# Patient Record
Sex: Female | Born: 1950 | Race: White | Hispanic: No | Smoking: Former smoker
Health system: Southern US, Community
[De-identification: ages and names within clinical notes are randomized; demographics above are authoritative.]

## PROBLEM LIST (undated history)

## (undated) DIAGNOSIS — I10 Essential (primary) hypertension: Secondary | ICD-10-CM

## (undated) DIAGNOSIS — T451X5A Adverse effect of antineoplastic and immunosuppressive drugs, initial encounter: Secondary | ICD-10-CM

## (undated) DIAGNOSIS — C3490 Malignant neoplasm of unspecified part of unspecified bronchus or lung: Secondary | ICD-10-CM

## (undated) DIAGNOSIS — D649 Anemia, unspecified: Secondary | ICD-10-CM

## (undated) DIAGNOSIS — B370 Candidal stomatitis: Secondary | ICD-10-CM

## (undated) DIAGNOSIS — E119 Type 2 diabetes mellitus without complications: Secondary | ICD-10-CM

## (undated) DIAGNOSIS — M797 Fibromyalgia: Secondary | ICD-10-CM

## (undated) DIAGNOSIS — E876 Hypokalemia: Secondary | ICD-10-CM

## (undated) DIAGNOSIS — M47812 Spondylosis without myelopathy or radiculopathy, cervical region: Secondary | ICD-10-CM

## (undated) DIAGNOSIS — Z87891 Personal history of nicotine dependence: Secondary | ICD-10-CM

## (undated) DIAGNOSIS — I639 Cerebral infarction, unspecified: Secondary | ICD-10-CM

## (undated) DIAGNOSIS — Z8701 Personal history of pneumonia (recurrent): Secondary | ICD-10-CM

## (undated) DIAGNOSIS — D6481 Anemia due to antineoplastic chemotherapy: Secondary | ICD-10-CM

## (undated) HISTORY — DX: Hypokalemia: E87.6

## (undated) HISTORY — DX: Personal history of pneumonia (recurrent): Z87.01

## (undated) HISTORY — DX: Malignant neoplasm of unspecified part of unspecified bronchus or lung: C34.90

## (undated) HISTORY — DX: Anemia due to antineoplastic chemotherapy: D64.81

## (undated) HISTORY — DX: Fibromyalgia: M79.7

## (undated) HISTORY — DX: Spondylosis without myelopathy or radiculopathy, cervical region: M47.812

## (undated) HISTORY — DX: Adverse effect of antineoplastic and immunosuppressive drugs, initial encounter: T45.1X5A

## (undated) HISTORY — PX: PORTACATH PLACEMENT: SHX2246

## (undated) HISTORY — DX: Personal history of nicotine dependence: Z87.891

## (undated) HISTORY — DX: Candidal stomatitis: B37.0

## (undated) HISTORY — DX: Anemia, unspecified: D64.9

---

## 1988-01-08 HISTORY — PX: KNEE SURGERY: SHX244

## 1993-01-07 HISTORY — PX: CERVICAL LAMINECTOMY: SHX94

## 1998-10-30 ENCOUNTER — Other Ambulatory Visit: Admission: RE | Admit: 1998-10-30 | Discharge: 1998-10-30 | Payer: Self-pay | Admitting: Obstetrics and Gynecology

## 2005-10-14 ENCOUNTER — Inpatient Hospital Stay (HOSPITAL_COMMUNITY): Admission: EM | Admit: 2005-10-14 | Discharge: 2005-10-23 | Payer: Self-pay | Admitting: Emergency Medicine

## 2005-10-14 ENCOUNTER — Ambulatory Visit: Payer: Self-pay | Admitting: Hospitalist

## 2005-10-16 ENCOUNTER — Ambulatory Visit: Payer: Self-pay | Admitting: Internal Medicine

## 2005-10-16 ENCOUNTER — Encounter (INDEPENDENT_AMBULATORY_CARE_PROVIDER_SITE_OTHER): Payer: Self-pay | Admitting: *Deleted

## 2005-10-17 ENCOUNTER — Encounter: Payer: Self-pay | Admitting: Hospitalist

## 2005-10-17 ENCOUNTER — Ambulatory Visit: Payer: Self-pay | Admitting: Internal Medicine

## 2005-10-18 ENCOUNTER — Encounter: Payer: Self-pay | Admitting: Internal Medicine

## 2005-10-24 ENCOUNTER — Ambulatory Visit: Admission: RE | Admit: 2005-10-24 | Discharge: 2006-01-22 | Payer: Self-pay | Admitting: *Deleted

## 2005-11-04 LAB — CBC WITH DIFFERENTIAL/PLATELET
BASO%: 0.3 % (ref 0.0–2.0)
Basophils Absolute: 0 10*3/uL (ref 0.0–0.1)
EOS%: 0 % (ref 0.0–7.0)
HCT: 37.3 % (ref 34.8–46.6)
HGB: 12.8 g/dL (ref 11.6–15.9)
LYMPH%: 5.5 % — ABNORMAL LOW (ref 14.0–48.0)
MCH: 28.5 pg (ref 26.0–34.0)
MCHC: 34.4 g/dL (ref 32.0–36.0)
MCV: 82.7 fL (ref 81.0–101.0)
MONO%: 5.6 % (ref 0.0–13.0)
NEUT%: 88.6 % — ABNORMAL HIGH (ref 39.6–76.8)

## 2005-11-04 LAB — COMPREHENSIVE METABOLIC PANEL
ALT: 29 U/L (ref 0–40)
AST: 15 U/L (ref 0–37)
Alkaline Phosphatase: 68 U/L (ref 39–117)
BUN: 22 mg/dL (ref 6–23)
Calcium: 9.5 mg/dL (ref 8.4–10.5)
Creatinine, Ser: 0.59 mg/dL (ref 0.40–1.20)
Total Bilirubin: 0.4 mg/dL (ref 0.3–1.2)

## 2005-11-11 LAB — COMPREHENSIVE METABOLIC PANEL
ALT: 30 U/L (ref 0–35)
AST: 13 U/L (ref 0–37)
Calcium: 9.4 mg/dL (ref 8.4–10.5)
Chloride: 100 mEq/L (ref 96–112)
Creatinine, Ser: 0.44 mg/dL (ref 0.40–1.20)
Sodium: 137 mEq/L (ref 135–145)
Total Bilirubin: 0.3 mg/dL (ref 0.3–1.2)
Total Protein: 6.4 g/dL (ref 6.0–8.3)

## 2005-11-11 LAB — CBC WITH DIFFERENTIAL/PLATELET
BASO%: 0.4 % (ref 0.0–2.0)
EOS%: 0 % (ref 0.0–7.0)
HCT: 35.7 % (ref 34.8–46.6)
MCH: 28.3 pg (ref 26.0–34.0)
MCHC: 33.9 g/dL (ref 32.0–36.0)
NEUT%: 84.5 % — ABNORMAL HIGH (ref 39.6–76.8)
RBC: 4.28 10*6/uL (ref 3.70–5.32)
RDW: 16 % — ABNORMAL HIGH (ref 11.3–14.5)
WBC: 11.6 10*3/uL — ABNORMAL HIGH (ref 3.9–10.0)
lymph#: 0.6 10*3/uL — ABNORMAL LOW (ref 0.9–3.3)

## 2005-11-18 LAB — CBC WITH DIFFERENTIAL/PLATELET
BASO%: 0.6 % (ref 0.0–2.0)
EOS%: 0.1 % (ref 0.0–7.0)
HCT: 37.1 % (ref 34.8–46.6)
LYMPH%: 5.1 % — ABNORMAL LOW (ref 14.0–48.0)
MCH: 28.6 pg (ref 26.0–34.0)
MCHC: 33.7 g/dL (ref 32.0–36.0)
NEUT%: 85 % — ABNORMAL HIGH (ref 39.6–76.8)
Platelets: 434 10*3/uL — ABNORMAL HIGH (ref 145–400)

## 2005-11-18 LAB — COMPREHENSIVE METABOLIC PANEL
ALT: 23 U/L (ref 0–35)
AST: 9 U/L (ref 0–37)
Alkaline Phosphatase: 63 U/L (ref 39–117)
Creatinine, Ser: 0.43 mg/dL (ref 0.40–1.20)
Total Bilirubin: 0.2 mg/dL — ABNORMAL LOW (ref 0.3–1.2)

## 2005-12-05 ENCOUNTER — Ambulatory Visit (HOSPITAL_COMMUNITY): Admission: RE | Admit: 2005-12-05 | Discharge: 2005-12-05 | Payer: Self-pay | Admitting: Internal Medicine

## 2005-12-05 ENCOUNTER — Ambulatory Visit: Payer: Self-pay | Admitting: Internal Medicine

## 2005-12-09 LAB — CBC WITH DIFFERENTIAL/PLATELET
Basophils Absolute: 0.1 10*3/uL (ref 0.0–0.1)
EOS%: 0.1 % (ref 0.0–7.0)
HCT: 39.6 % (ref 34.8–46.6)
HGB: 13.5 g/dL (ref 11.6–15.9)
MCH: 30.7 pg (ref 26.0–34.0)
MCHC: 34.1 g/dL (ref 32.0–36.0)
MCV: 89.8 fL (ref 81.0–101.0)
MONO%: 6.9 % (ref 0.0–13.0)
NEUT%: 87.4 % — ABNORMAL HIGH (ref 39.6–76.8)

## 2005-12-09 LAB — COMPREHENSIVE METABOLIC PANEL
AST: 13 U/L (ref 0–37)
Alkaline Phosphatase: 60 U/L (ref 39–117)
BUN: 25 mg/dL — ABNORMAL HIGH (ref 6–23)
Creatinine, Ser: 0.45 mg/dL (ref 0.40–1.20)
Total Bilirubin: 0.3 mg/dL (ref 0.3–1.2)

## 2005-12-12 LAB — CBC WITH DIFFERENTIAL/PLATELET
Basophils Absolute: 0 10*3/uL (ref 0.0–0.1)
EOS%: 0.1 % (ref 0.0–7.0)
HCT: 37.7 % (ref 34.8–46.6)
HGB: 12.6 g/dL (ref 11.6–15.9)
LYMPH%: 6.8 % — ABNORMAL LOW (ref 14.0–48.0)
MCH: 30.1 pg (ref 26.0–34.0)
MCV: 89.7 fL (ref 81.0–101.0)
NEUT%: 88.9 % — ABNORMAL HIGH (ref 39.6–76.8)
Platelets: 283 10*3/uL (ref 145–400)
lymph#: 0.6 10*3/uL — ABNORMAL LOW (ref 0.9–3.3)

## 2005-12-12 LAB — COMPREHENSIVE METABOLIC PANEL
AST: 17 U/L (ref 0–37)
BUN: 19 mg/dL (ref 6–23)
Calcium: 9.4 mg/dL (ref 8.4–10.5)
Chloride: 99 mEq/L (ref 96–112)
Creatinine, Ser: 0.47 mg/dL (ref 0.40–1.20)

## 2005-12-19 LAB — COMPREHENSIVE METABOLIC PANEL
ALT: 28 U/L (ref 0–35)
AST: 12 U/L (ref 0–37)
Albumin: 4.1 g/dL (ref 3.5–5.2)
CO2: 23 mEq/L (ref 19–32)
Calcium: 9.4 mg/dL (ref 8.4–10.5)
Chloride: 99 mEq/L (ref 96–112)
Potassium: 4 mEq/L (ref 3.5–5.3)
Sodium: 136 mEq/L (ref 135–145)
Total Protein: 6.3 g/dL (ref 6.0–8.3)

## 2005-12-19 LAB — CBC WITH DIFFERENTIAL/PLATELET
BASO%: 0.2 % (ref 0.0–2.0)
EOS%: 0 % (ref 0.0–7.0)
HCT: 38.1 % (ref 34.8–46.6)
MCH: 30.7 pg (ref 26.0–34.0)
MCHC: 33.8 g/dL (ref 32.0–36.0)
MONO#: 0.4 10*3/uL (ref 0.1–0.9)
RDW: 23.1 % — ABNORMAL HIGH (ref 11.3–14.5)
WBC: 24.2 10*3/uL — ABNORMAL HIGH (ref 3.9–10.0)
lymph#: 1.1 10*3/uL (ref 0.9–3.3)

## 2005-12-26 ENCOUNTER — Inpatient Hospital Stay (HOSPITAL_COMMUNITY): Admission: EM | Admit: 2005-12-26 | Discharge: 2005-12-30 | Payer: Self-pay | Admitting: Emergency Medicine

## 2005-12-26 ENCOUNTER — Ambulatory Visit: Payer: Self-pay | Admitting: Internal Medicine

## 2005-12-27 ENCOUNTER — Ambulatory Visit: Payer: Self-pay | Admitting: Oncology

## 2006-01-07 HISTORY — PX: OTHER SURGICAL HISTORY: SHX169

## 2006-01-08 ENCOUNTER — Encounter (INDEPENDENT_AMBULATORY_CARE_PROVIDER_SITE_OTHER): Payer: Self-pay | Admitting: Specialist

## 2006-01-08 ENCOUNTER — Ambulatory Visit (HOSPITAL_COMMUNITY): Admission: RE | Admit: 2006-01-08 | Discharge: 2006-01-08 | Payer: Self-pay | Admitting: Internal Medicine

## 2006-01-09 LAB — CBC WITH DIFFERENTIAL/PLATELET
Basophils Absolute: 0.1 10*3/uL (ref 0.0–0.1)
EOS%: 0.2 % (ref 0.0–7.0)
HCT: 30.3 % — ABNORMAL LOW (ref 34.8–46.6)
HGB: 10.3 g/dL — ABNORMAL LOW (ref 11.6–15.9)
LYMPH%: 11.1 % — ABNORMAL LOW (ref 14.0–48.0)
MCH: 29.8 pg (ref 26.0–34.0)
MCV: 87.5 fL (ref 81.0–101.0)
NEUT%: 83.5 % — ABNORMAL HIGH (ref 39.6–76.8)
Platelets: 368 10*3/uL (ref 145–400)
lymph#: 1.8 10*3/uL (ref 0.9–3.3)

## 2006-01-16 LAB — COMPREHENSIVE METABOLIC PANEL
AST: 13 U/L (ref 0–37)
Alkaline Phosphatase: 71 U/L (ref 39–117)
BUN: 5 mg/dL — ABNORMAL LOW (ref 6–23)
Creatinine, Ser: 0.4 mg/dL (ref 0.40–1.20)

## 2006-01-16 LAB — CBC WITH DIFFERENTIAL/PLATELET
Basophils Absolute: 0 10*3/uL (ref 0.0–0.1)
EOS%: 0.4 % (ref 0.0–7.0)
HGB: 9.4 g/dL — ABNORMAL LOW (ref 11.6–15.9)
MCH: 29.9 pg (ref 26.0–34.0)
MCHC: 33.5 g/dL (ref 32.0–36.0)
MCV: 89.2 fL (ref 81.0–101.0)
MONO%: 4.2 % (ref 0.0–13.0)
NEUT%: 80.6 % — ABNORMAL HIGH (ref 39.6–76.8)
RDW: 21.1 % — ABNORMAL HIGH (ref 11.3–14.5)

## 2006-01-20 ENCOUNTER — Inpatient Hospital Stay (HOSPITAL_COMMUNITY): Admission: EM | Admit: 2006-01-20 | Discharge: 2006-01-30 | Payer: Self-pay | Admitting: Emergency Medicine

## 2006-01-20 ENCOUNTER — Ambulatory Visit: Payer: Self-pay | Admitting: Internal Medicine

## 2006-01-20 ENCOUNTER — Ambulatory Visit: Payer: Self-pay | Admitting: Infectious Diseases

## 2006-02-14 ENCOUNTER — Ambulatory Visit: Payer: Self-pay | Admitting: Infectious Diseases

## 2006-02-14 ENCOUNTER — Ambulatory Visit (HOSPITAL_COMMUNITY): Admission: RE | Admit: 2006-02-14 | Discharge: 2006-02-14 | Payer: Self-pay | Admitting: Infectious Diseases

## 2006-02-19 LAB — CBC WITH DIFFERENTIAL/PLATELET
BASO%: 0.2 % (ref 0.0–2.0)
EOS%: 4.6 % (ref 0.0–7.0)
MCH: 29.2 pg (ref 26.0–34.0)
MCHC: 34.6 g/dL (ref 32.0–36.0)
RBC: 3.95 10*6/uL (ref 3.70–5.32)
RDW: 18.4 % — ABNORMAL HIGH (ref 11.3–14.5)
WBC: 12.1 10*3/uL — ABNORMAL HIGH (ref 3.9–10.0)
lymph#: 1.7 10*3/uL (ref 0.9–3.3)

## 2006-02-19 LAB — COMPREHENSIVE METABOLIC PANEL
ALT: 11 U/L (ref 0–35)
AST: 18 U/L (ref 0–37)
Calcium: 9.5 mg/dL (ref 8.4–10.5)
Chloride: 100 mEq/L (ref 96–112)
Creatinine, Ser: 0.4 mg/dL (ref 0.40–1.20)
Potassium: 4.1 mEq/L (ref 3.5–5.3)
Sodium: 138 mEq/L (ref 135–145)
Total Protein: 6.7 g/dL (ref 6.0–8.3)

## 2006-03-20 ENCOUNTER — Ambulatory Visit: Payer: Self-pay | Admitting: Infectious Diseases

## 2006-03-24 ENCOUNTER — Ambulatory Visit: Payer: Self-pay | Admitting: Internal Medicine

## 2006-03-26 LAB — CBC WITH DIFFERENTIAL/PLATELET
BASO%: 0.7 % (ref 0.0–2.0)
EOS%: 3.1 % (ref 0.0–7.0)
HCT: 33.9 % — ABNORMAL LOW (ref 34.8–46.6)
LYMPH%: 17.9 % (ref 14.0–48.0)
MCH: 28.5 pg (ref 26.0–34.0)
MCHC: 34.3 g/dL (ref 32.0–36.0)
NEUT%: 71.1 % (ref 39.6–76.8)
lymph#: 2 10*3/uL (ref 0.9–3.3)

## 2006-03-26 LAB — COMPREHENSIVE METABOLIC PANEL
ALT: 37 U/L — ABNORMAL HIGH (ref 0–35)
AST: 37 U/L (ref 0–37)
Chloride: 103 mEq/L (ref 96–112)
Creatinine, Ser: 0.53 mg/dL (ref 0.40–1.20)
Total Bilirubin: 0.5 mg/dL (ref 0.3–1.2)

## 2006-03-28 ENCOUNTER — Ambulatory Visit (HOSPITAL_COMMUNITY): Admission: RE | Admit: 2006-03-28 | Discharge: 2006-03-28 | Payer: Self-pay | Admitting: Internal Medicine

## 2006-03-31 DIAGNOSIS — D649 Anemia, unspecified: Secondary | ICD-10-CM

## 2006-03-31 DIAGNOSIS — Z9889 Other specified postprocedural states: Secondary | ICD-10-CM

## 2006-03-31 DIAGNOSIS — Z8709 Personal history of other diseases of the respiratory system: Secondary | ICD-10-CM | POA: Insufficient documentation

## 2006-03-31 DIAGNOSIS — Z85118 Personal history of other malignant neoplasm of bronchus and lung: Secondary | ICD-10-CM | POA: Insufficient documentation

## 2006-06-23 ENCOUNTER — Ambulatory Visit: Payer: Self-pay | Admitting: Internal Medicine

## 2006-06-25 LAB — CBC WITH DIFFERENTIAL/PLATELET
Basophils Absolute: 0 10*3/uL (ref 0.0–0.1)
EOS%: 1.2 % (ref 0.0–7.0)
HCT: 36.1 % (ref 34.8–46.6)
HGB: 12.5 g/dL (ref 11.6–15.9)
MCH: 27.4 pg (ref 26.0–34.0)
MCV: 79.3 fL — ABNORMAL LOW (ref 81.0–101.0)
MONO%: 10.4 % (ref 0.0–13.0)
NEUT%: 60.4 % (ref 39.6–76.8)
Platelets: 490 10*3/uL — ABNORMAL HIGH (ref 145–400)

## 2006-06-25 LAB — COMPREHENSIVE METABOLIC PANEL
Albumin: 4.4 g/dL (ref 3.5–5.2)
BUN: 19 mg/dL (ref 6–23)
CO2: 24 mEq/L (ref 19–32)
Calcium: 9.9 mg/dL (ref 8.4–10.5)
Glucose, Bld: 70 mg/dL (ref 70–99)
Potassium: 4.3 mEq/L (ref 3.5–5.3)
Sodium: 140 mEq/L (ref 135–145)
Total Protein: 7.2 g/dL (ref 6.0–8.3)

## 2006-06-27 ENCOUNTER — Ambulatory Visit (HOSPITAL_COMMUNITY): Admission: RE | Admit: 2006-06-27 | Discharge: 2006-06-27 | Payer: Self-pay | Admitting: Internal Medicine

## 2006-07-04 ENCOUNTER — Ambulatory Visit (HOSPITAL_COMMUNITY): Admission: RE | Admit: 2006-07-04 | Discharge: 2006-07-04 | Payer: Self-pay | Admitting: Internal Medicine

## 2006-07-08 HISTORY — PX: KYPHOSIS SURGERY: SHX114

## 2006-07-15 ENCOUNTER — Encounter: Payer: Self-pay | Admitting: Internal Medicine

## 2006-07-23 ENCOUNTER — Ambulatory Visit (HOSPITAL_COMMUNITY): Admission: RE | Admit: 2006-07-23 | Discharge: 2006-07-23 | Payer: Self-pay | Admitting: Interventional Radiology

## 2006-07-23 ENCOUNTER — Encounter (INDEPENDENT_AMBULATORY_CARE_PROVIDER_SITE_OTHER): Payer: Self-pay | Admitting: Interventional Radiology

## 2006-09-30 ENCOUNTER — Ambulatory Visit: Payer: Self-pay | Admitting: Internal Medicine

## 2006-10-02 LAB — COMPREHENSIVE METABOLIC PANEL
Albumin: 4.3 g/dL (ref 3.5–5.2)
Alkaline Phosphatase: 114 U/L (ref 39–117)
CO2: 28 mEq/L (ref 19–32)
Calcium: 9.5 mg/dL (ref 8.4–10.5)
Chloride: 108 mEq/L (ref 96–112)
Glucose, Bld: 125 mg/dL — ABNORMAL HIGH (ref 70–99)
Potassium: 4 mEq/L (ref 3.5–5.3)
Sodium: 145 mEq/L (ref 135–145)
Total Protein: 7.1 g/dL (ref 6.0–8.3)

## 2006-10-02 LAB — CBC WITH DIFFERENTIAL/PLATELET
Eosinophils Absolute: 0.1 10*3/uL (ref 0.0–0.5)
HGB: 12.7 g/dL (ref 11.6–15.9)
MONO#: 0.6 10*3/uL (ref 0.1–0.9)
MONO%: 7.3 % (ref 0.0–13.0)
NEUT#: 5.1 10*3/uL (ref 1.5–6.5)
RBC: 4.49 10*6/uL (ref 3.70–5.32)
RDW: 14.1 % (ref 11.3–14.5)
WBC: 8.4 10*3/uL (ref 3.9–10.0)
lymph#: 2.6 10*3/uL (ref 0.9–3.3)

## 2006-10-06 ENCOUNTER — Ambulatory Visit (HOSPITAL_COMMUNITY): Admission: RE | Admit: 2006-10-06 | Discharge: 2006-10-06 | Payer: Self-pay | Admitting: Internal Medicine

## 2006-10-08 ENCOUNTER — Encounter (INDEPENDENT_AMBULATORY_CARE_PROVIDER_SITE_OTHER): Payer: Self-pay | Admitting: Hospitalist

## 2006-10-09 ENCOUNTER — Ambulatory Visit (HOSPITAL_COMMUNITY): Admission: RE | Admit: 2006-10-09 | Discharge: 2006-10-09 | Payer: Self-pay | Admitting: Internal Medicine

## 2007-01-02 ENCOUNTER — Ambulatory Visit: Payer: Self-pay | Admitting: Internal Medicine

## 2007-01-06 LAB — CBC WITH DIFFERENTIAL/PLATELET
EOS%: 1.1 % (ref 0.0–7.0)
Eosinophils Absolute: 0.1 10*3/uL (ref 0.0–0.5)
MCV: 83.7 fL (ref 81.0–101.0)
MONO%: 7 % (ref 0.0–13.0)
NEUT#: 6 10*3/uL (ref 1.5–6.5)
RBC: 4.55 10*6/uL (ref 3.70–5.32)
RDW: 13.4 % (ref 11.3–14.5)

## 2007-01-06 LAB — COMPREHENSIVE METABOLIC PANEL
AST: 16 U/L (ref 0–37)
Albumin: 4.4 g/dL (ref 3.5–5.2)
Alkaline Phosphatase: 119 U/L — ABNORMAL HIGH (ref 39–117)
Potassium: 4 mEq/L (ref 3.5–5.3)
Sodium: 142 mEq/L (ref 135–145)
Total Protein: 7.1 g/dL (ref 6.0–8.3)

## 2007-01-12 ENCOUNTER — Ambulatory Visit (HOSPITAL_COMMUNITY): Admission: RE | Admit: 2007-01-12 | Discharge: 2007-01-12 | Payer: Self-pay | Admitting: Internal Medicine

## 2007-01-19 ENCOUNTER — Ambulatory Visit (HOSPITAL_COMMUNITY): Admission: RE | Admit: 2007-01-19 | Discharge: 2007-01-19 | Payer: Self-pay | Admitting: Internal Medicine

## 2007-02-09 LAB — CBC WITH DIFFERENTIAL/PLATELET
BASO%: 0.4 % (ref 0.0–2.0)
Eosinophils Absolute: 0.2 10*3/uL (ref 0.0–0.5)
MCHC: 32.7 g/dL (ref 32.0–36.0)
MONO#: 0.7 10*3/uL (ref 0.1–0.9)
MONO%: 6.8 % (ref 0.0–13.0)
NEUT#: 7.3 10*3/uL — ABNORMAL HIGH (ref 1.5–6.5)
RBC: 5.02 10*6/uL (ref 3.70–5.32)
RDW: 13 % (ref 11.3–14.5)
WBC: 9.9 10*3/uL (ref 3.9–10.0)

## 2007-02-09 LAB — COMPREHENSIVE METABOLIC PANEL
ALT: 13 U/L (ref 0–35)
Albumin: 4.6 g/dL (ref 3.5–5.2)
Alkaline Phosphatase: 130 U/L — ABNORMAL HIGH (ref 39–117)
CO2: 26 mEq/L (ref 19–32)
Glucose, Bld: 126 mg/dL — ABNORMAL HIGH (ref 70–99)
Potassium: 4.2 mEq/L (ref 3.5–5.3)
Sodium: 141 mEq/L (ref 135–145)
Total Bilirubin: 0.4 mg/dL (ref 0.3–1.2)
Total Protein: 7.3 g/dL (ref 6.0–8.3)

## 2007-02-12 ENCOUNTER — Ambulatory Visit: Admission: RE | Admit: 2007-02-12 | Discharge: 2007-04-10 | Payer: Self-pay | Admitting: Radiation Oncology

## 2007-02-24 ENCOUNTER — Encounter (INDEPENDENT_AMBULATORY_CARE_PROVIDER_SITE_OTHER): Payer: Self-pay | Admitting: Interventional Radiology

## 2007-02-24 ENCOUNTER — Ambulatory Visit (HOSPITAL_COMMUNITY): Admission: RE | Admit: 2007-02-24 | Discharge: 2007-02-24 | Payer: Self-pay | Admitting: Internal Medicine

## 2007-02-26 ENCOUNTER — Ambulatory Visit: Payer: Self-pay | Admitting: Internal Medicine

## 2007-02-26 ENCOUNTER — Encounter: Payer: Self-pay | Admitting: Infectious Diseases

## 2007-02-26 LAB — CBC WITH DIFFERENTIAL/PLATELET
BASO%: 0.3 % (ref 0.0–2.0)
EOS%: 1.5 % (ref 0.0–7.0)
Eosinophils Absolute: 0.2 10*3/uL (ref 0.0–0.5)
LYMPH%: 16.3 % (ref 14.0–48.0)
MCH: 27.9 pg (ref 26.0–34.0)
MCHC: 34.3 g/dL (ref 32.0–36.0)
MCV: 81.3 fL (ref 81.0–101.0)
MONO%: 6.8 % (ref 0.0–13.0)
NEUT#: 9.2 10*3/uL — ABNORMAL HIGH (ref 1.5–6.5)
Platelets: 506 10*3/uL — ABNORMAL HIGH (ref 145–400)
RBC: 4.57 10*6/uL (ref 3.70–5.32)
RDW: 13 % (ref 11.3–14.5)

## 2007-02-26 LAB — COMPREHENSIVE METABOLIC PANEL
ALT: 32 U/L (ref 0–35)
AST: 29 U/L (ref 0–37)
Albumin: 4.3 g/dL (ref 3.5–5.2)
Alkaline Phosphatase: 146 U/L — ABNORMAL HIGH (ref 39–117)
BUN: 10 mg/dL (ref 6–23)
Creatinine, Ser: 0.56 mg/dL (ref 0.40–1.20)
Potassium: 4.6 mEq/L (ref 3.5–5.3)

## 2007-03-26 ENCOUNTER — Ambulatory Visit (HOSPITAL_COMMUNITY): Admission: RE | Admit: 2007-03-26 | Discharge: 2007-03-26 | Payer: Self-pay | Admitting: Internal Medicine

## 2007-03-26 ENCOUNTER — Ambulatory Visit: Payer: Self-pay | Admitting: Internal Medicine

## 2007-03-26 LAB — COMPREHENSIVE METABOLIC PANEL
Albumin: 3.9 g/dL (ref 3.5–5.2)
CO2: 27 mEq/L (ref 19–32)
Calcium: 9.9 mg/dL (ref 8.4–10.5)
Glucose, Bld: 117 mg/dL — ABNORMAL HIGH (ref 70–99)
Sodium: 139 mEq/L (ref 135–145)
Total Bilirubin: 0.5 mg/dL (ref 0.3–1.2)
Total Protein: 7.8 g/dL (ref 6.0–8.3)

## 2007-03-26 LAB — CBC WITH DIFFERENTIAL/PLATELET
Basophils Absolute: 0 10*3/uL (ref 0.0–0.1)
EOS%: 0.4 % (ref 0.0–7.0)
HGB: 12.9 g/dL (ref 11.6–15.9)
MCH: 27.2 pg (ref 26.0–34.0)
MONO#: 0.8 10*3/uL (ref 0.1–0.9)
NEUT#: 6.1 10*3/uL (ref 1.5–6.5)
RDW: 13.4 % (ref 11.3–14.5)
WBC: 8.9 10*3/uL (ref 3.9–10.0)
lymph#: 1.9 10*3/uL (ref 0.9–3.3)

## 2007-03-31 ENCOUNTER — Encounter: Payer: Self-pay | Admitting: Infectious Diseases

## 2007-04-07 LAB — COMPREHENSIVE METABOLIC PANEL
ALT: 24 U/L (ref 0–35)
Albumin: 4.6 g/dL (ref 3.5–5.2)
CO2: 18 mEq/L — ABNORMAL LOW (ref 19–32)
Chloride: 108 mEq/L (ref 96–112)
Glucose, Bld: 160 mg/dL — ABNORMAL HIGH (ref 70–99)
Potassium: 4.2 mEq/L (ref 3.5–5.3)
Sodium: 143 mEq/L (ref 135–145)
Total Protein: 7.5 g/dL (ref 6.0–8.3)

## 2007-04-07 LAB — CBC WITH DIFFERENTIAL/PLATELET
Eosinophils Absolute: 0 10*3/uL (ref 0.0–0.5)
MONO#: 0.7 10*3/uL (ref 0.1–0.9)
NEUT#: 15.1 10*3/uL — ABNORMAL HIGH (ref 1.5–6.5)
Platelets: 434 10*3/uL — ABNORMAL HIGH (ref 145–400)
RBC: 5.08 10*6/uL (ref 3.70–5.32)
RDW: 14 % (ref 11.3–14.5)
WBC: 17.3 10*3/uL — ABNORMAL HIGH (ref 3.9–10.0)
lymph#: 1.5 10*3/uL (ref 0.9–3.3)

## 2007-04-14 LAB — COMPREHENSIVE METABOLIC PANEL
Albumin: 4.8 g/dL (ref 3.5–5.2)
Alkaline Phosphatase: 92 U/L (ref 39–117)
BUN: 18 mg/dL (ref 6–23)
Creatinine, Ser: 0.73 mg/dL (ref 0.40–1.20)
Glucose, Bld: 143 mg/dL — ABNORMAL HIGH (ref 70–99)
Total Bilirubin: 1.1 mg/dL (ref 0.3–1.2)

## 2007-04-14 LAB — CBC WITH DIFFERENTIAL/PLATELET
Basophils Absolute: 0 10*3/uL (ref 0.0–0.1)
Eosinophils Absolute: 0 10*3/uL (ref 0.0–0.5)
HGB: 13.6 g/dL (ref 11.6–15.9)
LYMPH%: 21.4 % (ref 14.0–48.0)
MCV: 79.5 fL — ABNORMAL LOW (ref 81.0–101.0)
MONO%: 7.2 % (ref 0.0–13.0)
NEUT#: 5.6 10*3/uL (ref 1.5–6.5)
NEUT%: 70.4 % (ref 39.6–76.8)
Platelets: 370 10*3/uL (ref 145–400)

## 2007-04-21 LAB — CBC WITH DIFFERENTIAL/PLATELET
Basophils Absolute: 0 10*3/uL (ref 0.0–0.1)
Eosinophils Absolute: 0 10*3/uL (ref 0.0–0.5)
HGB: 12.4 g/dL (ref 11.6–15.9)
MCV: 79.6 fL — ABNORMAL LOW (ref 81.0–101.0)
MONO%: 13.2 % — ABNORMAL HIGH (ref 0.0–13.0)
NEUT#: 2.5 10*3/uL (ref 1.5–6.5)
Platelets: 228 10*3/uL (ref 145–400)
RDW: 15.1 % — ABNORMAL HIGH (ref 11.3–14.5)

## 2007-04-21 LAB — COMPREHENSIVE METABOLIC PANEL
Albumin: 4.3 g/dL (ref 3.5–5.2)
Alkaline Phosphatase: 126 U/L — ABNORMAL HIGH (ref 39–117)
BUN: 15 mg/dL (ref 6–23)
Calcium: 9.7 mg/dL (ref 8.4–10.5)
Glucose, Bld: 95 mg/dL (ref 70–99)
Potassium: 4.1 mEq/L (ref 3.5–5.3)

## 2007-04-28 LAB — COMPREHENSIVE METABOLIC PANEL
Albumin: 4.8 g/dL (ref 3.5–5.2)
Alkaline Phosphatase: 147 U/L — ABNORMAL HIGH (ref 39–117)
BUN: 12 mg/dL (ref 6–23)
Calcium: 10.6 mg/dL — ABNORMAL HIGH (ref 8.4–10.5)
Chloride: 106 mEq/L (ref 96–112)
Creatinine, Ser: 0.5 mg/dL (ref 0.40–1.20)
Glucose, Bld: 134 mg/dL — ABNORMAL HIGH (ref 70–99)
Potassium: 4.3 mEq/L (ref 3.5–5.3)

## 2007-04-28 LAB — CBC WITH DIFFERENTIAL/PLATELET
BASO%: 0.6 % (ref 0.0–2.0)
LYMPH%: 12.1 % — ABNORMAL LOW (ref 14.0–48.0)
MCHC: 33.7 g/dL (ref 32.0–36.0)
MONO#: 1.9 10*3/uL — ABNORMAL HIGH (ref 0.1–0.9)
Platelets: 651 10*3/uL — ABNORMAL HIGH (ref 145–400)
RBC: 4.6 10*6/uL (ref 3.70–5.32)
WBC: 17.6 10*3/uL — ABNORMAL HIGH (ref 3.9–10.0)
lymph#: 2.1 10*3/uL (ref 0.9–3.3)

## 2007-05-05 LAB — COMPREHENSIVE METABOLIC PANEL
Albumin: 4.1 g/dL (ref 3.5–5.2)
BUN: 14 mg/dL (ref 6–23)
CO2: 23 mEq/L (ref 19–32)
Calcium: 9.4 mg/dL (ref 8.4–10.5)
Chloride: 101 mEq/L (ref 96–112)
Glucose, Bld: 91 mg/dL (ref 70–99)
Potassium: 4.1 mEq/L (ref 3.5–5.3)

## 2007-05-05 LAB — CBC WITH DIFFERENTIAL/PLATELET
Basophils Absolute: 0 10*3/uL (ref 0.0–0.1)
Eosinophils Absolute: 0 10*3/uL (ref 0.0–0.5)
HCT: 35 % (ref 34.8–46.6)
HGB: 12.1 g/dL (ref 11.6–15.9)
NEUT#: 2.8 10*3/uL (ref 1.5–6.5)
NEUT%: 57.6 % (ref 39.6–76.8)
RDW: 17.5 % — ABNORMAL HIGH (ref 11.3–14.5)
lymph#: 1.7 10*3/uL (ref 0.9–3.3)

## 2007-05-12 ENCOUNTER — Ambulatory Visit: Payer: Self-pay | Admitting: Internal Medicine

## 2007-05-19 ENCOUNTER — Encounter: Payer: Self-pay | Admitting: Infectious Diseases

## 2007-05-19 LAB — CBC WITH DIFFERENTIAL/PLATELET
Basophils Absolute: 0.1 10*3/uL (ref 0.0–0.1)
EOS%: 0 % (ref 0.0–7.0)
Eosinophils Absolute: 0 10*3/uL (ref 0.0–0.5)
LYMPH%: 17.3 % (ref 14.0–48.0)
MCH: 28.1 pg (ref 26.0–34.0)
MCV: 83.1 fL (ref 81.0–101.0)
MONO%: 16 % — ABNORMAL HIGH (ref 0.0–13.0)
NEUT#: 4.9 10*3/uL (ref 1.5–6.5)
Platelets: 709 10*3/uL — ABNORMAL HIGH (ref 145–400)
RBC: 4.22 10*6/uL (ref 3.70–5.32)

## 2007-05-19 LAB — COMPREHENSIVE METABOLIC PANEL
AST: 13 U/L (ref 0–37)
Alkaline Phosphatase: 111 U/L (ref 39–117)
BUN: 15 mg/dL (ref 6–23)
Glucose, Bld: 152 mg/dL — ABNORMAL HIGH (ref 70–99)
Sodium: 139 mEq/L (ref 135–145)
Total Bilirubin: 0.3 mg/dL (ref 0.3–1.2)

## 2007-05-20 ENCOUNTER — Encounter: Payer: Self-pay | Admitting: Infectious Diseases

## 2007-06-04 ENCOUNTER — Ambulatory Visit (HOSPITAL_COMMUNITY): Admission: RE | Admit: 2007-06-04 | Discharge: 2007-06-04 | Payer: Self-pay | Admitting: Internal Medicine

## 2007-06-09 LAB — COMPREHENSIVE METABOLIC PANEL
ALT: 30 U/L (ref 0–35)
AST: 20 U/L (ref 0–37)
Alkaline Phosphatase: 95 U/L (ref 39–117)
BUN: 11 mg/dL (ref 6–23)
Creatinine, Ser: 0.49 mg/dL (ref 0.40–1.20)
Total Bilirubin: 0.4 mg/dL (ref 0.3–1.2)

## 2007-06-09 LAB — CBC WITH DIFFERENTIAL/PLATELET
BASO%: 1.5 % (ref 0.0–2.0)
Basophils Absolute: 0.1 10*3/uL (ref 0.0–0.1)
HCT: 33.9 % — ABNORMAL LOW (ref 34.8–46.6)
HGB: 11.7 g/dL (ref 11.6–15.9)
LYMPH%: 15.7 % (ref 14.0–48.0)
MCH: 29.6 pg (ref 26.0–34.0)
MCHC: 34.4 g/dL (ref 32.0–36.0)
MONO#: 0.9 10*3/uL (ref 0.1–0.9)
NEUT%: 67.6 % (ref 39.6–76.8)
Platelets: 572 10*3/uL — ABNORMAL HIGH (ref 145–400)
WBC: 6 10*3/uL (ref 3.9–10.0)

## 2007-06-16 LAB — CBC WITH DIFFERENTIAL/PLATELET
BASO%: 0.3 % (ref 0.0–2.0)
Basophils Absolute: 0 10*3/uL (ref 0.0–0.1)
EOS%: 0.2 % (ref 0.0–7.0)
HCT: 34.7 % — ABNORMAL LOW (ref 34.8–46.6)
LYMPH%: 34 % (ref 14.0–48.0)
MCH: 30.2 pg (ref 26.0–34.0)
MCHC: 35 g/dL (ref 32.0–36.0)
MCV: 86.1 fL (ref 81.0–101.0)
MONO%: 8.4 % (ref 0.0–13.0)
NEUT%: 57.1 % (ref 39.6–76.8)
lymph#: 2.1 10*3/uL (ref 0.9–3.3)

## 2007-06-16 LAB — COMPREHENSIVE METABOLIC PANEL
ALT: 19 U/L (ref 0–35)
AST: 18 U/L (ref 0–37)
Alkaline Phosphatase: 78 U/L (ref 39–117)
BUN: 15 mg/dL (ref 6–23)
Chloride: 105 mEq/L (ref 96–112)
Creatinine, Ser: 0.61 mg/dL (ref 0.40–1.20)
Total Bilirubin: 0.4 mg/dL (ref 0.3–1.2)

## 2007-06-23 LAB — COMPREHENSIVE METABOLIC PANEL
ALT: 28 U/L (ref 0–35)
AST: 25 U/L (ref 0–37)
BUN: 9 mg/dL (ref 6–23)
CO2: 26 mEq/L (ref 19–32)
Calcium: 9.5 mg/dL (ref 8.4–10.5)
Chloride: 106 mEq/L (ref 96–112)
Creatinine, Ser: 0.57 mg/dL (ref 0.40–1.20)
Total Bilirubin: 0.6 mg/dL (ref 0.3–1.2)

## 2007-06-23 LAB — CBC WITH DIFFERENTIAL/PLATELET
BASO%: 0.3 % (ref 0.0–2.0)
Basophils Absolute: 0 10*3/uL (ref 0.0–0.1)
EOS%: 0.1 % (ref 0.0–7.0)
HCT: 30.7 % — ABNORMAL LOW (ref 34.8–46.6)
HGB: 10.8 g/dL — ABNORMAL LOW (ref 11.6–15.9)
LYMPH%: 27.7 % (ref 14.0–48.0)
MCH: 30.4 pg (ref 26.0–34.0)
MCHC: 35.3 g/dL (ref 32.0–36.0)
NEUT%: 61.6 % (ref 39.6–76.8)
Platelets: 91 10*3/uL — ABNORMAL LOW (ref 145–400)
lymph#: 1.2 10*3/uL (ref 0.9–3.3)

## 2007-06-30 ENCOUNTER — Ambulatory Visit: Payer: Self-pay | Admitting: Internal Medicine

## 2007-07-14 LAB — CBC WITH DIFFERENTIAL/PLATELET
Eosinophils Absolute: 0 10*3/uL (ref 0.0–0.5)
HCT: 29.5 % — ABNORMAL LOW (ref 34.8–46.6)
LYMPH%: 35.4 % (ref 14.0–48.0)
MCV: 90.6 fL (ref 81.0–101.0)
MONO#: 0.5 10*3/uL (ref 0.1–0.9)
MONO%: 13.6 % — ABNORMAL HIGH (ref 0.0–13.0)
NEUT#: 1.8 10*3/uL (ref 1.5–6.5)
NEUT%: 50.6 % (ref 39.6–76.8)
Platelets: 118 10*3/uL — ABNORMAL LOW (ref 145–400)
RBC: 3.26 10*6/uL — ABNORMAL LOW (ref 3.70–5.32)
WBC: 3.5 10*3/uL — ABNORMAL LOW (ref 3.9–10.0)

## 2007-07-14 LAB — COMPREHENSIVE METABOLIC PANEL
Alkaline Phosphatase: 89 U/L (ref 39–117)
BUN: 16 mg/dL (ref 6–23)
CO2: 26 mEq/L (ref 19–32)
Creatinine, Ser: 0.63 mg/dL (ref 0.40–1.20)
Glucose, Bld: 97 mg/dL (ref 70–99)
Total Bilirubin: 0.6 mg/dL (ref 0.3–1.2)

## 2007-07-21 LAB — COMPREHENSIVE METABOLIC PANEL WITH GFR
ALT: 22 U/L (ref 0–35)
AST: 19 U/L (ref 0–37)
Albumin: 4.9 g/dL (ref 3.5–5.2)
Alkaline Phosphatase: 99 U/L (ref 39–117)
BUN: 10 mg/dL (ref 6–23)
CO2: 21 meq/L (ref 19–32)
Calcium: 10.3 mg/dL (ref 8.4–10.5)
Chloride: 103 meq/L (ref 96–112)
Creatinine, Ser: 0.57 mg/dL (ref 0.40–1.20)
Glucose, Bld: 143 mg/dL — ABNORMAL HIGH (ref 70–99)
Potassium: 3.8 meq/L (ref 3.5–5.3)
Sodium: 139 meq/L (ref 135–145)
Total Bilirubin: 0.5 mg/dL (ref 0.3–1.2)
Total Protein: 7.6 g/dL (ref 6.0–8.3)

## 2007-07-21 LAB — CBC WITH DIFFERENTIAL/PLATELET
BASO%: 0.8 % (ref 0.0–2.0)
Basophils Absolute: 0.1 10*3/uL (ref 0.0–0.1)
EOS%: 0.1 % (ref 0.0–7.0)
HGB: 12.4 g/dL (ref 11.6–15.9)
MCH: 31.4 pg (ref 26.0–34.0)
MONO%: 8.4 % (ref 0.0–13.0)
RBC: 3.95 10*6/uL (ref 3.70–5.32)
RDW: 17.4 % — ABNORMAL HIGH (ref 11.3–14.5)
lymph#: 1 10*3/uL (ref 0.9–3.3)

## 2007-07-28 LAB — CBC WITH DIFFERENTIAL/PLATELET
Basophils Absolute: 0 10*3/uL (ref 0.0–0.1)
Eosinophils Absolute: 0 10*3/uL (ref 0.0–0.5)
HGB: 11.6 g/dL (ref 11.6–15.9)
MCV: 91.4 fL (ref 81.0–101.0)
MONO#: 0.5 10*3/uL (ref 0.1–0.9)
NEUT#: 2.2 10*3/uL (ref 1.5–6.5)
RBC: 3.54 10*6/uL — ABNORMAL LOW (ref 3.70–5.32)
RDW: 18 % — ABNORMAL HIGH (ref 11.3–14.5)
WBC: 4.3 10*3/uL (ref 3.9–10.0)
lymph#: 1.6 10*3/uL (ref 0.9–3.3)

## 2007-07-28 LAB — COMPREHENSIVE METABOLIC PANEL
Albumin: 4.4 g/dL (ref 3.5–5.2)
Alkaline Phosphatase: 78 U/L (ref 39–117)
BUN: 12 mg/dL (ref 6–23)
CO2: 27 mEq/L (ref 19–32)
Calcium: 9.6 mg/dL (ref 8.4–10.5)
Chloride: 102 mEq/L (ref 96–112)
Glucose, Bld: 71 mg/dL (ref 70–99)
Potassium: 4.6 mEq/L (ref 3.5–5.3)
Sodium: 139 mEq/L (ref 135–145)
Total Protein: 6.3 g/dL (ref 6.0–8.3)

## 2007-08-04 LAB — CBC WITH DIFFERENTIAL/PLATELET
Basophils Absolute: 0 10*3/uL (ref 0.0–0.1)
EOS%: 0 % (ref 0.0–7.0)
Eosinophils Absolute: 0 10*3/uL (ref 0.0–0.5)
HGB: 11.3 g/dL — ABNORMAL LOW (ref 11.6–15.9)
MCV: 93.2 fL (ref 81.0–101.0)
MONO%: 10.5 % (ref 0.0–13.0)
NEUT#: 2.5 10*3/uL (ref 1.5–6.5)
RBC: 3.5 10*6/uL — ABNORMAL LOW (ref 3.70–5.32)
RDW: 18.1 % — ABNORMAL HIGH (ref 11.3–14.5)
lymph#: 1 10*3/uL (ref 0.9–3.3)

## 2007-08-04 LAB — COMPREHENSIVE METABOLIC PANEL
AST: 31 U/L (ref 0–37)
Albumin: 4.5 g/dL (ref 3.5–5.2)
Alkaline Phosphatase: 91 U/L (ref 39–117)
Calcium: 9.4 mg/dL (ref 8.4–10.5)
Chloride: 108 mEq/L (ref 96–112)
Potassium: 3.9 mEq/L (ref 3.5–5.3)
Sodium: 142 mEq/L (ref 135–145)
Total Protein: 6.9 g/dL (ref 6.0–8.3)

## 2007-08-07 ENCOUNTER — Ambulatory Visit (HOSPITAL_COMMUNITY): Admission: RE | Admit: 2007-08-07 | Discharge: 2007-08-07 | Payer: Self-pay | Admitting: Internal Medicine

## 2007-08-11 LAB — COMPREHENSIVE METABOLIC PANEL
AST: 23 U/L (ref 0–37)
Albumin: 4.8 g/dL (ref 3.5–5.2)
Alkaline Phosphatase: 91 U/L (ref 39–117)
BUN: 12 mg/dL (ref 6–23)
Creatinine, Ser: 0.61 mg/dL (ref 0.40–1.20)
Glucose, Bld: 179 mg/dL — ABNORMAL HIGH (ref 70–99)
Potassium: 4.8 mEq/L (ref 3.5–5.3)
Total Bilirubin: 0.4 mg/dL (ref 0.3–1.2)

## 2007-08-11 LAB — CBC WITH DIFFERENTIAL/PLATELET
Basophils Absolute: 0.1 10*3/uL (ref 0.0–0.1)
EOS%: 0.3 % (ref 0.0–7.0)
HGB: 12.6 g/dL (ref 11.6–15.9)
LYMPH%: 14.5 % (ref 14.0–48.0)
MCH: 32.4 pg (ref 26.0–34.0)
MCV: 92.1 fL (ref 81.0–101.0)
MONO%: 5.9 % (ref 0.0–13.0)
Platelets: 450 10*3/uL — ABNORMAL HIGH (ref 145–400)
RDW: 16.8 % — ABNORMAL HIGH (ref 11.3–14.5)

## 2007-08-31 ENCOUNTER — Ambulatory Visit: Payer: Self-pay | Admitting: Internal Medicine

## 2007-09-01 LAB — CBC WITH DIFFERENTIAL/PLATELET
BASO%: 0.3 % (ref 0.0–2.0)
HCT: 35.6 % (ref 34.8–46.6)
LYMPH%: 9.6 % — ABNORMAL LOW (ref 14.0–48.0)
MCHC: 34 g/dL (ref 32.0–36.0)
MCV: 92.4 fL (ref 81.0–101.0)
MONO%: 8.7 % (ref 0.0–13.0)
NEUT%: 81.4 % — ABNORMAL HIGH (ref 39.6–76.8)
Platelets: 373 10*3/uL (ref 145–400)
RBC: 3.86 10*6/uL (ref 3.70–5.32)

## 2007-09-01 LAB — COMPREHENSIVE METABOLIC PANEL
ALT: 26 U/L (ref 0–35)
Alkaline Phosphatase: 92 U/L (ref 39–117)
CO2: 22 mEq/L (ref 19–32)
Creatinine, Ser: 0.53 mg/dL (ref 0.40–1.20)
Glucose, Bld: 170 mg/dL — ABNORMAL HIGH (ref 70–99)
Sodium: 141 mEq/L (ref 135–145)
Total Bilirubin: 0.4 mg/dL (ref 0.3–1.2)
Total Protein: 7.4 g/dL (ref 6.0–8.3)

## 2007-09-22 ENCOUNTER — Ambulatory Visit: Payer: Self-pay | Admitting: Internal Medicine

## 2007-09-22 LAB — CBC WITH DIFFERENTIAL/PLATELET
EOS%: 0 % (ref 0.0–7.0)
LYMPH%: 8.6 % — ABNORMAL LOW (ref 14.0–48.0)
MCH: 30.4 pg (ref 26.0–34.0)
MCHC: 34.2 g/dL (ref 32.0–36.0)
MCV: 89 fL (ref 81.0–101.0)
MONO%: 7.7 % (ref 0.0–13.0)
RBC: 3.98 10*6/uL (ref 3.70–5.32)
RDW: 13.6 % (ref 11.3–14.5)

## 2007-09-22 LAB — COMPREHENSIVE METABOLIC PANEL
AST: 14 U/L (ref 0–37)
Albumin: 4.6 g/dL (ref 3.5–5.2)
Alkaline Phosphatase: 80 U/L (ref 39–117)
Potassium: 4.6 mEq/L (ref 3.5–5.3)
Sodium: 142 mEq/L (ref 135–145)
Total Bilirubin: 0.4 mg/dL (ref 0.3–1.2)
Total Protein: 7.5 g/dL (ref 6.0–8.3)

## 2007-10-08 ENCOUNTER — Ambulatory Visit (HOSPITAL_COMMUNITY): Admission: RE | Admit: 2007-10-08 | Discharge: 2007-10-08 | Payer: Self-pay | Admitting: Internal Medicine

## 2007-10-12 LAB — CBC WITH DIFFERENTIAL/PLATELET
EOS%: 0.5 % (ref 0.0–7.0)
MCH: 30.2 pg (ref 26.0–34.0)
MCHC: 34 g/dL (ref 32.0–36.0)
MCV: 88.6 fL (ref 81.0–101.0)
MONO%: 10.6 % (ref 0.0–13.0)
RBC: 4.29 10*6/uL (ref 3.70–5.32)
RDW: 16.3 % — ABNORMAL HIGH (ref 11.3–14.5)

## 2007-10-12 LAB — COMPREHENSIVE METABOLIC PANEL
AST: 16 U/L (ref 0–37)
Albumin: 4.4 g/dL (ref 3.5–5.2)
Alkaline Phosphatase: 85 U/L (ref 39–117)
Potassium: 4.3 mEq/L (ref 3.5–5.3)
Sodium: 141 mEq/L (ref 135–145)
Total Protein: 7 g/dL (ref 6.0–8.3)

## 2007-10-27 ENCOUNTER — Encounter: Payer: Self-pay | Admitting: Infectious Diseases

## 2007-11-03 LAB — COMPREHENSIVE METABOLIC PANEL
ALT: 21 U/L (ref 0–35)
AST: 19 U/L (ref 0–37)
Albumin: 4.6 g/dL (ref 3.5–5.2)
Alkaline Phosphatase: 74 U/L (ref 39–117)
Glucose, Bld: 142 mg/dL — ABNORMAL HIGH (ref 70–99)
Potassium: 3.8 mEq/L (ref 3.5–5.3)
Sodium: 139 mEq/L (ref 135–145)
Total Protein: 7.4 g/dL (ref 6.0–8.3)

## 2007-11-03 LAB — CBC WITH DIFFERENTIAL/PLATELET
BASO%: 0.5 % (ref 0.0–2.0)
EOS%: 0.2 % (ref 0.0–7.0)
Eosinophils Absolute: 0 10*3/uL (ref 0.0–0.5)
MCV: 84.4 fL (ref 81.0–101.0)
MONO%: 9.1 % (ref 0.0–13.0)
NEUT#: 8.6 10*3/uL — ABNORMAL HIGH (ref 1.5–6.5)
RBC: 4.62 10*6/uL (ref 3.70–5.32)
RDW: 14.5 % (ref 11.3–14.5)

## 2007-11-16 ENCOUNTER — Encounter: Payer: Self-pay | Admitting: Infectious Diseases

## 2007-11-20 ENCOUNTER — Ambulatory Visit: Payer: Self-pay | Admitting: Internal Medicine

## 2007-11-24 ENCOUNTER — Encounter: Payer: Self-pay | Admitting: Infectious Diseases

## 2007-11-24 LAB — COMPREHENSIVE METABOLIC PANEL
ALT: 21 U/L (ref 0–35)
AST: 19 U/L (ref 0–37)
Albumin: 4.7 g/dL (ref 3.5–5.2)
Alkaline Phosphatase: 91 U/L (ref 39–117)
Glucose, Bld: 113 mg/dL — ABNORMAL HIGH (ref 70–99)
Potassium: 3.8 mEq/L (ref 3.5–5.3)
Sodium: 141 mEq/L (ref 135–145)
Total Protein: 7.4 g/dL (ref 6.0–8.3)

## 2007-11-24 LAB — CBC WITH DIFFERENTIAL/PLATELET
EOS%: 0.2 % (ref 0.0–7.0)
Eosinophils Absolute: 0 10*3/uL (ref 0.0–0.5)
MCV: 82.9 fL (ref 81.0–101.0)
MONO%: 10.3 % (ref 0.0–13.0)
NEUT#: 7.9 10*3/uL — ABNORMAL HIGH (ref 1.5–6.5)
RBC: 4.35 10*6/uL (ref 3.70–5.32)
RDW: 15 % — ABNORMAL HIGH (ref 11.3–14.5)

## 2007-12-11 ENCOUNTER — Ambulatory Visit (HOSPITAL_COMMUNITY): Admission: RE | Admit: 2007-12-11 | Discharge: 2007-12-11 | Payer: Self-pay | Admitting: Internal Medicine

## 2007-12-16 ENCOUNTER — Telehealth (INDEPENDENT_AMBULATORY_CARE_PROVIDER_SITE_OTHER): Payer: Self-pay | Admitting: *Deleted

## 2008-01-05 ENCOUNTER — Ambulatory Visit: Payer: Self-pay | Admitting: Internal Medicine

## 2008-01-05 LAB — CBC WITH DIFFERENTIAL/PLATELET
EOS%: 0 % (ref 0.0–7.0)
Eosinophils Absolute: 0 10*3/uL (ref 0.0–0.5)
MCV: 82.8 fL (ref 81.0–101.0)
MONO%: 7.9 % (ref 0.0–13.0)
NEUT#: 5.3 10*3/uL (ref 1.5–6.5)
RBC: 4.43 10*6/uL (ref 3.70–5.32)
RDW: 14.6 % — ABNORMAL HIGH (ref 11.3–14.5)
lymph#: 0.7 10*3/uL — ABNORMAL LOW (ref 0.9–3.3)

## 2008-01-05 LAB — COMPREHENSIVE METABOLIC PANEL
Albumin: 4.5 g/dL (ref 3.5–5.2)
Alkaline Phosphatase: 78 U/L (ref 39–117)
CO2: 24 mEq/L (ref 19–32)
Chloride: 105 mEq/L (ref 96–112)
Glucose, Bld: 163 mg/dL — ABNORMAL HIGH (ref 70–99)
Potassium: 4 mEq/L (ref 3.5–5.3)
Sodium: 141 mEq/L (ref 135–145)
Total Protein: 7.3 g/dL (ref 6.0–8.3)

## 2008-01-07 ENCOUNTER — Ambulatory Visit: Payer: Self-pay | Admitting: Family Medicine

## 2008-01-07 DIAGNOSIS — I152 Hypertension secondary to endocrine disorders: Secondary | ICD-10-CM | POA: Insufficient documentation

## 2008-01-07 DIAGNOSIS — A420 Pulmonary actinomycosis: Secondary | ICD-10-CM

## 2008-01-07 DIAGNOSIS — I10 Essential (primary) hypertension: Secondary | ICD-10-CM

## 2008-01-20 ENCOUNTER — Ambulatory Visit: Payer: Self-pay | Admitting: Family Medicine

## 2008-01-20 DIAGNOSIS — H1045 Other chronic allergic conjunctivitis: Secondary | ICD-10-CM

## 2008-01-30 ENCOUNTER — Emergency Department (HOSPITAL_COMMUNITY): Admission: EM | Admit: 2008-01-30 | Discharge: 2008-01-30 | Payer: Self-pay | Admitting: Emergency Medicine

## 2008-02-10 ENCOUNTER — Ambulatory Visit (HOSPITAL_COMMUNITY): Admission: RE | Admit: 2008-02-10 | Discharge: 2008-02-10 | Payer: Self-pay | Admitting: Internal Medicine

## 2008-02-12 ENCOUNTER — Ambulatory Visit: Payer: Self-pay | Admitting: Internal Medicine

## 2008-02-16 LAB — CBC WITH DIFFERENTIAL/PLATELET
Basophils Absolute: 0 10*3/uL (ref 0.0–0.1)
Eosinophils Absolute: 0 10*3/uL (ref 0.0–0.5)
HGB: 12.2 g/dL (ref 11.6–15.9)
MONO#: 0.4 10*3/uL (ref 0.1–0.9)
MONO%: 4.5 % (ref 0.0–13.0)
NEUT#: 7.2 10*3/uL — ABNORMAL HIGH (ref 1.5–6.5)
RBC: 4.11 10*6/uL (ref 3.70–5.32)
RDW: 15.5 % — ABNORMAL HIGH (ref 11.3–14.5)
WBC: 8.2 10*3/uL (ref 3.9–10.0)
lymph#: 0.7 10*3/uL — ABNORMAL LOW (ref 0.9–3.3)

## 2008-02-16 LAB — COMPREHENSIVE METABOLIC PANEL
Albumin: 4.5 g/dL (ref 3.5–5.2)
Alkaline Phosphatase: 73 U/L (ref 39–117)
BUN: 11 mg/dL (ref 6–23)
CO2: 22 mEq/L (ref 19–32)
Calcium: 9.8 mg/dL (ref 8.4–10.5)
Chloride: 104 mEq/L (ref 96–112)
Glucose, Bld: 124 mg/dL — ABNORMAL HIGH (ref 70–99)
Potassium: 4.2 mEq/L (ref 3.5–5.3)
Sodium: 139 mEq/L (ref 135–145)
Total Protein: 7 g/dL (ref 6.0–8.3)

## 2008-02-22 ENCOUNTER — Ambulatory Visit: Payer: Self-pay | Admitting: Family Medicine

## 2008-02-22 DIAGNOSIS — G4700 Insomnia, unspecified: Secondary | ICD-10-CM

## 2008-03-08 ENCOUNTER — Encounter: Payer: Self-pay | Admitting: Family Medicine

## 2008-03-08 LAB — CBC WITH DIFFERENTIAL/PLATELET
Eosinophils Absolute: 0 10*3/uL (ref 0.0–0.5)
HCT: 38.9 % (ref 34.8–46.6)
HGB: 13.3 g/dL (ref 11.6–15.9)
LYMPH%: 12 % — ABNORMAL LOW (ref 14.0–49.7)
MONO#: 0.4 10*3/uL (ref 0.1–0.9)
NEUT#: 5.9 10*3/uL (ref 1.5–6.5)
NEUT%: 82.7 % — ABNORMAL HIGH (ref 38.4–76.8)
Platelets: 387 10*3/uL (ref 145–400)
WBC: 7.2 10*3/uL (ref 3.9–10.3)
lymph#: 0.9 10*3/uL (ref 0.9–3.3)

## 2008-03-08 LAB — COMPREHENSIVE METABOLIC PANEL
AST: 18 U/L (ref 0–37)
Alkaline Phosphatase: 73 U/L (ref 39–117)
BUN: 9 mg/dL (ref 6–23)
Creatinine, Ser: 0.56 mg/dL (ref 0.40–1.20)
Glucose, Bld: 129 mg/dL — ABNORMAL HIGH (ref 70–99)
Total Bilirubin: 0.4 mg/dL (ref 0.3–1.2)

## 2008-03-21 ENCOUNTER — Ambulatory Visit: Payer: Self-pay | Admitting: Family Medicine

## 2008-03-29 ENCOUNTER — Ambulatory Visit: Payer: Self-pay | Admitting: Internal Medicine

## 2008-03-29 ENCOUNTER — Encounter: Payer: Self-pay | Admitting: Family Medicine

## 2008-03-29 LAB — CBC WITH DIFFERENTIAL/PLATELET
Basophils Absolute: 0 10*3/uL (ref 0.0–0.1)
EOS%: 0 % (ref 0.0–7.0)
Eosinophils Absolute: 0 10*3/uL (ref 0.0–0.5)
HCT: 37.5 % (ref 34.8–46.6)
HGB: 12.7 g/dL (ref 11.6–15.9)
MCH: 28.5 pg (ref 25.1–34.0)
MCV: 84.1 fL (ref 79.5–101.0)
NEUT#: 6.8 10*3/uL — ABNORMAL HIGH (ref 1.5–6.5)
NEUT%: 85.8 % — ABNORMAL HIGH (ref 38.4–76.8)
RDW: 14.7 % — ABNORMAL HIGH (ref 11.2–14.5)
lymph#: 0.7 10*3/uL — ABNORMAL LOW (ref 0.9–3.3)

## 2008-03-29 LAB — COMPREHENSIVE METABOLIC PANEL
Albumin: 4.6 g/dL (ref 3.5–5.2)
Alkaline Phosphatase: 78 U/L (ref 39–117)
BUN: 7 mg/dL (ref 6–23)
CO2: 20 mEq/L (ref 19–32)
Calcium: 10.3 mg/dL (ref 8.4–10.5)
Glucose, Bld: 139 mg/dL — ABNORMAL HIGH (ref 70–99)
Potassium: 3.9 mEq/L (ref 3.5–5.3)
Sodium: 139 mEq/L (ref 135–145)
Total Protein: 7.4 g/dL (ref 6.0–8.3)

## 2008-04-04 ENCOUNTER — Telehealth: Payer: Self-pay | Admitting: Family Medicine

## 2008-04-14 ENCOUNTER — Ambulatory Visit (HOSPITAL_COMMUNITY): Admission: RE | Admit: 2008-04-14 | Discharge: 2008-04-14 | Payer: Self-pay | Admitting: Internal Medicine

## 2008-04-19 ENCOUNTER — Encounter: Payer: Self-pay | Admitting: Infectious Diseases

## 2008-04-19 ENCOUNTER — Encounter: Payer: Self-pay | Admitting: Family Medicine

## 2008-04-19 LAB — COMPREHENSIVE METABOLIC PANEL
ALT: 21 U/L (ref 0–35)
AST: 22 U/L (ref 0–37)
Albumin: 4.1 g/dL (ref 3.5–5.2)
Alkaline Phosphatase: 66 U/L (ref 39–117)
Glucose, Bld: 161 mg/dL — ABNORMAL HIGH (ref 70–99)
Potassium: 4 mEq/L (ref 3.5–5.3)
Sodium: 139 mEq/L (ref 135–145)
Total Bilirubin: 0.9 mg/dL (ref 0.3–1.2)
Total Protein: 6.7 g/dL (ref 6.0–8.3)

## 2008-04-19 LAB — CBC WITH DIFFERENTIAL/PLATELET
BASO%: 0 % (ref 0.0–2.0)
Basophils Absolute: 0 10*3/uL (ref 0.0–0.1)
EOS%: 0 % (ref 0.0–7.0)
HGB: 13 g/dL (ref 11.6–15.9)
MCH: 29.1 pg (ref 25.1–34.0)
RBC: 4.46 10*6/uL (ref 3.70–5.45)
RDW: 15.5 % — ABNORMAL HIGH (ref 11.2–14.5)
lymph#: 0.8 10*3/uL — ABNORMAL LOW (ref 0.9–3.3)
nRBC: 0 % (ref 0–0)

## 2008-05-10 ENCOUNTER — Encounter: Payer: Self-pay | Admitting: Infectious Diseases

## 2008-05-10 ENCOUNTER — Ambulatory Visit: Payer: Self-pay | Admitting: Internal Medicine

## 2008-05-31 ENCOUNTER — Encounter: Payer: Self-pay | Admitting: Family Medicine

## 2008-05-31 LAB — COMPREHENSIVE METABOLIC PANEL
ALT: 14 U/L (ref 0–35)
CO2: 23 mEq/L (ref 19–32)
Calcium: 10 mg/dL (ref 8.4–10.5)
Chloride: 106 mEq/L (ref 96–112)
Potassium: 4.3 mEq/L (ref 3.5–5.3)
Sodium: 141 mEq/L (ref 135–145)
Total Protein: 7.3 g/dL (ref 6.0–8.3)

## 2008-05-31 LAB — CBC WITH DIFFERENTIAL/PLATELET
BASO%: 0 % (ref 0.0–2.0)
EOS%: 0 % (ref 0.0–7.0)
LYMPH%: 10.5 % — ABNORMAL LOW (ref 14.0–49.7)
MCHC: 33.7 g/dL (ref 31.5–36.0)
MONO#: 0.6 10*3/uL (ref 0.1–0.9)
RBC: 4.3 10*6/uL (ref 3.70–5.45)
WBC: 7.7 10*3/uL (ref 3.9–10.3)
lymph#: 0.8 10*3/uL — ABNORMAL LOW (ref 0.9–3.3)
nRBC: 0 % (ref 0–0)

## 2008-06-14 ENCOUNTER — Ambulatory Visit (HOSPITAL_COMMUNITY): Admission: RE | Admit: 2008-06-14 | Discharge: 2008-06-14 | Payer: Self-pay | Admitting: Internal Medicine

## 2008-06-20 ENCOUNTER — Ambulatory Visit: Payer: Self-pay | Admitting: Family Medicine

## 2008-06-20 LAB — CBC WITH DIFFERENTIAL/PLATELET
BASO%: 0 % (ref 0.0–2.0)
HCT: 39.9 % (ref 34.8–46.6)
LYMPH%: 7.4 % — ABNORMAL LOW (ref 14.0–49.7)
MCH: 30.7 pg (ref 25.1–34.0)
MCHC: 34.5 g/dL (ref 31.5–36.0)
MCV: 89.1 fL (ref 79.5–101.0)
MONO#: 0.2 10*3/uL (ref 0.1–0.9)
MONO%: 1.8 % (ref 0.0–14.0)
NEUT%: 90.7 % — ABNORMAL HIGH (ref 38.4–76.8)
Platelets: 315 10*3/uL (ref 145–400)
WBC: 10 10*3/uL (ref 3.9–10.3)

## 2008-06-20 LAB — COMPREHENSIVE METABOLIC PANEL
ALT: 22 U/L (ref 0–35)
Alkaline Phosphatase: 79 U/L (ref 39–117)
CO2: 25 mEq/L (ref 19–32)
Creatinine, Ser: 0.59 mg/dL (ref 0.40–1.20)
Total Bilirubin: 0.4 mg/dL (ref 0.3–1.2)

## 2008-07-05 ENCOUNTER — Encounter: Payer: Self-pay | Admitting: Infectious Diseases

## 2008-07-06 ENCOUNTER — Ambulatory Visit: Payer: Self-pay | Admitting: Internal Medicine

## 2008-07-12 ENCOUNTER — Encounter: Payer: Self-pay | Admitting: Family Medicine

## 2008-07-12 LAB — CBC WITH DIFFERENTIAL/PLATELET
BASO%: 0.1 % (ref 0.0–2.0)
Eosinophils Absolute: 0 10*3/uL (ref 0.0–0.5)
HCT: 38.8 % (ref 34.8–46.6)
LYMPH%: 8.7 % — ABNORMAL LOW (ref 14.0–49.7)
MCHC: 34 g/dL (ref 31.5–36.0)
MCV: 87 fL (ref 79.5–101.0)
MONO#: 0.5 10*3/uL (ref 0.1–0.9)
MONO%: 5.2 % (ref 0.0–14.0)
NEUT%: 86 % — ABNORMAL HIGH (ref 38.4–76.8)
Platelets: 422 10*3/uL — ABNORMAL HIGH (ref 145–400)
RBC: 4.46 10*6/uL (ref 3.70–5.45)
WBC: 10.4 10*3/uL — ABNORMAL HIGH (ref 3.9–10.3)

## 2008-07-12 LAB — COMPREHENSIVE METABOLIC PANEL
ALT: 12 U/L (ref 0–35)
Alkaline Phosphatase: 68 U/L (ref 39–117)
Creatinine, Ser: 0.62 mg/dL (ref 0.40–1.20)
Sodium: 139 mEq/L (ref 135–145)
Total Bilirubin: 0.6 mg/dL (ref 0.3–1.2)
Total Protein: 7.3 g/dL (ref 6.0–8.3)

## 2008-07-21 ENCOUNTER — Ambulatory Visit (HOSPITAL_COMMUNITY): Admission: RE | Admit: 2008-07-21 | Discharge: 2008-07-21 | Payer: Self-pay | Admitting: Internal Medicine

## 2008-07-28 ENCOUNTER — Ambulatory Visit: Payer: Self-pay | Admitting: Internal Medicine

## 2008-08-02 ENCOUNTER — Encounter: Payer: Self-pay | Admitting: Family Medicine

## 2008-08-02 LAB — CBC WITH DIFFERENTIAL/PLATELET
BASO%: 0.1 % (ref 0.0–2.0)
EOS%: 0 % (ref 0.0–7.0)
HCT: 39.5 % (ref 34.8–46.6)
LYMPH%: 8.3 % — ABNORMAL LOW (ref 14.0–49.7)
MCH: 29.6 pg (ref 25.1–34.0)
MCHC: 34.4 g/dL (ref 31.5–36.0)
MONO%: 5.1 % (ref 0.0–14.0)
NEUT%: 86.5 % — ABNORMAL HIGH (ref 38.4–76.8)
Platelets: 333 10*3/uL (ref 145–400)
RBC: 4.59 10*6/uL (ref 3.70–5.45)
WBC: 11.7 10*3/uL — ABNORMAL HIGH (ref 3.9–10.3)

## 2008-08-02 LAB — COMPREHENSIVE METABOLIC PANEL
ALT: 10 U/L (ref 0–35)
AST: 12 U/L (ref 0–37)
Alkaline Phosphatase: 75 U/L (ref 39–117)
Creatinine, Ser: 0.47 mg/dL (ref 0.40–1.20)
Sodium: 139 mEq/L (ref 135–145)
Total Bilirubin: 0.4 mg/dL (ref 0.3–1.2)
Total Protein: 6.7 g/dL (ref 6.0–8.3)

## 2008-08-18 ENCOUNTER — Ambulatory Visit (HOSPITAL_COMMUNITY): Admission: RE | Admit: 2008-08-18 | Discharge: 2008-08-18 | Payer: Self-pay | Admitting: Internal Medicine

## 2008-08-23 ENCOUNTER — Ambulatory Visit: Payer: Self-pay | Admitting: Internal Medicine

## 2008-08-23 ENCOUNTER — Encounter: Payer: Self-pay | Admitting: Family Medicine

## 2008-08-23 LAB — CBC WITH DIFFERENTIAL/PLATELET
BASO%: 0.1 % (ref 0.0–2.0)
EOS%: 0 % (ref 0.0–7.0)
HCT: 37.5 % (ref 34.8–46.6)
MCHC: 33.9 g/dL (ref 31.5–36.0)
MONO#: 0.3 10*3/uL (ref 0.1–0.9)
NEUT%: 87.8 % — ABNORMAL HIGH (ref 38.4–76.8)
RBC: 4.31 10*6/uL (ref 3.70–5.45)
RDW: 14.4 % (ref 11.2–14.5)
WBC: 9.6 10*3/uL (ref 3.9–10.3)
lymph#: 0.9 10*3/uL (ref 0.9–3.3)

## 2008-08-23 LAB — COMPREHENSIVE METABOLIC PANEL
ALT: 13 U/L (ref 0–35)
AST: 13 U/L (ref 0–37)
Albumin: 4.5 g/dL (ref 3.5–5.2)
CO2: 22 mEq/L (ref 19–32)
Calcium: 10.2 mg/dL (ref 8.4–10.5)
Chloride: 104 mEq/L (ref 96–112)
Potassium: 4.4 mEq/L (ref 3.5–5.3)
Sodium: 139 mEq/L (ref 135–145)
Total Protein: 7.1 g/dL (ref 6.0–8.3)

## 2008-09-13 ENCOUNTER — Encounter: Payer: Self-pay | Admitting: Family Medicine

## 2008-09-13 LAB — COMPREHENSIVE METABOLIC PANEL
Albumin: 4 g/dL (ref 3.5–5.2)
CO2: 22 mEq/L (ref 19–32)
Calcium: 9.5 mg/dL (ref 8.4–10.5)
Glucose, Bld: 151 mg/dL — ABNORMAL HIGH (ref 70–99)
Potassium: 4.1 mEq/L (ref 3.5–5.3)
Sodium: 141 mEq/L (ref 135–145)
Total Bilirubin: 0.3 mg/dL (ref 0.3–1.2)
Total Protein: 6.5 g/dL (ref 6.0–8.3)

## 2008-09-13 LAB — CBC WITH DIFFERENTIAL/PLATELET
Eosinophils Absolute: 0 10*3/uL (ref 0.0–0.5)
MONO#: 0.3 10*3/uL (ref 0.1–0.9)
NEUT#: 8.5 10*3/uL — ABNORMAL HIGH (ref 1.5–6.5)
RBC: 4.06 10*6/uL (ref 3.70–5.45)
RDW: 14.3 % (ref 11.2–14.5)
WBC: 9.8 10*3/uL (ref 3.9–10.3)
lymph#: 0.9 10*3/uL (ref 0.9–3.3)

## 2008-09-20 ENCOUNTER — Ambulatory Visit: Payer: Self-pay | Admitting: Family Medicine

## 2008-09-21 LAB — CONVERTED CEMR LAB
AST: 22 units/L (ref 0–37)
Albumin: 3.9 g/dL (ref 3.5–5.2)
Alkaline Phosphatase: 60 units/L (ref 39–117)
Bilirubin, Direct: 0 mg/dL (ref 0.0–0.3)
CO2: 31 meq/L (ref 19–32)
Glucose, Bld: 108 mg/dL — ABNORMAL HIGH (ref 70–99)
Potassium: 4.1 meq/L (ref 3.5–5.1)
Sodium: 143 meq/L (ref 135–145)
Total Protein: 6.9 g/dL (ref 6.0–8.3)

## 2008-09-29 ENCOUNTER — Ambulatory Visit: Payer: Self-pay | Admitting: Internal Medicine

## 2008-09-29 ENCOUNTER — Ambulatory Visit: Payer: Self-pay | Admitting: Family Medicine

## 2008-09-29 DIAGNOSIS — E119 Type 2 diabetes mellitus without complications: Secondary | ICD-10-CM | POA: Insufficient documentation

## 2008-10-04 ENCOUNTER — Encounter: Payer: Self-pay | Admitting: Family Medicine

## 2008-10-04 LAB — CBC WITH DIFFERENTIAL/PLATELET
BASO%: 0.1 % (ref 0.0–2.0)
Eosinophils Absolute: 0 10*3/uL (ref 0.0–0.5)
MCHC: 33.7 g/dL (ref 31.5–36.0)
MCV: 87.7 fL (ref 79.5–101.0)
MONO%: 9.1 % (ref 0.0–14.0)
NEUT#: 8.5 10*3/uL — ABNORMAL HIGH (ref 1.5–6.5)
RBC: 4.16 10*6/uL (ref 3.70–5.45)
RDW: 14.6 % — ABNORMAL HIGH (ref 11.2–14.5)
WBC: 10.4 10*3/uL — ABNORMAL HIGH (ref 3.9–10.3)
nRBC: 0 % (ref 0–0)

## 2008-10-04 LAB — COMPREHENSIVE METABOLIC PANEL
Albumin: 4.3 g/dL (ref 3.5–5.2)
CO2: 22 mEq/L (ref 19–32)
Glucose, Bld: 113 mg/dL — ABNORMAL HIGH (ref 70–99)
Potassium: 4.2 mEq/L (ref 3.5–5.3)
Sodium: 143 mEq/L (ref 135–145)
Total Protein: 6.7 g/dL (ref 6.0–8.3)

## 2008-10-20 ENCOUNTER — Ambulatory Visit: Payer: Self-pay | Admitting: Family Medicine

## 2008-10-25 ENCOUNTER — Encounter: Payer: Self-pay | Admitting: Family Medicine

## 2008-10-25 LAB — CBC WITH DIFFERENTIAL/PLATELET
BASO%: 0.2 % (ref 0.0–2.0)
LYMPH%: 6.7 % — ABNORMAL LOW (ref 14.0–49.7)
MCHC: 33.5 g/dL (ref 31.5–36.0)
MCV: 87.7 fL (ref 79.5–101.0)
MONO%: 5.8 % (ref 0.0–14.0)
Platelets: 481 10*3/uL — ABNORMAL HIGH (ref 145–400)
RBC: 4.56 10*6/uL (ref 3.70–5.45)
RDW: 14.3 % (ref 11.2–14.5)
WBC: 14.6 10*3/uL — ABNORMAL HIGH (ref 3.9–10.3)
nRBC: 0 % (ref 0–0)

## 2008-10-25 LAB — COMPREHENSIVE METABOLIC PANEL
ALT: 11 U/L (ref 0–35)
AST: 14 U/L (ref 0–37)
Alkaline Phosphatase: 62 U/L (ref 39–117)
Creatinine, Ser: 0.5 mg/dL (ref 0.40–1.20)
Total Bilirubin: 0.3 mg/dL (ref 0.3–1.2)

## 2008-10-28 ENCOUNTER — Ambulatory Visit: Payer: Self-pay | Admitting: Internal Medicine

## 2008-11-01 LAB — COMPREHENSIVE METABOLIC PANEL
ALT: 9 U/L (ref 0–35)
CO2: 21 mEq/L (ref 19–32)
Creatinine, Ser: 0.63 mg/dL (ref 0.40–1.20)
Total Bilirubin: 0.3 mg/dL (ref 0.3–1.2)

## 2008-11-01 LAB — CBC WITH DIFFERENTIAL/PLATELET
BASO%: 0.1 % (ref 0.0–2.0)
HCT: 38.6 % (ref 34.8–46.6)
LYMPH%: 6.1 % — ABNORMAL LOW (ref 14.0–49.7)
MCH: 30.4 pg (ref 25.1–34.0)
MCHC: 33.8 g/dL (ref 31.5–36.0)
MCV: 89.9 fL (ref 79.5–101.0)
MONO#: 0.4 10*3/uL (ref 0.1–0.9)
NEUT%: 90.7 % — ABNORMAL HIGH (ref 38.4–76.8)
Platelets: 452 10*3/uL — ABNORMAL HIGH (ref 145–400)

## 2008-11-18 ENCOUNTER — Ambulatory Visit (HOSPITAL_COMMUNITY): Admission: RE | Admit: 2008-11-18 | Discharge: 2008-11-18 | Payer: Self-pay | Admitting: Internal Medicine

## 2008-11-22 ENCOUNTER — Ambulatory Visit: Payer: Self-pay | Admitting: Internal Medicine

## 2008-11-22 ENCOUNTER — Encounter: Payer: Self-pay | Admitting: Family Medicine

## 2008-11-22 LAB — COMPREHENSIVE METABOLIC PANEL
ALT: 14 U/L (ref 0–35)
AST: 14 U/L (ref 0–37)
Albumin: 4.4 g/dL (ref 3.5–5.2)
Alkaline Phosphatase: 66 U/L (ref 39–117)
BUN: 13 mg/dL (ref 6–23)
CO2: 20 mEq/L (ref 19–32)
Calcium: 10.1 mg/dL (ref 8.4–10.5)
Chloride: 106 mEq/L (ref 96–112)
Creatinine, Ser: 0.53 mg/dL (ref 0.40–1.20)
Glucose, Bld: 227 mg/dL — ABNORMAL HIGH (ref 70–99)
Potassium: 4.1 mEq/L (ref 3.5–5.3)
Sodium: 143 mEq/L (ref 135–145)
Total Bilirubin: 0.3 mg/dL (ref 0.3–1.2)
Total Protein: 7.1 g/dL (ref 6.0–8.3)

## 2008-11-22 LAB — CBC WITH DIFFERENTIAL/PLATELET
BASO%: 0.2 % (ref 0.0–2.0)
Eosinophils Absolute: 0 10*3/uL (ref 0.0–0.5)
LYMPH%: 8.8 % — ABNORMAL LOW (ref 14.0–49.7)
MCHC: 33.7 g/dL (ref 31.5–36.0)
MONO#: 0 10*3/uL — ABNORMAL LOW (ref 0.1–0.9)
NEUT#: 7 10*3/uL — ABNORMAL HIGH (ref 1.5–6.5)
Platelets: 576 10*3/uL — ABNORMAL HIGH (ref 145–400)
RBC: 4.32 10*6/uL (ref 3.70–5.45)
RDW: 14.7 % — ABNORMAL HIGH (ref 11.2–14.5)
WBC: 7.8 10*3/uL (ref 3.9–10.3)

## 2008-12-13 ENCOUNTER — Encounter: Payer: Self-pay | Admitting: Family Medicine

## 2008-12-13 LAB — COMPREHENSIVE METABOLIC PANEL
Albumin: 4 g/dL (ref 3.5–5.2)
BUN: 9 mg/dL (ref 6–23)
Calcium: 9.4 mg/dL (ref 8.4–10.5)
Chloride: 108 mEq/L (ref 96–112)
Glucose, Bld: 148 mg/dL — ABNORMAL HIGH (ref 70–99)
Potassium: 3.9 mEq/L (ref 3.5–5.3)
Sodium: 141 mEq/L (ref 135–145)
Total Protein: 6.7 g/dL (ref 6.0–8.3)

## 2008-12-13 LAB — CBC WITH DIFFERENTIAL/PLATELET
Basophils Absolute: 0 10*3/uL (ref 0.0–0.1)
EOS%: 0 % (ref 0.0–7.0)
Eosinophils Absolute: 0 10*3/uL (ref 0.0–0.5)
HGB: 11.8 g/dL (ref 11.6–15.9)
MCH: 28.7 pg (ref 25.1–34.0)
MCV: 88.1 fL (ref 79.5–101.0)
MONO%: 15.9 % — ABNORMAL HIGH (ref 0.0–14.0)
NEUT#: 6.1 10*3/uL (ref 1.5–6.5)
RBC: 4.11 10*6/uL (ref 3.70–5.45)
RDW: 14.5 % (ref 11.2–14.5)
lymph#: 1.1 10*3/uL (ref 0.9–3.3)
nRBC: 0 % (ref 0–0)

## 2008-12-26 ENCOUNTER — Ambulatory Visit: Payer: Self-pay | Admitting: Family Medicine

## 2008-12-28 ENCOUNTER — Ambulatory Visit: Payer: Self-pay | Admitting: Internal Medicine

## 2009-01-03 ENCOUNTER — Encounter: Payer: Self-pay | Admitting: Family Medicine

## 2009-01-03 LAB — COMPREHENSIVE METABOLIC PANEL
ALT: 9 U/L (ref 0–35)
Albumin: 4.1 g/dL (ref 3.5–5.2)
CO2: 21 mEq/L (ref 19–32)
Calcium: 9.5 mg/dL (ref 8.4–10.5)
Chloride: 105 mEq/L (ref 96–112)
Glucose, Bld: 163 mg/dL — ABNORMAL HIGH (ref 70–99)
Potassium: 4 mEq/L (ref 3.5–5.3)
Sodium: 140 mEq/L (ref 135–145)
Total Bilirubin: 0.3 mg/dL (ref 0.3–1.2)
Total Protein: 6.7 g/dL (ref 6.0–8.3)

## 2009-01-03 LAB — CBC WITH DIFFERENTIAL/PLATELET
BASO%: 0 % (ref 0.0–2.0)
Eosinophils Absolute: 0 10*3/uL (ref 0.0–0.5)
LYMPH%: 11.4 % — ABNORMAL LOW (ref 14.0–49.7)
MCHC: 33.3 g/dL (ref 31.5–36.0)
MONO#: 0.3 10*3/uL (ref 0.1–0.9)
NEUT#: 4.2 10*3/uL (ref 1.5–6.5)
Platelets: 378 10*3/uL (ref 145–400)
RBC: 4.3 10*6/uL (ref 3.70–5.45)
WBC: 5.1 10*3/uL (ref 3.9–10.3)
lymph#: 0.6 10*3/uL — ABNORMAL LOW (ref 0.9–3.3)

## 2009-01-05 ENCOUNTER — Ambulatory Visit: Payer: Self-pay | Admitting: Family Medicine

## 2009-01-05 LAB — CONVERTED CEMR LAB
Cholesterol, target level: 200 mg/dL
HDL goal, serum: 40 mg/dL

## 2009-01-20 ENCOUNTER — Ambulatory Visit (HOSPITAL_COMMUNITY): Admission: RE | Admit: 2009-01-20 | Discharge: 2009-01-20 | Payer: Self-pay | Admitting: Internal Medicine

## 2009-01-24 ENCOUNTER — Encounter: Payer: Self-pay | Admitting: Family Medicine

## 2009-01-24 LAB — COMPREHENSIVE METABOLIC PANEL
AST: 13 U/L (ref 0–37)
Albumin: 4.4 g/dL (ref 3.5–5.2)
Alkaline Phosphatase: 72 U/L (ref 39–117)
Potassium: 3.8 mEq/L (ref 3.5–5.3)
Sodium: 141 mEq/L (ref 135–145)
Total Bilirubin: 0.4 mg/dL (ref 0.3–1.2)
Total Protein: 7.3 g/dL (ref 6.0–8.3)

## 2009-01-24 LAB — CBC WITH DIFFERENTIAL/PLATELET
BASO%: 0.1 % (ref 0.0–2.0)
EOS%: 0 % (ref 0.0–7.0)
HGB: 11.8 g/dL (ref 11.6–15.9)
MCH: 28.1 pg (ref 25.1–34.0)
MCHC: 33.6 g/dL (ref 31.5–36.0)
MCV: 83.6 fL (ref 79.5–101.0)
MONO%: 2.7 % (ref 0.0–14.0)
RBC: 4.2 10*6/uL (ref 3.70–5.45)
RDW: 14.9 % — ABNORMAL HIGH (ref 11.2–14.5)
lymph#: 0.6 10*3/uL — ABNORMAL LOW (ref 0.9–3.3)
nRBC: 0 % (ref 0–0)

## 2009-01-27 ENCOUNTER — Ambulatory Visit: Payer: Self-pay | Admitting: Internal Medicine

## 2009-01-31 LAB — COMPREHENSIVE METABOLIC PANEL
AST: 12 U/L (ref 0–37)
Albumin: 4 g/dL (ref 3.5–5.2)
Alkaline Phosphatase: 53 U/L (ref 39–117)
BUN: 15 mg/dL (ref 6–23)
Calcium: 10 mg/dL (ref 8.4–10.5)
Creatinine, Ser: 0.67 mg/dL (ref 0.40–1.20)
Glucose, Bld: 204 mg/dL — ABNORMAL HIGH (ref 70–99)
Potassium: 4.1 mEq/L (ref 3.5–5.3)

## 2009-01-31 LAB — CBC WITH DIFFERENTIAL/PLATELET
Basophils Absolute: 0 10*3/uL (ref 0.0–0.1)
Eosinophils Absolute: 0 10*3/uL (ref 0.0–0.5)
HCT: 34.4 % — ABNORMAL LOW (ref 34.8–46.6)
HGB: 11.7 g/dL (ref 11.6–15.9)
MCH: 29.5 pg (ref 25.1–34.0)
MCV: 87.1 fL (ref 79.5–101.0)
NEUT#: 17.9 10*3/uL — ABNORMAL HIGH (ref 1.5–6.5)
NEUT%: 89.3 % — ABNORMAL HIGH (ref 38.4–76.8)
RDW: 16.5 % — ABNORMAL HIGH (ref 11.2–14.5)
lymph#: 1.4 10*3/uL (ref 0.9–3.3)

## 2009-02-20 ENCOUNTER — Encounter: Payer: Self-pay | Admitting: Family Medicine

## 2009-02-21 ENCOUNTER — Encounter: Payer: Self-pay | Admitting: Family Medicine

## 2009-02-21 LAB — CBC WITH DIFFERENTIAL/PLATELET
EOS%: 0 % (ref 0.0–7.0)
MCH: 27.8 pg (ref 25.1–34.0)
MCV: 85.9 fL (ref 79.5–101.0)
MONO%: 2.9 % (ref 0.0–14.0)
RBC: 4.32 10*6/uL (ref 3.70–5.45)
RDW: 15.2 % — ABNORMAL HIGH (ref 11.2–14.5)

## 2009-02-21 LAB — COMPREHENSIVE METABOLIC PANEL
AST: 13 U/L (ref 0–37)
Albumin: 4.3 g/dL (ref 3.5–5.2)
Alkaline Phosphatase: 80 U/L (ref 39–117)
BUN: 13 mg/dL (ref 6–23)
Potassium: 4.2 mEq/L (ref 3.5–5.3)
Sodium: 140 mEq/L (ref 135–145)
Total Protein: 7.1 g/dL (ref 6.0–8.3)

## 2009-02-28 ENCOUNTER — Ambulatory Visit: Payer: Self-pay | Admitting: Internal Medicine

## 2009-02-28 LAB — BASIC METABOLIC PANEL
BUN: 13 mg/dL (ref 6–23)
Chloride: 102 mEq/L (ref 96–112)
Creatinine, Ser: 0.56 mg/dL (ref 0.40–1.20)
Glucose, Bld: 113 mg/dL — ABNORMAL HIGH (ref 70–99)

## 2009-03-14 ENCOUNTER — Encounter: Payer: Self-pay | Admitting: Family Medicine

## 2009-03-14 LAB — COMPREHENSIVE METABOLIC PANEL
ALT: 10 U/L (ref 0–35)
Albumin: 4.2 g/dL (ref 3.5–5.2)
CO2: 19 mEq/L (ref 19–32)
Calcium: 10 mg/dL (ref 8.4–10.5)
Chloride: 107 mEq/L (ref 96–112)
Glucose, Bld: 157 mg/dL — ABNORMAL HIGH (ref 70–99)
Potassium: 4.2 mEq/L (ref 3.5–5.3)
Sodium: 142 mEq/L (ref 135–145)
Total Protein: 6.8 g/dL (ref 6.0–8.3)

## 2009-03-14 LAB — CBC WITH DIFFERENTIAL/PLATELET
BASO%: 0 % (ref 0.0–2.0)
LYMPH%: 8.1 % — ABNORMAL LOW (ref 14.0–49.7)
MCHC: 32.7 g/dL (ref 31.5–36.0)
MONO#: 0.7 10*3/uL (ref 0.1–0.9)
Platelets: 374 10*3/uL (ref 145–400)
RBC: 4.31 10*6/uL (ref 3.70–5.45)
RDW: 15.2 % — ABNORMAL HIGH (ref 11.2–14.5)
WBC: 9.8 10*3/uL (ref 3.9–10.3)
lymph#: 0.8 10*3/uL — ABNORMAL LOW (ref 0.9–3.3)
nRBC: 0 % (ref 0–0)

## 2009-03-29 ENCOUNTER — Ambulatory Visit: Payer: Self-pay | Admitting: Family Medicine

## 2009-03-30 ENCOUNTER — Ambulatory Visit (HOSPITAL_COMMUNITY): Admission: RE | Admit: 2009-03-30 | Discharge: 2009-03-30 | Payer: Self-pay | Admitting: Internal Medicine

## 2009-03-30 LAB — CONVERTED CEMR LAB: Hgb A1c MFr Bld: 6.3 % (ref 4.6–6.5)

## 2009-03-31 ENCOUNTER — Ambulatory Visit: Payer: Self-pay | Admitting: Internal Medicine

## 2009-04-04 ENCOUNTER — Encounter: Payer: Self-pay | Admitting: Family Medicine

## 2009-04-04 LAB — CBC WITH DIFFERENTIAL/PLATELET
BASO%: 0.1 % (ref 0.0–2.0)
Basophils Absolute: 0 10*3/uL (ref 0.0–0.1)
EOS%: 0 % (ref 0.0–7.0)
HGB: 12.6 g/dL (ref 11.6–15.9)
MCH: 28 pg (ref 25.1–34.0)
MCHC: 32.7 g/dL (ref 31.5–36.0)
RDW: 15.9 % — ABNORMAL HIGH (ref 11.2–14.5)
WBC: 10.5 10*3/uL — ABNORMAL HIGH (ref 3.9–10.3)
lymph#: 0.9 10*3/uL (ref 0.9–3.3)

## 2009-04-04 LAB — COMPREHENSIVE METABOLIC PANEL
AST: 13 U/L (ref 0–37)
Albumin: 4.3 g/dL (ref 3.5–5.2)
Alkaline Phosphatase: 60 U/L (ref 39–117)
Potassium: 4.1 mEq/L (ref 3.5–5.3)
Sodium: 141 mEq/L (ref 135–145)
Total Protein: 6.8 g/dL (ref 6.0–8.3)

## 2009-04-07 ENCOUNTER — Ambulatory Visit: Payer: Self-pay | Admitting: Family Medicine

## 2009-04-07 DIAGNOSIS — K5900 Constipation, unspecified: Secondary | ICD-10-CM | POA: Insufficient documentation

## 2009-04-07 LAB — CONVERTED CEMR LAB
AST: 20 units/L (ref 0–37)
Albumin: 3.4 g/dL — ABNORMAL LOW (ref 3.5–5.2)
BUN: 7 mg/dL (ref 6–23)
CO2: 27 meq/L (ref 19–32)
Calcium: 9 mg/dL (ref 8.4–10.5)
Cholesterol: 150 mg/dL (ref 0–200)
GFR calc non Af Amer: 91.86 mL/min (ref >60)
Glucose, Bld: 96 mg/dL (ref 70–99)
Hgb A1c MFr Bld: 6.2 % (ref 4.6–6.5)
Potassium: 4.4 meq/L (ref 3.5–5.1)
Total Protein: 6.7 g/dL (ref 6.0–8.3)
VLDL: 22.6 mg/dL (ref 0.0–40.0)

## 2009-04-25 ENCOUNTER — Encounter: Payer: Self-pay | Admitting: Family Medicine

## 2009-04-25 LAB — COMPREHENSIVE METABOLIC PANEL
BUN: 15 mg/dL (ref 6–23)
CO2: 22 mEq/L (ref 19–32)
Calcium: 9.9 mg/dL (ref 8.4–10.5)
Chloride: 105 mEq/L (ref 96–112)
Creatinine, Ser: 0.58 mg/dL (ref 0.40–1.20)
Total Bilirubin: 0.3 mg/dL (ref 0.3–1.2)

## 2009-04-25 LAB — CBC WITH DIFFERENTIAL/PLATELET
Basophils Absolute: 0 10*3/uL (ref 0.0–0.1)
HCT: 36.3 % (ref 34.8–46.6)
HGB: 12.4 g/dL (ref 11.6–15.9)
LYMPH%: 6.4 % — ABNORMAL LOW (ref 14.0–49.7)
MCH: 29.4 pg (ref 25.1–34.0)
MCHC: 34.1 g/dL (ref 31.5–36.0)
MONO#: 0.3 10*3/uL (ref 0.1–0.9)
NEUT%: 90.2 % — ABNORMAL HIGH (ref 38.4–76.8)
Platelets: 522 10*3/uL — ABNORMAL HIGH (ref 145–400)
WBC: 10.1 10*3/uL (ref 3.9–10.3)
lymph#: 0.7 10*3/uL — ABNORMAL LOW (ref 0.9–3.3)

## 2009-05-15 ENCOUNTER — Ambulatory Visit: Payer: Self-pay | Admitting: Internal Medicine

## 2009-05-16 LAB — CBC WITH DIFFERENTIAL/PLATELET
BASO%: 0 % (ref 0.0–2.0)
EOS%: 0 % (ref 0.0–7.0)
MCH: 28.2 pg (ref 25.1–34.0)
MCV: 86.3 fL (ref 79.5–101.0)
MONO%: 5 % (ref 0.0–14.0)
NEUT#: 5.5 10*3/uL (ref 1.5–6.5)
RBC: 3.93 10*6/uL (ref 3.70–5.45)
RDW: 15.4 % — ABNORMAL HIGH (ref 11.2–14.5)

## 2009-05-16 LAB — COMPREHENSIVE METABOLIC PANEL
ALT: 13 U/L (ref 0–35)
AST: 13 U/L (ref 0–37)
Albumin: 4 g/dL (ref 3.5–5.2)
Alkaline Phosphatase: 72 U/L (ref 39–117)
Potassium: 3.7 mEq/L (ref 3.5–5.3)
Sodium: 142 mEq/L (ref 135–145)
Total Protein: 6.5 g/dL (ref 6.0–8.3)

## 2009-05-30 ENCOUNTER — Ambulatory Visit (HOSPITAL_COMMUNITY): Admission: RE | Admit: 2009-05-30 | Discharge: 2009-05-30 | Payer: Self-pay | Admitting: Internal Medicine

## 2009-06-01 ENCOUNTER — Encounter: Payer: Self-pay | Admitting: Emergency Medicine

## 2009-06-06 ENCOUNTER — Encounter: Payer: Self-pay | Admitting: Family Medicine

## 2009-06-06 LAB — CBC WITH DIFFERENTIAL/PLATELET
Basophils Absolute: 0 10*3/uL (ref 0.0–0.1)
EOS%: 0 % (ref 0.0–7.0)
HGB: 12.9 g/dL (ref 11.6–15.9)
MCH: 29.8 pg (ref 25.1–34.0)
MCV: 87.9 fL (ref 79.5–101.0)
MONO%: 5 % (ref 0.0–14.0)
RBC: 4.35 10*6/uL (ref 3.70–5.45)
RDW: 16.4 % — ABNORMAL HIGH (ref 11.2–14.5)

## 2009-06-06 LAB — COMPREHENSIVE METABOLIC PANEL
AST: 15 U/L (ref 0–37)
Albumin: 4.4 g/dL (ref 3.5–5.2)
Alkaline Phosphatase: 68 U/L (ref 39–117)
BUN: 11 mg/dL (ref 6–23)
Potassium: 3.5 mEq/L (ref 3.5–5.3)

## 2009-06-12 ENCOUNTER — Telehealth: Payer: Self-pay | Admitting: Family Medicine

## 2009-06-13 ENCOUNTER — Ambulatory Visit: Payer: Self-pay | Admitting: Family Medicine

## 2009-06-13 DIAGNOSIS — H6980 Other specified disorders of Eustachian tube, unspecified ear: Secondary | ICD-10-CM

## 2009-06-13 DIAGNOSIS — H8110 Benign paroxysmal vertigo, unspecified ear: Secondary | ICD-10-CM

## 2009-06-13 DIAGNOSIS — H6990 Unspecified Eustachian tube disorder, unspecified ear: Secondary | ICD-10-CM | POA: Insufficient documentation

## 2009-06-15 ENCOUNTER — Telehealth: Payer: Self-pay | Admitting: Family Medicine

## 2009-06-21 ENCOUNTER — Encounter: Payer: Self-pay | Admitting: Family Medicine

## 2009-06-23 ENCOUNTER — Ambulatory Visit: Payer: Self-pay | Admitting: Internal Medicine

## 2009-06-26 ENCOUNTER — Ambulatory Visit: Payer: Self-pay | Admitting: Family Medicine

## 2009-06-27 ENCOUNTER — Encounter: Payer: Self-pay | Admitting: Family Medicine

## 2009-06-27 LAB — CBC WITH DIFFERENTIAL/PLATELET
Basophils Absolute: 0 10*3/uL (ref 0.0–0.1)
EOS%: 0 % (ref 0.0–7.0)
HCT: 36.9 % (ref 34.8–46.6)
HGB: 12.4 g/dL (ref 11.6–15.9)
MCH: 29.2 pg (ref 25.1–34.0)
MONO#: 0.5 10*3/uL (ref 0.1–0.9)
NEUT#: 7.9 10*3/uL — ABNORMAL HIGH (ref 1.5–6.5)
RDW: 15 % — ABNORMAL HIGH (ref 11.2–14.5)
WBC: 9.3 10*3/uL (ref 3.9–10.3)
lymph#: 0.9 10*3/uL (ref 0.9–3.3)

## 2009-06-27 LAB — COMPREHENSIVE METABOLIC PANEL
AST: 16 U/L (ref 0–37)
Albumin: 4.2 g/dL (ref 3.5–5.2)
Alkaline Phosphatase: 66 U/L (ref 39–117)
Calcium: 9.6 mg/dL (ref 8.4–10.5)
Chloride: 104 mEq/L (ref 96–112)
Glucose, Bld: 180 mg/dL — ABNORMAL HIGH (ref 70–99)
Potassium: 4.2 mEq/L (ref 3.5–5.3)
Sodium: 136 mEq/L (ref 135–145)
Total Protein: 6.4 g/dL (ref 6.0–8.3)

## 2009-07-02 LAB — CONVERTED CEMR LAB: Hgb A1c MFr Bld: 6.2 % (ref 4.6–6.5)

## 2009-07-07 ENCOUNTER — Ambulatory Visit: Payer: Self-pay | Admitting: Family Medicine

## 2009-07-07 LAB — HM DIABETES EYE EXAM: HM Diabetic Eye Exam: NORMAL

## 2009-07-18 ENCOUNTER — Encounter: Payer: Self-pay | Admitting: Family Medicine

## 2009-07-18 LAB — COMPREHENSIVE METABOLIC PANEL
ALT: 16 U/L (ref 0–35)
Albumin: 4.1 g/dL (ref 3.5–5.2)
CO2: 20 mEq/L (ref 19–32)
Calcium: 9.8 mg/dL (ref 8.4–10.5)
Chloride: 106 mEq/L (ref 96–112)
Glucose, Bld: 224 mg/dL — ABNORMAL HIGH (ref 70–99)
Sodium: 140 mEq/L (ref 135–145)
Total Bilirubin: 0.5 mg/dL (ref 0.3–1.2)
Total Protein: 6.8 g/dL (ref 6.0–8.3)

## 2009-07-18 LAB — CBC WITH DIFFERENTIAL/PLATELET
BASO%: 0.1 % (ref 0.0–2.0)
Eosinophils Absolute: 0 10*3/uL (ref 0.0–0.5)
LYMPH%: 11.4 % — ABNORMAL LOW (ref 14.0–49.7)
MCHC: 33.4 g/dL (ref 31.5–36.0)
MONO#: 0.6 10*3/uL (ref 0.1–0.9)
NEUT#: 6.1 10*3/uL (ref 1.5–6.5)
RBC: 4.37 10*6/uL (ref 3.70–5.45)
RDW: 14.1 % (ref 11.2–14.5)
WBC: 7.5 10*3/uL (ref 3.9–10.3)
lymph#: 0.9 10*3/uL (ref 0.9–3.3)
nRBC: 0 % (ref 0–0)

## 2009-08-04 ENCOUNTER — Ambulatory Visit (HOSPITAL_COMMUNITY): Admission: RE | Admit: 2009-08-04 | Discharge: 2009-08-04 | Payer: Self-pay | Admitting: Internal Medicine

## 2009-08-04 ENCOUNTER — Ambulatory Visit: Payer: Self-pay | Admitting: Internal Medicine

## 2009-08-08 ENCOUNTER — Encounter: Payer: Self-pay | Admitting: Family Medicine

## 2009-08-29 ENCOUNTER — Encounter: Payer: Self-pay | Admitting: Family Medicine

## 2009-08-29 LAB — COMPREHENSIVE METABOLIC PANEL
ALT: 26 U/L (ref 0–35)
BUN: 16 mg/dL (ref 6–23)
CO2: 18 mEq/L — ABNORMAL LOW (ref 19–32)
Calcium: 9.5 mg/dL (ref 8.4–10.5)
Chloride: 105 mEq/L (ref 96–112)
Creatinine, Ser: 0.57 mg/dL (ref 0.40–1.20)
Total Bilirubin: 0.3 mg/dL (ref 0.3–1.2)

## 2009-08-29 LAB — CBC WITH DIFFERENTIAL/PLATELET
BASO%: 0.1 % (ref 0.0–2.0)
Basophils Absolute: 0 10*3/uL (ref 0.0–0.1)
EOS%: 0 % (ref 0.0–7.0)
HCT: 40.1 % (ref 34.8–46.6)
HGB: 13.4 g/dL (ref 11.6–15.9)
LYMPH%: 7 % — ABNORMAL LOW (ref 14.0–49.7)
MCH: 29.3 pg (ref 25.1–34.0)
MCHC: 33.4 g/dL (ref 31.5–36.0)
NEUT%: 89.4 % — ABNORMAL HIGH (ref 38.4–76.8)
Platelets: 271 10*3/uL (ref 145–400)

## 2009-09-14 ENCOUNTER — Ambulatory Visit: Payer: Self-pay | Admitting: Internal Medicine

## 2009-09-19 ENCOUNTER — Encounter: Payer: Self-pay | Admitting: Family Medicine

## 2009-09-19 LAB — CBC WITH DIFFERENTIAL/PLATELET
BASO%: 0.1 % (ref 0.0–2.0)
EOS%: 0 % (ref 0.0–7.0)
HCT: 35.7 % (ref 34.8–46.6)
MCH: 29.3 pg (ref 25.1–34.0)
MCHC: 33.1 g/dL (ref 31.5–36.0)
NEUT%: 80.4 % — ABNORMAL HIGH (ref 38.4–76.8)
RDW: 15.1 % — ABNORMAL HIGH (ref 11.2–14.5)
lymph#: 0.8 10*3/uL — ABNORMAL LOW (ref 0.9–3.3)

## 2009-09-19 LAB — COMPREHENSIVE METABOLIC PANEL
ALT: 12 U/L (ref 0–35)
AST: 16 U/L (ref 0–37)
Alkaline Phosphatase: 61 U/L (ref 39–117)
BUN: 11 mg/dL (ref 6–23)
Creatinine, Ser: 0.52 mg/dL (ref 0.40–1.20)
Total Bilirubin: 0.3 mg/dL (ref 0.3–1.2)

## 2009-10-03 ENCOUNTER — Ambulatory Visit (HOSPITAL_COMMUNITY): Admission: RE | Admit: 2009-10-03 | Discharge: 2009-10-03 | Payer: Self-pay | Admitting: Internal Medicine

## 2009-10-10 ENCOUNTER — Encounter: Payer: Self-pay | Admitting: Family Medicine

## 2009-10-10 LAB — CBC WITH DIFFERENTIAL/PLATELET
Eosinophils Absolute: 0 10*3/uL (ref 0.0–0.5)
HGB: 11.5 g/dL — ABNORMAL LOW (ref 11.6–15.9)
MONO#: 1.3 10*3/uL — ABNORMAL HIGH (ref 0.1–0.9)
MONO%: 11.4 % (ref 0.0–14.0)
NEUT#: 8.9 10*3/uL — ABNORMAL HIGH (ref 1.5–6.5)
RBC: 4.06 10*6/uL (ref 3.70–5.45)
RDW: 15 % — ABNORMAL HIGH (ref 11.2–14.5)
WBC: 11.2 10*3/uL — ABNORMAL HIGH (ref 3.9–10.3)
lymph#: 1.1 10*3/uL (ref 0.9–3.3)
nRBC: 0 % (ref 0–0)

## 2009-10-10 LAB — COMPREHENSIVE METABOLIC PANEL
Albumin: 3.8 g/dL (ref 3.5–5.2)
BUN: 12 mg/dL (ref 6–23)
CO2: 22 mEq/L (ref 19–32)
Calcium: 9.3 mg/dL (ref 8.4–10.5)
Glucose, Bld: 156 mg/dL — ABNORMAL HIGH (ref 70–99)
Potassium: 4.8 mEq/L (ref 3.5–5.3)
Sodium: 140 mEq/L (ref 135–145)
Total Protein: 6.3 g/dL (ref 6.0–8.3)

## 2009-10-25 ENCOUNTER — Encounter: Payer: Self-pay | Admitting: Family Medicine

## 2009-10-27 ENCOUNTER — Ambulatory Visit: Payer: Self-pay | Admitting: Internal Medicine

## 2009-10-31 ENCOUNTER — Encounter: Payer: Self-pay | Admitting: Family Medicine

## 2009-10-31 LAB — CBC WITH DIFFERENTIAL/PLATELET
BASO%: 0.1 % (ref 0.0–2.0)
LYMPH%: 10.2 % — ABNORMAL LOW (ref 14.0–49.7)
MCHC: 32.5 g/dL (ref 31.5–36.0)
MCV: 87.3 fL (ref 79.5–101.0)
MONO#: 0.4 10*3/uL (ref 0.1–0.9)
MONO%: 5.4 % (ref 0.0–14.0)
Platelets: 406 10*3/uL — ABNORMAL HIGH (ref 145–400)
RBC: 4.34 10*6/uL (ref 3.70–5.45)
WBC: 7.9 10*3/uL (ref 3.9–10.3)
nRBC: 0 % (ref 0–0)

## 2009-10-31 LAB — COMPREHENSIVE METABOLIC PANEL
ALT: 12 U/L (ref 0–35)
Albumin: 4.2 g/dL (ref 3.5–5.2)
CO2: 20 mEq/L (ref 19–32)
Potassium: 4.2 mEq/L (ref 3.5–5.3)
Sodium: 140 mEq/L (ref 135–145)
Total Bilirubin: 0.3 mg/dL (ref 0.3–1.2)
Total Protein: 6.7 g/dL (ref 6.0–8.3)

## 2009-11-21 ENCOUNTER — Encounter: Payer: Self-pay | Admitting: Family Medicine

## 2009-11-21 LAB — CBC WITH DIFFERENTIAL/PLATELET
BASO%: 0 % (ref 0.0–2.0)
Eosinophils Absolute: 0 10*3/uL (ref 0.0–0.5)
HCT: 37.5 % (ref 34.8–46.6)
MCHC: 32 g/dL (ref 31.5–36.0)
MONO#: 0.4 10*3/uL (ref 0.1–0.9)
NEUT#: 7 10*3/uL — ABNORMAL HIGH (ref 1.5–6.5)
RBC: 4.28 10*6/uL (ref 3.70–5.45)
WBC: 8.2 10*3/uL (ref 3.9–10.3)
lymph#: 0.8 10*3/uL — ABNORMAL LOW (ref 0.9–3.3)
nRBC: 0 % (ref 0–0)

## 2009-11-21 LAB — COMPREHENSIVE METABOLIC PANEL
ALT: 13 U/L (ref 0–35)
AST: 14 U/L (ref 0–37)
Albumin: 4.2 g/dL (ref 3.5–5.2)
CO2: 21 mEq/L (ref 19–32)
Calcium: 9.4 mg/dL (ref 8.4–10.5)
Chloride: 107 mEq/L (ref 96–112)
Potassium: 4 mEq/L (ref 3.5–5.3)
Total Protein: 6.6 g/dL (ref 6.0–8.3)

## 2009-12-08 ENCOUNTER — Ambulatory Visit: Payer: Self-pay | Admitting: Internal Medicine

## 2009-12-08 ENCOUNTER — Ambulatory Visit (HOSPITAL_COMMUNITY)
Admission: RE | Admit: 2009-12-08 | Discharge: 2009-12-08 | Payer: Self-pay | Source: Home / Self Care | Admitting: Internal Medicine

## 2009-12-12 ENCOUNTER — Encounter: Payer: Self-pay | Admitting: Family Medicine

## 2009-12-12 LAB — CBC WITH DIFFERENTIAL/PLATELET
BASO%: 0.1 % (ref 0.0–2.0)
EOS%: 0 % (ref 0.0–7.0)
Eosinophils Absolute: 0 10*3/uL (ref 0.0–0.5)
MCH: 29.2 pg (ref 25.1–34.0)
MCHC: 33.8 g/dL (ref 31.5–36.0)
MCV: 86.3 fL (ref 79.5–101.0)
MONO%: 6.6 % (ref 0.0–14.0)
NEUT#: 7.1 10*3/uL — ABNORMAL HIGH (ref 1.5–6.5)
RBC: 4.1 10*6/uL (ref 3.70–5.45)
RDW: 16.2 % — ABNORMAL HIGH (ref 11.2–14.5)

## 2009-12-12 LAB — COMPREHENSIVE METABOLIC PANEL
ALT: 14 U/L (ref 0–35)
AST: 14 U/L (ref 0–37)
Albumin: 4.5 g/dL (ref 3.5–5.2)
Alkaline Phosphatase: 70 U/L (ref 39–117)
Potassium: 4.4 mEq/L (ref 3.5–5.3)
Sodium: 142 mEq/L (ref 135–145)
Total Protein: 7.1 g/dL (ref 6.0–8.3)

## 2010-01-02 ENCOUNTER — Encounter: Payer: Self-pay | Admitting: Family Medicine

## 2010-01-17 ENCOUNTER — Ambulatory Visit
Admission: RE | Admit: 2010-01-17 | Discharge: 2010-01-17 | Payer: Self-pay | Source: Home / Self Care | Attending: Family Medicine | Admitting: Family Medicine

## 2010-01-17 ENCOUNTER — Other Ambulatory Visit: Payer: Self-pay | Admitting: Family Medicine

## 2010-01-17 LAB — MICROALBUMIN / CREATININE URINE RATIO
Creatinine,U: 171.9 mg/dL
Microalb Creat Ratio: 0.6 mg/g (ref 0.0–30.0)
Microalb, Ur: 1.1 mg/dL (ref 0.0–1.9)

## 2010-01-17 LAB — HEPATIC FUNCTION PANEL
ALT: 27 U/L (ref 0–35)
AST: 26 U/L (ref 0–37)
Albumin: 3.6 g/dL (ref 3.5–5.2)
Alkaline Phosphatase: 72 U/L (ref 39–117)
Bilirubin, Direct: 0.1 mg/dL (ref 0.0–0.3)
Total Bilirubin: 0.6 mg/dL (ref 0.3–1.2)
Total Protein: 6.4 g/dL (ref 6.0–8.3)

## 2010-01-17 LAB — BASIC METABOLIC PANEL
BUN: 14 mg/dL (ref 6–23)
CO2: 29 mEq/L (ref 19–32)
Calcium: 9.1 mg/dL (ref 8.4–10.5)
Chloride: 107 mEq/L (ref 96–112)
Creatinine, Ser: 0.6 mg/dL (ref 0.4–1.2)
GFR: 118.39 mL/min (ref >60.00)
Glucose, Bld: 97 mg/dL (ref 70–99)
Potassium: 4 mEq/L (ref 3.5–5.1)
Sodium: 142 mEq/L (ref 135–145)

## 2010-01-17 LAB — LIPID PANEL
Cholesterol: 158 mg/dL (ref 0–200)
HDL: 35.6 mg/dL — ABNORMAL LOW (ref >39.00)
LDL Cholesterol: 92 mg/dL (ref 0–99)
Total CHOL/HDL Ratio: 4
Triglycerides: 154 mg/dL — ABNORMAL HIGH (ref 0.0–149.0)
VLDL: 30.8 mg/dL (ref 0.0–40.0)

## 2010-01-17 LAB — HEMOGLOBIN A1C: Hgb A1c MFr Bld: 6.8 % — ABNORMAL HIGH (ref 4.6–6.5)

## 2010-01-19 ENCOUNTER — Ambulatory Visit: Payer: Self-pay | Admitting: Internal Medicine

## 2010-01-22 ENCOUNTER — Ambulatory Visit
Admission: RE | Admit: 2010-01-22 | Discharge: 2010-01-22 | Payer: Self-pay | Source: Home / Self Care | Attending: Family Medicine | Admitting: Family Medicine

## 2010-01-23 ENCOUNTER — Encounter: Payer: Self-pay | Admitting: Family Medicine

## 2010-01-23 LAB — CBC WITH DIFFERENTIAL/PLATELET
BASO%: 0.2 % (ref 0.0–2.0)
Basophils Absolute: 0 10*3/uL (ref 0.0–0.1)
EOS%: 0.4 % (ref 0.0–7.0)
Eosinophils Absolute: 0 10*3/uL (ref 0.0–0.5)
HCT: 36.7 % (ref 34.8–46.6)
HGB: 11.8 g/dL (ref 11.6–15.9)
LYMPH%: 21.9 % (ref 14.0–49.7)
MCH: 27.9 pg (ref 25.1–34.0)
MCHC: 32.2 g/dL (ref 31.5–36.0)
MCV: 86.8 fL (ref 79.5–101.0)
MONO#: 1 10*3/uL — ABNORMAL HIGH (ref 0.1–0.9)
MONO%: 12.2 % (ref 0.0–14.0)
NEUT#: 5.4 10*3/uL (ref 1.5–6.5)
NEUT%: 65.3 % (ref 38.4–76.8)
Platelets: 357 10*3/uL (ref 145–400)
RBC: 4.23 10*6/uL (ref 3.70–5.45)
RDW: 16.2 % — ABNORMAL HIGH (ref 11.2–14.5)
WBC: 8.2 10*3/uL (ref 3.9–10.3)
lymph#: 1.8 10*3/uL (ref 0.9–3.3)
nRBC: 0 % (ref 0–0)

## 2010-01-23 LAB — COMPREHENSIVE METABOLIC PANEL
ALT: 10 U/L (ref 0–35)
AST: 13 U/L (ref 0–37)
Albumin: 3.4 g/dL — ABNORMAL LOW (ref 3.5–5.2)
Alkaline Phosphatase: 64 U/L (ref 39–117)
BUN: 5 mg/dL — ABNORMAL LOW (ref 6–23)
CO2: 27 mEq/L (ref 19–32)
Calcium: 8.7 mg/dL (ref 8.4–10.5)
Chloride: 104 mEq/L (ref 96–112)
Creatinine, Ser: 0.55 mg/dL (ref 0.40–1.20)
Glucose, Bld: 150 mg/dL — ABNORMAL HIGH (ref 70–99)
Potassium: 3.8 mEq/L (ref 3.5–5.3)
Sodium: 139 mEq/L (ref 135–145)
Total Bilirubin: 0.6 mg/dL (ref 0.3–1.2)
Total Protein: 5.4 g/dL — ABNORMAL LOW (ref 6.0–8.3)

## 2010-01-26 ENCOUNTER — Other Ambulatory Visit: Payer: Self-pay | Admitting: Internal Medicine

## 2010-01-26 DIAGNOSIS — C3490 Malignant neoplasm of unspecified part of unspecified bronchus or lung: Secondary | ICD-10-CM

## 2010-01-28 ENCOUNTER — Encounter: Payer: Self-pay | Admitting: Internal Medicine

## 2010-02-06 NOTE — Letter (Signed)
Summary: [facility]  [facility]   Imported By: Maryln Gottron 12/04/2009 14:13:18  _____________________________________________________________________  External Attachment:    Type:   Image     Comment:   External Document

## 2010-02-06 NOTE — Progress Notes (Signed)
Summary: not any better  Phone Note Call from Patient Call back at Home Phone [phone]   Caller: Patient Call For: Dr. Hetty Ely Reason for Call: Acute Illness Summary of Call: Pt was seen on tuesday for an ear infection and was told to use afrin spray.  She says this is not helping at all.  Still has congestion, teeth are hurting, ear still hurting.  She is asking for an antibiotic to be [facility] [address]. Initial call taken by: Lowella Petties CMA,  June 15, 2009 8:57 AM  Follow-up for Phone Call        Take Amox for two weeks.  Take Guaifenesin by [facility] or [facility] and getting MUCOUS RELIEF EXPECTORANT (400mg ), take 11/2 tabs by mouth AM and NOON. Drink lots of fluids anytime taking Guaifenesin.  Cont other measures as discussed when here. If no impr, consider being seen again for imaging study. Follow-up by: Shaune Leeks MD,  June 15, 2009 11:31 AM  Additional Follow-up for Phone Call Additional follow up Details #1::        [facility] for pt to call back.             Lowella Petties CMA  June 15, 2009 12:00 PM  Advised pt. Additional Follow-up by: Lowella Petties CMA,  June 16, 2009 9:16 AM    New/Updated Medications: AMOXICILLIN 500 MG CAPS (AMOXICILLIN) 2 tabs by mouth two times a day Prescriptions: AMOXICILLIN 500 MG CAPS (AMOXICILLIN) 2 tabs by mouth two times a day  #56 x 0   Entered and Authorized by:   Shaune Leeks MD   Signed by:   Shaune Leeks MD on 06/15/2009   Method used:   [facility]        [facility]  [address] [facility]       [address]       [place], [place]  [address]       Ph: [phone] or [phone]       Fax: [phone]   RxID:   [id number]

## 2010-02-06 NOTE — Letter (Signed)
[facility]   Imported By: Lanelle Bal 04/14/2009 08:00:24  _____________________________________________________________________  External Attachment:    Type:   Image     Comment:   External Document

## 2010-02-06 NOTE — Letter (Signed)
Summary: [facility]  [facility]   Imported By: Lanelle Bal 02/24/2009 12:13:29  _____________________________________________________________________  External Attachment:    Type:   Image     Comment:   External Document

## 2010-02-06 NOTE — Assessment & Plan Note (Signed)
Summary: 5 M F/U DLO   Vital Signs:  Patient profile:   60 year old female Height:      63.75 inches Weight:      148.50 pounds BMI:     25.78 Temp:     97.6 degrees F oral Pulse rate:   76 / minute Pulse rhythm:   regular BP sitting:   110 / 62  (left arm) Cuff size:   regular  Vitals Entered By: Linde Gillis CMA Duncan Dull) (April 07, 2009 8:37 AM) CC: 3 month follow up, Hypertension Management   History of Present Illness: DM, well controlled: Has been working on [employer].  Has lost some weight but has been trying to do so.  HTN, well controlled: At goal <130/80.  Lung cancer, metastatic.Marland Kitchen MRI brain..with changes but was thought NOTt to represent tumor recurrence at this time.  Given lung cancer, matastatic she is not interested in in other cancer eval...feels she has been evaluated enough. not interested in CPX.  Sees ONc regularly.  Chemo 3 days ago... recieves every 3 weeks. CT scan of lungs and abdomen/pelvis stable.   Pain well controlled with morphine..some constipation..hard stool.  Sleeping well. Using alprazolam   Hypertension History:      She complains of dyspnea with exertion, but denies headache, chest pain, palpitations, neurologic problems, syncope, and side effects from treatment.        Positive major cardiovascular risk factors include female age 3 years old or older, diabetes, hyperlipidemia, and hypertension.  Negative major cardiovascular risk factors include non-tobacco-user status.     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 Take  One Tab Once Daily 2)  Amlodipine Besylate 10 Mg Tabs (Amlodipine Besylate) .... Take One Tab Once Daily 3)  Prochlorperazine Maleate 10 Mg Tabs (Prochlorperazine Maleate) .... Take One Tablet Every Six Hours As Needed 4)  Morphine Sulfate Cr 60 Mg Xr12h-Tab (Morphine Sulfate) .... Take One Tab Every 12 Hours 5)  Dexamethasone 4 Mg Tabs (Dexamethasone) .... Take One Tablet Two Times A Day A Day Before, Day Of, Day After Chemotherapy 6)  Trazodone Hcl 50 Mg Tabs (Trazodone Hcl) .Marland Kitchen.. 1 Tab By Mouth At Bedtime 7)  Morphine Sulfate Cr 15 Mg Xr12h-Tab (Morphine Sulfate) .... Take On Tablet Every 6 Hours As Needed 8)  Lisinopril 5 Mg Tabs (Lisinopril) .Marland Kitchen.. 1 Tab By Mouth Daily 9)  Patanol 0.1 % Soln (Olopatadine Hcl) .Marland Kitchen.. 1 Gtt Two Times A Day 10)  Simvastatin 40 Mg Tabs (Simvastatin) .Marland Kitchen.. 1 Tab By Mouth Daily 11)  Augmentin 875-125 Mg Tabs (Amoxicillin-Pot Clavulanate) .... Take 1 Two Times A Day 12)  Auralgam .... 2 Drops Each Ear As Needed Pain Every 2 Hrs  Allergies (verified): No Known Drug Allergies  Past History:  Past medical, surgical, family and social histories (including risk factors) reviewed, and no changes noted (except as noted below).  Past Medical History: Reviewed history from 01/07/2008 and no changes required. 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 father: DM, lymphoma mother: died of liver and lung cancer age 2 brother:high cholesterol sister:  hypochondriac  Social History: Reviewed history from 01/07/2008 and no changes required. Former Smoker Occupation: disabled Divorced (alcoholic) daughter, 2 sons: healthy Alcohol use-no Drug use-no Regular exercise-yes, walking Diet: fruits and veggies, water  Review of Systems General:  Complains of fatigue; denies fever. CV:  Denies lightheadness. Resp:  Complains of cough and shortness of breath. GI:  Complains of constipation; denies abdominal pain, bloody stools, and diarrhea. GU:  Denies dysuria.  Physical Exam  General:  Well-developed,well-nourished,in no acute distress; alert,appropriate and cooperative throughout examination Mouth:  Oral mucosa and oropharynx without lesions or exudates.  Teeth in moderate repair. Neck:  no carotid bruit or thyromegaly  Lungs:  moist harsh cough,rhonchi intermiitant, clearer lung sound in right upper lung than usual,  no crackles and no wheezes.   Heart:  Normal rate and regular rhythm. S1 and S2 normal without gallop, murmur, click, rub or other extra sounds. Abdomen:  Bowel sounds positive,abdomen soft and non-tender without masses, organomegaly or hernias noted. Pulses:  R and L posterior tibial pulses are full and equal bilaterally  Extremities:  no edema Skin:  3 healing burn, 0.5 cm diameter grease spatters on right hand minimal surrounding erythema , no warmth Psych:  Cognition and judgment appear intact. Alert and cooperative with normal attention span and concentration. No apparent delusions, illusions, hallucinations   Impression & Recommendations:  Problem # 1:  DIABETES MELLITUS, MILD (ICD-250.00) Well controlled with diet and lifestyle. ? worsened/caused by prednisone prior to chemo in past 4 years.  Her updated medication list for this problem includes:    Lisinopril 5 Mg Tabs (Lisinopril) .Marland Kitchen... 1 tab by mouth daily  Problem # 2:  HYPERTENSION (ICD-401.9) Well controlled. Continue current medication.  Her updated  medication list for this problem includes:    Amlodipine Besylate 10 Mg Tabs (Amlodipine besylate) .Marland Kitchen... Take one tab once daily    Lisinopril 5 Mg Tabs (Lisinopril) .Marland Kitchen... 1 tab by mouth daily  Problem # 3:  LUNG CANCER, HX OF (ICD-V10.11) Metastatic. Stable per recent scans. On q3 week checmo.   Problem # 4:  CONSTIPATION (ICD-564.00) Use miralax as needed. Onc following electrolytes closely.  Discussed dietary fiber measures and increased water intake.   Complete Medication List: 1)  Folic Acid 1 Mg Tabs (Folic acid) .... Take one tab once daily 2)  Amlodipine Besylate 10 Mg Tabs (Amlodipine besylate) .... Take one tab once daily 3)  Prochlorperazine Maleate 10 Mg Tabs (Prochlorperazine maleate) .... Take one tablet every six hours as needed 4)  Morphine Sulfate Cr 60 Mg Xr12h-tab (Morphine sulfate) .... Take one tab every 12 hours 5)  Dexamethasone 4 Mg Tabs (Dexamethasone) .... Take one tablet two times a day a day before, day of, day after chemotherapy 6)  Trazodone Hcl 50 Mg Tabs (Trazodone hcl) .Marland Kitchen.. 1 tab by mouth at bedtime 7)  Morphine Sulfate Cr 15 Mg Xr12h-tab (Morphine sulfate) .... Take on tablet every 6 hours as needed 8)  Lisinopril 5 Mg Tabs (Lisinopril) .Marland Kitchen.. 1 tab by mouth daily 9)  Patanol  0.1 % Soln (Olopatadine hcl) .Marland Kitchen.. 1 gtt two times a day 10)  Simvastatin 40 Mg Tabs (Simvastatin) .Marland Kitchen.. 1 tab by mouth daily 11)  Augmentin 875-125 Mg Tabs (Amoxicillin-pot clavulanate) .... Take 1 two times a day 12)  Auralgam  .... 2 drops each ear as needed pain every 2 hrs  Hypertension Assessment/Plan:      The patient's hypertensive risk group is category C: Target organ damage and/or diabetes.  Her calculated 10 year risk of coronary heart disease is 15 %.  Today's blood pressure is 110/62.  Her blood pressure goal is < 140/90.  Patient Instructions: 1)  Miralax for consitpation as needed. Increaing fiber and weater in diet. Exercise as well.  2)  Please schedule a follow-up  appointment in 3 months  DM 3)  HgBA1c prior to visit  ICD-9: 250.00  Current Allergies (reviewed today): No known allergies

## 2010-02-06 NOTE — Letter (Signed)
Summary: [facility]  [facility]   Imported By: Lanelle Bal 01/23/2009 11:25:39  _____________________________________________________________________  External Attachment:    Type:   Image     Comment:   External Document

## 2010-02-06 NOTE — Letter (Signed)
Summary: [facility]  [facility]   Imported By: Lanelle Bal 09/20/2009 09:08:24  _____________________________________________________________________  External Attachment:    Type:   Image     Comment:   External Document

## 2010-02-06 NOTE — Letter (Signed)
[facility]   Imported By: Lanelle Bal 02/02/2009 10:08:46  _____________________________________________________________________  External Attachment:    Type:   Image     Comment:   External Document

## 2010-02-06 NOTE — Assessment & Plan Note (Signed)
Summary: 3 m f/u dm   Vital Signs:  Patient profile:   60 year old female Height:      63.75 inches Weight:      161.8 pounds BMI:     28.09 Temp:     97.9 degrees F oral Pulse rate:   80 / minute Pulse rhythm:   regular BP sitting:   100 / 60  (left arm) Cuff size:   regular  Vitals Entered By: Benny Lennert CMA Duncan Dull) (July 07, 2009 8:15 AM)  History of Present Illness: Chief complaint 3 month followup DM  DM, well controlled with diet. recent steroids with chemotherapy.  recent OM...symptoms resolved s/p antibiotics.  Lung cancer, metastatic.Marland Kitchen MRI brain..with changes but was thought NOt to represent tumor recurrence at this time.  Given lung cancer, metastatic she is not interested in in other cancer eval...feels she has been evaluated enough. not interested in CPX..."I already have cancer...i don't want to look for more"  Sees ONC regularly.  Chemo recieves every 3 weeks. CT scan of lungs and abdomen/pelvis stable.   Pain well controlled with morphine..some constipation..hard stool.  Sleeping well. 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 Take One Tab Once Daily 2)  Amlodipine Besylate 10 Mg Tabs (Amlodipine Besylate) .... Take One Tab Once Daily 3)  Prochlorperazine Maleate 10 Mg Tabs (Prochlorperazine Maleate) .... Take One Tablet Every Six Hours As Needed 4)  Morphine Sulfate Cr 60 Mg Xr12h-Tab (Morphine  Sulfate) .... Take One Tab Every 12 Hours 5)  Dexamethasone 4 Mg Tabs (Dexamethasone) .... Take One Tablet Two Times A Day A Day Before, Day Of, Day After Chemotherapy 6)  Trazodone Hcl 50 Mg Tabs (Trazodone Hcl) .Marland Kitchen.. 1 Tab By Mouth At Bedtime 7)  Morphine Sulfate Cr 15 Mg Xr12h-Tab (Morphine Sulfate) .... Take On Tablet Every 6 Hours As Needed 8)  Lisinopril 5 Mg Tabs (Lisinopril) .Marland Kitchen.. 1 Tab By Mouth Daily 9)  Patanol 0.1 % Soln (Olopatadine Hcl) .Marland Kitchen.. 1 Gtt Two Times A Day 10)  Simvastatin 40 Mg Tabs (Simvastatin) .Marland Kitchen.. 1 Tab By Mouth Daily 11)  Auralgam .... 2 Drops Each Ear As Needed Pain Every 2 Hrs  Allergies (verified): No Known Drug Allergies  Past History:  Past medical, surgical, family and social histories (including risk factors) reviewed, and no changes noted (except as noted below).  Past Medical History: Reviewed history from 01/07/2008 and no changes required. 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 father: DM, lymphoma mother:  died of liver and lung cancer age 45 brother:high cholesterol sister: hypochondriac  Social History: Reviewed history from 01/07/2008 and no changes required. Former Smoker Occupation: disabled Divorced (alcoholic) daughter, 2 sons: healthy Alcohol use-no Drug use-no Regular exercise-yes, walking Diet: fruits and veggies, water  Review of Systems General:  Complains of fatigue; denies fever. CV:  Denies chest pain or  discomfort. Resp:  stable SOb.Marland Kitchenno longer requiring O2 at bedtime. GI:  Denies abdominal pain, bloody stools, constipation, and diarrhea. GU:  Denies dysuria.  Physical Exam  General:  Well-developed,well-nourished,in no acute distress; alert,appropriate and cooperative throughout examination, no dizziness when seen. Ears:  External ear exam shows no significant lesions or deformities.  Otoscopic examination reveals clear canals, tympanic membranes are intact bilaterally without bulging, retraction, inflammation or discharge. Hearing is grossly normal bilaterally. Mouth:  Oral mucosa and oropharynx without lesions or exudates.  Teeth in moderate repair. Neck:  no carotid bruit or thyromegaly  Lungs:  moist harsh cough,rhonchi intermiitant, clearer lung sound in right upper lung than usual,  no crackles and no wheezes.   Heart:  Normal rate and regular rhythm. S1 and S2 normal without gallop, murmur, click, rub or other extra sounds. Abdomen:  Bowel sounds positive,abdomen soft and non-tender without masses, organomegaly or hernias noted. Pulses:  R and L posterior tibial pulses are full and equal bilaterally  Extremities:  no edema  Diabetes Management Exam:    Foot Exam (with socks and/or shoes not present):       Sensory-Pinprick/Light touch:          Left medial foot (L-4): normal          Left dorsal foot (L-5): normal          Left lateral foot (S-1): normal          Right medial foot (L-4): normal          Right dorsal foot (L-5): normal          Right lateral foot (S-1): normal       Sensory-Monofilament:          Left foot: normal          Right foot: normal       Inspection:          Left foot: normal          Right foot: normal       Nails:          Left foot: normal          Right foot: normal    Eye Exam:       Eye Exam done elsewhere          Date: 07/07/2009          Results: normal          Done by: ey MD    Impression & Recommendations:  Problem # 1:   DIABETES MELLITUS, MILD (ICD-250.00) Well controlled with lifestyle. Encouraged exercise, weight loss, healthy eating habits.  Her updated medication list for this problem includes:    Lisinopril 5 Mg Tabs (Lisinopril) .Marland Kitchen... 1 tab by mouth daily  Problem # 2:  LUNG CANCER, HX OF (ICD-V10.11) Stable metastatic on chemo.  Complete Medication List: 1)  Folic Acid 1 Mg Tabs (Folic acid) .... Take one tab once daily 2)  Amlodipine Besylate 10 Mg Tabs (Amlodipine besylate) .... Take one tab once daily 3)  Prochlorperazine Maleate 10 Mg Tabs (Prochlorperazine maleate) .... Take one tablet every six  hours as needed 4)  Morphine Sulfate Cr 60 Mg Xr12h-tab (Morphine sulfate) .... Take one tab every 12 hours 5)  Dexamethasone 4 Mg Tabs (Dexamethasone) .... Take one tablet two times a day a day before, day of, day after chemotherapy 6)  Trazodone Hcl 50 Mg Tabs (Trazodone hcl) .Marland Kitchen.. 1 tab by mouth at bedtime 7)  Morphine Sulfate Cr 15 Mg Xr12h-tab (Morphine sulfate) .... Take on tablet every 6 hours as needed 8)  Lisinopril 5 Mg Tabs (Lisinopril) .Marland Kitchen.. 1 tab by mouth daily 9)  Patanol 0.1 % Soln (Olopatadine hcl) .Marland Kitchen.. 1 gtt two times a day 10)  Simvastatin 40 Mg Tabs (Simvastatin) .Marland Kitchen.. 1 tab by mouth daily 11)  Auralgam  .... 2 drops each ear as needed pain every 2 hrs  Patient Instructions: 1)  Please schedule a follow-up appointment in 6 months  DM 2)  BMP prior to visit, ICD-9: 250.00 3)  Hepatic Panel prior to visit ICD-9:  4)  Lipid panel prior to visit ICD-9 :  5)  HgBA1c prior to visit  ICD-9:  6)  Urine Microalbumin prior to visit ICD-9 :   Current Allergies (reviewed today): No known allergies

## 2010-02-06 NOTE — Progress Notes (Signed)
Summary: ? ear infection  Phone Note Call from Patient Call back at Home Phone [phone]   Caller: Patient Call For: Dr. Patsy Lager Summary of Call: Patient says she has an ear infection that she has had in the past.  It has been bothering her for 3-4 days.  She has just had a chemo treatment and has not felt like calling or coming in.  Can she get ABX for it?  [facility] [place] Initial call taken by: Delilah Shan CMA Duncan Dull),  June 12, 2009 2:16 PM  Follow-up for Phone Call        Would rec eval. We generally do not call in abx.  it is ok to say no we do not call in abx. and arrange f/u Follow-up by: Hannah Beat MD,  June 12, 2009 2:27 PM  Additional Follow-up for Phone Call Additional follow up Details #1::        Patient Advised.   Zella Ball is setting up appt. Additional Follow-up by: Delilah Shan CMA Duncan Dull),  June 12, 2009 2:47 PM

## 2010-02-06 NOTE — Letter (Signed)
Summary: [facility]  [facility]   Imported By: Lanelle Bal 01/23/2009 11:30:39  _____________________________________________________________________  External Attachment:    Type:   Image     Comment:   External Document

## 2010-02-06 NOTE — Assessment & Plan Note (Signed)
Summary: RIGHT EAR ACHE - EQUALIBRIUM IS OFF - PER LUGENE / LFW   Vital Signs:  Patient profile:   60 year old female Height:      63.75 inches Weight:      158 pounds BMI:     27.43 Temp:     98.2 degrees F oral Pulse rate:   80 / minute Pulse rhythm:   regular BP sitting:   112 / 70  (left arm) Cuff size:   regular  Vitals Entered By: Linde Gillis CMA Duncan Dull) (June 13, 2009 3:36 PM) CC: ear pain   History of Present Illness: Pt thinks sher has an inner ear infection. She keeps gonig to the right when she gets up and it is hard to hear from the right ear. She is not dizzy otherwise. She has not had fever or chills, no pian in lewft ear, some rhinitis with congestion....slept with the fan blowing on her the other night with her son....no ST, mild cough nonproductive. No SOB, no N/V. She goes to hosp if has SOB.  She has had lung cancer. She has had Gamma knife surgery 3 years ago w/o recurrence. She is due followup head exam in the 17th.  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 Take One Tab Once Daily 2)  Amlodipine Besylate 10 Mg Tabs (Amlodipine Besylate) .... Take One Tab Once Daily 3)  Prochlorperazine Maleate 10 Mg Tabs (Prochlorperazine Maleate) .... Take One Tablet Every Six Hours As Needed 4)  Morphine Sulfate Cr 60 Mg Xr12h-Tab (Morphine Sulfate) .... Take One Tab Every 12 Hours 5)  Dexamethasone 4 Mg Tabs (Dexamethasone) .... Take One Tablet Two Times A Day A Day Before, Day Of, Day After Chemotherapy 6)  Trazodone Hcl 50 Mg  Tabs (Trazodone Hcl) .Marland Kitchen.. 1 Tab By Mouth At Bedtime 7)  Morphine Sulfate Cr 15 Mg Xr12h-Tab (Morphine Sulfate) .... Take On Tablet Every 6 Hours As Needed 8)  Lisinopril 5 Mg Tabs (Lisinopril) .Marland Kitchen.. 1 Tab By Mouth Daily 9)  Patanol 0.1 % Soln (Olopatadine Hcl) .Marland Kitchen.. 1 Gtt Two Times A Day 10)  Simvastatin 40 Mg Tabs (Simvastatin) .Marland Kitchen.. 1 Tab By Mouth Daily 11)  Augmentin 875-125 Mg Tabs (Amoxicillin-Pot Clavulanate) .... Take 1 Two Times A Day 12)  Auralgam .... 2 Drops Each Ear As Needed Pain Every 2 Hrs  Allergies (verified): No Known Drug Allergies  Physical Exam  General:  Well-developed,well-nourished,in no acute distress; alert,appropriate and cooperative throughout examination, no dizziness when seen. Head:  Normocephalic and atraumatic without obvious abnormalities. No apparent alopecia or balding. Sinuses NT. Eyes:  Conjunctiva clear bilaterally. No nystagmus seen. Ears:  External ear exam shows no significant lesions or deformities.  Otoscopic examination reveals clear canals, tympanic membranes are intact bilaterally without bulging, retraction, inflammation or discharge. Hearing is grossly normal bilaterally. No fluid seen, no obvious abnormality. Nose:  External nasal examination shows no deformity or inflammation. Nasal mucosa are pink and moist without lesions or exudates. Mouth:  Oral mucosa and oropharynx without lesions or exudates.  Teeth in  moderate repair. Neck:  no carotid bruit or thyromegaly  Lungs:  moist harsh cough,rhonchi intermiitant, clearer lung sound in right upper lung than usual,  no crackles and no wheezes.   Heart:  Normal rate and regular rhythm. S1 and S2 normal without gallop, murmur, click, rub or other extra sounds. Neurologic:  No cranial nerve deficits noted. Station and gait are normal.  Sensory, motor and coordinative functions appear intact.   Impression & Recommendations:  Problem # 1:  BENIGN POSITIONAL VERTIGO (ICD-386.11) Assessment  New  Discussed pathpophysiology and trmt. See instructions. This combo os s/s with medical history could be ominous...keep appt for head exam.  Problem # 2:  DYSFUNCTION OF EUSTACHIAN TUBE (ICD-381.81) Assessment: New See instructions.  Complete Medication List: 1)  Folic Acid 1 Mg Tabs (Folic acid) .... Take one tab once daily 2)  Amlodipine Besylate 10 Mg Tabs (Amlodipine besylate) .... Take one tab once daily 3)  Prochlorperazine Maleate 10 Mg Tabs (Prochlorperazine maleate) .... Take one tablet every six hours as needed 4)  Morphine Sulfate Cr 60 Mg Xr12h-tab (Morphine sulfate) .... Take one tab every 12 hours 5)  Dexamethasone 4 Mg Tabs (Dexamethasone) .... Take one tablet two times a day a day before, day of, day after chemotherapy 6)  Trazodone Hcl 50 Mg Tabs (Trazodone hcl) .Marland Kitchen.. 1 tab by mouth at bedtime 7)  Morphine Sulfate Cr 15 Mg Xr12h-tab (Morphine sulfate) .... Take on tablet every 6 hours as needed 8)  Lisinopril 5 Mg Tabs (Lisinopril) .Marland Kitchen.. 1 tab by mouth daily 9)  Patanol 0.1 % Soln (Olopatadine hcl) .Marland Kitchen.. 1 gtt two times a day 10)  Simvastatin 40 Mg Tabs (Simvastatin) .Marland Kitchen.. 1 tab by mouth daily 11)  Auralgam  .... 2 drops each ear as needed pain every 2 hrs  Patient Instructions: 1)  Afrin three  squirts one minute apart three times a day for three days. 2)  Lots of fluids. 3)  Aleve 2 tabs after brfst and supper for one week. 4)  If no better in one week, Google vertigo for exercises. 5)  Keep appt for head exam 17th.  Current Allergies (reviewed today): No known allergies

## 2010-02-06 NOTE — Letter (Signed)
Summary: [facility]  [facility]   Imported By: Lanelle Bal 10/03/2009 08:54:56  _____________________________________________________________________  External Attachment:    Type:   Image     Comment:   External Document

## 2010-02-06 NOTE — Letter (Signed)
[facility]   Imported By: Lester [place] 06/24/2009 08:34:45  _____________________________________________________________________  External Attachment:    Type:   Image     Comment:   External Document

## 2010-02-06 NOTE — Letter (Signed)
Summary: [facility]  [facility]   Imported By: Lanelle Bal 11/20/2009 12:25:27  _____________________________________________________________________  External Attachment:    Type:   Image     Comment:   External Document

## 2010-02-06 NOTE — Letter (Signed)
[facility]   Imported By: Lanelle Bal 08/03/2009 08:52:06  _____________________________________________________________________  External Attachment:    Type:   Image     Comment:   External Document

## 2010-02-06 NOTE — Letter (Signed)
Summary: [facility]  [facility]   Imported By: Lanelle Bal 11/06/2009 11:55:12  _____________________________________________________________________  External Attachment:    Type:   Image     Comment:   External Document

## 2010-02-06 NOTE — Letter (Signed)
Summary: [facility]  [facility]   Imported By: Lanelle Bal 06/30/2009 10:13:06  _____________________________________________________________________  External Attachment:    Type:   Image     Comment:   External Document

## 2010-02-06 NOTE — Letter (Signed)
[facility]   Imported By: Lanelle Bal 08/02/2009 09:20:53  _____________________________________________________________________  External Attachment:    Type:   Image     Comment:   External Document

## 2010-02-06 NOTE — Letter (Signed)
[facility]   Imported By: Maryln Gottron 08/22/2009 12:36:20  _____________________________________________________________________  External Attachment:    Type:   Image     Comment:   External Document

## 2010-02-06 NOTE — Letter (Signed)
[facility]   Imported By: Lester [place] 06/24/2009 08:37:32  _____________________________________________________________________  External Attachment:    Type:   Image     Comment:   External Document

## 2010-02-06 NOTE — Letter (Signed)
[facility]   Imported By: Lanelle Bal 02/14/2009 10:13:06  _____________________________________________________________________  External Attachment:    Type:   Image     Comment:   External Document

## 2010-02-06 NOTE — Letter (Signed)
[facility]   Imported By: Lanelle Bal 08/03/2009 08:45:17  _____________________________________________________________________  External Attachment:    Type:   Image     Comment:   External Document

## 2010-02-06 NOTE — Letter (Signed)
[facility]   Imported By: Lanelle Bal 08/02/2009 13:17:55  _____________________________________________________________________  External Attachment:    Type:   Image     Comment:   External Document

## 2010-02-06 NOTE — Letter (Signed)
Summary: [facility]  [facility]   Imported By: Sherian Rein 10/19/2009 10:36:13  _____________________________________________________________________  External Attachment:    Type:   Image     Comment:   External Document

## 2010-02-06 NOTE — Letter (Signed)
[facility]   Imported By: Lanelle Bal 08/02/2009 09:23:24  _____________________________________________________________________  External Attachment:    Type:   Image     Comment:   External Document

## 2010-02-08 NOTE — Assessment & Plan Note (Signed)
Summary: 6 MONTH FOLLOW-UP/JRR   Vital Signs:  Patient profile:   60 year old female Height:      63.75 inches Weight:      166.0 pounds BMI:     28.82 Temp:     98.8 degrees F oral Pulse rate:   80 / minute Pulse rhythm:   regular BP sitting:   120 / 70  (left arm) Cuff size:   regular  Vitals Entered By: Benny Lennert CMA Duncan Dull) (January 22, 2010 8:19 AM)  History of Present Illness: Chief complaint 6 month follow up   DM, well controlled with diet but slightly worsening. recent steroids with chemotherapy.  Drinking Coke.. eating a lot over holidays.   ON ACEI.   On steroids for premed of chemotherapy.Cameron Sprang plans on trying to come of these given SE (sweating, ill feeling)  High cholesterol.The patient is here for annual wellness exam and preventative care.    controlled on simvastatin 40 mg daily.  Lung cancer, metastatic.Marland Kitchen MRI brain..with changes but was thought NOt to represent tumor recurrence at this time.  Given lung cancer, metastatic she is not interested in in other cancer eval...feels she has been evaluated enough. not interested in CPX..."I already have cancer...i don't want to look for more"  Sees ONC regularly.  Chemo recieves every 3 weeks. CT scan of lungs and abdomen/pelvis stable at last check.   Pain well controlled with morphine..some constipation..hard stool.  Sleeping well. Using alprazolam.    Hypertension History:      She denies headache, chest pain, peripheral edema, syncope, and side effects from treatment.  No falls High BP well controlled. Marland Kitchen        Positive major cardiovascular risk factors include female age 45 years old or older, diabetes, hyperlipidemia, and hypertension.  Negative major cardiovascular risk factors include non-tobacco-user status.     Allergies (verified): No Known Drug Allergies  Past History:  Past medical, surgical, family and social histories (including risk factors) reviewed, and no changes noted (except as noted  below).  Past Medical History: Reviewed history from 01/07/2008 and no changes required. 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 father: DM, lymphoma mother: died of liver and lung cancer age 75 brother:high cholesterol sister: hypochondriac  Social History: Reviewed history from 01/07/2008 and no changes required. Former Smoker Occupation: disabled Divorced (alcoholic) daughter, 2 sons: healthy Alcohol use-no Drug use-no Regular exercise-yes, walking Diet: fruits and veggies, water  Review of Systems General:  Denies fatigue and fever. CV:  Denies chest pain or discomfort. Resp:  no change in SOB. GI:  Complains of constipation; on iron  QOD and morhine. .  Physical Exam  General:  Well-developed,well-nourished,in no acute distress; alert,appropriate and cooperative throughout examination Mouth:  Oral mucosa and oropharynx without lesions or exudates.  Teeth in good repair. Neck:  no carotid bruit or thyromegaly no cervical or supraclavicular lymphadenopathy  Lungs:  moist harsh cough,rhonchi intermiitant, fairly clear lung sounds in right upper lung than usual,  no crackles and no wheezes.   Heart:  Normal rate and regular rhythm. S1 and S2 normal without gallop, murmur, click, rub or other extra sounds. Abdomen:  Bowel sounds positive,abdomen soft and non-tender without masses, organomegaly or hernias noted. Pulses:  R and L posterior tibial pulses are  full and equal bilaterally  Extremities:  no edema  Diabetes Management Exam:    Foot Exam (with socks and/or shoes not present):       Sensory-Pinprick/Light touch:          Left medial foot (L-4): normal          Left dorsal foot (L-5): normal          Left lateral foot (S-1): normal          Right medial foot (L-4): normal          Right dorsal foot (L-5): normal          Right lateral foot (S-1): normal       Sensory-Monofilament:          Left foot: normal          Right foot: normal       Inspection:          Left foot: normal          Right foot: normal       Nails:          Left foot: normal          Right foot: normal   Impression & Recommendations:  Problem # 1:  HYPERCHOLESTEROLEMIA (ICD-272.0) Well controlled. Continue current medication.  Her updated medication list for this problem includes:    Simvastatin 40 Mg Tabs (Simvastatin) .Marland Kitchen... 1 tab by mouth daily  Labs Reviewed: SGOT: 26 (01/17/2010)   SGPT: 27 (01/17/2010)  Lipid Goals: Chol Goal: 200 (01/05/2009)   HDL Goal: 40 (01/05/2009)   LDL Goal: 100 (01/05/2009)   TG Goal: 150 (01/05/2009)  Prior 10 Yr Risk Heart Disease: 15 % (01/05/2009)   HDL:35.60 (01/17/2010), 33.70 (12/26/2008)  LDL:92 (01/17/2010), 94 (12/26/2008)  Chol:158 (01/17/2010), 150 (12/26/2008)  Trig:154.0 (01/17/2010), 113.0 (12/26/2008)  Problem # 2:  DIABETES MELLITUS, MILD (ICD-250.00) Well controlled with diet.. trending up some.. will follow. Encouraged exercise, weight loss, healthy eating habits.  May improve if ONC stopping steroids.  Her updated medication list for this problem includes:    Lisinopril 5 Mg Tabs (Lisinopril) .Marland Kitchen... 1 tab by mouth daily  Problem # 3:  HYPERTENSION (ICD-401.9)  Well controlled. Continue current medication.  Her updated medication list for this problem includes:    Amlodipine Besylate 10 Mg Tabs (Amlodipine besylate) .Marland Kitchen... Take one tab once daily    Lisinopril 5 Mg Tabs (Lisinopril) .Marland Kitchen... 1 tab  by mouth daily  BP today: 120/70 Prior BP: 100/60 (07/07/2009)  Prior 10 Yr Risk Heart Disease: 15 % (01/05/2009)  Labs Reviewed: K+: 4.0 (01/17/2010) Creat: : 0.6 (01/17/2010)   Chol: 158 (01/17/2010)   HDL: 35.60 (01/17/2010)   LDL: 92 (01/17/2010)   TG: 154.0 (01/17/2010)  Problem # 4:  LUNG CANCER, HX OF (ICD-V10.11) Review most recetn ONC note,  Complete Medication List: 1)  Folic Acid 1 Mg Tabs (Folic acid) .... Take one tab once daily 2)  Amlodipine Besylate 10 Mg Tabs (Amlodipine besylate) .... Take one tab once daily 3)  Prochlorperazine Maleate 10 Mg Tabs (Prochlorperazine maleate) .... Take one tablet every six hours as needed 4)  Morphine Sulfate Cr 60 Mg Xr12h-tab (Morphine sulfate) .... Take one tab every 12 hours 5)  Dexamethasone 4 Mg Tabs (Dexamethasone) .... Take one tablet two times a day a day before, day of, day after chemotherapy 6)  Trazodone Hcl 50 Mg Tabs (Trazodone hcl) .Marland Kitchen.. 1 tab by mouth at bedtime 7)  Morphine Sulfate Cr 15 Mg Xr12h-tab (Morphine sulfate) .... Take on tablet every 6 hours as needed 8)  Lisinopril 5 Mg Tabs (Lisinopril) .Marland Kitchen.. 1 tab by mouth daily 9)  Patanol 0.1 % Soln (Olopatadine hcl) .Marland Kitchen.. 1 gtt two times a day 10)  Simvastatin 40 Mg Tabs (Simvastatin) .Marland Kitchen.. 1 tab by mouth daily 11)  Auralgam  .... 2 drops each ear as needed pain every 2 hrs  Hypertension Assessment/Plan:      The patient's hypertensive risk group is category C: Target organ damage and/or diabetes.  Her calculated 10 year risk of coronary heart disease is 15 %.  Today's blood pressure is 120/70.  Her blood pressure goal is < 140/90.  Patient Instructions: 1)  Work on healthy lifestyle.. low carbohydrate and low fat diet. 2)   Try cut back on Cokes. 3)  Please schedule a follow-up appointment in 6 months .  4)  BMP prior to visit, ICD-9: 250.0 5)  Hepatic Panel prior to visit ICD-9:  6)  Lipid panel prior to visit ICD-9 :  7)  HgBA1c prior to visit  ICD-9:    Orders  Added: 1)  Est. Patient Level IV [16109]    Current Allergies (reviewed today): No known allergies

## 2010-02-08 NOTE — Letter (Signed)
Summary: [facility]  [facility]   Imported By: Maryln Gottron 01/09/2010 13:12:27  _____________________________________________________________________  External Attachment:    Type:   Image     Comment:   External Document

## 2010-02-08 NOTE — Letter (Signed)
Summary: [facility]  [facility]   Imported By: Lanelle Bal 12/21/2009 11:45:26  _____________________________________________________________________  External Attachment:    Type:   Image     Comment:   External Document

## 2010-02-08 NOTE — Letter (Signed)
Summary: [facility]  [facility]   Imported By: Maryln Gottron 01/31/2010 13:23:40  _____________________________________________________________________  External Attachment:    Type:   Image     Comment:   External Document

## 2010-02-09 ENCOUNTER — Other Ambulatory Visit (HOSPITAL_COMMUNITY): Payer: Medicare Other

## 2010-02-09 ENCOUNTER — Ambulatory Visit (HOSPITAL_COMMUNITY)
Admission: RE | Admit: 2010-02-09 | Discharge: 2010-02-09 | Disposition: A | Discharge status: Home / Self Care | Payer: Medicare Other | Source: Ambulatory Visit | Attending: Internal Medicine | Admitting: Internal Medicine

## 2010-02-09 ENCOUNTER — Other Ambulatory Visit: Payer: Self-pay | Admitting: Internal Medicine

## 2010-02-09 DIAGNOSIS — J984 Other disorders of lung: Secondary | ICD-10-CM | POA: Insufficient documentation

## 2010-02-09 DIAGNOSIS — Z981 Arthrodesis status: Secondary | ICD-10-CM | POA: Insufficient documentation

## 2010-02-09 DIAGNOSIS — M87059 Idiopathic aseptic necrosis of unspecified femur: Secondary | ICD-10-CM | POA: Insufficient documentation

## 2010-02-09 DIAGNOSIS — I7090 Unspecified atherosclerosis: Secondary | ICD-10-CM | POA: Insufficient documentation

## 2010-02-09 DIAGNOSIS — C3490 Malignant neoplasm of unspecified part of unspecified bronchus or lung: Secondary | ICD-10-CM

## 2010-02-09 DIAGNOSIS — X58XXXA Exposure to other specified factors, initial encounter: Secondary | ICD-10-CM | POA: Insufficient documentation

## 2010-02-09 DIAGNOSIS — S22009A Unspecified fracture of unspecified thoracic vertebra, initial encounter for closed fracture: Secondary | ICD-10-CM | POA: Insufficient documentation

## 2010-02-09 DIAGNOSIS — C786 Secondary malignant neoplasm of retroperitoneum and peritoneum: Secondary | ICD-10-CM | POA: Insufficient documentation

## 2010-02-09 DIAGNOSIS — C7931 Secondary malignant neoplasm of brain: Secondary | ICD-10-CM | POA: Insufficient documentation

## 2010-02-09 MED ORDER — IOHEXOL 300 MG/ML  SOLN
100.0000 mL | Freq: Once | INTRAMUSCULAR | Status: AC | PRN
  Administered 2010-02-09: 100 mL via INTRAVENOUS

## 2010-02-13 ENCOUNTER — Encounter: Payer: Self-pay | Admitting: Family Medicine

## 2010-02-13 ENCOUNTER — Other Ambulatory Visit: Payer: Self-pay | Admitting: Internal Medicine

## 2010-02-13 ENCOUNTER — Encounter (HOSPITAL_BASED_OUTPATIENT_CLINIC_OR_DEPARTMENT_OTHER): Payer: Medicare Other | Admitting: Internal Medicine

## 2010-02-13 DIAGNOSIS — C3410 Malignant neoplasm of upper lobe, unspecified bronchus or lung: Secondary | ICD-10-CM

## 2010-02-13 DIAGNOSIS — C7931 Secondary malignant neoplasm of brain: Secondary | ICD-10-CM

## 2010-02-13 DIAGNOSIS — Z5111 Encounter for antineoplastic chemotherapy: Secondary | ICD-10-CM

## 2010-02-13 DIAGNOSIS — C7952 Secondary malignant neoplasm of bone marrow: Secondary | ICD-10-CM

## 2010-02-13 DIAGNOSIS — C7949 Secondary malignant neoplasm of other parts of nervous system: Secondary | ICD-10-CM

## 2010-02-13 LAB — COMPREHENSIVE METABOLIC PANEL
ALT: 12 U/L (ref 0–35)
AST: 15 U/L (ref 0–37)
Albumin: 3.7 g/dL (ref 3.5–5.2)
Calcium: 9.3 mg/dL (ref 8.4–10.5)
Chloride: 105 mEq/L (ref 96–112)
Potassium: 3.8 mEq/L (ref 3.5–5.3)

## 2010-02-13 LAB — CBC WITH DIFFERENTIAL/PLATELET
BASO%: 0.3 % (ref 0.0–2.0)
EOS%: 0.5 % (ref 0.0–7.0)
MCH: 28.7 pg (ref 25.1–34.0)
MCHC: 33.2 g/dL (ref 31.5–36.0)
RDW: 16.1 % — ABNORMAL HIGH (ref 11.2–14.5)
lymph#: 1.8 10*3/uL (ref 0.9–3.3)

## 2010-03-06 ENCOUNTER — Encounter (HOSPITAL_BASED_OUTPATIENT_CLINIC_OR_DEPARTMENT_OTHER): Payer: Medicare Other | Admitting: Internal Medicine

## 2010-03-06 ENCOUNTER — Other Ambulatory Visit: Payer: Self-pay | Admitting: Internal Medicine

## 2010-03-06 ENCOUNTER — Encounter: Payer: Self-pay | Admitting: Family Medicine

## 2010-03-06 DIAGNOSIS — C7952 Secondary malignant neoplasm of bone marrow: Secondary | ICD-10-CM

## 2010-03-06 DIAGNOSIS — C3410 Malignant neoplasm of upper lobe, unspecified bronchus or lung: Secondary | ICD-10-CM

## 2010-03-06 DIAGNOSIS — Z5111 Encounter for antineoplastic chemotherapy: Secondary | ICD-10-CM

## 2010-03-06 DIAGNOSIS — C7931 Secondary malignant neoplasm of brain: Secondary | ICD-10-CM

## 2010-03-06 LAB — COMPREHENSIVE METABOLIC PANEL
ALT: 9 U/L (ref 0–35)
AST: 13 U/L (ref 0–37)
Calcium: 8.9 mg/dL (ref 8.4–10.5)
Chloride: 103 mEq/L (ref 96–112)
Creatinine, Ser: 0.49 mg/dL (ref 0.40–1.20)
Sodium: 138 mEq/L (ref 135–145)

## 2010-03-06 LAB — CBC WITH DIFFERENTIAL/PLATELET
BASO%: 0.2 % (ref 0.0–2.0)
EOS%: 0.5 % (ref 0.0–7.0)
HCT: 34.7 % — ABNORMAL LOW (ref 34.8–46.6)
MCH: 29.4 pg (ref 25.1–34.0)
MCHC: 33.4 g/dL (ref 31.5–36.0)
NEUT%: 62.2 % (ref 38.4–76.8)
RBC: 3.95 10*6/uL (ref 3.70–5.45)
RDW: 15.6 % — ABNORMAL HIGH (ref 11.2–14.5)
lymph#: 1.7 10*3/uL (ref 0.9–3.3)

## 2010-03-06 NOTE — Letter (Signed)
Summary: [facility]  [facility]   Imported By: Kassie Mends 02/27/2010 08:51:44  _____________________________________________________________________  External Attachment:    Type:   Image     Comment:   External Document

## 2010-03-27 ENCOUNTER — Other Ambulatory Visit: Payer: Self-pay | Admitting: Internal Medicine

## 2010-03-27 ENCOUNTER — Encounter (HOSPITAL_BASED_OUTPATIENT_CLINIC_OR_DEPARTMENT_OTHER): Payer: Medicare Other | Admitting: Internal Medicine

## 2010-03-27 DIAGNOSIS — C3490 Malignant neoplasm of unspecified part of unspecified bronchus or lung: Secondary | ICD-10-CM

## 2010-03-27 DIAGNOSIS — C3410 Malignant neoplasm of upper lobe, unspecified bronchus or lung: Secondary | ICD-10-CM

## 2010-03-27 DIAGNOSIS — C7949 Secondary malignant neoplasm of other parts of nervous system: Secondary | ICD-10-CM

## 2010-03-27 DIAGNOSIS — C7952 Secondary malignant neoplasm of bone marrow: Secondary | ICD-10-CM

## 2010-03-27 DIAGNOSIS — Z5111 Encounter for antineoplastic chemotherapy: Secondary | ICD-10-CM

## 2010-03-27 LAB — CBC WITH DIFFERENTIAL/PLATELET
BASO%: 0.5 % (ref 0.0–2.0)
EOS%: 0.9 % (ref 0.0–7.0)
HCT: 35 % (ref 34.8–46.6)
LYMPH%: 24.7 % (ref 14.0–49.7)
MCH: 29.1 pg (ref 25.1–34.0)
MCHC: 33.7 g/dL (ref 31.5–36.0)
NEUT%: 63.9 % (ref 38.4–76.8)
Platelets: 377 10*3/uL (ref 145–400)
lymph#: 2.1 10*3/uL (ref 0.9–3.3)

## 2010-03-27 LAB — COMPREHENSIVE METABOLIC PANEL
ALT: 10 U/L (ref 0–35)
AST: 15 U/L (ref 0–37)
Creatinine, Ser: 0.5 mg/dL (ref 0.40–1.20)
Total Bilirubin: 0.4 mg/dL (ref 0.3–1.2)

## 2010-03-27 NOTE — Letter (Signed)
Summary: [facility]  [facility]   Imported By: Maryln Gottron 03/23/2010 12:52:42  _____________________________________________________________________  External Attachment:    Type:   Image     Comment:   External Document

## 2010-04-16 ENCOUNTER — Ambulatory Visit (HOSPITAL_COMMUNITY)
Admission: RE | Admit: 2010-04-16 | Discharge: 2010-04-16 | Disposition: A | Discharge status: Home / Self Care | Payer: Medicare Other | Source: Ambulatory Visit | Attending: Internal Medicine | Admitting: Internal Medicine

## 2010-04-16 DIAGNOSIS — M87059 Idiopathic aseptic necrosis of unspecified femur: Secondary | ICD-10-CM | POA: Insufficient documentation

## 2010-04-16 DIAGNOSIS — M899 Disorder of bone, unspecified: Secondary | ICD-10-CM | POA: Insufficient documentation

## 2010-04-16 DIAGNOSIS — C7931 Secondary malignant neoplasm of brain: Secondary | ICD-10-CM | POA: Insufficient documentation

## 2010-04-16 DIAGNOSIS — I708 Atherosclerosis of other arteries: Secondary | ICD-10-CM | POA: Insufficient documentation

## 2010-04-16 DIAGNOSIS — M8448XA Pathological fracture, other site, initial encounter for fracture: Secondary | ICD-10-CM | POA: Insufficient documentation

## 2010-04-16 DIAGNOSIS — C3490 Malignant neoplasm of unspecified part of unspecified bronchus or lung: Secondary | ICD-10-CM

## 2010-04-16 DIAGNOSIS — C7889 Secondary malignant neoplasm of other digestive organs: Secondary | ICD-10-CM | POA: Insufficient documentation

## 2010-04-16 DIAGNOSIS — I700 Atherosclerosis of aorta: Secondary | ICD-10-CM | POA: Insufficient documentation

## 2010-04-16 MED ORDER — IOHEXOL 300 MG/ML  SOLN
100.0000 mL | Freq: Once | INTRAMUSCULAR | Status: AC | PRN
  Administered 2010-04-16: 100 mL via INTRAVENOUS

## 2010-04-18 ENCOUNTER — Other Ambulatory Visit: Payer: Self-pay | Admitting: Internal Medicine

## 2010-04-18 ENCOUNTER — Encounter (HOSPITAL_BASED_OUTPATIENT_CLINIC_OR_DEPARTMENT_OTHER): Payer: Medicare Other | Admitting: Internal Medicine

## 2010-04-18 DIAGNOSIS — C3410 Malignant neoplasm of upper lobe, unspecified bronchus or lung: Secondary | ICD-10-CM

## 2010-04-18 DIAGNOSIS — Z5111 Encounter for antineoplastic chemotherapy: Secondary | ICD-10-CM

## 2010-04-18 DIAGNOSIS — C7949 Secondary malignant neoplasm of other parts of nervous system: Secondary | ICD-10-CM

## 2010-04-18 LAB — COMPREHENSIVE METABOLIC PANEL
Albumin: 4.1 g/dL (ref 3.5–5.2)
CO2: 23 mEq/L (ref 19–32)
Chloride: 105 mEq/L (ref 96–112)
Glucose, Bld: 158 mg/dL — ABNORMAL HIGH (ref 70–99)
Potassium: 3.6 mEq/L (ref 3.5–5.3)
Sodium: 140 mEq/L (ref 135–145)
Total Protein: 6.7 g/dL (ref 6.0–8.3)

## 2010-04-18 LAB — CBC WITH DIFFERENTIAL/PLATELET
Basophils Absolute: 0 10*3/uL (ref 0.0–0.1)
HCT: 40.6 % (ref 34.8–46.6)
HGB: 13 g/dL (ref 11.6–15.9)
MCH: 27.9 pg (ref 25.1–34.0)
MONO#: 0.9 10*3/uL (ref 0.1–0.9)
NEUT%: 60.5 % (ref 38.4–76.8)
lymph#: 2.4 10*3/uL (ref 0.9–3.3)

## 2010-04-23 LAB — CBC
Hemoglobin: 14.5 g/dL (ref 12.0–15.0)
MCHC: 33.3 g/dL (ref 30.0–36.0)
MCV: 87 fL (ref 78.0–100.0)
RDW: 15.6 % — ABNORMAL HIGH (ref 11.5–15.5)

## 2010-04-23 LAB — BASIC METABOLIC PANEL
CO2: 25 mEq/L (ref 19–32)
Calcium: 10 mg/dL (ref 8.4–10.5)
Chloride: 104 mEq/L (ref 96–112)
Creatinine, Ser: 0.47 mg/dL (ref 0.4–1.2)
Glucose, Bld: 190 mg/dL — ABNORMAL HIGH (ref 70–99)
Sodium: 138 mEq/L (ref 135–145)

## 2010-04-23 LAB — DIFFERENTIAL
Basophils Absolute: 0 10*3/uL (ref 0.0–0.1)
Basophils Relative: 0 % (ref 0–1)
Eosinophils Absolute: 0 10*3/uL (ref 0.0–0.7)
Monocytes Absolute: 0.1 10*3/uL (ref 0.1–1.0)
Neutro Abs: 8.6 10*3/uL — ABNORMAL HIGH (ref 1.7–7.7)

## 2010-04-23 LAB — URINALYSIS, ROUTINE W REFLEX MICROSCOPIC
Leukocytes, UA: NEGATIVE
Protein, ur: NEGATIVE mg/dL
Urobilinogen, UA: 0.2 mg/dL (ref 0.0–1.0)

## 2010-04-23 LAB — URINE MICROSCOPIC-ADD ON

## 2010-05-08 ENCOUNTER — Other Ambulatory Visit: Payer: Self-pay | Admitting: Internal Medicine

## 2010-05-08 ENCOUNTER — Encounter (HOSPITAL_BASED_OUTPATIENT_CLINIC_OR_DEPARTMENT_OTHER): Payer: Medicare Other | Admitting: Internal Medicine

## 2010-05-08 DIAGNOSIS — C3410 Malignant neoplasm of upper lobe, unspecified bronchus or lung: Secondary | ICD-10-CM

## 2010-05-08 DIAGNOSIS — G992 Myelopathy in diseases classified elsewhere: Secondary | ICD-10-CM

## 2010-05-08 DIAGNOSIS — C7931 Secondary malignant neoplasm of brain: Secondary | ICD-10-CM

## 2010-05-08 DIAGNOSIS — Z5111 Encounter for antineoplastic chemotherapy: Secondary | ICD-10-CM

## 2010-05-08 DIAGNOSIS — C7952 Secondary malignant neoplasm of bone marrow: Secondary | ICD-10-CM

## 2010-05-08 LAB — CBC WITH DIFFERENTIAL/PLATELET
BASO%: 0.7 % (ref 0.0–2.0)
Basophils Absolute: 0.1 10*3/uL (ref 0.0–0.1)
EOS%: 0.5 % (ref 0.0–7.0)
HGB: 11.2 g/dL — ABNORMAL LOW (ref 11.6–15.9)
MCH: 28.9 pg (ref 25.1–34.0)
MCHC: 33.7 g/dL (ref 31.5–36.0)
MCV: 85.9 fL (ref 79.5–101.0)
MONO%: 11.3 % (ref 0.0–14.0)
RBC: 3.89 10*6/uL (ref 3.70–5.45)
RDW: 14.9 % — ABNORMAL HIGH (ref 11.2–14.5)
lymph#: 1.9 10*3/uL (ref 0.9–3.3)

## 2010-05-08 LAB — COMPREHENSIVE METABOLIC PANEL
ALT: 13 U/L (ref 0–35)
AST: 18 U/L (ref 0–37)
Albumin: 3.9 g/dL (ref 3.5–5.2)
Alkaline Phosphatase: 87 U/L (ref 39–117)
Calcium: 9.1 mg/dL (ref 8.4–10.5)
Chloride: 105 mEq/L (ref 96–112)
Potassium: 4.1 mEq/L (ref 3.5–5.3)
Sodium: 140 mEq/L (ref 135–145)

## 2010-05-09 ENCOUNTER — Other Ambulatory Visit: Payer: Self-pay | Admitting: Family Medicine

## 2010-05-21 ENCOUNTER — Other Ambulatory Visit: Payer: Self-pay | Admitting: Family Medicine

## 2010-05-22 NOTE — Consult Note (Signed)
NAME:  Danielle [name], Danielle [name] NO.:  [id number]   MEDICAL RECORD NO.:  [id number]          PATIENT TYPE:  OUT   LOCATION:  XRAY                         FACILITY:  [facility]   PHYSICIAN:  Sanjeev K. Deveshwar, M.D.DATE OF BIRTH:  07-13-1950   DATE OF CONSULTATION:  07/15/2006  DATE OF DISCHARGE:                                 CONSULTATION   CHIEF COMPLAINT:  Metastatic disease of the spine.   HISTORY OF PRESENT ILLNESS:  This is a very pleasant 60 year old female  referred to Dr. Corliss Skains through the courtesy of Dr. Arbutus Ped.  The  patient has a history of non-small-cell lung cancer.  She is status post  treatment with radiation and chemotherapy.  She had an MRI performed of  the cervical and thoracic spine on July 04, 2006.  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  There was bony involvement particularly on the right side of T12, T3 and  T4, with complete collapse of the T3 level with a kyphotic deformity at  this level.  There was also a narrowing of the ventral subarachnoid  space but no sign of tumor involvement of the canal.  There was no frank  cord compression, no definite other lesions.  There was an abnormal  signal at T6 and T7.  The patient has been referred to Dr. Corliss Skains to  discuss the coblation procedure along with vertebral augmentation.   PAST MEDICAL HISTORY:  Significant for hypertension.  The patient has a  remote history of tobacco use, the above-noted non-small-cell lung  cancer, a history of fibromyalgia, a history of anemia requiring  transfusion.  She has what she describes as borderline diabetes.  She is  on glipizide.   SURGICAL HISTORY:  The patient has had two knee surgery.  She had a  laminectomy and fusion at C5-C6 and C6-C7.  These were performed by Dr.  Danielle Dess.  She reports no previous problems with anesthesia.   ALLERGIES:  She is allergic to SULFA and BACTRIM.   CURRENT MEDICATIONS:   Include morphine, MS Contin, Claritin, Norvasc,  Pepcid, Colace, Restoril, NicoDerm, and glipizide.   SOCIAL HISTORY:  The patient is married.  She has three children, one  child is adopted.  She lives with her husband in [place].  She quit  smoking.  She did smoke two packs per day for at least 30 years.  She  does not use alcohol.  She is on disability.   FAMILY HISTORY:  Her mother died at age 68 from cancer.  Her father is  alive at 78.  He has lymphoma and diabetes.   IMPRESSION AND PLAN:  As noted, the patient presents today for further  evaluation of metastatic disease of the thoracic spine.  She is  accompanied by her sister as well as her father.  Dr. Corliss Skains reviewed  the results of her recent MRI with the patient and her family.  The  kyphoplasty, vertebroplasty and coblation procedures were described in  detail, along with the risks and benefits.  Other treatment options were  also discussed such as continued  limited mobility and treatment with  pain medications.  The patient has been in severe pain which has limited  her activity significantly.   Dr. Corliss Skains was concerned about the T12 fracture.  This level would  present technical difficulties due to the small size of the actual  vertebral body at this height.  He explained to the patient and her  family that he would not know whether this level could be treated until  he was actually in, performing the procedure.  The patient requested  general anesthesia for the intervention and Dr. Corliss Skains agreed with  this plan, especially in light of the number of levels to be treated and  the actual levels involved.  Dr. Corliss Skains did feel that T2 could be  attempted.  He felt he could treat T4.  He also felt T7 should be  treated and biopsied.  Vertebroplasties may need to be performed instead  of the kyphoplasties due to the limited amount of space in which to  work.   The patient's procedure has been tentatively  scheduled for next week in  hopes of stabilizing her fractures and reducing her pain.  It was  explained that the coblation was not meant to be a treatment for her  cancer, merely a way of debulking the tumor.  Again, Dr. Corliss Skains was  not certain that the coblation will be able to be performed due to the  levels involved.   Greater than 40 minutes was spent on this consult.      Delton See, P.A.    ______________________________  Grandville Silos. Corliss Skains, M.D.    DR/MEDQ  D:  07/16/2006  T:  07/16/2006  Job:  [id number]   cc:   Lajuana Matte, MD  Windle Guard, M.D.

## 2010-05-22 NOTE — H&P (Signed)
NAME:  Danielle [name], Danielle [name]             ACCOUNT NO.:  [id number]   MEDICAL RECORD NO.:  [id number]          PATIENT TYPE:  AMB   LOCATION:  SDS                          FACILITY:  [facility]   PHYSICIAN:  Sanjeev K. Deveshwar, M.D.DATE OF BIRTH:  March 16, 1950   DATE OF ADMISSION:  07/23/2006  DATE OF DISCHARGE:  07/23/2006                              HISTORY & PHYSICAL   CHIEF COMPLAINT:  Metastatic disease of the spine.   HISTORY OF PRESENT ILLNESS:  This is a very pleasant 60 year old female  who was referred to Dr. Corliss Skains through the courtesy of Dr. Shirline Frees.  The patient has a history of non-small cell lung cancer.  She is status  post treatment with radiation and chemotherapy.  An MRI was performed of  the cervical and thoracic spine on July 04, 2006.  This revealed a right-  sided superior sulcus tumor which had decreased in size.  There was bony  involvement, particularly on the right side of T12, T3 and T4 with  complete collapse of T4.  There was also narrowing of the ventral  subarachnoid space but no sign of tumor involvement of the canal.  There  was no frank cord compression.  No definite other lesions.  There was  also abnormal signal at T6 and T7.   The patient was seen in consultation by Dr. Corliss Skains on July 15, 2006.  He recommended a kyphoplasty or vertebroplasty with possible Coblation  at T2, T4 and possibly T7.  The patient requested to have this procedure  performed under general anesthesia.  She was admitted to [facility] on July 23, 2006 for the intervention.   PAST MEDICAL HISTORY:  1. 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  2. History of hypertension.  3. Remote history of tobacco use.  4. History of fibromyalgia.  5. History of anemia requiring transfusion.  6. History of diabetes.   SURGICAL HISTORY:  1. The patient has had 2 knee surgeries.  2. She had a laminectomy and fusion at C5-C6,  as well as C6-C7,      performed by Dr. Danielle Dess.  (She reports no previous problems with anesthesia.)   ALLERGIES:  1. SULFA.  2. BACTRIM.   MEDICATIONS:  1. Morphine.  2. MS Contin.  3. Claritin.  4. Norvasc.  5. Pepcid.  6. Colace.  7. Restoril.  8. Nicoderm.  9. Glipizide.   SOCIAL HISTORY:  The patient is married.  She has 3 children; 1 child is  adopted.  She lives with her husband in [place].  She quit smoking.  She had previously smoked 2 packs per day for at least 30 years.  She  does not use alcohol.  She is on disability.   FAMILY HISTORY:  Mother died at age 60 from cancer.  Her father is alive  at age 64.  He has lymphoma and diabetes.   REVIEW OF SYSTEMS:  Completely negative except for a mild occasional  cough which is minimally productive.  She has had continued back pain.  She reports  bruising easily.   LABORATORY DATA:  An INR was 1.0, PTT is 29, hemoglobin was 13.4,  hematocrit 39.5, WBCs 9.6, platelets 444,000, BUN 15, creatinine 0.43,  potassium 4.4, glucose 91, GFR was greater than 60.   PHYSICAL EXAMINATION:  GENERAL:  A pleasant 60 year old white female in  no acute distress.  VITAL SIGNS:  Blood pressure 144/83, pulse 86, respirations 18,  temperature 97.2, oxygen saturation 96%.  HEENT:  Unremarkable.  NECK:  No bruits.  HEART:  Regular rate and rhythm without murmur.  LUNGS:  Clear.  ABDOMEN:  Soft, nontender.  EXTREMITIES:  Pulses intact without edema.  Her airway was rated at a 2.  Her ASA scale was a 3.  NEUROLOGIC:  Mental status - the patient was alert and oriented, follows  commands.  Cranial nerves II-XII grossly intact.  Sensation intact to  light touch.  Motor strength 4/5 throughout.  Cerebellar testing was  intact.   IMPRESSION:  1. 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  2. History of non-small cell lung cancer treated with radiation and      chemotherapy.  3.  Hypertension.  4. Remote tobacco history.  5. History of fibromyalgia.  6. History of anemia.  7. Diabetes mellitus.  8. Status post multiple surgeries.  9. Allergy to sulfa and Bactrim.   PLAN:  As noted, the patient presents today to undergo possible  kyphoplasties or vertebroplasties with possible Coblation at multiple  levels including T2, T3, T4 and T7.  This will be performed under  general anesthesia per the patient's request.      Delton See, P.A.    ______________________________  Grandville Silos. Corliss Skains, M.D.    DR/MEDQ  D:  07/23/2006  T:  07/24/2006  Job:  [id number]   cc:   Lajuana Matte, MD  Windle Guard, M.D.

## 2010-05-25 NOTE — Discharge Summary (Signed)
NAME:  Danielle [name], Danielle [name] NO.:  [id number]   MEDICAL RECORD NO.:  [id number]          PATIENT TYPE:  INP   LOCATION:  1303                         FACILITY:  [facility]   PHYSICIAN:  Lajuana Matte, MD  DATE OF BIRTH:  06/14/1950   DATE OF ADMISSION:  01/20/2006  DATE OF DISCHARGE:  01/30/2006                               DISCHARGE SUMMARY   REASON FOR ADMISSION:  1. Right upper lobe pneumonia.  2. Locally advanced lung cancer.  3. Dyspnea.   HISTORY:  Danielle [name] is a 60 year old white female with a history of  stage III non-small cell lung cancer initially presenting as a right  Pancoast tumor with vertebral body/canal invasion and spinal cord  depression in October 2007.  She was treated with recurrent  chemoradiation therapy with weekly carboplatin for an AUC of 2 and  paclitaxel on a weekly basis, last given November 18, 2005, followed by  consolidation chemotherapy with a dose of Taxol given every 3 weeks; the  first cycle given on December 12, 2005 with the Neulasta support.  She  did not receive a second cycle due to an interim admission to the [facility] for pain control, elevated glucose levels, and  hyperkalemia.  It was further complicated with septic emboli and  progressive lymphadenopathy which was reactive, although there was a  question of disease progression.  During that hospitalization, she  remained in the hospital until December 30, 2005, was discharged on IV  Decadron and IV antibiotics, consisting of cefepime.  Fine-needle  aspiration biopsy on January 08, 2006, of the new lymphadenopathy was  negative for metastasis but was consistent with Nocardia infection .  She was to have been seen by infectious disease but the appointment  process was delayed.  She presented on the day of admission with a 24-  hour history of shortness of breath and green sputum production.  A  chest x-ray was taken in the emergency room showing progression of  the  bilateral pulmonary nodules with a possible superimposed right upper  lobe pneumonia.   HOSPITAL COURSE:  The patient was admitted to the oncology service for  further management of her presenting symptoms.  She was placed on  Rocephin initially and oxygen.  We did obtain an infectious disease  consult and their recommendations were to change her antibiotics from  the Rocephin to Zosyn, which was done.  She continued on the Zosyn but  also continued to have some low-grade fevers.  Cultures that were  obtained of the abscess during the biopsy procedure now revealed  Nocardia species and infectious disease recommended changing the  antibiotics from the Zosyn to Bactrim.  The patient was treated with  BACTRIM IV; HOWEVER, DEVELOPED RASH AND ITCHING AND THIS WAS FELT TO BE  AN ALLERGIC REACTION TO THE SULFA DRUG.  The Bactrim was discontinued  and she was placed on Primaxin 500 mg IV q. 6 hours.  She gradually  defervesced.  She did continue to have dyspnea with exertion with  desaturation of her O2 sat to 80% on room air with minimal exertion.  She also had some anemia during the admission with a hemoglobin of 8.4  and hematocrit of 25.1.  She was transfused 2 units of packed red blood  cells with resolution of the anemia.  For GI prophylaxis, she was  treated with Pepcid 20 mg p.o. b.i.d.  For DVT prophylaxis, she was  treated with Lovenox 30 mg subcutaneously daily.  For pain management,  she maintains MS Contin and MSIR.  For the dyspnea, she was also treated  with Xopenex and Atrovent nebulizers q. 6 hours.   DISCHARGE CONDITION:  Good.   DISCHARGE DIET:  No restrictions.   DISCHARGE ACTIVITY:  Increase activity slowly.   DISCHARGE MEDICATIONS:  Per infectious disease recommendations, she will  be sent home on:  1. IV antibiotic therapy with Primaxin 1 g IV q. 12 hours for an      additional 10 days.  A PICC line will be placed prior to discharge      to aid in prophylaxis  for the IV antibiotics.  2. She will continue on Xopenex and Atrovent nebulizers q. 6 h.  3. Colace 100 mg p.o. once daily.  4. Pepcid 20 mg p.o. b.i.d.  5. MS Contin 60 mg p.o. q. 12 h.  6. MSIR 15 mg p.o. q. 4 hour p.r.n. breakthrough pain.  7. Restoril 30 mg p.o. nightly p.r.n. insomnia.  8. Compazine 10 mg p.o. q. 6 hours p.r.n. nausea.  9. Home oxygen at 2-3 L via nasal cannula in order to keep the O2      saturation greater than or equal to 92%.   DISCHARGE FOLLOWUP:  She is to follow up with Dr. Maurice March on February 07, 2006, and will follow up with Dr. Arbutus Ped on February 19, 2006.      Danielle Loft, PA.      Lajuana Matte, MD  Electronically Signed    AJ/MEDQ  D:  01/30/2006  T:  01/30/2006  Job:  [id number]

## 2010-06-05 ENCOUNTER — Other Ambulatory Visit: Payer: Self-pay | Admitting: Internal Medicine

## 2010-06-05 ENCOUNTER — Encounter (HOSPITAL_BASED_OUTPATIENT_CLINIC_OR_DEPARTMENT_OTHER): Payer: Medicare Other | Admitting: Internal Medicine

## 2010-06-05 ENCOUNTER — Other Ambulatory Visit: Payer: Self-pay | Admitting: Physician Assistant

## 2010-06-05 DIAGNOSIS — Z5111 Encounter for antineoplastic chemotherapy: Secondary | ICD-10-CM

## 2010-06-05 DIAGNOSIS — C3410 Malignant neoplasm of upper lobe, unspecified bronchus or lung: Secondary | ICD-10-CM

## 2010-06-05 DIAGNOSIS — C3490 Malignant neoplasm of unspecified part of unspecified bronchus or lung: Secondary | ICD-10-CM

## 2010-06-05 DIAGNOSIS — C7931 Secondary malignant neoplasm of brain: Secondary | ICD-10-CM

## 2010-06-05 LAB — COMPREHENSIVE METABOLIC PANEL
AST: 17 U/L (ref 0–37)
Alkaline Phosphatase: 76 U/L (ref 39–117)
BUN: 14 mg/dL (ref 6–23)
Glucose, Bld: 90 mg/dL (ref 70–99)
Potassium: 3.9 mEq/L (ref 3.5–5.3)
Sodium: 139 mEq/L (ref 135–145)
Total Bilirubin: 0.3 mg/dL (ref 0.3–1.2)

## 2010-06-05 LAB — CBC WITH DIFFERENTIAL/PLATELET
BASO%: 0.4 % (ref 0.0–2.0)
Basophils Absolute: 0 10*3/uL (ref 0.0–0.1)
EOS%: 1 % (ref 0.0–7.0)
HGB: 10.9 g/dL — ABNORMAL LOW (ref 11.6–15.9)
MCH: 27.7 pg (ref 25.1–34.0)
MCHC: 32.2 g/dL (ref 31.5–36.0)
MCV: 86.3 fL (ref 79.5–101.0)
MONO%: 12.4 % (ref 0.0–14.0)
NEUT%: 53.1 % (ref 38.4–76.8)
RDW: 15 % — ABNORMAL HIGH (ref 11.2–14.5)
lymph#: 2.2 10*3/uL (ref 0.9–3.3)

## 2010-06-22 ENCOUNTER — Encounter (HOSPITAL_COMMUNITY): Payer: Self-pay

## 2010-06-22 ENCOUNTER — Ambulatory Visit (HOSPITAL_COMMUNITY)
Admission: RE | Admit: 2010-06-22 | Discharge: 2010-06-22 | Disposition: A | Discharge status: Home / Self Care | Payer: Medicare Other | Source: Ambulatory Visit | Attending: Internal Medicine | Admitting: Internal Medicine

## 2010-06-22 DIAGNOSIS — I700 Atherosclerosis of aorta: Secondary | ICD-10-CM | POA: Insufficient documentation

## 2010-06-22 DIAGNOSIS — C7951 Secondary malignant neoplasm of bone: Secondary | ICD-10-CM | POA: Insufficient documentation

## 2010-06-22 DIAGNOSIS — I2510 Atherosclerotic heart disease of native coronary artery without angina pectoris: Secondary | ICD-10-CM | POA: Insufficient documentation

## 2010-06-22 DIAGNOSIS — C3490 Malignant neoplasm of unspecified part of unspecified bronchus or lung: Secondary | ICD-10-CM | POA: Insufficient documentation

## 2010-06-22 DIAGNOSIS — C7931 Secondary malignant neoplasm of brain: Secondary | ICD-10-CM | POA: Insufficient documentation

## 2010-06-22 HISTORY — DX: Essential (primary) hypertension: I10

## 2010-06-22 MED ORDER — IOHEXOL 300 MG/ML  SOLN
100.0000 mL | Freq: Once | INTRAMUSCULAR | Status: AC | PRN
  Administered 2010-06-22: 100 mL via INTRAVENOUS

## 2010-06-26 ENCOUNTER — Encounter (HOSPITAL_BASED_OUTPATIENT_CLINIC_OR_DEPARTMENT_OTHER): Payer: Medicare Other | Admitting: Internal Medicine

## 2010-06-26 ENCOUNTER — Other Ambulatory Visit: Payer: Self-pay | Admitting: Internal Medicine

## 2010-06-26 DIAGNOSIS — Z5111 Encounter for antineoplastic chemotherapy: Secondary | ICD-10-CM

## 2010-06-26 DIAGNOSIS — C3410 Malignant neoplasm of upper lobe, unspecified bronchus or lung: Secondary | ICD-10-CM

## 2010-06-26 DIAGNOSIS — C7931 Secondary malignant neoplasm of brain: Secondary | ICD-10-CM

## 2010-06-26 DIAGNOSIS — C7951 Secondary malignant neoplasm of bone: Secondary | ICD-10-CM

## 2010-06-26 LAB — CBC WITH DIFFERENTIAL/PLATELET
Basophils Absolute: 0 10*3/uL (ref 0.0–0.1)
EOS%: 0.6 % (ref 0.0–7.0)
Eosinophils Absolute: 0.1 10*3/uL (ref 0.0–0.5)
HGB: 12.2 g/dL (ref 11.6–15.9)
LYMPH%: 27.4 % (ref 14.0–49.7)
MCH: 27.7 pg (ref 25.1–34.0)
MCV: 85.9 fL (ref 79.5–101.0)
MONO%: 12.3 % (ref 0.0–14.0)
NEUT#: 4.8 10*3/uL (ref 1.5–6.5)
NEUT%: 59.4 % (ref 38.4–76.8)
Platelets: 354 10*3/uL (ref 145–400)

## 2010-06-26 LAB — COMPREHENSIVE METABOLIC PANEL
BUN: 9 mg/dL (ref 6–23)
CO2: 23 mEq/L (ref 19–32)
Creatinine, Ser: 0.8 mg/dL (ref 0.50–1.10)
Glucose, Bld: 105 mg/dL — ABNORMAL HIGH (ref 70–99)
Total Bilirubin: 0.4 mg/dL (ref 0.3–1.2)
Total Protein: 6.5 g/dL (ref 6.0–8.3)

## 2010-07-09 ENCOUNTER — Other Ambulatory Visit (INDEPENDENT_AMBULATORY_CARE_PROVIDER_SITE_OTHER): Payer: Medicare Other

## 2010-07-09 DIAGNOSIS — E7800 Pure hypercholesterolemia, unspecified: Secondary | ICD-10-CM

## 2010-07-09 DIAGNOSIS — E119 Type 2 diabetes mellitus without complications: Secondary | ICD-10-CM

## 2010-07-09 LAB — BASIC METABOLIC PANEL
CO2: 28 mEq/L (ref 19–32)
Chloride: 108 mEq/L (ref 96–112)
GFR: 105.1 mL/min (ref >60.00)
Glucose, Bld: 98 mg/dL (ref 70–99)
Potassium: 3.8 mEq/L (ref 3.5–5.1)
Sodium: 141 mEq/L (ref 135–145)

## 2010-07-09 LAB — HEPATIC FUNCTION PANEL
ALT: 18 U/L (ref 0–35)
AST: 20 U/L (ref 0–37)
Albumin: 3.7 g/dL (ref 3.5–5.2)
Alkaline Phosphatase: 82 U/L (ref 39–117)

## 2010-07-09 LAB — LIPID PANEL
LDL Cholesterol: 67 mg/dL (ref 0–99)
VLDL: 17.2 mg/dL (ref 0.0–40.0)

## 2010-07-09 LAB — HEMOGLOBIN A1C: Hgb A1c MFr Bld: 6.4 % (ref 4.6–6.5)

## 2010-07-11 ENCOUNTER — Encounter: Payer: Self-pay | Admitting: Family Medicine

## 2010-07-16 ENCOUNTER — Encounter: Payer: Self-pay | Admitting: Family Medicine

## 2010-07-16 ENCOUNTER — Ambulatory Visit (INDEPENDENT_AMBULATORY_CARE_PROVIDER_SITE_OTHER): Payer: Medicare Other | Admitting: Family Medicine

## 2010-07-16 DIAGNOSIS — E7800 Pure hypercholesterolemia, unspecified: Secondary | ICD-10-CM

## 2010-07-16 DIAGNOSIS — E119 Type 2 diabetes mellitus without complications: Secondary | ICD-10-CM

## 2010-07-16 DIAGNOSIS — I10 Essential (primary) hypertension: Secondary | ICD-10-CM

## 2010-07-16 NOTE — Assessment & Plan Note (Signed)
Well controlled. Continue current medication.  

## 2010-07-16 NOTE — Progress Notes (Signed)
  Subjective:    Patient ID: Danielle [name], female    DOB: [birth year], 60 y.o.   MRN: [id number]  HPI 60 year old female with: Lung cancer, metastatic On 51st chemo cycle!!!.. MRI brain..with changes but was thought Not to represent tumor recurrence at this time. Sees ONC regularly. Chemo recieves every 3 weeks.  CT scan of lungs and abdomen/pelvis stable at last check.  Pain well controlled with morphine..some constipation..hard stool.  Sleeping well. Using alprazolam.   Diabetes: Well controlled with diet Using medications without difficulties: Hypoglycemic episodes: None Hyperglycemic episodes:None Feet problems: None Blood Sugars averaging: not checking at home. eye exam within last year: No   Hypertension:  Well controlled on amlodipine and lisinopril  Using medication without problems or lightheadedness:  Chest pain with exertion:None Edema:Occ Short of breath: Yes, but stable Average home BPs: At goal. Other issues:  Elevated Cholesterol: On zocor. Using medications without problems:Yes Muscle aches: None Other complaints:    Review of Systems  Constitutional: Positive for fatigue. Negative for fever.  HENT: Negative for congestion.   Eyes: Negative for visual disturbance.  Respiratory: Positive for cough, shortness of breath and wheezing.   Cardiovascular: Negative for chest pain, palpitations and leg swelling.  Gastrointestinal: Negative for abdominal pain, diarrhea and constipation.  Genitourinary: Negative for dysuria.  Musculoskeletal: Positive for back pain.       Objective:   Physical Exam  Constitutional: Vital signs are normal. She appears well-developed and well-nourished. She is cooperative.  Non-toxic appearance. She does not appear ill. No distress.  HENT:  Head: Normocephalic.  Right Ear: Hearing, tympanic membrane, external ear and ear canal normal. Tympanic membrane is not erythematous, not retracted and not bulging.  Left Ear: Hearing,  tympanic membrane, external ear and ear canal normal. Tympanic membrane is not erythematous, not retracted and not bulging.  Nose: No mucosal edema or rhinorrhea. Right sinus exhibits no maxillary sinus tenderness and no frontal sinus tenderness. Left sinus exhibits no maxillary sinus tenderness and no frontal sinus tenderness.  Mouth/Throat: Uvula is midline, oropharynx is clear and moist and mucous membranes are normal.  Eyes: Conjunctivae, EOM and lids are normal. Pupils are equal, round, and reactive to light. No foreign bodies found.  Neck: Trachea normal and normal range of motion. Neck supple. Carotid bruit is not present. No mass and no thyromegaly present.  Cardiovascular: Normal rate, regular rhythm, S1 normal, S2 normal, normal heart sounds, intact distal pulses and normal pulses.  Exam reveals no gallop and no friction rub.   No murmur heard. Pulmonary/Chest: Effort normal. Not tachypneic. No respiratory distress. She has decreased breath sounds in the right upper field and the right middle field. She has no wheezes. She has no rhonchi. She has no rales.       Lung exam stable from previous Moist cough intermittantly  Abdominal: Soft. Normal appearance and bowel sounds are normal. There is no tenderness.  Neurological: She is alert.  Skin: Skin is warm, dry and intact. No rash noted.  Psychiatric: Her speech is normal and behavior is normal. Judgment and thought content normal. Her mood appears not anxious. Cognition and memory are normal. She does not exhibit a depressed mood.          Assessment & Plan:

## 2010-07-16 NOTE — Patient Instructions (Addendum)
Don't forget yearly eye exam. Try to exercise as tolerated, consider water exercise.  Work on [employer] like almonds, olive oil, avacados in diet.

## 2010-07-17 ENCOUNTER — Other Ambulatory Visit: Payer: Self-pay | Admitting: Internal Medicine

## 2010-07-17 ENCOUNTER — Encounter (HOSPITAL_BASED_OUTPATIENT_CLINIC_OR_DEPARTMENT_OTHER): Payer: Medicare Other | Admitting: Internal Medicine

## 2010-07-17 DIAGNOSIS — B370 Candidal stomatitis: Secondary | ICD-10-CM

## 2010-07-17 DIAGNOSIS — C7951 Secondary malignant neoplasm of bone: Secondary | ICD-10-CM

## 2010-07-17 DIAGNOSIS — C7931 Secondary malignant neoplasm of brain: Secondary | ICD-10-CM

## 2010-07-17 DIAGNOSIS — C3410 Malignant neoplasm of upper lobe, unspecified bronchus or lung: Secondary | ICD-10-CM

## 2010-07-17 DIAGNOSIS — Z5111 Encounter for antineoplastic chemotherapy: Secondary | ICD-10-CM

## 2010-07-17 LAB — CBC WITH DIFFERENTIAL/PLATELET
Basophils Absolute: 0 10*3/uL (ref 0.0–0.1)
EOS%: 0.6 % (ref 0.0–7.0)
LYMPH%: 32.4 % (ref 14.0–49.7)
MCH: 28.4 pg (ref 25.1–34.0)
MCV: 86.3 fL (ref 79.5–101.0)
MONO%: 15.2 % — ABNORMAL HIGH (ref 0.0–14.0)
RBC: 4.22 10*6/uL (ref 3.70–5.45)
RDW: 15 % — ABNORMAL HIGH (ref 11.2–14.5)
nRBC: 0 % (ref 0–0)

## 2010-08-07 ENCOUNTER — Other Ambulatory Visit: Payer: Self-pay | Admitting: Internal Medicine

## 2010-08-07 ENCOUNTER — Encounter (HOSPITAL_BASED_OUTPATIENT_CLINIC_OR_DEPARTMENT_OTHER): Payer: Medicare Other | Admitting: Internal Medicine

## 2010-08-07 DIAGNOSIS — H6690 Otitis media, unspecified, unspecified ear: Secondary | ICD-10-CM

## 2010-08-07 DIAGNOSIS — C7949 Secondary malignant neoplasm of other parts of nervous system: Secondary | ICD-10-CM

## 2010-08-07 DIAGNOSIS — C3490 Malignant neoplasm of unspecified part of unspecified bronchus or lung: Secondary | ICD-10-CM

## 2010-08-07 DIAGNOSIS — C7952 Secondary malignant neoplasm of bone marrow: Secondary | ICD-10-CM

## 2010-08-07 DIAGNOSIS — B370 Candidal stomatitis: Secondary | ICD-10-CM

## 2010-08-07 DIAGNOSIS — C3410 Malignant neoplasm of upper lobe, unspecified bronchus or lung: Secondary | ICD-10-CM

## 2010-08-07 DIAGNOSIS — Z5111 Encounter for antineoplastic chemotherapy: Secondary | ICD-10-CM

## 2010-08-07 LAB — CBC WITH DIFFERENTIAL/PLATELET
BASO%: 0.2 % (ref 0.0–2.0)
EOS%: 0.5 % (ref 0.0–7.0)
LYMPH%: 28 % (ref 14.0–49.7)
MCH: 28.5 pg (ref 25.1–34.0)
MCHC: 32.9 g/dL (ref 31.5–36.0)
MONO#: 1.2 10*3/uL — ABNORMAL HIGH (ref 0.1–0.9)
Platelets: 288 10*3/uL (ref 145–400)
RBC: 4.32 10*6/uL (ref 3.70–5.45)
WBC: 8.6 10*3/uL (ref 3.9–10.3)
nRBC: 0 % (ref 0–0)

## 2010-08-07 LAB — COMPREHENSIVE METABOLIC PANEL
ALT: 15 U/L (ref 0–35)
AST: 19 U/L (ref 0–37)
Alkaline Phosphatase: 89 U/L (ref 39–117)
BUN: 13 mg/dL (ref 6–23)
Chloride: 101 mEq/L (ref 96–112)
Creatinine, Ser: <0.47 mg/dL — ABNORMAL LOW (ref 0.50–1.10)
Total Bilirubin: 0.3 mg/dL (ref 0.3–1.2)

## 2010-08-15 ENCOUNTER — Other Ambulatory Visit: Payer: Self-pay | Admitting: Family Medicine

## 2010-08-21 ENCOUNTER — Encounter: Payer: Self-pay | Admitting: Family Medicine

## 2010-08-21 ENCOUNTER — Ambulatory Visit (INDEPENDENT_AMBULATORY_CARE_PROVIDER_SITE_OTHER): Payer: Medicare Other | Admitting: Family Medicine

## 2010-08-21 VITALS — BP 128/74 | HR 104 | Temp 97.7°F | Wt 157.8 lb

## 2010-08-21 DIAGNOSIS — H9390 Unspecified disorder of ear, unspecified ear: Secondary | ICD-10-CM

## 2010-08-21 DIAGNOSIS — H9391 Unspecified disorder of right ear: Secondary | ICD-10-CM | POA: Insufficient documentation

## 2010-08-21 NOTE — Progress Notes (Signed)
  Subjective:    Patient ID: Danielle [name], female    DOB: 08-Aug-1950, 60 y.o.   MRN: [id number]  HPI CC: R ear infection?  Started when had chemo 2 wks ago, given 7d amoxicillin.  States didn't knock this out.  Initially felt better on abx but never fully cleared muffled hearing.  Feels like has "ocean in ear".  Mild ringing in ear.  No pain, feels stopped up.  Initially pain in ear.  No current fevers.  No abd pain, nausea, vomiting.  No swimming recently.  No draining from ear.  Using auralgan helps some.  On chemo for lung cancer.  Has had ear infections in past on chemo.  Review of Systems Per HPI    Objective:   Physical Exam  Nursing note and vitals reviewed. Constitutional: She appears well-developed and well-nourished. No distress.  HENT:  Head: Normocephalic and atraumatic.  Right Ear: Hearing and external ear normal. Tympanic membrane is not perforated.  Left Ear: Hearing, tympanic membrane, external ear and ear canal normal.  Nose: Nose normal. No mucosal edema or rhinorrhea. Right sinus exhibits no maxillary sinus tenderness and no frontal sinus tenderness. Left sinus exhibits no maxillary sinus tenderness and no frontal sinus tenderness.  Mouth/Throat: Uvula is midline and mucous membranes are normal. Posterior oropharyngeal erythema (soft palate telangiectasias) present. No oropharyngeal exudate, posterior oropharyngeal edema or tonsillar abscesses.       R ear: No pain with palp of tragus, pinna, or mastoid Slight collection of wax superficial canal, removed with ear curette. TM slightly erythematous, fluid behind TM.  Good light reflex, mobile.  Eyes: Conjunctivae and EOM are normal. Pupils are equal, round, and reactive to light. No scleral icterus.  Neck: Normal range of motion. Neck supple.  Cardiovascular: Normal rate, regular rhythm, normal heart sounds and intact distal pulses.   No murmur heard. Pulmonary/Chest: Effort normal and breath sounds normal. No  respiratory distress. She has no wheezes. She has no rales.  Lymphadenopathy:    She has no cervical adenopathy.  Skin: Skin is warm and dry. No rash noted.  Psychiatric: She has a normal mood and affect.          Assessment & Plan:

## 2010-08-21 NOTE — Patient Instructions (Signed)
I think you have continued congestion behind eardrum, but no evidence of continued infection. Nasal saline irrigation for next week. Oral steroids coming up for scan may help. Continue pushing fluids, continue tylenol. Notify us if pain returning, or worsening sinus congestion/pressure.

## 2010-08-21 NOTE — Assessment & Plan Note (Signed)
No evidence of infection currently, no fever/pain. Anticipate ear congestion/ETD after recent otitis media causing sxs.   Treat with nasal saline, continued auralgan and tylenol as needed. Considered INS however pt to start oral steroids this Friday for upcoming CT scan.  Anticipate will help some then. Advised notify us if pain returning or other concerns.

## 2010-08-24 ENCOUNTER — Ambulatory Visit (HOSPITAL_COMMUNITY)
Admission: RE | Admit: 2010-08-24 | Discharge: 2010-08-24 | Disposition: A | Discharge status: Home / Self Care | Payer: Medicare Other | Source: Ambulatory Visit | Attending: Internal Medicine | Admitting: Internal Medicine

## 2010-08-24 ENCOUNTER — Encounter (HOSPITAL_COMMUNITY): Payer: Self-pay

## 2010-08-24 DIAGNOSIS — M47817 Spondylosis without myelopathy or radiculopathy, lumbosacral region: Secondary | ICD-10-CM | POA: Insufficient documentation

## 2010-08-24 DIAGNOSIS — C3490 Malignant neoplasm of unspecified part of unspecified bronchus or lung: Secondary | ICD-10-CM | POA: Insufficient documentation

## 2010-08-24 DIAGNOSIS — I700 Atherosclerosis of aorta: Secondary | ICD-10-CM | POA: Insufficient documentation

## 2010-08-24 MED ORDER — IOHEXOL 300 MG/ML  SOLN
100.0000 mL | Freq: Once | INTRAMUSCULAR | Status: AC | PRN
  Administered 2010-08-24: 100 mL via INTRAVENOUS

## 2010-08-28 ENCOUNTER — Encounter (HOSPITAL_BASED_OUTPATIENT_CLINIC_OR_DEPARTMENT_OTHER): Payer: Medicare Other | Admitting: Internal Medicine

## 2010-08-28 ENCOUNTER — Other Ambulatory Visit: Payer: Self-pay | Admitting: Internal Medicine

## 2010-08-28 DIAGNOSIS — H9209 Otalgia, unspecified ear: Secondary | ICD-10-CM

## 2010-08-28 DIAGNOSIS — C3410 Malignant neoplasm of upper lobe, unspecified bronchus or lung: Secondary | ICD-10-CM

## 2010-08-28 DIAGNOSIS — C7951 Secondary malignant neoplasm of bone: Secondary | ICD-10-CM

## 2010-08-28 DIAGNOSIS — C7949 Secondary malignant neoplasm of other parts of nervous system: Secondary | ICD-10-CM

## 2010-08-28 DIAGNOSIS — Z5111 Encounter for antineoplastic chemotherapy: Secondary | ICD-10-CM

## 2010-08-28 LAB — CBC WITH DIFFERENTIAL/PLATELET
Basophils Absolute: 0.2 10*3/uL — ABNORMAL HIGH (ref 0.0–0.1)
Eosinophils Absolute: 0 10*3/uL (ref 0.0–0.5)
HCT: 34.6 % — ABNORMAL LOW (ref 34.8–46.6)
HGB: 11.8 g/dL (ref 11.6–15.9)
LYMPH%: 25.6 % (ref 14.0–49.7)
MCV: 85.8 fL (ref 79.5–101.0)
MONO%: 12.3 % (ref 0.0–14.0)
NEUT#: 5.4 10*3/uL (ref 1.5–6.5)
NEUT%: 59.5 % (ref 38.4–76.8)
Platelets: 554 10*3/uL — ABNORMAL HIGH (ref 145–400)
RBC: 4.03 10*6/uL (ref 3.70–5.45)

## 2010-08-28 LAB — COMPREHENSIVE METABOLIC PANEL
Alkaline Phosphatase: 74 U/L (ref 39–117)
BUN: 14 mg/dL (ref 6–23)
CO2: 23 mEq/L (ref 19–32)
Creatinine, Ser: 0.56 mg/dL (ref 0.50–1.10)
Glucose, Bld: 89 mg/dL (ref 70–99)
Total Bilirubin: 0.2 mg/dL — ABNORMAL LOW (ref 0.3–1.2)

## 2010-09-15 ENCOUNTER — Other Ambulatory Visit: Payer: Self-pay | Admitting: Family Medicine

## 2010-09-18 ENCOUNTER — Other Ambulatory Visit: Payer: Self-pay | Admitting: Internal Medicine

## 2010-09-18 ENCOUNTER — Encounter (HOSPITAL_BASED_OUTPATIENT_CLINIC_OR_DEPARTMENT_OTHER): Payer: Medicare Other | Admitting: Internal Medicine

## 2010-09-18 DIAGNOSIS — C7949 Secondary malignant neoplasm of other parts of nervous system: Secondary | ICD-10-CM

## 2010-09-18 DIAGNOSIS — C3410 Malignant neoplasm of upper lobe, unspecified bronchus or lung: Secondary | ICD-10-CM

## 2010-09-18 DIAGNOSIS — C7951 Secondary malignant neoplasm of bone: Secondary | ICD-10-CM

## 2010-09-18 DIAGNOSIS — C7931 Secondary malignant neoplasm of brain: Secondary | ICD-10-CM

## 2010-09-18 DIAGNOSIS — Z5111 Encounter for antineoplastic chemotherapy: Secondary | ICD-10-CM

## 2010-09-18 LAB — CBC WITH DIFFERENTIAL/PLATELET
BASO%: 0.5 % (ref 0.0–2.0)
Eosinophils Absolute: 0.1 10*3/uL (ref 0.0–0.5)
MCHC: 34 g/dL (ref 31.5–36.0)
MCV: 86.8 fL (ref 79.5–101.0)
MONO%: 13 % (ref 0.0–14.0)
NEUT#: 4.5 10*3/uL (ref 1.5–6.5)
RBC: 4.1 10*6/uL (ref 3.70–5.45)
RDW: 16.8 % — ABNORMAL HIGH (ref 11.2–14.5)
WBC: 7.7 10*3/uL (ref 3.9–10.3)

## 2010-09-18 LAB — COMPREHENSIVE METABOLIC PANEL
ALT: 11 U/L (ref 0–35)
AST: 18 U/L (ref 0–37)
Albumin: 3.9 g/dL (ref 3.5–5.2)
Alkaline Phosphatase: 81 U/L (ref 39–117)
Glucose, Bld: 83 mg/dL (ref 70–99)
Potassium: 3.7 mEq/L (ref 3.5–5.3)
Sodium: 141 mEq/L (ref 135–145)
Total Protein: 6.2 g/dL (ref 6.0–8.3)

## 2010-09-28 LAB — CBC
MCHC: 34.7
MCV: 80.2
Platelets: 460 — ABNORMAL HIGH
RBC: 4.63
RDW: 13.1

## 2010-10-09 ENCOUNTER — Other Ambulatory Visit: Payer: Self-pay | Admitting: Internal Medicine

## 2010-10-09 ENCOUNTER — Encounter (HOSPITAL_BASED_OUTPATIENT_CLINIC_OR_DEPARTMENT_OTHER): Payer: Medicare Other | Admitting: Internal Medicine

## 2010-10-09 DIAGNOSIS — Z5111 Encounter for antineoplastic chemotherapy: Secondary | ICD-10-CM

## 2010-10-09 DIAGNOSIS — Z23 Encounter for immunization: Secondary | ICD-10-CM

## 2010-10-09 DIAGNOSIS — C3410 Malignant neoplasm of upper lobe, unspecified bronchus or lung: Secondary | ICD-10-CM

## 2010-10-09 DIAGNOSIS — C7951 Secondary malignant neoplasm of bone: Secondary | ICD-10-CM

## 2010-10-09 DIAGNOSIS — C7931 Secondary malignant neoplasm of brain: Secondary | ICD-10-CM

## 2010-10-09 DIAGNOSIS — C3490 Malignant neoplasm of unspecified part of unspecified bronchus or lung: Secondary | ICD-10-CM

## 2010-10-09 LAB — COMPREHENSIVE METABOLIC PANEL
ALT: 12 U/L (ref 0–35)
CO2: 22 mEq/L (ref 19–32)
Sodium: 139 mEq/L (ref 135–145)
Total Bilirubin: 0.5 mg/dL (ref 0.3–1.2)
Total Protein: 6.5 g/dL (ref 6.0–8.3)

## 2010-10-09 LAB — CBC WITH DIFFERENTIAL/PLATELET
BASO%: 0.4 % (ref 0.0–2.0)
Basophils Absolute: 0 10e3/uL (ref 0.0–0.1)
EOS%: 0.6 % (ref 0.0–7.0)
Eosinophils Absolute: 0.1 10e3/uL (ref 0.0–0.5)
HCT: 35.8 % (ref 34.8–46.6)
HGB: 11.8 g/dL (ref 11.6–15.9)
LYMPH%: 30.6 % (ref 14.0–49.7)
MCH: 28.4 pg (ref 25.1–34.0)
MCHC: 33 g/dL (ref 31.5–36.0)
MCV: 86.3 fL (ref 79.5–101.0)
MONO#: 0.9 10e3/uL (ref 0.1–0.9)
MONO%: 11.8 % (ref 0.0–14.0)
NEUT#: 4.5 10e3/uL (ref 1.5–6.5)
NEUT%: 56.6 % (ref 38.4–76.8)
Platelets: 357 10e3/uL (ref 145–400)
RBC: 4.15 10e6/uL (ref 3.70–5.45)
RDW: 15.2 % — ABNORMAL HIGH (ref 11.2–14.5)
WBC: 7.9 10e3/uL (ref 3.9–10.3)
lymph#: 2.4 10e3/uL (ref 0.9–3.3)
nRBC: 0 % (ref 0–0)

## 2010-10-22 DIAGNOSIS — C3490 Malignant neoplasm of unspecified part of unspecified bronchus or lung: Secondary | ICD-10-CM | POA: Insufficient documentation

## 2010-10-22 DIAGNOSIS — Z87898 Personal history of other specified conditions: Secondary | ICD-10-CM | POA: Insufficient documentation

## 2010-10-22 LAB — DIFFERENTIAL
Basophils Absolute: 0
Basophils Relative: 0
Neutro Abs: 6.5
Neutrophils Relative %: 68

## 2010-10-22 LAB — CBC
MCHC: 33.8
Platelets: 444 — ABNORMAL HIGH
RBC: 4.9
RDW: 16.5 — ABNORMAL HIGH

## 2010-10-22 LAB — BASIC METABOLIC PANEL
BUN: 15
CO2: 27
Calcium: 10.4
Creatinine, Ser: 0.43
GFR calc Af Amer: >60
Glucose, Bld: 91

## 2010-10-22 LAB — APTT: aPTT: 29

## 2010-10-22 LAB — PROTIME-INR
INR: 1
Prothrombin Time: 13

## 2010-10-26 ENCOUNTER — Ambulatory Visit (HOSPITAL_COMMUNITY)
Admission: RE | Admit: 2010-10-26 | Discharge: 2010-10-26 | Disposition: A | Discharge status: Home / Self Care | Payer: Medicare Other | Source: Ambulatory Visit | Attending: Internal Medicine | Admitting: Internal Medicine

## 2010-10-26 ENCOUNTER — Encounter (HOSPITAL_COMMUNITY): Payer: Self-pay

## 2010-10-26 DIAGNOSIS — C3490 Malignant neoplasm of unspecified part of unspecified bronchus or lung: Secondary | ICD-10-CM | POA: Insufficient documentation

## 2010-10-26 DIAGNOSIS — J438 Other emphysema: Secondary | ICD-10-CM | POA: Insufficient documentation

## 2010-10-26 DIAGNOSIS — D492 Neoplasm of unspecified behavior of bone, soft tissue, and skin: Secondary | ICD-10-CM | POA: Insufficient documentation

## 2010-10-26 MED ORDER — IOHEXOL 300 MG/ML  SOLN
100.0000 mL | Freq: Once | INTRAMUSCULAR | Status: AC | PRN
  Administered 2010-10-26: 100 mL via INTRAVENOUS

## 2010-10-29 ENCOUNTER — Other Ambulatory Visit: Payer: Self-pay | Admitting: Internal Medicine

## 2010-10-29 ENCOUNTER — Encounter (HOSPITAL_BASED_OUTPATIENT_CLINIC_OR_DEPARTMENT_OTHER): Payer: Medicare Other | Admitting: Internal Medicine

## 2010-10-29 DIAGNOSIS — C3410 Malignant neoplasm of upper lobe, unspecified bronchus or lung: Secondary | ICD-10-CM

## 2010-10-29 DIAGNOSIS — C7931 Secondary malignant neoplasm of brain: Secondary | ICD-10-CM

## 2010-10-29 DIAGNOSIS — Z5111 Encounter for antineoplastic chemotherapy: Secondary | ICD-10-CM

## 2010-10-29 DIAGNOSIS — C7951 Secondary malignant neoplasm of bone: Secondary | ICD-10-CM

## 2010-10-29 LAB — COMPREHENSIVE METABOLIC PANEL
Albumin: 3.6 g/dL (ref 3.5–5.2)
Alkaline Phosphatase: 91 U/L (ref 39–117)
BUN: 12 mg/dL (ref 6–23)
Calcium: 9.8 mg/dL (ref 8.4–10.5)
Chloride: 103 mEq/L (ref 96–112)
Glucose, Bld: 101 mg/dL — ABNORMAL HIGH (ref 70–99)
Potassium: 3.9 mEq/L (ref 3.5–5.3)
Sodium: 140 mEq/L (ref 135–145)
Total Protein: 7.2 g/dL (ref 6.0–8.3)

## 2010-10-29 LAB — CBC WITH DIFFERENTIAL/PLATELET
Basophils Absolute: 0 10*3/uL (ref 0.0–0.1)
Eosinophils Absolute: 0.1 10*3/uL (ref 0.0–0.5)
HGB: 12.3 g/dL (ref 11.6–15.9)
MONO#: 1.1 10*3/uL — ABNORMAL HIGH (ref 0.1–0.9)
MONO%: 13.9 % (ref 0.0–14.0)
NEUT#: 4.7 10*3/uL (ref 1.5–6.5)
RBC: 4.17 10*6/uL (ref 3.70–5.45)
RDW: 16 % — ABNORMAL HIGH (ref 11.2–14.5)
WBC: 7.7 10*3/uL (ref 3.9–10.3)
lymph#: 1.8 10*3/uL (ref 0.9–3.3)

## 2010-10-30 ENCOUNTER — Encounter (HOSPITAL_BASED_OUTPATIENT_CLINIC_OR_DEPARTMENT_OTHER): Payer: Medicare Other | Admitting: Internal Medicine

## 2010-10-30 DIAGNOSIS — Z5111 Encounter for antineoplastic chemotherapy: Secondary | ICD-10-CM

## 2010-10-30 DIAGNOSIS — C3410 Malignant neoplasm of upper lobe, unspecified bronchus or lung: Secondary | ICD-10-CM

## 2010-10-30 DIAGNOSIS — C7931 Secondary malignant neoplasm of brain: Secondary | ICD-10-CM

## 2010-11-08 ENCOUNTER — Other Ambulatory Visit: Payer: Self-pay | Admitting: Family Medicine

## 2010-11-19 ENCOUNTER — Other Ambulatory Visit: Payer: Self-pay | Admitting: Internal Medicine

## 2010-11-19 DIAGNOSIS — C3490 Malignant neoplasm of unspecified part of unspecified bronchus or lung: Secondary | ICD-10-CM

## 2010-11-19 DIAGNOSIS — C3411 Malignant neoplasm of upper lobe, right bronchus or lung: Secondary | ICD-10-CM | POA: Insufficient documentation

## 2010-11-20 ENCOUNTER — Ambulatory Visit (HOSPITAL_BASED_OUTPATIENT_CLINIC_OR_DEPARTMENT_OTHER): Payer: Medicare Other

## 2010-11-20 ENCOUNTER — Encounter: Payer: Self-pay | Admitting: Physician Assistant

## 2010-11-20 ENCOUNTER — Telehealth: Payer: Self-pay | Admitting: Internal Medicine

## 2010-11-20 ENCOUNTER — Other Ambulatory Visit: Payer: Self-pay | Admitting: Internal Medicine

## 2010-11-20 ENCOUNTER — Other Ambulatory Visit (HOSPITAL_BASED_OUTPATIENT_CLINIC_OR_DEPARTMENT_OTHER): Payer: Medicaid Other | Admitting: Lab

## 2010-11-20 ENCOUNTER — Ambulatory Visit (HOSPITAL_BASED_OUTPATIENT_CLINIC_OR_DEPARTMENT_OTHER): Payer: Medicare Other | Admitting: Physician Assistant

## 2010-11-20 VITALS — BP 107/72 | HR 96 | Temp 98.5°F | Ht 64.0 in | Wt 159.4 lb

## 2010-11-20 DIAGNOSIS — C7952 Secondary malignant neoplasm of bone marrow: Secondary | ICD-10-CM

## 2010-11-20 DIAGNOSIS — Z5111 Encounter for antineoplastic chemotherapy: Secondary | ICD-10-CM

## 2010-11-20 DIAGNOSIS — C3410 Malignant neoplasm of upper lobe, unspecified bronchus or lung: Secondary | ICD-10-CM

## 2010-11-20 DIAGNOSIS — C3490 Malignant neoplasm of unspecified part of unspecified bronchus or lung: Secondary | ICD-10-CM

## 2010-11-20 DIAGNOSIS — C7931 Secondary malignant neoplasm of brain: Secondary | ICD-10-CM

## 2010-11-20 DIAGNOSIS — R0602 Shortness of breath: Secondary | ICD-10-CM

## 2010-11-20 LAB — COMPREHENSIVE METABOLIC PANEL
AST: 23 U/L (ref 0–37)
BUN: 10 mg/dL (ref 6–23)
CO2: 25 mEq/L (ref 19–32)
Calcium: 9.6 mg/dL (ref 8.4–10.5)
Chloride: 101 mEq/L (ref 96–112)
Creatinine, Ser: 0.6 mg/dL (ref 0.50–1.10)
Glucose, Bld: 97 mg/dL (ref 70–99)

## 2010-11-20 LAB — CBC WITH DIFFERENTIAL/PLATELET
EOS%: 0.7 % (ref 0.0–7.0)
Eosinophils Absolute: 0.1 10*3/uL (ref 0.0–0.5)
HGB: 11.8 g/dL (ref 11.6–15.9)
MCH: 28.6 pg (ref 25.1–34.0)
MCV: 88.4 fL (ref 79.5–101.0)
MONO%: 14.2 % — ABNORMAL HIGH (ref 0.0–14.0)
NEUT#: 4.6 10*3/uL (ref 1.5–6.5)
RBC: 4.13 10*6/uL (ref 3.70–5.45)
RDW: 14.9 % — ABNORMAL HIGH (ref 11.2–14.5)
lymph#: 2.3 10*3/uL (ref 0.9–3.3)

## 2010-11-20 MED ORDER — SODIUM CHLORIDE 0.9 % IJ SOLN
10.0000 mL | INTRAMUSCULAR | Status: DC | PRN
  Administered 2010-11-20: 10 mL
  Filled 2010-11-20: qty 10

## 2010-11-20 MED ORDER — SODIUM CHLORIDE 0.9 % IV SOLN
500.0000 mg/m2 | Freq: Once | INTRAVENOUS | Status: AC
  Administered 2010-11-20: 900 mg via INTRAVENOUS
  Filled 2010-11-20: qty 36

## 2010-11-20 MED ORDER — CYANOCOBALAMIN 1000 MCG/ML IJ SOLN
1000.0000 ug | Freq: Once | INTRAMUSCULAR | Status: AC
  Administered 2010-11-20: 1000 ug via INTRAMUSCULAR

## 2010-11-20 MED ORDER — DEXAMETHASONE SODIUM PHOSPHATE 10 MG/ML IJ SOLN
10.0000 mg | Freq: Once | INTRAMUSCULAR | Status: AC
  Administered 2010-11-20: 10 mg via INTRAVENOUS

## 2010-11-20 MED ORDER — HEPARIN SOD (PORK) LOCK FLUSH 100 UNIT/ML IV SOLN
500.0000 [IU] | Freq: Once | INTRAVENOUS | Status: AC | PRN
  Administered 2010-11-20: 500 [IU]
  Filled 2010-11-20: qty 5

## 2010-11-20 MED ORDER — ONDANSETRON 16 MG/50ML IVPB (CHCC)
8.0000 mg | Freq: Once | INTRAVENOUS | Status: AC
  Administered 2010-11-20: 8 mg via INTRAVENOUS

## 2010-11-20 MED ORDER — SODIUM CHLORIDE 0.9 % IV SOLN
Freq: Once | INTRAVENOUS | Status: AC
  Administered 2010-11-20: 15:00:00 via INTRAVENOUS

## 2010-11-20 NOTE — Progress Notes (Signed)
No images are attached to the encounter. No scans are attached to the encounter. No scans are attached to the encounter. [facility] OFFICE PROGRESS NOTE  [name], MD, MD [address] [place] [address]  DIAGNOSIS: Metastatic non-small cell lung cancer initially diagnosed with locally advanced stage IIIB with right Pancoast tumor involving the vertebral body as well as the foraminal canal invasion with spinal cord compression in October of 2007.  PRIOR THERAPY: 1. Status post concurrent chemoradiation with weekly carboplatin and paclitaxel, last dose was given November 18, 2005. 2. 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 3. Status post gamma knife radiotherapy to a solitary brain lesion located in the superior frontal area of the brain at [facility] in April of 2008. 4. Status post palliative radiotherapy to the lateral abdominal wall metastatic lesion under the care of Dr. Mitzi Hansen, completed March of 2009. 5. Status post 6 cycles of systemic chemotherapy with carboplatin and Alimta.  Last dose was given July 26, 2007 with disease stabilization.  CURRENT THERAPY: :  Maintenance chemotherapy with Alimta 500 mg per meter squared given every 3 weeks.  The patient is status post 56 cycles.  INTERVAL HISTORY: Danielle [name] 60 y.o. female returns for scheduled regular 3 with visit for followup of her metastatic non-small cell lung cancer. She reports that she has to take 2 of her 15 mg immediate release morphine tablets for breakthrough pain. She will be due for refills of her medications next week. We will increase her breakthrough pain medication to a 30 mg immediate release morphine tablet with her next prescription refill. She continues to have some mild shortness of breath with exertion but voices no other complaints. She reports that her sister Aram Beecham had  surgery this morning of a total hysterectomy for cancer that was caught very early.   MEDICAL HISTORY: Past Medical History  Diagnosis Date  . Hypertension   . Anemia   . H/O: pneumonia   . History of tobacco abuse quit 10/08    on nicotine patch  . Fibromyalgia   . Hypokalemia   . DJD (degenerative joint disease), cervical   . 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 had chemo and radiation for lung ca    ALLERGIES:  is allergic to contrast media; co-trimoxazole injection; iohexol; and sulfa drugs cross reactors.  MEDICATIONS:  Current Outpatient Prescriptions  Medication Sig Dispense Refill  . ALPRAZolam (XANAX) 0.25 MG tablet Take 0.25 mg by mouth at bedtime as needed.        Marland Kitchen amLODipine (NORVASC) 10 MG tablet TAKE 1 TABLET EVERY DAY  90 tablet  1  . antipyrine-benzocaine (AURALGAN) otic solution Place 3 drops into both ears every 2 (two) hours as needed.        . folic acid (FOLVITE) 1 MG tablet Take 1 mg by mouth daily.        Marland Kitchen lisinopril (PRINIVIL,ZESTRIL) 5 MG tablet TAKE 1 TABLET BY MOUTH DAILY.  90 tablet  1  . morphine (MS CONTIN) 15 MG 12 hr tablet Take 15 mg by mouth. Every 6 hours as needed       . morphine (MS CONTIN) 60 MG 12 hr tablet Take 60 mg by mouth. Every 12 hours       . PATANOL 0.1 % ophthalmic solution PUT 1 DROP IN AFFECTED EYE(S) 2X A DAY  5 mL  1  . prochlorperazine (COMPAZINE) 10 MG tablet Take 10 mg by mouth every 6 (six) hours as needed.        . simvastatin (ZOCOR) 40 MG tablet 1 TAB BY MOUTH DAILY  30 tablet  10  . traZODone (DESYREL) 50 MG tablet Take 50 mg by mouth at bedtime.          SURGICAL HISTORY:  Past Surgical History  Procedure Date  . Cervical laminectomy 1995  . Other surgical history 2008    Gamma knife surgery to remove brain met   . 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 Knee surgery 1990    Left x 2    REVIEW OF SYSTEMS:  A comprehensive review of systems was  negative except for: Respiratory: positive for dyspnea on exertion   PHYSICAL EXAMINATION: General appearance: alert, cooperative, appears stated age, no distress and With mild cushingoid appearance to the face Head: Normocephalic, without obvious abnormality, atraumatic Neck: no adenopathy, no carotid bruit, no JVD, supple, symmetrical, trachea midline and thyroid not enlarged, symmetric, no tenderness/mass/nodules Lymph nodes: Cervical, supraclavicular, and axillary nodes normal. Resp: clear to auscultation bilaterally Cardio: regular rate and rhythm, S1, S2 normal, no murmur, click, rub or gallop GI: soft, non-tender; bowel sounds normal; no masses,  no organomegaly Extremities: extremities normal, atraumatic, no cyanosis or edema  ECOG PERFORMANCE STATUS: 1 - Symptomatic but completely ambulatory  Blood pressure 107/72, pulse 96, temperature 98.5 F (36.9 C), height 5\' 4"  (1.626 m), weight 159 lb 6.4 oz (72.303 kg).  LABORATORY DATA: Lab Results  Component Value Date   WBC 9.1 01/30/2008   HGB 11.8 11/20/2010   HCT 36.5 11/20/2010   MCV 88.4 11/20/2010   PLT 332 11/20/2010      Chemistry      Component Value Date/Time   NA 140 10/29/2010 1449   K 3.9 10/29/2010 1449   CL 103 10/29/2010 1449   CO2 29 10/29/2010 1449   BUN 12 10/29/2010 1449   CREATININE 0.67 10/29/2010 1449      Component Value Date/Time   CALCIUM 9.8 10/29/2010 1449   ALKPHOS 91 10/29/2010 1449   AST 24 10/29/2010 1449   ALT 18 10/29/2010 1449   BILITOT 0.2* 10/29/2010 1449       RADIOGRAPHIC STUDIES:  No results found.  ASSESSMENT/PLAN: This is a very pleasant 60 year old white female with a history of metastatic non-small cell lung cancer diagnosed almost 5 years ago. She continues to do well on her maintenance chemotherapy with Alimta. She will proceed with cycle #57 today and return in 3 weeks with repeat CBC differential and CMETt prior to cycle #58 of her maintenance chemotherapy with Alimta  at 500 mg per meter squared given every 3 weeks. As previously stated since she is having to take 2 of her 15 mg immediate release morphine for breakthrough pain we will change her next prescription 2 at 30 mg tablet.     Conni Slipper, PA-C     All questions were answered. The patient knows to call the clinic with any problems, questions or concerns. We can certainly see the patient much sooner if necessary.

## 2010-11-20 NOTE — Telephone Encounter (Signed)
gv pt appt schedule for dec.  °

## 2010-11-26 ENCOUNTER — Other Ambulatory Visit: Payer: Self-pay | Admitting: *Deleted

## 2010-11-26 DIAGNOSIS — C3490 Malignant neoplasm of unspecified part of unspecified bronchus or lung: Secondary | ICD-10-CM

## 2010-11-26 MED ORDER — ALPRAZOLAM 0.25 MG PO TABS: 0.2500 mg | ORAL_TABLET | Freq: Every evening | ORAL | Status: DC | PRN

## 2010-11-26 MED ORDER — MORPHINE SULFATE 30 MG PO TABS: 30.0000 mg | ORAL_TABLET | ORAL | Status: AC | PRN

## 2010-11-26 MED ORDER — MORPHINE SULFATE CR 60 MG PO TB12: 60.0000 mg | ORAL_TABLET | Freq: Two times a day (BID) | ORAL | Status: DC

## 2010-12-11 ENCOUNTER — Other Ambulatory Visit: Payer: Self-pay | Admitting: Internal Medicine

## 2010-12-11 ENCOUNTER — Other Ambulatory Visit (HOSPITAL_BASED_OUTPATIENT_CLINIC_OR_DEPARTMENT_OTHER): Payer: Medicare Other | Admitting: Lab

## 2010-12-11 ENCOUNTER — Ambulatory Visit (HOSPITAL_BASED_OUTPATIENT_CLINIC_OR_DEPARTMENT_OTHER): Payer: Medicare Other | Admitting: Physician Assistant

## 2010-12-11 ENCOUNTER — Ambulatory Visit (HOSPITAL_BASED_OUTPATIENT_CLINIC_OR_DEPARTMENT_OTHER): Payer: Medicare Other

## 2010-12-11 ENCOUNTER — Encounter: Payer: Self-pay | Admitting: Physician Assistant

## 2010-12-11 ENCOUNTER — Telehealth: Payer: Self-pay | Admitting: Internal Medicine

## 2010-12-11 VITALS — BP 124/77 | HR 84 | Temp 97.6°F | Ht 64.0 in | Wt 157.9 lb

## 2010-12-11 DIAGNOSIS — C3410 Malignant neoplasm of upper lobe, unspecified bronchus or lung: Secondary | ICD-10-CM

## 2010-12-11 DIAGNOSIS — C7949 Secondary malignant neoplasm of other parts of nervous system: Secondary | ICD-10-CM

## 2010-12-11 DIAGNOSIS — C3490 Malignant neoplasm of unspecified part of unspecified bronchus or lung: Secondary | ICD-10-CM

## 2010-12-11 DIAGNOSIS — C7931 Secondary malignant neoplasm of brain: Secondary | ICD-10-CM

## 2010-12-11 DIAGNOSIS — C7951 Secondary malignant neoplasm of bone: Secondary | ICD-10-CM

## 2010-12-11 DIAGNOSIS — B370 Candidal stomatitis: Secondary | ICD-10-CM

## 2010-12-11 DIAGNOSIS — C7952 Secondary malignant neoplasm of bone marrow: Secondary | ICD-10-CM

## 2010-12-11 DIAGNOSIS — Z5111 Encounter for antineoplastic chemotherapy: Secondary | ICD-10-CM

## 2010-12-11 HISTORY — DX: Candidal stomatitis: B37.0

## 2010-12-11 LAB — COMPREHENSIVE METABOLIC PANEL
ALT: 11 U/L (ref 0–35)
AST: 17 U/L (ref 0–37)
Albumin: 4.1 g/dL (ref 3.5–5.2)
CO2: 24 mEq/L (ref 19–32)
Calcium: 9.2 mg/dL (ref 8.4–10.5)
Chloride: 105 mEq/L (ref 96–112)
Potassium: 3.8 mEq/L (ref 3.5–5.3)
Sodium: 139 mEq/L (ref 135–145)
Total Protein: 6.8 g/dL (ref 6.0–8.3)

## 2010-12-11 LAB — CBC WITH DIFFERENTIAL/PLATELET
BASO%: 0.3 % (ref 0.0–2.0)
EOS%: 1 % (ref 0.0–7.0)
LYMPH%: 30.9 % (ref 14.0–49.7)
MCH: 28.8 pg (ref 25.1–34.0)
MCHC: 33.6 g/dL (ref 31.5–36.0)
MONO#: 0.7 10*3/uL (ref 0.1–0.9)
RBC: 4.44 10*6/uL (ref 3.70–5.45)
WBC: 6.9 10*3/uL (ref 3.9–10.3)
lymph#: 2.1 10*3/uL (ref 0.9–3.3)

## 2010-12-11 MED ORDER — SODIUM CHLORIDE 0.9 % IV SOLN
500.0000 mg/m2 | Freq: Once | INTRAVENOUS | Status: AC
  Administered 2010-12-11: 900 mg via INTRAVENOUS
  Filled 2010-12-11: qty 36

## 2010-12-11 MED ORDER — HEPARIN SOD (PORK) LOCK FLUSH 100 UNIT/ML IV SOLN
500.0000 [IU] | Freq: Once | INTRAVENOUS | Status: AC | PRN
  Administered 2010-12-11: 500 [IU]
  Filled 2010-12-11: qty 5

## 2010-12-11 MED ORDER — SODIUM CHLORIDE 0.9 % IV SOLN
Freq: Once | INTRAVENOUS | Status: AC
  Administered 2010-12-11: 10:00:00 via INTRAVENOUS

## 2010-12-11 MED ORDER — CYANOCOBALAMIN 1000 MCG/ML IJ SOLN
1000.0000 ug | Freq: Once | INTRAMUSCULAR | Status: AC
  Administered 2010-12-11: 1000 ug via INTRAMUSCULAR

## 2010-12-11 MED ORDER — PREDNISONE 50 MG PO TABS: ORAL_TABLET | ORAL | Status: DC

## 2010-12-11 MED ORDER — FLUCONAZOLE 100 MG PO TABS: ORAL_TABLET | ORAL | Status: DC

## 2010-12-11 MED ORDER — ONDANSETRON 8 MG/50ML IVPB (CHCC)
8.0000 mg | Freq: Once | INTRAVENOUS | Status: AC
  Administered 2010-12-11: 8 mg via INTRAVENOUS

## 2010-12-11 MED ORDER — DEXAMETHASONE SODIUM PHOSPHATE 10 MG/ML IJ SOLN
10.0000 mg | Freq: Once | INTRAMUSCULAR | Status: AC
  Administered 2010-12-11: 10 mg via INTRAVENOUS

## 2010-12-11 MED ORDER — SODIUM CHLORIDE 0.9 % IJ SOLN
10.0000 mL | INTRAMUSCULAR | Status: DC | PRN
  Administered 2010-12-11: 10 mL
  Filled 2010-12-11: qty 10

## 2010-12-11 NOTE — Patient Instructions (Signed)
1125 Pt verbalized understanding of next appt date/time and to call if problems

## 2010-12-11 NOTE — Telephone Encounter (Signed)
gv pt appt schedule for dec including ct for 12/20

## 2010-12-11 NOTE — Progress Notes (Signed)
No images are attached to the encounter. No scans are attached to the encounter. No scans are attached to the encounter.  [facility] OFFICE PROGRESS NOTE  [name], MD, MD [address] [place] [address]  DIAGNOSIS: Metastatic non-small cell lung cancer initially diagnosed with locally advanced stage IIIB with right Pancoast tumor involving the vertebral body as well as the foraminal canal invasion with spinal cord compression in October of 2007.  PRIOR THERAPY: 1. Status post concurrent chemoradiation with weekly carboplatin and paclitaxel, last dose was given November 18, 2005. 2. 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 3. Status post gamma knife radiotherapy to a solitary brain lesion located in the superior frontal area of the brain at [facility] in April of 2008. 4. Status post palliative radiotherapy to the lateral abdominal wall metastatic lesion under the care of Dr. Mitzi Hansen, completed March of 2009. 5. Status post 6 cycles of systemic chemotherapy with carboplatin and Alimta.  Last dose was given July 26, 2007 with disease stabilization.  CURRENT THERAPY: :  Maintenance chemotherapy with Alimta 500 mg per meter squared given every 3 weeks.  The patient is status post 57 cycles.  INTERVAL HISTORY: Danielle [name] 60 y.o. female returns for scheduled regular 3 with visit for followup of her metastatic non-small cell lung cancer. Overall she continues to tolerate her maintenance chemotherapy with Alimta without significant difficulty. She does report decreased appetite. She also states she has a broken tooth in her mouth on the top right region. Her dentist plans to file this down for her she will schedule this for a week prior to her next chemotherapy. She will be due for prescription refills in the next week or so. As outlined in my previous note her  breakthrough medication has now been increased to MSIR 30 mg tablet. MEDICAL HISTORY: Past Medical History  Diagnosis Date  . Hypertension   . Anemia   . H/O: pneumonia   . History of tobacco abuse quit 10/08    on nicotine patch  . Fibromyalgia   . Hypokalemia   . DJD (degenerative joint disease), cervical   . 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 had chemo and radiation for lung ca  . Thrush 12/11/2010    ALLERGIES:  is allergic to contrast media; co-trimoxazole injection; iohexol; and sulfa drugs cross reactors.  MEDICATIONS:  Current Outpatient Prescriptions  Medication Sig Dispense Refill  . ALPRAZolam (XANAX) 0.25 MG tablet Take 1 tablet (0.25 mg total) by mouth at bedtime as needed.  30 tablet  0  . amLODipine (NORVASC) 10 MG tablet TAKE 1 TABLET EVERY DAY  90 tablet  1  . folic acid (FOLVITE) 1 MG tablet Take 1 mg by mouth daily.        Marland Kitchen lisinopril (PRINIVIL,ZESTRIL) 5 MG tablet TAKE 1 TABLET BY MOUTH DAILY.  90 tablet  1  . morphine (MS CONTIN) 60 MG 12 hr tablet Take 1 tablet (60 mg total) by mouth 2 (two) times daily. Every 12 hours  60 tablet  0  . morphine (MSIR) 30 MG tablet Take 30 mg by mouth every 4 (four) hours as needed. Take 1 tab every 4-6 hours as needed for breakthrough pain       . PATANOL 0.1 % ophthalmic solution PUT 1 DROP IN AFFECTED EYE(S) 2X A DAY  5 mL  1  .  prochlorperazine (COMPAZINE) 10 MG tablet Take 10 mg by mouth every 6 (six) hours as needed.        . simvastatin (ZOCOR) 40 MG tablet 1 TAB BY MOUTH DAILY  30 tablet  10  . traZODone (DESYREL) 50 MG tablet Take 50 mg by mouth at bedtime.        Marland Kitchen antipyrine-benzocaine (AURALGAN) otic solution Place 3 drops into both ears every 2 (two) hours as needed.        . fluconazole (DIFLUCAN) 100 MG tablet Take 2 tabs  Stat, then take 1 tab daily until completed  16 tablet  0  . predniSONE (DELTASONE) 50 MG tablet Take one tab with food, 13 hrs, 7 hrs and 1 hr before CT scan   3 tablet  2    SURGICAL HISTORY:  Past Surgical History  Procedure Date  . Cervical laminectomy 1995  . Other surgical history 2008    Gamma knife surgery to remove brain met   . 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 Knee surgery 1990    Left x 2  . Portacath placement -[name]    TIP IN CAVOATRIAL JUNCTION    REVIEW OF SYSTEMS:  A comprehensive review of systems was negative except for: Constitutional: positive for anorexia Ears, nose, mouth, throat, and face: positive for thrush   PHYSICAL EXAMINATION: General appearance: alert, cooperative, appears stated age, no distress and With mild cushingoid appearance to the face Head: Normocephalic, without obvious abnormality, atraumatic Neck: no adenopathy, no carotid bruit, no JVD, supple, symmetrical, trachea midline and thyroid not enlarged, symmetric, no tenderness/mass/nodules Lymph nodes: Cervical, supraclavicular, and axillary nodes normal. Resp: clear to auscultation bilaterally Cardio: regular rate and rhythm, S1, S2 normal, no murmur, click, rub or gallop GI: soft, non-tender; bowel sounds normal; no masses,  no organomegaly Extremities: extremities normal, atraumatic, no cyanosis or edema Mouth: thrush is present  ECOG PERFORMANCE STATUS: 1 - Symptomatic but completely ambulatory  Blood pressure 124/77, pulse 84, temperature 97.6 F (36.4 C), temperature source Oral, height 5\' 4"  (1.626 m), weight 157 lb 14.4 oz (71.623 kg).  LABORATORY DATA: Lab Results  Component Value Date   WBC 6.9 12/11/2010   HGB 12.8 12/11/2010   HCT 38.1 12/11/2010   MCV 85.8 12/11/2010   PLT 340 12/11/2010      Chemistry      Component Value Date/Time   NA 136 11/20/2010 1319   K 4.2 11/20/2010 1319   CL 101 11/20/2010 1319   CO2 25 11/20/2010 1319   BUN 10 11/20/2010 1319   CREATININE 0.60 11/20/2010 1319      Component Value Date/Time   CALCIUM 9.6 11/20/2010 1319   ALKPHOS 91 11/20/2010 1319   AST  23 11/20/2010 1319   ALT 13 11/20/2010 1319   BILITOT 0.2* 11/20/2010 1319       RADIOGRAPHIC STUDIES:  No results found.  ASSESSMENT/PLAN: This is a very pleasant 60 year old white female with a history of metastatic non-small cell lung cancer diagnosed almost 5 years ago. She continues to do well on her maintenance chemotherapy with Alimta. She will proceed with cycle #58 today and return in 3 weeks with repeat CBC differential and CMETt prior to cycle #59of her maintenance chemotherapy with Alimta at 500 mg per meter squared given every 3 weeks. She will also have a restaging CT of the chest abdomen and pelvis with contrast to reevaluate her disease and will discuss results with  Dr. Gwenyth Bouillon when she returns in 3 weeks. A prescription for prednisone 50 mg tablets one tablet by mouth at 13 hours, 7 hours, and 1 hour prior to her CT scan number is 3 tablets with 2 refills as well as a prescription for Diflucan, 100 mg tablets 2 by mouth stat, then 1 by mouth daily until completed a total of16 with no refill will be sent via E. prescription to her pharmacy of record.   Conni Slipper, PA-C     All questions were answered. The patient knows to call the clinic with any problems, questions or concerns. We can certainly see the patient much sooner if necessary.

## 2010-12-27 ENCOUNTER — Ambulatory Visit (HOSPITAL_COMMUNITY)
Admission: RE | Admit: 2010-12-27 | Discharge: 2010-12-27 | Disposition: A | Discharge status: Home / Self Care | Payer: Medicare Other | Source: Ambulatory Visit | Attending: Physician Assistant | Admitting: Physician Assistant

## 2010-12-27 DIAGNOSIS — N289 Disorder of kidney and ureter, unspecified: Secondary | ICD-10-CM | POA: Insufficient documentation

## 2010-12-27 DIAGNOSIS — K5900 Constipation, unspecified: Secondary | ICD-10-CM | POA: Insufficient documentation

## 2010-12-27 DIAGNOSIS — C3490 Malignant neoplasm of unspecified part of unspecified bronchus or lung: Secondary | ICD-10-CM | POA: Insufficient documentation

## 2010-12-27 DIAGNOSIS — B370 Candidal stomatitis: Secondary | ICD-10-CM | POA: Insufficient documentation

## 2010-12-27 DIAGNOSIS — M87059 Idiopathic aseptic necrosis of unspecified femur: Secondary | ICD-10-CM | POA: Insufficient documentation

## 2010-12-27 DIAGNOSIS — J438 Other emphysema: Secondary | ICD-10-CM | POA: Insufficient documentation

## 2010-12-27 DIAGNOSIS — M549 Dorsalgia, unspecified: Secondary | ICD-10-CM | POA: Insufficient documentation

## 2010-12-27 DIAGNOSIS — I2510 Atherosclerotic heart disease of native coronary artery without angina pectoris: Secondary | ICD-10-CM | POA: Insufficient documentation

## 2010-12-27 MED ORDER — IOHEXOL 300 MG/ML  SOLN
100.0000 mL | Freq: Once | INTRAMUSCULAR | Status: AC | PRN
  Administered 2010-12-27: 100 mL via INTRAVENOUS

## 2010-12-31 ENCOUNTER — Ambulatory Visit (HOSPITAL_BASED_OUTPATIENT_CLINIC_OR_DEPARTMENT_OTHER): Payer: Medicare Other | Admitting: Internal Medicine

## 2010-12-31 ENCOUNTER — Other Ambulatory Visit: Payer: Self-pay | Admitting: Internal Medicine

## 2010-12-31 ENCOUNTER — Other Ambulatory Visit (HOSPITAL_BASED_OUTPATIENT_CLINIC_OR_DEPARTMENT_OTHER): Payer: Medicare Other | Admitting: Lab

## 2010-12-31 ENCOUNTER — Ambulatory Visit (HOSPITAL_BASED_OUTPATIENT_CLINIC_OR_DEPARTMENT_OTHER): Payer: Medicare Other

## 2010-12-31 VITALS — BP 111/72 | HR 81 | Temp 96.8°F | Ht 64.0 in | Wt 156.3 lb

## 2010-12-31 DIAGNOSIS — Z5111 Encounter for antineoplastic chemotherapy: Secondary | ICD-10-CM

## 2010-12-31 DIAGNOSIS — C7952 Secondary malignant neoplasm of bone marrow: Secondary | ICD-10-CM

## 2010-12-31 DIAGNOSIS — C3410 Malignant neoplasm of upper lobe, unspecified bronchus or lung: Secondary | ICD-10-CM

## 2010-12-31 DIAGNOSIS — C3490 Malignant neoplasm of unspecified part of unspecified bronchus or lung: Secondary | ICD-10-CM

## 2010-12-31 DIAGNOSIS — C7951 Secondary malignant neoplasm of bone: Secondary | ICD-10-CM

## 2010-12-31 DIAGNOSIS — C7949 Secondary malignant neoplasm of other parts of nervous system: Secondary | ICD-10-CM

## 2010-12-31 DIAGNOSIS — B370 Candidal stomatitis: Secondary | ICD-10-CM

## 2010-12-31 DIAGNOSIS — R110 Nausea: Secondary | ICD-10-CM

## 2010-12-31 LAB — CBC WITH DIFFERENTIAL/PLATELET
Basophils Absolute: 0 10*3/uL (ref 0.0–0.1)
Eosinophils Absolute: 0.1 10*3/uL (ref 0.0–0.5)
HCT: 38.3 % (ref 34.8–46.6)
HGB: 12.7 g/dL (ref 11.6–15.9)
MCV: 85.3 fL (ref 79.5–101.0)
NEUT#: 3.8 10*3/uL (ref 1.5–6.5)
RDW: 14 % (ref 11.2–14.5)
lymph#: 2.8 10*3/uL (ref 0.9–3.3)

## 2010-12-31 LAB — COMPREHENSIVE METABOLIC PANEL
Albumin: 3.8 g/dL (ref 3.5–5.2)
Alkaline Phosphatase: 65 U/L (ref 39–117)
BUN: 18 mg/dL (ref 6–23)
Creatinine, Ser: 0.58 mg/dL (ref 0.50–1.10)
Glucose, Bld: 125 mg/dL — ABNORMAL HIGH (ref 70–99)
Potassium: 4.1 mEq/L (ref 3.5–5.3)

## 2010-12-31 MED ORDER — HEPARIN SOD (PORK) LOCK FLUSH 100 UNIT/ML IV SOLN
500.0000 [IU] | Freq: Once | INTRAVENOUS | Status: AC | PRN
  Administered 2010-12-31: 500 [IU]
  Filled 2010-12-31: qty 5

## 2010-12-31 MED ORDER — SODIUM CHLORIDE 0.9 % IV SOLN
Freq: Once | INTRAVENOUS | Status: AC
  Administered 2010-12-31: 10:00:00 via INTRAVENOUS

## 2010-12-31 MED ORDER — ALPRAZOLAM 0.25 MG PO TABS: 0.2500 mg | ORAL_TABLET | Freq: Every evening | ORAL | Status: DC | PRN

## 2010-12-31 MED ORDER — ONDANSETRON 8 MG/50ML IVPB (CHCC)
8.0000 mg | Freq: Once | INTRAVENOUS | Status: AC
  Administered 2010-12-31: 8 mg via INTRAVENOUS

## 2010-12-31 MED ORDER — MORPHINE SULFATE CR 60 MG PO TB12: 60.0000 mg | ORAL_TABLET | Freq: Two times a day (BID) | ORAL | Status: DC

## 2010-12-31 MED ORDER — MORPHINE SULFATE 30 MG PO TABS: 30.0000 mg | ORAL_TABLET | ORAL | Status: DC | PRN

## 2010-12-31 MED ORDER — ONDANSETRON HCL 8 MG PO TABS: 8.0000 mg | ORAL_TABLET | Freq: Three times a day (TID) | ORAL | Status: AC | PRN

## 2010-12-31 MED ORDER — SODIUM CHLORIDE 0.9 % IJ SOLN
10.0000 mL | INTRAMUSCULAR | Status: DC | PRN
  Administered 2010-12-31: 10 mL
  Filled 2010-12-31: qty 10

## 2010-12-31 MED ORDER — DEXAMETHASONE SODIUM PHOSPHATE 10 MG/ML IJ SOLN
10.0000 mg | Freq: Once | INTRAMUSCULAR | Status: AC
  Administered 2010-12-31: 10 mg via INTRAVENOUS

## 2010-12-31 MED ORDER — SODIUM CHLORIDE 0.9 % IV SOLN
500.0000 mg/m2 | Freq: Once | INTRAVENOUS | Status: AC
  Administered 2010-12-31: 900 mg via INTRAVENOUS
  Filled 2010-12-31: qty 36

## 2010-12-31 NOTE — Telephone Encounter (Signed)
appt made for lab dr and rx for 01/22/11,printed for pt    aom

## 2010-12-31 NOTE — Progress Notes (Signed)
[facility] OFFICE PROGRESS NOTE  [name], MD, MD [address] [place] [address]  DIAGNOSIS: Metastatic non-small cell lung cancer initially diagnosed with locally advanced stage IIIB with right Pancoast tumor involving the vertebral body as well as the foraminal canal invasion with spinal cord compression in October of 2007.   PRIOR THERAPY:  1. Status post concurrent chemoradiation with weekly carboplatin and paclitaxel, last dose was given November 18, 2005. 2. 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 3. Status post gamma knife radiotherapy to a solitary brain lesion located in the superior frontal area of the brain at [facility] in April of 2008. 4. Status post palliative radiotherapy to the lateral abdominal wall metastatic lesion under the care of Dr. Mitzi Hansen, completed March of 2009. 5. Status post 6 cycles of systemic chemotherapy with carboplatin and Alimta. Last dose was given July 26, 2007 with disease stabilization. 6.  CURRENT THERAPY: : Maintenance chemotherapy with Alimta 500 mg per meter squared given every 3 weeks. The patient is status post 58 cycles.   INTERVAL HISTORY: Danielle [name] 60 y.o. female returns to the clinic today for followup visit accompanied by her sister. The patient is feeling fine. No specific complaints today. She denied having any significant nausea or vomiting. She has no chest pain or shortness of breath, no cough or hemoptysis. No weight loss or night sweats. She has repeat CT scan of the chest, abdomen and pelvis performed earlier today and she is here for evaluation and discussion of her scan results.  MEDICAL HISTORY: Past Medical History  Diagnosis Date  . Hypertension   . Anemia   . H/O: pneumonia   . History of tobacco abuse quit 10/08    on nicotine patch  . Fibromyalgia   . Hypokalemia   . DJD  (degenerative joint disease), cervical   . 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 had chemo and radiation for lung ca  . Thrush 12/11/2010    ALLERGIES:  is allergic to contrast media; co-trimoxazole injection; iohexol; and sulfa drugs cross reactors.  MEDICATIONS:  Current Outpatient Prescriptions  Medication Sig Dispense Refill  . ALPRAZolam (XANAX) 0.25 MG tablet Take 1 tablet (0.25 mg total) by mouth at bedtime as needed.  30 tablet  0  . amLODipine (NORVASC) 10 MG tablet TAKE 1 TABLET EVERY DAY  90 tablet  1  . antipyrine-benzocaine (AURALGAN) otic solution Place 3 drops into both ears every 2 (two) hours as needed.        . fluconazole (DIFLUCAN) 100 MG tablet Take 2 tabs  Stat, then take 1 tab daily until completed  16 tablet  0  . folic acid (FOLVITE) 1 MG tablet Take 1 mg by mouth daily.        Marland Kitchen lisinopril (PRINIVIL,ZESTRIL) 5 MG tablet TAKE 1 TABLET BY MOUTH DAILY.  90 tablet  1  . morphine (MS CONTIN) 60 MG 12 hr tablet Take 1 tablet (60 mg total) by mouth 2 (two) times daily. Every 12 hours  60 tablet  0  . morphine (MSIR) 30 MG tablet Take 30 mg by mouth every 4 (four) hours as needed. Take 1 tab every 4-6 hours as needed for breakthrough pain       . PATANOL 0.1 % ophthalmic solution PUT 1 DROP IN AFFECTED EYE(S) 2X A DAY  5 mL  1  . prochlorperazine (COMPAZINE) 10 MG tablet Take 10 mg by mouth every 6 (six) hours as needed.        . simvastatin (ZOCOR) 40 MG tablet 1 TAB BY MOUTH DAILY  30 tablet  10  . traZODone (DESYREL) 50 MG tablet Take 50 mg by mouth at bedtime.          SURGICAL HISTORY:  Past Surgical History  Procedure Date  . Cervical laminectomy 1995  . Other surgical history 2008    Gamma knife surgery to remove brain met   . 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 Knee surgery 1990    Left x 2  . Portacath placement -[name]    TIP IN CAVOATRIAL JUNCTION    REVIEW OF SYSTEMS:  A comprehensive  review of systems was negative.   PHYSICAL EXAMINATION: General appearance: alert, cooperative and no distress Head: Normocephalic, without obvious abnormality, atraumatic Neck: no adenopathy Lymph nodes: Cervical, supraclavicular, and axillary nodes normal. Resp: clear to auscultation bilaterally Cardio: regular rate and rhythm, S1, S2 normal, no murmur, click, rub or gallop GI: soft, non-tender; bowel sounds normal; no masses,  no organomegaly Extremities: extremities normal, atraumatic, no cyanosis or edema Neurologic: Alert and oriented X 3, normal strength and tone. Normal symmetric reflexes. Normal coordination and gait  ECOG PERFORMANCE STATUS: 0 - Asymptomatic  Blood pressure 111/72, pulse 81, temperature 96.8 F (36 C), temperature source Oral, height 5\' 4"  (1.626 m), weight 156 lb 4.8 oz (70.897 kg).  LABORATORY DATA: Lab Results  Component Value Date   WBC 7.4 12/31/2010   HGB 12.7 12/31/2010   HCT 38.3 12/31/2010   MCV 85.3 12/31/2010   PLT 353 12/31/2010      Chemistry      Component Value Date/Time   NA 139 12/11/2010 0839   K 3.8 12/11/2010 0839   CL 105 12/11/2010 0839   CO2 24 12/11/2010 0839   BUN 6 12/11/2010 0839   CREATININE 0.52 12/11/2010 0839      Component Value Date/Time   CALCIUM 9.2 12/11/2010 0839   ALKPHOS 94 12/11/2010 0839   AST 17 12/11/2010 0839   ALT 11 12/11/2010 0839   BILITOT 0.3 12/11/2010 0839       RADIOGRAPHIC STUDIES: Ct Chest W Contrast  12/27/2010  *RADIOLOGY REPORT*  Clinical Data:  Restaging metastatic lung cancer.  Back pain.  CT CHEST, ABDOMEN AND PELVIS WITH CONTRAST  Technique:  Multidetector CT imaging of the chest, abdomen and pelvis was performed following the standard protocol during bolus administration of intravenous contrast.  Contrast: OMNIPAQUE IOHEXOL 300 MG/ML IV SOLN  Comparison:  10/26/2010.   CT CHEST  Findings:  No pathologically enlarged mediastinal, hilar or axillary lymph nodes.  There is coronary  artery calcification. Heart size normal.  No pericardial effusion.  There is irregular soft tissue at the apex of the right hemithorax measuring approximately 2.1 x 1.4 cm, stable.  Emphysema. Subpleural density in the anterior right upper lobe (image 15) is unchanged.  No pleural fluid.  Airway is unremarkable.  IMPRESSION: Subpleural soft tissue at the apex of the right hemithorax is grossly stable.    CT ABDOMEN AND PELVIS  Findings:  Liver, gallbladder, adrenal glands and right kidney are unremarkable.  A 6 mm low attenuation lesion in the lower pole left kidney is stable and likely a cyst.  Spleen, pancreas, stomach and small bowel are unremarkable.  Stool is seen throughout  the colon.  Uterus and ovaries are visualized.  Atherosclerotic calcification of the arterial vasculature without aneurysm.  No pathologically enlarged lymph nodes.  No free fluid.  There are two thoracic vertebroplasties.  Untreated upper thoracic compression fracture (vertebral plana) is unchanged.  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  IMPRESSION:  1.  No evidence of metastatic disease in the abdomen or pelvis. 2.  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 3.  Constipation.  Original Report Authenticated By: Reyes Ivan, M.D.    ASSESSMENT: This is a very pleasant 59 years old white female with metastatic non-small cell lung cancer, adenocarcinoma. She is currently on maintenance Alimta status post 58 cycles. The patient is doing fine and tolerating her treatment fairly well. She has no evidence for disease progression. I discussed the scan results with the patient and her sister.  PLAN: I recommended for her to continue on maintenance treatment with the same regimen. She will receive cycle #59 today. The patient was given a refill for OxyContin, oxycodone, Zofran and Xanax. She would come back for followup visit in 3 weeks for reevaluation before starting  cycle #60.   All questions were answered. The patient knows to call the clinic with any problems, questions or concerns. We can certainly see the patient much sooner if necessary.

## 2011-01-10 ENCOUNTER — Other Ambulatory Visit: Payer: Medicare Other

## 2011-01-10 ENCOUNTER — Other Ambulatory Visit (INDEPENDENT_AMBULATORY_CARE_PROVIDER_SITE_OTHER): Payer: Medicare Other

## 2011-01-10 DIAGNOSIS — E7800 Pure hypercholesterolemia, unspecified: Secondary | ICD-10-CM

## 2011-01-10 DIAGNOSIS — E119 Type 2 diabetes mellitus without complications: Secondary | ICD-10-CM

## 2011-01-10 LAB — LIPID PANEL
Cholesterol: 141 mg/dL (ref 0–200)
LDL Cholesterol: 79 mg/dL (ref 0–99)
Triglycerides: 136 mg/dL (ref 0.0–149.0)
VLDL: 27.2 mg/dL (ref 0.0–40.0)

## 2011-01-11 ENCOUNTER — Telehealth: Payer: Self-pay | Admitting: Family Medicine

## 2011-01-11 NOTE — Telephone Encounter (Signed)
Message copied by Excell Seltzer on Fri Jan 11, 2011  7:06 AM ------      Message from: Mills Koller J      Created: Wed Jan 09, 2011  9:49 AM      Regarding: labs for 01-10-11       She has a f/u appt with you,1-18

## 2011-01-17 ENCOUNTER — Encounter: Payer: Medicare Other | Admitting: Family Medicine

## 2011-01-21 ENCOUNTER — Other Ambulatory Visit: Payer: Self-pay | Admitting: Internal Medicine

## 2011-01-22 ENCOUNTER — Telehealth: Payer: Self-pay | Admitting: Internal Medicine

## 2011-01-22 ENCOUNTER — Ambulatory Visit (HOSPITAL_BASED_OUTPATIENT_CLINIC_OR_DEPARTMENT_OTHER): Payer: Medicare Other | Admitting: Internal Medicine

## 2011-01-22 ENCOUNTER — Encounter: Payer: Medicare Other | Admitting: Family Medicine

## 2011-01-22 ENCOUNTER — Ambulatory Visit (HOSPITAL_BASED_OUTPATIENT_CLINIC_OR_DEPARTMENT_OTHER): Payer: Medicare Other

## 2011-01-22 ENCOUNTER — Other Ambulatory Visit (HOSPITAL_BASED_OUTPATIENT_CLINIC_OR_DEPARTMENT_OTHER): Payer: Medicare Other | Admitting: Lab

## 2011-01-22 VITALS — BP 107/68 | HR 81 | Temp 97.4°F | Ht 64.0 in | Wt 157.4 lb

## 2011-01-22 DIAGNOSIS — C7951 Secondary malignant neoplasm of bone: Secondary | ICD-10-CM | POA: Diagnosis not present

## 2011-01-22 DIAGNOSIS — C7931 Secondary malignant neoplasm of brain: Secondary | ICD-10-CM

## 2011-01-22 DIAGNOSIS — C7949 Secondary malignant neoplasm of other parts of nervous system: Secondary | ICD-10-CM

## 2011-01-22 DIAGNOSIS — C7952 Secondary malignant neoplasm of bone marrow: Secondary | ICD-10-CM | POA: Diagnosis not present

## 2011-01-22 DIAGNOSIS — C3490 Malignant neoplasm of unspecified part of unspecified bronchus or lung: Secondary | ICD-10-CM

## 2011-01-22 DIAGNOSIS — C3410 Malignant neoplasm of upper lobe, unspecified bronchus or lung: Secondary | ICD-10-CM

## 2011-01-22 DIAGNOSIS — Z5111 Encounter for antineoplastic chemotherapy: Secondary | ICD-10-CM | POA: Diagnosis not present

## 2011-01-22 LAB — CBC WITH DIFFERENTIAL/PLATELET
BASO%: 0.4 % (ref 0.0–2.0)
EOS%: 0.8 % (ref 0.0–7.0)
LYMPH%: 34 % (ref 14.0–49.7)
MCH: 28 pg (ref 25.1–34.0)
MCHC: 32.5 g/dL (ref 31.5–36.0)
MCV: 86.2 fL (ref 79.5–101.0)
MONO%: 12.8 % (ref 0.0–14.0)
Platelets: 397 10*3/uL (ref 145–400)
RBC: 4.14 10*6/uL (ref 3.70–5.45)
nRBC: 0 % (ref 0–0)

## 2011-01-22 LAB — COMPREHENSIVE METABOLIC PANEL
ALT: 8 U/L (ref 0–35)
AST: 16 U/L (ref 0–37)
CO2: 24 mEq/L (ref 19–32)
Creatinine, Ser: 0.42 mg/dL — ABNORMAL LOW (ref 0.50–1.10)
Total Bilirubin: 0.3 mg/dL (ref 0.3–1.2)

## 2011-01-22 MED ORDER — ONDANSETRON 8 MG/50ML IVPB (CHCC)
8.0000 mg | Freq: Once | INTRAVENOUS | Status: AC
  Administered 2011-01-22: 8 mg via INTRAVENOUS

## 2011-01-22 MED ORDER — DEXAMETHASONE SODIUM PHOSPHATE 10 MG/ML IJ SOLN
10.0000 mg | Freq: Once | INTRAMUSCULAR | Status: AC
  Administered 2011-01-22: 10 mg via INTRAVENOUS

## 2011-01-22 MED ORDER — SODIUM CHLORIDE 0.9 % IJ SOLN
10.0000 mL | INTRAMUSCULAR | Status: DC | PRN
  Administered 2011-01-22: 10 mL
  Filled 2011-01-22: qty 10

## 2011-01-22 MED ORDER — SODIUM CHLORIDE 0.9 % IV SOLN
Freq: Once | INTRAVENOUS | Status: AC
  Administered 2011-01-22: 13:00:00 via INTRAVENOUS

## 2011-01-22 MED ORDER — HEPARIN SOD (PORK) LOCK FLUSH 100 UNIT/ML IV SOLN
500.0000 [IU] | Freq: Once | INTRAVENOUS | Status: AC | PRN
  Administered 2011-01-22: 500 [IU]
  Filled 2011-01-22: qty 5

## 2011-01-22 MED ORDER — SODIUM CHLORIDE 0.9 % IV SOLN
500.0000 mg/m2 | Freq: Once | INTRAVENOUS | Status: AC
  Administered 2011-01-22: 900 mg via INTRAVENOUS
  Filled 2011-01-22: qty 36

## 2011-01-22 NOTE — Patient Instructions (Signed)
Patient tolerated treatment without event. Patient aware of next appointment. Ambulatory to lobby without difficulty.

## 2011-01-22 NOTE — Telephone Encounter (Signed)
appt made for lab adrena and ts and printed for pt  aom

## 2011-01-22 NOTE — Progress Notes (Signed)
[facility] OFFICE PROGRESS NOTE  Danielle Nora, MD, MD [address] [place] [address]  DIAGNOSIS: Metastatic non-small cell lung cancer initially diagnosed with locally advanced stage IIIB with right Pancoast tumor involving the vertebral body as well as the foraminal canal invasion with spinal cord compression in October of 2007.   PRIOR THERAPY:  1. Status post concurrent chemoradiation with weekly carboplatin and paclitaxel, last dose was given November 18, 2005. 2. 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 3. Status post gamma knife radiotherapy to a solitary brain lesion located in the superior frontal area of the brain at [facility] in April of 2008. 4. Status post palliative radiotherapy to the lateral abdominal wall metastatic lesion under the care of Dr. Mitzi Hansen, completed March of 2009. 5. Status post 6 cycles of systemic chemotherapy with carboplatin and Alimta. Last dose was given July 26, 2007 with disease stabilization. CUCURRENT THERAPY: : Maintenance chemotherapy with Alimta 500 mg per meter squared given every 3 weeks. The patient is status post 59 cycles.   INTERVAL HISTORY: Danielle [name] 61 y.o. female returns to the clinic today for followup visit accompanied her sister. The patient her to the last cycle of her chemotherapy fairly well. She denied having any significant chest pain or shortness breath, no cough or hemoptysis, no weight loss or night sweats.   MEDICAL HISTORY: Past Medical History  Diagnosis Date  . Hypertension   . Anemia   . H/O: pneumonia   . History of tobacco abuse quit 10/08    on nicotine patch  . Fibromyalgia   . Hypokalemia   . DJD (degenerative joint disease), cervical   . 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 had chemo and radiation for lung ca  . Thrush 12/11/2010     ALLERGIES:  is allergic to contrast media; co-trimoxazole injection; iohexol; and sulfa drugs cross reactors.  MEDICATIONS:  Current Outpatient Prescriptions  Medication Sig Dispense Refill  . ALPRAZolam (XANAX) 0.25 MG tablet Take 1 tablet (0.25 mg total) by mouth at bedtime as needed.  30 tablet  0  . amLODipine (NORVASC) 10 MG tablet TAKE 1 TABLET EVERY DAY  90 tablet  1  . antipyrine-benzocaine (AURALGAN) otic solution Place 3 drops into both ears every 2 (two) hours as needed.        . fluconazole (DIFLUCAN) 100 MG tablet Take 2 tabs  Stat, then take 1 tab daily until completed  16 tablet  0  . folic acid (FOLVITE) 1 MG tablet Take 1 mg by mouth daily.        Marland Kitchen lisinopril (PRINIVIL,ZESTRIL) 5 MG tablet TAKE 1 TABLET BY MOUTH DAILY.  90 tablet  1  . morphine (MS CONTIN) 60 MG 12 hr tablet Take 1 tablet (60 mg total) by mouth 2 (two) times daily. Every 12 hours  60 tablet  0  . morphine (MSIR) 30 MG tablet Take 1 tablet (30 mg total) by mouth every 4 (four) hours as needed. Take 1 tab every 4-6 hours as needed for breakthrough pain  60 tablet  0  . PATANOL 0.1 % ophthalmic solution PUT 1 DROP IN AFFECTED EYE(S) 2X A DAY  5 mL  1  . prochlorperazine (COMPAZINE) 10 MG tablet Take 10 mg by mouth every 6 (six) hours as needed.        . simvastatin (ZOCOR)  40 MG tablet 1 TAB BY MOUTH DAILY  30 tablet  10  . traZODone (DESYREL) 50 MG tablet Take 50 mg by mouth at bedtime.          SURGICAL HISTORY:  Past Surgical History  Procedure Date  . Cervical laminectomy 1995  . Other surgical history 2008    Gamma knife surgery to remove brain met   . 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 Knee surgery 1990    Left x 2  . Portacath placement -[name]    TIP IN CAVOATRIAL JUNCTION    REVIEW OF SYSTEMS:  A comprehensive review of systems was negative.   PHYSICAL EXAMINATION: General appearance: alert, cooperative and no distress Head: Normocephalic, without  obvious abnormality, atraumatic Lymph nodes: Cervical, supraclavicular, and axillary nodes normal. Resp: clear to auscultation bilaterally Cardio: regular rate and rhythm, S1, S2 normal, no murmur, click, rub or gallop GI: soft, non-tender; bowel sounds normal; no masses,  no organomegaly Extremities: extremities normal, atraumatic, no cyanosis or edema  ECOG PERFORMANCE STATUS: 0 - Asymptomatic  Blood pressure 107/68, pulse 81, temperature 97.4 F (36.3 C), temperature source Oral, height 5\' 4"  (1.626 m), weight 157 lb 6.4 oz (71.396 kg).  LABORATORY DATA: Lab Results  Component Value Date   WBC 7.9 01/22/2011   HGB 11.6 01/22/2011   HCT 35.7 01/22/2011   MCV 86.2 01/22/2011   PLT 397 01/22/2011      Chemistry      Component Value Date/Time   NA 140 12/31/2010 0907   K 4.1 12/31/2010 0907   CL 107 12/31/2010 0907   CO2 24 12/31/2010 0907   BUN 18 12/31/2010 0907   CREATININE 0.58 12/31/2010 0907      Component Value Date/Time   CALCIUM 9.0 12/31/2010 0907   ALKPHOS 65 12/31/2010 0907   AST 19 12/31/2010 0907   ALT 11 12/31/2010 0907   BILITOT 0.4 12/31/2010 0907       RADIOGRAPHIC STUDIES: Ct Chest W Contrast  12/27/2010  *RADIOLOGY REPORT*  Clinical Data:  Restaging metastatic lung cancer.  Back pain.  CT CHEST, ABDOMEN AND PELVIS WITH CONTRAST  Technique:  Multidetector CT imaging of the chest, abdomen and pelvis was performed following the standard protocol during bolus administration of intravenous contrast.  Contrast: OMNIPAQUE IOHEXOL 300 MG/ML IV SOLN  Comparison:  10/26/2010.  CT CHEST  Findings:  No pathologically enlarged mediastinal, hilar or axillary lymph nodes.  There is coronary artery calcification. Heart size normal.  No pericardial effusion.  There is irregular soft tissue at the apex of the right hemithorax measuring approximately 2.1 x 1.4 cm, stable.  Emphysema. Subpleural density in the anterior right upper lobe (image 15) is unchanged.  No pleural  fluid.  Airway is unremarkable.  IMPRESSION: Subpleural soft tissue at the apex of the right hemithorax is grossly stable.  CT ABDOMEN AND PELVIS  Findings:  Liver, gallbladder, adrenal glands and right kidney are unremarkable.  A 6 mm low attenuation lesion in the lower pole left kidney is stable and likely a cyst.  Spleen, pancreas, stomach and small bowel are unremarkable.  Stool is seen throughout the colon.  Uterus and ovaries are visualized.  Atherosclerotic calcification of the arterial vasculature without aneurysm.  No pathologically enlarged lymph nodes.  No free fluid.  There are two thoracic vertebroplasties.  Untreated upper thoracic compression fracture (vertebral plana) is unchanged.  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  IMPRESSION:  1.  No evidence of metastatic disease in the abdomen or pelvis. 2.  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 3.  Constipation.  Original Report Authenticated By: Reyes Ivan, M.D.   Ct Abdomen Pelvis W Contrast  12/27/2010  *RADIOLOGY REPORT*  Clinical Data:  Restaging metastatic lung cancer.  Back pain.  CT CHEST, ABDOMEN AND PELVIS WITH CONTRAST  Technique:  Multidetector CT imaging of the chest, abdomen and pelvis was performed following the standard protocol during bolus administration of intravenous contrast.  Contrast: OMNIPAQUE IOHEXOL 300 MG/ML IV SOLN  Comparison:  10/26/2010.  CT CHEST  Findings:  No pathologically enlarged mediastinal, hilar or axillary lymph nodes.  There is coronary artery calcification. Heart size normal.  No pericardial effusion.  There is irregular soft tissue at the apex of the right hemithorax measuring approximately 2.1 x 1.4 cm, stable.  Emphysema. Subpleural density in the anterior right upper lobe (image 15) is unchanged.  No pleural fluid.  Airway is unremarkable.  IMPRESSION: Subpleural soft tissue at the apex of the right hemithorax is grossly  stable.  CT ABDOMEN AND PELVIS  Findings:  Liver, gallbladder, adrenal glands and right kidney are unremarkable.  A 6 mm low attenuation lesion in the lower pole left kidney is stable and likely a cyst.  Spleen, pancreas, stomach and small bowel are unremarkable.  Stool is seen throughout the colon.  Uterus and ovaries are visualized.  Atherosclerotic calcification of the arterial vasculature without aneurysm.  No pathologically enlarged lymph nodes.  No free fluid.  There are two thoracic vertebroplasties.  Untreated upper thoracic compression fracture (vertebral plana) is unchanged.  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  IMPRESSION:  1.  No evidence of metastatic disease in the abdomen or pelvis. 2.  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 3.  Constipation.  Original Report Authenticated By: Reyes Ivan, M.D.    ASSESSMENT: This is a very pleasant 61 years old white female with metastatic non-small cell lung cancer currently on maintenance chemotherapy with single agent Alimta status post 59 cycles. The patient is doing fine with no evidence for disease progression on the last scan.   PLAN: We'll continue on maintenance chemotherapy with Alimta as scheduled. The patient will receive cycle #60 today and she would come back for followup visit in 3 weeks. She was advised to call me immediately if she has any concerning symptoms in the interval.  All questions were answered. The patient knows to call the clinic with any problems, questions or concerns. We can certainly see the patient much sooner if necessary.

## 2011-01-25 ENCOUNTER — Ambulatory Visit: Payer: Medicare Other | Admitting: Family Medicine

## 2011-01-29 ENCOUNTER — Encounter: Payer: Self-pay | Admitting: Family Medicine

## 2011-01-29 ENCOUNTER — Ambulatory Visit (INDEPENDENT_AMBULATORY_CARE_PROVIDER_SITE_OTHER): Payer: Medicare Other | Admitting: Family Medicine

## 2011-01-29 DIAGNOSIS — C3490 Malignant neoplasm of unspecified part of unspecified bronchus or lung: Secondary | ICD-10-CM | POA: Diagnosis not present

## 2011-01-29 DIAGNOSIS — E7800 Pure hypercholesterolemia, unspecified: Secondary | ICD-10-CM | POA: Diagnosis not present

## 2011-01-29 DIAGNOSIS — I10 Essential (primary) hypertension: Secondary | ICD-10-CM

## 2011-01-29 DIAGNOSIS — E119 Type 2 diabetes mellitus without complications: Secondary | ICD-10-CM | POA: Diagnosis not present

## 2011-01-29 NOTE — Assessment & Plan Note (Signed)
Well controlled. Continue current medication.  

## 2011-01-29 NOTE — Patient Instructions (Signed)
We are so glad you are doing well!

## 2011-01-29 NOTE — Assessment & Plan Note (Signed)
Followed by Dr. Shirline Frees at [facility].

## 2011-01-29 NOTE — Assessment & Plan Note (Signed)
Well controlled with diet. Encouraged exercise as tolerated,and healthy eating habits.

## 2011-01-29 NOTE — Assessment & Plan Note (Addendum)
Well controlled. Continue current medication. Counseled on ways to increase HDL. Recheck in 1 year.

## 2011-01-29 NOTE — Progress Notes (Signed)
  Subjective:    Patient ID: Danielle [name], female    DOB: 12/04/1950, 61 y.o.   MRN: [id number]  HPI  61 year old female with:  Lung cancer, metastatic On 60 chemo cycle!!!.. MRI brain..with changes but was thought Not to represent tumor recurrence at this time. Sees ONC regularly. Chemo recieves every 3 weeks.  CT scan of lungs and abdomen/pelvis stable at last check.  Pain well controlled with morphine..some constipation..hard stool.  Sleeping well. Using alprazolam.   Diabetes: Well controlled with diet  Lab Results  Component Value Date   HGBA1C 6.3 01/10/2011   Using medications without difficulties:  Hypoglycemic episodes: None  Hyperglycemic episodes:None  Feet problems: None  Blood Sugars averaging: not checking at home.  eye exam within last year: No   Hypertension: Well controlled on amlodipine and lisinopril  Using medication without problems or lightheadedness:  Chest pain with exertion:None  Edema:Occ  Short of breath: Yes, but stable  Average home BPs: At goal.  Other issues:   Elevated Cholesterol: On zocor.  Using medications without problems:Yes  Muscle aches: None  Other complaints:      Review of Systems  Constitutional: Positive for fatigue. Negative for fever.  HENT: Negative for ear pain.   Eyes: Negative for pain.  Respiratory: Positive for shortness of breath. Negative for cough.   Cardiovascular: Positive for leg swelling. Negative for chest pain.       Improved now       Objective:   Physical Exam  Constitutional: Vital signs are normal. She appears well-developed and well-nourished. She is cooperative.  Non-toxic appearance. She does not appear ill. No distress.  HENT:  Head: Normocephalic.  Right Ear: Hearing, tympanic membrane, external ear and ear canal normal. Tympanic membrane is not erythematous, not retracted and not bulging.  Left Ear: Hearing, tympanic membrane, external ear and ear canal normal. Tympanic membrane is not  erythematous, not retracted and not bulging.  Nose: No mucosal edema or rhinorrhea. Right sinus exhibits no maxillary sinus tenderness and no frontal sinus tenderness. Left sinus exhibits no maxillary sinus tenderness and no frontal sinus tenderness.  Mouth/Throat: Uvula is midline, oropharynx is clear and moist and mucous membranes are normal.  Eyes: Conjunctivae, EOM and lids are normal. Pupils are equal, round, and reactive to light. No foreign bodies found.  Neck: Trachea normal and normal range of motion. Neck supple. Carotid bruit is not present. No mass and no thyromegaly present.  Cardiovascular: Normal rate, regular rhythm, S1 normal, S2 normal, normal heart sounds, intact distal pulses and normal pulses.  Exam reveals no gallop and no friction rub.   No murmur heard. Pulmonary/Chest: Effort normal. Not tachypneic. No respiratory distress. She has decreased breath sounds in the right upper field and the right middle field. She has no wheezes. She has no rhonchi. She has no rales.       Lung exam stable from previous Moist cough intermittantly  Abdominal: Soft. Normal appearance and bowel sounds are normal. There is no tenderness.  Neurological: She is alert.  Skin: Skin is warm, dry and intact. No rash noted.  Psychiatric: Her speech is normal and behavior is normal. Judgment and thought content normal. Her mood appears not anxious. Cognition and memory are normal. She does not exhibit a depressed mood.          Assessment & Plan:

## 2011-02-04 ENCOUNTER — Other Ambulatory Visit: Payer: Self-pay | Admitting: Internal Medicine

## 2011-02-04 DIAGNOSIS — C3410 Malignant neoplasm of upper lobe, unspecified bronchus or lung: Secondary | ICD-10-CM

## 2011-02-05 ENCOUNTER — Other Ambulatory Visit: Payer: Self-pay | Admitting: *Deleted

## 2011-02-05 DIAGNOSIS — B370 Candidal stomatitis: Secondary | ICD-10-CM

## 2011-02-05 DIAGNOSIS — C3490 Malignant neoplasm of unspecified part of unspecified bronchus or lung: Secondary | ICD-10-CM

## 2011-02-05 MED ORDER — MORPHINE SULFATE 30 MG PO TABS: 30.0000 mg | ORAL_TABLET | ORAL | Status: DC | PRN

## 2011-02-05 MED ORDER — ALPRAZOLAM 0.25 MG PO TABS: 0.2500 mg | ORAL_TABLET | Freq: Every evening | ORAL | Status: DC | PRN

## 2011-02-05 MED ORDER — MORPHINE SULFATE CR 60 MG PO TB12: 60.0000 mg | ORAL_TABLET | Freq: Two times a day (BID) | ORAL | Status: DC

## 2011-02-12 ENCOUNTER — Ambulatory Visit (HOSPITAL_BASED_OUTPATIENT_CLINIC_OR_DEPARTMENT_OTHER): Payer: Medicare Other

## 2011-02-12 ENCOUNTER — Ambulatory Visit (HOSPITAL_BASED_OUTPATIENT_CLINIC_OR_DEPARTMENT_OTHER): Payer: Medicare Other | Admitting: Physician Assistant

## 2011-02-12 ENCOUNTER — Other Ambulatory Visit (HOSPITAL_BASED_OUTPATIENT_CLINIC_OR_DEPARTMENT_OTHER): Payer: Medicare Other

## 2011-02-12 ENCOUNTER — Encounter: Payer: Self-pay | Admitting: Physician Assistant

## 2011-02-12 VITALS — BP 122/70 | HR 86 | Temp 96.7°F | Ht 63.75 in | Wt 156.9 lb

## 2011-02-12 DIAGNOSIS — C7952 Secondary malignant neoplasm of bone marrow: Secondary | ICD-10-CM | POA: Diagnosis not present

## 2011-02-12 DIAGNOSIS — C3410 Malignant neoplasm of upper lobe, unspecified bronchus or lung: Secondary | ICD-10-CM

## 2011-02-12 DIAGNOSIS — C3490 Malignant neoplasm of unspecified part of unspecified bronchus or lung: Secondary | ICD-10-CM

## 2011-02-12 DIAGNOSIS — C7951 Secondary malignant neoplasm of bone: Secondary | ICD-10-CM | POA: Diagnosis not present

## 2011-02-12 DIAGNOSIS — B370 Candidal stomatitis: Secondary | ICD-10-CM

## 2011-02-12 DIAGNOSIS — Z23 Encounter for immunization: Secondary | ICD-10-CM

## 2011-02-12 DIAGNOSIS — Z5111 Encounter for antineoplastic chemotherapy: Secondary | ICD-10-CM

## 2011-02-12 DIAGNOSIS — C7949 Secondary malignant neoplasm of other parts of nervous system: Secondary | ICD-10-CM | POA: Diagnosis not present

## 2011-02-12 DIAGNOSIS — C7931 Secondary malignant neoplasm of brain: Secondary | ICD-10-CM

## 2011-02-12 LAB — CBC WITH DIFFERENTIAL/PLATELET
BASO%: 1.4 % (ref 0.0–2.0)
HCT: 35.5 % (ref 34.8–46.6)
LYMPH%: 25 % (ref 14.0–49.7)
MCHC: 34.2 g/dL (ref 31.5–36.0)
MONO#: 1.2 10*3/uL — ABNORMAL HIGH (ref 0.1–0.9)
NEUT%: 61.3 % (ref 38.4–76.8)
Platelets: 344 10*3/uL (ref 145–400)
WBC: 10 10*3/uL (ref 3.9–10.3)

## 2011-02-12 LAB — COMPREHENSIVE METABOLIC PANEL
ALT: 12 U/L (ref 0–35)
BUN: 10 mg/dL (ref 6–23)
CO2: 24 mEq/L (ref 19–32)
Creatinine, Ser: 0.56 mg/dL (ref 0.50–1.10)
Glucose, Bld: 106 mg/dL — ABNORMAL HIGH (ref 70–99)
Total Bilirubin: 0.4 mg/dL (ref 0.3–1.2)

## 2011-02-12 MED ORDER — CYANOCOBALAMIN 1000 MCG/ML IJ SOLN
1000.0000 ug | Freq: Once | INTRAMUSCULAR | Status: AC
  Administered 2011-02-12: 1000 ug via INTRAMUSCULAR

## 2011-02-12 MED ORDER — SODIUM CHLORIDE 0.9 % IV SOLN
Freq: Once | INTRAVENOUS | Status: AC
  Administered 2011-02-12: 10:00:00 via INTRAVENOUS

## 2011-02-12 MED ORDER — SODIUM CHLORIDE 0.9 % IV SOLN
500.0000 mg/m2 | Freq: Once | INTRAVENOUS | Status: AC
  Administered 2011-02-12: 900 mg via INTRAVENOUS
  Filled 2011-02-12: qty 36

## 2011-02-12 MED ORDER — DEXAMETHASONE SODIUM PHOSPHATE 10 MG/ML IJ SOLN
10.0000 mg | Freq: Once | INTRAMUSCULAR | Status: AC
  Administered 2011-02-12: 10 mg via INTRAVENOUS

## 2011-02-12 MED ORDER — SODIUM CHLORIDE 0.9 % IJ SOLN
10.0000 mL | INTRAMUSCULAR | Status: DC | PRN
  Administered 2011-02-12: 10 mL
  Filled 2011-02-12: qty 10

## 2011-02-12 MED ORDER — MORPHINE SULFATE 30 MG PO TABS: 30.0000 mg | ORAL_TABLET | ORAL | Status: DC | PRN

## 2011-02-12 MED ORDER — HEPARIN SOD (PORK) LOCK FLUSH 100 UNIT/ML IV SOLN
500.0000 [IU] | Freq: Once | INTRAVENOUS | Status: AC | PRN
  Administered 2011-02-12: 500 [IU]
  Filled 2011-02-12: qty 5

## 2011-02-12 MED ORDER — ONDANSETRON 8 MG/50ML IVPB (CHCC)
8.0000 mg | Freq: Once | INTRAVENOUS | Status: AC
Start: 2011-02-12 — End: 2011-02-12
  Administered 2011-02-12: 8 mg via INTRAVENOUS

## 2011-02-12 MED ORDER — PNEUMOCOCCAL VAC POLYVALENT 25 MCG/0.5ML IJ INJ
0.5000 mL | INJECTION | Freq: Once | INTRAMUSCULAR | Status: AC
  Administered 2011-02-12: 0.5 mL via INTRAMUSCULAR
  Filled 2011-02-12: qty 0.5

## 2011-02-12 NOTE — Progress Notes (Signed)
[facility] OFFICE PROGRESS NOTE  [name], MD, MD [address] [place] [address]  DIAGNOSIS: Metastatic non-small cell lung cancer initially diagnosed with locally advanced stage IIIB with right Pancoast tumor involving the vertebral body as well as the foraminal canal invasion with spinal cord compression in October of 2007.   PRIOR THERAPY:  1. Status post concurrent chemoradiation with weekly carboplatin and paclitaxel, last dose was given November 18, 2005. 2. 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 3. Status post gamma knife radiotherapy to a solitary brain lesion located in the superior frontal area of the brain at [facility] in April of 2008. 4. Status post palliative radiotherapy to the lateral abdominal wall metastatic lesion under the care of Dr. Mitzi Hansen, completed March of 2009. 5. Status post 6 cycles of systemic chemotherapy with carboplatin and Alimta. Last dose was given July 26, 2007 with disease stabilization.   CUCURRENT THERAPY: : Maintenance chemotherapy with Alimta 500 mg per meter squared given every 3 weeks. The patient is status post 60 cycles.   INTERVAL HISTORY: Danielle [name] 61 y.o. female returns to the clinic today for followup visit accompanied her sister. She continues to tolerate her maintenance chemotherapy without difficulty. She denied having any significant chest pain or shortness breath, no cough or hemoptysis, no weight loss or night sweats. She requests a refill for her immediate release morphine. Her last prescription was written for 30 tablets instead of her usual 60 tablets. She recently saw her primary care physician who told her that she does not have diabetes however they will continue to monitor her blood sugars closely.  MEDICAL HISTORY: Past Medical History  Diagnosis Date  . Hypertension   . Anemia   .  H/O: pneumonia   . History of tobacco abuse quit 10/08    on nicotine patch  . Fibromyalgia   . Hypokalemia   . DJD (degenerative joint disease), cervical   . 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 had chemo and radiation for lung ca  . Thrush 12/11/2010    ALLERGIES:  is allergic to contrast media; co-trimoxazole injection; iohexol; and sulfa drugs cross reactors.  MEDICATIONS:  Current Outpatient Prescriptions  Medication Sig Dispense Refill  . ALPRAZolam (XANAX) 0.25 MG tablet Take 1 tablet (0.25 mg total) by mouth at bedtime as needed.  30 tablet  0  . amLODipine (NORVASC) 10 MG tablet TAKE 1 TABLET EVERY DAY  90 tablet  1  . antipyrine-benzocaine (AURALGAN) otic solution Place 3 drops into both ears every 2 (two) hours as needed.        . fluconazole (DIFLUCAN) 100 MG tablet Take 2 tabs  Stat, then take 1 tab daily until completed  16 tablet  0  . folic acid (FOLVITE) 1 MG tablet TAKE 1 TABLET BY MOUTH EVERY DAY  30 tablet  2  . lisinopril (PRINIVIL,ZESTRIL) 5 MG tablet TAKE 1 TABLET BY MOUTH DAILY.  90 tablet  1  . morphine (MS CONTIN) 60 MG 12 hr tablet Take 1 tablet (60 mg total) by mouth 2 (two) times daily. Every 12 hours  60 tablet  0  . morphine (MSIR) 30 MG tablet Take 1 tablet (30 mg total) by mouth every 4 (four) hours as needed. Take 1 tab every 4-6 hours as needed for breakthrough pain  30 tablet  0  .  PATANOL 0.1 % ophthalmic solution PUT 1 DROP IN AFFECTED EYE(S) 2X A DAY  5 mL  1  . prochlorperazine (COMPAZINE) 10 MG tablet Take 10 mg by mouth every 6 (six) hours as needed.        . simvastatin (ZOCOR) 40 MG tablet 1 TAB BY MOUTH DAILY  30 tablet  10  . traZODone (DESYREL) 50 MG tablet Take 50 mg by mouth at bedtime.         Current Facility-Administered Medications  Medication Dose Route Frequency Provider Last Rate Last Dose  . pneumococcal 23 valent vaccine (PNU-IMMUNE) injection 0.5 mL  0.5 mL Intramuscular Once Tiana Loft, PA        Facility-Administered Medications Ordered in Other Visits  Medication Dose Route Frequency Provider Last Rate Last Dose  . 0.9 %  sodium chloride infusion   Intravenous Once Mohamed K. Mohamed, MD      . cyanocobalamin ((VITAMIN B-12)) injection 1,000 mcg  1,000 mcg Intramuscular Once Mohamed K. Mohamed, MD   1,000 mcg at 02/12/11 1036  . dexamethasone (DECADRON) injection 10 mg  10 mg Intravenous Once Mohamed K. Mohamed, MD   10 mg at 02/12/11 1026  . heparin lock flush 100 unit/mL  500 Units Intracatheter Once PRN Mohamed K. Mohamed, MD      . ondansetron (ZOFRAN) IVPB 8 mg  8 mg Intravenous Once Mohamed K. Mohamed, MD   8 mg at 02/12/11 1026  . PEMEtrexed (ALIMTA) 900 mg in sodium chloride 0.9 % 100 mL chemo infusion  500 mg/m2 (Treatment Plan Actual) Intravenous Once Mohamed K. Mohamed, MD      . sodium chloride 0.9 % injection 10 mL  10 mL Intracatheter PRN Mohamed K. Arbutus Ped, MD        SURGICAL HISTORY:  Past Surgical History  Procedure Date  . Cervical laminectomy 1995  . Other surgical history 2008    Gamma knife surgery to remove brain met   . 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 Knee surgery 1990    Left x 2  . Portacath placement -[name]    TIP IN CAVOATRIAL JUNCTION    REVIEW OF SYSTEMS:  A comprehensive review of systems was negative.   PHYSICAL EXAMINATION: General appearance: alert, cooperative and no distress Head: Normocephalic, without obvious abnormality, atraumatic Lymph nodes: Cervical, supraclavicular, and axillary nodes normal. Resp: clear to auscultation bilaterally Cardio: regular rate and rhythm, S1, S2 normal, no murmur, click, rub or gallop GI: soft, non-tender; bowel sounds normal; no masses,  no organomegaly Extremities: extremities normal, atraumatic, no cyanosis or edema  ECOG PERFORMANCE STATUS: 0 - Asymptomatic  Blood pressure 122/70, pulse 86, temperature 96.7 F (35.9 C), temperature source Oral, height  5' 3.75" (1.619 m), weight 156 lb 14.4 oz (71.169 kg).  LABORATORY DATA: Lab Results  Component Value Date   WBC 10.0 02/12/2011   HGB 12.1 02/12/2011   HCT 35.5 02/12/2011   MCV 86.1 02/12/2011   PLT 344 02/12/2011      Chemistry      Component Value Date/Time   NA 142 01/22/2011 1100   K 4.0 01/22/2011 1100   CL 104 01/22/2011 1100   CO2 24 01/22/2011 1100   BUN 10 01/22/2011 1100   CREATININE 0.42* 01/22/2011 1100      Component Value Date/Time   CALCIUM 9.1 01/22/2011 1100   ALKPHOS 77 01/22/2011 1100   AST 16 01/22/2011 1100   ALT 8 01/22/2011 1100  BILITOT 0.3 01/22/2011 1100       RADIOGRAPHIC STUDIES: Ct Chest W Contrast  12/27/2010  *RADIOLOGY REPORT*  Clinical Data:  Restaging metastatic lung cancer.  Back pain.  CT CHEST, ABDOMEN AND PELVIS WITH CONTRAST  Technique:  Multidetector CT imaging of the chest, abdomen and pelvis was performed following the standard protocol during bolus administration of intravenous contrast.  Contrast: OMNIPAQUE IOHEXOL 300 MG/ML IV SOLN  Comparison:  10/26/2010.  CT CHEST  Findings:  No pathologically enlarged mediastinal, hilar or axillary lymph nodes.  There is coronary artery calcification. Heart size normal.  No pericardial effusion.  There is irregular soft tissue at the apex of the right hemithorax measuring approximately 2.1 x 1.4 cm, stable.  Emphysema. Subpleural density in the anterior right upper lobe (image 15) is unchanged.  No pleural fluid.  Airway is unremarkable.  IMPRESSION: Subpleural soft tissue at the apex of the right hemithorax is grossly stable.  CT ABDOMEN AND PELVIS  Findings:  Liver, gallbladder, adrenal glands and right kidney are unremarkable.  A 6 mm low attenuation lesion in the lower pole left kidney is stable and likely a cyst.  Spleen, pancreas, stomach and small bowel are unremarkable.  Stool is seen throughout the colon.  Uterus and ovaries are visualized.  Atherosclerotic calcification of the arterial vasculature  without aneurysm.  No pathologically enlarged lymph nodes.  No free fluid.  There are two thoracic vertebroplasties.  Untreated upper thoracic compression fracture (vertebral plana) is unchanged.  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  IMPRESSION:  1.  No evidence of metastatic disease in the abdomen or pelvis. 2.  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 3.  Constipation.  Original Report Authenticated By: Reyes Ivan, M.D.   Ct Abdomen Pelvis W Contrast  12/27/2010  *RADIOLOGY REPORT*  Clinical Data:  Restaging metastatic lung cancer.  Back pain.  CT CHEST, ABDOMEN AND PELVIS WITH CONTRAST  Technique:  Multidetector CT imaging of the chest, abdomen and pelvis was performed following the standard protocol during bolus administration of intravenous contrast.  Contrast: OMNIPAQUE IOHEXOL 300 MG/ML IV SOLN  Comparison:  10/26/2010.  CT CHEST  Findings:  No pathologically enlarged mediastinal, hilar or axillary lymph nodes.  There is coronary artery calcification. Heart size normal.  No pericardial effusion.  There is irregular soft tissue at the apex of the right hemithorax measuring approximately 2.1 x 1.4 cm, stable.  Emphysema. Subpleural density in the anterior right upper lobe (image 15) is unchanged.  No pleural fluid.  Airway is unremarkable.  IMPRESSION: Subpleural soft tissue at the apex of the right hemithorax is grossly stable.  CT ABDOMEN AND PELVIS  Findings:  Liver, gallbladder, adrenal glands and right kidney are unremarkable.  A 6 mm low attenuation lesion in the lower pole left kidney is stable and likely a cyst.  Spleen, pancreas, stomach and small bowel are unremarkable.  Stool is seen throughout the colon.  Uterus and ovaries are visualized.  Atherosclerotic calcification of the arterial vasculature without aneurysm.  No pathologically enlarged lymph nodes.  No free fluid.  There are two thoracic vertebroplasties.   Untreated upper thoracic compression fracture (vertebral plana) is unchanged.  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  IMPRESSION:  1.  No evidence of metastatic disease in the abdomen or pelvis. 2.  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 3.  Constipation.  Original  Report Authenticated By: Reyes Ivan, M.D.    ASSESSMENT/PLAN: This is a very pleasant 61 years old white female with metastatic non-small cell lung cancer currently on maintenance chemotherapy with single agent Alimta status post 59 cycles. She currently has stable disease with no evidence for disease progression on her most recent restaging CT scan. Patient was discussed with Dr. Arbutus Ped. She will continue on her maintenance chemotherapy with single agent Alimta. She no longer takes the dexamethasone the day before the day of and day after chemotherapy and has not had difficulty with the Alimta. She'll proceed with cycle #60 one today. She'll followup with Dr. Arbutus Ped in 3 weeks with repeat CBC differential C. met and CT of the chest abdomen and pelvis with contrast to reevaluate her disease. She artery has the prednisone and Benadryl at home to take for premedication for her contrast allergy.   Laural Benes, Nashay Brickley E, PA-C   All questions were answered. The patient knows to call the clinic with any problems, questions or concerns. We can certainly see the patient much sooner if necessary.

## 2011-03-01 ENCOUNTER — Other Ambulatory Visit (HOSPITAL_COMMUNITY): Payer: Medicare Other

## 2011-03-01 ENCOUNTER — Ambulatory Visit (HOSPITAL_COMMUNITY)
Admission: RE | Admit: 2011-03-01 | Discharge: 2011-03-01 | Disposition: A | Discharge status: Home / Self Care | Payer: Medicare Other | Source: Ambulatory Visit | Attending: Physician Assistant | Admitting: Physician Assistant

## 2011-03-01 ENCOUNTER — Encounter (HOSPITAL_COMMUNITY): Payer: Self-pay

## 2011-03-01 DIAGNOSIS — C7931 Secondary malignant neoplasm of brain: Secondary | ICD-10-CM | POA: Insufficient documentation

## 2011-03-01 DIAGNOSIS — N289 Disorder of kidney and ureter, unspecified: Secondary | ICD-10-CM | POA: Diagnosis not present

## 2011-03-01 DIAGNOSIS — C7949 Secondary malignant neoplasm of other parts of nervous system: Secondary | ICD-10-CM | POA: Insufficient documentation

## 2011-03-01 DIAGNOSIS — J438 Other emphysema: Secondary | ICD-10-CM | POA: Diagnosis not present

## 2011-03-01 DIAGNOSIS — Z85118 Personal history of other malignant neoplasm of bronchus and lung: Secondary | ICD-10-CM | POA: Diagnosis not present

## 2011-03-01 DIAGNOSIS — C3490 Malignant neoplasm of unspecified part of unspecified bronchus or lung: Secondary | ICD-10-CM | POA: Diagnosis not present

## 2011-03-01 DIAGNOSIS — I700 Atherosclerosis of aorta: Secondary | ICD-10-CM | POA: Insufficient documentation

## 2011-03-01 DIAGNOSIS — C7951 Secondary malignant neoplasm of bone: Secondary | ICD-10-CM | POA: Diagnosis not present

## 2011-03-01 DIAGNOSIS — I2510 Atherosclerotic heart disease of native coronary artery without angina pectoris: Secondary | ICD-10-CM | POA: Insufficient documentation

## 2011-03-01 DIAGNOSIS — C786 Secondary malignant neoplasm of retroperitoneum and peritoneum: Secondary | ICD-10-CM | POA: Insufficient documentation

## 2011-03-01 DIAGNOSIS — Z79899 Other long term (current) drug therapy: Secondary | ICD-10-CM | POA: Diagnosis not present

## 2011-03-01 MED ORDER — IOHEXOL 300 MG/ML  SOLN
100.0000 mL | Freq: Once | INTRAMUSCULAR | Status: AC | PRN
  Administered 2011-03-01: 100 mL via INTRAVENOUS

## 2011-03-05 ENCOUNTER — Other Ambulatory Visit: Payer: Self-pay | Admitting: Family Medicine

## 2011-03-05 ENCOUNTER — Ambulatory Visit (HOSPITAL_BASED_OUTPATIENT_CLINIC_OR_DEPARTMENT_OTHER): Payer: Medicare Other

## 2011-03-05 ENCOUNTER — Other Ambulatory Visit: Payer: Self-pay | Admitting: Internal Medicine

## 2011-03-05 ENCOUNTER — Ambulatory Visit: Payer: Medicare Other | Admitting: Internal Medicine

## 2011-03-05 ENCOUNTER — Other Ambulatory Visit: Payer: Medicare Other | Admitting: Lab

## 2011-03-05 VITALS — BP 133/75 | HR 85 | Temp 98.0°F

## 2011-03-05 DIAGNOSIS — C3490 Malignant neoplasm of unspecified part of unspecified bronchus or lung: Secondary | ICD-10-CM

## 2011-03-05 DIAGNOSIS — Z5111 Encounter for antineoplastic chemotherapy: Secondary | ICD-10-CM | POA: Diagnosis not present

## 2011-03-05 LAB — CBC WITH DIFFERENTIAL/PLATELET
BASO%: 0.4 % (ref 0.0–2.0)
EOS%: 0.8 % (ref 0.0–7.0)
HCT: 38 % (ref 34.8–46.6)
HGB: 12.6 g/dL (ref 11.6–15.9)
MCH: 28.7 pg (ref 25.1–34.0)
MCHC: 33.2 g/dL (ref 31.5–36.0)
MONO#: 1 10*3/uL — ABNORMAL HIGH (ref 0.1–0.9)
NEUT%: 57.3 % (ref 38.4–76.8)
RDW: 15.6 % — ABNORMAL HIGH (ref 11.2–14.5)
WBC: 8.3 10*3/uL (ref 3.9–10.3)
lymph#: 2.4 10*3/uL (ref 0.9–3.3)

## 2011-03-05 LAB — COMPREHENSIVE METABOLIC PANEL
ALT: 16 U/L (ref 0–35)
AST: 18 U/L (ref 0–37)
Albumin: 3.7 g/dL (ref 3.5–5.2)
Alkaline Phosphatase: 78 U/L (ref 39–117)
BUN: 9 mg/dL (ref 6–23)
Calcium: 8.9 mg/dL (ref 8.4–10.5)
Chloride: 106 mEq/L (ref 96–112)
Potassium: 3.9 mEq/L (ref 3.5–5.3)
Sodium: 141 mEq/L (ref 135–145)
Total Protein: 5.9 g/dL — ABNORMAL LOW (ref 6.0–8.3)

## 2011-03-05 MED ORDER — MORPHINE SULFATE CR 60 MG PO TB12: 60.0000 mg | ORAL_TABLET | Freq: Two times a day (BID) | ORAL | Status: DC

## 2011-03-05 MED ORDER — ONDANSETRON 8 MG/50ML IVPB (CHCC)
8.0000 mg | Freq: Once | INTRAVENOUS | Status: AC
  Administered 2011-03-05: 8 mg via INTRAVENOUS

## 2011-03-05 MED ORDER — MORPHINE SULFATE 30 MG PO TABS: 30.0000 mg | ORAL_TABLET | ORAL | Status: DC | PRN

## 2011-03-05 MED ORDER — SODIUM CHLORIDE 0.9 % IV SOLN
500.0000 mg/m2 | Freq: Once | INTRAVENOUS | Status: AC
  Administered 2011-03-05: 900 mg via INTRAVENOUS
  Filled 2011-03-05: qty 36

## 2011-03-05 MED ORDER — ONDANSETRON HCL 8 MG PO TABS: 8.0000 mg | ORAL_TABLET | Freq: Three times a day (TID) | ORAL | Status: DC | PRN

## 2011-03-05 MED ORDER — SODIUM CHLORIDE 0.9 % IJ SOLN
10.0000 mL | INTRAMUSCULAR | Status: DC | PRN
  Administered 2011-03-05: 10 mL
  Filled 2011-03-05: qty 10

## 2011-03-05 MED ORDER — DEXAMETHASONE SODIUM PHOSPHATE 10 MG/ML IJ SOLN
10.0000 mg | Freq: Once | INTRAMUSCULAR | Status: AC
  Administered 2011-03-05: 10 mg via INTRAVENOUS

## 2011-03-05 MED ORDER — ALPRAZOLAM 0.25 MG PO TABS: 0.2500 mg | ORAL_TABLET | Freq: Every evening | ORAL | Status: DC | PRN

## 2011-03-05 MED ORDER — SODIUM CHLORIDE 0.9 % IV SOLN
Freq: Once | INTRAVENOUS | Status: AC
  Administered 2011-03-05: 10:00:00 via INTRAVENOUS

## 2011-03-05 MED ORDER — HEPARIN SOD (PORK) LOCK FLUSH 100 UNIT/ML IV SOLN
500.0000 [IU] | Freq: Once | INTRAVENOUS | Status: AC | PRN
  Administered 2011-03-05: 500 [IU]
  Filled 2011-03-05: qty 5

## 2011-03-05 NOTE — Progress Notes (Signed)
[facility] [phone]   [phone]  OFFICE PROGRESS NOTE  [name], MD, MD [address] [place] [address]  DIAGNOSIS: Metastatic non-small cell lung cancer initially diagnosed with locally advanced stage IIIB with right Pancoast tumor involving the vertebral body as well as the foraminal canal invasion with spinal cord compression in October of 2007.   PRIOR THERAPY:  1. Status post concurrent chemoradiation with weekly carboplatin and paclitaxel, last dose was given November 18, 2005. 2. 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 3. Status post gamma knife radiotherapy to a solitary brain lesion located in the superior frontal area of the brain at [facility] in April of 2008. 4. Status post palliative radiotherapy to the lateral abdominal wall metastatic lesion under the care of Dr. Mitzi Hansen, completed March of 2009. 5. Status post 6 cycles of systemic chemotherapy with carboplatin and Alimta. Last dose was given July 26, 2007 with disease stabilization. CURRENT THERAPY: : Maintenance chemotherapy with Alimta 500 mg per meter squared given every 3 weeks. The patient is status post 61 cycles.   INTERVAL HISTORY: Danielle [name] 61 y.o. female returns to the clinic today for follow up visit accompanied her sister. The patient has no complaints today. She denied having any significant nausea or vomiting. She has no chest pain or shortness of breath. She tolerated the last cycle of her chemotherapy fairly well except for occasional nausea. She is currently on Zofran on an as-needed basis for nausea. She has repeat CT scan of the chest, abdomen and pelvis performed recently and she is here today for evaluation and discussion of her scan results.  MEDICAL HISTORY: Past Medical History  Diagnosis Date  . Hypertension   . Anemia   .  H/O: pneumonia   . History of tobacco abuse quit 10/08    on nicotine patch  . Fibromyalgia   . Hypokalemia   . DJD (degenerative joint disease), cervical   . Thrush 12/11/2010  . 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 had chemo and radiation for lung ca    ALLERGIES:  is allergic to contrast media; co-trimoxazole injection; iohexol; and sulfa drugs cross reactors.  MEDICATIONS:  Current Outpatient Prescriptions  Medication Sig Dispense Refill  . amLODipine (NORVASC) 10 MG tablet TAKE 1 TABLET EVERY DAY  90 tablet  1  . antipyrine-benzocaine (AURALGAN) otic solution Place 3 drops into both ears every 2 (two) hours as needed.        . folic acid (FOLVITE) 1 MG tablet TAKE 1 TABLET BY MOUTH EVERY DAY  30 tablet  2  . lisinopril (PRINIVIL,ZESTRIL) 5 MG tablet TAKE 1 TABLET BY MOUTH DAILY.  90 tablet  1  . PATANOL 0.1 % ophthalmic solution PUT 1 DROP IN AFFECTED EYE(S) 2X A DAY  5 mL  1  . prochlorperazine (COMPAZINE) 10 MG tablet Take 10 mg by mouth every 6 (six) hours as needed.        . simvastatin (ZOCOR) 40 MG tablet 1 TAB BY MOUTH DAILY  30 tablet  10  . ALPRAZolam (XANAX) 0.25 MG tablet Take 1 tablet (0.25 mg total) by mouth at bedtime as needed.  30 tablet  0  . morphine (MS CONTIN) 60 MG 12 hr tablet Take 1 tablet (60 mg total) by mouth 2 (two) times daily. Every 12 hours  60 tablet  0  . morphine (MSIR) 30 MG tablet Take 1 tablet (30 mg total) by mouth every 4 (four) hours as needed. Take 1 tab every 4-6 hours as needed for breakthrough pain  60 tablet  0  . ondansetron (ZOFRAN) 8 MG tablet Take 1 tablet (8 mg total) by mouth every 8 (eight) hours as needed for nausea.  20 tablet  0   No current facility-administered medications for this visit.   Facility-Administered Medications Ordered in Other Visits  Medication Dose Route Frequency Provider Last Rate Last Dose  . 0.9 %  sodium chloride infusion   Intravenous Once Sriya Kroeze K. Yaman Grauberger, MD      .  dexamethasone (DECADRON) injection 10 mg  10 mg Intravenous Once Lenetta Piche K. Lekendrick Alpern, MD   10 mg at 03/05/11 1014  . heparin lock flush 100 unit/mL  500 Units Intracatheter Once PRN Carylon Tamburro K. Elyssa Pendelton, MD   500 Units at 03/05/11 1059  . ondansetron (ZOFRAN) IVPB 8 mg  8 mg Intravenous Once Domingo Fuson K. Felicita Nuncio, MD   8 mg at 03/05/11 1014  . PEMEtrexed (ALIMTA) 900 mg in sodium chloride 0.9 % 100 mL chemo infusion  500 mg/m2 (Treatment Plan Actual) Intravenous Once Markelle Asaro K. Maksym Pfiffner, MD   900 mg at 03/05/11 1043  . sodium chloride 0.9 % injection 10 mL  10 mL Intracatheter PRN Jasaun Carn K. Maryjane Benedict, MD   10 mL at 03/05/11 1059    SURGICAL HISTORY:  Past Surgical History  Procedure Date  . Cervical laminectomy 1995  . Other surgical history 2008    Gamma knife surgery to remove brain met   . 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 Knee surgery 1990    Left x 2  . Portacath placement -[name]    TIP IN CAVOATRIAL JUNCTION    REVIEW OF SYSTEMS:  A comprehensive review of systems was negative.   PHYSICAL EXAMINATION: General appearance: alert, cooperative and no distress Neck: no adenopathy Lymph nodes: Cervical, supraclavicular, and axillary nodes normal. Resp: clear to auscultation bilaterally Cardio: regular rate and rhythm, S1, S2 normal, no murmur, click, rub or gallop GI: soft, non-tender; bowel sounds normal; no masses,  no organomegaly Extremities: extremities normal, atraumatic, no cyanosis or edema Neurologic: Alert and oriented X 3, normal strength and tone. Normal symmetric reflexes. Normal coordination and gait  ECOG PERFORMANCE STATUS: 1 - Symptomatic but completely ambulatory  Blood pressure 133/75, pulse 85, temperature 98 F (36.7 C), temperature source Oral.  LABORATORY DATA: Lab Results  Component Value Date   WBC 8.3 03/05/2011   HGB 12.6 03/05/2011   HCT 38.0 03/05/2011   MCV 86.6 03/05/2011   PLT 271 03/05/2011      Chemistry        Component Value Date/Time   NA 139 02/12/2011 0900   K 4.1 02/12/2011 0900   CL 104 02/12/2011 0900   CO2 24 02/12/2011 0900   BUN 10 02/12/2011 0900   CREATININE 0.56 02/12/2011 0900      Component Value Date/Time   CALCIUM 9.3 02/12/2011 0900   ALKPHOS 79 02/12/2011 0900   AST 16 02/12/2011 0900   ALT 12 02/12/2011 0900   BILITOT 0.4 02/12/2011 0900       RADIOGRAPHIC STUDIES: Ct Chest W Contrast  03/01/2011  *RADIOLOGY REPORT*  Clinical Data:  Lung cancer with metastases to the spine, brain and omentum.  Chemotherapy underlying.  History of gamma knife therapy in October  2008.  Radiation therapy to the left groin in March 2009.  CT CHEST, ABDOMEN AND PELVIS WITH CONTRAST  Technique:  Multidetector CT imaging of the chest, abdomen and pelvis was performed following the standard protocol during bolus administration of intravenous contrast.  Contrast: OMNIPAQUE IOHEXOL 300 MG/ML IV SOLN  Comparison:  CT of chest abdomen and pelvis 12/27/2010.   CT CHEST  Findings:  Mediastinum: Right internal jugular single lumen Port-A-Cath with tip terminating in the right atrium. Heart size is normal. There is no significant pericardial fluid, thickening or pericardial calcification. No pathologically enlarged mediastinal or hilar lymph nodes. No acute abnormality of the thoracic aorta; specifically, no aneurysm or dissection. There is atherosclerosis of the thoracic aorta, the great vessels of the mediastinum and the coronary arteries, including calcified atherosclerotic plaque in the left main, left anterior descending and left circumflex coronary arteries. Esophagus is unremarkable in appearance.  Lungs/Pleura: Pleural-based 2.1 x 1.4 cm nodular opacity in the right apex (image 12 of series 5) is unchanged over numerous prior examinations.  There are otherwise no new or enlarging suspicious appearing pulmonary nodules or masses identified on today's CT examination. Surrounding postradiation changes in the adjacent right  upper lobe are again noted.  Focal area of pleural retraction in the anterior aspect of the right upper lobe is also unchanged. Lungs otherwise appear clear. Background of mild-moderate centrilobular emphysema and mild diffuse bronchial wall thickening unchanged.  No pleural effusions.  Musculoskeletal: There are no aggressive appearing lytic or blastic lesions noted in the visualized portions of the skeleton.  Post procedural changes of vertebroplasty at T4 and T7 are again noted. Vertebral plana at T3 is unchanged.  IMPRESSION:  1.  Findings in the thorax are similar to numerous prior examinations, as detailed above.  No findings to suggest recurrence of disease or new metastatic disease in the thorax. 2.  Vertebral plana at T3 (unchanged).  3. 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 Please note that although the presence of coronary artery calcium documents the presence of coronary artery disease, the severity of this disease and any potential stenosis cannot be assessed on this non-gated CT examination.  Assessment for potential risk factor modification, dietary therapy or pharmacologic therapy may be warranted, if clinically indicated. 4.  Mild-moderate centrilobular emphysema and mild diffuse bronchial wall thickening again noted.   CT ABDOMEN AND PELVIS  Findings:  Abdomen/Pelvis: The infused appearance of the liver, gallbladder, pancreas, spleen, bilateral adrenal glands and right kidney is unremarkable.  6 mm low attenuation lesion in the lower pole of left kidney is too small to characterize, but is unchanged over numerous prior examinations and is favored to represent a small benign lesion such as a cyst.  Extensive atherosclerosis of the abdominal and pelvic vasculature, without evidence of aneurysm or dissection.  Normal appendix. There is no ascites or pneumoperitoneum and no pathologic distension of bowel.  No definite focal soft tissue masses in the abdomen to  suggest peritoneal disease at this time. No definite pathologic lymphadenopathy noted within the abdomen or pelvis.  Uterus, ovaries and to urinary bladder are unremarkable in appearance.  Musculoskeletal: There are no aggressive appearing lytic or blastic lesions noted in the visualized portions of the skeleton.  IMPRESSION:  1.  No findings to suggest new metastatic disease in the abdomen or pelvis. 2.  6 mm low attenuation lesion in lower pole of left kidney is too small to characterize, but given the stability over numerous prior examinations this  is favored to represent a small benign lesion such as a cyst.  Original Report Authenticated By: Florencia Reasons, M.D.    ASSESSMENT: This is a very pleasant 61 years old white female with metastatic non-small cell lung cancer, adenocarcinoma. She is currently on maintenance chemotherapy status post 61 cycles. The patient is tolerating her treatment fairly well. She has no evidence for disease progression on his recent scan. I discussed the scan results with the patient and her sister.  PLAN: I recommended for her to continue his maintenance treatment for now with the same regimen. She will receive cycle #62 of Alimta today.  The patient was given a refill for MS Contin, morphine sulfate as well as Xanax. I also given a new prescription for Zofran 8 mg by mouth Q8 hours as needed for nausea. She would come back for followup visit in 3 weeks with the start of the next cycle of her chemotherapy. The patient was advised to call me immediately if she has any concerning symptoms in the interval. All questions were answered. The patient knows to call the clinic with any problems, questions or concerns. We can certainly see the patient much sooner if necessary.  I spent 20 minutes counseling the patient face to face. The total time spent in the appointment was 40 minutes.

## 2011-03-07 ENCOUNTER — Telehealth: Payer: Self-pay | Admitting: Hematology and Oncology

## 2011-03-07 NOTE — Telephone Encounter (Signed)
Called pt, left message , regarding appt on 03/26/11

## 2011-03-25 ENCOUNTER — Other Ambulatory Visit: Payer: Medicare Other | Admitting: Lab

## 2011-03-26 ENCOUNTER — Other Ambulatory Visit (HOSPITAL_BASED_OUTPATIENT_CLINIC_OR_DEPARTMENT_OTHER): Payer: Medicare Other | Admitting: Lab

## 2011-03-26 ENCOUNTER — Ambulatory Visit (HOSPITAL_BASED_OUTPATIENT_CLINIC_OR_DEPARTMENT_OTHER): Payer: Medicare Other

## 2011-03-26 ENCOUNTER — Ambulatory Visit (HOSPITAL_BASED_OUTPATIENT_CLINIC_OR_DEPARTMENT_OTHER): Payer: Medicare Other | Admitting: Physician Assistant

## 2011-03-26 ENCOUNTER — Encounter: Payer: Self-pay | Admitting: Physician Assistant

## 2011-03-26 VITALS — BP 128/74 | HR 107 | Temp 97.1°F | Ht 63.75 in | Wt 155.9 lb

## 2011-03-26 DIAGNOSIS — C7931 Secondary malignant neoplasm of brain: Secondary | ICD-10-CM | POA: Diagnosis not present

## 2011-03-26 DIAGNOSIS — C7952 Secondary malignant neoplasm of bone marrow: Secondary | ICD-10-CM | POA: Diagnosis not present

## 2011-03-26 DIAGNOSIS — Z5111 Encounter for antineoplastic chemotherapy: Secondary | ICD-10-CM | POA: Diagnosis not present

## 2011-03-26 DIAGNOSIS — B370 Candidal stomatitis: Secondary | ICD-10-CM

## 2011-03-26 DIAGNOSIS — C7951 Secondary malignant neoplasm of bone: Secondary | ICD-10-CM | POA: Diagnosis not present

## 2011-03-26 DIAGNOSIS — C3410 Malignant neoplasm of upper lobe, unspecified bronchus or lung: Secondary | ICD-10-CM | POA: Diagnosis not present

## 2011-03-26 DIAGNOSIS — C3490 Malignant neoplasm of unspecified part of unspecified bronchus or lung: Secondary | ICD-10-CM

## 2011-03-26 DIAGNOSIS — C7949 Secondary malignant neoplasm of other parts of nervous system: Secondary | ICD-10-CM

## 2011-03-26 LAB — COMPREHENSIVE METABOLIC PANEL
AST: 20 U/L (ref 0–37)
Alkaline Phosphatase: 80 U/L (ref 39–117)
Glucose, Bld: 84 mg/dL (ref 70–99)
Potassium: 4.1 mEq/L (ref 3.5–5.3)
Sodium: 140 mEq/L (ref 135–145)
Total Bilirubin: 0.4 mg/dL (ref 0.3–1.2)
Total Protein: 6.7 g/dL (ref 6.0–8.3)

## 2011-03-26 LAB — CBC WITH DIFFERENTIAL/PLATELET
BASO%: 0.1 % (ref 0.0–2.0)
Basophils Absolute: 0 10*3/uL (ref 0.0–0.1)
EOS%: 0.5 % (ref 0.0–7.0)
MCH: 28.4 pg (ref 25.1–34.0)
MCHC: 33.4 g/dL (ref 31.5–36.0)
MCV: 85.1 fL (ref 79.5–101.0)
MONO%: 9.8 % (ref 0.0–14.0)
RBC: 4.43 10*6/uL (ref 3.70–5.45)
RDW: 15.1 % — ABNORMAL HIGH (ref 11.2–14.5)
lymph#: 1.9 10*3/uL (ref 0.9–3.3)
nRBC: 0 % (ref 0–0)

## 2011-03-26 MED ORDER — SODIUM CHLORIDE 0.9 % IJ SOLN
10.0000 mL | INTRAMUSCULAR | Status: DC | PRN
  Administered 2011-03-26: 10 mL
  Filled 2011-03-26: qty 10

## 2011-03-26 MED ORDER — SODIUM CHLORIDE 0.9 % IV SOLN
500.0000 mg/m2 | Freq: Once | INTRAVENOUS | Status: AC
  Administered 2011-03-26: 900 mg via INTRAVENOUS
  Filled 2011-03-26: qty 36

## 2011-03-26 MED ORDER — SODIUM CHLORIDE 0.9 % IV SOLN
Freq: Once | INTRAVENOUS | Status: AC
  Administered 2011-03-26: 11:00:00 via INTRAVENOUS

## 2011-03-26 MED ORDER — ONDANSETRON HCL 8 MG PO TABS: 8.0000 mg | ORAL_TABLET | Freq: Three times a day (TID) | ORAL | Status: DC | PRN

## 2011-03-26 MED ORDER — HEPARIN SOD (PORK) LOCK FLUSH 100 UNIT/ML IV SOLN
500.0000 [IU] | Freq: Once | INTRAVENOUS | Status: AC | PRN
  Administered 2011-03-26: 500 [IU]
  Filled 2011-03-26: qty 5

## 2011-03-26 MED ORDER — ONDANSETRON 8 MG/50ML IVPB (CHCC)
8.0000 mg | Freq: Once | INTRAVENOUS | Status: AC
  Administered 2011-03-26: 8 mg via INTRAVENOUS

## 2011-03-26 MED ORDER — DEXAMETHASONE SODIUM PHOSPHATE 10 MG/ML IJ SOLN
10.0000 mg | Freq: Once | INTRAMUSCULAR | Status: AC
  Administered 2011-03-26: 10 mg via INTRAVENOUS

## 2011-03-26 MED ORDER — FLUCONAZOLE 100 MG PO TABS: ORAL_TABLET | ORAL | Status: DC

## 2011-03-26 NOTE — Progress Notes (Signed)
[facility] [phone]   [phone]  OFFICE PROGRESS NOTE  [name], MD, MD [address] [name]. [place] [address]  DIAGNOSIS: Metastatic non-small cell lung cancer initially diagnosed with locally advanced stage IIIB with right Pancoast tumor involving the vertebral body as well as the foraminal canal invasion with spinal cord compression in October of 2007.   PRIOR THERAPY:  1. Status post concurrent chemoradiation with weekly carboplatin and paclitaxel, last dose was given November 18, 2005. 2. 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 3. Status post gamma knife radiotherapy to a solitary brain lesion located in the superior frontal area of the brain at [facility] in April of 2008. 4. Status post palliative radiotherapy to the lateral abdominal wall metastatic lesion under the care of Dr. Mitzi Hansen, completed March of 2009. 5. Status post 6 cycles of systemic chemotherapy with carboplatin and Alimta. Last dose was given July 26, 2007 with disease stabilization.   CURRENT THERAPY: : Maintenance chemotherapy with Alimta 500 mg per meter squared given every 3 weeks. The patient is status post 62 cycles.   INTERVAL HISTORY: Danielle [name] 61 y.o. female returns to the clinic today for follow up visit. The patient has no complaints today. She denied having any significant nausea or vomiting. She does have some nausea however is well-controlled with Zofran. She does request a refill for Zofran tablets. She has no chest pain or shortness of breath. She is concerned she may be developing thrush again.  MEDICAL HISTORY: Past Medical History  Diagnosis Date  . Hypertension   . Anemia   . H/O: pneumonia   . History of tobacco abuse quit 10/08    on nicotine patch  . Fibromyalgia   . Hypokalemia   . DJD (degenerative joint disease),  cervical   . Thrush 12/11/2010  . 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 had chemo and radiation for lung ca    ALLERGIES:  is allergic to contrast media; co-trimoxazole injection; iohexol; and sulfa drugs cross reactors.  MEDICATIONS:  Current Outpatient Prescriptions  Medication Sig Dispense Refill  . ALPRAZolam (XANAX) 0.25 MG tablet Take 1 tablet (0.25 mg total) by mouth at bedtime as needed.  30 tablet  0  . amLODipine (NORVASC) 10 MG tablet TAKE 1 TABLET EVERY DAY  90 tablet  1  . antipyrine-benzocaine (AURALGAN) otic solution Place 3 drops into both ears every 2 (two) hours as needed.        . fluconazole (DIFLUCAN) 100 MG tablet Take 2 tabs by mouth on day one, then 1 tab by mouth until completed  16 tablet  0  . folic acid (FOLVITE) 1 MG tablet TAKE 1 TABLET BY MOUTH EVERY DAY  30 tablet  2  . lisinopril (PRINIVIL,ZESTRIL) 5 MG tablet TAKE 1 TABLET BY MOUTH DAILY.  90 tablet  1  . morphine (MS CONTIN) 60 MG 12 hr tablet Take 1 tablet (60 mg total) by mouth 2 (two) times daily. Every 12 hours  60 tablet  0  . morphine (MSIR) 30 MG tablet Take 1 tablet (30 mg total) by mouth every 4 (four) hours as needed. Take 1 tab every 4-6 hours as needed for breakthrough pain  60 tablet  0  . ondansetron (ZOFRAN) 8 MG tablet Take 1 tablet (8 mg total) by mouth every 8 (eight)  hours as needed for nausea.  30 tablet  1  . PATANOL 0.1 % ophthalmic solution PUT 1 DROP IN AFFECTED EYE(S) 2X A DAY  5 mL  1  . prochlorperazine (COMPAZINE) 10 MG tablet Take 10 mg by mouth every 6 (six) hours as needed.        . simvastatin (ZOCOR) 40 MG tablet 1 TAB BY MOUTH DAILY  30 tablet  10    SURGICAL HISTORY:  Past Surgical History  Procedure Date  . Cervical laminectomy 1995  . Other surgical history 2008    Gamma knife surgery to remove brain met   . 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 Knee surgery 1990    Left x 2  . Portacath placement  -[name]    TIP IN CAVOATRIAL JUNCTION    REVIEW OF SYSTEMS:  A comprehensive review of systems was negative.   PHYSICAL EXAMINATION: General appearance: alert, cooperative and no distress Neck: no adenopathy Lymph nodes: Cervical, supraclavicular, and axillary nodes normal. Resp: clear to auscultation bilaterally Cardio: regular rate and rhythm, S1, S2 normal, no murmur, click, rub or gallop GI: soft, non-tender; bowel sounds normal; no masses,  no organomegaly Extremities: extremities normal, atraumatic, no cyanosis or edema Neurologic: Alert and oriented X 3, normal strength and tone. Normal symmetric reflexes. Normal coordination and gait Mouth: oropharynx reveals thrush  ECOG PERFORMANCE STATUS: 1 - Symptomatic but completely ambulatory  Blood pressure 128/74, pulse 107, temperature 97.1 F (36.2 C), temperature source Oral, height 5' 3.75" (1.619 m), weight 155 lb 14.4 oz (70.716 kg).  LABORATORY DATA: Lab Results  Component Value Date   WBC 8.2 03/26/2011   HGB 12.6 03/26/2011   HCT 37.7 03/26/2011   MCV 85.1 03/26/2011   PLT 392 03/26/2011      Chemistry      Component Value Date/Time   NA 141 03/05/2011 0840   K 3.9 03/05/2011 0840   CL 106 03/05/2011 0840   CO2 24 03/05/2011 0840   BUN 9 03/05/2011 0840   CREATININE 0.54 03/05/2011 0840      Component Value Date/Time   CALCIUM 8.9 03/05/2011 0840   ALKPHOS 78 03/05/2011 0840   AST 18 03/05/2011 0840   ALT 16 03/05/2011 0840   BILITOT 0.5 03/05/2011 0840       RADIOGRAPHIC STUDIES: Ct Chest W Contrast  03/01/2011  *RADIOLOGY REPORT*  Clinical Data:  Lung cancer with metastases to the spine, brain and omentum.  Chemotherapy underlying.  History of gamma knife therapy in October 2008.  Radiation therapy to the left groin in March 2009.  CT CHEST, ABDOMEN AND PELVIS WITH CONTRAST  Technique:  Multidetector CT imaging of the chest, abdomen and pelvis was performed following the standard protocol during bolus administration  of intravenous contrast.  Contrast: OMNIPAQUE IOHEXOL 300 MG/ML IV SOLN  Comparison:  CT of chest abdomen and pelvis 12/27/2010.   CT CHEST  Findings:  Mediastinum: Right internal jugular single lumen Port-A-Cath with tip terminating in the right atrium. Heart size is normal. There is no significant pericardial fluid, thickening or pericardial calcification. No pathologically enlarged mediastinal or hilar lymph nodes. No acute abnormality of the thoracic aorta; specifically, no aneurysm or dissection. There is atherosclerosis of the thoracic aorta, the great vessels of the mediastinum and the coronary arteries, including calcified atherosclerotic plaque in the left main, left anterior descending and left circumflex coronary arteries. Esophagus is unremarkable in appearance.  Lungs/Pleura: Pleural-based  2.1 x 1.4 cm nodular opacity in the right apex (image 12 of series 5) is unchanged over numerous prior examinations.  There are otherwise no new or enlarging suspicious appearing pulmonary nodules or masses identified on today's CT examination. Surrounding postradiation changes in the adjacent right upper lobe are again noted.  Focal area of pleural retraction in the anterior aspect of the right upper lobe is also unchanged. Lungs otherwise appear clear. Background of mild-moderate centrilobular emphysema and mild diffuse bronchial wall thickening unchanged.  No pleural effusions.  Musculoskeletal: There are no aggressive appearing lytic or blastic lesions noted in the visualized portions of the skeleton.  Post procedural changes of vertebroplasty at T4 and T7 are again noted. Vertebral plana at T3 is unchanged.  IMPRESSION:  1.  Findings in the thorax are similar to numerous prior examinations, as detailed above.  No findings to suggest recurrence of disease or new metastatic disease in the thorax. 2.  Vertebral plana at T3 (unchanged).  3. Atherosclerosis, including left main and two-vessel coronary artery  disease. Please note that although the presence of coronary artery calcium documents the presence of coronary artery disease, the severity of this disease and any potential stenosis cannot be assessed on this non-gated CT examination.  Assessment for potential risk factor modification, dietary therapy or pharmacologic therapy may be warranted, if clinically indicated. 4.  Mild-moderate centrilobular emphysema and mild diffuse bronchial wall thickening again noted.   CT ABDOMEN AND PELVIS  Findings:  Abdomen/Pelvis: The infused appearance of the liver, gallbladder, pancreas, spleen, bilateral adrenal glands and right kidney is unremarkable.  6 mm low attenuation lesion in the lower pole of left kidney is too small to characterize, but is unchanged over numerous prior examinations and is favored to represent a small benign lesion such as a cyst.  Extensive atherosclerosis of the abdominal and pelvic vasculature, without evidence of aneurysm or dissection.  Normal appendix. There is no ascites or pneumoperitoneum and no pathologic distension of bowel.  No definite focal soft tissue masses in the abdomen to suggest peritoneal disease at this time. No definite pathologic lymphadenopathy noted within the abdomen or pelvis.  Uterus, ovaries and to urinary bladder are unremarkable in appearance.  Musculoskeletal: There are no aggressive appearing lytic or blastic lesions noted in the visualized portions of the skeleton.  IMPRESSION:  1.  No findings to suggest new metastatic disease in the abdomen or pelvis. 2.  6 mm low attenuation lesion in lower pole of left kidney is too small to characterize, but given the stability over numerous prior examinations this is favored to represent a small benign lesion such as a cyst.  Original Report Authenticated By: Florencia Reasons, M.D.    ASSESSMENT/PLAN: This is a very pleasant 61 years old white female with metastatic non-small cell lung cancer, adenocarcinoma. She is  currently on maintenance chemotherapy status post 62 cycles. The patient is tolerating her treatment fairly well. She has no evidence for disease progression on his recent scan. Patient was discussed with Dr. Arbutus Ped. She will continue on her maintenance chemotherapy with single agent Alimta. A prescription for Diflucan to address the oral candidiasis was sent to her pharmacy of record via [name]. prescription as well as a refill for her Zofran 8 mg tablets total 30 with 1 refill. She will return in 3 weeks prior to her next scheduled cycle of maintenance Alimta with a repeat CBC differential and C. Met.   Danielle [name], Danielle Panico E, PA-C   All questions were answered.  The patient knows to call the clinic with any problems, questions or concerns. We can certainly see the patient much sooner if necessary.

## 2011-04-02 ENCOUNTER — Other Ambulatory Visit: Payer: Self-pay | Admitting: Medical Oncology

## 2011-04-02 DIAGNOSIS — C3490 Malignant neoplasm of unspecified part of unspecified bronchus or lung: Secondary | ICD-10-CM

## 2011-04-02 MED ORDER — MORPHINE SULFATE CR 60 MG PO TB12: 60.0000 mg | ORAL_TABLET | Freq: Two times a day (BID) | ORAL | Status: DC

## 2011-04-02 MED ORDER — ALPRAZOLAM 0.25 MG PO TABS: 0.2500 mg | ORAL_TABLET | Freq: Every evening | ORAL | Status: DC | PRN

## 2011-04-02 MED ORDER — MORPHINE SULFATE 30 MG PO TABS: 30.0000 mg | ORAL_TABLET | ORAL | Status: DC | PRN

## 2011-04-02 NOTE — Telephone Encounter (Signed)
Refills ordered and pt will pick up tomorrow

## 2011-04-16 ENCOUNTER — Other Ambulatory Visit (HOSPITAL_BASED_OUTPATIENT_CLINIC_OR_DEPARTMENT_OTHER): Payer: Medicare Other | Admitting: Lab

## 2011-04-16 ENCOUNTER — Ambulatory Visit: Payer: Medicare Other

## 2011-04-16 ENCOUNTER — Telehealth: Payer: Self-pay | Admitting: Internal Medicine

## 2011-04-16 ENCOUNTER — Ambulatory Visit (HOSPITAL_BASED_OUTPATIENT_CLINIC_OR_DEPARTMENT_OTHER): Payer: Medicare Other

## 2011-04-16 ENCOUNTER — Ambulatory Visit (HOSPITAL_BASED_OUTPATIENT_CLINIC_OR_DEPARTMENT_OTHER): Payer: Medicare Other | Admitting: Internal Medicine

## 2011-04-16 VITALS — BP 140/76 | HR 85 | Temp 97.4°F | Ht 63.75 in | Wt 156.0 lb

## 2011-04-16 DIAGNOSIS — R0989 Other specified symptoms and signs involving the circulatory and respiratory systems: Secondary | ICD-10-CM | POA: Diagnosis not present

## 2011-04-16 DIAGNOSIS — C7951 Secondary malignant neoplasm of bone: Secondary | ICD-10-CM

## 2011-04-16 DIAGNOSIS — C3410 Malignant neoplasm of upper lobe, unspecified bronchus or lung: Secondary | ICD-10-CM | POA: Diagnosis not present

## 2011-04-16 DIAGNOSIS — C7931 Secondary malignant neoplasm of brain: Secondary | ICD-10-CM | POA: Diagnosis not present

## 2011-04-16 DIAGNOSIS — Z5111 Encounter for antineoplastic chemotherapy: Secondary | ICD-10-CM | POA: Diagnosis not present

## 2011-04-16 DIAGNOSIS — C3490 Malignant neoplasm of unspecified part of unspecified bronchus or lung: Secondary | ICD-10-CM | POA: Diagnosis not present

## 2011-04-16 DIAGNOSIS — C7952 Secondary malignant neoplasm of bone marrow: Secondary | ICD-10-CM

## 2011-04-16 DIAGNOSIS — C7949 Secondary malignant neoplasm of other parts of nervous system: Secondary | ICD-10-CM | POA: Diagnosis not present

## 2011-04-16 LAB — CBC WITH DIFFERENTIAL/PLATELET
Basophils Absolute: 0 10*3/uL (ref 0.0–0.1)
EOS%: 0.5 % (ref 0.0–7.0)
Eosinophils Absolute: 0 10*3/uL (ref 0.0–0.5)
HGB: 12.3 g/dL (ref 11.6–15.9)
LYMPH%: 25.3 % (ref 14.0–49.7)
MCH: 28.6 pg (ref 25.1–34.0)
MCV: 87.2 fL (ref 79.5–101.0)
MONO%: 9.4 % (ref 0.0–14.0)
NEUT#: 4.9 10*3/uL (ref 1.5–6.5)
Platelets: 447 10*3/uL — ABNORMAL HIGH (ref 145–400)
RBC: 4.3 10*6/uL (ref 3.70–5.45)

## 2011-04-16 LAB — COMPREHENSIVE METABOLIC PANEL
Alkaline Phosphatase: 80 U/L (ref 39–117)
CO2: 25 mEq/L (ref 19–32)
Creatinine, Ser: 0.57 mg/dL (ref 0.50–1.10)
Glucose, Bld: 87 mg/dL (ref 70–99)
Total Bilirubin: 0.2 mg/dL — ABNORMAL LOW (ref 0.3–1.2)

## 2011-04-16 MED ORDER — CYANOCOBALAMIN 1000 MCG/ML IJ SOLN
1000.0000 ug | Freq: Once | INTRAMUSCULAR | Status: AC
  Administered 2011-04-16: 1000 ug via INTRAMUSCULAR

## 2011-04-16 MED ORDER — HEPARIN SOD (PORK) LOCK FLUSH 100 UNIT/ML IV SOLN
500.0000 [IU] | Freq: Once | INTRAVENOUS | Status: AC | PRN
  Administered 2011-04-16: 500 [IU]
  Filled 2011-04-16: qty 5

## 2011-04-16 MED ORDER — SODIUM CHLORIDE 0.9 % IJ SOLN
10.0000 mL | INTRAMUSCULAR | Status: DC | PRN
  Administered 2011-04-16: 10 mL
  Filled 2011-04-16: qty 10

## 2011-04-16 MED ORDER — ONDANSETRON 8 MG/50ML IVPB (CHCC)
8.0000 mg | Freq: Once | INTRAVENOUS | Status: AC
  Administered 2011-04-16: 8 mg via INTRAVENOUS

## 2011-04-16 MED ORDER — DEXAMETHASONE SODIUM PHOSPHATE 10 MG/ML IJ SOLN
10.0000 mg | Freq: Once | INTRAMUSCULAR | Status: AC
  Administered 2011-04-16: 10 mg via INTRAVENOUS

## 2011-04-16 MED ORDER — SODIUM CHLORIDE 0.9 % IV SOLN
Freq: Once | INTRAVENOUS | Status: AC
  Administered 2011-04-16: 10:00:00 via INTRAVENOUS

## 2011-04-16 MED ORDER — SODIUM CHLORIDE 0.9 % IV SOLN
500.0000 mg/m2 | Freq: Once | INTRAVENOUS | Status: AC
  Administered 2011-04-16: 900 mg via INTRAVENOUS
  Filled 2011-04-16: qty 36

## 2011-04-16 NOTE — Progress Notes (Signed)
[facility] Telephone:([phone]   Fax:([phone]  OFFICE PROGRESS NOTE  [name], MD, MD [address] [place] [address]  DIAGNOSIS: Metastatic non-small cell lung cancer initially diagnosed with locally advanced stage IIIB with right Pancoast tumor involving the vertebral body as well as the foraminal canal invasion with spinal cord compression in October of 2007.   PRIOR THERAPY:  1. Status post concurrent chemoradiation with weekly carboplatin and paclitaxel, last dose was given November 18, 2005. 2. 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 3. Status post gamma knife radiotherapy to a solitary brain lesion located in the superior frontal area of the brain at [facility] in April of 2008. 4. Status post palliative radiotherapy to the lateral abdominal wall metastatic lesion under the care of Dr. Mitzi Hansen, completed March of 2009. 5. Status post 6 cycles of systemic chemotherapy with carboplatin and Alimta. Last dose was given July 26, 2007 with disease stabilization.  CURRENT THERAPY: : Maintenance chemotherapy with Alimta 500 mg per meter squared given every 3 weeks. The patient is status post 62 cycles.   INTERVAL HISTORY: Danielle [name] 61 y.o. female returns to the clinic today for followup visit. The patient has no complaints today except for chest congestion secondary to allergy. She denied having any significant cough, or hemoptysis. She has no chest pain or shortness of breath. No significant weight loss or night sweats. She tolerated the last cycle of her systemic chemotherapy fairly well with no significant nausea or vomiting, fever or chills.  MEDICAL HISTORY: Past Medical History  Diagnosis Date  . Hypertension   . Anemia   . H/O: pneumonia   . History of tobacco abuse quit 10/08    on nicotine patch  . Fibromyalgia   .  Hypokalemia   . DJD (degenerative joint disease), cervical   . Thrush 12/11/2010  . 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 had chemo and radiation for lung ca    ALLERGIES:  is allergic to contrast media; co-trimoxazole injection; iohexol; and sulfa drugs cross reactors.  MEDICATIONS:  Current Outpatient Prescriptions  Medication Sig Dispense Refill  . ALPRAZolam (XANAX) 0.25 MG tablet Take 1 tablet (0.25 mg total) by mouth at bedtime as needed.  30 tablet  0  . amLODipine (NORVASC) 10 MG tablet TAKE 1 TABLET EVERY DAY  90 tablet  1  . antipyrine-benzocaine (AURALGAN) otic solution Place 3 drops into both ears every 2 (two) hours as needed.        . folic acid (FOLVITE) 1 MG tablet TAKE 1 TABLET BY MOUTH EVERY DAY  30 tablet  2  . lisinopril (PRINIVIL,ZESTRIL) 5 MG tablet TAKE 1 TABLET BY MOUTH DAILY.  90 tablet  1  . morphine (MS CONTIN) 60 MG 12 hr tablet Take 1 tablet (60 mg total) by mouth 2 (two) times daily. Every 12 hours  60 tablet  0  . morphine (MSIR) 30 MG tablet Take 1 tablet (30 mg total) by mouth every 4 (four) hours as needed. Take 1 tab every 4-6 hours as needed for breakthrough pain  60 tablet  0  . ondansetron (ZOFRAN) 8 MG tablet Take 1 tablet (8 mg total) by mouth every 8 (eight) hours as needed for nausea.  30 tablet  1  . PATANOL 0.1 % ophthalmic solution PUT 1 DROP IN AFFECTED  EYE(S) 2X A DAY  5 mL  1  . prochlorperazine (COMPAZINE) 10 MG tablet Take 10 mg by mouth every 6 (six) hours as needed.        . simvastatin (ZOCOR) 40 MG tablet 1 TAB BY MOUTH DAILY  30 tablet  10    SURGICAL HISTORY:  Past Surgical History  Procedure Date  . Cervical laminectomy 1995  . Other surgical history 2008    Gamma knife surgery to remove brain met   . 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 Knee surgery 1990    Left x 2  . Portacath placement -[name]    TIP IN CAVOATRIAL JUNCTION    REVIEW OF SYSTEMS:  A  comprehensive review of systems was negative except for: Respiratory: positive for congestion   PHYSICAL EXAMINATION: General appearance: alert, cooperative and no distress Head: Normocephalic, without obvious abnormality, atraumatic Neck: no adenopathy Lymph nodes: Cervical, supraclavicular, and axillary nodes normal. Resp: clear to auscultation bilaterally Cardio: regular rate and rhythm, S1, S2 normal, no murmur, click, rub or gallop GI: soft, non-tender; bowel sounds normal; no masses,  no organomegaly Extremities: extremities normal, atraumatic, no cyanosis or edema Neurologic: Alert and oriented X 3, normal strength and tone. Normal symmetric reflexes. Normal coordination and gait  ECOG PERFORMANCE STATUS: 0 - Asymptomatic  Blood pressure 140/76, pulse 85, temperature 97.4 F (36.3 C), temperature source Oral, height 5' 3.75" (1.619 m), weight 156 lb (70.761 kg).  LABORATORY DATA: Lab Results  Component Value Date   WBC 7.6 04/16/2011   HGB 12.3 04/16/2011   HCT 37.5 04/16/2011   MCV 87.2 04/16/2011   PLT 447* 04/16/2011      Chemistry      Component Value Date/Time   NA 140 03/26/2011 0840   K 4.1 03/26/2011 0840   CL 107 03/26/2011 0840   CO2 23 03/26/2011 0840   BUN 11 03/26/2011 0840   CREATININE 0.59 03/26/2011 0840      Component Value Date/Time   CALCIUM 9.0 03/26/2011 0840   ALKPHOS 80 03/26/2011 0840   AST 20 03/26/2011 0840   ALT 9 03/26/2011 0840   BILITOT 0.4 03/26/2011 0840       RADIOGRAPHIC STUDIES: No results found.  ASSESSMENT: This is a very pleasant 61 years old white female with metastatic non-small cell lung cancer, currently on maintenance chemotherapy with Alimta status post a 63 cycles.  PLAN: The patient is doing fine and tolerating her treatment fairly well. We'll proceed with cycle #64 today. I would see her back for followup visit in 3 weeks with repeat CT scan of the chest, abdomen and pelvis for restaging of her disease. For the congestion and  allergy advice addition to start taking Claritin 10 mg by mouth daily as needed. She was advised to call me immediately she has any concerning symptoms in the interval.  All questions were answered. The patient knows to call the clinic with any problems, questions or concerns. We can certainly see the patient much sooner if necessary.  I spent 20 minutes counseling the patient face to face. The total time spent in the appointment was 30 minutes.

## 2011-04-16 NOTE — Telephone Encounter (Signed)
Gv pt appt for april2013.  scheduled ct scan for 04/26 @ [facility]

## 2011-04-22 DIAGNOSIS — Z8669 Personal history of other diseases of the nervous system and sense organs: Secondary | ICD-10-CM | POA: Diagnosis not present

## 2011-04-22 DIAGNOSIS — C7949 Secondary malignant neoplasm of other parts of nervous system: Secondary | ICD-10-CM | POA: Diagnosis not present

## 2011-04-22 DIAGNOSIS — C801 Malignant (primary) neoplasm, unspecified: Secondary | ICD-10-CM | POA: Diagnosis not present

## 2011-04-22 DIAGNOSIS — C7931 Secondary malignant neoplasm of brain: Secondary | ICD-10-CM | POA: Diagnosis not present

## 2011-04-29 ENCOUNTER — Other Ambulatory Visit: Payer: Self-pay | Admitting: *Deleted

## 2011-04-29 ENCOUNTER — Other Ambulatory Visit: Payer: Self-pay | Admitting: Internal Medicine

## 2011-04-29 MED ORDER — LISINOPRIL 5 MG PO TABS: 5.0000 mg | ORAL_TABLET | Freq: Every day | ORAL | Status: DC

## 2011-04-29 NOTE — Telephone Encounter (Signed)
Received faxed refill request from pharmacy. Refill sent to pharmacy electronically. 

## 2011-05-03 ENCOUNTER — Ambulatory Visit (HOSPITAL_COMMUNITY)
Admission: RE | Admit: 2011-05-03 | Discharge: 2011-05-03 | Disposition: A | Discharge status: Home / Self Care | Payer: Medicare Other | Source: Ambulatory Visit | Attending: Internal Medicine | Admitting: Internal Medicine

## 2011-05-03 DIAGNOSIS — N289 Disorder of kidney and ureter, unspecified: Secondary | ICD-10-CM | POA: Insufficient documentation

## 2011-05-03 DIAGNOSIS — J438 Other emphysema: Secondary | ICD-10-CM | POA: Diagnosis not present

## 2011-05-03 DIAGNOSIS — C786 Secondary malignant neoplasm of retroperitoneum and peritoneum: Secondary | ICD-10-CM | POA: Diagnosis not present

## 2011-05-03 DIAGNOSIS — I2510 Atherosclerotic heart disease of native coronary artery without angina pectoris: Secondary | ICD-10-CM | POA: Diagnosis not present

## 2011-05-03 DIAGNOSIS — C3490 Malignant neoplasm of unspecified part of unspecified bronchus or lung: Secondary | ICD-10-CM

## 2011-05-03 DIAGNOSIS — C7931 Secondary malignant neoplasm of brain: Secondary | ICD-10-CM | POA: Insufficient documentation

## 2011-05-03 DIAGNOSIS — C7949 Secondary malignant neoplasm of other parts of nervous system: Secondary | ICD-10-CM | POA: Insufficient documentation

## 2011-05-03 MED ORDER — IOHEXOL 300 MG/ML  SOLN
100.0000 mL | Freq: Once | INTRAMUSCULAR | Status: AC | PRN
  Administered 2011-05-03: 100 mL via INTRAVENOUS

## 2011-05-07 ENCOUNTER — Other Ambulatory Visit: Payer: Medicare Other

## 2011-05-07 ENCOUNTER — Ambulatory Visit: Payer: Medicare Other

## 2011-05-07 ENCOUNTER — Telehealth: Payer: Self-pay | Admitting: *Deleted

## 2011-05-07 ENCOUNTER — Ambulatory Visit (HOSPITAL_BASED_OUTPATIENT_CLINIC_OR_DEPARTMENT_OTHER): Payer: Medicare Other

## 2011-05-07 ENCOUNTER — Ambulatory Visit (HOSPITAL_BASED_OUTPATIENT_CLINIC_OR_DEPARTMENT_OTHER): Payer: Medicare Other | Admitting: Internal Medicine

## 2011-05-07 ENCOUNTER — Telehealth: Payer: Self-pay | Admitting: Internal Medicine

## 2011-05-07 ENCOUNTER — Other Ambulatory Visit (HOSPITAL_BASED_OUTPATIENT_CLINIC_OR_DEPARTMENT_OTHER): Payer: Medicare Other | Admitting: Lab

## 2011-05-07 VITALS — BP 119/74 | HR 78 | Temp 97.5°F | Ht 63.75 in | Wt 157.9 lb

## 2011-05-07 DIAGNOSIS — Z5111 Encounter for antineoplastic chemotherapy: Secondary | ICD-10-CM

## 2011-05-07 DIAGNOSIS — C3410 Malignant neoplasm of upper lobe, unspecified bronchus or lung: Secondary | ICD-10-CM

## 2011-05-07 DIAGNOSIS — C7952 Secondary malignant neoplasm of bone marrow: Secondary | ICD-10-CM | POA: Diagnosis not present

## 2011-05-07 DIAGNOSIS — C786 Secondary malignant neoplasm of retroperitoneum and peritoneum: Secondary | ICD-10-CM

## 2011-05-07 DIAGNOSIS — C7931 Secondary malignant neoplasm of brain: Secondary | ICD-10-CM | POA: Diagnosis not present

## 2011-05-07 DIAGNOSIS — C3490 Malignant neoplasm of unspecified part of unspecified bronchus or lung: Secondary | ICD-10-CM | POA: Diagnosis not present

## 2011-05-07 DIAGNOSIS — C7951 Secondary malignant neoplasm of bone: Secondary | ICD-10-CM

## 2011-05-07 DIAGNOSIS — C7949 Secondary malignant neoplasm of other parts of nervous system: Secondary | ICD-10-CM | POA: Diagnosis not present

## 2011-05-07 LAB — CBC WITH DIFFERENTIAL/PLATELET
Basophils Absolute: 0 10*3/uL (ref 0.0–0.1)
Eosinophils Absolute: 0.1 10*3/uL (ref 0.0–0.5)
HGB: 12.6 g/dL (ref 11.6–15.9)
MCV: 87.6 fL (ref 79.5–101.0)
MONO#: 0.8 10*3/uL (ref 0.1–0.9)
NEUT#: 4.9 10*3/uL (ref 1.5–6.5)
RDW: 15.7 % — ABNORMAL HIGH (ref 11.2–14.5)
lymph#: 2.3 10*3/uL (ref 0.9–3.3)

## 2011-05-07 LAB — COMPREHENSIVE METABOLIC PANEL
Albumin: 4.2 g/dL (ref 3.5–5.2)
CO2: 25 mEq/L (ref 19–32)
Chloride: 106 mEq/L (ref 96–112)
Glucose, Bld: 85 mg/dL (ref 70–99)
Potassium: 3.8 mEq/L (ref 3.5–5.3)
Sodium: 140 mEq/L (ref 135–145)
Total Protein: 6.4 g/dL (ref 6.0–8.3)

## 2011-05-07 MED ORDER — SODIUM CHLORIDE 0.9 % IJ SOLN
10.0000 mL | INTRAMUSCULAR | Status: DC | PRN
  Administered 2011-05-07: 10 mL
  Filled 2011-05-07: qty 10

## 2011-05-07 MED ORDER — SODIUM CHLORIDE 0.9 % IV SOLN
Freq: Once | INTRAVENOUS | Status: AC
  Administered 2011-05-07: 12:00:00 via INTRAVENOUS

## 2011-05-07 MED ORDER — LIDOCAINE-PRILOCAINE 2.5-2.5 % EX CREA: TOPICAL_CREAM | CUTANEOUS | Status: DC | PRN

## 2011-05-07 MED ORDER — DEXAMETHASONE SODIUM PHOSPHATE 10 MG/ML IJ SOLN
10.0000 mg | Freq: Once | INTRAMUSCULAR | Status: AC
  Administered 2011-05-07: 10 mg via INTRAVENOUS

## 2011-05-07 MED ORDER — ONDANSETRON 8 MG/50ML IVPB (CHCC)
8.0000 mg | Freq: Once | INTRAVENOUS | Status: AC
  Administered 2011-05-07: 8 mg via INTRAVENOUS

## 2011-05-07 MED ORDER — MORPHINE SULFATE CR 60 MG PO TB12: 60.0000 mg | ORAL_TABLET | Freq: Two times a day (BID) | ORAL | Status: DC

## 2011-05-07 MED ORDER — HEPARIN SOD (PORK) LOCK FLUSH 100 UNIT/ML IV SOLN
500.0000 [IU] | Freq: Once | INTRAVENOUS | Status: AC | PRN
  Administered 2011-05-07: 500 [IU]
  Filled 2011-05-07: qty 5

## 2011-05-07 MED ORDER — SODIUM CHLORIDE 0.9 % IV SOLN
500.0000 mg/m2 | Freq: Once | INTRAVENOUS | Status: AC
  Administered 2011-05-07: 900 mg via INTRAVENOUS
  Filled 2011-05-07: qty 36

## 2011-05-07 MED ORDER — ALPRAZOLAM 0.25 MG PO TABS: 0.2500 mg | ORAL_TABLET | Freq: Every evening | ORAL | Status: DC | PRN

## 2011-05-07 MED ORDER — MORPHINE SULFATE 30 MG PO TABS: 30.0000 mg | ORAL_TABLET | ORAL | Status: DC | PRN

## 2011-05-07 NOTE — Telephone Encounter (Signed)
Gv pt appt for may2013 

## 2011-05-07 NOTE — Progress Notes (Signed)
[facility] Telephone:([phone]) [phone]   Fax:([phone]  OFFICE PROGRESS NOTE  [name], MD, MD [address] [place] [address]  DIAGNOSIS: Metastatic non-small cell lung cancer initially diagnosed with locally advanced stage IIIB with right Pancoast tumor involving the vertebral body as well as the foraminal canal invasion with spinal cord compression in October of 2007.   PRIOR THERAPY:  1. Status post concurrent chemoradiation with weekly carboplatin and paclitaxel, last dose was given November 18, 2005. 2. 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 3. Status post gamma knife radiotherapy to a solitary brain lesion located in the superior frontal area of the brain at [facility] in April of 2008. 4. Status post palliative radiotherapy to the lateral abdominal wall metastatic lesion under the care of Dr. Mitzi Hansen, completed March of 2009. 5. Status post 6 cycles of systemic chemotherapy with carboplatin and Alimta. Last dose was given July 26, 2007 with disease stabilization.  CURRENT THERAPY: : Maintenance chemotherapy with Alimta 500 mg per meter squared given every 3 weeks. The patient is status post 63 cycles.   INTERVAL HISTORY: Danielle [name] 61 y.o. female returns to the clinic today for followup visit accompanied by her sister. Patient is feeling fine today with no specific complaints. He denied having any significant chest pain or shortness of breath, no cough or hemoptysis. No weight loss or night sweats. She was seen by Dr. Johny Drilling at [facility] or evaluation of her brain lesion and there was no evidence for disease recurrence. The patient tolerated the last cycle of her systemic chemotherapy with maintenance Alimta fairly well. She has repeat CT scan of the chest, abdomen and pelvis performed recently and she is here today for  evaluation and discussion of her scan results.  MEDICAL HISTORY: Past Medical History  Diagnosis Date  . Hypertension   . Anemia   . H/O: pneumonia   . History of tobacco abuse quit 10/08    on nicotine patch  . Fibromyalgia   . Hypokalemia   . DJD (degenerative joint disease), cervical   . Thrush 12/11/2010  . 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 had chemo and radiation for lung ca    ALLERGIES:  is allergic to contrast media; co-trimoxazole injection; iohexol; and sulfa drugs cross reactors.  MEDICATIONS:  Current Outpatient Prescriptions  Medication Sig Dispense Refill  . ALPRAZolam (XANAX) 0.25 MG tablet Take 1 tablet (0.25 mg total) by mouth at bedtime as needed.  30 tablet  0  . amLODipine (NORVASC) 10 MG tablet TAKE 1 TABLET EVERY DAY  90 tablet  1  . antipyrine-benzocaine (AURALGAN) otic solution Place 3 drops into both ears every 2 (two) hours as needed.        . folic acid (FOLVITE) 1 MG tablet TAKE 1 TABLET BY MOUTH EVERY DAY  30 tablet  2  . lisinopril (PRINIVIL,ZESTRIL) 5 MG tablet Take 1 tablet (5 mg total) by mouth daily.  90 tablet  0  . morphine (MS CONTIN) 60 MG 12 hr tablet Take 1 tablet (60 mg total) by mouth 2 (two) times daily. Every 12 hours  60 tablet  0  . morphine (MSIR) 30 MG tablet Take 1 tablet (30 mg total) by mouth every 4 (four) hours as needed. Take 1 tab every 4-6 hours as needed for breakthrough pain  60 tablet  0  . ondansetron (ZOFRAN) 8 MG tablet Take 1 tablet (8 mg total) by mouth every 8 (eight) hours as needed for nausea.  30 tablet  1  . PATANOL 0.1 % ophthalmic solution PUT 1 DROP IN AFFECTED EYE(S) 2X A DAY  5 mL  1  . prochlorperazine (COMPAZINE) 10 MG tablet Take 10 mg by mouth every 6 (six) hours as needed.        . simvastatin (ZOCOR) 40 MG tablet 1 TAB BY MOUTH DAILY  30 tablet  10  . lidocaine-prilocaine (EMLA) cream Apply topically as needed.  30 g  0    SURGICAL HISTORY:  Past Surgical History    Procedure Date  . Cervical laminectomy 1995  . Other surgical history 2008    Gamma knife surgery to remove brain met   . 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 Knee surgery 1990    Left x 2  . Portacath placement -[name]    TIP IN CAVOATRIAL JUNCTION    REVIEW OF SYSTEMS:  A comprehensive review of systems was negative.   PHYSICAL EXAMINATION: General appearance: alert, cooperative and no distress Head: Normocephalic, without obvious abnormality, atraumatic Neck: no adenopathy Lymph nodes: Cervical, supraclavicular, and axillary nodes normal. Resp: clear to auscultation bilaterally Cardio: regular rate and rhythm, S1, S2 normal, no murmur, click, rub or gallop GI: soft, non-tender; bowel sounds normal; no masses,  no organomegaly Extremities: extremities normal, atraumatic, no cyanosis or edema Neurologic: Alert and oriented X 3, normal strength and tone. Normal symmetric reflexes. Normal coordination and gait  ECOG PERFORMANCE STATUS: 1 - Symptomatic but completely ambulatory  Blood pressure 119/74, pulse 78, temperature 97.5 F (36.4 C), temperature source Oral, height 5' 3.75" (1.619 m), weight 157 lb 14.4 oz (71.623 kg).  LABORATORY DATA: Lab Results  Component Value Date   WBC 8.0 05/07/2011   HGB 12.6 05/07/2011   HCT 38.3 05/07/2011   MCV 87.6 05/07/2011   PLT 278 05/07/2011      Chemistry      Component Value Date/Time   NA 141 04/16/2011 0832   K 4.0 04/16/2011 0832   CL 106 04/16/2011 0832   CO2 25 04/16/2011 0832   BUN 7 04/16/2011 0832   CREATININE 0.57 04/16/2011 0832      Component Value Date/Time   CALCIUM 9.3 04/16/2011 0832   ALKPHOS 80 04/16/2011 0832   AST 17 04/16/2011 0832   ALT 8 04/16/2011 0832   BILITOT 0.2* 04/16/2011 0832       RADIOGRAPHIC STUDIES: Ct Chest W Contrast  05/03/2011  *RADIOLOGY REPORT*  Clinical Data:  Metastatic lung cancer to spine, brain, and omentum.  Diagnosed in 2007.  Gamma knife complete.   Chemotherapy in progress.  No current complaints.  CT CHEST, ABDOMEN AND PELVIS WITH CONTRAST  Technique: Contiguous axial images of the chest abdomen and pelvis were obtained after IV contrast administration.  Contrast: 100  ml Omnipaque-300  Comparison: 03/01/2011   CT CHEST  Findings: Lung windows demonstrate moderate centrilobular emphysema.  Probable secretions within a left lower lobe segmental bronchus on image 32.  Not present on the prior.  Similar probable scarring within the anterior right upper lobe on image 17.  Scattered areas of interstitial thickening within the right upper lobe.  Right apical pleural based opacity is unchanged, and measures 1.7 x 1.4 cm on image 8.  Similar in appearance back to 04/16/2010.  Left lung  clear.  Soft tissue windows demonstrate a right-sided Port-A-Cath which terminates at the mid right atrium. No supraclavicular adenopathy. Normal heart size without pericardial or pleural effusion. Proximal LAD atherosclerosis. No central pulmonary embolism, on this non-dedicated study.  No mediastinal or hilar adenopathy.  IMPRESSION:  1.  Similar appearance of the chest, without evidence of disease progression. 2.  Probable scarring at the medial right apex with moderate centrilobular emphysema and other areas of interstitial thickening throughout the right upper lobe. 3.  Probable secretions within the left lower lobe endobronchial tree.  New since the prior. Recommend attention on follow-up. 4. Age advanced coronary artery atherosclerosis.  Recommend assessment of coronary risk factors and consideration of medical therapy.   CT ABDOMEN AND PELVIS  Findings:  Normal liver, spleen, stomach, pancreas, gallbladder, biliary tract, adrenal glands.  Lower pole left renal lesion is too small to characterize but likely a cyst.  Subtle right proximal periureteric edema is suspected on image 73.  This is similar on the prior exam.  No retroperitoneal or retrocrural adenopathy.  Normal  colon, appendix, and terminal ileum.  Normal small bowel without abdominal ascites.    No pelvic adenopathy.    Normal urinary bladder and uterus.  No adnexal mass.  No significant free fluid.  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 More subtle findings on the right.  Prior vertebral augmentation at T4 and T7.  T3 vertebral plana again identified.  IMPRESSION:  1.  No acute process or evidence of metastatic disease in the abdomen or pelvis. 2.  Subtle edema suspected about the proximal right ureter. Chronic finding.  Correlate with any symptoms of right-sided urinary tract infection. 3.  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  Original Report Authenticated By: Consuello Bossier, M.D.    ASSESSMENT: This is a very pleasant 61 years old white female with metastatic non-small cell lung cancer count on maintenance chemotherapy with Alimta status post 63 cycles. The patient is doing fine and tolerating her treatment fairly well. She has no evidence for disease progression on his recent scans.  PLAN: I discussed the scan results with the patient and her sister. I recommended for her to proceed with cycle #64 today as scheduled. She would come back for followup visit in 3 weeks with the start of cycle #65. She was given a refill today for her pain medication in addition to Xanax. She was advised to call me immediately if she has any concerning symptoms in the interval.  All questions were answered. The patient knows to call the clinic with any problems, questions or concerns. We can certainly see the patient much sooner if necessary.  I spent 20 minutes counseling the patient face to face. The total time spent in the appointment was 30 minutes.

## 2011-05-07 NOTE — Telephone Encounter (Signed)
Per staff message from [place], I have scheduled treatments for the patient. Appts in computer and [place] aware.  JMW

## 2011-05-07 NOTE — Patient Instructions (Signed)
[facility] Discharge Instructions for Patients Receiving Chemotherapy  Today you received the following chemotherapy agents Alimta To help prevent nausea and vomiting after your treatment, we encourage you to take your nausea medication per Dr. Mohamed.   If you develop nausea and vomiting that is not controlled by your nausea medication, call the clinic. If it is after clinic hours your family physician or the after hours number for the clinic or go to the Emergency Department.   BELOW ARE SYMPTOMS THAT SHOULD BE REPORTED IMMEDIATELY:  *FEVER GREATER THAN 100.5 F  *CHILLS WITH OR WITHOUT FEVER  NAUSEA AND VOMITING THAT IS NOT CONTROLLED WITH YOUR NAUSEA MEDICATION  *UNUSUAL SHORTNESS OF BREATH  *UNUSUAL BRUISING OR BLEEDING  TENDERNESS IN MOUTH AND THROAT WITH OR WITHOUT PRESENCE OF ULCERS  *URINARY PROBLEMS  *BOWEL PROBLEMS  UNUSUAL RASH Items with * indicate a potential emergency and should be followed up as soon as possible.  . Feel free to call the clinic you have any questions or concerns. The clinic phone number is [phone].   I have been informed and understand all the instructions given to me. I know to contact the clinic, my physician, or go to the Emergency Department if any problems should occur. I do not have any questions at this time, but understand that I may call the clinic during office hours   should I have any questions or need assistance in obtaining follow up care.    __________________________________________  _____________  __________ Signature of Patient or Authorized Representative            Date                   Time    __________________________________________ Nurse's Signature    

## 2011-05-28 ENCOUNTER — Other Ambulatory Visit (HOSPITAL_BASED_OUTPATIENT_CLINIC_OR_DEPARTMENT_OTHER): Payer: Medicare Other | Admitting: Lab

## 2011-05-28 ENCOUNTER — Ambulatory Visit (HOSPITAL_BASED_OUTPATIENT_CLINIC_OR_DEPARTMENT_OTHER): Payer: Medicare Other

## 2011-05-28 ENCOUNTER — Ambulatory Visit (HOSPITAL_BASED_OUTPATIENT_CLINIC_OR_DEPARTMENT_OTHER): Payer: Medicare Other | Admitting: Physician Assistant

## 2011-05-28 ENCOUNTER — Telehealth: Payer: Self-pay | Admitting: *Deleted

## 2011-05-28 ENCOUNTER — Telehealth: Payer: Self-pay | Admitting: Internal Medicine

## 2011-05-28 ENCOUNTER — Encounter: Payer: Self-pay | Admitting: Physician Assistant

## 2011-05-28 ENCOUNTER — Ambulatory Visit: Payer: Medicare Other | Admitting: Internal Medicine

## 2011-05-28 VITALS — BP 129/75 | HR 91 | Temp 97.4°F | Ht 63.75 in | Wt 158.6 lb

## 2011-05-28 DIAGNOSIS — J00 Acute nasopharyngitis [common cold]: Secondary | ICD-10-CM

## 2011-05-28 DIAGNOSIS — Z5111 Encounter for antineoplastic chemotherapy: Secondary | ICD-10-CM

## 2011-05-28 DIAGNOSIS — C7952 Secondary malignant neoplasm of bone marrow: Secondary | ICD-10-CM

## 2011-05-28 DIAGNOSIS — C3410 Malignant neoplasm of upper lobe, unspecified bronchus or lung: Secondary | ICD-10-CM

## 2011-05-28 DIAGNOSIS — C7931 Secondary malignant neoplasm of brain: Secondary | ICD-10-CM | POA: Diagnosis not present

## 2011-05-28 DIAGNOSIS — C3490 Malignant neoplasm of unspecified part of unspecified bronchus or lung: Secondary | ICD-10-CM

## 2011-05-28 DIAGNOSIS — C7949 Secondary malignant neoplasm of other parts of nervous system: Secondary | ICD-10-CM | POA: Diagnosis not present

## 2011-05-28 DIAGNOSIS — C7951 Secondary malignant neoplasm of bone: Secondary | ICD-10-CM | POA: Diagnosis not present

## 2011-05-28 LAB — CBC WITH DIFFERENTIAL/PLATELET
Basophils Absolute: 0 10*3/uL (ref 0.0–0.1)
Eosinophils Absolute: 0.1 10*3/uL (ref 0.0–0.5)
HGB: 11.8 g/dL (ref 11.6–15.9)
LYMPH%: 23.7 % (ref 14.0–49.7)
MONO#: 1.3 10*3/uL — ABNORMAL HIGH (ref 0.1–0.9)
NEUT#: 7.2 10*3/uL — ABNORMAL HIGH (ref 1.5–6.5)
Platelets: 369 10*3/uL (ref 145–400)
RBC: 4.05 10*6/uL (ref 3.70–5.45)
RDW: 15.1 % — ABNORMAL HIGH (ref 11.2–14.5)
WBC: 11.2 10*3/uL — ABNORMAL HIGH (ref 3.9–10.3)

## 2011-05-28 LAB — COMPREHENSIVE METABOLIC PANEL
Albumin: 3.8 g/dL (ref 3.5–5.2)
BUN: 9 mg/dL (ref 6–23)
CO2: 25 mEq/L (ref 19–32)
Calcium: 9 mg/dL (ref 8.4–10.5)
Glucose, Bld: 100 mg/dL — ABNORMAL HIGH (ref 70–99)
Potassium: 3.7 mEq/L (ref 3.5–5.3)
Sodium: 141 mEq/L (ref 135–145)
Total Protein: 6.2 g/dL (ref 6.0–8.3)

## 2011-05-28 MED ORDER — SODIUM CHLORIDE 0.9 % IJ SOLN
10.0000 mL | INTRAMUSCULAR | Status: DC | PRN
  Administered 2011-05-28: 10 mL
  Filled 2011-05-28: qty 10

## 2011-05-28 MED ORDER — DEXAMETHASONE SODIUM PHOSPHATE 10 MG/ML IJ SOLN
10.0000 mg | Freq: Once | INTRAMUSCULAR | Status: AC
  Administered 2011-05-28: 10 mg via INTRAVENOUS

## 2011-05-28 MED ORDER — SODIUM CHLORIDE 0.9 % IV SOLN
Freq: Once | INTRAVENOUS | Status: AC
  Administered 2011-05-28: 10:00:00 via INTRAVENOUS

## 2011-05-28 MED ORDER — SODIUM CHLORIDE 0.9 % IV SOLN
500.0000 mg/m2 | Freq: Once | INTRAVENOUS | Status: AC
  Administered 2011-05-28: 900 mg via INTRAVENOUS
  Filled 2011-05-28: qty 36

## 2011-05-28 MED ORDER — ONDANSETRON 8 MG/50ML IVPB (CHCC)
8.0000 mg | Freq: Once | INTRAVENOUS | Status: AC
  Administered 2011-05-28: 8 mg via INTRAVENOUS

## 2011-05-28 MED ORDER — HEPARIN SOD (PORK) LOCK FLUSH 100 UNIT/ML IV SOLN
500.0000 [IU] | Freq: Once | INTRAVENOUS | Status: AC | PRN
  Administered 2011-05-28: 500 [IU]
  Filled 2011-05-28: qty 5

## 2011-05-28 NOTE — Telephone Encounter (Signed)
Per staff message from [place], I have scheduled the patient.    JMW

## 2011-05-28 NOTE — Telephone Encounter (Signed)
appts made and email to mw to move down tx on 6/11 and print for pt aom,pt aware

## 2011-05-30 NOTE — Progress Notes (Signed)
[facility] Telephone:([phone]) [phone]   Fax:([phone]  OFFICE PROGRESS NOTE  [name], MD, MD [address] [place] [address]  DIAGNOSIS: Metastatic non-small cell lung cancer initially diagnosed with locally advanced stage IIIB with right Pancoast tumor involving the vertebral body as well as the foraminal canal invasion with spinal cord compression in October of 2007.   PRIOR THERAPY:  1. Status post concurrent chemoradiation with weekly carboplatin and paclitaxel, last dose was given November 18, 2005. 2. 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 3. Status post gamma knife radiotherapy to a solitary brain lesion located in the superior frontal area of the brain at [facility] in April of 2008. 4. Status post palliative radiotherapy to the lateral abdominal wall metastatic lesion under the care of Dr. Mitzi Hansen, completed March of 2009. 5. Status post 6 cycles of systemic chemotherapy with carboplatin and Alimta. Last dose was given July 26, 2007 with disease stabilization.  CURRENT THERAPY: : Maintenance chemotherapy with Alimta 500 mg per meter squared given every 3 weeks. The patient is status post 64 cycles.   INTERVAL HISTORY: Danielle [name] 61 y.o. female returns to the clinic today for followup visit. She reports cold symptoms for the past week accompanied by low-grade temperatures. She reports that is primarily head cold she is been primarily using Tylenol. The secretion she's been producing are all clear. She voiced no other complaints. She does not require any prescription refills today. She denied having any significant chest pain or shortness of breath, no cough or hemoptysis. No weight loss or night sweats. She continues to tolerate her maintenance chemotherapy with single agent Alimta without difficulty.  MEDICAL HISTORY: Past Medical  History  Diagnosis Date  . Hypertension   . Anemia   . H/O: pneumonia   . History of tobacco abuse quit 10/08    on nicotine patch  . Fibromyalgia   . Hypokalemia   . DJD (degenerative joint disease), cervical   . Thrush 12/11/2010  . 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 had chemo and radiation for lung ca    ALLERGIES:  is allergic to contrast media; co-trimoxazole injection; iohexol; and sulfa drugs cross reactors.  MEDICATIONS:  Current Outpatient Prescriptions  Medication Sig Dispense Refill  . ALPRAZolam (XANAX) 0.25 MG tablet Take 1 tablet (0.25 mg total) by mouth at bedtime as needed.  30 tablet  0  . amLODipine (NORVASC) 10 MG tablet TAKE 1 TABLET EVERY DAY  90 tablet  1  . antipyrine-benzocaine (AURALGAN) otic solution Place 3 drops into both ears every 2 (two) hours as needed.        . folic acid (FOLVITE) 1 MG tablet TAKE 1 TABLET BY MOUTH EVERY DAY  30 tablet  2  . lidocaine-prilocaine (EMLA) cream Apply topically as needed.  30 g  0  . lisinopril (PRINIVIL,ZESTRIL) 5 MG tablet Take 1 tablet (5 mg total) by mouth daily.  90 tablet  0  . morphine (MS CONTIN) 60 MG 12 hr tablet Take 1 tablet (60 mg total) by mouth 2 (two) times daily. Every 12 hours  60 tablet  0  . morphine (MSIR) 30 MG tablet Take 1 tablet (30 mg total) by mouth every 4 (four) hours as needed. Take 1 tab every 4-6 hours as needed for breakthrough pain  60 tablet  0  .  ondansetron (ZOFRAN) 8 MG tablet Take 1 tablet (8 mg total) by mouth every 8 (eight) hours as needed for nausea.  30 tablet  1  . PATANOL 0.1 % ophthalmic solution PUT 1 DROP IN AFFECTED EYE(S) 2X A DAY  5 mL  1  . prochlorperazine (COMPAZINE) 10 MG tablet Take 10 mg by mouth every 6 (six) hours as needed.        . simvastatin (ZOCOR) 40 MG tablet 1 TAB BY MOUTH DAILY  30 tablet  10    SURGICAL HISTORY:  Past Surgical History  Procedure Date  . Cervical laminectomy 1995  . Other surgical history 2008     Gamma knife surgery to remove brain met   . 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 Knee surgery 1990    Left x 2  . Portacath placement -[name]    TIP IN CAVOATRIAL JUNCTION    REVIEW OF SYSTEMS:  A comprehensive review of systems was negative except for: Constitutional: positive for Low-grade temperatures Ears, nose, mouth, throat, and face: positive for nasal congestion and Head congestion Respiratory: positive for cough   PHYSICAL EXAMINATION: General appearance: alert, cooperative and no distress Head: Normocephalic, without obvious abnormality, atraumatic Neck: no adenopathy Lymph nodes: Cervical, supraclavicular, and axillary nodes normal. Resp: clear to auscultation bilaterally Cardio: regular rate and rhythm, S1, S2 normal, no murmur, click, rub or gallop GI: soft, non-tender; bowel sounds normal; no masses,  no organomegaly Extremities: extremities normal, atraumatic, no cyanosis or edema Neurologic: Alert and oriented X 3, normal strength and tone. Normal symmetric reflexes. Normal coordination and gait  ECOG PERFORMANCE STATUS: 1 - Symptomatic but completely ambulatory  Blood pressure 129/75, pulse 91, temperature 97.4 F (36.3 C), temperature source Oral, height 5' 3.75" (1.619 m), weight 158 lb 9.6 oz (71.94 kg).  LABORATORY DATA: Lab Results  Component Value Date   WBC 11.2* 05/28/2011   HGB 11.8 05/28/2011   HCT 35.4 05/28/2011   MCV 87.4 05/28/2011   PLT 369 05/28/2011      Chemistry      Component Value Date/Time   NA 141 05/28/2011 0837   K 3.7 05/28/2011 0837   CL 107 05/28/2011 0837   CO2 25 05/28/2011 0837   BUN 9 05/28/2011 0837   CREATININE 0.55 05/28/2011 0837      Component Value Date/Time   CALCIUM 9.0 05/28/2011 0837   ALKPHOS 79 05/28/2011 0837   AST 15 05/28/2011 0837   ALT 10 05/28/2011 0837   BILITOT 0.4 05/28/2011 0837       RADIOGRAPHIC STUDIES: Ct Chest W Contrast  05/03/2011  *RADIOLOGY REPORT*  Clinical  Data:  Metastatic lung cancer to spine, brain, and omentum.  Diagnosed in 2007.  Gamma knife complete.  Chemotherapy in progress.  No current complaints.  CT CHEST, ABDOMEN AND PELVIS WITH CONTRAST  Technique: Contiguous axial images of the chest abdomen and pelvis were obtained after IV contrast administration.  Contrast: 100  ml Omnipaque-300  Comparison: 03/01/2011   CT CHEST  Findings: Lung windows demonstrate moderate centrilobular emphysema.  Probable secretions within a left lower lobe segmental bronchus on image 32.  Not present on the prior.  Similar probable scarring within the anterior right upper lobe on image 17.  Scattered areas of interstitial thickening within the right upper lobe.  Right apical pleural based opacity is unchanged, and measures 1.7 x 1.4 cm on image 8.  Similar in appearance back to 04/16/2010.  Left lung clear.  Soft tissue windows demonstrate a right-sided Port-A-Cath which terminates at the mid right atrium. No supraclavicular adenopathy. Normal heart size without pericardial or pleural effusion. Proximal LAD atherosclerosis. No central pulmonary embolism, on this non-dedicated study.  No mediastinal or hilar adenopathy.  IMPRESSION:  1.  Similar appearance of the chest, without evidence of disease progression. 2.  Probable scarring at the medial right apex with moderate centrilobular emphysema and other areas of interstitial thickening throughout the right upper lobe. 3.  Probable secretions within the left lower lobe endobronchial tree.  New since the prior. Recommend attention on follow-up. 4. Age advanced coronary artery atherosclerosis.  Recommend assessment of coronary risk factors and consideration of medical therapy.   CT ABDOMEN AND PELVIS  Findings:  Normal liver, spleen, stomach, pancreas, gallbladder, biliary tract, adrenal glands.  Lower pole left renal lesion is too small to characterize but likely a cyst.  Subtle right proximal periureteric edema is suspected on  image 73.  This is similar on the prior exam.  No retroperitoneal or retrocrural adenopathy.  Normal colon, appendix, and terminal ileum.  Normal small bowel without abdominal ascites.    No pelvic adenopathy.    Normal urinary bladder and uterus.  No adnexal mass.  No significant free fluid.  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 More subtle findings on the right.  Prior vertebral augmentation at T4 and T7.  T3 vertebral plana again identified.  IMPRESSION:  1.  No acute process or evidence of metastatic disease in the abdomen or pelvis. 2.  Subtle edema suspected about the proximal right ureter. Chronic finding.  Correlate with any symptoms of right-sided urinary tract infection. 3.  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  Original Report Authenticated By: Consuello Bossier, M.D.    ASSESSMENT/PLAN: This is a very pleasant 61 years old white female with metastatic non-small cell lung cancer count on maintenance chemotherapy with Alimta status post 64 cycles. The patient is doing fine and continues to tolerate  her treatment fairly well. She has no evidence for disease progression on her recent scans. Patient was discussed with Dr. Arbutus Ped. She will proceed with cycle #65 of her maintenance chemotherapy with single agent Alimta at 500 mg router squared given every 3 weeks. She'll return in 3 weeks with repeat CBC differential and C. met prior to cycle #66 of her maintenance chemotherapy with single agent Alimta. She may try an over-the-counter cold and sinus preparation for her current symptom complex. If these measures are ineffective for her symptom complex persists or worsens she is to notify us. She will notify our office and she is due for her medication refills.  Laural Benes, Aydrian Halpin E, PA-C   All questions were answered. The patient knows to call the clinic with any problems, questions or concerns. We can certainly see the patient much sooner if necessary.  I spent 20  minutes counseling the patient face to face. The total time spent in the appointment was 30 minutes.

## 2011-05-31 ENCOUNTER — Telehealth: Payer: Self-pay | Admitting: Internal Medicine

## 2011-05-31 NOTE — Telephone Encounter (Signed)
l/m with june appts   aom

## 2011-06-12 ENCOUNTER — Other Ambulatory Visit: Payer: Self-pay | Admitting: *Deleted

## 2011-06-12 DIAGNOSIS — C3490 Malignant neoplasm of unspecified part of unspecified bronchus or lung: Secondary | ICD-10-CM

## 2011-06-12 MED ORDER — MORPHINE SULFATE ER 60 MG PO TBCR: 60.0000 mg | EXTENDED_RELEASE_TABLET | Freq: Two times a day (BID) | ORAL | Status: DC

## 2011-06-12 MED ORDER — ALPRAZOLAM 0.25 MG PO TABS: 0.2500 mg | ORAL_TABLET | Freq: Every evening | ORAL | Status: DC | PRN

## 2011-06-12 MED ORDER — MORPHINE SULFATE 30 MG PO TABS: 30.0000 mg | ORAL_TABLET | ORAL | Status: DC | PRN

## 2011-06-18 ENCOUNTER — Ambulatory Visit (HOSPITAL_BASED_OUTPATIENT_CLINIC_OR_DEPARTMENT_OTHER): Payer: Medicare Other | Admitting: Physician Assistant

## 2011-06-18 ENCOUNTER — Encounter: Payer: Self-pay | Admitting: Physician Assistant

## 2011-06-18 ENCOUNTER — Other Ambulatory Visit (HOSPITAL_BASED_OUTPATIENT_CLINIC_OR_DEPARTMENT_OTHER): Payer: Medicare Other | Admitting: Lab

## 2011-06-18 ENCOUNTER — Telehealth: Payer: Self-pay | Admitting: *Deleted

## 2011-06-18 ENCOUNTER — Ambulatory Visit (HOSPITAL_BASED_OUTPATIENT_CLINIC_OR_DEPARTMENT_OTHER): Payer: Medicare Other

## 2011-06-18 VITALS — BP 127/77 | HR 81 | Temp 97.1°F | Ht 63.75 in | Wt 158.3 lb

## 2011-06-18 DIAGNOSIS — C7951 Secondary malignant neoplasm of bone: Secondary | ICD-10-CM

## 2011-06-18 DIAGNOSIS — C3490 Malignant neoplasm of unspecified part of unspecified bronchus or lung: Secondary | ICD-10-CM | POA: Diagnosis not present

## 2011-06-18 DIAGNOSIS — C3410 Malignant neoplasm of upper lobe, unspecified bronchus or lung: Secondary | ICD-10-CM

## 2011-06-18 DIAGNOSIS — Z5111 Encounter for antineoplastic chemotherapy: Secondary | ICD-10-CM

## 2011-06-18 LAB — COMPREHENSIVE METABOLIC PANEL
BUN: 9 mg/dL (ref 6–23)
CO2: 27 mEq/L (ref 19–32)
Creatinine, Ser: 0.71 mg/dL (ref 0.50–1.10)
Glucose, Bld: 140 mg/dL — ABNORMAL HIGH (ref 70–99)
Sodium: 140 mEq/L (ref 135–145)
Total Bilirubin: 0.2 mg/dL — ABNORMAL LOW (ref 0.3–1.2)
Total Protein: 6.9 g/dL (ref 6.0–8.3)

## 2011-06-18 LAB — CBC WITH DIFFERENTIAL/PLATELET
Basophils Absolute: 0 10*3/uL (ref 0.0–0.1)
Eosinophils Absolute: 0.1 10*3/uL (ref 0.0–0.5)
HCT: 37.2 % (ref 34.8–46.6)
HGB: 12.6 g/dL (ref 11.6–15.9)
LYMPH%: 30.4 % (ref 14.0–49.7)
MONO#: 0.7 10*3/uL (ref 0.1–0.9)
NEUT#: 3.3 10*3/uL (ref 1.5–6.5)
NEUT%: 56 % (ref 38.4–76.8)
Platelets: 395 10*3/uL (ref 145–400)
WBC: 6 10*3/uL (ref 3.9–10.3)

## 2011-06-18 MED ORDER — SODIUM CHLORIDE 0.9 % IJ SOLN
10.0000 mL | INTRAMUSCULAR | Status: DC | PRN
  Administered 2011-06-18: 10 mL
  Filled 2011-06-18: qty 10

## 2011-06-18 MED ORDER — HEPARIN SOD (PORK) LOCK FLUSH 100 UNIT/ML IV SOLN
500.0000 [IU] | Freq: Once | INTRAVENOUS | Status: AC | PRN
  Administered 2011-06-18: 500 [IU]
  Filled 2011-06-18: qty 5

## 2011-06-18 MED ORDER — SODIUM CHLORIDE 0.9 % IV SOLN
Freq: Once | INTRAVENOUS | Status: AC
  Administered 2011-06-18: 11:00:00 via INTRAVENOUS

## 2011-06-18 MED ORDER — CYANOCOBALAMIN 1000 MCG/ML IJ SOLN
1000.0000 ug | Freq: Once | INTRAMUSCULAR | Status: AC
  Administered 2011-06-18: 1000 ug via INTRAMUSCULAR

## 2011-06-18 MED ORDER — SODIUM CHLORIDE 0.9 % IV SOLN
500.0000 mg/m2 | Freq: Once | INTRAVENOUS | Status: AC
  Administered 2011-06-18: 900 mg via INTRAVENOUS
  Filled 2011-06-18: qty 36

## 2011-06-18 MED ORDER — FLUCONAZOLE 100 MG PO TABS: ORAL_TABLET | ORAL | Status: DC

## 2011-06-18 MED ORDER — ONDANSETRON 8 MG/50ML IVPB (CHCC)
8.0000 mg | Freq: Once | INTRAVENOUS | Status: AC
  Administered 2011-06-18: 8 mg via INTRAVENOUS

## 2011-06-18 MED ORDER — DEXAMETHASONE SODIUM PHOSPHATE 10 MG/ML IJ SOLN
10.0000 mg | Freq: Once | INTRAMUSCULAR | Status: AC
  Administered 2011-06-18: 10 mg via INTRAVENOUS

## 2011-06-18 NOTE — Telephone Encounter (Signed)
Per staff message I have adjusted treatment appt on 7/1.   JMW

## 2011-06-18 NOTE — Progress Notes (Signed)
[facility] Telephone:(336) [phone]   Fax:(336) [phone]  OFFICE PROGRESS NOTE  [name], MD, MD [address] [place] [address]  DIAGNOSIS: Metastatic non-small cell lung cancer initially diagnosed with locally advanced stage IIIB with right Pancoast tumor involving the vertebral body as well as the foraminal canal invasion with spinal cord compression in October of 2007.   PRIOR THERAPY:  1. Status post concurrent chemoradiation with weekly carboplatin and paclitaxel, last dose was given November 18, 2005. 2. 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 3. Status post gamma knife radiotherapy to a solitary brain lesion located in the superior frontal area of the brain at [facility] in April of 2008. 4. Status post palliative radiotherapy to the lateral abdominal wall metastatic lesion under the care of Dr. Mitzi Hansen, completed March of 2009. 5. Status post 6 cycles of systemic chemotherapy with carboplatin and Alimta. Last dose was given July 26, 2007 with disease stabilization.  CURRENT THERAPY: : Maintenance chemotherapy with Alimta 500 mg per meter squared given every 3 weeks. The patient is status post 65 cycles.   INTERVAL HISTORY: Danielle [name] 61 y.o. female returns to the clinic today for followup visit. She complains of right ear discomfort for the past few days not associated with any fever or chills. She continues to tolerate her maintenance chemotherapy with single agent Alimta without significant difficulty. She denies any other complaints.  She does not require any prescription refills today. She denied having any significant chest pain or shortness of breath, no cough or hemoptysis. No weight loss or night sweats.   MEDICAL HISTORY: Past Medical History  Diagnosis Date  . Hypertension   . Anemia   . H/O: pneumonia   . History of  tobacco abuse quit 10/08    on nicotine patch  . Fibromyalgia   . Hypokalemia   . DJD (degenerative joint disease), cervical   . Thrush 12/11/2010  . 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 had chemo and radiation for lung ca    ALLERGIES:  is allergic to contrast media; co-trimoxazole injection; iohexol; and sulfa drugs cross reactors.  MEDICATIONS:  Current Outpatient Prescriptions  Medication Sig Dispense Refill  . ALPRAZolam (XANAX) 0.25 MG tablet Take 1 tablet (0.25 mg total) by mouth at bedtime as needed.  30 tablet  0  . amLODipine (NORVASC) 10 MG tablet TAKE 1 TABLET EVERY DAY  90 tablet  1  . antipyrine-benzocaine (AURALGAN) otic solution Place 3 drops into both ears every 2 (two) hours as needed.        . fluconazole (DIFLUCAN) 100 MG tablet Take 2 tablets by mouth on day one, then take 1 tablet by mouth daily until completed  16 tablet  0  . folic acid (FOLVITE) 1 MG tablet TAKE 1 TABLET BY MOUTH EVERY DAY  30 tablet  2  . lidocaine-prilocaine (EMLA) cream Apply topically as needed.  30 g  0  . lisinopril (PRINIVIL,ZESTRIL) 5 MG tablet Take 1 tablet (5 mg total) by mouth daily.  90 tablet  0  . morphine (MS CONTIN) 60 MG 12 hr tablet Take 1 tablet (60 mg total) by mouth 2 (two) times daily.  60 tablet  0  . morphine (MSIR) 30 MG tablet Take 1 tablet (30 mg total) by mouth every 4 (four) hours as needed. Take 1  tab every 4-6 hours as needed for breakthrough pain  60 tablet  0  . ondansetron (ZOFRAN) 8 MG tablet Take 1 tablet (8 mg total) by mouth every 8 (eight) hours as needed for nausea.  30 tablet  1  . PATANOL 0.1 % ophthalmic solution PUT 1 DROP IN AFFECTED EYE(S) 2X A DAY  5 mL  1  . prochlorperazine (COMPAZINE) 10 MG tablet Take 10 mg by mouth every 6 (six) hours as needed.        . simvastatin (ZOCOR) 40 MG tablet 1 TAB BY MOUTH DAILY  30 tablet  10    SURGICAL HISTORY:  Past Surgical History  Procedure Date  . Cervical laminectomy 1995    . Other surgical history 2008    Gamma knife surgery to remove brain met   . 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 Knee surgery 1990    Left x 2  . Portacath placement -[name]    TIP IN CAVOATRIAL JUNCTION    REVIEW OF SYSTEMS:  A comprehensive review of systems was negative except for: Ears, nose, mouth, throat, and face: positive for earaches   PHYSICAL EXAMINATION: General appearance: alert, cooperative and no distress Head: Normocephalic, without obvious abnormality, atraumatic Neck: no adenopathy Lymph nodes: Cervical, supraclavicular, and axillary nodes normal. Resp: clear to auscultation bilaterally Cardio: regular rate and rhythm, S1, S2 normal, no murmur, click, rub or gallop GI: soft, non-tender; bowel sounds normal; no masses,  no organomegaly Extremities: extremities normal, atraumatic, no cyanosis or edema Neurologic: Alert and oriented X 3, normal strength and tone. Normal symmetric reflexes. Normal coordination and gait TMs: All bilaterally but more so on the right than on the left, no erythema or bulging of the tympanic membranes noted bilaterally Mouth: reveals mild thrush ECOG PERFORMANCE STATUS: 1 - Symptomatic but completely ambulatory  Blood pressure 127/77, pulse 81, temperature 97.1 F (36.2 C), temperature source Oral, height 5' 3.75" (1.619 m), weight 158 lb 4.8 oz (71.804 kg).  LABORATORY DATA: Lab Results  Component Value Date   WBC 6.0 06/18/2011   HGB 12.6 06/18/2011   HCT 37.2 06/18/2011   MCV 88.1 06/18/2011   PLT 395 06/18/2011      Chemistry      Component Value Date/Time   NA 141 05/28/2011 0837   K 3.7 05/28/2011 0837   CL 107 05/28/2011 0837   CO2 25 05/28/2011 0837   BUN 9 05/28/2011 0837   CREATININE 0.55 05/28/2011 0837      Component Value Date/Time   CALCIUM 9.0 05/28/2011 0837   ALKPHOS 79 05/28/2011 0837   AST 15 05/28/2011 0837   ALT 10 05/28/2011 0837   BILITOT 0.4 05/28/2011 0837        RADIOGRAPHIC STUDIES: Ct Chest W Contrast  05/03/2011  *RADIOLOGY REPORT*  Clinical Data:  Metastatic lung cancer to spine, brain, and omentum.  Diagnosed in 2007.  Gamma knife complete.  Chemotherapy in progress.  No current complaints.  CT CHEST, ABDOMEN AND PELVIS WITH CONTRAST  Technique: Contiguous axial images of the chest abdomen and pelvis were obtained after IV contrast administration.  Contrast: 100  ml Omnipaque-300  Comparison: 03/01/2011   CT CHEST  Findings: Lung windows demonstrate moderate centrilobular emphysema.  Probable secretions within a left lower lobe segmental bronchus on image 32.  Not present on the prior.  Similar probable scarring within the anterior right upper lobe on image 17.  Scattered areas of interstitial  thickening within the right upper lobe.  Right apical pleural based opacity is unchanged, and measures 1.7 x 1.4 cm on image 8.  Similar in appearance back to 04/16/2010.  Left lung clear.  Soft tissue windows demonstrate a right-sided Port-A-Cath which terminates at the mid right atrium. No supraclavicular adenopathy. Normal heart size without pericardial or pleural effusion. Proximal LAD atherosclerosis. No central pulmonary embolism, on this non-dedicated study.  No mediastinal or hilar adenopathy.  IMPRESSION:  1.  Similar appearance of the chest, without evidence of disease progression. 2.  Probable scarring at the medial right apex with moderate centrilobular emphysema and other areas of interstitial thickening throughout the right upper lobe. 3.  Probable secretions within the left lower lobe endobronchial tree.  New since the prior. Recommend attention on follow-up. 4. Age advanced coronary artery atherosclerosis.  Recommend assessment of coronary risk factors and consideration of medical therapy.   CT ABDOMEN AND PELVIS  Findings:  Normal liver, spleen, stomach, pancreas, gallbladder, biliary tract, adrenal glands.  Lower pole left renal lesion is too small  to characterize but likely a cyst.  Subtle right proximal periureteric edema is suspected on image 73.  This is similar on the prior exam.  No retroperitoneal or retrocrural adenopathy.  Normal colon, appendix, and terminal ileum.  Normal small bowel without abdominal ascites.    No pelvic adenopathy.    Normal urinary bladder and uterus.  No adnexal mass.  No significant free fluid.  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 More subtle findings on the right.  Prior vertebral augmentation at T4 and T7.  T3 vertebral plana again identified.  IMPRESSION:  1.  No acute process or evidence of metastatic disease in the abdomen or pelvis. 2.  Subtle edema suspected about the proximal right ureter. Chronic finding.  Correlate with any symptoms of right-sided urinary tract infection. 3.  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  Original Report Authenticated By: Consuello Bossier, M.D.    ASSESSMENT/PLAN: This is a very pleasant 61 years old white female with metastatic non-small cell lung cancer count on maintenance chemotherapy with Alimta status post 65 cycles. The patient is doing fine and continues to tolerate  her treatment fairly well. She had no evidence for disease progression on her last scans. Patient was discussed with Dr. Arbutus Ped. She will proceed with cycle #66 of her maintenance chemotherapy with single agent Alimta at 500 mg router squared given every 3 weeks. To address the oral candidiasis a prescription for Diflucan was sent to her pharmacy of record via [facility].she'll followup with Dr. Arbutus Ped in 3 weeks with repeat CBC differential and C. Met, as well as a CT of the chest abdomen and pelvis with contrast to reevaluate her disease prior to cycle #67 of her maintenance chemotherapy with single agent Alimta. She may try an over-the-counter decongestant for her ear discomfort if this does not take care of her symptoms she will see her primary care physician for further  evaluation and management. The patient has her premedication prednisone to be taken 13 hours 7 hours and 1 hour prior to her CT scan with contrast in addition the 50 mg of Benadryl also taken one hour prior to the CT scan.  Laural Benes, Quaneisha Hanisch E, PA-C   All questions were answered. The patient knows to call the clinic with any problems, questions or concerns. We can certainly see the patient much sooner if necessary.  I spent 20 minutes counseling the patient  face to face. The total time spent in the appointment was 30 minutes.

## 2011-06-20 ENCOUNTER — Other Ambulatory Visit: Payer: Self-pay | Admitting: Physician Assistant

## 2011-06-20 DIAGNOSIS — R112 Nausea with vomiting, unspecified: Secondary | ICD-10-CM

## 2011-07-05 ENCOUNTER — Ambulatory Visit (HOSPITAL_COMMUNITY)
Admission: RE | Admit: 2011-07-05 | Discharge: 2011-07-05 | Disposition: A | Discharge status: Home / Self Care | Payer: Medicare Other | Source: Ambulatory Visit | Attending: Physician Assistant | Admitting: Physician Assistant

## 2011-07-05 DIAGNOSIS — C3490 Malignant neoplasm of unspecified part of unspecified bronchus or lung: Secondary | ICD-10-CM

## 2011-07-05 DIAGNOSIS — C7952 Secondary malignant neoplasm of bone marrow: Secondary | ICD-10-CM | POA: Insufficient documentation

## 2011-07-05 DIAGNOSIS — Z923 Personal history of irradiation: Secondary | ICD-10-CM | POA: Diagnosis not present

## 2011-07-05 DIAGNOSIS — C7931 Secondary malignant neoplasm of brain: Secondary | ICD-10-CM | POA: Diagnosis not present

## 2011-07-05 DIAGNOSIS — C7951 Secondary malignant neoplasm of bone: Secondary | ICD-10-CM | POA: Diagnosis not present

## 2011-07-05 DIAGNOSIS — C786 Secondary malignant neoplasm of retroperitoneum and peritoneum: Secondary | ICD-10-CM | POA: Insufficient documentation

## 2011-07-05 DIAGNOSIS — I2510 Atherosclerotic heart disease of native coronary artery without angina pectoris: Secondary | ICD-10-CM | POA: Insufficient documentation

## 2011-07-05 DIAGNOSIS — J984 Other disorders of lung: Secondary | ICD-10-CM | POA: Diagnosis not present

## 2011-07-05 DIAGNOSIS — C7949 Secondary malignant neoplasm of other parts of nervous system: Secondary | ICD-10-CM | POA: Insufficient documentation

## 2011-07-05 DIAGNOSIS — J438 Other emphysema: Secondary | ICD-10-CM | POA: Insufficient documentation

## 2011-07-05 MED ORDER — IOHEXOL 300 MG/ML  SOLN
100.0000 mL | Freq: Once | INTRAMUSCULAR | Status: AC | PRN
  Administered 2011-07-05: 100 mL via INTRAVENOUS

## 2011-07-09 ENCOUNTER — Other Ambulatory Visit (HOSPITAL_BASED_OUTPATIENT_CLINIC_OR_DEPARTMENT_OTHER): Payer: Medicare Other | Admitting: Lab

## 2011-07-09 ENCOUNTER — Encounter: Payer: Self-pay | Admitting: *Deleted

## 2011-07-09 ENCOUNTER — Telehealth: Payer: Self-pay | Admitting: Internal Medicine

## 2011-07-09 ENCOUNTER — Ambulatory Visit (HOSPITAL_BASED_OUTPATIENT_CLINIC_OR_DEPARTMENT_OTHER): Payer: Medicare Other | Admitting: Internal Medicine

## 2011-07-09 ENCOUNTER — Ambulatory Visit (HOSPITAL_BASED_OUTPATIENT_CLINIC_OR_DEPARTMENT_OTHER): Payer: Medicare Other

## 2011-07-09 VITALS — BP 117/77 | HR 84 | Temp 97.5°F | Ht 63.75 in | Wt 156.7 lb

## 2011-07-09 DIAGNOSIS — C7951 Secondary malignant neoplasm of bone: Secondary | ICD-10-CM

## 2011-07-09 DIAGNOSIS — C3490 Malignant neoplasm of unspecified part of unspecified bronchus or lung: Secondary | ICD-10-CM

## 2011-07-09 DIAGNOSIS — C7952 Secondary malignant neoplasm of bone marrow: Secondary | ICD-10-CM

## 2011-07-09 DIAGNOSIS — C3410 Malignant neoplasm of upper lobe, unspecified bronchus or lung: Secondary | ICD-10-CM

## 2011-07-09 DIAGNOSIS — C7949 Secondary malignant neoplasm of other parts of nervous system: Secondary | ICD-10-CM

## 2011-07-09 DIAGNOSIS — C7931 Secondary malignant neoplasm of brain: Secondary | ICD-10-CM

## 2011-07-09 DIAGNOSIS — Z5111 Encounter for antineoplastic chemotherapy: Secondary | ICD-10-CM | POA: Diagnosis not present

## 2011-07-09 LAB — CBC WITH DIFFERENTIAL/PLATELET
Eosinophils Absolute: 0 10*3/uL (ref 0.0–0.5)
HCT: 37.9 % (ref 34.8–46.6)
LYMPH%: 23.1 % (ref 14.0–49.7)
MONO#: 0.9 10*3/uL (ref 0.1–0.9)
NEUT#: 4.8 10*3/uL (ref 1.5–6.5)
NEUT%: 64.1 % (ref 38.4–76.8)
Platelets: 310 10*3/uL (ref 145–400)
RBC: 4.23 10*6/uL (ref 3.70–5.45)
WBC: 7.6 10*3/uL (ref 3.9–10.3)
lymph#: 1.7 10*3/uL (ref 0.9–3.3)

## 2011-07-09 LAB — COMPREHENSIVE METABOLIC PANEL
CO2: 26 mEq/L (ref 19–32)
Calcium: 9.7 mg/dL (ref 8.4–10.5)
Chloride: 106 mEq/L (ref 96–112)
Glucose, Bld: 153 mg/dL — ABNORMAL HIGH (ref 70–99)
Sodium: 140 mEq/L (ref 135–145)
Total Bilirubin: 0.3 mg/dL (ref 0.3–1.2)
Total Protein: 6.7 g/dL (ref 6.0–8.3)

## 2011-07-09 MED ORDER — HEPARIN SOD (PORK) LOCK FLUSH 100 UNIT/ML IV SOLN
500.0000 [IU] | Freq: Once | INTRAVENOUS | Status: AC | PRN
  Administered 2011-07-09: 500 [IU]
  Filled 2011-07-09: qty 5

## 2011-07-09 MED ORDER — SODIUM CHLORIDE 0.9 % IV SOLN
500.0000 mg/m2 | Freq: Once | INTRAVENOUS | Status: AC
  Administered 2011-07-09: 900 mg via INTRAVENOUS
  Filled 2011-07-09: qty 36

## 2011-07-09 MED ORDER — SODIUM CHLORIDE 0.9 % IV SOLN
Freq: Once | INTRAVENOUS | Status: AC
  Administered 2011-07-09: 10:00:00 via INTRAVENOUS

## 2011-07-09 MED ORDER — DEXAMETHASONE SODIUM PHOSPHATE 10 MG/ML IJ SOLN
10.0000 mg | Freq: Once | INTRAMUSCULAR | Status: AC
  Administered 2011-07-09: 10 mg via INTRAVENOUS

## 2011-07-09 MED ORDER — ONDANSETRON 8 MG/50ML IVPB (CHCC)
8.0000 mg | Freq: Once | INTRAVENOUS | Status: AC
  Administered 2011-07-09: 8 mg via INTRAVENOUS

## 2011-07-09 MED ORDER — SODIUM CHLORIDE 0.9 % IJ SOLN
10.0000 mL | INTRAMUSCULAR | Status: DC | PRN
  Administered 2011-07-09: 10 mL
  Filled 2011-07-09: qty 10

## 2011-07-09 NOTE — Progress Notes (Signed)
[facility] Telephone:(336) [phone]   Fax:(336) [phone]  OFFICE PROGRESS NOTE  Danielle Nora, MD [address] [place] [address]  DIAGNOSIS: Metastatic non-small cell lung cancer initially diagnosed with locally advanced stage IIIB with right Pancoast tumor involving the vertebral body as well as the foraminal canal invasion with spinal cord compression in October of 2007.   PRIOR THERAPY:  1. Status post concurrent chemoradiation with weekly carboplatin and paclitaxel, last dose was given November 18, 2005. 2. 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 3. Status post gamma knife radiotherapy to a solitary brain lesion located in the superior frontal area of the brain at [facility] in April of 2008. 4. Status post palliative radiotherapy to the lateral abdominal wall metastatic lesion under the care of Dr. Mitzi Hansen, completed March of 2009. 5. Status post 6 cycles of systemic chemotherapy with carboplatin and Alimta. Last dose was given July 26, 2007 with disease stabilization.  CURRENT THERAPY: : Maintenance chemotherapy with Alimta 500 mg per meter squared given every 3 weeks. The patient is status post 66 cycles.   INTERVAL HISTORY: Danielle [name] 61 y.o. female returns to the clinic today for followup visit accompanied by her sister. The patient is feeling fine today with no specific complaints. She is tolerating her treatment with maintenance Alimta fairly well. She denied having any significant chest pain or shortness breath, no cough or hemoptysis. No significant weight loss or night sweats. She has repeat CT scan of the chest, abdomen and pelvis performed recently and she is here today for evaluation and discussion of her scan results  MEDICAL HISTORY: Past Medical History  Diagnosis Date  . Hypertension   . Anemia   . H/O: pneumonia   . History  of tobacco abuse quit 10/08    on nicotine patch  . Fibromyalgia   . Hypokalemia   . DJD (degenerative joint disease), cervical   . Thrush 12/11/2010  . 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 had chemo and radiation for lung ca    ALLERGIES:  is allergic to contrast media; co-trimoxazole injection; iohexol; and sulfa drugs cross reactors.  MEDICATIONS:  Current Outpatient Prescriptions  Medication Sig Dispense Refill  . ALPRAZolam (XANAX) 0.25 MG tablet Take 1 tablet (0.25 mg total) by mouth at bedtime as needed.  30 tablet  0  . amLODipine (NORVASC) 10 MG tablet TAKE 1 TABLET EVERY DAY  90 tablet  1  . antipyrine-benzocaine (AURALGAN) otic solution Place 3 drops into both ears every 2 (two) hours as needed.        . fluconazole (DIFLUCAN) 100 MG tablet Take 2 tablets by mouth on day one, then take 1 tablet by mouth daily until completed  16 tablet  0  . folic acid (FOLVITE) 1 MG tablet TAKE 1 TABLET BY MOUTH EVERY DAY  30 tablet  2  . lidocaine-prilocaine (EMLA) cream Apply topically as needed.  30 g  0  . lisinopril (PRINIVIL,ZESTRIL) 5 MG tablet Take 1 tablet (5 mg total) by mouth daily.  90 tablet  0  . morphine (MS CONTIN) 60 MG 12 hr tablet Take 1 tablet (60 mg total) by mouth 2 (two) times daily.  60 tablet  0  . morphine (MSIR) 30 MG tablet Take 1 tablet (30 mg total) by mouth every 4 (four) hours as needed.  Take 1 tab every 4-6 hours as needed for breakthrough pain  60 tablet  0  . ondansetron (ZOFRAN) 8 MG tablet TAKE 1 TABLET (8 MG TOTAL) BY MOUTH EVERY 8 (EIGHT) HOURS AS NEEDED FOR NAUSEA.  30 tablet  1  . PATANOL 0.1 % ophthalmic solution PUT 1 DROP IN AFFECTED EYE(S) 2X A DAY  5 mL  1  . prochlorperazine (COMPAZINE) 10 MG tablet Take 10 mg by mouth every 6 (six) hours as needed.        . simvastatin (ZOCOR) 40 MG tablet 1 TAB BY MOUTH DAILY  30 tablet  10    SURGICAL HISTORY:  Past Surgical History  Procedure Date  . Cervical laminectomy  1995  . Other surgical history 2008    Gamma knife surgery to remove brain met   . 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 Knee surgery 1990    Left x 2  . Portacath placement -[name]    TIP IN CAVOATRIAL JUNCTION    REVIEW OF SYSTEMS:  A comprehensive review of systems was negative.   PHYSICAL EXAMINATION: General appearance: alert, cooperative and no distress Neck: no adenopathy Resp: clear to auscultation bilaterally Cardio: regular rate and rhythm, S1, S2 normal, no murmur, click, rub or gallop GI: soft, non-tender; bowel sounds normal; no masses,  no organomegaly Extremities: extremities normal, atraumatic, no cyanosis or edema Neurologic: Alert and oriented X 3, normal strength and tone. Normal symmetric reflexes. Normal coordination and gait  ECOG PERFORMANCE STATUS: 0 - Asymptomatic  Blood pressure 117/77, pulse 84, temperature 97.5 F (36.4 C), temperature source Oral, height 5' 3.75" (1.619 m), weight 156 lb 11.2 oz (71.079 kg).  LABORATORY DATA: Lab Results  Component Value Date   WBC 7.6 07/09/2011   HGB 12.4 07/09/2011   HCT 37.9 07/09/2011   MCV 89.6 07/09/2011   PLT 310 07/09/2011      Chemistry      Component Value Date/Time   NA 140 06/18/2011 0906   K 4.2 06/18/2011 0906   CL 106 06/18/2011 0906   CO2 27 06/18/2011 0906   BUN 9 06/18/2011 0906   CREATININE 0.71 06/18/2011 0906      Component Value Date/Time   CALCIUM 9.3 06/18/2011 0906   ALKPHOS 87 06/18/2011 0906   AST 17 06/18/2011 0906   ALT 10 06/18/2011 0906   BILITOT 0.2* 06/18/2011 0906       RADIOGRAPHIC STUDIES: Ct Chest W Contrast  07/05/2011  *RADIOLOGY REPORT*  Clinical Data:  Metastatic lung cancer to spine, brain and omentum. Radiation therapy to left groin completed in 2009.  Ongoing chemotherapy.  Gamma knife completed 2008.  CT CHEST, ABDOMEN AND PELVIS WITH CONTRAST  Technique: Contiguous axial images of the chest abdomen and pelvis were obtained after IV contrast  administration.  Contrast: 100  ml Omnipaque-300  Comparison: 05/03/2010  CT CHEST  Findings: Lung windows demonstrate that the previously described endobronchial finding left lower lobe is resolved and was likely due to fluid/secretions.  Moderate centrilobular emphysema.  Treatment effects within the right upper lobe with areas of interstitial thickening and similar mild nodularity.  Example anteriorly on image 17.  Pleural-based opacity at the posterior right apex measures 1.4 x 1.8 cm on image 9 and is similar.  Soft tissue windows demonstrate no supraclavicular adenopathy.  A right-sided Port-A-Cath which terminates at the right atrium.  Normal heart size without pericardial or pleural effusion. Multivessel coronary artery atherosclerosis.  No central pulmonary embolism, on this non-dedicated study.  No mediastinal or hilar adenopathy.  IMPRESSION:  1. No acute process or evidence of metastatic disease in the chest. 2.  Similar post-treatment effects with scarring in the right upper lobe and apex. 3. Age advanced coronary artery atherosclerosis.  Recommend assessment of coronary risk factors and consideration of medical therapy.  CT ABDOMEN AND PELVIS  Findings:  Normal liver, spleen, stomach, pancreas, gallbladder, biliary tract, adrenal glands.  Lower pole left renal cyst.  Right proximal ureteric edema is again subtle and similar on image 72.  No retroperitoneal or retrocrural adenopathy.  Portions of the descending colon appear thick-walled.  Example image 81.  Felt to be due to underdistension.  No surrounding edema.  Normal terminal ileum and appendix.  Normal small bowel without abdominal ascites.    No evidence of omental or peritoneal disease.  No pelvic adenopathy.  Uterus is retroflexed.  Normal urinary bladder. No adnexal mass or significant free fluid.  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  Scattered tiny pelvic sclerotic lesions which are likely bone islands  and are not significantly changed.  Prior vertebral augmentation at T4 and T7.  T3 vertebral plana.  IMPRESSION:  1. No acute process or evidence of metastatic disease in the abdomen or pelvis. 2.  Similar subtle right proximal ureteric inflammation.  Chronic. 3.  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  Original Report Authenticated By: Consuello Bossier, M.D.     ASSESSMENT: This is a very pleasant 61 years old white female with metastatic non-small cell lung cancer currently on maintenance and Alimta for 66 cycles. The patient is doing fine and she has no evidence for disease progression on the recent scan   PLAN: I discussed the scan results with the patient and recommended for her to continue his maintenance treatment with the current regimen. She will start cycle #67 of Alimta today. The patient would come back for followup visit in 3 weeks with the start of the next treatment. She was advised to call me immediately if she has any concerning symptoms in the interval.   All questions were answered. The patient knows to call the clinic with any problems, questions or concerns. We can certainly see the patient much sooner if necessary.  I spent 15 minutes counseling the patient face to face. The total time spent in the appointment was 25 minutes.

## 2011-07-09 NOTE — Telephone Encounter (Signed)
gv pt appt schedule for July thru September.  °

## 2011-07-09 NOTE — Patient Instructions (Signed)
[facility] Discharge Instructions for Patients Receiving Chemotherapy  Today you received the following chemotherapy agent Alimta.  To help prevent nausea and vomiting after your treatment, we encourage you to take your nausea medication. Begin taking it as often as prescribed for by Dr. Arbutus Ped.    If you develop nausea and vomiting that is not controlled by your nausea medication, call the clinic. If it is after clinic hours your family physician or the after hours number for the clinic or go to the Emergency Department.   BELOW ARE SYMPTOMS THAT SHOULD BE REPORTED IMMEDIATELY:  *FEVER GREATER THAN 100.5 F  *CHILLS WITH OR WITHOUT FEVER  NAUSEA AND VOMITING THAT IS NOT CONTROLLED WITH YOUR NAUSEA MEDICATION  *UNUSUAL SHORTNESS OF BREATH  *UNUSUAL BRUISING OR BLEEDING  TENDERNESS IN MOUTH AND THROAT WITH OR WITHOUT PRESENCE OF ULCERS  *URINARY PROBLEMS  *BOWEL PROBLEMS  UNUSUAL RASH Items with * indicate a potential emergency and should be followed up as soon as possible.  One of the nurses will contact you 24 hours after your treatment. Please let the nurse know about any problems that you may have experienced. Feel free to call the clinic you have any questions or concerns. The clinic phone number is [phone].   I have been informed and understand all the instructions given to me. I know to contact the clinic, my physician, or go to the Emergency Department if any problems should occur. I do not have any questions at this time, but understand that I may call the clinic during office hours   should I have any questions or need assistance in obtaining follow up care.    __________________________________________  _____________  __________ Signature of Patient or Authorized Representative            Date                   Time    __________________________________________ Nurse's Signature

## 2011-07-09 NOTE — Progress Notes (Signed)
Spoke with pt and sister at [facility] today.  No concerns at this time.

## 2011-07-22 ENCOUNTER — Other Ambulatory Visit: Payer: Self-pay | Admitting: *Deleted

## 2011-07-22 DIAGNOSIS — C3490 Malignant neoplasm of unspecified part of unspecified bronchus or lung: Secondary | ICD-10-CM

## 2011-07-22 MED ORDER — ALPRAZOLAM 0.25 MG PO TABS: 0.2500 mg | ORAL_TABLET | Freq: Every evening | ORAL | Status: DC | PRN

## 2011-07-22 MED ORDER — MORPHINE SULFATE ER 60 MG PO TBCR: 60.0000 mg | EXTENDED_RELEASE_TABLET | Freq: Two times a day (BID) | ORAL | Status: DC

## 2011-07-22 MED ORDER — MORPHINE SULFATE 30 MG PO TABS: 30.0000 mg | ORAL_TABLET | ORAL | Status: DC | PRN

## 2011-07-29 ENCOUNTER — Other Ambulatory Visit: Payer: Self-pay | Admitting: *Deleted

## 2011-07-29 MED ORDER — SIMVASTATIN 40 MG PO TABS: 40.0000 mg | ORAL_TABLET | Freq: Every day | ORAL | Status: DC

## 2011-07-29 NOTE — Telephone Encounter (Signed)
Received faxed refill request from pharmacy. Refill sent to pharmacy electronically. 

## 2011-07-30 ENCOUNTER — Encounter: Payer: Self-pay | Admitting: Physician Assistant

## 2011-07-30 ENCOUNTER — Ambulatory Visit (HOSPITAL_BASED_OUTPATIENT_CLINIC_OR_DEPARTMENT_OTHER): Payer: Medicare Other | Admitting: Physician Assistant

## 2011-07-30 ENCOUNTER — Telehealth: Payer: Self-pay | Admitting: *Deleted

## 2011-07-30 ENCOUNTER — Other Ambulatory Visit: Payer: Self-pay | Admitting: Internal Medicine

## 2011-07-30 ENCOUNTER — Other Ambulatory Visit (HOSPITAL_BASED_OUTPATIENT_CLINIC_OR_DEPARTMENT_OTHER): Payer: Medicare Other | Admitting: Lab

## 2011-07-30 ENCOUNTER — Other Ambulatory Visit: Payer: Medicare Other | Admitting: Lab

## 2011-07-30 ENCOUNTER — Ambulatory Visit (HOSPITAL_BASED_OUTPATIENT_CLINIC_OR_DEPARTMENT_OTHER): Payer: Medicare Other

## 2011-07-30 VITALS — BP 107/67 | HR 88 | Temp 97.2°F | Ht 63.75 in | Wt 154.3 lb

## 2011-07-30 DIAGNOSIS — B370 Candidal stomatitis: Secondary | ICD-10-CM

## 2011-07-30 DIAGNOSIS — C786 Secondary malignant neoplasm of retroperitoneum and peritoneum: Secondary | ICD-10-CM

## 2011-07-30 DIAGNOSIS — C3490 Malignant neoplasm of unspecified part of unspecified bronchus or lung: Secondary | ICD-10-CM

## 2011-07-30 DIAGNOSIS — C7931 Secondary malignant neoplasm of brain: Secondary | ICD-10-CM

## 2011-07-30 DIAGNOSIS — C3410 Malignant neoplasm of upper lobe, unspecified bronchus or lung: Secondary | ICD-10-CM

## 2011-07-30 DIAGNOSIS — Z5111 Encounter for antineoplastic chemotherapy: Secondary | ICD-10-CM

## 2011-07-30 DIAGNOSIS — C7949 Secondary malignant neoplasm of other parts of nervous system: Secondary | ICD-10-CM

## 2011-07-30 LAB — COMPREHENSIVE METABOLIC PANEL
ALT: 9 U/L (ref 0–35)
Albumin: 3.9 g/dL (ref 3.5–5.2)
CO2: 29 mEq/L (ref 19–32)
Chloride: 108 mEq/L (ref 96–112)
Glucose, Bld: 107 mg/dL — ABNORMAL HIGH (ref 70–99)
Potassium: 4.2 mEq/L (ref 3.5–5.3)
Sodium: 142 mEq/L (ref 135–145)
Total Protein: 6.6 g/dL (ref 6.0–8.3)

## 2011-07-30 LAB — CBC WITH DIFFERENTIAL/PLATELET
BASO%: 0.6 % (ref 0.0–2.0)
Eosinophils Absolute: 0.1 10*3/uL (ref 0.0–0.5)
LYMPH%: 31.3 % (ref 14.0–49.7)
MCHC: 33 g/dL (ref 31.5–36.0)
MONO#: 0.7 10*3/uL (ref 0.1–0.9)
NEUT#: 3.5 10*3/uL (ref 1.5–6.5)
Platelets: 364 10*3/uL (ref 145–400)
RBC: 4.11 10*6/uL (ref 3.70–5.45)
RDW: 15 % — ABNORMAL HIGH (ref 11.2–14.5)
WBC: 6.3 10*3/uL (ref 3.9–10.3)
lymph#: 2 10*3/uL (ref 0.9–3.3)

## 2011-07-30 MED ORDER — HEPARIN SOD (PORK) LOCK FLUSH 100 UNIT/ML IV SOLN
500.0000 [IU] | Freq: Once | INTRAVENOUS | Status: AC | PRN
  Administered 2011-07-30: 500 [IU]
  Filled 2011-07-30: qty 5

## 2011-07-30 MED ORDER — FLUCONAZOLE 100 MG PO TABS: ORAL_TABLET | ORAL | Status: DC

## 2011-07-30 MED ORDER — SODIUM CHLORIDE 0.9 % IV SOLN: Freq: Once | INTRAVENOUS | Status: DC

## 2011-07-30 MED ORDER — ONDANSETRON 8 MG/50ML IVPB (CHCC)
8.0000 mg | Freq: Once | INTRAVENOUS | Status: AC
  Administered 2011-07-30: 8 mg via INTRAVENOUS

## 2011-07-30 MED ORDER — DEXAMETHASONE SODIUM PHOSPHATE 10 MG/ML IJ SOLN
10.0000 mg | Freq: Once | INTRAMUSCULAR | Status: AC
  Administered 2011-07-30: 10 mg via INTRAVENOUS

## 2011-07-30 MED ORDER — SODIUM CHLORIDE 0.9 % IV SOLN
500.0000 mg/m2 | Freq: Once | INTRAVENOUS | Status: AC
  Administered 2011-07-30: 900 mg via INTRAVENOUS
  Filled 2011-07-30: qty 36

## 2011-07-30 MED ORDER — SODIUM CHLORIDE 0.9 % IJ SOLN
10.0000 mL | INTRAMUSCULAR | Status: DC | PRN
  Administered 2011-07-30: 10 mL
  Filled 2011-07-30: qty 10

## 2011-07-30 NOTE — Telephone Encounter (Signed)
Per staff message and POF I have scheduled appt.  JMW  

## 2011-07-30 NOTE — Telephone Encounter (Signed)
Pt will come back to get new appt calendar today afterr [facility] for chemo appts

## 2011-07-30 NOTE — Progress Notes (Signed)
[facility], [facility] [phone]   [phone]  OFFICE PROGRESS NOTE  [name], MD [address] [place] [address]  DIAGNOSIS: Metastatic non-small cell lung cancer initially diagnosed with locally advanced stage IIIB with right Pancoast tumor involving the vertebral body as well as the foraminal canal invasion with spinal cord compression in October of 2007.   PRIOR THERAPY:  1. Status post concurrent chemoradiation with weekly carboplatin and paclitaxel, last dose was given November 18, 2005. 2. 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 3. Status post gamma knife radiotherapy to a solitary brain lesion located in the superior frontal area of the brain at [facility] in April of 2008. 4. Status post palliative radiotherapy to the lateral abdominal wall metastatic lesion under the care of Dr. Mitzi Hansen, completed March of 2009. 5. Status post 6 cycles of systemic chemotherapy with carboplatin and Alimta. Last dose was given July 26, 2007 with disease stabilization.  CURRENT THERAPY: : Maintenance chemotherapy with Alimta 500 mg per meter squared given every 3 weeks. The patient is status post 67 cycles.   INTERVAL HISTORY: Danielle [name] 61 y.o. female returns to the clinic today for followup visit.she states that she attended the funeral uncle last week out of town and clavicle. Her cold symptoms have not been associated with any fever chills or discolored or secretions. She continues to tolerate her maintenance chemotherapy without difficulty. She does not require any prescription refills today. She voiced no other specific complaints today.  She denied having any significant chest pain or shortness breath, no cough or hemoptysis. No significant weight loss or night sweats.   MEDICAL HISTORY: Past Medical History  Diagnosis Date  .  Hypertension   . Anemia   . H/O: pneumonia   . History of tobacco abuse quit 10/08    on nicotine patch  . Fibromyalgia   . Hypokalemia   . DJD (degenerative joint disease), cervical   . Thrush 12/11/2010  . 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 had chemo and radiation for lung ca    ALLERGIES:  is allergic to contrast media; co-trimoxazole injection; iohexol; and sulfa drugs cross reactors.  MEDICATIONS:  Current Outpatient Prescriptions  Medication Sig Dispense Refill  . ALPRAZolam (XANAX) 0.25 MG tablet Take 1 tablet (0.25 mg total) by mouth at bedtime as needed.  30 tablet  0  . amLODipine (NORVASC) 10 MG tablet TAKE 1 TABLET EVERY DAY  90 tablet  1  . antipyrine-benzocaine (AURALGAN) otic solution Place 3 drops into both ears every 2 (two) hours as needed.        . fluconazole (DIFLUCAN) 100 MG tablet Take 2 tablets by mouth on day one, then take 1 tablet by mouth daily until completed  16 tablet  0  . folic acid (FOLVITE) 1 MG tablet TAKE 1 TABLET BY MOUTH EVERY DAY  30 tablet  2  . lidocaine-prilocaine (EMLA) cream Apply topically as needed.  30 g  0  . lisinopril (PRINIVIL,ZESTRIL) 5 MG tablet Take 1 tablet (5 mg total) by mouth daily.  90 tablet  0  . morphine (MS CONTIN) 60 MG 12 hr tablet Take 1 tablet (60 mg total) by mouth 2 (two) times daily.  60 tablet  0  . morphine (MSIR) 30 MG tablet Take 1 tablet (30 mg total) by mouth every  4 (four) hours as needed. Take 1 tab every 4-6 hours as needed for breakthrough pain  60 tablet  0  . ondansetron (ZOFRAN) 8 MG tablet TAKE 1 TABLET (8 MG TOTAL) BY MOUTH EVERY 8 (EIGHT) HOURS AS NEEDED FOR NAUSEA.  30 tablet  1  . PATANOL 0.1 % ophthalmic solution PUT 1 DROP IN AFFECTED EYE(S) 2X A DAY  5 mL  1  . prochlorperazine (COMPAZINE) 10 MG tablet Take 10 mg by mouth every 6 (six) hours as needed.        . simvastatin (ZOCOR) 40 MG tablet Take 1 tablet (40 mg total) by mouth daily.  30 tablet  5   No  current facility-administered medications for this visit.   Facility-Administered Medications Ordered in Other Visits  Medication Dose Route Frequency Provider Last Rate Last Dose  . 0.9 %  sodium chloride infusion   Intravenous Once Danielle Gaul, MD      . dexamethasone (DECADRON) injection 10 mg  10 mg Intravenous Once Danielle Gaul, MD   10 mg at 07/30/11 1023  . heparin lock flush 100 unit/mL  500 Units Intracatheter Once PRN Danielle Gaul, MD   500 Units at 07/30/11 1052  . ondansetron (ZOFRAN) IVPB 8 mg  8 mg Intravenous Once Danielle Gaul, MD   8 mg at 07/30/11 1022  . PEMEtrexed (ALIMTA) 900 mg in sodium chloride 0.9 % 100 mL chemo infusion  500 mg/m2 (Treatment Plan Actual) Intravenous Once Danielle Gaul, MD   900 mg at 07/30/11 1035  . sodium chloride 0.9 % injection 10 mL  10 mL Intracatheter PRN Danielle Gaul, MD   10 mL at 07/30/11 1052    SURGICAL HISTORY:  Past Surgical History  Procedure Date  . Cervical laminectomy 1995  . Other surgical history 2008    Gamma knife surgery to remove brain met   . 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 Knee surgery 1990    Left x 2  . Portacath placement -[name]    TIP IN CAVOATRIAL JUNCTION    REVIEW OF SYSTEMS:  Pertinent items are noted in HPI.   PHYSICAL EXAMINATION: General appearance: alert, cooperative and no distress Neck: no adenopathy Resp: clear to auscultation bilaterally and Except for mild coarse rhonchi at the right base Cardio: regular rate and rhythm, S1, S2 normal, no murmur, click, rub or gallop GI: soft, non-tender; bowel sounds normal; no masses,  no organomegaly Extremities: extremities normal, atraumatic, no cyanosis or edema Neurologic: Alert and oriented X 3, normal strength and tone. Normal symmetric reflexes. Normal coordination and gait Mouth: Reveals mild thrush  ECOG PERFORMANCE STATUS: 0 - Asymptomatic  Blood pressure 107/67, pulse 88, temperature 97.2 F  (36.2 C), temperature source Oral, height 5' 3.75" (1.619 m), weight 154 lb 4.8 oz (69.99 kg).  LABORATORY DATA: Lab Results  Component Value Date   WBC 6.3 07/30/2011   HGB 12.1 07/30/2011   HCT 36.6 07/30/2011   MCV 89.0 07/30/2011   PLT 364 07/30/2011      Chemistry      Component Value Date/Time   NA 140 07/09/2011 0851   K 4.7 07/09/2011 0851   CL 106 07/09/2011 0851   CO2 26 07/09/2011 0851   BUN 15 07/09/2011 0851   CREATININE 0.65 07/09/2011 0851      Component Value Date/Time   CALCIUM 9.7 07/09/2011 0851   ALKPHOS 71 07/09/2011 0851   AST 16 07/09/2011 0851  ALT 12 07/09/2011 0851   BILITOT 0.3 07/09/2011 0851       RADIOGRAPHIC STUDIES: Ct Chest W Contrast  07/05/2011  *RADIOLOGY REPORT*  Clinical Data:  Metastatic lung cancer to spine, brain and omentum. Radiation therapy to left groin completed in 2009.  Ongoing chemotherapy.  Gamma knife completed 2008.  CT CHEST, ABDOMEN AND PELVIS WITH CONTRAST  Technique: Contiguous axial images of the chest abdomen and pelvis were obtained after IV contrast administration.  Contrast: 100  ml Omnipaque-300  Comparison: 05/03/2010  CT CHEST  Findings: Lung windows demonstrate that the previously described endobronchial finding left lower lobe is resolved and was likely due to fluid/secretions.  Moderate centrilobular emphysema.  Treatment effects within the right upper lobe with areas of interstitial thickening and similar mild nodularity.  Example anteriorly on image 17.  Pleural-based opacity at the posterior right apex measures 1.4 x 1.8 cm on image 9 and is similar.  Soft tissue windows demonstrate no supraclavicular adenopathy.  A right-sided Port-A-Cath which terminates at the right atrium.  Normal heart size without pericardial or pleural effusion. Multivessel coronary artery atherosclerosis.  No central pulmonary embolism, on this non-dedicated study.  No mediastinal or hilar adenopathy.  IMPRESSION:  1. No acute process or evidence of metastatic  disease in the chest. 2.  Similar post-treatment effects with scarring in the right upper lobe and apex. 3. Age advanced coronary artery atherosclerosis.  Recommend assessment of coronary risk factors and consideration of medical therapy.  CT ABDOMEN AND PELVIS  Findings:  Normal liver, spleen, stomach, pancreas, gallbladder, biliary tract, adrenal glands.  Lower pole left renal cyst.  Right proximal ureteric edema is again subtle and similar on image 72.  No retroperitoneal or retrocrural adenopathy.  Portions of the descending colon appear thick-walled.  Example image 81.  Felt to be due to underdistension.  No surrounding edema.  Normal terminal ileum and appendix.  Normal small bowel without abdominal ascites.    No evidence of omental or peritoneal disease.  No pelvic adenopathy.  Uterus is retroflexed.  Normal urinary bladder. No adnexal mass or significant free fluid.  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  Scattered tiny pelvic sclerotic lesions which are likely bone islands and are not significantly changed.  Prior vertebral augmentation at T4 and T7.  T3 vertebral plana.  IMPRESSION:  1. No acute process or evidence of metastatic disease in the abdomen or pelvis. 2.  Similar subtle right proximal ureteric inflammation.  Chronic. 3.  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  Original Report Authenticated By: Consuello Bossier, M.D.     ASSESSMENT/PLAN: This is a very pleasant 61 years old white female with metastatic non-small cell lung cancer currently on maintenance and Alimta for 66 cycles. The patient is doing fine and she has no evidence for disease progression on the recent scan. The patient was discussed with Dr. Arbutus Ped. She'll proceed with cycle #68 of her maintenance chemotherapy with single agent Alimta at 500 mg per meter squared given every 3 weeks. A prescription for Diflucan was sent via E. scribed to her pharmacy of record to  address the oral candidiasis. For her upper respiratory symptomatology she was advised to take Mucinex with plenty of water and to notify us should she develop fever or discolored secretions associated with her cold symptomatology.  Laural Benes, Colston Pyle E, PA-C   All questions were answered. The patient knows to call the clinic with any problems, questions or concerns.  We can certainly see the patient much sooner if necessary.  I spent 15 minutes counseling the patient face to face. The total time spent in the appointment was 25 minutes.

## 2011-08-02 ENCOUNTER — Encounter: Payer: Self-pay | Admitting: Family Medicine

## 2011-08-02 ENCOUNTER — Ambulatory Visit (INDEPENDENT_AMBULATORY_CARE_PROVIDER_SITE_OTHER): Payer: Medicare Other | Admitting: Family Medicine

## 2011-08-02 VITALS — BP 100/60 | HR 84 | Temp 98.1°F | Ht 63.75 in | Wt 152.5 lb

## 2011-08-02 DIAGNOSIS — E119 Type 2 diabetes mellitus without complications: Secondary | ICD-10-CM | POA: Diagnosis not present

## 2011-08-02 DIAGNOSIS — E7800 Pure hypercholesterolemia, unspecified: Secondary | ICD-10-CM | POA: Diagnosis not present

## 2011-08-02 DIAGNOSIS — I10 Essential (primary) hypertension: Secondary | ICD-10-CM

## 2011-08-02 LAB — HEMOGLOBIN A1C: Hgb A1c MFr Bld: 5.9 % (ref 4.6–6.5)

## 2011-08-02 MED ORDER — ANTIPYRINE-BENZOCAINE 5.4-1.4 % OT SOLN: 3.0000 [drp] | OTIC | Status: DC | PRN

## 2011-08-02 NOTE — Progress Notes (Signed)
Subjective:    Patient ID: Danielle [name], female    DOB: [birth year], 61 y.o.   MRN: [id number]  HPI  61 year old female here for 6 month follow up with:  Lung cancer, metastatic On 68 chemo cycle!!!.. MRI brain..with changes but was thought not to represent tumor recurrence at this time. Sees ONC regularly. Chemo recieves every 3 weeks.  CT scan of lungs and abdomen/pelvis stable at last check.  Pain well controlled with morphine..some constipation..hard stool.  Sleeping well. Using alprazolam.   Diabetes: Well controlled with diet .Marland Kitchen Due for re-eval. Lab Results  Component Value Date   HGBA1C 6.3 01/10/2011  Using medications without difficulties:  Hypoglycemic episodes: None  Hyperglycemic episodes:None  Feet problems: None  Blood Sugars averaging: not checking at home.  eye exam within last year: No   Hypertension: Well controlled on amlodipine and lisinopril  Using medication without problems or lightheadedness:  Chest pain with exertion:None  Edema:Occ  Short of breath: Yes, but stable  Average home BPs: At goal.  Other issues:   Elevated Cholesterol: Well controlled on zocor at last check. Lab Results  Component Value Date   CHOL 141 01/10/2011   HDL 35.30* 01/10/2011   LDLCALC 79 01/10/2011   LDLDIRECT 165.4 09/20/2008   TRIG 136.0 01/10/2011   CHOLHDL 4 01/10/2011    Using medications without problems:Yes  Muscle aches: None  Other complaints:        Review of Systems  Constitutional: Negative for fever and fatigue.  HENT: Negative for ear pain.   Eyes: Negative for pain.  Respiratory: Negative for chest tightness and shortness of breath.   Cardiovascular: Negative for chest pain, palpitations and leg swelling.  Gastrointestinal: Negative for abdominal pain.  Genitourinary: Negative for dysuria.       Objective:   Physical Exam  Constitutional: Vital signs are normal. She appears well-developed and well-nourished. She is cooperative.  Non-toxic  appearance. She does not appear ill. No distress.  HENT:  Head: Normocephalic.  Right Ear: Hearing, tympanic membrane, external ear and ear canal normal. Tympanic membrane is not erythematous, not retracted and not bulging.  Left Ear: Hearing, tympanic membrane, external ear and ear canal normal. Tympanic membrane is not erythematous, not retracted and not bulging.  Nose: No mucosal edema or rhinorrhea. Right sinus exhibits no maxillary sinus tenderness and no frontal sinus tenderness. Left sinus exhibits no maxillary sinus tenderness and no frontal sinus tenderness.  Mouth/Throat: Uvula is midline, oropharynx is clear and moist and mucous membranes are normal.  Eyes: Conjunctivae, EOM and lids are normal. Pupils are equal, round, and reactive to light. No foreign bodies found.  Neck: Trachea normal and normal range of motion. Neck supple. Carotid bruit is not present. No mass and no thyromegaly present.  Cardiovascular: Normal rate, regular rhythm, S1 normal, S2 normal, normal heart sounds, intact distal pulses and normal pulses.  Exam reveals no gallop and no friction rub.   No murmur heard. Pulmonary/Chest: Effort normal and breath sounds normal. Not tachypneic. No respiratory distress. She has no decreased breath sounds. She has no wheezes. She has no rhonchi. She has no rales.       Lung exam stable from previous Moist cough intermittantly  Abdominal: Soft. Normal appearance and bowel sounds are normal. There is no tenderness.  Neurological: She is alert.  Skin: Skin is warm, dry and intact. No rash noted.  Psychiatric: Her speech is normal and behavior is normal. Judgment and thought content normal. Her mood  appears not anxious. Cognition and memory are normal. She does not exhibit a depressed mood.    Diabetic foot exam: Normal inspection No skin breakdown No calluses  Normal DP pulses Normal sensation to light touch and monofilament Nails normal       Assessment & Plan:

## 2011-08-02 NOTE — Assessment & Plan Note (Signed)
Well controlled last check.. Will eval yearly.

## 2011-08-02 NOTE — Assessment & Plan Note (Signed)
Well controlled. Continue current medication.  

## 2011-08-02 NOTE — Assessment & Plan Note (Signed)
Due for re-eval. 

## 2011-08-02 NOTE — Patient Instructions (Addendum)
Keep up the great work!!    We will call with lab results.

## 2011-08-19 ENCOUNTER — Other Ambulatory Visit: Payer: Self-pay | Admitting: Family Medicine

## 2011-08-20 ENCOUNTER — Encounter: Payer: Self-pay | Admitting: Physician Assistant

## 2011-08-20 ENCOUNTER — Ambulatory Visit (HOSPITAL_BASED_OUTPATIENT_CLINIC_OR_DEPARTMENT_OTHER): Payer: Medicare Other | Admitting: Physician Assistant

## 2011-08-20 ENCOUNTER — Other Ambulatory Visit (HOSPITAL_BASED_OUTPATIENT_CLINIC_OR_DEPARTMENT_OTHER): Payer: Medicare Other | Admitting: Lab

## 2011-08-20 ENCOUNTER — Ambulatory Visit (HOSPITAL_BASED_OUTPATIENT_CLINIC_OR_DEPARTMENT_OTHER): Payer: Medicare Other

## 2011-08-20 ENCOUNTER — Telehealth: Payer: Self-pay | Admitting: Internal Medicine

## 2011-08-20 VITALS — BP 87/59 | HR 90 | Temp 97.7°F | Resp 20 | Ht 64.0 in | Wt 153.5 lb

## 2011-08-20 DIAGNOSIS — C3490 Malignant neoplasm of unspecified part of unspecified bronchus or lung: Secondary | ICD-10-CM

## 2011-08-20 DIAGNOSIS — C3410 Malignant neoplasm of upper lobe, unspecified bronchus or lung: Secondary | ICD-10-CM

## 2011-08-20 DIAGNOSIS — Z5111 Encounter for antineoplastic chemotherapy: Secondary | ICD-10-CM

## 2011-08-20 DIAGNOSIS — C7952 Secondary malignant neoplasm of bone marrow: Secondary | ICD-10-CM

## 2011-08-20 DIAGNOSIS — C7949 Secondary malignant neoplasm of other parts of nervous system: Secondary | ICD-10-CM

## 2011-08-20 DIAGNOSIS — B370 Candidal stomatitis: Secondary | ICD-10-CM

## 2011-08-20 DIAGNOSIS — C7951 Secondary malignant neoplasm of bone: Secondary | ICD-10-CM | POA: Diagnosis not present

## 2011-08-20 DIAGNOSIS — C7931 Secondary malignant neoplasm of brain: Secondary | ICD-10-CM | POA: Diagnosis not present

## 2011-08-20 LAB — COMPREHENSIVE METABOLIC PANEL
ALT: 9 U/L (ref 0–35)
AST: 16 U/L (ref 0–37)
CO2: 28 mEq/L (ref 19–32)
Chloride: 105 mEq/L (ref 96–112)
Creatinine, Ser: 0.8 mg/dL (ref 0.50–1.10)
Sodium: 135 mEq/L (ref 135–145)
Total Bilirubin: 0.5 mg/dL (ref 0.3–1.2)
Total Protein: 6.6 g/dL (ref 6.0–8.3)

## 2011-08-20 LAB — CBC WITH DIFFERENTIAL/PLATELET
BASO%: 0.4 % (ref 0.0–2.0)
EOS%: 0.8 % (ref 0.0–7.0)
HCT: 35.8 % (ref 34.8–46.6)
LYMPH%: 20.8 % (ref 14.0–49.7)
MCH: 29 pg (ref 25.1–34.0)
MCHC: 32.7 g/dL (ref 31.5–36.0)
MONO#: 1.5 10*3/uL — ABNORMAL HIGH (ref 0.1–0.9)
NEUT%: 64.6 % (ref 38.4–76.8)
RBC: 4.03 10*6/uL (ref 3.70–5.45)
WBC: 11.1 10*3/uL — ABNORMAL HIGH (ref 3.9–10.3)
lymph#: 2.3 10*3/uL (ref 0.9–3.3)

## 2011-08-20 MED ORDER — MORPHINE SULFATE ER 60 MG PO TBCR: 60.0000 mg | EXTENDED_RELEASE_TABLET | Freq: Two times a day (BID) | ORAL | Status: DC

## 2011-08-20 MED ORDER — SODIUM CHLORIDE 0.9 % IJ SOLN
10.0000 mL | INTRAMUSCULAR | Status: DC | PRN
  Filled 2011-08-20: qty 10

## 2011-08-20 MED ORDER — PREDNISONE 50 MG PO TABS: ORAL_TABLET | ORAL | Status: AC

## 2011-08-20 MED ORDER — DEXAMETHASONE SODIUM PHOSPHATE 10 MG/ML IJ SOLN
10.0000 mg | Freq: Once | INTRAMUSCULAR | Status: AC
  Administered 2011-08-20: 10 mg via INTRAVENOUS

## 2011-08-20 MED ORDER — ALPRAZOLAM 0.25 MG PO TABS: 0.2500 mg | ORAL_TABLET | Freq: Every evening | ORAL | Status: DC | PRN

## 2011-08-20 MED ORDER — SODIUM CHLORIDE 0.9 % IV SOLN
500.0000 mg/m2 | Freq: Once | INTRAVENOUS | Status: AC
  Administered 2011-08-20: 900 mg via INTRAVENOUS
  Filled 2011-08-20: qty 36

## 2011-08-20 MED ORDER — SODIUM CHLORIDE 0.9 % IV SOLN
Freq: Once | INTRAVENOUS | Status: AC
  Administered 2011-08-20: 11:00:00 via INTRAVENOUS

## 2011-08-20 MED ORDER — FOLIC ACID 1 MG PO TABS: 1.0000 mg | ORAL_TABLET | Freq: Every day | ORAL | Status: DC

## 2011-08-20 MED ORDER — HEPARIN SOD (PORK) LOCK FLUSH 100 UNIT/ML IV SOLN
500.0000 [IU] | Freq: Once | INTRAVENOUS | Status: DC | PRN
  Filled 2011-08-20: qty 5

## 2011-08-20 MED ORDER — ONDANSETRON 8 MG/50ML IVPB (CHCC)
8.0000 mg | Freq: Once | INTRAVENOUS | Status: AC
  Administered 2011-08-20: 8 mg via INTRAVENOUS

## 2011-08-20 MED ORDER — MORPHINE SULFATE 30 MG PO TABS: 30.0000 mg | ORAL_TABLET | ORAL | Status: DC | PRN

## 2011-08-20 MED ORDER — CYANOCOBALAMIN 1000 MCG/ML IJ SOLN
1000.0000 ug | Freq: Once | INTRAMUSCULAR | Status: AC
  Administered 2011-08-20: 1000 ug via INTRAMUSCULAR

## 2011-08-20 NOTE — Progress Notes (Signed)
[facility] Telephone:(336) [phone]   [phone]  OFFICE PROGRESS NOTE  [name], MD [address] [place] [address]  DIAGNOSIS: Metastatic non-small cell lung cancer initially diagnosed with locally advanced stage IIIB with right Pancoast tumor involving the vertebral body as well as the foraminal canal invasion with spinal cord compression in October of 2007.   PRIOR THERAPY:  1. Status post concurrent chemoradiation with weekly carboplatin and paclitaxel, last dose was given November 18, 2005. 2. 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 3. Status post gamma knife radiotherapy to a solitary brain lesion located in the superior frontal area of the brain at [facility] in April of 2008. 4. Status post palliative radiotherapy to the lateral abdominal wall metastatic lesion under the care of Dr. Mitzi Hansen, completed March of 2009. 5. Status post 6 cycles of systemic chemotherapy with carboplatin and Alimta. Last dose was given July 26, 2007 with disease stabilization.  CURRENT THERAPY: : Maintenance chemotherapy with Alimta 500 mg per meter squared given every 3 weeks. The patient is status post 68 cycles.   INTERVAL HISTORY: Danielle [name] 61 y.o. female returns to the clinic today for followup visit. She reports some mild cold symptoms that are not associated with fever or chills. She continues to tolerate her maintenance chemotherapy without difficulty. She requests refills for her MS Contin, MSIR, Xanax, and folic acid. She will also need a refill of her prednisone that she takes prior to her CT scans.  She voiced no other specific complaints today.  She denied having any significant chest pain or shortness breath, no cough or hemoptysis. No significant weight loss or night sweats. She denies any dizziness.  MEDICAL HISTORY: Past Medical  History  Diagnosis Date  . Hypertension   . Anemia   . H/O: pneumonia   . History of tobacco abuse quit 10/08    on nicotine patch  . Fibromyalgia   . Hypokalemia   . DJD (degenerative joint disease), cervical   . Thrush 12/11/2010  . 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 had chemo and radiation for lung ca    ALLERGIES:  is allergic to contrast media; co-trimoxazole injection; iohexol; and sulfa drugs cross reactors.  MEDICATIONS:  Current Outpatient Prescriptions  Medication Sig Dispense Refill  . ALPRAZolam (XANAX) 0.25 MG tablet Take 1 tablet (0.25 mg total) by mouth at bedtime as needed.  30 tablet  0  . amLODipine (NORVASC) 10 MG tablet TAKE 1 TABLET EVERY DAY  90 tablet  1  . fluconazole (DIFLUCAN) 100 MG tablet Take 2 tablets by mouth on day one, then take 1 tablet by mouth daily until completed  16 tablet  0  . folic acid (FOLVITE) 1 MG tablet Take 1 tablet (1 mg total) by mouth daily.  90 tablet  3  . lidocaine-prilocaine (EMLA) cream Apply topically as needed.  30 g  0  . lisinopril (PRINIVIL,ZESTRIL) 5 MG tablet TAKE 1 TABLET BY MOUTH EVERY DAY  90 tablet  0  . morphine (MS CONTIN) 60 MG 12 hr tablet Take 1 tablet (60 mg total) by mouth 2 (two) times daily.  60 tablet  0  . morphine (MS CONTIN) 60 MG 12 hr tablet Take 1 tablet (60 mg total) by mouth 2 (two) times daily.  60 tablet  0  . morphine (  MSIR) 30 MG tablet Take 1 tablet (30 mg total) by mouth every 4 (four) hours as needed. Take 1 tab every 4-6 hours as needed for breakthrough pain  60 tablet  0  . ondansetron (ZOFRAN) 8 MG tablet TAKE 1 TABLET (8 MG TOTAL) BY MOUTH EVERY 8 (EIGHT) HOURS AS NEEDED FOR NAUSEA.  30 tablet  1  . PATANOL 0.1 % ophthalmic solution PUT 1 DROP IN AFFECTED EYE(S) 2X A DAY  5 mL  1  . predniSONE (DELTASONE) 50 MG tablet Take one tab with food, 13 hrs, 7 hrs and 1 hr before CT scan  90 tablet  3  . simvastatin (ZOCOR) 40 MG tablet Take 1 tablet (40 mg total)  by mouth daily.  30 tablet  5   No current facility-administered medications for this visit.   Facility-Administered Medications Ordered in Other Visits  Medication Dose Route Frequency Provider Last Rate Last Dose  . 0.9 %  sodium chloride infusion   Intravenous Once Danielle Slipper, PA      . cyanocobalamin ((VITAMIN B-12)) injection 1,000 mcg  1,000 mcg Intramuscular Once Danielle Slipper, PA      . dexamethasone (DECADRON) injection 10 mg  10 mg Intravenous Once [employer], PA      . heparin lock flush 100 unit/mL  500 Units Intracatheter Once PRN Danielle Slipper, PA      . ondansetron (ZOFRAN) IVPB 8 mg  8 mg Intravenous Once Danielle Musselman E Brylinn Teaney, PA      . sodium chloride 0.9 % injection 10 mL  10 mL Intracatheter PRN Danielle Slipper, PA        SURGICAL HISTORY:  Past Surgical History  Procedure Date  . Cervical laminectomy 1995  . Other surgical history 2008    Gamma knife surgery to remove brain met   . 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 Knee surgery 1990    Left x 2  . Danielle [name]    TIP IN CAVOATRIAL JUNCTION    REVIEW OF SYSTEMS:  Pertinent items are noted in HPI.   PHYSICAL EXAMINATION: General appearance: alert, cooperative and no distress Neck: no adenopathy Resp: clear to auscultation bilaterally and Except for mild coarse rhonchi at the right base Cardio: regular rate and rhythm, S1, S2 normal, no murmur, click, rub or gallop GI: soft, non-tender; bowel sounds normal; no masses,  no organomegaly Extremities: extremities normal, atraumatic, no cyanosis or edema Neurologic: Alert and oriented X 3, normal strength and tone. Normal symmetric reflexes. Normal coordination and gait Mouth: Clear  ECOG PERFORMANCE STATUS: 0 - Asymptomatic  Blood pressure 87/59, pulse 90, temperature 97.7 F (36.5 C), temperature source Oral, resp. rate 20, height 5\' 4"  (1.626 m), weight 153 lb 8 oz (69.627 kg).  LABORATORY  DATA: Lab Results  Component Value Date   WBC 11.1* 08/20/2011   HGB 11.7 08/20/2011   HCT 35.8 08/20/2011   MCV 88.7 08/20/2011   PLT 341 08/20/2011      Chemistry      Component Value Date/Time   NA 142 07/30/2011 0923   K 4.2 07/30/2011 0923   CL 108 07/30/2011 0923   CO2 29 07/30/2011 0923   BUN 9 07/30/2011 0923   CREATININE 0.71 07/30/2011 0923      Component Value Date/Time   CALCIUM 9.3 07/30/2011 0923   ALKPHOS 70 07/30/2011 0923   AST 16 07/30/2011 0923   ALT  9 07/30/2011 0923   BILITOT 0.3 07/30/2011 1610       RADIOGRAPHIC STUDIES: Ct Chest W Contrast  07/05/2011  *RADIOLOGY REPORT*  Clinical Data:  Metastatic lung cancer to spine, brain and omentum. Radiation therapy to left groin completed in 2009.  Ongoing chemotherapy.  Gamma knife completed 2008.  CT CHEST, ABDOMEN AND PELVIS WITH CONTRAST  Technique: Contiguous axial images of the chest abdomen and pelvis were obtained after IV contrast administration.  Contrast: 100  ml Omnipaque-300  Comparison: 05/03/2010  CT CHEST  Findings: Lung windows demonstrate that the previously described endobronchial finding left lower lobe is resolved and was likely due to fluid/secretions.  Moderate centrilobular emphysema.  Treatment effects within the right upper lobe with areas of interstitial thickening and similar mild nodularity.  Example anteriorly on image 17.  Pleural-based opacity at the posterior right apex measures 1.4 x 1.8 cm on image 9 and is similar.  Soft tissue windows demonstrate no supraclavicular adenopathy.  A right-sided Port-A-Cath which terminates at the right atrium.  Normal heart size without pericardial or pleural effusion. Multivessel coronary artery atherosclerosis.  No central pulmonary embolism, on this non-dedicated study.  No mediastinal or hilar adenopathy.  IMPRESSION:  1. No acute process or evidence of metastatic disease in the chest. 2.  Similar post-treatment effects with scarring in the right upper lobe and  apex. 3. Age advanced coronary artery atherosclerosis.  Recommend assessment of coronary risk factors and consideration of medical therapy.  CT ABDOMEN AND PELVIS  Findings:  Normal liver, spleen, stomach, pancreas, gallbladder, biliary tract, adrenal glands.  Lower pole left renal cyst.  Right proximal ureteric edema is again subtle and similar on image 72.  No retroperitoneal or retrocrural adenopathy.  Portions of the descending colon appear thick-walled.  Example image 81.  Felt to be due to underdistension.  No surrounding edema.  Normal terminal ileum and appendix.  Normal small bowel without abdominal ascites.    No evidence of omental or peritoneal disease.  No pelvic adenopathy.  Uterus is retroflexed.  Normal urinary bladder. No adnexal mass or significant free fluid.  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  Scattered tiny pelvic sclerotic lesions which are likely bone islands and are not significantly changed.  Prior vertebral augmentation at T4 and T7.  T3 vertebral plana.  IMPRESSION:  1. No acute process or evidence of metastatic disease in the abdomen or pelvis. 2.  Similar subtle right proximal ureteric inflammation.  Chronic. 3.  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  Original Report Authenticated By: Consuello Bossier, M.D.     ASSESSMENT/PLAN: This is a very pleasant 61 years old white female with metastatic non-small cell lung cancer currently on maintenance and Alimta for 68 cycles. The patient is doing fine and she has no evidence for disease progression on the last scan. The patient was discussed with Dr. Arbutus Ped. She'll proceed with cycle #69 of her maintenance chemotherapy with single agent Alimta at 500 mg per meter squared given every 3 weeks. A prescription for a 90 day supply for folic acid with 3 refills as well as refill of her prednisone 50 mg tablets taken 13 hours 7 hours and 1 hour before her CT scan was sent to her  pharmacy of record via E scribe. The patient is aware that she is to also take 50 mg of Benadryl one hour prior to her CT scan. She was given a refill prescriptions for her MS  Contin, MSIR, and Xanax. Each with a 30 day supply with no refill. She'll followup with Dr. Arbutus Ped in 3 weeks with repeat CBC differential, C. met, and CT of the chest abdomen and pelvis with contrast to reevaluate her disease.  Laural Benes, Srinivas Lippman E, PA-C   All questions were answered. The patient knows to call the clinic with any problems, questions or concerns. We can certainly see the patient much sooner if necessary.  I spent 20 minutes counseling the patient face to face. The total time spent in the appointment was 30 minutes.

## 2011-08-20 NOTE — Patient Instructions (Addendum)
[facility] Discharge Instructions for Patients Receiving Chemotherapy  Today you received the following chemotherapy agents alimta  To help prevent nausea and vomiting after your treatment, we encourage you to take your nausea medication  and take it as often as prescribed    If you develop nausea and vomiting that is not controlled by your nausea medication, call the clinic. If it is after clinic hours your family physician or the after hours number for the clinic or go to the Emergency Department.   BELOW ARE SYMPTOMS THAT SHOULD BE REPORTED IMMEDIATELY:  *FEVER GREATER THAN 100.5 F  *CHILLS WITH OR WITHOUT FEVER  NAUSEA AND VOMITING THAT IS NOT CONTROLLED WITH YOUR NAUSEA MEDICATION  *UNUSUAL SHORTNESS OF BREATH  *UNUSUAL BRUISING OR BLEEDING  TENDERNESS IN MOUTH AND THROAT WITH OR WITHOUT PRESENCE OF ULCERS  *URINARY PROBLEMS  *BOWEL PROBLEMS  UNUSUAL RASH Items with * indicate a potential emergency and should be followed up as soon as possible.  One of the nurses will contact you 24 hours after your treatment. Please let the nurse know about any problems that you may have experienced. Feel free to call the clinic you have any questions or concerns. The clinic phone number is [phone].   I have been informed and understand all the instructions given to me. I know to contact the clinic, my physician, or go to the Emergency Department if any problems should occur. I do not have any questions at this time, but understand that I may call the clinic during office hours   should I have any questions or need assistance in obtaining follow up care.    __________________________________________  _____________  __________ Signature of Patient or Authorized Representative            Date                   Time    __________________________________________ Nurse's Signature    

## 2011-08-20 NOTE — Telephone Encounter (Signed)
appts made and printed for pt aom °

## 2011-09-02 ENCOUNTER — Other Ambulatory Visit: Payer: Self-pay | Admitting: *Deleted

## 2011-09-02 MED ORDER — AMLODIPINE BESYLATE 10 MG PO TABS: 10.0000 mg | ORAL_TABLET | Freq: Every day | ORAL | Status: DC

## 2011-09-06 ENCOUNTER — Encounter (HOSPITAL_COMMUNITY): Payer: Self-pay

## 2011-09-06 ENCOUNTER — Ambulatory Visit (HOSPITAL_COMMUNITY)
Admission: RE | Admit: 2011-09-06 | Discharge: 2011-09-06 | Disposition: A | Discharge status: Home / Self Care | Payer: Medicare Other | Source: Ambulatory Visit | Attending: Physician Assistant | Admitting: Physician Assistant

## 2011-09-06 DIAGNOSIS — C7931 Secondary malignant neoplasm of brain: Secondary | ICD-10-CM | POA: Diagnosis not present

## 2011-09-06 DIAGNOSIS — C7952 Secondary malignant neoplasm of bone marrow: Secondary | ICD-10-CM | POA: Insufficient documentation

## 2011-09-06 DIAGNOSIS — C3490 Malignant neoplasm of unspecified part of unspecified bronchus or lung: Secondary | ICD-10-CM | POA: Diagnosis not present

## 2011-09-06 DIAGNOSIS — C7951 Secondary malignant neoplasm of bone: Secondary | ICD-10-CM | POA: Diagnosis not present

## 2011-09-06 DIAGNOSIS — C7949 Secondary malignant neoplasm of other parts of nervous system: Secondary | ICD-10-CM | POA: Insufficient documentation

## 2011-09-06 DIAGNOSIS — C786 Secondary malignant neoplasm of retroperitoneum and peritoneum: Secondary | ICD-10-CM | POA: Insufficient documentation

## 2011-09-06 MED ORDER — IOHEXOL 300 MG/ML  SOLN
100.0000 mL | Freq: Once | INTRAMUSCULAR | Status: AC | PRN
  Administered 2011-09-06: 100 mL via INTRAVENOUS

## 2011-09-10 ENCOUNTER — Ambulatory Visit (HOSPITAL_BASED_OUTPATIENT_CLINIC_OR_DEPARTMENT_OTHER): Payer: Medicare Other | Admitting: Internal Medicine

## 2011-09-10 ENCOUNTER — Telehealth: Payer: Self-pay | Admitting: Internal Medicine

## 2011-09-10 ENCOUNTER — Other Ambulatory Visit (HOSPITAL_BASED_OUTPATIENT_CLINIC_OR_DEPARTMENT_OTHER): Payer: Medicare Other | Admitting: Lab

## 2011-09-10 ENCOUNTER — Ambulatory Visit (HOSPITAL_BASED_OUTPATIENT_CLINIC_OR_DEPARTMENT_OTHER): Payer: Medicare Other

## 2011-09-10 ENCOUNTER — Telehealth: Payer: Self-pay | Admitting: *Deleted

## 2011-09-10 VITALS — BP 123/77 | HR 90 | Temp 97.5°F | Resp 20 | Ht 64.0 in | Wt 151.1 lb

## 2011-09-10 DIAGNOSIS — C7951 Secondary malignant neoplasm of bone: Secondary | ICD-10-CM

## 2011-09-10 DIAGNOSIS — Z5111 Encounter for antineoplastic chemotherapy: Secondary | ICD-10-CM | POA: Diagnosis not present

## 2011-09-10 DIAGNOSIS — C7952 Secondary malignant neoplasm of bone marrow: Secondary | ICD-10-CM

## 2011-09-10 DIAGNOSIS — R112 Nausea with vomiting, unspecified: Secondary | ICD-10-CM

## 2011-09-10 DIAGNOSIS — C7931 Secondary malignant neoplasm of brain: Secondary | ICD-10-CM | POA: Diagnosis not present

## 2011-09-10 DIAGNOSIS — C3410 Malignant neoplasm of upper lobe, unspecified bronchus or lung: Secondary | ICD-10-CM

## 2011-09-10 DIAGNOSIS — C3490 Malignant neoplasm of unspecified part of unspecified bronchus or lung: Secondary | ICD-10-CM

## 2011-09-10 DIAGNOSIS — C7949 Secondary malignant neoplasm of other parts of nervous system: Secondary | ICD-10-CM | POA: Diagnosis not present

## 2011-09-10 LAB — CBC WITH DIFFERENTIAL/PLATELET
Basophils Absolute: 0 10*3/uL (ref 0.0–0.1)
Eosinophils Absolute: 0.1 10*3/uL (ref 0.0–0.5)
HCT: 39.7 % (ref 34.8–46.6)
HGB: 13.1 g/dL (ref 11.6–15.9)
MCV: 88.4 fL (ref 79.5–101.0)
MONO%: 9.9 % (ref 0.0–14.0)
NEUT#: 6.4 10*3/uL (ref 1.5–6.5)
RDW: 16.2 % — ABNORMAL HIGH (ref 11.2–14.5)
lymph#: 2.2 10*3/uL (ref 0.9–3.3)

## 2011-09-10 LAB — COMPREHENSIVE METABOLIC PANEL (CC13)
Albumin: 3.8 g/dL (ref 3.5–5.0)
BUN: 12 mg/dL (ref 7.0–26.0)
Calcium: 9.8 mg/dL (ref 8.4–10.4)
Chloride: 106 mEq/L (ref 98–107)
Glucose: 112 mg/dl — ABNORMAL HIGH (ref 70–99)
Potassium: 4 mEq/L (ref 3.5–5.1)

## 2011-09-10 MED ORDER — ONDANSETRON 8 MG/50ML IVPB (CHCC)
8.0000 mg | Freq: Once | INTRAVENOUS | Status: AC
  Administered 2011-09-10: 8 mg via INTRAVENOUS

## 2011-09-10 MED ORDER — SODIUM CHLORIDE 0.9 % IV SOLN
Freq: Once | INTRAVENOUS | Status: AC
  Administered 2011-09-10: 10:00:00 via INTRAVENOUS

## 2011-09-10 MED ORDER — ONDANSETRON HCL 8 MG PO TABS: 8.0000 mg | ORAL_TABLET | Freq: Three times a day (TID) | ORAL | Status: DC | PRN

## 2011-09-10 MED ORDER — SODIUM CHLORIDE 0.9 % IV SOLN
500.0000 mg/m2 | Freq: Once | INTRAVENOUS | Status: AC
  Administered 2011-09-10: 900 mg via INTRAVENOUS
  Filled 2011-09-10: qty 36

## 2011-09-10 MED ORDER — DEXAMETHASONE SODIUM PHOSPHATE 10 MG/ML IJ SOLN
10.0000 mg | Freq: Once | INTRAMUSCULAR | Status: AC
  Administered 2011-09-10: 10 mg via INTRAVENOUS

## 2011-09-10 NOTE — Telephone Encounter (Signed)
Gave pt appt for September 2013 lab, ML, emailed [facility] regarding chemo for September and October 2013

## 2011-09-10 NOTE — Patient Instructions (Signed)
Your scan is good today. Continue chemotherapy the same regimen. Followup in 3 weeks

## 2011-09-10 NOTE — Telephone Encounter (Signed)
Per staff message and POF I have scheduled appts.  JMW  

## 2011-09-10 NOTE — Progress Notes (Signed)
[facility] Telephone:(336) [phone]   Fax:(336) [phone]  OFFICE PROGRESS NOTE  [name], MD [address] [place] [address]  DIAGNOSIS: Metastatic non-small cell lung cancer initially diagnosed with locally advanced stage IIIB with right Pancoast tumor involving the vertebral body as well as the foraminal canal invasion with spinal cord compression in October of 2007.   PRIOR THERAPY:  1. Status post concurrent chemoradiation with weekly carboplatin and paclitaxel, last dose was given November 18, 2005. 2. 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 3. Status post gamma knife radiotherapy to a solitary brain lesion located in the superior frontal area of the brain at [facility] in April of 2008. 4. Status post palliative radiotherapy to the lateral abdominal wall metastatic lesion under the care of Dr. Mitzi Hansen, completed March of 2009. 5. Status post 6 cycles of systemic chemotherapy with carboplatin and Alimta. Last dose was given July 26, 2007 with disease stabilization.  CURRENT THERAPY: : Maintenance chemotherapy with Alimta 500 mg per meter squared given every 3 weeks. The patient is status post 69 cycles.  INTERVAL HISTORY: Danielle [name] 61 y.o. female returns to the clinic today for followup visit accompanied by her sister. The patient is feeling fine today with no specific complaints. She tolerated the last cycle of her maintenance therapy with Alimta fairly well with no significant complaints. She denied having any significant fever or chills, no nausea or vomiting. She denied having any significant chest pain, shortness breath, cough or hemoptysis. She has no weight loss or night sweats. The patient has repeat CT scan of the chest, abdomen and pelvis performed recently and she is here today for evaluation and discussion of her scan  results.  MEDICAL HISTORY: Past Medical History  Diagnosis Date  . Hypertension   . Anemia   . H/O: pneumonia   . History of tobacco abuse quit 10/08    on nicotine patch  . Fibromyalgia   . Hypokalemia   . DJD (degenerative joint disease), cervical   . Thrush 12/11/2010  . 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 had chemo and radiation for lung ca    ALLERGIES:  is allergic to contrast media; co-trimoxazole injection; iohexol; and sulfa drugs cross reactors.  MEDICATIONS:  Current Outpatient Prescriptions  Medication Sig Dispense Refill  . ALPRAZolam (XANAX) 0.25 MG tablet Take 1 tablet (0.25 mg total) by mouth at bedtime as needed.  30 tablet  0  . amLODipine (NORVASC) 10 MG tablet Take 1 tablet (10 mg total) by mouth daily.  90 tablet  3  . fluconazole (DIFLUCAN) 100 MG tablet Take 2 tablets by mouth on day one, then take 1 tablet by mouth daily until completed  16 tablet  0  . folic acid (FOLVITE) 1 MG tablet Take 1 tablet (1 mg total) by mouth daily.  90 tablet  3  . lidocaine-prilocaine (EMLA) cream Apply topically as needed.  30 g  0  . lisinopril (PRINIVIL,ZESTRIL) 5 MG tablet TAKE 1 TABLET BY MOUTH EVERY DAY  90 tablet  0  . morphine (MS CONTIN) 60 MG 12 hr tablet Take 1 tablet (60 mg total) by mouth 2 (two) times daily.  60 tablet  0  . morphine (MS CONTIN) 60 MG 12 hr tablet Take 1 tablet (60 mg total) by mouth 2 (two) times daily.  60 tablet  0  . morphine (MSIR) 30 MG tablet Take 1 tablet (30 mg total) by mouth every 4 (four) hours as needed. Take 1 tab every 4-6 hours as needed for breakthrough pain  60 tablet  0  . ondansetron (ZOFRAN) 8 MG tablet Take 1 tablet (8 mg total) by mouth every 8 (eight) hours as needed for nausea.  30 tablet  1  . PATANOL 0.1 % ophthalmic solution PUT 1 DROP IN AFFECTED EYE(S) 2X A DAY  5 mL  1  . simvastatin (ZOCOR) 40 MG tablet Take 1 tablet (40 mg total) by mouth daily.  30 tablet  5    SURGICAL HISTORY:   Past Surgical History  Procedure Date  . Cervical laminectomy 1995  . Other surgical history 2008    Gamma knife surgery to remove brain met   . 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 Knee surgery 1990    Left x 2  . Portacath placement -[name]    TIP IN CAVOATRIAL JUNCTION    REVIEW OF SYSTEMS:  A comprehensive review of systems was negative.   PHYSICAL EXAMINATION: General appearance: alert, cooperative and no distress Head: Normocephalic, without obvious abnormality, atraumatic Neck: no adenopathy Lymph nodes: Cervical, supraclavicular, and axillary nodes normal. Resp: clear to auscultation bilaterally Cardio: regular rate and rhythm, S1, S2 normal, no murmur, click, rub or gallop GI: soft, non-tender; bowel sounds normal; no masses,  no organomegaly Extremities: extremities normal, atraumatic, no cyanosis or edema Neurologic: Alert and oriented X 3, normal strength and tone. Normal symmetric reflexes. Normal coordination and gait  ECOG PERFORMANCE STATUS: 0 - Asymptomatic  Blood pressure 123/77, pulse 90, temperature 97.5 F (36.4 C), temperature source Oral, resp. rate 20, height 5\' 4"  (1.626 m), weight 151 lb 1.6 oz (68.539 kg).  LABORATORY DATA: Lab Results  Component Value Date   WBC 9.7 09/10/2011   HGB 13.1 09/10/2011   HCT 39.7 09/10/2011   MCV 88.4 09/10/2011   PLT 354 09/10/2011      Chemistry      Component Value Date/Time   NA 135 08/20/2011 0953   K 4.4 08/20/2011 0953   CL 105 08/20/2011 0953   CO2 28 08/20/2011 0953   BUN 13 08/20/2011 0953   CREATININE 0.80 08/20/2011 0953      Component Value Date/Time   CALCIUM 9.4 08/20/2011 0953   ALKPHOS 73 08/20/2011 0953   AST 16 08/20/2011 0953   ALT 9 08/20/2011 0953   BILITOT 0.5 08/20/2011 0953       RADIOGRAPHIC STUDIES: Ct Chest W Contrast  09/06/2011  *RADIOLOGY REPORT*  Clinical Data:  Lung cancer diagnosed 2007.  Spine, brain, and omental metastases.  Restaging exam.  CT  CHEST, ABDOMEN AND PELVIS WITH CONTRAST  Technique:  Multidetector CT imaging of the chest, abdomen and pelvis was performed following the standard protocol during bolus administration of intravenous contrast.  Contrast: OMNIPAQUE IOHEXOL 300 MG/ML  SOLN  Comparison:  06/27/2011   CT CHEST  Findings:  There is a port in the right anterior chest wall.  No axillary or clavicular lymphadenopathy.  No mediastinal or hilar lymphadenopathy.  No pericardial fluid.  Postsurgical change in the right upper lobe volume loss of fibrotic thickening.  The pleural thickening in the posterior apex measures 2.0 x 1.4 cm compared to 2.2 x 1.4 cm on prior remeasured.  (Image #9).  cm compared to  IMPRESSION:  1.  No evidence of lung cancer recurrence in the thorax.  2.   Stable post therapy change in the right upper lobe with stable pleural thickening.   CT ABDOMEN AND PELVIS  Findings:  No focal hepatic lesion.  The gallbladder, pancreas, spleen, adrenal glands, and kidneys are normal.  The stomach, small bowel, appendix, and cecum are normal.  Colon rectosigmoid colon are normal.  No retroperitoneal or periportal lymphadenopathy.  No peritoneal disease.  No free fluid the pelvis.  The uterus and bladder and ovaries are normal.  No pelvic lymphadenopathy. Review of  bone windows demonstrates no aggressive osseous lesions.  Review of the bone windows demonstrates augmentation in the thoracic spine and chronic fracture.  IMPRESSION:  1.  No evidence of metastasis in the abdomen or pelvis.  2.   Stable findings in the spine.   Original Report Authenticated By: Genevive Bi, M.D.       ASSESSMENT: this is a very pleasant 61 years old white female with metastatic non-small cell lung cancer currently on maintenance chemotherapy with single agent Alimta status post 69 cycles. The patient has no evidence for disease progression on his recent scan.  PLAN: I discussed the scan results with the patient. I recommended for her  to continue on maintenance treatment with Alimta 500 mg/M2 every 3 weeks as scheduled. She will receive cycle #70 today. The patient would come back for followup visit in 3 weeks with the start of the next cycle of her treatment.  She was advised to call me immediately if she has any concerning symptoms in the interval.  All questions were answered. The patient knows to call the clinic with any problems, questions or concerns. We can certainly see the patient much sooner if necessary.  I spent 15 minutes counseling the patient face to face. The total time spent in the appointment was 25 minutes.

## 2011-09-16 ENCOUNTER — Encounter: Payer: Self-pay | Admitting: Dietician

## 2011-09-16 NOTE — Progress Notes (Signed)
Brief Out-patient Oncology Nutrition Note  Reason: Patient Screened Positive For Nutrition Risk For Unintentional Weight Loss and Decreased Appetite.   Danielle [name] is a 61 year old female patient of Dr. Shirline Frees, diagnosed with Lung Cancer. Contacted patient via telephone for positive nutrition risk. She reported her appetite and intake are well. She reported she eats regular meals daily. She stated she used to drink Boost nutrition supplement but does not anymore because she does not need to.   Wt Readings from Last 10 Encounters:  09/10/11 151 lb 1.6 oz (68.539 kg)  08/20/11 153 lb 8 oz (69.627 kg)  08/02/11 152 lb 8 oz (69.174 kg)  07/30/11 154 lb 4.8 oz (69.99 kg)  07/09/11 156 lb 11.2 oz (71.079 kg)  06/18/11 158 lb 4.8 oz (71.804 kg)  05/28/11 158 lb 9.6 oz (71.94 kg)  05/07/11 157 lb 14.4 oz (71.623 kg)  04/16/11 156 lb (70.761 kg)  03/26/11 155 lb 14.4 oz (70.716 kg)    I have encouraged the patient to have a diet adequate in calories and protein to promote weight maintenance. The patient was without any further nutrition related questions. Provided RD contact information and instructed patient to contact RD for future questions.   RD available for nutrition needs.   Iven Finn, MS, RD, LDN [phone]

## 2011-09-19 ENCOUNTER — Other Ambulatory Visit: Payer: Self-pay | Admitting: Medical Oncology

## 2011-09-19 DIAGNOSIS — C3490 Malignant neoplasm of unspecified part of unspecified bronchus or lung: Secondary | ICD-10-CM

## 2011-09-19 MED ORDER — MORPHINE SULFATE 30 MG PO TABS: 30.0000 mg | ORAL_TABLET | ORAL | Status: DC | PRN

## 2011-09-19 MED ORDER — MORPHINE SULFATE ER 60 MG PO TBCR: 60.0000 mg | EXTENDED_RELEASE_TABLET | Freq: Two times a day (BID) | ORAL | Status: DC

## 2011-09-19 MED ORDER — ALPRAZOLAM 0.25 MG PO TABS: 0.2500 mg | ORAL_TABLET | Freq: Every evening | ORAL | Status: DC | PRN

## 2011-09-19 NOTE — Telephone Encounter (Signed)
Requests refill for xanax and ms contin 60 and msir 30 - rx locked in injection room-pt said she will pick them up on monday

## 2011-10-01 ENCOUNTER — Ambulatory Visit (HOSPITAL_BASED_OUTPATIENT_CLINIC_OR_DEPARTMENT_OTHER): Payer: Medicare Other | Admitting: Physician Assistant

## 2011-10-01 ENCOUNTER — Other Ambulatory Visit (HOSPITAL_BASED_OUTPATIENT_CLINIC_OR_DEPARTMENT_OTHER): Payer: Medicare Other | Admitting: Lab

## 2011-10-01 ENCOUNTER — Telehealth: Payer: Self-pay | Admitting: Internal Medicine

## 2011-10-01 ENCOUNTER — Ambulatory Visit (HOSPITAL_BASED_OUTPATIENT_CLINIC_OR_DEPARTMENT_OTHER): Payer: Medicare Other

## 2011-10-01 VITALS — BP 136/78 | HR 73 | Temp 98.3°F | Resp 20 | Ht 64.0 in | Wt 154.7 lb

## 2011-10-01 DIAGNOSIS — C3490 Malignant neoplasm of unspecified part of unspecified bronchus or lung: Secondary | ICD-10-CM

## 2011-10-01 DIAGNOSIS — C7952 Secondary malignant neoplasm of bone marrow: Secondary | ICD-10-CM | POA: Diagnosis not present

## 2011-10-01 DIAGNOSIS — C7931 Secondary malignant neoplasm of brain: Secondary | ICD-10-CM | POA: Diagnosis not present

## 2011-10-01 DIAGNOSIS — C3410 Malignant neoplasm of upper lobe, unspecified bronchus or lung: Secondary | ICD-10-CM

## 2011-10-01 DIAGNOSIS — C7951 Secondary malignant neoplasm of bone: Secondary | ICD-10-CM | POA: Diagnosis not present

## 2011-10-01 DIAGNOSIS — Z23 Encounter for immunization: Secondary | ICD-10-CM

## 2011-10-01 DIAGNOSIS — Z5111 Encounter for antineoplastic chemotherapy: Secondary | ICD-10-CM

## 2011-10-01 DIAGNOSIS — C7949 Secondary malignant neoplasm of other parts of nervous system: Secondary | ICD-10-CM | POA: Diagnosis not present

## 2011-10-01 LAB — COMPREHENSIVE METABOLIC PANEL (CC13)
AST: 16 U/L (ref 5–34)
Albumin: 3.5 g/dL (ref 3.5–5.0)
BUN: 8 mg/dL (ref 7.0–26.0)
Calcium: 9.9 mg/dL (ref 8.4–10.4)
Chloride: 107 mEq/L (ref 98–107)
Glucose: 136 mg/dl — ABNORMAL HIGH (ref 70–99)
Potassium: 4.4 mEq/L (ref 3.5–5.1)

## 2011-10-01 LAB — CBC WITH DIFFERENTIAL/PLATELET
Basophils Absolute: 0 10*3/uL (ref 0.0–0.1)
Eosinophils Absolute: 0.1 10*3/uL (ref 0.0–0.5)
HGB: 12.4 g/dL (ref 11.6–15.9)
NEUT#: 3.9 10*3/uL (ref 1.5–6.5)
RDW: 15.9 % — ABNORMAL HIGH (ref 11.2–14.5)
lymph#: 1.9 10*3/uL (ref 0.9–3.3)

## 2011-10-01 MED ORDER — DEXAMETHASONE SODIUM PHOSPHATE 10 MG/ML IJ SOLN
10.0000 mg | Freq: Once | INTRAMUSCULAR | Status: AC
  Administered 2011-10-01: 10 mg via INTRAVENOUS

## 2011-10-01 MED ORDER — INFLUENZA VIRUS VACC SPLIT PF IM SUSP
0.5000 mL | Freq: Once | INTRAMUSCULAR | Status: AC
  Administered 2011-10-01: 0.5 mL via INTRAMUSCULAR
  Filled 2011-10-01: qty 0.5

## 2011-10-01 MED ORDER — SODIUM CHLORIDE 0.9 % IJ SOLN
10.0000 mL | INTRAMUSCULAR | Status: DC | PRN
  Administered 2011-10-01: 10 mL
  Filled 2011-10-01: qty 10

## 2011-10-01 MED ORDER — ONDANSETRON 8 MG/50ML IVPB (CHCC)
8.0000 mg | Freq: Once | INTRAVENOUS | Status: AC
  Administered 2011-10-01: 8 mg via INTRAVENOUS

## 2011-10-01 MED ORDER — HEPARIN SOD (PORK) LOCK FLUSH 100 UNIT/ML IV SOLN
500.0000 [IU] | Freq: Once | INTRAVENOUS | Status: AC | PRN
  Administered 2011-10-01: 500 [IU]
  Filled 2011-10-01: qty 5

## 2011-10-01 MED ORDER — SODIUM CHLORIDE 0.9 % IV SOLN
500.0000 mg/m2 | Freq: Once | INTRAVENOUS | Status: AC
  Administered 2011-10-01: 900 mg via INTRAVENOUS
  Filled 2011-10-01: qty 36

## 2011-10-01 MED ORDER — SODIUM CHLORIDE 0.9 % IV SOLN
Freq: Once | INTRAVENOUS | Status: AC
  Administered 2011-10-01: 12:00:00 via INTRAVENOUS

## 2011-10-01 NOTE — Telephone Encounter (Signed)
gv pt appt schedule for October and November. Per 9/24 pof lb/fu/chemo 10/8 but care plan calls for tx every 21 days and next date would be 10/15. Check w/AJ for clarity and per AJ pt is q3w and next lb/fu/tx should be 10/15.

## 2011-10-01 NOTE — Patient Instructions (Addendum)
[facility] Discharge Instructions for Patients Receiving Chemotherapy  Today you received the following chemotherapy agents Alimta.  To help prevent nausea and vomiting after your treatment, we encourage you to take your nausea medication.   If you develop nausea and vomiting that is not controlled by your nausea medication, call the clinic. If it is after clinic hours your family physician or the after hours number for the clinic or go to the Emergency Department.   BELOW ARE SYMPTOMS THAT SHOULD BE REPORTED IMMEDIATELY:  *FEVER GREATER THAN 100.5 F  *CHILLS WITH OR WITHOUT FEVER  NAUSEA AND VOMITING THAT IS NOT CONTROLLED WITH YOUR NAUSEA MEDICATION  *UNUSUAL SHORTNESS OF BREATH  *UNUSUAL BRUISING OR BLEEDING  TENDERNESS IN MOUTH AND THROAT WITH OR WITHOUT PRESENCE OF ULCERS  *URINARY PROBLEMS  *BOWEL PROBLEMS  UNUSUAL RASH Items with * indicate a potential emergency and should be followed up as soon as possible.  One of the nurses will contact you 24 hours after your treatment. Please let the nurse know about any problems that you may have experienced. Feel free to call the clinic you have any questions or concerns. The clinic phone number is [phone].   I have been informed and understand all the instructions given to me. I know to contact the clinic, my physician, or go to the Emergency Department if any problems should occur. I do not have any questions at this time, but understand that I may call the clinic during office hours   should I have any questions or need assistance in obtaining follow up care.    __________________________________________  _____________  __________ Signature of Patient or Authorized Representative            Date                   Time    __________________________________________ Nurse's Signature    

## 2011-10-01 NOTE — Patient Instructions (Addendum)
You will receive you flu vaccine today Follow up in 3 weeks prior to your next cycle of chemotherapy

## 2011-10-02 NOTE — Progress Notes (Addendum)
[facility] Telephone:(336) [phone]   Fax:([phone]) [phone]  OFFICE PROGRESS NOTE  [name], MD [address] [place] [address]  DIAGNOSIS: Metastatic non-small cell lung cancer initially diagnosed with locally advanced stage IIIB with right Pancoast tumor involving the vertebral body as well as the foraminal canal invasion with spinal cord compression in October of 2007.   PRIOR THERAPY:  1. Status post concurrent chemoradiation with weekly carboplatin and paclitaxel, last dose was given November 18, 2005. 2. 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 3. Status post gamma knife radiotherapy to a solitary brain lesion located in the superior frontal area of the brain at [facility] in April of 2008. 4. Status post palliative radiotherapy to the lateral abdominal wall metastatic lesion under the care of Dr. Mitzi Hansen, completed March of 2009. 5. Status post 6 cycles of systemic chemotherapy with carboplatin and Alimta. Last dose was given July 26, 2007 with disease stabilization.  CURRENT THERAPY: : Maintenance chemotherapy with Alimta 500 mg per meter squared given every 3 weeks. The patient is status post 70 cycles.  INTERVAL HISTORY: Danielle [name] 61 y.o. female returns to the clinic today for followup visit. She continues to tolerate her maintenance chemotherapy with Alimta without difficulty. She presents today with no specific complaints. She does not require any prescription refills today. She denied having any significant fever or chills, no nausea or vomiting. She denied having any significant chest pain, shortness breath, cough or hemoptysis. She has no weight loss or night sweats. Patient requests a flu vaccine.  MEDICAL HISTORY: Past Medical History  Diagnosis Date  . Hypertension   . Anemia   . H/O: pneumonia   . History of tobacco abuse quit  10/08    on nicotine patch  . Fibromyalgia   . Hypokalemia   . DJD (degenerative joint disease), cervical   . Thrush 12/11/2010  . 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 had chemo and radiation for lung ca    ALLERGIES:  is allergic to contrast media; co-trimoxazole injection; iohexol; and sulfa drugs cross reactors.  MEDICATIONS:  Current Outpatient Prescriptions  Medication Sig Dispense Refill  . ALPRAZolam (XANAX) 0.25 MG tablet Take 1 tablet (0.25 mg total) by mouth at bedtime as needed.  30 tablet  0  . amLODipine (NORVASC) 10 MG tablet Take 1 tablet (10 mg total) by mouth daily.  90 tablet  3  . fluconazole (DIFLUCAN) 100 MG tablet Take 2 tablets by mouth on day one, then take 1 tablet by mouth daily until completed  16 tablet  0  . folic acid (FOLVITE) 1 MG tablet Take 1 tablet (1 mg total) by mouth daily.  90 tablet  3  . lidocaine-prilocaine (EMLA) cream Apply topically as needed.  30 g  0  . lisinopril (PRINIVIL,ZESTRIL) 5 MG tablet TAKE 1 TABLET BY MOUTH EVERY DAY  90 tablet  0  . morphine (MS CONTIN) 60 MG 12 hr tablet Take 1 tablet (60 mg total) by mouth 2 (two) times daily.  60 tablet  0  . morphine (MSIR) 30 MG tablet Take 1 tablet (30 mg total) by mouth every 4 (four) hours as needed. Take 1 tab every 4-6 hours as needed for breakthrough pain  60 tablet  0  . ondansetron (ZOFRAN) 8 MG tablet Take 1 tablet (8 mg total)  by mouth every 8 (eight) hours as needed for nausea.  30 tablet  1  . PATANOL 0.1 % ophthalmic solution PUT 1 DROP IN AFFECTED EYE(S) 2X A DAY  5 mL  1  . predniSONE (DELTASONE) 50 MG tablet       . simvastatin (ZOCOR) 40 MG tablet Take 1 tablet (40 mg total) by mouth daily.  30 tablet  5   Current Facility-Administered Medications  Medication Dose Route Frequency Provider Last Rate Last Dose  . influenza  inactive virus vaccine (FLUZONE/FLUARIX) injection 0.5 mL  0.5 mL Intramuscular Once Conni Slipper, PA   0.5 mL at  10/01/11 1234   Facility-Administered Medications Ordered in Other Visits  Medication Dose Route Frequency Provider Last Rate Last Dose  . 0.9 %  sodium chloride infusion   Intravenous Once Si Gaul, MD      . dexamethasone (DECADRON) injection 10 mg  10 mg Intravenous Once Si Gaul, MD   10 mg at 10/01/11 1207  . heparin lock flush 100 unit/mL  500 Units Intracatheter Once PRN Si Gaul, MD   500 Units at 10/01/11 1243  . ondansetron (ZOFRAN) IVPB 8 mg  8 mg Intravenous Once Si Gaul, MD   8 mg at 10/01/11 1207  . PEMEtrexed (ALIMTA) 900 mg in sodium chloride 0.9 % 100 mL chemo infusion  500 mg/m2 (Treatment Plan Actual) Intravenous Once Si Gaul, MD   900 mg at 10/01/11 1223  . DISCONTD: sodium chloride 0.9 % injection 10 mL  10 mL Intracatheter PRN Si Gaul, MD   10 mL at 10/01/11 1243    SURGICAL HISTORY:  Past Surgical History  Procedure Date  . Cervical laminectomy 1995  . Other surgical history 2008    Gamma knife surgery to remove brain met   . 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 Knee surgery 1990    Left x 2  . Portacath placement -[name]    TIP IN CAVOATRIAL JUNCTION    REVIEW OF SYSTEMS:  A comprehensive review of systems was negative.   PHYSICAL EXAMINATION: General appearance: alert, cooperative and no distress Head: Normocephalic, without obvious abnormality, atraumatic Neck: no adenopathy Lymph nodes: Cervical, supraclavicular, and axillary nodes normal. Resp: clear to auscultation bilaterally Cardio: regular rate and rhythm, S1, S2 normal, no murmur, click, rub or gallop GI: soft, non-tender; bowel sounds normal; no masses,  no organomegaly Extremities: extremities normal, atraumatic, no cyanosis or edema Neurologic: Alert and oriented X 3, normal strength and tone. Normal symmetric reflexes. Normal coordination and gait  ECOG PERFORMANCE STATUS: 0 - Asymptomatic  Blood pressure 136/78,  pulse 73, temperature 98.3 F (36.8 C), temperature source Oral, resp. rate 20, height 5\' 4"  (1.626 m), weight 154 lb 11.2 oz (70.171 kg).  LABORATORY DATA: Lab Results  Component Value Date   WBC 6.8 10/01/2011   HGB 12.4 10/01/2011   HCT 37.2 10/01/2011   MCV 89.2 10/01/2011   PLT 306 10/01/2011      Chemistry      Component Value Date/Time   NA 144 10/01/2011 1038   NA 135 08/20/2011 0953   K 4.4 10/01/2011 1038   K 4.4 08/20/2011 0953   CL 107 10/01/2011 1038   CL 105 08/20/2011 0953   CO2 26 10/01/2011 1038   CO2 28 08/20/2011 0953   BUN 8.0 10/01/2011 1038   BUN 13 08/20/2011 0953   CREATININE 0.7 10/01/2011 1038   CREATININE 0.80 08/20/2011  1610      Component Value Date/Time   CALCIUM 9.9 10/01/2011 1038   CALCIUM 9.4 08/20/2011 0953   ALKPHOS 80 10/01/2011 1038   ALKPHOS 73 08/20/2011 0953   AST 16 10/01/2011 1038   AST 16 08/20/2011 0953   ALT 11 10/01/2011 1038   ALT 9 08/20/2011 0953   BILITOT 0.40 10/01/2011 1038   BILITOT 0.5 08/20/2011 0953       RADIOGRAPHIC STUDIES: Ct Chest W Contrast  09/06/2011  *RADIOLOGY REPORT*  Clinical Data:  Lung cancer diagnosed 2007.  Spine, brain, and omental metastases.  Restaging exam.  CT CHEST, ABDOMEN AND PELVIS WITH CONTRAST  Technique:  Multidetector CT imaging of the chest, abdomen and pelvis was performed following the standard protocol during bolus administration of intravenous contrast.  Contrast: OMNIPAQUE IOHEXOL 300 MG/ML  SOLN  Comparison:  06/27/2011   CT CHEST  Findings:  There is a port in the right anterior chest wall.  No axillary or clavicular lymphadenopathy.  No mediastinal or hilar lymphadenopathy.  No pericardial fluid.  Postsurgical change in the right upper lobe volume loss of fibrotic thickening.  The pleural thickening in the posterior apex measures 2.0 x 1.4 cm compared to 2.2 x 1.4 cm on prior remeasured.  (Image #9).  cm compared to  IMPRESSION:  1.  No evidence of lung cancer recurrence in the thorax.  2.    Stable post therapy change in the right upper lobe with stable pleural thickening.   CT ABDOMEN AND PELVIS  Findings:  No focal hepatic lesion.  The gallbladder, pancreas, spleen, adrenal glands, and kidneys are normal.  The stomach, small bowel, appendix, and cecum are normal.  Colon rectosigmoid colon are normal.  No retroperitoneal or periportal lymphadenopathy.  No peritoneal disease.  No free fluid the pelvis.  The uterus and bladder and ovaries are normal.  No pelvic lymphadenopathy. Review of  bone windows demonstrates no aggressive osseous lesions.  Review of the bone windows demonstrates augmentation in the thoracic spine and chronic fracture.  IMPRESSION:  1.  No evidence of metastasis in the abdomen or pelvis.  2.   Stable findings in the spine.   Original Report Authenticated By: Genevive Bi, M.D.       ASSESSMENT/PLAN: this is a very pleasant 62 years old white female with metastatic non-small cell lung cancer currently on maintenance chemotherapy with single agent Alimta status post 69 cycles. The patient has no evidence for disease progression on her recent scan. The patient was discussed with Dr. Arbutus Ped. She will proceed with cycle #7 day 1 of her maintenance treatment with single agent Alimta at 500 mg per meter squared given every 3 weeks. She'll return in 3 weeks with repeat CBC differential and C. met prior to cycle #72. The patient was given a flu vaccine today.  Laural Benes, Vikash Nest E, PA-C   All questions were answered. The patient knows to call the clinic with any problems, questions or concerns. We can certainly see the patient much sooner if necessary.  I spent 20 minutes counseling the patient face to face. The total time spent in the appointment was 30 minutes.

## 2011-10-21 ENCOUNTER — Encounter: Payer: Self-pay | Admitting: Pharmacist

## 2011-10-22 ENCOUNTER — Ambulatory Visit (HOSPITAL_BASED_OUTPATIENT_CLINIC_OR_DEPARTMENT_OTHER): Payer: Medicare Other | Admitting: Physician Assistant

## 2011-10-22 ENCOUNTER — Telehealth: Payer: Self-pay | Admitting: Internal Medicine

## 2011-10-22 ENCOUNTER — Other Ambulatory Visit (HOSPITAL_BASED_OUTPATIENT_CLINIC_OR_DEPARTMENT_OTHER): Payer: Medicare Other | Admitting: Lab

## 2011-10-22 ENCOUNTER — Ambulatory Visit (HOSPITAL_BASED_OUTPATIENT_CLINIC_OR_DEPARTMENT_OTHER): Payer: Medicare Other

## 2011-10-22 ENCOUNTER — Telehealth: Payer: Self-pay | Admitting: *Deleted

## 2011-10-22 ENCOUNTER — Encounter: Payer: Self-pay | Admitting: Physician Assistant

## 2011-10-22 ENCOUNTER — Other Ambulatory Visit: Payer: Medicare Other | Admitting: Lab

## 2011-10-22 VITALS — BP 119/69 | HR 80 | Temp 97.6°F | Resp 20 | Ht 64.0 in | Wt 155.1 lb

## 2011-10-22 DIAGNOSIS — C3490 Malignant neoplasm of unspecified part of unspecified bronchus or lung: Secondary | ICD-10-CM

## 2011-10-22 DIAGNOSIS — C3410 Malignant neoplasm of upper lobe, unspecified bronchus or lung: Secondary | ICD-10-CM

## 2011-10-22 DIAGNOSIS — C7949 Secondary malignant neoplasm of other parts of nervous system: Secondary | ICD-10-CM

## 2011-10-22 DIAGNOSIS — Z5111 Encounter for antineoplastic chemotherapy: Secondary | ICD-10-CM | POA: Diagnosis not present

## 2011-10-22 DIAGNOSIS — C779 Secondary and unspecified malignant neoplasm of lymph node, unspecified: Secondary | ICD-10-CM | POA: Diagnosis not present

## 2011-10-22 DIAGNOSIS — C7931 Secondary malignant neoplasm of brain: Secondary | ICD-10-CM | POA: Diagnosis not present

## 2011-10-22 DIAGNOSIS — C50919 Malignant neoplasm of unspecified site of unspecified female breast: Secondary | ICD-10-CM

## 2011-10-22 LAB — COMPREHENSIVE METABOLIC PANEL (CC13)
ALT: 14 U/L (ref 0–55)
AST: 15 U/L (ref 5–34)
Albumin: 3.4 g/dL — ABNORMAL LOW (ref 3.5–5.0)
BUN: 13 mg/dL (ref 7.0–26.0)
Calcium: 9.7 mg/dL (ref 8.4–10.4)
Chloride: 106 mEq/L (ref 98–107)
Potassium: 4.2 mEq/L (ref 3.5–5.1)
Sodium: 140 mEq/L (ref 136–145)
Total Protein: 6.2 g/dL — ABNORMAL LOW (ref 6.4–8.3)

## 2011-10-22 LAB — CBC WITH DIFFERENTIAL/PLATELET
BASO%: 0.3 % (ref 0.0–2.0)
Basophils Absolute: 0 10*3/uL (ref 0.0–0.1)
EOS%: 0.6 % (ref 0.0–7.0)
HGB: 11.8 g/dL (ref 11.6–15.9)
MCH: 29.9 pg (ref 25.1–34.0)
NEUT#: 6.7 10*3/uL — ABNORMAL HIGH (ref 1.5–6.5)
RDW: 15.2 % — ABNORMAL HIGH (ref 11.2–14.5)
lymph#: 1.9 10*3/uL (ref 0.9–3.3)

## 2011-10-22 MED ORDER — CYANOCOBALAMIN 1000 MCG/ML IJ SOLN
1000.0000 ug | Freq: Once | INTRAMUSCULAR | Status: AC
  Administered 2011-10-22: 1000 ug via INTRAMUSCULAR

## 2011-10-22 MED ORDER — DEXAMETHASONE SODIUM PHOSPHATE 10 MG/ML IJ SOLN
10.0000 mg | Freq: Once | INTRAMUSCULAR | Status: AC
  Administered 2011-10-22: 10 mg via INTRAVENOUS

## 2011-10-22 MED ORDER — MORPHINE SULFATE ER 60 MG PO TBCR: 60.0000 mg | EXTENDED_RELEASE_TABLET | Freq: Two times a day (BID) | ORAL | Status: DC

## 2011-10-22 MED ORDER — SODIUM CHLORIDE 0.9 % IV SOLN
500.0000 mg/m2 | Freq: Once | INTRAVENOUS | Status: AC
  Administered 2011-10-22: 900 mg via INTRAVENOUS
  Filled 2011-10-22: qty 36

## 2011-10-22 MED ORDER — SODIUM CHLORIDE 0.9 % IV SOLN
Freq: Once | INTRAVENOUS | Status: AC
  Administered 2011-10-22: 11:00:00 via INTRAVENOUS

## 2011-10-22 MED ORDER — MORPHINE SULFATE 30 MG PO TABS: 30.0000 mg | ORAL_TABLET | ORAL | Status: DC | PRN

## 2011-10-22 MED ORDER — ONDANSETRON 8 MG/50ML IVPB (CHCC)
8.0000 mg | Freq: Once | INTRAVENOUS | Status: AC
  Administered 2011-10-22: 8 mg via INTRAVENOUS

## 2011-10-22 MED ORDER — ALPRAZOLAM 0.25 MG PO TABS: 0.2500 mg | ORAL_TABLET | Freq: Every evening | ORAL | Status: DC | PRN

## 2011-10-22 NOTE — Patient Instructions (Addendum)
Follow up with Dr. Mohamed in 3 weeks with a restaging CT scan of your chest, abdomen and pelvis to re-evaluate your disease 

## 2011-10-22 NOTE — Telephone Encounter (Signed)
appts made and printed for pt  °

## 2011-10-22 NOTE — Progress Notes (Signed)
[facility] Telephone:(336) [phone]   Fax:(336) [phone]  OFFICE PROGRESS NOTE  [name], MD [address] [place] [address]  DIAGNOSIS: Metastatic non-small cell lung cancer initially diagnosed with locally advanced stage IIIB with right Pancoast tumor involving the vertebral body as well as the foraminal canal invasion with spinal cord compression in October of 2007.   PRIOR THERAPY:  1. Status post concurrent chemoradiation with weekly carboplatin and paclitaxel, last dose was given November 18, 2005. 2. 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 3. Status post gamma knife radiotherapy to a solitary brain lesion located in the superior frontal area of the brain at [facility] in April of 2008. 4. Status post palliative radiotherapy to the lateral abdominal wall metastatic lesion under the care of Dr. Mitzi Hansen, completed March of 2009. 5. Status post 6 cycles of systemic chemotherapy with carboplatin and Alimta. Last dose was given July 26, 2007 with disease stabilization.  CURRENT THERAPY: : Maintenance chemotherapy with Alimta 500 mg per meter squared given every 3 weeks. The patient is status post 71 cycles.  INTERVAL HISTORY: Danielle [name] 61 y.o. female returns to the clinic today for followup visit. She continues to tolerate her maintenance chemotherapy with Alimta without difficulty. She presents today with no specific complaints. She requests refills for her MS Contin, MSIR, and Xanax.  She denied having any significant fever or chills, no nausea or vomiting. She denied having any significant chest pain, shortness breath, cough or hemoptysis. She has no weight loss or night sweats.   MEDICAL HISTORY: Past Medical History  Diagnosis Date  . Hypertension   . Anemia   . H/O: pneumonia   . History of tobacco abuse quit 10/08    on nicotine  patch  . Fibromyalgia   . Hypokalemia   . DJD (degenerative joint disease), cervical   . Thrush 12/11/2010  . 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 had chemo and radiation for lung ca    ALLERGIES:  is allergic to contrast media; co-trimoxazole injection; iohexol; and sulfa drugs cross reactors.  MEDICATIONS:  Current Outpatient Prescriptions  Medication Sig Dispense Refill  . ALPRAZolam (XANAX) 0.25 MG tablet Take 1 tablet (0.25 mg total) by mouth at bedtime as needed.  30 tablet  0  . amLODipine (NORVASC) 10 MG tablet Take 1 tablet (10 mg total) by mouth daily.  90 tablet  3  . fluconazole (DIFLUCAN) 100 MG tablet Take 2 tablets by mouth on day one, then take 1 tablet by mouth daily until completed  16 tablet  0  . folic acid (FOLVITE) 1 MG tablet Take 1 tablet (1 mg total) by mouth daily.  90 tablet  3  . lidocaine-prilocaine (EMLA) cream Apply topically as needed.  30 g  0  . lisinopril (PRINIVIL,ZESTRIL) 5 MG tablet TAKE 1 TABLET BY MOUTH EVERY DAY  90 tablet  0  . morphine (MS CONTIN) 60 MG 12 hr tablet Take 1 tablet (60 mg total) by mouth 2 (two) times daily.  60 tablet  0  . morphine (MSIR) 30 MG tablet Take 1 tablet (30 mg total) by mouth every 4 (four) hours as needed. Take 1 tab every 4-6 hours as needed for breakthrough pain  60 tablet  0  . ondansetron (ZOFRAN) 8 MG tablet Take 1 tablet (8 mg total) by  mouth every 8 (eight) hours as needed for nausea.  30 tablet  1  . PATANOL 0.1 % ophthalmic solution PUT 1 DROP IN AFFECTED EYE(S) 2X A DAY  5 mL  1  . predniSONE (DELTASONE) 50 MG tablet       . simvastatin (ZOCOR) 40 MG tablet Take 1 tablet (40 mg total) by mouth daily.  30 tablet  5   No current facility-administered medications for this visit.   Facility-Administered Medications Ordered in Other Visits  Medication Dose Route Frequency Provider Last Rate Last Dose  . 0.9 %  sodium chloride infusion   Intravenous Once Si Gaul, MD        . cyanocobalamin ((VITAMIN B-12)) injection 1,000 mcg  1,000 mcg Intramuscular Once Si Gaul, MD   1,000 mcg at 10/22/11 1116  . dexamethasone (DECADRON) injection 10 mg  10 mg Intravenous Once Si Gaul, MD   10 mg at 10/22/11 1116  . ondansetron (ZOFRAN) IVPB 8 mg  8 mg Intravenous Once Si Gaul, MD   8 mg at 10/22/11 1116  . PEMEtrexed (ALIMTA) 900 mg in sodium chloride 0.9 % 100 mL chemo infusion  500 mg/m2 (Treatment Plan Actual) Intravenous Once Si Gaul, MD   900 mg at 10/22/11 1147    SURGICAL HISTORY:  Past Surgical History  Procedure Date  . Cervical laminectomy 1995  . Other surgical history 2008    Gamma knife surgery to remove brain met   . 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 Knee surgery 1990    Left x 2  . Portacath placement -[name]    TIP IN CAVOATRIAL JUNCTION    REVIEW OF SYSTEMS:  A comprehensive review of systems was negative.   PHYSICAL EXAMINATION: General appearance: alert, cooperative and no distress Head: Normocephalic, without obvious abnormality, atraumatic Neck: no adenopathy Lymph nodes: Cervical, supraclavicular, and axillary nodes normal. Resp: clear to auscultation bilaterally Cardio: regular rate and rhythm, S1, S2 normal, no murmur, click, rub or gallop GI: soft, non-tender; bowel sounds normal; no masses,  no organomegaly Extremities: extremities normal, atraumatic, no cyanosis or edema Neurologic: Alert and oriented X 3, normal strength and tone. Normal symmetric reflexes. Normal coordination and gait Mouth: No evidence of thrush  ECOG PERFORMANCE STATUS: 0 - Asymptomatic  Blood pressure 119/69, pulse 80, temperature 97.6 F (36.4 C), temperature source Oral, resp. rate 20, height 5\' 4"  (1.626 m), weight 155 lb 1.6 oz (70.353 kg).  LABORATORY DATA: Lab Results  Component Value Date   WBC 9.6 10/22/2011   HGB 11.8 10/22/2011   HCT 35.5 10/22/2011   MCV 89.8 10/22/2011   PLT  324 10/22/2011      Chemistry      Component Value Date/Time   NA 140 10/22/2011 1022   NA 135 08/20/2011 0953   K 4.2 10/22/2011 1022   K 4.4 08/20/2011 0953   CL 106 10/22/2011 1022   CL 105 08/20/2011 0953   CO2 27 10/22/2011 1022   CO2 28 08/20/2011 0953   BUN 13.0 10/22/2011 1022   BUN 13 08/20/2011 0953   CREATININE 0.7 10/22/2011 1022   CREATININE 0.80 08/20/2011 0953      Component Value Date/Time   CALCIUM 9.7 10/22/2011 1022   CALCIUM 9.4 08/20/2011 0953   ALKPHOS 74 10/22/2011 1022   ALKPHOS 73 08/20/2011 0953   AST 15 10/22/2011 1022   AST 16 08/20/2011 0953   ALT 14 10/22/2011 1022  ALT 9 08/20/2011 0953   BILITOT 0.40 10/22/2011 1022   BILITOT 0.5 08/20/2011 0953       RADIOGRAPHIC STUDIES: Ct Chest W Contrast  09/06/2011  *RADIOLOGY REPORT*  Clinical Data:  Lung cancer diagnosed 2007.  Spine, brain, and omental metastases.  Restaging exam.  CT CHEST, ABDOMEN AND PELVIS WITH CONTRAST  Technique:  Multidetector CT imaging of the chest, abdomen and pelvis was performed following the standard protocol during bolus administration of intravenous contrast.  Contrast: OMNIPAQUE IOHEXOL 300 MG/ML  SOLN  Comparison:  06/27/2011   CT CHEST  Findings:  There is a port in the right anterior chest wall.  No axillary or clavicular lymphadenopathy.  No mediastinal or hilar lymphadenopathy.  No pericardial fluid.  Postsurgical change in the right upper lobe volume loss of fibrotic thickening.  The pleural thickening in the posterior apex measures 2.0 x 1.4 cm compared to 2.2 x 1.4 cm on prior remeasured.  (Image #9).  cm compared to  IMPRESSION:  1.  No evidence of lung cancer recurrence in the thorax.  2.   Stable post therapy change in the right upper lobe with stable pleural thickening.   CT ABDOMEN AND PELVIS  Findings:  No focal hepatic lesion.  The gallbladder, pancreas, spleen, adrenal glands, and kidneys are normal.  The stomach, small bowel, appendix, and cecum are normal.   Colon rectosigmoid colon are normal.  No retroperitoneal or periportal lymphadenopathy.  No peritoneal disease.  No free fluid the pelvis.  The uterus and bladder and ovaries are normal.  No pelvic lymphadenopathy. Review of  bone windows demonstrates no aggressive osseous lesions.  Review of the bone windows demonstrates augmentation in the thoracic spine and chronic fracture.  IMPRESSION:  1.  No evidence of metastasis in the abdomen or pelvis.  2.   Stable findings in the spine.   Original Report Authenticated By: Genevive Bi, M.D.       ASSESSMENT/PLAN: this is a very pleasant 61 years old white female with metastatic non-small cell lung cancer currently on maintenance chemotherapy with single agent Alimta status post 69 cycles. The patient has no evidence for disease progression on her recent scan. The patient was discussed with Dr. Arbutus Ped. She will proceed with cycle #72 of her maintenance treatment with single agent Alimta at 500 mg per meter squared given every 3 weeks. She'll followup with Dr. Arbutus Ped in 3 weeks with repeat CBC differential, C. met and CT of the chest abdomen and pelvis with contrast to reevaluate her disease prior to receiving cycle #73 of her maintenance chemotherapy with single agent Alimta. The patient knows to take her prednisone and Benadryl as premedication prior to her CT scan with contrast.    Danielle [name] E, PA-C   All questions were answered. The patient knows to call the clinic with any problems, questions or concerns. We can certainly see the patient much sooner if necessary.  I spent 20 minutes counseling the patient face to face. The total time spent in the appointment was 30 minutes.

## 2011-10-22 NOTE — Telephone Encounter (Signed)
Per staff message and POF I have adjusted appt. JMW  

## 2011-10-28 DIAGNOSIS — C7931 Secondary malignant neoplasm of brain: Secondary | ICD-10-CM | POA: Insufficient documentation

## 2011-10-28 DIAGNOSIS — C7949 Secondary malignant neoplasm of other parts of nervous system: Secondary | ICD-10-CM | POA: Diagnosis not present

## 2011-10-28 DIAGNOSIS — Z8669 Personal history of other diseases of the nervous system and sense organs: Secondary | ICD-10-CM | POA: Diagnosis not present

## 2011-10-28 DIAGNOSIS — C801 Malignant (primary) neoplasm, unspecified: Secondary | ICD-10-CM | POA: Diagnosis not present

## 2011-11-08 ENCOUNTER — Encounter (HOSPITAL_COMMUNITY): Payer: Self-pay

## 2011-11-08 ENCOUNTER — Ambulatory Visit (HOSPITAL_COMMUNITY)
Admission: RE | Admit: 2011-11-08 | Discharge: 2011-11-08 | Disposition: A | Discharge status: Home / Self Care | Payer: Medicare Other | Source: Ambulatory Visit | Attending: Physician Assistant | Admitting: Physician Assistant

## 2011-11-08 DIAGNOSIS — C7949 Secondary malignant neoplasm of other parts of nervous system: Secondary | ICD-10-CM | POA: Diagnosis not present

## 2011-11-08 DIAGNOSIS — Z923 Personal history of irradiation: Secondary | ICD-10-CM | POA: Insufficient documentation

## 2011-11-08 DIAGNOSIS — C3490 Malignant neoplasm of unspecified part of unspecified bronchus or lung: Secondary | ICD-10-CM | POA: Diagnosis not present

## 2011-11-08 DIAGNOSIS — C7951 Secondary malignant neoplasm of bone: Secondary | ICD-10-CM | POA: Diagnosis not present

## 2011-11-08 DIAGNOSIS — C7931 Secondary malignant neoplasm of brain: Secondary | ICD-10-CM | POA: Insufficient documentation

## 2011-11-08 DIAGNOSIS — C786 Secondary malignant neoplasm of retroperitoneum and peritoneum: Secondary | ICD-10-CM | POA: Diagnosis not present

## 2011-11-08 DIAGNOSIS — I2510 Atherosclerotic heart disease of native coronary artery without angina pectoris: Secondary | ICD-10-CM | POA: Insufficient documentation

## 2011-11-08 DIAGNOSIS — J438 Other emphysema: Secondary | ICD-10-CM | POA: Diagnosis not present

## 2011-11-08 MED ORDER — IOHEXOL 300 MG/ML  SOLN
100.0000 mL | Freq: Once | INTRAMUSCULAR | Status: AC | PRN
  Administered 2011-11-08: 100 mL via INTRAVENOUS

## 2011-11-12 ENCOUNTER — Other Ambulatory Visit: Payer: Medicare Other | Admitting: Lab

## 2011-11-12 ENCOUNTER — Telehealth: Payer: Self-pay | Admitting: Internal Medicine

## 2011-11-12 ENCOUNTER — Ambulatory Visit (HOSPITAL_BASED_OUTPATIENT_CLINIC_OR_DEPARTMENT_OTHER): Payer: Medicare Other

## 2011-11-12 ENCOUNTER — Other Ambulatory Visit (HOSPITAL_BASED_OUTPATIENT_CLINIC_OR_DEPARTMENT_OTHER): Payer: Medicare Other | Admitting: Lab

## 2011-11-12 ENCOUNTER — Ambulatory Visit (HOSPITAL_BASED_OUTPATIENT_CLINIC_OR_DEPARTMENT_OTHER): Payer: Medicare Other | Admitting: Internal Medicine

## 2011-11-12 VITALS — BP 124/72 | HR 77 | Temp 97.2°F | Resp 20 | Ht 64.0 in | Wt 156.5 lb

## 2011-11-12 DIAGNOSIS — C3410 Malignant neoplasm of upper lobe, unspecified bronchus or lung: Secondary | ICD-10-CM

## 2011-11-12 DIAGNOSIS — C7951 Secondary malignant neoplasm of bone: Secondary | ICD-10-CM

## 2011-11-12 DIAGNOSIS — C7949 Secondary malignant neoplasm of other parts of nervous system: Secondary | ICD-10-CM

## 2011-11-12 DIAGNOSIS — C7931 Secondary malignant neoplasm of brain: Secondary | ICD-10-CM

## 2011-11-12 DIAGNOSIS — C3490 Malignant neoplasm of unspecified part of unspecified bronchus or lung: Secondary | ICD-10-CM

## 2011-11-12 DIAGNOSIS — Z5111 Encounter for antineoplastic chemotherapy: Secondary | ICD-10-CM | POA: Diagnosis not present

## 2011-11-12 LAB — CBC WITH DIFFERENTIAL/PLATELET
BASO%: 0.3 % (ref 0.0–2.0)
EOS%: 0.6 % (ref 0.0–7.0)
MCH: 30.2 pg (ref 25.1–34.0)
MCHC: 33.8 g/dL (ref 31.5–36.0)
MONO%: 12 % (ref 0.0–14.0)
RBC: 4.17 10*6/uL (ref 3.70–5.45)
RDW: 15.1 % — ABNORMAL HIGH (ref 11.2–14.5)
lymph#: 2 10*3/uL (ref 0.9–3.3)

## 2011-11-12 LAB — COMPREHENSIVE METABOLIC PANEL (CC13)
AST: 21 U/L (ref 5–34)
Albumin: 3.6 g/dL (ref 3.5–5.0)
Alkaline Phosphatase: 81 U/L (ref 40–150)
BUN: 9 mg/dL (ref 7.0–26.0)
Glucose: 126 mg/dl — ABNORMAL HIGH (ref 70–99)
Potassium: 4.1 mEq/L (ref 3.5–5.1)
Total Bilirubin: 0.29 mg/dL (ref 0.20–1.20)

## 2011-11-12 MED ORDER — SODIUM CHLORIDE 0.9 % IJ SOLN
10.0000 mL | INTRAMUSCULAR | Status: DC | PRN
  Administered 2011-11-12: 10 mL
  Filled 2011-11-12: qty 10

## 2011-11-12 MED ORDER — HEPARIN SOD (PORK) LOCK FLUSH 100 UNIT/ML IV SOLN
500.0000 [IU] | Freq: Once | INTRAVENOUS | Status: AC | PRN
  Administered 2011-11-12: 500 [IU]
  Filled 2011-11-12: qty 5

## 2011-11-12 MED ORDER — SODIUM CHLORIDE 0.9 % IV SOLN
500.0000 mg/m2 | Freq: Once | INTRAVENOUS | Status: AC
  Administered 2011-11-12: 900 mg via INTRAVENOUS
  Filled 2011-11-12: qty 36

## 2011-11-12 MED ORDER — DEXAMETHASONE SODIUM PHOSPHATE 10 MG/ML IJ SOLN
10.0000 mg | Freq: Once | INTRAMUSCULAR | Status: AC
  Administered 2011-11-12: 10 mg via INTRAVENOUS

## 2011-11-12 MED ORDER — SODIUM CHLORIDE 0.9 % IV SOLN
Freq: Once | INTRAVENOUS | Status: AC
  Administered 2011-11-12: 11:00:00 via INTRAVENOUS

## 2011-11-12 MED ORDER — ONDANSETRON 8 MG/50ML IVPB (CHCC)
8.0000 mg | Freq: Once | INTRAVENOUS | Status: AC
  Administered 2011-11-12: 8 mg via INTRAVENOUS

## 2011-11-12 NOTE — Patient Instructions (Signed)
No evidence for disease progression on his recent scan. Continue her maintenance Alimta. Followup in 3 weeks.

## 2011-11-12 NOTE — Telephone Encounter (Signed)
Gave pt appt for lab, ML and chemo on 02/02/11 emailed Marcelino Duster regarding chemo on December

## 2011-11-12 NOTE — Progress Notes (Signed)
[facility] [phone]   [phone]  OFFICE PROGRESS NOTE  [name], MD [address] [place] [address]  DIAGNOSIS: Metastatic non-small cell lung cancer initially diagnosed with locally advanced stage IIIB with right Pancoast tumor involving the vertebral body as well as the foraminal canal invasion with spinal cord compression in October of 2007.   PRIOR THERAPY:  1. Status post concurrent chemoradiation with weekly carboplatin and paclitaxel, last dose was given November 18, 2005. 2. 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 3. Status post gamma knife radiotherapy to a solitary brain lesion located in the superior frontal area of the brain at [facility] in April of 2008. 4. Status post palliative radiotherapy to the lateral abdominal wall metastatic lesion under the care of Dr. Mitzi Hansen, completed March of 2009. 5. Status post 6 cycles of systemic chemotherapy with carboplatin and Alimta. Last dose was given July 26, 2007 with disease stabilization.  CURRENT THERAPY: : Maintenance chemotherapy with Alimta 500 mg per meter squared given every 3 weeks. The patient is status post 72 cycles.  INTERVAL HISTORY: Danielle [name] 61 y.o. female returns to the clinic today for followup visit accompanied by her sister. The patient is feeling fine today with no specific complaints. She denied having any significant nausea or vomiting, no fever or chills. She denied having any significant weight loss or night sweats. She has no chest pain, shortness breath, cough or hemoptysis. She is tolerating her treatment with maintenance Alimta fairly well. She had repeat CT scan of the chest, abdomen and pelvis performed recently and she is here today for evaluation and discussion of her scan results.  MEDICAL HISTORY: Past Medical History  Diagnosis  Date  . Hypertension   . Anemia   . H/O: pneumonia   . History of tobacco abuse quit 10/08    on nicotine patch  . Fibromyalgia   . Hypokalemia   . DJD (degenerative joint disease), cervical   . Thrush 12/11/2010  . 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 had chemo and radiation for lung ca    ALLERGIES:  is allergic to contrast media; co-trimoxazole injection; iohexol; and sulfa drugs cross reactors.  MEDICATIONS:  Current Outpatient Prescriptions  Medication Sig Dispense Refill  . ALPRAZolam (XANAX) 0.25 MG tablet Take 1 tablet (0.25 mg total) by mouth at bedtime as needed.  30 tablet  0  . amLODipine (NORVASC) 10 MG tablet Take 1 tablet (10 mg total) by mouth daily.  90 tablet  3  . folic acid (FOLVITE) 1 MG tablet Take 1 tablet (1 mg total) by mouth daily.  90 tablet  3  . lidocaine-prilocaine (EMLA) cream Apply topically as needed.  30 g  0  . lisinopril (PRINIVIL,ZESTRIL) 5 MG tablet TAKE 1 TABLET BY MOUTH EVERY DAY  90 tablet  0  . morphine (MS CONTIN) 60 MG 12 hr tablet Take 1 tablet (60 mg total) by mouth 2 (two) times daily.  60 tablet  0  . morphine (MSIR) 30 MG tablet Take 1 tablet (30 mg total) by mouth every 4 (four) hours as needed. Take 1 tab every 4-6 hours as needed for breakthrough pain  60 tablet  0  . ondansetron (ZOFRAN) 8 MG tablet Take 1 tablet (8 mg total) by mouth every 8 (eight) hours as needed for nausea.  30 tablet  1  . PATANOL 0.1 % ophthalmic solution PUT 1 DROP IN AFFECTED EYE(S) 2X A DAY  5 mL  1  . simvastatin (ZOCOR) 40 MG tablet Take 1 tablet (40 mg total) by mouth daily.  30 tablet  5  . predniSONE (DELTASONE) 50 MG tablet 13 hrs, 7 hrs, and 1 hr before scheduled CT scans        SURGICAL HISTORY:  Past Surgical History  Procedure Date  . Cervical laminectomy 1995  . Other surgical history 2008    Gamma knife surgery to remove brain met   . 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 Knee  surgery 1990    Left x 2  . Portacath placement -[name]    TIP IN CAVOATRIAL JUNCTION    REVIEW OF SYSTEMS:  A comprehensive review of systems was negative.   PHYSICAL EXAMINATION: General appearance: alert, cooperative and no distress Head: Normocephalic, without obvious abnormality, atraumatic Neck: no adenopathy Lymph nodes: Cervical, supraclavicular, and axillary nodes normal. Resp: clear to auscultation bilaterally Cardio: regular rate and rhythm, S1, S2 normal, no murmur, click, rub or gallop GI: soft, non-tender; bowel sounds normal; no masses,  no organomegaly Extremities: extremities normal, atraumatic, no cyanosis or edema Neurologic: Alert and oriented X 3, normal strength and tone. Normal symmetric reflexes. Normal coordination and gait  ECOG PERFORMANCE STATUS: 0 - Asymptomatic  Blood pressure 124/72, pulse 77, temperature 97.2 F (36.2 C), temperature source Oral, resp. rate 20, height 5\' 4"  (1.626 m), weight 156 lb 8 oz (70.988 kg).  LABORATORY DATA: Lab Results  Component Value Date   WBC 9.4 11/12/2011   HGB 12.6 11/12/2011   HCT 37.2 11/12/2011   MCV 89.4 11/12/2011   PLT 316 11/12/2011      Chemistry      Component Value Date/Time   NA 140 11/12/2011 0918   NA 135 08/20/2011 0953   K 4.1 11/12/2011 0918   K 4.4 08/20/2011 0953   CL 105 11/12/2011 0918   CL 105 08/20/2011 0953   CO2 29 11/12/2011 0918   CO2 28 08/20/2011 0953   BUN 9.0 11/12/2011 0918   BUN 13 08/20/2011 0953   CREATININE 0.7 11/12/2011 0918   CREATININE 0.80 08/20/2011 0953      Component Value Date/Time   CALCIUM 9.8 11/12/2011 0918   CALCIUM 9.4 08/20/2011 0953   ALKPHOS 81 11/12/2011 0918   ALKPHOS 73 08/20/2011 0953   AST 21 11/12/2011 0918   AST 16 08/20/2011 0953   ALT 12 11/12/2011 0918   ALT 9 08/20/2011 0953   BILITOT 0.29 11/12/2011 0918   BILITOT 0.5 08/20/2011 0953       RADIOGRAPHIC STUDIES: Ct Chest W Contrast  11/08/2011  *RADIOLOGY REPORT*  Clinical Data:  Lung cancer with  metastasis to the brain, spine, and omentum with radiation therapy to groin completed in 2009. Gamma knife to brain in 2008.  Ongoing chemotherapy.  History of spontaneous right-sided pneumothorax.  CT CHEST, ABDOMEN AND PELVIS WITH CONTRAST  Technique: Contiguous axial images of the chest abdomen and pelvis were obtained after IV contrast administration.  Contrast: 100  ml Omnipaque-300  Comparison: 09/06/2011   CT CHEST  Findings: Lung windows demonstrate moderate centrilobular emphysema.  Right apical radiation fibrosis, similar in appearance to on the prior exam.  An area of presumed pleural based scarring within the anterior right upper lobe is unchanged at 7 mm on image 16.  Posterior pleural based soft tissue density in the right apex measures 2.2 x 1.1 cm today versus 2.0 x 1.4 cm on the prior.  Soft tissue windows demonstrate no supraclavicular adenopathy.  A right-sided Port-A-Cath which terminates at the mid right atrium.  Advanced aortic atherosclerosis. Normal heart size without pericardial or pleural effusion.  LAD coronary artery atherosclerosis on image 61; age advanced.  No central pulmonary embolism, on this non-dedicated study.  No mediastinal or hilar adenopathy.  IMPRESSION:  1.  Radiation change in the right upper lobe/apex without evidence of recurrent or metastatic disease. 2. Age advanced coronary artery atherosclerosis.  Recommend assessment of coronary risk factors and consideration of medical therapy. 3.  Moderate centrilobular emphysema. 4.  Similar appearance of vertebral augmentation at T7 and T4 with a T3 severe compression deformity.   CT ABDOMEN AND PELVIS  Findings:  Normal liver, spleen, stomach, pancreas, gallbladder, biliary tract, adrenal glands, right kidney.  Too small to characterize lower pole left renal lesion which is likely a cyst. There is subtle enhancement of the uroepithelium of the proximal ureters, including on images 72 and 74.  This is similar to on the prior  exam.  No retroperitoneal or retrocrural adenopathy.  Moderate amount of stool within the rectum and sigmoid.  The descending colon is underdistended.  Apparent wall thickening is favored to be secondary and artifactual on image 83.  No surrounding inflammation.  Normal terminal ileum and appendix.  Normal small bowel without abdominal ascites.    No pelvic adenopathy.    No evidence of omental or peritoneal disease.  Retroverted uterus.  Normal urinary bladder. No adnexal mass or significant free fluid.  Minimal subchondral sclerosis within the femoral heads bilaterally.  This is unchanged.  Example on the left on image 109 and on the right on image 112.  Status post vertebral augmentation at T7 and T4, with severe compression deformity at T3.  Focal kyphosis about the T3 level.  Appearance is overall similar.  IMPRESSION:  1. No acute process or evidence of metastatic disease in the abdomen or pelvis. 2.  Apparent descending colonic wall thickening could be due to underdistension.  Correlate with any symptoms to suggest colitis. 3. Possible constipation. 4.  Uroepithelial enhancement is subtle and nonspecific.  This has been present on prior exams.  Correlate with any symptoms to suggest ascending urinary tract infection. 5. 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   Original Report Authenticated By: Jeronimo Greaves, M.D.    ASSESSMENT: This is a very pleasant 61 years old white female with metastatic non-small cell lung cancer, adenocarcinoma currently on treatment with maintenance Alimta status post 72 cycles. The patient is tolerating her treatment fairly well. She has no evidence for disease progression on his recent scan of the chest, abdomen and pelvis. Her MRI of the brain performed at [facility] recently showed also no evidence for disease progression in the brain.  PLAN: I discussed the scan results with the patient and her sister. I recommended for her to continue on the same  treatment regimen with maintenance Alimta. She will start cycle #73 today. The patient would come back for followup visit in 3 weeks with the start of the next cycle of her chemotherapy. She was advised to call me immediately if she has any concerning symptoms in the interval.  All questions were answered. The patient knows to call the clinic with any problems, questions or concerns. We can certainly see the patient much sooner if  necessary.  I spent 15 minutes counseling the patient face to face. The total time spent in the appointment was 25 minutes.

## 2011-11-12 NOTE — Patient Instructions (Signed)
[facility] Discharge Instructions for Patients Receiving Chemotherapy  Today you received the following chemotherapy agents Alimta To help prevent nausea and vomiting after your treatment, we encourage you to take your nausea medication   Take it as often as prescribed.   If you develop nausea and vomiting that is not controlled by your nausea medication, call the clinic. If it is after clinic hours your family physician or the after hours number for the clinic or go to the Emergency Department.   BELOW ARE SYMPTOMS THAT SHOULD BE REPORTED IMMEDIATELY:  *FEVER GREATER THAN 100.5 F  *CHILLS WITH OR WITHOUT FEVER  NAUSEA AND VOMITING THAT IS NOT CONTROLLED WITH YOUR NAUSEA MEDICATION  *UNUSUAL SHORTNESS OF BREATH  *UNUSUAL BRUISING OR BLEEDING  TENDERNESS IN MOUTH AND THROAT WITH OR WITHOUT PRESENCE OF ULCERS  *URINARY PROBLEMS  *BOWEL PROBLEMS  UNUSUAL RASH Items with * indicate a potential emergency and should be followed up as soon as possible.  If this is your first treatment one of the nurses will contact you 24 hours after your treatment. Please let the nurse know about any problems that you may have experienced. Feel free to call the clinic you have any questions or concerns. The clinic phone number is [phone].   I have been informed and understand all the instructions given to me. I know to contact the clinic, my physician, or go to the Emergency Department if any problems should occur. I do not have any questions at this time, but understand that I may call the clinic during office hours   should I have any questions or need assistance in obtaining follow up care.    __________________________________________  _____________  __________ Signature of Patient or Authorized Representative            Date                   Time    __________________________________________ Nurse's Signature     

## 2011-11-19 ENCOUNTER — Telehealth: Payer: Self-pay | Admitting: *Deleted

## 2011-11-19 NOTE — Telephone Encounter (Signed)
Per staff message and POF I have scheduled appts.  JMW  

## 2011-11-21 ENCOUNTER — Other Ambulatory Visit: Payer: Self-pay | Admitting: *Deleted

## 2011-11-21 MED ORDER — SIMVASTATIN 40 MG PO TABS: 40.0000 mg | ORAL_TABLET | Freq: Every day | ORAL | Status: DC

## 2011-11-24 ENCOUNTER — Other Ambulatory Visit: Payer: Self-pay | Admitting: Family Medicine

## 2011-12-03 ENCOUNTER — Ambulatory Visit (HOSPITAL_BASED_OUTPATIENT_CLINIC_OR_DEPARTMENT_OTHER): Payer: Medicare Other

## 2011-12-03 ENCOUNTER — Encounter: Payer: Self-pay | Admitting: Physician Assistant

## 2011-12-03 ENCOUNTER — Other Ambulatory Visit: Payer: Medicare Other

## 2011-12-03 ENCOUNTER — Ambulatory Visit (HOSPITAL_BASED_OUTPATIENT_CLINIC_OR_DEPARTMENT_OTHER): Payer: Medicare Other | Admitting: Physician Assistant

## 2011-12-03 ENCOUNTER — Telehealth: Payer: Self-pay | Admitting: Internal Medicine

## 2011-12-03 ENCOUNTER — Other Ambulatory Visit (HOSPITAL_BASED_OUTPATIENT_CLINIC_OR_DEPARTMENT_OTHER): Payer: Medicare Other | Admitting: Lab

## 2011-12-03 VITALS — BP 120/75 | HR 82 | Temp 97.7°F | Resp 20 | Ht 64.0 in | Wt 155.6 lb

## 2011-12-03 DIAGNOSIS — C3410 Malignant neoplasm of upper lobe, unspecified bronchus or lung: Secondary | ICD-10-CM

## 2011-12-03 DIAGNOSIS — C779 Secondary and unspecified malignant neoplasm of lymph node, unspecified: Secondary | ICD-10-CM

## 2011-12-03 DIAGNOSIS — C3490 Malignant neoplasm of unspecified part of unspecified bronchus or lung: Secondary | ICD-10-CM

## 2011-12-03 DIAGNOSIS — C7949 Secondary malignant neoplasm of other parts of nervous system: Secondary | ICD-10-CM

## 2011-12-03 DIAGNOSIS — C50919 Malignant neoplasm of unspecified site of unspecified female breast: Secondary | ICD-10-CM | POA: Diagnosis not present

## 2011-12-03 DIAGNOSIS — Z5111 Encounter for antineoplastic chemotherapy: Secondary | ICD-10-CM

## 2011-12-03 DIAGNOSIS — C7952 Secondary malignant neoplasm of bone marrow: Secondary | ICD-10-CM

## 2011-12-03 DIAGNOSIS — R112 Nausea with vomiting, unspecified: Secondary | ICD-10-CM

## 2011-12-03 DIAGNOSIS — C7951 Secondary malignant neoplasm of bone: Secondary | ICD-10-CM | POA: Diagnosis not present

## 2011-12-03 DIAGNOSIS — C7931 Secondary malignant neoplasm of brain: Secondary | ICD-10-CM | POA: Diagnosis not present

## 2011-12-03 LAB — COMPREHENSIVE METABOLIC PANEL (CC13)
ALT: 9 U/L (ref 0–55)
CO2: 29 mEq/L (ref 22–29)
Calcium: 9.4 mg/dL (ref 8.4–10.4)
Chloride: 106 mEq/L (ref 98–107)
Creatinine: 0.8 mg/dL (ref 0.6–1.1)
Glucose: 96 mg/dl (ref 70–99)
Sodium: 141 mEq/L (ref 136–145)
Total Bilirubin: 0.33 mg/dL (ref 0.20–1.20)
Total Protein: 6.6 g/dL (ref 6.4–8.3)

## 2011-12-03 LAB — CBC WITH DIFFERENTIAL/PLATELET
BASO%: 0.6 % (ref 0.0–2.0)
Eosinophils Absolute: 0.1 10*3/uL (ref 0.0–0.5)
HCT: 35.3 % (ref 34.8–46.6)
LYMPH%: 25.2 % (ref 14.0–49.7)
MONO#: 1 10*3/uL — ABNORMAL HIGH (ref 0.1–0.9)
NEUT#: 3.9 10*3/uL (ref 1.5–6.5)
NEUT%: 58.1 % (ref 38.4–76.8)
Platelets: 391 10*3/uL (ref 145–400)
WBC: 6.7 10*3/uL (ref 3.9–10.3)
lymph#: 1.7 10*3/uL (ref 0.9–3.3)

## 2011-12-03 MED ORDER — MORPHINE SULFATE 30 MG PO TABS: 30.0000 mg | ORAL_TABLET | ORAL | Status: DC | PRN

## 2011-12-03 MED ORDER — HEPARIN SOD (PORK) LOCK FLUSH 100 UNIT/ML IV SOLN
500.0000 [IU] | Freq: Once | INTRAVENOUS | Status: AC | PRN
  Administered 2011-12-03: 500 [IU]
  Filled 2011-12-03: qty 5

## 2011-12-03 MED ORDER — ALPRAZOLAM 0.25 MG PO TABS: 0.2500 mg | ORAL_TABLET | Freq: Every evening | ORAL | Status: DC | PRN

## 2011-12-03 MED ORDER — SODIUM CHLORIDE 0.9 % IV SOLN
500.0000 mg/m2 | Freq: Once | INTRAVENOUS | Status: AC
  Administered 2011-12-03: 900 mg via INTRAVENOUS
  Filled 2011-12-03: qty 36

## 2011-12-03 MED ORDER — ONDANSETRON 8 MG/50ML IVPB (CHCC)
8.0000 mg | Freq: Once | INTRAVENOUS | Status: AC
  Administered 2011-12-03: 8 mg via INTRAVENOUS

## 2011-12-03 MED ORDER — SODIUM CHLORIDE 0.9 % IV SOLN
Freq: Once | INTRAVENOUS | Status: AC
  Administered 2011-12-03: 12:00:00 via INTRAVENOUS

## 2011-12-03 MED ORDER — MORPHINE SULFATE ER 60 MG PO TBCR: 60.0000 mg | EXTENDED_RELEASE_TABLET | Freq: Two times a day (BID) | ORAL | Status: DC

## 2011-12-03 MED ORDER — DEXAMETHASONE SODIUM PHOSPHATE 10 MG/ML IJ SOLN
10.0000 mg | Freq: Once | INTRAMUSCULAR | Status: AC
  Administered 2011-12-03: 10 mg via INTRAVENOUS

## 2011-12-03 MED ORDER — ONDANSETRON HCL 8 MG PO TABS: 8.0000 mg | ORAL_TABLET | Freq: Three times a day (TID) | ORAL | Status: DC | PRN

## 2011-12-03 MED ORDER — SODIUM CHLORIDE 0.9 % IJ SOLN
10.0000 mL | INTRAMUSCULAR | Status: DC | PRN
  Administered 2011-12-03: 10 mL
  Filled 2011-12-03: qty 10

## 2011-12-03 NOTE — Patient Instructions (Signed)
[facility] Discharge Instructions for Patients Receiving Chemotherapy  Today you received the following chemotherapy agent Alimta.  To help prevent nausea and vomiting after your treatment, we encourage you to take your nausea medication. Begin taking your nausea medication  as often as prescribed for by Dr. Mohamed.    If you develop nausea and vomiting that is not controlled by your nausea medication, call the clinic. If it is after clinic hours your family physician or the after hours number for the clinic or go to the Emergency Department.   BELOW ARE SYMPTOMS THAT SHOULD BE REPORTED IMMEDIATELY:  *FEVER GREATER THAN 100.5 F  *CHILLS WITH OR WITHOUT FEVER  NAUSEA AND VOMITING THAT IS NOT CONTROLLED WITH YOUR NAUSEA MEDICATION  *UNUSUAL SHORTNESS OF BREATH  *UNUSUAL BRUISING OR BLEEDING  TENDERNESS IN MOUTH AND THROAT WITH OR WITHOUT PRESENCE OF ULCERS  *URINARY PROBLEMS  *BOWEL PROBLEMS  UNUSUAL RASH Items with * indicate a potential emergency and should be followed up as soon as possible.  One of the nurses will contact you 24 hours after your treatment. Please let the nurse know about any problems that you may have experienced. Feel free to call the clinic you have any questions or concerns. The clinic phone number is [phone].   I have been informed and understand all the instructions given to me. I know to contact the clinic, my physician, or go to the Emergency Department if any problems should occur. I do not have any questions at this time, but understand that I may call the clinic during office hours   should I have any questions or need assistance in obtaining follow up care.    __________________________________________  _____________  __________ Signature of Patient or Authorized Representative            Date                   Time    __________________________________________ Nurse's Signature    

## 2011-12-03 NOTE — Telephone Encounter (Signed)
appts made and printed for pt aom °

## 2011-12-03 NOTE — Patient Instructions (Addendum)
Followup in one month prior to your next cycle of maintenance chemotherapy

## 2011-12-07 NOTE — Progress Notes (Signed)
[facility] [phone]   Fax:([phone]  OFFICE PROGRESS NOTE  [name], MD [address] [name]. [place] [address]  DIAGNOSIS: Metastatic non-small cell lung cancer initially diagnosed with locally advanced stage IIIB with right Pancoast tumor involving the vertebral body as well as the foraminal canal invasion with spinal cord compression in October of 2007.   PRIOR THERAPY:  1. Status post concurrent chemoradiation with weekly carboplatin and paclitaxel, last dose was given November 18, 2005. 2. 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 3. Status post gamma knife radiotherapy to a solitary brain lesion located in the superior frontal area of the brain at [facility] in April of 2008. 4. Status post palliative radiotherapy to the lateral abdominal wall metastatic lesion under the care of Dr. Mitzi Hansen, completed March of 2009. 5. Status post 6 cycles of systemic chemotherapy with carboplatin and Alimta. Last dose was given July 26, 2007 with disease stabilization.  CURRENT THERAPY: : Maintenance chemotherapy with Alimta 500 mg per meter squared given every 3 weeks. The patient is status post 73 cycles.  INTERVAL HISTORY: Danielle [name] 61 y.o. female returns to the clinic today for followup visit. The patient is feeling fine today with no specific complaints. She denied having any significant nausea or vomiting, no fever or chills. She denied having any significant weight loss or night sweats. She has no chest pain, shortness breath, cough or hemoptysis. She is tolerating her treatment with maintenance Alimta fairly well. She request a prescription refills for her MS Contin, MSIR, Xanax, and Zofran.  MEDICAL HISTORY: Past Medical History  Diagnosis Date  . Hypertension   . Anemia   . H/O: pneumonia   . History of tobacco abuse quit 10/08    on nicotine patch  . Fibromyalgia   . Hypokalemia   . DJD (degenerative joint disease), cervical   . Thrush 12/11/2010  . 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 had chemo and radiation for lung ca    ALLERGIES:  is allergic to contrast media; co-trimoxazole injection; iohexol; and sulfa drugs cross reactors.  MEDICATIONS:  Current Outpatient Prescriptions  Medication Sig Dispense Refill  . ALPRAZolam (XANAX) 0.25 MG tablet Take 1 tablet (0.25 mg total) by mouth at bedtime as needed.  30 tablet  0  . amLODipine (NORVASC) 10 MG tablet Take 1 tablet (10 mg total) by mouth daily.  90 tablet  3  . folic acid (FOLVITE) 1 MG tablet Take 1 tablet (1 mg total) by mouth daily.  90 tablet  3  . lidocaine-prilocaine (EMLA) cream Apply topically as needed.  30 g  0  . lisinopril (PRINIVIL,ZESTRIL) 5 MG tablet TAKE 1 TABLET BY MOUTH EVERY DAY  90 tablet  0  . morphine (MS CONTIN) 60 MG 12 hr tablet Take 1 tablet (60 mg total) by mouth 2 (two) times daily.  60 tablet  0  . morphine (MSIR) 30 MG tablet Take 1 tablet (30 mg total) by mouth every 4 (four) hours as needed. Take 1 tab every 4-6 hours as needed for breakthrough pain  60 tablet  0  . ondansetron (ZOFRAN) 8 MG tablet Take 1 tablet (8 mg total) by mouth every 8 (eight) hours as needed for nausea.  30 tablet  2  . PATANOL 0.1 % ophthalmic solution PUT 1 DROP IN AFFECTED EYE(S)  2X A DAY  5 mL  1  . predniSONE (DELTASONE) 50 MG tablet 13 hrs, 7 hrs, and 1 hr before scheduled CT scans      . simvastatin (ZOCOR) 40 MG tablet Take 1 tablet (40 mg total) by mouth daily.  90 tablet  1    SURGICAL HISTORY:  Past Surgical History  Procedure Date  . Cervical laminectomy 1995  . Other surgical history 2008    Gamma knife surgery to remove brain met   . 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 Knee surgery 1990    Left x 2  . Portacath placement -[name]    TIP IN CAVOATRIAL JUNCTION     REVIEW OF SYSTEMS:  A comprehensive review of systems was negative.   PHYSICAL EXAMINATION: General appearance: alert, cooperative and no distress Head: Normocephalic, without obvious abnormality, atraumatic Neck: no adenopathy Lymph nodes: Cervical, supraclavicular, and axillary nodes normal. Resp: clear to auscultation bilaterally Cardio: regular rate and rhythm, S1, S2 normal, no murmur, click, rub or gallop GI: soft, non-tender; bowel sounds normal; no masses,  no organomegaly Extremities: extremities normal, atraumatic, no cyanosis or edema Neurologic: Alert and oriented X 3, normal strength and tone. Normal symmetric reflexes. Normal coordination and gait  ECOG PERFORMANCE STATUS: 0 - Asymptomatic  Blood pressure 120/75, pulse 82, temperature 97.7 F (36.5 C), temperature source Oral, resp. rate 20, height 5\' 4"  (1.626 m), weight 155 lb 9.6 oz (70.58 kg).  LABORATORY DATA: Lab Results  Component Value Date   WBC 6.7 12/03/2011   HGB 12.0 12/03/2011   HCT 35.3 12/03/2011   MCV 88.9 12/03/2011   PLT 391 12/03/2011      Chemistry      Component Value Date/Time   NA 141 12/03/2011 1031   NA 135 08/20/2011 0953   K 4.4 12/03/2011 1031   K 4.4 08/20/2011 0953   CL 106 12/03/2011 1031   CL 105 08/20/2011 0953   CO2 29 12/03/2011 1031   CO2 28 08/20/2011 0953   BUN 11.0 12/03/2011 1031   BUN 13 08/20/2011 0953   CREATININE 0.8 12/03/2011 1031   CREATININE 0.80 08/20/2011 0953      Component Value Date/Time   CALCIUM 9.4 12/03/2011 1031   CALCIUM 9.4 08/20/2011 0953   ALKPHOS 79 12/03/2011 1031   ALKPHOS 73 08/20/2011 0953   AST 13 12/03/2011 1031   AST 16 08/20/2011 0953   ALT 9 12/03/2011 1031   ALT 9 08/20/2011 0953   BILITOT 0.33 12/03/2011 1031   BILITOT 0.5 08/20/2011 0953       RADIOGRAPHIC STUDIES: Ct Chest W Contrast  11/08/2011  *RADIOLOGY REPORT*  Clinical Data:  Lung cancer with metastasis to the brain, spine, and omentum with radiation therapy to groin  completed in 2009. Gamma knife to brain in 2008.  Ongoing chemotherapy.  History of spontaneous right-sided pneumothorax.  CT CHEST, ABDOMEN AND PELVIS WITH CONTRAST  Technique: Contiguous axial images of the chest abdomen and pelvis were obtained after IV contrast administration.  Contrast: 100  ml Omnipaque-300  Comparison: 09/06/2011   CT CHEST  Findings: Lung windows demonstrate moderate centrilobular emphysema.  Right apical radiation fibrosis, similar in appearance to on the prior exam.  An area of presumed pleural based scarring within the anterior right upper lobe is unchanged at 7 mm on image 16.  Posterior pleural based soft tissue density in the right apex measures 2.2 x 1.1 cm today versus  2.0 x 1.4 cm on the prior.  Soft tissue windows demonstrate no supraclavicular adenopathy.  A right-sided Port-A-Cath which terminates at the mid right atrium.  Advanced aortic atherosclerosis. Normal heart size without pericardial or pleural effusion.  LAD coronary artery atherosclerosis on image 39; age advanced.  No central pulmonary embolism, on this non-dedicated study.  No mediastinal or hilar adenopathy.  IMPRESSION:  1.  Radiation change in the right upper lobe/apex without evidence of recurrent or metastatic disease. 2. Age advanced coronary artery atherosclerosis.  Recommend assessment of coronary risk factors and consideration of medical therapy. 3.  Moderate centrilobular emphysema. 4.  Similar appearance of vertebral augmentation at T7 and T4 with a T3 severe compression deformity.   CT ABDOMEN AND PELVIS  Findings:  Normal liver, spleen, stomach, pancreas, gallbladder, biliary tract, adrenal glands, right kidney.  Too small to characterize lower pole left renal lesion which is likely a cyst. There is subtle enhancement of the uroepithelium of the proximal ureters, including on images 72 and 74.  This is similar to on the prior exam.  No retroperitoneal or retrocrural adenopathy.  Moderate amount of  stool within the rectum and sigmoid.  The descending colon is underdistended.  Apparent wall thickening is favored to be secondary and artifactual on image 83.  No surrounding inflammation.  Normal terminal ileum and appendix.  Normal small bowel without abdominal ascites.    No pelvic adenopathy.    No evidence of omental or peritoneal disease.  Retroverted uterus.  Normal urinary bladder. No adnexal mass or significant free fluid.  Minimal subchondral sclerosis within the femoral heads bilaterally.  This is unchanged.  Example on the left on image 109 and on the right on image 112.  Status post vertebral augmentation at T7 and T4, with severe compression deformity at T3.  Focal kyphosis about the T3 level.  Appearance is overall similar.  IMPRESSION:  1. No acute process or evidence of metastatic disease in the abdomen or pelvis. 2.  Apparent descending colonic wall thickening could be due to underdistension.  Correlate with any symptoms to suggest colitis. 3. Possible constipation. 4.  Uroepithelial enhancement is subtle and nonspecific.  This has been present on prior exams.  Correlate with any symptoms to suggest ascending urinary tract infection. 5. 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   Original Report Authenticated By: Jeronimo Greaves, M.D.    ASSESSMENT/PLAN: This is a very pleasant 61 years old white female with metastatic non-small cell lung cancer, adenocarcinoma currently on treatment with maintenance Alimta status post 73 cycles. The patient is tolerating her treatment fairly well. She has no evidence for disease progression on his recent scan of the chest, abdomen and pelvis. Her [facility] of the brain performed at [facility] recently showed also no evidence for disease progression in the brain. Patient was discussed with Dr. Arbutus Ped. She'll proceed with cycle #74 for maintenance chemotherapy with single agent Alimta. She was given prescription refills for her MS Contin, MSIR,  Xanax and Zofran all at the current doses. She'll followup in 3 weeks with a repeat CBC differential and C. met prior to her next scheduled cycle of maintenance Alimta.  Danielle [name], Danielle Colaizzi E, PA-C   All questions were answered. The patient knows to call the clinic with any problems, questions or concerns. We can certainly see the patient much sooner if necessary.  I spent 15 minutes counseling the patient face to face. The total time spent in the appointment was 25 minutes.

## 2011-12-24 ENCOUNTER — Ambulatory Visit (HOSPITAL_BASED_OUTPATIENT_CLINIC_OR_DEPARTMENT_OTHER): Payer: Medicare Other | Admitting: Physician Assistant

## 2011-12-24 ENCOUNTER — Other Ambulatory Visit: Payer: Medicare Other

## 2011-12-24 ENCOUNTER — Other Ambulatory Visit: Payer: Medicare Other | Admitting: Lab

## 2011-12-24 ENCOUNTER — Other Ambulatory Visit (HOSPITAL_BASED_OUTPATIENT_CLINIC_OR_DEPARTMENT_OTHER): Payer: Medicare Other | Admitting: Lab

## 2011-12-24 ENCOUNTER — Encounter: Payer: Self-pay | Admitting: Physician Assistant

## 2011-12-24 ENCOUNTER — Telehealth: Payer: Self-pay | Admitting: Internal Medicine

## 2011-12-24 ENCOUNTER — Telehealth: Payer: Self-pay | Admitting: *Deleted

## 2011-12-24 ENCOUNTER — Ambulatory Visit (HOSPITAL_BASED_OUTPATIENT_CLINIC_OR_DEPARTMENT_OTHER): Payer: Medicare Other

## 2011-12-24 VITALS — BP 124/60 | HR 90 | Temp 98.0°F | Resp 18 | Ht 64.0 in | Wt 154.3 lb

## 2011-12-24 DIAGNOSIS — C3410 Malignant neoplasm of upper lobe, unspecified bronchus or lung: Secondary | ICD-10-CM

## 2011-12-24 DIAGNOSIS — C7949 Secondary malignant neoplasm of other parts of nervous system: Secondary | ICD-10-CM

## 2011-12-24 DIAGNOSIS — C7931 Secondary malignant neoplasm of brain: Secondary | ICD-10-CM | POA: Diagnosis not present

## 2011-12-24 DIAGNOSIS — C3490 Malignant neoplasm of unspecified part of unspecified bronchus or lung: Secondary | ICD-10-CM

## 2011-12-24 DIAGNOSIS — Z5111 Encounter for antineoplastic chemotherapy: Secondary | ICD-10-CM

## 2011-12-24 LAB — COMPREHENSIVE METABOLIC PANEL (CC13)
ALT: 10 U/L (ref 0–55)
AST: 14 U/L (ref 5–34)
Alkaline Phosphatase: 81 U/L (ref 40–150)
Creatinine: 0.7 mg/dL (ref 0.6–1.1)
Sodium: 141 mEq/L (ref 136–145)
Total Bilirubin: 0.48 mg/dL (ref 0.20–1.20)
Total Protein: 6.3 g/dL — ABNORMAL LOW (ref 6.4–8.3)

## 2011-12-24 LAB — CBC WITH DIFFERENTIAL/PLATELET
BASO%: 0.1 % (ref 0.0–2.0)
EOS%: 0.7 % (ref 0.0–7.0)
HCT: 36.6 % (ref 34.8–46.6)
LYMPH%: 20.5 % (ref 14.0–49.7)
MCH: 29.4 pg (ref 25.1–34.0)
MCHC: 32.8 g/dL (ref 31.5–36.0)
MONO%: 12.6 % (ref 0.0–14.0)
NEUT%: 66.1 % (ref 38.4–76.8)
Platelets: 320 10*3/uL (ref 145–400)
RBC: 4.08 10*6/uL (ref 3.70–5.45)
WBC: 7.5 10*3/uL (ref 3.9–10.3)

## 2011-12-24 MED ORDER — SODIUM CHLORIDE 0.9 % IJ SOLN
10.0000 mL | INTRAMUSCULAR | Status: DC | PRN
  Administered 2011-12-24: 10 mL
  Filled 2011-12-24: qty 10

## 2011-12-24 MED ORDER — HEPARIN SOD (PORK) LOCK FLUSH 100 UNIT/ML IV SOLN
500.0000 [IU] | Freq: Once | INTRAVENOUS | Status: AC | PRN
  Administered 2011-12-24: 500 [IU]
  Filled 2011-12-24: qty 5

## 2011-12-24 MED ORDER — CYANOCOBALAMIN 1000 MCG/ML IJ SOLN
1000.0000 ug | Freq: Once | INTRAMUSCULAR | Status: AC
  Administered 2011-12-24: 1000 ug via INTRAMUSCULAR

## 2011-12-24 MED ORDER — SODIUM CHLORIDE 0.9 % IV SOLN
Freq: Once | INTRAVENOUS | Status: AC
  Administered 2011-12-24: 10:00:00 via INTRAVENOUS

## 2011-12-24 MED ORDER — DEXAMETHASONE SODIUM PHOSPHATE 10 MG/ML IJ SOLN
10.0000 mg | Freq: Once | INTRAMUSCULAR | Status: AC
  Administered 2011-12-24: 10 mg via INTRAVENOUS

## 2011-12-24 MED ORDER — SODIUM CHLORIDE 0.9 % IV SOLN
500.0000 mg/m2 | Freq: Once | INTRAVENOUS | Status: AC
  Administered 2011-12-24: 900 mg via INTRAVENOUS
  Filled 2011-12-24: qty 36

## 2011-12-24 MED ORDER — ONDANSETRON 8 MG/50ML IVPB (CHCC)
8.0000 mg | Freq: Once | INTRAVENOUS | Status: AC
  Administered 2011-12-24: 8 mg via INTRAVENOUS

## 2011-12-24 NOTE — Telephone Encounter (Signed)
Per staff message and POF I have scheduled appts. I have to move lab, scheduler advised. JMW

## 2011-12-24 NOTE — Patient Instructions (Addendum)
[facility] Discharge Instructions for Patients Receiving Chemotherapy  Today you received the following chemotherapy agents:  Alimta  To help prevent nausea and vomiting after your treatment, we encourage you to take your nausea medication as ordered per MD.    If you develop nausea and vomiting that is not controlled by your nausea medication, call the clinic. If it is after clinic hours your family physician or the after hours number for the clinic or go to the Emergency Department.   BELOW ARE SYMPTOMS THAT SHOULD BE REPORTED IMMEDIATELY:  *FEVER GREATER THAN 100.5 F  *CHILLS WITH OR WITHOUT FEVER  NAUSEA AND VOMITING THAT IS NOT CONTROLLED WITH YOUR NAUSEA MEDICATION  *UNUSUAL SHORTNESS OF BREATH  *UNUSUAL BRUISING OR BLEEDING  TENDERNESS IN MOUTH AND THROAT WITH OR WITHOUT PRESENCE OF ULCERS  *URINARY PROBLEMS  *BOWEL PROBLEMS  UNUSUAL RASH Items with * indicate a potential emergency and should be followed up as soon as possible.   Please let the nurse know about any problems that you may have experienced. Feel free to call the clinic you have any questions or concerns. The clinic phone number is [phone].   I have been informed and understand all the instructions given to me. I know to contact the clinic, my physician, or go to the Emergency Department if any problems should occur. I do not have any questions at this time, but understand that I may call the clinic during office hours   should I have any questions or need assistance in obtaining follow up care.    __________________________________________  _____________  __________ Signature of Patient or Authorized Representative            Date                   Time    __________________________________________ Nurse's Signature    

## 2011-12-24 NOTE — Progress Notes (Signed)
[facility] Telephone:(336) [phone]   Fax:(336) [phone]  OFFICE PROGRESS NOTE  [name], MD [address] [name]. [place] [address]  DIAGNOSIS: Metastatic non-small cell lung cancer initially diagnosed with locally advanced stage IIIB with right Pancoast tumor involving the vertebral body as well as the foraminal canal invasion with spinal cord compression in October of 2007.   PRIOR THERAPY:  1. Status post concurrent chemoradiation with weekly carboplatin and paclitaxel, last dose was given November 18, 2005. 2. 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 3. Status post gamma knife radiotherapy to a solitary brain lesion located in the superior frontal area of the brain at [facility] in April of 2008. 4. Status post palliative radiotherapy to the lateral abdominal wall metastatic lesion under the care of Dr. Mitzi Hansen, completed March of 2009. 5. Status post 6 cycles of systemic chemotherapy with carboplatin and Alimta. Last dose was given July 26, 2007 with disease stabilization.  CURRENT THERAPY: : Maintenance chemotherapy with Alimta 500 mg per meter squared given every 3 weeks. The patient is status post 74 cycles.  INTERVAL HISTORY: Danielle [name] 61 y.o. female returns to the clinic today for followup visit. The patient is feeling fine today with no specific complaints. She denied having any significant nausea or vomiting, no fever or chills. She denied having any significant weight loss or night sweats. She does report a decrease in her appetite but states that she is trying to lose weight. She has no chest pain, shortness breath, cough or hemoptysis. She is tolerating her treatment with maintenance Alimta fairly well. She will be due for some medication refills towards the end of next week.  MEDICAL HISTORY: Past Medical History  Diagnosis Date  .  Hypertension   . Anemia   . H/O: pneumonia   . History of tobacco abuse quit 10/08    on nicotine patch  . Fibromyalgia   . Hypokalemia   . DJD (degenerative joint disease), cervical   . Thrush 12/11/2010  . 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 had chemo and radiation for lung ca    ALLERGIES:  is allergic to contrast media; co-trimoxazole injection; iohexol; and sulfa drugs cross reactors.  MEDICATIONS:  Current Outpatient Prescriptions  Medication Sig Dispense Refill  . ALPRAZolam (XANAX) 0.25 MG tablet Take 1 tablet (0.25 mg total) by mouth at bedtime as needed.  30 tablet  0  . amLODipine (NORVASC) 10 MG tablet Take 1 tablet (10 mg total) by mouth daily.  90 tablet  3  . folic acid (FOLVITE) 1 MG tablet Take 1 tablet (1 mg total) by mouth daily.  90 tablet  3  . lidocaine-prilocaine (EMLA) cream Apply topically as needed.  30 g  0  . lisinopril (PRINIVIL,ZESTRIL) 5 MG tablet TAKE 1 TABLET BY MOUTH EVERY DAY  90 tablet  0  . morphine (MS CONTIN) 60 MG 12 hr tablet Take 1 tablet (60 mg total) by mouth 2 (two) times daily.  60 tablet  0  . morphine (MSIR) 30 MG tablet Take 1 tablet (30 mg total) by mouth every 4 (four) hours as needed. Take 1 tab every 4-6 hours as needed for breakthrough pain  60 tablet  0  . ondansetron (ZOFRAN) 8 MG tablet Take 1 tablet (8 mg total) by mouth every 8 (eight) hours as needed for  nausea.  30 tablet  2  . PATANOL 0.1 % ophthalmic solution PUT 1 DROP IN AFFECTED EYE(S) 2X A DAY  5 mL  1  . predniSONE (DELTASONE) 50 MG tablet 13 hrs, 7 hrs, and 1 hr before scheduled CT scans      . simvastatin (ZOCOR) 40 MG tablet Take 1 tablet (40 mg total) by mouth daily.  90 tablet  1    SURGICAL HISTORY:  Past Surgical History  Procedure Date  . Cervical laminectomy 1995  . Other surgical history 2008    Gamma knife surgery to remove brain met   . 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 Knee surgery  1990    Left x 2  . Portacath placement -[name]    TIP IN CAVOATRIAL JUNCTION    REVIEW OF SYSTEMS:  A comprehensive review of systems was negative.   PHYSICAL EXAMINATION: General appearance: alert, cooperative and no distress Head: Normocephalic, without obvious abnormality, atraumatic Neck: no adenopathy Lymph nodes: Cervical, supraclavicular, and axillary nodes normal. Resp: clear to auscultation bilaterally Cardio: regular rate and rhythm, S1, S2 normal, no murmur, click, rub or gallop GI: soft, non-tender; bowel sounds normal; no masses,  no organomegaly Extremities: extremities normal, atraumatic, no cyanosis or edema Neurologic: Alert and oriented X 3, normal strength and tone. Normal symmetric reflexes. Normal coordination and gait  ECOG PERFORMANCE STATUS: 0 - Asymptomatic  Blood pressure 124/60, pulse 90, temperature 98 F (36.7 C), temperature source Oral, resp. rate 18, height 5\' 4"  (1.626 m), weight 154 lb 4.8 oz (69.99 kg).  LABORATORY DATA: Lab Results  Component Value Date   WBC 7.5 12/24/2011   HGB 12.0 12/24/2011   HCT 36.6 12/24/2011   MCV 89.7 12/24/2011   PLT 320 12/24/2011      Chemistry      Component Value Date/Time   NA 141 12/03/2011 1031   NA 135 08/20/2011 0953   K 4.4 12/03/2011 1031   K 4.4 08/20/2011 0953   CL 106 12/03/2011 1031   CL 105 08/20/2011 0953   CO2 29 12/03/2011 1031   CO2 28 08/20/2011 0953   BUN 11.0 12/03/2011 1031   BUN 13 08/20/2011 0953   CREATININE 0.8 12/03/2011 1031   CREATININE 0.80 08/20/2011 0953      Component Value Date/Time   CALCIUM 9.4 12/03/2011 1031   CALCIUM 9.4 08/20/2011 0953   ALKPHOS 79 12/03/2011 1031   ALKPHOS 73 08/20/2011 0953   AST 13 12/03/2011 1031   AST 16 08/20/2011 0953   ALT 9 12/03/2011 1031   ALT 9 08/20/2011 0953   BILITOT 0.33 12/03/2011 1031   BILITOT 0.5 08/20/2011 0953       RADIOGRAPHIC STUDIES: Ct Chest W Contrast  11/08/2011  *RADIOLOGY REPORT*  Clinical Data:  Lung  cancer with metastasis to the brain, spine, and omentum with radiation therapy to groin completed in 2009. Gamma knife to brain in 2008.  Ongoing chemotherapy.  History of spontaneous right-sided pneumothorax.  CT CHEST, ABDOMEN AND PELVIS WITH CONTRAST  Technique: Contiguous axial images of the chest abdomen and pelvis were obtained after IV contrast administration.  Contrast: 100  ml Omnipaque-300  Comparison: 09/06/2011   CT CHEST  Findings: Lung windows demonstrate moderate centrilobular emphysema.  Right apical radiation fibrosis, similar in appearance to on the prior exam.  An area of presumed pleural based scarring within the anterior right upper lobe is unchanged at 7 mm on image  16.  Posterior pleural based soft tissue density in the right apex measures 2.2 x 1.1 cm today versus 2.0 x 1.4 cm on the prior.  Soft tissue windows demonstrate no supraclavicular adenopathy.  A right-sided Port-A-Cath which terminates at the mid right atrium.  Advanced aortic atherosclerosis. Normal heart size without pericardial or pleural effusion.  LAD coronary artery atherosclerosis on image 26; age advanced.  No central pulmonary embolism, on this non-dedicated study.  No mediastinal or hilar adenopathy.  IMPRESSION:  1.  Radiation change in the right upper lobe/apex without evidence of recurrent or metastatic disease. 2. Age advanced coronary artery atherosclerosis.  Recommend assessment of coronary risk factors and consideration of medical therapy. 3.  Moderate centrilobular emphysema. 4.  Similar appearance of vertebral augmentation at T7 and T4 with a T3 severe compression deformity.   CT ABDOMEN AND PELVIS  Findings:  Normal liver, spleen, stomach, pancreas, gallbladder, biliary tract, adrenal glands, right kidney.  Too small to characterize lower pole left renal lesion which is likely a cyst. There is subtle enhancement of the uroepithelium of the proximal ureters, including on images 72 and 74.  This is similar to on  the prior exam.  No retroperitoneal or retrocrural adenopathy.  Moderate amount of stool within the rectum and sigmoid.  The descending colon is underdistended.  Apparent wall thickening is favored to be secondary and artifactual on image 83.  No surrounding inflammation.  Normal terminal ileum and appendix.  Normal small bowel without abdominal ascites.    No pelvic adenopathy.    No evidence of omental or peritoneal disease.  Retroverted uterus.  Normal urinary bladder. No adnexal mass or significant free fluid.  Minimal subchondral sclerosis within the femoral heads bilaterally.  This is unchanged.  Example on the left on image 109 and on the right on image 112.  Status post vertebral augmentation at T7 and T4, with severe compression deformity at T3.  Focal kyphosis about the T3 level.  Appearance is overall similar.  IMPRESSION:  1. No acute process or evidence of metastatic disease in the abdomen or pelvis. 2.  Apparent descending colonic wall thickening could be due to underdistension.  Correlate with any symptoms to suggest colitis. 3. Possible constipation. 4.  Uroepithelial enhancement is subtle and nonspecific.  This has been present on prior exams.  Correlate with any symptoms to suggest ascending urinary tract infection. 5. 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   Original Report Authenticated By: Jeronimo Greaves, M.D.    ASSESSMENT/PLAN: This is a very pleasant 61 years old white female with metastatic non-small cell lung cancer, adenocarcinoma currently on treatment with maintenance Alimta status post 73 cycles. The patient is tolerating her treatment fairly well. She has no evidence for disease progression on his recent scan of the chest, abdomen and pelvis. Her MRI of the brain performed at [facility] recently showed also no evidence for disease progression in the brain. Patient was discussed with Dr. Arbutus Ped. She'll proceed with cycle #75 for maintenance chemotherapy  with single agent Alimta. She'll followup with Dr. Arbutus Ped in 3 weeks with repeat CBC differential, C. met and CT of the chest abdomen and pelvis with contrast to reevaluate her disease. The patient is well aware to take her prednisone and Benadryl as premedication prior to her CT scan.  Danielle [name], Danielle Fornes E, PA-C   All questions were answered. The patient knows to call the clinic with any problems, questions or concerns. We can certainly see the patient much  sooner if necessary.  I spent 20 minutes counseling the patient face to face. The total time spent in the appointment was 30 minutes.

## 2011-12-24 NOTE — Telephone Encounter (Signed)
gv and printed appt schedule for pt for Jan 2014....emailed Danielle [name] to add chemo..the patient aware...gv pt appt schedule

## 2011-12-24 NOTE — Patient Instructions (Addendum)
Follow up in 3 weeks with Dr. Arbutus Ped with a restaging CT scan of your chest, abdomen and pelvis to re-evaluate your disease

## 2011-12-31 ENCOUNTER — Other Ambulatory Visit: Payer: Self-pay | Admitting: Physician Assistant

## 2011-12-31 DIAGNOSIS — C3490 Malignant neoplasm of unspecified part of unspecified bronchus or lung: Secondary | ICD-10-CM

## 2011-12-31 MED ORDER — MORPHINE SULFATE 30 MG PO TABS: 30.0000 mg | ORAL_TABLET | ORAL | Status: DC | PRN

## 2011-12-31 MED ORDER — ALPRAZOLAM 0.25 MG PO TABS: 0.2500 mg | ORAL_TABLET | Freq: Every evening | ORAL | Status: DC | PRN

## 2011-12-31 MED ORDER — MORPHINE SULFATE ER 60 MG PO TBCR: 60.0000 mg | EXTENDED_RELEASE_TABLET | Freq: Two times a day (BID) | ORAL | Status: DC

## 2012-01-09 ENCOUNTER — Other Ambulatory Visit: Payer: Self-pay | Admitting: Physician Assistant

## 2012-01-10 ENCOUNTER — Ambulatory Visit (HOSPITAL_COMMUNITY)
Admission: RE | Admit: 2012-01-10 | Discharge: 2012-01-10 | Disposition: A | Discharge status: Home / Self Care | Payer: Medicare Other | Source: Ambulatory Visit | Attending: Physician Assistant | Admitting: Physician Assistant

## 2012-01-10 ENCOUNTER — Telehealth: Payer: Self-pay | Admitting: Internal Medicine

## 2012-01-10 DIAGNOSIS — N289 Disorder of kidney and ureter, unspecified: Secondary | ICD-10-CM | POA: Diagnosis not present

## 2012-01-10 DIAGNOSIS — I2510 Atherosclerotic heart disease of native coronary artery without angina pectoris: Secondary | ICD-10-CM | POA: Diagnosis not present

## 2012-01-10 DIAGNOSIS — Z87311 Personal history of (healed) other pathological fracture: Secondary | ICD-10-CM | POA: Insufficient documentation

## 2012-01-10 DIAGNOSIS — C3490 Malignant neoplasm of unspecified part of unspecified bronchus or lung: Secondary | ICD-10-CM

## 2012-01-10 MED ORDER — IOHEXOL 300 MG/ML  SOLN
100.0000 mL | Freq: Once | INTRAMUSCULAR | Status: AC | PRN
  Administered 2012-01-10: 100 mL via INTRAVENOUS

## 2012-01-10 NOTE — Telephone Encounter (Signed)
PT AWARE THAT APPT ON 1/7 WILL ALSO BE WITH [facility]     Danielle [name]

## 2012-01-14 ENCOUNTER — Other Ambulatory Visit: Payer: Medicare Other

## 2012-01-14 ENCOUNTER — Ambulatory Visit (HOSPITAL_BASED_OUTPATIENT_CLINIC_OR_DEPARTMENT_OTHER): Payer: Medicare Other

## 2012-01-14 ENCOUNTER — Telehealth: Payer: Self-pay | Admitting: *Deleted

## 2012-01-14 ENCOUNTER — Encounter: Payer: Self-pay | Admitting: Family

## 2012-01-14 ENCOUNTER — Telehealth: Payer: Self-pay | Admitting: Internal Medicine

## 2012-01-14 ENCOUNTER — Ambulatory Visit (HOSPITAL_BASED_OUTPATIENT_CLINIC_OR_DEPARTMENT_OTHER): Payer: Medicare Other | Admitting: Family

## 2012-01-14 ENCOUNTER — Ambulatory Visit: Payer: Medicare Other | Admitting: Internal Medicine

## 2012-01-14 ENCOUNTER — Other Ambulatory Visit (HOSPITAL_BASED_OUTPATIENT_CLINIC_OR_DEPARTMENT_OTHER): Payer: Medicare Other | Admitting: Lab

## 2012-01-14 VITALS — BP 115/76 | HR 111 | Temp 97.7°F | Resp 18 | Ht 64.0 in | Wt 149.8 lb

## 2012-01-14 DIAGNOSIS — C3410 Malignant neoplasm of upper lobe, unspecified bronchus or lung: Secondary | ICD-10-CM

## 2012-01-14 DIAGNOSIS — C7949 Secondary malignant neoplasm of other parts of nervous system: Secondary | ICD-10-CM | POA: Diagnosis not present

## 2012-01-14 DIAGNOSIS — C3490 Malignant neoplasm of unspecified part of unspecified bronchus or lung: Secondary | ICD-10-CM

## 2012-01-14 DIAGNOSIS — C7931 Secondary malignant neoplasm of brain: Secondary | ICD-10-CM | POA: Diagnosis not present

## 2012-01-14 DIAGNOSIS — Z5111 Encounter for antineoplastic chemotherapy: Secondary | ICD-10-CM

## 2012-01-14 LAB — CBC WITH DIFFERENTIAL/PLATELET
BASO%: 0.4 % (ref 0.0–2.0)
EOS%: 0.4 % (ref 0.0–7.0)
HCT: 39.5 % (ref 34.8–46.6)
LYMPH%: 15.8 % (ref 14.0–49.7)
MCH: 29.4 pg (ref 25.1–34.0)
MCHC: 32.8 g/dL (ref 31.5–36.0)
MCV: 89.6 fL (ref 79.5–101.0)
MONO%: 9.1 % (ref 0.0–14.0)
NEUT%: 74.3 % (ref 38.4–76.8)
lymph#: 2.1 10*3/uL (ref 0.9–3.3)

## 2012-01-14 LAB — COMPREHENSIVE METABOLIC PANEL (CC13)
ALT: 11 U/L (ref 0–55)
AST: 17 U/L (ref 5–34)
Alkaline Phosphatase: 84 U/L (ref 40–150)
BUN: 12 mg/dL (ref 7.0–26.0)
Chloride: 109 mEq/L — ABNORMAL HIGH (ref 98–107)
Creatinine: 0.7 mg/dL (ref 0.6–1.1)
Total Bilirubin: 0.37 mg/dL (ref 0.20–1.20)

## 2012-01-14 MED ORDER — SODIUM CHLORIDE 0.9 % IV SOLN
500.0000 mg/m2 | Freq: Once | INTRAVENOUS | Status: AC
  Administered 2012-01-14: 900 mg via INTRAVENOUS
  Filled 2012-01-14: qty 36

## 2012-01-14 MED ORDER — ONDANSETRON 8 MG/50ML IVPB (CHCC)
8.0000 mg | Freq: Once | INTRAVENOUS | Status: AC
  Administered 2012-01-14: 8 mg via INTRAVENOUS

## 2012-01-14 MED ORDER — SODIUM CHLORIDE 0.9 % IV SOLN
Freq: Once | INTRAVENOUS | Status: AC
  Administered 2012-01-14: 11:00:00 via INTRAVENOUS

## 2012-01-14 MED ORDER — DEXAMETHASONE SODIUM PHOSPHATE 10 MG/ML IJ SOLN
10.0000 mg | Freq: Once | INTRAMUSCULAR | Status: AC
  Administered 2012-01-14: 10 mg via INTRAVENOUS

## 2012-01-14 MED ORDER — SODIUM CHLORIDE 0.9 % IJ SOLN
10.0000 mL | INTRAMUSCULAR | Status: DC | PRN
  Administered 2012-01-14: 10 mL
  Filled 2012-01-14: qty 10

## 2012-01-14 MED ORDER — HEPARIN SOD (PORK) LOCK FLUSH 100 UNIT/ML IV SOLN
500.0000 [IU] | Freq: Once | INTRAVENOUS | Status: AC | PRN
  Administered 2012-01-14: 500 [IU]
  Filled 2012-01-14: qty 5

## 2012-01-14 NOTE — Telephone Encounter (Signed)
Per staff message I have adjusted appt for 1/28.  JMW 

## 2012-01-14 NOTE — Progress Notes (Signed)
[facility]  [phone] [phone]  OFFICE PROGRESS NOTE  Patient ID: Danielle [name], female   DOB: 02/01/1950, [name] y.o.  AVW:[id number]      WGN:[id number]  CC:  Danielle Nora, MD  Diagnosis: Danielle [name] is a [name] y.o. Caucasian female with metastatic non-small cell lung cancer initially diagnosed with locally advanced stage IIIB with right Pancoast tumor involving the vertebral body as well as the foraminal canal invasion with spinal cord compression in October of 2007.   Prior Therapy: 1. Status post concurrent chemoradiation with weekly carboplatin and paclitaxel, last dose was given November 18, 2005. 2. 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 3. Status post gamma knife radiotherapy to a solitary brain lesion located in the superior frontal area of the brain at [facility] in April of 2008. 4. Status post palliative radiotherapy to the lateral abdominal wall metastatic lesion under the care of Dr. Mitzi Hansen, completed March of 2009. 5. Status post 6 cycles of systemic chemotherapy with carboplatin and Alimta. Last dose was given July 26, 2007 with disease stabilization.  Current Therapy: Maintenance chemotherapy with Alimta 500 mg per meter squared given every 3 weeks. The patient is status post 73 cycles   Interval History: Danielle [name] year-old Caucasian female returns to the clinic today for follow up visit.  She is accompanied by her sister Aram Beecham for today's office visit.  Her last office visit was on 12/24/2011.  Since that time, Danielle [name] states that she has been doing well with the exception of an isolated incidence of emesis (1 occurrence) and diarrhea that lasted 24 hours.  The emesis and diarrhea resolved spontaneously and happened one week after her last chemotherapy treatment.  She denies any other symptomatology including having any nausea,  fever,chills, any significant weight loss or night sweats. She has no chest pain, shortness breath, cough or hemoptysis. She is tolerating her treatment with maintenance Alimta fairly well.  Dr. Arbutus Ped spoke to both the patient and her sister regarding her recent CT of the abdomen/pelvis and chest CT on 01/10/2012.  Both CTs showed no findings to suggest metastatic disease or evidence to suggest disease recurrence.   Danielle [name] was a provided a copy of her scan reports.   Medical History: Past Medical History  Diagnosis Date  . Hypertension   . Anemia   . H/O: pneumonia   . History of tobacco abuse quit 10/08    on nicotine patch  . Fibromyalgia   . Hypokalemia   . DJD (degenerative joint disease), cervical   . Thrush 12/11/2010  . 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 had chemo and radiation for lung ca    Surgical History: Past Surgical History  Procedure Date  . Cervical laminectomy 1995  . Other surgical history 2008    Gamma knife surgery to remove brain met   . 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 Knee surgery 1990    Left x 2  . Portacath placement -[name]    TIP IN CAVOATRIAL JUNCTION    Family History: Family History  Problem Relation Age of Onset  . Diabetes Father   . Lymphoma Father   . Cancer Father   . Liver cancer Mother   . Lung cancer Mother   . Cancer Mother   . Hyperlipidemia Brother   .  Healthy Daughter   . Healthy Son   . Healthy Son   . Cancer Sister 46    ovarian cancer    Social History: History  Substance Use Topics  . Smoking status: Former Smoker -- 2.0 packs/day for 30 years    Quit date: 10/07/2005  . Smokeless tobacco: Never Used  . Alcohol Use: No    Allergies: Allergies  Allergen Reactions  . Contrast Media (Iodinated Diagnostic Agents) Hives  . Co-Trimoxazole Injection (Sulfamethoxazole-Trimethoprim)   . Iohexol      Code: HIVES, Desc: pt needs 13 hour  prep--prev hives; added allergy 06/02/07 slg   . Sulfa Drugs Cross Reactors     Current Medications: Current Outpatient Prescriptions  Medication Sig Dispense Refill  . ALPRAZolam (XANAX) 0.25 MG tablet Take 1 tablet (0.25 mg total) by mouth at bedtime as needed.  30 tablet  0  . amLODipine (NORVASC) 10 MG tablet Take 1 tablet (10 mg total) by mouth daily.  90 tablet  3  . folic acid (FOLVITE) 1 MG tablet Take 1 tablet (1 mg total) by mouth daily.  90 tablet  3  . lidocaine-prilocaine (EMLA) cream Apply topically as needed.  30 g  0  . lisinopril (PRINIVIL,ZESTRIL) 5 MG tablet TAKE 1 TABLET BY MOUTH EVERY DAY  90 tablet  0  . morphine (MS CONTIN) [name] MG 12 hr tablet Take 1 tablet ([name] mg total) by mouth 2 (two) times daily.  [name] tablet  0  . morphine (MSIR) 30 MG tablet Take 1 tablet (30 mg total) by mouth every 4 (four) hours as needed. Take 1 tab every 4-6 hours as needed for breakthrough pain  [name] tablet  0  . ondansetron (ZOFRAN) 8 MG tablet Take 1 tablet (8 mg total) by mouth every 8 (eight) hours as needed for nausea.  30 tablet  2  . PATANOL 0.1 % ophthalmic solution PUT 1 DROP IN AFFECTED EYE(S) 2X A DAY  5 mL  1  . predniSONE (DELTASONE) 50 MG tablet 13 hrs, 7 hrs, and 1 hr before scheduled CT scans      . simvastatin (ZOCOR) 40 MG tablet Take 1 tablet (40 mg total) by mouth daily.  90 tablet  1   No current facility-administered medications for this visit.   Facility-Administered Medications Ordered in Other Visits  Medication Dose Route Frequency Provider Last Rate Last Dose  . 0.9 %  sodium chloride infusion   Intravenous Once Si Gaul, MD      . dexamethasone (DECADRON) injection 10 mg  10 mg Intravenous Once Si Gaul, MD      . ondansetron (ZOFRAN) IVPB 8 mg  8 mg Intravenous Once Si Gaul, MD      . PEMEtrexed (ALIMTA) 900 mg in sodium chloride 0.9 % 100 mL chemo infusion  500 mg/m2 (Treatment Plan Actual) Intravenous Once Si Gaul, MD      . sodium  chloride 0.9 % injection 10 mL  10 mL Intracatheter PRN Si Gaul, MD         Review of Systems: 10 Point review of systems was completed and is negative except as noted above.   Physical Exam:   Blood pressure 115/76, pulse 111, temperature 97.7 F (36.5 C), temperature source Oral, resp. rate 18, height 5\' 4"  (1.626 m), weight 149 lb 12.8 oz (67.949 kg).  General appearance: Alert, cooperative, well nourished, no apparent distress  Head: Normocephalic, without obvious abnormality, atraumatic, no oral lesions/thrush  Neck: No adenopathy, thyroid not  enlarged, trachea is midline  Lymph nodes: Cervical, supraclavicular, and axillary nodes normal Resp: Clear to auscultation bilaterally  Cardio: Regular rate and rhythm, S1, S2 normal, no murmur, click, rub or gallop, right chest Port-A-Cath cover with occlusive dressing and EMLA cream  GI: Soft, non-tender, slight distension, hypoactive bowel sounds, no organomegaly  Extremities: Extremities normal, atraumatic, no cyanosis or edema  Neurologic: Alert and oriented X 3, no focal deficits, grossly normal  ECOG Performance Status: 0 - Asymptomatic   Laboratory Data: Results for orders placed in visit on 01/14/12 (from the past 48 hour(s))  CBC WITH DIFFERENTIAL     Status: Abnormal   Collection Time   01/14/12  9:16 AM      Component Value Range Comment   WBC 13.2 (*) 3.9 - 10.3 10e3/uL    NEUT# 9.8 (*) 1.5 - 6.5 10e3/uL    HGB 13.0  11.6 - 15.9 g/dL    HCT 16.1  09.6 - 04.5 %    Platelets 381  145 - 400 10e3/uL    MCV 89.6  79.5 - 101.0 fL    MCH 29.4  25.1 - 34.0 pg    MCHC 32.8  31.5 - 36.0 g/dL    RBC 4.09  8.11 - 9.14 10e6/uL    RDW 15.0 (*) 11.2 - 14.5 %    lymph# 2.1  0.9 - 3.3 10e3/uL    MONO# 1.2 (*) 0.1 - 0.9 10e3/uL    Eosinophils Absolute 0.1  0.0 - 0.5 10e3/uL    Basophils Absolute 0.1  0.0 - 0.1 10e3/uL    NEUT% 74.3  38.4 - 76.8 %    LYMPH% 15.8  14.0 - 49.7 %    MONO% 9.1  0.0 - 14.0 %    EOS% 0.4  0.0 -  7.0 %    BASO% 0.4  0.0 - 2.0 %   COMPREHENSIVE METABOLIC PANEL (CC13)     Status: Abnormal   Collection Time   01/14/12  9:16 AM      Component Value Range Comment   Sodium 143  136 - 145 mEq/L    Potassium 4.2  3.5 - 5.1 mEq/L    Chloride 109 (*) 98 - 107 mEq/L    CO2 25  22 - 29 mEq/L    Glucose 102 (*) 70 - 99 mg/dl    BUN 78.2  7.0 - 95.6 mg/dL    Creatinine 0.7  0.6 - 1.1 mg/dL    Total Bilirubin 2.13  0.20 - 1.20 mg/dL    Alkaline Phosphatase 84  40 - 150 U/L    AST 17  5 - 34 U/L    ALT 11  0 - 55 U/L    Total Protein 7.4  6.4 - 8.3 g/dL    Albumin 3.7  3.5 - 5.0 g/dL    Calcium 08.6  8.4 - 10.4 mg/dL      Imaging Studies: Ct Chest W Contrast  01/10/2012  *RADIOLOGY REPORT*  Clinical Data:  Metastatic lung cancer.  Restaging scan.  CT CHEST WITH CONTRAST  Technique:  Multidetector CT imaging of the chest, abdomen and pelvis was performed following the standard protocol during bolus administration of intravenous contrast.  Contrast: OMNIPAQUE IOHEXOL 300 MG/ML  SOLN  Comparison:  Multiple priors, most recently a CT of the chest abdomen and pelvis of 11/08/2011.  CT CHEST  Findings:  Mediastinum: Heart size is normal. There is no significant pericardial fluid, thickening or pericardial calcification. There is atherosclerosis of the  thoracic aorta, the great vessels of the mediastinum and the coronary arteries, including calcified atherosclerotic plaque in the left main, left anterior descending and left circumflex coronary arteries. No pathologically enlarged mediastinal or hilar lymph nodes. Esophagus is unremarkable in appearance.  Right internal jugular single lumen Port-A-Cath with tip terminating in the right atrium.  Lungs/Pleura: In the posterior aspect of the apex of the right hemithorax there is a pleural based nodular opacity that is similar in size and appearance to numerous prior examinations measuring approximately 2.2 x 1.2 cm on today's examination, most compatible  with an area of postradiation scarring.  There is a similar but smaller pleural-based opacity anteriorly as well, which is also similar and compatible with scarring.  Architectural distortion throughout the right upper lobe also reflects some postradiation changes.  No definite suspicious appearing pulmonary nodule or mass is identified to suggest local recurrence of disease or new metastatic disease to the lungs.  Mild diffuse bronchial wall thickening with a background of moderate centrilobular emphysema. No acute consolidative airspace disease.  No pleural effusions.  Musculoskeletal: There are no aggressive appearing lytic or blastic lesions noted in the visualized portions of the skeleton. Vertebroplasty changes at T4 and T7 are again noted.  An old compression fracture at T3 with a vertebral plana appearance is unchanged compared to prior examinations. There are no aggressive appearing lytic or blastic lesions noted in the visualized portions of the skeleton.  IMPRESSION:  1.  Similar post procedural changes of radiation therapy in the apex of the right lung, without evidence to suggest local recurrence of disease or new metastatic disease to the lungs on today's examination. 2. 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   Assessment for potential risk factor modification, dietary therapy or pharmacologic therapy may be warranted, if clinically indicated. 3.  Imaging findings compatible with underlying COPD. 4.  Old compression fracture of T3 with vertebral plana appearance, and post procedural changes of vertebroplasty and T4 and T7 redemonstrated, as above.    Original Report Authenticated By: Trudie Reed, M.D.    Ct Abdomen Pelvis W Contrast 01/10/2012  *RADIOLOGY REPORT*  Clinical Data:  Metastatic lung cancer.  Restaging scan.  CT ABDOMEN AND PELVIS WITH CONTRAST  Technique:  Multidetector CT imaging of the chest, abdomen and pelvis was performed following the  standard protocol during bolus administration of intravenous contrast.  Contrast: OMNIPAQUE IOHEXOL 300 MG/ML  SOLN  Comparison:  Multiple priors, most recently a CT of the chest abdomen and pelvis of 11/08/2011.  CT ABDOMEN AND PELVIS  Findings:  Abdomen/Pelvis: The appearance of the liver, gallbladder, pancreas, spleen, bilateral adrenal glands and right kidney is unremarkable. In the lower pole of the left kidney there is a subcentimeter low attenuation lesion which is too small to definitively characterize, but similar to numerous prior examinations, favored to represent a small cyst.  Extensive atherosclerosis of the abdominal and pelvic vasculature, without definite aneurysm or dissection.  On the morning there is no ascites or pneumoperitoneum and no pathologic distension of small bowel.  Normal appendix.  No definite pathologic lymphadenopathy identified within the abdomen and pelvis.  Uterus, bilateral ovaries and urinary bladder are unremarkable in appearance.  Musculoskeletal: There are no aggressive appearing lytic or blastic lesions noted in the visualized portions of the skeleton.  IMPRESSION:  1.  No findings to suggest metastatic disease to the abdomen or pelvis. 2.  Extensive atherosclerosis. 3.  Normal appendix. 4.  Unchanged subcentimeter low attenuation lesion in  the lower pole of the left kidney, favored to represent a small cyst.   Original Report Authenticated By: Trudie Reed, M.D.     Assessment/Plan: Ms. Kundert is a very pleasant 62 year-old Caucasian female with metastatic non-small cell lung cancer, adenocarcinoma currently on treatment with maintenance Alimta status post 75 cycles. The patient is tolerating her treatment fairly well. She has no evidence for disease progression on his recent scan of the chest, abdomen and pelvis. Her MRI of the brain performed at [facility] recently showed also no evidence for disease progression in the brain. Ms. Katha Hamming was seen  by Dr. Arbutus Ped. She'll proceed with cycle #76 for maintenance chemotherapy with single agent Alimta.  We plan to see her again in 3 weeks (02/04/2012) at which time we will repeat a CBC differential and CMET prior to her next scheduled cycle of maintenance Alimta.  Ms. Dickens is encouraged to contact us in the interim if she has any questions or concerns.   Larina Bras, NP-C 01/14/2012, 10:54 AM

## 2012-01-14 NOTE — Patient Instructions (Addendum)
Please contact us at [phone] if you have any questions or concerns. 

## 2012-01-14 NOTE — Telephone Encounter (Signed)
appt made and printed for pt aom °

## 2012-01-14 NOTE — Patient Instructions (Signed)
[facility] Discharge Instructions for Patients Receiving Chemotherapy  Today you received the following chemotherapy agents Danielle [name] To help prevent nausea and vomiting after your treatment, we encourage you to take your nausea medication as prescribed.  If you develop nausea and vomiting that is not controlled by your nausea medication, call the clinic. If it is after clinic hours your family physician or the after hours number for the clinic or go to the Emergency Department.   BELOW ARE SYMPTOMS THAT SHOULD BE REPORTED IMMEDIATELY:  *FEVER GREATER THAN 100.5 F  *CHILLS WITH OR WITHOUT FEVER  NAUSEA AND VOMITING THAT IS NOT CONTROLLED WITH YOUR NAUSEA MEDICATION  *UNUSUAL SHORTNESS OF BREATH  *UNUSUAL BRUISING OR BLEEDING  TENDERNESS IN MOUTH AND THROAT WITH OR WITHOUT PRESENCE OF ULCERS  *URINARY PROBLEMS  *BOWEL PROBLEMS  UNUSUAL RASH Items with * indicate a potential emergency and should be followed up as soon as possible.  One of the nurses will contact you 24 hours after your treatment. Please let the nurse know about any problems that you may have experienced. Feel free to call the clinic you have any questions or concerns. The clinic phone number is [phone].   I have been informed and understand all the instructions given to me. I know to contact the clinic, my physician, or go to the Emergency Department if any problems should occur. I do not have any questions at this time, but understand that I may call the clinic during office hours   should I have any questions or need assistance in obtaining follow up care.    __________________________________________  _____________  __________ Signature of Patient or Authorized Representative            Date                   Time    __________________________________________ Nurse's Signature

## 2012-01-18 ENCOUNTER — Other Ambulatory Visit: Payer: Self-pay | Admitting: Family Medicine

## 2012-01-28 ENCOUNTER — Telehealth: Payer: Self-pay | Admitting: Family Medicine

## 2012-01-28 ENCOUNTER — Other Ambulatory Visit (INDEPENDENT_AMBULATORY_CARE_PROVIDER_SITE_OTHER): Payer: Medicare Other

## 2012-01-28 DIAGNOSIS — E7800 Pure hypercholesterolemia, unspecified: Secondary | ICD-10-CM

## 2012-01-28 DIAGNOSIS — E119 Type 2 diabetes mellitus without complications: Secondary | ICD-10-CM

## 2012-01-28 DIAGNOSIS — D649 Anemia, unspecified: Secondary | ICD-10-CM

## 2012-01-28 LAB — LIPID PANEL
Cholesterol: 138 mg/dL (ref 0–200)
HDL: 31.8 mg/dL — ABNORMAL LOW (ref >39.00)
Triglycerides: 177 mg/dL — ABNORMAL HIGH (ref 0.0–149.0)

## 2012-01-28 NOTE — Telephone Encounter (Signed)
Message copied by [name] E on Tue Jan 28, 2012  8:22 AM ------      Message from: Liane Comber C      Created: Wed Jan 22, 2012 10:11 AM      Regarding: Cpx labs Tues 1/21       Please order  future cpx labs for pt's upcoming lab appt.      Thanks      Rodney Booze

## 2012-02-04 ENCOUNTER — Ambulatory Visit (HOSPITAL_BASED_OUTPATIENT_CLINIC_OR_DEPARTMENT_OTHER): Payer: Medicare Other

## 2012-02-04 ENCOUNTER — Encounter: Payer: Self-pay | Admitting: Physician Assistant

## 2012-02-04 ENCOUNTER — Other Ambulatory Visit (HOSPITAL_BASED_OUTPATIENT_CLINIC_OR_DEPARTMENT_OTHER): Payer: Medicare Other | Admitting: Lab

## 2012-02-04 ENCOUNTER — Encounter: Payer: Medicare Other | Admitting: Family Medicine

## 2012-02-04 ENCOUNTER — Telehealth: Payer: Self-pay | Admitting: *Deleted

## 2012-02-04 ENCOUNTER — Ambulatory Visit (HOSPITAL_BASED_OUTPATIENT_CLINIC_OR_DEPARTMENT_OTHER): Payer: Medicare Other | Admitting: Physician Assistant

## 2012-02-04 ENCOUNTER — Telehealth: Payer: Self-pay | Admitting: Internal Medicine

## 2012-02-04 ENCOUNTER — Other Ambulatory Visit: Payer: Medicare Other | Admitting: Lab

## 2012-02-04 VITALS — BP 137/77 | HR 104 | Temp 97.7°F | Resp 20 | Ht 64.0 in | Wt 151.7 lb

## 2012-02-04 DIAGNOSIS — Z5111 Encounter for antineoplastic chemotherapy: Secondary | ICD-10-CM | POA: Diagnosis not present

## 2012-02-04 DIAGNOSIS — C7931 Secondary malignant neoplasm of brain: Secondary | ICD-10-CM

## 2012-02-04 DIAGNOSIS — C3410 Malignant neoplasm of upper lobe, unspecified bronchus or lung: Secondary | ICD-10-CM

## 2012-02-04 DIAGNOSIS — C3490 Malignant neoplasm of unspecified part of unspecified bronchus or lung: Secondary | ICD-10-CM

## 2012-02-04 DIAGNOSIS — H6690 Otitis media, unspecified, unspecified ear: Secondary | ICD-10-CM

## 2012-02-04 LAB — CBC WITH DIFFERENTIAL/PLATELET
Basophils Absolute: 0 10*3/uL (ref 0.0–0.1)
Eosinophils Absolute: 0.1 10*3/uL (ref 0.0–0.5)
HCT: 39.2 % (ref 34.8–46.6)
HGB: 13 g/dL (ref 11.6–15.9)
LYMPH%: 23.2 % (ref 14.0–49.7)
MCH: 29.3 pg (ref 25.1–34.0)
MCV: 88.3 fL (ref 79.5–101.0)
MONO%: 8.6 % (ref 0.0–14.0)
NEUT#: 5.1 10*3/uL (ref 1.5–6.5)
NEUT%: 66.7 % (ref 38.4–76.8)
Platelets: 331 10*3/uL (ref 145–400)

## 2012-02-04 LAB — COMPREHENSIVE METABOLIC PANEL (CC13)
Albumin: 3.6 g/dL (ref 3.5–5.0)
Alkaline Phosphatase: 85 U/L (ref 40–150)
BUN: 6.9 mg/dL — ABNORMAL LOW (ref 7.0–26.0)
Creatinine: 0.8 mg/dL (ref 0.6–1.1)
Glucose: 137 mg/dl — ABNORMAL HIGH (ref 70–99)
Potassium: 3.4 mEq/L — ABNORMAL LOW (ref 3.5–5.1)
Total Bilirubin: 0.41 mg/dL (ref 0.20–1.20)

## 2012-02-04 MED ORDER — SODIUM CHLORIDE 0.9 % IV SOLN
Freq: Once | INTRAVENOUS | Status: AC
  Administered 2012-02-04: 12:00:00 via INTRAVENOUS

## 2012-02-04 MED ORDER — AMOXICILLIN-POT CLAVULANATE 875-125 MG PO TABS
1.0000 | ORAL_TABLET | Freq: Two times a day (BID) | ORAL | Status: DC
Start: 2012-02-04 — End: 2012-03-18

## 2012-02-04 MED ORDER — ONDANSETRON 8 MG/50ML IVPB (CHCC)
8.0000 mg | Freq: Once | INTRAVENOUS | Status: AC
  Administered 2012-02-04: 8 mg via INTRAVENOUS

## 2012-02-04 MED ORDER — SODIUM CHLORIDE 0.9 % IV SOLN
500.0000 mg/m2 | Freq: Once | INTRAVENOUS | Status: AC
  Administered 2012-02-04: 900 mg via INTRAVENOUS
  Filled 2012-02-04: qty 36

## 2012-02-04 MED ORDER — MORPHINE SULFATE 30 MG PO TABS: 30.0000 mg | ORAL_TABLET | ORAL | Status: DC | PRN

## 2012-02-04 MED ORDER — ALPRAZOLAM 0.25 MG PO TABS: 0.2500 mg | ORAL_TABLET | Freq: Every evening | ORAL | Status: DC | PRN

## 2012-02-04 MED ORDER — DEXAMETHASONE SODIUM PHOSPHATE 10 MG/ML IJ SOLN
10.0000 mg | Freq: Once | INTRAMUSCULAR | Status: AC
  Administered 2012-02-04: 10 mg via INTRAVENOUS

## 2012-02-04 MED ORDER — SODIUM CHLORIDE 0.9 % IJ SOLN
10.0000 mL | INTRAMUSCULAR | Status: DC | PRN
  Administered 2012-02-04: 10 mL
  Filled 2012-02-04: qty 10

## 2012-02-04 MED ORDER — MORPHINE SULFATE ER 60 MG PO TBCR: 60.0000 mg | EXTENDED_RELEASE_TABLET | Freq: Two times a day (BID) | ORAL | Status: DC

## 2012-02-04 MED ORDER — HEPARIN SOD (PORK) LOCK FLUSH 100 UNIT/ML IV SOLN
500.0000 [IU] | Freq: Once | INTRAVENOUS | Status: AC | PRN
  Administered 2012-02-04: 500 [IU]
  Filled 2012-02-04: qty 5

## 2012-02-04 NOTE — Telephone Encounter (Signed)
appts made and printed for pt aom °

## 2012-02-04 NOTE — Patient Instructions (Addendum)
Take the antibiotics as prescribed for your ear infection Follow up in 3 weeks prior to your next cycle of maintenance Alimta

## 2012-02-04 NOTE — Telephone Encounter (Signed)
Per staff phone call and POF I have schedueld appts.  JMW  

## 2012-02-04 NOTE — Progress Notes (Signed)
[facility]  Telephone:(336) [phone] Fax:(336) [phone]  OFFICE PROGRESS NOTE  Patient ID: Danielle [name], female   DOB: November 07, 1950, 62 y.o.  AVW:[id number]      WGN:[id number]  CC:  [name], MD  Diagnosis: Danielle [name] is a 62 y.o. Caucasian female with metastatic non-small cell lung cancer initially diagnosed with locally advanced stage IIIB with right Pancoast tumor involving the vertebral body as well as the foraminal canal invasion with spinal cord compression in October of 2007.   Prior Therapy: 1. Status post concurrent chemoradiation with weekly carboplatin and paclitaxel, last dose was given November 18, 2005. 2. 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 3. Status post gamma knife radiotherapy to a solitary brain lesion located in the superior frontal area of the brain at [facility] in April of 2008. 4. Status post palliative radiotherapy to the lateral abdominal wall metastatic lesion under the care of Dr. Mitzi Hansen, completed March of 2009. 5. Status post 6 cycles of systemic chemotherapy with carboplatin and Alimta. Last dose was given July 26, 2007 with disease stabilization.  Current Therapy: Maintenance chemotherapy with Alimta 500 mg per meter squared given every 3 weeks. The patient is status post 76 cycles   Interval History: Danielle [name]. [name] 62 year-old Caucasian female returns to the clinic today for follow up visit. She continues to tolerate her maintenance chemotherapy with Alimta without significant difficulty. She does complain of being a bit off balance and listing towards the left since Friday. She said head congestion and ear discomfort. She has cough that is nonproductive and has not had fever or chills. She requests a refill for her MS Contin, MSIR, and Xanax. She did have one episode of vomiting this morning however feels that he may more so be related to  being around her grandchildren who have been ill. She states that she is next due for followup at [facility] regarding her history of brain metastases in April of 2014. She denied weight loss or night sweats. She has no chest pain, shortness breath, or hemoptysis.  Medical History: Past Medical History  Diagnosis Date  . Hypertension   . Anemia   . H/O: pneumonia   . History of tobacco abuse quit 10/08    on nicotine patch  . Fibromyalgia   . Hypokalemia   . DJD (degenerative joint disease), cervical   . Thrush 12/11/2010  . 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 had chemo and radiation for lung ca    Surgical History: Past Surgical History  Procedure Date  . Cervical laminectomy 1995  . Other surgical history 2008    Gamma knife surgery to remove brain met   . 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 Knee surgery 1990    Left x 2  . Portacath placement -[name]    TIP IN CAVOATRIAL JUNCTION    Family History: Family History  Problem Relation Age of Onset  . Diabetes Father   . Lymphoma Father   . Cancer Father   . Liver cancer Mother   . Lung cancer Mother   . Cancer Mother   . Hyperlipidemia Brother   . Healthy Daughter   . Healthy Son   . Healthy Son   . Cancer Sister 34    ovarian cancer    Social History: History  Substance Use  Topics  . Smoking status: Former Smoker -- 2.0 packs/day for 30 years    Quit date: 10/07/2005  . Smokeless tobacco: Never Used  . Alcohol Use: No    Allergies: Allergies  Allergen Reactions  . Contrast Media (Iodinated Diagnostic Agents) Hives  . Co-Trimoxazole Injection (Sulfamethoxazole-Trimethoprim)   . Iohexol      Code: HIVES, Desc: pt needs 13 hour prep--prev hives; added allergy 06/02/07 slg   . Sulfa Drugs Cross Reactors     Current Medications: Current Outpatient Prescriptions  Medication Sig Dispense Refill  . ALPRAZolam (XANAX) 0.25 MG  tablet Take 1 tablet (0.25 mg total) by mouth at bedtime as needed.  30 tablet  0  . amLODipine (NORVASC) 10 MG tablet Take 1 tablet (10 mg total) by mouth daily.  90 tablet  3  . amoxicillin-clavulanate (AUGMENTIN) 875-125 MG per tablet Take 1 tablet by mouth 2 (two) times daily.  20 tablet  0  . folic acid (FOLVITE) 1 MG tablet Take 1 tablet (1 mg total) by mouth daily.  90 tablet  3  . lidocaine-prilocaine (EMLA) cream Apply topically as needed.  30 g  0  . lisinopril (PRINIVIL,ZESTRIL) 5 MG tablet TAKE 1 TABLET BY MOUTH EVERY DAY  90 tablet  0  . morphine (MS CONTIN) 60 MG 12 hr tablet Take 1 tablet (60 mg total) by mouth 2 (two) times daily.  60 tablet  0  . morphine (MSIR) 30 MG tablet Take 1 tablet (30 mg total) by mouth every 4 (four) hours as needed. Take 1 tab every 4-6 hours as needed for breakthrough pain  60 tablet  0  . ondansetron (ZOFRAN) 8 MG tablet Take 1 tablet (8 mg total) by mouth every 8 (eight) hours as needed for nausea.  30 tablet  2  . PATANOL 0.1 % ophthalmic solution PUT 1 DROP IN AFFECTED EYE(S) 2X A DAY  5 mL  1  . predniSONE (DELTASONE) 50 MG tablet 13 hrs, 7 hrs, and 1 hr before scheduled CT scans      . simvastatin (ZOCOR) 40 MG tablet Take 1 tablet (40 mg total) by mouth daily.  90 tablet  1  . simvastatin (ZOCOR) 40 MG tablet TAKE 1 TABLET (40 MG TOTAL) BY MOUTH DAILY.  30 tablet  5   No current facility-administered medications for this visit.   Facility-Administered Medications Ordered in Other Visits  Medication Dose Route Frequency Provider Last Rate Last Dose  . sodium chloride 0.9 % injection 10 mL  10 mL Intracatheter PRN Si Gaul, MD   10 mL at 02/04/12 1230     Review of Systems: 10 Point review of systems was completed and is negative except as noted above.   Physical Exam:   Blood pressure 137/77, pulse 104, temperature 97.7 F (36.5 C), temperature source Oral, resp. rate 20, height 5\' 4"  (1.626 m), weight 151 lb 11.2 oz (68.811  kg).  General appearance: Alert, cooperative, well nourished, no apparent distress  Head: Normocephalic, without obvious abnormality, atraumatic, no oral lesions/thrush  TMs: The right TM is injected with erythema and fullness and loss of light reflex left TM is injected Neck: No adenopathy, thyroid not enlarged, trachea is midline  Lymph nodes: Cervical, supraclavicular, and axillary nodes normal Resp: Clear to auscultation bilaterally  Cardio: Regular rate and rhythm, S1, S2 normal, no murmur, click, rub or gallop, right chest Port-A-Cath cover with occlusive dressing and EMLA cream  GI: Soft, non-tender, slight distension, hypoactive bowel sounds, no organomegaly  Extremities: Extremities normal, atraumatic, no cyanosis or edema  Neurologic: Alert and oriented X 3, no focal deficits, grossly normal  ECOG Performance Status: 0 - Asymptomatic   Laboratory Data: Results for orders placed in visit on 02/04/12 (from the past 48 hour(s))  CBC WITH DIFFERENTIAL     Status: Abnormal   Collection Time   02/04/12 10:11 AM      Component Value Range Comment   WBC 7.7  3.9 - 10.3 10e3/uL    NEUT# 5.1  1.5 - 6.5 10e3/uL    HGB 13.0  11.6 - 15.9 g/dL    HCT 16.1  09.6 - 04.5 %    Platelets 331  145 - 400 10e3/uL    MCV 88.3  79.5 - 101.0 fL    MCH 29.3  25.1 - 34.0 pg    MCHC 33.2  31.5 - 36.0 g/dL    RBC 4.09  8.11 - 9.14 10e6/uL    RDW 15.5 (*) 11.2 - 14.5 %    lymph# 1.8  0.9 - 3.3 10e3/uL    MONO# 0.7  0.1 - 0.9 10e3/uL    Eosinophils Absolute 0.1  0.0 - 0.5 10e3/uL    Basophils Absolute 0.0  0.0 - 0.1 10e3/uL    NEUT% 66.7  38.4 - 76.8 %    LYMPH% 23.2  14.0 - 49.7 %    MONO% 8.6  0.0 - 14.0 %    EOS% 1.0  0.0 - 7.0 %    BASO% 0.5  0.0 - 2.0 %   COMPREHENSIVE METABOLIC PANEL (CC13)     Status: Abnormal   Collection Time   02/04/12 10:12 AM      Component Value Range Comment   Sodium 141  136 - 145 mEq/L    Potassium 3.4 (*) 3.5 - 5.1 mEq/L    Chloride 106  98 - 107 mEq/L     CO2 25  22 - 29 mEq/L    Glucose 137 (*) 70 - 99 mg/dl    BUN 6.9 (*) 7.0 - 78.2 mg/dL    Creatinine 0.8  0.6 - 1.1 mg/dL    Total Bilirubin 9.56  0.20 - 1.20 mg/dL    Alkaline Phosphatase 85  40 - 150 U/L    AST 18  5 - 34 U/L    ALT 13  0 - 55 U/L    Total Protein 7.5  6.4 - 8.3 g/dL    Albumin 3.6  3.5 - 5.0 g/dL    Calcium 9.8  8.4 - 21.3 mg/dL      Imaging Studies: Ct Chest W Contrast  01/10/2012  *RADIOLOGY REPORT*  Clinical Data:  Metastatic lung cancer.  Restaging scan.  CT CHEST WITH CONTRAST  Technique:  Multidetector CT imaging of the chest, abdomen and pelvis was performed following the standard protocol during bolus administration of intravenous contrast.  Contrast: OMNIPAQUE IOHEXOL 300 MG/ML  SOLN  Comparison:  Multiple priors, most recently a CT of the chest abdomen and pelvis of 11/08/2011.  CT CHEST  Findings:  Mediastinum: Heart size is normal. There is no significant pericardial fluid, thickening or pericardial calcification. There is atherosclerosis of the thoracic aorta, the great vessels of the mediastinum and the coronary arteries, including calcified atherosclerotic plaque in the left main, left anterior descending and left circumflex coronary arteries. No pathologically enlarged mediastinal or hilar lymph nodes. Esophagus is unremarkable in appearance.  Right internal jugular single lumen Port-A-Cath with tip terminating in the right atrium.  Lungs/Pleura:  In the posterior aspect of the apex of the right hemithorax there is a pleural based nodular opacity that is similar in size and appearance to numerous prior examinations measuring approximately 2.2 x 1.2 cm on today's examination, most compatible with an area of postradiation scarring.  There is a similar but smaller pleural-based opacity anteriorly as well, which is also similar and compatible with scarring.  Architectural distortion throughout the right upper lobe also reflects some postradiation changes.  No  definite suspicious appearing pulmonary nodule or mass is identified to suggest local recurrence of disease or new metastatic disease to the lungs.  Mild diffuse bronchial wall thickening with a background of moderate centrilobular emphysema. No acute consolidative airspace disease.  No pleural effusions.  Musculoskeletal: There are no aggressive appearing lytic or blastic lesions noted in the visualized portions of the skeleton. Vertebroplasty changes at T4 and T7 are again noted.  An old compression fracture at T3 with a vertebral plana appearance is unchanged compared to prior examinations. There are no aggressive appearing lytic or blastic lesions noted in the visualized portions of the skeleton.  IMPRESSION:  1.  Similar post procedural changes of radiation therapy in the apex of the right lung, without evidence to suggest local recurrence of disease or new metastatic disease to the lungs on today's examination. 2. 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   Assessment for potential risk factor modification, dietary therapy or pharmacologic therapy may be warranted, if clinically indicated. 3.  Imaging findings compatible with underlying COPD. 4.  Old compression fracture of T3 with vertebral plana appearance, and post procedural changes of vertebroplasty and T4 and T7 redemonstrated, as above.    Original Report Authenticated By: Trudie Reed, M.D.    Ct Abdomen Pelvis W Contrast 01/10/2012  *RADIOLOGY REPORT*  Clinical Data:  Metastatic lung cancer.  Restaging scan.  CT ABDOMEN AND PELVIS WITH CONTRAST  Technique:  Multidetector CT imaging of the chest, abdomen and pelvis was performed following the standard protocol during bolus administration of intravenous contrast.  Contrast: OMNIPAQUE IOHEXOL 300 MG/ML  SOLN  Comparison:  Multiple priors, most recently a CT of the chest abdomen and pelvis of 11/08/2011.  CT ABDOMEN AND PELVIS  Findings:  Abdomen/Pelvis: The  appearance of the liver, gallbladder, pancreas, spleen, bilateral adrenal glands and right kidney is unremarkable. In the lower pole of the left kidney there is a subcentimeter low attenuation lesion which is too small to definitively characterize, but similar to numerous prior examinations, favored to represent a small cyst.  Extensive atherosclerosis of the abdominal and pelvic vasculature, without definite aneurysm or dissection.  On the morning there is no ascites or pneumoperitoneum and no pathologic distension of small bowel.  Normal appendix.  No definite pathologic lymphadenopathy identified within the abdomen and pelvis.  Uterus, bilateral ovaries and urinary bladder are unremarkable in appearance.  Musculoskeletal: There are no aggressive appearing lytic or blastic lesions noted in the visualized portions of the skeleton.  IMPRESSION:  1.  No findings to suggest metastatic disease to the abdomen or pelvis. 2.  Extensive atherosclerosis. 3.  Normal appendix. 4.  Unchanged subcentimeter low attenuation lesion in the lower pole of the left kidney, favored to represent a small cyst.   Original Report Authenticated By: Trudie Reed, M.D.     Assessment/Plan: Danielle [name] is a very pleasant 62 year-old Caucasian female with metastatic non-small cell lung cancer, adenocarcinoma currently on treatment with maintenance Alimta status post 76 cycles. The patient is  tolerating her treatment fairly well. She has no evidence for disease progression on his recent scan of the chest, abdomen and pelvis. Her MRI of the brain performed [facility] recently showed also no evidence for disease progression in the brain. Patient was discussed with Dr. Arbutus Ped. She'll proceed with cycle #77 for maintenance chemotherapy with single agent Alimta.   She was given a prescription for her MS Contin, MSIR and Xanax. Her sensation of being off balance may be related to her current otitis media. She will be treated with  a 10 day course of Augmentin to address the otitis media. Should her symptoms of imbalance persist after adequate treatment of the ear infection we will proceed to obtain imaging of her head to look for possible recurrent brain metastases. Patient was in agreement with the plan.  Danielle [name] is encouraged to contact us in the interim if she has any questions or concerns.   Tiana Loft E, PA-C 02/04/2012, 3:08 PM

## 2012-02-04 NOTE — Patient Instructions (Signed)
[facility] Discharge Instructions for Patients Receiving Chemotherapy  Today you received the following chemotherapy agents Alimta  To help prevent nausea and vomiting after your treatment, we encourage you to take your nausea medication as directed.   If you develop nausea and vomiting that is not controlled by your nausea medication, call the clinic. If it is after clinic hours your family physician or the after hours number for the clinic or go to the Emergency Department.   BELOW ARE SYMPTOMS THAT SHOULD BE REPORTED IMMEDIATELY:  *FEVER GREATER THAN 100.5 F  *CHILLS WITH OR WITHOUT FEVER  NAUSEA AND VOMITING THAT IS NOT CONTROLLED WITH YOUR NAUSEA MEDICATION  *UNUSUAL SHORTNESS OF BREATH  *UNUSUAL BRUISING OR BLEEDING  TENDERNESS IN MOUTH AND THROAT WITH OR WITHOUT PRESENCE OF ULCERS  *URINARY PROBLEMS  *BOWEL PROBLEMS  UNUSUAL RASH Items with * indicate a potential emergency and should be followed up as soon as possible.  One of the nurses will contact you 24 hours after your treatment. Please let the nurse know about any problems that you may have experienced. Feel free to call the clinic you have any questions or concerns. The clinic phone number is [phone].   I have been informed and understand all the instructions given to me. I know to contact the clinic, my physician, or go to the Emergency Department if any problems should occur. I do not have any questions at this time, but understand that I may call the clinic during office hours   should I have any questions or need assistance in obtaining follow up care.    __________________________________________  _____________  __________ Signature of Patient or Authorized Representative            Date                   Time    __________________________________________ Nurse's Signature    

## 2012-02-07 ENCOUNTER — Encounter: Payer: Self-pay | Admitting: Family Medicine

## 2012-02-07 ENCOUNTER — Ambulatory Visit (INDEPENDENT_AMBULATORY_CARE_PROVIDER_SITE_OTHER): Payer: Medicare Other | Admitting: Family Medicine

## 2012-02-07 VITALS — BP 110/72 | HR 108 | Temp 98.5°F | Ht 64.0 in | Wt 157.0 lb

## 2012-02-07 DIAGNOSIS — I10 Essential (primary) hypertension: Secondary | ICD-10-CM

## 2012-02-07 DIAGNOSIS — H9391 Unspecified disorder of right ear: Secondary | ICD-10-CM

## 2012-02-07 DIAGNOSIS — Z0000 Encounter for general adult medical examination without abnormal findings: Secondary | ICD-10-CM

## 2012-02-07 DIAGNOSIS — E119 Type 2 diabetes mellitus without complications: Secondary | ICD-10-CM

## 2012-02-07 DIAGNOSIS — H9390 Unspecified disorder of ear, unspecified ear: Secondary | ICD-10-CM | POA: Diagnosis not present

## 2012-02-07 DIAGNOSIS — E7800 Pure hypercholesterolemia, unspecified: Secondary | ICD-10-CM | POA: Diagnosis not present

## 2012-02-07 LAB — HM DIABETES FOOT EXAM

## 2012-02-07 MED ORDER — ANTIPYRINE-BENZOCAINE 5.4-1.4 % OT SOLN: 3.0000 [drp] | OTIC | Status: AC | PRN

## 2012-02-07 MED ORDER — MECLIZINE HCL 12.5 MG PO TABS: 12.5000 mg | ORAL_TABLET | Freq: Three times a day (TID) | ORAL | Status: DC | PRN

## 2012-02-07 NOTE — Patient Instructions (Addendum)
Can use meclizine for vertigo if needed. If ear pain can use auralgan. Follow up for 6 month follow up, labs prior.

## 2012-02-07 NOTE — Progress Notes (Signed)
Subjective:    Patient ID: Danielle [name], female    DOB: 05/17/1950, 62 y.o.   MRN: [id number]  HPI I have personally reviewed the Medicare Annual Wellness questionnaire and have noted 1. The patient's medical and social history 2. Their use of alcohol, tobacco or illicit drugs 3. Their current medications and supplements 4. The patient's functional ability including ADL's, fall risks, home safety risks and hearing or visual             impairment. 5. Diet and physical activities 6. Evidence for depression or mood disorders The patients weight, height, BMI and visual acuity have been recorded in the chart I have made referrals, counseling and provided education to the patient based review of the above and I have provided the pt with a written personalized care plan for preventive services.  Lung cancer, metastatic On 77 chemo cycle!!!.. MRI brain.stable 6 months ago no  tumor recurrence at this time. Sees ONC regularly. Chemo recieves every 3 weeks.  CT scan of lungs and abdomen/pelvis stable at last check 3 weeks ago.  Pain well controlled with morphine..some constipation..hard stool.  Sleeping well. Using alprazolam.   Diabetes: Well controlled with diet  Lab Results  Component Value Date   HGBA1C 6.0 01/28/2012              Using medications without difficulties:  Hypoglycemic episodes: None  Hyperglycemic episodes:None  Feet problems: No ulcers Blood Sugars averaging: not checking at home.  eye exam within last year: 01/2011  Hypertension: Well controlled on amlodipine and lisinopril  Using medication without problems or lightheadedness:  None Chest pain with exertion:None  Edema:Occ  Short of breath: Yes, but stable  Average home BPs: At goal.  Other issues:   Elevated Cholesterol: On zocor.  Lab Results  Component Value Date   CHOL 138 01/28/2012   HDL 31.80* 01/28/2012   LDLCALC 71 01/28/2012   LDLDIRECT 165.4 09/20/2008   TRIG 177.0* 01/28/2012   CHOLHDL 4  01/28/2012  Using medications without problems:Yes  Muscle aches: None  Other complaints:    Otitis Media: Seen by onc on 1/28. Dx at that time with OM. Started on augmentin. She comes to clinic today with continued ear symtpoms.  She is feeling better but is still having issues. She is having dizziness and off balance. Vertigo. No fever since on antibiotics. Loose stool from antibiotics.  Occ emesis from vertigo  Last chemo was 1/28 as well... She feels nauseous after this. Fridays are bad days.   Review of Systems  Constitutional: Negative for fever and fatigue.  HENT: Positive for ear pain.   Eyes: Negative for pain.  Respiratory: Negative for chest tightness and shortness of breath.   Cardiovascular: Negative for chest pain, palpitations and leg swelling.  Gastrointestinal: Negative for abdominal pain.  Genitourinary: Negative for dysuria.       Objective:   Physical Exam  Constitutional: Vital signs are normal. She appears well-developed and well-nourished. She is cooperative.  Non-toxic appearance. She does not appear ill. No distress.  HENT:  Head: Normocephalic.  Right Ear: Hearing, external ear and ear canal normal. Tympanic membrane is not erythematous, not retracted and not bulging. A middle ear effusion is present.  Left Ear: Hearing, external ear and ear canal normal. Tympanic membrane is not erythematous, not retracted and not bulging. A middle ear effusion is present.  Nose: No mucosal edema or rhinorrhea. Right sinus exhibits no maxillary sinus tenderness and no frontal sinus tenderness. Left sinus  exhibits no maxillary sinus tenderness and no frontal sinus tenderness.  Mouth/Throat: Uvula is midline, oropharynx is clear and moist and mucous membranes are normal.  Eyes: Conjunctivae normal, EOM and lids are normal. Pupils are equal, round, and reactive to light. No foreign bodies found.  Neck: Trachea normal and normal range of motion. Neck supple. Carotid bruit is  not present. No mass and no thyromegaly present.  Cardiovascular: Normal rate, regular rhythm, S1 normal, S2 normal, normal heart sounds, intact distal pulses and normal pulses.  Exam reveals no gallop and no friction rub.   No murmur heard. Pulmonary/Chest: Effort normal and breath sounds normal. Not tachypneic. No respiratory distress. She has no decreased breath sounds. She has no wheezes. She has no rhonchi. She has no rales.  Abdominal: Soft. Normal appearance and bowel sounds are normal. There is no tenderness.  Neurological: She is alert.  Skin: Skin is warm, dry and intact. No rash noted.  Psychiatric: Her speech is normal and behavior is normal. Judgment and thought content normal. Her mood appears not anxious. Cognition and memory are normal. She does not exhibit a depressed mood.          Assessment & Plan:  The patient's preventative maintenance and recommended screening tests for an annual wellness exam were reviewed in full today. Brought up to date unless services declined.  Counselled on the importance of diet, exercise, and its role in overall health and mortality. The patient's FH and SH was reviewed, including their home life, tobacco status, and drug and alcohol status.   Vaccines: Uptodate with vaccines. Not interested in prevention given lung cancer diagnosis.

## 2012-02-07 NOTE — Assessment & Plan Note (Addendum)
Improving. Can use meclizine for vertigo if needed. If ear pain can use auralgan.

## 2012-02-11 NOTE — Assessment & Plan Note (Signed)
Well controlled. Continue current medication.  

## 2012-02-11 NOTE — Assessment & Plan Note (Addendum)
Discussed whether she wants to continue treating this. She reports as long as she is doing well she will continue. LDL is at goal on current med.

## 2012-02-11 NOTE — Assessment & Plan Note (Signed)
Well-controlled on current regimen. ?

## 2012-02-23 ENCOUNTER — Other Ambulatory Visit: Payer: Self-pay | Admitting: Family Medicine

## 2012-02-25 ENCOUNTER — Other Ambulatory Visit: Payer: Medicare Other | Admitting: Lab

## 2012-02-25 ENCOUNTER — Telehealth: Payer: Self-pay | Admitting: Internal Medicine

## 2012-02-25 ENCOUNTER — Encounter: Payer: Self-pay | Admitting: Physician Assistant

## 2012-02-25 ENCOUNTER — Ambulatory Visit (HOSPITAL_BASED_OUTPATIENT_CLINIC_OR_DEPARTMENT_OTHER): Payer: Medicare Other | Admitting: Physician Assistant

## 2012-02-25 ENCOUNTER — Ambulatory Visit (HOSPITAL_BASED_OUTPATIENT_CLINIC_OR_DEPARTMENT_OTHER): Payer: Medicare Other

## 2012-02-25 VITALS — BP 132/72 | HR 73 | Temp 97.7°F | Resp 20 | Ht 64.0 in | Wt 153.6 lb

## 2012-02-25 DIAGNOSIS — C3410 Malignant neoplasm of upper lobe, unspecified bronchus or lung: Secondary | ICD-10-CM

## 2012-02-25 DIAGNOSIS — C3490 Malignant neoplasm of unspecified part of unspecified bronchus or lung: Secondary | ICD-10-CM

## 2012-02-25 DIAGNOSIS — Z5111 Encounter for antineoplastic chemotherapy: Secondary | ICD-10-CM

## 2012-02-25 DIAGNOSIS — C7949 Secondary malignant neoplasm of other parts of nervous system: Secondary | ICD-10-CM

## 2012-02-25 DIAGNOSIS — C7931 Secondary malignant neoplasm of brain: Secondary | ICD-10-CM | POA: Diagnosis not present

## 2012-02-25 DIAGNOSIS — B370 Candidal stomatitis: Secondary | ICD-10-CM

## 2012-02-25 LAB — CBC WITH DIFFERENTIAL/PLATELET
BASO%: 0.4 % (ref 0.0–2.0)
EOS%: 1 % (ref 0.0–7.0)
HCT: 37 % (ref 34.8–46.6)
LYMPH%: 37.2 % (ref 14.0–49.7)
MCH: 29.4 pg (ref 25.1–34.0)
MCHC: 33.3 g/dL (ref 31.5–36.0)
MONO#: 0.7 10*3/uL (ref 0.1–0.9)
NEUT%: 50 % (ref 38.4–76.8)
RBC: 4.2 10*6/uL (ref 3.70–5.45)
WBC: 5.9 10*3/uL (ref 3.9–10.3)
lymph#: 2.2 10*3/uL (ref 0.9–3.3)

## 2012-02-25 MED ORDER — SODIUM CHLORIDE 0.9 % IJ SOLN
10.0000 mL | INTRAMUSCULAR | Status: DC | PRN
  Administered 2012-02-25: 10 mL
  Filled 2012-02-25: qty 10

## 2012-02-25 MED ORDER — SODIUM CHLORIDE 0.9 % IV SOLN
Freq: Once | INTRAVENOUS | Status: AC
  Administered 2012-02-25: 11:00:00 via INTRAVENOUS

## 2012-02-25 MED ORDER — FLUCONAZOLE 100 MG PO TABS: ORAL_TABLET | ORAL | Status: DC

## 2012-02-25 MED ORDER — DEXAMETHASONE SODIUM PHOSPHATE 10 MG/ML IJ SOLN
10.0000 mg | Freq: Once | INTRAMUSCULAR | Status: AC
  Administered 2012-02-25: 10 mg via INTRAVENOUS

## 2012-02-25 MED ORDER — HEPARIN SOD (PORK) LOCK FLUSH 100 UNIT/ML IV SOLN
500.0000 [IU] | Freq: Once | INTRAVENOUS | Status: AC | PRN
  Administered 2012-02-25: 500 [IU]
  Filled 2012-02-25: qty 5

## 2012-02-25 MED ORDER — SODIUM CHLORIDE 0.9 % IV SOLN
500.0000 mg/m2 | Freq: Once | INTRAVENOUS | Status: AC
  Administered 2012-02-25: 900 mg via INTRAVENOUS
  Filled 2012-02-25: qty 36

## 2012-02-25 MED ORDER — CYANOCOBALAMIN 1000 MCG/ML IJ SOLN
1000.0000 ug | Freq: Once | INTRAMUSCULAR | Status: AC
  Administered 2012-02-25: 1000 ug via INTRAMUSCULAR

## 2012-02-25 MED ORDER — ONDANSETRON 8 MG/50ML IVPB (CHCC)
8.0000 mg | Freq: Once | INTRAVENOUS | Status: AC
  Administered 2012-02-25: 8 mg via INTRAVENOUS

## 2012-02-25 NOTE — Progress Notes (Addendum)
[facility]  [phone] Fax:([phone]  OFFICE PROGRESS NOTE  Patient ID: Danielle [name], female   DOB: 1950/09/26, 62 y.o.  AVW:[id number]      WGN:[id number]  CC:  [name], MD  Diagnosis: Danielle [name] is a 62 y.o. Caucasian female with metastatic non-small cell lung cancer initially diagnosed with locally advanced stage IIIB with right Pancoast tumor involving the vertebral body as well as the foraminal canal invasion with spinal cord compression in October of 2007.   Prior Therapy: 1. Status post concurrent chemoradiation with weekly carboplatin and paclitaxel, last dose was given November 18, 2005. 2. 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 3. Status post gamma knife radiotherapy to a solitary brain lesion located in the superior frontal area of the brain at [facility] in April of 2008. 4. Status post palliative radiotherapy to the lateral abdominal wall metastatic lesion under the care of Dr. Mitzi Hansen, completed March of 2009. 5. Status post 6 cycles of systemic chemotherapy with carboplatin and Alimta. Last dose was given July 26, 2007 with disease stabilization.  Current Therapy: Maintenance chemotherapy with Alimta 500 mg per meter squared given every 3 weeks. The patient is status post 77 cycles   Interval History: Danielle [name] 62 year-old Caucasian female returns to the clinic today for follow up visit. She continues to tolerate her maintenance chemotherapy with Alimta without significant difficulty. She reports that her imbalance issues have improved although she continues to have some vertigo-type symptoms. She did see her primary care physician who placed her on some Antivert. She reports that she is to followup with Dr. Johny Drilling at [facility] in April with a repeat MRI of her head at that time as well to be reevaluated for her history of brain  metastasis. She reports decreased appetite. She has intermittent cough but states that this has been passed around amongst her grandchildren. She denied any fever chills or any secretions. She denied weight loss or night sweats. She has no chest pain, shortness breath, or hemoptysis. She reports that she is been taken off of her simvastatin by her primary care physician.  Medical History: Past Medical History  Diagnosis Date  . Hypertension   . Anemia   . H/O: pneumonia   . History of tobacco abuse quit 10/08    on nicotine patch  . Fibromyalgia   . Hypokalemia   . DJD (degenerative joint disease), cervical   . Thrush 12/11/2010  . 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 had chemo and radiation for lung ca    Surgical History: Past Surgical History  Procedure Laterality Date  . Cervical laminectomy  1995  . Other surgical history  2008    Gamma knife surgery to remove brain met   . 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 Knee surgery  1990    Left x 2  . Portacath placement  -[name]    TIP IN CAVOATRIAL JUNCTION    Family History: Family History  Problem Relation Age of Onset  . Diabetes Father   . Lymphoma Father   . Cancer Father   . Liver cancer Mother   . Lung cancer Mother   . Cancer Mother   . Hyperlipidemia Brother   . Healthy Daughter   . Healthy Son   . Healthy Son   .  Cancer Sister 11    ovarian cancer    Social History: History  Substance Use Topics  . Smoking status: Former Smoker -- 2.00 packs/day for 30 years    Quit date: 10/07/2005  . Smokeless tobacco: Never Used  . Alcohol Use: No    Allergies: Allergies  Allergen Reactions  . Contrast Media (Iodinated Diagnostic Agents) Hives  . Co-Trimoxazole Injection (Sulfamethoxazole-Trimethoprim)   . Iohexol      Code: HIVES, Desc: pt needs 13 hour prep--prev hives; added allergy 06/02/07 slg   . Sulfa Drugs Cross Reactors      Current Medications: Current Outpatient Prescriptions  Medication Sig Dispense Refill  . ALPRAZolam (XANAX) 0.25 MG tablet Take 1 tablet (0.25 mg total) by mouth at bedtime as needed.  30 tablet  0  . amLODipine (NORVASC) 10 MG tablet Take 1 tablet (10 mg total) by mouth daily.  90 tablet  3  . amoxicillin-clavulanate (AUGMENTIN) 875-125 MG per tablet Take 1 tablet by mouth 2 (two) times daily.  20 tablet  0  . fluconazole (DIFLUCAN) 100 MG tablet Take 2 tablets by mouth on day one, then take 1 tablet by mouth daily until completed  16 tablet  0  . folic acid (FOLVITE) 1 MG tablet Take 1 tablet (1 mg total) by mouth daily.  90 tablet  3  . lidocaine-prilocaine (EMLA) cream Apply topically as needed.  30 g  0  . lisinopril (PRINIVIL,ZESTRIL) 5 MG tablet TAKE 1 TABLET BY MOUTH EVERY DAY  90 tablet  0  . meclizine (ANTIVERT) 12.5 MG tablet Take 1-2 tablets (12.5-25 mg total) by mouth 3 (three) times daily as needed.  30 tablet  0  . morphine (MS CONTIN) 60 MG 12 hr tablet Take 1 tablet (60 mg total) by mouth 2 (two) times daily.  60 tablet  0  . morphine (MSIR) 30 MG tablet Take 1 tablet (30 mg total) by mouth every 4 (four) hours as needed. Take 1 tab every 4-6 hours as needed for breakthrough pain  60 tablet  0  . ondansetron (ZOFRAN) 8 MG tablet Take 1 tablet (8 mg total) by mouth every 8 (eight) hours as needed for nausea.  30 tablet  2  . PATANOL 0.1 % ophthalmic solution PUT 1 DROP IN AFFECTED EYE(S) 2X A DAY  5 mL  1  . predniSONE (DELTASONE) 50 MG tablet 13 hrs, 7 hrs, and 1 hr before scheduled CT scans       No current facility-administered medications for this visit.   Facility-Administered Medications Ordered in Other Visits  Medication Dose Route Frequency Provider Last Rate Last Dose  . heparin lock flush 100 unit/mL  500 Units Intracatheter Once PRN Si Gaul, MD      . sodium chloride 0.9 % injection 10 mL  10 mL Intracatheter PRN Si Gaul, MD         Review  of Systems: 10 Point review of systems was completed and is negative except as noted above.   Physical Exam:   Blood pressure 132/72, pulse 73, temperature 97.7 F (36.5 C), temperature source Oral, resp. rate 20, height 5\' 4"  (1.626 m), weight 153 lb 9.6 oz (69.673 kg).  General appearance: Alert, cooperative, well nourished, no apparent distress  Head: Normocephalic, without obvious abnormality, atraumatic, no oral lesions but moderate thrush Neck: No adenopathy, thyroid not enlarged, trachea is midline  Lymph nodes: Cervical, supraclavicular, and axillary nodes normal Resp: Clear to auscultation bilaterally  Cardio: Regular rate and rhythm,  S1, S2 normal, no murmur, click, rub or gallop, right chest Port-A-Cath cover with occlusive dressing and EMLA cream  GI: Soft, non-tender, slight distension, hypoactive bowel sounds, no organomegaly  Extremities: Extremities normal, atraumatic, no cyanosis or edema  Neurologic: Alert and oriented X 3, no focal deficits, grossly normal  ECOG Performance Status: 0 - Asymptomatic   Laboratory Data: Results for orders placed in visit on 02/25/12 (from the past 48 hour(s))  CBC WITH DIFFERENTIAL     Status: Abnormal   Collection Time    02/25/12  9:15 AM      Result Value Range   WBC 5.9  3.9 - 10.3 10e3/uL   NEUT# 2.9  1.5 - 6.5 10e3/uL   HGB 12.3  11.6 - 15.9 g/dL   HCT 16.1  09.6 - 04.5 %   Platelets 344  145 - 400 10e3/uL   MCV 88.1  79.5 - 101.0 fL   MCH 29.4  25.1 - 34.0 pg   MCHC 33.3  31.5 - 36.0 g/dL   RBC 4.09  8.11 - 9.14 10e6/uL   RDW 15.2 (*) 11.2 - 14.5 %   lymph# 2.2  0.9 - 3.3 10e3/uL   MONO# 0.7  0.1 - 0.9 10e3/uL   Eosinophils Absolute 0.1  0.0 - 0.5 10e3/uL   Basophils Absolute 0.0  0.0 - 0.1 10e3/uL   NEUT% 50.0  38.4 - 76.8 %   LYMPH% 37.2  14.0 - 49.7 %   MONO% 11.4  0.0 - 14.0 %   EOS% 1.0  0.0 - 7.0 %   BASO% 0.4  0.0 - 2.0 %     Imaging Studies: Ct Chest W Contrast  01/10/2012  *RADIOLOGY REPORT*  Clinical  Data:  Metastatic lung cancer.  Restaging scan.  CT CHEST WITH CONTRAST  Technique:  Multidetector CT imaging of the chest, abdomen and pelvis was performed following the standard protocol during bolus administration of intravenous contrast.  Contrast: OMNIPAQUE IOHEXOL 300 MG/ML  SOLN  Comparison:  Multiple priors, most recently a CT of the chest abdomen and pelvis of 11/08/2011.  CT CHEST  Findings:  Mediastinum: Heart size is normal. There is no significant pericardial fluid, thickening or pericardial calcification. There is atherosclerosis of the thoracic aorta, the great vessels of the mediastinum and the coronary arteries, including calcified atherosclerotic plaque in the left main, left anterior descending and left circumflex coronary arteries. No pathologically enlarged mediastinal or hilar lymph nodes. Esophagus is unremarkable in appearance.  Right internal jugular single lumen Port-A-Cath with tip terminating in the right atrium.  Lungs/Pleura: In the posterior aspect of the apex of the right hemithorax there is a pleural based nodular opacity that is similar in size and appearance to numerous prior examinations measuring approximately 2.2 x 1.2 cm on today's examination, most compatible with an area of postradiation scarring.  There is a similar but smaller pleural-based opacity anteriorly as well, which is also similar and compatible with scarring.  Architectural distortion throughout the right upper lobe also reflects some postradiation changes.  No definite suspicious appearing pulmonary nodule or mass is identified to suggest local recurrence of disease or new metastatic disease to the lungs.  Mild diffuse bronchial wall thickening with a background of moderate centrilobular emphysema. No acute consolidative airspace disease.  No pleural effusions.  Musculoskeletal: There are no aggressive appearing lytic or blastic lesions noted in the visualized portions of the skeleton. Vertebroplasty  changes at T4 and T7 are again noted.  An old compression fracture  at T3 with a vertebral plana appearance is unchanged compared to prior examinations. There are no aggressive appearing lytic or blastic lesions noted in the visualized portions of the skeleton.  IMPRESSION:  1.  Similar post procedural changes of radiation therapy in the apex of the right lung, without evidence to suggest local recurrence of disease or new metastatic disease to the lungs on today's examination. 2. 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   Assessment for potential risk factor modification, dietary therapy or pharmacologic therapy may be warranted, if clinically indicated. 3.  Imaging findings compatible with underlying COPD. 4.  Old compression fracture of T3 with vertebral plana appearance, and post procedural changes of vertebroplasty and T4 and T7 redemonstrated, as above.    Original Report Authenticated By: Trudie Reed, M.D.    Ct Abdomen Pelvis W Contrast 01/10/2012  *RADIOLOGY REPORT*  Clinical Data:  Metastatic lung cancer.  Restaging scan.  CT ABDOMEN AND PELVIS WITH CONTRAST  Technique:  Multidetector CT imaging of the chest, abdomen and pelvis was performed following the standard protocol during bolus administration of intravenous contrast.  Contrast: OMNIPAQUE IOHEXOL 300 MG/ML  SOLN  Comparison:  Multiple priors, most recently a CT of the chest abdomen and pelvis of 11/08/2011.  CT ABDOMEN AND PELVIS  Findings:  Abdomen/Pelvis: The appearance of the liver, gallbladder, pancreas, spleen, bilateral adrenal glands and right kidney is unremarkable. In the lower pole of the left kidney there is a subcentimeter low attenuation lesion which is too small to definitively characterize, but similar to numerous prior examinations, favored to represent a small cyst.  Extensive atherosclerosis of the abdominal and pelvic vasculature, without definite aneurysm or dissection.  On the  morning there is no ascites or pneumoperitoneum and no pathologic distension of small bowel.  Normal appendix.  No definite pathologic lymphadenopathy identified within the abdomen and pelvis.  Uterus, bilateral ovaries and urinary bladder are unremarkable in appearance.  Musculoskeletal: There are no aggressive appearing lytic or blastic lesions noted in the visualized portions of the skeleton.  IMPRESSION:  1.  No findings to suggest metastatic disease to the abdomen or pelvis. 2.  Extensive atherosclerosis. 3.  Normal appendix. 4.  Unchanged subcentimeter low attenuation lesion in the lower pole of the left kidney, favored to represent a small cyst.   Original Report Authenticated By: Trudie Reed, M.D.     Assessment/Plan: Danielle [name] is a very pleasant 62 year-old Caucasian female with metastatic non-small cell lung cancer, adenocarcinoma currently on treatment with maintenance Alimta status post 77 cycles. The patient is tolerating her treatment fairly well. She has no evidence for disease progression on his recent scan of the chest, abdomen and pelvis. Her last MRI of the brain performed at [facility] recently showed also no evidence for disease progression in the brain. Patient was discussed with Dr. Arbutus Ped. She'll proceed with cycle #78 for maintenance chemotherapy with single agent Alimta.  She is advised to continue the meclizine as prescribed by her primary care physician. She will followup with Dr. Arbutus Ped in 3 weeks with a repeat CBC differential, C. met and CT of the chest, abdomen and pelvis with contrast to reevaluate her disease.. As with previous CT scans with contrast she will take her prednisone and Benadryl prior to the CT scan. Patient voiced understanding. Should the symptoms of vertigo worse than prior to her return visit she will inform us and we will arrange imaging of her head to look for possible brain metastasis.  Patient was in agreement with the plan.  Danielle [name] is  encouraged to contact us in the interim if she has any questions or concerns. Prescription for Diflucan to address the oral candidiasis was sent her pharmacy of record via E. scribed.   Tiana Loft E, PA-C 02/25/2012, 11:36 AM

## 2012-02-25 NOTE — Patient Instructions (Addendum)
Take the Diflucan as prescribed for your thrush Followup with Dr. Arbutus Ped in 3 weeks with a restaging CT scan of your chest, abdomen and pelvis. As previously take your prednisone and Benadryl prior to your restaging CT scans with contrast.

## 2012-02-25 NOTE — Patient Instructions (Addendum)
[facility] Discharge Instructions for Patients Receiving Chemotherapy  Today you received the following chemotherapy agents Alimta To help prevent nausea and vomiting after your treatment, we encourage you to take your nausea medication as prescribed. Begin taking it as directed and take it as often as prescribed for the next 48-72 hours.   If you develop nausea and vomiting that is not controlled by your nausea medication, call the clinic. If it is after clinic hours your family physician or the after hours number for the clinic or go to the Emergency Department.   BELOW ARE SYMPTOMS THAT SHOULD BE REPORTED IMMEDIATELY:  *FEVER GREATER THAN 100.5 F  *CHILLS WITH OR WITHOUT FEVER  NAUSEA AND VOMITING THAT IS NOT CONTROLLED WITH YOUR NAUSEA MEDICATION  *UNUSUAL SHORTNESS OF BREATH  *UNUSUAL BRUISING OR BLEEDING  TENDERNESS IN MOUTH AND THROAT WITH OR WITHOUT PRESENCE OF ULCERS  *URINARY PROBLEMS  *BOWEL PROBLEMS  UNUSUAL RASH Items with * indicate a potential emergency and should be followed up as soon as possible.  One of the nurses will contact you 24 hours after your treatment. Please let the nurse know about any problems that you may have experienced. Feel free to call the clinic you have any questions or concerns. The clinic phone number is [phone].   I have been informed and understand all the instructions given to me. I know to contact the clinic, my physician, or go to the Emergency Department if any problems should occur. I do not have any questions at this time, but understand that I may call the clinic during office hours   should I have any questions or need assistance in obtaining follow up care.    __________________________________________  _____________  __________ Signature of Patient or Authorized Representative            Date                   Time    __________________________________________ Nurse's Signature

## 2012-02-26 ENCOUNTER — Other Ambulatory Visit: Payer: Self-pay | Admitting: Certified Registered Nurse Anesthetist

## 2012-02-27 ENCOUNTER — Telehealth: Payer: Self-pay | Admitting: *Deleted

## 2012-02-27 NOTE — Telephone Encounter (Signed)
Per staff message and POF I have scheduled appts.  JMW  

## 2012-03-05 ENCOUNTER — Other Ambulatory Visit: Payer: Self-pay | Admitting: *Deleted

## 2012-03-05 MED ORDER — ALPRAZOLAM 0.25 MG PO TABS: 0.2500 mg | ORAL_TABLET | Freq: Every evening | ORAL | Status: DC | PRN

## 2012-03-05 MED ORDER — MORPHINE SULFATE ER 60 MG PO TBCR: 60.0000 mg | EXTENDED_RELEASE_TABLET | Freq: Two times a day (BID) | ORAL | Status: DC

## 2012-03-05 MED ORDER — MORPHINE SULFATE 30 MG PO TABS: 30.0000 mg | ORAL_TABLET | ORAL | Status: DC | PRN

## 2012-03-16 ENCOUNTER — Ambulatory Visit (HOSPITAL_COMMUNITY)
Admission: RE | Admit: 2012-03-16 | Discharge: 2012-03-16 | Disposition: A | Discharge status: Home / Self Care | Payer: Medicare Other | Source: Ambulatory Visit | Attending: Physician Assistant | Admitting: Physician Assistant

## 2012-03-16 ENCOUNTER — Encounter (HOSPITAL_COMMUNITY): Payer: Self-pay

## 2012-03-16 DIAGNOSIS — X58XXXA Exposure to other specified factors, initial encounter: Secondary | ICD-10-CM | POA: Insufficient documentation

## 2012-03-16 DIAGNOSIS — S22009A Unspecified fracture of unspecified thoracic vertebra, initial encounter for closed fracture: Secondary | ICD-10-CM | POA: Insufficient documentation

## 2012-03-16 DIAGNOSIS — C3490 Malignant neoplasm of unspecified part of unspecified bronchus or lung: Secondary | ICD-10-CM | POA: Insufficient documentation

## 2012-03-16 DIAGNOSIS — R935 Abnormal findings on diagnostic imaging of other abdominal regions, including retroperitoneum: Secondary | ICD-10-CM | POA: Diagnosis not present

## 2012-03-16 DIAGNOSIS — J438 Other emphysema: Secondary | ICD-10-CM | POA: Diagnosis not present

## 2012-03-16 DIAGNOSIS — R091 Pleurisy: Secondary | ICD-10-CM | POA: Diagnosis not present

## 2012-03-16 MED ORDER — IOHEXOL 300 MG/ML  SOLN: 100.0000 mL | Freq: Once | INTRAMUSCULAR | Status: AC | PRN

## 2012-03-18 ENCOUNTER — Ambulatory Visit (HOSPITAL_BASED_OUTPATIENT_CLINIC_OR_DEPARTMENT_OTHER): Payer: Medicare Other

## 2012-03-18 ENCOUNTER — Encounter: Payer: Self-pay | Admitting: Internal Medicine

## 2012-03-18 ENCOUNTER — Other Ambulatory Visit (HOSPITAL_BASED_OUTPATIENT_CLINIC_OR_DEPARTMENT_OTHER): Payer: Medicare Other | Admitting: Lab

## 2012-03-18 ENCOUNTER — Ambulatory Visit (HOSPITAL_BASED_OUTPATIENT_CLINIC_OR_DEPARTMENT_OTHER): Payer: Medicare Other | Admitting: Internal Medicine

## 2012-03-18 ENCOUNTER — Telehealth: Payer: Self-pay | Admitting: Internal Medicine

## 2012-03-18 VITALS — BP 138/64 | HR 73 | Temp 97.9°F | Resp 20 | Ht 64.0 in | Wt 153.4 lb

## 2012-03-18 DIAGNOSIS — Z5111 Encounter for antineoplastic chemotherapy: Secondary | ICD-10-CM | POA: Diagnosis not present

## 2012-03-18 DIAGNOSIS — C7931 Secondary malignant neoplasm of brain: Secondary | ICD-10-CM

## 2012-03-18 DIAGNOSIS — C3490 Malignant neoplasm of unspecified part of unspecified bronchus or lung: Secondary | ICD-10-CM

## 2012-03-18 DIAGNOSIS — C3410 Malignant neoplasm of upper lobe, unspecified bronchus or lung: Secondary | ICD-10-CM | POA: Diagnosis not present

## 2012-03-18 DIAGNOSIS — C7A090 Malignant carcinoid tumor of the bronchus and lung: Secondary | ICD-10-CM

## 2012-03-18 DIAGNOSIS — C7949 Secondary malignant neoplasm of other parts of nervous system: Secondary | ICD-10-CM | POA: Diagnosis not present

## 2012-03-18 LAB — COMPREHENSIVE METABOLIC PANEL (CC13)
AST: 17 U/L (ref 5–34)
Albumin: 3.6 g/dL (ref 3.5–5.0)
BUN: 14.1 mg/dL (ref 7.0–26.0)
Calcium: 9.7 mg/dL (ref 8.4–10.4)
Chloride: 107 mEq/L (ref 98–107)
Glucose: 103 mg/dl — ABNORMAL HIGH (ref 70–99)
Potassium: 4 mEq/L (ref 3.5–5.1)
Total Protein: 7.3 g/dL (ref 6.4–8.3)

## 2012-03-18 LAB — CBC WITH DIFFERENTIAL/PLATELET
Basophils Absolute: 0.1 10*3/uL (ref 0.0–0.1)
Eosinophils Absolute: 0 10*3/uL (ref 0.0–0.5)
HGB: 12.4 g/dL (ref 11.6–15.9)
NEUT#: 5.1 10*3/uL (ref 1.5–6.5)
RDW: 15.3 % — ABNORMAL HIGH (ref 11.2–14.5)
WBC: 8.6 10*3/uL (ref 3.9–10.3)
lymph#: 2.5 10*3/uL (ref 0.9–3.3)

## 2012-03-18 MED ORDER — DEXAMETHASONE SODIUM PHOSPHATE 10 MG/ML IJ SOLN
10.0000 mg | Freq: Once | INTRAMUSCULAR | Status: AC
  Administered 2012-03-18: 10 mg via INTRAVENOUS

## 2012-03-18 MED ORDER — HEPARIN SOD (PORK) LOCK FLUSH 100 UNIT/ML IV SOLN
500.0000 [IU] | Freq: Once | INTRAVENOUS | Status: AC | PRN
  Administered 2012-03-18: 500 [IU]
  Filled 2012-03-18: qty 5

## 2012-03-18 MED ORDER — ONDANSETRON 8 MG/50ML IVPB (CHCC)
8.0000 mg | Freq: Once | INTRAVENOUS | Status: AC
  Administered 2012-03-18: 8 mg via INTRAVENOUS

## 2012-03-18 MED ORDER — SODIUM CHLORIDE 0.9 % IV SOLN
500.0000 mg/m2 | Freq: Once | INTRAVENOUS | Status: AC
  Administered 2012-03-18: 900 mg via INTRAVENOUS
  Filled 2012-03-18: qty 36

## 2012-03-18 MED ORDER — SODIUM CHLORIDE 0.9 % IV SOLN
Freq: Once | INTRAVENOUS | Status: AC
  Administered 2012-03-18: 11:00:00 via INTRAVENOUS

## 2012-03-18 MED ORDER — SODIUM CHLORIDE 0.9 % IJ SOLN
10.0000 mL | INTRAMUSCULAR | Status: DC | PRN
  Administered 2012-03-18: 10 mL
  Filled 2012-03-18: qty 10

## 2012-03-18 NOTE — Progress Notes (Signed)
[facility] [phone]   Fax:(336) [phone]  OFFICE PROGRESS NOTE  [name], MD [address] [place] [address]  Diagnosis:  Danielle [name] is a 62 y.o. Caucasian female with metastatic non-small cell lung cancer initially diagnosed with locally advanced stage IIIB with right Pancoast tumor involving the vertebral body as well as the foraminal canal invasion with spinal cord compression in October of 2007.   Prior Therapy:  1. Status post concurrent chemoradiation with weekly carboplatin and paclitaxel, last dose was given November 18, 2005. 2. 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 3. Status post gamma knife radiotherapy to a solitary brain lesion located in the superior frontal area of the brain at [facility] in April of 2008. 4. Status post palliative radiotherapy to the lateral abdominal wall metastatic lesion under the care of Dr. Mitzi Hansen, completed March of 2009. 5. Status post 6 cycles of systemic chemotherapy with carboplatin and Alimta. Last dose was given July 26, 2007 with disease stabilization.  Current Therapy:  Maintenance chemotherapy with Alimta 500 mg per meter squared given every 3 weeks. The patient is status post 78 cycles.  INTERVAL HISTORY: Danielle [name] 62 y.o. female returns to the clinic today for followup visit accompanied her sister. The patient is feeling fine today with no specific complaints. She denied having any significant weight loss or night sweats. She has no chest pain, shortness breath, cough or hemoptysis. The patient is tolerating her treatment with maintenance Alimta fairly well with no significant adverse effects. She had repeat CT scan of the chest, abdomen and pelvis performed recently and she is here for evaluation and discussion of her scan results.  MEDICAL HISTORY: Past Medical  History  Diagnosis Date  . Hypertension   . Anemia   . H/O: pneumonia   . History of tobacco abuse quit 10/08    on nicotine patch  . Fibromyalgia   . Hypokalemia   . DJD (degenerative joint disease), cervical   . Thrush 12/11/2010  . 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 had chemo and radiation for lung ca    ALLERGIES:  is allergic to contrast media; co-trimoxazole injection; iohexol; and sulfa drugs cross reactors.  MEDICATIONS:  Current Outpatient Prescriptions  Medication Sig Dispense Refill  . ALPRAZolam (XANAX) 0.25 MG tablet Take 1 tablet (0.25 mg total) by mouth at bedtime as needed.  30 tablet  0  . amLODipine (NORVASC) 10 MG tablet Take 1 tablet (10 mg total) by mouth daily.  90 tablet  3  . fluconazole (DIFLUCAN) 100 MG tablet Take 2 tablets by mouth on day one, then take 1 tablet by mouth daily until completed  16 tablet  0  . folic acid (FOLVITE) 1 MG tablet Take 1 tablet (1 mg total) by mouth daily.  90 tablet  3  . lidocaine-prilocaine (EMLA) cream Apply topically as needed.  30 g  0  . lisinopril (PRINIVIL,ZESTRIL) 5 MG tablet TAKE 1 TABLET BY MOUTH EVERY DAY  90 tablet  0  . meclizine (ANTIVERT) 12.5 MG tablet Take 1-2 tablets (12.5-25 mg total) by mouth 3 (three) times daily as needed.  30 tablet  0  . morphine (MS CONTIN) 60 MG 12 hr tablet Take 1 tablet (60 mg total) by mouth 2 (two) times daily.  60 tablet  0  .  morphine (MSIR) 30 MG tablet Take 1 tablet (30 mg total) by mouth every 4 (four) hours as needed. Take 1 tab every 4-6 hours as needed for breakthrough pain  60 tablet  0  . ondansetron (ZOFRAN) 8 MG tablet Take 1 tablet (8 mg total) by mouth every 8 (eight) hours as needed for nausea.  30 tablet  2  . PATANOL 0.1 % ophthalmic solution PUT 1 DROP IN AFFECTED EYE(S) 2X A DAY  5 mL  1  . antipyrine-benzocaine (AURALGAN) otic solution Place 1 drop into both ears daily.      . predniSONE (DELTASONE) 50 MG tablet 13 hrs, 7 hrs,  and 1 hr before scheduled CT scans      . simvastatin (ZOCOR) 40 MG tablet 1 tablet daily.       No current facility-administered medications for this visit.    SURGICAL HISTORY:  Past Surgical History  Procedure Laterality Date  . Cervical laminectomy  1995  . Other surgical history  2008    Gamma knife surgery to remove brain met   . 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 Knee surgery  1990    Left x 2  . Portacath placement  -[name]    TIP IN CAVOATRIAL JUNCTION    REVIEW OF SYSTEMS:  A comprehensive review of systems was negative.   PHYSICAL EXAMINATION: General appearance: alert, cooperative and no distress Head: Normocephalic, without obvious abnormality, atraumatic Neck: no adenopathy Lymph nodes: Cervical, supraclavicular, and axillary nodes normal. Resp: clear to auscultation bilaterally Cardio: regular rate and rhythm, S1, S2 normal, no murmur, click, rub or gallop GI: soft, non-tender; bowel sounds normal; no masses,  no organomegaly Extremities: extremities normal, atraumatic, no cyanosis or edema Neurologic: Alert and oriented X 3, normal strength and tone. Normal symmetric reflexes. Normal coordination and gait  ECOG PERFORMANCE STATUS: 1 - Symptomatic but completely ambulatory  Blood pressure 138/64, pulse 73, temperature 97.9 F (36.6 C), temperature source Oral, resp. rate 20, height 5\' 4"  (1.626 m), weight 153 lb 6.4 oz (69.582 kg).  LABORATORY DATA: Lab Results  Component Value Date   WBC 8.6 03/18/2012   HGB 12.4 03/18/2012   HCT 37.7 03/18/2012   MCV 89.3 03/18/2012   PLT 413* 03/18/2012      Chemistry      Component Value Date/Time   NA 141 02/04/2012 1012   NA 135 08/20/2011 0953   K 3.4* 02/04/2012 1012   K 4.4 08/20/2011 0953   CL 106 02/04/2012 1012   CL 105 08/20/2011 0953   CO2 25 02/04/2012 1012   CO2 28 08/20/2011 0953   BUN 6.9* 02/04/2012 1012   BUN 13 08/20/2011 0953   CREATININE 0.8 02/04/2012 1012    CREATININE 0.80 08/20/2011 0953      Component Value Date/Time   CALCIUM 9.8 02/04/2012 1012   CALCIUM 9.4 08/20/2011 0953   ALKPHOS 85 02/04/2012 1012   ALKPHOS 73 08/20/2011 0953   AST 18 02/04/2012 1012   AST 16 08/20/2011 0953   ALT 13 02/04/2012 1012   ALT 9 08/20/2011 0953   BILITOT 0.41 02/04/2012 1012   BILITOT 0.5 08/20/2011 0953       RADIOGRAPHIC STUDIES: Ct Chest W Contrast  03/16/2012  *RADIOLOGY REPORT*  Clinical Data:  Lung cancer diagnosed 2007.  Ongoing chemotherapy.  CT CHEST, ABDOMEN AND PELVIS WITH CONTRAST  Technique:  Multidetector CT imaging of the chest, abdomen and pelvis was  performed following the standard protocol during bolus administration of intravenous contrast.  Contrast:  100 ml Omnipaque 300  Comparison:  CT 02/06/2012   CT CHEST  Findings:  Port in the right anterior chest wall.  No axillary or supraclavicular lymphadenopathy.  No mediastinal or hilar lymphadenopathy.  No pericardial fluid.  Esophagus is normal.  No mediastinal lymphadenopathy.  Review of the lung windows demonstrates some peripheral scarring in the right upper lobe. Extensive central lobular emphysema.  Nodular thickening in the posterior right upper lobe measuring 22 x 16 mm unchanged from 22 x 13 mm on to 01/18/2011.  Anterior thickening measures 7 mm unchanged.  No new pulmonary nodules.  IMPRESSION:  1.  No evidence of lung cancer recurrence. 2.  Stable nodular pleural thickening in the right upper lobe. 3.  Emphysematous change.   CT ABDOMEN AND PELVIS  Findings:  No focal hepatic lesion.  The gallbladder, pancreas, spleen, adrenal glands, and kidneys are normal.  The stomach, small bowel, appendix, and colon are normal.  Abdominal aorta normal caliber.  No retroperitoneal periportal lymphadenopathy.  No peritoneal disease.  No free fluid the pelvis.   No pelvic lymphadenopathy.  Uterus and ovaries are normal.  Bladder is normal. Review of  bone windows demonstrates no aggressive osseous lesions.  Kyphoplasty noted in the spine. Chronic compression fracture T3.  IMPRESSION:  No evidence of metastasis in the abdomen or pelvis.   Original Report Authenticated By: Genevive Bi, M.D.    ASSESSMENT: This is a very pleasant 62 years old white female with metastatic non-small cell lung cancer currently on maintenance chemotherapy with Alimta status post 78 cycles. The patient is tolerating her treatment fairly well and she has no evidence for disease progression.   PLAN: I discussed the scan results with the patient and her sister. I recommended for her to continue treatment with single agent Alimta as scheduled. She would come back for followup visit in 3 weeks with the next cycle of her treatment.  She was advised to call immediately if she has any concerning symptoms in the interval.  All questions were answered. The patient knows to call the clinic with any problems, questions or concerns. We can certainly see the patient much sooner if necessary.  I spent 15 minutes counseling the patient face to face. The total time spent in the appointment was 25 minutes.

## 2012-03-18 NOTE — Patient Instructions (Addendum)
[facility] Discharge Instructions for Patients Receiving Chemotherapy  Today you received the following chemotherapy agents alimta  To help prevent nausea and vomiting after your treatment, we encourage you to take your nausea medication  and take it as often as prescribed    If you develop nausea and vomiting that is not controlled by your nausea medication, call the clinic. If it is after clinic hours your family physician or the after hours number for the clinic or go to the Emergency Department.   BELOW ARE SYMPTOMS THAT SHOULD BE REPORTED IMMEDIATELY:  *FEVER GREATER THAN 100.5 F  *CHILLS WITH OR WITHOUT FEVER  NAUSEA AND VOMITING THAT IS NOT CONTROLLED WITH YOUR NAUSEA MEDICATION  *UNUSUAL SHORTNESS OF BREATH  *UNUSUAL BRUISING OR BLEEDING  TENDERNESS IN MOUTH AND THROAT WITH OR WITHOUT PRESENCE OF ULCERS  *URINARY PROBLEMS  *BOWEL PROBLEMS  UNUSUAL RASH Items with * indicate a potential emergency and should be followed up as soon as possible.  One of the nurses will contact you 24 hours after your treatment. Please let the nurse know about any problems that you may have experienced. Feel free to call the clinic you have any questions or concerns. The clinic phone number is [phone].   I have been informed and understand all the instructions given to me. I know to contact the clinic, my physician, or go to the Emergency Department if any problems should occur. I do not have any questions at this time, but understand that I may call the clinic during office hours   should I have any questions or need assistance in obtaining follow up care.    __________________________________________  _____________  __________ Signature of Patient or Authorized Representative            Date                   Time    __________________________________________ Nurse's Signature    

## 2012-03-18 NOTE — Patient Instructions (Signed)
No evidence for disease progression on his recent scan. Followup visit in 3 weeks with the next cycle of chemotherapy.

## 2012-03-18 NOTE — Telephone Encounter (Signed)
Scheduled pt for lab and ML, for April 2014 waiting for chemo to be scheduled

## 2012-03-18 NOTE — Telephone Encounter (Signed)
Gave pt appt for lab, ML and chemo for April 2014 °

## 2012-04-07 ENCOUNTER — Ambulatory Visit (HOSPITAL_BASED_OUTPATIENT_CLINIC_OR_DEPARTMENT_OTHER): Payer: Medicare Other | Admitting: Physician Assistant

## 2012-04-07 ENCOUNTER — Ambulatory Visit (HOSPITAL_BASED_OUTPATIENT_CLINIC_OR_DEPARTMENT_OTHER): Payer: Medicare Other

## 2012-04-07 ENCOUNTER — Other Ambulatory Visit (HOSPITAL_BASED_OUTPATIENT_CLINIC_OR_DEPARTMENT_OTHER): Payer: Medicare Other | Admitting: Lab

## 2012-04-07 ENCOUNTER — Encounter: Payer: Self-pay | Admitting: Physician Assistant

## 2012-04-07 ENCOUNTER — Telehealth: Payer: Self-pay | Admitting: Internal Medicine

## 2012-04-07 ENCOUNTER — Telehealth: Payer: Self-pay | Admitting: *Deleted

## 2012-04-07 DIAGNOSIS — C7931 Secondary malignant neoplasm of brain: Secondary | ICD-10-CM

## 2012-04-07 DIAGNOSIS — C3410 Malignant neoplasm of upper lobe, unspecified bronchus or lung: Secondary | ICD-10-CM

## 2012-04-07 DIAGNOSIS — C3490 Malignant neoplasm of unspecified part of unspecified bronchus or lung: Secondary | ICD-10-CM

## 2012-04-07 DIAGNOSIS — Z5111 Encounter for antineoplastic chemotherapy: Secondary | ICD-10-CM

## 2012-04-07 DIAGNOSIS — C7949 Secondary malignant neoplasm of other parts of nervous system: Secondary | ICD-10-CM

## 2012-04-07 LAB — COMPREHENSIVE METABOLIC PANEL (CC13)
ALT: 19 U/L (ref 0–55)
AST: 19 U/L (ref 5–34)
Albumin: 3.2 g/dL — ABNORMAL LOW (ref 3.5–5.0)
Alkaline Phosphatase: 88 U/L (ref 40–150)
BUN: 10.5 mg/dL (ref 7.0–26.0)
Calcium: 9.6 mg/dL (ref 8.4–10.4)
Chloride: 106 mEq/L (ref 98–107)
Potassium: 4.7 mEq/L (ref 3.5–5.1)
Sodium: 141 mEq/L (ref 136–145)
Total Protein: 6.9 g/dL (ref 6.4–8.3)

## 2012-04-07 LAB — CBC WITH DIFFERENTIAL/PLATELET
BASO%: 0.4 % (ref 0.0–2.0)
Basophils Absolute: 0 10*3/uL (ref 0.0–0.1)
EOS%: 1 % (ref 0.0–7.0)
HGB: 12.6 g/dL (ref 11.6–15.9)
MCH: 29.6 pg (ref 25.1–34.0)
NEUT#: 4.3 10*3/uL (ref 1.5–6.5)
RBC: 4.27 10*6/uL (ref 3.70–5.45)
RDW: 14.9 % — ABNORMAL HIGH (ref 11.2–14.5)
lymph#: 2.2 10*3/uL (ref 0.9–3.3)

## 2012-04-07 MED ORDER — MORPHINE SULFATE ER 60 MG PO TBCR: 60.0000 mg | EXTENDED_RELEASE_TABLET | Freq: Two times a day (BID) | ORAL | Status: DC

## 2012-04-07 MED ORDER — ONDANSETRON HCL 8 MG PO TABS: 8.0000 mg | ORAL_TABLET | Freq: Three times a day (TID) | ORAL | Status: DC | PRN

## 2012-04-07 MED ORDER — HEPARIN SOD (PORK) LOCK FLUSH 100 UNIT/ML IV SOLN
500.0000 [IU] | Freq: Once | INTRAVENOUS | Status: AC | PRN
  Administered 2012-04-07: 500 [IU]
  Filled 2012-04-07: qty 5

## 2012-04-07 MED ORDER — FLUCONAZOLE 100 MG PO TABS: ORAL_TABLET | ORAL | Status: DC

## 2012-04-07 MED ORDER — SODIUM CHLORIDE 0.9 % IV SOLN
500.0000 mg/m2 | Freq: Once | INTRAVENOUS | Status: AC
  Administered 2012-04-07: 900 mg via INTRAVENOUS
  Filled 2012-04-07: qty 36

## 2012-04-07 MED ORDER — SODIUM CHLORIDE 0.9 % IV SOLN
Freq: Once | INTRAVENOUS | Status: AC
  Administered 2012-04-07: 11:00:00 via INTRAVENOUS

## 2012-04-07 MED ORDER — SODIUM CHLORIDE 0.9 % IJ SOLN
10.0000 mL | INTRAMUSCULAR | Status: DC | PRN
  Administered 2012-04-07: 10 mL
  Filled 2012-04-07: qty 10

## 2012-04-07 MED ORDER — MORPHINE SULFATE 30 MG PO TABS: 30.0000 mg | ORAL_TABLET | ORAL | Status: DC | PRN

## 2012-04-07 MED ORDER — ONDANSETRON 8 MG/50ML IVPB (CHCC)
8.0000 mg | Freq: Once | INTRAVENOUS | Status: AC
  Administered 2012-04-07: 8 mg via INTRAVENOUS

## 2012-04-07 MED ORDER — ALPRAZOLAM 0.25 MG PO TABS: 0.2500 mg | ORAL_TABLET | Freq: Every evening | ORAL | Status: DC | PRN

## 2012-04-07 MED ORDER — DEXAMETHASONE SODIUM PHOSPHATE 10 MG/ML IJ SOLN
10.0000 mg | Freq: Once | INTRAMUSCULAR | Status: AC
  Administered 2012-04-07: 10 mg via INTRAVENOUS

## 2012-04-07 NOTE — Telephone Encounter (Signed)
Per staff message and POF I have scheduled appts.  JMW  

## 2012-04-07 NOTE — Patient Instructions (Addendum)
Take Diflucan as prescribed for your oral thrush Followup in 3 weeks prior to next scheduled cycle of maintenance Alimta

## 2012-04-07 NOTE — Patient Instructions (Signed)
[facility] Discharge Instructions for Patients Receiving Chemotherapy  Today you received the following chemotherapy agents Alimta To help prevent nausea and vomiting after your treatment, we encourage you to take your nausea medication as prescribed.  If you develop nausea and vomiting that is not controlled by your nausea medication, call the clinic. If it is after clinic hours your family physician or the after hours number for the clinic or go to the Emergency Department.   BELOW ARE SYMPTOMS THAT SHOULD BE REPORTED IMMEDIATELY:  *FEVER GREATER THAN 100.5 F  *CHILLS WITH OR WITHOUT FEVER  NAUSEA AND VOMITING THAT IS NOT CONTROLLED WITH YOUR NAUSEA MEDICATION  *UNUSUAL SHORTNESS OF BREATH  *UNUSUAL BRUISING OR BLEEDING  TENDERNESS IN MOUTH AND THROAT WITH OR WITHOUT PRESENCE OF ULCERS  *URINARY PROBLEMS  *BOWEL PROBLEMS  UNUSUAL RASH Items with * indicate a potential emergency and should be followed up as soon as possible.  One of the nurses will contact you 24 hours after your treatment. Please let the nurse know about any problems that you may have experienced. Feel free to call the clinic you have any questions or concerns. The clinic phone number is [phone].   I have been informed and understand all the instructions given to me. I know to contact the clinic, my physician, or go to the Emergency Department if any problems should occur. I do not have any questions at this time, but understand that I may call the clinic during office hours   should I have any questions or need assistance in obtaining follow up care.    __________________________________________  _____________  __________ Signature of Patient or Authorized Representative            Date                   Time    __________________________________________ Nurse's Signature    

## 2012-04-07 NOTE — Progress Notes (Signed)
[facility] Telephone:(336) [phone]   Fax:([phone]  OFFICE PROGRESS NOTE  [name], MD [address] [place] [address]  Diagnosis:  MABLE DARA is a 62 y.o. Caucasian female with metastatic non-small cell lung cancer initially diagnosed with locally advanced stage IIIB with right Pancoast tumor involving the vertebral body as well as the foraminal canal invasion with spinal cord compression in October of 2007.   Prior Therapy:  1. Status post concurrent chemoradiation with weekly carboplatin and paclitaxel, last dose was given November 18, 2005. 2. 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 3. Status post gamma knife radiotherapy to a solitary brain lesion located in the superior frontal area of the brain at [facility] in April of 2008. 4. Status post palliative radiotherapy to the lateral abdominal wall metastatic lesion under the care of Dr. Mitzi Hansen, completed March of 2009. 5. Status post 6 cycles of systemic chemotherapy with carboplatin and Alimta. Last dose was given July 26, 2007 with disease stabilization.  Current Therapy:  Maintenance chemotherapy with Alimta 500 mg per meter squared given every 3 weeks. The patient is status post 79 cycles.  INTERVAL HISTORY: Eileene Kisling Tagle 62 y.o. female returns to the clinic today for followup visit.  The patient is feeling fine today with no specific complaints. She denied having any significant weight loss or night sweats. She has no chest pain, shortness breath, cough or hemoptysis. The patient is tolerating her treatment with maintenance Alimta fairly well with no significant adverse effects. She requests refills for her MS Contin, MSIR, Xanax and Zofran.   MEDICAL HISTORY: Past Medical History  Diagnosis Date  . Hypertension   . Anemia   . H/O: pneumonia   . History of tobacco abuse  quit 10/08    on nicotine patch  . Fibromyalgia   . Hypokalemia   . DJD (degenerative joint disease), cervical   . Thrush 12/11/2010  . 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 had chemo and radiation for lung ca    ALLERGIES:  is allergic to contrast media; co-trimoxazole injection; iohexol; and sulfa drugs cross reactors.  MEDICATIONS:  Current Outpatient Prescriptions  Medication Sig Dispense Refill  . ALPRAZolam (XANAX) 0.25 MG tablet Take 1 tablet (0.25 mg total) by mouth at bedtime as needed.  30 tablet  0  . amLODipine (NORVASC) 10 MG tablet Take 1 tablet (10 mg total) by mouth daily.  90 tablet  3  . antipyrine-benzocaine (AURALGAN) otic solution Place 1 drop into both ears daily.      . fluconazole (DIFLUCAN) 100 MG tablet Take 2 tablets by mouth on day one, then take 1 tablet by mouth daily until completed  16 tablet  0  . folic acid (FOLVITE) 1 MG tablet Take 1 tablet (1 mg total) by mouth daily.  90 tablet  3  . lidocaine-prilocaine (EMLA) cream Apply topically as needed.  30 g  0  . lisinopril (PRINIVIL,ZESTRIL) 5 MG tablet TAKE 1 TABLET BY MOUTH EVERY DAY  90 tablet  0  . meclizine (ANTIVERT) 12.5 MG tablet Take 1-2 tablets (12.5-25 mg total) by mouth 3 (three) times daily as needed.  30 tablet  0  . morphine (MS CONTIN) 60 MG 12 hr tablet Take 1 tablet (60 mg total) by mouth 2 (two) times daily.  60 tablet  0  .  morphine (MSIR) 30 MG tablet Take 1 tablet (30 mg total) by mouth every 4 (four) hours as needed. Take 1 tab every 4-6 hours as needed for breakthrough pain  60 tablet  0  . ondansetron (ZOFRAN) 8 MG tablet Take 1 tablet (8 mg total) by mouth every 8 (eight) hours as needed for nausea.  30 tablet  2  . PATANOL 0.1 % ophthalmic solution PUT 1 DROP IN AFFECTED EYE(S) 2X A DAY  5 mL  1  . predniSONE (DELTASONE) 50 MG tablet 13 hrs, 7 hrs, and 1 hr before scheduled CT scans       No current facility-administered medications for this visit.    Facility-Administered Medications Ordered in Other Visits  Medication Dose Route Frequency Provider Last Rate Last Dose  . sodium chloride 0.9 % injection 10 mL  10 mL Intracatheter PRN Si Gaul, MD   10 mL at 04/07/12 1200    SURGICAL HISTORY:  Past Surgical History  Procedure Laterality Date  . Cervical laminectomy  1995  . Other surgical history  2008    Gamma knife surgery to remove brain met   . 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 Knee surgery  1990    Left x 2  . Portacath placement  -[name]    TIP IN CAVOATRIAL JUNCTION    REVIEW OF SYSTEMS:  A comprehensive review of systems was negative.   PHYSICAL EXAMINATION: General appearance: alert, cooperative and no distress Head: Normocephalic, without obvious abnormality, atraumatic Neck: no adenopathy Lymph nodes: Cervical, supraclavicular, and axillary nodes normal. Resp: clear to auscultation bilaterally Cardio: regular rate and rhythm, S1, S2 normal, no murmur, click, rub or gallop GI: soft, non-tender; bowel sounds normal; no masses,  no organomegaly Extremities: extremities normal, atraumatic, no cyanosis or edema Neurologic: Alert and oriented X 3, normal strength and tone. Normal symmetric reflexes. Normal coordination and gait Oropharynx reveals mild thrush  ECOG PERFORMANCE STATUS: 1 - Symptomatic but completely ambulatory  Blood pressure 123/64, pulse 78, temperature 97.8 F (36.6 C), temperature source Oral, resp. rate 18, height 5\' 4"  (1.626 m), weight 156 lb 14.4 oz (71.169 kg).  LABORATORY DATA: Lab Results  Component Value Date   WBC 7.6 04/07/2012   HGB 12.6 04/07/2012   HCT 37.8 04/07/2012   MCV 88.7 04/07/2012   PLT 321 04/07/2012      Chemistry      Component Value Date/Time   NA 141 04/07/2012 1008   NA 135 08/20/2011 0953   K 4.7 04/07/2012 1008   K 4.4 08/20/2011 0953   CL 106 04/07/2012 1008   CL 105 08/20/2011 0953   CO2 26 04/07/2012 1008   CO2 28 08/20/2011  0953   BUN 10.5 04/07/2012 1008   BUN 13 08/20/2011 0953   CREATININE 0.7 04/07/2012 1008   CREATININE 0.80 08/20/2011 0953      Component Value Date/Time   CALCIUM 9.6 04/07/2012 1008   CALCIUM 9.4 08/20/2011 0953   ALKPHOS 88 04/07/2012 1008   ALKPHOS 73 08/20/2011 0953   AST 19 04/07/2012 1008   AST 16 08/20/2011 0953   ALT 19 04/07/2012 1008   ALT 9 08/20/2011 0953   BILITOT 0.33 04/07/2012 1008   BILITOT 0.5 08/20/2011 0953       RADIOGRAPHIC STUDIES: Ct Chest W Contrast  03/16/2012  *RADIOLOGY REPORT*  Clinical Data:  Lung cancer diagnosed 2007.  Ongoing chemotherapy.  CT CHEST, ABDOMEN AND PELVIS  WITH CONTRAST  Technique:  Multidetector CT imaging of the chest, abdomen and pelvis was performed following the standard protocol during bolus administration of intravenous contrast.  Contrast:  100 ml Omnipaque 300  Comparison:  CT 02/06/2012   CT CHEST  Findings:  Port in the right anterior chest wall.  No axillary or supraclavicular lymphadenopathy.  No mediastinal or hilar lymphadenopathy.  No pericardial fluid.  Esophagus is normal.  No mediastinal lymphadenopathy.  Review of the lung windows demonstrates some peripheral scarring in the right upper lobe. Extensive central lobular emphysema.  Nodular thickening in the posterior right upper lobe measuring 22 x 16 mm unchanged from 22 x 13 mm on to 01/18/2011.  Anterior thickening measures 7 mm unchanged.  No new pulmonary nodules.  IMPRESSION:  1.  No evidence of lung cancer recurrence. 2.  Stable nodular pleural thickening in the right upper lobe. 3.  Emphysematous change.   CT ABDOMEN AND PELVIS  Findings:  No focal hepatic lesion.  The gallbladder, pancreas, spleen, adrenal glands, and kidneys are normal.  The stomach, small bowel, appendix, and colon are normal.  Abdominal aorta normal caliber.  No retroperitoneal periportal lymphadenopathy.  No peritoneal disease.  No free fluid the pelvis.   No pelvic lymphadenopathy.  Uterus and ovaries are normal.   Bladder is normal. Review of  bone windows demonstrates no aggressive osseous lesions. Kyphoplasty noted in the spine. Chronic compression fracture T3.  IMPRESSION:  No evidence of metastasis in the abdomen or pelvis.   Original Report Authenticated By: Genevive Bi, M.D.    ASSESSMENT/PLAN: This is a very pleasant 62 years old white female with metastatic non-small cell lung cancer currently on maintenance chemotherapy with Alimta status post 79 cycles. The patient is tolerating her treatment fairly well and she has no evidence for disease progression. Patient was discussed with Dr. Arbutus Ped. She will proceed with cycle #80 of her maintenance chemotherapy with single agent Alimta. A prescription for Diflucan was sent to her pharmacy of record via E. scribed to address her oral candidiasis as well as a refill for her Zofran. She was given prescription refills for her MS Contin, MSIR and Xanax. She'll followup in 3 weeks prior to her next scheduled cycle of maintenance chemotherapy which will be cycle #81  Thomasena Vandenheuvel E, PA-C   She was advised to call immediately if she has any concerning symptoms in the interval.  All questions were answered. The patient knows to call the clinic with any problems, questions or concerns. We can certainly see the patient much sooner if necessary.  I spent 20 minutes counseling the patient face to face. The total time spent in the appointment was 30 minutes.

## 2012-04-27 ENCOUNTER — Other Ambulatory Visit: Payer: Self-pay | Admitting: *Deleted

## 2012-04-27 DIAGNOSIS — C801 Malignant (primary) neoplasm, unspecified: Secondary | ICD-10-CM | POA: Diagnosis not present

## 2012-04-27 DIAGNOSIS — Z09 Encounter for follow-up examination after completed treatment for conditions other than malignant neoplasm: Secondary | ICD-10-CM | POA: Diagnosis not present

## 2012-04-27 DIAGNOSIS — Z8669 Personal history of other diseases of the nervous system and sense organs: Secondary | ICD-10-CM | POA: Diagnosis not present

## 2012-04-27 DIAGNOSIS — C7949 Secondary malignant neoplasm of other parts of nervous system: Secondary | ICD-10-CM | POA: Diagnosis not present

## 2012-04-27 DIAGNOSIS — C7931 Secondary malignant neoplasm of brain: Secondary | ICD-10-CM | POA: Diagnosis not present

## 2012-04-27 DIAGNOSIS — C3490 Malignant neoplasm of unspecified part of unspecified bronchus or lung: Secondary | ICD-10-CM | POA: Diagnosis not present

## 2012-04-28 ENCOUNTER — Ambulatory Visit (HOSPITAL_BASED_OUTPATIENT_CLINIC_OR_DEPARTMENT_OTHER): Payer: Medicare Other | Admitting: Physician Assistant

## 2012-04-28 ENCOUNTER — Other Ambulatory Visit (HOSPITAL_BASED_OUTPATIENT_CLINIC_OR_DEPARTMENT_OTHER): Payer: Medicare Other | Admitting: Lab

## 2012-04-28 ENCOUNTER — Other Ambulatory Visit: Payer: Medicare Other | Admitting: Lab

## 2012-04-28 ENCOUNTER — Ambulatory Visit (HOSPITAL_BASED_OUTPATIENT_CLINIC_OR_DEPARTMENT_OTHER): Payer: Medicare Other

## 2012-04-28 ENCOUNTER — Telehealth: Payer: Self-pay | Admitting: *Deleted

## 2012-04-28 ENCOUNTER — Telehealth: Payer: Self-pay | Admitting: Internal Medicine

## 2012-04-28 DIAGNOSIS — C3410 Malignant neoplasm of upper lobe, unspecified bronchus or lung: Secondary | ICD-10-CM

## 2012-04-28 DIAGNOSIS — C7931 Secondary malignant neoplasm of brain: Secondary | ICD-10-CM

## 2012-04-28 DIAGNOSIS — C7949 Secondary malignant neoplasm of other parts of nervous system: Secondary | ICD-10-CM

## 2012-04-28 DIAGNOSIS — C3490 Malignant neoplasm of unspecified part of unspecified bronchus or lung: Secondary | ICD-10-CM

## 2012-04-28 DIAGNOSIS — Z5111 Encounter for antineoplastic chemotherapy: Secondary | ICD-10-CM | POA: Diagnosis not present

## 2012-04-28 LAB — CBC WITH DIFFERENTIAL/PLATELET
BASO%: 0.2 % (ref 0.0–2.0)
EOS%: 0.4 % (ref 0.0–7.0)
MCH: 29.1 pg (ref 25.1–34.0)
MCHC: 32.6 g/dL (ref 31.5–36.0)
MCV: 89.2 fL (ref 79.5–101.0)
MONO%: 13.1 % (ref 0.0–14.0)
RBC: 4.2 10*6/uL (ref 3.70–5.45)
RDW: 14.2 % (ref 11.2–14.5)
lymph#: 1.5 10*3/uL (ref 0.9–3.3)

## 2012-04-28 LAB — COMPREHENSIVE METABOLIC PANEL (CC13)
ALT: 9 U/L (ref 0–55)
AST: 14 U/L (ref 5–34)
Albumin: 3.2 g/dL — ABNORMAL LOW (ref 3.5–5.0)
Alkaline Phosphatase: 89 U/L (ref 40–150)
BUN: 8.8 mg/dL (ref 7.0–26.0)
Calcium: 9.6 mg/dL (ref 8.4–10.4)
Chloride: 104 mEq/L (ref 98–107)
Potassium: 4 mEq/L (ref 3.5–5.1)

## 2012-04-28 MED ORDER — SODIUM CHLORIDE 0.9 % IJ SOLN
10.0000 mL | INTRAMUSCULAR | Status: DC | PRN
  Administered 2012-04-28: 10 mL
  Filled 2012-04-28: qty 10

## 2012-04-28 MED ORDER — AMOXICILLIN-POT CLAVULANATE 875-125 MG PO TABS: 1.0000 | ORAL_TABLET | Freq: Two times a day (BID) | ORAL | Status: DC

## 2012-04-28 MED ORDER — SODIUM CHLORIDE 0.9 % IV SOLN
500.0000 mg/m2 | Freq: Once | INTRAVENOUS | Status: AC
  Administered 2012-04-28: 900 mg via INTRAVENOUS
  Filled 2012-04-28: qty 36

## 2012-04-28 MED ORDER — CYANOCOBALAMIN 1000 MCG/ML IJ SOLN
1000.0000 ug | Freq: Once | INTRAMUSCULAR | Status: AC
  Administered 2012-04-28: 1000 ug via INTRAMUSCULAR

## 2012-04-28 MED ORDER — SODIUM CHLORIDE 0.9 % IV SOLN
Freq: Once | INTRAVENOUS | Status: AC
  Administered 2012-04-28: 10:00:00 via INTRAVENOUS

## 2012-04-28 MED ORDER — LIDOCAINE-PRILOCAINE 2.5-2.5 % EX CREA: TOPICAL_CREAM | CUTANEOUS | Status: DC | PRN

## 2012-04-28 MED ORDER — DEXAMETHASONE SODIUM PHOSPHATE 10 MG/ML IJ SOLN
10.0000 mg | Freq: Once | INTRAMUSCULAR | Status: AC
  Administered 2012-04-28: 10 mg via INTRAVENOUS

## 2012-04-28 MED ORDER — ONDANSETRON 8 MG/50ML IVPB (CHCC)
8.0000 mg | Freq: Once | INTRAVENOUS | Status: AC
  Administered 2012-04-28: 8 mg via INTRAVENOUS

## 2012-04-28 MED ORDER — HEPARIN SOD (PORK) LOCK FLUSH 100 UNIT/ML IV SOLN
500.0000 [IU] | Freq: Once | INTRAVENOUS | Status: AC | PRN
  Administered 2012-04-28: 500 [IU]
  Filled 2012-04-28: qty 5

## 2012-04-28 NOTE — Telephone Encounter (Signed)
Per staff phone call and POF I have schedueld appts.  JMW  

## 2012-04-28 NOTE — Patient Instructions (Addendum)
Followup in 3 weeks prior to your next scheduled cycle of maintenance Alimta Followup with Dr. Johny Drilling and proceed with the scheduled gamma knife therapy to the new lesions in your brain

## 2012-04-28 NOTE — Patient Instructions (Signed)
[facility] Discharge Instructions for Patients Receiving Chemotherapy  Today you received the following chemotherapy agents :  Alimta, Vitamin B12.  To help prevent nausea and vomiting after your treatment, we encourage you to take your nausea medication as instructed by your physician.    If you develop nausea and vomiting that is not controlled by your nausea medication, call the clinic. If it is after clinic hours your family physician or the after hours number for the clinic or go to the Emergency Department.   BELOW ARE SYMPTOMS THAT SHOULD BE REPORTED IMMEDIATELY:  *FEVER GREATER THAN 100.5 F  *CHILLS WITH OR WITHOUT FEVER  NAUSEA AND VOMITING THAT IS NOT CONTROLLED WITH YOUR NAUSEA MEDICATION  *UNUSUAL SHORTNESS OF BREATH  *UNUSUAL BRUISING OR BLEEDING  TENDERNESS IN MOUTH AND THROAT WITH OR WITHOUT PRESENCE OF ULCERS  *URINARY PROBLEMS  *BOWEL PROBLEMS  UNUSUAL RASH Items with * indicate a potential emergency and should be followed up as soon as possible.  One of the nurses will contact you 24 hours after your treatment. Please let the nurse know about any problems that you may have experienced. Feel free to call the clinic you have any questions or concerns. The clinic phone number is [phone].   I have been informed and understand all the instructions given to me. I know to contact the clinic, my physician, or go to the Emergency Department if any problems should occur. I do not have any questions at this time, but understand that I may call the clinic during office hours   should I have any questions or need assistance in obtaining follow up care.    __________________________________________  _____________  __________ Signature of Patient or Authorized Representative            Date                   Time    __________________________________________ Nurse's Signature

## 2012-04-28 NOTE — Telephone Encounter (Signed)
gv and printed appt sched and avs for pt for May and June °

## 2012-05-01 NOTE — Progress Notes (Signed)
[facility] [phone]   Fax:([phone]  OFFICE PROGRESS NOTE  [name], MD [address] [place] [address]  Diagnosis:  Danielle [name] is a 62 y.o. Caucasian female with metastatic non-small cell lung cancer initially diagnosed with locally advanced stage IIIB with right Pancoast tumor involving the vertebral body as well as the foraminal canal invasion with spinal cord compression in October of 2007.   Prior Therapy:  1. Status post concurrent chemoradiation with weekly carboplatin and paclitaxel, last dose was given November 18, 2005. 2. 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 3. Status post gamma knife radiotherapy to a solitary brain lesion located in the superior frontal area of the brain at [facility] in April of 2008. 4. Status post palliative radiotherapy to the lateral abdominal wall metastatic lesion under the care of Dr. Mitzi Hansen, completed March of 2009. 5. Status post 6 cycles of systemic chemotherapy with carboplatin and Alimta. Last dose was given July 26, 2007 with disease stabilization.  Current Therapy:  Maintenance chemotherapy with Alimta 500 mg per meter squared given every 3 weeks. The patient is status post 80 cycles.  INTERVAL HISTORY: Danielle [name] 62 y.o. female returns to the clinic today for followup visit accompanied by her sister.  She reports she was recently evaluated at [facility] by Dr. Johny Drilling and was told that she had new lesions on her brain. There is also a region and the involved bone. She is to have gamma knife procedure to these lesions. She is tearful and upset and relayed this information to me. She does understand that they are small lesions and appreciates the fact that they were found early. She complains of right ear pain but denied fever or chills. She requests a refill for her EMLA cream  She denied having any significant weight loss or night sweats. She has no chest pain, shortness breath, cough or hemoptysis. The patient is tolerating her treatment with maintenance Alimta fairly well with no significant adverse effects.   MEDICAL HISTORY: Past Medical History  Diagnosis Date  . Hypertension   . Anemia   . H/O: pneumonia   . History of tobacco abuse quit 10/08    on nicotine patch  . Fibromyalgia   . Hypokalemia   . DJD (degenerative joint disease), cervical   . Thrush 12/11/2010  . 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 had chemo and radiation for lung ca    ALLERGIES:  is allergic to contrast media; co-trimoxazole injection; iohexol; and sulfa drugs cross reactors.  MEDICATIONS:  Current Outpatient Prescriptions  Medication Sig Dispense Refill  . ALPRAZolam (XANAX) 0.25 MG tablet Take 1 tablet (0.25 mg total) by mouth at bedtime as needed.  30 tablet  0  . amLODipine (NORVASC) 10 MG tablet Take 1 tablet (10 mg total) by mouth daily.  90 tablet  3  . amoxicillin-clavulanate (AUGMENTIN) 875-125 MG per tablet Take 1 tablet by mouth 2 (two) times daily.  20 tablet  0  . antipyrine-benzocaine (AURALGAN) otic solution Place 1 drop into both ears daily.      . fluconazole (DIFLUCAN) 100 MG tablet Take 2 tablets by mouth on day one, then take 1 tablet by mouth daily until completed  16 tablet  0  . folic acid (FOLVITE) 1 MG tablet Take 1 tablet (1 mg total) by  mouth daily.  90 tablet  3  . lidocaine-prilocaine (EMLA) cream Apply topically as needed.  30 g  1  . lisinopril (PRINIVIL,ZESTRIL) 5 MG tablet TAKE 1 TABLET BY MOUTH EVERY DAY  90 tablet  0  . meclizine (ANTIVERT) 12.5 MG tablet Take 1-2 tablets (12.5-25 mg total) by mouth 3 (three) times daily as needed.  30 tablet  0  . morphine (MS CONTIN) 60 MG 12 hr tablet Take 1 tablet (60 mg total) by mouth 2 (two) times daily.  60 tablet  0  . morphine (MSIR) 30 MG tablet Take 1 tablet (30 mg  total) by mouth every 4 (four) hours as needed. Take 1 tab every 4-6 hours as needed for breakthrough pain  60 tablet  0  . ondansetron (ZOFRAN) 8 MG tablet Take 1 tablet (8 mg total) by mouth every 8 (eight) hours as needed for nausea.  30 tablet  2  . PATANOL 0.1 % ophthalmic solution PUT 1 DROP IN AFFECTED EYE(S) 2X A DAY  5 mL  1  . predniSONE (DELTASONE) 50 MG tablet 13 hrs, 7 hrs, and 1 hr before scheduled CT scans       No current facility-administered medications for this visit.    SURGICAL HISTORY:  Past Surgical History  Procedure Laterality Date  . Cervical laminectomy  1995  . Other surgical history  2008    Gamma knife surgery to remove brain met   . 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 Knee surgery  1990    Left x 2  . Portacath placement  -[name]    TIP IN CAVOATRIAL JUNCTION    REVIEW OF SYSTEMS:  A comprehensive review of systems was negative except for: Ears, nose, mouth, throat, and face: positive for earaches   PHYSICAL EXAMINATION: General appearance: alert, cooperative and no distress Head: Normocephalic, without obvious abnormality, atraumatic Neck: no adenopathy Lymph nodes: Cervical, supraclavicular, and axillary nodes normal. Resp: clear to auscultation bilaterally Cardio: regular rate and rhythm, S1, S2 normal, no murmur, click, rub or gallop GI: soft, non-tender; bowel sounds normal; no masses,  no organomegaly Extremities: extremities normal, atraumatic, no cyanosis or edema Neurologic: Alert and oriented X 3, normal strength and tone. Normal symmetric reflexes. Normal coordination and gait Examination ears: Left TM retracted, right TM erythematous, dull with a small area of bulging and no purulent material is evident  Oropharynx: Clear  ECOG PERFORMANCE STATUS: 1 - Symptomatic but completely ambulatory  Blood pressure 137/71, pulse 96, temperature 98.2 F (36.8 C), temperature source Oral, resp. rate 20, height 5'  4" (1.626 m), weight 157 lb 1.6 oz (71.26 kg).  LABORATORY DATA: Lab Results  Component Value Date   WBC 7.9 04/28/2012   HGB 12.2 04/28/2012   HCT 37.5 04/28/2012   MCV 89.2 04/28/2012   PLT 327 04/28/2012      Chemistry      Component Value Date/Time   NA 139 04/28/2012 0838   NA 135 08/20/2011 0953   K 4.0 04/28/2012 0838   K 4.4 08/20/2011 0953   CL 104 04/28/2012 0838   CL 105 08/20/2011 0953   CO2 27 04/28/2012 0838   CO2 28 08/20/2011 0953   BUN 8.8 04/28/2012 0838   BUN 13 08/20/2011 0953   CREATININE 0.7 04/28/2012 0838   CREATININE 0.80 08/20/2011 0953      Component Value Date/Time   CALCIUM 9.6 04/28/2012 0838   CALCIUM 9.4  08/20/2011 0953   ALKPHOS 89 04/28/2012 0838   ALKPHOS 73 08/20/2011 0953   AST 14 04/28/2012 0838   AST 16 08/20/2011 0953   ALT 9 04/28/2012 0838   ALT 9 08/20/2011 0953   BILITOT 0.40 04/28/2012 0838   BILITOT 0.5 08/20/2011 0953       RADIOGRAPHIC STUDIES: Ct Chest W Contrast  03/16/2012  *RADIOLOGY REPORT*  Clinical Data:  Lung cancer diagnosed 2007.  Ongoing chemotherapy.  CT CHEST, ABDOMEN AND PELVIS WITH CONTRAST  Technique:  Multidetector CT imaging of the chest, abdomen and pelvis was performed following the standard protocol during bolus administration of intravenous contrast.  Contrast:  100 ml Omnipaque 300  Comparison:  CT 02/06/2012   CT CHEST  Findings:  Port in the right anterior chest wall.  No axillary or supraclavicular lymphadenopathy.  No mediastinal or hilar lymphadenopathy.  No pericardial fluid.  Esophagus is normal.  No mediastinal lymphadenopathy.  Review of the lung windows demonstrates some peripheral scarring in the right upper lobe. Extensive central lobular emphysema.  Nodular thickening in the posterior right upper lobe measuring 22 x 16 mm unchanged from 22 x 13 mm on to 01/18/2011.  Anterior thickening measures 7 mm unchanged.  No new pulmonary nodules.  IMPRESSION:  1.  No evidence of lung cancer recurrence. 2.  Stable nodular  pleural thickening in the right upper lobe. 3.  Emphysematous change.   CT ABDOMEN AND PELVIS  Findings:  No focal hepatic lesion.  The gallbladder, pancreas, spleen, adrenal glands, and kidneys are normal.  The stomach, small bowel, appendix, and colon are normal.  Abdominal aorta normal caliber.  No retroperitoneal periportal lymphadenopathy.  No peritoneal disease.  No free fluid the pelvis.   No pelvic lymphadenopathy.  Uterus and ovaries are normal.  Bladder is normal. Review of  bone windows demonstrates no aggressive osseous lesions. Kyphoplasty noted in the spine. Chronic compression fracture T3.  IMPRESSION:  No evidence of metastasis in the abdomen or pelvis.   Original Report Authenticated By: Genevive Bi, M.D.    ASSESSMENT/PLAN: This is a very pleasant 62 years old white female with metastatic non-small cell lung cancer currently on maintenance chemotherapy with Alimta status post 79 cycles. The patient is tolerating her treatment fairly well and she has no evidence for disease progression. Patient was discussed with Dr. Arbutus Ped. The MRI of her brain done at [facility] reevaluation revealed 2 new lesions, a right parietal 2-3 mm lesion in the right frontal region lesion involving the dura and perhaps bone in that area. Gamma knife treatment as planned to address these lesions. She will proceed with cycle #81 of her maintenance chemotherapy with single agent Alimta. A prescription for Augmentin was sent to her pharmacy of record via E. scribed to address her right otitis media as well as a refill prescription for her Emla cream. She is encouraged to followup with Dr. Johny Drilling regarding the gamma knife therapy. She'll followup with Dr. Arbutus Ped in 3 weeks for another symptom management visit prior to cycle #82 of her maintenance chemotherapy with single agent Alimta,  Daoud Lobue E, PA-C   She was advised to call immediately if she has any concerning symptoms in the interval.  All  questions were answered. The patient knows to call the clinic with any problems, questions or concerns. We can certainly see the patient much sooner if necessary.  I spent 20 minutes counseling the patient face to face. The total time spent in the appointment was 30  minutes.

## 2012-05-06 ENCOUNTER — Other Ambulatory Visit: Payer: Self-pay | Admitting: Medical Oncology

## 2012-05-06 MED ORDER — MORPHINE SULFATE ER 60 MG PO TBCR: 60.0000 mg | EXTENDED_RELEASE_TABLET | Freq: Two times a day (BID) | ORAL | Status: DC

## 2012-05-06 MED ORDER — MORPHINE SULFATE 30 MG PO TABS: 30.0000 mg | ORAL_TABLET | ORAL | Status: DC | PRN

## 2012-05-06 NOTE — Telephone Encounter (Signed)
rx locked in injection room. 

## 2012-05-07 DIAGNOSIS — D496 Neoplasm of unspecified behavior of brain: Secondary | ICD-10-CM | POA: Diagnosis not present

## 2012-05-07 DIAGNOSIS — Z0389 Encounter for observation for other suspected diseases and conditions ruled out: Secondary | ICD-10-CM | POA: Diagnosis not present

## 2012-05-07 DIAGNOSIS — C7931 Secondary malignant neoplasm of brain: Secondary | ICD-10-CM | POA: Diagnosis not present

## 2012-05-07 DIAGNOSIS — C7949 Secondary malignant neoplasm of other parts of nervous system: Secondary | ICD-10-CM | POA: Diagnosis not present

## 2012-05-12 ENCOUNTER — Other Ambulatory Visit: Payer: Self-pay | Admitting: Physician Assistant

## 2012-05-19 ENCOUNTER — Ambulatory Visit (HOSPITAL_BASED_OUTPATIENT_CLINIC_OR_DEPARTMENT_OTHER): Payer: Medicare Other | Admitting: Internal Medicine

## 2012-05-19 ENCOUNTER — Encounter: Payer: Self-pay | Admitting: Internal Medicine

## 2012-05-19 ENCOUNTER — Other Ambulatory Visit (HOSPITAL_BASED_OUTPATIENT_CLINIC_OR_DEPARTMENT_OTHER): Payer: Medicare Other

## 2012-05-19 ENCOUNTER — Telehealth: Payer: Self-pay | Admitting: Internal Medicine

## 2012-05-19 ENCOUNTER — Ambulatory Visit (HOSPITAL_BASED_OUTPATIENT_CLINIC_OR_DEPARTMENT_OTHER): Payer: Medicare Other

## 2012-05-19 DIAGNOSIS — Z5111 Encounter for antineoplastic chemotherapy: Secondary | ICD-10-CM | POA: Diagnosis not present

## 2012-05-19 DIAGNOSIS — C7931 Secondary malignant neoplasm of brain: Secondary | ICD-10-CM

## 2012-05-19 DIAGNOSIS — R5381 Other malaise: Secondary | ICD-10-CM

## 2012-05-19 DIAGNOSIS — C3410 Malignant neoplasm of upper lobe, unspecified bronchus or lung: Secondary | ICD-10-CM

## 2012-05-19 DIAGNOSIS — R5383 Other fatigue: Secondary | ICD-10-CM | POA: Diagnosis not present

## 2012-05-19 DIAGNOSIS — C3490 Malignant neoplasm of unspecified part of unspecified bronchus or lung: Secondary | ICD-10-CM

## 2012-05-19 DIAGNOSIS — C7949 Secondary malignant neoplasm of other parts of nervous system: Secondary | ICD-10-CM | POA: Diagnosis not present

## 2012-05-19 LAB — COMPREHENSIVE METABOLIC PANEL (CC13)
ALT: 10 U/L (ref 0–55)
AST: 16 U/L (ref 5–34)
Alkaline Phosphatase: 88 U/L (ref 40–150)
Calcium: 9.6 mg/dL (ref 8.4–10.4)
Chloride: 104 mEq/L (ref 98–107)
Creatinine: 0.7 mg/dL (ref 0.6–1.1)
Potassium: 3.8 mEq/L (ref 3.5–5.1)

## 2012-05-19 LAB — CBC WITH DIFFERENTIAL/PLATELET
BASO%: 0.1 % (ref 0.0–2.0)
Basophils Absolute: 0 10*3/uL (ref 0.0–0.1)
EOS%: 0.5 % (ref 0.0–7.0)
HCT: 40.2 % (ref 34.8–46.6)
HGB: 13.2 g/dL (ref 11.6–15.9)
MCH: 29.6 pg (ref 25.1–34.0)
MCHC: 32.8 g/dL (ref 31.5–36.0)
MCV: 90.1 fL (ref 79.5–101.0)
MONO%: 10.9 % (ref 0.0–14.0)
NEUT%: 67 % (ref 38.4–76.8)
RDW: 14.1 % (ref 11.2–14.5)
lymph#: 1.6 10*3/uL (ref 0.9–3.3)

## 2012-05-19 MED ORDER — HEPARIN SOD (PORK) LOCK FLUSH 100 UNIT/ML IV SOLN
500.0000 [IU] | Freq: Once | INTRAVENOUS | Status: AC | PRN
  Administered 2012-05-19: 500 [IU]
  Filled 2012-05-19: qty 5

## 2012-05-19 MED ORDER — SODIUM CHLORIDE 0.9 % IJ SOLN
10.0000 mL | INTRAMUSCULAR | Status: DC | PRN
  Administered 2012-05-19: 10 mL
  Filled 2012-05-19: qty 10

## 2012-05-19 MED ORDER — SODIUM CHLORIDE 0.9 % IV SOLN
Freq: Once | INTRAVENOUS | Status: AC
  Administered 2012-05-19: 11:00:00 via INTRAVENOUS

## 2012-05-19 MED ORDER — DEXAMETHASONE SODIUM PHOSPHATE 10 MG/ML IJ SOLN
10.0000 mg | Freq: Once | INTRAMUSCULAR | Status: AC
  Administered 2012-05-19: 10 mg via INTRAVENOUS

## 2012-05-19 MED ORDER — SODIUM CHLORIDE 0.9 % IV SOLN
500.0000 mg/m2 | Freq: Once | INTRAVENOUS | Status: AC
  Administered 2012-05-19: 900 mg via INTRAVENOUS
  Filled 2012-05-19: qty 36

## 2012-05-19 MED ORDER — ONDANSETRON 8 MG/50ML IVPB (CHCC)
8.0000 mg | Freq: Once | INTRAVENOUS | Status: AC
  Administered 2012-05-19: 8 mg via INTRAVENOUS

## 2012-05-19 NOTE — Telephone Encounter (Signed)
gv and printed appt sched and avs for June...emaield MW to move 6.3.14 tx after est appt

## 2012-05-19 NOTE — Patient Instructions (Signed)
[facility] Discharge Instructions for Patients Receiving Chemotherapy  Today you received the following chemotherapy agents :  Alimta  To help prevent nausea and vomiting after your treatment, we encourage you to take your nausea medication.   If you develop nausea and vomiting that is not controlled by your nausea medication, call the clinic. If it is after clinic hours your family physician or the after hours number for the clinic or go to the Emergency Department.   BELOW ARE SYMPTOMS THAT SHOULD BE REPORTED IMMEDIATELY:  *FEVER GREATER THAN 100.5 F  *CHILLS WITH OR WITHOUT FEVER  NAUSEA AND VOMITING THAT IS NOT CONTROLLED WITH YOUR NAUSEA MEDICATION  *UNUSUAL SHORTNESS OF BREATH  *UNUSUAL BRUISING OR BLEEDING  TENDERNESS IN MOUTH AND THROAT WITH OR WITHOUT PRESENCE OF ULCERS  *URINARY PROBLEMS  *BOWEL PROBLEMS  UNUSUAL RASH Items with * indicate a potential emergency and should be followed up as soon as possible.   Please let the nurse know about any problems that you may have experienced. Feel free to call the clinic you have any questions or concerns. The clinic phone number is [phone].   I have been informed and understand all the instructions given to me. I know to contact the clinic, my physician, or go to the Emergency Department if any problems should occur. I do not have any questions at this time, but understand that I may call the clinic during office hours   should I have any questions or need assistance in obtaining follow up care.    __________________________________________  _____________  __________ Signature of Patient or Authorized Representative            Date                   Time    __________________________________________ Nurse's Signature

## 2012-05-19 NOTE — Progress Notes (Signed)
[facility] Telephone:(336) [phone]   Fax:(336) [phone]  OFFICE PROGRESS NOTE  [name], MD [address] [place] [address]  Diagnosis:  Danielle [name] is a 62 y.o. Caucasian female with metastatic non-small cell lung cancer initially diagnosed with locally advanced stage IIIB with right Pancoast tumor involving the vertebral body as well as the foraminal canal invasion with spinal cord compression in October of 2007.   Prior Therapy:  1. Status post concurrent chemoradiation with weekly carboplatin and paclitaxel, last dose was given November 18, 2005. 2. 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 3. Status post gamma knife radiotherapy to a solitary brain lesion located in the superior frontal area of the brain at [facility] in April of 2008. 4. Status post palliative radiotherapy to the lateral abdominal wall metastatic lesion under the care of Dr. Mitzi Hansen, completed March of 2009. 5. Status post 6 cycles of systemic chemotherapy with carboplatin and Alimta. Last dose was given July 26, 2007 with disease stabilization. 6. Gamma knife stereotactic radiotherapy to 2 brain lesions one involving the right frontal dural based as well as right parietal lesion performed on 05/07/2012 under the care of Dr. Atlee Abide at [facility].  Current Therapy:  Maintenance chemotherapy with Alimta 500 mg per meter squared given every 3 weeks. The patient is status post 81 cycles.  INTERVAL HISTORY: Danielle [name] 62 y.o. female returns to the clinic today for followup visit accompanied by her sister. The patient is feeling fine today with no specific complaints except for mild fatigue. She is recovering well from her recent stereotactic radiotherapy to 2 brain lesions performed at [facility] on May 1st 2014. She denied having any  significant nausea or vomiting. She has no chest pain, shortness breath, cough or hemoptysis. The patient is tolerating her treatment with maintenance Alimta fairly well with no significant adverse effects.  MEDICAL HISTORY: Past Medical History  Diagnosis Date  . Hypertension   . Anemia   . H/O: pneumonia   . History of tobacco abuse quit 10/08    on nicotine patch  . Fibromyalgia   . Hypokalemia   . DJD (degenerative joint disease), cervical   . Thrush 12/11/2010  . 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 had chemo and radiation for lung ca    ALLERGIES:  is allergic to contrast media; co-trimoxazole injection; iohexol; and sulfa drugs cross reactors.  MEDICATIONS:  Current Outpatient Prescriptions  Medication Sig Dispense Refill  . ALPRAZolam (XANAX) 0.25 MG tablet TAKE 1 TABLET BY MOUTH AT BEDTIME AS NEEDED  30 tablet  0  . amLODipine (NORVASC) 10 MG tablet Take 1 tablet (10 mg total) by mouth daily.  90 tablet  3  . antipyrine-benzocaine (AURALGAN) otic solution Place 1 drop into both ears daily.      . folic acid (FOLVITE) 1 MG tablet Take 1 tablet (1 mg total) by mouth daily.  90 tablet  3  . lidocaine-prilocaine (EMLA) cream Apply topically as needed.  30 g  1  . lisinopril (PRINIVIL,ZESTRIL) 5 MG tablet TAKE 1 TABLET BY MOUTH EVERY DAY  90 tablet  0  . meclizine (ANTIVERT) 12.5 MG tablet Take 1-2 tablets (12.5-25 mg total) by mouth 3 (three) times daily as needed.  30 tablet  0  . morphine (MS CONTIN) 60 MG 12  hr tablet Take 1 tablet (60 mg total) by mouth 2 (two) times daily.  60 tablet  0  . morphine (MSIR) 30 MG tablet Take 1 tablet (30 mg total) by mouth every 4 (four) hours as needed. Take 1 tab every 4-6 hours as needed for breakthrough pain  60 tablet  0  . ondansetron (ZOFRAN) 8 MG tablet Take 1 tablet (8 mg total) by mouth every 8 (eight) hours as needed for nausea.  30 tablet  2  . PATANOL 0.1 % ophthalmic solution PUT 1 DROP IN  AFFECTED EYE(S) 2X A DAY  5 mL  1  . predniSONE (DELTASONE) 50 MG tablet 13 hrs, 7 hrs, and 1 hr before scheduled CT scans       No current facility-administered medications for this visit.    SURGICAL HISTORY:  Past Surgical History  Procedure Laterality Date  . Cervical laminectomy  1995  . Other surgical history  2008    Gamma knife surgery to remove brain met   . 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 Knee surgery  1990    Left x 2  . Portacath placement  -[name]    TIP IN CAVOATRIAL JUNCTION    REVIEW OF SYSTEMS:  A comprehensive review of systems was negative except for: Constitutional: positive for fatigue   PHYSICAL EXAMINATION: General appearance: alert, cooperative and no distress Head: Normocephalic, without obvious abnormality, atraumatic Neck: no adenopathy Lymph nodes: Cervical, supraclavicular, and axillary nodes normal. Resp: clear to auscultation bilaterally Cardio: regular rate and rhythm, S1, S2 normal, no murmur, click, rub or gallop GI: soft, non-tender; bowel sounds normal; no masses,  no organomegaly Extremities: extremities normal, atraumatic, no cyanosis or edema Neurologic: Alert and oriented X 3, normal strength and tone. Normal symmetric reflexes. Normal coordination and gait  ECOG PERFORMANCE STATUS: 1 - Symptomatic but completely ambulatory  Blood pressure 135/78, pulse 76, temperature 97.6 F (36.4 C), temperature source Oral, resp. rate 18, height 5\' 4"  (1.626 m), weight 156 lb 11.2 oz (71.079 kg).  LABORATORY DATA: Lab Results  Component Value Date   WBC 7.5 05/19/2012   HGB 13.2 05/19/2012   HCT 40.2 05/19/2012   MCV 90.1 05/19/2012   PLT 361 05/19/2012      Chemistry      Component Value Date/Time   NA 140 05/19/2012 0752   NA 135 08/20/2011 0953   K 3.8 05/19/2012 0752   K 4.4 08/20/2011 0953   CL 104 05/19/2012 0752   CL 105 08/20/2011 0953   CO2 26 05/19/2012 0752   CO2 28 08/20/2011 0953   BUN 9.0  05/19/2012 0752   BUN 13 08/20/2011 0953   CREATININE 0.7 05/19/2012 0752   CREATININE 0.80 08/20/2011 0953      Component Value Date/Time   CALCIUM 9.6 05/19/2012 0752   CALCIUM 9.4 08/20/2011 0953   ALKPHOS 88 05/19/2012 0752   ALKPHOS 73 08/20/2011 0953   AST 16 05/19/2012 0752   AST 16 08/20/2011 0953   ALT 10 05/19/2012 0752   ALT 9 08/20/2011 0953   BILITOT 0.44 05/19/2012 0752   BILITOT 0.5 08/20/2011 0953       RADIOGRAPHIC STUDIES: No results found.  ASSESSMENT: this is a very pleasant 62 years old white female with metastatic non-small cell lung cancer, adenocarcinoma with recent new brain lesions status post stereotactic radiotherapy. The patient has been on treatment with maintenance Alimta for 81 cycles. She is  tolerating her treatment fairly well.  PLAN: I recommended for the patient to proceed with her chemotherapy today as scheduled for cycle #82. She would come back for follow up visit in 3 weeks with the next cycle of her chemotherapy with repeat CT scan of the chest, abdomen and pelvis for restaging of her disease. For pain management, the patient will continue on MS Contin 60 mg by mouth every 12 hours in addition to MSIR 30 mg by mouth every 6 hours as needed. She was advised to call immediately if she has any concerning symptoms in the interval.  All questions were answered. The patient knows to call the clinic with any problems, questions or concerns. We can certainly see the patient much sooner if necessary.

## 2012-05-19 NOTE — Patient Instructions (Signed)
Continue treatment with maintenance Alimta as scheduled. Follow up visit in 3 weeks with repeat CT scan of the chest, abdomen and pelvis

## 2012-05-23 ENCOUNTER — Other Ambulatory Visit: Payer: Self-pay | Admitting: Family Medicine

## 2012-05-27 ENCOUNTER — Encounter: Payer: Self-pay | Admitting: Family Medicine

## 2012-05-27 ENCOUNTER — Ambulatory Visit (INDEPENDENT_AMBULATORY_CARE_PROVIDER_SITE_OTHER): Payer: Medicare Other | Admitting: Family Medicine

## 2012-05-27 VITALS — BP 108/64 | HR 84 | Temp 98.6°F | Wt 158.5 lb

## 2012-05-27 DIAGNOSIS — H6980 Other specified disorders of Eustachian tube, unspecified ear: Secondary | ICD-10-CM | POA: Diagnosis not present

## 2012-05-27 MED ORDER — MECLIZINE HCL 12.5 MG PO TABS: 12.5000 mg | ORAL_TABLET | Freq: Three times a day (TID) | ORAL | Status: DC | PRN

## 2012-05-27 MED ORDER — FLUTICASONE PROPIONATE 50 MCG/ACT NA SUSP: 1.0000 | Freq: Every day | NASAL | Status: DC

## 2012-05-27 NOTE — Progress Notes (Signed)
She just had gamma knife for mets (3 weeks ago).  No fevers.  Not SOB.  She isn't on O2 now.  She is still on alimta per onc.   R ear pain and some concurrent intermittent positional vertigo. She has been taking abx prev, over 1 month ago.  She had brain imaging recently at [facility] and pt was told that the CA wouldn't have caused the vertigo.  Hearing is at baseline.  No L ear pain. No rhinorrhea, mildly scratchy throat and this is improved. No fevers.    Meds, vitals, and allergies reviewed.   ROS: See HPI.  Otherwise, noncontributory.  nad ncat TM w/o erythema B but R effusion noted Nasal exam stuffy OP wnl  Neck supple, no LA rrr ctab

## 2012-05-27 NOTE — Assessment & Plan Note (Signed)
No TM movement on valsalva.  Would use flonase for now with routine cautions and f/u prn.  Gently use valsalva in meantime.  D/w pt, she agrees.

## 2012-05-27 NOTE — Patient Instructions (Addendum)
Use the nasal spray and gently try to pop your ears.  That should help with the pressure. Take care.  You can stop the spray after you are feeling better.

## 2012-06-05 ENCOUNTER — Ambulatory Visit (HOSPITAL_COMMUNITY)
Admission: RE | Admit: 2012-06-05 | Discharge: 2012-06-05 | Disposition: A | Discharge status: Home / Self Care | Payer: Medicare Other | Source: Ambulatory Visit | Attending: Internal Medicine | Admitting: Internal Medicine

## 2012-06-05 DIAGNOSIS — C801 Malignant (primary) neoplasm, unspecified: Secondary | ICD-10-CM | POA: Diagnosis not present

## 2012-06-05 DIAGNOSIS — C3490 Malignant neoplasm of unspecified part of unspecified bronchus or lung: Secondary | ICD-10-CM | POA: Insufficient documentation

## 2012-06-05 DIAGNOSIS — C7800 Secondary malignant neoplasm of unspecified lung: Secondary | ICD-10-CM | POA: Diagnosis not present

## 2012-06-05 DIAGNOSIS — J438 Other emphysema: Secondary | ICD-10-CM | POA: Insufficient documentation

## 2012-06-05 MED ORDER — IOHEXOL 300 MG/ML  SOLN
100.0000 mL | Freq: Once | INTRAMUSCULAR | Status: AC | PRN
  Administered 2012-06-05: 100 mL via INTRAVENOUS

## 2012-06-09 ENCOUNTER — Ambulatory Visit (HOSPITAL_BASED_OUTPATIENT_CLINIC_OR_DEPARTMENT_OTHER): Payer: Medicare Other | Admitting: Internal Medicine

## 2012-06-09 ENCOUNTER — Encounter: Payer: Self-pay | Admitting: *Deleted

## 2012-06-09 ENCOUNTER — Ambulatory Visit (HOSPITAL_BASED_OUTPATIENT_CLINIC_OR_DEPARTMENT_OTHER): Payer: Medicare Other

## 2012-06-09 ENCOUNTER — Other Ambulatory Visit: Payer: Medicare Other | Admitting: Lab

## 2012-06-09 ENCOUNTER — Encounter: Payer: Self-pay | Admitting: Internal Medicine

## 2012-06-09 ENCOUNTER — Other Ambulatory Visit (HOSPITAL_BASED_OUTPATIENT_CLINIC_OR_DEPARTMENT_OTHER): Payer: Medicare Other | Admitting: Lab

## 2012-06-09 ENCOUNTER — Telehealth: Payer: Self-pay | Admitting: *Deleted

## 2012-06-09 ENCOUNTER — Telehealth: Payer: Self-pay | Admitting: Internal Medicine

## 2012-06-09 DIAGNOSIS — C3410 Malignant neoplasm of upper lobe, unspecified bronchus or lung: Secondary | ICD-10-CM | POA: Diagnosis not present

## 2012-06-09 DIAGNOSIS — Z5111 Encounter for antineoplastic chemotherapy: Secondary | ICD-10-CM | POA: Diagnosis not present

## 2012-06-09 DIAGNOSIS — C7949 Secondary malignant neoplasm of other parts of nervous system: Secondary | ICD-10-CM

## 2012-06-09 DIAGNOSIS — C7931 Secondary malignant neoplasm of brain: Secondary | ICD-10-CM

## 2012-06-09 LAB — CBC WITH DIFFERENTIAL/PLATELET
BASO%: 0.4 % (ref 0.0–2.0)
HCT: 37.7 % (ref 34.8–46.6)
HGB: 12.7 g/dL (ref 11.6–15.9)
MCHC: 33.8 g/dL (ref 31.5–36.0)
MONO#: 0.9 10*3/uL (ref 0.1–0.9)
NEUT%: 66.2 % (ref 38.4–76.8)
RDW: 14.1 % (ref 11.2–14.5)
WBC: 8.9 10*3/uL (ref 3.9–10.3)
lymph#: 2.1 10*3/uL (ref 0.9–3.3)

## 2012-06-09 LAB — COMPREHENSIVE METABOLIC PANEL (CC13)
ALT: 10 U/L (ref 0–55)
AST: 15 U/L (ref 5–34)
Creatinine: 0.8 mg/dL (ref 0.6–1.1)
Total Bilirubin: 0.32 mg/dL (ref 0.20–1.20)

## 2012-06-09 MED ORDER — SODIUM CHLORIDE 0.9 % IV SOLN
Freq: Once | INTRAVENOUS | Status: AC
  Administered 2012-06-09: 12:00:00 via INTRAVENOUS

## 2012-06-09 MED ORDER — SODIUM CHLORIDE 0.9 % IV SOLN
500.0000 mg/m2 | Freq: Once | INTRAVENOUS | Status: AC
  Administered 2012-06-09: 900 mg via INTRAVENOUS
  Filled 2012-06-09: qty 36

## 2012-06-09 MED ORDER — SODIUM CHLORIDE 0.9 % IJ SOLN
10.0000 mL | INTRAMUSCULAR | Status: DC | PRN
  Administered 2012-06-09: 10 mL
  Filled 2012-06-09: qty 10

## 2012-06-09 MED ORDER — ONDANSETRON 8 MG/50ML IVPB (CHCC)
8.0000 mg | Freq: Once | INTRAVENOUS | Status: AC
  Administered 2012-06-09: 8 mg via INTRAVENOUS

## 2012-06-09 MED ORDER — HEPARIN SOD (PORK) LOCK FLUSH 100 UNIT/ML IV SOLN
500.0000 [IU] | Freq: Once | INTRAVENOUS | Status: AC | PRN
  Administered 2012-06-09: 500 [IU]
  Filled 2012-06-09: qty 5

## 2012-06-09 MED ORDER — DEXAMETHASONE SODIUM PHOSPHATE 10 MG/ML IJ SOLN
10.0000 mg | Freq: Once | INTRAMUSCULAR | Status: AC
  Administered 2012-06-09: 10 mg via INTRAVENOUS

## 2012-06-09 NOTE — Progress Notes (Signed)
[facility] [phone]   [phone]  OFFICE PROGRESS NOTE  [name], MD [address] [place] [address]  Diagnosis:  Metastatic non-small cell lung cancer initially diagnosed with locally advanced stage IIIB with right Pancoast tumor involving the vertebral body as well as the foraminal canal invasion with spinal cord compression in October of 2007.   Prior Therapy:  1. Status post concurrent chemoradiation with weekly carboplatin and paclitaxel, last dose was given November 18, 2005. 2. 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 3. Status post gamma knife radiotherapy to a solitary brain lesion located in the superior frontal area of the brain at [facility] in April of 2008. 4. Status post palliative radiotherapy to the lateral abdominal wall metastatic lesion under the care of Dr. Mitzi Hansen, completed March of 2009. 5. Status post 6 cycles of systemic chemotherapy with carboplatin and Alimta. Last dose was given July 26, 2007 with disease stabilization. 6. Gamma knife stereotactic radiotherapy to 2 brain lesions one involving the right frontal dural based as well as right parietal lesion performed on 05/07/2012 under the care of Dr. Atlee Abide at [facility].  Current Therapy:  Maintenance chemotherapy with Alimta 500 mg per meter squared given every 3 weeks. The patient is status post 82 cycles.  INTERVAL HISTORY: Danielle [name] 62 y.o. female returns to the clinic today for followup visit. The patient is feeling fine today with no specific complaints. She denied having any significant chest pain, shortness breath, cough or hemoptysis. She denied having any nausea or vomiting. She has no fever or chills. She is tolerating her maintenance treatment with single agent Alimta fairly well with no significant adverse  effects. The patient had repeat CT scan of the chest, abdomen and pelvis performed recently and she is here for evaluation and discussion of her scan results.   MEDICAL HISTORY: Past Medical History  Diagnosis Date  . Hypertension   . Anemia   . H/O: pneumonia   . History of tobacco abuse quit 10/08    on nicotine patch  . Fibromyalgia   . Hypokalemia   . DJD (degenerative joint disease), cervical   . Thrush 12/11/2010  . 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 had chemo and radiation for lung ca    ALLERGIES:  is allergic to contrast media; co-trimoxazole injection; iohexol; and sulfa drugs cross reactors.  MEDICATIONS:  Current Outpatient Prescriptions  Medication Sig Dispense Refill  . ALPRAZolam (XANAX) 0.25 MG tablet TAKE 1 TABLET BY MOUTH AT BEDTIME AS NEEDED  30 tablet  0  . amLODipine (NORVASC) 10 MG tablet Take 1 tablet (10 mg total) by mouth daily.  90 tablet  3  . antipyrine-benzocaine (AURALGAN) otic solution Place 1 drop into both ears daily.      . fluticasone (FLONASE) 50 MCG/ACT nasal spray Place 1-2 sprays into the nose daily.  16 g  1  . folic acid (FOLVITE) 1 MG tablet Take 1 tablet (1 mg total) by mouth daily.  90 tablet  3  . lidocaine-prilocaine (EMLA) cream Apply topically as needed.  30 g  1  . lisinopril (PRINIVIL,ZESTRIL) 5 MG tablet TAKE 1 TABLET BY MOUTH EVERY DAY  90 tablet  0  . meclizine (ANTIVERT) 12.5 MG tablet Take 1-2 tablets (12.5-25 mg total) by mouth 3 (three) times daily  as needed.  30 tablet  1  . morphine (MS CONTIN) 60 MG 12 hr tablet Take 1 tablet (60 mg total) by mouth 2 (two) times daily.  60 tablet  0  . morphine (MSIR) 30 MG tablet Take 1 tablet (30 mg total) by mouth every 4 (four) hours as needed. Take 1 tab every 4-6 hours as needed for breakthrough pain  60 tablet  0  . ondansetron (ZOFRAN) 8 MG tablet Take 1 tablet (8 mg total) by mouth every 8 (eight) hours as needed for nausea.  30 tablet  2  .  PATANOL 0.1 % ophthalmic solution PUT 1 DROP IN AFFECTED EYE(S) 2X A DAY  5 mL  1  . predniSONE (DELTASONE) 50 MG tablet 13 hrs, 7 hrs, and 1 hr before scheduled CT scans       No current facility-administered medications for this visit.    SURGICAL HISTORY:  Past Surgical History  Procedure Laterality Date  . Cervical laminectomy  1995  . Other surgical history  2008    Gamma knife surgery to remove brain met   . 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 Knee surgery  1990    Left x 2  . Portacath placement  -[name]    TIP IN CAVOATRIAL JUNCTION    REVIEW OF SYSTEMS:  A comprehensive review of systems was negative.   PHYSICAL EXAMINATION: General appearance: alert, cooperative and no distress Head: Normocephalic, without obvious abnormality, atraumatic Neck: no adenopathy Lymph nodes: Cervical, supraclavicular, and axillary nodes normal. Resp: clear to auscultation bilaterally Cardio: regular rate and rhythm, S1, S2 normal, no murmur, click, rub or gallop GI: soft, non-tender; bowel sounds normal; no masses,  no organomegaly Extremities: extremities normal, atraumatic, no cyanosis or edema Neurologic: Alert and oriented X 3, normal strength and tone. Normal symmetric reflexes. Normal coordination and gait  ECOG PERFORMANCE STATUS: 1 - Symptomatic but completely ambulatory  Blood pressure 131/81, pulse 83, temperature 97.2 F (36.2 C), temperature source Oral, resp. rate 18, height 5\' 4"  (1.626 m), weight 157 lb 1.6 oz (71.26 kg).  LABORATORY DATA: Lab Results  Component Value Date   WBC 8.9 06/09/2012   HGB 12.7 06/09/2012   HCT 37.7 06/09/2012   MCV 88.3 06/09/2012   PLT 391 06/09/2012      Chemistry      Component Value Date/Time   NA 142 06/09/2012 1012   NA 135 08/20/2011 0953   K 4.0 06/09/2012 1012   K 4.4 08/20/2011 0953   CL 106 06/09/2012 1012   CL 105 08/20/2011 0953   CO2 27 06/09/2012 1012   CO2 28 08/20/2011 0953   BUN 8.1 06/09/2012 1012     BUN 13 08/20/2011 0953   CREATININE 0.8 06/09/2012 1012   CREATININE 0.80 08/20/2011 0953      Component Value Date/Time   CALCIUM 9.3 06/09/2012 1012   CALCIUM 9.4 08/20/2011 0953   ALKPHOS 87 06/09/2012 1012   ALKPHOS 73 08/20/2011 0953   AST 15 06/09/2012 1012   AST 16 08/20/2011 0953   ALT 10 06/09/2012 1012   ALT 9 08/20/2011 0953   BILITOT 0.32 06/09/2012 1012   BILITOT 0.5 08/20/2011 0953       RADIOGRAPHIC STUDIES: Ct Chest W Contrast  06/05/2012   *RADIOLOGY REPORT*  Clinical Data:  Met lung cancer  CT CHEST, ABDOMEN AND PELVIS WITH CONTRAST  Technique:  Multidetector CT imaging of the chest, abdomen  and pelvis was performed following the standard protocol during bolus administration of intravenous contrast.  Contrast: OMNIPAQUE IOHEXOL 300 MG/ML  SOLN  Comparison:   03/16/2012   CT CHEST  Findings: There is no pleural effusion identified.  Advanced changes of centrilobular emphysema identified.  Radiation change within the right upper lobe is again noted and appears similar to previous exam.  Right apical subpleural lesion measures 1.3 x 2.5 cm, image number nine/series 2.  Previously this measured 1.2 x 1.9 cm. Nonspecific area of nodular pleural thickening within the anterior right upper lobe measures 1.1 x 1.5 cm, image 17/series 2.  This is unchanged from previous exam.  The heart size appears normal.  There is no pericardial effusion. No enlarged mediastinal or hilar lymph nodes identified.  There is no axillary or supraclavicular adenopathy.  Stable vertebral plana deformity involving T3.  The vertebral plasty has been performed at T4 and T7.  No new compression fractures identified.  IMPRESSION:  1.  Mild pleural thickening within the posterior medial aspect of the right apex is slightly increased in size from previous exam. This warrants close interval follow-up to exclude recurrence of disease.  PET CT may be helpful to assess for hypermetabolism in this area and to determine  likelihood of recurrent tumor. 2.  Emphysema.   CT ABDOMEN AND PELVIS  Findings:  There is no focal liver abnormality identified.  The gallbladder is normal.  No biliary dilatation identified.  Normal appearance of the pancreas.  The spleen is normal.  The adrenal glands are both unremarkable.  Normal appearance of the kidneys. The urinary bladder appears normal.  The uterus and adnexal structures are unremarkable.  Normal caliber of the abdominal aorta.  Atherosclerotic disease noted.  No enlarged retroperitoneal lymph nodes.  No pelvic or inguinal adenopathy identified.  Small peritoneal deposit within the left side of the abdomen measures 5 mm, image 85/series 2.  This is stable from previous exam.  Stable appearance of mild, irregular peritoneal thickening along the pericolic gutters.  There is no new or progressive peritoneal disease identified.  The stomach appears normal.  The small bowel loops are unremarkable.  The appendix appears prominent measuring 9 mm in thickness.  This is similar to previous exam.  Colon is unremarkable.  Review of the visualized osseous structures demonstrates a stable benign appearing sclerotic lesion within the left acetabulum measuring 7 mm, image 118/series 2.  There is no aggressive lytic or sclerotic bone lesions identified.  IMPRESSION:  1.  Stable CT of the chest. 2.  Stable mild peritoneal thickening and irregularity with small nodule in the left abdomen.   Original Report Authenticated By: Signa Kell, M.D.   ASSESSMENT: This is a very pleasant 62 years old white female with metastatic non-small cell lung cancer currently on maintenance chemotherapy with single agent Alimta status post 82 cycles. The patient is tolerating her treatment fairly well with no significant adverse effect of her evidence for disease progression on the recent scan.  PLAN: I discussed the scan results with the patient today. I recommended for her to continue on maintenance chemotherapy with  single agent Alimta as scheduled. She will receive cycle #83 today. The patient would come back for followup visit in 3 weeks with the next cycle of her chemotherapy. She was advised to call immediately if she has any concerning symptoms in the interval.  All questions were answered. The patient knows to call the clinic with any problems, questions or concerns. We can certainly  see the patient much sooner if necessary.  I spent 15 minutes counseling the patient face to face. The total time spent in the appointment was 25 minutes.

## 2012-06-09 NOTE — Telephone Encounter (Signed)
Per staff message and POF I have scheduled appts.  JMW  

## 2012-06-09 NOTE — Patient Instructions (Signed)
Cycle #83!!!!! Pemetrexed injection   What is this medicine? PEMETREXED (PEM e TREX ed) is a chemotherapy drug. This medicine affects cells that are rapidly growing, such as cancer cells and cells in your mouth and stomach. It is usually used to treat lung cancers like non-small cell lung cancer and mesothelioma. It may also be used to treat other cancers. This medicine may be used for other purposes; ask your health care provider or pharmacist if you have questions. What should I tell my health care provider before I take this medicine? They need to know if you have any of these conditions: -if you frequently drink alcohol containing beverages -infection (especially a virus infection such as chickenpox, cold sores, or herpes) -kidney disease -liver disease -low blood counts, like low platelets, red bloods, or white blood cells -an unusual or allergic reaction to pemetrexed, mannitol, other medicines, foods, dyes, or preservatives -pregnant or trying to get pregnant -breast-feeding How should I use this medicine? This drug is given as an infusion into a vein. It is administered in a hospital or clinic by a specially trained health care professional. Talk to your pediatrician regarding the use of this medicine in children. Special care may be needed. Overdosage: If you think you have taken too much of this medicine contact a poison control center or emergency room at once. NOTE: This medicine is only for you. Do not share this medicine with others. What if I miss a dose? It is important not to miss your dose. Call your doctor or health care professional if you are unable to keep an appointment. What may interact with this medicine? -aspirin and aspirin-like medicines -medicines to increase blood counts like filgrastim, pegfilgrastim, sargramostim -methotrexate -NSAIDS, medicines for pain and inflammation, like ibuprofen or naproxen -probenecid -pyrimethamine -vaccines Talk to your  doctor or health care professional before taking any of these medicines: -acetaminophen -aspirin -ibuprofen -ketoprofen -naproxen This list may not describe all possible interactions. Give your health care provider a list of all the medicines, herbs, non-prescription drugs, or dietary supplements you use. Also tell them if you smoke, drink alcohol, or use illegal drugs. Some items may interact with your medicine. What should I watch for while using this medicine? Visit your doctor for checks on your progress. This drug may make you feel generally unwell. This is not uncommon, as chemotherapy can affect healthy cells as well as cancer cells. Report any side effects. Continue your course of treatment even though you feel ill unless your doctor tells you to stop. In some cases, you may be given additional medicines to help with side effects. Follow all directions for their use. Call your doctor or health care professional for advice if you get a fever, chills or sore throat, or other symptoms of a cold or flu. Do not treat yourself. This drug decreases your body's ability to fight infections. Try to avoid being around people who are sick. This medicine may increase your risk to bruise or bleed. Call your doctor or health care professional if you notice any unusual bleeding. Be careful brushing and flossing your teeth or using a toothpick because you may get an infection or bleed more easily. If you have any dental work done, tell your dentist you are receiving this medicine. Avoid taking products that contain aspirin, acetaminophen, ibuprofen, naproxen, or ketoprofen unless instructed by your doctor. These medicines may hide a fever. Call your doctor or health care professional if you get diarrhea or mouth sores. Do not treat  yourself. To protect your kidneys, drink water or other fluids as directed while you are taking this medicine. Men and women must use effective birth control while taking this  medicine. You may also need to continue using effective birth control for a time after stopping this medicine. Do not become pregnant while taking this medicine. Tell your doctor right away if you think that you or your partner might be pregnant. There is a potential for serious side effects to an unborn child. Talk to your health care professional or pharmacist for more information. Do not breast-feed an infant while taking this medicine. This medicine may lower sperm counts. What side effects may I notice from receiving this medicine? Side effects that you should report to your doctor or health care professional as soon as possible: -allergic reactions like skin rash, itching or hives, swelling of the face, lips, or tongue -low blood counts - this medicine may decrease the number of white blood cells, red blood cells and platelets. You may be at increased risk for infections and bleeding. -signs of infection - fever or chills, cough, sore throat, pain or difficulty passing urine -signs of decreased platelets or bleeding - bruising, pinpoint red spots on the skin, black, tarry stools, blood in the urine -signs of decreased red blood cells - unusually weak or tired, fainting spells, lightheadedness -breathing problems, like a dry cough -changes in emotions or moods -chest pain -confusion -diarrhea -high blood pressure -mouth or throat sores or ulcers -pain, swelling, warmth in the leg -pain on swallowing -swelling of the ankles, feet, hands -trouble passing urine or change in the amount of urine -vomiting -yellowing of the eyes or skin Side effects that usually do not require medical attention (report to your doctor or health care professional if they continue or are bothersome): -hair loss -loss of appetite -nausea -stomach upset This list may not describe all possible side effects. Call your doctor for medical advice about side effects. You may report side effects to FDA at  1-800-FDA-1088. Where should I keep my medicine? This drug is given in a hospital or clinic and will not be stored at home. NOTE: This sheet is a summary. It may not cover all possible information. If you have questions about this medicine, talk to your doctor, pharmacist, or health care provider.  2013, [employer]. (07/28/2007 1:24:03 PM)

## 2012-06-09 NOTE — Patient Instructions (Signed)
Continue chemotherapy with Alimta as scheduled. Followup visit in 3 weeks with the next cycle.

## 2012-06-09 NOTE — Telephone Encounter (Signed)
gv and printed appt sched and avs for pt....emailed MW to add tx....pt ok adn aware

## 2012-06-17 ENCOUNTER — Other Ambulatory Visit: Payer: Self-pay | Admitting: Medical Oncology

## 2012-06-17 MED ORDER — MORPHINE SULFATE ER 60 MG PO TBCR: 60.0000 mg | EXTENDED_RELEASE_TABLET | Freq: Two times a day (BID) | ORAL | Status: DC

## 2012-06-17 MED ORDER — MORPHINE SULFATE 30 MG PO TABS: 30.0000 mg | ORAL_TABLET | ORAL | Status: DC | PRN

## 2012-06-17 MED ORDER — ALPRAZOLAM 0.25 MG PO TABS: 0.2500 mg | ORAL_TABLET | Freq: Every evening | ORAL | Status: DC | PRN

## 2012-06-17 NOTE — Telephone Encounter (Signed)
Called in xanax and morphine refills signed per [name] and locked in injection room.

## 2012-06-29 ENCOUNTER — Encounter: Payer: Self-pay | Admitting: *Deleted

## 2012-06-30 ENCOUNTER — Other Ambulatory Visit: Payer: Self-pay | Admitting: Medical Oncology

## 2012-06-30 ENCOUNTER — Encounter: Payer: Self-pay | Admitting: Internal Medicine

## 2012-06-30 ENCOUNTER — Telehealth: Payer: Self-pay | Admitting: *Deleted

## 2012-06-30 ENCOUNTER — Other Ambulatory Visit (HOSPITAL_BASED_OUTPATIENT_CLINIC_OR_DEPARTMENT_OTHER): Payer: Medicare Other | Admitting: Lab

## 2012-06-30 ENCOUNTER — Ambulatory Visit (HOSPITAL_BASED_OUTPATIENT_CLINIC_OR_DEPARTMENT_OTHER): Payer: Medicare Other

## 2012-06-30 ENCOUNTER — Telehealth: Payer: Self-pay | Admitting: Internal Medicine

## 2012-06-30 ENCOUNTER — Other Ambulatory Visit: Payer: Medicare Other | Admitting: Lab

## 2012-06-30 ENCOUNTER — Ambulatory Visit (HOSPITAL_BASED_OUTPATIENT_CLINIC_OR_DEPARTMENT_OTHER): Payer: Medicare Other | Admitting: Internal Medicine

## 2012-06-30 DIAGNOSIS — Z5111 Encounter for antineoplastic chemotherapy: Secondary | ICD-10-CM | POA: Diagnosis not present

## 2012-06-30 DIAGNOSIS — C3410 Malignant neoplasm of upper lobe, unspecified bronchus or lung: Secondary | ICD-10-CM

## 2012-06-30 DIAGNOSIS — C7949 Secondary malignant neoplasm of other parts of nervous system: Secondary | ICD-10-CM

## 2012-06-30 DIAGNOSIS — C3490 Malignant neoplasm of unspecified part of unspecified bronchus or lung: Secondary | ICD-10-CM

## 2012-06-30 DIAGNOSIS — C7931 Secondary malignant neoplasm of brain: Secondary | ICD-10-CM | POA: Diagnosis not present

## 2012-06-30 LAB — CBC WITH DIFFERENTIAL/PLATELET
BASO%: 0.6 % (ref 0.0–2.0)
EOS%: 0.7 % (ref 0.0–7.0)
HCT: 36.3 % (ref 34.8–46.6)
LYMPH%: 27.4 % (ref 14.0–49.7)
MCH: 30.1 pg (ref 25.1–34.0)
MCHC: 34.2 g/dL (ref 31.5–36.0)
MONO#: 0.7 10*3/uL (ref 0.1–0.9)
NEUT%: 60.6 % (ref 38.4–76.8)
Platelets: 430 10*3/uL — ABNORMAL HIGH (ref 145–400)
RBC: 4.13 10*6/uL (ref 3.70–5.45)
WBC: 6.5 10*3/uL (ref 3.9–10.3)

## 2012-06-30 LAB — COMPREHENSIVE METABOLIC PANEL (CC13)
ALT: 8 U/L (ref 0–55)
AST: 14 U/L (ref 5–34)
Alkaline Phosphatase: 77 U/L (ref 40–150)
Creatinine: 0.8 mg/dL (ref 0.6–1.1)
Sodium: 141 mEq/L (ref 136–145)
Total Bilirubin: 0.36 mg/dL (ref 0.20–1.20)
Total Protein: 7.5 g/dL (ref 6.4–8.3)

## 2012-06-30 LAB — RESEARCH LABS

## 2012-06-30 MED ORDER — CYANOCOBALAMIN 1000 MCG/ML IJ SOLN
1000.0000 ug | Freq: Once | INTRAMUSCULAR | Status: AC
Start: 2012-06-30 — End: 2012-06-30
  Administered 2012-06-30: 1000 ug via INTRAMUSCULAR

## 2012-06-30 MED ORDER — SODIUM CHLORIDE 0.9 % IV SOLN
500.0000 mg/m2 | Freq: Once | INTRAVENOUS | Status: AC
  Administered 2012-06-30: 900 mg via INTRAVENOUS
  Filled 2012-06-30: qty 36

## 2012-06-30 MED ORDER — DEXAMETHASONE SODIUM PHOSPHATE 10 MG/ML IJ SOLN
10.0000 mg | Freq: Once | INTRAMUSCULAR | Status: AC
  Administered 2012-06-30: 10 mg via INTRAVENOUS

## 2012-06-30 MED ORDER — ONDANSETRON 8 MG/50ML IVPB (CHCC)
8.0000 mg | Freq: Once | INTRAVENOUS | Status: AC
  Administered 2012-06-30: 8 mg via INTRAVENOUS

## 2012-06-30 NOTE — Patient Instructions (Addendum)
Cancer Center Discharge Instructions for Patients Receiving Chemotherapy  Today you received the following chemotherapy agents Alimta  To help prevent nausea and vomiting after your treatment, we encourage you to take your nausea medication     If you develop nausea and vomiting that is not controlled by your nausea medication, call the clinic.   BELOW ARE SYMPTOMS THAT SHOULD BE REPORTED IMMEDIATELY:  *FEVER GREATER THAN 100.5 F  *CHILLS WITH OR WITHOUT FEVER  NAUSEA AND VOMITING THAT IS NOT CONTROLLED WITH YOUR NAUSEA MEDICATION  *UNUSUAL SHORTNESS OF BREATH  *UNUSUAL BRUISING OR BLEEDING  TENDERNESS IN MOUTH AND THROAT WITH OR WITHOUT PRESENCE OF ULCERS  *URINARY PROBLEMS  *BOWEL PROBLEMS  UNUSUAL RASH Items with * indicate a potential emergency and should be followed up as soon as possible.  Feel free to call the clinic you have any questions or concerns. The clinic phone number is [phone].    

## 2012-06-30 NOTE — Progress Notes (Signed)
[facility] [phone]   [phone]  OFFICE PROGRESS NOTE  [name], MD [address] [place] [address]  Diagnosis:  Metastatic non-small cell lung cancer initially diagnosed with locally advanced stage IIIB with right Pancoast tumor involving the vertebral body as well as the foraminal canal invasion with spinal cord compression in October of 2007.   Prior Therapy:  1. Status post concurrent chemoradiation with weekly carboplatin and paclitaxel, last dose was given November 18, 2005. 2. 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 3. Status post gamma knife radiotherapy to a solitary brain lesion located in the superior frontal area of the brain at [facility] in April of 2008. 4. Status post palliative radiotherapy to the lateral abdominal wall metastatic lesion under the care of Dr. Mitzi Hansen, completed March of 2009. 5. Status post 6 cycles of systemic chemotherapy with carboplatin and Alimta. Last dose was given July 26, 2007 with disease stabilization. 6. Gamma knife stereotactic radiotherapy to 2 brain lesions one involving the right frontal dural based as well as right parietal lesion performed on 05/07/2012 under the care of Dr. Atlee Abide at [facility].  Current Therapy:  Maintenance chemotherapy with Alimta 500 mg per meter squared given every 3 weeks. The patient is status post 83 cycles.   INTERVAL HISTORY: Danielle [name] 62 y.o. female returns to the clinic today for followup visit. The patient is feeling fine today with no specific complaints. She tolerated the last cycle of her systemic chemotherapy fairly well with no significant adverse effects except for mild nausea in the middle of the cycle. She denied having any significant chest pain, shortness breath, cough or hemoptysis. She is scheduled for repeat  MRI of the brain on 07/08/2012 at [facility]. She is here today to start cycle #84 of her chemotherapy.  MEDICAL HISTORY: Past Medical History  Diagnosis Date  . Hypertension   . Anemia   . H/O: pneumonia   . History of tobacco abuse quit 10/08    on nicotine patch  . Fibromyalgia   . Hypokalemia   . DJD (degenerative joint disease), cervical   . Thrush 12/11/2010  . 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 had chemo and radiation for lung ca    ALLERGIES:  is allergic to contrast media; co-trimoxazole injection; iohexol; and sulfa drugs cross reactors.  MEDICATIONS:  Current Outpatient Prescriptions  Medication Sig Dispense Refill  . ALPRAZolam (XANAX) 0.25 MG tablet Take 1 tablet (0.25 mg total) by mouth at bedtime as needed for sleep.  30 tablet  0  . amLODipine (NORVASC) 10 MG tablet Take 1 tablet (10 mg total) by mouth daily.  90 tablet  3  . antipyrine-benzocaine (AURALGAN) otic solution Place 1 drop into both ears daily.      . fluticasone (FLONASE) 50 MCG/ACT nasal spray Place 1-2 sprays into the nose daily.  16 g  1  . folic acid (FOLVITE) 1 MG tablet Take 1 tablet (1 mg total) by mouth daily.  90 tablet  3  . lidocaine-prilocaine (EMLA) cream Apply topically as needed.  30 g  1  . lisinopril (PRINIVIL,ZESTRIL) 5 MG tablet TAKE 1 TABLET BY MOUTH EVERY DAY  90 tablet  0  . meclizine (ANTIVERT) 12.5 MG tablet Take 1-2 tablets (12.5-25 mg total) by mouth 3 (three) times  daily as needed.  30 tablet  1  . morphine (MS CONTIN) 60 MG 12 hr tablet Take 1 tablet (60 mg total) by mouth 2 (two) times daily.  60 tablet  0  . morphine (MSIR) 30 MG tablet Take 1 tablet (30 mg total) by mouth every 4 (four) hours as needed. Take 1 tab every 4-6 hours as needed for breakthrough pain  60 tablet  0  . ondansetron (ZOFRAN) 8 MG tablet Take 1 tablet (8 mg total) by mouth every 8 (eight) hours as needed for nausea.  30 tablet  2  . PATANOL 0.1 %  ophthalmic solution PUT 1 DROP IN AFFECTED EYE(S) 2X A DAY  5 mL  1  . predniSONE (DELTASONE) 50 MG tablet 13 hrs, 7 hrs, and 1 hr before scheduled CT scans       No current facility-administered medications for this visit.    SURGICAL HISTORY:  Past Surgical History  Procedure Laterality Date  . Cervical laminectomy  1995  . Other surgical history  2008    Gamma knife surgery to remove brain met   . 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 Knee surgery  1990    Left x 2  . Portacath placement  -[name]    TIP IN CAVOATRIAL JUNCTION    REVIEW OF SYSTEMS:  A comprehensive review of systems was negative.   PHYSICAL EXAMINATION: General appearance: alert, cooperative and no distress Head: Normocephalic, without obvious abnormality, atraumatic Neck: no adenopathy Lymph nodes: Cervical, supraclavicular, and axillary nodes normal. Resp: clear to auscultation bilaterally Cardio: regular rate and rhythm, S1, S2 normal, no murmur, click, rub or gallop GI: soft, non-tender; bowel sounds normal; no masses,  no organomegaly Extremities: extremities normal, atraumatic, no cyanosis or edema  ECOG PERFORMANCE STATUS: 0 - Asymptomatic  Blood pressure 132/59, pulse 86, temperature 97.9 F (36.6 C), temperature source Oral, resp. rate 18, height 5\' 4"  (1.626 m), weight 157 lb 9.6 oz (71.487 kg).  LABORATORY DATA: Lab Results  Component Value Date   WBC 6.5 06/30/2012   HGB 12.4 06/30/2012   HCT 36.3 06/30/2012   MCV 87.9 06/30/2012   PLT 430* 06/30/2012      Chemistry      Component Value Date/Time   NA 142 06/09/2012 1012   NA 135 08/20/2011 0953   K 4.0 06/09/2012 1012   K 4.4 08/20/2011 0953   CL 106 06/09/2012 1012   CL 105 08/20/2011 0953   CO2 27 06/09/2012 1012   CO2 28 08/20/2011 0953   BUN 8.1 06/09/2012 1012   BUN 13 08/20/2011 0953   CREATININE 0.8 06/09/2012 1012   CREATININE 0.80 08/20/2011 0953      Component Value Date/Time   CALCIUM 9.3 06/09/2012 1012    CALCIUM 9.4 08/20/2011 0953   ALKPHOS 87 06/09/2012 1012   ALKPHOS 73 08/20/2011 0953   AST 15 06/09/2012 1012   AST 16 08/20/2011 0953   ALT 10 06/09/2012 1012   ALT 9 08/20/2011 0953   BILITOT 0.32 06/09/2012 1012   BILITOT 0.5 08/20/2011 0953       ASSESSMENT AND PLAN: This is a very pleasant 61 years old white female with metastatic non-small cell lung cancer currently on maintenance chemotherapy with single agent Alimta status post 83 cycles. The patient is tolerating her treatment fairly well. We'll proceed with cycle #84 today as scheduled.  She would come back for followup visit in 3  weeks with the next cycle of her treatment. She was advised to call immediately if she has any concerning symptoms in the interval.  All questions were answered. The patient knows to call the clinic with any problems, questions or concerns. We can certainly see the patient much sooner if necessary.

## 2012-06-30 NOTE — Telephone Encounter (Signed)
Per staff phone call and POF I have schedueld appts.  JMW  

## 2012-06-30 NOTE — Patient Instructions (Signed)
Continue treatment as scheduled.  Followup visit in 3 weeks.

## 2012-06-30 NOTE — Progress Notes (Signed)
06/30/12 @ 0945, BMS [id number], Visit 2:  Danielle [name] into the [facility] for labs, MD and chemotherapy.  She completed questionnaires in the lobby prior to labs and MD visit.  She will have research labs drawn with her clinical labs prior to seeing MD.  This is her first visit after baseline.

## 2012-06-30 NOTE — Telephone Encounter (Signed)
Gave pt appt for lab, chemo and MD on July and August 2014

## 2012-07-07 ENCOUNTER — Other Ambulatory Visit: Payer: Self-pay | Admitting: Physician Assistant

## 2012-07-08 DIAGNOSIS — C801 Malignant (primary) neoplasm, unspecified: Secondary | ICD-10-CM | POA: Diagnosis not present

## 2012-07-08 DIAGNOSIS — C7931 Secondary malignant neoplasm of brain: Secondary | ICD-10-CM | POA: Diagnosis not present

## 2012-07-08 DIAGNOSIS — C7949 Secondary malignant neoplasm of other parts of nervous system: Secondary | ICD-10-CM | POA: Diagnosis not present

## 2012-07-08 DIAGNOSIS — C3490 Malignant neoplasm of unspecified part of unspecified bronchus or lung: Secondary | ICD-10-CM | POA: Diagnosis not present

## 2012-07-21 ENCOUNTER — Telehealth: Payer: Self-pay | Admitting: *Deleted

## 2012-07-21 ENCOUNTER — Other Ambulatory Visit (HOSPITAL_BASED_OUTPATIENT_CLINIC_OR_DEPARTMENT_OTHER): Payer: Medicare Other | Admitting: Lab

## 2012-07-21 ENCOUNTER — Ambulatory Visit (HOSPITAL_BASED_OUTPATIENT_CLINIC_OR_DEPARTMENT_OTHER): Payer: Medicare Other

## 2012-07-21 ENCOUNTER — Ambulatory Visit (HOSPITAL_BASED_OUTPATIENT_CLINIC_OR_DEPARTMENT_OTHER): Payer: Medicare Other | Admitting: Internal Medicine

## 2012-07-21 ENCOUNTER — Telehealth: Payer: Self-pay | Admitting: Internal Medicine

## 2012-07-21 ENCOUNTER — Encounter: Payer: Self-pay | Admitting: Internal Medicine

## 2012-07-21 ENCOUNTER — Other Ambulatory Visit: Payer: Medicare Other | Admitting: Lab

## 2012-07-21 DIAGNOSIS — C7949 Secondary malignant neoplasm of other parts of nervous system: Secondary | ICD-10-CM | POA: Diagnosis not present

## 2012-07-21 DIAGNOSIS — C50919 Malignant neoplasm of unspecified site of unspecified female breast: Secondary | ICD-10-CM | POA: Diagnosis not present

## 2012-07-21 DIAGNOSIS — C7931 Secondary malignant neoplasm of brain: Secondary | ICD-10-CM

## 2012-07-21 DIAGNOSIS — Z5111 Encounter for antineoplastic chemotherapy: Secondary | ICD-10-CM | POA: Diagnosis not present

## 2012-07-21 DIAGNOSIS — C3410 Malignant neoplasm of upper lobe, unspecified bronchus or lung: Secondary | ICD-10-CM | POA: Diagnosis not present

## 2012-07-21 DIAGNOSIS — C779 Secondary and unspecified malignant neoplasm of lymph node, unspecified: Secondary | ICD-10-CM

## 2012-07-21 DIAGNOSIS — C3490 Malignant neoplasm of unspecified part of unspecified bronchus or lung: Secondary | ICD-10-CM

## 2012-07-21 LAB — CBC WITH DIFFERENTIAL/PLATELET
Basophils Absolute: 0.1 10*3/uL (ref 0.0–0.1)
Eosinophils Absolute: 0.1 10*3/uL (ref 0.0–0.5)
HGB: 13.5 g/dL (ref 11.6–15.9)
LYMPH%: 25.6 % (ref 14.0–49.7)
MCV: 88.8 fL (ref 79.5–101.0)
MONO%: 11.2 % (ref 0.0–14.0)
NEUT#: 4.5 10*3/uL (ref 1.5–6.5)
Platelets: 380 10*3/uL (ref 145–400)
RDW: 14.3 % (ref 11.2–14.5)

## 2012-07-21 LAB — COMPREHENSIVE METABOLIC PANEL (CC13)
Albumin: 3.6 g/dL (ref 3.5–5.0)
Alkaline Phosphatase: 85 U/L (ref 40–150)
BUN: 12.2 mg/dL (ref 7.0–26.0)
CO2: 24 mEq/L (ref 22–29)
Calcium: 10 mg/dL (ref 8.4–10.4)
Glucose: 128 mg/dl (ref 70–140)
Potassium: 4 mEq/L (ref 3.5–5.1)

## 2012-07-21 MED ORDER — ALPRAZOLAM 0.25 MG PO TABS: 0.2500 mg | ORAL_TABLET | Freq: Every evening | ORAL | Status: DC | PRN

## 2012-07-21 MED ORDER — ONDANSETRON 8 MG/50ML IVPB (CHCC)
8.0000 mg | Freq: Once | INTRAVENOUS | Status: AC
  Administered 2012-07-21: 8 mg via INTRAVENOUS

## 2012-07-21 MED ORDER — SODIUM CHLORIDE 0.9 % IJ SOLN
10.0000 mL | INTRAMUSCULAR | Status: DC | PRN
  Administered 2012-07-21: 10 mL
  Filled 2012-07-21: qty 10

## 2012-07-21 MED ORDER — HEPARIN SOD (PORK) LOCK FLUSH 100 UNIT/ML IV SOLN
500.0000 [IU] | Freq: Once | INTRAVENOUS | Status: AC | PRN
  Administered 2012-07-21: 500 [IU]
  Filled 2012-07-21: qty 5

## 2012-07-21 MED ORDER — SODIUM CHLORIDE 0.9 % IV SOLN
500.0000 mg/m2 | Freq: Once | INTRAVENOUS | Status: AC
  Administered 2012-07-21: 900 mg via INTRAVENOUS
  Filled 2012-07-21: qty 36

## 2012-07-21 MED ORDER — DEXAMETHASONE SODIUM PHOSPHATE 10 MG/ML IJ SOLN
10.0000 mg | Freq: Once | INTRAMUSCULAR | Status: AC
  Administered 2012-07-21: 10 mg via INTRAVENOUS

## 2012-07-21 MED ORDER — CYANOCOBALAMIN 1000 MCG/ML IJ SOLN: 1000.0000 ug | Freq: Once | INTRAMUSCULAR | Status: DC

## 2012-07-21 MED ORDER — MORPHINE SULFATE 30 MG PO TABS: 30.0000 mg | ORAL_TABLET | ORAL | Status: DC | PRN

## 2012-07-21 MED ORDER — SODIUM CHLORIDE 0.9 % IV SOLN
Freq: Once | INTRAVENOUS | Status: AC
  Administered 2012-07-21: 11:00:00 via INTRAVENOUS

## 2012-07-21 MED ORDER — MORPHINE SULFATE ER 60 MG PO TBCR: 60.0000 mg | EXTENDED_RELEASE_TABLET | Freq: Two times a day (BID) | ORAL | Status: DC

## 2012-07-21 NOTE — Progress Notes (Signed)
[facility] [phone]   [phone]  OFFICE PROGRESS NOTE  [name], MD [address] [place] [address]  Diagnosis:  Metastatic non-small cell lung cancer initially diagnosed with locally advanced stage IIIB with right Pancoast tumor involving the vertebral body as well as the foraminal canal invasion with spinal cord compression in October of 2007.   Prior Therapy:  1. Status post concurrent chemoradiation with weekly carboplatin and paclitaxel, last dose was given November 18, 2005. 2. 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 3. Status post gamma knife radiotherapy to a solitary brain lesion located in the superior frontal area of the brain at [facility] in April of 2008. 4. Status post palliative radiotherapy to the lateral abdominal wall metastatic lesion under the care of Dr. Mitzi Hansen, completed March of 2009. 5. Status post 6 cycles of systemic chemotherapy with carboplatin and Alimta. Last dose was given July 26, 2007 with disease stabilization. 6. Gamma knife stereotactic radiotherapy to 2 brain lesions one involving the right frontal dural based as well as right parietal lesion performed on 05/07/2012 under the care of Dr. Atlee Abide at [facility].  Current Therapy:  Maintenance chemotherapy with Alimta 500 mg per meter squared given every 3 weeks. The patient is status post 84 cycles.   INTERVAL HISTORY: Danielle [name] 62 y.o. female returns to the clinic today for followup visit accompanied her sister. The patient is feeling fine today with no specific complaints. She denied having any significant fatigue or weakness. She has no nausea or vomiting. No significant fever or chills. The patient denied having any significant chest pain, shortness breath, cough or hemoptysis. She is tolerating her treatment  with maintenance Alimta fairly well with no significant adverse effects.  MEDICAL HISTORY: Past Medical History  Diagnosis Date  . Hypertension   . Anemia   . H/O: pneumonia   . History of tobacco abuse quit 10/08    on nicotine patch  . Fibromyalgia   . Hypokalemia   . DJD (degenerative joint disease), cervical   . Thrush 12/11/2010  . 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 had chemo and radiation for lung ca    ALLERGIES:  is allergic to contrast media; co-trimoxazole injection; iohexol; and sulfa drugs cross reactors.  MEDICATIONS:  Current Outpatient Prescriptions  Medication Sig Dispense Refill  . ALPRAZolam (XANAX) 0.25 MG tablet Take 1 tablet (0.25 mg total) by mouth at bedtime as needed for sleep.  30 tablet  0  . amLODipine (NORVASC) 10 MG tablet Take 1 tablet (10 mg total) by mouth daily.  90 tablet  3  . antipyrine-benzocaine (AURALGAN) otic solution Place 1 drop into both ears daily.      . fluticasone (FLONASE) 50 MCG/ACT nasal spray Place 1-2 sprays into the nose daily.  16 g  1  . folic acid (FOLVITE) 1 MG tablet Take 1 tablet (1 mg total) by mouth daily.  90 tablet  3  . lidocaine-prilocaine (EMLA) cream Apply topically as needed.  30 g  1  . lisinopril (PRINIVIL,ZESTRIL) 5 MG tablet TAKE 1 TABLET BY MOUTH EVERY DAY  90 tablet  0  . meclizine (ANTIVERT) 12.5 MG tablet Take 1-2 tablets (12.5-25 mg total) by mouth 3 (three) times daily as needed.  30 tablet  1  . morphine (MS CONTIN) 60  MG 12 hr tablet Take 1 tablet (60 mg total) by mouth 2 (two) times daily.  60 tablet  0  . morphine (MSIR) 30 MG tablet Take 1 tablet (30 mg total) by mouth every 4 (four) hours as needed. Take 1 tab every 4-6 hours as needed for breakthrough pain  60 tablet  0  . ondansetron (ZOFRAN) 8 MG tablet TAKE 1 TABLET (8 MG TOTAL) BY MOUTH EVERY 8 (EIGHT) HOURS AS NEEDED FOR NAUSEA.  30 tablet  2  . PATANOL 0.1 % ophthalmic solution PUT 1 DROP IN AFFECTED EYE(S) 2X  A DAY  5 mL  1  . predniSONE (DELTASONE) 50 MG tablet 13 hrs, 7 hrs, and 1 hr before scheduled CT scans       No current facility-administered medications for this visit.    SURGICAL HISTORY:  Past Surgical History  Procedure Laterality Date  . Cervical laminectomy  1995  . Other surgical history  2008    Gamma knife surgery to remove brain met   . 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 Knee surgery  1990    Left x 2  . Portacath placement  -[name]    TIP IN CAVOATRIAL JUNCTION    REVIEW OF SYSTEMS:  A comprehensive review of systems was negative.   PHYSICAL EXAMINATION: General appearance: alert, cooperative and no distress Head: Normocephalic, without obvious abnormality, atraumatic Neck: no adenopathy Lymph nodes: Cervical, supraclavicular, and axillary nodes normal. Resp: clear to auscultation bilaterally Cardio: regular rate and rhythm, S1, S2 normal, no murmur, click, rub or gallop GI: soft, non-tender; bowel sounds normal; no masses,  no organomegaly Extremities: extremities normal, atraumatic, no cyanosis or edema  ECOG PERFORMANCE STATUS: 0 - Asymptomatic  Blood pressure 136/74, pulse 93, temperature 96.5 F (35.8 C), temperature source Oral, resp. rate 18, height 5\' 4"  (1.626 m), weight 156 lb 9.6 oz (71.033 kg).  LABORATORY DATA: Lab Results  Component Value Date   WBC 7.4 07/21/2012   HGB 13.5 07/21/2012   HCT 39.8 07/21/2012   MCV 88.8 07/21/2012   PLT 380 07/21/2012      Chemistry      Component Value Date/Time   NA 141 06/30/2012 1004   NA 135 08/20/2011 0953   K 3.7 06/30/2012 1004   K 4.4 08/20/2011 0953   CL 106 06/30/2012 1004   CL 105 08/20/2011 0953   CO2 25 06/30/2012 1004   CO2 28 08/20/2011 0953   BUN 6.8* 06/30/2012 1004   BUN 13 08/20/2011 0953   CREATININE 0.8 06/30/2012 1004   CREATININE 0.80 08/20/2011 0953      Component Value Date/Time   CALCIUM 9.8 06/30/2012 1004   CALCIUM 9.4 08/20/2011 0953   ALKPHOS 77  06/30/2012 1004   ALKPHOS 73 08/20/2011 0953   AST 14 06/30/2012 1004   AST 16 08/20/2011 0953   ALT 8 06/30/2012 1004   ALT 9 08/20/2011 0953   BILITOT 0.36 06/30/2012 1004   BILITOT 0.5 08/20/2011 0953       RADIOGRAPHIC STUDIES: No results found.  ASSESSMENT AND PLAN: This is a very pleasant 62 years old white female with metastatic non-small cell lung cancer currently on maintenance chemotherapy with single agent Alimta status post 84 cycles. The patient is feeling fine with no significant adverse of her treatment. I recommended for her to proceed with cycle #85 today as scheduled. The patient would come back for followup visit in 3  weeks with repeat CT scan of the chest, abdomen and pelvis for restaging of her disease. She was given a refill of her pain medication today. She was advised to call immediately she has any concerning symptoms in the interval.  All questions were answered. The patient knows to call the clinic with any problems, questions or concerns. We can certainly see the patient much sooner if necessary.

## 2012-07-21 NOTE — Patient Instructions (Addendum)
[facility] Discharge Instructions for Patients Receiving Chemotherapy  Today you received the following chemotherapy agents: alimta  To help prevent nausea and vomiting after your treatment, we encourage you to take your nausea medication.  Take it as often as prescribed.     If you develop nausea and vomiting that is not controlled by your nausea medication, call the clinic. If it is after clinic hours your family physician or the after hours number for the clinic or go to the Emergency Department.   BELOW ARE SYMPTOMS THAT SHOULD BE REPORTED IMMEDIATELY:  *FEVER GREATER THAN 100.5 F  *CHILLS WITH OR WITHOUT FEVER  NAUSEA AND VOMITING THAT IS NOT CONTROLLED WITH YOUR NAUSEA MEDICATION  *UNUSUAL SHORTNESS OF BREATH  *UNUSUAL BRUISING OR BLEEDING  TENDERNESS IN MOUTH AND THROAT WITH OR WITHOUT PRESENCE OF ULCERS  *URINARY PROBLEMS  *BOWEL PROBLEMS  UNUSUAL RASH Items with * indicate a potential emergency and should be followed up as soon as possible.  Feel free to call the clinic you have any questions or concerns. The clinic phone number is [phone].   I have been informed and understand all the instructions given to me. I know to contact the clinic, my physician, or go to the Emergency Department if any problems should occur. I do not have any questions at this time, but understand that I may call the clinic during office hours   should I have any questions or need assistance in obtaining follow up care.    __________________________________________  _____________  __________ Signature of Patient or Authorized Representative            Date                   Time    __________________________________________ Nurse's Signature    

## 2012-07-21 NOTE — Telephone Encounter (Signed)
gv andprinted appt sched and avs for pt...MW added tx...gv pt barium  °

## 2012-07-21 NOTE — Telephone Encounter (Signed)
Per staff message and POF I have scheduled appts.  JMW  

## 2012-07-21 NOTE — Patient Instructions (Signed)
Continue maintenance chemotherapy today as scheduled.  Followup visit in 3 weeks.

## 2012-07-22 ENCOUNTER — Other Ambulatory Visit: Payer: Medicare Other

## 2012-07-22 ENCOUNTER — Telehealth: Payer: Self-pay | Admitting: Family Medicine

## 2012-07-22 DIAGNOSIS — E7800 Pure hypercholesterolemia, unspecified: Secondary | ICD-10-CM

## 2012-07-22 DIAGNOSIS — E119 Type 2 diabetes mellitus without complications: Secondary | ICD-10-CM

## 2012-07-22 NOTE — Telephone Encounter (Signed)
Message copied by Excell Seltzer on Wed Jul 22, 2012  2:48 AM ------      Message from: Alvina Chou      Created: Tue Jul 21, 2012  4:54 PM      Regarding: labs for tomorrow       Labs for a f/u visit ------

## 2012-07-24 ENCOUNTER — Other Ambulatory Visit (INDEPENDENT_AMBULATORY_CARE_PROVIDER_SITE_OTHER): Payer: Medicare Other

## 2012-07-24 DIAGNOSIS — E119 Type 2 diabetes mellitus without complications: Secondary | ICD-10-CM | POA: Diagnosis not present

## 2012-07-24 DIAGNOSIS — E7800 Pure hypercholesterolemia, unspecified: Secondary | ICD-10-CM | POA: Diagnosis not present

## 2012-07-24 LAB — LDL CHOLESTEROL, DIRECT: Direct LDL: 164.1 mg/dL

## 2012-07-30 ENCOUNTER — Other Ambulatory Visit: Payer: Medicare Other

## 2012-08-06 ENCOUNTER — Ambulatory Visit (INDEPENDENT_AMBULATORY_CARE_PROVIDER_SITE_OTHER): Payer: Medicare Other | Admitting: Family Medicine

## 2012-08-06 ENCOUNTER — Encounter: Payer: Self-pay | Admitting: Family Medicine

## 2012-08-06 VITALS — BP 108/60 | HR 82 | Temp 98.3°F | Wt 158.0 lb

## 2012-08-06 DIAGNOSIS — I10 Essential (primary) hypertension: Secondary | ICD-10-CM | POA: Diagnosis not present

## 2012-08-06 DIAGNOSIS — E7800 Pure hypercholesterolemia, unspecified: Secondary | ICD-10-CM

## 2012-08-06 DIAGNOSIS — E119 Type 2 diabetes mellitus without complications: Secondary | ICD-10-CM | POA: Diagnosis not present

## 2012-08-06 LAB — HM DIABETES FOOT EXAM

## 2012-08-06 MED ORDER — FLUTICASONE PROPIONATE 50 MCG/ACT NA SUSP: 1.0000 | Freq: Every day | NASAL | Status: DC

## 2012-08-06 MED ORDER — OLOPATADINE HCL 0.1 % OP SOLN: 1.0000 [drp] | Freq: Two times a day (BID) | OPHTHALMIC | Status: DC

## 2012-08-06 NOTE — Progress Notes (Signed)
Subjective:    Patient ID: Danielle [name], female    DOB: [birth year], 62 y.o.   MRN: [id number]  HPI  62 year old female with metastatic lung cancer previously responding well to longterm chemo , DM, HTN  And high cholesterol presents for 6 follow month up.  Lung cancer, metastatic On 85 chemo cycle!!!.Marland Kitchen She had two brain tumors removed with gamma knife in 05/2012 Sees ONC regularly. Chemo recieves every 3 weeks.  CT scan of lungs and abdomen/pelvis stable at last check: she has a repeat scan scheduled.  Pain well controlled with morphine..some constipation..hard stool.  Sleeping well. Using alprazolam.   She did have labs done on a week of chemo... Was on steroid. Was not able to keep down cholesterol med.  Diabetes: Well controlled with diet  Lab Results  Component Value Date   HGBA1C 6.2 07/24/2012  Using medications without difficulties:  Hypoglycemic episodes: None  Hyperglycemic episodes:None  Feet problems: No ulcers  Blood Sugars averaging: not checking at home.  eye exam within last year: 01/2011   Hypertension: Well controlled on amlodipine and lisinopril  Using medication without problems or lightheadedness: None  Chest pain with exertion:None  Edema:Occ  Short of breath: Yes, but stable  Average home BPs: Not checking at home. Other issues:   Elevated Cholesterol: Previousyl well controlled on zocor.  She had vomited up with chemo the week labs were done... And was on prednisone. Lab Results  Component Value Date   CHOL 222* 07/24/2012   HDL 34.00* 07/24/2012   LDLCALC 71 01/28/2012   LDLDIRECT 164.1 07/24/2012   TRIG 232.0* 07/24/2012   CHOLHDL 7 07/24/2012   Using medications without problems:Yes  Muscle aches: None  Other complaints:     Review of Systems  Constitutional: Negative for fever and fatigue.  HENT: Negative for congestion.   Eyes: Negative for pain.  Respiratory: Positive for shortness of breath. Negative for cough.   Cardiovascular:  Positive for leg swelling.  Gastrointestinal: Negative for abdominal pain.       Objective:   Physical Exam  Constitutional: Vital signs are normal. She appears well-developed and well-nourished. She is cooperative.  Non-toxic appearance. She does not appear ill. No distress.  HENT:  Head: Normocephalic.  Right Ear: Hearing, tympanic membrane, external ear and ear canal normal. Tympanic membrane is not erythematous, not retracted and not bulging.  Left Ear: Hearing, tympanic membrane, external ear and ear canal normal. Tympanic membrane is not erythematous, not retracted and not bulging.  Nose: No mucosal edema or rhinorrhea. Right sinus exhibits no maxillary sinus tenderness and no frontal sinus tenderness. Left sinus exhibits no maxillary sinus tenderness and no frontal sinus tenderness.  Mouth/Throat: Uvula is midline, oropharynx is clear and moist and mucous membranes are normal.  Eyes: Conjunctivae, EOM and lids are normal. Pupils are equal, round, and reactive to light. No foreign bodies found.  Neck: Trachea normal and normal range of motion. Neck supple. Carotid bruit is not present. No mass and no thyromegaly present.  Cardiovascular: Normal rate, regular rhythm, S1 normal, S2 normal, normal heart sounds, intact distal pulses and normal pulses.  Exam reveals no gallop and no friction rub.   No murmur heard. Pulmonary/Chest: Effort normal. Not tachypneic. No respiratory distress. She has decreased breath sounds. She has no wheezes. She has no rhonchi. She has no rales.  Unchanged from previous exam  Abdominal: Soft. Normal appearance and bowel sounds are normal. There is no tenderness.  Neurological: She is alert.  Skin: Skin is warm, dry and intact. No rash noted.  Psychiatric: Her speech is normal and behavior is normal. Judgment and thought content normal. Her mood appears not anxious. Cognition and memory are normal. She does not exhibit a depressed mood.    Diabetic foot  exam: Normal inspection No skin breakdown No calluses  Normal DP pulses Normal sensation to light touch and monofilament Nails normal       Assessment & Plan:

## 2012-08-06 NOTE — Patient Instructions (Addendum)
Follow BP at home.. Call if dizzy with standing or BP less than 90/60.

## 2012-08-06 NOTE — Assessment & Plan Note (Signed)
Boredeline low today... Will follow at home and on chemo. If symptomatic...consider decreasing one of her medications.

## 2012-08-06 NOTE — Assessment & Plan Note (Signed)
Well controlled on no med. 

## 2012-08-06 NOTE — Assessment & Plan Note (Signed)
Previously excellently controlled on zocor. Poor control this measurment but she was unable to keep down any med prior to blood tests for 1 week given chemo. Will re-eval in 6 months.

## 2012-08-07 ENCOUNTER — Ambulatory Visit (HOSPITAL_COMMUNITY)
Admission: RE | Admit: 2012-08-07 | Discharge: 2012-08-07 | Disposition: A | Discharge status: Home / Self Care | Payer: Medicare Other | Source: Ambulatory Visit | Attending: Internal Medicine | Admitting: Internal Medicine

## 2012-08-07 ENCOUNTER — Encounter (HOSPITAL_COMMUNITY): Payer: Self-pay

## 2012-08-07 DIAGNOSIS — C3490 Malignant neoplasm of unspecified part of unspecified bronchus or lung: Secondary | ICD-10-CM | POA: Diagnosis not present

## 2012-08-07 DIAGNOSIS — C7952 Secondary malignant neoplasm of bone marrow: Secondary | ICD-10-CM | POA: Diagnosis not present

## 2012-08-07 DIAGNOSIS — C7951 Secondary malignant neoplasm of bone: Secondary | ICD-10-CM | POA: Diagnosis not present

## 2012-08-07 DIAGNOSIS — R091 Pleurisy: Secondary | ICD-10-CM | POA: Diagnosis not present

## 2012-08-07 DIAGNOSIS — X58XXXA Exposure to other specified factors, initial encounter: Secondary | ICD-10-CM | POA: Insufficient documentation

## 2012-08-07 DIAGNOSIS — S22009A Unspecified fracture of unspecified thoracic vertebra, initial encounter for closed fracture: Secondary | ICD-10-CM | POA: Diagnosis not present

## 2012-08-07 DIAGNOSIS — C7931 Secondary malignant neoplasm of brain: Secondary | ICD-10-CM | POA: Diagnosis not present

## 2012-08-07 DIAGNOSIS — C7949 Secondary malignant neoplasm of other parts of nervous system: Secondary | ICD-10-CM | POA: Insufficient documentation

## 2012-08-07 MED ORDER — IOHEXOL 300 MG/ML  SOLN
100.0000 mL | Freq: Once | INTRAMUSCULAR | Status: AC | PRN
  Administered 2012-08-07: 100 mL via INTRAVENOUS

## 2012-08-11 ENCOUNTER — Other Ambulatory Visit: Payer: Medicare Other | Admitting: Lab

## 2012-08-11 ENCOUNTER — Other Ambulatory Visit (HOSPITAL_BASED_OUTPATIENT_CLINIC_OR_DEPARTMENT_OTHER): Payer: Medicare Other | Admitting: Lab

## 2012-08-11 ENCOUNTER — Ambulatory Visit (HOSPITAL_BASED_OUTPATIENT_CLINIC_OR_DEPARTMENT_OTHER): Payer: Medicare Other

## 2012-08-11 ENCOUNTER — Telehealth: Payer: Self-pay | Admitting: Internal Medicine

## 2012-08-11 ENCOUNTER — Encounter: Payer: Self-pay | Admitting: Internal Medicine

## 2012-08-11 ENCOUNTER — Ambulatory Visit (HOSPITAL_BASED_OUTPATIENT_CLINIC_OR_DEPARTMENT_OTHER): Payer: Medicare Other | Admitting: Internal Medicine

## 2012-08-11 DIAGNOSIS — C3490 Malignant neoplasm of unspecified part of unspecified bronchus or lung: Secondary | ICD-10-CM

## 2012-08-11 DIAGNOSIS — C779 Secondary and unspecified malignant neoplasm of lymph node, unspecified: Secondary | ICD-10-CM

## 2012-08-11 DIAGNOSIS — C50919 Malignant neoplasm of unspecified site of unspecified female breast: Secondary | ICD-10-CM | POA: Diagnosis not present

## 2012-08-11 DIAGNOSIS — C7951 Secondary malignant neoplasm of bone: Secondary | ICD-10-CM | POA: Diagnosis not present

## 2012-08-11 DIAGNOSIS — Z5111 Encounter for antineoplastic chemotherapy: Secondary | ICD-10-CM | POA: Diagnosis not present

## 2012-08-11 DIAGNOSIS — C7949 Secondary malignant neoplasm of other parts of nervous system: Secondary | ICD-10-CM | POA: Diagnosis not present

## 2012-08-11 DIAGNOSIS — C7952 Secondary malignant neoplasm of bone marrow: Secondary | ICD-10-CM

## 2012-08-11 DIAGNOSIS — C7931 Secondary malignant neoplasm of brain: Secondary | ICD-10-CM

## 2012-08-11 DIAGNOSIS — C3410 Malignant neoplasm of upper lobe, unspecified bronchus or lung: Secondary | ICD-10-CM

## 2012-08-11 LAB — COMPREHENSIVE METABOLIC PANEL (CC13)
CO2: 25 mEq/L (ref 22–29)
Creatinine: 0.9 mg/dL (ref 0.6–1.1)
Glucose: 145 mg/dl — ABNORMAL HIGH (ref 70–140)
Total Bilirubin: 0.33 mg/dL (ref 0.20–1.20)

## 2012-08-11 LAB — CBC WITH DIFFERENTIAL/PLATELET
Basophils Absolute: 0 10*3/uL (ref 0.0–0.1)
Eosinophils Absolute: 0.1 10*3/uL (ref 0.0–0.5)
HGB: 13.4 g/dL (ref 11.6–15.9)
LYMPH%: 25.4 % (ref 14.0–49.7)
MCV: 90.1 fL (ref 79.5–101.0)
MONO%: 10.3 % (ref 0.0–14.0)
NEUT#: 6.9 10*3/uL — ABNORMAL HIGH (ref 1.5–6.5)
Platelets: 328 10*3/uL (ref 145–400)
RBC: 4.56 10*6/uL (ref 3.70–5.45)

## 2012-08-11 MED ORDER — HEPARIN SOD (PORK) LOCK FLUSH 100 UNIT/ML IV SOLN
500.0000 [IU] | Freq: Once | INTRAVENOUS | Status: AC | PRN
  Administered 2012-08-11: 500 [IU]
  Filled 2012-08-11: qty 5

## 2012-08-11 MED ORDER — ONDANSETRON 8 MG/50ML IVPB (CHCC)
8.0000 mg | Freq: Once | INTRAVENOUS | Status: AC
  Administered 2012-08-11: 8 mg via INTRAVENOUS

## 2012-08-11 MED ORDER — SODIUM CHLORIDE 0.9 % IJ SOLN
10.0000 mL | INTRAMUSCULAR | Status: DC | PRN
  Administered 2012-08-11: 10 mL
  Filled 2012-08-11: qty 10

## 2012-08-11 MED ORDER — SODIUM CHLORIDE 0.9 % IV SOLN
500.0000 mg/m2 | Freq: Once | INTRAVENOUS | Status: AC
  Administered 2012-08-11: 900 mg via INTRAVENOUS
  Filled 2012-08-11: qty 36

## 2012-08-11 MED ORDER — DEXAMETHASONE SODIUM PHOSPHATE 10 MG/ML IJ SOLN
10.0000 mg | Freq: Once | INTRAMUSCULAR | Status: AC
  Administered 2012-08-11: 10 mg via INTRAVENOUS

## 2012-08-11 MED ORDER — SODIUM CHLORIDE 0.9 % IV SOLN
Freq: Once | INTRAVENOUS | Status: AC
  Administered 2012-08-11: 13:00:00 via INTRAVENOUS

## 2012-08-11 MED ORDER — CYANOCOBALAMIN 1000 MCG/ML IJ SOLN: 1000.0000 ug | Freq: Once | INTRAMUSCULAR | Status: DC

## 2012-08-11 NOTE — Telephone Encounter (Signed)
gv and printed appt sched and avs for pt  °

## 2012-08-11 NOTE — Patient Instructions (Addendum)
Current Therapy:  Maintenance chemotherapy with Alimta 500 mg per meter squared given every 3 weeks. The patient is status post 85 cycles.  CHEMOTHERAPY INTENT: Palliative/maintenance  CURRENT # OF CHEMOTHERAPY CYCLES: 86  CURRENT ANTIEMETICS: Zofran, dexamethasone and Compazine  CURRENT SMOKING STATUS: Quit smoking 10/07/2005  ORAL CHEMOTHERAPY AND CONSENT: None  CURRENT BISPHOSPHONATES USE: None  PAIN MANAGEMENT: 0/10 currently on morphine  NARCOTICS INDUCED CONSTIPATION: No constipation  LIVING WILL AND CODE STATUS: No CODE BLUE

## 2012-08-11 NOTE — Progress Notes (Signed)
[facility] [phone]   [phone]  OFFICE PROGRESS NOTE  [name], MD [address] [place] [address]  Diagnosis:  Metastatic non-small cell lung cancer initially diagnosed with locally advanced stage IIIB with right Pancoast tumor involving the vertebral body as well as the foraminal canal invasion with spinal cord compression in October of 2007.   Prior Therapy:  1. Status post concurrent chemoradiation with weekly carboplatin and paclitaxel, last dose was given November 18, 2005. 2. 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 3. Status post gamma knife radiotherapy to a solitary brain lesion located in the superior frontal area of the brain at [facility] in April of 2008. 4. Status post palliative radiotherapy to the lateral abdominal wall metastatic lesion under the care of Dr. Mitzi Hansen, completed March of 2009. 5. Status post 6 cycles of systemic chemotherapy with carboplatin and Alimta. Last dose was given July 26, 2007 with disease stabilization. 6. Gamma knife stereotactic radiotherapy to 2 brain lesions one involving the right frontal dural based as well as right parietal lesion performed on 05/07/2012 under the care of Dr. Atlee Abide at [facility]  Current Therapy:  Maintenance chemotherapy with Alimta 500 mg per meter squared given every 3 weeks. The patient is status post 85 cycles.  CHEMOTHERAPY INTENT: Palliative/maintenance  CURRENT # OF CHEMOTHERAPY CYCLES: 86  CURRENT ANTIEMETICS: Zofran, dexamethasone and Compazine  CURRENT SMOKING STATUS: Quit smoking 10/07/2005  ORAL CHEMOTHERAPY AND CONSENT: None  CURRENT BISPHOSPHONATES USE: None  PAIN MANAGEMENT: 0/10 currently on morphine  NARCOTICS INDUCED CONSTIPATION: No constipation  LIVING WILL AND CODE STATUS: No CODE BLUE  INTERVAL  HISTORY: Danielle [name] 62 y.o. female returns to the clinic today for followup visit accompanied by her sister. The patient is feeling fine today with no specific complaints. She is tolerating her treatment with maintenance Alimta fairly well with no significant adverse effects. The patient denied having any nausea or vomiting. She has no chest pain, shortness breath, cough or hemoptysis. She has no fatigue weakness. She had repeat CT scan of the chest, abdomen and pelvis performed recently and she is here for evaluation and discussion of her scan results.  MEDICAL HISTORY: Past Medical History  Diagnosis Date  . Hypertension   . Anemia   . H/O: pneumonia   . History of tobacco abuse quit 10/08    on nicotine patch  . Fibromyalgia   . Hypokalemia   . DJD (degenerative joint disease), cervical   . Thrush 12/11/2010  . 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 had chemo and radiation for lung ca    ALLERGIES:  is allergic to contrast media; co-trimoxazole injection; iohexol; and sulfa drugs cross reactors.  MEDICATIONS:  Current Outpatient Prescriptions  Medication Sig Dispense Refill  . ALPRAZolam (XANAX) 0.25 MG tablet Take 1 tablet (0.25 mg total) by mouth at bedtime as needed for sleep.  30 tablet  0  . amLODipine (NORVASC) 10 MG tablet Take 1 tablet (10 mg total) by mouth daily.  90 tablet  3  . antipyrine-benzocaine (AURALGAN) otic solution Place 1 drop into both ears daily.      . fluticasone (FLONASE) 50 MCG/ACT nasal spray Place 1-2 sprays into the nose daily.  16 g  11  . folic acid (FOLVITE) 1 MG tablet Take 1 tablet (1 mg  total) by mouth daily.  90 tablet  3  . lidocaine-prilocaine (EMLA) cream Apply topically as needed.  30 g  1  . lisinopril (PRINIVIL,ZESTRIL) 5 MG tablet TAKE 1 TABLET BY MOUTH EVERY DAY  90 tablet  0  . meclizine (ANTIVERT) 12.5 MG tablet Take 1-2 tablets (12.5-25 mg total) by mouth 3 (three) times daily as needed.  30 tablet   1  . morphine (MS CONTIN) 60 MG 12 hr tablet Take 1 tablet (60 mg total) by mouth 2 (two) times daily.  60 tablet  0  . morphine (MSIR) 30 MG tablet Take 30 mg by mouth every 4 (four) hours as needed.      Marland Kitchen olopatadine (PATANOL) 0.1 % ophthalmic solution Place 1 drop into both eyes 2 (two) times daily.  5 mL  11  . ondansetron (ZOFRAN) 8 MG tablet TAKE 1 TABLET (8 MG TOTAL) BY MOUTH EVERY 8 (EIGHT) HOURS AS NEEDED FOR NAUSEA.  30 tablet  2  . predniSONE (DELTASONE) 50 MG tablet 13 hrs, 7 hrs, and 1 hr before scheduled CT scans       No current facility-administered medications for this visit.    SURGICAL HISTORY:  Past Surgical History  Procedure Laterality Date  . Cervical laminectomy  1995  . Other surgical history  2008    Gamma knife surgery to remove brain met   . 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 Knee surgery  1990    Left x 2  . Portacath placement  -[name]    TIP IN CAVOATRIAL JUNCTION    REVIEW OF SYSTEMS:  A comprehensive review of systems was negative.   PHYSICAL EXAMINATION: General appearance: alert, cooperative and no distress Head: Normocephalic, without obvious abnormality, atraumatic Neck: no adenopathy Lymph nodes: Cervical, supraclavicular, and axillary nodes normal. Resp: clear to auscultation bilaterally Cardio: regular rate and rhythm, S1, S2 normal, no murmur, click, rub or gallop GI: soft, non-tender; bowel sounds normal; no masses,  no organomegaly Extremities: extremities normal, atraumatic, no cyanosis or edema Neurologic: Alert and oriented X 3, normal strength and tone. Normal symmetric reflexes. Normal coordination and gait  ECOG PERFORMANCE STATUS: 0 - Asymptomatic  Blood pressure 119/72, pulse 101, temperature 97.2 F (36.2 C), temperature source Oral, resp. rate 18, height 5\' 4"  (1.626 m), weight 160 lb 3.2 oz (72.666 kg).  LABORATORY DATA: Lab Results  Component Value Date   WBC 10.9* 08/11/2012   HGB  13.4 08/11/2012   HCT 41.1 08/11/2012   MCV 90.1 08/11/2012   PLT 328 08/11/2012      Chemistry      Component Value Date/Time   NA 141 07/21/2012 0952   NA 135 08/20/2011 0953   K 4.0 07/21/2012 0952   K 4.4 08/20/2011 0953   CL 106 06/30/2012 1004   CL 105 08/20/2011 0953   CO2 24 07/21/2012 0952   CO2 28 08/20/2011 0953   BUN 12.2 07/21/2012 0952   BUN 13 08/20/2011 0953   CREATININE 0.8 07/21/2012 0952   CREATININE 0.80 08/20/2011 0953      Component Value Date/Time   CALCIUM 10.0 07/21/2012 0952   CALCIUM 9.4 08/20/2011 0953   ALKPHOS 85 07/21/2012 0952   ALKPHOS 73 08/20/2011 0953   AST 17 07/21/2012 0952   AST 16 08/20/2011 0953   ALT 13 07/21/2012 0952   ALT 9 08/20/2011 0953   BILITOT 0.27 07/21/2012 0952   BILITOT 0.5 08/20/2011  1610       RADIOGRAPHIC STUDIES: Ct Chest W Contrast  08/07/2012   *RADIOLOGY REPORT*  Clinical Data:  Lung cancer with brain metastasis.  Chemotherapy ongoing.  CT CHEST, ABDOMEN AND PELVIS WITH CONTRAST  Technique:  Multidetector CT imaging of the chest, abdomen and pelvis was performed following the standard protocol during bolus administration of intravenous contrast.  Contrast:  100 ml Omnipaque 300  Comparison:  CT 06/05/2012    CT CHEST  Findings:  Port right anterior chest wall.  No axillary supraclavicular lymphadenopathy.  No mediastinal or hilar lymphadenopathy.  No pericardial fluid.  Esophagus is normal.  Review of the lung parenchyma demonstrates nodular pleural focal thickening at the medial right lung base measuring 21 x 18 mm similar to 21 x 18 mm on prior.  The second focus of nodular pleural thickening anteriorly in the right upper lobe measuring 10 x 6 mm also unchanged.  There is linear interstitial thickening in the right lung apex.  These findings likely relate to radiation therapy.  IMPRESSION:  1.  No evidence of lung cancer recurrence. 2.  Stable nodular pleural parenchymal thickening at the right lung apex.  This likely relates to radiation  therapy.    CT ABDOMEN AND PELVIS  Findings:  No focal hepatic lesion.  The gallbladder, pancreas, spleen, adrenal glands, and kidneys are normal.  The stomach, small bowel, appendix, and cecum normal.  The colon and the rectosigmoid colon are normal.  Abdominal aorta normal caliber.  No retroperitoneal periportal lymphadenopathy.  No evidence of peritoneal disease or mesenteric disease.  The bladder and uterus and ovaries are normal.  No pelvic lymphadenopathy. Review of  bone windows demonstrates no aggressive osseous lesions.  Stable vertebral plasty augmentation in the upper mid thoracic spine and severe compression fracture T3.  IMPRESSION:  1.  No evidence of metastasis in the abdomen or pelvis. 2.  Stable augmentation and compression fracture of the thoracic spine.   Original Report Authenticated By: Genevive Bi, M.D.   ASSESSMENT AND PLAN: This is a very pleasant 62 years old white female with metastatic non-small cell lung cancer currently undergoing maintenance chemotherapy with single agent Alimta status post 85 cycles. The patient is doing fine and she has no evidence for disease progression on his recent scan.  I discussed the scan results with the patient and her sister. I recommended for her to continue on maintenance treatment with Alimta as scheduled.  The patient would come back for followup visit in 3 weeks with the next cycle of her chemotherapy. She was advised to call immediately if she has any concerning symptoms in the interval.  The patient voices understanding of current disease status and treatment options and is in agreement with the current care plan.  All questions were answered. The patient knows to call the clinic with any problems, questions or concerns. We can certainly see the patient much sooner if necessary.  I spent 15 minutes counseling the patient face to face. The total time spent in the appointment was 25 minutes.

## 2012-08-11 NOTE — Patient Instructions (Addendum)
[facility] Discharge Instructions for Patients Receiving Chemotherapy  Today you received the following chemotherapy agents :  Alimta.  To help prevent nausea and vomiting after your treatment, we encourage you to take your nausea medication as instructed by your physician.   If you develop nausea and vomiting that is not controlled by your nausea medication, call the clinic.   BELOW ARE SYMPTOMS THAT SHOULD BE REPORTED IMMEDIATELY:  *FEVER GREATER THAN 100.5 F  *CHILLS WITH OR WITHOUT FEVER  NAUSEA AND VOMITING THAT IS NOT CONTROLLED WITH YOUR NAUSEA MEDICATION  *UNUSUAL SHORTNESS OF BREATH  *UNUSUAL BRUISING OR BLEEDING  TENDERNESS IN MOUTH AND THROAT WITH OR WITHOUT PRESENCE OF ULCERS  *URINARY PROBLEMS  *BOWEL PROBLEMS  UNUSUAL RASH Items with * indicate a potential emergency and should be followed up as soon as possible.  Feel free to call the clinic you have any questions or concerns. The clinic phone number is [phone].    

## 2012-08-14 ENCOUNTER — Telehealth: Payer: Self-pay | Admitting: *Deleted

## 2012-08-14 NOTE — Telephone Encounter (Signed)
Pt calling stating that her BP is still running low, 98/60, pt is dizzy and would like to stop one of her BP meds please advise

## 2012-08-14 NOTE — Telephone Encounter (Signed)
Stop amlodipine. Follow BP closely Continue lisinopril as it is protective of kidneys. Call if BPs are > 130/80 or if they remain low.

## 2012-08-14 NOTE — Telephone Encounter (Signed)
Spoke with patient and advised results, she will call if anything changes or worsens

## 2012-08-20 ENCOUNTER — Other Ambulatory Visit: Payer: Self-pay | Admitting: Physician Assistant

## 2012-08-20 ENCOUNTER — Other Ambulatory Visit: Payer: Self-pay | Admitting: *Deleted

## 2012-08-20 MED ORDER — MORPHINE SULFATE 30 MG PO TABS: 30.0000 mg | ORAL_TABLET | ORAL | Status: DC | PRN

## 2012-08-20 MED ORDER — MORPHINE SULFATE ER 60 MG PO TBCR: 60.0000 mg | EXTENDED_RELEASE_TABLET | Freq: Two times a day (BID) | ORAL | Status: DC

## 2012-08-20 MED ORDER — ALPRAZOLAM 0.25 MG PO TABS: 0.2500 mg | ORAL_TABLET | Freq: Every evening | ORAL | Status: DC | PRN

## 2012-08-21 ENCOUNTER — Other Ambulatory Visit: Payer: Self-pay | Admitting: Family Medicine

## 2012-09-01 ENCOUNTER — Telehealth: Payer: Self-pay | Admitting: Internal Medicine

## 2012-09-01 ENCOUNTER — Ambulatory Visit (HOSPITAL_BASED_OUTPATIENT_CLINIC_OR_DEPARTMENT_OTHER): Payer: Medicare Other

## 2012-09-01 ENCOUNTER — Telehealth: Payer: Self-pay | Admitting: *Deleted

## 2012-09-01 ENCOUNTER — Other Ambulatory Visit (HOSPITAL_BASED_OUTPATIENT_CLINIC_OR_DEPARTMENT_OTHER): Payer: Medicare Other | Admitting: Lab

## 2012-09-01 ENCOUNTER — Ambulatory Visit (HOSPITAL_BASED_OUTPATIENT_CLINIC_OR_DEPARTMENT_OTHER): Payer: Medicare Other | Admitting: Physician Assistant

## 2012-09-01 ENCOUNTER — Other Ambulatory Visit: Payer: Medicare Other | Admitting: Lab

## 2012-09-01 ENCOUNTER — Encounter: Payer: Self-pay | Admitting: Physician Assistant

## 2012-09-01 ENCOUNTER — Encounter: Payer: Self-pay | Admitting: *Deleted

## 2012-09-01 DIAGNOSIS — B370 Candidal stomatitis: Secondary | ICD-10-CM

## 2012-09-01 DIAGNOSIS — C3490 Malignant neoplasm of unspecified part of unspecified bronchus or lung: Secondary | ICD-10-CM

## 2012-09-01 DIAGNOSIS — C7931 Secondary malignant neoplasm of brain: Secondary | ICD-10-CM

## 2012-09-01 DIAGNOSIS — Z5111 Encounter for antineoplastic chemotherapy: Secondary | ICD-10-CM | POA: Diagnosis not present

## 2012-09-01 LAB — CBC WITH DIFFERENTIAL/PLATELET
Eosinophils Absolute: 0 10*3/uL (ref 0.0–0.5)
HCT: 37.6 % (ref 34.8–46.6)
LYMPH%: 23.6 % (ref 14.0–49.7)
MONO#: 0.7 10*3/uL (ref 0.1–0.9)
NEUT#: 4.4 10*3/uL (ref 1.5–6.5)
NEUT%: 64.8 % (ref 38.4–76.8)
Platelets: 392 10*3/uL (ref 145–400)
WBC: 6.7 10*3/uL (ref 3.9–10.3)

## 2012-09-01 LAB — COMPREHENSIVE METABOLIC PANEL (CC13)
CO2: 25 mEq/L (ref 22–29)
Calcium: 9.9 mg/dL (ref 8.4–10.4)
Creatinine: 0.7 mg/dL (ref 0.6–1.1)
Glucose: 112 mg/dl (ref 70–140)
Sodium: 145 mEq/L (ref 136–145)
Total Bilirubin: 0.22 mg/dL (ref 0.20–1.20)
Total Protein: 7.3 g/dL (ref 6.4–8.3)

## 2012-09-01 MED ORDER — CYANOCOBALAMIN 1000 MCG/ML IJ SOLN
1000.0000 ug | Freq: Once | INTRAMUSCULAR | Status: AC
  Administered 2012-09-01: 1000 ug via INTRAMUSCULAR

## 2012-09-01 MED ORDER — SODIUM CHLORIDE 0.9 % IV SOLN
500.0000 mg/m2 | Freq: Once | INTRAVENOUS | Status: AC
  Administered 2012-09-01: 900 mg via INTRAVENOUS
  Filled 2012-09-01: qty 36

## 2012-09-01 MED ORDER — HEPARIN SOD (PORK) LOCK FLUSH 100 UNIT/ML IV SOLN
500.0000 [IU] | Freq: Once | INTRAVENOUS | Status: AC | PRN
  Administered 2012-09-01: 500 [IU]
  Filled 2012-09-01: qty 5

## 2012-09-01 MED ORDER — FLUCONAZOLE 100 MG PO TABS: ORAL_TABLET | ORAL | Status: DC

## 2012-09-01 MED ORDER — DEXAMETHASONE SODIUM PHOSPHATE 10 MG/ML IJ SOLN
10.0000 mg | Freq: Once | INTRAMUSCULAR | Status: AC
  Administered 2012-09-01: 10 mg via INTRAVENOUS

## 2012-09-01 MED ORDER — SODIUM CHLORIDE 0.9 % IV SOLN
Freq: Once | INTRAVENOUS | Status: AC
  Administered 2012-09-01: 10:00:00 via INTRAVENOUS

## 2012-09-01 MED ORDER — SODIUM CHLORIDE 0.9 % IJ SOLN
10.0000 mL | INTRAMUSCULAR | Status: DC | PRN
  Administered 2012-09-01: 10 mL
  Filled 2012-09-01: qty 10

## 2012-09-01 MED ORDER — ONDANSETRON 8 MG/50ML IVPB (CHCC)
8.0000 mg | Freq: Once | INTRAVENOUS | Status: AC
  Administered 2012-09-01: 8 mg via INTRAVENOUS

## 2012-09-01 NOTE — Progress Notes (Signed)
09/01/12 @ 9:15 am, BMS [id number], Questionnaires Only:  Danielle [name] into the [facility] for another treatment with pemetrexed maintenance therapy.  She completed both questionnaires prior to any other activities.  No labs were required this visit.  Reviewed forms and all questions were completed.

## 2012-09-01 NOTE — Patient Instructions (Signed)
[facility] Discharge Instructions for Patients Receiving Chemotherapy  Today you received the following chemotherapy agents: alimta  To help prevent nausea and vomiting after your treatment, we encourage you to take your nausea medication.  Take it as often as prescribed.     If you develop nausea and vomiting that is not controlled by your nausea medication, call the clinic. If it is after clinic hours your family physician or the after hours number for the clinic or go to the Emergency Department.   BELOW ARE SYMPTOMS THAT SHOULD BE REPORTED IMMEDIATELY:  *FEVER GREATER THAN 100.5 F  *CHILLS WITH OR WITHOUT FEVER  NAUSEA AND VOMITING THAT IS NOT CONTROLLED WITH YOUR NAUSEA MEDICATION  *UNUSUAL SHORTNESS OF BREATH  *UNUSUAL BRUISING OR BLEEDING  TENDERNESS IN MOUTH AND THROAT WITH OR WITHOUT PRESENCE OF ULCERS  *URINARY PROBLEMS  *BOWEL PROBLEMS  UNUSUAL RASH Items with * indicate a potential emergency and should be followed up as soon as possible.  Feel free to call the clinic you have any questions or concerns. The clinic phone number is [phone].   I have been informed and understand all the instructions given to me. I know to contact the clinic, my physician, or go to the Emergency Department if any problems should occur. I do not have any questions at this time, but understand that I may call the clinic during office hours   should I have any questions or need assistance in obtaining follow up care.    __________________________________________  _____________  __________ Signature of Patient or Authorized Representative            Date                   Time    __________________________________________ Nurse's Signature    

## 2012-09-01 NOTE — Telephone Encounter (Signed)
gave pt appt for lab ML and chemo for September 2014

## 2012-09-01 NOTE — Telephone Encounter (Signed)
Per staff message and POF I have scheduled appts.  JMW  

## 2012-09-04 NOTE — Patient Instructions (Addendum)
Take diflucan as prescribed for your thrush Follow up in 3 weeks, prior to your next scheduled cycle of chemotherapy

## 2012-09-04 NOTE — Progress Notes (Signed)
[facility] Telephone:([phone]   Fax:([phone]  OFFICE PROGRESS NOTE  [name], MD [address] [place] [address]  Diagnosis:  Metastatic non-small cell lung cancer initially diagnosed with locally advanced stage IIIB with right Pancoast tumor involving the vertebral body as well as the foraminal canal invasion with spinal cord compression in October of 2007.   Prior Therapy:  1. Status post concurrent chemoradiation with weekly carboplatin and paclitaxel, last dose was given November 18, 2005. 2. 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 3. Status post gamma knife radiotherapy to a solitary brain lesion located in the superior frontal area of the brain at [facility] in April of 2008. 4. Status post palliative radiotherapy to the lateral abdominal wall metastatic lesion under the care of Dr. Mitzi Hansen, completed March of 2009. 5. Status post 6 cycles of systemic chemotherapy with carboplatin and Alimta. Last dose was given July 26, 2007 with disease stabilization. 6. Gamma knife stereotactic radiotherapy to 2 brain lesions one involving the right frontal dural based as well as right parietal lesion performed on 05/07/2012 under the care of Dr. Atlee Abide at [facility].  Current Therapy:  Maintenance chemotherapy with Alimta 500 mg per meter squared given every 3 weeks. The patient is status post 86 cycles.  CHEMOTHERAPY INTENT: Palliative/maintenance  CURRENT # OF CHEMOTHERAPY CYCLES: 87  CURRENT ANTIEMETICS: Zofran, dexamethasone and Compazine  CURRENT SMOKING STATUS: Quit smoking 10/07/2005  ORAL CHEMOTHERAPY AND CONSENT: None  CURRENT BISPHOSPHONATES USE: None  PAIN MANAGEMENT: 0/10 currently on morphine  NARCOTICS INDUCED CONSTIPATION: No constipation  LIVING WILL AND CODE STATUS: No CODE BLUE  INTERVAL  HISTORY: Alsha Meland Saintvil 62 y.o. female returns to the clinic today for followup visit. She states she's had some cold symptoms over the past few days, not associated with any fever or chills. She initially had some mild diarrhea with the cold symptoms however this has resolved. She complains of decreased appetite and feels that she may have thrush again. She voiced no other specific complaints. She is tolerating her treatment with maintenance Alimta fairly well with no significant adverse effects. The patient denied having any nausea or vomiting. She has no chest pain, shortness breath, cough or hemoptysis. She has no fatigue weakness.   MEDICAL HISTORY: Past Medical History  Diagnosis Date  . Hypertension   . Anemia   . H/O: pneumonia   . History of tobacco abuse quit 10/08    on nicotine patch  . Fibromyalgia   . Hypokalemia   . DJD (degenerative joint disease), cervical   . Thrush 12/11/2010  . 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 had chemo and radiation for lung ca    ALLERGIES:  is allergic to contrast media; co-trimoxazole injection; iohexol; and sulfa drugs cross reactors.  MEDICATIONS:  Current Outpatient Prescriptions  Medication Sig Dispense Refill  . ALPRAZolam (XANAX) 0.25 MG tablet Take 1 tablet (0.25 mg total) by mouth at bedtime as needed for sleep.  30 tablet  0  . antipyrine-benzocaine (AURALGAN) otic solution Place 1 drop into both ears daily.      . fluconazole (DIFLUCAN) 100 MG tablet Take 2 tablets by mouth on day one, then take 1 tablet by mouth daily until completed  16 tablet  0  . fluticasone (FLONASE) 50 MCG/ACT nasal spray Place 1-2 sprays  into the nose daily.  16 g  11  . folic acid (FOLVITE) 1 MG tablet TAKE 1 TABLET BY MOUTH DAILY.  90 tablet  3  . lidocaine-prilocaine (EMLA) cream Apply topically as needed.  30 g  1  . lisinopril (PRINIVIL,ZESTRIL) 5 MG tablet TAKE 1 TABLET BY MOUTH EVERY DAY  90 tablet  0  . meclizine  (ANTIVERT) 12.5 MG tablet Take 1-2 tablets (12.5-25 mg total) by mouth 3 (three) times daily as needed.  30 tablet  1  . morphine (MS CONTIN) 60 MG 12 hr tablet Take 1 tablet (60 mg total) by mouth 2 (two) times daily.  60 tablet  0  . morphine (MSIR) 30 MG tablet Take 1 tablet (30 mg total) by mouth every 4 (four) hours as needed.  30 tablet  0  . olopatadine (PATANOL) 0.1 % ophthalmic solution Place 1 drop into both eyes 2 (two) times daily.  5 mL  11  . ondansetron (ZOFRAN) 8 MG tablet TAKE 1 TABLET (8 MG TOTAL) BY MOUTH EVERY 8 (EIGHT) HOURS AS NEEDED FOR NAUSEA.  30 tablet  2  . predniSONE (DELTASONE) 50 MG tablet 13 hrs, 7 hrs, and 1 hr before scheduled CT scans       No current facility-administered medications for this visit.    SURGICAL HISTORY:  Past Surgical History  Procedure Laterality Date  . Cervical laminectomy  1995  . Other surgical history  2008    Gamma knife surgery to remove brain met   . 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 Knee surgery  1990    Left x 2  . Portacath placement  -[name]    TIP IN CAVOATRIAL JUNCTION    REVIEW OF SYSTEMS:  A comprehensive review of systems was negative.   PHYSICAL EXAMINATION: General appearance: alert, cooperative and no distress Head: Normocephalic, without obvious abnormality, atraumatic Neck: no adenopathy Lymph nodes: Cervical, supraclavicular, and axillary nodes normal. Resp: clear to auscultation bilaterally Cardio: regular rate and rhythm, S1, S2 normal, no murmur, click, rub or gallop GI: soft, non-tender; bowel sounds normal; no masses,  no organomegaly Extremities: extremities normal, atraumatic, no cyanosis or edema Neurologic: Alert and oriented X 3, normal strength and tone. Normal symmetric reflexes. Normal coordination and gait  ECOG PERFORMANCE STATUS: 0 - Asymptomatic  Blood pressure 117/74, pulse 82, temperature 97.7 F (36.5 C), temperature source Oral, resp. rate 20,  height 5\' 4"  (1.626 m), weight 159 lb 3.2 oz (72.213 kg).  LABORATORY DATA: Lab Results  Component Value Date   WBC 6.7 09/01/2012   HGB 12.7 09/01/2012   HCT 37.6 09/01/2012   MCV 88.3 09/01/2012   PLT 392 09/01/2012      Chemistry      Component Value Date/Time   NA 145 09/01/2012 0854   NA 135 08/20/2011 0953   K 4.6 09/01/2012 0854   K 4.4 08/20/2011 0953   CL 106 06/30/2012 1004   CL 105 08/20/2011 0953   CO2 25 09/01/2012 0854   CO2 28 08/20/2011 0953   BUN 9.5 09/01/2012 0854   BUN 13 08/20/2011 0953   CREATININE 0.7 09/01/2012 0854   CREATININE 0.80 08/20/2011 0953      Component Value Date/Time   CALCIUM 9.9 09/01/2012 0854   CALCIUM 9.4 08/20/2011 0953   ALKPHOS 77 09/01/2012 0854   ALKPHOS 73 08/20/2011 0953   AST 15 09/01/2012 0854   AST 16 08/20/2011 0953  ALT 10 09/01/2012 0854   ALT 9 08/20/2011 0953   BILITOT 0.22 09/01/2012 0854   BILITOT 0.5 08/20/2011 0953       RADIOGRAPHIC STUDIES: Ct Chest W Contrast  08/07/2012   *RADIOLOGY REPORT*  Clinical Data:  Lung cancer with brain metastasis.  Chemotherapy ongoing.  CT CHEST, ABDOMEN AND PELVIS WITH CONTRAST  Technique:  Multidetector CT imaging of the chest, abdomen and pelvis was performed following the standard protocol during bolus administration of intravenous contrast.  Contrast:  100 ml Omnipaque 300  Comparison:  CT 06/05/2012    CT CHEST  Findings:  Port right anterior chest wall.  No axillary supraclavicular lymphadenopathy.  No mediastinal or hilar lymphadenopathy.  No pericardial fluid.  Esophagus is normal.  Review of the lung parenchyma demonstrates nodular pleural focal thickening at the medial right lung base measuring 21 x 18 mm similar to 21 x 18 mm on prior.  The second focus of nodular pleural thickening anteriorly in the right upper lobe measuring 10 x 6 mm also unchanged.  There is linear interstitial thickening in the right lung apex.  These findings likely relate to radiation therapy.  IMPRESSION:  1.  No  evidence of lung cancer recurrence. 2.  Stable nodular pleural parenchymal thickening at the right lung apex.  This likely relates to radiation therapy.    CT ABDOMEN AND PELVIS  Findings:  No focal hepatic lesion.  The gallbladder, pancreas, spleen, adrenal glands, and kidneys are normal.  The stomach, small bowel, appendix, and cecum normal.  The colon and the rectosigmoid colon are normal.  Abdominal aorta normal caliber.  No retroperitoneal periportal lymphadenopathy.  No evidence of peritoneal disease or mesenteric disease.  The bladder and uterus and ovaries are normal.  No pelvic lymphadenopathy. Review of  bone windows demonstrates no aggressive osseous lesions.  Stable vertebral plasty augmentation in the upper mid thoracic spine and severe compression fracture T3.  IMPRESSION:  1.  No evidence of metastasis in the abdomen or pelvis. 2.  Stable augmentation and compression fracture of the thoracic spine.   Original Report Authenticated By: Genevive Bi, M.D.   ASSESSMENT AND PLAN: This is a very pleasant 62 years old white female with metastatic non-small cell lung cancer currently undergoing maintenance chemotherapy with single agent Alimta status post 86 cycles. The patient is doing fine and she has no evidence for disease progression on her recent scan. Patient was discussed with Dr. Arbutus Ped. For her oral candidiasis a prescription for Diflucan was sent her pharmacy of record. She'll proceed with cycle #87 of her maintenance chemotherapy with single agent Alimta. She will followup in 3 weeks prior to cycle #88 with a repeat CBC differential and C. met.  Laural Benes, Alesandro Stueve E, PA-C   She was advised to call immediately if she has any concerning symptoms in the interval.  The patient voices understanding of current disease status and treatment options and is in agreement with the current care plan.  All questions were answered. The patient knows to call the clinic with any problems, questions  or concerns. We can certainly see the patient much sooner if necessary.  I spent 20 minutes counseling the patient face to face. The total time spent in the appointment was 30 minutes.

## 2012-09-22 ENCOUNTER — Other Ambulatory Visit (HOSPITAL_BASED_OUTPATIENT_CLINIC_OR_DEPARTMENT_OTHER): Payer: Medicare Other | Admitting: Lab

## 2012-09-22 ENCOUNTER — Telehealth: Payer: Self-pay | Admitting: Internal Medicine

## 2012-09-22 ENCOUNTER — Telehealth: Payer: Self-pay | Admitting: *Deleted

## 2012-09-22 ENCOUNTER — Encounter: Payer: Self-pay | Admitting: Physician Assistant

## 2012-09-22 ENCOUNTER — Encounter: Payer: Self-pay | Admitting: *Deleted

## 2012-09-22 ENCOUNTER — Ambulatory Visit (HOSPITAL_BASED_OUTPATIENT_CLINIC_OR_DEPARTMENT_OTHER): Payer: Medicare Other | Admitting: Physician Assistant

## 2012-09-22 ENCOUNTER — Ambulatory Visit (HOSPITAL_BASED_OUTPATIENT_CLINIC_OR_DEPARTMENT_OTHER): Payer: Medicare Other

## 2012-09-22 DIAGNOSIS — B370 Candidal stomatitis: Secondary | ICD-10-CM

## 2012-09-22 DIAGNOSIS — C3410 Malignant neoplasm of upper lobe, unspecified bronchus or lung: Secondary | ICD-10-CM

## 2012-09-22 DIAGNOSIS — Z5111 Encounter for antineoplastic chemotherapy: Secondary | ICD-10-CM | POA: Diagnosis not present

## 2012-09-22 DIAGNOSIS — C7931 Secondary malignant neoplasm of brain: Secondary | ICD-10-CM

## 2012-09-22 DIAGNOSIS — C3490 Malignant neoplasm of unspecified part of unspecified bronchus or lung: Secondary | ICD-10-CM

## 2012-09-22 LAB — CBC WITH DIFFERENTIAL/PLATELET
BASO%: 0.5 % (ref 0.0–2.0)
Basophils Absolute: 0 10e3/uL (ref 0.0–0.1)
EOS%: 1.2 % (ref 0.0–7.0)
Eosinophils Absolute: 0.1 10e3/uL (ref 0.0–0.5)
HCT: 36.4 % (ref 34.8–46.6)
HGB: 12.2 g/dL (ref 11.6–15.9)
LYMPH%: 26.6 % (ref 14.0–49.7)
MCH: 29.5 pg (ref 25.1–34.0)
MCHC: 33.5 g/dL (ref 31.5–36.0)
MCV: 88 fL (ref 79.5–101.0)
MONO#: 0.9 10e3/uL (ref 0.1–0.9)
MONO%: 12.7 % (ref 0.0–14.0)
NEUT#: 4.4 10e3/uL (ref 1.5–6.5)
NEUT%: 59 % (ref 38.4–76.8)
Platelets: 388 10e3/uL (ref 145–400)
RBC: 4.13 10e6/uL (ref 3.70–5.45)
RDW: 14.2 % (ref 11.2–14.5)
WBC: 7.4 10e3/uL (ref 3.9–10.3)
lymph#: 2 10e3/uL (ref 0.9–3.3)

## 2012-09-22 LAB — COMPREHENSIVE METABOLIC PANEL (CC13)
ALT: 8 U/L (ref 0–55)
AST: 14 U/L (ref 5–34)
Albumin: 3.3 g/dL — ABNORMAL LOW (ref 3.5–5.0)
Alkaline Phosphatase: 76 U/L (ref 40–150)
BUN: 7.5 mg/dL (ref 7.0–26.0)
CO2: 28 meq/L (ref 22–29)
Calcium: 9.8 mg/dL (ref 8.4–10.4)
Chloride: 107 meq/L (ref 98–109)
Creatinine: 0.8 mg/dL (ref 0.6–1.1)
Glucose: 98 mg/dL (ref 70–140)
Potassium: 4.6 meq/L (ref 3.5–5.1)
Sodium: 142 meq/L (ref 136–145)
Total Bilirubin: 0.32 mg/dL (ref 0.20–1.20)
Total Protein: 7 g/dL (ref 6.4–8.3)

## 2012-09-22 MED ORDER — SODIUM CHLORIDE 0.9 % IV SOLN
Freq: Once | INTRAVENOUS | Status: AC
  Administered 2012-09-22: 11:00:00 via INTRAVENOUS

## 2012-09-22 MED ORDER — DEXAMETHASONE SODIUM PHOSPHATE 10 MG/ML IJ SOLN
10.0000 mg | Freq: Once | INTRAMUSCULAR | Status: AC
  Administered 2012-09-22: 10 mg via INTRAVENOUS

## 2012-09-22 MED ORDER — SODIUM CHLORIDE 0.9 % IJ SOLN
10.0000 mL | INTRAMUSCULAR | Status: DC | PRN
  Administered 2012-09-22: 10 mL
  Filled 2012-09-22: qty 10

## 2012-09-22 MED ORDER — HEPARIN SOD (PORK) LOCK FLUSH 100 UNIT/ML IV SOLN
500.0000 [IU] | Freq: Once | INTRAVENOUS | Status: AC | PRN
  Administered 2012-09-22: 500 [IU]
  Filled 2012-09-22: qty 5

## 2012-09-22 MED ORDER — DEXAMETHASONE SODIUM PHOSPHATE 10 MG/ML IJ SOLN
INTRAMUSCULAR | Status: AC
  Filled 2012-09-22: qty 1

## 2012-09-22 MED ORDER — MORPHINE SULFATE 30 MG PO TABS: 30.0000 mg | ORAL_TABLET | ORAL | Status: DC | PRN

## 2012-09-22 MED ORDER — ONDANSETRON 8 MG/50ML IVPB (CHCC)
8.0000 mg | Freq: Once | INTRAVENOUS | Status: AC
  Administered 2012-09-22: 8 mg via INTRAVENOUS

## 2012-09-22 MED ORDER — ONDANSETRON 8 MG/NS 50 ML IVPB
INTRAVENOUS | Status: AC
  Filled 2012-09-22: qty 8

## 2012-09-22 MED ORDER — MORPHINE SULFATE ER 60 MG PO TBCR: 60.0000 mg | EXTENDED_RELEASE_TABLET | Freq: Two times a day (BID) | ORAL | Status: DC

## 2012-09-22 MED ORDER — SODIUM CHLORIDE 0.9 % IV SOLN
500.0000 mg/m2 | Freq: Once | INTRAVENOUS | Status: AC
  Administered 2012-09-22: 900 mg via INTRAVENOUS
  Filled 2012-09-22: qty 36

## 2012-09-22 MED ORDER — ALPRAZOLAM 0.25 MG PO TABS: 0.2500 mg | ORAL_TABLET | Freq: Every evening | ORAL | Status: DC | PRN

## 2012-09-22 NOTE — Progress Notes (Addendum)
[facility] [phone]   [phone]  SHARED VISIT PROGRESS NOTE  [name], MD [address] [place] [address]  Diagnosis:  Metastatic non-small cell lung cancer initially diagnosed with locally advanced stage IIIB with right Pancoast tumor involving the vertebral body as well as the foraminal canal invasion with spinal cord compression in October of 2007.   Prior Therapy:  1. Status post concurrent chemoradiation with weekly carboplatin and paclitaxel, last dose was given November 18, 2005. 2. 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 3. Status post gamma knife radiotherapy to a solitary brain lesion located in the superior frontal area of the brain at [facility] in April of 2008. 4. Status post palliative radiotherapy to the lateral abdominal wall metastatic lesion under the care of Dr. Mitzi Hansen, completed March of 2009. 5. Status post 6 cycles of systemic chemotherapy with carboplatin and Alimta. Last dose was given July 26, 2007 with disease stabilization. 6. Gamma knife stereotactic radiotherapy to 2 brain lesions one involving the right frontal dural based as well as right parietal lesion performed on 05/07/2012 under the care of Dr. Atlee Abide at [facility].  Current Therapy:  Maintenance chemotherapy with Alimta 500 mg per meter squared given every 3 weeks. The patient is status post 87 cycles.  CHEMOTHERAPY INTENT: Palliative/maintenance  CURRENT # OF CHEMOTHERAPY CYCLES: 88  CURRENT ANTIEMETICS: Zofran, dexamethasone and Compazine  CURRENT SMOKING STATUS: Quit smoking 10/07/2005  ORAL CHEMOTHERAPY AND CONSENT: None  CURRENT BISPHOSPHONATES USE: None  PAIN MANAGEMENT: 0/10 currently on morphine  NARCOTICS INDUCED CONSTIPATION: No constipation  LIVING WILL AND CODE STATUS: No CODE BLUE  INTERVAL  HISTORY: Qiara Minetti Prinsen 62 y.o. female returns to the clinic today for followup visit accompanied by her sister. She completed her course of Diflucan but feels that the depression may not be completely resolved. She is eating better. She requests refills for her MS Contin, MSIR and Xanax. She reports that she is do a followup brain scan as well as follow this up with Dr. Johny Drilling at [facility] on 10/12/2012. She voiced no other specific complaints today.  She is tolerating her treatment with maintenance Alimta fairly well with no significant adverse effects. The patient denied having any nausea or vomiting. She has no chest pain, shortness breath, cough or hemoptysis. She has no fatigue weakness. She presents to proceed with cycle #88 of her maintenance chemotherapy with single agent Alimta.  MEDICAL HISTORY: Past Medical History  Diagnosis Date  . Hypertension   . Anemia   . H/O: pneumonia   . History of tobacco abuse quit 10/08    on nicotine patch  . Fibromyalgia   . Hypokalemia   . DJD (degenerative joint disease), cervical   . Thrush 12/11/2010  . 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 had chemo and radiation for lung ca    ALLERGIES:  is allergic to contrast media; co-trimoxazole injection; iohexol; and sulfa drugs cross reactors.  MEDICATIONS:  Current Outpatient Prescriptions  Medication Sig Dispense Refill  . ALPRAZolam (XANAX) 0.25 MG tablet Take 1 tablet (0.25 mg total) by mouth at bedtime as needed for sleep.  30 tablet  0  . antipyrine-benzocaine (AURALGAN) otic solution Place 1 drop into both ears daily.      . fluconazole (DIFLUCAN) 100 MG tablet Take 2 tablets by mouth  on day one, then take 1 tablet by mouth daily until completed  16 tablet  0  . fluticasone (FLONASE) 50 MCG/ACT nasal spray Place 1-2 sprays into the nose daily.  16 g  11  . folic acid (FOLVITE) 1 MG tablet TAKE 1 TABLET BY MOUTH DAILY.  90 tablet  3  . lidocaine-prilocaine (EMLA)  cream Apply topically as needed.  30 g  1  . lisinopril (PRINIVIL,ZESTRIL) 5 MG tablet TAKE 1 TABLET BY MOUTH EVERY DAY  90 tablet  0  . meclizine (ANTIVERT) 12.5 MG tablet Take 1-2 tablets (12.5-25 mg total) by mouth 3 (three) times daily as needed.  30 tablet  1  . morphine (MS CONTIN) 60 MG 12 hr tablet Take 1 tablet (60 mg total) by mouth 2 (two) times daily.  60 tablet  0  . morphine (MSIR) 30 MG tablet Take 1 tablet (30 mg total) by mouth every 4 (four) hours as needed.  30 tablet  0  . olopatadine (PATANOL) 0.1 % ophthalmic solution Place 1 drop into both eyes 2 (two) times daily.  5 mL  11  . ondansetron (ZOFRAN) 8 MG tablet TAKE 1 TABLET (8 MG TOTAL) BY MOUTH EVERY 8 (EIGHT) HOURS AS NEEDED FOR NAUSEA.  30 tablet  2  . predniSONE (DELTASONE) 50 MG tablet 13 hrs, 7 hrs, and 1 hr before scheduled CT scans       No current facility-administered medications for this visit.    SURGICAL HISTORY:  Past Surgical History  Procedure Laterality Date  . Cervical laminectomy  1995  . Other surgical history  2008    Gamma knife surgery to remove brain met   . 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 Knee surgery  1990    Left x 2  . Portacath placement  -[name]    TIP IN CAVOATRIAL JUNCTION    REVIEW OF SYSTEMS:  A comprehensive review of systems was negative.   PHYSICAL EXAMINATION: General appearance: alert, cooperative and no distress Head: Normocephalic, without obvious abnormality, atraumatic Neck: no adenopathy Lymph nodes: Cervical, supraclavicular, and axillary nodes normal. Resp: clear to auscultation bilaterally Cardio: regular rate and rhythm, S1, S2 normal, no murmur, click, rub or gallop GI: soft, non-tender; bowel sounds normal; no masses,  no organomegaly Extremities: extremities normal, atraumatic, no cyanosis or edema Neurologic: Alert and oriented X 3, normal strength and tone. Normal symmetric reflexes. Normal coordination and  gait Mouth: Reveals mild thrush on the tongue only  ECOG PERFORMANCE STATUS: 0 - Asymptomatic  Blood pressure 117/72, pulse 83, temperature 98.2 F (36.8 C), temperature source Oral, resp. rate 18, height 5\' 4"  (1.626 m), weight 157 lb (71.215 kg).  LABORATORY DATA: Lab Results  Component Value Date   WBC 7.4 09/22/2012   HGB 12.2 09/22/2012   HCT 36.4 09/22/2012   MCV 88.0 09/22/2012   PLT 388 09/22/2012      Chemistry      Component Value Date/Time   NA 142 09/22/2012 0909   NA 135 08/20/2011 0953   K 4.6 09/22/2012 0909   K 4.4 08/20/2011 0953   CL 106 06/30/2012 1004   CL 105 08/20/2011 0953   CO2 28 09/22/2012 0909   CO2 28 08/20/2011 0953   BUN 7.5 09/22/2012 0909   BUN 13 08/20/2011 0953   CREATININE 0.8 09/22/2012 0909   CREATININE 0.80 08/20/2011 0953      Component Value Date/Time  CALCIUM 9.8 09/22/2012 0909   CALCIUM 9.4 08/20/2011 0953   ALKPHOS 76 09/22/2012 0909   ALKPHOS 73 08/20/2011 0953   AST 14 09/22/2012 0909   AST 16 08/20/2011 0953   ALT 8 09/22/2012 0909   ALT 9 08/20/2011 0953   BILITOT 0.32 09/22/2012 0909   BILITOT 0.5 08/20/2011 0953       RADIOGRAPHIC STUDIES: Ct Chest W Contrast  08/07/2012   *RADIOLOGY REPORT*  Clinical Data:  Lung cancer with brain metastasis.  Chemotherapy ongoing.  CT CHEST, ABDOMEN AND PELVIS WITH CONTRAST  Technique:  Multidetector CT imaging of the chest, abdomen and pelvis was performed following the standard protocol during bolus administration of intravenous contrast.  Contrast:  100 ml Omnipaque 300  Comparison:  CT 06/05/2012    CT CHEST  Findings:  Port right anterior chest wall.  No axillary supraclavicular lymphadenopathy.  No mediastinal or hilar lymphadenopathy.  No pericardial fluid.  Esophagus is normal.  Review of the lung parenchyma demonstrates nodular pleural focal thickening at the medial right lung base measuring 21 x 18 mm similar to 21 x 18 mm on prior.  The second focus of nodular pleural thickening anteriorly in the  right upper lobe measuring 10 x 6 mm also unchanged.  There is linear interstitial thickening in the right lung apex.  These findings likely relate to radiation therapy.  IMPRESSION:  1.  No evidence of lung cancer recurrence. 2.  Stable nodular pleural parenchymal thickening at the right lung apex.  This likely relates to radiation therapy.    CT ABDOMEN AND PELVIS  Findings:  No focal hepatic lesion.  The gallbladder, pancreas, spleen, adrenal glands, and kidneys are normal.  The stomach, small bowel, appendix, and cecum normal.  The colon and the rectosigmoid colon are normal.  Abdominal aorta normal caliber.  No retroperitoneal periportal lymphadenopathy.  No evidence of peritoneal disease or mesenteric disease.  The bladder and uterus and ovaries are normal.  No pelvic lymphadenopathy. Review of  bone windows demonstrates no aggressive osseous lesions.  Stable vertebral plasty augmentation in the upper mid thoracic spine and severe compression fracture T3.  IMPRESSION:  1.  No evidence of metastasis in the abdomen or pelvis. 2.  Stable augmentation and compression fracture of the thoracic spine.   Original Report Authenticated By: Genevive Bi, M.D.   ASSESSMENT AND PLAN: This is a very pleasant 62 years old white female with metastatic non-small cell lung cancer currently undergoing maintenance chemotherapy with single agent Alimta status post 86 cycles. The patient is doing fine and she has no evidence for disease progression on her last scan. Patient was discussed with and also seen by Dr. Arbutus Ped. There shows a little mild residual thrush on the tongue. Per Dr. Arbutus Ped The patient try brushing her tongue using a tongue scraper. If this is ineffective and eradicating the remaining oral candidiasis we will try another round of Diflucan. The patient will keep [place] abreast of her symptoms. Her counts reviewed and all are within normal range for her to proceed with cycle #88 of her maintenance chemotherapy  with single agent Alimta at 500 mg per meter squared given every 3 weeks. She will followup with Dr. Arbutus Ped in 3 weeks with repeat CBC differential, C. met and CT of the chest, abdomen and pelvis with contrast to reevaluate her disease. She will premedicate herself with prednisone 50 mg taken 13 hours 7 hours and 1 hour prior to her CT scan along with 50 mg of Benadryl  taken one hour before her CT scan. She has plenty of prednisone to proceed with his premedication regimen.   Laural Benes, Gershom Brobeck E, PA-C   She was advised to call immediately if she has any concerning symptoms in the interval.  The patient voices understanding of current disease status and treatment options and is in agreement with the current care plan.  All questions were answered. The patient knows to call the clinic with any problems, questions or concerns. We can certainly see the patient much sooner if necessary.  ADDENDUM:  Hematology/Oncology Attending:  I have a face to face encounter with the patient during his visit. I recommended her care plan. The patient has metastatic non-small cell lung cancer and currently undergoing maintenance chemotherapy with single agent Alimta status post 87 cycles. She is tolerating her treatment fairly well with no significant adverse effects. We'll proceed with cycle #88 today as scheduled. The patient would come back for follow up visit in 3 weeks with repeat CT scan of the chest, abdomen and pelvis for restaging of her disease. Lajuana Matte., MD 09/27/2012

## 2012-09-22 NOTE — Telephone Encounter (Signed)
Per staff message and POF I have scheduled appts.  JMW  

## 2012-09-22 NOTE — Patient Instructions (Signed)
Followup with Dr. Arbutus Ped in 3 weeks with a restaging CT scan of your chest, abdomen and pelvis contrast reevaluate her disease.  Be sure to take your prednisone and Benadryl prior to your CT scan as before.

## 2012-09-22 NOTE — Progress Notes (Signed)
09/22/12 @ 10:00 am, BMS [id number]:  Danielle [name] into the [facility] for labs, a physical examination and treatment with maintenance pemetrexed.  She completed the LCSS and the EQ-5E-3L questionnaires before any other procedures.  Reviewed them for completeness.  She had research labs drawn along with her treatment labs.

## 2012-09-22 NOTE — Patient Instructions (Addendum)
[facility] Discharge Instructions for Patients Receiving Chemotherapy  Today you received the following chemotherapy agents alimita  To help prevent nausea and vomiting after your treatment, we encourage you to take your nausea medication as prescribed by your physician.   If you develop nausea and vomiting that is not controlled by your nausea medication, call the clinic.   BELOW ARE SYMPTOMS THAT SHOULD BE REPORTED IMMEDIATELY:  *FEVER GREATER THAN 100.5 F  *CHILLS WITH OR WITHOUT FEVER  NAUSEA AND VOMITING THAT IS NOT CONTROLLED WITH YOUR NAUSEA MEDICATION  *UNUSUAL SHORTNESS OF BREATH  *UNUSUAL BRUISING OR BLEEDING  TENDERNESS IN MOUTH AND THROAT WITH OR WITHOUT PRESENCE OF ULCERS  *URINARY PROBLEMS  *BOWEL PROBLEMS  UNUSUAL RASH Items with * indicate a potential emergency and should be followed up as soon as possible.  Feel free to call the clinic you have any questions or concerns. The clinic phone number is [phone].

## 2012-10-08 ENCOUNTER — Other Ambulatory Visit: Payer: Self-pay | Admitting: Medical Oncology

## 2012-10-08 MED ORDER — MORPHINE SULFATE 30 MG PO TABS: 30.0000 mg | ORAL_TABLET | ORAL | Status: DC | PRN

## 2012-10-09 ENCOUNTER — Other Ambulatory Visit: Payer: Medicare Other | Admitting: Lab

## 2012-10-09 ENCOUNTER — Ambulatory Visit (HOSPITAL_COMMUNITY)
Admission: RE | Admit: 2012-10-09 | Discharge: 2012-10-09 | Disposition: A | Discharge status: Home / Self Care | Payer: Medicare Other | Source: Ambulatory Visit | Attending: Physician Assistant | Admitting: Physician Assistant

## 2012-10-09 DIAGNOSIS — C3410 Malignant neoplasm of upper lobe, unspecified bronchus or lung: Secondary | ICD-10-CM | POA: Diagnosis not present

## 2012-10-09 DIAGNOSIS — R091 Pleurisy: Secondary | ICD-10-CM | POA: Insufficient documentation

## 2012-10-09 DIAGNOSIS — C3490 Malignant neoplasm of unspecified part of unspecified bronchus or lung: Secondary | ICD-10-CM | POA: Insufficient documentation

## 2012-10-09 MED ORDER — IOHEXOL 300 MG/ML  SOLN
50.0000 mL | Freq: Once | INTRAMUSCULAR | Status: AC | PRN
  Administered 2012-10-09: 50 mL via ORAL

## 2012-10-09 MED ORDER — IOHEXOL 300 MG/ML  SOLN
100.0000 mL | Freq: Once | INTRAMUSCULAR | Status: AC | PRN
  Administered 2012-10-09: 100 mL via INTRAVENOUS

## 2012-10-12 DIAGNOSIS — C801 Malignant (primary) neoplasm, unspecified: Secondary | ICD-10-CM | POA: Diagnosis not present

## 2012-10-12 DIAGNOSIS — C7931 Secondary malignant neoplasm of brain: Secondary | ICD-10-CM | POA: Diagnosis not present

## 2012-10-13 ENCOUNTER — Ambulatory Visit (HOSPITAL_BASED_OUTPATIENT_CLINIC_OR_DEPARTMENT_OTHER): Payer: Medicare Other

## 2012-10-13 ENCOUNTER — Ambulatory Visit: Payer: Medicare Other | Admitting: Internal Medicine

## 2012-10-13 ENCOUNTER — Telehealth: Payer: Self-pay | Admitting: Internal Medicine

## 2012-10-13 ENCOUNTER — Other Ambulatory Visit: Payer: Self-pay | Admitting: *Deleted

## 2012-10-13 ENCOUNTER — Encounter: Payer: Self-pay | Admitting: Internal Medicine

## 2012-10-13 ENCOUNTER — Encounter: Payer: Self-pay | Admitting: *Deleted

## 2012-10-13 ENCOUNTER — Ambulatory Visit (HOSPITAL_BASED_OUTPATIENT_CLINIC_OR_DEPARTMENT_OTHER): Payer: Medicare Other | Admitting: Internal Medicine

## 2012-10-13 ENCOUNTER — Other Ambulatory Visit (HOSPITAL_BASED_OUTPATIENT_CLINIC_OR_DEPARTMENT_OTHER): Payer: Medicare Other | Admitting: Lab

## 2012-10-13 ENCOUNTER — Telehealth: Payer: Self-pay | Admitting: *Deleted

## 2012-10-13 VITALS — BP 152/77 | HR 75 | Temp 98.4°F | Resp 18 | Ht 64.0 in | Wt 155.2 lb

## 2012-10-13 DIAGNOSIS — Z5111 Encounter for antineoplastic chemotherapy: Secondary | ICD-10-CM | POA: Diagnosis not present

## 2012-10-13 DIAGNOSIS — C7931 Secondary malignant neoplasm of brain: Secondary | ICD-10-CM

## 2012-10-13 DIAGNOSIS — C3410 Malignant neoplasm of upper lobe, unspecified bronchus or lung: Secondary | ICD-10-CM

## 2012-10-13 DIAGNOSIS — C7951 Secondary malignant neoplasm of bone: Secondary | ICD-10-CM

## 2012-10-13 DIAGNOSIS — C3491 Malignant neoplasm of unspecified part of right bronchus or lung: Secondary | ICD-10-CM

## 2012-10-13 DIAGNOSIS — C50919 Malignant neoplasm of unspecified site of unspecified female breast: Secondary | ICD-10-CM

## 2012-10-13 DIAGNOSIS — Z23 Encounter for immunization: Secondary | ICD-10-CM

## 2012-10-13 DIAGNOSIS — C3490 Malignant neoplasm of unspecified part of unspecified bronchus or lung: Secondary | ICD-10-CM

## 2012-10-13 LAB — COMPREHENSIVE METABOLIC PANEL (CC13)
ALT: 8 U/L (ref 0–55)
AST: 13 U/L (ref 5–34)
Albumin: 3.4 g/dL — ABNORMAL LOW (ref 3.5–5.0)
CO2: 26 mEq/L (ref 22–29)
Chloride: 109 mEq/L (ref 98–109)
Potassium: 3.9 mEq/L (ref 3.5–5.1)
Sodium: 143 mEq/L (ref 136–145)
Total Bilirubin: 0.52 mg/dL (ref 0.20–1.20)
Total Protein: 6.9 g/dL (ref 6.4–8.3)

## 2012-10-13 LAB — CBC WITH DIFFERENTIAL/PLATELET
BASO%: 0.6 % (ref 0.0–2.0)
EOS%: 0.4 % (ref 0.0–7.0)
HCT: 37.5 % (ref 34.8–46.6)
HGB: 12.5 g/dL (ref 11.6–15.9)
LYMPH%: 23.4 % (ref 14.0–49.7)
MCHC: 33.4 g/dL (ref 31.5–36.0)
MCV: 87.3 fL (ref 79.5–101.0)
MONO#: 0.8 10*3/uL (ref 0.1–0.9)
MONO%: 11.4 % (ref 0.0–14.0)
NEUT%: 64.2 % (ref 38.4–76.8)
RBC: 4.29 10*6/uL (ref 3.70–5.45)
RDW: 14.7 % — ABNORMAL HIGH (ref 11.2–14.5)
WBC: 7.4 10*3/uL (ref 3.9–10.3)
lymph#: 1.7 10*3/uL (ref 0.9–3.3)

## 2012-10-13 MED ORDER — INFLUENZA VAC SPLIT QUAD 0.5 ML IM SUSP
0.5000 mL | INTRAMUSCULAR | Status: AC
  Administered 2012-10-13: 0.5 mL via INTRAMUSCULAR
  Filled 2012-10-13: qty 0.5

## 2012-10-13 MED ORDER — HEPARIN SOD (PORK) LOCK FLUSH 100 UNIT/ML IV SOLN
500.0000 [IU] | Freq: Once | INTRAVENOUS | Status: AC | PRN
  Administered 2012-10-13: 500 [IU]
  Filled 2012-10-13: qty 5

## 2012-10-13 MED ORDER — ONDANSETRON 8 MG/50ML IVPB (CHCC)
8.0000 mg | Freq: Once | INTRAVENOUS | Status: AC
  Administered 2012-10-13: 8 mg via INTRAVENOUS

## 2012-10-13 MED ORDER — ONDANSETRON 8 MG/NS 50 ML IVPB
INTRAVENOUS | Status: AC
  Filled 2012-10-13: qty 8

## 2012-10-13 MED ORDER — DEXAMETHASONE SODIUM PHOSPHATE 10 MG/ML IJ SOLN
10.0000 mg | Freq: Once | INTRAMUSCULAR | Status: AC
  Administered 2012-10-13: 10 mg via INTRAVENOUS

## 2012-10-13 MED ORDER — SODIUM CHLORIDE 0.9 % IV SOLN
500.0000 mg/m2 | Freq: Once | INTRAVENOUS | Status: AC
  Administered 2012-10-13: 900 mg via INTRAVENOUS
  Filled 2012-10-13: qty 36

## 2012-10-13 MED ORDER — SODIUM CHLORIDE 0.9 % IV SOLN
Freq: Once | INTRAVENOUS | Status: AC
  Administered 2012-10-13: 11:00:00 via INTRAVENOUS

## 2012-10-13 MED ORDER — ONDANSETRON HCL 8 MG PO TABS: ORAL_TABLET | ORAL | Status: DC

## 2012-10-13 MED ORDER — SODIUM CHLORIDE 0.9 % IJ SOLN
10.0000 mL | INTRAMUSCULAR | Status: DC | PRN
  Administered 2012-10-13: 10 mL
  Filled 2012-10-13: qty 10

## 2012-10-13 MED ORDER — LIDOCAINE-PRILOCAINE 2.5-2.5 % EX CREA: TOPICAL_CREAM | CUTANEOUS | Status: DC | PRN

## 2012-10-13 MED ORDER — DEXAMETHASONE SODIUM PHOSPHATE 10 MG/ML IJ SOLN
INTRAMUSCULAR | Status: AC
  Filled 2012-10-13: qty 1

## 2012-10-13 NOTE — Progress Notes (Signed)
10/13/12 @ 08:45, BMS [id number], Completion of LCSS and EQ-5D-3L PROs:  Danielle [name] into the [facility] to see Dr. Arbutus Ped and have treatment with Alimta.  She has had restaging scans and a brain MRI at [facility].  She has no evidence of disease progression and will proceed with maintenance Alimta.  She completed both questionnaires prior to any other activities.  Reviewed both the completeness.

## 2012-10-13 NOTE — Progress Notes (Signed)
[facility] [phone]   [phone]  OFFICE PROGRESS NOTE  [name], MD [address] [place] [address]  Diagnosis:  Metastatic non-small cell lung cancer initially diagnosed with locally advanced stage IIIB with right Pancoast tumor involving the vertebral body as well as the foraminal canal invasion with spinal cord compression in October of 2007.   Prior Therapy:  1. Status post concurrent chemoradiation with weekly carboplatin and paclitaxel, last dose was given November 18, 2005. 2. 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 3. Status post gamma knife radiotherapy to a solitary brain lesion located in the superior frontal area of the brain at [facility] in April of 2008. 4. Status post palliative radiotherapy to the lateral abdominal wall metastatic lesion under the care of Dr. Mitzi Hansen, completed March of 2009. 5. Status post 6 cycles of systemic chemotherapy with carboplatin and Alimta. Last dose was given July 26, 2007 with disease stabilization. 6. Gamma knife stereotactic radiotherapy to 2 brain lesions one involving the right frontal dural based as well as right parietal lesion performed on 05/07/2012 under the care of Dr. Atlee Abide at [facility].  Current Therapy:  Maintenance chemotherapy with Alimta 500 mg per meter squared given every 3 weeks. The patient is status post 88cycles.   CHEMOTHERAPY INTENT: Palliative/maintenance  CURRENT # OF CHEMOTHERAPY CYCLES: 89  CURRENT ANTIEMETICS: Zofran, dexamethasone and Compazine  CURRENT SMOKING STATUS: Quit smoking 10/07/2005  ORAL CHEMOTHERAPY AND CONSENT: None  CURRENT BISPHOSPHONATES USE: None  PAIN MANAGEMENT: 0/10 currently on morphine  NARCOTICS INDUCED CONSTIPATION: No constipation  LIVING WILL AND CODE STATUS: No CODE BLUE   INTERVAL  HISTORY: Danielle [name] 62 y.o. female returns to the clinic today for followup visit accompanied her sister. The patient is feeling fine today with no specific complaints. She is tolerating her maintenance treatment with single agent Alimta fairly well with no significant adverse effects except for mild nausea 4 days after her treatment. The patient denied having any chest pain, shortness breath, cough or hemoptysis. She intentionally lost around 4 pounds since her last visit. The patient was seen recently by Dr. Johny Drilling at [facility] for evaluation of her brain lesions and MRI of the brain showed no active metastatic disease to the brain. The patient had repeat CT scan of the chest, abdomen and pelvis performed recently and she is here for evaluation and discussion of her scan results.  MEDICAL HISTORY: Past Medical History  Diagnosis Date  . Hypertension   . Anemia   . H/O: pneumonia   . History of tobacco abuse quit 10/08    on nicotine patch  . Fibromyalgia   . Hypokalemia   . DJD (degenerative joint disease), cervical   . Thrush 12/11/2010  . 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 had chemo and radiation for lung ca    ALLERGIES:  is allergic to contrast media; co-trimoxazole injection; iohexol; and sulfa drugs cross reactors.  MEDICATIONS:  Current Outpatient Prescriptions  Medication Sig Dispense Refill  . ALPRAZolam (XANAX) 0.25 MG tablet Take 1 tablet (0.25 mg total) by mouth at bedtime as needed for sleep.  30 tablet  0  . antipyrine-benzocaine (AURALGAN) otic solution Place 1 drop into both ears daily.      . fluconazole (DIFLUCAN) 100 MG tablet Take 2 tablets by mouth  on day one, then take 1 tablet by mouth daily until completed  16 tablet  0  . fluticasone (FLONASE) 50 MCG/ACT nasal spray Place 1-2 sprays into the nose daily.  16 g  11  . folic acid (FOLVITE) 1 MG tablet TAKE 1 TABLET BY MOUTH DAILY.  90 tablet  3  .  lidocaine-prilocaine (EMLA) cream Apply topically as needed.  30 g  1  . lisinopril (PRINIVIL,ZESTRIL) 5 MG tablet TAKE 1 TABLET BY MOUTH EVERY DAY  90 tablet  0  . meclizine (ANTIVERT) 12.5 MG tablet Take 1-2 tablets (12.5-25 mg total) by mouth 3 (three) times daily as needed.  30 tablet  1  . morphine (MS CONTIN) 60 MG 12 hr tablet Take 1 tablet (60 mg total) by mouth 2 (two) times daily.  60 tablet  0  . morphine (MSIR) 30 MG tablet Take 1 tablet (30 mg total) by mouth every 4 (four) hours as needed.  30 tablet  0  . olopatadine (PATANOL) 0.1 % ophthalmic solution Place 1 drop into both eyes 2 (two) times daily.  5 mL  11  . ondansetron (ZOFRAN) 8 MG tablet TAKE 1 TABLET (8 MG TOTAL) BY MOUTH EVERY 8 (EIGHT) HOURS AS NEEDED FOR NAUSEA.  30 tablet  2  . predniSONE (DELTASONE) 50 MG tablet 13 hrs, 7 hrs, and 1 hr before scheduled CT scans       No current facility-administered medications for this visit.    SURGICAL HISTORY:  Past Surgical History  Procedure Laterality Date  . Cervical laminectomy  1995  . Other surgical history  2008    Gamma knife surgery to remove brain met   . 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 Knee surgery  1990    Left x 2  . Portacath placement  -[name]    TIP IN CAVOATRIAL JUNCTION    REVIEW OF SYSTEMS:  Constitutional: negative Eyes: negative Ears, nose, mouth, throat, and face: negative Respiratory: negative Cardiovascular: negative Gastrointestinal: negative Genitourinary:negative Integument/breast: negative Hematologic/lymphatic: negative Musculoskeletal:negative Neurological: negative Behavioral/Psych: negative Endocrine: negative Allergic/Immunologic: negative   PHYSICAL EXAMINATION: General appearance: alert, cooperative and no distress Head: Normocephalic, without obvious abnormality, atraumatic Neck: no adenopathy, no JVD, supple, symmetrical, trachea midline and thyroid not enlarged, symmetric, no  tenderness/mass/nodules Lymph nodes: Cervical, supraclavicular, and axillary nodes normal. Resp: clear to auscultation bilaterally Back: symmetric, no curvature. ROM normal. No CVA tenderness. Cardio: regular rate and rhythm, S1, S2 normal, no murmur, click, rub or gallop GI: soft, non-tender; bowel sounds normal; no masses,  no organomegaly Extremities: extremities normal, atraumatic, no cyanosis or edema Neurologic: Alert and oriented X 3, normal strength and tone. Normal symmetric reflexes. Normal coordination and gait  ECOG PERFORMANCE STATUS: 0 - Asymptomatic  Blood pressure 152/77, pulse 75, temperature 98.4 F (36.9 C), temperature source Oral, resp. rate 18, height 5\' 4"  (1.626 m), weight 155 lb 3.2 oz (70.398 kg), SpO2 99.00%.  LABORATORY DATA: Lab Results  Component Value Date   WBC 7.4 10/13/2012   HGB 12.5 10/13/2012   HCT 37.5 10/13/2012   MCV 87.3 10/13/2012   PLT 307 10/13/2012      Chemistry      Component Value Date/Time   NA 142 09/22/2012 0909   NA 135 08/20/2011 0953   K 4.6 09/22/2012 0909   K 4.4 08/20/2011 0953   CL 106 06/30/2012 1004   CL 105 08/20/2011 0953   CO2 28  09/22/2012 0909   CO2 28 08/20/2011 0953   BUN 7.5 09/22/2012 0909   BUN 13 08/20/2011 0953   CREATININE 0.8 09/22/2012 0909   CREATININE 0.80 08/20/2011 0953      Component Value Date/Time   CALCIUM 9.8 09/22/2012 0909   CALCIUM 9.4 08/20/2011 0953   ALKPHOS 76 09/22/2012 0909   ALKPHOS 73 08/20/2011 0953   AST 14 09/22/2012 0909   AST 16 08/20/2011 0953   ALT 8 09/22/2012 0909   ALT 9 08/20/2011 0953   BILITOT 0.32 09/22/2012 0909   BILITOT 0.5 08/20/2011 0953       RADIOGRAPHIC STUDIES: Ct Chest W Contrast  10/09/2012   EXAM: CT CHEST, ABDOMEN, AND PELVIS WITH CONTRAST  TECHNIQUE: Multidetector CT imaging of the chest, abdomen and pelvis was performed following the standard protocol during bolus administration of intravenous contrast.  CONTRAST:  OMNIPAQUE IOHEXOL 300 MG/ML  SOLN   COMPARISON:  08/07/12  FINDINGS:   CT CHEST FINDINGS  There is no pleural effusion identified. Moderate changes of centrilobular emphysema identified. Subpleural soft tissue attenuating density in the right upper lobe measures 2.9 x 1.1 cm and appears stable from previous exam, image 8/series 2. Radiation change within the paramediastinal right upper lobe appears similar to the previous exam.  No new or enlarging pulmonary nodules or mass is identified. The trachea appears patent and is midline. The heart size appears within normal limits. Calcifications within the LAD coronary artery noted. No mediastinal or hilar adenopathy identified. There is no axillary or supraclavicular adenopathy.  The bones appear osteopenic. There are no aggressive lytic or sclerotic bone lesions identified. Treated compression deformities at T4 and T7 are again noted. The T3 vertebra plana deformity is similar to previous exam. No aggressive lytic or sclerotic bone lesions identified.    CT ABDOMEN AND PELVIS FINDINGS  There is no suspicious liver abnormality identified. The gallbladder appears normal. No biliary dilatation. Normal appearance of the pancreas. The spleen is on unremarkable. The adrenal glands are both normal.  Right kidney appears within normal limits. The left kidney is also stress set there is a small cyst identified within the inferior pole of the left kidney. The urinary bladder appears normal. The uterus and adnexal structures are negative.  The stomach and the small bowel loops have a normal course and caliber. The appendix is visualized and appears normal. Normal appearance of the proximal colon. Mild wall thickening involving the sigmoid colon is identified. This is nonspecific and may reflect incomplete distention.  No free fluid or abnormal fluid collections noted within the abdomen or pelvis. The abdominal aorta has a normal caliber. No upper abdominal adenopathy. There is no pelvic or inguinal adenopathy  noted.  Review of the visualized osseous structures is negative for aggressive lytic or sclerotic bone lesion.    IMPRESSION: CT CHEST IMPRESSION  1. No acute findings identified within the chest. There are no specific features identified to suggest residual or recurrence of tumor.  2. Stable nodular pleural thickening within the right lung apex.  CT ABDOMEN AND PELVIS IMPRESSION  1. No acute findings and no evidence for metastatic disease to the abdomen or pelvis.  CLINICAL DATA: Followup lung cancer   Electronically Signed   By: Signa Kell M.D.   On: 10/09/2012 11:38   ASSESSMENT AND PLAN: This is a very pleasant 62 years old white female with metastatic non-small cell lung cancer currently undergoing maintenance chemotherapy with single agent Alimta status post 88 cycles. The patient  is tolerating her treatment fairly well with no significant adverse effects. The recent CT scan of the chest, abdomen and pelvis showed no evidence for disease progression in the chest, abdomen or pelvis. I discussed the scan results with the patient and her sister. I recommended for her to continue on maintenance and Alimta as scheduled. The patient will receive cycle #89 today. She would come back for followup visit in 3 weeks with the next cycle of her chemotherapy. She was advised to call immediately if she has any concerning symptoms in the interval. The patient voices understanding of current disease status and treatment options and is in agreement with the current care plan.  All questions were answered. The patient knows to call the clinic with any problems, questions or concerns. We can certainly see the patient much sooner if necessary.  I spent 15 minutes counseling the patient face to face. The total time spent in the appointment was 25 minutes.

## 2012-10-13 NOTE — Patient Instructions (Signed)
Current Therapy:  Maintenance chemotherapy with Alimta 500 mg per meter squared given every 3 weeks. The patient is status post 88cycles.  CHEMOTHERAPY INTENT: Palliative/maintenance  CURRENT # OF CHEMOTHERAPY CYCLES: 89  CURRENT ANTIEMETICS: Zofran, dexamethasone and Compazine  CURRENT SMOKING STATUS: Quit smoking 10/07/2005  ORAL CHEMOTHERAPY AND CONSENT: None  CURRENT BISPHOSPHONATES USE: None  PAIN MANAGEMENT: 0/10 currently on morphine  NARCOTICS INDUCED CONSTIPATION: No constipation  LIVING WILL AND CODE STATUS: No CODE BLUE

## 2012-10-13 NOTE — Patient Instructions (Addendum)
[facility] Discharge Instructions for Patients Receiving Chemotherapy  Today you received the following chemotherapy agents Alimta  To help prevent nausea and vomiting after your treatment, we encourage you to take your nausea medication as prescribed.   If you develop nausea and vomiting that is not controlled by your nausea medication, call the clinic.   BELOW ARE SYMPTOMS THAT SHOULD BE REPORTED IMMEDIATELY:  *FEVER GREATER THAN 100.5 F  *CHILLS WITH OR WITHOUT FEVER  NAUSEA AND VOMITING THAT IS NOT CONTROLLED WITH YOUR NAUSEA MEDICATION  *UNUSUAL SHORTNESS OF BREATH  *UNUSUAL BRUISING OR BLEEDING  TENDERNESS IN MOUTH AND THROAT WITH OR WITHOUT PRESENCE OF ULCERS  *URINARY PROBLEMS  *BOWEL PROBLEMS  UNUSUAL RASH Items with * indicate a potential emergency and should be followed up as soon as possible.  Feel free to call the clinic you have any questions or concerns. The clinic phone number is [phone].    

## 2012-10-13 NOTE — Telephone Encounter (Signed)
Per staff message and POF I have scheduled appts.  JMW  

## 2012-10-13 NOTE — Telephone Encounter (Signed)
gv andprinted appt sched and avs for pt for OCT emailed MW to add tx...  °

## 2012-10-22 ENCOUNTER — Other Ambulatory Visit: Payer: Self-pay | Admitting: *Deleted

## 2012-10-22 MED ORDER — ALPRAZOLAM 0.25 MG PO TABS: 0.2500 mg | ORAL_TABLET | Freq: Every evening | ORAL | Status: DC | PRN

## 2012-10-22 MED ORDER — MORPHINE SULFATE 30 MG PO TABS: 30.0000 mg | ORAL_TABLET | ORAL | Status: DC | PRN

## 2012-10-22 MED ORDER — MORPHINE SULFATE ER 60 MG PO TBCR: 60.0000 mg | EXTENDED_RELEASE_TABLET | Freq: Two times a day (BID) | ORAL | Status: DC

## 2012-11-03 ENCOUNTER — Telehealth: Payer: Self-pay | Admitting: Internal Medicine

## 2012-11-03 ENCOUNTER — Other Ambulatory Visit (HOSPITAL_BASED_OUTPATIENT_CLINIC_OR_DEPARTMENT_OTHER): Payer: Medicare Other | Admitting: Lab

## 2012-11-03 ENCOUNTER — Ambulatory Visit (HOSPITAL_BASED_OUTPATIENT_CLINIC_OR_DEPARTMENT_OTHER): Payer: Medicare Other

## 2012-11-03 ENCOUNTER — Encounter: Payer: Self-pay | Admitting: Physician Assistant

## 2012-11-03 ENCOUNTER — Encounter: Payer: Self-pay | Admitting: Oncology

## 2012-11-03 ENCOUNTER — Ambulatory Visit (HOSPITAL_BASED_OUTPATIENT_CLINIC_OR_DEPARTMENT_OTHER): Payer: Medicare Other | Admitting: Physician Assistant

## 2012-11-03 DIAGNOSIS — C7951 Secondary malignant neoplasm of bone: Secondary | ICD-10-CM | POA: Diagnosis not present

## 2012-11-03 DIAGNOSIS — C7931 Secondary malignant neoplasm of brain: Secondary | ICD-10-CM

## 2012-11-03 DIAGNOSIS — C3410 Malignant neoplasm of upper lobe, unspecified bronchus or lung: Secondary | ICD-10-CM

## 2012-11-03 DIAGNOSIS — Z5111 Encounter for antineoplastic chemotherapy: Secondary | ICD-10-CM | POA: Diagnosis not present

## 2012-11-03 DIAGNOSIS — C3491 Malignant neoplasm of unspecified part of right bronchus or lung: Secondary | ICD-10-CM

## 2012-11-03 LAB — CBC WITH DIFFERENTIAL/PLATELET
Eosinophils Absolute: 0 10*3/uL (ref 0.0–0.5)
HCT: 38.9 % (ref 34.8–46.6)
HGB: 12.9 g/dL (ref 11.6–15.9)
LYMPH%: 22.1 % (ref 14.0–49.7)
MONO#: 0.9 10*3/uL (ref 0.1–0.9)
NEUT#: 5.7 10*3/uL (ref 1.5–6.5)
NEUT%: 66.4 % (ref 38.4–76.8)
Platelets: 395 10*3/uL (ref 145–400)
RBC: 4.44 10*6/uL (ref 3.70–5.45)
WBC: 8.5 10*3/uL (ref 3.9–10.3)

## 2012-11-03 LAB — COMPREHENSIVE METABOLIC PANEL (CC13)
Albumin: 3.4 g/dL — ABNORMAL LOW (ref 3.5–5.0)
Anion Gap: 10 mEq/L (ref 3–11)
CO2: 26 mEq/L (ref 22–29)
Creatinine: 0.8 mg/dL (ref 0.6–1.1)
Glucose: 104 mg/dl (ref 70–140)
Potassium: 3.9 mEq/L (ref 3.5–5.1)
Sodium: 143 mEq/L (ref 136–145)
Total Bilirubin: 0.31 mg/dL (ref 0.20–1.20)
Total Protein: 7.3 g/dL (ref 6.4–8.3)

## 2012-11-03 MED ORDER — PEMETREXED DISODIUM CHEMO INJECTION 500 MG
500.0000 mg/m2 | Freq: Once | INTRAVENOUS | Status: AC
  Administered 2012-11-03: 900 mg via INTRAVENOUS
  Filled 2012-11-03: qty 36

## 2012-11-03 MED ORDER — SODIUM CHLORIDE 0.9 % IJ SOLN
10.0000 mL | INTRAMUSCULAR | Status: DC | PRN
  Administered 2012-11-03: 10 mL
  Filled 2012-11-03: qty 10

## 2012-11-03 MED ORDER — DEXAMETHASONE SODIUM PHOSPHATE 10 MG/ML IJ SOLN
INTRAMUSCULAR | Status: AC
  Filled 2012-11-03: qty 1

## 2012-11-03 MED ORDER — SODIUM CHLORIDE 0.9 % IV SOLN
Freq: Once | INTRAVENOUS | Status: AC
  Administered 2012-11-03: 11:00:00 via INTRAVENOUS

## 2012-11-03 MED ORDER — DEXAMETHASONE SODIUM PHOSPHATE 10 MG/ML IJ SOLN
10.0000 mg | Freq: Once | INTRAMUSCULAR | Status: AC
  Administered 2012-11-03: 10 mg via INTRAVENOUS

## 2012-11-03 MED ORDER — CYANOCOBALAMIN 1000 MCG/ML IJ SOLN
INTRAMUSCULAR | Status: AC
  Filled 2012-11-03: qty 1

## 2012-11-03 MED ORDER — ONDANSETRON 8 MG/NS 50 ML IVPB
INTRAVENOUS | Status: AC
  Filled 2012-11-03: qty 8

## 2012-11-03 MED ORDER — HEPARIN SOD (PORK) LOCK FLUSH 100 UNIT/ML IV SOLN
500.0000 [IU] | Freq: Once | INTRAVENOUS | Status: AC | PRN
  Administered 2012-11-03: 500 [IU]
  Filled 2012-11-03: qty 5

## 2012-11-03 MED ORDER — CYANOCOBALAMIN 1000 MCG/ML IJ SOLN
1000.0000 ug | Freq: Once | INTRAMUSCULAR | Status: AC
  Administered 2012-11-03: 1000 ug via INTRAMUSCULAR

## 2012-11-03 MED ORDER — ONDANSETRON 8 MG/50ML IVPB (CHCC)
8.0000 mg | Freq: Once | INTRAVENOUS | Status: AC
  Administered 2012-11-03: 8 mg via INTRAVENOUS

## 2012-11-03 NOTE — Patient Instructions (Signed)
To notify us immediately if he develops fever or discolored nasal or respiratory secretions Followup in 3 weeks prior to your next scheduled cycle of maintenance chemotherapy

## 2012-11-03 NOTE — Progress Notes (Addendum)
[facility] Telephone:([phone]) [phone]   Fax:(336) [phone]  SHARED VISIT PROGRESS NOTE  [name], MD [address] [place] [address]  Diagnosis:  Metastatic non-small cell lung cancer initially diagnosed with locally advanced stage IIIB with right Pancoast tumor involving the vertebral body as well as the foraminal canal invasion with spinal cord compression in October of 2007.   Prior Therapy:  1. Status post concurrent chemoradiation with weekly carboplatin and paclitaxel, last dose was given November 18, 2005. 2. 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 3. Status post gamma knife radiotherapy to a solitary brain lesion located in the superior frontal area of the brain at [facility] in April of 2008. 4. Status post palliative radiotherapy to the lateral abdominal wall metastatic lesion under the care of Dr. Mitzi Hansen, completed March of 2009. 5. Status post 6 cycles of systemic chemotherapy with carboplatin and Alimta. Last dose was given July 26, 2007 with disease stabilization. 6. Gamma knife stereotactic radiotherapy to 2 brain lesions one involving the right frontal dural based as well as right parietal lesion performed on 05/07/2012 under the care of Dr. Atlee Abide at [facility].  Current Therapy:  Maintenance chemotherapy with Alimta 500 mg per meter squared given every 3 weeks. The patient is status post 89 cycles.   CHEMOTHERAPY INTENT: Palliative/maintenance  CURRENT # OF CHEMOTHERAPY CYCLES: 90  CURRENT ANTIEMETICS: Zofran, dexamethasone and Compazine  CURRENT SMOKING STATUS: Quit smoking 10/07/2005  ORAL CHEMOTHERAPY AND CONSENT: None  CURRENT BISPHOSPHONATES USE: None  PAIN MANAGEMENT: 0/10 currently on morphine  NARCOTICS INDUCED CONSTIPATION: No constipation  LIVING WILL AND CODE STATUS: No CODE BLUE   INTERVAL  HISTORY: Danielle [name] 62 y.o. female returns to the clinic today for followup visit accompanied her sister. The patient is feeling fine today with no specific complaints, except for a head congestion and a cough productive of clear secretions. She denies any fever or chills associated with the symptoms. She's been taking Mucinex with improvement in her symptoms.. She is tolerating her maintenance treatment with single agent Alimta fairly well with no significant adverse effects except for mild nausea 4 days after her treatment. The patient denied having any chest pain, shortness breath, or hemoptysis. She presents to proceed with cycle #90 of her maintenance chemotherapy with single agent Alimta. She does not require any prescription refills today.  MEDICAL HISTORY: Past Medical History  Diagnosis Date  . Hypertension   . Anemia   . H/O: pneumonia   . History of tobacco abuse quit 10/08    on nicotine patch  . Fibromyalgia   . Hypokalemia   . DJD (degenerative joint disease), cervical   . Thrush 12/11/2010  . 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 had chemo and radiation for lung ca    ALLERGIES:  is allergic to contrast media; co-trimoxazole injection; iohexol; and sulfa drugs cross reactors.  MEDICATIONS:  Current Outpatient Prescriptions  Medication Sig Dispense Refill  . ALPRAZolam (XANAX) 0.25 MG tablet Take 1 tablet (0.25 mg total) by mouth at bedtime as needed for sleep.  30 tablet  0  . antipyrine-benzocaine (AURALGAN) otic solution Place 1 drop into both ears daily.      . fluconazole (DIFLUCAN) 100 MG tablet Take 2 tablets by mouth on day one, then take 1 tablet by mouth daily until completed  16 tablet  0  . fluticasone (FLONASE) 50 MCG/ACT nasal spray Place 1-2 sprays into the nose daily.  16 g  11  . folic acid (FOLVITE) 1 MG tablet TAKE 1 TABLET BY MOUTH DAILY.  90 tablet  3  . lidocaine-prilocaine (EMLA) cream Apply topically as needed.   30 g  1  . lisinopril (PRINIVIL,ZESTRIL) 5 MG tablet TAKE 1 TABLET BY MOUTH EVERY DAY  90 tablet  0  . meclizine (ANTIVERT) 12.5 MG tablet Take 1-2 tablets (12.5-25 mg total) by mouth 3 (three) times daily as needed.  30 tablet  1  . morphine (MS CONTIN) 60 MG 12 hr tablet Take 1 tablet (60 mg total) by mouth 2 (two) times daily.  60 tablet  0  . morphine (MSIR) 30 MG tablet Take 1 tablet (30 mg total) by mouth every 4 (four) hours as needed.  60 tablet  0  . olopatadine (PATANOL) 0.1 % ophthalmic solution Place 1 drop into both eyes 2 (two) times daily.  5 mL  11  . ondansetron (ZOFRAN) 8 MG tablet TAKE 1 TABLET (8 MG TOTAL) BY MOUTH EVERY 8 (EIGHT) HOURS AS NEEDED FOR NAUSEA.  30 tablet  2  . predniSONE (DELTASONE) 50 MG tablet 13 hrs, 7 hrs, and 1 hr before scheduled CT scans       No current facility-administered medications for this visit.    SURGICAL HISTORY:  Past Surgical History  Procedure Laterality Date  . Cervical laminectomy  1995  . Other surgical history  2008    Gamma knife surgery to remove brain met   . 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 Knee surgery  1990    Left x 2  . Portacath placement  -[name]    TIP IN CAVOATRIAL JUNCTION    REVIEW OF SYSTEMS:  Constitutional: negative Eyes: negative Ears, nose, mouth, throat, and face: positive for nasal congestion and head congestion Respiratory: positive for cough Cardiovascular: negative Gastrointestinal: negative Genitourinary:negative Integument/breast: negative Hematologic/lymphatic: negative Musculoskeletal:negative Neurological: negative Behavioral/Psych: negative Endocrine: negative Allergic/Immunologic: negative   PHYSICAL EXAMINATION: General appearance: alert, cooperative and no distress Head: Normocephalic, without obvious abnormality, atraumatic Neck: no adenopathy, no JVD, supple, symmetrical, trachea midline and thyroid not enlarged, symmetric, no  tenderness/mass/nodules Lymph nodes: Cervical, supraclavicular, and axillary nodes normal. Resp: clear to auscultation bilaterally Back: symmetric, no curvature. ROM normal. No CVA tenderness. Cardio: regular rate and rhythm, S1, S2 normal, no murmur, click, rub or gallop GI: soft, non-tender; bowel sounds normal; no masses,  no organomegaly Extremities: extremities normal, atraumatic, no cyanosis or edema Neurologic: Alert and oriented X 3, normal strength and tone. Normal symmetric reflexes. Normal coordination and gait  ECOG PERFORMANCE STATUS: 0 - Asymptomatic  Blood pressure 151/75, pulse 80, temperature 98 F (36.7 C), temperature source Oral, resp. rate 19, height 5\' 4"  (1.626 m), weight 153 lb (69.4 kg).  LABORATORY DATA: Lab Results  Component Value Date   WBC 8.5 11/03/2012   HGB 12.9 11/03/2012   HCT 38.9 11/03/2012   MCV 87.6 11/03/2012   PLT 395 11/03/2012      Chemistry      Component Value Date/Time   NA 143 10/13/2012 0902   NA 135 08/20/2011 0953   K 3.9 10/13/2012 0902   K 4.4 08/20/2011 0953   CL 106 06/30/2012 1004   CL 105 08/20/2011 0953   CO2 26 10/13/2012 0902   CO2 28 08/20/2011 0953  BUN 8.5 10/13/2012 0902   BUN 13 08/20/2011 0953   CREATININE 0.7 10/13/2012 0902   CREATININE 0.80 08/20/2011 0953      Component Value Date/Time   CALCIUM 9.4 10/13/2012 0902   CALCIUM 9.4 08/20/2011 0953   ALKPHOS 77 10/13/2012 0902   ALKPHOS 73 08/20/2011 0953   AST 13 10/13/2012 0902   AST 16 08/20/2011 0953   ALT 8 10/13/2012 0902   ALT 9 08/20/2011 0953   BILITOT 0.52 10/13/2012 0902   BILITOT 0.5 08/20/2011 0953       RADIOGRAPHIC STUDIES: Ct Chest W Contrast  10/09/2012   EXAM: CT CHEST, ABDOMEN, AND PELVIS WITH CONTRAST  TECHNIQUE: Multidetector CT imaging of the chest, abdomen and pelvis was performed following the standard protocol during bolus administration of intravenous contrast.  CONTRAST:  OMNIPAQUE IOHEXOL 300 MG/ML  SOLN  COMPARISON:  08/07/12   FINDINGS:   CT CHEST FINDINGS  There is no pleural effusion identified. Moderate changes of centrilobular emphysema identified. Subpleural soft tissue attenuating density in the right upper lobe measures 2.9 x 1.1 cm and appears stable from previous exam, image 8/series 2. Radiation change within the paramediastinal right upper lobe appears similar to the previous exam.  No new or enlarging pulmonary nodules or mass is identified. The trachea appears patent and is midline. The heart size appears within normal limits. Calcifications within the LAD coronary artery noted. No mediastinal or hilar adenopathy identified. There is no axillary or supraclavicular adenopathy.  The bones appear osteopenic. There are no aggressive lytic or sclerotic bone lesions identified. Treated compression deformities at T4 and T7 are again noted. The T3 vertebra plana deformity is similar to previous exam. No aggressive lytic or sclerotic bone lesions identified.    CT ABDOMEN AND PELVIS FINDINGS  There is no suspicious liver abnormality identified. The gallbladder appears normal. No biliary dilatation. Normal appearance of the pancreas. The spleen is on unremarkable. The adrenal glands are both normal.  Right kidney appears within normal limits. The left kidney is also stress set there is a small cyst identified within the inferior pole of the left kidney. The urinary bladder appears normal. The uterus and adnexal structures are negative.  The stomach and the small bowel loops have a normal course and caliber. The appendix is visualized and appears normal. Normal appearance of the proximal colon. Mild wall thickening involving the sigmoid colon is identified. This is nonspecific and may reflect incomplete distention.  No free fluid or abnormal fluid collections noted within the abdomen or pelvis. The abdominal aorta has a normal caliber. No upper abdominal adenopathy. There is no pelvic or inguinal adenopathy noted.  Review of the  visualized osseous structures is negative for aggressive lytic or sclerotic bone lesion.    IMPRESSION: CT CHEST IMPRESSION  1. No acute findings identified within the chest. There are no specific features identified to suggest residual or recurrence of tumor.  2. Stable nodular pleural thickening within the right lung apex.  CT ABDOMEN AND PELVIS IMPRESSION  1. No acute findings and no evidence for metastatic disease to the abdomen or pelvis.  CLINICAL DATA: Followup lung cancer   Electronically Signed   By: Signa Kell M.D.   On: 10/09/2012 11:38   ASSESSMENT AND PLAN: This is a very pleasant 62 years old white female with metastatic non-small cell lung cancer currently undergoing maintenance chemotherapy with single agent Alimta status post 89 cycles. The patient is tolerating her treatment fairly well with no significant adverse  effects. The recent CT scan of the chest, abdomen and pelvis showed no evidence for disease progression in the chest, abdomen or pelvis. Patient was discussed with and also seen by Dr. Arbutus Ped. She may continue her symptomatic measures for her head congestion. Should she develop fever or discolored drainage she is to notify us immediately. She will proceed with cycle #90 of her maintenance chemotherapy with single agent Alimta at 500 mg per meter squared given every 3 weeks. She will return in 3 weeks prior to cycle #91  Danielle [name] E, PA-C   She was advised to call immediately if she has any concerning symptoms in the interval. The patient voices understanding of current disease status and treatment options and is in agreement with the current care plan.  All questions were answered. The patient knows to call the clinic with any problems, questions or concerns. We can certainly see the patient much sooner if necessary.  ADDENDUM: Hematology/Oncology Attending:  I had a face to face encounter with the patient. I recommended her care plan.this is a very pleasant 62  years old white female with metastatic non-small cell lung cancer, adenocarcinoma currently undergoing maintenance chemotherapy with single agent Alimta status post 89 cycles. She is tolerating her treatment fairly well with no significant evidence for disease progression. I recommended for the patient to proceed with cycle #90 today as scheduled. She would come back for follow up visit in 3 weeks with the next cycle of her chemotherapy. She was advised to call immediately if she has any concerning symptoms in the interval. Lajuana Matte., MD 11/08/2012

## 2012-11-03 NOTE — Progress Notes (Signed)
11/03/12 - BMS [id number] - Questionnaires - The patient into [facility] this am for her visit with the MD.  The patient was given he questionnaires upon arrival to the cancer center.  The patient completed her PRO's before any study procedures were preformed.  I checked the PRO's for completeness.  I thanked the patient for her continued support of this clinical trial. She then had her research labs drawn.

## 2012-11-03 NOTE — Patient Instructions (Signed)
[facility] Discharge Instructions for Patients Receiving Chemotherapy  Today you received the following chemotherapy agents: alimta  To help prevent nausea and vomiting after your treatment, we encourage you to take your nausea medication.  Take it as often as prescribed.     If you develop nausea and vomiting that is not controlled by your nausea medication, call the clinic. If it is after clinic hours your family physician or the after hours number for the clinic or go to the Emergency Department.   BELOW ARE SYMPTOMS THAT SHOULD BE REPORTED IMMEDIATELY:  *FEVER GREATER THAN 100.5 F  *CHILLS WITH OR WITHOUT FEVER  NAUSEA AND VOMITING THAT IS NOT CONTROLLED WITH YOUR NAUSEA MEDICATION  *UNUSUAL SHORTNESS OF BREATH  *UNUSUAL BRUISING OR BLEEDING  TENDERNESS IN MOUTH AND THROAT WITH OR WITHOUT PRESENCE OF ULCERS  *URINARY PROBLEMS  *BOWEL PROBLEMS  UNUSUAL RASH Items with * indicate a potential emergency and should be followed up as soon as possible.  Feel free to call the clinic you have any questions or concerns. The clinic phone number is [phone].   I have been informed and understand all the instructions given to me. I know to contact the clinic, my physician, or go to the Emergency Department if any problems should occur. I do not have any questions at this time, but understand that I may call the clinic during office hours   should I have any questions or need assistance in obtaining follow up care.    __________________________________________  _____________  __________ Signature of Patient or Authorized Representative            Date                   Time    __________________________________________ Nurse's Signature    

## 2012-11-03 NOTE — Telephone Encounter (Signed)
gv and printed appt sched and avs for pt for pt for NOV and Dec....sed add tx.

## 2012-11-17 ENCOUNTER — Other Ambulatory Visit: Payer: Self-pay | Admitting: Family Medicine

## 2012-11-22 ENCOUNTER — Other Ambulatory Visit: Payer: Self-pay | Admitting: Family Medicine

## 2012-11-24 ENCOUNTER — Other Ambulatory Visit (HOSPITAL_BASED_OUTPATIENT_CLINIC_OR_DEPARTMENT_OTHER): Payer: Medicare Other | Admitting: Lab

## 2012-11-24 ENCOUNTER — Encounter: Payer: Self-pay | Admitting: *Deleted

## 2012-11-24 ENCOUNTER — Ambulatory Visit (HOSPITAL_BASED_OUTPATIENT_CLINIC_OR_DEPARTMENT_OTHER): Payer: Medicare Other

## 2012-11-24 ENCOUNTER — Other Ambulatory Visit: Payer: Self-pay | Admitting: Family Medicine

## 2012-11-24 ENCOUNTER — Telehealth: Payer: Self-pay | Admitting: Internal Medicine

## 2012-11-24 ENCOUNTER — Ambulatory Visit (HOSPITAL_BASED_OUTPATIENT_CLINIC_OR_DEPARTMENT_OTHER): Payer: Medicare Other | Admitting: Internal Medicine

## 2012-11-24 ENCOUNTER — Other Ambulatory Visit: Payer: Medicare Other | Admitting: Lab

## 2012-11-24 ENCOUNTER — Encounter: Payer: Self-pay | Admitting: Internal Medicine

## 2012-11-24 DIAGNOSIS — C7951 Secondary malignant neoplasm of bone: Secondary | ICD-10-CM | POA: Diagnosis not present

## 2012-11-24 DIAGNOSIS — C7931 Secondary malignant neoplasm of brain: Secondary | ICD-10-CM

## 2012-11-24 DIAGNOSIS — Z5111 Encounter for antineoplastic chemotherapy: Secondary | ICD-10-CM | POA: Diagnosis not present

## 2012-11-24 DIAGNOSIS — C3410 Malignant neoplasm of upper lobe, unspecified bronchus or lung: Secondary | ICD-10-CM

## 2012-11-24 DIAGNOSIS — C7A090 Malignant carcinoid tumor of the bronchus and lung: Secondary | ICD-10-CM

## 2012-11-24 LAB — CBC WITH DIFFERENTIAL/PLATELET
BASO%: 0.5 % (ref 0.0–2.0)
HCT: 40.4 % (ref 34.8–46.6)
HGB: 13.3 g/dL (ref 11.6–15.9)
MCHC: 32.9 g/dL (ref 31.5–36.0)
MONO#: 0.8 10*3/uL (ref 0.1–0.9)
NEUT%: 59.3 % (ref 38.4–76.8)
RBC: 4.54 10*6/uL (ref 3.70–5.45)
RDW: 15.3 % — ABNORMAL HIGH (ref 11.2–14.5)
WBC: 6.4 10*3/uL (ref 3.9–10.3)
lymph#: 1.7 10*3/uL (ref 0.9–3.3)
nRBC: 0 % (ref 0–0)

## 2012-11-24 LAB — COMPREHENSIVE METABOLIC PANEL (CC13)
ALT: 10 U/L (ref 0–55)
AST: 19 U/L (ref 5–34)
Albumin: 3.4 g/dL — ABNORMAL LOW (ref 3.5–5.0)
Alkaline Phosphatase: 83 U/L (ref 40–150)
Calcium: 10 mg/dL (ref 8.4–10.4)
Chloride: 109 mEq/L (ref 98–109)
Creatinine: 0.7 mg/dL (ref 0.6–1.1)
Glucose: 95 mg/dl (ref 70–140)
Potassium: 4.1 mEq/L (ref 3.5–5.1)
Sodium: 143 mEq/L (ref 136–145)

## 2012-11-24 MED ORDER — SODIUM CHLORIDE 0.9 % IV SOLN
Freq: Once | INTRAVENOUS | Status: AC
  Administered 2012-11-24: 10:00:00 via INTRAVENOUS

## 2012-11-24 MED ORDER — HEPARIN SOD (PORK) LOCK FLUSH 100 UNIT/ML IV SOLN
500.0000 [IU] | Freq: Once | INTRAVENOUS | Status: AC | PRN
  Administered 2012-11-24: 500 [IU]
  Filled 2012-11-24: qty 5

## 2012-11-24 MED ORDER — ALPRAZOLAM 0.25 MG PO TABS: 0.2500 mg | ORAL_TABLET | Freq: Every evening | ORAL | Status: DC | PRN

## 2012-11-24 MED ORDER — DEXAMETHASONE SODIUM PHOSPHATE 10 MG/ML IJ SOLN
10.0000 mg | Freq: Once | INTRAMUSCULAR | Status: AC
  Administered 2012-11-24: 10 mg via INTRAVENOUS

## 2012-11-24 MED ORDER — ONDANSETRON 8 MG/NS 50 ML IVPB
INTRAVENOUS | Status: AC
  Filled 2012-11-24: qty 8

## 2012-11-24 MED ORDER — SODIUM CHLORIDE 0.9 % IV SOLN
500.0000 mg/m2 | Freq: Once | INTRAVENOUS | Status: AC
  Administered 2012-11-24: 900 mg via INTRAVENOUS
  Filled 2012-11-24: qty 36

## 2012-11-24 MED ORDER — DEXAMETHASONE SODIUM PHOSPHATE 10 MG/ML IJ SOLN
INTRAMUSCULAR | Status: AC
  Filled 2012-11-24: qty 1

## 2012-11-24 MED ORDER — MORPHINE SULFATE 30 MG PO TABS: 30.0000 mg | ORAL_TABLET | ORAL | Status: DC | PRN

## 2012-11-24 MED ORDER — SODIUM CHLORIDE 0.9 % IJ SOLN
10.0000 mL | INTRAMUSCULAR | Status: DC | PRN
  Administered 2012-11-24: 10 mL
  Filled 2012-11-24: qty 10

## 2012-11-24 MED ORDER — ONDANSETRON 8 MG/50ML IVPB (CHCC)
8.0000 mg | Freq: Once | INTRAVENOUS | Status: AC
  Administered 2012-11-24: 8 mg via INTRAVENOUS

## 2012-11-24 MED ORDER — MORPHINE SULFATE ER 60 MG PO TBCR: 60.0000 mg | EXTENDED_RELEASE_TABLET | Freq: Two times a day (BID) | ORAL | Status: DC

## 2012-11-24 NOTE — Progress Notes (Signed)
11/24/12 @ 8:15 am, BMS [id number], PROs only:  Mrs. Shrewsbury into the [facility] for labs, MD visit and treatment with Alimta.  She completed the LCSS and [id number] Pros prior to any other activities. Assured all questions were answered and thanked patient for her continued support of the study.

## 2012-11-24 NOTE — Patient Instructions (Addendum)
Pemetrexed injection What is this medicine? PEMETREXED (PEM e TREX ed) is a chemotherapy drug. This medicine affects cells that are rapidly growing, such as cancer cells and cells in your mouth and stomach. It is usually used to treat lung cancers like non-small cell lung cancer and mesothelioma. It may also be used to treat other cancers. This medicine may be used for other purposes; ask your health care provider or pharmacist if you have questions. COMMON BRAND NAME(S): Alimta What should I tell my health care provider before I take this medicine? They need to know if you have any of these conditions: -if you frequently drink alcohol containing beverages -infection (especially a virus infection such as chickenpox, cold sores, or herpes) -kidney disease -liver disease -low blood counts, like low platelets, red bloods, or white blood cells -an unusual or allergic reaction to pemetrexed, mannitol, other medicines, foods, dyes, or preservatives -pregnant or trying to get pregnant -breast-feeding How should I use this medicine? This drug is given as an infusion into a vein. It is administered in a hospital or clinic by a specially trained health care professional. Talk to your pediatrician regarding the use of this medicine in children. Special care may be needed. Overdosage: If you think you have taken too much of this medicine contact a poison control center or emergency room at once. NOTE: This medicine is only for you. Do not share this medicine with others. What if I miss a dose? It is important not to miss your dose. Call your doctor or health care professional if you are unable to keep an appointment. What may interact with this medicine? -aspirin and aspirin-like medicines -medicines to increase blood counts like filgrastim, pegfilgrastim, sargramostim -methotrexate -NSAIDS, medicines for pain and inflammation, like ibuprofen or naproxen -probenecid -pyrimethamine -vaccines Talk to  your doctor or health care professional before taking any of these medicines: -acetaminophen -aspirin -ibuprofen -ketoprofen -naproxen This list may not describe all possible interactions. Give your health care provider a list of all the medicines, herbs, non-prescription drugs, or dietary supplements you use. Also tell them if you smoke, drink alcohol, or use illegal drugs. Some items may interact with your medicine. What should I watch for while using this medicine? Visit your doctor for checks on your progress. This drug may make you feel generally unwell. This is not uncommon, as chemotherapy can affect healthy cells as well as cancer cells. Report any side effects. Continue your course of treatment even though you feel ill unless your doctor tells you to stop. In some cases, you may be given additional medicines to help with side effects. Follow all directions for their use. Call your doctor or health care professional for advice if you get a fever, chills or sore throat, or other symptoms of a cold or flu. Do not treat yourself. This drug decreases your body's ability to fight infections. Try to avoid being around people who are sick. This medicine may increase your risk to bruise or bleed. Call your doctor or health care professional if you notice any unusual bleeding. Be careful brushing and flossing your teeth or using a toothpick because you may get an infection or bleed more easily. If you have any dental work done, tell your dentist you are receiving this medicine. Avoid taking products that contain aspirin, acetaminophen, ibuprofen, naproxen, or ketoprofen unless instructed by your doctor. These medicines may hide a fever. Call your doctor or health care professional if you get diarrhea or mouth sores. Do not treat  yourself. To protect your kidneys, drink water or other fluids as directed while you are taking this medicine. Men and women must use effective birth control while taking this  medicine. You may also need to continue using effective birth control for a time after stopping this medicine. Do not become pregnant while taking this medicine. Tell your doctor right away if you think that you or your partner might be pregnant. There is a potential for serious side effects to an unborn child. Talk to your health care professional or pharmacist for more information. Do not breast-feed an infant while taking this medicine. This medicine may lower sperm counts. What side effects may I notice from receiving this medicine? Side effects that you should report to your doctor or health care professional as soon as possible: -allergic reactions like skin rash, itching or hives, swelling of the face, lips, or tongue -low blood counts - this medicine may decrease the number of white blood cells, red blood cells and platelets. You may be at increased risk for infections and bleeding. -signs of infection - fever or chills, cough, sore throat, pain or difficulty passing urine -signs of decreased platelets or bleeding - bruising, pinpoint red spots on the skin, black, tarry stools, blood in the urine -signs of decreased red blood cells - unusually weak or tired, fainting spells, lightheadedness -breathing problems, like a dry cough -changes in emotions or moods -chest pain -confusion -diarrhea -high blood pressure -mouth or throat sores or ulcers -pain, swelling, warmth in the leg -pain on swallowing -swelling of the ankles, feet, hands -trouble passing urine or change in the amount of urine -vomiting -yellowing of the eyes or skin Side effects that usually do not require medical attention (report to your doctor or health care professional if they continue or are bothersome): -hair loss -loss of appetite -nausea -stomach upset This list may not describe all possible side effects. Call your doctor for medical advice about side effects. You may report side effects to FDA at  1-800-FDA-1088. Where should I keep my medicine? This drug is given in a hospital or clinic and will not be stored at home. NOTE: This sheet is a summary. It may not cover all possible information. If you have questions about this medicine, talk to your doctor, pharmacist, or health care provider.  2014, [employer]vier/Gold Standard. (2007-07-28 13:24:03) [facility] Discharge Instructions for Patients Receiving Chemotherapy  Today you received the following chemotherapy agents alimta.  To help prevent nausea and vomiting after your treatment, we encourage you to take your nausea medication zofran.   If you develop nausea and vomiting that is not controlled by your nausea medication, call the clinic.   BELOW ARE SYMPTOMS THAT SHOULD BE REPORTED IMMEDIATELY:  *FEVER GREATER THAN 100.5 F  *CHILLS WITH OR WITHOUT FEVER  NAUSEA AND VOMITING THAT IS NOT CONTROLLED WITH YOUR NAUSEA MEDICATION  *UNUSUAL SHORTNESS OF BREATH  *UNUSUAL BRUISING OR BLEEDING  TENDERNESS IN MOUTH AND THROAT WITH OR WITHOUT PRESENCE OF ULCERS  *URINARY PROBLEMS  *BOWEL PROBLEMS  UNUSUAL RASH Items with * indicate a potential emergency and should be followed up as soon as possible.  Feel free to call the clinic you have any questions or concerns. The clinic phone number is [phone].

## 2012-11-24 NOTE — Patient Instructions (Signed)
Current Therapy:  Maintenance chemotherapy with Alimta 500 mg per meter squared given every 3 weeks. The patient is status post 90 cycles.  CHEMOTHERAPY INTENT: Palliative/maintenance  CURRENT # OF CHEMOTHERAPY CYCLES: 91  CURRENT ANTIEMETICS: Zofran, dexamethasone and Compazine  CURRENT SMOKING STATUS: Quit smoking 10/07/2005  ORAL CHEMOTHERAPY AND CONSENT: None  CURRENT BISPHOSPHONATES USE: None  PAIN MANAGEMENT: 0/10 currently on morphine  NARCOTICS INDUCED CONSTIPATION: No constipation  LIVING WILL AND CODE STATUS: No CODE BLUE

## 2012-11-24 NOTE — Progress Notes (Signed)
[facility] [phone]   [phone]  OFFICE PROGRESS NOTE  [name], MD [address] [place] [address]  Diagnosis:  Metastatic non-small cell lung cancer initially diagnosed with locally advanced stage IIIB with right Pancoast tumor involving the vertebral body as well as the foraminal canal invasion with spinal cord compression in October of 2007.   Prior Therapy:  1. Status post concurrent chemoradiation with weekly carboplatin and paclitaxel, last dose was given November 18, 2005. 2. 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 3. Status post gamma knife radiotherapy to a solitary brain lesion located in the superior frontal area of the brain at [facility] in April of 2008. 4. Status post palliative radiotherapy to the lateral abdominal wall metastatic lesion under the care of Dr. Mitzi Hansen, completed March of 2009. 5. Status post 6 cycles of systemic chemotherapy with carboplatin and Alimta. Last dose was given July 26, 2007 with disease stabilization. 6. Gamma knife stereotactic radiotherapy to 2 brain lesions one involving the right frontal dural based as well as right parietal lesion performed on 05/07/2012 under the care of Dr. Atlee Abide at [facility].  Current Therapy:  Maintenance chemotherapy with Alimta 500 mg per meter squared given every 3 weeks. The patient is status post 90 cycles.   CHEMOTHERAPY INTENT: Palliative/maintenance  CURRENT # OF CHEMOTHERAPY CYCLES: 91  CURRENT ANTIEMETICS: Zofran, dexamethasone and Compazine  CURRENT SMOKING STATUS: Quit smoking 10/07/2005  ORAL CHEMOTHERAPY AND CONSENT: None  CURRENT BISPHOSPHONATES USE: None  PAIN MANAGEMENT: 0/10 currently on morphine  NARCOTICS INDUCED CONSTIPATION: No constipation  LIVING WILL AND CODE STATUS: No CODE BLUE   INTERVAL  HISTORY: Danielle [name] 62 y.o. female returns to the clinic today for followup visit. The patient is feeling fine today with no specific complaints. She is tolerating her maintenance treatment with single agent Alimta fairly well with no significant adverse effects except for mild nausea few days after her treatment. She had recent chest congestion and cold symptoms 2 weeks ago but currently resolving. The patient denied having any chest pain, shortness of breath, cough or hemoptysis. She denied having any fever or chills. The patient denied having any weight loss or night sweats.  MEDICAL HISTORY: Past Medical History  Diagnosis Date  . Hypertension   . Anemia   . H/O: pneumonia   . History of tobacco abuse quit 10/08    on nicotine patch  . Fibromyalgia   . Hypokalemia   . DJD (degenerative joint disease), cervical   . Thrush 12/11/2010  . 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 had chemo and radiation for lung ca    ALLERGIES:  is allergic to contrast media; co-trimoxazole injection; iohexol; and sulfa drugs cross reactors.  MEDICATIONS:  Current Outpatient Prescriptions  Medication Sig Dispense Refill  . ALPRAZolam (XANAX) 0.25 MG tablet Take 1 tablet (0.25 mg total) by mouth at bedtime as needed for sleep.  30 tablet  0  . antipyrine-benzocaine (AURALGAN) otic solution Place 1 drop into both ears daily.      . fluconazole (DIFLUCAN) 100 MG tablet Take 2 tablets by mouth on day one, then take 1 tablet by mouth daily until completed  16 tablet  0  . fluticasone (FLONASE) 50 MCG/ACT nasal spray Place 1-2 sprays into the nose daily.  16 g  11  . folic acid (FOLVITE) 1 MG tablet TAKE 1 TABLET BY MOUTH DAILY.  90 tablet  3  . lidocaine-prilocaine (EMLA) cream Apply topically as needed.  30 g  1  . lisinopril (PRINIVIL,ZESTRIL) 5 MG tablet TAKE 1 TABLET BY MOUTH EVERY DAY  90 tablet  1  . meclizine (ANTIVERT) 12.5 MG tablet Take 1-2 tablets (12.5-25 mg  total) by mouth 3 (three) times daily as needed.  30 tablet  1  . morphine (MS CONTIN) 60 MG 12 hr tablet Take 1 tablet (60 mg total) by mouth 2 (two) times daily.  60 tablet  0  . morphine (MSIR) 30 MG tablet Take 1 tablet (30 mg total) by mouth every 4 (four) hours as needed.  60 tablet  0  . olopatadine (PATANOL) 0.1 % ophthalmic solution Place 1 drop into both eyes 2 (two) times daily.  5 mL  11  . ondansetron (ZOFRAN) 8 MG tablet TAKE 1 TABLET (8 MG TOTAL) BY MOUTH EVERY 8 (EIGHT) HOURS AS NEEDED FOR NAUSEA.  30 tablet  2  . predniSONE (DELTASONE) 50 MG tablet 13 hrs, 7 hrs, and 1 hr before scheduled CT scans       No current facility-administered medications for this visit.    SURGICAL HISTORY:  Past Surgical History  Procedure Laterality Date  . Cervical laminectomy  1995  . Other surgical history  2008    Gamma knife surgery to remove brain met   . 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 Knee surgery  1990    Left x 2  . Portacath placement  -[name]    TIP IN CAVOATRIAL JUNCTION    REVIEW OF SYSTEMS:  Constitutional: negative Eyes: negative Ears, nose, mouth, throat, and face: negative Respiratory: negative Cardiovascular: negative Gastrointestinal: negative Genitourinary:negative Integument/breast: negative Hematologic/lymphatic: negative Musculoskeletal:negative Neurological: negative Behavioral/Psych: negative Endocrine: negative Allergic/Immunologic: negative   PHYSICAL EXAMINATION: General appearance: alert, cooperative and no distress Head: Normocephalic, without obvious abnormality, atraumatic Neck: no adenopathy, no JVD, supple, symmetrical, trachea midline and thyroid not enlarged, symmetric, no tenderness/mass/nodules Lymph nodes: Cervical, supraclavicular, and axillary nodes normal. Resp: clear to auscultation bilaterally Back: symmetric, no curvature. ROM normal. No CVA tenderness. Cardio: regular rate and rhythm, S1, S2  normal, no murmur, click, rub or gallop GI: soft, non-tender; bowel sounds normal; no masses,  no organomegaly Extremities: extremities normal, atraumatic, no cyanosis or edema Neurologic: Alert and oriented X 3, normal strength and tone. Normal symmetric reflexes. Normal coordination and gait  ECOG PERFORMANCE STATUS: 0 - Asymptomatic  Blood pressure 154/87, pulse 74, temperature 97.6 F (36.4 C), temperature source Oral, resp. rate 20, height 5\' 4"  (1.626 m), weight 153 lb (69.4 kg).  LABORATORY DATA: Lab Results  Component Value Date   WBC 6.4 11/24/2012   HGB 13.3 11/24/2012   HCT 40.4 11/24/2012   MCV 89.0 11/24/2012   PLT 327 11/24/2012      Chemistry      Component Value Date/Time   NA 143 11/03/2012 0859   NA 135 08/20/2011 0953   K 3.9 11/03/2012 0859   K 4.4 08/20/2011 0953   CL 106 06/30/2012 1004   CL 105 08/20/2011 0953   CO2 26 11/03/2012 0859   CO2 28 08/20/2011 0953   BUN 6.4* 11/03/2012 0859   BUN 13 08/20/2011 0953   CREATININE 0.8 11/03/2012 0859   CREATININE 0.80 08/20/2011 0953      Component Value Date/Time  CALCIUM 9.9 11/03/2012 0859   CALCIUM 9.4 08/20/2011 0953   ALKPHOS 82 11/03/2012 0859   ALKPHOS 73 08/20/2011 0953   AST 16 11/03/2012 0859   AST 16 08/20/2011 0953   ALT 9 11/03/2012 0859   ALT 9 08/20/2011 0953   BILITOT 0.31 11/03/2012 0859   BILITOT 0.5 08/20/2011 0953       RADIOGRAPHIC STUDIES:  ASSESSMENT AND PLAN: This is a very pleasant 62 years old white female with metastatic non-small cell lung cancer currently undergoing maintenance chemotherapy with single agent Alimta status post 90 cycles. The patient is tolerating her treatment fairly well with no significant adverse effects.  I recommended for her to continue on maintenance and Alimta as scheduled. The patient will receive cycle #90 today. She would come back for followup visit in 3 weeks with the next cycle of her chemotherapy after repeating CT scan of the chest, abdomen and  pelvis for restaging of her disease. She was advised to call immediately if she has any concerning symptoms in the interval. The patient voices understanding of current disease status and treatment options and is in agreement with the current care plan.  All questions were answered. The patient knows to call the clinic with any problems, questions or concerns. We can certainly see the patient much sooner if necessary.

## 2012-11-24 NOTE — Telephone Encounter (Signed)
Appts made per 11/18 POF Barium given AVS and CAL given shh

## 2012-11-26 NOTE — Telephone Encounter (Signed)
Pt said [facility] [place] does not have lisinopril refill and pt is out of med. Spoke with Corrie Dandy at [facility] and she did not receive refill; Medication phoned to pharmacy as instructed.  Pt notified can pick up rx in one hr per [facility]. Apologized for inconvenience.

## 2012-12-11 ENCOUNTER — Ambulatory Visit (HOSPITAL_COMMUNITY): Payer: Medicare Other

## 2012-12-11 ENCOUNTER — Encounter (HOSPITAL_COMMUNITY): Payer: Self-pay

## 2012-12-11 ENCOUNTER — Ambulatory Visit (HOSPITAL_COMMUNITY)
Admission: RE | Admit: 2012-12-11 | Discharge: 2012-12-11 | Disposition: A | Discharge status: Home / Self Care | Payer: Medicare Other | Source: Ambulatory Visit | Attending: Internal Medicine | Admitting: Internal Medicine

## 2012-12-11 DIAGNOSIS — N3289 Other specified disorders of bladder: Secondary | ICD-10-CM | POA: Insufficient documentation

## 2012-12-11 DIAGNOSIS — N289 Disorder of kidney and ureter, unspecified: Secondary | ICD-10-CM | POA: Diagnosis not present

## 2012-12-11 DIAGNOSIS — Z79899 Other long term (current) drug therapy: Secondary | ICD-10-CM | POA: Insufficient documentation

## 2012-12-11 DIAGNOSIS — I700 Atherosclerosis of aorta: Secondary | ICD-10-CM | POA: Diagnosis not present

## 2012-12-11 DIAGNOSIS — K5730 Diverticulosis of large intestine without perforation or abscess without bleeding: Secondary | ICD-10-CM | POA: Diagnosis not present

## 2012-12-11 DIAGNOSIS — C3490 Malignant neoplasm of unspecified part of unspecified bronchus or lung: Secondary | ICD-10-CM | POA: Diagnosis not present

## 2012-12-11 DIAGNOSIS — C7931 Secondary malignant neoplasm of brain: Secondary | ICD-10-CM | POA: Insufficient documentation

## 2012-12-11 DIAGNOSIS — I2510 Atherosclerotic heart disease of native coronary artery without angina pectoris: Secondary | ICD-10-CM | POA: Diagnosis not present

## 2012-12-11 DIAGNOSIS — C7951 Secondary malignant neoplasm of bone: Secondary | ICD-10-CM | POA: Insufficient documentation

## 2012-12-11 MED ORDER — IOHEXOL 300 MG/ML  SOLN
100.0000 mL | Freq: Once | INTRAMUSCULAR | Status: AC | PRN
  Administered 2012-12-11: 100 mL via INTRAVENOUS

## 2012-12-15 ENCOUNTER — Other Ambulatory Visit: Payer: Medicare Other | Admitting: Lab

## 2012-12-15 ENCOUNTER — Encounter: Payer: Self-pay | Admitting: Internal Medicine

## 2012-12-15 ENCOUNTER — Telehealth: Payer: Self-pay | Admitting: *Deleted

## 2012-12-15 ENCOUNTER — Ambulatory Visit (HOSPITAL_BASED_OUTPATIENT_CLINIC_OR_DEPARTMENT_OTHER): Payer: Medicare Other

## 2012-12-15 ENCOUNTER — Other Ambulatory Visit (HOSPITAL_BASED_OUTPATIENT_CLINIC_OR_DEPARTMENT_OTHER): Payer: Medicare Other | Admitting: Lab

## 2012-12-15 ENCOUNTER — Encounter: Payer: Self-pay | Admitting: *Deleted

## 2012-12-15 ENCOUNTER — Ambulatory Visit (HOSPITAL_BASED_OUTPATIENT_CLINIC_OR_DEPARTMENT_OTHER): Payer: Medicare Other | Admitting: Internal Medicine

## 2012-12-15 ENCOUNTER — Telehealth: Payer: Self-pay | Admitting: Internal Medicine

## 2012-12-15 DIAGNOSIS — C7931 Secondary malignant neoplasm of brain: Secondary | ICD-10-CM | POA: Diagnosis not present

## 2012-12-15 DIAGNOSIS — C3410 Malignant neoplasm of upper lobe, unspecified bronchus or lung: Secondary | ICD-10-CM | POA: Diagnosis not present

## 2012-12-15 DIAGNOSIS — C7951 Secondary malignant neoplasm of bone: Secondary | ICD-10-CM | POA: Diagnosis not present

## 2012-12-15 DIAGNOSIS — Z5111 Encounter for antineoplastic chemotherapy: Secondary | ICD-10-CM | POA: Diagnosis not present

## 2012-12-15 DIAGNOSIS — C7A090 Malignant carcinoid tumor of the bronchus and lung: Secondary | ICD-10-CM

## 2012-12-15 LAB — CBC WITH DIFFERENTIAL/PLATELET
BASO%: 0.6 % (ref 0.0–2.0)
Basophils Absolute: 0.1 10*3/uL (ref 0.0–0.1)
EOS%: 0.2 % (ref 0.0–7.0)
HCT: 38.2 % (ref 34.8–46.6)
HGB: 12.4 g/dL (ref 11.6–15.9)
LYMPH%: 9 % — ABNORMAL LOW (ref 14.0–49.7)
MCH: 28.8 pg (ref 25.1–34.0)
MCHC: 32.4 g/dL (ref 31.5–36.0)
MCV: 88.9 fL (ref 79.5–101.0)
MONO%: 11.6 % (ref 0.0–14.0)
NEUT%: 78.6 % — ABNORMAL HIGH (ref 38.4–76.8)
Platelets: 412 10*3/uL — ABNORMAL HIGH (ref 145–400)
RBC: 4.3 10*6/uL (ref 3.70–5.45)

## 2012-12-15 LAB — COMPREHENSIVE METABOLIC PANEL (CC13)
Albumin: 2.8 g/dL — ABNORMAL LOW (ref 3.5–5.0)
Alkaline Phosphatase: 85 U/L (ref 40–150)
Anion Gap: 15 mEq/L — ABNORMAL HIGH (ref 3–11)
BUN: 14.9 mg/dL (ref 7.0–26.0)
Creatinine: 0.8 mg/dL (ref 0.6–1.1)
Glucose: 212 mg/dl — ABNORMAL HIGH (ref 70–140)
Potassium: 4.1 mEq/L (ref 3.5–5.1)
Total Bilirubin: 0.63 mg/dL (ref 0.20–1.20)
Total Protein: 7.4 g/dL (ref 6.4–8.3)

## 2012-12-15 MED ORDER — HEPARIN SOD (PORK) LOCK FLUSH 100 UNIT/ML IV SOLN
500.0000 [IU] | Freq: Once | INTRAVENOUS | Status: AC | PRN
  Administered 2012-12-15: 500 [IU]
  Filled 2012-12-15: qty 5

## 2012-12-15 MED ORDER — ONDANSETRON 8 MG/50ML IVPB (CHCC)
8.0000 mg | Freq: Once | INTRAVENOUS | Status: AC
  Administered 2012-12-15: 8 mg via INTRAVENOUS

## 2012-12-15 MED ORDER — DEXAMETHASONE SODIUM PHOSPHATE 10 MG/ML IJ SOLN
INTRAMUSCULAR | Status: AC
  Filled 2012-12-15: qty 1

## 2012-12-15 MED ORDER — DEXAMETHASONE SODIUM PHOSPHATE 10 MG/ML IJ SOLN
10.0000 mg | Freq: Once | INTRAMUSCULAR | Status: AC
Start: 2012-12-15 — End: 2012-12-15
  Administered 2012-12-15: 10 mg via INTRAVENOUS

## 2012-12-15 MED ORDER — SODIUM CHLORIDE 0.9 % IV SOLN
500.0000 mg/m2 | Freq: Once | INTRAVENOUS | Status: AC
  Administered 2012-12-15: 900 mg via INTRAVENOUS
  Filled 2012-12-15: qty 36

## 2012-12-15 MED ORDER — ONDANSETRON 8 MG/NS 50 ML IVPB
INTRAVENOUS | Status: AC
  Filled 2012-12-15: qty 8

## 2012-12-15 MED ORDER — SODIUM CHLORIDE 0.9 % IV SOLN
Freq: Once | INTRAVENOUS | Status: AC
  Administered 2012-12-15: 09:00:00 via INTRAVENOUS

## 2012-12-15 MED ORDER — SODIUM CHLORIDE 0.9 % IJ SOLN
10.0000 mL | INTRAMUSCULAR | Status: DC | PRN
  Administered 2012-12-15: 10 mL
  Filled 2012-12-15: qty 10

## 2012-12-15 NOTE — Progress Notes (Signed)
12/15/12 @ 8:40 am, BMS [id number], Research Labs and PROs:   Mrs. Domzalski into the [facility] to see Dr. Arbutus Ped.  She had research labs and answered PROs prior to her visit.  She had CT scans for restaging on 12/11/12, which showed stable disease.  Thanked her for her continued support of the study.

## 2012-12-15 NOTE — Progress Notes (Signed)
[name] [phone]   [phone]  OFFICE PROGRESS NOTE  [name], MD [address] [place] [address]  Diagnosis:  Metastatic non-small cell lung cancer initially diagnosed with locally advanced stage IIIB with right Pancoast tumor involving the vertebral body as well as the foraminal canal invasion with spinal cord compression in October of 2007.   Prior Therapy:  1. Status post concurrent chemoradiation with weekly carboplatin and paclitaxel, last dose was given November 18, 2005. 2. 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 3. Status post gamma knife radiotherapy to a solitary brain lesion located in the superior frontal area of the brain at [facility] in April of 2008. 4. Status post palliative radiotherapy to the lateral abdominal wall metastatic lesion under the care of Dr. Mitzi Hansen, completed March of 2009. 5. Status post 6 cycles of systemic chemotherapy with carboplatin and Alimta. Last dose was given July 26, 2007 with disease stabilization. 6. Gamma knife stereotactic radiotherapy to 2 brain lesions one involving the right frontal dural based as well as right parietal lesion performed on 05/07/2012 under the care of Dr. Atlee Abide at [facility].  Current Therapy:  Maintenance chemotherapy with Alimta 500 mg per meter squared given every 3 weeks. The patient is status post 91 cycles.   CHEMOTHERAPY INTENT: Palliative/maintenance  CURRENT # OF CHEMOTHERAPY CYCLES: 92  CURRENT ANTIEMETICS: Zofran, dexamethasone and Compazine  CURRENT SMOKING STATUS: Quit smoking 10/07/2005  ORAL CHEMOTHERAPY AND CONSENT: None  CURRENT BISPHOSPHONATES USE: None  PAIN MANAGEMENT: 0/10 currently on morphine  NARCOTICS INDUCED CONSTIPATION: No constipation  LIVING WILL AND CODE STATUS: No CODE BLUE   INTERVAL  HISTORY: Danielle [name] 62 y.o. female returns to the clinic today for followup visit accompanied by her sister. The patient is feeling fine today with no specific complaints. She is tolerating her maintenance treatment with single agent Alimta fairly well with no significant adverse effects. She had recent chest congestion and cold symptoms stated a weeks ago but currently resolving. The patient denied having any chest pain, shortness of breath, cough or hemoptysis. She denied having any fever or chills, no nausea or vomiting. The patient denied having any weight loss or night sweats. She had repeat CT scan of the chest, abdomen and pelvis performed recently and she is here for evaluation and discussion of her scan results.  MEDICAL HISTORY: Past Medical History  Diagnosis Date  . Hypertension   . Anemia   . H/O: pneumonia   . History of tobacco abuse quit 10/08    on nicotine patch  . Fibromyalgia   . Hypokalemia   . DJD (degenerative joint disease), cervical   . Thrush 12/11/2010  . 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 had chemo and radiation for lung ca    ALLERGIES:  is allergic to contrast media; co-trimoxazole injection; iohexol; and sulfa drugs cross reactors.  MEDICATIONS:  Current Outpatient Prescriptions  Medication Sig Dispense Refill  . ALPRAZolam (XANAX) 0.25 MG tablet Take 1 tablet (0.25 mg total) by mouth at bedtime as needed for sleep.  30 tablet  0  . antipyrine-benzocaine (AURALGAN) otic solution Place 1 drop into both ears daily.      . fluconazole (DIFLUCAN) 100 MG tablet Take 2 tablets by mouth on day one, then take 1 tablet by mouth daily until  completed  16 tablet  0  . fluticasone (FLONASE) 50 MCG/ACT nasal spray Place 1-2 sprays into the nose daily.  16 g  11  . folic acid (FOLVITE) 1 MG tablet TAKE 1 TABLET BY MOUTH DAILY.  90 tablet  3  . lidocaine-prilocaine (EMLA) cream Apply topically as needed.  30 g  1  . lisinopril  (PRINIVIL,ZESTRIL) 5 MG tablet TAKE 1 TABLET BY MOUTH EVERY DAY  90 tablet  1  . meclizine (ANTIVERT) 12.5 MG tablet Take 1-2 tablets (12.5-25 mg total) by mouth 3 (three) times daily as needed.  30 tablet  1  . morphine (MS CONTIN) 60 MG 12 hr tablet Take 1 tablet (60 mg total) by mouth 2 (two) times daily.  60 tablet  0  . morphine (MSIR) 30 MG tablet Take 1 tablet (30 mg total) by mouth every 4 (four) hours as needed.  60 tablet  0  . olopatadine (PATANOL) 0.1 % ophthalmic solution Place 1 drop into both eyes 2 (two) times daily.  5 mL  11  . ondansetron (ZOFRAN) 8 MG tablet TAKE 1 TABLET (8 MG TOTAL) BY MOUTH EVERY 8 (EIGHT) HOURS AS NEEDED FOR NAUSEA.  30 tablet  2  . predniSONE (DELTASONE) 50 MG tablet 13 hrs, 7 hrs, and 1 hr before scheduled CT scans       No current facility-administered medications for this visit.    SURGICAL HISTORY:  Past Surgical History  Procedure Laterality Date  . Cervical laminectomy  1995  . Other surgical history  2008    Gamma knife surgery to remove brain met   . 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 Knee surgery  1990    Left x 2  . Portacath placement  -[name]    TIP IN CAVOATRIAL JUNCTION    REVIEW OF SYSTEMS:  Constitutional: negative Eyes: negative Ears, nose, mouth, throat, and face: negative Respiratory: negative Cardiovascular: negative Gastrointestinal: negative Genitourinary:negative Integument/breast: negative Hematologic/lymphatic: negative Musculoskeletal:negative Neurological: negative Behavioral/Psych: negative Endocrine: negative Allergic/Immunologic: negative   PHYSICAL EXAMINATION: General appearance: alert, cooperative and no distress Head: Normocephalic, without obvious abnormality, atraumatic Neck: no adenopathy, no JVD, supple, symmetrical, trachea midline and thyroid not enlarged, symmetric, no tenderness/mass/nodules Lymph nodes: Cervical, supraclavicular, and axillary nodes  normal. Resp: clear to auscultation bilaterally Back: symmetric, no curvature. ROM normal. No CVA tenderness. Cardio: regular rate and rhythm, S1, S2 normal, no murmur, click, rub or gallop GI: soft, non-tender; bowel sounds normal; no masses,  no organomegaly Extremities: extremities normal, atraumatic, no cyanosis or edema Neurologic: Alert and oriented X 3, normal strength and tone. Normal symmetric reflexes. Normal coordination and gait  ECOG PERFORMANCE STATUS: 0 - Asymptomatic  Blood pressure 126/78, pulse 116, temperature 97.7 F (36.5 C), temperature source Oral, resp. rate 18, height 5\' 4"  (1.626 m), weight 147 lb 6.4 oz (66.86 kg), SpO2 99.00%.  LABORATORY DATA: Lab Results  Component Value Date   WBC 16.9* 12/15/2012   HGB 12.4 12/15/2012   HCT 38.2 12/15/2012   MCV 88.9 12/15/2012   PLT 412* 12/15/2012      Chemistry      Component Value Date/Time   NA 142 12/15/2012 0811   NA 135 08/20/2011 0953   K 4.1 12/15/2012 0811   K 4.4 08/20/2011 0953   CL 106 06/30/2012 1004   CL 105 08/20/2011 0953   CO2 25 12/15/2012 0811   CO2 28 08/20/2011 0953   BUN  14.9 12/15/2012 0811   BUN 13 08/20/2011 0953   CREATININE 0.8 12/15/2012 0811   CREATININE 0.80 08/20/2011 0953      Component Value Date/Time   CALCIUM 10.2 12/15/2012 0811   CALCIUM 9.4 08/20/2011 0953   ALKPHOS 85 12/15/2012 0811   ALKPHOS 73 08/20/2011 0953   AST 18 12/15/2012 0811   AST 16 08/20/2011 0953   ALT 15 12/15/2012 0811   ALT 9 08/20/2011 0953   BILITOT 0.63 12/15/2012 0811   BILITOT 0.5 08/20/2011 0953       RADIOGRAPHIC STUDIES: Ct Chest W Contrast  12/11/2012   CLINICAL DATA:  Restaging examination. Patient with lung cancer, bone, brain mets. Ongoing chemotherapy.  EXAM: CT CHEST, ABDOMEN, AND PELVIS WITH CONTRAST  TECHNIQUE: Multidetector CT imaging of the chest, abdomen and pelvis was performed following the standard protocol during bolus administration of intravenous contrast.  CONTRAST:  OMNIPAQUE  IOHEXOL 300 MG/ML  SOLN  COMPARISON:  Prior CT 10/09/2012.  FINDINGS:   CT CHEST FINDINGS  No enlarged axillary, mediastinal or hilar lymphadenopathy. Normal heart size. No pericardial effusion. Coronary artery calcifications. Additionally there are atherosclerotic calcifications and plaque involving the thoracic aorta as well as the origin of the right common carotid artery. Right anterior chest wall Port-A-Cath is present with tip terminating at the right superior cavoatrial junction. Esophagus is grossly unremarkable.  Central airways are patent. Extensive centrilobular emphysematous change. Grossly stable subpleural mass within the right upper lobe measuring 2.7 x 1.2 cm, previously 2.9 x 1.1 cm (image 9; series 2). Re- demonstrated sequelae of radiation therapy within the right upper lobe. No pleural effusion or pneumothorax.    CT ABDOMEN AND PELVIS FINDINGS  Liver is normal in size and contour without focal hepatic lesion identified. Portal and hepatic veins are patent. Gallbladder is unremarkable. No intrahepatic or extrahepatic biliary ductal dilatation. The spleen, pancreas and bilateral adrenal glands are unremarkable. Kidneys enhance symmetrically with contrast. Unchanged sub cm low-attenuation lesion inferior pole left kidney. There is circumferential enhancement of the urothelium from the level of the bilateral renal collecting systems to the level of the bladder. Additionally there is circumferential bladder wall thickening and hyper enhancement of the urinary bladder mucosa.  Scattered calcified atherosclerotic plaque involving otherwise normal caliber abdominal aorta. No retroperitoneal lymphadenopathy. The uterus and adnexa are unremarkable. Descending and sigmoid colonic diverticulosis. No evidence for acute diverticulitis. Unchanged sigmoid colonic wall thickening. Unchanged 5 mm low-density nodule adjacent to the descending colon (image 90; series 2). No free fluid or free intraperitoneal  air.  Lower lumbar spine degenerative change. The bones are relative osteopenic. Re- demonstrated kyphoplasty material within the T4 and T7 vertebral bodies. Persistent vertebral plana of the T3 vertebral body.    IMPRESSION: 1. No significant interval change in subpleural nodular thickening within the right upper lobe. No new pulmonary nodules. No lymphadenopathy. 2. No evidence for metastatic disease within the abdomen or pelvis. 3. Enhancement of the urothelium from the level of the renal collecting systems to the bladder which also demonstrates enhancement and mild wall thickening. Findings are nonspecific and may be secondary to cystitis or potentially treatment related. Recommend clinical correlation and correlation with urinalysis.   Electronically Signed   By: Annia Belt M.D.   On: 12/11/2012 08:47   ASSESSMENT AND PLAN: This is a very pleasant 62 years old white female with metastatic non-small cell lung cancer currently undergoing maintenance chemotherapy with single agent Alimta status post 91 cycles. The patient is tolerating her treatment  fairly well with no significant adverse effects.  Her recent scan showed no evidence for disease progression. I discussed the scan results with the patient and her sister. I recommended for her to continue on maintenance and Alimta as scheduled. The patient will receive cycle #92 today. She would come back for followup visit in 3 weeks with the next cycle of her chemotherapy. She was advised to call immediately if she has any concerning symptoms in the interval. The patient voices understanding of current disease status and treatment options and is in agreement with the current care plan.  All questions were answered. The patient knows to call the clinic with any problems, questions or concerns. We can certainly see the patient much sooner if necessary.   [facility]  Telephone:(336) [phone] Fax:(336) [phone]  OFFICE PROGRESS NOTE    [name], MD  [address]  [place] [address]  Diagnosis:  Metastatic non-small cell lung cancer initially diagnosed with locally advanced stage IIIB with right Pancoast tumor involving the vertebral body as well as the foraminal canal invasion with spinal cord compression in October of 2007.  Prior Therapy:  7. Status post concurrent chemoradiation with weekly carboplatin and paclitaxel, last dose was given November 18, 2005. 8. 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 9. Status post gamma knife radiotherapy to a solitary brain lesion located in the superior frontal area of the brain at [facility] in April of 2008. 10. Status post palliative radiotherapy to the lateral abdominal wall metastatic lesion under the care of Dr. Mitzi Hansen, completed March of 2009. 11. Status post 6 cycles of systemic chemotherapy with carboplatin and Alimta. Last dose was given July 26, 2007 with disease stabilization. 12. Gamma knife stereotactic radiotherapy to 2 brain lesions one involving the right frontal dural based as well as right parietal lesion performed on 05/07/2012 under the care of Dr. Atlee Abide at [facility].  Current Therapy:  Maintenance chemotherapy with Alimta 500 mg per meter squared given every 3 weeks. The patient is status post 88cycles.  CHEMOTHERAPY INTENT: Palliative/maintenance  CURRENT # OF CHEMOTHERAPY CYCLES: 89  CURRENT ANTIEMETICS: Zofran, dexamethasone and Compazine  CURRENT SMOKING STATUS: Quit smoking 10/07/2005  ORAL CHEMOTHERAPY AND CONSENT: None  CURRENT BISPHOSPHONATES USE: None  PAIN MANAGEMENT: 0/10 currently on morphine  NARCOTICS INDUCED CONSTIPATION: No constipation  LIVING WILL AND CODE STATUS: No CODE BLUE  INTERVAL HISTORY:  Danielle [name] 62 y.o. female returns to the clinic today for followup visit accompanied her sister. The  patient is feeling fine today with no specific complaints. She is tolerating her maintenance treatment with single agent Alimta fairly well with no significant adverse effects except for mild nausea 4 days after her treatment. The patient denied having any chest pain, shortness breath, cough or hemoptysis. She intentionally lost around 4 pounds since her last visit. The patient was seen recently by Dr. Johny Drilling at [facility] for evaluation of her brain lesions and MRI of the brain showed no active metastatic disease to the brain. The patient had repeat CT scan of the chest, abdomen and pelvis performed recently and she is here for evaluation and discussion of her scan results.  MEDICAL HISTORY:  Past Medical History   Diagnosis  Date   .  Hypertension    .  Anemia    .  H/O: pneumonia    .  History of  tobacco abuse  quit 10/08     on nicotine patch   .  Fibromyalgia    .  Hypokalemia    .  DJD (degenerative joint disease), cervical    .  Thrush  12/11/2010   .  Lung cancer  dx'd 09/2005     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 had chemo and radiation for lung ca   ALLERGIES: is allergic to contrast media; co-trimoxazole injection; iohexol; and sulfa drugs cross reactors.  MEDICATIONS:  Current Outpatient Prescriptions   Medication  Sig  Dispense  Refill   .  ALPRAZolam (XANAX) 0.25 MG tablet  Take 1 tablet (0.25 mg total) by mouth at bedtime as needed for sleep.  30 tablet  0   .  antipyrine-benzocaine (AURALGAN) otic solution  Place 1 drop into both ears daily.     .  fluconazole (DIFLUCAN) 100 MG tablet  Take 2 tablets by mouth on day one, then take 1 tablet by mouth daily until completed  16 tablet  0   .  fluticasone (FLONASE) 50 MCG/ACT nasal spray  Place 1-2 sprays into the nose daily.  16 g  11   .  folic acid (FOLVITE) 1 MG tablet  TAKE 1 TABLET BY MOUTH DAILY.  90 tablet  3   .  lidocaine-prilocaine (EMLA) cream  Apply topically as needed.  30 g  1   .  lisinopril  (PRINIVIL,ZESTRIL) 5 MG tablet  TAKE 1 TABLET BY MOUTH EVERY DAY  90 tablet  0   .  meclizine (ANTIVERT) 12.5 MG tablet  Take 1-2 tablets (12.5-25 mg total) by mouth 3 (three) times daily as needed.  30 tablet  1   .  morphine (MS CONTIN) 60 MG 12 hr tablet  Take 1 tablet (60 mg total) by mouth 2 (two) times daily.  60 tablet  0   .  morphine (MSIR) 30 MG tablet  Take 1 tablet (30 mg total) by mouth every 4 (four) hours as needed.  30 tablet  0   .  olopatadine (PATANOL) 0.1 % ophthalmic solution  Place 1 drop into both eyes 2 (two) times daily.  5 mL  11   .  ondansetron (ZOFRAN) 8 MG tablet  TAKE 1 TABLET (8 MG TOTAL) BY MOUTH EVERY 8 (EIGHT) HOURS AS NEEDED FOR NAUSEA.  30 tablet  2   .  predniSONE (DELTASONE) 50 MG tablet  13 hrs, 7 hrs, and 1 hr before scheduled CT scans      No current facility-administered medications for this visit.   SURGICAL HISTORY:  Past Surgical History   Procedure  Laterality  Date   .  Cervical laminectomy   1995   .  Other surgical history   2008     Gamma knife surgery to remove brain met   .  Kyphosis surgery   7/08     because lung ca grew into spinal canal   .  Knee surgery   1990     Left x 2   .  Portacath placement   -[name]     TIP IN CAVOATRIAL JUNCTION   REVIEW OF SYSTEMS: Constitutional: negative  Eyes: negative  Ears, nose, mouth, throat, and face: negative  Respiratory: negative  Cardiovascular: negative  Gastrointestinal: negative  Genitourinary:negative  Integument/breast: negative  Hematologic/lymphatic: negative  Musculoskeletal:negative  Neurological: negative  Behavioral/Psych: negative  Endocrine: negative  Allergic/Immunologic: negative  PHYSICAL EXAMINATION: General appearance: alert, cooperative and no distress  Head: Normocephalic, without obvious abnormality, atraumatic  Neck: no adenopathy, no JVD, supple, symmetrical, trachea midline and thyroid not enlarged, symmetric, no tenderness/mass/nodules  Lymph nodes:  Cervical, supraclavicular, and axillary nodes normal.  Resp: clear to auscultation bilaterally  Back: symmetric, no curvature. ROM normal. No CVA tenderness.  Cardio: regular rate and rhythm, S1, S2 normal, no murmur, click, rub or gallop  GI: soft, non-tender; bowel sounds normal; no masses, no organomegaly  Extremities: extremities normal, atraumatic, no cyanosis or edema  Neurologic: Alert and oriented X 3, normal strength and tone. Normal symmetric reflexes. Normal coordination and gait  ECOG PERFORMANCE STATUS: 0 - Asymptomatic  Blood pressure 152/77, pulse 75, temperature 98.4 F (36.9 C), temperature source Oral, resp. rate 18, height 5\' 4"  (1.626 m), weight 155 lb 3.2 oz (70.398 kg), SpO2 99.00%.  LABORATORY DATA:  Lab Results   Component  Value  Date    WBC  7.4  10/13/2012    HGB  12.5  10/13/2012    HCT  37.5  10/13/2012    MCV  87.3  10/13/2012    PLT  307  10/13/2012    Chemistry       Component  Value  Date/Time  Component  Value  Date/Time    NA  142  09/22/2012 0909   CALCIUM  9.8  09/22/2012 0909    NA  135  08/20/2011 0953   CALCIUM  9.4  08/20/2011 0953    K  4.6  09/22/2012 0909   ALKPHOS  76  09/22/2012 0909    K  4.4  08/20/2011 0953   ALKPHOS  73  08/20/2011 0953    CL  106  06/30/2012 1004   AST  14  09/22/2012 0909    CL  105  08/20/2011 0953   AST  16  08/20/2011 0953    CO2  28  09/22/2012 0909   ALT  8  09/22/2012 0909    CO2  28  08/20/2011 0953   ALT  9  08/20/2011 0953    BUN  7.5  09/22/2012 0909   BILITOT  0.32  09/22/2012 0909    BUN  13  08/20/2011 0953   BILITOT  0.5  08/20/2011 0953    CREATININE  0.8  09/22/2012 0909     CREATININE  0.80  08/20/2011 0953    RADIOGRAPHIC STUDIES:  Ct Chest W Contrast  10/09/2012 EXAM: CT CHEST, ABDOMEN, AND PELVIS WITH CONTRAST TECHNIQUE: Multidetector CT imaging of the chest, abdomen and pelvis was performed following the standard protocol during bolus administration of intravenous contrast. CONTRAST: OMNIPAQUE IOHEXOL 300 MG/ML  SOLN COMPARISON: 08/07/12 FINDINGS:  CT CHEST FINDINGS There is no pleural effusion identified. Moderate changes of centrilobular emphysema identified. Subpleural soft tissue attenuating density in the right upper lobe measures 2.9 x 1.1 cm and appears stable from previous exam, image 8/series 2. Radiation change within the paramediastinal right upper lobe appears similar to the previous exam. No new or enlarging pulmonary nodules or mass is identified. The trachea appears patent and is midline. The heart size appears within normal limits. Calcifications within the LAD coronary artery noted. No mediastinal or hilar adenopathy identified. There is no axillary or supraclavicular adenopathy. The bones appear osteopenic. There are no aggressive lytic or sclerotic bone lesions identified. Treated compression deformities at T4 and T7 are again noted. The T3 vertebra plana deformity is similar to previous exam. No aggressive lytic or sclerotic bone lesions identified.  CT ABDOMEN AND  PELVIS FINDINGS There is no suspicious liver abnormality identified. The gallbladder appears normal. No biliary dilatation. Normal appearance of the pancreas. The spleen is on unremarkable. The adrenal glands are both normal. Right kidney appears within normal limits. The left kidney is also stress set there is a small cyst identified within the inferior pole of the left kidney. The urinary bladder appears normal. The uterus and adnexal structures are negative. The stomach and the small bowel loops have a normal course and caliber. The appendix is visualized and appears normal. Normal appearance of the proximal colon. Mild wall thickening involving the sigmoid colon is identified. This is nonspecific and may reflect incomplete distention. No free fluid or abnormal fluid collections noted within the abdomen or pelvis. The abdominal aorta has a normal caliber. No upper abdominal adenopathy. There is no pelvic or inguinal adenopathy noted. Review  of the visualized osseous structures is negative for aggressive lytic or sclerotic bone lesion.  IMPRESSION: CT CHEST IMPRESSION 1. No acute findings identified within the chest. There are no specific features identified to suggest residual or recurrence of tumor. 2. Stable nodular pleural thickening within the right lung apex. CT ABDOMEN AND PELVIS IMPRESSION 1. No acute findings and no evidence for metastatic disease to the abdomen or pelvis. CLINICAL DATA: Followup lung cancer Electronically Signed By: Signa Kell M.D. On: 10/09/2012 11:38   ASSESSMENT AND PLAN: This is a very pleasant 62 years old white female with metastatic non-small cell lung cancer currently undergoing maintenance chemotherapy with single agent Alimta status post 88 cycles.  The patient is tolerating her treatment fairly well with no significant adverse effects. The recent CT scan of the chest, abdomen and pelvis showed no evidence for disease progression in the chest, abdomen or pelvis.  I discussed the scan results with the patient and her sister. I recommended for her to continue on maintenance and Alimta as scheduled. The patient will receive cycle #89 today.  She would come back for followup visit in 3 weeks with the next cycle of her chemotherapy.  She was advised to call immediately if she has any concerning symptoms in the interval.  The patient voices understanding of current disease status and treatment options and is in agreement with the current care plan.  All questions were answered. The patient knows to call the clinic with any problems, questions or concerns. We can certainly see the patient much sooner if necessary.  I spent 15 minutes counseling the patient face to face. The total time spent in the appointment was 25 minutes.

## 2012-12-15 NOTE — Patient Instructions (Signed)
Current Therapy:  Maintenance chemotherapy with Alimta 500 mg per meter squared given every 3 weeks. The patient is status post 91 cycles.  CHEMOTHERAPY INTENT: Palliative/maintenance  CURRENT # OF CHEMOTHERAPY CYCLES: 92  CURRENT ANTIEMETICS: Zofran, dexamethasone and Compazine  CURRENT SMOKING STATUS: Quit smoking 10/07/2005  ORAL CHEMOTHERAPY AND CONSENT: None  CURRENT BISPHOSPHONATES USE: None  PAIN MANAGEMENT: 0/10 currently on morphine  NARCOTICS INDUCED CONSTIPATION: No constipation  LIVING WILL AND CODE STATUS: No CODE BLUE

## 2012-12-15 NOTE — Telephone Encounter (Signed)
added dr visit to 12/30 MW moved chemo AVS and CAL printed and taken to pt in Tx room shh

## 2012-12-15 NOTE — Telephone Encounter (Signed)
I have adjusted 12/30 appt

## 2012-12-15 NOTE — Patient Instructions (Signed)
[facility] Discharge Instructions for Patients Receiving Chemotherapy  Today you received the following chemotherapy agents Alimta  To help prevent nausea and vomiting after your treatment, we encourage you to take your nausea medication as prescribed.   If you develop nausea and vomiting that is not controlled by your nausea medication, call the clinic.   BELOW ARE SYMPTOMS THAT SHOULD BE REPORTED IMMEDIATELY:  *FEVER GREATER THAN 100.5 F  *CHILLS WITH OR WITHOUT FEVER  NAUSEA AND VOMITING THAT IS NOT CONTROLLED WITH YOUR NAUSEA MEDICATION  *UNUSUAL SHORTNESS OF BREATH  *UNUSUAL BRUISING OR BLEEDING  TENDERNESS IN MOUTH AND THROAT WITH OR WITHOUT PRESENCE OF ULCERS  *URINARY PROBLEMS  *BOWEL PROBLEMS  UNUSUAL RASH Items with * indicate a potential emergency and should be followed up as soon as possible.  Feel free to call the clinic you have any questions or concerns. The clinic phone number is [phone].    

## 2012-12-23 ENCOUNTER — Other Ambulatory Visit: Payer: Self-pay | Admitting: *Deleted

## 2012-12-23 MED ORDER — MORPHINE SULFATE ER 60 MG PO TBCR: 60.0000 mg | EXTENDED_RELEASE_TABLET | Freq: Two times a day (BID) | ORAL | Status: DC

## 2012-12-23 MED ORDER — MORPHINE SULFATE 30 MG PO TABS: 30.0000 mg | ORAL_TABLET | ORAL | Status: DC | PRN

## 2012-12-23 MED ORDER — ALPRAZOLAM 0.25 MG PO TABS: 0.2500 mg | ORAL_TABLET | Freq: Every evening | ORAL | Status: DC | PRN

## 2013-01-05 ENCOUNTER — Ambulatory Visit (HOSPITAL_BASED_OUTPATIENT_CLINIC_OR_DEPARTMENT_OTHER): Payer: Medicare Other

## 2013-01-05 ENCOUNTER — Telehealth: Payer: Self-pay | Admitting: *Deleted

## 2013-01-05 ENCOUNTER — Encounter: Payer: Self-pay | Admitting: Physician Assistant

## 2013-01-05 ENCOUNTER — Ambulatory Visit (HOSPITAL_BASED_OUTPATIENT_CLINIC_OR_DEPARTMENT_OTHER): Payer: Medicare Other | Admitting: Physician Assistant

## 2013-01-05 ENCOUNTER — Other Ambulatory Visit (HOSPITAL_BASED_OUTPATIENT_CLINIC_OR_DEPARTMENT_OTHER): Payer: Medicare Other

## 2013-01-05 DIAGNOSIS — C7951 Secondary malignant neoplasm of bone: Secondary | ICD-10-CM

## 2013-01-05 DIAGNOSIS — Z5111 Encounter for antineoplastic chemotherapy: Secondary | ICD-10-CM | POA: Diagnosis not present

## 2013-01-05 DIAGNOSIS — C3410 Malignant neoplasm of upper lobe, unspecified bronchus or lung: Secondary | ICD-10-CM

## 2013-01-05 DIAGNOSIS — C7931 Secondary malignant neoplasm of brain: Secondary | ICD-10-CM

## 2013-01-05 DIAGNOSIS — C7A090 Malignant carcinoid tumor of the bronchus and lung: Secondary | ICD-10-CM

## 2013-01-05 LAB — CBC WITH DIFFERENTIAL/PLATELET
BASO%: 0.7 % (ref 0.0–2.0)
EOS%: 0.4 % (ref 0.0–7.0)
HCT: 37.6 % (ref 34.8–46.6)
MCH: 29.2 pg (ref 25.1–34.0)
MCHC: 33.2 g/dL (ref 31.5–36.0)
MCV: 88.1 fL (ref 79.5–101.0)
MONO#: 0.9 10*3/uL (ref 0.1–0.9)
MONO%: 13.2 % (ref 0.0–14.0)
NEUT#: 3.8 10*3/uL (ref 1.5–6.5)
RBC: 4.27 10*6/uL (ref 3.70–5.45)
RDW: 15.6 % — ABNORMAL HIGH (ref 11.2–14.5)

## 2013-01-05 LAB — COMPREHENSIVE METABOLIC PANEL (CC13)
AST: 14 U/L (ref 5–34)
Albumin: 3.3 g/dL — ABNORMAL LOW (ref 3.5–5.0)
Alkaline Phosphatase: 92 U/L (ref 40–150)
BUN: 9.6 mg/dL (ref 7.0–26.0)
CO2: 24 mEq/L (ref 22–29)
Calcium: 10.1 mg/dL (ref 8.4–10.4)
Creatinine: 0.8 mg/dL (ref 0.6–1.1)
Potassium: 4.7 mEq/L (ref 3.5–5.1)
Sodium: 142 mEq/L (ref 136–145)
Total Bilirubin: 0.25 mg/dL (ref 0.20–1.20)
Total Protein: 7.5 g/dL (ref 6.4–8.3)

## 2013-01-05 MED ORDER — CYANOCOBALAMIN 1000 MCG/ML IJ SOLN
INTRAMUSCULAR | Status: AC
  Filled 2013-01-05: qty 1

## 2013-01-05 MED ORDER — DEXAMETHASONE SODIUM PHOSPHATE 10 MG/ML IJ SOLN
10.0000 mg | Freq: Once | INTRAMUSCULAR | Status: AC
  Administered 2013-01-05: 10 mg via INTRAVENOUS

## 2013-01-05 MED ORDER — HEPARIN SOD (PORK) LOCK FLUSH 100 UNIT/ML IV SOLN
500.0000 [IU] | Freq: Once | INTRAVENOUS | Status: AC | PRN
  Administered 2013-01-05: 500 [IU]
  Filled 2013-01-05: qty 5

## 2013-01-05 MED ORDER — ONDANSETRON 8 MG/50ML IVPB (CHCC)
8.0000 mg | Freq: Once | INTRAVENOUS | Status: AC
  Administered 2013-01-05: 8 mg via INTRAVENOUS

## 2013-01-05 MED ORDER — ONDANSETRON 8 MG/NS 50 ML IVPB
INTRAVENOUS | Status: AC
  Filled 2013-01-05: qty 8

## 2013-01-05 MED ORDER — CYANOCOBALAMIN 1000 MCG/ML IJ SOLN
1000.0000 ug | Freq: Once | INTRAMUSCULAR | Status: AC
  Administered 2013-01-05: 1000 ug via INTRAMUSCULAR

## 2013-01-05 MED ORDER — SODIUM CHLORIDE 0.9 % IV SOLN
500.0000 mg/m2 | Freq: Once | INTRAVENOUS | Status: AC
  Administered 2013-01-05: 900 mg via INTRAVENOUS
  Filled 2013-01-05: qty 36

## 2013-01-05 MED ORDER — ONDANSETRON HCL 8 MG PO TABS: ORAL_TABLET | ORAL | Status: DC

## 2013-01-05 MED ORDER — SODIUM CHLORIDE 0.9 % IV SOLN
Freq: Once | INTRAVENOUS | Status: AC
  Administered 2013-01-05: 10:00:00 via INTRAVENOUS

## 2013-01-05 MED ORDER — SODIUM CHLORIDE 0.9 % IJ SOLN
10.0000 mL | INTRAMUSCULAR | Status: DC | PRN
  Administered 2013-01-05: 10 mL
  Filled 2013-01-05: qty 10

## 2013-01-05 MED ORDER — DEXAMETHASONE SODIUM PHOSPHATE 10 MG/ML IJ SOLN
INTRAMUSCULAR | Status: AC
  Filled 2013-01-05: qty 1

## 2013-01-05 NOTE — Progress Notes (Addendum)
[facility] Telephone:([phone]) [phone]   [phone]  SHARED VISIT PROGRESS NOTE  [name], MD [address] [place] [address]  Diagnosis:  Metastatic non-small cell lung cancer initially diagnosed with locally advanced stage IIIB with right Pancoast tumor involving the vertebral body as well as the foraminal canal invasion with spinal cord compression in October of 2007.   Prior Therapy:  1. Status post concurrent chemoradiation with weekly carboplatin and paclitaxel, last dose was given November 18, 2005. 2. 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 3. Status post gamma knife radiotherapy to a solitary brain lesion located in the superior frontal area of the brain at [facility] in April of 2008. 4. Status post palliative radiotherapy to the lateral abdominal wall metastatic lesion under the care of Dr. Mitzi Hansen, completed March of 2009. 5. Status post 6 cycles of systemic chemotherapy with carboplatin and Alimta. Last dose was given July 26, 2007 with disease stabilization. 6. Gamma knife stereotactic radiotherapy to 2 brain lesions one involving the right frontal dural based as well as right parietal lesion performed on 05/07/2012 under the care of Dr. Atlee Abide at [facility].  Current Therapy:  Maintenance chemotherapy with Alimta 500 mg per meter squared given every 3 weeks. The patient is status post 92 cycles.   CHEMOTHERAPY INTENT: Palliative/maintenance  CURRENT # OF CHEMOTHERAPY CYCLES: 93 CURRENT ANTIEMETICS: Zofran, dexamethasone and Compazine  CURRENT SMOKING STATUS: Quit smoking 10/07/2005  ORAL CHEMOTHERAPY AND CONSENT: None  CURRENT BISPHOSPHONATES USE: None  PAIN MANAGEMENT: 0/10 currently on morphine  NARCOTICS INDUCED CONSTIPATION: No constipation  LIVING WILL AND CODE STATUS: No CODE BLUE   INTERVAL  HISTORY: Danielle [name] 62 y.o. female returns to the clinic today for followup visit accompanied by her sister. The patient is feeling fine today with no specific complaints. She is tolerating her maintenance treatment with single agent Alimta fairly well with no significant adverse effects. She requests a refill for her Zofran. The patient denied having any chest pain, shortness of breath, cough or hemoptysis. She denied having any fever or chills, no nausea or vomiting. The patient denied having any weight loss or night sweats.   MEDICAL HISTORY: Past Medical History  Diagnosis Date  . Hypertension   . Anemia   . H/O: pneumonia   . History of tobacco abuse quit 10/08    on nicotine patch  . Fibromyalgia   . Hypokalemia   . DJD (degenerative joint disease), cervical   . Thrush 12/11/2010  . 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 had chemo and radiation for lung ca    ALLERGIES:  is allergic to contrast media; co-trimoxazole injection; iohexol; and sulfa drugs cross reactors.  MEDICATIONS:  Current Outpatient Prescriptions  Medication Sig Dispense Refill  . ALPRAZolam (XANAX) 0.25 MG tablet Take 1 tablet (0.25 mg total) by mouth at bedtime as needed for sleep.  30 tablet  0  . antipyrine-benzocaine (AURALGAN) otic solution Place 1 drop into both ears daily.      . fluconazole (DIFLUCAN) 100 MG tablet Take 2 tablets by mouth on day one, then take 1 tablet by mouth daily until completed  16 tablet  0  . fluticasone (FLONASE) 50 MCG/ACT nasal spray Place 1-2 sprays into the nose daily.  16 g  11  . folic acid (FOLVITE) 1  MG tablet TAKE 1 TABLET BY MOUTH DAILY.  90 tablet  3  . lidocaine-prilocaine (EMLA) cream Apply topically as needed.  30 g  1  . lisinopril (PRINIVIL,ZESTRIL) 5 MG tablet TAKE 1 TABLET BY MOUTH EVERY DAY  90 tablet  1  . meclizine (ANTIVERT) 12.5 MG tablet Take 1-2 tablets (12.5-25 mg total) by mouth 3 (three) times daily as needed.  30  tablet  1  . morphine (MS CONTIN) 60 MG 12 hr tablet Take 1 tablet (60 mg total) by mouth 2 (two) times daily.  60 tablet  0  . morphine (MSIR) 30 MG tablet Take 1 tablet (30 mg total) by mouth every 4 (four) hours as needed.  60 tablet  0  . olopatadine (PATANOL) 0.1 % ophthalmic solution Place 1 drop into both eyes 2 (two) times daily.  5 mL  11  . ondansetron (ZOFRAN) 8 MG tablet TAKE 1 TABLET (8 MG TOTAL) BY MOUTH EVERY 8 (EIGHT) HOURS AS NEEDED FOR NAUSEA.  30 tablet  2  . predniSONE (DELTASONE) 50 MG tablet 13 hrs, 7 hrs, and 1 hr before scheduled CT scans       No current facility-administered medications for this visit.    SURGICAL HISTORY:  Past Surgical History  Procedure Laterality Date  . Cervical laminectomy  1995  . Other surgical history  2008    Gamma knife surgery to remove brain met   . 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 Knee surgery  1990    Left x 2  . Portacath placement  -[name]    TIP IN CAVOATRIAL JUNCTION    REVIEW OF SYSTEMS:  Constitutional: negative Eyes: negative Ears, nose, mouth, throat, and face: negative Respiratory: negative Cardiovascular: negative Gastrointestinal: negative Genitourinary:negative Integument/breast: negative Hematologic/lymphatic: negative Musculoskeletal:negative Neurological: negative Behavioral/Psych: negative Endocrine: negative Allergic/Immunologic: negative   PHYSICAL EXAMINATION: General appearance: alert, cooperative and no distress Head: Normocephalic, without obvious abnormality, atraumatic Neck: no adenopathy, no JVD, supple, symmetrical, trachea midline and thyroid not enlarged, symmetric, no tenderness/mass/nodules Lymph nodes: Cervical, supraclavicular, and axillary nodes normal. Resp: clear to auscultation bilaterally Back: symmetric, no curvature. ROM normal. No CVA tenderness. Cardio: regular rate and rhythm, S1, S2 normal, no murmur, click, rub or gallop GI: soft,  non-tender; bowel sounds normal; no masses,  no organomegaly Extremities: extremities normal, atraumatic, no cyanosis or edema Neurologic: Alert and oriented X 3, normal strength and tone. Normal symmetric reflexes. Normal coordination and gait  ECOG PERFORMANCE STATUS: 0 - Asymptomatic  Blood pressure 155/89, pulse 112, temperature 97.3 F (36.3 C), temperature source Oral, resp. rate 18, height 5\' 4"  (1.626 m), weight 149 lb 8 oz (67.813 kg).  LABORATORY DATA: Lab Results  Component Value Date   WBC 6.5 01/05/2013   HGB 12.5 01/05/2013   HCT 37.6 01/05/2013   MCV 88.1 01/05/2013   PLT 416* 01/05/2013      Chemistry      Component Value Date/Time   NA 142 01/05/2013 0823   NA 135 08/20/2011 0953   K 4.7 01/05/2013 0823   K 4.4 08/20/2011 0953   CL 106 06/30/2012 1004   CL 105 08/20/2011 0953   CO2 24 01/05/2013 0823   CO2 28 08/20/2011 0953   BUN 9.6 01/05/2013 0823   BUN 13 08/20/2011 0953   CREATININE 0.8 01/05/2013 0823   CREATININE 0.80 08/20/2011 0953      Component Value Date/Time   CALCIUM 10.1 01/05/2013  1610   CALCIUM 9.4 08/20/2011 0953   ALKPHOS 92 01/05/2013 0823   ALKPHOS 73 08/20/2011 0953   AST 14 01/05/2013 0823   AST 16 08/20/2011 0953   ALT 12 01/05/2013 0823   ALT 9 08/20/2011 0953   BILITOT 0.25 01/05/2013 0823   BILITOT 0.5 08/20/2011 0953       RADIOGRAPHIC STUDIES: Ct Chest W Contrast  12/11/2012   CLINICAL DATA:  Restaging examination. Patient with lung cancer, bone, brain mets. Ongoing chemotherapy.  EXAM: CT CHEST, ABDOMEN, AND PELVIS WITH CONTRAST  TECHNIQUE: Multidetector CT imaging of the chest, abdomen and pelvis was performed following the standard protocol during bolus administration of intravenous contrast.  CONTRAST:  OMNIPAQUE IOHEXOL 300 MG/ML  SOLN  COMPARISON:  Prior CT 10/09/2012.  FINDINGS:   CT CHEST FINDINGS  No enlarged axillary, mediastinal or hilar lymphadenopathy. Normal heart size. No pericardial effusion. Coronary artery  calcifications. Additionally there are atherosclerotic calcifications and plaque involving the thoracic aorta as well as the origin of the right common carotid artery. Right anterior chest wall Port-A-Cath is present with tip terminating at the right superior cavoatrial junction. Esophagus is grossly unremarkable.  Central airways are patent. Extensive centrilobular emphysematous change. Grossly stable subpleural mass within the right upper lobe measuring 2.7 x 1.2 cm, previously 2.9 x 1.1 cm (image 9; series 2). Re- demonstrated sequelae of radiation therapy within the right upper lobe. No pleural effusion or pneumothorax.    CT ABDOMEN AND PELVIS FINDINGS  Liver is normal in size and contour without focal hepatic lesion identified. Portal and hepatic veins are patent. Gallbladder is unremarkable. No intrahepatic or extrahepatic biliary ductal dilatation. The spleen, pancreas and bilateral adrenal glands are unremarkable. Kidneys enhance symmetrically with contrast. Unchanged sub cm low-attenuation lesion inferior pole left kidney. There is circumferential enhancement of the urothelium from the level of the bilateral renal collecting systems to the level of the bladder. Additionally there is circumferential bladder wall thickening and hyper enhancement of the urinary bladder mucosa.  Scattered calcified atherosclerotic plaque involving otherwise normal caliber abdominal aorta. No retroperitoneal lymphadenopathy. The uterus and adnexa are unremarkable. Descending and sigmoid colonic diverticulosis. No evidence for acute diverticulitis. Unchanged sigmoid colonic wall thickening. Unchanged 5 mm low-density nodule adjacent to the descending colon (image 90; series 2). No free fluid or free intraperitoneal air.  Lower lumbar spine degenerative change. The bones are relative osteopenic. Re- demonstrated kyphoplasty material within the T4 and T7 vertebral bodies. Persistent vertebral plana of the T3 vertebral body.     IMPRESSION: 1. No significant interval change in subpleural nodular thickening within the right upper lobe. No new pulmonary nodules. No lymphadenopathy. 2. No evidence for metastatic disease within the abdomen or pelvis. 3. Enhancement of the urothelium from the level of the renal collecting systems to the bladder which also demonstrates enhancement and mild wall thickening. Findings are nonspecific and may be secondary to cystitis or potentially treatment related. Recommend clinical correlation and correlation with urinalysis.   Electronically Signed   By: Annia Belt M.D.   On: 12/11/2012 08:47   ASSESSMENT AND PLAN: This is a very pleasant 62 years old white female with metastatic non-small cell lung cancer currently undergoing maintenance chemotherapy with single agent Alimta status post 92 cycles. The patient is tolerating her treatment fairly well with no significant adverse effects.  Her recent scan showed no evidence for disease progression. Patient was discussed with also seen by Dr. Arbutus Ped. She will proceed with cycle #93 today. She  will return for a followup visit in 3 weeks with the next cycle of her chemotherapy. She was advised to call immediately if she has any concerning symptoms in the interval. The patient voices understanding of current disease status and treatment options and is in agreement with the current care plan. A refill prescription for her Zofran was sent her pharmacy of record via E. scribed.  All questions were answered. The patient knows to call the clinic with any problems, questions or concerns. We can certainly see the patient much sooner if necessary.  Conni Slipper PA-C   ADDENDUM: Hematology/Oncology Attending: I had a face to face encounter with the patient. I recommended her care plan. This is a very pleasant 62 years old white female with metastatic non-small cell lung cancer, adenocarcinoma. The patient is currently on maintenance chemotherapy with  single agent Alimta status post 92 cycles. She is tolerating her treatment fairly well with no significant adverse effects. We will proceed with cycle #93 today as scheduled. She would come back for follow up visit in 3 weeks with the next cycle of her treatment. She was advised to call immediately if she has any concerning symptoms in the interval. Lajuana Matte., MD 01/05/2013

## 2013-01-05 NOTE — Patient Instructions (Signed)
Follow up in 3 weeks, prior to your next cycle of maintenance chemotherapy 

## 2013-01-05 NOTE — Progress Notes (Signed)
01/05/13 @ 8:30 am, BMS [id number], Visit with PROs:  Mrs. Mccalister into the [facility] to have labs and see Tiana Loft, PA, and receive Alimta.  She completed the LCSS and [id number] PROs prior to her appointments.  Confirmed all questions were answered.  Thanked her for her continued support of the study.

## 2013-01-05 NOTE — Telephone Encounter (Signed)
appts made and printed. Pt is aware that tx will be added. i emailed MW to add the tx...td 

## 2013-01-05 NOTE — Telephone Encounter (Signed)
Per staff message and POF I have scheduled appts.  JMW  

## 2013-01-11 DIAGNOSIS — C7931 Secondary malignant neoplasm of brain: Secondary | ICD-10-CM | POA: Diagnosis not present

## 2013-01-11 DIAGNOSIS — C801 Malignant (primary) neoplasm, unspecified: Secondary | ICD-10-CM | POA: Diagnosis not present

## 2013-01-11 DIAGNOSIS — C7949 Secondary malignant neoplasm of other parts of nervous system: Secondary | ICD-10-CM | POA: Diagnosis not present

## 2013-01-26 ENCOUNTER — Telehealth: Payer: Self-pay | Admitting: *Deleted

## 2013-01-26 ENCOUNTER — Encounter: Payer: Self-pay | Admitting: *Deleted

## 2013-01-26 ENCOUNTER — Ambulatory Visit (HOSPITAL_BASED_OUTPATIENT_CLINIC_OR_DEPARTMENT_OTHER): Payer: Medicare Other | Admitting: Internal Medicine

## 2013-01-26 ENCOUNTER — Encounter: Payer: Self-pay | Admitting: Internal Medicine

## 2013-01-26 ENCOUNTER — Telehealth: Payer: Self-pay | Admitting: Internal Medicine

## 2013-01-26 ENCOUNTER — Ambulatory Visit (HOSPITAL_BASED_OUTPATIENT_CLINIC_OR_DEPARTMENT_OTHER): Payer: Medicare Other

## 2013-01-26 ENCOUNTER — Other Ambulatory Visit (HOSPITAL_BASED_OUTPATIENT_CLINIC_OR_DEPARTMENT_OTHER): Payer: Medicare Other

## 2013-01-26 VITALS — BP 159/70 | HR 82 | Temp 98.2°F | Resp 18 | Ht 64.0 in | Wt 154.7 lb

## 2013-01-26 DIAGNOSIS — Z5111 Encounter for antineoplastic chemotherapy: Secondary | ICD-10-CM | POA: Diagnosis not present

## 2013-01-26 DIAGNOSIS — C7951 Secondary malignant neoplasm of bone: Secondary | ICD-10-CM

## 2013-01-26 DIAGNOSIS — C7952 Secondary malignant neoplasm of bone marrow: Secondary | ICD-10-CM

## 2013-01-26 DIAGNOSIS — C7931 Secondary malignant neoplasm of brain: Secondary | ICD-10-CM

## 2013-01-26 DIAGNOSIS — C3490 Malignant neoplasm of unspecified part of unspecified bronchus or lung: Secondary | ICD-10-CM

## 2013-01-26 DIAGNOSIS — C3410 Malignant neoplasm of upper lobe, unspecified bronchus or lung: Secondary | ICD-10-CM

## 2013-01-26 DIAGNOSIS — C7949 Secondary malignant neoplasm of other parts of nervous system: Secondary | ICD-10-CM

## 2013-01-26 DIAGNOSIS — C7A090 Malignant carcinoid tumor of the bronchus and lung: Secondary | ICD-10-CM

## 2013-01-26 LAB — CBC WITH DIFFERENTIAL/PLATELET
BASO%: 0.7 % (ref 0.0–2.0)
Basophils Absolute: 0.1 10*3/uL (ref 0.0–0.1)
EOS%: 0.6 % (ref 0.0–7.0)
Eosinophils Absolute: 0 10*3/uL (ref 0.0–0.5)
HEMATOCRIT: 37.3 % (ref 34.8–46.6)
HGB: 12.4 g/dL (ref 11.6–15.9)
LYMPH%: 21.9 % (ref 14.0–49.7)
MCH: 29.4 pg (ref 25.1–34.0)
MCHC: 33.1 g/dL (ref 31.5–36.0)
MCV: 88.7 fL (ref 79.5–101.0)
MONO#: 1 10*3/uL — AB (ref 0.1–0.9)
MONO%: 13.3 % (ref 0.0–14.0)
NEUT#: 4.7 10*3/uL (ref 1.5–6.5)
NEUT%: 63.5 % (ref 38.4–76.8)
Platelets: 383 10*3/uL (ref 145–400)
RBC: 4.21 10*6/uL (ref 3.70–5.45)
RDW: 16.4 % — ABNORMAL HIGH (ref 11.2–14.5)
WBC: 7.3 10*3/uL (ref 3.9–10.3)
lymph#: 1.6 10*3/uL (ref 0.9–3.3)

## 2013-01-26 LAB — COMPREHENSIVE METABOLIC PANEL (CC13)
ALK PHOS: 87 U/L (ref 40–150)
ALT: 12 U/L (ref 0–55)
AST: 16 U/L (ref 5–34)
Albumin: 3.3 g/dL — ABNORMAL LOW (ref 3.5–5.0)
Anion Gap: 9 mEq/L (ref 3–11)
BUN: 8.6 mg/dL (ref 7.0–26.0)
CALCIUM: 9.7 mg/dL (ref 8.4–10.4)
CHLORIDE: 105 meq/L (ref 98–109)
CO2: 27 mEq/L (ref 22–29)
CREATININE: 0.7 mg/dL (ref 0.6–1.1)
Glucose: 110 mg/dl (ref 70–140)
Potassium: 4.6 mEq/L (ref 3.5–5.1)
Sodium: 142 mEq/L (ref 136–145)
Total Bilirubin: 0.33 mg/dL (ref 0.20–1.20)
Total Protein: 7.2 g/dL (ref 6.4–8.3)

## 2013-01-26 LAB — RESEARCH LABS

## 2013-01-26 MED ORDER — DEXAMETHASONE SODIUM PHOSPHATE 10 MG/ML IJ SOLN
10.0000 mg | Freq: Once | INTRAMUSCULAR | Status: AC
  Administered 2013-01-26: 10 mg via INTRAVENOUS

## 2013-01-26 MED ORDER — SODIUM CHLORIDE 0.9 % IJ SOLN
10.0000 mL | INTRAMUSCULAR | Status: DC | PRN
  Administered 2013-01-26: 10 mL
  Filled 2013-01-26: qty 10

## 2013-01-26 MED ORDER — ONDANSETRON 8 MG/NS 50 ML IVPB
INTRAVENOUS | Status: AC
  Filled 2013-01-26: qty 8

## 2013-01-26 MED ORDER — MORPHINE SULFATE ER 60 MG PO TBCR: 60.0000 mg | EXTENDED_RELEASE_TABLET | Freq: Two times a day (BID) | ORAL | Status: DC

## 2013-01-26 MED ORDER — HEPARIN SOD (PORK) LOCK FLUSH 100 UNIT/ML IV SOLN
500.0000 [IU] | Freq: Once | INTRAVENOUS | Status: AC | PRN
  Administered 2013-01-26: 500 [IU]
  Filled 2013-01-26: qty 5

## 2013-01-26 MED ORDER — DEXAMETHASONE SODIUM PHOSPHATE 10 MG/ML IJ SOLN
INTRAMUSCULAR | Status: AC
  Filled 2013-01-26: qty 1

## 2013-01-26 MED ORDER — ALPRAZOLAM 0.25 MG PO TABS: 0.2500 mg | ORAL_TABLET | Freq: Every evening | ORAL | Status: DC | PRN

## 2013-01-26 MED ORDER — SODIUM CHLORIDE 0.9 % IV SOLN
500.0000 mg/m2 | Freq: Once | INTRAVENOUS | Status: AC
  Administered 2013-01-26: 900 mg via INTRAVENOUS
  Filled 2013-01-26: qty 36

## 2013-01-26 MED ORDER — SODIUM CHLORIDE 0.9 % IV SOLN
Freq: Once | INTRAVENOUS | Status: AC
  Administered 2013-01-26: 10:00:00 via INTRAVENOUS

## 2013-01-26 MED ORDER — MORPHINE SULFATE 30 MG PO TABS: 30.0000 mg | ORAL_TABLET | ORAL | Status: DC | PRN

## 2013-01-26 MED ORDER — ONDANSETRON 8 MG/50ML IVPB (CHCC)
8.0000 mg | Freq: Once | INTRAVENOUS | Status: AC
  Administered 2013-01-26: 8 mg via INTRAVENOUS

## 2013-01-26 NOTE — Patient Instructions (Signed)
[place] Discharge Instructions for Patients Receiving Chemotherapy  Today you received the following chemotherapy agents alimta.  To help prevent nausea and vomiting after your treatment, we encourage you to take your nausea medication zofran.   If you develop nausea and vomiting that is not controlled by your nausea medication, call the clinic.   BELOW ARE SYMPTOMS THAT SHOULD BE REPORTED IMMEDIATELY:  *FEVER GREATER THAN 100.5 F  *CHILLS WITH OR WITHOUT FEVER  NAUSEA AND VOMITING THAT IS NOT CONTROLLED WITH YOUR NAUSEA MEDICATION  *UNUSUAL SHORTNESS OF BREATH  *UNUSUAL BRUISING OR BLEEDING  TENDERNESS IN MOUTH AND THROAT WITH OR WITHOUT PRESENCE OF ULCERS  *URINARY PROBLEMS  *BOWEL PROBLEMS  UNUSUAL RASH Items with * indicate a potential emergency and should be followed up as soon as possible.  Feel free to call the clinic you have any questions or concerns. The clinic phone number is [phone].

## 2013-01-26 NOTE — Patient Instructions (Signed)
Followup visit in 3 weeks with repeat CT scan of the chest, abdomen and pelvis.

## 2013-01-26 NOTE — Telephone Encounter (Signed)
Gave pt appt for lab, md chemo for February and emailed [place] regarding chemo orders
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
## 2013-01-26 NOTE — Progress Notes (Signed)
[facility] [phone]   [phone]  OFFICE PROGRESS NOTE  [name], MD [address] [place]. [name] [address]  Diagnosis:  Metastatic non-small cell lung cancer initially diagnosed with locally advanced stage IIIB with right Pancoast tumor involving the vertebral body as well as the foraminal canal invasion with spinal cord compression in October of 2007.   Prior Therapy:  1. Status post concurrent chemoradiation with weekly carboplatin and paclitaxel, last dose was given November 18, 2005. 2. 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 3. Status post gamma knife radiotherapy to a solitary brain lesion located in the superior frontal area of the brain at [facility] in April of 2008. 4. Status post palliative radiotherapy to the lateral abdominal wall metastatic lesion under the care of Dr. Lisbeth Renshaw, completed March of 2009. 5. Status post 6 cycles of systemic chemotherapy with carboplatin and Alimta. Last dose was given July 26, 2007 with disease stabilization. 6. Gamma knife stereotactic radiotherapy to 2 brain lesions one involving the right frontal dural based as well as right parietal lesion performed on 05/07/2012 under the care of Dr. Lilyan Punt at [facility].  Current Therapy:  Maintenance chemotherapy with Alimta 500 mg per meter squared given every 3 weeks. The patient is status post 93 cycles.   CHEMOTHERAPY INTENT: Palliative/maintenance  CURRENT # OF CHEMOTHERAPY CYCLES: 94  CURRENT ANTIEMETICS: Zofran, dexamethasone and Compazine  CURRENT SMOKING STATUS: Quit smoking 10/07/2005  ORAL CHEMOTHERAPY AND CONSENT: None  CURRENT BISPHOSPHONATES USE: None  PAIN MANAGEMENT: 0/10 currently on morphine  NARCOTICS INDUCED CONSTIPATION: No constipation  LIVING WILL AND CODE STATUS: No CODE BLUE   INTERVAL  HISTORY: Danielle [name] 63 y.o. female returns to the clinic today for followup visit accompanied by her sister. The patient is feeling fine today with no specific complaints. She is tolerating her maintenance treatment with single agent Alimta fairly well with no significant adverse effects. She was seen recently by Dr. Vallarie Mare at [facility] and there was no evidence for disease recurrence in her brain. The patient denied having any chest pain, shortness of breath, cough or hemoptysis. She denied having any fever or chills, no nausea or vomiting. The patient denied having any weight loss or night sweats.   MEDICAL HISTORY: Past Medical History  Diagnosis Date  . Hypertension   . Anemia   . H/O: pneumonia   . History of tobacco abuse quit 10/08    on nicotine patch  . Fibromyalgia   . Hypokalemia   . DJD (degenerative joint disease), cervical   . Thrush 12/11/2010  . 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 had chemo and radiation for lung ca    ALLERGIES:  is allergic to contrast media; co-trimoxazole injection; iohexol; and sulfa drugs cross reactors.  MEDICATIONS:  Current Outpatient Prescriptions  Medication Sig Dispense Refill  . ALPRAZolam (XANAX) 0.25 MG tablet Take 1 tablet (0.25 mg total) by mouth at bedtime as needed for sleep.  30 tablet  0  . antipyrine-benzocaine (AURALGAN) otic solution Place 1 drop into both ears daily.      . fluticasone (FLONASE) 50 MCG/ACT nasal spray Place 1-2 sprays into the nose daily.  16 g  11  . folic acid (FOLVITE) 1 MG tablet TAKE 1 TABLET BY MOUTH DAILY.  90 tablet  3  .  lidocaine-prilocaine (EMLA) cream Apply topically as needed.  30 g  1  . lisinopril (PRINIVIL,ZESTRIL) 5 MG tablet TAKE 1 TABLET BY MOUTH EVERY DAY  90 tablet  1  . meclizine (ANTIVERT) 12.5 MG tablet Take 1-2 tablets (12.5-25 mg total) by mouth 3 (three) times daily as needed.  30 tablet  1  . morphine (MS CONTIN) 60 MG 12 hr tablet  Take 1 tablet (60 mg total) by mouth 2 (two) times daily.  60 tablet  0  . morphine (MSIR) 30 MG tablet Take 1 tablet (30 mg total) by mouth every 4 (four) hours as needed.  60 tablet  0  . olopatadine (PATANOL) 0.1 % ophthalmic solution Place 1 drop into both eyes 2 (two) times daily.  5 mL  11  . ondansetron (ZOFRAN) 8 MG tablet TAKE 1 TABLET (8 MG TOTAL) BY MOUTH EVERY 8 (EIGHT) HOURS AS NEEDED FOR NAUSEA.  30 tablet  2  . predniSONE (DELTASONE) 50 MG tablet 13 hrs, 7 hrs, and 1 hr before scheduled CT scans       No current facility-administered medications for this visit.    SURGICAL HISTORY:  Past Surgical History  Procedure Laterality Date  . Cervical laminectomy  1995  . Other surgical history  2008    Gamma knife surgery to remove brain met   . 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 Knee surgery  1990    Left x 2  . Portacath placement  -[name]    TIP IN CAVOATRIAL JUNCTION    REVIEW OF SYSTEMS:  Constitutional: negative Eyes: negative Ears, nose, mouth, throat, and face: negative Respiratory: negative Cardiovascular: negative Gastrointestinal: negative Genitourinary:negative Integument/breast: negative Hematologic/lymphatic: negative Musculoskeletal:negative Neurological: negative Behavioral/Psych: negative Endocrine: negative Allergic/Immunologic: negative   PHYSICAL EXAMINATION: General appearance: alert, cooperative and no distress Head: Normocephalic, without obvious abnormality, atraumatic Neck: no adenopathy, no JVD, supple, symmetrical, trachea midline and thyroid not enlarged, symmetric, no tenderness/mass/nodules Lymph nodes: Cervical, supraclavicular, and axillary nodes normal. Resp: clear to auscultation bilaterally Back: symmetric, no curvature. ROM normal. No CVA tenderness. Cardio: regular rate and rhythm, S1, S2 normal, no murmur, click, rub or gallop GI: soft, non-tender; bowel sounds normal; no masses,  no  organomegaly Extremities: extremities normal, atraumatic, no cyanosis or edema Neurologic: Alert and oriented X 3, normal strength and tone. Normal symmetric reflexes. Normal coordination and gait  ECOG PERFORMANCE STATUS: 0 - Asymptomatic  Blood pressure 159/70, pulse 82, temperature 98.2 F (36.8 C), temperature source Oral, resp. rate 18, height _0  (1.626 m), weight 154 lb 11.2 oz (70.171 kg), SpO2 99.00%.  LABORATORY DATA: Lab Results  Component Value Date   WBC 7.3 01/26/2013   HGB 12.4 01/26/2013   HCT 37.3 01/26/2013   MCV 88.7 01/26/2013   PLT 383 01/26/2013      Chemistry      Component Value Date/Time   NA 142 01/05/2013 0823   NA 135 08/20/2011 0953   K 4.7 01/05/2013 0823   K 4.4 08/20/2011 0953   CL 106 06/30/2012 1004   CL 105 08/20/2011 0953   CO2 24 01/05/2013 0823   CO2 28 08/20/2011 0953   BUN 9.6 01/05/2013 0823   BUN 13 08/20/2011 0953   CREATININE 0.8 01/05/2013 0823   CREATININE 0.80 08/20/2011 0953      Component Value Date/Time   CALCIUM 10.1 01/05/2013 0823   CALCIUM 9.4 08/20/2011 0953   ALKPHOS 92 01/05/2013 0823  ALKPHOS 73 08/20/2011 0953   AST 14 01/05/2013 0823   AST 16 08/20/2011 0953   ALT 12 01/05/2013 0823   ALT 9 08/20/2011 0953   BILITOT 0.25 01/05/2013 0823   BILITOT 0.5 08/20/2011 0953       RADIOGRAPHIC STUDIES:  ASSESSMENT AND PLAN: This is a very pleasant 63 years old white female with metastatic non-small cell lung cancer currently undergoing maintenance chemotherapy with single agent Alimta status post 93 cycles. The patient is tolerating her treatment fairly well with no significant adverse effects.  I recommended for her to continue on maintenance and Alimta as scheduled. The patient will receive cycle #94 today. She would come back for followup visit in 3 weeks with the next cycle of her chemotherapy after repeating CT scan of the chest, abdomen and pelvis for restaging of her disease. She was advised to call immediately if she  has any concerning symptoms in the interval. The patient voices understanding of current disease status and treatment options and is in agreement with the current care plan.  All questions were answered. The patient knows to call the clinic with any problems, questions or concerns. We can certainly see the patient much sooner if necessary.  Disclaimer: This note was dictated with voice recognition software. Similar sounding words can inadvertently be transcribed and may not be corrected upon review.

## 2013-01-26 NOTE — Progress Notes (Signed)
01/26/13 @ 8:45 am, BMS [id number], PROs and Research Labs:  [name] into the [facility] for labs and to see Dr. Julien Nordmann prior to treatment with Alimta.  She had study labs drawn with her [facility] labs and completed the LCSS and EQ-5L-3D PROs prior to her visit with the MD.  Verified all fields were complete in the PROs.

## 2013-01-26 NOTE — Telephone Encounter (Signed)
Per staff message and POF I have scheduled appts.  JMW  

## 2013-01-27 ENCOUNTER — Telehealth: Payer: Self-pay | Admitting: Internal Medicine

## 2013-01-27 NOTE — Telephone Encounter (Signed)
Gave pt appt for lab MD and chemo for February and MArch 2015

## 2013-02-05 ENCOUNTER — Telehealth: Payer: Self-pay | Admitting: Family Medicine

## 2013-02-05 ENCOUNTER — Other Ambulatory Visit (INDEPENDENT_AMBULATORY_CARE_PROVIDER_SITE_OTHER): Payer: Medicare Other

## 2013-02-05 DIAGNOSIS — E7800 Pure hypercholesterolemia, unspecified: Secondary | ICD-10-CM

## 2013-02-05 DIAGNOSIS — E119 Type 2 diabetes mellitus without complications: Secondary | ICD-10-CM

## 2013-02-05 LAB — COMPREHENSIVE METABOLIC PANEL
ALBUMIN: 3.4 g/dL — AB (ref 3.5–5.2)
ALK PHOS: 75 U/L (ref 39–117)
ALT: 9 U/L (ref 0–35)
AST: 17 U/L (ref 0–37)
BUN: 9 mg/dL (ref 6–23)
CHLORIDE: 105 meq/L (ref 96–112)
CO2: 27 mEq/L (ref 19–32)
Calcium: 9.2 mg/dL (ref 8.4–10.5)
Creatinine, Ser: 0.6 mg/dL (ref 0.4–1.2)
GFR: 104.17 mL/min (ref >60.00)
Glucose, Bld: 102 mg/dL — ABNORMAL HIGH (ref 70–99)
Potassium: 4.3 mEq/L (ref 3.5–5.1)
SODIUM: 138 meq/L (ref 135–145)
Total Bilirubin: 0.4 mg/dL (ref 0.3–1.2)
Total Protein: 7 g/dL (ref 6.0–8.3)

## 2013-02-05 LAB — LIPID PANEL
CHOL/HDL RATIO: 7
Cholesterol: 201 mg/dL — ABNORMAL HIGH (ref 0–200)
HDL: 29.3 mg/dL — ABNORMAL LOW (ref >39.00)
Triglycerides: 166 mg/dL — ABNORMAL HIGH (ref 0.0–149.0)
VLDL: 33.2 mg/dL (ref 0.0–40.0)

## 2013-02-05 LAB — HEMOGLOBIN A1C: HEMOGLOBIN A1C: 6.2 % (ref 4.6–6.5)

## 2013-02-05 LAB — LDL CHOLESTEROL, DIRECT: Direct LDL: 143.9 mg/dL

## 2013-02-05 NOTE — Telephone Encounter (Signed)
Message copied by [name] on Fri Feb 05, 2013  8:27 AM ------      Message from: Ellamae Sia      Created: Mon Jan 25, 2013  5:24 PM      Regarding: Lab orders for Friday, 1.30.15       Patient is scheduled for CPX labs, please order future labs, Thanks , Terri       ------

## 2013-02-08 ENCOUNTER — Telehealth: Payer: Self-pay

## 2013-02-08 NOTE — Telephone Encounter (Signed)
Relevant patient education assigned to patient using Emmi. ° °

## 2013-02-12 ENCOUNTER — Ambulatory Visit: Payer: Medicare Other | Admitting: Family Medicine

## 2013-02-12 ENCOUNTER — Ambulatory Visit (INDEPENDENT_AMBULATORY_CARE_PROVIDER_SITE_OTHER): Payer: Medicare Other | Admitting: Family Medicine

## 2013-02-12 ENCOUNTER — Ambulatory Visit (HOSPITAL_COMMUNITY)
Admission: RE | Admit: 2013-02-12 | Discharge: 2013-02-12 | Disposition: A | Discharge status: Home / Self Care | Payer: Medicare Other | Source: Ambulatory Visit | Attending: Internal Medicine | Admitting: Internal Medicine

## 2013-02-12 ENCOUNTER — Encounter (HOSPITAL_COMMUNITY): Payer: Self-pay

## 2013-02-12 ENCOUNTER — Encounter: Payer: Self-pay | Admitting: Family Medicine

## 2013-02-12 VITALS — BP 156/86 | HR 82 | Temp 97.7°F | Ht 63.5 in | Wt 158.0 lb

## 2013-02-12 DIAGNOSIS — I10 Essential (primary) hypertension: Secondary | ICD-10-CM | POA: Diagnosis not present

## 2013-02-12 DIAGNOSIS — Z0000 Encounter for general adult medical examination without abnormal findings: Secondary | ICD-10-CM

## 2013-02-12 DIAGNOSIS — E119 Type 2 diabetes mellitus without complications: Secondary | ICD-10-CM

## 2013-02-12 DIAGNOSIS — S22009A Unspecified fracture of unspecified thoracic vertebra, initial encounter for closed fracture: Secondary | ICD-10-CM | POA: Insufficient documentation

## 2013-02-12 DIAGNOSIS — M4000 Postural kyphosis, site unspecified: Secondary | ICD-10-CM | POA: Insufficient documentation

## 2013-02-12 DIAGNOSIS — Z91041 Radiographic dye allergy status: Secondary | ICD-10-CM | POA: Insufficient documentation

## 2013-02-12 DIAGNOSIS — J438 Other emphysema: Secondary | ICD-10-CM | POA: Insufficient documentation

## 2013-02-12 DIAGNOSIS — C7931 Secondary malignant neoplasm of brain: Secondary | ICD-10-CM | POA: Diagnosis not present

## 2013-02-12 DIAGNOSIS — C7949 Secondary malignant neoplasm of other parts of nervous system: Secondary | ICD-10-CM

## 2013-02-12 DIAGNOSIS — C3490 Malignant neoplasm of unspecified part of unspecified bronchus or lung: Secondary | ICD-10-CM | POA: Insufficient documentation

## 2013-02-12 DIAGNOSIS — R911 Solitary pulmonary nodule: Secondary | ICD-10-CM | POA: Insufficient documentation

## 2013-02-12 DIAGNOSIS — E7800 Pure hypercholesterolemia, unspecified: Secondary | ICD-10-CM | POA: Diagnosis not present

## 2013-02-12 DIAGNOSIS — X58XXXA Exposure to other specified factors, initial encounter: Secondary | ICD-10-CM | POA: Insufficient documentation

## 2013-02-12 LAB — HM DIABETES FOOT EXAM

## 2013-02-12 MED ORDER — IOHEXOL 300 MG/ML  SOLN
80.0000 mL | Freq: Once | INTRAMUSCULAR | Status: AC | PRN
  Administered 2013-02-12: 80 mL via INTRAVENOUS

## 2013-02-12 MED ORDER — LISINOPRIL 10 MG PO TABS: ORAL_TABLET | ORAL | Status: DC

## 2013-02-12 MED ORDER — ANTIPYRINE-BENZOCAINE 5.4-1.4 % OT SOLN: 1.0000 [drp] | Freq: Every day | OTIC | Status: DC

## 2013-02-12 NOTE — Assessment & Plan Note (Signed)
Not at goal off zocor... She will work on increasing exercise and decreasing chol in diet. Info given. If not at [place] next OV we will restart zocor. She is agreeable and wants to treat this despite met lung cancer dx.

## 2013-02-12 NOTE — Assessment & Plan Note (Signed)
Poor control off amlodipine. Increase lisinopril to 10 mg daily, She has weekly creatinine at chemo.

## 2013-02-12 NOTE — Progress Notes (Signed)
HPI  I have personally reviewed the Medicare Annual Wellness questionnaire and have noted  1. The patient's medical and social history  2. Their use of alcohol, tobacco or illicit drugs  3. Their current medications and supplements  4. The patient's functional ability including ADL's, fall risks, home safety risks and hearing or visual  impairment.  5. Diet and physical activities  6. Evidence for depression or mood disorders  The patients weight, height, BMI and visual acuity have been recorded in the chart  I have made referrals, counseling and provided education to the patient based review of the above and I have provided the pt with a written personalized care plan for preventive services.   Lung cancer, metastatic On 95 chemo cycle!!!.. MRI brain.stable 6 months ago no tumor recurrence at this time. Sees ONC regularly. Chemo recieves every 3 weeks.  CT scan of lungs and abdomen/pelvis stable at last check 3 weeks ago.  Pain well controlled with morphine..some constipation..hard stool.  Sleeping well. Using alprazolam.   Diabetes: Well controlled with diet  Lab Results  Component Value Date   HGBA1C 6.2 02/05/2013   Using medications without difficulties:  Hypoglycemic episodes: None  Hyperglycemic episodes:None  Feet problems: No ulcers  Blood Sugars averaging: not checking at home.  eye exam within last year: 01/2011   Hypertension: Worsened control off amlodipine, continued lisinopril  BP Readings from Last 3 Encounters:  02/12/13 156/86  01/26/13 159/70  01/05/13 155/89  Using medication without problems or lightheadedness: None  Chest pain with exertion:None  Edema:Occ  Short of breath: Yes, but stable  Average home BPs:  Elevated Other issues:   Elevated Cholesterol: Off zocor, not at goal. 100. But improving.  Limited on exercise. Lab Results  Component Value Date   CHOL 201* 02/05/2013   HDL 29.30* 02/05/2013   LDLCALC 71 01/28/2012   LDLDIRECT 143.9 02/05/2013    TRIG 166.0* 02/05/2013   CHOLHDL 7 02/05/2013   Using medications without problems:Yes  Muscle aches: None  Other complaints:   Review of Systems  Constitutional: Negative for fever and fatigue.  HENT: Positive for ear pain.  Eyes: Negative for pain.  Respiratory: Negative for chest tightness and shortness of breath.  Cardiovascular: Negative for chest pain, palpitations and leg swelling.  Gastrointestinal: Negative for abdominal pain.  Genitourinary: Negative for dysuria.  Objective:   Physical Exam  Constitutional: Vital signs are normal. She appears well-developed and well-nourished. She is cooperative. Non-toxic appearance. She does not appear ill. No distress.  HENT:  Head: Normocephalic.  Right Ear: Hearing, external ear and ear canal normal. Tympanic membrane is not erythematous, not retracted and not bulging.Left Ear: Hearing, external ear and ear canal normal. Tympanic membrane is not erythematous, not retracted and not bulging. Nose: No mucosal edema or rhinorrhea. Right sinus exhibits no maxillary sinus tenderness and no frontal sinus tenderness. Left sinus exhibits no maxillary sinus tenderness and no frontal sinus tenderness.  Mouth/Throat: Uvula is midline, oropharynx is clear and moist and mucous membranes are normal.  Eyes: Conjunctivae normal, EOM and lids are normal. Pupils are equal, round, and reactive to light. No foreign bodies found.  Neck: Trachea normal and normal range of motion. Neck supple. Carotid bruit is not present. No mass and no thyromegaly present.  Cardiovascular: Normal rate, regular rhythm, S1 normal, S2 normal, normal heart sounds, intact distal pulses and normal pulses. Exam reveals no gallop and no friction rub.  No murmur heard.  Pulmonary/Chest: Effort normal and breath sounds normal.  Not tachypneic. No respiratory distress. She has no decreased breath sounds. She has no wheezes. She has no rhonchi. She has no rales.  Abdominal: Soft. Normal  appearance and bowel sounds are normal. There is no tenderness.  Neurological: She is alert.  Skin: Skin is warm, dry and intact. No rash noted.  Psychiatric: Her speech is normal and behavior is normal. Judgment and thought content normal. Her mood appears not anxious. Cognition and memory are normal. She does not exhibit a depressed mood.   Diabetic foot exam: Normal inspection No skin breakdown No calluses  Normal DP pulses Normal sensation to light touch and monofilament Nails normal  Assessment & Plan:   The patient's preventative maintenance and recommended screening tests for an annual wellness exam were reviewed in full today.  Brought up to date unless services declined.  Counselled on the importance of diet, exercise, and its role in overall health and mortality.  The patient's FH and SH was reviewed, including their home life, tobacco status, and drug and alcohol status.   Vaccines: Uptodate with vaccines.  Not interested in prevention given lung cancer diagnosis.

## 2013-02-12 NOTE — Progress Notes (Signed)
Pre-visit discussion using our clinic review tool. No additional management support is needed unless otherwise documented below in the visit note.  

## 2013-02-12 NOTE — Assessment & Plan Note (Signed)
Well controlled on no med. 

## 2013-02-12 NOTE — Patient Instructions (Addendum)
Increase lisinopril to 10 mg daily. Follow BP at home/ MD.. Goal BP is < 140/90. Try to get  Cholesterol down with increased activity and decreased fat, chol in diet. Follow up HTN and chol, with labs prior in 3 months. Make sure to have yearly eye exam.

## 2013-02-16 ENCOUNTER — Ambulatory Visit (HOSPITAL_BASED_OUTPATIENT_CLINIC_OR_DEPARTMENT_OTHER): Payer: Medicare Other

## 2013-02-16 ENCOUNTER — Encounter: Payer: Self-pay | Admitting: *Deleted

## 2013-02-16 ENCOUNTER — Ambulatory Visit (HOSPITAL_BASED_OUTPATIENT_CLINIC_OR_DEPARTMENT_OTHER): Payer: Medicare Other | Admitting: Internal Medicine

## 2013-02-16 ENCOUNTER — Encounter: Payer: Self-pay | Admitting: Internal Medicine

## 2013-02-16 ENCOUNTER — Telehealth: Payer: Self-pay | Admitting: Internal Medicine

## 2013-02-16 ENCOUNTER — Other Ambulatory Visit (HOSPITAL_BASED_OUTPATIENT_CLINIC_OR_DEPARTMENT_OTHER): Payer: Medicare Other

## 2013-02-16 VITALS — BP 168/83 | HR 70 | Temp 98.3°F | Resp 18 | Ht 63.0 in | Wt 159.5 lb

## 2013-02-16 DIAGNOSIS — C3410 Malignant neoplasm of upper lobe, unspecified bronchus or lung: Secondary | ICD-10-CM

## 2013-02-16 DIAGNOSIS — C7952 Secondary malignant neoplasm of bone marrow: Secondary | ICD-10-CM

## 2013-02-16 DIAGNOSIS — C3490 Malignant neoplasm of unspecified part of unspecified bronchus or lung: Secondary | ICD-10-CM

## 2013-02-16 DIAGNOSIS — C7951 Secondary malignant neoplasm of bone: Secondary | ICD-10-CM

## 2013-02-16 DIAGNOSIS — Z5111 Encounter for antineoplastic chemotherapy: Secondary | ICD-10-CM

## 2013-02-16 DIAGNOSIS — C7931 Secondary malignant neoplasm of brain: Secondary | ICD-10-CM

## 2013-02-16 DIAGNOSIS — C779 Secondary and unspecified malignant neoplasm of lymph node, unspecified: Secondary | ICD-10-CM

## 2013-02-16 DIAGNOSIS — C7949 Secondary malignant neoplasm of other parts of nervous system: Secondary | ICD-10-CM | POA: Diagnosis not present

## 2013-02-16 DIAGNOSIS — C50919 Malignant neoplasm of unspecified site of unspecified female breast: Secondary | ICD-10-CM

## 2013-02-16 LAB — COMPREHENSIVE METABOLIC PANEL (CC13)
ALBUMIN: 3.4 g/dL — AB (ref 3.5–5.0)
ALT: 10 U/L (ref 0–55)
ANION GAP: 9 meq/L (ref 3–11)
AST: 15 U/L (ref 5–34)
Alkaline Phosphatase: 81 U/L (ref 40–150)
BILIRUBIN TOTAL: 0.2 mg/dL (ref 0.20–1.20)
BUN: 8 mg/dL (ref 7.0–26.0)
CALCIUM: 9.6 mg/dL (ref 8.4–10.4)
CHLORIDE: 107 meq/L (ref 98–109)
CO2: 26 mEq/L (ref 22–29)
Creatinine: 0.7 mg/dL (ref 0.6–1.1)
GLUCOSE: 107 mg/dL (ref 70–140)
POTASSIUM: 4.3 meq/L (ref 3.5–5.1)
Sodium: 142 mEq/L (ref 136–145)
TOTAL PROTEIN: 6.7 g/dL (ref 6.4–8.3)

## 2013-02-16 LAB — CBC WITH DIFFERENTIAL/PLATELET
BASO%: 0.1 % (ref 0.0–2.0)
Basophils Absolute: 0 10*3/uL (ref 0.0–0.1)
EOS%: 1.1 % (ref 0.0–7.0)
Eosinophils Absolute: 0.1 10*3/uL (ref 0.0–0.5)
HEMATOCRIT: 38 % (ref 34.8–46.6)
HGB: 12.2 g/dL (ref 11.6–15.9)
LYMPH#: 1.8 10*3/uL (ref 0.9–3.3)
LYMPH%: 24.6 % (ref 14.0–49.7)
MCH: 29 pg (ref 25.1–34.0)
MCHC: 32.1 g/dL (ref 31.5–36.0)
MCV: 90.5 fL (ref 79.5–101.0)
MONO#: 0.7 10*3/uL (ref 0.1–0.9)
MONO%: 9.1 % (ref 0.0–14.0)
NEUT#: 4.9 10*3/uL (ref 1.5–6.5)
NEUT%: 65.1 % (ref 38.4–76.8)
Platelets: 328 10*3/uL (ref 145–400)
RBC: 4.2 10*6/uL (ref 3.70–5.45)
RDW: 15.9 % — ABNORMAL HIGH (ref 11.2–14.5)
WBC: 7.5 10*3/uL (ref 3.9–10.3)

## 2013-02-16 LAB — TECHNOLOGIST REVIEW

## 2013-02-16 MED ORDER — SODIUM CHLORIDE 0.9 % IV SOLN
Freq: Once | INTRAVENOUS | Status: AC
  Administered 2013-02-16: 10:00:00 via INTRAVENOUS

## 2013-02-16 MED ORDER — ONDANSETRON 8 MG/50ML IVPB (CHCC)
8.0000 mg | Freq: Once | INTRAVENOUS | Status: AC
  Administered 2013-02-16: 8 mg via INTRAVENOUS

## 2013-02-16 MED ORDER — HEPARIN SOD (PORK) LOCK FLUSH 100 UNIT/ML IV SOLN
500.0000 [IU] | Freq: Once | INTRAVENOUS | Status: AC | PRN
  Administered 2013-02-16: 500 [IU]
  Filled 2013-02-16: qty 5

## 2013-02-16 MED ORDER — DEXAMETHASONE SODIUM PHOSPHATE 10 MG/ML IJ SOLN
INTRAMUSCULAR | Status: AC
  Filled 2013-02-16: qty 1

## 2013-02-16 MED ORDER — SODIUM CHLORIDE 0.9 % IV SOLN
500.0000 mg/m2 | Freq: Once | INTRAVENOUS | Status: AC
  Administered 2013-02-16: 900 mg via INTRAVENOUS
  Filled 2013-02-16: qty 36

## 2013-02-16 MED ORDER — DEXAMETHASONE SODIUM PHOSPHATE 10 MG/ML IJ SOLN
10.0000 mg | Freq: Once | INTRAMUSCULAR | Status: AC
  Administered 2013-02-16: 10 mg via INTRAVENOUS

## 2013-02-16 MED ORDER — SODIUM CHLORIDE 0.9 % IJ SOLN
10.0000 mL | INTRAMUSCULAR | Status: DC | PRN
  Administered 2013-02-16: 10 mL
  Filled 2013-02-16: qty 10

## 2013-02-16 MED ORDER — ONDANSETRON 8 MG/NS 50 ML IVPB
INTRAVENOUS | Status: AC
  Filled 2013-02-16: qty 8

## 2013-02-16 NOTE — Progress Notes (Signed)
[place] Telephone:([phone]) [phone]   Fax:([phone]) [phone]  OFFICE PROGRESS NOTE  [name], MD [address] [address] [place] [name] [address]  Diagnosis:  Metastatic non-small cell lung cancer initially diagnosed with locally advanced stage IIIB with right Pancoast tumor involving the vertebral body as well as the foraminal canal invasion with spinal cord compression in October of 2007.   Prior Therapy:  1. Status post concurrent chemoradiation with weekly carboplatin and paclitaxel, last dose was given November 18, 2005. 2. 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 3. Status post gamma knife radiotherapy to a solitary brain lesion located in the superior frontal area of the brain at [facility] in April of 2008. 4. Status post palliative radiotherapy to the lateral abdominal wall metastatic lesion under the care of Dr. Lisbeth Renshaw, completed March of 2009. 5. Status post 6 cycles of systemic chemotherapy with carboplatin and Alimta. Last dose was given July 26, 2007 with disease stabilization. 6. Gamma knife stereotactic radiotherapy to 2 brain lesions one involving the right frontal dural based as well as right parietal lesion performed on 05/07/2012 under the care of Dr. Lilyan Punt at [facility].  Current Therapy:  Maintenance chemotherapy with Alimta 500 mg per meter squared given every 3 weeks. The patient is status post 94 cycles.   CHEMOTHERAPY INTENT: Palliative/maintenance  CURRENT # OF CHEMOTHERAPY CYCLES: 95  CURRENT ANTIEMETICS: Zofran, dexamethasone and Compazine  CURRENT SMOKING STATUS: Quit smoking 10/07/2005  ORAL CHEMOTHERAPY AND CONSENT: None  CURRENT BISPHOSPHONATES USE: None  PAIN MANAGEMENT: 0/10 currently on morphine  NARCOTICS INDUCED CONSTIPATION: No constipation  LIVING WILL AND CODE STATUS: No CODE BLUE   INTERVAL  HISTORY: Tjuana Vickrey Peralta 63 y.o. female returns to the clinic today for followup visit accompanied by her sister. The patient is feeling fine today with no specific complaints except for occasional back pain. She is tolerating her maintenance treatment with single agent Alimta fairly well with no significant adverse effects. She was seen recently by Dr. Vallarie Mare at [facility] and there was no evidence for disease recurrence in her brain. The patient denied having any chest pain, shortness of breath, cough or hemoptysis. She denied having any fever or chills, no nausea or vomiting. The patient denied having any weight loss or night sweats. She has repeat CT scan of the chest, abdomen and pelvis performed recently and she is here for evaluation and discussion of her scan results.  MEDICAL HISTORY: Past Medical History  Diagnosis Date  . Hypertension   . Anemia   . H/O: pneumonia   . History of tobacco abuse quit 10/08    on nicotine patch  . Fibromyalgia   . Hypokalemia   . DJD (degenerative joint disease), cervical   . Thrush 12/11/2010  . 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 had chemo and radiation for lung ca    ALLERGIES:  is allergic to contrast media; co-trimoxazole injection; iohexol; and sulfa drugs cross reactors.  MEDICATIONS:  Current Outpatient Prescriptions  Medication Sig Dispense Refill  . ALPRAZolam (XANAX) 0.25 MG tablet Take 1 tablet (0.25 mg total) by mouth at bedtime as needed for sleep.  30 tablet  0  . antipyrine-benzocaine (AURALGAN) otic solution Place 1 drop into both ears daily.  10 mL  1  . fluticasone (FLONASE) 50 MCG/ACT nasal spray Place 1-2 sprays  into the nose daily.  16 g  11  . folic acid (FOLVITE) 1 MG tablet TAKE 1 TABLET BY MOUTH DAILY.  90 tablet  3  . lidocaine-prilocaine (EMLA) cream Apply topically as needed.  30 g  1  . lisinopril (PRINIVIL,ZESTRIL) 10 MG tablet TAKE 1 TABLET BY MOUTH EVERY DAY  90 tablet   3  . meclizine (ANTIVERT) 12.5 MG tablet Take 1-2 tablets (12.5-25 mg total) by mouth 3 (three) times daily as needed.  30 tablet  1  . morphine (MS CONTIN) 60 MG 12 hr tablet Take 1 tablet (60 mg total) by mouth 2 (two) times daily.  60 tablet  0  . morphine (MSIR) 30 MG tablet Take 1 tablet (30 mg total) by mouth every 4 (four) hours as needed.  60 tablet  0  . olopatadine (PATANOL) 0.1 % ophthalmic solution Place 1 drop into both eyes 2 (two) times daily.  5 mL  11  . ondansetron (ZOFRAN) 8 MG tablet TAKE 1 TABLET (8 MG TOTAL) BY MOUTH EVERY 8 (EIGHT) HOURS AS NEEDED FOR NAUSEA.  30 tablet  2  . predniSONE (DELTASONE) 50 MG tablet 13 hrs, 7 hrs, and 1 hr before scheduled CT scans       No current facility-administered medications for this visit.    SURGICAL HISTORY:  Past Surgical History  Procedure Laterality Date  . Cervical laminectomy  1995  . Other surgical history  2008    Gamma knife surgery to remove brain met   . 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 Knee surgery  1990    Left x 2  . Portacath placement  -[name]    TIP IN CAVOATRIAL JUNCTION    REVIEW OF SYSTEMS:  Constitutional: negative Eyes: negative Ears, nose, mouth, throat, and face: negative Respiratory: negative Cardiovascular: negative Gastrointestinal: negative Genitourinary:negative Integument/breast: negative Hematologic/lymphatic: negative Musculoskeletal:negative Neurological: negative Behavioral/Psych: negative Endocrine: negative Allergic/Immunologic: negative   PHYSICAL EXAMINATION: General appearance: alert, cooperative and no distress Head: Normocephalic, without obvious abnormality, atraumatic Neck: no adenopathy, no JVD, supple, symmetrical, trachea midline and thyroid not enlarged, symmetric, no tenderness/mass/nodules Lymph nodes: Cervical, supraclavicular, and axillary nodes normal. Resp: clear to auscultation bilaterally Back: symmetric, no curvature.  ROM normal. No CVA tenderness. Cardio: regular rate and rhythm, S1, S2 normal, no murmur, click, rub or gallop GI: soft, non-tender; bowel sounds normal; no masses,  no organomegaly Extremities: extremities normal, atraumatic, no cyanosis or edema Neurologic: Alert and oriented X 3, normal strength and tone. Normal symmetric reflexes. Normal coordination and gait  ECOG PERFORMANCE STATUS: 0 - Asymptomatic  Blood pressure 168/83, pulse 70, temperature 98.3 F (36.8 C), temperature source Oral, resp. rate 18, height '5\' 3"'  (1.6 m), weight 159 lb 8 oz (72.349 kg).  LABORATORY DATA: Lab Results  Component Value Date   WBC 7.5 02/16/2013   HGB 12.2 02/16/2013   HCT 38.0 02/16/2013   MCV 90.5 02/16/2013   PLT 328 02/16/2013      Chemistry      Component Value Date/Time   NA 138 02/05/2013 1026   NA 142 01/26/2013 0815   K 4.3 02/05/2013 1026   K 4.6 01/26/2013 0815   CL 105 02/05/2013 1026   CL 106 06/30/2012 1004   CO2 27 02/05/2013 1026   CO2 27 01/26/2013 0815   BUN 9 02/05/2013 1026   BUN 8.6 01/26/2013 0815   CREATININE 0.6 02/05/2013 1026   CREATININE 0.7  01/26/2013 0815      Component Value Date/Time   CALCIUM 9.2 02/05/2013 1026   CALCIUM 9.7 01/26/2013 0815   ALKPHOS 75 02/05/2013 1026   ALKPHOS 87 01/26/2013 0815   AST 17 02/05/2013 1026   AST 16 01/26/2013 0815   ALT 9 02/05/2013 1026   ALT 12 01/26/2013 0815   BILITOT 0.4 02/05/2013 1026   BILITOT 0.33 01/26/2013 0815       RADIOGRAPHIC STUDIES: Ct Chest W Contrast  02/12/2013   CLINICAL DATA:  Lung cancer restaging. Metastatic disease to the brain. Patient pre-medicated for contrast allergy. No adverse reaction reported.  EXAM: CT CHEST, ABDOMEN, AND PELVIS WITH CONTRAST  TECHNIQUE: Multidetector CT imaging of the chest, abdomen and pelvis was performed following the standard protocol during bolus administration of intravenous contrast.  CONTRAST:  41m OMNIPAQUE IOHEXOL 300 MG/ML  SOLN  COMPARISON:  CT ABD/PELVIS W CM dated 12/11/2012   FINDINGS:   CT CHEST FINDINGS  Port in the right anterior chest wall. No axillary or supraclavicular lymphadenopathy. No mediastinal hilar lymphadenopathy. No pericardial fluid. Esophagus is normal.  There is a pleural-based nodule which is triangular-shaped in the right upper lobe measuring 11 mm unchanged from prior. There is pleural parenchymal thickening and centrilobular emphysema at the right lung apex also unchanged.    CT ABDOMEN AND PELVIS FINDINGS  No focal hepatic lesion. The gallbladder, pancreas, spleen, adrenal glands, and kidneys are normal.  The stomach, small bowel, appendix, cecum normal. Colon rectosigmoid colon are normal.  Abdominal aorta is normal caliber. No retroperitoneal or periportal lymphadenopathy. There is interval near complete resolution of the enhancing urothelium described on comparison exam.  No mesenteric lymphadenopathy.  No peritoneal disease.  If uterus and ovaries are normal. Bladder is normal. No aggressive osseous lesion. There is a compression fracture at T3 with focal kyphosis not changed from prior. Two levels of augmentation below this fracture.    IMPRESSION: 1. Stable exam without evidence of lung cancer recurrence or metastasis. 2. Stable nodular pleural thickening in the right lung. 3. Stable compression fracture and focal kyphosis in the upper thoracic spine   Electronically Signed   By: SSuzy BouchardM.D.   On: 02/12/2013 08:37   ASSESSMENT AND PLAN: This is a very pleasant 63years old white female with metastatic non-small cell lung cancer currently undergoing maintenance chemotherapy with single agent Alimta status post 94 cycles. The patient is tolerating her treatment fairly well with no significant adverse effects.  A recent CT scan showed no evidence for disease progression in the chest, abdomen and pelvis. I discussed the scan results with the patient and her sister. I recommended for her to continue on maintenance and Alimta as scheduled.  The patient will receive cycle #95 today. For pain management, she will continue on her current pain medication with MS Contin and MS IR She would come back for followup visit in 3 weeks with the next cycle of her chemotherapy after repeating CT scan of the chest, abdomen and pelvis for restaging of her disease. She was advised to call immediately if she has any concerning symptoms in the interval. The patient voices understanding of current disease status and treatment options and is in agreement with the current care plan.  All questions were answered. The patient knows to call the clinic with any problems, questions or concerns. We can certainly see the patient much sooner if necessary.  Disclaimer: This note was dictated with voice recognition software. Similar sounding words can inadvertently  be transcribed and may not be corrected upon review.

## 2013-02-16 NOTE — Patient Instructions (Signed)
[place] Discharge Instructions for Patients Receiving Chemotherapy  Today you received the following chemotherapy agents:  alimta  To help prevent nausea and vomiting after your treatment, we encourage you to take your nausea medication.  Take it as often as prescribed.     If you develop nausea and vomiting that is not controlled by your nausea medication, call the clinic. If it is after clinic hours your family physician or the after hours number for the clinic or go to the Emergency Department.   BELOW ARE SYMPTOMS THAT SHOULD BE REPORTED IMMEDIATELY:  *FEVER GREATER THAN 100.5 F  *CHILLS WITH OR WITHOUT FEVER  NAUSEA AND VOMITING THAT IS NOT CONTROLLED WITH YOUR NAUSEA MEDICATION  *UNUSUAL SHORTNESS OF BREATH  *UNUSUAL BRUISING OR BLEEDING  TENDERNESS IN MOUTH AND THROAT WITH OR WITHOUT PRESENCE OF ULCERS  *URINARY PROBLEMS  *BOWEL PROBLEMS  UNUSUAL RASH Items with * indicate a potential emergency and should be followed up as soon as possible.  Feel free to call the clinic you have any questions or concerns. The clinic phone number is [phone].   I have been informed and understand all the instructions given to me. I know to contact the clinic, my physician, or go to the Emergency Department if any problems should occur. I do not have any questions at this time, but understand that I may call the clinic during office hours   should I have any questions or need assistance in obtaining follow up care.    __________________________________________  _____________  __________ Signature of Patient or Authorized Representative            Date                   Time    __________________________________________ Nurse's Signature

## 2013-02-16 NOTE — Patient Instructions (Signed)
Your scan showed no evidence for disease progression. Continue chemotherapy as scheduled. Followup visit in 3 weeks.

## 2013-02-16 NOTE — Progress Notes (Signed)
02/16/13 @ 8:30 am, BMS [id number], Post-Enrollment Visit with PROs:   Danielle [name] into the [facility] for labs, to see Dr. Julien Nordmann and receive treatment with Alimta.  She had restaging scans last week and they revealed stable disease.  She completed the LCSS and EQ-5D-3L questionnaires and those were checked for completeness.  Thanked her for her continued support.

## 2013-02-16 NOTE — Telephone Encounter (Signed)
gv and printed appt sched and avs for pt for March...sed added tx

## 2013-03-01 ENCOUNTER — Other Ambulatory Visit: Payer: Self-pay | Admitting: Medical Oncology

## 2013-03-01 DIAGNOSIS — C3490 Malignant neoplasm of unspecified part of unspecified bronchus or lung: Secondary | ICD-10-CM

## 2013-03-01 MED ORDER — MORPHINE SULFATE 30 MG PO TABS: 30.0000 mg | ORAL_TABLET | ORAL | Status: DC | PRN

## 2013-03-01 MED ORDER — MORPHINE SULFATE ER 60 MG PO TBCR: 60.0000 mg | EXTENDED_RELEASE_TABLET | Freq: Two times a day (BID) | ORAL | Status: DC

## 2013-03-01 MED ORDER — ALPRAZOLAM 0.25 MG PO TABS: 0.2500 mg | ORAL_TABLET | Freq: Every evening | ORAL | Status: DC | PRN

## 2013-03-01 NOTE — Telephone Encounter (Signed)
Prescriptions locked in injection room for pt pick up .

## 2013-03-09 ENCOUNTER — Telehealth: Payer: Self-pay | Admitting: Internal Medicine

## 2013-03-09 ENCOUNTER — Ambulatory Visit: Payer: Medicare Other

## 2013-03-09 ENCOUNTER — Other Ambulatory Visit (HOSPITAL_BASED_OUTPATIENT_CLINIC_OR_DEPARTMENT_OTHER): Payer: Medicare Other

## 2013-03-09 ENCOUNTER — Ambulatory Visit (HOSPITAL_BASED_OUTPATIENT_CLINIC_OR_DEPARTMENT_OTHER): Payer: Medicare Other

## 2013-03-09 ENCOUNTER — Encounter: Payer: Self-pay | Admitting: *Deleted

## 2013-03-09 ENCOUNTER — Ambulatory Visit (HOSPITAL_BASED_OUTPATIENT_CLINIC_OR_DEPARTMENT_OTHER): Payer: Medicare Other | Admitting: Physician Assistant

## 2013-03-09 ENCOUNTER — Encounter: Payer: Self-pay | Admitting: Physician Assistant

## 2013-03-09 ENCOUNTER — Other Ambulatory Visit: Payer: Medicare Other

## 2013-03-09 VITALS — BP 181/83 | HR 86 | Temp 98.4°F | Resp 18 | Ht 63.0 in | Wt 163.3 lb

## 2013-03-09 DIAGNOSIS — C7951 Secondary malignant neoplasm of bone: Secondary | ICD-10-CM

## 2013-03-09 DIAGNOSIS — C7931 Secondary malignant neoplasm of brain: Secondary | ICD-10-CM

## 2013-03-09 DIAGNOSIS — C779 Secondary and unspecified malignant neoplasm of lymph node, unspecified: Secondary | ICD-10-CM

## 2013-03-09 DIAGNOSIS — Z5111 Encounter for antineoplastic chemotherapy: Secondary | ICD-10-CM

## 2013-03-09 DIAGNOSIS — C3490 Malignant neoplasm of unspecified part of unspecified bronchus or lung: Secondary | ICD-10-CM

## 2013-03-09 DIAGNOSIS — C7949 Secondary malignant neoplasm of other parts of nervous system: Secondary | ICD-10-CM

## 2013-03-09 DIAGNOSIS — C7952 Secondary malignant neoplasm of bone marrow: Secondary | ICD-10-CM

## 2013-03-09 DIAGNOSIS — C50919 Malignant neoplasm of unspecified site of unspecified female breast: Secondary | ICD-10-CM

## 2013-03-09 DIAGNOSIS — C7A090 Malignant carcinoid tumor of the bronchus and lung: Secondary | ICD-10-CM

## 2013-03-09 DIAGNOSIS — C3410 Malignant neoplasm of upper lobe, unspecified bronchus or lung: Secondary | ICD-10-CM

## 2013-03-09 LAB — COMPREHENSIVE METABOLIC PANEL (CC13)
ALK PHOS: 85 U/L (ref 40–150)
ALT: 9 U/L (ref 0–55)
AST: 15 U/L (ref 5–34)
Albumin: 3.3 g/dL — ABNORMAL LOW (ref 3.5–5.0)
Anion Gap: 8 mEq/L (ref 3–11)
BILIRUBIN TOTAL: 0.28 mg/dL (ref 0.20–1.20)
BUN: 7.2 mg/dL (ref 7.0–26.0)
CO2: 28 mEq/L (ref 22–29)
Calcium: 10 mg/dL (ref 8.4–10.4)
Chloride: 107 mEq/L (ref 98–109)
Creatinine: 0.7 mg/dL (ref 0.6–1.1)
Glucose: 128 mg/dl (ref 70–140)
Potassium: 4.6 mEq/L (ref 3.5–5.1)
Sodium: 142 mEq/L (ref 136–145)
TOTAL PROTEIN: 6.9 g/dL (ref 6.4–8.3)

## 2013-03-09 LAB — CBC WITH DIFFERENTIAL/PLATELET
BASO%: 0.7 % (ref 0.0–2.0)
Basophils Absolute: 0 10*3/uL (ref 0.0–0.1)
EOS ABS: 0.1 10*3/uL (ref 0.0–0.5)
EOS%: 0.8 % (ref 0.0–7.0)
HCT: 38.1 % (ref 34.8–46.6)
HGB: 12.6 g/dL (ref 11.6–15.9)
LYMPH#: 1.7 10*3/uL (ref 0.9–3.3)
LYMPH%: 25.1 % (ref 14.0–49.7)
MCH: 29.7 pg (ref 25.1–34.0)
MCHC: 33 g/dL (ref 31.5–36.0)
MCV: 90 fL (ref 79.5–101.0)
MONO#: 1 10*3/uL — ABNORMAL HIGH (ref 0.1–0.9)
MONO%: 13.9 % (ref 0.0–14.0)
NEUT%: 59.5 % (ref 38.4–76.8)
NEUTROS ABS: 4.1 10*3/uL (ref 1.5–6.5)
Platelets: 339 10*3/uL (ref 145–400)
RBC: 4.24 10*6/uL (ref 3.70–5.45)
RDW: 16.4 % — AB (ref 11.2–14.5)
WBC: 6.9 10*3/uL (ref 3.9–10.3)

## 2013-03-09 LAB — RESEARCH LABS

## 2013-03-09 MED ORDER — SODIUM CHLORIDE 0.9 % IV SOLN
Freq: Once | INTRAVENOUS | Status: AC
Start: 2013-03-09 — End: 2013-03-09
  Administered 2013-03-09: 10:00:00 via INTRAVENOUS

## 2013-03-09 MED ORDER — SODIUM CHLORIDE 0.9 % IJ SOLN
10.0000 mL | INTRAMUSCULAR | Status: DC | PRN
  Administered 2013-03-09: 10 mL
  Filled 2013-03-09: qty 10

## 2013-03-09 MED ORDER — CYANOCOBALAMIN 1000 MCG/ML IJ SOLN
INTRAMUSCULAR | Status: AC
  Filled 2013-03-09: qty 1

## 2013-03-09 MED ORDER — DEXAMETHASONE SODIUM PHOSPHATE 10 MG/ML IJ SOLN
10.0000 mg | Freq: Once | INTRAMUSCULAR | Status: AC
  Administered 2013-03-09: 10 mg via INTRAVENOUS

## 2013-03-09 MED ORDER — CYANOCOBALAMIN 1000 MCG/ML IJ SOLN
1000.0000 ug | Freq: Once | INTRAMUSCULAR | Status: AC
  Administered 2013-03-09: 1000 ug via INTRAMUSCULAR

## 2013-03-09 MED ORDER — PEMETREXED DISODIUM CHEMO INJECTION 500 MG
500.0000 mg/m2 | Freq: Once | INTRAVENOUS | Status: AC
  Administered 2013-03-09: 900 mg via INTRAVENOUS
  Filled 2013-03-09: qty 36

## 2013-03-09 MED ORDER — ONDANSETRON 8 MG/NS 50 ML IVPB
INTRAVENOUS | Status: AC
  Filled 2013-03-09: qty 8

## 2013-03-09 MED ORDER — HEPARIN SOD (PORK) LOCK FLUSH 100 UNIT/ML IV SOLN
500.0000 [IU] | Freq: Once | INTRAVENOUS | Status: AC | PRN
  Administered 2013-03-09: 500 [IU]
  Filled 2013-03-09: qty 5

## 2013-03-09 MED ORDER — ONDANSETRON 8 MG/50ML IVPB (CHCC)
8.0000 mg | Freq: Once | INTRAVENOUS | Status: AC
  Administered 2013-03-09: 8 mg via INTRAVENOUS

## 2013-03-09 MED ORDER — DEXAMETHASONE SODIUM PHOSPHATE 10 MG/ML IJ SOLN
INTRAMUSCULAR | Status: AC
  Filled 2013-03-09: qty 1

## 2013-03-09 NOTE — Telephone Encounter (Signed)
gv adn printed appt sched and avs for pt for March thru May....sed added tx.

## 2013-03-09 NOTE — Progress Notes (Signed)
03/09/13 @ 9:00 am, BMS [id number], Research Labs and PROs:  Ms. Arntz into the [facility] for treatment.  Research labs were collected with her [place] labs.  She completed the LCSS and EQ-5D-3L PROs.  She is doing well with no change in treatment for status.  Thanked her for her continued support of the study.

## 2013-03-09 NOTE — Patient Instructions (Signed)
Follow-up in 3 weeks

## 2013-03-09 NOTE — Progress Notes (Addendum)
[name]:(336) [phone]   [phone]  SHARED VISIT PROGRESS NOTE  Danielle Lofts, MD [name]. [name]  Diagnosis:  Metastatic non-small cell lung cancer initially diagnosed with locally advanced stage IIIB with right Pancoast tumor involving the vertebral body as well as the foraminal canal invasion with spinal cord compression in October of 2007.   Prior Therapy:  1. Status post concurrent chemoradiation with weekly carboplatin and paclitaxel, last dose was given November 18, 2005. 2. 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 3. Status post gamma knife radiotherapy to a solitary brain lesion located in the superior frontal area of the brain at [name] in April of 2008. 4. Status post palliative radiotherapy to the lateral abdominal wall metastatic lesion under the care of Dr. Lisbeth [name], completed March of 2009. 5. Status post 6 cycles of systemic chemotherapy with carboplatin and Alimta. Last dose was given July 26, 2007 with disease stabilization. 6. Gamma knife stereotactic radiotherapy to 2 brain lesions one involving the right frontal dural based as well as right parietal lesion performed on 05/07/2012 under the care of Dr. Lilyan Punt at [name].  Current Therapy:  Maintenance chemotherapy with Alimta 500 mg per meter squared given every 3 weeks. The patient is status post 95 cycles.   CHEMOTHERAPY INTENT: Palliative/maintenance  CURRENT # OF CHEMOTHERAPY CYCLES: 96 CURRENT ANTIEMETICS: Zofran, dexamethasone and Compazine  CURRENT SMOKING STATUS: Quit smoking 10/07/2005  ORAL CHEMOTHERAPY AND CONSENT: None  CURRENT BISPHOSPHONATES USE: None  PAIN MANAGEMENT: 0/10 currently on morphine  NARCOTICS INDUCED CONSTIPATION: No constipation  LIVING WILL AND CODE STATUS: No CODE BLUE   INTERVAL  HISTORY: [name] 63 y.o. female returns to the clinic today for followup visit accompanied by her sister. The patient is feeling fine today with no specific complaints. She is tolerating her maintenance treatment with single agent Alimta fairly well with no significant adverse effects.  The patient denied having any chest pain, shortness of breath, cough or hemoptysis. She denied having any fever or chills, no nausea or vomiting. The patient denied having any weight loss or night sweats. She presents to proceed with cycle #96 of her maintenance chemotherapy with single agent Alimta.   MEDICAL HISTORY: Past Medical History  Diagnosis Date  . Hypertension   . Anemia   . H/O: pneumonia   . History of tobacco abuse quit 10/08    on nicotine patch  . Fibromyalgia   . Hypokalemia   . DJD (degenerative joint disease), cervical   . Thrush 12/11/2010  . 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 had chemo and radiation for lung ca    ALLERGIES:  is allergic to contrast media; co-trimoxazole injection; iohexol; and sulfa drugs cross reactors.  MEDICATIONS:  Current Outpatient Prescriptions  Medication Sig Dispense Refill  . ALPRAZolam (XANAX) 0.25 MG tablet Take 1 tablet (0.25 mg total) by mouth at bedtime as needed for sleep.  30 tablet  0  . antipyrine-benzocaine (AURALGAN) otic solution Place 1 drop into both ears daily.  10 mL  1  . fluticasone (FLONASE) 50 MCG/ACT nasal spray Place 1-2 sprays into the nose daily.  16 g  11  . folic acid (FOLVITE) 1 MG tablet TAKE 1 TABLET BY MOUTH DAILY.  90 tablet  3  . lidocaine-prilocaine (EMLA) cream Apply topically  as needed.  30 g  1  . lisinopril (PRINIVIL,ZESTRIL) 10 MG tablet TAKE 1 TABLET BY MOUTH EVERY DAY  90 tablet  3  . meclizine (ANTIVERT) 12.5 MG tablet Take 1-2 tablets (12.5-25 mg total) by mouth 3 (three) times daily as needed.  30 tablet  1  . morphine (MS CONTIN) 60 MG 12 hr tablet Take 1 tablet (60  mg total) by mouth 2 (two) times daily.  60 tablet  0  . morphine (MSIR) 30 MG tablet Take 1 tablet (30 mg total) by mouth every 4 (four) hours as needed.  60 tablet  0  . olopatadine (PATANOL) 0.1 % ophthalmic solution Place 1 drop into both eyes 2 (two) times daily.  5 mL  11  . ondansetron (ZOFRAN) 8 MG tablet TAKE 1 TABLET (8 MG TOTAL) BY MOUTH EVERY 8 (EIGHT) HOURS AS NEEDED FOR NAUSEA.  30 tablet  2  . predniSONE (DELTASONE) 50 MG tablet 13 hrs, 7 hrs, and 1 hr before scheduled CT scans       No current facility-administered medications for this visit.    SURGICAL HISTORY:  Past Surgical History  Procedure Laterality Date  . Cervical laminectomy  1995  . Other surgical history  2008    Gamma knife surgery to remove brain met   . 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 Knee surgery  1990    Left x 2  . Portacath placement  -[name]    TIP IN CAVOATRIAL JUNCTION    REVIEW OF SYSTEMS:  Constitutional: negative Eyes: negative Ears, nose, mouth, throat, and face: negative Respiratory: negative Cardiovascular: negative Gastrointestinal: negative Genitourinary:negative Integument/breast: negative Hematologic/lymphatic: negative Musculoskeletal:negative Neurological: negative Behavioral/Psych: negative Endocrine: negative Allergic/Immunologic: negative   PHYSICAL EXAMINATION: General appearance: alert, cooperative and no distress Head: Normocephalic, without obvious abnormality, atraumatic Neck: no adenopathy, no JVD, supple, symmetrical, trachea midline and thyroid not enlarged, symmetric, no tenderness/mass/nodules Lymph nodes: Cervical, supraclavicular, and axillary nodes normal. Resp: clear to auscultation bilaterally Back: symmetric, no curvature. ROM normal. No CVA tenderness. Cardio: regular rate and rhythm, S1, S2 normal, no murmur, click, rub or gallop GI: soft, non-tender; bowel sounds normal; no masses,  no organomegaly Extremities:  extremities normal, atraumatic, no cyanosis or edema Neurologic: Alert and oriented X 3, normal strength and tone. Normal symmetric reflexes. Normal coordination and gait  ECOG PERFORMANCE STATUS: 0 - Asymptomatic  Blood pressure 181/83, pulse 86, temperature 98.4 F (36.9 C), temperature source Oral, resp. rate 18, height $RemoveBe'5\' 3"'[id number]$  (1.6 m), weight 163 lb 4.8 oz (74.072 kg), SpO2 98.00%.  LABORATORY DATA: Lab Results  Component Value Date   WBC 6.9 03/09/2013   HGB 12.6 03/09/2013   HCT 38.1 03/09/2013   MCV 90.0 03/09/2013   PLT 339 03/09/2013      Chemistry      Component Value Date/Time   NA 142 03/09/2013 0808   NA 138 02/05/2013 1026   K 4.6 03/09/2013 0808   K 4.3 02/05/2013 1026   CL 105 02/05/2013 1026   CL 106 06/30/2012 1004   CO2 28 03/09/2013 0808   CO2 27 02/05/2013 1026   BUN 7.2 03/09/2013 0808   BUN 9 02/05/2013 1026   CREATININE 0.7 03/09/2013 0808   CREATININE 0.6 02/05/2013 1026      Component Value Date/Time   CALCIUM 10.0 03/09/2013 0808   CALCIUM 9.2 02/05/2013 1026   ALKPHOS 85 03/09/2013 0808   ALKPHOS 75 02/05/2013  1026   AST 15 03/09/2013 0808   AST 17 02/05/2013 1026   ALT 9 03/09/2013 0808   ALT 9 02/05/2013 1026   BILITOT 0.28 03/09/2013 0808   BILITOT 0.4 02/05/2013 1026       RADIOGRAPHIC STUDIES: Ct Chest W Contrast  02/12/2013   CLINICAL DATA:  Lung cancer restaging. Metastatic disease to the brain. Patient pre-medicated for contrast allergy. No adverse reaction reported.  EXAM: CT CHEST, ABDOMEN, AND PELVIS WITH CONTRAST  TECHNIQUE: Multidetector CT imaging of the chest, abdomen and pelvis was performed following the standard protocol during bolus administration of intravenous contrast.  CONTRAST:  17m OMNIPAQUE IOHEXOL 300 MG/ML  SOLN  COMPARISON:  CT ABD/PELVIS W CM dated 12/11/2012  FINDINGS:   CT CHEST FINDINGS  Port in the right anterior chest wall. No axillary or supraclavicular lymphadenopathy. No mediastinal hilar lymphadenopathy. No pericardial fluid. Esophagus is  normal.  There is a pleural-based nodule which is triangular-shaped in the right upper lobe measuring 11 mm unchanged from prior. There is pleural parenchymal thickening and centrilobular emphysema at the right lung apex also unchanged.    CT ABDOMEN AND PELVIS FINDINGS  No focal hepatic lesion. The gallbladder, pancreas, spleen, adrenal glands, and kidneys are normal.  The stomach, small bowel, appendix, cecum normal. Colon rectosigmoid colon are normal.  Abdominal aorta is normal caliber. No retroperitoneal or periportal lymphadenopathy. There is interval near complete resolution of the enhancing urothelium described on comparison exam.  No mesenteric lymphadenopathy.  No peritoneal disease.  If uterus and ovaries are normal. Bladder is normal. No aggressive osseous lesion. There is a compression fracture at T3 with focal kyphosis not changed from prior. Two levels of augmentation below this fracture.    IMPRESSION: 1. Stable exam without evidence of lung cancer recurrence or metastasis. 2. Stable nodular pleural thickening in the right lung. 3. Stable compression fracture and focal kyphosis in the upper thoracic spine   Electronically Signed   By: SSuzy BouchardM.D.   On: 02/12/2013 08:37   ASSESSMENT AND PLAN: This is a very pleasant 63years old white female with metastatic non-small cell lung cancer currently undergoing maintenance chemotherapy with single agent Alimta status post 94 cycles. The patient is tolerating her treatment fairly well with no significant adverse effects.  A recent CT scan showed no evidence for disease progression in the chest, abdomen and pelvis. Patient was discussed with and also seen by Dr. MJulien Nordmann She will  continue on maintenance and Alimta as scheduled. The patient will receive cycle #96 today. For pain management, she will continue on her current pain medication with MS Contin and MS IR She would come back for followup visit in 3 weeks with the next cycle of her  chemotherapy.  She was advised to call immediately if she has any concerning symptoms in the interval. The patient voices understanding of current disease status and treatment options and is in agreement with the current care plan.  All questions were answered. The patient knows to call the clinic with any problems, questions or concerns. We can certainly see the patient much sooner if necessary.  JCarlton AdamPA-C  ADDENDUM: Hematology/Oncology Attending: I had a face to face encounter with the patient today. I recommended her care plan. She is a very pleasant 63years old white female with metastatic non-small cell lung cancer currently undergoing maintenance chemotherapy status post 95 cycles. She is tolerating her treatment fairly well with no significant adverse effects. The patient denied having  any significant nausea or vomiting, no fever or chills. She denied having any significant weight loss or night sweats. We will proceed with cycle #96 today as scheduled. She would come back for followup visit in 3 weeks with the next cycle of her treatment.  Disclaimer: This note was dictated with voice recognition software. Similar sounding words can inadvertently be transcribed and may not be corrected upon review. Eilleen Kempf., MD 03/09/2013

## 2013-03-09 NOTE — Patient Instructions (Signed)
[facility] Discharge Instructions for Patients Receiving Chemotherapy  Today you received the following chemotherapy agents: Alimta  To help prevent nausea and vomiting after your treatment, we encourage you to take your nausea medication as prescribed.   If you develop nausea and vomiting that is not controlled by your nausea medication, call the clinic.   BELOW ARE SYMPTOMS THAT SHOULD BE REPORTED IMMEDIATELY:  *FEVER GREATER THAN 100.5 F  *CHILLS WITH OR WITHOUT FEVER  NAUSEA AND VOMITING THAT IS NOT CONTROLLED WITH YOUR NAUSEA MEDICATION  *UNUSUAL SHORTNESS OF BREATH  *UNUSUAL BRUISING OR BLEEDING  TENDERNESS IN MOUTH AND THROAT WITH OR WITHOUT PRESENCE OF ULCERS  *URINARY PROBLEMS  *BOWEL PROBLEMS  UNUSUAL RASH Items with * indicate a potential emergency and should be followed up as soon as possible.  Feel free to call the clinic you have any questions or concerns. The clinic phone number is [phone].

## 2013-03-29 ENCOUNTER — Other Ambulatory Visit: Payer: Self-pay | Admitting: Internal Medicine

## 2013-03-30 ENCOUNTER — Other Ambulatory Visit (HOSPITAL_BASED_OUTPATIENT_CLINIC_OR_DEPARTMENT_OTHER): Payer: Medicare Other

## 2013-03-30 ENCOUNTER — Other Ambulatory Visit: Payer: Medicare Other

## 2013-03-30 ENCOUNTER — Ambulatory Visit (HOSPITAL_BASED_OUTPATIENT_CLINIC_OR_DEPARTMENT_OTHER): Payer: Medicare Other | Admitting: Physician Assistant

## 2013-03-30 ENCOUNTER — Ambulatory Visit (HOSPITAL_BASED_OUTPATIENT_CLINIC_OR_DEPARTMENT_OTHER): Payer: Medicare Other

## 2013-03-30 ENCOUNTER — Ambulatory Visit: Payer: Medicare Other

## 2013-03-30 ENCOUNTER — Telehealth: Payer: Self-pay | Admitting: Internal Medicine

## 2013-03-30 ENCOUNTER — Encounter: Payer: Self-pay | Admitting: *Deleted

## 2013-03-30 ENCOUNTER — Encounter: Payer: Self-pay | Admitting: Physician Assistant

## 2013-03-30 VITALS — BP 161/76 | HR 93 | Temp 98.5°F | Resp 18 | Ht 63.0 in | Wt 164.8 lb

## 2013-03-30 DIAGNOSIS — C7952 Secondary malignant neoplasm of bone marrow: Secondary | ICD-10-CM

## 2013-03-30 DIAGNOSIS — C7931 Secondary malignant neoplasm of brain: Secondary | ICD-10-CM

## 2013-03-30 DIAGNOSIS — C7949 Secondary malignant neoplasm of other parts of nervous system: Secondary | ICD-10-CM

## 2013-03-30 DIAGNOSIS — C3410 Malignant neoplasm of upper lobe, unspecified bronchus or lung: Secondary | ICD-10-CM

## 2013-03-30 DIAGNOSIS — C7951 Secondary malignant neoplasm of bone: Secondary | ICD-10-CM

## 2013-03-30 DIAGNOSIS — Z5111 Encounter for antineoplastic chemotherapy: Secondary | ICD-10-CM | POA: Diagnosis not present

## 2013-03-30 DIAGNOSIS — B370 Candidal stomatitis: Secondary | ICD-10-CM | POA: Diagnosis not present

## 2013-03-30 DIAGNOSIS — C3490 Malignant neoplasm of unspecified part of unspecified bronchus or lung: Secondary | ICD-10-CM

## 2013-03-30 DIAGNOSIS — I10 Essential (primary) hypertension: Secondary | ICD-10-CM

## 2013-03-30 LAB — CBC WITH DIFFERENTIAL/PLATELET
BASO%: 0.7 % (ref 0.0–2.0)
BASOS ABS: 0.1 10*3/uL (ref 0.0–0.1)
EOS ABS: 0.1 10*3/uL (ref 0.0–0.5)
EOS%: 1 % (ref 0.0–7.0)
HCT: 39.1 % (ref 34.8–46.6)
HGB: 13.1 g/dL (ref 11.6–15.9)
LYMPH#: 1.5 10*3/uL (ref 0.9–3.3)
LYMPH%: 18.3 % (ref 14.0–49.7)
MCH: 30.5 pg (ref 25.1–34.0)
MCHC: 33.5 g/dL (ref 31.5–36.0)
MCV: 91 fL (ref 79.5–101.0)
MONO#: 0.9 10*3/uL (ref 0.1–0.9)
MONO%: 11.6 % (ref 0.0–14.0)
NEUT%: 68.4 % (ref 38.4–76.8)
NEUTROS ABS: 5.4 10*3/uL (ref 1.5–6.5)
Platelets: 310 10*3/uL (ref 145–400)
RBC: 4.3 10*6/uL (ref 3.70–5.45)
RDW: 15.5 % — AB (ref 11.2–14.5)
WBC: 7.9 10*3/uL (ref 3.9–10.3)

## 2013-03-30 LAB — COMPREHENSIVE METABOLIC PANEL (CC13)
ALBUMIN: 3.3 g/dL — AB (ref 3.5–5.0)
ALK PHOS: 93 U/L (ref 40–150)
ALT: 8 U/L (ref 0–55)
AST: 15 U/L (ref 5–34)
Anion Gap: 9 mEq/L (ref 3–11)
BUN: 7.1 mg/dL (ref 7.0–26.0)
CALCIUM: 9.7 mg/dL (ref 8.4–10.4)
CHLORIDE: 109 meq/L (ref 98–109)
CO2: 26 mEq/L (ref 22–29)
Creatinine: 0.7 mg/dL (ref 0.6–1.1)
Glucose: 118 mg/dl (ref 70–140)
POTASSIUM: 4.3 meq/L (ref 3.5–5.1)
SODIUM: 144 meq/L (ref 136–145)
TOTAL PROTEIN: 7.1 g/dL (ref 6.4–8.3)
Total Bilirubin: 0.31 mg/dL (ref 0.20–1.20)

## 2013-03-30 MED ORDER — ALPRAZOLAM 0.25 MG PO TABS: 0.2500 mg | ORAL_TABLET | Freq: Every evening | ORAL | Status: DC | PRN

## 2013-03-30 MED ORDER — MORPHINE SULFATE ER 60 MG PO TBCR: 60.0000 mg | EXTENDED_RELEASE_TABLET | Freq: Two times a day (BID) | ORAL | Status: DC

## 2013-03-30 MED ORDER — DEXAMETHASONE SODIUM PHOSPHATE 10 MG/ML IJ SOLN
INTRAMUSCULAR | Status: AC
  Filled 2013-03-30: qty 1

## 2013-03-30 MED ORDER — ONDANSETRON 8 MG/50ML IVPB (CHCC)
8.0000 mg | Freq: Once | INTRAVENOUS | Status: AC
  Administered 2013-03-30: 8 mg via INTRAVENOUS

## 2013-03-30 MED ORDER — SODIUM CHLORIDE 0.9 % IV SOLN
500.0000 mg/m2 | Freq: Once | INTRAVENOUS | Status: AC
  Administered 2013-03-30: 900 mg via INTRAVENOUS
  Filled 2013-03-30: qty 36

## 2013-03-30 MED ORDER — HEPARIN SOD (PORK) LOCK FLUSH 100 UNIT/ML IV SOLN
500.0000 [IU] | Freq: Once | INTRAVENOUS | Status: AC | PRN
  Administered 2013-03-30: 500 [IU]
  Filled 2013-03-30: qty 5

## 2013-03-30 MED ORDER — MORPHINE SULFATE 30 MG PO TABS: 30.0000 mg | ORAL_TABLET | ORAL | Status: DC | PRN

## 2013-03-30 MED ORDER — SODIUM CHLORIDE 0.9 % IV SOLN
Freq: Once | INTRAVENOUS | Status: AC
  Administered 2013-03-30: 10:00:00 via INTRAVENOUS

## 2013-03-30 MED ORDER — DEXAMETHASONE SODIUM PHOSPHATE 10 MG/ML IJ SOLN
10.0000 mg | Freq: Once | INTRAMUSCULAR | Status: AC
  Administered 2013-03-30: 10 mg via INTRAVENOUS

## 2013-03-30 MED ORDER — FLUCONAZOLE 100 MG PO TABS: ORAL_TABLET | ORAL | Status: DC

## 2013-03-30 MED ORDER — ONDANSETRON 8 MG/NS 50 ML IVPB
INTRAVENOUS | Status: AC
Start: 2013-03-30 — End: 2013-03-30
  Filled 2013-03-30: qty 8

## 2013-03-30 MED ORDER — SODIUM CHLORIDE 0.9 % IJ SOLN
10.0000 mL | INTRAMUSCULAR | Status: DC | PRN
  Administered 2013-03-30: 10 mL
  Filled 2013-03-30: qty 10

## 2013-03-30 NOTE — Patient Instructions (Signed)
Followup in 3 weeks with a restaging CT scan of your chest, abdomen and pelvis to reevaluate her disease. Remember to take your prednisone and Benadryl prior to your CT scan as you have done previously

## 2013-03-30 NOTE — Telephone Encounter (Signed)
gv adn printed appt sched and avs for pt for April adn May....sed added tx.

## 2013-03-30 NOTE — Progress Notes (Signed)
03/30/13 @ 8:30 am, BMS [id number], Questionnaires Only:  [name] into the [facility] for labs, to see [name], PA, and to receive treatment with Alimta.  She completed the LCSS and EQ-[id number] PROs in the lobby prior to all other activities.  She has had no change in her treatment plan and her disease is stable.

## 2013-03-30 NOTE — Progress Notes (Signed)
[place] Telephone:(336) [phone]   Fax:([phone]  OFFICE PROGRESS NOTE  [name], MD [address]. [name] [address]  Diagnosis:  Metastatic non-small cell lung cancer initially diagnosed with locally advanced stage IIIB with right Pancoast tumor involving the vertebral body as well as the foraminal canal invasion with spinal cord compression in October of 2007.   Prior Therapy:  1. Status post concurrent chemoradiation with weekly carboplatin and paclitaxel, last dose was given November 18, 2005. 2. 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 3. Status post gamma knife radiotherapy to a solitary brain lesion located in the superior frontal area of the brain at [facility] in April of 2008. 4. Status post palliative radiotherapy to the lateral abdominal wall metastatic lesion under the care of Dr. Lisbeth Renshaw, completed March of 2009. 5. Status post 6 cycles of systemic chemotherapy with carboplatin and Alimta. Last dose was given July 26, 2007 with disease stabilization. 6. Gamma knife stereotactic radiotherapy to 2 brain lesions one involving the right frontal dural based as well as right parietal lesion performed on 05/07/2012 under the care of Dr. Lilyan Punt at [facility].  Current Therapy:  Maintenance chemotherapy with Alimta 500 mg per meter squared given every 3 weeks. The patient is status post 96 cycles.   CHEMOTHERAPY INTENT: Palliative/maintenance  CURRENT # OF CHEMOTHERAPY CYCLES: 97 CURRENT ANTIEMETICS: Zofran, dexamethasone and Compazine  CURRENT SMOKING STATUS: Quit smoking 10/07/2005  ORAL CHEMOTHERAPY AND CONSENT: None  CURRENT BISPHOSPHONATES USE: None  PAIN MANAGEMENT: 0/10 currently on morphine  NARCOTICS INDUCED CONSTIPATION: No constipation  LIVING WILL AND CODE STATUS: No CODE BLUE   INTERVAL  HISTORY: [name] 63 y.o. female returns to the clinic today for followup visit accompanied by her sister. The patient is feeling fine today with no specific complaints. She is tolerating her maintenance treatment with single agent Alimta fairly well with no significant adverse effects. She reports some increased sodium intake with her diet lately. The patient denied having any chest pain, shortness of breath, cough or hemoptysis. She denied having any fever or chills, no nausea or vomiting. The patient denied having any weight loss or night sweats. She request refill prescriptions for her MS Contin, MSIR and Xanax. She presents to proceed with cycle #97 of her maintenance chemotherapy with single agent Alimta.   MEDICAL HISTORY: Past Medical History  Diagnosis Date  . Hypertension   . Anemia   . H/O: pneumonia   . History of tobacco abuse quit 10/08    on nicotine patch  . Fibromyalgia   . Hypokalemia   . DJD (degenerative joint disease), cervical   . Thrush 12/11/2010  . 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 had chemo and radiation for lung ca    ALLERGIES:  is allergic to contrast media; co-trimoxazole injection; iohexol; and sulfa drugs cross reactors.  MEDICATIONS:  Current Outpatient Prescriptions  Medication Sig Dispense Refill  . ALPRAZolam (XANAX) 0.25 MG tablet Take 1 tablet (0.25 mg total) by mouth at bedtime as needed for sleep.  30 tablet  0  . amLODipine (NORVASC) 10 MG tablet Take 10 mg by mouth.      Marland Kitchen antipyrine-benzocaine (AURALGAN) otic solution Place 1 drop into both ears daily.  10 mL  1  . fluticasone (FLONASE) 50 MCG/ACT nasal spray Place 1-2 sprays  into the nose daily.  16 g  11  . folic acid (FOLVITE) 1 MG tablet TAKE 1 TABLET BY MOUTH DAILY.  90 tablet  3  . lidocaine-prilocaine (EMLA) cream Apply topically as needed.  30 g  1  . lisinopril (PRINIVIL,ZESTRIL) 10 MG tablet TAKE 1 TABLET BY MOUTH EVERY DAY  90 tablet  3   . meclizine (ANTIVERT) 12.5 MG tablet Take 1-2 tablets (12.5-25 mg total) by mouth 3 (three) times daily as needed.  30 tablet  1  . morphine (MS CONTIN) 60 MG 12 hr tablet Take 1 tablet (60 mg total) by mouth 2 (two) times daily.  60 tablet  0  . morphine (MSIR) 30 MG tablet Take 1 tablet (30 mg total) by mouth every 4 (four) hours as needed.  60 tablet  0  . ondansetron (ZOFRAN) 8 MG tablet TAKE 1 TABLET (8 MG TOTAL) BY MOUTH EVERY 8 (EIGHT) HOURS AS NEEDED FOR NAUSEA.  30 tablet  2  . predniSONE (DELTASONE) 50 MG tablet 13 hrs, 7 hrs, and 1 hr before scheduled CT scans      . simvastatin (ZOCOR) 10 MG tablet Take 10 mg by mouth.      . fluconazole (DIFLUCAN) 100 MG tablet Take 2 tablets by mouth on day one, then take 1 tablet by mouth daily until completed.Do not take simvastatin while taking fluconazole  16 tablet  0  . olopatadine (PATANOL) 0.1 % ophthalmic solution Place 1 drop into both eyes 2 (two) times daily.  5 mL  11   No current facility-administered medications for this visit.   Facility-Administered Medications Ordered in Other Visits  Medication Dose Route Frequency Provider Last Rate Last Dose  . sodium chloride 0.9 % injection 10 mL  10 mL Intracatheter PRN Curt Bears, MD   10 mL at 03/30/13 1041    SURGICAL HISTORY:  Past Surgical History  Procedure Laterality Date  . Cervical laminectomy  1995  . Other surgical history  2008    Gamma knife surgery to remove brain met   . 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 Knee surgery  1990    Left x 2  . Portacath placement  -[name]    TIP IN CAVOATRIAL JUNCTION    REVIEW OF SYSTEMS:  Constitutional: negative Eyes: negative Ears, nose, mouth, throat, and face: positive for thrush Respiratory: negative Cardiovascular: negative Gastrointestinal: negative Genitourinary:negative Integument/breast: negative Hematologic/lymphatic: negative Musculoskeletal:negative Neurological:  negative Behavioral/Psych: negative Endocrine: negative Allergic/Immunologic: negative   PHYSICAL EXAMINATION: General appearance: alert, cooperative and no distress Head: Normocephalic, without obvious abnormality, atraumatic Neck: no adenopathy, no JVD, supple, symmetrical, trachea midline and thyroid not enlarged, symmetric, no tenderness/mass/nodules Lymph nodes: Cervical, supraclavicular, and axillary nodes normal. Resp: clear to auscultation bilaterally Back: symmetric, no curvature. ROM normal. No CVA tenderness. Cardio: regular rate and rhythm, S1, S2 normal, no murmur, click, rub or gallop GI: soft, non-tender; bowel sounds normal; no masses,  no organomegaly Extremities: extremities normal, atraumatic, no cyanosis or edema Neurologic: Alert and oriented X 3, normal strength and tone. Normal symmetric reflexes. Normal coordination and gait Mouth: reveals thrush, primarily on tongue . Buccal mucosa minimally affected  ECOG PERFORMANCE STATUS: 0 - Asymptomatic  Blood pressure 161/76, pulse 93, temperature 98.5 F (36.9 C), temperature source Oral, resp. rate 18, height _0  (1.6 m), weight 164 lb 12.8 oz (74.753 kg), SpO2 98.00%.  LABORATORY DATA: Lab Results  Component Value Date  WBC 7.9 03/30/2013   HGB 13.1 03/30/2013   HCT 39.1 03/30/2013   MCV 91.0 03/30/2013   PLT 310 03/30/2013      Chemistry      Component Value Date/Time   NA 144 03/30/2013 0819   NA 138 02/05/2013 1026   K 4.3 03/30/2013 0819   K 4.3 02/05/2013 1026   CL 105 02/05/2013 1026   CL 106 06/30/2012 1004   CO2 26 03/30/2013 0819   CO2 27 02/05/2013 1026   BUN 7.1 03/30/2013 0819   BUN 9 02/05/2013 1026   CREATININE 0.7 03/30/2013 0819   CREATININE 0.6 02/05/2013 1026      Component Value Date/Time   CALCIUM 9.7 03/30/2013 0819   CALCIUM 9.2 02/05/2013 1026   ALKPHOS 93 03/30/2013 0819   ALKPHOS 75 02/05/2013 1026   AST 15 03/30/2013 0819   AST 17 02/05/2013 1026   ALT 8 03/30/2013 0819   ALT 9 02/05/2013  1026   BILITOT 0.31 03/30/2013 0819   BILITOT 0.4 02/05/2013 1026       RADIOGRAPHIC STUDIES: Ct Chest W Contrast  02/12/2013   CLINICAL DATA:  Lung cancer restaging. Metastatic disease to the brain. Patient pre-medicated for contrast allergy. No adverse reaction reported.  EXAM: CT CHEST, ABDOMEN, AND PELVIS WITH CONTRAST  TECHNIQUE: Multidetector CT imaging of the chest, abdomen and pelvis was performed following the standard protocol during bolus administration of intravenous contrast.  CONTRAST:  63m OMNIPAQUE IOHEXOL 300 MG/ML  SOLN  COMPARISON:  CT ABD/PELVIS W CM dated 12/11/2012  FINDINGS:   CT CHEST FINDINGS  Port in the right anterior chest wall. No axillary or supraclavicular lymphadenopathy. No mediastinal hilar lymphadenopathy. No pericardial fluid. Esophagus is normal.  There is a pleural-based nodule which is triangular-shaped in the right upper lobe measuring 11 mm unchanged from prior. There is pleural parenchymal thickening and centrilobular emphysema at the right lung apex also unchanged.    CT ABDOMEN AND PELVIS FINDINGS  No focal hepatic lesion. The gallbladder, pancreas, spleen, adrenal glands, and kidneys are normal.  The stomach, small bowel, appendix, cecum normal. Colon rectosigmoid colon are normal.  Abdominal aorta is normal caliber. No retroperitoneal or periportal lymphadenopathy. There is interval near complete resolution of the enhancing urothelium described on comparison exam.  No mesenteric lymphadenopathy.  No peritoneal disease.  If uterus and ovaries are normal. Bladder is normal. No aggressive osseous lesion. There is a compression fracture at T3 with focal kyphosis not changed from prior. Two levels of augmentation below this fracture.    IMPRESSION: 1. Stable exam without evidence of lung cancer recurrence or metastasis. 2. Stable nodular pleural thickening in the right lung. 3. Stable compression fracture and focal kyphosis in the upper thoracic spine    Electronically Signed   By: SSuzy BouchardM.D.   On: 02/12/2013 08:37   ASSESSMENT AND PLAN: This is a very pleasant 63years old white female with metastatic non-small cell lung cancer currently undergoing maintenance chemotherapy with single agent Alimta status post 96 cycles. The patient is tolerating her treatment fairly well with no significant adverse effects.  Her last CT scan showed no evidence for disease progression in the chest, abdomen and pelvis. She will  continue on maintenance and Alimta as scheduled. The patient will receive cycle #97 today. For pain management, she will continue on her current pain medication with MS Contin and MS IR. She was given refill prescriptions for the MS Contin, MSIR and Xanax. For the oral candidiasis  a prescription for Diflucan was sent her pharmacy of record via E. scribed. The patient understands not to take her simvastatin while she is taking the fluconazole. She will followup in 3 weeks with Dr. Julien Nordmann with a restaging CT scan of her chest, abdomen and pelvis with contrast to reevaluate her disease. She will continue to take premedication prednisone and Benadryl prior to the CT scans as prophylaxis  She was advised to call immediately if she has any concerning symptoms in the interval. The patient voices understanding of current disease status and treatment options and is in agreement with the current care plan.  All questions were answered. The patient knows to call the clinic with any problems, questions or concerns. We can certainly see the patient much sooner if necessary.  Carlton Adam, PA-C   Disclaimer: This note was dictated with voice recognition software. Similar sounding words can inadvertently be transcribed and may not be corrected upon review.

## 2013-03-30 NOTE — Patient Instructions (Signed)
[facility] Discharge Instructions for Patients Receiving Chemotherapy  Today you received the following chemotherapy agents:  Alimta  To help prevent nausea and vomiting after your treatment, we encourage you to take your nausea medication as ordered per MD.   If you develop nausea and vomiting that is not controlled by your nausea medication, call the clinic.   BELOW ARE SYMPTOMS THAT SHOULD BE REPORTED IMMEDIATELY:  *FEVER GREATER THAN 100.5 F  *CHILLS WITH OR WITHOUT FEVER  NAUSEA AND VOMITING THAT IS NOT CONTROLLED WITH YOUR NAUSEA MEDICATION  *UNUSUAL SHORTNESS OF BREATH  *UNUSUAL BRUISING OR BLEEDING  TENDERNESS IN MOUTH AND THROAT WITH OR WITHOUT PRESENCE OF ULCERS  *URINARY PROBLEMS  *BOWEL PROBLEMS  UNUSUAL RASH Items with * indicate a potential emergency and should be followed up as soon as possible.  Feel free to call the clinic you have any questions or concerns. The clinic phone number is [phone].

## 2013-04-12 ENCOUNTER — Telehealth: Payer: Self-pay | Admitting: Family Medicine

## 2013-04-12 DIAGNOSIS — C7931 Secondary malignant neoplasm of brain: Secondary | ICD-10-CM | POA: Diagnosis not present

## 2013-04-12 DIAGNOSIS — G9389 Other specified disorders of brain: Secondary | ICD-10-CM | POA: Diagnosis not present

## 2013-04-12 DIAGNOSIS — C3490 Malignant neoplasm of unspecified part of unspecified bronchus or lung: Secondary | ICD-10-CM | POA: Diagnosis not present

## 2013-04-12 DIAGNOSIS — C7949 Secondary malignant neoplasm of other parts of nervous system: Secondary | ICD-10-CM | POA: Diagnosis not present

## 2013-04-12 NOTE — Telephone Encounter (Signed)
I will defer to Dr. B here.

## 2013-04-12 NOTE — Telephone Encounter (Signed)
Let pt know we can increase lisinopril further.. She is on very low dose. She can increase to 20 mg daily  ( max dose is 40 mg daily) That is what I would recommend. Or if she would like we can switch back to amlodipine. Send in rx or change med list as needed.

## 2013-04-12 NOTE — Telephone Encounter (Signed)
Patient Information:  Caller Name: [place]  Phone: [phone]  Patient: Danielle [name]  Gender: Female  DOB: 09-30-1950  Age: 63 Years  PCP: [name] (Family Practice)  Office Follow Up:  Does the office need to follow up with this patient?: Yes  Instructions For The Office: Pls see RN note  RN Note:  Elevated BP after med changes. 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  Pt wants to ask Dr Diona Browner if she should restart Amlodipine 10mg .  Pt currently takes Lisinopril 10mg , med was increased from 5mg  approx 2 months ago, Pt states it's not helping.  All emergent sxs ruled out per High Blood Pressure protocol, home care d/t BP under 140/90.  Please review w/ Dr Diona Browner: Pt's request to restart Amlodipine 10mg  w/ hx of TIA's.  Symptoms  Reason For Call & Symptoms: Elevated BP after med changes.  Reviewed Health History In EMR: Yes  Reviewed Medications In EMR: Yes  Reviewed Allergies In EMR: Yes  Reviewed Surgeries / Procedures: Yes  Date of Onset of Symptoms: 01/07/2013  Treatments Tried: Lisinopril, Last Chemo was 3-26.  Treatments Tried Worked: No  Guideline(s) Used:  High Blood Pressure  Disposition Per Guideline:   Home Care  Reason For Disposition Reached:   BP < 140 /90 and taking BP medications  Advice Given:  N/A  Patient Will Follow Care Advice:  YES

## 2013-04-13 NOTE — Telephone Encounter (Signed)
Patient notified as instructed by telephone.  She would like to try increasing the lisinopril to 20 mg daily.  She will take 2 Lisinopril 10mg  tablets daily until she uses those up and then call me for a new prescription of Lisinopril 20 mg.  Updated on Medication List.

## 2013-04-16 ENCOUNTER — Encounter (HOSPITAL_COMMUNITY): Payer: Self-pay

## 2013-04-16 ENCOUNTER — Ambulatory Visit (HOSPITAL_COMMUNITY)
Admission: RE | Admit: 2013-04-16 | Discharge: 2013-04-16 | Disposition: A | Discharge status: Home / Self Care | Payer: Medicare Other | Source: Ambulatory Visit | Attending: Physician Assistant | Admitting: Physician Assistant

## 2013-04-16 ENCOUNTER — Ambulatory Visit (HOSPITAL_COMMUNITY): Payer: Medicare Other

## 2013-04-16 DIAGNOSIS — J984 Other disorders of lung: Secondary | ICD-10-CM | POA: Diagnosis not present

## 2013-04-16 DIAGNOSIS — J449 Chronic obstructive pulmonary disease, unspecified: Secondary | ICD-10-CM | POA: Insufficient documentation

## 2013-04-16 DIAGNOSIS — M8448XA Pathological fracture, other site, initial encounter for fracture: Secondary | ICD-10-CM | POA: Insufficient documentation

## 2013-04-16 DIAGNOSIS — C7931 Secondary malignant neoplasm of brain: Secondary | ICD-10-CM | POA: Insufficient documentation

## 2013-04-16 DIAGNOSIS — C3490 Malignant neoplasm of unspecified part of unspecified bronchus or lung: Secondary | ICD-10-CM | POA: Insufficient documentation

## 2013-04-16 DIAGNOSIS — C7949 Secondary malignant neoplasm of other parts of nervous system: Secondary | ICD-10-CM | POA: Diagnosis not present

## 2013-04-16 DIAGNOSIS — J4489 Other specified chronic obstructive pulmonary disease: Secondary | ICD-10-CM | POA: Insufficient documentation

## 2013-04-16 DIAGNOSIS — I2510 Atherosclerotic heart disease of native coronary artery without angina pectoris: Secondary | ICD-10-CM | POA: Insufficient documentation

## 2013-04-16 MED ORDER — IOHEXOL 300 MG/ML  SOLN
100.0000 mL | Freq: Once | INTRAMUSCULAR | Status: AC | PRN
  Administered 2013-04-16: 100 mL via INTRAVENOUS

## 2013-04-20 ENCOUNTER — Ambulatory Visit (HOSPITAL_BASED_OUTPATIENT_CLINIC_OR_DEPARTMENT_OTHER): Payer: Medicare Other

## 2013-04-20 ENCOUNTER — Telehealth: Payer: Self-pay | Admitting: Internal Medicine

## 2013-04-20 ENCOUNTER — Other Ambulatory Visit: Payer: Medicare Other

## 2013-04-20 ENCOUNTER — Ambulatory Visit (HOSPITAL_BASED_OUTPATIENT_CLINIC_OR_DEPARTMENT_OTHER): Payer: Medicare Other | Admitting: Physician Assistant

## 2013-04-20 ENCOUNTER — Encounter: Payer: Self-pay | Admitting: *Deleted

## 2013-04-20 ENCOUNTER — Encounter: Payer: Self-pay | Admitting: Physician Assistant

## 2013-04-20 ENCOUNTER — Other Ambulatory Visit (HOSPITAL_BASED_OUTPATIENT_CLINIC_OR_DEPARTMENT_OTHER): Payer: Medicare Other

## 2013-04-20 ENCOUNTER — Ambulatory Visit: Payer: Medicare Other

## 2013-04-20 ENCOUNTER — Telehealth: Payer: Self-pay | Admitting: Family Medicine

## 2013-04-20 VITALS — BP 159/85 | HR 94 | Temp 97.9°F | Resp 18 | Ht 63.0 in | Wt 161.3 lb

## 2013-04-20 DIAGNOSIS — C3410 Malignant neoplasm of upper lobe, unspecified bronchus or lung: Secondary | ICD-10-CM | POA: Diagnosis not present

## 2013-04-20 DIAGNOSIS — C7949 Secondary malignant neoplasm of other parts of nervous system: Secondary | ICD-10-CM | POA: Diagnosis not present

## 2013-04-20 DIAGNOSIS — C3490 Malignant neoplasm of unspecified part of unspecified bronchus or lung: Secondary | ICD-10-CM

## 2013-04-20 DIAGNOSIS — Z5111 Encounter for antineoplastic chemotherapy: Secondary | ICD-10-CM | POA: Diagnosis not present

## 2013-04-20 DIAGNOSIS — R110 Nausea: Secondary | ICD-10-CM | POA: Diagnosis not present

## 2013-04-20 DIAGNOSIS — B370 Candidal stomatitis: Secondary | ICD-10-CM | POA: Diagnosis not present

## 2013-04-20 DIAGNOSIS — C7951 Secondary malignant neoplasm of bone: Secondary | ICD-10-CM

## 2013-04-20 DIAGNOSIS — C7931 Secondary malignant neoplasm of brain: Secondary | ICD-10-CM

## 2013-04-20 DIAGNOSIS — C7952 Secondary malignant neoplasm of bone marrow: Secondary | ICD-10-CM

## 2013-04-20 LAB — CBC WITH DIFFERENTIAL/PLATELET
BASO%: 0.4 % (ref 0.0–2.0)
BASOS ABS: 0 10*3/uL (ref 0.0–0.1)
EOS%: 0.6 % (ref 0.0–7.0)
Eosinophils Absolute: 0 10*3/uL (ref 0.0–0.5)
HEMATOCRIT: 39.2 % (ref 34.8–46.6)
HEMOGLOBIN: 12.9 g/dL (ref 11.6–15.9)
LYMPH%: 21.6 % (ref 14.0–49.7)
MCH: 30.2 pg (ref 25.1–34.0)
MCHC: 33 g/dL (ref 31.5–36.0)
MCV: 91.6 fL (ref 79.5–101.0)
MONO#: 0.7 10*3/uL (ref 0.1–0.9)
MONO%: 9 % (ref 0.0–14.0)
NEUT#: 5.3 10*3/uL (ref 1.5–6.5)
NEUT%: 68.4 % (ref 38.4–76.8)
PLATELETS: 454 10*3/uL — AB (ref 145–400)
RBC: 4.28 10*6/uL (ref 3.70–5.45)
RDW: 14.1 % (ref 11.2–14.5)
WBC: 7.8 10*3/uL (ref 3.9–10.3)
lymph#: 1.7 10*3/uL (ref 0.9–3.3)

## 2013-04-20 LAB — COMPREHENSIVE METABOLIC PANEL (CC13)
ALT: <6 U/L (ref 0–55)
AST: 14 U/L (ref 5–34)
Albumin: 3.5 g/dL (ref 3.5–5.0)
Alkaline Phosphatase: 81 U/L (ref 40–150)
Anion Gap: 13 mEq/L — ABNORMAL HIGH (ref 3–11)
BILIRUBIN TOTAL: 0.2 mg/dL (ref 0.20–1.20)
BUN: 6.3 mg/dL — ABNORMAL LOW (ref 7.0–26.0)
CO2: 24 mEq/L (ref 22–29)
CREATININE: 0.8 mg/dL (ref 0.6–1.1)
Calcium: 9.9 mg/dL (ref 8.4–10.4)
Chloride: 108 mEq/L (ref 98–109)
Glucose: 145 mg/dl — ABNORMAL HIGH (ref 70–140)
Potassium: 3.7 mEq/L (ref 3.5–5.1)
Sodium: 144 mEq/L (ref 136–145)
Total Protein: 7.4 g/dL (ref 6.4–8.3)

## 2013-04-20 MED ORDER — ONDANSETRON 8 MG/NS 50 ML IVPB
INTRAVENOUS | Status: AC
  Filled 2013-04-20: qty 8

## 2013-04-20 MED ORDER — HEPARIN SOD (PORK) LOCK FLUSH 100 UNIT/ML IV SOLN
500.0000 [IU] | Freq: Once | INTRAVENOUS | Status: AC | PRN
  Administered 2013-04-20: 500 [IU]
  Filled 2013-04-20: qty 5

## 2013-04-20 MED ORDER — DEXAMETHASONE SODIUM PHOSPHATE 10 MG/ML IJ SOLN
INTRAMUSCULAR | Status: AC
  Filled 2013-04-20: qty 1

## 2013-04-20 MED ORDER — DEXAMETHASONE SODIUM PHOSPHATE 10 MG/ML IJ SOLN
10.0000 mg | Freq: Once | INTRAMUSCULAR | Status: AC
  Administered 2013-04-20: 10 mg via INTRAVENOUS

## 2013-04-20 MED ORDER — LISINOPRIL 20 MG PO TABS: ORAL_TABLET | ORAL | Status: DC

## 2013-04-20 MED ORDER — SODIUM CHLORIDE 0.9 % IV SOLN
500.0000 mg/m2 | Freq: Once | INTRAVENOUS | Status: AC
  Administered 2013-04-20: 900 mg via INTRAVENOUS
  Filled 2013-04-20: qty 36

## 2013-04-20 MED ORDER — SODIUM CHLORIDE 0.9 % IJ SOLN
10.0000 mL | INTRAMUSCULAR | Status: DC | PRN
  Administered 2013-04-20: 10 mL
  Filled 2013-04-20: qty 10

## 2013-04-20 MED ORDER — FLUCONAZOLE 100 MG PO TABS: ORAL_TABLET | ORAL | Status: DC

## 2013-04-20 MED ORDER — SODIUM CHLORIDE 0.9 % IV SOLN
Freq: Once | INTRAVENOUS | Status: AC
  Administered 2013-04-20: 10:00:00 via INTRAVENOUS

## 2013-04-20 MED ORDER — ONDANSETRON 8 MG/50ML IVPB (CHCC)
8.0000 mg | Freq: Once | INTRAVENOUS | Status: AC
  Administered 2013-04-20: 8 mg via INTRAVENOUS

## 2013-04-20 MED ORDER — ONDANSETRON HCL 8 MG PO TABS: ORAL_TABLET | ORAL | Status: DC

## 2013-04-20 NOTE — Telephone Encounter (Signed)
Patient states she is currently taking Lisinopril 20 mg daily but BP remains elevated.  [place] is concerned because she has had a mini stroke previously.  Patient is advised to take Lisinopril 40 mg daily.  New Rx was sent to pharmacy as requested by patient.  Patient already has lab appt scheduled on 05/06/13 for upcoming CPE and CPE scheduled on May 7. Are those appts okay to take care of this issue or does she need labs and follow up prior to that?  Please be sure the labs on 05/06/13 include the BMET.

## 2013-04-20 NOTE — Telephone Encounter (Signed)
Please clarify with pt... Lisinopril 20 mg is new and her BP remains elevated despite being on this med for 1 week?   If this is true, we will need to increase lisinopril to 40 mg daily. She will  alsoneed BMET 2 weeks out from starting lisinopril.  She needs to make appt to follow up BP as well 2 weeks after increasing.  If the history is something different, let me know.

## 2013-04-20 NOTE — Patient Instructions (Signed)
[facility] Discharge Instructions for Patients Receiving Chemotherapy  Today you received the following chemotherapy agents Alimta To help prevent nausea and vomiting after your treatment, we encourage you to take your nausea medication as needed   If you develop nausea and vomiting that is not controlled by your nausea medication, call the clinic.   BELOW ARE SYMPTOMS THAT SHOULD BE REPORTED IMMEDIATELY:  *FEVER GREATER THAN 100.5 F  *CHILLS WITH OR WITHOUT FEVER  NAUSEA AND VOMITING THAT IS NOT CONTROLLED WITH YOUR NAUSEA MEDICATION  *UNUSUAL SHORTNESS OF BREATH  *UNUSUAL BRUISING OR BLEEDING  TENDERNESS IN MOUTH AND THROAT WITH OR WITHOUT PRESENCE OF ULCERS  *URINARY PROBLEMS  *BOWEL PROBLEMS  UNUSUAL RASH Items with * indicate a potential emergency and should be followed up as soon as possible.  Feel free to call the clinic you have any questions or concerns. The clinic phone number is [phone].

## 2013-04-20 NOTE — Telephone Encounter (Signed)
gv adn printed appt sched na davs for pt for May

## 2013-04-20 NOTE — Progress Notes (Addendum)
[facility] [phone]   [phone]  SHARED VISIT PROGRESS NOTE  [name], MD [address] [name] [address]  Diagnosis:  Metastatic non-small cell lung cancer initially diagnosed with locally advanced stage IIIB with right Pancoast tumor involving the vertebral body as well as the foraminal canal invasion with spinal cord compression in October of 2007.   Prior Therapy:  1. Status post concurrent chemoradiation with weekly carboplatin and paclitaxel, last dose was given November 18, 2005. 2. 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 3. Status post gamma knife radiotherapy to a solitary brain lesion located in the superior frontal area of the brain at [facility] in April of 2008. 4. Status post palliative radiotherapy to the lateral abdominal wall metastatic lesion under the care of Dr. Lisbeth Renshaw, completed March of 2009. 5. Status post 6 cycles of systemic chemotherapy with carboplatin and Alimta. Last dose was given July 26, 2007 with disease stabilization. 6. Gamma knife stereotactic radiotherapy to 2 brain lesions one involving the right frontal dural based as well as right parietal lesion performed on 05/07/2012 under the care of Dr. Lilyan Punt at [facility].  Current Therapy:  Maintenance chemotherapy with Alimta 500 mg per meter squared given every 3 weeks. The patient is status post 97 cycles.   CHEMOTHERAPY INTENT: Palliative/maintenance  CURRENT # OF CHEMOTHERAPY CYCLES: 98 CURRENT ANTIEMETICS: Zofran, dexamethasone and Compazine  CURRENT SMOKING STATUS: Quit smoking 10/07/2005  ORAL CHEMOTHERAPY AND CONSENT: None  CURRENT BISPHOSPHONATES USE: None  PAIN MANAGEMENT: 0/10 currently on morphine  NARCOTICS INDUCED CONSTIPATION: No constipation  LIVING WILL AND CODE STATUS: No CODE BLUE   INTERVAL  HISTORY: Danielle [name] 63 y.o. female returns to the clinic today for followup visit accompanied by her sister. The patient is feeling fine today with no specific complaints. She is tolerating her maintenance treatment with single agent Alimta fairly well with no significant adverse effects. She reports she was evaluated by Dr. Vallarie Mare at [facility] on 04/12/2013. Over the previous week and on Saturday and Sunday her family noticed that she was off balance and "talking funny". Her MRI of the brain did not reveal any brain metastasis but there was an area of concern that Dr. Vallarie Mare felt represented a "mini stroke". The MRI results revealed a new enhancing lesion in the left cerebellar hemisphere that could represent metastasis but the shape was somewhat atypical. A subacute infarct could be a possibility given the evidence of other prior lacunar infarcts. Is recommended that a followup imaging be done in 4-6 weeks to differentiate. Patient states she is to followup with Dr. Vallarie Mare on 05/26/2013 with another MRI. Of note the symptoms of feeling off balance and would have her speech differences she had has completely resolved and have not reoccurred She reports she's had some issues with her blood pressure being high and her primary care physician took her off of the amlodipine and put her on lisinopril at 20 mg by mouth daily. She requests a refill for her Zofran tablets. She also is concerned about persistent thrush. The patient denied having any chest pain, shortness of breath, cough or hemoptysis. She denied having any fever or chills, no nausea or vomiting. The patient denied having any weight loss or night sweats.  She presents to proceed with cycle #98 of her maintenance chemotherapy with single agent Alimta. She also recently  had a restaging CT scan her chest, abdomen and pelvis and presents to discuss the results.   MEDICAL HISTORY: Past Medical History  Diagnosis Date  . Hypertension   . Anemia    . H/O: pneumonia   . History of tobacco abuse quit 10/08    on nicotine patch  . Fibromyalgia   . Hypokalemia   . DJD (degenerative joint disease), cervical   . Thrush 12/11/2010  . 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 had chemo and radiation for lung ca    ALLERGIES:  is allergic to contrast media; co-trimoxazole injection; iohexol; and sulfa drugs cross reactors.  MEDICATIONS:  Current Outpatient Prescriptions  Medication Sig Dispense Refill  . ALPRAZolam (XANAX) 0.25 MG tablet Take 1 tablet (0.25 mg total) by mouth at bedtime as needed for sleep.  30 tablet  0  . amLODipine (NORVASC) 10 MG tablet Take 10 mg by mouth.      Marland Kitchen antipyrine-benzocaine (AURALGAN) otic solution Place 1 drop into both ears daily.  10 mL  1  . fluconazole (DIFLUCAN) 100 MG tablet Take 2 tablets by mouth on day one, then take 1 tablet by mouth daily until completed.Do not take simvastatin while taking fluconazole  16 tablet  0  . fluticasone (FLONASE) 50 MCG/ACT nasal spray Place 1-2 sprays into the nose daily.  16 g  11  . folic acid (FOLVITE) 1 MG tablet TAKE 1 TABLET BY MOUTH DAILY.  90 tablet  3  . lidocaine-prilocaine (EMLA) cream Apply topically as needed.  30 g  1  . lisinopril (PRINIVIL,ZESTRIL) 20 MG tablet Take 20 mg by mouth daily.      . meclizine (ANTIVERT) 12.5 MG tablet Take 1-2 tablets (12.5-25 mg total) by mouth 3 (three) times daily as needed.  30 tablet  1  . morphine (MS CONTIN) 60 MG 12 hr tablet Take 1 tablet (60 mg total) by mouth 2 (two) times daily.  60 tablet  0  . morphine (MSIR) 30 MG tablet Take 1 tablet (30 mg total) by mouth every 4 (four) hours as needed.  60 tablet  0  . olopatadine (PATANOL) 0.1 % ophthalmic solution Place 1 drop into both eyes 2 (two) times daily.  5 mL  11  . ondansetron (ZOFRAN) 8 MG tablet TAKE 1 TABLET (8 MG TOTAL) BY MOUTH EVERY 8 (EIGHT) HOURS AS NEEDED FOR NAUSEA.  30 tablet  2  . predniSONE (DELTASONE) 50 MG tablet 13  hrs, 7 hrs, and 1 hr before scheduled CT scans       No current facility-administered medications for this visit.    SURGICAL HISTORY:  Past Surgical History  Procedure Laterality Date  . Cervical laminectomy  1995  . Other surgical history  2008    Gamma knife surgery to remove brain met   . 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 Knee surgery  1990    Left x 2  . Portacath placement  -[name]    TIP IN CAVOATRIAL JUNCTION    REVIEW OF SYSTEMS:  Constitutional: negative Eyes: negative Ears, nose, mouth, throat, and face: positive for thrush Respiratory: negative Cardiovascular: negative Gastrointestinal: negative Genitourinary:negative Integument/breast: negative Hematologic/lymphatic: negative Musculoskeletal:negative Neurological: positive for gait problems, speech problems and Transient, occurring over 1-2 days and then no further episodes Behavioral/Psych: negative Endocrine: negative Allergic/Immunologic: negative   PHYSICAL EXAMINATION: General appearance: alert, cooperative and no distress Head:  Normocephalic, without obvious abnormality, atraumatic Neck: no adenopathy, no JVD, supple, symmetrical, trachea midline and thyroid not enlarged, symmetric, no tenderness/mass/nodules Lymph nodes: Cervical, supraclavicular, and axillary nodes normal. Resp: clear to auscultation bilaterally Back: symmetric, no curvature. ROM normal. No CVA tenderness. Cardio: regular rate and rhythm, S1, S2 normal, no murmur, click, rub or gallop GI: soft, non-tender; bowel sounds normal; no masses,  no organomegaly Extremities: extremities normal, atraumatic, no cyanosis or edema Neurologic: Alert and oriented X 3, normal strength and tone. Normal symmetric reflexes. Normal coordination and gait Mouth: reveals persistent thrush, primarily on tongue . Buccal mucosa minimally affected  ECOG PERFORMANCE STATUS: 0 - Asymptomatic  Blood pressure 159/85, pulse  94, temperature 97.9 F (36.6 C), temperature source Oral, resp. rate 18, height '5\' 3"'  (1.6 m), weight 161 lb 4.8 oz (73.165 kg), SpO2 100.00%.  LABORATORY DATA: Lab Results  Component Value Date   WBC 7.8 04/20/2013   HGB 12.9 04/20/2013   HCT 39.2 04/20/2013   MCV 91.6 04/20/2013   PLT 454* 04/20/2013      Chemistry      Component Value Date/Time   NA 144 04/20/2013 0849   NA 138 02/05/2013 1026   K 3.7 04/20/2013 0849   K 4.3 02/05/2013 1026   CL 105 02/05/2013 1026   CL 106 06/30/2012 1004   CO2 24 04/20/2013 0849   CO2 27 02/05/2013 1026   BUN 6.3* 04/20/2013 0849   BUN 9 02/05/2013 1026   CREATININE 0.8 04/20/2013 0849   CREATININE 0.6 02/05/2013 1026      Component Value Date/Time   CALCIUM 9.9 04/20/2013 0849   CALCIUM 9.2 02/05/2013 1026   ALKPHOS 81 04/20/2013 0849   ALKPHOS 75 02/05/2013 1026   AST 14 04/20/2013 0849   AST 17 02/05/2013 1026   ALT <6 04/20/2013 0849   ALT 9 02/05/2013 1026   BILITOT 0.20 04/20/2013 0849   BILITOT 0.4 02/05/2013 1026       RADIOGRAPHIC STUDIES: Ct Chest W Contrast  04/16/2013   CLINICAL DATA:  Restaging non small cell lung cancer. History of brain metastases.  EXAM: CT CHEST, ABDOMEN, AND PELVIS WITH CONTRAST  TECHNIQUE: Multidetector CT imaging of the chest, abdomen and pelvis was performed following the standard protocol during bolus administration of intravenous contrast.  CONTRAST:  177m OMNIPAQUE IOHEXOL 300 MG/ML  SOLN  COMPARISON:  CT ABD/PELVIS W CM dated 02/12/2013; CT ABD/PELVIS W CM dated 12/11/2012  FINDINGS: CT CHEST FINDINGS  Port-A-Cath again noted in the right chest wall soft tissues with the tip near the cavoatrial junction, unchanged. Heart is normal size. Aorta is normal caliber. Scattered coronary artery calcifications, stable. Scattered aortic arch and descending thoracic aortic calcifications, stable.  No mediastinal, hilar, or axillary adenopathy. No pleural or pericardial effusion.  Moderate centrilobular emphysema changes.  Triangular-shaped subpleural nodular density noted in the anterior right upper lobe measuring 11 mm on image 18, stable. Biapical pleural/ parenchymal changes, right greater than left again noted compatible with scarring. No new or enlarging pulmonary nodules.  Chest wall soft tissues are unremarkable.  No acute bony abnormality or focal bone lesion. Changes of prior vertebroplasty at T4 and T7. Severe compression fracture with vertebra plana at T3, stable.  CT ABDOMEN AND PELVIS FINDINGS  Liver, gallbladder, spleen, pancreas, adrenals and kidneys are normal. Stomach, large and small bowel unremarkable. Uterus, adnexae and urinary bladder have a normal appearance. No free fluid, free air or adenopathy.  Diffuse aortic and iliac atherosclerosis without aneurysm.  No  acute bony abnormality or focal bone lesion. Degenerative changes in the lower lumbar spine. Sclerotic foci in the femoral heads and bilateral ischia are stable and likely reflect benign processes such as bone islands.  IMPRESSION: No evidence of recurrent or metastatic disease in the chest, abdomen or pelvis.  COPD.  Coronary artery disease.  Stable apical pleural thickening and subpleural triangular nodular density in the right upper lobe.  No acute findings.   Electronically Signed   By: Rolm Baptise M.D.   On: 04/16/2013 08:16   Ct Abdomen Pelvis W Contrast  04/16/2013   CLINICAL DATA:  Restaging non small cell lung cancer. History of brain metastases.  EXAM: CT CHEST, ABDOMEN, AND PELVIS WITH CONTRAST  TECHNIQUE: Multidetector CT imaging of the chest, abdomen and pelvis was performed following the standard protocol during bolus administration of intravenous contrast.  CONTRAST:  178m OMNIPAQUE IOHEXOL 300 MG/ML  SOLN  COMPARISON:  CT ABD/PELVIS W CM dated 02/12/2013; CT ABD/PELVIS W CM dated 12/11/2012  FINDINGS: CT CHEST FINDINGS  Port-A-Cath again noted in the right chest wall soft tissues with the tip near the cavoatrial junction, unchanged.  Heart is normal size. Aorta is normal caliber. Scattered coronary artery calcifications, stable. Scattered aortic arch and descending thoracic aortic calcifications, stable.  No mediastinal, hilar, or axillary adenopathy. No pleural or pericardial effusion.  Moderate centrilobular emphysema changes. Triangular-shaped subpleural nodular density noted in the anterior right upper lobe measuring 11 mm on image 18, stable. Biapical pleural/ parenchymal changes, right greater than left again noted compatible with scarring. No new or enlarging pulmonary nodules.  Chest wall soft tissues are unremarkable.  No acute bony abnormality or focal bone lesion. Changes of prior vertebroplasty at T4 and T7. Severe compression fracture with vertebra plana at T3, stable.  CT ABDOMEN AND PELVIS FINDINGS  Liver, gallbladder, spleen, pancreas, adrenals and kidneys are normal. Stomach, large and small bowel unremarkable. Uterus, adnexae and urinary bladder have a normal appearance. No free fluid, free air or adenopathy.  Diffuse aortic and iliac atherosclerosis without aneurysm.  No acute bony abnormality or focal bone lesion. Degenerative changes in the lower lumbar spine. Sclerotic foci in the femoral heads and bilateral ischia are stable and likely reflect benign processes such as bone islands.  IMPRESSION: No evidence of recurrent or metastatic disease in the chest, abdomen or pelvis.  COPD.  Coronary artery disease.  Stable apical pleural thickening and subpleural triangular nodular density in the right upper lobe.  No acute findings.   Electronically Signed   By: KRolm BaptiseM.D.   On: 04/16/2013 08:16    ASSESSMENT AND PLAN: This is a very pleasant [age]years old white female with metastatic non-small cell lung cancer currently undergoing maintenance chemotherapy with single agent Alimta status post 97 cycles. The patient is tolerating her treatment fairly well with no significant adverse effects. Patient was discussed with and  also seen by Dr. MJulien Nordmann She is encouraged to followup with Dr. CVallarie Mareas previously scheduled to followup the new area in her brain. She is further encouraged to followup with her primary care physician regarding her blood pressure control and medications. Her recent CT scan showed no evidence for disease progression in the chest, abdomen and pelvis. These results were reviewed with the patient and her sister. She'll proceed with cycle #98 of her maintenance chemotherapy with single agent Alimta today as scheduled. A refill prescription for Zofran tablets was sent to her pharmacy is of record as well as a refill prescription  for Diflucan to deal with the oral candidiasis. She is given a refill prescription She will  continue on maintenance and Alimta as scheduled.  She will followup in 3 weeks prior to cycle #99.   She was advised to call immediately if she has any concerning symptoms in the interval. The patient voices understanding of current disease status and treatment options and is in agreement with the current care plan.  All questions were answered. The patient knows to call the clinic with any problems, questions or concerns. We can certainly see the patient much sooner if necessary.  Carlton Adam, PA-C  ADDENDUM: Hematology/Oncology Attending: I had a face to face encounter with the patient. I recommended her care plan. This is a very pleasant 63 years old white female with metastatic non-small cell lung cancer currently undergoing maintenance chemotherapy with single agent Alimta status post 97 cycles. The patient is tolerating her treatment fairly well with no significant adverse effects except for occasional nausea. She had a recent episode of TIA and was seen by Dr. Vallarie Mare at [facility] but this resolved spontaneously. Her recent CT scan of the chest, abdomen and pelvis showed no evidence for disease progression. I discussed the scan results with the patient and her sister. I  recommended for her to continue her maintenance treatment as scheduled. She would come back for followup visit in 3 weeks with the start of cycle number 99. She was advised to call immediately if she has any concerning symptoms in the interval.  Disclaimer: This note was dictated with voice recognition software. Similar sounding words can inadvertently be transcribed and may not be corrected upon review. Curt Bears, MD 04/21/2013

## 2013-04-20 NOTE — Telephone Encounter (Signed)
Those appts are good.

## 2013-04-20 NOTE — Progress Notes (Signed)
04/20/13 @ 8:50 am, BMS [id number], Research Labs and PROs:  Danielle [name] into the [facility] for labs, to see [name], PA and receive Alimta maintenance therapy.  She completed the LCSS and EQ-5D-3L PROs.  She had research labs drawn from her arm with standard of care labs.  Thanked her for her continued support of the study.

## 2013-04-20 NOTE — Telephone Encounter (Signed)
Patient Information:  Caller Name: Danielle [name]  Phone: [phone]  Patient: Danielle [name]  Gender: Female  DOB: 1950-03-22  Age: 63 Years  PCP: Danielle [name] (Family Practice)  Office Follow Up:  Does the office need to follow up with this patient?: Yes  Instructions For The Office: Was told to call her PCP by oncology staff regarding her b/p and she also needs a new script for the Lisinipril 20mg  tabs.   Symptoms  Reason For Call & Symptoms: Patient calling, had a stroke the weekend of Easter.  Dx on 4/6 with MRI.  Today her b/p is 159/85 and she is concerned.  She has "terminal lung cancer" with brain mets.  No new medication given.  Denies a h/a.  Had chemotherapy today and the RN in oncology was concerned about her b/p.  She also needs a new script for the Lisinopril 20mg  po daily., asking for 90 day supply please.  Reviewed Health History In EMR: Yes  Reviewed Medications In EMR: Yes  Reviewed Allergies In EMR: Yes  Reviewed Surgeries / Procedures: Yes  Date of Onset of Symptoms: 04/20/2013  Treatments Tried: Lisinopril 20mg  po daily  Treatments Tried Worked: Yes  Guideline(s) Used:  No Protocol Available - Information Only  Disposition Per Guideline:   Discuss with PCP and Callback by Nurse within 1 Hour  Reason For Disposition Reached:   Nursing judgment  Advice Given:  N/A  Patient Will Follow Care Advice:  YES

## 2013-04-21 NOTE — Patient Instructions (Signed)
Your CT scans showed no evidence for disease progression Contact your primary care physician regarding your blood pressure control and blood pressure medications Lobe with Dr. Vallarie Mare as scheduled to reevaluate the new area in your brain Followup in 3 weeks

## 2013-05-03 ENCOUNTER — Other Ambulatory Visit: Payer: Self-pay | Admitting: Medical Oncology

## 2013-05-03 DIAGNOSIS — C3490 Malignant neoplasm of unspecified part of unspecified bronchus or lung: Secondary | ICD-10-CM

## 2013-05-03 MED ORDER — MORPHINE SULFATE ER 60 MG PO TBCR: 60.0000 mg | EXTENDED_RELEASE_TABLET | Freq: Two times a day (BID) | ORAL | Status: DC

## 2013-05-03 MED ORDER — MORPHINE SULFATE 30 MG PO TABS: 30.0000 mg | ORAL_TABLET | ORAL | Status: DC | PRN

## 2013-05-03 MED ORDER — ALPRAZOLAM 0.25 MG PO TABS: 0.2500 mg | ORAL_TABLET | Freq: Every evening | ORAL | Status: DC | PRN

## 2013-05-03 NOTE — Telephone Encounter (Signed)
rx locked up for pick up.

## 2013-05-06 ENCOUNTER — Other Ambulatory Visit (INDEPENDENT_AMBULATORY_CARE_PROVIDER_SITE_OTHER): Payer: Medicare Other

## 2013-05-06 DIAGNOSIS — E7800 Pure hypercholesterolemia, unspecified: Secondary | ICD-10-CM

## 2013-05-06 LAB — LIPID PANEL
CHOL/HDL RATIO: 9
Cholesterol: 227 mg/dL — ABNORMAL HIGH (ref 0–200)
HDL: 25 mg/dL — AB (ref >39.00)
LDL Cholesterol: 163 mg/dL — ABNORMAL HIGH (ref 0–99)
Triglycerides: 195 mg/dL — ABNORMAL HIGH (ref 0.0–149.0)
VLDL: 39 mg/dL (ref 0.0–40.0)

## 2013-05-11 ENCOUNTER — Telehealth: Payer: Self-pay | Admitting: *Deleted

## 2013-05-11 ENCOUNTER — Telehealth: Payer: Self-pay | Admitting: Internal Medicine

## 2013-05-11 ENCOUNTER — Encounter: Payer: Self-pay | Admitting: *Deleted

## 2013-05-11 ENCOUNTER — Ambulatory Visit (HOSPITAL_BASED_OUTPATIENT_CLINIC_OR_DEPARTMENT_OTHER): Payer: Medicare Other | Admitting: Internal Medicine

## 2013-05-11 ENCOUNTER — Ambulatory Visit (HOSPITAL_BASED_OUTPATIENT_CLINIC_OR_DEPARTMENT_OTHER): Payer: Medicare Other

## 2013-05-11 ENCOUNTER — Other Ambulatory Visit: Payer: Medicare Other

## 2013-05-11 ENCOUNTER — Other Ambulatory Visit (HOSPITAL_BASED_OUTPATIENT_CLINIC_OR_DEPARTMENT_OTHER): Payer: Medicare Other

## 2013-05-11 ENCOUNTER — Ambulatory Visit: Payer: Medicare Other

## 2013-05-11 ENCOUNTER — Encounter: Payer: Self-pay | Admitting: Internal Medicine

## 2013-05-11 VITALS — BP 191/96 | HR 85 | Temp 98.5°F | Resp 18 | Ht 63.0 in | Wt 161.9 lb

## 2013-05-11 VITALS — BP 149/70 | HR 70

## 2013-05-11 DIAGNOSIS — Z5111 Encounter for antineoplastic chemotherapy: Secondary | ICD-10-CM | POA: Diagnosis not present

## 2013-05-11 DIAGNOSIS — C7952 Secondary malignant neoplasm of bone marrow: Secondary | ICD-10-CM

## 2013-05-11 DIAGNOSIS — C3490 Malignant neoplasm of unspecified part of unspecified bronchus or lung: Secondary | ICD-10-CM

## 2013-05-11 DIAGNOSIS — C7951 Secondary malignant neoplasm of bone: Secondary | ICD-10-CM

## 2013-05-11 DIAGNOSIS — C7931 Secondary malignant neoplasm of brain: Secondary | ICD-10-CM

## 2013-05-11 DIAGNOSIS — C3410 Malignant neoplasm of upper lobe, unspecified bronchus or lung: Secondary | ICD-10-CM

## 2013-05-11 DIAGNOSIS — C7949 Secondary malignant neoplasm of other parts of nervous system: Secondary | ICD-10-CM

## 2013-05-11 LAB — COMPREHENSIVE METABOLIC PANEL (CC13)
AST: 14 U/L (ref 5–34)
Albumin: 3.2 g/dL — ABNORMAL LOW (ref 3.5–5.0)
Alkaline Phosphatase: 86 U/L (ref 40–150)
Anion Gap: 11 mEq/L (ref 3–11)
BILIRUBIN TOTAL: 0.22 mg/dL (ref 0.20–1.20)
BUN: 5.1 mg/dL — ABNORMAL LOW (ref 7.0–26.0)
CHLORIDE: 108 meq/L (ref 98–109)
CO2: 25 mEq/L (ref 22–29)
CREATININE: 0.8 mg/dL (ref 0.6–1.1)
Calcium: 9.9 mg/dL (ref 8.4–10.4)
Glucose: 117 mg/dl (ref 70–140)
Potassium: 4.2 mEq/L (ref 3.5–5.1)
Sodium: 143 mEq/L (ref 136–145)
Total Protein: 6.7 g/dL (ref 6.4–8.3)

## 2013-05-11 LAB — CBC WITH DIFFERENTIAL/PLATELET
BASO%: 0.7 % (ref 0.0–2.0)
Basophils Absolute: 0 10*3/uL (ref 0.0–0.1)
EOS%: 1 % (ref 0.0–7.0)
Eosinophils Absolute: 0.1 10*3/uL (ref 0.0–0.5)
HCT: 37.2 % (ref 34.8–46.6)
HGB: 12.3 g/dL (ref 11.6–15.9)
LYMPH#: 1.5 10*3/uL (ref 0.9–3.3)
LYMPH%: 27.7 % (ref 14.0–49.7)
MCH: 30.1 pg (ref 25.1–34.0)
MCHC: 33 g/dL (ref 31.5–36.0)
MCV: 91.4 fL (ref 79.5–101.0)
MONO#: 0.8 10*3/uL (ref 0.1–0.9)
MONO%: 15 % — ABNORMAL HIGH (ref 0.0–14.0)
NEUT#: 3 10*3/uL (ref 1.5–6.5)
NEUT%: 55.6 % (ref 38.4–76.8)
Platelets: 329 10*3/uL (ref 145–400)
RBC: 4.07 10*6/uL (ref 3.70–5.45)
RDW: 14.7 % — ABNORMAL HIGH (ref 11.2–14.5)
WBC: 5.4 10*3/uL (ref 3.9–10.3)

## 2013-05-11 MED ORDER — SODIUM CHLORIDE 0.9 % IJ SOLN
10.0000 mL | INTRAMUSCULAR | Status: DC | PRN
  Administered 2013-05-11: 10 mL
  Filled 2013-05-11: qty 10

## 2013-05-11 MED ORDER — CLONIDINE HCL 0.1 MG PO TABS
ORAL_TABLET | ORAL | Status: AC
  Filled 2013-05-11: qty 1

## 2013-05-11 MED ORDER — HEPARIN SOD (PORK) LOCK FLUSH 100 UNIT/ML IV SOLN
500.0000 [IU] | Freq: Once | INTRAVENOUS | Status: AC | PRN
  Administered 2013-05-11: 500 [IU]
  Filled 2013-05-11: qty 5

## 2013-05-11 MED ORDER — SODIUM CHLORIDE 0.9 % IV SOLN
500.0000 mg/m2 | Freq: Once | INTRAVENOUS | Status: AC
  Administered 2013-05-11: 900 mg via INTRAVENOUS
  Filled 2013-05-11: qty 36

## 2013-05-11 MED ORDER — ONDANSETRON 8 MG/50ML IVPB (CHCC)
8.0000 mg | Freq: Once | INTRAVENOUS | Status: AC
Start: 2013-05-11 — End: 2013-05-11
  Administered 2013-05-11: 8 mg via INTRAVENOUS

## 2013-05-11 MED ORDER — CLONIDINE HCL 0.1 MG PO TABS
0.1000 mg | ORAL_TABLET | ORAL | Status: DC
  Administered 2013-05-11: 0.1 mg via ORAL

## 2013-05-11 MED ORDER — SODIUM CHLORIDE 0.9 % IV SOLN
Freq: Once | INTRAVENOUS | Status: AC
  Administered 2013-05-11: 10:00:00 via INTRAVENOUS

## 2013-05-11 MED ORDER — DEXAMETHASONE SODIUM PHOSPHATE 10 MG/ML IJ SOLN
INTRAMUSCULAR | Status: AC
  Filled 2013-05-11: qty 1

## 2013-05-11 MED ORDER — CYANOCOBALAMIN 1000 MCG/ML IJ SOLN
1000.0000 ug | Freq: Once | INTRAMUSCULAR | Status: AC
  Administered 2013-05-11: 1000 ug via INTRAMUSCULAR

## 2013-05-11 MED ORDER — DEXAMETHASONE SODIUM PHOSPHATE 10 MG/ML IJ SOLN
10.0000 mg | Freq: Once | INTRAMUSCULAR | Status: AC
  Administered 2013-05-11: 10 mg via INTRAVENOUS

## 2013-05-11 MED ORDER — LIDOCAINE-PRILOCAINE 2.5-2.5 % EX CREA: TOPICAL_CREAM | CUTANEOUS | Status: AC | PRN

## 2013-05-11 MED ORDER — ONDANSETRON 8 MG/NS 50 ML IVPB
INTRAVENOUS | Status: AC
  Filled 2013-05-11: qty 8

## 2013-05-11 MED ORDER — CYANOCOBALAMIN 1000 MCG/ML IJ SOLN
INTRAMUSCULAR | Status: AC
  Filled 2013-05-11: qty 1

## 2013-05-11 NOTE — Progress Notes (Signed)
[name] [phone]   [phone]  OFFICE PROGRESS NOTE  [name], MD [name]. [name] [address]  Diagnosis:  Metastatic non-small cell lung cancer initially diagnosed with locally advanced stage IIIB with right Pancoast tumor involving the vertebral body as well as the foraminal canal invasion with spinal cord compression in October of 2007.   Prior Therapy:  1. Status post concurrent chemoradiation with weekly carboplatin and paclitaxel, last dose was given November 18, 2005. 2. 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 3. Status post gamma knife radiotherapy to a solitary brain lesion located in the superior frontal area of the brain at [facility] in April of 2008. 4. Status post palliative radiotherapy to the lateral abdominal wall metastatic lesion under the care of Dr. Lisbeth Renshaw, completed March of 2009. 5. Status post 6 cycles of systemic chemotherapy with carboplatin and Alimta. Last dose was given July 26, 2007 with disease stabilization. 6. Gamma knife stereotactic radiotherapy to 2 brain lesions one involving the right frontal dural based as well as right parietal lesion performed on 05/07/2012 under the care of Dr. Lilyan Punt at [facility].  Current Therapy:  Maintenance chemotherapy with Alimta 500 mg per meter squared given every 3 weeks. The patient is status post 98 cycles.   CHEMOTHERAPY INTENT: Palliative/maintenance  CURRENT # OF CHEMOTHERAPY CYCLES: 99 CURRENT ANTIEMETICS: Zofran, dexamethasone and Compazine  CURRENT SMOKING STATUS: Quit smoking 10/07/2005  ORAL CHEMOTHERAPY AND CONSENT: None  CURRENT BISPHOSPHONATES USE: None  PAIN MANAGEMENT: 0/10 currently on morphine  NARCOTICS INDUCED CONSTIPATION: No constipation  LIVING WILL AND CODE STATUS: No CODE BLUE   INTERVAL  HISTORY: Danielle [name] 63 y.o. female returns to the clinic today for followup visit accompanied by her sister. The patient is feeling fine today with no specific complaints. She is tolerating her maintenance treatment with single agent Alimta fairly well with no significant adverse effects. The patient denied having any chest pain, shortness of breath, cough or hemoptysis. She denied having any fever or chills, no nausea or vomiting. The patient denied having any weight loss or night sweats. Her blood pressure medication has changed recently but the patient did cut the dose in half because she was getting dizzy. She is here today to start cycle #99 of her maintenance treatment.  MEDICAL HISTORY: Past Medical History  Diagnosis Date  . Hypertension   . Anemia   . H/O: pneumonia   . History of tobacco abuse quit 10/08    on nicotine patch  . Fibromyalgia   . Hypokalemia   . DJD (degenerative joint disease), cervical   . Thrush 12/11/2010  . 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 had chemo and radiation for lung ca    ALLERGIES:  is allergic to contrast media; co-trimoxazole injection; iohexol; and sulfa drugs cross reactors.  MEDICATIONS:  Current Outpatient Prescriptions  Medication Sig Dispense Refill  . ALPRAZolam (XANAX) 0.25 MG tablet Take 1 tablet (0.25 mg total) by mouth at bedtime as needed for sleep.  30 tablet  0  . amLODipine (NORVASC) 10 MG tablet Take 10 mg by mouth.      Marland Kitchen antipyrine-benzocaine (AURALGAN) otic solution Place 1 drop into both ears daily.  10 mL  1  . fluconazole (DIFLUCAN) 100 MG tablet Take 2 tablets by mouth on day one,  then take 1 tablet by mouth daily until completed.Do not take simvastatin while taking fluconazole  16 tablet  0  . fluticasone (FLONASE) 50 MCG/ACT nasal spray Place 1-2 sprays into the nose daily.  16 g  11  . folic acid (FOLVITE) 1 MG tablet TAKE 1 TABLET BY MOUTH DAILY.  90 tablet  3  . lisinopril  (PRINIVIL,ZESTRIL) 20 MG tablet Take 2 tablets (40 mg total) by mouth daily.  60 tablet  1  . meclizine (ANTIVERT) 12.5 MG tablet Take 1-2 tablets (12.5-25 mg total) by mouth 3 (three) times daily as needed.  30 tablet  1  . morphine (MS CONTIN) 60 MG 12 hr tablet Take 1 tablet (60 mg total) by mouth 2 (two) times daily.  60 tablet  0  . morphine (MSIR) 30 MG tablet Take 1 tablet (30 mg total) by mouth every 4 (four) hours as needed.  60 tablet  0  . olopatadine (PATANOL) 0.1 % ophthalmic solution Place 1 drop into both eyes 2 (two) times daily.  5 mL  11  . ondansetron (ZOFRAN) 8 MG tablet TAKE 1 TABLET (8 MG TOTAL) BY MOUTH EVERY 8 (EIGHT) HOURS AS NEEDED FOR NAUSEA.  30 tablet  2  . predniSONE (DELTASONE) 50 MG tablet 13 hrs, 7 hrs, and 1 hr before scheduled CT scans       No current facility-administered medications for this visit.    SURGICAL HISTORY:  Past Surgical History  Procedure Laterality Date  . Cervical laminectomy  1995  . Other surgical history  2008    Gamma knife surgery to remove brain met   . 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 Knee surgery  1990    Left x 2  . Portacath placement  -[name]    TIP IN CAVOATRIAL JUNCTION    REVIEW OF SYSTEMS:  Constitutional: negative Eyes: negative Ears, nose, mouth, throat, and face: negative Respiratory: negative Cardiovascular: negative Gastrointestinal: negative Genitourinary:negative Integument/breast: negative Hematologic/lymphatic: negative Musculoskeletal:negative Neurological: negative Behavioral/Psych: negative Endocrine: negative Allergic/Immunologic: negative   PHYSICAL EXAMINATION: General appearance: alert, cooperative and no distress Head: Normocephalic, without obvious abnormality, atraumatic Neck: no adenopathy, no JVD, supple, symmetrical, trachea midline and thyroid not enlarged, symmetric, no tenderness/mass/nodules Lymph nodes: Cervical, supraclavicular, and axillary  nodes normal. Resp: clear to auscultation bilaterally Back: symmetric, no curvature. ROM normal. No CVA tenderness. Cardio: regular rate and rhythm, S1, S2 normal, no murmur, click, rub or gallop GI: soft, non-tender; bowel sounds normal; no masses,  no organomegaly Extremities: extremities normal, atraumatic, no cyanosis or edema Neurologic: Alert and oriented X 3, normal strength and tone. Normal symmetric reflexes. Normal coordination and gait  ECOG PERFORMANCE STATUS: 0 - Asymptomatic  There were no vitals taken for this visit.  LABORATORY DATA: Lab Results  Component Value Date   WBC 5.4 05/11/2013   HGB 12.3 05/11/2013   HCT 37.2 05/11/2013   MCV 91.4 05/11/2013   PLT 329 05/11/2013      Chemistry      Component Value Date/Time   NA 144 04/20/2013 0849   NA 138 02/05/2013 1026   K 3.7 04/20/2013 0849   K 4.3 02/05/2013 1026   CL 105 02/05/2013 1026   CL 106 06/30/2012 1004   CO2 24 04/20/2013 0849   CO2 27 02/05/2013 1026   BUN 6.3* 04/20/2013 0849   BUN 9 02/05/2013 1026   CREATININE 0.8 04/20/2013 0849   CREATININE 0.6 02/05/2013  1026      Component Value Date/Time   CALCIUM 9.9 04/20/2013 0849   CALCIUM 9.2 02/05/2013 1026   ALKPHOS 81 04/20/2013 0849   ALKPHOS 75 02/05/2013 1026   AST 14 04/20/2013 0849   AST 17 02/05/2013 1026   ALT <6 04/20/2013 0849   ALT 9 02/05/2013 1026   BILITOT 0.20 04/20/2013 0849   BILITOT 0.4 02/05/2013 1026       RADIOGRAPHIC STUDIES:  ASSESSMENT AND PLAN: This is a very pleasant 63 years old white female with metastatic non-small cell lung cancer currently undergoing maintenance chemotherapy with single agent Alimta status post 98 cycles. The patient is tolerating her treatment fairly well with no significant adverse effects.  I recommended for her to continue on maintenance and Alimta as scheduled. The patient will receive cycle #99 today. For pain management, she will continue on her current pain medication with MS Contin and MS IR The patient  would come back for followup visit in 3 weeks for evaluation before starting cycle #100. For hypertension, I advised the patient to contact her primary care physician again for adjustment of her medication. Her systolic blood pressure at home is in the range of 130 to 140. She was advised to call immediately if she has any concerning symptoms in the interval. The patient voices understanding of current disease status and treatment options and is in agreement with the current care plan.  All questions were answered. The patient knows to call the clinic with any problems, questions or concerns. We can certainly see the patient much sooner if necessary.  Disclaimer: This note was dictated with voice recognition software. Similar sounding words can inadvertently be transcribed and may not be corrected upon review.

## 2013-05-11 NOTE — Patient Instructions (Signed)
[facility] Discharge Instructions for Patients Receiving Chemotherapy  Today you received the following chemotherapy agents:  Alimta  To help prevent nausea and vomiting after your treatment, we encourage you to take your nausea medication as ordered per MD.   If you develop nausea and vomiting that is not controlled by your nausea medication, call the clinic.   BELOW ARE SYMPTOMS THAT SHOULD BE REPORTED IMMEDIATELY:  *FEVER GREATER THAN 100.5 F  *CHILLS WITH OR WITHOUT FEVER  NAUSEA AND VOMITING THAT IS NOT CONTROLLED WITH YOUR NAUSEA MEDICATION  *UNUSUAL SHORTNESS OF BREATH  *UNUSUAL BRUISING OR BLEEDING  TENDERNESS IN MOUTH AND THROAT WITH OR WITHOUT PRESENCE OF ULCERS  *URINARY PROBLEMS  *BOWEL PROBLEMS  UNUSUAL RASH Items with * indicate a potential emergency and should be followed up as soon as possible.  Feel free to call the clinic you have any questions or concerns. The clinic phone number is [phone].

## 2013-05-11 NOTE — Progress Notes (Signed)
Per Dr Vista Mink, BP elevated, needs to take 0.1 clonidine in the infusion room and she needs to take her BP meds when she gets home

## 2013-05-11 NOTE — Progress Notes (Signed)
05/10/13 @ 8:30 am, BMS [id number], Questionnaires Only:  Danielle [name] into the [facility] for labs, to see Dr. Julien Nordmann, and to receive treatment with Alimta.  She completed the LCSS and EQ-5D-3L PROs in the lobby prior to all other activities.  She has had no change in her treatment plan and her disease is stable.

## 2013-05-11 NOTE — Telephone Encounter (Signed)
Per staff message and POF I have scheduled appts.  I was unable to adjusted 5/26 appt, no available. Scheduler notified JMW

## 2013-05-11 NOTE — Telephone Encounter (Signed)
Gave pt appt for lab and MD, emailed Sharyn Lull regarding chemo adjust for 5/26 pt will be seeing ML per pt rqst

## 2013-05-12 ENCOUNTER — Telehealth: Payer: Self-pay | Admitting: Internal Medicine

## 2013-05-12 NOTE — Telephone Encounter (Signed)
Talked to pt, she is aware what time to get her on 5/26, emailed AJ regarding ML spot

## 2013-05-13 ENCOUNTER — Ambulatory Visit (INDEPENDENT_AMBULATORY_CARE_PROVIDER_SITE_OTHER): Payer: Medicare Other | Admitting: Family Medicine

## 2013-05-13 ENCOUNTER — Telehealth: Payer: Self-pay | Admitting: Family Medicine

## 2013-05-13 ENCOUNTER — Encounter: Payer: Self-pay | Admitting: Family Medicine

## 2013-05-13 VITALS — BP 169/89 | HR 70 | Temp 98.1°F | Ht 63.0 in | Wt 158.5 lb

## 2013-05-13 DIAGNOSIS — E7800 Pure hypercholesterolemia, unspecified: Secondary | ICD-10-CM

## 2013-05-13 DIAGNOSIS — I10 Essential (primary) hypertension: Secondary | ICD-10-CM

## 2013-05-13 DIAGNOSIS — E119 Type 2 diabetes mellitus without complications: Secondary | ICD-10-CM

## 2013-05-13 MED ORDER — AMLODIPINE BESYLATE 10 MG PO TABS
10.0000 mg | ORAL_TABLET | Freq: Every day | ORAL | Status: DC
Start: 2013-05-13 — End: 2014-05-01

## 2013-05-13 MED ORDER — SIMVASTATIN 20 MG PO TABS: 20.0000 mg | ORAL_TABLET | Freq: Every day | ORAL | Status: DC

## 2013-05-13 MED ORDER — SIMVASTATIN 40 MG PO TABS: 40.0000 mg | ORAL_TABLET | Freq: Every day | ORAL | Status: DC

## 2013-05-13 NOTE — Assessment & Plan Note (Signed)
Restart simvastatin but at lower dose  20 mg given interaction potential between amlodipine ans simvastatin.  Recheck chol in 3 months.

## 2013-05-13 NOTE — Telephone Encounter (Signed)
Relevant patient education assigned to patient using Emmi. ° °

## 2013-05-13 NOTE — Patient Instructions (Addendum)
Go back to previous BP regimen of lisinopril 20 mg daily and amlodipine 10 mg daily.  Follow BP at home, call if > 140/90 consistently.  Restart simvastatin daily but at 20 mg daily.. Follow up in 3 month for BP and chol check with fasting labs prior.

## 2013-05-13 NOTE — Progress Notes (Signed)
Pre visit review using our clinic review tool, if applicable. No additional management support is needed unless otherwise documented below in the visit note. 

## 2013-05-13 NOTE — Progress Notes (Signed)
Lung cancer, metastatic On 99 chemo cycle!!!.. MRI brain.stable 6 months ago no tumor recurrence at this time. Sees ONC regularly. Chemo recieves every 3 weeks.  CT scan of lungs and abdomen/pelvis stable at last check 3 weeks ago.  Pain well controlled with morphine..some constipation..hard stool.  Sleeping well. Using alprazolam.   Diabetes: Well controlled with diet  Lab Results  Component Value Date   HGBA1C 6.2 02/05/2013   Using medications without difficulties:  Hypoglycemic episodes: None  Hyperglycemic episodes:None  Feet problems: No ulcers  Blood Sugars averaging: not checking at home.  eye exam within last year: 01/2011   Hypertension: Controlled at home good but here elevated amlodipine, continued lisinopril  and now up to 40 mg daily.  BP was controlled better  In the past on amlodipine and lisinopril.  At chemo on Tuesday she was given clonidine to lower BP. BP Readings from Last 3 Encounters:  05/13/13 169/89  05/11/13 149/70  05/11/13 191/96  Using medication without problems or lightheadedness: None  Chest pain with exertion:None  Edema:Occ  Short of breath: Yes, but stable  Average home BPs:  Good < 135/70-150/80 Other issues:   Elevated Cholesterol: Off zocor, not at goal <100. Worsened control Limited on exercise.  Lab Results  Component Value Date   CHOL 227* 05/06/2013   HDL 25.00* 05/06/2013   LDLCALC 163* 05/06/2013   LDLDIRECT 143.9 02/05/2013   TRIG 195.0* 05/06/2013   CHOLHDL 9 05/06/2013   Using medications without problems:Yes  Muscle aches: None  Other complaints:   Review of Systems  Constitutional: Negative for fever and fatigue.  HENT: Positive for ear pain.  Eyes: Negative for pain.  Respiratory: Negative for chest tightness and shortness of breath.  Cardiovascular: Negative for chest pain, palpitations and leg swelling.  Gastrointestinal: Negative for abdominal pain.  Genitourinary: Negative for dysuria.  Objective:   Physical Exam   Constitutional: Vital signs are normal. She appears well-developed and well-nourished. She is cooperative. Non-toxic appearance. She does not appear ill. No distress.  HENT:  Head: Normocephalic.  Right Ear: Hearing, external ear and ear canal normal. Tympanic membrane is not erythematous, not retracted and not bulging.Left Ear: Hearing, external ear and ear canal normal. Tympanic membrane is not erythematous, not retracted and not bulging. Nose: No mucosal edema or rhinorrhea. Right sinus exhibits no maxillary sinus tenderness and no frontal sinus tenderness. Left sinus exhibits no maxillary sinus tenderness and no frontal sinus tenderness.  Mouth/Throat: Uvula is midline, oropharynx is clear and moist and mucous membranes are normal.  Eyes: Conjunctivae normal, EOM and lids are normal. Pupils are equal, round, and reactive to light. No foreign bodies found.  Neck: Trachea normal and normal range of motion. Neck supple. Carotid bruit is not present. No mass and no thyromegaly present.  Cardiovascular: Normal rate, regular rhythm, S1 normal, S2 normal, normal heart sounds, intact distal pulses and normal pulses. Exam reveals no gallop and no friction rub.  No murmur heard.  Pulmonary/Chest: Effort normal and breath sounds normal. Not tachypneic. No respiratory distress. She has no decreased breath sounds. She has no wheezes. She has no rhonchi. She has no rales.  Abdominal: Soft. Normal appearance and bowel sounds are normal. There is no tenderness.  Neurological: She is alert.  Skin: Skin is warm, dry and intact. No rash noted.  Psychiatric: Her speech is normal and behavior is normal. Judgment and thought content normal. Her mood appears not anxious. Cognition and memory are normal. She does not exhibit a  depressed mood.   Diabetic foot exam:  Normal inspection  No skin breakdown  No calluses  Normal DP pulses  Normal sensation to light touch and monofilament  Nails normal

## 2013-05-13 NOTE — Assessment & Plan Note (Signed)
Inadequate control... Return to previous regimen of amlodipine and lisinopril combo.

## 2013-05-13 NOTE — Assessment & Plan Note (Signed)
Well controlled on no med. 

## 2013-05-15 ENCOUNTER — Other Ambulatory Visit: Payer: Self-pay | Admitting: Internal Medicine

## 2013-05-17 ENCOUNTER — Telehealth: Payer: Self-pay | Admitting: Internal Medicine

## 2013-05-17 NOTE — Telephone Encounter (Signed)
Talked to pt and she is aware of appt on 5/26 lab,md and chemo pt will be seen @ chemo room

## 2013-05-21 ENCOUNTER — Telehealth: Payer: Self-pay

## 2013-05-21 NOTE — Telephone Encounter (Signed)
Relevant patient education assigned to patient using Emmi. ° °

## 2013-05-26 DIAGNOSIS — C7931 Secondary malignant neoplasm of brain: Secondary | ICD-10-CM | POA: Diagnosis not present

## 2013-05-26 DIAGNOSIS — I6350 Cerebral infarction due to unspecified occlusion or stenosis of unspecified cerebral artery: Secondary | ICD-10-CM | POA: Diagnosis not present

## 2013-05-26 DIAGNOSIS — C7949 Secondary malignant neoplasm of other parts of nervous system: Secondary | ICD-10-CM | POA: Diagnosis not present

## 2013-06-01 ENCOUNTER — Other Ambulatory Visit: Payer: Medicare Other

## 2013-06-01 ENCOUNTER — Telehealth: Payer: Self-pay | Admitting: Internal Medicine

## 2013-06-01 ENCOUNTER — Encounter: Payer: Self-pay | Admitting: *Deleted

## 2013-06-01 ENCOUNTER — Ambulatory Visit (HOSPITAL_BASED_OUTPATIENT_CLINIC_OR_DEPARTMENT_OTHER): Payer: Medicare Other | Admitting: Physician Assistant

## 2013-06-01 ENCOUNTER — Encounter: Payer: Self-pay | Admitting: Physician Assistant

## 2013-06-01 ENCOUNTER — Ambulatory Visit (HOSPITAL_BASED_OUTPATIENT_CLINIC_OR_DEPARTMENT_OTHER): Payer: Medicare Other

## 2013-06-01 ENCOUNTER — Other Ambulatory Visit (HOSPITAL_BASED_OUTPATIENT_CLINIC_OR_DEPARTMENT_OTHER): Payer: Medicare Other

## 2013-06-01 VITALS — BP 143/82 | HR 83 | Temp 97.7°F | Resp 18 | Ht 63.0 in | Wt 159.5 lb

## 2013-06-01 DIAGNOSIS — C7951 Secondary malignant neoplasm of bone: Secondary | ICD-10-CM

## 2013-06-01 DIAGNOSIS — C7949 Secondary malignant neoplasm of other parts of nervous system: Secondary | ICD-10-CM

## 2013-06-01 DIAGNOSIS — C7931 Secondary malignant neoplasm of brain: Secondary | ICD-10-CM | POA: Diagnosis not present

## 2013-06-01 DIAGNOSIS — C7952 Secondary malignant neoplasm of bone marrow: Secondary | ICD-10-CM

## 2013-06-01 DIAGNOSIS — C50919 Malignant neoplasm of unspecified site of unspecified female breast: Secondary | ICD-10-CM | POA: Diagnosis not present

## 2013-06-01 DIAGNOSIS — Z5111 Encounter for antineoplastic chemotherapy: Secondary | ICD-10-CM | POA: Diagnosis not present

## 2013-06-01 DIAGNOSIS — C3410 Malignant neoplasm of upper lobe, unspecified bronchus or lung: Secondary | ICD-10-CM | POA: Diagnosis not present

## 2013-06-01 DIAGNOSIS — C779 Secondary and unspecified malignant neoplasm of lymph node, unspecified: Secondary | ICD-10-CM | POA: Diagnosis not present

## 2013-06-01 DIAGNOSIS — B370 Candidal stomatitis: Secondary | ICD-10-CM

## 2013-06-01 DIAGNOSIS — C3490 Malignant neoplasm of unspecified part of unspecified bronchus or lung: Secondary | ICD-10-CM

## 2013-06-01 LAB — COMPREHENSIVE METABOLIC PANEL (CC13)
ALBUMIN: 3.4 g/dL — AB (ref 3.5–5.0)
ALT: 9 U/L (ref 0–55)
AST: 16 U/L (ref 5–34)
Alkaline Phosphatase: 70 U/L (ref 40–150)
Anion Gap: 12 mEq/L — ABNORMAL HIGH (ref 3–11)
BILIRUBIN TOTAL: 0.22 mg/dL (ref 0.20–1.20)
BUN: 12.2 mg/dL (ref 7.0–26.0)
CO2: 23 mEq/L (ref 22–29)
Calcium: 9.8 mg/dL (ref 8.4–10.4)
Chloride: 106 mEq/L (ref 98–109)
Creatinine: 0.8 mg/dL (ref 0.6–1.1)
GLUCOSE: 144 mg/dL — AB (ref 70–140)
POTASSIUM: 4.4 meq/L (ref 3.5–5.1)
Sodium: 140 mEq/L (ref 136–145)
Total Protein: 7.2 g/dL (ref 6.4–8.3)

## 2013-06-01 LAB — RESEARCH LABS

## 2013-06-01 LAB — CBC WITH DIFFERENTIAL/PLATELET
BASO%: 0.5 % (ref 0.0–2.0)
BASOS ABS: 0 10*3/uL (ref 0.0–0.1)
EOS ABS: 0.1 10*3/uL (ref 0.0–0.5)
EOS%: 0.8 % (ref 0.0–7.0)
HEMATOCRIT: 36.6 % (ref 34.8–46.6)
HGB: 12 g/dL (ref 11.6–15.9)
LYMPH%: 22.5 % (ref 14.0–49.7)
MCH: 30.2 pg (ref 25.1–34.0)
MCHC: 32.8 g/dL (ref 31.5–36.0)
MCV: 92.1 fL (ref 79.5–101.0)
MONO#: 0.8 10*3/uL (ref 0.1–0.9)
MONO%: 10.4 % (ref 0.0–14.0)
NEUT%: 65.8 % (ref 38.4–76.8)
NEUTROS ABS: 5.1 10*3/uL (ref 1.5–6.5)
PLATELETS: 445 10*3/uL — AB (ref 145–400)
RBC: 3.97 10*6/uL (ref 3.70–5.45)
RDW: 15 % — ABNORMAL HIGH (ref 11.2–14.5)
WBC: 7.8 10*3/uL (ref 3.9–10.3)
lymph#: 1.8 10*3/uL (ref 0.9–3.3)

## 2013-06-01 MED ORDER — HEPARIN SOD (PORK) LOCK FLUSH 100 UNIT/ML IV SOLN
500.0000 [IU] | Freq: Once | INTRAVENOUS | Status: AC | PRN
  Administered 2013-06-01: 500 [IU]
  Filled 2013-06-01: qty 5

## 2013-06-01 MED ORDER — DEXAMETHASONE SODIUM PHOSPHATE 10 MG/ML IJ SOLN
INTRAMUSCULAR | Status: AC
  Filled 2013-06-01: qty 1

## 2013-06-01 MED ORDER — SODIUM CHLORIDE 0.9 % IJ SOLN
10.0000 mL | INTRAMUSCULAR | Status: DC | PRN
  Administered 2013-06-01: 10 mL
  Filled 2013-06-01: qty 10

## 2013-06-01 MED ORDER — ONDANSETRON 8 MG/50ML IVPB (CHCC)
8.0000 mg | Freq: Once | INTRAVENOUS | Status: AC
  Administered 2013-06-01: 8 mg via INTRAVENOUS

## 2013-06-01 MED ORDER — ALPRAZOLAM 0.25 MG PO TABS: 0.2500 mg | ORAL_TABLET | Freq: Every evening | ORAL | Status: DC | PRN

## 2013-06-01 MED ORDER — DEXAMETHASONE SODIUM PHOSPHATE 10 MG/ML IJ SOLN
10.0000 mg | Freq: Once | INTRAMUSCULAR | Status: AC
  Administered 2013-06-01: 10 mg via INTRAVENOUS

## 2013-06-01 MED ORDER — SODIUM CHLORIDE 0.9 % IV SOLN
500.0000 mg/m2 | Freq: Once | INTRAVENOUS | Status: AC
  Administered 2013-06-01: 900 mg via INTRAVENOUS
  Filled 2013-06-01: qty 36

## 2013-06-01 MED ORDER — MORPHINE SULFATE 30 MG PO TABS: 30.0000 mg | ORAL_TABLET | ORAL | Status: DC | PRN

## 2013-06-01 MED ORDER — SODIUM CHLORIDE 0.9 % IV SOLN
Freq: Once | INTRAVENOUS | Status: AC
  Administered 2013-06-01: 10:00:00 via INTRAVENOUS

## 2013-06-01 MED ORDER — ONDANSETRON 8 MG/NS 50 ML IVPB
INTRAVENOUS | Status: AC
  Filled 2013-06-01: qty 8

## 2013-06-01 MED ORDER — FLUCONAZOLE 100 MG PO TABS: ORAL_TABLET | ORAL | Status: DC

## 2013-06-01 MED ORDER — MORPHINE SULFATE ER 60 MG PO TBCR: 60.0000 mg | EXTENDED_RELEASE_TABLET | Freq: Two times a day (BID) | ORAL | Status: DC

## 2013-06-01 NOTE — Patient Instructions (Signed)
[facility] Discharge Instructions for Patients Receiving Chemotherapy  Today you received the following chemotherapy agents:  Alimta  To help prevent nausea and vomiting after your treatment, we encourage you to take your nausea medication as ordered per MD.   If you develop nausea and vomiting that is not controlled by your nausea medication, call the clinic.   BELOW ARE SYMPTOMS THAT SHOULD BE REPORTED IMMEDIATELY:  *FEVER GREATER THAN 100.5 F  *CHILLS WITH OR WITHOUT FEVER  NAUSEA AND VOMITING THAT IS NOT CONTROLLED WITH YOUR NAUSEA MEDICATION  *UNUSUAL SHORTNESS OF BREATH  *UNUSUAL BRUISING OR BLEEDING  TENDERNESS IN MOUTH AND THROAT WITH OR WITHOUT PRESENCE OF ULCERS  *URINARY PROBLEMS  *BOWEL PROBLEMS  UNUSUAL RASH Items with * indicate a potential emergency and should be followed up as soon as possible.  Feel free to call the clinic you have any questions or concerns. The clinic phone number is [phone].

## 2013-06-01 NOTE — Progress Notes (Addendum)
[facility] [phone]   [phone]  SHARED VISIT PROGRESS NOTE  [name], MD [place]. [name] [address]  Diagnosis:  Metastatic non-small cell lung cancer initially diagnosed with locally advanced stage IIIB with right Pancoast tumor involving the vertebral body as well as the foraminal canal invasion with spinal cord compression in October of 2007.   Prior Therapy:  1. Status post concurrent chemoradiation with weekly carboplatin and paclitaxel, last dose was given November 18, 2005. 2. 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 3. Status post gamma knife radiotherapy to a solitary brain lesion located in the superior frontal area of the brain at [facility] in April of 2008. 4. Status post palliative radiotherapy to the lateral abdominal wall metastatic lesion under the care of Dr. Lisbeth Renshaw, completed March of 2009. 5. Status post 6 cycles of systemic chemotherapy with carboplatin and Alimta. Last dose was given July 26, 2007 with disease stabilization. 6. Gamma knife stereotactic radiotherapy to 2 brain lesions one involving the right frontal dural based as well as right parietal lesion performed on 05/07/2012 under the care of Dr. Lilyan Punt at [facility].  Current Therapy:  Maintenance chemotherapy with Alimta 500 mg per meter squared given every 3 weeks. The patient is status post 99 cycles.   CHEMOTHERAPY INTENT: Palliative/maintenance  CURRENT # OF CHEMOTHERAPY CYCLES: 100 CURRENT ANTIEMETICS: Zofran, dexamethasone and Compazine  CURRENT SMOKING STATUS: Quit smoking 10/07/2005  ORAL CHEMOTHERAPY AND CONSENT: None  CURRENT BISPHOSPHONATES USE: None  PAIN MANAGEMENT: 0/10 currently on morphine  NARCOTICS INDUCED CONSTIPATION: No constipation  LIVING WILL AND CODE STATUS: No CODE BLUE   INTERVAL  HISTORY: Danielle [name] 63 y.o. female returns to the clinic today for followup visit accompanied by her sister. The patient is feeling fine today with no specific complaints. She is tolerating her maintenance treatment with single agent Alimta fairly well with no significant adverse effects. The patient denied having any chest pain, shortness of breath, cough or hemoptysis. She denied having any fever or chills, no nausea or vomiting. The patient denied having any weight loss or night sweats. She requests refill prescriptions for her MS Contin, MSIR and Xanax She is here today to start cycle #100 of her maintenance treatment.  MEDICAL HISTORY: Past Medical History  Diagnosis Date  . Hypertension   . Anemia   . H/O: pneumonia   . History of tobacco abuse quit 10/08    on nicotine patch  . Fibromyalgia   . Hypokalemia   . DJD (degenerative joint disease), cervical   . Thrush 12/11/2010  . 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 had chemo and radiation for lung ca    ALLERGIES:  is allergic to contrast media; co-trimoxazole injection; iohexol; and sulfa drugs cross reactors.  MEDICATIONS:  Current Outpatient Prescriptions  Medication Sig Dispense Refill  . ALPRAZolam (XANAX) 0.25 MG tablet Take 1 tablet (0.25 mg total) by mouth at bedtime as needed for sleep.  30 tablet  0  . amLODipine (NORVASC) 10 MG tablet Take 1 tablet (10 mg total) by mouth daily.  90 tablet  3  . antipyrine-benzocaine (AURALGAN) otic solution Place 1 drop into both ears daily.  10 mL  1  . fluticasone (FLONASE) 50 MCG/ACT nasal spray Place 1-2 sprays into the nose daily.  16 g  11  . folic acid (FOLVITE) 1 MG tablet TAKE 1 TABLET BY MOUTH DAILY.  90 tablet  3  . lidocaine-prilocaine (EMLA) cream Apply topically as needed.  30 g  1  . lisinopril (PRINIVIL,ZESTRIL) 20 MG tablet Take 20 mg by mouth daily. Take 2 tablets (40 mg total) by mouth daily.      . meclizine (ANTIVERT) 12.5 MG  tablet Take 1-2 tablets (12.5-25 mg total) by mouth 3 (three) times daily as needed.  30 tablet  1  . morphine (MS CONTIN) 60 MG 12 hr tablet Take 1 tablet (60 mg total) by mouth 2 (two) times daily.  60 tablet  0  . morphine (MSIR) 30 MG tablet Take 1 tablet (30 mg total) by mouth every 4 (four) hours as needed.  60 tablet  0  . olopatadine (PATANOL) 0.1 % ophthalmic solution Place 1 drop into both eyes 2 (two) times daily.  5 mL  11  . ondansetron (ZOFRAN) 8 MG tablet TAKE 1 TABLET (8 MG TOTAL) BY MOUTH EVERY 8 (EIGHT) HOURS AS NEEDED FOR NAUSEA.  30 tablet  2  . predniSONE (DELTASONE) 50 MG tablet 13 hrs, 7 hrs, and 1 hr before scheduled CT scans      . simvastatin (ZOCOR) 20 MG tablet Take 1 tablet (20 mg total) by mouth daily.  90 tablet  3  . fluconazole (DIFLUCAN) 100 MG tablet Take 2 tablets by mouth on day one, then take 1 tablet by  Mouth daily until completed. Do not take your cholesterol medicine while taking the fluconazole  16 tablet  0   No current facility-administered medications for this visit.    SURGICAL HISTORY:  Past Surgical History  Procedure Laterality Date  . Cervical laminectomy  1995  . Other surgical history  2008    Gamma knife surgery to remove brain met   . 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 Knee surgery  1990    Left x 2  . Portacath placement  -[name]    TIP IN CAVOATRIAL JUNCTION    REVIEW OF SYSTEMS:  Constitutional: negative Eyes: negative Ears, nose, mouth, throat, and face: negative Respiratory: negative Cardiovascular: negative Gastrointestinal: negative Genitourinary:negative Integument/breast: negative Hematologic/lymphatic: negative Musculoskeletal:negative Neurological: negative Behavioral/Psych: negative Endocrine: negative Allergic/Immunologic: negative   PHYSICAL EXAMINATION: General appearance: alert, cooperative and no distress Head: Normocephalic, without obvious abnormality,  atraumatic Neck: no adenopathy, no JVD, supple, symmetrical, trachea midline and thyroid not enlarged, symmetric, no tenderness/mass/nodules Lymph nodes: Cervical, supraclavicular, and axillary nodes normal. Resp: clear to auscultation bilaterally Back: symmetric, no curvature. ROM normal. No CVA tenderness. Cardio: regular rate and rhythm, S1, S2 normal, no murmur, click, rub or gallop GI: soft, non-tender; bowel sounds normal; no masses,  no organomegaly Extremities: extremities normal, atraumatic, no cyanosis or edema Neurologic: Alert and oriented X 3, normal strength and tone. Normal symmetric reflexes. Normal coordination and gait Mouth: reveals mild thrush  ECOG PERFORMANCE STATUS: 0 - Asymptomatic  Blood pressure 143/82, pulse 83, temperature 97.7 F (36.5 C), temperature source Oral, resp. rate 18, height '5\' 3"'  (1.6 m), weight 159 lb 8 oz (72.349 kg).  LABORATORY DATA: Lab Results  Component Value Date   WBC 7.8 06/01/2013   HGB 12.0 06/01/2013   HCT 36.6 06/01/2013   MCV 92.1 06/01/2013   PLT 445* 06/01/2013      Chemistry      Component Value Date/Time   NA 140 06/01/2013 0849  NA 138 02/05/2013 1026   K 4.4 06/01/2013 0849   K 4.3 02/05/2013 1026   CL 105 02/05/2013 1026   CL 106 06/30/2012 1004   CO2 23 06/01/2013 0849   CO2 27 02/05/2013 1026   BUN 12.2 06/01/2013 0849   BUN 9 02/05/2013 1026   CREATININE 0.8 06/01/2013 0849   CREATININE 0.6 02/05/2013 1026      Component Value Date/Time   CALCIUM 9.8 06/01/2013 0849   CALCIUM 9.2 02/05/2013 1026   ALKPHOS 70 06/01/2013 0849   ALKPHOS 75 02/05/2013 1026   AST 16 06/01/2013 0849   AST 17 02/05/2013 1026   ALT 9 06/01/2013 0849   ALT 9 02/05/2013 1026   BILITOT 0.22 06/01/2013 0849   BILITOT 0.4 02/05/2013 1026       RADIOGRAPHIC STUDIES:  ASSESSMENT AND PLAN: This is a very pleasant 63 years old white female with metastatic non-small cell lung cancer currently undergoing maintenance chemotherapy with single agent Alimta  status post 99 cycles. The patient is tolerating her treatment fairly well with no significant adverse effects.  I recommended for her to continue on maintenance and Alimta as scheduled. The patient will receive cycle #100 today. For pain management, she will continue on her current pain medication with MS Contin and MS IR, refill prescriptions were given for both of these medications as well as for her Xanax. To treat the oral candidiasis she is given a prescription for Diflucan 100 mg tablets, 2 tablets by mouth on day 1 and then one tablet daily until completed. The patient knows not to take her cholesterol medication while she is taking the fluconazole. She will followup in 3 weeks prior to cycle #101 of her maintenance chemotherapy with Alimta with a restaging CT scan of her chest, abdomen and pelvis with contrast to reevaluate her disease. Patient also knows to take the prednisone and Benadryl prior to the CT scan as she has done before. This would be 50 mg prednisone by mouth 13 hours 7 hours and 1 hour before the CT scan with 50 of Benadryl also 1 hour before the CT scan.  She was advised to call immediately if she has any concerning symptoms in the interval. The patient voices understanding of current disease status and treatment options and is in agreement with the current care plan.  All questions were answered. The patient knows to call the clinic with any problems, questions or concerns. We can certainly see the patient much sooner if necessary.  Carlton Adam, PA-C  ADDENDUM: Hematology/Oncology Attending: I had a face to face encounter with the patient today. I recommended her care plan. This is a very pleasant 63 years old white female with metastatic non-small cell lung cancer currently undergoing maintenance chemotherapy with single agent Alimta status post 99 cycles. The patient is here today for cycle #100. She is feeling fine and tolerating her treatment fairly well with no  significant adverse effects. We will proceed with cycle #100 today as scheduled. She would have repeat CT scan of the chest, abdomen and pelvis in 3 weeks for restaging of her disease after premedication with prednisone and Benadryl because of the IV dye hypersensitivity. We have a small celebration for the patient today for the cycle 100 of her treatment and prolonged survival with her maintenance therapy. She was advised to call immediately if she has any concerning symptoms in the interval.  Disclaimer: This note was dictated with voice recognition software. Similar sounding words can inadvertently be  transcribed and may not be corrected upon review. Curt Bears, MD 06/01/2013

## 2013-06-01 NOTE — Telephone Encounter (Signed)
gv adn printed appts sched and avs for pt for June and July...sed added tx.

## 2013-06-01 NOTE — Progress Notes (Signed)
06/01/13 @ 9:30 am, BMS [id number], PROs and Labs:  Danielle [name] into the [facility] for labs, to see [name], PA and to receive her 100th cycle of maintenance Alimta.  She completed the LCSS and EQ-5D-3L and had research labs drawn with her [place] labs.  She is doing well with no change in treatment and no change in disease status.

## 2013-06-01 NOTE — Patient Instructions (Signed)
Take the Diflucan as prescribed but remember to not take your cholesterol medication while you're taking the Diflucan. Followup in 3 weeks the restaging CT scan of your chest, abdomen and pelvis to reevaluate your disease. Be sure to take your prednisone and Benadryl prior to the CT scan as prescribed

## 2013-06-14 ENCOUNTER — Other Ambulatory Visit: Payer: Self-pay | Admitting: Family Medicine

## 2013-06-18 ENCOUNTER — Encounter (HOSPITAL_COMMUNITY): Payer: Self-pay

## 2013-06-18 ENCOUNTER — Ambulatory Visit (HOSPITAL_COMMUNITY): Payer: Medicare Other

## 2013-06-18 ENCOUNTER — Ambulatory Visit (HOSPITAL_COMMUNITY)
Admission: RE | Admit: 2013-06-18 | Discharge: 2013-06-18 | Disposition: A | Discharge status: Home / Self Care | Payer: Medicare Other | Source: Ambulatory Visit | Attending: Physician Assistant | Admitting: Physician Assistant

## 2013-06-18 DIAGNOSIS — I2510 Atherosclerotic heart disease of native coronary artery without angina pectoris: Secondary | ICD-10-CM | POA: Insufficient documentation

## 2013-06-18 DIAGNOSIS — M899 Disorder of bone, unspecified: Secondary | ICD-10-CM | POA: Insufficient documentation

## 2013-06-18 DIAGNOSIS — J984 Other disorders of lung: Secondary | ICD-10-CM | POA: Diagnosis not present

## 2013-06-18 DIAGNOSIS — M949 Disorder of cartilage, unspecified: Secondary | ICD-10-CM | POA: Diagnosis not present

## 2013-06-18 DIAGNOSIS — N8189 Other female genital prolapse: Secondary | ICD-10-CM | POA: Insufficient documentation

## 2013-06-18 DIAGNOSIS — J438 Other emphysema: Secondary | ICD-10-CM | POA: Diagnosis not present

## 2013-06-18 DIAGNOSIS — C3490 Malignant neoplasm of unspecified part of unspecified bronchus or lung: Secondary | ICD-10-CM | POA: Insufficient documentation

## 2013-06-18 DIAGNOSIS — K5730 Diverticulosis of large intestine without perforation or abscess without bleeding: Secondary | ICD-10-CM | POA: Diagnosis not present

## 2013-06-18 MED ORDER — IOHEXOL 300 MG/ML  SOLN
100.0000 mL | Freq: Once | INTRAMUSCULAR | Status: AC | PRN
  Administered 2013-06-18: 100 mL via INTRAVENOUS

## 2013-06-22 ENCOUNTER — Other Ambulatory Visit (HOSPITAL_BASED_OUTPATIENT_CLINIC_OR_DEPARTMENT_OTHER): Payer: Medicare Other

## 2013-06-22 ENCOUNTER — Other Ambulatory Visit: Payer: Medicare Other

## 2013-06-22 ENCOUNTER — Encounter: Payer: Self-pay | Admitting: Oncology

## 2013-06-22 ENCOUNTER — Encounter: Payer: Self-pay | Admitting: Internal Medicine

## 2013-06-22 ENCOUNTER — Ambulatory Visit (HOSPITAL_BASED_OUTPATIENT_CLINIC_OR_DEPARTMENT_OTHER): Payer: Medicare Other | Admitting: Internal Medicine

## 2013-06-22 ENCOUNTER — Ambulatory Visit (HOSPITAL_BASED_OUTPATIENT_CLINIC_OR_DEPARTMENT_OTHER): Payer: Medicare Other

## 2013-06-22 ENCOUNTER — Telehealth: Payer: Self-pay | Admitting: Internal Medicine

## 2013-06-22 VITALS — BP 116/67 | HR 93 | Temp 98.1°F | Resp 18 | Ht 63.0 in | Wt 155.2 lb

## 2013-06-22 DIAGNOSIS — C7951 Secondary malignant neoplasm of bone: Secondary | ICD-10-CM

## 2013-06-22 DIAGNOSIS — C7949 Secondary malignant neoplasm of other parts of nervous system: Secondary | ICD-10-CM

## 2013-06-22 DIAGNOSIS — C3410 Malignant neoplasm of upper lobe, unspecified bronchus or lung: Secondary | ICD-10-CM

## 2013-06-22 DIAGNOSIS — C7931 Secondary malignant neoplasm of brain: Secondary | ICD-10-CM

## 2013-06-22 DIAGNOSIS — Z5111 Encounter for antineoplastic chemotherapy: Secondary | ICD-10-CM | POA: Diagnosis not present

## 2013-06-22 DIAGNOSIS — C7952 Secondary malignant neoplasm of bone marrow: Secondary | ICD-10-CM

## 2013-06-22 DIAGNOSIS — R52 Pain, unspecified: Secondary | ICD-10-CM | POA: Diagnosis not present

## 2013-06-22 DIAGNOSIS — C3490 Malignant neoplasm of unspecified part of unspecified bronchus or lung: Secondary | ICD-10-CM

## 2013-06-22 DIAGNOSIS — C3491 Malignant neoplasm of unspecified part of right bronchus or lung: Secondary | ICD-10-CM

## 2013-06-22 LAB — CBC WITH DIFFERENTIAL/PLATELET
BASO%: 0.4 % (ref 0.0–2.0)
Basophils Absolute: 0 10*3/uL (ref 0.0–0.1)
EOS ABS: 0 10*3/uL (ref 0.0–0.5)
EOS%: 0.6 % (ref 0.0–7.0)
HCT: 36.7 % (ref 34.8–46.6)
HGB: 12.1 g/dL (ref 11.6–15.9)
LYMPH%: 23 % (ref 14.0–49.7)
MCH: 30.4 pg (ref 25.1–34.0)
MCHC: 33 g/dL (ref 31.5–36.0)
MCV: 92.1 fL (ref 79.5–101.0)
MONO#: 0.8 10*3/uL (ref 0.1–0.9)
MONO%: 11 % (ref 0.0–14.0)
NEUT#: 4.6 10*3/uL (ref 1.5–6.5)
NEUT%: 65 % (ref 38.4–76.8)
PLATELETS: 401 10*3/uL — AB (ref 145–400)
RBC: 3.99 10*6/uL (ref 3.70–5.45)
RDW: 15 % — ABNORMAL HIGH (ref 11.2–14.5)
WBC: 7 10*3/uL (ref 3.9–10.3)
lymph#: 1.6 10*3/uL (ref 0.9–3.3)

## 2013-06-22 LAB — COMPREHENSIVE METABOLIC PANEL (CC13)
ALBUMIN: 3.4 g/dL — AB (ref 3.5–5.0)
ALK PHOS: 77 U/L (ref 40–150)
ALT: 8 U/L (ref 0–55)
AST: 14 U/L (ref 5–34)
Anion Gap: 8 mEq/L (ref 3–11)
BUN: 6.9 mg/dL — ABNORMAL LOW (ref 7.0–26.0)
CO2: 26 mEq/L (ref 22–29)
Calcium: 9.8 mg/dL (ref 8.4–10.4)
Chloride: 108 mEq/L (ref 98–109)
Creatinine: 0.8 mg/dL (ref 0.6–1.1)
Glucose: 140 mg/dl (ref 70–140)
POTASSIUM: 4.2 meq/L (ref 3.5–5.1)
SODIUM: 141 meq/L (ref 136–145)
TOTAL PROTEIN: 7.1 g/dL (ref 6.4–8.3)
Total Bilirubin: 0.26 mg/dL (ref 0.20–1.20)

## 2013-06-22 MED ORDER — SODIUM CHLORIDE 0.9 % IJ SOLN
10.0000 mL | INTRAMUSCULAR | Status: DC | PRN
  Administered 2013-06-22: 10 mL
  Filled 2013-06-22: qty 10

## 2013-06-22 MED ORDER — SODIUM CHLORIDE 0.9 % IV SOLN
Freq: Once | INTRAVENOUS | Status: AC
  Administered 2013-06-22: 10:00:00 via INTRAVENOUS

## 2013-06-22 MED ORDER — DEXAMETHASONE SODIUM PHOSPHATE 10 MG/ML IJ SOLN
10.0000 mg | Freq: Once | INTRAMUSCULAR | Status: AC
  Administered 2013-06-22: 10 mg via INTRAVENOUS

## 2013-06-22 MED ORDER — ONDANSETRON 8 MG/NS 50 ML IVPB
INTRAVENOUS | Status: AC
  Filled 2013-06-22: qty 8

## 2013-06-22 MED ORDER — DEXAMETHASONE SODIUM PHOSPHATE 10 MG/ML IJ SOLN
INTRAMUSCULAR | Status: AC
  Filled 2013-06-22: qty 1

## 2013-06-22 MED ORDER — HEPARIN SOD (PORK) LOCK FLUSH 100 UNIT/ML IV SOLN
500.0000 [IU] | Freq: Once | INTRAVENOUS | Status: AC | PRN
  Administered 2013-06-22: 500 [IU]
  Filled 2013-06-22: qty 5

## 2013-06-22 MED ORDER — ONDANSETRON 8 MG/50ML IVPB (CHCC)
8.0000 mg | Freq: Once | INTRAVENOUS | Status: AC
  Administered 2013-06-22: 8 mg via INTRAVENOUS

## 2013-06-22 MED ORDER — SODIUM CHLORIDE 0.9 % IV SOLN
500.0000 mg/m2 | Freq: Once | INTRAVENOUS | Status: AC
  Administered 2013-06-22: 900 mg via INTRAVENOUS
  Filled 2013-06-22: qty 36

## 2013-06-22 NOTE — Patient Instructions (Signed)
[facility] Discharge Instructions for Patients Receiving Chemotherapy  Today you received the following chemotherapy agents Alimta.  To help prevent nausea and vomiting after your treatment, we encourage you to take your nausea medication.   If you develop nausea and vomiting that is not controlled by your nausea medication, call the clinic.   BELOW ARE SYMPTOMS THAT SHOULD BE REPORTED IMMEDIATELY:  *FEVER GREATER THAN 100.5 F  *CHILLS WITH OR WITHOUT FEVER  NAUSEA AND VOMITING THAT IS NOT CONTROLLED WITH YOUR NAUSEA MEDICATION  *UNUSUAL SHORTNESS OF BREATH  *UNUSUAL BRUISING OR BLEEDING  TENDERNESS IN MOUTH AND THROAT WITH OR WITHOUT PRESENCE OF ULCERS  *URINARY PROBLEMS  *BOWEL PROBLEMS  UNUSUAL RASH Items with * indicate a potential emergency and should be followed up as soon as possible.  Feel free to call the clinic you have any questions or concerns. The clinic phone number is [phone].

## 2013-06-22 NOTE — Telephone Encounter (Signed)
gv and printed appt sched and avs fo rpt for July °

## 2013-06-22 NOTE — Progress Notes (Signed)
06/22/13 - BMS [id number] - Questionnaires (PRO's) - The patient into [facility] this am for her visit.  The patient was given her questionnaires upon arrival to the cancer center. The patient completed PRO's before any study procedures were performed.  I checked the PRO's for completeness.  I thanked the patient for her continued support of this clinical trial.

## 2013-06-22 NOTE — Progress Notes (Signed)
Danielle [name] [phone]   [phone]  OFFICE PROGRESS NOTE  Danielle Lofts, MD [place] [name] [address]  Diagnosis:  Metastatic non-small cell lung cancer initially diagnosed with locally advanced stage IIIB with right Pancoast tumor involving the vertebral body as well as the foraminal canal invasion with spinal cord compression in October of 2007.   Prior Therapy:  1. Status post concurrent chemoradiation with weekly carboplatin and paclitaxel, last dose was given November 18, 2005. 2. 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 3. Status post gamma knife radiotherapy to a solitary brain lesion located in the superior frontal area of the brain at [facility] in April of 2008. 4. Status post palliative radiotherapy to the lateral abdominal wall metastatic lesion under the care of Dr. Lisbeth Renshaw, completed March of 2009. 5. Status post 6 cycles of systemic chemotherapy with carboplatin and Alimta. Last dose was given July 26, 2007 with disease stabilization. 6. Gamma knife stereotactic radiotherapy to 2 brain lesions one involving the right frontal dural based as well as right parietal lesion performed on 05/07/2012 under the care of Dr. Lilyan Punt at [facility].  Current Therapy:  Maintenance chemotherapy with Alimta 500 mg per meter squared given every 3 weeks. The patient is status post 100 cycles.   CHEMOTHERAPY INTENT: Palliative/maintenance  CURRENT # OF CHEMOTHERAPY CYCLES: 101 CURRENT ANTIEMETICS: Zofran, dexamethasone and Compazine  CURRENT SMOKING STATUS: Quit smoking 10/07/2005  ORAL CHEMOTHERAPY AND CONSENT: None  CURRENT BISPHOSPHONATES USE: None  PAIN MANAGEMENT: 0/10 currently on morphine  NARCOTICS INDUCED CONSTIPATION: No constipation  LIVING WILL AND CODE STATUS: No CODE BLUE   INTERVAL  HISTORY: Danielle [name] 63 y.o. female returns to the clinic today for followup visit accompanied by her sister. She completed 100 cycle of maintenance Alimta 3 weeks ago. The patient is feeling fine today with no specific complaints. She is tolerating her maintenance treatment with single agent Alimta fairly well with no significant adverse effects. The patient denied having any chest pain, shortness of breath, cough or hemoptysis. She denied having any fever or chills, no nausea or vomiting. The patient denied having any weight loss or night sweats. She had repeat CT scan of the chest, abdomen and pelvis performed recently and she is here today for evaluation and discussion of her scan results before starting cycle #101 of her maintenance treatment.  MEDICAL HISTORY: Past Medical History  Diagnosis Date  . Hypertension   . Anemia   . H/O: pneumonia   . History of tobacco abuse quit 10/08    on nicotine patch  . Fibromyalgia   . Hypokalemia   . DJD (degenerative joint disease), cervical   . Thrush 12/11/2010  . 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 had chemo and radiation for lung ca    ALLERGIES:  is allergic to contrast media; co-trimoxazole injection; iohexol; and sulfa drugs cross reactors.  MEDICATIONS:  Current Outpatient Prescriptions  Medication Sig Dispense Refill  . ALPRAZolam (XANAX) 0.25 MG tablet Take 1 tablet (0.25 mg total) by mouth at bedtime as needed for sleep.  30 tablet  0  . amLODipine (NORVASC) 10 MG tablet Take 1 tablet (10 mg total) by mouth daily.  90 tablet  3  . antipyrine-benzocaine (AURALGAN) otic solution Place 1 drop into both ears daily.  10 mL  1  . fluconazole (DIFLUCAN) 100 MG tablet Take 2 tablets by mouth on day one, then take 1 tablet by  Mouth daily until completed. Do not take your cholesterol medicine while taking the fluconazole  16 tablet  0  . fluticasone (FLONASE) 50 MCG/ACT nasal spray Place 1-2 sprays into  the nose daily.  16 g  11  . folic acid (FOLVITE) 1 MG tablet TAKE 1 TABLET BY MOUTH DAILY.  90 tablet  3  . lidocaine-prilocaine (EMLA) cream Apply topically as needed.  30 g  1  . lisinopril (PRINIVIL,ZESTRIL) 20 MG tablet TAKE 2 TABLETS (40 MG TOTAL) BY MOUTH DAILY.  60 tablet  5  . meclizine (ANTIVERT) 12.5 MG tablet Take 1-2 tablets (12.5-25 mg total) by mouth 3 (three) times daily as needed.  30 tablet  1  . morphine (MS CONTIN) 60 MG 12 hr tablet Take 1 tablet (60 mg total) by mouth 2 (two) times daily.  60 tablet  0  . morphine (MSIR) 30 MG tablet Take 1 tablet (30 mg total) by mouth every 4 (four) hours as needed.  60 tablet  0  . olopatadine (PATANOL) 0.1 % ophthalmic solution Place 1 drop into both eyes 2 (two) times daily.  5 mL  11  . ondansetron (ZOFRAN) 8 MG tablet TAKE 1 TABLET (8 MG TOTAL) BY MOUTH EVERY 8 (EIGHT) HOURS AS NEEDED FOR NAUSEA.  30 tablet  2  . predniSONE (DELTASONE) 50 MG tablet 13 hrs, 7 hrs, and 1 hr before scheduled CT scans      . simvastatin (ZOCOR) 20 MG tablet Take 1 tablet (20 mg total) by mouth daily.  90 tablet  3   No current facility-administered medications for this visit.    SURGICAL HISTORY:  Past Surgical History  Procedure Laterality Date  . Cervical laminectomy  1995  . Other surgical history  2008    Gamma knife surgery to remove brain met   . 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 Knee surgery  1990    Left x 2  . Portacath placement  -Danielle [name]    TIP IN CAVOATRIAL JUNCTION    REVIEW OF SYSTEMS:  Constitutional: negative Eyes: negative Ears, nose, mouth, throat, and face: negative Respiratory: negative Cardiovascular: negative Gastrointestinal: negative Genitourinary:negative Integument/breast: negative Hematologic/lymphatic: negative Musculoskeletal:negative Neurological: negative Behavioral/Psych: negative Endocrine: negative Allergic/Immunologic: negative   PHYSICAL EXAMINATION: General  appearance: alert, cooperative and no distress Head: Normocephalic, without obvious abnormality, atraumatic Neck: no adenopathy, no JVD, supple, symmetrical, trachea midline and thyroid not enlarged, symmetric, no tenderness/mass/nodules Lymph nodes: Cervical, supraclavicular, and axillary nodes normal. Resp: clear to auscultation bilaterally Back: symmetric, no curvature. ROM normal. No CVA tenderness. Cardio: regular rate and rhythm, S1, S2 normal, no murmur, click, rub or gallop GI: soft, non-tender; bowel sounds normal; no masses,  no organomegaly Extremities: extremities normal, atraumatic, no cyanosis or edema Neurologic: Alert and oriented X 3, normal strength and tone. Normal symmetric reflexes. Normal coordination and gait  ECOG PERFORMANCE STATUS: 0 - Asymptomatic  Blood pressure 116/67, pulse 93, temperature 98.1 F (36.7 C), temperature source Oral, resp. rate 18, height '5\' 3"'  (1.6 m), weight 155 lb 3.2 oz (70.398 kg), SpO2 100.00%.  LABORATORY DATA: Lab Results  Component Value Date   WBC 7.0 06/22/2013   HGB 12.1 06/22/2013   HCT 36.7 06/22/2013   MCV 92.1 06/22/2013   PLT 401* 06/22/2013      Chemistry  Component Value Date/Time   NA 141 06/22/2013 0849   NA 138 02/05/2013 1026   K 4.2 06/22/2013 0849   K 4.3 02/05/2013 1026   CL 105 02/05/2013 1026   CL 106 06/30/2012 1004   CO2 26 06/22/2013 0849   CO2 27 02/05/2013 1026   BUN 6.9* 06/22/2013 0849   BUN 9 02/05/2013 1026   CREATININE 0.8 06/22/2013 0849   CREATININE 0.6 02/05/2013 1026      Component Value Date/Time   CALCIUM 9.8 06/22/2013 0849   CALCIUM 9.2 02/05/2013 1026   ALKPHOS 77 06/22/2013 0849   ALKPHOS 75 02/05/2013 1026   AST 14 06/22/2013 0849   AST 17 02/05/2013 1026   ALT 8 06/22/2013 0849   ALT 9 02/05/2013 1026   BILITOT 0.26 06/22/2013 0849   BILITOT 0.4 02/05/2013 1026       RADIOGRAPHIC STUDIES: Ct Chest W Contrast  06/18/2013   CLINICAL DATA:  Restaging of non-small cell lung cancer.  EXAM: CT  CHEST, ABDOMEN, AND PELVIS WITH CONTRAST  TECHNIQUE: Multidetector CT imaging of the chest, abdomen and pelvis was performed following the standard protocol during bolus administration of intravenous contrast.  CONTRAST:  170m OMNIPAQUE IOHEXOL 300 MG/ML  SOLN  COMPARISON:  04/16/2013  FINDINGS:  CT CHEST FINDINGS  Lungs/Pleura: Probable secretions in the dependent trachea on image 16. Moderate centrilobular emphysema.  Right greater than left biapical pleural parenchymal scarring. Pleural-based opacity at the right upper lobe is unchanged, measuring 11 mm on image 18 of series 5.  Similar peribronchovascular soft tissue thickening in the medial right upper lobe on image 18.  No pleural fluid. Posterior right apical pleural thickening is unchanged, including on image 9.  Heart/Mediastinum: No supraclavicular adenopathy. A right-sided Port-A-Cath which terminates at the high right atrium on this supine exam. Advanced aortic and branch vessel atherosclerosis. Normal heart size, without pericardial effusion. No central pulmonary embolism, on this non-dedicated study. LAD and left circumflex coronary artery atherosclerosis.  No mediastinal or hilar adenopathy.   CT ABDOMEN AND PELVIS FINDINGS  Abdomen/Pelvis: Normal liver, spleen, stomach, pancreas, gallbladder, biliary tract, adrenal glands, kidneys.  Aortic and branch vessel atherosclerosis. No retroperitoneal or retrocrural adenopathy. Scattered colonic diverticula. Normal small bowel without abdominal ascites. No evidence of omental or peritoneal disease.  No pelvic adenopathy. Pelvic floor laxity. Otherwise, normal urinary bladder and uterus, without adnexal mass or significant free pelvic fluid.  Bones/Musculoskeletal: Tiny sclerotic lesion in the left ischium is similar and likely a bone island. 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 Example coronal image 50 on the right and coronal image 42 on the left. This is unchanged. Prior  vertebral augmentation at T4 and T7. T3 severe compression deformity which is similar. Moderate disc bulges at L2-3 and L1-2.    IMPRESSION: CT CHEST IMPRESSION  1. No evidence of recurrent or metastatic disease within the chest. Similar pleural-based right upper lobe opacity which is likely an area of scarring. 2. Moderate centrilobular emphysema. 3. Age advanced coronary artery atherosclerosis. Recommend assessment of coronary risk factors and consideration of medical therapy.  CT ABDOMEN AND PELVIS IMPRESSION  1. No acute process or evidence of metastatic disease in the abdomen or pelvis. 2. Pelvic floor laxity. 3. 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 4. Prior thoracic vertebral augmentation with similar T3 severe compression deformity.   Electronically Signed   By: KAbigail MiyamotoM.D.   On: 06/18/2013 09:16   ASSESSMENT AND PLAN: This is a very pleasant 62  years old white female with metastatic non-small cell lung cancer currently undergoing maintenance chemotherapy with single agent Alimta status post 100 cycles. The patient is tolerating her treatment fairly well with no significant adverse effects.  Her recent CT scan of the chest, abdomen and pelvis showed no evidence for disease progression. I discussed the scan results with the patient and her sister I recommended for her to continue her current treatment with maintenance Alimta as scheduled. She will proceed with cycle #101 today. For pain management, she will continue on her current pain medication with MS Contin and MS IR The patient would come back for followup visit in 3 weeks for evaluation before starting cycle #102. She was advised to call immediately if she has any concerning symptoms in the interval. The patient voices understanding of current disease status and treatment options and is in agreement with the current care plan.  All questions were answered. The patient knows to call the clinic with any problems,  questions or concerns. We can certainly see the patient much sooner if necessary.  Disclaimer: This note was dictated with voice recognition software. Similar sounding words can inadvertently be transcribed and may not be corrected upon review.

## 2013-07-05 ENCOUNTER — Other Ambulatory Visit: Payer: Self-pay | Admitting: *Deleted

## 2013-07-05 DIAGNOSIS — C3410 Malignant neoplasm of upper lobe, unspecified bronchus or lung: Secondary | ICD-10-CM

## 2013-07-05 MED ORDER — MORPHINE SULFATE ER 60 MG PO TBCR: 60.0000 mg | EXTENDED_RELEASE_TABLET | Freq: Two times a day (BID) | ORAL | Status: DC

## 2013-07-05 MED ORDER — ALPRAZOLAM 0.25 MG PO TABS: 0.2500 mg | ORAL_TABLET | Freq: Every evening | ORAL | Status: DC | PRN

## 2013-07-05 MED ORDER — MORPHINE SULFATE 30 MG PO TABS: 30.0000 mg | ORAL_TABLET | ORAL | Status: DC | PRN

## 2013-07-12 ENCOUNTER — Encounter: Payer: Self-pay | Admitting: Pharmacist

## 2013-07-12 ENCOUNTER — Encounter: Payer: Self-pay | Admitting: *Deleted

## 2013-07-12 DIAGNOSIS — C3400 Malignant neoplasm of unspecified main bronchus: Secondary | ICD-10-CM

## 2013-07-13 ENCOUNTER — Ambulatory Visit: Payer: Medicare Other

## 2013-07-13 ENCOUNTER — Other Ambulatory Visit: Payer: Medicare Other

## 2013-07-13 ENCOUNTER — Other Ambulatory Visit (HOSPITAL_BASED_OUTPATIENT_CLINIC_OR_DEPARTMENT_OTHER): Payer: Medicare Other

## 2013-07-13 ENCOUNTER — Telehealth: Payer: Self-pay | Admitting: Internal Medicine

## 2013-07-13 ENCOUNTER — Ambulatory Visit (HOSPITAL_BASED_OUTPATIENT_CLINIC_OR_DEPARTMENT_OTHER): Payer: Medicare Other | Admitting: Physician Assistant

## 2013-07-13 ENCOUNTER — Encounter: Payer: Self-pay | Admitting: Physician Assistant

## 2013-07-13 ENCOUNTER — Ambulatory Visit (HOSPITAL_BASED_OUTPATIENT_CLINIC_OR_DEPARTMENT_OTHER): Payer: Medicare Other

## 2013-07-13 VITALS — BP 123/71 | HR 80 | Temp 97.8°F | Resp 19 | Ht 63.0 in | Wt 158.0 lb

## 2013-07-13 DIAGNOSIS — C3410 Malignant neoplasm of upper lobe, unspecified bronchus or lung: Secondary | ICD-10-CM

## 2013-07-13 DIAGNOSIS — C7949 Secondary malignant neoplasm of other parts of nervous system: Secondary | ICD-10-CM

## 2013-07-13 DIAGNOSIS — R5381 Other malaise: Secondary | ICD-10-CM

## 2013-07-13 DIAGNOSIS — C7951 Secondary malignant neoplasm of bone: Secondary | ICD-10-CM

## 2013-07-13 DIAGNOSIS — R5383 Other fatigue: Secondary | ICD-10-CM | POA: Diagnosis not present

## 2013-07-13 DIAGNOSIS — C3490 Malignant neoplasm of unspecified part of unspecified bronchus or lung: Secondary | ICD-10-CM

## 2013-07-13 DIAGNOSIS — B370 Candidal stomatitis: Secondary | ICD-10-CM | POA: Diagnosis not present

## 2013-07-13 DIAGNOSIS — C3400 Malignant neoplasm of unspecified main bronchus: Secondary | ICD-10-CM

## 2013-07-13 DIAGNOSIS — C3411 Malignant neoplasm of upper lobe, right bronchus or lung: Secondary | ICD-10-CM

## 2013-07-13 DIAGNOSIS — C7931 Secondary malignant neoplasm of brain: Secondary | ICD-10-CM

## 2013-07-13 DIAGNOSIS — Z5111 Encounter for antineoplastic chemotherapy: Secondary | ICD-10-CM | POA: Diagnosis not present

## 2013-07-13 DIAGNOSIS — C7952 Secondary malignant neoplasm of bone marrow: Secondary | ICD-10-CM

## 2013-07-13 DIAGNOSIS — C3491 Malignant neoplasm of unspecified part of right bronchus or lung: Secondary | ICD-10-CM

## 2013-07-13 LAB — CBC WITH DIFFERENTIAL/PLATELET
BASO%: 0.6 % (ref 0.0–2.0)
Basophils Absolute: 0 10*3/uL (ref 0.0–0.1)
EOS%: 1.1 % (ref 0.0–7.0)
Eosinophils Absolute: 0.1 10*3/uL (ref 0.0–0.5)
HEMATOCRIT: 37.1 % (ref 34.8–46.6)
HGB: 12.2 g/dL (ref 11.6–15.9)
LYMPH#: 1.9 10*3/uL (ref 0.9–3.3)
LYMPH%: 26.2 % (ref 14.0–49.7)
MCH: 30.4 pg (ref 25.1–34.0)
MCHC: 32.9 g/dL (ref 31.5–36.0)
MCV: 92.5 fL (ref 79.5–101.0)
MONO#: 0.9 10*3/uL (ref 0.1–0.9)
MONO%: 12.9 % (ref 0.0–14.0)
NEUT#: 4.2 10*3/uL (ref 1.5–6.5)
NEUT%: 59.2 % (ref 38.4–76.8)
Platelets: 375 10*3/uL (ref 145–400)
RBC: 4.01 10*6/uL (ref 3.70–5.45)
RDW: 15.5 % — ABNORMAL HIGH (ref 11.2–14.5)
WBC: 7.2 10*3/uL (ref 3.9–10.3)

## 2013-07-13 LAB — COMPREHENSIVE METABOLIC PANEL (CC13)
ALT: 11 U/L (ref 0–55)
AST: 15 U/L (ref 5–34)
Albumin: 3.5 g/dL (ref 3.5–5.0)
Alkaline Phosphatase: 79 U/L (ref 40–150)
Anion Gap: 10 mEq/L (ref 3–11)
BILIRUBIN TOTAL: 0.31 mg/dL (ref 0.20–1.20)
BUN: 8.6 mg/dL (ref 7.0–26.0)
CALCIUM: 10.1 mg/dL (ref 8.4–10.4)
CHLORIDE: 108 meq/L (ref 98–109)
CO2: 23 mEq/L (ref 22–29)
Creatinine: 0.8 mg/dL (ref 0.6–1.1)
Glucose: 101 mg/dl (ref 70–140)
Potassium: 4.5 mEq/L (ref 3.5–5.1)
Sodium: 142 mEq/L (ref 136–145)
Total Protein: 7.1 g/dL (ref 6.4–8.3)

## 2013-07-13 LAB — RESEARCH LABS

## 2013-07-13 MED ORDER — ONDANSETRON 8 MG/NS 50 ML IVPB
INTRAVENOUS | Status: AC
  Filled 2013-07-13: qty 8

## 2013-07-13 MED ORDER — SODIUM CHLORIDE 0.9 % IV SOLN
500.0000 mg/m2 | Freq: Once | INTRAVENOUS | Status: AC
  Administered 2013-07-13: 900 mg via INTRAVENOUS
  Filled 2013-07-13: qty 36

## 2013-07-13 MED ORDER — FLUCONAZOLE 100 MG PO TABS
ORAL_TABLET | ORAL | Status: DC
Start: 2013-07-13 — End: 2013-08-03

## 2013-07-13 MED ORDER — DEXAMETHASONE SODIUM PHOSPHATE 10 MG/ML IJ SOLN
10.0000 mg | Freq: Once | INTRAMUSCULAR | Status: AC
  Administered 2013-07-13: 10 mg via INTRAVENOUS

## 2013-07-13 MED ORDER — SODIUM CHLORIDE 0.9 % IV SOLN
Freq: Once | INTRAVENOUS | Status: AC
  Administered 2013-07-13: 10:00:00 via INTRAVENOUS

## 2013-07-13 MED ORDER — CYANOCOBALAMIN 1000 MCG/ML IJ SOLN
INTRAMUSCULAR | Status: AC
  Filled 2013-07-13: qty 1

## 2013-07-13 MED ORDER — DEXAMETHASONE SODIUM PHOSPHATE 10 MG/ML IJ SOLN
INTRAMUSCULAR | Status: AC
  Filled 2013-07-13: qty 1

## 2013-07-13 MED ORDER — ONDANSETRON HCL 8 MG PO TABS: ORAL_TABLET | ORAL | Status: DC

## 2013-07-13 MED ORDER — HEPARIN SOD (PORK) LOCK FLUSH 100 UNIT/ML IV SOLN
500.0000 [IU] | Freq: Once | INTRAVENOUS | Status: AC | PRN
  Administered 2013-07-13: 500 [IU]
  Filled 2013-07-13: qty 5

## 2013-07-13 MED ORDER — CYANOCOBALAMIN 1000 MCG/ML IJ SOLN
1000.0000 ug | Freq: Once | INTRAMUSCULAR | Status: AC
  Administered 2013-07-13: 1000 ug via INTRAMUSCULAR

## 2013-07-13 MED ORDER — SODIUM CHLORIDE 0.9 % IJ SOLN
10.0000 mL | INTRAMUSCULAR | Status: DC | PRN
  Administered 2013-07-13: 10 mL
  Filled 2013-07-13: qty 10

## 2013-07-13 MED ORDER — ONDANSETRON 8 MG/50ML IVPB (CHCC)
8.0000 mg | Freq: Once | INTRAVENOUS | Status: AC
  Administered 2013-07-13: 8 mg via INTRAVENOUS

## 2013-07-13 NOTE — Patient Instructions (Signed)
Take the diflucan as prescribed for your thrush. Remember to hold your cholesterol medication while on the Diflucan. Follow up in 3 weeks

## 2013-07-13 NOTE — Progress Notes (Addendum)
[place] [phone]   [phone]  SHARED VISIT PROGRESS NOTE  Danielle Lofts, MD Danielle [name]. [name] [address]  Diagnosis:  Metastatic non-small cell lung cancer initially diagnosed with locally advanced stage IIIB with right Pancoast tumor involving the vertebral body as well as the foraminal canal invasion with spinal cord compression in October of 2007.   Prior Therapy:  1. Status post concurrent chemoradiation with weekly carboplatin and paclitaxel, last dose was given November 18, 2005. 2. 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 3. Status post gamma knife radiotherapy to a solitary brain lesion located in the superior frontal area of the brain at [facility] in April of 2008. 4. Status post palliative radiotherapy to the lateral abdominal wall metastatic lesion under the care of Dr. Lisbeth Renshaw, completed March of 2009. 5. Status post 6 cycles of systemic chemotherapy with carboplatin and Alimta. Last dose was given July 26, 2007 with disease stabilization. 6. Gamma knife stereotactic radiotherapy to 2 brain lesions one involving the right frontal dural based as well as right parietal lesion performed on 05/07/2012 under the care of Dr. Lilyan Punt at [facility].  Current Therapy:  Maintenance chemotherapy with Alimta 500 mg per meter squared given every 3 weeks. The patient is status post 101 cycles.   CHEMOTHERAPY INTENT: Palliative/maintenance  CURRENT # OF CHEMOTHERAPY CYCLES: 102 CURRENT ANTIEMETICS: Zofran, dexamethasone and Compazine  CURRENT SMOKING STATUS: Quit smoking 10/07/2005  ORAL CHEMOTHERAPY AND CONSENT: None  CURRENT BISPHOSPHONATES USE: None  PAIN MANAGEMENT: 0/10 currently on morphine  NARCOTICS INDUCED CONSTIPATION: No constipation  LIVING WILL AND CODE STATUS: No CODE BLUE   INTERVAL  HISTORY: Danielle [name] 63 y.o. female returns to the clinic today for followup  She is currently status post 101 cycles of maintenance chemotherapy with single agent Alimta. She reports that she did then developed in some salty foods over the July 4 weekend and as a result has some mild lower extremity swelling. The patient is feeling fine today with no other specific complaints. She is tolerating her maintenance treatment with single agent Alimta fairly well with no significant adverse effects. The patient denied having any chest pain, shortness of breath, cough or hemoptysis. She denied having any fever or chills, no nausea or vomiting. The patient denied having any weight loss or night sweats. She requests a refill prescription for her Zofran. She presents to proceed to cycle #102 of her maintenance chemotherapy with single agent Alimta.   MEDICAL HISTORY: Past Medical History  Diagnosis Date  . Hypertension   . Anemia   . H/O: pneumonia   . History of tobacco abuse quit 10/08    on nicotine patch  . Fibromyalgia   . Hypokalemia   . DJD (degenerative joint disease), cervical   . Thrush 12/11/2010  . 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 had chemo and radiation for lung ca    ALLERGIES:  is allergic to contrast media; co-trimoxazole injection; iohexol; and sulfa drugs cross reactors.  MEDICATIONS:  Current Outpatient Prescriptions  Medication Sig Dispense Refill  . ALPRAZolam (XANAX) 0.25 MG tablet Take 1 tablet (0.25 mg total) by mouth at bedtime as needed for sleep.  30 tablet  0  . amLODipine (NORVASC) 10 MG tablet Take 1 tablet (10 mg total) by mouth daily.  90 tablet  3  . antipyrine-benzocaine (AURALGAN) otic solution Place 1 drop into both ears daily.  10 mL  1  . fluconazole (DIFLUCAN) 100 MG tablet Take 2 tablets by mouth on day one, then take 1 tablet  daily until completed. Hold your cholesterol medicine while taking the fluconazole  16  tablet  0  . fluticasone (FLONASE) 50 MCG/ACT nasal spray Place 1-2 sprays into the nose daily.  16 g  11  . folic acid (FOLVITE) 1 MG tablet TAKE 1 TABLET BY MOUTH DAILY.  90 tablet  3  . lidocaine-prilocaine (EMLA) cream Apply topically as needed.  30 g  1  . lisinopril (PRINIVIL,ZESTRIL) 20 MG tablet TAKE 2 TABLETS (40 MG TOTAL) BY MOUTH DAILY.  60 tablet  5  . meclizine (ANTIVERT) 12.5 MG tablet Take 1-2 tablets (12.5-25 mg total) by mouth 3 (three) times daily as needed.  30 tablet  1  . morphine (MS CONTIN) 60 MG 12 hr tablet Take 1 tablet (60 mg total) by mouth 2 (two) times daily.  60 tablet  0  . morphine (MSIR) 30 MG tablet Take 1 tablet (30 mg total) by mouth every 4 (four) hours as needed.  60 tablet  0  . olopatadine (PATANOL) 0.1 % ophthalmic solution Place 1 drop into both eyes 2 (two) times daily.  5 mL  11  . ondansetron (ZOFRAN) 8 MG tablet TAKE 1 TABLET (8 MG TOTAL) BY MOUTH EVERY 8 (EIGHT) HOURS AS NEEDED FOR NAUSEA.  30 tablet  2  . predniSONE (DELTASONE) 50 MG tablet 13 hrs, 7 hrs, and 1 hr before scheduled CT scans      . simvastatin (ZOCOR) 20 MG tablet Take 1 tablet (20 mg total) by mouth daily.  90 tablet  3   No current facility-administered medications for this visit.   Facility-Administered Medications Ordered in Other Visits  Medication Dose Route Frequency Provider Last Rate Last Dose  . sodium chloride 0.9 % injection 10 mL  10 mL Intracatheter PRN Curt Bears, MD   10 mL at 07/13/13 1045    SURGICAL HISTORY:  Past Surgical History  Procedure Laterality Date  . Cervical laminectomy  1995  . Other surgical history  2008    Gamma knife surgery to remove brain met   . 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 Knee surgery  1990    Left x 2  . Portacath placement  -[name]    TIP IN CAVOATRIAL JUNCTION    REVIEW OF SYSTEMS:  Constitutional: negative Eyes: negative Ears, nose, mouth, throat, and face:  negative Respiratory: negative Cardiovascular: positive for lower extremity edema Gastrointestinal: positive for nausea Genitourinary:negative Integument/breast: negative Hematologic/lymphatic: negative Musculoskeletal:negative Neurological: negative Behavioral/Psych: negative Endocrine: negative Allergic/Immunologic: negative   PHYSICAL EXAMINATION: General appearance: alert, cooperative and no distress Head: Normocephalic, without obvious abnormality, atraumatic Neck: no adenopathy, no JVD, supple, symmetrical, trachea midline and thyroid not enlarged, symmetric, no tenderness/mass/nodules Lymph nodes: Cervical, supraclavicular, and axillary nodes normal. Resp: clear to auscultation bilaterally Back: symmetric, no curvature. ROM normal. No CVA tenderness. Cardio: regular rate and rhythm, S1, S2 normal, no murmur, click, rub or gallop GI: soft, non-tender; bowel sounds normal; no masses,  no organomegaly Extremities: extremities normal, atraumatic, no cyanosis or edema Neurologic: Alert and oriented X 3, normal strength and tone. Normal symmetric reflexes. Normal coordination and gait Mouth: reveals thrush  ECOG PERFORMANCE STATUS: 0 - Asymptomatic  Blood pressure 123/71, pulse 80, temperature  97.8 F (36.6 C), temperature source Oral, resp. rate 19, height '5\' 3"'  (1.6 m), weight 158 lb (71.668 kg), SpO2 99.00%.  LABORATORY DATA: Lab Results  Component Value Date   WBC 7.2 07/13/2013   HGB 12.2 07/13/2013   HCT 37.1 07/13/2013   MCV 92.5 07/13/2013   PLT 375 07/13/2013      Chemistry      Component Value Date/Time   NA 142 07/13/2013 0826   NA 138 02/05/2013 1026   K 4.5 07/13/2013 0826   K 4.3 02/05/2013 1026   CL 105 02/05/2013 1026   CL 106 06/30/2012 1004   CO2 23 07/13/2013 0826   CO2 27 02/05/2013 1026   BUN 8.6 07/13/2013 0826   BUN 9 02/05/2013 1026   CREATININE 0.8 07/13/2013 0826   CREATININE 0.6 02/05/2013 1026      Component Value Date/Time   CALCIUM 10.1 07/13/2013 0826    CALCIUM 9.2 02/05/2013 1026   ALKPHOS 79 07/13/2013 0826   ALKPHOS 75 02/05/2013 1026   AST 15 07/13/2013 0826   AST 17 02/05/2013 1026   ALT 11 07/13/2013 0826   ALT 9 02/05/2013 1026   BILITOT 0.31 07/13/2013 0826   BILITOT 0.4 02/05/2013 1026       RADIOGRAPHIC STUDIES: Ct Chest W Contrast  06/18/2013   CLINICAL DATA:  Restaging of non-small cell lung cancer.  EXAM: CT CHEST, ABDOMEN, AND PELVIS WITH CONTRAST  TECHNIQUE: Multidetector CT imaging of the chest, abdomen and pelvis was performed following the standard protocol during bolus administration of intravenous contrast.  CONTRAST:  190m OMNIPAQUE IOHEXOL 300 MG/ML  SOLN  COMPARISON:  04/16/2013  FINDINGS:  CT CHEST FINDINGS  Lungs/Pleura: Probable secretions in the dependent trachea on image 16. Moderate centrilobular emphysema.  Right greater than left biapical pleural parenchymal scarring. Pleural-based opacity at the right upper lobe is unchanged, measuring 11 mm on image 18 of series 5.  Similar peribronchovascular soft tissue thickening in the medial right upper lobe on image 18.  No pleural fluid. Posterior right apical pleural thickening is unchanged, including on image 9.  Heart/Mediastinum: No supraclavicular adenopathy. A right-sided Port-A-Cath which terminates at the high right atrium on this supine exam. Advanced aortic and branch vessel atherosclerosis. Normal heart size, without pericardial effusion. No central pulmonary embolism, on this non-dedicated study. LAD and left circumflex coronary artery atherosclerosis.  No mediastinal or hilar adenopathy.   CT ABDOMEN AND PELVIS FINDINGS  Abdomen/Pelvis: Normal liver, spleen, stomach, pancreas, gallbladder, biliary tract, adrenal glands, kidneys.  Aortic and branch vessel atherosclerosis. No retroperitoneal or retrocrural adenopathy. Scattered colonic diverticula. Normal small bowel without abdominal ascites. No evidence of omental or peritoneal disease.  No pelvic adenopathy. Pelvic floor  laxity. Otherwise, normal urinary bladder and uterus, without adnexal mass or significant free pelvic fluid.  Bones/Musculoskeletal: Tiny sclerotic lesion in the left ischium is similar and likely a bone island. 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 Example coronal image 50 on the right and coronal image 42 on the left. This is unchanged. Prior vertebral augmentation at T4 and T7. T3 severe compression deformity which is similar. Moderate disc bulges at L2-3 and L1-2.    IMPRESSION: CT CHEST IMPRESSION  1. No evidence of recurrent or metastatic disease within the chest. Similar pleural-based right upper lobe opacity which is likely an area of scarring. 2. Moderate centrilobular emphysema. 3. Age advanced coronary artery atherosclerosis. Recommend assessment of coronary risk factors and consideration of medical therapy.  CT ABDOMEN AND PELVIS  IMPRESSION  1. No acute process or evidence of metastatic disease in the abdomen or pelvis. 2. Pelvic floor laxity. 3. 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 4. Prior thoracic vertebral augmentation with similar T3 severe compression deformity.   Electronically Signed   By: Abigail Miyamoto M.D.   On: 06/18/2013 09:16   ASSESSMENT AND PLAN: This is a very pleasant 63 years old white female with metastatic non-small cell lung cancer currently undergoing maintenance chemotherapy with single agent Alimta status post 101 cycles. The patient is tolerating her treatment fairly well with no significant adverse effects.  Her recent CT scan of the chest, abdomen and pelvis showed no evidence for disease progression. Was discussed with and also seen by Dr. Julien Nordmann. She will continue on maintenance chemotherapy with single agent Alimta 500 mg per meter squared given every 3 weeks. She will proceed with cycle #102 today. For pain management, she will continue on her current pain medication with MS Contin and MS IR She will return for a  followup visit in 3 weeks for evaluation before starting cycle #103. A refill prescription for her Zofran was sent to her pharmacy of record via E. scribed in addition to a course of Diflucan to treat the oral candidiasis. Patient knows to hold her cholesterol medication while she is on the Diflucan.  She was advised to call immediately if she has any concerning symptoms in the interval. The patient voices understanding of current disease status and treatment options and is in agreement with the current care plan.  All questions were answered. The patient knows to call the clinic with any problems, questions or concerns. We can certainly see the patient much sooner if necessary.  Carlton Adam PA-C  ADDENDUM: Hematology/Oncology Attending: I had a face to face encounter with the patient today. I recommended her care plan. This is a very pleasant 63 years old white female with metastatic non-small cell lung cancer currently undergoing maintenance chemotherapy with single agent Alimta status post 101 cycles. She is tolerating her treatment fairly well with no significant adverse effects except for mild fatigue a few days after the treatment. She also has some evidence of oral thrush today. I recommended for the patient to continue her current maintenance chemotherapy as planned. We will give her a prescription for Diflucan for oral thrush. She will come back for followup visit in 3 weeks with the next cycle of her chemotherapy. She was advised to call immediately if she has any concerning symptoms in the interval.  Disclaimer: This note was dictated with voice recognition software. Similar sounding words can inadvertently be transcribed and may not be corrected upon review. Eilleen Kempf., MD 07/13/2013

## 2013-07-13 NOTE — Progress Notes (Signed)
07/13/13 @ 8:30 am, BMS [id number], Research Labs and Questionnaires:  Danielle [name] into the [name] for labs, to see Dr. Julien Nordmann and to receive Alimta today.  She completed the EQ-5D-3L and LCSS questionnaires prior to all other activities.  She will have study labs drawn with her [place] labs from her arm.  Confirmed that all questions were answered on both forms and thanked her for her continued support of the study.

## 2013-07-13 NOTE — Telephone Encounter (Signed)
gv pt appt schedule for aug/sept

## 2013-07-13 NOTE — Patient Instructions (Signed)
[facility] Discharge Instructions for Patients Receiving Chemotherapy  Today you received the following chemotherapy agents alimta  To help prevent nausea and vomiting after your treatment, we encourage you to take your nausea medication as directed.    If you develop nausea and vomiting that is not controlled by your nausea medication, call the clinic.   BELOW ARE SYMPTOMS THAT SHOULD BE REPORTED IMMEDIATELY:  *FEVER GREATER THAN 100.5 F  *CHILLS WITH OR WITHOUT FEVER  NAUSEA AND VOMITING THAT IS NOT CONTROLLED WITH YOUR NAUSEA MEDICATION  *UNUSUAL SHORTNESS OF BREATH  *UNUSUAL BRUISING OR BLEEDING  TENDERNESS IN MOUTH AND THROAT WITH OR WITHOUT PRESENCE OF ULCERS  *URINARY PROBLEMS  *BOWEL PROBLEMS  UNUSUAL RASH Items with * indicate a potential emergency and should be followed up as soon as possible.  Feel free to call the clinic you have any questions or concerns. The clinic phone number is [phone].

## 2013-08-03 ENCOUNTER — Ambulatory Visit (HOSPITAL_BASED_OUTPATIENT_CLINIC_OR_DEPARTMENT_OTHER): Payer: Medicare Other | Admitting: Internal Medicine

## 2013-08-03 ENCOUNTER — Telehealth: Payer: Self-pay | Admitting: *Deleted

## 2013-08-03 ENCOUNTER — Ambulatory Visit (HOSPITAL_BASED_OUTPATIENT_CLINIC_OR_DEPARTMENT_OTHER): Payer: Medicare Other

## 2013-08-03 ENCOUNTER — Encounter: Payer: Self-pay | Admitting: *Deleted

## 2013-08-03 ENCOUNTER — Telehealth: Payer: Self-pay | Admitting: Family Medicine

## 2013-08-03 ENCOUNTER — Telehealth: Payer: Self-pay | Admitting: Physician Assistant

## 2013-08-03 ENCOUNTER — Encounter: Payer: Self-pay | Admitting: Internal Medicine

## 2013-08-03 ENCOUNTER — Other Ambulatory Visit (HOSPITAL_BASED_OUTPATIENT_CLINIC_OR_DEPARTMENT_OTHER): Payer: Medicare Other

## 2013-08-03 VITALS — BP 118/68 | HR 105 | Temp 98.3°F | Resp 19 | Ht 63.0 in | Wt 155.4 lb

## 2013-08-03 DIAGNOSIS — C3491 Malignant neoplasm of unspecified part of right bronchus or lung: Secondary | ICD-10-CM

## 2013-08-03 DIAGNOSIS — C7952 Secondary malignant neoplasm of bone marrow: Secondary | ICD-10-CM

## 2013-08-03 DIAGNOSIS — C7949 Secondary malignant neoplasm of other parts of nervous system: Secondary | ICD-10-CM

## 2013-08-03 DIAGNOSIS — E119 Type 2 diabetes mellitus without complications: Secondary | ICD-10-CM

## 2013-08-03 DIAGNOSIS — C3410 Malignant neoplasm of upper lobe, unspecified bronchus or lung: Secondary | ICD-10-CM

## 2013-08-03 DIAGNOSIS — C7951 Secondary malignant neoplasm of bone: Secondary | ICD-10-CM

## 2013-08-03 DIAGNOSIS — C7931 Secondary malignant neoplasm of brain: Secondary | ICD-10-CM

## 2013-08-03 DIAGNOSIS — R52 Pain, unspecified: Secondary | ICD-10-CM | POA: Diagnosis not present

## 2013-08-03 DIAGNOSIS — E7800 Pure hypercholesterolemia, unspecified: Secondary | ICD-10-CM

## 2013-08-03 DIAGNOSIS — C3401 Malignant neoplasm of right main bronchus: Secondary | ICD-10-CM

## 2013-08-03 DIAGNOSIS — Z5111 Encounter for antineoplastic chemotherapy: Secondary | ICD-10-CM

## 2013-08-03 DIAGNOSIS — D649 Anemia, unspecified: Secondary | ICD-10-CM

## 2013-08-03 LAB — CBC WITH DIFFERENTIAL/PLATELET
BASO%: 0.9 % (ref 0.0–2.0)
BASOS ABS: 0.1 10*3/uL (ref 0.0–0.1)
EOS ABS: 0 10*3/uL (ref 0.0–0.5)
EOS%: 0.5 % (ref 0.0–7.0)
HEMATOCRIT: 35 % (ref 34.8–46.6)
HEMOGLOBIN: 11.6 g/dL (ref 11.6–15.9)
LYMPH%: 18.7 % (ref 14.0–49.7)
MCH: 30.8 pg (ref 25.1–34.0)
MCHC: 33.2 g/dL (ref 31.5–36.0)
MCV: 92.9 fL (ref 79.5–101.0)
MONO#: 1 10*3/uL — ABNORMAL HIGH (ref 0.1–0.9)
MONO%: 12.4 % (ref 0.0–14.0)
NEUT#: 5.7 10*3/uL (ref 1.5–6.5)
NEUT%: 67.5 % (ref 38.4–76.8)
PLATELETS: 442 10*3/uL — AB (ref 145–400)
RBC: 3.77 10*6/uL (ref 3.70–5.45)
RDW: 13.4 % (ref 11.2–14.5)
WBC: 8.5 10*3/uL (ref 3.9–10.3)
lymph#: 1.6 10*3/uL (ref 0.9–3.3)

## 2013-08-03 LAB — COMPREHENSIVE METABOLIC PANEL (CC13)
ALT: 7 U/L (ref 0–55)
AST: 13 U/L (ref 5–34)
Albumin: 3.3 g/dL — ABNORMAL LOW (ref 3.5–5.0)
Alkaline Phosphatase: 75 U/L (ref 40–150)
Anion Gap: 9 mEq/L (ref 3–11)
BUN: 17.7 mg/dL (ref 7.0–26.0)
CO2: 26 mEq/L (ref 22–29)
CREATININE: 1.1 mg/dL (ref 0.6–1.1)
Calcium: 10.2 mg/dL (ref 8.4–10.4)
Chloride: 105 mEq/L (ref 98–109)
GLUCOSE: 175 mg/dL — AB (ref 70–140)
Potassium: 4.6 mEq/L (ref 3.5–5.1)
Sodium: 140 mEq/L (ref 136–145)
Total Bilirubin: 0.38 mg/dL (ref 0.20–1.20)
Total Protein: 7.3 g/dL (ref 6.4–8.3)

## 2013-08-03 MED ORDER — MORPHINE SULFATE ER 60 MG PO TBCR: 60.0000 mg | EXTENDED_RELEASE_TABLET | Freq: Two times a day (BID) | ORAL | Status: DC

## 2013-08-03 MED ORDER — DEXAMETHASONE SODIUM PHOSPHATE 10 MG/ML IJ SOLN
10.0000 mg | Freq: Once | INTRAMUSCULAR | Status: AC
  Administered 2013-08-03: 10 mg via INTRAVENOUS

## 2013-08-03 MED ORDER — ONDANSETRON 8 MG/NS 50 ML IVPB
INTRAVENOUS | Status: AC
  Filled 2013-08-03: qty 8

## 2013-08-03 MED ORDER — SODIUM CHLORIDE 0.9 % IJ SOLN
10.0000 mL | INTRAMUSCULAR | Status: DC | PRN
  Administered 2013-08-03: 10 mL
  Filled 2013-08-03: qty 10

## 2013-08-03 MED ORDER — HEPARIN SOD (PORK) LOCK FLUSH 100 UNIT/ML IV SOLN
500.0000 [IU] | Freq: Once | INTRAVENOUS | Status: AC | PRN
  Administered 2013-08-03: 500 [IU]
  Filled 2013-08-03: qty 5

## 2013-08-03 MED ORDER — ONDANSETRON 8 MG/50ML IVPB (CHCC)
8.0000 mg | Freq: Once | INTRAVENOUS | Status: AC
Start: 2013-08-03 — End: 2013-08-03
  Administered 2013-08-03: 8 mg via INTRAVENOUS

## 2013-08-03 MED ORDER — ALPRAZOLAM 0.25 MG PO TABS: 0.2500 mg | ORAL_TABLET | Freq: Every evening | ORAL | Status: DC | PRN

## 2013-08-03 MED ORDER — DEXAMETHASONE SODIUM PHOSPHATE 10 MG/ML IJ SOLN
INTRAMUSCULAR | Status: AC
  Filled 2013-08-03: qty 1

## 2013-08-03 MED ORDER — SODIUM CHLORIDE 0.9 % IV SOLN
500.0000 mg/m2 | Freq: Once | INTRAVENOUS | Status: AC
  Administered 2013-08-03: 900 mg via INTRAVENOUS
  Filled 2013-08-03: qty 36

## 2013-08-03 MED ORDER — SODIUM CHLORIDE 0.9 % IV SOLN
Freq: Once | INTRAVENOUS | Status: AC
  Administered 2013-08-03: 10:00:00 via INTRAVENOUS

## 2013-08-03 MED ORDER — MORPHINE SULFATE 30 MG PO TABS: 30.0000 mg | ORAL_TABLET | ORAL | Status: DC | PRN

## 2013-08-03 NOTE — Telephone Encounter (Signed)
Message copied by [name] on Tue Aug 03, 2013 10:59 PM ------      Message from: Ellamae Sia      Created: Wed Jul 28, 2013  4:15 PM      Regarding: Lab orders for Friday, 7.31.15       Lab orders for a 3 month f/u, include diabetic bundle LDL, Thanks T ------

## 2013-08-03 NOTE — Telephone Encounter (Signed)
Per [facility] staff message scheduled appts. Advised scheduler 

## 2013-08-03 NOTE — Progress Notes (Signed)
08/03/13 @ 8:30 am, BMS [id number], Questionnaires Only:  Danielle [name] into the [facility] for [facility] labs, to see Dr. Julien Nordmann and to receive maintenance therapy with Alimta.  She completed the LCSS and the EQ-5D-3L PROs in the lobby prior to any other activities.  Thanked her for her continued support of the study.

## 2013-08-03 NOTE — Telephone Encounter (Signed)
Pt confirmed labs/ov per 07/28 [facility], msg for chemo added and gave pt AVS..Marland KitchenKJ

## 2013-08-03 NOTE — Progress Notes (Signed)
[name] [phone]   [phone]  OFFICE PROGRESS NOTE  [name], MD [name]. [name] [address]  Diagnosis:  Metastatic non-small cell lung cancer initially diagnosed with locally advanced stage IIIB with right Pancoast tumor involving the vertebral body as well as the foraminal canal invasion with spinal cord compression in October of 2007.   Prior Therapy:  1. Status post concurrent chemoradiation with weekly carboplatin and paclitaxel, last dose was given November 18, 2005. 2. 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 3. Status post gamma knife radiotherapy to a solitary brain lesion located in the superior frontal area of the brain at [facility] in April of 2008. 4. Status post palliative radiotherapy to the lateral abdominal wall metastatic lesion under the care of Dr. Lisbeth Renshaw, completed March of 2009. 5. Status post 6 cycles of systemic chemotherapy with carboplatin and Alimta. Last dose was given July 26, 2007 with disease stabilization. 6. Gamma knife stereotactic radiotherapy to 2 brain lesions one involving the right frontal dural based as well as right parietal lesion performed on 05/07/2012 under the care of Dr. Lilyan Punt at [facility].  Current Therapy:  Maintenance chemotherapy with Alimta 500 mg per meter squared given every 3 weeks. The patient is status post 102 cycles.   CHEMOTHERAPY INTENT: Palliative/maintenance  CURRENT # OF CHEMOTHERAPY CYCLES: 103 CURRENT ANTIEMETICS: Zofran, dexamethasone and Compazine  CURRENT SMOKING STATUS: Quit smoking 10/07/2005  ORAL CHEMOTHERAPY AND CONSENT: None  CURRENT BISPHOSPHONATES USE: None  PAIN MANAGEMENT: 0/10 currently on morphine  NARCOTICS INDUCED CONSTIPATION: No constipation  LIVING WILL AND CODE STATUS: No CODE BLUE   INTERVAL  HISTORY: Danielle [name] 63 y.o. female returns to the clinic today for followup visit accompanied by her sister. She completed 102 cycle of maintenance Alimta 3 weeks ago. The patient is feeling fine today with no specific complaints. She is tolerating her maintenance treatment with single agent Alimta fairly well with no significant adverse effects. The patient denied having any chest pain, shortness of breath, cough or hemoptysis. She denied having any fever or chills, no nausea or vomiting. The patient denied having any weight loss or night sweats.   MEDICAL HISTORY: Past Medical History  Diagnosis Date  . Hypertension   . Anemia   . H/O: pneumonia   . History of tobacco abuse quit 10/08    on nicotine patch  . Fibromyalgia   . Hypokalemia   . DJD (degenerative joint disease), cervical   . Thrush 12/11/2010  . 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 had chemo and radiation for lung ca    ALLERGIES:  is allergic to contrast media; co-trimoxazole injection; iohexol; and sulfa drugs cross reactors.  MEDICATIONS:  Current Outpatient Prescriptions  Medication Sig Dispense Refill  . ALPRAZolam (XANAX) 0.25 MG tablet Take 1 tablet (0.25 mg total) by mouth at bedtime as needed for sleep.  30 tablet  0  . amLODipine (NORVASC) 10 MG tablet Take 1 tablet (10 mg total) by mouth daily.  90 tablet  3  . antipyrine-benzocaine (AURALGAN) otic solution Place 1 drop into both ears daily.  10 mL  1  . fluticasone (FLONASE) 50 MCG/ACT nasal spray Place 1-2 sprays into the nose daily.  16 g  11  . folic acid (FOLVITE) 1 MG tablet TAKE 1 TABLET BY  MOUTH DAILY.  90 tablet  3  . lidocaine-prilocaine (EMLA) cream Apply topically as needed.  30 g  1  . lisinopril (PRINIVIL,ZESTRIL) 20 MG tablet TAKE 2 TABLETS (40 MG TOTAL) BY MOUTH DAILY.  60 tablet  5  . morphine (MS CONTIN) 60 MG 12 hr tablet Take 1 tablet (60 mg total) by mouth 2 (two) times daily.  60 tablet  0  .  morphine (MSIR) 30 MG tablet Take 1 tablet (30 mg total) by mouth every 4 (four) hours as needed.  60 tablet  0  . olopatadine (PATANOL) 0.1 % ophthalmic solution Place 1 drop into both eyes 2 (two) times daily.  5 mL  11  . ondansetron (ZOFRAN) 8 MG tablet TAKE 1 TABLET (8 MG TOTAL) BY MOUTH EVERY 8 (EIGHT) HOURS AS NEEDED FOR NAUSEA.  30 tablet  2  . simvastatin (ZOCOR) 20 MG tablet Take 1 tablet (20 mg total) by mouth daily.  90 tablet  3  . predniSONE (DELTASONE) 50 MG tablet 13 hrs, 7 hrs, and 1 hr before scheduled CT scans       No current facility-administered medications for this visit.    SURGICAL HISTORY:  Past Surgical History  Procedure Laterality Date  . Cervical laminectomy  1995  . Other surgical history  2008    Gamma knife surgery to remove brain met   . 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 Knee surgery  1990    Left x 2  . Portacath placement  -[name]    TIP IN CAVOATRIAL JUNCTION    REVIEW OF SYSTEMS:  Constitutional: negative Eyes: negative Ears, nose, mouth, throat, and face: negative Respiratory: negative Cardiovascular: negative Gastrointestinal: negative Genitourinary:negative Integument/breast: negative Hematologic/lymphatic: negative Musculoskeletal:negative Neurological: negative Behavioral/Psych: negative Endocrine: negative Allergic/Immunologic: negative   PHYSICAL EXAMINATION: General appearance: alert, cooperative and no distress Head: Normocephalic, without obvious abnormality, atraumatic Neck: no adenopathy, no JVD, supple, symmetrical, trachea midline and thyroid not enlarged, symmetric, no tenderness/mass/nodules Lymph nodes: Cervical, supraclavicular, and axillary nodes normal. Resp: clear to auscultation bilaterally Back: symmetric, no curvature. ROM normal. No CVA tenderness. Cardio: regular rate and rhythm, S1, S2 normal, no murmur, click, rub or gallop GI: soft, non-tender; bowel sounds normal; no  masses,  no organomegaly Extremities: extremities normal, atraumatic, no cyanosis or edema Neurologic: Alert and oriented X 3, normal strength and tone. Normal symmetric reflexes. Normal coordination and gait  ECOG PERFORMANCE STATUS: 0 - Asymptomatic  Blood pressure 118/68, pulse 105, temperature 98.3 F (36.8 C), temperature source Oral, resp. rate 19, height '5\' 3"'  (1.6 m), weight 155 lb 6.4 oz (70.489 kg).  LABORATORY DATA: Lab Results  Component Value Date   WBC 8.5 08/03/2013   HGB 11.6 08/03/2013   HCT 35.0 08/03/2013   MCV 92.9 08/03/2013   PLT 442* 08/03/2013      Chemistry      Component Value Date/Time   NA 142 07/13/2013 0826   NA 138 02/05/2013 1026   K 4.5 07/13/2013 0826   K 4.3 02/05/2013 1026   CL 105 02/05/2013 1026   CL 106 06/30/2012 1004   CO2 23 07/13/2013 0826   CO2 27 02/05/2013 1026   BUN 8.6 07/13/2013 0826   BUN 9 02/05/2013 1026   CREATININE 0.8 07/13/2013 0826   CREATININE 0.6 02/05/2013 1026      Component Value Date/Time   CALCIUM 10.1 07/13/2013 0826   CALCIUM 9.2 02/05/2013 1026  ALKPHOS 79 07/13/2013 0826   ALKPHOS 75 02/05/2013 1026   AST 15 07/13/2013 0826   AST 17 02/05/2013 1026   ALT 11 07/13/2013 0826   ALT 9 02/05/2013 1026   BILITOT 0.31 07/13/2013 0826   BILITOT 0.4 02/05/2013 1026       RADIOGRAPHIC STUDIES:  ASSESSMENT AND PLAN: This is a very pleasant 63 years old white female with metastatic non-small cell lung cancer currently undergoing maintenance chemotherapy with single agent Alimta status post 102 cycles. The patient is tolerating her treatment fairly well with no significant adverse effects.  I recommended for her to continue her current treatment with maintenance Alimta as scheduled. She will proceed with cycle #103 today. For pain management, she will continue on her current pain medication with MS Contin and MS IR and she was given a refill of this medication in addition to Xanax. The patient would come back for followup visit in 3 weeks for  evaluation before starting cycle #104 after repeating CT scan of the chest, abdomen and pelvis for restaging of her disease. She was advised to call immediately if she has any concerning symptoms in the interval. The patient voices understanding of current disease status and treatment options and is in agreement with the current care plan.  All questions were answered. The patient knows to call the clinic with any problems, questions or concerns. We can certainly see the patient much sooner if necessary.  Disclaimer: This note was dictated with voice recognition software. Similar sounding words can inadvertently be transcribed and may not be corrected upon review.

## 2013-08-03 NOTE — Patient Instructions (Signed)
[place] [facility] Discharge Instructions for Patients Receiving Chemotherapy  Today you received the following chemotherapy agents Alimta.  To help prevent nausea and vomiting after your treatment, we encourage you to take your nausea medication as prescribed.   If you develop nausea and vomiting that is not controlled by your nausea medication, call the clinic.   BELOW ARE SYMPTOMS THAT SHOULD BE REPORTED IMMEDIATELY:  *FEVER GREATER THAN 100.5 F  *CHILLS WITH OR WITHOUT FEVER  NAUSEA AND VOMITING THAT IS NOT CONTROLLED WITH YOUR NAUSEA MEDICATION  *UNUSUAL SHORTNESS OF BREATH  *UNUSUAL BRUISING OR BLEEDING  TENDERNESS IN MOUTH AND THROAT WITH OR WITHOUT PRESENCE OF ULCERS  *URINARY PROBLEMS  *BOWEL PROBLEMS  UNUSUAL RASH Items with * indicate a potential emergency and should be followed up as soon as possible.  Feel free to call the clinic you have any questions or concerns. The clinic phone number is [phone].

## 2013-08-06 ENCOUNTER — Other Ambulatory Visit (INDEPENDENT_AMBULATORY_CARE_PROVIDER_SITE_OTHER): Payer: Medicare Other

## 2013-08-06 DIAGNOSIS — E119 Type 2 diabetes mellitus without complications: Secondary | ICD-10-CM | POA: Diagnosis not present

## 2013-08-06 DIAGNOSIS — E7800 Pure hypercholesterolemia, unspecified: Secondary | ICD-10-CM

## 2013-08-06 LAB — LIPID PANEL
CHOL/HDL RATIO: 6
Cholesterol: 163 mg/dL (ref 0–200)
HDL: 25.9 mg/dL — ABNORMAL LOW (ref >39.00)
NONHDL: 137.1
Triglycerides: 272 mg/dL — ABNORMAL HIGH (ref 0.0–149.0)
VLDL: 54.4 mg/dL — ABNORMAL HIGH (ref 0.0–40.0)

## 2013-08-06 LAB — LDL CHOLESTEROL, DIRECT: Direct LDL: 105.5 mg/dL

## 2013-08-06 LAB — HEMOGLOBIN A1C: Hgb A1c MFr Bld: 5.9 % (ref 4.6–6.5)

## 2013-08-13 ENCOUNTER — Encounter: Payer: Self-pay | Admitting: Family Medicine

## 2013-08-13 ENCOUNTER — Ambulatory Visit (INDEPENDENT_AMBULATORY_CARE_PROVIDER_SITE_OTHER): Payer: Medicare Other | Admitting: Family Medicine

## 2013-08-13 VITALS — BP 122/57 | HR 88 | Temp 98.3°F | Ht 63.0 in | Wt 157.2 lb

## 2013-08-13 DIAGNOSIS — I10 Essential (primary) hypertension: Secondary | ICD-10-CM

## 2013-08-13 DIAGNOSIS — E7800 Pure hypercholesterolemia, unspecified: Secondary | ICD-10-CM

## 2013-08-13 DIAGNOSIS — I700 Atherosclerosis of aorta: Secondary | ICD-10-CM | POA: Diagnosis not present

## 2013-08-13 DIAGNOSIS — E119 Type 2 diabetes mellitus without complications: Secondary | ICD-10-CM | POA: Diagnosis not present

## 2013-08-13 LAB — HM DIABETES FOOT EXAM

## 2013-08-13 NOTE — Assessment & Plan Note (Signed)
Well controlled. Continue current medication.  

## 2013-08-13 NOTE — Patient Instructions (Signed)
Work on [employer], continue current meds. Return in 3 months for DM, chol appt with labs prior. If chol not at goal we will change to a stronger medication to lower LDL to 70.

## 2013-08-13 NOTE — Assessment & Plan Note (Signed)
Well controlled on no med. 

## 2013-08-13 NOTE — Progress Notes (Signed)
Subjective:    Patient ID: Danielle [name], female    DOB: [birth year], 63 y.o.   MRN: [id number]  Diabetes Pertinent negatives for diabetes include no chest pain and no fatigue.    63 year old female on 103rd!!! chemo treatment for met lung cancer presents for 3 month follow up DM bundle.  At last OV her usually well controlled chol and BP were abnormal. She was restarted on lisinopril, amlodipine and lower dose simvastatin.  Hypertension: Improved control on amlodipine 10 and lisiniopril 20 daily BP Readings from Last 3 Encounters:  08/13/13 122/57  08/03/13 118/68  07/13/13 123/71  Using medication without problems or lightheadedness: None Chest pain with exertion: None, occ heartburn after chemo but gone with pepcid Edema:None Short of breath:Stable Average home BPs:120/70 Other issues:  She has been working on healthy eating.  Diabetes: Well controlled on no med. Lab Results  Component Value Date   HGBA1C 5.9 08/06/2013  Using medications without difficulties: None Hypoglycemic episodes: Not checking Hyperglycemic episodes:? Feet problems:None eye exam within last year: Due  Elevated Cholesterol:  LDL almost at goal back on simvastatin at lower 20 mg dose. She has actually been off chol med in last 16 days since on nystatin for thrush.  *At last chest CT: atherosclerosis seen in aortic and  branch vessels.  Asymptomatic. Pt does no want further eval for CAD. We will lower her LDL goal to 70.  Lab Results  Component Value Date   CHOL 163 08/06/2013   HDL 25.90* 08/06/2013   LDLCALC 163* 05/06/2013   LDLDIRECT 105.5 08/06/2013   TRIG 272.0* 08/06/2013   CHOLHDL 6 08/06/2013  Using medications without problems: None Muscle aches: None Diet compliance: Good Exercise: Good Other complaints:    Review of Systems  Constitutional: Negative for fever and fatigue.  HENT: Negative for ear pain.   Eyes: Negative for pain.  Respiratory: Negative for chest tightness.     Cardiovascular: Negative for chest pain, palpitations and leg swelling.  Gastrointestinal: Negative for abdominal pain.  Genitourinary: Negative for dysuria.       Objective:   Physical Exam  Constitutional: Vital signs are normal. She appears well-developed and well-nourished. She is cooperative.  Non-toxic appearance. She does not appear ill. No distress.  HENT:  Head: Normocephalic.  Right Ear: Hearing, tympanic membrane, external ear and ear canal normal. Tympanic membrane is not erythematous, not retracted and not bulging.  Left Ear: Hearing, tympanic membrane, external ear and ear canal normal. Tympanic membrane is not erythematous, not retracted and not bulging.  Nose: No mucosal edema or rhinorrhea. Right sinus exhibits no maxillary sinus tenderness and no frontal sinus tenderness. Left sinus exhibits no maxillary sinus tenderness and no frontal sinus tenderness.  Mouth/Throat: Uvula is midline, oropharynx is clear and moist and mucous membranes are normal.  Eyes: Conjunctivae, EOM and lids are normal. Pupils are equal, round, and reactive to light. Lids are everted and swept, no foreign bodies found.  Neck: Trachea normal and normal range of motion. Neck supple. Carotid bruit is not present. No mass and no thyromegaly present.  Cardiovascular: Normal rate, regular rhythm, S1 normal, S2 normal, normal heart sounds, intact distal pulses and normal pulses.  Exam reveals no gallop and no friction rub.   No murmur heard. Pulmonary/Chest: Effort normal. Not tachypneic. No respiratory distress. She has decreased breath sounds. She has no wheezes. She has no rhonchi. She has no rales.  Unchanged from previous exam  Abdominal: Soft. Normal appearance and  bowel sounds are normal. There is no tenderness.  Neurological: She is alert.  Skin: Skin is warm, dry and intact. No rash noted.  Psychiatric: Her speech is normal and behavior is normal. Judgment and thought content normal. Her mood  appears not anxious. Cognition and memory are normal. She does not exhibit a depressed mood.   Diabetic foot exam: Normal inspection No skin breakdown No calluses  Normal DP pulses Normal sensation to light touch and monofilament Nails normal         Assessment & Plan:

## 2013-08-13 NOTE — Progress Notes (Signed)
Pre visit review using our clinic review tool, if applicable. No additional management support is needed unless otherwise documented below in the visit note. 

## 2013-08-13 NOTE — Assessment & Plan Note (Signed)
iven advanced atherosclerosis seen in vessels on  CT will lower LDL goal to < 70.  Pt will work on diet and start back simvastatin... recheck in 3 months... If not at goal LDL will change to atorvastatin so we can increase to higher dose given pt on amlodipine.

## 2013-08-19 ENCOUNTER — Other Ambulatory Visit: Payer: Self-pay | Admitting: Physician Assistant

## 2013-08-19 DIAGNOSIS — C3490 Malignant neoplasm of unspecified part of unspecified bronchus or lung: Secondary | ICD-10-CM

## 2013-08-20 ENCOUNTER — Encounter (HOSPITAL_COMMUNITY): Payer: Self-pay

## 2013-08-20 ENCOUNTER — Ambulatory Visit (HOSPITAL_COMMUNITY)
Admission: RE | Admit: 2013-08-20 | Discharge: 2013-08-20 | Disposition: A | Discharge status: Home / Self Care | Payer: Medicare Other | Source: Ambulatory Visit | Attending: Internal Medicine | Admitting: Internal Medicine

## 2013-08-20 DIAGNOSIS — C3410 Malignant neoplasm of upper lobe, unspecified bronchus or lung: Secondary | ICD-10-CM | POA: Insufficient documentation

## 2013-08-20 DIAGNOSIS — C3490 Malignant neoplasm of unspecified part of unspecified bronchus or lung: Secondary | ICD-10-CM | POA: Diagnosis not present

## 2013-08-20 MED ORDER — IOHEXOL 300 MG/ML  SOLN
100.0000 mL | Freq: Once | INTRAMUSCULAR | Status: AC | PRN
  Administered 2013-08-20: 100 mL via INTRAVENOUS

## 2013-08-24 ENCOUNTER — Other Ambulatory Visit (HOSPITAL_BASED_OUTPATIENT_CLINIC_OR_DEPARTMENT_OTHER): Payer: Medicare Other

## 2013-08-24 ENCOUNTER — Telehealth: Payer: Self-pay | Admitting: Internal Medicine

## 2013-08-24 ENCOUNTER — Encounter: Payer: Self-pay | Admitting: Physician Assistant

## 2013-08-24 ENCOUNTER — Ambulatory Visit (HOSPITAL_BASED_OUTPATIENT_CLINIC_OR_DEPARTMENT_OTHER): Payer: Medicare Other

## 2013-08-24 ENCOUNTER — Ambulatory Visit (HOSPITAL_BASED_OUTPATIENT_CLINIC_OR_DEPARTMENT_OTHER): Payer: Medicare Other | Admitting: Physician Assistant

## 2013-08-24 ENCOUNTER — Encounter: Payer: Self-pay | Admitting: Oncology

## 2013-08-24 VITALS — BP 139/83 | HR 106 | Temp 98.1°F | Resp 18 | Ht 63.0 in | Wt 157.1 lb

## 2013-08-24 DIAGNOSIS — C7949 Secondary malignant neoplasm of other parts of nervous system: Secondary | ICD-10-CM

## 2013-08-24 DIAGNOSIS — C3410 Malignant neoplasm of upper lobe, unspecified bronchus or lung: Secondary | ICD-10-CM

## 2013-08-24 DIAGNOSIS — C7952 Secondary malignant neoplasm of bone marrow: Secondary | ICD-10-CM

## 2013-08-24 DIAGNOSIS — C3401 Malignant neoplasm of right main bronchus: Secondary | ICD-10-CM

## 2013-08-24 DIAGNOSIS — C7951 Secondary malignant neoplasm of bone: Secondary | ICD-10-CM

## 2013-08-24 DIAGNOSIS — C3490 Malignant neoplasm of unspecified part of unspecified bronchus or lung: Secondary | ICD-10-CM

## 2013-08-24 DIAGNOSIS — C7931 Secondary malignant neoplasm of brain: Secondary | ICD-10-CM

## 2013-08-24 DIAGNOSIS — B370 Candidal stomatitis: Secondary | ICD-10-CM

## 2013-08-24 DIAGNOSIS — Z5111 Encounter for antineoplastic chemotherapy: Secondary | ICD-10-CM

## 2013-08-24 DIAGNOSIS — C3491 Malignant neoplasm of unspecified part of right bronchus or lung: Secondary | ICD-10-CM

## 2013-08-24 LAB — COMPREHENSIVE METABOLIC PANEL (CC13)
ALK PHOS: 71 U/L (ref 40–150)
ALT: 9 U/L (ref 0–55)
AST: 14 U/L (ref 5–34)
Albumin: 3.5 g/dL (ref 3.5–5.0)
Anion Gap: 9 mEq/L (ref 3–11)
BUN: 7.3 mg/dL (ref 7.0–26.0)
CO2: 26 mEq/L (ref 22–29)
CREATININE: 0.8 mg/dL (ref 0.6–1.1)
Calcium: 9.9 mg/dL (ref 8.4–10.4)
Chloride: 108 mEq/L (ref 98–109)
Glucose: 131 mg/dl (ref 70–140)
Potassium: 4.1 mEq/L (ref 3.5–5.1)
SODIUM: 142 meq/L (ref 136–145)
TOTAL PROTEIN: 7.3 g/dL (ref 6.4–8.3)
Total Bilirubin: <0.2 mg/dL (ref 0.20–1.20)

## 2013-08-24 LAB — CBC WITH DIFFERENTIAL/PLATELET
BASO%: 0.7 % (ref 0.0–2.0)
Basophils Absolute: 0.1 10*3/uL (ref 0.0–0.1)
EOS%: 0.5 % (ref 0.0–7.0)
Eosinophils Absolute: 0 10*3/uL (ref 0.0–0.5)
HCT: 37.3 % (ref 34.8–46.6)
HGB: 12.4 g/dL (ref 11.6–15.9)
LYMPH%: 21.1 % (ref 14.0–49.7)
MCH: 30.4 pg (ref 25.1–34.0)
MCHC: 33.2 g/dL (ref 31.5–36.0)
MCV: 91.4 fL (ref 79.5–101.0)
MONO#: 0.9 10*3/uL (ref 0.1–0.9)
MONO%: 10.1 % (ref 0.0–14.0)
NEUT#: 6 10*3/uL (ref 1.5–6.5)
NEUT%: 67.6 % (ref 38.4–76.8)
PLATELETS: 478 10*3/uL — AB (ref 145–400)
RBC: 4.08 10*6/uL (ref 3.70–5.45)
RDW: 13.8 % (ref 11.2–14.5)
WBC: 8.8 10*3/uL (ref 3.9–10.3)
lymph#: 1.9 10*3/uL (ref 0.9–3.3)

## 2013-08-24 LAB — RESEARCH LABS

## 2013-08-24 MED ORDER — SODIUM CHLORIDE 0.9 % IV SOLN
Freq: Once | INTRAVENOUS | Status: AC
  Administered 2013-08-24: 10:00:00 via INTRAVENOUS

## 2013-08-24 MED ORDER — ONDANSETRON 8 MG/50ML IVPB (CHCC)
8.0000 mg | Freq: Once | INTRAVENOUS | Status: AC
  Administered 2013-08-24: 8 mg via INTRAVENOUS

## 2013-08-24 MED ORDER — HEPARIN SOD (PORK) LOCK FLUSH 100 UNIT/ML IV SOLN
500.0000 [IU] | Freq: Once | INTRAVENOUS | Status: AC | PRN
  Administered 2013-08-24: 500 [IU]
  Filled 2013-08-24: qty 5

## 2013-08-24 MED ORDER — DEXAMETHASONE SODIUM PHOSPHATE 10 MG/ML IJ SOLN
INTRAMUSCULAR | Status: AC
  Filled 2013-08-24: qty 1

## 2013-08-24 MED ORDER — SODIUM CHLORIDE 0.9 % IV SOLN
500.0000 mg/m2 | Freq: Once | INTRAVENOUS | Status: AC
  Administered 2013-08-24: 900 mg via INTRAVENOUS
  Filled 2013-08-24: qty 36

## 2013-08-24 MED ORDER — SODIUM CHLORIDE 0.9 % IJ SOLN
10.0000 mL | INTRAMUSCULAR | Status: DC | PRN
  Administered 2013-08-24: 10 mL
  Filled 2013-08-24: qty 10

## 2013-08-24 MED ORDER — ONDANSETRON 8 MG/NS 50 ML IVPB
INTRAVENOUS | Status: AC
  Filled 2013-08-24: qty 8

## 2013-08-24 MED ORDER — FLUCONAZOLE 100 MG PO TABS: ORAL_TABLET | ORAL | Status: DC

## 2013-08-24 MED ORDER — DEXAMETHASONE SODIUM PHOSPHATE 10 MG/ML IJ SOLN
10.0000 mg | Freq: Once | INTRAMUSCULAR | Status: AC
  Administered 2013-08-24: 10 mg via INTRAVENOUS

## 2013-08-24 NOTE — Progress Notes (Signed)
08/24/13 - 9:00 am BMS [id number], Research Labs and Questionnaires: Danielle [name] into the [facility] for labs,  to see [name] and to receive chemo today. She completed the EQ-5D-3L and LCSS questionnaires prior to all other activities. She will have study labs drawn with her [facility] labs from her arm. Confirmed that all questions were answered on both forms and thanked her for her continued support of the study.

## 2013-08-24 NOTE — Telephone Encounter (Signed)
gv pt new sched pt wanted earlier appts....done

## 2013-08-24 NOTE — Patient Instructions (Signed)
[facility] Discharge Instructions for Patients Receiving Chemotherapy  Today you received the following chemotherapy agents: Alimta  To help prevent nausea and vomiting after your treatment, we encourage you to take your nausea medication as prescribed by your physician.    If you develop nausea and vomiting that is not controlled by your nausea medication, call the clinic.   BELOW ARE SYMPTOMS THAT SHOULD BE REPORTED IMMEDIATELY:  *FEVER GREATER THAN 100.5 F  *CHILLS WITH OR WITHOUT FEVER  NAUSEA AND VOMITING THAT IS NOT CONTROLLED WITH YOUR NAUSEA MEDICATION  *UNUSUAL SHORTNESS OF BREATH  *UNUSUAL BRUISING OR BLEEDING  TENDERNESS IN MOUTH AND THROAT WITH OR WITHOUT PRESENCE OF ULCERS  *URINARY PROBLEMS  *BOWEL PROBLEMS  UNUSUAL RASH Items with * indicate a potential emergency and should be followed up as soon as possible.  Feel free to call the clinic you have any questions or concerns. The clinic phone number is [phone].

## 2013-08-24 NOTE — Progress Notes (Addendum)
[place] [phone]   [phone]  SHARED VISIT PROGRESS NOTE  [name], MD [address]. [name] [address]  Diagnosis:  Metastatic non-small cell lung cancer initially diagnosed with locally advanced stage IIIB with right Pancoast tumor involving the vertebral body as well as the foraminal canal invasion with spinal cord compression in October of 2007.   Prior Therapy:  1. Status post concurrent chemoradiation with weekly carboplatin and paclitaxel, last dose was given November 18, 2005. 2. 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 3. Status post gamma knife radiotherapy to a solitary brain lesion located in the superior frontal area of the brain at [facility] in April of 2008. 4. Status post palliative radiotherapy to the lateral abdominal wall metastatic lesion under the care of Dr. Lisbeth Renshaw, completed March of 2009. 5. Status post 6 cycles of systemic chemotherapy with carboplatin and Alimta. Last dose was given July 26, 2007 with disease stabilization. 6. Gamma knife stereotactic radiotherapy to 2 brain lesions one involving the right frontal dural based as well as right parietal lesion performed on 05/07/2012 under the care of Dr. Lilyan Punt at [facility].  Current Therapy:  Maintenance chemotherapy with Alimta 500 mg per meter squared given every 3 weeks. The patient is status post 103 cycles.   CHEMOTHERAPY INTENT: Palliative/maintenance  CURRENT # OF CHEMOTHERAPY CYCLES: 104 CURRENT ANTIEMETICS: Zofran, dexamethasone and Compazine  CURRENT SMOKING STATUS: Quit smoking 10/07/2005  ORAL CHEMOTHERAPY AND CONSENT: None  CURRENT BISPHOSPHONATES USE: None  PAIN MANAGEMENT: 0/10 currently on morphine  NARCOTICS INDUCED CONSTIPATION: No constipation  LIVING WILL AND CODE STATUS: No CODE BLUE   INTERVAL  HISTORY: MAHOGONY GILCHREST 63 y.o. female returns to the clinic today for followup visit accompanied by her sister. She  Has completed 103 cycles of maintenance Alimta 3 weeks ago. The patient is feeling fine today with no specific complaints. She is tolerating her maintenance treatment with single agent Alimta fairly well with no significant adverse effects. The patient denied having any chest pain, shortness of breath, cough or hemoptysis. She denied having any fever or chills, no nausea or vomiting. The patient denied having any weight loss or night sweats. She had recent restaging CT scan of the chest, abdomen and pelvis and presents to discuss the results. She also presents to proceed with cycle #104 of her maintenance chemotherapy with single agent Alimta.  MEDICAL HISTORY: Past Medical History  Diagnosis Date  . Hypertension   . Anemia   . H/O: pneumonia   . History of tobacco abuse quit 10/08    on nicotine patch  . Fibromyalgia   . Hypokalemia   . DJD (degenerative joint disease), cervical   . Thrush 12/11/2010  . 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 had chemo and radiation for lung ca    ALLERGIES:  is allergic to contrast media; co-trimoxazole injection; iohexol; and sulfa drugs cross reactors.  MEDICATIONS:  Current Outpatient Prescriptions  Medication Sig Dispense Refill  . ALPRAZolam (XANAX) 0.25 MG tablet Take 1 tablet (0.25 mg total) by mouth at bedtime as needed for sleep.  30 tablet  0  . amLODipine (NORVASC) 10 MG tablet Take 1 tablet (10 mg total) by mouth daily.  90 tablet  3  . antipyrine-benzocaine (AURALGAN) otic solution Place 1 drop into both ears daily.  10 mL  1  . fluticasone (FLONASE) 50 MCG/ACT nasal spray Place 1-2 sprays into the nose daily.  16 g  11  . folic acid (FOLVITE) 1 MG tablet TAKE 1 TABLET BY MOUTH DAILY.  90 tablet  1  . lidocaine-prilocaine (EMLA) cream Apply topically as needed.  30 g  1  . lisinopril  (PRINIVIL,ZESTRIL) 20 MG tablet TAKE 2 TABLETS (40 MG TOTAL) BY MOUTH DAILY.  60 tablet  5  . morphine (MS CONTIN) 60 MG 12 hr tablet Take 1 tablet (60 mg total) by mouth 2 (two) times daily.  60 tablet  0  . morphine (MSIR) 30 MG tablet Take 1 tablet (30 mg total) by mouth every 4 (four) hours as needed.  60 tablet  0  . olopatadine (PATANOL) 0.1 % ophthalmic solution Place 1 drop into both eyes 2 (two) times daily.  5 mL  11  . ondansetron (ZOFRAN) 8 MG tablet TAKE 1 TABLET (8 MG TOTAL) BY MOUTH EVERY 8 (EIGHT) HOURS AS NEEDED FOR NAUSEA.  30 tablet  2  . predniSONE (DELTASONE) 50 MG tablet 13 hrs, 7 hrs, and 1 hr before scheduled CT scans      . simvastatin (ZOCOR) 20 MG tablet Take 1 tablet (20 mg total) by mouth daily.  90 tablet  3   No current facility-administered medications for this visit.    SURGICAL HISTORY:  Past Surgical History  Procedure Laterality Date  . Cervical laminectomy  1995  . Other surgical history  2008    Gamma knife surgery to remove brain met   . 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 Knee surgery  1990    Left x 2  . Portacath placement  -[name]    TIP IN CAVOATRIAL JUNCTION    REVIEW OF SYSTEMS:  Constitutional: negative Eyes: negative Ears, nose, mouth, throat, and face: negative Respiratory: negative Cardiovascular: negative Gastrointestinal: negative Genitourinary:negative Integument/breast: negative Hematologic/lymphatic: negative Musculoskeletal:negative Neurological: negative Behavioral/Psych: negative Endocrine: negative Allergic/Immunologic: negative   PHYSICAL EXAMINATION: General appearance: alert, cooperative and no distress Head: Normocephalic, without obvious abnormality, atraumatic Neck: no adenopathy, no JVD, supple, symmetrical, trachea midline and thyroid not enlarged, symmetric, no tenderness/mass/nodules Lymph nodes: Cervical, supraclavicular, and axillary nodes normal. Resp: clear to  auscultation bilaterally Back: symmetric, no curvature. ROM normal. No CVA tenderness. Cardio: regular rate and rhythm, S1, S2 normal, no murmur, click, rub or gallop GI: soft, non-tender; bowel sounds normal; no masses,  no organomegaly Extremities: extremities normal, atraumatic, no cyanosis or edema Neurologic: Alert and oriented X 3, normal strength and tone. Normal symmetric reflexes. Normal coordination and gait Mouth: Reveals moderate thrush  ECOG PERFORMANCE STATUS: 0 - Asymptomatic  Blood pressure 139/83, pulse 106, temperature 98.1 F (36.7 C), temperature source Oral, resp. rate 18, height '5\' 3"'  (1.6 m), weight 157 lb 1.6 oz (71.26 kg), SpO2 98.00%.  LABORATORY DATA: Lab Results  Component Value Date   WBC 8.8 08/24/2013   HGB 12.4 08/24/2013   HCT 37.3 08/24/2013   MCV 91.4 08/24/2013   PLT 478* 08/24/2013      Chemistry      Component Value Date/Time   NA 140 08/03/2013 0822   NA 138 02/05/2013 1026   K 4.6 08/03/2013 0822   K 4.3 02/05/2013 1026   CL 105 02/05/2013 1026   CL 106 06/30/2012 1004   CO2 26 08/03/2013 0822   CO2 27 02/05/2013 1026   BUN 17.7  08/03/2013 0822   BUN 9 02/05/2013 1026   CREATININE 1.1 08/03/2013 0822   CREATININE 0.6 02/05/2013 1026      Component Value Date/Time   CALCIUM 10.2 08/03/2013 0822   CALCIUM 9.2 02/05/2013 1026   ALKPHOS 75 08/03/2013 0822   ALKPHOS 75 02/05/2013 1026   AST 13 08/03/2013 0822   AST 17 02/05/2013 1026   ALT 7 08/03/2013 0822   ALT 9 02/05/2013 1026   BILITOT 0.38 08/03/2013 0822   BILITOT 0.4 02/05/2013 1026       RADIOGRAPHIC STUDIES: Ct Chest W Contrast  08/20/2013   CLINICAL DATA:  Patient with history of non-small cell lung cancer.  EXAM: CT CHEST, ABDOMEN, AND PELVIS WITH CONTRAST  TECHNIQUE: Multidetector CT imaging of the chest, abdomen and pelvis was performed following the standard protocol during bolus administration of intravenous contrast.  CONTRAST:  145m OMNIPAQUE IOHEXOL 300 MG/ML  SOLN  COMPARISON:  CT  06/18/2013  FINDINGS: CT CHEST FINDINGS  Visualized neck base is unremarkable. No axillary, mediastinal or hilar lymphadenopathy. Normal heart size. No pericardial effusion. Coronary arterial calcifications. Right anterior chest wall Port-A-Cath is present with tip projecting at the right superior cavoatrial junction.  The central airways are patent. Dependent atelectasis within the right lower lobe. Stable 1.1 cm subpleural opacity within the anterior right upper lobe (image 17; series 5). Unchanged right-greater-than-left biapical pleural parenchymal scarring. Unchanged peribronchovascular soft tissue within medial right upper lobe (image 17; series 5). Posterior right apical pleural thickening unchanged from prior (image 9; series 5). Extensive centrilobular emphysematous change. No pleural effusion or pneumothorax.  CT ABDOMEN AND PELVIS FINDINGS  Liver is normal in size and contour without focal hepatic lesion identified. Gallbladder is unremarkable. Spleen, pancreas and bilateral adrenal glands are unremarkable.  Kidneys enhance symmetrically with contrast.  No hydronephrosis.  Normal caliber abdominal aorta with scattered calcified atherosclerotic plaque. No retroperitoneal lymphadenopathy.  Unremarkable urinary bladder. Uterus and adnexal structures are unremarkable.  No abnormal bowel wall thickening or evidence for bowel obstruction. No free fluid or free intraperitoneal air.  Unchanged kyphoplasty material within the T4 and T7 vertebral bodies. Persistent vertebral plana of the T3 vertebral body. Suspect AVN of the femoral heads bilaterally.  IMPRESSION: 1. No evidence for recurrent or metastatic disease in the chest, abdomen or pelvis. 2. Stable pleural-based right upper lobe opacity, suspected represent an area of scarring.   Electronically Signed   By: DLovey NewcomerM.D.   On: 08/20/2013 09:18   Ct Abdomen Pelvis W Contrast  08/20/2013   CLINICAL DATA:  Patient with history of non-small cell lung  cancer.  EXAM: CT CHEST, ABDOMEN, AND PELVIS WITH CONTRAST  TECHNIQUE: Multidetector CT imaging of the chest, abdomen and pelvis was performed following the standard protocol during bolus administration of intravenous contrast.  CONTRAST:  1097mOMNIPAQUE IOHEXOL 300 MG/ML  SOLN  COMPARISON:  CT 06/18/2013  FINDINGS: CT CHEST FINDINGS  Visualized neck base is unremarkable. No axillary, mediastinal or hilar lymphadenopathy. Normal heart size. No pericardial effusion. Coronary arterial calcifications. Right anterior chest wall Port-A-Cath is present with tip projecting at the right superior cavoatrial junction.  The central airways are patent. Dependent atelectasis within the right lower lobe. Stable 1.1 cm subpleural opacity within the anterior right upper lobe (image 17; series 5). Unchanged right-greater-than-left biapical pleural parenchymal scarring. Unchanged peribronchovascular soft tissue within medial right upper lobe (image 17; series 5). Posterior right apical pleural thickening unchanged from prior (image 9; series 5). Extensive centrilobular emphysematous change. No  pleural effusion or pneumothorax.  CT ABDOMEN AND PELVIS FINDINGS  Liver is normal in size and contour without focal hepatic lesion identified. Gallbladder is unremarkable. Spleen, pancreas and bilateral adrenal glands are unremarkable.  Kidneys enhance symmetrically with contrast.  No hydronephrosis.  Normal caliber abdominal aorta with scattered calcified atherosclerotic plaque. No retroperitoneal lymphadenopathy.  Unremarkable urinary bladder. Uterus and adnexal structures are unremarkable.  No abnormal bowel wall thickening or evidence for bowel obstruction. No free fluid or free intraperitoneal air.  Unchanged kyphoplasty material within the T4 and T7 vertebral bodies. Persistent vertebral plana of the T3 vertebral body. Suspect AVN of the femoral heads bilaterally.  IMPRESSION: 1. No evidence for recurrent or metastatic disease in the  chest, abdomen or pelvis. 2. Stable pleural-based right upper lobe opacity, suspected represent an area of scarring.   Electronically Signed   By: Lovey Newcomer M.D.   On: 08/20/2013 09:18   ASSESSMENT AND PLAN: This is a very pleasant 63 years old white female with metastatic non-small cell lung cancer currently undergoing maintenance chemotherapy with single agent Alimta status post 103 cycles. The patient is tolerating her treatment fairly well with no significant adverse effects. The patient was discussed with and also seen by Dr. Julien Nordmann. Her recent restaging CT scan of the chest, abdomen and pelvis with contrast revealed stable disease with no evidence for new or progressive disease within the chest, abdomen or pelvis. She will continue on maintenance treatment with single agent Alimta. She will proceed with cycle #104 today as scheduled. I recommended for her to continue her current treatment with maintenance Alimta as scheduled. She will proceed with cycle #103 today. A prescription for Diflucan was sent to her pharmacy of record to address the oral candidiasis. She'll followup in 3 weeks prior to the start of cycle #105. For pain management, she will continue on her current pain medication with MS Contin and MS IR. She will continue to take her Xanax as prescribed.  She was advised to call immediately if she has any concerning symptoms in the interval. The patient voices understanding of current disease status and treatment options and is in agreement with the current care plan.  All questions were answered. The patient knows to call the clinic with any problems, questions or concerns. We can certainly see the patient much sooner if necessary.  Carlton Adam PA-C  ADDENDUM: Hematology/Oncology Attending: I had a face to face encounter with the patient. I recommended her care plan. This is a very pleasant 63 years old white female with metastatic non-small cell lung cancer, adenocarcinoma  currently undergoing maintenance chemotherapy with single agent Alimta status post 103 cycles. She is tolerating her treatment fairly well with no significant adverse effects. Her recent CT scan of the chest, abdomen and pelvis showed no evidence for disease progression. I discussed the scan results with the patient and her sister. I recommended for her to continue on maintenance chemotherapy with single agent Alimta as scheduled. She will receive cycle #104 today. The patient would come back for followup visit in 3 weeks with the next cycle of her treatment. She was advised to call immediately if she has any concerning symptoms in the interval.  Disclaimer: This note was dictated with voice recognition software. Similar sounding words can inadvertently be transcribed and may not be corrected upon review. Eilleen Kempf., MD 08/25/2013

## 2013-08-24 NOTE — Patient Instructions (Signed)
Your CT scan revealed stable disease without any evidence for recurrent or metastatic disease Take the Diflucan as prescribed and remember not to take your cholesterol medication while you were taking the Diflucan Followup in 3 weeks prior to your next scheduled cycle of maintenance chemotherapy

## 2013-08-24 NOTE — Telephone Encounter (Signed)
gv and printed appt sched adn avs for pt for Sept....sed added tx.

## 2013-09-03 ENCOUNTER — Other Ambulatory Visit: Payer: Self-pay | Admitting: Family Medicine

## 2013-09-03 ENCOUNTER — Telehealth: Payer: Self-pay | Admitting: Family Medicine

## 2013-09-03 NOTE — Telephone Encounter (Signed)
Probably not needed with this pt. She has high clainms because she has met lung cancer and is on her 59 th chemo treatment!!! Otherwise she is stable and healthy.

## 2013-09-03 NOTE — Telephone Encounter (Signed)
The [facility] nurse manager came by with a list of patients who had high Medicare claim amounts in the past 12 months due to hospital admissions.  This patient was on the list.  Would you like for [facility] to reach out to the patient?  If so, I will place a referral form in your box and you can give it to your CMA to fax to [facility].

## 2013-09-08 ENCOUNTER — Other Ambulatory Visit: Payer: Self-pay | Admitting: *Deleted

## 2013-09-08 DIAGNOSIS — C7949 Secondary malignant neoplasm of other parts of nervous system: Secondary | ICD-10-CM | POA: Diagnosis not present

## 2013-09-08 DIAGNOSIS — C3410 Malignant neoplasm of upper lobe, unspecified bronchus or lung: Secondary | ICD-10-CM

## 2013-09-08 DIAGNOSIS — C3490 Malignant neoplasm of unspecified part of unspecified bronchus or lung: Secondary | ICD-10-CM | POA: Diagnosis not present

## 2013-09-08 DIAGNOSIS — C7931 Secondary malignant neoplasm of brain: Secondary | ICD-10-CM | POA: Diagnosis not present

## 2013-09-08 MED ORDER — MORPHINE SULFATE ER 60 MG PO TBCR: 60.0000 mg | EXTENDED_RELEASE_TABLET | Freq: Two times a day (BID) | ORAL | Status: DC

## 2013-09-08 MED ORDER — MORPHINE SULFATE 30 MG PO TABS: 30.0000 mg | ORAL_TABLET | ORAL | Status: DC | PRN

## 2013-09-08 MED ORDER — ALPRAZOLAM 0.25 MG PO TABS: 0.2500 mg | ORAL_TABLET | Freq: Every evening | ORAL | Status: DC | PRN

## 2013-09-14 ENCOUNTER — Ambulatory Visit: Payer: Medicare Other

## 2013-09-14 ENCOUNTER — Telehealth: Payer: Self-pay | Admitting: Internal Medicine

## 2013-09-14 ENCOUNTER — Ambulatory Visit (HOSPITAL_BASED_OUTPATIENT_CLINIC_OR_DEPARTMENT_OTHER): Payer: Medicare Other

## 2013-09-14 ENCOUNTER — Other Ambulatory Visit (HOSPITAL_BASED_OUTPATIENT_CLINIC_OR_DEPARTMENT_OTHER): Payer: Medicare Other

## 2013-09-14 ENCOUNTER — Other Ambulatory Visit: Payer: Medicare Other

## 2013-09-14 ENCOUNTER — Ambulatory Visit: Payer: Medicare Other | Admitting: Physician Assistant

## 2013-09-14 ENCOUNTER — Ambulatory Visit (HOSPITAL_BASED_OUTPATIENT_CLINIC_OR_DEPARTMENT_OTHER): Payer: Medicare Other | Admitting: Internal Medicine

## 2013-09-14 VITALS — BP 144/74 | HR 108 | Temp 98.5°F | Resp 18 | Ht 63.0 in | Wt 157.0 lb

## 2013-09-14 DIAGNOSIS — Z23 Encounter for immunization: Secondary | ICD-10-CM

## 2013-09-14 DIAGNOSIS — C7949 Secondary malignant neoplasm of other parts of nervous system: Secondary | ICD-10-CM

## 2013-09-14 DIAGNOSIS — C7931 Secondary malignant neoplasm of brain: Secondary | ICD-10-CM

## 2013-09-14 DIAGNOSIS — C7952 Secondary malignant neoplasm of bone marrow: Secondary | ICD-10-CM

## 2013-09-14 DIAGNOSIS — Z5111 Encounter for antineoplastic chemotherapy: Secondary | ICD-10-CM | POA: Diagnosis present

## 2013-09-14 DIAGNOSIS — I10 Essential (primary) hypertension: Secondary | ICD-10-CM

## 2013-09-14 DIAGNOSIS — C3410 Malignant neoplasm of upper lobe, unspecified bronchus or lung: Secondary | ICD-10-CM | POA: Diagnosis not present

## 2013-09-14 DIAGNOSIS — C7951 Secondary malignant neoplasm of bone: Secondary | ICD-10-CM

## 2013-09-14 DIAGNOSIS — C3491 Malignant neoplasm of unspecified part of right bronchus or lung: Secondary | ICD-10-CM

## 2013-09-14 LAB — CBC WITH DIFFERENTIAL/PLATELET
BASO%: 0.7 % (ref 0.0–2.0)
Basophils Absolute: 0.1 10*3/uL (ref 0.0–0.1)
EOS ABS: 0 10*3/uL (ref 0.0–0.5)
EOS%: 0.3 % (ref 0.0–7.0)
HEMATOCRIT: 38.6 % (ref 34.8–46.6)
HEMOGLOBIN: 12.7 g/dL (ref 11.6–15.9)
LYMPH#: 1.7 10*3/uL (ref 0.9–3.3)
LYMPH%: 16.1 % (ref 14.0–49.7)
MCH: 30.4 pg (ref 25.1–34.0)
MCHC: 33 g/dL (ref 31.5–36.0)
MCV: 92.1 fL (ref 79.5–101.0)
MONO#: 1 10*3/uL — AB (ref 0.1–0.9)
MONO%: 9.6 % (ref 0.0–14.0)
NEUT%: 73.3 % (ref 38.4–76.8)
NEUTROS ABS: 7.6 10*3/uL — AB (ref 1.5–6.5)
PLATELETS: 447 10*3/uL — AB (ref 145–400)
RBC: 4.18 10*6/uL (ref 3.70–5.45)
RDW: 14.5 % (ref 11.2–14.5)
WBC: 10.4 10*3/uL — AB (ref 3.9–10.3)

## 2013-09-14 LAB — COMPREHENSIVE METABOLIC PANEL (CC13)
ALT: 7 U/L (ref 0–55)
ANION GAP: 12 meq/L — AB (ref 3–11)
AST: 15 U/L (ref 5–34)
Albumin: 3.6 g/dL (ref 3.5–5.0)
Alkaline Phosphatase: 81 U/L (ref 40–150)
BUN: 10.3 mg/dL (ref 7.0–26.0)
CALCIUM: 10.3 mg/dL (ref 8.4–10.4)
CHLORIDE: 107 meq/L (ref 98–109)
CO2: 22 meq/L (ref 22–29)
Creatinine: 0.9 mg/dL (ref 0.6–1.1)
Glucose: 169 mg/dl — ABNORMAL HIGH (ref 70–140)
Potassium: 4.4 mEq/L (ref 3.5–5.1)
Sodium: 141 mEq/L (ref 136–145)
Total Bilirubin: 0.31 mg/dL (ref 0.20–1.20)
Total Protein: 7.5 g/dL (ref 6.4–8.3)

## 2013-09-14 MED ORDER — SODIUM CHLORIDE 0.9 % IJ SOLN
10.0000 mL | INTRAMUSCULAR | Status: DC | PRN
  Administered 2013-09-14: 10 mL
  Filled 2013-09-14: qty 10

## 2013-09-14 MED ORDER — CYANOCOBALAMIN 1000 MCG/ML IJ SOLN
1000.0000 ug | Freq: Once | INTRAMUSCULAR | Status: AC
  Administered 2013-09-14: 1000 ug via INTRAMUSCULAR

## 2013-09-14 MED ORDER — DEXAMETHASONE SODIUM PHOSPHATE 10 MG/ML IJ SOLN
10.0000 mg | Freq: Once | INTRAMUSCULAR | Status: AC
  Administered 2013-09-14: 10 mg via INTRAVENOUS

## 2013-09-14 MED ORDER — SODIUM CHLORIDE 0.9 % IV SOLN
Freq: Once | INTRAVENOUS | Status: AC
  Administered 2013-09-14: 11:00:00 via INTRAVENOUS

## 2013-09-14 MED ORDER — DEXAMETHASONE SODIUM PHOSPHATE 10 MG/ML IJ SOLN
INTRAMUSCULAR | Status: AC
  Filled 2013-09-14: qty 1

## 2013-09-14 MED ORDER — ONDANSETRON 8 MG/50ML IVPB (CHCC)
8.0000 mg | Freq: Once | INTRAVENOUS | Status: AC
  Administered 2013-09-14: 8 mg via INTRAVENOUS

## 2013-09-14 MED ORDER — ONDANSETRON 8 MG/NS 50 ML IVPB
INTRAVENOUS | Status: AC
  Filled 2013-09-14: qty 8

## 2013-09-14 MED ORDER — CYANOCOBALAMIN 1000 MCG/ML IJ SOLN
INTRAMUSCULAR | Status: AC
  Filled 2013-09-14: qty 1

## 2013-09-14 MED ORDER — INFLUENZA VAC SPLIT QUAD 0.5 ML IM SUSY
0.5000 mL | PREFILLED_SYRINGE | Freq: Once | INTRAMUSCULAR | Status: AC
  Administered 2013-09-14: 0.5 mL via INTRAMUSCULAR
  Filled 2013-09-14: qty 0.5

## 2013-09-14 MED ORDER — SODIUM CHLORIDE 0.9 % IV SOLN
500.0000 mg/m2 | Freq: Once | INTRAVENOUS | Status: AC
  Administered 2013-09-14: 900 mg via INTRAVENOUS
  Filled 2013-09-14: qty 36

## 2013-09-14 MED ORDER — HEPARIN SOD (PORK) LOCK FLUSH 100 UNIT/ML IV SOLN
500.0000 [IU] | Freq: Once | INTRAVENOUS | Status: AC | PRN
  Administered 2013-09-14: 500 [IU]
  Filled 2013-09-14: qty 5

## 2013-09-14 NOTE — Patient Instructions (Signed)
[place] [place] Discharge Instructions for Patients Receiving Chemotherapy  Today you received the following chemotherapy agents: Alimta.  You received your flu vaccination and B12 injection today as well.  To help prevent nausea and vomiting after your treatment, we encourage you to take your nausea medication as prescribed by your physician.   If you develop nausea and vomiting that is not controlled by your nausea medication, call the clinic.   BELOW ARE SYMPTOMS THAT SHOULD BE REPORTED IMMEDIATELY:  *FEVER GREATER THAN 100.5 F  *CHILLS WITH OR WITHOUT FEVER  NAUSEA AND VOMITING THAT IS NOT CONTROLLED WITH YOUR NAUSEA MEDICATION  *UNUSUAL SHORTNESS OF BREATH  *UNUSUAL BRUISING OR BLEEDING  TENDERNESS IN MOUTH AND THROAT WITH OR WITHOUT PRESENCE OF ULCERS  *URINARY PROBLEMS  *BOWEL PROBLEMS  UNUSUAL RASH Items with * indicate a potential emergency and should be followed up as soon as possible.  Feel free to call the clinic you have any questions or concerns. The clinic phone number is [phone].

## 2013-09-14 NOTE — Telephone Encounter (Signed)
gv pt relative appt schedule for sep/oct

## 2013-09-14 NOTE — Progress Notes (Signed)
Danielle [name]:([phone]   Fax:(336) [phone]  OFFICE PROGRESS NOTE  [name], MD [address] [place]. [name] [address]  Diagnosis:  Metastatic non-small cell lung cancer initially diagnosed with locally advanced stage IIIB with right Pancoast tumor involving the vertebral body as well as the foraminal canal invasion with spinal cord compression in October of 2007.   Prior Therapy:  1. Status post concurrent chemoradiation with weekly carboplatin and paclitaxel, last dose was given November 18, 2005. 2. 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 3. Status post gamma knife radiotherapy to a solitary brain lesion located in the superior frontal area of the brain at [facility] in April of 2008. 4. Status post palliative radiotherapy to the lateral abdominal wall metastatic lesion under the care of Dr. Lisbeth Renshaw, completed March of 2009. 5. Status post 6 cycles of systemic chemotherapy with carboplatin and Alimta. Last dose was given July 26, 2007 with disease stabilization. 6. Gamma knife stereotactic radiotherapy to 2 brain lesions one involving the right frontal dural based as well as right parietal lesion performed on 05/07/2012 under the care of Dr. Lilyan Punt at [facility].  Current Therapy:  Maintenance chemotherapy with Alimta 500 mg per meter squared given every 3 weeks. The patient is status post 104 cycles.   CHEMOTHERAPY INTENT: Palliative/maintenance  CURRENT # OF CHEMOTHERAPY CYCLES: 105 CURRENT ANTIEMETICS: Zofran, dexamethasone and Compazine  CURRENT SMOKING STATUS: Quit smoking 10/07/2005  ORAL CHEMOTHERAPY AND CONSENT: None  CURRENT BISPHOSPHONATES USE: None  PAIN MANAGEMENT: 0/10 currently on morphine  NARCOTICS INDUCED CONSTIPATION: No constipation  LIVING WILL AND CODE STATUS: No CODE BLUE   INTERVAL  HISTORY: Danielle [name] 63 y.o. female returns to the clinic today for followup visit accompanied by her sister. The patient is feeling fine today with no specific complaints. She is tolerating her maintenance treatment with single agent Alimta fairly well with no significant adverse effects. The patient denied having any chest pain, shortness of breath, cough or hemoptysis. She denied having any fever or chills, no nausea or vomiting. The patient denied having any weight loss or night sweats. She is here today for cycle #105.  MEDICAL HISTORY: Past Medical History  Diagnosis Date  . Hypertension   . Anemia   . H/O: pneumonia   . History of tobacco abuse quit 10/08    on nicotine patch  . Fibromyalgia   . Hypokalemia   . DJD (degenerative joint disease), cervical   . Thrush 12/11/2010  . 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 had chemo and radiation for lung ca    ALLERGIES:  is allergic to contrast media; co-trimoxazole injection; iohexol; and sulfa drugs cross reactors.  MEDICATIONS:  Current Outpatient Prescriptions  Medication Sig Dispense Refill  . ALPRAZolam (XANAX) 0.25 MG tablet Take 1 tablet (0.25 mg total) by mouth at bedtime as needed for sleep.  30 tablet  0  . amLODipine (NORVASC) 10 MG tablet Take 1 tablet (10 mg total) by mouth daily.  90 tablet  3  . antipyrine-benzocaine (AURALGAN) otic solution Place 1 drop into both ears daily.  10 mL  1  . fluconazole (DIFLUCAN) 100 MG tablet Take 2 tablets by mouth on day one, then take 1 tablet  daily until completed. Hold your cholesterol medicine while taking the fluconazole  16 tablet  0  .  fluticasone (FLONASE) 50 MCG/ACT nasal spray USE 1-2 SPRAYS IN EACH NOSTRIL DAILY  16 g  11  . folic acid (FOLVITE) 1 MG tablet TAKE 1 TABLET BY MOUTH DAILY.  90 tablet  1  . lidocaine-prilocaine (EMLA) cream Apply topically as needed.  30 g  1  . lisinopril (PRINIVIL,ZESTRIL) 20 MG tablet TAKE 2 TABLETS (40  MG TOTAL) BY MOUTH DAILY.  60 tablet  5  . morphine (MS CONTIN) 60 MG 12 hr tablet Take 1 tablet (60 mg total) by mouth 2 (two) times daily.  60 tablet  0  . morphine (MSIR) 30 MG tablet Take 1 tablet (30 mg total) by mouth every 4 (four) hours as needed.  60 tablet  0  . olopatadine (PATANOL) 0.1 % ophthalmic solution Place 1 drop into both eyes 2 (two) times daily.  5 mL  11  . ondansetron (ZOFRAN) 8 MG tablet TAKE 1 TABLET (8 MG TOTAL) BY MOUTH EVERY 8 (EIGHT) HOURS AS NEEDED FOR NAUSEA.  30 tablet  2  . predniSONE (DELTASONE) 50 MG tablet 13 hrs, 7 hrs, and 1 hr before scheduled CT scans      . simvastatin (ZOCOR) 20 MG tablet Take 1 tablet (20 mg total) by mouth daily.  90 tablet  3   Current Facility-Administered Medications  Medication Dose Route Frequency Provider Last Rate Last Dose  . Influenza vac split quadrivalent PF (FLUARIX) injection 0.5 mL  0.5 mL Intramuscular Once Curt Bears, MD        SURGICAL HISTORY:  Past Surgical History  Procedure Laterality Date  . Cervical laminectomy  1995  . Other surgical history  2008    Gamma knife surgery to remove brain met   . 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 Knee surgery  1990    Left x 2  . Portacath placement  -[name]    TIP IN CAVOATRIAL JUNCTION    REVIEW OF SYSTEMS:  Constitutional: negative Eyes: negative Ears, nose, mouth, throat, and face: negative Respiratory: negative Cardiovascular: negative Gastrointestinal: negative Genitourinary:negative Integument/breast: negative Hematologic/lymphatic: negative Musculoskeletal:negative Neurological: negative Behavioral/Psych: negative Endocrine: negative Allergic/Immunologic: negative   PHYSICAL EXAMINATION: General appearance: alert, cooperative and no distress Head: Normocephalic, without obvious abnormality, atraumatic Neck: no adenopathy, no JVD, supple, symmetrical, trachea midline and thyroid not enlarged, symmetric, no  tenderness/mass/nodules Lymph nodes: Cervical, supraclavicular, and axillary nodes normal. Resp: clear to auscultation bilaterally Back: symmetric, no curvature. ROM normal. No CVA tenderness. Cardio: regular rate and rhythm, S1, S2 normal, no murmur, click, rub or gallop GI: soft, non-tender; bowel sounds normal; no masses,  no organomegaly Extremities: extremities normal, atraumatic, no cyanosis or edema Neurologic: Alert and oriented X 3, normal strength and tone. Normal symmetric reflexes. Normal coordination and gait  ECOG PERFORMANCE STATUS: 0 - Asymptomatic  Blood pressure 144/74, pulse 108, temperature 98.5 F (36.9 C), temperature source Oral, resp. rate 18, height '5\' 3"'  (1.6 m), weight 157 lb (71.215 kg), SpO2 99.00%.  LABORATORY DATA: Lab Results  Component Value Date   WBC 10.4* 09/14/2013   HGB 12.7 09/14/2013   HCT 38.6 09/14/2013   MCV 92.1 09/14/2013   PLT 447* 09/14/2013      Chemistry      Component Value Date/Time   NA 141 09/14/2013 0906   NA 138 02/05/2013 1026   K 4.4 09/14/2013 0906   K 4.3 02/05/2013 1026   CL 105 02/05/2013 1026   CL 106 06/30/2012  1004   CO2 22 09/14/2013 0906   CO2 27 02/05/2013 1026   BUN 10.3 09/14/2013 0906   BUN 9 02/05/2013 1026   CREATININE 0.9 09/14/2013 0906   CREATININE 0.6 02/05/2013 1026      Component Value Date/Time   CALCIUM 10.3 09/14/2013 0906   CALCIUM 9.2 02/05/2013 1026   ALKPHOS 81 09/14/2013 0906   ALKPHOS 75 02/05/2013 1026   AST 15 09/14/2013 0906   AST 17 02/05/2013 1026   ALT 7 09/14/2013 0906   ALT 9 02/05/2013 1026   BILITOT 0.31 09/14/2013 0906   BILITOT 0.4 02/05/2013 1026       RADIOGRAPHIC STUDIES:  ASSESSMENT AND PLAN: This is a very pleasant 63 years old white female with metastatic non-small cell lung cancer currently undergoing maintenance chemotherapy with single agent Alimta status post 104 cycles. The patient is tolerating her treatment fairly well with no significant adverse effects.  I recommended for her to continue  her current treatment with maintenance Alimta as scheduled. She will proceed with cycle #105 today. For pain management, she will continue on her current pain medication with MS Contin and MS IR  The patient would come back for followup visit in 3 weeks for evaluation before starting cycle #106. She was advised to call immediately if she has any concerning symptoms in the interval. The patient voices understanding of current disease status and treatment options and is in agreement with the current care plan.  All questions were answered. The patient knows to call the clinic with any problems, questions or concerns. We can certainly see the patient much sooner if necessary.  Disclaimer: This note was dictated with voice recognition software. Similar sounding words can inadvertently be transcribed and may not be corrected upon review.

## 2013-10-05 ENCOUNTER — Ambulatory Visit (HOSPITAL_BASED_OUTPATIENT_CLINIC_OR_DEPARTMENT_OTHER): Payer: Medicare Other | Admitting: Internal Medicine

## 2013-10-05 ENCOUNTER — Telehealth: Payer: Self-pay | Admitting: *Deleted

## 2013-10-05 ENCOUNTER — Ambulatory Visit (HOSPITAL_BASED_OUTPATIENT_CLINIC_OR_DEPARTMENT_OTHER): Payer: Medicare Other

## 2013-10-05 ENCOUNTER — Ambulatory Visit: Payer: Medicare Other

## 2013-10-05 ENCOUNTER — Other Ambulatory Visit: Payer: Medicare Other

## 2013-10-05 ENCOUNTER — Other Ambulatory Visit: Payer: Self-pay | Admitting: *Deleted

## 2013-10-05 ENCOUNTER — Other Ambulatory Visit (HOSPITAL_BASED_OUTPATIENT_CLINIC_OR_DEPARTMENT_OTHER): Payer: Medicare Other

## 2013-10-05 ENCOUNTER — Telehealth: Payer: Self-pay | Admitting: Internal Medicine

## 2013-10-05 VITALS — BP 129/60 | HR 91 | Temp 98.3°F | Resp 18 | Ht 63.0 in | Wt 160.5 lb

## 2013-10-05 DIAGNOSIS — C7951 Secondary malignant neoplasm of bone: Secondary | ICD-10-CM

## 2013-10-05 DIAGNOSIS — C7952 Secondary malignant neoplasm of bone marrow: Secondary | ICD-10-CM

## 2013-10-05 DIAGNOSIS — C7949 Secondary malignant neoplasm of other parts of nervous system: Secondary | ICD-10-CM

## 2013-10-05 DIAGNOSIS — C3410 Malignant neoplasm of upper lobe, unspecified bronchus or lung: Secondary | ICD-10-CM

## 2013-10-05 DIAGNOSIS — C3491 Malignant neoplasm of unspecified part of right bronchus or lung: Secondary | ICD-10-CM

## 2013-10-05 DIAGNOSIS — C7931 Secondary malignant neoplasm of brain: Secondary | ICD-10-CM

## 2013-10-05 DIAGNOSIS — Z5111 Encounter for antineoplastic chemotherapy: Secondary | ICD-10-CM

## 2013-10-05 LAB — CBC WITH DIFFERENTIAL/PLATELET
BASO%: 0.3 % (ref 0.0–2.0)
BASOS ABS: 0 10*3/uL (ref 0.0–0.1)
EOS%: 0.4 % (ref 0.0–7.0)
Eosinophils Absolute: 0 10*3/uL (ref 0.0–0.5)
HCT: 37 % (ref 34.8–46.6)
HGB: 12.1 g/dL (ref 11.6–15.9)
LYMPH#: 2.1 10*3/uL (ref 0.9–3.3)
LYMPH%: 30.1 % (ref 14.0–49.7)
MCH: 30.3 pg (ref 25.1–34.0)
MCHC: 32.7 g/dL (ref 31.5–36.0)
MCV: 92.5 fL (ref 79.5–101.0)
MONO#: 0.9 10*3/uL (ref 0.1–0.9)
MONO%: 11.9 % (ref 0.0–14.0)
NEUT#: 4.1 10*3/uL (ref 1.5–6.5)
NEUT%: 57.3 % (ref 38.4–76.8)
Platelets: 385 10*3/uL (ref 145–400)
RBC: 4 10*6/uL (ref 3.70–5.45)
RDW: 14.3 % (ref 11.2–14.5)
WBC: 7.1 10*3/uL (ref 3.9–10.3)

## 2013-10-05 LAB — COMPREHENSIVE METABOLIC PANEL (CC13)
ALT: 7 U/L (ref 0–55)
ANION GAP: 7 meq/L (ref 3–11)
AST: 17 U/L (ref 5–34)
Albumin: 3.4 g/dL — ABNORMAL LOW (ref 3.5–5.0)
Alkaline Phosphatase: 83 U/L (ref 40–150)
BUN: 8.3 mg/dL (ref 7.0–26.0)
CALCIUM: 10.3 mg/dL (ref 8.4–10.4)
CO2: 27 meq/L (ref 22–29)
CREATININE: 0.8 mg/dL (ref 0.6–1.1)
Chloride: 109 mEq/L (ref 98–109)
Glucose: 109 mg/dl (ref 70–140)
POTASSIUM: 4.1 meq/L (ref 3.5–5.1)
Sodium: 143 mEq/L (ref 136–145)
Total Bilirubin: 0.37 mg/dL (ref 0.20–1.20)
Total Protein: 7.3 g/dL (ref 6.4–8.3)

## 2013-10-05 MED ORDER — ONDANSETRON 8 MG/50ML IVPB (CHCC)
8.0000 mg | Freq: Once | INTRAVENOUS | Status: AC
  Administered 2013-10-05: 8 mg via INTRAVENOUS

## 2013-10-05 MED ORDER — SODIUM CHLORIDE 0.9 % IV SOLN
Freq: Once | INTRAVENOUS | Status: AC
  Administered 2013-10-05: 13:00:00 via INTRAVENOUS

## 2013-10-05 MED ORDER — MORPHINE SULFATE ER 60 MG PO TBCR: 60.0000 mg | EXTENDED_RELEASE_TABLET | Freq: Two times a day (BID) | ORAL | Status: DC

## 2013-10-05 MED ORDER — PREDNISONE 50 MG PO TABS
ORAL_TABLET | ORAL | Status: DC
Start: 2013-10-05 — End: 2014-04-12

## 2013-10-05 MED ORDER — ONDANSETRON 8 MG/NS 50 ML IVPB
INTRAVENOUS | Status: AC
  Filled 2013-10-05: qty 8

## 2013-10-05 MED ORDER — ALPRAZOLAM 0.25 MG PO TABS: 0.2500 mg | ORAL_TABLET | Freq: Every evening | ORAL | Status: DC | PRN

## 2013-10-05 MED ORDER — SODIUM CHLORIDE 0.9 % IJ SOLN
10.0000 mL | INTRAMUSCULAR | Status: DC | PRN
  Administered 2013-10-05: 10 mL
  Filled 2013-10-05: qty 10

## 2013-10-05 MED ORDER — DEXAMETHASONE SODIUM PHOSPHATE 10 MG/ML IJ SOLN
10.0000 mg | Freq: Once | INTRAMUSCULAR | Status: AC
  Administered 2013-10-05: 10 mg via INTRAVENOUS

## 2013-10-05 MED ORDER — PREDNISONE 50 MG PO TABS: ORAL_TABLET | ORAL | Status: DC

## 2013-10-05 MED ORDER — SODIUM CHLORIDE 0.9 % IV SOLN
500.0000 mg/m2 | Freq: Once | INTRAVENOUS | Status: AC
  Administered 2013-10-05: 900 mg via INTRAVENOUS
  Filled 2013-10-05: qty 36

## 2013-10-05 MED ORDER — DEXAMETHASONE SODIUM PHOSPHATE 10 MG/ML IJ SOLN
INTRAMUSCULAR | Status: AC
  Filled 2013-10-05: qty 1

## 2013-10-05 MED ORDER — MORPHINE SULFATE 30 MG PO TABS: 30.0000 mg | ORAL_TABLET | ORAL | Status: DC | PRN

## 2013-10-05 MED ORDER — HEPARIN SOD (PORK) LOCK FLUSH 100 UNIT/ML IV SOLN
500.0000 [IU] | Freq: Once | INTRAVENOUS | Status: AC | PRN
  Administered 2013-10-05: 500 [IU]
  Filled 2013-10-05: qty 5

## 2013-10-05 NOTE — Telephone Encounter (Signed)
Per staff message and POF I have scheduled appts. Advised scheduler of appts. JMW  

## 2013-10-05 NOTE — Progress Notes (Signed)
[place] Telephone:(336) [phone]   Fax:(336) [phone]  OFFICE PROGRESS NOTE  [name], MD [name]. [name] [address]  Diagnosis:  Metastatic non-small cell lung cancer initially diagnosed with locally advanced stage IIIB with right Pancoast tumor involving the vertebral body as well as the foraminal canal invasion with spinal cord compression in October of 2007.   Prior Therapy:  1. Status post concurrent chemoradiation with weekly carboplatin and paclitaxel, last dose was given November 18, 2005. 2. 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 3. Status post gamma knife radiotherapy to a solitary brain lesion located in the superior frontal area of the brain at [facility] in April of 2008. 4. Status post palliative radiotherapy to the lateral abdominal wall metastatic lesion under the care of Dr. Lisbeth Renshaw, completed March of 2009. 5. Status post 6 cycles of systemic chemotherapy with carboplatin and Alimta. Last dose was given July 26, 2007 with disease stabilization. 6. Gamma knife stereotactic radiotherapy to 2 brain lesions one involving the right frontal dural based as well as right parietal lesion performed on 05/07/2012 under the care of Dr. Lilyan Punt at [facility].  Current Therapy:  Maintenance chemotherapy with Alimta 500 mg per meter squared given every 3 weeks. The patient is status post 105 cycles.   CHEMOTHERAPY INTENT: Palliative/maintenance  CURRENT # OF CHEMOTHERAPY CYCLES: 106 CURRENT ANTIEMETICS: Zofran, dexamethasone and Compazine  CURRENT SMOKING STATUS: Quit smoking 10/07/2005  ORAL CHEMOTHERAPY AND CONSENT: None  CURRENT BISPHOSPHONATES USE: None  PAIN MANAGEMENT: 0/10 currently on morphine  NARCOTICS INDUCED CONSTIPATION: No constipation  LIVING WILL AND CODE STATUS: No CODE BLUE   INTERVAL  HISTORY: Danielle [name] 63 y.o. female returns to the clinic today for followup visit accompanied by her sister. The patient is feeling fine today with no specific complaints. She is tolerating her maintenance treatment with single agent Alimta fairly well with no significant adverse effects. The patient denied having any chest pain, shortness of breath, cough or hemoptysis. She denied having any fever or chills, no nausea or vomiting. The patient denied having any weight loss or night sweats. She is here today for cycle #106.  MEDICAL HISTORY: Past Medical History  Diagnosis Date  . Hypertension   . Anemia   . H/O: pneumonia   . History of tobacco abuse quit 10/08    on nicotine patch  . Fibromyalgia   . Hypokalemia   . DJD (degenerative joint disease), cervical   . Thrush 12/11/2010  . 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 had chemo and radiation for lung ca    ALLERGIES:  is allergic to contrast media; co-trimoxazole injection; iohexol; and sulfa drugs cross reactors.  MEDICATIONS:  Current Outpatient Prescriptions  Medication Sig Dispense Refill  . ALPRAZolam (XANAX) 0.25 MG tablet Take 1 tablet (0.25 mg total) by mouth at bedtime as needed for sleep.  30 tablet  0  . amLODipine (NORVASC) 10 MG tablet Take 1 tablet (10 mg total) by mouth daily.  90 tablet  3  . antipyrine-benzocaine (AURALGAN) otic solution Place 1 drop into both ears daily.  10 mL  1  . fluconazole (DIFLUCAN) 100 MG tablet Take 2 tablets by mouth on day one, then take 1 tablet  daily until completed. Hold your cholesterol medicine while taking the fluconazole  16 tablet  0  .  fluticasone (FLONASE) 50 MCG/ACT nasal spray USE 1-2 SPRAYS IN EACH NOSTRIL DAILY  16 g  11  . folic acid (FOLVITE) 1 MG tablet TAKE 1 TABLET BY MOUTH DAILY.  90 tablet  1  . lidocaine-prilocaine (EMLA) cream Apply topically as needed.  30 g  1  . lisinopril (PRINIVIL,ZESTRIL) 20 MG tablet TAKE 2 TABLETS (40  MG TOTAL) BY MOUTH DAILY.  60 tablet  5  . morphine (MS CONTIN) 60 MG 12 hr tablet Take 1 tablet (60 mg total) by mouth 2 (two) times daily.  60 tablet  0  . morphine (MSIR) 30 MG tablet Take 1 tablet (30 mg total) by mouth every 4 (four) hours as needed.  60 tablet  0  . olopatadine (PATANOL) 0.1 % ophthalmic solution Place 1 drop into both eyes 2 (two) times daily.  5 mL  11  . ondansetron (ZOFRAN) 8 MG tablet TAKE 1 TABLET (8 MG TOTAL) BY MOUTH EVERY 8 (EIGHT) HOURS AS NEEDED FOR NAUSEA.  30 tablet  2  . predniSONE (DELTASONE) 50 MG tablet 13 hrs, 7 hrs, and 1 hr before scheduled CT scans  9 tablet  0  . simvastatin (ZOCOR) 20 MG tablet Take 1 tablet (20 mg total) by mouth daily.  90 tablet  3   No current facility-administered medications for this visit.    SURGICAL HISTORY:  Past Surgical History  Procedure Laterality Date  . Cervical laminectomy  1995  . Other surgical history  2008    Gamma knife surgery to remove brain met   . 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 Knee surgery  1990    Left x 2  . Portacath placement  -[name]    TIP IN CAVOATRIAL JUNCTION    REVIEW OF SYSTEMS:  Constitutional: negative Eyes: negative Ears, nose, mouth, throat, and face: negative Respiratory: negative Cardiovascular: negative Gastrointestinal: negative Genitourinary:negative Integument/breast: negative Hematologic/lymphatic: negative Musculoskeletal:negative Neurological: negative Behavioral/Psych: negative Endocrine: negative Allergic/Immunologic: negative   PHYSICAL EXAMINATION: General appearance: alert, cooperative and no distress Head: Normocephalic, without obvious abnormality, atraumatic Neck: no adenopathy, no JVD, supple, symmetrical, trachea midline and thyroid not enlarged, symmetric, no tenderness/mass/nodules Lymph nodes: Cervical, supraclavicular, and axillary nodes normal. Resp: clear to auscultation bilaterally Back: symmetric, no  curvature. ROM normal. No CVA tenderness. Cardio: regular rate and rhythm, S1, S2 normal, no murmur, click, rub or gallop GI: soft, non-tender; bowel sounds normal; no masses,  no organomegaly Extremities: extremities normal, atraumatic, no cyanosis or edema Neurologic: Alert and oriented X 3, normal strength and tone. Normal symmetric reflexes. Normal coordination and gait  ECOG PERFORMANCE STATUS: 0 - Asymptomatic  Blood pressure 129/60, pulse 91, temperature 98.3 F (36.8 C), temperature source Oral, resp. rate 18, height '5\' 3"'  (1.6 m), weight 160 lb 8 oz (72.802 kg).  LABORATORY DATA: Lab Results  Component Value Date   WBC 7.1 10/05/2013   HGB 12.1 10/05/2013   HCT 37.0 10/05/2013   MCV 92.5 10/05/2013   PLT 385 10/05/2013      Chemistry      Component Value Date/Time   NA 143 10/05/2013 1059   NA 138 02/05/2013 1026   K 4.1 10/05/2013 1059   K 4.3 02/05/2013 1026   CL 105 02/05/2013 1026   CL 106 06/30/2012 1004   CO2 27 10/05/2013 1059   CO2 27 02/05/2013 1026   BUN 8.3 10/05/2013 1059   BUN 9 02/05/2013 1026   CREATININE  0.8 10/05/2013 1059   CREATININE 0.6 02/05/2013 1026      Component Value Date/Time   CALCIUM 10.3 10/05/2013 1059   CALCIUM 9.2 02/05/2013 1026   ALKPHOS 83 10/05/2013 1059   ALKPHOS 75 02/05/2013 1026   AST 17 10/05/2013 1059   AST 17 02/05/2013 1026   ALT 7 10/05/2013 1059   ALT 9 02/05/2013 1026   BILITOT 0.37 10/05/2013 1059   BILITOT 0.4 02/05/2013 1026       RADIOGRAPHIC STUDIES: No results found.  ASSESSMENT AND PLAN: This is a very pleasant 63 years old white female with metastatic non-small cell lung cancer currently undergoing maintenance chemotherapy with single agent Alimta status post 105 cycles. The patient is tolerating her treatment fairly well with no significant adverse effects.  I recommended for her to continue her current treatment with maintenance Alimta as scheduled. She will proceed with cycle #106 today. For pain management, she will  continue on her current pain medication with MS Contin and MS IR. She was given a refill for her pain medications. The patient would come back for followup visit in 3 weeks for evaluation after repeating CT scan of the chest, abdomen and pelvis for restaging of her disease before starting cycle #106. She was advised to call immediately if she has any concerning symptoms in the interval. The patient voices understanding of current disease status and treatment options and is in agreement with the current care plan.  All questions were answered. The patient knows to call the clinic with any problems, questions or concerns. We can certainly see the patient much sooner if necessary.  Disclaimer: This note was dictated with voice recognition software. Similar sounding words can inadvertently be transcribed and may not be corrected upon review.

## 2013-10-05 NOTE — Telephone Encounter (Signed)
Pt confirmed labs/ov per 09/29 POF, sent msg to move chemo down due to MD add on, gave pt AVS.... KJ

## 2013-10-05 NOTE — Patient Instructions (Signed)
[facility] Discharge Instructions for Patients Receiving Chemotherapy  Today you received the following chemotherapy agents: Alimta  To help prevent nausea and vomiting after your treatment, we encourage you to take your nausea medication as prescribed.   If you develop nausea and vomiting that is not controlled by your nausea medication, call the clinic.   BELOW ARE SYMPTOMS THAT SHOULD BE REPORTED IMMEDIATELY:  *FEVER GREATER THAN 100.5 F  *CHILLS WITH OR WITHOUT FEVER  NAUSEA AND VOMITING THAT IS NOT CONTROLLED WITH YOUR NAUSEA MEDICATION  *UNUSUAL SHORTNESS OF BREATH  *UNUSUAL BRUISING OR BLEEDING  TENDERNESS IN MOUTH AND THROAT WITH OR WITHOUT PRESENCE OF ULCERS  *URINARY PROBLEMS  *BOWEL PROBLEMS  UNUSUAL RASH Items with * indicate a potential emergency and should be followed up as soon as possible.  Feel free to call the clinic you have any questions or concerns. The clinic phone number is [phone].

## 2013-10-06 ENCOUNTER — Encounter: Payer: Self-pay | Admitting: *Deleted

## 2013-10-06 NOTE — Progress Notes (Signed)
10/05/13 @ 11:30 am, BMS [id number]:  Danielle [name] into the [facility] treatment with maintenance pemetrexed.  She had research labs drawn via venipuncture with [facility] labs and completed the LCSS and EQ- 5D-3L PROs prior to treatment.  Will review her medical record after the visit for data entry purpose on study.

## 2013-10-22 ENCOUNTER — Encounter (HOSPITAL_COMMUNITY): Payer: Self-pay

## 2013-10-22 ENCOUNTER — Ambulatory Visit (HOSPITAL_COMMUNITY)
Admission: RE | Admit: 2013-10-22 | Discharge: 2013-10-22 | Disposition: A | Discharge status: Home / Self Care | Payer: Medicare Other | Source: Ambulatory Visit | Attending: Internal Medicine | Admitting: Internal Medicine

## 2013-10-22 DIAGNOSIS — C7951 Secondary malignant neoplasm of bone: Secondary | ICD-10-CM | POA: Insufficient documentation

## 2013-10-22 DIAGNOSIS — C3410 Malignant neoplasm of upper lobe, unspecified bronchus or lung: Secondary | ICD-10-CM | POA: Diagnosis not present

## 2013-10-22 DIAGNOSIS — R0602 Shortness of breath: Secondary | ICD-10-CM | POA: Insufficient documentation

## 2013-10-22 DIAGNOSIS — R112 Nausea with vomiting, unspecified: Secondary | ICD-10-CM | POA: Diagnosis not present

## 2013-10-22 DIAGNOSIS — C786 Secondary malignant neoplasm of retroperitoneum and peritoneum: Secondary | ICD-10-CM | POA: Insufficient documentation

## 2013-10-22 DIAGNOSIS — C7931 Secondary malignant neoplasm of brain: Secondary | ICD-10-CM | POA: Diagnosis not present

## 2013-10-22 MED ORDER — IOHEXOL 300 MG/ML  SOLN
100.0000 mL | Freq: Once | INTRAMUSCULAR | Status: AC | PRN
  Administered 2013-10-22: 100 mL via INTRAVENOUS

## 2013-10-26 ENCOUNTER — Ambulatory Visit (HOSPITAL_BASED_OUTPATIENT_CLINIC_OR_DEPARTMENT_OTHER): Payer: Medicare Other | Admitting: Internal Medicine

## 2013-10-26 ENCOUNTER — Other Ambulatory Visit (HOSPITAL_BASED_OUTPATIENT_CLINIC_OR_DEPARTMENT_OTHER): Payer: Medicare Other

## 2013-10-26 ENCOUNTER — Other Ambulatory Visit: Payer: Medicare Other

## 2013-10-26 ENCOUNTER — Ambulatory Visit (HOSPITAL_BASED_OUTPATIENT_CLINIC_OR_DEPARTMENT_OTHER): Payer: Medicare Other

## 2013-10-26 ENCOUNTER — Telehealth: Payer: Self-pay | Admitting: Internal Medicine

## 2013-10-26 VITALS — BP 137/68 | HR 94 | Temp 97.7°F | Resp 18 | Ht 63.0 in | Wt 158.3 lb

## 2013-10-26 DIAGNOSIS — C3411 Malignant neoplasm of upper lobe, right bronchus or lung: Secondary | ICD-10-CM | POA: Diagnosis not present

## 2013-10-26 DIAGNOSIS — Z5111 Encounter for antineoplastic chemotherapy: Secondary | ICD-10-CM

## 2013-10-26 DIAGNOSIS — C3410 Malignant neoplasm of upper lobe, unspecified bronchus or lung: Secondary | ICD-10-CM

## 2013-10-26 DIAGNOSIS — I10 Essential (primary) hypertension: Secondary | ICD-10-CM

## 2013-10-26 LAB — CBC WITH DIFFERENTIAL/PLATELET
BASO%: 0.2 % (ref 0.0–2.0)
BASOS ABS: 0 10*3/uL (ref 0.0–0.1)
EOS ABS: 0 10*3/uL (ref 0.0–0.5)
EOS%: 0.1 % (ref 0.0–7.0)
HEMATOCRIT: 39 % (ref 34.8–46.6)
HEMOGLOBIN: 12.9 g/dL (ref 11.6–15.9)
LYMPH%: 16.3 % (ref 14.0–49.7)
MCH: 30.1 pg (ref 25.1–34.0)
MCHC: 33.1 g/dL (ref 31.5–36.0)
MCV: 91.1 fL (ref 79.5–101.0)
MONO#: 0.8 10*3/uL (ref 0.1–0.9)
MONO%: 8.2 % (ref 0.0–14.0)
NEUT#: 7 10*3/uL — ABNORMAL HIGH (ref 1.5–6.5)
NEUT%: 75.2 % (ref 38.4–76.8)
PLATELETS: 361 10*3/uL (ref 145–400)
RBC: 4.28 10*6/uL (ref 3.70–5.45)
RDW: 14.3 % (ref 11.2–14.5)
WBC: 9.3 10*3/uL (ref 3.9–10.3)
lymph#: 1.5 10*3/uL (ref 0.9–3.3)

## 2013-10-26 LAB — COMPREHENSIVE METABOLIC PANEL (CC13)
ALT: 8 U/L (ref 0–55)
ANION GAP: 10 meq/L (ref 3–11)
AST: 14 U/L (ref 5–34)
Albumin: 3.5 g/dL (ref 3.5–5.0)
Alkaline Phosphatase: 73 U/L (ref 40–150)
BUN: 13.9 mg/dL (ref 7.0–26.0)
CO2: 22 mEq/L (ref 22–29)
Calcium: 10.3 mg/dL (ref 8.4–10.4)
Chloride: 109 mEq/L (ref 98–109)
Creatinine: 0.9 mg/dL (ref 0.6–1.1)
GLUCOSE: 142 mg/dL — AB (ref 70–140)
POTASSIUM: 4.1 meq/L (ref 3.5–5.1)
Sodium: 141 mEq/L (ref 136–145)
TOTAL PROTEIN: 6.8 g/dL (ref 6.4–8.3)
Total Bilirubin: 0.31 mg/dL (ref 0.20–1.20)

## 2013-10-26 MED ORDER — DEXAMETHASONE SODIUM PHOSPHATE 10 MG/ML IJ SOLN
10.0000 mg | Freq: Once | INTRAMUSCULAR | Status: AC
  Administered 2013-10-26: 10 mg via INTRAVENOUS

## 2013-10-26 MED ORDER — ONDANSETRON 8 MG/NS 50 ML IVPB
INTRAVENOUS | Status: AC
  Filled 2013-10-26: qty 8

## 2013-10-26 MED ORDER — ONDANSETRON 8 MG/50ML IVPB (CHCC)
8.0000 mg | Freq: Once | INTRAVENOUS | Status: AC
  Administered 2013-10-26: 8 mg via INTRAVENOUS

## 2013-10-26 MED ORDER — DEXAMETHASONE SODIUM PHOSPHATE 10 MG/ML IJ SOLN
INTRAMUSCULAR | Status: AC
  Filled 2013-10-26: qty 1

## 2013-10-26 MED ORDER — SODIUM CHLORIDE 0.9 % IJ SOLN
10.0000 mL | INTRAMUSCULAR | Status: DC | PRN
  Administered 2013-10-26: 10 mL
  Filled 2013-10-26: qty 10

## 2013-10-26 MED ORDER — SODIUM CHLORIDE 0.9 % IV SOLN
500.0000 mg/m2 | Freq: Once | INTRAVENOUS | Status: AC
  Administered 2013-10-26: 900 mg via INTRAVENOUS
  Filled 2013-10-26: qty 36

## 2013-10-26 MED ORDER — SODIUM CHLORIDE 0.9 % IV SOLN
Freq: Once | INTRAVENOUS | Status: AC
  Administered 2013-10-26: 10:00:00 via INTRAVENOUS

## 2013-10-26 MED ORDER — HEPARIN SOD (PORK) LOCK FLUSH 100 UNIT/ML IV SOLN
500.0000 [IU] | Freq: Once | INTRAVENOUS | Status: AC | PRN
  Administered 2013-10-26: 500 [IU]
  Filled 2013-10-26: qty 5

## 2013-10-26 NOTE — Patient Instructions (Signed)
[facility] Discharge Instructions for Patients Receiving Chemotherapy  Today you received the following chemotherapy agents Alimta.  To help prevent nausea and vomiting after your treatment, we encourage you to take your nausea medication.   If you develop nausea and vomiting that is not controlled by your nausea medication, call the clinic.   BELOW ARE SYMPTOMS THAT SHOULD BE REPORTED IMMEDIATELY:  *FEVER GREATER THAN 100.5 F  *CHILLS WITH OR WITHOUT FEVER  NAUSEA AND VOMITING THAT IS NOT CONTROLLED WITH YOUR NAUSEA MEDICATION  *UNUSUAL SHORTNESS OF BREATH  *UNUSUAL BRUISING OR BLEEDING  TENDERNESS IN MOUTH AND THROAT WITH OR WITHOUT PRESENCE OF ULCERS  *URINARY PROBLEMS  *BOWEL PROBLEMS  UNUSUAL RASH Items with * indicate a potential emergency and should be followed up as soon as possible.  Feel free to call the clinic you have any questions or concerns. The clinic phone number is [phone].

## 2013-10-26 NOTE — Progress Notes (Signed)
[place] [phone]   [phone]  OFFICE PROGRESS NOTE  [name], MD [address] [name] [address]  Diagnosis:  Metastatic non-small cell lung cancer initially diagnosed with locally advanced stage IIIB with right Pancoast tumor involving the vertebral body as well as the foraminal canal invasion with spinal cord compression in October of 2007.   Prior Therapy:  1. Status post concurrent chemoradiation with weekly carboplatin and paclitaxel, last dose was given November 18, 2005. 2. 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 3. Status post gamma knife radiotherapy to a solitary brain lesion located in the superior frontal area of the brain at [facility] in April of 2008. 4. Status post palliative radiotherapy to the lateral abdominal wall metastatic lesion under the care of Dr. Lisbeth Renshaw, completed March of 2009. 5. Status post 6 cycles of systemic chemotherapy with carboplatin and Alimta. Last dose was given July 26, 2007 with disease stabilization. 6. Gamma knife stereotactic radiotherapy to 2 brain lesions one involving the right frontal dural based as well as right parietal lesion performed on 05/07/2012 under the care of Dr. Vallarie Mare at [facility].  Current Therapy:  Maintenance chemotherapy with Alimta 500 mg per meter squared given every 3 weeks. The patient is status post 106 cycles.   CHEMOTHERAPY INTENT: Palliative/maintenance  CURRENT # OF CHEMOTHERAPY CYCLES: 107 CURRENT ANTIEMETICS: Zofran, dexamethasone and Compazine  CURRENT SMOKING STATUS: Quit smoking 10/07/2005  ORAL CHEMOTHERAPY AND CONSENT: None  CURRENT BISPHOSPHONATES USE: None  PAIN MANAGEMENT: 0/10 currently on morphine  NARCOTICS INDUCED CONSTIPATION: No constipation  LIVING WILL AND CODE STATUS: No CODE BLUE   INTERVAL HISTORY: DAIZEE FIRMIN 63 y.o. female returns to the clinic today for followup visit accompanied by her sister. The patient is feeling fine today with no specific complaints except for occasional nausea that usually resolves with Compazine. She is tolerating her maintenance treatment with single agent Alimta fairly well with no significant adverse effects. The patient denied having any chest pain, shortness of breath, cough or hemoptysis. She denied having any fever or chills, no nausea or vomiting. The patient denied having any weight loss or night sweats. She is here today for cycle #107. She had repeat CT scan of the chest, abdomen and pelvis performed recently and she is here for evaluation and discussion of her scan results.  MEDICAL HISTORY: Past Medical History  Diagnosis Date  . Hypertension   . Anemia   . H/O: pneumonia   . History of tobacco abuse quit 10/08    on nicotine patch  . Fibromyalgia   . Hypokalemia   . DJD (degenerative joint disease), cervical   . Thrush 12/11/2010  . 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 had chemo and radiation for lung ca    ALLERGIES:  is allergic to contrast media; co-trimoxazole injection; iohexol; and sulfa drugs cross reactors.  MEDICATIONS:  Current Outpatient Prescriptions  Medication Sig Dispense Refill  . ALPRAZolam (XANAX) 0.25 MG tablet Take 1 tablet (0.25 mg total) by mouth at bedtime as needed for sleep.  30 tablet  0  . amLODipine (NORVASC) 10 MG tablet Take 1 tablet (10 mg total) by mouth daily.  90 tablet  3  . antipyrine-benzocaine (AURALGAN) otic solution Place 1 drop into both ears daily.  10 mL  1  . fluconazole (  DIFLUCAN) 100 MG tablet Take 2 tablets by mouth on day one, then take 1 tablet  daily until completed. Hold your cholesterol medicine while taking the fluconazole  16 tablet  0  . fluticasone (FLONASE) 50 MCG/ACT nasal spray USE 1-2 SPRAYS IN EACH NOSTRIL DAILY  16 g  11  . folic acid (FOLVITE) 1 MG  tablet TAKE 1 TABLET BY MOUTH DAILY.  90 tablet  1  . lidocaine-prilocaine (EMLA) cream Apply topically as needed.  30 g  1  . lisinopril (PRINIVIL,ZESTRIL) 20 MG tablet TAKE 2 TABLETS (40 MG TOTAL) BY MOUTH DAILY.  60 tablet  5  . morphine (MS CONTIN) 60 MG 12 hr tablet Take 1 tablet (60 mg total) by mouth 2 (two) times daily.  60 tablet  0  . morphine (MSIR) 30 MG tablet Take 1 tablet (30 mg total) by mouth every 4 (four) hours as needed.  60 tablet  0  . olopatadine (PATANOL) 0.1 % ophthalmic solution Place 1 drop into both eyes 2 (two) times daily.  5 mL  11  . ondansetron (ZOFRAN) 8 MG tablet TAKE 1 TABLET (8 MG TOTAL) BY MOUTH EVERY 8 (EIGHT) HOURS AS NEEDED FOR NAUSEA.  30 tablet  2  . predniSONE (DELTASONE) 50 MG tablet 13 hrs, 7 hrs, and 1 hr before scheduled CT scans  9 tablet  1  . simvastatin (ZOCOR) 20 MG tablet Take 1 tablet (20 mg total) by mouth daily.  90 tablet  3   No current facility-administered medications for this visit.    SURGICAL HISTORY:  Past Surgical History  Procedure Laterality Date  . Cervical laminectomy  1995  . Other surgical history  2008    Gamma knife surgery to remove brain met   . 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 Knee surgery  1990    Left x 2  . Portacath placement  -[name]    TIP IN CAVOATRIAL JUNCTION    REVIEW OF SYSTEMS:  Constitutional: negative Eyes: negative Ears, nose, mouth, throat, and face: negative Respiratory: negative Cardiovascular: negative Gastrointestinal: negative Genitourinary:negative Integument/breast: negative Hematologic/lymphatic: negative Musculoskeletal:negative Neurological: negative Behavioral/Psych: negative Endocrine: negative Allergic/Immunologic: negative   PHYSICAL EXAMINATION: General appearance: alert, cooperative and no distress Head: Normocephalic, without obvious abnormality, atraumatic Neck: no adenopathy, no JVD, supple, symmetrical, trachea midline and  thyroid not enlarged, symmetric, no tenderness/mass/nodules Lymph nodes: Cervical, supraclavicular, and axillary nodes normal. Resp: clear to auscultation bilaterally Back: symmetric, no curvature. ROM normal. No CVA tenderness. Cardio: regular rate and rhythm, S1, S2 normal, no murmur, click, rub or gallop GI: soft, non-tender; bowel sounds normal; no masses,  no organomegaly Extremities: extremities normal, atraumatic, no cyanosis or edema Neurologic: Alert and oriented X 3, normal strength and tone. Normal symmetric reflexes. Normal coordination and gait  ECOG PERFORMANCE STATUS: 0 - Asymptomatic  Blood pressure 137/68, pulse 94, temperature 97.7 F (36.5 C), temperature source Oral, resp. rate 18, height _0  (1.6 m), weight 158 lb 4.8 oz (71.804 kg), SpO2 99.00%.  LABORATORY DATA: Lab Results  Component Value Date   WBC 9.3 10/26/2013   HGB 12.9 10/26/2013   HCT 39.0 10/26/2013   MCV 91.1 10/26/2013   PLT 361 10/26/2013      Chemistry      Component Value Date/Time   NA 143 10/05/2013 1059   NA 138 02/05/2013 1026   K 4.1 10/05/2013 1059   K 4.3 02/05/2013 1026  CL 105 02/05/2013 1026   CL 106 06/30/2012 1004   CO2 27 10/05/2013 1059   CO2 27 02/05/2013 1026   BUN 8.3 10/05/2013 1059   BUN 9 02/05/2013 1026   CREATININE 0.8 10/05/2013 1059   CREATININE 0.6 02/05/2013 1026      Component Value Date/Time   CALCIUM 10.3 10/05/2013 1059   CALCIUM 9.2 02/05/2013 1026   ALKPHOS 83 10/05/2013 1059   ALKPHOS 75 02/05/2013 1026   AST 17 10/05/2013 1059   AST 17 02/05/2013 1026   ALT 7 10/05/2013 1059   ALT 9 02/05/2013 1026   BILITOT 0.37 10/05/2013 1059   BILITOT 0.4 02/05/2013 1026       RADIOGRAPHIC STUDIES: Ct Chest W Contrast  10/22/2013   CLINICAL DATA:  Met. Lung ca to spine brain and omentum dx'd 09/2005 w/ recurrence to the brain 04/2012, xrt complete 03/2007, gamma knife complete 2008/2014,chemo in progress, shortness of breath, nausea, vomiting. Malignant neoplasm of upper  lobe of lung, unspecified laterality C34.10 (ICD-10-CM). Subsequent encounter.  EXAM: CT CHEST, ABDOMEN, AND PELVIS WITH CONTRAST  TECHNIQUE: Multidetector CT imaging of the chest, abdomen and pelvis was performed following the standard protocol during bolus administration of intravenous contrast.  CONTRAST:  131m OMNIPAQUE IOHEXOL 300 MG/ML  SOLN  COMPARISON:  08/20/2013  FINDINGS:  CT CHEST FINDINGS  Lungs/Pleura: Moderate centrilobular emphysema. Pleural-based right upper lobe opacity measures 1.1 cm on image 18 and is unchanged. Mild paramediastinal fibrosis is likely radiation induced. Lungs otherwise clear. Mild right apical pleural thickening is unchanged.  Heart/Mediastinum: No supraclavicular adenopathy. Air right-sided Port-A-Cath which terminates at the high right atrium. Aortic and branch vessel atherosclerosis. Mild cardiomegaly with coronary artery atherosclerosis within the LAD. No central pulmonary embolism, on this non-dedicated study. No mediastinal or hilar adenopathy.   CT ABDOMEN AND PELVIS FINDINGS  Hepatobiliary: Normal liver and gallbladder, without biliary ductal dilatation.  Pancreas: Normal, without mass or pancreatic ductal dilatation.  Spleen: Normal  Urinary tract: Normal adrenal glands. Too small to characterize interpolar left renal lesion. Subtle right proximal ureteric mucosal hyper enhancement, including on image 71 of series 2. This is been present on prior exams, an was more impressive on the 12/11/2012 exam. Normal urinary bladder.  Stomach/Bowel: Normal stomach, without wall thickening. Scattered colonic diverticula. Normal colon, appendix, and terminal ileum. Normal small bowel without abdominal ascites. No evidence of omental or peritoneal disease.  Vascular/Lymphatic: Aortic and branch vessel atherosclerosis. No retroperitoneal or retrocrural adenopathy. No pelvic adenopathy.  Reproductive: Normal uterus and adnexa.  Other:  No significant free fluid.  Musculoskeletal:  Similar sclerotic lesion in the left acetabulum. Subtle avascular necrosis involving both femoral heads. Vertebral augmentation at T4 and T7. Severe compression deformity T3. Similar.   IMPRESSION: CT CHEST IMPRESSION  1.  No acute process or evidence of metastatic disease in the chest. 2.  Atherosclerosis, including within the coronary arteries. 3. Similar right upper lobe scarring.  CT ABDOMEN AND PELVIS IMPRESSION  1. No acute process or evidence of metastatic disease in the abdomen or pelvis. 2. Subtle mucosal hyper enhancement within the proximal right ureter. This is been present on prior exams. Cannot exclude ascending urinary tract infection. 3. Chronic femoral head avascular necrosis bilaterally.   Electronically Signed   By: KAbigail MiyamotoM.D.   On: 10/22/2013 08:36   ASSESSMENT AND PLAN: This is a very pleasant 63years old white female with metastatic non-small cell lung cancer currently undergoing maintenance chemotherapy with single agent Alimta status post 106  cycles. The patient is tolerating her treatment fairly well with no significant adverse effects.  Her CT scan of the chest, abdomen and pelvis showed no evidence for disease progression. I discussed the scan results with the patient and her sister. I recommended for her to continue her current treatment with maintenance Alimta as scheduled. . For pain management, she will continue on her current pain medication with MS Contin and MS IR. She was given a refill for her pain medications. The patient would come back for followup visit in 3 weeks for evaluation before starting cycle #108. She was advised to call immediately if she has any concerning symptoms in the interval. The patient voices understanding of current disease status and treatment options and is in agreement with the current care plan.  All questions were answered. The patient knows to call the clinic with any problems, questions or concerns. We can certainly see the  patient much sooner if necessary.  Disclaimer: This note was dictated with voice recognition software. Similar sounding words can inadvertently be transcribed and may not be corrected upon review.

## 2013-10-26 NOTE — Telephone Encounter (Signed)
gv and printed appt sched and avs for pt for NOV and Dec....sed added tx.

## 2013-11-08 ENCOUNTER — Other Ambulatory Visit: Payer: Self-pay | Admitting: Nurse Practitioner

## 2013-11-11 ENCOUNTER — Telehealth: Payer: Self-pay | Admitting: Family Medicine

## 2013-11-11 DIAGNOSIS — E119 Type 2 diabetes mellitus without complications: Secondary | ICD-10-CM

## 2013-11-11 DIAGNOSIS — E7800 Pure hypercholesterolemia, unspecified: Secondary | ICD-10-CM

## 2013-11-11 NOTE — Telephone Encounter (Signed)
-----   Message from Ellamae Sia sent at 11/03/2013  5:27 PM EDT ----- Regarding: Lab orders for Thursday, 11.5.15 Lab orders for a f/u appt

## 2013-11-12 ENCOUNTER — Other Ambulatory Visit (INDEPENDENT_AMBULATORY_CARE_PROVIDER_SITE_OTHER): Payer: Medicare Other

## 2013-11-12 DIAGNOSIS — E7800 Pure hypercholesterolemia, unspecified: Secondary | ICD-10-CM

## 2013-11-12 DIAGNOSIS — E119 Type 2 diabetes mellitus without complications: Secondary | ICD-10-CM | POA: Diagnosis not present

## 2013-11-12 LAB — LIPID PANEL
CHOL/HDL RATIO: 5
Cholesterol: 148 mg/dL (ref 0–200)
HDL: 29.2 mg/dL — AB (ref >39.00)
LDL CALC: 92 mg/dL (ref 0–99)
NONHDL: 118.8
TRIGLYCERIDES: 134 mg/dL (ref 0.0–149.0)
VLDL: 26.8 mg/dL (ref 0.0–40.0)

## 2013-11-12 LAB — HEMOGLOBIN A1C: Hgb A1c MFr Bld: 5.8 % (ref 4.6–6.5)

## 2013-11-15 ENCOUNTER — Other Ambulatory Visit: Payer: Self-pay | Admitting: Medical Oncology

## 2013-11-15 ENCOUNTER — Encounter: Payer: Self-pay | Admitting: Pharmacist

## 2013-11-15 ENCOUNTER — Encounter: Payer: Self-pay | Admitting: *Deleted

## 2013-11-15 DIAGNOSIS — C3411 Malignant neoplasm of upper lobe, right bronchus or lung: Secondary | ICD-10-CM

## 2013-11-16 ENCOUNTER — Other Ambulatory Visit (HOSPITAL_BASED_OUTPATIENT_CLINIC_OR_DEPARTMENT_OTHER): Payer: Medicare Other

## 2013-11-16 ENCOUNTER — Encounter: Payer: Self-pay | Admitting: Physician Assistant

## 2013-11-16 ENCOUNTER — Ambulatory Visit (HOSPITAL_BASED_OUTPATIENT_CLINIC_OR_DEPARTMENT_OTHER): Payer: Medicare Other

## 2013-11-16 ENCOUNTER — Telehealth: Payer: Self-pay | Admitting: Internal Medicine

## 2013-11-16 ENCOUNTER — Ambulatory Visit (HOSPITAL_BASED_OUTPATIENT_CLINIC_OR_DEPARTMENT_OTHER): Payer: Medicare Other | Admitting: Physician Assistant

## 2013-11-16 VITALS — BP 133/63 | HR 91 | Temp 98.6°F | Resp 18 | Ht 63.0 in | Wt 160.7 lb

## 2013-11-16 DIAGNOSIS — B370 Candidal stomatitis: Secondary | ICD-10-CM

## 2013-11-16 DIAGNOSIS — C7951 Secondary malignant neoplasm of bone: Secondary | ICD-10-CM

## 2013-11-16 DIAGNOSIS — C3411 Malignant neoplasm of upper lobe, right bronchus or lung: Secondary | ICD-10-CM

## 2013-11-16 DIAGNOSIS — C3410 Malignant neoplasm of upper lobe, unspecified bronchus or lung: Secondary | ICD-10-CM

## 2013-11-16 DIAGNOSIS — Z5111 Encounter for antineoplastic chemotherapy: Secondary | ICD-10-CM | POA: Diagnosis not present

## 2013-11-16 DIAGNOSIS — C786 Secondary malignant neoplasm of retroperitoneum and peritoneum: Secondary | ICD-10-CM | POA: Diagnosis not present

## 2013-11-16 DIAGNOSIS — C7931 Secondary malignant neoplasm of brain: Secondary | ICD-10-CM

## 2013-11-16 LAB — COMPREHENSIVE METABOLIC PANEL (CC13)
ALT: 9 U/L (ref 0–55)
AST: 15 U/L (ref 5–34)
Albumin: 3.5 g/dL (ref 3.5–5.0)
Alkaline Phosphatase: 75 U/L (ref 40–150)
Anion Gap: 8 mEq/L (ref 3–11)
BILIRUBIN TOTAL: 0.26 mg/dL (ref 0.20–1.20)
BUN: 5.9 mg/dL — ABNORMAL LOW (ref 7.0–26.0)
CO2: 27 mEq/L (ref 22–29)
Calcium: 9.8 mg/dL (ref 8.4–10.4)
Chloride: 108 mEq/L (ref 98–109)
Creatinine: 0.8 mg/dL (ref 0.6–1.1)
GLUCOSE: 148 mg/dL — AB (ref 70–140)
Potassium: 4 mEq/L (ref 3.5–5.1)
SODIUM: 143 meq/L (ref 136–145)
Total Protein: 7.1 g/dL (ref 6.4–8.3)

## 2013-11-16 LAB — CBC WITH DIFFERENTIAL/PLATELET
BASO%: 0.1 % (ref 0.0–2.0)
Basophils Absolute: 0 10*3/uL (ref 0.0–0.1)
EOS%: 0.7 % (ref 0.0–7.0)
Eosinophils Absolute: 0.1 10*3/uL (ref 0.0–0.5)
HCT: 36.4 % (ref 34.8–46.6)
HGB: 11.9 g/dL (ref 11.6–15.9)
LYMPH%: 21.6 % (ref 14.0–49.7)
MCH: 30.4 pg (ref 25.1–34.0)
MCHC: 32.7 g/dL (ref 31.5–36.0)
MCV: 92.9 fL (ref 79.5–101.0)
MONO#: 0.7 10*3/uL (ref 0.1–0.9)
MONO%: 9.3 % (ref 0.0–14.0)
NEUT#: 5.1 10*3/uL (ref 1.5–6.5)
NEUT%: 68.3 % (ref 38.4–76.8)
Platelets: 345 10*3/uL (ref 145–400)
RBC: 3.92 10*6/uL (ref 3.70–5.45)
RDW: 14.1 % (ref 11.2–14.5)
WBC: 7.4 10*3/uL (ref 3.9–10.3)
lymph#: 1.6 10*3/uL (ref 0.9–3.3)

## 2013-11-16 LAB — RESEARCH LABS

## 2013-11-16 MED ORDER — ONDANSETRON 8 MG/NS 50 ML IVPB
INTRAVENOUS | Status: AC
  Filled 2013-11-16: qty 8

## 2013-11-16 MED ORDER — SODIUM CHLORIDE 0.9 % IV SOLN
Freq: Once | INTRAVENOUS | Status: AC
  Administered 2013-11-16: 11:00:00 via INTRAVENOUS

## 2013-11-16 MED ORDER — DEXAMETHASONE SODIUM PHOSPHATE 10 MG/ML IJ SOLN
10.0000 mg | Freq: Once | INTRAMUSCULAR | Status: AC
  Administered 2013-11-16: 10 mg via INTRAVENOUS

## 2013-11-16 MED ORDER — HEPARIN SOD (PORK) LOCK FLUSH 100 UNIT/ML IV SOLN
500.0000 [IU] | Freq: Once | INTRAVENOUS | Status: AC | PRN
  Administered 2013-11-16: 500 [IU]
  Filled 2013-11-16: qty 5

## 2013-11-16 MED ORDER — ONDANSETRON 8 MG/50ML IVPB (CHCC)
8.0000 mg | Freq: Once | INTRAVENOUS | Status: AC
  Administered 2013-11-16: 8 mg via INTRAVENOUS

## 2013-11-16 MED ORDER — CYANOCOBALAMIN 1000 MCG/ML IJ SOLN
1000.0000 ug | Freq: Once | INTRAMUSCULAR | Status: AC
  Administered 2013-11-16: 1000 ug via INTRAMUSCULAR

## 2013-11-16 MED ORDER — DEXAMETHASONE SODIUM PHOSPHATE 10 MG/ML IJ SOLN
INTRAMUSCULAR | Status: AC
  Filled 2013-11-16: qty 1

## 2013-11-16 MED ORDER — MORPHINE SULFATE ER 60 MG PO TBCR: 60.0000 mg | EXTENDED_RELEASE_TABLET | Freq: Two times a day (BID) | ORAL | Status: DC

## 2013-11-16 MED ORDER — ALPRAZOLAM 0.25 MG PO TABS: 0.2500 mg | ORAL_TABLET | Freq: Every evening | ORAL | Status: DC | PRN

## 2013-11-16 MED ORDER — SODIUM CHLORIDE 0.9 % IV SOLN
500.0000 mg/m2 | Freq: Once | INTRAVENOUS | Status: AC
  Administered 2013-11-16: 900 mg via INTRAVENOUS
  Filled 2013-11-16: qty 36

## 2013-11-16 MED ORDER — CYANOCOBALAMIN 1000 MCG/ML IJ SOLN
INTRAMUSCULAR | Status: AC
  Filled 2013-11-16: qty 1

## 2013-11-16 MED ORDER — MORPHINE SULFATE 30 MG PO TABS: 30.0000 mg | ORAL_TABLET | ORAL | Status: DC | PRN

## 2013-11-16 MED ORDER — FLUCONAZOLE 100 MG PO TABS: ORAL_TABLET | ORAL | Status: DC

## 2013-11-16 MED ORDER — SODIUM CHLORIDE 0.9 % IJ SOLN
10.0000 mL | INTRAMUSCULAR | Status: DC | PRN
  Administered 2013-11-16: 10 mL
  Filled 2013-11-16: qty 10

## 2013-11-16 NOTE — Progress Notes (Signed)
11/16/13 @9 :10 am, BMS [id number]:   Mrs. Delashmit into the [facility] for regular assessment by [name], PA and to receive pemetrexed maintenance therapy.  She completed the LCSS and the EQ-5D-3L.  She will have research labs drawn along with her [facility] labs that are ordered.  She has not had any biopsies since her last data collection point.  She did have restaging scans done that did not show progression.  Will review her chart after this visit for research data entry purposes.

## 2013-11-16 NOTE — Progress Notes (Addendum)
[place] Telephone:([phone]) [phone]   [phone]  OFFICE PROGRESS NOTE  [name], MD [address]. [name] [address]  Diagnosis:  Metastatic non-small cell lung cancer initially diagnosed with locally advanced stage IIIB with right Pancoast tumor involving the vertebral body as well as the foraminal canal invasion with spinal cord compression in October of 2007.   Prior Therapy:  1. Status post concurrent chemoradiation with weekly carboplatin and paclitaxel, last dose was given November 18, 2005. 2. 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 3. Status post gamma knife radiotherapy to a solitary brain lesion located in the superior frontal area of the brain at [facility] in April of 2008. 4. Status post palliative radiotherapy to the lateral abdominal wall metastatic lesion under the care of Dr. Lisbeth Renshaw, completed March of 2009. 5. Status post 6 cycles of systemic chemotherapy with carboplatin and Alimta. Last dose was given July 26, 2007 with disease stabilization. 6. Gamma knife stereotactic radiotherapy to 2 brain lesions one involving the right frontal dural based as well as right parietal lesion performed on 05/07/2012 under the care of Dr. Vallarie Mare at [facility].  Current Therapy:  Maintenance chemotherapy with Alimta 500 mg per meter squared given every 3 weeks. The patient is status post 107 cycles.   CHEMOTHERAPY INTENT: Palliative/maintenance  CURRENT # OF CHEMOTHERAPY CYCLES: 108 CURRENT ANTIEMETICS: Zofran, dexamethasone and Compazine  CURRENT SMOKING STATUS: Quit smoking 10/07/2005  ORAL CHEMOTHERAPY AND CONSENT: None  CURRENT BISPHOSPHONATES USE: None  PAIN MANAGEMENT: 0/10 currently on morphine  NARCOTICS INDUCED CONSTIPATION: No constipation  LIVING WILL AND CODE STATUS: No CODE BLUE   INTERVAL HISTORY: Claribel Sachs  Danielle [name] 63 y.o. female returns to the clinic today for followup visit accompanied by her daughter. The patient is feeling fine today with no specific complaints except for occasional nausea that usually resolves with Compazine. She is tolerating her maintenance treatment with single agent Alimta fairly well with no significant adverse effects. The patient denied having any chest pain, shortness of breath, cough or hemoptysis. She denied having any fever or chills, no nausea or vomiting. The patient denied having any weight loss or night sweats. She is here today for cycle #108.  She requests refill prescriptions for her MS Contin, MSIR and Xanax.  MEDICAL HISTORY: Past Medical History  Diagnosis Date  . Hypertension   . Anemia   . H/O: pneumonia   . History of tobacco abuse quit 10/08    on nicotine patch  . Fibromyalgia   . Hypokalemia   . DJD (degenerative joint disease), cervical   . Thrush 12/11/2010  . 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 had chemo and radiation for lung ca    ALLERGIES:  is allergic to contrast media; co-trimoxazole injection; iohexol; and sulfa drugs cross reactors.  MEDICATIONS:  Current Outpatient Prescriptions  Medication Sig Dispense Refill  . ALPRAZolam (XANAX) 0.25 MG tablet Take 1 tablet (0.25 mg total) by mouth at bedtime as needed for sleep. 30 tablet 0  . amLODipine (NORVASC) 10 MG tablet Take 1 tablet (10 mg total) by mouth daily. 90 tablet 3  . antipyrine-benzocaine (AURALGAN) otic solution Place 1 drop into both ears daily. 10 mL 1  . fluconazole (DIFLUCAN) 100 MG tablet Take 2 tablets by mouth on day one, then take 1 tablet  daily until  completed. Hold your cholesterol medicine while taking the fluconazole 16 tablet 0  . fluticasone (FLONASE) 50 MCG/ACT nasal spray USE 1-2 SPRAYS IN EACH NOSTRIL DAILY 16 g 11  . folic acid (FOLVITE) 1 MG tablet TAKE 1 TABLET BY MOUTH DAILY. 90 tablet 1  . lidocaine-prilocaine (EMLA)  cream Apply topically as needed. 30 g 1  . lisinopril (PRINIVIL,ZESTRIL) 20 MG tablet TAKE 2 TABLETS (40 MG TOTAL) BY MOUTH DAILY. 60 tablet 5  . morphine (MS CONTIN) 60 MG 12 hr tablet Take 1 tablet (60 mg total) by mouth 2 (two) times daily. 60 tablet 0  . morphine (MSIR) 30 MG tablet Take 1 tablet (30 mg total) by mouth every 4 (four) hours as needed. 60 tablet 0  . olopatadine (PATANOL) 0.1 % ophthalmic solution Place 1 drop into both eyes 2 (two) times daily. 5 mL 11  . ondansetron (ZOFRAN) 8 MG tablet TAKE 1 TABLET (8 MG TOTAL) BY MOUTH EVERY 8 (EIGHT) HOURS AS NEEDED FOR NAUSEA. 30 tablet 2  . simvastatin (ZOCOR) 20 MG tablet Take 1 tablet (20 mg total) by mouth daily. 90 tablet 3  . predniSONE (DELTASONE) 50 MG tablet 13 hrs, 7 hrs, and 1 hr before scheduled CT scans 9 tablet 1   No current facility-administered medications for this visit.    SURGICAL HISTORY:  Past Surgical History  Procedure Laterality Date  . Cervical laminectomy  1995  . Other surgical history  2008    Gamma knife surgery to remove brain met   . 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 Knee surgery  1990    Left x 2  . Portacath placement  -[name]    TIP IN CAVOATRIAL JUNCTION    REVIEW OF SYSTEMS:  Constitutional: negative Eyes: negative Ears, nose, mouth, throat, and face: negative Respiratory: negative Cardiovascular: negative Gastrointestinal: negative Genitourinary:negative Integument/breast: negative Hematologic/lymphatic: negative Musculoskeletal:negative Neurological: negative Behavioral/Psych: negative Endocrine: negative Allergic/Immunologic: negative   PHYSICAL EXAMINATION: General appearance: alert, cooperative and no distress Head: Normocephalic, without obvious abnormality, atraumatic Neck: no adenopathy, no JVD, supple, symmetrical, trachea midline and thyroid not enlarged, symmetric, no tenderness/mass/nodules Lymph nodes: Cervical, supraclavicular,  and axillary nodes normal. Resp: clear to auscultation bilaterally Back: symmetric, no curvature. ROM normal. No CVA tenderness. Cardio: regular rate and rhythm, S1, S2 normal, no murmur, click, rub or gallop GI: soft, non-tender; bowel sounds normal; no masses,  no organomegaly Extremities: extremities normal, atraumatic, no cyanosis or edema Neurologic: Alert and oriented X 3, normal strength and tone. Normal symmetric reflexes. Normal coordination and gait Mouth: Reveals thrush  ECOG PERFORMANCE STATUS: 0 - Asymptomatic  Blood pressure 133/63, pulse 91, temperature 98.6 F (37 C), temperature source Oral, resp. rate 18, height _0  (1.6 m), weight 160 lb 11.2 oz (72.893 kg).  LABORATORY DATA: Lab Results  Component Value Date   WBC 7.4 11/16/2013   HGB 11.9 11/16/2013   HCT 36.4 11/16/2013   MCV 92.9 11/16/2013   PLT 345 11/16/2013      Chemistry      Component Value Date/Time   NA 143 11/16/2013 0922   NA 138 02/05/2013 1026   K 4.0 11/16/2013 0922   K 4.3 02/05/2013 1026   CL 105 02/05/2013 1026   CL 106 06/30/2012 1004   CO2 27 11/16/2013 0922   CO2 27 02/05/2013 1026   BUN 5.9* 11/16/2013 0922   BUN 9 02/05/2013 1026   CREATININE 0.8 11/16/2013 0922  CREATININE 0.6 02/05/2013 1026      Component Value Date/Time   CALCIUM 9.8 11/16/2013 0922   CALCIUM 9.2 02/05/2013 1026   ALKPHOS 75 11/16/2013 0922   ALKPHOS 75 02/05/2013 1026   AST 15 11/16/2013 0922   AST 17 02/05/2013 1026   ALT 9 11/16/2013 0922   ALT 9 02/05/2013 1026   BILITOT 0.26 11/16/2013 0922   BILITOT 0.4 02/05/2013 1026       RADIOGRAPHIC STUDIES: Ct Chest W Contrast  10/22/2013   CLINICAL DATA:  Met. Lung ca to spine brain and omentum dx'd 09/2005 w/ recurrence to the brain 04/2012, xrt complete 03/2007, gamma knife complete 2008/2014,chemo in progress, shortness of breath, nausea, vomiting. Malignant neoplasm of upper lobe of lung, unspecified laterality C34.10 (ICD-10-CM). Subsequent  encounter.  EXAM: CT CHEST, ABDOMEN, AND PELVIS WITH CONTRAST  TECHNIQUE: Multidetector CT imaging of the chest, abdomen and pelvis was performed following the standard protocol during bolus administration of intravenous contrast.  CONTRAST:  14m OMNIPAQUE IOHEXOL 300 MG/ML  SOLN  COMPARISON:  08/20/2013  FINDINGS:  CT CHEST FINDINGS  Lungs/Pleura: Moderate centrilobular emphysema. Pleural-based right upper lobe opacity measures 1.1 cm on image 18 and is unchanged. Mild paramediastinal fibrosis is likely radiation induced. Lungs otherwise clear. Mild right apical pleural thickening is unchanged.  Heart/Mediastinum: No supraclavicular adenopathy. Air right-sided Port-A-Cath which terminates at the high right atrium. Aortic and branch vessel atherosclerosis. Mild cardiomegaly with coronary artery atherosclerosis within the LAD. No central pulmonary embolism, on this non-dedicated study. No mediastinal or hilar adenopathy.   CT ABDOMEN AND PELVIS FINDINGS  Hepatobiliary: Normal liver and gallbladder, without biliary ductal dilatation.  Pancreas: Normal, without mass or pancreatic ductal dilatation.  Spleen: Normal  Urinary tract: Normal adrenal glands. Too small to characterize interpolar left renal lesion. Subtle right proximal ureteric mucosal hyper enhancement, including on image 71 of series 2. This is been present on prior exams, an was more impressive on the 12/11/2012 exam. Normal urinary bladder.  Stomach/Bowel: Normal stomach, without wall thickening. Scattered colonic diverticula. Normal colon, appendix, and terminal ileum. Normal small bowel without abdominal ascites. No evidence of omental or peritoneal disease.  Vascular/Lymphatic: Aortic and branch vessel atherosclerosis. No retroperitoneal or retrocrural adenopathy. No pelvic adenopathy.  Reproductive: Normal uterus and adnexa.  Other:  No significant free fluid.  Musculoskeletal: Similar sclerotic lesion in the left acetabulum. Subtle avascular  necrosis involving both femoral heads. Vertebral augmentation at T4 and T7. Severe compression deformity T3. Similar.   IMPRESSION: CT CHEST IMPRESSION  1.  No acute process or evidence of metastatic disease in the chest. 2.  Atherosclerosis, including within the coronary arteries. 3. Similar right upper lobe scarring.  CT ABDOMEN AND PELVIS IMPRESSION  1. No acute process or evidence of metastatic disease in the abdomen or pelvis. 2. Subtle mucosal hyper enhancement within the proximal right ureter. This is been present on prior exams. Cannot exclude ascending urinary tract infection. 3. Chronic femoral head avascular necrosis bilaterally.   Electronically Signed   By: KAbigail MiyamotoM.D.   On: 10/22/2013 08:36   ASSESSMENT AND PLAN: This is a very pleasant 63years old white female with metastatic non-small cell lung cancer currently undergoing maintenance chemotherapy with single agent Alimta status post 107 cycles. The patient is tolerating her treatment fairly well with no significant adverse effects.  Her recentCT scan of the chest, abdomen and pelvis showed no evidence for disease progression. The patient was discussed with and also seen by Dr. MJulien Nordmann She  will proceed with cycle #108 of her maintenance chemotherapy with single agent Alimta today as scheduled. For the oral candidiasis a prescription for Diflucan was sent her pharmacy of record. Patient knows not to take her cholesterol medication while she is on the Diflucan. She was given refill prescriptions for her MS Contin, MSIR and Xanax at the current doses and current quantities. She'll return in 3 weeks prior to the start of cycle #109 for another symptom management visit.  She was advised to call immediately if she has any concerning symptoms in the interval. The patient voices understanding of current disease status and treatment options and is in agreement with the current care plan.  All questions were answered. The patient knows to  call the clinic with any problems, questions or concerns. We can certainly see the patient much sooner if necessary.  Disclaimer: This note was dictated with voice recognition software. Similar sounding words can inadvertently be transcribed and may not be corrected upon review.  Carlton Adam PA-C  ADDENDUM: Hematology/Oncology Attending: I had a face to face encounter with the patient. I recommended her care plan.This is a very pleasant 63 years old white female with metastatic non-small cell lung cancer, adenocarcinoma. She is currently on maintenance chemotherapy with single agent Alimta status post 107 cycles. She is tolerating her treatment fairly well with no significant adverse effects. The recent CT scan of the chest, abdomen and pelvis showed no evidence for disease progression. I discussed the scan results with the patient and her daughter. I recommended for her to continue on maintenance chemotherapy with single agent Alimta. She would proceed with cycle #108 today. She would come back for follow-up visit in 3 weeks for reevaluation before starting cycle 109. She was advised to call immediately if she has any concerning symptoms in the interval.  Disclaimer: This note was dictated with voice recognition software. Similar sounding words can inadvertently be transcribed and may be missed upon review. Eilleen Kempf., MD 11/20/2013

## 2013-11-16 NOTE — Telephone Encounter (Signed)
gv and printed appt sched and avs fo rpt for DEC....pt ok and awareof seeing MD the day b4 tx

## 2013-11-16 NOTE — Patient Instructions (Signed)
[place] Discharge Instructions for Patients Receiving Chemotherapy  Today you received the following chemotherapy agents:  Alimta  To help prevent nausea and vomiting after your treatment, we encourage you to take your nausea medication as ordered per MD.   If you develop nausea and vomiting that is not controlled by your nausea medication, call the clinic.   BELOW ARE SYMPTOMS THAT SHOULD BE REPORTED IMMEDIATELY:  *FEVER GREATER THAN 100.5 F  *CHILLS WITH OR WITHOUT FEVER  NAUSEA AND VOMITING THAT IS NOT CONTROLLED WITH YOUR NAUSEA MEDICATION  *UNUSUAL SHORTNESS OF BREATH  *UNUSUAL BRUISING OR BLEEDING  TENDERNESS IN MOUTH AND THROAT WITH OR WITHOUT PRESENCE OF ULCERS  *URINARY PROBLEMS  *BOWEL PROBLEMS  UNUSUAL RASH Items with * indicate a potential emergency and should be followed up as soon as possible.  Feel free to call the clinic you have any questions or concerns. The clinic phone number is [phone].

## 2013-11-18 ENCOUNTER — Other Ambulatory Visit: Payer: Self-pay | Admitting: Physician Assistant

## 2013-11-18 DIAGNOSIS — C3410 Malignant neoplasm of upper lobe, unspecified bronchus or lung: Secondary | ICD-10-CM

## 2013-11-18 NOTE — Patient Instructions (Signed)
Take the Diflucan as prescribed to treat your thrush, remember not to take your cholesterol medication while you're taking the Diflucan Follow-up in 3 weeks prior to your next scheduled cycle of maintenance chemotherapy

## 2013-11-19 ENCOUNTER — Ambulatory Visit (INDEPENDENT_AMBULATORY_CARE_PROVIDER_SITE_OTHER): Payer: Medicare Other | Admitting: Family Medicine

## 2013-11-19 ENCOUNTER — Encounter: Payer: Self-pay | Admitting: Family Medicine

## 2013-11-19 ENCOUNTER — Telehealth: Payer: Self-pay

## 2013-11-19 VITALS — BP 100/60 | HR 89 | Temp 98.0°F | Ht 63.0 in | Wt 160.5 lb

## 2013-11-19 DIAGNOSIS — E7800 Pure hypercholesterolemia, unspecified: Secondary | ICD-10-CM

## 2013-11-19 DIAGNOSIS — I10 Essential (primary) hypertension: Secondary | ICD-10-CM

## 2013-11-19 DIAGNOSIS — E119 Type 2 diabetes mellitus without complications: Secondary | ICD-10-CM | POA: Diagnosis not present

## 2013-11-19 LAB — HM DIABETES FOOT EXAM

## 2013-11-19 MED ORDER — ANTIPYRINE-BENZOCAINE 5.4-1.4 % OT SOLN: 1.0000 [drp] | Freq: Every day | OTIC | Status: DC

## 2013-11-19 NOTE — Assessment & Plan Note (Signed)
Well controlled. Continue current medication.  

## 2013-11-19 NOTE — Progress Notes (Signed)
63 year old female on 22rd!!! chemo treatment for met lung cancer presents for 3 month follow up DM    Hypertension: Improved control on amlodipine 10 and lisiniopril 20 daily. BP Readings from Last 3 Encounters:  11/19/13 100/60  11/16/13 133/63  10/26/13 137/68   Using medication without problems or lightheadedness: None Chest pain with exertion: None, occ heartburn after chemo but gone with pepcid Edema:None Short of breath:Stable Average home BPs:120/70 Other issues: She has been working on healthy eating.  Diabetes: Well controlled on no med. Lab Results  Component Value Date   HGBA1C 5.8 11/12/2013  Using medications without difficulties: None Hypoglycemic episodes: Not checking Hyperglycemic episodes:? Feet problems:None eye exam within last year: Due, has appt scheduled in next month.  Elevated Cholesterol: New LDL goal with CAD seen on CT is LDL < 70. She is not quite at goal yet. She has had to hold the simvastatin several times given recurrence of thrush.  She has restarted simvastatin in last 3 months at 20 mg daily *At last chest CT: atherosclerosis seen in aortic and branch vessels. Asymptomatic. Pt does no want further eval for CAD. Lab Results  Component Value Date   CHOL 148 11/12/2013   HDL 29.20* 11/12/2013   LDLCALC 92 11/12/2013   LDLDIRECT 105.5 08/06/2013   TRIG 134.0 11/12/2013   CHOLHDL 5 11/12/2013  Using medications without problems: None Muscle aches: None Diet compliance: Good Exercise: limited but trying to move some. Other complaints:    Review of Systems  Constitutional: Negative for fever and stable fatigue.  HENT: Negative for ear pain.  Eyes: Negative for pain.  Respiratory: Negative for chest tightness.  Cardiovascular: Negative for chest pain, palpitations and leg swelling.  Gastrointestinal: Negative for abdominal pain.  Genitourinary: Negative for dysuria.       Objective:   Physical Exam  Constitutional:  Vital signs are normal. She appears well-developed and well-nourished. She is cooperative. Non-toxic appearance. She does not appear ill. No distress.  HENT:  Head: Normocephalic.  Right Ear: Hearing, tympanic membrane, external ear and ear canal normal. Tympanic membrane is not erythematous, not retracted and not bulging.  Left Ear: Hearing, tympanic membrane, external ear and ear canal normal. Tympanic membrane is not erythematous, not retracted and not bulging.  Nose: No mucosal edema or rhinorrhea. Right sinus exhibits no maxillary sinus tenderness and no frontal sinus tenderness. Left sinus exhibits no maxillary sinus tenderness and no frontal sinus tenderness.  Mouth/Throat: Uvula is midline, oropharynx is clear and moist and mucous membranes are normal.  Eyes: Conjunctivae, EOM and lids are normal. Pupils are equal, round, and reactive to light. Lids are everted and swept, no foreign bodies found.  Neck: Trachea normal and normal range of motion. Neck supple. Carotid bruit is not present. No mass and no thyromegaly present.  Cardiovascular: Normal rate, regular rhythm, S1 normal, S2 normal, normal heart sounds, intact distal pulses and normal pulses. Exam reveals no gallop and no friction rub.  No murmur heard. Pulmonary/Chest: Effort normal. Not tachypneic. No respiratory distress. She has decreased breath sounds. She has no wheezes. She has no rhonchi. She has no rales.  Unchanged from previous exam  Abdominal: Soft. Normal appearance and bowel sounds are normal. There is no tenderness.  Neurological: She is alert.  Skin: Skin is warm, dry and intact. No rash noted.  Psychiatric: Her speech is normal and behavior is normal. Judgment and thought content normal. Her mood appears not anxious. Cognition and memory are normal. She  does not exhibit a depressed mood.   Diabetic foot exam: Normal inspection No skin breakdown No calluses  Normal DP pulses Normal sensation to light  touch and monofilament Nails normal

## 2013-11-19 NOTE — Telephone Encounter (Signed)
Ms. Nachtigal notified that Auralgan ear drops are no longer available and per Dr. Diona Browner if she continues to have ear pain to schedule appointment for further evaluation.  Patient states understanding.

## 2013-11-19 NOTE — Assessment & Plan Note (Signed)
LDL not quite at goal ( much better than at last check) but she is still been unable to take simvastatin continuously.  Will work on healthy eating habits, increase movement as tolerated and continue current dose.

## 2013-11-19 NOTE — Telephone Encounter (Signed)
Pt does not have an infection. Let her know drops are no longer availbale. If she continues to have ear pain, make appt to eval further.

## 2013-11-19 NOTE — Patient Instructions (Addendum)
Continue current meds. Increase veggies fat in place of animal fats. Exercise walk as much as allowed. Look into prevnar vaccines.

## 2013-11-19 NOTE — Progress Notes (Signed)
Pre visit review using our clinic review tool, if applicable. No additional management support is needed unless otherwise documented below in the visit note. 

## 2013-11-19 NOTE — Assessment & Plan Note (Signed)
At goal on no med. Diet control Encouraged exercise, weight loss, healthy eating habits.

## 2013-11-19 NOTE — Telephone Encounter (Signed)
Spoke with [place] at [facility].  She states there is no generic benzocaine/antipyrine drops.  She states all those types of drops have been taken off the market.  She states the only thing available is the antibiotic/steroid combination or a hydrocortisone with acetic acid drop.

## 2013-11-19 NOTE — Telephone Encounter (Signed)
Do they have generic benzocaine/antipyrine drops?  Otherwise I am not aware of any substitutes, pt should ask at other pharmacies.

## 2013-11-19 NOTE — Telephone Encounter (Signed)
[place] at [facility] left v/m; auralgan otic solution is no longer available(does not have at any other pharmacy). Request substitute med sent to [facility].

## 2013-11-19 NOTE — Telephone Encounter (Signed)
Left message for Ms. Cofer to return my call.

## 2013-12-06 ENCOUNTER — Telehealth: Payer: Self-pay | Admitting: Internal Medicine

## 2013-12-06 ENCOUNTER — Ambulatory Visit (HOSPITAL_BASED_OUTPATIENT_CLINIC_OR_DEPARTMENT_OTHER): Payer: Medicare Other | Admitting: Internal Medicine

## 2013-12-06 ENCOUNTER — Telehealth: Payer: Self-pay | Admitting: *Deleted

## 2013-12-06 ENCOUNTER — Other Ambulatory Visit (HOSPITAL_BASED_OUTPATIENT_CLINIC_OR_DEPARTMENT_OTHER): Payer: Medicare Other

## 2013-12-06 ENCOUNTER — Other Ambulatory Visit: Payer: Self-pay | Admitting: Internal Medicine

## 2013-12-06 ENCOUNTER — Encounter: Payer: Self-pay | Admitting: Internal Medicine

## 2013-12-06 VITALS — BP 102/56 | HR 82 | Temp 98.0°F | Resp 18 | Ht 63.0 in | Wt 156.7 lb

## 2013-12-06 DIAGNOSIS — C3411 Malignant neoplasm of upper lobe, right bronchus or lung: Secondary | ICD-10-CM

## 2013-12-06 DIAGNOSIS — C7931 Secondary malignant neoplasm of brain: Secondary | ICD-10-CM | POA: Diagnosis not present

## 2013-12-06 DIAGNOSIS — R52 Pain, unspecified: Secondary | ICD-10-CM | POA: Diagnosis not present

## 2013-12-06 DIAGNOSIS — R110 Nausea: Secondary | ICD-10-CM | POA: Diagnosis not present

## 2013-12-06 LAB — CBC WITH DIFFERENTIAL/PLATELET
BASO%: 0.8 % (ref 0.0–2.0)
BASOS ABS: 0.1 10*3/uL (ref 0.0–0.1)
EOS%: 0.8 % (ref 0.0–7.0)
Eosinophils Absolute: 0.1 10*3/uL (ref 0.0–0.5)
HEMATOCRIT: 36.9 % (ref 34.8–46.6)
HEMOGLOBIN: 11.8 g/dL (ref 11.6–15.9)
LYMPH#: 2 10*3/uL (ref 0.9–3.3)
LYMPH%: 27.3 % (ref 14.0–49.7)
MCH: 30 pg (ref 25.1–34.0)
MCHC: 32.1 g/dL (ref 31.5–36.0)
MCV: 93.7 fL (ref 79.5–101.0)
MONO#: 1 10*3/uL — AB (ref 0.1–0.9)
MONO%: 13.9 % (ref 0.0–14.0)
NEUT#: 4.2 10*3/uL (ref 1.5–6.5)
NEUT%: 57.2 % (ref 38.4–76.8)
Platelets: 423 10*3/uL — ABNORMAL HIGH (ref 145–400)
RBC: 3.94 10*6/uL (ref 3.70–5.45)
RDW: 14 % (ref 11.2–14.5)
WBC: 7.4 10*3/uL (ref 3.9–10.3)

## 2013-12-06 LAB — COMPREHENSIVE METABOLIC PANEL (CC13)
ALT: 6 U/L (ref 0–55)
ANION GAP: 11 meq/L (ref 3–11)
AST: 14 U/L (ref 5–34)
Albumin: 3.4 g/dL — ABNORMAL LOW (ref 3.5–5.0)
Alkaline Phosphatase: 83 U/L (ref 40–150)
BUN: 8.2 mg/dL (ref 7.0–26.0)
CHLORIDE: 107 meq/L (ref 98–109)
CO2: 24 meq/L (ref 22–29)
CREATININE: 1 mg/dL (ref 0.6–1.1)
Calcium: 9.9 mg/dL (ref 8.4–10.4)
Glucose: 114 mg/dl (ref 70–140)
Potassium: 4.1 mEq/L (ref 3.5–5.1)
Sodium: 142 mEq/L (ref 136–145)
Total Bilirubin: 0.32 mg/dL (ref 0.20–1.20)
Total Protein: 7.1 g/dL (ref 6.4–8.3)

## 2013-12-06 NOTE — Progress Notes (Signed)
[place] Telephone:(336) [phone]   Fax:(336) [phone]  OFFICE PROGRESS NOTE  [name], MD [place]. [name] [address]  Diagnosis:  Metastatic non-small cell lung cancer initially diagnosed with locally advanced stage IIIB with right Pancoast tumor involving the vertebral body as well as the foraminal canal invasion with spinal cord compression in October of 2007.   Prior Therapy:  1. Status post concurrent chemoradiation with weekly carboplatin and paclitaxel, last dose was given November 18, 2005. 2. 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 3. Status post gamma knife radiotherapy to a solitary brain lesion located in the superior frontal area of the brain at [facility] in April of 2008. 4. Status post palliative radiotherapy to the lateral abdominal wall metastatic lesion under the care of Dr. Lisbeth Renshaw, completed March of 2009. 5. Status post 6 cycles of systemic chemotherapy with carboplatin and Alimta. Last dose was given July 26, 2007 with disease stabilization. 6. Gamma knife stereotactic radiotherapy to 2 brain lesions one involving the right frontal dural based as well as right parietal lesion performed on 05/07/2012 under the care of Dr. Vallarie Mare at [facility]  Current Therapy:  Maintenance chemotherapy with Alimta 500 mg per meter squared given every 3 weeks. The patient is status post 108 cycles.   CHEMOTHERAPY INTENT: Palliative/maintenance  CURRENT # OF CHEMOTHERAPY CYCLES: 109 CURRENT ANTIEMETICS: Zofran, dexamethasone and Compazine  CURRENT SMOKING STATUS: Quit smoking 10/07/2005  ORAL CHEMOTHERAPY AND CONSENT: None  CURRENT BISPHOSPHONATES USE: None  PAIN MANAGEMENT: 0/10 currently on morphine  NARCOTICS INDUCED CONSTIPATION: No constipation  LIVING WILL AND CODE STATUS: No CODE BLUE   INTERVAL HISTORY: Danielle [name] 63 y.o. female returns to the clinic today for followup visit accompanied by her sister. The patient is feeling fine today with no specific complaints except for occasional nausea that usually resolves with Compazine. She is tolerating her maintenance treatment with single agent Alimta fairly well with no significant adverse effects except for the occasional nausea. The patient denied having any chest pain, shortness of breath, cough or hemoptysis. She denied having any fever or chills, no nausea or vomiting. The patient denied having any weight loss or night sweats. She is here today for cycle #109.   MEDICAL HISTORY: Past Medical History  Diagnosis Date  . Hypertension   . Anemia   . H/O: pneumonia   . History of tobacco abuse quit 10/08    on nicotine patch  . Fibromyalgia   . Hypokalemia   . DJD (degenerative joint disease), cervical   . Thrush 12/11/2010  . 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 had chemo and radiation for lung ca    ALLERGIES:  is allergic to contrast media; co-trimoxazole injection; iohexol; and sulfa drugs cross reactors.  MEDICATIONS:  Current Outpatient Prescriptions  Medication Sig Dispense Refill  . ALPRAZolam (XANAX) 0.25 MG tablet Take 1 tablet (0.25 mg total) by mouth at bedtime as needed for sleep. 30 tablet 0  . amLODipine (NORVASC) 10 MG tablet Take 1 tablet (10 mg total) by mouth daily. 90 tablet 3  . antipyrine-benzocaine (AURALGAN) otic solution Place 1 drop into both ears daily. 10 mL 1  . fluticasone (FLONASE) 50 MCG/ACT nasal spray USE 1-2 SPRAYS IN EACH NOSTRIL DAILY 16 g 11  . folic acid (FOLVITE) 1 MG tablet TAKE 1  TABLET BY MOUTH DAILY. 90 tablet 1  . lidocaine-prilocaine (EMLA) cream Apply topically as needed. 30 g 1  . lisinopril (PRINIVIL,ZESTRIL) 20 MG tablet TAKE 2 TABLETS (40 MG TOTAL) BY MOUTH DAILY. 60 tablet 5  . morphine (MS CONTIN) 60 MG 12 hr tablet Take 1 tablet (60 mg total) by mouth 2 (two)  times daily. 60 tablet 0  . morphine (MSIR) 30 MG tablet Take 1 tablet (30 mg total) by mouth every 4 (four) hours as needed. 60 tablet 0  . olopatadine (PATANOL) 0.1 % ophthalmic solution Place 1 drop into both eyes 2 (two) times daily. 5 mL 11  . ondansetron (ZOFRAN) 8 MG tablet TAKE 1 TABLET BY MOUTH EVERY 8 HOURS AS NEEDED FOR NAUSEA. 30 tablet 2  . predniSONE (DELTASONE) 50 MG tablet 13 hrs, 7 hrs, and 1 hr before scheduled CT scans 9 tablet 1  . simvastatin (ZOCOR) 20 MG tablet Take 1 tablet (20 mg total) by mouth daily. 90 tablet 3   No current facility-administered medications for this visit.    SURGICAL HISTORY:  Past Surgical History  Procedure Laterality Date  . Cervical laminectomy  1995  . Other surgical history  2008    Gamma knife surgery to remove brain met   . 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 Knee surgery  1990    Left x 2  . Portacath placement  -[name]    TIP IN CAVOATRIAL JUNCTION    REVIEW OF SYSTEMS:  Constitutional: negative Eyes: negative Ears, nose, mouth, throat, and face: negative Respiratory: negative Cardiovascular: negative Gastrointestinal: negative Genitourinary:negative Integument/breast: negative Hematologic/lymphatic: negative Musculoskeletal:negative Neurological: negative Behavioral/Psych: negative Endocrine: negative Allergic/Immunologic: negative   PHYSICAL EXAMINATION: General appearance: alert, cooperative and no distress Head: Normocephalic, without obvious abnormality, atraumatic Neck: no adenopathy, no JVD, supple, symmetrical, trachea midline and thyroid not enlarged, symmetric, no tenderness/mass/nodules Lymph nodes: Cervical, supraclavicular, and axillary nodes normal. Resp: clear to auscultation bilaterally Back: symmetric, no curvature. ROM normal. No CVA tenderness. Cardio: regular rate and rhythm, S1, S2 normal, no murmur, click, rub or gallop GI: soft, non-tender; bowel sounds  normal; no masses,  no organomegaly Extremities: extremities normal, atraumatic, no cyanosis or edema Neurologic: Alert and oriented X 3, normal strength and tone. Normal symmetric reflexes. Normal coordination and gait  ECOG PERFORMANCE STATUS: 0 - Asymptomatic  Blood pressure 102/56, pulse 82, temperature 98 F (36.7 C), temperature source Oral, resp. rate 18, height '5\' 3"'  (1.6 m), weight 156 lb 11.2 oz (71.079 kg).  LABORATORY DATA: Lab Results  Component Value Date   WBC 7.4 12/06/2013   HGB 11.8 12/06/2013   HCT 36.9 12/06/2013   MCV 93.7 12/06/2013   PLT 423* 12/06/2013      Chemistry      Component Value Date/Time   NA 143 11/16/2013 0922   NA 138 02/05/2013 1026   K 4.0 11/16/2013 0922   K 4.3 02/05/2013 1026   CL 105 02/05/2013 1026   CL 106 06/30/2012 1004   CO2 27 11/16/2013 0922   CO2 27 02/05/2013 1026   BUN 5.9* 11/16/2013 0922   BUN 9 02/05/2013 1026   CREATININE 0.8 11/16/2013 0922   CREATININE 0.6 02/05/2013 1026      Component Value Date/Time   CALCIUM 9.8 11/16/2013 0922   CALCIUM 9.2 02/05/2013 1026   ALKPHOS 75 11/16/2013 0922   ALKPHOS 75 02/05/2013 1026   AST 15 11/16/2013 0922   AST  17 02/05/2013 1026   ALT 9 11/16/2013 0922   ALT 9 02/05/2013 1026   BILITOT 0.26 11/16/2013 0922   BILITOT 0.4 02/05/2013 1026       RADIOGRAPHIC STUDIES:  ASSESSMENT AND PLAN: This is a very pleasant 63 years old white female with metastatic non-small cell lung cancer currently undergoing maintenance chemotherapy with single agent Alimta status post 108 cycles. The patient is tolerating her treatment fairly well with no significant adverse effects.  I recommended for her to continue her current treatment with maintenance Alimta as scheduled. She will receive cycle #109 today. For pain management, she will continue on her current pain medication with MS Contin and MS IR. She was given a refill for her pain medications. The patient would come back for  followup visit in 3 weeks for evaluation before starting cycle #110. She was advised to call immediately if she has any concerning symptoms in the interval. The patient voices understanding of current disease status and treatment options and is in agreement with the current care plan.  All questions were answered. The patient knows to call the clinic with any problems, questions or concerns. We can certainly see the patient much sooner if necessary.  Disclaimer: This note was dictated with voice recognition software. Similar sounding words can inadvertently be transcribed and may not be corrected upon review.

## 2013-12-06 NOTE — Telephone Encounter (Signed)
Gave avs & cal forDec. Sent mess to sch tx. °

## 2013-12-06 NOTE — Telephone Encounter (Signed)
Per staff message and POF I have scheduled appts. Advised scheduler of appts. JMW  

## 2013-12-07 ENCOUNTER — Ambulatory Visit (HOSPITAL_BASED_OUTPATIENT_CLINIC_OR_DEPARTMENT_OTHER): Payer: Medicare Other

## 2013-12-07 ENCOUNTER — Other Ambulatory Visit: Payer: Medicare Other

## 2013-12-07 DIAGNOSIS — Z5111 Encounter for antineoplastic chemotherapy: Secondary | ICD-10-CM | POA: Diagnosis not present

## 2013-12-07 DIAGNOSIS — C7931 Secondary malignant neoplasm of brain: Secondary | ICD-10-CM | POA: Diagnosis not present

## 2013-12-07 DIAGNOSIS — C3411 Malignant neoplasm of upper lobe, right bronchus or lung: Secondary | ICD-10-CM | POA: Diagnosis not present

## 2013-12-07 DIAGNOSIS — C7951 Secondary malignant neoplasm of bone: Secondary | ICD-10-CM

## 2013-12-07 MED ORDER — ONDANSETRON 8 MG/NS 50 ML IVPB
INTRAVENOUS | Status: AC
  Filled 2013-12-07: qty 8

## 2013-12-07 MED ORDER — HEPARIN SOD (PORK) LOCK FLUSH 100 UNIT/ML IV SOLN
500.0000 [IU] | Freq: Once | INTRAVENOUS | Status: AC | PRN
Start: 2013-12-07 — End: 2013-12-07
  Administered 2013-12-07: 500 [IU]
  Filled 2013-12-07: qty 5

## 2013-12-07 MED ORDER — ONDANSETRON 8 MG/50ML IVPB (CHCC)
8.0000 mg | Freq: Once | INTRAVENOUS | Status: AC
  Administered 2013-12-07: 8 mg via INTRAVENOUS

## 2013-12-07 MED ORDER — DEXAMETHASONE SODIUM PHOSPHATE 10 MG/ML IJ SOLN
10.0000 mg | Freq: Once | INTRAMUSCULAR | Status: AC
  Administered 2013-12-07: 10 mg via INTRAVENOUS

## 2013-12-07 MED ORDER — SODIUM CHLORIDE 0.9 % IV SOLN
Freq: Once | INTRAVENOUS | Status: AC
  Administered 2013-12-07: 10:00:00 via INTRAVENOUS

## 2013-12-07 MED ORDER — SODIUM CHLORIDE 0.9 % IJ SOLN
10.0000 mL | INTRAMUSCULAR | Status: DC | PRN
  Administered 2013-12-07: 10 mL
  Filled 2013-12-07: qty 10

## 2013-12-07 MED ORDER — DEXAMETHASONE SODIUM PHOSPHATE 10 MG/ML IJ SOLN
INTRAMUSCULAR | Status: AC
  Filled 2013-12-07: qty 1

## 2013-12-07 MED ORDER — SODIUM CHLORIDE 0.9 % IV SOLN
500.0000 mg/m2 | Freq: Once | INTRAVENOUS | Status: AC
  Administered 2013-12-07: 900 mg via INTRAVENOUS
  Filled 2013-12-07: qty 36

## 2013-12-07 NOTE — Patient Instructions (Signed)
[facility] Discharge Instructions for Patients Receiving Chemotherapy  Today you received the following chemotherapy agents: Alimta.  To help prevent nausea and vomiting after your treatment, we encourage you to take your nausea medication as prescribed.   If you develop nausea and vomiting that is not controlled by your nausea medication, call the clinic.   BELOW ARE SYMPTOMS THAT SHOULD BE REPORTED IMMEDIATELY:  *FEVER GREATER THAN 100.5 F  *CHILLS WITH OR WITHOUT FEVER  NAUSEA AND VOMITING THAT IS NOT CONTROLLED WITH YOUR NAUSEA MEDICATION  *UNUSUAL SHORTNESS OF BREATH  *UNUSUAL BRUISING OR BLEEDING  TENDERNESS IN MOUTH AND THROAT WITH OR WITHOUT PRESENCE OF ULCERS  *URINARY PROBLEMS  *BOWEL PROBLEMS  UNUSUAL RASH Items with * indicate a potential emergency and should be followed up as soon as possible.  Feel free to call the clinic you have any questions or concerns. The clinic phone number is [phone].

## 2013-12-14 ENCOUNTER — Other Ambulatory Visit: Payer: Self-pay | Admitting: Medical Oncology

## 2013-12-14 DIAGNOSIS — C3410 Malignant neoplasm of upper lobe, unspecified bronchus or lung: Secondary | ICD-10-CM

## 2013-12-14 MED ORDER — ALPRAZOLAM 0.25 MG PO TABS: 0.2500 mg | ORAL_TABLET | Freq: Every evening | ORAL | Status: DC | PRN

## 2013-12-14 MED ORDER — MORPHINE SULFATE ER 60 MG PO TBCR: 60.0000 mg | EXTENDED_RELEASE_TABLET | Freq: Two times a day (BID) | ORAL | Status: DC

## 2013-12-14 MED ORDER — MORPHINE SULFATE 30 MG PO TABS: 30.0000 mg | ORAL_TABLET | ORAL | Status: DC | PRN

## 2013-12-21 ENCOUNTER — Other Ambulatory Visit: Payer: Medicare Other

## 2013-12-22 ENCOUNTER — Encounter: Payer: Self-pay | Admitting: *Deleted

## 2013-12-22 DIAGNOSIS — Z85118 Personal history of other malignant neoplasm of bronchus and lung: Secondary | ICD-10-CM

## 2013-12-22 DIAGNOSIS — C3411 Malignant neoplasm of upper lobe, right bronchus or lung: Secondary | ICD-10-CM

## 2013-12-24 ENCOUNTER — Encounter (HOSPITAL_COMMUNITY): Payer: Self-pay

## 2013-12-24 ENCOUNTER — Ambulatory Visit (HOSPITAL_COMMUNITY)
Admission: RE | Admit: 2013-12-24 | Discharge: 2013-12-24 | Disposition: A | Discharge status: Home / Self Care | Payer: Medicare Other | Source: Ambulatory Visit | Attending: Internal Medicine | Admitting: Internal Medicine

## 2013-12-24 DIAGNOSIS — C3481 Malignant neoplasm of overlapping sites of right bronchus and lung: Secondary | ICD-10-CM | POA: Diagnosis not present

## 2013-12-24 DIAGNOSIS — Z79899 Other long term (current) drug therapy: Secondary | ICD-10-CM | POA: Insufficient documentation

## 2013-12-24 DIAGNOSIS — C3411 Malignant neoplasm of upper lobe, right bronchus or lung: Secondary | ICD-10-CM | POA: Diagnosis not present

## 2013-12-24 DIAGNOSIS — Z923 Personal history of irradiation: Secondary | ICD-10-CM | POA: Diagnosis not present

## 2013-12-24 MED ORDER — IOHEXOL 300 MG/ML  SOLN
100.0000 mL | Freq: Once | INTRAMUSCULAR | Status: AC | PRN
  Administered 2013-12-24: 100 mL via INTRAVENOUS

## 2013-12-28 ENCOUNTER — Ambulatory Visit (HOSPITAL_BASED_OUTPATIENT_CLINIC_OR_DEPARTMENT_OTHER): Payer: Medicare Other | Admitting: Internal Medicine

## 2013-12-28 ENCOUNTER — Encounter: Payer: Self-pay | Admitting: Internal Medicine

## 2013-12-28 ENCOUNTER — Ambulatory Visit (HOSPITAL_BASED_OUTPATIENT_CLINIC_OR_DEPARTMENT_OTHER): Payer: Medicare Other | Admitting: Lab

## 2013-12-28 ENCOUNTER — Telehealth: Payer: Self-pay | Admitting: Internal Medicine

## 2013-12-28 ENCOUNTER — Ambulatory Visit (HOSPITAL_BASED_OUTPATIENT_CLINIC_OR_DEPARTMENT_OTHER): Payer: Medicare Other

## 2013-12-28 VITALS — BP 111/62 | HR 95 | Temp 98.6°F | Resp 19 | Ht 63.0 in | Wt 160.5 lb

## 2013-12-28 DIAGNOSIS — C3411 Malignant neoplasm of upper lobe, right bronchus or lung: Secondary | ICD-10-CM

## 2013-12-28 DIAGNOSIS — G893 Neoplasm related pain (acute) (chronic): Secondary | ICD-10-CM

## 2013-12-28 DIAGNOSIS — Z5111 Encounter for antineoplastic chemotherapy: Secondary | ICD-10-CM | POA: Diagnosis not present

## 2013-12-28 LAB — COMPREHENSIVE METABOLIC PANEL (CC13)
ALK PHOS: 81 U/L (ref 40–150)
ALT: 9 U/L (ref 0–55)
AST: 12 U/L (ref 5–34)
Albumin: 3.4 g/dL — ABNORMAL LOW (ref 3.5–5.0)
Anion Gap: 9 mEq/L (ref 3–11)
BUN: 6 mg/dL — AB (ref 7.0–26.0)
CO2: 25 mEq/L (ref 22–29)
Calcium: 9.4 mg/dL (ref 8.4–10.4)
Chloride: 106 mEq/L (ref 98–109)
Creatinine: 0.8 mg/dL (ref 0.6–1.1)
EGFR: 86 mL/min/{1.73_m2} — AB (ref >90)
GLUCOSE: 116 mg/dL (ref 70–140)
Potassium: 3.8 mEq/L (ref 3.5–5.1)
Sodium: 140 mEq/L (ref 136–145)
TOTAL PROTEIN: 6.5 g/dL (ref 6.4–8.3)
Total Bilirubin: 0.31 mg/dL (ref 0.20–1.20)

## 2013-12-28 LAB — CBC WITH DIFFERENTIAL/PLATELET
BASO%: 0.6 % (ref 0.0–2.0)
BASOS ABS: 0.1 10*3/uL (ref 0.0–0.1)
EOS%: 0.3 % (ref 0.0–7.0)
Eosinophils Absolute: 0 10*3/uL (ref 0.0–0.5)
HEMATOCRIT: 36.1 % (ref 34.8–46.6)
HGB: 11.9 g/dL (ref 11.6–15.9)
LYMPH#: 1.5 10*3/uL (ref 0.9–3.3)
LYMPH%: 17.8 % (ref 14.0–49.7)
MCH: 30.2 pg (ref 25.1–34.0)
MCHC: 32.9 g/dL (ref 31.5–36.0)
MCV: 91.9 fL (ref 79.5–101.0)
MONO#: 1.2 10*3/uL — AB (ref 0.1–0.9)
MONO%: 14.7 % — ABNORMAL HIGH (ref 0.0–14.0)
NEUT#: 5.4 10*3/uL (ref 1.5–6.5)
NEUT%: 66.6 % (ref 38.4–76.8)
Platelets: 361 10*3/uL (ref 145–400)
RBC: 3.93 10*6/uL (ref 3.70–5.45)
RDW: 14.7 % — ABNORMAL HIGH (ref 11.2–14.5)
WBC: 8.2 10*3/uL (ref 3.9–10.3)

## 2013-12-28 LAB — RESEARCH LABS

## 2013-12-28 MED ORDER — SODIUM CHLORIDE 0.9 % IV SOLN
500.0000 mg/m2 | Freq: Once | INTRAVENOUS | Status: AC
  Administered 2013-12-28: 900 mg via INTRAVENOUS
  Filled 2013-12-28: qty 36

## 2013-12-28 MED ORDER — SODIUM CHLORIDE 0.9 % IV SOLN
Freq: Once | INTRAVENOUS | Status: AC
  Administered 2013-12-28: 11:00:00 via INTRAVENOUS

## 2013-12-28 MED ORDER — SODIUM CHLORIDE 0.9 % IJ SOLN
10.0000 mL | INTRAMUSCULAR | Status: DC | PRN
Start: 2013-12-28 — End: 2013-12-28
  Administered 2013-12-28: 10 mL
  Filled 2013-12-28: qty 10

## 2013-12-28 MED ORDER — HEPARIN SOD (PORK) LOCK FLUSH 100 UNIT/ML IV SOLN
500.0000 [IU] | Freq: Once | INTRAVENOUS | Status: AC | PRN
  Administered 2013-12-28: 500 [IU]
  Filled 2013-12-28: qty 5

## 2013-12-28 MED ORDER — DEXAMETHASONE SODIUM PHOSPHATE 10 MG/ML IJ SOLN
10.0000 mg | Freq: Once | INTRAMUSCULAR | Status: AC
  Administered 2013-12-28: 10 mg via INTRAVENOUS

## 2013-12-28 MED ORDER — ONDANSETRON 8 MG/50ML IVPB (CHCC)
8.0000 mg | Freq: Once | INTRAVENOUS | Status: AC
  Administered 2013-12-28: 8 mg via INTRAVENOUS

## 2013-12-28 MED ORDER — ONDANSETRON 8 MG/NS 50 ML IVPB
INTRAVENOUS | Status: AC
  Filled 2013-12-28: qty 8

## 2013-12-28 MED ORDER — DEXAMETHASONE SODIUM PHOSPHATE 10 MG/ML IJ SOLN
INTRAMUSCULAR | Status: AC
Start: 2013-12-28 — End: 2013-12-28
  Filled 2013-12-28: qty 1

## 2013-12-28 MED ORDER — ALTEPLASE 2 MG IJ SOLR
2.0000 mg | Freq: Once | INTRAMUSCULAR | Status: AC | PRN
  Administered 2013-12-28: 2 mg
  Filled 2013-12-28: qty 2

## 2013-12-28 NOTE — Progress Notes (Signed)
[place] Telephone:([phone]) [phone]   Fax:(336) [phone]  OFFICE PROGRESS NOTE  [name], MD [place] [address]  Diagnosis:  Metastatic non-small cell lung cancer initially diagnosed with locally advanced stage IIIB with right Pancoast tumor involving the vertebral body as well as the foraminal canal invasion with spinal cord compression in October of 2007.   Prior Therapy:  1. Status post concurrent chemoradiation with weekly carboplatin and paclitaxel, last dose was given November 18, 2005. 2. 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 3. Status post gamma knife radiotherapy to a solitary brain lesion located in the superior frontal area of the brain at [facility] in April of 2008. 4. Status post palliative radiotherapy to the lateral abdominal wall metastatic lesion under the care of Dr. Lisbeth Renshaw, completed March of 2009. 5. Status post 6 cycles of systemic chemotherapy with carboplatin and Alimta. Last dose was given July 26, 2007 with disease stabilization. 6. Gamma knife stereotactic radiotherapy to 2 brain lesions one involving the right frontal dural based as well as right parietal lesion performed on 05/07/2012 under the care of Dr. Vallarie Mare at [facility].  Current Therapy:  Maintenance chemotherapy with Alimta 500 mg per meter squared given every 3 weeks. The patient is status post 109 cycles.   CHEMOTHERAPY INTENT: Palliative/maintenance  CURRENT # OF CHEMOTHERAPY CYCLES: 110 CURRENT ANTIEMETICS: Zofran, dexamethasone and Compazine  CURRENT SMOKING STATUS: Quit smoking 10/07/2005  ORAL CHEMOTHERAPY AND CONSENT: None  CURRENT BISPHOSPHONATES USE: None  PAIN MANAGEMENT: 0/10 currently on morphine  NARCOTICS INDUCED CONSTIPATION: No constipation  LIVING WILL AND CODE STATUS: No CODE BLUE   INTERVAL HISTORY: LINA HITCH 63 y.o. female  returns to the clinic today for followup visit accompanied by her sister. The patient is feeling fine today with no specific complaints except for chest congestion recently but no significant fever or chills. She is tolerating her maintenance treatment with single agent Alimta fairly well with no significant adverse effects except for the occasional nausea. The patient denied having any chest pain, shortness of breath, cough or hemoptysis. No nausea or vomiting. The patient denied having any weight loss or night sweats. She had repeat CT scan of the chest, abdomen and pelvis performed recently and she is here for evaluation and discussion of her scan results before starting cycle #110 of her chemotherapy.   MEDICAL HISTORY: Past Medical History  Diagnosis Date  . Hypertension   . Anemia   . H/O: pneumonia   . History of tobacco abuse quit 10/08    on nicotine patch  . Fibromyalgia   . Hypokalemia   . DJD (degenerative joint disease), cervical   . Thrush 12/11/2010  . 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 had chemo and radiation for lung ca    ALLERGIES:  is allergic to contrast media; co-trimoxazole injection; iohexol; and sulfa drugs cross reactors.  MEDICATIONS:  Current Outpatient Prescriptions  Medication Sig Dispense Refill  . ALPRAZolam (XANAX) 0.25 MG tablet Take 1 tablet (0.25 mg total) by mouth at bedtime as needed for sleep. 30 tablet 0  . amLODipine (NORVASC) 10 MG tablet Take 1 tablet (10 mg total) by mouth daily. 90 tablet 3  . antipyrine-benzocaine (AURALGAN) otic solution Place 1 drop into both ears daily. 10 mL 1  . fluticasone (FLONASE) 50 MCG/ACT nasal spray USE 1-2 SPRAYS IN [facility]  NOSTRIL DAILY 16 g 11  . folic acid (FOLVITE) 1 MG tablet TAKE 1 TABLET BY MOUTH DAILY. 90 tablet 1  . lidocaine-prilocaine (EMLA) cream Apply topically as needed. 30 g 1  . lisinopril (PRINIVIL,ZESTRIL) 20 MG tablet TAKE 2 TABLETS (40 MG TOTAL) BY MOUTH DAILY.  60 tablet 5  . morphine (MS CONTIN) 60 MG 12 hr tablet Take 1 tablet (60 mg total) by mouth 2 (two) times daily. 60 tablet 0  . morphine (MSIR) 30 MG tablet Take 1 tablet (30 mg total) by mouth every 4 (four) hours as needed. 60 tablet 0  . olopatadine (PATANOL) 0.1 % ophthalmic solution Place 1 drop into both eyes 2 (two) times daily. 5 mL 11  . ondansetron (ZOFRAN) 8 MG tablet TAKE 1 TABLET BY MOUTH EVERY 8 HOURS AS NEEDED FOR NAUSEA. 30 tablet 2  . predniSONE (DELTASONE) 50 MG tablet 13 hrs, 7 hrs, and 1 hr before scheduled CT scans 9 tablet 1  . simvastatin (ZOCOR) 20 MG tablet Take 1 tablet (20 mg total) by mouth daily. 90 tablet 3   No current facility-administered medications for this visit.    SURGICAL HISTORY:  Past Surgical History  Procedure Laterality Date  . Cervical laminectomy  1995  . Other surgical history  2008    Gamma knife surgery to remove brain met   . 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 Knee surgery  1990    Left x 2  . Portacath placement  -[name]    TIP IN CAVOATRIAL JUNCTION    REVIEW OF SYSTEMS:  Constitutional: negative Eyes: negative Ears, nose, mouth, throat, and face: negative Respiratory: negative Cardiovascular: negative Gastrointestinal: negative Genitourinary:negative Integument/breast: negative Hematologic/lymphatic: negative Musculoskeletal:negative Neurological: negative Behavioral/Psych: negative Endocrine: negative Allergic/Immunologic: negative   PHYSICAL EXAMINATION: General appearance: alert, cooperative and no distress Head: Normocephalic, without obvious abnormality, atraumatic Neck: no adenopathy, no JVD, supple, symmetrical, trachea midline and thyroid not enlarged, symmetric, no tenderness/mass/nodules Lymph nodes: Cervical, supraclavicular, and axillary nodes normal. Resp: clear to auscultation bilaterally Back: symmetric, no curvature. ROM normal. No CVA tenderness. Cardio: regular rate  and rhythm, S1, S2 normal, no murmur, click, rub or gallop GI: soft, non-tender; bowel sounds normal; no masses,  no organomegaly Extremities: extremities normal, atraumatic, no cyanosis or edema Neurologic: Alert and oriented X 3, normal strength and tone. Normal symmetric reflexes. Normal coordination and gait  ECOG PERFORMANCE STATUS: 0 - Asymptomatic  Blood pressure 111/62, pulse 95, temperature 98.6 F (37 C), temperature source Oral, resp. rate 19, height _0  (1.6 m), weight 160 lb 8 oz (72.802 kg), SpO2 96 %.  LABORATORY DATA: Lab Results  Component Value Date   WBC 8.2 12/28/2013   HGB 11.9 12/28/2013   HCT 36.1 12/28/2013   MCV 91.9 12/28/2013   PLT 361 12/28/2013      Chemistry      Component Value Date/Time   NA 140 12/28/2013 0816   NA 138 02/05/2013 1026   K 3.8 12/28/2013 0816   K 4.3 02/05/2013 1026   CL 105 02/05/2013 1026   CL 106 06/30/2012 1004   CO2 25 12/28/2013 0816   CO2 27 02/05/2013 1026   BUN 6.0* 12/28/2013 0816   BUN 9 02/05/2013 1026   CREATININE 0.8 12/28/2013 0816   CREATININE 0.6 02/05/2013 1026      Component Value Date/Time   CALCIUM 9.4 12/28/2013 0816   CALCIUM 9.2 02/05/2013 1026   ALKPHOS  81 12/28/2013 0816   ALKPHOS 75 02/05/2013 1026   AST 12 12/28/2013 0816   AST 17 02/05/2013 1026   ALT 9 12/28/2013 0816   ALT 9 02/05/2013 1026   BILITOT 0.31 12/28/2013 0816   BILITOT 0.4 02/05/2013 1026       RADIOGRAPHIC STUDIES: Ct Chest W Contrast  12/24/2013   CLINICAL DATA:  Followup metastatic right lung carcinoma. Ongoing chemotherapy. Previous radiation therapy.  EXAM: CT CHEST, ABDOMEN, AND PELVIS WITH CONTRAST  TECHNIQUE: Multidetector CT imaging of the chest, abdomen and pelvis was performed following the standard protocol during bolus administration of intravenous contrast.  CONTRAST:  192m OMNIPAQUE IOHEXOL 300 MG/ML  SOLN  COMPARISON:  10/22/2013  FINDINGS: CT CHEST FINDINGS  Mediastinum/Hilar Regions: No masses or  pathologically enlarged lymph nodes identified.  Other Thoracic Lymphadenopathy:  None.  Lungs: Emphysema again noted. Pleural-based opacity in the anterior right upper lobe measures 11 mm on image 16 and is stable. Other areas of scarring in both lung apices also appear stable. No new or enlarging pulmonary nodules or masses are identified.  Pleura:  No evidence of effusion or mass.  Vascular/Cardiac:  No acute findings identified.  Other:  None.  Musculoskeletal:  No suspicious bone lesions identified.  CT ABDOMEN AND PELVIS FINDINGS  Hepatobiliary: No masses or other significant abnormality identified.  Pancreas: No mass, inflammatory changes, or other parenchymal abnormality identified.  Spleen:  Within normal limits in size and appearance.  Adrenal Glands:  No mass identified.  Kidneys/Urinary Tract: No masses identified. No evidence of hydronephrosis.  Stomach/Bowel/Peritoneum: No evidence of wall thickening, mass, or obstruction.  Vascular/Lymphatic: No pathologically enlarged lymph nodes identified. No other significant abnormality identified.  Reproductive:  No mass or other significant abnormality identified.  Other:  None.  Musculoskeletal:  No suspicious bone lesions identified.  IMPRESSION: Stable exam. No evidence of recurrent or metastatic carcinoma within the chest, abdomen, or pelvis.   Electronically Signed   By: JEarle GellM.D.   On: 12/24/2013 08:42   Ct Abdomen Pelvis W Contrast  12/24/2013   CLINICAL DATA:  Followup metastatic right lung carcinoma. Ongoing chemotherapy. Previous radiation therapy.  EXAM: CT CHEST, ABDOMEN, AND PELVIS WITH CONTRAST  TECHNIQUE: Multidetector CT imaging of the chest, abdomen and pelvis was performed following the standard protocol during bolus administration of intravenous contrast.  CONTRAST:  1054mOMNIPAQUE IOHEXOL 300 MG/ML  SOLN  COMPARISON:  10/22/2013  FINDINGS: CT CHEST FINDINGS  Mediastinum/Hilar Regions: No masses or pathologically enlarged lymph  nodes identified.  Other Thoracic Lymphadenopathy:  None.  Lungs: Emphysema again noted. Pleural-based opacity in the anterior right upper lobe measures 11 mm on image 16 and is stable. Other areas of scarring in both lung apices also appear stable. No new or enlarging pulmonary nodules or masses are identified.  Pleura:  No evidence of effusion or mass.  Vascular/Cardiac:  No acute findings identified.  Other:  None.  Musculoskeletal:  No suspicious bone lesions identified.  CT ABDOMEN AND PELVIS FINDINGS  Hepatobiliary: No masses or other significant abnormality identified.  Pancreas: No mass, inflammatory changes, or other parenchymal abnormality identified.  Spleen:  Within normal limits in size and appearance.  Adrenal Glands:  No mass identified.  Kidneys/Urinary Tract: No masses identified. No evidence of hydronephrosis.  Stomach/Bowel/Peritoneum: No evidence of wall thickening, mass, or obstruction.  Vascular/Lymphatic: No pathologically enlarged lymph nodes identified. No other significant abnormality identified.  Reproductive:  No mass or other significant abnormality identified.  Other:  None.  Musculoskeletal:  No suspicious bone lesions identified.  IMPRESSION: Stable exam. No evidence of recurrent or metastatic carcinoma within the chest, abdomen, or pelvis.   Electronically Signed   By: Earle Gell M.D.   On: 12/24/2013 08:42   ASSESSMENT AND PLAN: This is a very pleasant 63 years old white female with metastatic non-small cell lung cancer currently undergoing maintenance chemotherapy with single agent Alimta status post 109 cycles. The patient is tolerating her treatment fairly well with no significant adverse effects.  Her recent CT scan of the chest, abdomen and pelvis showed stable disease with no evidence of recurrent or metastatic carcinoma within the chest, abdomen or pelvis. I discussed the scan results with the patient and her sister. I recommended for her to continue her current  treatment with maintenance Alimta as scheduled. She will receive cycle #110 today. For pain management, she will continue on her current pain medication with MS Contin and MS IR.  The patient would come back for followup visit in 3 weeks for evaluation before starting cycle #111. She was advised to call immediately if she has any concerning symptoms in the interval. The patient voices understanding of current disease status and treatment options and is in agreement with the current care plan.  All questions were answered. The patient knows to call the clinic with any problems, questions or concerns. We can certainly see the patient much sooner if necessary.  Disclaimer: This note was dictated with voice recognition software. Similar sounding words can inadvertently be transcribed and may not be corrected upon review.

## 2013-12-28 NOTE — Patient Instructions (Signed)
[facility] Discharge Instructions for Patients Receiving Chemotherapy  Today you received the following chemotherapy agents:   alimta  To help prevent nausea and vomiting after your treatment, we encourage you to take your nausea medication.  Take it as often as prescribed.     If you develop nausea and vomiting that is not controlled by your nausea medication, call the clinic. If it is after clinic hours your family physician or the after hours number for the clinic or go to the Emergency Department.   BELOW ARE SYMPTOMS THAT SHOULD BE REPORTED IMMEDIATELY:  *FEVER GREATER THAN 100.5 F  *CHILLS WITH OR WITHOUT FEVER  NAUSEA AND VOMITING THAT IS NOT CONTROLLED WITH YOUR NAUSEA MEDICATION  *UNUSUAL SHORTNESS OF BREATH  *UNUSUAL BRUISING OR BLEEDING  TENDERNESS IN MOUTH AND THROAT WITH OR WITHOUT PRESENCE OF ULCERS  *URINARY PROBLEMS  *BOWEL PROBLEMS  UNUSUAL RASH Items with * indicate a potential emergency and should be followed up as soon as possible.  Feel free to call the clinic you have any questions or concerns. The clinic phone number is [phone].   I have been informed and understand all the instructions given to me. I know to contact the clinic, my physician, or go to the Emergency Department if any problems should occur. I do not have any questions at this time, but understand that I may call the clinic during office hours   should I have any questions or need assistance in obtaining follow up care.    __________________________________________  _____________  __________ Signature of Patient or Authorized Representative            Date                   Time    __________________________________________ Nurse's Signature

## 2013-12-28 NOTE — Progress Notes (Unsigned)
12/28/13 @ 8:30 am, BMS [id number], Research Labs and PROs:  Danielle [name], accompanied by her sister, into the [facility] for labs, to see Dr. Julien Nordmann and to receive Alimta.  She had research labs drawn from her arm (which is her preference for all labs) and completed the LCSS and EQ-[id number] prior to her visit.  She had restaging scans earlier this week and continues to have stable disease.  Her next study visit will be on about 02/08/14.

## 2013-12-28 NOTE — Telephone Encounter (Signed)
gv and printed appt sched and avs for pt for Jan and Feb 2016 °

## 2013-12-29 ENCOUNTER — Other Ambulatory Visit: Payer: Self-pay | Admitting: Family Medicine

## 2014-01-12 DIAGNOSIS — Z923 Personal history of irradiation: Secondary | ICD-10-CM | POA: Insufficient documentation

## 2014-01-12 DIAGNOSIS — Z0389 Encounter for observation for other suspected diseases and conditions ruled out: Secondary | ICD-10-CM | POA: Diagnosis not present

## 2014-01-12 DIAGNOSIS — C7931 Secondary malignant neoplasm of brain: Secondary | ICD-10-CM | POA: Diagnosis not present

## 2014-01-18 ENCOUNTER — Encounter: Payer: Self-pay | Admitting: Physician Assistant

## 2014-01-18 ENCOUNTER — Other Ambulatory Visit: Payer: Self-pay

## 2014-01-18 ENCOUNTER — Telehealth: Payer: Self-pay | Admitting: Physician Assistant

## 2014-01-18 ENCOUNTER — Other Ambulatory Visit (HOSPITAL_BASED_OUTPATIENT_CLINIC_OR_DEPARTMENT_OTHER): Payer: Medicare Other

## 2014-01-18 ENCOUNTER — Ambulatory Visit (HOSPITAL_BASED_OUTPATIENT_CLINIC_OR_DEPARTMENT_OTHER): Payer: Medicare Other | Admitting: Physician Assistant

## 2014-01-18 ENCOUNTER — Ambulatory Visit (HOSPITAL_BASED_OUTPATIENT_CLINIC_OR_DEPARTMENT_OTHER): Payer: Medicare Other

## 2014-01-18 VITALS — BP 155/67 | HR 80 | Temp 98.1°F | Resp 18 | Ht 63.0 in | Wt 161.3 lb

## 2014-01-18 DIAGNOSIS — C3411 Malignant neoplasm of upper lobe, right bronchus or lung: Secondary | ICD-10-CM | POA: Diagnosis not present

## 2014-01-18 DIAGNOSIS — Z5111 Encounter for antineoplastic chemotherapy: Secondary | ICD-10-CM | POA: Diagnosis not present

## 2014-01-18 DIAGNOSIS — C3410 Malignant neoplasm of upper lobe, unspecified bronchus or lung: Secondary | ICD-10-CM

## 2014-01-18 LAB — CBC WITH DIFFERENTIAL/PLATELET
BASO%: 0.3 % (ref 0.0–2.0)
Basophils Absolute: 0 10*3/uL (ref 0.0–0.1)
EOS%: 0.7 % (ref 0.0–7.0)
Eosinophils Absolute: 0.1 10*3/uL (ref 0.0–0.5)
HCT: 37.3 % (ref 34.8–46.6)
HGB: 12.1 g/dL (ref 11.6–15.9)
LYMPH%: 26.4 % (ref 14.0–49.7)
MCH: 30 pg (ref 25.1–34.0)
MCHC: 32.4 g/dL (ref 31.5–36.0)
MCV: 92.6 fL (ref 79.5–101.0)
MONO#: 1.1 10*3/uL — AB (ref 0.1–0.9)
MONO%: 15.3 % — ABNORMAL HIGH (ref 0.0–14.0)
NEUT#: 4.1 10*3/uL (ref 1.5–6.5)
NEUT%: 57.3 % (ref 38.4–76.8)
PLATELETS: 394 10*3/uL (ref 145–400)
RBC: 4.03 10*6/uL (ref 3.70–5.45)
RDW: 13.9 % (ref 11.2–14.5)
WBC: 7.2 10*3/uL (ref 3.9–10.3)
lymph#: 1.9 10*3/uL (ref 0.9–3.3)

## 2014-01-18 LAB — COMPREHENSIVE METABOLIC PANEL (CC13)
ALBUMIN: 3.3 g/dL — AB (ref 3.5–5.0)
ALT: 7 U/L (ref 0–55)
AST: 14 U/L (ref 5–34)
Alkaline Phosphatase: 91 U/L (ref 40–150)
Anion Gap: 6 mEq/L (ref 3–11)
BILIRUBIN TOTAL: 0.21 mg/dL (ref 0.20–1.20)
BUN: 5.2 mg/dL — ABNORMAL LOW (ref 7.0–26.0)
CO2: 30 mEq/L — ABNORMAL HIGH (ref 22–29)
Calcium: 9.5 mg/dL (ref 8.4–10.4)
Chloride: 105 mEq/L (ref 98–109)
Creatinine: 0.8 mg/dL (ref 0.6–1.1)
EGFR: 79 mL/min/{1.73_m2} — AB (ref >90)
GLUCOSE: 111 mg/dL (ref 70–140)
POTASSIUM: 4.6 meq/L (ref 3.5–5.1)
SODIUM: 142 meq/L (ref 136–145)
TOTAL PROTEIN: 7 g/dL (ref 6.4–8.3)

## 2014-01-18 MED ORDER — SODIUM CHLORIDE 0.9 % IV SOLN
Freq: Once | INTRAVENOUS | Status: AC
  Administered 2014-01-18: 11:00:00 via INTRAVENOUS

## 2014-01-18 MED ORDER — MORPHINE SULFATE ER 60 MG PO TBCR: 60.0000 mg | EXTENDED_RELEASE_TABLET | Freq: Two times a day (BID) | ORAL | Status: DC

## 2014-01-18 MED ORDER — ONDANSETRON 8 MG/50ML IVPB (CHCC)
8.0000 mg | Freq: Once | INTRAVENOUS | Status: AC
  Administered 2014-01-18: 8 mg via INTRAVENOUS

## 2014-01-18 MED ORDER — ONDANSETRON 8 MG/NS 50 ML IVPB
INTRAVENOUS | Status: AC
  Filled 2014-01-18: qty 8

## 2014-01-18 MED ORDER — MORPHINE SULFATE 30 MG PO TABS: 30.0000 mg | ORAL_TABLET | ORAL | Status: DC | PRN

## 2014-01-18 MED ORDER — DEXAMETHASONE SODIUM PHOSPHATE 10 MG/ML IJ SOLN
INTRAMUSCULAR | Status: AC
Start: 2014-01-18 — End: 2014-01-18
  Filled 2014-01-18: qty 1

## 2014-01-18 MED ORDER — ALPRAZOLAM 0.25 MG PO TABS: 0.2500 mg | ORAL_TABLET | Freq: Every evening | ORAL | Status: DC | PRN

## 2014-01-18 MED ORDER — SODIUM CHLORIDE 0.9 % IJ SOLN
10.0000 mL | INTRAMUSCULAR | Status: DC | PRN
  Administered 2014-01-18: 10 mL
  Filled 2014-01-18: qty 10

## 2014-01-18 MED ORDER — HEPARIN SOD (PORK) LOCK FLUSH 100 UNIT/ML IV SOLN
500.0000 [IU] | Freq: Once | INTRAVENOUS | Status: AC | PRN
  Administered 2014-01-18: 500 [IU]
  Filled 2014-01-18: qty 5

## 2014-01-18 MED ORDER — CYANOCOBALAMIN 1000 MCG/ML IJ SOLN
INTRAMUSCULAR | Status: AC
  Filled 2014-01-18: qty 1

## 2014-01-18 MED ORDER — DEXAMETHASONE SODIUM PHOSPHATE 10 MG/ML IJ SOLN
10.0000 mg | Freq: Once | INTRAMUSCULAR | Status: AC
  Administered 2014-01-18: 10 mg via INTRAVENOUS

## 2014-01-18 MED ORDER — SODIUM CHLORIDE 0.9 % IV SOLN
500.0000 mg/m2 | Freq: Once | INTRAVENOUS | Status: AC
  Administered 2014-01-18: 900 mg via INTRAVENOUS
  Filled 2014-01-18: qty 36

## 2014-01-18 MED ORDER — CYANOCOBALAMIN 1000 MCG/ML IJ SOLN
1000.0000 ug | Freq: Once | INTRAMUSCULAR | Status: AC
  Administered 2014-01-18: 1000 ug via INTRAMUSCULAR

## 2014-01-18 NOTE — Patient Instructions (Signed)
Follow-up in 3 weeks

## 2014-01-18 NOTE — Progress Notes (Addendum)
[place] [phone]   [phone]  OFFICE PROGRESS NOTE  [name], MD [place] [address]  Diagnosis:  Metastatic non-small cell lung cancer initially diagnosed with locally advanced stage IIIB with right Pancoast tumor involving the vertebral body as well as the foraminal canal invasion with spinal cord compression in October of 2007.   Prior Therapy:  1. Status post concurrent chemoradiation with weekly carboplatin and paclitaxel, last dose was given November 18, 2005. 2. 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 3. Status post gamma knife radiotherapy to a solitary brain lesion located in the superior frontal area of the brain at [facility] in April of 2008. 4. Status post palliative radiotherapy to the lateral abdominal wall metastatic lesion under the care of Dr. Lisbeth Renshaw, completed March of 2009. 5. Status post 6 cycles of systemic chemotherapy with carboplatin and Alimta. Last dose was given July 26, 2007 with disease stabilization. 6. Gamma knife stereotactic radiotherapy to 2 brain lesions one involving the right frontal dural based as well as right parietal lesion performed on 05/07/2012 under the care of Dr. Vallarie Mare at [facility].  Current Therapy:  Maintenance chemotherapy with Alimta 500 mg per meter squared given every 3 weeks. The patient is status post 110 cycles.   CHEMOTHERAPY INTENT: Palliative/maintenance  CURRENT # OF CHEMOTHERAPY CYCLES: 111 CURRENT ANTIEMETICS: Zofran, dexamethasone and Compazine  CURRENT SMOKING STATUS: Quit smoking 10/07/2005  ORAL CHEMOTHERAPY AND CONSENT: None  CURRENT BISPHOSPHONATES USE: None  PAIN MANAGEMENT: 0/10 currently on morphine  NARCOTICS INDUCED CONSTIPATION: No constipation  LIVING WILL AND CODE STATUS: No CODE BLUE   INTERVAL HISTORY: Danielle [name] 64 y.o. female  returns to the clinic today for followup visit. She reports she was recently seen by Dr. Vallarie Mare at [facility] and was told that everything on her recent brain scan was looking good. She states that she was told there is no scar tissue and there is no evidence of any residual stroke evidence. The patient is feeling fine today with no specific complaints. She requests refills for her long-acting and short acting morphine as well as her Xanax tablets. She also requests a refill for her Emla cream.  She is tolerating her maintenance treatment with single agent Alimta fairly well with no significant adverse effects except for the occasional nausea. The patient denied having any chest pain, shortness of breath, cough or hemoptysis. No nausea or vomiting. The patient denied having any weight loss or night sweats. She presents to start cycle #111 of her maintenance chemotherapy.   MEDICAL HISTORY: Past Medical History  Diagnosis Date  . Hypertension   . Anemia   . H/O: pneumonia   . History of tobacco abuse quit 10/08    on nicotine patch  . Fibromyalgia   . Hypokalemia   . DJD (degenerative joint disease), cervical   . Thrush 12/11/2010  . 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 had chemo and radiation for lung ca    ALLERGIES:  is allergic to contrast media; co-trimoxazole injection; iohexol; and sulfa drugs cross reactors.  MEDICATIONS:  Current Outpatient Prescriptions  Medication Sig Dispense Refill  . amLODipine (NORVASC) 10 MG tablet Take 1 tablet (10 mg total) by mouth daily. 90 tablet 3  . antipyrine-benzocaine (AURALGAN) otic solution Place 1 drop into both ears daily. 10 mL 1  . fluticasone (  FLONASE) 50 MCG/ACT nasal spray USE 1-2 SPRAYS IN EACH NOSTRIL DAILY 16 g 11  . folic acid (FOLVITE) 1 MG tablet TAKE 1 TABLET BY MOUTH DAILY. 90 tablet 1  . lidocaine-prilocaine (EMLA) cream Apply topically as needed. 30 g 1  . lisinopril (PRINIVIL,ZESTRIL) 20 MG tablet  TAKE 2 TABLETS (40 MG TOTAL) BY MOUTH DAILY. 60 tablet 5  . olopatadine (PATANOL) 0.1 % ophthalmic solution Place 1 drop into both eyes 2 (two) times daily. 5 mL 11  . ondansetron (ZOFRAN) 8 MG tablet TAKE 1 TABLET BY MOUTH EVERY 8 HOURS AS NEEDED FOR NAUSEA. 30 tablet 2  . predniSONE (DELTASONE) 50 MG tablet 13 hrs, 7 hrs, and 1 hr before scheduled CT scans 9 tablet 1  . simvastatin (ZOCOR) 20 MG tablet Take 1 tablet (20 mg total) by mouth daily. 90 tablet 3  . ALPRAZolam (XANAX) 0.25 MG tablet Take 1 tablet (0.25 mg total) by mouth at bedtime as needed for sleep. 30 tablet 0  . morphine (MS CONTIN) 60 MG 12 hr tablet Take 1 tablet (60 mg total) by mouth 2 (two) times daily. 60 tablet 0  . morphine (MSIR) 30 MG tablet Take 1 tablet (30 mg total) by mouth every 4 (four) hours as needed. 60 tablet 0   No current facility-administered medications for this visit.   Facility-Administered Medications Ordered in Other Visits  Medication Dose Route Frequency Provider Last Rate Last Dose  . sodium chloride 0.9 % injection 10 mL  10 mL Intracatheter PRN Curt Bears, MD   10 mL at 01/18/14 1150    SURGICAL HISTORY:  Past Surgical History  Procedure Laterality Date  . Cervical laminectomy  1995  . Other surgical history  2008    Gamma knife surgery to remove brain met   . 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 Knee surgery  1990    Left x 2  . Portacath placement  -[name]    TIP IN CAVOATRIAL JUNCTION    REVIEW OF SYSTEMS:  Constitutional: negative Eyes: negative Ears, nose, mouth, throat, and face: negative Respiratory: negative Cardiovascular: negative Gastrointestinal: negative Genitourinary:negative Integument/breast: negative Hematologic/lymphatic: negative Musculoskeletal:negative Neurological: negative Behavioral/Psych: negative Endocrine: negative Allergic/Immunologic: negative   PHYSICAL EXAMINATION: General appearance: alert, cooperative  and no distress Head: Normocephalic, without obvious abnormality, atraumatic Neck: no adenopathy, no JVD, supple, symmetrical, trachea midline and thyroid not enlarged, symmetric, no tenderness/mass/nodules Lymph nodes: Cervical, supraclavicular, and axillary nodes normal. Resp: clear to auscultation bilaterally Back: symmetric, no curvature. ROM normal. No CVA tenderness. Cardio: regular rate and rhythm, S1, S2 normal, no murmur, click, rub or gallop GI: soft, non-tender; bowel sounds normal; no masses,  no organomegaly Extremities: extremities normal, atraumatic, no cyanosis or edema Neurologic: Alert and oriented X 3, normal strength and tone. Normal symmetric reflexes. Normal coordination and gait  ECOG PERFORMANCE STATUS: 0 - Asymptomatic  Blood pressure 155/67, pulse 80, temperature 98.1 F (36.7 C), temperature source Oral, resp. rate 18, height _0  (1.6 m), weight 161 lb 4.8 oz (73.165 kg), SpO2 100 %.  LABORATORY DATA: Lab Results  Component Value Date   WBC 7.2 01/18/2014   HGB 12.1 01/18/2014   HCT 37.3 01/18/2014   MCV 92.6 01/18/2014   PLT 394 01/18/2014      Chemistry      Component Value Date/Time   NA 142 01/18/2014 0953   NA 138 02/05/2013 1026   K 4.6 01/18/2014 0953  K 4.3 02/05/2013 1026   CL 105 02/05/2013 1026   CL 106 06/30/2012 1004   CO2 30* 01/18/2014 0953   CO2 27 02/05/2013 1026   BUN 5.2* 01/18/2014 0953   BUN 9 02/05/2013 1026   CREATININE 0.8 01/18/2014 0953   CREATININE 0.6 02/05/2013 1026      Component Value Date/Time   CALCIUM 9.5 01/18/2014 0953   CALCIUM 9.2 02/05/2013 1026   ALKPHOS 91 01/18/2014 0953   ALKPHOS 75 02/05/2013 1026   AST 14 01/18/2014 0953   AST 17 02/05/2013 1026   ALT 7 01/18/2014 0953   ALT 9 02/05/2013 1026   BILITOT 0.21 01/18/2014 0953   BILITOT 0.4 02/05/2013 1026       RADIOGRAPHIC STUDIES: Ct Chest W Contrast  12/24/2013   CLINICAL DATA:  Followup metastatic right lung carcinoma. Ongoing  chemotherapy. Previous radiation therapy.  EXAM: CT CHEST, ABDOMEN, AND PELVIS WITH CONTRAST  TECHNIQUE: Multidetector CT imaging of the chest, abdomen and pelvis was performed following the standard protocol during bolus administration of intravenous contrast.  CONTRAST:  147m OMNIPAQUE IOHEXOL 300 MG/ML  SOLN  COMPARISON:  10/22/2013  FINDINGS: CT CHEST FINDINGS  Mediastinum/Hilar Regions: No masses or pathologically enlarged lymph nodes identified.  Other Thoracic Lymphadenopathy:  None.  Lungs: Emphysema again noted. Pleural-based opacity in the anterior right upper lobe measures 11 mm on image 16 and is stable. Other areas of scarring in both lung apices also appear stable. No new or enlarging pulmonary nodules or masses are identified.  Pleura:  No evidence of effusion or mass.  Vascular/Cardiac:  No acute findings identified.  Other:  None.  Musculoskeletal:  No suspicious bone lesions identified.  CT ABDOMEN AND PELVIS FINDINGS  Hepatobiliary: No masses or other significant abnormality identified.  Pancreas: No mass, inflammatory changes, or other parenchymal abnormality identified.  Spleen:  Within normal limits in size and appearance.  Adrenal Glands:  No mass identified.  Kidneys/Urinary Tract: No masses identified. No evidence of hydronephrosis.  Stomach/Bowel/Peritoneum: No evidence of wall thickening, mass, or obstruction.  Vascular/Lymphatic: No pathologically enlarged lymph nodes identified. No other significant abnormality identified.  Reproductive:  No mass or other significant abnormality identified.  Other:  None.  Musculoskeletal:  No suspicious bone lesions identified.  IMPRESSION: Stable exam. No evidence of recurrent or metastatic carcinoma within the chest, abdomen, or pelvis.   Electronically Signed   By: JEarle GellM.D.   On: 12/24/2013 08:42   Ct Abdomen Pelvis W Contrast  12/24/2013   CLINICAL DATA:  Followup metastatic right lung carcinoma. Ongoing chemotherapy. Previous radiation  therapy.  EXAM: CT CHEST, ABDOMEN, AND PELVIS WITH CONTRAST  TECHNIQUE: Multidetector CT imaging of the chest, abdomen and pelvis was performed following the standard protocol during bolus administration of intravenous contrast.  CONTRAST:  1070mOMNIPAQUE IOHEXOL 300 MG/ML  SOLN  COMPARISON:  10/22/2013  FINDINGS: CT CHEST FINDINGS  Mediastinum/Hilar Regions: No masses or pathologically enlarged lymph nodes identified.  Other Thoracic Lymphadenopathy:  None.  Lungs: Emphysema again noted. Pleural-based opacity in the anterior right upper lobe measures 11 mm on image 16 and is stable. Other areas of scarring in both lung apices also appear stable. No new or enlarging pulmonary nodules or masses are identified.  Pleura:  No evidence of effusion or mass.  Vascular/Cardiac:  No acute findings identified.  Other:  None.  Musculoskeletal:  No suspicious bone lesions identified.  CT ABDOMEN AND PELVIS FINDINGS  Hepatobiliary: No masses or other significant abnormality identified.  Pancreas: No mass, inflammatory changes, or other parenchymal abnormality identified.  Spleen:  Within normal limits in size and appearance.  Adrenal Glands:  No mass identified.  Kidneys/Urinary Tract: No masses identified. No evidence of hydronephrosis.  Stomach/Bowel/Peritoneum: No evidence of wall thickening, mass, or obstruction.  Vascular/Lymphatic: No pathologically enlarged lymph nodes identified. No other significant abnormality identified.  Reproductive:  No mass or other significant abnormality identified.  Other:  None.  Musculoskeletal:  No suspicious bone lesions identified.  IMPRESSION: Stable exam. No evidence of recurrent or metastatic carcinoma within the chest, abdomen, or pelvis.   Electronically Signed   By: Earle Gell M.D.   On: 12/24/2013 08:42   ASSESSMENT AND PLAN: This is a very pleasant 65 years old white female with metastatic non-small cell lung cancer currently undergoing maintenance chemotherapy with single  agent Alimta status post 1110 cycles. The patient is tolerating her treatment fairly well with no significant adverse effects.  Her recent CT scan of the chest, abdomen and pelvis showed stable disease with no evidence of recurrent or metastatic carcinoma within the chest, abdomen or pelvis. The patient was discussed with and also seen by Dr. Julien Nordmann. She has had stable disease for quite some time. We will change the frequency of her restaging CT scan from after every 3 cycles to after every 6 cycles of maintenance single agent Alimta. She will continue with her current treatment with maintenance Alimta as scheduled. She will receive cycle #111 today. For pain management, she will continue on her current pain medication with MS Contin and MS IR. She was given refill prescriptions for both of these medications as well as for her Xanax tablets. A refill prescription for her Emla cream was sent her pharmacy of record via E scribed She'll follow-up in 3 weeks prior to cycle #112 . She was advised to call immediately if she has any concerning symptoms in the interval. The patient voices understanding of current disease status and treatment options and is in agreement with the current care plan.  All questions were answered. The patient knows to call the clinic with any problems, questions or concerns. We can certainly see the patient much sooner if necessary.  Carlton Adam, PA-C 01/18/2014  ADDENDUM: Hematology/Oncology Attending: I have a face to face encounter with the patient today. I recommended her care plan. This is a very pleasant 64 years old white female with metastatic non-small cell lung cancer currently undergoing maintenance chemotherapy with single agent Alimta status post 110 cycles. She is tolerating the treatment fairly well with no significant adverse effects. She had MRI of the brain performed recently at [facility] that showed no evidence for disease progression. I  recommended for the patient to proceed with cycle #111 today. She would come back for follow-up visit in 3 weeks with the next cycle of her treatment. She has been doing very well over the last few years and I recommended for her to start doing imaging studies every 6 cycles. She was advised to call immediately if she has any concerning symptoms in the interval.  Disclaimer: This note was dictated with voice recognition software. Similar sounding words can inadvertently be transcribed and may not be corrected upon review. Eilleen Kempf., MD 01/18/2014

## 2014-01-18 NOTE — Telephone Encounter (Signed)
Gave avs & cal for Feb. Sent mess to sch tx.

## 2014-01-18 NOTE — Patient Instructions (Signed)
[facility] Discharge Instructions for Patients Receiving Chemotherapy  Today you received the following chemotherapy agent: Alimta   To help prevent nausea and vomiting after your treatment, we encourage you to take your nausea medication as prescribed.    If you develop nausea and vomiting that is not controlled by your nausea medication, call the clinic.   BELOW ARE SYMPTOMS THAT SHOULD BE REPORTED IMMEDIATELY:  *FEVER GREATER THAN 100.5 F  *CHILLS WITH OR WITHOUT FEVER  NAUSEA AND VOMITING THAT IS NOT CONTROLLED WITH YOUR NAUSEA MEDICATION  *UNUSUAL SHORTNESS OF BREATH  *UNUSUAL BRUISING OR BLEEDING  TENDERNESS IN MOUTH AND THROAT WITH OR WITHOUT PRESENCE OF ULCERS  *URINARY PROBLEMS  *BOWEL PROBLEMS  UNUSUAL RASH Items with * indicate a potential emergency and should be followed up as soon as possible.  Feel free to call the clinic you have any questions or concerns. The clinic phone number is [phone].

## 2014-01-19 ENCOUNTER — Other Ambulatory Visit: Payer: Self-pay | Admitting: *Deleted

## 2014-01-19 DIAGNOSIS — C3411 Malignant neoplasm of upper lobe, right bronchus or lung: Secondary | ICD-10-CM

## 2014-02-08 ENCOUNTER — Encounter: Payer: Self-pay | Admitting: *Deleted

## 2014-02-08 ENCOUNTER — Ambulatory Visit (HOSPITAL_BASED_OUTPATIENT_CLINIC_OR_DEPARTMENT_OTHER): Payer: Medicare Other | Admitting: Internal Medicine

## 2014-02-08 ENCOUNTER — Other Ambulatory Visit: Payer: Self-pay | Admitting: Internal Medicine

## 2014-02-08 ENCOUNTER — Encounter: Payer: Self-pay | Admitting: Internal Medicine

## 2014-02-08 ENCOUNTER — Telehealth: Payer: Self-pay | Admitting: Internal Medicine

## 2014-02-08 ENCOUNTER — Other Ambulatory Visit (HOSPITAL_BASED_OUTPATIENT_CLINIC_OR_DEPARTMENT_OTHER): Payer: Medicare Other

## 2014-02-08 ENCOUNTER — Ambulatory Visit (HOSPITAL_BASED_OUTPATIENT_CLINIC_OR_DEPARTMENT_OTHER): Payer: Medicare Other

## 2014-02-08 ENCOUNTER — Other Ambulatory Visit: Payer: Medicare Other

## 2014-02-08 VITALS — BP 105/56 | HR 91 | Temp 97.0°F | Resp 18 | Ht 63.0 in | Wt 161.8 lb

## 2014-02-08 DIAGNOSIS — Z006 Encounter for examination for normal comparison and control in clinical research program: Secondary | ICD-10-CM

## 2014-02-08 DIAGNOSIS — C3411 Malignant neoplasm of upper lobe, right bronchus or lung: Secondary | ICD-10-CM

## 2014-02-08 DIAGNOSIS — Z5111 Encounter for antineoplastic chemotherapy: Secondary | ICD-10-CM | POA: Diagnosis not present

## 2014-02-08 LAB — CBC WITH DIFFERENTIAL/PLATELET
BASO%: 0.1 % (ref 0.0–2.0)
Basophils Absolute: 0 10*3/uL (ref 0.0–0.1)
EOS%: 0.8 % (ref 0.0–7.0)
Eosinophils Absolute: 0.1 10*3/uL (ref 0.0–0.5)
HCT: 36.6 % (ref 34.8–46.6)
HEMOGLOBIN: 11.8 g/dL (ref 11.6–15.9)
LYMPH#: 1.7 10*3/uL (ref 0.9–3.3)
LYMPH%: 20.9 % (ref 14.0–49.7)
MCH: 30.1 pg (ref 25.1–34.0)
MCHC: 32.2 g/dL (ref 31.5–36.0)
MCV: 93.4 fL (ref 79.5–101.0)
MONO#: 1 10*3/uL — ABNORMAL HIGH (ref 0.1–0.9)
MONO%: 11.7 % (ref 0.0–14.0)
NEUT#: 5.5 10*3/uL (ref 1.5–6.5)
NEUT%: 66.5 % (ref 38.4–76.8)
Platelets: 449 10*3/uL — ABNORMAL HIGH (ref 145–400)
RBC: 3.92 10*6/uL (ref 3.70–5.45)
RDW: 13.9 % (ref 11.2–14.5)
WBC: 8.3 10*3/uL (ref 3.9–10.3)

## 2014-02-08 LAB — COMPREHENSIVE METABOLIC PANEL (CC13)
ALT: 6 U/L (ref 0–55)
AST: 12 U/L (ref 5–34)
Albumin: 3.1 g/dL — ABNORMAL LOW (ref 3.5–5.0)
Alkaline Phosphatase: 82 U/L (ref 40–150)
Anion Gap: 9 mEq/L (ref 3–11)
BUN: 9.3 mg/dL (ref 7.0–26.0)
CALCIUM: 9 mg/dL (ref 8.4–10.4)
CO2: 27 mEq/L (ref 22–29)
Chloride: 104 mEq/L (ref 98–109)
Creatinine: 0.8 mg/dL (ref 0.6–1.1)
EGFR: 77 mL/min/{1.73_m2} — ABNORMAL LOW (ref >90)
Glucose: 126 mg/dl (ref 70–140)
POTASSIUM: 4.5 meq/L (ref 3.5–5.1)
Sodium: 140 mEq/L (ref 136–145)
Total Bilirubin: 0.38 mg/dL (ref 0.20–1.20)
Total Protein: 6.8 g/dL (ref 6.4–8.3)

## 2014-02-08 LAB — RESEARCH LABS

## 2014-02-08 MED ORDER — ONDANSETRON 8 MG/NS 50 ML IVPB
INTRAVENOUS | Status: AC
  Filled 2014-02-08: qty 8

## 2014-02-08 MED ORDER — DEXAMETHASONE SODIUM PHOSPHATE 10 MG/ML IJ SOLN
10.0000 mg | Freq: Once | INTRAMUSCULAR | Status: AC
  Administered 2014-02-08: 10 mg via INTRAVENOUS

## 2014-02-08 MED ORDER — SODIUM CHLORIDE 0.9 % IV SOLN
Freq: Once | INTRAVENOUS | Status: AC
  Administered 2014-02-08: 10:00:00 via INTRAVENOUS

## 2014-02-08 MED ORDER — SODIUM CHLORIDE 0.9 % IJ SOLN
10.0000 mL | INTRAMUSCULAR | Status: DC | PRN
  Administered 2014-02-08: 10 mL
  Filled 2014-02-08: qty 10

## 2014-02-08 MED ORDER — ONDANSETRON 8 MG/50ML IVPB (CHCC)
8.0000 mg | Freq: Once | INTRAVENOUS | Status: AC
  Administered 2014-02-08: 8 mg via INTRAVENOUS

## 2014-02-08 MED ORDER — DEXAMETHASONE SODIUM PHOSPHATE 10 MG/ML IJ SOLN
INTRAMUSCULAR | Status: AC
  Filled 2014-02-08: qty 1

## 2014-02-08 MED ORDER — HEPARIN SOD (PORK) LOCK FLUSH 100 UNIT/ML IV SOLN
500.0000 [IU] | Freq: Once | INTRAVENOUS | Status: AC | PRN
  Administered 2014-02-08: 500 [IU]
  Filled 2014-02-08: qty 5

## 2014-02-08 MED ORDER — PEMETREXED DISODIUM CHEMO INJECTION 500 MG
500.0000 mg/m2 | Freq: Once | INTRAVENOUS | Status: AC
  Administered 2014-02-08: 900 mg via INTRAVENOUS
  Filled 2014-02-08: qty 36

## 2014-02-08 NOTE — Progress Notes (Signed)
    [facility] [phone]   [phone]  OFFICE PROGRESS NOTE  Amy Bedsole, MD [address] [place] [address]  Diagnosis:  Metastatic non-small cell lung cancer initially diagnosed with locally advanced stage IIIB with right Pancoast tumor involving the vertebral body as well as the foraminal canal invasion with spinal cord compression in October of 2007.   Prior Therapy:  1. Status post concurrent chemoradiation with weekly carboplatin and paclitaxel, last dose was given November 18, 2005. 2. 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 3. Status post gamma knife radiotherapy to a solitary brain lesion located in the superior frontal area of the brain at [facility] in April of 2008. 4. Status post palliative radiotherapy to the lateral abdominal wall metastatic lesion under the care of Dr. Moody, completed March of 2009. 5. Status post 6 cycles of systemic chemotherapy with carboplatin and Alimta. Last dose was given July 26, 2007 with disease stabilization. 6. Gamma knife stereotactic radiotherapy to 2 brain lesions one involving the right frontal dural based as well as right parietal lesion performed on 05/07/2012 under the care of Dr. Chan at [facility].  Current Therapy:  Maintenance chemotherapy with Alimta 500 mg per meter squared given every 3 weeks. The patient is status post 111 cycles.   CHEMOTHERAPY INTENT: Palliative/maintenance  CURRENT # OF CHEMOTHERAPY CYCLES: 112 CURRENT ANTIEMETICS: Zofran, dexamethasone and Compazine  CURRENT SMOKING STATUS: Quit smoking 10/07/2005  ORAL CHEMOTHERAPY AND CONSENT: None  CURRENT BISPHOSPHONATES USE: None  PAIN MANAGEMENT: 0/10 currently on morphine  NARCOTICS INDUCED CONSTIPATION: No constipation  LIVING WILL AND CODE STATUS: No CODE BLUE   INTERVAL HISTORY: Danielle [name] 64 y.o. female  returns to the clinic today for followup visit accompanied by her sister. The patient is feeling fine today with no specific complaints. No significant fever or chills. She is tolerating her maintenance treatment with single agent Alimta fairly well with no significant adverse effects. The patient denied having any chest pain, shortness of breath, cough or hemoptysis. No nausea or vomiting. The patient denied having any weight loss or night sweats. She is here today to start cycle #112 her chemotherapy.  MEDICAL HISTORY: Past Medical History  Diagnosis Date  . Hypertension   . Anemia   . H/O: pneumonia   . History of tobacco abuse quit 10/08    on nicotine patch  . Fibromyalgia   . Hypokalemia   . DJD (degenerative joint disease), cervical   . Thrush 12/11/2010  . 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 had chemo and radiation for lung ca    ALLERGIES:  is allergic to contrast media; co-trimoxazole injection; iohexol; and sulfa drugs cross reactors.  MEDICATIONS:  Current Outpatient Prescriptions  Medication Sig Dispense Refill  . ALPRAZolam (XANAX) 0.25 MG tablet Take 1 tablet (0.25 mg total) by mouth at bedtime as needed for sleep. 30 tablet 0  . amLODipine (NORVASC) 10 MG tablet Take 1 tablet (10 mg total) by mouth daily. 90 tablet 3  . antipyrine-benzocaine (AURALGAN) otic solution Place 1 drop into both ears daily. 10 mL 1  . fluticasone (FLONASE) 50 MCG/ACT nasal spray USE 1-2 SPRAYS IN EACH NOSTRIL DAILY 16 g 11  . folic acid (FOLVITE) 1 MG tablet TAKE 1 TABLET BY MOUTH DAILY. 90 tablet 1  . lidocaine-prilocaine (EMLA) cream Apply topically as needed. 30 g 1  .   lisinopril (PRINIVIL,ZESTRIL) 20 MG tablet TAKE 2 TABLETS (40 MG TOTAL) BY MOUTH DAILY. 60 tablet 5  . morphine (MS CONTIN) 60 MG 12 hr tablet Take 1 tablet (60 mg total) by mouth 2 (two) times daily. 60 tablet 0  . morphine (MSIR) 30 MG tablet Take 1 tablet (30 mg total) by mouth every 4  (four) hours as needed. 60 tablet 0  . olopatadine (PATANOL) 0.1 % ophthalmic solution Place 1 drop into both eyes 2 (two) times daily. 5 mL 11  . ondansetron (ZOFRAN) 8 MG tablet TAKE 1 TABLET BY MOUTH EVERY 8 HOURS AS NEEDED FOR NAUSEA. 30 tablet 2  . predniSONE (DELTASONE) 50 MG tablet 13 hrs, 7 hrs, and 1 hr before scheduled CT scans 9 tablet 1  . simvastatin (ZOCOR) 20 MG tablet Take 1 tablet (20 mg total) by mouth daily. 90 tablet 3   No current facility-administered medications for this visit.    SURGICAL HISTORY:  Past Surgical History  Procedure Laterality Date  . Cervical laminectomy  1995  . Other surgical history  2008    Gamma knife surgery to remove brain met   . 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 Knee surgery  1990    Left x 2  . Portacath placement  -[name]    TIP IN CAVOATRIAL JUNCTION    REVIEW OF SYSTEMS:  Constitutional: negative Eyes: negative Ears, nose, mouth, throat, and face: negative Respiratory: negative Cardiovascular: negative Gastrointestinal: negative Genitourinary:negative Integument/breast: negative Hematologic/lymphatic: negative Musculoskeletal:negative Neurological: negative Behavioral/Psych: negative Endocrine: negative Allergic/Immunologic: negative   PHYSICAL EXAMINATION: General appearance: alert, cooperative and no distress Head: Normocephalic, without obvious abnormality, atraumatic Neck: no adenopathy, no JVD, supple, symmetrical, trachea midline and thyroid not enlarged, symmetric, no tenderness/mass/nodules Lymph nodes: Cervical, supraclavicular, and axillary nodes normal. Resp: clear to auscultation bilaterally Back: symmetric, no curvature. ROM normal. No CVA tenderness. Cardio: regular rate and rhythm, S1, S2 normal, no murmur, click, rub or gallop GI: soft, non-tender; bowel sounds normal; no masses,  no organomegaly Extremities: extremities normal, atraumatic, no cyanosis or  edema Neurologic: Alert and oriented X 3, normal strength and tone. Normal symmetric reflexes. Normal coordination and gait  ECOG PERFORMANCE STATUS: 0 - Asymptomatic  Blood pressure 105/56, pulse 91, temperature 97 F (36.1 C), temperature source Oral, resp. rate 18, height 5' 3" (1.6 m), weight 161 lb 12.8 oz (73.392 kg).  LABORATORY DATA: Lab Results  Component Value Date   WBC 8.3 02/08/2014   HGB 11.8 02/08/2014   HCT 36.6 02/08/2014   MCV 93.4 02/08/2014   PLT 449* 02/08/2014      Chemistry      Component Value Date/Time   NA 142 01/18/2014 0953   NA 138 02/05/2013 1026   K 4.6 01/18/2014 0953   K 4.3 02/05/2013 1026   CL 105 02/05/2013 1026   CL 106 06/30/2012 1004   CO2 30* 01/18/2014 0953   CO2 27 02/05/2013 1026   BUN 5.2* 01/18/2014 0953   BUN 9 02/05/2013 1026   CREATININE 0.8 01/18/2014 0953   CREATININE 0.6 02/05/2013 1026      Component Value Date/Time   CALCIUM 9.5 01/18/2014 0953   CALCIUM 9.2 02/05/2013 1026   ALKPHOS 91 01/18/2014 0953   ALKPHOS 75 02/05/2013 1026   AST 14 01/18/2014 0953   AST 17 02/05/2013 1026   ALT 7 01/18/2014 0953   ALT 9 02/05/2013 1026   BILITOT 0.21 01/18/2014 0953  BILITOT 0.4 02/05/2013 1026       RADIOGRAPHIC STUDIES: No results found. ASSESSMENT AND PLAN: This is a very pleasant 64 years old white female with metastatic non-small cell lung cancer currently undergoing maintenance chemotherapy with single agent Alimta status post 111 cycles. The patient is tolerating her treatment fairly well with no significant adverse effects.  I recommended for the patient to proceed with her systemic therapy today as scheduled. She will receive cycle #112 today. The patient would come back for followup visit in 3 weeks for evaluation before starting cycle #113 For pain management, she will continue on her current pain medication with MS Contin and MS IR.  She was advised to call immediately if she has any concerning symptoms  in the interval. The patient voices understanding of current disease status and treatment options and is in agreement with the current care plan.  All questions were answered. The patient knows to call the clinic with any problems, questions or concerns. We can certainly see the patient much sooner if necessary.  Disclaimer: This note was dictated with voice recognition software. Similar sounding words can inadvertently be transcribed and may not be corrected upon review.

## 2014-02-08 NOTE — [facility] Oncology Navigator Note (Unsigned)
Spoke with patient at cancer center today.  He will receive cycle 112 today of Alimta.  She is doing very well today.  She was accompanied by her sister.  Sister comes to all of her appointments and is a good support for her.  It was nice seeing both of them today looking and feeling well.

## 2014-02-08 NOTE — Progress Notes (Signed)
BMS CA209-118 - questionnaires (PROs) - patient into the cancer center for routine visit. Patient was given PROs upon arrival to the cancer center. The patient completed her PROs (EQ-5D-3L first and then the LCSS booklet) before any study procedures were performed. I checked the PROs for completeness.  Research blood was drawn.  The patient was thanked for her continued support of this clinical trial.  Barb Thoma Paulsen 02/08/2014 10:25 AM

## 2014-02-08 NOTE — Patient Instructions (Signed)
[place] Discharge Instructions for Patients Receiving Chemotherapy  Today you received the following chemotherapy agents: Alimta  To help prevent nausea and vomiting after your treatment, we encourage you to take your nausea medication as prescribed.   If you develop nausea and vomiting that is not controlled by your nausea medication, call the clinic.   BELOW ARE SYMPTOMS THAT SHOULD BE REPORTED IMMEDIATELY:  *FEVER GREATER THAN 100.5 F  *CHILLS WITH OR WITHOUT FEVER  NAUSEA AND VOMITING THAT IS NOT CONTROLLED WITH YOUR NAUSEA MEDICATION  *UNUSUAL SHORTNESS OF BREATH  *UNUSUAL BRUISING OR BLEEDING  TENDERNESS IN MOUTH AND THROAT WITH OR WITHOUT PRESENCE OF ULCERS  *URINARY PROBLEMS  *BOWEL PROBLEMS  UNUSUAL RASH Items with * indicate a potential emergency and should be followed up as soon as possible.  Feel free to call the clinic you have any questions or concerns. The clinic phone number is [phone].

## 2014-02-08 NOTE — Telephone Encounter (Signed)
gv and printed appt sched and avs fo rpt for Feb and March.Marland KitchenMarland KitchenMarland Kitchen

## 2014-02-18 ENCOUNTER — Other Ambulatory Visit: Payer: Self-pay | Admitting: *Deleted

## 2014-02-18 DIAGNOSIS — C3410 Malignant neoplasm of upper lobe, unspecified bronchus or lung: Secondary | ICD-10-CM

## 2014-02-18 MED ORDER — ONDANSETRON HCL 8 MG PO TABS: ORAL_TABLET | ORAL | Status: DC

## 2014-02-18 NOTE — Telephone Encounter (Signed)
Received fax request for Ondansetron refill.

## 2014-02-21 ENCOUNTER — Telehealth: Payer: Self-pay

## 2014-02-21 DIAGNOSIS — C3410 Malignant neoplasm of upper lobe, unspecified bronchus or lung: Secondary | ICD-10-CM

## 2014-02-21 MED ORDER — MORPHINE SULFATE ER 60 MG PO TBCR: 60.0000 mg | EXTENDED_RELEASE_TABLET | Freq: Two times a day (BID) | ORAL | Status: DC

## 2014-02-21 MED ORDER — MORPHINE SULFATE 30 MG PO TABS: 30.0000 mg | ORAL_TABLET | ORAL | Status: DC | PRN

## 2014-02-21 MED ORDER — ALPRAZOLAM 0.25 MG PO TABS: 0.2500 mg | ORAL_TABLET | Freq: Every evening | ORAL | Status: DC | PRN

## 2014-02-21 NOTE — Telephone Encounter (Signed)
Pt called asking for refills on MS 30 mg, MS 60 mg and xanax. Rx prepared for signture. Pt stated she will come by tomorrow to pick up.

## 2014-03-01 ENCOUNTER — Ambulatory Visit (HOSPITAL_BASED_OUTPATIENT_CLINIC_OR_DEPARTMENT_OTHER): Payer: Medicare Other

## 2014-03-01 ENCOUNTER — Encounter: Payer: Self-pay | Admitting: Physician Assistant

## 2014-03-01 ENCOUNTER — Ambulatory Visit: Payer: Medicare Other

## 2014-03-01 ENCOUNTER — Other Ambulatory Visit (HOSPITAL_BASED_OUTPATIENT_CLINIC_OR_DEPARTMENT_OTHER): Payer: Medicare Other

## 2014-03-01 ENCOUNTER — Ambulatory Visit (HOSPITAL_BASED_OUTPATIENT_CLINIC_OR_DEPARTMENT_OTHER): Payer: Medicare Other | Admitting: Physician Assistant

## 2014-03-01 ENCOUNTER — Telehealth: Payer: Self-pay | Admitting: Internal Medicine

## 2014-03-01 ENCOUNTER — Other Ambulatory Visit: Payer: Medicare Other

## 2014-03-01 VITALS — BP 143/68 | HR 97 | Temp 98.3°F | Resp 18 | Ht 63.0 in | Wt 160.7 lb

## 2014-03-01 DIAGNOSIS — C786 Secondary malignant neoplasm of retroperitoneum and peritoneum: Secondary | ICD-10-CM

## 2014-03-01 DIAGNOSIS — C7931 Secondary malignant neoplasm of brain: Secondary | ICD-10-CM

## 2014-03-01 DIAGNOSIS — C3411 Malignant neoplasm of upper lobe, right bronchus or lung: Secondary | ICD-10-CM

## 2014-03-01 DIAGNOSIS — C7951 Secondary malignant neoplasm of bone: Secondary | ICD-10-CM

## 2014-03-01 DIAGNOSIS — Z5111 Encounter for antineoplastic chemotherapy: Secondary | ICD-10-CM

## 2014-03-01 LAB — CBC WITH DIFFERENTIAL/PLATELET
BASO%: 1.1 % (ref 0.0–2.0)
BASOS ABS: 0.1 10*3/uL (ref 0.0–0.1)
EOS%: 0.6 % (ref 0.0–7.0)
Eosinophils Absolute: 0 10*3/uL (ref 0.0–0.5)
HCT: 37.1 % (ref 34.8–46.6)
HGB: 11.7 g/dL (ref 11.6–15.9)
LYMPH%: 30.3 % (ref 14.0–49.7)
MCH: 29 pg (ref 25.1–34.0)
MCHC: 31.5 g/dL (ref 31.5–36.0)
MCV: 92 fL (ref 79.5–101.0)
MONO#: 1.1 10*3/uL — AB (ref 0.1–0.9)
MONO%: 14.2 % — AB (ref 0.0–14.0)
NEUT#: 4 10*3/uL (ref 1.5–6.5)
NEUT%: 53.8 % (ref 38.4–76.8)
Platelets: 468 10*3/uL — ABNORMAL HIGH (ref 145–400)
RBC: 4.04 10*6/uL (ref 3.70–5.45)
RDW: 14.5 % (ref 11.2–14.5)
WBC: 7.4 10*3/uL (ref 3.9–10.3)
lymph#: 2.2 10*3/uL (ref 0.9–3.3)

## 2014-03-01 LAB — COMPREHENSIVE METABOLIC PANEL (CC13)
ANION GAP: 11 meq/L (ref 3–11)
AST: 11 U/L (ref 5–34)
Albumin: 3.3 g/dL — ABNORMAL LOW (ref 3.5–5.0)
Alkaline Phosphatase: 93 U/L (ref 40–150)
BILIRUBIN TOTAL: 0.25 mg/dL (ref 0.20–1.20)
BUN: 6.8 mg/dL — ABNORMAL LOW (ref 7.0–26.0)
CALCIUM: 9.9 mg/dL (ref 8.4–10.4)
CHLORIDE: 109 meq/L (ref 98–109)
CO2: 26 meq/L (ref 22–29)
Creatinine: 0.8 mg/dL (ref 0.6–1.1)
EGFR: 77 mL/min/{1.73_m2} — ABNORMAL LOW (ref >90)
Glucose: 150 mg/dl — ABNORMAL HIGH (ref 70–140)
Potassium: 5 mEq/L (ref 3.5–5.1)
Sodium: 145 mEq/L (ref 136–145)
Total Protein: 7 g/dL (ref 6.4–8.3)

## 2014-03-01 MED ORDER — DEXAMETHASONE SODIUM PHOSPHATE 10 MG/ML IJ SOLN
10.0000 mg | Freq: Once | INTRAMUSCULAR | Status: AC
  Administered 2014-03-01: 10 mg via INTRAVENOUS

## 2014-03-01 MED ORDER — DEXAMETHASONE SODIUM PHOSPHATE 10 MG/ML IJ SOLN
INTRAMUSCULAR | Status: AC
  Filled 2014-03-01: qty 1

## 2014-03-01 MED ORDER — SODIUM CHLORIDE 0.9 % IJ SOLN
10.0000 mL | INTRAMUSCULAR | Status: DC | PRN
  Administered 2014-03-01: 10 mL
  Filled 2014-03-01: qty 10

## 2014-03-01 MED ORDER — ONDANSETRON 8 MG/NS 50 ML IVPB
INTRAVENOUS | Status: AC
  Filled 2014-03-01: qty 8

## 2014-03-01 MED ORDER — ONDANSETRON 8 MG/50ML IVPB (CHCC)
8.0000 mg | Freq: Once | INTRAVENOUS | Status: AC
  Administered 2014-03-01: 8 mg via INTRAVENOUS

## 2014-03-01 MED ORDER — SODIUM CHLORIDE 0.9 % IV SOLN
Freq: Once | INTRAVENOUS | Status: AC
  Administered 2014-03-01: 11:00:00 via INTRAVENOUS

## 2014-03-01 MED ORDER — SODIUM CHLORIDE 0.9 % IV SOLN
500.0000 mg/m2 | Freq: Once | INTRAVENOUS | Status: AC
  Administered 2014-03-01: 900 mg via INTRAVENOUS
  Filled 2014-03-01: qty 36

## 2014-03-01 MED ORDER — HEPARIN SOD (PORK) LOCK FLUSH 100 UNIT/ML IV SOLN
500.0000 [IU] | Freq: Once | INTRAVENOUS | Status: AC | PRN
  Administered 2014-03-01: 500 [IU]
  Filled 2014-03-01: qty 5

## 2014-03-01 NOTE — Patient Instructions (Signed)
[place] Discharge Instructions for Patients Receiving Chemotherapy  Today you received the following chemotherapy agents: Alimta.  To help prevent nausea and vomiting after your treatment, we encourage you to take your nausea medication: Zofran 8 mg every 8 hours.   If you develop nausea and vomiting that is not controlled by your nausea medication, call the clinic.   BELOW ARE SYMPTOMS THAT SHOULD BE REPORTED IMMEDIATELY:  *FEVER GREATER THAN 100.5 F  *CHILLS WITH OR WITHOUT FEVER  NAUSEA AND VOMITING THAT IS NOT CONTROLLED WITH YOUR NAUSEA MEDICATION  *UNUSUAL SHORTNESS OF BREATH  *UNUSUAL BRUISING OR BLEEDING  TENDERNESS IN MOUTH AND THROAT WITH OR WITHOUT PRESENCE OF ULCERS  *URINARY PROBLEMS  *BOWEL PROBLEMS  UNUSUAL RASH Items with * indicate a potential emergency and should be followed up as soon as possible.  Feel free to call the clinic you have any questions or concerns. The clinic phone number is [phone].

## 2014-03-01 NOTE — Telephone Encounter (Signed)
Pt confirmed labs/ov per 02/23 POF, gave pt AVS... KJ,sent msg for MD to schedule pt in on the 03/15

## 2014-03-01 NOTE — Progress Notes (Addendum)
[place] [phone]   [phone]  OFFICE PROGRESS NOTE  [name], MD [place] [address]  Diagnosis:  Metastatic non-small cell lung cancer initially diagnosed with locally advanced stage IIIB with right Pancoast tumor involving the vertebral body as well as the foraminal canal invasion with spinal cord compression in October of 2007.   Prior Therapy:  1. Status post concurrent chemoradiation with weekly carboplatin and paclitaxel, last dose was given November 18, 2005. 2. 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 3. Status post gamma knife radiotherapy to a solitary brain lesion located in the superior frontal area of the brain at [facility] in April of 2008. 4. Status post palliative radiotherapy to the lateral abdominal wall metastatic lesion under the care of Dr. Lisbeth Renshaw, completed March of 2009. 5. Status post 6 cycles of systemic chemotherapy with carboplatin and Alimta. Last dose was given July 26, 2007 with disease stabilization. 6. Gamma knife stereotactic radiotherapy to 2 brain lesions one involving the right frontal dural based as well as right parietal lesion performed on 05/07/2012 under the care of Dr. Vallarie Mare at [facility].  Current Therapy:  Maintenance chemotherapy with Alimta 500 mg per meter squared given every 3 weeks. The patient is status post 112 cycles.   CHEMOTHERAPY INTENT: Palliative/maintenance  CURRENT # OF CHEMOTHERAPY CYCLES: 113 CURRENT ANTIEMETICS: Zofran, dexamethasone and Compazine  CURRENT SMOKING STATUS: Quit smoking 10/07/2005  ORAL CHEMOTHERAPY AND CONSENT: None  CURRENT BISPHOSPHONATES USE: None  PAIN MANAGEMENT: 0/10 currently on morphine  NARCOTICS INDUCED CONSTIPATION: No constipation  LIVING WILL AND CODE STATUS: No CODE BLUE   INTERVAL HISTORY: Danielle [name] 64 y.o. female  returns to the clinic today for followup visit accompanied by her sister. The patient is feeling fine today with no specific complaints. No significant fever or chills. She is tolerating her maintenance treatment with single agent Alimta fairly well with no significant adverse effects. The patient denied having any chest pain, shortness of breath, cough or hemoptysis. No nausea or vomiting. The patient denied having any weight loss or night sweats. She is here today to start cycle #113 her chemotherapy.  MEDICAL HISTORY: Past Medical History  Diagnosis Date  . Hypertension   . Anemia   . H/O: pneumonia   . History of tobacco abuse quit 10/08    on nicotine patch  . Fibromyalgia   . Hypokalemia   . DJD (degenerative joint disease), cervical   . Thrush 12/11/2010  . 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 had chemo and radiation for lung ca    ALLERGIES:  is allergic to contrast media; co-trimoxazole injection; iohexol; and sulfa drugs cross reactors.  MEDICATIONS:  Current Outpatient Prescriptions  Medication Sig Dispense Refill  . ALPRAZolam (XANAX) 0.25 MG tablet Take 1 tablet (0.25 mg total) by mouth at bedtime as needed for sleep. 30 tablet 0  . amLODipine (NORVASC) 10 MG tablet Take 1 tablet (10 mg total) by mouth daily. 90 tablet 3  . antipyrine-benzocaine (AURALGAN) otic solution Place 1 drop into both ears daily. 10 mL 1  . fluticasone (FLONASE) 50 MCG/ACT nasal spray USE 1-2 SPRAYS IN EACH NOSTRIL DAILY 16 g 11  . folic acid (FOLVITE) 1 MG tablet TAKE 1 TABLET BY MOUTH DAILY. 90 tablet 1  . lidocaine-prilocaine (EMLA) cream Apply topically as needed. 30 g 1  .  lisinopril (PRINIVIL,ZESTRIL) 20 MG tablet TAKE 2 TABLETS (40 MG TOTAL) BY MOUTH DAILY. 60 tablet 5  . morphine (MS CONTIN) 60 MG 12 hr tablet Take 1 tablet (60 mg total) by mouth 2 (two) times daily. 60 tablet 0  . morphine (MSIR) 30 MG tablet Take 1 tablet (30 mg total) by mouth every 4  (four) hours as needed. 60 tablet 0  . olopatadine (PATANOL) 0.1 % ophthalmic solution Place 1 drop into both eyes 2 (two) times daily. 5 mL 11  . ondansetron (ZOFRAN) 8 MG tablet TAKE 1 TABLET BY MOUTH EVERY 8 HOURS AS NEEDED FOR NAUSEA. 30 tablet 2  . predniSONE (DELTASONE) 50 MG tablet 13 hrs, 7 hrs, and 1 hr before scheduled CT scans 9 tablet 1  . simvastatin (ZOCOR) 20 MG tablet Take 1 tablet (20 mg total) by mouth daily. 90 tablet 3   No current facility-administered medications for this visit.    SURGICAL HISTORY:  Past Surgical History  Procedure Laterality Date  . Cervical laminectomy  1995  . Other surgical history  2008    Gamma knife surgery to remove brain met   . 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 Knee surgery  1990    Left x 2  . Portacath placement  -[name]    TIP IN CAVOATRIAL JUNCTION    REVIEW OF SYSTEMS:  Constitutional: negative Eyes: negative Ears, nose, mouth, throat, and face: negative Respiratory: negative Cardiovascular: negative Gastrointestinal: negative Genitourinary:negative Integument/breast: negative Hematologic/lymphatic: negative Musculoskeletal:negative Neurological: negative Behavioral/Psych: negative Endocrine: negative Allergic/Immunologic: negative   PHYSICAL EXAMINATION: General appearance: alert, cooperative and no distress Head: Normocephalic, without obvious abnormality, atraumatic Neck: no adenopathy, no JVD, supple, symmetrical, trachea midline and thyroid not enlarged, symmetric, no tenderness/mass/nodules Lymph nodes: Cervical, supraclavicular, and axillary nodes normal. Resp: clear to auscultation bilaterally Back: symmetric, no curvature. ROM normal. No CVA tenderness. Cardio: regular rate and rhythm, S1, S2 normal, no murmur, click, rub or gallop GI: soft, non-tender; bowel sounds normal; no masses,  no organomegaly Extremities: extremities normal, atraumatic, no cyanosis or  edema Neurologic: Alert and oriented X 3, normal strength and tone. Normal symmetric reflexes. Normal coordination and gait  ECOG PERFORMANCE STATUS: 0 - Asymptomatic  Blood pressure 143/68, pulse 97, temperature 98.3 F (36.8 C), temperature source Oral, resp. rate 18, height '5\' 3"'  (1.6 m), weight 160 lb 11.2 oz (72.893 kg), SpO2 99 %.  LABORATORY DATA: Lab Results  Component Value Date   WBC 7.4 03/01/2014   HGB 11.7 03/01/2014   HCT 37.1 03/01/2014   MCV 92.0 03/01/2014   PLT 468* 03/01/2014      Chemistry      Component Value Date/Time   NA 145 03/01/2014 0948   NA 138 02/05/2013 1026   K 5.0 03/01/2014 0948   K 4.3 02/05/2013 1026   CL 105 02/05/2013 1026   CL 106 06/30/2012 1004   CO2 26 03/01/2014 0948   CO2 27 02/05/2013 1026   BUN 6.8* 03/01/2014 0948   BUN 9 02/05/2013 1026   CREATININE 0.8 03/01/2014 0948   CREATININE 0.6 02/05/2013 1026      Component Value Date/Time   CALCIUM 9.9 03/01/2014 0948   CALCIUM 9.2 02/05/2013 1026   ALKPHOS 93 03/01/2014 0948   ALKPHOS 75 02/05/2013 1026   AST 11 03/01/2014 0948   AST 17 02/05/2013 1026   ALT <6 03/01/2014 0948   ALT 9 02/05/2013 1026   BILITOT  0.25 03/01/2014 0948   BILITOT 0.4 02/05/2013 1026       RADIOGRAPHIC STUDIES: No results found. ASSESSMENT AND PLAN: This is a very pleasant 64 years old white female with metastatic non-small cell lung cancer currently undergoing maintenance chemotherapy with single agent Alimta status post 112 cycles. The patient is tolerating her treatment fairly well with no significant adverse effects.  Patient was discussed with and also seen by Dr. Julien Nordmann. She will continue on her maintenance chemotherapy with single agent Alimta. I recommended for the patient to proceed with her systemic therapy today as scheduled. She will receive cycle #113 today. The patient would come back for followup visit in 3 weeks for evaluation before starting cycle #114. She will be due for her  next restaging CT scan on approximately 04/05/2014 or 04/06/2014. For pain management, she will continue on her current pain medication with MS Contin and MS IR.  She was advised to call immediately if she has any concerning symptoms in the interval. The patient voices understanding of current disease status and treatment options and is in agreement with the current care plan.  All questions were answered. The patient knows to call the clinic with any problems, questions or concerns. We can certainly see the patient much sooner if necessary.  Carlton Adam, PA-C 03/01/2014  ADDENDUM: Hematology/Oncology Attending: I had a face to face encounter with the patient. I recommended her care plan. This is a very pleasant 64 years old white female with metastatic non-small cell lung cancer status post induction chemotherapy and currently on maintenance chemotherapy with single agent Alimta status post 112 cycles and tolerating her treatment fairly well. I recommended for the patient to proceed with her treatment today as a scheduled. She will come back for follow-up visit in 3 weeks with the next cycle of her treatment. We will continue to monitor her with imaging studies every 6 cycles. She was advised to call immediately if she has any concerning symptoms in the interval.  Disclaimer: This note was dictated with voice recognition software. Similar sounding words can inadvertently be transcribed and may not be corrected upon review. Eilleen Kempf., MD 03/05/2014

## 2014-03-02 ENCOUNTER — Telehealth: Payer: Self-pay | Admitting: *Deleted

## 2014-03-02 NOTE — Telephone Encounter (Signed)
Called asking why she does not have MD appointment before next chemotherapy treatment.  Noted MD will see her in the infusion room.  Second  Request is can she have surgery or work on her teeth while receiving chemotherapy.  Advised that all dental work be saved until after chemotherapy completed unless an emergency.  "My count are good.  I've broken a tooth and do not have any pain.  I'll wait until week before treatment to see dentist."  This nurse advised that labs may be needed and to let us know what dentist recommends.  Will notify Dr. Julien Nordmann of this call.

## 2014-03-02 NOTE — Telephone Encounter (Signed)
She should have an appointment with me that day before chemo. Ok to have dental evaluation and tooth work up 3rd week before next chemo.

## 2014-03-03 ENCOUNTER — Encounter: Payer: Self-pay | Admitting: *Deleted

## 2014-03-04 NOTE — Patient Instructions (Signed)
Follow-up in 3 weeks

## 2014-03-11 NOTE — Telephone Encounter (Signed)
Per MD ok to put on [facility] schedule to be seen in chemo.... kj

## 2014-03-17 ENCOUNTER — Other Ambulatory Visit: Payer: Self-pay | Admitting: *Deleted

## 2014-03-17 DIAGNOSIS — C3411 Malignant neoplasm of upper lobe, right bronchus or lung: Secondary | ICD-10-CM

## 2014-03-22 ENCOUNTER — Encounter: Payer: Self-pay | Admitting: Internal Medicine

## 2014-03-22 ENCOUNTER — Other Ambulatory Visit: Payer: Self-pay | Admitting: Medical Oncology

## 2014-03-22 ENCOUNTER — Telehealth: Payer: Self-pay | Admitting: Internal Medicine

## 2014-03-22 ENCOUNTER — Encounter: Payer: Self-pay | Admitting: *Deleted

## 2014-03-22 ENCOUNTER — Ambulatory Visit (HOSPITAL_BASED_OUTPATIENT_CLINIC_OR_DEPARTMENT_OTHER): Payer: Medicare Other

## 2014-03-22 ENCOUNTER — Other Ambulatory Visit (HOSPITAL_BASED_OUTPATIENT_CLINIC_OR_DEPARTMENT_OTHER): Payer: Medicare Other

## 2014-03-22 ENCOUNTER — Ambulatory Visit (HOSPITAL_BASED_OUTPATIENT_CLINIC_OR_DEPARTMENT_OTHER): Payer: Medicare Other | Admitting: Internal Medicine

## 2014-03-22 DIAGNOSIS — Z5111 Encounter for antineoplastic chemotherapy: Secondary | ICD-10-CM

## 2014-03-22 DIAGNOSIS — C3411 Malignant neoplasm of upper lobe, right bronchus or lung: Secondary | ICD-10-CM | POA: Diagnosis not present

## 2014-03-22 DIAGNOSIS — C3410 Malignant neoplasm of upper lobe, unspecified bronchus or lung: Secondary | ICD-10-CM

## 2014-03-22 LAB — CBC WITH DIFFERENTIAL/PLATELET
BASO%: 1.2 % (ref 0.0–2.0)
BASOS ABS: 0.1 10*3/uL (ref 0.0–0.1)
EOS%: 0.9 % (ref 0.0–7.0)
Eosinophils Absolute: 0.1 10*3/uL (ref 0.0–0.5)
HCT: 38.8 % (ref 34.8–46.6)
HEMOGLOBIN: 12.3 g/dL (ref 11.6–15.9)
LYMPH#: 2 10*3/uL (ref 0.9–3.3)
LYMPH%: 29.9 % (ref 14.0–49.7)
MCH: 29.1 pg (ref 25.1–34.0)
MCHC: 31.8 g/dL (ref 31.5–36.0)
MCV: 91.6 fL (ref 79.5–101.0)
MONO#: 0.9 10*3/uL (ref 0.1–0.9)
MONO%: 13.2 % (ref 0.0–14.0)
NEUT#: 3.7 10*3/uL (ref 1.5–6.5)
NEUT%: 54.8 % (ref 38.4–76.8)
Platelets: 430 10*3/uL — ABNORMAL HIGH (ref 145–400)
RBC: 4.23 10*6/uL (ref 3.70–5.45)
RDW: 15.4 % — AB (ref 11.2–14.5)
WBC: 6.7 10*3/uL (ref 3.9–10.3)

## 2014-03-22 LAB — COMPREHENSIVE METABOLIC PANEL (CC13)
ALBUMIN: 3.5 g/dL (ref 3.5–5.0)
ALT: 10 U/L (ref 0–55)
AST: 16 U/L (ref 5–34)
Alkaline Phosphatase: 87 U/L (ref 40–150)
Anion Gap: 8 mEq/L (ref 3–11)
BUN: 6.7 mg/dL — AB (ref 7.0–26.0)
CO2: 26 mEq/L (ref 22–29)
Calcium: 10 mg/dL (ref 8.4–10.4)
Chloride: 110 mEq/L — ABNORMAL HIGH (ref 98–109)
Creatinine: 0.8 mg/dL (ref 0.6–1.1)
EGFR: 83 mL/min/{1.73_m2} — ABNORMAL LOW (ref >90)
Glucose: 115 mg/dl (ref 70–140)
POTASSIUM: 4.3 meq/L (ref 3.5–5.1)
Sodium: 144 mEq/L (ref 136–145)
Total Bilirubin: 0.33 mg/dL (ref 0.20–1.20)
Total Protein: 7.1 g/dL (ref 6.4–8.3)

## 2014-03-22 LAB — RESEARCH LABS

## 2014-03-22 MED ORDER — HEPARIN SOD (PORK) LOCK FLUSH 100 UNIT/ML IV SOLN
500.0000 [IU] | Freq: Once | INTRAVENOUS | Status: AC | PRN
  Administered 2014-03-22: 500 [IU]
  Filled 2014-03-22: qty 5

## 2014-03-22 MED ORDER — ALPRAZOLAM 0.25 MG PO TABS: 0.2500 mg | ORAL_TABLET | Freq: Every evening | ORAL | Status: DC | PRN

## 2014-03-22 MED ORDER — MORPHINE SULFATE 30 MG PO TABS: 30.0000 mg | ORAL_TABLET | ORAL | Status: DC | PRN

## 2014-03-22 MED ORDER — MORPHINE SULFATE ER 60 MG PO TBCR: 60.0000 mg | EXTENDED_RELEASE_TABLET | Freq: Two times a day (BID) | ORAL | Status: DC

## 2014-03-22 MED ORDER — SODIUM CHLORIDE 0.9 % IJ SOLN
10.0000 mL | INTRAMUSCULAR | Status: DC | PRN
  Administered 2014-03-22: 10 mL
  Filled 2014-03-22: qty 10

## 2014-03-22 MED ORDER — SODIUM CHLORIDE 0.9 % IV SOLN
Freq: Once | INTRAVENOUS | Status: AC
  Administered 2014-03-22: 11:00:00 via INTRAVENOUS
  Filled 2014-03-22: qty 4

## 2014-03-22 MED ORDER — SODIUM CHLORIDE 0.9 % IV SOLN
500.0000 mg/m2 | Freq: Once | INTRAVENOUS | Status: AC
  Administered 2014-03-22: 900 mg via INTRAVENOUS
  Filled 2014-03-22: qty 36

## 2014-03-22 MED ORDER — SODIUM CHLORIDE 0.9 % IV SOLN
Freq: Once | INTRAVENOUS | Status: AC
  Administered 2014-03-22: 11:00:00 via INTRAVENOUS

## 2014-03-22 MED ORDER — CYANOCOBALAMIN 1000 MCG/ML IJ SOLN
1000.0000 ug | Freq: Once | INTRAMUSCULAR | Status: AC
  Administered 2014-03-22: 1000 ug via INTRAMUSCULAR

## 2014-03-22 MED ORDER — CYANOCOBALAMIN 1000 MCG/ML IJ SOLN
INTRAMUSCULAR | Status: AC
  Filled 2014-03-22: qty 1

## 2014-03-22 NOTE — Progress Notes (Signed)
03/22/2014 Met with patient at her regularly scheduled visit. Patient signed Authorization for Use/Disclores of PHI (ROI Form) for use by the Clinical Research Department, in the event that any records are needed from other healthcare providers outside of the [facility] system. Thanked patient for her participation in the observational research study. Cindy S. Brigitte Pulse BSN, RN, [place] 03/22/2014 10:53 AM

## 2014-03-22 NOTE — Progress Notes (Signed)
Prescriptions for morphine x 2 and xanax given to pt.

## 2014-03-22 NOTE — Patient Instructions (Signed)
[place] Discharge Instructions for Patients Receiving Chemotherapy  Today you received the following chemotherapy agents Alimta.  To help prevent nausea and vomiting after your treatment, we encourage you to take your nausea medication as prescribed.   If you develop nausea and vomiting that is not controlled by your nausea medication, call the clinic.   BELOW ARE SYMPTOMS THAT SHOULD BE REPORTED IMMEDIATELY:  *FEVER GREATER THAN 100.5 F  *CHILLS WITH OR WITHOUT FEVER  NAUSEA AND VOMITING THAT IS NOT CONTROLLED WITH YOUR NAUSEA MEDICATION  *UNUSUAL SHORTNESS OF BREATH  *UNUSUAL BRUISING OR BLEEDING  TENDERNESS IN MOUTH AND THROAT WITH OR WITHOUT PRESENCE OF ULCERS  *URINARY PROBLEMS  *BOWEL PROBLEMS  UNUSUAL RASH Items with * indicate a potential emergency and should be followed up as soon as possible.  Feel free to call the clinic you have any questions or concerns. The clinic phone number is [phone].

## 2014-03-22 NOTE — Progress Notes (Signed)
[place] [phone]   [phone]  OFFICE PROGRESS NOTE  [name], MD [place] [address]  Diagnosis:  Metastatic non-small cell lung cancer initially diagnosed with locally advanced stage IIIB with right Pancoast tumor involving the vertebral body as well as the foraminal canal invasion with spinal cord compression in October of 2007.   Prior Therapy:  1. Status post concurrent chemoradiation with weekly carboplatin and paclitaxel, last dose was given November 18, 2005. 2. 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 3. Status post gamma knife radiotherapy to a solitary brain lesion located in the superior frontal area of the brain at [facility] in April of 2008. 4. Status post palliative radiotherapy to the lateral abdominal wall metastatic lesion under the care of Dr. Lisbeth Renshaw, completed March of 2009. 5. Status post 6 cycles of systemic chemotherapy with carboplatin and Alimta. Last dose was given July 26, 2007 with disease stabilization. 6. Gamma knife stereotactic radiotherapy to 2 brain lesions one involving the right frontal dural based as well as right parietal lesion performed on 05/07/2012 under the care of Dr. Vallarie Mare at [facility].  Current Therapy:  Maintenance chemotherapy with Alimta 500 mg per meter squared given every 3 weeks. The patient is status post 113 cycles.   CHEMOTHERAPY INTENT: Palliative/maintenance  CURRENT # OF CHEMOTHERAPY CYCLES: 114 CURRENT ANTIEMETICS: Zofran, dexamethasone and Compazine  CURRENT SMOKING STATUS: Quit smoking 10/07/2005  ORAL CHEMOTHERAPY AND CONSENT: None  CURRENT BISPHOSPHONATES USE: None  PAIN MANAGEMENT: 0/10 currently on morphine  NARCOTICS INDUCED CONSTIPATION: No constipation  LIVING WILL AND CODE STATUS: No CODE BLUE   INTERVAL HISTORY: Danielle [name] 64 y.o. female  returns to the clinic today for followup visit accompanied by her sister. She was seen at the chemotherapy treatment room today. The patient is feeling fine today with no specific complaints. No significant fever or chills. She is tolerating her maintenance treatment with single agent Alimta fairly well with no significant adverse effects except for few episodes of nausea resolved with her antiemetics. The patient denied having any chest pain, shortness of breath, cough or hemoptysis. No nausea or vomiting. The patient denied having any weight loss or night sweats. She is here today to start cycle #114 her chemotherapy.  MEDICAL HISTORY: Past Medical History  Diagnosis Date  . Hypertension   . Anemia   . H/O: pneumonia   . History of tobacco abuse quit 10/08    on nicotine patch  . Fibromyalgia   . Hypokalemia   . DJD (degenerative joint disease), cervical   . Thrush 12/11/2010  . 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 had chemo and radiation for lung ca    ALLERGIES:  is allergic to contrast media; co-trimoxazole injection; iohexol; and sulfa drugs cross reactors.  MEDICATIONS:  Current Outpatient Prescriptions  Medication Sig Dispense Refill  . ALPRAZolam (XANAX) 0.25 MG tablet Take 1 tablet (0.25 mg total) by mouth at bedtime as needed for sleep. 30 tablet 0  . amLODipine (NORVASC) 10 MG tablet Take 1 tablet (10 mg total) by mouth daily. 90 tablet 3  . antipyrine-benzocaine (AURALGAN) otic solution Place 1 drop into both ears daily. 10 mL 1  . fluticasone (FLONASE) 50 MCG/ACT nasal spray USE 1-2 SPRAYS IN EACH NOSTRIL DAILY 16 g 11  . folic acid (FOLVITE) 1 MG tablet TAKE 1  TABLET BY MOUTH DAILY. 90 tablet 1  . lidocaine-prilocaine (EMLA) cream Apply topically as needed. 30 g 1  . lisinopril (PRINIVIL,ZESTRIL) 20 MG tablet TAKE 2 TABLETS (40 MG TOTAL) BY MOUTH DAILY. 60 tablet 5  . morphine (MS CONTIN) 60 MG 12 hr tablet Take 1 tablet (60 mg total) by  mouth 2 (two) times daily. 60 tablet 0  . morphine (MSIR) 30 MG tablet Take 1 tablet (30 mg total) by mouth every 4 (four) hours as needed. 60 tablet 0  . olopatadine (PATANOL) 0.1 % ophthalmic solution Place 1 drop into both eyes 2 (two) times daily. 5 mL 11  . ondansetron (ZOFRAN) 8 MG tablet TAKE 1 TABLET BY MOUTH EVERY 8 HOURS AS NEEDED FOR NAUSEA. 30 tablet 2  . predniSONE (DELTASONE) 50 MG tablet 13 hrs, 7 hrs, and 1 hr before scheduled CT scans 9 tablet 1  . simvastatin (ZOCOR) 20 MG tablet Take 1 tablet (20 mg total) by mouth daily. 90 tablet 3   No current facility-administered medications for this visit.    SURGICAL HISTORY:  Past Surgical History  Procedure Laterality Date  . Cervical laminectomy  1995  . Other surgical history  2008    Gamma knife surgery to remove brain met   . 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 Knee surgery  1990    Left x 2  . Portacath placement  -[name]    TIP IN CAVOATRIAL JUNCTION    REVIEW OF SYSTEMS:  Constitutional: negative Eyes: negative Ears, nose, mouth, throat, and face: negative Respiratory: negative Cardiovascular: negative Gastrointestinal: negative Genitourinary:negative Integument/breast: negative Hematologic/lymphatic: negative Musculoskeletal:negative Neurological: negative Behavioral/Psych: negative Endocrine: negative Allergic/Immunologic: negative   PHYSICAL EXAMINATION: General appearance: alert, cooperative and no distress Head: Normocephalic, without obvious abnormality, atraumatic Neck: no adenopathy, no JVD, supple, symmetrical, trachea midline and thyroid not enlarged, symmetric, no tenderness/mass/nodules Lymph nodes: Cervical, supraclavicular, and axillary nodes normal. Resp: clear to auscultation bilaterally Back: symmetric, no curvature. ROM normal. No CVA tenderness. Cardio: regular rate and rhythm, S1, S2 normal, no murmur, click, rub or gallop GI: soft, non-tender;  bowel sounds normal; no masses,  no organomegaly Extremities: extremities normal, atraumatic, no cyanosis or edema Neurologic: Alert and oriented X 3, normal strength and tone. Normal symmetric reflexes. Normal coordination and gait  ECOG PERFORMANCE STATUS: 0 - Asymptomatic  There were no vitals taken for this visit.  LABORATORY DATA: Lab Results  Component Value Date   WBC 6.7 03/22/2014   HGB 12.3 03/22/2014   HCT 38.8 03/22/2014   MCV 91.6 03/22/2014   PLT 430* 03/22/2014      Chemistry      Component Value Date/Time   NA 144 03/22/2014 0925   NA 138 02/05/2013 1026   K 4.3 03/22/2014 0925   K 4.3 02/05/2013 1026   CL 105 02/05/2013 1026   CL 106 06/30/2012 1004   CO2 26 03/22/2014 0925   CO2 27 02/05/2013 1026   BUN 6.7* 03/22/2014 0925   BUN 9 02/05/2013 1026   CREATININE 0.8 03/22/2014 0925   CREATININE 0.6 02/05/2013 1026      Component Value Date/Time   CALCIUM 10.0 03/22/2014 0925   CALCIUM 9.2 02/05/2013 1026   ALKPHOS 87 03/22/2014 0925   ALKPHOS 75 02/05/2013 1026   AST 16 03/22/2014 0925   AST 17 02/05/2013 1026   ALT 10 03/22/2014 0925   ALT 9 02/05/2013 1026   BILITOT 0.33 03/22/2014  4970   BILITOT 0.4 02/05/2013 1026       RADIOGRAPHIC STUDIES: No results found. ASSESSMENT AND PLAN: This is a very pleasant 64 years old white female with metastatic non-small cell lung cancer currently undergoing maintenance chemotherapy with single agent Alimta status post 113 cycles. The patient is tolerating her treatment fairly well with no significant adverse effects.  I recommended for the patient to proceed with her systemic therapy today as scheduled. She will receive cycle #114 today. The patient would come back for followup visit in 3 weeks for evaluation before starting cycle #115. She would have repeat CT scan of the chest, abdomen and pelvis after the next cycle of her treatment. For pain management, she will continue on her current pain medication  with MS Contin and MS IR.  She was advised to call immediately if she has any concerning symptoms in the interval. The patient voices understanding of current disease status and treatment options and is in agreement with the current care plan.  All questions were answered. The patient knows to call the clinic with any problems, questions or concerns. We can certainly see the patient much sooner if necessary.  Disclaimer: This note was dictated with voice recognition software. Similar sounding words can inadvertently be transcribed and may not be corrected upon review.

## 2014-03-22 NOTE — Telephone Encounter (Signed)
gv adn rpinted appt sched anda vs for pt for April...sed added tx.

## 2014-03-22 NOTE — Progress Notes (Signed)
BMS [id number] - questionnaires (PROs) - patient into the cancer center for routine visit. Patient was given PROs upon arrival to the cancer center. The patient completed her PROs (EQ-5D-3L first and then the LCSS booklet) before any study procedures were performed. I checked the PROs for completeness.  Research blood was drawn.  The patient was thanked for her continued support of this clinical trial. Barb Yonael Tulloch 03/22/2014.11:06AM

## 2014-04-08 DIAGNOSIS — I639 Cerebral infarction, unspecified: Secondary | ICD-10-CM

## 2014-04-08 HISTORY — DX: Cerebral infarction, unspecified: I63.9

## 2014-04-12 ENCOUNTER — Other Ambulatory Visit (HOSPITAL_BASED_OUTPATIENT_CLINIC_OR_DEPARTMENT_OTHER): Payer: Medicare Other

## 2014-04-12 ENCOUNTER — Telehealth: Payer: Self-pay | Admitting: Internal Medicine

## 2014-04-12 ENCOUNTER — Other Ambulatory Visit: Payer: Medicare Other

## 2014-04-12 ENCOUNTER — Encounter: Payer: Self-pay | Admitting: Physician Assistant

## 2014-04-12 ENCOUNTER — Ambulatory Visit: Payer: Medicare Other

## 2014-04-12 ENCOUNTER — Ambulatory Visit (HOSPITAL_BASED_OUTPATIENT_CLINIC_OR_DEPARTMENT_OTHER): Payer: Medicare Other

## 2014-04-12 ENCOUNTER — Ambulatory Visit (HOSPITAL_BASED_OUTPATIENT_CLINIC_OR_DEPARTMENT_OTHER): Payer: Medicare Other | Admitting: Physician Assistant

## 2014-04-12 VITALS — BP 127/57 | HR 92 | Temp 97.9°F | Resp 18 | Ht 63.0 in | Wt 160.7 lb

## 2014-04-12 DIAGNOSIS — C3411 Malignant neoplasm of upper lobe, right bronchus or lung: Secondary | ICD-10-CM

## 2014-04-12 DIAGNOSIS — C7931 Secondary malignant neoplasm of brain: Secondary | ICD-10-CM

## 2014-04-12 DIAGNOSIS — C3410 Malignant neoplasm of upper lobe, unspecified bronchus or lung: Secondary | ICD-10-CM

## 2014-04-12 DIAGNOSIS — Z5111 Encounter for antineoplastic chemotherapy: Secondary | ICD-10-CM

## 2014-04-12 LAB — CBC WITH DIFFERENTIAL/PLATELET
BASO%: 0.4 % (ref 0.0–2.0)
Basophils Absolute: 0 10*3/uL (ref 0.0–0.1)
EOS ABS: 0 10*3/uL (ref 0.0–0.5)
EOS%: 0.5 % (ref 0.0–7.0)
HCT: 33.8 % — ABNORMAL LOW (ref 34.8–46.6)
HGB: 10.9 g/dL — ABNORMAL LOW (ref 11.6–15.9)
LYMPH#: 2.3 10*3/uL (ref 0.9–3.3)
LYMPH%: 30.4 % (ref 14.0–49.7)
MCH: 30.1 pg (ref 25.1–34.0)
MCHC: 32.2 g/dL (ref 31.5–36.0)
MCV: 93.4 fL (ref 79.5–101.0)
MONO#: 1.1 10*3/uL — ABNORMAL HIGH (ref 0.1–0.9)
MONO%: 14.4 % — AB (ref 0.0–14.0)
NEUT#: 4.2 10*3/uL (ref 1.5–6.5)
NEUT%: 54.3 % (ref 38.4–76.8)
Platelets: 351 10*3/uL (ref 145–400)
RBC: 3.62 10*6/uL — AB (ref 3.70–5.45)
RDW: 14.8 % — AB (ref 11.2–14.5)
WBC: 7.7 10*3/uL (ref 3.9–10.3)

## 2014-04-12 LAB — COMPREHENSIVE METABOLIC PANEL (CC13)
ALK PHOS: 76 U/L (ref 40–150)
ALT: 9 U/L (ref 0–55)
AST: 15 U/L (ref 5–34)
Albumin: 3.1 g/dL — ABNORMAL LOW (ref 3.5–5.0)
Anion Gap: 7 mEq/L (ref 3–11)
BUN: 9.1 mg/dL (ref 7.0–26.0)
CO2: 25 meq/L (ref 22–29)
CREATININE: 0.9 mg/dL (ref 0.6–1.1)
Calcium: 9.1 mg/dL (ref 8.4–10.4)
Chloride: 107 mEq/L (ref 98–109)
EGFR: 73 mL/min/{1.73_m2} — ABNORMAL LOW (ref >90)
Glucose: 109 mg/dl (ref 70–140)
POTASSIUM: 4.4 meq/L (ref 3.5–5.1)
SODIUM: 140 meq/L (ref 136–145)
Total Bilirubin: 0.29 mg/dL (ref 0.20–1.20)
Total Protein: 6.6 g/dL (ref 6.4–8.3)

## 2014-04-12 MED ORDER — SODIUM CHLORIDE 0.9 % IV SOLN
Freq: Once | INTRAVENOUS | Status: AC
  Administered 2014-04-12: 11:00:00 via INTRAVENOUS
  Filled 2014-04-12: qty 4

## 2014-04-12 MED ORDER — PREDNISONE 50 MG PO TABS: ORAL_TABLET | ORAL | Status: DC

## 2014-04-12 MED ORDER — SODIUM CHLORIDE 0.9 % IJ SOLN
10.0000 mL | INTRAMUSCULAR | Status: DC | PRN
  Administered 2014-04-12: 10 mL
  Filled 2014-04-12: qty 10

## 2014-04-12 MED ORDER — SODIUM CHLORIDE 0.9 % IV SOLN
500.0000 mg/m2 | Freq: Once | INTRAVENOUS | Status: AC
  Administered 2014-04-12: 900 mg via INTRAVENOUS
  Filled 2014-04-12: qty 36

## 2014-04-12 MED ORDER — SODIUM CHLORIDE 0.9 % IV SOLN
Freq: Once | INTRAVENOUS | Status: AC
  Administered 2014-04-12: 11:00:00 via INTRAVENOUS

## 2014-04-12 MED ORDER — HEPARIN SOD (PORK) LOCK FLUSH 100 UNIT/ML IV SOLN
500.0000 [IU] | Freq: Once | INTRAVENOUS | Status: AC | PRN
  Administered 2014-04-12: 500 [IU]
  Filled 2014-04-12: qty 5

## 2014-04-12 NOTE — Patient Instructions (Signed)
[facility] Discharge Instructions for Patients Receiving Chemotherapy  Today you received the following chemotherapy agents Alimta  To help prevent nausea and vomiting after your treatment, we encourage you to take your nausea medication as directed/prescribed   If you develop nausea and vomiting that is not controlled by your nausea medication, call the clinic.   BELOW ARE SYMPTOMS THAT SHOULD BE REPORTED IMMEDIATELY:  *FEVER GREATER THAN 100.5 F  *CHILLS WITH OR WITHOUT FEVER  NAUSEA AND VOMITING THAT IS NOT CONTROLLED WITH YOUR NAUSEA MEDICATION  *UNUSUAL SHORTNESS OF BREATH  *UNUSUAL BRUISING OR BLEEDING  TENDERNESS IN MOUTH AND THROAT WITH OR WITHOUT PRESENCE OF ULCERS  *URINARY PROBLEMS  *BOWEL PROBLEMS  UNUSUAL RASH Items with * indicate a potential emergency and should be followed up as soon as possible.  Feel free to call the clinic you have any questions or concerns. The clinic phone number is [phone].  Please show the [facility] at check-in to the Emergency Department and triage nurse.

## 2014-04-12 NOTE — Telephone Encounter (Signed)
gave and printed appt sched and avs fo rpt for April and May....sed added tx.

## 2014-04-12 NOTE — Progress Notes (Addendum)
[facility] [phone]   [phone]  OFFICE PROGRESS NOTE  [name], MD [place] [address]  Diagnosis:  Metastatic non-small cell lung cancer initially diagnosed with locally advanced stage IIIB with right Pancoast tumor involving the vertebral body as well as the foraminal canal invasion with spinal cord compression in October of 2007.   Prior Therapy:  1. Status post concurrent chemoradiation with weekly carboplatin and paclitaxel, last dose was given November 18, 2005. 2. 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 3. Status post gamma knife radiotherapy to a solitary brain lesion located in the superior frontal area of the brain at [facility] in April of 2008. 4. Status post palliative radiotherapy to the lateral abdominal wall metastatic lesion under the care of Dr. Lisbeth Renshaw, completed March of 2009. 5. Status post 6 cycles of systemic chemotherapy with carboplatin and Alimta. Last dose was given July 26, 2007 with disease stabilization. 6. Gamma knife stereotactic radiotherapy to 2 brain lesions one involving the right frontal dural based as well as right parietal lesion performed on 05/07/2012 under the care of Dr. Vallarie Mare at [facility].  Current Therapy:  Maintenance chemotherapy with Alimta 500 mg per meter squared given every 3 weeks. The patient is status post 114 cycles.   CHEMOTHERAPY INTENT: Palliative/maintenance  CURRENT # OF CHEMOTHERAPY CYCLES: 115 CURRENT ANTIEMETICS: Zofran, dexamethasone and Compazine  CURRENT SMOKING STATUS: Quit smoking 10/07/2005  ORAL CHEMOTHERAPY AND CONSENT: None  CURRENT BISPHOSPHONATES USE: None  PAIN MANAGEMENT: 0/10 currently on morphine  NARCOTICS INDUCED CONSTIPATION: No constipation  LIVING WILL AND CODE STATUS: No CODE BLUE   INTERVAL HISTORY: TEESHA OHM 64 y.o. female  returns to the clinic today for followup visit accompanied by her sister.  The patient is feeling fine today with no specific complaints. No significant fever or chills. She is tolerating her maintenance treatment with single agent Alimta fairly well with no significant adverse effects except for few episodes of nausea resolved with her antiemetics. The patient denied having any chest pain, shortness of breath, cough or hemoptysis. No nausea or vomiting. The patient denied having any weight loss or night sweats. She is here today to start cycle #115 her chemotherapy. She requests a refill for the prednisone she takes prior to her restaging CT scans with contrast.  MEDICAL HISTORY: Past Medical History  Diagnosis Date  . Hypertension   . Anemia   . H/O: pneumonia   . History of tobacco abuse quit 10/08    on nicotine patch  . Fibromyalgia   . Hypokalemia   . DJD (degenerative joint disease), cervical   . Thrush 12/11/2010  . 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 had chemo and radiation for lung ca    ALLERGIES:  is allergic to contrast media; co-trimoxazole injection; iohexol; and sulfa drugs cross reactors.  MEDICATIONS:  Current Outpatient Prescriptions  Medication Sig Dispense Refill  . ALPRAZolam (XANAX) 0.25 MG tablet Take 1 tablet (0.25 mg total) by mouth at bedtime as needed for sleep. 30 tablet 0  . amLODipine (NORVASC) 10 MG tablet Take 1 tablet (10 mg total) by mouth daily. 90 tablet 3  . antipyrine-benzocaine (AURALGAN) otic solution Place 1 drop into both ears daily. 10 mL 1  . fluticasone (FLONASE) 50 MCG/ACT nasal spray USE 1-2 SPRAYS IN EACH NOSTRIL DAILY 16 g 11  .  folic acid (FOLVITE) 1 MG tablet TAKE 1 TABLET BY MOUTH DAILY. 90 tablet 1  . lidocaine-prilocaine (EMLA) cream Apply topically as needed. 30 g 1  . lisinopril (PRINIVIL,ZESTRIL) 20 MG tablet TAKE 2 TABLETS (40 MG TOTAL) BY MOUTH DAILY. 60 tablet 5  . morphine (MS CONTIN) 60 MG 12  hr tablet Take 1 tablet (60 mg total) by mouth 2 (two) times daily. 60 tablet 0  . morphine (MSIR) 30 MG tablet Take 1 tablet (30 mg total) by mouth every 4 (four) hours as needed. 60 tablet 0  . olopatadine (PATANOL) 0.1 % ophthalmic solution Place 1 drop into both eyes 2 (two) times daily. 5 mL 11  . ondansetron (ZOFRAN) 8 MG tablet TAKE 1 TABLET BY MOUTH EVERY 8 HOURS AS NEEDED FOR NAUSEA. 30 tablet 2  . predniSONE (DELTASONE) 50 MG tablet 13 hrs, 7 hrs, and 1 hr before scheduled CT scans 9 tablet 1  . simvastatin (ZOCOR) 20 MG tablet Take 1 tablet (20 mg total) by mouth daily. 90 tablet 3   No current facility-administered medications for this visit.    SURGICAL HISTORY:  Past Surgical History  Procedure Laterality Date  . Cervical laminectomy  1995  . Other surgical history  2008    Gamma knife surgery to remove brain met   . 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 Knee surgery  1990    Left x 2  . Portacath placement  -[name]    TIP IN CAVOATRIAL JUNCTION    REVIEW OF SYSTEMS:  Constitutional: negative Eyes: negative Ears, nose, mouth, throat, and face: negative Respiratory: negative Cardiovascular: negative Gastrointestinal: negative Genitourinary:negative Integument/breast: negative Hematologic/lymphatic: negative Musculoskeletal:negative Neurological: negative Behavioral/Psych: negative Endocrine: negative Allergic/Immunologic: negative   PHYSICAL EXAMINATION: General appearance: alert, cooperative and no distress Head: Normocephalic, without obvious abnormality, atraumatic Neck: no adenopathy, no JVD, supple, symmetrical, trachea midline and thyroid not enlarged, symmetric, no tenderness/mass/nodules Lymph nodes: Cervical, supraclavicular, and axillary nodes normal. Resp: clear to auscultation bilaterally Back: symmetric, no curvature. ROM normal. No CVA tenderness. Cardio: regular rate and rhythm, S1, S2 normal, no murmur, click,  rub or gallop GI: soft, non-tender; bowel sounds normal; no masses,  no organomegaly Extremities: extremities normal, atraumatic, no cyanosis or edema Neurologic: Alert and oriented X 3, normal strength and tone. Normal symmetric reflexes. Normal coordination and gait  ECOG PERFORMANCE STATUS: 0 - Asymptomatic  Blood pressure 127/57, pulse 92, temperature 97.9 F (36.6 C), temperature source Oral, resp. rate 18, height _0  (1.6 m), weight 160 lb 11.2 oz (72.893 kg), SpO2 92 %.  LABORATORY DATA: Lab Results  Component Value Date   WBC 7.7 04/12/2014   HGB 10.9* 04/12/2014   HCT 33.8* 04/12/2014   MCV 93.4 04/12/2014   PLT 351 04/12/2014      Chemistry      Component Value Date/Time   NA 140 04/12/2014 0916   NA 138 02/05/2013 1026   K 4.4 04/12/2014 0916   K 4.3 02/05/2013 1026   CL 105 02/05/2013 1026   CL 106 06/30/2012 1004   CO2 25 04/12/2014 0916   CO2 27 02/05/2013 1026   BUN 9.1 04/12/2014 0916   BUN 9 02/05/2013 1026   CREATININE 0.9 04/12/2014 0916   CREATININE 0.6 02/05/2013 1026      Component Value Date/Time   CALCIUM 9.1 04/12/2014 0916   CALCIUM 9.2 02/05/2013 1026   ALKPHOS 76 04/12/2014 0916   ALKPHOS 75  02/05/2013 1026   AST 15 04/12/2014 0916   AST 17 02/05/2013 1026   ALT 9 04/12/2014 0916   ALT 9 02/05/2013 1026   BILITOT 0.29 04/12/2014 0916   BILITOT 0.4 02/05/2013 1026       RADIOGRAPHIC STUDIES: No results found.   ASSESSMENT AND PLAN: This is a very pleasant 64 years old white female with metastatic non-small cell lung cancer currently undergoing maintenance chemotherapy with single agent Alimta status post 113 cycles. The patient is tolerating her treatment fairly well with no significant adverse effects.  The patient was discussed with and also seen by Dr. Julien Nordmann. She will receive cycle #115 today. A refill prescription fore prednisone 50 mg to be take 13 hours, 7 hours and 1 hour before her CT scan with contrast was sent to her  pharmacy of record via [facility]. She will proceed with cycle # 115 of maintenance Alimta today as scheduled. She will return in 3 weeks for cycle # 116. She will also return in 3 weeks with a restaging Ct scan of her chest, abdomen and pelvis with contrast to re-evaluate her disease. She will take prednisone and Benadryl and prednisone as previously prescribed prior to the CT scan due to her sensitivity to contrast.i For pain management, she will continue on her current pain medication with MS Contin and MS IR.  She was advised to call immediately if she has any concerning symptoms in the interval. The patient voices understanding of current disease status and treatment options and is in agreement with the current care plan.  All questions were answered. The patient knows to call the clinic with any problems, questions or concerns. We can certainly see the patient much sooner if necessary.  Carlton Adam, PA-C 04/12/2014  ADDENDUM: Hematology/Oncology Attending:  I had a face to face encounter with the patient. I recommended her care plan. This is a very pleasant 64 years old white female with metastatic non-small cell lung cancer with multiple brain metastasis and has been on treatment with maintenance Alimta for the last 4 years status post 114 cycles. The patient is doing fine and tolerating her treatment fairly well with no significant adverse effects. I recommended for her to proceed with cycle #115 today as scheduled. She will come back for follow-up visit in 3 weeks for reevaluation after repeating CT scan of the chest, abdomen and pelvis for restaging of her disease. The patient is followed by Dr. Bridgett Larsson at [facility] for her brain lesions. She is scheduled to have MRI of the brain in May 2016. She was advised to call immediately if she has any concerning symptoms in the interval.  Disclaimer: This note was dictated with voice recognition software. Similar sounding words can  inadvertently be transcribed and may be missed upon review. Eilleen Kempf., MD 04/17/2014

## 2014-04-15 NOTE — Patient Instructions (Signed)
Follow up in 3 weeks with a restaging CT scan of your chest abdomen and pelvis to re-evaluate your diseae

## 2014-04-16 ENCOUNTER — Emergency Department (HOSPITAL_COMMUNITY): Payer: Medicare Other

## 2014-04-16 ENCOUNTER — Encounter (HOSPITAL_COMMUNITY): Payer: Self-pay | Admitting: Emergency Medicine

## 2014-04-16 ENCOUNTER — Inpatient Hospital Stay (HOSPITAL_COMMUNITY)
Admission: EM | Admit: 2014-04-16 | Discharge: 2014-04-19 | DRG: 064 | Disposition: A | Discharge status: Home / Self Care | Payer: Medicare Other | Attending: Internal Medicine | Admitting: Internal Medicine

## 2014-04-16 DIAGNOSIS — E785 Hyperlipidemia, unspecified: Secondary | ICD-10-CM | POA: Diagnosis present

## 2014-04-16 DIAGNOSIS — I10 Essential (primary) hypertension: Secondary | ICD-10-CM | POA: Diagnosis present

## 2014-04-16 DIAGNOSIS — I6340 Cerebral infarction due to embolism of unspecified cerebral artery: Secondary | ICD-10-CM

## 2014-04-16 DIAGNOSIS — G9389 Other specified disorders of brain: Secondary | ICD-10-CM | POA: Diagnosis not present

## 2014-04-16 DIAGNOSIS — I6789 Other cerebrovascular disease: Secondary | ICD-10-CM | POA: Diagnosis not present

## 2014-04-16 DIAGNOSIS — N179 Acute kidney failure, unspecified: Secondary | ICD-10-CM | POA: Diagnosis not present

## 2014-04-16 DIAGNOSIS — G9340 Encephalopathy, unspecified: Secondary | ICD-10-CM | POA: Diagnosis not present

## 2014-04-16 DIAGNOSIS — R930 Abnormal findings on diagnostic imaging of skull and head, not elsewhere classified: Secondary | ICD-10-CM | POA: Diagnosis not present

## 2014-04-16 DIAGNOSIS — M797 Fibromyalgia: Secondary | ICD-10-CM | POA: Diagnosis present

## 2014-04-16 DIAGNOSIS — I63412 Cerebral infarction due to embolism of left middle cerebral artery: Principal | ICD-10-CM | POA: Diagnosis present

## 2014-04-16 DIAGNOSIS — C3411 Malignant neoplasm of upper lobe, right bronchus or lung: Secondary | ICD-10-CM | POA: Diagnosis not present

## 2014-04-16 DIAGNOSIS — R4182 Altered mental status, unspecified: Secondary | ICD-10-CM

## 2014-04-16 DIAGNOSIS — R4701 Aphasia: Secondary | ICD-10-CM | POA: Diagnosis present

## 2014-04-16 DIAGNOSIS — Z923 Personal history of irradiation: Secondary | ICD-10-CM

## 2014-04-16 DIAGNOSIS — R7989 Other specified abnormal findings of blood chemistry: Secondary | ICD-10-CM | POA: Diagnosis not present

## 2014-04-16 DIAGNOSIS — Z85118 Personal history of other malignant neoplasm of bronchus and lung: Secondary | ICD-10-CM

## 2014-04-16 DIAGNOSIS — C7931 Secondary malignant neoplasm of brain: Secondary | ICD-10-CM | POA: Diagnosis not present

## 2014-04-16 DIAGNOSIS — R531 Weakness: Secondary | ICD-10-CM | POA: Diagnosis not present

## 2014-04-16 DIAGNOSIS — R4781 Slurred speech: Secondary | ICD-10-CM

## 2014-04-16 DIAGNOSIS — G936 Cerebral edema: Secondary | ICD-10-CM | POA: Diagnosis present

## 2014-04-16 DIAGNOSIS — N390 Urinary tract infection, site not specified: Secondary | ICD-10-CM | POA: Diagnosis present

## 2014-04-16 DIAGNOSIS — Z9221 Personal history of antineoplastic chemotherapy: Secondary | ICD-10-CM | POA: Diagnosis not present

## 2014-04-16 DIAGNOSIS — I639 Cerebral infarction, unspecified: Secondary | ICD-10-CM

## 2014-04-16 DIAGNOSIS — I152 Hypertension secondary to endocrine disorders: Secondary | ICD-10-CM | POA: Diagnosis present

## 2014-04-16 DIAGNOSIS — Z87891 Personal history of nicotine dependence: Secondary | ICD-10-CM

## 2014-04-16 DIAGNOSIS — C3490 Malignant neoplasm of unspecified part of unspecified bronchus or lung: Secondary | ICD-10-CM | POA: Diagnosis not present

## 2014-04-16 DIAGNOSIS — E1159 Type 2 diabetes mellitus with other circulatory complications: Secondary | ICD-10-CM | POA: Diagnosis present

## 2014-04-16 DIAGNOSIS — R410 Disorientation, unspecified: Secondary | ICD-10-CM

## 2014-04-16 DIAGNOSIS — G939 Disorder of brain, unspecified: Secondary | ICD-10-CM

## 2014-04-16 DIAGNOSIS — N3000 Acute cystitis without hematuria: Secondary | ICD-10-CM | POA: Diagnosis not present

## 2014-04-16 LAB — COMPREHENSIVE METABOLIC PANEL
ALBUMIN: 3.7 g/dL (ref 3.5–5.2)
ALK PHOS: 60 U/L (ref 39–117)
ALT: 21 U/L (ref 0–35)
AST: 24 U/L (ref 0–37)
Anion gap: 11 (ref 5–15)
BUN: 35 mg/dL — ABNORMAL HIGH (ref 6–23)
CALCIUM: 9.3 mg/dL (ref 8.4–10.5)
CO2: 26 mmol/L (ref 19–32)
CREATININE: 1.73 mg/dL — AB (ref 0.50–1.10)
Chloride: 99 mmol/L (ref 96–112)
GFR calc non Af Amer: 31 mL/min — ABNORMAL LOW (ref >90)
GFR, EST AFRICAN AMERICAN: 36 mL/min — AB (ref >90)
GLUCOSE: 132 mg/dL — AB (ref 70–99)
Potassium: 3.8 mmol/L (ref 3.5–5.1)
SODIUM: 136 mmol/L (ref 135–145)
TOTAL PROTEIN: 6.7 g/dL (ref 6.0–8.3)
Total Bilirubin: 0.8 mg/dL (ref 0.3–1.2)

## 2014-04-16 LAB — CBC
HCT: 37.7 % (ref 36.0–46.0)
Hemoglobin: 12 g/dL (ref 12.0–15.0)
MCH: 29.5 pg (ref 26.0–34.0)
MCHC: 31.8 g/dL (ref 30.0–36.0)
MCV: 92.6 fL (ref 78.0–100.0)
PLATELETS: 447 10*3/uL — AB (ref 150–400)
RBC: 4.07 MIL/uL (ref 3.87–5.11)
RDW: 14.4 % (ref 11.5–15.5)
WBC: 11.9 10*3/uL — AB (ref 4.0–10.5)

## 2014-04-16 LAB — CBG MONITORING, ED: Glucose-Capillary: 121 mg/dL — ABNORMAL HIGH (ref 70–99)

## 2014-04-16 MED ORDER — DEXAMETHASONE SODIUM PHOSPHATE 4 MG/ML IJ SOLN
4.0000 mg | Freq: Four times a day (QID) | INTRAMUSCULAR | Status: DC
  Administered 2014-04-17: 4 mg via INTRAVENOUS
  Filled 2014-04-16 (×6): qty 1

## 2014-04-16 MED ORDER — SODIUM CHLORIDE 0.9 % IV SOLN
INTRAVENOUS | Status: DC
  Administered 2014-04-16: via INTRAVENOUS

## 2014-04-16 MED ORDER — ALPRAZOLAM 0.25 MG PO TABS: 0.2500 mg | ORAL_TABLET | Freq: Every evening | ORAL | Status: DC | PRN

## 2014-04-16 MED ORDER — HEPARIN SODIUM (PORCINE) 5000 UNIT/ML IJ SOLN
5000.0000 [IU] | Freq: Three times a day (TID) | INTRAMUSCULAR | Status: DC
  Administered 2014-04-16 – 2014-04-17 (×2): 5000 [IU] via SUBCUTANEOUS
  Filled 2014-04-16 (×5): qty 1

## 2014-04-16 MED ORDER — MORPHINE SULFATE 15 MG PO TABS: 30.0000 mg | ORAL_TABLET | ORAL | Status: DC | PRN

## 2014-04-16 MED ORDER — DEXAMETHASONE SODIUM PHOSPHATE 10 MG/ML IJ SOLN
10.0000 mg | Freq: Once | INTRAMUSCULAR | Status: AC
  Administered 2014-04-16: 10 mg via INTRAVENOUS
  Filled 2014-04-16: qty 1

## 2014-04-16 MED ORDER — AMLODIPINE BESYLATE 10 MG PO TABS
10.0000 mg | ORAL_TABLET | Freq: Every day | ORAL | Status: DC
  Administered 2014-04-17 – 2014-04-19 (×3): 10 mg via ORAL
  Filled 2014-04-16 (×3): qty 1

## 2014-04-16 MED ORDER — OLOPATADINE HCL 0.1 % OP SOLN
1.0000 [drp] | Freq: Two times a day (BID) | OPHTHALMIC | Status: DC
  Administered 2014-04-16 – 2014-04-18 (×5): 1 [drp] via OPHTHALMIC
  Filled 2014-04-16: qty 5

## 2014-04-16 MED ORDER — SIMVASTATIN 20 MG PO TABS
20.0000 mg | ORAL_TABLET | Freq: Every day | ORAL | Status: DC
  Administered 2014-04-17 – 2014-04-18 (×2): 20 mg via ORAL
  Filled 2014-04-16 (×2): qty 1

## 2014-04-16 NOTE — ED Provider Notes (Signed)
CSN: [id number]     Arrival date & time 04/16/14  1940 History   First MD Initiated Contact with Patient 04/16/14 1958     Chief Complaint  Patient presents with  . Altered Mental Status  . Cancer     (Consider location/radiation/quality/duration/timing/severity/associated sxs/prior Treatment) Patient is a 64 y.o. female presenting with altered mental status. The history is provided by the patient and a relative. The history is limited by the condition of the patient.  Altered Mental Status Presenting symptoms: no confusion   Associated symptoms: no abdominal pain, no fever, no headaches, no rash, no vomiting and no weakness   pt w hx metastatic lung ca, presents w altered mental status, confusion, word finding trouble since approximately 1 pm today.  Son indicates he had seen her shortly after she got up this morning, and she seemed like her normal self.  He indicates later in day, approximately 1 pm, he again talked to her and she seemed confused, and her speech didn't make sense. Those symptoms have persisted since. Pt denies headache. Pt has little insight into current symptoms, stating everything seems normal to her. Pt denies change in speech or vision. No headaches. No numbness or weakness. Denies problems w balance, walking, or coordination. No fever or chills. Compliant w normal meds, denies any recent change in meds. Hx brain met w prior gamma knife procedure x 2.      Past Medical History  Diagnosis Date  . Hypertension   . Anemia   . H/O: pneumonia   . History of tobacco abuse quit 10/08    on nicotine patch  . Fibromyalgia   . Hypokalemia   . DJD (degenerative joint disease), cervical   . Thrush 12/11/2010  . 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 had chemo and radiation for lung ca   Past Surgical History  Procedure Laterality Date  . Cervical laminectomy  1995  . Other surgical history  2008    Gamma knife surgery to remove brain met    . 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 Knee surgery  1990    Left x 2  . Portacath placement  -[name]    TIP IN CAVOATRIAL JUNCTION   Family History  Problem Relation Age of Onset  . Diabetes Father   . Lymphoma Father   . Cancer Father   . Liver cancer Mother   . Lung cancer Mother   . Cancer Mother   . Hyperlipidemia Brother   . Healthy Daughter   . Healthy Son   . Healthy Son   . Cancer Sister 20    ovarian cancer   History  Substance Use Topics  . Smoking status: Former Smoker -- 2.00 packs/day for 30 years    Quit date: 10/07/2005  . Smokeless tobacco: Never Used  . Alcohol Use: No   OB History    No data available     Review of Systems  Constitutional: Negative for fever and chills.  HENT: Negative for sore throat.   Eyes: Negative for visual disturbance.  Respiratory: Negative for shortness of breath.   Cardiovascular: Negative for chest pain.  Gastrointestinal: Negative for vomiting, abdominal pain and diarrhea.  Endocrine: Negative for polyuria.  Genitourinary: Negative for dysuria and flank pain.  Musculoskeletal: Negative for back pain and neck pain.  Skin: Negative for rash.  Neurological: Negative for weakness, numbness and headaches.  Hematological: Does not bruise/bleed easily.  Psychiatric/Behavioral: Negative for confusion.      Allergies  Contrast media; Co-trimoxazole injection; Iohexol; and Sulfa drugs cross reactors  Home Medications   Prior to Admission medications   Medication Sig Start Date End Date Taking? Authorizing Provider  ALPRAZolam (XANAX) 0.25 MG tablet Take 1 tablet (0.25 mg total) by mouth at bedtime as needed for sleep. 03/22/14   Curt Bears, MD  amLODipine (NORVASC) 10 MG tablet Take 1 tablet (10 mg total) by mouth daily. 05/13/13   Amy Cletis Athens, MD  antipyrine-benzocaine Toniann Fail) otic solution Place 1 drop into both ears daily. 11/19/13   Amy Cletis Athens, MD  fluticasone (FLONASE)  50 MCG/ACT nasal spray USE 1-2 SPRAYS IN EACH NOSTRIL DAILY 09/03/13   Amy Cletis Athens, MD  folic acid (FOLVITE) 1 MG tablet TAKE 1 TABLET BY MOUTH DAILY. 02/08/14   Curt Bears, MD  lidocaine-prilocaine (EMLA) cream Apply topically as needed. 05/11/13 05/11/14  Curt Bears, MD  lisinopril (PRINIVIL,ZESTRIL) 20 MG tablet TAKE 2 TABLETS (40 MG TOTAL) BY MOUTH DAILY. 12/29/13   Amy Cletis Athens, MD  morphine (MS CONTIN) 60 MG 12 hr tablet Take 1 tablet (60 mg total) by mouth 2 (two) times daily. 03/22/14   Curt Bears, MD  morphine (MSIR) 30 MG tablet Take 1 tablet (30 mg total) by mouth every 4 (four) hours as needed. 03/22/14   Curt Bears, MD  olopatadine (PATANOL) 0.1 % ophthalmic solution Place 1 drop into both eyes 2 (two) times daily. 08/06/12   Amy Cletis Athens, MD  ondansetron (ZOFRAN) 8 MG tablet TAKE 1 TABLET BY MOUTH EVERY 8 HOURS AS NEEDED FOR NAUSEA. 02/18/14   Carlton Adam, PA-C  predniSONE (DELTASONE) 50 MG tablet 13 hrs, 7 hrs, and 1 hr before scheduled CT scans 04/12/14   [name] E Johnson, PA-C  simvastatin (ZOCOR) 20 MG tablet Take 1 tablet (20 mg total) by mouth daily. 05/13/13   [name] Bedsole, MD   BP 117/61 mmHg  Pulse 96  Resp 15  SpO2 92% Physical Exam  Constitutional: She appears well-developed and well-nourished. No distress.  HENT:  Head: Atraumatic.  Mouth/Throat: Oropharynx is clear and moist.  Eyes: Conjunctivae are normal. Pupils are equal, round, and reactive to light. No scleral icterus.  Neck: Neck supple. No tracheal deviation present. No thyromegaly present.  No bruit  Cardiovascular: Normal rate, regular rhythm, normal heart sounds and intact distal pulses.   Pulmonary/Chest: Effort normal and breath sounds normal. No respiratory distress.  Abdominal: Soft. Normal appearance and bowel sounds are normal. She exhibits no distension. There is no tenderness.  Genitourinary:  No cva tenderness  Musculoskeletal: She exhibits no edema or tenderness.  CTLS spine,  non tender, aligned, no step off. Good rom bil ext.   Neurological: She is alert. No cranial nerve deficit.  Alert, oriented to person and place. When asked what day it is she says 'Tuesday' (today is Saturday). When asked year, patient states 'Wednesday'.    No pronator drift. Motor intact bil, stre 5/5. sens grossly intact.   Skin: Skin is warm and dry. No rash noted. She is not diaphoretic.  Psychiatric: She has a normal mood and affect.  Nursing note and vitals reviewed.   ED Course  Procedures (including critical care time) Labs Review  Results for orders placed or performed during the hospital encounter of 04/16/14  CBC  Result Value Ref Range   WBC 11.9 (H) 4.0 - 10.5 K/uL   RBC  4.07 3.87 - 5.11 MIL/uL   Hemoglobin 12.0 12.0 - 15.0 g/dL   HCT 37.7 36.0 - 46.0 %   MCV 92.6 78.0 - 100.0 fL   MCH 29.5 26.0 - 34.0 pg   MCHC 31.8 30.0 - 36.0 g/dL   RDW 14.4 11.5 - 15.5 %   Platelets 447 (H) 150 - 400 K/uL  Comprehensive metabolic panel  Result Value Ref Range   Sodium 136 135 - 145 mmol/L   Potassium 3.8 3.5 - 5.1 mmol/L   Chloride 99 96 - 112 mmol/L   CO2 26 19 - 32 mmol/L   Glucose, Bld 132 (H) 70 - 99 mg/dL   BUN 35 (H) 6 - 23 mg/dL   Creatinine, Ser 1.73 (H) 0.50 - 1.10 mg/dL   Calcium 9.3 8.4 - 10.5 mg/dL   Total Protein 6.7 6.0 - 8.3 g/dL   Albumin 3.7 3.5 - 5.2 g/dL   AST 24 0 - 37 U/L   ALT 21 0 - 35 U/L   Alkaline Phosphatase 60 39 - 117 U/L   Total Bilirubin 0.8 0.3 - 1.2 mg/dL   GFR calc non Af Amer 31 (L) >90 mL/min   GFR calc Af Amer 36 (L) >90 mL/min   Anion gap 11 5 - 15  CBG monitoring, ED  Result Value Ref Range   Glucose-Capillary 121 (H) 70 - 99 mg/dL   Ct Head (brain) Wo Contrast  04/16/2014   CLINICAL DATA:  Altered mental status and slurred speech. Last seen baseline at approximately noon today. Began noticing altered status at 15:00 today. Weakness, slurred speech, unsteady on feet, confusion. History of lung cancer.  EXAM: CT HEAD WITHOUT  CONTRAST  TECHNIQUE: Contiguous axial images were obtained from the base of the skull through the vertex without intravenous contrast.  COMPARISON:  MRI brain 10/09/2006  FINDINGS: There is subtle low-attenuation change with loss of gray-white matter distinction in the left frontal lobe. Area of involvement measures about 2.6 x 3.1 cm. Appearance may represent focal ischemia or metastasis. Suggest MRI for further evaluation. Ventricles and sulci are otherwise symmetrical. No ventricular dilatation. No abnormal extra-axial fluid collections. Basal cisterns are not effaced. No acute intracranial hemorrhage. Mucosal thickening throughout the paranasal sinuses with opacification in some of the ethmoid air cells. Mastoid air cells are not opacified. Calvarium appears intact.  IMPRESSION: Subtle area of low attenuation in the left frontal lobe. This could represent focal ischemia or metastasis. Suggest MRI for further evaluation. No acute intracranial hemorrhage. No significant mass effect.   Electronically Signed   By: Lucienne Capers M.D.   On: 04/16/2014 22:09       EKG Interpretation   Date/Time:  Saturday April 16 2014 19:52:30 EDT Ventricular Rate:  95 PR Interval:  123 QRS Duration: 74 QT Interval:  347 QTC Calculation: 436 R Axis:   39 Text Interpretation:  Sinus rhythm No significant change since last  tracing Confirmed by Ashok Cordia  MD, Lennette Bihari ([id number]) on 04/16/2014 8:04:21 PM      MDM   Iv ns. Labs. Ct.  Reviewed nursing notes and prior charts for additional history.   Recheck, no change in neuro exam from prior tonight, however findings are new/changed from patients baseline.  Given abn ct suspicious for new brain met, new neuro symptoms/findings, will admit to med service. Mri is pending.   Will give dose decadron.   Admit.    Lajean Saver, MD 04/16/14 2225

## 2014-04-16 NOTE — H&P (Signed)
[facility] History and Physical  AHSLEY ATTWOOD [name]:[id number] DOB: 1950-09-11 DOA: 04/16/2014  Referring physician: EDP PCP: [name], MD   Chief Complaint: Slurred speech   HPI: Danielle [name] is a 64 y.o. female with extensive history of lung cancer with prior mets to brain s/p gamma knife surgery.  Patient has had lung cancer since 2007 and is a long term survivor.  Patient presents to the ED with altered mental status, confusion, word finding trouble since approximately 1 pm today. Son indicates he had seen her shortly after she got up this morning, and she seemed like her normal self. He indicates later in day, approximately 1 pm, he again talked to her and she seemed confused, and her speech didn't make sense. Those symptoms have persisted since. Pt denies headache. Pt has little insight into current symptoms, stating everything seems normal to her. Pt denies change in speech or vision. No headaches. No numbness or weakness. Denies problems w balance, walking, or coordination. No fever or chills. Compliant w normal meds, denies any recent change in meds. Hx brain met w prior gamma knife procedure x 2.  Review of Systems: Systems reviewed.  As above, otherwise negative  Past Medical History  Diagnosis Date  . Hypertension   . Anemia   . H/O: pneumonia   . History of tobacco abuse quit 10/08    on nicotine patch  . Fibromyalgia   . Hypokalemia   . DJD (degenerative joint disease), cervical   . Thrush 12/11/2010  . 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 had chemo and radiation for lung ca   Past Surgical History  Procedure Laterality Date  . Cervical laminectomy  1995  . Other surgical history  2008    Gamma knife surgery to remove brain met   . 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 Knee surgery  1990    Left x 2  . Portacath placement  -[name]    TIP IN CAVOATRIAL JUNCTION   Social History:   reports that she quit smoking about 8 years ago. She has never used smokeless tobacco. She reports that she does not drink alcohol or use illicit drugs.  Allergies  Allergen Reactions  . Contrast Media [Iodinated Diagnostic Agents] Hives    No reaction provided.  Marland Kitchen Co-Trimoxazole Injection [Sulfamethoxazole-Trimethoprim]   . Iohexol      Code: HIVES, Desc: pt needs 13 hour prep--prev hives; added allergy 06/02/07 slg   . Sulfa Drugs Cross Reactors     Family History  Problem Relation Age of Onset  . Diabetes Father   . Lymphoma Father   . Cancer Father   . Liver cancer Mother   . Lung cancer Mother   . Cancer Mother   . Hyperlipidemia Brother   . Healthy Daughter   . Healthy Son   . Healthy Son   . Cancer Sister 72    ovarian cancer     Prior to Admission medications   Medication Sig Start Date End Date Taking? Authorizing Provider  ALPRAZolam (XANAX) 0.25 MG tablet Take 1 tablet (0.25 mg total) by mouth at bedtime as needed for sleep. 03/22/14  Yes Curt Bears, MD  amLODipine (NORVASC) 10 MG tablet Take 1 tablet (10 mg total) by mouth daily. 05/13/13  Yes Amy Cletis Athens, MD  fluticasone (FLONASE) 50 MCG/ACT nasal spray USE 1-2 SPRAYS IN EACH NOSTRIL DAILY 09/03/13  Yes Amy Cletis Athens, MD  folic acid (FOLVITE) 1 MG tablet TAKE 1 TABLET BY MOUTH DAILY. 02/08/14  Yes Curt Bears, MD  lidocaine-prilocaine (EMLA) cream Apply topically as needed. 05/11/13 05/11/14 Yes Curt Bears, MD  lisinopril (PRINIVIL,ZESTRIL) 20 MG tablet TAKE 2 TABLETS (40 MG TOTAL) BY MOUTH DAILY. Patient taking differently: TAKE 1 TABLET (20MG TOTAL) BY MOUTH DAILY. 12/29/13  Yes Amy Cletis Athens, MD  morphine (MSIR) 30 MG tablet Take 1 tablet (30 mg total) by mouth every 4 (four) hours as needed. 03/22/14  Yes Curt Bears, MD  olopatadine (PATANOL) 0.1 % ophthalmic solution Place 1 drop into both eyes 2 (two) times daily. 08/06/12  Yes [name] Bedsole, MD  ondansetron (ZOFRAN) 8 MG tablet TAKE 1 TABLET BY  MOUTH EVERY 8 HOURS AS NEEDED FOR NAUSEA. 02/18/14  Yes [name] E Johnson, PA-C  simvastatin (ZOCOR) 20 MG tablet Take 1 tablet (20 mg total) by mouth daily. 05/13/13  Yes Amy Cletis Athens, MD   Physical Exam: Filed Vitals:   04/16/14 2024  BP:   Pulse:   Temp: 97.9 F (36.6 C)  Resp:     BP 117/61 mmHg  Pulse 96  Temp(Src) 97.9 F (36.6 C)  Resp 15  SpO2 92%  General Appearance:    Alert, oriented to person and place, not time nor situation, no distress, appears stated age  Head:    Normocephalic, atraumatic  Eyes:    PERRL, EOMI, sclera non-icteric        Nose:   Nares without drainage or epistaxis. Mucosa, turbinates normal  Throat:   Moist mucous membranes. Oropharynx without erythema or exudate.  Neck:   Supple. No carotid bruits.  No thyromegaly.  No lymphadenopathy.   Back:     No CVA tenderness, no spinal tenderness  Lungs:     Clear to auscultation bilaterally, without wheezes, rhonchi or rales  Chest wall:    No tenderness to palpitation  Heart:    Regular rate and rhythm without murmurs, gallops, rubs  Abdomen:     Soft, non-tender, nondistended, normal bowel sounds, no organomegaly  Genitalia:    deferred  Rectal:    deferred  Extremities:   No clubbing, cyanosis or edema.  Pulses:   2+ and symmetric all extremities  Skin:   Skin color, texture, turgor normal, no rashes or lesions  Lymph nodes:   Cervical, supraclavicular, and axillary nodes normal  Neurologic:   No musculoskeletal deficit, but patient has significantly abnormal speech, one word answers, and answers dont seem to make much sense either.    Labs on Admission:  Basic Metabolic Panel:  Recent Labs Lab 04/12/14 0916 04/16/14 2024  NA 140 136  K 4.4 3.8  CL  --  99  CO2 25 26  GLUCOSE 109 132*  BUN 9.1 35*  CREATININE 0.9 1.73*  CALCIUM 9.1 9.3   Liver Function Tests:  Recent Labs Lab 04/12/14 0916 04/16/14 2024  AST 15 24  ALT 9 21  ALKPHOS 76 60  BILITOT 0.29 0.8  PROT 6.6 6.7   ALBUMIN 3.1* 3.7   No results for input(s): LIPASE, AMYLASE in the last 168 hours. No results for input(s): AMMONIA in the last 168 hours. CBC:  Recent Labs Lab 04/12/14 0916 04/16/14 2024  WBC 7.7 11.9*  NEUTROABS 4.2  --   HGB 10.9* 12.0  HCT 33.8* 37.7  MCV 93.4 92.6  PLT 351 447*   Cardiac Enzymes: No results for input(s): CKTOTAL, CKMB, CKMBINDEX, TROPONINI  in the last 168 hours.  BNP (last 3 results) No results for input(s): PROBNP in the last 8760 hours. CBG:  Recent Labs Lab 04/16/14 2002  GLUCAP 121*    Radiological Exams on Admission: Ct Head (brain) Wo Contrast  04/16/2014   CLINICAL DATA:  Altered mental status and slurred speech. Last seen baseline at approximately noon today. Began noticing altered status at 15:00 today. Weakness, slurred speech, unsteady on feet, confusion. History of lung cancer.  EXAM: CT HEAD WITHOUT CONTRAST  TECHNIQUE: Contiguous axial images were obtained from the base of the skull through the vertex without intravenous contrast.  COMPARISON:  MRI brain 10/09/2006  FINDINGS: There is subtle low-attenuation change with loss of gray-white matter distinction in the left frontal lobe. Area of involvement measures about 2.6 x 3.1 cm. Appearance may represent focal ischemia or metastasis. Suggest MRI for further evaluation. Ventricles and sulci are otherwise symmetrical. No ventricular dilatation. No abnormal extra-axial fluid collections. Basal cisterns are not effaced. No acute intracranial hemorrhage. Mucosal thickening throughout the paranasal sinuses with opacification in some of the ethmoid air cells. Mastoid air cells are not opacified. Calvarium appears intact.  IMPRESSION: Subtle area of low attenuation in the left frontal lobe. This could represent focal ischemia or metastasis. Suggest MRI for further evaluation. No acute intracranial hemorrhage. No significant mass effect.   Electronically Signed   By: Lucienne Capers M.D.   On: 04/16/2014  22:09    EKG: Independently reviewed.  Assessment/Plan Principal Problem:   Aphasia Active Problems:   Essential hypertension   Cancer of upper lobe of right lung   Abnormal CT scan of head   AKI (acute kidney injury)   Abnormal CT of the head   1. Aphasia - CT scan of the head reveals what appears to be a new low attenuation area in the left frontal lobe that represents either a new met from her lung CA vs ischemia. 1. Admit to [facility], family refuses transfer to [facility] 2. MRI in AM 3. Decadron empirically as this could very well be a new met from her NSCLC 4. Attempting to get a hold of Dr. Earlie Server at family's request 2. AKI - patients creatinine is up to 1.7 from 0.9 a mere 4 days ago 1. Hold lisinopril 2. Check renal ultrasound 3. Gentle hydration 3. HTN - hold lisinopril due to new AKI    Code Status: Full Code  Family Communication: Family at bedside Disposition Plan: Admit to inpatient   Time spent: 70 min  GARDNER, JARED M. [facility] Pager [phone]  If 7AM-7PM, please contact the day team taking care of the patient [email or web address] Password [email or web address] 04/16/2014, 11:04 PM

## 2014-04-16 NOTE — ED Notes (Signed)
Patient presents for AMS, slurred speech. Per son, last seen at baseline approximately 1200 today, began noticing altered status approximately 1500 today, described as "weakness, slurred speech, unsteady on feet, confusion". Patient denies pain, oriented to self.

## 2014-04-17 ENCOUNTER — Inpatient Hospital Stay (HOSPITAL_COMMUNITY): Payer: Medicare Other

## 2014-04-17 DIAGNOSIS — I639 Cerebral infarction, unspecified: Secondary | ICD-10-CM | POA: Diagnosis present

## 2014-04-17 DIAGNOSIS — C3411 Malignant neoplasm of upper lobe, right bronchus or lung: Secondary | ICD-10-CM

## 2014-04-17 DIAGNOSIS — C7931 Secondary malignant neoplasm of brain: Secondary | ICD-10-CM

## 2014-04-17 DIAGNOSIS — R4701 Aphasia: Secondary | ICD-10-CM

## 2014-04-17 DIAGNOSIS — G9340 Encephalopathy, unspecified: Secondary | ICD-10-CM

## 2014-04-17 DIAGNOSIS — R4781 Slurred speech: Secondary | ICD-10-CM

## 2014-04-17 LAB — URINALYSIS, ROUTINE W REFLEX MICROSCOPIC
Bilirubin Urine: NEGATIVE
GLUCOSE, UA: NEGATIVE mg/dL
Ketones, ur: NEGATIVE mg/dL
NITRITE: NEGATIVE
Protein, ur: NEGATIVE mg/dL
Specific Gravity, Urine: 1.014 (ref 1.005–1.030)
Urobilinogen, UA: 0.2 mg/dL (ref 0.0–1.0)
pH: 5.5 (ref 5.0–8.0)

## 2014-04-17 LAB — CBC
HCT: 37 % (ref 36.0–46.0)
Hemoglobin: 12 g/dL (ref 12.0–15.0)
MCH: 29.9 pg (ref 26.0–34.0)
MCHC: 32.4 g/dL (ref 30.0–36.0)
MCV: 92 fL (ref 78.0–100.0)
Platelets: 407 10*3/uL — ABNORMAL HIGH (ref 150–400)
RBC: 4.02 MIL/uL (ref 3.87–5.11)
RDW: 14.3 % (ref 11.5–15.5)
WBC: 8.6 10*3/uL (ref 4.0–10.5)

## 2014-04-17 LAB — BASIC METABOLIC PANEL WITH GFR
Anion gap: 8 (ref 5–15)
BUN: 33 mg/dL — ABNORMAL HIGH (ref 6–23)
CO2: 27 mmol/L (ref 19–32)
Calcium: 9 mg/dL (ref 8.4–10.5)
Chloride: 102 mmol/L (ref 96–112)
Creatinine, Ser: 1.23 mg/dL — ABNORMAL HIGH (ref 0.50–1.10)
GFR calc Af Amer: 54 mL/min — ABNORMAL LOW
GFR calc non Af Amer: 46 mL/min — ABNORMAL LOW
Glucose, Bld: 170 mg/dL — ABNORMAL HIGH (ref 70–99)
Potassium: 4.1 mmol/L (ref 3.5–5.1)
Sodium: 137 mmol/L (ref 135–145)

## 2014-04-17 LAB — URINE MICROSCOPIC-ADD ON

## 2014-04-17 MED ORDER — SENNOSIDES-DOCUSATE SODIUM 8.6-50 MG PO TABS: 1.0000 | ORAL_TABLET | Freq: Every evening | ORAL | Status: DC | PRN

## 2014-04-17 MED ORDER — DEXAMETHASONE 4 MG PO TABS
4.0000 mg | ORAL_TABLET | Freq: Four times a day (QID) | ORAL | Status: DC
  Administered 2014-04-17 – 2014-04-19 (×8): 4 mg via ORAL
  Filled 2014-04-17 (×16): qty 1

## 2014-04-17 MED ORDER — SODIUM CHLORIDE 0.9 % IJ SOLN
10.0000 mL | INTRAMUSCULAR | Status: DC | PRN
  Administered 2014-04-17: 40 mL
  Administered 2014-04-18: 10 mL
  Administered 2014-04-18: 40 mL
  Administered 2014-04-19: 10 mL
  Filled 2014-04-17 (×3): qty 40

## 2014-04-17 MED ORDER — CEFTRIAXONE SODIUM IN DEXTROSE 20 MG/ML IV SOLN
1.0000 g | INTRAVENOUS | Status: DC
  Administered 2014-04-18 (×2): 1 g via INTRAVENOUS
  Filled 2014-04-17 (×4): qty 50

## 2014-04-17 MED ORDER — STROKE: EARLY STAGES OF RECOVERY BOOK
Freq: Once | Status: AC
  Administered 2014-04-18: 01:00:00
  Filled 2014-04-17: qty 1

## 2014-04-17 MED ORDER — SODIUM CHLORIDE 0.9 % IJ SOLN
INTRAMUSCULAR | Status: AC
  Filled 2014-04-17: qty 10

## 2014-04-17 MED ORDER — MORPHINE SULFATE ER 60 MG PO TBCR
60.0000 mg | EXTENDED_RELEASE_TABLET | Freq: Two times a day (BID) | ORAL | Status: DC
  Administered 2014-04-17 – 2014-04-19 (×4): 60 mg via ORAL
  Filled 2014-04-17 (×4): qty 1

## 2014-04-17 MED ORDER — GADOBENATE DIMEGLUMINE 529 MG/ML IV SOLN
15.0000 mL | Freq: Once | INTRAVENOUS | Status: AC | PRN
  Administered 2014-04-17: 14 mL via INTRAVENOUS

## 2014-04-17 MED ORDER — ASPIRIN EC 325 MG PO TBEC
325.0000 mg | DELAYED_RELEASE_TABLET | Freq: Every day | ORAL | Status: DC
  Administered 2014-04-17 – 2014-04-19 (×3): 325 mg via ORAL
  Filled 2014-04-17 (×3): qty 1

## 2014-04-17 NOTE — Progress Notes (Signed)
Diagnosis:  Metastatic non-small cell lung cancer initially diagnosed with locally advanced stage IIIB with right Pancoast tumor involving the vertebral body as well as the foraminal canal invasion with spinal cord compression in October of 2007.   Prior Therapy:  1. Status post concurrent chemoradiation with weekly carboplatin and paclitaxel, last dose was given November 18, 2005. 2. 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 3. Status post gamma knife radiotherapy to a solitary brain lesion located in the superior frontal area of the brain at [facility] in April of 2008. 4. Status post palliative radiotherapy to the lateral abdominal wall metastatic lesion under the care of Dr. Lisbeth Renshaw, completed March of 2009. 5. Status post 6 cycles of systemic chemotherapy with carboplatin and Alimta. Last dose was given July 26, 2007 with disease stabilization. 6. Gamma knife stereotactic radiotherapy to 2 brain lesions one involving the right frontal dural based as well as right parietal lesion performed on 05/07/2012 under the care of Dr. Vallarie Mare at [facility].  Current Therapy:  Maintenance chemotherapy with Alimta 500 mg per meter squared given every 3 weeks. The patient is status post 115 cycles.   CHEMOTHERAPY INTENT: Palliative/maintenance  CURRENT # OF CHEMOTHERAPY CYCLES: 115 CURRENT ANTIEMETICS: Zofran, dexamethasone and Compazine  CURRENT SMOKING STATUS: Quit smoking 10/07/2005  ORAL CHEMOTHERAPY AND CONSENT: None  CURRENT BISPHOSPHONATES USE: None  PAIN MANAGEMENT: 0/10 currently on morphine  NARCOTICS INDUCED CONSTIPATION: No constipation  LIVING WILL AND CODE STATUS: No CODE BLUE  Subjective: The patient is seen and examined today. Her sister and son were at the bedside. She was admitted last night in the emergency Department with altered mental status and slurred speech as well as confusion  and trouble finding words started yesterday afternoon. CT scan of the head performed yesterday at the emergency Department showed subtle area of attenuation in the left frontal lobe questionable for focal ischemia or metastasis. The CT scan of the head was compared to previous scan in 2008. The patient had several studies performed at [facility] and last MRI of the brain was performed 5 months ago. MRI of the brain was ordered but not done yet. The patient was started on Decadron 10 mg IV 1. She is feeling a little bit better today but continues to have some confusion. She denied having any significant nausea or vomiting, no chest pain, shortness of breath, cough or hemoptysis.  Objective: Vital signs in last 24 hours: Temp:  [97.9 F (36.6 C)-98.4 F (36.9 C)] 98.4 F (36.9 C) (04/10 0513) Pulse Rate:  [79-96] 88 (04/10 0513) Resp:  [15-16] 16 (04/10 0513) BP: (102-122)/(59-62) 122/62 mmHg (04/10 0513) SpO2:  [92 %-99 %] 95 % (04/10 0513) Weight:  [151 lb 7.3 oz (68.7 kg)] 151 lb 7.3 oz (68.7 kg) (04/10 0003)  Intake/Output from previous day:   Intake/Output this shift:    General appearance: alert, cooperative and no distress Resp: clear to auscultation bilaterally Cardio: regular rate and rhythm, S1, S2 normal, no murmur, click, rub or gallop GI: soft, non-tender; bowel sounds normal; no masses,  no organomegaly Extremities: extremities normal, atraumatic, no cyanosis or edema  Lab Results:   Recent Labs  04/16/14 2024 04/17/14 0455  WBC 11.9* 8.6  HGB 12.0 12.0  HCT 37.7 37.0  PLT 447* 407*   BMET  Recent Labs  04/16/14 2024 04/17/14 0455  NA 136 137  K 3.8 4.1  CL 99 102  CO2 26 27  GLUCOSE 132* 170*  BUN 35* 33*  CREATININE 1.73* 1.23*  CALCIUM 9.3 9.0    Studies/Results: Ct Head (brain) Wo Contrast  04/16/2014   CLINICAL DATA:  Altered mental status and slurred speech. Last seen baseline at approximately noon today. Began noticing altered  status at 15:00 today. Weakness, slurred speech, unsteady on feet, confusion. History of lung cancer.  EXAM: CT HEAD WITHOUT CONTRAST  TECHNIQUE: Contiguous axial images were obtained from the base of the skull through the vertex without intravenous contrast.  COMPARISON:  MRI brain 10/09/2006  FINDINGS: There is subtle low-attenuation change with loss of gray-white matter distinction in the left frontal lobe. Area of involvement measures about 2.6 x 3.1 cm. Appearance may represent focal ischemia or metastasis. Suggest MRI for further evaluation. Ventricles and sulci are otherwise symmetrical. No ventricular dilatation. No abnormal extra-axial fluid collections. Basal cisterns are not effaced. No acute intracranial hemorrhage. Mucosal thickening throughout the paranasal sinuses with opacification in some of the ethmoid air cells. Mastoid air cells are not opacified. Calvarium appears intact.  IMPRESSION: Subtle area of low attenuation in the left frontal lobe. This could represent focal ischemia or metastasis. Suggest MRI for further evaluation. No acute intracranial hemorrhage. No significant mass effect.   Electronically Signed   By: Lucienne Capers M.D.   On: 04/16/2014 22:09    Medications: I have reviewed the patient's current medications.  Assessment/Plan: 1) metastatic non-small cell lung cancer, with known metastatic brain tumor status post a stereotactic radiotherapy performed at [facility] and the patient was followed by Dr. Bridgett Larsson, radiation oncologist at [facility] and her last MRI of the brain performed 5 months ago was unremarkable for any disease progression. She is currently on systemic chemotherapy with maintenance Alimta status post 115 cycles and tolerating her treatment fairly well with no evidence for disease progression.  2) mental status change: Questionable new metastatic brain lesion versus stroke. The patient will need MRI of the brain for further evaluation of this lesion  and this needs to be compared to her most recent MRI of the brain performed at [facility] few months ago. We will continue the patient on Decadron 4 mg by mouth every 6 hours to help with vasogenic edema. I ask the patient to contact Dr. Bridgett Larsson for evaluation soon regarding this findings on her recent brain scan.  Thank you so much for taking good care of Ms. Knies, I will continue to follow up the patient with you and assist in her management on as-needed basis.   LOS: 1 day    Byron Tipping K. 04/17/2014

## 2014-04-17 NOTE — Progress Notes (Signed)
Shift event/update:  Notified by RN that pt now w/ urinary frequency and dysuria. Antibiotics initially held for positive u/a d/t pt asymptomatic. As she is now symptomatic will add Rocephin 1 GM IV q24h for UTI.   Jeryl Columbia, NP-C [facility] Pager [phone]

## 2014-04-17 NOTE — Progress Notes (Signed)
Dr. Genia Del called to report results Of MRI- mod.size acute hemorrhagic anterior left frontal lobe infarct with extension to anterior Left peri operculum region and subinsular region. Dr Wynelle Cleveland notified orders given.   Pt to be transferred to [facility] via [place]. I called [facility] to give report and they were unable to take report, Charge nurse will call me back.

## 2014-04-17 NOTE — Progress Notes (Addendum)
[facility] Progress Note   KAELEE PFEFFER RSW:[id number] DOB: [birth year] DOA: 04/16/2014 PCP: [name], MD  Brief narrative: Danielle [name] is a 64 y.o. female with history of lung cancer diagnosed in 2007 with brain metastases status post Gamma knife surgery in 2014. On chemotherapy with Dr. Earlie Server since 2007. Currently on systemic chemotherapy with all team [place] she has been tolerating well. The patient presents with confusion and slurred speech starting on the morning of 4/9. She was found to have some changes on her CT of the head and therefore was admitted to the hospital. An MRI was ordered.  Subjective: Evaluated this morning-asked her how she was feeling and she did not have much to say-son at bedside feels may be her slurred speech and confusion had improved a little bit. I discussed the fact that she had a positive UA and she denied having dysuria or increased frequency of micturition or symptoms she usually gets with a UTI.  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 Leonel Ramsay who has reviewed the films). She also has small acute scattered right parietal lobe infarcts. Dr. Leonel Ramsay suspects an embolic stroke. -His recommendations are to transfer to [facility] obtain a 2-D echo carotid Dopplers-he suspects she will likely need a TEE-for now he recommends a telemetry bed, full dose baby aspirin,  hold subcutaneous heparin and use SCDs instead-he will see the patient when she arrives at [facility] -Have notified the patient  and son in person-the patient appeared to become increasingly confused after this information was given-the son took me out of the room and states that he would like Dr. Earlie Server to know immediately as he is the only person that the patient listens to-have contacted Dr. Earlie Server and made him aware - discontinue Decadron - SLP eval  Active Problems:   Essential hypertension -Repeat currently normal with systolic in 093G- we'll hold Norvasc to allow for permissive hypertension    Cancer of upper lobe of right lung -Management by oncology    AKI (acute kidney injury) -Possibly prerenal - Continue slow IV hydration and follow -Renal ultrasound ordered on admission  Positive UA/pyuria -The patient is asymptomatic  Code Status: Full code Family Communication: With son Disposition Plan: Further workup for CVA  DVT prophylaxis: SCDs-discontinued heparin today Consultants: Neurology, oncology Procedures: None  Antibiotics: Anti-infectives    None      Objective: Filed Weights   04/17/14 0003  Weight: 68.7 kg (151 lb 7.3 oz)   No intake or output data in the 24 hours ending 04/17/14 1250   Vitals Filed Vitals:   04/16/14 2024 04/17/14 0003 04/17/14 0513 04/17/14 1110  BP:  102/59 122/62 126/67  Pulse:  79 88   Temp: 97.9 F (36.6 C) 98.2 F (36.8 C) 98.4 F (36.9 C)   TempSrc:  Oral Oral   Resp:  16 16   Height:  5\' 3"  (1.6 m)    Weight:  68.7 kg (151 lb 7.3 oz)    SpO2:  99% 95%     Exam:  General:  Pt is alert-became  quite confused and began to talk without making any sense after I notified her of her CVA  HEENT: No icterus, No thrush  Cardiovascular: regular rate and rhythm, S1/S2 No murmur  Respiratory: clear to auscultation bilaterally   Abdomen: Soft, +Bowel sounds, non tender, non distended, no guarding  MSK: No LE edema, cyanosis or clubbing  Data Reviewed: Basic Metabolic Panel:  Recent Labs Lab 04/12/14 0916 04/16/14 2024 04/17/14 0455  NA  140 136 137  K 4.4 3.8 4.1  CL  --  99 102  CO2 25 26 27   GLUCOSE 109 132* 170*  BUN 9.1 35* 33*  CREATININE 0.9 1.73* 1.23*  CALCIUM 9.1 9.3 9.0   Liver Function Tests:  Recent Labs Lab 04/12/14 0916 04/16/14 2024  AST 15 24  ALT 9 21  ALKPHOS 76 60  BILITOT 0.29 0.8  PROT 6.6 6.7  ALBUMIN 3.1* 3.7   No results for input(s): LIPASE, AMYLASE in the last 168 hours. No results for input(s): AMMONIA in the last 168 hours. CBC:  Recent Labs Lab 04/12/14 0916 04/16/14 2024 04/17/14 0455  WBC 7.7 11.9* 8.6  NEUTROABS 4.2  --   --   HGB 10.9* 12.0 12.0  HCT 33.8* 37.7 37.0  MCV 93.4 92.6 92.0  PLT 351 447* 407*   Cardiac Enzymes: No results for input(s): CKTOTAL, CKMB, CKMBINDEX, TROPONINI in the last 168 hours. BNP (last 3 results) No results for input(s): BNP in the last 8760 hours.  ProBNP (last 3 results) No results for input(s): PROBNP in the last 8760 hours.  CBG:  Recent Labs Lab 04/16/14 2002  GLUCAP 121*    No results found for this or any previous visit (from the past 240 hour(s)).   Studies:  Recent x-ray studies have been reviewed in detail by the Attending Physician  Scheduled Meds:  Scheduled Meds: . amLODipine  10 mg Oral Daily  . aspirin EC  325 mg Oral Daily  . dexamethasone  4 mg Oral 4 times per day  . olopatadine  1 drop Both Eyes BID  . simvastatin  20 mg Oral Daily  . sodium chloride       Continuous Infusions: . sodium chloride 75 mL/hr at 04/16/14 2339    Time spent on care of this patient: 45 minutes [place], MD 04/17/2014, 12:50 PM  LOS: 1 day   [facility] Office  [phone] Pager - Text Page per [email or web address]  If 7PM-7AM, please contact night-coverage [email or web address]

## 2014-04-17 NOTE — Consult Note (Signed)
Referring Physician: Dr Wynelle Cleveland    Chief Complaint: Confusion with speech difficulties  HPI: Danielle [name] is an unfortunate 64 y.o. female with a history of lung cancer with brain metastasis status post gamma knife procedures, hypertension, previous tobacco history, and fibromyalgia who was visiting her daughter yesterday when it was noted that the patient's speech was becoming slurred and she seemed to be using incorrect words while trying to communicate. Later she appeared to become confused. The patient was brought to the emergency department at [facility] last evening and subsequently transferred to [facility]. 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 According to the family the patient is doing much better today. She is less confused and having less difficulties with her speech but she still is not back to baseline. Much of the history was obtained from her sister who apparently has healthcare power of attorney; however, she has not been able to locate that documentation. There is been some question regarding the patient's CODE STATUS. Neurology has been asked to consult regarding the patient's stroke.  Date last known well: Date: 04/16/2014 Time last known well: Time: 15:00 tPA Given: No: Late presentation, hemorrhagic component, and brain metastases.  Past Medical History  Diagnosis Date  . Hypertension   . Anemia   . H/O: pneumonia   . History of tobacco abuse quit 10/08    on nicotine patch  . Fibromyalgia   . Hypokalemia   . DJD (degenerative joint disease), cervical   . Thrush 12/11/2010  . 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 had chemo and radiation for lung ca    Past  Surgical History  Procedure Laterality Date  . Cervical laminectomy  1995  . Other surgical history  2008    Gamma knife surgery to remove brain met   . 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 Knee surgery  1990    Left x 2  . Portacath placement  -[name]    TIP IN CAVOATRIAL JUNCTION    Family History  Problem Relation Age of Onset  . Diabetes Father   . Lymphoma Father   . Cancer Father   . Liver cancer Mother   . Lung cancer Mother   . Cancer Mother   . Hyperlipidemia Brother   . Healthy Daughter   . Healthy Son   . Healthy Son   . Cancer Sister 55    ovarian cancer   The patient's father died from a stroke in his 9s   Social History:  reports that she quit smoking about 8 years ago. She has never used smokeless tobacco. She reports that she does not drink alcohol or use illicit drugs.  Allergies:  Allergies  Allergen Reactions  . Contrast Media [Iodinated Diagnostic Agents] Hives    No reaction provided.  Marland Kitchen Co-Trimoxazole Injection [Sulfamethoxazole-Trimethoprim]   . Iohexol      Code: HIVES, Desc: pt needs 13 hour prep--prev hives; added allergy 06/02/07 slg   . Sulfa Drugs Cross Reactors     Medications:  Scheduled: . amLODipine  10 mg Oral Daily  . aspirin EC  325 mg Oral Daily  . dexamethasone  4 mg Oral 4 times per day  . olopatadine  1 drop Both Eyes BID  . simvastatin  20 mg Oral Daily  . sodium chloride        ROS: History obtained from the patient and her sister  General ROS: negative for - chills, fatigue, fever, weight gain or weight loss. Positive for episodes of diaphoresis. Psychological ROS: negative for - behavioral disorder, hallucinations, memory difficulties, mood swings or suicidal ideation Ophthalmic ROS: negative for - blurry vision, double vision, eye pain or loss of vision ENT ROS: negative for - epistaxis, nasal discharge, oral lesions, sore throat, tinnitus or vertigo Allergy and Immunology ROS:  negative for - hives or itchy/watery eyes Hematological and Lymphatic ROS: negative for - bleeding problems, bruising or swollen lymph nodes Endocrine ROS: negative for - galactorrhea, hair pattern changes, polydipsia/polyuria or temperature intolerance Respiratory ROS: negative for - cough, hemoptysis, shortness of breath or wheezing Cardiovascular ROS: negative for - chest pain, dyspnea on exertion, edema or irregular heartbeat Gastrointestinal ROS: negative for - abdominal pain, diarrhea, hematemesis, or stool incontinence. N&V 2/2 chemotherapy Genito-Urinary ROS: negative for - dysuria, hematuria, incontinence or urinary frequency/urgency Musculoskeletal ROS: negative for - joint swelling or muscular weakness Neurological ROS: as noted in HPI Dermatological ROS: negative for rash and skin lesion changes   Physical Examination: Blood pressure 126/67, pulse 88, temperature 98.4 F (36.9 C), temperature source Oral, resp. rate 16, height '5\' 3"'  (1.6 m), weight 68.7 kg (151 lb 7.3 oz), SpO2 95 %.  General - pleasant alert 64 year old female who appears in no acute distress Heart - Regular rate and rhythm - no murmer Lungs - Clear to auscultation Abdomen - Soft - non tender Extremities - Distal pulses intact - no edema Skin - Warm and dry  Mental Status: Alert - answers most questions appropriately but is not oriented and unable to perform even the simplest calculations. Speech clear but with word finding difficulties and at times nonsensical speech.  Able to follow most simple commands without difficulty. Cranial Nerves: II: Discs not visualized; Visual fields grossly normal. Both pupils are reactive however the left pupil is slightly larger than the right. 67m vs 362mIII,IV, VI: ptosis not present, extra-ocular motions intact bilaterally V,VII: smile symmetric, facial light touch sensation normal bilaterally VIII: hearing normal bilaterally IX,X: gag reflex present XI: bilateral  shoulder shrug XII: midline tongue extension Motor: Right : Upper extremity   5/5    Left:     Upper extremity   5/5  Lower extremity   5/5     Lower extremity   5/5 Tone and bulk:normal tone throughout; no atrophy noted Sensory:  Light touch intact throughout, bilaterally Deep Tendon Reflexes: 2+ and symmetric throughout Plantars: Right: downgoing   Left: downgoing Cerebellar: normal finger-to-nose, normal rapid alternating movements. Gait: Not tested CV: pulses palpable throughout   Laboratory Studies:  Basic Metabolic Panel:  Recent Labs Lab 04/12/14 0916 04/16/14 2024 04/17/14 0455  NA 140 136 137  K 4.4 3.8 4.1  CL  --  99 102  CO2 '25 26 27  ' GLUCOSE 109 132* 170*  BUN 9.1 35* 33*  CREATININE 0.9 1.73* 1.23*  CALCIUM 9.1 9.3 9.0    Liver Function Tests:  Recent Labs Lab 04/12/14 0916 04/16/14 2024  AST 15 24  ALT 9 21  ALKPHOS 76 60  BILITOT 0.29 0.8  PROT 6.6 6.7  ALBUMIN 3.1* 3.7   No results for input(s): LIPASE, AMYLASE in the last 168 hours. No  results for input(s): AMMONIA in the last 168 hours.  CBC:  Recent Labs Lab 04/12/14 0916 04/16/14 2024 04/17/14 0455  WBC 7.7 11.9* 8.6  NEUTROABS 4.2  --   --   HGB 10.9* 12.0 12.0  HCT 33.8* 37.7 37.0  MCV 93.4 92.6 92.0  PLT 351 447* 407*    Cardiac Enzymes: No results for input(s): CKTOTAL, CKMB, CKMBINDEX, TROPONINI in the last 168 hours.  BNP: Invalid input(s): POCBNP  CBG:  Recent Labs Lab 04/16/14 2002  GLUCAP 121*    Microbiology: Results for orders placed or performed in visit on 02/16/13  TECHNOLOGIST REVIEW     Status: None   Collection Time: 02/16/13  8:11 AM  Result Value Ref Range Status   Technologist Review Rare Blast, few Variant lymphs  Final    Coagulation Studies: No results for input(s): LABPROT, INR in the last 72 hours.  Urinalysis:  Recent Labs Lab 04/17/14 0019  COLORURINE YELLOW  LABSPEC 1.014  PHURINE 5.5  GLUCOSEU NEGATIVE  HGBUR TRACE*   BILIRUBINUR NEGATIVE  KETONESUR NEGATIVE  PROTEINUR NEGATIVE  UROBILINOGEN 0.2  NITRITE NEGATIVE  LEUKOCYTESUR SMALL*    Lipid Panel:    Component Value Date/Time   CHOL 148 11/12/2013 0803   TRIG 134.0 11/12/2013 0803   HDL 29.20* 11/12/2013 0803   CHOLHDL 5 11/12/2013 0803   VLDL 26.8 11/12/2013 0803   LDLCALC 92 11/12/2013 0803    HgbA1C:  Lab Results  Component Value Date   HGBA1C 5.8 11/12/2013    Urine Drug Screen:  No results found for: LABOPIA, COCAINSCRNUR, LABBENZ, AMPHETMU, THCU, LABBARB  Alcohol Level: No results for input(s): ETH in the last 168 hours.  Other results: EKG: Sinus rhythm rate 95 bpm.  Imaging:  Ct Head (brain) Wo Contrast 04/16/2014    Subtle area of low attenuation in the left frontal lobe. This could represent focal ischemia or metastasis. Suggest MRI for further evaluation. No acute intracranial hemorrhage. No significant mass effect.      Mr Jeri Cos Wo Contrast 04/17/2014    This exam was interpreted during a PACS downtime with limited availability of comparison cases. It has been flagged for review following the downtime. If clinically indicated after this review, an addendum will be issued providing details about comparison to prior imaging.    Moderate-size acute hemorrhagic anterior left frontal lobe infarct with extension to the anterior left peri operculum region and subinsular region. Mild local mass effect.   Small acute scattered right parietal lobe infarcts.   Remote small right lenticular nucleus infarct.   No intracranial enhancing lesion or bony destructive lesion to suggest the presence of intracranial metastatic disease.  Paranasal sinus mucosal thickening/ partial opacification most notable maxillary sinuses, ethmoid sinus air cells and right sphenoid sinus.      Mikey Bussing PA-C [facility] Pager [phone] 04/17/2014, 6:09 PM    Assessment: 64 y.o. female with lung cancer and brain metastasis  who developed mild confusion and speech difficulties with somewhat gradual onset yesterday. Significantly improved today per family history but still with word finding difficulties, nonsensical speech at times, and mild confusion.   Stroke Risk Factors - family history, hypercoagulable state and smoking  Possibilities include cardioembolic vs hypercoagulable state 2/2 cancer.   Plan:  1. HgbA1c, fasting lipid panel 2. Frequent neuro checks 3. Echocardiogram 4. Carotid dopplers 5. Prophylactic therapy-Antiplatelet med: Aspirin - dose 358m PO or 3069mPR 6. Risk factor modification 7. Telemetry monitoring 8. PT consult, OT consult,  Speech consult   Roland Rack, MD [facility] [phone]  If 7pm- 7am, please page neurology on call as listed in [place].

## 2014-04-17 NOTE — Progress Notes (Signed)
Chaplain referred to patient's room after she voiced regret that she missed church services. Chaplain prayed with and for her, voicing her requested to be healed of that which she can be healed and the bravery to face that which cannot be healed  Sallee Lange. Lezette Kitts, [place]

## 2014-04-17 NOTE — Plan of Care (Signed)
Problem: Phase I Progression Outcomes Goal: Pain controlled with appropriate interventions Outcome: Completed/Met Date Met:  04/17/14 Denies pain

## 2014-04-17 NOTE — Plan of Care (Signed)
Problem: Phase I Progression Outcomes Goal: OOB as tolerated unless otherwise ordered Outcome: Progressing Ambulates to the bathroom with standby assist

## 2014-04-17 NOTE — Progress Notes (Signed)
Utilization review completed.  

## 2014-04-17 NOTE — Progress Notes (Signed)
[facility] here to pick pt up, it was noted the pt 's Lt. Pupil was larger than Rt.  This is a change from AM. Danielle [name] from [facility] given report.

## 2014-04-17 NOTE — Progress Notes (Addendum)
Called Dr Wynelle Cleveland, 4N charge and pt placement to determine if pt should wait for a soon-available 4N bed (as ordered), or proceed with transfer to 5N. Attempting to call for report, but not getting any answer at [facility] phone numbers.

## 2014-04-18 ENCOUNTER — Inpatient Hospital Stay (HOSPITAL_COMMUNITY): Payer: Medicare Other

## 2014-04-18 ENCOUNTER — Telehealth: Payer: Self-pay | Admitting: *Deleted

## 2014-04-18 DIAGNOSIS — R4701 Aphasia: Secondary | ICD-10-CM

## 2014-04-18 DIAGNOSIS — I6340 Cerebral infarction due to embolism of unspecified cerebral artery: Secondary | ICD-10-CM

## 2014-04-18 DIAGNOSIS — N3000 Acute cystitis without hematuria: Secondary | ICD-10-CM

## 2014-04-18 DIAGNOSIS — E785 Hyperlipidemia, unspecified: Secondary | ICD-10-CM

## 2014-04-18 DIAGNOSIS — I6789 Other cerebrovascular disease: Secondary | ICD-10-CM

## 2014-04-18 LAB — LIPID PANEL
CHOL/HDL RATIO: 3.8 ratio
Cholesterol: 173 mg/dL (ref 0–200)
HDL: 45 mg/dL (ref >39)
LDL Cholesterol: 105 mg/dL — ABNORMAL HIGH (ref 0–99)
Triglycerides: 116 mg/dL (ref <150)
VLDL: 23 mg/dL (ref 0–40)

## 2014-04-18 LAB — BASIC METABOLIC PANEL
Anion gap: 11 (ref 5–15)
BUN: 19 mg/dL (ref 6–23)
CHLORIDE: 105 mmol/L (ref 96–112)
CO2: 24 mmol/L (ref 19–32)
Calcium: 9.4 mg/dL (ref 8.4–10.5)
Creatinine, Ser: 0.75 mg/dL (ref 0.50–1.10)
GFR calc Af Amer: >90 mL/min (ref >90)
GFR, EST NON AFRICAN AMERICAN: 90 mL/min — AB (ref >90)
GLUCOSE: 178 mg/dL — AB (ref 70–99)
Potassium: 3.7 mmol/L (ref 3.5–5.1)
SODIUM: 140 mmol/L (ref 135–145)

## 2014-04-18 MED ORDER — ALTEPLASE 2 MG IJ SOLR
2.0000 mg | Freq: Once | INTRAMUSCULAR | Status: AC
  Administered 2014-04-18: 2 mg
  Filled 2014-04-18: qty 2

## 2014-04-18 MED ORDER — ATORVASTATIN CALCIUM 20 MG PO TABS
20.0000 mg | ORAL_TABLET | Freq: Every day | ORAL | Status: DC
  Filled 2014-04-18: qty 1

## 2014-04-18 MED ORDER — SODIUM CHLORIDE 0.9 % IV SOLN
INTRAVENOUS | Status: DC
  Administered 2014-04-18: 22:00:00 via INTRAVENOUS
  Administered 2014-04-19: 500 mL via INTRAVENOUS

## 2014-04-18 MED ORDER — LACOSAMIDE 50 MG PO TABS
50.0000 mg | ORAL_TABLET | Freq: Two times a day (BID) | ORAL | Status: DC
  Administered 2014-04-18 – 2014-04-19 (×2): 50 mg via ORAL
  Filled 2014-04-18 (×2): qty 1

## 2014-04-18 MED ORDER — SIMVASTATIN 40 MG PO TABS: 40.0000 mg | ORAL_TABLET | Freq: Every day | ORAL | Status: DC

## 2014-04-18 NOTE — Evaluation (Signed)
Clinical/Bedside Swallow Evaluation Patient Details  Name: Danielle [name] MRN: [id number] Date of Birth: 1950/09/12  Today's Date: 04/18/2014 Time: SLP Start Time (ACUTE ONLY): 80 SLP Stop Time (ACUTE ONLY): 1052 SLP Time Calculation (min) (ACUTE ONLY): 22 min  Past Medical History:  Past Medical History  Diagnosis Date  . Hypertension   . Anemia   . H/O: pneumonia   . History of tobacco abuse quit 10/08    on nicotine patch  . Fibromyalgia   . Hypokalemia   . DJD (degenerative joint disease), cervical   . Thrush 12/11/2010  . 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 had chemo and radiation for lung ca   Past Surgical History:  Past Surgical History  Procedure Laterality Date  . Cervical laminectomy  1995  . Other surgical history  2008    Gamma knife surgery to remove brain met   . 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 Knee surgery  1990    Left x 2  . Portacath placement  -[name]    TIP IN CAVOATRIAL JUNCTION   HPI:  Danielle [name] is an unfortunate 64 y.o. female with a history of lung cancer with brain metastasis status post gamma knife procedures, hypertension, previous tobacco history, and fibromyalgia who was visiting her daughter when it was noted that the patient's speech was becoming slurred and she seemed to be using incorrect words while trying to communicate.  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   Assessment / Plan / Recommendation Clinical Impression   Pt was seen for swallowing evaluation. Pt was directly observed with thin liquids, puree and solid consistencies with no overt s/s of aspiration. Recommend pt continue regular/thin liquid diet. No follow-up needed. Speech will sign-off.      Aspiration Risk  Mild    Diet Recommendation Regular;Thin liquid   Liquid Administration via: Cup;Straw Medication Administration: Whole meds with liquid Supervision: Patient able to self feed Compensations: Slow rate;Small sips/bites Postural Changes and/or Swallow Maneuvers: Seated upright 90 degrees    Other  Recommendations Oral Care Recommendations: Oral care BID   Follow Up Recommendations  None    Frequency and Duration        Pertinent Vitals/Pain     SLP Swallow Goals     Swallow Study Prior Functional Status       General HPI: Danielle [name] is an unfortunate 64 y.o. female with a history of lung cancer with brain metastasis status post gamma knife procedures, hypertension, previous tobacco history, and fibromyalgia who was visiting her daughter when it was noted that the patient's speech was becoming slurred and she seemed to be using incorrect words while trying to communicate.  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 Type of Study: Bedside swallow evaluation Diet Prior to this Study: Regular;Thin liquids Temperature Spikes Noted: No Respiratory Status: Room air History of Recent Intubation: No Oral Cavity - Dentition: Adequate natural dentition Self-Feeding Abilities: Able to feed self Patient Positioning: Upright in bed Baseline Vocal Quality: Clear    Oral/Motor/Sensory Function Overall Oral Motor/Sensory  Function: Appears within functional limits for tasks assessed Labial ROM: Within Functional Limits Labial Symmetry: Within Functional Limits Lingual ROM: Within Functional Limits Lingual Symmetry: Within Functional Limits Facial ROM: Within Functional Limits Facial Symmetry: Within Functional Limits   Ice Chips Ice chips: Not tested   Thin Liquid  Thin Liquid: Within functional limits    Nectar Thick Nectar Thick Liquid: Not tested   Honey Thick Honey Thick Liquid: Not tested   Puree Puree: Within functional limits   Solid   GO    Solid: Within functional limits       Bermudez-Bosch, Tenea Sens 04/18/2014,11:24 AM

## 2014-04-18 NOTE — Progress Notes (Signed)
  Echocardiogram 2D Echocardiogram has been performed.  Darlina Sicilian M 04/18/2014, 9:51 AM

## 2014-04-18 NOTE — Progress Notes (Signed)
VASCULAR LAB PRELIMINARY  PRELIMINARY  PRELIMINARY  PRELIMINARY  Bilateral Upper and Lower Venous Duplex completed.    Preliminary report:  Negative DVT Upper and Lower Venous Duplex negative DVT bilaterally.  August Albino, RVT 04/18/2014, 2:40 PM

## 2014-04-18 NOTE — Progress Notes (Signed)
PATIENT DETAILS Name: Danielle [name] Age: 64 y.o. Sex: female Date of Birth: Sep 03, 1950 Admit Date: 04/16/2014 Admitting Physician Etta Quill, DO PCP:Amy Diona Browner, MD  Subjective: Has some mild difficulty with word finding-but speech is clear. Complained of dysuria last night.  Assessment/Plan: Principal Problem:   Acute CVA (cerebrovascular accident): Admitted with slurred speech, MRI brain confirmed acute CVA in the left frontal lobe and acute CVA in the right parietal lobe. Continues to have nonsensical speech at times. Transthoracic echocardiogram and carotid Doppler pending. However suspect will require a TEE. Continue aspirin, await further recommendations from the stroke team.LDL not at goal at 105-will increase Zocor to 40 mg, await A1c.   Active Problems:   Essential hypertension: Relatively well controlled with amlodipine. Lisinopril on hold. Follow and titrate accordingly.    Dyslipidemia: See above    UTI: Continue Rocephin, will plan a three-day course. Complains of mild dysuria this morning.    Acute renal failure: Resolved he did continue to hold lisinopril. Suspect etiology was pre-renal azotemia. Renal ultrasound was negative for hydronephrosis.    History of metastatic non-small cell lung cancer, with known metastatic brain tumor status post a stereotactic radiotherapy performed at [facility] 2014: Was started on Decadron on admission-as CT scan done on admission showed possible metastases versus infarct. Suspect we could now discontinue Decadron as MRI confirms infarct. Follows with Dr. Julien Nordmann as an outpatient.  Disposition: Remain inpatient  Antibiotics: See below   Anti-infectives    Start     Dose/Rate Route Frequency Ordered Stop   04/17/14 2200  cefTRIAXone (ROCEPHIN) 1 g in dextrose 5 % 50 mL IVPB - Premix     1 g 100 mL/hr over 30 Minutes Intravenous Every 24 hours 04/17/14 2129        DVT Prophylaxis:  SCD's  Code  Status: Full code  Family Communication Sister at bedside  Procedures: None   CONSULTS:  hematology/oncology and neurology  Time spent 20 minutes-which includes 50% of the time with face-to-face with patient/ family and coordinating care related to the above assessment and plan.  MEDICATIONS: Scheduled Meds: . alteplase  2 mg Intracatheter Once  . amLODipine  10 mg Oral Daily  . aspirin EC  325 mg Oral Daily  . cefTRIAXone (ROCEPHIN)  IV  1 g Intravenous Q24H  . dexamethasone  4 mg Oral 4 times per day  . morphine  60 mg Oral Q12H  . olopatadine  1 drop Both Eyes BID  . simvastatin  20 mg Oral Daily   Continuous Infusions: . sodium chloride 75 mL/hr at 04/16/14 2339   PRN Meds:.ALPRAZolam, morphine, senna-docusate, sodium chloride    PHYSICAL EXAM: Vital signs in last 24 hours: Filed Vitals:   04/17/14 2200 04/18/14 0145 04/18/14 0529 04/18/14 0958  BP: 140/80 133/61 145/70 146/78  Pulse: 88 69 77 75  Temp: 98.2 F (36.8 C) 98.3 F (36.8 C) 97.9 F (36.6 C) 98.1 F (36.7 C)  TempSrc: Oral Oral Oral Oral  Resp: 18 18 18 18   Height:      Weight:      SpO2: 98% 98% 100% 100%    Weight change:  Filed Weights   04/17/14 0003  Weight: 68.7 kg (151 lb 7.3 oz)   Body mass index is 26.84 kg/(m^2).   Gen Exam: Awake and alert with clear but nonsensical speech (at times) Neck: Supple, No JVD.   Chest: B/L Clear.   CVS: S1 S2  Regular, no murmurs.  Abdomen: soft, BS +, non tender, non distended.  Extremities: no edema, lower extremities warm to touch. Neurologic: Non Focal.   Skin: No Rash.   Wounds: N/A.    Intake/Output from previous day:  Intake/Output Summary (Last 24 hours) at 04/18/14 1052 Last data filed at 04/18/14 0957  Gross per 24 hour  Intake 1241.25 ml  Output    200 ml  Net 1041.25 ml     LAB RESULTS: CBC  Recent Labs Lab 04/12/14 0916 04/16/14 2024 04/17/14 0455  WBC 7.7 11.9* 8.6  HGB 10.9* 12.0 12.0  HCT 33.8* 37.7 37.0    PLT 351 447* 407*  MCV 93.4 92.6 92.0  MCH 30.1 29.5 29.9  MCHC 32.2 31.8 32.4  RDW 14.8* 14.4 14.3  LYMPHSABS 2.3  --   --   MONOABS 1.1*  --   --   EOSABS 0.0  --   --   BASOSABS 0.0  --   --     Chemistries   Recent Labs Lab 04/12/14 0916 04/16/14 2024 04/17/14 0455 04/18/14 0907  NA 140 136 137 140  K 4.4 3.8 4.1 3.7  CL  --  99 102 105  CO2 25 26 27 24   GLUCOSE 109 132* 170* 178*  BUN 9.1 35* 33* 19  CREATININE 0.9 1.73* 1.23* 0.75  CALCIUM 9.1 9.3 9.0 9.4    CBG:  Recent Labs Lab 04/16/14 2002  GLUCAP 121*    GFR Estimated Creatinine Clearance: 68.7 mL/min (by C-G formula based on Cr of 0.75).  Coagulation profile No results for input(s): INR, PROTIME in the last 168 hours.  Cardiac Enzymes No results for input(s): CKMB, TROPONINI, MYOGLOBIN in the last 168 hours.  Invalid input(s): CK  Invalid input(s): POCBNP No results for input(s): DDIMER in the last 72 hours. No results for input(s): HGBA1C in the last 72 hours.  Recent Labs  04/18/14 0907  CHOL 173  HDL 45  LDLCALC 105*  TRIG 116  CHOLHDL 3.8   No results for input(s): TSH, T4TOTAL, T3FREE, THYROIDAB in the last 72 hours.  Invalid input(s): FREET3 No results for input(s): VITAMINB12, FOLATE, FERRITIN, TIBC, IRON, RETICCTPCT in the last 72 hours. No results for input(s): LIPASE, AMYLASE in the last 72 hours.  Urine Studies No results for input(s): UHGB, CRYS in the last 72 hours.  Invalid input(s): UACOL, UAPR, USPG, UPH, UTP, UGL, UKET, UBIL, UNIT, UROB, ULEU, UEPI, UWBC, URBC, UBAC, CAST, UCOM, BILUA  MICROBIOLOGY: No results found for this or any previous visit (from the past 240 hour(s)).  RADIOLOGY STUDIES/RESULTS: Dg Chest 2 View  04/18/2014   CLINICAL DATA:  Stroke protocol  EXAM: CHEST  2 VIEW  COMPARISON:  Chest CT 12/24/2013  FINDINGS: Right IJ porta catheter is in good position, tip at the upper cavoatrial junction. Normal heart size and aortic contours.  There is  chronic reticulation in the right upper lung. No edema, consolidation, effusion, or pneumothorax. No acute osseous findings. Patient status post 2 level thoracic vertebral body augmentation with cement.  IMPRESSION: No active cardiopulmonary disease.   Electronically Signed   By: Monte Fantasia M.D.   On: 04/18/2014 04:15   Ct Head (brain) Wo Contrast  04/16/2014   CLINICAL DATA:  Altered mental status and slurred speech. Last seen baseline at approximately noon today. Began noticing altered status at 15:00 today. Weakness, slurred speech, unsteady on feet, confusion. History of lung cancer.  EXAM: CT HEAD WITHOUT CONTRAST  TECHNIQUE: Contiguous axial  images were obtained from the base of the skull through the vertex without intravenous contrast.  COMPARISON:  MRI brain 10/09/2006  FINDINGS: There is subtle low-attenuation change with loss of gray-white matter distinction in the left frontal lobe. Area of involvement measures about 2.6 x 3.1 cm. Appearance may represent focal ischemia or metastasis. Suggest MRI for further evaluation. Ventricles and sulci are otherwise symmetrical. No ventricular dilatation. No abnormal extra-axial fluid collections. Basal cisterns are not effaced. No acute intracranial hemorrhage. Mucosal thickening throughout the paranasal sinuses with opacification in some of the ethmoid air cells. Mastoid air cells are not opacified. Calvarium appears intact.  IMPRESSION: Subtle area of low attenuation in the left frontal lobe. This could represent focal ischemia or metastasis. Suggest MRI for further evaluation. No acute intracranial hemorrhage. No significant mass effect.   Electronically Signed   By: Lucienne Capers M.D.   On: 04/16/2014 22:09   Mr Jeri Cos PZ Contrast  04/17/2014   CLINICAL DATA:  64 year old hypertensive female with sudden onset of altered mental status, slurred speech and unsteady gait. Lung cancer with metastatic disease post gamma knife surgery. Subsequent  encounter.  EXAM: MRI HEAD WITHOUT AND WITH CONTRAST  TECHNIQUE: Multiplanar, multiecho pulse sequences of the brain and surrounding structures were obtained without and with intravenous contrast.  CONTRAST:  68mL MULTIHANCE GADOBENATE DIMEGLUMINE 529 MG/ML IV SOLN  COMPARISON:  04/16/2014 head CT. Report from 10/09/2006 MR. Images not available currently.  FINDINGS: This exam was interpreted during a PACS downtime with limited availability of comparison cases. It has been flagged for review following the downtime. If clinically indicated after this review, an addendum will be issued providing details about comparison to prior imaging.  Moderate-size acute hemorrhagic anterior left frontal lobe infarct with extension to the anterior left peri operculum region and subinsular region. Mild local mass effect.  Small acute scattered right parietal lobe infarcts.  Remote small right lenticular nucleus infarct.  No intracranial enhancing lesion or bony destructive lesion to suggest the presence of intracranial metastatic disease.  No hydrocephalus.  Vertebral arteries, basilar artery and internal carotid arteries as well as major dural sinuses are patent.  Slightly small pituitary gland. Pineal region and orbital structures unremarkable.  Paranasal sinus mucosal thickening/ partial opacification most notable maxillary sinuses, ethmoid sinus air cells and right sphenoid sinus.  IMPRESSION: This exam was interpreted during a PACS downtime with limited availability of comparison cases. It has been flagged for review following the downtime. If clinically indicated after this review, an addendum will be issued providing details about comparison to prior imaging.  Moderate-size acute hemorrhagic anterior left frontal lobe infarct with extension to the anterior left peri operculum region and subinsular region. Mild local mass effect.  Small acute scattered right parietal lobe infarcts.  Remote small right lenticular nucleus  infarct.  No intracranial enhancing lesion or bony destructive lesion to suggest the presence of intracranial metastatic disease.  Paranasal sinus mucosal thickening/ partial opacification most notable maxillary sinuses, ethmoid sinus air cells and right sphenoid sinus.  These results were called by telephone at the time of interpretation on 04/17/2014 at 11:08 am to [facility] patient's nurse, who verbally acknowledged these results. Request to call physician on call with results.   Electronically Signed   By: Genia Del M.D.   On: 04/17/2014 11:19   US Renal  04/17/2014   CLINICAL DATA:  Acute kidney injury. Elevated serum creatinine level. History of hypertension and lung cancer. Initial encounter.  EXAM: RENAL/URINARY TRACT ULTRASOUND COMPLETE  COMPARISON:  None.  FINDINGS: Right Kidney:  Length: 10.5 cm. Echogenicity within normal limits. No mass or hydronephrosis visualized.  Left Kidney:  Length: 11.2 cm. Echogenicity within normal limits. No mass or hydronephrosis visualized.  Bladder:  Decompressed and suboptimally evaluated.  IMPRESSION: Normal renal ultrasound. No evidence of hydronephrosis. Decompressed bladder.   Electronically Signed   By: Richardean Sale M.D.   On: 04/17/2014 19:05   Mr Jodene Nam Head/brain Wo Cm  04/18/2014   CLINICAL DATA:  Stroke, history of metastatic lung cancer with brain metastasis, status post gamma knife surgery.  EXAM: MRA HEAD WITHOUT CONTRAST  TECHNIQUE: Angiographic images of the Circle of Willis were obtained using MRA technique without intravenous contrast.  COMPARISON:  MRI of the head with contrast April 10th 2016 and CT of the head April 16, 2014  FINDINGS: Anterior circulation: Normal flow related enhancement of the included cervical, petrous, cavernous and supra clinoid internal carotid arteries. Patent anterior communicating artery. Normal flow related enhancement of the anterior and middle cerebral arteries,, however there is slightly decreased number of distal LEFT  middle cerebral artery branches.  No large vessel occlusion, high-grade stenosis, abnormal luminal irregularity, aneurysm.  Posterior circulation: RIGHT vertebral artery is dominant. Basilar artery is patent, with normal flow related enhancement of the main branch vessels. Normal flow related enhancement of the posterior cerebral arteries. Tiny LEFT posterior communicating artery is present.  No large vessel occlusion, high-grade stenosis, abnormal luminal irregularity, aneurysm.  LEFT frontal lobe T1 shortening partially imaged.  IMPRESSION: No large vessel occlusion. Decreased number of distal LEFT middle cerebral artery branches.  LEFT frontal lobe hemorrhage versus enhancement partially imaged.   Electronically Signed   By: Elon Alas   On: 04/18/2014 02:59    Oren Binet, MD  [facility] Pager:[phone]  If 7PM-7AM, please contact night-coverage [email or web address] Password [email or web address] 04/18/2014, 10:52 AM   LOS: 2 days

## 2014-04-18 NOTE — Care Management Note (Unsigned)
    Page 1 of 1   04/18/2014     1:09:41 PM CARE MANAGEMENT NOTE 04/18/2014  Patient:  Danielle [name], Danielle [name]   Account Number:  [id number]  Date Initiated:  04/18/2014  Documentation initiated by:  Carles Collet  Subjective/Objective Assessment:   + CVA with CA mets to brain. from home gets help from children     Action/Plan:   will follow for discharge needs   Anticipated DC Date:  04/19/2014   Anticipated DC Plan:  [place]  CM consult      Choice offered to / List presented to:             Status of service:  In process, will continue to follow Medicare Important Message given?  YES (If response is "NO", the following Medicare IM given date fields will be blank) Date Medicare IM given:  04/18/2014 Medicare IM given by:  Carles Collet Date Additional Medicare IM given:   Additional Medicare IM given by:    Discharge Disposition:    Per UR Regulation:    If discussed at Long Length of Stay Meetings, dates discussed:    Comments:

## 2014-04-18 NOTE — Procedures (Signed)
ELECTROENCEPHALOGRAM REPORT   Patient: Danielle [name]       Room #: 5W86 EEG No. ID: [id number] Age: 64 y.o.        Sex: female Referring Physician: Ghimire Report Date:  04/18/2014        Interpreting Physician: Alexis Goodell  History: Danielle [name] is an 64 y.o. female with difficulty with speech and a left frontal lobe hemorrhage on imaging.  Medications:  Scheduled: . alteplase  2 mg Intracatheter Once  . amLODipine  10 mg Oral Daily  . aspirin EC  325 mg Oral Daily  . cefTRIAXone (ROCEPHIN)  IV  1 g Intravenous Q24H  . dexamethasone  4 mg Oral 4 times per day  . morphine  60 mg Oral Q12H  . olopatadine  1 drop Both Eyes BID  . [START ON 04/19/2014] simvastatin  40 mg Oral Daily    Conditions of Recording:  This is a 16 channel EEG carried out with the patient in the awake state.  Description:  The waking background activity consists of a low voltage, symmetrical, fairly well organized, 9 Hz alpha activity, seen from the parieto-occipital and posterior temporal regions.  Low voltage fast activity, poorly organized, is seen anteriorly and is at times superimposed on more posterior regions.  A mixture of theta and alpha rhythms are seen from the central and temporal regions.  There are intermittent periods during the tracing when this activity is not symmetric.  On these occasions there is noted slowing over the left hemisphere, most notably over the left frontotemporal region.  This slowing is due to an underlying poorly organized delta activity that slows the background rhythm.  There is also noted rare sharp transients over the left temporal region with phase reversal at T5. There are three occasions during the tracing when there is noted bifrontal rhythmical sharp activity.  On the first occasion the episode lasts about 7 seconds.  The patient is unable to respond during this period of time.  The patient has two more events of bifrontal sharp activity lasting about 11 seconds  each but on these occasions the patient remains responsive and is able to count backwards from 20.    The patient does not drowse or sleep. Hyperventilation and intermittent photic stimulation were not performed.    IMPRESSION: This is an abnormal electroencephalogram secondary to slowing and sharp activity over the left frontotemporal region consistent with the patient's history of a frontal lobe hemorrhage.  Also noted were periods of rhythmical bifrontal sharp activity that are concerning for electrographic and electroclinical seizure activity.     Alexis Goodell, MD [facility] [phone] 04/18/2014, 2:20 PM

## 2014-04-18 NOTE — Progress Notes (Signed)
    [facility] has been requested to perform a transesophageal echocardiogram on 04/19/14 for CVA.  After careful review of history and examination, the risks and benefits of transesophageal echocardiogram have been explained including risks of esophageal damage, perforation (1:10,000 risk), bleeding, pharyngeal hematoma as well as other potential complications associated with conscious sedation including aspiration, arrhythmia, respiratory failure and death. Alternatives to treatment were discussed, questions were answered. Patient is willing to proceed.   Leanor Kail, PA-C 04/18/2014 3:44 PM

## 2014-04-18 NOTE — Progress Notes (Signed)
VASCULAR LAB PRELIMINARY  PRELIMINARY  PRELIMINARY  PRELIMINARY  Carotid Duplex completed.    Preliminary report:  Carotid stenosis within the 1-39% range of stenosis bilaterally.                                 Patent vertebral arteries with antegrade flow bilaterally.  August Albino, RVT 04/18/2014, 2:44 PM

## 2014-04-18 NOTE — Progress Notes (Addendum)
STROKE TEAM PROGRESS NOTE   SUBJECTIVE (INTERVAL HISTORY) Her daughter and friend are at the bedside.  Overall she feels her condition is rapidly improving. She still has mild word finding difficulties. She stated that she had lung cancer 9 years ago, currently stable and still receiving maintenance chemotherapy. She had brain metastases status post, knife 7 years ago and one year ago. She also had left heat metastasis status post radiation. Discussed with Dr. Rogue Jury, her lung cancer with metastasis are all stable.   OBJECTIV daughter and friend E Temp:  [97.9 F (36.6 C)-98.3 F (36.8 C)] 98.1 F (36.7 C) (04/11 0958) Pulse Rate:  [69-88] 75 (04/11 0958) Cardiac Rhythm:  [-] Normal sinus rhythm (04/11 0849) Resp:  [18] 18 (04/11 0958) BP: (133-146)/(61-80) 146/78 mmHg (04/11 0958) SpO2:  [98 %-100 %] 100 % (04/11 0958)   Recent Labs Lab 04/16/14 2002  GLUCAP 121*    Recent Labs Lab 04/12/14 0916 04/16/14 2024 04/17/14 0455 04/18/14 0907  NA 140 136 137 140  K 4.4 3.8 4.1 3.7  CL  --  99 102 105  CO2 25 26 27 24   GLUCOSE 109 132* 170* 178*  BUN 9.1 35* 33* 19  CREATININE 0.9 1.73* 1.23* 0.75  CALCIUM 9.1 9.3 9.0 9.4    Recent Labs Lab 04/12/14 0916 04/16/14 2024  AST 15 24  ALT 9 21  ALKPHOS 76 60  BILITOT 0.29 0.8  PROT 6.6 6.7  ALBUMIN 3.1* 3.7    Recent Labs Lab 04/12/14 0916 04/16/14 2024 04/17/14 0455  WBC 7.7 11.9* 8.6  NEUTROABS 4.2  --   --   HGB 10.9* 12.0 12.0  HCT 33.8* 37.7 37.0  MCV 93.4 92.6 92.0  PLT 351 447* 407*   No results for input(s): CKTOTAL, CKMB, CKMBINDEX, TROPONINI in the last 168 hours. No results for input(s): LABPROT, INR in the last 72 hours.  Recent Labs  04/17/14 0019  COLORURINE YELLOW  LABSPEC 1.014  PHURINE 5.5  GLUCOSEU NEGATIVE  HGBUR TRACE*  BILIRUBINUR NEGATIVE  KETONESUR NEGATIVE  PROTEINUR NEGATIVE  UROBILINOGEN 0.2  NITRITE NEGATIVE  LEUKOCYTESUR SMALL*       Component Value Date/Time    CHOL 173 04/18/2014 0907   TRIG 116 04/18/2014 0907   HDL 45 04/18/2014 0907   CHOLHDL 3.8 04/18/2014 0907   VLDL 23 04/18/2014 0907   LDLCALC 105* 04/18/2014 0907   Lab Results  Component Value Date   HGBA1C 5.8 11/12/2013   No results found for: LABOPIA, COCAINSCRNUR, LABBENZ, AMPHETMU, THCU, LABBARB  No results for input(s): ETH in the last 168 hours.  I have personally reviewed the radiological images below and agree with the radiology interpretations.  Dg Chest 2 View  04/18/2014    IMPRESSION: No active cardiopulmonary disease.    Ct Head (brain) Wo Contrast  04/16/2014   IMPRESSION: Subtle area of low attenuation in the left frontal lobe. This could represent focal ischemia or metastasis. Suggest MRI for further evaluation. No acute intracranial hemorrhage. No significant mass effect.     Mr Jeri Cos Wo Contrast  04/17/2014   IMPRESSION:  Moderate-size acute hemorrhagic anterior left frontal lobe infarct with extension to the anterior left peri operculum region and subinsular region. Mild local mass effect.  Small acute scattered right parietal lobe infarcts.  Remote small right lenticular nucleus infarct.  No intracranial enhancing lesion or bony destructive lesion to suggest the presence of intracranial metastatic disease.  Paranasal sinus mucosal thickening/ partial opacification most notable maxillary sinuses,  ethmoid sinus air cells and right sphenoid sinus.    US Renal  04/17/2014   IMPRESSION: Normal renal ultrasound. No evidence of hydronephrosis. Decompressed bladder.     Mra Head/brain Wo Cm  04/18/2014   IMPRESSION: No large vessel occlusion. Decreased number of distal LEFT middle cerebral artery branches.  LEFT frontal lobe hemorrhage versus enhancement partially imaged.      Carotid Doppler  Bilateral: 1-39% ICA stenosis. Vertebral artery flow is antegrade.  2D Echocardiogram  - Left ventricle: The cavity size was normal. Wall thickness was normal. Systolic  function was normal. The estimated ejection fraction was in the range of 55% to 60%. Wall motion was normal; there were no regional wall motion abnormalities. Left ventricular diastolic function parameters were normal.  UE and LE venous Doppler - negative for DVT  EEG - This is an abnormal electroencephalogram secondary to slowing and sharp activity over the left frontotemporal region consistent with the patient's history of a frontal lobe hemorrhage. Also noted were periods of rhythmical bifrontal sharp activity that are concerning for electrographic and electroclinical seizure activity.   TEE pending  PHYSICAL EXAM  Temp:  [97.9 F (36.6 C)-98.3 F (36.8 C)] 98.1 F (36.7 C) (04/11 0958) Pulse Rate:  [69-88] 75 (04/11 0958) Resp:  [18] 18 (04/11 0958) BP: (133-146)/(61-80) 146/78 mmHg (04/11 0958) SpO2:  [98 %-100 %] 100 % (04/11 0958)  General - Well nourished, well developed, in no apparent distress.  Ophthalmologic - Sharp disc margins OU.  Cardiovascular - Regular rate and rhythm with no murmur.  Neck - supple, no carotid bruits  Mental Status -  Level of arousal and orientation to person were intact, but not orientated to place, time, and president. Language examination showed partial expressive aphasia, able to repeat, able to name 2/3, follows commands. Attention span and concentration were impaired, not able to backward spelling WORLD. Fund of Knowledge was assessed and was impaired, not knowing previous presidents.  Cranial Nerves II - XII - II - Visual field intact OU. III, IV, VI - Extraocular movements intact. V - Facial sensation intact bilaterally. VII - Facial movement intact bilaterally. VIII - Hearing & vestibular intact bilaterally. X - Palate elevates symmetrically. XI - Chin turning & shoulder shrug intact bilaterally. XII - Tongue protrusion intact.  Motor Strength - The patient's strength was normal in all extremities and pronator drift  was absent.  Bulk was normal and fasciculations were absent.   Motor Tone - Muscle tone was assessed at the neck and appendages and was normal.  Reflexes - The patient's reflexes were symmetrical in all extremities and she had no pathological reflexes.  Sensory - Light touch, temperature/pinprick were assessed and were symmetrical.    Coordination - The patient had normal movements in the hands and feet with no ataxia or dysmetria.  Tremor was absent.  Gait and Station - not tested due to fatigue.   ASSESSMENT/PLAN [name] is a 64 y.o. female with history of lung cancer 9 years ago on maintenance chemotherapy, brain metastasis 2 status post radiation, metastasis to left keep status post radiation admitted for aphasia. Symptoms improved.    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 Hypercoagulable state due to malignancy has to be considered, however, I discussed with Dr. Rogue Jury, her oncologist, over the phone, and it seems her malignancy is rather stable at this stage. Will need continue to look for other etiologies.  MRI  left Broca's area and right parietal infarcts   MRA  decreased left MCA branches   Carotid Doppler  unremarkable   2D Echo  unremarkable   Venous Doppler - no DVT upper and lower extremities   LDL 105, not at goal   HgbA1c pending  Recommend TEE for further evaluation of stroke etiologies. May consider loop recorder if TEE negative depending on EP considerations.  SCDs  for VTE prophylaxis  Diet regular Room service appropriate?: Yes; Fluid consistency:: Thin   no antithrombotic prior to admission, now on aspirin 325 mg orally every day  Patient counseled to be compliant with her antithrombotic medications  Ongoing aggressive stroke risk factor management  Therapy recommendations:  Pending   Disposition:  Pending  Abnormal EEG  Bifrontal  sharps  Concerning for lower the seizure threshold in patient with brain metastases  Recommend treatment with vimpat   Vimpat 50 mg twice a day added  Lung cancer   9 years history currently on maintenance chemotherapy  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 Rogue Jury  Hypertension  Home meds:   Amlodipine, lisinopril  Permissive hypertension (OK if <220/120) for 24-48 hours post stroke and then gradually normalized within 5-7 days. Currently on amlodipine,   Stable  Patient counseled to be compliant with her blood pressure medications  Hyperlipidemia  Home meds:  Simvastatin 20   Currently on Lipitor 20   LDL 105, goal < 70  Continue lipitor at discharge  Other Stroke Risk Factors  Former smoker  Hypercoagulable state due to malignancy  Other Active Problems  Thrombocytosis  Elevated creatinine  Other Pertinent History    Hospital day # 2  Rosalin Hawking, MD PhD Stroke Neurology 04/18/2014 5:46 PM    To contact Stroke Continuity provider, please refer to [email or web address] After hours, contact General Neurology

## 2014-04-18 NOTE — Evaluation (Signed)
Speech Language Pathology Evaluation Patient Details Name: Danielle [name] MRN: [id number] DOB: 26-Dec-1950 Today's Date: 04/18/2014 Time: 4268-3419 SLP Time Calculation (min) (ACUTE ONLY): 22 min  Problem List:  Patient Active Problem List   Diagnosis Date Noted  . Slurred speech 04/17/2014  . Acute encephalopathy 04/17/2014  . Acute CVA (cerebrovascular accident) 04/17/2014  . Stroke 04/17/2014  . AKI (acute kidney injury) 04/16/2014  . Cancer-related pain 12/28/2013  . Atherosclerosis of aorta 08/13/2013  . Cancer of upper lobe of right lung 11/19/2010  . BENIGN POSITIONAL VERTIGO 06/13/2009  . CONSTIPATION 04/07/2009  . Diabetes mellitus 09/29/2008  . HYPERCHOLESTEROLEMIA 09/29/2008  . INSOMNIA 02/22/2008  . ALLERGIC CONJUNCTIVITIS 01/20/2008  . PULMONARY NOCARDIOSIS 01/07/2008  . Essential hypertension 01/07/2008  . ANEMIA-NOS 03/31/2006  . LUNG CANCER, HX OF 03/31/2006  . PNEUMONIA, HX OF 03/31/2006  . LAMINECTOMY, HX OF 03/31/2006   Past Medical History:  Past Medical History  Diagnosis Date  . Hypertension   . Anemia   . H/O: pneumonia   . History of tobacco abuse quit 10/08    on nicotine patch  . Fibromyalgia   . Hypokalemia   . DJD (degenerative joint disease), cervical   . Thrush 12/11/2010  . 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 had chemo and radiation for lung ca   Past Surgical History:  Past Surgical History  Procedure Laterality Date  . Cervical laminectomy  1995  . Other surgical history  2008    Gamma knife surgery to remove brain met   . 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 Knee surgery  1990    Left x 2  . Portacath placement  -[name]    TIP IN CAVOATRIAL JUNCTION   HPI:  Danielle [name] is an unfortunate 64 y.o. female with a history of lung cancer with brain metastasis status post gamma knife procedures, hypertension, previous tobacco history, and fibromyalgia who was  visiting her daughter when it was noted that the patient's speech was becoming slurred and she seemed to be using incorrect words while trying to communicate.  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   Assessment / Plan / Recommendation Clinical Impression   Pt demonstrates mild impairment in cognition, expressive and receptive language skills. Vocal quality clear. Pt demonstrates mild impairment in naming skills with observed phonemic paraphasias and perseveration. Pt unaware of deficits. Pt benefits from repetition and phonemic cues. Discussed diagnosis of Aphasia with pt and family members and recommendation for outpatient SLP services. Speech will follow-up with Aphasia tx.     SLP Assessment       Follow Up Recommendations  Outpatient SLP    Frequency and Duration min 2x/week  2 weeks   Pertinent Vitals/Pain     SLP Goals  Potential to Achieve Goals (ACUTE ONLY): Good  SLP Evaluation Prior Functioning  Cognitive/Linguistic Baseline: Within functional limits   Cognition  Overall Cognitive Status: Impaired/Different from baseline Arousal/Alertness: Awake/alert Orientation Level: Oriented to person;Oriented to place;Disoriented to time;Oriented to situation Attention: Sustained;Focused Focused Attention: Appears intact Sustained Attention: Appears intact Awareness: Impaired Awareness Impairment: Intellectual impairment Executive Function: Sequencing Sequencing: Appears intact Behaviors: Impulsive;Verbal agitation;Perseveration Safety/Judgment: Impaired    Comprehension  Auditory Comprehension Overall Auditory Comprehension: Impaired Yes/No  Questions: Within Functional Limits Commands: Impaired One Step Basic Commands: 50-74% accurate Two Step Basic Commands: 50-74%  accurate Conversation: Simple EffectiveTechniques: Slowed speech;Repetition Visual Recognition/Discrimination Discrimination: Not tested Reading Comprehension Reading Status: Within funtional limits    Expression Expression Primary Mode of Expression: Verbal Verbal Expression Overall Verbal Expression: Impaired Automatic Speech: Counting;Day of week Level of Generative/Spontaneous Verbalization: Sentence Repetition: No impairment Naming: Impairment Confrontation: Impaired Pragmatics: No impairment Written Expression Dominant Hand: Right Written Expression: Not tested   Oral / Motor Oral Motor/Sensory Function Overall Oral Motor/Sensory Function: Appears within functional limits for tasks assessed Labial ROM: Within Functional Limits Labial Symmetry: Within Functional Limits Lingual ROM: Within Functional Limits Lingual Symmetry: Within Functional Limits Facial ROM: Within Functional Limits Facial Symmetry: Within Functional Limits Motor Speech Overall Motor Speech: Appears within functional limits for tasks assessed Articulation: Within functional limitis   GO     Bermudez-Bosch, Ixel Boehning 04/18/2014, 11:30 AM

## 2014-04-18 NOTE — Telephone Encounter (Signed)
Transferred call to Dr. Julien Nordmann from Neurologist, Dr, Sarina Ser.

## 2014-04-18 NOTE — Progress Notes (Signed)
EEG completed, results pending. 

## 2014-04-19 ENCOUNTER — Encounter (HOSPITAL_COMMUNITY)
Admission: EM | Disposition: A | Discharge status: Home / Self Care | Payer: Self-pay | Source: Home / Self Care | Attending: Internal Medicine

## 2014-04-19 ENCOUNTER — Encounter (HOSPITAL_COMMUNITY): Payer: Self-pay | Admitting: *Deleted

## 2014-04-19 DIAGNOSIS — I639 Cerebral infarction, unspecified: Secondary | ICD-10-CM

## 2014-04-19 DIAGNOSIS — I10 Essential (primary) hypertension: Secondary | ICD-10-CM

## 2014-04-19 HISTORY — PX: TEE WITHOUT CARDIOVERSION: SHX5443

## 2014-04-19 HISTORY — PX: LOOP RECORDER IMPLANT: SHX5477

## 2014-04-19 LAB — HEMOGLOBIN A1C
HEMOGLOBIN A1C: 6.1 % — AB (ref 4.8–5.6)
Mean Plasma Glucose: 128 mg/dL

## 2014-04-19 SURGERY — ECHOCARDIOGRAM, TRANSESOPHAGEAL
Anesthesia: Moderate Sedation

## 2014-04-19 SURGERY — LOOP RECORDER IMPLANT
Anesthesia: LOCAL

## 2014-04-19 MED ORDER — BUTAMBEN-TETRACAINE-BENZOCAINE 2-2-14 % EX AERO
INHALATION_SPRAY | CUTANEOUS | Status: DC | PRN
  Administered 2014-04-19: 2 via TOPICAL

## 2014-04-19 MED ORDER — MIDAZOLAM HCL 10 MG/2ML IJ SOLN
INTRAMUSCULAR | Status: DC | PRN
  Administered 2014-04-19 (×3): 2 mg via INTRAVENOUS

## 2014-04-19 MED ORDER — ISOPROTERENOL HCL 0.2 MG/ML IJ SOLN
2.0000 ug/min | INTRAMUSCULAR | Status: DC
  Filled 2014-04-19: qty 5

## 2014-04-19 MED ORDER — FENTANYL CITRATE 0.05 MG/ML IJ SOLN
INTRAMUSCULAR | Status: AC
  Filled 2014-04-19: qty 2

## 2014-04-19 MED ORDER — HEPARIN SOD (PORK) LOCK FLUSH 100 UNIT/ML IV SOLN
500.0000 [IU] | INTRAVENOUS | Status: AC | PRN
  Administered 2014-04-19: 500 [IU]

## 2014-04-19 MED ORDER — FENTANYL CITRATE 0.05 MG/ML IJ SOLN
INTRAMUSCULAR | Status: DC | PRN
  Administered 2014-04-19 (×3): 25 ug via INTRAVENOUS

## 2014-04-19 MED ORDER — ISOPROTERENOL HCL 0.2 MG/ML IJ SOLN
2.0000 ug/min | INTRAMUSCULAR | Status: DC
Start: 2014-04-19 — End: 2014-04-19
  Filled 2014-04-19: qty 2

## 2014-04-19 MED ORDER — ASPIRIN 325 MG PO TBEC: 325.0000 mg | DELAYED_RELEASE_TABLET | Freq: Every day | ORAL | Status: DC

## 2014-04-19 MED ORDER — DIPHENHYDRAMINE HCL 50 MG/ML IJ SOLN
INTRAMUSCULAR | Status: AC
  Filled 2014-04-19: qty 1

## 2014-04-19 MED ORDER — LIDOCAINE-EPINEPHRINE 1 %-1:100000 IJ SOLN
INTRAMUSCULAR | Status: AC
  Filled 2014-04-19: qty 1

## 2014-04-19 MED ORDER — MIDAZOLAM HCL 5 MG/ML IJ SOLN
INTRAMUSCULAR | Status: AC
  Filled 2014-04-19: qty 2

## 2014-04-19 MED ORDER — ATORVASTATIN CALCIUM 20 MG PO TABS: 20.0000 mg | ORAL_TABLET | Freq: Every day | ORAL | Status: DC

## 2014-04-19 MED ORDER — LACOSAMIDE 50 MG PO TABS: 50.0000 mg | ORAL_TABLET | Freq: Two times a day (BID) | ORAL | Status: DC

## 2014-04-19 NOTE — Progress Notes (Signed)
NURSING PROGRESS NOTE  Danielle [name] [id number] Discharge Data: 04/19/2014 4:16 PM Attending Provider: Jonetta Osgood, MD PCP:Amy Diona Browner, MD     Danielle [name] to be D/C'd Home per MD order.  Discussed with the patient the After Visit Summary and all questions fully answered. All IV's discontinued with no bleeding noted. All belongings returned to patient for patient to take home.   Last Vital Signs:  Blood pressure 162/65, pulse 103, temperature 98.6 F (37 C), temperature source Oral, resp. rate 16, height 5\' 3"  (1.6 m), weight 68.7 kg (151 lb 7.3 oz), SpO2 100 %.  Discharge Medication List   Medication List    STOP taking these medications        simvastatin 20 MG tablet  Commonly known as:  ZOCOR      TAKE these medications        ALPRAZolam 0.25 MG tablet  Commonly known as:  XANAX  Take 1 tablet (0.25 mg total) by mouth at bedtime as needed for sleep.     amLODipine 10 MG tablet  Commonly known as:  NORVASC  Take 1 tablet (10 mg total) by mouth daily.     aspirin 325 MG EC tablet  Take 1 tablet (325 mg total) by mouth daily.     atorvastatin 20 MG tablet  Commonly known as:  LIPITOR  Take 1 tablet (20 mg total) by mouth daily at 6 PM.     fluticasone 50 MCG/ACT nasal spray  Commonly known as:  FLONASE  USE 1-2 SPRAYS IN EACH NOSTRIL DAILY     folic acid 1 MG tablet  Commonly known as:  FOLVITE  TAKE 1 TABLET BY MOUTH DAILY.     lacosamide 50 MG Tabs tablet  Commonly known as:  VIMPAT  Take 1 tablet (50 mg total) by mouth 2 (two) times daily.     lidocaine-prilocaine cream  Commonly known as:  EMLA  Apply topically as needed.     lisinopril 20 MG tablet  Commonly known as:  PRINIVIL,ZESTRIL  TAKE 2 TABLETS (40 MG TOTAL) BY MOUTH DAILY.     morphine 30 MG tablet  Commonly known as:  MSIR  Take 1 tablet (30 mg total) by mouth every 4 (four) hours as needed.     morphine 60 MG 12 hr tablet  Commonly known as:  MS CONTIN  Take 60 mg by  mouth 2 (two) times daily.     olopatadine 0.1 % ophthalmic solution  Commonly known as:  PATANOL  Place 1 drop into both eyes 2 (two) times daily.     ondansetron 8 MG tablet  Commonly known as:  ZOFRAN  TAKE 1 TABLET BY MOUTH EVERY 8 HOURS AS NEEDED FOR NAUSEA.     predniSONE 50 MG tablet  Commonly known as:  DELTASONE  Take 50 mg by mouth See admin instructions. Take 1 tablet (50 mg) 13 hours before CT scan, 7 hours before CT scan and 1 hour before CT scan (every 9 weeks)

## 2014-04-19 NOTE — Progress Notes (Signed)
Speech Pathology Note Pt out of room for procedure, will attempt treatment at a later date.  Danielle [name], [place] CCC-SLP [phone]

## 2014-04-19 NOTE — Progress Notes (Signed)
STROKE TEAM PROGRESS NOTE   SUBJECTIVE (INTERVAL HISTORY) Her daughter and friend are at the bedside.  Overall she feels her condition is rapidly improving. She still has mild word finding difficulties. She stated that she had lung cancer 9 years ago, currently stable and still receiving maintenance chemotherapy. She had brain metastases status post, knife 7 years ago and one year ago. She also had left heat metastasis status post radiation. Discussed with Dr. Rogue Jury, her lung cancer with metastasis are all stable.   OBJECTIV daughter and friend E Temp:  [97.5 F (36.4 C)-98.1 F (36.7 C)] 97.7 F (36.5 C) (04/12 1256) Pulse Rate:  [64-102] 65 (04/12 1256) Cardiac Rhythm:  [-] Normal sinus rhythm (04/12 0800) Resp:  [10-29] 18 (04/12 1256) BP: (111-207)/(57-139) 128/64 mmHg (04/12 1256) SpO2:  [96 %-100 %] 100 % (04/12 1256)   Recent Labs Lab 04/16/14 2002  GLUCAP 121*    Recent Labs Lab 04/16/14 2024 04/17/14 0455 04/18/14 0907  NA 136 137 140  K 3.8 4.1 3.7  CL 99 102 105  CO2 26 27 24   GLUCOSE 132* 170* 178*  BUN 35* 33* 19  CREATININE 1.73* 1.23* 0.75  CALCIUM 9.3 9.0 9.4    Recent Labs Lab 04/16/14 2024  AST 24  ALT 21  ALKPHOS 60  BILITOT 0.8  PROT 6.7  ALBUMIN 3.7    Recent Labs Lab 04/16/14 2024 04/17/14 0455  WBC 11.9* 8.6  HGB 12.0 12.0  HCT 37.7 37.0  MCV 92.6 92.0  PLT 447* 407*   No results for input(s): CKTOTAL, CKMB, CKMBINDEX, TROPONINI in the last 168 hours. No results for input(s): LABPROT, INR in the last 72 hours.  Recent Labs  04/17/14 0019  COLORURINE YELLOW  LABSPEC 1.014  PHURINE 5.5  GLUCOSEU NEGATIVE  HGBUR TRACE*  BILIRUBINUR NEGATIVE  KETONESUR NEGATIVE  PROTEINUR NEGATIVE  UROBILINOGEN 0.2  NITRITE NEGATIVE  LEUKOCYTESUR SMALL*       Component Value Date/Time   CHOL 173 04/18/2014 0907   TRIG 116 04/18/2014 0907   HDL 45 04/18/2014 0907   CHOLHDL 3.8 04/18/2014 0907   VLDL 23 04/18/2014 0907   LDLCALC  105* 04/18/2014 0907   Lab Results  Component Value Date   HGBA1C 6.1* 04/18/2014   No results found for: LABOPIA, COCAINSCRNUR, LABBENZ, AMPHETMU, THCU, LABBARB  No results for input(s): ETH in the last 168 hours.  I have personally reviewed the radiological images below and agree with the radiology interpretations.  Dg Chest 2 View  04/18/2014    IMPRESSION: No active cardiopulmonary disease.    Ct Head (brain) Wo Contrast  04/16/2014   IMPRESSION: Subtle area of low attenuation in the left frontal lobe. This could represent focal ischemia or metastasis. Suggest MRI for further evaluation. No acute intracranial hemorrhage. No significant mass effect.     Mr Danielle [name] Wo Contrast  04/17/2014   IMPRESSION:  Moderate-size acute hemorrhagic anterior left frontal lobe infarct with extension to the anterior left peri operculum region and subinsular region. Mild local mass effect.  Small acute scattered right parietal lobe infarcts.  Remote small right lenticular nucleus infarct.  No intracranial enhancing lesion or bony destructive lesion to suggest the presence of intracranial metastatic disease.  Paranasal sinus mucosal thickening/ partial opacification most notable maxillary sinuses, ethmoid sinus air cells and right sphenoid sinus.    US Renal  04/17/2014   IMPRESSION: Normal renal ultrasound. No evidence of hydronephrosis. Decompressed bladder.     Mra Head/brain Wo Cm  04/18/2014   IMPRESSION: No large vessel occlusion. Decreased number of distal LEFT middle cerebral artery branches.  LEFT frontal lobe hemorrhage versus enhancement partially imaged.      Carotid Doppler  Bilateral: 1-39% ICA stenosis. Vertebral artery flow is antegrade.  2D Echocardiogram  - Left ventricle: The cavity size was normal. Wall thickness was normal. Systolic function was normal. The estimated ejection fraction was in the range of 55% to 60%. Wall motion was normal; there were no regional wall motion  abnormalities. Left ventricular diastolic function parameters were normal.  UE and LE venous Doppler - negative for DVT  EEG - This is an abnormal electroencephalogram secondary to slowing and sharp activity over the left frontotemporal region consistent with the patient's history of a frontal lobe hemorrhage. Also noted were periods of rhythmical bifrontal sharp activity that are concerning for electrographic and electroclinical seizure activity.   TEE Normal LV size and systolic function, EF 27-25%. Normal regional wall motion. Normal RV size and systolic function. Trivial MR. Trivial TR, peak RV-RA gradient 37 mmHg. Trileaflet aortic valve with no AS or AI. Normal atrial sizes with no LAA thrombus. Catheter ends in right atrium, no clot or vegetation. Negative bubble study, no evidence for PFO or ASD. Normal caliber aorta, grade II plaque descending thoracic aorta.  PHYSICAL EXAM  Temp:  [97.5 F (36.4 C)-98.1 F (36.7 C)] 97.7 F (36.5 C) (04/12 1256) Pulse Rate:  [64-102] 65 (04/12 1256) Resp:  [10-29] 18 (04/12 1256) BP: (111-207)/(57-139) 128/64 mmHg (04/12 1256) SpO2:  [96 %-100 %] 100 % (04/12 1256)  General - Well nourished, well developed, in no apparent distress.  Ophthalmologic - Sharp disc margins OU.  Cardiovascular - Regular rate and rhythm with no murmur.  Neck - supple, no carotid bruits  Mental Status -  Level of arousal and orientation to person were intact, but not orientated to place, time, and president. Language examination showed partial expressive aphasia, able to repeat, able to name 2/3, follows commands. Attention span and concentration were impaired, not able to backward spelling WORLD. Fund of Knowledge was assessed and was impaired, not knowing previous presidents.  Cranial Nerves II - XII - II - Visual field intact OU. III, IV, VI - Extraocular movements intact. V - Facial sensation intact bilaterally. VII - Facial movement intact  bilaterally. VIII - Hearing & vestibular intact bilaterally. X - Palate elevates symmetrically. XI - Chin turning & shoulder shrug intact bilaterally. XII - Tongue protrusion intact.  Motor Strength - The patient's strength was normal in all extremities and pronator drift was absent.  Bulk was normal and fasciculations were absent.   Motor Tone - Muscle tone was assessed at the neck and appendages and was normal.  Reflexes - The patient's reflexes were symmetrical in all extremities and she had no pathological reflexes.  Sensory - Light touch, temperature/pinprick were assessed and were symmetrical.    Coordination - The patient had normal movements in the hands and feet with no ataxia or dysmetria.  Tremor was absent.  Gait and Station - not tested due to fatigue.   ASSESSMENT/PLAN [name] is a 64 y.o. female with history of lung cancer 9 years ago on maintenance chemotherapy, brain metastasis 2 status post radiation, metastasis to left keep status post radiation admitted for aphasia. Symptoms improved.    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 Hypercoagulable state due to malignancy has to  be considered, however, I discussed with Dr. Rogue Jury, her oncologist, over the phone, and it seems her malignancy is rather stable at this stage. Loop recorder placed after negative TEE.   MRI  left Broca's area and right parietal infarcts   MRA  decreased left MCA branches   Carotid Doppler  unremarkable   2D Echo  unremarkable   Venous Doppler - no DVT upper and lower extremities   LDL 105, not at goal   HgbA1c 6.1  TEE negative for vegetation and PFO  SCDs  for VTE prophylaxis  Diet regular Room service appropriate?: Yes; Fluid consistency:: Thin   no antithrombotic prior to admission, now on aspirin 325 mg orally every day. Continue antiplatelet on discharge.  Patient  counseled to be compliant with her antithrombotic medications  Ongoing aggressive stroke risk factor management  Abnormal EEG  Bifrontal sharps  Concerning for lower the seizure threshold in patient with brain metastases  Recommend treatment with vimpat   Vimpat 50 mg twice a day added  Continue Vimpat on discharge.  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 Rogue Jury  Hypertension  Home meds:   Amlodipine, lisinopril  BP goal normotensive now. Currently on amlodipine  Stable  Patient counseled to be compliant with her blood pressure medications  Hyperlipidemia  Home meds:  Simvastatin 20   Currently on Lipitor 20   LDL 105, goal < 70  Continue lipitor at discharge  Other Stroke Risk Factors  Former smoker  Possible hypercoagulable state due to malignancy  Other Active Problems  Thrombocytosis  Elevated creatinine  Other Pertinent History    Hospital day # 3  Neurology will sign off. Please call with questions. Pt will follow up with Dr. Erlinda Hong at [facility] in about 2 months. Thanks for the consult.  Danielle Hawking, MD PhD Stroke Neurology 04/19/2014 2:17 PM    To contact Stroke Continuity provider, please refer to [email or web address] After hours, contact General Neurology

## 2014-04-19 NOTE — Progress Notes (Signed)
  Echocardiogram Echocardiogram Transesophageal has been performed.  Lysle Rubens 04/19/2014, 11:52 AM

## 2014-04-19 NOTE — Discharge Summary (Signed)
PATIENT DETAILS Name: Danielle [name] Age: 64 y.o. Sex: female Date of Birth: 1950/07/23 MRN: [id number]. Admitting Physician: Etta Quill, DO PCP:Amy Diona Browner, MD  Admit Date: 04/16/2014 Discharge date: 04/19/2014  Recommendations for Outpatient Follow-up:  1. Recheck lipid panel and A1c in the next 3 months 2. Repeat CBC and chemistries at next few weeks 3. Ensure follow-up with cardiology-loop recorder implanted prior to discharge  PRIMARY DISCHARGE DIAGNOSIS:  Principal Problem:   Acute CVA (cerebrovascular accident) Active Problems:   Essential hypertension   Cancer of upper lobe of right lung   AKI (acute kidney injury)   Slurred speech   Acute encephalopathy   Stroke   Expressive aphasia   Cerebral infarction due to embolism of cerebral artery   HLD (hyperlipidemia)   Brain lesion      PAST MEDICAL HISTORY: Past Medical History  Diagnosis Date  . Hypertension   . Anemia   . H/O: pneumonia   . History of tobacco abuse quit 10/08    on nicotine patch  . Fibromyalgia   . Hypokalemia   . DJD (degenerative joint disease), cervical   . Thrush 12/11/2010  . 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 had chemo and radiation for lung ca    DISCHARGE MEDICATIONS: Current Discharge Medication List    START taking these medications   Details  aspirin EC 325 MG EC tablet Take 1 tablet (325 mg total) by mouth daily. Qty: 30 tablet, Refills: 0    atorvastatin (LIPITOR) 20 MG tablet Take 1 tablet (20 mg total) by mouth daily at 6 PM. Qty: 30 tablet, Refills: 0    lacosamide (VIMPAT) 50 MG TABS tablet Take 1 tablet (50 mg total) by mouth 2 (two) times daily. Qty: 60 tablet, Refills: 0      CONTINUE these medications which have NOT CHANGED   Details  ALPRAZolam (XANAX) 0.25 MG tablet Take 1 tablet (0.25 mg total) by mouth at bedtime as needed for sleep. Qty: 30 tablet, Refills: 0   Associated Diagnoses: Malignant neoplasm of upper  lobe of lung, unspecified laterality    amLODipine (NORVASC) 10 MG tablet Take 1 tablet (10 mg total) by mouth daily. Qty: 90 tablet, Refills: 3    fluticasone (FLONASE) 50 MCG/ACT nasal spray USE 1-2 SPRAYS IN EACH NOSTRIL DAILY Qty: 16 g, Refills: 11    folic acid (FOLVITE) 1 MG tablet TAKE 1 TABLET BY MOUTH DAILY. Qty: 90 tablet, Refills: 1    lidocaine-prilocaine (EMLA) cream Apply topically as needed. Qty: 30 g, Refills: 1   Associated Diagnoses: Lung cancer    lisinopril (PRINIVIL,ZESTRIL) 20 MG tablet TAKE 2 TABLETS (40 MG TOTAL) BY MOUTH DAILY. Qty: 60 tablet, Refills: 5    morphine (MS CONTIN) 60 MG 12 hr tablet Take 60 mg by mouth 2 (two) times daily. Refills: 0    morphine (MSIR) 30 MG tablet Take 1 tablet (30 mg total) by mouth every 4 (four) hours as needed. Qty: 60 tablet, Refills: 0   Associated Diagnoses: Malignant neoplasm of upper lobe of lung, unspecified laterality    olopatadine (PATANOL) 0.1 % ophthalmic solution Place 1 drop into both eyes 2 (two) times daily. Qty: 5 mL, Refills: 11    ondansetron (ZOFRAN) 8 MG tablet TAKE 1 TABLET BY MOUTH EVERY 8 HOURS AS NEEDED FOR NAUSEA. Qty: 30 tablet, Refills: 2   Associated Diagnoses: Malignant neoplasm of upper lobe of lung, unspecified laterality    predniSONE (  DELTASONE) 50 MG tablet Take 50 mg by mouth See admin instructions. Take 1 tablet (50 mg) 13 hours before CT scan, 7 hours before CT scan and 1 hour before CT scan (every 9 weeks)      STOP taking these medications     simvastatin (ZOCOR) 20 MG tablet         ALLERGIES:   Allergies  Allergen Reactions  . Contrast Media [Iodinated Diagnostic Agents] Hives  . Iohexol Hives     pt needs 13 hour prep--prev hives; added allergy 06/02/07 slg   . Sulfa Drugs Cross Reactors Hives  . Co-Trimoxazole Injection [Sulfamethoxazole-Trimethoprim] Rash    BRIEF HPI:  See H&P, Labs, Consult and Test reports for all details in brief, patient is a 64 year old  female with history of hypertension, dyslipidemia, lung cancer admitted for confusion and slurred speech. Further evaluation revealed her CVA.  CONSULTATIONS:   cardiology, hematology/oncology and neurology  PERTINENT RADIOLOGIC STUDIES: Dg Chest 2 View  04/18/2014   CLINICAL DATA:  Stroke protocol  EXAM: CHEST  2 VIEW  COMPARISON:  Chest CT 12/24/2013  FINDINGS: Right IJ porta catheter is in good position, tip at the upper cavoatrial junction. Normal heart size and aortic contours.  There is chronic reticulation in the right upper lung. No edema, consolidation, effusion, or pneumothorax. No acute osseous findings. Patient status post 2 level thoracic vertebral body augmentation with cement.  IMPRESSION: No active cardiopulmonary disease.   Electronically Signed   By: Monte Fantasia M.D.   On: 04/18/2014 04:15   Ct Head (brain) Wo Contrast  04/16/2014   CLINICAL DATA:  Altered mental status and slurred speech. Last seen baseline at approximately noon today. Began noticing altered status at 15:00 today. Weakness, slurred speech, unsteady on feet, confusion. History of lung cancer.  EXAM: CT HEAD WITHOUT CONTRAST  TECHNIQUE: Contiguous axial images were obtained from the base of the skull through the vertex without intravenous contrast.  COMPARISON:  MRI brain 10/09/2006  FINDINGS: There is subtle low-attenuation change with loss of gray-white matter distinction in the left frontal lobe. Area of involvement measures about 2.6 x 3.1 cm. Appearance may represent focal ischemia or metastasis. Suggest MRI for further evaluation. Ventricles and sulci are otherwise symmetrical. No ventricular dilatation. No abnormal extra-axial fluid collections. Basal cisterns are not effaced. No acute intracranial hemorrhage. Mucosal thickening throughout the paranasal sinuses with opacification in some of the ethmoid air cells. Mastoid air cells are not opacified. Calvarium appears intact.  IMPRESSION: Subtle area of low  attenuation in the left frontal lobe. This could represent focal ischemia or metastasis. Suggest MRI for further evaluation. No acute intracranial hemorrhage. No significant mass effect.   Electronically Signed   By: Lucienne Capers M.D.   On: 04/16/2014 22:09   Mr Jeri Cos TO Contrast  04/18/2014   ADDENDUM REPORT: 04/18/2014 19:07  ADDENDUM: Patient's prior exams have become available. No significant change from initial dictation.   Electronically Signed   By: Genia Del M.D.   On: 04/18/2014 19:07   04/18/2014   CLINICAL DATA:  64 year old hypertensive female with sudden onset of altered mental status, slurred speech and unsteady gait. Lung cancer with metastatic disease post gamma knife surgery. Subsequent encounter.  EXAM: MRI HEAD WITHOUT AND WITH CONTRAST  TECHNIQUE: Multiplanar, multiecho pulse sequences of the brain and surrounding structures were obtained without and with intravenous contrast.  CONTRAST:  78mL MULTIHANCE GADOBENATE DIMEGLUMINE 529 MG/ML IV SOLN  COMPARISON:  04/16/2014 head CT. Report from 10/09/2006  MR. Images not available currently.  FINDINGS: This exam was interpreted during a PACS downtime with limited availability of comparison cases. It has been flagged for review following the downtime. If clinically indicated after this review, an addendum will be issued providing details about comparison to prior imaging.  Moderate-size acute hemorrhagic anterior left frontal lobe infarct with extension to the anterior left peri operculum region and subinsular region. Mild local mass effect.  Small acute scattered right parietal lobe infarcts.  Remote small right lenticular nucleus infarct.  No intracranial enhancing lesion or bony destructive lesion to suggest the presence of intracranial metastatic disease.  No hydrocephalus.  Vertebral arteries, basilar artery and internal carotid arteries as well as major dural sinuses are patent.  Slightly small pituitary gland. Pineal region and  orbital structures unremarkable.  Paranasal sinus mucosal thickening/ partial opacification most notable maxillary sinuses, ethmoid sinus air cells and right sphenoid sinus.  IMPRESSION: This exam was interpreted during a PACS downtime with limited availability of comparison cases. It has been flagged for review following the downtime. If clinically indicated after this review, an addendum will be issued providing details about comparison to prior imaging.  Moderate-size acute hemorrhagic anterior left frontal lobe infarct with extension to the anterior left peri operculum region and subinsular region. Mild local mass effect.  Small acute scattered right parietal lobe infarcts.  Remote small right lenticular nucleus infarct.  No intracranial enhancing lesion or bony destructive lesion to suggest the presence of intracranial metastatic disease.  Paranasal sinus mucosal thickening/ partial opacification most notable maxillary sinuses, ethmoid sinus air cells and right sphenoid sinus.  These results were called by telephone at the time of interpretation on 04/17/2014 at 11:08 am to [facility] patient's nurse, who verbally acknowledged these results. Request to call physician on call with results.  Electronically Signed: By: Genia Del M.D. On: 04/17/2014 11:19   US Renal  04/17/2014   CLINICAL DATA:  Acute kidney injury. Elevated serum creatinine level. History of hypertension and lung cancer. Initial encounter.  EXAM: RENAL/URINARY TRACT ULTRASOUND COMPLETE  COMPARISON:  None.  FINDINGS: Right Kidney:  Length: 10.5 cm. Echogenicity within normal limits. No mass or hydronephrosis visualized.  Left Kidney:  Length: 11.2 cm. Echogenicity within normal limits. No mass or hydronephrosis visualized.  Bladder:  Decompressed and suboptimally evaluated.  IMPRESSION: Normal renal ultrasound. No evidence of hydronephrosis. Decompressed bladder.   Electronically Signed   By: Richardean Sale M.D.   On: 04/17/2014 19:05   Mr Jodene Nam  Head/brain Wo Cm  04/18/2014   CLINICAL DATA:  Stroke, history of metastatic lung cancer with brain metastasis, status post gamma knife surgery.  EXAM: MRA HEAD WITHOUT CONTRAST  TECHNIQUE: Angiographic images of the Circle of Willis were obtained using MRA technique without intravenous contrast.  COMPARISON:  MRI of the head with contrast April 10th 2016 and CT of the head April 16, 2014  FINDINGS: Anterior circulation: Normal flow related enhancement of the included cervical, petrous, cavernous and supra clinoid internal carotid arteries. Patent anterior communicating artery. Normal flow related enhancement of the anterior and middle cerebral arteries,, however there is slightly decreased number of distal LEFT middle cerebral artery branches.  No large vessel occlusion, high-grade stenosis, abnormal luminal irregularity, aneurysm.  Posterior circulation: RIGHT vertebral artery is dominant. Basilar artery is patent, with normal flow related enhancement of the main branch vessels. Normal flow related enhancement of the posterior cerebral arteries. Tiny LEFT posterior communicating artery is present.  No large vessel occlusion, high-grade stenosis, abnormal  luminal irregularity, aneurysm.  LEFT frontal lobe T1 shortening partially imaged.  IMPRESSION: No large vessel occlusion. Decreased number of distal LEFT middle cerebral artery branches.  LEFT frontal lobe hemorrhage versus enhancement partially imaged.   Electronically Signed   By: Elon Alas   On: 04/18/2014 02:59     PERTINENT LAB RESULTS: CBC:  Recent Labs  04/16/14 2024 04/17/14 0455  WBC 11.9* 8.6  HGB 12.0 12.0  HCT 37.7 37.0  PLT 447* 407*   CMET CMP     Component Value Date/Time   NA 140 04/18/2014 0907   NA 140 04/12/2014 0916   K 3.7 04/18/2014 0907   K 4.4 04/12/2014 0916   CL 105 04/18/2014 0907   CL 106 06/30/2012 1004   CO2 24 04/18/2014 0907   CO2 25 04/12/2014 0916   GLUCOSE 178* 04/18/2014 0907   GLUCOSE 109  04/12/2014 0916   GLUCOSE 108* 06/30/2012 1004   BUN 19 04/18/2014 0907   BUN 9.1 04/12/2014 0916   CREATININE 0.75 04/18/2014 0907   CREATININE 0.9 04/12/2014 0916   CALCIUM 9.4 04/18/2014 0907   CALCIUM 9.1 04/12/2014 0916   PROT 6.7 04/16/2014 2024   PROT 6.6 04/12/2014 0916   ALBUMIN 3.7 04/16/2014 2024   ALBUMIN 3.1* 04/12/2014 0916   AST 24 04/16/2014 2024   AST 15 04/12/2014 0916   ALT 21 04/16/2014 2024   ALT 9 04/12/2014 0916   ALKPHOS 60 04/16/2014 2024   ALKPHOS 76 04/12/2014 0916   BILITOT 0.8 04/16/2014 2024   BILITOT 0.29 04/12/2014 0916   GFRNONAA 90* 04/18/2014 0907   GFRAA >90 04/18/2014 0907    GFR Estimated Creatinine Clearance: 68.7 mL/min (by C-G formula based on Cr of 0.75). No results for input(s): LIPASE, AMYLASE in the last 72 hours. No results for input(s): CKTOTAL, CKMB, CKMBINDEX, TROPONINI in the last 72 hours. Invalid input(s): POCBNP No results for input(s): DDIMER in the last 72 hours.  Recent Labs  04/18/14 0907  HGBA1C 6.1*    Recent Labs  04/18/14 0907  CHOL 173  HDL 45  LDLCALC 105*  TRIG 116  CHOLHDL 3.8   No results for input(s): TSH, T4TOTAL, T3FREE, THYROIDAB in the last 72 hours.  Invalid input(s): FREET3 No results for input(s): VITAMINB12, FOLATE, FERRITIN, TIBC, IRON, RETICCTPCT in the last 72 hours. Coags: No results for input(s): INR in the last 72 hours.  Invalid input(s): PT Microbiology: No results found for this or any previous visit (from the past 240 hour(s)).   BRIEF HOSPITAL COURSE:  Acute CVA (cerebrovascular accident): Admitted with slurred speech, MRI brain confirmed acute CVA in the left frontal lobe and acute CVA in the right parietal lobe. Although improved., continues to have nonsensical speech at times.Transthoracic echocardiogram and trans-esophageal Echo negative for a embolic etiology. Carotid Doppler does not show significant stenosis. 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 Will continue Aspirin on discharge. LDL not at goal, at 105-continue Lipitor on discharge. A1C 6.1.  .  Active Problems:   Seizure:EEG positive for rhythmical bifrontal sharp activity that are concerning for electrographic and electroclinical seizure activity. Started on Vimpat at the recommendation of Neurology   Essential hypertension: Relatively well controlled with amlodipine.Resume Lisinopril on discharge.   Dyslipidemia: See above   UTI: Completed a 3 day course of  Rocephin, will not be discharged on Abx. Is afebrile   Acute renal failure: Resolved.. Suspect  etiology was pre-renal azotemia. Renal ultrasound was negative for hydronephrosis.   History of metastatic non-small cell lung cancer, with known metastatic brain tumor status post a stereotactic radiotherapy performed at [facility] 2014: Was started on Decadron on admission-as CT scan done on admission showed possible metastases versus infarct. Suspect we could now discontinue Decadron as MRI confirms infarct. Follows with Dr. Julien Nordmann as an outpatient.  TODAY-DAY OF DISCHARGE:  Subjective:   Danielle [name] today has no headache,no chest abdominal pain,no new weakness tingling or numbness, feels much better wants to go home today.   Objective:   Blood pressure 128/64, pulse 65, temperature 97.7 F (36.5 C), temperature source Oral, resp. rate 18, height 5\' 3"  (1.6 m), weight 68.7 kg (151 lb 7.3 oz), SpO2 100 %.  Intake/Output Summary (Last 24 hours) at 04/19/14 1435 Last data filed at 04/19/14 0900  Gross per 24 hour  Intake    579 ml  Output      0 ml  Net    579 ml   Filed Weights   04/17/14 0003  Weight: 68.7 kg (151 lb 7.3 oz)    Exam Awake Alert, Oriented *3, No new F.N deficits, Normal affect [place].AT,PERRAL Supple Neck,No JVD, No cervical lymphadenopathy appriciated.  Symmetrical Chest wall  movement, Good air movement bilaterally, CTAB RRR,No Gallops,Rubs or new Murmurs, No Parasternal Heave +ve B.Sounds, Abd Soft, Non tender, No organomegaly appriciated, No rebound -guarding or rigidity. No Cyanosis, Clubbing or edema, No new Rash or bruise  DISCHARGE CONDITION: Stable  DISPOSITION: Home  DISCHARGE INSTRUCTIONS:    Activity:  As tolerated   Diet recommendation: Diabetic Diet Heart Healthy diet   Discharge Instructions    Ambulatory referral to Neurology    Complete by:  As directed   Pt will follow up with Dr. Erlinda Hong at [facility] in about 2 months. Thanks.     Call MD for:  extreme fatigue    Complete by:  As directed      Diet - low sodium heart healthy    Complete by:  As directed      Increase activity slowly    Complete by:  As directed            Follow-up Information    Follow up with [facility] On 04/28/2014.   Why:  at 3:30PM for wound check   Contact information:   [address] [address] [place] [address]       Follow up with Xu,Jindong, MD. Schedule an appointment as soon as possible for a visit in 2 months.   Specialty:  Neurology   Why:  stroke clinic   Contact information:   [address] [place] [address] [phone]       Follow up with [name], MD. Schedule an appointment as soon as possible for a visit in 1 week.   Specialty:  Family Medicine   Contact information:   [place] [address] [phone]       Follow up with Eilleen Kempf., MD. Schedule an appointment as soon as possible for a visit in 2 weeks.   Specialty:  Oncology   Contact information:   [place] [id number] [phone]         Total Time spent on discharge equals 45 minutes.  SignedOren Binet 04/19/2014 2:35 PM

## 2014-04-19 NOTE — Interval H&P Note (Signed)
History and Physical Interval Note:  04/19/2014 11:00 AM  Danielle [name]  has presented today for surgery, with the diagnosis of STROKE  The various methods of treatment have been discussed with the patient and family. After consideration of risks, benefits and other options for treatment, the patient has consented to  Procedure(s): TRANSESOPHAGEAL ECHOCARDIOGRAM (TEE) (N/A) as a surgical intervention .  The patient's history has been reviewed, patient examined, no change in status, stable for surgery.  I have reviewed the patient's chart and labs.  Questions were answered to the patient's satisfaction.     Taige Housman [employer]

## 2014-04-19 NOTE — CV Procedure (Signed)
SURGEON:  Thompson Grayer, MD     PREPROCEDURE DIAGNOSIS:  Cryptogenic Stroke    POSTPROCEDURE DIAGNOSIS:  Cryptogenic Stroke     PROCEDURES:   1. Implantable loop recorder implantation    INTRODUCTION:  Danielle [name] is a 64 y.o. female with a history of unexplained stroke who presents today for implantable loop implantation.  The patient has had a cryptogenic stroke.  Despite an extensive workup by neurology, no reversible causes have been identified.  she has worn telemetry during which she did not have arrhythmias.  There is significant concern for possible atrial fibrillation as the cause for the patients stroke.  The patient therefore presents today for implantable loop implantation.     DESCRIPTION OF PROCEDURE:  Informed written consent was obtained, and the patient was brought to the electrophysiology lab in a fasting state.  The patient required no sedation for the procedure today.  Mapping over the patient's chest was performed by the EP lab staff to identify the area where electrograms were most prominent for ILR recording.  This area was found to be the left parasternal region over the 3rd-4th intercostal space. The patients left chest was therefore prepped and draped in the usual sterile fashion by the EP lab staff. The skin overlying the left parasternal region was infiltrated with lidocaine for local analgesia.  A 0.5-cm incision was made over the left parasternal region over the 3rd intercostal space.  A subcutaneous ILR pocket was fashioned using a combination of sharp and blunt dissection.  A Medtronic Reveal [place] model [id number] implantable loop recorder was then placed into the pocket  R waves were very prominent and measured 0.61mV. EBL<1 ml.  Steri- Strips and a sterile dressing were then applied.  There were no early apparent complications.     CONCLUSIONS:   1. Successful implantation of a Medtronic Reveal LINQ implantable loop recorder for cryptogenic stroke  2.  No early apparent complications.   Thompson Grayer MD, [facility] 04/19/2014 2:13 PM

## 2014-04-19 NOTE — H&P (View-Only) (Signed)
STROKE TEAM PROGRESS NOTE   SUBJECTIVE (INTERVAL HISTORY) Her daughter and friend are at the bedside.  Overall she feels her condition is rapidly improving. She still has mild word finding difficulties. She stated that she had lung cancer 9 years ago, currently stable and still receiving maintenance chemotherapy. She had brain metastases status post, knife 7 years ago and one year ago. She also had left heat metastasis status post radiation. Discussed with Dr. Rogue Jury, her lung cancer with metastasis are all stable.   OBJECTIV daughter and friend E Temp:  [97.9 F (36.6 C)-98.3 F (36.8 C)] 98.1 F (36.7 C) (04/11 0958) Pulse Rate:  [69-88] 75 (04/11 0958) Cardiac Rhythm:  [-] Normal sinus rhythm (04/11 0849) Resp:  [18] 18 (04/11 0958) BP: (133-146)/(61-80) 146/78 mmHg (04/11 0958) SpO2:  [98 %-100 %] 100 % (04/11 0958)   Recent Labs Lab 04/16/14 2002  GLUCAP 121*    Recent Labs Lab 04/12/14 0916 04/16/14 2024 04/17/14 0455 04/18/14 0907  NA 140 136 137 140  K 4.4 3.8 4.1 3.7  CL  --  99 102 105  CO2 25 26 27 24   GLUCOSE 109 132* 170* 178*  BUN 9.1 35* 33* 19  CREATININE 0.9 1.73* 1.23* 0.75  CALCIUM 9.1 9.3 9.0 9.4    Recent Labs Lab 04/12/14 0916 04/16/14 2024  AST 15 24  ALT 9 21  ALKPHOS 76 60  BILITOT 0.29 0.8  PROT 6.6 6.7  ALBUMIN 3.1* 3.7    Recent Labs Lab 04/12/14 0916 04/16/14 2024 04/17/14 0455  WBC 7.7 11.9* 8.6  NEUTROABS 4.2  --   --   HGB 10.9* 12.0 12.0  HCT 33.8* 37.7 37.0  MCV 93.4 92.6 92.0  PLT 351 447* 407*   No results for input(s): CKTOTAL, CKMB, CKMBINDEX, TROPONINI in the last 168 hours. No results for input(s): LABPROT, INR in the last 72 hours.  Recent Labs  04/17/14 0019  COLORURINE YELLOW  LABSPEC 1.014  PHURINE 5.5  GLUCOSEU NEGATIVE  HGBUR TRACE*  BILIRUBINUR NEGATIVE  KETONESUR NEGATIVE  PROTEINUR NEGATIVE  UROBILINOGEN 0.2  NITRITE NEGATIVE  LEUKOCYTESUR SMALL*       Component Value Date/Time   CHOL 173 04/18/2014 0907   TRIG 116 04/18/2014 0907   HDL 45 04/18/2014 0907   CHOLHDL 3.8 04/18/2014 0907   VLDL 23 04/18/2014 0907   LDLCALC 105* 04/18/2014 0907   Lab Results  Component Value Date   HGBA1C 5.8 11/12/2013   No results found for: LABOPIA, COCAINSCRNUR, LABBENZ, AMPHETMU, THCU, LABBARB  No results for input(s): ETH in the last 168 hours.  I have personally reviewed the radiological images below and agree with the radiology interpretations.  Dg Chest 2 View  04/18/2014    IMPRESSION: No active cardiopulmonary disease.    Ct Head (brain) Wo Contrast  04/16/2014   IMPRESSION: Subtle area of low attenuation in the left frontal lobe. This could represent focal ischemia or metastasis. Suggest MRI for further evaluation. No acute intracranial hemorrhage. No significant mass effect.     Mr Jeri Cos Wo Contrast  04/17/2014   IMPRESSION:  Moderate-size acute hemorrhagic anterior left frontal lobe infarct with extension to the anterior left peri operculum region and subinsular region. Mild local mass effect.  Small acute scattered right parietal lobe infarcts.  Remote small right lenticular nucleus infarct.  No intracranial enhancing lesion or bony destructive lesion to suggest the presence of intracranial metastatic disease.  Paranasal sinus mucosal thickening/ partial opacification most notable maxillary sinuses, ethmoid  sinus air cells and right sphenoid sinus.    US Renal  04/17/2014   IMPRESSION: Normal renal ultrasound. No evidence of hydronephrosis. Decompressed bladder.     Mra Head/brain Wo Cm  04/18/2014   IMPRESSION: No large vessel occlusion. Decreased number of distal LEFT middle cerebral artery branches.  LEFT frontal lobe hemorrhage versus enhancement partially imaged.      Carotid Doppler  Bilateral: 1-39% ICA stenosis. Vertebral artery flow is antegrade.  2D Echocardiogram  - Left ventricle: The cavity size was normal. Wall thickness was normal. Systolic  function was normal. The estimated ejection fraction was in the range of 55% to 60%. Wall motion was normal; there were no regional wall motion abnormalities. Left ventricular diastolic function parameters were normal.  UE and LE venous Doppler - negative for DVT  EEG - This is an abnormal electroencephalogram secondary to slowing and sharp activity over the left frontotemporal region consistent with the patient's history of a frontal lobe hemorrhage. Also noted were periods of rhythmical bifrontal sharp activity that are concerning for electrographic and electroclinical seizure activity.   TEE pending  PHYSICAL EXAM  Temp:  [97.9 F (36.6 C)-98.3 F (36.8 C)] 98.1 F (36.7 C) (04/11 0958) Pulse Rate:  [69-88] 75 (04/11 0958) Resp:  [18] 18 (04/11 0958) BP: (133-146)/(61-80) 146/78 mmHg (04/11 0958) SpO2:  [98 %-100 %] 100 % (04/11 0958)  General - Well nourished, well developed, in no apparent distress.  Ophthalmologic - Sharp disc margins OU.  Cardiovascular - Regular rate and rhythm with no murmur.  Neck - supple, no carotid bruits  Mental Status -  Level of arousal and orientation to person were intact, but not orientated to place, time, and president. Language examination showed partial expressive aphasia, able to repeat, able to name 2/3, follows commands. Attention span and concentration were impaired, not able to backward spelling WORLD. Fund of Knowledge was assessed and was impaired, not knowing previous presidents.  Cranial Nerves II - XII - II - Visual field intact OU. III, IV, VI - Extraocular movements intact. V - Facial sensation intact bilaterally. VII - Facial movement intact bilaterally. VIII - Hearing & vestibular intact bilaterally. X - Palate elevates symmetrically. XI - Chin turning & shoulder shrug intact bilaterally. XII - Tongue protrusion intact.  Motor Strength - The patient's strength was normal in all extremities and pronator drift  was absent.  Bulk was normal and fasciculations were absent.   Motor Tone - Muscle tone was assessed at the neck and appendages and was normal.  Reflexes - The patient's reflexes were symmetrical in all extremities and she had no pathological reflexes.  Sensory - Light touch, temperature/pinprick were assessed and were symmetrical.    Coordination - The patient had normal movements in the hands and feet with no ataxia or dysmetria.  Tremor was absent.  Gait and Station - not tested due to fatigue.   ASSESSMENT/PLAN Danielle [name] is a 64 y.o. female with history of lung cancer 9 years ago on maintenance chemotherapy, brain metastasis 2 status post radiation, metastasis to left keep status post radiation admitted for aphasia. Symptoms improved.    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 Hypercoagulable state due to malignancy has to be considered, however, I discussed with Dr. Rogue Jury, her oncologist, over the phone, and it seems her malignancy is rather stable at this stage. Will need continue to look for other etiologies.  MRI  left Broca's area and right parietal infarcts   MRA  decreased left MCA branches   Carotid Doppler  unremarkable   2D Echo  unremarkable   Venous Doppler - no DVT upper and lower extremities   LDL 105, not at goal   HgbA1c pending  Recommend TEE for further evaluation of stroke etiologies. May consider loop recorder if TEE negative depending on EP considerations.  SCDs  for VTE prophylaxis  Diet regular Room service appropriate?: Yes; Fluid consistency:: Thin   no antithrombotic prior to admission, now on aspirin 325 mg orally every day  Patient counseled to be compliant with her antithrombotic medications  Ongoing aggressive stroke risk factor management  Therapy recommendations:  Pending   Disposition:  Pending  Abnormal EEG  Bifrontal  sharps  Concerning for lower the seizure threshold in patient with brain metastases  Recommend treatment with vimpat   Vimpat 50 mg twice a day added  Lung cancer   9 years history currently on maintenance chemotherapy  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 Rogue Jury  Hypertension  Home meds:   Amlodipine, lisinopril  Permissive hypertension (OK if <220/120) for 24-48 hours post stroke and then gradually normalized within 5-7 days. Currently on amlodipine,   Stable  Patient counseled to be compliant with her blood pressure medications  Hyperlipidemia  Home meds:  Simvastatin 20   Currently on Lipitor 20   LDL 105, goal < 70  Continue lipitor at discharge  Other Stroke Risk Factors  Former smoker  Hypercoagulable state due to malignancy  Other Active Problems  Thrombocytosis  Elevated creatinine  Other Pertinent History    Hospital day # 2  Rosalin Hawking, MD PhD Stroke Neurology 04/18/2014 5:46 PM    To contact Stroke Continuity provider, please refer to [email or web address] After hours, contact General Neurology

## 2014-04-19 NOTE — CV Procedure (Signed)
Procedure: TEE  Indication: CVA  Sedation: Versed 8 mg IV, Fentanyl 75 mcg IV  Findings: Please see echo section for full report.  Normal LV size and systolic function, EF 48-54%.  Normal regional wall motion.  Normal RV size and systolic function.  Trivial MR.  Trivial TR, peak RV-RA gradient 37 mmHg.  Trileaflet aortic valve with no AS or AI.  Normal atrial sizes with no LAA thrombus.  Catheter ends in right atrium, no clot or vegetation.  Negative bubble study, no evidence for PFO or ASD.  Normal caliber aorta, grade II plaque descending thoracic aorta.   No source of embolus.   Danielle [name] 04/19/2014 11:24 AM

## 2014-04-19 NOTE — Consult Note (Signed)
ELECTROPHYSIOLOGY CONSULT NOTE  Patient ID: Danielle [name] MRN: [id number], DOB/AGE: Feb 06, 1950   Admit date: 04/16/2014 Date of Consult: 04/19/2014  Primary Physician: Danielle Lofts, MD Primary Cardiologist: Danielle [name] Reason for Consultation: Cryptogenic stroke; recommendations regarding Implantable Loop Recorder  History of Present Illness Danielle [name] was admitted on 04/16/2014 with slurred speech and expressive aphasia.  Imaging demonstrated dominant left MCA infarcts felt to be embolic.  She has undergone workup for stroke including echocardiogram and carotid dopplers.  The patient has been monitored on telemetry which has demonstrated sinus rhythm with no arrhythmias.  Inpatient stroke work-up is to be completed with a TEE.   Echocardiogram this admission demonstrated EF 60%, LA 34.  Lab work is reviewed.    Prior to admission, the patient denies chest pain, shortness of breath, dizziness, palpitations, or syncope.   EP has been asked to evaluate for placement of an implantable loop recorder to monitor for atrial fibrillation.  ROS is negative except as outlined above.    Past Medical History  Diagnosis Date  . Hypertension   . Anemia   . H/O: pneumonia   . History of tobacco abuse quit 10/08    on nicotine patch  . Fibromyalgia   . Hypokalemia   . DJD (degenerative joint disease), cervical   . Thrush 12/11/2010  . 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 had chemo and radiation for lung ca     Surgical History:  Past Surgical History  Procedure Laterality Date  . Cervical laminectomy  1995  . Other surgical history  2008    Gamma knife surgery to remove brain met   . 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 Knee surgery  1990    Left x 2  . Portacath placement  -Danielle [name]    TIP IN CAVOATRIAL JUNCTION     Prescriptions prior to admission  Medication Sig Dispense Refill Last Dose  .  ALPRAZolam (XANAX) 0.25 MG tablet Take 1 tablet (0.25 mg total) by mouth at bedtime as needed for sleep. (Patient taking differently: Take 0.25 mg by mouth at bedtime. ) 30 tablet 0 04/15/2014 at Unknown time  . amLODipine (NORVASC) 10 MG tablet Take 1 tablet (10 mg total) by mouth daily. 90 tablet 3 04/16/2014 at Unknown time  . fluticasone (FLONASE) 50 MCG/ACT nasal spray USE 1-2 SPRAYS IN EACH NOSTRIL DAILY (Patient taking differently: USE 1-2 SPRAYS IN EACH NOSTRIL DAILY PRN CONGESTION) 16 g 11 few weeks ago at Unknown time  . folic acid (FOLVITE) 1 MG tablet TAKE 1 TABLET BY MOUTH DAILY. 90 tablet 1 04/16/2014 at Unknown time  . lidocaine-prilocaine (EMLA) cream Apply topically as needed. (Patient taking differently: Apply 1 application topically See admin instructions. Apply 1 hour before chemo treatments (every 3 weeks)) 30 g 1 04/12/2014 at Unknown time  . lisinopril (PRINIVIL,ZESTRIL) 20 MG tablet TAKE 2 TABLETS (40 MG TOTAL) BY MOUTH DAILY. (Patient taking differently: TAKE 1 TABLET (20MG TOTAL) BY MOUTH DAILY AT BEDTIME) 60 tablet 5 04/15/2014 at Unknown time  . morphine (MS CONTIN) 60 MG 12 hr tablet Take 60 mg by mouth 2 (two) times daily.  0 04/16/2014 at 600  . morphine (MSIR) 30 MG tablet Take 1 tablet (30 mg total) by mouth every 4 (four) hours as needed. (Patient taking differently: Take 30 mg by mouth every 4 (four) hours as needed (breakthrough  pain). ) 60 tablet 0 04/16/2014 at noon  . olopatadine (PATANOL) 0.1 % ophthalmic solution Place 1 drop into both eyes 2 (two) times daily. 5 mL 11 04/16/2014 at am  . ondansetron (ZOFRAN) 8 MG tablet TAKE 1 TABLET BY MOUTH EVERY 8 HOURS AS NEEDED FOR NAUSEA. (Patient taking differently: Take 8 mg by mouth 2 (two) times daily as needed for nausea or vomiting. ) 30 tablet 2 04/16/2014 at am  . predniSONE (DELTASONE) 50 MG tablet Take 50 mg by mouth See admin instructions. Take 1 tablet (50 mg) 13 hours before CT scan, 7 hours before CT scan and 1 hour before CT  scan (every 9 weeks)   approx 2 months ago  . simvastatin (ZOCOR) 20 MG tablet Take 1 tablet (20 mg total) by mouth daily. 90 tablet 3 04/16/2014 at Unknown time    Inpatient Medications:  . amLODipine  10 mg Oral Daily  . aspirin EC  325 mg Oral Daily  . atorvastatin  20 mg Oral q1800  . cefTRIAXone (ROCEPHIN)  IV  1 g Intravenous Q24H  . dexamethasone  4 mg Oral 4 times per day  . lacosamide  50 mg Oral BID  . morphine  60 mg Oral Q12H  . olopatadine  1 drop Both Eyes BID    Allergies:  Allergies  Allergen Reactions  . Contrast Media [Iodinated Diagnostic Agents] Hives  . Iohexol Hives     pt needs 13 hour prep--prev hives; added allergy 06/02/07 slg   . Sulfa Drugs Cross Reactors Hives  . Co-Trimoxazole Injection [Sulfamethoxazole-Trimethoprim] Rash    History   Social History  . Marital Status: Divorced    Spouse Name: N/A  . Number of Children: 3  . Years of Education: N/A   Occupational History  . DISABLED    Social History Main Topics  . Smoking status: Former Smoker -- 2.00 packs/day for 30 years    Quit date: 10/07/2005  . Smokeless tobacco: Never Used  . Alcohol Use: No  . Drug Use: No  . Sexual Activity: Not on file   Other Topics Concern  . Not on file   Social History Narrative   Divorced (alcoholic)    Regular exercise: yes, walking   Diet: fruits and veggies, walking    No living will,  HCPOA (son Danielle [name]), DNR (reviewed 2014)                 Family History  Problem Relation Age of Onset  . Diabetes Father   . Lymphoma Father   . Cancer Father   . Liver cancer Mother   . Lung cancer Mother   . Cancer Mother   . Hyperlipidemia Brother   . Healthy Daughter   . Healthy Son   . Healthy Son   . Cancer Sister 25    ovarian cancer     Physical Exam: Filed Vitals:   04/18/14 0958 04/18/14 2136 04/19/14 0218 04/19/14 0504  BP: 146/78 147/58 142/73 159/61  Pulse: 75   81  Temp: 98.1 F (36.7 C) 97.9 F (36.6 C) 98 F (36.7  C) 97.5 F (36.4 C)  TempSrc: Oral Oral Oral Oral  Resp: '18 20 20 16  ' Height:      Weight:      SpO2: 100% 100% 100% 100%    GEN- The patient is well appearing, alert   Head- normocephalic, atraumatic Eyes-  Sclera clear, conjunctiva pink Ears- hearing intact Oropharynx- clear Neck- supple, Lungs- Clear  to ausculation bilaterally, normal work of breathing Heart- Regular rate and rhythm, no murmurs, rubs or gallops  GI- soft, NT, ND, + BS Extremities- no clubbing, cyanosis, or edema MS- no significant deformity or atrophy Skin- no rash or lesion Psych- euthymic mood, full affect   Labs:   Lab Results  Component Value Date   WBC 8.6 04/17/2014   HGB 12.0 04/17/2014   HCT 37.0 04/17/2014   MCV 92.0 04/17/2014   PLT 407* 04/17/2014    Recent Labs Lab 04/16/14 2024  04/18/14 0907  NA 136  < > 140  K 3.8  < > 3.7  CL 99  < > 105  CO2 26  < > 24  BUN 35*  < > 19  CREATININE 1.73*  < > 0.75  CALCIUM 9.3  < > 9.4  PROT 6.7  --   --   BILITOT 0.8  --   --   ALKPHOS 60  --   --   ALT 21  --   --   AST 24  --   --   GLUCOSE 132*  < > 178*  < > = values in this interval not displayed. No results found for: CKTOTAL, CKMB, CKMBINDEX, TROPONINI   Radiology/Studies: Dg Chest 2 View 04/18/2014   CLINICAL DATA:  Stroke protocol  EXAM: CHEST  2 VIEW  COMPARISON:  Chest CT 12/24/2013  FINDINGS: Right IJ porta catheter is in good position, tip at the upper cavoatrial junction. Normal heart size and aortic contours.  There is chronic reticulation in the right upper lung. No edema, consolidation, effusion, or pneumothorax. No acute osseous findings. Patient status post 2 level thoracic vertebral body augmentation with cement.  IMPRESSION: No active cardiopulmonary disease.   Electronically Signed   By: Monte Fantasia M.D.   On: 04/18/2014 04:15   Ct Head (brain) Wo Contrast 04/16/2014   CLINICAL DATA:  Altered mental status and slurred speech. Last seen baseline at approximately noon  today. Began noticing altered status at 15:00 today. Weakness, slurred speech, unsteady on feet, confusion. History of lung cancer.  EXAM: CT HEAD WITHOUT CONTRAST  TECHNIQUE: Contiguous axial images were obtained from the base of the skull through the vertex without intravenous contrast.  COMPARISON:  MRI brain 10/09/2006  FINDINGS: There is subtle low-attenuation change with loss of gray-white matter distinction in the left frontal lobe. Area of involvement measures about 2.6 x 3.1 cm. Appearance may represent focal ischemia or metastasis. Suggest MRI for further evaluation. Ventricles and sulci are otherwise symmetrical. No ventricular dilatation. No abnormal extra-axial fluid collections. Basal cisterns are not effaced. No acute intracranial hemorrhage. Mucosal thickening throughout the paranasal sinuses with opacification in some of the ethmoid air cells. Mastoid air cells are not opacified. Calvarium appears intact.  IMPRESSION: Subtle area of low attenuation in the left frontal lobe. This could represent focal ischemia or metastasis. Suggest MRI for further evaluation. No acute intracranial hemorrhage. No significant mass effect.   Electronically Signed   By: Lucienne Capers M.D.   On: 04/16/2014 22:09   Mr Jeri Cos TM Contrast 04/18/2014   ADDENDUM REPORT: 04/18/2014 19:07  ADDENDUM: Patient's prior exams have become available. No significant change from initial dictation.   Electronically Signed   By: Genia Del M.D.   On: 04/18/2014 19:07   04/18/2014   CLINICAL DATA:  64 year old hypertensive female with sudden onset of altered mental status, slurred speech and unsteady gait. Lung cancer with metastatic disease post gamma knife surgery.  Subsequent encounter.  EXAM: MRI HEAD WITHOUT AND WITH CONTRAST  TECHNIQUE: Multiplanar, multiecho pulse sequences of the brain and surrounding structures were obtained without and with intravenous contrast.  CONTRAST:  22m MULTIHANCE GADOBENATE DIMEGLUMINE 529 MG/ML  IV SOLN  COMPARISON:  04/16/2014 head CT. Report from 10/09/2006 MR. Images not available currently.  FINDINGS: This exam was interpreted during a PACS downtime with limited availability of comparison cases. It has been flagged for review following the downtime. If clinically indicated after this review, an addendum will be issued providing details about comparison to prior imaging.  Moderate-size acute hemorrhagic anterior left frontal lobe infarct with extension to the anterior left peri operculum region and subinsular region. Mild local mass effect.  Small acute scattered right parietal lobe infarcts.  Remote small right lenticular nucleus infarct.  No intracranial enhancing lesion or bony destructive lesion to suggest the presence of intracranial metastatic disease.  No hydrocephalus.  Vertebral arteries, basilar artery and internal carotid arteries as well as major dural sinuses are patent.  Slightly small pituitary gland. Pineal region and orbital structures unremarkable.  Paranasal sinus mucosal thickening/ partial opacification most notable maxillary sinuses, ethmoid sinus air cells and right sphenoid sinus.  IMPRESSION: This exam was interpreted during a PACS downtime with limited availability of comparison cases. It has been flagged for review following the downtime. If clinically indicated after this review, an addendum will be issued providing details about comparison to prior imaging.  Moderate-size acute hemorrhagic anterior left frontal lobe infarct with extension to the anterior left peri operculum region and subinsular region. Mild local mass effect.  Small acute scattered right parietal lobe infarcts.  Remote small right lenticular nucleus infarct.  No intracranial enhancing lesion or bony destructive lesion to suggest the presence of intracranial metastatic disease.  Paranasal sinus mucosal thickening/ partial opacification most notable maxillary sinuses, ethmoid sinus air cells and right sphenoid  sinus.  These results were called by telephone at the time of interpretation on 04/17/2014 at 11:08 am to [facility], who verbally acknowledged these results. Request to call physician on call with results.  Electronically Signed: By: SGenia DelM.D. On: 04/17/2014 11:19   12-lead ECG sinus rhythm, rate 95, normal intervals  Telemetry sinus rhythm  Assessment and Plan:  1. Cryptogenic stroke The patient presents with cryptogenic stroke.  The patient has a TEE planned for this AM. Dr ARayann Hemanspoke at length with the patient about monitoring for afib with an implantable loop recorder.  Risks, benefits, and alteratives to implantable loop recorder were discussed with the patient today.   At this time, the patient is very clear in their decision to proceed with implantable loop recorder.   Wound care was reviewed with the patient (keep incision clean and dry for 3 days).  Wound check scheduled for 04-28-14 at 3:30PM.  Please call with questions.   AChanetta Marshall NP 04/19/2014 8:03 AM  I have seen, examined the patient, and reviewed the above assessment and plan.  Changes to above are made where necessary.  Will proceed with ILR placement if TEE is unrevealing.  Per Neurology, discussions with Oncology suggest that pt could be anticoagulated if needed.  Co Sign: JThompson Grayer MD 04/19/2014 10:21 AM

## 2014-04-20 ENCOUNTER — Other Ambulatory Visit: Payer: Self-pay | Admitting: Neurology

## 2014-04-20 ENCOUNTER — Telehealth: Payer: Self-pay | Admitting: Neurology

## 2014-04-20 ENCOUNTER — Encounter (HOSPITAL_COMMUNITY): Payer: Self-pay | Admitting: Internal Medicine

## 2014-04-20 DIAGNOSIS — I639 Cerebral infarction, unspecified: Secondary | ICD-10-CM

## 2014-04-20 NOTE — Telephone Encounter (Signed)
Will forward to Dr. Erlinda Hong.  I do not see re: therapy in dc summary or neuro note.

## 2014-04-20 NOTE — Telephone Encounter (Signed)
Angie [phone]) from OT/PT next door  would like an order from Dr. Erlinda Hong  for PT/OT. The patient has a follow-up in July with Dr. Erlinda Hong and when discharged she was to follow up with them but there is no order. Please call Angie at [phone]

## 2014-04-20 NOTE — Telephone Encounter (Signed)
Patient was not seen by PT or OT while she was in hospital. However, I'm okay with PT OT outpatient. I have already put an order for PT and OT.  Rosalin Hawking, MD PhD Stroke Neurology 04/20/2014 5:11 PM

## 2014-04-21 NOTE — Telephone Encounter (Signed)
Appt for PT/OT scheduled for this coming Tuesday.

## 2014-04-25 ENCOUNTER — Telehealth: Payer: Self-pay | Admitting: *Deleted

## 2014-04-25 NOTE — Telephone Encounter (Signed)
Pt sister called with concern regarding pt scan on Friday. Pt was recently in hospital due to altered mental status and had scans.  " will she need the scan on Friday or did they do it in the hospital" Will review with MD, call pts sister back with additional information.  Appt for CT CAP 4/22

## 2014-04-25 NOTE — Telephone Encounter (Signed)
She still needs a CT scan of the chest, abdomen and pelvis. Her previous imaging studies were scans of the brain.

## 2014-04-26 ENCOUNTER — Ambulatory Visit: Payer: Medicare Other | Attending: Neurology | Admitting: Occupational Therapy

## 2014-04-26 ENCOUNTER — Encounter: Payer: Self-pay | Admitting: Family Medicine

## 2014-04-26 ENCOUNTER — Ambulatory Visit (INDEPENDENT_AMBULATORY_CARE_PROVIDER_SITE_OTHER): Payer: Medicare Other | Admitting: Family Medicine

## 2014-04-26 ENCOUNTER — Telehealth: Payer: Self-pay | Admitting: *Deleted

## 2014-04-26 ENCOUNTER — Ambulatory Visit: Payer: Medicare Other

## 2014-04-26 VITALS — BP 129/68 | HR 90 | Temp 98.3°F | Ht 63.0 in | Wt 155.2 lb

## 2014-04-26 DIAGNOSIS — IMO0002 Reserved for concepts with insufficient information to code with codable children: Secondary | ICD-10-CM

## 2014-04-26 DIAGNOSIS — E7800 Pure hypercholesterolemia, unspecified: Secondary | ICD-10-CM

## 2014-04-26 DIAGNOSIS — I6980 Unspecified sequelae of other cerebrovascular disease: Secondary | ICD-10-CM | POA: Diagnosis not present

## 2014-04-26 DIAGNOSIS — R29898 Other symptoms and signs involving the musculoskeletal system: Secondary | ICD-10-CM | POA: Diagnosis not present

## 2014-04-26 DIAGNOSIS — R4701 Aphasia: Secondary | ICD-10-CM | POA: Insufficient documentation

## 2014-04-26 DIAGNOSIS — I639 Cerebral infarction, unspecified: Secondary | ICD-10-CM

## 2014-04-26 DIAGNOSIS — R279 Unspecified lack of coordination: Secondary | ICD-10-CM | POA: Diagnosis not present

## 2014-04-26 DIAGNOSIS — N3000 Acute cystitis without hematuria: Secondary | ICD-10-CM

## 2014-04-26 DIAGNOSIS — E119 Type 2 diabetes mellitus without complications: Secondary | ICD-10-CM | POA: Diagnosis not present

## 2014-04-26 DIAGNOSIS — N179 Acute kidney failure, unspecified: Secondary | ICD-10-CM

## 2014-04-26 DIAGNOSIS — N390 Urinary tract infection, site not specified: Secondary | ICD-10-CM | POA: Insufficient documentation

## 2014-04-26 LAB — COMPREHENSIVE METABOLIC PANEL
ALBUMIN: 3.7 g/dL (ref 3.5–5.2)
ALT: 10 U/L (ref 0–35)
AST: 15 U/L (ref 0–37)
Alkaline Phosphatase: 57 U/L (ref 39–117)
BUN: 17 mg/dL (ref 6–23)
CALCIUM: 9.5 mg/dL (ref 8.4–10.5)
CHLORIDE: 103 meq/L (ref 96–112)
CO2: 29 mEq/L (ref 19–32)
Creatinine, Ser: 0.67 mg/dL (ref 0.40–1.20)
GFR: 94.87 mL/min (ref >60.00)
Glucose, Bld: 108 mg/dL — ABNORMAL HIGH (ref 70–99)
POTASSIUM: 4.2 meq/L (ref 3.5–5.1)
Sodium: 135 mEq/L (ref 135–145)
Total Bilirubin: 0.4 mg/dL (ref 0.2–1.2)
Total Protein: 6.9 g/dL (ref 6.0–8.3)

## 2014-04-26 LAB — CBC WITH DIFFERENTIAL/PLATELET
Basophils Absolute: 0 10*3/uL (ref 0.0–0.1)
Basophils Relative: 0.1 % (ref 0.0–3.0)
EOS ABS: 0 10*3/uL (ref 0.0–0.7)
Eosinophils Relative: 0.2 % (ref 0.0–5.0)
HCT: 34 % — ABNORMAL LOW (ref 36.0–46.0)
Hemoglobin: 11.5 g/dL — ABNORMAL LOW (ref 12.0–15.0)
Lymphocytes Relative: 18.1 % (ref 12.0–46.0)
Lymphs Abs: 2 10*3/uL (ref 0.7–4.0)
MCHC: 33.9 g/dL (ref 30.0–36.0)
MCV: 87.9 fl (ref 78.0–100.0)
MONO ABS: 2.5 10*3/uL — AB (ref 0.1–1.0)
Monocytes Relative: 23 % — ABNORMAL HIGH (ref 3.0–12.0)
NEUTROS PCT: 58.6 % (ref 43.0–77.0)
Neutro Abs: 6.5 10*3/uL (ref 1.4–7.7)
PLATELETS: 233 10*3/uL (ref 150.0–400.0)
RBC: 3.87 Mil/uL (ref 3.87–5.11)
RDW: 15.8 % — ABNORMAL HIGH (ref 11.5–15.5)
WBC: 11 10*3/uL — ABNORMAL HIGH (ref 4.0–10.5)

## 2014-04-26 MED ORDER — ATORVASTATIN CALCIUM 40 MG PO TABS: 40.0000 mg | ORAL_TABLET | Freq: Every day | ORAL | Status: DC

## 2014-04-26 NOTE — Assessment & Plan Note (Signed)
Stable control during hospitalization.

## 2014-04-26 NOTE — Therapy (Signed)
[facility] [address] [place], [place], [address] Phone: [phone]   Fax:  [phone]  Occupational Therapy Evaluation  Patient Details  Name: Danielle [name] MRN: [id number] Date of Birth: Mar 11, 1950 Referring Provider:  Rosalin Hawking, MD  Encounter Date: 04/26/2014      OT End of Session - 04/26/14 1253    Visit Number 1   Authorization Type MCR   OT Start Time 0850   OT Stop Time 0932   OT Time Calculation (min) 42 min   Activity Tolerance Patient tolerated treatment well      Past Medical History  Diagnosis Date  . Hypertension   . Anemia   . H/O: pneumonia   . History of tobacco abuse quit 10/08    on nicotine patch  . Fibromyalgia   . Hypokalemia   . DJD (degenerative joint disease), cervical   . Thrush 12/11/2010  . 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 had chemo and radiation for lung ca    Past Surgical History  Procedure Laterality Date  . Cervical laminectomy  1995  . Other surgical history  2008    Gamma knife surgery to remove brain met   . 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 Knee surgery  1990    Left x 2  . Portacath placement  -[name]    TIP IN CAVOATRIAL JUNCTION  . Loop recorder implant N/A 04/19/2014    Procedure: LOOP RECORDER IMPLANT;  Surgeon: Thompson Grayer, MD;  Location: [facility] CATH LAB;  Service: Cardiovascular;  Laterality: N/A;  . Tee without cardioversion N/A 04/19/2014    Procedure: TRANSESOPHAGEAL ECHOCARDIOGRAM (TEE);  Surgeon: Larey Dresser, MD;  Location: [facility];  Service: Cardiovascular;  Laterality: N/A;    There were no vitals filed for this visit.  Visit Diagnosis:  Muscular deconditioning - Plan: Ot plan of care cert/re-cert      Subjective Assessment - 04/26/14 0851    Subjective  Pt had 2 strokes on 04/16/14 per family report   Patient is accompained by: Family member   Pertinent  History Active CA (lung CA which has mestatisized to brain, spine, and stomach), CVA 2 yrs ago with no residual deficits   Currently in Pain? No/denies  Pt does have back pain related to CA, O.T. not addressing directly secondary to unrelated to CVA, being medically managed           [facility] OT Assessment - 04/26/14 0856    Assessment   Diagnosis CVA (Lt frontal and Rt parietal lobes)   Onset Date 04/16/14   Prior Therapy none   Precautions   Precautions Fall  heart monitor    Precaution Comments no driving, 24 hr. supervision   Balance Screen   Has the patient fallen in the past 6 months No   Has the patient had a decrease in activity level because of a fear of falling?  No   Is the patient reluctant to leave their home because of a fear of falling?  No   Home  Environment   Family/patient expects to be discharged to: Private residence   Living Arrangements Other relatives   Available Help at Discharge Family   Type of [place] One level   Bathroom Shower/Tub [place] - 2 wheels;Bedside commode  Lives With Son  daughter n law, grandchildren with 4 steps to enter   Prior Function   Level of Independence Independent with basic ADLs  Dtr-n-law performed all IADLS d/t pt's CA   Vocation On disability   ADL   ADL comments Pt still performing BADLS with slightly increased time   Mobility   Mobility Status Independent   Written Expression   Dominant Hand Right   Handwriting 100% legible   Vision - History   Baseline Vision Bifocals   Additional Comments Rt eye laterally deviates (premorbid secondary to tumors)   Activity Tolerance   Activity Tolerance Tolerates 10-20 min activity with muiltiple rests   Cognition   Overall Cognitive Status History of cognitive impairments - at baseline  long term deficits d/t CA, (CVA affected short term memory)   Mini Mental State Exam  --  can spell "world" backwards  with difficulty d/t aphasia   Sensation   Additional Comments denies change   Coordination   9 Hole Peg Test Right;Left   Right 9 Hole Peg Test 20.82 SEC   Left 9 Hole Peg Test 21.56 SEC   ROM / Strength   AROM / PROM / Strength AROM   AROM   Overall AROM Comments BUE AROM WFL's, however limited with extreme ER and circumduction motion due to back pain from CA.    Hand Function   Right Hand Grip (lbs) 38 LBS   Left Hand Grip (lbs) 35 LBS                                     Plan - 05-15-14 1254    Clinical Impression Statement Pt is a 64 y.o. female who presents to outpatient rehab s/p CVA with no residual deficits in ability to perform self care needs, coordination or ROM. Pt does demo generalized muscle weakness however this is premorbid due to active CA for several years.    Pt will benefit from skilled therapeutic intervention in order to improve on the following deficits (Retired) --  n/a for O.T.    OT Frequency One time visit   Plan N/A, no f/u O.T. recommended   Recommended Other Services Speech therapy consult for mild aphasia and ? mild cognitive deficits   Consulted and Agree with Plan of Care Patient;Family member/caregiver          G-Codes - May 15, 2014 1301    Functional Assessment Tool Used FIM   Functional Limitation Self care   Self Care Current Status [phone]) At least 1 percent but less than 20 percent impaired, limited or restricted   Self Care Goal Status (Z0017) At least 1 percent but less than 20 percent impaired, limited or restricted   Self Care Discharge Status [phone]) At least 1 percent but less than 20 percent impaired, limited or restricted      Problem List Patient Active Problem List   Diagnosis Date Noted  . Brain lesion   . Expressive aphasia   . Cerebral infarction due to embolism of cerebral artery   . HLD (hyperlipidemia)   . Slurred speech 04/17/2014  . Acute encephalopathy 04/17/2014  . Acute CVA  (cerebrovascular accident) 04/17/2014  . Stroke 04/17/2014  . AKI (acute kidney injury) 04/16/2014  . Cancer-related pain 12/28/2013  . Atherosclerosis of aorta 08/13/2013  . Cancer of upper lobe of right lung 11/19/2010  . BENIGN POSITIONAL VERTIGO 06/13/2009  . CONSTIPATION 04/07/2009  .  Diabetes mellitus 09/29/2008  . HYPERCHOLESTEROLEMIA 09/29/2008  . INSOMNIA 02/22/2008  . ALLERGIC CONJUNCTIVITIS 01/20/2008  . PULMONARY NOCARDIOSIS 01/07/2008  . Essential hypertension 01/07/2008  . ANEMIA-NOS 03/31/2006  . LUNG CANCER, HX OF 03/31/2006  . PNEUMONIA, HX OF 03/31/2006  . LAMINECTOMY, HX OF 03/31/2006    Carey Bullocks, OTR/L 04/26/2014, 1:06 PM  [facility] [address] [place], [place], [address] Phone: [phone]   Fax:  [phone]

## 2014-04-26 NOTE — Assessment & Plan Note (Signed)
Pre renal. Collie Siad for re-eval. {Push fluids.

## 2014-04-26 NOTE — Patient Instructions (Addendum)
Increase lipitor (atorvastatin) to 40 mg daily. Stop at lab on way out.

## 2014-04-26 NOTE — Assessment & Plan Note (Signed)
Resolved

## 2014-04-26 NOTE — Assessment & Plan Note (Signed)
Not at goal considering CVA, goal LDL < 70.Increase lipitor  To 40 mg daily.

## 2014-04-26 NOTE — Progress Notes (Signed)
Pre visit review using our clinic review tool, if applicable. No additional management support is needed unless otherwise documented below in the visit note. 

## 2014-04-26 NOTE — Patient Instructions (Signed)
Walking Program:  Begin walking for exercise for 8 minutes, 1-2 times/day, 6 days/week.   Progress your walking program by adding 1 minutes to your routine each week, as tolerated. Be sure to wear good walking shoes, walk in a safe environment and only progress to your tolerance.   *be sure you have someone with you if walking outside of your house     FUNCTIONAL MOBILITY: Heel Walking   Hold counter if needed. Walk forward along your counter on heels. When you get to the end, walk backward on your heels. Perform 2 laps daily.    Tandem Walking   Hold counter as needed. Walk with each foot directly in front of other, heel of one foot touching toes of other foot with each step. Both feet straight ahead. When you get to the end, walk backward in the same manner. Perform 2 laps daily.     "I love a Parade" Lift--marching while walking   Using a counter if necessary, march forward while you walk to the end of the counter. Bring your knees as high as you can each time you march and hold each march for 2 seconds. When you get to the end of the counter, march backward. Perform 2 laps daily.   http://gt2.exer.us/344   Copyright  VHI. All rights reserved.

## 2014-04-26 NOTE — Telephone Encounter (Signed)
Called pts daughter advised pt will need to keep upcoming CT CAP scan. Caren Griffins verbalized understanding no further concerns.

## 2014-04-26 NOTE — Telephone Encounter (Signed)
Order for speech therapy placed per therapist's recommendation.

## 2014-04-26 NOTE — Progress Notes (Signed)
Subjective:    Patient ID: Danielle [name], female    DOB: 1950-10-17, 64 y.o.   MRN: [id number]  HPI   64 year old female with metastatic lung cancer presents for hosp follow up. She was admitted after ER visit for slurred speech and confusion. 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 Although improved,continued to have nonsensical speech at times.Transthoracic echocardiogram and trans-esophageal Echo negative for a embolic etiology. Carotid Doppler does not show significant stenosis. 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 Continue Aspirin on discharge. LDL not at goal, at 105-continue Lipitor  20 mg on discharge. A1C 6.1.  Seizure:EEG positive for rhythmical bifrontal sharp activity that are concerning for electrographic and electroclinical seizure activity. Started on Vimpat at the recommendation of Neurology  Admit Date: 04/16/2014 Discharge date: 04/19/2014   Recommendations for Outpatient Follow-up:  1. Recheck lipid panel and A1c in the next 3 months 2. Repeat CBC and chemistries at next few weeks. 3. Ensure follow-up with cardiology-loop recorder implanted prior to discharge  PRIMARY DISCHARGE DIAGNOSIS: Principal Problem:  Acute CVA (cerebrovascular accident): See above Active Problems:  Essential hypertension  Cancer of upper lobe of right lung  AKI (acute kidney injury):Resolved.. Suspect etiology was pre-renal azotemia. Renal ultrasound was negative for hydronephrosis. UTI: completed 3 day course of rocephin  Slurred speech  Acute encephalopathy  Stroke  Expressive aphasia  Cerebral infarction due to embolism of cerebral artery  HLD (hyperlipidemia)  Brain lesion  History of metastatic non-small cell lung cancer, with known metastatic brain tumor status post a stereotactic radiotherapy performed at  [facility] 2014: Was started on Decadron on admission-as CT scan done on admission showed possible metastases versus infarct. Follows with Dr. Julien Nordmann as an outpatient.   She reports that she has been doing well. She continues to have some aphasia, day to day memory decreased. Slightly better than admission.  Family is going everywhere with her.   Has appt with cardiology this Thursday. Has follow up with neurologist in next week. Chemo next Tuesday.    Review of Systems  Constitutional: Positive for fatigue. Negative for fever.  HENT: Negative for ear pain.   Eyes: Negative for pain.  Respiratory: Positive for cough and shortness of breath.   Cardiovascular: Negative for chest pain, palpitations and leg swelling.  Gastrointestinal: Negative for abdominal pain.       Objective:   Physical Exam  Constitutional: Vital signs are normal. She appears well-developed and well-nourished. She is cooperative.  Non-toxic appearance. She does not appear ill. No distress.  HENT:  Head: Normocephalic.  Right Ear: Hearing, tympanic membrane, external ear and ear canal normal. Tympanic membrane is not erythematous, not retracted and not bulging.  Left Ear: Hearing, tympanic membrane, external ear and ear canal normal. Tympanic membrane is not erythematous, not retracted and not bulging.  Nose: No mucosal edema or rhinorrhea. Right sinus exhibits no maxillary sinus tenderness and no frontal sinus tenderness. Left sinus exhibits no maxillary sinus tenderness and no frontal sinus tenderness.  Mouth/Throat: Uvula is midline, oropharynx is clear and moist and mucous membranes are normal.  Eyes: Conjunctivae, EOM and lids are normal. Pupils are equal, round, and reactive to light. Lids are everted and swept, no foreign bodies found.  Neck: Trachea normal and normal range of motion. Neck supple. Carotid bruit is not present. No thyroid mass and no  thyromegaly present.  Cardiovascular: Normal  rate, regular rhythm, S1 normal, S2 normal, normal heart sounds, intact distal pulses and normal pulses.  Exam reveals no gallop and no friction rub.   No murmur heard. Pulmonary/Chest: Effort normal. No tachypnea. No respiratory distress. She has decreased breath sounds. She has no wheezes. She has no rhonchi. She has no rales.  Unchanged from previous exam  Abdominal: Soft. Normal appearance and bowel sounds are normal. There is no tenderness.  Neurological: She is alert.  Skin: Skin is warm, dry and intact. No rash noted.  Psychiatric: Her speech is normal and behavior is normal. Judgment and thought content normal. Her mood appears not anxious. Cognition and memory are normal. She does not exhibit a depressed mood.          Assessment & Plan:

## 2014-04-26 NOTE — Telephone Encounter (Signed)
Thanks much, Stanton Kidney, for your help.  Rosalin Hawking, MD PhD Stroke Neurology 04/26/2014 4:53 PM

## 2014-04-26 NOTE — Assessment & Plan Note (Signed)
Unclear etiology. Has loop recorder in place. Work u[p negative so far. Has upcoming follow up with neuro.

## 2014-04-26 NOTE — Therapy (Signed)
[facility] [address] [place], [place], [address] Phone: [phone]   Fax:  [phone]  Physical Therapy Evaluation  Patient Details  Name: Danielle [name] MRN: [id number] Date of Birth: March 08, 1950 Referring Provider:  Rosalin Hawking, MD  Encounter Date: 04/26/2014      PT End of Session - 04/26/14 1729    Visit Number 1   Number of Visits 1   PT Start Time 0800   PT Stop Time 0845   PT Time Calculation (min) 45 min      Past Medical History  Diagnosis Date  . Hypertension   . Anemia   . H/O: pneumonia   . History of tobacco abuse quit 10/08    on nicotine patch  . Fibromyalgia   . Hypokalemia   . DJD (degenerative joint disease), cervical   . Thrush 12/11/2010  . 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 had chemo and radiation for lung ca    Past Surgical History  Procedure Laterality Date  . Cervical laminectomy  1995  . Other surgical history  2008    Gamma knife surgery to remove brain met   . 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 Knee surgery  1990    Left x 2  . Portacath placement  -[name]    TIP IN CAVOATRIAL JUNCTION  . Loop recorder implant N/A 04/19/2014    Procedure: LOOP RECORDER IMPLANT;  Surgeon: Thompson Grayer, MD;  Location: [facility] CATH LAB;  Service: Cardiovascular;  Laterality: N/A;  . Tee without cardioversion N/A 04/19/2014    Procedure: TRANSESOPHAGEAL ECHOCARDIOGRAM (TEE);  Surgeon: Larey Dresser, MD;  Location: [facility];  Service: Cardiovascular;  Laterality: N/A;    There were no vitals filed for this visit.  Visit Diagnosis:  Lack of coordination due to stroke - Plan: PT PLAN OF CARE CERT/RE-CERT      Subjective Assessment - 04/26/14 0811    Subjective Pt is a 64 year old female with history of lung cancer dx in 2007, possible mets to the brain, pt had 2 ministrokes on Saturday, April 9th. Pt was  hospitalized from April 9-12th. She is now having increase difficulty with her speech and memory including expressive aphasia.  Pt's daughter reports that she notices some weakness, requires hand held assist with ambulation in community.    Patient is accompained by: Family member   Pertinent History Upper lobe R lung cancer, currently undergoing chemotherapy for this. She has possible mets to the brain. Hx of fibromyalgia; tumor in upper thoracic spine and another in stomach   Patient Stated Goals "pt unable to state goals" Daughter and pt feel that she doesn't need physical therapy and that pt was minimally affected by the CVA from a physical standpoint.   Currently in Pain? No/denies            [facility] PT Assessment - 04/26/14 1715    Assessment   Medical Diagnosis CVA   Onset Date 04/16/14   Prior Therapy --  years ago for knee replacements   Precautions   Precautions Fall  has a heart monitor since last Tuesday   Balance Screen   Has the patient fallen in the past 6 months No   Has the patient had a decrease in activity level because of a fear of falling?  No   Is the patient reluctant to leave their  home because of a fear of falling?  No   Home Environment   Living Enviornment Private residence   Living Arrangements Spouse/significant other;Children   Available Help at Discharge Family   Type of [place] to enter  4   Entrance Stairs-Number of Steps 4   Entrance Stairs-Rails Can reach both   [place] One level   [place] - 2 wheels  hasn't needed RW for 7 years   Prior Function   Level of Independence Independent with gait;Independent with transfers  supervision with activity prior due to pain   Leisure watching son's baseball games, shopping   Cognition   Overall Cognitive Status Impaired/Different from baseline   Posture/Postural Control   Posture/Postural Control Postural limitations   Postural Limitations Forward  head;Posterior pelvic tilt;Left pelvic obliquity;Weight shift left   Transfers   Transfers --  independent   Ambulation/Gait   Ambulation/Gait Yes   Ambulation/Gait Assistance 7: Independent   Ambulation Distance (Feet) 200 Feet   Assistive device None   Gait Pattern Within Functional Limits   Ambulation Surface Level;Indoor   Gait velocity 3.20 ft/sec   Stairs Yes   Stairs Assistance 6: Modified independent (Device/Increase time)   Stair Management Technique One rail Right;Alternating pattern   Balance   Balance Assessed Yes   Standardized Balance Assessment   Standardized Balance Assessment Berg Balance Test;Dynamic Gait Index   Berg Balance Test   Sit to Stand Able to stand without using hands and stabilize independently   Standing Unsupported Able to stand safely 2 minutes   Sitting with Back Unsupported but Feet Supported on Floor or Stool Able to sit safely and securely 2 minutes   Stand to Sit Sits safely with minimal use of hands   Transfers Able to transfer safely, minor use of hands   Standing Unsupported with Eyes Closed Able to stand 10 seconds safely   Standing Ubsupported with Feet Together Able to place feet together independently and stand 1 minute safely   From Standing, Reach Forward with Outstretched Arm Can reach forward >12 cm safely (5")   From Standing Position, Pick up Object from Floor Able to pick up shoe safely and easily   From Standing Position, Turn to Look Behind Over each Shoulder Looks behind from both sides and weight shifts well   Turn 360 Degrees Able to turn 360 degrees safely in 4 seconds or less   Standing Unsupported, Alternately Place Feet on Step/Stool Able to complete >2 steps/needs minimal assist   Standing Unsupported, One Foot in Front Able to plae foot ahead of the other independently and hold 30 seconds   Standing on One Leg Tries to lift leg/unable to hold 3 seconds but remains standing independently   Total Score 48   Dynamic Gait  Index   Level Surface Normal   Change in Gait Speed Mild Impairment   Gait with Horizontal Head Turns Normal   Gait with Vertical Head Turns Normal   Gait and Pivot Turn Normal   Step Over Obstacle Mild Impairment   Step Around Obstacles Normal   Steps Mild Impairment   Total Score 21        Neuro re-ed: HEP taught and provided to address mild balance and hip flexor strength impairment. See pt instructions                               Plan -  May 03, 2014 1730    Clinical Impression Statement Pt had two small CVAs on 04/16/14. She presents with very mild impairment in balance; however pt and daughter report that she was minimally affected by the CVA. Pt's gait pattern is normal. Pt is independent with ambulation on indoor surfaces. Pt was provided with a balance HEP to address the mild balance impairments. No PT necessary at this time. Recommend speech therapy as pt demonstrated aphasia, difficutly following directions, difficulty attending to task with dual tasking.     Recommended Other Services Speech Therapy   Consulted and Agree with Plan of Care Patient;Family member/caregiver   Family Member Consulted daughter          G-Codes - May 03, 2014 1739    Functional Assessment Tool Used DGI 21/24   Functional Limitation Mobility: Walking and moving around   Mobility: Walking and Moving Around Current Status [phone]) At least 1 percent but less than 20 percent impaired, limited or restricted   Mobility: Walking and Moving Around Goal Status [phone]) At least 1 percent but less than 20 percent impaired, limited or restricted   Mobility: Walking and Moving Around Discharge Status [phone]) At least 1 percent but less than 20 percent impaired, limited or restricted       Problem List Patient Active Problem List   Diagnosis Date Noted  . Brain lesion   . Expressive aphasia   . Cerebral infarction due to embolism of cerebral artery   . HLD (hyperlipidemia)   .  Slurred speech 04/17/2014  . Acute encephalopathy 04/17/2014  . Acute CVA (cerebrovascular accident) 04/17/2014  . Stroke 04/17/2014  . AKI (acute kidney injury) 04/16/2014  . Cancer-related pain 12/28/2013  . Atherosclerosis of aorta 08/13/2013  . Cancer of upper lobe of right lung 11/19/2010  . BENIGN POSITIONAL VERTIGO 06/13/2009  . CONSTIPATION 04/07/2009  . Diabetes mellitus 09/29/2008  . HYPERCHOLESTEROLEMIA 09/29/2008  . INSOMNIA 02/22/2008  . ALLERGIC CONJUNCTIVITIS 01/20/2008  . PULMONARY NOCARDIOSIS 01/07/2008  . Essential hypertension 01/07/2008  . ANEMIA-NOS 03/31/2006  . LUNG CANCER, HX OF 03/31/2006  . PNEUMONIA, HX OF 03/31/2006  . LAMINECTOMY, HX OF 03/31/2006    Delrae Sawyers, PT,DPT,NCS 05-03-2014 5:42 PM Phone [phone] FAX [phone]         [facility] [address] [place], [place], [address] Phone: [phone]   Fax:  [phone]

## 2014-04-27 ENCOUNTER — Encounter: Payer: Self-pay | Admitting: *Deleted

## 2014-04-28 ENCOUNTER — Ambulatory Visit (INDEPENDENT_AMBULATORY_CARE_PROVIDER_SITE_OTHER): Payer: Medicare Other | Admitting: *Deleted

## 2014-04-28 DIAGNOSIS — I639 Cerebral infarction, unspecified: Secondary | ICD-10-CM

## 2014-04-28 LAB — MDC_IDC_ENUM_SESS_TYPE_INCLINIC
MDC IDC SET ZONE DETECTION INTERVAL: 2000 ms
Zone Setting Detection Interval: 3000 ms
Zone Setting Detection Interval: 330 ms

## 2014-04-28 NOTE — Progress Notes (Signed)
LINQ wound check in clinic. Battery status good. Normal device function. Rwaves 0.46 to 0.62m. Time correct in programmer. Carelink sending daily transmissions. Site well healed with no swelling or redness. Monthly summary reports and PRN with JA.

## 2014-04-29 ENCOUNTER — Ambulatory Visit (HOSPITAL_COMMUNITY)
Admission: RE | Admit: 2014-04-29 | Discharge: 2014-04-29 | Disposition: A | Discharge status: Home / Self Care | Payer: Medicare Other | Source: Ambulatory Visit | Attending: Physician Assistant | Admitting: Physician Assistant

## 2014-04-29 ENCOUNTER — Encounter (HOSPITAL_COMMUNITY): Payer: Self-pay

## 2014-04-29 DIAGNOSIS — R918 Other nonspecific abnormal finding of lung field: Secondary | ICD-10-CM | POA: Diagnosis not present

## 2014-04-29 DIAGNOSIS — Z87891 Personal history of nicotine dependence: Secondary | ICD-10-CM | POA: Insufficient documentation

## 2014-04-29 DIAGNOSIS — Z85118 Personal history of other malignant neoplasm of bronchus and lung: Secondary | ICD-10-CM | POA: Diagnosis not present

## 2014-04-29 DIAGNOSIS — J432 Centrilobular emphysema: Secondary | ICD-10-CM | POA: Diagnosis not present

## 2014-04-29 DIAGNOSIS — C3411 Malignant neoplasm of upper lobe, right bronchus or lung: Secondary | ICD-10-CM | POA: Insufficient documentation

## 2014-04-29 DIAGNOSIS — J9811 Atelectasis: Secondary | ICD-10-CM | POA: Diagnosis not present

## 2014-04-29 DIAGNOSIS — C3490 Malignant neoplasm of unspecified part of unspecified bronchus or lung: Secondary | ICD-10-CM | POA: Diagnosis not present

## 2014-04-29 HISTORY — DX: Cerebral infarction, unspecified: I63.9

## 2014-04-29 MED ORDER — IOHEXOL 300 MG/ML  SOLN
100.0000 mL | Freq: Once | INTRAMUSCULAR | Status: AC | PRN
  Administered 2014-04-29: 100 mL via INTRAVENOUS

## 2014-05-01 ENCOUNTER — Other Ambulatory Visit: Payer: Self-pay | Admitting: Family Medicine

## 2014-05-03 ENCOUNTER — Telehealth: Payer: Self-pay | Admitting: Physician Assistant

## 2014-05-03 ENCOUNTER — Encounter: Payer: Self-pay | Admitting: Physician Assistant

## 2014-05-03 ENCOUNTER — Ambulatory Visit (HOSPITAL_BASED_OUTPATIENT_CLINIC_OR_DEPARTMENT_OTHER): Payer: Medicare Other

## 2014-05-03 ENCOUNTER — Other Ambulatory Visit (HOSPITAL_BASED_OUTPATIENT_CLINIC_OR_DEPARTMENT_OTHER): Payer: Medicare Other

## 2014-05-03 ENCOUNTER — Encounter: Payer: Self-pay | Admitting: Internal Medicine

## 2014-05-03 ENCOUNTER — Ambulatory Visit (HOSPITAL_BASED_OUTPATIENT_CLINIC_OR_DEPARTMENT_OTHER): Payer: Medicare Other | Admitting: Physician Assistant

## 2014-05-03 VITALS — BP 136/74 | HR 109 | Temp 98.0°F | Resp 20 | Ht 63.0 in | Wt 153.4 lb

## 2014-05-03 DIAGNOSIS — Z006 Encounter for examination for normal comparison and control in clinical research program: Secondary | ICD-10-CM | POA: Diagnosis not present

## 2014-05-03 DIAGNOSIS — Z5111 Encounter for antineoplastic chemotherapy: Secondary | ICD-10-CM | POA: Diagnosis not present

## 2014-05-03 DIAGNOSIS — I6340 Cerebral infarction due to embolism of unspecified cerebral artery: Secondary | ICD-10-CM

## 2014-05-03 DIAGNOSIS — G939 Disorder of brain, unspecified: Secondary | ICD-10-CM | POA: Diagnosis not present

## 2014-05-03 DIAGNOSIS — C3411 Malignant neoplasm of upper lobe, right bronchus or lung: Secondary | ICD-10-CM

## 2014-05-03 DIAGNOSIS — C7931 Secondary malignant neoplasm of brain: Secondary | ICD-10-CM | POA: Diagnosis not present

## 2014-05-03 LAB — CBC WITH DIFFERENTIAL/PLATELET
BASO%: 0.1 % (ref 0.0–2.0)
BASOS ABS: 0 10*3/uL (ref 0.0–0.1)
EOS%: 0.2 % (ref 0.0–7.0)
Eosinophils Absolute: 0 10*3/uL (ref 0.0–0.5)
HEMATOCRIT: 36.3 % (ref 34.8–46.6)
HGB: 11.8 g/dL (ref 11.6–15.9)
LYMPH%: 18.6 % (ref 14.0–49.7)
MCH: 29.4 pg (ref 25.1–34.0)
MCHC: 32.5 g/dL (ref 31.5–36.0)
MCV: 90.3 fL (ref 79.5–101.0)
MONO#: 1.3 10*3/uL — ABNORMAL HIGH (ref 0.1–0.9)
MONO%: 10.4 % (ref 0.0–14.0)
NEUT#: 9 10*3/uL — ABNORMAL HIGH (ref 1.5–6.5)
NEUT%: 70.7 % (ref 38.4–76.8)
PLATELETS: 445 10*3/uL — AB (ref 145–400)
RBC: 4.02 10*6/uL (ref 3.70–5.45)
RDW: 15.4 % — ABNORMAL HIGH (ref 11.2–14.5)
WBC: 12.7 10*3/uL — ABNORMAL HIGH (ref 3.9–10.3)
lymph#: 2.4 10*3/uL (ref 0.9–3.3)

## 2014-05-03 LAB — COMPREHENSIVE METABOLIC PANEL (CC13)
ALT: 11 U/L (ref 0–55)
AST: 15 U/L (ref 5–34)
Albumin: 3.3 g/dL — ABNORMAL LOW (ref 3.5–5.0)
Alkaline Phosphatase: 77 U/L (ref 40–150)
Anion Gap: 13 mEq/L — ABNORMAL HIGH (ref 3–11)
BUN: 14.9 mg/dL (ref 7.0–26.0)
CO2: 22 mEq/L (ref 22–29)
Calcium: 9.7 mg/dL (ref 8.4–10.4)
Chloride: 103 mEq/L (ref 98–109)
Creatinine: 0.8 mg/dL (ref 0.6–1.1)
EGFR: 83 mL/min/{1.73_m2} — AB (ref >90)
GLUCOSE: 140 mg/dL (ref 70–140)
Potassium: 3.9 mEq/L (ref 3.5–5.1)
Sodium: 139 mEq/L (ref 136–145)
TOTAL PROTEIN: 6.9 g/dL (ref 6.4–8.3)
Total Bilirubin: 0.28 mg/dL (ref 0.20–1.20)

## 2014-05-03 LAB — RESEARCH LABS

## 2014-05-03 MED ORDER — SODIUM CHLORIDE 0.9 % IJ SOLN
10.0000 mL | INTRAMUSCULAR | Status: DC | PRN
  Administered 2014-05-03: 10 mL
  Filled 2014-05-03: qty 10

## 2014-05-03 MED ORDER — HEPARIN SOD (PORK) LOCK FLUSH 100 UNIT/ML IV SOLN
500.0000 [IU] | Freq: Once | INTRAVENOUS | Status: AC | PRN
  Administered 2014-05-03: 500 [IU]
  Filled 2014-05-03: qty 5

## 2014-05-03 MED ORDER — SODIUM CHLORIDE 0.9 % IV SOLN
500.0000 mg/m2 | Freq: Once | INTRAVENOUS | Status: AC
  Administered 2014-05-03: 900 mg via INTRAVENOUS
  Filled 2014-05-03: qty 36

## 2014-05-03 MED ORDER — SODIUM CHLORIDE 0.9 % IV SOLN
Freq: Once | INTRAVENOUS | Status: AC
  Administered 2014-05-03: 11:00:00 via INTRAVENOUS

## 2014-05-03 MED ORDER — DEXAMETHASONE SODIUM PHOSPHATE 100 MG/10ML IJ SOLN
Freq: Once | INTRAMUSCULAR | Status: AC
  Administered 2014-05-03: 11:00:00 via INTRAVENOUS
  Filled 2014-05-03: qty 4

## 2014-05-03 NOTE — Progress Notes (Signed)
BMS [id number] - questionnaires (PROs) - patient into the cancer center for routine visit. Patient was given PROs upon arrival to the cancer center. The patient completed her PROs (EQ-5D-3L first and then the LCSS booklet) before any study procedures were performed. I checked the PROs for completeness.  Research blood was drawn.  The patient was thanked for her continued support of this clinical trial. Barb Tobie Hellen 05/03/2014.09:08AM

## 2014-05-03 NOTE — Patient Instructions (Signed)
[facility] Discharge Instructions for Patients Receiving Chemotherapy  Today you received the following chemotherapy agents [place]  To help prevent nausea and vomiting after your treatment, we encourage you to take your nausea medication as directed/prescribed   If you develop nausea and vomiting that is not controlled by your nausea medication, call the clinic.   BELOW ARE SYMPTOMS THAT SHOULD BE REPORTED IMMEDIATELY:  *FEVER GREATER THAN 100.5 F  *CHILLS WITH OR WITHOUT FEVER  NAUSEA AND VOMITING THAT IS NOT CONTROLLED WITH YOUR NAUSEA MEDICATION  *UNUSUAL SHORTNESS OF BREATH  *UNUSUAL BRUISING OR BLEEDING  TENDERNESS IN MOUTH AND THROAT WITH OR WITHOUT PRESENCE OF ULCERS  *URINARY PROBLEMS  *BOWEL PROBLEMS  UNUSUAL RASH Items with * indicate a potential emergency and should be followed up as soon as possible.  Feel free to call the clinic you have any questions or concerns. The clinic phone number is [phone].  Please show the CHEMO ALERT CARD at check-in to the Emergency Department and triage nurse.   

## 2014-05-03 NOTE — Progress Notes (Addendum)
[facility] [phone]   [phone]  OFFICE PROGRESS NOTE  [name], MD [place] [address]  Diagnosis:  Metastatic non-small cell lung cancer initially diagnosed with locally advanced stage IIIB with right Pancoast tumor involving the vertebral body as well as the foraminal canal invasion with spinal cord compression in October of 2007.   Prior Therapy:  1. Status post concurrent chemoradiation with weekly carboplatin and paclitaxel, last dose was given November 18, 2005. 2. 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 3. Status post gamma knife radiotherapy to a solitary brain lesion located in the superior frontal area of the brain at [facility] in April of 2008. 4. Status post palliative radiotherapy to the lateral abdominal wall metastatic lesion under the care of Dr. Lisbeth Renshaw, completed March of 2009. 5. Status post 6 cycles of systemic chemotherapy with carboplatin and Alimta. Last dose was given July 26, 2007 with disease stabilization. 6. Gamma knife stereotactic radiotherapy to 2 brain lesions one involving the right frontal dural based as well as right parietal lesion performed on 05/07/2012 under the care of Dr. Vallarie Mare at [facility].  Current Therapy:  Maintenance chemotherapy with Alimta 500 mg per meter squared given every 3 weeks. The patient is status post 115 cycles.   CHEMOTHERAPY INTENT: Palliative/maintenance  CURRENT # OF CHEMOTHERAPY CYCLES: 116 CURRENT ANTIEMETICS: Zofran, dexamethasone and Compazine  CURRENT SMOKING STATUS: Quit smoking 10/07/2005  ORAL CHEMOTHERAPY AND CONSENT: None  CURRENT BISPHOSPHONATES USE: None  PAIN MANAGEMENT: 0/10 currently on morphine  NARCOTICS INDUCED CONSTIPATION: No constipation  LIVING WILL AND CODE STATUS: No CODE BLUE   INTERVAL HISTORY: Danielle [name] 65 y.o. female  returns to the clinic today for followup visit accompanied by her sister. She was recently hospitalized for aphasia and confusion. MRI of the brain confirmed an acute CVA in the left frontal lobe and an acute CVA in the right parietal lobe. She was discharged on Vimpat and continues on this medication. She reports some short term memory loss and her sister notes she still occasionally has some mixed up words or sentences The patient is feeling fine today with no specific complaints. No significant fever or chills. She is tolerating her maintenance treatment with single agent Alimta fairly well with no significant adverse effects except for few episodes of nausea resolved with her antiemetics. The patient denied having any chest pain, shortness of breath, cough or hemoptysis. No nausea or vomiting. The patient denied having any weight loss or night sweats. She is here today to start cycle #116 her chemotherapy. She also recently had a restaging CT scan of her chest, abdomen and pelvis to re-evaluate her disease and she presents to discuss the results.  MEDICAL HISTORY: Past Medical History  Diagnosis Date  . Hypertension   . Anemia   . H/O: pneumonia   . History of tobacco abuse quit 10/08    on nicotine patch  . Fibromyalgia   . Hypokalemia   . DJD (degenerative joint disease), cervical   . Thrush 12/11/2010  . 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 had chemo and radiation for lung ca  . CVA (cerebral vascular accident) 04/2014    pt states had 2 cva's within 2 wks    ALLERGIES:  is allergic to contrast media; iohexol; sulfa drugs cross reactors; and co-trimoxazole injection.  MEDICATIONS:  Current Outpatient Prescriptions  Medication Sig Dispense Refill  . ALPRAZolam (XANAX) 0.25 MG tablet Take 1 tablet (0.25 mg total) by mouth at bedtime as needed for sleep. (Patient taking differently: Take 0.25 mg by mouth at bedtime. ) 30 tablet 0  . amLODipine (NORVASC) 10  MG tablet TAKE 1 TABLET (10 MG TOTAL) BY MOUTH DAILY. 90 tablet 1  . aspirin EC 325 MG EC tablet Take 1 tablet (325 mg total) by mouth daily. 30 tablet 0  . atorvastatin (LIPITOR) 40 MG tablet Take 1 tablet (40 mg total) by mouth daily at 6 PM. 90 tablet 3  . fluticasone (FLONASE) 50 MCG/ACT nasal spray USE 1-2 SPRAYS IN EACH NOSTRIL DAILY (Patient taking differently: USE 1-2 SPRAYS IN EACH NOSTRIL DAILY PRN CONGESTION) 16 g 11  . folic acid (FOLVITE) 1 MG tablet TAKE 1 TABLET BY MOUTH DAILY. 90 tablet 1  . lacosamide (VIMPAT) 50 MG TABS tablet Take 1 tablet (50 mg total) by mouth 2 (two) times daily. 60 tablet 0  . lidocaine-prilocaine (EMLA) cream Apply topically as needed. (Patient taking differently: Apply 1 application topically See admin instructions. Apply 1 hour before chemo treatments (every 3 weeks)) 30 g 1  . lisinopril (PRINIVIL,ZESTRIL) 20 MG tablet TAKE 2 TABLETS (40 MG TOTAL) BY MOUTH DAILY. (Patient taking differently: TAKE 1 TABLET (20MG TOTAL) BY MOUTH DAILY AT BEDTIME) 60 tablet 5  . morphine (MS CONTIN) 60 MG 12 hr tablet Take 60 mg by mouth 2 (two) times daily.  0  . morphine (MSIR) 30 MG tablet Take 1 tablet (30 mg total) by mouth every 4 (four) hours as needed. (Patient taking differently: Take 30 mg by mouth every 4 (four) hours as needed (breakthrough pain). ) 60 tablet 0  . olopatadine (PATANOL) 0.1 % ophthalmic solution Place 1 drop into both eyes 2 (two) times daily. 5 mL 11  . ondansetron (ZOFRAN) 8 MG tablet TAKE 1 TABLET BY MOUTH EVERY 8 HOURS AS NEEDED FOR NAUSEA. (Patient taking differently: Take 8 mg by mouth 2 (two) times daily as needed for nausea or vomiting. ) 30 tablet 2  . predniSONE (DELTASONE) 50 MG tablet Take 50 mg by mouth See admin instructions. Take 1 tablet (50 mg) 13 hours before CT scan, 7 hours before CT scan and 1 hour before CT scan (every 9 weeks)     No current facility-administered medications for this visit.    SURGICAL HISTORY:  Past  Surgical History  Procedure Laterality Date  . Cervical laminectomy  1995  . Other surgical history  2008    Gamma knife surgery to remove brain met   . 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 Knee surgery  1990    Left x 2  . Portacath placement  -[name]    TIP IN CAVOATRIAL JUNCTION  . Loop recorder implant N/A 04/19/2014    Procedure: LOOP RECORDER IMPLANT;  Surgeon: Thompson Grayer, MD;  Location: [facility] CATH LAB;  Service: Cardiovascular;  Laterality: N/A;  . Tee without cardioversion N/A 04/19/2014    Procedure: TRANSESOPHAGEAL ECHOCARDIOGRAM (TEE);  Surgeon: Larey Dresser, MD;  Location: [facility];  Service: Cardiovascular;  Laterality: N/A;    REVIEW OF SYSTEMS:  Constitutional: positive for fatigue Eyes: negative Ears, nose, mouth, throat, and face: negative Respiratory: negative Cardiovascular: negative Gastrointestinal: negative Genitourinary:negative Integument/breast: negative Hematologic/lymphatic: negative Musculoskeletal:negative Neurological: positive for memory problems, speech problems and recent CVAs Behavioral/Psych: negative Endocrine: negative Allergic/Immunologic: negative  PHYSICAL EXAMINATION: General appearance: alert, cooperative and no distress Head: Normocephalic, without obvious abnormality, atraumatic Neck: no adenopathy, no JVD, supple, symmetrical, trachea midline and thyroid not enlarged, symmetric, no tenderness/mass/nodules Lymph nodes: Cervical, supraclavicular, and axillary nodes normal. Resp: clear to auscultation bilaterally Back: symmetric, no curvature. ROM normal. No CVA tenderness. Cardio: regular rate and rhythm, S1, S2 normal, no murmur, click, rub or gallop GI: soft, non-tender; bowel sounds normal; no masses,  no organomegaly Extremities: extremities normal, atraumatic, no cyanosis or edema Neurologic: Alert and oriented X 3, normal strength and tone. Normal symmetric reflexes. Normal coordination  and gait  ECOG PERFORMANCE STATUS: 0 - Asymptomatic  Blood pressure 136/74, pulse 109, temperature 98 F (36.7 C), temperature source Oral, resp. rate 20, height _0  (1.6 m), weight 153 lb 6.4 oz (69.582 kg), SpO2 99 %.  LABORATORY DATA: Lab Results  Component Value Date   WBC 12.7* 05/03/2014   HGB 11.8 05/03/2014   HCT 36.3 05/03/2014   MCV 90.3 05/03/2014   PLT 445* 05/03/2014      Chemistry      Component Value Date/Time   NA 139 05/03/2014 0855   NA 135 04/26/2014 1205   K 3.9 05/03/2014 0855   K 4.2 04/26/2014 1205   CL 103 04/26/2014 1205   CL 106 06/30/2012 1004   CO2 22 05/03/2014 0855   CO2 29 04/26/2014 1205   BUN 14.9 05/03/2014 0855   BUN 17 04/26/2014 1205   CREATININE 0.8 05/03/2014 0855   CREATININE 0.67 04/26/2014 1205      Component Value Date/Time   CALCIUM 9.7 05/03/2014 0855   CALCIUM 9.5 04/26/2014 1205   ALKPHOS 77 05/03/2014 0855   ALKPHOS 57 04/26/2014 1205   AST 15 05/03/2014 0855   AST 15 04/26/2014 1205   ALT 11 05/03/2014 0855   ALT 10 04/26/2014 1205   BILITOT 0.28 05/03/2014 0855   BILITOT 0.4 04/26/2014 1205       RADIOGRAPHIC STUDIES: Dg Chest 2 View  04/18/2014   CLINICAL DATA:  Stroke protocol  EXAM: CHEST  2 VIEW  COMPARISON:  Chest CT 12/24/2013  FINDINGS: Right IJ porta catheter is in good position, tip at the upper cavoatrial junction. Normal heart size and aortic contours.  There is chronic reticulation in the right upper lung. No edema, consolidation, effusion, or pneumothorax. No acute osseous findings. Patient status post 2 level thoracic vertebral body augmentation with cement.  IMPRESSION: No active cardiopulmonary disease.   Electronically Signed   By: Monte Fantasia M.D.   On: 04/18/2014 04:15   Ct Head (brain) Wo Contrast  04/16/2014   CLINICAL DATA:  Altered mental status and slurred speech. Last seen baseline at approximately noon today. Began noticing altered status at 15:00 today. Weakness, slurred speech,  unsteady on feet, confusion. History of lung cancer.  EXAM: CT HEAD WITHOUT CONTRAST  TECHNIQUE: Contiguous axial images were obtained from the base of the skull through the vertex without intravenous contrast.  COMPARISON:  MRI brain 10/09/2006  FINDINGS: There is subtle low-attenuation change with loss of gray-white matter distinction in the left frontal lobe. Area of involvement measures about 2.6 x 3.1 cm. Appearance may represent focal ischemia or metastasis. Suggest MRI for further evaluation. Ventricles and sulci are otherwise symmetrical. No ventricular dilatation. No abnormal extra-axial fluid collections. Basal cisterns are not effaced. No acute intracranial hemorrhage. Mucosal thickening throughout the paranasal sinuses with opacification in some of the ethmoid air cells. Mastoid air cells are not opacified. Calvarium  appears intact.  IMPRESSION: Subtle area of low attenuation in the left frontal lobe. This could represent focal ischemia or metastasis. Suggest MRI for further evaluation. No acute intracranial hemorrhage. No significant mass effect.   Electronically Signed   By: Lucienne Capers M.D.   On: 04/16/2014 22:09   Ct Chest W Contrast  04/29/2014   CLINICAL DATA:  Patient with history of non-small cell lung carcinoma. Restaging exam.  EXAM: CT CHEST, ABDOMEN, AND PELVIS WITH CONTRAST  TECHNIQUE: Multidetector CT imaging of the chest, abdomen and pelvis was performed following the standard protocol during bolus administration of intravenous contrast.  CONTRAST:  168m OMNIPAQUE IOHEXOL 300 MG/ML  SOLN  COMPARISON:  CT CP 12/24/2013  FINDINGS: CT CHEST FINDINGS  Mediastinum/Nodes: Right anterior chest wall Port-A-Cath is present with tip terminating in the right atrium. No enlarged axillary, mediastinal or hilar lymphadenopathy. The heart is normal in size. No pericardial effusion. Coronary arterial vascular calcifications. Aorta and main pulmonary artery are normal in caliber.  Lungs/Pleura:  The central airways are patent. Centrilobular emphysematous changes. Unchanged subpleural 9 mm opacity within the right upper lobe (image 16; series 5). No pleural effusion or pneumothorax. Minimal dependent atelectasis left lung base.  Musculoskeletal: No aggressive or acute appearing osseous lesions. Multiple thoracic vertebral bodies with kyphoplasty material.  CT ABDOMEN AND PELVIS FINDINGS  Hepatobiliary: Liver is normal in size and contour without focal hepatic lesion identified. No intrahepatic or extrahepatic biliary ductal dilatation. The gallbladder is unremarkable.  Pancreas: Unremarkable  Spleen: Unremarkable  Adrenals/Urinary Tract: Normal adrenal gland. Kidneys enhance symmetrically with contrast. No hydronephrosis. The urinary bladder is unremarkable.  Stomach/Bowel: Oral contrast material is present to the level of the rectum. The appendix is normal. No abnormal bowel wall thickening or evidence for bowel obstruction. No free fluid or free intraperitoneal air.  Vascular/Lymphatic: Normal caliber abdominal aorta. No retroperitoneal lymphadenopathy.  Other: The uterus and bilateral adnexal structures are unremarkable.  Musculoskeletal: Lower lumbar spine degenerative changes. No aggressive or acute appearing osseous lesions. Unchanged compression deformity of the T3 vertebral body.  IMPRESSION: Stable exam without evidence for locally recurrent or metastatic disease within the chest, abdomen or pelvis.   Electronically Signed   By: DLovey NewcomerM.D.   On: 04/29/2014 08:05   Mr BJeri CosWGYContrast  04/18/2014   ADDENDUM REPORT: 04/18/2014 19:07  ADDENDUM: Patient's prior exams have become available. No significant change from initial dictation.   Electronically Signed   By: SGenia DelM.D.   On: 04/18/2014 19:07   04/18/2014   CLINICAL DATA:  64year old hypertensive female with sudden onset of altered mental status, slurred speech and unsteady gait. Lung cancer with metastatic disease post gamma  knife surgery. Subsequent encounter.  EXAM: MRI HEAD WITHOUT AND WITH CONTRAST  TECHNIQUE: Multiplanar, multiecho pulse sequences of the brain and surrounding structures were obtained without and with intravenous contrast.  CONTRAST:  122mMULTIHANCE GADOBENATE DIMEGLUMINE 529 MG/ML IV SOLN  COMPARISON:  04/16/2014 head CT. Report from 10/09/2006 MR. Images not available currently.  FINDINGS: This exam was interpreted during a PACS downtime with limited availability of comparison cases. It has been flagged for review following the downtime. If clinically indicated after this review, an addendum will be issued providing details about comparison to prior imaging.  Moderate-size acute hemorrhagic anterior left frontal lobe infarct with extension to the anterior left peri operculum region and subinsular region. Mild local mass effect.  Small acute scattered right parietal lobe infarcts.  Remote small right lenticular  nucleus infarct.  No intracranial enhancing lesion or bony destructive lesion to suggest the presence of intracranial metastatic disease.  No hydrocephalus.  Vertebral arteries, basilar artery and internal carotid arteries as well as major dural sinuses are patent.  Slightly small pituitary gland. Pineal region and orbital structures unremarkable.  Paranasal sinus mucosal thickening/ partial opacification most notable maxillary sinuses, ethmoid sinus air cells and right sphenoid sinus.  IMPRESSION: This exam was interpreted during a PACS downtime with limited availability of comparison cases. It has been flagged for review following the downtime. If clinically indicated after this review, an addendum will be issued providing details about comparison to prior imaging.  Moderate-size acute hemorrhagic anterior left frontal lobe infarct with extension to the anterior left peri operculum region and subinsular region. Mild local mass effect.  Small acute scattered right parietal lobe infarcts.  Remote small  right lenticular nucleus infarct.  No intracranial enhancing lesion or bony destructive lesion to suggest the presence of intracranial metastatic disease.  Paranasal sinus mucosal thickening/ partial opacification most notable maxillary sinuses, ethmoid sinus air cells and right sphenoid sinus.  These results were called by telephone at the time of interpretation on 04/17/2014 at 11:08 am to [facility] patient's nurse, who verbally acknowledged these results. Request to call physician on call with results.  Electronically Signed: By: Genia Del M.D. On: 04/17/2014 11:19   Ct Abdomen Pelvis W Contrast  04/29/2014   CLINICAL DATA:  Patient with history of non-small cell lung carcinoma. Restaging exam.  EXAM: CT CHEST, ABDOMEN, AND PELVIS WITH CONTRAST  TECHNIQUE: Multidetector CT imaging of the chest, abdomen and pelvis was performed following the standard protocol during bolus administration of intravenous contrast.  CONTRAST:  113m OMNIPAQUE IOHEXOL 300 MG/ML  SOLN  COMPARISON:  CT CP 12/24/2013  FINDINGS: CT CHEST FINDINGS  Mediastinum/Nodes: Right anterior chest wall Port-A-Cath is present with tip terminating in the right atrium. No enlarged axillary, mediastinal or hilar lymphadenopathy. The heart is normal in size. No pericardial effusion. Coronary arterial vascular calcifications. Aorta and main pulmonary artery are normal in caliber.  Lungs/Pleura: The central airways are patent. Centrilobular emphysematous changes. Unchanged subpleural 9 mm opacity within the right upper lobe (image 16; series 5). No pleural effusion or pneumothorax. Minimal dependent atelectasis left lung base.  Musculoskeletal: No aggressive or acute appearing osseous lesions. Multiple thoracic vertebral bodies with kyphoplasty material.  CT ABDOMEN AND PELVIS FINDINGS  Hepatobiliary: Liver is normal in size and contour without focal hepatic lesion identified. No intrahepatic or extrahepatic biliary ductal dilatation. The gallbladder is  unremarkable.  Pancreas: Unremarkable  Spleen: Unremarkable  Adrenals/Urinary Tract: Normal adrenal gland. Kidneys enhance symmetrically with contrast. No hydronephrosis. The urinary bladder is unremarkable.  Stomach/Bowel: Oral contrast material is present to the level of the rectum. The appendix is normal. No abnormal bowel wall thickening or evidence for bowel obstruction. No free fluid or free intraperitoneal air.  Vascular/Lymphatic: Normal caliber abdominal aorta. No retroperitoneal lymphadenopathy.  Other: The uterus and bilateral adnexal structures are unremarkable.  Musculoskeletal: Lower lumbar spine degenerative changes. No aggressive or acute appearing osseous lesions. Unchanged compression deformity of the T3 vertebral body.  IMPRESSION: Stable exam without evidence for locally recurrent or metastatic disease within the chest, abdomen or pelvis.   Electronically Signed   By: DLovey NewcomerM.D.   On: 04/29/2014 08:05   UKoreaRenal  04/17/2014   CLINICAL DATA:  Acute kidney injury. Elevated serum creatinine level. History of hypertension and lung cancer. Initial encounter.  EXAM:  RENAL/URINARY TRACT ULTRASOUND COMPLETE  COMPARISON:  None.  FINDINGS: Right Kidney:  Length: 10.5 cm. Echogenicity within normal limits. No mass or hydronephrosis visualized.  Left Kidney:  Length: 11.2 cm. Echogenicity within normal limits. No mass or hydronephrosis visualized.  Bladder:  Decompressed and suboptimally evaluated.  IMPRESSION: Normal renal ultrasound. No evidence of hydronephrosis. Decompressed bladder.   Electronically Signed   By: Richardean Sale M.D.   On: 04/17/2014 19:05   Mr Jodene Nam Head/brain Wo Cm  04/18/2014   CLINICAL DATA:  Stroke, history of metastatic lung cancer with brain metastasis, status post gamma knife surgery.  EXAM: MRA HEAD WITHOUT CONTRAST  TECHNIQUE: Angiographic images of the Circle of Willis were obtained using MRA technique without intravenous contrast.  COMPARISON:  MRI of the head with  contrast April 10th 2016 and CT of the head April 16, 2014  FINDINGS: Anterior circulation: Normal flow related enhancement of the included cervical, petrous, cavernous and supra clinoid internal carotid arteries. Patent anterior communicating artery. Normal flow related enhancement of the anterior and middle cerebral arteries,, however there is slightly decreased number of distal LEFT middle cerebral artery branches.  No large vessel occlusion, high-grade stenosis, abnormal luminal irregularity, aneurysm.  Posterior circulation: RIGHT vertebral artery is dominant. Basilar artery is patent, with normal flow related enhancement of the main branch vessels. Normal flow related enhancement of the posterior cerebral arteries. Tiny LEFT posterior communicating artery is present.  No large vessel occlusion, high-grade stenosis, abnormal luminal irregularity, aneurysm.  LEFT frontal lobe T1 shortening partially imaged.  IMPRESSION: No large vessel occlusion. Decreased number of distal LEFT middle cerebral artery branches.  LEFT frontal lobe hemorrhage versus enhancement partially imaged.   Electronically Signed   By: Elon Alas   On: 04/18/2014 02:59     ASSESSMENT AND PLAN: This is a very pleasant 64 years old white female with metastatic non-small cell lung cancer currently undergoing maintenance chemotherapy with single agent Alimta status post 115 cycles. The patient is tolerating her treatment fairly well with no significant adverse effects.  The patient was discussed with and also seen by Dr. Julien Nordmann. She will receive cycle #116 today. Her recent restaging CT scan revealed stable disease without evidence for locally recurrent or metastatic disease. She is advised to continue on Vimpat and follow up with Neurology as scheduled. She is also to follow up with cardiology regarding her heart rate monitor.She will follow in 3 weeks prior to cycle #117. For pain management, she will continue on her current pain  medication with MS Contin and MS IR.  She was advised to call immediately if she has any concerning symptoms in the interval. The patient voices understanding of current disease status and treatment options and is in agreement with the current care plan.  All questions were answered. The patient knows to call the clinic with any problems, questions or concerns. We can certainly see the patient much sooner if necessary.  Carlton Adam, PA-C 05/03/2014  ADDENDUM:  Hematology/Oncology Attending:  I had a face to face encounter with the patient. I recommended his care plan. This is a very pleasant 64 years old white female with metastatic non-small cell lung cancer, status post systemic chemotherapy with carboplatin and Alimta and she is currently on maintenance Alimta status post 115 cycles. She is tolerating her maintenance chemotherapy fairly well was no significant adverse effects. The patient was recently diagnosed with stroke when she presented with confusion and inability to find words. She was evaluated by neurology and  started on Vimpat and tolerating it well.  The recent CT scan of the chest, abdomen and pelvis showed no evidence for disease progression. I discussed the scan results with the patient and her sister. The patient is feeling much better today. We'll proceed with cycle #116 today as scheduled. For pain management, the patient will continue on her current treatment with MS Contin and MSIR. The patient would come back for follow-up visit in 3 weeks with the next cycle of her treatment. She was advised to call immediately if she has any concerning symptoms in the interval.  Disclaimer: This note was dictated with voice recognition software. Similar sounding words can inadvertently be transcribed and may be missed upon review. Eilleen Kempf., MD 05/07/2014

## 2014-05-03 NOTE — Telephone Encounter (Signed)
Gave avs & calendar for May also May thru July for all other appointments. Sent message to adjust treatment.

## 2014-05-03 NOTE — Telephone Encounter (Signed)
Chemo appointments adjusted per staff message   anne

## 2014-05-05 ENCOUNTER — Ambulatory Visit: Payer: Medicare Other

## 2014-05-05 DIAGNOSIS — I6980 Unspecified sequelae of other cerebrovascular disease: Secondary | ICD-10-CM | POA: Diagnosis not present

## 2014-05-05 DIAGNOSIS — R279 Unspecified lack of coordination: Secondary | ICD-10-CM | POA: Diagnosis not present

## 2014-05-05 DIAGNOSIS — R4701 Aphasia: Secondary | ICD-10-CM | POA: Diagnosis not present

## 2014-05-05 DIAGNOSIS — R29898 Other symptoms and signs involving the musculoskeletal system: Secondary | ICD-10-CM | POA: Diagnosis not present

## 2014-05-05 NOTE — Patient Instructions (Signed)
Continue on Vimpat as prescribed and follow up with Neurology and cardiology as scheduled Follow up in 3 weeks, prior to your next scheduled cycle of maintenance chemotherapy

## 2014-05-05 NOTE — Therapy (Signed)
[facility] [address] [place], [place], [address] Phone: [phone]   Fax:  [phone]  Speech Language Pathology Evaluation  Patient Details  Name: NAELANI LAFRANCE MRN: [id number] Date of Birth: Mar 09, 1950 Referring Provider:  [name], MD  Encounter Date: 05/05/2014      End of Session - 05/05/14 1057    Visit Number 1   Number of Visits 16   Date for SLP Re-Evaluation 07/04/14   SLP Start Time 0850   SLP Stop Time  0931   SLP Time Calculation (min) 41 min   Activity Tolerance Patient tolerated treatment well      Past Medical History  Diagnosis Date  . Hypertension   . Anemia   . H/O: pneumonia   . History of tobacco abuse quit 10/08    on nicotine patch  . Fibromyalgia   . Hypokalemia   . DJD (degenerative joint disease), cervical   . Thrush 12/11/2010  . 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 had chemo and radiation for lung ca  . CVA (cerebral vascular accident) 04/2014    pt states had 2 cva's within 2 wks    Past Surgical History  Procedure Laterality Date  . Cervical laminectomy  1995  . Other surgical history  2008    Gamma knife surgery to remove brain met   . 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 Knee surgery  1990    Left x 2  . Portacath placement  -[name]    TIP IN CAVOATRIAL JUNCTION  . Loop recorder implant N/A 04/19/2014    Procedure: LOOP RECORDER IMPLANT;  Surgeon: Thompson Grayer, MD;  Location: [facility] CATH LAB;  Service: Cardiovascular;  Laterality: N/A;  . Tee without cardioversion N/A 04/19/2014    Procedure: TRANSESOPHAGEAL ECHOCARDIOGRAM (TEE);  Surgeon: Larey Dresser, MD;  Location: [facility];  Service: Cardiovascular;  Laterality: N/A;    There were no vitals filed for this visit.  Visit Diagnosis: Aphasia - Plan: SLP plan of care cert/re-cert      Subjective Assessment - 05/05/14 0934     Patient is accompained by: Family member  Leanna - dtr in law            SLP Evaluation [facility] - 05/05/14 0931    SLP Visit Information   SLP Received On 05/05/14   Onset Date 04-16-14   Medical Diagnosis CVA   Subjective   Subjective Pt reports having difficulty with expressive communication   Pain Assessment   Currently in Pain? No/denies   General Information   HPI Pt has history of lung cancer with brain metastasis status post gamma knife procedures, hypertension, previous tobacco history, and fibromyalgia who was visiting her daughter when it was noted that the patient's speech was becoming slurred and she seemed to be using incorrect words while trying to communicate.  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 She presents today due to anomia/dysnomia as her and family's (daughter in [place]) primary complaint.   Prior Functional Status   Cognitive/Linguistic Baseline Within functional limits   Vocation Part time employment  works at [employer]   Cognition   Overall Cognitive Status Impaired/Different from baseline  no formal  testing today to confirm - from PT, OT evals.    Auditory Comprehension   Overall Auditory Comprehension Appears within functional limits for tasks assessed   Expression   Primary Mode of Expression Verbal   Verbal Expression   Overall Verbal Expression Impaired   Initiation No impairment   Repetition No impairment   Naming Impairment   Confrontation 75-100% accurate   Verbal Errors Semantic paraphasias;Perseveration;Aware of errors;Inconsistent   Effective Techniques Phonemic cues;Open ended questions;Semantic cues  Pt using description strategy with spontaneous speech   Oral Motor/Sensory Function   Overall Oral Motor/Sensory Function  Appears within functional limits for tasks assessed   Motor Speech   Overall Motor Speech Appears within functional limits for tasks assessed   Standardized Assessments   Standardized Assessments  [place] Naming Test-2nd edition   [place] Naming Test-2nd edition  24/60   Assessment   Therapy Diagnosis Aphasia              SLP Education - Jun 04, 2014 1056    Education provided Yes   Education Details course of treatment, strategies to try at home to A pt   Person(s) Educated Patient;Caregiver(s)   Methods Explanation;Demonstration   Comprehension Verbalized understanding          SLP Short Term Goals - 06/04/14 1059    SLP SHORT TERM GOAL #1   Title pt engage in confrontation naming (non-common items) 85% success   Time 4   Period Weeks   Status New   SLP SHORT TERM GOAL #2   Title pt generate simple sentence responses with 80% success and rare min A   Time 4   Period Weeks   Status New   SLP SHORT TERM GOAL #3   Title pt participate in functional simple conversation (using verbal expression strategeies) for 8 minutes with rare min A    Time 4   Period Weeks   Status New   SLP SHORT TERM GOAL #4   Title pt will demo error awareness in conversation 90% of the time   Time 4   Period Weeks   Status New          SLP Long Term Goals - 06-04-2014 1306    SLP LONG TERM GOAL #1   Title pt will participate in 8 minutes functional mod complex conversation with modified independence (compensations)   Time 8   Period Weeks   Status New   SLP LONG TERM GOAL #2   Title pt will use compensations for expressive aphasia in mod complex/complex conversation with rare min a to do so   SLP LONG TERM GOAL #3   Title pt will demo understanding of mod complex conversation with rare repeats necessary          Plan - 06/04/14 1057    Clinical Impression Statement Pt presents today with mild-mod expressive aphasia, cannot completely rule out receptive aphasia or  cognitive-linguistic defictis at this time. Pt would benefit from skilled ST addressing pt's correct use of expressive language and to maximize these skills to encourage/regain independence.   Speech Therapy Frequency 2x / week   Duration --  8 weeks   Treatment/Interventions Language facilitation;Cueing hierarchy;Functional tasks;Patient/family education;Compensatory strategies;SLP instruction and feedback   Potential to Achieve Goals Good          G-Codes - 06-04-2014 1103    Functional Limitations Spoken language expressive   Spoken Language Expression Current Status [phone]) At least 20 percent but less than 40 percent impaired, limited or  restricted   Spoken Language Expression Goal Status [phone]) At least 1 percent but less than 20 percent impaired, limited or restricted      Problem List Patient Active Problem List   Diagnosis Date Noted  . UTI (urinary tract infection) 04/26/2014  . Brain lesion   . Expressive aphasia   . Cerebral infarction due to embolism of cerebral artery   . HLD (hyperlipidemia)   . Acute encephalopathy 04/17/2014  . Acute CVA (cerebrovascular accident) 04/17/2014  . AKI (acute kidney injury) 04/16/2014  . Cancer-related pain 12/28/2013  . Atherosclerosis of aorta 08/13/2013  . Cancer of upper lobe of right lung 11/19/2010  . BENIGN POSITIONAL VERTIGO 06/13/2009  . CONSTIPATION 04/07/2009  . Diabetes mellitus 09/29/2008  . HYPERCHOLESTEROLEMIA 09/29/2008  . INSOMNIA 02/22/2008  . ALLERGIC CONJUNCTIVITIS 01/20/2008  . PULMONARY NOCARDIOSIS 01/07/2008  . Essential hypertension 01/07/2008  . ANEMIA-NOS 03/31/2006  . LUNG CANCER, HX OF 03/31/2006  . PNEUMONIA, HX OF 03/31/2006  . Lenn Cal OF 03/31/2006    Garald Balding, SLP 05/05/2014, 1:10 PM  [facility] [address] [place], [place], [address] Phone: [phone]   Fax:  [phone]

## 2014-05-05 NOTE — Patient Instructions (Signed)
Tips for Talking with People who have Aphasia  . Say one thing at a time . Don't  rush - slow down, be patient . Reduce background noise . Relax - be natural . Use pen and paper . Write down key words . Draw diagrams or pictures . Don't pretend you understand . Ask what helps . Recap - check you both understand   Describing words  What group does it belong to?  What do I use it for?  Where can I find it?  What does it LOOK like?  What other words go with it?  What is the 1st sound of the word?  Many Ways to Communicate  Describe it Write it Draw it Gesture it Use related words    Helpful Apps: [employer] Feud, Heads up, Stop-fun categories, What if, [employer]

## 2014-05-10 ENCOUNTER — Ambulatory Visit: Payer: Medicare Other | Attending: Neurology

## 2014-05-10 DIAGNOSIS — R279 Unspecified lack of coordination: Secondary | ICD-10-CM | POA: Diagnosis not present

## 2014-05-10 DIAGNOSIS — R4701 Aphasia: Secondary | ICD-10-CM | POA: Insufficient documentation

## 2014-05-10 DIAGNOSIS — I6980 Unspecified sequelae of other cerebrovascular disease: Secondary | ICD-10-CM | POA: Insufficient documentation

## 2014-05-10 DIAGNOSIS — R29898 Other symptoms and signs involving the musculoskeletal system: Secondary | ICD-10-CM | POA: Insufficient documentation

## 2014-05-10 NOTE — Therapy (Signed)
[facility] [address] [place], [place], [address] Phone: [phone]   Fax:  [phone]  Speech Language Pathology Treatment  Patient Details  Name: Danielle [name] MRN: [id number] Date of Birth: [birth year] Referring Provider:  [name], MD  Encounter Date: 05/10/2014      End of Session - 05/10/14 1139    Visit Number 2   Number of Visits 16   Date for SLP Re-Evaluation 07/04/14   SLP Start Time 0847   SLP Stop Time  0931   SLP Time Calculation (min) 44 min   Activity Tolerance Patient tolerated treatment well      Past Medical History  Diagnosis Date  . Hypertension   . Anemia   . H/O: pneumonia   . History of tobacco abuse quit 10/08    on nicotine patch  . Fibromyalgia   . Hypokalemia   . DJD (degenerative joint disease), cervical   . Thrush 12/11/2010  . 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 had chemo and radiation for lung ca  . CVA (cerebral vascular accident) 04/2014    pt states had 2 cva's within 2 wks    Past Surgical History  Procedure Laterality Date  . Cervical laminectomy  1995  . Other surgical history  2008    Gamma knife surgery to remove brain met   . 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 Knee surgery  1990    Left x 2  . Portacath placement  -[name]    TIP IN CAVOATRIAL JUNCTION  . Loop recorder implant N/A 04/19/2014    Procedure: LOOP RECORDER IMPLANT;  Surgeon: Thompson Grayer, MD;  Location: [facility] CATH LAB;  Service: Cardiovascular;  Laterality: N/A;  . Tee without cardioversion N/A 04/19/2014    Procedure: TRANSESOPHAGEAL ECHOCARDIOGRAM (TEE);  Surgeon: Larey Dresser, MD;  Location: [facility];  Service: Cardiovascular;  Laterality: N/A;    There were no vitals filed for this visit.  Visit Diagnosis: Aphasia      Subjective Assessment - 05/10/14 0849    Subjective Pt said daughter in [place] name  correctly.   Patient is accompained by: Family member  Leanna               ADULT SLP TREATMENT - 05/10/14 0850    General Information   Behavior/Cognition Alert;Cooperative;Pleasant mood   HPI Pt s/p brain tumor/resection without subsequent language difficulty.   Treatment Provided   Treatment provided Cognitive-Linquistic   Pain Assessment   Pain Assessment No/denies pain   Cognitive-Linquistic Treatment   Treatment focused on Aphasia   Skilled Treatment (Sp tx>30 minutes) Confrontation naming today 45% success independently and 65% with mod cues, and 85% with max cues. SLP further drilled pt with items named in error with mod-max A needed for success, usually. Pt awareness of errors 20% Written cues most beneficial to pt when error awareness lacking. (home management 10 minutes) SLP discussed with dtr in law and pt for pt to write sticky notes iwth names of common objects and place over the house to incr awareness/linguistic recognition of name with item.   Assessment / Recommendations / Plan   Plan Continue with current plan of care   Progression Toward Goals   Progression toward goals Progressing toward goals          SLP Education - 05/10/14 1138    Education provided  Yes   Education Details sticky notes, cueing heirarch   Person(s) Educated Patient;Caregiver(s)   Methods Explanation;Demonstration   Comprehension Verbalized understanding;Verbal cues required          SLP Short Term Goals - 05/10/14 1141    SLP SHORT TERM GOAL #1   Title pt engage in confrontation naming (non-common items) 85% success   Time 4   Period Weeks   Status On-going   SLP SHORT TERM GOAL #2   Title pt generate simple sentence responses with 80% success and rare min A   Time 4   Period Weeks   Status On-going   SLP SHORT TERM GOAL #3   Title pt participate in functional simple conversation (using verbal expression strategeies) for 8 minutes with rare min A    Time 4   Period  Weeks   Status On-going   SLP SHORT TERM GOAL #4   Title pt will demo error awareness in conversation 90% of the time   Time 4   Period Weeks   Status On-going          SLP Long Term Goals - 05/10/14 1141    SLP LONG TERM GOAL #1   Title pt will participate in 8 minutes functional mod complex conversation with modified independence (compensations)   Time 8   Period Weeks   Status On-going   SLP LONG TERM GOAL #2   Title pt will use compensations for expressive aphasia in mod complex/complex conversation with rare min a to do so   Time 8   Period Weeks   Status On-going   SLP LONG TERM GOAL #3   Title pt will demo understanding of mod complex conversation with rare repeats necessary   Time 8   Period Weeks   Status On-going          Plan - 05/10/14 1139    Clinical Impression Statement Mod expressive aphasia continues. SLP focused part of sesion on direct remediation of deficits, and part of session on tasks and cueing to use at home. Pt cont to benefit from skilled ST addressing expressive langauge and awareness of errors.   Speech Therapy Frequency 2x / week   Duration --  8 weeks   Treatment/Interventions Language facilitation;Cueing hierarchy;Functional tasks;Patient/family education;Compensatory strategies;SLP instruction and feedback   Potential to Achieve Goals Good        Problem List Patient Active Problem List   Diagnosis Date Noted  . UTI (urinary tract infection) 04/26/2014  . Brain lesion   . Expressive aphasia   . Cerebral infarction due to embolism of cerebral artery   . HLD (hyperlipidemia)   . Acute encephalopathy 04/17/2014  . Acute CVA (cerebrovascular accident) 04/17/2014  . AKI (acute kidney injury) 04/16/2014  . Cancer-related pain 12/28/2013  . Atherosclerosis of aorta 08/13/2013  . Cancer of upper lobe of right lung 11/19/2010  . BENIGN POSITIONAL VERTIGO 06/13/2009  . CONSTIPATION 04/07/2009  . Diabetes mellitus 09/29/2008  .  HYPERCHOLESTEROLEMIA 09/29/2008  . INSOMNIA 02/22/2008  . ALLERGIC CONJUNCTIVITIS 01/20/2008  . PULMONARY NOCARDIOSIS 01/07/2008  . Essential hypertension 01/07/2008  . ANEMIA-NOS 03/31/2006  . LUNG CANCER, HX OF 03/31/2006  . PNEUMONIA, HX OF 03/31/2006  . Lenn Cal OF 03/31/2006    [facility] , SLP  05/10/2014, 11:43 AM  [facility] [address] [place] [address] [place], [place], [address] Phone: [phone]   Fax:  [phone]

## 2014-05-11 ENCOUNTER — Other Ambulatory Visit: Payer: Self-pay | Admitting: Internal Medicine

## 2014-05-12 ENCOUNTER — Ambulatory Visit: Payer: Medicare Other | Admitting: Speech Pathology

## 2014-05-12 ENCOUNTER — Telehealth: Payer: Self-pay | Admitting: Family Medicine

## 2014-05-12 DIAGNOSIS — R4701 Aphasia: Secondary | ICD-10-CM

## 2014-05-12 DIAGNOSIS — R279 Unspecified lack of coordination: Secondary | ICD-10-CM | POA: Diagnosis not present

## 2014-05-12 DIAGNOSIS — I6980 Unspecified sequelae of other cerebrovascular disease: Secondary | ICD-10-CM | POA: Diagnosis not present

## 2014-05-12 DIAGNOSIS — R29898 Other symptoms and signs involving the musculoskeletal system: Secondary | ICD-10-CM | POA: Diagnosis not present

## 2014-05-12 NOTE — Telephone Encounter (Signed)
-----   Message from Ellamae Sia sent at 05/06/2014  3:57 PM EDT ----- Regarding: Lab orders for Friday, 5.6.16 Patient is scheduled for CPX labs, please order future labs, Thanks , Karna Christmas

## 2014-05-12 NOTE — Therapy (Signed)
[name] [address] [place], [place], [address] Phone: [phone]   Fax:  [phone]  Speech Language Pathology Treatment  Patient Details  Name: Danielle [name] MRN: [id number] Date of Birth: Sep 29, 1950 Referring Provider:  [name], MD  Encounter Date: 05/12/2014      End of Session - 05/12/14 1319    Visit Number 3   Number of Visits 16   Date for SLP Re-Evaluation 07/04/14   SLP Start Time 0930   SLP Stop Time  1012   SLP Time Calculation (min) 42 min   Activity Tolerance Patient tolerated treatment well      Past Medical History  Diagnosis Date  . Hypertension   . Anemia   . H/O: pneumonia   . History of tobacco abuse quit 10/08    on nicotine patch  . Fibromyalgia   . Hypokalemia   . DJD (degenerative joint disease), cervical   . Thrush 12/11/2010  . 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 had chemo and radiation for lung ca  . CVA (cerebral vascular accident) 04/2014    pt states had 2 cva's within 2 wks    Past Surgical History  Procedure Laterality Date  . Cervical laminectomy  1995  . Other surgical history  2008    Gamma knife surgery to remove brain met   . 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 Knee surgery  1990    Left x 2  . Portacath placement  -[name]    TIP IN CAVOATRIAL JUNCTION  . Loop recorder implant N/A 04/19/2014    Procedure: LOOP RECORDER IMPLANT;  Surgeon: Thompson Grayer, MD;  Location: [facility] CATH LAB;  Service: Cardiovascular;  Laterality: N/A;  . Tee without cardioversion N/A 04/19/2014    Procedure: TRANSESOPHAGEAL ECHOCARDIOGRAM (TEE);  Surgeon: Larey Dresser, MD;  Location: [facility];  Service: Cardiovascular;  Laterality: N/A;    There were no vitals filed for this visit.  Visit Diagnosis: Aphasia      Subjective Assessment - 05/12/14 0936    Subjective "I have cards up all over my  house"   Currently in Pain? Yes   Pain Score 7    Pain Location Back   Pain Type Chronic pain   Pain Onset More than a month ago   Pain Frequency Constant               ADULT SLP TREATMENT - 05/12/14 0937    General Information   Behavior/Cognition Alert;Cooperative;Pleasant mood   Treatment Provided   Treatment provided Cognitive-Linquistic   Cognitive-Linquistic Treatment   Treatment focused on Aphasia   Skilled Treatment Confrontation naming with low frequency words with usual min to mod A - descriptions and written cues - When written cues were required, facilitated naming with leaving blanks for some letters, pt filled in missing letters with rare min A after word was identified. Structured language tasks with pt describing animals for ST to guess with frequent mod A and questioning by ST. After encouragement, pt also utlized gestures to help communicate/describe. Pt named animal ST described with usual mod A and 60% accuracy. Again this improved  with written cues.   Assessment / Recommendations / Plan   Plan Continue with current plan of care   Progression Toward Goals   Progression toward goals Progressing toward goals  SLP Education - 05/12/14 1318    Education provided Yes   Education Details compensations for aphasia   Person(s) Educated Patient;Caregiver(s)   Methods Explanation;Demonstration   Comprehension Verbalized understanding;Verbal cues required          SLP Short Term Goals - 05/12/14 1319    SLP SHORT TERM GOAL #1   Title pt engage in confrontation naming (non-common items) 85% success   Time 4   Period Weeks   Status On-going   SLP SHORT TERM GOAL #2   Title pt generate simple sentence responses with 80% success and rare min A   Time 4   Period Weeks   Status On-going   SLP SHORT TERM GOAL #3   Title pt participate in functional simple conversation (using verbal expression strategeies) for 8 minutes with rare min A    Time 4    Period Weeks   Status On-going   SLP SHORT TERM GOAL #4   Title pt will demo error awareness in conversation 90% of the time   Time 4   Period Weeks   Status On-going          SLP Long Term Goals - 05/12/14 1319    SLP LONG TERM GOAL #1   Title pt will participate in 8 minutes functional mod complex conversation with modified independence (compensations)   Time 8   Period Weeks   Status On-going   SLP LONG TERM GOAL #2   Title pt will use compensations for expressive aphasia in mod complex/complex conversation with rare min a to do so   Time 8   Period Weeks   Status On-going   SLP LONG TERM GOAL #3   Title pt will demo understanding of mod complex conversation with rare repeats necessary   Time 8   Period Weeks   Status On-going          Plan - 05/12/14 1319    Speech Therapy Frequency 2x / week   Treatment/Interventions Language facilitation;Cueing hierarchy;Functional tasks;Patient/family education;Compensatory strategies;SLP instruction and feedback   Potential to Achieve Goals Good        Problem List Patient Active Problem List   Diagnosis Date Noted  . UTI (urinary tract infection) 04/26/2014  . Brain lesion   . Expressive aphasia   . Cerebral infarction due to embolism of cerebral artery   . HLD (hyperlipidemia)   . Acute encephalopathy 04/17/2014  . Acute CVA (cerebrovascular accident) 04/17/2014  . AKI (acute kidney injury) 04/16/2014  . Cancer-related pain 12/28/2013  . Atherosclerosis of aorta 08/13/2013  . Cancer of upper lobe of right lung 11/19/2010  . BENIGN POSITIONAL VERTIGO 06/13/2009  . CONSTIPATION 04/07/2009  . Diabetes mellitus 09/29/2008  . HYPERCHOLESTEROLEMIA 09/29/2008  . INSOMNIA 02/22/2008  . ALLERGIC CONJUNCTIVITIS 01/20/2008  . PULMONARY NOCARDIOSIS 01/07/2008  . Essential hypertension 01/07/2008  . ANEMIA-NOS 03/31/2006  . LUNG CANCER, HX OF 03/31/2006  . PNEUMONIA, HX OF 03/31/2006  . LAMINECTOMY, HX OF 03/31/2006     Danielle [name], Annye Rusk, SLP 05/12/2014, 1:20 PM  [facility] [address] [place], [place], [address] Phone: [phone]   Fax:  [phone]

## 2014-05-12 NOTE — Patient Instructions (Signed)
Homework packet provided, pt to bring packet back to either ST

## 2014-05-12 NOTE — Telephone Encounter (Signed)
Call pt.. She does not need labs prior to CPX as she had extensive labs in hospital in 04/2014.

## 2014-05-13 ENCOUNTER — Telehealth: Payer: Self-pay | Admitting: *Deleted

## 2014-05-13 ENCOUNTER — Encounter: Payer: Self-pay | Admitting: Internal Medicine

## 2014-05-13 ENCOUNTER — Other Ambulatory Visit: Payer: Medicare Other

## 2014-05-13 ENCOUNTER — Other Ambulatory Visit: Payer: Self-pay | Admitting: Medical Oncology

## 2014-05-13 DIAGNOSIS — C3410 Malignant neoplasm of upper lobe, unspecified bronchus or lung: Secondary | ICD-10-CM

## 2014-05-13 MED ORDER — ASPIRIN 325 MG PO TBEC: 325.0000 mg | DELAYED_RELEASE_TABLET | Freq: Every day | ORAL | Status: DC

## 2014-05-13 MED ORDER — ONDANSETRON HCL 8 MG PO TABS: 8.0000 mg | ORAL_TABLET | Freq: Two times a day (BID) | ORAL | Status: DC | PRN

## 2014-05-13 MED ORDER — ALPRAZOLAM 0.25 MG PO TABS: 0.2500 mg | ORAL_TABLET | Freq: Every evening | ORAL | Status: DC | PRN

## 2014-05-13 MED ORDER — MORPHINE SULFATE ER 60 MG PO TBCR: 60.0000 mg | EXTENDED_RELEASE_TABLET | Freq: Two times a day (BID) | ORAL | Status: DC

## 2014-05-13 MED ORDER — MORPHINE SULFATE 30 MG PO TABS: 30.0000 mg | ORAL_TABLET | ORAL | Status: DC | PRN

## 2014-05-13 NOTE — Telephone Encounter (Signed)
TC from patient. Needs her Morphine 30 mg 1 tab every 4 hours as needed prn refilled and her Morphine 60 mg 1 tablet q 12 hours refilled. Also needed Xanax, Aspirin and odansetron refilled. This RN ordered refill the last 3.

## 2014-05-13 NOTE — Telephone Encounter (Signed)
Patient arrived for her lab appointment this morning at 7:35 am.  Aniceto Boss saw the note and informed patient at that time.

## 2014-05-16 DIAGNOSIS — C7931 Secondary malignant neoplasm of brain: Secondary | ICD-10-CM | POA: Diagnosis not present

## 2014-05-16 DIAGNOSIS — C3490 Malignant neoplasm of unspecified part of unspecified bronchus or lung: Secondary | ICD-10-CM | POA: Diagnosis not present

## 2014-05-17 ENCOUNTER — Ambulatory Visit: Payer: Medicare Other | Admitting: Speech Pathology

## 2014-05-17 DIAGNOSIS — I6980 Unspecified sequelae of other cerebrovascular disease: Secondary | ICD-10-CM | POA: Diagnosis not present

## 2014-05-17 DIAGNOSIS — R4701 Aphasia: Secondary | ICD-10-CM

## 2014-05-17 DIAGNOSIS — R29898 Other symptoms and signs involving the musculoskeletal system: Secondary | ICD-10-CM | POA: Diagnosis not present

## 2014-05-17 DIAGNOSIS — R279 Unspecified lack of coordination: Secondary | ICD-10-CM | POA: Diagnosis not present

## 2014-05-17 NOTE — Therapy (Signed)
[facility] [address] [place], [place], [address] Phone: [phone]   Fax:  [phone]  Speech Language Pathology Treatment  Patient Details  Name: Danielle [name] MRN: [id number] Date of Birth: 04-23-1950 Referring Provider:  [name], MD  Encounter Date: 05/17/2014      End of Session - 05/17/14 1019    Visit Number 4   Number of Visits 16   Date for SLP Re-Evaluation 07/04/14   SLP Start Time 0932   SLP Stop Time  1015   SLP Time Calculation (min) 43 min   Activity Tolerance Patient tolerated treatment well      Past Medical History  Diagnosis Date  . Hypertension   . Anemia   . H/O: pneumonia   . History of tobacco abuse quit 10/08    on nicotine patch  . Fibromyalgia   . Hypokalemia   . DJD (degenerative joint disease), cervical   . Thrush 12/11/2010  . 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 had chemo and radiation for lung ca  . CVA (cerebral vascular accident) 04/2014    pt states had 2 cva's within 2 wks    Past Surgical History  Procedure Laterality Date  . Cervical laminectomy  1995  . Other surgical history  2008    Gamma knife surgery to remove brain met   . 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 Knee surgery  1990    Left x 2  . Portacath placement  -[name]    TIP IN CAVOATRIAL JUNCTION  . Loop recorder implant N/A 04/19/2014    Procedure: LOOP RECORDER IMPLANT;  Surgeon: Thompson Grayer, MD;  Location: [facility];  Service: Cardiovascular;  Laterality: N/A;  . Tee without cardioversion N/A 04/19/2014    Procedure: TRANSESOPHAGEAL ECHOCARDIOGRAM (TEE);  Surgeon: Larey Dresser, MD;  Location: [facility];  Service: Cardiovascular;  Laterality: N/A;    There were no vitals filed for this visit.  Visit Diagnosis: Aphasia      Subjective Assessment - 05/17/14 1001    Subjective "I don't know"                ADULT SLP TREATMENT - 05/17/14 1002    General Information   Behavior/Cognition Alert;Cooperative;Pleasant mood   Treatment Provided   Treatment provided Cognitive-Linquistic   Pain Assessment   Pain Assessment 0-10   Pain Score 3    Pain Location back   Pain Descriptors / Indicators Aching;Constant   Pain Intervention(s) Monitored during session   Cognitive-Linquistic Treatment   Treatment focused on Aphasia   Skilled Treatment Name 5 items in mildly complex categories with frequent mod A, cues. Write sentence with given word after she verbalized a simple sentence 85% with simple high frequency words. Facilitated use of compensations in simple conversation cueing pt to use descriptions during anomic episodes with usual min questioning cues   Assessment / Recommendations / Plan   Plan Continue with current plan of care   Progression Toward Goals   Progression toward goals Progressing toward goals          SLP Education - 05/17/14 1016    Education provided Yes   Education Details compensations for aphasia   Person(s) Educated Patient;Spouse   Methods Explanation;Demonstration          SLP Short Term Goals - 05/17/14 1018    SLP SHORT  TERM GOAL #1   Title pt engage in confrontation naming (non-common items) 85% success   Time 3   Period Weeks   Status On-going   SLP SHORT TERM GOAL #2   Title pt generate simple sentence responses with 80% success and rare min A   Time 3   Period Weeks   Status On-going   SLP SHORT TERM GOAL #3   Title pt participate in functional simple conversation (using verbal expression strategeies) for 8 minutes with rare min A    Time 3   Period Weeks   Status On-going   SLP SHORT TERM GOAL #4   Title pt will demo error awareness in conversation 90% of the time   Time 3   Period Weeks   Status On-going          SLP Long Term Goals - 05/17/14 1019    SLP LONG TERM GOAL #1   Title pt will participate in 8 minutes  functional mod complex conversation with modified independence (compensations)   Time 7   Period Weeks   Status On-going   SLP LONG TERM GOAL #2   Title pt will use compensations for expressive aphasia in mod complex/complex conversation with rare min a to do so   Time 7   Period Weeks   Status On-going   SLP LONG TERM GOAL #3   Title pt will demo understanding of mod complex conversation with rare repeats necessary   Time 7   Period Weeks   Status On-going          Plan - 05/17/14 1016    Clinical Impression Statement Pt continues to require mod A for error awareness, awareness of auditory comprehension impairment and verbal expression. Continue skilled ST to maximize communication and reduce caregiver burden.   Speech Therapy Frequency 2x / week   Treatment/Interventions Language facilitation;Cueing hierarchy;Functional tasks;Patient/family education;Compensatory strategies;SLP instruction and feedback   Potential to Achieve Goals Good        Problem List Patient Active Problem List   Diagnosis Date Noted  . UTI (urinary tract infection) 04/26/2014  . Brain lesion   . Expressive aphasia   . Cerebral infarction due to embolism of cerebral artery   . HLD (hyperlipidemia)   . Acute encephalopathy 04/17/2014  . Acute CVA (cerebrovascular accident) 04/17/2014  . AKI (acute kidney injury) 04/16/2014  . Cancer-related pain 12/28/2013  . Atherosclerosis of aorta 08/13/2013  . Cancer of upper lobe of right lung 11/19/2010  . BENIGN POSITIONAL VERTIGO 06/13/2009  . CONSTIPATION 04/07/2009  . Diabetes mellitus 09/29/2008  . HYPERCHOLESTEROLEMIA 09/29/2008  . INSOMNIA 02/22/2008  . ALLERGIC CONJUNCTIVITIS 01/20/2008  . PULMONARY NOCARDIOSIS 01/07/2008  . Essential hypertension 01/07/2008  . ANEMIA-NOS 03/31/2006  . LUNG CANCER, HX OF 03/31/2006  . PNEUMONIA, HX OF 03/31/2006  . LAMINECTOMY, HX OF 03/31/2006    Lovvorn, Annye Rusk , SLP  05/17/2014, 10:20 AM  [facility] [address] [place], [place], [address] Phone: [phone]   Fax:  [phone]

## 2014-05-17 NOTE — Patient Instructions (Signed)
Written expression packet provided

## 2014-05-19 ENCOUNTER — Ambulatory Visit: Payer: Medicare Other

## 2014-05-19 ENCOUNTER — Ambulatory Visit (INDEPENDENT_AMBULATORY_CARE_PROVIDER_SITE_OTHER): Payer: Medicare Other | Admitting: *Deleted

## 2014-05-19 DIAGNOSIS — R29898 Other symptoms and signs involving the musculoskeletal system: Secondary | ICD-10-CM | POA: Diagnosis not present

## 2014-05-19 DIAGNOSIS — R279 Unspecified lack of coordination: Secondary | ICD-10-CM | POA: Diagnosis not present

## 2014-05-19 DIAGNOSIS — I639 Cerebral infarction, unspecified: Secondary | ICD-10-CM

## 2014-05-19 DIAGNOSIS — R4701 Aphasia: Secondary | ICD-10-CM

## 2014-05-19 DIAGNOSIS — I6980 Unspecified sequelae of other cerebrovascular disease: Secondary | ICD-10-CM | POA: Diagnosis not present

## 2014-05-19 NOTE — Therapy (Signed)
[facility] [address] [place], [place], [address] Phone: [phone]   Fax:  [phone]  Speech Language Pathology Treatment  Patient Details  Name: Danielle [name] MRN: [id number] Date of Birth: 05/28/1950 Referring Provider:  [name], MD  Encounter Date: 05/19/2014      End of Session - 05/19/14 1026    Visit Number 5   Number of Visits 16   Date for SLP Re-Evaluation 07/04/14   SLP Start Time 0847   SLP Stop Time  0930   SLP Time Calculation (min) 43 min   Activity Tolerance Patient tolerated treatment well      Past Medical History  Diagnosis Date  . Hypertension   . Anemia   . H/O: pneumonia   . History of tobacco abuse quit 10/08    on nicotine patch  . Fibromyalgia   . Hypokalemia   . DJD (degenerative joint disease), cervical   . Thrush 12/11/2010  . 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 had chemo and radiation for lung ca  . CVA (cerebral vascular accident) 04/2014    pt states had 2 cva's within 2 wks    Past Surgical History  Procedure Laterality Date  . Cervical laminectomy  1995  . Other surgical history  2008    Gamma knife surgery to remove brain met   . 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 Knee surgery  1990    Left x 2  . Portacath placement  -[name]    TIP IN CAVOATRIAL JUNCTION  . Loop recorder implant N/A 04/19/2014    Procedure: LOOP RECORDER IMPLANT;  Surgeon: Thompson Grayer, MD;  Location: [facility] CATH LAB;  Service: Cardiovascular;  Laterality: N/A;  . Tee without cardioversion N/A 04/19/2014    Procedure: TRANSESOPHAGEAL ECHOCARDIOGRAM (TEE);  Surgeon: Larey Dresser, MD;  Location: [facility];  Service: Cardiovascular;  Laterality: N/A;    There were no vitals filed for this visit.  Visit Diagnosis: Aphasia      Subjective Assessment - 05/19/14 0853    Subjective "I think it is better" (speech  better than prior to tx)   Patient is accompained by: Family member  Leanna               ADULT SLP TREATMENT - 05/19/14 0859    General Information   Behavior/Cognition Alert;Cooperative;Pleasant mood   Treatment Provided   Treatment provided Cognitive-Linquistic   Pain Assessment   Pain Assessment 0-10   Pain Score 7    Pain Location back   Pain Descriptors / Indicators Aching   Pain Intervention(s) Monitored during session   Cognitive-Linquistic Treatment   Treatment focused on Aphasia   Skilled Treatment SLP engaged pt i nconversation re: her yellow sticky notes. Unaware of errors 60%. Responsive naming 75% and improved with semantic, phonemic, and visual cues usually. Pt's error awareness 40%. Divergent naming 5 items-  pt benefitted from occasional verbal and visual cues from SLP   Assessment / Recommendations / [place] with current plan of care   Progression Toward Goals   Progression toward goals Progressing toward goals            SLP Short Term Goals - 05/19/14 1027    SLP SHORT TERM GOAL #1   Title pt engage in confrontation naming (non-common items) 85% success   Time 3  Period Weeks   Status On-going   SLP SHORT TERM GOAL #2   Title pt generate simple sentence responses with 80% success and rare min A   Time 3   Period Weeks   Status On-going   SLP SHORT TERM GOAL #3   Title pt participate in functional simple conversation (using verbal expression strategeies) for 8 minutes with rare min A    Time 3   Period Weeks   Status On-going   SLP SHORT TERM GOAL #4   Title pt will demo error awareness in conversation 90% of the time   Time 3   Period Weeks   Status On-going          SLP Long Term Goals - 05/19/14 1028    SLP LONG TERM GOAL #1   Title pt will participate in 8 minutes functional mod complex conversation with modified independence (compensations)   Time 7   Period Weeks   Status On-going   SLP LONG TERM GOAL #2    Title pt will use compensations for expressive aphasia in mod complex/complex conversation with rare min a to do so   Time 7   Period Weeks   Status On-going   SLP LONG TERM GOAL #3   Title pt will demo understanding of mod complex conversation with rare repeats necessary   Time 7   Period Weeks   Status On-going          Plan - 05/19/14 1026    Clinical Impression Statement Pt continues to require mod A for error awareness, awareness of auditory comprehension impairment and verbal expression. Continue skilled ST to maximize communication and reduce caregiver burden.   Speech Therapy Frequency 2x / week   Duration --  7 weeks   Treatment/Interventions Language facilitation;Cueing hierarchy;Functional tasks;Patient/family education;Compensatory strategies;SLP instruction and feedback   Potential to Achieve Goals Good        Problem List Patient Active Problem List   Diagnosis Date Noted  . UTI (urinary tract infection) 04/26/2014  . Brain lesion   . Expressive aphasia   . Cerebral infarction due to embolism of cerebral artery   . HLD (hyperlipidemia)   . Acute encephalopathy 04/17/2014  . Acute CVA (cerebrovascular accident) 04/17/2014  . AKI (acute kidney injury) 04/16/2014  . Cancer-related pain 12/28/2013  . Atherosclerosis of aorta 08/13/2013  . Cancer of upper lobe of right lung 11/19/2010  . BENIGN POSITIONAL VERTIGO 06/13/2009  . CONSTIPATION 04/07/2009  . Diabetes mellitus 09/29/2008  . HYPERCHOLESTEROLEMIA 09/29/2008  . INSOMNIA 02/22/2008  . ALLERGIC CONJUNCTIVITIS 01/20/2008  . PULMONARY NOCARDIOSIS 01/07/2008  . Essential hypertension 01/07/2008  . ANEMIA-NOS 03/31/2006  . LUNG CANCER, HX OF 03/31/2006  . PNEUMONIA, HX OF 03/31/2006  . Lenn Cal OF 03/31/2006    [facility], SLP 05/19/2014, 10:29 AM  [facility] [address] [place], [place], [address] Phone: [phone]   Fax:   [phone]

## 2014-05-19 NOTE — Patient Instructions (Signed)
  Please complete the assigned speech therapy homework and return it to your next session.  

## 2014-05-20 ENCOUNTER — Ambulatory Visit (INDEPENDENT_AMBULATORY_CARE_PROVIDER_SITE_OTHER): Payer: Medicare Other | Admitting: Family Medicine

## 2014-05-20 ENCOUNTER — Other Ambulatory Visit: Payer: Self-pay

## 2014-05-20 ENCOUNTER — Other Ambulatory Visit: Payer: Self-pay | Admitting: Internal Medicine

## 2014-05-20 ENCOUNTER — Encounter: Payer: Self-pay | Admitting: Family Medicine

## 2014-05-20 VITALS — BP 100/62 | HR 106 | Temp 97.9°F | Ht 63.0 in | Wt 156.2 lb

## 2014-05-20 DIAGNOSIS — E7800 Pure hypercholesterolemia, unspecified: Secondary | ICD-10-CM

## 2014-05-20 DIAGNOSIS — I639 Cerebral infarction, unspecified: Secondary | ICD-10-CM

## 2014-05-20 DIAGNOSIS — N179 Acute kidney failure, unspecified: Secondary | ICD-10-CM

## 2014-05-20 DIAGNOSIS — I10 Essential (primary) hypertension: Secondary | ICD-10-CM | POA: Diagnosis not present

## 2014-05-20 DIAGNOSIS — R4701 Aphasia: Secondary | ICD-10-CM

## 2014-05-20 NOTE — Telephone Encounter (Signed)
pts daughter in law called back; do not see DPR signed for daughter in law; advised daughter in law to speak with pt.

## 2014-05-20 NOTE — Assessment & Plan Note (Signed)
Well controlled. Continue current medication.  

## 2014-05-20 NOTE — Telephone Encounter (Signed)
I do not prescribe this med.

## 2014-05-20 NOTE — Telephone Encounter (Signed)
Pt's daughter in law left v/m requesting refill Vimpat; pt was seen this AM and pt forgot to ask for refill; [facility] sent refill request for Vimpat to Dr Oren Binet. Pt is out of med.Please advise.

## 2014-05-20 NOTE — Assessment & Plan Note (Signed)
No SE with increase in dose of atorvastatin. Will re-eval in 07/2014.

## 2014-05-20 NOTE — Telephone Encounter (Signed)
Ms. Holford notified refill will need to come from Dr. Sloan Leiter.

## 2014-05-20 NOTE — Assessment & Plan Note (Signed)
Resolved at last check with fluids.

## 2014-05-20 NOTE — Progress Notes (Signed)
Loop recorder 

## 2014-05-20 NOTE — Progress Notes (Signed)
Subjective:    Patient ID: Danielle [name], female    DOB: 24-Mar-1950, 64 y.o.   MRN: [id number]  HPI  64 year old female with metastatic lung cancer presents for  follow up.  She was recently admitted  from April 9 to 12 2016 after ER visit for slurred speech and confusion. 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 Although improved,continued to have nonsensical speech at times.Transthoracic echocardiogram and trans-esophageal Echo negative for a embolic etiology. Carotid Doppler did not show significant stenosis. 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 Continue Aspirin on discharge. LDL not at goal, at 105-continue Lipitor 20 mg on discharge. A1C 6.1.   At last OV for hospital follow up on 4/19: Lipitor was increased to 40 mg daily Acute renal failure had resolved with fluids..   She reports that she has been doing well.   Recently saw neurosurgeon on Monday: saw healing CVA  On MRI. Memory improved, Aphasia is improving some.  She is getting speech therapy twice a week.  No SE on higher dose chol medication.  Has loop recorder in place and follow up with cards in July.  Wt Readings from Last 3 Encounters:  05/20/14 156 lb 4 oz (70.875 kg)  05/03/14 153 lb 6.4 oz (69.582 kg)  04/26/14 155 lb 4 oz (70.421 kg)   Not checking CBGs at home  Lab Results  Component Value Date   HGBA1C 6.1* 04/18/2014  Minimal exercise, good diet.   Review of Systems  Constitutional: Negative for fever and fatigue.  HENT: Negative for ear pain.   Eyes: Negative for pain.  Respiratory: Negative for chest tightness and shortness of breath.   Cardiovascular: Negative for chest pain, palpitations and leg swelling.  Gastrointestinal: Negative for abdominal pain.  Genitourinary: Negative for dysuria.       Objective:   Physical Exam    Constitutional: Vital signs are normal. She appears well-developed and well-nourished. She is cooperative.  Non-toxic appearance. She does not appear ill. No distress.  HENT:  Head: Normocephalic.  Right Ear: Hearing, tympanic membrane, external ear and ear canal normal. Tympanic membrane is not erythematous, not retracted and not bulging.  Left Ear: Hearing, tympanic membrane, external ear and ear canal normal. Tympanic membrane is not erythematous, not retracted and not bulging.  Nose: No mucosal edema or rhinorrhea. Right sinus exhibits no maxillary sinus tenderness and no frontal sinus tenderness. Left sinus exhibits no maxillary sinus tenderness and no frontal sinus tenderness.  Mouth/Throat: Uvula is midline, oropharynx is clear and moist and mucous membranes are normal.  Eyes: Conjunctivae, EOM and lids are normal. Pupils are equal, round, and reactive to light. Lids are everted and swept, no foreign bodies found.  Neck: Trachea normal and normal range of motion. Neck supple. Carotid bruit is not present. No thyroid mass and no thyromegaly present.  Cardiovascular: Normal rate, regular rhythm, S1 normal, S2 normal, normal heart sounds, intact distal pulses and normal pulses.  Exam reveals no gallop and no friction rub.   No murmur heard. Pulmonary/Chest: Effort normal. No tachypnea. No respiratory distress. She has decreased breath sounds. She has no wheezes. She has no rhonchi. She has no rales.  Unchanged from previous exam  Abdominal: Soft. Normal appearance and bowel sounds are normal. There is no tenderness.  Neurological: She is alert.  Skin: Skin is warm, dry and  intact. No rash noted.  Psychiatric: Her speech is normal and behavior is normal. Judgment and thought content normal. Her mood appears not anxious. Cognition and memory are normal. She does not exhibit a depressed mood.          Assessment & Plan:

## 2014-05-20 NOTE — Patient Instructions (Signed)
If not already schedule make a lab appt prior to next appt with me in July.   Continue working on healthy low carb , low cholesterol diet.  Exercise as tolerated.

## 2014-05-20 NOTE — Assessment & Plan Note (Signed)
Improved with speech therapy.

## 2014-05-20 NOTE — Assessment & Plan Note (Signed)
Loop recorder in place. Followed by cards.

## 2014-05-20 NOTE — Progress Notes (Signed)
Pre visit review using our clinic review tool, if applicable. No additional management support is needed unless otherwise documented below in the visit note. 

## 2014-05-23 ENCOUNTER — Ambulatory Visit: Payer: Medicare Other

## 2014-05-23 DIAGNOSIS — R4701 Aphasia: Secondary | ICD-10-CM

## 2014-05-23 DIAGNOSIS — I6980 Unspecified sequelae of other cerebrovascular disease: Secondary | ICD-10-CM | POA: Diagnosis not present

## 2014-05-23 DIAGNOSIS — R279 Unspecified lack of coordination: Secondary | ICD-10-CM | POA: Diagnosis not present

## 2014-05-23 DIAGNOSIS — R29898 Other symptoms and signs involving the musculoskeletal system: Secondary | ICD-10-CM | POA: Diagnosis not present

## 2014-05-23 NOTE — Therapy (Signed)
[name] [address] [place], [place], [address] Phone: [phone]   Fax:  [phone]  Speech Language Pathology Treatment  Patient Details  Name: Danielle [name] MRN: [id number] Date of Birth: 1950/05/20 Referring Provider:  [name], MD  Encounter Date: 05/23/2014      End of Session - 05/23/14 1128    Visit Number 6   Number of Visits 17   Date for SLP Re-Evaluation 07/04/14   SLP Start Time 27   SLP Stop Time  1100   SLP Time Calculation (min) 41 min   Activity Tolerance Patient tolerated treatment well      Past Medical History  Diagnosis Date  . Hypertension   . Anemia   . H/O: pneumonia   . History of tobacco abuse quit 10/08    on nicotine patch  . Fibromyalgia   . Hypokalemia   . DJD (degenerative joint disease), cervical   . Thrush 12/11/2010  . 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 had chemo and radiation for lung ca  . CVA (cerebral vascular accident) 04/2014    pt states had 2 cva's within 2 wks    Past Surgical History  Procedure Laterality Date  . Cervical laminectomy  1995  . Other surgical history  2008    Gamma knife surgery to remove brain met   . 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 Knee surgery  1990    Left x 2  . Portacath placement  -[name]    TIP IN CAVOATRIAL JUNCTION  . Loop recorder implant N/A 04/19/2014    Procedure: LOOP RECORDER IMPLANT;  Surgeon: Thompson Grayer, MD;  Location: [facility] CATH LAB;  Service: Cardiovascular;  Laterality: N/A;  . Tee without cardioversion N/A 04/19/2014    Procedure: TRANSESOPHAGEAL ECHOCARDIOGRAM (TEE);  Surgeon: Larey Dresser, MD;  Location: [facility];  Service: Cardiovascular;  Laterality: N/A;    There were no vitals filed for this visit.  Visit Diagnosis: Aphasia      Subjective Assessment - 05/23/14 1020    Subjective Pt reports that she thinks she  is catching more errors   Patient is accompained by: Family member  leanna, dtr in law               ADULT SLP TREATMENT - 05/23/14 1022    General Information   Behavior/Cognition Alert;Cooperative;Pleasant mood   Treatment Provided   Treatment provided Cognitive-Linquistic   Pain Assessment   Pain Assessment 0-10   Pain Score 6    Pain Location back   Pain Descriptors / Indicators Aching   Pain Intervention(s) Monitored during session   Cognitive-Linquistic Treatment   Treatment focused on Aphasia   Skilled Treatment Pt corrected homework with approx 75% error awareness. Pt benefitted from SLP semantic, spelling, and visual cues occasionally. Responsive naming 5/5 with verbal cues from SLP. Pt benefitted from semantic and description cues from SLP for divergent naming tasks in simple categories.   Assessment / Recommendations / Plan   Plan Continue with current plan of care   Progression Toward Goals   Progression toward goals Progressing toward goals            SLP Short Term Goals - 05/23/14 1131    SLP SHORT TERM GOAL #1   Title pt engage in confrontation naming (non-common items) 85% success   Time 2  Period Weeks   Status On-going   SLP SHORT TERM GOAL #2   Title pt generate simple sentence responses with 80% success and rare min A   Time 2   Period Weeks   Status On-going   SLP SHORT TERM GOAL #3   Title pt participate in functional simple conversation (using verbal expression strategeies) for 8 minutes with rare min A    Time 2   Period Weeks   Status On-going   SLP SHORT TERM GOAL #4   Title pt will demo error awareness in conversation 90% of the time   Time 2   Period Weeks   Status On-going          SLP Long Term Goals - 05/23/14 1131    SLP LONG TERM GOAL #1   Title pt will participate in 8 minutes functional mod complex conversation with modified independence (compensations)   Time 6   Period Weeks   Status On-going   SLP LONG TERM  GOAL #2   Title pt will use compensations for expressive aphasia in mod complex/complex conversation with rare min a to do so   Time 6   Period Weeks   Status On-going   SLP LONG TERM GOAL #3   Title pt will demo understanding of mod complex conversation with rare repeats necessary   Time 6   Period Weeks   Status On-going          Plan - 05/23/14 1128    Clinical Impression Statement Pt's error awareness has improved since last sessions. She cont with expressive > receptive language deficits, requiring SLP cues in structured speech tasks. Skilled ST remains necessary to improve pt's skills in thse areas and thus her QOL.   Speech Therapy Frequency 2x / week   Duration --  6 weeks   Treatment/Interventions Language facilitation;Cueing hierarchy;Functional tasks;Patient/family education;Compensatory strategies;SLP instruction and feedback   Potential to Achieve Goals Good   Potential Considerations Severity of impairments   Consulted and Agree with Plan of Care Patient        Problem List Patient Active Problem List   Diagnosis Date Noted  . Brain lesion   . Expressive aphasia   . Cerebral infarction due to embolism of cerebral artery   . HLD (hyperlipidemia)   . Acute encephalopathy 04/17/2014  . Acute CVA (cerebrovascular accident) 04/17/2014  . AKI (acute kidney injury) 04/16/2014  . Cancer-related pain 12/28/2013  . Atherosclerosis of aorta 08/13/2013  . Cancer of upper lobe of right lung 11/19/2010  . BENIGN POSITIONAL VERTIGO 06/13/2009  . CONSTIPATION 04/07/2009  . Diabetes mellitus 09/29/2008  . HYPERCHOLESTEROLEMIA 09/29/2008  . INSOMNIA 02/22/2008  . ALLERGIC CONJUNCTIVITIS 01/20/2008  . PULMONARY NOCARDIOSIS 01/07/2008  . Essential hypertension 01/07/2008  . ANEMIA-NOS 03/31/2006  . LUNG CANCER, HX OF 03/31/2006  . PNEUMONIA, HX OF 03/31/2006  . Lenn Cal OF 03/31/2006    [facility] , MS, CCC-SLP  05/23/2014, 11:32 AM  [facility] [address] [place], [place], [address] Phone: [phone]   Fax:  [phone]

## 2014-05-24 ENCOUNTER — Ambulatory Visit (HOSPITAL_BASED_OUTPATIENT_CLINIC_OR_DEPARTMENT_OTHER): Payer: Medicare Other | Admitting: Physician Assistant

## 2014-05-24 ENCOUNTER — Ambulatory Visit (HOSPITAL_BASED_OUTPATIENT_CLINIC_OR_DEPARTMENT_OTHER): Payer: Medicare Other

## 2014-05-24 ENCOUNTER — Telehealth: Payer: Self-pay | Admitting: Internal Medicine

## 2014-05-24 ENCOUNTER — Other Ambulatory Visit (HOSPITAL_BASED_OUTPATIENT_CLINIC_OR_DEPARTMENT_OTHER): Payer: Medicare Other

## 2014-05-24 ENCOUNTER — Encounter: Payer: Self-pay | Admitting: Physician Assistant

## 2014-05-24 ENCOUNTER — Other Ambulatory Visit: Payer: Self-pay | Admitting: *Deleted

## 2014-05-24 ENCOUNTER — Other Ambulatory Visit: Payer: Medicare Other

## 2014-05-24 ENCOUNTER — Ambulatory Visit: Payer: Medicare Other

## 2014-05-24 VITALS — BP 137/53 | HR 91 | Temp 98.1°F | Resp 18 | Ht 63.0 in | Wt 156.6 lb

## 2014-05-24 DIAGNOSIS — C3411 Malignant neoplasm of upper lobe, right bronchus or lung: Secondary | ICD-10-CM

## 2014-05-24 DIAGNOSIS — Z5111 Encounter for antineoplastic chemotherapy: Secondary | ICD-10-CM

## 2014-05-24 LAB — COMPREHENSIVE METABOLIC PANEL (CC13)
ALT: 14 U/L (ref 0–55)
ANION GAP: 13 meq/L — AB (ref 3–11)
AST: 22 U/L (ref 5–34)
Albumin: 3.4 g/dL — ABNORMAL LOW (ref 3.5–5.0)
Alkaline Phosphatase: 89 U/L (ref 40–150)
BUN: 9.9 mg/dL (ref 7.0–26.0)
CHLORIDE: 108 meq/L (ref 98–109)
CO2: 24 meq/L (ref 22–29)
Calcium: 9.9 mg/dL (ref 8.4–10.4)
Creatinine: 0.7 mg/dL (ref 0.6–1.1)
EGFR: >90 mL/min/{1.73_m2} (ref >90)
GLUCOSE: 113 mg/dL (ref 70–140)
POTASSIUM: 4.1 meq/L (ref 3.5–5.1)
SODIUM: 145 meq/L (ref 136–145)
Total Bilirubin: 0.33 mg/dL (ref 0.20–1.20)
Total Protein: 7.1 g/dL (ref 6.4–8.3)

## 2014-05-24 LAB — CBC WITH DIFFERENTIAL/PLATELET
BASO%: 0.7 % (ref 0.0–2.0)
Basophils Absolute: 0.1 10*3/uL (ref 0.0–0.1)
EOS ABS: 0.1 10*3/uL (ref 0.0–0.5)
EOS%: 0.7 % (ref 0.0–7.0)
HCT: 36.9 % (ref 34.8–46.6)
HGB: 12 g/dL (ref 11.6–15.9)
LYMPH%: 20.9 % (ref 14.0–49.7)
MCH: 29.1 pg (ref 25.1–34.0)
MCHC: 32.6 g/dL (ref 31.5–36.0)
MCV: 89.4 fL (ref 79.5–101.0)
MONO#: 1.1 10*3/uL — ABNORMAL HIGH (ref 0.1–0.9)
MONO%: 11.2 % (ref 0.0–14.0)
NEUT%: 66.5 % (ref 38.4–76.8)
NEUTROS ABS: 6.5 10*3/uL (ref 1.5–6.5)
Platelets: 480 10*3/uL — ABNORMAL HIGH (ref 145–400)
RBC: 4.13 10*6/uL (ref 3.70–5.45)
RDW: 16.3 % — AB (ref 11.2–14.5)
WBC: 9.8 10*3/uL (ref 3.9–10.3)
lymph#: 2 10*3/uL (ref 0.9–3.3)

## 2014-05-24 MED ORDER — CYANOCOBALAMIN 1000 MCG/ML IJ SOLN
1000.0000 ug | Freq: Once | INTRAMUSCULAR | Status: AC
  Administered 2014-05-24: 1000 ug via INTRAMUSCULAR

## 2014-05-24 MED ORDER — SODIUM CHLORIDE 0.9 % IV SOLN
Freq: Once | INTRAVENOUS | Status: AC
  Administered 2014-05-24: 11:00:00 via INTRAVENOUS

## 2014-05-24 MED ORDER — LIDOCAINE-PRILOCAINE 2.5-2.5 % EX CREA: TOPICAL_CREAM | CUTANEOUS | Status: DC

## 2014-05-24 MED ORDER — SODIUM CHLORIDE 0.9 % IV SOLN
Freq: Once | INTRAVENOUS | Status: AC
  Administered 2014-05-24: 11:00:00 via INTRAVENOUS
  Filled 2014-05-24: qty 4

## 2014-05-24 MED ORDER — SODIUM CHLORIDE 0.9 % IV SOLN
500.0000 mg/m2 | Freq: Once | INTRAVENOUS | Status: AC
  Administered 2014-05-24: 900 mg via INTRAVENOUS
  Filled 2014-05-24: qty 36

## 2014-05-24 MED ORDER — CYANOCOBALAMIN 1000 MCG/ML IJ SOLN
INTRAMUSCULAR | Status: AC
  Filled 2014-05-24: qty 1

## 2014-05-24 NOTE — Progress Notes (Signed)
[place] [phone]   [phone]  OFFICE PROGRESS NOTE  [name], MD [place] [address]  Diagnosis:  Metastatic non-small cell lung cancer initially diagnosed with locally advanced stage IIIB with right Pancoast tumor involving the vertebral body as well as the foraminal canal invasion with spinal cord compression in October of 2007.   Prior Therapy:  1. Status post concurrent chemoradiation with weekly carboplatin and paclitaxel, last dose was given November 18, 2005. 2. 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 3. Status post gamma knife radiotherapy to a solitary brain lesion located in the superior frontal area of the brain at [facility] in April of 2008. 4. Status post palliative radiotherapy to the lateral abdominal wall metastatic lesion under the care of Dr. Lisbeth Renshaw, completed March of 2009. 5. Status post 6 cycles of systemic chemotherapy with carboplatin and Alimta. Last dose was given July 26, 2007 with disease stabilization. 6. Gamma knife stereotactic radiotherapy to 2 brain lesions one involving the right frontal dural based as well as right parietal lesion performed on 05/07/2012 under the care of Dr. Vallarie Mare at [facility].  Current Therapy:  Maintenance chemotherapy with Alimta 500 mg per meter squared given every 3 weeks. The patient is status post 116 cycles.   CHEMOTHERAPY INTENT: Palliative/maintenance  CURRENT # OF CHEMOTHERAPY CYCLES: 117 CURRENT ANTIEMETICS: Zofran, dexamethasone and Compazine  CURRENT SMOKING STATUS: Quit smoking 10/07/2005  ORAL CHEMOTHERAPY AND CONSENT: None  CURRENT BISPHOSPHONATES USE: None  PAIN MANAGEMENT: 0/10 currently on morphine  NARCOTICS INDUCED CONSTIPATION: No constipation  LIVING WILL AND CODE STATUS: No CODE BLUE   INTERVAL HISTORY: PIERRA SKORA 64 y.o. female  returns to the clinic today for followup visit accompanied by her sister. She states that she had another MRI of the brain on 05/16/2014 by Dr. Vallarie Mare at [facility] .she states that he told her there was no evidence of any new brain metastasis and her recent areas of stroke were healing. 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 She is losing her words left often and is currently going to speech therapy twice a week. She states that her family members will will not leave her alone in the house anymore. She was to have been closely monitored for proximally 5-6 weeks. The patient is feeling fine today with no specific complaints. No significant fever or chills. She is tolerating her maintenance treatment with single agent Alimta fairly well with no significant adverse effects except for few episodes of nausea resolved with her antiemetics. The patient denied having any chest pain, shortness of breath, cough or hemoptysis. No nausea or vomiting. The patient denied having any weight loss or night sweats. She is here today to start cycle #117 her chemotherapy. She requests a refill prescription for Emla cream.   MEDICAL HISTORY: Past Medical History  Diagnosis Date  . Hypertension   . Anemia   . H/O: pneumonia   . History of tobacco abuse quit 10/08    on nicotine patch  . Fibromyalgia   . Hypokalemia   . DJD (degenerative joint disease), cervical   . Thrush 12/11/2010  . 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 had chemo and radiation for lung ca  . CVA (cerebral vascular accident) 04/2014    pt states had 2  cva's within 2 wks    ALLERGIES:  is allergic to contrast media; iohexol; sulfa drugs cross reactors; and co-trimoxazole injection.  MEDICATIONS:  Current Outpatient Prescriptions  Medication Sig Dispense Refill  . ALPRAZolam (XANAX) 0.25 MG tablet Take 1 tablet (0.25 mg total) by mouth at bedtime as needed  for sleep. 30 tablet 0  . amLODipine (NORVASC) 10 MG tablet TAKE 1 TABLET (10 MG TOTAL) BY MOUTH DAILY. 90 tablet 1  . aspirin 325 MG EC tablet Take 1 tablet (325 mg total) by mouth daily. 30 tablet 0  . atorvastatin (LIPITOR) 40 MG tablet Take 1 tablet (40 mg total) by mouth daily at 6 PM. 90 tablet 3  . fluticasone (FLONASE) 50 MCG/ACT nasal spray USE 1-2 SPRAYS IN EACH NOSTRIL DAILY (Patient taking differently: USE 1-2 SPRAYS IN EACH NOSTRIL DAILY PRN CONGESTION) 16 g 11  . folic acid (FOLVITE) 1 MG tablet TAKE 1 TABLET BY MOUTH DAILY. 90 tablet 1  . lacosamide (VIMPAT) 50 MG TABS tablet Take 1 tablet (50 mg total) by mouth 2 (two) times daily. 60 tablet 0  . lidocaine-prilocaine (EMLA) cream Apply to Port-A-Cath as directed 30 g 1  . lisinopril (PRINIVIL,ZESTRIL) 20 MG tablet TAKE 2 TABLETS (40 MG TOTAL) BY MOUTH DAILY. (Patient taking differently: TAKE 1 TABLET (20MG TOTAL) BY MOUTH DAILY AT BEDTIME) 60 tablet 5  . morphine (MS CONTIN) 60 MG 12 hr tablet Take 1 tablet (60 mg total) by mouth 2 (two) times daily. 60 tablet 0  . morphine (MSIR) 30 MG tablet Take 1 tablet (30 mg total) by mouth every 4 (four) hours as needed (breakthrough pain). 60 tablet 0  . olopatadine (PATANOL) 0.1 % ophthalmic solution Place 1 drop into both eyes 2 (two) times daily. 5 mL 11  . ondansetron (ZOFRAN) 8 MG tablet Take 1 tablet (8 mg total) by mouth 2 (two) times daily as needed for nausea or vomiting. 30 tablet 2  . predniSONE (DELTASONE) 50 MG tablet Take 50 mg by mouth See admin instructions. Take 1 tablet (50 mg) 13 hours before CT scan, 7 hours before CT scan and 1 hour before CT scan (every 9 weeks)     No current facility-administered medications for this visit.    SURGICAL HISTORY:  Past Surgical History  Procedure Laterality Date  . Cervical laminectomy  1995  . Other surgical history  2008    Gamma knife surgery to remove brain met   . Kyphosis surgery  7/08    because lung ca grew into spinal  canal  . Knee surgery  1990    Left x 2  . Portacath placement  -[name]    TIP IN CAVOATRIAL JUNCTION  . Loop recorder implant N/A 04/19/2014    Procedure: LOOP RECORDER IMPLANT;  Surgeon: Thompson Grayer, MD;  Location: [facility] CATH LAB;  Service: Cardiovascular;  Laterality: N/A;  . Tee without cardioversion N/A 04/19/2014    Procedure: TRANSESOPHAGEAL ECHOCARDIOGRAM (TEE);  Surgeon: Larey Dresser, MD;  Location: [place];  Service: Cardiovascular;  Laterality: N/A;    REVIEW OF SYSTEMS:  Constitutional: positive for fatigue Eyes: negative Ears, nose, mouth, throat, and face: negative Respiratory: negative Cardiovascular: negative Gastrointestinal: negative Genitourinary:negative Integument/breast: negative Hematologic/lymphatic: negative Musculoskeletal:negative Neurological: positive for memory problems, speech problems and recent CVAs Behavioral/Psych: negative Endocrine: negative Allergic/Immunologic: negative   PHYSICAL EXAMINATION: General appearance: alert, cooperative and no distress Head: Normocephalic, without obvious abnormality, atraumatic Neck: no adenopathy, no JVD, supple, symmetrical, trachea midline and thyroid not  enlarged, symmetric, no tenderness/mass/nodules Lymph nodes: Cervical, supraclavicular, and axillary nodes normal. Resp: clear to auscultation bilaterally Back: symmetric, no curvature. ROM normal. No CVA tenderness. Cardio: regular rate and rhythm, S1, S2 normal, no murmur, click, rub or gallop GI: soft, non-tender; bowel sounds normal; no masses,  no organomegaly Extremities: extremities normal, atraumatic, no cyanosis or edema Neurologic: Alert and oriented X 3, normal strength and tone. Normal symmetric reflexes. Normal coordination and gait  ECOG PERFORMANCE STATUS: 0 - Asymptomatic  Blood pressure 137/53, pulse 91, temperature 98.1 F (36.7 C), temperature source Oral, resp. rate 18, height '5\' 3"'  (1.6 m), weight 156 lb 9.6 oz (71.033  kg).  LABORATORY DATA: Lab Results  Component Value Date   WBC 9.8 05/24/2014   HGB 12.0 05/24/2014   HCT 36.9 05/24/2014   MCV 89.4 05/24/2014   PLT 480* 05/24/2014      Chemistry      Component Value Date/Time   NA 145 05/24/2014 0912   NA 135 04/26/2014 1205   K 4.1 05/24/2014 0912   K 4.2 04/26/2014 1205   CL 103 04/26/2014 1205   CL 106 06/30/2012 1004   CO2 24 05/24/2014 0912   CO2 29 04/26/2014 1205   BUN 9.9 05/24/2014 0912   BUN 17 04/26/2014 1205   CREATININE 0.7 05/24/2014 0912   CREATININE 0.67 04/26/2014 1205      Component Value Date/Time   CALCIUM 9.9 05/24/2014 0912   CALCIUM 9.5 04/26/2014 1205   ALKPHOS 89 05/24/2014 0912   ALKPHOS 57 04/26/2014 1205   AST 22 05/24/2014 0912   AST 15 04/26/2014 1205   ALT 14 05/24/2014 0912   ALT 10 04/26/2014 1205   BILITOT 0.33 05/24/2014 0912   BILITOT 0.4 04/26/2014 1205       RADIOGRAPHIC STUDIES: Ct Chest W Contrast  04/29/2014   CLINICAL DATA:  Patient with history of non-small cell lung carcinoma. Restaging exam.  EXAM: CT CHEST, ABDOMEN, AND PELVIS WITH CONTRAST  TECHNIQUE: Multidetector CT imaging of the chest, abdomen and pelvis was performed following the standard protocol during bolus administration of intravenous contrast.  CONTRAST:  173m OMNIPAQUE IOHEXOL 300 MG/ML  SOLN  COMPARISON:  CT CP 12/24/2013  FINDINGS: CT CHEST FINDINGS  Mediastinum/Nodes: Right anterior chest wall Port-A-Cath is present with tip terminating in the right atrium. No enlarged axillary, mediastinal or hilar lymphadenopathy. The heart is normal in size. No pericardial effusion. Coronary arterial vascular calcifications. Aorta and main pulmonary artery are normal in caliber.  Lungs/Pleura: The central airways are patent. Centrilobular emphysematous changes. Unchanged subpleural 9 mm opacity within the right upper lobe (image 16; series 5). No pleural effusion or pneumothorax. Minimal dependent atelectasis left lung base.   Musculoskeletal: No aggressive or acute appearing osseous lesions. Multiple thoracic vertebral bodies with kyphoplasty material.  CT ABDOMEN AND PELVIS FINDINGS  Hepatobiliary: Liver is normal in size and contour without focal hepatic lesion identified. No intrahepatic or extrahepatic biliary ductal dilatation. The gallbladder is unremarkable.  Pancreas: Unremarkable  Spleen: Unremarkable  Adrenals/Urinary Tract: Normal adrenal gland. Kidneys enhance symmetrically with contrast. No hydronephrosis. The urinary bladder is unremarkable.  Stomach/Bowel: Oral contrast material is present to the level of the rectum. The appendix is normal. No abnormal bowel wall thickening or evidence for bowel obstruction. No free fluid or free intraperitoneal air.  Vascular/Lymphatic: Normal caliber abdominal aorta. No retroperitoneal lymphadenopathy.  Other: The uterus and bilateral adnexal structures are unremarkable.  Musculoskeletal: Lower lumbar spine degenerative changes. No aggressive or acute appearing  osseous lesions. Unchanged compression deformity of the T3 vertebral body.  IMPRESSION: Stable exam without evidence for locally recurrent or metastatic disease within the chest, abdomen or pelvis.   Electronically Signed   By: Lovey Newcomer M.D.   On: 04/29/2014 08:05   Ct Abdomen Pelvis W Contrast  04/29/2014   CLINICAL DATA:  Patient with history of non-small cell lung carcinoma. Restaging exam.  EXAM: CT CHEST, ABDOMEN, AND PELVIS WITH CONTRAST  TECHNIQUE: Multidetector CT imaging of the chest, abdomen and pelvis was performed following the standard protocol during bolus administration of intravenous contrast.  CONTRAST:  174m OMNIPAQUE IOHEXOL 300 MG/ML  SOLN  COMPARISON:  CT CP 12/24/2013  FINDINGS: CT CHEST FINDINGS  Mediastinum/Nodes: Right anterior chest wall Port-A-Cath is present with tip terminating in the right atrium. No enlarged axillary, mediastinal or hilar lymphadenopathy. The heart is normal in size. No  pericardial effusion. Coronary arterial vascular calcifications. Aorta and main pulmonary artery are normal in caliber.  Lungs/Pleura: The central airways are patent. Centrilobular emphysematous changes. Unchanged subpleural 9 mm opacity within the right upper lobe (image 16; series 5). No pleural effusion or pneumothorax. Minimal dependent atelectasis left lung base.  Musculoskeletal: No aggressive or acute appearing osseous lesions. Multiple thoracic vertebral bodies with kyphoplasty material.  CT ABDOMEN AND PELVIS FINDINGS  Hepatobiliary: Liver is normal in size and contour without focal hepatic lesion identified. No intrahepatic or extrahepatic biliary ductal dilatation. The gallbladder is unremarkable.  Pancreas: Unremarkable  Spleen: Unremarkable  Adrenals/Urinary Tract: Normal adrenal gland. Kidneys enhance symmetrically with contrast. No hydronephrosis. The urinary bladder is unremarkable.  Stomach/Bowel: Oral contrast material is present to the level of the rectum. The appendix is normal. No abnormal bowel wall thickening or evidence for bowel obstruction. No free fluid or free intraperitoneal air.  Vascular/Lymphatic: Normal caliber abdominal aorta. No retroperitoneal lymphadenopathy.  Other: The uterus and bilateral adnexal structures are unremarkable.  Musculoskeletal: Lower lumbar spine degenerative changes. No aggressive or acute appearing osseous lesions. Unchanged compression deformity of the T3 vertebral body.  IMPRESSION: Stable exam without evidence for locally recurrent or metastatic disease within the chest, abdomen or pelvis.   Electronically Signed   By: DLovey NewcomerM.D.   On: 04/29/2014 08:05     ASSESSMENT AND PLAN: This is a very pleasant 64years old white female with metastatic non-small cell lung cancer currently undergoing maintenance chemotherapy with single agent Alimta status post 115 cycles. The patient is tolerating her treatment fairly well with no significant adverse  effects. Per the patient and her sister, she was recently [place] that did not reveal any new brain metastasis and showed healing of her recent CVAs She will receive cycle #117 today. Her recent restaging CT scan revealed stable disease without evidence for locally recurrent or metastatic disease. She is advised to continue on Vimpat and follow up with Neurology as scheduled. She is also to follow up with cardiology regarding her heart rate monitor.She will follow in 3 weeks prior to cycle #118. A refill prescription for her M1 cream was sent to her pharmacy of record via E scribed. For pain management, she will continue on her current pain medication with MS Contin and MS IR.  She was advised to call immediately if she has any concerning symptoms in the interval. The patient voices understanding of current disease status and treatment options and is in agreement with the current care plan.  All questions  were answered. The patient knows to call the clinic with any problems, questions or concerns. We can certainly see the patient much sooner if necessary.  Carlton Adam, PA-C 05/24/2014

## 2014-05-24 NOTE — Telephone Encounter (Signed)
Gave and printed appt sched and avs fo rpt for May and JUNE

## 2014-05-24 NOTE — Patient Instructions (Signed)
Follow up in 3 weeks, prior to your next scheduled cycle of maintenance chemotherapy

## 2014-05-24 NOTE — Patient Instructions (Signed)
[facility] Discharge Instructions for Patients Receiving Chemotherapy  Today you received the following chemotherapy agents Alimta  To help prevent nausea and vomiting after your treatment, we encourage you to take your nausea medication   If you develop nausea and vomiting that is not controlled by your nausea medication, call the clinic.   BELOW ARE SYMPTOMS THAT SHOULD BE REPORTED IMMEDIATELY:  *FEVER GREATER THAN 100.5 F  *CHILLS WITH OR WITHOUT FEVER  NAUSEA AND VOMITING THAT IS NOT CONTROLLED WITH YOUR NAUSEA MEDICATION  *UNUSUAL SHORTNESS OF BREATH  *UNUSUAL BRUISING OR BLEEDING  TENDERNESS IN MOUTH AND THROAT WITH OR WITHOUT PRESENCE OF ULCERS  *URINARY PROBLEMS  *BOWEL PROBLEMS  UNUSUAL RASH Items with * indicate a potential emergency and should be followed up as soon as possible.  Feel free to call the clinic you have any questions or concerns. The clinic phone number is [phone].  Please show the CHEMO ALERT CARD at check-in to the Emergency Department and triage nurse.   

## 2014-05-24 NOTE — Telephone Encounter (Signed)
Gave and printed appt sched anda vs fo rpt for May and JUNE

## 2014-05-26 ENCOUNTER — Ambulatory Visit: Payer: Medicare Other | Admitting: Speech Pathology

## 2014-05-27 ENCOUNTER — Ambulatory Visit: Payer: Medicare Other

## 2014-05-31 ENCOUNTER — Ambulatory Visit: Payer: Medicare Other | Admitting: Speech Pathology

## 2014-05-31 DIAGNOSIS — R279 Unspecified lack of coordination: Secondary | ICD-10-CM | POA: Diagnosis not present

## 2014-05-31 DIAGNOSIS — I6980 Unspecified sequelae of other cerebrovascular disease: Secondary | ICD-10-CM | POA: Diagnosis not present

## 2014-05-31 DIAGNOSIS — R4701 Aphasia: Secondary | ICD-10-CM

## 2014-05-31 DIAGNOSIS — R29898 Other symptoms and signs involving the musculoskeletal system: Secondary | ICD-10-CM | POA: Diagnosis not present

## 2014-05-31 NOTE — Therapy (Signed)
[facility] [address] [place], [place], [address] Phone: [phone]   Fax:  [phone]  Speech Language Pathology Treatment  Patient Details  Name: Danielle [name] MRN: [id number] Date of Birth: 10-Aug-1950 Referring Provider:  [name], MD  Encounter Date: 05/31/2014      End of Session - 05/31/14 0933    Visit Number 7   Number of Visits 17   Date for SLP Re-Evaluation 07/04/14   SLP Start Time 0850   SLP Stop Time  0930   SLP Time Calculation (min) 40 min   Activity Tolerance Patient tolerated treatment well      Past Medical History  Diagnosis Date  . Hypertension   . Anemia   . H/O: pneumonia   . History of tobacco abuse quit 10/08    on nicotine patch  . Fibromyalgia   . Hypokalemia   . DJD (degenerative joint disease), cervical   . Thrush 12/11/2010  . 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 had chemo and radiation for lung ca  . CVA (cerebral vascular accident) 04/2014    pt states had 2 cva's within 2 wks    Past Surgical History  Procedure Laterality Date  . Cervical laminectomy  1995  . Other surgical history  2008    Gamma knife surgery to remove brain met   . 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 Knee surgery  1990    Left x 2  . Portacath placement  -[name]    TIP IN CAVOATRIAL JUNCTION  . Loop recorder implant N/A 04/19/2014    Procedure: LOOP RECORDER IMPLANT;  Surgeon: Thompson Grayer, MD;  Location: [facility] CATH LAB;  Service: Cardiovascular;  Laterality: N/A;  . Tee without cardioversion N/A 04/19/2014    Procedure: TRANSESOPHAGEAL ECHOCARDIOGRAM (TEE);  Surgeon: Larey Dresser, MD;  Location: [place];  Service: Cardiovascular;  Laterality: N/A;    There were no vitals filed for this visit.  Visit Diagnosis: Aphasia      Subjective Assessment - 05/31/14 0855    Subjective "I'm remembering phone numbers  now"   Patient is accompained by: Family member               ADULT SLP TREATMENT - 05/31/14 0855    General Information   Behavior/Cognition Alert;Cooperative;Pleasant mood   Treatment Provided   Treatment provided Cognitive-Linquistic   Pain Assessment   Pain Assessment 0-10   Pain Score 3    Pain Location back   Pain Descriptors / Indicators Aching   Pain Intervention(s) Monitored during session   Cognitive-Linquistic Treatment   Treatment focused on Aphasia   Skilled Treatment Pt required occassional mod A to Id and correct errors on homework - 85% correct. Generate sentences with multiple meaning words (simple)  with  usual min to mod A to generate 2nd meaning sentence.  Name 5-7 items in a given low frequency category  - pt named 4-5 consitently, 5-7 with usual min A.   Assessment / Recommendations / Plan   Plan Continue with current plan of care   Progression Toward Goals   Progression toward goals Progressing toward goals            SLP Short Term Goals - 05/31/14 0931    SLP SHORT TERM GOAL #1   Title pt engage in confrontation naming (non-common items) 85% success  Time 1   Period Weeks   Status On-going   SLP SHORT TERM GOAL #2   Title pt generate simple sentence responses with 80% success and rare min A   Time 1   Period Weeks   Status On-going   SLP SHORT TERM GOAL #3   Title pt participate in functional simple conversation (using verbal expression strategeies) for 8 minutes with rare min A    Time 1   Period Weeks   Status On-going   SLP SHORT TERM GOAL #4   Title pt will demo error awareness in conversation 90% of the time   Time 1   Period Weeks   Status On-going          SLP Long Term Goals - 05/31/14 0932    SLP LONG TERM GOAL #1   Title pt will participate in 8 minutes functional mod complex conversation with modified independence (compensations)   Time 5   Period Weeks   Status On-going   SLP LONG TERM GOAL #2   Title pt will  use compensations for expressive aphasia in mod complex/complex conversation with rare min a to do so   Time 5   Period Weeks   Status On-going   SLP LONG TERM GOAL #3   Title pt will demo understanding of mod complex conversation with rare repeats necessary   Time 5   Period Weeks   Status On-going          Plan - 05/31/14 7741    Clinical Impression Statement Pt requires min to mod A for low frequency naming, word finding. She is utlizing compensations duirng strcutured language tasks with min A. Continue skilled ST to maximize verbal expression at conversation level.   Speech Therapy Frequency 2x / week   Treatment/Interventions Language facilitation;Cueing hierarchy;Functional tasks;Patient/family education;Compensatory strategies;SLP instruction and feedback   Potential to Achieve Goals Good   Consulted and Agree with Plan of Care Patient        Problem List Patient Active Problem List   Diagnosis Date Noted  . Brain lesion   . Expressive aphasia   . Cerebral infarction due to embolism of cerebral artery   . HLD (hyperlipidemia)   . Acute encephalopathy 04/17/2014  . Acute CVA (cerebrovascular accident) 04/17/2014  . AKI (acute kidney injury) 04/16/2014  . Cancer-related pain 12/28/2013  . Atherosclerosis of aorta 08/13/2013  . Cancer of upper lobe of right lung 11/19/2010  . BENIGN POSITIONAL VERTIGO 06/13/2009  . CONSTIPATION 04/07/2009  . Diabetes mellitus 09/29/2008  . HYPERCHOLESTEROLEMIA 09/29/2008  . INSOMNIA 02/22/2008  . ALLERGIC CONJUNCTIVITIS 01/20/2008  . PULMONARY NOCARDIOSIS 01/07/2008  . Essential hypertension 01/07/2008  . ANEMIA-NOS 03/31/2006  . LUNG CANCER, HX OF 03/31/2006  . PNEUMONIA, HX OF 03/31/2006  . LAMINECTOMY, HX OF 03/31/2006    LovvornLorenda Peck, CCC-SLP 05/31/2014, 9:33 AM  [facility] [address] [place], [place], [address] Phone: [phone]   Fax:   [phone]

## 2014-06-02 ENCOUNTER — Ambulatory Visit: Payer: Medicare Other

## 2014-06-02 DIAGNOSIS — I6980 Unspecified sequelae of other cerebrovascular disease: Secondary | ICD-10-CM | POA: Diagnosis not present

## 2014-06-02 DIAGNOSIS — R279 Unspecified lack of coordination: Secondary | ICD-10-CM | POA: Diagnosis not present

## 2014-06-02 DIAGNOSIS — R4701 Aphasia: Secondary | ICD-10-CM | POA: Diagnosis not present

## 2014-06-02 DIAGNOSIS — R29898 Other symptoms and signs involving the musculoskeletal system: Secondary | ICD-10-CM | POA: Diagnosis not present

## 2014-06-02 NOTE — Therapy (Signed)
[facility] [address] [place], [place], [address] Phone: [phone]   Fax:  [phone]  Speech Language Pathology Treatment  Patient Details  Name: Danielle [name] MRN: [id number] Date of Birth: 09/19/1950 Referring Provider:  [name], MD  Encounter Date: 06/02/2014      End of Session - 06/02/14 0921    Visit Number 8   Number of Visits 17   Date for SLP Re-Evaluation 07/04/14   SLP Start Time 0848   SLP Stop Time  0930   SLP Time Calculation (min) 42 min   Activity Tolerance Patient tolerated treatment well      Past Medical History  Diagnosis Date  . Hypertension   . Anemia   . H/O: pneumonia   . History of tobacco abuse quit 10/08    on nicotine patch  . Fibromyalgia   . Hypokalemia   . DJD (degenerative joint disease), cervical   . Thrush 12/11/2010  . 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 had chemo and radiation for lung ca  . CVA (cerebral vascular accident) 04/2014    pt states had 2 cva's within 2 wks    Past Surgical History  Procedure Laterality Date  . Cervical laminectomy  1995  . Other surgical history  2008    Gamma knife surgery to remove brain met   . 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 Knee surgery  1990    Left x 2  . Portacath placement  -[name]    TIP IN CAVOATRIAL JUNCTION  . Loop recorder implant N/A 04/19/2014    Procedure: LOOP RECORDER IMPLANT;  Surgeon: Thompson Grayer, MD;  Location: [facility] CATH LAB;  Service: Cardiovascular;  Laterality: N/A;  . Tee without cardioversion N/A 04/19/2014    Procedure: TRANSESOPHAGEAL ECHOCARDIOGRAM (TEE);  Surgeon: Larey Dresser, MD;  Location: [facility];  Service: Cardiovascular;  Laterality: N/A;    There were no vitals filed for this visit.  Visit Diagnosis: Aphasia      Subjective Assessment - 06/02/14 0903    Subjective Pt arrives today without her  daughter in law               ADULT SLP TREATMENT - 06/02/14 0905    General Information   Behavior/Cognition Alert;Cooperative;Pleasant mood   Treatment Provided   Treatment provided Cognitive-Linquistic   Pain Assessment   Pain Assessment 0-10   Pain Score 6    Pain Location back   Pain Descriptors / Indicators Aching   Pain Intervention(s) Monitored during session   Cognitive-Linquistic Treatment   Treatment focused on Aphasia   Skilled Treatment In multiple meaning words, completed with occasional verbal and semantic cues.  In mod complex conversation pt with rare semantic cues needed.   Assessment / Recommendations / Plan   Plan Continue with current plan of care   Progression Toward Goals   Progression toward goals Progressing toward goals            SLP Short Term Goals - 06/02/14 0926    SLP SHORT TERM GOAL #1   Title pt engage in confrontation naming (non-common items) 85% success   Time 1   Period Weeks   Status Achieved   SLP SHORT TERM GOAL #2   Title pt generate simple sentence responses with 80% success and rare min A   Time 1   Period  Weeks   Status Achieved   SLP SHORT TERM GOAL #3   Title pt participate in functional simple conversation (using verbal expression strategeies) for 8 minutes with rare min A    Time 1   Period Weeks   Status Achieved   SLP SHORT TERM GOAL #4   Title pt will demo error awareness in conversation 90% of the time   Time 1   Period Weeks   Status On-going          SLP Long Term Goals - 06/02/14 0930    SLP LONG TERM GOAL #1   Title pt will participate in 8 minutes functional mod complex conversation with modified independence (compensations) over two sessions   Time 5   Period Weeks   Status Revised   SLP LONG TERM GOAL #2   Title pt will use compensations for expressive aphasia in mod complex/complex conversation with rare min a to do so   Time 5   Period Weeks   Status On-going   SLP LONG TERM GOAL #3    Title pt will demo understanding of mod complex conversation with rare repeats necessary   Time 5   Period Weeks   Status Achieved          Plan - 06/02/14 5277    Clinical Impression Statement Mod complex conversation completed today much better success than formal structured speech tasks. Continue skilled ST to maximize verbal expression at conversational levels.        Problem List Patient Active Problem List   Diagnosis Date Noted  . Brain lesion   . Expressive aphasia   . Cerebral infarction due to embolism of cerebral artery   . HLD (hyperlipidemia)   . Acute encephalopathy 04/17/2014  . Acute CVA (cerebrovascular accident) 04/17/2014  . AKI (acute kidney injury) 04/16/2014  . Cancer-related pain 12/28/2013  . Atherosclerosis of aorta 08/13/2013  . Cancer of upper lobe of right lung 11/19/2010  . BENIGN POSITIONAL VERTIGO 06/13/2009  . CONSTIPATION 04/07/2009  . Diabetes mellitus 09/29/2008  . HYPERCHOLESTEROLEMIA 09/29/2008  . INSOMNIA 02/22/2008  . ALLERGIC CONJUNCTIVITIS 01/20/2008  . PULMONARY NOCARDIOSIS 01/07/2008  . Essential hypertension 01/07/2008  . ANEMIA-NOS 03/31/2006  . LUNG CANCER, HX OF 03/31/2006  . PNEUMONIA, HX OF 03/31/2006  . Lenn Cal OF 03/31/2006    [facility] , MS, CCC-SLP  06/02/2014, 9:33 AM  [facility] [address] [place], [place], [address] Phone: [phone]   Fax:  [phone]

## 2014-06-07 ENCOUNTER — Ambulatory Visit: Payer: Medicare Other

## 2014-06-07 DIAGNOSIS — R29898 Other symptoms and signs involving the musculoskeletal system: Secondary | ICD-10-CM | POA: Diagnosis not present

## 2014-06-07 DIAGNOSIS — R4701 Aphasia: Secondary | ICD-10-CM

## 2014-06-07 DIAGNOSIS — R279 Unspecified lack of coordination: Secondary | ICD-10-CM | POA: Diagnosis not present

## 2014-06-07 DIAGNOSIS — I6980 Unspecified sequelae of other cerebrovascular disease: Secondary | ICD-10-CM | POA: Diagnosis not present

## 2014-06-07 NOTE — Therapy (Signed)
[facility] [address] [place], [place], [address] Phone: [phone]   Fax:  [phone]  Speech Language Pathology Treatment  Patient Details  Name: Danielle [name] MRN: [id number] Date of Birth: [birth year] Referring Provider:  [name], MD  Encounter Date: 06/07/2014      End of Session - 06/07/14 0846    Visit Number 9   Number of Visits 17   Date for SLP Re-Evaluation 07/04/14   SLP Start Time 0804   SLP Stop Time  0847   SLP Time Calculation (min) 43 min   Activity Tolerance Patient tolerated treatment well      Past Medical History  Diagnosis Date  . Hypertension   . Anemia   . H/O: pneumonia   . History of tobacco abuse quit 10/08    on nicotine patch  . Fibromyalgia   . Hypokalemia   . DJD (degenerative joint disease), cervical   . Thrush 12/11/2010  . 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 had chemo and radiation for lung ca  . CVA (cerebral vascular accident) 04/2014    pt states had 2 cva's within 2 wks    Past Surgical History  Procedure Laterality Date  . Cervical laminectomy  1995  . Other surgical history  2008    Gamma knife surgery to remove brain met   . 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 Knee surgery  1990    Left x 2  . Portacath placement  -[name]    TIP IN CAVOATRIAL JUNCTION  . Loop recorder implant N/A 04/19/2014    Procedure: LOOP RECORDER IMPLANT;  Surgeon: Thompson Grayer, MD;  Location: [facility] CATH LAB;  Service: Cardiovascular;  Laterality: N/A;  . Tee without cardioversion N/A 04/19/2014    Procedure: TRANSESOPHAGEAL ECHOCARDIOGRAM (TEE);  Surgeon: Larey Dresser, MD;  Location: [facility];  Service: Cardiovascular;  Laterality: N/A;    There were no vitals filed for this visit.  Visit Diagnosis: Aphasia      Subjective Assessment - 06/07/14 0838    Subjective "It's going pretty good!" Pt  related to SLP she does not write and would like to focus on speaking. Will talk with family about whether or not to continue after this week.               ADULT SLP TREATMENT - 06/07/14 0812    General Information   Behavior/Cognition Alert;Cooperative;Pleasant mood   Treatment Provided   Treatment provided Cognitive-Linquistic   Pain Assessment   Pain Assessment 0-10   Pain Score 0-No pain   Pain Intervention(s) Premedicated before session   Cognitive-Linquistic Treatment   Treatment focused on Aphasia   Skilled Treatment In simple conversation before formal speech tasks pt with WNL language. In description tasks pt exhibited good success, with two of five errors self corrected. After formal tasks, SLP engaged pt in mod complex conversation - WNL language.   Assessment / Recommendations / Plan   Plan Continue with current plan of care   Progression Toward Goals   Progression toward goals Progressing toward goals            SLP Short Term Goals - 06/07/14 0849    SLP SHORT TERM GOAL #1   Title pt engage in confrontation naming (non-common items) 85% success   Time 1   Period Weeks   Status Achieved  SLP SHORT TERM GOAL #2   Title pt generate simple sentence responses with 80% success and rare min A   Time 1   Period Weeks   Status Achieved   SLP SHORT TERM GOAL #3   Title pt participate in functional simple conversation (using verbal expression strategeies) for 8 minutes with rare min A    Time 1   Period Weeks   Status Achieved   SLP SHORT TERM GOAL #4   Title pt will demo error awareness in conversation 90% of the time   Time 1   Period Weeks   Status On-going          SLP Long Term Goals - 06/07/14 9201    SLP LONG TERM GOAL #1   Title pt will participate in 8 minutes functional mod complex conversation with modified independence (compensations) over two sessions   Time 5   Period Weeks   Status Revised   SLP LONG TERM GOAL #2   Title pt will  use compensations for expressive aphasia in mod complex/complex conversation with rare min a to do so   Time 5   Period Weeks   Status On-going   SLP LONG TERM GOAL #3   Title pt will demo understanding of mod complex conversation with rare repeats necessary   Time 5   Period Weeks   Status Achieved          Plan - 06/07/14 0847    Clinical Impression Statement Pt with vastly improved conversational speech at simple and mod complex levels. Cont skilled ST to monitor for consistency in these levels.   Speech Therapy Frequency 2x / week   Duration --  5 weeks   Treatment/Interventions Language facilitation;Cueing hierarchy;Functional tasks;Patient/family education;Compensatory strategies;SLP instruction and feedback   Potential to Achieve Goals Good   Potential Considerations Severity of impairments        Problem List Patient Active Problem List   Diagnosis Date Noted  . Brain lesion   . Expressive aphasia   . Cerebral infarction due to embolism of cerebral artery   . HLD (hyperlipidemia)   . Acute encephalopathy 04/17/2014  . Acute CVA (cerebrovascular accident) 04/17/2014  . AKI (acute kidney injury) 04/16/2014  . Cancer-related pain 12/28/2013  . Atherosclerosis of aorta 08/13/2013  . Cancer of upper lobe of right lung 11/19/2010  . BENIGN POSITIONAL VERTIGO 06/13/2009  . CONSTIPATION 04/07/2009  . Diabetes mellitus 09/29/2008  . HYPERCHOLESTEROLEMIA 09/29/2008  . INSOMNIA 02/22/2008  . ALLERGIC CONJUNCTIVITIS 01/20/2008  . PULMONARY NOCARDIOSIS 01/07/2008  . Essential hypertension 01/07/2008  . ANEMIA-NOS 03/31/2006  . LUNG CANCER, HX OF 03/31/2006  . PNEUMONIA, HX OF 03/31/2006  . Lenn Cal OF 03/31/2006    [facility], SLP 06/07/2014, 8:50 AM  [facility] [address] [place], [place], [address] Phone: [phone]   Fax:  [phone]

## 2014-06-08 LAB — CUP PACEART REMOTE DEVICE CHECK
Date Time Interrogation Session: 20160509113958
Zone Setting Detection Interval: 2000 ms
Zone Setting Detection Interval: 3000 ms
Zone Setting Detection Interval: 330 ms

## 2014-06-09 ENCOUNTER — Encounter: Payer: Self-pay | Admitting: *Deleted

## 2014-06-09 ENCOUNTER — Ambulatory Visit: Payer: Medicare Other | Attending: Neurology

## 2014-06-09 DIAGNOSIS — R4701 Aphasia: Secondary | ICD-10-CM | POA: Insufficient documentation

## 2014-06-09 NOTE — Therapy (Signed)
[facility] [address] [place], [place], [address] Phone: [phone]   Fax:  [phone]  Speech Language Pathology Treatment  Patient Details  Name: Danielle [name] MRN: [id number] Date of Birth: 1950/02/11 Referring Provider:  [name], MD  Encounter Date: 06/09/2014      End of Session - 06/09/14 1009    Visit Number 10   Number of Visits 17   Date for SLP Re-Evaluation 07/04/14   SLP Start Time 0936   SLP Stop Time  1007   SLP Time Calculation (min) 31 min   Activity Tolerance Patient tolerated treatment well      Past Medical History  Diagnosis Date  . Hypertension   . Anemia   . H/O: pneumonia   . History of tobacco abuse quit 10/08    on nicotine patch  . Fibromyalgia   . Hypokalemia   . DJD (degenerative joint disease), cervical   . Thrush 12/11/2010  . 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 had chemo and radiation for lung ca  . CVA (cerebral vascular accident) 04/2014    pt states had 2 cva's within 2 wks    Past Surgical History  Procedure Laterality Date  . Cervical laminectomy  1995  . Other surgical history  2008    Gamma knife surgery to remove brain met   . 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 Knee surgery  1990    Left x 2  . Portacath placement  -[name]    TIP IN CAVOATRIAL JUNCTION  . Loop recorder implant N/A 04/19/2014    Procedure: LOOP RECORDER IMPLANT;  Surgeon: Thompson Grayer, MD;  Location: [facility] CATH LAB;  Service: Cardiovascular;  Laterality: N/A;  . Tee without cardioversion N/A 04/19/2014    Procedure: TRANSESOPHAGEAL ECHOCARDIOGRAM (TEE);  Surgeon: Larey Dresser, MD;  Location: [facility];  Service: Cardiovascular;  Laterality: N/A;    There were no vitals filed for this visit.  Visit Diagnosis: Aphasia             ADULT SLP TREATMENT - 06/09/14 0001    General Information   Behavior/Cognition Alert;Cooperative;Pleasant mood   Treatment Provided   Treatment provided Cognitive-Linquistic   Pain Assessment   Pain Assessment 0-10   Pain Score 3    Pain Location back   Pain Descriptors / Indicators Aching   Pain Intervention(s) Premedicated before session   Cognitive-Linquistic Treatment   Treatment focused on Aphasia   Skilled Treatment In 30 minutes of conversation (mod complex) pt generated no dysphasia/anomia. Her comprehension was WNL, as she answered all questions and responded to all comments appropriately.    Assessment / Recommendations / Plan   Plan Goals updated;All goals met  One to two more sessions to ensure consistency, then d/c.   Progression Toward Goals   Progression toward goals Progressing toward goals            SLP Short Term Goals - 06/09/14 0950    SLP SHORT TERM GOAL #1   Title pt engage in confrontation naming (non-common items) 85% success   Time 1   Period Weeks   Status Achieved   SLP SHORT TERM GOAL #2   Title pt generate simple sentence responses with 80% success and rare min A   Time 1   Period Weeks   Status Achieved   SLP SHORT TERM GOAL #  3   Title pt participate in functional simple conversation (using verbal expression strategeies) for 8 minutes with rare min A    Time 1   Period Weeks   Status Achieved   SLP SHORT TERM GOAL #4   Title pt will demo error awareness in conversation 90% of the time   Status Achieved          SLP Long Term Goals - 06-20-2014 0950    SLP LONG TERM GOAL #1   Title pt will participate in 8 minutes functional mod complex conversation with modified independence (compensations) over two sessions   Status Achieved   SLP LONG TERM GOAL #2   Title pt will use compensations for expressive aphasia in mod complex/complex conversation with rare min a to do so   Status Achieved   SLP LONG TERM GOAL #3   Title pt will demo understanding of mod complex conversation with rare repeats  necessary   Status Achieved   SLP LONG TERM GOAL #4   Title pt to demo copmensations for verbal expression over 15 minute conversation independently   Time 2   Period --  visits   Status New          Plan - 06/20/14 1009    Clinical Impression Statement Pt will be discharged in one-two visits, if cont progress persists. Frequency reduced to x1/week.   Speech Therapy Frequency 1x /week   Duration --  5 weeks   Treatment/Interventions Language facilitation;Cueing hierarchy;Functional tasks;Patient/family education;Compensatory strategies;SLP instruction and feedback   Potential to Achieve Goals Good          G-Codes - 20-Jun-2014 1017    Functional Limitations Spoken language expressive   Spoken Language Expression Current Status [phone]) At least 1 percent but less than 20 percent impaired, limited or restricted   Spoken Language Expression Goal Status ([id number]) At least 1 percent but less than 20 percent impaired, limited or restricted      Problem List Patient Active Problem List   Diagnosis Date Noted  . Brain lesion   . Expressive aphasia   . Cerebral infarction due to embolism of cerebral artery   . HLD (hyperlipidemia)   . Acute encephalopathy 04/17/2014  . Acute CVA (cerebrovascular accident) 04/17/2014  . AKI (acute kidney injury) 04/16/2014  . H/O therapeutic radiation 01/12/2014  . Cancer-related pain 12/28/2013  . Atherosclerosis of aorta 08/13/2013  . Brain metastases 10/28/2011  . Cancer of upper lobe of right lung 11/19/2010  . H/O neoplasm 10/22/2010  . Lung cancer, primary, with metastasis from lung to other site 10/22/2010  . BENIGN POSITIONAL VERTIGO 06/13/2009  . CONSTIPATION 04/07/2009  . Diabetes mellitus 09/29/2008  . HYPERCHOLESTEROLEMIA 09/29/2008  . INSOMNIA 02/22/2008  . ALLERGIC CONJUNCTIVITIS 01/20/2008  . PULMONARY NOCARDIOSIS 01/07/2008  . Essential hypertension 01/07/2008  . ANEMIA-NOS 03/31/2006  . LUNG CANCER, HX OF 03/31/2006  .  PNEUMONIA, HX OF 03/31/2006  . Lenn Cal OF 03/31/2006    [facility] , MS, CCC-SLP  20-Jun-2014, 10:18 AM  [facility] [address] [place], [place], [address] Phone: [phone]   Fax:  [phone]

## 2014-06-14 ENCOUNTER — Ambulatory Visit (HOSPITAL_BASED_OUTPATIENT_CLINIC_OR_DEPARTMENT_OTHER): Payer: Medicare Other | Admitting: Internal Medicine

## 2014-06-14 ENCOUNTER — Encounter: Payer: Self-pay | Admitting: Internal Medicine

## 2014-06-14 ENCOUNTER — Other Ambulatory Visit (HOSPITAL_BASED_OUTPATIENT_CLINIC_OR_DEPARTMENT_OTHER): Payer: Medicare Other

## 2014-06-14 ENCOUNTER — Other Ambulatory Visit: Payer: Medicare Other

## 2014-06-14 ENCOUNTER — Ambulatory Visit (HOSPITAL_BASED_OUTPATIENT_CLINIC_OR_DEPARTMENT_OTHER): Payer: Medicare Other

## 2014-06-14 ENCOUNTER — Telehealth: Payer: Self-pay | Admitting: Internal Medicine

## 2014-06-14 VITALS — BP 152/75 | HR 105 | Temp 98.1°F | Resp 18 | Ht 63.0 in | Wt 154.4 lb

## 2014-06-14 DIAGNOSIS — C3411 Malignant neoplasm of upper lobe, right bronchus or lung: Secondary | ICD-10-CM

## 2014-06-14 DIAGNOSIS — C3410 Malignant neoplasm of upper lobe, unspecified bronchus or lung: Secondary | ICD-10-CM

## 2014-06-14 DIAGNOSIS — C7931 Secondary malignant neoplasm of brain: Secondary | ICD-10-CM | POA: Diagnosis not present

## 2014-06-14 DIAGNOSIS — Z5111 Encounter for antineoplastic chemotherapy: Secondary | ICD-10-CM | POA: Insufficient documentation

## 2014-06-14 DIAGNOSIS — Z006 Encounter for examination for normal comparison and control in clinical research program: Secondary | ICD-10-CM

## 2014-06-14 DIAGNOSIS — C3491 Malignant neoplasm of unspecified part of right bronchus or lung: Secondary | ICD-10-CM

## 2014-06-14 LAB — COMPREHENSIVE METABOLIC PANEL (CC13)
ALT: 8 U/L (ref 0–55)
AST: 18 U/L (ref 5–34)
Albumin: 3.4 g/dL — ABNORMAL LOW (ref 3.5–5.0)
Alkaline Phosphatase: 96 U/L (ref 40–150)
Anion Gap: 10 mEq/L (ref 3–11)
BUN: 9.9 mg/dL (ref 7.0–26.0)
CHLORIDE: 108 meq/L (ref 98–109)
CO2: 24 mEq/L (ref 22–29)
Calcium: 9.6 mg/dL (ref 8.4–10.4)
Creatinine: 0.8 mg/dL (ref 0.6–1.1)
EGFR: 82 mL/min/{1.73_m2} — ABNORMAL LOW (ref >90)
GLUCOSE: 131 mg/dL (ref 70–140)
Potassium: 3.8 mEq/L (ref 3.5–5.1)
SODIUM: 143 meq/L (ref 136–145)
TOTAL PROTEIN: 7.1 g/dL (ref 6.4–8.3)
Total Bilirubin: 0.5 mg/dL (ref 0.20–1.20)

## 2014-06-14 LAB — CBC WITH DIFFERENTIAL/PLATELET
BASO%: 0.2 % (ref 0.0–2.0)
Basophils Absolute: 0 10*3/uL (ref 0.0–0.1)
EOS%: 0.7 % (ref 0.0–7.0)
Eosinophils Absolute: 0.1 10*3/uL (ref 0.0–0.5)
HEMATOCRIT: 38.1 % (ref 34.8–46.6)
HEMOGLOBIN: 12.6 g/dL (ref 11.6–15.9)
LYMPH#: 1.8 10*3/uL (ref 0.9–3.3)
LYMPH%: 17.5 % (ref 14.0–49.7)
MCH: 29.9 pg (ref 25.1–34.0)
MCHC: 33.1 g/dL (ref 31.5–36.0)
MCV: 90.3 fL (ref 79.5–101.0)
MONO#: 1.1 10*3/uL — AB (ref 0.1–0.9)
MONO%: 10.7 % (ref 0.0–14.0)
NEUT%: 70.9 % (ref 38.4–76.8)
NEUTROS ABS: 7.1 10*3/uL — AB (ref 1.5–6.5)
Platelets: 391 10*3/uL (ref 145–400)
RBC: 4.22 10*6/uL (ref 3.70–5.45)
RDW: 14.8 % — ABNORMAL HIGH (ref 11.2–14.5)
WBC: 10 10*3/uL (ref 3.9–10.3)

## 2014-06-14 LAB — RESEARCH LABS

## 2014-06-14 MED ORDER — MORPHINE SULFATE 30 MG PO TABS
30.0000 mg | ORAL_TABLET | ORAL | Status: DC | PRN
Start: 2014-06-14 — End: 2014-07-05

## 2014-06-14 MED ORDER — PEMETREXED DISODIUM CHEMO INJECTION 500 MG
500.0000 mg/m2 | Freq: Once | INTRAVENOUS | Status: AC
  Administered 2014-06-14: 900 mg via INTRAVENOUS
  Filled 2014-06-14: qty 36

## 2014-06-14 MED ORDER — SODIUM CHLORIDE 0.9 % IV SOLN
Freq: Once | INTRAVENOUS | Status: AC
  Administered 2014-06-14: 10:00:00 via INTRAVENOUS

## 2014-06-14 MED ORDER — SODIUM CHLORIDE 0.9 % IV SOLN
Freq: Once | INTRAVENOUS | Status: AC
  Administered 2014-06-14: 10:00:00 via INTRAVENOUS
  Filled 2014-06-14: qty 4

## 2014-06-14 MED ORDER — HEPARIN SOD (PORK) LOCK FLUSH 100 UNIT/ML IV SOLN
500.0000 [IU] | Freq: Once | INTRAVENOUS | Status: AC | PRN
  Administered 2014-06-14: 500 [IU]
  Filled 2014-06-14: qty 5

## 2014-06-14 MED ORDER — SODIUM CHLORIDE 0.9 % IJ SOLN
10.0000 mL | INTRAMUSCULAR | Status: DC | PRN
  Administered 2014-06-14: 10 mL
  Filled 2014-06-14: qty 10

## 2014-06-14 MED ORDER — ALPRAZOLAM 0.25 MG PO TABS: 0.2500 mg | ORAL_TABLET | Freq: Every evening | ORAL | Status: DC | PRN

## 2014-06-14 MED ORDER — MORPHINE SULFATE ER 60 MG PO TBCR: 60.0000 mg | EXTENDED_RELEASE_TABLET | Freq: Two times a day (BID) | ORAL | Status: DC

## 2014-06-14 NOTE — Progress Notes (Signed)
[facility] [phone]   [phone]  OFFICE PROGRESS NOTE  [name], MD [place] [address]  Diagnosis:  Metastatic non-small cell lung cancer initially diagnosed with locally advanced stage IIIB with right Pancoast tumor involving the vertebral body as well as the foraminal canal invasion with spinal cord compression in October of 2007.   Prior Therapy:  1. Status post concurrent chemoradiation with weekly carboplatin and paclitaxel, last dose was given November 18, 2005. 2. 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 3. Status post gamma knife radiotherapy to a solitary brain lesion located in the superior frontal area of the brain at [facility] in April of 2008. 4. Status post palliative radiotherapy to the lateral abdominal wall metastatic lesion under the care of Dr. Lisbeth Renshaw, completed March of 2009. 5. Status post 6 cycles of systemic chemotherapy with carboplatin and Alimta. Last dose was given July 26, 2007 with disease stabilization. 6. Gamma knife stereotactic radiotherapy to 2 brain lesions one involving the right frontal dural based as well as right parietal lesion performed on 05/07/2012 under the care of Dr. Vallarie Mare at [facility].  Current Therapy:  Maintenance chemotherapy with Alimta 500 mg per meter squared given every 3 weeks. The patient is status post 117 cycles.   CHEMOTHERAPY INTENT: Palliative/maintenance  CURRENT # OF CHEMOTHERAPY CYCLES: 118 CURRENT ANTIEMETICS: Zofran, dexamethasone and Compazine  CURRENT SMOKING STATUS: Quit smoking 10/07/2005  ORAL CHEMOTHERAPY AND CONSENT: None  CURRENT BISPHOSPHONATES USE: None  PAIN MANAGEMENT: 0/10 currently on morphine  NARCOTICS INDUCED CONSTIPATION: No constipation  LIVING WILL AND CODE STATUS: No CODE BLUE   INTERVAL HISTORY: Danielle [name] 64 y.o. female  returns to the clinic today for followup visit accompanied by her sister. The patient is feeling fine today with no specific complaints. No significant fever or chills. She is tolerating her maintenance treatment with single agent Alimta fairly well with no significant adverse effects. The patient denied having any chest pain, shortness of breath, cough or hemoptysis. No nausea or vomiting. The patient denied having any weight loss or night sweats. She is here today to start cycle #118 her chemotherapy.  MEDICAL HISTORY: Past Medical History  Diagnosis Date  . Hypertension   . Anemia   . H/O: pneumonia   . History of tobacco abuse quit 10/08    on nicotine patch  . Fibromyalgia   . Hypokalemia   . DJD (degenerative joint disease), cervical   . Thrush 12/11/2010  . 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 had chemo and radiation for lung ca  . CVA (cerebral vascular accident) 04/2014    pt states had 2 cva's within 2 wks    ALLERGIES:  is allergic to contrast media; iohexol; sulfa drugs cross reactors; and co-trimoxazole injection.  MEDICATIONS:  Current Outpatient Prescriptions  Medication Sig Dispense Refill  . ALPRAZolam (XANAX) 0.25 MG tablet Take 1 tablet (0.25 mg total) by mouth at bedtime as needed for sleep. 30 tablet 0  . amLODipine (NORVASC) 10 MG tablet TAKE 1 TABLET (10 MG TOTAL) BY MOUTH DAILY. 90 tablet 1  . aspirin 325 MG EC tablet Take 1 tablet (325 mg total) by mouth daily. 30 tablet 0  . atorvastatin (LIPITOR) 40 MG tablet Take 1 tablet (40 mg total) by mouth daily at 6 PM. 90 tablet 3  . fluticasone (FLONASE)  50 MCG/ACT nasal spray USE 1-2 SPRAYS IN EACH NOSTRIL DAILY (Patient taking differently: USE 1-2 SPRAYS IN EACH NOSTRIL DAILY PRN CONGESTION) 16 g 11  . folic acid (FOLVITE) 1 MG tablet TAKE 1 TABLET BY MOUTH DAILY. 90 tablet 1  . lacosamide (VIMPAT) 50 MG TABS tablet Take 1 tablet (50 mg total) by mouth 2 (two) times daily. 60 tablet 0   . lidocaine-prilocaine (EMLA) cream Apply to Port-A-Cath as directed 30 g 1  . lisinopril (PRINIVIL,ZESTRIL) 20 MG tablet TAKE 2 TABLETS (40 MG TOTAL) BY MOUTH DAILY. (Patient taking differently: TAKE 1 TABLET (20MG TOTAL) BY MOUTH DAILY AT BEDTIME) 60 tablet 5  . morphine (MS CONTIN) 60 MG 12 hr tablet Take 1 tablet (60 mg total) by mouth 2 (two) times daily. 60 tablet 0  . morphine (MSIR) 30 MG tablet Take 1 tablet (30 mg total) by mouth every 4 (four) hours as needed (breakthrough pain). 60 tablet 0  . olopatadine (PATANOL) 0.1 % ophthalmic solution Place 1 drop into both eyes 2 (two) times daily. 5 mL 11  . ondansetron (ZOFRAN) 8 MG tablet Take 1 tablet (8 mg total) by mouth 2 (two) times daily as needed for nausea or vomiting. 30 tablet 2  . predniSONE (DELTASONE) 50 MG tablet Take 50 mg by mouth See admin instructions. Take 1 tablet (50 mg) 13 hours before CT scan, 7 hours before CT scan and 1 hour before CT scan (every 9 weeks)     No current facility-administered medications for this visit.    SURGICAL HISTORY:  Past Surgical History  Procedure Laterality Date  . Cervical laminectomy  1995  . Other surgical history  2008    Gamma knife surgery to remove brain met   . 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 Knee surgery  1990    Left x 2  . Portacath placement  -[name]    TIP IN CAVOATRIAL JUNCTION  . Loop recorder implant N/A 04/19/2014    Procedure: LOOP RECORDER IMPLANT;  Surgeon: Thompson Grayer, MD;  Location: [facility] C[facility] LAB;  Service: Cardiovascular;  Laterality: N/A;  . Tee without cardioversion N/A 04/19/2014    Procedure: TRANSESOPHAGEAL ECHOCARDIOGRAM (TEE);  Surgeon: Larey Dresser, MD;  Location: [facility];  Service: Cardiovascular;  Laterality: N/A;    REVIEW OF SYSTEMS:  Constitutional: negative Eyes: negative Ears, nose, mouth, throat, and face: negative Respiratory: negative Cardiovascular: negative Gastrointestinal:  negative Genitourinary:negative Integument/breast: negative Hematologic/lymphatic: negative Musculoskeletal:negative Neurological: negative Behavioral/Psych: negative Endocrine: negative Allergic/Immunologic: negative   PHYSICAL EXAMINATION: General appearance: alert, cooperative and no distress Head: Normocephalic, without obvious abnormality, atraumatic Neck: no adenopathy, no JVD, supple, symmetrical, trachea midline and thyroid not enlarged, symmetric, no tenderness/mass/nodules Lymph nodes: Cervical, supraclavicular, and axillary nodes normal. Resp: clear to auscultation bilaterally Back: symmetric, no curvature. ROM normal. No CVA tenderness. Cardio: regular rate and rhythm, S1, S2 normal, no murmur, click, rub or gallop GI: soft, non-tender; bowel sounds normal; no masses,  no organomegaly Extremities: extremities normal, atraumatic, no cyanosis or edema Neurologic: Alert and oriented X 3, normal strength and tone. Normal symmetric reflexes. Normal coordination and gait  ECOG PERFORMANCE STATUS: 0 - Asymptomatic  Blood pressure 152/75, pulse 105, temperature 98.1 F (36.7 C), temperature source Oral, resp. rate 18, height _0  (1.6 m), weight 154 lb 6.4 oz (70.035 kg), SpO2 98 %.  LABORATORY DATA: Lab Results  Component Value Date   WBC 10.0 06/14/2014  HGB 12.6 06/14/2014   HCT 38.1 06/14/2014   MCV 90.3 06/14/2014   PLT 391 06/14/2014      Chemistry      Component Value Date/Time   NA 145 05/24/2014 0912   NA 135 04/26/2014 1205   K 4.1 05/24/2014 0912   K 4.2 04/26/2014 1205   CL 103 04/26/2014 1205   CL 106 06/30/2012 1004   CO2 24 05/24/2014 0912   CO2 29 04/26/2014 1205   BUN 9.9 05/24/2014 0912   BUN 17 04/26/2014 1205   CREATININE 0.7 05/24/2014 0912   CREATININE 0.67 04/26/2014 1205      Component Value Date/Time   CALCIUM 9.9 05/24/2014 0912   CALCIUM 9.5 04/26/2014 1205   ALKPHOS 89 05/24/2014 0912   ALKPHOS 57 04/26/2014 1205   AST 22  05/24/2014 0912   AST 15 04/26/2014 1205   ALT 14 05/24/2014 0912   ALT 10 04/26/2014 1205   BILITOT 0.33 05/24/2014 0912   BILITOT 0.4 04/26/2014 1205       RADIOGRAPHIC STUDIES: No results found. ASSESSMENT AND PLAN: This is a very pleasant 64 years old white female with metastatic non-small cell lung cancer currently undergoing maintenance chemotherapy with single agent Alimta status post 117 cycles. The patient is tolerating her treatment fairly well with no significant adverse effects.  I recommended for the patient to proceed with her systemic therapy today as scheduled. She will receive cycle #118 today. The patient would come back for followup visit in 3 weeks for evaluation before starting cycle #119. For pain management, she will continue on her current pain medication with MS Contin and MS IR.  She was advised to call immediately if she has any concerning symptoms in the interval. The patient voices understanding of current disease status and treatment options and is in agreement with the current care plan.  All questions were answered. The patient knows to call the clinic with any problems, questions or concerns. We can certainly see the patient much sooner if necessary.  Disclaimer: This note was dictated with voice recognition software. Similar sounding words can inadvertently be transcribed and may not be corrected upon review.

## 2014-06-14 NOTE — Progress Notes (Signed)
BMS [id number] - questionnaires (PROs) - patient into the cancer center for routine visit. Patient was given PROs upon arrival to the cancer center. The patient completed her PROs (EQ-5D-3L first and then the LCSS booklet) before any study procedures were performed. I checked the PROs for completeness.  Research blood was drawn. The patient was thanked for her continued support of this clinical trial. Barb Chandi Nicklin 06/14/2014.08:12AM

## 2014-06-14 NOTE — Addendum Note (Signed)
Addended by: Lucile Crater on: 06/14/2014 10:47 AM   Modules accepted: Orders

## 2014-06-14 NOTE — Telephone Encounter (Signed)
per pof to sch pt appt-gave pt copy of avs °

## 2014-06-14 NOTE — Progress Notes (Addendum)
Pt being treated in infusion room, request refill on pain meds and xanax.  Reviewed with MD, rx taken to pt.

## 2014-06-14 NOTE — Patient Instructions (Signed)
[facility] Discharge Instructions for Patients Receiving Chemotherapy  Today you received the following chemotherapy agents Alimta  To help prevent nausea and vomiting after your treatment, we encourage you to take your nausea medication   If you develop nausea and vomiting that is not controlled by your nausea medication, call the clinic.   BELOW ARE SYMPTOMS THAT SHOULD BE REPORTED IMMEDIATELY:  *FEVER GREATER THAN 100.5 F  *CHILLS WITH OR WITHOUT FEVER  NAUSEA AND VOMITING THAT IS NOT CONTROLLED WITH YOUR NAUSEA MEDICATION  *UNUSUAL SHORTNESS OF BREATH  *UNUSUAL BRUISING OR BLEEDING  TENDERNESS IN MOUTH AND THROAT WITH OR WITHOUT PRESENCE OF ULCERS  *URINARY PROBLEMS  *BOWEL PROBLEMS  UNUSUAL RASH Items with * indicate a potential emergency and should be followed up as soon as possible.  Feel free to call the clinic you have any questions or concerns. The clinic phone number is [phone].  Please show the CHEMO ALERT CARD at check-in to the Emergency Department and triage nurse.   

## 2014-06-15 ENCOUNTER — Other Ambulatory Visit: Payer: Self-pay | Admitting: Internal Medicine

## 2014-06-17 ENCOUNTER — Ambulatory Visit (INDEPENDENT_AMBULATORY_CARE_PROVIDER_SITE_OTHER): Payer: Medicare Other | Admitting: *Deleted

## 2014-06-17 DIAGNOSIS — I639 Cerebral infarction, unspecified: Secondary | ICD-10-CM

## 2014-06-20 ENCOUNTER — Encounter: Payer: Self-pay | Admitting: Internal Medicine

## 2014-06-20 ENCOUNTER — Ambulatory Visit: Payer: Medicare Other

## 2014-06-20 DIAGNOSIS — R4701 Aphasia: Secondary | ICD-10-CM | POA: Diagnosis not present

## 2014-06-20 LAB — CUP PACEART REMOTE DEVICE CHECK: MDC IDC SESS DTM: 20160613162756

## 2014-06-20 NOTE — Progress Notes (Signed)
Loop recorder 

## 2014-06-20 NOTE — Therapy (Signed)
[facility] [address] [place], [place], [address] Phone: [phone]   Fax:  [phone]  Speech Language Pathology Treatment  Patient Details  Name: Danielle [name] MRN: [id number] Date of Birth: May 15, 1950 Referring Provider:  [name], MD  Encounter Date: 06/20/2014      End of Session - 06/20/14 0915    Visit Number 11   Number of Visits 17   Date for SLP Re-Evaluation 07/04/14   SLP Start Time 0850   SLP Stop Time  4854   SLP Time Calculation (min) 24 min   Activity Tolerance Patient tolerated treatment well      Past Medical History  Diagnosis Date  . Hypertension   . Anemia   . H/O: pneumonia   . History of tobacco abuse quit 10/08    on nicotine patch  . Fibromyalgia   . Hypokalemia   . DJD (degenerative joint disease), cervical   . Thrush 12/11/2010  . 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 had chemo and radiation for lung ca  . CVA (cerebral vascular accident) 04/2014    pt states had 2 cva's within 2 wks    Past Surgical History  Procedure Laterality Date  . Cervical laminectomy  1995  . Other surgical history  2008    Gamma knife surgery to remove brain met   . 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 Knee surgery  1990    Left x 2  . Portacath placement  -[name]    TIP IN CAVOATRIAL JUNCTION  . Loop recorder implant N/A 04/19/2014    Procedure: LOOP RECORDER IMPLANT;  Surgeon: Thompson Grayer, MD;  Location: [facility] CATH LAB;  Service: Cardiovascular;  Laterality: N/A;  . Tee without cardioversion N/A 04/19/2014    Procedure: TRANSESOPHAGEAL ECHOCARDIOGRAM (TEE);  Surgeon: Larey Dresser, MD;  Location: [facility];  Service: Cardiovascular;  Laterality: N/A;    There were no vitals filed for this visit.  Visit Diagnosis: Aphasia             ADULT SLP TREATMENT - 06/20/14 0903    General Information   Behavior/Cognition Alert;Cooperative;Pleasant mood   Treatment Provided   Treatment provided Cognitive-Linquistic   Pain Assessment   Pain Assessment 0-10   Pain Score 7    Pain Location back   Pain Descriptors / Indicators Aching   Pain Intervention(s) Monitored during session;Limited activity within patient's tolerance   Cognitive-Linquistic Treatment   Treatment focused on Aphasia   Skilled Treatment Speech tx <30 minutes: In 25 minutes variable difficulty conversation, pt without notable difficulty iwth anomia/dysnomia. Pt's auditory comprehension is WNL for converastion in last two sessions as well.    Assessment / Recommendations / Plan   Plan All goals met   Progression Toward Goals   Progression toward goals Goals met, education completed, patient discharged from [place] - 06/09/14 0950    SLP SHORT TERM GOAL #1   Title pt engage in confrontation naming (non-common items) 85% success   Time 1   Period Weeks   Status Achieved   SLP SHORT TERM GOAL #2   Title pt generate simple sentence responses with 80% success and rare min A   Time 1   Period Weeks   Status Achieved   SLP SHORT TERM GOAL #  3   Title pt participate in functional simple conversation (using verbal expression strategeies) for 8 minutes with rare min A    Time 1   Period Weeks   Status Achieved   SLP SHORT TERM GOAL #4   Title pt will demo error awareness in conversation 90% of the time   Status Achieved          SLP Long Term Goals - 06/21/14 0916    SLP LONG TERM GOAL #1   Title pt will participate in 8 minutes functional mod complex conversation with modified independence (compensations) over two sessions   Status Achieved   SLP LONG TERM GOAL #2   Title pt will use compensations for expressive aphasia in mod complex/complex conversation with rare min a to do so   Status Achieved   SLP LONG TERM GOAL #3   Title pt will demo understanding of mod complex  conversation with rare repeats necessary   Status Achieved   SLP LONG TERM GOAL #4   Title pt to demo copmensations for verbal expression over 15 minute conversation independently   Time 2   Period --  visits   Status Achieved          Plan - 06/21/14 0916    Clinical Impression Statement Discharge today, as pt's progress continued to today's session. LAnugage skills appear as WFL/WNL.   Treatment/Interventions Language facilitation;Cueing hierarchy;Functional tasks;Patient/family education;Compensatory strategies;SLP instruction and feedback          G-Codes - 2014/06/21 0917    Functional Assessment Tool Used noms, clinical judgement   Functional Limitations Spoken language expressive   Spoken Language Expression Goal Status [phone]) At least 1 percent but less than 20 percent impaired, limited or restricted   Spoken Language Expression Discharge Status [phone]) 0 percent impaired, limited or restricted     [place]  Visits from Start of Care: 11  Current functional level related to goals / functional outcomes: Pt met all goals. There is currently no sign of anomia/dysnomia in her conversational speech, which is quite an improvement over her initial evaluation 6 weeks ago. Pt reports her speech is essentially at baseline.   Remaining deficits: No notable deficits   Education / Equipment: Compensations for expressive language deficits.  Plan: Patient agrees to discharge.  Patient goals were met. Patient is being discharged due to meeting the stated rehab goals.  ?????         Problem List Patient Active Problem List   Diagnosis Date Noted  . Encounter for antineoplastic chemotherapy 06/14/2014  . Brain lesion   . Expressive aphasia   . Cerebral infarction due to embolism of cerebral artery   . HLD (hyperlipidemia)   . Acute encephalopathy 04/17/2014  . Acute CVA (cerebrovascular accident) 04/17/2014  . AKI (acute kidney injury)  04/16/2014  . H/O therapeutic radiation 01/12/2014  . Cancer-related pain 12/28/2013  . Atherosclerosis of aorta 08/13/2013  . Brain metastases 10/28/2011  . Cancer of upper lobe of right lung 11/19/2010  . H/O neoplasm 10/22/2010  . Lung cancer, primary, with metastasis from lung to other site 10/22/2010  . BENIGN POSITIONAL VERTIGO 06/13/2009  . CONSTIPATION 04/07/2009  . Diabetes mellitus 09/29/2008  . HYPERCHOLESTEROLEMIA 09/29/2008  . INSOMNIA 02/22/2008  . ALLERGIC CONJUNCTIVITIS 01/20/2008  . PULMONARY NOCARDIOSIS 01/07/2008  . Essential hypertension 01/07/2008  . ANEMIA-NOS 03/31/2006  . LUNG CANCER, HX OF 03/31/2006  . PNEUMONIA, HX OF 03/31/2006  . LAMINECTOMY, HX OF 03/31/2006    [facility] ,  MS, CCC-SLP  06/20/2014, 9:18 AM  [facility] [address] [place], [place], [address] Phone: [phone]   Fax:  [phone]

## 2014-06-27 ENCOUNTER — Ambulatory Visit: Payer: Medicare Other

## 2014-07-04 ENCOUNTER — Other Ambulatory Visit: Payer: Self-pay | Admitting: Physician Assistant

## 2014-07-04 ENCOUNTER — Other Ambulatory Visit: Payer: Self-pay

## 2014-07-04 DIAGNOSIS — C3411 Malignant neoplasm of upper lobe, right bronchus or lung: Secondary | ICD-10-CM

## 2014-07-05 ENCOUNTER — Ambulatory Visit (HOSPITAL_BASED_OUTPATIENT_CLINIC_OR_DEPARTMENT_OTHER): Payer: Medicare Other | Admitting: Physician Assistant

## 2014-07-05 ENCOUNTER — Other Ambulatory Visit: Payer: Self-pay | Admitting: Internal Medicine

## 2014-07-05 ENCOUNTER — Ambulatory Visit (HOSPITAL_BASED_OUTPATIENT_CLINIC_OR_DEPARTMENT_OTHER): Payer: Medicare Other

## 2014-07-05 ENCOUNTER — Telehealth: Payer: Self-pay | Admitting: Physician Assistant

## 2014-07-05 ENCOUNTER — Other Ambulatory Visit: Payer: Medicare Other

## 2014-07-05 ENCOUNTER — Other Ambulatory Visit (HOSPITAL_BASED_OUTPATIENT_CLINIC_OR_DEPARTMENT_OTHER): Payer: Medicare Other

## 2014-07-05 ENCOUNTER — Other Ambulatory Visit: Payer: Self-pay | Admitting: Family Medicine

## 2014-07-05 ENCOUNTER — Encounter: Payer: Self-pay | Admitting: Internal Medicine

## 2014-07-05 ENCOUNTER — Encounter: Payer: Self-pay | Admitting: Physician Assistant

## 2014-07-05 ENCOUNTER — Telehealth: Payer: Self-pay | Admitting: *Deleted

## 2014-07-05 VITALS — BP 160/73 | HR 131 | Temp 98.0°F | Resp 18 | Ht 63.0 in | Wt 148.3 lb

## 2014-07-05 DIAGNOSIS — C7931 Secondary malignant neoplasm of brain: Secondary | ICD-10-CM | POA: Diagnosis not present

## 2014-07-05 DIAGNOSIS — B370 Candidal stomatitis: Secondary | ICD-10-CM

## 2014-07-05 DIAGNOSIS — C3410 Malignant neoplasm of upper lobe, unspecified bronchus or lung: Secondary | ICD-10-CM

## 2014-07-05 DIAGNOSIS — C3411 Malignant neoplasm of upper lobe, right bronchus or lung: Secondary | ICD-10-CM | POA: Diagnosis not present

## 2014-07-05 DIAGNOSIS — Z5111 Encounter for antineoplastic chemotherapy: Secondary | ICD-10-CM | POA: Diagnosis not present

## 2014-07-05 LAB — COMPREHENSIVE METABOLIC PANEL (CC13)
ALT: 12 U/L (ref 0–55)
AST: 18 U/L (ref 5–34)
Albumin: 3.7 g/dL (ref 3.5–5.0)
Alkaline Phosphatase: 102 U/L (ref 40–150)
Anion Gap: 12 mEq/L — ABNORMAL HIGH (ref 3–11)
BUN: 9.6 mg/dL (ref 7.0–26.0)
CALCIUM: 10.1 mg/dL (ref 8.4–10.4)
CHLORIDE: 102 meq/L (ref 98–109)
CO2: 25 meq/L (ref 22–29)
CREATININE: 0.8 mg/dL (ref 0.6–1.1)
EGFR: 75 mL/min/{1.73_m2} — AB (ref >90)
Glucose: 197 mg/dl — ABNORMAL HIGH (ref 70–140)
POTASSIUM: 3.6 meq/L (ref 3.5–5.1)
SODIUM: 139 meq/L (ref 136–145)
TOTAL PROTEIN: 7.4 g/dL (ref 6.4–8.3)
Total Bilirubin: 0.51 mg/dL (ref 0.20–1.20)

## 2014-07-05 LAB — CBC WITH DIFFERENTIAL/PLATELET
BASO%: 0.1 % (ref 0.0–2.0)
Basophils Absolute: 0 10*3/uL (ref 0.0–0.1)
EOS%: 0 % (ref 0.0–7.0)
Eosinophils Absolute: 0 10*3/uL (ref 0.0–0.5)
HCT: 39.6 % (ref 34.8–46.6)
HGB: 13.2 g/dL (ref 11.6–15.9)
LYMPH#: 1.6 10*3/uL (ref 0.9–3.3)
LYMPH%: 10.8 % — ABNORMAL LOW (ref 14.0–49.7)
MCH: 29.3 pg (ref 25.1–34.0)
MCHC: 33.3 g/dL (ref 31.5–36.0)
MCV: 88 fL (ref 79.5–101.0)
MONO#: 1.5 10*3/uL — AB (ref 0.1–0.9)
MONO%: 10.1 % (ref 0.0–14.0)
NEUT#: 12 10*3/uL — ABNORMAL HIGH (ref 1.5–6.5)
NEUT%: 79 % — AB (ref 38.4–76.8)
PLATELETS: 599 10*3/uL — AB (ref 145–400)
RBC: 4.5 10*6/uL (ref 3.70–5.45)
RDW: 15.4 % — ABNORMAL HIGH (ref 11.2–14.5)
WBC: 15.1 10*3/uL — ABNORMAL HIGH (ref 3.9–10.3)
nRBC: 0 % (ref 0–0)

## 2014-07-05 MED ORDER — HEPARIN SOD (PORK) LOCK FLUSH 100 UNIT/ML IV SOLN
500.0000 [IU] | Freq: Once | INTRAVENOUS | Status: AC | PRN
  Administered 2014-07-05: 500 [IU]
  Filled 2014-07-05: qty 5

## 2014-07-05 MED ORDER — SODIUM CHLORIDE 0.9 % IV SOLN
Freq: Once | INTRAVENOUS | Status: AC
  Administered 2014-07-05: 10:00:00 via INTRAVENOUS

## 2014-07-05 MED ORDER — SODIUM CHLORIDE 0.9 % IV SOLN
Freq: Once | INTRAVENOUS | Status: AC
  Administered 2014-07-05: 10:00:00 via INTRAVENOUS
  Filled 2014-07-05: qty 4

## 2014-07-05 MED ORDER — SODIUM CHLORIDE 0.9 % IJ SOLN
10.0000 mL | INTRAMUSCULAR | Status: DC | PRN
  Administered 2014-07-05: 10 mL
  Filled 2014-07-05: qty 10

## 2014-07-05 MED ORDER — FLUCONAZOLE 100 MG PO TABS: 100.0000 mg | ORAL_TABLET | Freq: Every day | ORAL | Status: DC

## 2014-07-05 MED ORDER — SODIUM CHLORIDE 0.9 % IV SOLN
500.0000 mg/m2 | Freq: Once | INTRAVENOUS | Status: AC
  Administered 2014-07-05: 900 mg via INTRAVENOUS
  Filled 2014-07-05: qty 36

## 2014-07-05 MED ORDER — MORPHINE SULFATE 30 MG PO TABS: 30.0000 mg | ORAL_TABLET | ORAL | Status: DC | PRN

## 2014-07-05 NOTE — Patient Instructions (Signed)
[facility] Discharge Instructions for Patients Receiving Chemotherapy  Today you received the following chemotherapy agents Alimta  To help prevent nausea and vomiting after your treatment, we encourage you to take your nausea medication   If you develop nausea and vomiting that is not controlled by your nausea medication, call the clinic.   BELOW ARE SYMPTOMS THAT SHOULD BE REPORTED IMMEDIATELY:  *FEVER GREATER THAN 100.5 F  *CHILLS WITH OR WITHOUT FEVER  NAUSEA AND VOMITING THAT IS NOT CONTROLLED WITH YOUR NAUSEA MEDICATION  *UNUSUAL SHORTNESS OF BREATH  *UNUSUAL BRUISING OR BLEEDING  TENDERNESS IN MOUTH AND THROAT WITH OR WITHOUT PRESENCE OF ULCERS  *URINARY PROBLEMS  *BOWEL PROBLEMS  UNUSUAL RASH Items with * indicate a potential emergency and should be followed up as soon as possible.  Feel free to call the clinic you have any questions or concerns. The clinic phone number is [phone].  Please show the CHEMO ALERT CARD at check-in to the Emergency Department and triage nurse.   

## 2014-07-05 NOTE — Patient Instructions (Signed)
Take the Diflucan as prescribed to treat your thrush. Remember not to take your cholesterol medication when taking the Diflucan Follow-up in 3 weeks prior to your next cycle of maintenance chemotherapy

## 2014-07-05 NOTE — Telephone Encounter (Signed)
per pof to sch pt appt-sent Mohamed an email to advise no openings and sch w/[name]-sent MW email to sch trmt-gave pt copy of sch

## 2014-07-05 NOTE — Progress Notes (Signed)
[facility] [phone]   [phone]  OFFICE PROGRESS NOTE  [name], MD [place] [address]  Diagnosis:  Metastatic non-small cell lung cancer initially diagnosed with locally advanced stage IIIB with right Pancoast tumor involving the vertebral body as well as the foraminal canal invasion with spinal cord compression in October of 2007.   Prior Therapy:  1. Status post concurrent chemoradiation with weekly carboplatin and paclitaxel, last dose was given November 18, 2005. 2. 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 3. Status post gamma knife radiotherapy to a solitary brain lesion located in the superior frontal area of the brain at [facility] in April of 2008. 4. Status post palliative radiotherapy to the lateral abdominal wall metastatic lesion under the care of Dr. Lisbeth Renshaw, completed March of 2009. 5. Status post 6 cycles of systemic chemotherapy with carboplatin and Alimta. Last dose was given July 26, 2007 with disease stabilization. 6. Gamma knife stereotactic radiotherapy to 2 brain lesions one involving the right frontal dural based as well as right parietal lesion performed on 05/07/2012 under the care of Dr. Vallarie Mare at [facility].  Current Therapy:  Maintenance chemotherapy with Alimta 500 mg per meter squared given every 3 weeks. The patient is status post 118 cycles.   CHEMOTHERAPY INTENT: Palliative/maintenance  CURRENT # OF CHEMOTHERAPY CYCLES: 119 CURRENT ANTIEMETICS: Zofran, dexamethasone and Compazine  CURRENT SMOKING STATUS: Quit smoking 10/07/2005  ORAL CHEMOTHERAPY AND CONSENT: None  CURRENT BISPHOSPHONATES USE: None  PAIN MANAGEMENT: 0/10 currently on morphine  NARCOTICS INDUCED CONSTIPATION: No constipation  LIVING WILL AND CODE STATUS: No CODE BLUE   INTERVAL HISTORY: Danielle [name] 64 y.o. female  returns to the clinic today for followup visit accompanied by her son. She reports having episodes of vomiting yesterday and generally not feeling well. Her weight is down about 6 pounds since her last office visit. Her 4-year-old 72-year-old grandchildren are currently battling hand foot syndrome. Her 56 year old son thus far has been spared. She request a refill for her MSI R 30 mg tablets. She continues to tolerate her maintenance Alimta without difficulty. She has recently been released from speech therapy. The patient is feeling better today with no specific complaints. No significant fever or chills. She is tolerating her maintenance treatment with single agent Alimta fairly well with no significant adverse effects. The patient denied having any chest pain, shortness of breath, cough or hemoptysis. No current nausea or vomiting. The patient denied having any night sweats. She is here today to start cycle #119 her chemotherapy.  MEDICAL HISTORY: Past Medical History  Diagnosis Date  . Hypertension   . Anemia   . H/O: pneumonia   . History of tobacco abuse quit 10/08    on nicotine patch  . Fibromyalgia   . Hypokalemia   . DJD (degenerative joint disease), cervical   . Thrush 12/11/2010  . 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 had chemo and radiation for lung ca  . CVA (cerebral vascular accident) 04/2014    pt states had 2 cva's within 2 wks    ALLERGIES:  is allergic to contrast media; iohexol; sulfa drugs cross reactors; and co-trimoxazole injection.  MEDICATIONS:  Current Outpatient Prescriptions  Medication Sig Dispense Refill  . ALPRAZolam (XANAX) 0.25 MG tablet Take 1 tablet (0.25 mg total) by mouth at bedtime as needed for  sleep. 30 tablet 0  . amLODipine (NORVASC) 10 MG tablet TAKE 1 TABLET (10 MG TOTAL) BY MOUTH DAILY. 90 tablet 1  . aspirin EC 325 MG tablet TAKE 1 TABLET (325 MG TOTAL) BY MOUTH DAILY. 30 tablet 0  . atorvastatin (LIPITOR) 40 MG  tablet Take 1 tablet (40 mg total) by mouth daily at 6 PM. 90 tablet 3  . fluticasone (FLONASE) 50 MCG/ACT nasal spray USE 1-2 SPRAYS IN EACH NOSTRIL DAILY (Patient taking differently: USE 1-2 SPRAYS IN EACH NOSTRIL DAILY PRN CONGESTION) 16 g 11  . folic acid (FOLVITE) 1 MG tablet TAKE 1 TABLET BY MOUTH DAILY. 90 tablet 1  . lacosamide (VIMPAT) 50 MG TABS tablet Take 1 tablet (50 mg total) by mouth 2 (two) times daily. 60 tablet 0  . lidocaine-prilocaine (EMLA) cream Apply to Port-A-Cath as directed 30 g 1  . lisinopril (PRINIVIL,ZESTRIL) 20 MG tablet TAKE 2 TABLETS (40 MG TOTAL) BY MOUTH DAILY. 60 tablet 5  . morphine (MS CONTIN) 60 MG 12 hr tablet Take 1 tablet (60 mg total) by mouth 2 (two) times daily. 60 tablet 0  . morphine (MSIR) 30 MG tablet Take 1 tablet (30 mg total) by mouth every 4 (four) hours as needed (breakthrough pain). 60 tablet 0  . olopatadine (PATANOL) 0.1 % ophthalmic solution Place 1 drop into both eyes 2 (two) times daily. 5 mL 11  . ondansetron (ZOFRAN) 8 MG tablet Take 1 tablet (8 mg total) by mouth 2 (two) times daily as needed for nausea or vomiting. 30 tablet 2  . predniSONE (DELTASONE) 50 MG tablet Take 50 mg by mouth See admin instructions. Take 1 tablet (50 mg) 13 hours before CT scan, 7 hours before CT scan and 1 hour before CT scan (every 9 weeks)    . fluconazole (DIFLUCAN) 100 MG tablet Take 1 tablet (100 mg total) by mouth daily. Do not take atorvastatin while taking fluconazole 10 tablet 1   No current facility-administered medications for this visit.   Facility-Administered Medications Ordered in Other Visits  Medication Dose Route Frequency Provider Last Rate Last Dose  . sodium chloride 0.9 % injection 10 mL  10 mL Intracatheter PRN Curt Bears, MD   10 mL at 07/05/14 1051    SURGICAL HISTORY:  Past Surgical History  Procedure Laterality Date  . Cervical laminectomy  1995  . Other surgical history  2008    Gamma knife surgery to remove brain met     . 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 Knee surgery  1990    Left x 2  . Portacath placement  -[name]    TIP IN CAVOATRIAL JUNCTION  . Loop recorder implant N/A 04/19/2014    Procedure: LOOP RECORDER IMPLANT;  Surgeon: Thompson Grayer, MD;  Location: [facility] CATH LAB;  Service: Cardiovascular;  Laterality: N/A;  . Tee without cardioversion N/A 04/19/2014    Procedure: TRANSESOPHAGEAL ECHOCARDIOGRAM (TEE);  Surgeon: Larey Dresser, MD;  Location: [facility];  Service: Cardiovascular;  Laterality: N/A;    REVIEW OF SYSTEMS:  Constitutional: positive for anorexia, fatigue and weight loss Eyes: negative Ears, nose, mouth, throat, and face: negative Respiratory: negative Cardiovascular: negative Gastrointestinal: positive for nausea and vomiting Genitourinary:negative Integument/breast: negative Hematologic/lymphatic: negative Musculoskeletal:negative Neurological: negative Behavioral/Psych: negative Endocrine: negative Allergic/Immunologic: negative   PHYSICAL EXAMINATION: General appearance: alert, cooperative and no distress Head: Normocephalic, without obvious abnormality, atraumatic Neck: no adenopathy, no JVD, supple, symmetrical, trachea midline  and thyroid not enlarged, symmetric, no tenderness/mass/nodules Lymph nodes: Cervical, supraclavicular, and axillary nodes normal. Resp: clear to auscultation bilaterally Back: symmetric, no curvature. ROM normal. No CVA tenderness. Cardio: regular rate and rhythm, S1, S2 normal, no murmur, click, rub or gallop GI: soft, non-tender; bowel sounds normal; no masses,  no organomegaly Extremities: extremities normal, atraumatic, no cyanosis or edema Neurologic: Alert and oriented X 3, normal strength and tone. Normal symmetric reflexes. Normal coordination and gait Mouth: reveals thrush  ECOG PERFORMANCE STATUS: 0 - Asymptomatic  Blood pressure 160/73, pulse 131, temperature 98 F (36.7 C), temperature  source Oral, resp. rate 18, height _0  (1.6 m), weight 148 lb 4.8 oz (67.268 kg), SpO2 100 %.  LABORATORY DATA: Lab Results  Component Value Date   WBC 15.1* 07/05/2014   HGB 13.2 07/05/2014   HCT 39.6 07/05/2014   MCV 88.0 07/05/2014   PLT 599* 07/05/2014      Chemistry      Component Value Date/Time   NA 139 07/05/2014 0823   NA 135 04/26/2014 1205   K 3.6 07/05/2014 0823   K 4.2 04/26/2014 1205   CL 103 04/26/2014 1205   CL 106 06/30/2012 1004   CO2 25 07/05/2014 0823   CO2 29 04/26/2014 1205   BUN 9.6 07/05/2014 0823   BUN 17 04/26/2014 1205   CREATININE 0.8 07/05/2014 0823   CREATININE 0.67 04/26/2014 1205      Component Value Date/Time   CALCIUM 10.1 07/05/2014 0823   CALCIUM 9.5 04/26/2014 1205   ALKPHOS 102 07/05/2014 0823   ALKPHOS 57 04/26/2014 1205   AST 18 07/05/2014 0823   AST 15 04/26/2014 1205   ALT 12 07/05/2014 0823   ALT 10 04/26/2014 1205   BILITOT 0.51 07/05/2014 0823   BILITOT 0.4 04/26/2014 1205       RADIOGRAPHIC STUDIES: No results found. ASSESSMENT AND PLAN: This is a very pleasant 64 years old white female with metastatic non-small cell lung cancer currently undergoing maintenance chemotherapy with single agent Alimta status post 118 cycles. The patient is tolerating her treatment fairly well with no significant adverse effects.  Patient was discussed with and also seen by Dr. Julien Nordmann. She'll proceed with cycle #119 of her maintenance chemotherapy with single agent Alimta today as scheduled. For the oral candidiasis a prescription for Diflucan was sent her pharmacy of record via E scribe. Patient understands about the take her cholesterol medication while she is on the Diflucan. She was given a refill prescription for her MSIR tablets. She will continue on her MS Contin at the current dose. We are currently obtaining restaging CT scans every 6 cycles of maintenance chemotherapy. Today is cycle #4 since the last restaging CT scan. She will be  due another restaging CT scan after 2 more cycles of maintenance chemotherapy. She will follow-up in 3 weeks prior to the start of cycle #120.  She was advised to call immediately if she has any concerning symptoms in the interval. The patient voices understanding of current disease status and treatment options and is in agreement with the current care plan.  All questions were answered. The patient knows to call the clinic with any problems, questions or concerns. We can certainly see the patient much sooner if necessary.  Carlton Adam, PA-C 07/05/2014   ADDENDUM: Hematology/Oncology Attending:  I had a face to face encounter with the patient. I recommended her care plan. This is a very pleasant 64 years old white female with metastatic non-small  cell lung cancer,  Currently undergoing maintenance chemotherapy with single agent Alimta status post 118 cycles. The patient history rating her maintenance chemotherapy fairly well with no significant adverse effects. I recommended for her to proceed with cycle 119 as scheduled today.  She would come back for follow-up visit in 3 weeks for reevaluation before the next cycle of her treatment.  For pain management the patient will continue on MS Contin and MSIR as scheduled.  She was advised to call immediately if she has any concerning symptoms in the interval.  Disclaimer: This note was dictated with voice recognition software. Similar sounding words can inadvertently be transcribed and may be missed upon review. Eilleen Kempf., MD 07/06/2014

## 2014-07-05 NOTE — Progress Notes (Signed)
Labs reviewed with [name], Angier ok to treat

## 2014-07-05 NOTE — Telephone Encounter (Signed)
Per staff message and POF I have scheduled appts. Advised scheduler of appts. JMW  

## 2014-07-13 ENCOUNTER — Encounter: Payer: Self-pay | Admitting: Skilled Nursing Facility1

## 2014-07-13 NOTE — Progress Notes (Signed)
Subjective:     Patient ID: Danielle [name], female   DOB: 04-10-1950, 64 y.o.   MRN: [id number]  HPI   Review of Systems     Objective:   Physical Exam To assist the pt in identifying dietary strategies to gain some lost wt back.    Assessment:     Pt identified as being malnourished due to some lost wt. Pt contacted via telephone at [phone]. Pt states she is feeling great and has a good appetite. Pt states she will be going on vacation next week and will eat lots of food.    Plan:     No plan identified at this time.

## 2014-07-18 ENCOUNTER — Ambulatory Visit (INDEPENDENT_AMBULATORY_CARE_PROVIDER_SITE_OTHER): Payer: Medicare Other | Admitting: *Deleted

## 2014-07-18 DIAGNOSIS — I639 Cerebral infarction, unspecified: Secondary | ICD-10-CM

## 2014-07-20 NOTE — Progress Notes (Signed)
Loop recorder 

## 2014-07-22 ENCOUNTER — Other Ambulatory Visit: Payer: Self-pay | Admitting: *Deleted

## 2014-07-22 ENCOUNTER — Ambulatory Visit (INDEPENDENT_AMBULATORY_CARE_PROVIDER_SITE_OTHER): Payer: Medicare Other | Admitting: Neurology

## 2014-07-22 ENCOUNTER — Encounter: Payer: Self-pay | Admitting: Neurology

## 2014-07-22 VITALS — BP 99/66 | HR 106 | Ht 63.0 in | Wt 149.2 lb

## 2014-07-22 DIAGNOSIS — C3490 Malignant neoplasm of unspecified part of unspecified bronchus or lung: Secondary | ICD-10-CM | POA: Diagnosis not present

## 2014-07-22 DIAGNOSIS — C799 Secondary malignant neoplasm of unspecified site: Secondary | ICD-10-CM

## 2014-07-22 DIAGNOSIS — E785 Hyperlipidemia, unspecified: Secondary | ICD-10-CM

## 2014-07-22 DIAGNOSIS — I63412 Cerebral infarction due to embolism of left middle cerebral artery: Secondary | ICD-10-CM | POA: Diagnosis not present

## 2014-07-22 DIAGNOSIS — C3410 Malignant neoplasm of upper lobe, unspecified bronchus or lung: Secondary | ICD-10-CM

## 2014-07-22 DIAGNOSIS — I639 Cerebral infarction, unspecified: Secondary | ICD-10-CM | POA: Diagnosis not present

## 2014-07-22 DIAGNOSIS — R9401 Abnormal electroencephalogram [EEG]: Secondary | ICD-10-CM | POA: Diagnosis not present

## 2014-07-22 MED ORDER — MORPHINE SULFATE ER 60 MG PO TBCR: 60.0000 mg | EXTENDED_RELEASE_TABLET | Freq: Two times a day (BID) | ORAL | Status: DC

## 2014-07-22 NOTE — Patient Instructions (Addendum)
-   continue ASA and lipitor for stroke prevention - continue to monitor loop recorder - will repeat EEG - hold off driving for now due to previous abnormal EEG. Will re-evaluate after the repeat EEG. - follow up with oncologist - Follow up with your primary care physician for stroke risk factor modification. Recommend maintain blood pressure goal <130/80, diabetes with hemoglobin A1c goal below 6.5% and lipids with LDL cholesterol goal below 70 mg/dL.  - your BP at low side, recommend to check BP at home and record and bring over to PCP if need BP meds adjustment. - follow up in 3 months.

## 2014-07-22 NOTE — Progress Notes (Signed)
STROKE NEUROLOGY FOLLOW UP NOTE  NAME: Danielle [name] DOB: [birth year]  REASON FOR VISIT: stroke follow up HISTORY FROM: pt and chart  Today we had the pleasure of seeing Danielle [name] in follow-up at our Neurology Clinic. Pt was accompanied by daughter.   History Summary [name] is a 64 y.o. 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 Although hypercoagulable state due to malignancy is a possibility, however, after discuss with her oncologist Dr. Rogue Jury, it was felt less likely. Stroke work up including TTE, CUS, TEE, DVT not revealing, therefore Loop recorder placed. EEG showed bifrontal sharps, so vimpat was started. LDL 105, switched from zocor to lipitor. She was d/c to home with ASA and lipitor and outpt speech therapy.  Interval History During the interval time, the patient has been doing well, language much improved and near at baseline now. She followed up with PCP and vimpat was discontinued with unknown reason. She continues to follow up with oncology for maintenance chemo. Her BP 99/66 but she is on norvasc and metoprolol. She is asking for driving.   REVIEW OF SYSTEMS: Full 14 system review of systems performed and notable only for those listed below and in HPI above, all others are negative:  Constitutional:   Cardiovascular:  Ear/Nose/Throat:   Skin:  Eyes:   Respiratory:   Gastroitestinal:   Genitourinary:  Hematology/Lymphatic:   Endocrine:  Musculoskeletal:   Allergy/Immunology:   Neurological:   Psychiatric:  Sleep:   The following represents the patient's updated allergies and side effects list: Allergies  Allergen Reactions  . Contrast Media [Iodinated  Diagnostic Agents] Hives  . Iohexol Hives     pt needs 13 hour prep--prev hives; added allergy 06/02/07 slg   . Sulfa Drugs Cross Reactors Hives  . Co-Trimoxazole Injection [Sulfamethoxazole-Trimethoprim] Rash    The neurologically relevant items on the patient's problem list were reviewed on today's visit.  Neurologic Examination  A problem focused neurological exam (12 or more points of the single system neurologic examination, vital signs counts as 1 point, cranial nerves count for 8 points) was performed.  Blood pressure 99/66, pulse 106, height '5\' 3"'$  (1.6 m), weight 149 lb 3.2 oz (67.677 kg).  General - Well nourished, well developed, in no apparent distress.  Ophthalmologic - Fundi not visualized due to eye movement.  Cardiovascular - Regular rate and rhythm with no murmur.  Mental Status -  Level of arousal and orientation to time, place, and person were intact. Language including expression, repetition, comprehension was assessed and found intact, but naming 4/5 and has some difficulty with naming part of objects.  Cranial Nerves II - XII - II - Visual field intact OU. III, IV, VI - Extraocular movements intact. V - Facial sensation intact bilaterally. VII - Facial movement intact bilaterally. VIII - Hearing & vestibular intact bilaterally. X - Palate elevates symmetrically. XI - Chin turning & shoulder shrug intact bilaterally. XII - Tongue protrusion intact.  Motor Strength - The patient's strength was normal in all extremities and pronator drift was absent.  Bulk was normal and fasciculations were absent.   Motor Tone - Muscle tone was assessed at the neck and appendages and was normal.  Reflexes - The patient's reflexes were 1+ in  all extremities and she had no pathological reflexes.  Sensory - Light touch, temperature/pinprick, vibration and proprioception, and Romberg testing were assessed and were normal.    Coordination - The patient had normal movements in  the hands and feet with no ataxia or dysmetria.  Tremor was absent.  Gait and Station - The patient's transfers, posture, gait, station, and turns were observed as normal.  Data reviewed: I personally reviewed the images and agree with the radiology interpretations.  Dg Chest 2 View  04/18/2014 IMPRESSION: No active cardiopulmonary disease.   Ct Head (brain) Wo Contrast  04/16/2014 IMPRESSION: Subtle area of low attenuation in the left frontal lobe. This could represent focal ischemia or metastasis. Suggest MRI for further evaluation. No acute intracranial hemorrhage. No significant mass effect.   Mr Danielle [name] Wo Contrast  04/17/2014 IMPRESSION: Moderate-size acute hemorrhagic anterior left frontal lobe infarct with extension to the anterior left peri operculum region and subinsular region. Mild local mass effect. Small acute scattered right parietal lobe infarcts. Remote small right lenticular nucleus infarct. No intracranial enhancing lesion or bony destructive lesion to suggest the presence of intracranial metastatic disease. Paranasal sinus mucosal thickening/ partial opacification most notable maxillary sinuses, ethmoid sinus air cells and right sphenoid sinus.   US Renal  04/17/2014 IMPRESSION: Normal renal ultrasound. No evidence of hydronephrosis. Decompressed bladder.   Mra Head/brain Wo Cm  04/18/2014 IMPRESSION: No large vessel occlusion. Decreased number of distal LEFT middle cerebral artery branches. LEFT frontal lobe hemorrhage versus enhancement partially imaged.    Carotid Doppler Bilateral: 1-39% ICA stenosis. Vertebral artery flow is antegrade.  2D Echocardiogram - Left ventricle: The cavity size was normal. Wall thickness was normal. Systolic function was normal. The estimated ejection fraction was in the range of 55% to 60%. Wall motion was normal; there were no regional wall motion abnormalities. Left ventricular diastolic function  parameters were normal.  UE and LE venous Doppler - negative for DVT  EEG - This is an abnormal electroencephalogram secondary to slowing and sharp activity over the left frontotemporal region consistent with the patient's history of a frontal lobe hemorrhage. Also noted were periods of rhythmical bifrontal sharp activity that are concerning for electrographic and electroclinical seizure activity.   TEE Normal LV size and systolic function, EF 09-32%. Normal regional wall motion. Normal RV size and systolic function. Trivial MR. Trivial TR, peak RV-RA gradient 37 mmHg. Trileaflet aortic valve with no AS or AI. Normal atrial sizes with no LAA thrombus. Catheter ends in right atrium, no clot or vegetation. Negative bubble study, no evidence for PFO or ASD. Normal caliber aorta, grade II plaque descending thoracic aorta.  Component     Latest Ref Rng 04/18/2014  Cholesterol     0 - 200 mg/dL 173  Triglycerides     <150 mg/dL 116  HDL Cholesterol     >39 mg/dL 45  Total CHOL/HDL Ratio      3.8  VLDL     0 - 40 mg/dL 23  LDL (calc)     0 - 99 mg/dL 105 (H)  Hemoglobin A1C     4.8 - 5.6 % 6.1 (H)  Mean Plasma Glucose      128    Assessment: As you may recall, she is a 64 y.o. 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 Although hypercoagulable state due to malignancy is a possibility, however, after discuss with her oncologist Dr. Rogue Jury, it was felt less likely. Stroke work up including TTE, CUS, TEE, DVT not revealing, therefore Loop recorder placed. EEG showed bifrontal sharps, so vimpat was started. She was d/c to home with ASA and lipitor. Overtime, language much improve. Loop recorder no afib so far. However, her  vimpat was d/c with unclear reason. She has no seizure activity. Will need to repeat EEG.   Plan:  - continue ASA and lipitor for stroke prevention - continue to monitor loop recorder - will repeat EEG - follow up with oncologist - Follow up with your primary care physician for stroke risk factor modification. Recommend maintain blood pressure goal <130/80, diabetes with hemoglobin A1c goal below 6.5% and lipids with LDL cholesterol goal below 70 mg/dL.  - your BP at low side, recommend to check BP at home and record and bring over to PCP if need BP meds adjustment. - RTC in 3 months.  Orders Placed This Encounter  Procedures  . EEG adult    Standing Status: Future     Number of Occurrences:      Standing Expiration Date: 07/22/2015    No orders of the defined types were placed in this encounter.    Patient Instructions  - continue ASA and lipitor for stroke prevention - continue to monitor loop recorder - will repeat EEG - hold off driving for now due to previous abnormal EEG. Will re-evaluate after the repeat EEG. - follow up with oncologist - Follow up with your primary care physician for stroke risk factor modification. Recommend maintain blood pressure goal <130/80, diabetes with hemoglobin A1c goal below 6.5% and lipids with LDL cholesterol goal below 70 mg/dL.  - your BP at low side, recommend to check BP at home and record and bring over to PCP if need BP meds adjustment. - follow up in 3 months.   Rosalin Hawking, MD PhD [facility] [address], [place], [place] [address] [phone]

## 2014-07-22 NOTE — Telephone Encounter (Signed)
TC from patient with refill request for MS contin 60 mg. OK to fill-last filled 06/14/14 TC to patient -prescription is ready for pick up before 4 pm today. Pt voiced understanding.

## 2014-07-24 ENCOUNTER — Telehealth: Payer: Self-pay | Admitting: Family Medicine

## 2014-07-24 DIAGNOSIS — E119 Type 2 diabetes mellitus without complications: Secondary | ICD-10-CM

## 2014-07-24 NOTE — Telephone Encounter (Signed)
-----   Message from Ellamae Sia sent at 07/18/2014 10:47 AM EDT ----- Regarding: Lab orders for Monday, 7.18.16 Lab orders for a 3 month f/u

## 2014-07-25 ENCOUNTER — Other Ambulatory Visit (INDEPENDENT_AMBULATORY_CARE_PROVIDER_SITE_OTHER): Payer: Medicare Other

## 2014-07-25 DIAGNOSIS — E119 Type 2 diabetes mellitus without complications: Secondary | ICD-10-CM

## 2014-07-25 LAB — HEMOGLOBIN A1C: Hgb A1c MFr Bld: 6 % (ref 4.6–6.5)

## 2014-07-26 ENCOUNTER — Ambulatory Visit (HOSPITAL_BASED_OUTPATIENT_CLINIC_OR_DEPARTMENT_OTHER): Payer: Medicare Other | Admitting: Physician Assistant

## 2014-07-26 ENCOUNTER — Other Ambulatory Visit (HOSPITAL_BASED_OUTPATIENT_CLINIC_OR_DEPARTMENT_OTHER): Payer: Medicare Other

## 2014-07-26 ENCOUNTER — Ambulatory Visit (HOSPITAL_BASED_OUTPATIENT_CLINIC_OR_DEPARTMENT_OTHER): Payer: Medicare Other

## 2014-07-26 ENCOUNTER — Telehealth: Payer: Self-pay | Admitting: Internal Medicine

## 2014-07-26 ENCOUNTER — Ambulatory Visit: Payer: Medicare Other | Admitting: Physician Assistant

## 2014-07-26 ENCOUNTER — Encounter: Payer: Self-pay | Admitting: Physician Assistant

## 2014-07-26 ENCOUNTER — Encounter: Payer: Self-pay | Admitting: Internal Medicine

## 2014-07-26 VITALS — BP 133/68 | HR 116 | Temp 98.2°F | Resp 18 | Ht 63.0 in | Wt 146.4 lb

## 2014-07-26 DIAGNOSIS — Z5111 Encounter for antineoplastic chemotherapy: Secondary | ICD-10-CM

## 2014-07-26 DIAGNOSIS — C3411 Malignant neoplasm of upper lobe, right bronchus or lung: Secondary | ICD-10-CM

## 2014-07-26 DIAGNOSIS — I639 Cerebral infarction, unspecified: Secondary | ICD-10-CM | POA: Diagnosis not present

## 2014-07-26 DIAGNOSIS — C3410 Malignant neoplasm of upper lobe, unspecified bronchus or lung: Secondary | ICD-10-CM

## 2014-07-26 LAB — CBC WITH DIFFERENTIAL/PLATELET
BASO%: 0.5 % (ref 0.0–2.0)
BASOS ABS: 0.1 10*3/uL (ref 0.0–0.1)
EOS%: 0.4 % (ref 0.0–7.0)
Eosinophils Absolute: 0 10*3/uL (ref 0.0–0.5)
HCT: 37.2 % (ref 34.8–46.6)
HGB: 12.2 g/dL (ref 11.6–15.9)
LYMPH%: 18.3 % (ref 14.0–49.7)
MCH: 29.3 pg (ref 25.1–34.0)
MCHC: 32.8 g/dL (ref 31.5–36.0)
MCV: 89.2 fL (ref 79.5–101.0)
MONO#: 1.1 10*3/uL — ABNORMAL HIGH (ref 0.1–0.9)
MONO%: 10.9 % (ref 0.0–14.0)
NEUT#: 6.9 10*3/uL — ABNORMAL HIGH (ref 1.5–6.5)
NEUT%: 69.9 % (ref 38.4–76.8)
PLATELETS: 451 10*3/uL — AB (ref 145–400)
RBC: 4.17 10*6/uL (ref 3.70–5.45)
RDW: 16.2 % — ABNORMAL HIGH (ref 11.2–14.5)
WBC: 9.8 10*3/uL (ref 3.9–10.3)
lymph#: 1.8 10*3/uL (ref 0.9–3.3)

## 2014-07-26 LAB — COMPREHENSIVE METABOLIC PANEL (CC13)
ALBUMIN: 3.6 g/dL (ref 3.5–5.0)
ALK PHOS: 81 U/L (ref 40–150)
ALT: 12 U/L (ref 0–55)
AST: 23 U/L (ref 5–34)
Anion Gap: 11 mEq/L (ref 3–11)
BUN: 11.5 mg/dL (ref 7.0–26.0)
CALCIUM: 10.4 mg/dL (ref 8.4–10.4)
CHLORIDE: 107 meq/L (ref 98–109)
CO2: 24 mEq/L (ref 22–29)
CREATININE: 0.9 mg/dL (ref 0.6–1.1)
EGFR: 65 mL/min/{1.73_m2} — ABNORMAL LOW (ref >90)
GLUCOSE: 132 mg/dL (ref 70–140)
POTASSIUM: 4.3 meq/L (ref 3.5–5.1)
SODIUM: 141 meq/L (ref 136–145)
Total Bilirubin: 0.43 mg/dL (ref 0.20–1.20)
Total Protein: 7.1 g/dL (ref 6.4–8.3)

## 2014-07-26 LAB — RESEARCH LABS

## 2014-07-26 MED ORDER — HEPARIN SOD (PORK) LOCK FLUSH 100 UNIT/ML IV SOLN
500.0000 [IU] | Freq: Once | INTRAVENOUS | Status: AC | PRN
  Administered 2014-07-26: 500 [IU]
  Filled 2014-07-26: qty 5

## 2014-07-26 MED ORDER — SODIUM CHLORIDE 0.9 % IJ SOLN
10.0000 mL | INTRAMUSCULAR | Status: DC | PRN
  Administered 2014-07-26: 10 mL
  Filled 2014-07-26: qty 10

## 2014-07-26 MED ORDER — SODIUM CHLORIDE 0.9 % IV SOLN
Freq: Once | INTRAVENOUS | Status: AC
  Administered 2014-07-26: 10:00:00 via INTRAVENOUS

## 2014-07-26 MED ORDER — CYANOCOBALAMIN 1000 MCG/ML IJ SOLN
1000.0000 ug | Freq: Once | INTRAMUSCULAR | Status: AC
  Administered 2014-07-26: 1000 ug via INTRAMUSCULAR

## 2014-07-26 MED ORDER — CYANOCOBALAMIN 1000 MCG/ML IJ SOLN
INTRAMUSCULAR | Status: AC
  Filled 2014-07-26: qty 1

## 2014-07-26 MED ORDER — SODIUM CHLORIDE 0.9 % IV SOLN
500.0000 mg/m2 | Freq: Once | INTRAVENOUS | Status: AC
  Administered 2014-07-26: 900 mg via INTRAVENOUS
  Filled 2014-07-26: qty 36

## 2014-07-26 MED ORDER — SODIUM CHLORIDE 0.9 % IV SOLN
Freq: Once | INTRAVENOUS | Status: AC
  Administered 2014-07-26: 11:00:00 via INTRAVENOUS
  Filled 2014-07-26: qty 4

## 2014-07-26 NOTE — Patient Instructions (Signed)
[facility] Discharge Instructions for Patients Receiving Chemotherapy  Today you received the following chemotherapy agents Alimta  To help prevent nausea and vomiting after your treatment, we encourage you to take your nausea medication   If you develop nausea and vomiting that is not controlled by your nausea medication, call the clinic.   BELOW ARE SYMPTOMS THAT SHOULD BE REPORTED IMMEDIATELY:  *FEVER GREATER THAN 100.5 F  *CHILLS WITH OR WITHOUT FEVER  NAUSEA AND VOMITING THAT IS NOT CONTROLLED WITH YOUR NAUSEA MEDICATION  *UNUSUAL SHORTNESS OF BREATH  *UNUSUAL BRUISING OR BLEEDING  TENDERNESS IN MOUTH AND THROAT WITH OR WITHOUT PRESENCE OF ULCERS  *URINARY PROBLEMS  *BOWEL PROBLEMS  UNUSUAL RASH Items with * indicate a potential emergency and should be followed up as soon as possible.  Feel free to call the clinic you have any questions or concerns. The clinic phone number is [phone].  Please show the CHEMO ALERT CARD at check-in to the Emergency Department and triage nurse.   

## 2014-07-26 NOTE — Telephone Encounter (Signed)
Gave patient avs report and appointments for August.  °

## 2014-07-26 NOTE — Progress Notes (Signed)
[facility]MS [id number] - questionnaires (PROs) - patient into the cancer center for routine visit. Patient was given PROs upon arrival to the cancer center. The patient completed her PROs (EQ-5D-3L first and then the LCSS booklet) before any study procedures were performed. I checked the PROs for completeness.  Research blood was drawn.  The patient was thanked for her continued support of this clinical trial. Barb Roberta Kelly 07/26/2014 8:05 AM

## 2014-07-26 NOTE — Progress Notes (Addendum)
[place] [phone]   [phone]  OFFICE PROGRESS NOTE  [name], MD [place] [address]  Diagnosis:  Metastatic non-small cell lung cancer initially diagnosed with locally advanced stage IIIB with right Pancoast tumor involving the vertebral body as well as the foraminal canal invasion with spinal cord compression in October of 2007.   Prior Therapy:  1. Status post concurrent chemoradiation with weekly carboplatin and paclitaxel, last dose was given November 18, 2005. 2. 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 3. Status post gamma knife radiotherapy to a solitary brain lesion located in the superior frontal area of the brain at [facility] in April of 2008. 4. Status post palliative radiotherapy to the lateral abdominal wall metastatic lesion under the care of Dr. Lisbeth Renshaw, completed March of 2009. 5. Status post 6 cycles of systemic chemotherapy with carboplatin and Alimta. Last dose was given July 26, 2007 with disease stabilization. 6. Gamma knife stereotactic radiotherapy to 2 brain lesions one involving the right frontal dural based as well as right parietal lesion performed on 05/07/2012 under the care of Dr. Vallarie Mare at [facility].  Current Therapy:  Maintenance chemotherapy with Alimta 500 mg per meter squared given every 3 weeks. The patient is status post 118 cycles.   CHEMOTHERAPY INTENT: Palliative/maintenance  CURRENT # OF CHEMOTHERAPY CYCLES: 119 CURRENT ANTIEMETICS: Zofran, dexamethasone and Compazine  CURRENT SMOKING STATUS: Quit smoking 10/07/2005  ORAL CHEMOTHERAPY AND CONSENT: None  CURRENT BISPHOSPHONATES USE: None  PAIN MANAGEMENT: 0/10 currently on morphine  NARCOTICS INDUCED CONSTIPATION: No constipation  LIVING WILL AND CODE STATUS: No CODE BLUE   INTERVAL HISTORY: Danielle [name] 64 y.o. female  returns to the clinic today for followup visit accompanied by her sister. She reports having some decreased appetite and losing a few pounds due to some increased stress with her children. She states that she will start drinking boost nutritional supplements again if she has gained weight with these in the past. She continues to tolerate her maintenance Alimta without difficulty. The patient is otherwise feeling well today with no other specific complaints. No significant fever or chills. She is tolerating her maintenance treatment with single agent Alimta fairly well with no significant adverse effects. The patient denied having any chest pain, shortness of breath, cough or hemoptysis. No current nausea or vomiting. The patient denied having any night sweats. She is here today to start cycle #120 of her maintenance chemotherapy.  MEDICAL HISTORY: Past Medical History  Diagnosis Date  . Hypertension   . Anemia   . H/O: pneumonia   . History of tobacco abuse quit 10/08    on nicotine patch  . Fibromyalgia   . Hypokalemia   . DJD (degenerative joint disease), cervical   . Thrush 12/11/2010  . 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 had chemo and radiation for lung ca  . CVA (cerebral vascular accident) 04/2014    pt states had 2 cva's within 2 wks    ALLERGIES:  is allergic to contrast media; iohexol; sulfa drugs cross reactors; and co-trimoxazole injection.  MEDICATIONS:  Current Outpatient Prescriptions  Medication Sig Dispense Refill  . ALPRAZolam (XANAX) 0.25 MG tablet Take 1 tablet (0.25 mg total) by mouth at bedtime as needed for sleep. 30 tablet 0  . amLODipine (NORVASC) 10 MG tablet TAKE 1 TABLET (10 MG TOTAL)  BY MOUTH DAILY. 90 tablet 1  . aspirin EC 325 MG tablet TAKE 1 TABLET (325 MG TOTAL) BY MOUTH DAILY. 30 tablet 0  . atorvastatin (LIPITOR) 40 MG tablet Take 1 tablet (40 mg total) by mouth daily at 6 PM. 90 tablet 3  . fluconazole (DIFLUCAN) 100 MG  tablet Take 1 tablet (100 mg total) by mouth daily. Do not take atorvastatin while taking fluconazole 10 tablet 1  . fluticasone (FLONASE) 50 MCG/ACT nasal spray USE 1-2 SPRAYS IN EACH NOSTRIL DAILY (Patient taking differently: USE 1-2 SPRAYS IN EACH NOSTRIL DAILY PRN CONGESTION) 16 g 11  . folic acid (FOLVITE) 1 MG tablet TAKE 1 TABLET BY MOUTH DAILY. 90 tablet 1  . lacosamide (VIMPAT) 50 MG TABS tablet Take 1 tablet (50 mg total) by mouth 2 (two) times daily. 60 tablet 0  . lidocaine-prilocaine (EMLA) cream Apply to Port-A-Cath as directed 30 g 1  . lisinopril (PRINIVIL,ZESTRIL) 20 MG tablet TAKE 2 TABLETS (40 MG TOTAL) BY MOUTH DAILY. 60 tablet 5  . morphine (MS CONTIN) 60 MG 12 hr tablet Take 1 tablet (60 mg total) by mouth 2 (two) times daily. 60 tablet 0  . morphine (MSIR) 30 MG tablet Take 1 tablet (30 mg total) by mouth every 4 (four) hours as needed (breakthrough pain). 60 tablet 0  . olopatadine (PATANOL) 0.1 % ophthalmic solution Place 1 drop into both eyes 2 (two) times daily. 5 mL 11  . ondansetron (ZOFRAN) 8 MG tablet Take 1 tablet (8 mg total) by mouth 2 (two) times daily as needed for nausea or vomiting. 30 tablet 2  . predniSONE (DELTASONE) 50 MG tablet Take 50 mg by mouth See admin instructions. Take 1 tablet (50 mg) 13 hours before CT scan, 7 hours before CT scan and 1 hour before CT scan (every 9 weeks)     No current facility-administered medications for this visit.    SURGICAL HISTORY:  Past Surgical History  Procedure Laterality Date  . Cervical laminectomy  1995  . Other surgical history  2008    Gamma knife surgery to remove brain met   . 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 Knee surgery  1990    Left x 2  . Portacath placement  -[name]    TIP IN CAVOATRIAL JUNCTION  . Loop recorder implant N/A 04/19/2014    Procedure: LOOP RECORDER IMPLANT;  Surgeon: Thompson Grayer, MD;  Location: [facility] CATH LAB;  Service: Cardiovascular;  Laterality:  N/A;  . Tee without cardioversion N/A 04/19/2014    Procedure: TRANSESOPHAGEAL ECHOCARDIOGRAM (TEE);  Surgeon: Larey Dresser, MD;  Location: [facility];  Service: Cardiovascular;  Laterality: N/A;    REVIEW OF SYSTEMS:  Constitutional: positive for anorexia, fatigue and weight loss Eyes: negative Ears, nose, mouth, throat, and face: negative Respiratory: negative Cardiovascular: negative Gastrointestinal: positive for nausea and vomiting Genitourinary:negative Integument/breast: negative Hematologic/lymphatic: negative Musculoskeletal:negative Neurological: negative Behavioral/Psych: negative Endocrine: negative Allergic/Immunologic: negative   PHYSICAL EXAMINATION: General appearance: alert, cooperative and no distress Head: Normocephalic, without obvious abnormality, atraumatic Neck: no adenopathy, no JVD, supple, symmetrical, trachea midline and thyroid not enlarged, symmetric, no tenderness/mass/nodules Lymph nodes: Cervical, supraclavicular, and axillary nodes normal. Resp: clear to auscultation bilaterally Back: symmetric, no curvature. ROM normal. No CVA tenderness. Cardio: regular rate and rhythm, S1, S2 normal, no murmur, click, rub or gallop GI: soft, non-tender; bowel sounds normal; no masses,  no organomegaly Extremities: extremities normal, atraumatic, no cyanosis  or edema Neurologic: Alert and oriented X 3, normal strength and tone. Normal symmetric reflexes. Normal coordination and gait Mouth: Clear  ECOG PERFORMANCE STATUS: 0 - Asymptomatic  Blood pressure 133/68, pulse 116, temperature 98.2 F (36.8 C), temperature source Oral, resp. rate 18, height '5\' 3"'  (1.6 m), weight 146 lb 6.4 oz (66.407 kg).  LABORATORY DATA: Lab Results  Component Value Date   WBC 9.8 07/26/2014   HGB 12.2 07/26/2014   HCT 37.2 07/26/2014   MCV 89.2 07/26/2014   PLT 451* 07/26/2014      Chemistry      Component Value Date/Time   NA 141 07/26/2014 0806   NA 135 04/26/2014  1205   K 4.3 07/26/2014 0806   K 4.2 04/26/2014 1205   CL 103 04/26/2014 1205   CL 106 06/30/2012 1004   CO2 24 07/26/2014 0806   CO2 29 04/26/2014 1205   BUN 11.5 07/26/2014 0806   BUN 17 04/26/2014 1205   CREATININE 0.9 07/26/2014 0806   CREATININE 0.67 04/26/2014 1205      Component Value Date/Time   CALCIUM 10.4 07/26/2014 0806   CALCIUM 9.5 04/26/2014 1205   ALKPHOS 81 07/26/2014 0806   ALKPHOS 57 04/26/2014 1205   AST 23 07/26/2014 0806   AST 15 04/26/2014 1205   ALT 12 07/26/2014 0806   ALT 10 04/26/2014 1205   BILITOT 0.43 07/26/2014 0806   BILITOT 0.4 04/26/2014 1205       RADIOGRAPHIC STUDIES: No results found. ASSESSMENT AND PLAN: This is a very pleasant 64 years old white female with metastatic non-small cell lung cancer currently undergoing maintenance chemotherapy with single agent Alimta status post 118 cycles. The patient is tolerating her treatment fairly well with no significant adverse effects.  Patient was discussed with and also seen by Dr. Julien Nordmann. She'll proceed with cycle #120 of her maintenance chemotherapy with single agent Alimta today as scheduled.  She will continue on her MS Contin and MSIR for pain management at the current doses. We are currently obtaining restaging CT scans every 6 cycles of maintenance chemotherapy. Today is cycle #5 since the last restaging CT scan. She will be due another restaging CT scan after 1 more cycles of maintenance chemotherapy. She will follow-up in 3 weeks prior to the start of cycle #121.  She was advised to call immediately if she has any concerning symptoms in the interval. The patient voices understanding of current disease status and treatment options and is in agreement with the current care plan.  All questions were answered. The patient knows to call the clinic with any problems, questions or concerns. We can certainly see the patient much sooner if necessary.  Carlton Adam,  PA-C 07/26/2014  ADDENDUM: Hematology/Oncology Attending: I had a face to face encounter with the patient. I recommended her care plan. This is a very pleasant 64 years old white female with metastatic non-small cell lung cancer who is currently undergoing maintenance systemic chemotherapy with single agent Alimta status post 119 cycles. The patient is tolerating her treatment well with no specific complaints. I recommended for her to proceed with cycle #120 today as a scheduled. She will come back for follow-up visit in 3 weeks for reevaluation before the next cycle of her treatment. For pain management the patient will continue on MS Contin and MSIR. She was advised to call immediately if she has any concerning symptoms in the interval.  Disclaimer: This note was dictated with voice recognition software. Similar sounding words  can inadvertently be transcribed and may be missed upon review. Eilleen Kempf., MD 07/31/2014

## 2014-07-28 NOTE — Patient Instructions (Signed)
Follow-up in 3 weeks prior to next scheduled cycle of chemotherapy

## 2014-07-29 ENCOUNTER — Encounter: Payer: Self-pay | Admitting: Family Medicine

## 2014-07-29 ENCOUNTER — Ambulatory Visit (INDEPENDENT_AMBULATORY_CARE_PROVIDER_SITE_OTHER): Payer: Medicare Other | Admitting: Family Medicine

## 2014-07-29 VITALS — BP 113/61 | HR 81 | Temp 97.7°F | Ht 63.0 in | Wt 149.5 lb

## 2014-07-29 DIAGNOSIS — E7800 Pure hypercholesterolemia, unspecified: Secondary | ICD-10-CM

## 2014-07-29 DIAGNOSIS — E119 Type 2 diabetes mellitus without complications: Secondary | ICD-10-CM | POA: Diagnosis not present

## 2014-07-29 DIAGNOSIS — I639 Cerebral infarction, unspecified: Secondary | ICD-10-CM

## 2014-07-29 DIAGNOSIS — I10 Essential (primary) hypertension: Secondary | ICD-10-CM | POA: Diagnosis not present

## 2014-07-29 MED ORDER — OLOPATADINE HCL 0.1 % OP SOLN: 1.0000 [drp] | Freq: Two times a day (BID) | OPHTHALMIC | Status: DC

## 2014-07-29 MED ORDER — OLOPATADINE HCL 0.2 % OP SOLN: 1.0000 [drp] | Freq: Every day | OPHTHALMIC | Status: DC

## 2014-07-29 NOTE — Assessment & Plan Note (Signed)
Due for re-eval on higher dose of lipitor. New goal post CVA LDL < 70.

## 2014-07-29 NOTE — Assessment & Plan Note (Signed)
Was low last week, otherwise normal.  Occ lightheadedness. Pt will follow BPs at home. Consider decreasing BP meds if low persistently.

## 2014-07-29 NOTE — Progress Notes (Signed)
64 year old female with metastatic lung cancer presents for 3 months DM follow up.  On treatment #120.  She wa admitted from April 9 to 12 2016 after ER visit for slurred speech and confusion. 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 Although improved,continued to have nonsensical speech at times.Transthoracic echocardiogram and trans-esophageal Echo negative for a embolic etiology. Carotid Doppler did not show significant stenosis. 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  She has continued to improve with speech.  Diabetes:   Improved control on no med. Lab Results  Component Value Date   HGBA1C 6.0 07/25/2014  Using medications without difficulties: on none Hypoglycemic episodes: not chekcing Hyperglycemic episodes: not checking Feet problems: no ulcers Blood Sugars averaging: ? eye exam within last year:due   Elevated Cholesterol:Lipitor was increased to 40 mg daily. Due for re-eval of LDl.. Was not at new CVA goal < 70 in 04/2014    Lab Results  Component Value Date   CHOL 173 04/18/2014   HDL 45 04/18/2014   LDLCALC 105* 04/18/2014   LDLDIRECT 105.5 08/06/2013   TRIG 116 04/18/2014   CHOLHDL 3.8 04/18/2014  Using medications without problems: no SE. Muscle aches:  None Minimal exercise, good diet. Other complaints:  Hypertension:   At goal on amlodipine, lisinopril  BP Readings from Last 3 Encounters:  07/29/14 113/61  07/26/14 133/68  07/22/14 99/66  Using medication without problems or lightheadedness:  Occ Chest pain with exertion: None Edema: None Short of breath: Stable. Average home BPs: Other issues:  Review of Systems  Constitutional: Negative for fever and fatigue.  HENT: Negative for ear pain.  Eyes: Negative for pain.  Respiratory: Negative for chest tightness and shortness of breath.  Cardiovascular: Negative for chest pain, palpitations  and leg swelling.  Gastrointestinal: Negative for abdominal pain.  Genitourinary: Negative for dysuria.       Objective:   Physical Exam  Constitutional: Vital signs are normal. She appears well-developed and well-nourished. She is cooperative. Non-toxic appearance. She does not appear ill. No distress.  HENT:  Head: Normocephalic.  Right Ear: Hearing, tympanic membrane, external ear and ear canal normal. Tympanic membrane is not erythematous, not retracted and not bulging.  Left Ear: Hearing, tympanic membrane, external ear and ear canal normal. Tympanic membrane is not erythematous, not retracted and not bulging.  Nose: No mucosal edema or rhinorrhea. Right sinus exhibits no maxillary sinus tenderness and no frontal sinus tenderness. Left sinus exhibits no maxillary sinus tenderness and no frontal sinus tenderness.  Mouth/Throat: Uvula is midline, oropharynx is clear and moist and mucous membranes are normal.  Eyes: Conjunctivae, EOM and lids are normal. Pupils are equal, round, and reactive to light. Lids are everted and swept, no foreign bodies found.  Neck: Trachea normal and normal range of motion. Neck supple. Carotid bruit is not present. No thyroid mass and no thyromegaly present.  Cardiovascular: Normal rate, regular rhythm, S1 normal, S2 normal, normal heart sounds, intact distal pulses and normal pulses. Exam reveals no gallop and no friction rub.  No murmur heard. Pulmonary/Chest: Effort normal. No tachypnea. No respiratory distress. She has decreased breath sounds. She has no wheezes. She has no rhonchi. She has no rales.  Unchanged from previous exam  Abdominal: Soft. Normal appearance and bowel sounds are normal. There is no tenderness.  Neurological: She is alert.  Skin: Skin is warm, dry and intact. No rash noted.  Psychiatric: Her speech is normal and behavior is normal. Judgment and thought content normal. Her mood appears not anxious. Cognition and memory are  normal. She does not exhibit a depressed mood.   Diabetic foot exam: Normal inspection No skin breakdown No calluses  Normal DP pulses Normal sensation to light touch and monofilament Nails normal

## 2014-07-29 NOTE — Assessment & Plan Note (Signed)
Improved control on diet control. Encouraged exercise, weight loss, healthy eating habits.

## 2014-07-29 NOTE — Progress Notes (Signed)
Pre visit review using our clinic review tool, if applicable. No additional management support is needed unless otherwise documented below in the visit note. 

## 2014-07-29 NOTE — Addendum Note (Signed)
Addended by: Carter Kitten on: 07/29/2014 11:55 AM   Modules accepted: Orders

## 2014-07-29 NOTE — Patient Instructions (Addendum)
Schedule eye exam  When able.  Have cholesterol checked prior to next chem with routine labs.   Follow up blood at home. Call if lightheaded or low BPs < 100/60.

## 2014-08-01 ENCOUNTER — Ambulatory Visit (INDEPENDENT_AMBULATORY_CARE_PROVIDER_SITE_OTHER): Payer: Medicare Other | Admitting: Neurology

## 2014-08-01 DIAGNOSIS — R4701 Aphasia: Secondary | ICD-10-CM | POA: Diagnosis not present

## 2014-08-01 DIAGNOSIS — I63412 Cerebral infarction due to embolism of left middle cerebral artery: Secondary | ICD-10-CM

## 2014-08-01 NOTE — Procedures (Signed)
    History:  Danielle [name] is a 64 year old patient with a history of lung cancer metastatic to brain. The patient was admitted on 04/16/2014 with aphasia, found to have a left frontal hemorrhagic stroke and right punctate parietal infarcts. EEG evaluation at that time shows evidence of left frontal irritability, with what was felt to be an electrographic seizure. The patient is being reevaluated for this issue.  This is a routine EEG. No skull defects are noted. Medications include alprazolam, Norvasc, aspirin, Lipitor, Flonase, folic acid, Vimpat, lisinopril, MS Contin, Zofran, and prednisone.   EEG classification: Normal awake  Description of the recording: The background rhythms of this recording consists of a fairly well modulated medium amplitude alpha rhythm of 9 Hz that is reactive to eye opening and closure. As the record progresses, the patient appears to remain in the waking state throughout the recording. Photic stimulation was not performed. Hyperventilation was performed, resulting in a minimal buildup of the background rhythm activities without significant slowing seen. At no time during the recording does there appear to be evidence of spike or spike wave discharges or evidence of focal slowing. EKG monitor shows no evidence of cardiac rhythm abnormalities with a heart rate of 84.  Impression: This is a normal EEG recording in the waking state. No evidence of ictal or interictal discharges are seen.

## 2014-08-03 ENCOUNTER — Telehealth: Payer: Self-pay

## 2014-08-03 ENCOUNTER — Telehealth: Payer: Self-pay | Admitting: *Deleted

## 2014-08-03 ENCOUNTER — Telehealth: Payer: Self-pay | Admitting: Neurology

## 2014-08-03 DIAGNOSIS — C3410 Malignant neoplasm of upper lobe, unspecified bronchus or lung: Secondary | ICD-10-CM

## 2014-08-03 MED ORDER — ALPRAZOLAM 0.25 MG PO TABS: 0.2500 mg | ORAL_TABLET | Freq: Every evening | ORAL | Status: DC | PRN

## 2014-08-03 NOTE — Telephone Encounter (Signed)
Spoke to to daughter-in-law. Gave results. Daughter-in-law and son state they would like MD to know without letting the patient know that she is not "doing fine" as she says she is during the office visits. They report patient is "hard to handle" most of the time, she has dizzy spells, and they feel she should definitely not be driving. Daughter in-law Larence Penning takes care of patient daily and she states she cannot communicate this to the MD during visits because the patient will get upset. Advised would let MD know.

## 2014-08-03 NOTE — Telephone Encounter (Signed)
Spoke to patient's son. Unable to release results b/c son not on DPR. Son verbalized understanding. He said he will have his wife Larence Penning to call.

## 2014-08-03 NOTE — Telephone Encounter (Signed)
'  I need my Alprazolam refilled. I use [facility] in [place]."  Xanax 0.25 mg take one at bedtime number thirty was ordered last on 06-14-2014.

## 2014-08-03 NOTE — Telephone Encounter (Signed)
-----   Message from Hope Pigeon, RN sent at 08/03/2014  8:46 AM EDT -----   ----- Message -----    From: Rosalin Hawking, MD    Sent: 08/02/2014   5:50 PM      To: [place], please let the patient note that her EEG done in our office was normal. Continue current treatment plan. Thanks.  Rosalin Hawking, MD PhD Stroke Neurology 08/02/2014 5:49 PM

## 2014-08-03 NOTE — Telephone Encounter (Signed)
Spoke to patient. Gave results. Patient verbalized understanding. Patient stated she will be driving now.  Advised would let Dr. Erlinda Hong know.

## 2014-08-03 NOTE — Telephone Encounter (Signed)
-----   Message from Hope Pigeon, RN sent at 08/03/2014  8:46 AM EDT -----   ----- Message -----    From: Rosalin Hawking, MD    Sent: 08/02/2014   5:50 PM      To: [place], please let the patient note that her EEG done in our office was normal. Continue current treatment plan. Thanks.  Rosalin Hawking, MD PhD Stroke Neurology 08/02/2014 5:49 PM

## 2014-08-03 NOTE — Telephone Encounter (Signed)
Pt called and wants to know her EEG results. Please call and advise [phone]

## 2014-08-03 NOTE — Telephone Encounter (Signed)
Noted. Thank you for letting me know. Her BP was low during visit. Not sure what her BP is at home. I have asked pt to check BP at home. Could you please ask what her BP recording at home? If still low, she should call her PCP for medication adjustment.   Rosalin Hawking, MD PhD Stroke Neurology 08/03/2014 5:09 PM

## 2014-08-04 NOTE — Telephone Encounter (Signed)
Thanks much, Casandra.  Rosalin Hawking, MD PhD Stroke Neurology 08/04/2014 5:11 PM

## 2014-08-04 NOTE — Telephone Encounter (Signed)
Spoke to patient. She said she went to her PCP last week and her BP has been straightened out. She states her readings are good now.

## 2014-08-07 ENCOUNTER — Other Ambulatory Visit: Payer: Self-pay | Admitting: Internal Medicine

## 2014-08-07 DIAGNOSIS — C3490 Malignant neoplasm of unspecified part of unspecified bronchus or lung: Secondary | ICD-10-CM

## 2014-08-10 ENCOUNTER — Other Ambulatory Visit: Payer: Self-pay | Admitting: Internal Medicine

## 2014-08-16 ENCOUNTER — Encounter: Payer: Self-pay | Admitting: Internal Medicine

## 2014-08-16 ENCOUNTER — Ambulatory Visit (HOSPITAL_BASED_OUTPATIENT_CLINIC_OR_DEPARTMENT_OTHER): Payer: Medicare Other

## 2014-08-16 ENCOUNTER — Ambulatory Visit (HOSPITAL_BASED_OUTPATIENT_CLINIC_OR_DEPARTMENT_OTHER): Payer: Medicare Other | Admitting: Internal Medicine

## 2014-08-16 ENCOUNTER — Other Ambulatory Visit (HOSPITAL_BASED_OUTPATIENT_CLINIC_OR_DEPARTMENT_OTHER): Payer: Medicare Other

## 2014-08-16 VITALS — BP 119/53 | HR 88 | Temp 98.5°F | Resp 18 | Ht 63.0 in | Wt 149.5 lb

## 2014-08-16 DIAGNOSIS — C3490 Malignant neoplasm of unspecified part of unspecified bronchus or lung: Secondary | ICD-10-CM | POA: Diagnosis not present

## 2014-08-16 DIAGNOSIS — E785 Hyperlipidemia, unspecified: Secondary | ICD-10-CM | POA: Diagnosis not present

## 2014-08-16 DIAGNOSIS — C3411 Malignant neoplasm of upper lobe, right bronchus or lung: Secondary | ICD-10-CM | POA: Diagnosis not present

## 2014-08-16 DIAGNOSIS — G893 Neoplasm related pain (acute) (chronic): Secondary | ICD-10-CM | POA: Diagnosis not present

## 2014-08-16 DIAGNOSIS — R600 Localized edema: Secondary | ICD-10-CM

## 2014-08-16 DIAGNOSIS — Z5111 Encounter for antineoplastic chemotherapy: Secondary | ICD-10-CM | POA: Diagnosis not present

## 2014-08-16 LAB — CBC WITH DIFFERENTIAL/PLATELET
BASO%: 0.5 % (ref 0.0–2.0)
Basophils Absolute: 0.1 10*3/uL (ref 0.0–0.1)
EOS%: 0.9 % (ref 0.0–7.0)
Eosinophils Absolute: 0.1 10*3/uL (ref 0.0–0.5)
HEMATOCRIT: 33.6 % — AB (ref 34.8–46.6)
HEMOGLOBIN: 10.7 g/dL — AB (ref 11.6–15.9)
LYMPH%: 22.5 % (ref 14.0–49.7)
MCH: 28.5 pg (ref 25.1–34.0)
MCHC: 31.9 g/dL (ref 31.5–36.0)
MCV: 89.4 fL (ref 79.5–101.0)
MONO#: 1.5 10*3/uL — AB (ref 0.1–0.9)
MONO%: 13.1 % (ref 0.0–14.0)
NEUT#: 7.1 10*3/uL — ABNORMAL HIGH (ref 1.5–6.5)
NEUT%: 63 % (ref 38.4–76.8)
Platelets: 502 10*3/uL — ABNORMAL HIGH (ref 145–400)
RBC: 3.76 10*6/uL (ref 3.70–5.45)
RDW: 16.2 % — ABNORMAL HIGH (ref 11.2–14.5)
WBC: 11.3 10*3/uL — ABNORMAL HIGH (ref 3.9–10.3)
lymph#: 2.5 10*3/uL (ref 0.9–3.3)

## 2014-08-16 LAB — COMPREHENSIVE METABOLIC PANEL (CC13)
ALK PHOS: 80 U/L (ref 40–150)
ALT: 16 U/L (ref 0–55)
ANION GAP: 7 meq/L (ref 3–11)
AST: 19 U/L (ref 5–34)
Albumin: 3.3 g/dL — ABNORMAL LOW (ref 3.5–5.0)
BILIRUBIN TOTAL: 0.31 mg/dL (ref 0.20–1.20)
BUN: 19.3 mg/dL (ref 7.0–26.0)
CO2: 26 meq/L (ref 22–29)
CREATININE: 0.9 mg/dL (ref 0.6–1.1)
Calcium: 9.5 mg/dL (ref 8.4–10.4)
Chloride: 109 mEq/L (ref 98–109)
EGFR: 65 mL/min/{1.73_m2} — ABNORMAL LOW (ref >90)
GLUCOSE: 115 mg/dL (ref 70–140)
POTASSIUM: 4.7 meq/L (ref 3.5–5.1)
Sodium: 141 mEq/L (ref 136–145)
Total Protein: 6.6 g/dL (ref 6.4–8.3)

## 2014-08-16 LAB — CUP PACEART REMOTE DEVICE CHECK: Date Time Interrogation Session: 20160809121357

## 2014-08-16 MED ORDER — SODIUM CHLORIDE 0.9 % IJ SOLN
10.0000 mL | INTRAMUSCULAR | Status: DC | PRN
  Administered 2014-08-16: 10 mL
  Filled 2014-08-16: qty 10

## 2014-08-16 MED ORDER — SODIUM CHLORIDE 0.9 % IV SOLN
Freq: Once | INTRAVENOUS | Status: AC
  Administered 2014-08-16: 13:00:00 via INTRAVENOUS
  Filled 2014-08-16: qty 4

## 2014-08-16 MED ORDER — HEPARIN SOD (PORK) LOCK FLUSH 100 UNIT/ML IV SOLN
500.0000 [IU] | Freq: Once | INTRAVENOUS | Status: AC | PRN
  Administered 2014-08-16: 500 [IU]
  Filled 2014-08-16: qty 5

## 2014-08-16 MED ORDER — SODIUM CHLORIDE 0.9 % IV SOLN
Freq: Once | INTRAVENOUS | Status: AC
  Administered 2014-08-16: 13:00:00 via INTRAVENOUS

## 2014-08-16 MED ORDER — SODIUM CHLORIDE 0.9 % IV SOLN
500.0000 mg/m2 | Freq: Once | INTRAVENOUS | Status: AC
  Administered 2014-08-16: 900 mg via INTRAVENOUS
  Filled 2014-08-16: qty 36

## 2014-08-16 NOTE — Patient Instructions (Signed)
Pemetrexed injection What is this medicine? PEMETREXED (PEM e TREX ed) is a chemotherapy drug. This medicine affects cells that are rapidly growing, such as cancer cells and cells in your mouth and stomach. It is usually used to treat lung cancers like non-small cell lung cancer and mesothelioma. It may also be used to treat other cancers. This medicine may be used for other purposes; ask your health care provider or pharmacist if you have questions. COMMON BRAND NAME(S): Alimta What should I tell my health care provider before I take this medicine? They need to know if you have any of these conditions: -if you frequently drink alcohol containing beverages -infection (especially a virus infection such as chickenpox, cold sores, or herpes) -kidney disease -liver disease -low blood counts, like low platelets, red bloods, or white blood cells -an unusual or allergic reaction to pemetrexed, mannitol, other medicines, foods, dyes, or preservatives -pregnant or trying to get pregnant -breast-feeding How should I use this medicine? This drug is given as an infusion into a vein. It is administered in a hospital or clinic by a specially trained health care professional. Talk to your pediatrician regarding the use of this medicine in children. Special care may be needed. Overdosage: If you think you have taken too much of this medicine contact a poison control center or emergency room at once. NOTE: This medicine is only for you. Do not share this medicine with others. What if I miss a dose? It is important not to miss your dose. Call your doctor or health care professional if you are unable to keep an appointment. What may interact with this medicine? -aspirin and aspirin-like medicines -medicines to increase blood counts like filgrastim, pegfilgrastim, sargramostim -methotrexate -NSAIDS, medicines for pain and inflammation, like ibuprofen or naproxen -probenecid -pyrimethamine -vaccines Talk to  your doctor or health care professional before taking any of these medicines: -acetaminophen -aspirin -ibuprofen -ketoprofen -naproxen This list may not describe all possible interactions. Give your health care provider a list of all the medicines, herbs, non-prescription drugs, or dietary supplements you use. Also tell them if you smoke, drink alcohol, or use illegal drugs. Some items may interact with your medicine. What should I watch for while using this medicine? Visit your doctor for checks on your progress. This drug may make you feel generally unwell. This is not uncommon, as chemotherapy can affect healthy cells as well as cancer cells. Report any side effects. Continue your course of treatment even though you feel ill unless your doctor tells you to stop. In some cases, you may be given additional medicines to help with side effects. Follow all directions for their use. Call your doctor or health care professional for advice if you get a fever, chills or sore throat, or other symptoms of a cold or flu. Do not treat yourself. This drug decreases your body's ability to fight infections. Try to avoid being around people who are sick. This medicine may increase your risk to bruise or bleed. Call your doctor or health care professional if you notice any unusual bleeding. Be careful brushing and flossing your teeth or using a toothpick because you may get an infection or bleed more easily. If you have any dental work done, tell your dentist you are receiving this medicine. Avoid taking products that contain aspirin, acetaminophen, ibuprofen, naproxen, or ketoprofen unless instructed by your doctor. These medicines may hide a fever. Call your doctor or health care professional if you get diarrhea or mouth sores. Do not treat   yourself. To protect your kidneys, drink water or other fluids as directed while you are taking this medicine. Men and women must use effective birth control while taking this  medicine. You may also need to continue using effective birth control for a time after stopping this medicine. Do not become pregnant while taking this medicine. Tell your doctor right away if you think that you or your partner might be pregnant. There is a potential for serious side effects to an unborn child. Talk to your health care professional or pharmacist for more information. Do not breast-feed an infant while taking this medicine. This medicine may lower sperm counts. What side effects may I notice from receiving this medicine? Side effects that you should report to your doctor or health care professional as soon as possible: -allergic reactions like skin rash, itching or hives, swelling of the face, lips, or tongue -low blood counts - this medicine may decrease the number of white blood cells, red blood cells and platelets. You may be at increased risk for infections and bleeding. -signs of infection - fever or chills, cough, sore throat, pain or difficulty passing urine -signs of decreased platelets or bleeding - bruising, pinpoint red spots on the skin, black, tarry stools, blood in the urine -signs of decreased red blood cells - unusually weak or tired, fainting spells, lightheadedness -breathing problems, like a dry cough -changes in emotions or moods -chest pain -confusion -diarrhea -high blood pressure -mouth or throat sores or ulcers -pain, swelling, warmth in the leg -pain on swallowing -swelling of the ankles, feet, hands -trouble passing urine or change in the amount of urine -vomiting -yellowing of the eyes or skin Side effects that usually do not require medical attention (report to your doctor or health care professional if they continue or are bothersome): -hair loss -loss of appetite -nausea -stomach upset This list may not describe all possible side effects. Call your doctor for medical advice about side effects. You may report side effects to FDA at  1-800-FDA-1088. Where should I keep my medicine? This drug is given in a hospital or clinic and will not be stored at home. NOTE: This sheet is a summary. It may not cover all possible information. If you have questions about this medicine, talk to your doctor, pharmacist, or health care provider.  2015, [employer]vier/Gold Standard. (2007-07-28 13:24:03)  

## 2014-08-16 NOTE — Progress Notes (Signed)
[facility] [phone]   [phone]  OFFICE PROGRESS NOTE  [name], MD [place] [address]  Diagnosis:  Metastatic non-small cell lung cancer initially diagnosed with locally advanced stage IIIB with right Pancoast tumor involving the vertebral body as well as the foraminal canal invasion with spinal cord compression in October of 2007.   Prior Therapy:  1. Status post concurrent chemoradiation with weekly carboplatin and paclitaxel, last dose was given November 18, 2005. 2. 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 3. Status post gamma knife radiotherapy to a solitary brain lesion located in the superior frontal area of the brain at [facility] in April of 2008. 4. Status post palliative radiotherapy to the lateral abdominal wall metastatic lesion under the care of Dr. Lisbeth Renshaw, completed March of 2009. 5. Status post 6 cycles of systemic chemotherapy with carboplatin and Alimta. Last dose was given July 26, 2007 with disease stabilization. 6. Gamma knife stereotactic radiotherapy to 2 brain lesions one involving the right frontal dural based as well as right parietal lesion performed on 05/07/2012 under the care of Dr. Vallarie Mare at [facility].  Current Therapy:  Maintenance chemotherapy with Alimta 500 mg per meter squared given every 3 weeks. The patient is status post 120 cycles.   CHEMOTHERAPY INTENT: Palliative/maintenance  CURRENT # OF CHEMOTHERAPY CYCLES: 121 CURRENT ANTIEMETICS: Zofran, dexamethasone and Compazine  CURRENT SMOKING STATUS: Quit smoking 10/07/2005  ORAL CHEMOTHERAPY AND CONSENT: None  CURRENT BISPHOSPHONATES USE: None  PAIN MANAGEMENT: 0/10 currently on morphine  NARCOTICS INDUCED CONSTIPATION: No constipation  LIVING WILL AND CODE STATUS: No CODE BLUE   INTERVAL HISTORY: Danielle [name] 64 y.o. female  returns to the clinic today for followup visit accompanied by her sister. The patient is feeling fine today with no specific complaints. She has mild swelling in her lower extremities but that improved after she elevates her leg by the end of the day. No significant fever or chills. She is tolerating her maintenance treatment with single agent Alimta fairly well with no significant adverse effects. The patient denied having any chest pain, shortness of breath, cough or hemoptysis. No nausea or vomiting. The patient denied having any weight loss or night sweats. She is here today to start cycle #121 her chemotherapy.  MEDICAL HISTORY: Past Medical History  Diagnosis Date  . Hypertension   . Anemia   . H/O: pneumonia   . History of tobacco abuse quit 10/08    on nicotine patch  . Fibromyalgia   . Hypokalemia   . DJD (degenerative joint disease), cervical   . Thrush 12/11/2010  . 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 had chemo and radiation for lung ca  . CVA (cerebral vascular accident) 04/2014    pt states had 2 cva's within 2 wks    ALLERGIES:  is allergic to contrast media; iohexol; sulfa drugs cross reactors; and co-trimoxazole injection.  MEDICATIONS:  Current Outpatient Prescriptions  Medication Sig Dispense Refill  . ALPRAZolam (XANAX) 0.25 MG tablet Take 1 tablet (0.25 mg total) by mouth at bedtime as needed for sleep. 30 tablet 0  . amLODipine (NORVASC) 10 MG tablet TAKE 1 TABLET (10 MG TOTAL) BY MOUTH DAILY. 90 tablet 1  . aspirin EC 325 MG tablet TAKE 1 TABLET (325 MG TOTAL) BY MOUTH DAILY. 30 tablet 0  . atorvastatin (LIPITOR)  40 MG tablet Take 1 tablet (40 mg total) by mouth daily at 6 PM. 90 tablet 3  . fluticasone (FLONASE) 50 MCG/ACT nasal spray USE 1-2 SPRAYS IN EACH NOSTRIL DAILY (Patient taking differently: USE 1-2 SPRAYS IN EACH NOSTRIL DAILY PRN CONGESTION) 16 g 11  . folic acid (FOLVITE) 1 MG tablet TAKE 1 TABLET BY MOUTH DAILY. 90  tablet 1  . lacosamide (VIMPAT) 50 MG TABS tablet Take 1 tablet (50 mg total) by mouth 2 (two) times daily. 60 tablet 0  . lidocaine-prilocaine (EMLA) cream Apply to Port-A-Cath as directed 30 g 1  . lisinopril (PRINIVIL,ZESTRIL) 20 MG tablet TAKE 2 TABLETS (40 MG TOTAL) BY MOUTH DAILY. 60 tablet 5  . morphine (MS CONTIN) 60 MG 12 hr tablet Take 1 tablet (60 mg total) by mouth 2 (two) times daily. 60 tablet 0  . morphine (MSIR) 30 MG tablet Take 1 tablet (30 mg total) by mouth every 4 (four) hours as needed (breakthrough pain). 60 tablet 0  . Olopatadine HCl 0.2 % SOLN Apply 1 drop to eye daily. 2.5 mL 11  . ondansetron (ZOFRAN) 8 MG tablet Take 1 tablet (8 mg total) by mouth 2 (two) times daily as needed for nausea or vomiting. 30 tablet 2  . predniSONE (DELTASONE) 50 MG tablet Take 50 mg by mouth See admin instructions. Take 1 tablet (50 mg) 13 hours before CT scan, 7 hours before CT scan and 1 hour before CT scan (every 9 weeks)     No current facility-administered medications for this visit.    SURGICAL HISTORY:  Past Surgical History  Procedure Laterality Date  . Cervical laminectomy  1995  . Other surgical history  2008    Gamma knife surgery to remove brain met   . 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 Knee surgery  1990    Left x 2  . Portacath placement  -[name]    TIP IN CAVOATRIAL JUNCTION  . Loop recorder implant N/A 04/19/2014    Procedure: LOOP RECORDER IMPLANT;  Surgeon: Thompson Grayer, MD;  Location: [facility] CATH LAB;  Service: Cardiovascular;  Laterality: N/A;  . Tee without cardioversion N/A 04/19/2014    Procedure: TRANSESOPHAGEAL ECHOCARDIOGRAM (TEE);  Surgeon: Larey Dresser, MD;  Location: [facility];  Service: Cardiovascular;  Laterality: N/A;    REVIEW OF SYSTEMS:  Constitutional: negative Eyes: negative Ears, nose, mouth, throat, and face: negative Respiratory: negative Cardiovascular: negative Gastrointestinal:  negative Genitourinary:negative Integument/breast: negative Hematologic/lymphatic: negative Musculoskeletal:negative Neurological: negative Behavioral/Psych: negative Endocrine: negative Allergic/Immunologic: negative   PHYSICAL EXAMINATION: General appearance: alert, cooperative and no distress Head: Normocephalic, without obvious abnormality, atraumatic Neck: no adenopathy, no JVD, supple, symmetrical, trachea midline and thyroid not enlarged, symmetric, no tenderness/mass/nodules Lymph nodes: Cervical, supraclavicular, and axillary nodes normal. Resp: clear to auscultation bilaterally Back: symmetric, no curvature. ROM normal. No CVA tenderness. Cardio: regular rate and rhythm, S1, S2 normal, no murmur, click, rub or gallop GI: soft, non-tender; bowel sounds normal; no masses,  no organomegaly Extremities: extremities normal, atraumatic, no cyanosis or edema Neurologic: Alert and oriented X 3, normal strength and tone. Normal symmetric reflexes. Normal coordination and gait  ECOG PERFORMANCE STATUS: 0 - Asymptomatic  Blood pressure 119/53, pulse 88, temperature 98.5 F (36.9 C), temperature source Oral, resp. rate 18, height '5\' 3"'  (1.6 m), weight 149 lb 8 oz (67.813 kg), SpO2 97 %.  LABORATORY DATA: Lab Results  Component Value Date   WBC  11.3* 08/16/2014   HGB 10.7* 08/16/2014   HCT 33.6* 08/16/2014   MCV 89.4 08/16/2014   PLT 502* 08/16/2014      Chemistry      Component Value Date/Time   NA 141 08/16/2014 1040   NA 135 04/26/2014 1205   K 4.7 08/16/2014 1040   K 4.2 04/26/2014 1205   CL 103 04/26/2014 1205   CL 106 06/30/2012 1004   CO2 26 08/16/2014 1040   CO2 29 04/26/2014 1205   BUN 19.3 08/16/2014 1040   BUN 17 04/26/2014 1205   CREATININE 0.9 08/16/2014 1040   CREATININE 0.67 04/26/2014 1205      Component Value Date/Time   CALCIUM 9.5 08/16/2014 1040   CALCIUM 9.5 04/26/2014 1205   ALKPHOS 80 08/16/2014 1040   ALKPHOS 57 04/26/2014 1205   AST 19  08/16/2014 1040   AST 15 04/26/2014 1205   ALT 16 08/16/2014 1040   ALT 10 04/26/2014 1205   BILITOT 0.31 08/16/2014 1040   BILITOT 0.4 04/26/2014 1205       RADIOGRAPHIC STUDIES: No results found. ASSESSMENT AND PLAN: This is a very pleasant 63 years old white female with metastatic non-small cell lung cancer currently undergoing maintenance chemotherapy with single agent Alimta status post 120 cycles. The patient is tolerating her treatment fairly well with no significant adverse effects.  I recommended for the patient to proceed with her systemic therapy today as scheduled. She will receive cycle #121 today. The patient would come back for followup visit in 3 weeks for evaluation before starting cycle #122 after repeating CT scan of the chest, abdomen and pelvis for restaging of her disease. For pain management, she will continue on her current pain medication with MS Contin and MS IR.  For the swelling of the lower extremity, we will continue to monitor this closely and the patient will continue to elevate her legs frequently. I will consider her for treatment with Lasix 20 mg by mouth as needed if the swelling continues She was advised to call immediately if she has any concerning symptoms in the interval. The patient voices understanding of current disease status and treatment options and is in agreement with the current care plan.  All questions were answered. The patient knows to call the clinic with any problems, questions or concerns. We can certainly see the patient much sooner if necessary.  Disclaimer: This note was dictated with voice recognition software. Similar sounding words can inadvertently be transcribed and may not be corrected upon review.

## 2014-08-17 ENCOUNTER — Ambulatory Visit (INDEPENDENT_AMBULATORY_CARE_PROVIDER_SITE_OTHER): Payer: Medicare Other | Admitting: *Deleted

## 2014-08-17 DIAGNOSIS — I639 Cerebral infarction, unspecified: Secondary | ICD-10-CM

## 2014-08-18 NOTE — Progress Notes (Signed)
Loop recorder 

## 2014-08-19 ENCOUNTER — Telehealth: Payer: Self-pay | Admitting: *Deleted

## 2014-08-19 LAB — CUP PACEART REMOTE DEVICE CHECK: MDC IDC SESS DTM: 20160812132749

## 2014-08-19 NOTE — Telephone Encounter (Signed)
VM message received from patient requesting refill on her Morphine 30 mg ER (last filled on 07/22/14), Morphine 60 mg tabs (last filled 07/05/14) and her Xanax (last filled 08/03/14) She states she can come on Monday to pick up prescriptions. Please call pt when scripts are ready. Thanks.

## 2014-08-20 NOTE — Telephone Encounter (Signed)
Ok to refill 

## 2014-08-22 ENCOUNTER — Other Ambulatory Visit: Payer: Self-pay | Admitting: Medical Oncology

## 2014-08-22 ENCOUNTER — Telehealth: Payer: Self-pay | Admitting: Medical Oncology

## 2014-08-22 DIAGNOSIS — C3490 Malignant neoplasm of unspecified part of unspecified bronchus or lung: Secondary | ICD-10-CM

## 2014-08-22 DIAGNOSIS — C3410 Malignant neoplasm of upper lobe, unspecified bronchus or lung: Secondary | ICD-10-CM

## 2014-08-22 MED ORDER — MORPHINE SULFATE ER 60 MG PO TBCR: 60.0000 mg | EXTENDED_RELEASE_TABLET | Freq: Two times a day (BID) | ORAL | Status: DC

## 2014-08-22 MED ORDER — MORPHINE SULFATE 30 MG PO TABS: 30.0000 mg | ORAL_TABLET | ORAL | Status: DC | PRN

## 2014-08-22 NOTE — Telephone Encounter (Signed)
Left foot and ankle swelling no different since "Dr Danielle [name] saw it". Per Danielle [name] he ordered US doppler

## 2014-08-23 ENCOUNTER — Other Ambulatory Visit: Payer: Self-pay | Admitting: Internal Medicine

## 2014-08-23 ENCOUNTER — Ambulatory Visit (HOSPITAL_COMMUNITY)
Admission: RE | Admit: 2014-08-23 | Discharge: 2014-08-23 | Disposition: A | Discharge status: Home / Self Care | Payer: Medicare Other | Source: Ambulatory Visit | Attending: Internal Medicine | Admitting: Internal Medicine

## 2014-08-23 ENCOUNTER — Other Ambulatory Visit: Payer: Self-pay | Admitting: Medical Oncology

## 2014-08-23 ENCOUNTER — Telehealth: Payer: Self-pay | Admitting: Internal Medicine

## 2014-08-23 ENCOUNTER — Telehealth: Payer: Self-pay | Admitting: Medical Oncology

## 2014-08-23 DIAGNOSIS — C3411 Malignant neoplasm of upper lobe, right bronchus or lung: Secondary | ICD-10-CM

## 2014-08-23 DIAGNOSIS — M79605 Pain in left leg: Secondary | ICD-10-CM | POA: Insufficient documentation

## 2014-08-23 DIAGNOSIS — M7989 Other specified soft tissue disorders: Secondary | ICD-10-CM | POA: Insufficient documentation

## 2014-08-23 DIAGNOSIS — M79606 Pain in leg, unspecified: Secondary | ICD-10-CM | POA: Diagnosis present

## 2014-08-23 DIAGNOSIS — M79604 Pain in right leg: Secondary | ICD-10-CM | POA: Insufficient documentation

## 2014-08-23 NOTE — Telephone Encounter (Signed)
Neg doppler -pt sent home.

## 2014-08-23 NOTE — Progress Notes (Signed)
*  PRELIMINARY RESULTS* Vascular Ultrasound Lower extremity venous duplex has been completed.  Preliminary findings: negative for DVT.  Attempted call report to Dr. Julien Nordmann. Left voice message with results.    Landry Mellow, RDMS, RVT  08/23/2014, 11:09 AM

## 2014-08-23 NOTE — Progress Notes (Signed)
Pt requested refill xanax yesterday , I told her she should have enough until next week . She was going to check at home.

## 2014-08-23 NOTE — Telephone Encounter (Signed)
Confirmed appointment for appointment for today Doppler at [facility].

## 2014-08-23 NOTE — Telephone Encounter (Signed)
TC from patient asking if Dr. Julien Nordmann is going to order any medication for her leg swelling. She understands that her doppler study was negative.  Also she needs a refill called in for her Xanax-last filled 08/03/14 # 30

## 2014-08-24 ENCOUNTER — Encounter: Payer: Self-pay | Admitting: Internal Medicine

## 2014-08-25 ENCOUNTER — Encounter: Payer: Self-pay | Admitting: Internal Medicine

## 2014-08-25 ENCOUNTER — Telehealth: Payer: Self-pay | Admitting: Medical Oncology

## 2014-08-25 DIAGNOSIS — C7931 Secondary malignant neoplasm of brain: Secondary | ICD-10-CM

## 2014-08-25 MED ORDER — FUROSEMIDE 20 MG PO TABS: 20.0000 mg | ORAL_TABLET | Freq: Every day | ORAL | Status: DC | PRN

## 2014-08-25 NOTE — Telephone Encounter (Signed)
Called mobile number and got her sone. I gave him  Message for her that lasix order sent to her pharmacy and it was too early for her other medication ( xanax) . He  said he will give her the message.

## 2014-09-01 ENCOUNTER — Other Ambulatory Visit: Payer: Self-pay | Admitting: *Deleted

## 2014-09-01 DIAGNOSIS — C3411 Malignant neoplasm of upper lobe, right bronchus or lung: Secondary | ICD-10-CM

## 2014-09-02 ENCOUNTER — Ambulatory Visit (HOSPITAL_COMMUNITY)
Admission: RE | Admit: 2014-09-02 | Discharge: 2014-09-02 | Disposition: A | Discharge status: Home / Self Care | Payer: Medicare Other | Source: Ambulatory Visit | Attending: Internal Medicine | Admitting: Internal Medicine

## 2014-09-02 ENCOUNTER — Encounter (HOSPITAL_COMMUNITY): Payer: Self-pay

## 2014-09-02 DIAGNOSIS — C799 Secondary malignant neoplasm of unspecified site: Secondary | ICD-10-CM | POA: Diagnosis not present

## 2014-09-02 DIAGNOSIS — C3490 Malignant neoplasm of unspecified part of unspecified bronchus or lung: Secondary | ICD-10-CM

## 2014-09-02 DIAGNOSIS — C3481 Malignant neoplasm of overlapping sites of right bronchus and lung: Secondary | ICD-10-CM | POA: Diagnosis not present

## 2014-09-02 DIAGNOSIS — G893 Neoplasm related pain (acute) (chronic): Secondary | ICD-10-CM | POA: Insufficient documentation

## 2014-09-02 MED ORDER — IOHEXOL 300 MG/ML  SOLN
100.0000 mL | Freq: Once | INTRAMUSCULAR | Status: AC | PRN
  Administered 2014-09-02: 100 mL via INTRAVENOUS

## 2014-09-05 ENCOUNTER — Telehealth: Payer: Self-pay | Admitting: *Deleted

## 2014-09-05 DIAGNOSIS — G4700 Insomnia, unspecified: Secondary | ICD-10-CM

## 2014-09-05 DIAGNOSIS — C3410 Malignant neoplasm of upper lobe, unspecified bronchus or lung: Secondary | ICD-10-CM

## 2014-09-05 MED ORDER — ALPRAZOLAM 0.25 MG PO TABS: 0.2500 mg | ORAL_TABLET | Freq: Every evening | ORAL | Status: DC | PRN

## 2014-09-05 NOTE — Telephone Encounter (Signed)
"  Please cal in my xanax to [facility] in [place].  I'm out."

## 2014-09-05 NOTE — Telephone Encounter (Signed)
Called in xanax 

## 2014-09-06 ENCOUNTER — Telehealth: Payer: Self-pay | Admitting: Internal Medicine

## 2014-09-06 ENCOUNTER — Other Ambulatory Visit (HOSPITAL_BASED_OUTPATIENT_CLINIC_OR_DEPARTMENT_OTHER): Payer: Medicare Other

## 2014-09-06 ENCOUNTER — Ambulatory Visit (HOSPITAL_BASED_OUTPATIENT_CLINIC_OR_DEPARTMENT_OTHER): Payer: Medicare Other

## 2014-09-06 ENCOUNTER — Encounter: Payer: Self-pay | Admitting: Internal Medicine

## 2014-09-06 ENCOUNTER — Other Ambulatory Visit: Payer: Medicare Other

## 2014-09-06 ENCOUNTER — Ambulatory Visit (HOSPITAL_BASED_OUTPATIENT_CLINIC_OR_DEPARTMENT_OTHER): Payer: Medicare Other | Admitting: Internal Medicine

## 2014-09-06 VITALS — BP 94/88 | HR 86 | Temp 98.3°F | Resp 16 | Wt 146.9 lb

## 2014-09-06 DIAGNOSIS — C3490 Malignant neoplasm of unspecified part of unspecified bronchus or lung: Secondary | ICD-10-CM

## 2014-09-06 DIAGNOSIS — C7931 Secondary malignant neoplasm of brain: Secondary | ICD-10-CM

## 2014-09-06 DIAGNOSIS — C3411 Malignant neoplasm of upper lobe, right bronchus or lung: Secondary | ICD-10-CM

## 2014-09-06 DIAGNOSIS — Z006 Encounter for examination for normal comparison and control in clinical research program: Secondary | ICD-10-CM | POA: Diagnosis not present

## 2014-09-06 DIAGNOSIS — G893 Neoplasm related pain (acute) (chronic): Secondary | ICD-10-CM

## 2014-09-06 DIAGNOSIS — Z5111 Encounter for antineoplastic chemotherapy: Secondary | ICD-10-CM

## 2014-09-06 LAB — CBC WITH DIFFERENTIAL/PLATELET
BASO%: 0.2 % (ref 0.0–2.0)
BASOS ABS: 0 10*3/uL (ref 0.0–0.1)
EOS%: 0.8 % (ref 0.0–7.0)
Eosinophils Absolute: 0.1 10*3/uL (ref 0.0–0.5)
HCT: 34.6 % — ABNORMAL LOW (ref 34.8–46.6)
HEMOGLOBIN: 11 g/dL — AB (ref 11.6–15.9)
LYMPH%: 29.1 % (ref 14.0–49.7)
MCH: 28.9 pg (ref 25.1–34.0)
MCHC: 31.8 g/dL (ref 31.5–36.0)
MCV: 91.1 fL (ref 79.5–101.0)
MONO#: 1.1 10*3/uL — ABNORMAL HIGH (ref 0.1–0.9)
MONO%: 11.5 % (ref 0.0–14.0)
NEUT#: 5.7 10*3/uL (ref 1.5–6.5)
NEUT%: 58.4 % (ref 38.4–76.8)
Platelets: 427 10*3/uL — ABNORMAL HIGH (ref 145–400)
RBC: 3.8 10*6/uL (ref 3.70–5.45)
RDW: 15.4 % — AB (ref 11.2–14.5)
WBC: 9.8 10*3/uL (ref 3.9–10.3)
lymph#: 2.8 10*3/uL (ref 0.9–3.3)

## 2014-09-06 LAB — COMPREHENSIVE METABOLIC PANEL (CC13)
ALT: 10 U/L (ref 0–55)
AST: 14 U/L (ref 5–34)
Albumin: 3.3 g/dL — ABNORMAL LOW (ref 3.5–5.0)
Alkaline Phosphatase: 85 U/L (ref 40–150)
Anion Gap: 8 mEq/L (ref 3–11)
BUN: 19.5 mg/dL (ref 7.0–26.0)
CHLORIDE: 103 meq/L (ref 98–109)
CO2: 28 meq/L (ref 22–29)
Calcium: 9.6 mg/dL (ref 8.4–10.4)
Creatinine: 1.1 mg/dL (ref 0.6–1.1)
EGFR: 56 mL/min/{1.73_m2} — AB (ref >90)
GLUCOSE: 146 mg/dL — AB (ref 70–140)
POTASSIUM: 4.5 meq/L (ref 3.5–5.1)
SODIUM: 140 meq/L (ref 136–145)
Total Bilirubin: 0.34 mg/dL (ref 0.20–1.20)
Total Protein: 6.7 g/dL (ref 6.4–8.3)

## 2014-09-06 LAB — RESEARCH LABS

## 2014-09-06 MED ORDER — SODIUM CHLORIDE 0.9 % IV SOLN
Freq: Once | INTRAVENOUS | Status: AC
  Administered 2014-09-06: 11:00:00 via INTRAVENOUS
  Filled 2014-09-06: qty 4

## 2014-09-06 MED ORDER — SODIUM CHLORIDE 0.9 % IV SOLN
500.0000 mg/m2 | Freq: Once | INTRAVENOUS | Status: AC
  Administered 2014-09-06: 900 mg via INTRAVENOUS
  Filled 2014-09-06: qty 36

## 2014-09-06 MED ORDER — HEPARIN SOD (PORK) LOCK FLUSH 100 UNIT/ML IV SOLN
500.0000 [IU] | Freq: Once | INTRAVENOUS | Status: AC | PRN
  Administered 2014-09-06: 500 [IU]
  Filled 2014-09-06: qty 5

## 2014-09-06 MED ORDER — SODIUM CHLORIDE 0.9 % IJ SOLN
10.0000 mL | INTRAMUSCULAR | Status: DC | PRN
  Administered 2014-09-06: 10 mL
  Filled 2014-09-06: qty 10

## 2014-09-06 MED ORDER — SODIUM CHLORIDE 0.9 % IV SOLN
Freq: Once | INTRAVENOUS | Status: AC
  Administered 2014-09-06: 11:00:00 via INTRAVENOUS

## 2014-09-06 NOTE — Progress Notes (Signed)
BMS [id number] - questionnaires (PROs) - patient into the cancer center for routine visit. Patient was given PROs upon arrival to the cancer center. The patient completed her PROs ([id number] first and then the LCSS booklet).  I checked the PROs for completeness.  Research blood was drawn. The patient was thanked for her continued support of this clinical trial. Barb Darron Stuck 09/06/2014 10:09 AM

## 2014-09-06 NOTE — Progress Notes (Signed)
[place] [phone]   [phone]) [phone]  OFFICE PROGRESS NOTE  Danielle Lofts, MD [place] [address]  Diagnosis:  Metastatic non-small cell lung cancer initially diagnosed with locally advanced stage IIIB with right Pancoast tumor involving the vertebral body as well as the foraminal canal invasion with spinal cord compression in October of 2007.   Prior Therapy:  1. Status post concurrent chemoradiation with weekly carboplatin and paclitaxel, last dose was given November 18, 2005. 2. 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 3. Status post gamma knife radiotherapy to a solitary brain lesion located in the superior frontal area of the brain at [facility] in April of 2008. 4. Status post palliative radiotherapy to the lateral abdominal wall metastatic lesion under the care of Dr. Lisbeth Renshaw, completed March of 2009. 5. Status post 6 cycles of systemic chemotherapy with carboplatin and Alimta. Last dose was given July 26, 2007 with disease stabilization. 6. Gamma knife stereotactic radiotherapy to 2 brain lesions one involving the right frontal dural based as well as right parietal lesion performed on 05/07/2012 under the care of Dr. Vallarie Mare at [facility].  Current Therapy:  Maintenance chemotherapy with Alimta 500 mg per meter squared given every 3 weeks. The patient is status post 121 cycles.   CHEMOTHERAPY INTENT: Palliative/maintenance  CURRENT # OF CHEMOTHERAPY CYCLES: 122 CURRENT ANTIEMETICS: Zofran, dexamethasone and Compazine  CURRENT SMOKING STATUS: Quit smoking 10/07/2005  ORAL CHEMOTHERAPY AND CONSENT: None  CURRENT BISPHOSPHONATES USE: None  PAIN MANAGEMENT: 0/10 currently on morphine  NARCOTICS INDUCED CONSTIPATION: No constipation  LIVING WILL AND CODE STATUS: No CODE BLUE   INTERVAL HISTORY: Danielle [name] 64 y.o. female  returns to the clinic today for followup visit accompanied by her sister. The patient is feeling fine today with no specific complaints at for mild swelling in her lower extremities and she is currently on Lasix 20 mg by mouth daily as needed. No significant fever or chills. She is tolerating her maintenance treatment with single agent Alimta fairly well with no significant adverse effects. The patient denied having any chest pain, shortness of breath, cough or hemoptysis. No nausea or vomiting. The patient denied having any weight loss or night sweats. She had repeat CT scan of the chest, abdomen and pelvis performed recently and she is here today for evaluation and discussion of her scan results before starting cycle #121 her chemotherapy.  MEDICAL HISTORY: Past Medical History  Diagnosis Date  . Hypertension   . Anemia   . H/O: pneumonia   . History of tobacco abuse quit 10/08    on nicotine patch  . Fibromyalgia   . Hypokalemia   . DJD (degenerative joint disease), cervical   . Thrush 12/11/2010  . 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 had chemo and radiation for lung ca  . CVA (cerebral vascular accident) 04/2014    pt states had 2 cva's within 2 wks    ALLERGIES:  is allergic to contrast media; iohexol; sulfa drugs cross reactors; and co-trimoxazole injection.  MEDICATIONS:  Current Outpatient Prescriptions  Medication Sig Dispense Refill  . ALPRAZolam (XANAX) 0.25 MG tablet Take 1 tablet (0.25 mg total) by mouth at bedtime as needed for sleep. 30 tablet 0  . amLODipine (NORVASC) 10 MG tablet TAKE 1 TABLET (10 MG TOTAL) BY MOUTH DAILY. 90 tablet 1  .  aspirin EC 325 MG tablet TAKE 1 TABLET (325 MG TOTAL) BY MOUTH DAILY. 30 tablet 0  . atorvastatin (LIPITOR) 40 MG tablet Take 1 tablet (40 mg total) by mouth daily at 6 PM. 90 tablet 3  . fluticasone (FLONASE) 50 MCG/ACT nasal spray USE 1-2 SPRAYS IN EACH NOSTRIL DAILY (Patient taking differently: USE 1-2  SPRAYS IN EACH NOSTRIL DAILY PRN CONGESTION) 16 g 11  . folic acid (FOLVITE) 1 MG tablet TAKE 1 TABLET BY MOUTH DAILY. 90 tablet 1  . furosemide (LASIX) 20 MG tablet Take 1 tablet (20 mg total) by mouth daily as needed for edema. 10 tablet 0  . lacosamide (VIMPAT) 50 MG TABS tablet Take 1 tablet (50 mg total) by mouth 2 (two) times daily. 60 tablet 0  . lidocaine-prilocaine (EMLA) cream Apply to Port-A-Cath as directed 30 g 1  . lisinopril (PRINIVIL,ZESTRIL) 20 MG tablet TAKE 2 TABLETS (40 MG TOTAL) BY MOUTH DAILY. 60 tablet 5  . morphine (MS CONTIN) 60 MG 12 hr tablet Take 1 tablet (60 mg total) by mouth 2 (two) times daily. 60 tablet 0  . morphine (MSIR) 30 MG tablet Take 1 tablet (30 mg total) by mouth every 4 (four) hours as needed (breakthrough pain). 60 tablet 0  . Olopatadine HCl 0.2 % SOLN Apply 1 drop to eye daily. 2.5 mL 11  . ondansetron (ZOFRAN) 8 MG tablet Take 1 tablet (8 mg total) by mouth 2 (two) times daily as needed for nausea or vomiting. 30 tablet 2  . predniSONE (DELTASONE) 50 MG tablet Take 50 mg by mouth See admin instructions. Take 1 tablet (50 mg) 13 hours before CT scan, 7 hours before CT scan and 1 hour before CT scan (every 9 weeks)     No current facility-administered medications for this visit.    SURGICAL HISTORY:  Past Surgical History  Procedure Laterality Date  . Cervical laminectomy  1995  . Other surgical history  2008    Gamma knife surgery to remove brain met   . 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 Knee surgery  1990    Left x 2  . Portacath placement  -[name]    TIP IN CAVOATRIAL JUNCTION  . Loop recorder implant N/A 04/19/2014    Procedure: LOOP RECORDER IMPLANT;  Surgeon: Thompson Grayer, MD;  Location: [facility] CATH LAB;  Service: Cardiovascular;  Laterality: N/A;  . Tee without cardioversion N/A 04/19/2014    Procedure: TRANSESOPHAGEAL ECHOCARDIOGRAM (TEE);  Surgeon: Larey Dresser, MD;  Location: [place];   Service: Cardiovascular;  Laterality: N/A;    REVIEW OF SYSTEMS:  Constitutional: negative Eyes: negative Ears, nose, mouth, throat, and face: negative Respiratory: negative Cardiovascular: negative Gastrointestinal: negative Genitourinary:negative Integument/breast: negative Hematologic/lymphatic: negative Musculoskeletal:negative Neurological: negative Behavioral/Psych: negative Endocrine: negative Allergic/Immunologic: negative   PHYSICAL EXAMINATION: General appearance: alert, cooperative and no distress Head: Normocephalic, without obvious abnormality, atraumatic Neck: no adenopathy, no JVD, supple, symmetrical, trachea midline and thyroid not enlarged, symmetric, no tenderness/mass/nodules Lymph nodes: Cervical, supraclavicular, and axillary nodes normal. Resp: clear to auscultation bilaterally Back: symmetric, no curvature. ROM normal. No CVA tenderness. Cardio: regular rate and rhythm, S1, S2 normal, no murmur, click, rub or gallop GI: soft, non-tender; bowel sounds normal; no masses,  no organomegaly Extremities: extremities normal, atraumatic, no cyanosis or edema Neurologic: Alert and oriented X 3, normal strength and tone. Normal symmetric reflexes. Normal coordination and gait  ECOG PERFORMANCE STATUS: 0 - Asymptomatic  There were no vitals taken for this visit.  LABORATORY DATA: Lab Results  Component Value Date   WBC 9.8 09/06/2014   HGB 11.0* 09/06/2014   HCT 34.6* 09/06/2014   MCV 91.1 09/06/2014   PLT 427* 09/06/2014      Chemistry      Component Value Date/Time   NA 141 08/16/2014 1040   NA 135 04/26/2014 1205   K 4.7 08/16/2014 1040   K 4.2 04/26/2014 1205   CL 103 04/26/2014 1205   CL 106 06/30/2012 1004   CO2 26 08/16/2014 1040   CO2 29 04/26/2014 1205   BUN 19.3 08/16/2014 1040   BUN 17 04/26/2014 1205   CREATININE 0.9 08/16/2014 1040   CREATININE 0.67 04/26/2014 1205      Component Value Date/Time   CALCIUM 9.5 08/16/2014 1040    CALCIUM 9.5 04/26/2014 1205   ALKPHOS 80 08/16/2014 1040   ALKPHOS 57 04/26/2014 1205   AST 19 08/16/2014 1040   AST 15 04/26/2014 1205   ALT 16 08/16/2014 1040   ALT 10 04/26/2014 1205   BILITOT 0.31 08/16/2014 1040   BILITOT 0.4 04/26/2014 1205       RADIOGRAPHIC STUDIES: Ct Chest W Contrast  09/02/2014   CLINICAL DATA:  Metastatic lung cancer, restaging. Original diagnosis 2007, brain recurrence 2014. Ongoing chemotherapy. Left chest radiation.  EXAM: CT CHEST, ABDOMEN, AND PELVIS WITH CONTRAST  TECHNIQUE: Multidetector CT imaging of the chest, abdomen and pelvis was performed following the standard protocol during bolus administration of intravenous contrast.  CONTRAST:  133m OMNIPAQUE IOHEXOL 300 MG/ML  SOLN  COMPARISON:  12/24/2013  FINDINGS: CT CHEST FINDINGS  Mediastinum/Nodes: Right-sided Port-A-Cath in place with tip in the high right atrium. Heart size is normal. No pericardial effusion. No lymphadenopathy. Trace fluid is noted in the superior pericardial recess. Moderate atheromatous aortic calcification and coronary arterial calcification without aneurysm. No new mediastinal, hilar, or axillary lymphadenopathy.  Lungs/Pleura: Irregular right apical pleural thickening image 10 is stable. Diffuse emphysematous change reidentified. Subpleural anterior right upper lobe nodularity measuring 0.9 cm image 18 series 5 is stable. Mild central bronchial wall thickening with a few areas of dependent bibasilar presumed secretions noted. No new pulmonary mass, nodule, or consolidation. No pleural effusion.  Upper abdomen: Please see dedicated report below.  Musculoskeletal: Event recorder within the anterior chest wall subcutaneous tissue reidentified. Thoracic vertebroplasty at T4 and T7 noted. T3 vertebra plana. No new lytic or sclerotic osseous lesion.  CT ABDOMEN AND PELVIS FINDINGS  Lower chest:  Please see dedicated report above.  Hepatobiliary: Liver and gallbladder appear normal.  Pancreas:  Normal  Spleen: Normal  Adrenals/Urinary Tract: Kidneys and adrenal glands appear normal.  Stomach/Bowel: Normal appearing stomach. No bowel wall thickening or focal segmental dilatation is identified. Normal appendix.  Vascular/Lymphatic: Moderate atheromatous aortic calcification without aneurysm. No lymphadenopathy.  Other: No ascites or free air.  Uterus and ovaries appear normal.  Musculoskeletal: No lytic or sclerotic osseous lesion.  IMPRESSION: No significant change in right upper lobe apical irregular pleural thickening or irregular 0.9 cm pleural based nodularity.  No new evidence for metastatic disease within the chest, abdomen, or pelvis.  Central bronchial wall thickening with predominantly basilar presumed intrabronchial secretions which may suggest acute and/or chronic bronchitis.   Electronically Signed   By: GConchita ParisM.D.   On: 09/02/2014 08:39   Ct Abdomen Pelvis W Contrast  09/02/2014   CLINICAL DATA:  Metastatic lung cancer, restaging. Original diagnosis 2007, brain recurrence 2014. Ongoing  chemotherapy. Left chest radiation.  EXAM: CT CHEST, ABDOMEN, AND PELVIS WITH CONTRAST  TECHNIQUE: Multidetector CT imaging of the chest, abdomen and pelvis was performed following the standard protocol during bolus administration of intravenous contrast.  CONTRAST:  160m OMNIPAQUE IOHEXOL 300 MG/ML  SOLN  COMPARISON:  12/24/2013  FINDINGS: CT CHEST FINDINGS  Mediastinum/Nodes: Right-sided Port-A-Cath in place with tip in the high right atrium. Heart size is normal. No pericardial effusion. No lymphadenopathy. Trace fluid is noted in the superior pericardial recess. Moderate atheromatous aortic calcification and coronary arterial calcification without aneurysm. No new mediastinal, hilar, or axillary lymphadenopathy.  Lungs/Pleura: Irregular right apical pleural thickening image 10 is stable. Diffuse emphysematous change reidentified. Subpleural anterior right upper lobe nodularity measuring 0.9 cm  image 18 series 5 is stable. Mild central bronchial wall thickening with a few areas of dependent bibasilar presumed secretions noted. No new pulmonary mass, nodule, or consolidation. No pleural effusion.  Upper abdomen: Please see dedicated report below.  Musculoskeletal: Event recorder within the anterior chest wall subcutaneous tissue reidentified. Thoracic vertebroplasty at T4 and T7 noted. T3 vertebra plana. No new lytic or sclerotic osseous lesion.  CT ABDOMEN AND PELVIS FINDINGS  Lower chest:  Please see dedicated report above.  Hepatobiliary: Liver and gallbladder appear normal.  Pancreas: Normal  Spleen: Normal  Adrenals/Urinary Tract: Kidneys and adrenal glands appear normal.  Stomach/Bowel: Normal appearing stomach. No bowel wall thickening or focal segmental dilatation is identified. Normal appendix.  Vascular/Lymphatic: Moderate atheromatous aortic calcification without aneurysm. No lymphadenopathy.  Other: No ascites or free air.  Uterus and ovaries appear normal.  Musculoskeletal: No lytic or sclerotic osseous lesion.  IMPRESSION: No significant change in right upper lobe apical irregular pleural thickening or irregular 0.9 cm pleural based nodularity.  No new evidence for metastatic disease within the chest, abdomen, or pelvis.  Central bronchial wall thickening with predominantly basilar presumed intrabronchial secretions which may suggest acute and/or chronic bronchitis.   Electronically Signed   By: GConchita ParisM.D.   On: 09/02/2014 08:39   ASSESSMENT AND PLAN: This is a very pleasant 64years old white female with metastatic non-small cell lung cancer currently undergoing maintenance chemotherapy with single agent Alimta status post 121 cycles. The patient is tolerating her treatment fairly well with no significant adverse effects.  Her recent CT scan of the chest, abdomen and pelvis showed no evidence for disease progression. I discussed the scan results with the patient and her  sister. I recommended for the patient to proceed with her systemic therapy today as scheduled. She will receive cycle #122 today. The patient would come back for followup visit in 3 weeks for evaluation before starting cycle #123. For pain management, she will continue on her current pain medication with MS Contin and MS IR.  For the swelling of the lower extremity, the patient will continue only Lasix 20 mg by mouth daily as needed. She was advised to call immediately if she has any concerning symptoms in the interval. The patient voices understanding of current disease status and treatment options and is in agreement with the current care plan.  All questions were answered. The patient knows to call the clinic with any problems, questions or concerns. We can certainly see the patient much sooner if necessary.  Disclaimer: This note was dictated with voice recognition software. Similar sounding words can inadvertently be transcribed and may not be corrected upon review.

## 2014-09-06 NOTE — Telephone Encounter (Signed)
Gave and printd new sched...per MD on to use MD only

## 2014-09-06 NOTE — Telephone Encounter (Signed)
Gave adn printed appt sched and avs for pt for Sept and OCT °

## 2014-09-11 ENCOUNTER — Other Ambulatory Visit: Payer: Self-pay | Admitting: Internal Medicine

## 2014-09-13 ENCOUNTER — Ambulatory Visit: Payer: Medicare Other

## 2014-09-15 ENCOUNTER — Telehealth: Payer: Self-pay | Admitting: *Deleted

## 2014-09-15 DIAGNOSIS — C7931 Secondary malignant neoplasm of brain: Secondary | ICD-10-CM

## 2014-09-15 MED ORDER — FUROSEMIDE 20 MG PO TABS: 20.0000 mg | ORAL_TABLET | Freq: Every day | ORAL | Status: DC | PRN

## 2014-09-15 NOTE — Telephone Encounter (Signed)
NOTIFIED PT. REFILLED SENT TO PHARMACY.

## 2014-09-16 ENCOUNTER — Ambulatory Visit (INDEPENDENT_AMBULATORY_CARE_PROVIDER_SITE_OTHER): Payer: Medicare Other | Admitting: *Deleted

## 2014-09-16 DIAGNOSIS — I639 Cerebral infarction, unspecified: Secondary | ICD-10-CM

## 2014-09-19 ENCOUNTER — Telehealth: Payer: Self-pay | Admitting: *Deleted

## 2014-09-19 DIAGNOSIS — C3410 Malignant neoplasm of upper lobe, unspecified bronchus or lung: Secondary | ICD-10-CM

## 2014-09-19 DIAGNOSIS — C3490 Malignant neoplasm of unspecified part of unspecified bronchus or lung: Secondary | ICD-10-CM

## 2014-09-19 MED ORDER — MORPHINE SULFATE ER 60 MG PO TBCR: 60.0000 mg | EXTENDED_RELEASE_TABLET | Freq: Two times a day (BID) | ORAL | Status: DC

## 2014-09-19 NOTE — Progress Notes (Signed)
Loop recorder 

## 2014-09-19 NOTE — Telephone Encounter (Signed)
"  I need the morphine 60 mg refilled.  Will come pick this up tomorrow."  Will notify Dr. Julien Nordmann.

## 2014-09-19 NOTE — Addendum Note (Signed)
Addended by: Ardeen Garland on: 09/19/2014 04:20 PM   Modules accepted: Orders

## 2014-09-19 NOTE — Telephone Encounter (Signed)
Rx ready for pick up. 

## 2014-09-27 ENCOUNTER — Other Ambulatory Visit (HOSPITAL_BASED_OUTPATIENT_CLINIC_OR_DEPARTMENT_OTHER): Payer: Medicare Other

## 2014-09-27 ENCOUNTER — Telehealth: Payer: Self-pay | Admitting: *Deleted

## 2014-09-27 ENCOUNTER — Encounter: Payer: Self-pay | Admitting: Internal Medicine

## 2014-09-27 ENCOUNTER — Other Ambulatory Visit: Payer: Self-pay | Admitting: Medical Oncology

## 2014-09-27 ENCOUNTER — Telehealth: Payer: Self-pay | Admitting: Internal Medicine

## 2014-09-27 ENCOUNTER — Ambulatory Visit (HOSPITAL_BASED_OUTPATIENT_CLINIC_OR_DEPARTMENT_OTHER): Payer: Medicare Other

## 2014-09-27 ENCOUNTER — Ambulatory Visit (HOSPITAL_BASED_OUTPATIENT_CLINIC_OR_DEPARTMENT_OTHER): Payer: Medicare Other | Admitting: Internal Medicine

## 2014-09-27 VITALS — BP 129/56 | HR 83 | Temp 98.6°F | Resp 18 | Ht 63.0 in | Wt 150.4 lb

## 2014-09-27 DIAGNOSIS — C3490 Malignant neoplasm of unspecified part of unspecified bronchus or lung: Secondary | ICD-10-CM

## 2014-09-27 DIAGNOSIS — C3411 Malignant neoplasm of upper lobe, right bronchus or lung: Secondary | ICD-10-CM

## 2014-09-27 DIAGNOSIS — C3410 Malignant neoplasm of upper lobe, unspecified bronchus or lung: Secondary | ICD-10-CM

## 2014-09-27 DIAGNOSIS — Z5111 Encounter for antineoplastic chemotherapy: Secondary | ICD-10-CM

## 2014-09-27 DIAGNOSIS — C7931 Secondary malignant neoplasm of brain: Secondary | ICD-10-CM

## 2014-09-27 DIAGNOSIS — Z23 Encounter for immunization: Secondary | ICD-10-CM | POA: Diagnosis not present

## 2014-09-27 DIAGNOSIS — G4700 Insomnia, unspecified: Secondary | ICD-10-CM

## 2014-09-27 LAB — COMPREHENSIVE METABOLIC PANEL (CC13)
ALT: 10 U/L (ref 0–55)
ANION GAP: 7 meq/L (ref 3–11)
AST: 17 U/L (ref 5–34)
Albumin: 3.1 g/dL — ABNORMAL LOW (ref 3.5–5.0)
Alkaline Phosphatase: 87 U/L (ref 40–150)
BILIRUBIN TOTAL: 0.23 mg/dL (ref 0.20–1.20)
BUN: 14.7 mg/dL (ref 7.0–26.0)
CALCIUM: 9.5 mg/dL (ref 8.4–10.4)
CO2: 28 mEq/L (ref 22–29)
CREATININE: 0.9 mg/dL (ref 0.6–1.1)
Chloride: 107 mEq/L (ref 98–109)
EGFR: 70 mL/min/{1.73_m2} — ABNORMAL LOW (ref >90)
Glucose: 102 mg/dl (ref 70–140)
Potassium: 4.7 mEq/L (ref 3.5–5.1)
Sodium: 142 mEq/L (ref 136–145)
TOTAL PROTEIN: 6.6 g/dL (ref 6.4–8.3)

## 2014-09-27 LAB — CBC WITH DIFFERENTIAL/PLATELET
BASO%: 0.7 % (ref 0.0–2.0)
Basophils Absolute: 0.1 10*3/uL (ref 0.0–0.1)
EOS%: 1.7 % (ref 0.0–7.0)
Eosinophils Absolute: 0.1 10*3/uL (ref 0.0–0.5)
HEMATOCRIT: 33.3 % — AB (ref 34.8–46.6)
HGB: 10.9 g/dL — ABNORMAL LOW (ref 11.6–15.9)
LYMPH#: 1.9 10*3/uL (ref 0.9–3.3)
LYMPH%: 27.3 % (ref 14.0–49.7)
MCH: 29.1 pg (ref 25.1–34.0)
MCHC: 32.7 g/dL (ref 31.5–36.0)
MCV: 89.2 fL (ref 79.5–101.0)
MONO#: 1 10*3/uL — AB (ref 0.1–0.9)
MONO%: 14 % (ref 0.0–14.0)
NEUT%: 56.3 % (ref 38.4–76.8)
NEUTROS ABS: 3.9 10*3/uL (ref 1.5–6.5)
PLATELETS: 391 10*3/uL (ref 145–400)
RBC: 3.73 10*6/uL (ref 3.70–5.45)
RDW: 15.6 % — ABNORMAL HIGH (ref 11.2–14.5)
WBC: 7 10*3/uL (ref 3.9–10.3)

## 2014-09-27 MED ORDER — HEPARIN SOD (PORK) LOCK FLUSH 100 UNIT/ML IV SOLN
500.0000 [IU] | Freq: Once | INTRAVENOUS | Status: AC | PRN
  Administered 2014-09-27: 500 [IU]
  Filled 2014-09-27: qty 5

## 2014-09-27 MED ORDER — ALPRAZOLAM 0.25 MG PO TABS: 0.2500 mg | ORAL_TABLET | Freq: Every evening | ORAL | Status: DC | PRN

## 2014-09-27 MED ORDER — CYANOCOBALAMIN 1000 MCG/ML IJ SOLN
1000.0000 ug | Freq: Once | INTRAMUSCULAR | Status: AC
  Administered 2014-09-27: 1000 ug via INTRAMUSCULAR

## 2014-09-27 MED ORDER — SODIUM CHLORIDE 0.9 % IV SOLN
500.0000 mg/m2 | Freq: Once | INTRAVENOUS | Status: AC
  Administered 2014-09-27: 900 mg via INTRAVENOUS
  Filled 2014-09-27: qty 36

## 2014-09-27 MED ORDER — CYANOCOBALAMIN 1000 MCG/ML IJ SOLN
INTRAMUSCULAR | Status: AC
  Filled 2014-09-27: qty 1

## 2014-09-27 MED ORDER — SODIUM CHLORIDE 0.9 % IV SOLN
Freq: Once | INTRAVENOUS | Status: AC
  Administered 2014-09-27: 11:00:00 via INTRAVENOUS

## 2014-09-27 MED ORDER — SODIUM CHLORIDE 0.9 % IJ SOLN
10.0000 mL | INTRAMUSCULAR | Status: DC | PRN
  Administered 2014-09-27: 10 mL
  Filled 2014-09-27: qty 10

## 2014-09-27 MED ORDER — INFLUENZA VAC SPLIT QUAD 0.5 ML IM SUSY
0.5000 mL | PREFILLED_SYRINGE | Freq: Once | INTRAMUSCULAR | Status: AC
  Administered 2014-09-27: 0.5 mL via INTRAMUSCULAR
  Filled 2014-09-27: qty 0.5

## 2014-09-27 MED ORDER — MORPHINE SULFATE 30 MG PO TABS: 30.0000 mg | ORAL_TABLET | ORAL | Status: DC | PRN

## 2014-09-27 MED ORDER — DEXAMETHASONE SODIUM PHOSPHATE 100 MG/10ML IJ SOLN
Freq: Once | INTRAMUSCULAR | Status: AC
  Administered 2014-09-27: 11:00:00 via INTRAVENOUS
  Filled 2014-09-27: qty 4

## 2014-09-27 NOTE — Telephone Encounter (Signed)
Per staff message and POF I have scheduled appts. Advised scheduler of appts. JMW  

## 2014-09-27 NOTE — Telephone Encounter (Signed)
per pof to sch pt appt-gave pt copy of avs-sent MW email to sch trmt-pt aware

## 2014-09-27 NOTE — Progress Notes (Signed)
[place] [phone]   Fax:(336) [phone]  OFFICE PROGRESS NOTE  [name], MD [place] [address]  Diagnosis:  Metastatic non-small cell lung cancer initially diagnosed with locally advanced stage IIIB with right Pancoast tumor involving the vertebral body as well as the foraminal canal invasion with spinal cord compression in October of 2007.   Prior Therapy:  1. Status post concurrent chemoradiation with weekly carboplatin and paclitaxel, last dose was given November 18, 2005. 2. 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 3. Status post gamma knife radiotherapy to a solitary brain lesion located in the superior frontal area of the brain at [facility] in April of 2008. 4. Status post palliative radiotherapy to the lateral abdominal wall metastatic lesion under the care of Dr. Lisbeth Renshaw, completed March of 2009. 5. Status post 6 cycles of systemic chemotherapy with carboplatin and Alimta. Last dose was given July 26, 2007 with disease stabilization. 6. Gamma knife stereotactic radiotherapy to 2 brain lesions one involving the right frontal dural based as well as right parietal lesion performed on 05/07/2012 under the care of Dr. Vallarie Mare at [facility].  Current Therapy:  Maintenance chemotherapy with Alimta 500 mg per meter squared given every 3 weeks. The patient is status post 122 cycles.   CHEMOTHERAPY INTENT: Palliative/maintenance  CURRENT # OF CHEMOTHERAPY CYCLES: 123 CURRENT ANTIEMETICS: Zofran, dexamethasone and Compazine  CURRENT SMOKING STATUS: Quit smoking 10/07/2005  ORAL CHEMOTHERAPY AND CONSENT: None  CURRENT BISPHOSPHONATES USE: None  PAIN MANAGEMENT: 0/10 currently on morphine  NARCOTICS INDUCED CONSTIPATION: No constipation  LIVING WILL AND CODE STATUS: No CODE BLUE  INTERVAL HISTORY: Danielle [name] 64 y.o. female  returns to the clinic today for followup visit. The patient is feeling fine today with no specific complaints at for intermittent mild swelling in her lower extremities and she is currently on Lasix 20 mg by mouth daily as needed with improvement of the swelling. She is tolerating her maintenance treatment with single agent Alimta fairly well with no significant adverse effects. The patient denied having any chest pain, shortness of breath, cough or hemoptysis. No nausea or vomiting. The patient denied having any weight loss or night sweats. She is here today to start cycle #123 of her maintenance treatment with single agent Alimta.  MEDICAL HISTORY: Past Medical History  Diagnosis Date  . Hypertension   . Anemia   . H/O: pneumonia   . History of tobacco abuse quit 10/08    on nicotine patch  . Fibromyalgia   . Hypokalemia   . DJD (degenerative joint disease), cervical   . Thrush 12/11/2010  . 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 had chemo and radiation for lung ca  . CVA (cerebral vascular accident) 04/2014    pt states had 2 cva's within 2 wks    ALLERGIES:  is allergic to contrast media; iohexol; sulfa drugs cross reactors; and co-trimoxazole injection.  MEDICATIONS:  Current Outpatient Prescriptions  Medication Sig Dispense Refill  . ALPRAZolam (XANAX) 0.25 MG tablet Take 1 tablet (0.25 mg total) by mouth at bedtime as needed for sleep. 30 tablet 0  . amLODipine (NORVASC) 10 MG tablet TAKE 1 TABLET (10 MG TOTAL) BY MOUTH DAILY. 90 tablet 1  . aspirin EC 325 MG tablet TAKE 1 TABLET (325 MG TOTAL) BY MOUTH DAILY. 30 tablet 0  . atorvastatin (  LIPITOR) 40 MG tablet Take 1 tablet (40 mg total) by mouth daily at 6 PM. 90 tablet 3  . fluticasone (FLONASE) 50 MCG/ACT nasal spray USE 1-2 SPRAYS IN EACH NOSTRIL DAILY (Patient taking differently: USE 1-2 SPRAYS IN EACH NOSTRIL DAILY PRN CONGESTION) 16 g 11  . folic acid (FOLVITE) 1 MG tablet TAKE 1 TABLET BY  MOUTH DAILY. 90 tablet 1  . furosemide (LASIX) 20 MG tablet Take 1 tablet (20 mg total) by mouth daily as needed for edema. 10 tablet 0  . lacosamide (VIMPAT) 50 MG TABS tablet Take 1 tablet (50 mg total) by mouth 2 (two) times daily. 60 tablet 0  . lidocaine-prilocaine (EMLA) cream Apply to Port-A-Cath as directed 30 g 1  . lisinopril (PRINIVIL,ZESTRIL) 20 MG tablet TAKE 2 TABLETS (40 MG TOTAL) BY MOUTH DAILY. 60 tablet 5  . morphine (MS CONTIN) 60 MG 12 hr tablet Take 1 tablet (60 mg total) by mouth 2 (two) times daily. 60 tablet 0  . morphine (MSIR) 30 MG tablet Take 1 tablet (30 mg total) by mouth every 4 (four) hours as needed (breakthrough pain). 60 tablet 0  . Olopatadine HCl 0.2 % SOLN Apply 1 drop to eye daily. 2.5 mL 11  . ondansetron (ZOFRAN) 8 MG tablet TAKE 1 TABLET (8 MG TOTAL) BY MOUTH 2 (TWO) TIMES DAILY AS NEEDED FOR NAUSEA OR VOMITING. 30 tablet 2  . predniSONE (DELTASONE) 50 MG tablet Take 50 mg by mouth See admin instructions. Take 1 tablet (50 mg) 13 hours before CT scan, 7 hours before CT scan and 1 hour before CT scan (every 9 weeks)     No current facility-administered medications for this visit.    SURGICAL HISTORY:  Past Surgical History  Procedure Laterality Date  . Cervical laminectomy  1995  . Other surgical history  2008    Gamma knife surgery to remove brain met   . 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 Knee surgery  1990    Left x 2  . Portacath placement  -[name]    TIP IN CAVOATRIAL JUNCTION  . Loop recorder implant N/A 04/19/2014    Procedure: LOOP RECORDER IMPLANT;  Surgeon: Thompson Grayer, MD;  Location: [facility] CATH LAB;  Service: Cardiovascular;  Laterality: N/A;  . Tee without cardioversion N/A 04/19/2014    Procedure: TRANSESOPHAGEAL ECHOCARDIOGRAM (TEE);  Surgeon: Larey Dresser, MD;  Location: [place];  Service: Cardiovascular;  Laterality: N/A;    REVIEW OF SYSTEMS:  Constitutional: negative Eyes:  negative Ears, nose, mouth, throat, and face: negative Respiratory: negative Cardiovascular: negative Gastrointestinal: negative Genitourinary:negative Integument/breast: negative Hematologic/lymphatic: negative Musculoskeletal:negative Neurological: negative Behavioral/Psych: negative Endocrine: negative Allergic/Immunologic: negative   PHYSICAL EXAMINATION: General appearance: alert, cooperative and no distress Head: Normocephalic, without obvious abnormality, atraumatic Neck: no adenopathy, no JVD, supple, symmetrical, trachea midline and thyroid not enlarged, symmetric, no tenderness/mass/nodules Lymph nodes: Cervical, supraclavicular, and axillary nodes normal. Resp: clear to auscultation bilaterally Back: symmetric, no curvature. ROM normal. No CVA tenderness. Cardio: regular rate and rhythm, S1, S2 normal, no murmur, click, rub or gallop GI: soft, non-tender; bowel sounds normal; no masses,  no organomegaly Extremities: extremities normal, atraumatic, no cyanosis or edema Neurologic: Alert and oriented X 3, normal strength and tone. Normal symmetric reflexes. Normal coordination and gait  ECOG PERFORMANCE STATUS: 0 - Asymptomatic  Blood pressure 129/56, pulse 83, temperature 98.6 F (37 C), temperature source Oral, resp. rate 18, height _0  (  1.6 m), weight 150 lb 6.4 oz (68.221 kg), SpO2 97 %.  LABORATORY DATA: Lab Results  Component Value Date   WBC 7.0 09/27/2014   HGB 10.9* 09/27/2014   HCT 33.3* 09/27/2014   MCV 89.2 09/27/2014   PLT 391 09/27/2014      Chemistry      Component Value Date/Time   NA 140 09/06/2014 0935   NA 135 04/26/2014 1205   K 4.5 09/06/2014 0935   K 4.2 04/26/2014 1205   CL 103 04/26/2014 1205   CL 106 06/30/2012 1004   CO2 28 09/06/2014 0935   CO2 29 04/26/2014 1205   BUN 19.5 09/06/2014 0935   BUN 17 04/26/2014 1205   CREATININE 1.1 09/06/2014 0935   CREATININE 0.67 04/26/2014 1205      Component Value Date/Time   CALCIUM  9.6 09/06/2014 0935   CALCIUM 9.5 04/26/2014 1205   ALKPHOS 85 09/06/2014 0935   ALKPHOS 57 04/26/2014 1205   AST 14 09/06/2014 0935   AST 15 04/26/2014 1205   ALT 10 09/06/2014 0935   ALT 10 04/26/2014 1205   BILITOT 0.34 09/06/2014 0935   BILITOT 0.4 04/26/2014 1205       RADIOGRAPHIC STUDIES:  ASSESSMENT AND PLAN: This is a very pleasant 64 years old white female with metastatic non-small cell lung cancer currently undergoing maintenance chemotherapy with single agent Alimta status post 122 cycles. The patient is tolerating her treatment fairly well with no significant adverse effects.  I recommended for the patient to proceed with her systemic therapy today as scheduled. She will receive cycle #123 today. The patient would come back for followup visit in 3 weeks for evaluation before starting cycle #124. For pain management, she will continue on her current pain medication with MS Contin and MS IR. She was giving refill of MS Contin and Xanax today. For the swelling of the lower extremity, the patient will continue only Lasix 20 mg by mouth daily as needed. She was advised to call immediately if she has any concerning symptoms in the interval. The patient voices understanding of current disease status and treatment options and is in agreement with the current care plan.  All questions were answered. The patient knows to call the clinic with any problems, questions or concerns. We can certainly see the patient much sooner if necessary.  Disclaimer: This note was dictated with voice recognition software. Similar sounding words can inadvertently be transcribed and may not be corrected upon review.

## 2014-09-28 ENCOUNTER — Ambulatory Visit: Payer: Medicare Other | Admitting: Nurse Practitioner

## 2014-09-28 ENCOUNTER — Ambulatory Visit: Payer: Medicare Other

## 2014-09-28 ENCOUNTER — Other Ambulatory Visit: Payer: Medicare Other

## 2014-09-28 DIAGNOSIS — C7931 Secondary malignant neoplasm of brain: Secondary | ICD-10-CM | POA: Diagnosis not present

## 2014-09-28 DIAGNOSIS — Z0389 Encounter for observation for other suspected diseases and conditions ruled out: Secondary | ICD-10-CM | POA: Diagnosis not present

## 2014-09-28 DIAGNOSIS — C3490 Malignant neoplasm of unspecified part of unspecified bronchus or lung: Secondary | ICD-10-CM | POA: Diagnosis not present

## 2014-10-03 ENCOUNTER — Telehealth: Payer: Self-pay | Admitting: *Deleted

## 2014-10-03 LAB — CUP PACEART REMOTE DEVICE CHECK: MDC IDC SESS DTM: 20160909183746

## 2014-10-03 NOTE — Telephone Encounter (Signed)
Return call to pt regarding Xanax Rx. Pt unavailable at home # called mobile # , pt son answered. Left message for pt her Rx is at her pharmacy to be picked up. Per [facility] this medication was called in  9/20 and on hold previously. Rx is now ready for pick up.

## 2014-10-03 NOTE — Telephone Encounter (Signed)
Patient called returning call from someone in Dr. Worthy Flank office.  This nurse notified her the Xanax called in on 09-27-2014 is ready for  Pick up.

## 2014-10-03 NOTE — Progress Notes (Signed)
Carelink summary report received. Battery status OK. Normal device function. No new symptom episodes, tachy episodes, brady, or pause episodes. No new AF episodes. Monthly summary reports and ROV with JA PRN. 

## 2014-10-09 ENCOUNTER — Other Ambulatory Visit: Payer: Self-pay | Admitting: Internal Medicine

## 2014-10-13 ENCOUNTER — Other Ambulatory Visit: Payer: Self-pay | Admitting: *Deleted

## 2014-10-13 ENCOUNTER — Other Ambulatory Visit: Payer: Self-pay | Admitting: Internal Medicine

## 2014-10-13 DIAGNOSIS — C3490 Malignant neoplasm of unspecified part of unspecified bronchus or lung: Secondary | ICD-10-CM

## 2014-10-13 DIAGNOSIS — C3411 Malignant neoplasm of upper lobe, right bronchus or lung: Secondary | ICD-10-CM

## 2014-10-17 ENCOUNTER — Ambulatory Visit (INDEPENDENT_AMBULATORY_CARE_PROVIDER_SITE_OTHER): Payer: Medicare Other | Admitting: *Deleted

## 2014-10-17 DIAGNOSIS — I639 Cerebral infarction, unspecified: Secondary | ICD-10-CM

## 2014-10-18 ENCOUNTER — Other Ambulatory Visit (HOSPITAL_BASED_OUTPATIENT_CLINIC_OR_DEPARTMENT_OTHER): Payer: Medicare Other

## 2014-10-18 ENCOUNTER — Ambulatory Visit (HOSPITAL_BASED_OUTPATIENT_CLINIC_OR_DEPARTMENT_OTHER): Payer: Medicare Other

## 2014-10-18 ENCOUNTER — Encounter: Payer: Self-pay | Admitting: Internal Medicine

## 2014-10-18 ENCOUNTER — Encounter: Payer: Self-pay | Admitting: Oncology

## 2014-10-18 ENCOUNTER — Ambulatory Visit (HOSPITAL_BASED_OUTPATIENT_CLINIC_OR_DEPARTMENT_OTHER): Payer: Medicare Other | Admitting: Internal Medicine

## 2014-10-18 VITALS — BP 125/67 | HR 80 | Temp 98.1°F | Resp 18 | Ht 63.0 in | Wt 149.3 lb

## 2014-10-18 DIAGNOSIS — C3411 Malignant neoplasm of upper lobe, right bronchus or lung: Secondary | ICD-10-CM

## 2014-10-18 DIAGNOSIS — C3410 Malignant neoplasm of upper lobe, unspecified bronchus or lung: Secondary | ICD-10-CM

## 2014-10-18 DIAGNOSIS — C7931 Secondary malignant neoplasm of brain: Secondary | ICD-10-CM

## 2014-10-18 DIAGNOSIS — C3490 Malignant neoplasm of unspecified part of unspecified bronchus or lung: Secondary | ICD-10-CM

## 2014-10-18 DIAGNOSIS — Z006 Encounter for examination for normal comparison and control in clinical research program: Secondary | ICD-10-CM | POA: Diagnosis not present

## 2014-10-18 DIAGNOSIS — Z5111 Encounter for antineoplastic chemotherapy: Secondary | ICD-10-CM

## 2014-10-18 LAB — CBC WITH DIFFERENTIAL/PLATELET
BASO%: 1 % (ref 0.0–2.0)
Basophils Absolute: 0.1 10*3/uL (ref 0.0–0.1)
EOS ABS: 0.1 10*3/uL (ref 0.0–0.5)
EOS%: 1 % (ref 0.0–7.0)
HCT: 35.3 % (ref 34.8–46.6)
HGB: 11.5 g/dL — ABNORMAL LOW (ref 11.6–15.9)
LYMPH%: 23.4 % (ref 14.0–49.7)
MCH: 29.4 pg (ref 25.1–34.0)
MCHC: 32.5 g/dL (ref 31.5–36.0)
MCV: 90.3 fL (ref 79.5–101.0)
MONO#: 0.9 10*3/uL (ref 0.1–0.9)
MONO%: 10 % (ref 0.0–14.0)
NEUT#: 6 10*3/uL (ref 1.5–6.5)
NEUT%: 64.6 % (ref 38.4–76.8)
PLATELETS: 437 10*3/uL — AB (ref 145–400)
RBC: 3.9 10*6/uL (ref 3.70–5.45)
RDW: 16 % — ABNORMAL HIGH (ref 11.2–14.5)
WBC: 9.3 10*3/uL (ref 3.9–10.3)
lymph#: 2.2 10*3/uL (ref 0.9–3.3)

## 2014-10-18 LAB — COMPREHENSIVE METABOLIC PANEL (CC13)
ALT: 10 U/L (ref 0–55)
ANION GAP: 8 meq/L (ref 3–11)
AST: 20 U/L (ref 5–34)
Albumin: 3.4 g/dL — ABNORMAL LOW (ref 3.5–5.0)
Alkaline Phosphatase: 102 U/L (ref 40–150)
BUN: 13.7 mg/dL (ref 7.0–26.0)
CALCIUM: 10 mg/dL (ref 8.4–10.4)
CHLORIDE: 110 meq/L — AB (ref 98–109)
CO2: 26 mEq/L (ref 22–29)
Creatinine: 0.9 mg/dL (ref 0.6–1.1)
EGFR: 66 mL/min/{1.73_m2} — ABNORMAL LOW (ref >90)
Glucose: 153 mg/dl — ABNORMAL HIGH (ref 70–140)
POTASSIUM: 4.9 meq/L (ref 3.5–5.1)
Sodium: 144 mEq/L (ref 136–145)
Total Bilirubin: 0.37 mg/dL (ref 0.20–1.20)
Total Protein: 6.9 g/dL (ref 6.4–8.3)

## 2014-10-18 LAB — RESEARCH LABS

## 2014-10-18 MED ORDER — SODIUM CHLORIDE 0.9 % IJ SOLN
10.0000 mL | INTRAMUSCULAR | Status: DC | PRN
  Administered 2014-10-18: 10 mL
  Filled 2014-10-18: qty 10

## 2014-10-18 MED ORDER — SODIUM CHLORIDE 0.9 % IV SOLN
Freq: Once | INTRAVENOUS | Status: AC
  Administered 2014-10-18: 12:00:00 via INTRAVENOUS

## 2014-10-18 MED ORDER — SODIUM CHLORIDE 0.9 % IV SOLN
Freq: Once | INTRAVENOUS | Status: AC
  Administered 2014-10-18: 12:00:00 via INTRAVENOUS
  Filled 2014-10-18: qty 4

## 2014-10-18 MED ORDER — HEPARIN SOD (PORK) LOCK FLUSH 100 UNIT/ML IV SOLN
500.0000 [IU] | Freq: Once | INTRAVENOUS | Status: AC | PRN
  Administered 2014-10-18: 500 [IU]
  Filled 2014-10-18: qty 5

## 2014-10-18 MED ORDER — MORPHINE SULFATE ER 60 MG PO TBCR: 60.0000 mg | EXTENDED_RELEASE_TABLET | Freq: Two times a day (BID) | ORAL | Status: DC

## 2014-10-18 MED ORDER — SODIUM CHLORIDE 0.9 % IV SOLN
500.0000 mg/m2 | Freq: Once | INTRAVENOUS | Status: AC
  Administered 2014-10-18: 900 mg via INTRAVENOUS
  Filled 2014-10-18: qty 36

## 2014-10-18 NOTE — Patient Instructions (Signed)
[facility] Discharge Instructions for Patients Receiving Chemotherapy  Today you received the following chemotherapy agents Alimta To help prevent nausea and vomiting after your treatment, we encourage you to take your nausea medication as prtescribed.   If you develop nausea and vomiting that is not controlled by your nausea medication, call the clinic.   BELOW ARE SYMPTOMS THAT SHOULD BE REPORTED IMMEDIATELY:  *FEVER GREATER THAN 100.5 F  *CHILLS WITH OR WITHOUT FEVER  NAUSEA AND VOMITING THAT IS NOT CONTROLLED WITH YOUR NAUSEA MEDICATION  *UNUSUAL SHORTNESS OF BREATH  *UNUSUAL BRUISING OR BLEEDING  TENDERNESS IN MOUTH AND THROAT WITH OR WITHOUT PRESENCE OF ULCERS  *URINARY PROBLEMS  *BOWEL PROBLEMS  UNUSUAL RASH Items with * indicate a potential emergency and should be followed up as soon as possible.  Feel free to call the clinic you have any questions or concerns. The clinic phone number is [phone].  Please show the [facility] at check-in to the Emergency Department and triage nurse.

## 2014-10-18 NOTE — Progress Notes (Signed)
10/18/14 - BMS [id number] - Questionnaires (PRO's) - Patient into the cancer center for routine visit.  Patient given PRO's upon arrival to the cancer center.  Patient completed PRO's ([id number] first and then the [facility] booklet) before any study procedures were done.  I checked the PRO's for completeness.  The patient then went to the lab and research labs were drawn.  The patient was thanked for her continued support in this clinical trial. Remer Macho 10/20/14 - 10:15 am

## 2014-10-18 NOTE — Progress Notes (Signed)
[place] Telephone:([phone]   Fax:(336) [phone]  OFFICE PROGRESS NOTE  [name], MD [place] [address]  Diagnosis:  Metastatic non-small cell lung cancer initially diagnosed with locally advanced stage IIIB with right Pancoast tumor involving the vertebral body as well as the foraminal canal invasion with spinal cord compression in October of 2007.   Prior Therapy:  1. Status post concurrent chemoradiation with weekly carboplatin and paclitaxel, last dose was given November 18, 2005. 2. 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 3. Status post gamma knife radiotherapy to a solitary brain lesion located in the superior frontal area of the brain at [facility] in April of 2008. 4. Status post palliative radiotherapy to the lateral abdominal wall metastatic lesion under the care of Dr. Lisbeth Renshaw, completed March of 2009. 5. Status post 6 cycles of systemic chemotherapy with carboplatin and Alimta. Last dose was given July 26, 2007 with disease stabilization. 6. Gamma knife stereotactic radiotherapy to 2 brain lesions one involving the right frontal dural based as well as right parietal lesion performed on 05/07/2012 under the care of Dr. Vallarie Mare at [facility].  Current Therapy:  Maintenance chemotherapy with Alimta 500 mg per meter squared given every 3 weeks. The patient is status post 123 cycles.   CHEMOTHERAPY INTENT: Palliative/maintenance  CURRENT # OF CHEMOTHERAPY CYCLES: 124 CURRENT ANTIEMETICS: Zofran, dexamethasone and Compazine  CURRENT SMOKING STATUS: Quit smoking 10/07/2005  ORAL CHEMOTHERAPY AND CONSENT: None  CURRENT BISPHOSPHONATES USE: None  PAIN MANAGEMENT: 0/10 currently on morphine  NARCOTICS INDUCED CONSTIPATION: No constipation  LIVING WILL AND CODE STATUS: No CODE BLUE  INTERVAL HISTORY: Danielle [name] 64 y.o. female  returns to the clinic today for followup visit. The patient is feeling fine today with no specific complaints except for intermittent back pain and she is currently on MS Contin and morphine sulfate for pain management. She is tolerating her maintenance treatment with single agent Alimta fairly well with no significant adverse effects. The patient denied having any chest pain, shortness of breath, cough or hemoptysis. No nausea or vomiting. The patient denied having any weight loss or night sweats. She is here today to start cycle #124 of her maintenance treatment with single agent Alimta.  MEDICAL HISTORY: Past Medical History  Diagnosis Date  . Hypertension   . Anemia   . H/O: pneumonia   . History of tobacco abuse quit 10/08    on nicotine patch  . Fibromyalgia   . Hypokalemia   . DJD (degenerative joint disease), cervical   . Thrush 12/11/2010  . Lung cancer ([place]) dx'd 09/2005    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 had chemo and radiation for lung ca  . CVA (cerebral vascular accident) ([place]) 04/2014    pt states had 2 cva's within 2 wks    ALLERGIES:  is allergic to contrast media; iohexol; sulfa drugs cross reactors; and co-trimoxazole injection.  MEDICATIONS:  Current Outpatient Prescriptions  Medication Sig Dispense Refill  . ALPRAZolam (XANAX) 0.25 MG tablet Take 1 tablet (0.25 mg total) by mouth at bedtime as needed for sleep. 30 tablet 0  . amLODipine (NORVASC) 10 MG tablet TAKE 1 TABLET (10 MG TOTAL) BY MOUTH DAILY. 90 tablet 1  . aspirin EC 325 MG tablet TAKE 1 TABLET (325 MG TOTAL) BY MOUTH DAILY. 30 tablet 0  . atorvastatin (LIPITOR) 40 MG tablet Take 1 tablet (  40 mg total) by mouth daily at 6 PM. 90 tablet 3  . fluticasone (FLONASE) 50 MCG/ACT nasal spray USE 1-2 SPRAYS IN EACH NOSTRIL DAILY (Patient taking differently: USE 1-2 SPRAYS IN EACH NOSTRIL DAILY PRN CONGESTION) 16 g 11  . folic acid (FOLVITE) 1 MG tablet TAKE 1 TABLET BY MOUTH DAILY. 90 tablet 1  .  furosemide (LASIX) 20 MG tablet TAKE 1 TABLET (20 MG TOTAL) BY MOUTH DAILY AS NEEDED FOR EDEMA. 10 tablet 0  . lidocaine-prilocaine (EMLA) cream Apply to Port-A-Cath as directed 30 g 1  . lisinopril (PRINIVIL,ZESTRIL) 20 MG tablet TAKE 2 TABLETS (40 MG TOTAL) BY MOUTH DAILY. 60 tablet 5  . morphine (MS CONTIN) 60 MG 12 hr tablet Take 1 tablet (60 mg total) by mouth 2 (two) times daily. 60 tablet 0  . morphine (MSIR) 30 MG tablet Take 1 tablet (30 mg total) by mouth every 4 (four) hours as needed (breakthrough pain). 60 tablet 0  . Olopatadine HCl 0.2 % SOLN Apply 1 drop to eye daily. 2.5 mL 11  . ondansetron (ZOFRAN) 8 MG tablet TAKE 1 TABLET (8 MG TOTAL) BY MOUTH 2 (TWO) TIMES DAILY AS NEEDED FOR NAUSEA OR VOMITING. 30 tablet 2  . predniSONE (DELTASONE) 50 MG tablet Take 50 mg by mouth See admin instructions. Take 1 tablet (50 mg) 13 hours before CT scan, 7 hours before CT scan and 1 hour before CT scan (every 9 weeks)     No current facility-administered medications for this visit.    SURGICAL HISTORY:  Past Surgical History  Procedure Laterality Date  . Cervical laminectomy  1995  . Other surgical history  2008    Gamma knife surgery to remove brain met   . 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 Knee surgery  1990    Left x 2  . Portacath placement  -[name]    TIP IN CAVOATRIAL JUNCTION  . Loop recorder implant N/A 04/19/2014    Procedure: LOOP RECORDER IMPLANT;  Surgeon: Thompson Grayer, MD;  Location: [facility] CATH LAB;  Service: Cardiovascular;  Laterality: N/A;  . Tee without cardioversion N/A 04/19/2014    Procedure: TRANSESOPHAGEAL ECHOCARDIOGRAM (TEE);  Surgeon: Larey Dresser, MD;  Location: [place];  Service: Cardiovascular;  Laterality: N/A;    REVIEW OF SYSTEMS:  A comprehensive review of systems was negative except for: Musculoskeletal: positive for back pain   PHYSICAL EXAMINATION: General appearance: alert, cooperative and no distress Head:  Normocephalic, without obvious abnormality, atraumatic Neck: no adenopathy, no JVD, supple, symmetrical, trachea midline and thyroid not enlarged, symmetric, no tenderness/mass/nodules Lymph nodes: Cervical, supraclavicular, and axillary nodes normal. Resp: clear to auscultation bilaterally Back: symmetric, no curvature. ROM normal. No CVA tenderness. Cardio: regular rate and rhythm, S1, S2 normal, no murmur, click, rub or gallop GI: soft, non-tender; bowel sounds normal; no masses,  no organomegaly Extremities: extremities normal, atraumatic, no cyanosis or edema Neurologic: Alert and oriented X 3, normal strength and tone. Normal symmetric reflexes. Normal coordination and gait  ECOG PERFORMANCE STATUS: 0 - Asymptomatic  Blood pressure 125/67, pulse 80, temperature 98.1 F (36.7 C), temperature source Oral, resp. rate 18, height _0  (1.6 m), weight 149 lb 4.8 oz (67.722 kg), SpO2 100 %.  LABORATORY DATA: Lab Results  Component Value Date   WBC 9.3 10/18/2014   HGB 11.5* 10/18/2014   HCT 35.3 10/18/2014   MCV 90.3 10/18/2014   PLT 437* 10/18/2014  Chemistry      Component Value Date/Time   NA 144 10/18/2014 1023   NA 135 04/26/2014 1205   K 4.9 10/18/2014 1023   K 4.2 04/26/2014 1205   CL 103 04/26/2014 1205   CL 106 06/30/2012 1004   CO2 26 10/18/2014 1023   CO2 29 04/26/2014 1205   BUN 13.7 10/18/2014 1023   BUN 17 04/26/2014 1205   CREATININE 0.9 10/18/2014 1023   CREATININE 0.67 04/26/2014 1205      Component Value Date/Time   CALCIUM 10.0 10/18/2014 1023   CALCIUM 9.5 04/26/2014 1205   ALKPHOS 102 10/18/2014 1023   ALKPHOS 57 04/26/2014 1205   AST 20 10/18/2014 1023   AST 15 04/26/2014 1205   ALT 10 10/18/2014 1023   ALT 10 04/26/2014 1205   BILITOT 0.37 10/18/2014 1023   BILITOT 0.4 04/26/2014 1205       RADIOGRAPHIC STUDIES:  ASSESSMENT AND PLAN: This is a very pleasant 64 years old white female with metastatic non-small cell lung cancer  currently undergoing maintenance chemotherapy with single agent Alimta status post 123 cycles. The patient is tolerating her treatment fairly well with no significant adverse effects.  I recommended for the patient to proceed with her systemic therapy today as scheduled. She will receive cycle #124 today. The patient would come back for followup visit in 3 weeks for evaluation before starting cycle #125. For pain management, she will continue on her current pain medication with MS Contin and MS IR. She was giving refill of MS Contin today. She was advised to call immediately if she has any concerning symptoms in the interval. The patient voices understanding of current disease status and treatment options and is in agreement with the current care plan.  All questions were answered. The patient knows to call the clinic with any problems, questions or concerns. We can certainly see the patient much sooner if necessary.  Disclaimer: This note was dictated with voice recognition software. Similar sounding words can inadvertently be transcribed and may not be corrected upon review.

## 2014-10-18 NOTE — Progress Notes (Signed)
LOOP RECORDER  

## 2014-10-24 ENCOUNTER — Telehealth: Payer: Self-pay | Admitting: *Deleted

## 2014-10-24 NOTE — Telephone Encounter (Signed)
Oncology Nurse Navigator Documentation  Oncology Nurse Navigator Flowsheets 10/24/2014  Navigator Encounter Type Telephone/per Dr. Julien Nordmann I called and asked patient if she would like to talk about her story to news 12.  She stated she would.  I gave her time and date   Patient Visit Type Follow-up  Treatment Phase Treatment  Time Spent with Patient 15

## 2014-10-25 LAB — CUP PACEART REMOTE DEVICE CHECK: Date Time Interrogation Session: 20161009190559

## 2014-10-25 NOTE — Progress Notes (Signed)
Carelink summary report received. Battery status OK. Normal device function. No new symptom episodes, tachy episodes, brady, or pause episodes. No new AF episodes. Monthly summary reports and ROV with JA PRN. 

## 2014-10-27 ENCOUNTER — Other Ambulatory Visit: Payer: Self-pay | Admitting: Family Medicine

## 2014-10-31 ENCOUNTER — Encounter: Payer: Self-pay | Admitting: Internal Medicine

## 2014-11-02 ENCOUNTER — Telehealth: Payer: Self-pay | Admitting: *Deleted

## 2014-11-02 DIAGNOSIS — C3410 Malignant neoplasm of upper lobe, unspecified bronchus or lung: Secondary | ICD-10-CM

## 2014-11-02 DIAGNOSIS — G4700 Insomnia, unspecified: Secondary | ICD-10-CM

## 2014-11-02 NOTE — Telephone Encounter (Signed)
Patient called requesting refill for Xanax 0.25 mg take one po qhs, qty 30 last filled 09-27-2014.  [facility] [place]

## 2014-11-03 MED ORDER — ALPRAZOLAM 0.25 MG PO TABS: 0.2500 mg | ORAL_TABLET | Freq: Every evening | ORAL | Status: DC | PRN

## 2014-11-04 ENCOUNTER — Other Ambulatory Visit: Payer: Self-pay | Admitting: Internal Medicine

## 2014-11-07 ENCOUNTER — Encounter: Payer: Self-pay | Admitting: Neurology

## 2014-11-07 ENCOUNTER — Other Ambulatory Visit: Payer: Self-pay | Admitting: Medical Oncology

## 2014-11-07 ENCOUNTER — Ambulatory Visit (INDEPENDENT_AMBULATORY_CARE_PROVIDER_SITE_OTHER): Payer: Medicare Other | Admitting: Neurology

## 2014-11-07 VITALS — BP 109/65 | HR 81 | Ht 63.0 in | Wt 146.4 lb

## 2014-11-07 DIAGNOSIS — I639 Cerebral infarction, unspecified: Secondary | ICD-10-CM | POA: Diagnosis not present

## 2014-11-07 DIAGNOSIS — I63412 Cerebral infarction due to embolism of left middle cerebral artery: Secondary | ICD-10-CM | POA: Diagnosis not present

## 2014-11-07 DIAGNOSIS — C3490 Malignant neoplasm of unspecified part of unspecified bronchus or lung: Secondary | ICD-10-CM | POA: Diagnosis not present

## 2014-11-07 DIAGNOSIS — E785 Hyperlipidemia, unspecified: Secondary | ICD-10-CM | POA: Diagnosis not present

## 2014-11-07 NOTE — Patient Instructions (Addendum)
-   continue ASA and lipitor for stroke prevention - continue to monitor loop recorder - follow up with oncologist - Follow up with your primary care physician for stroke risk factor modification. Recommend maintain blood pressure goal <130/80, diabetes with hemoglobin A1c goal below 6.5% and lipids with LDL cholesterol goal below 70 mg/dL.  - check BP at home - follow up in 6 months.

## 2014-11-07 NOTE — Progress Notes (Signed)
STROKE NEUROLOGY FOLLOW UP NOTE  NAME: Danielle [name] DOB: 03-26-1950  REASON FOR VISIT: stroke follow up HISTORY FROM: pt and chart  Today we had the pleasure of seeing ANNTONETTE [name] in follow-up at our Neurology Clinic. Pt was accompanied by daughter.   History Summary [name] is a 64 y.o. 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 Although hypercoagulable state due to malignancy is a possibility, however, after discuss with her oncologist Dr. Rogue Jury, it was felt less likely. Stroke work up including TTE, CUS, TEE, DVT not revealing, therefore Loop recorder placed. EEG showed bifrontal sharps, so vimpat was started. LDL 105, switched from zocor to lipitor. She was d/c to home with ASA and lipitor and outpt speech therapy.  07/22/14 follow up - the patient has been doing well, language much improved and near at baseline now. She followed up with PCP and vimpat was discontinued with unknown reason. She continues to follow up with oncology for maintenance chemo. Her BP 99/66 but she is on norvasc and metoprolol. She is asking for driving.   Interval History During the interval time, pt has been doing well. Language normal now. Back to driving, denies any problem with driving. Loop recorder no afib events. EEG repeat was normal. She follows with oncology for routine maintenance infusion. Had recent MRI brain showed stable, no concern of brain mets relapse. BP stable today 109/65.  REVIEW OF SYSTEMS: Full 14 system review of systems performed and notable only for those listed below and in HPI above, all others are negative:  Constitutional:   Cardiovascular:  Ear/Nose/Throat:   Skin:  Eyes:  Eye  discharge Respiratory:   Gastroitestinal:   Genitourinary:  Hematology/Lymphatic:   Endocrine:  Musculoskeletal:   Allergy/Immunology:   Neurological:   Psychiatric:  Sleep:   The following represents the patient's updated allergies and side effects list: Allergies  Allergen Reactions  . Contrast Media [Iodinated Diagnostic Agents] Hives  . Iohexol Hives     pt needs 13 hour prep--prev hives; added allergy 06/02/07 slg   . Sulfa Drugs Cross Reactors Hives  . Co-Trimoxazole Injection [Sulfamethoxazole-Trimethoprim] Rash    The neurologically relevant items on the patient's problem list were reviewed on today's visit.  Neurologic Examination  A problem focused neurological exam (12 or more points of the single system neurologic examination, vital signs counts as 1 point, cranial nerves count for 8 points) was performed.  Blood pressure 109/65, pulse 81, height '5\' 3"'$  (1.6 m), weight 146 lb 6.4 oz (66.407 kg).  General - Well nourished, well developed, in no apparent distress.  Ophthalmologic - Fundi not visualized due to eye movement.  Cardiovascular - Regular rate and rhythm with no murmur.  Mental Status -  Level of arousal and orientation to time, place, and person were intact. Language including expression, name, repetition, comprehension was assessed and found intact.  Cranial Nerves II - XII - II - Visual field intact OU. III, IV, VI - Extraocular movements intact. V - Facial sensation intact bilaterally. VII - Facial movement intact bilaterally. VIII - Hearing & vestibular intact bilaterally. X - Palate elevates symmetrically. XI - Chin turning & shoulder shrug intact bilaterally. XII - Tongue protrusion intact.  Motor Strength - The patient's strength  was normal in all extremities and pronator drift was absent.  Bulk was normal and fasciculations were absent.   Motor Tone - Muscle tone was assessed at the neck and appendages and was normal.  Reflexes - The  patient's reflexes were 1+ in all extremities and she had no pathological reflexes.  Sensory - Light touch, temperature/pinprick, vibration and proprioception, and Romberg testing were assessed and were normal.    Coordination - The patient had normal movements in the hands and feet with no ataxia or dysmetria.  Tremor was absent.  Gait and Station - The patient's transfers, posture, gait, station, and turns were observed as normal.  Data reviewed: I personally reviewed the images and agree with the radiology interpretations.  Dg Chest 2 View  04/18/2014 IMPRESSION: No active cardiopulmonary disease.   Ct Head (brain) Wo Contrast  04/16/2014 IMPRESSION: Subtle area of low attenuation in the left frontal lobe. This could represent focal ischemia or metastasis. Suggest MRI for further evaluation. No acute intracranial hemorrhage. No significant mass effect.   Mr Danielle [name] Wo Contrast  04/17/2014 IMPRESSION: Moderate-size acute hemorrhagic anterior left frontal lobe infarct with extension to the anterior left peri operculum region and subinsular region. Mild local mass effect. Small acute scattered right parietal lobe infarcts. Remote small right lenticular nucleus infarct. No intracranial enhancing lesion or bony destructive lesion to suggest the presence of intracranial metastatic disease. Paranasal sinus mucosal thickening/ partial opacification most notable maxillary sinuses, ethmoid sinus air cells and right sphenoid sinus.   US Renal  04/17/2014 IMPRESSION: Normal renal ultrasound. No evidence of hydronephrosis. Decompressed bladder.   Mra Head/brain Wo Cm  04/18/2014 IMPRESSION: No large vessel occlusion. Decreased number of distal LEFT middle cerebral artery branches. LEFT frontal lobe hemorrhage versus enhancement partially imaged.    Carotid Doppler Bilateral: 1-39% ICA stenosis. Vertebral artery flow is antegrade.  2D Echocardiogram - Left ventricle: The  cavity size was normal. Wall thickness was normal. Systolic function was normal. The estimated ejection fraction was in the range of 55% to 60%. Wall motion was normal; there were no regional wall motion abnormalities. Left ventricular diastolic function parameters were normal.  UE and LE venous Doppler - negative for DVT  EEG - This is an abnormal electroencephalogram secondary to slowing and sharp activity over the left frontotemporal region consistent with the patient's history of a frontal lobe hemorrhage. Also noted were periods of rhythmical bifrontal sharp activity that are concerning for electrographic and electroclinical seizure activity.   EEG repeat 08/01/14 - normal EEG in the waking state. No evidence of ictal or interictal discharges are seen.  TEE Normal LV size and systolic function, EF 02-40%. Normal regional wall motion. Normal RV size and systolic function. Trivial MR. Trivial TR, peak RV-RA gradient 37 mmHg. Trileaflet aortic valve with no AS or AI. Normal atrial sizes with no LAA thrombus. Catheter ends in right atrium, no clot or vegetation. Negative bubble study, no evidence for PFO or ASD. Normal caliber aorta, grade II plaque descending thoracic aorta.  Component     Latest Ref Rng 04/18/2014  Cholesterol     0 - 200 mg/dL 173  Triglycerides     <150 mg/dL 116  HDL Cholesterol     >39 mg/dL 45  Total CHOL/HDL Ratio      3.8  VLDL     0 - 40 mg/dL 23  LDL (calc)     0 - 99 mg/dL 105 (H)  Hemoglobin A1C     4.8 -  5.6 % 6.1 (H)  Mean Plasma Glucose      128    Assessment: As you may recall, she is a 64 y.o. 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 Although hypercoagulable state  due to malignancy is a possibility, however, after discuss with her oncologist Dr. Rogue Jury, it was felt less likely. Stroke work up including TTE, CUS, TEE, DVT not revealing, therefore Loop recorder placed. EEG showed bifrontal sharps, so vimpat was started. She was d/c to home with ASA and lipitor. Overtime, language much improved and now normal. Loop recorder no afib so far. However, her vimpat was d/c with unclear reason. She has no seizure activity. Repeat EEG showed normal EEG. Back to driving.   Plan:   continue ASA and lipitor for stroke prevention - continue to monitor loop recorder - follow up with oncologist regularly  - Follow up with your primary care physician for stroke risk factor modification. Recommend maintain blood pressure goal <130/80, diabetes with hemoglobin A1c goal below 6.5% and lipids with LDL cholesterol goal below 70 mg/dL.  - check BP at home - follow up in 6 months.  No orders of the defined types were placed in this encounter.    No orders of the defined types were placed in this encounter.    Patient Instructions  - continue ASA and lipitor for stroke prevention - continue to monitor loop recorder - follow up with oncologist - Follow up with your primary care physician for stroke risk factor modification. Recommend maintain blood pressure goal <130/80, diabetes with hemoglobin A1c goal below 6.5% and lipids with LDL cholesterol goal below 70 mg/dL.  - check BP at home - follow up in 6 months.    Rosalin Hawking, MD PhD [facility] [address] [place],  [address] [phone]

## 2014-11-07 NOTE — Progress Notes (Signed)
called in xanax

## 2014-11-08 ENCOUNTER — Telehealth: Payer: Self-pay | Admitting: Internal Medicine

## 2014-11-08 ENCOUNTER — Ambulatory Visit (HOSPITAL_BASED_OUTPATIENT_CLINIC_OR_DEPARTMENT_OTHER): Payer: Medicare Other

## 2014-11-08 ENCOUNTER — Encounter: Payer: Self-pay | Admitting: Internal Medicine

## 2014-11-08 ENCOUNTER — Other Ambulatory Visit (HOSPITAL_BASED_OUTPATIENT_CLINIC_OR_DEPARTMENT_OTHER): Payer: Medicare Other

## 2014-11-08 ENCOUNTER — Ambulatory Visit (HOSPITAL_BASED_OUTPATIENT_CLINIC_OR_DEPARTMENT_OTHER): Payer: Medicare Other | Admitting: Internal Medicine

## 2014-11-08 VITALS — BP 117/61 | HR 96 | Temp 98.1°F | Resp 17 | Ht 63.0 in | Wt 148.9 lb

## 2014-11-08 DIAGNOSIS — C7931 Secondary malignant neoplasm of brain: Secondary | ICD-10-CM

## 2014-11-08 DIAGNOSIS — C3490 Malignant neoplasm of unspecified part of unspecified bronchus or lung: Secondary | ICD-10-CM

## 2014-11-08 DIAGNOSIS — C7989 Secondary malignant neoplasm of other specified sites: Secondary | ICD-10-CM | POA: Diagnosis not present

## 2014-11-08 DIAGNOSIS — C3411 Malignant neoplasm of upper lobe, right bronchus or lung: Secondary | ICD-10-CM

## 2014-11-08 DIAGNOSIS — Z5111 Encounter for antineoplastic chemotherapy: Secondary | ICD-10-CM | POA: Diagnosis not present

## 2014-11-08 LAB — COMPREHENSIVE METABOLIC PANEL (CC13)
ALT: 12 U/L (ref 0–55)
AST: 18 U/L (ref 5–34)
Albumin: 3.2 g/dL — ABNORMAL LOW (ref 3.5–5.0)
Alkaline Phosphatase: 82 U/L (ref 40–150)
Anion Gap: 10 mEq/L (ref 3–11)
BUN: 17.9 mg/dL (ref 7.0–26.0)
CHLORIDE: 107 meq/L (ref 98–109)
CO2: 25 meq/L (ref 22–29)
CREATININE: 1.3 mg/dL — AB (ref 0.6–1.1)
Calcium: 9.8 mg/dL (ref 8.4–10.4)
EGFR: 44 mL/min/{1.73_m2} — ABNORMAL LOW (ref >90)
Glucose: 170 mg/dl — ABNORMAL HIGH (ref 70–140)
POTASSIUM: 4.7 meq/L (ref 3.5–5.1)
Sodium: 143 mEq/L (ref 136–145)
Total Bilirubin: <0.3 mg/dL (ref 0.20–1.20)
Total Protein: 6.5 g/dL (ref 6.4–8.3)

## 2014-11-08 LAB — CBC WITH DIFFERENTIAL/PLATELET
BASO%: 0.5 % (ref 0.0–2.0)
Basophils Absolute: 0 10*3/uL (ref 0.0–0.1)
EOS%: 1.7 % (ref 0.0–7.0)
Eosinophils Absolute: 0.1 10*3/uL (ref 0.0–0.5)
HCT: 34.1 % — ABNORMAL LOW (ref 34.8–46.6)
HGB: 11 g/dL — ABNORMAL LOW (ref 11.6–15.9)
LYMPH#: 2 10*3/uL (ref 0.9–3.3)
LYMPH%: 23.7 % (ref 14.0–49.7)
MCH: 29.4 pg (ref 25.1–34.0)
MCHC: 32.4 g/dL (ref 31.5–36.0)
MCV: 90.7 fL (ref 79.5–101.0)
MONO#: 1 10*3/uL — ABNORMAL HIGH (ref 0.1–0.9)
MONO%: 11.9 % (ref 0.0–14.0)
NEUT#: 5.3 10*3/uL (ref 1.5–6.5)
NEUT%: 62.2 % (ref 38.4–76.8)
Platelets: 422 10*3/uL — ABNORMAL HIGH (ref 145–400)
RBC: 3.76 10*6/uL (ref 3.70–5.45)
RDW: 15.9 % — ABNORMAL HIGH (ref 11.2–14.5)
WBC: 8.5 10*3/uL (ref 3.9–10.3)

## 2014-11-08 MED ORDER — SODIUM CHLORIDE 0.9 % IV SOLN
500.0000 mg/m2 | Freq: Once | INTRAVENOUS | Status: AC
  Administered 2014-11-08: 900 mg via INTRAVENOUS
  Filled 2014-11-08: qty 8

## 2014-11-08 MED ORDER — HEPARIN SOD (PORK) LOCK FLUSH 100 UNIT/ML IV SOLN
500.0000 [IU] | Freq: Once | INTRAVENOUS | Status: AC | PRN
Start: 2014-11-08 — End: 2014-11-08
  Administered 2014-11-08: 500 [IU]
  Filled 2014-11-08: qty 5

## 2014-11-08 MED ORDER — SODIUM CHLORIDE 0.9 % IJ SOLN
10.0000 mL | INTRAMUSCULAR | Status: DC | PRN
  Administered 2014-11-08: 10 mL
  Filled 2014-11-08: qty 10

## 2014-11-08 MED ORDER — SODIUM CHLORIDE 0.9 % IV SOLN
Freq: Once | INTRAVENOUS | Status: AC
  Administered 2014-11-08: 09:00:00 via INTRAVENOUS

## 2014-11-08 MED ORDER — SODIUM CHLORIDE 0.9 % IV SOLN
Freq: Once | INTRAVENOUS | Status: AC
  Administered 2014-11-08: 09:00:00 via INTRAVENOUS
  Filled 2014-11-08: qty 4

## 2014-11-08 NOTE — Progress Notes (Signed)
[facility] [phone]   [phone]  OFFICE PROGRESS NOTE  [name], MD [place] [address]  Diagnosis:  Metastatic non-small cell lung cancer initially diagnosed with locally advanced stage IIIB with right Pancoast tumor involving the vertebral body as well as the foraminal canal invasion with spinal cord compression in October of 2007.   Prior Therapy:  1. Status post concurrent chemoradiation with weekly carboplatin and paclitaxel, last dose was given November 18, 2005. 2. 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 3. Status post gamma knife radiotherapy to a solitary brain lesion located in the superior frontal area of the brain at [facility] in April of 2008. 4. Status post palliative radiotherapy to the lateral abdominal wall metastatic lesion under the care of Dr. Lisbeth Renshaw, completed March of 2009. 5. Status post 6 cycles of systemic chemotherapy with carboplatin and Alimta. Last dose was given July 26, 2007 with disease stabilization. 6. Gamma knife stereotactic radiotherapy to 2 brain lesions one involving the right frontal dural based as well as right parietal lesion performed on 05/07/2012 under the care of Dr. Vallarie Mare at [facility].  Current Therapy:  Maintenance chemotherapy with Alimta 500 mg per meter squared given every 3 weeks. The patient is status post 124 cycles.   CHEMOTHERAPY INTENT: Palliative/maintenance  CURRENT # OF CHEMOTHERAPY CYCLES: 125 CURRENT ANTIEMETICS: Zofran, dexamethasone and Compazine  CURRENT SMOKING STATUS: Quit smoking 10/07/2005  ORAL CHEMOTHERAPY AND CONSENT: None  CURRENT BISPHOSPHONATES USE: None  PAIN MANAGEMENT: 0/10 currently on morphine  NARCOTICS INDUCED CONSTIPATION: No constipation  LIVING WILL AND CODE STATUS: No CODE BLUE  INTERVAL HISTORY: Danielle [name] 64 y.o. female  returns to the clinic today for followup visit accompanied by her sister. The patient is feeling fine today with no specific complaints. She was seen recently by neurology for follow-up of her previous stroke and she was giving good report.. She is tolerating her maintenance treatment with single agent Alimta fairly well with no significant adverse effects. The patient denied having any chest pain, shortness of breath, cough or hemoptysis. No nausea or vomiting. The patient denied having any weight loss or night sweats. She is here today to start cycle #125 of her maintenance treatment with single agent Alimta.  MEDICAL HISTORY: Past Medical History  Diagnosis Date  . Hypertension   . Anemia   . H/O: pneumonia   . History of tobacco abuse quit 10/08    on nicotine patch  . Fibromyalgia   . Hypokalemia   . DJD (degenerative joint disease), cervical   . Thrush 12/11/2010  . Lung cancer ([facility]) dx'd 09/2005    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 had chemo and radiation for lung ca  . CVA (cerebral vascular accident) ([facility]) 04/2014    pt states had 2 cva's within 2 wks    ALLERGIES:  is allergic to contrast media; iohexol; sulfa drugs cross reactors; and co-trimoxazole injection.  MEDICATIONS:  Current Outpatient Prescriptions  Medication Sig Dispense Refill  . ALPRAZolam (XANAX) 0.25 MG tablet TAKE 1 TABLET BY MOUTH AT BEDTIME AS NEEDED FOR SLEEP 30 tablet 0  . amLODipine (NORVASC) 10 MG tablet TAKE 1 TABLET (10 MG TOTAL) BY MOUTH DAILY. 90 tablet 1  . aspirin EC 325 MG tablet TAKE 1 TABLET (325 MG TOTAL) BY MOUTH DAILY. 30 tablet 0  . atorvastatin (LIPITOR) 40 MG tablet Take 1  tablet (40 mg total) by mouth daily at 6 PM. 90 tablet 3  . fluticasone (FLONASE) 50 MCG/ACT nasal spray USE 1-2 SPRAYS IN EACH NOSTRIL DAILY (Patient taking differently: USE 1-2 SPRAYS IN EACH NOSTRIL DAILY PRN CONGESTION) 16 g 11  . folic acid (FOLVITE) 1 MG tablet TAKE 1 TABLET BY MOUTH DAILY. 90 tablet 1  .  furosemide (LASIX) 20 MG tablet TAKE 1 TABLET (20 MG TOTAL) BY MOUTH DAILY AS NEEDED FOR EDEMA. 10 tablet 0  . lidocaine-prilocaine (EMLA) cream Apply to Port-A-Cath as directed 30 g 1  . lisinopril (PRINIVIL,ZESTRIL) 20 MG tablet TAKE 2 TABLETS (40 MG TOTAL) BY MOUTH DAILY. 60 tablet 5  . morphine (MS CONTIN) 60 MG 12 hr tablet Take 1 tablet (60 mg total) by mouth 2 (two) times daily. 60 tablet 0  . morphine (MSIR) 30 MG tablet Take 1 tablet (30 mg total) by mouth every 4 (four) hours as needed (breakthrough pain). 60 tablet 0  . Olopatadine HCl 0.2 % SOLN Apply 1 drop to eye daily. 2.5 mL 11  . ondansetron (ZOFRAN) 8 MG tablet TAKE 1 TABLET (8 MG TOTAL) BY MOUTH 2 (TWO) TIMES DAILY AS NEEDED FOR NAUSEA OR VOMITING. 30 tablet 2  . predniSONE (DELTASONE) 50 MG tablet Take 50 mg by mouth See admin instructions. Take 1 tablet (50 mg) 13 hours before CT scan, 7 hours before CT scan and 1 hour before CT scan (every 9 weeks)     No current facility-administered medications for this visit.    SURGICAL HISTORY:  Past Surgical History  Procedure Laterality Date  . Cervical laminectomy  1995  . Other surgical history  2008    Gamma knife surgery to remove brain met   . 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 Knee surgery  1990    Left x 2  . Portacath placement  -[name]    TIP IN CAVOATRIAL JUNCTION  . Loop recorder implant N/A 04/19/2014    Procedure: LOOP RECORDER IMPLANT;  Surgeon: Thompson Grayer, MD;  Location: [facility] CATH LAB;  Service: Cardiovascular;  Laterality: N/A;  . Tee without cardioversion N/A 04/19/2014    Procedure: TRANSESOPHAGEAL ECHOCARDIOGRAM (TEE);  Surgeon: Larey Dresser, MD;  Location: [facility] ENDOSCOPY;  Service: Cardiovascular;  Laterality: N/A;    REVIEW OF SYSTEMS:  A comprehensive review of systems was negative.   PHYSICAL EXAMINATION: General appearance: alert, cooperative and no distress Head: Normocephalic, without obvious abnormality,  atraumatic Neck: no adenopathy, no JVD, supple, symmetrical, trachea midline and thyroid not enlarged, symmetric, no tenderness/mass/nodules Lymph nodes: Cervical, supraclavicular, and axillary nodes normal. Resp: clear to auscultation bilaterally Back: symmetric, no curvature. ROM normal. No CVA tenderness. Cardio: regular rate and rhythm, S1, S2 normal, no murmur, click, rub or gallop GI: soft, non-tender; bowel sounds normal; no masses,  no organomegaly Extremities: extremities normal, atraumatic, no cyanosis or edema Neurologic: Alert and oriented X 3, normal strength and tone. Normal symmetric reflexes. Normal coordination and gait  ECOG PERFORMANCE STATUS: 0 - Asymptomatic  Blood pressure 117/61, pulse 96, temperature 98.1 F (36.7 C), temperature source Oral, resp. rate 17, height '5\' 3"'  (1.6 m), weight 148 lb 14.4 oz (67.541 kg), SpO2 98 %.  LABORATORY DATA: Lab Results  Component Value Date   WBC 8.5 11/08/2014   HGB 11.0* 11/08/2014   HCT 34.1* 11/08/2014   MCV 90.7 11/08/2014   PLT 422* 11/08/2014      Chemistry  Component Value Date/Time   NA 144 10/18/2014 1023   NA 135 04/26/2014 1205   K 4.9 10/18/2014 1023   K 4.2 04/26/2014 1205   CL 103 04/26/2014 1205   CL 106 06/30/2012 1004   CO2 26 10/18/2014 1023   CO2 29 04/26/2014 1205   BUN 13.7 10/18/2014 1023   BUN 17 04/26/2014 1205   CREATININE 0.9 10/18/2014 1023   CREATININE 0.67 04/26/2014 1205      Component Value Date/Time   CALCIUM 10.0 10/18/2014 1023   CALCIUM 9.5 04/26/2014 1205   ALKPHOS 102 10/18/2014 1023   ALKPHOS 57 04/26/2014 1205   AST 20 10/18/2014 1023   AST 15 04/26/2014 1205   ALT 10 10/18/2014 1023   ALT 10 04/26/2014 1205   BILITOT 0.37 10/18/2014 1023   BILITOT 0.4 04/26/2014 1205       RADIOGRAPHIC STUDIES:  ASSESSMENT AND PLAN: This is a very pleasant 64 years old white female with metastatic non-small cell lung cancer currently undergoing maintenance chemotherapy with  single agent Alimta status post 124 cycles. The patient is tolerating her treatment fairly well with no significant adverse effects.  I recommended for the patient to proceed with her systemic therapy today as scheduled. She will receive cycle #125 today. The patient would come back for followup visit in 3 weeks for evaluation before starting cycle #126. For pain management, she will continue on her current pain medication with MS Contin and MS IR.  She was advised to call immediately if she has any concerning symptoms in the interval. The patient voices understanding of current disease status and treatment options and is in agreement with the current care plan.  All questions were answered. The patient knows to call the clinic with any problems, questions or concerns. We can certainly see the patient much sooner if necessary.  Disclaimer: This note was dictated with voice recognition software. Similar sounding words can inadvertently be transcribed and may not be corrected upon review.

## 2014-11-08 NOTE — Telephone Encounter (Signed)
Gave and pritned pt appt sched for NOV and DEc

## 2014-11-08 NOTE — Patient Instructions (Signed)
[facility] Discharge Instructions for Patients Receiving Chemotherapy  Today you received the following chemotherapy agents Alimta.  To help prevent nausea and vomiting after your treatment, we encourage you to take your nausea medication as prescribed.   If you develop nausea and vomiting that is not controlled by your nausea medication, call the clinic.   BELOW ARE SYMPTOMS THAT SHOULD BE REPORTED IMMEDIATELY:  *FEVER GREATER THAN 100.5 F  *CHILLS WITH OR WITHOUT FEVER  NAUSEA AND VOMITING THAT IS NOT CONTROLLED WITH YOUR NAUSEA MEDICATION  *UNUSUAL SHORTNESS OF BREATH  *UNUSUAL BRUISING OR BLEEDING  TENDERNESS IN MOUTH AND THROAT WITH OR WITHOUT PRESENCE OF ULCERS  *URINARY PROBLEMS  *BOWEL PROBLEMS  UNUSUAL RASH Items with * indicate a potential emergency and should be followed up as soon as possible.  Feel free to call the clinic you have any questions or concerns. The clinic phone number is [phone].  Please show the CHEMO ALERT CARD at check-in to the Emergency Department and triage nurse.   

## 2014-11-10 ENCOUNTER — Other Ambulatory Visit: Payer: Self-pay | Admitting: *Deleted

## 2014-11-10 ENCOUNTER — Telehealth: Payer: Self-pay | Admitting: *Deleted

## 2014-11-10 DIAGNOSIS — C3411 Malignant neoplasm of upper lobe, right bronchus or lung: Secondary | ICD-10-CM

## 2014-11-10 NOTE — Telephone Encounter (Signed)
Oncology Nurse Navigator Documentation  Oncology Nurse Navigator Flowsheets 11/10/2014  Navigator Encounter Type Telephone/I received notification from marketing the interview on 11/14/14 has been cancelled.  I called patient and updated her.  She verbalized understanding.    Patient Visit Type Follow-up  Treatment Phase Treatment  Time Spent with Patient 15

## 2014-11-11 ENCOUNTER — Other Ambulatory Visit: Payer: Self-pay | Admitting: Internal Medicine

## 2014-11-15 ENCOUNTER — Other Ambulatory Visit: Payer: Self-pay | Admitting: *Deleted

## 2014-11-15 ENCOUNTER — Ambulatory Visit (INDEPENDENT_AMBULATORY_CARE_PROVIDER_SITE_OTHER): Payer: Medicare Other | Admitting: *Deleted

## 2014-11-15 DIAGNOSIS — C3410 Malignant neoplasm of upper lobe, unspecified bronchus or lung: Secondary | ICD-10-CM

## 2014-11-15 DIAGNOSIS — I639 Cerebral infarction, unspecified: Secondary | ICD-10-CM

## 2014-11-15 DIAGNOSIS — C3490 Malignant neoplasm of unspecified part of unspecified bronchus or lung: Secondary | ICD-10-CM

## 2014-11-15 MED ORDER — MORPHINE SULFATE ER 60 MG PO TBCR: 60.0000 mg | EXTENDED_RELEASE_TABLET | Freq: Two times a day (BID) | ORAL | Status: DC

## 2014-11-15 MED ORDER — MORPHINE SULFATE 30 MG PO TABS: 30.0000 mg | ORAL_TABLET | ORAL | Status: DC | PRN

## 2014-11-15 NOTE — Addendum Note (Signed)
Addended by: Lucile Crater on: 11/15/2014 11:22 AM   Modules accepted: Orders

## 2014-11-15 NOTE — Telephone Encounter (Signed)
Pt called requesting refills of MS Contin 60 mg  And  MS IR 30 mg.   Pt stated she will be out of pain meds in 2 days. Pt's   Phone    [phone].

## 2014-11-15 NOTE — Addendum Note (Signed)
Addended by: Lucile Crater on: 11/15/2014 12:00 PM   Modules accepted: Orders

## 2014-11-15 NOTE — Telephone Encounter (Signed)
Refill reviewed with MD. Notified pt she may pick up on 11/10

## 2014-11-16 NOTE — Progress Notes (Signed)
[facility] Summary Report / Loop recorder

## 2014-11-18 ENCOUNTER — Encounter: Payer: Self-pay | Admitting: Internal Medicine

## 2014-11-29 ENCOUNTER — Ambulatory Visit (HOSPITAL_BASED_OUTPATIENT_CLINIC_OR_DEPARTMENT_OTHER): Payer: Medicare Other

## 2014-11-29 ENCOUNTER — Encounter: Payer: Self-pay | Admitting: Internal Medicine

## 2014-11-29 ENCOUNTER — Other Ambulatory Visit (HOSPITAL_BASED_OUTPATIENT_CLINIC_OR_DEPARTMENT_OTHER): Payer: Medicare Other

## 2014-11-29 ENCOUNTER — Encounter: Payer: Self-pay | Admitting: Oncology

## 2014-11-29 ENCOUNTER — Ambulatory Visit (HOSPITAL_BASED_OUTPATIENT_CLINIC_OR_DEPARTMENT_OTHER): Payer: Medicare Other | Admitting: Internal Medicine

## 2014-11-29 VITALS — BP 121/53 | HR 85 | Temp 98.4°F | Resp 18 | Ht 63.0 in | Wt 148.0 lb

## 2014-11-29 DIAGNOSIS — C3411 Malignant neoplasm of upper lobe, right bronchus or lung: Secondary | ICD-10-CM

## 2014-11-29 DIAGNOSIS — Z5111 Encounter for antineoplastic chemotherapy: Secondary | ICD-10-CM

## 2014-11-29 DIAGNOSIS — C7931 Secondary malignant neoplasm of brain: Secondary | ICD-10-CM | POA: Diagnosis not present

## 2014-11-29 DIAGNOSIS — C3491 Malignant neoplasm of unspecified part of right bronchus or lung: Secondary | ICD-10-CM

## 2014-11-29 DIAGNOSIS — C3490 Malignant neoplasm of unspecified part of unspecified bronchus or lung: Secondary | ICD-10-CM

## 2014-11-29 DIAGNOSIS — C792 Secondary malignant neoplasm of skin: Secondary | ICD-10-CM | POA: Diagnosis not present

## 2014-11-29 LAB — COMPREHENSIVE METABOLIC PANEL (CC13)
ALBUMIN: 3.3 g/dL — AB (ref 3.5–5.0)
ALK PHOS: 90 U/L (ref 40–150)
ALT: 12 U/L (ref 0–55)
AST: 17 U/L (ref 5–34)
Anion Gap: 9 mEq/L (ref 3–11)
BUN: 17.4 mg/dL (ref 7.0–26.0)
CO2: 28 mEq/L (ref 22–29)
Calcium: 9.9 mg/dL (ref 8.4–10.4)
Chloride: 102 mEq/L (ref 98–109)
Creatinine: 0.9 mg/dL (ref 0.6–1.1)
EGFR: 71 mL/min/{1.73_m2} — ABNORMAL LOW (ref >90)
GLUCOSE: 132 mg/dL (ref 70–140)
Potassium: 4.5 mEq/L (ref 3.5–5.1)
SODIUM: 139 meq/L (ref 136–145)
TOTAL PROTEIN: 6.7 g/dL (ref 6.4–8.3)

## 2014-11-29 LAB — CBC WITH DIFFERENTIAL/PLATELET
BASO%: 0.4 % (ref 0.0–2.0)
BASOS ABS: 0.1 10*3/uL (ref 0.0–0.1)
EOS%: 0.6 % (ref 0.0–7.0)
Eosinophils Absolute: 0.1 10*3/uL (ref 0.0–0.5)
HEMATOCRIT: 34.2 % — AB (ref 34.8–46.6)
HEMOGLOBIN: 11.1 g/dL — AB (ref 11.6–15.9)
LYMPH#: 2.8 10*3/uL (ref 0.9–3.3)
LYMPH%: 22 % (ref 14.0–49.7)
MCH: 29.3 pg (ref 25.1–34.0)
MCHC: 32.5 g/dL (ref 31.5–36.0)
MCV: 90 fL (ref 79.5–101.0)
MONO#: 1.6 10*3/uL — AB (ref 0.1–0.9)
MONO%: 12.8 % (ref 0.0–14.0)
NEUT#: 8.2 10*3/uL — ABNORMAL HIGH (ref 1.5–6.5)
NEUT%: 64.2 % (ref 38.4–76.8)
PLATELETS: 543 10*3/uL — AB (ref 145–400)
RBC: 3.8 10*6/uL (ref 3.70–5.45)
RDW: 15.6 % — AB (ref 11.2–14.5)
WBC: 12.8 10*3/uL — ABNORMAL HIGH (ref 3.9–10.3)

## 2014-11-29 LAB — RESEARCH LABS

## 2014-11-29 MED ORDER — HEPARIN SOD (PORK) LOCK FLUSH 100 UNIT/ML IV SOLN
500.0000 [IU] | Freq: Once | INTRAVENOUS | Status: AC | PRN
  Administered 2014-11-29: 500 [IU]
  Filled 2014-11-29: qty 5

## 2014-11-29 MED ORDER — SODIUM CHLORIDE 0.9 % IJ SOLN
10.0000 mL | INTRAMUSCULAR | Status: DC | PRN
  Administered 2014-11-29: 10 mL
  Filled 2014-11-29: qty 10

## 2014-11-29 MED ORDER — SODIUM CHLORIDE 0.9 % IV SOLN
Freq: Once | INTRAVENOUS | Status: AC
  Administered 2014-11-29: 11:00:00 via INTRAVENOUS

## 2014-11-29 MED ORDER — CYANOCOBALAMIN 1000 MCG/ML IJ SOLN
INTRAMUSCULAR | Status: AC
  Filled 2014-11-29: qty 1

## 2014-11-29 MED ORDER — CYANOCOBALAMIN 1000 MCG/ML IJ SOLN
1000.0000 ug | Freq: Once | INTRAMUSCULAR | Status: AC
  Administered 2014-11-29: 1000 ug via INTRAMUSCULAR

## 2014-11-29 MED ORDER — SODIUM CHLORIDE 0.9 % IV SOLN
Freq: Once | INTRAVENOUS | Status: AC
  Administered 2014-11-29: 12:00:00 via INTRAVENOUS
  Filled 2014-11-29: qty 4

## 2014-11-29 MED ORDER — SODIUM CHLORIDE 0.9 % IV SOLN
500.0000 mg/m2 | Freq: Once | INTRAVENOUS | Status: AC
  Administered 2014-11-29: 900 mg via INTRAVENOUS
  Filled 2014-11-29: qty 36

## 2014-11-29 NOTE — Patient Instructions (Signed)
[facility] Discharge Instructions for Patients Receiving Chemotherapy  Today you received the following chemotherapy agents: Alimta, b12 injection  To help prevent nausea and vomiting after your treatment, we encourage you to take your nausea medication as prescribed by your physician.   If you develop nausea and vomiting that is not controlled by your nausea medication, call the clinic.   BELOW ARE SYMPTOMS THAT SHOULD BE REPORTED IMMEDIATELY:  *FEVER GREATER THAN 100.5 F  *CHILLS WITH OR WITHOUT FEVER  NAUSEA AND VOMITING THAT IS NOT CONTROLLED WITH YOUR NAUSEA MEDICATION  *UNUSUAL SHORTNESS OF BREATH  *UNUSUAL BRUISING OR BLEEDING  TENDERNESS IN MOUTH AND THROAT WITH OR WITHOUT PRESENCE OF ULCERS  *URINARY PROBLEMS  *BOWEL PROBLEMS  UNUSUAL RASH Items with * indicate a potential emergency and should be followed up as soon as possible.  Feel free to call the clinic you have any questions or concerns. The clinic phone number is [phone].  Please show the [facility] at check-in to the Emergency Department and triage nurse.

## 2014-11-29 NOTE — Progress Notes (Signed)
[place] [phone]   [phone]  OFFICE PROGRESS NOTE  [name], MD [place] [address]  Diagnosis:  Metastatic non-small cell lung cancer initially diagnosed with locally advanced stage IIIB with right Pancoast tumor involving the vertebral body as well as the foraminal canal invasion with spinal cord compression in October of 2007.   Prior Therapy:  1. Status post concurrent chemoradiation with weekly carboplatin and paclitaxel, last dose was given November 18, 2005. 2. 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 3. Status post gamma knife radiotherapy to a solitary brain lesion located in the superior frontal area of the brain at [facility] in April of 2008. 4. Status post palliative radiotherapy to the lateral abdominal wall metastatic lesion under the care of Dr. Lisbeth Renshaw, completed March of 2009. 5. Status post 6 cycles of systemic chemotherapy with carboplatin and Alimta. Last dose was given July 26, 2007 with disease stabilization. 6. Gamma knife stereotactic radiotherapy to 2 brain lesions one involving the right frontal dural based as well as right parietal lesion performed on 05/07/2012 under the care of Dr. Vallarie Mare at [facility].  Current Therapy:  Maintenance chemotherapy with Alimta 500 mg per meter squared given every 3 weeks. The patient is status post 125 cycles.   CHEMOTHERAPY INTENT: Palliative/maintenance  CURRENT # OF CHEMOTHERAPY CYCLES: 126 CURRENT ANTIEMETICS: Zofran, dexamethasone and Compazine  CURRENT SMOKING STATUS: Quit smoking 10/07/2005  ORAL CHEMOTHERAPY AND CONSENT: None  CURRENT BISPHOSPHONATES USE: None  PAIN MANAGEMENT: 0/10 currently on morphine  NARCOTICS INDUCED CONSTIPATION: No constipation  LIVING WILL AND CODE STATUS: No CODE BLUE  INTERVAL HISTORY: Danielle [name] Mayor 64 y.o. female  returns to the clinic today for followup visit. The patient is feeling fine today with no specific complaints. She has mild cough. She is tolerating her maintenance treatment with single agent Alimta fairly well with no significant adverse effects. The patient denied having any chest pain, shortness of breath, or hemoptysis. No nausea or vomiting. The patient denied having any weight loss or night sweats. She is here today to start cycle #126 of her maintenance treatment with single agent Alimta.  MEDICAL HISTORY: Past Medical History  Diagnosis Date  . Hypertension   . Anemia   . H/O: pneumonia   . History of tobacco abuse quit 10/08    on nicotine patch  . Fibromyalgia   . Hypokalemia   . DJD (degenerative joint disease), cervical   . Thrush 12/11/2010  . Lung cancer ([place]) dx'd 09/2005    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 had chemo and radiation for lung ca  . CVA (cerebral vascular accident) ([place]) 04/2014    pt states had 2 cva's within 2 wks    ALLERGIES:  is allergic to contrast media; iohexol; sulfa drugs cross reactors; and co-trimoxazole injection.  MEDICATIONS:  Current Outpatient Prescriptions  Medication Sig Dispense Refill  . ALPRAZolam (XANAX) 0.25 MG tablet TAKE 1 TABLET BY MOUTH AT BEDTIME AS NEEDED FOR SLEEP 30 tablet 0  . amLODipine (NORVASC) 10 MG tablet TAKE 1 TABLET (10 MG TOTAL) BY MOUTH DAILY. 90 tablet 1  . aspirin EC 325 MG tablet TAKE 1 TABLET (325 MG TOTAL) BY MOUTH DAILY. 30 tablet 0  . atorvastatin (LIPITOR) 40 MG tablet Take 1 tablet (40 mg total) by mouth daily at 6 PM. 90 tablet 3  . fluticasone (FLONASE) 50 MCG/ACT  nasal spray USE 1-2 SPRAYS IN EACH NOSTRIL DAILY (Patient taking differently: USE 1-2 SPRAYS IN EACH NOSTRIL DAILY PRN CONGESTION) 16 g 11  . folic acid (FOLVITE) 1 MG tablet TAKE 1 TABLET BY MOUTH DAILY. 90 tablet 1  . furosemide (LASIX) 20 MG tablet TAKE 1 TABLET (20 MG TOTAL) BY MOUTH DAILY AS NEEDED FOR EDEMA. 10 tablet 0  .  lidocaine-prilocaine (EMLA) cream Apply to Port-A-Cath as directed 30 g 1  . lisinopril (PRINIVIL,ZESTRIL) 20 MG tablet TAKE 2 TABLETS (40 MG TOTAL) BY MOUTH DAILY. 60 tablet 5  . morphine (MS CONTIN) 60 MG 12 hr tablet Take 1 tablet (60 mg total) by mouth 2 (two) times daily. 60 tablet 0  . morphine (MSIR) 30 MG tablet Take 1 tablet (30 mg total) by mouth every 4 (four) hours as needed (breakthrough pain). 60 tablet 0  . Olopatadine HCl 0.2 % SOLN Apply 1 drop to eye daily. 2.5 mL 11  . ondansetron (ZOFRAN) 8 MG tablet TAKE 1 TABLET (8 MG TOTAL) BY MOUTH 2 (TWO) TIMES DAILY AS NEEDED FOR NAUSEA OR VOMITING. 30 tablet 2  . predniSONE (DELTASONE) 50 MG tablet Take 50 mg by mouth See admin instructions. Take 1 tablet (50 mg) 13 hours before CT scan, 7 hours before CT scan and 1 hour before CT scan (every 9 weeks)     No current facility-administered medications for this visit.    SURGICAL HISTORY:  Past Surgical History  Procedure Laterality Date  . Cervical laminectomy  1995  . Other surgical history  2008    Gamma knife surgery to remove brain met   . 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 Knee surgery  1990    Left x 2  . Portacath placement  -[name]    TIP IN CAVOATRIAL JUNCTION  . Loop recorder implant N/A 04/19/2014    Procedure: LOOP RECORDER IMPLANT;  Surgeon: Thompson Grayer, MD;  Location: [facility] CATH LAB;  Service: Cardiovascular;  Laterality: N/A;  . Tee without cardioversion N/A 04/19/2014    Procedure: TRANSESOPHAGEAL ECHOCARDIOGRAM (TEE);  Surgeon: Larey Dresser, MD;  Location: [facility];  Service: Cardiovascular;  Laterality: N/A;    REVIEW OF SYSTEMS:  A comprehensive review of systems was negative except for: Respiratory: positive for cough   PHYSICAL EXAMINATION: General appearance: alert, cooperative and no distress Head: Normocephalic, without obvious abnormality, atraumatic Neck: no adenopathy, no JVD, supple, symmetrical, trachea  midline and thyroid not enlarged, symmetric, no tenderness/mass/nodules Lymph nodes: Cervical, supraclavicular, and axillary nodes normal. Resp: clear to auscultation bilaterally Back: symmetric, no curvature. ROM normal. No CVA tenderness. Cardio: regular rate and rhythm, S1, S2 normal, no murmur, click, rub or gallop GI: soft, non-tender; bowel sounds normal; no masses,  no organomegaly Extremities: extremities normal, atraumatic, no cyanosis or edema Neurologic: Alert and oriented X 3, normal strength and tone. Normal symmetric reflexes. Normal coordination and gait  ECOG PERFORMANCE STATUS: 0 - Asymptomatic  Blood pressure 121/53, pulse 85, temperature 98.4 F (36.9 C), temperature source Oral, resp. rate 18, height '5\' 3"'  (1.6 m), weight 148 lb (67.132 kg), SpO2 100 %.  LABORATORY DATA: Lab Results  Component Value Date   WBC 12.8* 11/29/2014   HGB 11.1* 11/29/2014   HCT 34.2* 11/29/2014   MCV 90.0 11/29/2014   PLT 543* 11/29/2014      Chemistry      Component Value Date/Time   NA 139 11/29/2014 0950  NA 135 04/26/2014 1205   K 4.5 11/29/2014 0950   K 4.2 04/26/2014 1205   CL 103 04/26/2014 1205   CL 106 06/30/2012 1004   CO2 28 11/29/2014 0950   CO2 29 04/26/2014 1205   BUN 17.4 11/29/2014 0950   BUN 17 04/26/2014 1205   CREATININE 0.9 11/29/2014 0950   CREATININE 0.67 04/26/2014 1205      Component Value Date/Time   CALCIUM 9.9 11/29/2014 0950   CALCIUM 9.5 04/26/2014 1205   ALKPHOS 90 11/29/2014 0950   ALKPHOS 57 04/26/2014 1205   AST 17 11/29/2014 0950   AST 15 04/26/2014 1205   ALT 12 11/29/2014 0950   ALT 10 04/26/2014 1205   BILITOT <0.30 11/29/2014 0950   BILITOT 0.4 04/26/2014 1205       RADIOGRAPHIC STUDIES:  ASSESSMENT AND PLAN: This is a very pleasant 64 years old white female with metastatic non-small cell lung cancer currently undergoing maintenance chemotherapy with single agent Alimta status post 125 cycles. The patient is tolerating her  treatment fairly well with no significant adverse effects. She has mild cough secondary to cold symptoms over the last few days. She has no fever or chills. I recommended for the patient to proceed with her systemic therapy today as scheduled. She will receive cycle #126 today. The patient would come back for followup visit in 3 weeks for evaluation before starting cycle #127. For pain management, she will continue on her current pain medication with MS Contin and MS IR.  She was advised to call immediately if she has any concerning symptoms in the interval. The patient voices understanding of current disease status and treatment options and is in agreement with the current care plan.  All questions were answered. The patient knows to call the clinic with any problems, questions or concerns. We can certainly see the patient much sooner if necessary.  Disclaimer: This note was dictated with voice recognition software. Similar sounding words can inadvertently be transcribed and may not be corrected upon review.

## 2014-11-29 NOTE — Progress Notes (Signed)
11/29/14 - BMS [id number] Questionnaires (PRO's) - Patient into cancer center for routine visit.  The patient was given the PRO's upon arrival to the cancer center.  The patient completed the PRO's ([id number] first and the LCSS booklet. I checked the PRO's for completeness.  The patient had her research labs drawn today.  The patient was thanked for her continued support in this clinical trial Remer Macho 11/29/14 - 10:00 am.

## 2014-12-02 LAB — CUP PACEART REMOTE DEVICE CHECK: Date Time Interrogation Session: 20161108193609

## 2014-12-05 ENCOUNTER — Telehealth: Payer: Self-pay | Admitting: *Deleted

## 2014-12-05 MED ORDER — ONDANSETRON HCL 8 MG PO TABS: ORAL_TABLET | ORAL | Status: DC

## 2014-12-05 MED ORDER — ALPRAZOLAM 0.25 MG PO TABS: 0.2500 mg | ORAL_TABLET | Freq: Every evening | ORAL | Status: DC | PRN

## 2014-12-05 NOTE — Telephone Encounter (Signed)
Received vm call from pt asking for refill o her xanax & zofran.  These will be sent to her pharmacy.

## 2014-12-08 ENCOUNTER — Telehealth: Payer: Self-pay | Admitting: Internal Medicine

## 2014-12-08 NOTE — Telephone Encounter (Signed)
January appointments added by [place] 11/22. Lab/tx appointments adjusted. Patient to get new schedule at 12/13 visit.

## 2014-12-11 ENCOUNTER — Other Ambulatory Visit: Payer: Self-pay | Admitting: Internal Medicine

## 2014-12-12 ENCOUNTER — Other Ambulatory Visit: Payer: Self-pay | Admitting: Medical Oncology

## 2014-12-12 DIAGNOSIS — C3490 Malignant neoplasm of unspecified part of unspecified bronchus or lung: Secondary | ICD-10-CM

## 2014-12-12 DIAGNOSIS — C3410 Malignant neoplasm of upper lobe, unspecified bronchus or lung: Secondary | ICD-10-CM

## 2014-12-12 MED ORDER — MORPHINE SULFATE ER 60 MG PO TBCR: 60.0000 mg | EXTENDED_RELEASE_TABLET | Freq: Two times a day (BID) | ORAL | Status: DC

## 2014-12-12 MED ORDER — MORPHINE SULFATE 30 MG PO TABS: 30.0000 mg | ORAL_TABLET | ORAL | Status: DC | PRN

## 2014-12-12 NOTE — Progress Notes (Signed)
Refills ready for pick up.

## 2014-12-15 ENCOUNTER — Ambulatory Visit (INDEPENDENT_AMBULATORY_CARE_PROVIDER_SITE_OTHER): Payer: Medicare Other | Admitting: *Deleted

## 2014-12-15 DIAGNOSIS — I639 Cerebral infarction, unspecified: Secondary | ICD-10-CM | POA: Diagnosis not present

## 2014-12-16 NOTE — Progress Notes (Signed)
[facility] Summary Report / Loop Recorder 

## 2014-12-20 ENCOUNTER — Encounter: Payer: Self-pay | Admitting: Internal Medicine

## 2014-12-20 ENCOUNTER — Other Ambulatory Visit (HOSPITAL_BASED_OUTPATIENT_CLINIC_OR_DEPARTMENT_OTHER): Payer: Medicare Other

## 2014-12-20 ENCOUNTER — Ambulatory Visit (HOSPITAL_BASED_OUTPATIENT_CLINIC_OR_DEPARTMENT_OTHER): Payer: Medicare Other | Admitting: Internal Medicine

## 2014-12-20 ENCOUNTER — Ambulatory Visit (HOSPITAL_BASED_OUTPATIENT_CLINIC_OR_DEPARTMENT_OTHER): Payer: Medicare Other

## 2014-12-20 ENCOUNTER — Telehealth: Payer: Self-pay | Admitting: Internal Medicine

## 2014-12-20 VITALS — BP 125/57 | HR 97 | Temp 98.2°F | Resp 18 | Ht 63.0 in | Wt 147.1 lb

## 2014-12-20 DIAGNOSIS — C3411 Malignant neoplasm of upper lobe, right bronchus or lung: Secondary | ICD-10-CM

## 2014-12-20 DIAGNOSIS — C3490 Malignant neoplasm of unspecified part of unspecified bronchus or lung: Secondary | ICD-10-CM

## 2014-12-20 DIAGNOSIS — Z5111 Encounter for antineoplastic chemotherapy: Secondary | ICD-10-CM | POA: Diagnosis present

## 2014-12-20 DIAGNOSIS — C7931 Secondary malignant neoplasm of brain: Secondary | ICD-10-CM

## 2014-12-20 LAB — COMPREHENSIVE METABOLIC PANEL
ALBUMIN: 3.5 g/dL (ref 3.5–5.0)
ALK PHOS: 92 U/L (ref 40–150)
ALT: 12 U/L (ref 0–55)
ANION GAP: 9 meq/L (ref 3–11)
AST: 18 U/L (ref 5–34)
BILIRUBIN TOTAL: 0.47 mg/dL (ref 0.20–1.20)
BUN: 12.3 mg/dL (ref 7.0–26.0)
CO2: 25 mEq/L (ref 22–29)
CREATININE: 1 mg/dL (ref 0.6–1.1)
Calcium: 10 mg/dL (ref 8.4–10.4)
Chloride: 107 mEq/L (ref 98–109)
EGFR: 64 mL/min/{1.73_m2} — AB (ref >90)
Glucose: 144 mg/dl — ABNORMAL HIGH (ref 70–140)
Potassium: 4.5 mEq/L (ref 3.5–5.1)
Sodium: 141 mEq/L (ref 136–145)
TOTAL PROTEIN: 7.3 g/dL (ref 6.4–8.3)

## 2014-12-20 LAB — CBC WITH DIFFERENTIAL/PLATELET
BASO%: 0.2 % (ref 0.0–2.0)
Basophils Absolute: 0 10*3/uL (ref 0.0–0.1)
EOS ABS: 0.1 10*3/uL (ref 0.0–0.5)
EOS%: 1.4 % (ref 0.0–7.0)
HCT: 37 % (ref 34.8–46.6)
HGB: 11.9 g/dL (ref 11.6–15.9)
LYMPH%: 24.9 % (ref 14.0–49.7)
MCH: 29.3 pg (ref 25.1–34.0)
MCHC: 32.2 g/dL (ref 31.5–36.0)
MCV: 91.1 fL (ref 79.5–101.0)
MONO#: 1 10*3/uL — ABNORMAL HIGH (ref 0.1–0.9)
MONO%: 10.9 % (ref 0.0–14.0)
NEUT%: 62.6 % (ref 38.4–76.8)
NEUTROS ABS: 5.9 10*3/uL (ref 1.5–6.5)
PLATELETS: 362 10*3/uL (ref 145–400)
RBC: 4.06 10*6/uL (ref 3.70–5.45)
RDW: 15.1 % — AB (ref 11.2–14.5)
WBC: 9.4 10*3/uL (ref 3.9–10.3)
lymph#: 2.3 10*3/uL (ref 0.9–3.3)

## 2014-12-20 MED ORDER — SODIUM CHLORIDE 0.9 % IV SOLN
Freq: Once | INTRAVENOUS | Status: AC
  Administered 2014-12-20: 10:00:00 via INTRAVENOUS
  Filled 2014-12-20: qty 4

## 2014-12-20 MED ORDER — SODIUM CHLORIDE 0.9 % IJ SOLN
10.0000 mL | INTRAMUSCULAR | Status: DC | PRN
  Administered 2014-12-20: 10 mL
  Filled 2014-12-20: qty 10

## 2014-12-20 MED ORDER — FUROSEMIDE 20 MG PO TABS: ORAL_TABLET | ORAL | Status: DC

## 2014-12-20 MED ORDER — SODIUM CHLORIDE 0.9 % IV SOLN
Freq: Once | INTRAVENOUS | Status: AC
  Administered 2014-12-20: 10:00:00 via INTRAVENOUS

## 2014-12-20 MED ORDER — SODIUM CHLORIDE 0.9 % IV SOLN
500.0000 mg/m2 | Freq: Once | INTRAVENOUS | Status: AC
  Administered 2014-12-20: 900 mg via INTRAVENOUS
  Filled 2014-12-20: qty 36

## 2014-12-20 MED ORDER — LIDOCAINE-PRILOCAINE 2.5-2.5 % EX CREA: TOPICAL_CREAM | CUTANEOUS | Status: DC

## 2014-12-20 MED ORDER — HEPARIN SOD (PORK) LOCK FLUSH 100 UNIT/ML IV SOLN
500.0000 [IU] | Freq: Once | INTRAVENOUS | Status: AC | PRN
  Administered 2014-12-20: 500 [IU]
  Filled 2014-12-20: qty 5

## 2014-12-20 NOTE — Patient Instructions (Signed)
Pemetrexed injection What is this medicine? PEMETREXED (PEM e TREX ed) is a chemotherapy drug. This medicine affects cells that are rapidly growing, such as cancer cells and cells in your mouth and stomach. It is usually used to treat lung cancers like non-small cell lung cancer and mesothelioma. It may also be used to treat other cancers. This medicine may be used for other purposes; ask your health care provider or pharmacist if you have questions. What should I tell my health care provider before I take this medicine? They need to know if you have any of these conditions: -if you frequently drink alcohol containing beverages -infection (especially a virus infection such as chickenpox, cold sores, or herpes) -kidney disease -liver disease -low blood counts, like low platelets, red bloods, or white blood cells -an unusual or allergic reaction to pemetrexed, mannitol, other medicines, foods, dyes, or preservatives -pregnant or trying to get pregnant -breast-feeding How should I use this medicine? This drug is given as an infusion into a vein. It is administered in a hospital or clinic by a specially trained health care professional. Talk to your pediatrician regarding the use of this medicine in children. Special care may be needed. Overdosage: If you think you have taken too much of this medicine contact a poison control center or emergency room at once. NOTE: This medicine is only for you. Do not share this medicine with others. What if I miss a dose? It is important not to miss your dose. Call your doctor or health care professional if you are unable to keep an appointment. What may interact with this medicine? -aspirin and aspirin-like medicines -medicines to increase blood counts like filgrastim, pegfilgrastim, sargramostim -methotrexate -NSAIDS, medicines for pain and inflammation, like ibuprofen or naproxen -probenecid -pyrimethamine -vaccines Talk to your doctor or health care  professional before taking any of these medicines: -acetaminophen -aspirin -ibuprofen -ketoprofen -naproxen This list may not describe all possible interactions. Give your health care provider a list of all the medicines, herbs, non-prescription drugs, or dietary supplements you use. Also tell them if you smoke, drink alcohol, or use illegal drugs. Some items may interact with your medicine. What should I watch for while using this medicine? Visit your doctor for checks on your progress. This drug may make you feel generally unwell. This is not uncommon, as chemotherapy can affect healthy cells as well as cancer cells. Report any side effects. Continue your course of treatment even though you feel ill unless your doctor tells you to stop. In some cases, you may be given additional medicines to help with side effects. Follow all directions for their use. Call your doctor or health care professional for advice if you get a fever, chills or sore throat, or other symptoms of a cold or flu. Do not treat yourself. This drug decreases your body's ability to fight infections. Try to avoid being around people who are sick. This medicine may increase your risk to bruise or bleed. Call your doctor or health care professional if you notice any unusual bleeding. Be careful brushing and flossing your teeth or using a toothpick because you may get an infection or bleed more easily. If you have any dental work done, tell your dentist you are receiving this medicine. Avoid taking products that contain aspirin, acetaminophen, ibuprofen, naproxen, or ketoprofen unless instructed by your doctor. These medicines may hide a fever. Call your doctor or health care professional if you get diarrhea or mouth sores. Do not treat yourself. To protect your  kidneys, drink water or other fluids as directed while you are taking this medicine. Men and women must use effective birth control while taking this medicine. You may also  need to continue using effective birth control for a time after stopping this medicine. Do not become pregnant while taking this medicine. Tell your doctor right away if you think that you or your partner might be pregnant. There is a potential for serious side effects to an unborn child. Talk to your health care professional or pharmacist for more information. Do not breast-feed an infant while taking this medicine. This medicine may lower sperm counts. What side effects may I notice from receiving this medicine? Side effects that you should report to your doctor or health care professional as soon as possible: -allergic reactions like skin rash, itching or hives, swelling of the face, lips, or tongue -low blood counts - this medicine may decrease the number of white blood cells, red blood cells and platelets. You may be at increased risk for infections and bleeding. -signs of infection - fever or chills, cough, sore throat, pain or difficulty passing urine -signs of decreased platelets or bleeding - bruising, pinpoint red spots on the skin, black, tarry stools, blood in the urine -signs of decreased red blood cells - unusually weak or tired, fainting spells, lightheadedness -breathing problems, like a dry cough -changes in emotions or moods -chest pain -confusion -diarrhea -high blood pressure -mouth or throat sores or ulcers -pain, swelling, warmth in the leg -pain on swallowing -swelling of the ankles, feet, hands -trouble passing urine or change in the amount of urine -vomiting -yellowing of the eyes or skin Side effects that usually do not require medical attention (report to your doctor or health care professional if they continue or are bothersome): -hair loss -loss of appetite -nausea -stomach upset This list may not describe all possible side effects. Call your doctor for medical advice about side effects. You may report side effects to FDA at 1-800-FDA-1088. Where should I keep  my medicine? This drug is given in a hospital or clinic and will not be stored at home. NOTE: This sheet is a summary. It may not cover all possible information. If you have questions about this medicine, talk to your doctor, pharmacist, or health care provider.    2016, [employer]. (2007-07-28 13:24:03)

## 2014-12-20 NOTE — Telephone Encounter (Signed)
gave pt copy of avs-gave pt contrast-Adv central sch to call and sch scan

## 2014-12-20 NOTE — Progress Notes (Signed)
[place] [phone]   [phone]) [phone]  OFFICE PROGRESS NOTE  [name], MD [place] [address]  Diagnosis:  Metastatic non-small cell lung cancer initially diagnosed with locally advanced stage IIIB with right Pancoast tumor involving the vertebral body as well as the foraminal canal invasion with spinal cord compression in October of 2007.   Prior Therapy:  1. Status post concurrent chemoradiation with weekly carboplatin and paclitaxel, last dose was given November 18, 2005. 2. 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 3. Status post gamma knife radiotherapy to a solitary brain lesion located in the superior frontal area of the brain at [facility] in April of 2008. 4. Status post palliative radiotherapy to the lateral abdominal wall metastatic lesion under the care of Dr. Lisbeth Renshaw, completed March of 2009. 5. Status post 6 cycles of systemic chemotherapy with carboplatin and Alimta. Last dose was given July 26, 2007 with disease stabilization. 6. Gamma knife stereotactic radiotherapy to 2 brain lesions one involving the right frontal dural based as well as right parietal lesion performed on 05/07/2012 under the care of Dr. Vallarie Mare at [facility].  Current Therapy:  Maintenance chemotherapy with Alimta 500 mg per meter squared given every 3 weeks. The patient is status post 126 cycles.   CHEMOTHERAPY INTENT: Palliative/maintenance  CURRENT # OF CHEMOTHERAPY CYCLES: 127 CURRENT ANTIEMETICS: Zofran, dexamethasone and Compazine  CURRENT SMOKING STATUS: Quit smoking 10/07/2005  ORAL CHEMOTHERAPY AND CONSENT: None  CURRENT BISPHOSPHONATES USE: None  PAIN MANAGEMENT: 0/10 currently on morphine  NARCOTICS INDUCED CONSTIPATION: No constipation  LIVING WILL AND CODE STATUS: No CODE BLUE  INTERVAL HISTORY: Danielle [name] 64 y.o. female  returns to the clinic today for followup visit accompanied by her sister. The patient is feeling fine today with no specific complaints except for mild low back pain. She is tolerating her maintenance treatment with single agent Alimta fairly well with no significant adverse effects. The patient denied having any chest pain, shortness of breath, or hemoptysis. No nausea or vomiting. The patient denied having any weight loss or night sweats. She is here today to start cycle #127 of her maintenance treatment with single agent Alimta.  MEDICAL HISTORY: Past Medical History  Diagnosis Date  . Hypertension   . Anemia   . H/O: pneumonia   . History of tobacco abuse quit 10/08    on nicotine patch  . Fibromyalgia   . Hypokalemia   . DJD (degenerative joint disease), cervical   . Thrush 12/11/2010  . Lung cancer ([place]) dx'd 09/2005    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 had chemo and radiation for lung ca  . CVA (cerebral vascular accident) ([place]) 04/2014    pt states had 2 cva's within 2 wks    ALLERGIES:  is allergic to contrast media; iohexol; sulfa drugs cross reactors; and co-trimoxazole injection.  MEDICATIONS:  Current Outpatient Prescriptions  Medication Sig Dispense Refill  . ALPRAZolam (XANAX) 0.25 MG tablet Take 1 tablet (0.25 mg total) by mouth at bedtime as needed. for sleep 30 tablet 0  . amLODipine (NORVASC) 10 MG tablet TAKE 1 TABLET (10 MG TOTAL) BY MOUTH DAILY. 90 tablet 1  . aspirin EC 325 MG tablet TAKE 1 TABLET (325 MG TOTAL) BY MOUTH DAILY. 30 tablet 0  . atorvastatin (LIPITOR) 40 MG tablet Take 1 tablet (40 mg total) by mouth daily at 6 PM.  90 tablet 3  . fluticasone (FLONASE) 50 MCG/ACT nasal spray USE 1-2 SPRAYS IN EACH NOSTRIL DAILY (Patient taking differently: USE 1-2 SPRAYS IN EACH NOSTRIL DAILY PRN CONGESTION) 16 g 11  . folic acid (FOLVITE) 1 MG tablet TAKE 1 TABLET BY MOUTH DAILY. 90 tablet 1  . furosemide (LASIX) 20 MG tablet TAKE 1 TABLET (20 MG TOTAL) BY  MOUTH DAILY AS NEEDED FOR EDEMA. 10 tablet 0  . lidocaine-prilocaine (EMLA) cream Apply to Port-A-Cath as directed 30 g 1  . lisinopril (PRINIVIL,ZESTRIL) 20 MG tablet TAKE 2 TABLETS (40 MG TOTAL) BY MOUTH DAILY. 60 tablet 5  . morphine (MS CONTIN) 60 MG 12 hr tablet Take 1 tablet (60 mg total) by mouth 2 (two) times daily. 60 tablet 0  . morphine (MSIR) 30 MG tablet Take 1 tablet (30 mg total) by mouth every 4 (four) hours as needed (breakthrough pain). 60 tablet 0  . Olopatadine HCl 0.2 % SOLN Apply 1 drop to eye daily. 2.5 mL 11  . ondansetron (ZOFRAN) 8 MG tablet TAKE 1 TABLET (8 MG TOTAL) BY MOUTH 2 (TWO) TIMES DAILY AS NEEDED FOR NAUSEA OR VOMITING. 30 tablet 2  . predniSONE (DELTASONE) 50 MG tablet Take 50 mg by mouth See admin instructions. Take 1 tablet (50 mg) 13 hours before CT scan, 7 hours before CT scan and 1 hour before CT scan (every 9 weeks)     No current facility-administered medications for this visit.    SURGICAL HISTORY:  Past Surgical History  Procedure Laterality Date  . Cervical laminectomy  1995  . Other surgical history  2008    Gamma knife surgery to remove brain met   . 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 Knee surgery  1990    Left x 2  . Portacath placement  -[name]    TIP IN CAVOATRIAL JUNCTION  . Loop recorder implant N/A 04/19/2014    Procedure: LOOP RECORDER IMPLANT;  Surgeon: Thompson Grayer, MD;  Location: [facility] CATH LAB;  Service: Cardiovascular;  Laterality: N/A;  . Tee without cardioversion N/A 04/19/2014    Procedure: TRANSESOPHAGEAL ECHOCARDIOGRAM (TEE);  Surgeon: Larey Dresser, MD;  Location: [facility];  Service: Cardiovascular;  Laterality: N/A;    REVIEW OF SYSTEMS:  A comprehensive review of systems was negative except for: Musculoskeletal: positive for back pain   PHYSICAL EXAMINATION: General appearance: alert, cooperative and no distress Head: Normocephalic, without obvious abnormality, atraumatic Neck:  no adenopathy, no JVD, supple, symmetrical, trachea midline and thyroid not enlarged, symmetric, no tenderness/mass/nodules Lymph nodes: Cervical, supraclavicular, and axillary nodes normal. Resp: clear to auscultation bilaterally Back: symmetric, no curvature. ROM normal. No CVA tenderness. Cardio: regular rate and rhythm, S1, S2 normal, no murmur, click, rub or gallop GI: soft, non-tender; bowel sounds normal; no masses,  no organomegaly Extremities: extremities normal, atraumatic, no cyanosis or edema Neurologic: Alert and oriented X 3, normal strength and tone. Normal symmetric reflexes. Normal coordination and gait  ECOG PERFORMANCE STATUS: 0 - Asymptomatic  Blood pressure 125/57, pulse 97, temperature 98.2 F (36.8 C), temperature source Oral, resp. rate 18, height '5\' 3"'  (1.6 m), weight 147 lb 1.6 oz (66.724 kg), SpO2 100 %.  LABORATORY DATA: Lab Results  Component Value Date   WBC 9.4 12/20/2014   HGB 11.9 12/20/2014   HCT 37.0 12/20/2014   MCV 91.1 12/20/2014   PLT 362 12/20/2014      Chemistry  Component Value Date/Time   NA 141 12/20/2014 0846   NA 135 04/26/2014 1205   K 4.5 12/20/2014 0846   K 4.2 04/26/2014 1205   CL 103 04/26/2014 1205   CL 106 06/30/2012 1004   CO2 25 12/20/2014 0846   CO2 29 04/26/2014 1205   BUN 12.3 12/20/2014 0846   BUN 17 04/26/2014 1205   CREATININE 1.0 12/20/2014 0846   CREATININE 0.67 04/26/2014 1205      Component Value Date/Time   CALCIUM 10.0 12/20/2014 0846   CALCIUM 9.5 04/26/2014 1205   ALKPHOS 92 12/20/2014 0846   ALKPHOS 57 04/26/2014 1205   AST 18 12/20/2014 0846   AST 15 04/26/2014 1205   ALT 12 12/20/2014 0846   ALT 10 04/26/2014 1205   BILITOT 0.47 12/20/2014 0846   BILITOT 0.4 04/26/2014 1205       RADIOGRAPHIC STUDIES:  ASSESSMENT AND PLAN: This is a very pleasant 64 years old white female with metastatic non-small cell lung cancer currently undergoing maintenance chemotherapy with single agent Alimta  status post 126 cycles. The patient is tolerating her treatment fairly well with no significant adverse effects.  I recommended for the patient to proceed with her systemic therapy today as scheduled. She will receive cycle #127 today. The patient would come back for followup visit in 3 weeks for evaluation before starting cycle #128 after repeating CT scan of the chest, abdomen and pelvis for restaging of her disease. For pain management, she will continue on her current pain medication with MS Contin and MS IR.  The patient requested refill of Lasix and Emla cream today and this will be sent to her pharmacy. She was advised to call immediately if she has any concerning symptoms in the interval. The patient voices understanding of current disease status and treatment options and is in agreement with the current care plan.  All questions were answered. The patient knows to call the clinic with any problems, questions or concerns. We can certainly see the patient much sooner if necessary.  Disclaimer: This note was dictated with voice recognition software. Similar sounding words can inadvertently be transcribed and may not be corrected upon review.

## 2014-12-21 ENCOUNTER — Other Ambulatory Visit: Payer: Self-pay | Admitting: *Deleted

## 2014-12-21 MED ORDER — LISINOPRIL 20 MG PO TABS: ORAL_TABLET | ORAL | Status: DC

## 2014-12-22 ENCOUNTER — Other Ambulatory Visit: Payer: Self-pay | Admitting: Family Medicine

## 2014-12-23 ENCOUNTER — Other Ambulatory Visit: Payer: Self-pay | Admitting: *Deleted

## 2014-12-23 DIAGNOSIS — C3411 Malignant neoplasm of upper lobe, right bronchus or lung: Secondary | ICD-10-CM

## 2014-12-29 ENCOUNTER — Other Ambulatory Visit: Payer: Self-pay | Admitting: Medical Oncology

## 2014-12-29 ENCOUNTER — Telehealth: Payer: Self-pay | Admitting: *Deleted

## 2014-12-29 DIAGNOSIS — C3411 Malignant neoplasm of upper lobe, right bronchus or lung: Secondary | ICD-10-CM

## 2014-12-29 MED ORDER — PREDNISONE 50 MG PO TABS: 50.0000 mg | ORAL_TABLET | ORAL | Status: DC

## 2014-12-29 NOTE — Progress Notes (Signed)
Called in refill for prednisone for premed for Ct scan.

## 2014-12-29 NOTE — Telephone Encounter (Signed)
VM message from patient requesting refill of decadron prior to CT scan scheduled for 01/06/15. Uses [facility] in [place].

## 2015-01-04 ENCOUNTER — Other Ambulatory Visit: Payer: Self-pay | Admitting: *Deleted

## 2015-01-04 DIAGNOSIS — C3400 Malignant neoplasm of unspecified main bronchus: Secondary | ICD-10-CM

## 2015-01-04 MED ORDER — ALPRAZOLAM 0.25 MG PO TABS: 0.2500 mg | ORAL_TABLET | Freq: Every evening | ORAL | Status: DC | PRN

## 2015-01-04 NOTE — Telephone Encounter (Signed)
Patient called reporting taking last pill last night and needing refill.

## 2015-01-06 ENCOUNTER — Encounter (HOSPITAL_COMMUNITY): Payer: Self-pay

## 2015-01-06 ENCOUNTER — Ambulatory Visit (HOSPITAL_COMMUNITY)
Admission: RE | Admit: 2015-01-06 | Discharge: 2015-01-06 | Disposition: A | Discharge status: Home / Self Care | Payer: Medicare Other | Source: Ambulatory Visit | Attending: Internal Medicine | Admitting: Internal Medicine

## 2015-01-06 DIAGNOSIS — C7931 Secondary malignant neoplasm of brain: Secondary | ICD-10-CM | POA: Insufficient documentation

## 2015-01-06 DIAGNOSIS — E041 Nontoxic single thyroid nodule: Secondary | ICD-10-CM | POA: Insufficient documentation

## 2015-01-06 DIAGNOSIS — I2510 Atherosclerotic heart disease of native coronary artery without angina pectoris: Secondary | ICD-10-CM | POA: Diagnosis not present

## 2015-01-06 DIAGNOSIS — I700 Atherosclerosis of aorta: Secondary | ICD-10-CM | POA: Diagnosis not present

## 2015-01-06 DIAGNOSIS — C3411 Malignant neoplasm of upper lobe, right bronchus or lung: Secondary | ICD-10-CM | POA: Insufficient documentation

## 2015-01-06 DIAGNOSIS — J432 Centrilobular emphysema: Secondary | ICD-10-CM | POA: Diagnosis not present

## 2015-01-06 MED ORDER — IOHEXOL 300 MG/ML  SOLN
150.0000 mL | Freq: Once | INTRAMUSCULAR | Status: AC | PRN
  Administered 2015-01-06: 100 mL via INTRAVENOUS

## 2015-01-10 ENCOUNTER — Ambulatory Visit (HOSPITAL_BASED_OUTPATIENT_CLINIC_OR_DEPARTMENT_OTHER): Payer: Medicare Other | Admitting: Internal Medicine

## 2015-01-10 ENCOUNTER — Other Ambulatory Visit (HOSPITAL_BASED_OUTPATIENT_CLINIC_OR_DEPARTMENT_OTHER): Payer: Medicare Other

## 2015-01-10 ENCOUNTER — Encounter: Payer: Self-pay | Admitting: Oncology

## 2015-01-10 ENCOUNTER — Encounter: Payer: Self-pay | Admitting: Internal Medicine

## 2015-01-10 ENCOUNTER — Ambulatory Visit (HOSPITAL_BASED_OUTPATIENT_CLINIC_OR_DEPARTMENT_OTHER): Payer: Medicare Other

## 2015-01-10 VITALS — BP 128/55 | HR 76 | Temp 98.3°F | Resp 18 | Ht 63.0 in | Wt 148.4 lb

## 2015-01-10 DIAGNOSIS — C3411 Malignant neoplasm of upper lobe, right bronchus or lung: Secondary | ICD-10-CM

## 2015-01-10 DIAGNOSIS — Z5111 Encounter for antineoplastic chemotherapy: Secondary | ICD-10-CM

## 2015-01-10 DIAGNOSIS — C7931 Secondary malignant neoplasm of brain: Secondary | ICD-10-CM

## 2015-01-10 DIAGNOSIS — C3490 Malignant neoplasm of unspecified part of unspecified bronchus or lung: Secondary | ICD-10-CM

## 2015-01-10 DIAGNOSIS — C3410 Malignant neoplasm of upper lobe, unspecified bronchus or lung: Secondary | ICD-10-CM

## 2015-01-10 LAB — COMPREHENSIVE METABOLIC PANEL
ALBUMIN: 3.3 g/dL — AB (ref 3.5–5.0)
ALT: 12 U/L (ref 0–55)
AST: 18 U/L (ref 5–34)
Alkaline Phosphatase: 80 U/L (ref 40–150)
Anion Gap: 7 mEq/L (ref 3–11)
BUN: 16.5 mg/dL (ref 7.0–26.0)
CO2: 28 meq/L (ref 22–29)
Calcium: 9.4 mg/dL (ref 8.4–10.4)
Chloride: 106 mEq/L (ref 98–109)
Creatinine: 0.8 mg/dL (ref 0.6–1.1)
EGFR: 74 mL/min/{1.73_m2} — AB (ref >90)
GLUCOSE: 140 mg/dL (ref 70–140)
POTASSIUM: 4.3 meq/L (ref 3.5–5.1)
SODIUM: 141 meq/L (ref 136–145)
TOTAL PROTEIN: 6.6 g/dL (ref 6.4–8.3)

## 2015-01-10 LAB — CBC WITH DIFFERENTIAL/PLATELET
BASO%: 0.7 % (ref 0.0–2.0)
Basophils Absolute: 0.1 10*3/uL (ref 0.0–0.1)
EOS ABS: 0.1 10*3/uL (ref 0.0–0.5)
EOS%: 1.2 % (ref 0.0–7.0)
HCT: 33.5 % — ABNORMAL LOW (ref 34.8–46.6)
HEMOGLOBIN: 10.9 g/dL — AB (ref 11.6–15.9)
LYMPH%: 26.4 % (ref 14.0–49.7)
MCH: 29.2 pg (ref 25.1–34.0)
MCHC: 32.6 g/dL (ref 31.5–36.0)
MCV: 89.4 fL (ref 79.5–101.0)
MONO#: 1 10*3/uL — ABNORMAL HIGH (ref 0.1–0.9)
MONO%: 10.4 % (ref 0.0–14.0)
NEUT%: 61.3 % (ref 38.4–76.8)
NEUTROS ABS: 5.9 10*3/uL (ref 1.5–6.5)
Platelets: 481 10*3/uL — ABNORMAL HIGH (ref 145–400)
RBC: 3.74 10*6/uL (ref 3.70–5.45)
RDW: 15.8 % — AB (ref 11.2–14.5)
WBC: 9.6 10*3/uL (ref 3.9–10.3)
lymph#: 2.5 10*3/uL (ref 0.9–3.3)

## 2015-01-10 LAB — RESEARCH LABS

## 2015-01-10 MED ORDER — MORPHINE SULFATE 30 MG PO TABS: 30.0000 mg | ORAL_TABLET | ORAL | Status: DC | PRN

## 2015-01-10 MED ORDER — HEPARIN SOD (PORK) LOCK FLUSH 100 UNIT/ML IV SOLN
500.0000 [IU] | Freq: Once | INTRAVENOUS | Status: AC | PRN
  Administered 2015-01-10: 500 [IU]
  Filled 2015-01-10: qty 5

## 2015-01-10 MED ORDER — SODIUM CHLORIDE 0.9 % IV SOLN
Freq: Once | INTRAVENOUS | Status: AC
  Administered 2015-01-10: 13:00:00 via INTRAVENOUS

## 2015-01-10 MED ORDER — SODIUM CHLORIDE 0.9 % IV SOLN
500.0000 mg/m2 | Freq: Once | INTRAVENOUS | Status: AC
  Administered 2015-01-10: 900 mg via INTRAVENOUS
  Filled 2015-01-10: qty 32

## 2015-01-10 MED ORDER — SODIUM CHLORIDE 0.9 % IJ SOLN
10.0000 mL | INTRAMUSCULAR | Status: DC | PRN
  Administered 2015-01-10: 10 mL
  Filled 2015-01-10: qty 10

## 2015-01-10 MED ORDER — SODIUM CHLORIDE 0.9 % IV SOLN
Freq: Once | INTRAVENOUS | Status: AC
  Administered 2015-01-10: 13:00:00 via INTRAVENOUS
  Filled 2015-01-10: qty 4

## 2015-01-10 MED ORDER — MORPHINE SULFATE ER 60 MG PO TBCR: 60.0000 mg | EXTENDED_RELEASE_TABLET | Freq: Two times a day (BID) | ORAL | Status: DC

## 2015-01-10 NOTE — Progress Notes (Signed)
01/10/15 - BMS [id number] - Questionnaires (PRO's) - Patient into cancer center for routine visit.  Patient given PRO's upon arrival to the cancer center.  Patient completed the PRO's (EQ-5D-3L first and then LCSS booklet).  The PRO's was checked for completeness.  The patient had her research labs drawn.  The patient was thanked for her continued support in this clinical trial.  Remer Macho 01/10/15 - 11:10 am

## 2015-01-10 NOTE — Patient Instructions (Signed)
[facility] Discharge Instructions for Patients Receiving Chemotherapy  Today you received the following chemotherapy agents; Alimta.   To help prevent nausea and vomiting after your treatment, we encourage you to take your nausea medication as directed.    If you develop nausea and vomiting that is not controlled by your nausea medication, call the clinic.   BELOW ARE SYMPTOMS THAT SHOULD BE REPORTED IMMEDIATELY:  *FEVER GREATER THAN 100.5 F  *CHILLS WITH OR WITHOUT FEVER  NAUSEA AND VOMITING THAT IS NOT CONTROLLED WITH YOUR NAUSEA MEDICATION  *UNUSUAL SHORTNESS OF BREATH  *UNUSUAL BRUISING OR BLEEDING  TENDERNESS IN MOUTH AND THROAT WITH OR WITHOUT PRESENCE OF ULCERS  *URINARY PROBLEMS  *BOWEL PROBLEMS  UNUSUAL RASH Items with * indicate a potential emergency and should be followed up as soon as possible.  Feel free to call the clinic you have any questions or concerns. The clinic phone number is [phone].  Please show the CHEMO ALERT CARD at check-in to the Emergency Department and triage nurse.   

## 2015-01-10 NOTE — Progress Notes (Signed)
[place] [phone]   [phone]  OFFICE PROGRESS NOTE  [name], MD [place] [address]  Diagnosis:  Metastatic non-small cell lung cancer initially diagnosed with locally advanced stage IIIB with right Pancoast tumor involving the vertebral body as well as the foraminal canal invasion with spinal cord compression in October of 2007.   Prior Therapy:  1. Status post concurrent chemoradiation with weekly carboplatin and paclitaxel, last dose was given November 18, 2005. 2. 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 3. Status post gamma knife radiotherapy to a solitary brain lesion located in the superior frontal area of the brain at [facility] in April of 2008. 4. Status post palliative radiotherapy to the lateral abdominal wall metastatic lesion under the care of Dr. Lisbeth Renshaw, completed March of 2009. 5. Status post 6 cycles of systemic chemotherapy with carboplatin and Alimta. Last dose was given July 26, 2007 with disease stabilization. 6. Gamma knife stereotactic radiotherapy to 2 brain lesions one involving the right frontal dural based as well as right parietal lesion performed on 05/07/2012 under the care of Dr. Vallarie Mare at [facility].  Current Therapy:  Maintenance chemotherapy with Alimta 500 mg per meter squared given every 3 weeks. The patient is status post 127 cycles.   CHEMOTHERAPY INTENT: Palliative/maintenance  CURRENT # OF CHEMOTHERAPY CYCLES: 128 CURRENT ANTIEMETICS: Zofran, dexamethasone and Compazine  CURRENT SMOKING STATUS: Quit smoking 10/07/2005  ORAL CHEMOTHERAPY AND CONSENT: None  CURRENT BISPHOSPHONATES USE: None  PAIN MANAGEMENT: 0/10 currently on morphine  NARCOTICS INDUCED CONSTIPATION: No constipation  LIVING WILL AND CODE STATUS: No CODE BLUE  INTERVAL HISTORY: Danielle [name] 65 y.o. female  returns to the clinic today for followup visit accompanied by her sister. The patient is feeling fine today with no specific complaints except for mild low back pain and she is requesting refill of her pain medication with MS Contin and morphine sulfate. She is tolerating her maintenance treatment with single agent Alimta fairly well with no significant adverse effects. She status post 127 cycles of treatment The patient denied having any chest pain, shortness of breath, or hemoptysis. No nausea or vomiting. The patient denied having any weight loss or night sweats. She had repeat CT scan of the chest, abdomen and pelvis performed recently and she is here for evaluation and discussion of her scan results before the next cycle of her treatment.  MEDICAL HISTORY: Past Medical History  Diagnosis Date  . Hypertension   . Anemia   . H/O: pneumonia   . History of tobacco abuse quit 10/08    on nicotine patch  . Fibromyalgia   . Hypokalemia   . DJD (degenerative joint disease), cervical   . Thrush 12/11/2010  . CVA (cerebral vascular accident) ([place]) 04/2014    pt states had 2 cva's within 2 wks  . Lung cancer ([place]) dx'd 09/2005    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 had chemo and radiation for lung ca    ALLERGIES:  is allergic to contrast media; iohexol; sulfa drugs cross reactors; and co-trimoxazole injection.  MEDICATIONS:  Current Outpatient Prescriptions  Medication Sig Dispense Refill  . ALPRAZolam (XANAX) 0.25 MG tablet Take 1 tablet (0.25 mg total) by mouth at bedtime as needed. for sleep 30 tablet 0  . amLODipine (NORVASC) 10 MG tablet TAKE 1 TABLET (10 MG TOTAL) BY MOUTH DAILY. 90 tablet 1  .  aspirin EC 325 MG tablet TAKE 1 TABLET (325 MG TOTAL) BY MOUTH DAILY. 30 tablet 0  . atorvastatin (LIPITOR) 40 MG tablet Take 1 tablet (40 mg total) by mouth daily at 6 PM. 90 tablet 3  . fluticasone (FLONASE) 50 MCG/ACT nasal spray USE 1-2 SPRAYS IN EACH NOSTRIL DAILY (Patient taking  differently: USE 1-2 SPRAYS IN EACH NOSTRIL DAILY PRN CONGESTION) 16 g 11  . folic acid (FOLVITE) 1 MG tablet TAKE 1 TABLET BY MOUTH DAILY. 90 tablet 1  . furosemide (LASIX) 20 MG tablet TAKE 1 TABLET (20 MG TOTAL) BY MOUTH DAILY AS NEEDED FOR EDEMA. 10 tablet 0  . lidocaine-prilocaine (EMLA) cream Apply to Port-A-Cath as directed 30 g 1  . lisinopril (PRINIVIL,ZESTRIL) 20 MG tablet TAKE 2 TABLETS (40 MG TOTAL) BY MOUTH DAILY. 60 tablet 5  . morphine (MS CONTIN) 60 MG 12 hr tablet Take 1 tablet (60 mg total) by mouth 2 (two) times daily. 60 tablet 0  . morphine (MSIR) 30 MG tablet Take 1 tablet (30 mg total) by mouth every 4 (four) hours as needed (breakthrough pain). 60 tablet 0  . Olopatadine HCl 0.2 % SOLN Apply 1 drop to eye daily. 2.5 mL 11  . ondansetron (ZOFRAN) 8 MG tablet TAKE 1 TABLET (8 MG TOTAL) BY MOUTH 2 (TWO) TIMES DAILY AS NEEDED FOR NAUSEA OR VOMITING. 30 tablet 2  . predniSONE (DELTASONE) 50 MG tablet Take 1 tablet (50 mg total) by mouth See admin instructions. Take 1 tablet (50 mg) 13 hours before CT scan, 7 hours before CT scan and 1 hour before CT scan (every 9 weeks) (Patient not taking: Reported on 01/10/2015) 3 tablet 1   No current facility-administered medications for this visit.    SURGICAL HISTORY:  Past Surgical History  Procedure Laterality Date  . Cervical laminectomy  1995  . Other surgical history  2008    Gamma knife surgery to remove brain met   . 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 Knee surgery  1990    Left x 2  . Portacath placement  -[name]    TIP IN CAVOATRIAL JUNCTION  . Loop recorder implant N/A 04/19/2014    Procedure: LOOP RECORDER IMPLANT;  Surgeon: Thompson Grayer, MD;  Location: [facility] CATH LAB;  Service: Cardiovascular;  Laterality: N/A;  . Tee without cardioversion N/A 04/19/2014    Procedure: TRANSESOPHAGEAL ECHOCARDIOGRAM (TEE);  Surgeon: Larey Dresser, MD;  Location: [place];  Service: Cardiovascular;   Laterality: N/A;    REVIEW OF SYSTEMS:  Constitutional: negative Eyes: negative Ears, nose, mouth, throat, and face: negative Respiratory: negative Cardiovascular: negative Gastrointestinal: negative Genitourinary:negative Integument/breast: negative Hematologic/lymphatic: negative Musculoskeletal:positive for back pain Neurological: negative Behavioral/Psych: negative Endocrine: negative Allergic/Immunologic: negative   PHYSICAL EXAMINATION: General appearance: alert, cooperative and no distress Head: Normocephalic, without obvious abnormality, atraumatic Neck: no adenopathy, no JVD, supple, symmetrical, trachea midline and thyroid not enlarged, symmetric, no tenderness/mass/nodules Lymph nodes: Cervical, supraclavicular, and axillary nodes normal. Resp: clear to auscultation bilaterally Back: symmetric, no curvature. ROM normal. No CVA tenderness. Cardio: regular rate and rhythm, S1, S2 normal, no murmur, click, rub or gallop GI: soft, non-tender; bowel sounds normal; no masses,  no organomegaly Extremities: extremities normal, atraumatic, no cyanosis or edema Neurologic: Alert and oriented X 3, normal strength and tone. Normal symmetric reflexes. Normal coordination and gait  ECOG PERFORMANCE STATUS: 0 - Asymptomatic  Blood pressure 128/55, pulse 76, temperature 98.3 F (36.8  C), temperature source Oral, resp. rate 18, height 5' 3" (1.6 m), weight 148 lb 6.4 oz (67.314 kg), SpO2 99 %.  LABORATORY DATA: Lab Results  Component Value Date   WBC 9.6 01/10/2015   HGB 10.9* 01/10/2015   HCT 33.5* 01/10/2015   MCV 89.4 01/10/2015   PLT 481* 01/10/2015      Chemistry      Component Value Date/Time   NA 141 01/10/2015 1055   NA 135 04/26/2014 1205   K 4.3 01/10/2015 1055   K 4.2 04/26/2014 1205   CL 103 04/26/2014 1205   CL 106 06/30/2012 1004   CO2 28 01/10/2015 1055   CO2 29 04/26/2014 1205   BUN 16.5 01/10/2015 1055   BUN 17 04/26/2014 1205   CREATININE 0.8  01/10/2015 1055   CREATININE 0.67 04/26/2014 1205      Component Value Date/Time   CALCIUM 9.4 01/10/2015 1055   CALCIUM 9.5 04/26/2014 1205   ALKPHOS 80 01/10/2015 1055   ALKPHOS 57 04/26/2014 1205   AST 18 01/10/2015 1055   AST 15 04/26/2014 1205   ALT 12 01/10/2015 1055   ALT 10 04/26/2014 1205   BILITOT <0.30 01/10/2015 1055   BILITOT 0.4 04/26/2014 1205       RADIOGRAPHIC STUDIES: Ct Chest W Contrast  01/06/2015  CLINICAL DATA:  Metastatic non-small cell lung carcinoma of the right upper lobe diagnosed in 2007, presenting for restaging on maintenance chemotherapy. EXAM: CT CHEST, ABDOMEN, AND PELVIS WITH CONTRAST TECHNIQUE: Multidetector CT imaging of the chest, abdomen and pelvis was performed following the standard protocol during bolus administration of intravenous contrast. CONTRAST:  166m OMNIPAQUE IOHEXOL 300 MG/ML  SOLN COMPARISON:  09/02/2014 CT chest, abdomen and pelvis. FINDINGS: CT CHEST Mediastinum/Nodes: Normal heart size. No pericardial fluid/thickening. Right internal jugular MediPort terminates at the cavoatrial junction. Coronary atherosclerosis. Great vessels are normal in course and caliber. No central pulmonary emboli. Partially calcified hypodense 0.5 cm left thyroid lobe nodule, stable. Normal esophagus. No pathologically enlarged axillary, mediastinal or hilar lymph nodes. Lungs/Pleura: No pneumothorax. No pleural effusion. There is a pleural-based 1.6 x 0.9 cm medial apical right upper lobe pulmonary nodule (series 5/ image 10), previously 1.7 x 0.9 cm, unchanged. There is a pleural-based 0.9 x 0.7 cm anterior right upper lobe pulmonary nodule (series 5/ image 18), unchanged. No acute consolidative airspace disease, new significant pulmonary nodules or lung masses. Mild-to-moderate centrilobular emphysema and diffuse bronchial wall thickening, unchanged. Musculoskeletal: No aggressive appearing focal osseous lesions. Stable severe T3 vertebral compression fracture  with essentially complete loss of vertebral body height. Stable vertebroplasty material in the T4 and T7 vertebral bodies. CT ABDOMEN AND PELVIS Hepatobiliary: Normal liver with no liver mass. Normal gallbladder with no radiopaque cholelithiasis. No biliary ductal dilatation. Pancreas: Normal, with no mass or duct dilation. Spleen: Normal size. No mass. Adrenals/Urinary Tract: Normal adrenals. Hypodense subcentimeter renal cortical lesion in the interpolar left kidney, too small to characterize, unchanged. No new renal masses. No hydronephrosis. Stable mild nonspecific urothelial wall thickening in the bilateral central renal collecting systems. Normal bladder. Stomach/Bowel: Grossly normal stomach. Normal caliber small bowel with no small bowel wall thickening. Normal appendix. Normal large bowel with no diverticulosis, large bowel wall thickening or pericolonic fat stranding. Vascular/Lymphatic: Atherosclerotic nonaneurysmal abdominal aorta. Patent portal, splenic, hepatic and renal veins. No pathologically enlarged lymph nodes in the abdomen or pelvis. Reproductive: Grossly normal uterus.  No adnexal mass. Other: No pneumoperitoneum, ascites or focal fluid collection. Musculoskeletal: No aggressive appearing focal osseous  lesions. IMPRESSION: 1. Interval stability of two small pleural-based right upper lobe pulmonary nodules. 2. No new sites of metastatic disease in the chest, abdomen or pelvis. 3. Mild-to-moderate centrilobular emphysema and diffuse bronchial wall thickening, suggesting COPD. Electronically Signed   By: Ilona Sorrel M.D.   On: 01/06/2015 09:45   Ct Abdomen Pelvis W Contrast  01/06/2015  CLINICAL DATA:  Metastatic non-small cell lung carcinoma of the right upper lobe diagnosed in 2007, presenting for restaging on maintenance chemotherapy. EXAM: CT CHEST, ABDOMEN, AND PELVIS WITH CONTRAST TECHNIQUE: Multidetector CT imaging of the chest, abdomen and pelvis was performed following the standard  protocol during bolus administration of intravenous contrast. CONTRAST:  125m OMNIPAQUE IOHEXOL 300 MG/ML  SOLN COMPARISON:  09/02/2014 CT chest, abdomen and pelvis. FINDINGS: CT CHEST Mediastinum/Nodes: Normal heart size. No pericardial fluid/thickening. Right internal jugular MediPort terminates at the cavoatrial junction. Coronary atherosclerosis. Great vessels are normal in course and caliber. No central pulmonary emboli. Partially calcified hypodense 0.5 cm left thyroid lobe nodule, stable. Normal esophagus. No pathologically enlarged axillary, mediastinal or hilar lymph nodes. Lungs/Pleura: No pneumothorax. No pleural effusion. There is a pleural-based 1.6 x 0.9 cm medial apical right upper lobe pulmonary nodule (series 5/ image 10), previously 1.7 x 0.9 cm, unchanged. There is a pleural-based 0.9 x 0.7 cm anterior right upper lobe pulmonary nodule (series 5/ image 18), unchanged. No acute consolidative airspace disease, new significant pulmonary nodules or lung masses. Mild-to-moderate centrilobular emphysema and diffuse bronchial wall thickening, unchanged. Musculoskeletal: No aggressive appearing focal osseous lesions. Stable severe T3 vertebral compression fracture with essentially complete loss of vertebral body height. Stable vertebroplasty material in the T4 and T7 vertebral bodies. CT ABDOMEN AND PELVIS Hepatobiliary: Normal liver with no liver mass. Normal gallbladder with no radiopaque cholelithiasis. No biliary ductal dilatation. Pancreas: Normal, with no mass or duct dilation. Spleen: Normal size. No mass. Adrenals/Urinary Tract: Normal adrenals. Hypodense subcentimeter renal cortical lesion in the interpolar left kidney, too small to characterize, unchanged. No new renal masses. No hydronephrosis. Stable mild nonspecific urothelial wall thickening in the bilateral central renal collecting systems. Normal bladder. Stomach/Bowel: Grossly normal stomach. Normal caliber small bowel with no small  bowel wall thickening. Normal appendix. Normal large bowel with no diverticulosis, large bowel wall thickening or pericolonic fat stranding. Vascular/Lymphatic: Atherosclerotic nonaneurysmal abdominal aorta. Patent portal, splenic, hepatic and renal veins. No pathologically enlarged lymph nodes in the abdomen or pelvis. Reproductive: Grossly normal uterus.  No adnexal mass. Other: No pneumoperitoneum, ascites or focal fluid collection. Musculoskeletal: No aggressive appearing focal osseous lesions. IMPRESSION: 1. Interval stability of two small pleural-based right upper lobe pulmonary nodules. 2. No new sites of metastatic disease in the chest, abdomen or pelvis. 3. Mild-to-moderate centrilobular emphysema and diffuse bronchial wall thickening, suggesting COPD. Electronically Signed   By: JIlona SorrelM.D.   On: 01/06/2015 09:45   ASSESSMENT AND PLAN: This is a very pleasant 65years old white female with metastatic non-small cell lung cancer currently undergoing maintenance chemotherapy with single agent Alimta status post 127 cycles. The patient is tolerating her treatment fairly well with no significant adverse effects.  The recent CT scan of the chest, abdomen and pelvis showed no evidence for disease progression. I discussed the scan results with the patient and her sister. I recommended for her to continue with her maintenance chemotherapy and the patient will receive cycle #128 today. The patient would come back for followup visit in 3 weeks for evaluation before starting cycle #128 after repeating  CT scan of the chest, abdomen and pelvis for restaging of her disease. For pain management, she will continue on her current pain medication with MS Contin and MS IR and she was given a refill of these medications.  She was advised to call immediately if she has any concerning symptoms in the interval. The patient voices understanding of current disease status and treatment options and is in agreement with  the current care plan.  All questions were answered. The patient knows to call the clinic with any problems, questions or concerns. We can certainly see the patient much sooner if necessary.  Disclaimer: This note was dictated with voice recognition software. Similar sounding words can inadvertently be transcribed and may not be corrected upon review.

## 2015-01-11 ENCOUNTER — Telehealth: Payer: Self-pay | Admitting: Internal Medicine

## 2015-01-11 NOTE — Telephone Encounter (Signed)
No new orders given on 01/10/15. Pt is already scheuled for 1/24 + 2/14.

## 2015-01-12 ENCOUNTER — Other Ambulatory Visit: Payer: Self-pay | Admitting: Internal Medicine

## 2015-01-16 ENCOUNTER — Ambulatory Visit (INDEPENDENT_AMBULATORY_CARE_PROVIDER_SITE_OTHER): Payer: Medicare Other | Admitting: *Deleted

## 2015-01-16 DIAGNOSIS — I639 Cerebral infarction, unspecified: Secondary | ICD-10-CM

## 2015-01-16 NOTE — Progress Notes (Signed)
[facility] Summary Report / Loop Recorder 

## 2015-01-18 LAB — CUP PACEART REMOTE DEVICE CHECK: Date Time Interrogation Session: 20161208200815

## 2015-01-18 NOTE — Progress Notes (Signed)
[facility] summary report received. Battery status OK. Normal device function. No new symptom episodes, tachy episodes, brady, or pause episodes. No new AF episodes. Monthly summary reports and ROV/PRN 

## 2015-01-26 ENCOUNTER — Telehealth: Payer: Self-pay | Admitting: Family Medicine

## 2015-01-26 ENCOUNTER — Other Ambulatory Visit (INDEPENDENT_AMBULATORY_CARE_PROVIDER_SITE_OTHER): Payer: Medicare Other

## 2015-01-26 DIAGNOSIS — E119 Type 2 diabetes mellitus without complications: Secondary | ICD-10-CM | POA: Diagnosis not present

## 2015-01-26 LAB — COMPREHENSIVE METABOLIC PANEL
ALT: 15 U/L (ref 0–35)
AST: 18 U/L (ref 0–37)
Albumin: 3.4 g/dL — ABNORMAL LOW (ref 3.5–5.2)
Alkaline Phosphatase: 79 U/L (ref 39–117)
BILIRUBIN TOTAL: 0.4 mg/dL (ref 0.2–1.2)
BUN: 13 mg/dL (ref 6–23)
CHLORIDE: 103 meq/L (ref 96–112)
CO2: 28 meq/L (ref 19–32)
CREATININE: 0.93 mg/dL (ref 0.40–1.20)
Calcium: 9 mg/dL (ref 8.4–10.5)
GFR: 64.82 mL/min (ref >60.00)
GLUCOSE: 135 mg/dL — AB (ref 70–99)
Potassium: 4.1 mEq/L (ref 3.5–5.1)
Sodium: 139 mEq/L (ref 135–145)
Total Protein: 6.6 g/dL (ref 6.0–8.3)

## 2015-01-26 LAB — LIPID PANEL
CHOL/HDL RATIO: 4
Cholesterol: 114 mg/dL (ref 0–200)
HDL: 30.5 mg/dL — ABNORMAL LOW (ref >39.00)
LDL Cholesterol: 61 mg/dL (ref 0–99)
NONHDL: 83.27
Triglycerides: 110 mg/dL (ref 0.0–149.0)
VLDL: 22 mg/dL (ref 0.0–40.0)

## 2015-01-26 LAB — HEMOGLOBIN A1C: Hgb A1c MFr Bld: 5.9 % (ref 4.6–6.5)

## 2015-01-26 NOTE — Telephone Encounter (Signed)
-----   Message from Ellamae Sia sent at 01/16/2015  3:34 PM EST ----- Regarding: Lab orders for Thursday, 1.19.17 Lab orders for a 6 month follow up appt

## 2015-01-31 ENCOUNTER — Other Ambulatory Visit: Payer: Self-pay | Admitting: Medical Oncology

## 2015-01-31 ENCOUNTER — Other Ambulatory Visit (HOSPITAL_BASED_OUTPATIENT_CLINIC_OR_DEPARTMENT_OTHER): Payer: Medicare Other

## 2015-01-31 ENCOUNTER — Ambulatory Visit (HOSPITAL_BASED_OUTPATIENT_CLINIC_OR_DEPARTMENT_OTHER): Payer: Medicare Other | Admitting: Internal Medicine

## 2015-01-31 ENCOUNTER — Telehealth: Payer: Self-pay | Admitting: *Deleted

## 2015-01-31 ENCOUNTER — Telehealth: Payer: Self-pay | Admitting: Internal Medicine

## 2015-01-31 ENCOUNTER — Encounter: Payer: Self-pay | Admitting: Internal Medicine

## 2015-01-31 ENCOUNTER — Ambulatory Visit (HOSPITAL_BASED_OUTPATIENT_CLINIC_OR_DEPARTMENT_OTHER): Payer: Medicare Other

## 2015-01-31 VITALS — BP 122/56 | HR 87 | Temp 98.2°F | Resp 17 | Ht 63.0 in | Wt 149.9 lb

## 2015-01-31 DIAGNOSIS — C3490 Malignant neoplasm of unspecified part of unspecified bronchus or lung: Secondary | ICD-10-CM

## 2015-01-31 DIAGNOSIS — C3411 Malignant neoplasm of upper lobe, right bronchus or lung: Secondary | ICD-10-CM

## 2015-01-31 DIAGNOSIS — C7931 Secondary malignant neoplasm of brain: Secondary | ICD-10-CM | POA: Diagnosis not present

## 2015-01-31 DIAGNOSIS — Z5111 Encounter for antineoplastic chemotherapy: Secondary | ICD-10-CM

## 2015-01-31 LAB — CBC WITH DIFFERENTIAL/PLATELET
BASO%: 0.5 % (ref 0.0–2.0)
BASOS ABS: 0.1 10*3/uL (ref 0.0–0.1)
EOS ABS: 0 10*3/uL (ref 0.0–0.5)
EOS%: 0.5 % (ref 0.0–7.0)
HEMATOCRIT: 35.1 % (ref 34.8–46.6)
HEMOGLOBIN: 11.5 g/dL — AB (ref 11.6–15.9)
LYMPH#: 2 10*3/uL (ref 0.9–3.3)
LYMPH%: 20.4 % (ref 14.0–49.7)
MCH: 29.7 pg (ref 25.1–34.0)
MCHC: 32.8 g/dL (ref 31.5–36.0)
MCV: 90.8 fL (ref 79.5–101.0)
MONO#: 1 10*3/uL — ABNORMAL HIGH (ref 0.1–0.9)
MONO%: 10.7 % (ref 0.0–14.0)
NEUT#: 6.5 10*3/uL (ref 1.5–6.5)
NEUT%: 67.9 % (ref 38.4–76.8)
Platelets: 387 10*3/uL (ref 145–400)
RBC: 3.86 10*6/uL (ref 3.70–5.45)
RDW: 16.3 % — AB (ref 11.2–14.5)
WBC: 9.6 10*3/uL (ref 3.9–10.3)

## 2015-01-31 LAB — COMPREHENSIVE METABOLIC PANEL
ALK PHOS: 89 U/L (ref 40–150)
ALT: 10 U/L (ref 0–55)
AST: 18 U/L (ref 5–34)
Albumin: 3.3 g/dL — ABNORMAL LOW (ref 3.5–5.0)
Anion Gap: 7 mEq/L (ref 3–11)
BUN: 10.5 mg/dL (ref 7.0–26.0)
CO2: 28 mEq/L (ref 22–29)
CREATININE: 0.9 mg/dL (ref 0.6–1.1)
Calcium: 9.7 mg/dL (ref 8.4–10.4)
Chloride: 107 mEq/L (ref 98–109)
EGFR: 70 mL/min/{1.73_m2} — ABNORMAL LOW (ref >90)
Glucose: 108 mg/dl (ref 70–140)
Potassium: 4.2 mEq/L (ref 3.5–5.1)
SODIUM: 142 meq/L (ref 136–145)
Total Bilirubin: 0.45 mg/dL (ref 0.20–1.20)
Total Protein: 6.9 g/dL (ref 6.4–8.3)

## 2015-01-31 MED ORDER — SODIUM CHLORIDE 0.9 % IV SOLN
500.0000 mg/m2 | Freq: Once | INTRAVENOUS | Status: AC
  Administered 2015-01-31: 900 mg via INTRAVENOUS
  Filled 2015-01-31: qty 32

## 2015-01-31 MED ORDER — SODIUM CHLORIDE 0.9 % IV SOLN
Freq: Once | INTRAVENOUS | Status: AC
  Administered 2015-01-31: 12:00:00 via INTRAVENOUS
  Filled 2015-01-31: qty 4

## 2015-01-31 MED ORDER — CYANOCOBALAMIN 1000 MCG/ML IJ SOLN
INTRAMUSCULAR | Status: AC
  Filled 2015-01-31: qty 1

## 2015-01-31 MED ORDER — SODIUM CHLORIDE 0.9 % IV SOLN
Freq: Once | INTRAVENOUS | Status: AC
  Administered 2015-01-31: 12:00:00 via INTRAVENOUS

## 2015-01-31 MED ORDER — HEPARIN SOD (PORK) LOCK FLUSH 100 UNIT/ML IV SOLN
500.0000 [IU] | Freq: Once | INTRAVENOUS | Status: AC | PRN
  Administered 2015-01-31: 500 [IU]
  Filled 2015-01-31: qty 5

## 2015-01-31 MED ORDER — CYANOCOBALAMIN 1000 MCG/ML IJ SOLN
1000.0000 ug | Freq: Once | INTRAMUSCULAR | Status: AC
  Administered 2015-01-31: 1000 ug via INTRAMUSCULAR

## 2015-01-31 MED ORDER — SODIUM CHLORIDE 0.9 % IJ SOLN
10.0000 mL | INTRAMUSCULAR | Status: DC | PRN
  Administered 2015-01-31: 10 mL
  Filled 2015-01-31: qty 10

## 2015-01-31 NOTE — Telephone Encounter (Signed)
Per staff message and POF I have scheduled appts. Advised scheduler of appts. JMW  

## 2015-01-31 NOTE — Patient Instructions (Signed)
[facility] Discharge Instructions for Patients Receiving Chemotherapy  Today you received the following chemotherapy agents; Alimta.   To help prevent nausea and vomiting after your treatment, we encourage you to take your nausea medication as directed.    If you develop nausea and vomiting that is not controlled by your nausea medication, call the clinic.   BELOW ARE SYMPTOMS THAT SHOULD BE REPORTED IMMEDIATELY:  *FEVER GREATER THAN 100.5 F  *CHILLS WITH OR WITHOUT FEVER  NAUSEA AND VOMITING THAT IS NOT CONTROLLED WITH YOUR NAUSEA MEDICATION  *UNUSUAL SHORTNESS OF BREATH  *UNUSUAL BRUISING OR BLEEDING  TENDERNESS IN MOUTH AND THROAT WITH OR WITHOUT PRESENCE OF ULCERS  *URINARY PROBLEMS  *BOWEL PROBLEMS  UNUSUAL RASH Items with * indicate a potential emergency and should be followed up as soon as possible.  Feel free to call the clinic you have any questions or concerns. The clinic phone number is [phone].  Please show the CHEMO ALERT CARD at check-in to the Emergency Department and triage nurse.   

## 2015-01-31 NOTE — Telephone Encounter (Signed)
Talked with and scheduled this patient’s appointment(s) while patient was here in our office.       [facility]. °

## 2015-01-31 NOTE — Progress Notes (Signed)
[place] [phone]   [phone]  OFFICE PROGRESS NOTE  [name], MD [place] [address]  Diagnosis:  Metastatic non-small cell lung cancer initially diagnosed with locally advanced stage IIIB with right Pancoast tumor involving the vertebral body as well as the foraminal canal invasion with spinal cord compression in October of 2007.   Prior Therapy:  1. Status post concurrent chemoradiation with weekly carboplatin and paclitaxel, last dose was given November 18, 2005. 2. 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 3. Status post gamma knife radiotherapy to a solitary brain lesion located in the superior frontal area of the brain at [facility] in April of 2008. 4. Status post palliative radiotherapy to the lateral abdominal wall metastatic lesion under the care of Dr. Lisbeth Renshaw, completed March of 2009. 5. Status post 6 cycles of systemic chemotherapy with carboplatin and Alimta. Last dose was given July 26, 2007 with disease stabilization. 6. Gamma knife stereotactic radiotherapy to 2 brain lesions one involving the right frontal dural based as well as right parietal lesion performed on 05/07/2012 under the care of Dr. Vallarie Mare at [facility].  Current Therapy:  Maintenance chemotherapy with Alimta 500 mg per meter squared given every 3 weeks. The patient is status post 128 cycles.   CHEMOTHERAPY INTENT: Palliative/maintenance  CURRENT # OF CHEMOTHERAPY CYCLES: 129 CURRENT ANTIEMETICS: Zofran, dexamethasone and Compazine  CURRENT SMOKING STATUS: Quit smoking 10/07/2005  ORAL CHEMOTHERAPY AND CONSENT: None  CURRENT BISPHOSPHONATES USE: None  PAIN MANAGEMENT: 0/10 currently on morphine  NARCOTICS INDUCED CONSTIPATION: No constipation  LIVING WILL AND CODE STATUS: No CODE BLUE  INTERVAL HISTORY: Danielle [name] 65 y.o. female  returns to the clinic today for followup visit. The patient is feeling fine today with no specific complaints except for mild low back pain. She is currently on pain medication with MS Contin and morphine sulfate. She is tolerating her maintenance treatment with single agent Alimta fairly well with no significant adverse effects. The patient denied having any chest pain, shortness of breath, or hemoptysis. No nausea or vomiting. The patient denied having any weight loss or night sweats. She is here today to start cycle #129 her maintenance chemotherapy.  MEDICAL HISTORY: Past Medical History  Diagnosis Date  . Hypertension   . Anemia   . H/O: pneumonia   . History of tobacco abuse quit 10/08    on nicotine patch  . Fibromyalgia   . Hypokalemia   . DJD (degenerative joint disease), cervical   . Thrush 12/11/2010  . CVA (cerebral vascular accident) ([place]) 04/2014    pt states had 2 cva's within 2 wks  . Lung cancer ([place]) dx'd 09/2005    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 had chemo and radiation for lung ca    ALLERGIES:  is allergic to contrast media; iohexol; sulfa drugs cross reactors; and co-trimoxazole injection.  MEDICATIONS:  Current Outpatient Prescriptions  Medication Sig Dispense Refill  . ALPRAZolam (XANAX) 0.25 MG tablet Take 1 tablet (0.25 mg total) by mouth at bedtime as needed. for sleep 30 tablet 0  . amLODipine (NORVASC) 10 MG tablet TAKE 1 TABLET (10 MG TOTAL) BY MOUTH DAILY. 90 tablet 1  . aspirin EC 325 MG tablet TAKE 1 TABLET (325 MG TOTAL) BY MOUTH DAILY. 30 tablet 0  . atorvastatin (LIPITOR) 40 MG tablet Take 1 tablet (40 mg total) by mouth daily  at 6 PM. 90 tablet 3  . fluticasone (FLONASE) 50 MCG/ACT nasal spray USE 1-2 SPRAYS IN EACH NOSTRIL DAILY (Patient taking differently: USE 1-2 SPRAYS IN EACH NOSTRIL DAILY PRN CONGESTION) 16 g 11  . folic acid (FOLVITE) 1 MG tablet TAKE 1 TABLET BY MOUTH DAILY. 90 tablet 1  . furosemide (LASIX) 20 MG tablet TAKE 1  TABLET (20 MG TOTAL) BY MOUTH DAILY AS NEEDED FOR EDEMA. 10 tablet 0  . lidocaine-prilocaine (EMLA) cream Apply to Port-A-Cath as directed 30 g 1  . lisinopril (PRINIVIL,ZESTRIL) 20 MG tablet TAKE 2 TABLETS (40 MG TOTAL) BY MOUTH DAILY. 60 tablet 5  . morphine (MS CONTIN) 60 MG 12 hr tablet Take 1 tablet (60 mg total) by mouth 2 (two) times daily. 60 tablet 0  . morphine (MSIR) 30 MG tablet Take 1 tablet (30 mg total) by mouth every 4 (four) hours as needed (breakthrough pain). 60 tablet 0  . Olopatadine HCl 0.2 % SOLN Apply 1 drop to eye daily. 2.5 mL 11  . ondansetron (ZOFRAN) 8 MG tablet TAKE 1 TABLET (8 MG TOTAL) BY MOUTH 2 (TWO) TIMES DAILY AS NEEDED FOR NAUSEA OR VOMITING. 30 tablet 2  . predniSONE (DELTASONE) 50 MG tablet Take 1 tablet (50 mg total) by mouth See admin instructions. Take 1 tablet (50 mg) 13 hours before CT scan, 7 hours before CT scan and 1 hour before CT scan (every 9 weeks) (Patient not taking: Reported on 01/10/2015) 3 tablet 1   No current facility-administered medications for this visit.    SURGICAL HISTORY:  Past Surgical History  Procedure Laterality Date  . Cervical laminectomy  1995  . Other surgical history  2008    Gamma knife surgery to remove brain met   . 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 Knee surgery  1990    Left x 2  . Portacath placement  -[name]    TIP IN CAVOATRIAL JUNCTION  . Loop recorder implant N/A 04/19/2014    Procedure: LOOP RECORDER IMPLANT;  Surgeon: Thompson Grayer, MD;  Location: [facility] CATH LAB;  Service: Cardiovascular;  Laterality: N/A;  . Tee without cardioversion N/A 04/19/2014    Procedure: TRANSESOPHAGEAL ECHOCARDIOGRAM (TEE);  Surgeon: Larey Dresser, MD;  Location: [facility];  Service: Cardiovascular;  Laterality: N/A;    REVIEW OF SYSTEMS:  A comprehensive review of systems was negative except for: Musculoskeletal: positive for back pain   PHYSICAL EXAMINATION: General appearance: alert,  cooperative and no distress Head: Normocephalic, without obvious abnormality, atraumatic Neck: no adenopathy, no JVD, supple, symmetrical, trachea midline and thyroid not enlarged, symmetric, no tenderness/mass/nodules Lymph nodes: Cervical, supraclavicular, and axillary nodes normal. Resp: clear to auscultation bilaterally Back: symmetric, no curvature. ROM normal. No CVA tenderness. Cardio: regular rate and rhythm, S1, S2 normal, no murmur, click, rub or gallop GI: soft, non-tender; bowel sounds normal; no masses,  no organomegaly Extremities: extremities normal, atraumatic, no cyanosis or edema Neurologic: Alert and oriented X 3, normal strength and tone. Normal symmetric reflexes. Normal coordination and gait  ECOG PERFORMANCE STATUS: 0 - Asymptomatic  Blood pressure 122/56, pulse 87, temperature 98.2 F (36.8 C), temperature source Oral, resp. rate 17, height '5\' 3"'$  (1.6 m), weight 149 lb 14.4 oz (67.994 kg), SpO2 95 %.  LABORATORY DATA: Lab Results  Component Value Date   WBC 9.6 01/31/2015   HGB 11.5* 01/31/2015   HCT 35.1 01/31/2015   MCV 90.8 01/31/2015  PLT 387 01/31/2015      Chemistry      Component Value Date/Time   NA 139 01/26/2015 0818   NA 141 01/10/2015 1055   K 4.1 01/26/2015 0818   K 4.3 01/10/2015 1055   CL 103 01/26/2015 0818   CL 106 06/30/2012 1004   CO2 28 01/26/2015 0818   CO2 28 01/10/2015 1055   BUN 13 01/26/2015 0818   BUN 16.5 01/10/2015 1055   CREATININE 0.93 01/26/2015 0818   CREATININE 0.8 01/10/2015 1055      Component Value Date/Time   CALCIUM 9.0 01/26/2015 0818   CALCIUM 9.4 01/10/2015 1055   ALKPHOS 79 01/26/2015 0818   ALKPHOS 80 01/10/2015 1055   AST 18 01/26/2015 0818   AST 18 01/10/2015 1055   ALT 15 01/26/2015 0818   ALT 12 01/10/2015 1055   BILITOT 0.4 01/26/2015 0818   BILITOT <0.30 01/10/2015 1055       RADIOGRAPHIC STUDIES: Ct Chest W Contrast  01/06/2015  CLINICAL DATA:  Metastatic non-small cell lung  carcinoma of the right upper lobe diagnosed in 2007, presenting for restaging on maintenance chemotherapy. EXAM: CT CHEST, ABDOMEN, AND PELVIS WITH CONTRAST TECHNIQUE: Multidetector CT imaging of the chest, abdomen and pelvis was performed following the standard protocol during bolus administration of intravenous contrast. CONTRAST:  143m OMNIPAQUE IOHEXOL 300 MG/ML  SOLN COMPARISON:  09/02/2014 CT chest, abdomen and pelvis. FINDINGS: CT CHEST Mediastinum/Nodes: Normal heart size. No pericardial fluid/thickening. Right internal jugular MediPort terminates at the cavoatrial junction. Coronary atherosclerosis. Great vessels are normal in course and caliber. No central pulmonary emboli. Partially calcified hypodense 0.5 cm left thyroid lobe nodule, stable. Normal esophagus. No pathologically enlarged axillary, mediastinal or hilar lymph nodes. Lungs/Pleura: No pneumothorax. No pleural effusion. There is a pleural-based 1.6 x 0.9 cm medial apical right upper lobe pulmonary nodule (series 5/ image 10), previously 1.7 x 0.9 cm, unchanged. There is a pleural-based 0.9 x 0.7 cm anterior right upper lobe pulmonary nodule (series 5/ image 18), unchanged. No acute consolidative airspace disease, new significant pulmonary nodules or lung masses. Mild-to-moderate centrilobular emphysema and diffuse bronchial wall thickening, unchanged. Musculoskeletal: No aggressive appearing focal osseous lesions. Stable severe T3 vertebral compression fracture with essentially complete loss of vertebral body height. Stable vertebroplasty material in the T4 and T7 vertebral bodies. CT ABDOMEN AND PELVIS Hepatobiliary: Normal liver with no liver mass. Normal gallbladder with no radiopaque cholelithiasis. No biliary ductal dilatation. Pancreas: Normal, with no mass or duct dilation. Spleen: Normal size. No mass. Adrenals/Urinary Tract: Normal adrenals. Hypodense subcentimeter renal cortical lesion in the interpolar left kidney, too small to  characterize, unchanged. No new renal masses. No hydronephrosis. Stable mild nonspecific urothelial wall thickening in the bilateral central renal collecting systems. Normal bladder. Stomach/Bowel: Grossly normal stomach. Normal caliber small bowel with no small bowel wall thickening. Normal appendix. Normal large bowel with no diverticulosis, large bowel wall thickening or pericolonic fat stranding. Vascular/Lymphatic: Atherosclerotic nonaneurysmal abdominal aorta. Patent portal, splenic, hepatic and renal veins. No pathologically enlarged lymph nodes in the abdomen or pelvis. Reproductive: Grossly normal uterus.  No adnexal mass. Other: No pneumoperitoneum, ascites or focal fluid collection. Musculoskeletal: No aggressive appearing focal osseous lesions. IMPRESSION: 1. Interval stability of two small pleural-based right upper lobe pulmonary nodules. 2. No new sites of metastatic disease in the chest, abdomen or pelvis. 3. Mild-to-moderate centrilobular emphysema and diffuse bronchial wall thickening, suggesting COPD. Electronically Signed   By: JIlona SorrelM.D.   On: 01/06/2015 09:45  Ct Abdomen Pelvis W Contrast  01/06/2015  CLINICAL DATA:  Metastatic non-small cell lung carcinoma of the right upper lobe diagnosed in 2007, presenting for restaging on maintenance chemotherapy. EXAM: CT CHEST, ABDOMEN, AND PELVIS WITH CONTRAST TECHNIQUE: Multidetector CT imaging of the chest, abdomen and pelvis was performed following the standard protocol during bolus administration of intravenous contrast. CONTRAST:  134m OMNIPAQUE IOHEXOL 300 MG/ML  SOLN COMPARISON:  09/02/2014 CT chest, abdomen and pelvis. FINDINGS: CT CHEST Mediastinum/Nodes: Normal heart size. No pericardial fluid/thickening. Right internal jugular MediPort terminates at the cavoatrial junction. Coronary atherosclerosis. Great vessels are normal in course and caliber. No central pulmonary emboli. Partially calcified hypodense 0.5 cm left thyroid lobe  nodule, stable. Normal esophagus. No pathologically enlarged axillary, mediastinal or hilar lymph nodes. Lungs/Pleura: No pneumothorax. No pleural effusion. There is a pleural-based 1.6 x 0.9 cm medial apical right upper lobe pulmonary nodule (series 5/ image 10), previously 1.7 x 0.9 cm, unchanged. There is a pleural-based 0.9 x 0.7 cm anterior right upper lobe pulmonary nodule (series 5/ image 18), unchanged. No acute consolidative airspace disease, new significant pulmonary nodules or lung masses. Mild-to-moderate centrilobular emphysema and diffuse bronchial wall thickening, unchanged. Musculoskeletal: No aggressive appearing focal osseous lesions. Stable severe T3 vertebral compression fracture with essentially complete loss of vertebral body height. Stable vertebroplasty material in the T4 and T7 vertebral bodies. CT ABDOMEN AND PELVIS Hepatobiliary: Normal liver with no liver mass. Normal gallbladder with no radiopaque cholelithiasis. No biliary ductal dilatation. Pancreas: Normal, with no mass or duct dilation. Spleen: Normal size. No mass. Adrenals/Urinary Tract: Normal adrenals. Hypodense subcentimeter renal cortical lesion in the interpolar left kidney, too small to characterize, unchanged. No new renal masses. No hydronephrosis. Stable mild nonspecific urothelial wall thickening in the bilateral central renal collecting systems. Normal bladder. Stomach/Bowel: Grossly normal stomach. Normal caliber small bowel with no small bowel wall thickening. Normal appendix. Normal large bowel with no diverticulosis, large bowel wall thickening or pericolonic fat stranding. Vascular/Lymphatic: Atherosclerotic nonaneurysmal abdominal aorta. Patent portal, splenic, hepatic and renal veins. No pathologically enlarged lymph nodes in the abdomen or pelvis. Reproductive: Grossly normal uterus.  No adnexal mass. Other: No pneumoperitoneum, ascites or focal fluid collection. Musculoskeletal: No aggressive appearing focal  osseous lesions. IMPRESSION: 1. Interval stability of two small pleural-based right upper lobe pulmonary nodules. 2. No new sites of metastatic disease in the chest, abdomen or pelvis. 3. Mild-to-moderate centrilobular emphysema and diffuse bronchial wall thickening, suggesting COPD. Electronically Signed   By: JIlona SorrelM.D.   On: 01/06/2015 09:45   ASSESSMENT AND PLAN: This is a very pleasant 65years old white female with metastatic non-small cell lung cancer currently undergoing maintenance chemotherapy with single agent Alimta status post 128 cycles. The patient is tolerating her treatment fairly well with no significant adverse effects.  I recommended for her to continue with her maintenance chemotherapy and the patient will receive cycle #129 today. For pain management, she will continue on her current pain medication with MS Contin and MS IR. She was advised to call immediately if she has any concerning symptoms in the interval. The patient voices understanding of current disease status and treatment options and is in agreement with the current care plan.  All questions were answered. The patient knows to call the clinic with any problems, questions or concerns. We can certainly see the patient much sooner if necessary.  Disclaimer: This note was dictated with voice recognition software. Similar sounding words can inadvertently be transcribed and may not  be corrected upon review.

## 2015-02-02 ENCOUNTER — Ambulatory Visit (INDEPENDENT_AMBULATORY_CARE_PROVIDER_SITE_OTHER): Payer: Medicare Other | Admitting: Family Medicine

## 2015-02-02 ENCOUNTER — Encounter: Payer: Self-pay | Admitting: Family Medicine

## 2015-02-02 VITALS — BP 101/43 | HR 67 | Temp 97.6°F | Ht 63.0 in | Wt 148.5 lb

## 2015-02-02 DIAGNOSIS — E119 Type 2 diabetes mellitus without complications: Secondary | ICD-10-CM

## 2015-02-02 DIAGNOSIS — I10 Essential (primary) hypertension: Secondary | ICD-10-CM

## 2015-02-02 DIAGNOSIS — E7800 Pure hypercholesterolemia, unspecified: Secondary | ICD-10-CM | POA: Diagnosis not present

## 2015-02-02 DIAGNOSIS — I639 Cerebral infarction, unspecified: Secondary | ICD-10-CM | POA: Diagnosis not present

## 2015-02-02 DIAGNOSIS — C3490 Malignant neoplasm of unspecified part of unspecified bronchus or lung: Secondary | ICD-10-CM

## 2015-02-02 LAB — HM DIABETES FOOT EXAM

## 2015-02-02 NOTE — Assessment & Plan Note (Signed)
Stable in treatment immunocompromised.

## 2015-02-02 NOTE — Assessment & Plan Note (Signed)
Resolved symptoms. On  ASA as anticoagulant.

## 2015-02-02 NOTE — Assessment & Plan Note (Signed)
Well controlled. Continue current medication.  

## 2015-02-02 NOTE — Patient Instructions (Addendum)
Increase veggie fats in place of animal fats.  Increase exercise as tolerated.  Make sure to drink water. Make sure you get yearly eye exam.

## 2015-02-02 NOTE — Progress Notes (Signed)
Pre visit review using our clinic review tool, if applicable. No additional management support is needed unless otherwise documented below in the visit note. 

## 2015-02-02 NOTE — Assessment & Plan Note (Signed)
Now at new goal LDL < 70 on lipitor 40 mg daily. Encouraged exercise, weight loss, healthy eating habits to try to raise HDL.

## 2015-02-02 NOTE — Progress Notes (Signed)
65 year old female with metastatic lung cancer presents forDM follow up. On treatment #129. Stable.  Her speech is back to normal after CVA, she has memory back. Now she is driving again.  Diabetes: Good control on no med. Lab Results  Component Value Date   HGBA1C 5.9 01/26/2015  Using medications without difficulties: on none Hypoglycemic episodes: not chekcing Hyperglycemic episodes: not checking Feet problems: no ulcers Blood Sugars averaging: ? eye exam within last year: due  Elevated Cholesterol: Now at goal LDL on Lipitor  40 mg daily. New CVA goal < 70 since CVA in 2016   Lab Results  Component Value Date   CHOL 114 01/26/2015   HDL 30.50* 01/26/2015   LDLCALC 61 01/26/2015   LDLDIRECT 105.5 08/06/2013   TRIG 110.0 01/26/2015   CHOLHDL 4 01/26/2015   Using medications without problems: no SE. Muscle aches: None Minimal exercise, good diet. Other complaints:  Hypertension:  At goal on amlodipine, lisinopril  BP Readings from Last 3 Encounters:  02/02/15 101/43  01/31/15 122/56  01/10/15 128/55  Using medication without problems or lightheadedness: None  Chest pain with exertion: None Edema: None Short of breath: Stable. Average home BPs: none Other issues:  Review of Systems  Constitutional: Negative for fever and fatigue.  HENT: Negative for ear pain.  Eyes: Negative for pain.  Respiratory: Negative for chest tightness and shortness of breath.  Cardiovascular: Negative for chest pain, palpitations and leg swelling.  Gastrointestinal: Negative for abdominal pain.  Genitourinary: Negative for dysuria.      Objective:  Physical Exam  Constitutional: Vital signs are normal. She appears well-developed and well-nourished. She is cooperative. Non-toxic appearance. She does not appear ill. No distress.  HENT:  Head: Normocephalic.  Right Ear: Hearing, tympanic membrane, external ear and ear canal normal. Tympanic membrane is not  erythematous, not retracted and not bulging.  Left Ear: Hearing, tympanic membrane, external ear and ear canal normal. Tympanic membrane is not erythematous, not retracted and not bulging.  Nose: No mucosal edema or rhinorrhea. Right sinus exhibits no maxillary sinus tenderness and no frontal sinus tenderness. Left sinus exhibits no maxillary sinus tenderness and no frontal sinus tenderness.  Mouth/Throat: Uvula is midline, oropharynx is clear and moist and mucous membranes are normal.  Eyes: Conjunctivae, EOM and lids are normal. Pupils are equal, round, and reactive to light. Lids are everted and swept, no foreign bodies found.  Neck: Trachea normal and normal range of motion. Neck supple. Carotid bruit is not present. No thyroid mass and no thyromegaly present.  Cardiovascular: Normal rate, regular rhythm, S1 normal, S2 normal, normal heart sounds, intact distal pulses and normal pulses. Exam reveals no gallop and no friction rub.  No murmur heard. Pulmonary/Chest: Effort normal. No tachypnea. No respiratory distress. She has decreased breath sounds. She has no wheezes. She has no rhonchi. She has no rales.  Unchanged from previous exam  Abdominal: Soft. Normal appearance and bowel sounds are normal. There is no tenderness.  Neurological: She is alert.  Skin: Skin is warm, dry and intact. No rash noted.  Psychiatric: Her speech is normal and behavior is normal. Judgment and thought content normal. Her mood appears not anxious. Cognition and memory are normal. She does not exhibit a depressed mood.   Diabetic foot exam: Normal inspection No skin breakdown No calluses  Normal DP pulses Normal sensation to light touch and monofilament Nails normal

## 2015-02-02 NOTE — Assessment & Plan Note (Signed)
Well controlled on no med. 

## 2015-02-06 ENCOUNTER — Other Ambulatory Visit: Payer: Self-pay | Admitting: *Deleted

## 2015-02-06 DIAGNOSIS — C3400 Malignant neoplasm of unspecified main bronchus: Secondary | ICD-10-CM

## 2015-02-06 MED ORDER — ALPRAZOLAM 0.25 MG PO TABS: 0.2500 mg | ORAL_TABLET | Freq: Every evening | ORAL | Status: DC | PRN

## 2015-02-07 ENCOUNTER — Other Ambulatory Visit: Payer: Self-pay | Admitting: Internal Medicine

## 2015-02-11 ENCOUNTER — Other Ambulatory Visit: Payer: Self-pay | Admitting: Internal Medicine

## 2015-02-13 ENCOUNTER — Other Ambulatory Visit: Payer: Self-pay | Admitting: Medical Oncology

## 2015-02-13 ENCOUNTER — Ambulatory Visit (INDEPENDENT_AMBULATORY_CARE_PROVIDER_SITE_OTHER): Payer: Medicare Other | Admitting: *Deleted

## 2015-02-13 ENCOUNTER — Other Ambulatory Visit: Payer: Self-pay | Admitting: *Deleted

## 2015-02-13 DIAGNOSIS — C3490 Malignant neoplasm of unspecified part of unspecified bronchus or lung: Secondary | ICD-10-CM

## 2015-02-13 DIAGNOSIS — I639 Cerebral infarction, unspecified: Secondary | ICD-10-CM

## 2015-02-13 DIAGNOSIS — C3410 Malignant neoplasm of upper lobe, unspecified bronchus or lung: Secondary | ICD-10-CM

## 2015-02-13 MED ORDER — MORPHINE SULFATE ER 60 MG PO TBCR: 60.0000 mg | EXTENDED_RELEASE_TABLET | Freq: Two times a day (BID) | ORAL | Status: DC

## 2015-02-13 NOTE — Telephone Encounter (Signed)
rx locked up. 

## 2015-02-13 NOTE — Telephone Encounter (Signed)
VM message from patient requesting refill for her MS Contin 60 mg.  Last filled on 01/10/15  Pt states she can pick up prescription tomorrow morning.

## 2015-02-14 NOTE — Progress Notes (Signed)
[facility] Summary Report / Loop Recorder 

## 2015-02-16 ENCOUNTER — Other Ambulatory Visit: Payer: Self-pay | Admitting: *Deleted

## 2015-02-16 DIAGNOSIS — C3411 Malignant neoplasm of upper lobe, right bronchus or lung: Secondary | ICD-10-CM

## 2015-02-21 ENCOUNTER — Other Ambulatory Visit (HOSPITAL_BASED_OUTPATIENT_CLINIC_OR_DEPARTMENT_OTHER): Payer: Medicare Other

## 2015-02-21 ENCOUNTER — Encounter: Payer: Self-pay | Admitting: Internal Medicine

## 2015-02-21 ENCOUNTER — Ambulatory Visit (HOSPITAL_BASED_OUTPATIENT_CLINIC_OR_DEPARTMENT_OTHER): Payer: Medicare Other | Admitting: Internal Medicine

## 2015-02-21 ENCOUNTER — Encounter: Payer: Self-pay | Admitting: Oncology

## 2015-02-21 ENCOUNTER — Telehealth: Payer: Self-pay | Admitting: Internal Medicine

## 2015-02-21 ENCOUNTER — Ambulatory Visit (HOSPITAL_BASED_OUTPATIENT_CLINIC_OR_DEPARTMENT_OTHER): Payer: Medicare Other

## 2015-02-21 VITALS — BP 152/62 | HR 86 | Temp 98.1°F | Resp 17 | Ht 63.0 in | Wt 147.8 lb

## 2015-02-21 DIAGNOSIS — Z5111 Encounter for antineoplastic chemotherapy: Secondary | ICD-10-CM

## 2015-02-21 DIAGNOSIS — C3411 Malignant neoplasm of upper lobe, right bronchus or lung: Secondary | ICD-10-CM

## 2015-02-21 DIAGNOSIS — C3410 Malignant neoplasm of upper lobe, unspecified bronchus or lung: Secondary | ICD-10-CM | POA: Diagnosis not present

## 2015-02-21 DIAGNOSIS — C3490 Malignant neoplasm of unspecified part of unspecified bronchus or lung: Secondary | ICD-10-CM

## 2015-02-21 DIAGNOSIS — C7989 Secondary malignant neoplasm of other specified sites: Secondary | ICD-10-CM

## 2015-02-21 DIAGNOSIS — C7931 Secondary malignant neoplasm of brain: Secondary | ICD-10-CM | POA: Diagnosis not present

## 2015-02-21 LAB — CBC WITH DIFFERENTIAL/PLATELET
BASO%: 0.2 % (ref 0.0–2.0)
BASOS ABS: 0 10*3/uL (ref 0.0–0.1)
EOS ABS: 0.1 10*3/uL (ref 0.0–0.5)
EOS%: 0.7 % (ref 0.0–7.0)
HCT: 36.4 % (ref 34.8–46.6)
HEMOGLOBIN: 11.6 g/dL (ref 11.6–15.9)
LYMPH%: 28.9 % (ref 14.0–49.7)
MCH: 28.8 pg (ref 25.1–34.0)
MCHC: 31.9 g/dL (ref 31.5–36.0)
MCV: 90.2 fL (ref 79.5–101.0)
MONO#: 1 10*3/uL — ABNORMAL HIGH (ref 0.1–0.9)
MONO%: 10.3 % (ref 0.0–14.0)
NEUT#: 5.6 10*3/uL (ref 1.5–6.5)
NEUT%: 59.9 % (ref 38.4–76.8)
Platelets: 513 10*3/uL — ABNORMAL HIGH (ref 145–400)
RBC: 4.04 10*6/uL (ref 3.70–5.45)
RDW: 15.3 % — AB (ref 11.2–14.5)
WBC: 9.3 10*3/uL (ref 3.9–10.3)
lymph#: 2.7 10*3/uL (ref 0.9–3.3)

## 2015-02-21 LAB — COMPREHENSIVE METABOLIC PANEL
ALBUMIN: 3.3 g/dL — AB (ref 3.5–5.0)
ALK PHOS: 83 U/L (ref 40–150)
ALT: 10 U/L (ref 0–55)
ANION GAP: 10 meq/L (ref 3–11)
AST: 18 U/L (ref 5–34)
BUN: 12.8 mg/dL (ref 7.0–26.0)
CALCIUM: 9.7 mg/dL (ref 8.4–10.4)
CO2: 26 mEq/L (ref 22–29)
Chloride: 106 mEq/L (ref 98–109)
Creatinine: 1 mg/dL (ref 0.6–1.1)
EGFR: 64 mL/min/{1.73_m2} — AB (ref >90)
Glucose: 115 mg/dl (ref 70–140)
POTASSIUM: 4.4 meq/L (ref 3.5–5.1)
SODIUM: 142 meq/L (ref 136–145)
Total Bilirubin: <0.3 mg/dL (ref 0.20–1.20)
Total Protein: 7.4 g/dL (ref 6.4–8.3)

## 2015-02-21 LAB — RESEARCH LABS

## 2015-02-21 MED ORDER — SODIUM CHLORIDE 0.9 % IJ SOLN
10.0000 mL | INTRAMUSCULAR | Status: DC | PRN
  Administered 2015-02-21: 10 mL
  Filled 2015-02-21: qty 10

## 2015-02-21 MED ORDER — HEPARIN SOD (PORK) LOCK FLUSH 100 UNIT/ML IV SOLN
500.0000 [IU] | Freq: Once | INTRAVENOUS | Status: AC | PRN
  Administered 2015-02-21: 500 [IU]
  Filled 2015-02-21: qty 5

## 2015-02-21 MED ORDER — SODIUM CHLORIDE 0.9 % IV SOLN
500.0000 mg/m2 | Freq: Once | INTRAVENOUS | Status: AC
  Administered 2015-02-21: 900 mg via INTRAVENOUS
  Filled 2015-02-21: qty 32

## 2015-02-21 MED ORDER — SODIUM CHLORIDE 0.9 % IV SOLN
Freq: Once | INTRAVENOUS | Status: AC
  Administered 2015-02-21: 13:00:00 via INTRAVENOUS

## 2015-02-21 MED ORDER — SODIUM CHLORIDE 0.9 % IV SOLN
Freq: Once | INTRAVENOUS | Status: AC
  Administered 2015-02-21: 13:00:00 via INTRAVENOUS
  Filled 2015-02-21: qty 4

## 2015-02-21 MED ORDER — MORPHINE SULFATE 30 MG PO TABS: 30.0000 mg | ORAL_TABLET | ORAL | Status: DC | PRN

## 2015-02-21 NOTE — Progress Notes (Signed)
[place] Telephone:([phone]) [phone]   [phone]) [phone]  OFFICE PROGRESS NOTE  [name], MD [place] [address]  Diagnosis:  Metastatic non-small cell lung cancer initially diagnosed with locally advanced stage IIIB with right Pancoast tumor involving the vertebral body as well as the foraminal canal invasion with spinal cord compression in October of 2007.   Prior Therapy:  1. Status post concurrent chemoradiation with weekly carboplatin and paclitaxel, last dose was given November 18, 2005. 2. 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 3. Status post gamma knife radiotherapy to a solitary brain lesion located in the superior frontal area of the brain at [facility] in April of 2008. 4. Status post palliative radiotherapy to the lateral abdominal wall metastatic lesion under the care of Dr. Lisbeth Renshaw, completed March of 2009. 5. Status post 6 cycles of systemic chemotherapy with carboplatin and Alimta. Last dose was given July 26, 2007 with disease stabilization. 6. Gamma knife stereotactic radiotherapy to 2 brain lesions one involving the right frontal dural based as well as right parietal lesion performed on 05/07/2012 under the care of Dr. Vallarie Mare at [facility].  Current Therapy:  Maintenance chemotherapy with Alimta 500 mg per meter squared given every 3 weeks. The patient is status post 129 cycles.   CHEMOTHERAPY INTENT: Palliative/maintenance  CURRENT # OF CHEMOTHERAPY CYCLES: 130 CURRENT ANTIEMETICS: Zofran, dexamethasone and Compazine  CURRENT SMOKING STATUS: Quit smoking 10/07/2005  ORAL CHEMOTHERAPY AND CONSENT: None  CURRENT BISPHOSPHONATES USE: None  PAIN MANAGEMENT: 0/10 currently on morphine  NARCOTICS INDUCED CONSTIPATION: No constipation  LIVING WILL AND CODE STATUS: No CODE BLUE  INTERVAL HISTORY: Danielle [name] 65 y.o. female  returns to the clinic today for followup visit. The patient is feeling fine today with no specific complaints except for mild low back pain. She is currently on pain medication with MS Contin and morphine sulfate. She is requesting refill of morphine sulfate. She is tolerating her maintenance treatment with single agent Alimta fairly well with no significant adverse effects. The patient denied having any chest pain, shortness of breath, or hemoptysis. No nausea or vomiting. The patient denied having any weight loss or night sweats. She is here today to start cycle #130 her maintenance chemotherapy.  MEDICAL HISTORY: Past Medical History  Diagnosis Date  . Hypertension   . Anemia   . H/O: pneumonia   . History of tobacco abuse quit 10/08    on nicotine patch  . Fibromyalgia   . Hypokalemia   . DJD (degenerative joint disease), cervical   . Thrush 12/11/2010  . CVA (cerebral vascular accident) ([place]) 04/2014    pt states had 2 cva's within 2 wks  . Lung cancer ([place]) dx'd 09/2005    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 had chemo and radiation for lung ca    ALLERGIES:  is allergic to contrast media; iohexol; sulfa drugs cross reactors; and co-trimoxazole injection.  MEDICATIONS:  Current Outpatient Prescriptions  Medication Sig Dispense Refill  . ALPRAZolam (XANAX) 0.25 MG tablet Take 1 tablet (0.25 mg total) by mouth at bedtime as needed. for sleep 30 tablet 0  . amLODipine (NORVASC) 10 MG tablet TAKE 1 TABLET (10 MG TOTAL) BY MOUTH DAILY. 90 tablet 1  . aspirin EC 325 MG tablet TAKE 1 TABLET (325 MG TOTAL) BY MOUTH DAILY. 30 tablet 0  . atorvastatin (LIPITOR) 40 MG tablet Take 1  tablet (40 mg total) by mouth daily at 6 PM. 90 tablet 3  . fluticasone (FLONASE) 50 MCG/ACT nasal spray USE 1-2 SPRAYS IN EACH NOSTRIL DAILY (Patient taking differently: USE 1-2 SPRAYS IN EACH NOSTRIL DAILY PRN CONGESTION) 16 g 11  . folic acid (FOLVITE) 1 MG tablet TAKE 1 TABLET BY MOUTH DAILY. 90 tablet 1    . furosemide (LASIX) 20 MG tablet TAKE 1 TABLET (20 MG TOTAL) BY MOUTH DAILY AS NEEDED FOR EDEMA. 10 tablet 0  . lidocaine-prilocaine (EMLA) cream Apply to Port-A-Cath as directed 30 g 1  . lisinopril (PRINIVIL,ZESTRIL) 20 MG tablet TAKE 2 TABLETS (40 MG TOTAL) BY MOUTH DAILY. 60 tablet 5  . morphine (MS CONTIN) 60 MG 12 hr tablet Take 1 tablet (60 mg total) by mouth 2 (two) times daily. 60 tablet 0  . morphine (MSIR) 30 MG tablet Take 1 tablet (30 mg total) by mouth every 4 (four) hours as needed (breakthrough pain). 60 tablet 0  . Olopatadine HCl 0.2 % SOLN Apply 1 drop to eye daily. 2.5 mL 11  . ondansetron (ZOFRAN) 8 MG tablet TAKE 1 TABLET (8 MG TOTAL) BY MOUTH 2 (TWO) TIMES DAILY AS NEEDED FOR NAUSEA OR VOMITING. 30 tablet 2  . predniSONE (DELTASONE) 50 MG tablet Take 1 tablet (50 mg total) by mouth See admin instructions. Take 1 tablet (50 mg) 13 hours before CT scan, 7 hours before CT scan and 1 hour before CT scan (every 9 weeks) 3 tablet 1   No current facility-administered medications for this visit.    SURGICAL HISTORY:  Past Surgical History  Procedure Laterality Date  . Cervical laminectomy  1995  . Other surgical history  2008    Gamma knife surgery to remove brain met   . 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 Knee surgery  1990    Left x 2  . Portacath placement  -[name]    TIP IN CAVOATRIAL JUNCTION  . Loop recorder implant N/A 04/19/2014    Procedure: LOOP RECORDER IMPLANT;  Surgeon: Thompson Grayer, MD;  Location: [facility] CATH LAB;  Service: Cardiovascular;  Laterality: N/A;  . Tee without cardioversion N/A 04/19/2014    Procedure: TRANSESOPHAGEAL ECHOCARDIOGRAM (TEE);  Surgeon: Larey Dresser, MD;  Location: [facility];  Service: Cardiovascular;  Laterality: N/A;    REVIEW OF SYSTEMS:  A comprehensive review of systems was negative except for: Musculoskeletal: positive for back pain   PHYSICAL EXAMINATION: General appearance: alert,  cooperative and no distress Head: Normocephalic, without obvious abnormality, atraumatic Neck: no adenopathy, no JVD, supple, symmetrical, trachea midline and thyroid not enlarged, symmetric, no tenderness/mass/nodules Lymph nodes: Cervical, supraclavicular, and axillary nodes normal. Resp: clear to auscultation bilaterally Back: symmetric, no curvature. ROM normal. No CVA tenderness. Cardio: regular rate and rhythm, S1, S2 normal, no murmur, click, rub or gallop GI: soft, non-tender; bowel sounds normal; no masses,  no organomegaly Extremities: extremities normal, atraumatic, no cyanosis or edema Neurologic: Alert and oriented X 3, normal strength and tone. Normal symmetric reflexes. Normal coordination and gait  ECOG PERFORMANCE STATUS: 0 - Asymptomatic  Blood pressure 152/62, pulse 86, temperature 98.1 F (36.7 C), temperature source Oral, resp. rate 17, height '5\' 3"'  (1.6 m), weight 147 lb 12.8 oz (67.042 kg), SpO2 98 %.  LABORATORY DATA: Lab Results  Component Value Date   WBC 9.3 02/21/2015   HGB 11.6 02/21/2015   HCT 36.4 02/21/2015   MCV 90.2 02/21/2015  PLT 513* 02/21/2015      Chemistry      Component Value Date/Time   NA 142 01/31/2015 1021   NA 139 01/26/2015 0818   K 4.2 01/31/2015 1021   K 4.1 01/26/2015 0818   CL 103 01/26/2015 0818   CL 106 06/30/2012 1004   CO2 28 01/31/2015 1021   CO2 28 01/26/2015 0818   BUN 10.5 01/31/2015 1021   BUN 13 01/26/2015 0818   CREATININE 0.9 01/31/2015 1021   CREATININE 0.93 01/26/2015 0818      Component Value Date/Time   CALCIUM 9.7 01/31/2015 1021   CALCIUM 9.0 01/26/2015 0818   ALKPHOS 89 01/31/2015 1021   ALKPHOS 79 01/26/2015 0818   AST 18 01/31/2015 1021   AST 18 01/26/2015 0818   ALT 10 01/31/2015 1021   ALT 15 01/26/2015 0818   BILITOT 0.45 01/31/2015 1021   BILITOT 0.4 01/26/2015 0818       RADIOGRAPHIC STUDIES: No results found. ASSESSMENT AND PLAN: This is a very pleasant 65 years old white female  with metastatic non-small cell lung cancer currently undergoing maintenance chemotherapy with single agent Alimta status post 129 cycles. The patient is tolerating her treatment fairly well with no significant adverse effects.  I recommended for her to continue with her maintenance chemotherapy and the patient will receive cycle #130 today. For pain management, she will continue on her current pain medication with MS Contin and MS IR. She was given a refill of the morphine sulfate today. The patient would come back for follow-up visit in 3 weeks for reevaluation before starting cycle #131 of her maintenance therapy. She was advised to call immediately if she has any concerning symptoms in the interval. The patient voices understanding of current disease status and treatment options and is in agreement with the current care plan.  All questions were answered. The patient knows to call the clinic with any problems, questions or concerns. We can certainly see the patient much sooner if necessary.  Disclaimer: This note was dictated with voice recognition software. Similar sounding words can inadvertently be transcribed and may not be corrected upon review.

## 2015-02-21 NOTE — Progress Notes (Signed)
02/21/15 - BMS [id number] - Questionnaires (PRO's) - Patient into cancer center for routine visit.  Patient given PRO's upon arrival to the cancer center. Patient completed PRO's (EQ-5D-3L first and then the LCSS booklet) before any study procedures were done.  The PRO's were checked for completeness.  The patient then had her research labs drawn.  The patient was thanked for her continued support in this clinical trial. Remer Macho 02/21/15 - 10:30 am

## 2015-02-21 NOTE — Patient Instructions (Signed)
[facility] Discharge Instructions for Patients Receiving Chemotherapy  Today you received the following chemotherapy agents; Alimta.   To help prevent nausea and vomiting after your treatment, we encourage you to take your nausea medication as directed.    If you develop nausea and vomiting that is not controlled by your nausea medication, call the clinic.   BELOW ARE SYMPTOMS THAT SHOULD BE REPORTED IMMEDIATELY:  *FEVER GREATER THAN 100.5 F  *CHILLS WITH OR WITHOUT FEVER  NAUSEA AND VOMITING THAT IS NOT CONTROLLED WITH YOUR NAUSEA MEDICATION  *UNUSUAL SHORTNESS OF BREATH  *UNUSUAL BRUISING OR BLEEDING  TENDERNESS IN MOUTH AND THROAT WITH OR WITHOUT PRESENCE OF ULCERS  *URINARY PROBLEMS  *BOWEL PROBLEMS  UNUSUAL RASH Items with * indicate a potential emergency and should be followed up as soon as possible.  Feel free to call the clinic you have any questions or concerns. The clinic phone number is [phone].  Please show the CHEMO ALERT CARD at check-in to the Emergency Department and triage nurse.   

## 2015-02-21 NOTE — Telephone Encounter (Signed)
pt sch already made out gave copy

## 2015-02-27 LAB — CUP PACEART REMOTE DEVICE CHECK: Date Time Interrogation Session: 20170107200626

## 2015-02-27 NOTE — Progress Notes (Signed)
[facility] summary report received. Battery status OK. Normal device function. No new symptom episodes, tachy episodes, brady, or pause episodes. No new AF episodes. Monthly summary reports and ROV/PRN 

## 2015-03-06 ENCOUNTER — Other Ambulatory Visit: Payer: Self-pay

## 2015-03-06 DIAGNOSIS — C3400 Malignant neoplasm of unspecified main bronchus: Secondary | ICD-10-CM

## 2015-03-06 MED ORDER — ALPRAZOLAM 0.25 MG PO TABS: 0.2500 mg | ORAL_TABLET | Freq: Every evening | ORAL | Status: DC | PRN

## 2015-03-06 NOTE — Addendum Note (Signed)
Addended by: Ardeen Garland on: 03/06/2015 04:16 PM   Modules accepted: Orders

## 2015-03-06 NOTE — Telephone Encounter (Signed)
Patient called requesting a refill on xanax, she would like it called into the [facility] pharmacy.

## 2015-03-09 ENCOUNTER — Other Ambulatory Visit: Payer: Self-pay | Admitting: Internal Medicine

## 2015-03-14 ENCOUNTER — Ambulatory Visit (HOSPITAL_BASED_OUTPATIENT_CLINIC_OR_DEPARTMENT_OTHER): Payer: Medicare Other

## 2015-03-14 ENCOUNTER — Encounter: Payer: Self-pay | Admitting: Internal Medicine

## 2015-03-14 ENCOUNTER — Other Ambulatory Visit (HOSPITAL_BASED_OUTPATIENT_CLINIC_OR_DEPARTMENT_OTHER): Payer: Medicare Other

## 2015-03-14 ENCOUNTER — Ambulatory Visit (HOSPITAL_BASED_OUTPATIENT_CLINIC_OR_DEPARTMENT_OTHER): Payer: Medicare Other | Admitting: Internal Medicine

## 2015-03-14 VITALS — BP 125/57 | HR 92 | Temp 98.2°F | Resp 17 | Ht 63.0 in | Wt 148.1 lb

## 2015-03-14 DIAGNOSIS — C3410 Malignant neoplasm of upper lobe, unspecified bronchus or lung: Secondary | ICD-10-CM

## 2015-03-14 DIAGNOSIS — Z5111 Encounter for antineoplastic chemotherapy: Secondary | ICD-10-CM

## 2015-03-14 DIAGNOSIS — C3411 Malignant neoplasm of upper lobe, right bronchus or lung: Secondary | ICD-10-CM

## 2015-03-14 DIAGNOSIS — C3490 Malignant neoplasm of unspecified part of unspecified bronchus or lung: Secondary | ICD-10-CM

## 2015-03-14 DIAGNOSIS — C3491 Malignant neoplasm of unspecified part of right bronchus or lung: Secondary | ICD-10-CM

## 2015-03-14 DIAGNOSIS — C7931 Secondary malignant neoplasm of brain: Secondary | ICD-10-CM

## 2015-03-14 LAB — COMPREHENSIVE METABOLIC PANEL
ALT: 11 U/L (ref 0–55)
AST: 18 U/L (ref 5–34)
Albumin: 3.4 g/dL — ABNORMAL LOW (ref 3.5–5.0)
Alkaline Phosphatase: 85 U/L (ref 40–150)
Anion Gap: 9 mEq/L (ref 3–11)
BUN: 17.1 mg/dL (ref 7.0–26.0)
CHLORIDE: 107 meq/L (ref 98–109)
CO2: 26 meq/L (ref 22–29)
CREATININE: 0.8 mg/dL (ref 0.6–1.1)
Calcium: 9.7 mg/dL (ref 8.4–10.4)
EGFR: 77 mL/min/{1.73_m2} — ABNORMAL LOW (ref >90)
GLUCOSE: 153 mg/dL — AB (ref 70–140)
POTASSIUM: 4.4 meq/L (ref 3.5–5.1)
SODIUM: 141 meq/L (ref 136–145)
Total Bilirubin: 0.31 mg/dL (ref 0.20–1.20)
Total Protein: 7.1 g/dL (ref 6.4–8.3)

## 2015-03-14 LAB — CBC WITH DIFFERENTIAL/PLATELET
BASO%: 0.3 % (ref 0.0–2.0)
BASOS ABS: 0 10*3/uL (ref 0.0–0.1)
EOS%: 0.9 % (ref 0.0–7.0)
Eosinophils Absolute: 0.1 10*3/uL (ref 0.0–0.5)
HEMATOCRIT: 35.7 % (ref 34.8–46.6)
HGB: 11.6 g/dL (ref 11.6–15.9)
LYMPH#: 1.8 10*3/uL (ref 0.9–3.3)
LYMPH%: 19.7 % (ref 14.0–49.7)
MCH: 29.4 pg (ref 25.1–34.0)
MCHC: 32.4 g/dL (ref 31.5–36.0)
MCV: 90.7 fL (ref 79.5–101.0)
MONO#: 1.1 10*3/uL — ABNORMAL HIGH (ref 0.1–0.9)
MONO%: 11.6 % (ref 0.0–14.0)
NEUT#: 6.3 10*3/uL (ref 1.5–6.5)
NEUT%: 67.5 % (ref 38.4–76.8)
Platelets: 384 10*3/uL (ref 145–400)
RBC: 3.94 10*6/uL (ref 3.70–5.45)
RDW: 15.9 % — ABNORMAL HIGH (ref 11.2–14.5)
WBC: 9.3 10*3/uL (ref 3.9–10.3)

## 2015-03-14 LAB — CUP PACEART REMOTE DEVICE CHECK: MDC IDC SESS DTM: 20170206201105

## 2015-03-14 MED ORDER — SODIUM CHLORIDE 0.9 % IV SOLN
500.0000 mg/m2 | Freq: Once | INTRAVENOUS | Status: AC
  Administered 2015-03-14: 900 mg via INTRAVENOUS
  Filled 2015-03-14: qty 32

## 2015-03-14 MED ORDER — SODIUM CHLORIDE 0.9 % IJ SOLN
10.0000 mL | INTRAMUSCULAR | Status: DC | PRN
  Administered 2015-03-14: 10 mL
  Filled 2015-03-14: qty 10

## 2015-03-14 MED ORDER — HEPARIN SOD (PORK) LOCK FLUSH 100 UNIT/ML IV SOLN
500.0000 [IU] | Freq: Once | INTRAVENOUS | Status: AC | PRN
  Administered 2015-03-14: 500 [IU]
  Filled 2015-03-14: qty 5

## 2015-03-14 MED ORDER — SODIUM CHLORIDE 0.9 % IV SOLN
Freq: Once | INTRAVENOUS | Status: AC
  Administered 2015-03-14: 11:00:00 via INTRAVENOUS

## 2015-03-14 MED ORDER — MORPHINE SULFATE ER 60 MG PO TBCR: 60.0000 mg | EXTENDED_RELEASE_TABLET | Freq: Two times a day (BID) | ORAL | Status: DC

## 2015-03-14 MED ORDER — SODIUM CHLORIDE 0.9 % IV SOLN
Freq: Once | INTRAVENOUS | Status: AC
  Administered 2015-03-14: 11:00:00 via INTRAVENOUS
  Filled 2015-03-14: qty 4

## 2015-03-14 NOTE — Progress Notes (Signed)
[facility] summary report received. Battery status OK. Normal device function. No new symptom episodes, tachy episodes, brady, or pause episodes. No new AF episodes. Monthly summary reports and ROV/PRN 

## 2015-03-14 NOTE — Progress Notes (Signed)
[place] [phone]   Fax:(336) [phone]  OFFICE PROGRESS NOTE  [name], MD [place] [address]  Diagnosis:  Metastatic non-small cell lung cancer initially diagnosed with locally advanced stage IIIB with right Pancoast tumor involving the vertebral body as well as the foraminal canal invasion with spinal cord compression in October of 2007.   Prior Therapy:  1. Status post concurrent chemoradiation with weekly carboplatin and paclitaxel, last dose was given November 18, 2005. 2. 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 3. Status post gamma knife radiotherapy to a solitary brain lesion located in the superior frontal area of the brain at [facility] in April of 2008. 4. Status post palliative radiotherapy to the lateral abdominal wall metastatic lesion under the care of Dr. Lisbeth Renshaw, completed March of 2009. 5. Status post 6 cycles of systemic chemotherapy with carboplatin and Alimta. Last dose was given July 26, 2007 with disease stabilization. 6. Gamma knife stereotactic radiotherapy to 2 brain lesions one involving the right frontal dural based as well as right parietal lesion performed on 05/07/2012 under the care of Dr. Vallarie Mare at [facility].  Current Therapy:  Maintenance chemotherapy with Alimta 500 mg per meter squared given every 3 weeks. The patient is status post 130 cycles.   CHEMOTHERAPY INTENT: Palliative/maintenance  CURRENT # OF CHEMOTHERAPY CYCLES: 131 CURRENT ANTIEMETICS: Zofran, dexamethasone and Compazine  CURRENT SMOKING STATUS: Quit smoking 10/07/2005  ORAL CHEMOTHERAPY AND CONSENT: None  CURRENT BISPHOSPHONATES USE: None  PAIN MANAGEMENT: 0/10 currently on morphine  NARCOTICS INDUCED CONSTIPATION: No constipation  LIVING WILL AND CODE STATUS: No CODE BLUE  INTERVAL HISTORY: Danielle [name] 65 y.o. female  returns to the clinic today for followup visit accompanied by her sister. The patient is feeling fine today with no specific complaints. She is requesting refill of MS Contin. She is tolerating her maintenance treatment with single agent Alimta fairly well with no significant adverse effects. The patient denied having any chest pain, shortness of breath, or hemoptysis. No nausea or vomiting. The patient denied having any weight loss or night sweats. She is here today to start cycle #131 her maintenance chemotherapy.  MEDICAL HISTORY: Past Medical History  Diagnosis Date  . Hypertension   . Anemia   . H/O: pneumonia   . History of tobacco abuse quit 10/08    on nicotine patch  . Fibromyalgia   . Hypokalemia   . DJD (degenerative joint disease), cervical   . Thrush 12/11/2010  . CVA (cerebral vascular accident) ([place]) 04/2014    pt states had 2 cva's within 2 wks  . Lung cancer ([place]) dx'd 09/2005    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 had chemo and radiation for lung ca    ALLERGIES:  is allergic to contrast media; iohexol; sulfa drugs cross reactors; and co-trimoxazole injection.  MEDICATIONS:  Current Outpatient Prescriptions  Medication Sig Dispense Refill  . ALPRAZolam (XANAX) 0.25 MG tablet Take 1 tablet (0.25 mg total) by mouth at bedtime as needed. for sleep 30 tablet 0  . amLODipine (NORVASC) 10 MG tablet TAKE 1 TABLET (10 MG TOTAL) BY MOUTH DAILY. 90 tablet 1  . aspirin EC 325 MG tablet TAKE 1 TABLET (325 MG TOTAL) BY MOUTH DAILY. 30 tablet 0  . atorvastatin (LIPITOR) 40 MG tablet Take 1 tablet (40 mg total) by mouth daily at 6 PM. 90 tablet 3  .  fluticasone (FLONASE) 50 MCG/ACT nasal spray USE 1-2 SPRAYS IN EACH NOSTRIL DAILY (Patient taking differently: USE 1-2 SPRAYS IN EACH NOSTRIL DAILY PRN CONGESTION) 16 g 11  . folic acid (FOLVITE) 1 MG tablet TAKE 1 TABLET BY MOUTH DAILY. 90 tablet 1  . furosemide (LASIX) 20 MG tablet TAKE 1 TABLET (20 MG TOTAL) BY MOUTH DAILY AS  NEEDED FOR EDEMA. 10 tablet 0  . lidocaine-prilocaine (EMLA) cream Apply to Port-A-Cath as directed 30 g 1  . lisinopril (PRINIVIL,ZESTRIL) 20 MG tablet TAKE 2 TABLETS (40 MG TOTAL) BY MOUTH DAILY. 60 tablet 5  . morphine (MS CONTIN) 60 MG 12 hr tablet Take 1 tablet (60 mg total) by mouth 2 (two) times daily. 60 tablet 0  . morphine (MSIR) 30 MG tablet Take 1 tablet (30 mg total) by mouth every 4 (four) hours as needed (breakthrough pain). 60 tablet 0  . Olopatadine HCl 0.2 % SOLN Apply 1 drop to eye daily. 2.5 mL 11  . ondansetron (ZOFRAN) 8 MG tablet TAKE 1 TABLET (8 MG TOTAL) BY MOUTH 2 (TWO) TIMES DAILY AS NEEDED FOR NAUSEA OR VOMITING. 30 tablet 2  . predniSONE (DELTASONE) 50 MG tablet Take 1 tablet (50 mg total) by mouth See admin instructions. Take 1 tablet (50 mg) 13 hours before CT scan, 7 hours before CT scan and 1 hour before CT scan (every 9 weeks) 3 tablet 1   No current facility-administered medications for this visit.    SURGICAL HISTORY:  Past Surgical History  Procedure Laterality Date  . Cervical laminectomy  1995  . Other surgical history  2008    Gamma knife surgery to remove brain met   . 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 Knee surgery  1990    Left x 2  . Portacath placement  -[name]    TIP IN CAVOATRIAL JUNCTION  . Loop recorder implant N/A 04/19/2014    Procedure: LOOP RECORDER IMPLANT;  Surgeon: Thompson Grayer, MD;  Location: [facility] CATH LAB;  Service: Cardiovascular;  Laterality: N/A;  . Tee without cardioversion N/A 04/19/2014    Procedure: TRANSESOPHAGEAL ECHOCARDIOGRAM (TEE);  Surgeon: Larey Dresser, MD;  Location: [facility] ENDOSCOPY;  Service: Cardiovascular;  Laterality: N/A;    REVIEW OF SYSTEMS:  A comprehensive review of systems was negative.   PHYSICAL EXAMINATION: General appearance: alert, cooperative and no distress Head: Normocephalic, without obvious abnormality, atraumatic Neck: no adenopathy, no JVD, supple,  symmetrical, trachea midline and thyroid not enlarged, symmetric, no tenderness/mass/nodules Lymph nodes: Cervical, supraclavicular, and axillary nodes normal. Resp: clear to auscultation bilaterally Back: symmetric, no curvature. ROM normal. No CVA tenderness. Cardio: regular rate and rhythm, S1, S2 normal, no murmur, click, rub or gallop GI: soft, non-tender; bowel sounds normal; no masses,  no organomegaly Extremities: extremities normal, atraumatic, no cyanosis or edema Neurologic: Alert and oriented X 3, normal strength and tone. Normal symmetric reflexes. Normal coordination and gait  ECOG PERFORMANCE STATUS: 0 - Asymptomatic  Blood pressure 125/57, pulse 92, temperature 98.2 F (36.8 C), temperature source Oral, resp. rate 17, height _0  (1.6 m), weight 148 lb 1.6 oz (67.178 kg), SpO2 99 %.  LABORATORY DATA: Lab Results  Component Value Date   WBC 9.3 03/14/2015   HGB 11.6 03/14/2015   HCT 35.7 03/14/2015   MCV 90.7 03/14/2015   PLT 384 03/14/2015      Chemistry      Component Value Date/Time   NA 142  02/21/2015 1028   NA 139 01/26/2015 0818   K 4.4 02/21/2015 1028   K 4.1 01/26/2015 0818   CL 103 01/26/2015 0818   CL 106 06/30/2012 1004   CO2 26 02/21/2015 1028   CO2 28 01/26/2015 0818   BUN 12.8 02/21/2015 1028   BUN 13 01/26/2015 0818   CREATININE 1.0 02/21/2015 1028   CREATININE 0.93 01/26/2015 0818      Component Value Date/Time   CALCIUM 9.7 02/21/2015 1028   CALCIUM 9.0 01/26/2015 0818   ALKPHOS 83 02/21/2015 1028   ALKPHOS 79 01/26/2015 0818   AST 18 02/21/2015 1028   AST 18 01/26/2015 0818   ALT 10 02/21/2015 1028   ALT 15 01/26/2015 0818   BILITOT <0.30 02/21/2015 1028   BILITOT 0.4 01/26/2015 0818       RADIOGRAPHIC STUDIES: No results found. ASSESSMENT AND PLAN: This is a very pleasant 65 years old white female with metastatic non-small cell lung cancer currently undergoing maintenance chemotherapy with single agent Alimta status post 130  cycles. The patient is tolerating her treatment fairly well with no significant adverse effects.  I recommended for her to continue with her maintenance chemotherapy and the patient will receive cycle #131 today. For pain management, she will continue on her current pain medication with MS Contin and MS IR. She was given a refill of MS Contin today. The patient would come back for follow-up visit in 3 weeks for reevaluation before starting cycle #131 of her maintenance therapy. She was advised to call immediately if she has any concerning symptoms in the interval. The patient voices understanding of current disease status and treatment options and is in agreement with the current care plan.  All questions were answered. The patient knows to call the clinic with any problems, questions or concerns. We can certainly see the patient much sooner if necessary.  Disclaimer: This note was dictated with voice recognition software. Similar sounding words can inadvertently be transcribed and may not be corrected upon review.

## 2015-03-14 NOTE — Patient Instructions (Signed)
Pemetrexed injection What is this medicine? PEMETREXED (PEM e TREX ed) is a chemotherapy drug. This medicine affects cells that are rapidly growing, such as cancer cells and cells in your mouth and stomach. It is usually used to treat lung cancers like non-small cell lung cancer and mesothelioma. It may also be used to treat other cancers. This medicine may be used for other purposes; ask your health care provider or pharmacist if you have questions. What should I tell my health care provider before I take this medicine? They need to know if you have any of these conditions: -if you frequently drink alcohol containing beverages -infection (especially a virus infection such as chickenpox, cold sores, or herpes) -kidney disease -liver disease -low blood counts, like low platelets, red bloods, or white blood cells -an unusual or allergic reaction to pemetrexed, mannitol, other medicines, foods, dyes, or preservatives -pregnant or trying to get pregnant -breast-feeding How should I use this medicine? This drug is given as an infusion into a vein. It is administered in a hospital or clinic by a specially trained health care professional. Talk to your pediatrician regarding the use of this medicine in children. Special care may be needed. Overdosage: If you think you have taken too much of this medicine contact a poison control center or emergency room at once. NOTE: This medicine is only for you. Do not share this medicine with others. What if I miss a dose? It is important not to miss your dose. Call your doctor or health care professional if you are unable to keep an appointment. What may interact with this medicine? -aspirin and aspirin-like medicines -medicines to increase blood counts like filgrastim, pegfilgrastim, sargramostim -methotrexate -NSAIDS, medicines for pain and inflammation, like ibuprofen or naproxen -probenecid -pyrimethamine -vaccines Talk to your doctor or health care  professional before taking any of these medicines: -acetaminophen -aspirin -ibuprofen -ketoprofen -naproxen This list may not describe all possible interactions. Give your health care provider a list of all the medicines, herbs, non-prescription drugs, or dietary supplements you use. Also tell them if you smoke, drink alcohol, or use illegal drugs. Some items may interact with your medicine. What should I watch for while using this medicine? Visit your doctor for checks on your progress. This drug may make you feel generally unwell. This is not uncommon, as chemotherapy can affect healthy cells as well as cancer cells. Report any side effects. Continue your course of treatment even though you feel ill unless your doctor tells you to stop. In some cases, you may be given additional medicines to help with side effects. Follow all directions for their use. Call your doctor or health care professional for advice if you get a fever, chills or sore throat, or other symptoms of a cold or flu. Do not treat yourself. This drug decreases your body's ability to fight infections. Try to avoid being around people who are sick. This medicine may increase your risk to bruise or bleed. Call your doctor or health care professional if you notice any unusual bleeding. Be careful brushing and flossing your teeth or using a toothpick because you may get an infection or bleed more easily. If you have any dental work done, tell your dentist you are receiving this medicine. Avoid taking products that contain aspirin, acetaminophen, ibuprofen, naproxen, or ketoprofen unless instructed by your doctor. These medicines may hide a fever. Call your doctor or health care professional if you get diarrhea or mouth sores. Do not treat yourself. To protect your  kidneys, drink water or other fluids as directed while you are taking this medicine. Men and women must use effective birth control while taking this medicine. You may also  need to continue using effective birth control for a time after stopping this medicine. Do not become pregnant while taking this medicine. Tell your doctor right away if you think that you or your partner might be pregnant. There is a potential for serious side effects to an unborn child. Talk to your health care professional or pharmacist for more information. Do not breast-feed an infant while taking this medicine. This medicine may lower sperm counts. What side effects may I notice from receiving this medicine? Side effects that you should report to your doctor or health care professional as soon as possible: -allergic reactions like skin rash, itching or hives, swelling of the face, lips, or tongue -low blood counts - this medicine may decrease the number of white blood cells, red blood cells and platelets. You may be at increased risk for infections and bleeding. -signs of infection - fever or chills, cough, sore throat, pain or difficulty passing urine -signs of decreased platelets or bleeding - bruising, pinpoint red spots on the skin, black, tarry stools, blood in the urine -signs of decreased red blood cells - unusually weak or tired, fainting spells, lightheadedness -breathing problems, like a dry cough -changes in emotions or moods -chest pain -confusion -diarrhea -high blood pressure -mouth or throat sores or ulcers -pain, swelling, warmth in the leg -pain on swallowing -swelling of the ankles, feet, hands -trouble passing urine or change in the amount of urine -vomiting -yellowing of the eyes or skin Side effects that usually do not require medical attention (report to your doctor or health care professional if they continue or are bothersome): -hair loss -loss of appetite -nausea -stomach upset This list may not describe all possible side effects. Call your doctor for medical advice about side effects. You may report side effects to FDA at 1-800-FDA-1088. Where should I keep  my medicine? This drug is given in a hospital or clinic and will not be stored at home. NOTE: This sheet is a summary. It may not cover all possible information. If you have questions about this medicine, talk to your doctor, pharmacist, or health care provider.    2016, [employer]. (2007-07-28 13:24:03)

## 2015-03-15 ENCOUNTER — Ambulatory Visit (INDEPENDENT_AMBULATORY_CARE_PROVIDER_SITE_OTHER): Payer: Medicare Other | Admitting: *Deleted

## 2015-03-15 DIAGNOSIS — I639 Cerebral infarction, unspecified: Secondary | ICD-10-CM

## 2015-03-17 NOTE — Progress Notes (Signed)
[facility] Summary Report / Loop Recorder 

## 2015-03-18 LAB — CUP PACEART REMOTE DEVICE CHECK: Date Time Interrogation Session: 20170308203520

## 2015-03-18 NOTE — Progress Notes (Signed)
[facility] summary report received. Battery status OK. Normal device function. No new symptom episodes, tachy episodes, brady, or pause episodes. No new AF episodes. Monthly summary reports and ROV/PRN 

## 2015-03-22 ENCOUNTER — Other Ambulatory Visit: Payer: Self-pay | Admitting: Internal Medicine

## 2015-03-28 DIAGNOSIS — C3490 Malignant neoplasm of unspecified part of unspecified bronchus or lung: Secondary | ICD-10-CM | POA: Diagnosis not present

## 2015-03-28 DIAGNOSIS — C7931 Secondary malignant neoplasm of brain: Secondary | ICD-10-CM | POA: Diagnosis not present

## 2015-03-30 ENCOUNTER — Other Ambulatory Visit: Payer: Self-pay | Admitting: *Deleted

## 2015-03-30 DIAGNOSIS — C3411 Malignant neoplasm of upper lobe, right bronchus or lung: Secondary | ICD-10-CM

## 2015-04-04 ENCOUNTER — Encounter: Payer: Self-pay | Admitting: Oncology

## 2015-04-04 ENCOUNTER — Encounter: Payer: Self-pay | Admitting: Internal Medicine

## 2015-04-04 ENCOUNTER — Ambulatory Visit (HOSPITAL_BASED_OUTPATIENT_CLINIC_OR_DEPARTMENT_OTHER): Payer: Medicare Other | Admitting: Internal Medicine

## 2015-04-04 ENCOUNTER — Other Ambulatory Visit: Payer: Self-pay | Admitting: Medical Oncology

## 2015-04-04 ENCOUNTER — Other Ambulatory Visit (HOSPITAL_BASED_OUTPATIENT_CLINIC_OR_DEPARTMENT_OTHER): Payer: Medicare Other

## 2015-04-04 ENCOUNTER — Ambulatory Visit (HOSPITAL_BASED_OUTPATIENT_CLINIC_OR_DEPARTMENT_OTHER): Payer: Medicare Other

## 2015-04-04 ENCOUNTER — Telehealth: Payer: Self-pay | Admitting: Internal Medicine

## 2015-04-04 VITALS — BP 139/58 | HR 86 | Temp 97.7°F | Resp 18 | Ht 63.0 in | Wt 149.9 lb

## 2015-04-04 DIAGNOSIS — Z5111 Encounter for antineoplastic chemotherapy: Secondary | ICD-10-CM

## 2015-04-04 DIAGNOSIS — C3411 Malignant neoplasm of upper lobe, right bronchus or lung: Secondary | ICD-10-CM

## 2015-04-04 DIAGNOSIS — C3490 Malignant neoplasm of unspecified part of unspecified bronchus or lung: Secondary | ICD-10-CM

## 2015-04-04 DIAGNOSIS — C7931 Secondary malignant neoplasm of brain: Secondary | ICD-10-CM | POA: Diagnosis not present

## 2015-04-04 DIAGNOSIS — C3410 Malignant neoplasm of upper lobe, unspecified bronchus or lung: Secondary | ICD-10-CM

## 2015-04-04 DIAGNOSIS — C3400 Malignant neoplasm of unspecified main bronchus: Secondary | ICD-10-CM

## 2015-04-04 LAB — CBC WITH DIFFERENTIAL/PLATELET
BASO%: 0.2 % (ref 0.0–2.0)
BASOS ABS: 0 10*3/uL (ref 0.0–0.1)
EOS ABS: 0.1 10*3/uL (ref 0.0–0.5)
EOS%: 0.8 % (ref 0.0–7.0)
HEMATOCRIT: 36.7 % (ref 34.8–46.6)
HEMOGLOBIN: 11.7 g/dL (ref 11.6–15.9)
LYMPH%: 24.2 % (ref 14.0–49.7)
MCH: 29.5 pg (ref 25.1–34.0)
MCHC: 31.9 g/dL (ref 31.5–36.0)
MCV: 92.7 fL (ref 79.5–101.0)
MONO#: 0.9 10*3/uL (ref 0.1–0.9)
MONO%: 10.2 % (ref 0.0–14.0)
NEUT#: 5.5 10*3/uL (ref 1.5–6.5)
NEUT%: 64.6 % (ref 38.4–76.8)
Platelets: 324 10*3/uL (ref 145–400)
RBC: 3.96 10*6/uL (ref 3.70–5.45)
RDW: 15.1 % — AB (ref 11.2–14.5)
WBC: 8.6 10*3/uL (ref 3.9–10.3)
lymph#: 2.1 10*3/uL (ref 0.9–3.3)

## 2015-04-04 LAB — COMPREHENSIVE METABOLIC PANEL
ALT: 14 U/L (ref 0–55)
AST: 21 U/L (ref 5–34)
Albumin: 3.3 g/dL — ABNORMAL LOW (ref 3.5–5.0)
Alkaline Phosphatase: 89 U/L (ref 40–150)
Anion Gap: 10 mEq/L (ref 3–11)
BILIRUBIN TOTAL: 0.39 mg/dL (ref 0.20–1.20)
BUN: 11.3 mg/dL (ref 7.0–26.0)
CHLORIDE: 106 meq/L (ref 98–109)
CO2: 25 meq/L (ref 22–29)
CREATININE: 0.9 mg/dL (ref 0.6–1.1)
Calcium: 9.4 mg/dL (ref 8.4–10.4)
EGFR: 68 mL/min/{1.73_m2} — ABNORMAL LOW (ref >90)
GLUCOSE: 132 mg/dL (ref 70–140)
Potassium: 3.8 mEq/L (ref 3.5–5.1)
SODIUM: 141 meq/L (ref 136–145)
TOTAL PROTEIN: 7 g/dL (ref 6.4–8.3)

## 2015-04-04 LAB — RESEARCH LABS

## 2015-04-04 MED ORDER — CYANOCOBALAMIN 1000 MCG/ML IJ SOLN
INTRAMUSCULAR | Status: AC
Start: 2015-04-04 — End: 2015-04-04
  Filled 2015-04-04: qty 1

## 2015-04-04 MED ORDER — PROCHLORPERAZINE MALEATE 10 MG PO TABS
ORAL_TABLET | ORAL | Status: AC
  Filled 2015-04-04: qty 1

## 2015-04-04 MED ORDER — DEXAMETHASONE SODIUM PHOSPHATE 100 MG/10ML IJ SOLN
10.0000 mg | Freq: Once | INTRAMUSCULAR | Status: AC
  Administered 2015-04-04: 10 mg via INTRAVENOUS
  Filled 2015-04-04: qty 1

## 2015-04-04 MED ORDER — SODIUM CHLORIDE 0.9 % IV SOLN
Freq: Once | INTRAVENOUS | Status: AC
  Administered 2015-04-04: 11:00:00 via INTRAVENOUS

## 2015-04-04 MED ORDER — ALPRAZOLAM 0.25 MG PO TABS: 0.2500 mg | ORAL_TABLET | Freq: Every evening | ORAL | Status: DC | PRN

## 2015-04-04 MED ORDER — PROCHLORPERAZINE MALEATE 10 MG PO TABS
10.0000 mg | ORAL_TABLET | Freq: Once | ORAL | Status: AC
  Administered 2015-04-04: 10 mg via ORAL

## 2015-04-04 MED ORDER — CYANOCOBALAMIN 1000 MCG/ML IJ SOLN
1000.0000 ug | Freq: Once | INTRAMUSCULAR | Status: AC
  Administered 2015-04-04: 1000 ug via INTRAMUSCULAR

## 2015-04-04 MED ORDER — MORPHINE SULFATE 30 MG PO TABS: 30.0000 mg | ORAL_TABLET | ORAL | Status: DC | PRN

## 2015-04-04 MED ORDER — HEPARIN SOD (PORK) LOCK FLUSH 100 UNIT/ML IV SOLN
500.0000 [IU] | Freq: Once | INTRAVENOUS | Status: AC | PRN
  Administered 2015-04-04: 500 [IU]
  Filled 2015-04-04: qty 5

## 2015-04-04 MED ORDER — SODIUM CHLORIDE 0.9 % IV SOLN
500.0000 mg/m2 | Freq: Once | INTRAVENOUS | Status: AC
  Administered 2015-04-04: 900 mg via INTRAVENOUS
  Filled 2015-04-04: qty 32

## 2015-04-04 MED ORDER — SODIUM CHLORIDE 0.9 % IJ SOLN
10.0000 mL | INTRAMUSCULAR | Status: DC | PRN
  Administered 2015-04-04: 10 mL
  Filled 2015-04-04: qty 10

## 2015-04-04 NOTE — Patient Instructions (Signed)
[facility] Discharge Instructions for Patients Receiving Chemotherapy  Today you received the following chemotherapy agents Alimta.  To help prevent nausea and vomiting after your treatment, we encourage you to take your nausea medication as prescribed.   If you develop nausea and vomiting that is not controlled by your nausea medication, call the clinic.   BELOW ARE SYMPTOMS THAT SHOULD BE REPORTED IMMEDIATELY:  *FEVER GREATER THAN 100.5 F  *CHILLS WITH OR WITHOUT FEVER  NAUSEA AND VOMITING THAT IS NOT CONTROLLED WITH YOUR NAUSEA MEDICATION  *UNUSUAL SHORTNESS OF BREATH  *UNUSUAL BRUISING OR BLEEDING  TENDERNESS IN MOUTH AND THROAT WITH OR WITHOUT PRESENCE OF ULCERS  *URINARY PROBLEMS  *BOWEL PROBLEMS  UNUSUAL RASH Items with * indicate a potential emergency and should be followed up as soon as possible.  Feel free to call the clinic you have any questions or concerns. The clinic phone number is [phone].  Please show the CHEMO ALERT CARD at check-in to the Emergency Department and triage nurse.   

## 2015-04-04 NOTE — Progress Notes (Signed)
04/04/15 - BMS [id number] - Questionnaires (PRO's) - Patient into the cancer center for routine visit.  Patient given PRO's upon arrival to the cancer center.  Patient completed the PRO's (EQ-5D-3L first and then the LCSS booklet) before any study procedures were done.  The PRO's were checked for completeness.  The patient then had research labs drawn today.  The patient was thanked for her continued support in this clinical trial.   Remer Macho 04/04/15 - 10:20 am

## 2015-04-04 NOTE — Telephone Encounter (Signed)
pt appts already sch-gave pt copy of avs

## 2015-04-04 NOTE — Progress Notes (Signed)
[place] Telephone:([phone]) [phone]   Fax:(336) [phone]  OFFICE PROGRESS NOTE  [name], MD [place] [address]  Diagnosis:  Metastatic non-small cell lung cancer initially diagnosed with locally advanced stage IIIB with right Pancoast tumor involving the vertebral body as well as the foraminal canal invasion with spinal cord compression in October of 2007.   Prior Therapy:  1. Status post concurrent chemoradiation with weekly carboplatin and paclitaxel, last dose was given November 18, 2005. 2. 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 3. Status post gamma knife radiotherapy to a solitary brain lesion located in the superior frontal area of the brain at [facility] in April of 2008. 4. Status post palliative radiotherapy to the lateral abdominal wall metastatic lesion under the care of Dr. Lisbeth Renshaw, completed March of 2009. 5. Status post 6 cycles of systemic chemotherapy with carboplatin and Alimta. Last dose was given July 26, 2007 with disease stabilization. 6. Gamma knife stereotactic radiotherapy to 2 brain lesions one involving the right frontal dural based as well as right parietal lesion performed on 05/07/2012 under the care of Dr. Vallarie Mare at [facility].  Current Therapy:  Maintenance chemotherapy with Alimta 500 mg per meter squared given every 3 weeks. The patient is status post 131 cycles.   CHEMOTHERAPY INTENT: Palliative/maintenance  CURRENT # OF CHEMOTHERAPY CYCLES: 132 CURRENT ANTIEMETICS: Zofran, dexamethasone and Compazine  CURRENT SMOKING STATUS: Quit smoking 10/07/2005  ORAL CHEMOTHERAPY AND CONSENT: None  CURRENT BISPHOSPHONATES USE: None  PAIN MANAGEMENT: 0/10 currently on morphine  NARCOTICS INDUCED CONSTIPATION: No constipation  LIVING WILL AND CODE STATUS: No CODE BLUE  INTERVAL HISTORY: Danielle [name] 65 y.o. female  returns to the clinic today for followup visit.  The patient is feeling fine today with no specific complaints. She had a recent MRI of the brain at [facility] that showed no concerning findings for disease progression in the brain. She is requesting refill of MS Contin and Xanax today. She is tolerating her maintenance treatment with single agent Alimta fairly well with no significant adverse effects. The patient denied having any chest pain, shortness of breath, or hemoptysis. No nausea or vomiting. The patient denied having any weight loss or night sweats. She is here today to start cycle #132 her maintenance chemotherapy.  MEDICAL HISTORY: Past Medical History  Diagnosis Date  . Hypertension   . Anemia   . H/O: pneumonia   . History of tobacco abuse quit 10/08    on nicotine patch  . Fibromyalgia   . Hypokalemia   . DJD (degenerative joint disease), cervical   . Thrush 12/11/2010  . CVA (cerebral vascular accident) ([place]) 04/2014    pt states had 2 cva's within 2 wks  . Lung cancer ([place]) dx'd 09/2005    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 had chemo and radiation for lung ca    ALLERGIES:  is allergic to contrast media; iohexol; sulfa drugs cross reactors; and co-trimoxazole injection.  MEDICATIONS:  Current Outpatient Prescriptions  Medication Sig Dispense Refill  . amLODipine (NORVASC) 10 MG tablet TAKE 1 TABLET (10 MG TOTAL) BY MOUTH DAILY. 90 tablet 1  . aspirin EC 325 MG tablet TAKE 1 TABLET (325 MG TOTAL) BY MOUTH DAILY. 30 tablet 0  . atorvastatin (LIPITOR) 40 MG tablet Take 1 tablet (40 mg total) by mouth daily at 6 PM. 90 tablet 3  .  fluticasone (FLONASE) 50 MCG/ACT nasal spray USE 1-2 SPRAYS IN EACH NOSTRIL DAILY (Patient taking differently: USE 1-2 SPRAYS IN EACH NOSTRIL DAILY PRN CONGESTION) 16 g 11  . folic acid (FOLVITE) 1 MG tablet TAKE 1 TABLET BY MOUTH DAILY. 90 tablet 1  . furosemide (LASIX) 20 MG tablet TAKE 1 TABLET (20 MG TOTAL) BY MOUTH DAILY AS  NEEDED FOR EDEMA. 10 tablet 0  . lidocaine-prilocaine (EMLA) cream Apply to Port-A-Cath as directed 30 g 1  . lisinopril (PRINIVIL,ZESTRIL) 20 MG tablet TAKE 2 TABLETS (40 MG TOTAL) BY MOUTH DAILY. 60 tablet 5  . morphine (MS CONTIN) 60 MG 12 hr tablet Take 1 tablet (60 mg total) by mouth 2 (two) times daily. 60 tablet 0  . Olopatadine HCl 0.2 % SOLN Apply 1 drop to eye daily. 2.5 mL 11  . ondansetron (ZOFRAN) 8 MG tablet TAKE 1 TABLET (8 MG TOTAL) BY MOUTH 2 (TWO) TIMES DAILY AS NEEDED FOR NAUSEA OR VOMITING. 30 tablet 2  . ALPRAZolam (XANAX) 0.25 MG tablet Take 1 tablet (0.25 mg total) by mouth at bedtime as needed. for sleep 30 tablet 0  . morphine (MSIR) 30 MG tablet Take 1 tablet (30 mg total) by mouth every 4 (four) hours as needed (breakthrough pain). 60 tablet 0  . predniSONE (DELTASONE) 50 MG tablet Take 1 tablet (50 mg total) by mouth See admin instructions. Take 1 tablet (50 mg) 13 hours before CT scan, 7 hours before CT scan and 1 hour before CT scan (every 9 weeks) (Patient not taking: Reported on 04/04/2015) 3 tablet 1   No current facility-administered medications for this visit.    SURGICAL HISTORY:  Past Surgical History  Procedure Laterality Date  . Cervical laminectomy  1995  . Other surgical history  2008    Gamma knife surgery to remove brain met   . 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 Knee surgery  1990    Left x 2  . Portacath placement  -[name]    TIP IN CAVOATRIAL JUNCTION  . Loop recorder implant N/A 04/19/2014    Procedure: LOOP RECORDER IMPLANT;  Surgeon: Thompson Grayer, MD;  Location: [facility] CATH LAB;  Service: Cardiovascular;  Laterality: N/A;  . Tee without cardioversion N/A 04/19/2014    Procedure: TRANSESOPHAGEAL ECHOCARDIOGRAM (TEE);  Surgeon: Larey Dresser, MD;  Location: [facility] ENDOSCOPY;  Service: Cardiovascular;  Laterality: N/A;    REVIEW OF SYSTEMS:  A comprehensive review of systems was negative.   PHYSICAL EXAMINATION:  General appearance: alert, cooperative and no distress Head: Normocephalic, without obvious abnormality, atraumatic Neck: no adenopathy, no JVD, supple, symmetrical, trachea midline and thyroid not enlarged, symmetric, no tenderness/mass/nodules Lymph nodes: Cervical, supraclavicular, and axillary nodes normal. Resp: clear to auscultation bilaterally Back: symmetric, no curvature. ROM normal. No CVA tenderness. Cardio: regular rate and rhythm, S1, S2 normal, no murmur, click, rub or gallop GI: soft, non-tender; bowel sounds normal; no masses,  no organomegaly Extremities: extremities normal, atraumatic, no cyanosis or edema Neurologic: Alert and oriented X 3, normal strength and tone. Normal symmetric reflexes. Normal coordination and gait  ECOG PERFORMANCE STATUS: 0 - Asymptomatic  Blood pressure 139/58, pulse 86, temperature 97.7 F (36.5 C), temperature source Oral, resp. rate 18, height '5\' 3"'  (1.6 m), weight 149 lb 14.4 oz (67.994 kg), SpO2 98 %.  LABORATORY DATA: Lab Results  Component Value Date   WBC 8.6 04/04/2015   HGB 11.7 04/04/2015   HCT  36.7 04/04/2015   MCV 92.7 04/04/2015   PLT 324 04/04/2015      Chemistry      Component Value Date/Time   NA 141 04/04/2015 1006   NA 139 01/26/2015 0818   K 3.8 04/04/2015 1006   K 4.1 01/26/2015 0818   CL 103 01/26/2015 0818   CL 106 06/30/2012 1004   CO2 25 04/04/2015 1006   CO2 28 01/26/2015 0818   BUN 11.3 04/04/2015 1006   BUN 13 01/26/2015 0818   CREATININE 0.9 04/04/2015 1006   CREATININE 0.93 01/26/2015 0818      Component Value Date/Time   CALCIUM 9.4 04/04/2015 1006   CALCIUM 9.0 01/26/2015 0818   ALKPHOS 89 04/04/2015 1006   ALKPHOS 79 01/26/2015 0818   AST 21 04/04/2015 1006   AST 18 01/26/2015 0818   ALT 14 04/04/2015 1006   ALT 15 01/26/2015 0818   BILITOT 0.39 04/04/2015 1006   BILITOT 0.4 01/26/2015 0818       RADIOGRAPHIC STUDIES: No results found. ASSESSMENT AND PLAN: This is a very pleasant  64 years old white female with metastatic non-small cell lung cancer currently undergoing maintenance chemotherapy with single agent Alimta status post 131 cycles. The patient is tolerating her treatment fairly well with no significant adverse effects.  I recommended for her to continue with her maintenance chemotherapy and the patient will receive cycle #132 today. For pain management, she will continue on her current pain medication with MS Contin and MS IR. She was given a refill of MS Contin and Xanax today. The patient would come back for follow-up visit in 3 weeks for reevaluation before starting cycle #133 of her maintenance therapy. She was advised to call immediately if she has any concerning symptoms in the interval. The patient voices understanding of current disease status and treatment options and is in agreement with the current care plan.  All questions were answered. The patient knows to call the clinic with any problems, questions or concerns. We can certainly see the patient much sooner if necessary.  Disclaimer: This note was dictated with voice recognition software. Similar sounding words can inadvertently be transcribed and may not be corrected upon review.

## 2015-04-07 ENCOUNTER — Other Ambulatory Visit: Payer: Self-pay | Admitting: Internal Medicine

## 2015-04-13 ENCOUNTER — Other Ambulatory Visit: Payer: Self-pay

## 2015-04-13 DIAGNOSIS — C3410 Malignant neoplasm of upper lobe, unspecified bronchus or lung: Secondary | ICD-10-CM

## 2015-04-13 DIAGNOSIS — C3490 Malignant neoplasm of unspecified part of unspecified bronchus or lung: Secondary | ICD-10-CM

## 2015-04-13 MED ORDER — MORPHINE SULFATE ER 60 MG PO TBCR: 60.0000 mg | EXTENDED_RELEASE_TABLET | Freq: Two times a day (BID) | ORAL | Status: DC

## 2015-04-13 NOTE — Telephone Encounter (Signed)
Morphine '60mg'$  rx refilled. Pt notified

## 2015-04-13 NOTE — Telephone Encounter (Signed)
Patient called today requesting a refill on her long acting morphine 60 mg.  She would like to pick it up tomorrow morning.

## 2015-04-14 ENCOUNTER — Ambulatory Visit (INDEPENDENT_AMBULATORY_CARE_PROVIDER_SITE_OTHER): Payer: Medicare Other | Admitting: *Deleted

## 2015-04-14 DIAGNOSIS — I639 Cerebral infarction, unspecified: Secondary | ICD-10-CM | POA: Diagnosis not present

## 2015-04-17 ENCOUNTER — Other Ambulatory Visit: Payer: Self-pay | Admitting: Family Medicine

## 2015-04-17 NOTE — Progress Notes (Signed)
[facility] Summary Report / Loop Recorder 

## 2015-04-24 ENCOUNTER — Other Ambulatory Visit: Payer: Self-pay | Admitting: Family Medicine

## 2015-04-25 ENCOUNTER — Telehealth: Payer: Self-pay | Admitting: Internal Medicine

## 2015-04-25 ENCOUNTER — Ambulatory Visit (HOSPITAL_BASED_OUTPATIENT_CLINIC_OR_DEPARTMENT_OTHER): Payer: Medicare Other | Admitting: Internal Medicine

## 2015-04-25 ENCOUNTER — Ambulatory Visit (HOSPITAL_BASED_OUTPATIENT_CLINIC_OR_DEPARTMENT_OTHER): Payer: Medicare Other

## 2015-04-25 ENCOUNTER — Encounter: Payer: Self-pay | Admitting: Internal Medicine

## 2015-04-25 ENCOUNTER — Other Ambulatory Visit (HOSPITAL_BASED_OUTPATIENT_CLINIC_OR_DEPARTMENT_OTHER): Payer: Medicare Other

## 2015-04-25 VITALS — BP 136/61 | HR 84 | Temp 97.8°F | Resp 20 | Ht 63.0 in | Wt 151.7 lb

## 2015-04-25 DIAGNOSIS — C3490 Malignant neoplasm of unspecified part of unspecified bronchus or lung: Secondary | ICD-10-CM | POA: Diagnosis not present

## 2015-04-25 DIAGNOSIS — C3411 Malignant neoplasm of upper lobe, right bronchus or lung: Secondary | ICD-10-CM | POA: Diagnosis not present

## 2015-04-25 DIAGNOSIS — C3491 Malignant neoplasm of unspecified part of right bronchus or lung: Secondary | ICD-10-CM

## 2015-04-25 DIAGNOSIS — Z5111 Encounter for antineoplastic chemotherapy: Secondary | ICD-10-CM

## 2015-04-25 DIAGNOSIS — C7931 Secondary malignant neoplasm of brain: Secondary | ICD-10-CM | POA: Diagnosis not present

## 2015-04-25 LAB — CBC WITH DIFFERENTIAL/PLATELET
BASO%: 0.2 % (ref 0.0–2.0)
Basophils Absolute: 0 10*3/uL (ref 0.0–0.1)
EOS%: 0.9 % (ref 0.0–7.0)
Eosinophils Absolute: 0.1 10*3/uL (ref 0.0–0.5)
HCT: 36.8 % (ref 34.8–46.6)
HGB: 11.7 g/dL (ref 11.6–15.9)
LYMPH#: 2.1 10*3/uL (ref 0.9–3.3)
LYMPH%: 24.3 % (ref 14.0–49.7)
MCH: 29.6 pg (ref 25.1–34.0)
MCHC: 31.8 g/dL (ref 31.5–36.0)
MCV: 93.2 fL (ref 79.5–101.0)
MONO#: 1.1 10*3/uL — ABNORMAL HIGH (ref 0.1–0.9)
MONO%: 13.3 % (ref 0.0–14.0)
NEUT#: 5.2 10*3/uL (ref 1.5–6.5)
NEUT%: 61.3 % (ref 38.4–76.8)
Platelets: 365 10*3/uL (ref 145–400)
RBC: 3.95 10*6/uL (ref 3.70–5.45)
RDW: 15.4 % — ABNORMAL HIGH (ref 11.2–14.5)
WBC: 8.5 10*3/uL (ref 3.9–10.3)

## 2015-04-25 LAB — COMPREHENSIVE METABOLIC PANEL
ALBUMIN: 3.3 g/dL — AB (ref 3.5–5.0)
ALK PHOS: 84 U/L (ref 40–150)
ALT: 14 U/L (ref 0–55)
AST: 24 U/L (ref 5–34)
Anion Gap: 8 mEq/L (ref 3–11)
BUN: 15.1 mg/dL (ref 7.0–26.0)
CALCIUM: 9.6 mg/dL (ref 8.4–10.4)
CO2: 28 mEq/L (ref 22–29)
CREATININE: 0.9 mg/dL (ref 0.6–1.1)
Chloride: 105 mEq/L (ref 98–109)
EGFR: 66 mL/min/{1.73_m2} — ABNORMAL LOW (ref >90)
GLUCOSE: 117 mg/dL (ref 70–140)
POTASSIUM: 4.5 meq/L (ref 3.5–5.1)
SODIUM: 141 meq/L (ref 136–145)
Total Bilirubin: <0.3 mg/dL (ref 0.20–1.20)
Total Protein: 7 g/dL (ref 6.4–8.3)

## 2015-04-25 MED ORDER — SODIUM CHLORIDE 0.9 % IV SOLN
Freq: Once | INTRAVENOUS | Status: AC
  Administered 2015-04-25: 10:00:00 via INTRAVENOUS
  Filled 2015-04-25: qty 4

## 2015-04-25 MED ORDER — FUROSEMIDE 20 MG PO TABS: ORAL_TABLET | ORAL | Status: DC

## 2015-04-25 MED ORDER — SODIUM CHLORIDE 0.9 % IJ SOLN
10.0000 mL | INTRAMUSCULAR | Status: DC | PRN
  Administered 2015-04-25: 10 mL
  Filled 2015-04-25: qty 10

## 2015-04-25 MED ORDER — SODIUM CHLORIDE 0.9 % IV SOLN
Freq: Once | INTRAVENOUS | Status: AC
  Administered 2015-04-25: 10:00:00 via INTRAVENOUS

## 2015-04-25 MED ORDER — PEMETREXED DISODIUM CHEMO INJECTION 500 MG
500.0000 mg/m2 | Freq: Once | INTRAVENOUS | Status: AC
  Administered 2015-04-25: 900 mg via INTRAVENOUS
  Filled 2015-04-25: qty 32

## 2015-04-25 MED ORDER — PREDNISONE 50 MG PO TABS: 50.0000 mg | ORAL_TABLET | ORAL | Status: DC

## 2015-04-25 MED ORDER — LIDOCAINE-PRILOCAINE 2.5-2.5 % EX CREA: TOPICAL_CREAM | CUTANEOUS | Status: DC

## 2015-04-25 MED ORDER — HEPARIN SOD (PORK) LOCK FLUSH 100 UNIT/ML IV SOLN
500.0000 [IU] | Freq: Once | INTRAVENOUS | Status: AC | PRN
  Administered 2015-04-25: 500 [IU]
  Filled 2015-04-25: qty 5

## 2015-04-25 NOTE — Telephone Encounter (Signed)
GAVE AND PRINTED APPT SCHED AND AVS FO RPT FOR mAY THRU jULY

## 2015-04-25 NOTE — Progress Notes (Signed)
[place] Telephone:(336) [phone]   Fax:(336) [phone]  OFFICE PROGRESS NOTE  [name], MD [place] [address]  Diagnosis:  Metastatic non-small cell lung cancer initially diagnosed with locally advanced stage IIIB with right Pancoast tumor involving the vertebral body as well as the foraminal canal invasion with spinal cord compression in October of 2007.   Prior Therapy:  1. Status post concurrent chemoradiation with weekly carboplatin and paclitaxel, last dose was given November 18, 2005. 2. 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 3. Status post gamma knife radiotherapy to a solitary brain lesion located in the superior frontal area of the brain at [facility] in April of 2008. 4. Status post palliative radiotherapy to the lateral abdominal wall metastatic lesion under the care of Dr. Lisbeth Renshaw, completed March of 2009. 5. Status post 6 cycles of systemic chemotherapy with carboplatin and Alimta. Last dose was given July 26, 2007 with disease stabilization. 6. Gamma knife stereotactic radiotherapy to 2 brain lesions one involving the right frontal dural based as well as right parietal lesion performed on 05/07/2012 under the care of Dr. Vallarie Mare at [facility].  Current Therapy:  Maintenance chemotherapy with Alimta 500 mg per meter squared given every 3 weeks. The patient is status post 131 cycles.   CHEMOTHERAPY INTENT: Palliative/maintenance  CURRENT # OF CHEMOTHERAPY CYCLES: 132 CURRENT ANTIEMETICS: Zofran, dexamethasone and Compazine  CURRENT SMOKING STATUS: Quit smoking 10/07/2005  ORAL CHEMOTHERAPY AND CONSENT: None  CURRENT BISPHOSPHONATES USE: None  PAIN MANAGEMENT: 0/10 currently on morphine  NARCOTICS INDUCED CONSTIPATION: No constipation  LIVING WILL AND CODE STATUS: No CODE BLUE  INTERVAL HISTORY: Danielle [name] 65 y.o. female  returns to the clinic today for followup visit accompanied by her sister. The patient is feeling fine today with no specific complaints.  She is requesting refill of Lasix and Emla cream. She had intermittent swelling of the lower extremities improved with Lasix and elevation of the legs. She is tolerating her maintenance treatment with single agent Alimta status post 131 cycles fairly well with no significant adverse effects. The patient denied having any chest pain, shortness of breath, or hemoptysis. No nausea or vomiting. The patient denied having any weight loss or night sweats. She is here today to start cycle #132 her maintenance chemotherapy.  MEDICAL HISTORY: Past Medical History  Diagnosis Date  . Hypertension   . Anemia   . H/O: pneumonia   . History of tobacco abuse quit 10/08    on nicotine patch  . Fibromyalgia   . Hypokalemia   . DJD (degenerative joint disease), cervical   . Thrush 12/11/2010  . CVA (cerebral vascular accident) ([place]) 04/2014    pt states had 2 cva's within 2 wks  . Lung cancer ([place]) dx'd 09/2005    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 had chemo and radiation for lung ca    ALLERGIES:  is allergic to contrast media; iohexol; sulfa drugs cross reactors; and co-trimoxazole injection.  MEDICATIONS:  Current Outpatient Prescriptions  Medication Sig Dispense Refill  . ALPRAZolam (XANAX) 0.25 MG tablet Take 1 tablet (0.25 mg total) by mouth at bedtime as needed. for sleep 30 tablet 0  . amLODipine (NORVASC) 10 MG tablet TAKE 1 TABLET (10 MG TOTAL) BY MOUTH DAILY. 90 tablet 1  . aspirin EC 325 MG tablet TAKE 1 TABLET (325 MG TOTAL) BY MOUTH DAILY. 30 tablet 0  .  atorvastatin (LIPITOR) 40 MG tablet TAKE 1 TABLET BY MOUTH EVERY DAY AT 6 PM 90 tablet 2  . fluticasone (FLONASE) 50 MCG/ACT nasal spray USE 1-2 SPRAYS IN EACH NOSTRIL DAILY (Patient taking differently: USE 1-2 SPRAYS IN EACH NOSTRIL DAILY PRN CONGESTION) 16 g 11  . folic acid (FOLVITE) 1 MG tablet  TAKE 1 TABLET BY MOUTH DAILY. 90 tablet 1  . furosemide (LASIX) 20 MG tablet TAKE 1 TABLET (20 MG TOTAL) BY MOUTH DAILY AS NEEDED FOR EDEMA. 10 tablet 0  . lidocaine-prilocaine (EMLA) cream Apply to Port-A-Cath as directed 30 g 1  . lisinopril (PRINIVIL,ZESTRIL) 20 MG tablet TAKE 2 TABLETS (40 MG TOTAL) BY MOUTH DAILY. 60 tablet 5  . morphine (MS CONTIN) 60 MG 12 hr tablet Take 1 tablet (60 mg total) by mouth 2 (two) times daily. 60 tablet 0  . morphine (MSIR) 30 MG tablet Take 1 tablet (30 mg total) by mouth every 4 (four) hours as needed (breakthrough pain). 60 tablet 0  . Olopatadine HCl 0.2 % SOLN Apply 1 drop to eye daily. 2.5 mL 11  . ondansetron (ZOFRAN) 8 MG tablet TAKE 1 TABLET (8 MG TOTAL) BY MOUTH 2 (TWO) TIMES DAILY AS NEEDED FOR NAUSEA OR VOMITING. 30 tablet 2  . predniSONE (DELTASONE) 50 MG tablet Take 1 tablet (50 mg total) by mouth See admin instructions. Take 1 tablet (50 mg) 13 hours before CT scan, 7 hours before CT scan and 1 hour before CT scan (every 9 weeks) 3 tablet 1   No current facility-administered medications for this visit.    SURGICAL HISTORY:  Past Surgical History  Procedure Laterality Date  . Cervical laminectomy  1995  . Other surgical history  2008    Gamma knife surgery to remove brain met   . 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 Knee surgery  1990    Left x 2  . Portacath placement  -[name]    TIP IN CAVOATRIAL JUNCTION  . Loop recorder implant N/A 04/19/2014    Procedure: LOOP RECORDER IMPLANT;  Surgeon: Thompson Grayer, MD;  Location: [facility] CATH LAB;  Service: Cardiovascular;  Laterality: N/A;  . Tee without cardioversion N/A 04/19/2014    Procedure: TRANSESOPHAGEAL ECHOCARDIOGRAM (TEE);  Surgeon: Larey Dresser, MD;  Location: [facility] ENDOSCOPY;  Service: Cardiovascular;  Laterality: N/A;    REVIEW OF SYSTEMS:  A comprehensive review of systems was negative.   PHYSICAL EXAMINATION: General appearance: alert, cooperative  and no distress Head: Normocephalic, without obvious abnormality, atraumatic Neck: no adenopathy, no JVD, supple, symmetrical, trachea midline and thyroid not enlarged, symmetric, no tenderness/mass/nodules Lymph nodes: Cervical, supraclavicular, and axillary nodes normal. Resp: clear to auscultation bilaterally Back: symmetric, no curvature. ROM normal. No CVA tenderness. Cardio: regular rate and rhythm, S1, S2 normal, no murmur, click, rub or gallop GI: soft, non-tender; bowel sounds normal; no masses,  no organomegaly Extremities: extremities normal, atraumatic, no cyanosis or edema Neurologic: Alert and oriented X 3, normal strength and tone. Normal symmetric reflexes. Normal coordination and gait  ECOG PERFORMANCE STATUS: 0 - Asymptomatic  Blood pressure 136/61, pulse 84, temperature 97.8 F (36.6 C), temperature source Oral, resp. rate 20, height '5\' 3"'  (1.6 m), weight 151 lb 11.2 oz (68.811 kg), SpO2 97 %.  LABORATORY DATA: Lab Results  Component Value Date   WBC 8.5 04/25/2015   HGB 11.7 04/25/2015   HCT 36.8 04/25/2015   MCV 93.2 04/25/2015   PLT  365 04/25/2015      Chemistry      Component Value Date/Time   NA 141 04/04/2015 1006   NA 139 01/26/2015 0818   K 3.8 04/04/2015 1006   K 4.1 01/26/2015 0818   CL 103 01/26/2015 0818   CL 106 06/30/2012 1004   CO2 25 04/04/2015 1006   CO2 28 01/26/2015 0818   BUN 11.3 04/04/2015 1006   BUN 13 01/26/2015 0818   CREATININE 0.9 04/04/2015 1006   CREATININE 0.93 01/26/2015 0818      Component Value Date/Time   CALCIUM 9.4 04/04/2015 1006   CALCIUM 9.0 01/26/2015 0818   ALKPHOS 89 04/04/2015 1006   ALKPHOS 79 01/26/2015 0818   AST 21 04/04/2015 1006   AST 18 01/26/2015 0818   ALT 14 04/04/2015 1006   ALT 15 01/26/2015 0818   BILITOT 0.39 04/04/2015 1006   BILITOT 0.4 01/26/2015 0818       RADIOGRAPHIC STUDIES: No results found. ASSESSMENT AND PLAN: This is a very pleasant 65 years old white female with  metastatic non-small cell lung cancer currently undergoing maintenance chemotherapy with single agent Alimta status post 131 cycles. The patient is tolerating her treatment fairly well with no significant adverse effects.  I recommended for her to continue with her maintenance chemotherapy and the patient will receive cycle #132 today. For pain management, she will continue on her current pain medication with MS Contin and MS IR.  The patient would come back for follow-up visit in 3 weeks for reevaluation before starting cycle #133 of her maintenance therapy after repeating CT scan of the chest, abdomen and pelvis for restaging of her disease. She was advised to call immediately if she has any concerning symptoms in the interval. The patient voices understanding of current disease status and treatment options and is in agreement with the current care plan.  All questions were answered. The patient knows to call the clinic with any problems, questions or concerns. We can certainly see the patient much sooner if necessary.  Disclaimer: This note was dictated with voice recognition software. Similar sounding words can inadvertently be transcribed and may not be corrected upon review.

## 2015-04-25 NOTE — Patient Instructions (Signed)
[facility] Discharge Instructions for Patients Receiving Chemotherapy  Today you received the following chemotherapy agents ALIMTA To help prevent nausea and vomiting after your treatment, we encourage you to take your nausea medication as prescribed   If you develop nausea and vomiting that is not controlled by your nausea medication, call the clinic.   BELOW ARE SYMPTOMS THAT SHOULD BE REPORTED IMMEDIATELY:  *FEVER GREATER THAN 100.5 F  *CHILLS WITH OR WITHOUT FEVER  NAUSEA AND VOMITING THAT IS NOT CONTROLLED WITH YOUR NAUSEA MEDICATION  *UNUSUAL SHORTNESS OF BREATH  *UNUSUAL BRUISING OR BLEEDING  TENDERNESS IN MOUTH AND THROAT WITH OR WITHOUT PRESENCE OF ULCERS  *URINARY PROBLEMS  *BOWEL PROBLEMS  UNUSUAL RASH Items with * indicate a potential emergency and should be followed up as soon as possible.  Feel free to call the clinic you have any questions or concerns. The clinic phone number is [phone].  Please show the CHEMO ALERT CARD at check-in to the Emergency Department and triage nurse.   

## 2015-04-28 ENCOUNTER — Other Ambulatory Visit: Payer: Self-pay | Admitting: *Deleted

## 2015-04-28 DIAGNOSIS — C3411 Malignant neoplasm of upper lobe, right bronchus or lung: Secondary | ICD-10-CM

## 2015-05-02 ENCOUNTER — Telehealth: Payer: Self-pay

## 2015-05-02 NOTE — Telephone Encounter (Signed)
Prior auth form placed in [place] box.

## 2015-05-02 NOTE — Telephone Encounter (Signed)
[facility] called to say that a prior auth needs to be completed on patient's emla cream.  Writer provided fax number for [place] area.

## 2015-05-03 ENCOUNTER — Telehealth: Payer: Self-pay | Admitting: Medical Oncology

## 2015-05-03 ENCOUNTER — Encounter: Payer: Self-pay | Admitting: Internal Medicine

## 2015-05-03 DIAGNOSIS — C3400 Malignant neoplasm of unspecified main bronchus: Secondary | ICD-10-CM

## 2015-05-03 MED ORDER — ALPRAZOLAM 0.25 MG PO TABS: 0.2500 mg | ORAL_TABLET | Freq: Every evening | ORAL | Status: DC | PRN

## 2015-05-03 NOTE — Progress Notes (Signed)
Sent prior auth for lidocaine cream

## 2015-05-03 NOTE — Telephone Encounter (Signed)
Xanax refill called in. 

## 2015-05-04 ENCOUNTER — Encounter: Payer: Self-pay | Admitting: Internal Medicine

## 2015-05-04 NOTE — Progress Notes (Signed)
I faxed [phone] for lidocaine cream prior auth

## 2015-05-04 NOTE — Progress Notes (Signed)
Per [employer] lidocaine approved 02/03/15-08/02/15  [id number] I sent to medical records

## 2015-05-08 ENCOUNTER — Other Ambulatory Visit: Payer: Self-pay | Admitting: Internal Medicine

## 2015-05-08 ENCOUNTER — Encounter: Payer: Self-pay | Admitting: Neurology

## 2015-05-08 ENCOUNTER — Ambulatory Visit (INDEPENDENT_AMBULATORY_CARE_PROVIDER_SITE_OTHER): Payer: Medicare Other | Admitting: Neurology

## 2015-05-08 VITALS — BP 115/63 | HR 88 | Ht 63.0 in | Wt 151.2 lb

## 2015-05-08 DIAGNOSIS — I63412 Cerebral infarction due to embolism of left middle cerebral artery: Secondary | ICD-10-CM

## 2015-05-08 DIAGNOSIS — E785 Hyperlipidemia, unspecified: Secondary | ICD-10-CM | POA: Diagnosis not present

## 2015-05-08 DIAGNOSIS — C3490 Malignant neoplasm of unspecified part of unspecified bronchus or lung: Secondary | ICD-10-CM | POA: Diagnosis not present

## 2015-05-08 DIAGNOSIS — E1169 Type 2 diabetes mellitus with other specified complication: Secondary | ICD-10-CM | POA: Insufficient documentation

## 2015-05-08 NOTE — Patient Instructions (Signed)
-   continue ASA and lipitor for stroke prevention - continue to monitor loop recorder - follow up with oncologist regularly  - Follow up with your primary care physician for stroke risk factor modification. Recommend maintain blood pressure goal <130/80, diabetes with hemoglobin A1c goal below 6.5% and lipids with LDL cholesterol goal below 70 mg/dL.  - check BP at home - follow up in one year.

## 2015-05-08 NOTE — Progress Notes (Signed)
STROKE NEUROLOGY FOLLOW UP NOTE  NAME: Danielle [name] DOB: 1950-09-08  REASON FOR VISIT: stroke follow up HISTORY FROM: pt and chart  Today we had the pleasure of seeing Danielle [name] in follow-up at our Neurology Clinic. Pt was accompanied by no one.   History Summary Danielle [name] is a 65 y.o. 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 Although hypercoagulable state due to malignancy is a possibility, however, after discuss with her oncologist Dr. Rogue Jury, it was felt less likely. Stroke work up including TTE, CUS, TEE, DVT not revealing, therefore Loop recorder placed. EEG showed bifrontal sharps, so vimpat was started. LDL 105, switched from zocor to lipitor. She was d/c to home with ASA and lipitor and outpt speech therapy.  07/22/14 follow up - the patient has been doing well, language much improved and near at baseline now. She followed up with PCP and vimpat was discontinued with unknown reason. She continues to follow up with oncology for maintenance chemo. Her BP 99/66 but she is on norvasc and metoprolol. She is asking for driving.   11/07/14 follow up - pt has been doing well. Language normal now. Back to driving, denies any problem with driving. Loop recorder no afib events. EEG repeat was normal. She follows with oncology for routine maintenance infusion. Had recent MRI brain showed stable, no concern of brain mets relapse. BP stable today 109/65.  Interval History During the interval time, pt has been doing well. No recurrent stroke like symptoms. Language normal. Followed up with onc radiology in [facility] and repeat MRI brian showed no evidence of tumor. Also followed with oncologist and  repeat panCT showed no metastasis of lung cancer. Continues maintenance chemo. BP today 115/63. No other complains. Loop recorder no afib.  REVIEW OF SYSTEMS: Full 14 system review of systems performed and notable only for those listed below and in HPI above, all others are negative:  Constitutional:   Cardiovascular:  Ear/Nose/Throat:   Skin:  Eyes:   Respiratory:   Gastroitestinal:   Genitourinary:  Hematology/Lymphatic:   Endocrine:  Musculoskeletal:   Allergy/Immunology:   Neurological:   Psychiatric:  Sleep:   The following represents the patient's updated allergies and side effects list: Allergies  Allergen Reactions  . Contrast Media [Iodinated Diagnostic Agents] Hives and Other (See Comments)    No reaction provided.  . Iohexol Hives     pt needs 13 hour prep--prev hives; added allergy 06/02/07 slg   . Sulfa Drugs Cross Reactors Hives  . Co-Trimoxazole Injection [Sulfamethoxazole-Trimethoprim] Rash    The neurologically relevant items on the patient's problem list were reviewed on today's visit.  Neurologic Examination  A problem focused neurological exam (12 or more points of the single system neurologic examination, vital signs counts as 1 point, cranial nerves count for 8 points) was performed.  Blood pressure 115/63, pulse 88, height '5\' 3"'$  (1.6 m), weight 151 lb 3.2 oz (68.584 kg).  General - Well nourished, well developed, in no apparent distress.  Ophthalmologic - Fundi not visualized due to eye movement.  Cardiovascular - Regular rate and rhythm with no murmur.  Mental Status -  Level of arousal and orientation to time, place, and person were intact. Language including expression, name, repetition, comprehension was  assessed and found intact.  Cranial Nerves II - XII - II - Visual field intact OU. III, IV, VI - Extraocular movements intact. V - Facial sensation intact bilaterally. VII - Facial movement intact bilaterally. VIII - Hearing & vestibular  intact bilaterally. X - Palate elevates symmetrically. XI - Chin turning & shoulder shrug intact bilaterally. XII - Tongue protrusion intact.  Motor Strength - The patient's strength was normal in all extremities and pronator drift was absent.  Bulk was normal and fasciculations were absent.   Motor Tone - Muscle tone was assessed at the neck and appendages and was normal.  Reflexes - The patient's reflexes were 1+ in all extremities and she had no pathological reflexes.  Sensory - Light touch, temperature/pinprick, vibration and proprioception, and Romberg testing were assessed and were normal.    Coordination - The patient had normal movements in the hands and feet with no ataxia or dysmetria.  Tremor was absent.  Gait and Station - The patient's transfers, posture, gait, station, and turns were observed as normal.  Data reviewed: I personally reviewed the images and agree with the radiology interpretations.  Dg Chest 2 View  04/18/2014 IMPRESSION: No active cardiopulmonary disease.   Ct Head (brain) Wo Contrast  04/16/2014 IMPRESSION: Subtle area of low attenuation in the left frontal lobe. This could represent focal ischemia or metastasis. Suggest MRI for further evaluation. No acute intracranial hemorrhage. No significant mass effect.   Mr Jeri Cos Wo Contrast  04/17/2014 IMPRESSION: Moderate-size acute hemorrhagic anterior left frontal lobe infarct with extension to the anterior left peri operculum region and subinsular region. Mild local mass effect. Small acute scattered right parietal lobe infarcts. Remote small right lenticular nucleus infarct. No intracranial enhancing lesion or bony destructive lesion to suggest the presence of intracranial metastatic disease. Paranasal sinus mucosal thickening/ partial opacification most notable maxillary sinuses, ethmoid sinus air cells and right sphenoid sinus.   US Renal  04/17/2014 IMPRESSION: Normal renal ultrasound. No  evidence of hydronephrosis. Decompressed bladder.   Mra Head/brain Wo Cm  04/18/2014 IMPRESSION: No large vessel occlusion. Decreased number of distal LEFT middle cerebral artery branches. LEFT frontal lobe hemorrhage versus enhancement partially imaged.    Carotid Doppler Bilateral: 1-39% ICA stenosis. Vertebral artery flow is antegrade.  2D Echocardiogram - Left ventricle: The cavity size was normal. Wall thickness was normal. Systolic function was normal. The estimated ejection fraction was in the range of 55% to 60%. Wall motion was normal; there were no regional wall motion abnormalities. Left ventricular diastolic function parameters were normal.  UE and LE venous Doppler - negative for DVT  EEG - This is an abnormal electroencephalogram secondary to slowing and sharp activity over the left frontotemporal region consistent with the patient's history of a frontal lobe hemorrhage. Also noted were periods of rhythmical bifrontal sharp activity that are concerning for electrographic and electroclinical seizure activity.   EEG repeat 08/01/14 - normal EEG in the waking state. No evidence of ictal or interictal discharges are seen.  TEE Normal LV size and systolic function, EF 36-46%. Normal regional wall motion. Normal RV size and systolic function. Trivial MR. Trivial TR, peak RV-RA gradient 37 mmHg. Trileaflet aortic valve with no AS or AI. Normal atrial sizes with no LAA thrombus. Catheter ends in right atrium, no clot or vegetation. Negative bubble study, no evidence for PFO or ASD. Normal caliber aorta, grade II plaque descending thoracic aorta.  Loop recorder - no afib so far.  Pan CT 01/06/15 - 1.  Interval stability of two small pleural-based right upper lobe pulmonary nodules. 2. No new sites of metastatic disease in the chest, abdomen or pelvis. 3. Mild-to-moderate centrilobular emphysema and diffuse bronchial wall thickening, suggesting COPD.  MRI  03/28/15 1. No acute intracranial abnormality. No evidence of new intracranial metastatic disease. Small focus of enhancement along the inner table of the right frontal bone within the region of the previously treated metastatic lesion appears slightly more conspicuous on today's exam; however, this is favored to be related to technique given its appearance across multiple prior studies. 2. Stable appearance of the remote left frontal infarct.   Component     Latest Ref Rng 04/18/2014  Cholesterol     0 - 200 mg/dL 173  Triglycerides     <150 mg/dL 116  HDL Cholesterol     >39 mg/dL 45  Total CHOL/HDL Ratio      3.8  VLDL     0 - 40 mg/dL 23  LDL (calc)     0 - 99 mg/dL 105 (H)  Hemoglobin A1C     4.8 - 5.6 % 6.1 (H)  Mean Plasma Glucose      128    Assessment: As you may recall, she is a 65 y.o. 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 Although hypercoagulable state due to malignancy is a possibility, however, after discuss with her oncologist Dr. Rogue Jury, it was felt less likely. Stroke work up including TTE, CUS, TEE, DVT not revealing, therefore Loop recorder placed. EEG showed bifrontal sharps, so vimpat was started. She was d/c to home with ASA and lipitor. Overtime, language much improved and now normal. Loop recorder no afib so far. However, her vimpat was d/c with unclear reason. She has no seizure activity. Repeat EEG showed normal EEG. Back to driving. Followed up with rad oncology and repeat MRI brain showed no brain metastasis. Continue maintenance chemo.   Plan:   continue ASA and lipitor for stroke prevention - continue to monitor loop recorder - follow up with oncologist regularly  - Follow up with your  primary care physician for stroke risk factor modification. Recommend maintain blood pressure goal <130/80, diabetes with hemoglobin A1c goal below 6.5% and lipids with LDL cholesterol goal below 70 mg/dL.  - check BP at home - follow up in one year.  I spent more than 25 minutes of face to face time with the patient. Greater than 50% of time was spent in counseling and coordination of care. We discussed recent MRI brain report, continued oncology follow up and loop recorder monitoring.   No orders of the defined types were placed in this encounter.    No orders of the defined types were placed in this encounter.    Patient Instructions  - continue ASA and lipitor for stroke prevention - continue to monitor loop recorder - follow up with oncologist regularly  - Follow up with your primary care physician for stroke risk factor modification. Recommend maintain blood pressure goal <130/80, diabetes with hemoglobin A1c goal below 6.5% and lipids with LDL cholesterol goal below 70 mg/dL.  - check BP at home - follow up in one year.    Rosalin Hawking, MD PhD [facility] [address] [place], [place] [address] [phone]  [phone]   

## 2015-05-12 ENCOUNTER — Encounter (HOSPITAL_COMMUNITY): Payer: Self-pay

## 2015-05-12 ENCOUNTER — Ambulatory Visit (HOSPITAL_COMMUNITY)
Admission: RE | Admit: 2015-05-12 | Discharge: 2015-05-12 | Disposition: A | Discharge status: Home / Self Care | Payer: Medicare Other | Source: Ambulatory Visit | Attending: Internal Medicine | Admitting: Internal Medicine

## 2015-05-12 DIAGNOSIS — C3491 Malignant neoplasm of unspecified part of right bronchus or lung: Secondary | ICD-10-CM

## 2015-05-12 DIAGNOSIS — I7090 Unspecified atherosclerosis: Secondary | ICD-10-CM | POA: Insufficient documentation

## 2015-05-12 DIAGNOSIS — K5900 Constipation, unspecified: Secondary | ICD-10-CM | POA: Insufficient documentation

## 2015-05-12 DIAGNOSIS — C3411 Malignant neoplasm of upper lobe, right bronchus or lung: Secondary | ICD-10-CM

## 2015-05-12 DIAGNOSIS — C3490 Malignant neoplasm of unspecified part of unspecified bronchus or lung: Secondary | ICD-10-CM | POA: Diagnosis not present

## 2015-05-12 DIAGNOSIS — J439 Emphysema, unspecified: Secondary | ICD-10-CM | POA: Diagnosis not present

## 2015-05-12 DIAGNOSIS — C786 Secondary malignant neoplasm of retroperitoneum and peritoneum: Secondary | ICD-10-CM | POA: Diagnosis not present

## 2015-05-12 DIAGNOSIS — C7931 Secondary malignant neoplasm of brain: Secondary | ICD-10-CM

## 2015-05-12 MED ORDER — IOPAMIDOL (ISOVUE-300) INJECTION 61%
100.0000 mL | Freq: Once | INTRAVENOUS | Status: AC | PRN
  Administered 2015-05-12: 100 mL via INTRAVENOUS

## 2015-05-15 ENCOUNTER — Ambulatory Visit (INDEPENDENT_AMBULATORY_CARE_PROVIDER_SITE_OTHER): Payer: Medicare Other | Admitting: *Deleted

## 2015-05-15 DIAGNOSIS — I639 Cerebral infarction, unspecified: Secondary | ICD-10-CM

## 2015-05-15 NOTE — Progress Notes (Signed)
[facility] Summary Report / Loop Recorder 

## 2015-05-16 ENCOUNTER — Encounter: Payer: Self-pay | Admitting: Oncology

## 2015-05-16 ENCOUNTER — Other Ambulatory Visit (HOSPITAL_BASED_OUTPATIENT_CLINIC_OR_DEPARTMENT_OTHER): Payer: Medicare Other

## 2015-05-16 ENCOUNTER — Encounter: Payer: Self-pay | Admitting: Internal Medicine

## 2015-05-16 ENCOUNTER — Telehealth: Payer: Self-pay | Admitting: Internal Medicine

## 2015-05-16 ENCOUNTER — Ambulatory Visit (HOSPITAL_BASED_OUTPATIENT_CLINIC_OR_DEPARTMENT_OTHER): Payer: Medicare Other | Admitting: Internal Medicine

## 2015-05-16 ENCOUNTER — Ambulatory Visit (HOSPITAL_BASED_OUTPATIENT_CLINIC_OR_DEPARTMENT_OTHER): Payer: Medicare Other

## 2015-05-16 VITALS — BP 122/53 | HR 87 | Temp 98.1°F | Resp 18 | Ht 63.0 in | Wt 147.7 lb

## 2015-05-16 DIAGNOSIS — C7931 Secondary malignant neoplasm of brain: Secondary | ICD-10-CM | POA: Diagnosis not present

## 2015-05-16 DIAGNOSIS — C3411 Malignant neoplasm of upper lobe, right bronchus or lung: Secondary | ICD-10-CM

## 2015-05-16 DIAGNOSIS — Z5111 Encounter for antineoplastic chemotherapy: Secondary | ICD-10-CM | POA: Diagnosis present

## 2015-05-16 DIAGNOSIS — C3491 Malignant neoplasm of unspecified part of right bronchus or lung: Secondary | ICD-10-CM

## 2015-05-16 DIAGNOSIS — C3410 Malignant neoplasm of upper lobe, unspecified bronchus or lung: Secondary | ICD-10-CM

## 2015-05-16 DIAGNOSIS — C3490 Malignant neoplasm of unspecified part of unspecified bronchus or lung: Secondary | ICD-10-CM

## 2015-05-16 LAB — COMPREHENSIVE METABOLIC PANEL
ALBUMIN: 3.5 g/dL (ref 3.5–5.0)
ALK PHOS: 84 U/L (ref 40–150)
ALT: 11 U/L (ref 0–55)
AST: 16 U/L (ref 5–34)
Anion Gap: 8 mEq/L (ref 3–11)
BUN: 10.9 mg/dL (ref 7.0–26.0)
CO2: 28 mEq/L (ref 22–29)
Calcium: 9.6 mg/dL (ref 8.4–10.4)
Chloride: 105 mEq/L (ref 98–109)
Creatinine: 0.9 mg/dL (ref 0.6–1.1)
EGFR: 71 mL/min/{1.73_m2} — ABNORMAL LOW (ref >90)
GLUCOSE: 131 mg/dL (ref 70–140)
POTASSIUM: 4.1 meq/L (ref 3.5–5.1)
SODIUM: 141 meq/L (ref 136–145)
TOTAL PROTEIN: 7 g/dL (ref 6.4–8.3)
Total Bilirubin: 0.32 mg/dL (ref 0.20–1.20)

## 2015-05-16 LAB — CBC WITH DIFFERENTIAL/PLATELET
BASO%: 1 % (ref 0.0–2.0)
Basophils Absolute: 0.1 10*3/uL (ref 0.0–0.1)
EOS%: 0.6 % (ref 0.0–7.0)
Eosinophils Absolute: 0.1 10*3/uL (ref 0.0–0.5)
HEMATOCRIT: 37.3 % (ref 34.8–46.6)
HGB: 12.2 g/dL (ref 11.6–15.9)
LYMPH#: 2.3 10*3/uL (ref 0.9–3.3)
LYMPH%: 23.3 % (ref 14.0–49.7)
MCH: 29.9 pg (ref 25.1–34.0)
MCHC: 32.7 g/dL (ref 31.5–36.0)
MCV: 91.3 fL (ref 79.5–101.0)
MONO#: 1.1 10*3/uL — AB (ref 0.1–0.9)
MONO%: 11.8 % (ref 0.0–14.0)
NEUT%: 63.3 % (ref 38.4–76.8)
NEUTROS ABS: 6.2 10*3/uL (ref 1.5–6.5)
Platelets: 415 10*3/uL — ABNORMAL HIGH (ref 145–400)
RBC: 4.09 10*6/uL (ref 3.70–5.45)
RDW: 16 % — ABNORMAL HIGH (ref 11.2–14.5)
WBC: 9.7 10*3/uL (ref 3.9–10.3)

## 2015-05-16 LAB — RESEARCH LABS

## 2015-05-16 MED ORDER — MORPHINE SULFATE ER 60 MG PO TBCR: 60.0000 mg | EXTENDED_RELEASE_TABLET | Freq: Two times a day (BID) | ORAL | Status: DC

## 2015-05-16 MED ORDER — SODIUM CHLORIDE 0.9 % IV SOLN
Freq: Once | INTRAVENOUS | Status: AC
  Administered 2015-05-16: 10:00:00 via INTRAVENOUS

## 2015-05-16 MED ORDER — SODIUM CHLORIDE 0.9 % IJ SOLN
10.0000 mL | INTRAMUSCULAR | Status: DC | PRN
  Administered 2015-05-16: 10 mL
  Filled 2015-05-16: qty 10

## 2015-05-16 MED ORDER — HEPARIN SOD (PORK) LOCK FLUSH 100 UNIT/ML IV SOLN
500.0000 [IU] | Freq: Once | INTRAVENOUS | Status: AC | PRN
  Administered 2015-05-16: 500 [IU]
  Filled 2015-05-16: qty 5

## 2015-05-16 MED ORDER — SODIUM CHLORIDE 0.9 % IV SOLN
Freq: Once | INTRAVENOUS | Status: AC
  Administered 2015-05-16: 10:00:00 via INTRAVENOUS
  Filled 2015-05-16: qty 4

## 2015-05-16 MED ORDER — SODIUM CHLORIDE 0.9 % IV SOLN
500.0000 mg/m2 | Freq: Once | INTRAVENOUS | Status: AC
  Administered 2015-05-16: 900 mg via INTRAVENOUS
  Filled 2015-05-16: qty 36

## 2015-05-16 NOTE — Telephone Encounter (Signed)
Gave pt appt & avs °

## 2015-05-16 NOTE — Progress Notes (Signed)
05/16/15 - [facility]MS [id number] study - Questionnaires (PRO's) - Patient into cancer center for routine visit.  Patient given PRO's upon arrival to the cancer center.  Patient completed PRO's ([id number] first and then the LCSS booklet) before any study procedures were done.  The PRO's were checked for completeness.  The patient then had her research labs drawn.  The patient was thanked for her continued support in this clinical trial. Remer Macho 05/16/15 - 9:00 am

## 2015-05-16 NOTE — Progress Notes (Signed)
[place] [phone]   [phone]  OFFICE PROGRESS NOTE  [name], MD [place] [address]  Diagnosis:  Metastatic non-small cell lung cancer initially diagnosed with locally advanced stage IIIB with right Pancoast tumor involving the vertebral body as well as the foraminal canal invasion with spinal cord compression in October of 2007.   Prior Therapy:  1. Status post concurrent chemoradiation with weekly carboplatin and paclitaxel, last dose was given November 18, 2005. 2. 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 3. Status post gamma knife radiotherapy to a solitary brain lesion located in the superior frontal area of the brain at [facility] in April of 2008. 4. Status post palliative radiotherapy to the lateral abdominal wall metastatic lesion under the care of Dr. Lisbeth Renshaw, completed March of 2009. 5. Status post 6 cycles of systemic chemotherapy with carboplatin and Alimta. Last dose was given July 26, 2007 with disease stabilization. 6. Gamma knife stereotactic radiotherapy to 2 brain lesions one involving the right frontal dural based as well as right parietal lesion performed on 05/07/2012 under the care of Dr. Vallarie Mare at [facility].  Current Therapy:  Maintenance chemotherapy with Alimta 500 mg per meter squared given every 3 weeks. The patient is status post 133 cycles.   CHEMOTHERAPY INTENT: Palliative/maintenance  CURRENT # OF CHEMOTHERAPY CYCLES: 134 CURRENT ANTIEMETICS: Zofran, dexamethasone and Compazine  CURRENT SMOKING STATUS: Quit smoking 10/07/2005  ORAL CHEMOTHERAPY AND CONSENT: None  CURRENT BISPHOSPHONATES USE: None  PAIN MANAGEMENT: 0/10 currently on morphine  NARCOTICS INDUCED CONSTIPATION: No constipation  LIVING WILL AND CODE STATUS: No CODE BLUE  INTERVAL HISTORY: Danielle [name] 65 y.o. female  returns to the clinic today for followup visit accompanied by her sister. The patient is feeling fine today with no specific complaints.  She has mild chest congestion. Several family members had flulike symptoms recently. She denied having any significant fever or chills. She is tolerating her maintenance treatment with single agent Alimta status post 133 cycles fairly well with no significant adverse effects. The patient denied having any chest pain, shortness of breath, or hemoptysis. No nausea or vomiting. The patient denied having any weight loss or night sweats. She had repeat CT scan of the chest, abdomen and pelvis performed recently and she is here for evaluation and discussion of her scan results.  MEDICAL HISTORY: Past Medical History  Diagnosis Date  . Hypertension   . Anemia   . H/O: pneumonia   . History of tobacco abuse quit 10/08    on nicotine patch  . Fibromyalgia   . Hypokalemia   . DJD (degenerative joint disease), cervical   . Thrush 12/11/2010  . CVA (cerebral vascular accident) ([place]) 04/2014    pt states had 2 cva's within 2 wks  . Lung cancer ([place]) dx'd 09/2005    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 had chemo and radiation for lung ca    ALLERGIES:  is allergic to contrast media; iohexol; sulfa drugs cross reactors; and co-trimoxazole injection.  MEDICATIONS:  Current Outpatient Prescriptions  Medication Sig Dispense Refill  . ALPRAZolam (XANAX) 0.25 MG tablet Take 1 tablet (0.25 mg total) by mouth at bedtime as needed. for sleep 30 tablet 0  . amLODipine (NORVASC) 10 MG tablet TAKE 1 TABLET (10 MG TOTAL) BY MOUTH DAILY. 90 tablet 1  . aspirin EC 325 MG tablet TAKE 1 TABLET (  325 MG TOTAL) BY MOUTH DAILY. 30 tablet 0  . atorvastatin (LIPITOR) 40 MG tablet TAKE 1 TABLET BY MOUTH EVERY DAY AT 6 PM 90 tablet 2  . fluticasone (FLONASE) 50 MCG/ACT nasal spray USE 1-2 SPRAYS IN EACH NOSTRIL DAILY (Patient taking differently: USE 1-2 SPRAYS IN EACH NOSTRIL DAILY PRN  CONGESTION) 16 g 11  . folic acid (FOLVITE) 1 MG tablet TAKE 1 TABLET BY MOUTH DAILY. 90 tablet 1  . furosemide (LASIX) 20 MG tablet TAKE 1 TABLET (20 MG TOTAL) BY MOUTH DAILY AS NEEDED FOR EDEMA. 20 tablet 0  . lidocaine-prilocaine (EMLA) cream Apply to Port-A-Cath as directed 30 g 1  . lisinopril (PRINIVIL,ZESTRIL) 20 MG tablet TAKE 2 TABLETS (40 MG TOTAL) BY MOUTH DAILY. 60 tablet 5  . morphine (MS CONTIN) 60 MG 12 hr tablet Take 1 tablet (60 mg total) by mouth 2 (two) times daily. 60 tablet 0  . morphine (MSIR) 30 MG tablet Take 1 tablet (30 mg total) by mouth every 4 (four) hours as needed (breakthrough pain). 60 tablet 0  . Olopatadine HCl 0.2 % SOLN Apply 1 drop to eye daily. 2.5 mL 11  . ondansetron (ZOFRAN) 8 MG tablet TAKE 1 TABLET (8 MG TOTAL) BY MOUTH 2 (TWO) TIMES DAILY AS NEEDED FOR NAUSEA OR VOMITING. 30 tablet 2  . predniSONE (DELTASONE) 50 MG tablet Take 1 tablet (50 mg total) by mouth See admin instructions. Take 1 tablet (50 mg) 13 hours before CT scan, 7 hours before CT scan and 1 hour before CT scan (every 9 weeks) 3 tablet 1   No current facility-administered medications for this visit.    SURGICAL HISTORY:  Past Surgical History  Procedure Laterality Date  . Cervical laminectomy  1995  . Other surgical history  2008    Gamma knife surgery to remove brain met   . 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 Knee surgery  1990    Left x 2  . Portacath placement  -[name]    TIP IN CAVOATRIAL JUNCTION  . Loop recorder implant N/A 04/19/2014    Procedure: LOOP RECORDER IMPLANT;  Surgeon: Thompson Grayer, MD;  Location[facility] C[facility] LAB;  Service: Cardiovascular;  Laterality: N/A;  . Tee without cardioversion N/A 04/19/2014    Procedure: TRANSESOPHAGEAL ECHOCARDIOGRAM (TEE);  Surgeon: Larey Dresser, MD;  Location: [facility];  Service: Cardiovascular;  Laterality: N/A;    REVIEW OF SYSTEMS:  Constitutional: negative Eyes: negative Ears, nose,  mouth, throat, and face: negative Respiratory: negative Cardiovascular: negative Gastrointestinal: negative Genitourinary:negative Integument/breast: negative Hematologic/lymphatic: negative Musculoskeletal:negative Neurological: negative Behavioral/Psych: negative Endocrine: negative Allergic/Immunologic: negative   PHYSICAL EXAMINATION: General appearance: alert, cooperative and no distress Head: Normocephalic, without obvious abnormality, atraumatic Neck: no adenopathy, no JVD, supple, symmetrical, trachea midline and thyroid not enlarged, symmetric, no tenderness/mass/nodules Lymph nodes: Cervical, supraclavicular, and axillary nodes normal. Resp: clear to auscultation bilaterally Back: symmetric, no curvature. ROM normal. No CVA tenderness. Cardio: regular rate and rhythm, S1, S2 normal, no murmur, click, rub or gallop GI: soft, non-tender; bowel sounds normal; no masses,  no organomegaly Extremities: extremities normal, atraumatic, no cyanosis or edema Neurologic: Alert and oriented X 3, normal strength and tone. Normal symmetric reflexes. Normal coordination and gait  ECOG PERFORMANCE STATUS: 0 - Asymptomatic  Blood pressure 122/53, pulse 87, temperature 98.1 F (36.7 C), temperature source Oral, resp. rate 18, height _0  (1.6 m), weight 147 lb 11.2 oz (66.996 kg),  SpO2 98 %.  LABORATORY DATA: Lab Results  Component Value Date   WBC 9.7 05/16/2015   HGB 12.2 05/16/2015   HCT 37.3 05/16/2015   MCV 91.3 05/16/2015   PLT 415* 05/16/2015      Chemistry      Component Value Date/Time   NA 141 04/25/2015 0903   NA 139 01/26/2015 0818   K 4.5 04/25/2015 0903   K 4.1 01/26/2015 0818   CL 103 01/26/2015 0818   CL 106 06/30/2012 1004   CO2 28 04/25/2015 0903   CO2 28 01/26/2015 0818   BUN 15.1 04/25/2015 0903   BUN 13 01/26/2015 0818   CREATININE 0.9 04/25/2015 0903   CREATININE 0.93 01/26/2015 0818      Component Value Date/Time   CALCIUM 9.6 04/25/2015 0903    CALCIUM 9.0 01/26/2015 0818   ALKPHOS 84 04/25/2015 0903   ALKPHOS 79 01/26/2015 0818   AST 24 04/25/2015 0903   AST 18 01/26/2015 0818   ALT 14 04/25/2015 0903   ALT 15 01/26/2015 0818   BILITOT <0.30 04/25/2015 0903   BILITOT 0.4 01/26/2015 0818       RADIOGRAPHIC STUDIES: Ct Chest W Contrast  05/12/2015  CLINICAL DATA:  Lung cancer to the spine and omentum. Ongoing chemotherapy. EXAM: CT CHEST, ABDOMEN, AND PELVIS WITH CONTRAST TECHNIQUE: Multidetector CT imaging of the chest, abdomen and pelvis was performed following the standard protocol during bolus administration of intravenous contrast. CONTRAST:  100 cc Omnipaque 300 COMPARISON:  Multiple exams, including 01/06/2015 FINDINGS: CT CHEST FINDINGS Mediastinum/Nodes: Right IJ Port-A-Cath tip cavoatrial junction. Coronary, aortic arch, and branch vessel atherosclerotic vascular disease. Loop recorder on the left. No pathologic thoracic adenopathy. Lungs/Pleura: Biapical pleural parenchymal scarring not appreciably changed from prior, with a 3.0 by 1.4 cm nodular component medially at the right lung apex. This is probably best compared in short axis given its orientation, and was previously 1.4 cm as well. Severe emphysema. Secondary pulmonary lobular septal thickening at the right lung apex. Triangular bandlike density anteriorly in the right lower lobe measures a maximum of 0.9 by 1.2 cm, previously the same by my measurements, and by my measurements previously the same on 03/16/2012. Bilateral airway thickening. Musculoskeletal: Vertebra plana at T3 with vertebral augmentations at T4 and T7, no change from prior. CT ABDOMEN PELVIS FINDINGS Hepatobiliary: Mild extrahepatic biliary dilatation at 8 mm. By my measurement this was previously 6 mm. Pancreas: Unremarkable Spleen: Unremarkable Adrenals/Urinary Tract: Adrenal glands normal. Borderline accentuated enhancement of the right collecting system wall, but no filling defect, and this appearance  is chronically stable. Stomach/Bowel: Mild prominence of stool in the colon. Vascular/Lymphatic: Aortoiliac atherosclerotic vascular disease. No pathologic adenopathy identified. Reproductive: Unremarkable Other: No supplemental non-categorized findings. 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 IMPRESSION: 1. No findings of active malignancy currently. Nodularity medially at the right lung apex and triangular scar-like nodularity anteriorly in the right upper lobe are stable over the last 3 years and hands likely benign. 2. Atherosclerosis. 3. Vertebro plana at T3 with vertebral augmentations at T4 and T7. 4. Mild extrahepatic biliary dilatation at 8 mm, significance uncertain. 5.  Prominent stool throughout the colon favors constipation. 6. 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 7. Emphysema. 8. Airway thickening is present, suggesting bronchitis or reactive airways disease. Electronically Signed   By: Van Clines M.D.   On: 05/12/2015 10:46   Ct Abdomen Pelvis W Contrast  05/12/2015  CLINICAL DATA:  Lung cancer to  the spine and omentum. Ongoing chemotherapy. EXAM: CT CHEST, ABDOMEN, AND PELVIS WITH CONTRAST TECHNIQUE: Multidetector CT imaging of the chest, abdomen and pelvis was performed following the standard protocol during bolus administration of intravenous contrast. CONTRAST:  100 cc Omnipaque 300 COMPARISON:  Multiple exams, including 01/06/2015 FINDINGS: CT CHEST FINDINGS Mediastinum/Nodes: Right IJ Port-A-Cath tip cavoatrial junction. Coronary, aortic arch, and branch vessel atherosclerotic vascular disease. Loop recorder on the left. No pathologic thoracic adenopathy. Lungs/Pleura: Biapical pleural parenchymal scarring not appreciably changed from prior, with a 3.0 by 1.4 cm nodular component medially at the right lung apex. This is probably best compared in short axis given its orientation, and was previously 1.4 cm as well.  Severe emphysema. Secondary pulmonary lobular septal thickening at the right lung apex. Triangular bandlike density anteriorly in the right lower lobe measures a maximum of 0.9 by 1.2 cm, previously the same by my measurements, and by my measurements previously the same on 03/16/2012. Bilateral airway thickening. Musculoskeletal: Vertebra plana at T3 with vertebral augmentations at T4 and T7, no change from prior. CT ABDOMEN PELVIS FINDINGS Hepatobiliary: Mild extrahepatic biliary dilatation at 8 mm. By my measurement this was previously 6 mm. Pancreas: Unremarkable Spleen: Unremarkable Adrenals/Urinary Tract: Adrenal glands normal. Borderline accentuated enhancement of the right collecting system wall, but no filling defect, and this appearance is chronically stable. Stomach/Bowel: Mild prominence of stool in the colon. Vascular/Lymphatic: Aortoiliac atherosclerotic vascular disease. No pathologic adenopathy identified. Reproductive: Unremarkable Other: No supplemental non-categorized findings. 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 IMPRESSION: 1. No findings of active malignancy currently. Nodularity medially at the right lung apex and triangular scar-like nodularity anteriorly in the right upper lobe are stable over the last 3 years and hands likely benign. 2. Atherosclerosis. 3. Vertebro plana at T3 with vertebral augmentations at T4 and T7. 4. Mild extrahepatic biliary dilatation at 8 mm, significance uncertain. 5.  Prominent stool throughout the colon favors constipation. 6. 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 7. Emphysema. 8. Airway thickening is present, suggesting bronchitis or reactive airways disease. Electronically Signed   By: Van Clines M.D.   On: 05/12/2015 10:46   ASSESSMENT AND PLAN: This is a very pleasant 65 years old white female with metastatic non-small cell lung cancer currently undergoing maintenance  chemotherapy with single agent Alimta status post 133 cycles. The patient is tolerating her treatment fairly well with no significant adverse effects.  The recent CT scan of the chest, abdomen and pelvis showed no evidence for disease recurrence or progression. I discussed the scan results with the patient and her sister. I recommended for her to continue with her maintenance chemotherapy and the patient will receive cycle #134 today. For pain management, she will continue on her current pain medication with MS Contin and MS IR. She was given a refill for MS Contin today. The patient would come back for follow-up visit in 3 weeks for reevaluation before starting cycle #135 of her maintenance therapy. She was advised to call immediately if she has any concerning symptoms in the interval. The patient voices understanding of current disease status and treatment options and is in agreement with the current care plan.  All questions were answered. The patient knows to call the clinic with any problems, questions or concerns. We can certainly see the patient much sooner if necessary.  Disclaimer: This note was dictated with voice recognition software. Similar sounding words can inadvertently be transcribed and may not be corrected upon review.

## 2015-05-16 NOTE — Patient Instructions (Signed)
[place] Discharge Instructions for Patients Receiving Chemotherapy  Today you received the following chemotherapy agent: Alimta   To help prevent nausea and vomiting after your treatment, we encourage you to take your nausea medication as prescribed.    If you develop nausea and vomiting that is not controlled by your nausea medication, call the clinic.   BELOW ARE SYMPTOMS THAT SHOULD BE REPORTED IMMEDIATELY:  *FEVER GREATER THAN 100.5 F  *CHILLS WITH OR WITHOUT FEVER  NAUSEA AND VOMITING THAT IS NOT CONTROLLED WITH YOUR NAUSEA MEDICATION  *UNUSUAL SHORTNESS OF BREATH  *UNUSUAL BRUISING OR BLEEDING  TENDERNESS IN MOUTH AND THROAT WITH OR WITHOUT PRESENCE OF ULCERS  *URINARY PROBLEMS  *BOWEL PROBLEMS  UNUSUAL RASH Items with * indicate a potential emergency and should be followed up as soon as possible.  Feel free to call the clinic you have any questions or concerns. The clinic phone number is [phone].  Please show the [facility] at check-in to the Emergency Department and triage nurse.

## 2015-05-22 ENCOUNTER — Other Ambulatory Visit: Payer: Self-pay | Admitting: Internal Medicine

## 2015-05-24 DIAGNOSIS — H2513 Age-related nuclear cataract, bilateral: Secondary | ICD-10-CM | POA: Diagnosis not present

## 2015-05-24 DIAGNOSIS — H25011 Cortical age-related cataract, right eye: Secondary | ICD-10-CM | POA: Diagnosis not present

## 2015-05-24 DIAGNOSIS — H43811 Vitreous degeneration, right eye: Secondary | ICD-10-CM | POA: Diagnosis not present

## 2015-05-24 DIAGNOSIS — H04123 Dry eye syndrome of bilateral lacrimal glands: Secondary | ICD-10-CM | POA: Diagnosis not present

## 2015-05-24 LAB — HM DIABETES EYE EXAM

## 2015-05-30 ENCOUNTER — Other Ambulatory Visit: Payer: Self-pay | Admitting: Medical Oncology

## 2015-05-30 DIAGNOSIS — C3400 Malignant neoplasm of unspecified main bronchus: Secondary | ICD-10-CM

## 2015-05-30 DIAGNOSIS — C7931 Secondary malignant neoplasm of brain: Secondary | ICD-10-CM

## 2015-05-30 MED ORDER — ALPRAZOLAM 0.25 MG PO TABS: 0.2500 mg | ORAL_TABLET | Freq: Every evening | ORAL | Status: DC | PRN

## 2015-05-30 MED ORDER — ONDANSETRON HCL 8 MG PO TABS: ORAL_TABLET | ORAL | Status: DC

## 2015-05-30 NOTE — Progress Notes (Signed)
Pt requested refills zofran and xanax-sent to [facility].

## 2015-05-30 NOTE — Addendum Note (Signed)
Addended by: Ardeen Garland on: 05/30/2015 10:23 AM   Modules accepted: Orders

## 2015-06-02 ENCOUNTER — Encounter: Payer: Self-pay | Admitting: Pharmacist

## 2015-06-03 LAB — CUP PACEART REMOTE DEVICE CHECK: Date Time Interrogation Session: 20170407203820

## 2015-06-03 NOTE — Progress Notes (Signed)
[facility] summary report received. Battery status OK. Normal device function. No new symptom episodes, tachy episodes, brady, or pause episodes. No new AF episodes. Monthly summary reports and ROV/PRN 

## 2015-06-06 ENCOUNTER — Ambulatory Visit (HOSPITAL_BASED_OUTPATIENT_CLINIC_OR_DEPARTMENT_OTHER): Payer: Medicare Other | Admitting: Internal Medicine

## 2015-06-06 ENCOUNTER — Ambulatory Visit (HOSPITAL_BASED_OUTPATIENT_CLINIC_OR_DEPARTMENT_OTHER): Payer: Medicare Other

## 2015-06-06 ENCOUNTER — Telehealth: Payer: Self-pay | Admitting: Internal Medicine

## 2015-06-06 ENCOUNTER — Other Ambulatory Visit (HOSPITAL_BASED_OUTPATIENT_CLINIC_OR_DEPARTMENT_OTHER): Payer: Medicare Other

## 2015-06-06 ENCOUNTER — Encounter: Payer: Self-pay | Admitting: Internal Medicine

## 2015-06-06 VITALS — BP 128/62 | HR 74 | Temp 97.8°F | Resp 20 | Ht 63.0 in | Wt 147.0 lb

## 2015-06-06 DIAGNOSIS — C7931 Secondary malignant neoplasm of brain: Secondary | ICD-10-CM | POA: Diagnosis not present

## 2015-06-06 DIAGNOSIS — C3411 Malignant neoplasm of upper lobe, right bronchus or lung: Secondary | ICD-10-CM

## 2015-06-06 DIAGNOSIS — Z5111 Encounter for antineoplastic chemotherapy: Secondary | ICD-10-CM

## 2015-06-06 DIAGNOSIS — C3410 Malignant neoplasm of upper lobe, unspecified bronchus or lung: Secondary | ICD-10-CM

## 2015-06-06 DIAGNOSIS — C3490 Malignant neoplasm of unspecified part of unspecified bronchus or lung: Secondary | ICD-10-CM

## 2015-06-06 LAB — CBC WITH DIFFERENTIAL/PLATELET
BASO%: 0.2 % (ref 0.0–2.0)
BASOS ABS: 0 10*3/uL (ref 0.0–0.1)
EOS%: 1 % (ref 0.0–7.0)
Eosinophils Absolute: 0.1 10*3/uL (ref 0.0–0.5)
HCT: 36.6 % (ref 34.8–46.6)
HEMOGLOBIN: 11.8 g/dL (ref 11.6–15.9)
LYMPH%: 20.8 % (ref 14.0–49.7)
MCH: 30.1 pg (ref 25.1–34.0)
MCHC: 32.2 g/dL (ref 31.5–36.0)
MCV: 93.4 fL (ref 79.5–101.0)
MONO#: 1.3 10*3/uL — ABNORMAL HIGH (ref 0.1–0.9)
MONO%: 15.9 % — AB (ref 0.0–14.0)
NEUT#: 5.1 10*3/uL (ref 1.5–6.5)
NEUT%: 62.1 % (ref 38.4–76.8)
Platelets: 327 10*3/uL (ref 145–400)
RBC: 3.92 10*6/uL (ref 3.70–5.45)
RDW: 14.9 % — AB (ref 11.2–14.5)
WBC: 8.2 10*3/uL (ref 3.9–10.3)
lymph#: 1.7 10*3/uL (ref 0.9–3.3)

## 2015-06-06 LAB — COMPREHENSIVE METABOLIC PANEL
ALT: 11 U/L (ref 0–55)
AST: 17 U/L (ref 5–34)
Albumin: 3.4 g/dL — ABNORMAL LOW (ref 3.5–5.0)
Alkaline Phosphatase: 86 U/L (ref 40–150)
Anion Gap: 8 mEq/L (ref 3–11)
BUN: 17.6 mg/dL (ref 7.0–26.0)
CO2: 27 meq/L (ref 22–29)
Calcium: 9.7 mg/dL (ref 8.4–10.4)
Chloride: 107 mEq/L (ref 98–109)
Creatinine: 1 mg/dL (ref 0.6–1.1)
EGFR: 59 mL/min/{1.73_m2} — AB (ref >90)
GLUCOSE: 133 mg/dL (ref 70–140)
POTASSIUM: 4.6 meq/L (ref 3.5–5.1)
SODIUM: 142 meq/L (ref 136–145)
Total Bilirubin: 0.33 mg/dL (ref 0.20–1.20)
Total Protein: 7.1 g/dL (ref 6.4–8.3)

## 2015-06-06 MED ORDER — CYANOCOBALAMIN 1000 MCG/ML IJ SOLN
INTRAMUSCULAR | Status: AC
  Filled 2015-06-06: qty 1

## 2015-06-06 MED ORDER — MORPHINE SULFATE 30 MG PO TABS: 30.0000 mg | ORAL_TABLET | ORAL | Status: DC | PRN

## 2015-06-06 MED ORDER — HEPARIN SOD (PORK) LOCK FLUSH 100 UNIT/ML IV SOLN
500.0000 [IU] | Freq: Once | INTRAVENOUS | Status: AC | PRN
  Administered 2015-06-06: 500 [IU]
  Filled 2015-06-06: qty 5

## 2015-06-06 MED ORDER — CYANOCOBALAMIN 1000 MCG/ML IJ SOLN
1000.0000 ug | Freq: Once | INTRAMUSCULAR | Status: AC
  Administered 2015-06-06: 1000 ug via INTRAMUSCULAR

## 2015-06-06 MED ORDER — SODIUM CHLORIDE 0.9 % IV SOLN
Freq: Once | INTRAVENOUS | Status: AC
  Administered 2015-06-06: 13:00:00 via INTRAVENOUS

## 2015-06-06 MED ORDER — SODIUM CHLORIDE 0.9 % IV SOLN
500.0000 mg/m2 | Freq: Once | INTRAVENOUS | Status: AC
  Administered 2015-06-06: 900 mg via INTRAVENOUS
  Filled 2015-06-06: qty 32

## 2015-06-06 MED ORDER — SODIUM CHLORIDE 0.9 % IV SOLN
Freq: Once | INTRAVENOUS | Status: AC
  Administered 2015-06-06: 14:00:00 via INTRAVENOUS
  Filled 2015-06-06: qty 4

## 2015-06-06 MED ORDER — SODIUM CHLORIDE 0.9 % IJ SOLN
10.0000 mL | INTRAMUSCULAR | Status: DC | PRN
  Administered 2015-06-06: 10 mL
  Filled 2015-06-06: qty 10

## 2015-06-06 NOTE — Telephone Encounter (Signed)
Gave and printed appt sched and avs for pt for June thru Aug °

## 2015-06-06 NOTE — Progress Notes (Signed)
[place] [phone]   [phone]  OFFICE PROGRESS NOTE  [name], MD [place] [address]  Diagnosis:  Metastatic non-small cell lung cancer initially diagnosed with locally advanced stage IIIB with right Pancoast tumor involving the vertebral body as well as the foraminal canal invasion with spinal cord compression in October of 2007.   Prior Therapy:  1. Status post concurrent chemoradiation with weekly carboplatin and paclitaxel, last dose was given November 18, 2005. 2. 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 3. Status post gamma knife radiotherapy to a solitary brain lesion located in the superior frontal area of the brain at [facility] in April of 2008. 4. Status post palliative radiotherapy to the lateral abdominal wall metastatic lesion under the care of Dr. Lisbeth Renshaw, completed March of 2009. 5. Status post 6 cycles of systemic chemotherapy with carboplatin and Alimta. Last dose was given July 26, 2007 with disease stabilization. 6. Gamma knife stereotactic radiotherapy to 2 brain lesions one involving the right frontal dural based as well as right parietal lesion performed on 05/07/2012 under the care of Dr. Vallarie Mare at [facility].  Current Therapy:  Maintenance chemotherapy with Alimta 500 mg per meter squared given every 3 weeks. The patient is status post 134 cycles.   CHEMOTHERAPY INTENT: Palliative/maintenance  CURRENT # OF CHEMOTHERAPY CYCLES: 135 CURRENT ANTIEMETICS: Zofran, dexamethasone and Compazine  CURRENT SMOKING STATUS: Quit smoking 10/07/2005  ORAL CHEMOTHERAPY AND CONSENT: None  CURRENT BISPHOSPHONATES USE: None  PAIN MANAGEMENT: 0/10 currently on morphine  NARCOTICS INDUCED CONSTIPATION: No constipation  LIVING WILL AND CODE STATUS: No CODE BLUE  INTERVAL HISTORY: Danielle [name] 65 y.o. female  returns to the clinic today for followup visit accompanied by her sister. The patient is feeling fine today with no specific complaints. She denied having any significant fever or chills. She is tolerating her maintenance treatment with single agent Alimta status post 134 cycles fairly well with no significant adverse effects. The patient denied having any chest pain, shortness of breath, or hemoptysis. No nausea or vomiting. The patient denied having any weight loss or night sweats. She is here today to start cycle 135.  MEDICAL HISTORY: Past Medical History  Diagnosis Date  . Hypertension   . Anemia   . H/O: pneumonia   . History of tobacco abuse quit 10/08    on nicotine patch  . Fibromyalgia   . Hypokalemia   . DJD (degenerative joint disease), cervical   . Thrush 12/11/2010  . CVA (cerebral vascular accident) ([place]) 04/2014    pt states had 2 cva's within 2 wks  . Lung cancer ([place]) dx'd 09/2005    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 had chemo and radiation for lung ca    ALLERGIES:  is allergic to contrast media; iohexol; sulfa drugs cross reactors; and co-trimoxazole injection.  MEDICATIONS:  Current Outpatient Prescriptions  Medication Sig Dispense Refill  . ALPRAZolam (XANAX) 0.25 MG tablet Take 1 tablet (0.25 mg total) by mouth at bedtime as needed. for sleep 30 tablet 0  . amLODipine (NORVASC) 10 MG tablet TAKE 1 TABLET (10 MG TOTAL) BY MOUTH DAILY. 90 tablet 1  . aspirin EC 325 MG tablet TAKE 1 TABLET (325 MG TOTAL) BY MOUTH DAILY. 30 tablet 0  . atorvastatin (LIPITOR) 40 MG tablet TAKE 1 TABLET BY MOUTH EVERY DAY AT 6 PM 90 tablet 2  .  fluticasone (FLONASE) 50 MCG/ACT nasal spray USE 1-2 SPRAYS IN EACH NOSTRIL DAILY (Patient taking differently: USE 1-2 SPRAYS IN EACH NOSTRIL DAILY PRN CONGESTION) 16 g 11  . folic acid (FOLVITE) 1 MG tablet TAKE 1 TABLET BY MOUTH DAILY. 90 tablet 1  . furosemide (LASIX) 20 MG tablet TAKE 1 TABLET (20 MG TOTAL) BY MOUTH DAILY AS NEEDED FOR  EDEMA. 20 tablet 0  . lidocaine-prilocaine (EMLA) cream Apply to Port-A-Cath as directed 30 g 1  . lisinopril (PRINIVIL,ZESTRIL) 20 MG tablet TAKE 2 TABLETS (40 MG TOTAL) BY MOUTH DAILY. 60 tablet 5  . morphine (MS CONTIN) 60 MG 12 hr tablet Take 1 tablet (60 mg total) by mouth 2 (two) times daily. 60 tablet 0  . morphine (MSIR) 30 MG tablet Take 1 tablet (30 mg total) by mouth every 4 (four) hours as needed (breakthrough pain). 60 tablet 0  . Olopatadine HCl 0.2 % SOLN Apply 1 drop to eye daily. 2.5 mL 11  . ondansetron (ZOFRAN) 8 MG tablet TAKE 1 TABLET (8 MG TOTAL) BY MOUTH 2 (TWO) TIMES DAILY AS NEEDED FOR NAUSEA OR VOMITING. 30 tablet 2  . predniSONE (DELTASONE) 50 MG tablet Take 1 tablet (50 mg total) by mouth See admin instructions. Take 1 tablet (50 mg) 13 hours before CT scan, 7 hours before CT scan and 1 hour before CT scan (every 9 weeks) 3 tablet 1   No current facility-administered medications for this visit.    SURGICAL HISTORY:  Past Surgical History  Procedure Laterality Date  . Cervical laminectomy  1995  . Other surgical history  2008    Gamma knife surgery to remove brain met   . 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 Knee surgery  1990    Left x 2  . Portacath placement  -[name]    TIP IN CAVOATRIAL JUNCTION  . Loop recorder implant N/A 04/19/2014    Procedure: LOOP RECORDER IMPLANT;  Surgeon: Thompson Grayer, MD;  Location[facility] CATH LAB;  Service: Cardiovascular;  Laterality: N/A;  . Tee without cardioversion N/A 04/19/2014    Procedure: TRANSESOPHAGEAL ECHOCARDIOGRAM (TEE);  Surgeon: Larey Dresser, MD;  Location: [facility] ENDOSCOPY;  Service: Cardiovascular;  Laterality: N/A;    REVIEW OF SYSTEMS:  A comprehensive review of systems was negative.   PHYSICAL EXAMINATION: General appearance: alert, cooperative and no distress Head: Normocephalic, without obvious abnormality, atraumatic Neck: no adenopathy, no JVD, supple, symmetrical, trachea  midline and thyroid not enlarged, symmetric, no tenderness/mass/nodules Lymph nodes: Cervical, supraclavicular, and axillary nodes normal. Resp: clear to auscultation bilaterally Back: symmetric, no curvature. ROM normal. No CVA tenderness. Cardio: regular rate and rhythm, S1, S2 normal, no murmur, click, rub or gallop GI: soft, non-tender; bowel sounds normal; no masses,  no organomegaly Extremities: extremities normal, atraumatic, no cyanosis or edema Neurologic: Alert and oriented X 3, normal strength and tone. Normal symmetric reflexes. Normal coordination and gait  ECOG PERFORMANCE STATUS: 0 - Asymptomatic  Blood pressure 128/62, pulse 74, temperature 97.8 F (36.6 C), temperature source Oral, resp. rate 20, height _0  (1.6 m), weight 147 lb (66.679 kg), SpO2 100 %.  LABORATORY DATA: Lab Results  Component Value Date   WBC 8.2 06/06/2015   HGB 11.8 06/06/2015   HCT 36.6 06/06/2015   MCV 93.4 06/06/2015   PLT 327 06/06/2015      Chemistry      Component Value Date/Time   NA 142 06/06/2015 1116  NA 139 01/26/2015 0818   K 4.6 06/06/2015 1116   K 4.1 01/26/2015 0818   CL 103 01/26/2015 0818   CL 106 06/30/2012 1004   CO2 27 06/06/2015 1116   CO2 28 01/26/2015 0818   BUN 17.6 06/06/2015 1116   BUN 13 01/26/2015 0818   CREATININE 1.0 06/06/2015 1116   CREATININE 0.93 01/26/2015 0818      Component Value Date/Time   CALCIUM 9.7 06/06/2015 1116   CALCIUM 9.0 01/26/2015 0818   ALKPHOS 86 06/06/2015 1116   ALKPHOS 79 01/26/2015 0818   AST 17 06/06/2015 1116   AST 18 01/26/2015 0818   ALT 11 06/06/2015 1116   ALT 15 01/26/2015 0818   BILITOT 0.33 06/06/2015 1116   BILITOT 0.4 01/26/2015 0818       RADIOGRAPHIC STUDIES: Ct Chest W Contrast  05/12/2015  CLINICAL DATA:  Lung cancer to the spine and omentum. Ongoing chemotherapy. EXAM: CT CHEST, ABDOMEN, AND PELVIS WITH CONTRAST TECHNIQUE: Multidetector CT imaging of the chest, abdomen and pelvis was performed  following the standard protocol during bolus administration of intravenous contrast. CONTRAST:  100 cc Omnipaque 300 COMPARISON:  Multiple exams, including 01/06/2015 FINDINGS: CT CHEST FINDINGS Mediastinum/Nodes: Right IJ Port-A-Cath tip cavoatrial junction. Coronary, aortic arch, and branch vessel atherosclerotic vascular disease. Loop recorder on the left. No pathologic thoracic adenopathy. Lungs/Pleura: Biapical pleural parenchymal scarring not appreciably changed from prior, with a 3.0 by 1.4 cm nodular component medially at the right lung apex. This is probably best compared in short axis given its orientation, and was previously 1.4 cm as well. Severe emphysema. Secondary pulmonary lobular septal thickening at the right lung apex. Triangular bandlike density anteriorly in the right lower lobe measures a maximum of 0.9 by 1.2 cm, previously the same by my measurements, and by my measurements previously the same on 03/16/2012. Bilateral airway thickening. Musculoskeletal: Vertebra plana at T3 with vertebral augmentations at T4 and T7, no change from prior. CT ABDOMEN PELVIS FINDINGS Hepatobiliary: Mild extrahepatic biliary dilatation at 8 mm. By my measurement this was previously 6 mm. Pancreas: Unremarkable Spleen: Unremarkable Adrenals/Urinary Tract: Adrenal glands normal. Borderline accentuated enhancement of the right collecting system wall, but no filling defect, and this appearance is chronically stable. Stomach/Bowel: Mild prominence of stool in the colon. Vascular/Lymphatic: Aortoiliac atherosclerotic vascular disease. No pathologic adenopathy identified. Reproductive: Unremarkable Other: No supplemental non-categorized findings. 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 IMPRESSION: 1. No findings of active malignancy currently. Nodularity medially at the right lung apex and triangular scar-like nodularity anteriorly in the right upper lobe are stable over the last 3 years  and hands likely benign. 2. Atherosclerosis. 3. Vertebro plana at T3 with vertebral augmentations at T4 and T7. 4. Mild extrahepatic biliary dilatation at 8 mm, significance uncertain. 5.  Prominent stool throughout the colon favors constipation. 6. 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 7. Emphysema. 8. Airway thickening is present, suggesting bronchitis or reactive airways disease. Electronically Signed   By: Van Clines M.D.   On: 05/12/2015 10:46   Ct Abdomen Pelvis W Contrast  05/12/2015  CLINICAL DATA:  Lung cancer to the spine and omentum. Ongoing chemotherapy. EXAM: CT CHEST, ABDOMEN, AND PELVIS WITH CONTRAST TECHNIQUE: Multidetector CT imaging of the chest, abdomen and pelvis was performed following the standard protocol during bolus administration of intravenous contrast. CONTRAST:  100 cc Omnipaque 300 COMPARISON:  Multiple exams, including 01/06/2015 FINDINGS: CT CHEST FINDINGS Mediastinum/Nodes: Right IJ Port-A-Cath tip cavoatrial junction.  Coronary, aortic arch, and branch vessel atherosclerotic vascular disease. Loop recorder on the left. No pathologic thoracic adenopathy. Lungs/Pleura: Biapical pleural parenchymal scarring not appreciably changed from prior, with a 3.0 by 1.4 cm nodular component medially at the right lung apex. This is probably best compared in short axis given its orientation, and was previously 1.4 cm as well. Severe emphysema. Secondary pulmonary lobular septal thickening at the right lung apex. Triangular bandlike density anteriorly in the right lower lobe measures a maximum of 0.9 by 1.2 cm, previously the same by my measurements, and by my measurements previously the same on 03/16/2012. Bilateral airway thickening. Musculoskeletal: Vertebra plana at T3 with vertebral augmentations at T4 and T7, no change from prior. CT ABDOMEN PELVIS FINDINGS Hepatobiliary: Mild extrahepatic biliary dilatation at 8 mm. By my measurement this was  previously 6 mm. Pancreas: Unremarkable Spleen: Unremarkable Adrenals/Urinary Tract: Adrenal glands normal. Borderline accentuated enhancement of the right collecting system wall, but no filling defect, and this appearance is chronically stable. Stomach/Bowel: Mild prominence of stool in the colon. Vascular/Lymphatic: Aortoiliac atherosclerotic vascular disease. No pathologic adenopathy identified. Reproductive: Unremarkable Other: No supplemental non-categorized findings. 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 IMPRESSION: 1. No findings of active malignancy currently. Nodularity medially at the right lung apex and triangular scar-like nodularity anteriorly in the right upper lobe are stable over the last 3 years and hands likely benign. 2. Atherosclerosis. 3. Vertebro plana at T3 with vertebral augmentations at T4 and T7. 4. Mild extrahepatic biliary dilatation at 8 mm, significance uncertain. 5.  Prominent stool throughout the colon favors constipation. 6. 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 7. Emphysema. 8. Airway thickening is present, suggesting bronchitis or reactive airways disease. Electronically Signed   By: Van Clines M.D.   On: 05/12/2015 10:46   ASSESSMENT AND PLAN: This is a very pleasant 65 years old white female with metastatic non-small cell lung cancer currently undergoing maintenance chemotherapy with single agent Alimta status post 134 cycles. The patient is tolerating her treatment fairly well with no significant adverse effects.  I recommended for her to continue with her maintenance chemotherapy and the patient will receive cycle #135 today. For pain management, she will continue on her current pain medication with MS Contin and MS IR. She was given a refill for MS IR today. The patient would come back for follow-up visit in 3 weeks for reevaluation before starting cycle #136 of her maintenance therapy. She  was advised to call immediately if she has any concerning symptoms in the interval. The patient voices understanding of current disease status and treatment options and is in agreement with the current care plan.  All questions were answered. The patient knows to call the clinic with any problems, questions or concerns. We can certainly see the patient much sooner if necessary.  Disclaimer: This note was dictated with voice recognition software. Similar sounding words can inadvertently be transcribed and may not be corrected upon review.

## 2015-06-06 NOTE — Patient Instructions (Signed)
[facility] Discharge Instructions for Patients Receiving Chemotherapy  Today you received the following chemotherapy agents alimta  To help prevent nausea and vomiting after your treatment, we encourage you to take your nausea medication as directed  If you develop nausea and vomiting that is not controlled by your nausea medication, call the clinic.   BELOW ARE SYMPTOMS THAT SHOULD BE REPORTED IMMEDIATELY:  *FEVER GREATER THAN 100.5 F  *CHILLS WITH OR WITHOUT FEVER  NAUSEA AND VOMITING THAT IS NOT CONTROLLED WITH YOUR NAUSEA MEDICATION  *UNUSUAL SHORTNESS OF BREATH  *UNUSUAL BRUISING OR BLEEDING  TENDERNESS IN MOUTH AND THROAT WITH OR WITHOUT PRESENCE OF ULCERS  *URINARY PROBLEMS  *BOWEL PROBLEMS  UNUSUAL RASH Items with * indicate a potential emergency and should be followed up as soon as possible.  Feel free to call the clinic you have any questions or concerns. The clinic phone number is [phone].  

## 2015-06-07 ENCOUNTER — Encounter: Payer: Self-pay | Admitting: *Deleted

## 2015-06-07 DIAGNOSIS — C3411 Malignant neoplasm of upper lobe, right bronchus or lung: Secondary | ICD-10-CM

## 2015-06-08 ENCOUNTER — Other Ambulatory Visit: Payer: Self-pay | Admitting: Internal Medicine

## 2015-06-12 ENCOUNTER — Other Ambulatory Visit: Payer: Self-pay | Admitting: Medical Oncology

## 2015-06-12 ENCOUNTER — Other Ambulatory Visit: Payer: Self-pay | Admitting: Family Medicine

## 2015-06-12 ENCOUNTER — Telehealth: Payer: Self-pay | Admitting: *Deleted

## 2015-06-12 DIAGNOSIS — C3410 Malignant neoplasm of upper lobe, unspecified bronchus or lung: Secondary | ICD-10-CM

## 2015-06-12 DIAGNOSIS — C3490 Malignant neoplasm of unspecified part of unspecified bronchus or lung: Secondary | ICD-10-CM

## 2015-06-12 MED ORDER — MORPHINE SULFATE ER 60 MG PO TBCR: 60.0000 mg | EXTENDED_RELEASE_TABLET | Freq: Two times a day (BID) | ORAL | Status: DC

## 2015-06-12 NOTE — Telephone Encounter (Signed)
Patient called requesting refill for "Morphine 60 mg, 12 hr pills.  I will come in tomorrow to pick this up.  Return number If any questions before pick up is [phone].

## 2015-06-13 ENCOUNTER — Ambulatory Visit (INDEPENDENT_AMBULATORY_CARE_PROVIDER_SITE_OTHER): Payer: Medicare Other | Admitting: *Deleted

## 2015-06-13 DIAGNOSIS — I639 Cerebral infarction, unspecified: Secondary | ICD-10-CM | POA: Diagnosis not present

## 2015-06-14 ENCOUNTER — Other Ambulatory Visit: Payer: Self-pay | Admitting: Internal Medicine

## 2015-06-14 NOTE — Progress Notes (Signed)
[facility] Summary Report / Loop Recorder 

## 2015-06-19 NOTE — Progress Notes (Signed)
06/07/2015 Met with patient briefly in exam room, to offer our thanks for her participation in the [place] [id number] observational research study. Thanked patient again for her ongoing permission to collect blood samples, patient-reported outcomes and clinical data from her records. Notified patient that her study participation will be completed at 36 months, on 06/10/2015. Cindy S. Brigitte Pulse BSN, RN, [place] 06/19/2015 10:42 AM

## 2015-06-21 LAB — CUP PACEART REMOTE DEVICE CHECK: MDC IDC SESS DTM: 20170507210620

## 2015-06-27 ENCOUNTER — Ambulatory Visit (HOSPITAL_BASED_OUTPATIENT_CLINIC_OR_DEPARTMENT_OTHER): Payer: Medicare Other

## 2015-06-27 ENCOUNTER — Encounter: Payer: Self-pay | Admitting: Internal Medicine

## 2015-06-27 ENCOUNTER — Other Ambulatory Visit (HOSPITAL_BASED_OUTPATIENT_CLINIC_OR_DEPARTMENT_OTHER): Payer: Medicare Other

## 2015-06-27 ENCOUNTER — Ambulatory Visit (HOSPITAL_BASED_OUTPATIENT_CLINIC_OR_DEPARTMENT_OTHER): Payer: Medicare Other | Admitting: Internal Medicine

## 2015-06-27 VITALS — BP 111/51 | HR 74 | Temp 98.0°F | Resp 18 | Ht 63.0 in | Wt 148.9 lb

## 2015-06-27 DIAGNOSIS — C3491 Malignant neoplasm of unspecified part of right bronchus or lung: Secondary | ICD-10-CM

## 2015-06-27 DIAGNOSIS — C7931 Secondary malignant neoplasm of brain: Secondary | ICD-10-CM

## 2015-06-27 DIAGNOSIS — Z5111 Encounter for antineoplastic chemotherapy: Secondary | ICD-10-CM | POA: Diagnosis not present

## 2015-06-27 DIAGNOSIS — C7951 Secondary malignant neoplasm of bone: Secondary | ICD-10-CM

## 2015-06-27 DIAGNOSIS — C3411 Malignant neoplasm of upper lobe, right bronchus or lung: Secondary | ICD-10-CM

## 2015-06-27 LAB — COMPREHENSIVE METABOLIC PANEL
ALT: 10 U/L (ref 0–55)
AST: 18 U/L (ref 5–34)
Albumin: 3.2 g/dL — ABNORMAL LOW (ref 3.5–5.0)
Alkaline Phosphatase: 80 U/L (ref 40–150)
Anion Gap: 8 mEq/L (ref 3–11)
BUN: 14.2 mg/dL (ref 7.0–26.0)
CALCIUM: 9.5 mg/dL (ref 8.4–10.4)
CHLORIDE: 109 meq/L (ref 98–109)
CO2: 26 meq/L (ref 22–29)
Creatinine: 0.9 mg/dL (ref 0.6–1.1)
EGFR: 69 mL/min/{1.73_m2} — ABNORMAL LOW (ref >90)
Glucose: 117 mg/dl (ref 70–140)
POTASSIUM: 4.6 meq/L (ref 3.5–5.1)
SODIUM: 143 meq/L (ref 136–145)
Total Bilirubin: <0.3 mg/dL (ref 0.20–1.20)
Total Protein: 6.6 g/dL (ref 6.4–8.3)

## 2015-06-27 LAB — CBC WITH DIFFERENTIAL/PLATELET
BASO%: 0.5 % (ref 0.0–2.0)
Basophils Absolute: 0 10*3/uL (ref 0.0–0.1)
EOS ABS: 0.1 10*3/uL (ref 0.0–0.5)
EOS%: 1.1 % (ref 0.0–7.0)
HCT: 34.5 % — ABNORMAL LOW (ref 34.8–46.6)
HEMOGLOBIN: 11.2 g/dL — AB (ref 11.6–15.9)
LYMPH%: 23.8 % (ref 14.0–49.7)
MCH: 30.1 pg (ref 25.1–34.0)
MCHC: 32.6 g/dL (ref 31.5–36.0)
MCV: 92.4 fL (ref 79.5–101.0)
MONO#: 0.9 10*3/uL (ref 0.1–0.9)
MONO%: 11.1 % (ref 0.0–14.0)
NEUT%: 63.5 % (ref 38.4–76.8)
NEUTROS ABS: 4.9 10*3/uL (ref 1.5–6.5)
PLATELETS: 362 10*3/uL (ref 145–400)
RBC: 3.73 10*6/uL (ref 3.70–5.45)
RDW: 14.7 % — AB (ref 11.2–14.5)
WBC: 7.7 10*3/uL (ref 3.9–10.3)
lymph#: 1.8 10*3/uL (ref 0.9–3.3)

## 2015-06-27 MED ORDER — SODIUM CHLORIDE 0.9 % IV SOLN
Freq: Once | INTRAVENOUS | Status: AC
  Administered 2015-06-27: 10:00:00 via INTRAVENOUS
  Filled 2015-06-27: qty 4

## 2015-06-27 MED ORDER — SODIUM CHLORIDE 0.9 % IJ SOLN
10.0000 mL | INTRAMUSCULAR | Status: DC | PRN
  Administered 2015-06-27: 10 mL
  Filled 2015-06-27: qty 10

## 2015-06-27 MED ORDER — HEPARIN SOD (PORK) LOCK FLUSH 100 UNIT/ML IV SOLN
500.0000 [IU] | Freq: Once | INTRAVENOUS | Status: AC | PRN
  Administered 2015-06-27: 500 [IU]
  Filled 2015-06-27: qty 5

## 2015-06-27 MED ORDER — SODIUM CHLORIDE 0.9 % IV SOLN
500.0000 mg/m2 | Freq: Once | INTRAVENOUS | Status: AC
  Administered 2015-06-27: 900 mg via INTRAVENOUS
  Filled 2015-06-27: qty 32

## 2015-06-27 MED ORDER — ONDANSETRON HCL 8 MG PO TABS: ORAL_TABLET | ORAL | Status: DC

## 2015-06-27 MED ORDER — SODIUM CHLORIDE 0.9 % IV SOLN
Freq: Once | INTRAVENOUS | Status: AC
  Administered 2015-06-27: 10:00:00 via INTRAVENOUS

## 2015-06-27 NOTE — Progress Notes (Signed)
[place] [phone]   [phone]  OFFICE PROGRESS NOTE  [name], MD [place] [address]  Diagnosis:  Metastatic non-small cell lung cancer initially diagnosed with locally advanced stage IIIB with right Pancoast tumor involving the vertebral body as well as the foraminal canal invasion with spinal cord compression in October of 2007.   Prior Therapy:  1. Status post concurrent chemoradiation with weekly carboplatin and paclitaxel, last dose was given November 18, 2005. 2. 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 3. Status post gamma knife radiotherapy to a solitary brain lesion located in the superior frontal area of the brain at [facility] in April of 2008. 4. Status post palliative radiotherapy to the lateral abdominal wall metastatic lesion under the care of Dr. Lisbeth Renshaw, completed March of 2009. 5. Status post 6 cycles of systemic chemotherapy with carboplatin and Alimta. Last dose was given July 26, 2007 with disease stabilization. 6. Gamma knife stereotactic radiotherapy to 2 brain lesions one involving the right frontal dural based as well as right parietal lesion performed on 05/07/2012 under the care of Dr. Vallarie Mare at [facility].  Current Therapy:  Maintenance chemotherapy with Alimta 500 mg per meter squared given every 3 weeks. The patient is status post 135 cycles.   CHEMOTHERAPY INTENT: Palliative/maintenance  CURRENT # OF CHEMOTHERAPY CYCLES: 136 CURRENT ANTIEMETICS: Zofran, dexamethasone and Compazine  CURRENT SMOKING STATUS: Quit smoking 10/07/2005  ORAL CHEMOTHERAPY AND CONSENT: None  CURRENT BISPHOSPHONATES USE: None  PAIN MANAGEMENT: 0/10 currently on morphine  NARCOTICS INDUCED CONSTIPATION: No constipation  LIVING WILL AND CODE STATUS: No CODE BLUE  INTERVAL HISTORY: Danielle [name] 65 y.o. female  returns to the clinic today for followup visit accompanied by her sister. The patient is feeling fine today with no specific complaints. She denied having any significant fever or chills. She is tolerating her maintenance treatment with single agent Alimta fairly well with no significant adverse effects. The patient denied having any chest pain, shortness of breath, or hemoptysis. No nausea or vomiting. The patient denied having any weight loss or night sweats. She gained 1 pound since her last visit. She is here today to start cycle 136. She is requesting refill on Zofran and Xanax.  MEDICAL HISTORY: Past Medical History  Diagnosis Date  . Hypertension   . Anemia   . H/O: pneumonia   . History of tobacco abuse quit 10/08    on nicotine patch  . Fibromyalgia   . Hypokalemia   . DJD (degenerative joint disease), cervical   . Thrush 12/11/2010  . CVA (cerebral vascular accident) ([place]) 04/2014    pt states had 2 cva's within 2 wks  . Lung cancer ([place]) dx'd 09/2005    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 had chemo and radiation for lung ca    ALLERGIES:  is allergic to contrast media; iohexol; sulfa drugs cross reactors; and co-trimoxazole injection.  MEDICATIONS:  Current Outpatient Prescriptions  Medication Sig Dispense Refill  . ALPRAZolam (XANAX) 0.25 MG tablet Take 1 tablet (0.25 mg total) by mouth at bedtime as needed. for sleep 30 tablet 0  . amLODipine (NORVASC) 10 MG tablet TAKE 1 TABLET (10 MG TOTAL) BY MOUTH DAILY. 90 tablet 1  . aspirin EC 325 MG tablet TAKE 1 TABLET (325 MG TOTAL) BY MOUTH DAILY. 30 tablet 0  . atorvastatin (LIPITOR) 40 MG tablet TAKE 1  TABLET BY MOUTH EVERY DAY AT 6 PM 90 tablet 2  . fluticasone (FLONASE) 50 MCG/ACT nasal spray USE 1-2 SPRAYS IN EACH NOSTRIL DAILY (Patient taking differently: USE 1-2 SPRAYS IN EACH NOSTRIL DAILY PRN CONGESTION) 16 g 11  . folic acid (FOLVITE) 1 MG tablet TAKE 1 TABLET BY MOUTH DAILY. 90 tablet 1  . furosemide (LASIX) 20 MG  tablet TAKE 1 TABLET (20 MG TOTAL) BY MOUTH DAILY AS NEEDED FOR EDEMA. 20 tablet 0  . lidocaine-prilocaine (EMLA) cream Apply to Port-A-Cath as directed 30 g 1  . lisinopril (PRINIVIL,ZESTRIL) 20 MG tablet TAKE 2 TABLETS (40 MG TOTAL) BY MOUTH DAILY. 60 tablet 5  . morphine (MS CONTIN) 60 MG 12 hr tablet Take 1 tablet (60 mg total) by mouth 2 (two) times daily. 60 tablet 0  . morphine (MSIR) 30 MG tablet Take 1 tablet (30 mg total) by mouth every 4 (four) hours as needed (breakthrough pain). 60 tablet 0  . Olopatadine HCl 0.2 % SOLN Apply 1 drop to eye daily. 2.5 mL 11  . ondansetron (ZOFRAN) 8 MG tablet TAKE 1 TABLET (8 MG TOTAL) BY MOUTH 2 (TWO) TIMES DAILY AS NEEDED FOR NAUSEA OR VOMITING. 30 tablet 2  . predniSONE (DELTASONE) 50 MG tablet Take 1 tablet (50 mg total) by mouth See admin instructions. Take 1 tablet (50 mg) 13 hours before CT scan, 7 hours before CT scan and 1 hour before CT scan (every 9 weeks) (Patient not taking: Reported on 06/27/2015) 3 tablet 1   No current facility-administered medications for this visit.    SURGICAL HISTORY:  Past Surgical History  Procedure Laterality Date  . Cervical laminectomy  1995  . Other surgical history  2008    Gamma knife surgery to remove brain met   . 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 Knee surgery  1990    Left x 2  . Portacath placement  -[name]    TIP IN CAVOATRIAL JUNCTION  . Loop recorder implant N/A 04/19/2014    Procedure: LOOP RECORDER IMPLANT;  Surgeon: Thompson Grayer, MD;  Location: [facility] CATH LAB;  Service: Cardiovascular;  Laterality: N/A;  . Tee without cardioversion N/A 04/19/2014    Procedure: TRANSESOPHAGEAL ECHOCARDIOGRAM (TEE);  Surgeon: Larey Dresser, MD;  Location: [facility] ENDOSCOPY;  Service: Cardiovascular;  Laterality: N/A;    REVIEW OF SYSTEMS:  A comprehensive review of systems was negative.   PHYSICAL EXAMINATION: General appearance: alert, cooperative and no distress Head:  Normocephalic, without obvious abnormality, atraumatic Neck: no adenopathy, no JVD, supple, symmetrical, trachea midline and thyroid not enlarged, symmetric, no tenderness/mass/nodules Lymph nodes: Cervical, supraclavicular, and axillary nodes normal. Resp: clear to auscultation bilaterally Back: symmetric, no curvature. ROM normal. No CVA tenderness. Cardio: regular rate and rhythm, S1, S2 normal, no murmur, click, rub or gallop GI: soft, non-tender; bowel sounds normal; no masses,  no organomegaly Extremities: extremities normal, atraumatic, no cyanosis or edema Neurologic: Alert and oriented X 3, normal strength and tone. Normal symmetric reflexes. Normal coordination and gait  ECOG PERFORMANCE STATUS: 0 - Asymptomatic  Blood pressure 111/51, pulse 74, temperature 98 F (36.7 C), temperature source Oral, resp. rate 18, height '5\' 3"'  (1.6 m), weight 148 lb 14.4 oz (67.541 kg), SpO2 99 %.  LABORATORY DATA: Lab Results  Component Value Date   WBC 7.7 06/27/2015   HGB 11.2* 06/27/2015   HCT 34.5* 06/27/2015   MCV 92.4 06/27/2015   PLT 362  06/27/2015      Chemistry      Component Value Date/Time   NA 143 06/27/2015 0901   NA 139 01/26/2015 0818   K 4.6 06/27/2015 0901   K 4.1 01/26/2015 0818   CL 103 01/26/2015 0818   CL 106 06/30/2012 1004   CO2 26 06/27/2015 0901   CO2 28 01/26/2015 0818   BUN 14.2 06/27/2015 0901   BUN 13 01/26/2015 0818   CREATININE 0.9 06/27/2015 0901   CREATININE 0.93 01/26/2015 0818      Component Value Date/Time   CALCIUM 9.5 06/27/2015 0901   CALCIUM 9.0 01/26/2015 0818   ALKPHOS 80 06/27/2015 0901   ALKPHOS 79 01/26/2015 0818   AST 18 06/27/2015 0901   AST 18 01/26/2015 0818   ALT 10 06/27/2015 0901   ALT 15 01/26/2015 0818   BILITOT <0.30 06/27/2015 0901   BILITOT 0.4 01/26/2015 0818       RADIOGRAPHIC STUDIES: No results found. ASSESSMENT AND PLAN: This is a very pleasant 65 years old white female with metastatic non-small cell lung  cancer currently undergoing maintenance chemotherapy with single agent Alimta status post 135 cycles. The patient is tolerating her treatment fairly well with no significant adverse effects.  I recommended for her to continue with her maintenance chemotherapy and the patient will receive cycle #136 today. For pain management, she will continue on her current pain medication with MS Contin and MS IR. The patient would come back for follow-up visit in 3 weeks for reevaluation before starting cycle #137. The patient was given a refill for Zofran and Xanax today. She was advised to call immediately if she has any concerning symptoms in the interval. The patient voices understanding of current disease status and treatment options and is in agreement with the current care plan.  All questions were answered. The patient knows to call the clinic with any problems, questions or concerns. We can certainly see the patient much sooner if necessary.  Disclaimer: This note was dictated with voice recognition software. Similar sounding words can inadvertently be transcribed and may not be corrected upon review.

## 2015-06-27 NOTE — Patient Instructions (Signed)
[facility] Discharge Instructions for Patients Receiving Chemotherapy  Today you received the following chemotherapy agent: Alimta   To help prevent nausea and vomiting after your treatment, we encourage you to take your nausea medication as prescribed.    If you develop nausea and vomiting that is not controlled by your nausea medication, call the clinic.   BELOW ARE SYMPTOMS THAT SHOULD BE REPORTED IMMEDIATELY:  *FEVER GREATER THAN 100.5 F  *CHILLS WITH OR WITHOUT FEVER  NAUSEA AND VOMITING THAT IS NOT CONTROLLED WITH YOUR NAUSEA MEDICATION  *UNUSUAL SHORTNESS OF BREATH  *UNUSUAL BRUISING OR BLEEDING  TENDERNESS IN MOUTH AND THROAT WITH OR WITHOUT PRESENCE OF ULCERS  *URINARY PROBLEMS  *BOWEL PROBLEMS  UNUSUAL RASH Items with * indicate a potential emergency and should be followed up as soon as possible.  Feel free to call the clinic you have any questions or concerns. The clinic phone number is [phone].  Please show the [place] at check-in to the Emergency Department and triage nurse.

## 2015-06-29 ENCOUNTER — Other Ambulatory Visit: Payer: Self-pay | Admitting: Internal Medicine

## 2015-06-30 ENCOUNTER — Telehealth: Payer: Self-pay | Admitting: Medical Oncology

## 2015-06-30 NOTE — Telephone Encounter (Signed)
Faxed xanax

## 2015-07-05 ENCOUNTER — Other Ambulatory Visit: Payer: Self-pay | Admitting: Medical Oncology

## 2015-07-05 DIAGNOSIS — C7931 Secondary malignant neoplasm of brain: Secondary | ICD-10-CM

## 2015-07-05 DIAGNOSIS — C3411 Malignant neoplasm of upper lobe, right bronchus or lung: Secondary | ICD-10-CM

## 2015-07-05 MED ORDER — ASPIRIN EC 325 MG PO TBEC: 325.0000 mg | DELAYED_RELEASE_TABLET | Freq: Every day | ORAL | Status: DC

## 2015-07-10 ENCOUNTER — Other Ambulatory Visit: Payer: Self-pay | Admitting: Internal Medicine

## 2015-07-12 LAB — CUP PACEART REMOTE DEVICE CHECK: Date Time Interrogation Session: 20170606213758

## 2015-07-13 ENCOUNTER — Ambulatory Visit (INDEPENDENT_AMBULATORY_CARE_PROVIDER_SITE_OTHER): Payer: Medicare Other | Admitting: *Deleted

## 2015-07-13 DIAGNOSIS — I639 Cerebral infarction, unspecified: Secondary | ICD-10-CM | POA: Diagnosis not present

## 2015-07-14 NOTE — Progress Notes (Signed)
[facility] Summary Report / Loop Recorder 

## 2015-07-18 ENCOUNTER — Ambulatory Visit (HOSPITAL_BASED_OUTPATIENT_CLINIC_OR_DEPARTMENT_OTHER): Payer: Medicare Other

## 2015-07-18 ENCOUNTER — Ambulatory Visit (HOSPITAL_BASED_OUTPATIENT_CLINIC_OR_DEPARTMENT_OTHER): Payer: Medicare Other | Admitting: Oncology

## 2015-07-18 ENCOUNTER — Other Ambulatory Visit (HOSPITAL_BASED_OUTPATIENT_CLINIC_OR_DEPARTMENT_OTHER): Payer: Medicare Other

## 2015-07-18 ENCOUNTER — Other Ambulatory Visit: Payer: Self-pay | Admitting: Oncology

## 2015-07-18 ENCOUNTER — Encounter: Payer: Self-pay | Admitting: Oncology

## 2015-07-18 VITALS — BP 146/56 | HR 87 | Temp 98.1°F | Resp 18 | Ht 63.0 in | Wt 143.0 lb

## 2015-07-18 DIAGNOSIS — C3490 Malignant neoplasm of unspecified part of unspecified bronchus or lung: Secondary | ICD-10-CM | POA: Diagnosis not present

## 2015-07-18 DIAGNOSIS — C7931 Secondary malignant neoplasm of brain: Secondary | ICD-10-CM | POA: Diagnosis not present

## 2015-07-18 DIAGNOSIS — Z5111 Encounter for antineoplastic chemotherapy: Secondary | ICD-10-CM

## 2015-07-18 DIAGNOSIS — C3411 Malignant neoplasm of upper lobe, right bronchus or lung: Secondary | ICD-10-CM

## 2015-07-18 DIAGNOSIS — C3410 Malignant neoplasm of upper lobe, unspecified bronchus or lung: Secondary | ICD-10-CM

## 2015-07-18 LAB — CBC WITH DIFFERENTIAL/PLATELET
BASO%: 0.4 % (ref 0.0–2.0)
BASOS ABS: 0 10*3/uL (ref 0.0–0.1)
EOS ABS: 0.1 10*3/uL (ref 0.0–0.5)
EOS%: 0.8 % (ref 0.0–7.0)
HEMATOCRIT: 35.9 % (ref 34.8–46.6)
HEMOGLOBIN: 11.6 g/dL (ref 11.6–15.9)
LYMPH#: 1.9 10*3/uL (ref 0.9–3.3)
LYMPH%: 25 % (ref 14.0–49.7)
MCH: 30.3 pg (ref 25.1–34.0)
MCHC: 32.2 g/dL (ref 31.5–36.0)
MCV: 94 fL (ref 79.5–101.0)
MONO#: 0.8 10*3/uL (ref 0.1–0.9)
MONO%: 10.6 % (ref 0.0–14.0)
NEUT#: 4.8 10*3/uL (ref 1.5–6.5)
NEUT%: 63.2 % (ref 38.4–76.8)
PLATELETS: 381 10*3/uL (ref 145–400)
RBC: 3.82 10*6/uL (ref 3.70–5.45)
RDW: 15.6 % — AB (ref 11.2–14.5)
WBC: 7.6 10*3/uL (ref 3.9–10.3)

## 2015-07-18 LAB — COMPREHENSIVE METABOLIC PANEL
ALBUMIN: 3.3 g/dL — AB (ref 3.5–5.0)
ALK PHOS: 97 U/L (ref 40–150)
ALT: 10 U/L (ref 0–55)
ANION GAP: 9 meq/L (ref 3–11)
AST: 20 U/L (ref 5–34)
BUN: 13.8 mg/dL (ref 7.0–26.0)
CALCIUM: 9.9 mg/dL (ref 8.4–10.4)
CHLORIDE: 108 meq/L (ref 98–109)
CO2: 25 mEq/L (ref 22–29)
CREATININE: 0.8 mg/dL (ref 0.6–1.1)
EGFR: 75 mL/min/{1.73_m2} — ABNORMAL LOW (ref >90)
Glucose: 117 mg/dl (ref 70–140)
POTASSIUM: 4.7 meq/L (ref 3.5–5.1)
Sodium: 142 mEq/L (ref 136–145)
Total Bilirubin: <0.3 mg/dL (ref 0.20–1.20)
Total Protein: 7.1 g/dL (ref 6.4–8.3)

## 2015-07-18 MED ORDER — SODIUM CHLORIDE 0.9 % IV SOLN
500.0000 mg/m2 | Freq: Once | INTRAVENOUS | Status: AC
  Administered 2015-07-18: 900 mg via INTRAVENOUS
  Filled 2015-07-18: qty 32

## 2015-07-18 MED ORDER — MORPHINE SULFATE 30 MG PO TABS: 30.0000 mg | ORAL_TABLET | ORAL | Status: DC | PRN

## 2015-07-18 MED ORDER — SODIUM CHLORIDE 0.9 % IV SOLN
Freq: Once | INTRAVENOUS | Status: AC
  Administered 2015-07-18: 10:00:00 via INTRAVENOUS

## 2015-07-18 MED ORDER — SODIUM CHLORIDE 0.9 % IJ SOLN
10.0000 mL | INTRAMUSCULAR | Status: DC | PRN
  Administered 2015-07-18: 10 mL
  Filled 2015-07-18: qty 10

## 2015-07-18 MED ORDER — MORPHINE SULFATE ER 60 MG PO TBCR: 60.0000 mg | EXTENDED_RELEASE_TABLET | Freq: Two times a day (BID) | ORAL | Status: DC

## 2015-07-18 MED ORDER — ONDANSETRON HCL 8 MG PO TABS
ORAL_TABLET | ORAL | Status: AC
  Filled 2015-07-18: qty 1

## 2015-07-18 MED ORDER — HEPARIN SOD (PORK) LOCK FLUSH 100 UNIT/ML IV SOLN
500.0000 [IU] | Freq: Once | INTRAVENOUS | Status: AC | PRN
  Administered 2015-07-18: 500 [IU]
  Filled 2015-07-18: qty 5

## 2015-07-18 MED ORDER — SODIUM CHLORIDE 0.9 % IV SOLN
Freq: Once | INTRAVENOUS | Status: AC
  Administered 2015-07-18: 10:00:00 via INTRAVENOUS
  Filled 2015-07-18: qty 4

## 2015-07-18 NOTE — Patient Instructions (Signed)
[facility] Discharge Instructions for Patients Receiving Chemotherapy  Today you received the following chemotherapy agents Alimta  To help prevent nausea and vomiting after your treatment, we encourage you to take your nausea medication   If you develop nausea and vomiting that is not controlled by your nausea medication, call the clinic.   BELOW ARE SYMPTOMS THAT SHOULD BE REPORTED IMMEDIATELY:  *FEVER GREATER THAN 100.5 F  *CHILLS WITH OR WITHOUT FEVER  NAUSEA AND VOMITING THAT IS NOT CONTROLLED WITH YOUR NAUSEA MEDICATION  *UNUSUAL SHORTNESS OF BREATH  *UNUSUAL BRUISING OR BLEEDING  TENDERNESS IN MOUTH AND THROAT WITH OR WITHOUT PRESENCE OF ULCERS  *URINARY PROBLEMS  *BOWEL PROBLEMS  UNUSUAL RASH Items with * indicate a potential emergency and should be followed up as soon as possible.  Feel free to call the clinic you have any questions or concerns. The clinic phone number is [phone].  Please show the CHEMO ALERT CARD at check-in to the Emergency Department and triage nurse.   

## 2015-07-18 NOTE — Progress Notes (Signed)
[facility] [phone]   [phone]  OFFICE PROGRESS NOTE  [name], MD [place] [address]  Diagnosis:  Metastatic non-small cell lung cancer initially diagnosed with locally advanced stage IIIB with right Pancoast tumor involving the vertebral body as well as the foraminal canal invasion with spinal cord compression in October of 2007.   Prior Therapy:  1. Status post concurrent chemoradiation with weekly carboplatin and paclitaxel, last dose was given November 18, 2005. 2. 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 3. Status post gamma knife radiotherapy to a solitary brain lesion located in the superior frontal area of the brain at [facility] in April of 2008. 4. Status post palliative radiotherapy to the lateral abdominal wall metastatic lesion under the care of Dr. Lisbeth Renshaw, completed March of 2009. 5. Status post 6 cycles of systemic chemotherapy with carboplatin and Alimta. Last dose was given July 26, 2007 with disease stabilization. 6. Gamma knife stereotactic radiotherapy to 2 brain lesions one involving the right frontal dural based as well as right parietal lesion performed on 05/07/2012 under the care of Dr. Vallarie Mare at [facility].  Current Therapy:  Maintenance chemotherapy with Alimta 500 mg per meter squared given every 3 weeks. The patient is status post 136 cycles.   CHEMOTHERAPY INTENT: Palliative/maintenance  CURRENT # OF CHEMOTHERAPY CYCLES: 137 CURRENT ANTIEMETICS: Zofran, dexamethasone and Compazine  CURRENT SMOKING STATUS: Quit smoking 10/07/2005  ORAL CHEMOTHERAPY AND CONSENT: None  CURRENT BISPHOSPHONATES USE: None  PAIN MANAGEMENT: 0/10 currently on morphine  NARCOTICS INDUCED CONSTIPATION: No constipation  LIVING WILL AND CODE STATUS: No CODE BLUE  INTERVAL HISTORY: Danielle [name] 65 y.o. female  returns to the clinic today for followup visit accompanied by her sister. The patient is feeling fine today with no specific complaints. She denied having any significant fever or chills. She is tolerating her maintenance treatment with single agent Alimta fairly well with no significant adverse effects. The patient denied having any chest pain, shortness of breath, or hemoptysis. Has intermittent nausea that is mostly controlled with Zofran. Will vomit on occasion. Has lost about 5 lbs., but states that she felt better after her most recent chemo and was more active. Eating well. Denies night sweats. She is here today to start cycle 137. She is requesting refill on MSIR and MSContin.  MEDICAL HISTORY: Past Medical History  Diagnosis Date  . Hypertension   . Anemia   . H/O: pneumonia   . History of tobacco abuse quit 10/08    on nicotine patch  . Fibromyalgia   . Hypokalemia   . DJD (degenerative joint disease), cervical   . Thrush 12/11/2010  . CVA (cerebral vascular accident) ([place]) 04/2014    pt states had 2 cva's within 2 wks  . Lung cancer ([place]) dx'd 09/2005    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 had chemo and radiation for lung ca    ALLERGIES:  is allergic to contrast media; iohexol; sulfa drugs cross reactors; and co-trimoxazole injection.  MEDICATIONS:  Current Outpatient Prescriptions  Medication Sig Dispense Refill  . ALPRAZolam (XANAX) 0.25 MG tablet TAKE 1 TABLET BY MOUTH AT BEDTIME AS NEEDED FOR SLEEP 30 tablet 0  . amLODipine (NORVASC) 10 MG tablet TAKE 1 TABLET (10 MG TOTAL) BY MOUTH DAILY. 90 tablet 1  . aspirin EC 325 MG tablet Take 1 tablet (325 mg total) by mouth  daily. 90 tablet 0  . atorvastatin (LIPITOR) 40 MG tablet TAKE 1 TABLET BY MOUTH EVERY DAY AT 6 PM 90 tablet 2  . fluticasone (FLONASE) 50 MCG/ACT nasal spray USE 1-2 SPRAYS IN EACH NOSTRIL DAILY (Patient taking differently: USE 1-2 SPRAYS IN EACH NOSTRIL DAILY PRN CONGESTION) 16 g 11  . folic acid  (FOLVITE) 1 MG tablet TAKE 1 TABLET BY MOUTH DAILY. 90 tablet 1  . furosemide (LASIX) 20 MG tablet TAKE 1 TABLET (20 MG TOTAL) BY MOUTH DAILY AS NEEDED FOR EDEMA. 20 tablet 0  . lidocaine-prilocaine (EMLA) cream Apply to Port-A-Cath as directed 30 g 1  . lisinopril (PRINIVIL,ZESTRIL) 20 MG tablet TAKE 2 TABLETS (40 MG TOTAL) BY MOUTH DAILY. 60 tablet 5  . morphine (MS CONTIN) 60 MG 12 hr tablet Take 1 tablet (60 mg total) by mouth 2 (two) times daily. 60 tablet 0  . morphine (MSIR) 30 MG tablet Take 1 tablet (30 mg total) by mouth every 4 (four) hours as needed (breakthrough pain). 60 tablet 0  . Olopatadine HCl 0.2 % SOLN Apply 1 drop to eye daily. 2.5 mL 11  . ondansetron (ZOFRAN) 8 MG tablet TAKE 1 TABLET (8 MG TOTAL) BY MOUTH 2 (TWO) TIMES DAILY AS NEEDED FOR NAUSEA OR VOMITING. 30 tablet 2  . predniSONE (DELTASONE) 50 MG tablet Take 1 tablet (50 mg total) by mouth See admin instructions. Take 1 tablet (50 mg) 13 hours before CT scan, 7 hours before CT scan and 1 hour before CT scan (every 9 weeks) 3 tablet 1   No current facility-administered medications for this visit.   Facility-Administered Medications Ordered in Other Visits  Medication Dose Route Frequency Provider Last Rate Last Dose  . heparin lock flush 100 unit/mL  500 Units Intracatheter Once PRN Chauncey Cruel, MD      . PEMEtrexed (ALIMTA) 900 mg in sodium chloride 0.9 % 100 mL chemo infusion  500 mg/m2 (Treatment Plan Actual) Intravenous Once Chauncey Cruel, MD      . sodium chloride 0.9 % injection 10 mL  10 mL Intracatheter PRN Chauncey Cruel, MD        SURGICAL HISTORY:  Past Surgical History  Procedure Laterality Date  . Cervical laminectomy  1995  . Other surgical history  2008    Gamma knife surgery to remove brain met   . 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 Knee surgery  1990    Left x 2  . Portacath placement  -[name]    TIP IN CAVOATRIAL JUNCTION  . Loop recorder  implant N/A 04/19/2014    Procedure: LOOP RECORDER IMPLANT;  Surgeon: Thompson Grayer, MD;  Location: [facility] CATH LAB;  Service: Cardiovascular;  Laterality: N/A;  . Tee without cardioversion N/A 04/19/2014    Procedure: TRANSESOPHAGEAL ECHOCARDIOGRAM (TEE);  Surgeon: Larey Dresser, MD;  Location: [facility] ENDOSCOPY;  Service: Cardiovascular;  Laterality: N/A;    REVIEW OF SYSTEMS:  A comprehensive review of systems was negative.   PHYSICAL EXAMINATION: General appearance: alert, cooperative and no distress Head: Normocephalic, without obvious abnormality, atraumatic Neck: no adenopathy, no JVD, supple, symmetrical, trachea midline and thyroid not enlarged, symmetric, no tenderness/mass/nodules Lymph nodes: Cervical, supraclavicular, and axillary nodes normal. Resp: clear to auscultation bilaterally Back: symmetric, no curvature. ROM normal. No CVA tenderness. Cardio: regular rate and rhythm, S1, S2 normal, no murmur, click, rub or gallop GI: soft, non-tender; bowel sounds normal; no masses,  no organomegaly Extremities: extremities normal, atraumatic, no cyanosis or edema Neurologic: Alert and oriented X 3, normal strength and tone. Normal symmetric reflexes. Normal coordination and gait  ECOG PERFORMANCE STATUS: 0 - Asymptomatic  Blood pressure 146/56, pulse 87, temperature 98.1 F (36.7 C), temperature source Oral, resp. rate 18, height 5' 3" (1.6 m), weight 143 lb (64.864 kg), SpO2 100 %.  LABORATORY DATA: Lab Results  Component Value Date   WBC 7.6 07/18/2015   HGB 11.6 07/18/2015   HCT 35.9 07/18/2015   MCV 94.0 07/18/2015   PLT 381 07/18/2015      Chemistry      Component Value Date/Time   NA 142 07/18/2015 0845   NA 139 01/26/2015 0818   K 4.7 07/18/2015 0845   K 4.1 01/26/2015 0818   CL 103 01/26/2015 0818   CL 106 06/30/2012 1004   CO2 25 07/18/2015 0845   CO2 28 01/26/2015 0818   BUN 13.8 07/18/2015 0845   BUN 13 01/26/2015 0818   CREATININE 0.8 07/18/2015 0845    CREATININE 0.93 01/26/2015 0818      Component Value Date/Time   CALCIUM 9.9 07/18/2015 0845   CALCIUM 9.0 01/26/2015 0818   ALKPHOS 97 07/18/2015 0845   ALKPHOS 79 01/26/2015 0818   AST 20 07/18/2015 0845   AST 18 01/26/2015 0818   ALT 10 07/18/2015 0845   ALT 15 01/26/2015 0818   BILITOT <0.30 07/18/2015 0845   BILITOT 0.4 01/26/2015 0818       RADIOGRAPHIC STUDIES: No results found. ASSESSMENT AND PLAN: This is a very pleasant 65 year old white female with metastatic non-small cell lung cancer currently undergoing maintenance chemotherapy with single agent Alimta status post 136 cycles. The patient is tolerating her treatment fairly well with no significant adverse effects.  I recommended for her to continue with her maintenance chemotherapy and the patient will receive cycle #137 today. For pain management, she will continue on her current pain medication with MS Contin and MS IR. I have refilled both of these today.   The patient would come back for follow-up visit in 3 weeks for reevaluation before starting cycle #138.  She was advised to call immediately if she has any concerning symptoms in the interval. The patient voices understanding of current disease status and treatment options and is in agreement with the current care plan.  All questions were answered. The patient knows to call the clinic with any problems, questions or concerns. We can certainly see the patient much sooner if necessary.   Mikey Bussing, DNP, AGPCNP-BC, AOCNP

## 2015-07-21 ENCOUNTER — Other Ambulatory Visit: Payer: Self-pay | Admitting: Internal Medicine

## 2015-07-26 ENCOUNTER — Telehealth: Payer: Self-pay

## 2015-07-26 DIAGNOSIS — E119 Type 2 diabetes mellitus without complications: Secondary | ICD-10-CM

## 2015-07-26 DIAGNOSIS — Z114 Encounter for screening for human immunodeficiency virus [HIV]: Secondary | ICD-10-CM

## 2015-07-26 DIAGNOSIS — Z1159 Encounter for screening for other viral diseases: Secondary | ICD-10-CM

## 2015-07-26 NOTE — Telephone Encounter (Signed)
CPE labs

## 2015-07-27 ENCOUNTER — Ambulatory Visit (INDEPENDENT_AMBULATORY_CARE_PROVIDER_SITE_OTHER): Payer: Medicare Other

## 2015-07-27 ENCOUNTER — Other Ambulatory Visit (INDEPENDENT_AMBULATORY_CARE_PROVIDER_SITE_OTHER): Payer: Medicare Other

## 2015-07-27 VITALS — BP 94/60 | HR 84 | Temp 97.7°F | Ht 63.0 in | Wt 141.2 lb

## 2015-07-27 DIAGNOSIS — E119 Type 2 diabetes mellitus without complications: Secondary | ICD-10-CM | POA: Diagnosis not present

## 2015-07-27 DIAGNOSIS — Z1159 Encounter for screening for other viral diseases: Secondary | ICD-10-CM | POA: Diagnosis not present

## 2015-07-27 DIAGNOSIS — Z114 Encounter for screening for human immunodeficiency virus [HIV]: Secondary | ICD-10-CM | POA: Diagnosis not present

## 2015-07-27 DIAGNOSIS — Z0000 Encounter for general adult medical examination without abnormal findings: Secondary | ICD-10-CM

## 2015-07-27 LAB — CUP PACEART REMOTE DEVICE CHECK: MDC IDC SESS DTM: 20170706213840

## 2015-07-27 LAB — HEMOGLOBIN A1C: HEMOGLOBIN A1C: 6.1 % (ref 4.6–6.5)

## 2015-07-27 NOTE — Patient Instructions (Addendum)
Danielle [name] , Thank you for taking time to come for your Medicare Wellness Visit. I appreciate your ongoing commitment to your health goals. Please review the following plan we discussed and let me know if I can assist you in the future.   These are the goals we discussed: Goals    . Increase physical activity     Starting 07/27/15, I will attempt to walk for at least 30 min daily as tolerated.        This is a list of the screening recommended for you and due dates:  Health Maintenance  Topic Date Due  . Shingles Vaccine  07/26/2016*  . Mammogram  07/27/2023*  . Pap Smear  07/27/2023*  . Flu Shot  08/08/2015  . Hemoglobin A1C  01/27/2016  . Complete foot exam   02/02/2016  . Eye exam for diabetics  05/21/2016  . Tetanus Vaccine  01/07/2018  . Colon Cancer Screening  06/21/2018  . Pneumococcal vaccine  Completed  .  Hepatitis C: One time screening is recommended by Center for Disease Control  (CDC) for  adults born from [birth year] through 1963.   Completed  . HIV Screening  Completed  *Topic was postponed. The date shown is not the original due date.    Preventive Care for Adults  A healthy lifestyle and preventive care can promote health and wellness. Preventive health guidelines for adults include the following key practices.  . A routine yearly physical is a good way to check with your health care provider about your health and preventive screening. It is a chance to share any concerns and updates on your health and to receive a thorough exam.  . Visit your dentist for a routine exam and preventive care every 6 months. Brush your teeth twice a day and floss once a day. Good oral hygiene prevents tooth decay and gum disease.  . The frequency of eye exams is based on your age, health, family medical history, use  of contact lenses, and other factors. Follow your health care provider's ecommendations for frequency of eye exams.  . Eat a healthy diet. Foods like vegetables, fruits,  whole grains, low-fat dairy products, and lean protein foods contain the nutrients you need without too many calories. Decrease your intake of foods high in solid fats, added sugars, and salt. Eat the right amount of calories for you. Get information about a proper diet from your health care provider, if necessary.  . Regular physical exercise is one of the most important things you can do for your health. Most adults should get at least 150 minutes of moderate-intensity exercise (any activity that increases your heart rate and causes you to sweat) each week. In addition, most adults need muscle-strengthening exercises on 2 or more days a week.  Silver Sneakers may be a benefit available to you. To determine eligibility, you may visit the website: www.silversneakers.com or contact program at 3367245403 Mon-Fri between 8AM-8PM.   . Maintain a healthy weight. The body mass index (BMI) is a screening tool to identify possible weight problems. It provides an estimate of body fat based on height and weight. Your health care provider can find your BMI and can help you achieve or maintain a healthy weight.   For adults 20 years and older: ? A BMI below 18.5 is considered underweight. ? A BMI of 18.5 to 24.9 is normal. ? A BMI of 25 to 29.9 is considered overweight. ? A BMI of 30 and above is considered obese.   Marland Kitchen  Maintain normal blood lipids and cholesterol levels by exercising and minimizing your intake of saturated fat. Eat a balanced diet with plenty of fruit and vegetables. Blood tests for lipids and cholesterol should begin at age 67 and be repeated every 5 years. If your lipid or cholesterol levels are high, you are over 50, or you are at high risk for heart disease, you may need your cholesterol levels checked more frequently. Ongoing high lipid and cholesterol levels should be treated with medicines if diet and exercise are not working.  . If you smoke, find out from your health care provider  how to quit. If you do not use tobacco, please do not start.  . If you choose to drink alcohol, please do not consume more than 2 drinks per day. One drink is considered to be 12 ounces (355 mL) of beer, 5 ounces (148 mL) of wine, or 1.5 ounces (44 mL) of liquor.  . If you are 61-38 years old, ask your health care provider if you should take aspirin to prevent strokes.  . Use sunscreen. Apply sunscreen liberally and repeatedly throughout the day. You should seek shade when your shadow is shorter than you. Protect yourself by wearing long sleeves, pants, a wide-brimmed hat, and sunglasses year round, whenever you are outdoors.  . Once a month, do a whole body skin exam, using a mirror to look at the skin on your back. Tell your health care provider of new moles, moles that have irregular borders, moles that are larger than a pencil eraser, or moles that have changed in shape or color.

## 2015-07-27 NOTE — Progress Notes (Signed)
I reviewed health advisor's note, was available for consultation, and agree with documentation and plan.   Signed,  Jere Bostrom T. Brodie Correll, MD  

## 2015-07-27 NOTE — Progress Notes (Signed)
Subjective:   Danielle [name] is a 65 y.o. female who presents for Medicare Annual (Subsequent) preventive examination.  Review of Systems:  N/A Cardiac Risk Factors include: advanced age (>39mn, >>85women);dyslipidemia;diabetes mellitus;hypertension     Objective:     Vitals: BP 94/60 mmHg  Pulse 84  Temp(Src) 97.7 F (36.5 C) (Oral)  Ht '5\' 3"'  (1.6 m)  Wt 141 lb 4 oz (64.071 kg)  BMI 25.03 kg/m2  SpO2 98%  Body mass index is 25.03 kg/(m^2).   Tobacco History  Smoking status  . Former Smoker -- 2.00 packs/day for 30 years  . Quit date: 10/07/2005  Smokeless tobacco  . Never Used     Counseling given: No   Past Medical History  Diagnosis Date  . Hypertension   . Anemia   . H/O: pneumonia   . History of tobacco abuse quit 10/08    on nicotine patch  . Fibromyalgia   . Hypokalemia   . DJD (degenerative joint disease), cervical   . Thrush 12/11/2010  . 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 Cervical laminectomy  1995  . Other surgical history  2008    Gamma knife surgery to remove brain met   . 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 Knee surgery  1990    Left x 2  . Portacath placement  -[name]    TIP IN CAVOATRIAL JUNCTION  . Loop recorder implant N/A 04/19/2014    Procedure: LOOP RECORDER IMPLANT;  Surgeon: JThompson Grayer MD;  [facility];  Service: Cardiovascular;  Laterality: N/A;  . Tee without cardioversion N/A 04/19/2014    Procedure: TRANSESOPHAGEAL ECHOCARDIOGRAM (TEE);  Surgeon: DLarey Dresser MD;  [facility];  Service: Cardiovascular;  Laterality: N/A;   Family History  Problem Relation Age of Onset  . Diabetes Father   . Lymphoma Father   . Stroke Father   . Liver cancer  Mother   . Lung cancer Mother   . Cancer Mother   . Hyperlipidemia Brother   . Healthy Daughter   . Healthy Son   . Healthy Son   . Cancer Sister 529   ovarian cancer   History  Sexual Activity  . Sexual Activity: No    Outpatient Encounter Prescriptions as of 07/27/2015  Medication Sig  . ALPRAZolam (XANAX) 0.25 MG tablet TAKE 1 TABLET BY MOUTH AT BEDTIME AS NEEDED FOR SLEEP  . amLODipine (NORVASC) 10 MG tablet TAKE 1 TABLET (10 MG TOTAL) BY MOUTH DAILY.  .Marland Kitchenaspirin EC 325 MG tablet Take 1 tablet (325 mg total) by mouth daily.  .Marland Kitchenatorvastatin (LIPITOR) 40 MG tablet TAKE 1 TABLET BY MOUTH EVERY DAY AT 6 PM  . fluticasone (FLONASE) 50 MCG/ACT nasal spray USE 1-2 SPRAYS IN EACH NOSTRIL DAILY (Patient taking differently: USE 1-2 SPRAYS IN EACH NOSTRIL DAILY PRN CONGESTION)  . folic acid (FOLVITE) 1 MG tablet TAKE 1 TABLET BY MOUTH DAILY.  . furosemide (LASIX) 20 MG tablet TAKE 1 TABLET (20 MG TOTAL) BY MOUTH DAILY AS NEEDED FOR EDEMA.  . lidocaine-prilocaine (EMLA) cream Apply to Port-A-Cath as directed  . lisinopril (PRINIVIL,ZESTRIL) 20 MG tablet TAKE 2 TABLETS (  40 MG TOTAL) BY MOUTH DAILY.  Marland Kitchen morphine (MS CONTIN) 60 MG 12 hr tablet Take 1 tablet (60 mg total) by mouth 2 (two) times daily.  Marland Kitchen morphine (MSIR) 30 MG tablet Take 1 tablet (30 mg total) by mouth every 4 (four) hours as needed (breakthrough pain).  . Olopatadine HCl 0.2 % SOLN Apply 1 drop to eye daily.  . ondansetron (ZOFRAN) 8 MG tablet TAKE 1 TABLET (8 MG TOTAL) BY MOUTH 2 (TWO) TIMES DAILY AS NEEDED FOR NAUSEA OR VOMITING.  . predniSONE (DELTASONE) 50 MG tablet Take 1 tablet (50 mg total) by mouth See admin instructions. Take 1 tablet (50 mg) 13 hours before CT scan, 7 hours before CT scan and 1 hour before CT scan (every 9 weeks)   No facility-administered encounter medications on file as of 07/27/2015.    Activities of Daily Living In your present state of health, do you have any difficulty performing the following  activities: 07/27/2015  Hearing? N  Vision? Y  Difficulty concentrating or making decisions? N  Walking or climbing stairs? N  Dressing or bathing? N  Doing errands, shopping? N  Preparing Food and eating ? N  Using the Toilet? N  In the past six months, have you accidently leaked urine? N  Do you have problems with loss of bowel control? N  Managing your Medications? N  Managing your Finances? N  Housekeeping or managing your Housekeeping? N    Patient Care Team: [name], MD as PCP - General    Assessment:     Hearing Screening   '125Hz'  '250Hz'  '500Hz'  '1000Hz'  '2000Hz'  '4000Hz'  '8000Hz'   Right ear:   40 40 40 40   Left ear:   40 0 40 0   Vision Screening Comments: Last vision exam in May 2017 @ [facility]   Exercise Activities and Dietary recommendations Current Exercise Habits: Home exercise routine, Type of exercise: walking, Time (Minutes): 30, Frequency (Times/Week): 1, Weekly Exercise (Minutes/Week): 30, Intensity: Mild, Exercise limited by: Other - see comments (patient has shortness of breath)  Goals    . Increase physical activity     Starting 07/27/15, I will attempt to walk for at least 30 min daily as tolerated.       Fall Risk Fall Risk  07/27/2015 01/18/2014 12/28/2013 12/06/2013 11/16/2013  Falls in the past year? No No No No No   Depression Screen PHQ 2/9 Scores 07/27/2015 04/26/2014  PHQ - 2 Score 1 6     Cognitive Testing MMSE - Mini Mental State Exam 07/27/2015  Orientation to time 5  Orientation to Place 5  Registration 3  Attention/ Calculation 0  Recall 2  Recall-comments pt was unable to recall 1 of 3 words  Language- name 2 objects 0  Language- repeat 1  Language- follow 3 step command 3  Language- read & follow direction 0  Write a sentence 0  Copy design 0  Total score 19   PLEASE NOTE: A Mini-Cog screen was completed. Maximum score is 20. A value of 0 denotes this part of Folstein MMSE was not completed or the patient failed this part of  the Mini-Cog screening.   Mini-Cog Screening Orientation to Time - Max 5 pts Orientation to Place - Max 5 pts Registration - Max 3 pts Recall - Max 3 pts Language Repeat - Max 1 pts Language Follow 3 Step Command - Max 3 pts   Immunization History  Administered Date(s) Administered  . Influenza Split 10/01/2011  .  Influenza Whole 11/08/2007, 12/07/2008  . Influenza,inj,Quad PF,36+ Mos 10/13/2012, 09/14/2013, 09/27/2014  . Pneumococcal Polysaccharide-23 10/16/2005, 02/12/2011  . Td 01/07/2002, 01/08/2008   Screening Tests Health Maintenance  Topic Date Due  . ZOSTAVAX  07/26/2016 (Originally 07/24/2012)  . MAMMOGRAM  07/27/2023 (Originally 06/21/2010)  . PAP SMEAR  07/27/2023 (Originally 07/24/1973)  . INFLUENZA VACCINE  08/08/2015  . HEMOGLOBIN A1C  01/27/2016  . FOOT EXAM  02/02/2016  . OPHTHALMOLOGY EXAM  05/21/2016  . TETANUS/TDAP  01/07/2018  . COLONOSCOPY  06/21/2018  . PNEUMOCOCCAL POLYSACCHARIDE VACCINE  Completed  . Hepatitis C Screening  Completed  . HIV Screening  Completed      Plan:     I have personally reviewed and addressed the Medicare Annual Wellness questionnaire and have noted the following in the patient's chart:  A. Medical and social history B. Use of alcohol, tobacco or illicit drugs  C. Current medications and supplements D. Functional ability and status E.  Nutritional status F.  Physical activity G. Advance directives H. List of other physicians I.  Hospitalizations, surgeries, and ER visits in previous 12 months J.  [place] to include hearing, vision, cognitive, depression L. Referrals and appointments - none  In addition, I have reviewed and discussed with patient certain preventive protocols, quality metrics, and best practice recommendations. A written personalized care plan for preventive services as well as general preventive health recommendations were provided to patient.  See attached scanned questionnaire for  additional information.   Signed,   Lindell Noe, MHA, BS, LPN Health Advisor

## 2015-07-27 NOTE — Progress Notes (Signed)
Pre visit review using our clinic review tool, if applicable. No additional management support is needed unless otherwise documented below in the visit note. 

## 2015-07-27 NOTE — Progress Notes (Signed)
PCP notes  Health maintenance:   Hep C screening - completed HIV screening - completed A1C - completed Mammogram - declined PAP smear - declined Shingles - postponed/will discuss with oncologist Eye exam - pt reported exam in May 2017  Abnormal screenings:  Depression - PHQ2: score 1 Mini-Cog score: 19/20 Hearing - failed  Patient concerns: None  Nurse concerns: None  Next PCP appt: 08/03/15 @ 1115

## 2015-07-28 LAB — HIV ANTIBODY (ROUTINE TESTING W REFLEX): HIV 1&2 Ab, 4th Generation: NONREACTIVE

## 2015-07-28 LAB — HEPATITIS C ANTIBODY: HCV Ab: NEGATIVE

## 2015-07-30 ENCOUNTER — Other Ambulatory Visit: Payer: Self-pay | Admitting: Internal Medicine

## 2015-08-01 ENCOUNTER — Other Ambulatory Visit: Payer: Self-pay | Admitting: *Deleted

## 2015-08-01 MED ORDER — ALPRAZOLAM 0.25 MG PO TABS: 0.2500 mg | ORAL_TABLET | Freq: Every evening | ORAL | 0 refills | Status: DC | PRN

## 2015-08-03 ENCOUNTER — Ambulatory Visit (INDEPENDENT_AMBULATORY_CARE_PROVIDER_SITE_OTHER): Payer: Medicare Other | Admitting: Family Medicine

## 2015-08-03 ENCOUNTER — Encounter: Payer: Self-pay | Admitting: Family Medicine

## 2015-08-03 VITALS — BP 110/70 | HR 95 | Temp 98.2°F | Ht 63.0 in | Wt 143.5 lb

## 2015-08-03 DIAGNOSIS — E119 Type 2 diabetes mellitus without complications: Secondary | ICD-10-CM

## 2015-08-03 DIAGNOSIS — E785 Hyperlipidemia, unspecified: Secondary | ICD-10-CM

## 2015-08-03 DIAGNOSIS — C3490 Malignant neoplasm of unspecified part of unspecified bronchus or lung: Secondary | ICD-10-CM

## 2015-08-03 DIAGNOSIS — I639 Cerebral infarction, unspecified: Secondary | ICD-10-CM | POA: Diagnosis not present

## 2015-08-03 DIAGNOSIS — I10 Essential (primary) hypertension: Secondary | ICD-10-CM

## 2015-08-03 DIAGNOSIS — C7931 Secondary malignant neoplasm of brain: Secondary | ICD-10-CM

## 2015-08-03 LAB — HM DIABETES FOOT EXAM

## 2015-08-03 NOTE — Assessment & Plan Note (Signed)
Well controlled. Continue current medication.  

## 2015-08-03 NOTE — Assessment & Plan Note (Signed)
Resolving symptoms, speech now significantly improved.

## 2015-08-03 NOTE — Progress Notes (Signed)
65 year old female with metastatic lung cancer presents for AMW part 2. Reviewed Part 1  Nurse notes:  No Recs.   On treatment #138  She wa admitted from April 9 to 12 2016 after ER visit for slurred speech and confusion. 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 Although improved,continued to have nonsensical speech at times.Transthoracic echocardiogram and trans-esophageal Echo negative for a embolic etiology. Carotid Doppler did not show significant stenosis. 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  Diabetes:   Improved control on no med. Lab Results  Component Value Date   HGBA1C 6.1 07/27/2015  Using medications without difficulties: on none Hypoglycemic episodes: not chekcing Hyperglycemic episodes: not checking Feet problems: no ulcers Blood Sugars averaging: ? eye exam within last year : 05/2015   Elevated Cholesterol: At goal LDL. Lipitor was increased to 40 mg daily.  CVA goal < 70 in 04/2014    Lab Results  Component Value Date   CHOL 114 01/26/2015   HDL 30.50 (L) 01/26/2015   LDLCALC 61 01/26/2015   LDLDIRECT 105.5 08/06/2013   TRIG 110.0 01/26/2015   CHOLHDL 4 01/26/2015  Using medications without problems: no SE. Muscle aches:  None Minimal exercise, good diet. Other complaints:  Hypertension:     At goal on amlodipine, lisinopril  BP Readings from Last 3 Encounters:  08/03/15 110/70  07/27/15 94/60  07/18/15 (!) 146/56  Using medication without problems or lightheadedness:  Occ Chest pain with exertion: None Edema: None Short of breath: Stable. Average home BPs: Other issues:  Social History /Family History/Past Medical History reviewed and updated if needed.  Review of Systems  Constitutional: Negative for fever and fatigue.  HENT: Negative for ear pain.  Eyes: Negative for pain.  Respiratory: Negative for chest tightness and shortness  of breath.  Cardiovascular: Negative for chest pain, palpitations and leg swelling.  Gastrointestinal: Negative for abdominal pain.  Genitourinary: Negative for dysuria.       Objective:   Physical Exam  Constitutional: Vital signs are normal. She appears well-developed and well-nourished. She is cooperative. Non-toxic appearance. She does not appear ill. No distress.  HENT:  Head: Normocephalic.  Right Ear: Hearing, tympanic membrane, external ear and ear canal normal. Tympanic membrane is not erythematous, not retracted and not bulging.  Left Ear: Hearing, tympanic membrane, external ear and ear canal normal. Tympanic membrane is not erythematous, not retracted and not bulging.  Nose: No mucosal edema or rhinorrhea. Right sinus exhibits no maxillary sinus tenderness and no frontal sinus tenderness. Left sinus exhibits no maxillary sinus tenderness and no frontal sinus tenderness.  Mouth/Throat: Uvula is midline, oropharynx is clear and moist and mucous membranes are normal.  Eyes: Conjunctivae, EOM and lids are normal. Pupils are equal, round, and reactive to light. Lids are everted and swept, no foreign bodies found.  Neck: Trachea normal and normal range of motion. Neck supple. Carotid bruit is not present. No thyroid mass and no thyromegaly present.  Cardiovascular: Normal rate, regular rhythm, S1 normal, S2 normal, normal heart sounds, intact distal pulses and normal pulses. Exam reveals no gallop and no friction rub.  No murmur heard. Pulmonary/Chest: Effort normal. No tachypnea. No respiratory distress. She has decreased breath sounds. She has no wheezes. She has no rhonchi. She has no rales.  Unchanged from previous exam  Abdominal: Soft. Normal appearance and bowel sounds are normal. There is no tenderness.  Neurological:  She is alert.  Skin: Skin is warm, dry and intact. No rash noted.  Psychiatric: Her speech is normal and behavior is normal. Judgment and thought  content normal. Her mood appears not anxious. Cognition and memory are normal. She does not exhibit a depressed mood.   Diabetic foot exam: Normal inspection No skin breakdown No calluses  Normal DP pulses Normal sensation to light touch and monofilament Nails normal        Health maintenance:   Hep C screening - completed HIV screening - completed A1C - completed Mammogram/ breast exam - declined PAP smear - declined Shingles - contraindicated Eye exam - pt reported exam in May 2017 Colonoscopy: declined

## 2015-08-03 NOTE — Assessment & Plan Note (Signed)
Stable on scanning.

## 2015-08-03 NOTE — Progress Notes (Signed)
Pre visit review using our clinic review tool, if applicable. No additional management support is needed unless otherwise documented below in the visit note. 

## 2015-08-03 NOTE — Assessment & Plan Note (Signed)
Stable control on no med. Encouraged exercise, weight loss, healthy eating habits.

## 2015-08-03 NOTE — Assessment & Plan Note (Signed)
On chemo longterm. Followed by ONC.

## 2015-08-08 ENCOUNTER — Telehealth: Payer: Self-pay | Admitting: Internal Medicine

## 2015-08-08 ENCOUNTER — Encounter: Payer: Self-pay | Admitting: Internal Medicine

## 2015-08-08 ENCOUNTER — Ambulatory Visit (HOSPITAL_BASED_OUTPATIENT_CLINIC_OR_DEPARTMENT_OTHER): Payer: Medicare Other | Admitting: Internal Medicine

## 2015-08-08 ENCOUNTER — Ambulatory Visit (HOSPITAL_BASED_OUTPATIENT_CLINIC_OR_DEPARTMENT_OTHER): Payer: Medicare Other

## 2015-08-08 ENCOUNTER — Other Ambulatory Visit (HOSPITAL_BASED_OUTPATIENT_CLINIC_OR_DEPARTMENT_OTHER): Payer: Medicare Other

## 2015-08-08 VITALS — BP 132/64 | HR 88 | Temp 97.8°F | Resp 18 | Wt 144.1 lb

## 2015-08-08 DIAGNOSIS — Z5111 Encounter for antineoplastic chemotherapy: Secondary | ICD-10-CM | POA: Diagnosis not present

## 2015-08-08 DIAGNOSIS — C3411 Malignant neoplasm of upper lobe, right bronchus or lung: Secondary | ICD-10-CM

## 2015-08-08 DIAGNOSIS — C3491 Malignant neoplasm of unspecified part of right bronchus or lung: Secondary | ICD-10-CM

## 2015-08-08 LAB — CBC WITH DIFFERENTIAL/PLATELET
BASO%: 0.5 % (ref 0.0–2.0)
BASOS ABS: 0 10*3/uL (ref 0.0–0.1)
EOS ABS: 0.1 10*3/uL (ref 0.0–0.5)
EOS%: 1.5 % (ref 0.0–7.0)
HEMATOCRIT: 36.3 % (ref 34.8–46.6)
HEMOGLOBIN: 11.9 g/dL (ref 11.6–15.9)
LYMPH#: 1.9 10*3/uL (ref 0.9–3.3)
LYMPH%: 24.1 % (ref 14.0–49.7)
MCH: 30.9 pg (ref 25.1–34.0)
MCHC: 32.7 g/dL (ref 31.5–36.0)
MCV: 94.7 fL (ref 79.5–101.0)
MONO#: 1 10*3/uL — AB (ref 0.1–0.9)
MONO%: 12 % (ref 0.0–14.0)
NEUT#: 5 10*3/uL (ref 1.5–6.5)
NEUT%: 61.9 % (ref 38.4–76.8)
Platelets: 316 10*3/uL (ref 145–400)
RBC: 3.83 10*6/uL (ref 3.70–5.45)
RDW: 16.5 % — AB (ref 11.2–14.5)
WBC: 8.1 10*3/uL (ref 3.9–10.3)

## 2015-08-08 LAB — COMPREHENSIVE METABOLIC PANEL
ALBUMIN: 3.4 g/dL — AB (ref 3.5–5.0)
ALK PHOS: 97 U/L (ref 40–150)
ALT: 10 U/L (ref 0–55)
AST: 17 U/L (ref 5–34)
Anion Gap: 8 mEq/L (ref 3–11)
BUN: 12.4 mg/dL (ref 7.0–26.0)
CALCIUM: 9.9 mg/dL (ref 8.4–10.4)
CO2: 26 mEq/L (ref 22–29)
CREATININE: 1 mg/dL (ref 0.6–1.1)
Chloride: 107 mEq/L (ref 98–109)
EGFR: 61 mL/min/{1.73_m2} — ABNORMAL LOW (ref >90)
Glucose: 143 mg/dl — ABNORMAL HIGH (ref 70–140)
POTASSIUM: 4.4 meq/L (ref 3.5–5.1)
Sodium: 141 mEq/L (ref 136–145)
Total Bilirubin: 0.38 mg/dL (ref 0.20–1.20)
Total Protein: 7.1 g/dL (ref 6.4–8.3)

## 2015-08-08 MED ORDER — SODIUM CHLORIDE 0.9 % IV SOLN
500.0000 mg/m2 | Freq: Once | INTRAVENOUS | Status: AC
  Administered 2015-08-08: 900 mg via INTRAVENOUS
  Filled 2015-08-08: qty 20

## 2015-08-08 MED ORDER — SODIUM CHLORIDE 0.9 % IV SOLN
Freq: Once | INTRAVENOUS | Status: AC
  Administered 2015-08-08: 10:00:00 via INTRAVENOUS

## 2015-08-08 MED ORDER — SODIUM CHLORIDE 0.9 % IJ SOLN
10.0000 mL | INTRAMUSCULAR | Status: DC | PRN
  Administered 2015-08-08: 10 mL
  Filled 2015-08-08: qty 10

## 2015-08-08 MED ORDER — HEPARIN SOD (PORK) LOCK FLUSH 100 UNIT/ML IV SOLN
500.0000 [IU] | Freq: Once | INTRAVENOUS | Status: AC | PRN
  Administered 2015-08-08: 500 [IU]
  Filled 2015-08-08: qty 5

## 2015-08-08 MED ORDER — CYANOCOBALAMIN 1000 MCG/ML IJ SOLN
INTRAMUSCULAR | Status: AC
  Filled 2015-08-08: qty 1

## 2015-08-08 MED ORDER — CYANOCOBALAMIN 1000 MCG/ML IJ SOLN
1000.0000 ug | Freq: Once | INTRAMUSCULAR | Status: AC
  Administered 2015-08-08: 1000 ug via INTRAMUSCULAR

## 2015-08-08 MED ORDER — SODIUM CHLORIDE 0.9 % IV SOLN
Freq: Once | INTRAVENOUS | Status: AC
  Administered 2015-08-08: 11:00:00 via INTRAVENOUS
  Filled 2015-08-08: qty 4

## 2015-08-08 NOTE — Progress Notes (Signed)
[place] Telephone:(336) [phone]   [phone]) [phone]  OFFICE PROGRESS NOTE  [name], MD [place] [address]  Diagnosis:  Metastatic non-small cell lung cancer initially diagnosed with locally advanced stage IIIB with right Pancoast tumor involving the vertebral body as well as the foraminal canal invasion with spinal cord compression in October of 2007.   Prior Therapy:  1. Status post concurrent chemoradiation with weekly carboplatin and paclitaxel, last dose was given November 18, 2005. 2. 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 3. Status post gamma knife radiotherapy to a solitary brain lesion located in the superior frontal area of the brain at [facility] in April of 2008. 4. Status post palliative radiotherapy to the lateral abdominal wall metastatic lesion under the care of Dr. Lisbeth Renshaw, completed March of 2009. 5. Status post 6 cycles of systemic chemotherapy with carboplatin and Alimta. Last dose was given July 26, 2007 with disease stabilization. 6. Gamma knife stereotactic radiotherapy to 2 brain lesions one involving the right frontal dural based as well as right parietal lesion performed on 05/07/2012 under the care of Dr. Vallarie Mare at [facility].  Current Therapy:  Maintenance chemotherapy with Alimta 500 mg per meter squared given every 3 weeks. The patient is status post 137 cycles.   CHEMOTHERAPY INTENT: Palliative/maintenance  CURRENT # OF CHEMOTHERAPY CYCLES: 138 CURRENT ANTIEMETICS: Zofran, dexamethasone and Compazine  CURRENT SMOKING STATUS: Quit smoking 10/07/2005  ORAL CHEMOTHERAPY AND CONSENT: None  CURRENT BISPHOSPHONATES USE: None  PAIN MANAGEMENT: 0/10 currently on morphine  NARCOTICS INDUCED CONSTIPATION: No constipation  LIVING WILL AND CODE STATUS: No CODE BLUE  INTERVAL HISTORY: Danielle [name] 65 y.o. female  returns to the clinic today for followup visit accompanied by her sister. The patient is feeling fine today with no specific complaints. She continues to tolerate her treatment with Alimta fairly well. She denied having any significant fever or chills. The patient denied having any chest pain, shortness of breath, or hemoptysis. No nausea or vomiting. The patient denied having any weight loss or night sweats. She is here to start cycle 138.  MEDICAL HISTORY: Past Medical History:  Diagnosis Date  . Anemia   . CVA (cerebral vascular accident) ([place]) 04/2014   pt states had 2 cva's within 2 wks  . DJD (degenerative joint disease), cervical   . Fibromyalgia   . H/O: pneumonia   . History of tobacco abuse quit 10/08   on nicotine patch  . Hypertension   . Hypokalemia   . Lung cancer ([place]) dx'd 09/2005   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 had chemo and radiation for lung ca  . Thrush 12/11/2010    ALLERGIES:  is allergic to contrast media [iodinated diagnostic agents]; iohexol; sulfa drugs cross reactors; and co-trimoxazole injection [sulfamethoxazole-trimethoprim].  MEDICATIONS:  Current Outpatient Prescriptions  Medication Sig Dispense Refill  . ALPRAZolam (XANAX) 0.25 MG tablet Take 1 tablet (0.25 mg total) by mouth at bedtime as needed. for sleep 30 tablet 0  . amLODipine (NORVASC) 10 MG tablet TAKE 1 TABLET (10 MG TOTAL) BY MOUTH DAILY. 90 tablet 1  . aspirin EC 325 MG tablet Take 1 tablet (325 mg total) by mouth daily. 90 tablet 0  . atorvastatin (LIPITOR) 40 MG tablet TAKE 1 TABLET BY MOUTH EVERY DAY AT 6 PM 90 tablet 2  . fluticasone (FLONASE) 50 MCG/ACT nasal spray Place 1-2 sprays into  both nostrils daily as needed.     . folic acid (FOLVITE) 1 MG tablet TAKE 1 TABLET BY MOUTH DAILY. 90 tablet 1  . furosemide (LASIX) 20 MG tablet TAKE 1 TABLET (20 MG TOTAL) BY MOUTH DAILY AS NEEDED FOR EDEMA. 20 tablet 0  . lidocaine-prilocaine (EMLA) cream Apply to Port-A-Cath as directed 30  g 1  . lisinopril (PRINIVIL,ZESTRIL) 20 MG tablet TAKE 2 TABLETS (40 MG TOTAL) BY MOUTH DAILY. 60 tablet 5  . morphine (MS CONTIN) 60 MG 12 hr tablet Take 1 tablet (60 mg total) by mouth 2 (two) times daily. 60 tablet 0  . morphine (MSIR) 30 MG tablet Take 1 tablet (30 mg total) by mouth every 4 (four) hours as needed (breakthrough pain). 60 tablet 0  . Olopatadine HCl 0.2 % SOLN Apply 1 drop to eye daily. 2.5 mL 11  . ondansetron (ZOFRAN) 8 MG tablet TAKE 1 TABLET (8 MG TOTAL) BY MOUTH 2 (TWO) TIMES DAILY AS NEEDED FOR NAUSEA OR VOMITING. 30 tablet 2  . predniSONE (DELTASONE) 50 MG tablet Take 1 tablet (50 mg total) by mouth See admin instructions. Take 1 tablet (50 mg) 13 hours before CT scan, 7 hours before CT scan and 1 hour before CT scan (every 9 weeks) 3 tablet 1   No current facility-administered medications for this visit.     SURGICAL HISTORY:  Past Surgical History:  Procedure Laterality Date  . CERVICAL LAMINECTOMY  1995  . KNEE SURGERY  1990   Left x 2  . 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 LOOP RECORDER IMPLANT N/A 04/19/2014   Procedure: LOOP RECORDER IMPLANT;  Surgeon: Thompson Grayer, MD;  Location: [facility] CATH LAB;  Service: Cardiovascular;  Laterality: N/A;  . OTHER SURGICAL HISTORY  2008   Gamma knife surgery to remove brain met   . PORTACATH PLACEMENT  -[name]   TIP IN CAVOATRIAL JUNCTION  . TEE WITHOUT CARDIOVERSION N/A 04/19/2014   Procedure: TRANSESOPHAGEAL ECHOCARDIOGRAM (TEE);  Surgeon: Larey Dresser, MD;  Location: [facility] ENDOSCOPY;  Service: Cardiovascular;  Laterality: N/A;    REVIEW OF SYSTEMS:  A comprehensive review of systems was negative.   PHYSICAL EXAMINATION: General appearance: alert, cooperative and no distress Head: Normocephalic, without obvious abnormality, atraumatic Neck: no adenopathy, no JVD, supple, symmetrical, trachea midline and thyroid not enlarged, symmetric, no tenderness/mass/nodules Lymph nodes: Cervical,  supraclavicular, and axillary nodes normal. Resp: clear to auscultation bilaterally Back: symmetric, no curvature. ROM normal. No CVA tenderness. Cardio: regular rate and rhythm, S1, S2 normal, no murmur, click, rub or gallop GI: soft, non-tender; bowel sounds normal; no masses,  no organomegaly Extremities: extremities normal, atraumatic, no cyanosis or edema Neurologic: Alert and oriented X 3, normal strength and tone. Normal symmetric reflexes. Normal coordination and gait  ECOG PERFORMANCE STATUS: 0 - Asymptomatic  Blood pressure 132/64, pulse 88, temperature 97.8 F (36.6 C), temperature source Oral, resp. rate 18, weight 144 lb 1.6 oz (65.4 kg), SpO2 100 %.  LABORATORY DATA: Lab Results  Component Value Date   WBC 8.1 08/08/2015   HGB 11.9 08/08/2015   HCT 36.3 08/08/2015   MCV 94.7 08/08/2015   PLT 316 08/08/2015      Chemistry      Component Value Date/Time   NA 141 08/08/2015 0906   K 4.4 08/08/2015 0906   CL 103 01/26/2015 0818   CL 106 06/30/2012 1004   CO2 26 08/08/2015 0906   BUN 12.4  08/08/2015 0906   CREATININE 1.0 08/08/2015 0906      Component Value Date/Time   CALCIUM 9.9 08/08/2015 0906   ALKPHOS 97 08/08/2015 0906   AST 17 08/08/2015 0906   ALT 10 08/08/2015 0906   BILITOT 0.38 08/08/2015 0906       RADIOGRAPHIC STUDIES: No results found. ASSESSMENT AND PLAN: This is a very pleasant 65 years old white female with metastatic non-small cell lung cancer currently undergoing maintenance chemotherapy with single agent Alimta status post 137 cycles. The patient is tolerating her treatment fairly well with no significant adverse effects.  I recommended for her to continue with her maintenance chemotherapy and the patient will receive cycle #138 today. For pain management, she will continue on her current pain medication with MS Contin and MS IR. The patient would come back for follow-up visit in 3 weeks for reevaluation before starting cycle #139. She  was advised to call immediately if she has any concerning symptoms in the interval. The patient voices understanding of current disease status and treatment options and is in agreement with the current care plan.  All questions were answered. The patient knows to call the clinic with any problems, questions or concerns. We can certainly see the patient much sooner if necessary.  Disclaimer: This note was dictated with voice recognition software. Similar sounding words can inadvertently be transcribed and may not be corrected upon review.

## 2015-08-08 NOTE — Telephone Encounter (Signed)
Pt will p/u updated sched in tx room

## 2015-08-08 NOTE — Patient Instructions (Addendum)
[facility] Discharge Instructions for Patients Receiving Chemotherapy  Today you received the following chemotherapy agents Alimta  To help prevent nausea and vomiting after your treatment, we encourage you to take your nausea medication   If you develop nausea and vomiting that is not controlled by your nausea medication, call the clinic.   BELOW ARE SYMPTOMS THAT SHOULD BE REPORTED IMMEDIATELY:  *FEVER GREATER THAN 100.5 F  *CHILLS WITH OR WITHOUT FEVER  NAUSEA AND VOMITING THAT IS NOT CONTROLLED WITH YOUR NAUSEA MEDICATION  *UNUSUAL SHORTNESS OF BREATH  *UNUSUAL BRUISING OR BLEEDING  TENDERNESS IN MOUTH AND THROAT WITH OR WITHOUT PRESENCE OF ULCERS  *URINARY PROBLEMS  *BOWEL PROBLEMS  UNUSUAL RASH Items with * indicate a potential emergency and should be followed up as soon as possible.  Feel free to call the clinic you have any questions or concerns. The clinic phone number is [phone].  Please show the [facility] at check-in to the Emergency Department and triage nurse.  Cyanocobalamin, Vitamin B12 injection What is this medicine? CYANOCOBALAMIN (sye an oh koe BAL a min) is a man made form of vitamin B12. Vitamin B12 is used in the growth of healthy blood cells, nerve cells, and proteins in the body. It also helps with the metabolism of fats and carbohydrates. This medicine is used to treat people who can not absorb vitamin B12. This medicine may be used for other purposes; ask your health care provider or pharmacist if you have questions. What should I tell my health care provider before I take this medicine? They need to know if you have any of these conditions: -kidney disease -Leber's disease -megaloblastic anemia -an unusual or allergic reaction to cyanocobalamin, cobalt, other medicines, foods, dyes, or preservatives -pregnant or trying to get pregnant -breast-feeding How should I use this medicine? This medicine is injected into a  muscle or deeply under the skin. It is usually given by a health care professional in a clinic or doctor's office. However, your doctor may teach you how to inject yourself. Follow all instructions. Talk to your pediatrician regarding the use of this medicine in children. Special care may be needed. Overdosage: If you think you have taken too much of this medicine contact a poison control center or emergency room at once. NOTE: This medicine is only for you. Do not share this medicine with others. What if I miss a dose? If you are given your dose at a clinic or doctor's office, call to reschedule your appointment. If you give your own injections and you miss a dose, take it as soon as you can. If it is almost time for your next dose, take only that dose. Do not take double or extra doses. What may interact with this medicine? -colchicine -heavy alcohol intake This list may not describe all possible interactions. Give your health care provider a list of all the medicines, herbs, non-prescription drugs, or dietary supplements you use. Also tell them if you smoke, drink alcohol, or use illegal drugs. Some items may interact with your medicine. What should I watch for while using this medicine? Visit your doctor or health care professional regularly. You may need blood work done while you are taking this medicine. You may need to follow a special diet. Talk to your doctor. Limit your alcohol intake and avoid smoking to get the best benefit. What side effects may I notice from receiving this medicine? Side effects that you should report to your doctor or health  care professional as soon as possible: -allergic reactions like skin rash, itching or hives, swelling of the face, lips, or tongue -blue tint to skin -chest tightness, pain -difficulty breathing, wheezing -dizziness -red, swollen painful area on the leg Side effects that usually do not require medical attention (report to your doctor or health  care professional if they continue or are bothersome): -diarrhea -headache This list may not describe all possible side effects. Call your doctor for medical advice about side effects. You may report side effects to FDA at 1-800-FDA-1088. Where should I keep my medicine? Keep out of the reach of children. Store at room temperature between 15 and 30 degrees C (59 and 85 degrees F). Protect from light. Throw away any unused medicine after the expiration date. NOTE: This sheet is a summary. It may not cover all possible information. If you have questions about this medicine, talk to your doctor, pharmacist, or health care provider.    2016, Elsevier/Gold Standard. (2007-04-06 22:10:20)

## 2015-08-14 ENCOUNTER — Ambulatory Visit (INDEPENDENT_AMBULATORY_CARE_PROVIDER_SITE_OTHER): Payer: Medicare Other | Admitting: *Deleted

## 2015-08-14 ENCOUNTER — Other Ambulatory Visit: Payer: Self-pay | Admitting: *Deleted

## 2015-08-14 DIAGNOSIS — C3410 Malignant neoplasm of upper lobe, unspecified bronchus or lung: Secondary | ICD-10-CM

## 2015-08-14 DIAGNOSIS — I639 Cerebral infarction, unspecified: Secondary | ICD-10-CM

## 2015-08-14 DIAGNOSIS — C3490 Malignant neoplasm of unspecified part of unspecified bronchus or lung: Secondary | ICD-10-CM

## 2015-08-14 MED ORDER — MORPHINE SULFATE ER 60 MG PO TBCR: 60.0000 mg | EXTENDED_RELEASE_TABLET | Freq: Two times a day (BID) | ORAL | 0 refills | Status: DC

## 2015-08-14 MED ORDER — MORPHINE SULFATE 30 MG PO TABS: 30.0000 mg | ORAL_TABLET | ORAL | 0 refills | Status: DC | PRN

## 2015-08-14 NOTE — Progress Notes (Signed)
[facility] Summary Report / Loop Recorder 

## 2015-08-21 LAB — CUP PACEART REMOTE DEVICE CHECK: Date Time Interrogation Session: 20170805223740

## 2015-08-29 ENCOUNTER — Telehealth: Payer: Self-pay | Admitting: Internal Medicine

## 2015-08-29 ENCOUNTER — Encounter: Payer: Self-pay | Admitting: Internal Medicine

## 2015-08-29 ENCOUNTER — Other Ambulatory Visit: Payer: Self-pay | Admitting: Medical Oncology

## 2015-08-29 ENCOUNTER — Ambulatory Visit (HOSPITAL_BASED_OUTPATIENT_CLINIC_OR_DEPARTMENT_OTHER): Payer: Medicare Other | Admitting: Internal Medicine

## 2015-08-29 ENCOUNTER — Other Ambulatory Visit (HOSPITAL_BASED_OUTPATIENT_CLINIC_OR_DEPARTMENT_OTHER): Payer: Medicare Other

## 2015-08-29 ENCOUNTER — Ambulatory Visit (HOSPITAL_BASED_OUTPATIENT_CLINIC_OR_DEPARTMENT_OTHER): Payer: Medicare Other

## 2015-08-29 VITALS — BP 114/53 | HR 70 | Temp 98.1°F | Resp 18 | Ht 63.0 in | Wt 142.4 lb

## 2015-08-29 DIAGNOSIS — C3411 Malignant neoplasm of upper lobe, right bronchus or lung: Secondary | ICD-10-CM

## 2015-08-29 DIAGNOSIS — C3491 Malignant neoplasm of unspecified part of right bronchus or lung: Secondary | ICD-10-CM

## 2015-08-29 DIAGNOSIS — C7931 Secondary malignant neoplasm of brain: Secondary | ICD-10-CM | POA: Diagnosis not present

## 2015-08-29 DIAGNOSIS — Z5111 Encounter for antineoplastic chemotherapy: Secondary | ICD-10-CM

## 2015-08-29 LAB — CBC WITH DIFFERENTIAL/PLATELET
BASO%: 0.5 % (ref 0.0–2.0)
Basophils Absolute: 0 10*3/uL (ref 0.0–0.1)
EOS%: 1.4 % (ref 0.0–7.0)
Eosinophils Absolute: 0.1 10*3/uL (ref 0.0–0.5)
HEMATOCRIT: 34 % — AB (ref 34.8–46.6)
HGB: 11.2 g/dL — ABNORMAL LOW (ref 11.6–15.9)
LYMPH#: 2.1 10*3/uL (ref 0.9–3.3)
LYMPH%: 23.7 % (ref 14.0–49.7)
MCH: 31.4 pg (ref 25.1–34.0)
MCHC: 33 g/dL (ref 31.5–36.0)
MCV: 95.1 fL (ref 79.5–101.0)
MONO#: 1 10*3/uL — AB (ref 0.1–0.9)
MONO%: 10.7 % (ref 0.0–14.0)
NEUT%: 63.7 % (ref 38.4–76.8)
NEUTROS ABS: 5.8 10*3/uL (ref 1.5–6.5)
PLATELETS: 458 10*3/uL — AB (ref 145–400)
RBC: 3.57 10*6/uL — ABNORMAL LOW (ref 3.70–5.45)
RDW: 16.1 % — AB (ref 11.2–14.5)
WBC: 9.1 10*3/uL (ref 3.9–10.3)

## 2015-08-29 LAB — COMPREHENSIVE METABOLIC PANEL
ALT: 10 U/L (ref 0–55)
ANION GAP: 9 meq/L (ref 3–11)
AST: 22 U/L (ref 5–34)
Albumin: 3.3 g/dL — ABNORMAL LOW (ref 3.5–5.0)
Alkaline Phosphatase: 88 U/L (ref 40–150)
BILIRUBIN TOTAL: 0.35 mg/dL (ref 0.20–1.20)
BUN: 14.8 mg/dL (ref 7.0–26.0)
CALCIUM: 9.9 mg/dL (ref 8.4–10.4)
CO2: 25 meq/L (ref 22–29)
CREATININE: 1.1 mg/dL (ref 0.6–1.1)
Chloride: 108 mEq/L (ref 98–109)
EGFR: 55 mL/min/{1.73_m2} — ABNORMAL LOW (ref >90)
Glucose: 129 mg/dl (ref 70–140)
Potassium: 4.6 mEq/L (ref 3.5–5.1)
Sodium: 142 mEq/L (ref 136–145)
TOTAL PROTEIN: 7 g/dL (ref 6.4–8.3)

## 2015-08-29 MED ORDER — SODIUM CHLORIDE 0.9 % IV SOLN
Freq: Once | INTRAVENOUS | Status: AC
  Administered 2015-08-29: 11:00:00 via INTRAVENOUS

## 2015-08-29 MED ORDER — SODIUM CHLORIDE 0.9 % IJ SOLN
10.0000 mL | INTRAMUSCULAR | Status: DC | PRN
  Administered 2015-08-29: 10 mL
  Filled 2015-08-29: qty 10

## 2015-08-29 MED ORDER — HEPARIN SOD (PORK) LOCK FLUSH 100 UNIT/ML IV SOLN
500.0000 [IU] | Freq: Once | INTRAVENOUS | Status: AC | PRN
  Administered 2015-08-29: 500 [IU]
  Filled 2015-08-29: qty 5

## 2015-08-29 MED ORDER — PEMETREXED DISODIUM CHEMO INJECTION 500 MG
500.0000 mg/m2 | Freq: Once | INTRAVENOUS | Status: AC
  Administered 2015-08-29: 900 mg via INTRAVENOUS
  Filled 2015-08-29: qty 16

## 2015-08-29 MED ORDER — ALPRAZOLAM 0.25 MG PO TABS: 0.2500 mg | ORAL_TABLET | Freq: Every evening | ORAL | 0 refills | Status: DC | PRN

## 2015-08-29 MED ORDER — SODIUM CHLORIDE 0.9 % IV SOLN
Freq: Once | INTRAVENOUS | Status: AC
  Administered 2015-08-29: 11:00:00 via INTRAVENOUS
  Filled 2015-08-29: qty 4

## 2015-08-29 MED ORDER — PREDNISONE 50 MG PO TABS: 50.0000 mg | ORAL_TABLET | ORAL | 1 refills | Status: DC

## 2015-08-29 MED ORDER — DIPHENHYDRAMINE HCL 25 MG PO TABS: 25.0000 mg | ORAL_TABLET | Freq: Once | ORAL | 0 refills | Status: DC

## 2015-08-29 NOTE — Progress Notes (Signed)
[place] Telephone:(336) [phone]   [phone]) [phone]  OFFICE PROGRESS NOTE  [name], MD [place] [address]  Diagnosis:  Metastatic non-small cell lung cancer initially diagnosed with locally advanced stage IIIB with right Pancoast tumor involving the vertebral body as well as the foraminal canal invasion with spinal cord compression in October of 2007.   Prior Therapy:  1. Status post concurrent chemoradiation with weekly carboplatin and paclitaxel, last dose was given November 18, 2005. 2. 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 3. Status post gamma knife radiotherapy to a solitary brain lesion located in the superior frontal area of the brain at [facility] in April of 2008. 4. Status post palliative radiotherapy to the lateral abdominal wall metastatic lesion under the care of Dr. Lisbeth Renshaw, completed March of 2009. 5. Status post 6 cycles of systemic chemotherapy with carboplatin and Alimta. Last dose was given July 26, 2007 with disease stabilization. 6. Gamma knife stereotactic radiotherapy to 2 brain lesions one involving the right frontal dural based as well as right parietal lesion performed on 05/07/2012 under the care of Dr. Vallarie Mare at [facility].  Current Therapy:  Maintenance chemotherapy with Alimta 500 mg per meter squared given every 3 weeks. The patient is status post 138 cycles.   CHEMOTHERAPY INTENT: Palliative/maintenance  CURRENT # OF CHEMOTHERAPY CYCLES: 139 CURRENT ANTIEMETICS: Zofran, dexamethasone and Compazine  CURRENT SMOKING STATUS: Quit smoking 10/07/2005  ORAL CHEMOTHERAPY AND CONSENT: None  CURRENT BISPHOSPHONATES USE: None  PAIN MANAGEMENT: 0/10 currently on morphine  NARCOTICS INDUCED CONSTIPATION: No constipation  LIVING WILL AND CODE STATUS: No CODE BLUE  INTERVAL HISTORY: Danielle [name] 65 y.o. female  returns to the clinic today for followup visit. The patient is feeling fine today with no specific complaints. She continues to tolerate her treatment with Alimta fairly well. She denied having any significant fever or chills. The patient denied having any chest pain, shortness of breath, cough or hemoptysis. No nausea or vomiting. The patient denied having any weight loss or night sweats. She is here to start cycle 139.  MEDICAL HISTORY: Past Medical History:  Diagnosis Date  . Anemia   . CVA (cerebral vascular accident) ([place]) 04/2014   pt states had 2 cva's within 2 wks  . DJD (degenerative joint disease), cervical   . Fibromyalgia   . H/O: pneumonia   . History of tobacco abuse quit 10/08   on nicotine patch  . Hypertension   . Hypokalemia   . Lung cancer ([place]) dx'd 09/2005   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 had chemo and radiation for lung ca  . Thrush 12/11/2010    ALLERGIES:  is allergic to contrast media [iodinated diagnostic agents]; iohexol; sulfa drugs cross reactors; and co-trimoxazole injection [sulfamethoxazole-trimethoprim].  MEDICATIONS:  Current Outpatient Prescriptions  Medication Sig Dispense Refill  . amLODipine (NORVASC) 10 MG tablet TAKE 1 TABLET (10 MG TOTAL) BY MOUTH DAILY. 90 tablet 1  . aspirin EC 325 MG tablet Take 1 tablet (325 mg total) by mouth daily. 90 tablet 0  . atorvastatin (LIPITOR) 40 MG tablet TAKE 1 TABLET BY MOUTH EVERY DAY AT 6 PM 90 tablet 2  . fluticasone (FLONASE) 50 MCG/ACT nasal spray Place 1-2 sprays into both nostrils daily as needed.     . folic acid (FOLVITE) 1 MG tablet TAKE 1 TABLET BY MOUTH DAILY. 90 tablet 1  .  furosemide (LASIX) 20 MG tablet TAKE 1 TABLET (20 MG TOTAL) BY MOUTH DAILY AS NEEDED FOR EDEMA. 20 tablet 0  . lidocaine-prilocaine (EMLA) cream Apply to Port-A-Cath as directed 30 g 1  . lisinopril (PRINIVIL,ZESTRIL) 20 MG tablet TAKE 2 TABLETS (40 MG TOTAL) BY MOUTH DAILY. 60 tablet 5  . morphine (MS CONTIN) 60 MG  12 hr tablet Take 1 tablet (60 mg total) by mouth 2 (two) times daily. 60 tablet 0  . morphine (MSIR) 30 MG tablet Take 1 tablet (30 mg total) by mouth every 4 (four) hours as needed (breakthrough pain). 60 tablet 0  . Olopatadine HCl 0.2 % SOLN Apply 1 drop to eye daily. 2.5 mL 11  . ondansetron (ZOFRAN) 8 MG tablet TAKE 1 TABLET (8 MG TOTAL) BY MOUTH 2 (TWO) TIMES DAILY AS NEEDED FOR NAUSEA OR VOMITING. 30 tablet 2  . predniSONE (DELTASONE) 50 MG tablet Take 1 tablet (50 mg total) by mouth See admin instructions. Take 1 tablet (50 mg) 13 hours before CT scan, 7 hours before CT scan and 1 hour before CT scan (every 9 weeks) 3 tablet 1  . ALPRAZolam (XANAX) 0.25 MG tablet Take 1 tablet (0.25 mg total) by mouth at bedtime as needed. for sleep 30 tablet 0   No current facility-administered medications for this visit.     SURGICAL HISTORY:  Past Surgical History:  Procedure Laterality Date  . CERVICAL LAMINECTOMY  1995  . KNEE SURGERY  1990   Left x 2  . 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 LOOP RECORDER IMPLANT N/A 04/19/2014   Procedure: LOOP RECORDER IMPLANT;  Surgeon: Thompson Grayer, MD;  Location: [facility] CATH LAB;  Service: Cardiovascular;  Laterality: N/A;  . OTHER SURGICAL HISTORY  2008   Gamma knife surgery to remove brain met   . PORTACATH PLACEMENT  -[name]   TIP IN CAVOATRIAL JUNCTION  . TEE WITHOUT CARDIOVERSION N/A 04/19/2014   Procedure: TRANSESOPHAGEAL ECHOCARDIOGRAM (TEE);  Surgeon: Larey Dresser, MD;  Location[facility] ENDOSCOPY;  Service: Cardiovascular;  Laterality: N/A;    REVIEW OF SYSTEMS:  A comprehensive review of systems was negative.   PHYSICAL EXAMINATION: General appearance: alert, cooperative and no distress Head: Normocephalic, without obvious abnormality, atraumatic Neck: no adenopathy, no JVD, supple, symmetrical, trachea midline and thyroid not enlarged, symmetric, no tenderness/mass/nodules Lymph nodes: Cervical, supraclavicular,  and axillary nodes normal. Resp: clear to auscultation bilaterally Back: symmetric, no curvature. ROM normal. No CVA tenderness. Cardio: regular rate and rhythm, S1, S2 normal, no murmur, click, rub or gallop GI: soft, non-tender; bowel sounds normal; no masses,  no organomegaly Extremities: extremities normal, atraumatic, no cyanosis or edema Neurologic: Alert and oriented X 3, normal strength and tone. Normal symmetric reflexes. Normal coordination and gait  ECOG PERFORMANCE STATUS: 0 - Asymptomatic  Blood pressure (!) 114/53, pulse 70, temperature 98.1 F (36.7 C), temperature source Oral, resp. rate 18, height '5\' 3"'  (1.6 m), weight 142 lb 6.4 oz (64.6 kg), SpO2 95 %.  LABORATORY DATA: Lab Results  Component Value Date   WBC 9.1 08/29/2015   HGB 11.2 (L) 08/29/2015   HCT 34.0 (L) 08/29/2015   MCV 95.1 08/29/2015   PLT 458 (H) 08/29/2015      Chemistry      Component Value Date/Time   NA 142 08/29/2015 0909   K 4.6 08/29/2015 0909   CL 103 01/26/2015 0818   CL 106 06/30/2012 1004   CO2 25  08/29/2015 0909   BUN 14.8 08/29/2015 0909   CREATININE 1.1 08/29/2015 0909      Component Value Date/Time   CALCIUM 9.9 08/29/2015 0909   ALKPHOS 88 08/29/2015 0909   AST 22 08/29/2015 0909   ALT 10 08/29/2015 0909   BILITOT 0.35 08/29/2015 0909       RADIOGRAPHIC STUDIES: No results found. ASSESSMENT AND PLAN: This is a very pleasant 65 years old white female with metastatic non-small cell lung cancer currently undergoing maintenance chemotherapy with single agent Alimta status post 138 cycles. The patient is tolerating her treatment fairly well with no significant adverse effects.  I recommended for her to continue with her maintenance chemotherapy and the patient will receive cycle #139 today. For pain management, she will continue on her current pain medication with MS Contin and MS IR. The patient would come back for follow-up visit in 3 weeks for reevaluation before  starting cycle #140 after repeating CT scan of the chest, abdomen and pelvis for restaging of her disease. She was given a refill of Zofran and Xanax today She was advised to call immediately if she has any concerning symptoms in the interval. The patient voices understanding of current disease status and treatment options and is in agreement with the current care plan.  All questions were answered. The patient knows to call the clinic with any problems, questions or concerns. We can certainly see the patient much sooner if necessary.  Disclaimer: This note was dictated with voice recognition software. Similar sounding words can inadvertently be transcribed and may not be corrected upon review.

## 2015-08-29 NOTE — Telephone Encounter (Signed)
Pt requests zofran refill - she has two refills so we will refill it.

## 2015-08-29 NOTE — Patient Instructions (Signed)
[facility] Discharge Instructions for Patients Receiving Chemotherapy  Today you received the following chemotherapy agents Alimta  To help prevent nausea and vomiting after your treatment, we encourage you to take your nausea medication   If you develop nausea and vomiting that is not controlled by your nausea medication, call the clinic.   BELOW ARE SYMPTOMS THAT SHOULD BE REPORTED IMMEDIATELY:  *FEVER GREATER THAN 100.5 F  *CHILLS WITH OR WITHOUT FEVER  NAUSEA AND VOMITING THAT IS NOT CONTROLLED WITH YOUR NAUSEA MEDICATION  *UNUSUAL SHORTNESS OF BREATH  *UNUSUAL BRUISING OR BLEEDING  TENDERNESS IN MOUTH AND THROAT WITH OR WITHOUT PRESENCE OF ULCERS  *URINARY PROBLEMS  *BOWEL PROBLEMS  UNUSUAL RASH Items with * indicate a potential emergency and should be followed up as soon as possible.  Feel free to call the clinic you have any questions or concerns. The clinic phone number is [phone].  Please show the [facility] at check-in to the Emergency Department and triage nurse.  Cyanocobalamin, Vitamin B12 injection What is this medicine? CYANOCOBALAMIN (sye an oh koe BAL a min) is a man made form of vitamin B12. Vitamin B12 is used in the growth of healthy blood cells, nerve cells, and proteins in the body. It also helps with the metabolism of fats and carbohydrates. This medicine is used to treat people who can not absorb vitamin B12. This medicine may be used for other purposes; ask your health care provider or pharmacist if you have questions. What should I tell my health care provider before I take this medicine? They need to know if you have any of these conditions: -kidney disease -Leber's disease -megaloblastic anemia -an unusual or allergic reaction to cyanocobalamin, cobalt, other medicines, foods, dyes, or preservatives -pregnant or trying to get pregnant -breast-feeding How should I use this medicine? This medicine is injected into a  muscle or deeply under the skin. It is usually given by a health care professional in a clinic or doctor's office. However, your doctor may teach you how to inject yourself. Follow all instructions. Talk to your pediatrician regarding the use of this medicine in children. Special care may be needed. Overdosage: If you think you have taken too much of this medicine contact a poison control center or emergency room at once. NOTE: This medicine is only for you. Do not share this medicine with others. What if I miss a dose? If you are given your dose at a clinic or doctor's office, call to reschedule your appointment. If you give your own injections and you miss a dose, take it as soon as you can. If it is almost time for your next dose, take only that dose. Do not take double or extra doses. What may interact with this medicine? -colchicine -heavy alcohol intake This list may not describe all possible interactions. Give your health care provider a list of all the medicines, herbs, non-prescription drugs, or dietary supplements you use. Also tell them if you smoke, drink alcohol, or use illegal drugs. Some items may interact with your medicine. What should I watch for while using this medicine? Visit your doctor or health care professional regularly. You may need blood work done while you are taking this medicine. You may need to follow a special diet. Talk to your doctor. Limit your alcohol intake and avoid smoking to get the best benefit. What side effects may I notice from receiving this medicine? Side effects that you should report to your doctor or health  care professional as soon as possible: -allergic reactions like skin rash, itching or hives, swelling of the face, lips, or tongue -blue tint to skin -chest tightness, pain -difficulty breathing, wheezing -dizziness -red, swollen painful area on the leg Side effects that usually do not require medical attention (report to your doctor or health  care professional if they continue or are bothersome): -diarrhea -headache This list may not describe all possible side effects. Call your doctor for medical advice about side effects. You may report side effects to FDA at 1-800-FDA-1088. Where should I keep my medicine? Keep out of the reach of children. Store at room temperature between 15 and 30 degrees C (59 and 85 degrees F). Protect from light. Throw away any unused medicine after the expiration date. NOTE: This sheet is a summary. It may not cover all possible information. If you have questions about this medicine, talk to your doctor, pharmacist, or health care provider.    2016, Elsevier/Gold Standard. (2007-04-06 22:10:20)

## 2015-08-29 NOTE — Telephone Encounter (Signed)
Gave patient avs report and appointments for September and October. Central radiology will call re scans - patient aware.

## 2015-08-29 NOTE — Telephone Encounter (Signed)
Called in ativan refill . 

## 2015-09-08 ENCOUNTER — Other Ambulatory Visit: Payer: Self-pay | Admitting: Medical Oncology

## 2015-09-08 DIAGNOSIS — C3410 Malignant neoplasm of upper lobe, unspecified bronchus or lung: Secondary | ICD-10-CM

## 2015-09-08 DIAGNOSIS — C3490 Malignant neoplasm of unspecified part of unspecified bronchus or lung: Secondary | ICD-10-CM

## 2015-09-08 MED ORDER — MORPHINE SULFATE 30 MG PO TABS: 30.0000 mg | ORAL_TABLET | ORAL | 0 refills | Status: DC | PRN

## 2015-09-08 MED ORDER — MORPHINE SULFATE ER 60 MG PO TBCR: 60.0000 mg | EXTENDED_RELEASE_TABLET | Freq: Two times a day (BID) | ORAL | 0 refills | Status: DC

## 2015-09-08 NOTE — Telephone Encounter (Signed)
Pt notified that rx refills ready for pick up

## 2015-09-08 NOTE — Addendum Note (Signed)
Addended by: Ardeen Garland on: 09/08/2015 03:59 PM   Modules accepted: Orders

## 2015-09-08 NOTE — Telephone Encounter (Addendum)
DR kale unable to sign for rx . Pt said she can wait until Tuesday for rx . Will get Mohamed to sign .

## 2015-09-12 ENCOUNTER — Ambulatory Visit (INDEPENDENT_AMBULATORY_CARE_PROVIDER_SITE_OTHER): Payer: Medicare Other | Admitting: *Deleted

## 2015-09-12 DIAGNOSIS — I639 Cerebral infarction, unspecified: Secondary | ICD-10-CM | POA: Diagnosis not present

## 2015-09-13 NOTE — Progress Notes (Signed)
[facility] Summary Report / Loop Recorder 

## 2015-09-15 ENCOUNTER — Encounter (HOSPITAL_COMMUNITY): Payer: Self-pay

## 2015-09-15 ENCOUNTER — Ambulatory Visit (HOSPITAL_COMMUNITY)
Admission: RE | Admit: 2015-09-15 | Discharge: 2015-09-15 | Disposition: A | Discharge status: Home / Self Care | Payer: Medicare Other | Source: Ambulatory Visit | Attending: Internal Medicine | Admitting: Internal Medicine

## 2015-09-15 DIAGNOSIS — R935 Abnormal findings on diagnostic imaging of other abdominal regions, including retroperitoneum: Secondary | ICD-10-CM | POA: Diagnosis not present

## 2015-09-15 DIAGNOSIS — C3411 Malignant neoplasm of upper lobe, right bronchus or lung: Secondary | ICD-10-CM | POA: Insufficient documentation

## 2015-09-15 DIAGNOSIS — I700 Atherosclerosis of aorta: Secondary | ICD-10-CM | POA: Insufficient documentation

## 2015-09-15 DIAGNOSIS — M25651 Stiffness of right hip, not elsewhere classified: Secondary | ICD-10-CM | POA: Diagnosis not present

## 2015-09-15 DIAGNOSIS — J439 Emphysema, unspecified: Secondary | ICD-10-CM | POA: Diagnosis not present

## 2015-09-15 DIAGNOSIS — C3491 Malignant neoplasm of unspecified part of right bronchus or lung: Secondary | ICD-10-CM

## 2015-09-15 DIAGNOSIS — M438X4 Other specified deforming dorsopathies, thoracic region: Secondary | ICD-10-CM | POA: Insufficient documentation

## 2015-09-15 DIAGNOSIS — M25652 Stiffness of left hip, not elsewhere classified: Secondary | ICD-10-CM | POA: Insufficient documentation

## 2015-09-15 DIAGNOSIS — Z5111 Encounter for antineoplastic chemotherapy: Secondary | ICD-10-CM

## 2015-09-15 MED ORDER — IOPAMIDOL (ISOVUE-300) INJECTION 61%
100.0000 mL | Freq: Once | INTRAVENOUS | Status: AC | PRN
  Administered 2015-09-15: 100 mL via INTRAVENOUS

## 2015-09-16 ENCOUNTER — Other Ambulatory Visit: Payer: Self-pay | Admitting: Internal Medicine

## 2015-09-19 ENCOUNTER — Ambulatory Visit (HOSPITAL_BASED_OUTPATIENT_CLINIC_OR_DEPARTMENT_OTHER): Payer: Medicare Other

## 2015-09-19 ENCOUNTER — Encounter: Payer: Self-pay | Admitting: Internal Medicine

## 2015-09-19 ENCOUNTER — Telehealth: Payer: Self-pay | Admitting: *Deleted

## 2015-09-19 ENCOUNTER — Ambulatory Visit (HOSPITAL_BASED_OUTPATIENT_CLINIC_OR_DEPARTMENT_OTHER): Payer: Medicare Other | Admitting: Internal Medicine

## 2015-09-19 ENCOUNTER — Telehealth: Payer: Self-pay | Admitting: Internal Medicine

## 2015-09-19 ENCOUNTER — Other Ambulatory Visit: Payer: Self-pay | Admitting: Internal Medicine

## 2015-09-19 ENCOUNTER — Other Ambulatory Visit (HOSPITAL_BASED_OUTPATIENT_CLINIC_OR_DEPARTMENT_OTHER): Payer: Medicare Other

## 2015-09-19 VITALS — BP 116/74 | HR 100 | Temp 98.2°F | Resp 20 | Ht 63.0 in | Wt 141.3 lb

## 2015-09-19 DIAGNOSIS — C3411 Malignant neoplasm of upper lobe, right bronchus or lung: Secondary | ICD-10-CM

## 2015-09-19 DIAGNOSIS — Z5111 Encounter for antineoplastic chemotherapy: Secondary | ICD-10-CM

## 2015-09-19 DIAGNOSIS — C7931 Secondary malignant neoplasm of brain: Secondary | ICD-10-CM

## 2015-09-19 DIAGNOSIS — D649 Anemia, unspecified: Secondary | ICD-10-CM

## 2015-09-19 DIAGNOSIS — C3490 Malignant neoplasm of unspecified part of unspecified bronchus or lung: Secondary | ICD-10-CM

## 2015-09-19 DIAGNOSIS — C3491 Malignant neoplasm of unspecified part of right bronchus or lung: Secondary | ICD-10-CM

## 2015-09-19 DIAGNOSIS — D473 Essential (hemorrhagic) thrombocythemia: Secondary | ICD-10-CM

## 2015-09-19 LAB — TECHNOLOGIST REVIEW

## 2015-09-19 LAB — COMPREHENSIVE METABOLIC PANEL
ALBUMIN: 3.1 g/dL — AB (ref 3.5–5.0)
ALK PHOS: 89 U/L (ref 40–150)
ALT: 13 U/L (ref 0–55)
ANION GAP: 9 meq/L (ref 3–11)
AST: 19 U/L (ref 5–34)
BUN: 15.5 mg/dL (ref 7.0–26.0)
CO2: 27 meq/L (ref 22–29)
Calcium: 9.9 mg/dL (ref 8.4–10.4)
Chloride: 106 mEq/L (ref 98–109)
Creatinine: 1 mg/dL (ref 0.6–1.1)
EGFR: 63 mL/min/{1.73_m2} — AB (ref >90)
Glucose: 124 mg/dl (ref 70–140)
Potassium: 5.3 mEq/L — ABNORMAL HIGH (ref 3.5–5.1)
Sodium: 142 mEq/L (ref 136–145)
TOTAL PROTEIN: 7 g/dL (ref 6.4–8.3)

## 2015-09-19 LAB — CBC WITH DIFFERENTIAL/PLATELET
BASO%: 0.4 % (ref 0.0–2.0)
Basophils Absolute: 0 10*3/uL (ref 0.0–0.1)
EOS ABS: 0.1 10*3/uL (ref 0.0–0.5)
EOS%: 0.6 % (ref 0.0–7.0)
HCT: 35.7 % (ref 34.8–46.6)
HGB: 11.5 g/dL — ABNORMAL LOW (ref 11.6–15.9)
LYMPH%: 21.7 % (ref 14.0–49.7)
MCH: 31.1 pg (ref 25.1–34.0)
MCHC: 32.2 g/dL (ref 31.5–36.0)
MCV: 96.5 fL (ref 79.5–101.0)
MONO#: 1.7 10*3/uL — AB (ref 0.1–0.9)
MONO%: 13.2 % (ref 0.0–14.0)
NEUT%: 64.1 % (ref 38.4–76.8)
NEUTROS ABS: 8.2 10*3/uL — AB (ref 1.5–6.5)
PLATELETS: 722 10*3/uL — AB (ref 145–400)
RBC: 3.7 10*6/uL (ref 3.70–5.45)
RDW: 15.8 % — ABNORMAL HIGH (ref 11.2–14.5)
WBC: 12.8 10*3/uL — AB (ref 3.9–10.3)
lymph#: 2.8 10*3/uL (ref 0.9–3.3)

## 2015-09-19 MED ORDER — INTEGRA PLUS PO CAPS: 1.0000 | ORAL_CAPSULE | Freq: Every morning | ORAL | 2 refills | Status: DC

## 2015-09-19 MED ORDER — SODIUM CHLORIDE 0.9 % IV SOLN
Freq: Once | INTRAVENOUS | Status: AC
  Administered 2015-09-19: 11:00:00 via INTRAVENOUS

## 2015-09-19 MED ORDER — SODIUM CHLORIDE 0.9 % IJ SOLN
10.0000 mL | INTRAMUSCULAR | Status: DC | PRN
  Filled 2015-09-19: qty 10

## 2015-09-19 MED ORDER — SODIUM CHLORIDE 0.9 % IV SOLN
500.0000 mg/m2 | Freq: Once | INTRAVENOUS | Status: AC
  Administered 2015-09-19: 900 mg via INTRAVENOUS
  Filled 2015-09-19: qty 20

## 2015-09-19 MED ORDER — HEPARIN SOD (PORK) LOCK FLUSH 100 UNIT/ML IV SOLN
500.0000 [IU] | Freq: Once | INTRAVENOUS | Status: AC | PRN
  Administered 2015-09-19: 500 [IU]
  Filled 2015-09-19: qty 5

## 2015-09-19 MED ORDER — SODIUM CHLORIDE 0.9 % IV SOLN
Freq: Once | INTRAVENOUS | Status: AC
  Administered 2015-09-19: 11:00:00 via INTRAVENOUS
  Filled 2015-09-19: qty 4

## 2015-09-19 NOTE — Progress Notes (Signed)
[facility] [phone]   [phone]  OFFICE PROGRESS NOTE  [name], MD [place] [address]  Diagnosis:  Metastatic non-small cell lung cancer initially diagnosed with locally advanced stage IIIB with right Pancoast tumor involving the vertebral body as well as the foraminal canal invasion with spinal cord compression in October of 2007.   Prior Therapy:  1. Status post concurrent chemoradiation with weekly carboplatin and paclitaxel, last dose was given November 18, 2005. 2. 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 3. Status post gamma knife radiotherapy to a solitary brain lesion located in the superior frontal area of the brain at [facility] in April of 2008. 4. Status post palliative radiotherapy to the lateral abdominal wall metastatic lesion under the care of Dr. Lisbeth Renshaw, completed March of 2009. 5. Status post 6 cycles of systemic chemotherapy with carboplatin and Alimta. Last dose was given July 26, 2007 with disease stabilization. 6. Gamma knife stereotactic radiotherapy to 2 brain lesions one involving the right frontal dural based as well as right parietal lesion performed on 05/07/2012 under the care of Dr. Vallarie Mare at [facility].  Current Therapy:  Maintenance chemotherapy with Alimta 500 mg per meter squared given every 3 weeks. The patient is status post 139 cycles.   CHEMOTHERAPY INTENT: Palliative/maintenance  CURRENT # OF CHEMOTHERAPY CYCLES: 140 CURRENT ANTIEMETICS: Zofran, dexamethasone and Compazine  CURRENT SMOKING STATUS: Quit smoking 10/07/2005  ORAL CHEMOTHERAPY AND CONSENT: None  CURRENT BISPHOSPHONATES USE: None  PAIN MANAGEMENT: 0/10 currently on morphine  NARCOTICS INDUCED CONSTIPATION: No constipation  LIVING WILL AND CODE STATUS: No CODE BLUE  INTERVAL HISTORY: Danielle [name] 65 y.o. female  returns to the clinic today for followup visit. The patient is feeling fine today with no specific complaints except for mild fatigue and occasional nausea. She continues to tolerate her treatment with Alimta fairly well. She denied having any significant fever or chills. The patient denied having any chest pain, shortness of breath, cough or hemoptysis. No nausea or vomiting. The patient denied having any weight loss or night sweats. She had repeat CT scan of the chest, abdomen and pelvis performed recently and she is here for evaluation and discussion of her scan results.  MEDICAL HISTORY: Past Medical History:  Diagnosis Date  . Anemia   . CVA (cerebral vascular accident) ([facility]) 04/2014   pt states had 2 cva's within 2 wks  . DJD (degenerative joint disease), cervical   . Fibromyalgia   . H/O: pneumonia   . History of tobacco abuse quit 10/08   on nicotine patch  . Hypertension   . Hypokalemia   . Lung cancer ([facility]) dx'd 09/2005   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 had chemo and radiation for lung ca  . Thrush 12/11/2010    ALLERGIES:  is allergic to contrast media [iodinated diagnostic agents]; iohexol; sulfa drugs cross reactors; and co-trimoxazole injection [sulfamethoxazole-trimethoprim].  MEDICATIONS:  Current Outpatient Prescriptions  Medication Sig Dispense Refill  . ALPRAZolam (XANAX) 0.25 MG tablet Take 1 tablet (0.25 mg total) by mouth at bedtime as needed. for sleep 30 tablet 0  . amLODipine (NORVASC) 10 MG tablet TAKE 1 TABLET (10 MG TOTAL) BY MOUTH DAILY. 90 tablet 1  . aspirin EC 325 MG tablet Take 1 tablet (325 mg total) by mouth daily. 90 tablet 0  . atorvastatin (LIPITOR) 40 MG tablet TAKE 1 TABLET  BY MOUTH EVERY DAY AT 6 PM 90 tablet 2  . [facility] ALLERGY 25 MG capsule TAKE 1 CAPSULE (25 MG TOTAL) BY MOUTH ONCE. 2 HOURS BEFORE CT SCAN ON 09/18/2015  0  . fluticasone (FLONASE) 50 MCG/ACT nasal spray Place 1-2 sprays into both nostrils daily as needed.     . folic acid  (FOLVITE) 1 MG tablet TAKE 1 TABLET BY MOUTH DAILY. 90 tablet 1  . furosemide (LASIX) 20 MG tablet TAKE 1 TABLET (20 MG TOTAL) BY MOUTH DAILY AS NEEDED FOR EDEMA. 20 tablet 0  . lidocaine-prilocaine (EMLA) cream Apply to Port-A-Cath as directed 30 g 1  . lisinopril (PRINIVIL,ZESTRIL) 20 MG tablet TAKE 2 TABLETS (40 MG TOTAL) BY MOUTH DAILY. 60 tablet 5  . morphine (MS CONTIN) 60 MG 12 hr tablet Take 1 tablet (60 mg total) by mouth 2 (two) times daily. 60 tablet 0  . morphine (MSIR) 30 MG tablet Take 1 tablet (30 mg total) by mouth every 4 (four) hours as needed (breakthrough pain). 60 tablet 0  . Olopatadine HCl 0.2 % SOLN Apply 1 drop to eye daily. 2.5 mL 11  . ondansetron (ZOFRAN) 8 MG tablet TAKE 1 TABLET (8 MG TOTAL) BY MOUTH 2 (TWO) TIMES DAILY AS NEEDED FOR NAUSEA OR VOMITING. 30 tablet 2  . predniSONE (DELTASONE) 50 MG tablet Take 1 tablet (50 mg total) by mouth See admin instructions. Take 1 tablet (50 mg) 13 hours before CT scan, 7 hours before CT scan and 1 hour before CT scan (every 9 weeks) 3 tablet 1  . diphenhydrAMINE (BENADRYL) 25 MG tablet Take 1 tablet (25 mg total) by mouth once. 2 hours before CT scan on 09/18/2015 10 tablet 0   No current facility-administered medications for this visit.     SURGICAL HISTORY:  Past Surgical History:  Procedure Laterality Date  . CERVICAL LAMINECTOMY  1995  . KNEE SURGERY  1990   Left x 2  . 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 LOOP RECORDER IMPLANT N/A 04/19/2014   Procedure: LOOP RECORDER IMPLANT;  Surgeon: Thompson Grayer, MD;  Location: [facility] CATH LAB;  Service: Cardiovascular;  Laterality: N/A;  . OTHER SURGICAL HISTORY  2008   Gamma knife surgery to remove brain met   . PORTACATH PLACEMENT  -[name]   TIP IN CAVOATRIAL JUNCTION  . TEE WITHOUT CARDIOVERSION N/A 04/19/2014   Procedure: TRANSESOPHAGEAL ECHOCARDIOGRAM (TEE);  Surgeon: Larey Dresser, MD;  Location: [facility];  Service: Cardiovascular;   Laterality: N/A;    REVIEW OF SYSTEMS:  Constitutional: positive for fatigue Eyes: negative Ears, nose, mouth, throat, and face: negative Respiratory: negative Cardiovascular: negative Gastrointestinal: negative Genitourinary:negative Integument/breast: negative Hematologic/lymphatic: negative Musculoskeletal:negative Neurological: negative Behavioral/Psych: negative Endocrine: negative Allergic/Immunologic: negative   PHYSICAL EXAMINATION: General appearance: alert, cooperative and no distress Head: Normocephalic, without obvious abnormality, atraumatic Neck: no adenopathy, no JVD, supple, symmetrical, trachea midline and thyroid not enlarged, symmetric, no tenderness/mass/nodules Lymph nodes: Cervical, supraclavicular, and axillary nodes normal. Resp: clear to auscultation bilaterally Back: symmetric, no curvature. ROM normal. No CVA tenderness. Cardio: regular rate and rhythm, S1, S2 normal, no murmur, click, rub or gallop GI: soft, non-tender; bowel sounds normal; no masses,  no organomegaly Extremities: extremities normal, atraumatic, no cyanosis or edema Neurologic: Alert and oriented X 3, normal strength and tone. Normal symmetric reflexes. Normal coordination and gait  ECOG PERFORMANCE STATUS: 0 - Asymptomatic  Blood pressure 116/74, pulse 100, temperature 98.2 F (36.8  C), temperature source Oral, resp. rate 20, height '5\' 3"'  (1.6 m), weight 141 lb 4.8 oz (64.1 kg), SpO2 100 %.  LABORATORY DATA: Lab Results  Component Value Date   WBC 12.8 (H) 09/19/2015   HGB 11.5 (L) 09/19/2015   HCT 35.7 09/19/2015   MCV 96.5 09/19/2015   PLT 722 (H) 09/19/2015      Chemistry      Component Value Date/Time   NA 142 08/29/2015 0909   K 4.6 08/29/2015 0909   CL 103 01/26/2015 0818   CL 106 06/30/2012 1004   CO2 25 08/29/2015 0909   BUN 14.8 08/29/2015 0909   CREATININE 1.1 08/29/2015 0909      Component Value Date/Time   CALCIUM 9.9 08/29/2015 0909   ALKPHOS 88  08/29/2015 0909   AST 22 08/29/2015 0909   ALT 10 08/29/2015 0909   BILITOT 0.35 08/29/2015 0909       RADIOGRAPHIC STUDIES: Ct Chest W Contrast  Result Date: 09/15/2015 CLINICAL DATA:  Metastatic non-small cell lung cancer initially diagnosed with locally advanced stage IIIB with right Pancoast tumor involving the vertebral body as well as the foraminal canal invasion with spinal cord compression in October of 2007. EXAM: CT CHEST, ABDOMEN, AND PELVIS WITH CONTRAST TECHNIQUE: Multidetector CT imaging of the chest, abdomen and pelvis was performed following the standard protocol during bolus administration of intravenous contrast. CONTRAST:  157m ISOVUE-300 IOPAMIDOL (ISOVUE-300) INJECTION 61% COMPARISON:  05/12/2015 FINDINGS: CT CHEST FINDINGS Cardiovascular: Heart size normal. Coronary artery calcification is noted. Atherosclerotic calcification is noted in the wall of the thoracic aorta. Mediastinum/Nodes: No mediastinal lymphadenopathy. There is no hilar lymphadenopathy. Mild circumferential wall thickening distal esophagus may relate to esophagitis. There is no axillary lymphadenopathy. The right Port-A-Cath tip is positioned in the upper right atrium near the SVC/RA junction. Lungs/Pleura: Posterior right apical nodule stable at 2.7 x 1.4 cm. Triangular density identified previously in the anterior right upper lobe (image 45 series 4 today) is 8 x 12 mm today compared to 9 x 12 mm previously. Centrilobular and paraseptal emphysema noted bilaterally. No new pulmonary nodule or mass. No focal airspace consolidation. No pulmonary edema or pleural effusion. Musculoskeletal: Stable appearance 2 level vertebral augmentation in the thoracic spine. Marked compression deformity T3 unchanged in the interval. Sclerosis identified in the subcortical humeral heads bilaterally which may be related to infarct or osteonecrosis. CT ABDOMEN PELVIS FINDINGS Hepatobiliary: No focal abnormality within the liver  parenchyma. There is no evidence for gallstones, gallbladder wall thickening, or pericholecystic fluid. No intrahepatic or extrahepatic biliary dilation. Pancreas: No focal mass lesion. No dilatation of the main duct. No intraparenchymal cyst. No peripancreatic edema. Spleen: No splenomegaly. No focal mass lesion. Adrenals/Urinary Tract: No adrenal nodule or mass. Kidneys are unremarkable. No evidence for hydroureter. The urinary bladder appears normal for the degree of distention. Stomach/Bowel: Stomach is nondistended. No gastric wall thickening. No evidence of outlet obstruction. Duodenum is normally positioned as is the ligament of Treitz. No small bowel wall thickening. No small bowel dilatation. The terminal ileum is normal. The appendix is normal. No gross colonic mass. No colonic wall thickening. No substantial diverticular change. Vascular/Lymphatic: There is abdominal aortic atherosclerosis without aneurysm. There is no gastrohepatic or hepatoduodenal ligament lymphadenopathy. No intraperitoneal or retroperitoneal lymphadenopathy. No pelvic sidewall lymphadenopathy. Reproductive: The uterus has normal CT imaging appearance. There is no adnexal mass. Other: No intraperitoneal free fluid. Musculoskeletal: Bone windows reveal no worrisome lytic or sclerotic osseous lesions. IMPRESSION: 1. Stable exam. No new  or progressive disease on the current study. The posterior right apical soft tissue is stable. 2. Abdominal aortic atherosclerosis. 3. Emphysema. 4. Stable appearance marked compression deformity T3. 5. Sclerosis in the subcortical humeral heads bilaterally consistent with avascular necrosis. Similar changes in the femoral heads are again noted, also consistent with avascular necrosis. Electronically Signed   By: Misty Stanley M.D.   On: 09/15/2015 16:51   Ct Abdomen Pelvis W Contrast  Result Date: 09/15/2015 CLINICAL DATA:  Metastatic non-small cell lung cancer initially diagnosed with locally  advanced stage IIIB with right Pancoast tumor involving the vertebral body as well as the foraminal canal invasion with spinal cord compression in October of 2007. EXAM: CT CHEST, ABDOMEN, AND PELVIS WITH CONTRAST TECHNIQUE: Multidetector CT imaging of the chest, abdomen and pelvis was performed following the standard protocol during bolus administration of intravenous contrast. CONTRAST:  125m ISOVUE-300 IOPAMIDOL (ISOVUE-300) INJECTION 61% COMPARISON:  05/12/2015 FINDINGS: CT CHEST FINDINGS Cardiovascular: Heart size normal. Coronary artery calcification is noted. Atherosclerotic calcification is noted in the wall of the thoracic aorta. Mediastinum/Nodes: No mediastinal lymphadenopathy. There is no hilar lymphadenopathy. Mild circumferential wall thickening distal esophagus may relate to esophagitis. There is no axillary lymphadenopathy. The right Port-A-Cath tip is positioned in the upper right atrium near the SVC/RA junction. Lungs/Pleura: Posterior right apical nodule stable at 2.7 x 1.4 cm. Triangular density identified previously in the anterior right upper lobe (image 45 series 4 today) is 8 x 12 mm today compared to 9 x 12 mm previously. Centrilobular and paraseptal emphysema noted bilaterally. No new pulmonary nodule or mass. No focal airspace consolidation. No pulmonary edema or pleural effusion. Musculoskeletal: Stable appearance 2 level vertebral augmentation in the thoracic spine. Marked compression deformity T3 unchanged in the interval. Sclerosis identified in the subcortical humeral heads bilaterally which may be related to infarct or osteonecrosis. CT ABDOMEN PELVIS FINDINGS Hepatobiliary: No focal abnormality within the liver parenchyma. There is no evidence for gallstones, gallbladder wall thickening, or pericholecystic fluid. No intrahepatic or extrahepatic biliary dilation. Pancreas: No focal mass lesion. No dilatation of the main duct. No intraparenchymal cyst. No peripancreatic edema.  Spleen: No splenomegaly. No focal mass lesion. Adrenals/Urinary Tract: No adrenal nodule or mass. Kidneys are unremarkable. No evidence for hydroureter. The urinary bladder appears normal for the degree of distention. Stomach/Bowel: Stomach is nondistended. No gastric wall thickening. No evidence of outlet obstruction. Duodenum is normally positioned as is the ligament of Treitz. No small bowel wall thickening. No small bowel dilatation. The terminal ileum is normal. The appendix is normal. No gross colonic mass. No colonic wall thickening. No substantial diverticular change. Vascular/Lymphatic: There is abdominal aortic atherosclerosis without aneurysm. There is no gastrohepatic or hepatoduodenal ligament lymphadenopathy. No intraperitoneal or retroperitoneal lymphadenopathy. No pelvic sidewall lymphadenopathy. Reproductive: The uterus has normal CT imaging appearance. There is no adnexal mass. Other: No intraperitoneal free fluid. Musculoskeletal: Bone windows reveal no worrisome lytic or sclerotic osseous lesions. IMPRESSION: 1. Stable exam. No new or progressive disease on the current study. The posterior right apical soft tissue is stable. 2. Abdominal aortic atherosclerosis. 3. Emphysema. 4. Stable appearance marked compression deformity T3. 5. Sclerosis in the subcortical humeral heads bilaterally consistent with avascular necrosis. Similar changes in the femoral heads are again noted, also consistent with avascular necrosis. Electronically Signed   By: EMisty StanleyM.D.   On: 09/15/2015 16:51   ASSESSMENT AND PLAN: This is a very pleasant 65years old white female with metastatic non-small cell lung cancer currently  undergoing maintenance chemotherapy with single agent Alimta status post 139 cycles. The patient is tolerating her treatment fairly well with no significant adverse effects.  The recent CT scan of the chest, abdomen and pelvis showed no evidence for disease progression. I discussed the scan  results with the patient and her sister. I recommended for her to continue with her maintenance chemotherapy and the patient will receive cycle #140 today. For pain management, she will continue on her current pain medication with MS Contin and MS IR. The patient would come back for follow-up visit in 3 weeks for reevaluation before starting cycle #141.  For the anemia and thrombocytosis, I will start the patient on Integra +1 capsule by mouth daily. She was advised to call immediately if she has any concerning symptoms in the interval. The patient voices understanding of current disease status and treatment options and is in agreement with the current care plan.  All questions were answered. The patient knows to call the clinic with any problems, questions or concerns. We can certainly see the patient much sooner if necessary.  Disclaimer: This note was dictated with voice recognition software. Similar sounding words can inadvertently be transcribed and may not be corrected upon review.

## 2015-09-19 NOTE — Telephone Encounter (Signed)
Per LOS I have scheduled appts and notified the scheduler 

## 2015-09-19 NOTE — Patient Instructions (Signed)
[facility] Discharge Instructions for Patients Receiving Chemotherapy  Today you received the following chemotherapy agents; Alimta.   To help prevent nausea and vomiting after your treatment, we encourage you to take your nausea medication as directed.    If you develop nausea and vomiting that is not controlled by your nausea medication, call the clinic.   BELOW ARE SYMPTOMS THAT SHOULD BE REPORTED IMMEDIATELY:  *FEVER GREATER THAN 100.5 F  *CHILLS WITH OR WITHOUT FEVER  NAUSEA AND VOMITING THAT IS NOT CONTROLLED WITH YOUR NAUSEA MEDICATION  *UNUSUAL SHORTNESS OF BREATH  *UNUSUAL BRUISING OR BLEEDING  TENDERNESS IN MOUTH AND THROAT WITH OR WITHOUT PRESENCE OF ULCERS  *URINARY PROBLEMS  *BOWEL PROBLEMS  UNUSUAL RASH Items with * indicate a potential emergency and should be followed up as soon as possible.  Feel free to call the clinic you have any questions or concerns. The clinic phone number is [phone].  Please show the CHEMO ALERT CARD at check-in to the Emergency Department and triage nurse.   

## 2015-09-19 NOTE — Telephone Encounter (Signed)
Message sent to Chemo scheduler to add chemo per 09/19/15 los. AVS report and appointment schedule given per 09/19/15 los.

## 2015-09-22 ENCOUNTER — Telehealth: Payer: Self-pay

## 2015-09-22 MED ORDER — ALPRAZOLAM 0.25 MG PO TABS: 0.2500 mg | ORAL_TABLET | Freq: Every evening | ORAL | 0 refills | Status: DC | PRN

## 2015-09-22 NOTE — Telephone Encounter (Signed)
Pt called for xanax refill. Noted appropriate time frame. Refill faxed in.

## 2015-10-02 ENCOUNTER — Other Ambulatory Visit: Payer: Self-pay | Admitting: Internal Medicine

## 2015-10-02 DIAGNOSIS — C7931 Secondary malignant neoplasm of brain: Secondary | ICD-10-CM

## 2015-10-02 DIAGNOSIS — C3411 Malignant neoplasm of upper lobe, right bronchus or lung: Secondary | ICD-10-CM

## 2015-10-05 DIAGNOSIS — G939 Disorder of brain, unspecified: Secondary | ICD-10-CM | POA: Diagnosis not present

## 2015-10-05 DIAGNOSIS — C3490 Malignant neoplasm of unspecified part of unspecified bronchus or lung: Secondary | ICD-10-CM | POA: Diagnosis not present

## 2015-10-05 DIAGNOSIS — C7931 Secondary malignant neoplasm of brain: Secondary | ICD-10-CM | POA: Diagnosis not present

## 2015-10-08 LAB — CUP PACEART REMOTE DEVICE CHECK: Date Time Interrogation Session: 20170904223908

## 2015-10-08 NOTE — Progress Notes (Signed)
[facility] summary report received. Battery status OK. Normal device function. No new symptom episodes, tachy episodes, brady, or pause episodes. No new AF episodes. Monthly summary reports and ROV/PRN 

## 2015-10-10 ENCOUNTER — Ambulatory Visit (HOSPITAL_BASED_OUTPATIENT_CLINIC_OR_DEPARTMENT_OTHER): Payer: Medicare Other

## 2015-10-10 ENCOUNTER — Other Ambulatory Visit (HOSPITAL_BASED_OUTPATIENT_CLINIC_OR_DEPARTMENT_OTHER): Payer: Medicare Other

## 2015-10-10 ENCOUNTER — Ambulatory Visit (HOSPITAL_BASED_OUTPATIENT_CLINIC_OR_DEPARTMENT_OTHER): Payer: Medicare Other | Admitting: Internal Medicine

## 2015-10-10 ENCOUNTER — Encounter: Payer: Self-pay | Admitting: Internal Medicine

## 2015-10-10 ENCOUNTER — Telehealth: Payer: Self-pay | Admitting: Internal Medicine

## 2015-10-10 VITALS — BP 112/59 | HR 82 | Temp 97.6°F | Resp 17 | Ht 63.0 in | Wt 143.8 lb

## 2015-10-10 DIAGNOSIS — C3411 Malignant neoplasm of upper lobe, right bronchus or lung: Secondary | ICD-10-CM

## 2015-10-10 DIAGNOSIS — C7931 Secondary malignant neoplasm of brain: Secondary | ICD-10-CM

## 2015-10-10 DIAGNOSIS — C3491 Malignant neoplasm of unspecified part of right bronchus or lung: Secondary | ICD-10-CM

## 2015-10-10 DIAGNOSIS — Z5111 Encounter for antineoplastic chemotherapy: Secondary | ICD-10-CM

## 2015-10-10 LAB — COMPREHENSIVE METABOLIC PANEL
ALBUMIN: 3 g/dL — AB (ref 3.5–5.0)
ALT: 14 U/L (ref 0–55)
AST: 22 U/L (ref 5–34)
Alkaline Phosphatase: 93 U/L (ref 40–150)
Anion Gap: 9 mEq/L (ref 3–11)
BUN: 7.8 mg/dL (ref 7.0–26.0)
CO2: 24 meq/L (ref 22–29)
CREATININE: 0.7 mg/dL (ref 0.6–1.1)
Calcium: 9.3 mg/dL (ref 8.4–10.4)
Chloride: 109 mEq/L (ref 98–109)
GLUCOSE: 113 mg/dL (ref 70–140)
POTASSIUM: 3.9 meq/L (ref 3.5–5.1)
SODIUM: 142 meq/L (ref 136–145)
TOTAL PROTEIN: 6.5 g/dL (ref 6.4–8.3)
Total Bilirubin: 0.49 mg/dL (ref 0.20–1.20)

## 2015-10-10 LAB — CBC WITH DIFFERENTIAL/PLATELET
BASO%: 0.3 % (ref 0.0–2.0)
Basophils Absolute: 0 10*3/uL (ref 0.0–0.1)
EOS ABS: 0.1 10*3/uL (ref 0.0–0.5)
EOS%: 1.5 % (ref 0.0–7.0)
HCT: 35 % (ref 34.8–46.6)
HEMOGLOBIN: 11.5 g/dL — AB (ref 11.6–15.9)
LYMPH%: 22.7 % (ref 14.0–49.7)
MCH: 31.6 pg (ref 25.1–34.0)
MCHC: 32.9 g/dL (ref 31.5–36.0)
MCV: 96.1 fL (ref 79.5–101.0)
MONO#: 0.9 10*3/uL (ref 0.1–0.9)
MONO%: 12.7 % (ref 0.0–14.0)
NEUT%: 62.8 % (ref 38.4–76.8)
NEUTROS ABS: 4.6 10*3/uL (ref 1.5–6.5)
Platelets: 240 10*3/uL (ref 145–400)
RBC: 3.64 10*6/uL — AB (ref 3.70–5.45)
RDW: 15.2 % — AB (ref 11.2–14.5)
WBC: 7.3 10*3/uL (ref 3.9–10.3)
lymph#: 1.7 10*3/uL (ref 0.9–3.3)

## 2015-10-10 MED ORDER — SODIUM CHLORIDE 0.9 % IV SOLN
500.0000 mg/m2 | Freq: Once | INTRAVENOUS | Status: AC
  Administered 2015-10-10: 900 mg via INTRAVENOUS
  Filled 2015-10-10: qty 16

## 2015-10-10 MED ORDER — SODIUM CHLORIDE 0.9 % IV SOLN
Freq: Once | INTRAVENOUS | Status: AC
  Administered 2015-10-10: 11:00:00 via INTRAVENOUS
  Filled 2015-10-10: qty 4

## 2015-10-10 MED ORDER — CYANOCOBALAMIN 1000 MCG/ML IJ SOLN
1000.0000 ug | Freq: Once | INTRAMUSCULAR | Status: AC
  Administered 2015-10-10: 1000 ug via INTRAMUSCULAR

## 2015-10-10 MED ORDER — SODIUM CHLORIDE 0.9 % IJ SOLN
10.0000 mL | INTRAMUSCULAR | Status: DC | PRN
  Administered 2015-10-10: 10 mL
  Filled 2015-10-10: qty 10

## 2015-10-10 MED ORDER — HEPARIN SOD (PORK) LOCK FLUSH 100 UNIT/ML IV SOLN
500.0000 [IU] | Freq: Once | INTRAVENOUS | Status: AC | PRN
  Administered 2015-10-10: 500 [IU]
  Filled 2015-10-10: qty 5

## 2015-10-10 MED ORDER — SODIUM CHLORIDE 0.9 % IV SOLN
Freq: Once | INTRAVENOUS | Status: AC
  Administered 2015-10-10: 10:00:00 via INTRAVENOUS

## 2015-10-10 MED ORDER — CYANOCOBALAMIN 1000 MCG/ML IJ SOLN
INTRAMUSCULAR | Status: AC
  Filled 2015-10-10: qty 1

## 2015-10-10 NOTE — Patient Instructions (Signed)
[facility] Discharge Instructions for Patients Receiving Chemotherapy  Today you received the following chemotherapy agents Alimta  To help prevent nausea and vomiting after your treatment, we encourage you to take your nausea medication   If you develop nausea and vomiting that is not controlled by your nausea medication, call the clinic.   BELOW ARE SYMPTOMS THAT SHOULD BE REPORTED IMMEDIATELY:  *FEVER GREATER THAN 100.5 F  *CHILLS WITH OR WITHOUT FEVER  NAUSEA AND VOMITING THAT IS NOT CONTROLLED WITH YOUR NAUSEA MEDICATION  *UNUSUAL SHORTNESS OF BREATH  *UNUSUAL BRUISING OR BLEEDING  TENDERNESS IN MOUTH AND THROAT WITH OR WITHOUT PRESENCE OF ULCERS  *URINARY PROBLEMS  *BOWEL PROBLEMS  UNUSUAL RASH Items with * indicate a potential emergency and should be followed up as soon as possible.  Feel free to call the clinic you have any questions or concerns. The clinic phone number is [phone].  Please show the [facility] at check-in to the Emergency Department and triage nurse.  Cyanocobalamin, Vitamin B12 injection What is this medicine? CYANOCOBALAMIN (sye an oh koe BAL a min) is a man made form of vitamin B12. Vitamin B12 is used in the growth of healthy blood cells, nerve cells, and proteins in the body. It also helps with the metabolism of fats and carbohydrates. This medicine is used to treat people who can not absorb vitamin B12. This medicine may be used for other purposes; ask your health care provider or pharmacist if you have questions. What should I tell my health care provider before I take this medicine? They need to know if you have any of these conditions: -kidney disease -Leber's disease -megaloblastic anemia -an unusual or allergic reaction to cyanocobalamin, cobalt, other medicines, foods, dyes, or preservatives -pregnant or trying to get pregnant -breast-feeding How should I use this medicine? This medicine is injected into a  muscle or deeply under the skin. It is usually given by a health care professional in a clinic or doctor's office. However, your doctor may teach you how to inject yourself. Follow all instructions. Talk to your pediatrician regarding the use of this medicine in children. Special care may be needed. Overdosage: If you think you have taken too much of this medicine contact a poison control center or emergency room at once. NOTE: This medicine is only for you. Do not share this medicine with others. What if I miss a dose? If you are given your dose at a clinic or doctor's office, call to reschedule your appointment. If you give your own injections and you miss a dose, take it as soon as you can. If it is almost time for your next dose, take only that dose. Do not take double or extra doses. What may interact with this medicine? -colchicine -heavy alcohol intake This list may not describe all possible interactions. Give your health care provider a list of all the medicines, herbs, non-prescription drugs, or dietary supplements you use. Also tell them if you smoke, drink alcohol, or use illegal drugs. Some items may interact with your medicine. What should I watch for while using this medicine? Visit your doctor or health care professional regularly. You may need blood work done while you are taking this medicine. You may need to follow a special diet. Talk to your doctor. Limit your alcohol intake and avoid smoking to get the best benefit. What side effects may I notice from receiving this medicine? Side effects that you should report to your doctor or health  care professional as soon as possible: -allergic reactions like skin rash, itching or hives, swelling of the face, lips, or tongue -blue tint to skin -chest tightness, pain -difficulty breathing, wheezing -dizziness -red, swollen painful area on the leg Side effects that usually do not require medical attention (report to your doctor or health  care professional if they continue or are bothersome): -diarrhea -headache This list may not describe all possible side effects. Call your doctor for medical advice about side effects. You may report side effects to FDA at 1-800-FDA-1088. Where should I keep my medicine? Keep out of the reach of children. Store at room temperature between 15 and 30 degrees C (59 and 85 degrees F). Protect from light. Throw away any unused medicine after the expiration date. NOTE: This sheet is a summary. It may not cover all possible information. If you have questions about this medicine, talk to your doctor, pharmacist, or health care provider.    2016, Elsevier/Gold Standard. (2007-04-06 22:10:20)

## 2015-10-10 NOTE — Progress Notes (Signed)
[facility] [phone]   [phone]  OFFICE PROGRESS NOTE  [name], MD [place] [address]  Diagnosis:  Metastatic non-small cell lung cancer initially diagnosed with locally advanced stage IIIB with right Pancoast tumor involving the vertebral body as well as the foraminal canal invasion with spinal cord compression in October of 2007.   Prior Therapy:  1. Status post concurrent chemoradiation with weekly carboplatin and paclitaxel, last dose was given November 18, 2005. 2. 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 3. Status post gamma knife radiotherapy to a solitary brain lesion located in the superior frontal area of the brain at [facility] in April of 2008. 4. Status post palliative radiotherapy to the lateral abdominal wall metastatic lesion under the care of Dr. Lisbeth Renshaw, completed March of 2009. 5. Status post 6 cycles of systemic chemotherapy with carboplatin and Alimta. Last dose was given July 26, 2007 with disease stabilization. 6. Gamma knife stereotactic radiotherapy to 2 brain lesions one involving the right frontal dural based as well as right parietal lesion performed on 05/07/2012 under the care of Dr. Vallarie Mare at [facility].  Current Therapy:  Maintenance chemotherapy with Alimta 500 mg per meter squared given every 3 weeks. The patient is status post 140 cycles.   CHEMOTHERAPY INTENT: Palliative/maintenance  CURRENT # OF CHEMOTHERAPY CYCLES: 141 CURRENT ANTIEMETICS: Zofran, dexamethasone and Compazine  CURRENT SMOKING STATUS: Quit smoking 10/07/2005  ORAL CHEMOTHERAPY AND CONSENT: None  CURRENT BISPHOSPHONATES USE: None  PAIN MANAGEMENT: 0/10 currently on morphine  NARCOTICS INDUCED CONSTIPATION: No constipation  LIVING WILL AND CODE STATUS: No CODE BLUE  INTERVAL HISTORY: Danielle [name] 65 y.o. female  returns to the clinic today for followup visit accompanied by her sister. The patient is feeling fine today with no specific complaints. She has more energy than before after starting taking oral iron tablets. She continues to tolerate her treatment with Alimta fairly well. She denied having any significant fever or chills. The patient denied having any chest pain, shortness of breath, cough or hemoptysis. No nausea or vomiting. The patient denied having any weight loss or night sweats. She had MRI of the brain performed at [facility] that showed no evidence for disease progression in the brain. She is here today to start cycle #141.  MEDICAL HISTORY: Past Medical History:  Diagnosis Date  . Anemia   . CVA (cerebral vascular accident) ([facility]) 04/2014   pt states had 2 cva's within 2 wks  . DJD (degenerative joint disease), cervical   . Fibromyalgia   . H/O: pneumonia   . History of tobacco abuse quit 10/08   on nicotine patch  . Hypertension   . Hypokalemia   . Lung cancer ([place]) dx'd 09/2005   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 had chemo and radiation for lung ca  . Thrush 12/11/2010    ALLERGIES:  is allergic to contrast media [iodinated diagnostic agents]; iohexol; sulfa drugs cross reactors; and co-trimoxazole injection [sulfamethoxazole-trimethoprim].  MEDICATIONS:  Current Outpatient Prescriptions  Medication Sig Dispense Refill  . ALPRAZolam (XANAX) 0.25 MG tablet Take 1 tablet (0.25 mg total) by mouth at bedtime as needed. for sleep 30 tablet 0  . amLODipine (NORVASC) 10 MG tablet TAKE 1 TABLET (10 MG TOTAL) BY MOUTH DAILY. 90 tablet 1  . aspirin EC 325 MG tablet TAKE 1 TABLET (325 MG TOTAL) BY MOUTH DAILY.  90 tablet 0  . atorvastatin (LIPITOR) 40 MG tablet TAKE 1 TABLET BY MOUTH EVERY DAY AT 6 PM 90 tablet 2  . [facility] ALLERGY 25 MG capsule TAKE 1 CAPSULE (25 MG TOTAL) BY MOUTH ONCE. 2 HOURS BEFORE CT SCAN ON 09/18/2015  0  . FeFum-FePoly-FA-B Cmp-C-Biot (INTEGRA  PLUS) CAPS Take 1 capsule by mouth every morning. 30 capsule 2  . fluticasone (FLONASE) 50 MCG/ACT nasal spray Place 1-2 sprays into both nostrils daily as needed.     . folic acid (FOLVITE) 1 MG tablet TAKE 1 TABLET BY MOUTH DAILY. 90 tablet 1  . furosemide (LASIX) 20 MG tablet TAKE 1 TABLET (20 MG TOTAL) BY MOUTH DAILY AS NEEDED FOR EDEMA. 20 tablet 0  . lidocaine-prilocaine (EMLA) cream Apply to Port-A-Cath as directed 30 g 1  . lisinopril (PRINIVIL,ZESTRIL) 20 MG tablet TAKE 2 TABLETS (40 MG TOTAL) BY MOUTH DAILY. 60 tablet 5  . morphine (MS CONTIN) 60 MG 12 hr tablet Take 1 tablet (60 mg total) by mouth 2 (two) times daily. 60 tablet 0  . morphine (MSIR) 30 MG tablet Take 1 tablet (30 mg total) by mouth every 4 (four) hours as needed (breakthrough pain). 60 tablet 0  . Olopatadine HCl 0.2 % SOLN Apply 1 drop to eye daily. 2.5 mL 11  . ondansetron (ZOFRAN) 8 MG tablet TAKE 1 TABLET (8 MG TOTAL) BY MOUTH 2 (TWO) TIMES DAILY AS NEEDED FOR NAUSEA OR VOMITING. 30 tablet 2  . predniSONE (DELTASONE) 50 MG tablet Take 1 tablet (50 mg total) by mouth See admin instructions. Take 1 tablet (50 mg) 13 hours before CT scan, 7 hours before CT scan and 1 hour before CT scan (every 9 weeks) 3 tablet 1  . diphenhydrAMINE (BENADRYL) 25 MG tablet Take 1 tablet (25 mg total) by mouth once. 2 hours before CT scan on 09/18/2015 10 tablet 0   No current facility-administered medications for this visit.     SURGICAL HISTORY:  Past Surgical History:  Procedure Laterality Date  . CERVICAL LAMINECTOMY  1995  . KNEE SURGERY  1990   Left x 2  . 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 LOOP RECORDER IMPLANT N/A 04/19/2014   Procedure: LOOP RECORDER IMPLANT;  Surgeon: Thompson Grayer, MD;  Location: [facility] CATH LAB;  Service: Cardiovascular;  Laterality: N/A;  . OTHER SURGICAL HISTORY  2008   Gamma knife surgery to remove brain met   . PORTACATH PLACEMENT  -[name]   TIP IN CAVOATRIAL JUNCTION   . TEE WITHOUT CARDIOVERSION N/A 04/19/2014   Procedure: TRANSESOPHAGEAL ECHOCARDIOGRAM (TEE);  Surgeon: Larey Dresser, MD;  Location: [facility] ENDOSCOPY;  Service: Cardiovascular;  Laterality: N/A;    REVIEW OF SYSTEMS:  A comprehensive review of systems was negative.   PHYSICAL EXAMINATION: General appearance: alert, cooperative and no distress Head: Normocephalic, without obvious abnormality, atraumatic Neck: no adenopathy, no JVD, supple, symmetrical, trachea midline and thyroid not enlarged, symmetric, no tenderness/mass/nodules Lymph nodes: Cervical, supraclavicular, and axillary nodes normal. Resp: clear to auscultation bilaterally Back: symmetric, no curvature. ROM normal. No CVA tenderness. Cardio: regular rate and rhythm, S1, S2 normal, no murmur, click, rub or gallop GI: soft, non-tender; bowel sounds normal; no masses,  no organomegaly Extremities: extremities normal, atraumatic, no cyanosis or edema Neurologic: Alert and oriented X 3, normal strength and tone. Normal symmetric reflexes. Normal coordination and gait  ECOG PERFORMANCE STATUS: 0 - Asymptomatic  Blood pressure (!) 112/59, pulse  82, temperature 97.6 F (36.4 C), temperature source Oral, resp. rate 17, height '5\' 3"'  (1.6 m), weight 143 lb 12.8 oz (65.2 kg).  LABORATORY DATA: Lab Results  Component Value Date   WBC 7.3 10/10/2015   HGB 11.5 (L) 10/10/2015   HCT 35.0 10/10/2015   MCV 96.1 10/10/2015   PLT 240 10/10/2015      Chemistry      Component Value Date/Time   NA 142 10/10/2015 0906   K 3.9 10/10/2015 0906   CL 103 01/26/2015 0818   CL 106 06/30/2012 1004   CO2 24 10/10/2015 0906   BUN 7.8 10/10/2015 0906   CREATININE 0.7 10/10/2015 0906      Component Value Date/Time   CALCIUM 9.3 10/10/2015 0906   ALKPHOS 93 10/10/2015 0906   AST 22 10/10/2015 0906   ALT 14 10/10/2015 0906   BILITOT 0.49 10/10/2015 0906       RADIOGRAPHIC STUDIES: Ct Chest W Contrast  Result Date: 09/15/2015 CLINICAL  DATA:  Metastatic non-small cell lung cancer initially diagnosed with locally advanced stage IIIB with right Pancoast tumor involving the vertebral body as well as the foraminal canal invasion with spinal cord compression in October of 2007. EXAM: CT CHEST, ABDOMEN, AND PELVIS WITH CONTRAST TECHNIQUE: Multidetector CT imaging of the chest, abdomen and pelvis was performed following the standard protocol during bolus administration of intravenous contrast. CONTRAST:  134m ISOVUE-300 IOPAMIDOL (ISOVUE-300) INJECTION 61% COMPARISON:  05/12/2015 FINDINGS: CT CHEST FINDINGS Cardiovascular: Heart size normal. Coronary artery calcification is noted. Atherosclerotic calcification is noted in the wall of the thoracic aorta. Mediastinum/Nodes: No mediastinal lymphadenopathy. There is no hilar lymphadenopathy. Mild circumferential wall thickening distal esophagus may relate to esophagitis. There is no axillary lymphadenopathy. The right Port-A-Cath tip is positioned in the upper right atrium near the SVC/RA junction. Lungs/Pleura: Posterior right apical nodule stable at 2.7 x 1.4 cm. Triangular density identified previously in the anterior right upper lobe (image 45 series 4 today) is 8 x 12 mm today compared to 9 x 12 mm previously. Centrilobular and paraseptal emphysema noted bilaterally. No new pulmonary nodule or mass. No focal airspace consolidation. No pulmonary edema or pleural effusion. Musculoskeletal: Stable appearance 2 level vertebral augmentation in the thoracic spine. Marked compression deformity T3 unchanged in the interval. Sclerosis identified in the subcortical humeral heads bilaterally which may be related to infarct or osteonecrosis. CT ABDOMEN PELVIS FINDINGS Hepatobiliary: No focal abnormality within the liver parenchyma. There is no evidence for gallstones, gallbladder wall thickening, or pericholecystic fluid. No intrahepatic or extrahepatic biliary dilation. Pancreas: No focal mass lesion. No  dilatation of the main duct. No intraparenchymal cyst. No peripancreatic edema. Spleen: No splenomegaly. No focal mass lesion. Adrenals/Urinary Tract: No adrenal nodule or mass. Kidneys are unremarkable. No evidence for hydroureter. The urinary bladder appears normal for the degree of distention. Stomach/Bowel: Stomach is nondistended. No gastric wall thickening. No evidence of outlet obstruction. Duodenum is normally positioned as is the ligament of Treitz. No small bowel wall thickening. No small bowel dilatation. The terminal ileum is normal. The appendix is normal. No gross colonic mass. No colonic wall thickening. No substantial diverticular change. Vascular/Lymphatic: There is abdominal aortic atherosclerosis without aneurysm. There is no gastrohepatic or hepatoduodenal ligament lymphadenopathy. No intraperitoneal or retroperitoneal lymphadenopathy. No pelvic sidewall lymphadenopathy. Reproductive: The uterus has normal CT imaging appearance. There is no adnexal mass. Other: No intraperitoneal free fluid. Musculoskeletal: Bone windows reveal no worrisome lytic or sclerotic osseous lesions. IMPRESSION: 1. Stable exam. No new  or progressive disease on the current study. The posterior right apical soft tissue is stable. 2. Abdominal aortic atherosclerosis. 3. Emphysema. 4. Stable appearance marked compression deformity T3. 5. Sclerosis in the subcortical humeral heads bilaterally consistent with avascular necrosis. Similar changes in the femoral heads are again noted, also consistent with avascular necrosis. Electronically Signed   By: Misty Stanley M.D.   On: 09/15/2015 16:51   Ct Abdomen Pelvis W Contrast  Result Date: 09/15/2015 CLINICAL DATA:  Metastatic non-small cell lung cancer initially diagnosed with locally advanced stage IIIB with right Pancoast tumor involving the vertebral body as well as the foraminal canal invasion with spinal cord compression in October of 2007. EXAM: CT CHEST, ABDOMEN, AND  PELVIS WITH CONTRAST TECHNIQUE: Multidetector CT imaging of the chest, abdomen and pelvis was performed following the standard protocol during bolus administration of intravenous contrast. CONTRAST:  139m ISOVUE-300 IOPAMIDOL (ISOVUE-300) INJECTION 61% COMPARISON:  05/12/2015 FINDINGS: CT CHEST FINDINGS Cardiovascular: Heart size normal. Coronary artery calcification is noted. Atherosclerotic calcification is noted in the wall of the thoracic aorta. Mediastinum/Nodes: No mediastinal lymphadenopathy. There is no hilar lymphadenopathy. Mild circumferential wall thickening distal esophagus may relate to esophagitis. There is no axillary lymphadenopathy. The right Port-A-Cath tip is positioned in the upper right atrium near the SVC/RA junction. Lungs/Pleura: Posterior right apical nodule stable at 2.7 x 1.4 cm. Triangular density identified previously in the anterior right upper lobe (image 45 series 4 today) is 8 x 12 mm today compared to 9 x 12 mm previously. Centrilobular and paraseptal emphysema noted bilaterally. No new pulmonary nodule or mass. No focal airspace consolidation. No pulmonary edema or pleural effusion. Musculoskeletal: Stable appearance 2 level vertebral augmentation in the thoracic spine. Marked compression deformity T3 unchanged in the interval. Sclerosis identified in the subcortical humeral heads bilaterally which may be related to infarct or osteonecrosis. CT ABDOMEN PELVIS FINDINGS Hepatobiliary: No focal abnormality within the liver parenchyma. There is no evidence for gallstones, gallbladder wall thickening, or pericholecystic fluid. No intrahepatic or extrahepatic biliary dilation. Pancreas: No focal mass lesion. No dilatation of the main duct. No intraparenchymal cyst. No peripancreatic edema. Spleen: No splenomegaly. No focal mass lesion. Adrenals/Urinary Tract: No adrenal nodule or mass. Kidneys are unremarkable. No evidence for hydroureter. The urinary bladder appears normal for the  degree of distention. Stomach/Bowel: Stomach is nondistended. No gastric wall thickening. No evidence of outlet obstruction. Duodenum is normally positioned as is the ligament of Treitz. No small bowel wall thickening. No small bowel dilatation. The terminal ileum is normal. The appendix is normal. No gross colonic mass. No colonic wall thickening. No substantial diverticular change. Vascular/Lymphatic: There is abdominal aortic atherosclerosis without aneurysm. There is no gastrohepatic or hepatoduodenal ligament lymphadenopathy. No intraperitoneal or retroperitoneal lymphadenopathy. No pelvic sidewall lymphadenopathy. Reproductive: The uterus has normal CT imaging appearance. There is no adnexal mass. Other: No intraperitoneal free fluid. Musculoskeletal: Bone windows reveal no worrisome lytic or sclerotic osseous lesions. IMPRESSION: 1. Stable exam. No new or progressive disease on the current study. The posterior right apical soft tissue is stable. 2. Abdominal aortic atherosclerosis. 3. Emphysema. 4. Stable appearance marked compression deformity T3. 5. Sclerosis in the subcortical humeral heads bilaterally consistent with avascular necrosis. Similar changes in the femoral heads are again noted, also consistent with avascular necrosis. Electronically Signed   By: EMisty StanleyM.D.   On: 09/15/2015 16:51   ASSESSMENT AND PLAN: This is a very pleasant 65years old white female with metastatic non-small cell lung cancer currently  undergoing maintenance chemotherapy with single agent Alimta status post 139 cycles. The patient is tolerating her treatment fairly well with no significant adverse effects.  I recommended for her to continue with her maintenance chemotherapy and the patient will receive cycle #141 today. For pain management, she will continue on her current pain medication with MS Contin and MS IR. The patient would come back for follow-up visit in 3 weeks for reevaluation before starting cycle  #142.  For the anemia and thrombocytosis, she will continue on Integra +1 capsule by mouth daily. She was advised to call immediately if she has any concerning symptoms in the interval. The patient voices understanding of current disease status and treatment options and is in agreement with the current care plan.  All questions were answered. The patient knows to call the clinic with any problems, questions or concerns. We can certainly see the patient much sooner if necessary.  Disclaimer: This note was dictated with voice recognition software. Similar sounding words can inadvertently be transcribed and may not be corrected upon review.

## 2015-10-10 NOTE — Telephone Encounter (Signed)
Patient already on schedule for lab/fu/tx q3w thru December. Patient will get print out in infusion area.

## 2015-10-11 ENCOUNTER — Ambulatory Visit (INDEPENDENT_AMBULATORY_CARE_PROVIDER_SITE_OTHER): Payer: Medicare Other | Admitting: *Deleted

## 2015-10-11 DIAGNOSIS — I639 Cerebral infarction, unspecified: Secondary | ICD-10-CM

## 2015-10-12 NOTE — Progress Notes (Signed)
[facility] Summary Report / Loop Recorder 

## 2015-10-13 ENCOUNTER — Telehealth: Payer: Self-pay

## 2015-10-13 ENCOUNTER — Other Ambulatory Visit: Payer: Self-pay | Admitting: *Deleted

## 2015-10-13 DIAGNOSIS — C3490 Malignant neoplasm of unspecified part of unspecified bronchus or lung: Secondary | ICD-10-CM

## 2015-10-13 DIAGNOSIS — C3410 Malignant neoplasm of upper lobe, unspecified bronchus or lung: Secondary | ICD-10-CM

## 2015-10-13 MED ORDER — MORPHINE SULFATE ER 60 MG PO TBCR: 60.0000 mg | EXTENDED_RELEASE_TABLET | Freq: Two times a day (BID) | ORAL | 0 refills | Status: DC

## 2015-10-13 MED ORDER — MORPHINE SULFATE 30 MG PO TABS: 30.0000 mg | ORAL_TABLET | ORAL | 0 refills | Status: DC | PRN

## 2015-10-13 NOTE — Telephone Encounter (Signed)
Pt calling for both her 30 mg and 60 mg morphine to be refilled. She can pick up on Monday.

## 2015-10-14 ENCOUNTER — Other Ambulatory Visit: Payer: Self-pay | Admitting: Internal Medicine

## 2015-10-20 ENCOUNTER — Other Ambulatory Visit: Payer: Self-pay | Admitting: Medical Oncology

## 2015-10-20 DIAGNOSIS — D649 Anemia, unspecified: Secondary | ICD-10-CM

## 2015-10-20 MED ORDER — INTEGRA PLUS PO CAPS: 1.0000 | ORAL_CAPSULE | Freq: Every morning | ORAL | 1 refills | Status: DC

## 2015-10-25 ENCOUNTER — Other Ambulatory Visit: Payer: Self-pay | Admitting: Family Medicine

## 2015-10-27 ENCOUNTER — Telehealth: Payer: Self-pay | Admitting: *Deleted

## 2015-10-27 MED ORDER — ALPRAZOLAM 0.25 MG PO TABS: 0.2500 mg | ORAL_TABLET | Freq: Every evening | ORAL | 0 refills | Status: DC | PRN

## 2015-10-27 NOTE — Telephone Encounter (Signed)
multiple attempts to reach pt's pharmacy [facility] [address] [place] [phone] or [phone] number is out of service. Called pt for alternative pharmacy. Pt advised this has happened in the past. She is able to call, however she has been told by other offices they cannot get through. Pt advised she will call pharmacy and ask them to give our office a call for a refill on her Xanax Rx. No further concerns.

## 2015-10-27 NOTE — Telephone Encounter (Signed)
Call from pt, who advised [facility] and received [facility]. Requested new pharmacy for Xanax Rx to be called into. Per pt request [facility] [address] Lorina Rabon . Rx called in per pt request.

## 2015-10-31 ENCOUNTER — Other Ambulatory Visit (HOSPITAL_BASED_OUTPATIENT_CLINIC_OR_DEPARTMENT_OTHER): Payer: Medicare Other

## 2015-10-31 ENCOUNTER — Encounter: Payer: Self-pay | Admitting: Internal Medicine

## 2015-10-31 ENCOUNTER — Ambulatory Visit (HOSPITAL_BASED_OUTPATIENT_CLINIC_OR_DEPARTMENT_OTHER): Payer: Medicare Other | Admitting: Internal Medicine

## 2015-10-31 ENCOUNTER — Ambulatory Visit (HOSPITAL_BASED_OUTPATIENT_CLINIC_OR_DEPARTMENT_OTHER): Payer: Medicare Other

## 2015-10-31 VITALS — BP 131/45 | HR 69 | Temp 97.9°F | Resp 17 | Ht 63.0 in | Wt 144.4 lb

## 2015-10-31 DIAGNOSIS — C7931 Secondary malignant neoplasm of brain: Secondary | ICD-10-CM

## 2015-10-31 DIAGNOSIS — D649 Anemia, unspecified: Secondary | ICD-10-CM

## 2015-10-31 DIAGNOSIS — C3491 Malignant neoplasm of unspecified part of right bronchus or lung: Secondary | ICD-10-CM

## 2015-10-31 DIAGNOSIS — C3411 Malignant neoplasm of upper lobe, right bronchus or lung: Secondary | ICD-10-CM

## 2015-10-31 DIAGNOSIS — Z5111 Encounter for antineoplastic chemotherapy: Secondary | ICD-10-CM

## 2015-10-31 DIAGNOSIS — D473 Essential (hemorrhagic) thrombocythemia: Secondary | ICD-10-CM

## 2015-10-31 LAB — CBC WITH DIFFERENTIAL/PLATELET
BASO%: 0.1 % (ref 0.0–2.0)
Basophils Absolute: 0 10*3/uL (ref 0.0–0.1)
EOS%: 1.1 % (ref 0.0–7.0)
Eosinophils Absolute: 0.1 10*3/uL (ref 0.0–0.5)
HCT: 35.6 % (ref 34.8–46.6)
HEMOGLOBIN: 11.6 g/dL (ref 11.6–15.9)
LYMPH%: 26.9 % (ref 14.0–49.7)
MCH: 31.7 pg (ref 25.1–34.0)
MCHC: 32.6 g/dL (ref 31.5–36.0)
MCV: 97.3 fL (ref 79.5–101.0)
MONO#: 0.7 10*3/uL (ref 0.1–0.9)
MONO%: 8.4 % (ref 0.0–14.0)
NEUT%: 63.5 % (ref 38.4–76.8)
NEUTROS ABS: 5.2 10*3/uL (ref 1.5–6.5)
PLATELETS: 389 10*3/uL (ref 145–400)
RBC: 3.66 10*6/uL — AB (ref 3.70–5.45)
RDW: 14.1 % (ref 11.2–14.5)
WBC: 8.2 10*3/uL (ref 3.9–10.3)
lymph#: 2.2 10*3/uL (ref 0.9–3.3)

## 2015-10-31 LAB — COMPREHENSIVE METABOLIC PANEL
ALT: 10 U/L (ref 0–55)
AST: 19 U/L (ref 5–34)
Albumin: 3.1 g/dL — ABNORMAL LOW (ref 3.5–5.0)
Alkaline Phosphatase: 93 U/L (ref 40–150)
Anion Gap: 10 mEq/L (ref 3–11)
BILIRUBIN TOTAL: 0.27 mg/dL (ref 0.20–1.20)
BUN: 10.4 mg/dL (ref 7.0–26.0)
CO2: 25 meq/L (ref 22–29)
CREATININE: 0.8 mg/dL (ref 0.6–1.1)
Calcium: 9.3 mg/dL (ref 8.4–10.4)
Chloride: 109 mEq/L (ref 98–109)
EGFR: 75 mL/min/{1.73_m2} — ABNORMAL LOW (ref >90)
GLUCOSE: 124 mg/dL (ref 70–140)
Potassium: 3.7 mEq/L (ref 3.5–5.1)
SODIUM: 143 meq/L (ref 136–145)
TOTAL PROTEIN: 6.7 g/dL (ref 6.4–8.3)

## 2015-10-31 MED ORDER — DEXAMETHASONE SODIUM PHOSPHATE 10 MG/ML IJ SOLN
INTRAMUSCULAR | Status: AC
  Filled 2015-10-31: qty 1

## 2015-10-31 MED ORDER — ONDANSETRON HCL 8 MG PO TABS
8.0000 mg | ORAL_TABLET | Freq: Once | ORAL | Status: AC
  Administered 2015-10-31: 8 mg via ORAL

## 2015-10-31 MED ORDER — ONDANSETRON HCL 8 MG PO TABS
ORAL_TABLET | ORAL | Status: AC
  Filled 2015-10-31: qty 1

## 2015-10-31 MED ORDER — SODIUM CHLORIDE 0.9 % IJ SOLN
10.0000 mL | INTRAMUSCULAR | Status: DC | PRN
  Administered 2015-10-31: 10 mL
  Filled 2015-10-31: qty 10

## 2015-10-31 MED ORDER — HEPARIN SOD (PORK) LOCK FLUSH 100 UNIT/ML IV SOLN
500.0000 [IU] | Freq: Once | INTRAVENOUS | Status: AC | PRN
  Administered 2015-10-31: 500 [IU]
  Filled 2015-10-31: qty 5

## 2015-10-31 MED ORDER — SODIUM CHLORIDE 0.9 % IV SOLN
Freq: Once | INTRAVENOUS | Status: AC
  Administered 2015-10-31: 12:00:00 via INTRAVENOUS

## 2015-10-31 MED ORDER — SODIUM CHLORIDE 0.9 % IV SOLN
500.0000 mg/m2 | Freq: Once | INTRAVENOUS | Status: AC
  Administered 2015-10-31: 900 mg via INTRAVENOUS
  Filled 2015-10-31: qty 20

## 2015-10-31 MED ORDER — DEXAMETHASONE SODIUM PHOSPHATE 10 MG/ML IJ SOLN
10.0000 mg | Freq: Once | INTRAMUSCULAR | Status: AC
  Administered 2015-10-31: 10 mg via INTRAVENOUS

## 2015-10-31 NOTE — Progress Notes (Signed)
[place] Telephone:(336) [phone]   Fax:(336) [phone]  OFFICE PROGRESS NOTE  Danielle Lofts, Danielle [name] [place] [address]  Diagnosis:  Metastatic non-small cell lung cancer initially diagnosed with locally advanced stage IIIB with right Pancoast tumor involving the vertebral body as well as the foraminal canal invasion with spinal cord compression in October of 2007.   Prior Therapy:  1. Status post concurrent chemoradiation with weekly carboplatin and paclitaxel, last dose was given November 18, 2005. 2. 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 3. Status post gamma knife radiotherapy to a solitary brain lesion located in the superior frontal area of the brain at [facility] in April of 2008. 4. Status post palliative radiotherapy to the lateral abdominal wall metastatic lesion under the care of Dr. Lisbeth Renshaw, completed March of 2009. 5. Status post 6 cycles of systemic chemotherapy with carboplatin and Alimta. Last dose was given July 26, 2007 with disease stabilization. 6. Gamma knife stereotactic radiotherapy to 2 brain lesions one involving the right frontal dural based as well as right parietal lesion performed on 05/07/2012 under the care of Dr. Vallarie Mare at [facility].  Current Therapy:  Maintenance chemotherapy with Alimta 500 mg per meter squared given every 3 weeks. The patient is status post 141 cycles.   CHEMOTHERAPY INTENT: Palliative/maintenance  CURRENT # OF CHEMOTHERAPY CYCLES: 142 CURRENT ANTIEMETICS: Zofran, dexamethasone and Compazine  CURRENT SMOKING STATUS: Quit smoking 10/07/2005  ORAL CHEMOTHERAPY AND CONSENT: None  CURRENT BISPHOSPHONATES USE: None  PAIN MANAGEMENT: 0/10 currently on morphine  NARCOTICS INDUCED CONSTIPATION: No constipation  LIVING WILL AND CODE STATUS: No CODE BLUE  INTERVAL HISTORY: Danielle [name] 65 y.o. female  returns to the clinic today for followup visit accompanied by her sister. The patient is feeling fine today with no specific complaints. She tolerated the last cycle of her treatment with Alimta fairly well. She denied having any significant fever or chills. The patient denied having any chest pain, shortness of breath, cough or hemoptysis. No nausea or vomiting. The patient denied having any weight loss or night sweats. She is here today to start cycle #142.  MEDICAL HISTORY: Past Medical History:  Diagnosis Date  . Anemia   . CVA (cerebral vascular accident) ([place]) 04/2014   pt states had 2 cva's within 2 wks  . DJD (degenerative joint disease), cervical   . Fibromyalgia   . H/O: pneumonia   . History of tobacco abuse quit 10/08   on nicotine patch  . Hypertension   . Hypokalemia   . Lung cancer ([place]) dx'd 09/2005   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 had chemo and radiation for lung ca  . Thrush 12/11/2010    ALLERGIES:  is allergic to contrast media [iodinated diagnostic agents]; iohexol; sulfa drugs cross reactors; and co-trimoxazole injection [sulfamethoxazole-trimethoprim].  MEDICATIONS:  Current Outpatient Prescriptions  Medication Sig Dispense Refill  . ALPRAZolam (XANAX) 0.25 MG tablet Take 1 tablet (0.25 mg total) by mouth at bedtime as needed. for sleep 30 tablet 0  . amLODipine (NORVASC) 10 MG tablet TAKE 1 TABLET (10 MG TOTAL) BY MOUTH DAILY. 90 tablet 1  . aspirin EC 325 MG tablet TAKE 1 TABLET (325 MG TOTAL) BY MOUTH DAILY. 90 tablet 0  . atorvastatin (LIPITOR) 40 MG tablet TAKE 1 TABLET BY MOUTH EVERY DAY AT 6 PM 90 tablet 2  . [facility] ALLERGY 25 MG capsule TAKE  1 CAPSULE (25 MG TOTAL) BY MOUTH ONCE. 2 HOURS BEFORE CT SCAN ON 09/18/2015  0  . FeFum-FePoly-FA-B Cmp-C-Biot (INTEGRA PLUS) CAPS Take 1 capsule by mouth every morning. 90 capsule 1  . fluticasone (FLONASE) 50 MCG/ACT nasal spray Place 1-2 sprays into both nostrils daily as needed.     . folic acid (FOLVITE) 1  MG tablet TAKE 1 TABLET BY MOUTH DAILY. 90 tablet 1  . furosemide (LASIX) 20 MG tablet TAKE 1 TABLET (20 MG TOTAL) BY MOUTH DAILY AS NEEDED FOR EDEMA. 20 tablet 0  . lidocaine-prilocaine (EMLA) cream Apply to Port-A-Cath as directed 30 g 1  . lisinopril (PRINIVIL,ZESTRIL) 20 MG tablet TAKE 2 TABLETS (40 MG TOTAL) BY MOUTH DAILY. 60 tablet 5  . morphine (MS CONTIN) 60 MG 12 hr tablet Take 1 tablet (60 mg total) by mouth 2 (two) times daily. 60 tablet 0  . morphine (MSIR) 30 MG tablet Take 1 tablet (30 mg total) by mouth every 4 (four) hours as needed (breakthrough pain). 60 tablet 0  . Olopatadine HCl 0.2 % SOLN Apply 1 drop to eye daily. 2.5 mL 11  . ondansetron (ZOFRAN) 8 MG tablet TAKE 1 TABLET (8 MG TOTAL) BY MOUTH 2 (TWO) TIMES DAILY AS NEEDED FOR NAUSEA OR VOMITING. 30 tablet 2  . predniSONE (DELTASONE) 50 MG tablet Take 1 tablet (50 mg total) by mouth See admin instructions. Take 1 tablet (50 mg) 13 hours before CT scan, 7 hours before CT scan and 1 hour before CT scan (every 9 weeks) 3 tablet 1  . diphenhydrAMINE (BENADRYL) 25 MG tablet Take 1 tablet (25 mg total) by mouth once. 2 hours before CT scan on 09/18/2015 10 tablet 0   No current facility-administered medications for this visit.     SURGICAL HISTORY:  Past Surgical History:  Procedure Laterality Date  . CERVICAL LAMINECTOMY  1995  . KNEE SURGERY  1990   Left x 2  . 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 LOOP RECORDER IMPLANT N/A 04/19/2014   Procedure: LOOP RECORDER IMPLANT;  Surgeon: Thompson Grayer, Danielle [name];  Location: [facility] CATH LAB;  Service: Cardiovascular;  Laterality: N/A;  . OTHER SURGICAL HISTORY  2008   Gamma knife surgery to remove brain met   . PORTACATH PLACEMENT  -[name]   TIP IN CAVOATRIAL JUNCTION  . TEE WITHOUT CARDIOVERSION N/A 04/19/2014   Procedure: TRANSESOPHAGEAL ECHOCARDIOGRAM (TEE);  Surgeon: Larey Dresser, Danielle [name];  Location: [facility] ENDOSCOPY;  Service: Cardiovascular;  Laterality:  N/A;    REVIEW OF SYSTEMS:  A comprehensive review of systems was negative.   PHYSICAL EXAMINATION: General appearance: alert, cooperative and no distress Head: Normocephalic, without obvious abnormality, atraumatic Neck: no adenopathy, no JVD, supple, symmetrical, trachea midline and thyroid not enlarged, symmetric, no tenderness/mass/nodules Lymph nodes: Cervical, supraclavicular, and axillary nodes normal. Resp: clear to auscultation bilaterally Back: symmetric, no curvature. ROM normal. No CVA tenderness. Cardio: regular rate and rhythm, S1, S2 normal, no murmur, click, rub or gallop GI: soft, non-tender; bowel sounds normal; no masses,  no organomegaly Extremities: extremities normal, atraumatic, no cyanosis or edema Neurologic: Alert and oriented X 3, normal strength and tone. Normal symmetric reflexes. Normal coordination and gait  ECOG PERFORMANCE STATUS: 0 - Asymptomatic  Blood pressure (!) 131/45, pulse 69, temperature 97.9 F (36.6 C), temperature source Oral, resp. rate 17, height '5\' 3"'  (1.6 m), weight 144 lb 6.4 oz (65.5 kg), SpO2 100 %.  LABORATORY DATA: Lab  Results  Component Value Date   WBC 8.2 10/31/2015   HGB 11.6 10/31/2015   HCT 35.6 10/31/2015   MCV 97.3 10/31/2015   PLT 389 10/31/2015      Chemistry      Component Value Date/Time   NA 143 10/31/2015 0957   K 3.7 10/31/2015 0957   CL 103 01/26/2015 0818   CL 106 06/30/2012 1004   CO2 25 10/31/2015 0957   BUN 10.4 10/31/2015 0957   CREATININE 0.8 10/31/2015 0957      Component Value Date/Time   CALCIUM 9.3 10/31/2015 0957   ALKPHOS 93 10/31/2015 0957   AST 19 10/31/2015 0957   ALT 10 10/31/2015 0957   BILITOT 0.27 10/31/2015 0957       RADIOGRAPHIC STUDIES: No results found. ASSESSMENT AND PLAN: This is a very pleasant 65 years old white female with metastatic non-small cell lung cancer currently undergoing maintenance chemotherapy with single agent Alimta status post 141 cycles. The patient  is tolerating her treatment fairly well with no significant adverse effects.  I recommended for her to continue with her maintenance chemotherapy and the patient will receive cycle #142 today. For pain management, she will continue on her current pain medication with MS Contin and MS IR. The patient would come back for follow-up visit in 3 weeks for reevaluation before starting cycle #143.  For the anemia and thrombocytosis, she will continue on Integra +1 capsule by mouth daily. She was advised to call immediately if she has any concerning symptoms in the interval. The patient voices understanding of current disease status and treatment options and is in agreement with the current care plan.  All questions were answered. The patient knows to call the clinic with any problems, questions or concerns. We can certainly see the patient much sooner if necessary.  Disclaimer: This note was dictated with voice recognition software. Similar sounding words can inadvertently be transcribed and may not be corrected upon review.

## 2015-10-31 NOTE — Patient Instructions (Signed)
[facility] Discharge Instructions for Patients Receiving Chemotherapy  Today you received the following chemotherapy agents Alimta.  To help prevent nausea and vomiting after your treatment, we encourage you to take your nausea medication as prescribed.   If you develop nausea and vomiting that is not controlled by your nausea medication, call the clinic.   BELOW ARE SYMPTOMS THAT SHOULD BE REPORTED IMMEDIATELY:  *FEVER GREATER THAN 100.5 F  *CHILLS WITH OR WITHOUT FEVER  NAUSEA AND VOMITING THAT IS NOT CONTROLLED WITH YOUR NAUSEA MEDICATION  *UNUSUAL SHORTNESS OF BREATH  *UNUSUAL BRUISING OR BLEEDING  TENDERNESS IN MOUTH AND THROAT WITH OR WITHOUT PRESENCE OF ULCERS  *URINARY PROBLEMS  *BOWEL PROBLEMS  UNUSUAL RASH Items with * indicate a potential emergency and should be followed up as soon as possible.  Feel free to call the clinic you have any questions or concerns. The clinic phone number is [phone].  Please show the CHEMO ALERT CARD at check-in to the Emergency Department and triage nurse.   

## 2015-11-01 ENCOUNTER — Other Ambulatory Visit: Payer: Self-pay | Admitting: Internal Medicine

## 2015-11-07 ENCOUNTER — Other Ambulatory Visit: Payer: Self-pay | Admitting: Medical Oncology

## 2015-11-07 DIAGNOSIS — D649 Anemia, unspecified: Secondary | ICD-10-CM

## 2015-11-07 MED ORDER — INTEGRA PLUS PO CAPS: 1.0000 | ORAL_CAPSULE | Freq: Every morning | ORAL | 1 refills | Status: DC

## 2015-11-08 ENCOUNTER — Other Ambulatory Visit: Payer: Self-pay | Admitting: Internal Medicine

## 2015-11-08 DIAGNOSIS — C7931 Secondary malignant neoplasm of brain: Secondary | ICD-10-CM

## 2015-11-10 ENCOUNTER — Ambulatory Visit (INDEPENDENT_AMBULATORY_CARE_PROVIDER_SITE_OTHER): Payer: Medicare Other | Admitting: *Deleted

## 2015-11-10 DIAGNOSIS — I639 Cerebral infarction, unspecified: Secondary | ICD-10-CM

## 2015-11-13 NOTE — Progress Notes (Signed)
[facility] Summary Report / Loop Recorder 

## 2015-11-14 ENCOUNTER — Other Ambulatory Visit: Payer: Self-pay | Admitting: Medical Oncology

## 2015-11-14 DIAGNOSIS — C3490 Malignant neoplasm of unspecified part of unspecified bronchus or lung: Secondary | ICD-10-CM

## 2015-11-14 DIAGNOSIS — C3410 Malignant neoplasm of upper lobe, unspecified bronchus or lung: Secondary | ICD-10-CM

## 2015-11-14 MED ORDER — MORPHINE SULFATE 30 MG PO TABS: 30.0000 mg | ORAL_TABLET | ORAL | 0 refills | Status: DC | PRN

## 2015-11-14 MED ORDER — MORPHINE SULFATE ER 60 MG PO TBCR: 60.0000 mg | EXTENDED_RELEASE_TABLET | Freq: Two times a day (BID) | ORAL | 0 refills | Status: DC

## 2015-11-19 ENCOUNTER — Other Ambulatory Visit: Payer: Self-pay | Admitting: Internal Medicine

## 2015-11-19 LAB — CUP PACEART REMOTE DEVICE CHECK
Date Time Interrogation Session: 20171004234141
MDC IDC PG IMPLANT DT: 20160412

## 2015-11-19 NOTE — Progress Notes (Signed)
[facility] summary report received. Battery status OK. Normal device function. No new symptom episodes, tachy episodes, brady, or pause episodes. No new AF episodes. Monthly summary reports and ROV/PRN 

## 2015-11-21 ENCOUNTER — Other Ambulatory Visit: Payer: Self-pay | Admitting: Medical Oncology

## 2015-11-21 ENCOUNTER — Other Ambulatory Visit (HOSPITAL_BASED_OUTPATIENT_CLINIC_OR_DEPARTMENT_OTHER): Payer: Medicare Other

## 2015-11-21 ENCOUNTER — Ambulatory Visit (HOSPITAL_BASED_OUTPATIENT_CLINIC_OR_DEPARTMENT_OTHER): Payer: Medicare Other

## 2015-11-21 ENCOUNTER — Encounter: Payer: Self-pay | Admitting: Internal Medicine

## 2015-11-21 ENCOUNTER — Ambulatory Visit (HOSPITAL_BASED_OUTPATIENT_CLINIC_OR_DEPARTMENT_OTHER): Payer: Medicare Other | Admitting: Internal Medicine

## 2015-11-21 ENCOUNTER — Telehealth: Payer: Self-pay | Admitting: Internal Medicine

## 2015-11-21 DIAGNOSIS — D649 Anemia, unspecified: Secondary | ICD-10-CM

## 2015-11-21 DIAGNOSIS — C3491 Malignant neoplasm of unspecified part of right bronchus or lung: Secondary | ICD-10-CM

## 2015-11-21 DIAGNOSIS — C3411 Malignant neoplasm of upper lobe, right bronchus or lung: Secondary | ICD-10-CM

## 2015-11-21 DIAGNOSIS — Z5111 Encounter for antineoplastic chemotherapy: Secondary | ICD-10-CM

## 2015-11-21 DIAGNOSIS — C7931 Secondary malignant neoplasm of brain: Secondary | ICD-10-CM

## 2015-11-21 DIAGNOSIS — D473 Essential (hemorrhagic) thrombocythemia: Secondary | ICD-10-CM

## 2015-11-21 LAB — COMPREHENSIVE METABOLIC PANEL
ALT: 11 U/L (ref 0–55)
AST: 20 U/L (ref 5–34)
Albumin: 3.2 g/dL — ABNORMAL LOW (ref 3.5–5.0)
Alkaline Phosphatase: 101 U/L (ref 40–150)
Anion Gap: 10 mEq/L (ref 3–11)
BILIRUBIN TOTAL: 0.47 mg/dL (ref 0.20–1.20)
BUN: 6.4 mg/dL — ABNORMAL LOW (ref 7.0–26.0)
CO2: 24 meq/L (ref 22–29)
Calcium: 9.8 mg/dL (ref 8.4–10.4)
Chloride: 108 mEq/L (ref 98–109)
Creatinine: 0.8 mg/dL (ref 0.6–1.1)
EGFR: 81 mL/min/{1.73_m2} — AB (ref >90)
GLUCOSE: 114 mg/dL (ref 70–140)
Potassium: 3.7 mEq/L (ref 3.5–5.1)
SODIUM: 142 meq/L (ref 136–145)
TOTAL PROTEIN: 6.8 g/dL (ref 6.4–8.3)

## 2015-11-21 LAB — CBC WITH DIFFERENTIAL/PLATELET
BASO%: 0.4 % (ref 0.0–2.0)
Basophils Absolute: 0 10*3/uL (ref 0.0–0.1)
EOS ABS: 0.1 10*3/uL (ref 0.0–0.5)
EOS%: 1 % (ref 0.0–7.0)
HCT: 36.7 % (ref 34.8–46.6)
HGB: 12.1 g/dL (ref 11.6–15.9)
LYMPH%: 26.6 % (ref 14.0–49.7)
MCH: 31.4 pg (ref 25.1–34.0)
MCHC: 32.9 g/dL (ref 31.5–36.0)
MCV: 95.5 fL (ref 79.5–101.0)
MONO#: 1.1 10*3/uL — ABNORMAL HIGH (ref 0.1–0.9)
MONO%: 12.8 % (ref 0.0–14.0)
NEUT%: 59.2 % (ref 38.4–76.8)
NEUTROS ABS: 5.1 10*3/uL (ref 1.5–6.5)
Platelets: 340 10*3/uL (ref 145–400)
RBC: 3.85 10*6/uL (ref 3.70–5.45)
RDW: 14.8 % — ABNORMAL HIGH (ref 11.2–14.5)
WBC: 8.6 10*3/uL (ref 3.9–10.3)
lymph#: 2.3 10*3/uL (ref 0.9–3.3)

## 2015-11-21 MED ORDER — ONDANSETRON HCL 8 MG PO TABS
ORAL_TABLET | ORAL | Status: AC
  Filled 2015-11-21: qty 1

## 2015-11-21 MED ORDER — HEPARIN SOD (PORK) LOCK FLUSH 100 UNIT/ML IV SOLN
500.0000 [IU] | Freq: Once | INTRAVENOUS | Status: AC | PRN
  Administered 2015-11-21: 500 [IU]
  Filled 2015-11-21: qty 5

## 2015-11-21 MED ORDER — SODIUM CHLORIDE 0.9 % IV SOLN
500.0000 mg/m2 | Freq: Once | INTRAVENOUS | Status: AC
  Administered 2015-11-21: 900 mg via INTRAVENOUS
  Filled 2015-11-21: qty 20

## 2015-11-21 MED ORDER — SODIUM CHLORIDE 0.9 % IV SOLN
Freq: Once | INTRAVENOUS | Status: AC
  Administered 2015-11-21: 10:00:00 via INTRAVENOUS

## 2015-11-21 MED ORDER — LIDOCAINE-PRILOCAINE 2.5-2.5 % EX CREA
TOPICAL_CREAM | CUTANEOUS | 1 refills | Status: DC
Start: 2015-11-21 — End: 2015-11-21

## 2015-11-21 MED ORDER — DEXAMETHASONE SODIUM PHOSPHATE 10 MG/ML IJ SOLN
10.0000 mg | Freq: Once | INTRAMUSCULAR | Status: AC
  Administered 2015-11-21: 10 mg via INTRAVENOUS

## 2015-11-21 MED ORDER — LIDOCAINE-PRILOCAINE 2.5-2.5 % EX CREA: TOPICAL_CREAM | CUTANEOUS | 1 refills | Status: DC

## 2015-11-21 MED ORDER — ONDANSETRON HCL 8 MG PO TABS
8.0000 mg | ORAL_TABLET | Freq: Once | ORAL | Status: AC
  Administered 2015-11-21: 8 mg via ORAL

## 2015-11-21 MED ORDER — DEXAMETHASONE SODIUM PHOSPHATE 10 MG/ML IJ SOLN
INTRAMUSCULAR | Status: AC
  Filled 2015-11-21: qty 1

## 2015-11-21 MED ORDER — SODIUM CHLORIDE 0.9 % IJ SOLN
10.0000 mL | INTRAMUSCULAR | Status: DC | PRN
  Administered 2015-11-21: 10 mL
  Filled 2015-11-21: qty 10

## 2015-11-21 NOTE — Progress Notes (Signed)
Received prior auth request form for Lidocaine-Prilocaine cream . Attempted to complete online through Cover My Meds and was experiencing technical difficulties. Called [facility] and spoke with Elta Guadeloupe who assisted me.  PA approved via phone  Effective 08/23/15-02/19/16 ELYH#[id number]  Called [facility] on [place] [address] [place]'[email or web address]'$ [phone] received message that the number is not working. Attempted to call the patient and the phone kept ringing. Sent approval via fax to [email or web address][email or web address]'$ [phone]. Fax received ok per confirmation sheet.

## 2015-11-21 NOTE — Progress Notes (Signed)
[place] Telephone:(336) [phone]   Fax:(336) [phone]  OFFICE PROGRESS NOTE  [name], MD [place] [address]  Diagnosis:  Metastatic non-small cell lung cancer initially diagnosed with locally advanced stage IIIB with right Pancoast tumor involving the vertebral body as well as the foraminal canal invasion with spinal cord compression in October of 2007.   Prior Therapy:  1. Status post concurrent chemoradiation with weekly carboplatin and paclitaxel, last dose was given November 18, 2005. 2. 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 3. Status post gamma knife radiotherapy to a solitary brain lesion located in the superior frontal area of the brain at [facility] in April of 2008. 4. Status post palliative radiotherapy to the lateral abdominal wall metastatic lesion under the care of Dr. Lisbeth Renshaw, completed March of 2009. 5. Status post 6 cycles of systemic chemotherapy with carboplatin and Alimta. Last dose was given July 26, 2007 with disease stabilization. 6. Gamma knife stereotactic radiotherapy to 2 brain lesions one involving the right frontal dural based as well as right parietal lesion performed on 05/07/2012 under the care of Dr. Vallarie Mare at [facility].  Current Therapy:  Maintenance chemotherapy with Alimta 500 mg per meter squared given every 3 weeks. The patient is status post 142 cycles.   CHEMOTHERAPY INTENT: Palliative/maintenance  CURRENT # OF CHEMOTHERAPY CYCLES: 143 CURRENT ANTIEMETICS: Zofran, dexamethasone and Compazine  CURRENT SMOKING STATUS: Quit smoking 10/07/2005  ORAL CHEMOTHERAPY AND CONSENT: None  CURRENT BISPHOSPHONATES USE: None  PAIN MANAGEMENT: 0/10 currently on morphine  NARCOTICS INDUCED CONSTIPATION: No constipation  LIVING WILL AND CODE STATUS: No CODE BLUE  INTERVAL HISTORY: Danielle [name] 65 y.o. female  returns to the clinic today for followup visit accompanied by her sister. The patient is feeling fine today with no specific complaints. She tolerated the last cycle of her treatment with Alimta fairly well. She denied having any significant fever or chills. The patient denied having any chest pain, shortness of breath, cough or hemoptysis. No nausea or vomiting. She lost a few pounds recently but she still has good appetite. She is here today to start cycle #143.  MEDICAL HISTORY: Past Medical History:  Diagnosis Date  . Anemia   . CVA (cerebral vascular accident) ([place]) 04/2014   pt states had 2 cva's within 2 wks  . DJD (degenerative joint disease), cervical   . Fibromyalgia   . H/O: pneumonia   . History of tobacco abuse quit 10/08   on nicotine patch  . Hypertension   . Hypokalemia   . Lung cancer ([place]) dx'd 09/2005   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 had chemo and radiation for lung ca  . Thrush 12/11/2010    ALLERGIES:  is allergic to contrast media [iodinated diagnostic agents]; iohexol; sulfa drugs cross reactors; and co-trimoxazole injection [sulfamethoxazole-trimethoprim].  MEDICATIONS:  Current Outpatient Prescriptions  Medication Sig Dispense Refill  . ALPRAZolam (XANAX) 0.25 MG tablet Take 1 tablet (0.25 mg total) by mouth at bedtime as needed. for sleep 30 tablet 0  . amLODipine (NORVASC) 10 MG tablet TAKE 1 TABLET (10 MG TOTAL) BY MOUTH DAILY. 90 tablet 1  . aspirin EC 325 MG tablet TAKE 1 TABLET (325 MG TOTAL) BY MOUTH DAILY. 90 tablet 0  . atorvastatin (LIPITOR) 40 MG tablet TAKE 1 TABLET BY MOUTH EVERY DAY AT 6 PM 90 tablet 2  . [facility] ALLERGY 25 MG  capsule TAKE 1 CAPSULE (25 MG TOTAL) BY MOUTH ONCE. 2 HOURS BEFORE CT SCAN ON 09/18/2015  0  . FeFum-FePoly-FA-B Cmp-C-Biot (INTEGRA PLUS) CAPS Take 1 capsule by mouth every morning. 90 capsule 1  . fluticasone (FLONASE) 50 MCG/ACT nasal spray Place 1-2 sprays into both nostrils daily as needed.     . folic acid  (FOLVITE) 1 MG tablet TAKE 1 TABLET BY MOUTH DAILY. 90 tablet 1  . furosemide (LASIX) 20 MG tablet TAKE 1 TABLET (20 MG TOTAL) BY MOUTH DAILY AS NEEDED FOR EDEMA. 20 tablet 0  . lisinopril (PRINIVIL,ZESTRIL) 20 MG tablet TAKE 2 TABLETS (40 MG TOTAL) BY MOUTH DAILY. 60 tablet 5  . morphine (MS CONTIN) 60 MG 12 hr tablet Take 1 tablet (60 mg total) by mouth 2 (two) times daily. 60 tablet 0  . morphine (MSIR) 30 MG tablet Take 1 tablet (30 mg total) by mouth every 4 (four) hours as needed (breakthrough pain). 60 tablet 0  . Olopatadine HCl 0.2 % SOLN Apply 1 drop to eye daily. 2.5 mL 11  . ondansetron (ZOFRAN) 8 MG tablet TAKE 1 TABLET (8 MG TOTAL) BY MOUTH 2 (TWO) TIMES DAILY AS NEEDED FOR NAUSEA OR VOMITING. 30 tablet 2  . predniSONE (DELTASONE) 50 MG tablet Take 1 tablet (50 mg total) by mouth See admin instructions. Take 1 tablet (50 mg) 13 hours before CT scan, 7 hours before CT scan and 1 hour before CT scan (every 9 weeks) 3 tablet 1  . diphenhydrAMINE (BENADRYL) 25 MG tablet Take 1 tablet (25 mg total) by mouth once. 2 hours before CT scan on 09/18/2015 10 tablet 0  . lidocaine-prilocaine (EMLA) cream Apply to Port-A-Cath as directed 1-2 hours prior to chemotherapy 30 g 1   No current facility-administered medications for this visit.     SURGICAL HISTORY:  Past Surgical History:  Procedure Laterality Date  . CERVICAL LAMINECTOMY  1995  . KNEE SURGERY  1990   Left x 2  . 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 LOOP RECORDER IMPLANT N/A 04/19/2014   Procedure: LOOP RECORDER IMPLANT;  Surgeon: Thompson Grayer, MD;  Location[facility] CATH LAB;  Service: Cardiovascular;  Laterality: N/A;  . OTHER SURGICAL HISTORY  2008   Gamma knife surgery to remove brain met   . PORTACATH PLACEMENT  -[name]   TIP IN CAVOATRIAL JUNCTION  . TEE WITHOUT CARDIOVERSION N/A 04/19/2014   Procedure: TRANSESOPHAGEAL ECHOCARDIOGRAM (TEE);  Surgeon: Larey Dresser, MD;  Location: [facility]  ENDOSCOPY;  Service: Cardiovascular;  Laterality: N/A;    REVIEW OF SYSTEMS:  A comprehensive review of systems was negative.   PHYSICAL EXAMINATION: General appearance: alert, cooperative and no distress Head: Normocephalic, without obvious abnormality, atraumatic Neck: no adenopathy, no JVD, supple, symmetrical, trachea midline and thyroid not enlarged, symmetric, no tenderness/mass/nodules Lymph nodes: Cervical, supraclavicular, and axillary nodes normal. Resp: clear to auscultation bilaterally Back: symmetric, no curvature. ROM normal. No CVA tenderness. Cardio: regular rate and rhythm, S1, S2 normal, no murmur, click, rub or gallop GI: soft, non-tender; bowel sounds normal; no masses,  no organomegaly Extremities: extremities normal, atraumatic, no cyanosis or edema Neurologic: Alert and oriented X 3, normal strength and tone. Normal symmetric reflexes. Normal coordination and gait  ECOG PERFORMANCE STATUS: 0 - Asymptomatic  Blood pressure (!) 134/58, pulse 93, temperature 98 F (36.7 C), temperature source Oral, resp. rate 18, height _0  (1.6 m), weight 139 lb 6.4 oz (63.2 kg),  SpO2 97 %.  LABORATORY DATA: Lab Results  Component Value Date   WBC 8.6 11/21/2015   HGB 12.1 11/21/2015   HCT 36.7 11/21/2015   MCV 95.5 11/21/2015   PLT 340 11/21/2015      Chemistry      Component Value Date/Time   NA 142 11/21/2015 0819   K 3.7 11/21/2015 0819   CL 103 01/26/2015 0818   CL 106 06/30/2012 1004   CO2 24 11/21/2015 0819   BUN 6.4 (L) 11/21/2015 0819   CREATININE 0.8 11/21/2015 0819      Component Value Date/Time   CALCIUM 9.8 11/21/2015 0819   ALKPHOS 101 11/21/2015 0819   AST 20 11/21/2015 0819   ALT 11 11/21/2015 0819   BILITOT 0.47 11/21/2015 0819       RADIOGRAPHIC STUDIES: No results found. ASSESSMENT AND PLAN: This is a very pleasant 65 years old white female with metastatic non-small cell lung cancer currently undergoing maintenance chemotherapy with  single agent Alimta status post 142 cycles. The patient is tolerating her treatment fairly well with no significant adverse effects.  I recommended for her to continue with her maintenance chemotherapy and the patient will receive cycle #143 today. For pain management, she will continue on her current pain medication with MS Contin and MS IR. The patient would come back for follow-up visit in 3 weeks for reevaluation before starting cycle #144.  For the anemia and thrombocytosis, she will continue on Integra +1 capsule by mouth daily. I also did have refill of Emla cream. She was advised to call immediately if she has any concerning symptoms in the interval. The patient voices understanding of current disease status and treatment options and is in agreement with the current care plan.  All questions were answered. The patient knows to call the clinic with any problems, questions or concerns. We can certainly see the patient much sooner if necessary.  Disclaimer: This note was dictated with voice recognition software. Similar sounding words can inadvertently be transcribed and may not be corrected upon review.

## 2015-11-21 NOTE — Patient Instructions (Signed)
[facility] Discharge Instructions for Patients Receiving Chemotherapy  Today you received the following chemotherapy agents Alimta.  To help prevent nausea and vomiting after your treatment, we encourage you to take your nausea medication as prescribed.   If you develop nausea and vomiting that is not controlled by your nausea medication, call the clinic.   BELOW ARE SYMPTOMS THAT SHOULD BE REPORTED IMMEDIATELY:  *FEVER GREATER THAN 100.5 F  *CHILLS WITH OR WITHOUT FEVER  NAUSEA AND VOMITING THAT IS NOT CONTROLLED WITH YOUR NAUSEA MEDICATION  *UNUSUAL SHORTNESS OF BREATH  *UNUSUAL BRUISING OR BLEEDING  TENDERNESS IN MOUTH AND THROAT WITH OR WITHOUT PRESENCE OF ULCERS  *URINARY PROBLEMS  *BOWEL PROBLEMS  UNUSUAL RASH Items with * indicate a potential emergency and should be followed up as soon as possible.  Feel free to call the clinic you have any questions or concerns. The clinic phone number is [phone].  Please show the CHEMO ALERT CARD at check-in to the Emergency Department and triage nurse.   

## 2015-11-21 NOTE — Telephone Encounter (Signed)
Gave patient avs report and appointments for December thru February.  °

## 2015-11-28 ENCOUNTER — Other Ambulatory Visit: Payer: Self-pay | Admitting: Medical Oncology

## 2015-11-28 DIAGNOSIS — F411 Generalized anxiety disorder: Secondary | ICD-10-CM

## 2015-11-28 MED ORDER — ALPRAZOLAM 0.25 MG PO TABS: 0.2500 mg | ORAL_TABLET | Freq: Every evening | ORAL | 0 refills | Status: DC | PRN

## 2015-12-06 ENCOUNTER — Other Ambulatory Visit: Payer: Self-pay | Admitting: Internal Medicine

## 2015-12-07 ENCOUNTER — Other Ambulatory Visit: Payer: Self-pay | Admitting: Family Medicine

## 2015-12-11 ENCOUNTER — Ambulatory Visit (INDEPENDENT_AMBULATORY_CARE_PROVIDER_SITE_OTHER): Payer: Medicare Other | Admitting: *Deleted

## 2015-12-11 DIAGNOSIS — I639 Cerebral infarction, unspecified: Secondary | ICD-10-CM | POA: Diagnosis not present

## 2015-12-11 NOTE — Progress Notes (Signed)
[facility] Summary Report / Loop Recorder 

## 2015-12-12 ENCOUNTER — Ambulatory Visit (HOSPITAL_BASED_OUTPATIENT_CLINIC_OR_DEPARTMENT_OTHER): Payer: Medicare Other

## 2015-12-12 ENCOUNTER — Encounter: Payer: Self-pay | Admitting: Internal Medicine

## 2015-12-12 ENCOUNTER — Ambulatory Visit (HOSPITAL_BASED_OUTPATIENT_CLINIC_OR_DEPARTMENT_OTHER): Payer: Medicare Other | Admitting: Internal Medicine

## 2015-12-12 ENCOUNTER — Telehealth: Payer: Self-pay | Admitting: Internal Medicine

## 2015-12-12 ENCOUNTER — Other Ambulatory Visit (HOSPITAL_BASED_OUTPATIENT_CLINIC_OR_DEPARTMENT_OTHER): Payer: Medicare Other

## 2015-12-12 DIAGNOSIS — C7931 Secondary malignant neoplasm of brain: Secondary | ICD-10-CM

## 2015-12-12 DIAGNOSIS — C3410 Malignant neoplasm of upper lobe, unspecified bronchus or lung: Secondary | ICD-10-CM

## 2015-12-12 DIAGNOSIS — C3491 Malignant neoplasm of unspecified part of right bronchus or lung: Secondary | ICD-10-CM

## 2015-12-12 DIAGNOSIS — C3411 Malignant neoplasm of upper lobe, right bronchus or lung: Secondary | ICD-10-CM

## 2015-12-12 DIAGNOSIS — C3490 Malignant neoplasm of unspecified part of unspecified bronchus or lung: Secondary | ICD-10-CM

## 2015-12-12 DIAGNOSIS — Z5111 Encounter for antineoplastic chemotherapy: Secondary | ICD-10-CM

## 2015-12-12 LAB — COMPREHENSIVE METABOLIC PANEL
ALBUMIN: 3.1 g/dL — AB (ref 3.5–5.0)
ALK PHOS: 98 U/L (ref 40–150)
ALT: 12 U/L (ref 0–55)
AST: 22 U/L (ref 5–34)
Anion Gap: 8 mEq/L (ref 3–11)
BUN: 12.2 mg/dL (ref 7.0–26.0)
CALCIUM: 9.5 mg/dL (ref 8.4–10.4)
CO2: 27 mEq/L (ref 22–29)
CREATININE: 0.8 mg/dL (ref 0.6–1.1)
Chloride: 108 mEq/L (ref 98–109)
EGFR: 76 mL/min/{1.73_m2} — ABNORMAL LOW (ref >90)
Glucose: 141 mg/dl — ABNORMAL HIGH (ref 70–140)
POTASSIUM: 4.1 meq/L (ref 3.5–5.1)
Sodium: 142 mEq/L (ref 136–145)
Total Bilirubin: 0.38 mg/dL (ref 0.20–1.20)
Total Protein: 7 g/dL (ref 6.4–8.3)

## 2015-12-12 LAB — CBC WITH DIFFERENTIAL/PLATELET
BASO%: 0.4 % (ref 0.0–2.0)
BASOS ABS: 0 10*3/uL (ref 0.0–0.1)
EOS ABS: 0.1 10*3/uL (ref 0.0–0.5)
EOS%: 0.7 % (ref 0.0–7.0)
HEMATOCRIT: 36.3 % (ref 34.8–46.6)
HEMOGLOBIN: 11.8 g/dL (ref 11.6–15.9)
LYMPH#: 2 10*3/uL (ref 0.9–3.3)
LYMPH%: 18.1 % (ref 14.0–49.7)
MCH: 30.9 pg (ref 25.1–34.0)
MCHC: 32.5 g/dL (ref 31.5–36.0)
MCV: 95 fL (ref 79.5–101.0)
MONO#: 1 10*3/uL — AB (ref 0.1–0.9)
MONO%: 9.5 % (ref 0.0–14.0)
NEUT#: 7.7 10*3/uL — ABNORMAL HIGH (ref 1.5–6.5)
NEUT%: 71.3 % (ref 38.4–76.8)
Platelets: 341 10*3/uL (ref 145–400)
RBC: 3.82 10*6/uL (ref 3.70–5.45)
RDW: 14.4 % (ref 11.2–14.5)
WBC: 10.9 10*3/uL — ABNORMAL HIGH (ref 3.9–10.3)

## 2015-12-12 MED ORDER — HEPARIN SOD (PORK) LOCK FLUSH 100 UNIT/ML IV SOLN
500.0000 [IU] | Freq: Once | INTRAVENOUS | Status: AC | PRN
Start: 2015-12-12 — End: 2015-12-12
  Administered 2015-12-12: 500 [IU]
  Filled 2015-12-12: qty 5

## 2015-12-12 MED ORDER — CYANOCOBALAMIN 1000 MCG/ML IJ SOLN
1000.0000 ug | Freq: Once | INTRAMUSCULAR | Status: AC
  Administered 2015-12-12: 1000 ug via INTRAMUSCULAR

## 2015-12-12 MED ORDER — MORPHINE SULFATE 30 MG PO TABS: 30.0000 mg | ORAL_TABLET | ORAL | 0 refills | Status: DC | PRN

## 2015-12-12 MED ORDER — MORPHINE SULFATE ER 60 MG PO TBCR: 60.0000 mg | EXTENDED_RELEASE_TABLET | Freq: Two times a day (BID) | ORAL | 0 refills | Status: DC

## 2015-12-12 MED ORDER — DEXAMETHASONE SODIUM PHOSPHATE 10 MG/ML IJ SOLN
INTRAMUSCULAR | Status: AC
  Filled 2015-12-12: qty 1

## 2015-12-12 MED ORDER — SODIUM CHLORIDE 0.9 % IV SOLN
Freq: Once | INTRAVENOUS | Status: AC
  Administered 2015-12-12: 10:00:00 via INTRAVENOUS

## 2015-12-12 MED ORDER — ONDANSETRON HCL 8 MG PO TABS
8.0000 mg | ORAL_TABLET | Freq: Once | ORAL | Status: AC
  Administered 2015-12-12: 8 mg via ORAL

## 2015-12-12 MED ORDER — SODIUM CHLORIDE 0.9 % IV SOLN
500.0000 mg/m2 | Freq: Once | INTRAVENOUS | Status: AC
  Administered 2015-12-12: 900 mg via INTRAVENOUS
  Filled 2015-12-12: qty 20

## 2015-12-12 MED ORDER — ONDANSETRON HCL 8 MG PO TABS
ORAL_TABLET | ORAL | Status: AC
  Filled 2015-12-12: qty 1

## 2015-12-12 MED ORDER — SODIUM CHLORIDE 0.9 % IJ SOLN
10.0000 mL | INTRAMUSCULAR | Status: DC | PRN
  Administered 2015-12-12: 10 mL
  Filled 2015-12-12: qty 10

## 2015-12-12 MED ORDER — DEXAMETHASONE SODIUM PHOSPHATE 10 MG/ML IJ SOLN
10.0000 mg | Freq: Once | INTRAMUSCULAR | Status: AC
  Administered 2015-12-12: 10 mg via INTRAVENOUS

## 2015-12-12 MED ORDER — CYANOCOBALAMIN 1000 MCG/ML IJ SOLN
INTRAMUSCULAR | Status: AC
  Filled 2015-12-12: qty 1

## 2015-12-12 NOTE — Patient Instructions (Signed)
[facility] Discharge Instructions for Patients Receiving Chemotherapy  Today you received the following chemotherapy agents Alimta.  To help prevent nausea and vomiting after your treatment, we encourage you to take your nausea medication as prescribed.   If you develop nausea and vomiting that is not controlled by your nausea medication, call the clinic.   BELOW ARE SYMPTOMS THAT SHOULD BE REPORTED IMMEDIATELY:  *FEVER GREATER THAN 100.5 F  *CHILLS WITH OR WITHOUT FEVER  NAUSEA AND VOMITING THAT IS NOT CONTROLLED WITH YOUR NAUSEA MEDICATION  *UNUSUAL SHORTNESS OF BREATH  *UNUSUAL BRUISING OR BLEEDING  TENDERNESS IN MOUTH AND THROAT WITH OR WITHOUT PRESENCE OF ULCERS  *URINARY PROBLEMS  *BOWEL PROBLEMS  UNUSUAL RASH Items with * indicate a potential emergency and should be followed up as soon as possible.  Feel free to call the clinic you have any questions or concerns. The clinic phone number is [phone].  Please show the CHEMO ALERT CARD at check-in to the Emergency Department and triage nurse.   

## 2015-12-12 NOTE — Progress Notes (Signed)
[place] [phone]   Fax:(336) [phone]  OFFICE PROGRESS NOTE  [name], MD [place] [address]  DIAGNOSIS: Metastatic non-small cell lung cancer initially diagnosed as locally advanced stage IIIB with a right Pancoast tumor involving the vertebral body as well as foraminal canal invasion with spinal cord compression in October 2007.  PRIOR THERAPY: 1. Status post concurrent chemoradiation with weekly carboplatin and paclitaxel, last dose was given November 18, 2005. 2. 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 3. Status post gamma knife radiotherapy to a solitary brain lesion located in the superior frontal area of the brain at [facility] in April of 2008. 4. Status post palliative radiotherapy to the lateral abdominal wall metastatic lesion under the care of Dr. Lisbeth Renshaw, completed March of 2009. 5. Status post 6 cycles of systemic chemotherapy with carboplatin and Alimta. Last dose was given July 26, 2007 with disease stabilization. 6. Gamma knife stereotactic radiotherapy to 2 brain lesions one involving the right frontal dural based as well as right parietal lesion performed on 05/07/2012 under the care of Dr. Vallarie Mare at [facility]  CURRENT THERAPY: Maintenance systemic chemotherapy with single agent Alimta 500 MG/M2 every 3 weeks. Status post 143 cycles.  INTERVAL HISTORY: Danielle [name] 65 y.o. female returns to the clinic today for follow-up visit. The patient is feeling fine today with no specific complaints except for mild cough. Several family members have flulike symptoms. She denied having any fever or chills. She has no nausea or vomiting. She denied having any significant chest pain, shortness of breath, cough or hemoptysis. She has no significant weight loss or night sweats. She tolerated the last cycle of her treatment well  and she is here to start cycle #144.  MEDICAL HISTORY: Past Medical History:  Diagnosis Date  . Anemia   . CVA (cerebral vascular accident) ([place]) 04/2014   pt states had 2 cva's within 2 wks  . DJD (degenerative joint disease), cervical   . Fibromyalgia   . H/O: pneumonia   . History of tobacco abuse quit 10/08   on nicotine patch  . Hypertension   . Hypokalemia   . Lung cancer ([place]) dx'd 09/2005   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 had chemo and radiation for lung ca  . Thrush 12/11/2010    ALLERGIES:  is allergic to contrast media [iodinated diagnostic agents]; iohexol; sulfa drugs cross reactors; and co-trimoxazole injection [sulfamethoxazole-trimethoprim].  MEDICATIONS:  Current Outpatient Prescriptions  Medication Sig Dispense Refill  . ALPRAZolam (XANAX) 0.25 MG tablet Take 1 tablet (0.25 mg total) by mouth at bedtime as needed. for sleep 30 tablet 0  . amLODipine (NORVASC) 10 MG tablet TAKE 1 TABLET (10 MG TOTAL) BY MOUTH DAILY. 90 tablet 1  . aspirin EC 325 MG tablet TAKE 1 TABLET (325 MG TOTAL) BY MOUTH DAILY. 90 tablet 0  . atorvastatin (LIPITOR) 40 MG tablet TAKE 1 TABLET BY MOUTH EVERY DAY AT 6 PM 90 tablet 2  . [facility] ALLERGY 25 MG capsule TAKE 1 CAPSULE (25 MG TOTAL) BY MOUTH ONCE. 2 HOURS BEFORE CT SCAN ON 09/18/2015  0  . FeFum-FePoly-FA-B Cmp-C-Biot (INTEGRA PLUS) CAPS Take 1 capsule by mouth every morning. 90 capsule 1  . fluticasone (FLONASE) 50 MCG/ACT nasal spray Place 1-2 sprays into both nostrils daily as needed.     . folic acid (FOLVITE) 1 MG tablet TAKE 1  TABLET BY MOUTH DAILY. 90 tablet 1  . furosemide (LASIX) 20 MG tablet TAKE 1 TABLET (20 MG TOTAL) BY MOUTH DAILY AS NEEDED FOR EDEMA. 20 tablet 0  . lidocaine-prilocaine (EMLA) cream Apply to Port-A-Cath as directed 1-2 hours prior to chemotherapy 30 g 1  . lisinopril (PRINIVIL,ZESTRIL) 20 MG tablet TAKE 2 TABLETS (40 MG TOTAL) BY MOUTH DAILY. 60 tablet 2  . morphine (MS CONTIN) 60 MG 12 hr tablet Take  1 tablet (60 mg total) by mouth 2 (two) times daily. 60 tablet 0  . morphine (MSIR) 30 MG tablet Take 1 tablet (30 mg total) by mouth every 4 (four) hours as needed (breakthrough pain). 60 tablet 0  . Olopatadine HCl 0.2 % SOLN Apply 1 drop to eye daily. 2.5 mL 11  . ondansetron (ZOFRAN) 8 MG tablet TAKE 1 TABLET (8 MG TOTAL) BY MOUTH 2 (TWO) TIMES DAILY AS NEEDED FOR NAUSEA OR VOMITING. 30 tablet 2  . predniSONE (DELTASONE) 50 MG tablet Take 1 tablet (50 mg total) by mouth See admin instructions. Take 1 tablet (50 mg) 13 hours before CT scan, 7 hours before CT scan and 1 hour before CT scan (every 9 weeks) 3 tablet 1  . diphenhydrAMINE (BENADRYL) 25 MG tablet Take 1 tablet (25 mg total) by mouth once. 2 hours before CT scan on 09/18/2015 10 tablet 0   No current facility-administered medications for this visit.     SURGICAL HISTORY:  Past Surgical History:  Procedure Laterality Date  . CERVICAL LAMINECTOMY  1995  . KNEE SURGERY  1990   Left x 2  . 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 LOOP RECORDER IMPLANT N/A 04/19/2014   Procedure: LOOP RECORDER IMPLANT;  Surgeon: Thompson Grayer, MD;  Location: [facility] CATH LAB;  Service: Cardiovascular;  Laterality: N/A;  . OTHER SURGICAL HISTORY  2008   Gamma knife surgery to remove brain met   . PORTACATH PLACEMENT  -[name]   TIP IN CAVOATRIAL JUNCTION  . TEE WITHOUT CARDIOVERSION N/A 04/19/2014   Procedure: TRANSESOPHAGEAL ECHOCARDIOGRAM (TEE);  Surgeon: Larey Dresser, MD;  Location: [place];  Service: Cardiovascular;  Laterality: N/A;    REVIEW OF SYSTEMS:  A comprehensive review of systems was negative except for: Respiratory: positive for cough   PHYSICAL EXAMINATION: General appearance: alert, cooperative and no distress Head: Normocephalic, without obvious abnormality, atraumatic Neck: no adenopathy, no JVD, supple, symmetrical, trachea midline and thyroid not enlarged, symmetric, no  tenderness/mass/nodules Lymph nodes: Cervical, supraclavicular, and axillary nodes normal. Resp: clear to auscultation bilaterally Back: symmetric, no curvature. ROM normal. No CVA tenderness. Cardio: regular rate and rhythm, S1, S2 normal, no murmur, click, rub or gallop GI: soft, non-tender; bowel sounds normal; no masses,  no organomegaly Extremities: extremities normal, atraumatic, no cyanosis or edema  ECOG PERFORMANCE STATUS: 1 - Symptomatic but completely ambulatory  Blood pressure (!) 129/55, pulse 88, temperature 98.2 F (36.8 C), temperature source Oral, resp. rate 18, height 5' 3" (1.6 m), weight 137 lb 1.6 oz (62.2 kg), SpO2 97 %.  LABORATORY DATA: Lab Results  Component Value Date   WBC 10.9 (H) 12/12/2015   HGB 11.8 12/12/2015   HCT 36.3 12/12/2015   MCV 95.0 12/12/2015   PLT 341 12/12/2015      Chemistry      Component Value Date/Time   NA 142 11/21/2015 0819   K 3.7 11/21/2015 0819   CL 103 01/26/2015 0818   CL 106  06/30/2012 1004   CO2 24 11/21/2015 0819   BUN 6.4 (L) 11/21/2015 0819   CREATININE 0.8 11/21/2015 0819      Component Value Date/Time   CALCIUM 9.8 11/21/2015 0819   ALKPHOS 101 11/21/2015 0819   AST 20 11/21/2015 0819   ALT 11 11/21/2015 0819   BILITOT 0.47 11/21/2015 0819       RADIOGRAPHIC STUDIES: No results found.  ASSESSMENT AND PLAN: This is a very pleasant 65 years old white female with metastatic non-small cell lung cancer status post concurrent chemoradiation as well as palliative radiotherapy to metastatic brain lesion. She is currently undergoing maintenance treatment with single agent Alimta status post 143 cycles. The patient is tolerating her treatment well with no significant adverse effects. I recommended for her to proceed with cycle #144 today as scheduled. She will come back for follow-up visit in 3 weeks for evaluation before starting cycle 145. For pain management she will was given a refill of her pain medication  today. The patient was advised to call immediately if she has any concerning symptoms in the interval. The patient voices understanding of current disease status and treatment options and is in agreement with the current care plan.  All questions were answered. The patient knows to call the clinic with any problems, questions or concerns. We can certainly see the patient much sooner if necessary.  Disclaimer: This note was dictated with voice recognition software. Similar sounding words can inadvertently be transcribed and may not be corrected upon review.

## 2015-12-12 NOTE — Telephone Encounter (Signed)
Appointments scheduled per, 12/12/15 los. A copy of the AVS report and appointment schedule was given to the patient, per 12/12/15 los.

## 2015-12-24 LAB — CUP PACEART REMOTE DEVICE CHECK
Date Time Interrogation Session: 20171104001040
Implantable Pulse Generator Implant Date: 20160412

## 2015-12-24 NOTE — Progress Notes (Signed)
[facility] summary report received. Battery status OK. Normal device function. No new symptom episodes, tachy episodes, brady, or pause episodes. No new AF episodes. Monthly summary reports and ROV/PRN 

## 2015-12-25 ENCOUNTER — Other Ambulatory Visit: Payer: Self-pay | Admitting: Medical Oncology

## 2015-12-25 DIAGNOSIS — F411 Generalized anxiety disorder: Secondary | ICD-10-CM

## 2015-12-25 MED ORDER — ALPRAZOLAM 0.25 MG PO TABS: 0.2500 mg | ORAL_TABLET | Freq: Every evening | ORAL | 0 refills | Status: DC | PRN

## 2015-12-27 ENCOUNTER — Other Ambulatory Visit: Payer: Self-pay | Admitting: Internal Medicine

## 2015-12-28 ENCOUNTER — Other Ambulatory Visit: Payer: Self-pay | Admitting: Medical Oncology

## 2015-12-28 NOTE — Progress Notes (Signed)
Called in xanax again.

## 2015-12-29 ENCOUNTER — Other Ambulatory Visit: Payer: Self-pay | Admitting: Internal Medicine

## 2015-12-29 DIAGNOSIS — C7931 Secondary malignant neoplasm of brain: Secondary | ICD-10-CM

## 2015-12-29 DIAGNOSIS — C3411 Malignant neoplasm of upper lobe, right bronchus or lung: Secondary | ICD-10-CM

## 2016-01-02 ENCOUNTER — Ambulatory Visit (HOSPITAL_BASED_OUTPATIENT_CLINIC_OR_DEPARTMENT_OTHER): Payer: Medicare Other | Admitting: Internal Medicine

## 2016-01-02 ENCOUNTER — Ambulatory Visit (HOSPITAL_BASED_OUTPATIENT_CLINIC_OR_DEPARTMENT_OTHER): Payer: Medicare Other

## 2016-01-02 ENCOUNTER — Encounter: Payer: Self-pay | Admitting: Internal Medicine

## 2016-01-02 ENCOUNTER — Telehealth: Payer: Self-pay | Admitting: Internal Medicine

## 2016-01-02 ENCOUNTER — Other Ambulatory Visit (HOSPITAL_BASED_OUTPATIENT_CLINIC_OR_DEPARTMENT_OTHER): Payer: Medicare Other

## 2016-01-02 VITALS — BP 146/65 | HR 98 | Temp 97.9°F | Resp 16 | Ht 63.0 in | Wt 141.7 lb

## 2016-01-02 DIAGNOSIS — C3411 Malignant neoplasm of upper lobe, right bronchus or lung: Secondary | ICD-10-CM

## 2016-01-02 DIAGNOSIS — C7931 Secondary malignant neoplasm of brain: Secondary | ICD-10-CM | POA: Diagnosis not present

## 2016-01-02 DIAGNOSIS — I10 Essential (primary) hypertension: Secondary | ICD-10-CM

## 2016-01-02 DIAGNOSIS — Z5111 Encounter for antineoplastic chemotherapy: Secondary | ICD-10-CM

## 2016-01-02 DIAGNOSIS — C3491 Malignant neoplasm of unspecified part of right bronchus or lung: Secondary | ICD-10-CM

## 2016-01-02 LAB — CBC WITH DIFFERENTIAL/PLATELET
BASO%: 0.1 % (ref 0.0–2.0)
Basophils Absolute: 0 10*3/uL (ref 0.0–0.1)
EOS ABS: 0.1 10*3/uL (ref 0.0–0.5)
EOS%: 1 % (ref 0.0–7.0)
HEMATOCRIT: 36.1 % (ref 34.8–46.6)
HEMOGLOBIN: 11.7 g/dL (ref 11.6–15.9)
LYMPH%: 26 % (ref 14.0–49.7)
MCH: 31.1 pg (ref 25.1–34.0)
MCHC: 32.4 g/dL (ref 31.5–36.0)
MCV: 96 fL (ref 79.5–101.0)
MONO#: 0.8 10*3/uL (ref 0.1–0.9)
MONO%: 9.4 % (ref 0.0–14.0)
NEUT%: 63.5 % (ref 38.4–76.8)
NEUTROS ABS: 5.5 10*3/uL (ref 1.5–6.5)
Platelets: 308 10*3/uL (ref 145–400)
RBC: 3.76 10*6/uL (ref 3.70–5.45)
RDW: 14.5 % (ref 11.2–14.5)
WBC: 8.7 10*3/uL (ref 3.9–10.3)
lymph#: 2.3 10*3/uL (ref 0.9–3.3)

## 2016-01-02 LAB — COMPREHENSIVE METABOLIC PANEL
ALBUMIN: 3.3 g/dL — AB (ref 3.5–5.0)
ALK PHOS: 93 U/L (ref 40–150)
ALT: 15 U/L (ref 0–55)
AST: 22 U/L (ref 5–34)
Anion Gap: 7 mEq/L (ref 3–11)
BILIRUBIN TOTAL: 0.45 mg/dL (ref 0.20–1.20)
BUN: 11.8 mg/dL (ref 7.0–26.0)
CALCIUM: 9.6 mg/dL (ref 8.4–10.4)
CO2: 24 mEq/L (ref 22–29)
Chloride: 110 mEq/L — ABNORMAL HIGH (ref 98–109)
Creatinine: 0.8 mg/dL (ref 0.6–1.1)
EGFR: 82 mL/min/{1.73_m2} — AB (ref >90)
GLUCOSE: 119 mg/dL (ref 70–140)
Potassium: 4.2 mEq/L (ref 3.5–5.1)
SODIUM: 141 meq/L (ref 136–145)
TOTAL PROTEIN: 6.8 g/dL (ref 6.4–8.3)

## 2016-01-02 MED ORDER — HEPARIN SOD (PORK) LOCK FLUSH 100 UNIT/ML IV SOLN
500.0000 [IU] | Freq: Once | INTRAVENOUS | Status: AC | PRN
  Administered 2016-01-02: 500 [IU]
  Filled 2016-01-02: qty 5

## 2016-01-02 MED ORDER — SODIUM CHLORIDE 0.9 % IJ SOLN
10.0000 mL | INTRAMUSCULAR | Status: DC | PRN
  Administered 2016-01-02: 10 mL
  Filled 2016-01-02: qty 10

## 2016-01-02 MED ORDER — DEXAMETHASONE SODIUM PHOSPHATE 10 MG/ML IJ SOLN
10.0000 mg | Freq: Once | INTRAMUSCULAR | Status: AC
  Administered 2016-01-02: 10 mg via INTRAVENOUS

## 2016-01-02 MED ORDER — PREDNISONE 50 MG PO TABS: ORAL_TABLET | ORAL | 0 refills | Status: DC

## 2016-01-02 MED ORDER — ONDANSETRON HCL 8 MG PO TABS
ORAL_TABLET | ORAL | Status: AC
  Filled 2016-01-02: qty 1

## 2016-01-02 MED ORDER — SODIUM CHLORIDE 0.9 % IV SOLN
500.0000 mg/m2 | Freq: Once | INTRAVENOUS | Status: AC
  Administered 2016-01-02: 900 mg via INTRAVENOUS
  Filled 2016-01-02: qty 16

## 2016-01-02 MED ORDER — ONDANSETRON HCL 8 MG PO TABS
8.0000 mg | ORAL_TABLET | Freq: Once | ORAL | Status: AC
  Administered 2016-01-02: 8 mg via ORAL

## 2016-01-02 MED ORDER — SODIUM CHLORIDE 0.9 % IV SOLN
Freq: Once | INTRAVENOUS | Status: AC
  Administered 2016-01-02: 10:00:00 via INTRAVENOUS

## 2016-01-02 MED ORDER — DEXAMETHASONE SODIUM PHOSPHATE 10 MG/ML IJ SOLN
INTRAMUSCULAR | Status: AC
  Filled 2016-01-02: qty 1

## 2016-01-02 NOTE — Patient Instructions (Signed)
[facility] Discharge Instructions for Patients Receiving Chemotherapy  Today you received the following chemotherapy agents Alimta.  To help prevent nausea and vomiting after your treatment, we encourage you to take your nausea medication as prescribed.   If you develop nausea and vomiting that is not controlled by your nausea medication, call the clinic.   BELOW ARE SYMPTOMS THAT SHOULD BE REPORTED IMMEDIATELY:  *FEVER GREATER THAN 100.5 F  *CHILLS WITH OR WITHOUT FEVER  NAUSEA AND VOMITING THAT IS NOT CONTROLLED WITH YOUR NAUSEA MEDICATION  *UNUSUAL SHORTNESS OF BREATH  *UNUSUAL BRUISING OR BLEEDING  TENDERNESS IN MOUTH AND THROAT WITH OR WITHOUT PRESENCE OF ULCERS  *URINARY PROBLEMS  *BOWEL PROBLEMS  UNUSUAL RASH Items with * indicate a potential emergency and should be followed up as soon as possible.  Feel free to call the clinic you have any questions or concerns. The clinic phone number is [phone].  Please show the CHEMO ALERT CARD at check-in to the Emergency Department and triage nurse.   

## 2016-01-02 NOTE — Telephone Encounter (Signed)
Appointments scheduled per 12/26 LOS. Patient given AVS report and calendars with future scheduled appointments. Patient aware of CT scan, given two bottles of contrast and instructions.

## 2016-01-02 NOTE — Progress Notes (Signed)
[place] Telephone:([phone]   Fax:(336) [phone]  OFFICE PROGRESS NOTE  [name], MD [place] [address]  DIAGNOSIS: Metastatic non-small cell lung cancer initially diagnosed as locally advanced stage IIIB with a right Pancoast tumor involving the vertebral body as well as foraminal canal invasion with spinal cord compression in October 2007.  PRIOR THERAPY: 1. Status post concurrent chemoradiation with weekly carboplatin and paclitaxel, last dose was given November 18, 2005. 2. 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 3. Status post gamma knife radiotherapy to a solitary brain lesion located in the superior frontal area of the brain at [facility] in April of 2008. 4. Status post palliative radiotherapy to the lateral abdominal wall metastatic lesion under the care of Dr. Lisbeth Renshaw, completed March of 2009. 5. Status post 6 cycles of systemic chemotherapy with carboplatin and Alimta. Last dose was given July 26, 2007 with disease stabilization. 6. Gamma knife stereotactic radiotherapy to 2 brain lesions one involving the right frontal dural based as well as right parietal lesion performed on 05/07/2012 under the care of Dr. Vallarie Mare at [facility].  CURRENT THERAPY: Maintenance systemic chemotherapy with single agent Alimta 500 MG/M2 every 3 weeks. Status post 144 cycles.  INTERVAL HISTORY: Danielle [name] 65 y.o. female follow-up visit. The patient is feeling fine today with no specific complaints except for mild fatigue and occasional nausea. Her nausea improved with Compazine. She denied having any chest pain but has shortness of breath with exertion with no cough or hemoptysis. She denied having any fever or chills. She has no weight loss or night sweats. She is here today for evaluation before starting cycle #145 of her maintenance  treatment.  MEDICAL HISTORY: Past Medical History:  Diagnosis Date  . Anemia   . CVA (cerebral vascular accident) ([place]) 04/2014   pt states had 2 cva's within 2 wks  . DJD (degenerative joint disease), cervical   . Fibromyalgia   . H/O: pneumonia   . History of tobacco abuse quit 10/08   on nicotine patch  . Hypertension   . Hypokalemia   . Lung cancer ([place]) dx'd 09/2005   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 had chemo and radiation for lung ca  . Thrush 12/11/2010    ALLERGIES:  is allergic to contrast media [iodinated diagnostic agents]; iohexol; sulfa drugs cross reactors; and co-trimoxazole injection [sulfamethoxazole-trimethoprim].  MEDICATIONS:  Current Outpatient Prescriptions  Medication Sig Dispense Refill  . ALPRAZolam (XANAX) 0.25 MG tablet Take 1 tablet (0.25 mg total) by mouth at bedtime as needed. for sleep 30 tablet 0  . amLODipine (NORVASC) 10 MG tablet TAKE 1 TABLET (10 MG TOTAL) BY MOUTH DAILY. 90 tablet 1  . aspirin EC 325 MG tablet TAKE 1 TABLET (325 MG TOTAL) BY MOUTH DAILY. 90 tablet 0  . atorvastatin (LIPITOR) 40 MG tablet TAKE 1 TABLET BY MOUTH EVERY DAY AT 6 PM 90 tablet 2  . [facility] ALLERGY 25 MG capsule TAKE 1 CAPSULE (25 MG TOTAL) BY MOUTH ONCE. 2 HOURS BEFORE CT SCAN ON 09/18/2015  0  . diphenhydrAMINE (BENADRYL) 25 MG tablet Take 1 tablet (25 mg total) by mouth once. 2 hours before CT scan on 09/18/2015 10 tablet 0  . FeFum-FePoly-FA-B Cmp-C-Biot (INTEGRA PLUS) CAPS Take 1 capsule by mouth every morning. 90 capsule 1  . fluticasone (FLONASE) 50 MCG/ACT nasal spray Place 1-2 sprays into both  nostrils daily as needed.     . folic acid (FOLVITE) 1 MG tablet TAKE 1 TABLET BY MOUTH DAILY. 90 tablet 1  . furosemide (LASIX) 20 MG tablet TAKE 1 TABLET (20 MG TOTAL) BY MOUTH DAILY AS NEEDED FOR EDEMA. 20 tablet 0  . lidocaine-prilocaine (EMLA) cream Apply to Port-A-Cath as directed 1-2 hours prior to chemotherapy 30 g 1  . lisinopril (PRINIVIL,ZESTRIL) 20 MG  tablet TAKE 2 TABLETS (40 MG TOTAL) BY MOUTH DAILY. 60 tablet 2  . morphine (MS CONTIN) 60 MG 12 hr tablet Take 1 tablet (60 mg total) by mouth 2 (two) times daily. 60 tablet 0  . morphine (MSIR) 30 MG tablet Take 1 tablet (30 mg total) by mouth every 4 (four) hours as needed (breakthrough pain). 60 tablet 0  . Olopatadine HCl 0.2 % SOLN Apply 1 drop to eye daily. 2.5 mL 11  . ondansetron (ZOFRAN) 8 MG tablet TAKE 1 TABLET (8 MG TOTAL) BY MOUTH 2 (TWO) TIMES DAILY AS NEEDED FOR NAUSEA OR VOMITING. 30 tablet 2  . predniSONE (DELTASONE) 50 MG tablet Take 1 tablet (50 mg total) by mouth See admin instructions. Take 1 tablet (50 mg) 13 hours before CT scan, 7 hours before CT scan and 1 hour before CT scan (every 9 weeks) 3 tablet 1   No current facility-administered medications for this visit.     SURGICAL HISTORY:  Past Surgical History:  Procedure Laterality Date  . CERVICAL LAMINECTOMY  1995  . KNEE SURGERY  1990   Left x 2  . 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 LOOP RECORDER IMPLANT N/A 04/19/2014   Procedure: LOOP RECORDER IMPLANT;  Surgeon: Thompson Grayer, MD;  Location: [facility] CATH LAB;  Service: Cardiovascular;  Laterality: N/A;  . OTHER SURGICAL HISTORY  2008   Gamma knife surgery to remove brain met   . PORTACATH PLACEMENT  -[name]   TIP IN CAVOATRIAL JUNCTION  . TEE WITHOUT CARDIOVERSION N/A 04/19/2014   Procedure: TRANSESOPHAGEAL ECHOCARDIOGRAM (TEE);  Surgeon: Larey Dresser, MD;  Location: [place];  Service: Cardiovascular;  Laterality: N/A;    REVIEW OF SYSTEMS:  A comprehensive review of systems was negative except for: Constitutional: positive for fatigue Respiratory: positive for dyspnea on exertion   PHYSICAL EXAMINATION: General appearance: alert, cooperative and no distress Head: Normocephalic, without obvious abnormality, atraumatic Neck: no adenopathy, no JVD, supple, symmetrical, trachea midline and thyroid not enlarged, symmetric,  no tenderness/mass/nodules Lymph nodes: Cervical, supraclavicular, and axillary nodes normal. Resp: clear to auscultation bilaterally Back: symmetric, no curvature. ROM normal. No CVA tenderness. Cardio: regular rate and rhythm, S1, S2 normal, no murmur, click, rub or gallop GI: soft, non-tender; bowel sounds normal; no masses,  no organomegaly Extremities: extremities normal, atraumatic, no cyanosis or edema  ECOG PERFORMANCE STATUS: 1 - Symptomatic but completely ambulatory  Blood pressure (!) 146/65, pulse 98, temperature 97.9 F (36.6 C), temperature source Oral, resp. rate 16, height _0  (1.6 m), weight 141 lb 11.2 oz (64.3 kg), SpO2 99 %.  LABORATORY DATA: Lab Results  Component Value Date   WBC 8.7 01/02/2016   HGB 11.7 01/02/2016   HCT 36.1 01/02/2016   MCV 96.0 01/02/2016   PLT 308 01/02/2016      Chemistry      Component Value Date/Time   NA 142 12/12/2015 0822   K 4.1 12/12/2015 0822   CL 103 01/26/2015 0818   CL 106 06/30/2012 1004  CO2 27 12/12/2015 0822   BUN 12.2 12/12/2015 0822   CREATININE 0.8 12/12/2015 0822      Component Value Date/Time   CALCIUM 9.5 12/12/2015 0822   ALKPHOS 98 12/12/2015 0822   AST 22 12/12/2015 0822   ALT 12 12/12/2015 0822   BILITOT 0.38 12/12/2015 0822       RADIOGRAPHIC STUDIES: No results found.  ASSESSMENT AND PLAN: This is a very pleasant 65 years old white female with metastatic non-small cell lung cancer status post chemotherapy and radiation regimens and she is currently on maintenance treatment with Alimta is status post 144 cycles. She has been tolerating her treatment well with no significant adverse effects. I recommended for the patient to proceed with cycle 145 today. She will come back for follow-up visit in 3 weeks for evaluation after repeating CT scan of the chest, abdomen and pelvis for restaging of her disease before starting cycle #146. The patient was advised to call immediately if she has any  concerning symptoms in the interval. The patient voices understanding of current disease status and treatment options and is in agreement with the current care plan. All questions were answered. The patient knows to call the clinic with any problems, questions or concerns. We can certainly see the patient much sooner if necessary. I spent 10 minutes counseling the patient face to face. The total time spent in the appointment was 15 minutes.  Disclaimer: This note was dictated with voice recognition software. Similar sounding words can inadvertently be transcribed and may not be corrected upon review.

## 2016-01-07 ENCOUNTER — Other Ambulatory Visit: Payer: Self-pay | Admitting: Family Medicine

## 2016-01-09 ENCOUNTER — Ambulatory Visit (INDEPENDENT_AMBULATORY_CARE_PROVIDER_SITE_OTHER): Payer: Medicare Other | Admitting: *Deleted

## 2016-01-09 DIAGNOSIS — I639 Cerebral infarction, unspecified: Secondary | ICD-10-CM

## 2016-01-11 NOTE — Progress Notes (Signed)
[facility] Summary Report / Loop Recorder 

## 2016-01-15 ENCOUNTER — Other Ambulatory Visit: Payer: Self-pay | Admitting: Medical Oncology

## 2016-01-15 DIAGNOSIS — C3490 Malignant neoplasm of unspecified part of unspecified bronchus or lung: Secondary | ICD-10-CM

## 2016-01-15 DIAGNOSIS — C3410 Malignant neoplasm of upper lobe, unspecified bronchus or lung: Secondary | ICD-10-CM

## 2016-01-15 MED ORDER — MORPHINE SULFATE ER 60 MG PO TBCR: 60.0000 mg | EXTENDED_RELEASE_TABLET | Freq: Two times a day (BID) | ORAL | 0 refills | Status: DC

## 2016-01-15 MED ORDER — MORPHINE SULFATE 30 MG PO TABS: 30.0000 mg | ORAL_TABLET | ORAL | 0 refills | Status: DC | PRN

## 2016-01-18 LAB — CUP PACEART REMOTE DEVICE CHECK
Implantable Pulse Generator Implant Date: 20160412
MDC IDC SESS DTM: 20171204010802

## 2016-01-19 ENCOUNTER — Encounter (HOSPITAL_COMMUNITY): Payer: Self-pay

## 2016-01-19 ENCOUNTER — Ambulatory Visit (HOSPITAL_COMMUNITY)
Admission: RE | Admit: 2016-01-19 | Discharge: 2016-01-19 | Disposition: A | Discharge status: Home / Self Care | Payer: Medicare Other | Source: Ambulatory Visit | Attending: Internal Medicine | Admitting: Internal Medicine

## 2016-01-19 DIAGNOSIS — C3431 Malignant neoplasm of lower lobe, right bronchus or lung: Secondary | ICD-10-CM | POA: Diagnosis not present

## 2016-01-19 DIAGNOSIS — C3490 Malignant neoplasm of unspecified part of unspecified bronchus or lung: Secondary | ICD-10-CM | POA: Diagnosis not present

## 2016-01-19 DIAGNOSIS — C3491 Malignant neoplasm of unspecified part of right bronchus or lung: Secondary | ICD-10-CM

## 2016-01-19 DIAGNOSIS — I10 Essential (primary) hypertension: Secondary | ICD-10-CM | POA: Insufficient documentation

## 2016-01-19 DIAGNOSIS — Z5111 Encounter for antineoplastic chemotherapy: Secondary | ICD-10-CM

## 2016-01-19 DIAGNOSIS — C3411 Malignant neoplasm of upper lobe, right bronchus or lung: Secondary | ICD-10-CM

## 2016-01-19 DIAGNOSIS — C7931 Secondary malignant neoplasm of brain: Secondary | ICD-10-CM | POA: Insufficient documentation

## 2016-01-19 MED ORDER — IOPAMIDOL (ISOVUE-300) INJECTION 61%
INTRAVENOUS | Status: AC
  Administered 2016-01-19: 75 mL
  Filled 2016-01-19: qty 75

## 2016-01-23 ENCOUNTER — Ambulatory Visit (HOSPITAL_BASED_OUTPATIENT_CLINIC_OR_DEPARTMENT_OTHER): Payer: Medicare Other | Admitting: Internal Medicine

## 2016-01-23 ENCOUNTER — Ambulatory Visit (HOSPITAL_BASED_OUTPATIENT_CLINIC_OR_DEPARTMENT_OTHER): Payer: Medicare Other

## 2016-01-23 ENCOUNTER — Other Ambulatory Visit (HOSPITAL_BASED_OUTPATIENT_CLINIC_OR_DEPARTMENT_OTHER): Payer: Medicare Other

## 2016-01-23 ENCOUNTER — Telehealth: Payer: Self-pay | Admitting: Medical Oncology

## 2016-01-23 ENCOUNTER — Encounter: Payer: Self-pay | Admitting: Internal Medicine

## 2016-01-23 VITALS — BP 142/61 | HR 81 | Temp 97.7°F | Resp 16 | Wt 138.1 lb

## 2016-01-23 DIAGNOSIS — C3491 Malignant neoplasm of unspecified part of right bronchus or lung: Secondary | ICD-10-CM

## 2016-01-23 DIAGNOSIS — I10 Essential (primary) hypertension: Secondary | ICD-10-CM

## 2016-01-23 DIAGNOSIS — F419 Anxiety disorder, unspecified: Secondary | ICD-10-CM | POA: Diagnosis not present

## 2016-01-23 DIAGNOSIS — Z5111 Encounter for antineoplastic chemotherapy: Secondary | ICD-10-CM

## 2016-01-23 DIAGNOSIS — C3411 Malignant neoplasm of upper lobe, right bronchus or lung: Secondary | ICD-10-CM

## 2016-01-23 DIAGNOSIS — D6481 Anemia due to antineoplastic chemotherapy: Secondary | ICD-10-CM | POA: Diagnosis not present

## 2016-01-23 DIAGNOSIS — G893 Neoplasm related pain (acute) (chronic): Secondary | ICD-10-CM | POA: Diagnosis not present

## 2016-01-23 DIAGNOSIS — C7931 Secondary malignant neoplasm of brain: Secondary | ICD-10-CM | POA: Diagnosis not present

## 2016-01-23 DIAGNOSIS — T451X5A Adverse effect of antineoplastic and immunosuppressive drugs, initial encounter: Secondary | ICD-10-CM

## 2016-01-23 HISTORY — DX: Adverse effect of antineoplastic and immunosuppressive drugs, initial encounter: D64.81

## 2016-01-23 LAB — COMPREHENSIVE METABOLIC PANEL
ALT: 11 U/L (ref 0–55)
ANION GAP: 8 meq/L (ref 3–11)
AST: 17 U/L (ref 5–34)
Albumin: 3.4 g/dL — ABNORMAL LOW (ref 3.5–5.0)
Alkaline Phosphatase: 90 U/L (ref 40–150)
BUN: 13.3 mg/dL (ref 7.0–26.0)
CHLORIDE: 107 meq/L (ref 98–109)
CO2: 26 meq/L (ref 22–29)
CREATININE: 0.8 mg/dL (ref 0.6–1.1)
Calcium: 10 mg/dL (ref 8.4–10.4)
EGFR: 79 mL/min/{1.73_m2} — ABNORMAL LOW (ref >90)
GLUCOSE: 112 mg/dL (ref 70–140)
Potassium: 4.5 mEq/L (ref 3.5–5.1)
SODIUM: 141 meq/L (ref 136–145)
Total Bilirubin: 0.41 mg/dL (ref 0.20–1.20)
Total Protein: 7.2 g/dL (ref 6.4–8.3)

## 2016-01-23 LAB — CBC WITH DIFFERENTIAL/PLATELET
BASO%: 0.1 % (ref 0.0–2.0)
Basophils Absolute: 0 10*3/uL (ref 0.0–0.1)
EOS%: 0.5 % (ref 0.0–7.0)
Eosinophils Absolute: 0.1 10*3/uL (ref 0.0–0.5)
HCT: 37.5 % (ref 34.8–46.6)
HGB: 12 g/dL (ref 11.6–15.9)
LYMPH%: 16.8 % (ref 14.0–49.7)
MCH: 30.7 pg (ref 25.1–34.0)
MCHC: 32 g/dL (ref 31.5–36.0)
MCV: 95.9 fL (ref 79.5–101.0)
MONO#: 1.2 10*3/uL — ABNORMAL HIGH (ref 0.1–0.9)
MONO%: 10.6 % (ref 0.0–14.0)
NEUT#: 8.3 10*3/uL — ABNORMAL HIGH (ref 1.5–6.5)
NEUT%: 72 % (ref 38.4–76.8)
PLATELETS: 422 10*3/uL — AB (ref 145–400)
RBC: 3.91 10*6/uL (ref 3.70–5.45)
RDW: 14.8 % — ABNORMAL HIGH (ref 11.2–14.5)
WBC: 11.6 10*3/uL — AB (ref 3.9–10.3)
lymph#: 2 10*3/uL (ref 0.9–3.3)

## 2016-01-23 MED ORDER — HEPARIN SOD (PORK) LOCK FLUSH 100 UNIT/ML IV SOLN
500.0000 [IU] | Freq: Once | INTRAVENOUS | Status: AC | PRN
  Administered 2016-01-23: 500 [IU]
  Filled 2016-01-23: qty 5

## 2016-01-23 MED ORDER — DEXAMETHASONE SODIUM PHOSPHATE 10 MG/ML IJ SOLN
10.0000 mg | Freq: Once | INTRAMUSCULAR | Status: AC
  Administered 2016-01-23: 10 mg via INTRAVENOUS

## 2016-01-23 MED ORDER — ONDANSETRON HCL 8 MG PO TABS
8.0000 mg | ORAL_TABLET | Freq: Once | ORAL | Status: AC
  Administered 2016-01-23: 8 mg via ORAL

## 2016-01-23 MED ORDER — SODIUM CHLORIDE 0.9 % IV SOLN
Freq: Once | INTRAVENOUS | Status: AC
  Administered 2016-01-23: 11:00:00 via INTRAVENOUS

## 2016-01-23 MED ORDER — ONDANSETRON HCL 8 MG PO TABS
ORAL_TABLET | ORAL | Status: AC
  Filled 2016-01-23: qty 1

## 2016-01-23 MED ORDER — PEMETREXED DISODIUM CHEMO INJECTION 500 MG
500.0000 mg/m2 | Freq: Once | INTRAVENOUS | Status: AC
  Administered 2016-01-23: 900 mg via INTRAVENOUS
  Filled 2016-01-23: qty 4

## 2016-01-23 MED ORDER — SODIUM CHLORIDE 0.9 % IJ SOLN
10.0000 mL | INTRAMUSCULAR | Status: DC | PRN
  Administered 2016-01-23: 10 mL
  Filled 2016-01-23: qty 10

## 2016-01-23 MED ORDER — DEXAMETHASONE SODIUM PHOSPHATE 10 MG/ML IJ SOLN
INTRAMUSCULAR | Status: AC
  Filled 2016-01-23: qty 1

## 2016-01-23 NOTE — Progress Notes (Signed)
[facility] [phone]   [phone]  OFFICE PROGRESS NOTE  [name], MD [place] [address]  DIAGNOSIS: Metastatic non-small cell lung cancer initially diagnosed as locally advanced stage IIIB with a right Pancoast tumor involving the vertebral body as well as prominent canal invasion with spinal cord compression in October 2007. The patient also has metastatic disease to the brain in April 2008.  PRIOR THERAPY: 1. Status post concurrent chemoradiation with weekly carboplatin and paclitaxel, last dose was given November 18, 2005. 2. 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 3. Status post gamma knife radiotherapy to a solitary brain lesion located in the superior frontal area of the brain at [facility] in April of 2008. 4. Status post palliative radiotherapy to the lateral abdominal wall metastatic lesion under the care of Dr. Lisbeth Renshaw, completed March of 2009. 5. Status post 6 cycles of systemic chemotherapy with carboplatin and Alimta. Last dose was given July 26, 2007 with disease stabilization. 6. Gamma knife stereotactic radiotherapy to 2 brain lesions one involving the right frontal dural based as well as right parietal lesion performed on 05/07/2012 under the care of Dr. Vallarie Mare at [facility].  CURRENT THERAPY: Maintenance systemic chemotherapy with Alimta 500 MG/M2 every 3 weeks, status post 145 cycles.  INTERVAL HISTORY: Danielle [name] 66 y.o. female returns to the clinic today for follow-up visit. The patient is feeling better today with no specific complaints except for intermittent nausea female in the morning. It resolved with Compazine. She mentions that everybody in her house has been sick with flulike symptoms since Christmas. She denied having any fever or chills. She denied having any chest pain, shortness of breath, cough  or hemoptysis. She lost around 10 pounds since her last visit. The patient denied having any diarrhea or constipation. She is requesting refill of Xanax. She had repeat CT scan of the chest, abdomen and pelvis performed recently and she is here for evaluation and discussion of her scan results.  MEDICAL HISTORY: Past Medical History:  Diagnosis Date  . Anemia   . CVA (cerebral vascular accident) ([place]) 04/2014   pt states had 2 cva's within 2 wks  . DJD (degenerative joint disease), cervical   . Fibromyalgia   . H/O: pneumonia   . History of tobacco abuse quit 10/08   on nicotine patch  . Hypertension   . Hypokalemia   . Lung cancer ([place]) dx'd 09/2005   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 had chemo and radiation for lung ca  . Thrush 12/11/2010    ALLERGIES:  is allergic to contrast media [iodinated diagnostic agents]; iohexol; sulfa drugs cross reactors; and co-trimoxazole injection [sulfamethoxazole-trimethoprim].  MEDICATIONS:  Current Outpatient Prescriptions  Medication Sig Dispense Refill  . ALPRAZolam (XANAX) 0.25 MG tablet Take 1 tablet (0.25 mg total) by mouth at bedtime as needed. for sleep 30 tablet 0  . amLODipine (NORVASC) 10 MG tablet TAKE 1 TABLET (10 MG TOTAL) BY MOUTH DAILY. 90 tablet 1  . aspirin EC 325 MG tablet TAKE 1 TABLET (325 MG TOTAL) BY MOUTH DAILY. 90 tablet 0  . atorvastatin (LIPITOR) 40 MG tablet TAKE 1 TABLET BY MOUTH EVERY DAY AT 6 PM 90 tablet 0  . [facility] ALLERGY 25 MG capsule TAKE 1 CAPSULE (25 MG TOTAL) BY MOUTH ONCE. 2 HOURS BEFORE CT SCAN ON 09/18/2015  0  . diphenhydrAMINE (BENADRYL) 25  MG tablet Take 1 tablet (25 mg total) by mouth once. 2 hours before CT scan on 09/18/2015 10 tablet 0  . FeFum-FePoly-FA-B Cmp-C-Biot (INTEGRA PLUS) CAPS Take 1 capsule by mouth every morning. 90 capsule 1  . fluticasone (FLONASE) 50 MCG/ACT nasal spray Place 1-2 sprays into both nostrils daily as needed.     . folic acid (FOLVITE) 1 MG tablet TAKE 1 TABLET BY  MOUTH DAILY. 90 tablet 1  . furosemide (LASIX) 20 MG tablet TAKE 1 TABLET (20 MG TOTAL) BY MOUTH DAILY AS NEEDED FOR EDEMA. 20 tablet 0  . lidocaine-prilocaine (EMLA) cream Apply to Port-A-Cath as directed 1-2 hours prior to chemotherapy 30 g 1  . lisinopril (PRINIVIL,ZESTRIL) 20 MG tablet TAKE 2 TABLETS (40 MG TOTAL) BY MOUTH DAILY. 60 tablet 2  . morphine (MS CONTIN) 60 MG 12 hr tablet Take 1 tablet (60 mg total) by mouth 2 (two) times daily. 60 tablet 0  . morphine (MSIR) 30 MG tablet Take 1 tablet (30 mg total) by mouth every 4 (four) hours as needed (breakthrough pain). 60 tablet 0  . Olopatadine HCl 0.2 % SOLN Apply 1 drop to eye daily. 2.5 mL 11  . ondansetron (ZOFRAN) 8 MG tablet TAKE 1 TABLET (8 MG TOTAL) BY MOUTH 2 (TWO) TIMES DAILY AS NEEDED FOR NAUSEA OR VOMITING. 30 tablet 2  . predniSONE (DELTASONE) 50 MG tablet Take 1 tablet (50 mg total) by mouth See admin instructions. Take 1 tablet (50 mg) 13 hours before CT scan, 7 hours before CT scan and 1 hour before CT scan (every 9 weeks) 3 tablet 1  . predniSONE (DELTASONE) 50 MG tablet One tablet by mouth 13 hour and 2 hours before the CT scan. 2 tablet 0   No current facility-administered medications for this visit.     SURGICAL HISTORY:  Past Surgical History:  Procedure Laterality Date  . CERVICAL LAMINECTOMY  1995  . KNEE SURGERY  1990   Left x 2  . 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 LOOP RECORDER IMPLANT N/A 04/19/2014   Procedure: LOOP RECORDER IMPLANT;  Surgeon: Thompson Grayer, MD;  Location[facility] CATH LAB;  Service: Cardiovascular;  Laterality: N/A;  . OTHER SURGICAL HISTORY  2008   Gamma knife surgery to remove brain met   . PORTACATH PLACEMENT  -[name]   TIP IN CAVOATRIAL JUNCTION  . TEE WITHOUT CARDIOVERSION N/A 04/19/2014   Procedure: TRANSESOPHAGEAL ECHOCARDIOGRAM (TEE);  Surgeon: Larey Dresser, MD;  Location: [name];  Service: Cardiovascular;  Laterality: N/A;    REVIEW OF  SYSTEMS:  Constitutional: positive for fatigue and weight loss Eyes: negative Ears, nose, mouth, throat, and face: negative Respiratory: negative Cardiovascular: negative Gastrointestinal: positive for nausea Genitourinary:negative Integument/breast: negative Hematologic/lymphatic: negative Musculoskeletal:negative Neurological: negative Behavioral/Psych: negative Endocrine: negative Allergic/Immunologic: negative   PHYSICAL EXAMINATION: General appearance: alert, cooperative and no distress Head: Normocephalic, without obvious abnormality, atraumatic Neck: no adenopathy, no JVD, supple, symmetrical, trachea midline and thyroid not enlarged, symmetric, no tenderness/mass/nodules Lymph nodes: Cervical, supraclavicular, and axillary nodes normal. Resp: clear to auscultation bilaterally Back: symmetric, no curvature. ROM normal. No CVA tenderness. Cardio: regular rate and rhythm, S1, S2 normal, no murmur, click, rub or gallop GI: soft, non-tender; bowel sounds normal; no masses,  no organomegaly Extremities: extremities normal, atraumatic, no cyanosis or edema Neurologic: Alert and oriented X 3, normal strength and tone. Normal symmetric reflexes. Normal coordination and gait  ECOG PERFORMANCE STATUS: 1 - Symptomatic  but completely ambulatory  There were no vitals taken for this visit.  LABORATORY DATA: Lab Results  Component Value Date   WBC 8.7 01/02/2016   HGB 11.7 01/02/2016   HCT 36.1 01/02/2016   MCV 96.0 01/02/2016   PLT 308 01/02/2016      Chemistry      Component Value Date/Time   NA 141 01/02/2016 0819   K 4.2 01/02/2016 0819   CL 103 01/26/2015 0818   CL 106 06/30/2012 1004   CO2 24 01/02/2016 0819   BUN 11.8 01/02/2016 0819   CREATININE 0.8 01/02/2016 0819      Component Value Date/Time   CALCIUM 9.6 01/02/2016 0819   ALKPHOS 93 01/02/2016 0819   AST 22 01/02/2016 0819   ALT 15 01/02/2016 0819   BILITOT 0.45 01/02/2016 0819       RADIOGRAPHIC  STUDIES: Ct Chest W Contrast  Result Date: 01/19/2016 CLINICAL DATA:  Restaging metastatic lung cancer. EXAM: CT CHEST, ABDOMEN, AND PELVIS WITH CONTRAST TECHNIQUE: Multidetector CT imaging of the chest, abdomen and pelvis was performed following the standard protocol during bolus administration of intravenous contrast. CONTRAST:  78m ISOVUE-300 IOPAMIDOL (ISOVUE-300) INJECTION 61% COMPARISON:  Multiple prior CT scans.  The most recent is 09/15/2015 FINDINGS: CT CHEST FINDINGS Chest wall: Left chest wall implant possibly a loop recorder or pacer device. The right Port-A-Cath is stable. No breast masses, supraclavicular or axillary lymphadenopathy. The thyroid gland is grossly normal in stable. Cardiovascular: The heart is normal in size. No pericardial effusion. Stable tortuosity and calcification of the thoracic aorta. No dissection. Stable coronary artery calcifications. Mediastinum/Nodes: No mediastinal or hilar mass or lymphadenopathy. Small scattered lymph nodes are stable. The esophagus is grossly normal. Lungs/Pleura: Stable biapical pleural and parenchymal scarring changes. Measuring approximately 24 x 13 mm. Stable triangular subpleural density in the right upper lobe anteriorly on image number 43 measuring 8 x 7.5 mm. No acute overlying pulmonary process. No new pulmonary nodules. No pleural effusion. Stable lower lobe peribronchial thickening. Musculoskeletal: Stable T7 in T4 vertebral augmentation changes. Stable T3 vertebral plana. No new fractures or bone lesions. CT ABDOMEN PELVIS FINDINGS Hepatobiliary: No focal hepatic lesions or intrahepatic biliary dilatation. The gallbladder is normal. Mild stable common bile duct dilatation. Pancreas: Pancreatic atrophy but no mass or ductal dilatation. Spleen: Normal size.  No focal lesions. Adrenals/Urinary Tract: The adrenal glands and kidneys are unremarkable and stable. No ureteral or bladder calculi. Stomach/Bowel: The stomach, duodenum, small bowel  and colon are grossly normal without oral contrast. No inflammatory changes, mass lesions or obstructive findings. The terminal ileum and appendix are normal. Vascular/Lymphatic: Stable advanced atherosclerotic calcifications involving the aorta and branch vessels. No aneurysm or dissection. No mesenteric or retroperitoneal mass or adenopathy. Reproductive: The uterus and ovaries are normal. Other: No omental or peritoneal surface disease. No ascites or abdominal wall hernia. No inguinal mass or adenopathy. Musculoskeletal: No significant bony findings. IMPRESSION: 1. Stable advanced emphysematous changes involving the chest along with extensive pulmonary scarring. No CT findings suspicious for recurrent tumor or metastatic disease. 2. No mediastinal or hilar mass or adenopathy. 3. No findings for metastatic disease involving the abdomen/pelvis. Electronically Signed   By: PMarijo SanesM.D.   On: 01/19/2016 15:21   Ct Abdomen Pelvis W Contrast  Result Date: 01/19/2016 CLINICAL DATA:  Restaging metastatic lung cancer. EXAM: CT CHEST, ABDOMEN, AND PELVIS WITH CONTRAST TECHNIQUE: Multidetector CT imaging of the chest, abdomen and pelvis was performed following the standard protocol during bolus administration of  intravenous contrast. CONTRAST:  66m ISOVUE-300 IOPAMIDOL (ISOVUE-300) INJECTION 61% COMPARISON:  Multiple prior CT scans.  The most recent is 09/15/2015 FINDINGS: CT CHEST FINDINGS Chest wall: Left chest wall implant possibly a loop recorder or pacer device. The right Port-A-Cath is stable. No breast masses, supraclavicular or axillary lymphadenopathy. The thyroid gland is grossly normal in stable. Cardiovascular: The heart is normal in size. No pericardial effusion. Stable tortuosity and calcification of the thoracic aorta. No dissection. Stable coronary artery calcifications. Mediastinum/Nodes: No mediastinal or hilar mass or lymphadenopathy. Small scattered lymph nodes are stable. The esophagus is  grossly normal. Lungs/Pleura: Stable biapical pleural and parenchymal scarring changes. Measuring approximately 24 x 13 mm. Stable triangular subpleural density in the right upper lobe anteriorly on image number 43 measuring 8 x 7.5 mm. No acute overlying pulmonary process. No new pulmonary nodules. No pleural effusion. Stable lower lobe peribronchial thickening. Musculoskeletal: Stable T7 in T4 vertebral augmentation changes. Stable T3 vertebral plana. No new fractures or bone lesions. CT ABDOMEN PELVIS FINDINGS Hepatobiliary: No focal hepatic lesions or intrahepatic biliary dilatation. The gallbladder is normal. Mild stable common bile duct dilatation. Pancreas: Pancreatic atrophy but no mass or ductal dilatation. Spleen: Normal size.  No focal lesions. Adrenals/Urinary Tract: The adrenal glands and kidneys are unremarkable and stable. No ureteral or bladder calculi. Stomach/Bowel: The stomach, duodenum, small bowel and colon are grossly normal without oral contrast. No inflammatory changes, mass lesions or obstructive findings. The terminal ileum and appendix are normal. Vascular/Lymphatic: Stable advanced atherosclerotic calcifications involving the aorta and branch vessels. No aneurysm or dissection. No mesenteric or retroperitoneal mass or adenopathy. Reproductive: The uterus and ovaries are normal. Other: No omental or peritoneal surface disease. No ascites or abdominal wall hernia. No inguinal mass or adenopathy. Musculoskeletal: No significant bony findings. IMPRESSION: 1. Stable advanced emphysematous changes involving the chest along with extensive pulmonary scarring. No CT findings suspicious for recurrent tumor or metastatic disease. 2. No mediastinal or hilar mass or adenopathy. 3. No findings for metastatic disease involving the abdomen/pelvis. Electronically Signed   By: PMarijo SanesM.D.   On: 01/19/2016 15:21    ASSESSMENT AND PLAN: This is a very pleasant 66years old white female with  metastatic non-small cell lung cancer diagnosed in October 2007 status post concurrent chemoradiation followed by 1 cycle of consolidation chemotherapy with docetaxel followed by stereotactic radiotherapy to metastatic brain lesions. She was also treated with induction systemic chemotherapy with carboplatin and Alimta and has been on maintenance Alimta status post 145 cycles. The patient had repeat CT scan of the chest, abdomen and pelvis performed recently. I personally and independently reviewed the scan images and discuss the results with the patient today. I recommended for the patient to continue her current treatment with Alimta as single agent. She will proceed with cycle #146 today. For anxiety and insomnia, she was given a refill of Xanax. For hypertension, I recommended for the patient to continue her current treatment with lisinopril and Norvasc. She was also advised to monitor her blood pressure closely at home and to report to her primary care physician if the blood pressure is not controlled. For the chemotherapy-induced anemia, the patient will continue treatment with Integra +1 capsule by mouth daily. For pain management she is currently on MS Contin 30 mg by mouth every 12 hours in addition to morphine sulfate 30 mg by mouth every 4 hours as needed for breakthrough pain. The patient was advised to call immediately if she has any concerning symptoms  in the interval. The patient voices understanding of current disease status and treatment options and is in agreement with the current care plan.  All questions were answered. The patient knows to call the clinic with any problems, questions or concerns. We can certainly see the patient much sooner if necessary.  Disclaimer: This note was dictated with voice recognition software. Similar sounding words can inadvertently be transcribed and may not be corrected upon review.

## 2016-01-23 NOTE — Patient Instructions (Signed)
[facility] Discharge Instructions for Patients Receiving Chemotherapy  Today you received the following chemotherapy agents Alimta.  To help prevent nausea and vomiting after your treatment, we encourage you to take your nausea medication as prescribed.   If you develop nausea and vomiting that is not controlled by your nausea medication, call the clinic.   BELOW ARE SYMPTOMS THAT SHOULD BE REPORTED IMMEDIATELY:  *FEVER GREATER THAN 100.5 F  *CHILLS WITH OR WITHOUT FEVER  NAUSEA AND VOMITING THAT IS NOT CONTROLLED WITH YOUR NAUSEA MEDICATION  *UNUSUAL SHORTNESS OF BREATH  *UNUSUAL BRUISING OR BLEEDING  TENDERNESS IN MOUTH AND THROAT WITH OR WITHOUT PRESENCE OF ULCERS  *URINARY PROBLEMS  *BOWEL PROBLEMS  UNUSUAL RASH Items with * indicate a potential emergency and should be followed up as soon as possible.  Feel free to call the clinic you have any questions or concerns. The clinic phone number is [phone].  Please show the CHEMO ALERT CARD at check-in to the Emergency Department and triage nurse.   

## 2016-01-23 NOTE — Telephone Encounter (Signed)
Called in xanax refill

## 2016-01-31 ENCOUNTER — Telehealth: Payer: Self-pay | Admitting: Family Medicine

## 2016-01-31 DIAGNOSIS — E119 Type 2 diabetes mellitus without complications: Secondary | ICD-10-CM

## 2016-01-31 NOTE — Telephone Encounter (Signed)
-----   Message from Ellamae Sia sent at 01/30/2016  2:32 PM EST ----- Regarding: Lab orders for Monday, 1.29.18 Lab orders for f/u labs

## 2016-02-05 ENCOUNTER — Other Ambulatory Visit (INDEPENDENT_AMBULATORY_CARE_PROVIDER_SITE_OTHER): Payer: Medicare Other

## 2016-02-05 DIAGNOSIS — E119 Type 2 diabetes mellitus without complications: Secondary | ICD-10-CM | POA: Diagnosis not present

## 2016-02-05 LAB — LIPID PANEL
CHOLESTEROL: 124 mg/dL (ref 0–200)
HDL: 36.6 mg/dL — AB (ref >39.00)
LDL Cholesterol: 67 mg/dL (ref 0–99)
NonHDL: 87.15
TRIGLYCERIDES: 102 mg/dL (ref 0.0–149.0)
Total CHOL/HDL Ratio: 3
VLDL: 20.4 mg/dL (ref 0.0–40.0)

## 2016-02-05 LAB — HEMOGLOBIN A1C: HEMOGLOBIN A1C: 5.8 % (ref 4.6–6.5)

## 2016-02-08 ENCOUNTER — Ambulatory Visit (INDEPENDENT_AMBULATORY_CARE_PROVIDER_SITE_OTHER): Payer: Medicare Other | Admitting: *Deleted

## 2016-02-08 DIAGNOSIS — I639 Cerebral infarction, unspecified: Secondary | ICD-10-CM

## 2016-02-09 ENCOUNTER — Ambulatory Visit (INDEPENDENT_AMBULATORY_CARE_PROVIDER_SITE_OTHER): Payer: Medicare Other | Admitting: Family Medicine

## 2016-02-09 ENCOUNTER — Encounter: Payer: Self-pay | Admitting: Family Medicine

## 2016-02-09 DIAGNOSIS — I639 Cerebral infarction, unspecified: Secondary | ICD-10-CM

## 2016-02-09 DIAGNOSIS — E7800 Pure hypercholesterolemia, unspecified: Secondary | ICD-10-CM | POA: Diagnosis not present

## 2016-02-09 DIAGNOSIS — E119 Type 2 diabetes mellitus without complications: Secondary | ICD-10-CM | POA: Diagnosis not present

## 2016-02-09 DIAGNOSIS — I10 Essential (primary) hypertension: Secondary | ICD-10-CM

## 2016-02-09 LAB — HM DIABETES FOOT EXAM

## 2016-02-09 MED ORDER — FLUTICASONE PROPIONATE 50 MCG/ACT NA SUSP: 1.0000 | Freq: Every day | NASAL | 5 refills | Status: AC | PRN

## 2016-02-09 NOTE — Progress Notes (Signed)
Pre visit review using our clinic review tool, if applicable. No additional management support is needed unless otherwise documented below in the visit note. 

## 2016-02-09 NOTE — Assessment & Plan Note (Signed)
Well controlled. Continue current medication.  

## 2016-02-09 NOTE — Progress Notes (Signed)
   Subjective:    Patient ID: Danielle [name], female    DOB: 1950-12-01, 66 y.o.   MRN: [id number]  HPI   66 year old female presents for DM follow up.   Diabetes:  Stable control on no med. Using medications without difficulties: Hypoglycemic episodes:? Hyperglycemic episodes:? Feet problems: none Blood Sugars averaging:  Not checking eye exam within last year: Wt Readings from Last 3 Encounters:  02/09/16 136 lb 12 oz (62 kg)  01/23/16 138 lb 1.6 oz (62.6 kg)  01/02/16 141 lb 11.2 oz (64.3 kg)      Elevated Cholesterol:  LDL at goal on atorvastatin,  40 mg moderate dose. Lab Results  Component Value Date   CHOL 124 02/05/2016   HDL 36.60 (L) 02/05/2016   LDLCALC 67 02/05/2016   LDLDIRECT 105.5 08/06/2013   TRIG 102.0 02/05/2016   CHOLHDL 3 02/05/2016  Using medications without problems: none Muscle aches:  none Diet compliance: good Exercise: None Other complaints:  Hypertension:    Good control on current regiumen. BP Readings from Last 3 Encounters:  02/09/16 130/60  01/23/16 (!) 142/61  01/02/16 (!) 146/65  Using medication without problems or lightheadedness:  Occ Chest pain with exertion: none Edema: none Short of breath: stable Average home BPs: 130/70 Other issues:   Review of Systems  Constitutional: Negative for fatigue and fever.  HENT: Negative for ear pain.   Eyes: Negative for pain.  Respiratory: Negative for chest tightness and shortness of breath.   Cardiovascular: Negative for chest pain, palpitations and leg swelling.  Gastrointestinal: Negative for abdominal pain.  Genitourinary: Negative for dysuria.       Objective:   Physical Exam  Constitutional: Vital signs are normal. She appears well-developed and well-nourished. She is cooperative.  Non-toxic appearance. She does not appear ill. No distress.  HENT:  Head: Normocephalic.  Right Ear: Hearing, tympanic membrane, external ear and ear canal normal. Tympanic membrane is not  erythematous, not retracted and not bulging.  Left Ear: Hearing, tympanic membrane, external ear and ear canal normal. Tympanic membrane is not erythematous, not retracted and not bulging.  Nose: No mucosal edema or rhinorrhea. Right sinus exhibits no maxillary sinus tenderness and no frontal sinus tenderness. Left sinus exhibits no maxillary sinus tenderness and no frontal sinus tenderness.  Mouth/Throat: Uvula is midline, oropharynx is clear and moist and mucous membranes are normal.  Eyes: Conjunctivae, EOM and lids are normal. Pupils are equal, round, and reactive to light. Lids are everted and swept, no foreign bodies found.  Neck: Trachea normal and normal range of motion. Neck supple. Carotid bruit is not present. No thyroid mass and no thyromegaly present.  Cardiovascular: Normal rate, regular rhythm, S1 normal, S2 normal, normal heart sounds, intact distal pulses and normal pulses.  Exam reveals no gallop and no friction rub.   No murmur heard. Pulmonary/Chest: Effort normal. No tachypnea. No respiratory distress. She has decreased breath sounds. She has no wheezes. She has no rhonchi. She has no rales.  Unchanged from previous exam  Abdominal: Soft. Normal appearance and bowel sounds are normal. There is no tenderness.  Neurological: She is alert.  Skin: Skin is warm, dry and intact. No rash noted.  Psychiatric: Her speech is normal and behavior is normal. Judgment and thought content normal. Her mood appears not anxious. Cognition and memory are normal. She does not exhibit a depressed mood.          Assessment & Plan:

## 2016-02-09 NOTE — Assessment & Plan Note (Signed)
Diet controlled, Encouraged exercise, weight loss, healthy eating habits.

## 2016-02-09 NOTE — Assessment & Plan Note (Signed)
Good control, on moderate dose statin.

## 2016-02-09 NOTE — Patient Instructions (Signed)
Work on healthy eating  And walk as much as tolearted.  Increase veggie fats in diet.

## 2016-02-12 NOTE — Progress Notes (Signed)
[facility] Summary Report / Loop Recorder 

## 2016-02-13 ENCOUNTER — Ambulatory Visit (HOSPITAL_BASED_OUTPATIENT_CLINIC_OR_DEPARTMENT_OTHER): Payer: Medicare Other | Admitting: Internal Medicine

## 2016-02-13 ENCOUNTER — Encounter: Payer: Self-pay | Admitting: Internal Medicine

## 2016-02-13 ENCOUNTER — Other Ambulatory Visit (HOSPITAL_BASED_OUTPATIENT_CLINIC_OR_DEPARTMENT_OTHER): Payer: Medicare Other

## 2016-02-13 ENCOUNTER — Ambulatory Visit (HOSPITAL_BASED_OUTPATIENT_CLINIC_OR_DEPARTMENT_OTHER): Payer: Medicare Other

## 2016-02-13 VITALS — BP 157/74 | HR 90 | Temp 97.8°F | Resp 18 | Ht 63.0 in | Wt 137.7 lb

## 2016-02-13 DIAGNOSIS — C3491 Malignant neoplasm of unspecified part of right bronchus or lung: Secondary | ICD-10-CM

## 2016-02-13 DIAGNOSIS — C7931 Secondary malignant neoplasm of brain: Secondary | ICD-10-CM

## 2016-02-13 DIAGNOSIS — D6481 Anemia due to antineoplastic chemotherapy: Secondary | ICD-10-CM | POA: Diagnosis not present

## 2016-02-13 DIAGNOSIS — D649 Anemia, unspecified: Secondary | ICD-10-CM

## 2016-02-13 DIAGNOSIS — Z5111 Encounter for antineoplastic chemotherapy: Secondary | ICD-10-CM | POA: Diagnosis not present

## 2016-02-13 DIAGNOSIS — T451X5A Adverse effect of antineoplastic and immunosuppressive drugs, initial encounter: Secondary | ICD-10-CM

## 2016-02-13 DIAGNOSIS — C3410 Malignant neoplasm of upper lobe, unspecified bronchus or lung: Secondary | ICD-10-CM

## 2016-02-13 DIAGNOSIS — Z452 Encounter for adjustment and management of vascular access device: Secondary | ICD-10-CM

## 2016-02-13 DIAGNOSIS — C3490 Malignant neoplasm of unspecified part of unspecified bronchus or lung: Secondary | ICD-10-CM

## 2016-02-13 DIAGNOSIS — C3411 Malignant neoplasm of upper lobe, right bronchus or lung: Secondary | ICD-10-CM

## 2016-02-13 DIAGNOSIS — I10 Essential (primary) hypertension: Secondary | ICD-10-CM | POA: Diagnosis not present

## 2016-02-13 DIAGNOSIS — G893 Neoplasm related pain (acute) (chronic): Secondary | ICD-10-CM | POA: Diagnosis not present

## 2016-02-13 LAB — CBC WITH DIFFERENTIAL/PLATELET
BASO%: 0.6 % (ref 0.0–2.0)
Basophils Absolute: 0.1 10*3/uL (ref 0.0–0.1)
EOS ABS: 0.1 10*3/uL (ref 0.0–0.5)
EOS%: 0.6 % (ref 0.0–7.0)
HEMATOCRIT: 39.9 % (ref 34.8–46.6)
HGB: 13.3 g/dL (ref 11.6–15.9)
LYMPH#: 2.2 10*3/uL (ref 0.9–3.3)
LYMPH%: 21.3 % (ref 14.0–49.7)
MCH: 31.8 pg (ref 25.1–34.0)
MCHC: 33.3 g/dL (ref 31.5–36.0)
MCV: 95.3 fL (ref 79.5–101.0)
MONO#: 1 10*3/uL — AB (ref 0.1–0.9)
MONO%: 9 % (ref 0.0–14.0)
NEUT%: 68.5 % (ref 38.4–76.8)
NEUTROS ABS: 7.2 10*3/uL — AB (ref 1.5–6.5)
PLATELETS: 330 10*3/uL (ref 145–400)
RBC: 4.19 10*6/uL (ref 3.70–5.45)
RDW: 14.7 % — ABNORMAL HIGH (ref 11.2–14.5)
WBC: 10.6 10*3/uL — AB (ref 3.9–10.3)

## 2016-02-13 LAB — COMPREHENSIVE METABOLIC PANEL
ALBUMIN: 3.7 g/dL (ref 3.5–5.0)
ALK PHOS: 90 U/L (ref 40–150)
ALT: 12 U/L (ref 0–55)
ANION GAP: 10 meq/L (ref 3–11)
AST: 22 U/L (ref 5–34)
BILIRUBIN TOTAL: 0.5 mg/dL (ref 0.20–1.20)
BUN: 14.2 mg/dL (ref 7.0–26.0)
CALCIUM: 10 mg/dL (ref 8.4–10.4)
CO2: 26 meq/L (ref 22–29)
CREATININE: 0.8 mg/dL (ref 0.6–1.1)
Chloride: 109 mEq/L (ref 98–109)
EGFR: 76 mL/min/{1.73_m2} — AB (ref >90)
Glucose: 111 mg/dl (ref 70–140)
Potassium: 3.9 mEq/L (ref 3.5–5.1)
Sodium: 144 mEq/L (ref 136–145)
TOTAL PROTEIN: 7.2 g/dL (ref 6.4–8.3)

## 2016-02-13 MED ORDER — DEXAMETHASONE SODIUM PHOSPHATE 10 MG/ML IJ SOLN
INTRAMUSCULAR | Status: AC
  Filled 2016-02-13: qty 1

## 2016-02-13 MED ORDER — CYANOCOBALAMIN 1000 MCG/ML IJ SOLN
INTRAMUSCULAR | Status: AC
  Filled 2016-02-13: qty 1

## 2016-02-13 MED ORDER — ALTEPLASE 2 MG IJ SOLR
2.0000 mg | Freq: Once | INTRAMUSCULAR | Status: AC | PRN
  Administered 2016-02-13: 2 mg
  Filled 2016-02-13: qty 2

## 2016-02-13 MED ORDER — PEMETREXED DISODIUM CHEMO INJECTION 500 MG
500.0000 mg/m2 | Freq: Once | INTRAVENOUS | Status: AC
  Administered 2016-02-13: 900 mg via INTRAVENOUS
  Filled 2016-02-13: qty 20

## 2016-02-13 MED ORDER — SODIUM CHLORIDE 0.9 % IJ SOLN
10.0000 mL | INTRAMUSCULAR | Status: DC | PRN
  Administered 2016-02-13: 10 mL
  Filled 2016-02-13: qty 10

## 2016-02-13 MED ORDER — CYANOCOBALAMIN 1000 MCG/ML IJ SOLN
1000.0000 ug | Freq: Once | INTRAMUSCULAR | Status: AC
  Administered 2016-02-13: 1000 ug via INTRAMUSCULAR

## 2016-02-13 MED ORDER — DEXAMETHASONE SODIUM PHOSPHATE 10 MG/ML IJ SOLN
10.0000 mg | Freq: Once | INTRAMUSCULAR | Status: AC
  Administered 2016-02-13: 10 mg via INTRAVENOUS

## 2016-02-13 MED ORDER — MORPHINE SULFATE ER 60 MG PO TBCR: 60.0000 mg | EXTENDED_RELEASE_TABLET | Freq: Two times a day (BID) | ORAL | 0 refills | Status: DC

## 2016-02-13 MED ORDER — ONDANSETRON HCL 8 MG PO TABS
8.0000 mg | ORAL_TABLET | Freq: Once | ORAL | Status: AC
  Administered 2016-02-13: 8 mg via ORAL

## 2016-02-13 MED ORDER — ONDANSETRON HCL 8 MG PO TABS
ORAL_TABLET | ORAL | Status: AC
  Filled 2016-02-13: qty 1

## 2016-02-13 MED ORDER — MORPHINE SULFATE 30 MG PO TABS
30.0000 mg | ORAL_TABLET | ORAL | 0 refills | Status: DC | PRN
Start: 2016-02-13 — End: 2016-03-14

## 2016-02-13 MED ORDER — SODIUM CHLORIDE 0.9 % IV SOLN
Freq: Once | INTRAVENOUS | Status: AC
  Administered 2016-02-13: 12:00:00 via INTRAVENOUS

## 2016-02-13 MED ORDER — HEPARIN SOD (PORK) LOCK FLUSH 100 UNIT/ML IV SOLN
500.0000 [IU] | Freq: Once | INTRAVENOUS | Status: AC | PRN
  Administered 2016-02-13: 500 [IU]
  Filled 2016-02-13: qty 5

## 2016-02-13 NOTE — Patient Instructions (Signed)
[facility] Discharge Instructions for Patients Receiving Chemotherapy  Today you received the following chemotherapy agents Alimta.  To help prevent nausea and vomiting after your treatment, we encourage you to take your nausea medication as prescribed.   If you develop nausea and vomiting that is not controlled by your nausea medication, call the clinic.   BELOW ARE SYMPTOMS THAT SHOULD BE REPORTED IMMEDIATELY:  *FEVER GREATER THAN 100.5 F  *CHILLS WITH OR WITHOUT FEVER  NAUSEA AND VOMITING THAT IS NOT CONTROLLED WITH YOUR NAUSEA MEDICATION  *UNUSUAL SHORTNESS OF BREATH  *UNUSUAL BRUISING OR BLEEDING  TENDERNESS IN MOUTH AND THROAT WITH OR WITHOUT PRESENCE OF ULCERS  *URINARY PROBLEMS  *BOWEL PROBLEMS  UNUSUAL RASH Items with * indicate a potential emergency and should be followed up as soon as possible.  Feel free to call the clinic you have any questions or concerns. The clinic phone number is [phone].  Please show the CHEMO ALERT CARD at check-in to the Emergency Department and triage nurse.   

## 2016-02-13 NOTE — Progress Notes (Signed)
[place] [phone]   Fax:([phone]  OFFICE PROGRESS NOTE  [name], MD [place] [address]  DIAGNOSIS: Metastatic non-small cell lung cancer initially diagnosed as locally advanced stage IIIB with a right Pancoast tumor involving the vertebral body as well as prominent canal invasion with spinal cord compression in October 2007. The patient also has metastatic disease to the brain in April 2008.  PRIOR THERAPY: 1. Status post concurrent chemoradiation with weekly carboplatin and paclitaxel, last dose was given November 18, 2005. 2. 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 3. Status post gamma knife radiotherapy to a solitary brain lesion located in the superior frontal area of the brain at [facility] in April of 2008. 4. Status post palliative radiotherapy to the lateral abdominal wall metastatic lesion under the care of Dr. Lisbeth Renshaw, completed March of 2009. 5. Status post 6 cycles of systemic chemotherapy with carboplatin and Alimta. Last dose was given July 26, 2007 with disease stabilization. 6. Gamma knife stereotactic radiotherapy to 2 brain lesions one involving the right frontal dural based as well as right parietal lesion performed on 05/07/2012 under the care of Dr. Vallarie Mare at [facility].  CURRENT THERAPY: Maintenance systemic chemotherapy with Alimta 500 MG/M2 every 3 weeks, status post 146 cycles.  INTERVAL HISTORY: Danielle [name] 66 y.o. female came to the clinic today for follow-up visit. The patient is feeling fine today was no specific complaints except for mild fatigue. She is tolerating her current maintenance therapy with Alimta fairly well. She status post 146 cycles. She denied having any chest pain, shortness of breath, cough or hemoptysis. She has no fever or chills. She denied having any nausea, vomiting,  diarrhea or constipation. She is here today for evaluation before starting cycle #147.  MEDICAL HISTORY: Past Medical History:  Diagnosis Date  . Anemia   . 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 CVA (cerebral vascular accident) ([place]) 04/2014   pt states had 2 cva's within 2 wks  . DJD (degenerative joint disease), cervical   . Fibromyalgia   . H/O: pneumonia   . History of tobacco abuse quit 10/08   on nicotine patch  . Hypertension   . Hypokalemia   . Lung cancer ([place]) dx'd 09/2005   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 had chemo and radiation for lung ca  . Thrush 12/11/2010    ALLERGIES:  is allergic to contrast media [iodinated diagnostic agents]; iohexol; sulfa drugs cross reactors; and co-trimoxazole injection [sulfamethoxazole-trimethoprim].  MEDICATIONS:  Current Outpatient Prescriptions  Medication Sig Dispense Refill  . ALPRAZolam (XANAX) 0.25 MG tablet Take 1 tablet (0.25 mg total) by mouth at bedtime as needed. for sleep 30 tablet 0  . amLODipine (NORVASC) 10 MG tablet TAKE 1 TABLET (10 MG TOTAL) BY MOUTH DAILY. 90 tablet 1  . aspirin EC 325 MG tablet TAKE 1 TABLET (325 MG TOTAL) BY MOUTH DAILY. 90 tablet 0  . atorvastatin (LIPITOR) 40 MG tablet TAKE 1 TABLET BY MOUTH EVERY DAY AT 6 PM 90 tablet 0  . diphenhydrAMINE (BENADRYL) 25 MG tablet Take 1 tablet (25 mg total) by mouth once. 2 hours before CT scan on 09/18/2015 10 tablet 0  . FeFum-FePoly-FA-B Cmp-C-Biot (INTEGRA PLUS) CAPS Take 1 capsule by mouth every morning. 90 capsule 1  . fluticasone (FLONASE) 50 MCG/ACT nasal spray Place 1-2 sprays into both nostrils daily as needed.  16 g 5  . folic acid (FOLVITE) 1 MG tablet TAKE 1 TABLET BY MOUTH DAILY. 90 tablet 1  . lidocaine-prilocaine (EMLA) cream Apply to Port-A-Cath as directed 1-2 hours prior to chemotherapy 30 g 1  . lisinopril (PRINIVIL,ZESTRIL) 20 MG tablet TAKE 2 TABLETS (40 MG TOTAL) BY MOUTH DAILY. 60 tablet 2  . morphine (MS CONTIN) 60  MG 12 hr tablet Take 1 tablet (60 mg total) by mouth 2 (two) times daily. 60 tablet 0  . morphine (MSIR) 30 MG tablet Take 1 tablet (30 mg total) by mouth every 4 (four) hours as needed (breakthrough pain). 60 tablet 0  . Olopatadine HCl 0.2 % SOLN Apply 1 drop to eye daily. 2.5 mL 11  . ondansetron (ZOFRAN) 8 MG tablet TAKE 1 TABLET (8 MG TOTAL) BY MOUTH 2 (TWO) TIMES DAILY AS NEEDED FOR NAUSEA OR VOMITING. 30 tablet 2  . predniSONE (DELTASONE) 50 MG tablet One tablet by mouth 13 hour and 2 hours before the CT scan. 2 tablet 0   No current facility-administered medications for this visit.     SURGICAL HISTORY:  Past Surgical History:  Procedure Laterality Date  . CERVICAL LAMINECTOMY  1995  . KNEE SURGERY  1990   Left x 2  . 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 LOOP RECORDER IMPLANT N/A 04/19/2014   Procedure: LOOP RECORDER IMPLANT;  Surgeon: Thompson Grayer, MD;  [facility];  Service: Cardiovascular;  Laterality: N/A;  . OTHER SURGICAL HISTORY  2008   Gamma knife surgery to remove brain met   . PORTACATH PLACEMENT  -[name]   TIP IN CAVOATRIAL JUNCTION  . TEE WITHOUT CARDIOVERSION N/A 04/19/2014   Procedure: TRANSESOPHAGEAL ECHOCARDIOGRAM (TEE);  Surgeon: Larey Dresser, MD;  Location: [facility];  Service: Cardiovascular;  Laterality: N/A;    REVIEW OF SYSTEMS:  A comprehensive review of systems was negative except for: Constitutional: positive for fatigue   PHYSICAL EXAMINATION: General appearance: alert, cooperative, fatigued and no distress Head: Normocephalic, without obvious abnormality, atraumatic Neck: no adenopathy, no JVD, supple, symmetrical, trachea midline and thyroid not enlarged, symmetric, no tenderness/mass/nodules Lymph nodes: Cervical, supraclavicular, and axillary nodes normal. Resp: clear to auscultation bilaterally Back: symmetric, no curvature. ROM normal. No CVA tenderness. Cardio: regular rate and rhythm, S1, S2  normal, no murmur, click, rub or gallop GI: soft, non-tender; bowel sounds normal; no masses,  no organomegaly Extremities: extremities normal, atraumatic, no cyanosis or edema  ECOG PERFORMANCE STATUS: 1 - Symptomatic but completely ambulatory  Blood pressure (!) 157/74, pulse 90, temperature 97.8 F (36.6 C), temperature source Oral, resp. rate 18, height '5\' 3"'  (1.6 m), weight 137 lb 11.2 oz (62.5 kg), SpO2 100 %.  LABORATORY DATA: Lab Results  Component Value Date   WBC 10.6 (H) 02/13/2016   HGB 13.3 02/13/2016   HCT 39.9 02/13/2016   MCV 95.3 02/13/2016   PLT 330 02/13/2016      Chemistry      Component Value Date/Time   NA 141 01/23/2016 0910   K 4.5 01/23/2016 0910   CL 103 01/26/2015 0818   CL 106 06/30/2012 1004   CO2 26 01/23/2016 0910   BUN 13.3 01/23/2016 0910   CREATININE 0.8 01/23/2016 0910      Component Value Date/Time   CALCIUM 10.0 01/23/2016 0910   ALKPHOS 90 01/23/2016 0910   AST 17 01/23/2016 0910   ALT 11 01/23/2016 0910   BILITOT 0.41 01/23/2016  7014       RADIOGRAPHIC STUDIES: Ct Chest W Contrast  Result Date: 01/19/2016 CLINICAL DATA:  Restaging metastatic lung cancer. EXAM: CT CHEST, ABDOMEN, AND PELVIS WITH CONTRAST TECHNIQUE: Multidetector CT imaging of the chest, abdomen and pelvis was performed following the standard protocol during bolus administration of intravenous contrast. CONTRAST:  40m ISOVUE-300 IOPAMIDOL (ISOVUE-300) INJECTION 61% COMPARISON:  Multiple prior CT scans.  The most recent is 09/15/2015 FINDINGS: CT CHEST FINDINGS Chest wall: Left chest wall implant possibly a loop recorder or pacer device. The right Port-A-Cath is stable. No breast masses, supraclavicular or axillary lymphadenopathy. The thyroid gland is grossly normal in stable. Cardiovascular: The heart is normal in size. No pericardial effusion. Stable tortuosity and calcification of the thoracic aorta. No dissection. Stable coronary artery calcifications.  Mediastinum/Nodes: No mediastinal or hilar mass or lymphadenopathy. Small scattered lymph nodes are stable. The esophagus is grossly normal. Lungs/Pleura: Stable biapical pleural and parenchymal scarring changes. Measuring approximately 24 x 13 mm. Stable triangular subpleural density in the right upper lobe anteriorly on image number 43 measuring 8 x 7.5 mm. No acute overlying pulmonary process. No new pulmonary nodules. No pleural effusion. Stable lower lobe peribronchial thickening. Musculoskeletal: Stable T7 in T4 vertebral augmentation changes. Stable T3 vertebral plana. No new fractures or bone lesions. CT ABDOMEN PELVIS FINDINGS Hepatobiliary: No focal hepatic lesions or intrahepatic biliary dilatation. The gallbladder is normal. Mild stable common bile duct dilatation. Pancreas: Pancreatic atrophy but no mass or ductal dilatation. Spleen: Normal size.  No focal lesions. Adrenals/Urinary Tract: The adrenal glands and kidneys are unremarkable and stable. No ureteral or bladder calculi. Stomach/Bowel: The stomach, duodenum, small bowel and colon are grossly normal without oral contrast. No inflammatory changes, mass lesions or obstructive findings. The terminal ileum and appendix are normal. Vascular/Lymphatic: Stable advanced atherosclerotic calcifications involving the aorta and branch vessels. No aneurysm or dissection. No mesenteric or retroperitoneal mass or adenopathy. Reproductive: The uterus and ovaries are normal. Other: No omental or peritoneal surface disease. No ascites or abdominal wall hernia. No inguinal mass or adenopathy. Musculoskeletal: No significant bony findings. IMPRESSION: 1. Stable advanced emphysematous changes involving the chest along with extensive pulmonary scarring. No CT findings suspicious for recurrent tumor or metastatic disease. 2. No mediastinal or hilar mass or adenopathy. 3. No findings for metastatic disease involving the abdomen/pelvis. Electronically Signed   By: PMarijo SanesM.D.   On: 01/19/2016 15:21   Ct Abdomen Pelvis W Contrast  Result Date: 01/19/2016 CLINICAL DATA:  Restaging metastatic lung cancer. EXAM: CT CHEST, ABDOMEN, AND PELVIS WITH CONTRAST TECHNIQUE: Multidetector CT imaging of the chest, abdomen and pelvis was performed following the standard protocol during bolus administration of intravenous contrast. CONTRAST:  768mISOVUE-300 IOPAMIDOL (ISOVUE-300) INJECTION 61% COMPARISON:  Multiple prior CT scans.  The most recent is 09/15/2015 FINDINGS: CT CHEST FINDINGS Chest wall: Left chest wall implant possibly a loop recorder or pacer device. The right Port-A-Cath is stable. No breast masses, supraclavicular or axillary lymphadenopathy. The thyroid gland is grossly normal in stable. Cardiovascular: The heart is normal in size. No pericardial effusion. Stable tortuosity and calcification of the thoracic aorta. No dissection. Stable coronary artery calcifications. Mediastinum/Nodes: No mediastinal or hilar mass or lymphadenopathy. Small scattered lymph nodes are stable. The esophagus is grossly normal. Lungs/Pleura: Stable biapical pleural and parenchymal scarring changes. Measuring approximately 24 x 13 mm. Stable triangular subpleural density in the right upper lobe anteriorly on image number 43 measuring 8 x 7.5 mm. No acute overlying pulmonary  process. No new pulmonary nodules. No pleural effusion. Stable lower lobe peribronchial thickening. Musculoskeletal: Stable T7 in T4 vertebral augmentation changes. Stable T3 vertebral plana. No new fractures or bone lesions. CT ABDOMEN PELVIS FINDINGS Hepatobiliary: No focal hepatic lesions or intrahepatic biliary dilatation. The gallbladder is normal. Mild stable common bile duct dilatation. Pancreas: Pancreatic atrophy but no mass or ductal dilatation. Spleen: Normal size.  No focal lesions. Adrenals/Urinary Tract: The adrenal glands and kidneys are unremarkable and stable. No ureteral or bladder calculi.  Stomach/Bowel: The stomach, duodenum, small bowel and colon are grossly normal without oral contrast. No inflammatory changes, mass lesions or obstructive findings. The terminal ileum and appendix are normal. Vascular/Lymphatic: Stable advanced atherosclerotic calcifications involving the aorta and branch vessels. No aneurysm or dissection. No mesenteric or retroperitoneal mass or adenopathy. Reproductive: The uterus and ovaries are normal. Other: No omental or peritoneal surface disease. No ascites or abdominal wall hernia. No inguinal mass or adenopathy. Musculoskeletal: No significant bony findings. IMPRESSION: 1. Stable advanced emphysematous changes involving the chest along with extensive pulmonary scarring. No CT findings suspicious for recurrent tumor or metastatic disease. 2. No mediastinal or hilar mass or adenopathy. 3. No findings for metastatic disease involving the abdomen/pelvis. Electronically Signed   By: Marijo Sanes M.D.   On: 01/19/2016 15:21    ASSESSMENT AND PLAN: This is a very pleasant 66 years old white female with metastatic non-small cell lung cancer diagnosed in October 2007 status post initial course of concurrent chemoradiation followed by 1 cycle of consolidation chemotherapy in addition to stereotactic radiotherapy to metastatic brain lesion. She had evidence for disease progression after this treatment and she was started on induction systemic chemotherapy with carboplatin and Alimta for 6 cycles. The patient is currently on maintenance systemic chemotherapy with single agent Alimta status post 146 cycles. She is tolerating her treatment fairly well was no significant adverse effects except for fatigue. Her CBC today is unremarkable. I recommended for the patient to proceed with cycle #147 today as scheduled. For the hypertension, I recommended for the patient to monitor her blood pressure closely at home. She will continue with her current treatment with lisinopril. For  the chemotherapy-induced anemia and iron deficiency, she will continue on Integra +1 capsule by mouth daily. For pain management, I gave the patient a refill for MS Contin and morphine sulfate for breakthrough pain. The patient would come back for follow-up visit in 3 weeks for evaluation before the next cycle of her treatment. She was advised to call immediately if she has any concerning symptoms in the interval. The patient voices understanding of current disease status and treatment options and is in agreement with the current care plan.  All questions were answered. The patient knows to call the clinic with any problems, questions or concerns. We can certainly see the patient much sooner if necessary.  Disclaimer: This note was dictated with voice recognition software. Similar sounding words can inadvertently be transcribed and may not be corrected upon review.

## 2016-02-19 LAB — CUP PACEART REMOTE DEVICE CHECK
Implantable Pulse Generator Implant Date: 20160412
MDC IDC SESS DTM: 20180103013825

## 2016-02-26 ENCOUNTER — Other Ambulatory Visit: Payer: Self-pay | Admitting: *Deleted

## 2016-02-26 DIAGNOSIS — F411 Generalized anxiety disorder: Secondary | ICD-10-CM

## 2016-02-26 MED ORDER — ALPRAZOLAM 0.25 MG PO TABS: 0.2500 mg | ORAL_TABLET | Freq: Every evening | ORAL | 0 refills | Status: DC | PRN

## 2016-02-26 NOTE — Telephone Encounter (Signed)
Pt called with request for xanax refill. Reviewed with MD Rx refilled, phoned into pharmacy.

## 2016-02-29 ENCOUNTER — Other Ambulatory Visit: Payer: Self-pay | Admitting: *Deleted

## 2016-02-29 DIAGNOSIS — C7931 Secondary malignant neoplasm of brain: Secondary | ICD-10-CM

## 2016-02-29 MED ORDER — ONDANSETRON HCL 8 MG PO TABS: ORAL_TABLET | ORAL | 2 refills | Status: DC

## 2016-03-05 ENCOUNTER — Ambulatory Visit (HOSPITAL_BASED_OUTPATIENT_CLINIC_OR_DEPARTMENT_OTHER): Payer: Medicare Other | Admitting: Internal Medicine

## 2016-03-05 ENCOUNTER — Ambulatory Visit (HOSPITAL_BASED_OUTPATIENT_CLINIC_OR_DEPARTMENT_OTHER): Payer: Medicare Other

## 2016-03-05 ENCOUNTER — Encounter: Payer: Self-pay | Admitting: Internal Medicine

## 2016-03-05 ENCOUNTER — Other Ambulatory Visit (HOSPITAL_BASED_OUTPATIENT_CLINIC_OR_DEPARTMENT_OTHER): Payer: Medicare Other

## 2016-03-05 VITALS — BP 141/71 | HR 91 | Temp 97.8°F | Resp 17 | Ht 63.0 in | Wt 138.5 lb

## 2016-03-05 DIAGNOSIS — C7931 Secondary malignant neoplasm of brain: Secondary | ICD-10-CM

## 2016-03-05 DIAGNOSIS — C3491 Malignant neoplasm of unspecified part of right bronchus or lung: Secondary | ICD-10-CM

## 2016-03-05 DIAGNOSIS — C3411 Malignant neoplasm of upper lobe, right bronchus or lung: Secondary | ICD-10-CM

## 2016-03-05 DIAGNOSIS — Z5111 Encounter for antineoplastic chemotherapy: Secondary | ICD-10-CM | POA: Diagnosis not present

## 2016-03-05 LAB — COMPREHENSIVE METABOLIC PANEL
ALBUMIN: 3.6 g/dL (ref 3.5–5.0)
ALK PHOS: 89 U/L (ref 40–150)
ALT: 14 U/L (ref 0–55)
ANION GAP: 9 meq/L (ref 3–11)
AST: 21 U/L (ref 5–34)
BUN: 11.1 mg/dL (ref 7.0–26.0)
CALCIUM: 10.1 mg/dL (ref 8.4–10.4)
CHLORIDE: 107 meq/L (ref 98–109)
CO2: 27 mEq/L (ref 22–29)
Creatinine: 0.9 mg/dL (ref 0.6–1.1)
EGFR: 69 mL/min/{1.73_m2} — ABNORMAL LOW (ref >90)
Glucose: 126 mg/dl (ref 70–140)
POTASSIUM: 4.2 meq/L (ref 3.5–5.1)
Sodium: 143 mEq/L (ref 136–145)
Total Bilirubin: 0.37 mg/dL (ref 0.20–1.20)
Total Protein: 7 g/dL (ref 6.4–8.3)

## 2016-03-05 LAB — CBC WITH DIFFERENTIAL/PLATELET
BASO%: 0.6 % (ref 0.0–2.0)
BASOS ABS: 0.1 10*3/uL (ref 0.0–0.1)
EOS ABS: 0.1 10*3/uL (ref 0.0–0.5)
EOS%: 1.1 % (ref 0.0–7.0)
HEMATOCRIT: 38 % (ref 34.8–46.6)
HGB: 12.7 g/dL (ref 11.6–15.9)
LYMPH#: 1.9 10*3/uL (ref 0.9–3.3)
LYMPH%: 21 % (ref 14.0–49.7)
MCH: 31.9 pg (ref 25.1–34.0)
MCHC: 33.4 g/dL (ref 31.5–36.0)
MCV: 95.7 fL (ref 79.5–101.0)
MONO#: 1.2 10*3/uL — AB (ref 0.1–0.9)
MONO%: 12.8 % (ref 0.0–14.0)
NEUT#: 6 10*3/uL (ref 1.5–6.5)
NEUT%: 64.5 % (ref 38.4–76.8)
PLATELETS: 335 10*3/uL (ref 145–400)
RBC: 3.97 10*6/uL (ref 3.70–5.45)
RDW: 15.1 % — ABNORMAL HIGH (ref 11.2–14.5)
WBC: 9.3 10*3/uL (ref 3.9–10.3)

## 2016-03-05 MED ORDER — SODIUM CHLORIDE 0.9 % IV SOLN
500.0000 mg/m2 | Freq: Once | INTRAVENOUS | Status: AC
  Administered 2016-03-05: 900 mg via INTRAVENOUS
  Filled 2016-03-05: qty 20

## 2016-03-05 MED ORDER — ONDANSETRON HCL 8 MG PO TABS
ORAL_TABLET | ORAL | Status: AC
  Filled 2016-03-05: qty 1

## 2016-03-05 MED ORDER — DEXAMETHASONE SODIUM PHOSPHATE 10 MG/ML IJ SOLN
INTRAMUSCULAR | Status: AC
  Filled 2016-03-05: qty 1

## 2016-03-05 MED ORDER — SODIUM CHLORIDE 0.9 % IV SOLN
Freq: Once | INTRAVENOUS | Status: AC
  Administered 2016-03-05: 10:00:00 via INTRAVENOUS

## 2016-03-05 MED ORDER — HEPARIN SOD (PORK) LOCK FLUSH 100 UNIT/ML IV SOLN
500.0000 [IU] | Freq: Once | INTRAVENOUS | Status: AC | PRN
  Administered 2016-03-05: 500 [IU]
  Filled 2016-03-05: qty 5

## 2016-03-05 MED ORDER — ONDANSETRON HCL 8 MG PO TABS
8.0000 mg | ORAL_TABLET | Freq: Once | ORAL | Status: AC
  Administered 2016-03-05: 8 mg via ORAL

## 2016-03-05 MED ORDER — SODIUM CHLORIDE 0.9 % IJ SOLN
10.0000 mL | INTRAMUSCULAR | Status: DC | PRN
  Administered 2016-03-05: 10 mL
  Filled 2016-03-05: qty 10

## 2016-03-05 MED ORDER — DEXAMETHASONE SODIUM PHOSPHATE 10 MG/ML IJ SOLN
10.0000 mg | Freq: Once | INTRAMUSCULAR | Status: AC
  Administered 2016-03-05: 10 mg via INTRAVENOUS

## 2016-03-05 NOTE — Progress Notes (Signed)
[place] Telephone:(336) [phone]   Fax:(336) [phone]  OFFICE PROGRESS NOTE  Danielle Lofts, MD [place] [address]  DIAGNOSIS: Metastatic non-small cell lung cancer initially diagnosed as locally advanced stage IIIB with a right Pancoast tumor involving the vertebral body as well as prominent canal invasion with spinal cord compression in October 2007. The patient also has metastatic disease to the brain in April 2008.  PRIOR THERAPY: 1. Status post concurrent chemoradiation with weekly carboplatin and paclitaxel, last dose was given November 18, 2005. 2. 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 3. Status post gamma knife radiotherapy to a solitary brain lesion located in the superior frontal area of the brain at [facility] in April of 2008. 4. Status post palliative radiotherapy to the lateral abdominal wall metastatic lesion under the care of Dr. Lisbeth Renshaw, completed March of 2009. 5. Status post 6 cycles of systemic chemotherapy with carboplatin and Alimta. Last dose was given July 26, 2007 with disease stabilization. 6. Gamma knife stereotactic radiotherapy to 2 brain lesions one involving the right frontal dural based as well as right parietal lesion performed on 05/07/2012 under the care of Dr. Vallarie Mare at [facility].  CURRENT THERAPY: Maintenance systemic chemotherapy with Alimta 500 MG/M2 every 3 weeks, status post 147 cycles.  INTERVAL HISTORY: Danielle [name] 66 y.o. female came to the clinic today for follow-up visit. The patient is feeling fine with no specific complaints. She denied having any weight loss or night sweats. She has no chest pain, shortness of breath, cough or hemoptysis. She denied having any fever or chills. She is tolerating her current maintenance treatment with single agent Alimta fairly well. She is here for evaluation before  starting cycle #148.   MEDICAL HISTORY: Past Medical History:  Diagnosis Date  . Anemia   . 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 CVA (cerebral vascular accident) ([place]) 04/2014   pt states had 2 cva's within 2 wks  . DJD (degenerative joint disease), cervical   . Fibromyalgia   . H/O: pneumonia   . History of tobacco abuse quit 10/08   on nicotine patch  . Hypertension   . Hypokalemia   . Lung cancer ([place]) dx'd 09/2005   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 had chemo and radiation for lung ca  . Thrush 12/11/2010    ALLERGIES:  is allergic to contrast media [iodinated diagnostic agents]; iohexol; sulfa drugs cross reactors; and co-trimoxazole injection [sulfamethoxazole-trimethoprim].  MEDICATIONS:  Current Outpatient Prescriptions  Medication Sig Dispense Refill  . ALPRAZolam (XANAX) 0.25 MG tablet Take 1 tablet (0.25 mg total) by mouth at bedtime as needed. for sleep 30 tablet 0  . amLODipine (NORVASC) 10 MG tablet TAKE 1 TABLET (10 MG TOTAL) BY MOUTH DAILY. 90 tablet 1  . aspirin EC 325 MG tablet TAKE 1 TABLET (325 MG TOTAL) BY MOUTH DAILY. 90 tablet 0  . atorvastatin (LIPITOR) 40 MG tablet TAKE 1 TABLET BY MOUTH EVERY DAY AT 6 PM 90 tablet 0  . FeFum-FePoly-FA-B Cmp-C-Biot (INTEGRA PLUS) CAPS Take 1 capsule by mouth every morning. 90 capsule 1  . fluticasone (FLONASE) 50 MCG/ACT nasal spray Place 1-2 sprays into both nostrils daily as needed. 16 g 5  . folic acid (FOLVITE) 1 MG tablet TAKE 1 TABLET BY MOUTH DAILY. 90 tablet 1  . lidocaine-prilocaine (EMLA) cream Apply to Port-A-Cath as directed 1-2 hours prior to  chemotherapy 30 g 1  . lisinopril (PRINIVIL,ZESTRIL) 20 MG tablet TAKE 2 TABLETS (40 MG TOTAL) BY MOUTH DAILY. 60 tablet 2  . morphine (MS CONTIN) 60 MG 12 hr tablet Take 1 tablet (60 mg total) by mouth 2 (two) times daily. 60 tablet 0  . morphine (MSIR) 30 MG tablet Take 1 tablet (30 mg total) by mouth every 4 (four) hours as needed  (breakthrough pain). 60 tablet 0  . Olopatadine HCl 0.2 % SOLN Apply 1 drop to eye daily. 2.5 mL 11  . ondansetron (ZOFRAN) 8 MG tablet TAKE 1 TABLET (8 MG TOTAL) BY MOUTH 2 (TWO) TIMES DAILY AS NEEDED FOR NAUSEA OR VOMITING. 30 tablet 2  . diphenhydrAMINE (BENADRYL) 25 MG tablet Take 1 tablet (25 mg total) by mouth once. 2 hours before CT scan on 09/18/2015 10 tablet 0  . predniSONE (DELTASONE) 50 MG tablet One tablet by mouth 13 hour and 2 hours before the CT scan. (Patient not taking: Reported on 03/05/2016) 2 tablet 0   No current facility-administered medications for this visit.     SURGICAL HISTORY:  Past Surgical History:  Procedure Laterality Date  . CERVICAL LAMINECTOMY  1995  . KNEE SURGERY  1990   Left x 2  . 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 LOOP RECORDER IMPLANT N/A 04/19/2014   Procedure: LOOP RECORDER IMPLANT;  Surgeon: Thompson Grayer, MD;  Location: [facility] CATH LAB;  Service: Cardiovascular;  Laterality: N/A;  . OTHER SURGICAL HISTORY  2008   Gamma knife surgery to remove brain met   . PORTACATH PLACEMENT  -[name]   TIP IN CAVOATRIAL JUNCTION  . TEE WITHOUT CARDIOVERSION N/A 04/19/2014   Procedure: TRANSESOPHAGEAL ECHOCARDIOGRAM (TEE);  Surgeon: Larey Dresser, MD;  Location: [facility] ENDOSCOPY;  Service: Cardiovascular;  Laterality: N/A;    REVIEW OF SYSTEMS:  A comprehensive review of systems was negative.   PHYSICAL EXAMINATION: General appearance: alert, cooperative and no distress Head: Normocephalic, without obvious abnormality, atraumatic Neck: no adenopathy, no JVD, supple, symmetrical, trachea midline and thyroid not enlarged, symmetric, no tenderness/mass/nodules Lymph nodes: Cervical, supraclavicular, and axillary nodes normal. Resp: clear to auscultation bilaterally Back: symmetric, no curvature. ROM normal. No CVA tenderness. Cardio: regular rate and rhythm, S1, S2 normal, no murmur, click, rub or gallop GI: soft, non-tender;  bowel sounds normal; no masses,  no organomegaly Extremities: extremities normal, atraumatic, no cyanosis or edema  ECOG PERFORMANCE STATUS: 1 - Symptomatic but completely ambulatory  Blood pressure (!) 141/71, pulse 91, temperature 97.8 F (36.6 C), temperature source Oral, resp. rate 17, height '5\' 3"'  (1.6 m), weight 138 lb 8 oz (62.8 kg), SpO2 98 %.  LABORATORY DATA: Lab Results  Component Value Date   WBC 9.3 03/05/2016   HGB 12.7 03/05/2016   HCT 38.0 03/05/2016   MCV 95.7 03/05/2016   PLT 335 03/05/2016      Chemistry      Component Value Date/Time   NA 144 02/13/2016 0843   K 3.9 02/13/2016 0843   CL 103 01/26/2015 0818   CL 106 06/30/2012 1004   CO2 26 02/13/2016 0843   BUN 14.2 02/13/2016 0843   CREATININE 0.8 02/13/2016 0843      Component Value Date/Time   CALCIUM 10.0 02/13/2016 0843   ALKPHOS 90 02/13/2016 0843   AST 22 02/13/2016 0843   ALT 12 02/13/2016 0843   BILITOT 0.50 02/13/2016 0843       RADIOGRAPHIC STUDIES: No  results found.  ASSESSMENT AND PLAN: This is a very pleasant 66 years old white female with metastatic non-small cell lung cancer diagnosed in October 2007, status post concurrent chemoradiation followed by consolidation chemotherapy for 1 cycle was docetaxel followed by stereotactic radiotherapy to metastatic brain lesions. She was then treated with induction chemotherapy with carboplatin and Alimta for 6 cycles. She has been on treatment with single agent Alimta status post 147 cycles. The patient has been tolerating her treatment well with no significant adverse effects. I recommended for her to proceed with cycle 148 today She would come back for follow-up visit in 3 weeks for evaluation with the start of the next cycle of her treatment. She was advised to call immediately if she has any concerning symptoms in the interval. The patient voices understanding of current disease status and treatment options and is in agreement with the  current care plan.  All questions were answered. The patient knows to call the clinic with any problems, questions or concerns. We can certainly see the patient much sooner if necessary. I spent 10 minutes counseling the patient face to face. The total time spent in the appointment was 15 minutes.  Disclaimer: This note was dictated with voice recognition software. Similar sounding words can inadvertently be transcribed and may not be corrected upon review.

## 2016-03-05 NOTE — Patient Instructions (Signed)
[facility] Discharge Instructions for Patients Receiving Chemotherapy  Today you received the following chemotherapy agents Alimta.  To help prevent nausea and vomiting after your treatment, we encourage you to take your nausea medication as prescribed.   If you develop nausea and vomiting that is not controlled by your nausea medication, call the clinic.   BELOW ARE SYMPTOMS THAT SHOULD BE REPORTED IMMEDIATELY:  *FEVER GREATER THAN 100.5 F  *CHILLS WITH OR WITHOUT FEVER  NAUSEA AND VOMITING THAT IS NOT CONTROLLED WITH YOUR NAUSEA MEDICATION  *UNUSUAL SHORTNESS OF BREATH  *UNUSUAL BRUISING OR BLEEDING  TENDERNESS IN MOUTH AND THROAT WITH OR WITHOUT PRESENCE OF ULCERS  *URINARY PROBLEMS  *BOWEL PROBLEMS  UNUSUAL RASH Items with * indicate a potential emergency and should be followed up as soon as possible.  Feel free to call the clinic you have any questions or concerns. The clinic phone number is [phone].  Please show the CHEMO ALERT CARD at check-in to the Emergency Department and triage nurse.   

## 2016-03-06 ENCOUNTER — Other Ambulatory Visit: Payer: Self-pay | Admitting: Family Medicine

## 2016-03-08 LAB — CUP PACEART REMOTE DEVICE CHECK
Date Time Interrogation Session: 20180202023851
Implantable Pulse Generator Implant Date: 20160412

## 2016-03-11 ENCOUNTER — Ambulatory Visit (INDEPENDENT_AMBULATORY_CARE_PROVIDER_SITE_OTHER): Payer: Medicare Other | Admitting: *Deleted

## 2016-03-11 DIAGNOSIS — I639 Cerebral infarction, unspecified: Secondary | ICD-10-CM | POA: Diagnosis not present

## 2016-03-11 NOTE — Progress Notes (Signed)
[facility] Summary Report / Loop Recorder 

## 2016-03-14 ENCOUNTER — Other Ambulatory Visit: Payer: Self-pay | Admitting: Medical Oncology

## 2016-03-14 DIAGNOSIS — C3490 Malignant neoplasm of unspecified part of unspecified bronchus or lung: Secondary | ICD-10-CM

## 2016-03-14 DIAGNOSIS — C3410 Malignant neoplasm of upper lobe, unspecified bronchus or lung: Secondary | ICD-10-CM

## 2016-03-14 MED ORDER — MORPHINE SULFATE 30 MG PO TABS: 30.0000 mg | ORAL_TABLET | ORAL | 0 refills | Status: DC | PRN

## 2016-03-14 MED ORDER — MORPHINE SULFATE ER 60 MG PO TBCR: 60.0000 mg | EXTENDED_RELEASE_TABLET | Freq: Two times a day (BID) | ORAL | 0 refills | Status: DC

## 2016-03-19 ENCOUNTER — Telehealth: Payer: Self-pay

## 2016-03-19 DIAGNOSIS — C3411 Malignant neoplasm of upper lobe, right bronchus or lung: Secondary | ICD-10-CM

## 2016-03-19 DIAGNOSIS — C7931 Secondary malignant neoplasm of brain: Secondary | ICD-10-CM

## 2016-03-19 MED ORDER — ASPIRIN EC 325 MG PO TBEC: 325.0000 mg | DELAYED_RELEASE_TABLET | Freq: Every day | ORAL | 0 refills | Status: DC

## 2016-03-19 NOTE — Telephone Encounter (Signed)
Incoming fax for [facility] ASA refill

## 2016-03-23 LAB — CUP PACEART REMOTE DEVICE CHECK
Date Time Interrogation Session: 20180304050928
MDC IDC PG IMPLANT DT: 20160412

## 2016-03-23 NOTE — Progress Notes (Signed)
[facility] summary report received. Battery status OK. Normal device function. No new symptom episodes, tachy episodes, brady, or pause episodes. No new AF episodes. Monthly summary reports and ROV/PRN 

## 2016-03-26 ENCOUNTER — Telehealth: Payer: Self-pay | Admitting: Internal Medicine

## 2016-03-26 ENCOUNTER — Ambulatory Visit (HOSPITAL_BASED_OUTPATIENT_CLINIC_OR_DEPARTMENT_OTHER): Payer: Medicare Other | Admitting: Internal Medicine

## 2016-03-26 ENCOUNTER — Other Ambulatory Visit (HOSPITAL_BASED_OUTPATIENT_CLINIC_OR_DEPARTMENT_OTHER): Payer: Medicare Other

## 2016-03-26 ENCOUNTER — Encounter: Payer: Self-pay | Admitting: Internal Medicine

## 2016-03-26 ENCOUNTER — Ambulatory Visit (HOSPITAL_BASED_OUTPATIENT_CLINIC_OR_DEPARTMENT_OTHER): Payer: Medicare Other

## 2016-03-26 VITALS — HR 95

## 2016-03-26 DIAGNOSIS — C3491 Malignant neoplasm of unspecified part of right bronchus or lung: Secondary | ICD-10-CM

## 2016-03-26 DIAGNOSIS — C3411 Malignant neoplasm of upper lobe, right bronchus or lung: Secondary | ICD-10-CM

## 2016-03-26 DIAGNOSIS — Z5111 Encounter for antineoplastic chemotherapy: Secondary | ICD-10-CM | POA: Diagnosis not present

## 2016-03-26 DIAGNOSIS — F411 Generalized anxiety disorder: Secondary | ICD-10-CM

## 2016-03-26 DIAGNOSIS — C7931 Secondary malignant neoplasm of brain: Secondary | ICD-10-CM | POA: Diagnosis not present

## 2016-03-26 LAB — CBC WITH DIFFERENTIAL/PLATELET
BASO%: 0.4 % (ref 0.0–2.0)
Basophils Absolute: 0 10*3/uL (ref 0.0–0.1)
EOS%: 1.3 % (ref 0.0–7.0)
Eosinophils Absolute: 0.1 10*3/uL (ref 0.0–0.5)
HEMATOCRIT: 38.1 % (ref 34.8–46.6)
HEMOGLOBIN: 13 g/dL (ref 11.6–15.9)
LYMPH#: 2.3 10*3/uL (ref 0.9–3.3)
LYMPH%: 24.1 % (ref 14.0–49.7)
MCH: 32.3 pg (ref 25.1–34.0)
MCHC: 34.1 g/dL (ref 31.5–36.0)
MCV: 94.9 fL (ref 79.5–101.0)
MONO#: 1.1 10*3/uL — ABNORMAL HIGH (ref 0.1–0.9)
MONO%: 11.5 % (ref 0.0–14.0)
NEUT#: 5.9 10*3/uL (ref 1.5–6.5)
NEUT%: 62.7 % (ref 38.4–76.8)
Platelets: 364 10*3/uL (ref 145–400)
RBC: 4.02 10*6/uL (ref 3.70–5.45)
RDW: 14.5 % (ref 11.2–14.5)
WBC: 9.4 10*3/uL (ref 3.9–10.3)

## 2016-03-26 LAB — COMPREHENSIVE METABOLIC PANEL
ALBUMIN: 3.5 g/dL (ref 3.5–5.0)
ALT: 15 U/L (ref 0–55)
AST: 21 U/L (ref 5–34)
Alkaline Phosphatase: 90 U/L (ref 40–150)
Anion Gap: 10 mEq/L (ref 3–11)
BUN: 11.8 mg/dL (ref 7.0–26.0)
CALCIUM: 9.9 mg/dL (ref 8.4–10.4)
CHLORIDE: 107 meq/L (ref 98–109)
CO2: 25 mEq/L (ref 22–29)
CREATININE: 0.9 mg/dL (ref 0.6–1.1)
EGFR: 71 mL/min/{1.73_m2} — ABNORMAL LOW (ref >90)
Glucose: 145 mg/dl — ABNORMAL HIGH (ref 70–140)
Potassium: 3.8 mEq/L (ref 3.5–5.1)
Sodium: 142 mEq/L (ref 136–145)
Total Bilirubin: 0.35 mg/dL (ref 0.20–1.20)
Total Protein: 7.2 g/dL (ref 6.4–8.3)

## 2016-03-26 MED ORDER — DEXAMETHASONE SODIUM PHOSPHATE 10 MG/ML IJ SOLN
10.0000 mg | Freq: Once | INTRAMUSCULAR | Status: AC
  Administered 2016-03-26: 10 mg via INTRAVENOUS

## 2016-03-26 MED ORDER — SODIUM CHLORIDE 0.9 % IV SOLN
Freq: Once | INTRAVENOUS | Status: AC
  Administered 2016-03-26: 10:00:00 via INTRAVENOUS

## 2016-03-26 MED ORDER — ALPRAZOLAM 0.25 MG PO TABS: 0.2500 mg | ORAL_TABLET | Freq: Every evening | ORAL | 0 refills | Status: DC | PRN

## 2016-03-26 MED ORDER — ONDANSETRON HCL 8 MG PO TABS
8.0000 mg | ORAL_TABLET | Freq: Once | ORAL | Status: AC
  Administered 2016-03-26: 8 mg via ORAL

## 2016-03-26 MED ORDER — SODIUM CHLORIDE 0.9 % IJ SOLN
10.0000 mL | INTRAMUSCULAR | Status: DC | PRN
  Administered 2016-03-26: 10 mL
  Filled 2016-03-26: qty 10

## 2016-03-26 MED ORDER — HEPARIN SOD (PORK) LOCK FLUSH 100 UNIT/ML IV SOLN
500.0000 [IU] | Freq: Once | INTRAVENOUS | Status: AC | PRN
  Administered 2016-03-26: 500 [IU]
  Filled 2016-03-26: qty 5

## 2016-03-26 MED ORDER — ONDANSETRON HCL 8 MG PO TABS
ORAL_TABLET | ORAL | Status: AC
  Filled 2016-03-26: qty 1

## 2016-03-26 MED ORDER — SODIUM CHLORIDE 0.9 % IV SOLN
500.0000 mg/m2 | Freq: Once | INTRAVENOUS | Status: AC
  Administered 2016-03-26: 900 mg via INTRAVENOUS
  Filled 2016-03-26: qty 20

## 2016-03-26 MED ORDER — DEXAMETHASONE SODIUM PHOSPHATE 10 MG/ML IJ SOLN
INTRAMUSCULAR | Status: AC
  Filled 2016-03-26: qty 1

## 2016-03-26 NOTE — Progress Notes (Signed)
[place] Telephone:([phone]   [phone]  OFFICE PROGRESS NOTE  [name], MD [address] [place] [address]  DIAGNOSIS: Metastatic non-small cell lung cancer initially diagnosed as locally advanced stage IIIB with a right Pancoast tumor involving the vertebral body as well as prominent canal invasion with spinal cord compression in October 2007. The patient also has metastatic disease to the brain in April 2008.  PRIOR THERAPY: 1. Status post concurrent chemoradiation with weekly carboplatin and paclitaxel, last dose was given November 18, 2005. 2. 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 3. Status post gamma knife radiotherapy to a solitary brain lesion located in the superior frontal area of the brain at [facility] in April of 2008. 4. Status post palliative radiotherapy to the lateral abdominal wall metastatic lesion under the care of Dr. Lisbeth Renshaw, completed March of 2009. 5. Status post 6 cycles of systemic chemotherapy with carboplatin and Alimta. Last dose was given July 26, 2007 with disease stabilization. 6. Gamma knife stereotactic radiotherapy to 2 brain lesions one involving the right frontal dural based as well as right parietal lesion performed on 05/07/2012 under the care of Dr. Vallarie Mare at [facility]  CURRENT THERAPY: Maintenance systemic chemotherapy with Alimta 500 MG/M2 every 3 weeks, status post 148 cycles.  INTERVAL HISTORY: Danielle [name] 66 y.o. female returns to the clinic today for follow-up visit accompanied by her sister. The patient tolerated the last cycle of her treatment well with no significant complaints except for one or 2 episodes of nausea. She denied having any weight loss or night sweats. She denied having any chest pain, shortness of breath, cough or hemoptysis. She has no fever or chills. She denied having any  headache or visual changes. She is here today for evaluation before starting cycle #149.   MEDICAL HISTORY: Past Medical History:  Diagnosis Date  . Anemia   . 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 CVA (cerebral vascular accident) ([place]) 04/2014   pt states had 2 cva's within 2 wks  . DJD (degenerative joint disease), cervical   . Fibromyalgia   . H/O: pneumonia   . History of tobacco abuse quit 10/08   on nicotine patch  . Hypertension   . Hypokalemia   . Lung cancer ([place]) dx'd 09/2005   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 had chemo and radiation for lung ca  . Thrush 12/11/2010    ALLERGIES:  is allergic to contrast media [iodinated diagnostic agents]; iohexol; sulfa drugs cross reactors; and co-trimoxazole injection [sulfamethoxazole-trimethoprim].  MEDICATIONS:  Current Outpatient Prescriptions  Medication Sig Dispense Refill  . ALPRAZolam (XANAX) 0.25 MG tablet Take 1 tablet (0.25 mg total) by mouth at bedtime as needed. for sleep 30 tablet 0  . amLODipine (NORVASC) 10 MG tablet TAKE 1 TABLET (10 MG TOTAL) BY MOUTH DAILY. 90 tablet 1  . aspirin EC 325 MG tablet Take 1 tablet (325 mg total) by mouth daily. 90 tablet 0  . atorvastatin (LIPITOR) 40 MG tablet TAKE 1 TABLET BY MOUTH EVERY DAY AT 6 PM 90 tablet 0  . diphenhydrAMINE (BENADRYL) 25 MG tablet Take 1 tablet (25 mg total) by mouth once. 2 hours before CT scan on 09/18/2015 10 tablet 0  . FeFum-FePoly-FA-B Cmp-C-Biot (INTEGRA PLUS) CAPS Take 1 capsule by mouth every morning. 90 capsule 1  . fluticasone (FLONASE) 50 MCG/ACT nasal spray Place 1-2 sprays into  both nostrils daily as needed. 16 g 5  . folic acid (FOLVITE) 1 MG tablet TAKE 1 TABLET BY MOUTH DAILY. 90 tablet 1  . lidocaine-prilocaine (EMLA) cream Apply to Port-A-Cath as directed 1-2 hours prior to chemotherapy 30 g 1  . lisinopril (PRINIVIL,ZESTRIL) 20 MG tablet TAKE 2 TABLETS (40 MG TOTAL) BY MOUTH DAILY. 180 tablet 1  . morphine (MS  CONTIN) 60 MG 12 hr tablet Take 1 tablet (60 mg total) by mouth 2 (two) times daily. 60 tablet 0  . morphine (MSIR) 30 MG tablet Take 1 tablet (30 mg total) by mouth every 4 (four) hours as needed (breakthrough pain). 60 tablet 0  . Olopatadine HCl 0.2 % SOLN Apply 1 drop to eye daily. 2.5 mL 11  . ondansetron (ZOFRAN) 8 MG tablet TAKE 1 TABLET (8 MG TOTAL) BY MOUTH 2 (TWO) TIMES DAILY AS NEEDED FOR NAUSEA OR VOMITING. 30 tablet 2  . predniSONE (DELTASONE) 50 MG tablet One tablet by mouth 13 hour and 2 hours before the CT scan. (Patient not taking: Reported on 03/05/2016) 2 tablet 0   No current facility-administered medications for this visit.     SURGICAL HISTORY:  Past Surgical History:  Procedure Laterality Date  . CERVICAL LAMINECTOMY  1995  . KNEE SURGERY  1990   Left x 2  . 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 LOOP RECORDER IMPLANT N/A 04/19/2014   Procedure: LOOP RECORDER IMPLANT;  Surgeon: Thompson Grayer, MD;  Location: [facility] CATH LAB;  Service: Cardiovascular;  Laterality: N/A;  . OTHER SURGICAL HISTORY  2008   Gamma knife surgery to remove brain met   . PORTACATH PLACEMENT  -[name]   TIP IN CAVOATRIAL JUNCTION  . TEE WITHOUT CARDIOVERSION N/A 04/19/2014   Procedure: TRANSESOPHAGEAL ECHOCARDIOGRAM (TEE);  Surgeon: Larey Dresser, MD;  Location: [facility] ENDOSCOPY;  Service: Cardiovascular;  Laterality: N/A;    REVIEW OF SYSTEMS:  A comprehensive review of systems was negative.   PHYSICAL EXAMINATION: General appearance: alert, cooperative and no distress Head: Normocephalic, without obvious abnormality, atraumatic Neck: no adenopathy, no JVD, supple, symmetrical, trachea midline and thyroid not enlarged, symmetric, no tenderness/mass/nodules Lymph nodes: Cervical, supraclavicular, and axillary nodes normal. Resp: clear to auscultation bilaterally Back: symmetric, no curvature. ROM normal. No CVA tenderness. Cardio: regular rate and rhythm, S1, S2  normal, no murmur, click, rub or gallop GI: soft, non-tender; bowel sounds normal; no masses,  no organomegaly Extremities: extremities normal, atraumatic, no cyanosis or edema  ECOG PERFORMANCE STATUS: 1 - Symptomatic but completely ambulatory  Blood pressure (!) 148/60, pulse (!) 103, temperature 98.5 F (36.9 C), temperature source Oral, resp. rate 18, height _0  (1.6 m), weight 137 lb 12.8 oz (62.5 kg), SpO2 97 %.  LABORATORY DATA: Lab Results  Component Value Date   WBC 9.4 03/26/2016   HGB 13.0 03/26/2016   HCT 38.1 03/26/2016   MCV 94.9 03/26/2016   PLT 364 03/26/2016      Chemistry      Component Value Date/Time   NA 143 03/05/2016 0911   K 4.2 03/05/2016 0911   CL 103 01/26/2015 0818   CL 106 06/30/2012 1004   CO2 27 03/05/2016 0911   BUN 11.1 03/05/2016 0911   CREATININE 0.9 03/05/2016 0911      Component Value Date/Time   CALCIUM 10.1 03/05/2016 0911   ALKPHOS 89 03/05/2016 0911   AST 21 03/05/2016 0911   ALT 14 03/05/2016 0911  BILITOT 0.37 03/05/2016 0911       RADIOGRAPHIC STUDIES: No results found.  ASSESSMENT AND PLAN: This is a very pleasant 66 years old white female with metastatic non-small cell lung cancer diagnosed in October 2007 status post concurrent chemoradiation followed by consolidation chemotherapy with 1 cycle of docetaxel followed by stereotactic radiotherapy to metastatic brain lesions. The patient had evidence for disease progression and she was treated with systemic chemotherapy was carboplatin and Alimta for 6 cycles with partial response. She is currently on maintenance treatment with single agent Alimta status post 148 cycles. She is rating her treatment well with no significant adverse effects. I recommended for her to proceed with cycle 149 today. She will have repeat MRI of the brain at [facility] in the next few weeks for evaluation of her metastatic brain lesions. The patient would come back for follow-up visit  in 3 weeks for evaluation before cycle 150. I gave her a refill for Xanax today. She was advised to call immediately if she has any concerning symptoms in the interval. The patient voices understanding of current disease status and treatment options and is in agreement with the current care plan.  All questions were answered. The patient knows to call the clinic with any problems, questions or concerns. We can certainly see the patient much sooner if necessary. I spent 10 minutes counseling the patient face to face. The total time spent in the appointment was 15 minutes.  Disclaimer: This note was dictated with voice recognition software. Similar sounding words can inadvertently be transcribed and may not be corrected upon review.

## 2016-03-26 NOTE — Patient Instructions (Signed)
[facility] Discharge Instructions for Patients Receiving Chemotherapy  Today you received the following chemotherapy agents; Alimta.   To help prevent nausea and vomiting after your treatment, we encourage you to take your nausea medication as directed.    If you develop nausea and vomiting that is not controlled by your nausea medication, call the clinic.   BELOW ARE SYMPTOMS THAT SHOULD BE REPORTED IMMEDIATELY:  *FEVER GREATER THAN 100.5 F  *CHILLS WITH OR WITHOUT FEVER  NAUSEA AND VOMITING THAT IS NOT CONTROLLED WITH YOUR NAUSEA MEDICATION  *UNUSUAL SHORTNESS OF BREATH  *UNUSUAL BRUISING OR BLEEDING  TENDERNESS IN MOUTH AND THROAT WITH OR WITHOUT PRESENCE OF ULCERS  *URINARY PROBLEMS  *BOWEL PROBLEMS  UNUSUAL RASH Items with * indicate a potential emergency and should be followed up as soon as possible.  Feel free to call the clinic you have any questions or concerns. The clinic phone number is [phone].  Please show the CHEMO ALERT CARD at check-in to the Emergency Department and triage nurse.   

## 2016-03-26 NOTE — Telephone Encounter (Signed)
Patient bypassed scheduling/check out area. Called patient to schedule lab, follow up and Chemo, per 03/26/16 los. Appointments confirmed with patient for 05/28/16, per 03/26/16 los.

## 2016-04-03 DIAGNOSIS — C3490 Malignant neoplasm of unspecified part of unspecified bronchus or lung: Secondary | ICD-10-CM | POA: Diagnosis not present

## 2016-04-03 DIAGNOSIS — C7931 Secondary malignant neoplasm of brain: Secondary | ICD-10-CM | POA: Diagnosis not present

## 2016-04-06 ENCOUNTER — Other Ambulatory Visit: Payer: Self-pay | Admitting: Family Medicine

## 2016-04-09 ENCOUNTER — Ambulatory Visit (INDEPENDENT_AMBULATORY_CARE_PROVIDER_SITE_OTHER): Payer: Medicare Other | Admitting: *Deleted

## 2016-04-09 DIAGNOSIS — I639 Cerebral infarction, unspecified: Secondary | ICD-10-CM

## 2016-04-09 NOTE — Progress Notes (Signed)
[facility] Summary Report / Loop Recorder 

## 2016-04-11 LAB — CUP PACEART REMOTE DEVICE CHECK
Implantable Pulse Generator Implant Date: 20160412
MDC IDC SESS DTM: 20180403053948

## 2016-04-16 ENCOUNTER — Encounter: Payer: Self-pay | Admitting: Internal Medicine

## 2016-04-16 ENCOUNTER — Telehealth: Payer: Self-pay | Admitting: Internal Medicine

## 2016-04-16 ENCOUNTER — Ambulatory Visit (HOSPITAL_BASED_OUTPATIENT_CLINIC_OR_DEPARTMENT_OTHER): Payer: Medicare Other | Admitting: Internal Medicine

## 2016-04-16 ENCOUNTER — Ambulatory Visit (HOSPITAL_BASED_OUTPATIENT_CLINIC_OR_DEPARTMENT_OTHER): Payer: Medicare Other

## 2016-04-16 ENCOUNTER — Other Ambulatory Visit (HOSPITAL_BASED_OUTPATIENT_CLINIC_OR_DEPARTMENT_OTHER): Payer: Medicare Other

## 2016-04-16 DIAGNOSIS — C3411 Malignant neoplasm of upper lobe, right bronchus or lung: Secondary | ICD-10-CM

## 2016-04-16 DIAGNOSIS — C7931 Secondary malignant neoplasm of brain: Secondary | ICD-10-CM

## 2016-04-16 DIAGNOSIS — C3491 Malignant neoplasm of unspecified part of right bronchus or lung: Secondary | ICD-10-CM

## 2016-04-16 DIAGNOSIS — C3490 Malignant neoplasm of unspecified part of unspecified bronchus or lung: Secondary | ICD-10-CM

## 2016-04-16 DIAGNOSIS — C3410 Malignant neoplasm of upper lobe, unspecified bronchus or lung: Secondary | ICD-10-CM

## 2016-04-16 DIAGNOSIS — Z5111 Encounter for antineoplastic chemotherapy: Secondary | ICD-10-CM

## 2016-04-16 LAB — COMPREHENSIVE METABOLIC PANEL
ALBUMIN: 3.5 g/dL (ref 3.5–5.0)
ALK PHOS: 87 U/L (ref 40–150)
ALT: 13 U/L (ref 0–55)
AST: 21 U/L (ref 5–34)
Anion Gap: 9 mEq/L (ref 3–11)
BILIRUBIN TOTAL: 0.43 mg/dL (ref 0.20–1.20)
BUN: 13.3 mg/dL (ref 7.0–26.0)
CALCIUM: 9.8 mg/dL (ref 8.4–10.4)
CO2: 25 mEq/L (ref 22–29)
CREATININE: 0.8 mg/dL (ref 0.6–1.1)
Chloride: 108 mEq/L (ref 98–109)
EGFR: 75 mL/min/{1.73_m2} — ABNORMAL LOW (ref >90)
GLUCOSE: 111 mg/dL (ref 70–140)
Potassium: 3.9 mEq/L (ref 3.5–5.1)
Sodium: 142 mEq/L (ref 136–145)
Total Protein: 7 g/dL (ref 6.4–8.3)

## 2016-04-16 LAB — CBC WITH DIFFERENTIAL/PLATELET
BASO%: 0.4 % (ref 0.0–2.0)
Basophils Absolute: 0 10*3/uL (ref 0.0–0.1)
EOS ABS: 0.1 10*3/uL (ref 0.0–0.5)
EOS%: 0.8 % (ref 0.0–7.0)
HEMATOCRIT: 38.5 % (ref 34.8–46.6)
HEMOGLOBIN: 12.5 g/dL (ref 11.6–15.9)
LYMPH%: 31.7 % (ref 14.0–49.7)
MCH: 30.9 pg (ref 25.1–34.0)
MCHC: 32.5 g/dL (ref 31.5–36.0)
MCV: 95.3 fL (ref 79.5–101.0)
MONO#: 0.7 10*3/uL (ref 0.1–0.9)
MONO%: 10 % (ref 0.0–14.0)
NEUT%: 57.1 % (ref 38.4–76.8)
NEUTROS ABS: 4.1 10*3/uL (ref 1.5–6.5)
Platelets: 352 10*3/uL (ref 145–400)
RBC: 4.04 10*6/uL (ref 3.70–5.45)
RDW: 13.9 % (ref 11.2–14.5)
WBC: 7.2 10*3/uL (ref 3.9–10.3)
lymph#: 2.3 10*3/uL (ref 0.9–3.3)

## 2016-04-16 MED ORDER — HEPARIN SOD (PORK) LOCK FLUSH 100 UNIT/ML IV SOLN
500.0000 [IU] | Freq: Once | INTRAVENOUS | Status: AC | PRN
  Administered 2016-04-16: 500 [IU]
  Filled 2016-04-16: qty 5

## 2016-04-16 MED ORDER — ONDANSETRON HCL 8 MG PO TABS
8.0000 mg | ORAL_TABLET | Freq: Once | ORAL | Status: AC
  Administered 2016-04-16: 8 mg via ORAL

## 2016-04-16 MED ORDER — DEXAMETHASONE SODIUM PHOSPHATE 10 MG/ML IJ SOLN
10.0000 mg | Freq: Once | INTRAMUSCULAR | Status: AC
  Administered 2016-04-16: 10 mg via INTRAVENOUS

## 2016-04-16 MED ORDER — SODIUM CHLORIDE 0.9 % IV SOLN
Freq: Once | INTRAVENOUS | Status: AC
  Administered 2016-04-16: 11:00:00 via INTRAVENOUS

## 2016-04-16 MED ORDER — MORPHINE SULFATE 30 MG PO TABS: 30.0000 mg | ORAL_TABLET | ORAL | 0 refills | Status: DC | PRN

## 2016-04-16 MED ORDER — CYANOCOBALAMIN 1000 MCG/ML IJ SOLN
1000.0000 ug | Freq: Once | INTRAMUSCULAR | Status: AC
  Administered 2016-04-16: 1000 ug via INTRAMUSCULAR

## 2016-04-16 MED ORDER — DEXAMETHASONE SODIUM PHOSPHATE 10 MG/ML IJ SOLN
INTRAMUSCULAR | Status: AC
  Filled 2016-04-16: qty 1

## 2016-04-16 MED ORDER — SODIUM CHLORIDE 0.9 % IJ SOLN
10.0000 mL | INTRAMUSCULAR | Status: DC | PRN
  Administered 2016-04-16: 10 mL
  Filled 2016-04-16: qty 10

## 2016-04-16 MED ORDER — PREDNISONE 50 MG PO TABS: ORAL_TABLET | ORAL | 0 refills | Status: DC

## 2016-04-16 MED ORDER — MORPHINE SULFATE ER 60 MG PO TBCR: 60.0000 mg | EXTENDED_RELEASE_TABLET | Freq: Two times a day (BID) | ORAL | 0 refills | Status: DC

## 2016-04-16 MED ORDER — SODIUM CHLORIDE 0.9 % IV SOLN
500.0000 mg/m2 | Freq: Once | INTRAVENOUS | Status: AC
  Administered 2016-04-16: 900 mg via INTRAVENOUS
  Filled 2016-04-16: qty 20

## 2016-04-16 MED ORDER — ONDANSETRON HCL 8 MG PO TABS
ORAL_TABLET | ORAL | Status: AC
  Filled 2016-04-16: qty 1

## 2016-04-16 MED ORDER — CYANOCOBALAMIN 1000 MCG/ML IJ SOLN
INTRAMUSCULAR | Status: AC
  Filled 2016-04-16: qty 1

## 2016-04-16 NOTE — Patient Instructions (Signed)
[facility] Discharge Instructions for Patients Receiving Chemotherapy  Today you received the following chemotherapy agents; Alimta.   To help prevent nausea and vomiting after your treatment, we encourage you to take your nausea medication as directed.    If you develop nausea and vomiting that is not controlled by your nausea medication, call the clinic.   BELOW ARE SYMPTOMS THAT SHOULD BE REPORTED IMMEDIATELY:  *FEVER GREATER THAN 100.5 F  *CHILLS WITH OR WITHOUT FEVER  NAUSEA AND VOMITING THAT IS NOT CONTROLLED WITH YOUR NAUSEA MEDICATION  *UNUSUAL SHORTNESS OF BREATH  *UNUSUAL BRUISING OR BLEEDING  TENDERNESS IN MOUTH AND THROAT WITH OR WITHOUT PRESENCE OF ULCERS  *URINARY PROBLEMS  *BOWEL PROBLEMS  UNUSUAL RASH Items with * indicate a potential emergency and should be followed up as soon as possible.  Feel free to call the clinic you have any questions or concerns. The clinic phone number is [phone].  Please show the CHEMO ALERT CARD at check-in to the Emergency Department and triage nurse.   

## 2016-04-16 NOTE — Progress Notes (Signed)
Danielle [name]:([phone]   Fax:(336) [phone]  OFFICE PROGRESS NOTE  [name], MD [place] [address]  DIAGNOSIS: Metastatic non-small cell lung cancer initially diagnosed as locally advanced stage IIIB with a right Pancoast tumor involving the vertebral body as well as prominent canal invasion with spinal cord compression in October 2007. The patient also has metastatic disease to the brain in April 2008.  PRIOR THERAPY: 1. Status post concurrent chemoradiation with weekly carboplatin and paclitaxel, last dose was given November 18, 2005. 2. 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 3. Status post gamma knife radiotherapy to a solitary brain lesion located in the superior frontal area of the brain at [facility] in April of 2008. 4. Status post palliative radiotherapy to the lateral abdominal wall metastatic lesion under the care of Dr. Lisbeth Renshaw, completed March of 2009. 5. Status post 6 cycles of systemic chemotherapy with carboplatin and Alimta. Last dose was given July 26, 2007 with disease stabilization. 6. Gamma knife stereotactic radiotherapy to 2 brain lesions one involving the right frontal dural based as well as right parietal lesion performed on 05/07/2012 under the care of Dr. Vallarie Mare at [facility].  CURRENT THERAPY: Maintenance systemic chemotherapy with Alimta 500 MG/M2 every 3 weeks, status post 149 cycles.  INTERVAL HISTORY: Danielle [name] 66 y.o. female returns to the clinic today for follow-up visit accompanied by her sister. The patient is feeling fine today with no specific complaints. She denied having any chest pain, shortness of breath, cough or hemoptysis. She has no nausea, vomiting, diarrhea or constipation. She has no fever or chills. She denied having any significant weight loss or night sweats. She has been tolerating  her treatment with maintenance Alimta fairly well. She is here today for evaluation before starting cycle 150.  MEDICAL HISTORY: Past Medical History:  Diagnosis Date  . Anemia   . 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 CVA (cerebral vascular accident) ([place]) 04/2014   pt states had 2 cva's within 2 wks  . DJD (degenerative joint disease), cervical   . Fibromyalgia   . H/O: pneumonia   . History of tobacco abuse quit 10/08   on nicotine patch  . Hypertension   . Hypokalemia   . Lung cancer ([place]) dx'd 09/2005   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 had chemo and radiation for lung ca  . Thrush 12/11/2010    ALLERGIES:  is allergic to contrast media [iodinated diagnostic agents]; iohexol; sulfa drugs cross reactors; and co-trimoxazole injection [sulfamethoxazole-trimethoprim].  MEDICATIONS:  Current Outpatient Prescriptions  Medication Sig Dispense Refill  . ALPRAZolam (XANAX) 0.25 MG tablet Take 1 tablet (0.25 mg total) by mouth at bedtime as needed. for sleep 30 tablet 0  . amLODipine (NORVASC) 10 MG tablet TAKE 1 TABLET (10 MG TOTAL) BY MOUTH DAILY. 90 tablet 1  . aspirin EC 325 MG tablet Take 1 tablet (325 mg total) by mouth daily. 90 tablet 0  . atorvastatin (LIPITOR) 40 MG tablet TAKE 1 TABLET BY MOUTH EVERY DAY AT 6 PM 90 tablet 3  . FeFum-FePoly-FA-B Cmp-C-Biot (INTEGRA PLUS) CAPS Take 1 capsule by mouth every morning. 90 capsule 1  . fluticasone (FLONASE) 50 MCG/ACT nasal spray Place 1-2 sprays into both nostrils daily as needed. 16 g 5  . folic acid (FOLVITE) 1 MG tablet TAKE 1 TABLET BY MOUTH DAILY. 90 tablet 1  .  furosemide (LASIX) 20 MG tablet     . lidocaine-prilocaine (EMLA) cream Apply to Port-A-Cath as directed 1-2 hours prior to chemotherapy 30 g 1  . lisinopril (PRINIVIL,ZESTRIL) 20 MG tablet TAKE 2 TABLETS (40 MG TOTAL) BY MOUTH DAILY. 180 tablet 1  . morphine (MS CONTIN) 60 MG 12 hr tablet Take 1 tablet (60 mg total) by mouth 2 (two) times  daily. 60 tablet 0  . morphine (MSIR) 30 MG tablet Take 1 tablet (30 mg total) by mouth every 4 (four) hours as needed (breakthrough pain). 60 tablet 0  . Olopatadine HCl 0.2 % SOLN Apply 1 drop to eye daily. 2.5 mL 11  . ondansetron (ZOFRAN) 8 MG tablet TAKE 1 TABLET (8 MG TOTAL) BY MOUTH 2 (TWO) TIMES DAILY AS NEEDED FOR NAUSEA OR VOMITING. 30 tablet 2  . predniSONE (DELTASONE) 50 MG tablet One tablet by mouth 13 hour and 2 hours before the CT scan. 2 tablet 0  . diphenhydrAMINE (BENADRYL) 25 MG tablet Take 1 tablet (25 mg total) by mouth once. 2 hours before CT scan on 09/18/2015 10 tablet 0   No current facility-administered medications for this visit.     SURGICAL HISTORY:  Past Surgical History:  Procedure Laterality Date  . CERVICAL LAMINECTOMY  1995  . KNEE SURGERY  1990   Left x 2  . 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 LOOP RECORDER IMPLANT N/A 04/19/2014   Procedure: LOOP RECORDER IMPLANT;  Surgeon: Thompson Grayer, MD;  Location: [facility] CATH LAB;  Service: Cardiovascular;  Laterality: N/A;  . OTHER SURGICAL HISTORY  2008   Gamma knife surgery to remove brain met   . PORTACATH PLACEMENT  -[name]   TIP IN CAVOATRIAL JUNCTION  . TEE WITHOUT CARDIOVERSION N/A 04/19/2014   Procedure: TRANSESOPHAGEAL ECHOCARDIOGRAM (TEE);  Surgeon: Larey Dresser, MD;  Location: [facility] ENDOSCOPY;  Service: Cardiovascular;  Laterality: N/A;    REVIEW OF SYSTEMS:  A comprehensive review of systems was negative.   PHYSICAL EXAMINATION: General appearance: alert, cooperative and no distress Head: Normocephalic, without obvious abnormality, atraumatic Neck: no adenopathy, no JVD, supple, symmetrical, trachea midline and thyroid not enlarged, symmetric, no tenderness/mass/nodules Lymph nodes: Cervical, supraclavicular, and axillary nodes normal. Resp: clear to auscultation bilaterally Back: symmetric, no curvature. ROM normal. No CVA tenderness. Cardio: regular rate and  rhythm, S1, S2 normal, no murmur, click, rub or gallop GI: soft, non-tender; bowel sounds normal; no masses,  no organomegaly Extremities: extremities normal, atraumatic, no cyanosis or edema  ECOG PERFORMANCE STATUS: 0 - Asymptomatic  Blood pressure 140/63, pulse 70, temperature 98 F (36.7 C), temperature source Oral, resp. rate 18, height _0  (1.6 m), weight 137 lb (62.1 kg), SpO2 99 %.  LABORATORY DATA: Lab Results  Component Value Date   WBC 7.2 04/16/2016   HGB 12.5 04/16/2016   HCT 38.5 04/16/2016   MCV 95.3 04/16/2016   PLT 352 04/16/2016      Chemistry      Component Value Date/Time   NA 142 04/16/2016 0900   K 3.9 04/16/2016 0900   CL 103 01/26/2015 0818   CL 106 06/30/2012 1004   CO2 25 04/16/2016 0900   BUN 13.3 04/16/2016 0900   CREATININE 0.8 04/16/2016 0900      Component Value Date/Time   CALCIUM 9.8 04/16/2016 0900   ALKPHOS 87 04/16/2016 0900   AST 21 04/16/2016 0900   ALT 13 04/16/2016 0900   BILITOT 0.43 04/16/2016  0900       RADIOGRAPHIC STUDIES: No results found.  ASSESSMENT AND PLAN: This is a very pleasant 66 years old white female with metastatic non-small cell lung cancer diagnosed in October 2007 status post concurrent chemoradiation followed by consolidation chemotherapy with 1 cycle of docetaxel discontinued secondary to intolerance. The patient also has a stereotactic radiotherapy to multiple brain lesions and her recent MRI of the brain at [facility] showed no evidence for disease progression. She is currently undergoing maintenance treatment with Alimta status post 149 cycles. She is tolerating the treatment well with no significant adverse effects. I recommended for the patient to proceed with cycle 150 today as a scheduled. I will see her back for follow-up visit in 3 weeks for reevaluation with repeat CT scan of the chest, abdomen and pelvis for restaging of her disease. For pain management I gave the patient referral  for MS Contin and morphine sulfate today. She was advised to call immediately if she has any concerning symptoms in the interval. The patient voices understanding of current disease status and treatment options and is in agreement with the current care plan. All questions were answered. The patient knows to call the clinic with any problems, questions or concerns. We can certainly see the patient much sooner if necessary. I spent 10 minutes counseling the patient face to face. The total time spent in the appointment was 15 minutes.  Disclaimer: This note was dictated with voice recognition software. Similar sounding words can inadvertently be transcribed and may not be corrected upon review.

## 2016-04-16 NOTE — Telephone Encounter (Signed)
Appointments scheduled per 04/16/16 los. Patient was given a copy of the AVS report and appointment schedule per, 04/16/16.

## 2016-04-21 ENCOUNTER — Other Ambulatory Visit: Payer: Self-pay | Admitting: Family Medicine

## 2016-04-26 ENCOUNTER — Other Ambulatory Visit: Payer: Self-pay | Admitting: *Deleted

## 2016-04-26 DIAGNOSIS — F411 Generalized anxiety disorder: Secondary | ICD-10-CM

## 2016-04-26 MED ORDER — ALPRAZOLAM 0.25 MG PO TABS: 0.2500 mg | ORAL_TABLET | Freq: Every evening | ORAL | 0 refills | Status: DC | PRN

## 2016-04-26 NOTE — Telephone Encounter (Signed)
Pt called for refill on xanax. rx refill phoned into pt pharmcy.

## 2016-05-03 ENCOUNTER — Ambulatory Visit (HOSPITAL_COMMUNITY)
Admission: RE | Admit: 2016-05-03 | Discharge: 2016-05-03 | Disposition: A | Discharge status: Home / Self Care | Payer: Medicare Other | Source: Ambulatory Visit | Attending: Internal Medicine | Admitting: Internal Medicine

## 2016-05-03 ENCOUNTER — Encounter (HOSPITAL_COMMUNITY): Payer: Self-pay

## 2016-05-03 DIAGNOSIS — K629 Disease of anus and rectum, unspecified: Secondary | ICD-10-CM | POA: Insufficient documentation

## 2016-05-03 DIAGNOSIS — C3490 Malignant neoplasm of unspecified part of unspecified bronchus or lung: Secondary | ICD-10-CM | POA: Insufficient documentation

## 2016-05-03 DIAGNOSIS — C3410 Malignant neoplasm of upper lobe, unspecified bronchus or lung: Secondary | ICD-10-CM | POA: Diagnosis not present

## 2016-05-03 MED ORDER — HEPARIN SOD (PORK) LOCK FLUSH 100 UNIT/ML IV SOLN: 500.0000 [IU] | Freq: Once | INTRAVENOUS | Status: DC

## 2016-05-03 MED ORDER — HEPARIN SOD (PORK) LOCK FLUSH 100 UNIT/ML IV SOLN
500.0000 [IU] | Freq: Once | INTRAVENOUS | Status: AC
  Administered 2016-05-03: 500 [IU] via INTRAVENOUS

## 2016-05-03 MED ORDER — HEPARIN SOD (PORK) LOCK FLUSH 100 UNIT/ML IV SOLN
INTRAVENOUS | Status: AC
  Filled 2016-05-03: qty 5

## 2016-05-03 MED ORDER — IOPAMIDOL (ISOVUE-300) INJECTION 61%
100.0000 mL | Freq: Once | INTRAVENOUS | Status: AC | PRN
  Administered 2016-05-03: 100 mL via INTRAVENOUS

## 2016-05-03 MED ORDER — IOPAMIDOL (ISOVUE-300) INJECTION 61%
INTRAVENOUS | Status: AC
  Filled 2016-05-03: qty 100

## 2016-05-07 ENCOUNTER — Ambulatory Visit (HOSPITAL_BASED_OUTPATIENT_CLINIC_OR_DEPARTMENT_OTHER): Payer: Medicare Other | Admitting: Internal Medicine

## 2016-05-07 ENCOUNTER — Other Ambulatory Visit (HOSPITAL_COMMUNITY): Payer: Self-pay

## 2016-05-07 ENCOUNTER — Other Ambulatory Visit (HOSPITAL_BASED_OUTPATIENT_CLINIC_OR_DEPARTMENT_OTHER): Payer: Medicare Other

## 2016-05-07 ENCOUNTER — Ambulatory Visit (HOSPITAL_BASED_OUTPATIENT_CLINIC_OR_DEPARTMENT_OTHER): Payer: Medicare Other

## 2016-05-07 ENCOUNTER — Encounter: Payer: Self-pay | Admitting: Internal Medicine

## 2016-05-07 ENCOUNTER — Telehealth: Payer: Self-pay | Admitting: Internal Medicine

## 2016-05-07 VITALS — BP 135/65 | HR 109 | Temp 98.0°F | Resp 20 | Ht 63.0 in | Wt 129.5 lb

## 2016-05-07 VITALS — HR 91

## 2016-05-07 DIAGNOSIS — R000 Tachycardia, unspecified: Secondary | ICD-10-CM

## 2016-05-07 DIAGNOSIS — C3491 Malignant neoplasm of unspecified part of right bronchus or lung: Secondary | ICD-10-CM

## 2016-05-07 DIAGNOSIS — C7931 Secondary malignant neoplasm of brain: Secondary | ICD-10-CM

## 2016-05-07 DIAGNOSIS — C3411 Malignant neoplasm of upper lobe, right bronchus or lung: Secondary | ICD-10-CM

## 2016-05-07 DIAGNOSIS — Z5111 Encounter for antineoplastic chemotherapy: Secondary | ICD-10-CM

## 2016-05-07 DIAGNOSIS — C7951 Secondary malignant neoplasm of bone: Secondary | ICD-10-CM

## 2016-05-07 LAB — CBC WITH DIFFERENTIAL/PLATELET
BASO%: 0.3 % (ref 0.0–2.0)
BASOS ABS: 0.1 10*3/uL (ref 0.0–0.1)
EOS%: 0 % (ref 0.0–7.0)
Eosinophils Absolute: 0 10*3/uL (ref 0.0–0.5)
HCT: 41.1 % (ref 34.8–46.6)
HEMOGLOBIN: 13.7 g/dL (ref 11.6–15.9)
LYMPH%: 10.1 % — ABNORMAL LOW (ref 14.0–49.7)
MCH: 31.4 pg (ref 25.1–34.0)
MCHC: 33.4 g/dL (ref 31.5–36.0)
MCV: 93.9 fL (ref 79.5–101.0)
MONO#: 1.7 10*3/uL — AB (ref 0.1–0.9)
MONO%: 9.9 % (ref 0.0–14.0)
NEUT#: 13.5 10*3/uL — ABNORMAL HIGH (ref 1.5–6.5)
NEUT%: 79.7 % — ABNORMAL HIGH (ref 38.4–76.8)
Platelets: 517 10*3/uL — ABNORMAL HIGH (ref 145–400)
RBC: 4.37 10*6/uL (ref 3.70–5.45)
RDW: 14.2 % (ref 11.2–14.5)
WBC: 17 10*3/uL — ABNORMAL HIGH (ref 3.9–10.3)
lymph#: 1.7 10*3/uL (ref 0.9–3.3)

## 2016-05-07 LAB — COMPREHENSIVE METABOLIC PANEL
ALBUMIN: 3.7 g/dL (ref 3.5–5.0)
ALK PHOS: 94 U/L (ref 40–150)
ALT: 13 U/L (ref 0–55)
AST: 18 U/L (ref 5–34)
Anion Gap: 11 mEq/L (ref 3–11)
BUN: 16.5 mg/dL (ref 7.0–26.0)
CALCIUM: 10.5 mg/dL — AB (ref 8.4–10.4)
CO2: 31 mEq/L — ABNORMAL HIGH (ref 22–29)
Chloride: 101 mEq/L (ref 98–109)
Creatinine: 1.1 mg/dL (ref 0.6–1.1)
EGFR: 54 mL/min/{1.73_m2} — AB (ref >90)
Glucose: 127 mg/dl (ref 70–140)
POTASSIUM: 4 meq/L (ref 3.5–5.1)
Sodium: 143 mEq/L (ref 136–145)
Total Bilirubin: 0.59 mg/dL (ref 0.20–1.20)
Total Protein: 7.5 g/dL (ref 6.4–8.3)

## 2016-05-07 MED ORDER — PEMETREXED DISODIUM CHEMO INJECTION 500 MG
500.0000 mg/m2 | Freq: Once | INTRAVENOUS | Status: AC
  Administered 2016-05-07: 900 mg via INTRAVENOUS
  Filled 2016-05-07: qty 20

## 2016-05-07 MED ORDER — DEXAMETHASONE SODIUM PHOSPHATE 10 MG/ML IJ SOLN
INTRAMUSCULAR | Status: AC
Start: 2016-05-07 — End: 2016-05-07
  Filled 2016-05-07: qty 1

## 2016-05-07 MED ORDER — DEXAMETHASONE SODIUM PHOSPHATE 10 MG/ML IJ SOLN
10.0000 mg | Freq: Once | INTRAMUSCULAR | Status: AC
  Administered 2016-05-07: 10 mg via INTRAVENOUS

## 2016-05-07 MED ORDER — PREDNISONE 50 MG PO TABS: ORAL_TABLET | ORAL | 0 refills | Status: DC

## 2016-05-07 MED ORDER — HEPARIN SOD (PORK) LOCK FLUSH 100 UNIT/ML IV SOLN
500.0000 [IU] | Freq: Once | INTRAVENOUS | Status: AC | PRN
  Administered 2016-05-07: 500 [IU]
  Filled 2016-05-07: qty 5

## 2016-05-07 MED ORDER — ONDANSETRON HCL 8 MG PO TABS
ORAL_TABLET | ORAL | Status: AC
  Filled 2016-05-07: qty 1

## 2016-05-07 MED ORDER — SODIUM CHLORIDE 0.9 % IJ SOLN
10.0000 mL | INTRAMUSCULAR | Status: DC | PRN
  Administered 2016-05-07: 10 mL
  Filled 2016-05-07: qty 10

## 2016-05-07 MED ORDER — ONDANSETRON HCL 8 MG PO TABS
8.0000 mg | ORAL_TABLET | Freq: Once | ORAL | Status: AC
  Administered 2016-05-07: 8 mg via ORAL

## 2016-05-07 MED ORDER — SODIUM CHLORIDE 0.9 % IV SOLN
Freq: Once | INTRAVENOUS | Status: AC
  Administered 2016-05-07: 11:00:00 via INTRAVENOUS

## 2016-05-07 NOTE — Telephone Encounter (Signed)
Gave patient AVS and calender per 5/1.

## 2016-05-07 NOTE — Patient Instructions (Signed)
[facility] Discharge Instructions for Patients Receiving Chemotherapy  Today you received the following chemotherapy agents; Alimta.   To help prevent nausea and vomiting after your treatment, we encourage you to take your nausea medication as directed.    If you develop nausea and vomiting that is not controlled by your nausea medication, call the clinic.   BELOW ARE SYMPTOMS THAT SHOULD BE REPORTED IMMEDIATELY:  *FEVER GREATER THAN 100.5 F  *CHILLS WITH OR WITHOUT FEVER  NAUSEA AND VOMITING THAT IS NOT CONTROLLED WITH YOUR NAUSEA MEDICATION  *UNUSUAL SHORTNESS OF BREATH  *UNUSUAL BRUISING OR BLEEDING  TENDERNESS IN MOUTH AND THROAT WITH OR WITHOUT PRESENCE OF ULCERS  *URINARY PROBLEMS  *BOWEL PROBLEMS  UNUSUAL RASH Items with * indicate a potential emergency and should be followed up as soon as possible.  Feel free to call the clinic you have any questions or concerns. The clinic phone number is [phone].  Please show the CHEMO ALERT CARD at check-in to the Emergency Department and triage nurse.   

## 2016-05-07 NOTE — Progress Notes (Signed)
[place] [phone]   [phone]  OFFICE PROGRESS NOTE  [name], MD [place] [address]  DIAGNOSIS: Metastatic non-small cell lung cancer initially diagnosed as locally advanced stage IIIB with a right Pancoast tumor involving the vertebral body as well as prominent canal invasion with spinal cord compression in October 2007. The patient also has metastatic disease to the brain in April 2008.  PRIOR THERAPY: 1. Status post concurrent chemoradiation with weekly carboplatin and paclitaxel, last dose was given November 18, 2005. 2. 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 3. Status post gamma knife radiotherapy to a solitary brain lesion located in the superior frontal area of the brain at [facility] in April of 2008. 4. Status post palliative radiotherapy to the lateral abdominal wall metastatic lesion under the care of Dr. Lisbeth Renshaw, completed March of 2009. 5. Status post 6 cycles of systemic chemotherapy with carboplatin and Alimta. Last dose was given July 26, 2007 with disease stabilization. 6. Gamma knife stereotactic radiotherapy to 2 brain lesions one involving the right frontal dural based as well as right parietal lesion performed on 05/07/2012 under the care of Dr. Vallarie Mare at [facility].  CURRENT THERAPY: Maintenance systemic chemotherapy with Alimta 500 MG/M2 every 3 weeks, status post 150 cycles.  INTERVAL HISTORY: Danielle [name] 66 y.o. female returns to the clinic today for follow-up visit accompanied by her sister. The patient is feeling fine today with no specific complaints. She is recovering from a recent viral infection and she had nausea for a few days but she is feeling much better now. She denied having any chest pain, shortness of breath, cough or hemoptysis. She denied having any fever or chills. She has no  current nausea, vomiting, diarrhea or constipation. She was supposed to have repeat CT scan of the chest, abdomen and pelvis before her visit but unfortunately the scan of the chest was not performed for unclear reason. She is here today for evaluation before starting cycle 151.   MEDICAL HISTORY: Past Medical History:  Diagnosis Date  . Anemia   . 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 CVA (cerebral vascular accident) ([place]) 04/2014   pt states had 2 cva's within 2 wks  . DJD (degenerative joint disease), cervical   . Fibromyalgia   . H/O: pneumonia   . History of tobacco abuse quit 10/08   on nicotine patch  . Hypertension   . Hypokalemia   . Lung cancer ([place]) dx'd 09/2005   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 had chemo and radiation for lung ca  . Thrush 12/11/2010    ALLERGIES:  is allergic to contrast media [iodinated diagnostic agents]; iohexol; sulfa drugs cross reactors; and co-trimoxazole injection [sulfamethoxazole-trimethoprim].  MEDICATIONS:  Current Outpatient Prescriptions  Medication Sig Dispense Refill  . ALPRAZolam (XANAX) 0.25 MG tablet Take 1 tablet (0.25 mg total) by mouth at bedtime as needed. for sleep 30 tablet 0  . amLODipine (NORVASC) 10 MG tablet TAKE 1 TABLET (10 MG TOTAL) BY MOUTH DAILY. 90 tablet 1  . aspirin EC 325 MG tablet Take 1 tablet (325 mg total) by mouth daily. 90 tablet 0  . atorvastatin (LIPITOR) 40 MG tablet TAKE 1 TABLET BY MOUTH EVERY DAY AT 6 PM 90 tablet 3  . diphenhydrAMINE (BENADRYL) 25 MG tablet Take 1 tablet (25 mg total) by mouth once. 2 hours before CT scan  on 09/18/2015 10 tablet 0  . FeFum-FePoly-FA-B Cmp-C-Biot (INTEGRA PLUS) CAPS Take 1 capsule by mouth every morning. 90 capsule 1  . fluticasone (FLONASE) 50 MCG/ACT nasal spray Place 1-2 sprays into both nostrils daily as needed. 16 g 5  . folic acid (FOLVITE) 1 MG tablet TAKE 1 TABLET BY MOUTH DAILY. 90 tablet 1  . furosemide (LASIX) 20 MG tablet     .  lidocaine-prilocaine (EMLA) cream Apply to Port-A-Cath as directed 1-2 hours prior to chemotherapy 30 g 1  . lisinopril (PRINIVIL,ZESTRIL) 20 MG tablet TAKE 2 TABLETS (40 MG TOTAL) BY MOUTH DAILY. 180 tablet 1  . morphine (MS CONTIN) 60 MG 12 hr tablet Take 1 tablet (60 mg total) by mouth 2 (two) times daily. 60 tablet 0  . morphine (MSIR) 30 MG tablet Take 1 tablet (30 mg total) by mouth every 4 (four) hours as needed (breakthrough pain). 60 tablet 0  . Olopatadine HCl 0.2 % SOLN Apply 1 drop to eye daily. 2.5 mL 11  . ondansetron (ZOFRAN) 8 MG tablet TAKE 1 TABLET (8 MG TOTAL) BY MOUTH 2 (TWO) TIMES DAILY AS NEEDED FOR NAUSEA OR VOMITING. 30 tablet 2  . predniSONE (DELTASONE) 50 MG tablet 1 tablet by mouth 13 hours and 2 hours before CT scan (Patient not taking: Reported on 05/07/2016) 2 tablet 0   No current facility-administered medications for this visit.     SURGICAL HISTORY:  Past Surgical History:  Procedure Laterality Date  . CERVICAL LAMINECTOMY  1995  . KNEE SURGERY  1990   Left x 2  . 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 LOOP RECORDER IMPLANT N/A 04/19/2014   Procedure: LOOP RECORDER IMPLANT;  Surgeon: Thompson Grayer, MD;  Location: [facility] CATH LAB;  Service: Cardiovascular;  Laterality: N/A;  . OTHER SURGICAL HISTORY  2008   Gamma knife surgery to remove brain met   . PORTACATH PLACEMENT  -[name]   TIP IN CAVOATRIAL JUNCTION  . TEE WITHOUT CARDIOVERSION N/A 04/19/2014   Procedure: TRANSESOPHAGEAL ECHOCARDIOGRAM (TEE);  Surgeon: Larey Dresser, MD;  Location: [facility];  Service: Cardiovascular;  Laterality: N/A;    REVIEW OF SYSTEMS:  Constitutional: negative Eyes: negative Ears, nose, mouth, throat, and face: negative Respiratory: negative Cardiovascular: negative Gastrointestinal: negative Genitourinary:negative Integument/breast: negative Hematologic/lymphatic: negative Musculoskeletal:negative Neurological: negative Behavioral/Psych:  negative Endocrine: negative Allergic/Immunologic: negative   PHYSICAL EXAMINATION: General appearance: alert, cooperative and no distress Head: Normocephalic, without obvious abnormality, atraumatic Neck: no adenopathy, no JVD, supple, symmetrical, trachea midline and thyroid not enlarged, symmetric, no tenderness/mass/nodules Lymph nodes: Cervical, supraclavicular, and axillary nodes normal. Resp: clear to auscultation bilaterally Back: symmetric, no curvature. ROM normal. No CVA tenderness. Cardio: regular rate and rhythm, S1, S2 normal, no murmur, click, rub or gallop GI: soft, non-tender; bowel sounds normal; no masses,  no organomegaly Extremities: extremities normal, atraumatic, no cyanosis or edema Neurologic: Alert and oriented X 3, normal strength and tone. Normal symmetric reflexes. Normal coordination and gait  ECOG PERFORMANCE STATUS: 0 - Asymptomatic  Blood pressure 135/65, pulse (!) 109, temperature 98 F (36.7 C), temperature source Oral, resp. rate 20, height '5\' 3"'  (1.6 m), weight 129 lb 8 oz (58.7 kg), SpO2 97 %.  LABORATORY DATA: Lab Results  Component Value Date   WBC 17.0 (H) 05/07/2016   HGB 13.7 05/07/2016   HCT 41.1 05/07/2016   MCV 93.9 05/07/2016   PLT 517 (H) 05/07/2016      Chemistry  Component Value Date/Time   NA 143 05/07/2016 0851   K 4.0 05/07/2016 0851   CL 103 01/26/2015 0818   CL 106 06/30/2012 1004   CO2 31 (H) 05/07/2016 0851   BUN 16.5 05/07/2016 0851   CREATININE 1.1 05/07/2016 0851      Component Value Date/Time   CALCIUM 10.5 (H) 05/07/2016 0851   ALKPHOS 94 05/07/2016 0851   AST 18 05/07/2016 0851   ALT 13 05/07/2016 0851   BILITOT 0.59 05/07/2016 0851       RADIOGRAPHIC STUDIES: Ct Abdomen Pelvis W Contrast  Result Date: 05/03/2016 CLINICAL DATA:  Lung cancer with omental, spine and brain metastasis. EXAM: CT ABDOMEN AND PELVIS WITH CONTRAST TECHNIQUE: Multidetector CT imaging of the abdomen and pelvis was performed  using the standard protocol following bolus administration of intravenous contrast. CONTRAST:  146m ISOVUE-300 IOPAMIDOL (ISOVUE-300) INJECTION 61% COMPARISON:  CT chest abdomen  pelvis 01/19/2016 FINDINGS: Lower chest: Lung bases are clear. Hepatobiliary: No focal hepatic lesion. No biliary duct dilatation. Gallbladder is normal. Common bile duct is normal. Pancreas: Pancreas is normal. No ductal dilatation. No pancreatic inflammation. Spleen: Normal spleen Adrenals/urinary tract: Adrenal glands and kidneys are normal. The ureters and bladder normal. Stomach/Bowel: Stomach, small bowel, appendix, and cecum are normal. The ascending, transverse descending colon normal. Sigmoid colon normal. There is asymmetric thickening at the anorectal junction on the RIGHT measuring 14 mm (image 78, series 2.) Vascular/Lymphatic: Abdominal aorta is normal caliber with atherosclerotic calcification. There is no retroperitoneal or periportal lymphadenopathy. No pelvic lymphadenopathy. Reproductive: Uterus and ovaries normal. Other: No of mental thickening.  No peritoneal metastasis. Musculoskeletal: No aggressive osseous lesion. IMPRESSION: 1. No evidence of metastatic carcinoma in the abdomen pelvis. 2. Asymmetric thickening of the posterior RIGHT anorectal junction. Recommend physical exam to evaluate for true lesion versus pseudo lesion. Electronically Signed   By: SSuzy BouchardM.D.   On: 05/03/2016 10:57    ASSESSMENT AND PLAN: This is a very pleasant 66years old white female with metastatic non-small cell lung cancer diagnosed in October 2007 status post concurrent chemoradiation followed by consolidation chemotherapy with 1 cycle of docetaxel discontinued secondary to intolerance. The patient was then started on treatment with maintenance Alimta status post 150 cycles. She has been tolerating the treatment well with no significant adverse effects. Her recent CT scan of the abdomen and pelvis showed no evidence for  disease progression. Unfortunately for unknown reason her CT scan of the chest was not performed. I will arrange for the CT scan to be done in the next few days. I recommended for the patient to proceed with cycle #151 today. For pain management she will continue on MS Contin and morphine sulfate. For the tachycardia, I encouraged the patient to increase his oral intake of fluid as she is recovering from recent viral infection. She would come back for follow-up visit in 3 weeks for evaluation before the next round of her treatment. She was advised to call immediately if she has any concerning symptoms in the interval. The patient voices understanding of current disease status and treatment options and is in agreement with the current care plan. All questions were answered. The patient knows to call the clinic with any problems, questions or concerns. We can certainly see the patient much sooner if necessary.  Disclaimer: This note was dictated with voice recognition software. Similar sounding words can inadvertently be transcribed and may not be corrected upon review.

## 2016-05-08 ENCOUNTER — Encounter: Payer: Self-pay | Admitting: Neurology

## 2016-05-08 ENCOUNTER — Ambulatory Visit (INDEPENDENT_AMBULATORY_CARE_PROVIDER_SITE_OTHER): Payer: Medicare Other | Admitting: Neurology

## 2016-05-08 ENCOUNTER — Other Ambulatory Visit: Payer: Self-pay | Admitting: Medical Oncology

## 2016-05-08 VITALS — BP 136/76 | HR 86 | Wt 130.1 lb

## 2016-05-08 DIAGNOSIS — C3490 Malignant neoplasm of unspecified part of unspecified bronchus or lung: Secondary | ICD-10-CM

## 2016-05-08 DIAGNOSIS — C3410 Malignant neoplasm of upper lobe, unspecified bronchus or lung: Secondary | ICD-10-CM

## 2016-05-08 DIAGNOSIS — E7800 Pure hypercholesterolemia, unspecified: Secondary | ICD-10-CM

## 2016-05-08 DIAGNOSIS — I63412 Cerebral infarction due to embolism of left middle cerebral artery: Secondary | ICD-10-CM

## 2016-05-08 MED ORDER — MORPHINE SULFATE 30 MG PO TABS: 30.0000 mg | ORAL_TABLET | ORAL | 0 refills | Status: DC | PRN

## 2016-05-08 MED ORDER — MORPHINE SULFATE ER 60 MG PO TBCR: 60.0000 mg | EXTENDED_RELEASE_TABLET | Freq: Two times a day (BID) | ORAL | 0 refills | Status: DC

## 2016-05-08 NOTE — Progress Notes (Signed)
STROKE NEUROLOGY FOLLOW UP NOTE  NAME: Danielle [name] DOB: [birth year]  REASON FOR VISIT: stroke follow up HISTORY FROM: pt and chart  Today we had Danielle pleasure of seeing Danielle [name] in follow-up at our Neurology Clinic. Pt was accompanied by no one.   History Summary Danielle [name] is a 66 y.o. 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 Although hypercoagulable state due to malignancy is a possibility, however, after discuss with her oncologist Dr. Rogue Jury, it was felt less likely. Stroke work up including TTE, CUS, TEE, DVT not revealing, therefore Loop recorder placed. EEG showed bifrontal sharps, so vimpat was started. LDL 105, switched from zocor to lipitor. She was d/c to home with ASA and lipitor and outpt speech therapy.  07/22/14 follow up - Danielle [name] has been doing well, language much improved and near at baseline now. She followed up with PCP and vimpat was discontinued with unknown reason. She continues to follow up with oncology for maintenance chemo. Her BP 99/66 but she is on norvasc and metoprolol. She is asking for driving.   11/07/14 follow up - pt has been doing well. Language normal now. Back to driving, denies any problem with driving. Loop recorder no afib events. EEG repeat was normal. She follows with oncology for routine maintenance infusion. Had recent MRI brain showed stable, no concern of brain mets relapse. BP stable today 109/65.  05/08/15 follow up - pt has been doing well. No recurrent stroke like symptoms. Language normal. Followed up with onc radiology in [facility] and repeat MRI brian showed no evidence of tumor. Also followed with oncologist and repeat panCT showed no  metastasis of lung cancer. Continues maintenance chemo. BP today 115/63. No other complains. Loop recorder no afib.  Interval History During Danielle interval time, pt has been doing well. No complains. Follows with oncology for lung cancer and continued on maintenance chemo, doing well. No new metastasis. BP today 136/76.   REVIEW OF SYSTEMS: Full 14 system review of systems performed and notable only for those listed below and in HPI above, all others are negative:  Constitutional:   Cardiovascular:  Ear/Nose/Throat:   Skin:  Eyes:   Respiratory:   Gastroitestinal:   Genitourinary:  Hematology/Lymphatic:   Endocrine:  Musculoskeletal:  Back pain Allergy/Immunology:   Neurological:   Psychiatric:  Sleep: restless leg, insomnia  Danielle following represents Danielle [name]'s updated allergies and side effects list: Allergies  Allergen Reactions  . Contrast Media [Iodinated Diagnostic Agents] Hives and Other (See Comments)    No reaction provided.  . Iohexol Hives     pt needs 13 hour prep--prev hives; added allergy 06/02/07 slg   . Sulfa Drugs Cross Reactors Hives  . Co-Trimoxazole Injection [Sulfamethoxazole-Trimethoprim] Rash    Danielle neurologically relevant items on Danielle [name]'s problem list were reviewed on today's visit.  Neurologic Examination  A problem focused neurological exam (12 or more points of Danielle single system neurologic examination, vital signs counts as 1 point, cranial nerves count for 8 points) was performed.  Blood pressure 136/76, pulse 86, weight 130 lb 1.6 oz (59 kg).  General - Well nourished, well developed, in no apparent distress.  Ophthalmologic - Fundi not visualized due to eye movement.  Cardiovascular - Regular  rate and rhythm with no murmur.  Mental Status -  Level of arousal and orientation to time, place, and person were intact. Language including expression, name, repetition, comprehension was assessed and found intact.  Cranial Nerves II - XII  - II - Visual field intact OU. III, IV, VI - Extraocular movements intact. V - Facial sensation intact bilaterally. VII - Facial movement intact bilaterally. VIII - Hearing & vestibular intact bilaterally. X - Palate elevates symmetrically. XI - Chin turning & shoulder shrug intact bilaterally. XII - Tongue protrusion intact.  Motor Strength - Danielle [name]'s strength was normal in all extremities and pronator drift was absent.  Bulk was normal and fasciculations were absent.   Motor Tone - Muscle tone was assessed at Danielle neck and appendages and was normal.  Reflexes - Danielle [name]'s reflexes were 1+ in all extremities and she had no pathological reflexes.  Sensory - Light touch, temperature/pinprick, vibration and proprioception, and Romberg testing were assessed and were normal.    Coordination - Danielle [name] had normal movements in Danielle hands and feet with no ataxia or dysmetria.  Tremor was absent.  Gait and Station - Danielle [name]'s transfers, posture, gait, station, and turns were observed as normal.  Data reviewed: I personally reviewed Danielle images and agree with Danielle radiology interpretations.  Dg Chest 2 View  04/18/2014 IMPRESSION: No active cardiopulmonary disease.   Ct Head (brain) Wo Contrast  04/16/2014 IMPRESSION: Subtle area of low attenuation in Danielle left frontal lobe. This could represent focal ischemia or metastasis. Suggest MRI for further evaluation. No acute intracranial hemorrhage. No significant mass effect.   Mr Jeri Cos Wo Contrast  04/17/2014 IMPRESSION: Moderate-size acute hemorrhagic anterior left frontal lobe infarct with extension to Danielle anterior left peri operculum region and subinsular region. Mild local mass effect. Small acute scattered right parietal lobe infarcts. Remote small right lenticular nucleus infarct. No intracranial enhancing lesion or bony destructive lesion to suggest Danielle presence of intracranial metastatic disease. Paranasal sinus  mucosal thickening/ partial opacification most notable maxillary sinuses, ethmoid sinus air cells and right sphenoid sinus.   US Renal  04/17/2014 IMPRESSION: Normal renal ultrasound. No evidence of hydronephrosis. Decompressed bladder.   Mra Head/brain Wo Cm  04/18/2014 IMPRESSION: No large vessel occlusion. Decreased number of distal LEFT middle cerebral artery branches. LEFT frontal lobe hemorrhage versus enhancement partially imaged.    Carotid Doppler Bilateral: 1-39% ICA stenosis. Vertebral artery flow is antegrade.  2D Echocardiogram - Left ventricle: Danielle cavity size was normal. Wall thickness was normal. Systolic function was normal. Danielle estimated ejection fraction was in Danielle range of 55% to 60%. Wall motion was normal; there were no regional wall motion abnormalities. Left ventricular diastolic function parameters were normal.  UE and LE venous Doppler - negative for DVT  EEG - This is an abnormal electroencephalogram secondary to slowing and sharp activity over Danielle left frontotemporal region consistent with Danielle [name]'s history of a frontal lobe hemorrhage. Also noted were periods of rhythmical bifrontal sharp activity that are concerning for electrographic and electroclinical seizure activity.   EEG repeat 08/01/14 - normal EEG in Danielle waking state. No evidence of ictal or interictal discharges are seen.  TEE Normal LV size and systolic function, EF 21-19%. Normal regional wall motion. Normal RV size and systolic function. Trivial MR. Trivial TR, peak RV-RA gradient 37 mmHg. Trileaflet aortic valve with no AS or AI. Normal atrial sizes with no LAA thrombus. Catheter ends in right atrium, no clot or vegetation. Negative bubble  study, no evidence for PFO or ASD. Normal caliber aorta, grade II plaque descending thoracic aorta.  Loop recorder - no afib so far.  Pan CT 01/06/15 - 1. Interval stability of two small pleural-based right upper lobe pulmonary  nodules. 2. No new sites of metastatic disease in Danielle chest, abdomen or pelvis. 3. Mild-to-moderate centrilobular emphysema and diffuse bronchial wall thickening, suggesting COPD.  MRI 03/28/15 1. No acute intracranial abnormality. No evidence of new intracranial metastatic disease. Small focus of enhancement along Danielle inner table of Danielle right frontal bone within Danielle region of Danielle previously treated metastatic lesion appears slightly more conspicuous on today's exam; however, this is favored to be related to technique given its appearance across multiple prior studies. 2. Stable appearance of Danielle remote left frontal infarct.   Component     Latest Ref Rng 04/18/2014  Cholesterol     0 - 200 mg/dL 173  Triglycerides     <150 mg/dL 116  HDL Cholesterol     >39 mg/dL 45  Total CHOL/HDL Ratio      3.8  VLDL     0 - 40 mg/dL 23  LDL (calc)     0 - 99 mg/dL 105 (H)  Hemoglobin A1C     4.8 - 5.6 % 6.1 (H)  Mean Plasma Glucose      128    Assessment: As you may recall, she is a 66 y.o. 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 Although hypercoagulable state due to malignancy is a possibility, however, after discuss with her oncologist Dr. Rogue Jury, it was felt less likely. Stroke work up including TTE, CUS, TEE, DVT not revealing, therefore Loop recorder placed. EEG showed bifrontal sharps, so vimpat was started. She was d/c to home with ASA and lipitor. Overtime, language much improved and now normal. However, her vimpat was d/c with unclear reason. She has no seizure activity. Repeat EEG showed normal EEG. Back to driving. Followed up with rad oncology and repeat MRI brain showed no brain metastasis. Continue maintenance chemo. Loop  recorder no afib so far.   Plan:  - continue ASA and lipitor for stroke prevention - continue to monitor loop recorder - follow up with oncologist regularly  - Follow up with your primary care physician for stroke risk factor modification. Recommend maintain blood pressure goal <130/80, diabetes with hemoglobin A1c goal below 6.5% and lipids with LDL cholesterol goal below 70 mg/dL.  - check BP at home - healthy diet and regular exercise  - follow up as needed.   No orders of Danielle defined types were placed in this encounter.   No orders of Danielle defined types were placed in this encounter.   [name] Instructions  - continue ASA and lipitor for stroke prevention - continue to monitor loop recorder - follow up with oncologist regularly  - Follow up with your primary care physician for stroke risk factor modification. Recommend maintain blood pressure goal <130/80, diabetes with hemoglobin A1c goal below 6.5% and lipids with LDL cholesterol goal below 70 mg/dL.  - check BP at home - healthy diet and regular exercise  - follow up as needed.   Rosalin Hawking, MD PhD [facility] [address], [place], [place] [address] [phone]

## 2016-05-08 NOTE — Patient Instructions (Addendum)
-   continue ASA and lipitor for stroke prevention - continue to monitor loop recorder - follow up with oncologist regularly  - Follow up with your primary care physician for stroke risk factor modification. Recommend maintain blood pressure goal <130/80, diabetes with hemoglobin A1c goal below 6.5% and lipids with LDL cholesterol goal below 70 mg/dL.  - check BP at home - healthy diet and regular exercise  - follow up as needed.

## 2016-05-09 ENCOUNTER — Ambulatory Visit (HOSPITAL_COMMUNITY)
Admission: RE | Admit: 2016-05-09 | Discharge: 2016-05-09 | Disposition: A | Discharge status: Home / Self Care | Payer: Medicare Other | Source: Ambulatory Visit | Attending: Internal Medicine | Admitting: Internal Medicine

## 2016-05-09 ENCOUNTER — Ambulatory Visit (INDEPENDENT_AMBULATORY_CARE_PROVIDER_SITE_OTHER): Payer: Medicare Other | Admitting: *Deleted

## 2016-05-09 DIAGNOSIS — J439 Emphysema, unspecified: Secondary | ICD-10-CM | POA: Diagnosis not present

## 2016-05-09 DIAGNOSIS — I639 Cerebral infarction, unspecified: Secondary | ICD-10-CM | POA: Diagnosis not present

## 2016-05-09 DIAGNOSIS — C3410 Malignant neoplasm of upper lobe, unspecified bronchus or lung: Secondary | ICD-10-CM | POA: Diagnosis not present

## 2016-05-09 DIAGNOSIS — C3490 Malignant neoplasm of unspecified part of unspecified bronchus or lung: Secondary | ICD-10-CM

## 2016-05-09 DIAGNOSIS — R918 Other nonspecific abnormal finding of lung field: Secondary | ICD-10-CM | POA: Insufficient documentation

## 2016-05-09 MED ORDER — HEPARIN SOD (PORK) LOCK FLUSH 100 UNIT/ML IV SOLN
INTRAVENOUS | Status: AC
  Administered 2016-05-09: 500 [IU]
  Filled 2016-05-09: qty 5

## 2016-05-09 MED ORDER — IOPAMIDOL (ISOVUE-300) INJECTION 61%
INTRAVENOUS | Status: AC
  Administered 2016-05-09: 75 mL
  Filled 2016-05-09: qty 75

## 2016-05-09 NOTE — Progress Notes (Signed)
[facility] Summary Report / Loop Recorder 

## 2016-05-14 ENCOUNTER — Other Ambulatory Visit: Payer: Self-pay | Admitting: Medical Oncology

## 2016-05-14 DIAGNOSIS — D649 Anemia, unspecified: Secondary | ICD-10-CM

## 2016-05-14 DIAGNOSIS — C7931 Secondary malignant neoplasm of brain: Secondary | ICD-10-CM

## 2016-05-14 MED ORDER — INTEGRA PLUS PO CAPS: 1.0000 | ORAL_CAPSULE | Freq: Every morning | ORAL | 0 refills | Status: DC

## 2016-05-14 MED ORDER — ONDANSETRON HCL 8 MG PO TABS: ORAL_TABLET | ORAL | 2 refills | Status: DC

## 2016-05-16 ENCOUNTER — Other Ambulatory Visit: Payer: Self-pay | Admitting: Medical Oncology

## 2016-05-16 ENCOUNTER — Telehealth: Payer: Self-pay | Admitting: *Deleted

## 2016-05-16 NOTE — Telephone Encounter (Signed)
Pharmacy calling about this patient.  Call transferred to collaborative as requested by pharmacist.

## 2016-05-21 LAB — CUP PACEART REMOTE DEVICE CHECK
Date Time Interrogation Session: 20180503060828
Implantable Pulse Generator Implant Date: 20160412

## 2016-05-28 ENCOUNTER — Ambulatory Visit (HOSPITAL_BASED_OUTPATIENT_CLINIC_OR_DEPARTMENT_OTHER): Payer: Medicare Other | Admitting: Internal Medicine

## 2016-05-28 ENCOUNTER — Telehealth: Payer: Self-pay | Admitting: *Deleted

## 2016-05-28 ENCOUNTER — Other Ambulatory Visit (HOSPITAL_BASED_OUTPATIENT_CLINIC_OR_DEPARTMENT_OTHER): Payer: Medicare Other

## 2016-05-28 ENCOUNTER — Other Ambulatory Visit: Payer: Medicare Other

## 2016-05-28 ENCOUNTER — Telehealth: Payer: Self-pay | Admitting: Medical Oncology

## 2016-05-28 ENCOUNTER — Encounter: Payer: Self-pay | Admitting: Internal Medicine

## 2016-05-28 ENCOUNTER — Other Ambulatory Visit: Payer: Self-pay | Admitting: Medical Oncology

## 2016-05-28 ENCOUNTER — Ambulatory Visit (HOSPITAL_BASED_OUTPATIENT_CLINIC_OR_DEPARTMENT_OTHER): Payer: Medicare Other

## 2016-05-28 ENCOUNTER — Ambulatory Visit: Payer: Medicare Other | Admitting: Internal Medicine

## 2016-05-28 DIAGNOSIS — C3491 Malignant neoplasm of unspecified part of right bronchus or lung: Secondary | ICD-10-CM

## 2016-05-28 DIAGNOSIS — Z5111 Encounter for antineoplastic chemotherapy: Secondary | ICD-10-CM | POA: Diagnosis not present

## 2016-05-28 DIAGNOSIS — C3411 Malignant neoplasm of upper lobe, right bronchus or lung: Secondary | ICD-10-CM

## 2016-05-28 DIAGNOSIS — D473 Essential (hemorrhagic) thrombocythemia: Secondary | ICD-10-CM | POA: Diagnosis not present

## 2016-05-28 DIAGNOSIS — C7931 Secondary malignant neoplasm of brain: Secondary | ICD-10-CM | POA: Diagnosis not present

## 2016-05-28 DIAGNOSIS — R000 Tachycardia, unspecified: Secondary | ICD-10-CM | POA: Diagnosis not present

## 2016-05-28 DIAGNOSIS — F411 Generalized anxiety disorder: Secondary | ICD-10-CM

## 2016-05-28 DIAGNOSIS — Z91041 Radiographic dye allergy status: Secondary | ICD-10-CM

## 2016-05-28 LAB — CBC WITH DIFFERENTIAL/PLATELET
BASO%: 0.4 % (ref 0.0–2.0)
BASOS ABS: 0 10*3/uL (ref 0.0–0.1)
EOS ABS: 0.1 10*3/uL (ref 0.0–0.5)
EOS%: 0.6 % (ref 0.0–7.0)
HEMATOCRIT: 36.3 % (ref 34.8–46.6)
HEMOGLOBIN: 12.1 g/dL (ref 11.6–15.9)
LYMPH#: 1.5 10*3/uL (ref 0.9–3.3)
LYMPH%: 14.2 % (ref 14.0–49.7)
MCH: 31.4 pg (ref 25.1–34.0)
MCHC: 33.3 g/dL (ref 31.5–36.0)
MCV: 94.4 fL (ref 79.5–101.0)
MONO#: 0.9 10*3/uL (ref 0.1–0.9)
MONO%: 8.7 % (ref 0.0–14.0)
NEUT%: 76.1 % (ref 38.4–76.8)
NEUTROS ABS: 8 10*3/uL — AB (ref 1.5–6.5)
Platelets: 677 10*3/uL — ABNORMAL HIGH (ref 145–400)
RBC: 3.85 10*6/uL (ref 3.70–5.45)
RDW: 14.3 % (ref 11.2–14.5)
WBC: 10.6 10*3/uL — AB (ref 3.9–10.3)

## 2016-05-28 LAB — COMPREHENSIVE METABOLIC PANEL
ALBUMIN: 3 g/dL — AB (ref 3.5–5.0)
ALK PHOS: 84 U/L (ref 40–150)
ALT: 16 U/L (ref 0–55)
AST: 22 U/L (ref 5–34)
Anion Gap: 9 mEq/L (ref 3–11)
BILIRUBIN TOTAL: 0.41 mg/dL (ref 0.20–1.20)
BUN: 10.2 mg/dL (ref 7.0–26.0)
CALCIUM: 10.1 mg/dL (ref 8.4–10.4)
CO2: 27 mEq/L (ref 22–29)
Chloride: 107 mEq/L (ref 98–109)
Creatinine: 0.9 mg/dL (ref 0.6–1.1)
EGFR: 67 mL/min/{1.73_m2} — ABNORMAL LOW (ref >90)
GLUCOSE: 154 mg/dL — AB (ref 70–140)
Potassium: 4.5 mEq/L (ref 3.5–5.1)
Sodium: 142 mEq/L (ref 136–145)
TOTAL PROTEIN: 7.7 g/dL (ref 6.4–8.3)

## 2016-05-28 MED ORDER — ALPRAZOLAM 0.25 MG PO TABS: 0.2500 mg | ORAL_TABLET | Freq: Every evening | ORAL | 0 refills | Status: DC | PRN

## 2016-05-28 MED ORDER — SODIUM CHLORIDE 0.9 % IV SOLN
Freq: Once | INTRAVENOUS | Status: AC
  Administered 2016-05-28: 11:00:00 via INTRAVENOUS

## 2016-05-28 MED ORDER — ONDANSETRON HCL 8 MG PO TABS
ORAL_TABLET | ORAL | Status: AC
  Filled 2016-05-28: qty 1

## 2016-05-28 MED ORDER — SODIUM CHLORIDE 0.9 % IV SOLN
500.0000 mg/m2 | Freq: Once | INTRAVENOUS | Status: AC
  Administered 2016-05-28: 900 mg via INTRAVENOUS
  Filled 2016-05-28: qty 16

## 2016-05-28 MED ORDER — PREDNISONE 50 MG PO TABS: ORAL_TABLET | ORAL | 1 refills | Status: DC

## 2016-05-28 MED ORDER — ONDANSETRON HCL 8 MG PO TABS
8.0000 mg | ORAL_TABLET | Freq: Once | ORAL | Status: AC
  Administered 2016-05-28: 8 mg via ORAL

## 2016-05-28 MED ORDER — HEPARIN SOD (PORK) LOCK FLUSH 100 UNIT/ML IV SOLN
500.0000 [IU] | Freq: Once | INTRAVENOUS | Status: AC | PRN
  Administered 2016-05-28: 500 [IU]
  Filled 2016-05-28: qty 5

## 2016-05-28 MED ORDER — SODIUM CHLORIDE 0.9 % IJ SOLN
10.0000 mL | INTRAMUSCULAR | Status: DC | PRN
  Administered 2016-05-28: 10 mL
  Filled 2016-05-28: qty 10

## 2016-05-28 MED ORDER — DEXAMETHASONE SODIUM PHOSPHATE 10 MG/ML IJ SOLN
INTRAMUSCULAR | Status: AC
  Filled 2016-05-28: qty 1

## 2016-05-28 MED ORDER — DEXAMETHASONE SODIUM PHOSPHATE 10 MG/ML IJ SOLN
10.0000 mg | Freq: Once | INTRAMUSCULAR | Status: AC
  Administered 2016-05-28: 10 mg via INTRAVENOUS

## 2016-05-28 MED ORDER — TRIAMCINOLONE ACETONIDE 0.025 % EX OINT: 1.0000 "application " | TOPICAL_OINTMENT | Freq: Two times a day (BID) | CUTANEOUS | 0 refills | Status: DC

## 2016-05-28 NOTE — Progress Notes (Signed)
[place] Telephone:(336) [phone]   [phone]  OFFICE PROGRESS NOTE  [name], MD [place] [address]  DIAGNOSIS: Metastatic non-small cell lung cancer initially diagnosed as locally advanced stage IIIB with a right Pancoast tumor involving the vertebral body as well as prominent canal invasion with spinal cord compression in October 2007. The patient also has metastatic disease to the brain in April 2008.  PRIOR THERAPY: 1. Status post concurrent chemoradiation with weekly carboplatin and paclitaxel, last dose was given November 18, 2005. 2. 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 3. Status post gamma knife radiotherapy to a solitary brain lesion located in the superior frontal area of the brain at [facility] in April of 2008. 4. Status post palliative radiotherapy to the lateral abdominal wall metastatic lesion under the care of Dr. Lisbeth Renshaw, completed March of 2009. 5. Status post 6 cycles of systemic chemotherapy with carboplatin and Alimta. Last dose was given July 26, 2007 with disease stabilization. 6. Gamma knife stereotactic radiotherapy to 2 brain lesions one involving the right frontal dural based as well as right parietal lesion performed on 05/07/2012 under the care of Dr. Vallarie Mare at [facility].  CURRENT THERAPY: Maintenance systemic chemotherapy with Alimta 500 MG/M2 every 3 weeks, status post 151 cycles.  INTERVAL HISTORY: Danielle [name] 66 y.o. female returns to the clinic today for follow-up visit accompanied by her sister. The patient is feeling fine today with no specific complaints. She denied having any chest pain, shortness of breath, cough or hemoptysis. She has no nausea, vomiting, diarrhea or constipation. She denied having any fever or chills. She has no weight loss or night sweats. She is here today for evaluation  for starting cycle 152 of her maintenance treatment with Alimta.   MEDICAL HISTORY: Past Medical History:  Diagnosis Date  . Anemia   . 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 CVA (cerebral vascular accident) ([place]) 04/2014   pt states had 2 cva's within 2 wks  . DJD (degenerative joint disease), cervical   . Fibromyalgia   . H/O: pneumonia   . History of tobacco abuse quit 10/08   on nicotine patch  . Hypertension   . Hypokalemia   . Lung cancer ([place]) dx'd 09/2005   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 had chemo and radiation for lung ca  . Thrush 12/11/2010    ALLERGIES:  is allergic to contrast media [iodinated diagnostic agents]; iohexol; sulfa drugs cross reactors; and co-trimoxazole injection [sulfamethoxazole-trimethoprim].  MEDICATIONS:  Current Outpatient Prescriptions  Medication Sig Dispense Refill  . ALPRAZolam (XANAX) 0.25 MG tablet Take 1 tablet (0.25 mg total) by mouth at bedtime as needed. for sleep 30 tablet 0  . amLODipine (NORVASC) 10 MG tablet TAKE 1 TABLET (10 MG TOTAL) BY MOUTH DAILY. 90 tablet 1  . aspirin EC 325 MG tablet Take 1 tablet (325 mg total) by mouth daily. 90 tablet 0  . atorvastatin (LIPITOR) 40 MG tablet TAKE 1 TABLET BY MOUTH EVERY DAY AT 6 PM 90 tablet 3  . FeFum-FePoly-FA-B Cmp-C-Biot (INTEGRA PLUS) CAPS Take 1 capsule by mouth every morning. 90 capsule 0  . fluticasone (FLONASE) 50 MCG/ACT nasal spray Place 1-2 sprays into both nostrils daily as needed. 16 g 5  . folic acid (FOLVITE) 1 MG tablet TAKE 1 TABLET BY MOUTH DAILY. 90 tablet 1  . furosemide (LASIX) 20 MG  tablet     . lidocaine-prilocaine (EMLA) cream Apply to Port-A-Cath as directed 1-2 hours prior to chemotherapy 30 g 1  . lisinopril (PRINIVIL,ZESTRIL) 20 MG tablet TAKE 2 TABLETS (40 MG TOTAL) BY MOUTH DAILY. 180 tablet 1  . morphine (MS CONTIN) 60 MG 12 hr tablet Take 1 tablet (60 mg total) by mouth 2 (two) times daily. 60 tablet 0  . morphine (MSIR) 30 MG  tablet Take 1 tablet (30 mg total) by mouth every 4 (four) hours as needed (breakthrough pain). 60 tablet 0  . Olopatadine HCl 0.2 % SOLN Apply 1 drop to eye daily. 2.5 mL 11  . ondansetron (ZOFRAN) 8 MG tablet TAKE 1 TABLET (8 MG TOTAL) BY MOUTH 2 (TWO) TIMES DAILY AS NEEDED FOR NAUSEA OR VOMITING. 30 tablet 2  . diphenhydrAMINE (BENADRYL) 25 MG tablet Take 1 tablet (25 mg total) by mouth once. 2 hours before CT scan on 09/18/2015 10 tablet 0  . predniSONE (DELTASONE) 50 MG tablet 1 tablet by mouth 13 hours ,7 hours and 1 hour  before CT scan 3 tablet 1   No current facility-administered medications for this visit.     SURGICAL HISTORY:  Past Surgical History:  Procedure Laterality Date  . CERVICAL LAMINECTOMY  1995  . KNEE SURGERY  1990   Left x 2  . 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 LOOP RECORDER IMPLANT N/A 04/19/2014   Procedure: LOOP RECORDER IMPLANT;  Surgeon: Thompson Grayer, MD;  Location: [facility] CATH LAB;  Service: Cardiovascular;  Laterality: N/A;  . OTHER SURGICAL HISTORY  2008   Gamma knife surgery to remove brain met   . PORTACATH PLACEMENT  -[name]   TIP IN CAVOATRIAL JUNCTION  . TEE WITHOUT CARDIOVERSION N/A 04/19/2014   Procedure: TRANSESOPHAGEAL ECHOCARDIOGRAM (TEE);  Surgeon: Larey Dresser, MD;  Location: [facility] ENDOSCOPY;  Service: Cardiovascular;  Laterality: N/A;    REVIEW OF SYSTEMS:  A comprehensive review of systems was negative.   PHYSICAL EXAMINATION: General appearance: alert, cooperative and no distress Head: Normocephalic, without obvious abnormality, atraumatic Neck: no adenopathy, no JVD, supple, symmetrical, trachea midline and thyroid not enlarged, symmetric, no tenderness/mass/nodules Lymph nodes: Cervical, supraclavicular, and axillary nodes normal. Resp: clear to auscultation bilaterally Back: symmetric, no curvature. ROM normal. No CVA tenderness. Cardio: regular rate and rhythm, S1, S2 normal, no murmur, click, rub or  gallop GI: soft, non-tender; bowel sounds normal; no masses,  no organomegaly Extremities: extremities normal, atraumatic, no cyanosis or edema  ECOG PERFORMANCE STATUS: 0 - Asymptomatic  Blood pressure (!) 144/74, pulse (!) 116, temperature 98.2 F (36.8 C), temperature source Oral, resp. rate 18, height _0  (1.6 m), weight 129 lb 12.8 oz (58.9 kg), SpO2 95 %.  LABORATORY DATA: Lab Results  Component Value Date   WBC 10.6 (H) 05/28/2016   HGB 12.1 05/28/2016   HCT 36.3 05/28/2016   MCV 94.4 05/28/2016   PLT 677 (H) 05/28/2016      Chemistry      Component Value Date/Time   NA 143 05/07/2016 0851   K 4.0 05/07/2016 0851   CL 103 01/26/2015 0818   CL 106 06/30/2012 1004   CO2 31 (H) 05/07/2016 0851   BUN 16.5 05/07/2016 0851   CREATININE 1.1 05/07/2016 0851      Component Value Date/Time   CALCIUM 10.5 (H) 05/07/2016 0851   ALKPHOS 94 05/07/2016 0851   AST 18 05/07/2016 0851   ALT 13 05/07/2016  0851   BILITOT 0.59 05/07/2016 0851       RADIOGRAPHIC STUDIES: Ct Chest W Contrast  Result Date: 05/09/2016 CLINICAL DATA:  Lung cancer EXAM: CT CHEST WITH CONTRAST TECHNIQUE: Multidetector CT imaging of the chest was performed during intravenous contrast administration. CONTRAST:  <See Chart> ISOVUE-300 IOPAMIDOL (ISOVUE-300) INJECTION 61% COMPARISON:  01/19/2016 FINDINGS: Cardiovascular: The heart size is normal. No pericardial effusion. Atherosclerotic calcification is noted in the wall of the thoracic aorta. Coronary artery calcification is noted. Right Port-A-Cath tip positioned in the upper right atrium. Mediastinum/Nodes: No mediastinal lymphadenopathy. There is no hilar lymphadenopathy. The esophagus has normal imaging features. There is no axillary lymphadenopathy. Lungs/Pleura: Centrilobular and paraseptal emphysema again noted bilaterally. The anterior right upper lobe subpleural irregular nodule seen previously at 8 x 8 mm is stable. Fluid and debris is seen in the lobar  bronchus to the left lower lobe. There is impacted segmental and subsegmental bronchi of both lower lobes, left greater than right. Despite the airway impaction, no substantial bronchiectasis evident. No pulmonary edema or pleural effusion. Upper Abdomen: Unremarkable. Musculoskeletal: Vertebral augmentation noted at T7 and T4. Compression deformity at T3 is stable. IMPRESSION: 1. Interval development of left lobar and bilateral segmental/subsegmental airway impaction to the lower lobes, left greater than right. Infection could cause this appearance and in the appropriate clinical setting, aspiration would be a consideration. 2. No suspicious findings suggest recurrent neoplasm. 3. Emphysema. Electronically Signed   By: Misty Stanley M.D.   On: 05/09/2016 11:17   Ct Abdomen Pelvis W Contrast  Result Date: 05/03/2016 CLINICAL DATA:  Lung cancer with omental, spine and brain metastasis. EXAM: CT ABDOMEN AND PELVIS WITH CONTRAST TECHNIQUE: Multidetector CT imaging of the abdomen and pelvis was performed using the standard protocol following bolus administration of intravenous contrast. CONTRAST:  112m ISOVUE-300 IOPAMIDOL (ISOVUE-300) INJECTION 61% COMPARISON:  CT chest abdomen  pelvis 01/19/2016 FINDINGS: Lower chest: Lung bases are clear. Hepatobiliary: No focal hepatic lesion. No biliary duct dilatation. Gallbladder is normal. Common bile duct is normal. Pancreas: Pancreas is normal. No ductal dilatation. No pancreatic inflammation. Spleen: Normal spleen Adrenals/urinary tract: Adrenal glands and kidneys are normal. The ureters and bladder normal. Stomach/Bowel: Stomach, small bowel, appendix, and cecum are normal. The ascending, transverse descending colon normal. Sigmoid colon normal. There is asymmetric thickening at the anorectal junction on the RIGHT measuring 14 mm (image 78, series 2.) Vascular/Lymphatic: Abdominal aorta is normal caliber with atherosclerotic calcification. There is no retroperitoneal or  periportal lymphadenopathy. No pelvic lymphadenopathy. Reproductive: Uterus and ovaries normal. Other: No of mental thickening.  No peritoneal metastasis. Musculoskeletal: No aggressive osseous lesion. IMPRESSION: 1. No evidence of metastatic carcinoma in the abdomen pelvis. 2. Asymmetric thickening of the posterior RIGHT anorectal junction. Recommend physical exam to evaluate for true lesion versus pseudo lesion. Electronically Signed   By: SSuzy BouchardM.D.   On: 05/03/2016 10:57    ASSESSMENT AND PLAN:  this is a very pleasant 66years old white female with metastatic non-small cell lung cancer diagnosed in October 2007 status post concurrent chemoradiation followed by consolidation chemotherapy She is currently on maintenance treatment with single agent Alimta status post 151 cycles. She is tolerating her treatment well with no significant adverse effects. I recommended for her to proceed with cycle #152 today as scheduled. For the tachycardia, I recommended for the patient to increase oral intake of fluid. She was rushing today to come to the clinic. I will see her back for follow-up visit in 3 weeks  for reevaluation before the next cycle of her treatment. Her thrombocytosis is likely secondary to recent high-dose of steroids for premedication for the scans. We will monitor closely for now. The patient was given a refill for Xanax. She was advised to call immediately if she has any concerning symptoms in the interval. The patient voices understanding of current disease status and treatment options and is in agreement with the current care plan. All questions were answered. The patient knows to call the clinic with any problems, questions or concerns. We can certainly see the patient much sooner if necessary. I spent 10 minutes counseling the patient face to face. The total time spent in the appointment was 15 minutes.  Disclaimer: This note was dictated with voice recognition software.  Similar sounding words can inadvertently be transcribed and may not be corrected upon review.

## 2016-05-28 NOTE — Telephone Encounter (Signed)
Asked pt to come in early today -she is coming

## 2016-05-28 NOTE — Patient Instructions (Signed)
[facility] Discharge Instructions for Patients Receiving Chemotherapy  Today you received the following chemotherapy agents; Alimta.   To help prevent nausea and vomiting after your treatment, we encourage you to take your nausea medication as directed.    If you develop nausea and vomiting that is not controlled by your nausea medication, call the clinic.   BELOW ARE SYMPTOMS THAT SHOULD BE REPORTED IMMEDIATELY:  *FEVER GREATER THAN 100.5 F  *CHILLS WITH OR WITHOUT FEVER  NAUSEA AND VOMITING THAT IS NOT CONTROLLED WITH YOUR NAUSEA MEDICATION  *UNUSUAL SHORTNESS OF BREATH  *UNUSUAL BRUISING OR BLEEDING  TENDERNESS IN MOUTH AND THROAT WITH OR WITHOUT PRESENCE OF ULCERS  *URINARY PROBLEMS  *BOWEL PROBLEMS  UNUSUAL RASH Items with * indicate a potential emergency and should be followed up as soon as possible.  Feel free to call the clinic you have any questions or concerns. The clinic phone number is [phone].  Please show the CHEMO ALERT CARD at check-in to the Emergency Department and triage nurse.   

## 2016-05-29 ENCOUNTER — Telehealth: Payer: Self-pay | Admitting: Internal Medicine

## 2016-05-29 DIAGNOSIS — H5709 Other anomalies of pupillary function: Secondary | ICD-10-CM | POA: Diagnosis not present

## 2016-05-29 DIAGNOSIS — H524 Presbyopia: Secondary | ICD-10-CM | POA: Diagnosis not present

## 2016-05-29 DIAGNOSIS — H25813 Combined forms of age-related cataract, bilateral: Secondary | ICD-10-CM | POA: Diagnosis not present

## 2016-05-29 DIAGNOSIS — H04123 Dry eye syndrome of bilateral lacrimal glands: Secondary | ICD-10-CM | POA: Diagnosis not present

## 2016-05-29 NOTE — Telephone Encounter (Signed)
Scheduled appts per 5/22 los - patient aware of additional appts and will pick up new schedule next visit.

## 2016-06-10 ENCOUNTER — Ambulatory Visit (INDEPENDENT_AMBULATORY_CARE_PROVIDER_SITE_OTHER): Payer: Medicare Other | Admitting: *Deleted

## 2016-06-10 DIAGNOSIS — I639 Cerebral infarction, unspecified: Secondary | ICD-10-CM | POA: Diagnosis not present

## 2016-06-10 NOTE — Progress Notes (Signed)
[facility] Summary Report / Loop Recorder 

## 2016-06-12 ENCOUNTER — Encounter: Payer: Self-pay | Admitting: Cardiology

## 2016-06-12 LAB — CUP PACEART REMOTE DEVICE CHECK
Date Time Interrogation Session: 20180602060545
MDC IDC PG IMPLANT DT: 20160412

## 2016-06-18 ENCOUNTER — Ambulatory Visit (HOSPITAL_BASED_OUTPATIENT_CLINIC_OR_DEPARTMENT_OTHER): Payer: Medicare Other

## 2016-06-18 ENCOUNTER — Other Ambulatory Visit: Payer: Self-pay | Admitting: Medical Oncology

## 2016-06-18 ENCOUNTER — Encounter: Payer: Self-pay | Admitting: Internal Medicine

## 2016-06-18 ENCOUNTER — Other Ambulatory Visit (HOSPITAL_BASED_OUTPATIENT_CLINIC_OR_DEPARTMENT_OTHER): Payer: Medicare Other

## 2016-06-18 ENCOUNTER — Telehealth: Payer: Self-pay | Admitting: Internal Medicine

## 2016-06-18 ENCOUNTER — Ambulatory Visit (HOSPITAL_BASED_OUTPATIENT_CLINIC_OR_DEPARTMENT_OTHER): Payer: Medicare Other | Admitting: Internal Medicine

## 2016-06-18 DIAGNOSIS — C3491 Malignant neoplasm of unspecified part of right bronchus or lung: Secondary | ICD-10-CM | POA: Diagnosis not present

## 2016-06-18 DIAGNOSIS — C7931 Secondary malignant neoplasm of brain: Secondary | ICD-10-CM | POA: Diagnosis not present

## 2016-06-18 DIAGNOSIS — C3411 Malignant neoplasm of upper lobe, right bronchus or lung: Secondary | ICD-10-CM

## 2016-06-18 DIAGNOSIS — Z5111 Encounter for antineoplastic chemotherapy: Secondary | ICD-10-CM

## 2016-06-18 DIAGNOSIS — C3410 Malignant neoplasm of upper lobe, unspecified bronchus or lung: Secondary | ICD-10-CM

## 2016-06-18 DIAGNOSIS — C3490 Malignant neoplasm of unspecified part of unspecified bronchus or lung: Secondary | ICD-10-CM

## 2016-06-18 LAB — COMPREHENSIVE METABOLIC PANEL
ALT: 14 U/L (ref 0–55)
AST: 25 U/L (ref 5–34)
Albumin: 3.2 g/dL — ABNORMAL LOW (ref 3.5–5.0)
Alkaline Phosphatase: 83 U/L (ref 40–150)
Anion Gap: 8 mEq/L (ref 3–11)
BILIRUBIN TOTAL: 0.35 mg/dL (ref 0.20–1.20)
BUN: 9.3 mg/dL (ref 7.0–26.0)
CO2: 27 meq/L (ref 22–29)
Calcium: 9.7 mg/dL (ref 8.4–10.4)
Chloride: 109 mEq/L (ref 98–109)
Creatinine: 0.8 mg/dL (ref 0.6–1.1)
EGFR: 74 mL/min/{1.73_m2} — AB (ref >90)
GLUCOSE: 111 mg/dL (ref 70–140)
Potassium: 4.7 mEq/L (ref 3.5–5.1)
SODIUM: 143 meq/L (ref 136–145)
Total Protein: 6.9 g/dL (ref 6.4–8.3)

## 2016-06-18 LAB — CBC WITH DIFFERENTIAL/PLATELET
BASO%: 0.8 % (ref 0.0–2.0)
Basophils Absolute: 0.1 10*3/uL (ref 0.0–0.1)
EOS%: 1.1 % (ref 0.0–7.0)
Eosinophils Absolute: 0.1 10*3/uL (ref 0.0–0.5)
HCT: 37.3 % (ref 34.8–46.6)
HGB: 12.5 g/dL (ref 11.6–15.9)
LYMPH%: 23.4 % (ref 14.0–49.7)
MCH: 31.9 pg (ref 25.1–34.0)
MCHC: 33.4 g/dL (ref 31.5–36.0)
MCV: 95.7 fL (ref 79.5–101.0)
MONO#: 0.9 10*3/uL (ref 0.1–0.9)
MONO%: 10.6 % (ref 0.0–14.0)
NEUT%: 64.1 % (ref 38.4–76.8)
NEUTROS ABS: 5.5 10*3/uL (ref 1.5–6.5)
Platelets: 377 10*3/uL (ref 145–400)
RBC: 3.9 10*6/uL (ref 3.70–5.45)
RDW: 16.3 % — ABNORMAL HIGH (ref 11.2–14.5)
WBC: 8.6 10*3/uL (ref 3.9–10.3)
lymph#: 2 10*3/uL (ref 0.9–3.3)

## 2016-06-18 MED ORDER — MORPHINE SULFATE 30 MG PO TABS: 30.0000 mg | ORAL_TABLET | ORAL | 0 refills | Status: DC | PRN

## 2016-06-18 MED ORDER — DEXAMETHASONE SODIUM PHOSPHATE 10 MG/ML IJ SOLN
10.0000 mg | Freq: Once | INTRAMUSCULAR | Status: AC
  Administered 2016-06-18: 10 mg via INTRAVENOUS

## 2016-06-18 MED ORDER — ONDANSETRON HCL 8 MG PO TABS
ORAL_TABLET | ORAL | Status: AC
  Filled 2016-06-18: qty 1

## 2016-06-18 MED ORDER — MORPHINE SULFATE ER 60 MG PO TBCR: 60.0000 mg | EXTENDED_RELEASE_TABLET | Freq: Two times a day (BID) | ORAL | 0 refills | Status: DC

## 2016-06-18 MED ORDER — HEPARIN SOD (PORK) LOCK FLUSH 100 UNIT/ML IV SOLN
500.0000 [IU] | Freq: Once | INTRAVENOUS | Status: AC | PRN
  Administered 2016-06-18: 500 [IU]
  Filled 2016-06-18: qty 5

## 2016-06-18 MED ORDER — SODIUM CHLORIDE 0.9 % IV SOLN
Freq: Once | INTRAVENOUS | Status: AC
  Administered 2016-06-18: 11:00:00 via INTRAVENOUS

## 2016-06-18 MED ORDER — CYANOCOBALAMIN 1000 MCG/ML IJ SOLN
INTRAMUSCULAR | Status: AC
  Filled 2016-06-18: qty 1

## 2016-06-18 MED ORDER — SODIUM CHLORIDE 0.9 % IJ SOLN
10.0000 mL | INTRAMUSCULAR | Status: DC | PRN
Start: 2016-06-18 — End: 2016-06-18
  Administered 2016-06-18: 10 mL
  Filled 2016-06-18: qty 10

## 2016-06-18 MED ORDER — ONDANSETRON HCL 8 MG PO TABS
8.0000 mg | ORAL_TABLET | Freq: Once | ORAL | Status: AC
  Administered 2016-06-18: 8 mg via ORAL

## 2016-06-18 MED ORDER — CYANOCOBALAMIN 1000 MCG/ML IJ SOLN
1000.0000 ug | Freq: Once | INTRAMUSCULAR | Status: AC
  Administered 2016-06-18: 1000 ug via INTRAMUSCULAR

## 2016-06-18 MED ORDER — PEMETREXED DISODIUM CHEMO INJECTION 500 MG
500.0000 mg/m2 | Freq: Once | INTRAVENOUS | Status: AC
  Administered 2016-06-18: 900 mg via INTRAVENOUS
  Filled 2016-06-18: qty 20

## 2016-06-18 MED ORDER — ASPIRIN EC 325 MG PO TBEC: 325.0000 mg | DELAYED_RELEASE_TABLET | Freq: Every day | ORAL | 0 refills | Status: DC

## 2016-06-18 MED ORDER — DEXAMETHASONE SODIUM PHOSPHATE 10 MG/ML IJ SOLN
INTRAMUSCULAR | Status: AC
  Filled 2016-06-18: qty 1

## 2016-06-18 NOTE — Patient Instructions (Signed)
[facility] Discharge Instructions for Patients Receiving Chemotherapy  Today you received the following chemotherapy agents Alimta.  To help prevent nausea and vomiting after your treatment, we encourage you to take your nausea medication.   If you develop nausea and vomiting that is not controlled by your nausea medication, call the clinic.   BELOW ARE SYMPTOMS THAT SHOULD BE REPORTED IMMEDIATELY:  *FEVER GREATER THAN 100.5 F  *CHILLS WITH OR WITHOUT FEVER  NAUSEA AND VOMITING THAT IS NOT CONTROLLED WITH YOUR NAUSEA MEDICATION  *UNUSUAL SHORTNESS OF BREATH  *UNUSUAL BRUISING OR BLEEDING  TENDERNESS IN MOUTH AND THROAT WITH OR WITHOUT PRESENCE OF ULCERS  *URINARY PROBLEMS  *BOWEL PROBLEMS  UNUSUAL RASH Items with * indicate a potential emergency and should be followed up as soon as possible.  Feel free to call the clinic you have any questions or concerns. The clinic phone number is [phone].  Please show the [place] at check-in to the Emergency Department and triage nurse.

## 2016-06-18 NOTE — Progress Notes (Signed)
[place] Telephone:([phone]   Fax:(336) [phone]  OFFICE PROGRESS NOTE  [name], MD [place] [address]  DIAGNOSIS: Metastatic non-small cell lung cancer initially diagnosed as locally advanced stage IIIB with a right Pancoast tumor involving the vertebral body as well as prominent canal invasion with spinal cord compression in October 2007. The patient also has metastatic disease to the brain in April 2008.  PRIOR THERAPY: 1. Status post concurrent chemoradiation with weekly carboplatin and paclitaxel, last dose was given November 18, 2005. 2. 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 3. Status post gamma knife radiotherapy to a solitary brain lesion located in the superior frontal area of the brain at [facility] in April of 2008. 4. Status post palliative radiotherapy to the lateral abdominal wall metastatic lesion under the care of Dr. Lisbeth Renshaw, completed March of 2009. 5. Status post 6 cycles of systemic chemotherapy with carboplatin and Alimta. Last dose was given July 26, 2007 with disease stabilization. 6. Gamma knife stereotactic radiotherapy to 2 brain lesions one involving the right frontal dural based as well as right parietal lesion performed on 05/07/2012 under the care of Dr. Vallarie Mare at [facility].  CURRENT THERAPY: Maintenance systemic chemotherapy with Alimta 500 MG/M2 every 3 weeks, status post 152 cycles.  INTERVAL HISTORY: Danielle [name] 66 y.o. female returns to the clinic today for follow-up visit. The patient has no complaints today except for occasional swelling of the lower extremities and she takes Lasix on as-needed basis. She denied having any current chest pain, shortness of breath, cough or hemoptysis. She denied having any fever or chills. She has no nausea, vomiting, diarrhea or constipation. She continues to  tolerate her maintenance treatment with Alimta fairly well. She is here for evaluation before starting cycle #153.   MEDICAL HISTORY: Past Medical History:  Diagnosis Date  . Anemia   . 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 CVA (cerebral vascular accident) ([place]) 04/2014   pt states had 2 cva's within 2 wks  . DJD (degenerative joint disease), cervical   . Fibromyalgia   . H/O: pneumonia   . History of tobacco abuse quit 10/08   on nicotine patch  . Hypertension   . Hypokalemia   . Lung cancer ([place]) dx'd 09/2005   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 had chemo and radiation for lung ca  . Thrush 12/11/2010    ALLERGIES:  is allergic to contrast media [iodinated diagnostic agents]; iohexol; sulfa drugs cross reactors; and co-trimoxazole injection [sulfamethoxazole-trimethoprim].  MEDICATIONS:  Current Outpatient Prescriptions  Medication Sig Dispense Refill  . ALPRAZolam (XANAX) 0.25 MG tablet Take 1 tablet (0.25 mg total) by mouth at bedtime as needed. for sleep 30 tablet 0  . amLODipine (NORVASC) 10 MG tablet TAKE 1 TABLET (10 MG TOTAL) BY MOUTH DAILY. 90 tablet 1  . aspirin EC 325 MG tablet Take 1 tablet (325 mg total) by mouth daily. 90 tablet 0  . atorvastatin (LIPITOR) 40 MG tablet TAKE 1 TABLET BY MOUTH EVERY DAY AT 6 PM 90 tablet 3  . FeFum-FePoly-FA-B Cmp-C-Biot (INTEGRA PLUS) CAPS Take 1 capsule by mouth every morning. 90 capsule 0  . fluticasone (FLONASE) 50 MCG/ACT nasal spray Place 1-2 sprays into both nostrils daily as needed. 16 g 5  . folic acid (FOLVITE) 1 MG tablet TAKE 1 TABLET BY MOUTH DAILY. 90 tablet 1  .  furosemide (LASIX) 20 MG tablet     . lidocaine-prilocaine (EMLA) cream Apply to Port-A-Cath as directed 1-2 hours prior to chemotherapy 30 g 1  . lisinopril (PRINIVIL,ZESTRIL) 20 MG tablet TAKE 2 TABLETS (40 MG TOTAL) BY MOUTH DAILY. 180 tablet 1  . morphine (MS CONTIN) 60 MG 12 hr tablet Take 1 tablet (60 mg total) by mouth 2 (two)  times daily. 60 tablet 0  . morphine (MSIR) 30 MG tablet Take 1 tablet (30 mg total) by mouth every 4 (four) hours as needed (breakthrough pain). 60 tablet 0  . Olopatadine HCl 0.2 % SOLN Apply 1 drop to eye daily. 2.5 mL 11  . ondansetron (ZOFRAN) 8 MG tablet TAKE 1 TABLET (8 MG TOTAL) BY MOUTH 2 (TWO) TIMES DAILY AS NEEDED FOR NAUSEA OR VOMITING. 30 tablet 2  . predniSONE (DELTASONE) 50 MG tablet 1 tablet by mouth 13 hours ,7 hours and 1 hour  before CT scan 3 tablet 1  . triamcinolone (KENALOG) 0.025 % ointment Apply 1 application topically 2 (two) times daily. 30 g 0  . diphenhydrAMINE (BENADRYL) 25 MG tablet Take 1 tablet (25 mg total) by mouth once. 2 hours before CT scan on 09/18/2015 10 tablet 0   No current facility-administered medications for this visit.     SURGICAL HISTORY:  Past Surgical History:  Procedure Laterality Date  . CERVICAL LAMINECTOMY  1995  . KNEE SURGERY  1990   Left x 2  . 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 LOOP RECORDER IMPLANT N/A 04/19/2014   Procedure: LOOP RECORDER IMPLANT;  Surgeon: Thompson Grayer, MD;  Location: [facility] CATH LAB;  Service: Cardiovascular;  Laterality: N/A;  . OTHER SURGICAL HISTORY  2008   Gamma knife surgery to remove brain met   . PORTACATH PLACEMENT  -[name]   TIP IN CAVOATRIAL JUNCTION  . TEE WITHOUT CARDIOVERSION N/A 04/19/2014   Procedure: TRANSESOPHAGEAL ECHOCARDIOGRAM (TEE);  Surgeon: Larey Dresser, MD;  Location: [facility] ENDOSCOPY;  Service: Cardiovascular;  Laterality: N/A;    REVIEW OF SYSTEMS:  A comprehensive review of systems was negative.   PHYSICAL EXAMINATION: General appearance: alert, cooperative and no distress Head: Normocephalic, without obvious abnormality, atraumatic Neck: no adenopathy, no JVD, supple, symmetrical, trachea midline and thyroid not enlarged, symmetric, no tenderness/mass/nodules Lymph nodes: Cervical, supraclavicular, and axillary nodes normal. Resp: clear to  auscultation bilaterally Back: symmetric, no curvature. ROM normal. No CVA tenderness. Cardio: regular rate and rhythm, S1, S2 normal, no murmur, click, rub or gallop GI: soft, non-tender; bowel sounds normal; no masses,  no organomegaly Extremities: extremities normal, atraumatic, no cyanosis or edema  ECOG PERFORMANCE STATUS: 0 - Asymptomatic  Blood pressure (!) 162/71, pulse 98, temperature 98 F (36.7 C), temperature source Oral, resp. rate 18, height 5' 3" (1.6 m), weight 134 lb 12.8 oz (61.1 kg).  LABORATORY DATA: Lab Results  Component Value Date   WBC 8.6 06/18/2016   HGB 12.5 06/18/2016   HCT 37.3 06/18/2016   MCV 95.7 06/18/2016   PLT 377 06/18/2016      Chemistry      Component Value Date/Time   NA 143 06/18/2016 0923   K 4.7 06/18/2016 0923   CL 103 01/26/2015 0818   CL 106 06/30/2012 1004   CO2 27 06/18/2016 0923   BUN 9.3 06/18/2016 0923   CREATININE 0.8 06/18/2016 0923      Component Value Date/Time   CALCIUM 9.7 06/18/2016 0923   ALKPHOS  83 06/18/2016 0923   AST 25 06/18/2016 0923   ALT 14 06/18/2016 0923   BILITOT 0.35 06/18/2016 0923       RADIOGRAPHIC STUDIES: No results found.  ASSESSMENT AND PLAN: This is a very pleasant 66 years old white female with metastatic non-small cell lung cancer diagnosed in October 2007 status post concurrent chemoradiation followed by consolidation chemotherapy and she is currently on maintenance treatment with single agent Alimta status post 152 cycles. She has been tolerating this treatment well. I recommended for her to proceed with cycle 153 today as a scheduled. For pain management, she was given refills for MS Contin as well as morphine sulfate. The patient would come back for follow-up visit in 3 weeks for reevaluation before starting the next cycle of her treatment. She was advised to call immediately if she has any concerning symptoms in the interval. The patient voices understanding of current disease  status and treatment options and is in agreement with the current care plan. All questions were answered. The patient knows to call the clinic with any problems, questions or concerns. We can certainly see the patient much sooner if necessary. I spent 10 minutes counseling the patient face to face. The total time spent in the appointment was 15 minutes.  Disclaimer: This note was dictated with voice recognition software. Similar sounding words can inadvertently be transcribed and may not be corrected upon review.

## 2016-06-18 NOTE — Telephone Encounter (Signed)
Scheduled appt per 6/12 los - Patient called to treatment area before appts scheduled - patient aware to get new schedule in the treatment area.

## 2016-06-26 ENCOUNTER — Other Ambulatory Visit: Payer: Self-pay | Admitting: *Deleted

## 2016-06-26 DIAGNOSIS — F411 Generalized anxiety disorder: Secondary | ICD-10-CM

## 2016-06-26 MED ORDER — ALPRAZOLAM 0.25 MG PO TABS: 0.2500 mg | ORAL_TABLET | Freq: Every evening | ORAL | 0 refills | Status: DC | PRN

## 2016-06-26 NOTE — Telephone Encounter (Signed)
rx for xanax called into [facility] per pt request

## 2016-07-04 ENCOUNTER — Encounter: Payer: Self-pay | Admitting: Cardiology

## 2016-07-08 ENCOUNTER — Encounter: Payer: Medicare Other | Admitting: *Deleted

## 2016-07-09 ENCOUNTER — Other Ambulatory Visit (HOSPITAL_BASED_OUTPATIENT_CLINIC_OR_DEPARTMENT_OTHER): Payer: Medicare Other

## 2016-07-09 ENCOUNTER — Telehealth: Payer: Self-pay | Admitting: Internal Medicine

## 2016-07-09 ENCOUNTER — Ambulatory Visit (HOSPITAL_BASED_OUTPATIENT_CLINIC_OR_DEPARTMENT_OTHER): Payer: Medicare Other | Admitting: Internal Medicine

## 2016-07-09 ENCOUNTER — Ambulatory Visit (HOSPITAL_BASED_OUTPATIENT_CLINIC_OR_DEPARTMENT_OTHER): Payer: Medicare Other

## 2016-07-09 ENCOUNTER — Encounter: Payer: Self-pay | Admitting: Internal Medicine

## 2016-07-09 VITALS — BP 127/57 | HR 71 | Temp 97.6°F | Resp 18 | Ht 63.0 in | Wt 135.6 lb

## 2016-07-09 DIAGNOSIS — C3411 Malignant neoplasm of upper lobe, right bronchus or lung: Secondary | ICD-10-CM

## 2016-07-09 DIAGNOSIS — Z5111 Encounter for antineoplastic chemotherapy: Secondary | ICD-10-CM | POA: Diagnosis not present

## 2016-07-09 DIAGNOSIS — C3491 Malignant neoplasm of unspecified part of right bronchus or lung: Secondary | ICD-10-CM

## 2016-07-09 DIAGNOSIS — C7931 Secondary malignant neoplasm of brain: Secondary | ICD-10-CM | POA: Diagnosis not present

## 2016-07-09 DIAGNOSIS — C3490 Malignant neoplasm of unspecified part of unspecified bronchus or lung: Secondary | ICD-10-CM

## 2016-07-09 LAB — CBC WITH DIFFERENTIAL/PLATELET
BASO%: 0.4 % (ref 0.0–2.0)
Basophils Absolute: 0 10*3/uL (ref 0.0–0.1)
EOS ABS: 0.2 10*3/uL (ref 0.0–0.5)
EOS%: 2.4 % (ref 0.0–7.0)
HCT: 35.3 % (ref 34.8–46.6)
HGB: 11.9 g/dL (ref 11.6–15.9)
LYMPH%: 23.6 % (ref 14.0–49.7)
MCH: 32.6 pg (ref 25.1–34.0)
MCHC: 33.9 g/dL (ref 31.5–36.0)
MCV: 96.2 fL (ref 79.5–101.0)
MONO#: 0.9 10*3/uL (ref 0.1–0.9)
MONO%: 10.6 % (ref 0.0–14.0)
NEUT%: 63 % (ref 38.4–76.8)
NEUTROS ABS: 5.1 10*3/uL (ref 1.5–6.5)
PLATELETS: 345 10*3/uL (ref 145–400)
RBC: 3.66 10*6/uL — AB (ref 3.70–5.45)
RDW: 15.7 % — ABNORMAL HIGH (ref 11.2–14.5)
WBC: 8.1 10*3/uL (ref 3.9–10.3)
lymph#: 1.9 10*3/uL (ref 0.9–3.3)

## 2016-07-09 LAB — COMPREHENSIVE METABOLIC PANEL
ALK PHOS: 84 U/L (ref 40–150)
ALT: 11 U/L (ref 0–55)
ANION GAP: 7 meq/L (ref 3–11)
AST: 24 U/L (ref 5–34)
Albumin: 3.2 g/dL — ABNORMAL LOW (ref 3.5–5.0)
BILIRUBIN TOTAL: 0.38 mg/dL (ref 0.20–1.20)
BUN: 15.8 mg/dL (ref 7.0–26.0)
CALCIUM: 9.7 mg/dL (ref 8.4–10.4)
CO2: 28 meq/L (ref 22–29)
CREATININE: 1.2 mg/dL — AB (ref 0.6–1.1)
Chloride: 104 mEq/L (ref 98–109)
EGFR: 50 mL/min/{1.73_m2} — ABNORMAL LOW (ref >90)
Glucose: 129 mg/dl (ref 70–140)
Potassium: 4.3 mEq/L (ref 3.5–5.1)
Sodium: 139 mEq/L (ref 136–145)
TOTAL PROTEIN: 6.9 g/dL (ref 6.4–8.3)

## 2016-07-09 MED ORDER — ONDANSETRON HCL 8 MG PO TABS
8.0000 mg | ORAL_TABLET | Freq: Once | ORAL | Status: AC
  Administered 2016-07-09: 8 mg via ORAL

## 2016-07-09 MED ORDER — SODIUM CHLORIDE 0.9 % IJ SOLN
10.0000 mL | INTRAMUSCULAR | Status: DC | PRN
  Administered 2016-07-09: 10 mL
  Filled 2016-07-09: qty 10

## 2016-07-09 MED ORDER — ONDANSETRON HCL 8 MG PO TABS
ORAL_TABLET | ORAL | Status: AC
  Filled 2016-07-09: qty 1

## 2016-07-09 MED ORDER — SODIUM CHLORIDE 0.9 % IV SOLN
500.0000 mg/m2 | Freq: Once | INTRAVENOUS | Status: AC
  Administered 2016-07-09: 900 mg via INTRAVENOUS
  Filled 2016-07-09: qty 20

## 2016-07-09 MED ORDER — SODIUM CHLORIDE 0.9 % IV SOLN
Freq: Once | INTRAVENOUS | Status: AC
  Administered 2016-07-09: 10:00:00 via INTRAVENOUS

## 2016-07-09 MED ORDER — HEPARIN SOD (PORK) LOCK FLUSH 100 UNIT/ML IV SOLN
500.0000 [IU] | Freq: Once | INTRAVENOUS | Status: AC | PRN
  Administered 2016-07-09: 500 [IU]
  Filled 2016-07-09: qty 5

## 2016-07-09 MED ORDER — DEXAMETHASONE SODIUM PHOSPHATE 10 MG/ML IJ SOLN
INTRAMUSCULAR | Status: AC
  Filled 2016-07-09: qty 1

## 2016-07-09 MED ORDER — DEXAMETHASONE SODIUM PHOSPHATE 10 MG/ML IJ SOLN
10.0000 mg | Freq: Once | INTRAMUSCULAR | Status: AC
  Administered 2016-07-09: 10 mg via INTRAVENOUS

## 2016-07-09 NOTE — Telephone Encounter (Signed)
Scheduled appt per 7/3 los - Gave patient AVS and calender per los.  

## 2016-07-09 NOTE — Progress Notes (Signed)
[facility] [phone]   Fax:(336) [phone]  OFFICE PROGRESS NOTE  [name], MD [place] [address]  DIAGNOSIS: Metastatic non-small cell lung cancer initially diagnosed as locally advanced stage IIIB with a right Pancoast tumor involving the vertebral body as well as prominent canal invasion with spinal cord compression in October 2007. The patient also has metastatic disease to the brain in April 2008.  PRIOR THERAPY: 1. Status post concurrent chemoradiation with weekly carboplatin and paclitaxel, last dose was given November 18, 2005. 2. 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 3. Status post gamma knife radiotherapy to a solitary brain lesion located in the superior frontal area of the brain at [facility] in April of 2008. 4. Status post palliative radiotherapy to the lateral abdominal wall metastatic lesion under the care of Dr. Lisbeth Renshaw, completed March of 2009. 5. Status post 6 cycles of systemic chemotherapy with carboplatin and Alimta. Last dose was given July 26, 2007 with disease stabilization. 6. Gamma knife stereotactic radiotherapy to 2 brain lesions one involving the right frontal dural based as well as right parietal lesion performed on 05/07/2012 under the care of Dr. Vallarie Mare at [facility].  CURRENT THERAPY: Maintenance systemic chemotherapy with Alimta 500 MG/M2 every 3 weeks, status post 153 cycles.  INTERVAL HISTORY: Danielle [name] 66 y.o. female returns to the clinic today for follow-up visit accompanied by her sister. The patient is feeling fine today with no specific complaints. She tolerated the last cycle of her treatment well. She denied having any chest pain, shortness of breath, cough or hemoptysis. She denied having any weight loss or night sweats. She has no nausea, vomiting, diarrhea or constipation. The patient  is here today for evaluation before starting cycle 154.    MEDICAL HISTORY: Past Medical History:  Diagnosis Date  . Anemia   . 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 CVA (cerebral vascular accident) ([place]) 04/2014   pt states had 2 cva's within 2 wks  . DJD (degenerative joint disease), cervical   . Fibromyalgia   . H/O: pneumonia   . History of tobacco abuse quit 10/08   on nicotine patch  . Hypertension   . Hypokalemia   . Lung cancer ([place]) dx'd 09/2005   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 had chemo and radiation for lung ca  . Thrush 12/11/2010    ALLERGIES:  is allergic to contrast media [iodinated diagnostic agents]; iohexol; sulfa drugs cross reactors; and co-trimoxazole injection [sulfamethoxazole-trimethoprim].  MEDICATIONS:  Current Outpatient Prescriptions  Medication Sig Dispense Refill  . ALPRAZolam (XANAX) 0.25 MG tablet Take 1 tablet (0.25 mg total) by mouth at bedtime as needed. for sleep 30 tablet 0  . amLODipine (NORVASC) 10 MG tablet TAKE 1 TABLET (10 MG TOTAL) BY MOUTH DAILY. 90 tablet 1  . aspirin EC 325 MG tablet Take 1 tablet (325 mg total) by mouth daily. 90 tablet 0  . atorvastatin (LIPITOR) 40 MG tablet TAKE 1 TABLET BY MOUTH EVERY DAY AT 6 PM 90 tablet 3  . FeFum-FePoly-FA-B Cmp-C-Biot (INTEGRA PLUS) CAPS Take 1 capsule by mouth every morning. 90 capsule 0  . fluticasone (FLONASE) 50 MCG/ACT nasal spray Place 1-2 sprays into both nostrils daily as needed. 16 g 5  . folic acid (FOLVITE) 1 MG tablet TAKE 1 TABLET BY MOUTH DAILY. 90 tablet 1  . furosemide (LASIX) 20 MG tablet     .  lidocaine-prilocaine (EMLA) cream Apply to Port-A-Cath as directed 1-2 hours prior to chemotherapy 30 g 1  . lisinopril (PRINIVIL,ZESTRIL) 20 MG tablet TAKE 2 TABLETS (40 MG TOTAL) BY MOUTH DAILY. 180 tablet 1  . morphine (MS CONTIN) 60 MG 12 hr tablet Take 1 tablet (60 mg total) by mouth 2 (two) times daily. 60 tablet 0  . morphine (MSIR) 30 MG tablet  Take 1 tablet (30 mg total) by mouth every 4 (four) hours as needed (breakthrough pain). 60 tablet 0  . Olopatadine HCl 0.2 % SOLN Apply 1 drop to eye daily. 2.5 mL 11  . ondansetron (ZOFRAN) 8 MG tablet TAKE 1 TABLET (8 MG TOTAL) BY MOUTH 2 (TWO) TIMES DAILY AS NEEDED FOR NAUSEA OR VOMITING. 30 tablet 2  . predniSONE (DELTASONE) 50 MG tablet 1 tablet by mouth 13 hours ,7 hours and 1 hour  before CT scan 3 tablet 1  . triamcinolone (KENALOG) 0.025 % ointment Apply 1 application topically 2 (two) times daily. 30 g 0  . diphenhydrAMINE (BENADRYL) 25 MG tablet Take 1 tablet (25 mg total) by mouth once. 2 hours before CT scan on 09/18/2015 10 tablet 0   No current facility-administered medications for this visit.     SURGICAL HISTORY:  Past Surgical History:  Procedure Laterality Date  . CERVICAL LAMINECTOMY  1995  . KNEE SURGERY  1990   Left x 2  . 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 LOOP RECORDER IMPLANT N/A 04/19/2014   Procedure: LOOP RECORDER IMPLANT;  Surgeon: Thompson Grayer, MD;  Location: [facility] CATH LAB;  Service: Cardiovascular;  Laterality: N/A;  . OTHER SURGICAL HISTORY  2008   Gamma knife surgery to remove brain met   . PORTACATH PLACEMENT  -[name]   TIP IN CAVOATRIAL JUNCTION  . TEE WITHOUT CARDIOVERSION N/A 04/19/2014   Procedure: TRANSESOPHAGEAL ECHOCARDIOGRAM (TEE);  Surgeon: Larey Dresser, MD;  Location: [facility] ENDOSCOPY;  Service: Cardiovascular;  Laterality: N/A;    REVIEW OF SYSTEMS:  A comprehensive review of systems was negative.   PHYSICAL EXAMINATION: General appearance: alert, cooperative and no distress Head: Normocephalic, without obvious abnormality, atraumatic Neck: no adenopathy, no JVD, supple, symmetrical, trachea midline and thyroid not enlarged, symmetric, no tenderness/mass/nodules Lymph nodes: Cervical, supraclavicular, and axillary nodes normal. Resp: clear to auscultation bilaterally Back: symmetric, no curvature. ROM  normal. No CVA tenderness. Cardio: regular rate and rhythm, S1, S2 normal, no murmur, click, rub or gallop GI: soft, non-tender; bowel sounds normal; no masses,  no organomegaly Extremities: extremities normal, atraumatic, no cyanosis or edema  ECOG PERFORMANCE STATUS: 0 - Asymptomatic  Blood pressure (!) 127/57, pulse 71, temperature 97.6 F (36.4 C), temperature source Oral, resp. rate 18, height '5\' 3"'  (1.6 m), weight 135 lb 9.6 oz (61.5 kg), SpO2 100 %.  LABORATORY DATA: Lab Results  Component Value Date   WBC 8.1 07/09/2016   HGB 11.9 07/09/2016   HCT 35.3 07/09/2016   MCV 96.2 07/09/2016   PLT 345 07/09/2016      Chemistry      Component Value Date/Time   NA 139 07/09/2016 0850   K 4.3 07/09/2016 0850   CL 103 01/26/2015 0818   CL 106 06/30/2012 1004   CO2 28 07/09/2016 0850   BUN 15.8 07/09/2016 0850   CREATININE 1.2 (H) 07/09/2016 0850      Component Value Date/Time   CALCIUM 9.7 07/09/2016 0850   ALKPHOS 84 07/09/2016 0850   AST  24 07/09/2016 0850   ALT 11 07/09/2016 0850   BILITOT 0.38 07/09/2016 0850       RADIOGRAPHIC STUDIES: No results found.  ASSESSMENT AND PLAN: This is a very pleasant 66 years old white female with metastatic non-small cell lung cancer diagnosed in October 2007 status post concurrent chemoradiation followed by consolidation chemotherapy and she is currently on maintenance treatment with single agent Alimta status post 153 cycles. She has been tolerating her treatment fairly well with no significant adverse effects. I recommended for her to proceed with cycle #154 today as scheduled. I will see her back for follow-up visit in 3 weeks for evaluation before starting the next dose of her treatment. She was advised to call immediately if she has any concerning symptoms in the interval. The patient voices understanding of current disease status and treatment options and is in agreement with the current care plan. All questions were  answered. The patient knows to call the clinic with any problems, questions or concerns. We can certainly see the patient much sooner if necessary. I spent 10 minutes counseling the patient face to face. The total time spent in the appointment was 15 minutes.  Disclaimer: This note was dictated with voice recognition software. Similar sounding words can inadvertently be transcribed and may not be corrected upon review.

## 2016-07-09 NOTE — Patient Instructions (Signed)
[facility] Discharge Instructions for Patients Receiving Chemotherapy  Today you received the following chemotherapy agents; Alimta.   To help prevent nausea and vomiting after your treatment, we encourage you to take your nausea medication as directed.    If you develop nausea and vomiting that is not controlled by your nausea medication, call the clinic.   BELOW ARE SYMPTOMS THAT SHOULD BE REPORTED IMMEDIATELY:  *FEVER GREATER THAN 100.5 F  *CHILLS WITH OR WITHOUT FEVER  NAUSEA AND VOMITING THAT IS NOT CONTROLLED WITH YOUR NAUSEA MEDICATION  *UNUSUAL SHORTNESS OF BREATH  *UNUSUAL BRUISING OR BLEEDING  TENDERNESS IN MOUTH AND THROAT WITH OR WITHOUT PRESENCE OF ULCERS  *URINARY PROBLEMS  *BOWEL PROBLEMS  UNUSUAL RASH Items with * indicate a potential emergency and should be followed up as soon as possible.  Feel free to call the clinic you have any questions or concerns. The clinic phone number is [phone].  Please show the CHEMO ALERT CARD at check-in to the Emergency Department and triage nurse.   

## 2016-07-12 ENCOUNTER — Other Ambulatory Visit: Payer: Self-pay | Admitting: Emergency Medicine

## 2016-07-12 MED ORDER — FOLIC ACID 1 MG PO TABS: 1.0000 mg | ORAL_TABLET | Freq: Every day | ORAL | 1 refills | Status: DC

## 2016-07-15 ENCOUNTER — Other Ambulatory Visit: Payer: Self-pay | Admitting: Medical Oncology

## 2016-07-15 DIAGNOSIS — C3490 Malignant neoplasm of unspecified part of unspecified bronchus or lung: Secondary | ICD-10-CM

## 2016-07-15 DIAGNOSIS — C3410 Malignant neoplasm of upper lobe, unspecified bronchus or lung: Secondary | ICD-10-CM

## 2016-07-15 MED ORDER — MORPHINE SULFATE ER 60 MG PO TBCR: 60.0000 mg | EXTENDED_RELEASE_TABLET | Freq: Two times a day (BID) | ORAL | 0 refills | Status: DC

## 2016-07-15 MED ORDER — MORPHINE SULFATE 30 MG PO TABS: 30.0000 mg | ORAL_TABLET | ORAL | 0 refills | Status: DC | PRN

## 2016-07-16 ENCOUNTER — Other Ambulatory Visit: Payer: Self-pay | Admitting: Medical Oncology

## 2016-07-16 DIAGNOSIS — D649 Anemia, unspecified: Secondary | ICD-10-CM

## 2016-07-16 MED ORDER — INTEGRA PLUS PO CAPS: 1.0000 | ORAL_CAPSULE | Freq: Every morning | ORAL | 1 refills | Status: DC

## 2016-07-30 ENCOUNTER — Telehealth: Payer: Self-pay | Admitting: Internal Medicine

## 2016-07-30 ENCOUNTER — Other Ambulatory Visit (HOSPITAL_BASED_OUTPATIENT_CLINIC_OR_DEPARTMENT_OTHER): Payer: Medicare Other

## 2016-07-30 ENCOUNTER — Ambulatory Visit (HOSPITAL_BASED_OUTPATIENT_CLINIC_OR_DEPARTMENT_OTHER): Payer: Medicare Other

## 2016-07-30 ENCOUNTER — Ambulatory Visit (HOSPITAL_BASED_OUTPATIENT_CLINIC_OR_DEPARTMENT_OTHER): Payer: Medicare Other | Admitting: Internal Medicine

## 2016-07-30 ENCOUNTER — Encounter: Payer: Self-pay | Admitting: Internal Medicine

## 2016-07-30 DIAGNOSIS — C3411 Malignant neoplasm of upper lobe, right bronchus or lung: Secondary | ICD-10-CM

## 2016-07-30 DIAGNOSIS — C3491 Malignant neoplasm of unspecified part of right bronchus or lung: Secondary | ICD-10-CM | POA: Diagnosis not present

## 2016-07-30 DIAGNOSIS — C7931 Secondary malignant neoplasm of brain: Secondary | ICD-10-CM

## 2016-07-30 DIAGNOSIS — F411 Generalized anxiety disorder: Secondary | ICD-10-CM

## 2016-07-30 DIAGNOSIS — Z5111 Encounter for antineoplastic chemotherapy: Secondary | ICD-10-CM | POA: Diagnosis present

## 2016-07-30 LAB — COMPREHENSIVE METABOLIC PANEL
ALT: 11 U/L (ref 0–55)
ANION GAP: 8 meq/L (ref 3–11)
AST: 19 U/L (ref 5–34)
Albumin: 3.2 g/dL — ABNORMAL LOW (ref 3.5–5.0)
Alkaline Phosphatase: 82 U/L (ref 40–150)
BILIRUBIN TOTAL: 0.33 mg/dL (ref 0.20–1.20)
BUN: 9.3 mg/dL (ref 7.0–26.0)
CHLORIDE: 107 meq/L (ref 98–109)
CO2: 26 meq/L (ref 22–29)
Calcium: 9.7 mg/dL (ref 8.4–10.4)
Creatinine: 0.9 mg/dL (ref 0.6–1.1)
EGFR: 68 mL/min/{1.73_m2} — AB (ref >90)
GLUCOSE: 98 mg/dL (ref 70–140)
Potassium: 4.5 mEq/L (ref 3.5–5.1)
SODIUM: 141 meq/L (ref 136–145)
TOTAL PROTEIN: 6.8 g/dL (ref 6.4–8.3)

## 2016-07-30 LAB — CBC WITH DIFFERENTIAL/PLATELET
BASO%: 0.3 % (ref 0.0–2.0)
Basophils Absolute: 0 10*3/uL (ref 0.0–0.1)
EOS%: 1.5 % (ref 0.0–7.0)
Eosinophils Absolute: 0.1 10*3/uL (ref 0.0–0.5)
HCT: 37 % (ref 34.8–46.6)
HGB: 11.8 g/dL (ref 11.6–15.9)
LYMPH%: 25.6 % (ref 14.0–49.7)
MCH: 31.6 pg (ref 25.1–34.0)
MCHC: 31.9 g/dL (ref 31.5–36.0)
MCV: 98.9 fL (ref 79.5–101.0)
MONO#: 1 10*3/uL — ABNORMAL HIGH (ref 0.1–0.9)
MONO%: 13 % (ref 0.0–14.0)
NEUT%: 59.6 % (ref 38.4–76.8)
NEUTROS ABS: 4.6 10*3/uL (ref 1.5–6.5)
Platelets: 391 10*3/uL (ref 145–400)
RBC: 3.74 10*6/uL (ref 3.70–5.45)
RDW: 14.8 % — ABNORMAL HIGH (ref 11.2–14.5)
WBC: 7.8 10*3/uL (ref 3.9–10.3)
lymph#: 2 10*3/uL (ref 0.9–3.3)

## 2016-07-30 MED ORDER — SODIUM CHLORIDE 0.9 % IV SOLN
500.0000 mg/m2 | Freq: Once | INTRAVENOUS | Status: AC
  Administered 2016-07-30: 900 mg via INTRAVENOUS
  Filled 2016-07-30: qty 20

## 2016-07-30 MED ORDER — HEPARIN SOD (PORK) LOCK FLUSH 100 UNIT/ML IV SOLN
500.0000 [IU] | Freq: Once | INTRAVENOUS | Status: AC | PRN
  Administered 2016-07-30: 500 [IU]
  Filled 2016-07-30: qty 5

## 2016-07-30 MED ORDER — DEXAMETHASONE SODIUM PHOSPHATE 10 MG/ML IJ SOLN
INTRAMUSCULAR | Status: AC
  Filled 2016-07-30: qty 1

## 2016-07-30 MED ORDER — ALPRAZOLAM 0.25 MG PO TABS
0.2500 mg | ORAL_TABLET | Freq: Every evening | ORAL | 0 refills | Status: DC | PRN
Start: 2016-07-30 — End: 2016-07-30

## 2016-07-30 MED ORDER — ONDANSETRON HCL 8 MG PO TABS
ORAL_TABLET | ORAL | Status: AC
Start: 2016-07-30 — End: 2016-07-30
  Filled 2016-07-30: qty 1

## 2016-07-30 MED ORDER — DEXAMETHASONE SODIUM PHOSPHATE 10 MG/ML IJ SOLN
10.0000 mg | Freq: Once | INTRAMUSCULAR | Status: AC
  Administered 2016-07-30: 10 mg via INTRAVENOUS

## 2016-07-30 MED ORDER — ONDANSETRON HCL 8 MG PO TABS
8.0000 mg | ORAL_TABLET | Freq: Once | ORAL | Status: AC
  Administered 2016-07-30: 8 mg via ORAL

## 2016-07-30 MED ORDER — SODIUM CHLORIDE 0.9 % IV SOLN
Freq: Once | INTRAVENOUS | Status: AC
  Administered 2016-07-30: 11:00:00 via INTRAVENOUS

## 2016-07-30 MED ORDER — ALPRAZOLAM 0.25 MG PO TABS: 0.2500 mg | ORAL_TABLET | Freq: Every evening | ORAL | 0 refills | Status: DC | PRN

## 2016-07-30 MED ORDER — SODIUM CHLORIDE 0.9 % IJ SOLN
10.0000 mL | INTRAMUSCULAR | Status: DC | PRN
  Administered 2016-07-30: 10 mL
  Filled 2016-07-30: qty 10

## 2016-07-30 NOTE — Progress Notes (Signed)
[place] [phone]   Fax:(336) [phone]  OFFICE PROGRESS NOTE  [name], MD [place] [address]  DIAGNOSIS: Metastatic non-small cell lung cancer initially diagnosed as locally advanced stage IIIB with a right Pancoast tumor involving the vertebral body as well as prominent canal invasion with spinal cord compression in October 2007. The patient also has metastatic disease to the brain in April 2008.  PRIOR THERAPY: 1. Status post concurrent chemoradiation with weekly carboplatin and paclitaxel, last dose was given November 18, 2005. 2. 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 3. Status post gamma knife radiotherapy to a solitary brain lesion located in the superior frontal area of the brain at [facility] in April of 2008. 4. Status post palliative radiotherapy to the lateral abdominal wall metastatic lesion under the care of Dr. Lisbeth Renshaw, completed March of 2009. 5. Status post 6 cycles of systemic chemotherapy with carboplatin and Alimta. Last dose was given July 26, 2007 with disease stabilization. 6. Gamma knife stereotactic radiotherapy to 2 brain lesions one involving the right frontal dural based as well as right parietal lesion performed on 05/07/2012 under the care of Dr. Vallarie Mare at [facility].  CURRENT THERAPY: Maintenance systemic chemotherapy with Alimta 500 MG/M2 every 3 weeks, status post 154 cycles.  INTERVAL HISTORY: Danielle [name] 66 y.o. female returns to the clinic today for follow-up visit. The patient is feeling fine today with no specific complaints. The swelling in her lower extremities has significantly improved. She denied having any chest pain, shortness of breath, cough or hemoptysis. She has no nausea, vomiting, diarrhea or constipation. She has no weight loss or night sweats. She has no fever or chills. She is  tolerating her treatment well. She is here today for evaluation before starting cycle 155.  MEDICAL HISTORY: Past Medical History:  Diagnosis Date  . Anemia   . 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 CVA (cerebral vascular accident) ([place]) 04/2014   pt states had 2 cva's within 2 wks  . DJD (degenerative joint disease), cervical   . Fibromyalgia   . H/O: pneumonia   . History of tobacco abuse quit 10/08   on nicotine patch  . Hypertension   . Hypokalemia   . Lung cancer ([place]) dx'd 09/2005   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 had chemo and radiation for lung ca  . Thrush 12/11/2010    ALLERGIES:  is allergic to contrast media [iodinated diagnostic agents]; iohexol; sulfa drugs cross reactors; and co-trimoxazole injection [sulfamethoxazole-trimethoprim].  MEDICATIONS:  Current Outpatient Prescriptions  Medication Sig Dispense Refill  . ALPRAZolam (XANAX) 0.25 MG tablet Take 1 tablet (0.25 mg total) by mouth at bedtime as needed. for sleep 30 tablet 0  . amLODipine (NORVASC) 10 MG tablet TAKE 1 TABLET (10 MG TOTAL) BY MOUTH DAILY. 90 tablet 1  . aspirin EC 325 MG tablet Take 1 tablet (325 mg total) by mouth daily. 90 tablet 0  . atorvastatin (LIPITOR) 40 MG tablet TAKE 1 TABLET BY MOUTH EVERY DAY AT 6 PM 90 tablet 3  . FeFum-FePoly-FA-B Cmp-C-Biot (INTEGRA PLUS) CAPS Take 1 capsule by mouth every morning. 90 capsule 1  . fluticasone (FLONASE) 50 MCG/ACT nasal spray Place 1-2 sprays into both nostrils daily as needed. 16 g 5  . folic acid (FOLVITE) 1 MG tablet Take 1 tablet (1 mg total) by mouth daily. 90 tablet 1  .  furosemide (LASIX) 20 MG tablet     . lidocaine-prilocaine (EMLA) cream Apply to Port-A-Cath as directed 1-2 hours prior to chemotherapy 30 g 1  . lisinopril (PRINIVIL,ZESTRIL) 20 MG tablet TAKE 2 TABLETS (40 MG TOTAL) BY MOUTH DAILY. 180 tablet 1  . morphine (MS CONTIN) 60 MG 12 hr tablet Take 1 tablet (60 mg total) by mouth 2 (two) times daily. 60  tablet 0  . morphine (MSIR) 30 MG tablet Take 1 tablet (30 mg total) by mouth every 4 (four) hours as needed (breakthrough pain). 60 tablet 0  . Olopatadine HCl 0.2 % SOLN Apply 1 drop to eye daily. 2.5 mL 11  . ondansetron (ZOFRAN) 8 MG tablet TAKE 1 TABLET (8 MG TOTAL) BY MOUTH 2 (TWO) TIMES DAILY AS NEEDED FOR NAUSEA OR VOMITING. 30 tablet 2  . predniSONE (DELTASONE) 50 MG tablet 1 tablet by mouth 13 hours ,7 hours and 1 hour  before CT scan 3 tablet 1  . triamcinolone (KENALOG) 0.025 % ointment Apply 1 application topically 2 (two) times daily. 30 g 0  . diphenhydrAMINE (BENADRYL) 25 MG tablet Take 1 tablet (25 mg total) by mouth once. 2 hours before CT scan on 09/18/2015 10 tablet 0   No current facility-administered medications for this visit.     SURGICAL HISTORY:  Past Surgical History:  Procedure Laterality Date  . CERVICAL LAMINECTOMY  1995  . KNEE SURGERY  1990   Left x 2  . 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 LOOP RECORDER IMPLANT N/A 04/19/2014   Procedure: LOOP RECORDER IMPLANT;  Surgeon: Thompson Grayer, MD;  Location: [facility] CATH LAB;  Service: Cardiovascular;  Laterality: N/A;  . OTHER SURGICAL HISTORY  2008   Gamma knife surgery to remove brain met   . PORTACATH PLACEMENT  -[name]   TIP IN CAVOATRIAL JUNCTION  . TEE WITHOUT CARDIOVERSION N/A 04/19/2014   Procedure: TRANSESOPHAGEAL ECHOCARDIOGRAM (TEE);  Surgeon: Larey Dresser, MD;  Location: [facility] ENDOSCOPY;  Service: Cardiovascular;  Laterality: N/A;    REVIEW OF SYSTEMS:  A comprehensive review of systems was negative.   PHYSICAL EXAMINATION: General appearance: alert, cooperative and no distress Head: Normocephalic, without obvious abnormality, atraumatic Neck: no adenopathy, no JVD, supple, symmetrical, trachea midline and thyroid not enlarged, symmetric, no tenderness/mass/nodules Lymph nodes: Cervical, supraclavicular, and axillary nodes normal. Resp: clear to auscultation  bilaterally Back: symmetric, no curvature. ROM normal. No CVA tenderness. Cardio: regular rate and rhythm, S1, S2 normal, no murmur, click, rub or gallop GI: soft, non-tender; bowel sounds normal; no masses,  no organomegaly Extremities: extremities normal, atraumatic, no cyanosis or edema  ECOG PERFORMANCE STATUS: 0 - Asymptomatic  Blood pressure (!) 131/56, pulse 86, temperature 98.2 F (36.8 C), temperature source Oral, resp. rate 18, height 5' 3" (1.6 m), weight 136 lb 4.8 oz (61.8 kg), SpO2 91 %.  LABORATORY DATA: Lab Results  Component Value Date   WBC 7.8 07/30/2016   HGB 11.8 07/30/2016   HCT 37.0 07/30/2016   MCV 98.9 07/30/2016   PLT 391 07/30/2016      Chemistry      Component Value Date/Time   NA 141 07/30/2016 0932   K 4.5 07/30/2016 0932   CL 103 01/26/2015 0818   CL 106 06/30/2012 1004   CO2 26 07/30/2016 0932   BUN 9.3 07/30/2016 0932   CREATININE 0.9 07/30/2016 0932      Component Value Date/Time   CALCIUM 9.7 07/30/2016 0932  ALKPHOS 82 07/30/2016 0932   AST 19 07/30/2016 0932   ALT 11 07/30/2016 0932   BILITOT 0.33 07/30/2016 0932       RADIOGRAPHIC STUDIES: No results found.  ASSESSMENT AND PLAN: This is a very pleasant 66 years old white female with metastatic non-small cell lung cancer diagnosed in October 2007 status post concurrent chemoradiation followed by consolidation chemotherapy and she is currently on maintenance treatment with single agent Alimta status post 154 cycles. She has been tolerating her treatment fairly well with no significant adverse effects. The patient will proceed today with cycle 155 as a scheduled. I will see her back for follow-up visit in 3 weeks for evaluation before starting cycle 156. She was advised to call immediately if she has any concerning symptoms in the interval. The patient voices understanding of current disease status and treatment options and is in agreement with the current care plan. All  questions were answered. The patient knows to call the clinic with any problems, questions or concerns. We can certainly see the patient much sooner if necessary. I spent 10 minutes counseling the patient face to face. The total time spent in the appointment was 15 minutes.  Disclaimer: This note was dictated with voice recognition software. Similar sounding words can inadvertently be transcribed and may not be corrected upon review.

## 2016-07-30 NOTE — Patient Instructions (Signed)
[facility] Discharge Instructions for Patients Receiving Chemotherapy  Today you received the following chemotherapy agents Alimta.  To help prevent nausea and vomiting after your treatment, we encourage you to take your nausea medication as prescribed.   If you develop nausea and vomiting that is not controlled by your nausea medication, call the clinic.   BELOW ARE SYMPTOMS THAT SHOULD BE REPORTED IMMEDIATELY:  *FEVER GREATER THAN 100.5 F  *CHILLS WITH OR WITHOUT FEVER  NAUSEA AND VOMITING THAT IS NOT CONTROLLED WITH YOUR NAUSEA MEDICATION  *UNUSUAL SHORTNESS OF BREATH  *UNUSUAL BRUISING OR BLEEDING  TENDERNESS IN MOUTH AND THROAT WITH OR WITHOUT PRESENCE OF ULCERS  *URINARY PROBLEMS  *BOWEL PROBLEMS  UNUSUAL RASH Items with * indicate a potential emergency and should be followed up as soon as possible.  Feel free to call the clinic you have any questions or concerns. The clinic phone number is [phone].  Please show the CHEMO ALERT CARD at check-in to the Emergency Department and triage nurse.   

## 2016-07-30 NOTE — Telephone Encounter (Signed)
Scheduled appt per 7/24 los - Gave patient AVS and calender per los.  

## 2016-08-02 ENCOUNTER — Encounter: Payer: Self-pay | Admitting: Cardiology

## 2016-08-06 ENCOUNTER — Telehealth: Payer: Self-pay | Admitting: Family Medicine

## 2016-08-06 ENCOUNTER — Other Ambulatory Visit (INDEPENDENT_AMBULATORY_CARE_PROVIDER_SITE_OTHER): Payer: Medicare Other

## 2016-08-06 DIAGNOSIS — E119 Type 2 diabetes mellitus without complications: Secondary | ICD-10-CM

## 2016-08-06 LAB — LIPID PANEL
CHOL/HDL RATIO: 3
Cholesterol: 106 mg/dL (ref 0–200)
HDL: 35.8 mg/dL — AB (ref >39.00)
LDL CALC: 55 mg/dL (ref 0–99)
NONHDL: 69.96
Triglycerides: 74 mg/dL (ref 0.0–149.0)
VLDL: 14.8 mg/dL (ref 0.0–40.0)

## 2016-08-06 LAB — HEMOGLOBIN A1C: HEMOGLOBIN A1C: 5.6 % (ref 4.6–6.5)

## 2016-08-06 NOTE — Telephone Encounter (Signed)
-----   Message from Ellamae Sia sent at 07/24/2016  4:21 PM EDT ----- Regarding: Lab orders for Tuesday, 7.31.18 Patient is scheduled for CPX labs, please order future labs, Thanks , Karna Christmas

## 2016-08-09 ENCOUNTER — Encounter: Payer: Self-pay | Admitting: Family Medicine

## 2016-08-09 ENCOUNTER — Ambulatory Visit (INDEPENDENT_AMBULATORY_CARE_PROVIDER_SITE_OTHER): Payer: Medicare Other | Admitting: Family Medicine

## 2016-08-09 VITALS — BP 104/58 | HR 95 | Temp 98.5°F | Ht 63.0 in | Wt 132.2 lb

## 2016-08-09 DIAGNOSIS — C3411 Malignant neoplasm of upper lobe, right bronchus or lung: Secondary | ICD-10-CM

## 2016-08-09 DIAGNOSIS — E119 Type 2 diabetes mellitus without complications: Secondary | ICD-10-CM

## 2016-08-09 DIAGNOSIS — E785 Hyperlipidemia, unspecified: Secondary | ICD-10-CM

## 2016-08-09 DIAGNOSIS — E7800 Pure hypercholesterolemia, unspecified: Secondary | ICD-10-CM

## 2016-08-09 DIAGNOSIS — I63412 Cerebral infarction due to embolism of left middle cerebral artery: Secondary | ICD-10-CM | POA: Diagnosis not present

## 2016-08-09 DIAGNOSIS — C7931 Secondary malignant neoplasm of brain: Secondary | ICD-10-CM | POA: Diagnosis not present

## 2016-08-09 DIAGNOSIS — I10 Essential (primary) hypertension: Secondary | ICD-10-CM

## 2016-08-09 DIAGNOSIS — Z0000 Encounter for general adult medical examination without abnormal findings: Secondary | ICD-10-CM | POA: Diagnosis not present

## 2016-08-09 LAB — HM DIABETES FOOT EXAM

## 2016-08-09 NOTE — Assessment & Plan Note (Signed)
Well controlled. Continue current medication.  

## 2016-08-09 NOTE — Assessment & Plan Note (Signed)
Currently in active chemotherapy.

## 2016-08-09 NOTE — Progress Notes (Addendum)
Subjective:    Patient ID: Danielle [name], female    DOB: 12/04/1950, 66 y.o.   MRN: [id number]  HPI   The patient presents for annual medicare wellness, complete physical and review of chronic health problems.  I have personally reviewed the Medicare Annual Wellness questionnaire and have noted 1. The patient's medical and social history 2. Their use of alcohol, tobacco or illicit drugs 3. Their current medications and supplements 4. The patient's functional ability including ADL's, fall risks, home safety risks and hearing or visual             impairment. 5. Diet and physical activities 6. Evidence for depression or mood disorders 7.         Updated provider list Cognitive evaluation was performed and recorded on pt medicare questionnaire form. The patients weight, height, BMI and visual acuity have been recorded in the chart  I have made referrals, counseling and provided education to the patient based review of the above and I have provided the pt with a written personalized care plan for preventive services.   Documentation of this information was scanned into the electronic record under the media tab.   Metastatic lung cancer, mets to brain and bone. Metastatic non-small cell lung cancer initially diagnosed as locally advanced stage IIIB with a right Pancoast tumor involving the vertebral body as well as prominent canal invasion with spinal cord compression in October 2007. The patient also has metastatic disease to the brain in April 2008.   Currently undergoing chemotherapy. Treatment 155. Followed by Dr. Mora Appl.   Wt Readings from Last 3 Encounters:  08/09/16 132 lb 4 oz (60 kg)  07/30/16 136 lb 4.8 oz (61.8 kg)  07/09/16 135 lb 9.6 oz (61.5 kg)     Sore on right nose from glasses rubbing., Healing well.  Diabetes:  Due to prednisone. Good control on no medication. Lab Results  Component Value Date   HGBA1C 5.6 08/06/2016  Using medications without  difficulties: Hypoglycemic episodes:not checking Hyperglycemic episodes: checking Feet problems: yes foot swelling ( neg DVT) Blood Sugars averaging: not checking eye exam within last year: YEs  Elevated Cholesterol:  Good control, pt wishes to continue to treat with atorvastatin Lab Results  Component Value Date   CHOL 106 08/06/2016   HDL 35.80 (L) 08/06/2016   LDLCALC 55 08/06/2016   LDLDIRECT 105.5 08/06/2013   TRIG 74.0 08/06/2016   CHOLHDL 3 08/06/2016  Using medications without problems: Muscle aches:  Diet compliance:  Good Exercise: walking daily Other complaints:   Social History /Family History/Past Medical History reviewed in detail and updated in EMR if needed.  Hearing Screening   Method: Audiometry   125Hz  250Hz  500Hz  1000Hz  2000Hz  3000Hz  4000Hz  6000Hz  8000Hz   Right ear:   40 40 20  40    Left ear:   40 40 20  0    Vision Screening Comments: Wears glasses-Eye Exam at [facility] 06/2016  Advance directives and end of life planning reviewed in detail with patient and documented in EMR. Patient given handout on advance care directives if needed. HCPOA and living will updated if needed. Fall Risk  08/09/2016 05/08/2016 07/27/2015 01/18/2014 12/28/2013  Falls in the past year? No No No No No   Depression screen [facility] 2/9 08/09/2016 07/27/2015  Decreased Interest 0 0  Down, Depressed, Hopeless 0 1  PHQ - 2 Score 0 1  Some recent data might be hidden   Blood pressure (!) 104/58, pulse 95, temperature 98.5  F (36.9 C), temperature source Oral, height 5\' 3"  (1.6 m), weight 132 lb 4 oz (60 kg). BP Readings from Last 3 Encounters:  08/09/16 (!) 104/58  07/30/16 (!) 131/56  07/09/16 (!) 127/57     Review of Systems  Constitutional: Negative for fatigue and fever.  HENT: Negative for congestion.   Eyes: Negative for pain.  Respiratory: Positive for shortness of breath. Negative for cough and wheezing.   Cardiovascular: Negative for chest pain, palpitations and  leg swelling.  Gastrointestinal: Negative for abdominal pain.  Genitourinary: Negative for dysuria and vaginal bleeding.  Musculoskeletal: Negative for back pain.  Neurological: Negative for syncope, light-headedness and headaches.  Psychiatric/Behavioral: Negative for dysphoric mood.       Objective:   Physical Exam  Constitutional: Vital signs are normal. She appears well-developed and well-nourished. She is cooperative.  Non-toxic appearance. She does not appear ill. No distress.  HENT:  Head: Normocephalic.  Right Ear: Hearing, tympanic membrane, external ear and ear canal normal.  Left Ear: Hearing, tympanic membrane, external ear and ear canal normal.  Nose: Nose normal.  Eyes: Pupils are equal, round, and reactive to light. Conjunctivae, EOM and lids are normal. Lids are everted and swept, no foreign bodies found.  Neck: Trachea normal and normal range of motion. Neck supple. Carotid bruit is not present. No thyroid mass and no thyromegaly present.  Cardiovascular: Normal rate, regular rhythm, S1 normal, S2 normal, normal heart sounds and intact distal pulses.  Exam reveals no gallop.   No murmur heard. Bilateral lower ext edema.. Left > right  Pulmonary/Chest: Effort normal and breath sounds normal. No respiratory distress. She has no wheezes. She has no rhonchi. She has no rales.  Abdominal: Soft. Normal appearance and bowel sounds are normal. She exhibits no distension, no fluid wave, no abdominal bruit and no mass. There is no hepatosplenomegaly. There is no tenderness. There is no rebound, no guarding and no CVA tenderness. No hernia.  Lymphadenopathy:    She has no cervical adenopathy.    She has no axillary adenopathy.  Neurological: She is alert. She has normal strength. No cranial nerve deficit or sensory deficit.  Skin: Skin is warm, dry and intact. No rash noted.  abrasion on right nose  Psychiatric: Her speech is normal and behavior is normal. Judgment normal. Her  mood appears not anxious. Cognition and memory are normal. She does not exhibit a depressed mood.          Assessment & Plan:  The patient's preventative maintenance and recommended screening tests for an annual wellness exam were reviewed in full today. Brought up to date unless services declined.  Counselled on the importance of diet, exercise, and its role in overall health and mortality. The patient's FH and SH was reviewed, including their home life, tobacco status, and drug and alcohol status.   Health maintenance:   Hep C screening - completed HIV screening - completed A1C - completed Mammogram/ breast exam - declined PAP smear - declined Shingles - contraindicated Eye exam - pt reported exam in June 2018 Colonoscopy: declined

## 2016-08-09 NOTE — Assessment & Plan Note (Signed)
Followed by Dr. Rogue Jury with repeated scans or brain.

## 2016-08-09 NOTE — Assessment & Plan Note (Signed)
Improved control with diet, unexpected weight loss secondary to cancer.

## 2016-08-13 ENCOUNTER — Other Ambulatory Visit: Payer: Self-pay | Admitting: Internal Medicine

## 2016-08-13 DIAGNOSIS — C7931 Secondary malignant neoplasm of brain: Secondary | ICD-10-CM

## 2016-08-15 ENCOUNTER — Other Ambulatory Visit: Payer: Self-pay | Admitting: Medical Oncology

## 2016-08-15 DIAGNOSIS — C3490 Malignant neoplasm of unspecified part of unspecified bronchus or lung: Secondary | ICD-10-CM

## 2016-08-15 DIAGNOSIS — C3410 Malignant neoplasm of upper lobe, unspecified bronchus or lung: Secondary | ICD-10-CM

## 2016-08-15 MED ORDER — MORPHINE SULFATE ER 60 MG PO TBCR: 60.0000 mg | EXTENDED_RELEASE_TABLET | Freq: Two times a day (BID) | ORAL | 0 refills | Status: DC

## 2016-08-15 MED ORDER — MORPHINE SULFATE 30 MG PO TABS: 30.0000 mg | ORAL_TABLET | ORAL | 0 refills | Status: DC | PRN

## 2016-08-20 ENCOUNTER — Ambulatory Visit (HOSPITAL_BASED_OUTPATIENT_CLINIC_OR_DEPARTMENT_OTHER): Payer: Medicare Other | Admitting: Internal Medicine

## 2016-08-20 ENCOUNTER — Telehealth: Payer: Self-pay | Admitting: Internal Medicine

## 2016-08-20 ENCOUNTER — Other Ambulatory Visit (HOSPITAL_BASED_OUTPATIENT_CLINIC_OR_DEPARTMENT_OTHER): Payer: Medicare Other

## 2016-08-20 ENCOUNTER — Encounter: Payer: Self-pay | Admitting: Internal Medicine

## 2016-08-20 ENCOUNTER — Ambulatory Visit (HOSPITAL_BASED_OUTPATIENT_CLINIC_OR_DEPARTMENT_OTHER): Payer: Medicare Other

## 2016-08-20 VITALS — BP 146/60 | HR 85 | Temp 98.1°F | Resp 20 | Ht 63.0 in | Wt 133.7 lb

## 2016-08-20 DIAGNOSIS — C3490 Malignant neoplasm of unspecified part of unspecified bronchus or lung: Secondary | ICD-10-CM

## 2016-08-20 DIAGNOSIS — C7931 Secondary malignant neoplasm of brain: Secondary | ICD-10-CM | POA: Diagnosis not present

## 2016-08-20 DIAGNOSIS — C3491 Malignant neoplasm of unspecified part of right bronchus or lung: Secondary | ICD-10-CM

## 2016-08-20 DIAGNOSIS — C3411 Malignant neoplasm of upper lobe, right bronchus or lung: Secondary | ICD-10-CM

## 2016-08-20 DIAGNOSIS — Z5111 Encounter for antineoplastic chemotherapy: Secondary | ICD-10-CM | POA: Diagnosis present

## 2016-08-20 DIAGNOSIS — Z91041 Radiographic dye allergy status: Secondary | ICD-10-CM

## 2016-08-20 DIAGNOSIS — F411 Generalized anxiety disorder: Secondary | ICD-10-CM

## 2016-08-20 LAB — CBC WITH DIFFERENTIAL/PLATELET
BASO%: 0.2 % (ref 0.0–2.0)
BASOS ABS: 0 10*3/uL (ref 0.0–0.1)
EOS ABS: 0.1 10*3/uL (ref 0.0–0.5)
EOS%: 1 % (ref 0.0–7.0)
HEMATOCRIT: 38.5 % (ref 34.8–46.6)
HGB: 12.3 g/dL (ref 11.6–15.9)
LYMPH#: 1.7 10*3/uL (ref 0.9–3.3)
LYMPH%: 21 % (ref 14.0–49.7)
MCH: 31.4 pg (ref 25.1–34.0)
MCHC: 31.9 g/dL (ref 31.5–36.0)
MCV: 98.2 fL (ref 79.5–101.0)
MONO#: 0.9 10*3/uL (ref 0.1–0.9)
MONO%: 11.2 % (ref 0.0–14.0)
NEUT#: 5.5 10*3/uL (ref 1.5–6.5)
NEUT%: 66.6 % (ref 38.4–76.8)
PLATELETS: 380 10*3/uL (ref 145–400)
RBC: 3.92 10*6/uL (ref 3.70–5.45)
RDW: 14.3 % (ref 11.2–14.5)
WBC: 8.3 10*3/uL (ref 3.9–10.3)

## 2016-08-20 LAB — COMPREHENSIVE METABOLIC PANEL
ALT: 10 U/L (ref 0–55)
ANION GAP: 8 meq/L (ref 3–11)
AST: 19 U/L (ref 5–34)
Albumin: 3.2 g/dL — ABNORMAL LOW (ref 3.5–5.0)
Alkaline Phosphatase: 81 U/L (ref 40–150)
BUN: 11.5 mg/dL (ref 7.0–26.0)
CALCIUM: 10.1 mg/dL (ref 8.4–10.4)
CHLORIDE: 106 meq/L (ref 98–109)
CO2: 27 meq/L (ref 22–29)
CREATININE: 0.8 mg/dL (ref 0.6–1.1)
EGFR: 74 mL/min/{1.73_m2} — AB (ref >90)
Glucose: 105 mg/dl (ref 70–140)
Potassium: 4.5 mEq/L (ref 3.5–5.1)
Sodium: 141 mEq/L (ref 136–145)
Total Bilirubin: 0.37 mg/dL (ref 0.20–1.20)
Total Protein: 7 g/dL (ref 6.4–8.3)

## 2016-08-20 MED ORDER — CYANOCOBALAMIN 1000 MCG/ML IJ SOLN
INTRAMUSCULAR | Status: AC
  Filled 2016-08-20: qty 1

## 2016-08-20 MED ORDER — SODIUM CHLORIDE 0.9 % IV SOLN
Freq: Once | INTRAVENOUS | Status: AC
  Administered 2016-08-20: 12:00:00 via INTRAVENOUS

## 2016-08-20 MED ORDER — SODIUM CHLORIDE 0.9 % IV SOLN
800.0000 mg | Freq: Once | INTRAVENOUS | Status: AC
  Administered 2016-08-20: 800 mg via INTRAVENOUS
  Filled 2016-08-20: qty 20

## 2016-08-20 MED ORDER — ONDANSETRON HCL 8 MG PO TABS
8.0000 mg | ORAL_TABLET | Freq: Once | ORAL | Status: AC
  Administered 2016-08-20: 8 mg via ORAL

## 2016-08-20 MED ORDER — ONDANSETRON HCL 8 MG PO TABS
ORAL_TABLET | ORAL | Status: AC
Start: 2016-08-20 — End: 2016-08-20
  Filled 2016-08-20: qty 1

## 2016-08-20 MED ORDER — CYANOCOBALAMIN 1000 MCG/ML IJ SOLN
1000.0000 ug | Freq: Once | INTRAMUSCULAR | Status: AC
  Administered 2016-08-20: 1000 ug via INTRAMUSCULAR

## 2016-08-20 MED ORDER — ALPRAZOLAM 0.25 MG PO TABS: 0.2500 mg | ORAL_TABLET | Freq: Every evening | ORAL | 0 refills | Status: DC | PRN

## 2016-08-20 MED ORDER — PREDNISONE 50 MG PO TABS: ORAL_TABLET | ORAL | 1 refills | Status: DC

## 2016-08-20 MED ORDER — DEXAMETHASONE SODIUM PHOSPHATE 10 MG/ML IJ SOLN
10.0000 mg | Freq: Once | INTRAMUSCULAR | Status: AC
  Administered 2016-08-20: 10 mg via INTRAVENOUS

## 2016-08-20 MED ORDER — DEXAMETHASONE SODIUM PHOSPHATE 10 MG/ML IJ SOLN
INTRAMUSCULAR | Status: AC
  Filled 2016-08-20: qty 1

## 2016-08-20 MED ORDER — SODIUM CHLORIDE 0.9 % IJ SOLN
10.0000 mL | INTRAMUSCULAR | Status: DC | PRN
  Administered 2016-08-20: 10 mL
  Filled 2016-08-20: qty 10

## 2016-08-20 MED ORDER — HEPARIN SOD (PORK) LOCK FLUSH 100 UNIT/ML IV SOLN
500.0000 [IU] | Freq: Once | INTRAVENOUS | Status: AC | PRN
  Administered 2016-08-20: 500 [IU]
  Filled 2016-08-20: qty 5

## 2016-08-20 NOTE — Progress Notes (Signed)
[facility] Telephone:([phone]   [phone]  OFFICE PROGRESS NOTE  [name], MD [place] [address]  DIAGNOSIS: Metastatic non-small cell lung cancer initially diagnosed as locally advanced stage IIIB with a right Pancoast tumor involving the vertebral body as well as prominent canal invasion with spinal cord compression in October 2007. The patient also has metastatic disease to the brain in April 2008.  PRIOR THERAPY: 1. Status post concurrent chemoradiation with weekly carboplatin and paclitaxel, last dose was given November 18, 2005. 2. 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 3. Status post gamma knife radiotherapy to a solitary brain lesion located in the superior frontal area of the brain at [facility] in April of 2008. 4. Status post palliative radiotherapy to the lateral abdominal wall metastatic lesion under the care of Dr. Lisbeth Renshaw, completed March of 2009. 5. Status post 6 cycles of systemic chemotherapy with carboplatin and Alimta. Last dose was given July 26, 2007 with disease stabilization. 6. Gamma knife stereotactic radiotherapy to 2 brain lesions one involving the right frontal dural based as well as right parietal lesion performed on 05/07/2012 under the care of Dr. Vallarie Mare at [facility].  CURRENT THERAPY: Maintenance systemic chemotherapy with Alimta 500 MG/M2 every 3 weeks, status post 155 cycles.  INTERVAL HISTORY: Danielle [name] 66 y.o. female returns to the clinic today for follow-up visit accompanied by her sister. The patient is feeling fine today was no specific complaints. She continues to tolerate her maintenance treatment with single agent Alimta fairly well. She denied having any chest pain, shortness of breath, cough or hemoptysis. She denied having any fever or chills. She has no nausea, vomiting, diarrhea or  constipation. She says today for evaluation before starting cycle #156.   MEDICAL HISTORY: Past Medical History:  Diagnosis Date  . Anemia   . 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 CVA (cerebral vascular accident) ([place]) 04/2014   pt states had 2 cva's within 2 wks  . DJD (degenerative joint disease), cervical   . Fibromyalgia   . H/O: pneumonia   . History of tobacco abuse quit 10/08   on nicotine patch  . Hypertension   . Hypokalemia   . Lung cancer ([place]) dx'd 09/2005   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 had chemo and radiation for lung ca  . Thrush 12/11/2010    ALLERGIES:  is allergic to contrast media [iodinated diagnostic agents]; iohexol; sulfa drugs cross reactors; and co-trimoxazole injection [sulfamethoxazole-trimethoprim].  MEDICATIONS:  Current Outpatient Prescriptions  Medication Sig Dispense Refill  . ALPRAZolam (XANAX) 0.25 MG tablet Take 1 tablet (0.25 mg total) by mouth at bedtime as needed. for sleep 30 tablet 0  . amLODipine (NORVASC) 10 MG tablet TAKE 1 TABLET (10 MG TOTAL) BY MOUTH DAILY. 90 tablet 1  . aspirin EC 325 MG tablet Take 1 tablet (325 mg total) by mouth daily. 90 tablet 0  . atorvastatin (LIPITOR) 40 MG tablet TAKE 1 TABLET BY MOUTH EVERY DAY AT 6 PM 90 tablet 3  . FeFum-FePoly-FA-B Cmp-C-Biot (INTEGRA PLUS) CAPS Take 1 capsule by mouth every morning. 90 capsule 1  . fluticasone (FLONASE) 50 MCG/ACT nasal spray Place 1-2 sprays into both nostrils daily as needed. 16 g 5  . folic acid (FOLVITE) 1 MG tablet Take 1 tablet (1 mg total) by mouth daily. 90 tablet 1  . furosemide (LASIX) 20  MG tablet     . lidocaine-prilocaine (EMLA) cream Apply to Port-A-Cath as directed 1-2 hours prior to chemotherapy 30 g 1  . lisinopril (PRINIVIL,ZESTRIL) 20 MG tablet TAKE 2 TABLETS (40 MG TOTAL) BY MOUTH DAILY. 180 tablet 1  . morphine (MS CONTIN) 60 MG 12 hr tablet Take 1 tablet (60 mg total) by mouth 2 (two) times daily. 60 tablet 0  .  morphine (MSIR) 30 MG tablet Take 1 tablet (30 mg total) by mouth every 4 (four) hours as needed (breakthrough pain). 60 tablet 0  . Olopatadine HCl 0.2 % SOLN Apply 1 drop to eye daily. 2.5 mL 11  . ondansetron (ZOFRAN) 8 MG tablet TAKE 1 TABLET (8 MG TOTAL) BY MOUTH 2 (TWO) TIMES DAILY AS NEEDED FOR NAUSEA OR VOMITING. 30 tablet 0  . predniSONE (DELTASONE) 50 MG tablet 1 tablet by mouth 13 hours ,7 hours and 1 hour  before CT scan 3 tablet 1  . triamcinolone (KENALOG) 0.025 % ointment Apply 1 application topically 2 (two) times daily. 30 g 0  . diphenhydrAMINE (BENADRYL) 25 MG tablet Take 1 tablet (25 mg total) by mouth once. 2 hours before CT scan on 09/18/2015 10 tablet 0   No current facility-administered medications for this visit.     SURGICAL HISTORY:  Past Surgical History:  Procedure Laterality Date  . CERVICAL LAMINECTOMY  1995  . KNEE SURGERY  1990   Left x 2  . 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 LOOP RECORDER IMPLANT N/A 04/19/2014   Procedure: LOOP RECORDER IMPLANT;  Surgeon: Thompson Grayer, MD;  Location: [facility] CATH LAB;  Service: Cardiovascular;  Laterality: N/A;  . OTHER SURGICAL HISTORY  2008   Gamma knife surgery to remove brain met   . PORTACATH PLACEMENT  -[name]   TIP IN CAVOATRIAL JUNCTION  . TEE WITHOUT CARDIOVERSION N/A 04/19/2014   Procedure: TRANSESOPHAGEAL ECHOCARDIOGRAM (TEE);  Surgeon: Larey Dresser, MD;  Location: [facility] ENDOSCOPY;  Service: Cardiovascular;  Laterality: N/A;    REVIEW OF SYSTEMS:  A comprehensive review of systems was negative.   PHYSICAL EXAMINATION: General appearance: alert, cooperative and no distress Head: Normocephalic, without obvious abnormality, atraumatic Neck: no adenopathy, no JVD, supple, symmetrical, trachea midline and thyroid not enlarged, symmetric, no tenderness/mass/nodules Lymph nodes: Cervical, supraclavicular, and axillary nodes normal. Resp: clear to auscultation bilaterally Back:  symmetric, no curvature. ROM normal. No CVA tenderness. Cardio: regular rate and rhythm, S1, S2 normal, no murmur, click, rub or gallop GI: soft, non-tender; bowel sounds normal; no masses,  no organomegaly Extremities: extremities normal, atraumatic, no cyanosis or edema  ECOG PERFORMANCE STATUS: 0 - Asymptomatic  Blood pressure (!) 146/60, pulse 85, temperature 98.1 F (36.7 C), temperature source Oral, resp. rate 20, height '5\' 3"'  (1.6 m), weight 133 lb 11.2 oz (60.6 kg), SpO2 98 %.  LABORATORY DATA: Lab Results  Component Value Date   WBC 8.3 08/20/2016   HGB 12.3 08/20/2016   HCT 38.5 08/20/2016   MCV 98.2 08/20/2016   PLT 380 08/20/2016      Chemistry      Component Value Date/Time   NA 141 08/20/2016 0935   K 4.5 08/20/2016 0935   CL 103 01/26/2015 0818   CL 106 06/30/2012 1004   CO2 27 08/20/2016 0935   BUN 11.5 08/20/2016 0935   CREATININE 0.8 08/20/2016 0935      Component Value Date/Time   CALCIUM 10.1 08/20/2016 0935   ALKPHOS  81 08/20/2016 0935   AST 19 08/20/2016 0935   ALT 10 08/20/2016 0935   BILITOT 0.37 08/20/2016 0935       RADIOGRAPHIC STUDIES: No results found.  ASSESSMENT AND PLAN: This is a very pleasant 66 years old white female with metastatic non-small cell lung cancer diagnosed in October 2007 status post concurrent chemoradiation followed by consolidation chemotherapy and she is currently on maintenance treatment with single agent Alimta status post 155 cycles. The patient continues to tolerate her treatment fairly well. I recommended for her to continue her current treatment as scheduled and she will proceed with cycle #156 today. I will see her back for follow-up visit in 3 weeks for evaluation before starting 157 with repeat CT scan of the chest, abdomen and pelvis for restaging of her disease. I gave her a refill of Xanax today. She was advised to call immediately if she has any concerning symptoms in the interval. The patient voices  understanding of current disease status and treatment options and is in agreement with the current care plan. All questions were answered. The patient knows to call the clinic with any problems, questions or concerns. We can certainly see the patient much sooner if necessary. I spent 10 minutes counseling the patient face to face. The total time spent in the appointment was 15 minutes.  Disclaimer: This note was dictated with voice recognition software. Similar sounding words can inadvertently be transcribed and may not be corrected upon review.

## 2016-08-20 NOTE — Patient Instructions (Signed)
[facility] Discharge Instructions for Patients Receiving Chemotherapy  Today you received the following chemotherapy agents Alimta.  To help prevent nausea and vomiting after your treatment, we encourage you to take your nausea medication as prescribed.   If you develop nausea and vomiting that is not controlled by your nausea medication, call the clinic.   BELOW ARE SYMPTOMS THAT SHOULD BE REPORTED IMMEDIATELY:  *FEVER GREATER THAN 100.5 F  *CHILLS WITH OR WITHOUT FEVER  NAUSEA AND VOMITING THAT IS NOT CONTROLLED WITH YOUR NAUSEA MEDICATION  *UNUSUAL SHORTNESS OF BREATH  *UNUSUAL BRUISING OR BLEEDING  TENDERNESS IN MOUTH AND THROAT WITH OR WITHOUT PRESENCE OF ULCERS  *URINARY PROBLEMS  *BOWEL PROBLEMS  UNUSUAL RASH Items with * indicate a potential emergency and should be followed up as soon as possible.  Feel free to call the clinic you have any questions or concerns. The clinic phone number is [phone].  Please show the CHEMO ALERT CARD at check-in to the Emergency Department and triage nurse.   

## 2016-08-20 NOTE — Telephone Encounter (Signed)
Gave patient avs and calendar for updates.

## 2016-09-03 ENCOUNTER — Other Ambulatory Visit: Payer: Self-pay | Admitting: Family Medicine

## 2016-09-06 ENCOUNTER — Ambulatory Visit (HOSPITAL_COMMUNITY)
Admission: RE | Admit: 2016-09-06 | Discharge: 2016-09-06 | Disposition: A | Discharge status: Home / Self Care | Payer: Medicare Other | Source: Ambulatory Visit | Attending: Internal Medicine | Admitting: Internal Medicine

## 2016-09-06 ENCOUNTER — Ambulatory Visit (HOSPITAL_COMMUNITY): Payer: Medicare Other

## 2016-09-06 DIAGNOSIS — F411 Generalized anxiety disorder: Secondary | ICD-10-CM | POA: Insufficient documentation

## 2016-09-06 DIAGNOSIS — I700 Atherosclerosis of aorta: Secondary | ICD-10-CM | POA: Diagnosis not present

## 2016-09-06 DIAGNOSIS — Z5111 Encounter for antineoplastic chemotherapy: Secondary | ICD-10-CM

## 2016-09-06 DIAGNOSIS — I2510 Atherosclerotic heart disease of native coronary artery without angina pectoris: Secondary | ICD-10-CM | POA: Insufficient documentation

## 2016-09-06 DIAGNOSIS — J439 Emphysema, unspecified: Secondary | ICD-10-CM | POA: Diagnosis not present

## 2016-09-06 DIAGNOSIS — C7931 Secondary malignant neoplasm of brain: Secondary | ICD-10-CM

## 2016-09-06 DIAGNOSIS — C7951 Secondary malignant neoplasm of bone: Secondary | ICD-10-CM | POA: Insufficient documentation

## 2016-09-06 DIAGNOSIS — J90 Pleural effusion, not elsewhere classified: Secondary | ICD-10-CM | POA: Diagnosis not present

## 2016-09-06 DIAGNOSIS — Z923 Personal history of irradiation: Secondary | ICD-10-CM | POA: Insufficient documentation

## 2016-09-06 DIAGNOSIS — C3490 Malignant neoplasm of unspecified part of unspecified bronchus or lung: Secondary | ICD-10-CM

## 2016-09-06 DIAGNOSIS — C3411 Malignant neoplasm of upper lobe, right bronchus or lung: Secondary | ICD-10-CM | POA: Insufficient documentation

## 2016-09-06 MED ORDER — IOPAMIDOL (ISOVUE-300) INJECTION 61%
100.0000 mL | Freq: Once | INTRAVENOUS | Status: AC | PRN
  Administered 2016-09-06: 100 mL via INTRAVENOUS

## 2016-09-06 MED ORDER — HEPARIN SOD (PORK) LOCK FLUSH 100 UNIT/ML IV SOLN
INTRAVENOUS | Status: AC
  Administered 2016-09-06: 500 [IU]
  Filled 2016-09-06: qty 5

## 2016-09-06 MED ORDER — IOPAMIDOL (ISOVUE-300) INJECTION 61%
INTRAVENOUS | Status: AC
  Filled 2016-09-06: qty 100

## 2016-09-10 ENCOUNTER — Other Ambulatory Visit (HOSPITAL_BASED_OUTPATIENT_CLINIC_OR_DEPARTMENT_OTHER): Payer: Medicare Other

## 2016-09-10 ENCOUNTER — Telehealth: Payer: Self-pay | Admitting: Internal Medicine

## 2016-09-10 ENCOUNTER — Ambulatory Visit (HOSPITAL_BASED_OUTPATIENT_CLINIC_OR_DEPARTMENT_OTHER): Payer: Medicare Other | Admitting: Internal Medicine

## 2016-09-10 ENCOUNTER — Ambulatory Visit (HOSPITAL_BASED_OUTPATIENT_CLINIC_OR_DEPARTMENT_OTHER): Payer: Medicare Other

## 2016-09-10 ENCOUNTER — Encounter: Payer: Self-pay | Admitting: Internal Medicine

## 2016-09-10 DIAGNOSIS — Z5111 Encounter for antineoplastic chemotherapy: Secondary | ICD-10-CM

## 2016-09-10 DIAGNOSIS — C3411 Malignant neoplasm of upper lobe, right bronchus or lung: Secondary | ICD-10-CM

## 2016-09-10 DIAGNOSIS — C7931 Secondary malignant neoplasm of brain: Secondary | ICD-10-CM | POA: Diagnosis not present

## 2016-09-10 DIAGNOSIS — J90 Pleural effusion, not elsewhere classified: Secondary | ICD-10-CM | POA: Diagnosis not present

## 2016-09-10 DIAGNOSIS — C3491 Malignant neoplasm of unspecified part of right bronchus or lung: Secondary | ICD-10-CM

## 2016-09-10 DIAGNOSIS — C3490 Malignant neoplasm of unspecified part of unspecified bronchus or lung: Secondary | ICD-10-CM

## 2016-09-10 DIAGNOSIS — F411 Generalized anxiety disorder: Secondary | ICD-10-CM

## 2016-09-10 DIAGNOSIS — C3410 Malignant neoplasm of upper lobe, unspecified bronchus or lung: Secondary | ICD-10-CM

## 2016-09-10 LAB — COMPREHENSIVE METABOLIC PANEL
ALT: 11 U/L (ref 0–55)
ANION GAP: 8 meq/L (ref 3–11)
AST: 19 U/L (ref 5–34)
Albumin: 3.2 g/dL — ABNORMAL LOW (ref 3.5–5.0)
Alkaline Phosphatase: 75 U/L (ref 40–150)
BUN: 9.1 mg/dL (ref 7.0–26.0)
CALCIUM: 10.1 mg/dL (ref 8.4–10.4)
CHLORIDE: 106 meq/L (ref 98–109)
CO2: 27 mEq/L (ref 22–29)
CREATININE: 0.8 mg/dL (ref 0.6–1.1)
EGFR: 80 mL/min/{1.73_m2} — AB (ref >90)
Glucose: 119 mg/dl (ref 70–140)
POTASSIUM: 3.9 meq/L (ref 3.5–5.1)
Sodium: 141 mEq/L (ref 136–145)
Total Bilirubin: 0.54 mg/dL (ref 0.20–1.20)
Total Protein: 6.9 g/dL (ref 6.4–8.3)

## 2016-09-10 LAB — CBC WITH DIFFERENTIAL/PLATELET
BASO%: 1.1 % (ref 0.0–2.0)
BASOS ABS: 0.1 10*3/uL (ref 0.0–0.1)
EOS%: 1 % (ref 0.0–7.0)
Eosinophils Absolute: 0.1 10*3/uL (ref 0.0–0.5)
HEMATOCRIT: 38.9 % (ref 34.8–46.6)
HGB: 12.9 g/dL (ref 11.6–15.9)
LYMPH#: 1.7 10*3/uL (ref 0.9–3.3)
LYMPH%: 20.4 % (ref 14.0–49.7)
MCH: 31.7 pg (ref 25.1–34.0)
MCHC: 33.2 g/dL (ref 31.5–36.0)
MCV: 95.3 fL (ref 79.5–101.0)
MONO#: 0.8 10*3/uL (ref 0.1–0.9)
MONO%: 9.3 % (ref 0.0–14.0)
NEUT#: 5.7 10*3/uL (ref 1.5–6.5)
NEUT%: 68.2 % (ref 38.4–76.8)
PLATELETS: 329 10*3/uL (ref 145–400)
RBC: 4.08 10*6/uL (ref 3.70–5.45)
RDW: 14.5 % (ref 11.2–14.5)
WBC: 8.3 10*3/uL (ref 3.9–10.3)

## 2016-09-10 MED ORDER — DEXAMETHASONE SODIUM PHOSPHATE 10 MG/ML IJ SOLN
10.0000 mg | Freq: Once | INTRAMUSCULAR | Status: AC
  Administered 2016-09-10: 10 mg via INTRAVENOUS

## 2016-09-10 MED ORDER — DEXAMETHASONE SODIUM PHOSPHATE 10 MG/ML IJ SOLN
INTRAMUSCULAR | Status: AC
  Filled 2016-09-10: qty 1

## 2016-09-10 MED ORDER — SODIUM CHLORIDE 0.9 % IJ SOLN
10.0000 mL | INTRAMUSCULAR | Status: DC | PRN
  Administered 2016-09-10: 10 mL
  Filled 2016-09-10: qty 10

## 2016-09-10 MED ORDER — MORPHINE SULFATE ER 60 MG PO TBCR: 60.0000 mg | EXTENDED_RELEASE_TABLET | Freq: Two times a day (BID) | ORAL | 0 refills | Status: DC

## 2016-09-10 MED ORDER — SODIUM CHLORIDE 0.9 % IV SOLN
Freq: Once | INTRAVENOUS | Status: AC
  Administered 2016-09-10: 10:00:00 via INTRAVENOUS

## 2016-09-10 MED ORDER — SODIUM CHLORIDE 0.9 % IV SOLN
800.0000 mg | Freq: Once | INTRAVENOUS | Status: AC
  Administered 2016-09-10: 800 mg via INTRAVENOUS
  Filled 2016-09-10: qty 20

## 2016-09-10 MED ORDER — MORPHINE SULFATE 30 MG PO TABS: 30.0000 mg | ORAL_TABLET | ORAL | 0 refills | Status: DC | PRN

## 2016-09-10 MED ORDER — ONDANSETRON HCL 8 MG PO TABS
ORAL_TABLET | ORAL | Status: AC
  Filled 2016-09-10: qty 1

## 2016-09-10 MED ORDER — HEPARIN SOD (PORK) LOCK FLUSH 100 UNIT/ML IV SOLN
500.0000 [IU] | Freq: Once | INTRAVENOUS | Status: AC | PRN
  Administered 2016-09-10: 500 [IU]
  Filled 2016-09-10: qty 5

## 2016-09-10 MED ORDER — ONDANSETRON HCL 8 MG PO TABS
8.0000 mg | ORAL_TABLET | Freq: Once | ORAL | Status: AC
  Administered 2016-09-10: 8 mg via ORAL

## 2016-09-10 NOTE — Telephone Encounter (Signed)
Gave patient AVS and calendar of upcoming September and October appointments.

## 2016-09-10 NOTE — Patient Instructions (Signed)
[facility] Discharge Instructions for Patients Receiving Chemotherapy  Today you received the following chemotherapy agents; Alimta.   To help prevent nausea and vomiting after your treatment, we encourage you to take your nausea medication as directed.    If you develop nausea and vomiting that is not controlled by your nausea medication, call the clinic.   BELOW ARE SYMPTOMS THAT SHOULD BE REPORTED IMMEDIATELY:  *FEVER GREATER THAN 100.5 F  *CHILLS WITH OR WITHOUT FEVER  NAUSEA AND VOMITING THAT IS NOT CONTROLLED WITH YOUR NAUSEA MEDICATION  *UNUSUAL SHORTNESS OF BREATH  *UNUSUAL BRUISING OR BLEEDING  TENDERNESS IN MOUTH AND THROAT WITH OR WITHOUT PRESENCE OF ULCERS  *URINARY PROBLEMS  *BOWEL PROBLEMS  UNUSUAL RASH Items with * indicate a potential emergency and should be followed up as soon as possible.  Feel free to call the clinic you have any questions or concerns. The clinic phone number is [phone].  Please show the CHEMO ALERT CARD at check-in to the Emergency Department and triage nurse.   

## 2016-09-10 NOTE — Progress Notes (Signed)
[place] [phone]   [phone]  OFFICE PROGRESS NOTE  [name], MD [place] [address]  DIAGNOSIS: Metastatic non-small cell lung cancer initially diagnosed as locally advanced stage IIIB with a right Pancoast tumor involving the vertebral body as well as prominent canal invasion with spinal cord compression in October 2007. The patient also has metastatic disease to the brain in April 2008.  PRIOR THERAPY: 1. Status post concurrent chemoradiation with weekly carboplatin and paclitaxel, last dose was given November 18, 2005. 2. 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 3. Status post gamma knife radiotherapy to a solitary brain lesion located in the superior frontal area of the brain at [facility] in April of 2008. 4. Status post palliative radiotherapy to the lateral abdominal wall metastatic lesion under the care of Dr. Lisbeth Renshaw, completed March of 2009. 5. Status post 6 cycles of systemic chemotherapy with carboplatin and Alimta. Last dose was given July 26, 2007 with disease stabilization. 6. Gamma knife stereotactic radiotherapy to 2 brain lesions one involving the right frontal dural based as well as right parietal lesion performed on 05/07/2012 under the care of Dr. Vallarie Mare at [facility].  CURRENT THERAPY: Maintenance systemic chemotherapy with Alimta 500 MG/M2 every 3 weeks, status post 156 cycles.  INTERVAL HISTORY: Danielle [name] 66 y.o. female returns to the clinic today for follow-up visit accompanied by her sister. The patient is feeling fine today with no specific complaints. She has been tolerating her maintenance treatment with Alimta fairly well. She denied having any fever or chills. She has no chest pain, shortness of breath, cough or hemoptysis. She denied having any weight loss or night sweats. She denied  having any nausea, vomiting, diarrhea or constipation. The patient had repeat CT scan of the chest, abdomen and pelvis performed recently and she is here for evaluation and discussion of her scan results.    MEDICAL HISTORY: Past Medical History:  Diagnosis Date  . Anemia   . 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 CVA (cerebral vascular accident) ([place]) 04/2014   pt states had 2 cva's within 2 wks  . DJD (degenerative joint disease), cervical   . Fibromyalgia   . H/O: pneumonia   . History of tobacco abuse quit 10/08   on nicotine patch  . Hypertension   . Hypokalemia   . Lung cancer ([place]) dx'd 09/2005   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 had chemo and radiation for lung ca  . Thrush 12/11/2010    ALLERGIES:  is allergic to contrast media [iodinated diagnostic agents]; iohexol; sulfa drugs cross reactors; and co-trimoxazole injection [sulfamethoxazole-trimethoprim].  MEDICATIONS:  Current Outpatient Prescriptions  Medication Sig Dispense Refill  . ALPRAZolam (XANAX) 0.25 MG tablet Take 1 tablet (0.25 mg total) by mouth at bedtime as needed. for sleep 30 tablet 0  . amLODipine (NORVASC) 10 MG tablet TAKE 1 TABLET (10 MG TOTAL) BY MOUTH DAILY. 90 tablet 1  . aspirin EC 325 MG tablet Take 1 tablet (325 mg total) by mouth daily. 90 tablet 0  . atorvastatin (LIPITOR) 40 MG tablet TAKE 1 TABLET BY MOUTH EVERY DAY AT 6 PM 90 tablet 3  . diphenhydrAMINE (BENADRYL) 25 MG tablet Take 1 tablet (25 mg total) by mouth once. 2 hours before CT scan on 09/18/2015 10 tablet 0  . FeFum-FePoly-FA-B Cmp-C-Biot (INTEGRA PLUS) CAPS Take 1 capsule by  mouth every morning. 90 capsule 1  . fluticasone (FLONASE) 50 MCG/ACT nasal spray Place 1-2 sprays into both nostrils daily as needed. 16 g 5  . folic acid (FOLVITE) 1 MG tablet Take 1 tablet (1 mg total) by mouth daily. 90 tablet 1  . furosemide (LASIX) 20 MG tablet     . lidocaine-prilocaine (EMLA) cream Apply to Port-A-Cath as  directed 1-2 hours prior to chemotherapy 30 g 1  . lisinopril (PRINIVIL,ZESTRIL) 20 MG tablet TAKE 2 TABLETS (40 MG TOTAL) BY MOUTH DAILY. 180 tablet 1  . morphine (MS CONTIN) 60 MG 12 hr tablet Take 1 tablet (60 mg total) by mouth 2 (two) times daily. 60 tablet 0  . morphine (MSIR) 30 MG tablet Take 1 tablet (30 mg total) by mouth every 4 (four) hours as needed (breakthrough pain). 60 tablet 0  . Olopatadine HCl 0.2 % SOLN Apply 1 drop to eye daily. 2.5 mL 11  . ondansetron (ZOFRAN) 8 MG tablet TAKE 1 TABLET (8 MG TOTAL) BY MOUTH 2 (TWO) TIMES DAILY AS NEEDED FOR NAUSEA OR VOMITING. 30 tablet 0  . predniSONE (DELTASONE) 50 MG tablet 1 tablet by mouth 13 hours ,7 hours and 1 hour  before CT scan 3 tablet 1  . triamcinolone (KENALOG) 0.025 % ointment Apply 1 application topically 2 (two) times daily. 30 g 0   No current facility-administered medications for this visit.     SURGICAL HISTORY:  Past Surgical History:  Procedure Laterality Date  . CERVICAL LAMINECTOMY  1995  . KNEE SURGERY  1990   Left x 2  . 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 LOOP RECORDER IMPLANT N/A 04/19/2014   Procedure: LOOP RECORDER IMPLANT;  Surgeon: Thompson Grayer, MD;  Location: [facility] CATH LAB;  Service: Cardiovascular;  Laterality: N/A;  . OTHER SURGICAL HISTORY  2008   Gamma knife surgery to remove brain met   . PORTACATH PLACEMENT  -[name]   TIP IN CAVOATRIAL JUNCTION  . TEE WITHOUT CARDIOVERSION N/A 04/19/2014   Procedure: TRANSESOPHAGEAL ECHOCARDIOGRAM (TEE);  Surgeon: Larey Dresser, MD;  Location: [place];  Service: Cardiovascular;  Laterality: N/A;    REVIEW OF SYSTEMS:  Constitutional: negative Eyes: negative Ears, nose, mouth, throat, and face: negative Respiratory: negative Cardiovascular: negative Gastrointestinal: negative Genitourinary:negative Integument/breast: negative Hematologic/lymphatic: negative Musculoskeletal:negative Neurological:  negative Behavioral/Psych: negative Endocrine: negative Allergic/Immunologic: negative   PHYSICAL EXAMINATION: General appearance: alert, cooperative and no distress Head: Normocephalic, without obvious abnormality, atraumatic Neck: no adenopathy, no JVD, supple, symmetrical, trachea midline and thyroid not enlarged, symmetric, no tenderness/mass/nodules Lymph nodes: Cervical, supraclavicular, and axillary nodes normal. Resp: clear to auscultation bilaterally Back: symmetric, no curvature. ROM normal. No CVA tenderness. Cardio: regular rate and rhythm, S1, S2 normal, no murmur, click, rub or gallop GI: soft, non-tender; bowel sounds normal; no masses,  no organomegaly Extremities: extremities normal, atraumatic, no cyanosis or edema Neurologic: Alert and oriented X 3, normal strength and tone. Normal symmetric reflexes. Normal coordination and gait  ECOG PERFORMANCE STATUS: 0 - Asymptomatic  Blood pressure (!) 155/67, pulse 98, temperature 98.3 F (36.8 C), temperature source Oral, resp. rate 18, weight 128 lb 8 oz (58.3 kg), SpO2 100 %.  LABORATORY DATA: Lab Results  Component Value Date   WBC 8.3 09/10/2016   HGB 12.9 09/10/2016   HCT 38.9 09/10/2016   MCV 95.3 09/10/2016   PLT 329 09/10/2016      Chemistry      Component  Value Date/Time   NA 141 08/20/2016 0935   K 4.5 08/20/2016 0935   CL 103 01/26/2015 0818   CL 106 06/30/2012 1004   CO2 27 08/20/2016 0935   BUN 11.5 08/20/2016 0935   CREATININE 0.8 08/20/2016 0935      Component Value Date/Time   CALCIUM 10.1 08/20/2016 0935   ALKPHOS 81 08/20/2016 0935   AST 19 08/20/2016 0935   ALT 10 08/20/2016 0935   BILITOT 0.37 08/20/2016 0935       RADIOGRAPHIC STUDIES: Ct Chest W Contrast  Result Date: 09/06/2016 CLINICAL DATA:  Metastatic right upper lobe lung cancer to spine, brain, and omentum. Ongoing chemotherapy. Prior radiation therapy. EXAM: CT CHEST, ABDOMEN, AND PELVIS WITH CONTRAST TECHNIQUE:  Multidetector CT imaging of the chest, abdomen and pelvis was performed following the standard protocol during bolus administration of intravenous contrast. CONTRAST:  169m ISOVUE-300 IOPAMIDOL (ISOVUE-300) INJECTION 61% COMPARISON:  Multiple exams, including 05/09/2016 and 05/03/2016 FINDINGS: CT CHEST FINDINGS Cardiovascular: Coronary, aortic arch, and branch vessel atherosclerotic vascular disease. Right Port-A-Cath tip: Cavoatrial junction. Mediastinum/Nodes: No pathologic adenopathy is identified. Lungs/Pleura: In addition to the stable right apical pleural thickening and mild posterior pleural thickening at the left lung apex, there is a new small to moderate right pleural effusion which is nonspecific for transudative or exudative etiology. This might have a small loculated component superiorly. Severe emphysema. Bandlike triangular density anteriorly in the right upper lobe is pleural-based and currently measures 1.0 by 1.2 cm, by my measurements this was previously the same. Right perihilar density is chronic and possibly from prior radiation therapy. No new or enlarging pulmonary nodule. Bilateral airway thickening. Musculoskeletal: There continues to be some mild sclerosis and irregularity of the right first rib adjacent to the pleural thickening, possibly from chronic periostitis. Vertebro plana at T3 with vertebral augmentations at T4 and T7. This is a chronic appearance. Fused lower cervical vertebra. There is evidence of small regions of avascular necrosis of both humeral heads, without contour change or collapse. CT ABDOMEN PELVIS FINDINGS Hepatobiliary: Unremarkable Pancreas: Unremarkable Spleen: Unremarkable Adrenals/Urinary Tract: Unremarkable Stomach/Bowel: The area of right eccentric wall thickening at the anorectal junction is less prominent on today' s exam although there is potentially some low-grade enhancement in this vicinity on image [id number] could be due to a hemorrhoid.  Borderline generalized wall thickening in portions of the colon, notably the sigmoid colon. Vascular/Lymphatic: Aortoiliac atherosclerotic vascular disease. No pathologic adenopathy. Reproductive: Unremarkable Other: Currently I do not discern any omental caking of tumor or specific omental nodularity. Musculoskeletal: Small regions of chronic avascular necrosis of both hips, without contour change or collapse. Lumbar spondylosis and degenerative disc disease. Small amount of sclerosis posteriorly in the right L1 vertebral body not appreciably changed from recent exams, but technically nonspecific. IMPRESSION: 1. New small to moderate right pleural effusion, nonspecific for transudative or exudative etiology. 2. Stable pleural thickening at the right lung apex, with suspected chronic periostitis of the adjacent right first rib, not appreciably changed. 3. Other imaging findings of potential clinical significance: Aortic Atherosclerosis (ICD10-I70.0) and Emphysema (ICD10-J43.9). Coronary atherosclerosis. 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 Borderline generalized wall thickening in portions of the colon, notably the sigmoid colon, probably incidental. Small sclerotic lesion in the L1 vertebral body posteriorly on the right, not appreciably changed from multiple prior exams. Prior vertebral augmentations at T4 and T7 with vertebra plana at T3. Airway thickening is present, suggesting bronchitis or reactive  airways disease. Electronically Signed   By: Van Clines M.D.   On: 09/06/2016 10:44   Ct Abdomen Pelvis W Contrast  Result Date: 09/06/2016 CLINICAL DATA:  Metastatic right upper lobe lung cancer to spine, brain, and omentum. Ongoing chemotherapy. Prior radiation therapy. EXAM: CT CHEST, ABDOMEN, AND PELVIS WITH CONTRAST TECHNIQUE: Multidetector CT imaging of the chest, abdomen and pelvis was performed following the standard protocol  during bolus administration of intravenous contrast. CONTRAST:  177m ISOVUE-300 IOPAMIDOL (ISOVUE-300) INJECTION 61% COMPARISON:  Multiple exams, including 05/09/2016 and 05/03/2016 FINDINGS: CT CHEST FINDINGS Cardiovascular: Coronary, aortic arch, and branch vessel atherosclerotic vascular disease. Right Port-A-Cath tip: Cavoatrial junction. Mediastinum/Nodes: No pathologic adenopathy is identified. Lungs/Pleura: In addition to the stable right apical pleural thickening and mild posterior pleural thickening at the left lung apex, there is a new small to moderate right pleural effusion which is nonspecific for transudative or exudative etiology. This might have a small loculated component superiorly. Severe emphysema. Bandlike triangular density anteriorly in the right upper lobe is pleural-based and currently measures 1.0 by 1.2 cm, by my measurements this was previously the same. Right perihilar density is chronic and possibly from prior radiation therapy. No new or enlarging pulmonary nodule. Bilateral airway thickening. Musculoskeletal: There continues to be some mild sclerosis and irregularity of the right first rib adjacent to the pleural thickening, possibly from chronic periostitis. Vertebro plana at T3 with vertebral augmentations at T4 and T7. This is a chronic appearance. Fused lower cervical vertebra. There is evidence of small regions of avascular necrosis of both humeral heads, without contour change or collapse. CT ABDOMEN PELVIS FINDINGS Hepatobiliary: Unremarkable Pancreas: Unremarkable Spleen: Unremarkable Adrenals/Urinary Tract: Unremarkable Stomach/Bowel: The area of right eccentric wall thickening at the anorectal junction is less prominent on today' s exam although there is potentially some low-grade enhancement in this vicinity on image [id number] could be due to a hemorrhoid. Borderline generalized wall thickening in portions of the colon, notably the sigmoid colon.  Vascular/Lymphatic: Aortoiliac atherosclerotic vascular disease. No pathologic adenopathy. Reproductive: Unremarkable Other: Currently I do not discern any omental caking of tumor or specific omental nodularity. Musculoskeletal: Small regions of chronic avascular necrosis of both hips, without contour change or collapse. Lumbar spondylosis and degenerative disc disease. Small amount of sclerosis posteriorly in the right L1 vertebral body not appreciably changed from recent exams, but technically nonspecific. IMPRESSION: 1. New small to moderate right pleural effusion, nonspecific for transudative or exudative etiology. 2. Stable pleural thickening at the right lung apex, with suspected chronic periostitis of the adjacent right first rib, not appreciably changed. 3. Other imaging findings of potential clinical significance: Aortic Atherosclerosis (ICD10-I70.0) and Emphysema (ICD10-J43.9). Coronary atherosclerosis. 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 Borderline generalized wall thickening in portions of the colon, notably the sigmoid colon, probably incidental. Small sclerotic lesion in the L1 vertebral body posteriorly on the right, not appreciably changed from multiple prior exams. Prior vertebral augmentations at T4 and T7 with vertebra plana at T3. Airway thickening is present, suggesting bronchitis or reactive airways disease. Electronically Signed   By: WVan ClinesM.D.   On: 09/06/2016 10:44    ASSESSMENT AND PLAN: This is a very pleasant 66years old white female with metastatic non-small cell lung cancer diagnosed in October 2007 status post concurrent chemoradiation followed by consolidation chemotherapy and she is currently on maintenance treatment with single agent Alimta status post 156 cycles. The patient tolerated the last cycle  of her treatment well. She denied having any significant adverse effects. She had repeat CT  scan of the chest, abdomen and pelvis performed recently that showed no concerning findings for disease progression but there was development of nonspecific new small-to-moderate right pleural effusion probably secondary to fluid retention. The patient is currently on Lasix for the swelling of the lower extremities. I personally and independently reviewed the scan images and discuss the results with the patient and her sister. I recommended for her to continue her current treatment with maintenance Alimta and she will proceed with cycle 157 today. For pain management, she was giving refill for MS Contin and morphine sulfate. The patient will come back for follow-up visit in 3 weeks for evaluation before the next cycle of her treatment. The patient was advised to call immediately if she has any concerning symptoms in the interval. The patient voices understanding of current disease status and treatment options and is in agreement with the current care plan. All questions were answered. The patient knows to call the clinic with any problems, questions or concerns. We can certainly see the patient much sooner if necessary.  Disclaimer: This note was dictated with voice recognition software. Similar sounding words can inadvertently be transcribed and may not be corrected upon review.

## 2016-09-14 ENCOUNTER — Other Ambulatory Visit: Payer: Self-pay | Admitting: Internal Medicine

## 2016-09-14 DIAGNOSIS — C7931 Secondary malignant neoplasm of brain: Secondary | ICD-10-CM

## 2016-09-17 ENCOUNTER — Other Ambulatory Visit: Payer: Self-pay | Admitting: Internal Medicine

## 2016-09-17 DIAGNOSIS — C3411 Malignant neoplasm of upper lobe, right bronchus or lung: Secondary | ICD-10-CM

## 2016-09-17 DIAGNOSIS — C7931 Secondary malignant neoplasm of brain: Secondary | ICD-10-CM

## 2016-09-26 ENCOUNTER — Telehealth: Payer: Self-pay | Admitting: *Deleted

## 2016-09-26 DIAGNOSIS — C3411 Malignant neoplasm of upper lobe, right bronchus or lung: Secondary | ICD-10-CM

## 2016-09-26 DIAGNOSIS — C3490 Malignant neoplasm of unspecified part of unspecified bronchus or lung: Secondary | ICD-10-CM

## 2016-09-26 DIAGNOSIS — C7931 Secondary malignant neoplasm of brain: Secondary | ICD-10-CM

## 2016-09-26 DIAGNOSIS — Z5111 Encounter for antineoplastic chemotherapy: Secondary | ICD-10-CM

## 2016-09-26 DIAGNOSIS — F411 Generalized anxiety disorder: Secondary | ICD-10-CM

## 2016-09-26 MED ORDER — ALPRAZOLAM 0.25 MG PO TABS: 0.2500 mg | ORAL_TABLET | Freq: Every evening | ORAL | 0 refills | Status: DC | PRN

## 2016-09-26 NOTE — Telephone Encounter (Signed)
Rx refill for xanax called into [facility]. Per pt request.

## 2016-10-01 ENCOUNTER — Ambulatory Visit (HOSPITAL_BASED_OUTPATIENT_CLINIC_OR_DEPARTMENT_OTHER): Payer: Medicare Other

## 2016-10-01 ENCOUNTER — Other Ambulatory Visit (HOSPITAL_BASED_OUTPATIENT_CLINIC_OR_DEPARTMENT_OTHER): Payer: Medicare Other

## 2016-10-01 ENCOUNTER — Encounter: Payer: Self-pay | Admitting: Internal Medicine

## 2016-10-01 ENCOUNTER — Ambulatory Visit (HOSPITAL_BASED_OUTPATIENT_CLINIC_OR_DEPARTMENT_OTHER): Payer: Medicare Other | Admitting: Internal Medicine

## 2016-10-01 ENCOUNTER — Telehealth: Payer: Self-pay | Admitting: Internal Medicine

## 2016-10-01 VITALS — BP 144/67 | HR 89 | Temp 98.7°F | Resp 17 | Ht 63.0 in | Wt 130.8 lb

## 2016-10-01 DIAGNOSIS — C3411 Malignant neoplasm of upper lobe, right bronchus or lung: Secondary | ICD-10-CM

## 2016-10-01 DIAGNOSIS — Z5111 Encounter for antineoplastic chemotherapy: Secondary | ICD-10-CM | POA: Diagnosis not present

## 2016-10-01 DIAGNOSIS — C3491 Malignant neoplasm of unspecified part of right bronchus or lung: Secondary | ICD-10-CM

## 2016-10-01 DIAGNOSIS — C7931 Secondary malignant neoplasm of brain: Secondary | ICD-10-CM

## 2016-10-01 DIAGNOSIS — Z23 Encounter for immunization: Secondary | ICD-10-CM

## 2016-10-01 DIAGNOSIS — C7951 Secondary malignant neoplasm of bone: Secondary | ICD-10-CM | POA: Diagnosis not present

## 2016-10-01 DIAGNOSIS — R2243 Localized swelling, mass and lump, lower limb, bilateral: Secondary | ICD-10-CM | POA: Diagnosis not present

## 2016-10-01 LAB — COMPREHENSIVE METABOLIC PANEL
ALBUMIN: 3.1 g/dL — AB (ref 3.5–5.0)
ALK PHOS: 81 U/L (ref 40–150)
ALT: 8 U/L (ref 0–55)
AST: 18 U/L (ref 5–34)
Anion Gap: 6 mEq/L (ref 3–11)
BUN: 7.8 mg/dL (ref 7.0–26.0)
CALCIUM: 9.8 mg/dL (ref 8.4–10.4)
CHLORIDE: 106 meq/L (ref 98–109)
CO2: 28 mEq/L (ref 22–29)
CREATININE: 0.8 mg/dL (ref 0.6–1.1)
EGFR: 84 mL/min/{1.73_m2} — ABNORMAL LOW (ref >90)
Glucose: 105 mg/dl (ref 70–140)
Potassium: 4.5 mEq/L (ref 3.5–5.1)
Sodium: 141 mEq/L (ref 136–145)
Total Bilirubin: 0.39 mg/dL (ref 0.20–1.20)
Total Protein: 6.9 g/dL (ref 6.4–8.3)

## 2016-10-01 LAB — CBC WITH DIFFERENTIAL/PLATELET
BASO%: 0.4 % (ref 0.0–2.0)
Basophils Absolute: 0 10*3/uL (ref 0.0–0.1)
EOS ABS: 0.1 10*3/uL (ref 0.0–0.5)
EOS%: 0.6 % (ref 0.0–7.0)
HEMATOCRIT: 35.4 % (ref 34.8–46.6)
HEMOGLOBIN: 11.8 g/dL (ref 11.6–15.9)
LYMPH#: 1.7 10*3/uL (ref 0.9–3.3)
LYMPH%: 18.1 % (ref 14.0–49.7)
MCH: 31.5 pg (ref 25.1–34.0)
MCHC: 33.3 g/dL (ref 31.5–36.0)
MCV: 94.7 fL (ref 79.5–101.0)
MONO#: 1 10*3/uL — ABNORMAL HIGH (ref 0.1–0.9)
MONO%: 11.3 % (ref 0.0–14.0)
NEUT#: 6.5 10*3/uL (ref 1.5–6.5)
NEUT%: 69.6 % (ref 38.4–76.8)
Platelets: 444 10*3/uL — ABNORMAL HIGH (ref 145–400)
RBC: 3.74 10*6/uL (ref 3.70–5.45)
RDW: 14.1 % (ref 11.2–14.5)
WBC: 9.3 10*3/uL (ref 3.9–10.3)

## 2016-10-01 MED ORDER — SODIUM CHLORIDE 0.9 % IV SOLN
Freq: Once | INTRAVENOUS | Status: AC
  Administered 2016-10-01: 11:00:00 via INTRAVENOUS

## 2016-10-01 MED ORDER — HEPARIN SOD (PORK) LOCK FLUSH 100 UNIT/ML IV SOLN
500.0000 [IU] | Freq: Once | INTRAVENOUS | Status: AC | PRN
  Administered 2016-10-01: 500 [IU]
  Filled 2016-10-01: qty 5

## 2016-10-01 MED ORDER — SODIUM CHLORIDE 0.9 % IV SOLN
800.0000 mg | Freq: Once | INTRAVENOUS | Status: AC
  Administered 2016-10-01: 800 mg via INTRAVENOUS
  Filled 2016-10-01: qty 20

## 2016-10-01 MED ORDER — ONDANSETRON HCL 8 MG PO TABS
8.0000 mg | ORAL_TABLET | Freq: Once | ORAL | Status: AC
  Administered 2016-10-01: 8 mg via ORAL

## 2016-10-01 MED ORDER — INFLUENZA VAC SPLIT QUAD 0.5 ML IM SUSY
0.5000 mL | PREFILLED_SYRINGE | Freq: Once | INTRAMUSCULAR | Status: AC
  Administered 2016-10-01: 0.5 mL via INTRAMUSCULAR
  Filled 2016-10-01: qty 0.5

## 2016-10-01 MED ORDER — FUROSEMIDE 20 MG PO TABS: 20.0000 mg | ORAL_TABLET | Freq: Every day | ORAL | 0 refills | Status: DC

## 2016-10-01 MED ORDER — SODIUM CHLORIDE 0.9 % IJ SOLN
10.0000 mL | INTRAMUSCULAR | Status: DC | PRN
  Administered 2016-10-01: 10 mL
  Filled 2016-10-01: qty 10

## 2016-10-01 MED ORDER — DEXAMETHASONE SODIUM PHOSPHATE 10 MG/ML IJ SOLN
INTRAMUSCULAR | Status: AC
  Filled 2016-10-01: qty 1

## 2016-10-01 MED ORDER — DEXAMETHASONE SODIUM PHOSPHATE 10 MG/ML IJ SOLN
10.0000 mg | Freq: Once | INTRAMUSCULAR | Status: AC
  Administered 2016-10-01: 10 mg via INTRAVENOUS

## 2016-10-01 MED ORDER — ONDANSETRON HCL 8 MG PO TABS
ORAL_TABLET | ORAL | Status: AC
  Filled 2016-10-01: qty 1

## 2016-10-01 NOTE — Progress Notes (Signed)
[place] [phone]   [phone]  OFFICE PROGRESS NOTE  Danielle Sanders, MD [place] [address]  DIAGNOSIS: Metastatic non-small cell lung cancer initially diagnosed as locally advanced stage IIIB with a right Pancoast tumor involving the vertebral body as well as prominent canal invasion with spinal cord compression in October 2007. The patient also has metastatic disease to the brain in April 2008.  PRIOR THERAPY: 1. Status post concurrent chemoradiation with weekly carboplatin and paclitaxel, last dose was given November 18, 2005. 2. 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 3. Status post gamma knife radiotherapy to a solitary brain lesion located in the superior frontal area of the brain at [facility] in April of 2008. 4. Status post palliative radiotherapy to the lateral abdominal wall metastatic lesion under the care of Dr. Lisbeth Renshaw, completed March of 2009. 5. Status post 6 cycles of systemic chemotherapy with carboplatin and Alimta. Last dose was given July 26, 2007 with disease stabilization. 6. Gamma knife stereotactic radiotherapy to 2 brain lesions one involving the right frontal dural based as well as right parietal lesion performed on 05/07/2012 under the care of Dr. Vallarie Mare at [facility].  CURRENT THERAPY: Maintenance systemic chemotherapy with Alimta 500 MG/M2 every 3 weeks, status post 157 cycles.  INTERVAL HISTORY: Danielle [name] 66 y.o. female returns to the clinic today for follow-up visit accompanied by her daughter, Danielle [name]. The patient continues to tolerate her maintenance treatment with single agent Alimta fairly well. She denied having any chest pain, shortness of breath, cough or hemoptysis. She has no fever or chills. She denied having any nausea, vomiting, diarrhea or constipation.the patient has no significant  weight loss or night sweats. She is here today for evaluation before starting cycle #158.  MEDICAL HISTORY: Past Medical History:  Diagnosis Date  . Anemia   . 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 CVA (cerebral vascular accident) ([place]) 04/2014   pt states had 2 cva's within 2 wks  . DJD (degenerative joint disease), cervical   . Fibromyalgia   . H/O: pneumonia   . History of tobacco abuse quit 10/08   on nicotine patch  . Hypertension   . Hypokalemia   . Lung cancer ([place]) dx'd 09/2005   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 had chemo and radiation for lung ca  . Thrush 12/11/2010    ALLERGIES:  is allergic to contrast media [iodinated diagnostic agents]; iohexol; sulfa drugs cross reactors; and co-trimoxazole injection [sulfamethoxazole-trimethoprim].  MEDICATIONS:  Current Outpatient Prescriptions  Medication Sig Dispense Refill  . ALPRAZolam (XANAX) 0.25 MG tablet Take 1 tablet (0.25 mg total) by mouth at bedtime as needed. for sleep 30 tablet 0  . amLODipine (NORVASC) 10 MG tablet TAKE 1 TABLET (10 MG TOTAL) BY MOUTH DAILY. 90 tablet 1  . aspirin EC 325 MG tablet TAKE 1 TABLET BY MOUTH EVERY DAY 90 tablet 0  . atorvastatin (LIPITOR) 40 MG tablet TAKE 1 TABLET BY MOUTH EVERY DAY AT 6 PM 90 tablet 3  . FeFum-FePoly-FA-B Cmp-C-Biot (INTEGRA PLUS) CAPS Take 1 capsule by mouth every morning. 90 capsule 1  . fluticasone (FLONASE) 50 MCG/ACT nasal spray Place 1-2 sprays into both nostrils daily as needed. 16 g 5  . folic acid (FOLVITE) 1 MG tablet Take 1 tablet (1 mg total) by mouth daily. 90 tablet 1  . furosemide (LASIX) 20 MG  tablet     . lidocaine-prilocaine (EMLA) cream Apply to Port-A-Cath as directed 1-2 hours prior to chemotherapy 30 g 1  . lisinopril (PRINIVIL,ZESTRIL) 20 MG tablet TAKE 2 TABLETS (40 MG TOTAL) BY MOUTH DAILY. 180 tablet 1  . morphine (MS CONTIN) 60 MG 12 hr tablet Take 1 tablet (60 mg total) by mouth 2 (two) times daily. 60 tablet 0  .  morphine (MSIR) 30 MG tablet Take 1 tablet (30 mg total) by mouth every 4 (four) hours as needed (breakthrough pain). 60 tablet 0  . Olopatadine HCl 0.2 % SOLN Apply 1 drop to eye daily. 2.5 mL 11  . ondansetron (ZOFRAN) 8 MG tablet TAKE 1 TABLET (8 MG TOTAL) BY MOUTH 2 (TWO) TIMES DAILY AS NEEDED FOR NAUSEA OR VOMITING. 30 tablet 0  . predniSONE (DELTASONE) 50 MG tablet 1 tablet by mouth 13 hours ,7 hours and 1 hour  before CT scan 3 tablet 1  . triamcinolone (KENALOG) 0.025 % ointment Apply 1 application topically 2 (two) times daily. 30 g 0  . diphenhydrAMINE (BENADRYL) 25 MG tablet Take 1 tablet (25 mg total) by mouth once. 2 hours before CT scan on 09/18/2015 10 tablet 0   No current facility-administered medications for this visit.     SURGICAL HISTORY:  Past Surgical History:  Procedure Laterality Date  . CERVICAL LAMINECTOMY  1995  . KNEE SURGERY  1990   Left x 2  . 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 LOOP RECORDER IMPLANT N/A 04/19/2014   Procedure: LOOP RECORDER IMPLANT;  Surgeon: Thompson Grayer, MD;  Location: [facility] CATH LAB;  Service: Cardiovascular;  Laterality: N/A;  . OTHER SURGICAL HISTORY  2008   Gamma knife surgery to remove brain met   . PORTACATH PLACEMENT  -[name]   TIP IN CAVOATRIAL JUNCTION  . TEE WITHOUT CARDIOVERSION N/A 04/19/2014   Procedure: TRANSESOPHAGEAL ECHOCARDIOGRAM (TEE);  Surgeon: Larey Dresser, MD;  Location: [facility] ENDOSCOPY;  Service: Cardiovascular;  Laterality: N/A;    REVIEW OF SYSTEMS:  A comprehensive review of systems was negative.   PHYSICAL EXAMINATION: General appearance: alert, cooperative and no distress Head: Normocephalic, without obvious abnormality, atraumatic Neck: no adenopathy, no JVD, supple, symmetrical, trachea midline and thyroid not enlarged, symmetric, no tenderness/mass/nodules Lymph nodes: Cervical, supraclavicular, and axillary nodes normal. Resp: clear to auscultation bilaterally Back:  symmetric, no curvature. ROM normal. No CVA tenderness. Cardio: regular rate and rhythm, S1, S2 normal, no murmur, click, rub or gallop GI: soft, non-tender; bowel sounds normal; no masses,  no organomegaly Extremities: extremities normal, atraumatic, no cyanosis or edema  ECOG PERFORMANCE STATUS: 0 - Asymptomatic  Blood pressure (!) 144/67, pulse 89, temperature 98.7 F (37.1 C), temperature source Oral, resp. rate 17, height '5\' 3"'  (1.6 m), weight 130 lb 12.8 oz (59.3 kg), SpO2 97 %.  LABORATORY DATA: Lab Results  Component Value Date   WBC 9.3 10/01/2016   HGB 11.8 10/01/2016   HCT 35.4 10/01/2016   MCV 94.7 10/01/2016   PLT 444 (H) 10/01/2016      Chemistry      Component Value Date/Time   NA 141 10/01/2016 0930   K 4.5 10/01/2016 0930   CL 103 01/26/2015 0818   CL 106 06/30/2012 1004   CO2 28 10/01/2016 0930   BUN 7.8 10/01/2016 0930   CREATININE 0.8 10/01/2016 0930      Component Value Date/Time   CALCIUM 9.8 10/01/2016 0930   ALKPHOS  81 10/01/2016 0930   AST 18 10/01/2016 0930   ALT 8 10/01/2016 0930   BILITOT 0.39 10/01/2016 0930       RADIOGRAPHIC STUDIES: Ct Chest W Contrast  Result Date: 09/06/2016 CLINICAL DATA:  Metastatic right upper lobe lung cancer to spine, brain, and omentum. Ongoing chemotherapy. Prior radiation therapy. EXAM: CT CHEST, ABDOMEN, AND PELVIS WITH CONTRAST TECHNIQUE: Multidetector CT imaging of the chest, abdomen and pelvis was performed following the standard protocol during bolus administration of intravenous contrast. CONTRAST:  158m ISOVUE-300 IOPAMIDOL (ISOVUE-300) INJECTION 61% COMPARISON:  Multiple exams, including 05/09/2016 and 05/03/2016 FINDINGS: CT CHEST FINDINGS Cardiovascular: Coronary, aortic arch, and branch vessel atherosclerotic vascular disease. Right Port-A-Cath tip: Cavoatrial junction. Mediastinum/Nodes: No pathologic adenopathy is identified. Lungs/Pleura: In addition to the stable right apical pleural thickening and  mild posterior pleural thickening at the left lung apex, there is a new small to moderate right pleural effusion which is nonspecific for transudative or exudative etiology. This might have a small loculated component superiorly. Severe emphysema. Bandlike triangular density anteriorly in the right upper lobe is pleural-based and currently measures 1.0 by 1.2 cm, by my measurements this was previously the same. Right perihilar density is chronic and possibly from prior radiation therapy. No new or enlarging pulmonary nodule. Bilateral airway thickening. Musculoskeletal: There continues to be some mild sclerosis and irregularity of the right first rib adjacent to the pleural thickening, possibly from chronic periostitis. Vertebro plana at T3 with vertebral augmentations at T4 and T7. This is a chronic appearance. Fused lower cervical vertebra. There is evidence of small regions of avascular necrosis of both humeral heads, without contour change or collapse. CT ABDOMEN PELVIS FINDINGS Hepatobiliary: Unremarkable Pancreas: Unremarkable Spleen: Unremarkable Adrenals/Urinary Tract: Unremarkable Stomach/Bowel: The area of right eccentric wall thickening at the anorectal junction is less prominent on today' s exam although there is potentially some low-grade enhancement in this vicinity on image [id number] could be due to a hemorrhoid. Borderline generalized wall thickening in portions of the colon, notably the sigmoid colon. Vascular/Lymphatic: Aortoiliac atherosclerotic vascular disease. No pathologic adenopathy. Reproductive: Unremarkable Other: Currently I do not discern any omental caking of tumor or specific omental nodularity. Musculoskeletal: Small regions of chronic avascular necrosis of both hips, without contour change or collapse. Lumbar spondylosis and degenerative disc disease. Small amount of sclerosis posteriorly in the right L1 vertebral body not appreciably changed from recent exams, but  technically nonspecific. IMPRESSION: 1. New small to moderate right pleural effusion, nonspecific for transudative or exudative etiology. 2. Stable pleural thickening at the right lung apex, with suspected chronic periostitis of the adjacent right first rib, not appreciably changed. 3. Other imaging findings of potential clinical significance: Aortic Atherosclerosis (ICD10-I70.0) and Emphysema (ICD10-J43.9). Coronary atherosclerosis. 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 Borderline generalized wall thickening in portions of the colon, notably the sigmoid colon, probably incidental. Small sclerotic lesion in the L1 vertebral body posteriorly on the right, not appreciably changed from multiple prior exams. Prior vertebral augmentations at T4 and T7 with vertebra plana at T3. Airway thickening is present, suggesting bronchitis or reactive airways disease. Electronically Signed   By: WVan ClinesM.D.   On: 09/06/2016 10:44   Ct Abdomen Pelvis W Contrast  Result Date: 09/06/2016 CLINICAL DATA:  Metastatic right upper lobe lung cancer to spine, brain, and omentum. Ongoing chemotherapy. Prior radiation therapy. EXAM: CT CHEST, ABDOMEN, AND PELVIS WITH CONTRAST TECHNIQUE: Multidetector CT imaging of the chest,  abdomen and pelvis was performed following the standard protocol during bolus administration of intravenous contrast. CONTRAST:  151m ISOVUE-300 IOPAMIDOL (ISOVUE-300) INJECTION 61% COMPARISON:  Multiple exams, including 05/09/2016 and 05/03/2016 FINDINGS: CT CHEST FINDINGS Cardiovascular: Coronary, aortic arch, and branch vessel atherosclerotic vascular disease. Right Port-A-Cath tip: Cavoatrial junction. Mediastinum/Nodes: No pathologic adenopathy is identified. Lungs/Pleura: In addition to the stable right apical pleural thickening and mild posterior pleural thickening at the left lung apex, there is a new small to moderate right  pleural effusion which is nonspecific for transudative or exudative etiology. This might have a small loculated component superiorly. Severe emphysema. Bandlike triangular density anteriorly in the right upper lobe is pleural-based and currently measures 1.0 by 1.2 cm, by my measurements this was previously the same. Right perihilar density is chronic and possibly from prior radiation therapy. No new or enlarging pulmonary nodule. Bilateral airway thickening. Musculoskeletal: There continues to be some mild sclerosis and irregularity of the right first rib adjacent to the pleural thickening, possibly from chronic periostitis. Vertebro plana at T3 with vertebral augmentations at T4 and T7. This is a chronic appearance. Fused lower cervical vertebra. There is evidence of small regions of avascular necrosis of both humeral heads, without contour change or collapse. CT ABDOMEN PELVIS FINDINGS Hepatobiliary: Unremarkable Pancreas: Unremarkable Spleen: Unremarkable Adrenals/Urinary Tract: Unremarkable Stomach/Bowel: The area of right eccentric wall thickening at the anorectal junction is less prominent on today' s exam although there is potentially some low-grade enhancement in this vicinity on image [id number] could be due to a hemorrhoid. Borderline generalized wall thickening in portions of the colon, notably the sigmoid colon. Vascular/Lymphatic: Aortoiliac atherosclerotic vascular disease. No pathologic adenopathy. Reproductive: Unremarkable Other: Currently I do not discern any omental caking of tumor or specific omental nodularity. Musculoskeletal: Small regions of chronic avascular necrosis of both hips, without contour change or collapse. Lumbar spondylosis and degenerative disc disease. Small amount of sclerosis posteriorly in the right L1 vertebral body not appreciably changed from recent exams, but technically nonspecific. IMPRESSION: 1. New small to moderate right pleural effusion, nonspecific for  transudative or exudative etiology. 2. Stable pleural thickening at the right lung apex, with suspected chronic periostitis of the adjacent right first rib, not appreciably changed. 3. Other imaging findings of potential clinical significance: Aortic Atherosclerosis (ICD10-I70.0) and Emphysema (ICD10-J43.9). Coronary atherosclerosis. 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 Borderline generalized wall thickening in portions of the colon, notably the sigmoid colon, probably incidental. Small sclerotic lesion in the L1 vertebral body posteriorly on the right, not appreciably changed from multiple prior exams. Prior vertebral augmentations at T4 and T7 with vertebra plana at T3. Airway thickening is present, suggesting bronchitis or reactive airways disease. Electronically Signed   By: WVan ClinesM.D.   On: 09/06/2016 10:44    ASSESSMENT AND PLAN: This is a very pleasant 66years old white female with metastatic non-small cell lung cancer diagnosed in October 2007 status post concurrent chemoradiation followed by consolidation chemotherapy and she is currently on maintenance treatment with single agent Alimta status post 157 cycles. He patient continues to tolerate her maintenance treatment fairly well. I recommended for her to proceed with cycle 158 today as scheduled. I would see her back for follow-up visit in 3 weeks for evaluation before the next cycle of her treatment. For the swelling of the lower extremities, she will use Lasix 20 mg by mouth daily as needed for the swelling. She was advised to  call immediately if she has any concerning symptoms in the interval. The patient voices understanding of current disease status and treatment options and is in agreement with the current care plan. All questions were answered. The patient knows to call the clinic with any problems, questions or concerns. We can certainly see the patient  much sooner if necessary.  Disclaimer: This note was dictated with voice recognition software. Similar sounding words can inadvertently be transcribed and may not be corrected upon review.

## 2016-10-01 NOTE — Patient Instructions (Signed)
[place] Discharge Instructions for Patients Receiving Chemotherapy  Today you received the following chemotherapy agents Alimta  To help prevent nausea and vomiting after your treatment, we encourage you to take your nausea medication as directed.   If you develop nausea and vomiting that is not controlled by your nausea medication, call the clinic.   BELOW ARE SYMPTOMS THAT SHOULD BE REPORTED IMMEDIATELY:  *FEVER GREATER THAN 100.5 F  *CHILLS WITH OR WITHOUT FEVER  NAUSEA AND VOMITING THAT IS NOT CONTROLLED WITH YOUR NAUSEA MEDICATION  *UNUSUAL SHORTNESS OF BREATH  *UNUSUAL BRUISING OR BLEEDING  TENDERNESS IN MOUTH AND THROAT WITH OR WITHOUT PRESENCE OF ULCERS  *URINARY PROBLEMS  *BOWEL PROBLEMS  UNUSUAL RASH Items with * indicate a potential emergency and should be followed up as soon as possible.  Feel free to call the clinic you have any questions or concerns. The clinic phone number is [phone].  Please show the [place] at check-in to the Emergency Department and triage nurse.  Influenza Virus Vaccine injection (Fluarix) What is this medicine? INFLUENZA VIRUS VACCINE (in floo EN zuh VAHY ruhs vak SEEN) helps to reduce the risk of getting influenza also known as the flu. This medicine may be used for other purposes; ask your health care provider or pharmacist if you have questions. COMMON BRAND NAME(S): Fluarix, Fluzone What should I tell my health care provider before I take this medicine? They need to know if you have any of these conditions: -bleeding disorder like hemophilia -fever or infection -Guillain-Barre syndrome or other neurological problems -immune system problems -infection with the human immunodeficiency virus (HIV) or AIDS -low blood platelet counts -multiple sclerosis -an unusual or allergic reaction to influenza virus vaccine, eggs, chicken proteins, latex, gentamicin, other medicines, foods, dyes or  preservatives -pregnant or trying to get pregnant -breast-feeding How should I use this medicine? This vaccine is for injection into a muscle. It is given by a health care professional. A copy of Vaccine Information Statements will be given before each vaccination. Read this sheet carefully each time. The sheet may change frequently. Talk to your pediatrician regarding the use of this medicine in children. Special care may be needed. Overdosage: If you think you have taken too much of this medicine contact a poison control center or emergency room at once. NOTE: This medicine is only for you. Do not share this medicine with others. What may interact with this medicine? -chemotherapy or radiation therapy -medicines that lower your immune system like etanercept, anakinra, infliximab, and adalimumab -medicines that treat or prevent blood clots like warfarin -phenytoin -steroid medicines like prednisone or cortisone -theophylline -vaccines This list may not describe all possible interactions. Give your health care provider a list of all the medicines, herbs, non-prescription drugs, or dietary supplements you use. Also tell them if you smoke, drink alcohol, or use illegal drugs. Some items may interact with your medicine. What should I watch for while using this medicine? Report any side effects that do not go away within 3 days to your doctor or health care professional. Call your health care provider if any unusual symptoms occur within 6 weeks of receiving this vaccine. You may still catch the flu, but the illness is not usually as bad. You cannot get the flu from the vaccine. The vaccine will not protect against colds or other illnesses that may cause fever. The vaccine is needed every year. What side effects may I notice from receiving this medicine? Side effects that  you should report to your doctor or health care professional as soon as possible: -allergic reactions like skin rash, itching  or hives, swelling of the face, lips, or tongue Side effects that usually do not require medical attention (report to your doctor or health care professional if they continue or are bothersome): -fever -headache -muscle aches and pains -pain, tenderness, redness, or swelling at site where injected -weak or tired This list may not describe all possible side effects. Call your doctor for medical advice about side effects. You may report side effects to FDA at 1-800-FDA-1088. Where should I keep my medicine? This vaccine is only given in a clinic, pharmacy, doctor's office, or other health care setting and will not be stored at home. NOTE: This sheet is a summary. It may not cover all possible information. If you have questions about this medicine, talk to your doctor, pharmacist, or health care provider.  2018 Elsevier/Gold Standard (2007-07-22 09:30:40)

## 2016-10-01 NOTE — Telephone Encounter (Signed)
Gave avs and calendar for October-November

## 2016-10-07 ENCOUNTER — Telehealth: Payer: Self-pay | Admitting: Internal Medicine

## 2016-10-07 NOTE — Telephone Encounter (Signed)
10/06/16 prescription refill

## 2016-10-09 ENCOUNTER — Telehealth: Payer: Self-pay | Admitting: Internal Medicine

## 2016-10-09 ENCOUNTER — Other Ambulatory Visit: Payer: Self-pay | Admitting: Medical Oncology

## 2016-10-09 DIAGNOSIS — Z85841 Personal history of malignant neoplasm of brain: Secondary | ICD-10-CM | POA: Diagnosis not present

## 2016-10-09 DIAGNOSIS — Z8673 Personal history of transient ischemic attack (TIA), and cerebral infarction without residual deficits: Secondary | ICD-10-CM | POA: Diagnosis not present

## 2016-10-09 DIAGNOSIS — Z9221 Personal history of antineoplastic chemotherapy: Secondary | ICD-10-CM | POA: Diagnosis not present

## 2016-10-09 DIAGNOSIS — D649 Anemia, unspecified: Secondary | ICD-10-CM

## 2016-10-09 DIAGNOSIS — Z923 Personal history of irradiation: Secondary | ICD-10-CM | POA: Diagnosis not present

## 2016-10-09 DIAGNOSIS — C3490 Malignant neoplasm of unspecified part of unspecified bronchus or lung: Secondary | ICD-10-CM | POA: Diagnosis not present

## 2016-10-09 DIAGNOSIS — C7931 Secondary malignant neoplasm of brain: Secondary | ICD-10-CM | POA: Diagnosis not present

## 2016-10-09 MED ORDER — INTEGRA PLUS PO CAPS: 1.0000 | ORAL_CAPSULE | Freq: Every morning | ORAL | 1 refills | Status: DC

## 2016-10-09 NOTE — Telephone Encounter (Signed)
Received Request for a refill/new prescription from [facility]pharm[facility] on 10/08/2016.  A copy of this is located in [facility] under the Media tab for viewing and printing.

## 2016-10-09 NOTE — Telephone Encounter (Signed)
10/08/16 prescription refill. Prescription scanned in media tab

## 2016-10-11 ENCOUNTER — Other Ambulatory Visit: Payer: Self-pay | Admitting: *Deleted

## 2016-10-11 NOTE — Telephone Encounter (Signed)
Rx for Integra sent 10/3 by Provider

## 2016-10-15 ENCOUNTER — Other Ambulatory Visit: Payer: Self-pay | Admitting: Medical Oncology

## 2016-10-15 ENCOUNTER — Telehealth: Payer: Self-pay | Admitting: Medical Oncology

## 2016-10-15 DIAGNOSIS — C3410 Malignant neoplasm of upper lobe, unspecified bronchus or lung: Secondary | ICD-10-CM

## 2016-10-15 DIAGNOSIS — C3490 Malignant neoplasm of unspecified part of unspecified bronchus or lung: Secondary | ICD-10-CM

## 2016-10-15 MED ORDER — MORPHINE SULFATE 30 MG PO TABS: 30.0000 mg | ORAL_TABLET | ORAL | 0 refills | Status: DC | PRN

## 2016-10-15 MED ORDER — MORPHINE SULFATE ER 60 MG PO TBCR: 60.0000 mg | EXTENDED_RELEASE_TABLET | Freq: Two times a day (BID) | ORAL | 0 refills | Status: DC

## 2016-10-15 NOTE — Plan of Care (Signed)
mscontin and msir rx put on [employer] in folder .needs signature.

## 2016-10-15 NOTE — Telephone Encounter (Signed)
prescriptions to [facility] for signature

## 2016-10-18 ENCOUNTER — Other Ambulatory Visit: Payer: Self-pay | Admitting: Internal Medicine

## 2016-10-19 ENCOUNTER — Other Ambulatory Visit: Payer: Self-pay | Admitting: Internal Medicine

## 2016-10-19 ENCOUNTER — Other Ambulatory Visit: Payer: Self-pay | Admitting: Family Medicine

## 2016-10-19 DIAGNOSIS — C7931 Secondary malignant neoplasm of brain: Secondary | ICD-10-CM

## 2016-10-22 ENCOUNTER — Telehealth: Payer: Self-pay | Admitting: Internal Medicine

## 2016-10-22 ENCOUNTER — Encounter: Payer: Self-pay | Admitting: Internal Medicine

## 2016-10-22 ENCOUNTER — Ambulatory Visit (HOSPITAL_BASED_OUTPATIENT_CLINIC_OR_DEPARTMENT_OTHER): Payer: Medicare Other

## 2016-10-22 ENCOUNTER — Other Ambulatory Visit (HOSPITAL_BASED_OUTPATIENT_CLINIC_OR_DEPARTMENT_OTHER): Payer: Medicare Other

## 2016-10-22 ENCOUNTER — Ambulatory Visit (HOSPITAL_BASED_OUTPATIENT_CLINIC_OR_DEPARTMENT_OTHER): Payer: Medicare Other | Admitting: Internal Medicine

## 2016-10-22 VITALS — BP 117/65 | HR 92 | Temp 98.6°F | Resp 18 | Ht 63.0 in | Wt 128.3 lb

## 2016-10-22 DIAGNOSIS — C3411 Malignant neoplasm of upper lobe, right bronchus or lung: Secondary | ICD-10-CM

## 2016-10-22 DIAGNOSIS — C7951 Secondary malignant neoplasm of bone: Secondary | ICD-10-CM

## 2016-10-22 DIAGNOSIS — Z5111 Encounter for antineoplastic chemotherapy: Secondary | ICD-10-CM

## 2016-10-22 DIAGNOSIS — C3491 Malignant neoplasm of unspecified part of right bronchus or lung: Secondary | ICD-10-CM

## 2016-10-22 DIAGNOSIS — C7931 Secondary malignant neoplasm of brain: Secondary | ICD-10-CM

## 2016-10-22 LAB — COMPREHENSIVE METABOLIC PANEL
ALBUMIN: 3.2 g/dL — AB (ref 3.5–5.0)
ALT: 12 U/L (ref 0–55)
AST: 23 U/L (ref 5–34)
Alkaline Phosphatase: 74 U/L (ref 40–150)
Anion Gap: 10 mEq/L (ref 3–11)
BUN: 12.8 mg/dL (ref 7.0–26.0)
CHLORIDE: 107 meq/L (ref 98–109)
CO2: 24 meq/L (ref 22–29)
Calcium: 9.4 mg/dL (ref 8.4–10.4)
Creatinine: 0.9 mg/dL (ref 0.6–1.1)
EGFR: >60 mL/min/{1.73_m2} (ref >60)
GLUCOSE: 110 mg/dL (ref 70–140)
POTASSIUM: 4.5 meq/L (ref 3.5–5.1)
SODIUM: 141 meq/L (ref 136–145)
Total Bilirubin: 0.44 mg/dL (ref 0.20–1.20)
Total Protein: 7.3 g/dL (ref 6.4–8.3)

## 2016-10-22 LAB — CBC WITH DIFFERENTIAL/PLATELET
BASO%: 0.5 % (ref 0.0–2.0)
BASOS ABS: 0 10*3/uL (ref 0.0–0.1)
EOS%: 1 % (ref 0.0–7.0)
Eosinophils Absolute: 0.1 10*3/uL (ref 0.0–0.5)
HCT: 34 % — ABNORMAL LOW (ref 34.8–46.6)
HEMOGLOBIN: 11.2 g/dL — AB (ref 11.6–15.9)
LYMPH%: 22.6 % (ref 14.0–49.7)
MCH: 31.4 pg (ref 25.1–34.0)
MCHC: 33.1 g/dL (ref 31.5–36.0)
MCV: 95.1 fL (ref 79.5–101.0)
MONO#: 0.9 10*3/uL (ref 0.1–0.9)
MONO%: 10.7 % (ref 0.0–14.0)
NEUT#: 5.4 10*3/uL (ref 1.5–6.5)
NEUT%: 65.2 % (ref 38.4–76.8)
Platelets: 397 10*3/uL (ref 145–400)
RBC: 3.57 10*6/uL — ABNORMAL LOW (ref 3.70–5.45)
RDW: 15.6 % — AB (ref 11.2–14.5)
WBC: 8.3 10*3/uL (ref 3.9–10.3)
lymph#: 1.9 10*3/uL (ref 0.9–3.3)

## 2016-10-22 MED ORDER — DEXAMETHASONE SODIUM PHOSPHATE 10 MG/ML IJ SOLN
INTRAMUSCULAR | Status: AC
  Filled 2016-10-22: qty 1

## 2016-10-22 MED ORDER — ONDANSETRON HCL 8 MG PO TABS
ORAL_TABLET | ORAL | Status: AC
  Filled 2016-10-22: qty 1

## 2016-10-22 MED ORDER — CYANOCOBALAMIN 1000 MCG/ML IJ SOLN
1000.0000 ug | Freq: Once | INTRAMUSCULAR | Status: AC
  Administered 2016-10-22: 1000 ug via INTRAMUSCULAR

## 2016-10-22 MED ORDER — SODIUM CHLORIDE 0.9 % IV SOLN
Freq: Once | INTRAVENOUS | Status: AC
  Administered 2016-10-22: 09:00:00 via INTRAVENOUS

## 2016-10-22 MED ORDER — SODIUM CHLORIDE 0.9 % IJ SOLN
10.0000 mL | INTRAMUSCULAR | Status: DC | PRN
  Administered 2016-10-22: 10 mL
  Filled 2016-10-22: qty 10

## 2016-10-22 MED ORDER — DEXAMETHASONE SODIUM PHOSPHATE 10 MG/ML IJ SOLN
10.0000 mg | Freq: Once | INTRAMUSCULAR | Status: AC
  Administered 2016-10-22: 10 mg via INTRAVENOUS

## 2016-10-22 MED ORDER — HEPARIN SOD (PORK) LOCK FLUSH 100 UNIT/ML IV SOLN
500.0000 [IU] | Freq: Once | INTRAVENOUS | Status: AC | PRN
Start: 2016-10-22 — End: 2016-10-22
  Administered 2016-10-22: 500 [IU]
  Filled 2016-10-22: qty 5

## 2016-10-22 MED ORDER — ONDANSETRON HCL 8 MG PO TABS
8.0000 mg | ORAL_TABLET | Freq: Once | ORAL | Status: AC
  Administered 2016-10-22: 8 mg via ORAL

## 2016-10-22 MED ORDER — SODIUM CHLORIDE 0.9 % IV SOLN
800.0000 mg | Freq: Once | INTRAVENOUS | Status: AC
  Administered 2016-10-22: 800 mg via INTRAVENOUS
  Filled 2016-10-22: qty 20

## 2016-10-22 MED ORDER — CYANOCOBALAMIN 1000 MCG/ML IJ SOLN
INTRAMUSCULAR | Status: AC
  Filled 2016-10-22: qty 1

## 2016-10-22 NOTE — Patient Instructions (Signed)
[facility] Discharge Instructions for Patients Receiving Chemotherapy  Today you received the following chemotherapy agents; Alimta.   To help prevent nausea and vomiting after your treatment, we encourage you to take your nausea medication as directed.    If you develop nausea and vomiting that is not controlled by your nausea medication, call the clinic.   BELOW ARE SYMPTOMS THAT SHOULD BE REPORTED IMMEDIATELY:  *FEVER GREATER THAN 100.5 F  *CHILLS WITH OR WITHOUT FEVER  NAUSEA AND VOMITING THAT IS NOT CONTROLLED WITH YOUR NAUSEA MEDICATION  *UNUSUAL SHORTNESS OF BREATH  *UNUSUAL BRUISING OR BLEEDING  TENDERNESS IN MOUTH AND THROAT WITH OR WITHOUT PRESENCE OF ULCERS  *URINARY PROBLEMS  *BOWEL PROBLEMS  UNUSUAL RASH Items with * indicate a potential emergency and should be followed up as soon as possible.  Feel free to call the clinic you have any questions or concerns. The clinic phone number is [phone].  Please show the CHEMO ALERT CARD at check-in to the Emergency Department and triage nurse.   

## 2016-10-22 NOTE — Telephone Encounter (Signed)
Gave avs and calendar for November - January 2019

## 2016-10-22 NOTE — Progress Notes (Signed)
[place] [phone]   [phone]  OFFICE PROGRESS NOTE  Danielle Sanders, MD [place] [address]  DIAGNOSIS: Metastatic non-small cell lung cancer initially diagnosed as locally advanced stage IIIB with a right Pancoast tumor involving the vertebral body as well as prominent canal invasion with spinal cord compression in October 2007. The patient also has metastatic disease to the brain in April 2008.  PRIOR THERAPY: 1. Status post concurrent chemoradiation with weekly carboplatin and paclitaxel, last dose was given November 18, 2005. 2. 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 3. Status post gamma knife radiotherapy to a solitary brain lesion located in the superior frontal area of the brain at [facility] in April of 2008. 4. Status post palliative radiotherapy to the lateral abdominal wall metastatic lesion under the care of Dr. Lisbeth Renshaw, completed March of 2009. 5. Status post 6 cycles of systemic chemotherapy with carboplatin and Alimta. Last dose was given July 26, 2007 with disease stabilization. 6. Gamma knife stereotactic radiotherapy to 2 brain lesions one involving the right frontal dural based as well as right parietal lesion performed on 05/07/2012 under the care of Dr. Vallarie Mare at [facility].  CURRENT THERAPY: Maintenance systemic chemotherapy with Alimta 500 MG/M2 every 3 weeks, status post 158 cycles.  INTERVAL HISTORY: Danielle [name] 66 y.o. female returns to the clinic today for follow-up visit accompanied by her daughter Danielle [name]. The patient continues to feel well with no specific complaints. She is tolerating her maintenance treatment with Alimta with no significant adverse effects. She denied having any chest pain, shortness of breath, cough or hemoptysis. She denied having any fever or chills. She has no nausea, vomiting,  diarrhea or constipation. She is here today for evaluation before starting cycle #159.  MEDICAL HISTORY: Past Medical History:  Diagnosis Date  . Anemia   . 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 CVA (cerebral vascular accident) ([place]) 04/2014   pt states had 2 cva's within 2 wks  . DJD (degenerative joint disease), cervical   . Fibromyalgia   . H/O: pneumonia   . History of tobacco abuse quit 10/08   on nicotine patch  . Hypertension   . Hypokalemia   . Lung cancer ([place]) dx'd 09/2005   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 had chemo and radiation for lung ca  . Thrush 12/11/2010    ALLERGIES:  is allergic to contrast media [iodinated diagnostic agents]; iohexol; sulfa drugs cross reactors; and co-trimoxazole injection [sulfamethoxazole-trimethoprim].  MEDICATIONS:  Current Outpatient Prescriptions  Medication Sig Dispense Refill  . ALPRAZolam (XANAX) 0.25 MG tablet Take 1 tablet (0.25 mg total) by mouth at bedtime as needed. for sleep 30 tablet 0  . amLODipine (NORVASC) 10 MG tablet TAKE 1 TABLET (10 MG TOTAL) BY MOUTH DAILY. 90 tablet 1  . aspirin EC 325 MG tablet TAKE 1 TABLET BY MOUTH EVERY DAY 90 tablet 0  . atorvastatin (LIPITOR) 40 MG tablet TAKE 1 TABLET BY MOUTH EVERY DAY AT 6 PM 90 tablet 3  . diphenhydrAMINE (BENADRYL) 25 MG tablet Take 1 tablet (25 mg total) by mouth once. 2 hours before CT scan on 09/18/2015 10 tablet 0  . FeFum-FePoly-FA-B Cmp-C-Biot (INTEGRA PLUS) CAPS Take 1 capsule by mouth every morning. 90 capsule 1  . fluticasone (FLONASE) 50 MCG/ACT nasal spray Place 1-2 sprays into both nostrils daily as needed. 16 g 5  .  folic acid (FOLVITE) 1 MG tablet Take 1 tablet (1 mg total) by mouth daily. 90 tablet 1  . furosemide (LASIX) 20 MG tablet TAKE 1 TABLET (20 MG TOTAL) BY MOUTH DAILY. AS NEEDED FOR SWELLING. 20 tablet 0  . lidocaine-prilocaine (EMLA) cream Apply to Port-A-Cath as directed 1-2 hours prior to chemotherapy 30 g 1  . lisinopril  (PRINIVIL,ZESTRIL) 20 MG tablet TAKE 2 TABLETS (40 MG TOTAL) BY MOUTH DAILY. 180 tablet 1  . morphine (MS CONTIN) 60 MG 12 hr tablet Take 1 tablet (60 mg total) by mouth 2 (two) times daily. 60 tablet 0  . morphine (MSIR) 30 MG tablet Take 1 tablet (30 mg total) by mouth every 4 (four) hours as needed (breakthrough pain). 60 tablet 0  . Olopatadine HCl 0.2 % SOLN Apply 1 drop to eye daily. 2.5 mL 11  . ondansetron (ZOFRAN) 8 MG tablet TAKE 1 TABLET (8 MG TOTAL) BY MOUTH 2 (TWO) TIMES DAILY AS NEEDED FOR NAUSEA OR VOMITING. 30 tablet 0  . predniSONE (DELTASONE) 50 MG tablet 1 tablet by mouth 13 hours ,7 hours and 1 hour  before CT scan 3 tablet 1  . triamcinolone (KENALOG) 0.025 % ointment Apply 1 application topically 2 (two) times daily. 30 g 0   No current facility-administered medications for this visit.     SURGICAL HISTORY:  Past Surgical History:  Procedure Laterality Date  . CERVICAL LAMINECTOMY  1995  . KNEE SURGERY  1990   Left x 2  . 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 LOOP RECORDER IMPLANT N/A 04/19/2014   Procedure: LOOP RECORDER IMPLANT;  Surgeon: Thompson Grayer, MD;  Location: [facility] CATH LAB;  Service: Cardiovascular;  Laterality: N/A;  . OTHER SURGICAL HISTORY  2008   Gamma knife surgery to remove brain met   . PORTACATH PLACEMENT  -[name]   TIP IN CAVOATRIAL JUNCTION  . TEE WITHOUT CARDIOVERSION N/A 04/19/2014   Procedure: TRANSESOPHAGEAL ECHOCARDIOGRAM (TEE);  Surgeon: Larey Dresser, MD;  Location: [facility] ENDOSCOPY;  Service: Cardiovascular;  Laterality: N/A;    REVIEW OF SYSTEMS:  A comprehensive review of systems was negative.   PHYSICAL EXAMINATION: General appearance: alert, cooperative and no distress Head: Normocephalic, without obvious abnormality, atraumatic Neck: no adenopathy, no JVD, supple, symmetrical, trachea midline and thyroid not enlarged, symmetric, no tenderness/mass/nodules Lymph nodes: Cervical, supraclavicular, and  axillary nodes normal. Resp: clear to auscultation bilaterally Back: symmetric, no curvature. ROM normal. No CVA tenderness. Cardio: regular rate and rhythm, S1, S2 normal, no murmur, click, rub or gallop GI: soft, non-tender; bowel sounds normal; no masses,  no organomegaly Extremities: extremities normal, atraumatic, no cyanosis or edema  ECOG PERFORMANCE STATUS: 0 - Asymptomatic  Blood pressure 117/65, pulse 92, temperature 98.6 F (37 C), resp. rate 18, height 5' 3" (1.6 m), weight 128 lb 4.8 oz (58.2 kg), SpO2 100 %.  LABORATORY DATA: Lab Results  Component Value Date   WBC 8.3 10/22/2016   HGB 11.2 (L) 10/22/2016   HCT 34.0 (L) 10/22/2016   MCV 95.1 10/22/2016   PLT 397 10/22/2016      Chemistry      Component Value Date/Time   NA 141 10/01/2016 0930   K 4.5 10/01/2016 0930   CL 103 01/26/2015 0818   CL 106 06/30/2012 1004   CO2 28 10/01/2016 0930   BUN 7.8 10/01/2016 0930   CREATININE 0.8 10/01/2016 0930      Component Value Date/Time  CALCIUM 9.8 10/01/2016 0930   ALKPHOS 81 10/01/2016 0930   AST 18 10/01/2016 0930   ALT 8 10/01/2016 0930   BILITOT 0.39 10/01/2016 0930       RADIOGRAPHIC STUDIES: No results found.  ASSESSMENT AND PLAN: This is a very pleasant 66 years old white female with metastatic non-small cell lung cancer diagnosed in October 2007 status post concurrent chemoradiation followed by consolidation chemotherapy and she is currently on maintenance treatment with single agent Alimta status post 158 cycles. The patient tolerated the last cycle of her treatment fairly well. I recommended for her to proceed with cycle #159 today as scheduled. She will come back for follow-up visit in 3 weeks for evaluation before cycle 160. The patient was advised to call immediately if she has any concerning symptoms in the interval. The patient voices understanding of current disease status and treatment options and is in agreement with the current care  plan. All questions were answered. The patient knows to call the clinic with any problems, questions or concerns. We can certainly see the patient much sooner if necessary.  Disclaimer: This note was dictated with voice recognition software. Similar sounding words can inadvertently be transcribed and may not be corrected upon review.

## 2016-10-28 ENCOUNTER — Other Ambulatory Visit: Payer: Self-pay | Admitting: *Deleted

## 2016-10-28 DIAGNOSIS — C7931 Secondary malignant neoplasm of brain: Secondary | ICD-10-CM

## 2016-10-28 DIAGNOSIS — C3490 Malignant neoplasm of unspecified part of unspecified bronchus or lung: Secondary | ICD-10-CM

## 2016-10-28 DIAGNOSIS — F411 Generalized anxiety disorder: Secondary | ICD-10-CM

## 2016-10-28 DIAGNOSIS — Z5111 Encounter for antineoplastic chemotherapy: Secondary | ICD-10-CM

## 2016-10-28 DIAGNOSIS — C3411 Malignant neoplasm of upper lobe, right bronchus or lung: Secondary | ICD-10-CM

## 2016-10-28 MED ORDER — ALPRAZOLAM 0.25 MG PO TABS: 0.2500 mg | ORAL_TABLET | Freq: Every evening | ORAL | 0 refills | Status: DC | PRN

## 2016-10-28 NOTE — Telephone Encounter (Signed)
Rx foe xanax called into pt pharmacy

## 2016-11-12 ENCOUNTER — Encounter: Payer: Self-pay | Admitting: Internal Medicine

## 2016-11-12 ENCOUNTER — Ambulatory Visit (HOSPITAL_BASED_OUTPATIENT_CLINIC_OR_DEPARTMENT_OTHER): Payer: Medicare Other

## 2016-11-12 ENCOUNTER — Telehealth: Payer: Self-pay | Admitting: Internal Medicine

## 2016-11-12 ENCOUNTER — Other Ambulatory Visit (HOSPITAL_BASED_OUTPATIENT_CLINIC_OR_DEPARTMENT_OTHER): Payer: Medicare Other

## 2016-11-12 ENCOUNTER — Ambulatory Visit (HOSPITAL_BASED_OUTPATIENT_CLINIC_OR_DEPARTMENT_OTHER): Payer: Medicare Other | Admitting: Internal Medicine

## 2016-11-12 ENCOUNTER — Encounter: Payer: Self-pay | Admitting: *Deleted

## 2016-11-12 DIAGNOSIS — C7931 Secondary malignant neoplasm of brain: Secondary | ICD-10-CM

## 2016-11-12 DIAGNOSIS — Z5111 Encounter for antineoplastic chemotherapy: Secondary | ICD-10-CM | POA: Diagnosis not present

## 2016-11-12 DIAGNOSIS — C3490 Malignant neoplasm of unspecified part of unspecified bronchus or lung: Secondary | ICD-10-CM

## 2016-11-12 DIAGNOSIS — Z452 Encounter for adjustment and management of vascular access device: Secondary | ICD-10-CM

## 2016-11-12 DIAGNOSIS — C3491 Malignant neoplasm of unspecified part of right bronchus or lung: Secondary | ICD-10-CM

## 2016-11-12 DIAGNOSIS — C3411 Malignant neoplasm of upper lobe, right bronchus or lung: Secondary | ICD-10-CM

## 2016-11-12 DIAGNOSIS — C3410 Malignant neoplasm of upper lobe, unspecified bronchus or lung: Secondary | ICD-10-CM

## 2016-11-12 LAB — CBC WITH DIFFERENTIAL/PLATELET
BASO%: 0.4 % (ref 0.0–2.0)
Basophils Absolute: 0 10e3/uL (ref 0.0–0.1)
EOS%: 1.3 % (ref 0.0–7.0)
Eosinophils Absolute: 0.1 10e3/uL (ref 0.0–0.5)
HCT: 35.3 % (ref 34.8–46.6)
HGB: 11.7 g/dL (ref 11.6–15.9)
LYMPH%: 17.5 % (ref 14.0–49.7)
MCH: 31.7 pg (ref 25.1–34.0)
MCHC: 33.1 g/dL (ref 31.5–36.0)
MCV: 95.7 fL (ref 79.5–101.0)
MONO#: 0.9 10e3/uL (ref 0.1–0.9)
MONO%: 10.3 % (ref 0.0–14.0)
NEUT#: 5.9 10e3/uL (ref 1.5–6.5)
NEUT%: 70.5 % (ref 38.4–76.8)
Platelets: 365 10e3/uL (ref 145–400)
RBC: 3.69 10e6/uL — ABNORMAL LOW (ref 3.70–5.45)
RDW: 15.7 % — ABNORMAL HIGH (ref 11.2–14.5)
WBC: 8.4 10e3/uL (ref 3.9–10.3)
lymph#: 1.5 10e3/uL (ref 0.9–3.3)

## 2016-11-12 LAB — COMPREHENSIVE METABOLIC PANEL WITH GFR
ALT: 10 U/L (ref 0–55)
AST: 20 U/L (ref 5–34)
Albumin: 3.4 g/dL — ABNORMAL LOW (ref 3.5–5.0)
Alkaline Phosphatase: 74 U/L (ref 40–150)
Anion Gap: 8 meq/L (ref 3–11)
BUN: 12.7 mg/dL (ref 7.0–26.0)
CO2: 28 meq/L (ref 22–29)
Calcium: 9.8 mg/dL (ref 8.4–10.4)
Chloride: 107 meq/L (ref 98–109)
Creatinine: 0.9 mg/dL (ref 0.6–1.1)
EGFR: >60 ml/min/1.73 m2
Glucose: 135 mg/dL (ref 70–140)
Potassium: 4.3 meq/L (ref 3.5–5.1)
Sodium: 143 meq/L (ref 136–145)
Total Bilirubin: 0.52 mg/dL (ref 0.20–1.20)
Total Protein: 7.3 g/dL (ref 6.4–8.3)

## 2016-11-12 MED ORDER — HEPARIN SOD (PORK) LOCK FLUSH 100 UNIT/ML IV SOLN
500.0000 [IU] | Freq: Once | INTRAVENOUS | Status: AC | PRN
  Administered 2016-11-12: 500 [IU]
  Filled 2016-11-12: qty 5

## 2016-11-12 MED ORDER — ONDANSETRON HCL 8 MG PO TABS
ORAL_TABLET | ORAL | Status: AC
  Filled 2016-11-12: qty 1

## 2016-11-12 MED ORDER — MORPHINE SULFATE 30 MG PO TABS: 30.0000 mg | ORAL_TABLET | ORAL | 0 refills | Status: DC | PRN

## 2016-11-12 MED ORDER — ALTEPLASE 2 MG IJ SOLR
2.0000 mg | Freq: Once | INTRAMUSCULAR | Status: AC | PRN
  Administered 2016-11-12: 2 mg
  Filled 2016-11-12: qty 2

## 2016-11-12 MED ORDER — SODIUM CHLORIDE 0.9 % IJ SOLN
10.0000 mL | INTRAMUSCULAR | Status: DC | PRN
  Administered 2016-11-12: 10 mL
  Filled 2016-11-12: qty 10

## 2016-11-12 MED ORDER — MORPHINE SULFATE ER 60 MG PO TBCR: 60.0000 mg | EXTENDED_RELEASE_TABLET | Freq: Two times a day (BID) | ORAL | 0 refills | Status: DC

## 2016-11-12 MED ORDER — SODIUM CHLORIDE 0.9 % IV SOLN
Freq: Once | INTRAVENOUS | Status: AC
  Administered 2016-11-12: 11:00:00 via INTRAVENOUS

## 2016-11-12 MED ORDER — DEXAMETHASONE SODIUM PHOSPHATE 10 MG/ML IJ SOLN
INTRAMUSCULAR | Status: AC
  Filled 2016-11-12: qty 1

## 2016-11-12 MED ORDER — DEXAMETHASONE SODIUM PHOSPHATE 10 MG/ML IJ SOLN
10.0000 mg | Freq: Once | INTRAMUSCULAR | Status: AC
  Administered 2016-11-12: 10 mg via INTRAVENOUS

## 2016-11-12 MED ORDER — SODIUM CHLORIDE 0.9 % IV SOLN
800.0000 mg | Freq: Once | INTRAVENOUS | Status: AC
  Administered 2016-11-12: 800 mg via INTRAVENOUS
  Filled 2016-11-12: qty 20

## 2016-11-12 MED ORDER — ONDANSETRON HCL 8 MG PO TABS
8.0000 mg | ORAL_TABLET | Freq: Once | ORAL | Status: AC
Start: 2016-11-12 — End: 2016-11-12
  Administered 2016-11-12: 8 mg via ORAL

## 2016-11-12 NOTE — Patient Instructions (Signed)
[place] Discharge Instructions for Patients Receiving Chemotherapy  Today you received the following chemotherapy agents Alimta To help prevent nausea and vomiting after your treatment, we encourage you to take your nausea medication as directed  If you develop nausea and vomiting that is not controlled by your nausea medication, call the clinic.   BELOW ARE SYMPTOMS THAT SHOULD BE REPORTED IMMEDIATELY:  *FEVER GREATER THAN 100.5 F  *CHILLS WITH OR WITHOUT FEVER  NAUSEA AND VOMITING THAT IS NOT CONTROLLED WITH YOUR NAUSEA MEDICATION  *UNUSUAL SHORTNESS OF BREATH  *UNUSUAL BRUISING OR BLEEDING  TENDERNESS IN MOUTH AND THROAT WITH OR WITHOUT PRESENCE OF ULCERS  *URINARY PROBLEMS  *BOWEL PROBLEMS  UNUSUAL RASH Items with * indicate a potential emergency and should be followed up as soon as possible.  Feel free to call the clinic should you have any questions or concerns. The clinic phone number is [phone].  Please show the [place] at check-in to the Emergency Department and triage nurse.

## 2016-11-12 NOTE — Telephone Encounter (Signed)
Gave avs and calendar for November - January 2019

## 2016-11-12 NOTE — Progress Notes (Signed)
[place] Telephone:([phone]   [phone]  OFFICE PROGRESS NOTE  [name], MD [place] [address]  DIAGNOSIS: Metastatic non-small cell lung cancer initially diagnosed as locally advanced stage IIIB with a right Pancoast tumor involving the vertebral body as well as prominent canal invasion with spinal cord compression in October 2007. The patient also has metastatic disease to the brain in April 2008.  PRIOR THERAPY: 1. Status post concurrent chemoradiation with weekly carboplatin and paclitaxel, last dose was given November 18, 2005. 2. 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 3. Status post gamma knife radiotherapy to a solitary brain lesion located in the superior frontal area of the brain at [facility] in April of 2008. 4. Status post palliative radiotherapy to the lateral abdominal wall metastatic lesion under the care of Dr. Lisbeth Renshaw, completed March of 2009. 5. Status post 6 cycles of systemic chemotherapy with carboplatin and Alimta. Last dose was given July 26, 2007 with disease stabilization. 6. Gamma knife stereotactic radiotherapy to 2 brain lesions one involving the right frontal dural based as well as right parietal lesion performed on 05/07/2012 under the care of Dr. Vallarie Mare at [facility].  CURRENT THERAPY: Maintenance systemic chemotherapy with Alimta 500 MG/M2 every 3 weeks, status post 159 cycles.  INTERVAL HISTORY: Danielle [name] 66 y.o. female returns to the clinic today for follow-up visit accompanied by her sister and daughter.  The patient is feeling fine today with no specific complaints.  She denied having any chest pain, shortness of breath, cough or hemoptysis.  She has no recent weight loss or night sweats.  She has no nausea, vomiting, diarrhea or constipation.  She has been tolerating her treatment with Alimta  fairly well.  She is here today for evaluation before starting cycle #160.  MEDICAL HISTORY: Past Medical History:  Diagnosis Date  . Anemia   . 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 CVA (cerebral vascular accident) ([place]) 04/2014   pt states had 2 cva's within 2 wks  . DJD (degenerative joint disease), cervical   . Fibromyalgia   . H/O: pneumonia   . History of tobacco abuse quit 10/08   on nicotine patch  . Hypertension   . Hypokalemia   . Lung cancer ([place]) dx'd 09/2005   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 had chemo and radiation for lung ca  . Thrush 12/11/2010    ALLERGIES:  is allergic to contrast media [iodinated diagnostic agents]; iohexol; sulfa drugs cross reactors; and co-trimoxazole injection [sulfamethoxazole-trimethoprim].  MEDICATIONS:  Current Outpatient Medications  Medication Sig Dispense Refill  . ALPRAZolam (XANAX) 0.25 MG tablet Take 1 tablet (0.25 mg total) by mouth at bedtime as needed. for sleep 30 tablet 0  . amLODipine (NORVASC) 10 MG tablet TAKE 1 TABLET (10 MG TOTAL) BY MOUTH DAILY. 90 tablet 1  . aspirin EC 325 MG tablet TAKE 1 TABLET BY MOUTH EVERY DAY 90 tablet 0  . atorvastatin (LIPITOR) 40 MG tablet TAKE 1 TABLET BY MOUTH EVERY DAY AT 6 PM 90 tablet 3  . diphenhydrAMINE (BENADRYL) 25 MG tablet Take 1 tablet (25 mg total) by mouth once. 2 hours before CT scan on 09/18/2015 10 tablet 0  . FeFum-FePoly-FA-B Cmp-C-Biot (INTEGRA PLUS) CAPS Take 1 capsule by mouth every morning. 90 capsule 1  . fluticasone (FLONASE) 50 MCG/ACT nasal spray Place 1-2 sprays into both nostrils  daily as needed. 16 g 5  . folic acid (FOLVITE) 1 MG tablet Take 1 tablet (1 mg total) by mouth daily. 90 tablet 1  . furosemide (LASIX) 20 MG tablet TAKE 1 TABLET (20 MG TOTAL) BY MOUTH DAILY. AS NEEDED FOR SWELLING. 20 tablet 0  . lidocaine-prilocaine (EMLA) cream Apply to Port-A-Cath as directed 1-2 hours prior to chemotherapy 30 g 1  . lisinopril  (PRINIVIL,ZESTRIL) 20 MG tablet TAKE 2 TABLETS (40 MG TOTAL) BY MOUTH DAILY. 180 tablet 1  . morphine (MS CONTIN) 60 MG 12 hr tablet Take 1 tablet (60 mg total) by mouth 2 (two) times daily. 60 tablet 0  . morphine (MSIR) 30 MG tablet Take 1 tablet (30 mg total) by mouth every 4 (four) hours as needed (breakthrough pain). 60 tablet 0  . Olopatadine HCl 0.2 % SOLN Apply 1 drop to eye daily. 2.5 mL 11  . ondansetron (ZOFRAN) 8 MG tablet TAKE 1 TABLET (8 MG TOTAL) BY MOUTH 2 (TWO) TIMES DAILY AS NEEDED FOR NAUSEA OR VOMITING. 30 tablet 0  . predniSONE (DELTASONE) 50 MG tablet 1 tablet by mouth 13 hours ,7 hours and 1 hour  before CT scan 3 tablet 1  . triamcinolone (KENALOG) 0.025 % ointment Apply 1 application topically 2 (two) times daily. 30 g 0   No current facility-administered medications for this visit.     SURGICAL HISTORY:  Past Surgical History:  Procedure Laterality Date  . CERVICAL LAMINECTOMY  1995  . KNEE SURGERY  1990   Left x 2  . 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 OTHER SURGICAL HISTORY  2008   Gamma knife surgery to remove brain met   . PORTACATH PLACEMENT  -[name]   TIP IN CAVOATRIAL JUNCTION    REVIEW OF SYSTEMS:  A comprehensive review of systems was negative.   PHYSICAL EXAMINATION: General appearance: alert, cooperative and no distress Head: Normocephalic, without obvious abnormality, atraumatic Neck: no adenopathy, no JVD, supple, symmetrical, trachea midline and thyroid not enlarged, symmetric, no tenderness/mass/nodules Lymph nodes: Cervical, supraclavicular, and axillary nodes normal. Resp: clear to auscultation bilaterally Back: symmetric, no curvature. ROM normal. No CVA tenderness. Cardio: regular rate and rhythm, S1, S2 normal, no murmur, click, rub or gallop GI: soft, non-tender; bowel sounds normal; no masses,  no organomegaly Extremities: extremities normal, atraumatic, no cyanosis or edema  ECOG PERFORMANCE  STATUS: 0 - Asymptomatic  Blood pressure (!) 127/57, pulse 95, temperature 99 F (37.2 C), temperature source Oral, resp. rate 18, height _0  (1.6 m), weight 125 lb 4.8 oz (56.8 kg), SpO2 95 %.  LABORATORY DATA: Lab Results  Component Value Date   WBC 8.4 11/12/2016   HGB 11.7 11/12/2016   HCT 35.3 11/12/2016   MCV 95.7 11/12/2016   PLT 365 11/12/2016      Chemistry      Component Value Date/Time   NA 141 10/22/2016 0811   K 4.5 10/22/2016 0811   CL 103 01/26/2015 0818   CL 106 06/30/2012 1004   CO2 24 10/22/2016 0811   BUN 12.8 10/22/2016 0811   CREATININE 0.9 10/22/2016 0811      Component Value Date/Time   CALCIUM 9.4 10/22/2016 0811   ALKPHOS 74 10/22/2016 0811   AST 23 10/22/2016 0811   ALT 12 10/22/2016 0811   BILITOT 0.44 10/22/2016 0811       RADIOGRAPHIC STUDIES: No results found.  ASSESSMENT AND PLAN: This is a very  pleasant 66 years old white female with metastatic non-small cell lung cancer diagnosed in October 2007 status post concurrent chemoradiation followed by consolidation chemotherapy and she is currently on maintenance treatment with single agent Alimta status post 159 cycles. The patient continues to tolerate her treatment fairly well with no significant adverse effects. I recommended for her to proceed with cycle #160 today as a schedule. I will see her back for follow-up visit in 3 weeks for evaluation before starting the next cycle of her treatment. She was advised to call immediately if she has any concerning symptoms in the interval. The patient voices understanding of current disease status and treatment options and is in agreement with the current care plan. All questions were answered. The patient knows to call the clinic with any problems, questions or concerns. We can certainly see the patient much sooner if necessary.  Disclaimer: This note was dictated with voice recognition software. Similar sounding words can inadvertently be  transcribed and may not be corrected upon review.

## 2016-11-13 ENCOUNTER — Other Ambulatory Visit: Payer: Self-pay | Admitting: Internal Medicine

## 2016-11-17 ENCOUNTER — Other Ambulatory Visit: Payer: Self-pay | Admitting: Internal Medicine

## 2016-11-17 DIAGNOSIS — C7931 Secondary malignant neoplasm of brain: Secondary | ICD-10-CM

## 2016-11-25 ENCOUNTER — Other Ambulatory Visit: Payer: Self-pay | Admitting: *Deleted

## 2016-11-25 DIAGNOSIS — C3411 Malignant neoplasm of upper lobe, right bronchus or lung: Secondary | ICD-10-CM

## 2016-11-27 ENCOUNTER — Other Ambulatory Visit: Payer: Self-pay | Admitting: Medical Oncology

## 2016-11-27 DIAGNOSIS — F411 Generalized anxiety disorder: Secondary | ICD-10-CM

## 2016-11-27 DIAGNOSIS — C3411 Malignant neoplasm of upper lobe, right bronchus or lung: Secondary | ICD-10-CM

## 2016-11-27 DIAGNOSIS — Z5111 Encounter for antineoplastic chemotherapy: Secondary | ICD-10-CM

## 2016-11-27 DIAGNOSIS — C7931 Secondary malignant neoplasm of brain: Secondary | ICD-10-CM

## 2016-11-27 DIAGNOSIS — C3490 Malignant neoplasm of unspecified part of unspecified bronchus or lung: Secondary | ICD-10-CM

## 2016-11-27 MED ORDER — ALPRAZOLAM 0.25 MG PO TABS: 0.2500 mg | ORAL_TABLET | Freq: Every evening | ORAL | 0 refills | Status: DC | PRN

## 2016-12-03 ENCOUNTER — Encounter: Payer: Self-pay | Admitting: Internal Medicine

## 2016-12-03 ENCOUNTER — Telehealth: Payer: Self-pay | Admitting: Internal Medicine

## 2016-12-03 ENCOUNTER — Ambulatory Visit (HOSPITAL_BASED_OUTPATIENT_CLINIC_OR_DEPARTMENT_OTHER): Payer: Medicare Other | Admitting: Internal Medicine

## 2016-12-03 ENCOUNTER — Other Ambulatory Visit: Payer: Self-pay | Admitting: Internal Medicine

## 2016-12-03 ENCOUNTER — Other Ambulatory Visit (HOSPITAL_BASED_OUTPATIENT_CLINIC_OR_DEPARTMENT_OTHER): Payer: Medicare Other

## 2016-12-03 ENCOUNTER — Ambulatory Visit (HOSPITAL_BASED_OUTPATIENT_CLINIC_OR_DEPARTMENT_OTHER): Payer: Medicare Other

## 2016-12-03 VITALS — BP 116/56 | HR 86 | Temp 98.2°F | Resp 20 | Ht 63.0 in | Wt 122.6 lb

## 2016-12-03 DIAGNOSIS — C3411 Malignant neoplasm of upper lobe, right bronchus or lung: Secondary | ICD-10-CM

## 2016-12-03 DIAGNOSIS — C7989 Secondary malignant neoplasm of other specified sites: Secondary | ICD-10-CM | POA: Diagnosis not present

## 2016-12-03 DIAGNOSIS — Z9221 Personal history of antineoplastic chemotherapy: Secondary | ICD-10-CM

## 2016-12-03 DIAGNOSIS — E119 Type 2 diabetes mellitus without complications: Secondary | ICD-10-CM

## 2016-12-03 DIAGNOSIS — C3491 Malignant neoplasm of unspecified part of right bronchus or lung: Secondary | ICD-10-CM

## 2016-12-03 DIAGNOSIS — Z5111 Encounter for antineoplastic chemotherapy: Secondary | ICD-10-CM

## 2016-12-03 DIAGNOSIS — Z923 Personal history of irradiation: Secondary | ICD-10-CM | POA: Diagnosis not present

## 2016-12-03 DIAGNOSIS — I10 Essential (primary) hypertension: Secondary | ICD-10-CM

## 2016-12-03 DIAGNOSIS — C3490 Malignant neoplasm of unspecified part of unspecified bronchus or lung: Secondary | ICD-10-CM

## 2016-12-03 DIAGNOSIS — Z85118 Personal history of other malignant neoplasm of bronchus and lung: Secondary | ICD-10-CM

## 2016-12-03 DIAGNOSIS — D6481 Anemia due to antineoplastic chemotherapy: Secondary | ICD-10-CM

## 2016-12-03 DIAGNOSIS — E7800 Pure hypercholesterolemia, unspecified: Secondary | ICD-10-CM

## 2016-12-03 DIAGNOSIS — I700 Atherosclerosis of aorta: Secondary | ICD-10-CM

## 2016-12-03 DIAGNOSIS — A420 Pulmonary actinomycosis: Secondary | ICD-10-CM

## 2016-12-03 DIAGNOSIS — G939 Disorder of brain, unspecified: Secondary | ICD-10-CM

## 2016-12-03 DIAGNOSIS — I63412 Cerebral infarction due to embolism of left middle cerebral artery: Secondary | ICD-10-CM

## 2016-12-03 DIAGNOSIS — C7931 Secondary malignant neoplasm of brain: Secondary | ICD-10-CM

## 2016-12-03 DIAGNOSIS — I639 Cerebral infarction, unspecified: Secondary | ICD-10-CM

## 2016-12-03 DIAGNOSIS — T451X5A Adverse effect of antineoplastic and immunosuppressive drugs, initial encounter: Secondary | ICD-10-CM

## 2016-12-03 DIAGNOSIS — G893 Neoplasm related pain (acute) (chronic): Secondary | ICD-10-CM

## 2016-12-03 DIAGNOSIS — Z87898 Personal history of other specified conditions: Secondary | ICD-10-CM

## 2016-12-03 LAB — CBC WITH DIFFERENTIAL/PLATELET
BASO%: 0.7 % (ref 0.0–2.0)
BASOS ABS: 0.1 10*3/uL (ref 0.0–0.1)
EOS%: 0.7 % (ref 0.0–7.0)
Eosinophils Absolute: 0.1 10*3/uL (ref 0.0–0.5)
HEMATOCRIT: 38.6 % (ref 34.8–46.6)
HGB: 12.6 g/dL (ref 11.6–15.9)
LYMPH#: 1.9 10*3/uL (ref 0.9–3.3)
LYMPH%: 17.8 % (ref 14.0–49.7)
MCH: 31.3 pg (ref 25.1–34.0)
MCHC: 32.6 g/dL (ref 31.5–36.0)
MCV: 96.1 fL (ref 79.5–101.0)
MONO#: 0.9 10*3/uL (ref 0.1–0.9)
MONO%: 8.6 % (ref 0.0–14.0)
NEUT#: 7.6 10*3/uL — ABNORMAL HIGH (ref 1.5–6.5)
NEUT%: 72.2 % (ref 38.4–76.8)
Platelets: 503 10*3/uL — ABNORMAL HIGH (ref 145–400)
RBC: 4.01 10*6/uL (ref 3.70–5.45)
RDW: 15.3 % — ABNORMAL HIGH (ref 11.2–14.5)
WBC: 10.5 10*3/uL — ABNORMAL HIGH (ref 3.9–10.3)

## 2016-12-03 LAB — COMPREHENSIVE METABOLIC PANEL
ALT: 12 U/L (ref 0–55)
ANION GAP: 12 meq/L — AB (ref 3–11)
AST: 22 U/L (ref 5–34)
Albumin: 3.4 g/dL — ABNORMAL LOW (ref 3.5–5.0)
Alkaline Phosphatase: 75 U/L (ref 40–150)
BUN: 12.7 mg/dL (ref 7.0–26.0)
CALCIUM: 9.9 mg/dL (ref 8.4–10.4)
CHLORIDE: 103 meq/L (ref 98–109)
CO2: 26 meq/L (ref 22–29)
Creatinine: 1 mg/dL (ref 0.6–1.1)
EGFR: 57 mL/min/{1.73_m2} — ABNORMAL LOW (ref >60)
Glucose: 119 mg/dl (ref 70–140)
POTASSIUM: 3.8 meq/L (ref 3.5–5.1)
Sodium: 141 mEq/L (ref 136–145)
Total Bilirubin: 0.4 mg/dL (ref 0.20–1.20)
Total Protein: 7.7 g/dL (ref 6.4–8.3)

## 2016-12-03 LAB — RESEARCH LABS

## 2016-12-03 MED ORDER — DEXAMETHASONE SODIUM PHOSPHATE 10 MG/ML IJ SOLN
10.0000 mg | Freq: Once | INTRAMUSCULAR | Status: AC
  Administered 2016-12-03: 10 mg via INTRAVENOUS

## 2016-12-03 MED ORDER — ONDANSETRON HCL 8 MG PO TABS
8.0000 mg | ORAL_TABLET | Freq: Once | ORAL | Status: AC
  Administered 2016-12-03: 8 mg via ORAL

## 2016-12-03 MED ORDER — SODIUM CHLORIDE 0.9 % IJ SOLN
10.0000 mL | INTRAMUSCULAR | Status: DC | PRN
  Administered 2016-12-03: 10 mL
  Filled 2016-12-03: qty 10

## 2016-12-03 MED ORDER — SODIUM CHLORIDE 0.9 % IV SOLN
Freq: Once | INTRAVENOUS | Status: AC
  Administered 2016-12-03: 10:00:00 via INTRAVENOUS

## 2016-12-03 MED ORDER — SODIUM CHLORIDE 0.9 % IV SOLN
500.0000 mg/m2 | Freq: Once | INTRAVENOUS | Status: AC
  Administered 2016-12-03: 800 mg via INTRAVENOUS
  Filled 2016-12-03: qty 20

## 2016-12-03 MED ORDER — DEXAMETHASONE SODIUM PHOSPHATE 10 MG/ML IJ SOLN
INTRAMUSCULAR | Status: AC
  Filled 2016-12-03: qty 1

## 2016-12-03 MED ORDER — ONDANSETRON HCL 8 MG PO TABS
ORAL_TABLET | ORAL | Status: AC
  Filled 2016-12-03: qty 1

## 2016-12-03 MED ORDER — HEPARIN SOD (PORK) LOCK FLUSH 100 UNIT/ML IV SOLN
500.0000 [IU] | Freq: Once | INTRAVENOUS | Status: AC | PRN
  Administered 2016-12-03: 500 [IU]
  Filled 2016-12-03: qty 5

## 2016-12-03 NOTE — Telephone Encounter (Signed)
Gave avs and calendar for January 2019 °

## 2016-12-03 NOTE — Patient Instructions (Signed)
[place] Discharge Instructions for Patients Receiving Chemotherapy  Today you received the following chemotherapy agents Alimta To help prevent nausea and vomiting after your treatment, we encourage you to take your nausea medicationas prescribed.  If you develop nausea and vomiting that is not controlled by your nausea medication, call the clinic.   BELOW ARE SYMPTOMS THAT SHOULD BE REPORTED IMMEDIATELY:  *FEVER GREATER THAN 100.5 F  *CHILLS WITH OR WITHOUT FEVER  NAUSEA AND VOMITING THAT IS NOT CONTROLLED WITH YOUR NAUSEA MEDICATION  *UNUSUAL SHORTNESS OF BREATH  *UNUSUAL BRUISING OR BLEEDING  TENDERNESS IN MOUTH AND THROAT WITH OR WITHOUT PRESENCE OF ULCERS  *URINARY PROBLEMS  *BOWEL PROBLEMS  UNUSUAL RASH Items with * indicate a potential emergency and should be followed up as soon as possible.  Feel free to call the clinic should you have any questions or concerns. The clinic phone number is [phone].  Please show the [place] at check-in to the Emergency Department and triage nurse.

## 2016-12-03 NOTE — Progress Notes (Signed)
[facility] Telephone:([phone]) [phone]   [phone]) [phone]  OFFICE PROGRESS NOTE  [name], MD [place] [address]  DIAGNOSIS: Metastatic non-small cell lung cancer initially diagnosed as locally advanced stage IIIB with a right Pancoast tumor involving the vertebral body as well as prominent canal invasion with spinal cord compression in October 2007. The patient also has metastatic disease to the brain in April 2008.  PRIOR THERAPY: 1. Status post concurrent chemoradiation with weekly carboplatin and paclitaxel, last dose was given November 18, 2005. 2. 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 3. Status post gamma knife radiotherapy to a solitary brain lesion located in the superior frontal area of the brain at [facility] in April of 2008. 4. Status post palliative radiotherapy to the lateral abdominal wall metastatic lesion under the care of Dr. Lisbeth Renshaw, completed March of 2009. 5. Status post 6 cycles of systemic chemotherapy with carboplatin and Alimta. Last dose was given July 26, 2007 with disease stabilization. 6. Gamma knife stereotactic radiotherapy to 2 brain lesions one involving the right frontal dural based as well as right parietal lesion performed on 05/07/2012 under the care of Dr. Vallarie Mare at [facility].  CURRENT THERAPY: Maintenance systemic chemotherapy with Alimta 500 MG/M2 every 3 weeks, status post 160 cycles.  INTERVAL HISTORY: Danielle [name] 66 y.o. female returns to the clinic today for follow-up visit accompanied by her daughter.  The patient is feeling fine today with no specific complaints.  She had a good Thanksgiving holiday with her family.  She denied having any recent chest pain, shortness of breath, cough or hemoptysis.  She has no nausea, vomiting, diarrhea or constipation.  She has no fever or chills.  She has no  significant weight loss or night sweats.  She is here today for evaluation before starting cycle 161.  MEDICAL HISTORY: Past Medical History:  Diagnosis Date  . Anemia   . 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 CVA (cerebral vascular accident) ([place]) 04/2014   pt states had 2 cva's within 2 wks  . DJD (degenerative joint disease), cervical   . Fibromyalgia   . H/O: pneumonia   . History of tobacco abuse quit 10/08   on nicotine patch  . Hypertension   . Hypokalemia   . Lung cancer ([place]) dx'd 09/2005   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 had chemo and radiation for lung ca  . Thrush 12/11/2010    ALLERGIES:  is allergic to contrast media [iodinated diagnostic agents]; iohexol; sulfa drugs cross reactors; and co-trimoxazole injection [sulfamethoxazole-trimethoprim].  MEDICATIONS:  Current Outpatient Medications  Medication Sig Dispense Refill  . ALPRAZolam (XANAX) 0.25 MG tablet Take 1 tablet (0.25 mg total) by mouth at bedtime as needed. for sleep 30 tablet 0  . amLODipine (NORVASC) 10 MG tablet TAKE 1 TABLET (10 MG TOTAL) BY MOUTH DAILY. 90 tablet 1  . aspirin EC 325 MG tablet TAKE 1 TABLET BY MOUTH EVERY DAY 90 tablet 0  . atorvastatin (LIPITOR) 40 MG tablet TAKE 1 TABLET BY MOUTH EVERY DAY AT 6 PM 90 tablet 3  . bupivacaine-EPINEPHrine (MARCAINE W/ EPI) 0.25% -1:200000 SOLN Inject 15 mLs into the skin.    Marland Kitchen FeFum-FePoly-FA-B Cmp-C-Biot (INTEGRA PLUS) CAPS Take 1 capsule by mouth every morning. 90 capsule 1  . fluticasone (FLONASE) 50 MCG/ACT nasal spray Place 1-2 sprays into both nostrils daily as needed. [place]  g 5  . folic acid (FOLVITE) 1 MG tablet Take 1 tablet (1 mg total) by mouth daily. 90 tablet 1  . furosemide (LASIX) 20 MG tablet TAKE 1 TABLET (20 MG TOTAL) BY MOUTH DAILY. AS NEEDED FOR SWELLING. 20 tablet 0  . lacosamide (VIMPAT) 50 MG TABS tablet Take by mouth.    . lidocaine (XYLOCAINE) 2 % injection Inject 15 mLs into the skin.    Marland Kitchen  lidocaine-prilocaine (EMLA) cream Apply to Port-A-Cath as directed 1-2 hours prior to chemotherapy 30 g 1  . lisinopril (PRINIVIL,ZESTRIL) 20 MG tablet TAKE 2 TABLETS (40 MG TOTAL) BY MOUTH DAILY. 180 tablet 1  . morphine (MS CONTIN) 60 MG 12 hr tablet Take 1 tablet (60 mg total) 2 (two) times daily by mouth. 60 tablet 0  . morphine (MSIR) 30 MG tablet Take 1 tablet (30 mg total) every 4 (four) hours as needed by mouth (breakthrough pain). 60 tablet 0  . Olopatadine HCl 0.2 % SOLN Apply 1 drop to eye daily. 2.5 mL 11  . ondansetron (ZOFRAN) 8 MG tablet TAKE 1 TABLET (8 MG TOTAL) BY MOUTH 2 (TWO) TIMES DAILY AS NEEDED FOR NAUSEA OR VOMITING. 30 tablet 0  . predniSONE (DELTASONE) 50 MG tablet 1 tablet by mouth 13 hours ,7 hours and 1 hour  before CT scan 3 tablet 1  . prochlorperazine (COMPAZINE) 10 MG tablet Take 10 mg by mouth.    . triamcinolone (KENALOG) 0.025 % ointment Apply 1 application topically 2 (two) times daily. 30 g 0  . diphenhydrAMINE (BENADRYL) 25 MG tablet Take 1 tablet (25 mg total) by mouth once. 2 hours before CT scan on 09/18/2015 10 tablet 0   No current facility-administered medications for this visit.     SURGICAL HISTORY:  Past Surgical History:  Procedure Laterality Date  . CERVICAL LAMINECTOMY  1995  . KNEE SURGERY  1990   Left x 2  . 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 LOOP RECORDER IMPLANT N/A 04/19/2014   Procedure: LOOP RECORDER IMPLANT;  Surgeon: Thompson Grayer, MD;  Location: [facility] CATH LAB;  Service: Cardiovascular;  Laterality: N/A;  . OTHER SURGICAL HISTORY  2008   Gamma knife surgery to remove brain met   . PORTACATH PLACEMENT  -[name]   TIP IN CAVOATRIAL JUNCTION  . TEE WITHOUT CARDIOVERSION N/A 04/19/2014   Procedure: TRANSESOPHAGEAL ECHOCARDIOGRAM (TEE);  Surgeon: Larey Dresser, MD;  Location: [facility] ENDOSCOPY;  Service: Cardiovascular;  Laterality: N/A;    REVIEW OF SYSTEMS:  A comprehensive review of systems was  negative.   PHYSICAL EXAMINATION: General appearance: alert, cooperative and no distress Head: Normocephalic, without obvious abnormality, atraumatic Neck: no adenopathy, no JVD, supple, symmetrical, trachea midline and thyroid not enlarged, symmetric, no tenderness/mass/nodules Lymph nodes: Cervical, supraclavicular, and axillary nodes normal. Resp: clear to auscultation bilaterally Back: symmetric, no curvature. ROM normal. No CVA tenderness. Cardio: regular rate and rhythm, S1, S2 normal, no murmur, click, rub or gallop GI: soft, non-tender; bowel sounds normal; no masses,  no organomegaly Extremities: extremities normal, atraumatic, no cyanosis or edema  ECOG PERFORMANCE STATUS: 0 - Asymptomatic  Blood pressure (!) 116/56, pulse 86, temperature 98.2 F (36.8 C), temperature source Oral, resp. rate 20, height '5\' 3"'  (1.6 m), weight 122 lb 9.6 oz (55.6 kg), SpO2 97 %.  LABORATORY DATA: Lab Results  Component Value Date   WBC 10.5 (H) 12/03/2016   HGB 12.6 12/03/2016   HCT 38.6 12/03/2016  MCV 96.1 12/03/2016   PLT 503 (H) 12/03/2016      Chemistry      Component Value Date/Time   NA 143 11/12/2016 0759   K 4.3 11/12/2016 0759   CL 103 01/26/2015 0818   CL 106 06/30/2012 1004   CO2 28 11/12/2016 0759   BUN 12.7 11/12/2016 0759   CREATININE 0.9 11/12/2016 0759      Component Value Date/Time   CALCIUM 9.8 11/12/2016 0759   ALKPHOS 74 11/12/2016 0759   AST 20 11/12/2016 0759   ALT 10 11/12/2016 0759   BILITOT 0.52 11/12/2016 0759       RADIOGRAPHIC STUDIES: No results found.  ASSESSMENT AND PLAN: This is a very pleasant 66 years old white female with metastatic non-small cell lung cancer diagnosed in October 2007 status post concurrent chemoradiation followed by consolidation chemotherapy and she is currently on maintenance treatment with single agent Alimta status post 160 cycles. The patient tolerated the last cycle of her treatment well. I recommended for her to  proceed with cycle 161 today. I will see her back for follow-up visit in 3 weeks for evaluation before the next cycle of her treatment. She was advised to call immediately if she has any concerning symptoms in the interval. The patient voices understanding of current disease status and treatment options and is in agreement with the current care plan. All questions were answered. The patient knows to call the clinic with any problems, questions or concerns. We can certainly see the patient much sooner if necessary.  Disclaimer: This note was dictated with voice recognition software. Similar sounding words can inadvertently be transcribed and may not be corrected upon review.

## 2016-12-12 ENCOUNTER — Other Ambulatory Visit: Payer: Self-pay | Admitting: Medical Oncology

## 2016-12-12 DIAGNOSIS — C3410 Malignant neoplasm of upper lobe, unspecified bronchus or lung: Secondary | ICD-10-CM

## 2016-12-12 DIAGNOSIS — C3490 Malignant neoplasm of unspecified part of unspecified bronchus or lung: Secondary | ICD-10-CM

## 2016-12-12 MED ORDER — MORPHINE SULFATE 30 MG PO TABS: 30.0000 mg | ORAL_TABLET | ORAL | 0 refills | Status: DC | PRN

## 2016-12-12 MED ORDER — MORPHINE SULFATE ER 60 MG PO TBCR: 60.0000 mg | EXTENDED_RELEASE_TABLET | Freq: Two times a day (BID) | ORAL | 0 refills | Status: DC

## 2016-12-12 NOTE — Progress Notes (Signed)
Pt notified rx ready for pick up on Friday.

## 2016-12-14 ENCOUNTER — Other Ambulatory Visit: Payer: Self-pay | Admitting: Internal Medicine

## 2016-12-14 DIAGNOSIS — C7931 Secondary malignant neoplasm of brain: Secondary | ICD-10-CM

## 2016-12-14 DIAGNOSIS — C3411 Malignant neoplasm of upper lobe, right bronchus or lung: Secondary | ICD-10-CM

## 2016-12-22 ENCOUNTER — Other Ambulatory Visit: Payer: Self-pay | Admitting: Internal Medicine

## 2016-12-24 ENCOUNTER — Ambulatory Visit (HOSPITAL_BASED_OUTPATIENT_CLINIC_OR_DEPARTMENT_OTHER): Payer: Medicare Other

## 2016-12-24 ENCOUNTER — Other Ambulatory Visit (HOSPITAL_BASED_OUTPATIENT_CLINIC_OR_DEPARTMENT_OTHER): Payer: Medicare Other

## 2016-12-24 ENCOUNTER — Telehealth: Payer: Self-pay | Admitting: Internal Medicine

## 2016-12-24 ENCOUNTER — Ambulatory Visit (HOSPITAL_BASED_OUTPATIENT_CLINIC_OR_DEPARTMENT_OTHER): Payer: Medicare Other | Admitting: Internal Medicine

## 2016-12-24 ENCOUNTER — Encounter: Payer: Self-pay | Admitting: Internal Medicine

## 2016-12-24 DIAGNOSIS — C7931 Secondary malignant neoplasm of brain: Secondary | ICD-10-CM

## 2016-12-24 DIAGNOSIS — R110 Nausea: Secondary | ICD-10-CM | POA: Diagnosis not present

## 2016-12-24 DIAGNOSIS — C3491 Malignant neoplasm of unspecified part of right bronchus or lung: Secondary | ICD-10-CM | POA: Diagnosis not present

## 2016-12-24 DIAGNOSIS — C3411 Malignant neoplasm of upper lobe, right bronchus or lung: Secondary | ICD-10-CM

## 2016-12-24 DIAGNOSIS — Z5111 Encounter for antineoplastic chemotherapy: Secondary | ICD-10-CM | POA: Diagnosis not present

## 2016-12-24 DIAGNOSIS — C3490 Malignant neoplasm of unspecified part of unspecified bronchus or lung: Secondary | ICD-10-CM

## 2016-12-24 LAB — CBC WITH DIFFERENTIAL/PLATELET
BASO%: 0.2 % (ref 0.0–2.0)
Basophils Absolute: 0 10*3/uL (ref 0.0–0.1)
EOS ABS: 0.1 10*3/uL (ref 0.0–0.5)
EOS%: 1.3 % (ref 0.0–7.0)
HCT: 37 % (ref 34.8–46.6)
HEMOGLOBIN: 11.5 g/dL — AB (ref 11.6–15.9)
LYMPH%: 18.6 % (ref 14.0–49.7)
MCH: 31 pg (ref 25.1–34.0)
MCHC: 31.1 g/dL — ABNORMAL LOW (ref 31.5–36.0)
MCV: 99.7 fL (ref 79.5–101.0)
MONO#: 1.1 10*3/uL — AB (ref 0.1–0.9)
MONO%: 13.3 % (ref 0.0–14.0)
NEUT%: 66.6 % (ref 38.4–76.8)
NEUTROS ABS: 5.5 10*3/uL (ref 1.5–6.5)
PLATELETS: 351 10*3/uL (ref 145–400)
RBC: 3.71 10*6/uL (ref 3.70–5.45)
RDW: 15 % — AB (ref 11.2–14.5)
WBC: 8.2 10*3/uL (ref 3.9–10.3)
lymph#: 1.5 10*3/uL (ref 0.9–3.3)

## 2016-12-24 LAB — COMPREHENSIVE METABOLIC PANEL
ALBUMIN: 3.3 g/dL — AB (ref 3.5–5.0)
ALK PHOS: 80 U/L (ref 40–150)
ALT: 11 U/L (ref 0–55)
ANION GAP: 10 meq/L (ref 3–11)
AST: 20 U/L (ref 5–34)
BILIRUBIN TOTAL: 0.34 mg/dL (ref 0.20–1.20)
BUN: 15.8 mg/dL (ref 7.0–26.0)
CO2: 28 mEq/L (ref 22–29)
Calcium: 9.6 mg/dL (ref 8.4–10.4)
Chloride: 104 mEq/L (ref 98–109)
Creatinine: 1.1 mg/dL (ref 0.6–1.1)
EGFR: 52 mL/min/{1.73_m2} — AB (ref >60)
Glucose: 152 mg/dl — ABNORMAL HIGH (ref 70–140)
Potassium: 4.4 mEq/L (ref 3.5–5.1)
Sodium: 142 mEq/L (ref 136–145)
TOTAL PROTEIN: 7.1 g/dL (ref 6.4–8.3)

## 2016-12-24 MED ORDER — ONDANSETRON HCL 8 MG PO TABS
ORAL_TABLET | ORAL | Status: AC
  Filled 2016-12-24: qty 1

## 2016-12-24 MED ORDER — HEPARIN SOD (PORK) LOCK FLUSH 100 UNIT/ML IV SOLN
500.0000 [IU] | Freq: Once | INTRAVENOUS | Status: AC | PRN
  Administered 2016-12-24: 500 [IU]
  Filled 2016-12-24: qty 5

## 2016-12-24 MED ORDER — DEXAMETHASONE SODIUM PHOSPHATE 10 MG/ML IJ SOLN
10.0000 mg | Freq: Once | INTRAMUSCULAR | Status: AC
  Administered 2016-12-24: 10 mg via INTRAVENOUS

## 2016-12-24 MED ORDER — CYANOCOBALAMIN 1000 MCG/ML IJ SOLN
INTRAMUSCULAR | Status: AC
  Filled 2016-12-24: qty 1

## 2016-12-24 MED ORDER — CYANOCOBALAMIN 1000 MCG/ML IJ SOLN
1000.0000 ug | Freq: Once | INTRAMUSCULAR | Status: AC
  Administered 2016-12-24: 1000 ug via INTRAMUSCULAR

## 2016-12-24 MED ORDER — ONDANSETRON HCL 8 MG PO TABS
8.0000 mg | ORAL_TABLET | Freq: Once | ORAL | Status: AC
  Administered 2016-12-24: 8 mg via ORAL

## 2016-12-24 MED ORDER — SODIUM CHLORIDE 0.9 % IV SOLN
Freq: Once | INTRAVENOUS | Status: AC
  Administered 2016-12-24: 10:00:00 via INTRAVENOUS

## 2016-12-24 MED ORDER — DEXAMETHASONE SODIUM PHOSPHATE 10 MG/ML IJ SOLN
INTRAMUSCULAR | Status: AC
  Filled 2016-12-24: qty 1

## 2016-12-24 MED ORDER — SODIUM CHLORIDE 0.9 % IV SOLN
500.0000 mg/m2 | Freq: Once | INTRAVENOUS | Status: AC
  Administered 2016-12-24: 800 mg via INTRAVENOUS
  Filled 2016-12-24: qty 20

## 2016-12-24 MED ORDER — ONDANSETRON HCL 8 MG PO TABS: ORAL_TABLET | ORAL | 0 refills | Status: DC

## 2016-12-24 MED ORDER — SODIUM CHLORIDE 0.9 % IJ SOLN
10.0000 mL | INTRAMUSCULAR | Status: DC | PRN
  Administered 2016-12-24: 10 mL
  Filled 2016-12-24: qty 10

## 2016-12-24 NOTE — Progress Notes (Signed)
[place] Telephone:(336) [phone]   Fax:(336) [phone]  OFFICE PROGRESS NOTE  [name], MD [place] [address]  DIAGNOSIS: Metastatic non-small cell lung cancer initially diagnosed as locally advanced stage IIIB with a right Pancoast tumor involving the vertebral body as well as prominent canal invasion with spinal cord compression in October 2007. The patient also has metastatic disease to the brain in April 2008.  PRIOR THERAPY: 1. Status post concurrent chemoradiation with weekly carboplatin and paclitaxel, last dose was given November 18, 2005. 2. 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 3. Status post gamma knife radiotherapy to a solitary brain lesion located in the superior frontal area of the brain at [facility] in April of 2008. 4. Status post palliative radiotherapy to the lateral abdominal wall metastatic lesion under the care of Dr. Lisbeth Renshaw, completed March of 2009. 5. Status post 6 cycles of systemic chemotherapy with carboplatin and Alimta. Last dose was given July 26, 2007 with disease stabilization. 6. Gamma knife stereotactic radiotherapy to 2 brain lesions one involving the right frontal dural based as well as right parietal lesion performed on 05/07/2012 under the care of Dr. Vallarie Mare at [facility].  CURRENT THERAPY: Maintenance systemic chemotherapy with Alimta 500 MG/M2 every 3 weeks, status post 161 cycles.  INTERVAL HISTORY: Danielle [name] 66 y.o. female returns to the clinic today for follow-up visit accompanied by her daughter.  The patient is feeling fine today with no specific complaints.  She gained 2 pounds since her last visit.  She denied having any chest pain, shortness breath, cough or hemoptysis.  She denied having any nausea, vomiting, diarrhea or constipation.  The patient has no fever or chills.  She continues to  tolerate her maintenance treatment with Alimta fairly well.  She is here for evaluation before starting cycle #162.   MEDICAL HISTORY: Past Medical History:  Diagnosis Date  . Anemia   . 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 CVA (cerebral vascular accident) ([place]) 04/2014   pt states had 2 cva's within 2 wks  . DJD (degenerative joint disease), cervical   . Fibromyalgia   . H/O: pneumonia   . History of tobacco abuse quit 10/08   on nicotine patch  . Hypertension   . Hypokalemia   . Lung cancer ([place]) dx'd 09/2005   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 had chemo and radiation for lung ca  . Thrush 12/11/2010    ALLERGIES:  is allergic to contrast media [iodinated diagnostic agents]; iohexol; sulfa drugs cross reactors; and co-trimoxazole injection [sulfamethoxazole-trimethoprim].  MEDICATIONS:  Current Outpatient Medications  Medication Sig Dispense Refill  . ALPRAZolam (XANAX) 0.25 MG tablet Take 1 tablet (0.25 mg total) by mouth at bedtime as needed. for sleep 30 tablet 0  . amLODipine (NORVASC) 10 MG tablet TAKE 1 TABLET (10 MG TOTAL) BY MOUTH DAILY. 90 tablet 1  . aspirin 325 MG EC tablet TAKE 1 TABLET BY MOUTH EVERY DAY 90 tablet 0  . atorvastatin (LIPITOR) 40 MG tablet TAKE 1 TABLET BY MOUTH EVERY DAY AT 6 PM 90 tablet 3  . bupivacaine-EPINEPHrine (MARCAINE W/ EPI) 0.25% -1:200000 SOLN Inject 15 mLs into the skin.    Marland Kitchen FeFum-FePoly-FA-B Cmp-C-Biot (INTEGRA PLUS) CAPS Take 1 capsule by mouth every morning. 90 capsule 1  . fluticasone (FLONASE) 50 MCG/ACT nasal spray Place 1-2 sprays into both nostrils daily as needed.  16 g 5  . folic acid (FOLVITE) 1 MG tablet Take 1 tablet (1 mg total) by mouth daily. 90 tablet 1  . furosemide (LASIX) 20 MG tablet TAKE 1 TABLET (20 MG TOTAL) BY MOUTH DAILY. AS NEEDED FOR SWELLING. 20 tablet 0  . lacosamide (VIMPAT) 50 MG TABS tablet Take by mouth.    . lidocaine (XYLOCAINE) 2 % injection Inject 15 mLs into the skin.    Marland Kitchen  lidocaine-prilocaine (EMLA) cream Apply to Port-A-Cath as directed 1-2 hours prior to chemotherapy 30 g 1  . lisinopril (PRINIVIL,ZESTRIL) 20 MG tablet TAKE 2 TABLETS (40 MG TOTAL) BY MOUTH DAILY. 180 tablet 1  . morphine (MS CONTIN) 60 MG 12 hr tablet Take 1 tablet (60 mg total) by mouth 2 (two) times daily. 60 tablet 0  . morphine (MSIR) 30 MG tablet Take 1 tablet (30 mg total) by mouth every 4 (four) hours as needed (breakthrough pain). 60 tablet 0  . Olopatadine HCl 0.2 % SOLN Apply 1 drop to eye daily. 2.5 mL 11  . ondansetron (ZOFRAN) 8 MG tablet TAKE 1 TABLET (8 MG TOTAL) BY MOUTH 2 (TWO) TIMES DAILY AS NEEDED FOR NAUSEA OR VOMITING. 30 tablet 0  . predniSONE (DELTASONE) 50 MG tablet 1 tablet by mouth 13 hours ,7 hours and 1 hour  before CT scan 3 tablet 1  . prochlorperazine (COMPAZINE) 10 MG tablet Take 10 mg by mouth.    . triamcinolone (KENALOG) 0.025 % ointment Apply 1 application topically 2 (two) times daily. 30 g 0  . diphenhydrAMINE (BENADRYL) 25 MG tablet Take 1 tablet (25 mg total) by mouth once. 2 hours before CT scan on 09/18/2015 10 tablet 0   No current facility-administered medications for this visit.     SURGICAL HISTORY:  Past Surgical History:  Procedure Laterality Date  . CERVICAL LAMINECTOMY  1995  . KNEE SURGERY  1990   Left x 2  . 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 LOOP RECORDER IMPLANT N/A 04/19/2014   Procedure: LOOP RECORDER IMPLANT;  Surgeon: Thompson Grayer, MD;  Location: [facility] CATH LAB;  Service: Cardiovascular;  Laterality: N/A;  . OTHER SURGICAL HISTORY  2008   Gamma knife surgery to remove brain met   . PORTACATH PLACEMENT  -[name]   TIP IN CAVOATRIAL JUNCTION  . TEE WITHOUT CARDIOVERSION N/A 04/19/2014   Procedure: TRANSESOPHAGEAL ECHOCARDIOGRAM (TEE);  Surgeon: Larey Dresser, MD;  Location: [facility] ENDOSCOPY;  Service: Cardiovascular;  Laterality: N/A;    REVIEW OF SYSTEMS:  A comprehensive review of systems was  negative.   PHYSICAL EXAMINATION: General appearance: alert, cooperative and no distress Head: Normocephalic, without obvious abnormality, atraumatic Neck: no adenopathy, no JVD, supple, symmetrical, trachea midline and thyroid not enlarged, symmetric, no tenderness/mass/nodules Lymph nodes: Cervical, supraclavicular, and axillary nodes normal. Resp: clear to auscultation bilaterally Back: symmetric, no curvature. ROM normal. No CVA tenderness. Cardio: regular rate and rhythm, S1, S2 normal, no murmur, click, rub or gallop GI: soft, non-tender; bowel sounds normal; no masses,  no organomegaly Extremities: extremities normal, atraumatic, no cyanosis or edema  ECOG PERFORMANCE STATUS: 0 - Asymptomatic  Blood pressure (!) 120/52, pulse 90, temperature 98.4 F (36.9 C), resp. rate 20, height '5\' 3"'  (1.6 m), weight 125 lb 3.2 oz (56.8 kg), SpO2 95 %.  LABORATORY DATA: Lab Results  Component Value Date   WBC 8.2 12/24/2016   HGB 11.5 (L) 12/24/2016   HCT 37.0 12/24/2016  MCV 99.7 12/24/2016   PLT 351 12/24/2016      Chemistry      Component Value Date/Time   NA 142 12/24/2016 0816   K 4.4 12/24/2016 0816   CL 103 01/26/2015 0818   CL 106 06/30/2012 1004   CO2 28 12/24/2016 0816   BUN 15.8 12/24/2016 0816   CREATININE 1.1 12/24/2016 0816      Component Value Date/Time   CALCIUM 9.6 12/24/2016 0816   ALKPHOS 80 12/24/2016 0816   AST 20 12/24/2016 0816   ALT 11 12/24/2016 0816   BILITOT 0.34 12/24/2016 0816       RADIOGRAPHIC STUDIES: No results found.  ASSESSMENT AND PLAN: This is a very pleasant 66 years old white female with metastatic non-small cell lung cancer diagnosed in October 2007 status post concurrent chemoradiation followed by consolidation chemotherapy and she is currently on maintenance treatment with single agent Alimta status post 161 cycles. The patient continues to tolerate this treatment well with no significant adverse effects. I recommended for her  to proceed with cycle 162 today as a schedule. I will see her back for follow-up visit in 3 weeks for evaluation after repeating CT scan of the chest, abdomen pelvis for restaging of her disease. For the nausea, I gave the patient refill of Zofran today. She was advised to call immediately if she has any concerning symptoms in the interval. The patient voices understanding of current disease status and treatment options and is in agreement with the current care plan. All questions were answered. The patient knows to call the clinic with any problems, questions or concerns. We can certainly see the patient much sooner if necessary.  Disclaimer: This note was dictated with voice recognition software. Similar sounding words can inadvertently be transcribed and may not be corrected upon review.

## 2016-12-24 NOTE — Telephone Encounter (Signed)
Gave patient avs and calendar with appts per 12/18 los - also gave contrast for ct of ab.

## 2016-12-24 NOTE — Patient Instructions (Signed)
[place] Discharge Instructions for Patients Receiving Chemotherapy  Today you received the following chemotherapy agents Alimta  To help prevent nausea and vomiting after your treatment, we encourage you to take your nausea medication as directed   If you develop nausea and vomiting that is not controlled by your nausea medication, call the clinic.   BELOW ARE SYMPTOMS THAT SHOULD BE REPORTED IMMEDIATELY:  *FEVER GREATER THAN 100.5 F  *CHILLS WITH OR WITHOUT FEVER  NAUSEA AND VOMITING THAT IS NOT CONTROLLED WITH YOUR NAUSEA MEDICATION  *UNUSUAL SHORTNESS OF BREATH  *UNUSUAL BRUISING OR BLEEDING  TENDERNESS IN MOUTH AND THROAT WITH OR WITHOUT PRESENCE OF ULCERS  *URINARY PROBLEMS  *BOWEL PROBLEMS  UNUSUAL RASH Items with * indicate a potential emergency and should be followed up as soon as possible.  Feel free to call the clinic should you have any questions or concerns. The clinic phone number is [phone].  Please show the [place] at check-in to the Emergency Department and triage nurse.

## 2017-01-01 ENCOUNTER — Other Ambulatory Visit: Payer: Self-pay | Admitting: Medical Oncology

## 2017-01-01 DIAGNOSIS — C3411 Malignant neoplasm of upper lobe, right bronchus or lung: Secondary | ICD-10-CM

## 2017-01-01 DIAGNOSIS — F411 Generalized anxiety disorder: Secondary | ICD-10-CM

## 2017-01-01 DIAGNOSIS — C3490 Malignant neoplasm of unspecified part of unspecified bronchus or lung: Secondary | ICD-10-CM

## 2017-01-01 DIAGNOSIS — Z5111 Encounter for antineoplastic chemotherapy: Secondary | ICD-10-CM

## 2017-01-01 DIAGNOSIS — Z91041 Radiographic dye allergy status: Secondary | ICD-10-CM

## 2017-01-01 DIAGNOSIS — C7931 Secondary malignant neoplasm of brain: Secondary | ICD-10-CM

## 2017-01-01 MED ORDER — ALPRAZOLAM 0.25 MG PO TABS: 0.2500 mg | ORAL_TABLET | Freq: Every evening | ORAL | 0 refills | Status: DC | PRN

## 2017-01-01 MED ORDER — PREDNISONE 50 MG PO TABS: ORAL_TABLET | ORAL | 3 refills | Status: DC

## 2017-01-03 ENCOUNTER — Other Ambulatory Visit: Payer: Self-pay | Admitting: *Deleted

## 2017-01-03 DIAGNOSIS — F411 Generalized anxiety disorder: Secondary | ICD-10-CM

## 2017-01-03 DIAGNOSIS — C3411 Malignant neoplasm of upper lobe, right bronchus or lung: Secondary | ICD-10-CM

## 2017-01-03 DIAGNOSIS — C3490 Malignant neoplasm of unspecified part of unspecified bronchus or lung: Secondary | ICD-10-CM

## 2017-01-03 DIAGNOSIS — Z5111 Encounter for antineoplastic chemotherapy: Secondary | ICD-10-CM

## 2017-01-03 DIAGNOSIS — Z91041 Radiographic dye allergy status: Secondary | ICD-10-CM

## 2017-01-03 DIAGNOSIS — C7931 Secondary malignant neoplasm of brain: Secondary | ICD-10-CM

## 2017-01-03 MED ORDER — ALPRAZOLAM 0.25 MG PO TABS: 0.2500 mg | ORAL_TABLET | Freq: Every evening | ORAL | 0 refills | Status: DC | PRN

## 2017-01-03 MED ORDER — PREDNISONE 50 MG PO TABS: ORAL_TABLET | ORAL | 3 refills | Status: DC

## 2017-01-03 NOTE — Telephone Encounter (Signed)
rx for prednisone and xanax called into pt pharmacy

## 2017-01-10 ENCOUNTER — Ambulatory Visit (HOSPITAL_COMMUNITY)
Admission: RE | Admit: 2017-01-10 | Discharge: 2017-01-10 | Disposition: A | Discharge status: Home / Self Care | Payer: Medicare Other | Source: Ambulatory Visit | Attending: Internal Medicine | Admitting: Internal Medicine

## 2017-01-10 DIAGNOSIS — I2510 Atherosclerotic heart disease of native coronary artery without angina pectoris: Secondary | ICD-10-CM | POA: Insufficient documentation

## 2017-01-10 DIAGNOSIS — C3490 Malignant neoplasm of unspecified part of unspecified bronchus or lung: Secondary | ICD-10-CM | POA: Diagnosis not present

## 2017-01-10 DIAGNOSIS — C7931 Secondary malignant neoplasm of brain: Secondary | ICD-10-CM | POA: Diagnosis not present

## 2017-01-10 MED ORDER — HEPARIN SOD (PORK) LOCK FLUSH 100 UNIT/ML IV SOLN
INTRAVENOUS | Status: AC
  Administered 2017-01-10: 500 [IU]
  Filled 2017-01-10: qty 5

## 2017-01-10 MED ORDER — IOPAMIDOL (ISOVUE-300) INJECTION 61%
INTRAVENOUS | Status: AC
  Filled 2017-01-10: qty 75

## 2017-01-10 MED ORDER — IOPAMIDOL (ISOVUE-300) INJECTION 61%
75.0000 mL | Freq: Once | INTRAVENOUS | Status: AC | PRN
  Administered 2017-01-10: 75 mL via INTRAVENOUS

## 2017-01-11 ENCOUNTER — Other Ambulatory Visit: Payer: Self-pay | Admitting: Internal Medicine

## 2017-01-13 ENCOUNTER — Other Ambulatory Visit: Payer: Self-pay | Admitting: Internal Medicine

## 2017-01-14 ENCOUNTER — Inpatient Hospital Stay: Payer: Medicare Other

## 2017-01-14 ENCOUNTER — Inpatient Hospital Stay: Payer: Medicare Other | Attending: Internal Medicine | Admitting: Internal Medicine

## 2017-01-14 ENCOUNTER — Encounter: Payer: Self-pay | Admitting: Internal Medicine

## 2017-01-14 DIAGNOSIS — F419 Anxiety disorder, unspecified: Secondary | ICD-10-CM | POA: Diagnosis not present

## 2017-01-14 DIAGNOSIS — C3491 Malignant neoplasm of unspecified part of right bronchus or lung: Secondary | ICD-10-CM

## 2017-01-14 DIAGNOSIS — C7931 Secondary malignant neoplasm of brain: Secondary | ICD-10-CM | POA: Insufficient documentation

## 2017-01-14 DIAGNOSIS — Z5111 Encounter for antineoplastic chemotherapy: Secondary | ICD-10-CM | POA: Diagnosis not present

## 2017-01-14 DIAGNOSIS — C3410 Malignant neoplasm of upper lobe, unspecified bronchus or lung: Secondary | ICD-10-CM

## 2017-01-14 DIAGNOSIS — C3411 Malignant neoplasm of upper lobe, right bronchus or lung: Secondary | ICD-10-CM

## 2017-01-14 DIAGNOSIS — C3490 Malignant neoplasm of unspecified part of unspecified bronchus or lung: Secondary | ICD-10-CM

## 2017-01-14 LAB — COMPREHENSIVE METABOLIC PANEL
ALBUMIN: 3.2 g/dL — AB (ref 3.5–5.0)
ALK PHOS: 79 U/L (ref 40–150)
ALT: 14 U/L (ref 0–55)
AST: 20 U/L (ref 5–34)
Anion gap: 9 (ref 3–11)
BUN: 13 mg/dL (ref 7–26)
CALCIUM: 9.6 mg/dL (ref 8.4–10.4)
CO2: 26 mmol/L (ref 22–29)
CREATININE: 0.99 mg/dL (ref 0.60–1.10)
Chloride: 105 mmol/L (ref 98–109)
GFR calc non Af Amer: 59 mL/min — ABNORMAL LOW (ref >60)
GLUCOSE: 152 mg/dL — AB (ref 70–140)
Potassium: 4 mmol/L (ref 3.3–4.7)
SODIUM: 140 mmol/L (ref 136–145)
Total Bilirubin: 0.3 mg/dL (ref 0.2–1.2)
Total Protein: 6.9 g/dL (ref 6.4–8.3)

## 2017-01-14 LAB — CBC WITH DIFFERENTIAL/PLATELET
Abs Granulocyte: 6 10*3/uL (ref 1.5–6.5)
Basophils Absolute: 0 10*3/uL (ref 0.0–0.1)
Basophils Relative: 0 %
EOS ABS: 0.1 10*3/uL (ref 0.0–0.5)
EOS PCT: 1 %
HCT: 36.5 % (ref 34.8–46.6)
Hemoglobin: 11.4 g/dL — ABNORMAL LOW (ref 11.6–15.9)
LYMPHS ABS: 1.8 10*3/uL (ref 0.9–3.3)
Lymphocytes Relative: 21 %
MCH: 31.2 pg (ref 25.1–34.0)
MCHC: 31.2 g/dL — AB (ref 31.5–36.0)
MCV: 100 fL (ref 79.5–101.0)
MONOS PCT: 10 %
Monocytes Absolute: 0.9 10*3/uL (ref 0.1–0.9)
Neutro Abs: 6 10*3/uL (ref 1.5–6.5)
Neutrophils Relative %: 68 %
PLATELETS: 356 10*3/uL (ref 145–400)
RBC: 3.65 MIL/uL — ABNORMAL LOW (ref 3.70–5.45)
RDW: 14.5 % (ref 11.2–16.1)
WBC: 8.8 10*3/uL (ref 3.9–10.3)

## 2017-01-14 MED ORDER — MORPHINE SULFATE 30 MG PO TABS: 30.0000 mg | ORAL_TABLET | ORAL | 0 refills | Status: DC | PRN

## 2017-01-14 MED ORDER — ONDANSETRON HCL 8 MG PO TABS
ORAL_TABLET | ORAL | Status: AC
  Filled 2017-01-14: qty 1

## 2017-01-14 MED ORDER — MORPHINE SULFATE ER 60 MG PO TBCR
60.0000 mg | EXTENDED_RELEASE_TABLET | Freq: Two times a day (BID) | ORAL | 0 refills | Status: DC
Start: 2017-01-14 — End: 2017-02-10

## 2017-01-14 MED ORDER — DEXAMETHASONE SODIUM PHOSPHATE 10 MG/ML IJ SOLN
10.0000 mg | Freq: Once | INTRAMUSCULAR | Status: AC
  Administered 2017-01-14: 10 mg via INTRAVENOUS

## 2017-01-14 MED ORDER — ONDANSETRON HCL 8 MG PO TABS
8.0000 mg | ORAL_TABLET | Freq: Once | ORAL | Status: AC
  Administered 2017-01-14: 8 mg via ORAL

## 2017-01-14 MED ORDER — SODIUM CHLORIDE 0.9 % IJ SOLN
10.0000 mL | INTRAMUSCULAR | Status: DC | PRN
Start: 2017-01-14 — End: 2017-01-14
  Administered 2017-01-14: 10 mL
  Filled 2017-01-14: qty 10

## 2017-01-14 MED ORDER — HEPARIN SOD (PORK) LOCK FLUSH 100 UNIT/ML IV SOLN
500.0000 [IU] | Freq: Once | INTRAVENOUS | Status: AC | PRN
  Administered 2017-01-14: 500 [IU]
  Filled 2017-01-14: qty 5

## 2017-01-14 MED ORDER — SODIUM CHLORIDE 0.9 % IV SOLN
500.0000 mg/m2 | Freq: Once | INTRAVENOUS | Status: AC
  Administered 2017-01-14: 800 mg via INTRAVENOUS
  Filled 2017-01-14: qty 20

## 2017-01-14 MED ORDER — LIDOCAINE-PRILOCAINE 2.5-2.5 % EX CREA: TOPICAL_CREAM | CUTANEOUS | 1 refills | Status: DC

## 2017-01-14 MED ORDER — SODIUM CHLORIDE 0.9 % IV SOLN
Freq: Once | INTRAVENOUS | Status: AC
  Administered 2017-01-14: 09:00:00 via INTRAVENOUS

## 2017-01-14 MED ORDER — DEXAMETHASONE SODIUM PHOSPHATE 10 MG/ML IJ SOLN
INTRAMUSCULAR | Status: AC
  Filled 2017-01-14: qty 1

## 2017-01-14 NOTE — Progress Notes (Signed)
[facility] [phone]   [phone]  OFFICE PROGRESS NOTE  [name], MD [place] [address]  DIAGNOSIS: Metastatic non-small cell lung cancer initially diagnosed as locally advanced stage IIIB with a right Pancoast tumor involving the vertebral body as well as prominent canal invasion with spinal cord compression in October 2007. The patient also has metastatic disease to the brain in April 2008.  PRIOR THERAPY: 1. Status post concurrent chemoradiation with weekly carboplatin and paclitaxel, last dose was given November 18, 2005. 2. 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 3. Status post gamma knife radiotherapy to a solitary brain lesion located in the superior frontal area of the brain at [facility] in April of 2008. 4. Status post palliative radiotherapy to the lateral abdominal wall metastatic lesion under the care of Dr. Lisbeth Renshaw, completed March of 2009. 5. Status post 6 cycles of systemic chemotherapy with carboplatin and Alimta. Last dose was given July 26, 2007 with disease stabilization. 6. Gamma knife stereotactic radiotherapy to 2 brain lesions one involving the right frontal dural based as well as right parietal lesion performed on 05/07/2012 under the care of Dr. Vallarie Mare at [facility].  CURRENT THERAPY: Maintenance systemic chemotherapy with Alimta 500 MG/M2 every 3 weeks, status post 162 cycles.  INTERVAL HISTORY: Danielle [name] 67 y.o. female returns to the clinic today for follow-up visit accompanied by her daughter.  The patient is feeling fine today with no specific complaints except for the chronic back pain.  She also has chest congestion and cough productive of clear sputum.  She denied having any fever or chills.  She has no nausea, vomiting, diarrhea or constipation.  She has no chest pain, shortness of breath  or hemoptysis.  She denied having any recent weight loss or night sweats.  She had repeat CT scan of the chest, abdomen and pelvis performed recently and she is here for evaluation and discussion of her scan results.   MEDICAL HISTORY: Past Medical History:  Diagnosis Date  . Anemia   . 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 CVA (cerebral vascular accident) ([facility]) 04/2014   pt states had 2 cva's within 2 wks  . DJD (degenerative joint disease), cervical   . Fibromyalgia   . H/O: pneumonia   . History of tobacco abuse quit 10/08   on nicotine patch  . Hypertension   . Hypokalemia   . Lung cancer ([place]) dx'd 09/2005   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 had chemo and radiation for lung ca  . Thrush 12/11/2010    ALLERGIES:  is allergic to contrast media [iodinated diagnostic agents]; iohexol; sulfa drugs cross reactors; and co-trimoxazole injection [sulfamethoxazole-trimethoprim].  MEDICATIONS:  Current Outpatient Medications  Medication Sig Dispense Refill  . ALPRAZolam (XANAX) 0.25 MG tablet Take 1 tablet (0.25 mg total) by mouth at bedtime as needed. for sleep 30 tablet 0  . amLODipine (NORVASC) 10 MG tablet TAKE 1 TABLET (10 MG TOTAL) BY MOUTH DAILY. 90 tablet 1  . aspirin 325 MG EC tablet TAKE 1 TABLET BY MOUTH EVERY DAY 90 tablet 0  . atorvastatin (LIPITOR) 40 MG tablet TAKE 1 TABLET BY MOUTH EVERY DAY AT 6 PM 90 tablet 3  . bupivacaine-EPINEPHrine (MARCAINE W/ EPI) 0.25% -1:200000 SOLN Inject 15 mLs into the skin.    Marland Kitchen diphenhydrAMINE (BENADRYL) 25 MG tablet Take 1 tablet (25 mg  total) by mouth once. 2 hours before CT scan on 09/18/2015 10 tablet 0  . FeFum-FePoly-FA-B Cmp-C-Biot (INTEGRA PLUS) CAPS Take 1 capsule by mouth every morning. 90 capsule 1  . fluticasone (FLONASE) 50 MCG/ACT nasal spray Place 1-2 sprays into both nostrils daily as needed. 16 g 5  . folic acid (FOLVITE) 1 MG tablet TAKE 1 TABLET BY MOUTH EVERY DAY 90 tablet 1  . furosemide (LASIX)  20 MG tablet TAKE 1 TABLET (20 MG TOTAL) BY MOUTH DAILY. AS NEEDED FOR SWELLING. 20 tablet 0  . lacosamide (VIMPAT) 50 MG TABS tablet Take by mouth.    . lidocaine-prilocaine (EMLA) cream Apply to Port-A-Cath as directed 1-2 hours prior to chemotherapy 30 g 1  . lisinopril (PRINIVIL,ZESTRIL) 20 MG tablet TAKE 2 TABLETS (40 MG TOTAL) BY MOUTH DAILY. 180 tablet 1  . morphine (MS CONTIN) 60 MG 12 hr tablet Take 1 tablet (60 mg total) by mouth 2 (two) times daily. 60 tablet 0  . morphine (MSIR) 30 MG tablet Take 1 tablet (30 mg total) by mouth every 4 (four) hours as needed (breakthrough pain). 60 tablet 0  . Olopatadine HCl 0.2 % SOLN Apply 1 drop to eye daily. 2.5 mL 11  . ondansetron (ZOFRAN) 8 MG tablet TAKE 1 TABLET (8 MG TOTAL) BY MOUTH 2 (TWO) TIMES DAILY AS NEEDED FOR NAUSEA OR VOMITING. 30 tablet 0  . predniSONE (DELTASONE) 50 MG tablet 1 tablet by mouth 13 hours ,7 hours and 1 hour  before CT scan 3 tablet 3  . prochlorperazine (COMPAZINE) 10 MG tablet Take 10 mg by mouth.    . triamcinolone (KENALOG) 0.025 % ointment Apply 1 application topically 2 (two) times daily. 30 g 0   No current facility-administered medications for this visit.     SURGICAL HISTORY:  Past Surgical History:  Procedure Laterality Date  . CERVICAL LAMINECTOMY  1995  . KNEE SURGERY  1990   Left x 2  . 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 LOOP RECORDER IMPLANT N/A 04/19/2014   Procedure: LOOP RECORDER IMPLANT;  Surgeon: Thompson Grayer, MD;  Location: [facility] CATH LAB;  Service: Cardiovascular;  Laterality: N/A;  . OTHER SURGICAL HISTORY  2008   Gamma knife surgery to remove brain met   . PORTACATH PLACEMENT  -[name]   TIP IN CAVOATRIAL JUNCTION  . TEE WITHOUT CARDIOVERSION N/A 04/19/2014   Procedure: TRANSESOPHAGEAL ECHOCARDIOGRAM (TEE);  Surgeon: Larey Dresser, MD;  Location: [name];  Service: Cardiovascular;  Laterality: N/A;    REVIEW OF SYSTEMS:  Constitutional:  negative Eyes: negative Ears, nose, mouth, throat, and face: positive for nasal congestion Respiratory: negative Cardiovascular: negative Gastrointestinal: negative Genitourinary:negative Integument/breast: negative Hematologic/lymphatic: negative Musculoskeletal:negative Neurological: negative Behavioral/Psych: negative Endocrine: negative Allergic/Immunologic: negative   PHYSICAL EXAMINATION: General appearance: alert, cooperative and no distress Head: Normocephalic, without obvious abnormality, atraumatic Neck: no adenopathy, no JVD, supple, symmetrical, trachea midline and thyroid not enlarged, symmetric, no tenderness/mass/nodules Lymph nodes: Cervical, supraclavicular, and axillary nodes normal. Resp: clear to auscultation bilaterally Back: symmetric, no curvature. ROM normal. No CVA tenderness. Cardio: regular rate and rhythm, S1, S2 normal, no murmur, click, rub or gallop GI: soft, non-tender; bowel sounds normal; no masses,  no organomegaly Extremities: extremities normal, atraumatic, no cyanosis or edema Neurologic: Alert and oriented X 3, normal strength and tone. Normal symmetric reflexes. Normal coordination and gait  ECOG PERFORMANCE STATUS: 0 - Asymptomatic  Blood pressure (!) 129/56, pulse 85, temperature (!)  97.2 F (36.2 C), temperature source Oral, resp. rate 18, height 5' 3" (1.6 m), weight 123 lb 4.8 oz (55.9 kg), SpO2 97 %.  LABORATORY DATA: Lab Results  Component Value Date   WBC 8.2 12/24/2016   HGB 11.5 (L) 12/24/2016   HCT 37.0 12/24/2016   MCV 99.7 12/24/2016   PLT 351 12/24/2016      Chemistry      Component Value Date/Time   NA 142 12/24/2016 0816   K 4.4 12/24/2016 0816   CL 103 01/26/2015 0818   CL 106 06/30/2012 1004   CO2 28 12/24/2016 0816   BUN 15.8 12/24/2016 0816   CREATININE 1.1 12/24/2016 0816      Component Value Date/Time   CALCIUM 9.6 12/24/2016 0816   ALKPHOS 80 12/24/2016 0816   AST 20 12/24/2016 0816   ALT 11  12/24/2016 0816   BILITOT 0.34 12/24/2016 0816       RADIOGRAPHIC STUDIES: Ct Chest W Contrast  Result Date: 01/10/2017 CLINICAL DATA:  Non-small cell lung cancer restaging, prior metastatic disease to the spine, brain, and omentum. EXAM: CT CHEST, ABDOMEN, AND PELVIS WITH CONTRAST TECHNIQUE: Multidetector CT imaging of the chest, abdomen and pelvis was performed following the standard protocol during bolus administration of intravenous contrast. CONTRAST:  72m ISOVUE-300 IOPAMIDOL (ISOVUE-300) INJECTION 61% COMPARISON:  Multiple exams, including 09/06/2016 FINDINGS: CT CHEST FINDINGS Cardiovascular: Right Port-A-Cath tip: Cavoatrial junction. Coronary, aortic arch, and branch vessel atherosclerotic vascular disease. Loop recorder device noted. Mediastinum/Nodes: No pathologic thoracic adenopathy. Lungs/Pleura: Reduced size of the probably exudative right pleural effusion, currently small in size. Stable nodular pleural thickening at the right lung apex with adjacent interstitial accentuation and pleural irregularity. There is some mild left posterior apical pleural scarring as well. No new or progressive nodularity or progressive masslike appearance at the right lung apex. Frothy material in the bronchus intermedius and right lower lobe tracheobronchial tree compatible with mucus. Severe centrilobular emphysema. Mild bilateral airway thickening. Airway plugging in the left lower lobe has improved compared to the prior exam. Musculoskeletal: Stable sclerosis and slight irregularity of the right first rib adjacent to the pleural thickening. Stable appearance of vertebra plana at the T3 vertebral level and vertebral augmentations at T4 and T7. Stable appearance of chronic avascular necrosis along both humeral heads without flattening. CT ABDOMEN PELVIS FINDINGS Hepatobiliary: Unremarkable Pancreas: Unremarkable Spleen: Unremarkable Adrenals/Urinary Tract: Unremarkable Stomach/Bowel: Prominent stool throughout  the colon favors constipation. Vascular/Lymphatic: Aortoiliac atherosclerotic vascular disease. No pathologic adenopathy is observed. Reproductive: Unremarkable Other: Subtle lower presacral edema, chronically stable. 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 Mild lumbar spondylosis and degenerative disc disease. Stable subtle sclerosis posteriorly in the right L1 vertebral body. IMPRESSION: 1. Reduced size of the probably exudative right pleural effusion. There is some faint enhancement along the parietal pleural margin but without obvious tumor nodularity along the pleural effusion. 2. Stable pleural thickening, scarring, and irregularity at the right lung apex, much of this probably therapy related. Stable mild adjacent sclerosis in the right first rib. 3. No findings of new metastatic disease in the chest, or abdomen/pelvis. 4. Other imaging findings of potential clinical significance: Aortic Atherosclerosis (ICD10-I70.0) and Emphysema (ICD10-J43.9). Coronary atherosclerosis. Mucus and mild plugging in the right tracheobronchial tree. Stable vertebra plana at T 3. Stable vertebral augmentations at T4 and T7. Stable faint sclerosis posteriorly in the right L1 vertebral body. Prominent stool throughout the colon favors constipation. 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 Electronically Signed   By: Van Clines M.D.   On: 01/10/2017 10:55   Ct Abdomen Pelvis W Contrast  Result Date: 01/10/2017 CLINICAL DATA:  Non-small cell lung cancer restaging, prior metastatic disease to the spine, brain, and omentum. EXAM: CT CHEST, ABDOMEN, AND PELVIS WITH CONTRAST TECHNIQUE: Multidetector CT imaging of the chest, abdomen and pelvis was performed following the standard protocol during bolus administration of intravenous contrast. CONTRAST:  105m ISOVUE-300 IOPAMIDOL (ISOVUE-300) INJECTION 61% COMPARISON:  Multiple exams, including  09/06/2016 FINDINGS: CT CHEST FINDINGS Cardiovascular: Right Port-A-Cath tip: Cavoatrial junction. Coronary, aortic arch, and branch vessel atherosclerotic vascular disease. Loop recorder device noted. Mediastinum/Nodes: No pathologic thoracic adenopathy. Lungs/Pleura: Reduced size of the probably exudative right pleural effusion, currently small in size. Stable nodular pleural thickening at the right lung apex with adjacent interstitial accentuation and pleural irregularity. There is some mild left posterior apical pleural scarring as well. No new or progressive nodularity or progressive masslike appearance at the right lung apex. Frothy material in the bronchus intermedius and right lower lobe tracheobronchial tree compatible with mucus. Severe centrilobular emphysema. Mild bilateral airway thickening. Airway plugging in the left lower lobe has improved compared to the prior exam. Musculoskeletal: Stable sclerosis and slight irregularity of the right first rib adjacent to the pleural thickening. Stable appearance of vertebra plana at the T3 vertebral level and vertebral augmentations at T4 and T7. Stable appearance of chronic avascular necrosis along both humeral heads without flattening. CT ABDOMEN PELVIS FINDINGS Hepatobiliary: Unremarkable Pancreas: Unremarkable Spleen: Unremarkable Adrenals/Urinary Tract: Unremarkable Stomach/Bowel: Prominent stool throughout the colon favors constipation. Vascular/Lymphatic: Aortoiliac atherosclerotic vascular disease. No pathologic adenopathy is observed. Reproductive: Unremarkable Other: Subtle lower presacral edema, chronically stable. 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 Mild lumbar spondylosis and degenerative disc disease. Stable subtle sclerosis posteriorly in the right L1 vertebral body. IMPRESSION: 1. Reduced size of the probably exudative right pleural effusion. There is some faint enhancement along the  parietal pleural margin but without obvious tumor nodularity along the pleural effusion. 2. Stable pleural thickening, scarring, and irregularity at the right lung apex, much of this probably therapy related. Stable mild adjacent sclerosis in the right first rib. 3. No findings of new metastatic disease in the chest, or abdomen/pelvis. 4. Other imaging findings of potential clinical significance: Aortic Atherosclerosis (ICD10-I70.0) and Emphysema (ICD10-J43.9). Coronary atherosclerosis. Mucus and mild plugging in the right tracheobronchial tree. Stable vertebra plana at T 3. Stable vertebral augmentations at T4 and T7. Stable faint sclerosis posteriorly in the right L1 vertebral body. Prominent stool throughout the colon favors constipation. 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 Electronically Signed   By: WVan ClinesM.D.   On: 01/10/2017 10:55    ASSESSMENT AND PLAN: This is a very pleasant 67years old white female with metastatic non-small cell lung cancer diagnosed in October 2007 status post concurrent chemoradiation followed by consolidation chemotherapy and she is currently on maintenance treatment with single agent Alimta status post 162 cycles. The patient continues to tolerate her treatment fairly well with no concerning complaints. She had repeat CT scan of the chest, abdomen and pelvis that showed no evidence for disease progression. I discussed the scan results with the patient and her daughter.  I recommended for her to continue her current treatment with maintenance Alimta and she would proceed with cycle #163 today. For pain management, I gave her a refill of MS Contin and morphine sulfate. The patient also  had refill for Emla Cream. She will come back for follow-up visit in 3 weeks for evaluation before the next cycle of her treatment. She was advised to call immediately if she has any concerning symptoms in the interval. The patient voices understanding  of current disease status and treatment options and is in agreement with the current care plan. All questions were answered. The patient knows to call the clinic with any problems, questions or concerns. We can certainly see the patient much sooner if necessary.  Disclaimer: This note was dictated with voice recognition software. Similar sounding words can inadvertently be transcribed and may not be corrected upon review.

## 2017-01-14 NOTE — Patient Instructions (Signed)
[facility] Discharge Instructions for Patients Receiving Chemotherapy  Today you received the following chemotherapy agents Alimta.  To help prevent nausea and vomiting after your treatment, we encourage you to take your nausea medication as prescribed.    If you develop nausea and vomiting that is not controlled by your nausea medication, call the clinic.   BELOW ARE SYMPTOMS THAT SHOULD BE REPORTED IMMEDIATELY:  *FEVER GREATER THAN 100.5 F  *CHILLS WITH OR WITHOUT FEVER  NAUSEA AND VOMITING THAT IS NOT CONTROLLED WITH YOUR NAUSEA MEDICATION  *UNUSUAL SHORTNESS OF BREATH  *UNUSUAL BRUISING OR BLEEDING  TENDERNESS IN MOUTH AND THROAT WITH OR WITHOUT PRESENCE OF ULCERS  *URINARY PROBLEMS  *BOWEL PROBLEMS  UNUSUAL RASH Items with * indicate a potential emergency and should be followed up as soon as possible.  Feel free to call the clinic should you have any questions or concerns. The clinic phone number is [phone].  Please show the [facility] at check-in to the Emergency Department and triage nurse.

## 2017-01-17 ENCOUNTER — Encounter: Payer: Self-pay | Admitting: Internal Medicine

## 2017-01-17 NOTE — Progress Notes (Signed)
Received PA determination from [employer] for Lidocaine/Prilocaine cream.  PA approved 01/07/17-04/17/17.

## 2017-01-17 NOTE — Progress Notes (Signed)
Received PA request for Lidocaine-Prilocaine cream.  Submitted via Cover My Meds:  Danielle [name] (Key: [id number])   This request has received a Favorable outcome. Please note any additional information provided by [employer] Part D at the bottom of this request.

## 2017-01-18 ENCOUNTER — Other Ambulatory Visit: Payer: Self-pay | Admitting: Internal Medicine

## 2017-01-18 DIAGNOSIS — C7931 Secondary malignant neoplasm of brain: Secondary | ICD-10-CM

## 2017-01-21 ENCOUNTER — Telehealth: Payer: Self-pay | Admitting: Medical Oncology

## 2017-01-21 NOTE — Telephone Encounter (Signed)
I left message for pt to return my call about a letter from [employer].

## 2017-01-27 ENCOUNTER — Other Ambulatory Visit (INDEPENDENT_AMBULATORY_CARE_PROVIDER_SITE_OTHER): Payer: Medicare Other

## 2017-01-27 ENCOUNTER — Telehealth: Payer: Self-pay | Admitting: Family Medicine

## 2017-01-27 DIAGNOSIS — E119 Type 2 diabetes mellitus without complications: Secondary | ICD-10-CM | POA: Diagnosis not present

## 2017-01-27 LAB — LIPID PANEL
Cholesterol: 117 mg/dL (ref 0–200)
HDL: 34 mg/dL — ABNORMAL LOW (ref >39.00)
LDL CALC: 63 mg/dL (ref 0–99)
NonHDL: 82.98
Total CHOL/HDL Ratio: 3
Triglycerides: 102 mg/dL (ref 0.0–149.0)
VLDL: 20.4 mg/dL (ref 0.0–40.0)

## 2017-01-27 LAB — HEMOGLOBIN A1C: Hgb A1c MFr Bld: 5.8 % (ref 4.6–6.5)

## 2017-01-27 NOTE — Telephone Encounter (Signed)
-----   Message from Ellamae Sia sent at 01/20/2017 11:34 AM EST ----- Regarding: Lab orders for Monday, 1.21.19 Patient is scheduled for CPX labs, please order future labs, Thanks , Karna Christmas

## 2017-01-30 ENCOUNTER — Other Ambulatory Visit: Payer: Self-pay

## 2017-01-30 ENCOUNTER — Ambulatory Visit: Payer: Medicare Other | Admitting: Family Medicine

## 2017-01-30 ENCOUNTER — Encounter: Payer: Self-pay | Admitting: Family Medicine

## 2017-01-30 ENCOUNTER — Ambulatory Visit (INDEPENDENT_AMBULATORY_CARE_PROVIDER_SITE_OTHER): Payer: Medicare Other | Admitting: Family Medicine

## 2017-01-30 VITALS — BP 90/50 | HR 94 | Temp 98.4°F | Ht 63.0 in | Wt 120.5 lb

## 2017-01-30 DIAGNOSIS — E119 Type 2 diabetes mellitus without complications: Secondary | ICD-10-CM | POA: Diagnosis not present

## 2017-01-30 DIAGNOSIS — E7800 Pure hypercholesterolemia, unspecified: Secondary | ICD-10-CM | POA: Diagnosis not present

## 2017-01-30 DIAGNOSIS — I700 Atherosclerosis of aorta: Secondary | ICD-10-CM | POA: Diagnosis not present

## 2017-01-30 DIAGNOSIS — I10 Essential (primary) hypertension: Secondary | ICD-10-CM

## 2017-01-30 DIAGNOSIS — C3490 Malignant neoplasm of unspecified part of unspecified bronchus or lung: Secondary | ICD-10-CM | POA: Diagnosis not present

## 2017-01-30 DIAGNOSIS — C7931 Secondary malignant neoplasm of brain: Secondary | ICD-10-CM | POA: Diagnosis not present

## 2017-01-30 NOTE — Assessment & Plan Note (Signed)
Followed by ONC. ON lifelong chemo

## 2017-01-30 NOTE — Assessment & Plan Note (Addendum)
Risk factor control.

## 2017-01-30 NOTE — Assessment & Plan Note (Signed)
Good control on no med with diet.

## 2017-01-30 NOTE — Progress Notes (Signed)
Subjective:    Patient ID: Danielle [name], female    DOB: [birth year], 67 y.o.   MRN: [id number]  HPI    67 year old female presents for 6 month follow up   metastatic lung cancer:  In remission on [place] chemo. Treatment 164 this week.  Diabetes:   Good control on  No med. Lab Results  Component Value Date   HGBA1C 5.8 01/27/2017  Using medications without difficulties: Hypoglycemic episodes:? Hyperglycemic episodes:? Feet problems: no ulcers Blood Sugars averaging: not checking eye exam within last year: yes in last year.. Will get records.  Well healing scratch on nose from grandson.  Hypertension:    Lower than usual today on amlodipine, lasix and lisinopril BP Readings from Last 3 Encounters:  01/30/17 (!) 90/50  01/14/17 (!) 129/56  12/24/16 (!) 120/52  Using medication without problems or lightheadedness:  none Chest pain with exertion: none Edema:using lasix prn Short of breath: stable Average home BPs: Other issues:  Elevated Cholesterol:  Good control on lipitor 40 mg  Lab Results  Component Value Date   CHOL 117 01/27/2017   HDL 34.00 (L) 01/27/2017   LDLCALC 63 01/27/2017   LDLDIRECT 105.5 08/06/2013   TRIG 102.0 01/27/2017   CHOLHDL 3 01/27/2017  Using medications without problems: Muscle aches:  Diet compliance: good Exercise: limited with low back Other complaints:    She feels better having lost weight. Eating well. Wt Readings from Last 3 Encounters:  01/30/17 120 lb 8 oz (54.7 kg)  01/14/17 123 lb 4.8 oz (55.9 kg)  12/24/16 125 lb 3.2 oz (56.8 kg)    Review of Systems  Constitutional: Negative for fatigue and fever.  HENT: Negative for congestion.   Eyes: Negative for pain.  Respiratory: Negative for cough and shortness of breath.   Cardiovascular: Negative for chest pain, palpitations and leg swelling.  Gastrointestinal: Negative for abdominal pain.  Genitourinary: Negative for dysuria and vaginal bleeding.  Musculoskeletal:  Negative for back pain.  Neurological: Negative for syncope, light-headedness and headaches.  Psychiatric/Behavioral: Negative for dysphoric mood.       Objective:   Physical Exam  Constitutional: Vital signs are normal. She appears well-developed and well-nourished. She is cooperative.  Non-toxic appearance. She does not appear ill. No distress.  HENT:  Head: Normocephalic.  Right Ear: Hearing, tympanic membrane, external ear and ear canal normal.  Left Ear: Hearing, tympanic membrane, external ear and ear canal normal.  Nose: Nose normal.  Eyes: Conjunctivae, EOM and lids are normal. Pupils are equal, round, and reactive to light. Lids are everted and swept, no foreign bodies found.  Neck: Trachea normal and normal range of motion. Neck supple. Carotid bruit is not present. No thyroid mass and no thyromegaly present.  Cardiovascular: Normal rate, regular rhythm, S1 normal, S2 normal, normal heart sounds and intact distal pulses. Exam reveals no gallop.  No murmur heard. Pulmonary/Chest: Effort normal and breath sounds normal. No respiratory distress. She has no wheezes. She has no rhonchi. She has no rales.  Abdominal: Soft. Normal appearance and bowel sounds are normal. She exhibits no distension, no fluid wave, no abdominal bruit and no mass. There is no hepatosplenomegaly. There is no tenderness. There is no rebound, no guarding and no CVA tenderness. No hernia.  Lymphadenopathy:    She has no cervical adenopathy.    She has no axillary adenopathy.  Neurological: She is alert. She has normal strength. No cranial nerve deficit or sensory deficit.  Skin: Skin is warm,  dry and intact. No rash noted.  Psychiatric: Her speech is normal and behavior is normal. Judgment normal. Her mood appears not anxious. Cognition and memory are normal. She does not exhibit a depressed mood.    Diabetic foot exam: Normal inspection No skin breakdown No calluses  Normal DP pulses Normal sensation to  light touch and monofilament Nails normal     Assessment & Plan:

## 2017-01-30 NOTE — Assessment & Plan Note (Signed)
Well controlled. Continue current medication. Encouraged exercise, weight loss, healthy eating habits.  

## 2017-01-30 NOTE — Assessment & Plan Note (Signed)
Well controlled. Continue current medication.  

## 2017-02-04 ENCOUNTER — Inpatient Hospital Stay: Payer: Medicare Other

## 2017-02-04 ENCOUNTER — Inpatient Hospital Stay (HOSPITAL_BASED_OUTPATIENT_CLINIC_OR_DEPARTMENT_OTHER): Payer: Medicare Other | Admitting: Internal Medicine

## 2017-02-04 ENCOUNTER — Telehealth: Payer: Self-pay

## 2017-02-04 ENCOUNTER — Encounter: Payer: Self-pay | Admitting: Internal Medicine

## 2017-02-04 VITALS — BP 130/55 | HR 89 | Temp 97.5°F | Resp 17 | Ht 63.0 in | Wt 120.6 lb

## 2017-02-04 DIAGNOSIS — C3490 Malignant neoplasm of unspecified part of unspecified bronchus or lung: Secondary | ICD-10-CM

## 2017-02-04 DIAGNOSIS — Z5111 Encounter for antineoplastic chemotherapy: Secondary | ICD-10-CM

## 2017-02-04 DIAGNOSIS — F419 Anxiety disorder, unspecified: Secondary | ICD-10-CM

## 2017-02-04 DIAGNOSIS — C3411 Malignant neoplasm of upper lobe, right bronchus or lung: Secondary | ICD-10-CM

## 2017-02-04 DIAGNOSIS — C7931 Secondary malignant neoplasm of brain: Secondary | ICD-10-CM

## 2017-02-04 DIAGNOSIS — F411 Generalized anxiety disorder: Secondary | ICD-10-CM

## 2017-02-04 LAB — CBC WITH DIFFERENTIAL/PLATELET
BASOS ABS: 0 10*3/uL (ref 0.0–0.1)
Basophils Relative: 1 %
EOS ABS: 0.1 10*3/uL (ref 0.0–0.5)
EOS PCT: 1 %
HCT: 35.4 % (ref 34.8–46.6)
HEMOGLOBIN: 11.6 g/dL (ref 11.6–15.9)
LYMPHS PCT: 20 %
Lymphs Abs: 1.8 10*3/uL (ref 0.9–3.3)
MCH: 31.5 pg (ref 25.1–34.0)
MCHC: 32.7 g/dL (ref 31.5–36.0)
MCV: 96.3 fL (ref 79.5–101.0)
Monocytes Absolute: 1 10*3/uL — ABNORMAL HIGH (ref 0.1–0.9)
Monocytes Relative: 12 %
NEUTROS PCT: 66 %
Neutro Abs: 5.9 10*3/uL (ref 1.5–6.5)
PLATELETS: 388 10*3/uL (ref 145–400)
RBC: 3.67 MIL/uL — AB (ref 3.70–5.45)
RDW: 14.8 % (ref 11.2–16.1)
WBC: 8.8 10*3/uL (ref 3.9–10.3)

## 2017-02-04 LAB — COMPREHENSIVE METABOLIC PANEL
ALT: 11 U/L (ref 0–55)
AST: 20 U/L (ref 5–34)
Albumin: 3.2 g/dL — ABNORMAL LOW (ref 3.5–5.0)
Alkaline Phosphatase: 76 U/L (ref 40–150)
Anion gap: 7 (ref 3–11)
BILIRUBIN TOTAL: 0.4 mg/dL (ref 0.2–1.2)
BUN: 18 mg/dL (ref 7–26)
CHLORIDE: 103 mmol/L (ref 98–109)
CO2: 29 mmol/L (ref 22–29)
CREATININE: 1.07 mg/dL (ref 0.60–1.10)
Calcium: 9.4 mg/dL (ref 8.4–10.4)
GFR, EST NON AFRICAN AMERICAN: 54 mL/min — AB (ref >60)
Glucose, Bld: 113 mg/dL (ref 70–140)
POTASSIUM: 4 mmol/L (ref 3.3–4.7)
Sodium: 139 mmol/L (ref 136–145)
TOTAL PROTEIN: 6.9 g/dL (ref 6.4–8.3)

## 2017-02-04 MED ORDER — SODIUM CHLORIDE 0.9 % IJ SOLN
10.0000 mL | INTRAMUSCULAR | Status: DC | PRN
  Administered 2017-02-04: 10 mL
  Filled 2017-02-04: qty 10

## 2017-02-04 MED ORDER — SODIUM CHLORIDE 0.9 % IV SOLN
500.0000 mg/m2 | Freq: Once | INTRAVENOUS | Status: AC
  Administered 2017-02-04: 800 mg via INTRAVENOUS
  Filled 2017-02-04: qty 20

## 2017-02-04 MED ORDER — DEXAMETHASONE SODIUM PHOSPHATE 10 MG/ML IJ SOLN
10.0000 mg | Freq: Once | INTRAMUSCULAR | Status: AC
  Administered 2017-02-04: 10 mg via INTRAVENOUS

## 2017-02-04 MED ORDER — HEPARIN SOD (PORK) LOCK FLUSH 100 UNIT/ML IV SOLN
500.0000 [IU] | Freq: Once | INTRAVENOUS | Status: AC | PRN
  Administered 2017-02-04: 500 [IU]
  Filled 2017-02-04: qty 5

## 2017-02-04 MED ORDER — SODIUM CHLORIDE 0.9 % IV SOLN
Freq: Once | INTRAVENOUS | Status: AC
  Administered 2017-02-04: 10:00:00 via INTRAVENOUS

## 2017-02-04 MED ORDER — DEXAMETHASONE SODIUM PHOSPHATE 10 MG/ML IJ SOLN
INTRAMUSCULAR | Status: AC
  Filled 2017-02-04: qty 1

## 2017-02-04 MED ORDER — ONDANSETRON HCL 8 MG PO TABS
8.0000 mg | ORAL_TABLET | Freq: Once | ORAL | Status: AC
  Administered 2017-02-04: 8 mg via ORAL

## 2017-02-04 MED ORDER — ALPRAZOLAM 0.25 MG PO TABS: 0.2500 mg | ORAL_TABLET | Freq: Every evening | ORAL | 0 refills | Status: DC | PRN

## 2017-02-04 MED ORDER — ONDANSETRON HCL 8 MG PO TABS
ORAL_TABLET | ORAL | Status: AC
  Filled 2017-02-04: qty 1

## 2017-02-04 NOTE — Telephone Encounter (Signed)
Printed avs and calender for upcoming appointments. Per 1/29 los

## 2017-02-04 NOTE — Patient Instructions (Signed)
[facility] Discharge Instructions for Patients Receiving Chemotherapy  Today you received the following chemotherapy agents Alimta  To help prevent nausea and vomiting after your treatment, we encourage you to take your nausea medication as directed  If you develop nausea and vomiting that is not controlled by your nausea medication, call the clinic.   BELOW ARE SYMPTOMS THAT SHOULD BE REPORTED IMMEDIATELY:  *FEVER GREATER THAN 100.5 F  *CHILLS WITH OR WITHOUT FEVER  NAUSEA AND VOMITING THAT IS NOT CONTROLLED WITH YOUR NAUSEA MEDICATION  *UNUSUAL SHORTNESS OF BREATH  *UNUSUAL BRUISING OR BLEEDING  TENDERNESS IN MOUTH AND THROAT WITH OR WITHOUT PRESENCE OF ULCERS  *URINARY PROBLEMS  *BOWEL PROBLEMS  UNUSUAL RASH Items with * indicate a potential emergency and should be followed up as soon as possible.  Feel free to call the clinic should you have any questions or concerns. The clinic phone number is [phone].  Please show the [facility] at check-in to the Emergency Department and triage nurse.

## 2017-02-04 NOTE — Progress Notes (Signed)
[place] [phone]   [phone]  OFFICE PROGRESS NOTE  [name], MD [place] [address]  DIAGNOSIS: Metastatic non-small cell lung cancer initially diagnosed as locally advanced stage IIIB with a right Pancoast tumor involving the vertebral body as well as prominent canal invasion with spinal cord compression in October 2007. The patient also has metastatic disease to the brain in April 2008.  PRIOR THERAPY: 1. Status post concurrent chemoradiation with weekly carboplatin and paclitaxel, last dose was given November 18, 2005. 2. 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 3. Status post gamma knife radiotherapy to a solitary brain lesion located in the superior frontal area of the brain at [facility] in April of 2008. 4. Status post palliative radiotherapy to the lateral abdominal wall metastatic lesion under the care of Dr. Lisbeth Renshaw, completed March of 2009. 5. Status post 6 cycles of systemic chemotherapy with carboplatin and Alimta. Last dose was given July 26, 2007 with disease stabilization. 6. Gamma knife stereotactic radiotherapy to 2 brain lesions one involving the right frontal dural based as well as right parietal lesion performed on 05/07/2012 under the care of Dr. Vallarie Mare at [facility].  CURRENT THERAPY: Maintenance systemic chemotherapy with Alimta 500 MG/M2 every 3 weeks, status post 163 cycles.  INTERVAL HISTORY: Danielle [name] 67 y.o. female returns to the clinic today for follow-up visit accompanied by her daughter.  The patient is feeling fine today with no specific complaints.  She tolerated the last cycle of her maintenance treatment fairly well.  She denied having any chest pain, shortness of breath, cough or hemoptysis.  She denied having any fever or chills.  She has no nausea, vomiting, diarrhea or  constipation.  She is here today for evaluation before starting cycle #164.   MEDICAL HISTORY: Past Medical History:  Diagnosis Date  . Anemia   . 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 CVA (cerebral vascular accident) ([place]) 04/2014   pt states had 2 cva's within 2 wks  . DJD (degenerative joint disease), cervical   . Fibromyalgia   . H/O: pneumonia   . History of tobacco abuse quit 10/08   on nicotine patch  . Hypertension   . Hypokalemia   . Lung cancer ([place]) dx'd 09/2005   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 had chemo and radiation for lung ca  . Thrush 12/11/2010    ALLERGIES:  is allergic to contrast media [iodinated diagnostic agents]; iohexol; sulfa drugs cross reactors; and co-trimoxazole injection [sulfamethoxazole-trimethoprim].  MEDICATIONS:  Current Outpatient Medications  Medication Sig Dispense Refill  . ALPRAZolam (XANAX) 0.25 MG tablet Take 1 tablet (0.25 mg total) by mouth at bedtime as needed. for sleep 30 tablet 0  . amLODipine (NORVASC) 10 MG tablet TAKE 1 TABLET (10 MG TOTAL) BY MOUTH DAILY. 90 tablet 1  . aspirin 325 MG EC tablet TAKE 1 TABLET BY MOUTH EVERY DAY 90 tablet 0  . atorvastatin (LIPITOR) 40 MG tablet TAKE 1 TABLET BY MOUTH EVERY DAY AT 6 PM 90 tablet 3  . FeFum-FePoly-FA-B Cmp-C-Biot (INTEGRA PLUS) CAPS Take 1 capsule by mouth every morning. 90 capsule 1  . fluticasone (FLONASE) 50 MCG/ACT nasal spray Place 1-2 sprays into both nostrils daily as needed. 16 g 5  . folic acid (FOLVITE) 1 MG tablet TAKE 1 TABLET BY MOUTH EVERY DAY 90 tablet 1  . furosemide (LASIX)  20 MG tablet TAKE 1 TABLET (20 MG TOTAL) BY MOUTH DAILY. AS NEEDED FOR SWELLING. 20 tablet 0  . lacosamide (VIMPAT) 50 MG TABS tablet Take by mouth.    . lidocaine-prilocaine (EMLA) cream Apply to Port-A-Cath as directed 1-2 hours prior to chemotherapy 30 g 1  . lisinopril (PRINIVIL,ZESTRIL) 20 MG tablet TAKE 2 TABLETS (40 MG TOTAL) BY MOUTH DAILY. 180 tablet 1  .  morphine (MS CONTIN) 60 MG 12 hr tablet Take 1 tablet (60 mg total) by mouth 2 (two) times daily. 60 tablet 0  . morphine (MSIR) 30 MG tablet Take 1 tablet (30 mg total) by mouth every 4 (four) hours as needed (breakthrough pain). 60 tablet 0  . Olopatadine HCl 0.2 % SOLN Apply 1 drop to eye daily. 2.5 mL 11  . ondansetron (ZOFRAN) 8 MG tablet TAKE 1 TABLET (8 MG TOTAL) BY MOUTH 2 (TWO) TIMES DAILY AS NEEDED FOR NAUSEA OR VOMITING. 30 tablet 0  . predniSONE (DELTASONE) 50 MG tablet 1 tablet by mouth 13 hours ,7 hours and 1 hour  before CT scan 3 tablet 3  . prochlorperazine (COMPAZINE) 10 MG tablet Take 10 mg by mouth.    . triamcinolone (KENALOG) 0.025 % ointment Apply 1 application topically 2 (two) times daily. 30 g 0  . diphenhydrAMINE (BENADRYL) 25 MG tablet Take 1 tablet (25 mg total) by mouth once. 2 hours before CT scan on 09/18/2015 10 tablet 0   No current facility-administered medications for this visit.     SURGICAL HISTORY:  Past Surgical History:  Procedure Laterality Date  . CERVICAL LAMINECTOMY  1995  . KNEE SURGERY  1990   Left x 2  . 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 LOOP RECORDER IMPLANT N/A 04/19/2014   Procedure: LOOP RECORDER IMPLANT;  Surgeon: Thompson Grayer, MD;  Location: [facility] CATH LAB;  Service: Cardiovascular;  Laterality: N/A;  . OTHER SURGICAL HISTORY  2008   Gamma knife surgery to remove brain met   . PORTACATH PLACEMENT  -[name]   TIP IN CAVOATRIAL JUNCTION  . TEE WITHOUT CARDIOVERSION N/A 04/19/2014   Procedure: TRANSESOPHAGEAL ECHOCARDIOGRAM (TEE);  Surgeon: Larey Dresser, MD;  Location: [facility] ENDOSCOPY;  Service: Cardiovascular;  Laterality: N/A;    REVIEW OF SYSTEMS:  A comprehensive review of systems was negative.   PHYSICAL EXAMINATION: General appearance: alert, cooperative and no distress Head: Normocephalic, without obvious abnormality, atraumatic Neck: no adenopathy, no JVD, supple, symmetrical, trachea midline  and thyroid not enlarged, symmetric, no tenderness/mass/nodules Lymph nodes: Cervical, supraclavicular, and axillary nodes normal. Resp: clear to auscultation bilaterally Back: symmetric, no curvature. ROM normal. No CVA tenderness. Cardio: regular rate and rhythm, S1, S2 normal, no murmur, click, rub or gallop GI: soft, non-tender; bowel sounds normal; no masses,  no organomegaly Extremities: extremities normal, atraumatic, no cyanosis or edema  ECOG PERFORMANCE STATUS: 0 - Asymptomatic  Blood pressure (!) 130/55, pulse 89, temperature (!) 97.5 F (36.4 C), temperature source Oral, resp. rate 17, height 5' 3" (1.6 m), weight 120 lb 9.6 oz (54.7 kg), SpO2 92 %.  LABORATORY DATA: Lab Results  Component Value Date   WBC 8.8 02/04/2017   HGB 11.6 02/04/2017   HCT 35.4 02/04/2017   MCV 96.3 02/04/2017   PLT 388 02/04/2017      Chemistry      Component Value Date/Time   NA 139 02/04/2017 0820   NA 142 12/24/2016 0816   K 4.0 02/04/2017  0820   K 4.4 12/24/2016 0816   CL 103 02/04/2017 0820   CL 106 06/30/2012 1004   CO2 29 02/04/2017 0820   CO2 28 12/24/2016 0816   BUN 18 02/04/2017 0820   BUN 15.8 12/24/2016 0816   CREATININE 1.07 02/04/2017 0820   CREATININE 1.1 12/24/2016 0816      Component Value Date/Time   CALCIUM 9.4 02/04/2017 0820   CALCIUM 9.6 12/24/2016 0816   ALKPHOS 76 02/04/2017 0820   ALKPHOS 80 12/24/2016 0816   AST 20 02/04/2017 0820   AST 20 12/24/2016 0816   ALT 11 02/04/2017 0820   ALT 11 12/24/2016 0816   BILITOT 0.4 02/04/2017 0820   BILITOT 0.34 12/24/2016 0816       RADIOGRAPHIC STUDIES: Ct Chest W Contrast  Result Date: 01/10/2017 CLINICAL DATA:  Non-small cell lung cancer restaging, prior metastatic disease to the spine, brain, and omentum. EXAM: CT CHEST, ABDOMEN, AND PELVIS WITH CONTRAST TECHNIQUE: Multidetector CT imaging of the chest, abdomen and pelvis was performed following the standard protocol during bolus administration of  intravenous contrast. CONTRAST:  97m ISOVUE-300 IOPAMIDOL (ISOVUE-300) INJECTION 61% COMPARISON:  Multiple exams, including 09/06/2016 FINDINGS: CT CHEST FINDINGS Cardiovascular: Right Port-A-Cath tip: Cavoatrial junction. Coronary, aortic arch, and branch vessel atherosclerotic vascular disease. Loop recorder device noted. Mediastinum/Nodes: No pathologic thoracic adenopathy. Lungs/Pleura: Reduced size of the probably exudative right pleural effusion, currently small in size. Stable nodular pleural thickening at the right lung apex with adjacent interstitial accentuation and pleural irregularity. There is some mild left posterior apical pleural scarring as well. No new or progressive nodularity or progressive masslike appearance at the right lung apex. Frothy material in the bronchus intermedius and right lower lobe tracheobronchial tree compatible with mucus. Severe centrilobular emphysema. Mild bilateral airway thickening. Airway plugging in the left lower lobe has improved compared to the prior exam. Musculoskeletal: Stable sclerosis and slight irregularity of the right first rib adjacent to the pleural thickening. Stable appearance of vertebra plana at the T3 vertebral level and vertebral augmentations at T4 and T7. Stable appearance of chronic avascular necrosis along both humeral heads without flattening. CT ABDOMEN PELVIS FINDINGS Hepatobiliary: Unremarkable Pancreas: Unremarkable Spleen: Unremarkable Adrenals/Urinary Tract: Unremarkable Stomach/Bowel: Prominent stool throughout the colon favors constipation. Vascular/Lymphatic: Aortoiliac atherosclerotic vascular disease. No pathologic adenopathy is observed. Reproductive: Unremarkable Other: Subtle lower presacral edema, chronically stable. 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 Mild lumbar spondylosis and degenerative disc disease. Stable subtle sclerosis posteriorly in the right L1 vertebral  body. IMPRESSION: 1. Reduced size of the probably exudative right pleural effusion. There is some faint enhancement along the parietal pleural margin but without obvious tumor nodularity along the pleural effusion. 2. Stable pleural thickening, scarring, and irregularity at the right lung apex, much of this probably therapy related. Stable mild adjacent sclerosis in the right first rib. 3. No findings of new metastatic disease in the chest, or abdomen/pelvis. 4. Other imaging findings of potential clinical significance: Aortic Atherosclerosis (ICD10-I70.0) and Emphysema (ICD10-J43.9). Coronary atherosclerosis. Mucus and mild plugging in the right tracheobronchial tree. Stable vertebra plana at T 3. Stable vertebral augmentations at T4 and T7. Stable faint sclerosis posteriorly in the right L1 vertebral body. Prominent stool throughout the colon favors constipation. 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 Electronically Signed   By: WVan ClinesM.D.   On: 01/10/2017 10:55   Ct Abdomen Pelvis W Contrast  Result Date: 01/10/2017 CLINICAL DATA:  Non-small cell  lung cancer restaging, prior metastatic disease to the spine, brain, and omentum. EXAM: CT CHEST, ABDOMEN, AND PELVIS WITH CONTRAST TECHNIQUE: Multidetector CT imaging of the chest, abdomen and pelvis was performed following the standard protocol during bolus administration of intravenous contrast. CONTRAST:  11m ISOVUE-300 IOPAMIDOL (ISOVUE-300) INJECTION 61% COMPARISON:  Multiple exams, including 09/06/2016 FINDINGS: CT CHEST FINDINGS Cardiovascular: Right Port-A-Cath tip: Cavoatrial junction. Coronary, aortic arch, and branch vessel atherosclerotic vascular disease. Loop recorder device noted. Mediastinum/Nodes: No pathologic thoracic adenopathy. Lungs/Pleura: Reduced size of the probably exudative right pleural effusion, currently small in size. Stable nodular pleural thickening at the right lung apex with adjacent interstitial  accentuation and pleural irregularity. There is some mild left posterior apical pleural scarring as well. No new or progressive nodularity or progressive masslike appearance at the right lung apex. Frothy material in the bronchus intermedius and right lower lobe tracheobronchial tree compatible with mucus. Severe centrilobular emphysema. Mild bilateral airway thickening. Airway plugging in the left lower lobe has improved compared to the prior exam. Musculoskeletal: Stable sclerosis and slight irregularity of the right first rib adjacent to the pleural thickening. Stable appearance of vertebra plana at the T3 vertebral level and vertebral augmentations at T4 and T7. Stable appearance of chronic avascular necrosis along both humeral heads without flattening. CT ABDOMEN PELVIS FINDINGS Hepatobiliary: Unremarkable Pancreas: Unremarkable Spleen: Unremarkable Adrenals/Urinary Tract: Unremarkable Stomach/Bowel: Prominent stool throughout the colon favors constipation. Vascular/Lymphatic: Aortoiliac atherosclerotic vascular disease. No pathologic adenopathy is observed. Reproductive: Unremarkable Other: Subtle lower presacral edema, chronically stable. 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 Mild lumbar spondylosis and degenerative disc disease. Stable subtle sclerosis posteriorly in the right L1 vertebral body. IMPRESSION: 1. Reduced size of the probably exudative right pleural effusion. There is some faint enhancement along the parietal pleural margin but without obvious tumor nodularity along the pleural effusion. 2. Stable pleural thickening, scarring, and irregularity at the right lung apex, much of this probably therapy related. Stable mild adjacent sclerosis in the right first rib. 3. No findings of new metastatic disease in the chest, or abdomen/pelvis. 4. Other imaging findings of potential clinical significance: Aortic Atherosclerosis (ICD10-I70.0) and  Emphysema (ICD10-J43.9). Coronary atherosclerosis. Mucus and mild plugging in the right tracheobronchial tree. Stable vertebra plana at T 3. Stable vertebral augmentations at T4 and T7. Stable faint sclerosis posteriorly in the right L1 vertebral body. Prominent stool throughout the colon favors constipation. 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 Electronically Signed   By: WVan ClinesM.D.   On: 01/10/2017 10:55    ASSESSMENT AND PLAN: This is a very pleasant 67years old white female with metastatic non-small cell lung cancer diagnosed in October 2007 status post concurrent chemoradiation followed by consolidation chemotherapy and she is currently on maintenance treatment with single agent Alimta status post 163 cycles. She continues to tolerate this treatment well with no concerning complaints. I recommended for her to proceed with cycle 164 today as a schedule. I will see her back for follow-up visit in 3 weeks for reevaluation before the next cycle of her treatment. For anxiety, I gave her a refill of Xanax. The patient was advised to call immediately if she has any concerning symptoms in the interval. The patient voices understanding of current disease status and treatment options and is in agreement with the current care plan. All questions were answered. The patient knows to call the clinic with any problems, questions or concerns. We can certainly see the  patient much sooner if necessary.  Disclaimer: This note was dictated with voice recognition software. Similar sounding words can inadvertently be transcribed and may not be corrected upon review.

## 2017-02-06 ENCOUNTER — Other Ambulatory Visit: Payer: Self-pay | Admitting: Medical Oncology

## 2017-02-06 DIAGNOSIS — D649 Anemia, unspecified: Secondary | ICD-10-CM

## 2017-02-06 MED ORDER — INTEGRA PLUS PO CAPS: 1.0000 | ORAL_CAPSULE | Freq: Every morning | ORAL | 1 refills | Status: DC

## 2017-02-10 ENCOUNTER — Other Ambulatory Visit: Payer: Self-pay | Admitting: Medical Oncology

## 2017-02-10 DIAGNOSIS — C3410 Malignant neoplasm of upper lobe, unspecified bronchus or lung: Secondary | ICD-10-CM

## 2017-02-10 DIAGNOSIS — C3490 Malignant neoplasm of unspecified part of unspecified bronchus or lung: Secondary | ICD-10-CM

## 2017-02-10 MED ORDER — MORPHINE SULFATE ER 60 MG PO TBCR: 60.0000 mg | EXTENDED_RELEASE_TABLET | Freq: Two times a day (BID) | ORAL | 0 refills | Status: DC

## 2017-02-10 MED ORDER — MORPHINE SULFATE 30 MG PO TABS: 30.0000 mg | ORAL_TABLET | ORAL | 0 refills | Status: DC | PRN

## 2017-02-16 ENCOUNTER — Other Ambulatory Visit: Payer: Self-pay | Admitting: Internal Medicine

## 2017-02-16 DIAGNOSIS — C7931 Secondary malignant neoplasm of brain: Secondary | ICD-10-CM

## 2017-02-21 ENCOUNTER — Encounter: Payer: Self-pay | Admitting: Pharmacist

## 2017-02-25 ENCOUNTER — Encounter: Payer: Self-pay | Admitting: Internal Medicine

## 2017-02-25 ENCOUNTER — Inpatient Hospital Stay: Payer: Medicare Other

## 2017-02-25 ENCOUNTER — Inpatient Hospital Stay: Payer: Medicare Other | Attending: Internal Medicine | Admitting: Internal Medicine

## 2017-02-25 ENCOUNTER — Telehealth: Payer: Self-pay | Admitting: Internal Medicine

## 2017-02-25 VITALS — BP 142/61 | HR 84 | Temp 97.8°F | Resp 18 | Ht 63.0 in | Wt 122.1 lb

## 2017-02-25 DIAGNOSIS — Z95828 Presence of other vascular implants and grafts: Secondary | ICD-10-CM | POA: Insufficient documentation

## 2017-02-25 DIAGNOSIS — C3411 Malignant neoplasm of upper lobe, right bronchus or lung: Secondary | ICD-10-CM | POA: Diagnosis not present

## 2017-02-25 DIAGNOSIS — G893 Neoplasm related pain (acute) (chronic): Secondary | ICD-10-CM

## 2017-02-25 DIAGNOSIS — Z5111 Encounter for antineoplastic chemotherapy: Secondary | ICD-10-CM | POA: Diagnosis not present

## 2017-02-25 LAB — CBC WITH DIFFERENTIAL/PLATELET
Basophils Absolute: 0 10*3/uL (ref 0.0–0.1)
Basophils Relative: 0 %
EOS PCT: 1 %
Eosinophils Absolute: 0.1 10*3/uL (ref 0.0–0.5)
HEMATOCRIT: 36.8 % (ref 34.8–46.6)
Hemoglobin: 11.6 g/dL (ref 11.6–15.9)
LYMPHS ABS: 2.1 10*3/uL (ref 0.9–3.3)
LYMPHS PCT: 21 %
MCH: 31.1 pg (ref 25.1–34.0)
MCHC: 31.5 g/dL (ref 31.5–36.0)
MCV: 98.7 fL (ref 79.5–101.0)
MONO ABS: 0.8 10*3/uL (ref 0.1–0.9)
Monocytes Relative: 8 %
Neutro Abs: 6.9 10*3/uL — ABNORMAL HIGH (ref 1.5–6.5)
Neutrophils Relative %: 70 %
PLATELETS: 379 10*3/uL (ref 145–400)
RBC: 3.73 MIL/uL (ref 3.70–5.45)
RDW: 14.6 % — AB (ref 11.2–14.5)
WBC: 10 10*3/uL (ref 3.9–10.3)

## 2017-02-25 LAB — COMPREHENSIVE METABOLIC PANEL
ALT: 11 U/L (ref 0–55)
AST: 22 U/L (ref 5–34)
Albumin: 3.1 g/dL — ABNORMAL LOW (ref 3.5–5.0)
Alkaline Phosphatase: 85 U/L (ref 40–150)
Anion gap: 8 (ref 3–11)
BILIRUBIN TOTAL: 0.2 mg/dL (ref 0.2–1.2)
BUN: 13 mg/dL (ref 7–26)
CHLORIDE: 106 mmol/L (ref 98–109)
CO2: 27 mmol/L (ref 22–29)
CREATININE: 0.84 mg/dL (ref 0.60–1.10)
Calcium: 9.6 mg/dL (ref 8.4–10.4)
GFR calc Af Amer: >60 mL/min (ref >60)
Glucose, Bld: 108 mg/dL (ref 70–140)
Potassium: 3.9 mmol/L (ref 3.5–5.1)
Sodium: 141 mmol/L (ref 136–145)
TOTAL PROTEIN: 7.2 g/dL (ref 6.4–8.3)

## 2017-02-25 MED ORDER — CYANOCOBALAMIN 1000 MCG/ML IJ SOLN
INTRAMUSCULAR | Status: AC
  Filled 2017-02-25: qty 1

## 2017-02-25 MED ORDER — HEPARIN SOD (PORK) LOCK FLUSH 100 UNIT/ML IV SOLN
500.0000 [IU] | Freq: Once | INTRAVENOUS | Status: AC | PRN
  Administered 2017-02-25: 500 [IU]
  Filled 2017-02-25: qty 5

## 2017-02-25 MED ORDER — SODIUM CHLORIDE 0.9% FLUSH
10.0000 mL | Freq: Once | INTRAVENOUS | Status: AC
  Administered 2017-02-25: 10 mL
  Filled 2017-02-25: qty 10

## 2017-02-25 MED ORDER — DEXAMETHASONE SODIUM PHOSPHATE 10 MG/ML IJ SOLN
INTRAMUSCULAR | Status: AC
  Filled 2017-02-25: qty 1

## 2017-02-25 MED ORDER — SODIUM CHLORIDE 0.9 % IV SOLN
500.0000 mg/m2 | Freq: Once | INTRAVENOUS | Status: AC
  Administered 2017-02-25: 800 mg via INTRAVENOUS
  Filled 2017-02-25: qty 20

## 2017-02-25 MED ORDER — SODIUM CHLORIDE 0.9 % IJ SOLN
10.0000 mL | INTRAMUSCULAR | Status: DC | PRN
  Filled 2017-02-25: qty 10

## 2017-02-25 MED ORDER — ONDANSETRON HCL 8 MG PO TABS
ORAL_TABLET | ORAL | Status: AC
  Filled 2017-02-25: qty 1

## 2017-02-25 MED ORDER — DEXAMETHASONE SODIUM PHOSPHATE 10 MG/ML IJ SOLN
10.0000 mg | Freq: Once | INTRAMUSCULAR | Status: AC
  Administered 2017-02-25: 10 mg via INTRAVENOUS

## 2017-02-25 MED ORDER — SODIUM CHLORIDE 0.9 % IV SOLN
Freq: Once | INTRAVENOUS | Status: AC
  Administered 2017-02-25: 11:00:00 via INTRAVENOUS

## 2017-02-25 MED ORDER — ONDANSETRON HCL 8 MG PO TABS
8.0000 mg | ORAL_TABLET | Freq: Once | ORAL | Status: AC
  Administered 2017-02-25: 8 mg via ORAL

## 2017-02-25 MED ORDER — CYANOCOBALAMIN 1000 MCG/ML IJ SOLN
1000.0000 ug | Freq: Once | INTRAMUSCULAR | Status: AC
  Administered 2017-02-25: 1000 ug via INTRAMUSCULAR

## 2017-02-25 NOTE — Telephone Encounter (Signed)
Scheduled appt per 2/19 los - patient to get an updated schedule in the treatment area.

## 2017-02-25 NOTE — Patient Instructions (Signed)
[facility] Discharge Instructions for Patients Receiving Chemotherapy  Today you received the following chemotherapy agents Alimta To help prevent nausea and vomiting after your treatment, we encourage you to take your nausea medication as directed   If you develop nausea and vomiting that is not controlled by your nausea medication, call the clinic.   BELOW ARE SYMPTOMS THAT SHOULD BE REPORTED IMMEDIATELY:  *FEVER GREATER THAN 100.5 F  *CHILLS WITH OR WITHOUT FEVER  NAUSEA AND VOMITING THAT IS NOT CONTROLLED WITH YOUR NAUSEA MEDICATION  *UNUSUAL SHORTNESS OF BREATH  *UNUSUAL BRUISING OR BLEEDING  TENDERNESS IN MOUTH AND THROAT WITH OR WITHOUT PRESENCE OF ULCERS  *URINARY PROBLEMS  *BOWEL PROBLEMS  UNUSUAL RASH Items with * indicate a potential emergency and should be followed up as soon as possible.  Feel free to call the clinic should you have any questions or concerns. The clinic phone number is [phone].  Please show the [facility] at check-in to the Emergency Department and triage nurse.

## 2017-02-25 NOTE — Progress Notes (Signed)
[place] [phone]) [phone]   [phone]  OFFICE PROGRESS NOTE  Danielle Sanders, MD [place] [address]  DIAGNOSIS: Metastatic non-small cell lung cancer initially diagnosed as locally advanced stage IIIB with a right Pancoast tumor involving the vertebral body as well as prominent canal invasion with spinal cord compression in October 2007. The patient also has metastatic disease to the brain in April 2008.  PRIOR THERAPY: 1. Status post concurrent chemoradiation with weekly carboplatin and paclitaxel, last dose was given November 18, 2005. 2. 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 3. Status post gamma knife radiotherapy to a solitary brain lesion located in the superior frontal area of the brain at [facility] in April of 2008. 4. Status post palliative radiotherapy to the lateral abdominal wall metastatic lesion under the care of Dr. Lisbeth Renshaw, completed March of 2009. 5. Status post 6 cycles of systemic chemotherapy with carboplatin and Alimta. Last dose was given July 26, 2007 with disease stabilization. 6. Gamma knife stereotactic radiotherapy to 2 brain lesions one involving the right frontal dural based as well as right parietal lesion performed on 05/07/2012 under the care of Dr. Vallarie Mare at [facility].  CURRENT THERAPY: Maintenance systemic chemotherapy with Alimta 500 MG/M2 every 3 weeks, status post 164 cycles.  INTERVAL HISTORY: Danielle [name] 67 y.o. female returns to the clinic today for follow-up visit accompanied by her daughter.  The patient is feeling fine today with no specific complaints.  She continues to tolerate her maintenance treatment with Alimta fairly well.  She denied having any chest pain, shortness breath, cough or hemoptysis.  She denied having any fever or chills.  She has no nausea, vomiting, diarrhea or  constipation.  She is here today for evaluation before starting cycle #165.    MEDICAL HISTORY: Past Medical History:  Diagnosis Date  . Anemia   . 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 CVA (cerebral vascular accident) ([place]) 04/2014   pt states had 2 cva's within 2 wks  . DJD (degenerative joint disease), cervical   . Fibromyalgia   . H/O: pneumonia   . History of tobacco abuse quit 10/08   on nicotine patch  . Hypertension   . Hypokalemia   . Lung cancer ([place]) dx'd 09/2005   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 had chemo and radiation for lung ca  . Thrush 12/11/2010    ALLERGIES:  is allergic to contrast media [iodinated diagnostic agents]; iohexol; sulfa drugs cross reactors; and co-trimoxazole injection [sulfamethoxazole-trimethoprim].  MEDICATIONS:  Current Outpatient Medications  Medication Sig Dispense Refill  . ALPRAZolam (XANAX) 0.25 MG tablet Take 1 tablet (0.25 mg total) by mouth at bedtime as needed. for sleep 30 tablet 0  . amLODipine (NORVASC) 10 MG tablet TAKE 1 TABLET (10 MG TOTAL) BY MOUTH DAILY. 90 tablet 1  . aspirin 325 MG EC tablet TAKE 1 TABLET BY MOUTH EVERY DAY 90 tablet 0  . atorvastatin (LIPITOR) 40 MG tablet TAKE 1 TABLET BY MOUTH EVERY DAY AT 6 PM 90 tablet 3  . FeFum-FePoly-FA-B Cmp-C-Biot (INTEGRA PLUS) CAPS Take 1 capsule by mouth every morning. 90 capsule 1  . fluticasone (FLONASE) 50 MCG/ACT nasal spray Place 1-2 sprays into both nostrils daily as needed. 16 g 5  . folic acid (FOLVITE) 1 MG tablet TAKE 1 TABLET BY MOUTH EVERY DAY 90 tablet 1  . furosemide (LASIX)  20 MG tablet TAKE 1 TABLET (20 MG TOTAL) BY MOUTH DAILY. AS NEEDED FOR SWELLING. 20 tablet 0  . lacosamide (VIMPAT) 50 MG TABS tablet Take by mouth.    . lidocaine-prilocaine (EMLA) cream Apply to Port-A-Cath as directed 1-2 hours prior to chemotherapy 30 g 1  . lisinopril (PRINIVIL,ZESTRIL) 20 MG tablet TAKE 2 TABLETS (40 MG TOTAL) BY MOUTH DAILY. 180 tablet 1  .  morphine (MS CONTIN) 60 MG 12 hr tablet Take 1 tablet (60 mg total) by mouth 2 (two) times daily. 60 tablet 0  . morphine (MSIR) 30 MG tablet Take 1 tablet (30 mg total) by mouth every 4 (four) hours as needed (breakthrough pain). 60 tablet 0  . Olopatadine HCl 0.2 % SOLN Apply 1 drop to eye daily. 2.5 mL 11  . ondansetron (ZOFRAN) 8 MG tablet TAKE 1 TABLET (8 MG TOTAL) BY MOUTH 2 (TWO) TIMES DAILY AS NEEDED FOR NAUSEA OR VOMITING. 30 tablet 0  . predniSONE (DELTASONE) 50 MG tablet 1 tablet by mouth 13 hours ,7 hours and 1 hour  before CT scan 3 tablet 3  . prochlorperazine (COMPAZINE) 10 MG tablet Take 10 mg by mouth.    . triamcinolone (KENALOG) 0.025 % ointment Apply 1 application topically 2 (two) times daily. 30 g 0  . diphenhydrAMINE (BENADRYL) 25 MG tablet Take 1 tablet (25 mg total) by mouth once. 2 hours before CT scan on 09/18/2015 10 tablet 0   No current facility-administered medications for this visit.    Facility-Administered Medications Ordered in Other Visits  Medication Dose Route Frequency Provider Last Rate Last Dose  . sodium chloride flush (NS) 0.9 % injection 10 mL  10 mL Intracatheter Once Curt Bears, MD        SURGICAL HISTORY:  Past Surgical History:  Procedure Laterality Date  . CERVICAL LAMINECTOMY  1995  . KNEE SURGERY  1990   Left x 2  . 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 LOOP RECORDER IMPLANT N/A 04/19/2014   Procedure: LOOP RECORDER IMPLANT;  Surgeon: Thompson Grayer, MD;  Location: [facility] CATH LAB;  Service: Cardiovascular;  Laterality: N/A;  . OTHER SURGICAL HISTORY  2008   Gamma knife surgery to remove brain met   . PORTACATH PLACEMENT  -[name]   TIP IN CAVOATRIAL JUNCTION  . TEE WITHOUT CARDIOVERSION N/A 04/19/2014   Procedure: TRANSESOPHAGEAL ECHOCARDIOGRAM (TEE);  Surgeon: Larey Dresser, MD;  Location: [facility] ENDOSCOPY;  Service: Cardiovascular;  Laterality: N/A;    REVIEW OF SYSTEMS:  A comprehensive review of  systems was negative.   PHYSICAL EXAMINATION: General appearance: alert, cooperative and no distress Head: Normocephalic, without obvious abnormality, atraumatic Neck: no adenopathy, no JVD, supple, symmetrical, trachea midline and thyroid not enlarged, symmetric, no tenderness/mass/nodules Lymph nodes: Cervical, supraclavicular, and axillary nodes normal. Resp: clear to auscultation bilaterally Back: symmetric, no curvature. ROM normal. No CVA tenderness. Cardio: regular rate and rhythm, S1, S2 normal, no murmur, click, rub or gallop GI: soft, non-tender; bowel sounds normal; no masses,  no organomegaly Extremities: extremities normal, atraumatic, no cyanosis or edema  ECOG PERFORMANCE STATUS: 0 - Asymptomatic  Blood pressure (!) 142/61, pulse 84, temperature 97.8 F (36.6 C), temperature source Oral, resp. rate 18, height '5\' 3"'  (1.6 m), weight 122 lb 1.6 oz (55.4 kg), SpO2 95 %.  LABORATORY DATA: Lab Results  Component Value Date   WBC 10.0 02/25/2017   HGB 11.6 02/25/2017   HCT 36.8 02/25/2017  MCV 98.7 02/25/2017   PLT 379 02/25/2017      Chemistry      Component Value Date/Time   NA 141 02/25/2017 0910   NA 142 12/24/2016 0816   K 3.9 02/25/2017 0910   K 4.4 12/24/2016 0816   CL 106 02/25/2017 0910   CL 106 06/30/2012 1004   CO2 27 02/25/2017 0910   CO2 28 12/24/2016 0816   BUN 13 02/25/2017 0910   BUN 15.8 12/24/2016 0816   CREATININE 0.84 02/25/2017 0910   CREATININE 1.1 12/24/2016 0816      Component Value Date/Time   CALCIUM 9.6 02/25/2017 0910   CALCIUM 9.6 12/24/2016 0816   ALKPHOS 85 02/25/2017 0910   ALKPHOS 80 12/24/2016 0816   AST 22 02/25/2017 0910   AST 20 12/24/2016 0816   ALT 11 02/25/2017 0910   ALT 11 12/24/2016 0816   BILITOT 0.2 02/25/2017 0910   BILITOT 0.34 12/24/2016 0816       RADIOGRAPHIC STUDIES: No results found.  ASSESSMENT AND PLAN: This is a very pleasant 67 years old white female with metastatic non-small cell lung  cancer diagnosed in October 2007 status post concurrent chemoradiation followed by consolidation chemotherapy and she is currently on maintenance treatment with single agent Alimta status post 164 cycles. The patient tolerated the last cycle of her treatment fairly well. I recommended for her to proceed with her current maintenance therapy and she will receive cycle #165 today. I also recommend for the patient to discontinue treatment with Xanax because of the safety concern using it with opioid.  She will use over-the-counter Tylenol PM or melatonin for sleep. The patient will come back for follow-up visit in 3 weeks for evaluation before the next cycle of her treatment. She was advised to call immediately if she has any concerning symptoms in the interval. The patient voices understanding of current disease status and treatment options and is in agreement with the current care plan. All questions were answered. The patient knows to call the clinic with any problems, questions or concerns. We can certainly see the patient much sooner if necessary.  Disclaimer: This note was dictated with voice recognition software. Similar sounding words can inadvertently be transcribed and may not be corrected upon review.

## 2017-03-05 ENCOUNTER — Other Ambulatory Visit: Payer: Self-pay | Admitting: Family Medicine

## 2017-03-09 ENCOUNTER — Other Ambulatory Visit: Payer: Self-pay | Admitting: Family Medicine

## 2017-03-15 ENCOUNTER — Other Ambulatory Visit: Payer: Self-pay | Admitting: Internal Medicine

## 2017-03-15 DIAGNOSIS — C3411 Malignant neoplasm of upper lobe, right bronchus or lung: Secondary | ICD-10-CM

## 2017-03-15 DIAGNOSIS — C7931 Secondary malignant neoplasm of brain: Secondary | ICD-10-CM

## 2017-03-18 ENCOUNTER — Encounter: Payer: Self-pay | Admitting: Internal Medicine

## 2017-03-18 ENCOUNTER — Inpatient Hospital Stay: Payer: Medicare Other | Attending: Internal Medicine | Admitting: Internal Medicine

## 2017-03-18 ENCOUNTER — Inpatient Hospital Stay: Payer: Medicare Other

## 2017-03-18 ENCOUNTER — Telehealth: Payer: Self-pay | Admitting: Internal Medicine

## 2017-03-18 DIAGNOSIS — C3490 Malignant neoplasm of unspecified part of unspecified bronchus or lung: Secondary | ICD-10-CM

## 2017-03-18 DIAGNOSIS — C3411 Malignant neoplasm of upper lobe, right bronchus or lung: Secondary | ICD-10-CM | POA: Diagnosis not present

## 2017-03-18 DIAGNOSIS — Z5111 Encounter for antineoplastic chemotherapy: Secondary | ICD-10-CM | POA: Diagnosis not present

## 2017-03-18 DIAGNOSIS — C7931 Secondary malignant neoplasm of brain: Secondary | ICD-10-CM | POA: Insufficient documentation

## 2017-03-18 DIAGNOSIS — C3410 Malignant neoplasm of upper lobe, unspecified bronchus or lung: Secondary | ICD-10-CM

## 2017-03-18 DIAGNOSIS — Z95828 Presence of other vascular implants and grafts: Secondary | ICD-10-CM

## 2017-03-18 LAB — CBC WITH DIFFERENTIAL/PLATELET
BASOS ABS: 0 10*3/uL (ref 0.0–0.1)
Basophils Relative: 0 %
Eosinophils Absolute: 0.1 10*3/uL (ref 0.0–0.5)
Eosinophils Relative: 1 %
HEMATOCRIT: 37.5 % (ref 34.8–46.6)
Hemoglobin: 12 g/dL (ref 11.6–15.9)
LYMPHS PCT: 18 %
Lymphs Abs: 2.6 10*3/uL (ref 0.9–3.3)
MCH: 31.4 pg (ref 25.1–34.0)
MCHC: 32 g/dL (ref 31.5–36.0)
MCV: 98.2 fL (ref 79.5–101.0)
Monocytes Absolute: 1.1 10*3/uL — ABNORMAL HIGH (ref 0.1–0.9)
Monocytes Relative: 8 %
NEUTROS ABS: 10.6 10*3/uL — AB (ref 1.5–6.5)
Neutrophils Relative %: 73 %
Platelets: 465 10*3/uL — ABNORMAL HIGH (ref 145–400)
RBC: 3.82 MIL/uL (ref 3.70–5.45)
RDW: 14.6 % — ABNORMAL HIGH (ref 11.2–14.5)
WBC: 14.4 10*3/uL — AB (ref 3.9–10.3)

## 2017-03-18 LAB — COMPREHENSIVE METABOLIC PANEL
ALT: 12 U/L (ref 0–55)
AST: 20 U/L (ref 5–34)
Albumin: 3.2 g/dL — ABNORMAL LOW (ref 3.5–5.0)
Alkaline Phosphatase: 85 U/L (ref 40–150)
Anion gap: 7 (ref 3–11)
BUN: 8 mg/dL (ref 7–26)
CHLORIDE: 106 mmol/L (ref 98–109)
CO2: 26 mmol/L (ref 22–29)
Calcium: 9.7 mg/dL (ref 8.4–10.4)
Creatinine, Ser: 0.79 mg/dL (ref 0.60–1.10)
Glucose, Bld: 106 mg/dL (ref 70–140)
Potassium: 4 mmol/L (ref 3.5–5.1)
Sodium: 139 mmol/L (ref 136–145)
Total Bilirubin: 0.4 mg/dL (ref 0.2–1.2)
Total Protein: 7.6 g/dL (ref 6.4–8.3)

## 2017-03-18 MED ORDER — ONDANSETRON HCL 8 MG PO TABS: 8.0000 mg | ORAL_TABLET | Freq: Once | ORAL | Status: DC

## 2017-03-18 MED ORDER — SODIUM CHLORIDE 0.9 % IJ SOLN
10.0000 mL | INTRAMUSCULAR | Status: DC | PRN
  Administered 2017-03-18: 10 mL
  Filled 2017-03-18: qty 10

## 2017-03-18 MED ORDER — MORPHINE SULFATE ER 60 MG PO TBCR: 60.0000 mg | EXTENDED_RELEASE_TABLET | Freq: Two times a day (BID) | ORAL | 0 refills | Status: DC

## 2017-03-18 MED ORDER — MORPHINE SULFATE 30 MG PO TABS: 30.0000 mg | ORAL_TABLET | ORAL | 0 refills | Status: DC | PRN

## 2017-03-18 MED ORDER — SODIUM CHLORIDE 0.9% FLUSH
10.0000 mL | Freq: Once | INTRAVENOUS | Status: AC
  Administered 2017-03-18: 10 mL
  Filled 2017-03-18: qty 10

## 2017-03-18 MED ORDER — DEXAMETHASONE SODIUM PHOSPHATE 10 MG/ML IJ SOLN
INTRAMUSCULAR | Status: AC
  Filled 2017-03-18: qty 1

## 2017-03-18 MED ORDER — ONDANSETRON HCL 8 MG PO TABS: 8.0000 mg | ORAL_TABLET | Freq: Three times a day (TID) | ORAL | 0 refills | Status: DC | PRN

## 2017-03-18 MED ORDER — HEPARIN SOD (PORK) LOCK FLUSH 100 UNIT/ML IV SOLN
500.0000 [IU] | Freq: Once | INTRAVENOUS | Status: AC | PRN
  Administered 2017-03-18: 500 [IU]
  Filled 2017-03-18: qty 5

## 2017-03-18 MED ORDER — SODIUM CHLORIDE 0.9 % IV SOLN
Freq: Once | INTRAVENOUS | Status: AC
  Administered 2017-03-18: 11:00:00 via INTRAVENOUS

## 2017-03-18 MED ORDER — DEXAMETHASONE SODIUM PHOSPHATE 10 MG/ML IJ SOLN
10.0000 mg | Freq: Once | INTRAMUSCULAR | Status: AC
  Administered 2017-03-18: 10 mg via INTRAVENOUS

## 2017-03-18 MED ORDER — SODIUM CHLORIDE 0.9 % IV SOLN
500.0000 mg/m2 | Freq: Once | INTRAVENOUS | Status: AC
  Administered 2017-03-18: 800 mg via INTRAVENOUS
  Filled 2017-03-18: qty 20

## 2017-03-18 NOTE — Progress Notes (Signed)
[place] [phone]   Fax:(336) [phone]  OFFICE PROGRESS NOTE  [name], MD [place] [address]  DIAGNOSIS: Metastatic non-small cell lung cancer initially diagnosed as locally advanced stage IIIB with a right Pancoast tumor involving the vertebral body as well as prominent canal invasion with spinal cord compression in October 2007. The patient also has metastatic disease to the brain in April 2008.  PRIOR THERAPY: 1. Status post concurrent chemoradiation with weekly carboplatin and paclitaxel, last dose was given November 18, 2005. 2. 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 3. Status post gamma knife radiotherapy to a solitary brain lesion located in the superior frontal area of the brain at [facility] in April of 2008. 4. Status post palliative radiotherapy to the lateral abdominal wall metastatic lesion under the care of Dr. Lisbeth Renshaw, completed March of 2009. 5. Status post 6 cycles of systemic chemotherapy with carboplatin and Alimta. Last dose was given July 26, 2007 with disease stabilization. 6. Gamma knife stereotactic radiotherapy to 2 brain lesions one involving the right frontal dural based as well as right parietal lesion performed on 05/07/2012 under the care of Dr. Vallarie Mare at [facility].  CURRENT THERAPY: Maintenance systemic chemotherapy with Alimta 500 MG/M2 every 3 weeks, status post 165 cycles.  INTERVAL HISTORY: Danielle [name] 67 y.o. female returns to the clinic today for follow-up visit accompanied by her daughter.  The patient is feeling fine today with no specific complaints.  She denied having any chest pain, shortness of breath, cough or hemoptysis.  She denied having any fever or chills.  She has no nausea, vomiting, diarrhea or constipation.  She continues to tolerate her treatment with maintenance Alimta  fairly well.  She is here today for evaluation before starting cycle #166.    MEDICAL HISTORY: Past Medical History:  Diagnosis Date  . Anemia   . 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 CVA (cerebral vascular accident) ([place]) 04/2014   pt states had 2 cva's within 2 wks  . DJD (degenerative joint disease), cervical   . Fibromyalgia   . H/O: pneumonia   . History of tobacco abuse quit 10/08   on nicotine patch  . Hypertension   . Hypokalemia   . Lung cancer ([place]) dx'd 09/2005   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 had chemo and radiation for lung ca  . Thrush 12/11/2010    ALLERGIES:  is allergic to contrast media [iodinated diagnostic agents]; iohexol; sulfa drugs cross reactors; and co-trimoxazole injection [sulfamethoxazole-trimethoprim].  MEDICATIONS:  Current Outpatient Medications  Medication Sig Dispense Refill  . amLODipine (NORVASC) 10 MG tablet TAKE 1 TABLET (10 MG TOTAL) BY MOUTH DAILY. 90 tablet 1  . aspirin 325 MG EC tablet TAKE 1 TABLET BY MOUTH EVERY DAY 90 tablet 0  . atorvastatin (LIPITOR) 40 MG tablet TAKE 1 TABLET BY MOUTH EVERY DAY AT 6 PM 90 tablet 3  . FeFum-FePoly-FA-B Cmp-C-Biot (INTEGRA PLUS) CAPS Take 1 capsule by mouth every morning. 90 capsule 1  . fluticasone (FLONASE) 50 MCG/ACT nasal spray Place 1-2 sprays into both nostrils daily as needed. 16 g 5  . folic acid (FOLVITE) 1 MG tablet TAKE 1 TABLET BY MOUTH EVERY DAY 90 tablet 1  . furosemide (LASIX) 20 MG tablet TAKE 1 TABLET (20 MG TOTAL) BY MOUTH DAILY. AS NEEDED FOR SWELLING. 20 tablet 0  . lacosamide (VIMPAT)  50 MG TABS tablet Take by mouth.    . lidocaine-prilocaine (EMLA) cream Apply to Port-A-Cath as directed 1-2 hours prior to chemotherapy 30 g 1  . lisinopril (PRINIVIL,ZESTRIL) 20 MG tablet TAKE 2 TABLETS (40 MG TOTAL) BY MOUTH DAILY. 180 tablet 1  . morphine (MS CONTIN) 60 MG 12 hr tablet Take 1 tablet (60 mg total) by mouth 2 (two) times daily. 60 tablet 0  . morphine  (MSIR) 30 MG tablet Take 1 tablet (30 mg total) by mouth every 4 (four) hours as needed (breakthrough pain). 60 tablet 0  . Olopatadine HCl 0.2 % SOLN Apply 1 drop to eye daily. 2.5 mL 11  . ondansetron (ZOFRAN) 8 MG tablet TAKE 1 TABLET (8 MG TOTAL) BY MOUTH 2 (TWO) TIMES DAILY AS NEEDED FOR NAUSEA OR VOMITING. 30 tablet 0  . predniSONE (DELTASONE) 50 MG tablet 1 tablet by mouth 13 hours ,7 hours and 1 hour  before CT scan 3 tablet 3  . prochlorperazine (COMPAZINE) 10 MG tablet Take 10 mg by mouth.    . triamcinolone (KENALOG) 0.025 % ointment Apply 1 application topically 2 (two) times daily. 30 g 0  . diphenhydrAMINE (BENADRYL) 25 MG tablet Take 1 tablet (25 mg total) by mouth once. 2 hours before CT scan on 09/18/2015 10 tablet 0   No current facility-administered medications for this visit.     SURGICAL HISTORY:  Past Surgical History:  Procedure Laterality Date  . CERVICAL LAMINECTOMY  1995  . KNEE SURGERY  1990   Left x 2  . 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 LOOP RECORDER IMPLANT N/A 04/19/2014   Procedure: LOOP RECORDER IMPLANT;  Surgeon: Thompson Grayer, MD;  Location: [facility] CATH LAB;  Service: Cardiovascular;  Laterality: N/A;  . OTHER SURGICAL HISTORY  2008   Gamma knife surgery to remove brain met   . PORTACATH PLACEMENT  -[name]   TIP IN CAVOATRIAL JUNCTION  . TEE WITHOUT CARDIOVERSION N/A 04/19/2014   Procedure: TRANSESOPHAGEAL ECHOCARDIOGRAM (TEE);  Surgeon: Larey Dresser, MD;  Location: [facility] ENDOSCOPY;  Service: Cardiovascular;  Laterality: N/A;    REVIEW OF SYSTEMS:  A comprehensive review of systems was negative.   PHYSICAL EXAMINATION: General appearance: alert, cooperative and no distress Head: Normocephalic, without obvious abnormality, atraumatic Neck: no adenopathy, no JVD, supple, symmetrical, trachea midline and thyroid not enlarged, symmetric, no tenderness/mass/nodules Lymph nodes: Cervical, supraclavicular, and axillary nodes  normal. Resp: clear to auscultation bilaterally Back: symmetric, no curvature. ROM normal. No CVA tenderness. Cardio: regular rate and rhythm, S1, S2 normal, no murmur, click, rub or gallop GI: soft, non-tender; bowel sounds normal; no masses,  no organomegaly Extremities: extremities normal, atraumatic, no cyanosis or edema  ECOG PERFORMANCE STATUS: 0 - Asymptomatic  Blood pressure (!) 144/62, pulse 100, temperature 98.2 F (36.8 C), resp. rate 20, height 5' 3" (1.6 m), weight 116 lb 12.8 oz (53 kg), SpO2 95 %.  LABORATORY DATA: Lab Results  Component Value Date   WBC 14.4 (H) 03/18/2017   HGB 12.0 03/18/2017   HCT 37.5 03/18/2017   MCV 98.2 03/18/2017   PLT 465 (H) 03/18/2017      Chemistry      Component Value Date/Time   NA 141 02/25/2017 0910   NA 142 12/24/2016 0816   K 3.9 02/25/2017 0910   K 4.4 12/24/2016 0816   CL 106 02/25/2017 0910   CL 106 06/30/2012 1004   CO2 27 02/25/2017 0910  CO2 28 12/24/2016 0816   BUN 13 02/25/2017 0910   BUN 15.8 12/24/2016 0816   CREATININE 0.84 02/25/2017 0910   CREATININE 1.1 12/24/2016 0816      Component Value Date/Time   CALCIUM 9.6 02/25/2017 0910   CALCIUM 9.6 12/24/2016 0816   ALKPHOS 85 02/25/2017 0910   ALKPHOS 80 12/24/2016 0816   AST 22 02/25/2017 0910   AST 20 12/24/2016 0816   ALT 11 02/25/2017 0910   ALT 11 12/24/2016 0816   BILITOT 0.2 02/25/2017 0910   BILITOT 0.34 12/24/2016 0816       RADIOGRAPHIC STUDIES: No results found.  ASSESSMENT AND PLAN: This is a very pleasant 67 years old white female with metastatic non-small cell lung cancer diagnosed in October 2007 status post concurrent chemoradiation followed by consolidation chemotherapy and she is currently on maintenance treatment with single agent Alimta status post 165 cycles. The patient continues to tolerate her treatment fairly well with no concerning complaints. I recommended for her to proceed with cycle 166 today as a scheduled. For pain  management I gave her a refill of MS Contin as well as morphine sulfate. For the intermittent nausea I will give her a refill of Zofran. She will come back for follow-up visit in 6 weeks for evaluation before starting the next cycle of her treatment. The patient was advised to call immediately if she has any concerning symptoms in the interval.  The patient voices understanding of current disease status and treatment options and is in agreement with the current care plan. All questions were answered. The patient knows to call the clinic with any problems, questions or concerns. We can certainly see the patient much sooner if necessary.  Disclaimer: This note was dictated with voice recognition software. Similar sounding words can inadvertently be transcribed and may not be corrected upon review.

## 2017-03-18 NOTE — Telephone Encounter (Signed)
Gave patient calendar with appts per 3/12 los.

## 2017-03-18 NOTE — Patient Instructions (Signed)
[place] Discharge Instructions for Patients Receiving Chemotherapy  Today you received the following chemotherapy agents Alimta To help prevent nausea and vomiting after your treatment, we encourage you to take your nausea medication as directed   If you develop nausea and vomiting that is not controlled by your nausea medication, call the clinic.   BELOW ARE SYMPTOMS THAT SHOULD BE REPORTED IMMEDIATELY:  *FEVER GREATER THAN 100.5 F  *CHILLS WITH OR WITHOUT FEVER  NAUSEA AND VOMITING THAT IS NOT CONTROLLED WITH YOUR NAUSEA MEDICATION  *UNUSUAL SHORTNESS OF BREATH  *UNUSUAL BRUISING OR BLEEDING  TENDERNESS IN MOUTH AND THROAT WITH OR WITHOUT PRESENCE OF ULCERS  *URINARY PROBLEMS  *BOWEL PROBLEMS  UNUSUAL RASH Items with * indicate a potential emergency and should be followed up as soon as possible.  Feel free to call the clinic should you have any questions or concerns. The clinic phone number is [phone].  Please show the [place] at check-in to the Emergency Department and triage nurse.

## 2017-04-08 ENCOUNTER — Inpatient Hospital Stay: Payer: Medicare Other

## 2017-04-08 ENCOUNTER — Inpatient Hospital Stay: Payer: Medicare Other | Attending: Internal Medicine | Admitting: Internal Medicine

## 2017-04-08 ENCOUNTER — Encounter: Payer: Self-pay | Admitting: Internal Medicine

## 2017-04-08 ENCOUNTER — Telehealth: Payer: Self-pay | Admitting: Internal Medicine

## 2017-04-08 DIAGNOSIS — C3411 Malignant neoplasm of upper lobe, right bronchus or lung: Secondary | ICD-10-CM

## 2017-04-08 DIAGNOSIS — Z95828 Presence of other vascular implants and grafts: Secondary | ICD-10-CM

## 2017-04-08 DIAGNOSIS — C7931 Secondary malignant neoplasm of brain: Secondary | ICD-10-CM | POA: Insufficient documentation

## 2017-04-08 DIAGNOSIS — Z5111 Encounter for antineoplastic chemotherapy: Secondary | ICD-10-CM | POA: Insufficient documentation

## 2017-04-08 DIAGNOSIS — C3490 Malignant neoplasm of unspecified part of unspecified bronchus or lung: Secondary | ICD-10-CM

## 2017-04-08 LAB — COMPREHENSIVE METABOLIC PANEL
ALT: 11 U/L (ref 0–55)
AST: 18 U/L (ref 5–34)
Albumin: 2.9 g/dL — ABNORMAL LOW (ref 3.5–5.0)
Alkaline Phosphatase: 73 U/L (ref 40–150)
Anion gap: 7 (ref 3–11)
BUN: 10 mg/dL (ref 7–26)
CHLORIDE: 106 mmol/L (ref 98–109)
CO2: 26 mmol/L (ref 22–29)
CREATININE: 0.8 mg/dL (ref 0.60–1.10)
Calcium: 9.4 mg/dL (ref 8.4–10.4)
Glucose, Bld: 101 mg/dL (ref 70–140)
Potassium: 4 mmol/L (ref 3.5–5.1)
Sodium: 139 mmol/L (ref 136–145)
Total Bilirubin: 0.3 mg/dL (ref 0.2–1.2)
Total Protein: 7.1 g/dL (ref 6.4–8.3)

## 2017-04-08 LAB — CBC WITH DIFFERENTIAL/PLATELET
BASOS ABS: 0 10*3/uL (ref 0.0–0.1)
BASOS PCT: 0 %
EOS ABS: 0.1 10*3/uL (ref 0.0–0.5)
Eosinophils Relative: 1 %
HCT: 33.4 % — ABNORMAL LOW (ref 34.8–46.6)
HEMOGLOBIN: 11 g/dL — AB (ref 11.6–15.9)
LYMPHS ABS: 1.5 10*3/uL (ref 0.9–3.3)
Lymphocytes Relative: 16 %
MCH: 31.4 pg (ref 25.1–34.0)
MCHC: 33 g/dL (ref 31.5–36.0)
MCV: 95.2 fL (ref 79.5–101.0)
Monocytes Absolute: 0.8 10*3/uL (ref 0.1–0.9)
Monocytes Relative: 8 %
NEUTROS PCT: 75 %
Neutro Abs: 7.4 10*3/uL — ABNORMAL HIGH (ref 1.5–6.5)
Platelets: 406 10*3/uL — ABNORMAL HIGH (ref 145–400)
RBC: 3.5 MIL/uL — AB (ref 3.70–5.45)
RDW: 14.2 % (ref 11.2–14.5)
WBC: 9.8 10*3/uL (ref 3.9–10.3)

## 2017-04-08 MED ORDER — SODIUM CHLORIDE 0.9 % IV SOLN
500.0000 mg/m2 | Freq: Once | INTRAVENOUS | Status: AC
  Administered 2017-04-08: 800 mg via INTRAVENOUS
  Filled 2017-04-08: qty 20

## 2017-04-08 MED ORDER — SODIUM CHLORIDE 0.9% FLUSH
10.0000 mL | Freq: Once | INTRAVENOUS | Status: AC
  Administered 2017-04-08: 10 mL
  Filled 2017-04-08: qty 10

## 2017-04-08 MED ORDER — DEXAMETHASONE SODIUM PHOSPHATE 10 MG/ML IJ SOLN
10.0000 mg | Freq: Once | INTRAMUSCULAR | Status: AC
  Administered 2017-04-08: 10 mg via INTRAVENOUS

## 2017-04-08 MED ORDER — DEXAMETHASONE SODIUM PHOSPHATE 10 MG/ML IJ SOLN
INTRAMUSCULAR | Status: AC
Start: 2017-04-08 — End: 2017-04-08
  Filled 2017-04-08: qty 1

## 2017-04-08 MED ORDER — SODIUM CHLORIDE 0.9 % IV SOLN
Freq: Once | INTRAVENOUS | Status: AC
  Administered 2017-04-08: 10:00:00 via INTRAVENOUS

## 2017-04-08 MED ORDER — HEPARIN SOD (PORK) LOCK FLUSH 100 UNIT/ML IV SOLN
500.0000 [IU] | Freq: Once | INTRAVENOUS | Status: AC | PRN
  Administered 2017-04-08: 500 [IU]
  Filled 2017-04-08: qty 5

## 2017-04-08 MED ORDER — SODIUM CHLORIDE 0.9 % IJ SOLN
10.0000 mL | INTRAMUSCULAR | Status: DC | PRN
  Administered 2017-04-08: 10 mL
  Filled 2017-04-08: qty 10

## 2017-04-08 MED ORDER — ONDANSETRON HCL 8 MG PO TABS: 8.0000 mg | ORAL_TABLET | Freq: Once | ORAL | Status: DC

## 2017-04-08 MED ORDER — ONDANSETRON HCL 8 MG PO TABS: 8.0000 mg | ORAL_TABLET | Freq: Three times a day (TID) | ORAL | 0 refills | Status: DC | PRN

## 2017-04-08 NOTE — Progress Notes (Signed)
[place] [phone]   [phone]  OFFICE PROGRESS NOTE  [name], MD [place] [address]  DIAGNOSIS: Metastatic non-small cell lung cancer initially diagnosed as locally advanced stage IIIB with a right Pancoast tumor involving the vertebral body as well as prominent canal invasion with spinal cord compression in October 2007. The patient also has metastatic disease to the brain in April 2008.  PRIOR THERAPY: 1. Status post concurrent chemoradiation with weekly carboplatin and paclitaxel, last dose was given November 18, 2005. 2. 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 3. Status post gamma knife radiotherapy to a solitary brain lesion located in the superior frontal area of the brain at [facility] in April of 2008. 4. Status post palliative radiotherapy to the lateral abdominal wall metastatic lesion under the care of Dr. Lisbeth Renshaw, completed March of 2009. 5. Status post 6 cycles of systemic chemotherapy with carboplatin and Alimta. Last dose was given July 26, 2007 with disease stabilization. 6. Gamma knife stereotactic radiotherapy to 2 brain lesions one involving the right frontal dural based as well as right parietal lesion performed on 05/07/2012 under the care of Dr. Vallarie Mare at [facility].  CURRENT THERAPY: Maintenance systemic chemotherapy with Alimta 500 MG/M2 every 3 weeks, status post 166 cycles.  INTERVAL HISTORY: Danielle [name] 67 y.o. female returns to the clinic today for follow-up visit accompanied by her daughter.  The patient has no complaints today.  She denied having any chest pain, shortness of breath, cough or hemoptysis.  She denied having any nausea, vomiting, diarrhea or constipation.  She has no fever or chills.  She continues to tolerate her maintenance treatment fairly well.  She is here today for  evaluation before starting cycle #167.  MEDICAL HISTORY: Past Medical History:  Diagnosis Date  . Anemia   . 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 CVA (cerebral vascular accident) ([place]) 04/2014   pt states had 2 cva's within 2 wks  . DJD (degenerative joint disease), cervical   . Fibromyalgia   . H/O: pneumonia   . History of tobacco abuse quit 10/08   on nicotine patch  . Hypertension   . Hypokalemia   . Lung cancer ([place]) dx'd 09/2005   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 had chemo and radiation for lung ca  . Thrush 12/11/2010    ALLERGIES:  is allergic to contrast media [iodinated diagnostic agents]; iohexol; sulfa drugs cross reactors; and co-trimoxazole injection [sulfamethoxazole-trimethoprim].  MEDICATIONS:  Current Outpatient Medications  Medication Sig Dispense Refill  . amLODipine (NORVASC) 10 MG tablet TAKE 1 TABLET (10 MG TOTAL) BY MOUTH DAILY. 90 tablet 1  . aspirin 325 MG EC tablet TAKE 1 TABLET BY MOUTH EVERY DAY 90 tablet 0  . atorvastatin (LIPITOR) 40 MG tablet TAKE 1 TABLET BY MOUTH EVERY DAY AT 6 PM 90 tablet 3  . diphenhydrAMINE (BENADRYL) 25 MG tablet Take 1 tablet (25 mg total) by mouth once. 2 hours before CT scan on 09/18/2015 10 tablet 0  . FeFum-FePoly-FA-B Cmp-C-Biot (INTEGRA PLUS) CAPS Take 1 capsule by mouth every morning. 90 capsule 1  . fluticasone (FLONASE) 50 MCG/ACT nasal spray Place 1-2 sprays into both nostrils daily as needed. 16 g 5  . folic acid (FOLVITE) 1 MG tablet TAKE 1 TABLET BY MOUTH EVERY DAY 90 tablet 1  . furosemide (LASIX) 20 MG tablet TAKE 1  TABLET (20 MG TOTAL) BY MOUTH DAILY. AS NEEDED FOR SWELLING. 20 tablet 0  . lacosamide (VIMPAT) 50 MG TABS tablet Take by mouth.    . lidocaine-prilocaine (EMLA) cream Apply to Port-A-Cath as directed 1-2 hours prior to chemotherapy 30 g 1  . lisinopril (PRINIVIL,ZESTRIL) 20 MG tablet TAKE 2 TABLETS (40 MG TOTAL) BY MOUTH DAILY. 180 tablet 1  . morphine (MS CONTIN) 60 MG  12 hr tablet Take 1 tablet (60 mg total) by mouth 2 (two) times daily. 60 tablet 0  . morphine (MSIR) 30 MG tablet Take 1 tablet (30 mg total) by mouth every 4 (four) hours as needed (breakthrough pain). 60 tablet 0  . Olopatadine HCl 0.2 % SOLN Apply 1 drop to eye daily. 2.5 mL 11  . ondansetron (ZOFRAN) 8 MG tablet Take 1 tablet (8 mg total) by mouth every 8 (eight) hours as needed for nausea or vomiting. 30 tablet 0  . predniSONE (DELTASONE) 50 MG tablet 1 tablet by mouth 13 hours ,7 hours and 1 hour  before CT scan 3 tablet 3  . prochlorperazine (COMPAZINE) 10 MG tablet Take 10 mg by mouth.    . triamcinolone (KENALOG) 0.025 % ointment Apply 1 application topically 2 (two) times daily. 30 g 0   No current facility-administered medications for this visit.     SURGICAL HISTORY:  Past Surgical History:  Procedure Laterality Date  . CERVICAL LAMINECTOMY  1995  . KNEE SURGERY  1990   Left x 2  . 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 LOOP RECORDER IMPLANT N/A 04/19/2014   Procedure: LOOP RECORDER IMPLANT;  Surgeon: Thompson Grayer, MD;  Location: [facility] CATH LAB;  Service: Cardiovascular;  Laterality: N/A;  . OTHER SURGICAL HISTORY  2008   Gamma knife surgery to remove brain met   . PORTACATH PLACEMENT  -[name]   TIP IN CAVOATRIAL JUNCTION  . TEE WITHOUT CARDIOVERSION N/A 04/19/2014   Procedure: TRANSESOPHAGEAL ECHOCARDIOGRAM (TEE);  Surgeon: Larey Dresser, MD;  Location: [facility] ENDOSCOPY;  Service: Cardiovascular;  Laterality: N/A;    REVIEW OF SYSTEMS:  A comprehensive review of systems was negative.   PHYSICAL EXAMINATION: General appearance: alert, cooperative and no distress Head: Normocephalic, without obvious abnormality, atraumatic Neck: no adenopathy, no JVD, supple, symmetrical, trachea midline and thyroid not enlarged, symmetric, no tenderness/mass/nodules Lymph nodes: Cervical, supraclavicular, and axillary nodes normal. Resp: clear to auscultation  bilaterally Back: symmetric, no curvature. ROM normal. No CVA tenderness. Cardio: regular rate and rhythm, S1, S2 normal, no murmur, click, rub or gallop GI: soft, non-tender; bowel sounds normal; no masses,  no organomegaly Extremities: extremities normal, atraumatic, no cyanosis or edema  ECOG PERFORMANCE STATUS: 0 - Asymptomatic  Blood pressure (!) 128/54, pulse 77, temperature 97.8 F (36.6 C), temperature source Oral, resp. rate 18, height _0  (1.6 m), weight 116 lb (52.6 kg), SpO2 93 %.  LABORATORY DATA: Lab Results  Component Value Date   WBC 9.8 04/08/2017   HGB 11.0 (L) 04/08/2017   HCT 33.4 (L) 04/08/2017   MCV 95.2 04/08/2017   PLT 406 (H) 04/08/2017      Chemistry      Component Value Date/Time   NA 139 03/18/2017 0859   NA 142 12/24/2016 0816   K 4.0 03/18/2017 0859   K 4.4 12/24/2016 0816   CL 106 03/18/2017 0859   CL 106 06/30/2012 1004   CO2 26 03/18/2017 0859   CO2 28 12/24/2016 0816  BUN 8 03/18/2017 0859   BUN 15.8 12/24/2016 0816   CREATININE 0.79 03/18/2017 0859   CREATININE 1.1 12/24/2016 0816      Component Value Date/Time   CALCIUM 9.7 03/18/2017 0859   CALCIUM 9.6 12/24/2016 0816   ALKPHOS 85 03/18/2017 0859   ALKPHOS 80 12/24/2016 0816   AST 20 03/18/2017 0859   AST 20 12/24/2016 0816   ALT 12 03/18/2017 0859   ALT 11 12/24/2016 0816   BILITOT 0.4 03/18/2017 0859   BILITOT 0.34 12/24/2016 0816       RADIOGRAPHIC STUDIES: No results found.  ASSESSMENT AND PLAN: This is a very pleasant 67 years old white female with metastatic non-small cell lung cancer diagnosed in October 2007 status post concurrent chemoradiation followed by consolidation chemotherapy and she is currently on maintenance treatment with single agent Alimta status post 166 cycles. The patient tolerated the last cycle of her treatment well. I recommended for her to proceed with cycle 167 today as a scheduled. She will have repeat CT scan of the chest after the next  cycle of her treatment. I gave the patient refill for Zofran today. She was advised to call immediately if she has any concerning symptoms in the interval. The patient voices understanding of current disease status and treatment options and is in agreement with the current care plan. All questions were answered. The patient knows to call the clinic with any problems, questions or concerns. We can certainly see the patient much sooner if necessary.  Disclaimer: This note was dictated with voice recognition software. Similar sounding words can inadvertently be transcribed and may not be corrected upon review.

## 2017-04-08 NOTE — Patient Instructions (Signed)
[place] Discharge Instructions for Patients Receiving Chemotherapy  Today you received the following chemotherapy agents Alimta  To help prevent nausea and vomiting after your treatment, we encourage you to take your nausea medication as directed   If you develop nausea and vomiting that is not controlled by your nausea medication, call the clinic.   BELOW ARE SYMPTOMS THAT SHOULD BE REPORTED IMMEDIATELY:  *FEVER GREATER THAN 100.5 F  *CHILLS WITH OR WITHOUT FEVER  NAUSEA AND VOMITING THAT IS NOT CONTROLLED WITH YOUR NAUSEA MEDICATION  *UNUSUAL SHORTNESS OF BREATH  *UNUSUAL BRUISING OR BLEEDING  TENDERNESS IN MOUTH AND THROAT WITH OR WITHOUT PRESENCE OF ULCERS  *URINARY PROBLEMS  *BOWEL PROBLEMS  UNUSUAL RASH Items with * indicate a potential emergency and should be followed up as soon as possible.  Feel free to call the clinic should you have any questions or concerns. The clinic phone number is [phone].  Please show the [place] at check-in to the Emergency Department and triage nurse.

## 2017-04-08 NOTE — Telephone Encounter (Signed)
Scheduled appt per 4/2 los - patient to get an updated schedule next visit

## 2017-04-09 ENCOUNTER — Other Ambulatory Visit: Payer: Self-pay | Admitting: Medical Oncology

## 2017-04-09 DIAGNOSIS — D649 Anemia, unspecified: Secondary | ICD-10-CM

## 2017-04-09 MED ORDER — INTEGRA PLUS PO CAPS: 1.0000 | ORAL_CAPSULE | Freq: Every morning | ORAL | 2 refills | Status: DC

## 2017-04-15 ENCOUNTER — Other Ambulatory Visit: Payer: Self-pay

## 2017-04-15 DIAGNOSIS — C3410 Malignant neoplasm of upper lobe, unspecified bronchus or lung: Secondary | ICD-10-CM

## 2017-04-15 DIAGNOSIS — C3490 Malignant neoplasm of unspecified part of unspecified bronchus or lung: Secondary | ICD-10-CM

## 2017-04-15 MED ORDER — MORPHINE SULFATE 30 MG PO TABS: 30.0000 mg | ORAL_TABLET | ORAL | 0 refills | Status: DC | PRN

## 2017-04-15 MED ORDER — MORPHINE SULFATE ER 60 MG PO TBCR: 60.0000 mg | EXTENDED_RELEASE_TABLET | Freq: Two times a day (BID) | ORAL | 0 refills | Status: DC

## 2017-04-20 IMAGING — CT CT CHEST W/ CM
2 of 5 series · 16 of 46 positions shown, 18 images · IV contrast (OMNIPAQUE)
Comparison: CT CP 12/24/2013

CLINICAL DATA: Patient with history of non-small cell lung
carcinoma. Restaging exam.

EXAM:
CT CHEST, ABDOMEN, AND PELVIS WITH CONTRAST
TECHNIQUE: Multidetector CT imaging of the chest, abdomen and pelvis was
performed following the standard protocol during bolus
administration of intravenous contrast.
CONTRAST:  100mL OMNIPAQUE IOHEXOL 300 MG/ML  SOLN

[Series 2: cap with st · axial · 0.73mm/px · z∈[-626,-46]mm · 13 of 132 slices shown, 15 images]
[im 8/132  soft-tissue]
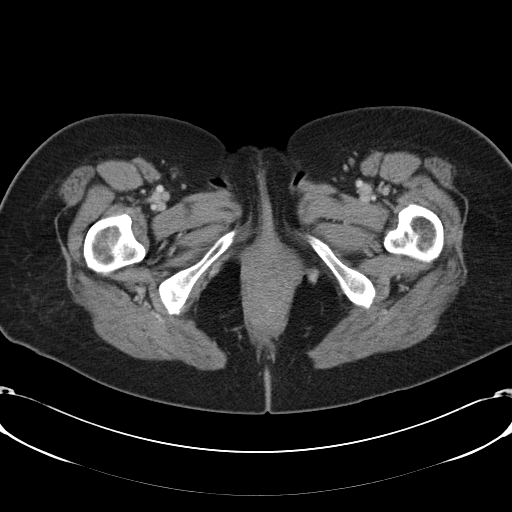
[im 8/132  bone]
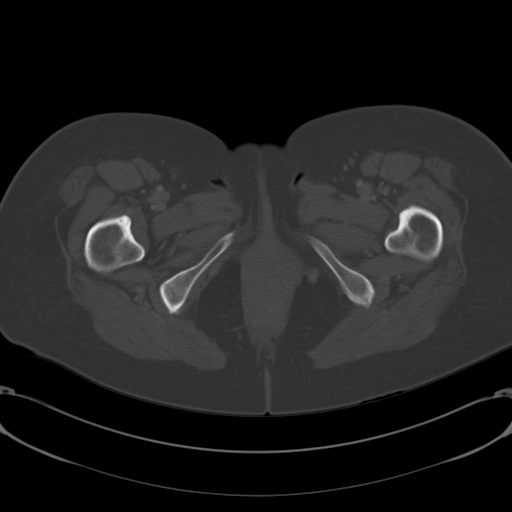
[im 16/132  soft-tissue]
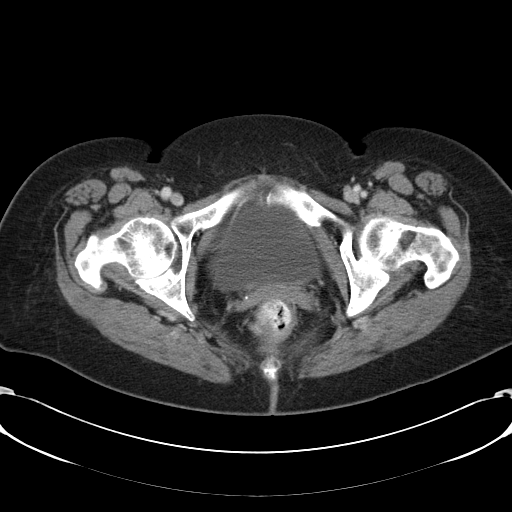
[im 31/132  soft-tissue]
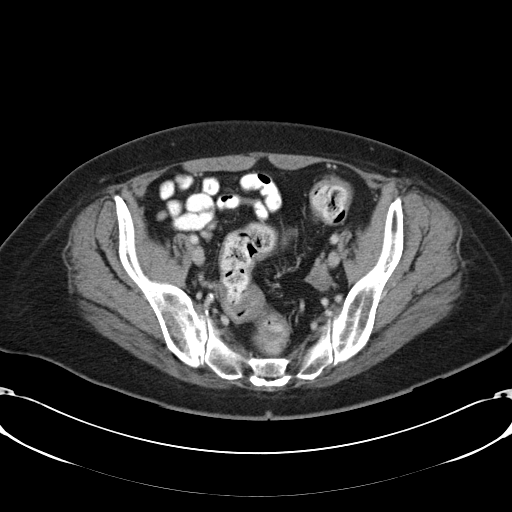
[im 39/132  soft-tissue]
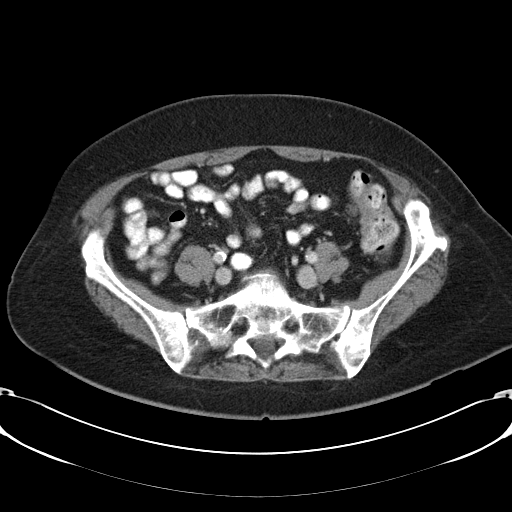
[im 47/132  soft-tissue]
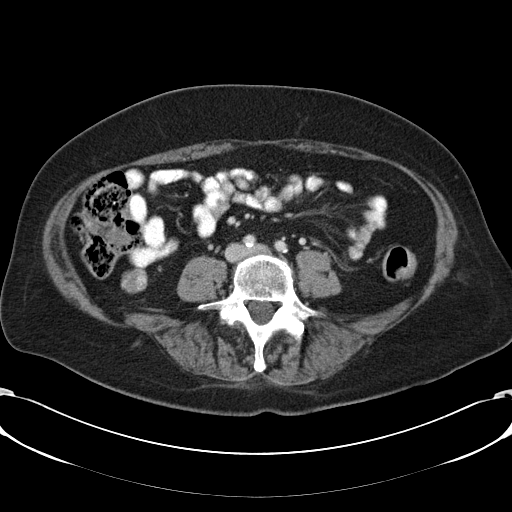
[im 54/132  soft-tissue]
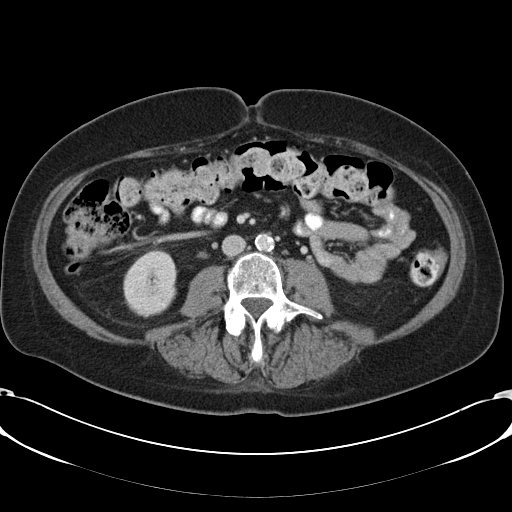
[im 70/132  soft-tissue]
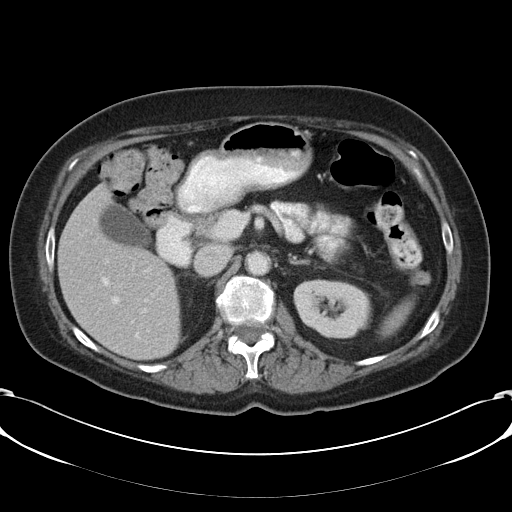
[im 78/132  soft-tissue]
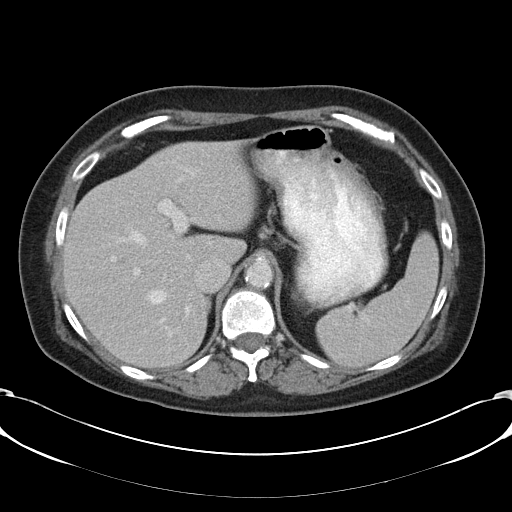
[im 85/132  soft-tissue]
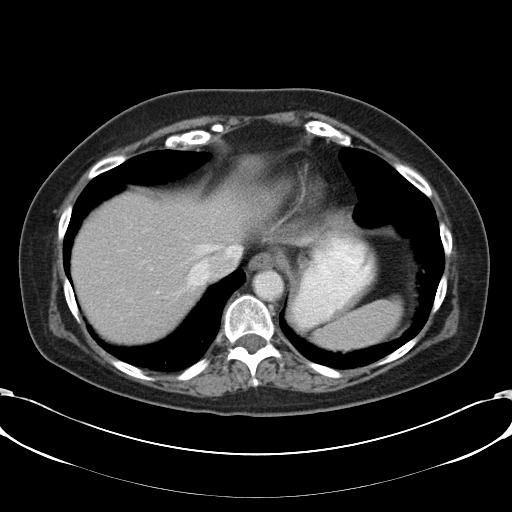
[im 85/132  bone]
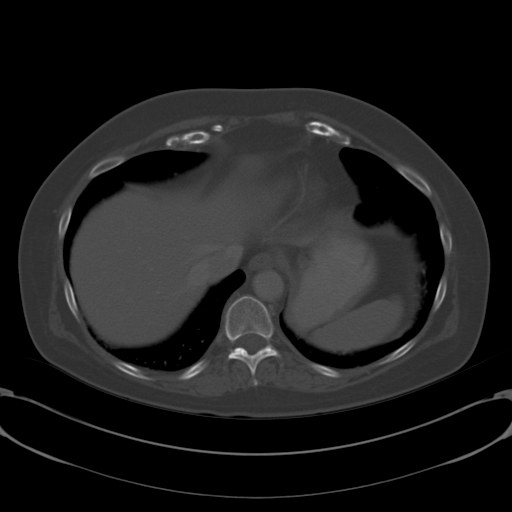
[im 93/132  soft-tissue]
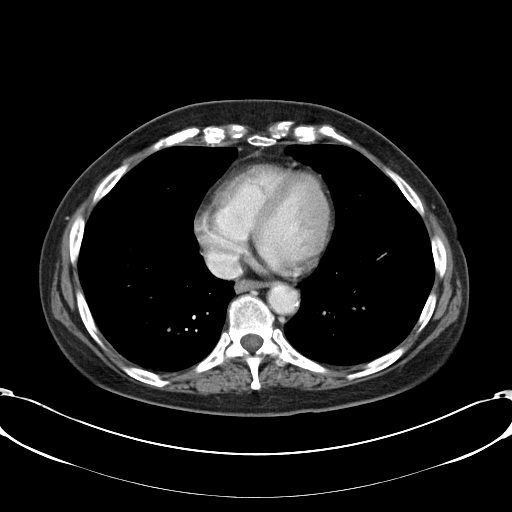
[im 101/132  soft-tissue]
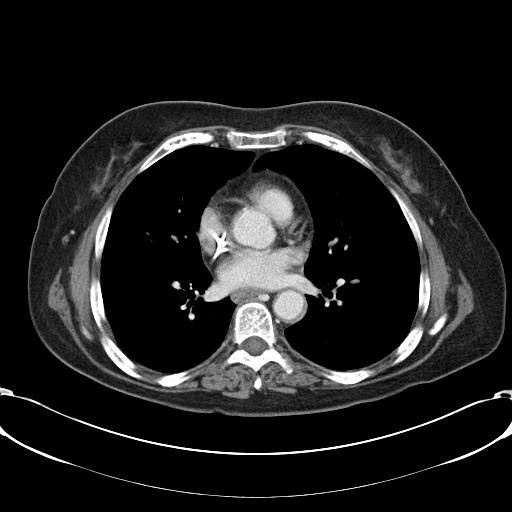
[im 116/132  soft-tissue]
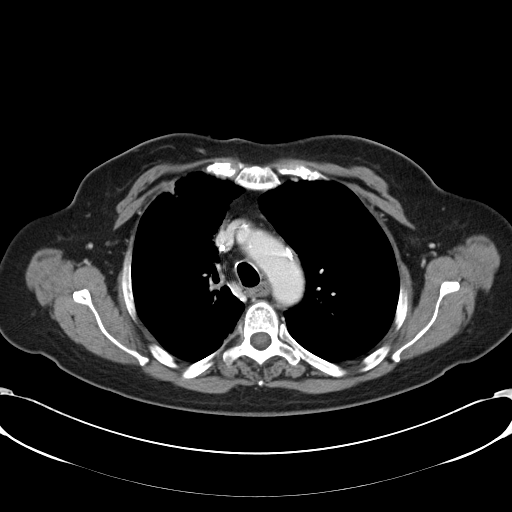
[im 124/132  soft-tissue]
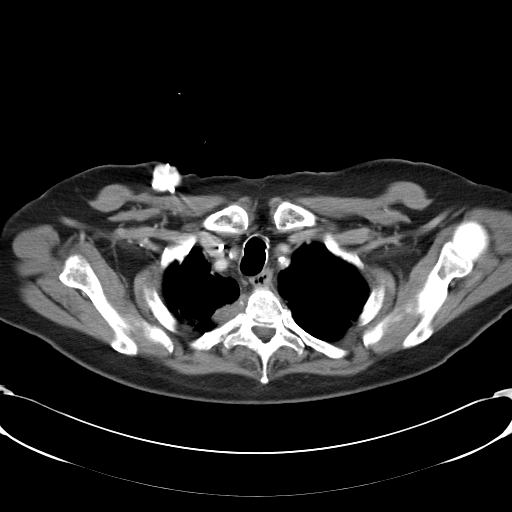

[Series 602: <mpr thick range> · coronal · 1.29mm/px · 3 of 86 slices shown]
[im 29/86  soft-tissue]
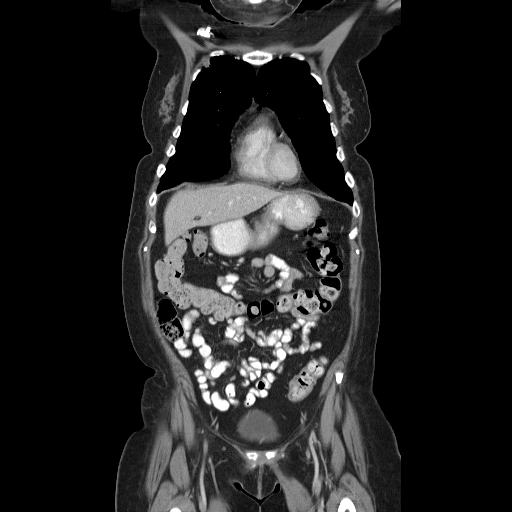
[im 38/86  soft-tissue]
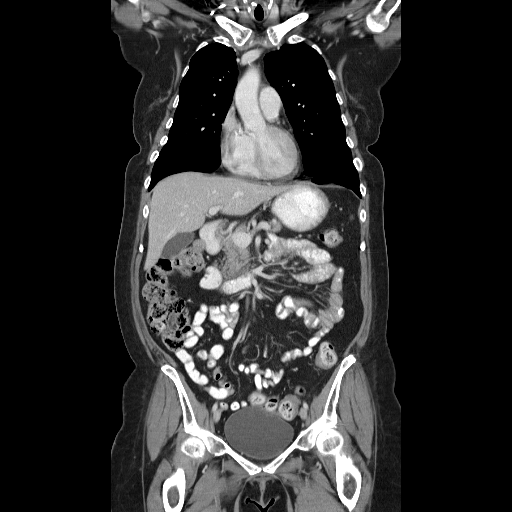
[im 48/86  soft-tissue]
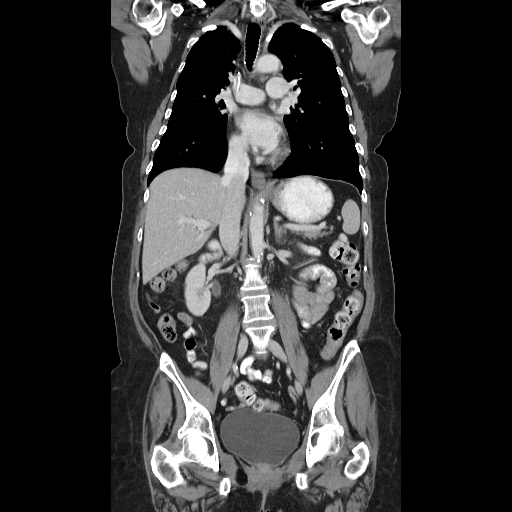

[16 of 46 positions shown; findings below may reference images not displayed]

FINDINGS: CT CHEST FINDINGS

Mediastinum/Nodes: Right anterior chest wall Port-A-Cath is present
with tip terminating in the right atrium. No enlarged axillary,
mediastinal or hilar lymphadenopathy. The heart is normal in size.
No pericardial effusion. Coronary arterial vascular calcifications.
Aorta and main pulmonary artery are normal in caliber.

Lungs/Pleura: The central airways are patent. Centrilobular
emphysematous changes. Unchanged subpleural 9 mm opacity within the
right upper lobe (image 16; series 5). No pleural effusion or
pneumothorax. Minimal dependent atelectasis left lung base.

Musculoskeletal: No aggressive or acute appearing osseous lesions.
Multiple thoracic vertebral bodies with kyphoplasty material.

CT ABDOMEN AND PELVIS FINDINGS

Hepatobiliary: Liver is normal in size and contour without focal
hepatic lesion identified. No intrahepatic or extrahepatic biliary
ductal dilatation. The gallbladder is unremarkable.

Pancreas: Unremarkable

Spleen: Unremarkable

Adrenals/Urinary Tract: Normal adrenal gland. Kidneys enhance
symmetrically with contrast. No hydronephrosis. The urinary bladder
is unremarkable.

Stomach/Bowel: Oral contrast material is present to the level of the
rectum. The appendix is normal. No abnormal bowel wall thickening or
evidence for bowel obstruction. No free fluid or free
intraperitoneal air.

Vascular/Lymphatic: Normal caliber abdominal aorta. No
retroperitoneal lymphadenopathy.

Other: The uterus and bilateral adnexal structures are unremarkable.

Musculoskeletal: Lower lumbar spine degenerative changes. No
aggressive or acute appearing osseous lesions. Unchanged compression
deformity of the T3 vertebral body.
IMPRESSION: Stable exam without evidence for locally recurrent or metastatic
disease within the chest, abdomen or pelvis.

## 2017-04-21 NOTE — Progress Notes (Signed)
PA for Morphine Sulf 30 mg tabs sent to plan. Status is pending.

## 2017-04-28 NOTE — Progress Notes (Signed)
Prior auth for Morphine sulfate was submitted on 04/18/17. Awaiting fax from insurance for determination.

## 2017-04-29 ENCOUNTER — Telehealth: Payer: Self-pay | Admitting: Internal Medicine

## 2017-04-29 ENCOUNTER — Telehealth: Payer: Self-pay | Admitting: Medical Oncology

## 2017-04-29 ENCOUNTER — Inpatient Hospital Stay: Payer: Medicare Other

## 2017-04-29 ENCOUNTER — Inpatient Hospital Stay (HOSPITAL_BASED_OUTPATIENT_CLINIC_OR_DEPARTMENT_OTHER): Payer: Medicare Other | Admitting: Internal Medicine

## 2017-04-29 ENCOUNTER — Encounter: Payer: Self-pay | Admitting: Internal Medicine

## 2017-04-29 DIAGNOSIS — Z95828 Presence of other vascular implants and grafts: Secondary | ICD-10-CM

## 2017-04-29 DIAGNOSIS — C3411 Malignant neoplasm of upper lobe, right bronchus or lung: Secondary | ICD-10-CM

## 2017-04-29 DIAGNOSIS — C3490 Malignant neoplasm of unspecified part of unspecified bronchus or lung: Secondary | ICD-10-CM

## 2017-04-29 DIAGNOSIS — Z5111 Encounter for antineoplastic chemotherapy: Secondary | ICD-10-CM | POA: Diagnosis not present

## 2017-04-29 DIAGNOSIS — C7931 Secondary malignant neoplasm of brain: Secondary | ICD-10-CM | POA: Diagnosis not present

## 2017-04-29 LAB — COMPREHENSIVE METABOLIC PANEL
ALT: 14 U/L (ref 0–55)
AST: 23 U/L (ref 5–34)
Albumin: 3.2 g/dL — ABNORMAL LOW (ref 3.5–5.0)
Alkaline Phosphatase: 81 U/L (ref 40–150)
Anion gap: 9 (ref 3–11)
BUN: 20 mg/dL (ref 7–26)
CHLORIDE: 104 mmol/L (ref 98–109)
CO2: 28 mmol/L (ref 22–29)
CREATININE: 0.92 mg/dL (ref 0.60–1.10)
Calcium: 9.5 mg/dL (ref 8.4–10.4)
GFR calc non Af Amer: >60 mL/min (ref >60)
Glucose, Bld: 152 mg/dL — ABNORMAL HIGH (ref 70–140)
POTASSIUM: 4 mmol/L (ref 3.5–5.1)
SODIUM: 141 mmol/L (ref 136–145)
Total Bilirubin: 0.3 mg/dL (ref 0.2–1.2)
Total Protein: 7 g/dL (ref 6.4–8.3)

## 2017-04-29 LAB — CBC WITH DIFFERENTIAL/PLATELET
Basophils Absolute: 0 10*3/uL (ref 0.0–0.1)
Basophils Relative: 0 %
EOS ABS: 0.1 10*3/uL (ref 0.0–0.5)
Eosinophils Relative: 1 %
HEMATOCRIT: 35.9 % (ref 34.8–46.6)
HEMOGLOBIN: 11.2 g/dL — AB (ref 11.6–15.9)
LYMPHS ABS: 1.7 10*3/uL (ref 0.9–3.3)
LYMPHS PCT: 17 %
MCH: 30.6 pg (ref 25.1–34.0)
MCHC: 31.2 g/dL — ABNORMAL LOW (ref 31.5–36.0)
MCV: 98.1 fL (ref 79.5–101.0)
MONOS PCT: 9 %
Monocytes Absolute: 0.9 10*3/uL (ref 0.1–0.9)
NEUTROS ABS: 7.3 10*3/uL — AB (ref 1.5–6.5)
NEUTROS PCT: 73 %
Platelets: 357 10*3/uL (ref 145–400)
RBC: 3.66 MIL/uL — ABNORMAL LOW (ref 3.70–5.45)
RDW: 14.8 % — ABNORMAL HIGH (ref 11.2–14.5)
WBC: 10.1 10*3/uL (ref 3.9–10.3)

## 2017-04-29 MED ORDER — HEPARIN SOD (PORK) LOCK FLUSH 100 UNIT/ML IV SOLN
500.0000 [IU] | Freq: Once | INTRAVENOUS | Status: AC | PRN
  Administered 2017-04-29: 500 [IU]
  Filled 2017-04-29: qty 5

## 2017-04-29 MED ORDER — DEXAMETHASONE SODIUM PHOSPHATE 10 MG/ML IJ SOLN
INTRAMUSCULAR | Status: AC
  Filled 2017-04-29: qty 1

## 2017-04-29 MED ORDER — DEXAMETHASONE SODIUM PHOSPHATE 10 MG/ML IJ SOLN
10.0000 mg | Freq: Once | INTRAMUSCULAR | Status: AC
  Administered 2017-04-29: 10 mg via INTRAVENOUS

## 2017-04-29 MED ORDER — SODIUM CHLORIDE 0.9% FLUSH
10.0000 mL | Freq: Once | INTRAVENOUS | Status: AC
  Administered 2017-04-29: 10 mL
  Filled 2017-04-29: qty 10

## 2017-04-29 MED ORDER — CYANOCOBALAMIN 1000 MCG/ML IJ SOLN
INTRAMUSCULAR | Status: AC
  Filled 2017-04-29: qty 1

## 2017-04-29 MED ORDER — SODIUM CHLORIDE 0.9 % IJ SOLN
10.0000 mL | INTRAMUSCULAR | Status: DC | PRN
  Administered 2017-04-29: 10 mL
  Filled 2017-04-29: qty 10

## 2017-04-29 MED ORDER — ONDANSETRON HCL 8 MG PO TABS: 8.0000 mg | ORAL_TABLET | Freq: Once | ORAL | Status: DC

## 2017-04-29 MED ORDER — SODIUM CHLORIDE 0.9 % IV SOLN
Freq: Once | INTRAVENOUS | Status: AC
  Administered 2017-04-29: 11:00:00 via INTRAVENOUS

## 2017-04-29 MED ORDER — SODIUM CHLORIDE 0.9 % IV SOLN
500.0000 mg/m2 | Freq: Once | INTRAVENOUS | Status: AC
  Administered 2017-04-29: 800 mg via INTRAVENOUS
  Filled 2017-04-29: qty 20

## 2017-04-29 MED ORDER — CYANOCOBALAMIN 1000 MCG/ML IJ SOLN
1000.0000 ug | Freq: Once | INTRAMUSCULAR | Status: AC
  Administered 2017-04-29: 1000 ug via INTRAMUSCULAR

## 2017-04-29 NOTE — Patient Instructions (Signed)
[facility] Discharge Instructions for Patients Receiving Chemotherapy  Today you received the following chemotherapy agents:  Alimta  To help prevent nausea and vomiting after your treatment, we encourage you to take your nausea medication as prescribed.   If you develop nausea and vomiting that is not controlled by your nausea medication, call the clinic.   BELOW ARE SYMPTOMS THAT SHOULD BE REPORTED IMMEDIATELY:  *FEVER GREATER THAN 100.5 F  *CHILLS WITH OR WITHOUT FEVER  NAUSEA AND VOMITING THAT IS NOT CONTROLLED WITH YOUR NAUSEA MEDICATION  *UNUSUAL SHORTNESS OF BREATH  *UNUSUAL BRUISING OR BLEEDING  TENDERNESS IN MOUTH AND THROAT WITH OR WITHOUT PRESENCE OF ULCERS  *URINARY PROBLEMS  *BOWEL PROBLEMS  UNUSUAL RASH Items with * indicate a potential emergency and should be followed up as soon as possible.  Feel free to call the clinic should you have any questions or concerns. The clinic phone number is [phone].  Please show the [facility] at check-in to the Emergency Department and triage nurse.

## 2017-04-29 NOTE — Patient Instructions (Signed)

## 2017-04-29 NOTE — Telephone Encounter (Signed)
Appts already scheduled per 4/23 los - [facility] radiology to contact patient with ct scan .

## 2017-04-29 NOTE — Progress Notes (Signed)
[place] [phone]   [phone]  OFFICE PROGRESS NOTE  [name], MD [place] [address]  DIAGNOSIS: Metastatic non-small cell lung cancer initially diagnosed as locally advanced stage IIIB with a right Pancoast tumor involving the vertebral body as well as prominent canal invasion with spinal cord compression in October 2007. The patient also has metastatic disease to the brain in April 2008.  PRIOR THERAPY: 1. Status post concurrent chemoradiation with weekly carboplatin and paclitaxel, last dose was given November 18, 2005. 2. 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 3. Status post gamma knife radiotherapy to a solitary brain lesion located in the superior frontal area of the brain at [facility] in April of 2008. 4. Status post palliative radiotherapy to the lateral abdominal wall metastatic lesion under the care of Dr. Lisbeth Renshaw, completed March of 2009. 5. Status post 6 cycles of systemic chemotherapy with carboplatin and Alimta. Last dose was given July 26, 2007 with disease stabilization. 6. Gamma knife stereotactic radiotherapy to 2 brain lesions one involving the right frontal dural based as well as right parietal lesion performed on 05/07/2012 under the care of Dr. Vallarie Mare at [facility].  CURRENT THERAPY: Maintenance systemic chemotherapy with Alimta 500 MG/M2 every 3 weeks, status post 167 cycles.  INTERVAL HISTORY: Danielle [name] 67 y.o. female returns to the clinic today for follow-up visit accompanied by her daughter.  The patient continues to tolerate the treatment fairly well with no concerning complaints except for mild nausea.  She is requesting refill of Zofran.  She denied having any chest pain, shortness breath, cough or hemoptysis.  She denied having any fever or chills.  She has no weight loss or night sweats.   She is here today for evaluation before starting cycle 168.  MEDICAL HISTORY: Past Medical History:  Diagnosis Date  . Anemia   . 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 CVA (cerebral vascular accident) ([place]) 04/2014   pt states had 2 cva's within 2 wks  . DJD (degenerative joint disease), cervical   . Fibromyalgia   . H/O: pneumonia   . History of tobacco abuse quit 10/08   on nicotine patch  . Hypertension   . Hypokalemia   . Lung cancer ([place]) dx'd 09/2005   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 had chemo and radiation for lung ca  . Thrush 12/11/2010    ALLERGIES:  is allergic to contrast media [iodinated diagnostic agents]; iohexol; sulfa drugs cross reactors; and co-trimoxazole injection [sulfamethoxazole-trimethoprim].  MEDICATIONS:  Current Outpatient Medications  Medication Sig Dispense Refill  . amLODipine (NORVASC) 10 MG tablet TAKE 1 TABLET (10 MG TOTAL) BY MOUTH DAILY. 90 tablet 1  . aspirin 325 MG EC tablet TAKE 1 TABLET BY MOUTH EVERY DAY 90 tablet 0  . atorvastatin (LIPITOR) 40 MG tablet TAKE 1 TABLET BY MOUTH EVERY DAY AT 6 PM 90 tablet 3  . FeFum-FePoly-FA-B Cmp-C-Biot (INTEGRA PLUS) CAPS Take 1 capsule by mouth every morning. 90 capsule 2  . fluticasone (FLONASE) 50 MCG/ACT nasal spray Place 1-2 sprays into both nostrils daily as needed. 16 g 5  . folic acid (FOLVITE) 1 MG tablet TAKE 1 TABLET BY MOUTH EVERY DAY 90 tablet 1  . furosemide (LASIX) 20 MG tablet TAKE 1 TABLET (20 MG TOTAL) BY MOUTH DAILY. AS NEEDED FOR SWELLING. 20 tablet 0  . lacosamide (VIMPAT) 50  MG TABS tablet Take by mouth.    . lidocaine-prilocaine (EMLA) cream Apply to Port-A-Cath as directed 1-2 hours prior to chemotherapy 30 g 1  . lisinopril (PRINIVIL,ZESTRIL) 20 MG tablet TAKE 2 TABLETS (40 MG TOTAL) BY MOUTH DAILY. 180 tablet 1  . morphine (MS CONTIN) 60 MG 12 hr tablet Take 1 tablet (60 mg total) by mouth 2 (two) times daily. 60 tablet 0  . morphine (MSIR) 30 MG  tablet Take 1 tablet (30 mg total) by mouth every 4 (four) hours as needed (breakthrough pain). 60 tablet 0  . Olopatadine HCl 0.2 % SOLN Apply 1 drop to eye daily. 2.5 mL 11  . ondansetron (ZOFRAN) 8 MG tablet Take 1 tablet (8 mg total) by mouth every 8 (eight) hours as needed for nausea or vomiting. 30 tablet 0  . triamcinolone (KENALOG) 0.025 % ointment Apply 1 application topically 2 (two) times daily. 30 g 0  . diphenhydrAMINE (BENADRYL) 25 MG tablet Take 1 tablet (25 mg total) by mouth once. 2 hours before CT scan on 09/18/2015 10 tablet 0  . predniSONE (DELTASONE) 50 MG tablet 1 tablet by mouth 13 hours ,7 hours and 1 hour  before CT scan (Patient not taking: Reported on 04/08/2017) 3 tablet 3  . prochlorperazine (COMPAZINE) 10 MG tablet Take 10 mg by mouth.     No current facility-administered medications for this visit.     SURGICAL HISTORY:  Past Surgical History:  Procedure Laterality Date  . CERVICAL LAMINECTOMY  1995  . KNEE SURGERY  1990   Left x 2  . 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 LOOP RECORDER IMPLANT N/A 04/19/2014   Procedure: LOOP RECORDER IMPLANT;  Surgeon: Thompson Grayer, MD;  Location: [facility] CATH LAB;  Service: Cardiovascular;  Laterality: N/A;  . OTHER SURGICAL HISTORY  2008   Gamma knife surgery to remove brain met   . PORTACATH PLACEMENT  -[name]   TIP IN CAVOATRIAL JUNCTION  . TEE WITHOUT CARDIOVERSION N/A 04/19/2014   Procedure: TRANSESOPHAGEAL ECHOCARDIOGRAM (TEE);  Surgeon: Larey Dresser, MD;  Location: [facility];  Service: Cardiovascular;  Laterality: N/A;    REVIEW OF SYSTEMS:  A comprehensive review of systems was negative except for: Gastrointestinal: positive for nausea   PHYSICAL EXAMINATION: General appearance: alert, cooperative and no distress Head: Normocephalic, without obvious abnormality, atraumatic Neck: no adenopathy, no JVD, supple, symmetrical, trachea midline and thyroid not enlarged, symmetric, no  tenderness/mass/nodules Lymph nodes: Cervical, supraclavicular, and axillary nodes normal. Resp: clear to auscultation bilaterally Back: symmetric, no curvature. ROM normal. No CVA tenderness. Cardio: regular rate and rhythm, S1, S2 normal, no murmur, click, rub or gallop GI: soft, non-tender; bowel sounds normal; no masses,  no organomegaly Extremities: extremities normal, atraumatic, no cyanosis or edema  ECOG PERFORMANCE STATUS: 0 - Asymptomatic  Blood pressure 139/66, pulse 76, temperature 97.6 F (36.4 C), temperature source Oral, resp. rate 18, height '5\' 3"'  (1.6 m), weight 117 lb 9.6 oz (53.3 kg), SpO2 97 %.  LABORATORY DATA: Lab Results  Component Value Date   WBC 10.1 04/29/2017   HGB 11.2 (L) 04/29/2017   HCT 35.9 04/29/2017   MCV 98.1 04/29/2017   PLT 357 04/29/2017      Chemistry      Component Value Date/Time   NA 139 04/08/2017 0900   NA 142 12/24/2016 0816   K 4.0 04/08/2017 0900   K 4.4 12/24/2016 0816   CL 106 04/08/2017 0900  CL 106 06/30/2012 1004   CO2 26 04/08/2017 0900   CO2 28 12/24/2016 0816   BUN 10 04/08/2017 0900   BUN 15.8 12/24/2016 0816   CREATININE 0.80 04/08/2017 0900   CREATININE 1.1 12/24/2016 0816      Component Value Date/Time   CALCIUM 9.4 04/08/2017 0900   CALCIUM 9.6 12/24/2016 0816   ALKPHOS 73 04/08/2017 0900   ALKPHOS 80 12/24/2016 0816   AST 18 04/08/2017 0900   AST 20 12/24/2016 0816   ALT 11 04/08/2017 0900   ALT 11 12/24/2016 0816   BILITOT 0.3 04/08/2017 0900   BILITOT 0.34 12/24/2016 0816       RADIOGRAPHIC STUDIES: No results found.  ASSESSMENT AND PLAN: This is a very pleasant 67 years old white female with metastatic non-small cell lung cancer diagnosed in October 2007 status post concurrent chemoradiation followed by consolidation chemotherapy and she is currently on maintenance treatment with single agent Alimta status post 167 cycles. The patient tolerated the last cycle of her treatment well. I  recommended for the patient to proceed with cycle 168 today as a scheduled. I will see the patient back for follow-up visit in 3 weeks for evaluation before starting the next cycle of her treatment after repeating CT scan of the chest, abdomen and pelvis for restaging of her disease. The patient was advised to call immediately if she has any concerning symptoms in the interval. The patient voices understanding of current disease status and treatment options and is in agreement with the current care plan. All questions were answered. The patient knows to call the clinic with any problems, questions or concerns. We can certainly see the patient much sooner if necessary.  Disclaimer: This note was dictated with voice recognition software. Similar sounding words can inadvertently be transcribed and may not be corrected upon review.

## 2017-04-29 NOTE — Telephone Encounter (Signed)
PA # [id number]. Obtained for MSIR.

## 2017-04-30 ENCOUNTER — Other Ambulatory Visit: Payer: Self-pay | Admitting: Internal Medicine

## 2017-04-30 DIAGNOSIS — C7931 Secondary malignant neoplasm of brain: Secondary | ICD-10-CM

## 2017-05-15 ENCOUNTER — Other Ambulatory Visit: Payer: Self-pay | Admitting: Medical Oncology

## 2017-05-15 DIAGNOSIS — C3490 Malignant neoplasm of unspecified part of unspecified bronchus or lung: Secondary | ICD-10-CM

## 2017-05-15 DIAGNOSIS — C3410 Malignant neoplasm of upper lobe, unspecified bronchus or lung: Secondary | ICD-10-CM

## 2017-05-15 MED ORDER — MORPHINE SULFATE 30 MG PO TABS: 30.0000 mg | ORAL_TABLET | ORAL | 0 refills | Status: DC | PRN

## 2017-05-15 MED ORDER — MORPHINE SULFATE ER 60 MG PO TBCR: 60.0000 mg | EXTENDED_RELEASE_TABLET | Freq: Two times a day (BID) | ORAL | 0 refills | Status: DC

## 2017-05-16 ENCOUNTER — Telehealth: Payer: Self-pay

## 2017-05-16 NOTE — Telephone Encounter (Signed)
Request from [place], [place] to remind pt about 13 hour pre-medication regimen prior to her CT scan on 5/13. Pt has used this protocol before d/t her contrast allergy. Reinforced 50mg  prednisone to be taken 13 hours, 7 hours, and 1 hour prior to scan, as well as 50mg  benadryl 1 hour prior to scan. Pt verbalized understanding. Education for protocol received from [employer] staff member.

## 2017-05-19 ENCOUNTER — Ambulatory Visit (HOSPITAL_COMMUNITY)
Admission: RE | Admit: 2017-05-19 | Discharge: 2017-05-19 | Disposition: A | Discharge status: Home / Self Care | Payer: Medicare Other | Source: Ambulatory Visit | Attending: Internal Medicine | Admitting: Internal Medicine

## 2017-05-19 ENCOUNTER — Encounter (HOSPITAL_COMMUNITY): Payer: Self-pay

## 2017-05-19 DIAGNOSIS — N289 Disorder of kidney and ureter, unspecified: Secondary | ICD-10-CM | POA: Diagnosis not present

## 2017-05-19 DIAGNOSIS — I2510 Atherosclerotic heart disease of native coronary artery without angina pectoris: Secondary | ICD-10-CM | POA: Diagnosis not present

## 2017-05-19 DIAGNOSIS — J432 Centrilobular emphysema: Secondary | ICD-10-CM | POA: Diagnosis not present

## 2017-05-19 DIAGNOSIS — I700 Atherosclerosis of aorta: Secondary | ICD-10-CM | POA: Insufficient documentation

## 2017-05-19 DIAGNOSIS — C3490 Malignant neoplasm of unspecified part of unspecified bronchus or lung: Secondary | ICD-10-CM | POA: Diagnosis not present

## 2017-05-19 DIAGNOSIS — J438 Other emphysema: Secondary | ICD-10-CM | POA: Diagnosis not present

## 2017-05-19 DIAGNOSIS — Z85118 Personal history of other malignant neoplasm of bronchus and lung: Secondary | ICD-10-CM | POA: Diagnosis not present

## 2017-05-19 MED ORDER — IOHEXOL 300 MG/ML  SOLN
100.0000 mL | Freq: Once | INTRAMUSCULAR | Status: AC | PRN
  Administered 2017-05-19: 100 mL via INTRAVENOUS

## 2017-05-19 MED ORDER — HEPARIN SOD (PORK) LOCK FLUSH 100 UNIT/ML IV SOLN
500.0000 [IU] | Freq: Once | INTRAVENOUS | Status: AC
  Administered 2017-05-19: 500 [IU] via INTRAVENOUS

## 2017-05-19 MED ORDER — HEPARIN SOD (PORK) LOCK FLUSH 100 UNIT/ML IV SOLN
INTRAVENOUS | Status: AC
  Filled 2017-05-19: qty 5

## 2017-05-20 ENCOUNTER — Encounter: Payer: Self-pay | Admitting: Internal Medicine

## 2017-05-20 ENCOUNTER — Inpatient Hospital Stay: Payer: Medicare Other

## 2017-05-20 ENCOUNTER — Other Ambulatory Visit: Payer: Self-pay | Admitting: Internal Medicine

## 2017-05-20 ENCOUNTER — Inpatient Hospital Stay: Payer: Medicare Other | Attending: Internal Medicine

## 2017-05-20 ENCOUNTER — Inpatient Hospital Stay (HOSPITAL_BASED_OUTPATIENT_CLINIC_OR_DEPARTMENT_OTHER): Payer: Medicare Other | Admitting: Internal Medicine

## 2017-05-20 VITALS — BP 111/56 | HR 90 | Temp 97.7°F | Resp 17 | Ht 63.0 in | Wt 113.9 lb

## 2017-05-20 DIAGNOSIS — C7931 Secondary malignant neoplasm of brain: Secondary | ICD-10-CM | POA: Diagnosis not present

## 2017-05-20 DIAGNOSIS — Z5111 Encounter for antineoplastic chemotherapy: Secondary | ICD-10-CM | POA: Insufficient documentation

## 2017-05-20 DIAGNOSIS — C3411 Malignant neoplasm of upper lobe, right bronchus or lung: Secondary | ICD-10-CM | POA: Diagnosis not present

## 2017-05-20 DIAGNOSIS — I10 Essential (primary) hypertension: Secondary | ICD-10-CM

## 2017-05-20 DIAGNOSIS — I700 Atherosclerosis of aorta: Secondary | ICD-10-CM

## 2017-05-20 DIAGNOSIS — M7989 Other specified soft tissue disorders: Secondary | ICD-10-CM | POA: Diagnosis not present

## 2017-05-20 DIAGNOSIS — C3490 Malignant neoplasm of unspecified part of unspecified bronchus or lung: Secondary | ICD-10-CM

## 2017-05-20 DIAGNOSIS — Z95828 Presence of other vascular implants and grafts: Secondary | ICD-10-CM

## 2017-05-20 LAB — CBC WITH DIFFERENTIAL/PLATELET
BASOS PCT: 0 %
Basophils Absolute: 0 10*3/uL (ref 0.0–0.1)
EOS ABS: 0 10*3/uL (ref 0.0–0.5)
Eosinophils Relative: 0 %
HCT: 35.4 % (ref 34.8–46.6)
HEMOGLOBIN: 11.3 g/dL — AB (ref 11.6–15.9)
Lymphocytes Relative: 8 %
Lymphs Abs: 0.9 10*3/uL (ref 0.9–3.3)
MCH: 30.6 pg (ref 25.1–34.0)
MCHC: 31.9 g/dL (ref 31.5–36.0)
MCV: 95.9 fL (ref 79.5–101.0)
MONOS PCT: 11 %
Monocytes Absolute: 1.2 10*3/uL — ABNORMAL HIGH (ref 0.1–0.9)
NEUTROS PCT: 81 %
Neutro Abs: 8.9 10*3/uL — ABNORMAL HIGH (ref 1.5–6.5)
Platelets: 372 10*3/uL (ref 145–400)
RBC: 3.69 MIL/uL — ABNORMAL LOW (ref 3.70–5.45)
RDW: 15.1 % — ABNORMAL HIGH (ref 11.2–14.5)
WBC: 11.1 10*3/uL — ABNORMAL HIGH (ref 3.9–10.3)

## 2017-05-20 LAB — COMPREHENSIVE METABOLIC PANEL
ALBUMIN: 3.2 g/dL — AB (ref 3.5–5.0)
ALK PHOS: 71 U/L (ref 40–150)
ALT: 14 U/L (ref 0–55)
ANION GAP: 7 (ref 3–11)
AST: 26 U/L (ref 5–34)
BUN: 12 mg/dL (ref 7–26)
CALCIUM: 9.2 mg/dL (ref 8.4–10.4)
CHLORIDE: 101 mmol/L (ref 98–109)
CO2: 26 mmol/L (ref 22–29)
Creatinine, Ser: 0.85 mg/dL (ref 0.60–1.10)
GFR calc Af Amer: >60 mL/min (ref >60)
GFR calc non Af Amer: >60 mL/min (ref >60)
GLUCOSE: 104 mg/dL (ref 70–140)
Potassium: 3.8 mmol/L (ref 3.5–5.1)
SODIUM: 134 mmol/L — AB (ref 136–145)
Total Bilirubin: 0.3 mg/dL (ref 0.2–1.2)
Total Protein: 7 g/dL (ref 6.4–8.3)

## 2017-05-20 MED ORDER — DEXAMETHASONE SODIUM PHOSPHATE 10 MG/ML IJ SOLN
10.0000 mg | Freq: Once | INTRAMUSCULAR | Status: AC
Start: 2017-05-20 — End: 2017-05-20
  Administered 2017-05-20: 10 mg via INTRAVENOUS

## 2017-05-20 MED ORDER — SODIUM CHLORIDE 0.9% FLUSH
10.0000 mL | Freq: Once | INTRAVENOUS | Status: AC
  Administered 2017-05-20: 10 mL
  Filled 2017-05-20: qty 10

## 2017-05-20 MED ORDER — HEPARIN SOD (PORK) LOCK FLUSH 100 UNIT/ML IV SOLN
500.0000 [IU] | Freq: Once | INTRAVENOUS | Status: AC | PRN
  Administered 2017-05-20: 500 [IU]
  Filled 2017-05-20: qty 5

## 2017-05-20 MED ORDER — SODIUM CHLORIDE 0.9 % IV SOLN
Freq: Once | INTRAVENOUS | Status: AC
  Administered 2017-05-20: 09:00:00 via INTRAVENOUS

## 2017-05-20 MED ORDER — ONDANSETRON HCL 8 MG PO TABS
ORAL_TABLET | ORAL | Status: AC
  Filled 2017-05-20: qty 1

## 2017-05-20 MED ORDER — SODIUM CHLORIDE 0.9 % IV SOLN
500.0000 mg/m2 | Freq: Once | INTRAVENOUS | Status: AC
  Administered 2017-05-20: 800 mg via INTRAVENOUS
  Filled 2017-05-20: qty 20

## 2017-05-20 MED ORDER — ONDANSETRON HCL 8 MG PO TABS
8.0000 mg | ORAL_TABLET | Freq: Once | ORAL | Status: AC
  Administered 2017-05-20: 8 mg via ORAL

## 2017-05-20 MED ORDER — DEXAMETHASONE SODIUM PHOSPHATE 10 MG/ML IJ SOLN
INTRAMUSCULAR | Status: AC
  Filled 2017-05-20: qty 1

## 2017-05-20 MED ORDER — SODIUM CHLORIDE 0.9 % IJ SOLN
10.0000 mL | INTRAMUSCULAR | Status: DC | PRN
  Administered 2017-05-20: 10 mL
  Filled 2017-05-20: qty 10

## 2017-05-20 NOTE — Progress Notes (Signed)
[facility] [phone]   [phone]  OFFICE PROGRESS NOTE  Danielle Sanders, MD [place] [address]  DIAGNOSIS: Metastatic non-small cell lung cancer initially diagnosed as locally advanced stage IIIB with a right Pancoast tumor involving the vertebral body as well as prominent canal invasion with spinal cord compression in October 2007. The patient also has metastatic disease to the brain in April 2008.  PRIOR THERAPY: 1. Status post concurrent chemoradiation with weekly carboplatin and paclitaxel, last dose was given November 18, 2005. 2. 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 3. Status post gamma knife radiotherapy to a solitary brain lesion located in the superior frontal area of the brain at [facility] in April of 2008. 4. Status post palliative radiotherapy to the lateral abdominal wall metastatic lesion under the care of Dr. Lisbeth Renshaw, completed March of 2009. 5. Status post 6 cycles of systemic chemotherapy with carboplatin and Alimta. Last dose was given July 26, 2007 with disease stabilization. 6. Gamma knife stereotactic radiotherapy to 2 brain lesions one involving the right frontal dural based as well as right parietal lesion performed on 05/07/2012 under the care of Dr. Vallarie Mare at [facility].  CURRENT THERAPY: Maintenance systemic chemotherapy with Alimta 500 MG/M2 every 3 weeks, status post 168 cycles.  INTERVAL HISTORY: Takenya Travaglini [name] 67 y.o. female returns to the clinic today for follow-up visit accompanied by her daughter.  The patient is feeling fine today with no specific complaints except for chest congestion from seasonal allergy.  She did not take any over-the-counter medication until now.  She denied having any chest pain, shortness breath, cough or hemoptysis.  She denied having any fever or chills.  She has no  nausea, vomiting, diarrhea or constipation.  She has no fever or chills.  She denied having any significant weight loss or night sweats.  The patient has been tolerating her treatment with maintenance Alimta fairly well.  She is here today for evaluation before starting cycle 169.  She had a repeat CT scan of the chest, abdomen and pelvis performed recently and she is here for evaluation.  MEDICAL HISTORY: Past Medical History:  Diagnosis Date  . Anemia   . 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 CVA (cerebral vascular accident) ([place]) 04/2014   pt states had 2 cva's within 2 wks  . DJD (degenerative joint disease), cervical   . Fibromyalgia   . H/O: pneumonia   . History of tobacco abuse quit 10/08   on nicotine patch  . Hypertension   . Hypokalemia   . Lung cancer ([place]) dx'd 09/2005   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 had chemo and radiation for lung ca  . Thrush 12/11/2010    ALLERGIES:  is allergic to contrast media [iodinated diagnostic agents]; iohexol; sulfa drugs cross reactors; and co-trimoxazole injection [sulfamethoxazole-trimethoprim].  MEDICATIONS:  Current Outpatient Medications  Medication Sig Dispense Refill  . amLODipine (NORVASC) 10 MG tablet TAKE 1 TABLET (10 MG TOTAL) BY MOUTH DAILY. 90 tablet 1  . aspirin 325 MG EC tablet TAKE 1 TABLET BY MOUTH EVERY DAY 90 tablet 0  . atorvastatin (LIPITOR) 40 MG tablet TAKE 1 TABLET BY MOUTH EVERY DAY AT 6 PM 90 tablet 3  . diphenhydrAMINE (BENADRYL) 25 MG tablet Take 1 tablet (25 mg total) by mouth once. 2 hours before CT scan on 09/18/2015 10 tablet 0  .  FeFum-FePoly-FA-B Cmp-C-Biot (INTEGRA PLUS) CAPS Take 1 capsule by mouth every morning. 90 capsule 2  . fluticasone (FLONASE) 50 MCG/ACT nasal spray Place 1-2 sprays into both nostrils daily as needed. 16 g 5  . folic acid (FOLVITE) 1 MG tablet TAKE 1 TABLET BY MOUTH EVERY DAY 90 tablet 1  . furosemide (LASIX) 20 MG tablet TAKE 1 TABLET (20 MG TOTAL) BY  MOUTH DAILY. AS NEEDED FOR SWELLING. 20 tablet 0  . lacosamide (VIMPAT) 50 MG TABS tablet Take by mouth.    . lidocaine-prilocaine (EMLA) cream Apply to Port-A-Cath as directed 1-2 hours prior to chemotherapy 30 g 1  . lisinopril (PRINIVIL,ZESTRIL) 20 MG tablet TAKE 2 TABLETS (40 MG TOTAL) BY MOUTH DAILY. 180 tablet 1  . morphine (MS CONTIN) 60 MG 12 hr tablet Take 1 tablet (60 mg total) by mouth 2 (two) times daily. 60 tablet 0  . morphine (MSIR) 30 MG tablet Take 1 tablet (30 mg total) by mouth every 4 (four) hours as needed (breakthrough pain). 60 tablet 0  . Olopatadine HCl 0.2 % SOLN Apply 1 drop to eye daily. 2.5 mL 11  . ondansetron (ZOFRAN) 8 MG tablet TAKE 1 TABLET (8 MG TOTAL) BY MOUTH EVERY 8 (EIGHT) HOURS AS NEEDED FOR NAUSEA OR VOMITING. 30 tablet 0  . predniSONE (DELTASONE) 50 MG tablet 1 tablet by mouth 13 hours ,7 hours and 1 hour  before CT scan (Patient not taking: Reported on 04/08/2017) 3 tablet 3  . prochlorperazine (COMPAZINE) 10 MG tablet Take 10 mg by mouth.    . triamcinolone (KENALOG) 0.025 % ointment Apply 1 application topically 2 (two) times daily. 30 g 0   No current facility-administered medications for this visit.     SURGICAL HISTORY:  Past Surgical History:  Procedure Laterality Date  . CERVICAL LAMINECTOMY  1995  . KNEE SURGERY  1990   Left x 2  . 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 LOOP RECORDER IMPLANT N/A 04/19/2014   Procedure: LOOP RECORDER IMPLANT;  Surgeon: Thompson Grayer, MD;  Location: [facility] CATH LAB;  Service: Cardiovascular;  Laterality: N/A;  . OTHER SURGICAL HISTORY  2008   Gamma knife surgery to remove brain met   . PORTACATH PLACEMENT  -[name]   TIP IN CAVOATRIAL JUNCTION  . TEE WITHOUT CARDIOVERSION N/A 04/19/2014   Procedure: TRANSESOPHAGEAL ECHOCARDIOGRAM (TEE);  Surgeon: Larey Dresser, MD;  Location: [facility];  Service: Cardiovascular;  Laterality: N/A;    REVIEW OF SYSTEMS:  Constitutional:  negative Eyes: negative Ears, nose, mouth, throat, and face: negative Respiratory: positive for cough Cardiovascular: negative Gastrointestinal: negative Genitourinary:negative Integument/breast: negative Hematologic/lymphatic: negative Musculoskeletal:negative Neurological: negative Behavioral/Psych: negative Endocrine: negative Allergic/Immunologic: negative   PHYSICAL EXAMINATION: General appearance: alert, cooperative and no distress Head: Normocephalic, without obvious abnormality, atraumatic Neck: no adenopathy, no JVD, supple, symmetrical, trachea midline and thyroid not enlarged, symmetric, no tenderness/mass/nodules Lymph nodes: Cervical, supraclavicular, and axillary nodes normal. Resp: clear to auscultation bilaterally Back: symmetric, no curvature. ROM normal. No CVA tenderness. Cardio: regular rate and rhythm, S1, S2 normal, no murmur, click, rub or gallop GI: soft, non-tender; bowel sounds normal; no masses,  no organomegaly Extremities: extremities normal, atraumatic, no cyanosis or edema Neurologic: Alert and oriented X 3, normal strength and tone. Normal symmetric reflexes. Normal coordination and gait  ECOG PERFORMANCE STATUS: 0 - Asymptomatic  Blood pressure (!) 111/56, pulse 90, temperature 97.7 F (36.5 C), resp. rate 17, height _0  (1.6  m), weight 113 lb 14.4 oz (51.7 kg), SpO2 94 %.  LABORATORY DATA: Lab Results  Component Value Date   WBC 11.1 (H) 05/20/2017   HGB 11.3 (L) 05/20/2017   HCT 35.4 05/20/2017   MCV 95.9 05/20/2017   PLT 372 05/20/2017      Chemistry      Component Value Date/Time   NA 141 04/29/2017 0849   NA 142 12/24/2016 0816   K 4.0 04/29/2017 0849   K 4.4 12/24/2016 0816   CL 104 04/29/2017 0849   CL 106 06/30/2012 1004   CO2 28 04/29/2017 0849   CO2 28 12/24/2016 0816   BUN 20 04/29/2017 0849   BUN 15.8 12/24/2016 0816   CREATININE 0.92 04/29/2017 0849   CREATININE 1.1 12/24/2016 0816      Component Value Date/Time    CALCIUM 9.5 04/29/2017 0849   CALCIUM 9.6 12/24/2016 0816   ALKPHOS 81 04/29/2017 0849   ALKPHOS 80 12/24/2016 0816   AST 23 04/29/2017 0849   AST 20 12/24/2016 0816   ALT 14 04/29/2017 0849   ALT 11 12/24/2016 0816   BILITOT 0.3 04/29/2017 0849   BILITOT 0.34 12/24/2016 0816       RADIOGRAPHIC STUDIES: Ct Chest W Contrast  Result Date: 05/19/2017 CLINICAL DATA:  67 year old female with history of metastatic lung cancer diagnosed in 2007 status post gamma knife therapy (now complete). Chemotherapy in progress. Followup study. EXAM: CT CHEST, ABDOMEN, AND PELVIS WITH CONTRAST TECHNIQUE: Multidetector CT imaging of the chest, abdomen and pelvis was performed following the standard protocol during bolus administration of intravenous contrast. CONTRAST:  190m OMNIPAQUE IOHEXOL 300 MG/ML  SOLN COMPARISON:  Multiple priors, most recently CT the chest, abdomen and pelvis 01/10/2017. FINDINGS: CT CHEST FINDINGS Cardiovascular: Heart size is normal. There is no significant pericardial fluid, thickening or pericardial calcification. There is aortic atherosclerosis, as well as atherosclerosis of the great vessels of the mediastinum and the coronary arteries, including calcified atherosclerotic plaque in the left main, left anterior descending, left circumflex and right coronary arteries. Right internal jugular single-lumen porta cath with tip terminating in the right atrium. Mediastinum/Nodes: No pathologically enlarged mediastinal or hilar lymph nodes. Esophagus is unremarkable in appearance. No axillary lymphadenopathy. Lungs/Pleura: There continues to be chronic postradiation changes throughout the medial aspect of the upper right lung extending into the apex where there is persistent pleuroparenchymal thickening and nodular architectural distortion (axial image 24 of series 7), which is unchanged at this site of the treated right apical lesion. In the anterior aspect of the right upper lobe there is an  additional area of pleural-based nodular architectural distortion which is also stable over serial prior examinations, most compatible with a small focus of postradiation fibrosis (axial image 49 of series 7). No other suspicious appearing pulmonary nodules or masses are noted. Small chronic right pleural effusion has decreased slightly in size compared to prior studies, but again demonstrates mild diffuse pleural thickening and enhancement which is unchanged. This is also associated with some chronic pleuroparenchymal architectural distortion and volume loss in the basal segments of the right lower lobe, as well as some chronic mucoid impaction throughout this region, also unchanged. Diffuse bronchial wall thickening with moderate centrilobular and paraseptal emphysema. Musculoskeletal: Chronic compression fracture of T3 with vertebra plana appearance is unchanged. Post vertebroplasty changes noted at T4 and T7. There are no aggressive appearing lytic or blastic lesions noted in the visualized portions of the skeleton. CT ABDOMEN PELVIS FINDINGS Hepatobiliary: No suspicious cystic or solid  hepatic lesions. No intra or extrahepatic biliary ductal dilatation. Gallbladder is normal in appearance. Pancreas: No pancreatic mass. No pancreatic ductal dilatation. No pancreatic or peripancreatic fluid or inflammatory changes. Spleen: Unremarkable. Adrenals/Urinary Tract: Subcentimeter low-attenuation lesions in the interpolar region of the left kidney and upper pole of the right kidney are too small to definitively characterize, but are similar to prior studies, presumably tiny cysts. No other suspicious renal lesions. No hydroureteronephrosis. Urinary bladder is normal in appearance. Bilateral adrenal glands are normal in appearance. Stomach/Bowel: Normal appearance of the stomach. No pathologic dilatation of small bowel or colon. Normal appendix. Vascular/Lymphatic: Aortic atherosclerosis, without evidence of aneurysm or  dissection in the abdominal or pelvic vasculature. No lymphadenopathy noted in the abdomen or pelvis. Reproductive: Uterus and ovaries are a trophic. Other: No significant volume of ascites.  No pneumoperitoneum. Musculoskeletal: There are no aggressive appearing lytic or blastic lesions noted in the visualized portions of the skeleton. IMPRESSION: 1. Today's examination again demonstrates stable postradiation changes in the right hemithorax, as discussed above. No definitive findings to suggest local recurrence of disease. Additionally, no findings of metastatic disease elsewhere in the chest, abdomen or pelvis. 2. Aortic atherosclerosis, in addition to left main and 3 vessel coronary artery disease. Please note that although the presence of coronary artery calcium documents the presence of coronary artery disease, the severity of this disease and any potential stenosis cannot be assessed on this non-gated CT examination. Assessment for potential risk factor modification, dietary therapy or pharmacologic therapy may be warranted, if clinically indicated. 3. Diffuse bronchial wall thickening with moderate centrilobular and paraseptal emphysema; imaging findings suggestive of underlying COPD. 4. Additional incidental findings, as above. Aortic Atherosclerosis (ICD10-I70.0) and Emphysema (ICD10-J43.9). Electronically Signed   By: Vinnie Langton M.D.   On: 05/19/2017 10:04   Ct Abdomen Pelvis W Contrast  Result Date: 05/19/2017 CLINICAL DATA:  67 year old female with history of metastatic lung cancer diagnosed in 2007 status post gamma knife therapy (now complete). Chemotherapy in progress. Followup study. EXAM: CT CHEST, ABDOMEN, AND PELVIS WITH CONTRAST TECHNIQUE: Multidetector CT imaging of the chest, abdomen and pelvis was performed following the standard protocol during bolus administration of intravenous contrast. CONTRAST:  133m OMNIPAQUE IOHEXOL 300 MG/ML  SOLN COMPARISON:  Multiple priors, most recently  CT the chest, abdomen and pelvis 01/10/2017. FINDINGS: CT CHEST FINDINGS Cardiovascular: Heart size is normal. There is no significant pericardial fluid, thickening or pericardial calcification. There is aortic atherosclerosis, as well as atherosclerosis of the great vessels of the mediastinum and the coronary arteries, including calcified atherosclerotic plaque in the left main, left anterior descending, left circumflex and right coronary arteries. Right internal jugular single-lumen porta cath with tip terminating in the right atrium. Mediastinum/Nodes: No pathologically enlarged mediastinal or hilar lymph nodes. Esophagus is unremarkable in appearance. No axillary lymphadenopathy. Lungs/Pleura: There continues to be chronic postradiation changes throughout the medial aspect of the upper right lung extending into the apex where there is persistent pleuroparenchymal thickening and nodular architectural distortion (axial image 24 of series 7), which is unchanged at this site of the treated right apical lesion. In the anterior aspect of the right upper lobe there is an additional area of pleural-based nodular architectural distortion which is also stable over serial prior examinations, most compatible with a small focus of postradiation fibrosis (axial image 49 of series 7). No other suspicious appearing pulmonary nodules or masses are noted. Small chronic right pleural effusion has decreased slightly in size compared to prior studies, but again demonstrates  mild diffuse pleural thickening and enhancement which is unchanged. This is also associated with some chronic pleuroparenchymal architectural distortion and volume loss in the basal segments of the right lower lobe, as well as some chronic mucoid impaction throughout this region, also unchanged. Diffuse bronchial wall thickening with moderate centrilobular and paraseptal emphysema. Musculoskeletal: Chronic compression fracture of T3 with vertebra plana appearance  is unchanged. Post vertebroplasty changes noted at T4 and T7. There are no aggressive appearing lytic or blastic lesions noted in the visualized portions of the skeleton. CT ABDOMEN PELVIS FINDINGS Hepatobiliary: No suspicious cystic or solid hepatic lesions. No intra or extrahepatic biliary ductal dilatation. Gallbladder is normal in appearance. Pancreas: No pancreatic mass. No pancreatic ductal dilatation. No pancreatic or peripancreatic fluid or inflammatory changes. Spleen: Unremarkable. Adrenals/Urinary Tract: Subcentimeter low-attenuation lesions in the interpolar region of the left kidney and upper pole of the right kidney are too small to definitively characterize, but are similar to prior studies, presumably tiny cysts. No other suspicious renal lesions. No hydroureteronephrosis. Urinary bladder is normal in appearance. Bilateral adrenal glands are normal in appearance. Stomach/Bowel: Normal appearance of the stomach. No pathologic dilatation of small bowel or colon. Normal appendix. Vascular/Lymphatic: Aortic atherosclerosis, without evidence of aneurysm or dissection in the abdominal or pelvic vasculature. No lymphadenopathy noted in the abdomen or pelvis. Reproductive: Uterus and ovaries are a trophic. Other: No significant volume of ascites.  No pneumoperitoneum. Musculoskeletal: There are no aggressive appearing lytic or blastic lesions noted in the visualized portions of the skeleton. IMPRESSION: 1. Today's examination again demonstrates stable postradiation changes in the right hemithorax, as discussed above. No definitive findings to suggest local recurrence of disease. Additionally, no findings of metastatic disease elsewhere in the chest, abdomen or pelvis. 2. Aortic atherosclerosis, in addition to left main and 3 vessel coronary artery disease. Please note that although the presence of coronary artery calcium documents the presence of coronary artery disease, the severity of this disease and any  potential stenosis cannot be assessed on this non-gated CT examination. Assessment for potential risk factor modification, dietary therapy or pharmacologic therapy may be warranted, if clinically indicated. 3. Diffuse bronchial wall thickening with moderate centrilobular and paraseptal emphysema; imaging findings suggestive of underlying COPD. 4. Additional incidental findings, as above. Aortic Atherosclerosis (ICD10-I70.0) and Emphysema (ICD10-J43.9). Electronically Signed   By: Vinnie Langton M.D.   On: 05/19/2017 10:04    ASSESSMENT AND PLAN: This is a very pleasant 67 years old white female with metastatic non-small cell lung cancer diagnosed in October 2007 status post concurrent chemoradiation followed by consolidation chemotherapy and she is currently on maintenance treatment with single agent Alimta status post 168 cycles. The patient continues to tolerate her treatment fairly well with no concerning complaints. She had a repeat CT scan of the chest, abdomen and pelvis performed recently.  I personally and independently reviewed the scans and discussed the results with the patient and her daughter today. Her scan showed no concerning findings for disease progression. I recommended for the patient to continue her current treatment with maintenance Alimta and she will proceed with cycle 169 today. For the swelling of the lower extremities, she will continue Lasix. For the seasonal allergies she was advised to use over-the-counter allergy medications. For the atherosclerosis, I strongly recommend for the patient to see her primary care physician for evaluation and referral to cardiology if needed. She will come back for follow-up visit in 3 weeks with the next cycle of her treatment. The patient was advised  to call immediately if she has any concerning symptoms in the interval. The patient voices understanding of current disease status and treatment options and is in agreement with the current  care plan. All questions were answered. The patient knows to call the clinic with any problems, questions or concerns. We can certainly see the patient much sooner if necessary.  Disclaimer: This note was dictated with voice recognition software. Similar sounding words can inadvertently be transcribed and may not be corrected upon review.

## 2017-05-20 NOTE — Patient Instructions (Signed)
[facility] Discharge Instructions for Patients Receiving Chemotherapy  Today you received the following chemotherapy agents:  Alimta  To help prevent nausea and vomiting after your treatment, we encourage you to take your nausea medication as prescribed.   If you develop nausea and vomiting that is not controlled by your nausea medication, call the clinic.   BELOW ARE SYMPTOMS THAT SHOULD BE REPORTED IMMEDIATELY:  *FEVER GREATER THAN 100.5 F  *CHILLS WITH OR WITHOUT FEVER  NAUSEA AND VOMITING THAT IS NOT CONTROLLED WITH YOUR NAUSEA MEDICATION  *UNUSUAL SHORTNESS OF BREATH  *UNUSUAL BRUISING OR BLEEDING  TENDERNESS IN MOUTH AND THROAT WITH OR WITHOUT PRESENCE OF ULCERS  *URINARY PROBLEMS  *BOWEL PROBLEMS  UNUSUAL RASH Items with * indicate a potential emergency and should be followed up as soon as possible.  Feel free to call the clinic should you have any questions or concerns. The clinic phone number is [phone].  Please show the [facility] at check-in to the Emergency Department and triage nurse.

## 2017-05-21 ENCOUNTER — Telehealth: Payer: Self-pay | Admitting: Internal Medicine

## 2017-05-21 NOTE — Telephone Encounter (Signed)
Scheduled appt per 5/14 los - pt to get an updated schedule next visit

## 2017-05-25 ENCOUNTER — Other Ambulatory Visit: Payer: Self-pay | Admitting: Family Medicine

## 2017-05-27 ENCOUNTER — Telehealth: Payer: Self-pay | Admitting: Internal Medicine

## 2017-05-27 NOTE — Telephone Encounter (Signed)
Called pt and left message regarding reschedule of appt from 6/4 to 6/3 to arrive at 9:30 am per MM request.

## 2017-06-05 ENCOUNTER — Other Ambulatory Visit: Payer: Self-pay | Admitting: Internal Medicine

## 2017-06-05 DIAGNOSIS — C7931 Secondary malignant neoplasm of brain: Secondary | ICD-10-CM

## 2017-06-06 ENCOUNTER — Other Ambulatory Visit: Payer: Self-pay | Admitting: Internal Medicine

## 2017-06-09 ENCOUNTER — Encounter: Payer: Self-pay | Admitting: Oncology

## 2017-06-09 ENCOUNTER — Inpatient Hospital Stay: Payer: Medicare Other | Attending: Internal Medicine

## 2017-06-09 ENCOUNTER — Inpatient Hospital Stay: Payer: Medicare Other

## 2017-06-09 ENCOUNTER — Inpatient Hospital Stay (HOSPITAL_BASED_OUTPATIENT_CLINIC_OR_DEPARTMENT_OTHER): Payer: Medicare Other | Admitting: Oncology

## 2017-06-09 ENCOUNTER — Other Ambulatory Visit: Payer: Self-pay

## 2017-06-09 VITALS — BP 129/64 | HR 80 | Temp 98.0°F | Resp 18 | Ht 63.0 in | Wt 112.2 lb

## 2017-06-09 DIAGNOSIS — Z95828 Presence of other vascular implants and grafts: Secondary | ICD-10-CM

## 2017-06-09 DIAGNOSIS — M7989 Other specified soft tissue disorders: Secondary | ICD-10-CM | POA: Diagnosis not present

## 2017-06-09 DIAGNOSIS — Z5111 Encounter for antineoplastic chemotherapy: Secondary | ICD-10-CM | POA: Insufficient documentation

## 2017-06-09 DIAGNOSIS — C3490 Malignant neoplasm of unspecified part of unspecified bronchus or lung: Secondary | ICD-10-CM

## 2017-06-09 DIAGNOSIS — C3411 Malignant neoplasm of upper lobe, right bronchus or lung: Secondary | ICD-10-CM

## 2017-06-09 DIAGNOSIS — C3410 Malignant neoplasm of upper lobe, unspecified bronchus or lung: Secondary | ICD-10-CM

## 2017-06-09 LAB — COMPREHENSIVE METABOLIC PANEL
ALT: 9 U/L (ref 0–55)
ANION GAP: 8 (ref 3–11)
AST: 18 U/L (ref 5–34)
Albumin: 2.9 g/dL — ABNORMAL LOW (ref 3.5–5.0)
Alkaline Phosphatase: 70 U/L (ref 40–150)
BUN: 15 mg/dL (ref 7–26)
CHLORIDE: 106 mmol/L (ref 98–109)
CO2: 26 mmol/L (ref 22–29)
Calcium: 9.2 mg/dL (ref 8.4–10.4)
Creatinine, Ser: 0.84 mg/dL (ref 0.60–1.10)
Glucose, Bld: 192 mg/dL — ABNORMAL HIGH (ref 70–140)
POTASSIUM: 3.9 mmol/L (ref 3.5–5.1)
Sodium: 140 mmol/L (ref 136–145)
Total Bilirubin: 0.4 mg/dL (ref 0.2–1.2)
Total Protein: 7 g/dL (ref 6.4–8.3)

## 2017-06-09 LAB — CBC WITH DIFFERENTIAL/PLATELET
Basophils Absolute: 0.1 10*3/uL (ref 0.0–0.1)
Basophils Relative: 1 %
EOS ABS: 0 10*3/uL (ref 0.0–0.5)
Eosinophils Relative: 0 %
HCT: 31.5 % — ABNORMAL LOW (ref 34.8–46.6)
Hemoglobin: 10.6 g/dL — ABNORMAL LOW (ref 11.6–15.9)
LYMPHS ABS: 1.1 10*3/uL (ref 0.9–3.3)
LYMPHS PCT: 10 %
MCH: 30.9 pg (ref 25.1–34.0)
MCHC: 33.6 g/dL (ref 31.5–36.0)
MCV: 91.8 fL (ref 79.5–101.0)
MONO ABS: 0.8 10*3/uL (ref 0.1–0.9)
MONOS PCT: 7 %
Neutro Abs: 8.7 10*3/uL — ABNORMAL HIGH (ref 1.5–6.5)
Neutrophils Relative %: 82 %
PLATELETS: 592 10*3/uL — AB (ref 145–400)
RBC: 3.43 MIL/uL — ABNORMAL LOW (ref 3.70–5.45)
RDW: 15.1 % — AB (ref 11.2–14.5)
WBC: 10.7 10*3/uL — ABNORMAL HIGH (ref 3.9–10.3)

## 2017-06-09 MED ORDER — SODIUM CHLORIDE 0.9 % IV SOLN
Freq: Once | INTRAVENOUS | Status: AC
  Administered 2017-06-09: 11:00:00 via INTRAVENOUS

## 2017-06-09 MED ORDER — OLOPATADINE HCL 0.2 % OP SOLN: 1.0000 [drp] | Freq: Every day | OPHTHALMIC | 1 refills | Status: DC

## 2017-06-09 MED ORDER — HEPARIN SOD (PORK) LOCK FLUSH 100 UNIT/ML IV SOLN
500.0000 [IU] | Freq: Once | INTRAVENOUS | Status: AC | PRN
  Administered 2017-06-09: 500 [IU]
  Filled 2017-06-09: qty 5

## 2017-06-09 MED ORDER — MORPHINE SULFATE ER 60 MG PO TBCR: 60.0000 mg | EXTENDED_RELEASE_TABLET | Freq: Two times a day (BID) | ORAL | 0 refills | Status: DC

## 2017-06-09 MED ORDER — SODIUM CHLORIDE 0.9 % IJ SOLN
10.0000 mL | INTRAMUSCULAR | Status: DC | PRN
  Administered 2017-06-09: 10 mL
  Filled 2017-06-09: qty 10

## 2017-06-09 MED ORDER — DEXAMETHASONE SODIUM PHOSPHATE 10 MG/ML IJ SOLN
INTRAMUSCULAR | Status: AC
  Filled 2017-06-09: qty 1

## 2017-06-09 MED ORDER — SODIUM CHLORIDE 0.9% FLUSH
10.0000 mL | Freq: Once | INTRAVENOUS | Status: AC
  Administered 2017-06-09: 10 mL
  Filled 2017-06-09: qty 10

## 2017-06-09 MED ORDER — ONDANSETRON HCL 8 MG PO TABS: 8.0000 mg | ORAL_TABLET | Freq: Once | ORAL | Status: DC

## 2017-06-09 MED ORDER — MORPHINE SULFATE 30 MG PO TABS: 30.0000 mg | ORAL_TABLET | ORAL | 0 refills | Status: DC | PRN

## 2017-06-09 MED ORDER — SODIUM CHLORIDE 0.9 % IV SOLN
500.0000 mg/m2 | Freq: Once | INTRAVENOUS | Status: AC
  Administered 2017-06-09: 800 mg via INTRAVENOUS
  Filled 2017-06-09: qty 12

## 2017-06-09 MED ORDER — DEXAMETHASONE SODIUM PHOSPHATE 10 MG/ML IJ SOLN
10.0000 mg | Freq: Once | INTRAMUSCULAR | Status: AC
  Administered 2017-06-09: 10 mg via INTRAVENOUS

## 2017-06-09 NOTE — Progress Notes (Signed)
[facility] OFF[facility] PROGRESS NOTE  [name], [place] [id number]  DIAGNOSIS: Metastatic non-small cell lung cancer initially diagnosed as locally advanced stage IIIB with a right Pancoast tumor involving the vertebral body as well as prominent canal invasion with spinal cord compression in October 2007. The patient also has metastatic disease to the brain in April 2008.  PRIOR THERAPY: 1. Status post concurrent chemoradiation with weekly carboplatin and paclitaxel, last dose was given November 18, 2005. 2. 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 3. Status post gamma knife radiotherapy to a solitary brain lesion located in the superior frontal area of the brain at [facility] in April of 2008. 4. Status post palliative radiotherapy to the lateral abdominal wall metastatic lesion under the care of Dr. Lisbeth Renshaw, completed March of 2009. 5. Status post 6 cycles of systemic chemotherapy with carboplatin and Alimta. Last dose was given July 26, 2007 with disease stabilization. 6. Gamma knife stereotactic radiotherapy to 2 brain lesions one involving the right frontal dural based as well as right parietal lesion performed on 05/07/2012 under the care of Dr. Vallarie Mare at [facility]  CURRENT THERAPY: Maintenance systemic chemotherapy with Alimta 500 MG/M2 every 3 weeks, status post 170 cycles.  INTERVAL HISTORY: Danielle [name] 67 y.o. female returns for a routine follow-up visit accompanied by her daughter.  The patient is feeling fine today with no specific complaints.  She has started taking Claritin recently for seasonal allergies.  She see some improvement in her symptoms.  The patient denies fevers and chills.  Denies chest pain, shortness of breath, cough, hemoptysis.  Denies nausea, vomiting, constipation, diarrhea.  Denies recent weight loss or night sweats.  The  patient continues to tolerate treatment with Alimta fairly well.  The patient is here for evaluation prior to starting cycle #170.  MEDICAL HISTORY: Past Medical History:  Diagnosis Date  . Anemia   . 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 CVA (cerebral vascular accident) ([place]) 04/2014   pt states had 2 cva's within 2 wks  . DJD (degenerative joint disease), cervical   . Fibromyalgia   . H/O: pneumonia   . History of tobacco abuse quit 10/08   on nicotine patch  . Hypertension   . Hypokalemia   . Lung cancer ([place]) dx'd 09/2005   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 had chemo and radiation for lung ca  . Thrush 12/11/2010    ALLERGIES:  is allergic to contrast media [iodinated diagnostic agents]; iohexol; sulfa drugs cross reactors; and co-trimoxazole injection [sulfamethoxazole-trimethoprim].  MEDICATIONS:  Current Outpatient Medications  Medication Sig Dispense Refill  . amLODipine (NORVASC) 10 MG tablet TAKE 1 TABLET (10 MG TOTAL) BY MOUTH DAILY. 90 tablet 1  . aspirin 325 MG EC tablet TAKE 1 TABLET BY MOUTH EVERY DAY 90 tablet 0  . atorvastatin (LIPITOR) 40 MG tablet TAKE 1 TABLET BY MOUTH EVERY DAY AT 6 PM 90 tablet 3  . FeFum-FePoly-FA-B Cmp-C-Biot (INTEGRA PLUS) CAPS Take 1 capsule by mouth every morning. 90 capsule 2  . fluticasone (FLONASE) 50 MCG/ACT nasal spray Place 1-2 sprays into both nostrils daily as needed. 16 g 5  . folic acid (FOLVITE) 1 MG tablet TAKE 1 TABLET BY MOUTH EVERY DAY 90 tablet 1  . furosemide (LASIX) 20 MG tablet TAKE 1 TABLET (20 MG TOTAL) BY MOUTH DAILY. AS NEEDED FOR SWELLING. 20 tablet 0  .  lacosamide (VIMPAT) 50 MG TABS tablet Take by mouth.    . lidocaine-prilocaine (EMLA) cream Apply to Port-A-Cath as directed 1-2 hours prior to chemotherapy 30 g 1  . lisinopril (PRINIVIL,ZESTRIL) 20 MG tablet TAKE 2 TABLETS (40 MG TOTAL) BY MOUTH DAILY. 180 tablet 1  . morphine (MS CONTIN) 60 MG 12 hr tablet Take 1 tablet (60 mg total) by  mouth 2 (two) times daily. 60 tablet 0  . morphine (MSIR) 30 MG tablet Take 1 tablet (30 mg total) by mouth every 4 (four) hours as needed (breakthrough pain). 60 tablet 0  . Olopatadine HCl 0.2 % SOLN Apply 1 drop to eye daily. 2.5 mL 1  . ondansetron (ZOFRAN) 8 MG tablet TAKE 1 TABLET (8 MG TOTAL) BY MOUTH EVERY 8 (EIGHT) HOURS AS NEEDED FOR NAUSEA OR VOMITING. 30 tablet 0  . prochlorperazine (COMPAZINE) 10 MG tablet Take 10 mg by mouth.    . triamcinolone (KENALOG) 0.025 % ointment Apply 1 application topically 2 (two) times daily. 30 g 0  . diphenhydrAMINE (BENADRYL) 25 MG tablet Take 1 tablet (25 mg total) by mouth once. 2 hours before CT scan on 09/18/2015 10 tablet 0  . predniSONE (DELTASONE) 50 MG tablet 1 tablet by mouth 13 hours ,7 hours and 1 hour  before CT scan (Patient not taking: Reported on 04/08/2017) 3 tablet 3   No current facility-administered medications for this visit.    Facility-Administered Medications Ordered in Other Visits  Medication Dose Route Frequency Provider Last Rate Last Dose  . ondansetron (ZOFRAN) tablet 8 mg  8 mg Oral Once Curt Bears, MD      . sodium chloride 0.9 % injection 10 mL  10 mL Intracatheter PRN Curt Bears, MD   10 mL at 06/09/17 1227    SURGICAL HISTORY:  Past Surgical History:  Procedure Laterality Date  . CERVICAL LAMINECTOMY  1995  . KNEE SURGERY  1990   Left x 2  . 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 LOOP RECORDER IMPLANT N/A 04/19/2014   Procedure: LOOP RECORDER IMPLANT;  Surgeon: Thompson Grayer, MD;  Location: [facility] CATH LAB;  Service: Cardiovascular;  Laterality: N/A;  . OTHER SURGICAL HISTORY  2008   Gamma knife surgery to remove brain met   . PORTACATH PLACEMENT  -[name]   TIP IN CAVOATRIAL JUNCTION  . TEE WITHOUT CARDIOVERSION N/A 04/19/2014   Procedure: TRANSESOPHAGEAL ECHOCARDIOGRAM (TEE);  Surgeon: Larey Dresser, MD;  Location: [facility];  Service: Cardiovascular;  Laterality:  N/A;    REVIEW OF SYSTEMS:   Review of Systems  Constitutional: Negative for appetite change, chills, fatigue, fever and unexpected weight change.  HENT:   Negative for mouth sores, nosebleeds, sore throat and trouble swallowing.  Positive for seasonal allergies.  Eyes: Negative for eye problems and icterus.  Respiratory: Negative for cough, hemoptysis, shortness of breath and wheezing.   Cardiovascular: Negative for chest pain and leg swelling.  Gastrointestinal: Negative for abdominal pain, constipation, diarrhea, nausea and vomiting.  Genitourinary: Negative for bladder incontinence, difficulty urinating, dysuria, frequency and hematuria.   Musculoskeletal: Negative for back pain, gait problem, neck pain and neck stiffness.  Skin: Negative for itching and rash.  Neurological: Negative for dizziness, extremity weakness, gait problem, headaches, light-headedness and seizures.  Hematological: Negative for adenopathy. Does not bruise/bleed easily.  Psychiatric/Behavioral: Negative for confusion, depression and sleep disturbance. The patient is not nervous/anxious.     PHYSICAL EXAMINATION:  Blood pressure 129/64,  pulse 80, temperature 98 F (36.7 C), temperature source Oral, resp. rate 18, height _0  (1.6 m), weight 112 lb 3.2 oz (50.9 kg), SpO2 97 %.  ECOG PERFORMANCE STATUS: 1 - Symptomatic but completely ambulatory  Physical Exam  Constitutional: Oriented to person, place, and time and well-developed, well-nourished, and in no distress. No distress.  HENT:  Head: Normocephalic and atraumatic.  Mouth/Throat: Oropharynx is clear and moist. No oropharyngeal exudate.  Eyes: Conjunctivae are normal. Right eye exhibits no discharge. Left eye exhibits no discharge. No scleral icterus.  Neck: Normal range of motion. Neck supple.  Cardiovascular: Normal rate, regular rhythm, normal heart sounds and intact distal pulses.   Pulmonary/Chest: Effort normal and breath sounds normal. No  respiratory distress. No wheezes. No rales.  Abdominal: Soft. Bowel sounds are normal. Exhibits no distension and no mass. There is no tenderness.  Musculoskeletal: Normal range of motion. Exhibits no edema.  Lymphadenopathy:    No cervical adenopathy.  Neurological: Alert and oriented to person, place, and time. Exhibits normal muscle tone. Gait normal. Coordination normal.  Skin: Skin is warm and dry. No rash noted. Not diaphoretic. No erythema. No pallor.  Psychiatric: Mood, memory and judgment normal.  Vitals reviewed.  LABORATORY DATA: Lab Results  Component Value Date   WBC 10.7 (H) 06/09/2017   HGB 10.6 (L) 06/09/2017   HCT 31.5 (L) 06/09/2017   MCV 91.8 06/09/2017   PLT 592 (H) 06/09/2017      Chemistry      Component Value Date/Time   NA 140 06/09/2017 0903   NA 142 12/24/2016 0816   K 3.9 06/09/2017 0903   K 4.4 12/24/2016 0816   CL 106 06/09/2017 0903   CL 106 06/30/2012 1004   CO2 26 06/09/2017 0903   CO2 28 12/24/2016 0816   BUN 15 06/09/2017 0903   BUN 15.8 12/24/2016 0816   CREATININE 0.84 06/09/2017 0903   CREATININE 1.1 12/24/2016 0816      Component Value Date/Time   CALCIUM 9.2 06/09/2017 0903   CALCIUM 9.6 12/24/2016 0816   ALKPHOS 70 06/09/2017 0903   ALKPHOS 80 12/24/2016 0816   AST 18 06/09/2017 0903   AST 20 12/24/2016 0816   ALT 9 06/09/2017 0903   ALT 11 12/24/2016 0816   BILITOT 0.4 06/09/2017 0903   BILITOT 0.34 12/24/2016 0816       RADIOGRAPHIC STUDIES:  Ct Chest W Contrast  Result Date: 05/19/2017 CLINICAL DATA:  67 year old female with history of metastatic lung cancer diagnosed in 2007 status post gamma knife therapy (now complete). Chemotherapy in progress. Followup study. EXAM: CT CHEST, ABDOMEN, AND PELVIS WITH CONTRAST TECHNIQUE: Multidetector CT imaging of the chest, abdomen and pelvis was performed following the standard protocol during bolus administration of intravenous contrast. CONTRAST:  125m OMNIPAQUE IOHEXOL 300  MG/ML  SOLN COMPARISON:  Multiple priors, most recently CT the chest, abdomen and pelvis 01/10/2017. FINDINGS: CT CHEST FINDINGS Cardiovascular: Heart size is normal. There is no significant pericardial fluid, thickening or pericardial calcification. There is aortic atherosclerosis, as well as atherosclerosis of the great vessels of the mediastinum and the coronary arteries, including calcified atherosclerotic plaque in the left main, left anterior descending, left circumflex and right coronary arteries. Right internal jugular single-lumen porta cath with tip terminating in the right atrium. Mediastinum/Nodes: No pathologically enlarged mediastinal or hilar lymph nodes. Esophagus is unremarkable in appearance. No axillary lymphadenopathy. Lungs/Pleura: There continues to be chronic postradiation changes throughout the medial aspect of the upper right lung extending  into the apex where there is persistent pleuroparenchymal thickening and nodular architectural distortion (axial image 24 of series 7), which is unchanged at this site of the treated right apical lesion. In the anterior aspect of the right upper lobe there is an additional area of pleural-based nodular architectural distortion which is also stable over serial prior examinations, most compatible with a small focus of postradiation fibrosis (axial image 49 of series 7). No other suspicious appearing pulmonary nodules or masses are noted. Small chronic right pleural effusion has decreased slightly in size compared to prior studies, but again demonstrates mild diffuse pleural thickening and enhancement which is unchanged. This is also associated with some chronic pleuroparenchymal architectural distortion and volume loss in the basal segments of the right lower lobe, as well as some chronic mucoid impaction throughout this region, also unchanged. Diffuse bronchial wall thickening with moderate centrilobular and paraseptal emphysema. Musculoskeletal: Chronic  compression fracture of T3 with vertebra plana appearance is unchanged. Post vertebroplasty changes noted at T4 and T7. There are no aggressive appearing lytic or blastic lesions noted in the visualized portions of the skeleton. CT ABDOMEN PELVIS FINDINGS Hepatobiliary: No suspicious cystic or solid hepatic lesions. No intra or extrahepatic biliary ductal dilatation. Gallbladder is normal in appearance. Pancreas: No pancreatic mass. No pancreatic ductal dilatation. No pancreatic or peripancreatic fluid or inflammatory changes. Spleen: Unremarkable. Adrenals/Urinary Tract: Subcentimeter low-attenuation lesions in the interpolar region of the left kidney and upper pole of the right kidney are too small to definitively characterize, but are similar to prior studies, presumably tiny cysts. No other suspicious renal lesions. No hydroureteronephrosis. Urinary bladder is normal in appearance. Bilateral adrenal glands are normal in appearance. Stomach/Bowel: Normal appearance of the stomach. No pathologic dilatation of small bowel or colon. Normal appendix. Vascular/Lymphatic: Aortic atherosclerosis, without evidence of aneurysm or dissection in the abdominal or pelvic vasculature. No lymphadenopathy noted in the abdomen or pelvis. Reproductive: Uterus and ovaries are a trophic. Other: No significant volume of ascites.  No pneumoperitoneum. Musculoskeletal: There are no aggressive appearing lytic or blastic lesions noted in the visualized portions of the skeleton. IMPRESSION: 1. Today's examination again demonstrates stable postradiation changes in the right hemithorax, as discussed above. No definitive findings to suggest local recurrence of disease. Additionally, no findings of metastatic disease elsewhere in the chest, abdomen or pelvis. 2. Aortic atherosclerosis, in addition to left main and 3 vessel coronary artery disease. Please note that although the presence of coronary artery calcium documents the presence of  coronary artery disease, the severity of this disease and any potential stenosis cannot be assessed on this non-gated CT examination. Assessment for potential risk factor modification, dietary therapy or pharmacologic therapy may be warranted, if clinically indicated. 3. Diffuse bronchial wall thickening with moderate centrilobular and paraseptal emphysema; imaging findings suggestive of underlying COPD. 4. Additional incidental findings, as above. Aortic Atherosclerosis (ICD10-I70.0) and Emphysema (ICD10-J43.9). Electronically Signed   By: Vinnie Langton M.D.   On: 05/19/2017 10:04   Ct Abdomen Pelvis W Contrast  Result Date: 05/19/2017 CLINICAL DATA:  67 year old female with history of metastatic lung cancer diagnosed in 2007 status post gamma knife therapy (now complete). Chemotherapy in progress. Followup study. EXAM: CT CHEST, ABDOMEN, AND PELVIS WITH CONTRAST TECHNIQUE: Multidetector CT imaging of the chest, abdomen and pelvis was performed following the standard protocol during bolus administration of intravenous contrast. CONTRAST:  121m OMNIPAQUE IOHEXOL 300 MG/ML  SOLN COMPARISON:  Multiple priors, most recently CT the chest, abdomen and pelvis 01/10/2017. FINDINGS: CT CHEST  FINDINGS Cardiovascular: Heart size is normal. There is no significant pericardial fluid, thickening or pericardial calcification. There is aortic atherosclerosis, as well as atherosclerosis of the great vessels of the mediastinum and the coronary arteries, including calcified atherosclerotic plaque in the left main, left anterior descending, left circumflex and right coronary arteries. Right internal jugular single-lumen porta cath with tip terminating in the right atrium. Mediastinum/Nodes: No pathologically enlarged mediastinal or hilar lymph nodes. Esophagus is unremarkable in appearance. No axillary lymphadenopathy. Lungs/Pleura: There continues to be chronic postradiation changes throughout the medial aspect of the upper  right lung extending into the apex where there is persistent pleuroparenchymal thickening and nodular architectural distortion (axial image 24 of series 7), which is unchanged at this site of the treated right apical lesion. In the anterior aspect of the right upper lobe there is an additional area of pleural-based nodular architectural distortion which is also stable over serial prior examinations, most compatible with a small focus of postradiation fibrosis (axial image 49 of series 7). No other suspicious appearing pulmonary nodules or masses are noted. Small chronic right pleural effusion has decreased slightly in size compared to prior studies, but again demonstrates mild diffuse pleural thickening and enhancement which is unchanged. This is also associated with some chronic pleuroparenchymal architectural distortion and volume loss in the basal segments of the right lower lobe, as well as some chronic mucoid impaction throughout this region, also unchanged. Diffuse bronchial wall thickening with moderate centrilobular and paraseptal emphysema. Musculoskeletal: Chronic compression fracture of T3 with vertebra plana appearance is unchanged. Post vertebroplasty changes noted at T4 and T7. There are no aggressive appearing lytic or blastic lesions noted in the visualized portions of the skeleton. CT ABDOMEN PELVIS FINDINGS Hepatobiliary: No suspicious cystic or solid hepatic lesions. No intra or extrahepatic biliary ductal dilatation. Gallbladder is normal in appearance. Pancreas: No pancreatic mass. No pancreatic ductal dilatation. No pancreatic or peripancreatic fluid or inflammatory changes. Spleen: Unremarkable. Adrenals/Urinary Tract: Subcentimeter low-attenuation lesions in the interpolar region of the left kidney and upper pole of the right kidney are too small to definitively characterize, but are similar to prior studies, presumably tiny cysts. No other suspicious renal lesions. No hydroureteronephrosis.  Urinary bladder is normal in appearance. Bilateral adrenal glands are normal in appearance. Stomach/Bowel: Normal appearance of the stomach. No pathologic dilatation of small bowel or colon. Normal appendix. Vascular/Lymphatic: Aortic atherosclerosis, without evidence of aneurysm or dissection in the abdominal or pelvic vasculature. No lymphadenopathy noted in the abdomen or pelvis. Reproductive: Uterus and ovaries are a trophic. Other: No significant volume of ascites.  No pneumoperitoneum. Musculoskeletal: There are no aggressive appearing lytic or blastic lesions noted in the visualized portions of the skeleton. IMPRESSION: 1. Today's examination again demonstrates stable postradiation changes in the right hemithorax, as discussed above. No definitive findings to suggest local recurrence of disease. Additionally, no findings of metastatic disease elsewhere in the chest, abdomen or pelvis. 2. Aortic atherosclerosis, in addition to left main and 3 vessel coronary artery disease. Please note that although the presence of coronary artery calcium documents the presence of coronary artery disease, the severity of this disease and any potential stenosis cannot be assessed on this non-gated CT examination. Assessment for potential risk factor modification, dietary therapy or pharmacologic therapy may be warranted, if clinically indicated. 3. Diffuse bronchial wall thickening with moderate centrilobular and paraseptal emphysema; imaging findings suggestive of underlying COPD. 4. Additional incidental findings, as above. Aortic Atherosclerosis (ICD10-I70.0) and Emphysema (ICD10-J43.9). Electronically Signed   By: Quillian Quince  Entrikin M.D.   On: 05/19/2017 10:04     ASSESSMENT/PLAN:  Lung cancer, primary, with metastasis from lung to other site, unspecified laterality [facility]) This is a very pleasant 67 year old white female with metastatic non-small cell lung cancer diagnosed in October 2007 status post concurrent  chemoradiation followed by consolidation chemotherapy and she is currently on maintenance treatment with single agent Alimta status post 169 cycles. The patient continues to tolerate her treatment fairly well with no concerning complaints. Recommend for the patient to proceed with Alimta cycle #170 today as scheduled.  For the swelling of the lower extremities, she will continue Lasix.  For the seasonal allergies she was advised to use over-the-counter allergy medications.  For pain, her MS Contin and MSIR were refilled for her today.  She will come back for follow-up visit in 3 weeks with the next cycle of her treatment.  The patient was advised to call immediately if she has any concerning symptoms in the interval. The patient voices understanding of current disease status and treatment options and is in agreement with the current care plan. All questions were answered. The patient knows to call the clinic with any problems, questions or concerns. We can certainly see the patient much sooner if necessary.   No orders of the defined types were placed in this encounter.  Mikey Bussing, DNP, AGPCNP-BC, AOCNP 06/09/17

## 2017-06-09 NOTE — Patient Instructions (Signed)
[facility] Discharge Instructions for Patients Receiving Chemotherapy  Today you received the following chemotherapy agents :  Alimta.  To help prevent nausea and vomiting after your treatment, we encourage you to take your nausea medication as prescribed.   If you develop nausea and vomiting that is not controlled by your nausea medication, call the clinic.   BELOW ARE SYMPTOMS THAT SHOULD BE REPORTED IMMEDIATELY:  *FEVER GREATER THAN 100.5 F  *CHILLS WITH OR WITHOUT FEVER  NAUSEA AND VOMITING THAT IS NOT CONTROLLED WITH YOUR NAUSEA MEDICATION  *UNUSUAL SHORTNESS OF BREATH  *UNUSUAL BRUISING OR BLEEDING  TENDERNESS IN MOUTH AND THROAT WITH OR WITHOUT PRESENCE OF ULCERS  *URINARY PROBLEMS  *BOWEL PROBLEMS  UNUSUAL RASH Items with * indicate a potential emergency and should be followed up as soon as possible.  Feel free to call the clinic should you have any questions or concerns. The clinic phone number is [phone].  Please show the [facility] at check-in to the Emergency Department and triage nurse.

## 2017-06-09 NOTE — Assessment & Plan Note (Signed)
This is a very pleasant 67 year old white female with metastatic non-small cell lung cancer diagnosed in October 2007 status post concurrent chemoradiation followed by consolidation chemotherapy and she is currently on maintenance treatment with single agent Alimta status post 169 cycles. The patient continues to tolerate her treatment fairly well with no concerning complaints. Recommend for the patient to proceed with Alimta cycle #170 today as scheduled.  For the swelling of the lower extremities, she will continue Lasix.  For the seasonal allergies she was advised to use over-the-counter allergy medications.  For pain, her MS Contin and MSIR were refilled for her today.  She will come back for follow-up visit in 3 weeks with the next cycle of her treatment.  The patient was advised to call immediately if she has any concerning symptoms in the interval. The patient voices understanding of current disease status and treatment options and is in agreement with the current care plan. All questions were answered. The patient knows to call the clinic with any problems, questions or concerns. We can certainly see the patient much sooner if necessary.

## 2017-06-10 ENCOUNTER — Ambulatory Visit: Payer: Medicare Other

## 2017-06-10 ENCOUNTER — Ambulatory Visit: Payer: Medicare Other | Admitting: Internal Medicine

## 2017-06-10 ENCOUNTER — Other Ambulatory Visit: Payer: Medicare Other

## 2017-06-25 ENCOUNTER — Other Ambulatory Visit: Payer: Self-pay | Admitting: Internal Medicine

## 2017-06-25 DIAGNOSIS — C3411 Malignant neoplasm of upper lobe, right bronchus or lung: Secondary | ICD-10-CM

## 2017-06-25 DIAGNOSIS — C7931 Secondary malignant neoplasm of brain: Secondary | ICD-10-CM

## 2017-06-27 ENCOUNTER — Telehealth: Payer: Self-pay | Admitting: Medical Oncology

## 2017-06-27 NOTE — Telephone Encounter (Signed)
rr

## 2017-06-27 NOTE — Telephone Encounter (Signed)
confirmed appt.

## 2017-07-01 ENCOUNTER — Inpatient Hospital Stay: Payer: Medicare Other

## 2017-07-01 ENCOUNTER — Telehealth: Payer: Self-pay | Admitting: Internal Medicine

## 2017-07-01 ENCOUNTER — Encounter: Payer: Self-pay | Admitting: Internal Medicine

## 2017-07-01 ENCOUNTER — Inpatient Hospital Stay (HOSPITAL_BASED_OUTPATIENT_CLINIC_OR_DEPARTMENT_OTHER): Payer: Medicare Other | Admitting: Internal Medicine

## 2017-07-01 VITALS — BP 115/64 | HR 99 | Temp 97.8°F | Resp 17 | Ht 63.0 in | Wt 108.8 lb

## 2017-07-01 DIAGNOSIS — Z5111 Encounter for antineoplastic chemotherapy: Secondary | ICD-10-CM

## 2017-07-01 DIAGNOSIS — C7931 Secondary malignant neoplasm of brain: Secondary | ICD-10-CM

## 2017-07-01 DIAGNOSIS — C3411 Malignant neoplasm of upper lobe, right bronchus or lung: Secondary | ICD-10-CM

## 2017-07-01 DIAGNOSIS — M7989 Other specified soft tissue disorders: Secondary | ICD-10-CM

## 2017-07-01 DIAGNOSIS — C3490 Malignant neoplasm of unspecified part of unspecified bronchus or lung: Secondary | ICD-10-CM

## 2017-07-01 DIAGNOSIS — Z95828 Presence of other vascular implants and grafts: Secondary | ICD-10-CM

## 2017-07-01 LAB — COMPREHENSIVE METABOLIC PANEL
ALK PHOS: 72 U/L (ref 38–126)
ALT: 9 U/L (ref 0–44)
AST: 22 U/L (ref 15–41)
Albumin: 3.5 g/dL (ref 3.5–5.0)
Anion gap: 12 (ref 5–15)
BILIRUBIN TOTAL: 0.5 mg/dL (ref 0.3–1.2)
BUN: 15 mg/dL (ref 8–23)
CALCIUM: 9.6 mg/dL (ref 8.9–10.3)
CO2: 26 mmol/L (ref 22–32)
CREATININE: 0.9 mg/dL (ref 0.44–1.00)
Chloride: 103 mmol/L (ref 98–111)
Glucose, Bld: 160 mg/dL — ABNORMAL HIGH (ref 70–99)
Potassium: 3.8 mmol/L (ref 3.5–5.1)
Sodium: 141 mmol/L (ref 135–145)
TOTAL PROTEIN: 7.4 g/dL (ref 6.5–8.1)

## 2017-07-01 LAB — CBC WITH DIFFERENTIAL/PLATELET
Basophils Absolute: 0 10*3/uL (ref 0.0–0.1)
Basophils Relative: 0 %
EOS ABS: 0 10*3/uL (ref 0.0–0.5)
EOS PCT: 0 %
HCT: 35.2 % (ref 34.8–46.6)
Hemoglobin: 11.9 g/dL (ref 11.6–15.9)
LYMPHS ABS: 0.8 10*3/uL — AB (ref 0.9–3.3)
Lymphocytes Relative: 9 %
MCH: 31.4 pg (ref 25.1–34.0)
MCHC: 33.7 g/dL (ref 31.5–36.0)
MCV: 93.1 fL (ref 79.5–101.0)
Monocytes Absolute: 0.5 10*3/uL (ref 0.1–0.9)
Monocytes Relative: 5 %
Neutro Abs: 7.6 10*3/uL — ABNORMAL HIGH (ref 1.5–6.5)
Neutrophils Relative %: 86 %
PLATELETS: 329 10*3/uL (ref 145–400)
RBC: 3.78 MIL/uL (ref 3.70–5.45)
RDW: 17.4 % — ABNORMAL HIGH (ref 11.2–14.5)
WBC: 8.9 10*3/uL (ref 3.9–10.3)

## 2017-07-01 MED ORDER — SODIUM CHLORIDE 0.9 % IV SOLN
500.0000 mg/m2 | Freq: Once | INTRAVENOUS | Status: AC
  Administered 2017-07-01: 800 mg via INTRAVENOUS
  Filled 2017-07-01: qty 20

## 2017-07-01 MED ORDER — ONDANSETRON HCL 8 MG PO TABS
ORAL_TABLET | ORAL | Status: AC
  Filled 2017-07-01: qty 1

## 2017-07-01 MED ORDER — CYANOCOBALAMIN 1000 MCG/ML IJ SOLN
1000.0000 ug | Freq: Once | INTRAMUSCULAR | Status: AC
  Administered 2017-07-01: 1000 ug via INTRAMUSCULAR

## 2017-07-01 MED ORDER — SODIUM CHLORIDE 0.9 % IV SOLN
Freq: Once | INTRAVENOUS | Status: AC
  Administered 2017-07-01: 10:00:00 via INTRAVENOUS

## 2017-07-01 MED ORDER — SODIUM CHLORIDE 0.9% FLUSH
10.0000 mL | Freq: Once | INTRAVENOUS | Status: AC
  Administered 2017-07-01: 10 mL
  Filled 2017-07-01: qty 10

## 2017-07-01 MED ORDER — DEXAMETHASONE SODIUM PHOSPHATE 10 MG/ML IJ SOLN
INTRAMUSCULAR | Status: AC
  Filled 2017-07-01: qty 1

## 2017-07-01 MED ORDER — DEXAMETHASONE SODIUM PHOSPHATE 10 MG/ML IJ SOLN
10.0000 mg | Freq: Once | INTRAMUSCULAR | Status: AC
  Administered 2017-07-01: 10 mg via INTRAVENOUS

## 2017-07-01 MED ORDER — HEPARIN SOD (PORK) LOCK FLUSH 100 UNIT/ML IV SOLN
500.0000 [IU] | Freq: Once | INTRAVENOUS | Status: AC | PRN
  Administered 2017-07-01: 500 [IU]
  Filled 2017-07-01: qty 5

## 2017-07-01 MED ORDER — CYANOCOBALAMIN 1000 MCG/ML IJ SOLN
INTRAMUSCULAR | Status: AC
  Filled 2017-07-01: qty 1

## 2017-07-01 MED ORDER — ONDANSETRON HCL 8 MG PO TABS
8.0000 mg | ORAL_TABLET | Freq: Once | ORAL | Status: AC
  Administered 2017-07-01: 8 mg via ORAL

## 2017-07-01 MED ORDER — SODIUM CHLORIDE 0.9 % IJ SOLN
10.0000 mL | INTRAMUSCULAR | Status: DC | PRN
  Administered 2017-07-01: 10 mL
  Filled 2017-07-01: qty 10

## 2017-07-01 NOTE — Telephone Encounter (Signed)
Appointments scheduled AVS/Calendar printed per 625 los

## 2017-07-01 NOTE — Progress Notes (Signed)
[place] Telephone:(336) [phone]   Fax:([phone]  OFFICE PROGRESS NOTE  [name], MD [place] [address]  DIAGNOSIS: Metastatic non-small cell lung cancer initially diagnosed as locally advanced stage IIIB with a right Pancoast tumor involving the vertebral body as well as prominent canal invasion with spinal cord compression in October 2007. The patient also has metastatic disease to the brain in April 2008.  PRIOR THERAPY: 1. Status post concurrent chemoradiation with weekly carboplatin and paclitaxel, last dose was given November 18, 2005. 2. 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 3. Status post gamma knife radiotherapy to a solitary brain lesion located in the superior frontal area of the brain at [facility] in April of 2008. 4. Status post palliative radiotherapy to the lateral abdominal wall metastatic lesion under the care of Dr. Lisbeth Renshaw, completed March of 2009. 5. Status post 6 cycles of systemic chemotherapy with carboplatin and Alimta. Last dose was given July 26, 2007 with disease stabilization. 6. Gamma knife stereotactic radiotherapy to 2 brain lesions one involving the right frontal dural based as well as right parietal lesion performed on 05/07/2012 under the care of Dr. Vallarie Mare at [facility].  CURRENT THERAPY: Maintenance systemic chemotherapy with Alimta 500 MG/M2 every 3 weeks, status post 170 cycles.  INTERVAL HISTORY: Danielle [name] 67 y.o. female returns to the clinic today for follow-up visit accompanied by her sister and daughter.  The patient has no complaints today.  She denied having any chest pain, shortness breath, cough or hemoptysis.  She denied having any nausea, vomiting, diarrhea or constipation.  She has no recent weight loss or night sweats.  She denied having any headache or visual changes.  She is here  today for evaluation before starting cycle #171.  MEDICAL HISTORY: Past Medical History:  Diagnosis Date  . Anemia   . 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 CVA (cerebral vascular accident) ([place]) 04/2014   pt states had 2 cva's within 2 wks  . DJD (degenerative joint disease), cervical   . Fibromyalgia   . H/O: pneumonia   . History of tobacco abuse quit 10/08   on nicotine patch  . Hypertension   . Hypokalemia   . Lung cancer ([place]) dx'd 09/2005   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 had chemo and radiation for lung ca  . Thrush 12/11/2010    ALLERGIES:  is allergic to contrast media [iodinated diagnostic agents]; iohexol; sulfa drugs cross reactors; and co-trimoxazole injection [sulfamethoxazole-trimethoprim].  MEDICATIONS:  Current Outpatient Medications  Medication Sig Dispense Refill  . amLODipine (NORVASC) 10 MG tablet TAKE 1 TABLET (10 MG TOTAL) BY MOUTH DAILY. 90 tablet 1  . aspirin 325 MG EC tablet TAKE 1 TABLET BY MOUTH EVERY DAY 90 tablet 0  . atorvastatin (LIPITOR) 40 MG tablet TAKE 1 TABLET BY MOUTH EVERY DAY AT 6 PM 90 tablet 3  . diphenhydrAMINE (BENADRYL) 25 MG tablet Take 1 tablet (25 mg total) by mouth once. 2 hours before CT scan on 09/18/2015 10 tablet 0  . FeFum-FePoly-FA-B Cmp-C-Biot (INTEGRA PLUS) CAPS Take 1 capsule by mouth every morning. 90 capsule 2  . fluticasone (FLONASE) 50 MCG/ACT nasal spray Place 1-2 sprays into both nostrils daily as needed. 16 g 5  . folic acid (FOLVITE) 1 MG tablet TAKE 1 TABLET BY MOUTH EVERY DAY 90 tablet 1  . furosemide (LASIX) 20 MG  tablet TAKE 1 TABLET (20 MG TOTAL) BY MOUTH DAILY. AS NEEDED FOR SWELLING. 20 tablet 0  . lacosamide (VIMPAT) 50 MG TABS tablet Take by mouth.    . lidocaine-prilocaine (EMLA) cream Apply to Port-A-Cath as directed 1-2 hours prior to chemotherapy 30 g 1  . lisinopril (PRINIVIL,ZESTRIL) 20 MG tablet TAKE 2 TABLETS (40 MG TOTAL) BY MOUTH DAILY. 180 tablet 1  . morphine (MS  CONTIN) 60 MG 12 hr tablet Take 1 tablet (60 mg total) by mouth 2 (two) times daily. 60 tablet 0  . morphine (MSIR) 30 MG tablet Take 1 tablet (30 mg total) by mouth every 4 (four) hours as needed (breakthrough pain). 60 tablet 0  . Olopatadine HCl 0.2 % SOLN Apply 1 drop to eye daily. 2.5 mL 1  . ondansetron (ZOFRAN) 8 MG tablet TAKE 1 TABLET (8 MG TOTAL) BY MOUTH EVERY 8 (EIGHT) HOURS AS NEEDED FOR NAUSEA OR VOMITING. 30 tablet 0  . predniSONE (DELTASONE) 50 MG tablet 1 tablet by mouth 13 hours ,7 hours and 1 hour  before CT scan (Patient not taking: Reported on 04/08/2017) 3 tablet 3  . prochlorperazine (COMPAZINE) 10 MG tablet Take 10 mg by mouth.    . triamcinolone (KENALOG) 0.025 % ointment Apply 1 application topically 2 (two) times daily. 30 g 0   No current facility-administered medications for this visit.     SURGICAL HISTORY:  Past Surgical History:  Procedure Laterality Date  . CERVICAL LAMINECTOMY  1995  . KNEE SURGERY  1990   Left x 2  . 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 LOOP RECORDER IMPLANT N/A 04/19/2014   Procedure: LOOP RECORDER IMPLANT;  Surgeon: Thompson Grayer, MD;  Location: [facility] CATH LAB;  Service: Cardiovascular;  Laterality: N/A;  . OTHER SURGICAL HISTORY  2008   Gamma knife surgery to remove brain met   . PORTACATH PLACEMENT  -[name]   TIP IN CAVOATRIAL JUNCTION  . TEE WITHOUT CARDIOVERSION N/A 04/19/2014   Procedure: TRANSESOPHAGEAL ECHOCARDIOGRAM (TEE);  Surgeon: Larey Dresser, MD;  Location: [facility] ENDOSCOPY;  Service: Cardiovascular;  Laterality: N/A;    REVIEW OF SYSTEMS:  A comprehensive review of systems was negative.   PHYSICAL EXAMINATION: General appearance: alert, cooperative and no distress Head: Normocephalic, without obvious abnormality, atraumatic Neck: no adenopathy, no JVD, supple, symmetrical, trachea midline and thyroid not enlarged, symmetric, no tenderness/mass/nodules Lymph nodes: Cervical, supraclavicular,  and axillary nodes normal. Resp: clear to auscultation bilaterally Back: symmetric, no curvature. ROM normal. No CVA tenderness. Cardio: regular rate and rhythm, S1, S2 normal, no murmur, click, rub or gallop GI: soft, non-tender; bowel sounds normal; no masses,  no organomegaly Extremities: extremities normal, atraumatic, no cyanosis or edema  ECOG PERFORMANCE STATUS: 0 - Asymptomatic  Blood pressure 115/64, pulse 99, temperature 97.8 F (36.6 C), temperature source Oral, resp. rate 17, height '5\' 3"'  (1.6 m), weight 108 lb 12.8 oz (49.4 kg), SpO2 100 %.  LABORATORY DATA: Lab Results  Component Value Date   WBC 10.7 (H) 06/09/2017   HGB 10.6 (L) 06/09/2017   HCT 31.5 (L) 06/09/2017   MCV 91.8 06/09/2017   PLT 592 (H) 06/09/2017      Chemistry      Component Value Date/Time   NA 140 06/09/2017 0903   NA 142 12/24/2016 0816   K 3.9 06/09/2017 0903   K 4.4 12/24/2016 0816   CL 106 06/09/2017 0903   CL 106 06/30/2012 1004  CO2 26 06/09/2017 0903   CO2 28 12/24/2016 0816   BUN 15 06/09/2017 0903   BUN 15.8 12/24/2016 0816   CREATININE 0.84 06/09/2017 0903   CREATININE 1.1 12/24/2016 0816      Component Value Date/Time   CALCIUM 9.2 06/09/2017 0903   CALCIUM 9.6 12/24/2016 0816   ALKPHOS 70 06/09/2017 0903   ALKPHOS 80 12/24/2016 0816   AST 18 06/09/2017 0903   AST 20 12/24/2016 0816   ALT 9 06/09/2017 0903   ALT 11 12/24/2016 0816   BILITOT 0.4 06/09/2017 0903   BILITOT 0.34 12/24/2016 0816       RADIOGRAPHIC STUDIES: No results found.  ASSESSMENT AND PLAN: This is a very pleasant 67 years old white female with metastatic non-small cell lung cancer diagnosed in October 2007 status post concurrent chemoradiation followed by consolidation chemotherapy and she is currently on maintenance treatment with single agent Alimta status post 170 cycles. The patient continues to tolerate her treatment well with no concerning complaints. I recommended for her to proceed with  cycle #171 today. I will see her back for follow-up visit in 3 weeks for evaluation before starting cycle #172. For the swelling of the lower extremities, she will continue Lasix. She was advised to call immediately if she has any concerning symptoms in the interval. The patient voices understanding of current disease status and treatment options and is in agreement with the current care plan. All questions were answered. The patient knows to call the clinic with any problems, questions or concerns. We can certainly see the patient much sooner if necessary.  Disclaimer: This note was dictated with voice recognition software. Similar sounding words can inadvertently be transcribed and may not be corrected upon review.

## 2017-07-01 NOTE — Patient Instructions (Signed)
[place] Discharge Instructions for Patients Receiving Chemotherapy  Today you received the following chemotherapy agents:  Alimta (pemetrexed)  To help prevent nausea and vomiting after your treatment, we encourage you to take your nausea medicine as prescribed.   If you develop nausea and vomiting that is not controlled by your nausea medication, call the clinic.   BELOW ARE SYMPTOMS THAT SHOULD BE REPORTED IMMEDIATELY:  *FEVER GREATER THAN 100.5 F  *CHILLS WITH OR WITHOUT FEVER  NAUSEA AND VOMITING THAT IS NOT CONTROLLED WITH YOUR NAUSEA MEDICATION  *UNUSUAL SHORTNESS OF BREATH  *UNUSUAL BRUISING OR BLEEDING  TENDERNESS IN MOUTH AND THROAT WITH OR WITHOUT PRESENCE OF ULCERS  *URINARY PROBLEMS  *BOWEL PROBLEMS  UNUSUAL RASH Items with * indicate a potential emergency and should be followed up as soon as possible.  Feel free to call the clinic should you have any questions or concerns. The clinic phone number is [phone].  Please show the [name] at check-in to the Emergency Department and triage nurse.

## 2017-07-07 ENCOUNTER — Other Ambulatory Visit: Payer: Self-pay | Admitting: Internal Medicine

## 2017-07-07 DIAGNOSIS — C7931 Secondary malignant neoplasm of brain: Secondary | ICD-10-CM

## 2017-07-17 ENCOUNTER — Other Ambulatory Visit: Payer: Self-pay | Admitting: Medical Oncology

## 2017-07-17 DIAGNOSIS — C3410 Malignant neoplasm of upper lobe, unspecified bronchus or lung: Secondary | ICD-10-CM

## 2017-07-17 DIAGNOSIS — C3490 Malignant neoplasm of unspecified part of unspecified bronchus or lung: Secondary | ICD-10-CM

## 2017-07-17 MED ORDER — MORPHINE SULFATE 30 MG PO TABS: 30.0000 mg | ORAL_TABLET | ORAL | 0 refills | Status: DC | PRN

## 2017-07-17 MED ORDER — MORPHINE SULFATE ER 60 MG PO TBCR: 60.0000 mg | EXTENDED_RELEASE_TABLET | Freq: Two times a day (BID) | ORAL | 0 refills | Status: DC

## 2017-07-21 DIAGNOSIS — C7931 Secondary malignant neoplasm of brain: Secondary | ICD-10-CM | POA: Diagnosis not present

## 2017-07-21 DIAGNOSIS — Z08 Encounter for follow-up examination after completed treatment for malignant neoplasm: Secondary | ICD-10-CM | POA: Diagnosis not present

## 2017-07-21 DIAGNOSIS — Z85118 Personal history of other malignant neoplasm of bronchus and lung: Secondary | ICD-10-CM | POA: Diagnosis not present

## 2017-07-22 ENCOUNTER — Inpatient Hospital Stay: Payer: Medicare Other | Attending: Internal Medicine

## 2017-07-22 ENCOUNTER — Telehealth: Payer: Self-pay | Admitting: Internal Medicine

## 2017-07-22 ENCOUNTER — Encounter: Payer: Self-pay | Admitting: Internal Medicine

## 2017-07-22 ENCOUNTER — Inpatient Hospital Stay: Payer: Medicare Other

## 2017-07-22 ENCOUNTER — Inpatient Hospital Stay (HOSPITAL_BASED_OUTPATIENT_CLINIC_OR_DEPARTMENT_OTHER): Payer: Medicare Other | Admitting: Internal Medicine

## 2017-07-22 VITALS — BP 116/63 | HR 70 | Temp 97.9°F | Resp 17 | Ht 63.0 in | Wt 114.7 lb

## 2017-07-22 DIAGNOSIS — I10 Essential (primary) hypertension: Secondary | ICD-10-CM

## 2017-07-22 DIAGNOSIS — C3490 Malignant neoplasm of unspecified part of unspecified bronchus or lung: Secondary | ICD-10-CM

## 2017-07-22 DIAGNOSIS — C3411 Malignant neoplasm of upper lobe, right bronchus or lung: Secondary | ICD-10-CM

## 2017-07-22 DIAGNOSIS — D6481 Anemia due to antineoplastic chemotherapy: Secondary | ICD-10-CM

## 2017-07-22 DIAGNOSIS — Z5111 Encounter for antineoplastic chemotherapy: Secondary | ICD-10-CM

## 2017-07-22 DIAGNOSIS — C7931 Secondary malignant neoplasm of brain: Secondary | ICD-10-CM

## 2017-07-22 DIAGNOSIS — T451X5A Adverse effect of antineoplastic and immunosuppressive drugs, initial encounter: Secondary | ICD-10-CM

## 2017-07-22 DIAGNOSIS — Z95828 Presence of other vascular implants and grafts: Secondary | ICD-10-CM

## 2017-07-22 LAB — CMP (CANCER CENTER ONLY)
ALK PHOS: 65 U/L (ref 38–126)
ALT: 11 U/L (ref 0–44)
ANION GAP: 7 (ref 5–15)
AST: 20 U/L (ref 15–41)
Albumin: 3.2 g/dL — ABNORMAL LOW (ref 3.5–5.0)
BILIRUBIN TOTAL: 0.4 mg/dL (ref 0.3–1.2)
BUN: 16 mg/dL (ref 8–23)
CALCIUM: 9.5 mg/dL (ref 8.9–10.3)
CO2: 27 mmol/L (ref 22–32)
CREATININE: 0.87 mg/dL (ref 0.44–1.00)
Chloride: 105 mmol/L (ref 98–111)
GFR, Estimated: >60 mL/min (ref >60)
Glucose, Bld: 201 mg/dL — ABNORMAL HIGH (ref 70–99)
Potassium: 4.7 mmol/L (ref 3.5–5.1)
Sodium: 139 mmol/L (ref 135–145)
TOTAL PROTEIN: 6.5 g/dL (ref 6.5–8.1)

## 2017-07-22 LAB — CBC WITH DIFFERENTIAL/PLATELET
BASOS PCT: 0 %
Basophils Absolute: 0 10*3/uL (ref 0.0–0.1)
EOS ABS: 0 10*3/uL (ref 0.0–0.5)
Eosinophils Relative: 1 %
HEMATOCRIT: 31.3 % — AB (ref 34.8–46.6)
Hemoglobin: 10.4 g/dL — ABNORMAL LOW (ref 11.6–15.9)
Lymphocytes Relative: 12 %
Lymphs Abs: 1 10*3/uL (ref 0.9–3.3)
MCH: 31.4 pg (ref 25.1–34.0)
MCHC: 33 g/dL (ref 31.5–36.0)
MCV: 95.1 fL (ref 79.5–101.0)
MONO ABS: 0.8 10*3/uL (ref 0.1–0.9)
MONOS PCT: 9 %
Neutro Abs: 7 10*3/uL — ABNORMAL HIGH (ref 1.5–6.5)
Neutrophils Relative %: 78 %
Platelets: 303 10*3/uL (ref 145–400)
RBC: 3.29 MIL/uL — ABNORMAL LOW (ref 3.70–5.45)
RDW: 17.5 % — ABNORMAL HIGH (ref 11.2–14.5)
WBC: 9 10*3/uL (ref 3.9–10.3)

## 2017-07-22 MED ORDER — ONDANSETRON HCL 8 MG PO TABS
8.0000 mg | ORAL_TABLET | Freq: Once | ORAL | Status: AC
  Administered 2017-07-22: 8 mg via ORAL

## 2017-07-22 MED ORDER — DEXAMETHASONE SODIUM PHOSPHATE 10 MG/ML IJ SOLN
10.0000 mg | Freq: Once | INTRAMUSCULAR | Status: AC
  Administered 2017-07-22: 10 mg via INTRAVENOUS

## 2017-07-22 MED ORDER — SODIUM CHLORIDE 0.9 % IJ SOLN
10.0000 mL | INTRAMUSCULAR | Status: DC | PRN
Start: 2017-07-22 — End: 2017-07-22
  Administered 2017-07-22: 10 mL
  Filled 2017-07-22: qty 10

## 2017-07-22 MED ORDER — PEMETREXED DISODIUM CHEMO INJECTION 500 MG
500.0000 mg/m2 | Freq: Once | INTRAVENOUS | Status: AC
  Administered 2017-07-22: 800 mg via INTRAVENOUS
  Filled 2017-07-22: qty 20

## 2017-07-22 MED ORDER — SODIUM CHLORIDE 0.9% FLUSH
10.0000 mL | Freq: Once | INTRAVENOUS | Status: AC
  Administered 2017-07-22: 10 mL
  Filled 2017-07-22: qty 10

## 2017-07-22 MED ORDER — HEPARIN SOD (PORK) LOCK FLUSH 100 UNIT/ML IV SOLN
500.0000 [IU] | Freq: Once | INTRAVENOUS | Status: AC | PRN
  Administered 2017-07-22: 500 [IU]
  Filled 2017-07-22: qty 5

## 2017-07-22 MED ORDER — DEXAMETHASONE SODIUM PHOSPHATE 10 MG/ML IJ SOLN
INTRAMUSCULAR | Status: AC
  Filled 2017-07-22: qty 1

## 2017-07-22 MED ORDER — ONDANSETRON HCL 8 MG PO TABS
ORAL_TABLET | ORAL | Status: AC
  Filled 2017-07-22: qty 1

## 2017-07-22 MED ORDER — SODIUM CHLORIDE 0.9 % IV SOLN: Freq: Once | INTRAVENOUS | Status: DC

## 2017-07-22 NOTE — Patient Instructions (Signed)
[place] [facility] Discharge Instructions for Patients Receiving Chemotherapy  Today you received the following chemotherapy agents: Alimta  To help prevent nausea and vomiting after your treatment, we encourage you to take your nausea medication as directed.   If you develop nausea and vomiting that is not controlled by your nausea medication, call the clinic.   BELOW ARE SYMPTOMS THAT SHOULD BE REPORTED IMMEDIATELY:  *FEVER GREATER THAN 100.5 F  *CHILLS WITH OR WITHOUT FEVER  NAUSEA AND VOMITING THAT IS NOT CONTROLLED WITH YOUR NAUSEA MEDICATION  *UNUSUAL SHORTNESS OF BREATH  *UNUSUAL BRUISING OR BLEEDING  TENDERNESS IN MOUTH AND THROAT WITH OR WITHOUT PRESENCE OF ULCERS  *URINARY PROBLEMS  *BOWEL PROBLEMS  UNUSUAL RASH Items with * indicate a potential emergency and should be followed up as soon as possible.  Feel free to call the clinic should you have any questions or concerns. The clinic phone number is [phone].  Please show the [facility] at check-in to the Emergency Department and triage nurse.

## 2017-07-22 NOTE — Progress Notes (Signed)
[place] [phone]   Fax:(336) [phone]  OFFICE PROGRESS NOTE  Danielle Sanders, MD [place] [address]  DIAGNOSIS: Metastatic non-small cell lung cancer initially diagnosed as locally advanced stage IIIB with a right Pancoast tumor involving the vertebral body as well as prominent canal invasion with spinal cord compression in October 2007. The patient also has metastatic disease to the brain in April 2008.  PRIOR THERAPY: 1. Status post concurrent chemoradiation with weekly carboplatin and paclitaxel, last dose was given November 18, 2005. 2. 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 3. Status post gamma knife radiotherapy to a solitary brain lesion located in the superior frontal area of the brain at [facility] in April of 2008. 4. Status post palliative radiotherapy to the lateral abdominal wall metastatic lesion under the care of Dr. Lisbeth Renshaw, completed March of 2009. 5. Status post 6 cycles of systemic chemotherapy with carboplatin and Alimta. Last dose was given July 26, 2007 with disease stabilization. 6. Gamma knife stereotactic radiotherapy to 2 brain lesions one involving the right frontal dural based as well as right parietal lesion performed on 05/07/2012 under the care of Dr. Vallarie Mare at [facility].  CURRENT THERAPY: Maintenance systemic chemotherapy with Alimta 500 MG/M2 every 3 weeks, status post 171 cycles.  INTERVAL HISTORY: Danielle [name] 67 y.o. female returns to the clinic today for follow-up visit accompanied by her daughter.  The patient is feeling fine today with no concerning complaints.  She denied having any chest pain, shortness of breath, cough or hemoptysis.  She denied having any fever or chills.  She has no nausea, vomiting, diarrhea or constipation.  She was seen recently at [facility] by Dr. Vallarie Mare and  repeat MRI of the brain showed no concerning findings for progression.  The patient continues to tolerate her maintenance treatment with Alimta fairly well.  She is here for evaluation before starting cycle #172.  MEDICAL HISTORY: Past Medical History:  Diagnosis Date  . Anemia   . 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 CVA (cerebral vascular accident) ([place]) 04/2014   pt states had 2 cva's within 2 wks  . DJD (degenerative joint disease), cervical   . Fibromyalgia   . H/O: pneumonia   . History of tobacco abuse quit 10/08   on nicotine patch  . Hypertension   . Hypokalemia   . Lung cancer ([place]) dx'd 09/2005   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 had chemo and radiation for lung ca  . Thrush 12/11/2010    ALLERGIES:  is allergic to contrast media [iodinated diagnostic agents]; iohexol; sulfa drugs cross reactors; and co-trimoxazole injection [sulfamethoxazole-trimethoprim].  MEDICATIONS:  Current Outpatient Medications  Medication Sig Dispense Refill  . amLODipine (NORVASC) 10 MG tablet TAKE 1 TABLET (10 MG TOTAL) BY MOUTH DAILY. 90 tablet 1  . aspirin 325 MG EC tablet TAKE 1 TABLET BY MOUTH EVERY DAY 90 tablet 0  . atorvastatin (LIPITOR) 40 MG tablet TAKE 1 TABLET BY MOUTH EVERY DAY AT 6 PM 90 tablet 3  . diphenhydrAMINE (BENADRYL) 25 MG tablet Take 1 tablet (25 mg total) by mouth once. 2 hours before CT scan on 09/18/2015 10 tablet 0  . FeFum-FePoly-FA-B Cmp-C-Biot (INTEGRA PLUS) CAPS Take 1 capsule by mouth every morning. 90 capsule 2  . fluticasone (FLONASE) 50 MCG/ACT nasal spray Place 1-2 sprays into both nostrils daily as needed.  16 g 5  . folic acid (FOLVITE) 1 MG tablet TAKE 1 TABLET BY MOUTH EVERY DAY 90 tablet 1  . furosemide (LASIX) 20 MG tablet TAKE 1 TABLET (20 MG TOTAL) BY MOUTH DAILY. AS NEEDED FOR SWELLING. 20 tablet 0  . lacosamide (VIMPAT) 50 MG TABS tablet Take by mouth.    . lidocaine-prilocaine (EMLA) cream Apply to Port-A-Cath as directed  1-2 hours prior to chemotherapy 30 g 1  . lisinopril (PRINIVIL,ZESTRIL) 20 MG tablet TAKE 2 TABLETS (40 MG TOTAL) BY MOUTH DAILY. 180 tablet 1  . morphine (MS CONTIN) 60 MG 12 hr tablet Take 1 tablet (60 mg total) by mouth 2 (two) times daily. 60 tablet 0  . morphine (MSIR) 30 MG tablet Take 1 tablet (30 mg total) by mouth every 4 (four) hours as needed (breakthrough pain). 60 tablet 0  . Olopatadine HCl 0.2 % SOLN Apply 1 drop to eye daily. 2.5 mL 1  . ondansetron (ZOFRAN) 8 MG tablet TAKE 1 TABLET (8 MG TOTAL) BY MOUTH EVERY 8 (EIGHT) HOURS AS NEEDED FOR NAUSEA OR VOMITING. 30 tablet 0  . predniSONE (DELTASONE) 50 MG tablet 1 tablet by mouth 13 hours ,7 hours and 1 hour  before CT scan (Patient not taking: Reported on 04/08/2017) 3 tablet 3  . prochlorperazine (COMPAZINE) 10 MG tablet Take 10 mg by mouth.    . triamcinolone (KENALOG) 0.025 % ointment Apply 1 application topically 2 (two) times daily. 30 g 0   No current facility-administered medications for this visit.     SURGICAL HISTORY:  Past Surgical History:  Procedure Laterality Date  . CERVICAL LAMINECTOMY  1995  . KNEE SURGERY  1990   Left x 2  . 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 LOOP RECORDER IMPLANT N/A 04/19/2014   Procedure: LOOP RECORDER IMPLANT;  Surgeon: Thompson Grayer, MD;  Location: [facility] CATH LAB;  Service: Cardiovascular;  Laterality: N/A;  . OTHER SURGICAL HISTORY  2008   Gamma knife surgery to remove brain met   . PORTACATH PLACEMENT  -[name]   TIP IN CAVOATRIAL JUNCTION  . TEE WITHOUT CARDIOVERSION N/A 04/19/2014   Procedure: TRANSESOPHAGEAL ECHOCARDIOGRAM (TEE);  Surgeon: Larey Dresser, MD;  Location: [facility];  Service: Cardiovascular;  Laterality: N/A;    REVIEW OF SYSTEMS:  A comprehensive review of systems was negative except for: Constitutional: positive for fatigue   PHYSICAL EXAMINATION: General appearance: alert, cooperative and no distress Head: Normocephalic,  without obvious abnormality, atraumatic Neck: no adenopathy, no JVD, supple, symmetrical, trachea midline and thyroid not enlarged, symmetric, no tenderness/mass/nodules Lymph nodes: Cervical, supraclavicular, and axillary nodes normal. Resp: clear to auscultation bilaterally Back: symmetric, no curvature. ROM normal. No CVA tenderness. Cardio: regular rate and rhythm, S1, S2 normal, no murmur, click, rub or gallop GI: soft, non-tender; bowel sounds normal; no masses,  no organomegaly Extremities: extremities normal, atraumatic, no cyanosis or edema  ECOG PERFORMANCE STATUS: 1 - Symptomatic but completely ambulatory  Blood pressure 116/63, pulse 70, temperature 97.9 F (36.6 C), resp. rate 17, height _0  (1.6 m), weight 114 lb 11.2 oz (52 kg), SpO2 96 %.  LABORATORY DATA: Lab Results  Component Value Date   WBC 9.0 07/22/2017   HGB 10.4 (L) 07/22/2017   HCT 31.3 (L) 07/22/2017   MCV 95.1 07/22/2017   PLT 303 07/22/2017      Chemistry      Component Value Date/Time   NA 139 07/22/2017 0826  NA 142 12/24/2016 0816   K 4.7 07/22/2017 0826   K 4.4 12/24/2016 0816   CL 105 07/22/2017 0826   CL 106 06/30/2012 1004   CO2 27 07/22/2017 0826   CO2 28 12/24/2016 0816   BUN 16 07/22/2017 0826   BUN 15.8 12/24/2016 0816   CREATININE 0.87 07/22/2017 0826   CREATININE 1.1 12/24/2016 0816      Component Value Date/Time   CALCIUM 9.5 07/22/2017 0826   CALCIUM 9.6 12/24/2016 0816   ALKPHOS 65 07/22/2017 0826   ALKPHOS 80 12/24/2016 0816   AST 20 07/22/2017 0826   AST 20 12/24/2016 0816   ALT 11 07/22/2017 0826   ALT 11 12/24/2016 0816   BILITOT 0.4 07/22/2017 0826   BILITOT 0.34 12/24/2016 0816       RADIOGRAPHIC STUDIES: No results found.  ASSESSMENT AND PLAN: This is a very pleasant 67 years old white female with metastatic non-small cell lung cancer diagnosed in October 2007 status post concurrent chemoradiation followed by consolidation chemotherapy and she is  currently on maintenance treatment with single agent Alimta status post 171 cycles. The patient continues to tolerate her treatment well with no concerning complaints. I recommended for her to proceed with cycle 172 today as scheduled. She will come back for follow-up visit in 3 weeks for evaluation before starting cycle #173. The patient was advised to call immediately if she has any concerning symptoms in the interval. The patient voices understanding of current disease status and treatment options and is in agreement with the current care plan. All questions were answered. The patient knows to call the clinic with any problems, questions or concerns. We can certainly see the patient much sooner if necessary.  Disclaimer: This note was dictated with voice recognition software. Similar sounding words can inadvertently be transcribed and may not be corrected upon review.

## 2017-07-22 NOTE — Patient Instructions (Signed)
Implanted Port Home Guide An implanted port is a type of central line that is placed under the skin. Central lines are used to provide IV access when treatment or nutrition needs to be given through a person's veins. Implanted ports are used for long-term IV access. An implanted port may be placed because:  You need IV medicine that would be irritating to the small veins in your hands or arms.  You need long-term IV medicines, such as antibiotics.  You need IV nutrition for a long period.  You need frequent blood draws for lab tests.  You need dialysis.  Implanted ports are usually placed in the chest area, but they can also be placed in the upper arm, the abdomen, or the leg. An implanted port has two main parts:  Reservoir. The reservoir is round and will appear as a small, raised area under your skin. The reservoir is the part where a needle is inserted to give medicines or draw blood.  Catheter. The catheter is a thin, flexible tube that extends from the reservoir. The catheter is placed into a large vein. Medicine that is inserted into the reservoir goes into the catheter and then into the vein.  How will I care for my incision site? Do not get the incision site wet. Bathe or shower as directed by your health care provider. How is my port accessed? Special steps must be taken to access the port:  Before the port is accessed, a numbing cream can be placed on the skin. This helps numb the skin over the port site.  Your health care provider uses a sterile technique to access the port. ? Your health care provider must put on a mask and sterile gloves. ? The skin over your port is cleaned carefully with an antiseptic and allowed to dry. ? The port is gently pinched between sterile gloves, and a needle is inserted into the port.  Only "non-coring" port needles should be used to access the port. Once the port is accessed, a blood return should be checked. This helps ensure that the port  is in the vein and is not clogged.  If your port needs to remain accessed for a constant infusion, a clear (transparent) bandage will be placed over the needle site. The bandage and needle will need to be changed every week, or as directed by your health care provider.  Keep the bandage covering the needle clean and dry. Do not get it wet. Follow your health care provider's instructions on how to take a shower or bath while the port is accessed.  If your port does not need to stay accessed, no bandage is needed over the port.  What is flushing? Flushing helps keep the port from getting clogged. Follow your health care provider's instructions on how and when to flush the port. Ports are usually flushed with saline solution or a medicine called heparin. The need for flushing will depend on how the port is used.  If the port is used for intermittent medicines or blood draws, the port will need to be flushed: ? After medicines have been given. ? After blood has been drawn. ? As part of routine maintenance.  If a constant infusion is running, the port may not need to be flushed.  How long will my port stay implanted? The port can stay in for as long as your health care provider thinks it is needed. When it is time for the port to come out, surgery will be   done to remove it. The procedure is similar to the one performed when the port was put in. When should I seek immediate medical care? When you have an implanted port, you should seek immediate medical care if:  You notice a bad smell coming from the incision site.  You have swelling, redness, or drainage at the incision site.  You have more swelling or pain at the port site or the surrounding area.  You have a fever that is not controlled with medicine.  This information is not intended to replace advice given to you by your health care provider. Make sure you discuss any questions you have with your health care provider. Document  Released: 12/24/2004 Document Revised: 06/01/2015 Document Reviewed: 08/31/2012 Elsevier Interactive Patient Education  2017 [employer]  

## 2017-07-22 NOTE — Telephone Encounter (Signed)
per 7/16 los - appts already schedule - no appts added.

## 2017-07-26 ENCOUNTER — Other Ambulatory Visit: Payer: Self-pay | Admitting: Internal Medicine

## 2017-08-04 ENCOUNTER — Telehealth: Payer: Self-pay

## 2017-08-04 DIAGNOSIS — E785 Hyperlipidemia, unspecified: Secondary | ICD-10-CM

## 2017-08-04 DIAGNOSIS — E119 Type 2 diabetes mellitus without complications: Secondary | ICD-10-CM

## 2017-08-04 NOTE — Telephone Encounter (Signed)
CPE labs ordered. Forwarding to PCP for approval.

## 2017-08-05 ENCOUNTER — Ambulatory Visit (INDEPENDENT_AMBULATORY_CARE_PROVIDER_SITE_OTHER): Payer: Medicare Other

## 2017-08-05 ENCOUNTER — Ambulatory Visit: Payer: Medicare Other

## 2017-08-05 VITALS — BP 110/60 | HR 81 | Temp 97.9°F | Ht 62.5 in | Wt 106.5 lb

## 2017-08-05 DIAGNOSIS — E119 Type 2 diabetes mellitus without complications: Secondary | ICD-10-CM

## 2017-08-05 DIAGNOSIS — Z0000 Encounter for general adult medical examination without abnormal findings: Secondary | ICD-10-CM | POA: Diagnosis not present

## 2017-08-05 DIAGNOSIS — E785 Hyperlipidemia, unspecified: Secondary | ICD-10-CM

## 2017-08-05 LAB — LIPID PANEL
Cholesterol: 112 mg/dL (ref 0–200)
HDL: 38.4 mg/dL — ABNORMAL LOW (ref >39.00)
LDL Cholesterol: 54 mg/dL (ref 0–99)
NonHDL: 73.62
Total CHOL/HDL Ratio: 3
Triglycerides: 97 mg/dL (ref 0.0–149.0)
VLDL: 19.4 mg/dL (ref 0.0–40.0)

## 2017-08-05 LAB — HEMOGLOBIN A1C: Hgb A1c MFr Bld: 5.5 % (ref 4.6–6.5)

## 2017-08-05 NOTE — Telephone Encounter (Signed)
Agree with labs ordered.

## 2017-08-05 NOTE — Patient Instructions (Signed)
Ms. Zeleznik , Thank you for taking time to come for your Medicare Wellness Visit. I appreciate your ongoing commitment to your health goals. Please review the following plan we discussed and let me know if I can assist you in the future.   These are the goals we discussed: Goals    . Patient Stated     Starting 08/05/2017, I will continue to take medications as prescribed.        This is a list of the screening recommended for you and due dates:  Health Maintenance  Topic Date Due  . Eye exam for diabetics  05/22/2018*  . Pneumonia vaccines (1 of 2 - PCV13) 08/07/2018*  . DEXA scan (bone density measurement)  01/07/2019*  . Mammogram  07/27/2023*  . Pap Smear  07/27/2023*  . Flu Shot  08/07/2017  . Complete foot exam   08/09/2017  . Tetanus Vaccine  01/07/2018  . Hemoglobin A1C  02/05/2018  . Colon Cancer Screening  06/21/2018  .  Hepatitis C: One time screening is recommended by Center for Disease Control  (CDC) for  adults born from [birth year] through 1963.   Completed  . HIV Screening  Completed  *Topic was postponed. The date shown is not the original due date.   Preventive Care for Adults  A healthy lifestyle and preventive care can promote health and wellness. Preventive health guidelines for adults include the following key practices.  . A routine yearly physical is a good way to check with your health care provider about your health and preventive screening. It is a chance to share any concerns and updates on your health and to receive a thorough exam.  . Visit your dentist for a routine exam and preventive care every 6 months. Brush your teeth twice a day and floss once a day. Good oral hygiene prevents tooth decay and gum disease.  . The frequency of eye exams is based on your age, health, family medical history, use  of contact lenses, and other factors. Follow your health care provider's recommendations for frequency of eye exams.  . Eat a healthy diet. Foods like  vegetables, fruits, whole grains, low-fat dairy products, and lean protein foods contain the nutrients you need without too many calories. Decrease your intake of foods high in solid fats, added sugars, and salt. Eat the right amount of calories for you. Get information about a proper diet from your health care provider, if necessary.  . Regular physical exercise is one of the most important things you can do for your health. Most adults should get at least 150 minutes of moderate-intensity exercise (any activity that increases your heart rate and causes you to sweat) each week. In addition, most adults need muscle-strengthening exercises on 2 or more days a week.  Silver Sneakers may be a benefit available to you. To determine eligibility, you may visit the website: www.silversneakers.com or contact program at 564-246-7234 Mon-Fri between 8AM-8PM.   . Maintain a healthy weight. The body mass index (BMI) is a screening tool to identify possible weight problems. It provides an estimate of body fat based on height and weight. Your health care provider can find your BMI and can help you achieve or maintain a healthy weight.   For adults 20 years and older: ? A BMI below 18.5 is considered underweight. ? A BMI of 18.5 to 24.9 is normal. ? A BMI of 25 to 29.9 is considered overweight. ? A BMI of 30 and above is considered obese.   Marland Kitchen  Maintain normal blood lipids and cholesterol levels by exercising and minimizing your intake of saturated fat. Eat a balanced diet with plenty of fruit and vegetables. Blood tests for lipids and cholesterol should begin at age 10 and be repeated every 5 years. If your lipid or cholesterol levels are high, you are over 50, or you are at high risk for heart disease, you may need your cholesterol levels checked more frequently. Ongoing high lipid and cholesterol levels should be treated with medicines if diet and exercise are not working.  . If you smoke, find out from your  health care provider how to quit. If you do not use tobacco, please do not start.  . If you choose to drink alcohol, please do not consume more than 2 drinks per day. One drink is considered to be 12 ounces (355 mL) of beer, 5 ounces (148 mL) of wine, or 1.5 ounces (44 mL) of liquor.  . If you are 62-62 years old, ask your health care provider if you should take aspirin to prevent strokes.  . Use sunscreen. Apply sunscreen liberally and repeatedly throughout the day. You should seek shade when your shadow is shorter than you. Protect yourself by wearing long sleeves, pants, a wide-brimmed hat, and sunglasses year round, whenever you are outdoors.  . Once a month, do a whole body skin exam, using a mirror to look at the skin on your back. Tell your health care provider of new moles, moles that have irregular borders, moles that are larger than a pencil eraser, or moles that have changed in shape or color.

## 2017-08-05 NOTE — Progress Notes (Signed)
Subjective:   Danielle [name] is a 67 y.o. female who presents for Medicare Annual (Subsequent) preventive examination.  Review of Systems:  N/A Cardiac Risk Factors include: advanced age (>82mn, >>31women);dyslipidemia;diabetes mellitus;hypertension     Objective:     Vitals: BP 110/60 (BP Location: Left Arm, Patient Position: Sitting, Cuff Size: Normal)   Pulse 81   Temp 97.9 F (36.6 C) (Oral)   Ht 5' 2.5" (1.588 m) Comment: no shoes  Wt 106 lb 8 oz (48.3 kg)   SpO2 97%   BMI 19.17 kg/m   Body mass index is 19.17 kg/m.  Advanced Directives 08/05/2017 07/22/2017 07/01/2017 04/29/2017 04/08/2017 02/25/2017 02/04/2017  Does Patient Have a Medical Advance Directive? _0  No No  Would patient like information on creating a medical advance directive? Yes (MAU/Ambulatory/Procedural Areas - Information given) - - - - No - Patient declined -    Tobacco Social History   Tobacco Use  Smoking Status Former Smoker  . Packs/day: 2.00  . Years: 30.00  . Pack years: 60.00  . Last attempt to quit: 10/07/2005  . Years since quitting: 11.8  Smokeless Tobacco Never Used     Counseling given: No   Clinical Intake:  Pre-visit preparation completed: Yes  Pain : No/denies pain Pain Score: 0-No pain     Nutritional Status: BMI of 19-24  Normal Nutritional Risks: None Diabetes: No  How often do you need to have someone help you when you read instructions, pamphlets, or other written materials from your doctor or pharmacy?: 1 - Never What is the last grade level you completed in school?: 12th grade  Interpreter Needed?: No  Comments: pt lives alone Information entered by :: LPinson, LPN  Past Medical History:  Diagnosis Date  . Anemia   . 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 DJD (degenerative joint disease), cervical   . Fibromyalgia   . H/O: pneumonia   .  History of tobacco abuse quit 10/08   on nicotine patch  . Hypertension   . Hypokalemia   . 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 Thrush 12/11/2010   Past Surgical History:  Procedure Laterality Date  . CERVICAL LAMINECTOMY  1995  . KNEE SURGERY  1990   Left x 2  . 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 LOOP RECORDER IMPLANT N/A 04/19/2014   Procedure: LOOP RECORDER IMPLANT;  Surgeon: JThompson Grayer MD;  [facility];  Service: Cardiovascular;  Laterality: N/A;  . OTHER SURGICAL HISTORY  2008   Gamma knife surgery to remove brain met   . PORTACATH PLACEMENT  -[name]   TIP IN CAVOATRIAL JUNCTION  . TEE WITHOUT CARDIOVERSION N/A 04/19/2014   Procedure: TRANSESOPHAGEAL ECHOCARDIOGRAM (TEE);  Surgeon: DLarey Dresser MD;  [facility];  Service: Cardiovascular;  Laterality: N/A;   Family History  Problem Relation Age of Onset  . Diabetes Father   . Lymphoma Father   . Stroke Father   . Liver cancer Mother   . Lung cancer Mother   . Cancer Mother   . Cancer Sister 57      ovarian cancer  . Hyperlipidemia Brother   . Healthy Daughter   . Healthy Son   .  Healthy Son    Social History   Socioeconomic History  . Marital status: Divorced    Spouse name: Not on file  . Number of children: 3  . Years of education: Not on file  . Highest education level: Not on file  Occupational History  . Occupation: DISABLED    Employer: UNEMPLOYED  Social Needs  . Financial resource strain: Not on file  . Food insecurity:    Worry: Not on file    Inability: Not on file  . Transportation needs:    Medical: Not on file    Non-medical: Not on file  Tobacco Use  . Smoking status: Former Smoker    Packs/day: 2.00    Years: 30.00    Pack years: 60.00    Last attempt to quit: 10/07/2005    Years since quitting: 11.8  . Smokeless tobacco: Never Used  Substance and  Sexual Activity  . Alcohol use: No  . Drug use: No  . Sexual activity: Never  Lifestyle  . Physical activity:    Days per week: Not on file    Minutes per session: Not on file  . Stress: Not on file  Relationships  . Social connections:    Talks on phone: Not on file    Gets together: Not on file    Attends religious service: Not on file    Active member of club or organization: Not on file    Attends meetings of clubs or organizations: Not on file    Relationship status: Not on file  Other Topics Concern  . Not on file  Social History Narrative   Divorced (alcoholic)    Regular exercise: yes, walking   Diet: fruits and veggies, walking    No living will,  HCPOA (son Cruz Condon III), DNR (reviewed 2014)                Outpatient Encounter Medications as of 08/05/2017  Medication Sig  . amLODipine (NORVASC) 10 MG tablet TAKE 1 TABLET (10 MG TOTAL) BY MOUTH DAILY.  Marland Kitchen aspirin 325 MG EC tablet TAKE 1 TABLET BY MOUTH EVERY DAY  . atorvastatin (LIPITOR) 40 MG tablet TAKE 1 TABLET BY MOUTH EVERY DAY AT 6 PM  . FeFum-FePoly-FA-B Cmp-C-Biot (INTEGRA PLUS) CAPS Take 1 capsule by mouth every morning.  . fluticasone (FLONASE) 50 MCG/ACT nasal spray Place 1-2 sprays into both nostrils daily as needed.  . folic acid (FOLVITE) 1 MG tablet TAKE 1 TABLET BY MOUTH EVERY DAY  . furosemide (LASIX) 20 MG tablet TAKE 1 TABLET (20 MG TOTAL) BY MOUTH DAILY. AS NEEDED FOR SWELLING.  . lacosamide (VIMPAT) 50 MG TABS tablet Take by mouth.  . lidocaine-prilocaine (EMLA) cream Apply to Port-A-Cath as directed 1-2 hours prior to chemotherapy  . lisinopril (PRINIVIL,ZESTRIL) 20 MG tablet TAKE 2 TABLETS (40 MG TOTAL) BY MOUTH DAILY.  Marland Kitchen morphine (MS CONTIN) 60 MG 12 hr tablet Take 1 tablet (60 mg total) by mouth 2 (two) times daily.  Marland Kitchen morphine (MSIR) 30 MG tablet Take 1 tablet (30 mg total) by mouth every 4 (four) hours as needed (breakthrough pain).  . Olopatadine HCl 0.2 % SOLN Apply 1 drop to eye  daily.  . ondansetron (ZOFRAN) 8 MG tablet TAKE 1 TABLET (8 MG TOTAL) BY MOUTH EVERY 8 (EIGHT) HOURS AS NEEDED FOR NAUSEA OR VOMITING.  . predniSONE (DELTASONE) 50 MG tablet 1 tablet by mouth 13 hours ,7 hours and 1 hour  before CT scan  . prochlorperazine (  COMPAZINE) 10 MG tablet Take 10 mg by mouth.  . triamcinolone (KENALOG) 0.025 % ointment Apply 1 application topically 2 (two) times daily.  . diphenhydrAMINE (BENADRYL) 25 MG tablet Take 1 tablet (25 mg total) by mouth once. 2 hours before CT scan on 09/18/2015   No facility-administered encounter medications on file as of 08/05/2017.     Activities of Daily Living In your present state of health, do you have any difficulty performing the following activities: 08/05/2017  Hearing? N  Vision? N  Difficulty concentrating or making decisions? N  Walking or climbing stairs? Y  Dressing or bathing? N  Doing errands, shopping? N  Preparing Food and eating ? N  Using the Toilet? N  In the past six months, have you accidently leaked urine? N  Do you have problems with loss of bowel control? N  Managing your Medications? N  Managing your Finances? N  Housekeeping or managing your Housekeeping? N  Some recent data might be hidden    Patient Care Team: [name], MD as PCP - General Curt Bears, MD as Consulting Physician (Oncology)    Assessment:   This is a routine wellness examination for Arina.   Hearing Screening   125Hz 250Hz 500Hz 1000Hz 2000Hz 3000Hz 4000Hz 6000Hz 8000Hz  Right ear:   40 40 40  40    Left ear:   40 40 40  40    Vision Screening Comments: Vision exam in 2018 with Dr. Kerin Ransom    Exercise Activities and Dietary recommendations Current Exercise Habits: The patient does not participate in regular exercise at present, Exercise limited by: None identified  Goals    . Patient Stated     Starting 08/05/2017, I will continue to take medications as prescribed.        Fall Risk Fall Risk  08/05/2017  08/09/2016 05/08/2016 07/27/2015 01/18/2014  Falls in the past year? _0    Depression Screen PHQ 2/9 Scores 08/05/2017 08/09/2016 07/27/2015 04/26/2014  PHQ - 2 Score 0 0 1 6  PHQ- 9 Score 0 - - -     Cognitive Function MMSE - Mini Mental State Exam 08/05/2017 07/27/2015  Orientation to time 5 5  Orientation to Place 5 5  Registration 3 3  Attention/ Calculation 0 0  Recall 3 2  Recall-comments - pt was unable to recall 1 of 3 words  Language- name 2 objects 0 0  Language- repeat 1 1  Language- follow 3 step command 3 3  Language- read & follow direction 0 0  Write a sentence 0 0  Copy design 0 0  Total score 20 19       PLEASE NOTE: A Mini-Cog screen was completed. Maximum score is 20. A value of 0 denotes this part of Folstein MMSE was not completed or the patient failed this part of the Mini-Cog screening.   Mini-Cog Screening Orientation to Time - Max 5 pts Orientation to Place - Max 5 pts Registration - Max 3 pts Recall - Max 3 pts Language Repeat - Max 1 pts Language Follow 3 Step Command - Max 3 pts   Immunization History  Administered Date(s) Administered  . Influenza Split 10/01/2011  . Influenza Whole 11/08/2007, 12/07/2008  . Influenza,inj,Quad PF,6+ Mos 10/13/2012, 09/14/2013, 09/27/2014, 10/01/2016  . Pneumococcal Polysaccharide-23 10/16/2005, 02/12/2011  . Td 01/07/2002, 01/08/2008   Screening Tests Health Maintenance  Topic Date Due  . OPHTHALMOLOGY EXAM  05/22/2018 (Originally 05/21/2017)  . PNA vac Low  Risk Adult (1 of 2 - PCV13) 08/07/2018 (Originally 07/24/2017)  . DEXA SCAN  01/07/2019 (Originally 07/24/2017)  . MAMMOGRAM  07/27/2023 (Originally 06/21/2010)  . PAP SMEAR  07/27/2023 (Originally 07/24/1973)  . INFLUENZA VACCINE  08/07/2017  . FOOT EXAM  08/09/2017  . TETANUS/TDAP  01/07/2018  . HEMOGLOBIN A1C  02/05/2018  . COLONOSCOPY  06/21/2018  . Hepatitis C Screening  Completed  . HIV Screening  Completed      Plan:     I have  personally reviewed, addressed, and noted the following in the patient's chart:  A. Medical and social history B. Use of alcohol, tobacco or illicit drugs  C. Current medications and supplements D. Functional ability and status E.  Nutritional status F.  Physical activity G. Advance directives H. List of other physicians I.  Hospitalizations, surgeries, and ER visits in previous 12 months J.  [place] to include hearing, vision, cognitive, depression L. Referrals and appointments - none  In addition, I have reviewed and discussed with patient certain preventive protocols, quality metrics, and best practice recommendations. A written personalized care plan for preventive services as well as general preventive health recommendations were provided to patient.  See attached scanned questionnaire for additional information.   Signed,   Lindell Noe, MHA, BS, LPN Health Coach

## 2017-08-05 NOTE — Progress Notes (Signed)
PCP notes:   Health maintenance:  Eye exam - addressed PNA vaccine - addressed A1C - completed  Abnormal screenings:   None   Patient concerns:   None  Nurse concerns:  None  Next PCP appt:   08/14/17 @ 1100

## 2017-08-06 NOTE — Progress Notes (Signed)
I reviewed health advisor's note, was available for consultation on the day of service listed in this note, and agree with documentation and plan. Layten Aiken, MD.   

## 2017-08-12 ENCOUNTER — Inpatient Hospital Stay: Payer: Medicare Other

## 2017-08-12 ENCOUNTER — Encounter: Payer: Self-pay | Admitting: Internal Medicine

## 2017-08-12 ENCOUNTER — Inpatient Hospital Stay: Payer: Medicare Other | Attending: Internal Medicine

## 2017-08-12 ENCOUNTER — Inpatient Hospital Stay (HOSPITAL_BASED_OUTPATIENT_CLINIC_OR_DEPARTMENT_OTHER): Payer: Medicare Other | Admitting: Internal Medicine

## 2017-08-12 VITALS — BP 133/58 | HR 85 | Temp 98.0°F | Resp 18 | Ht 62.5 in | Wt 110.2 lb

## 2017-08-12 DIAGNOSIS — C3411 Malignant neoplasm of upper lobe, right bronchus or lung: Secondary | ICD-10-CM | POA: Diagnosis not present

## 2017-08-12 DIAGNOSIS — Z95828 Presence of other vascular implants and grafts: Secondary | ICD-10-CM

## 2017-08-12 DIAGNOSIS — C3490 Malignant neoplasm of unspecified part of unspecified bronchus or lung: Secondary | ICD-10-CM

## 2017-08-12 DIAGNOSIS — C7931 Secondary malignant neoplasm of brain: Secondary | ICD-10-CM | POA: Insufficient documentation

## 2017-08-12 DIAGNOSIS — Z5111 Encounter for antineoplastic chemotherapy: Secondary | ICD-10-CM | POA: Diagnosis not present

## 2017-08-12 DIAGNOSIS — C3410 Malignant neoplasm of upper lobe, unspecified bronchus or lung: Secondary | ICD-10-CM

## 2017-08-12 LAB — COMPREHENSIVE METABOLIC PANEL
ALK PHOS: 76 U/L (ref 38–126)
ALT: 12 U/L (ref 0–44)
AST: 20 U/L (ref 15–41)
Albumin: 3.5 g/dL (ref 3.5–5.0)
Anion gap: 13 (ref 5–15)
BILIRUBIN TOTAL: 0.6 mg/dL (ref 0.3–1.2)
BUN: 18 mg/dL (ref 8–23)
CO2: 25 mmol/L (ref 22–32)
CREATININE: 0.99 mg/dL (ref 0.44–1.00)
Calcium: 9.5 mg/dL (ref 8.9–10.3)
Chloride: 103 mmol/L (ref 98–111)
GFR calc Af Amer: >60 mL/min (ref >60)
GFR calc non Af Amer: 59 mL/min — ABNORMAL LOW (ref >60)
Glucose, Bld: 230 mg/dL — ABNORMAL HIGH (ref 70–99)
Potassium: 4.1 mmol/L (ref 3.5–5.1)
Sodium: 141 mmol/L (ref 135–145)
TOTAL PROTEIN: 7 g/dL (ref 6.5–8.1)

## 2017-08-12 LAB — CBC WITH DIFFERENTIAL/PLATELET
BASOS ABS: 0.1 10*3/uL (ref 0.0–0.1)
Basophils Relative: 0 %
EOS ABS: 0 10*3/uL (ref 0.0–0.5)
EOS PCT: 0 %
HCT: 35.3 % (ref 34.8–46.6)
Hemoglobin: 11.6 g/dL (ref 11.6–15.9)
Lymphocytes Relative: 6 %
Lymphs Abs: 1 10*3/uL (ref 0.9–3.3)
MCH: 31.5 pg (ref 25.1–34.0)
MCHC: 32.8 g/dL (ref 31.5–36.0)
MCV: 96.1 fL (ref 79.5–101.0)
MONO ABS: 1.1 10*3/uL — AB (ref 0.1–0.9)
Monocytes Relative: 7 %
Neutro Abs: 13.7 10*3/uL — ABNORMAL HIGH (ref 1.5–6.5)
Neutrophils Relative %: 87 %
PLATELETS: 327 10*3/uL (ref 145–400)
RBC: 3.67 MIL/uL — AB (ref 3.70–5.45)
RDW: 16.3 % — AB (ref 11.2–14.5)
WBC: 15.9 10*3/uL — ABNORMAL HIGH (ref 3.9–10.3)

## 2017-08-12 MED ORDER — SODIUM CHLORIDE 0.9% FLUSH
10.0000 mL | Freq: Once | INTRAVENOUS | Status: AC
  Administered 2017-08-12: 10 mL
  Filled 2017-08-12: qty 10

## 2017-08-12 MED ORDER — SODIUM CHLORIDE 0.9 % IV SOLN
500.0000 mg/m2 | Freq: Once | INTRAVENOUS | Status: AC
  Administered 2017-08-12: 800 mg via INTRAVENOUS
  Filled 2017-08-12: qty 20

## 2017-08-12 MED ORDER — MORPHINE SULFATE ER 60 MG PO TBCR: 60.0000 mg | EXTENDED_RELEASE_TABLET | Freq: Two times a day (BID) | ORAL | 0 refills | Status: DC

## 2017-08-12 MED ORDER — ONDANSETRON HCL 8 MG PO TABS
ORAL_TABLET | ORAL | Status: AC
  Filled 2017-08-12: qty 1

## 2017-08-12 MED ORDER — SODIUM CHLORIDE 0.9 % IV SOLN
Freq: Once | INTRAVENOUS | Status: AC
  Administered 2017-08-12: 11:00:00 via INTRAVENOUS
  Filled 2017-08-12: qty 250

## 2017-08-12 MED ORDER — SODIUM CHLORIDE 0.9 % IJ SOLN
10.0000 mL | INTRAMUSCULAR | Status: DC | PRN
  Administered 2017-08-12: 10 mL
  Filled 2017-08-12: qty 10

## 2017-08-12 MED ORDER — HEPARIN SOD (PORK) LOCK FLUSH 100 UNIT/ML IV SOLN
500.0000 [IU] | Freq: Once | INTRAVENOUS | Status: AC | PRN
  Administered 2017-08-12: 500 [IU]
  Filled 2017-08-12: qty 5

## 2017-08-12 MED ORDER — ONDANSETRON HCL 8 MG PO TABS: 8.0000 mg | ORAL_TABLET | Freq: Three times a day (TID) | ORAL | 0 refills | Status: DC | PRN

## 2017-08-12 MED ORDER — ONDANSETRON HCL 8 MG PO TABS
8.0000 mg | ORAL_TABLET | Freq: Once | ORAL | Status: AC
  Administered 2017-08-12: 8 mg via ORAL

## 2017-08-12 MED ORDER — DEXAMETHASONE SODIUM PHOSPHATE 10 MG/ML IJ SOLN
10.0000 mg | Freq: Once | INTRAMUSCULAR | Status: AC
  Administered 2017-08-12: 10 mg via INTRAVENOUS

## 2017-08-12 MED ORDER — MORPHINE SULFATE 30 MG PO TABS: 30.0000 mg | ORAL_TABLET | ORAL | 0 refills | Status: DC | PRN

## 2017-08-12 MED ORDER — DEXAMETHASONE SODIUM PHOSPHATE 10 MG/ML IJ SOLN
INTRAMUSCULAR | Status: AC
  Filled 2017-08-12: qty 1

## 2017-08-12 NOTE — Patient Instructions (Signed)
Danielle [name] Discharge Instructions for Patients Receiving Chemotherapy  Today you received the following chemotherapy agents Alimta   To help prevent nausea and vomiting after your treatment, we encourage you to take your nausea medication as directed.    If you develop nausea and vomiting that is not controlled by your nausea medication, call the clinic.   BELOW ARE SYMPTOMS THAT SHOULD BE REPORTED IMMEDIATELY:  *FEVER GREATER THAN 100.5 F  *CHILLS WITH OR WITHOUT FEVER  NAUSEA AND VOMITING THAT IS NOT CONTROLLED WITH YOUR NAUSEA MEDICATION  *UNUSUAL SHORTNESS OF BREATH  *UNUSUAL BRUISING OR BLEEDING  TENDERNESS IN MOUTH AND THROAT WITH OR WITHOUT PRESENCE OF ULCERS  *URINARY PROBLEMS  *BOWEL PROBLEMS  UNUSUAL RASH Items with * indicate a potential emergency and should be followed up as soon as possible.  Feel free to call the clinic should you have any questions or concerns. The clinic phone number is [phone].  Please show the [name] at check-in to the Emergency Department and triage nurse.

## 2017-08-12 NOTE — Progress Notes (Signed)
[place] [phone]   [phone]  OFFICE PROGRESS NOTE  [name], MD [place] [address]  DIAGNOSIS: Metastatic non-small cell lung cancer initially diagnosed as locally advanced stage IIIB with a right Pancoast tumor involving the vertebral body as well as prominent canal invasion with spinal cord compression in October 2007. The patient also has metastatic disease to the brain in April 2008.  PRIOR THERAPY: 1. Status post concurrent chemoradiation with weekly carboplatin and paclitaxel, last dose was given November 18, 2005. 2. 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 3. Status post gamma knife radiotherapy to a solitary brain lesion located in the superior frontal area of the brain at [facility] in April of 2008. 4. Status post palliative radiotherapy to the lateral abdominal wall metastatic lesion under the care of Dr. Lisbeth Renshaw, completed March of 2009. 5. Status post 6 cycles of systemic chemotherapy with carboplatin and Alimta. Last dose was given July 26, 2007 with disease stabilization. 6. Gamma knife stereotactic radiotherapy to 2 brain lesions one involving the right frontal dural based as well as right parietal lesion performed on 05/07/2012 under the care of Dr. Vallarie Mare at [facility].  CURRENT THERAPY: Maintenance systemic chemotherapy with Alimta 500 MG/M2 every 3 weeks, status post 172 cycles.  INTERVAL HISTORY: Danielle [name] 67 y.o. female returns to the clinic today for follow-up visit.  The patient is feeling fine as usual with no concerning complaints.  She continues to tolerate her maintenance treatment with Alimta fairly well.  She denied having any chest pain, shortness of breath, cough or hemoptysis.  She denied having any fever or chills.  She has occasional episodes of nausea with no vomiting, diarrhea or  constipation.  She has no recent weight loss or night sweats.  The patient is here today for evaluation before starting cycle #173.  MEDICAL HISTORY: Past Medical History:  Diagnosis Date  . Anemia   . 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 CVA (cerebral vascular accident) ([place]) 04/2014   pt states had 2 cva's within 2 wks  . DJD (degenerative joint disease), cervical   . Fibromyalgia   . H/O: pneumonia   . History of tobacco abuse quit 10/08   on nicotine patch  . Hypertension   . Hypokalemia   . Lung cancer ([place]) dx'd 09/2005   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 had chemo and radiation for lung ca  . Thrush 12/11/2010    ALLERGIES:  is allergic to contrast media [iodinated diagnostic agents]; iohexol; sulfa drugs cross reactors; and co-trimoxazole injection [sulfamethoxazole-trimethoprim].  MEDICATIONS:  Current Outpatient Medications  Medication Sig Dispense Refill  . amLODipine (NORVASC) 10 MG tablet TAKE 1 TABLET (10 MG TOTAL) BY MOUTH DAILY. 90 tablet 1  . aspirin 325 MG EC tablet TAKE 1 TABLET BY MOUTH EVERY DAY 90 tablet 0  . atorvastatin (LIPITOR) 40 MG tablet TAKE 1 TABLET BY MOUTH EVERY DAY AT 6 PM 90 tablet 3  . FeFum-FePoly-FA-B Cmp-C-Biot (INTEGRA PLUS) CAPS Take 1 capsule by mouth every morning. 90 capsule 2  . fluticasone (FLONASE) 50 MCG/ACT nasal spray Place 1-2 sprays into both nostrils daily as needed. 16 g 5  . folic acid (FOLVITE) 1 MG tablet TAKE 1 TABLET BY MOUTH EVERY DAY 90 tablet 1  . furosemide (LASIX) 20 MG tablet TAKE 1 TABLET (20 MG TOTAL) BY MOUTH DAILY. AS  NEEDED FOR SWELLING. 20 tablet 0  . lacosamide (VIMPAT) 50 MG TABS tablet Take by mouth.    . lidocaine-prilocaine (EMLA) cream Apply to Port-A-Cath as directed 1-2 hours prior to chemotherapy 30 g 1  . lisinopril (PRINIVIL,ZESTRIL) 20 MG tablet TAKE 2 TABLETS (40 MG TOTAL) BY MOUTH DAILY. 180 tablet 1  . morphine (MS CONTIN) 60 MG 12 hr tablet Take 1 tablet (60 mg total) by  mouth 2 (two) times daily. 60 tablet 0  . morphine (MSIR) 30 MG tablet Take 1 tablet (30 mg total) by mouth every 4 (four) hours as needed (breakthrough pain). 60 tablet 0  . Olopatadine HCl 0.2 % SOLN Apply 1 drop to eye daily. 2.5 mL 1  . ondansetron (ZOFRAN) 8 MG tablet TAKE 1 TABLET (8 MG TOTAL) BY MOUTH EVERY 8 (EIGHT) HOURS AS NEEDED FOR NAUSEA OR VOMITING. 30 tablet 0  . predniSONE (DELTASONE) 50 MG tablet 1 tablet by mouth 13 hours ,7 hours and 1 hour  before CT scan 3 tablet 3  . prochlorperazine (COMPAZINE) 10 MG tablet Take 10 mg by mouth.    . triamcinolone (KENALOG) 0.025 % ointment Apply 1 application topically 2 (two) times daily. 30 g 0  . diphenhydrAMINE (BENADRYL) 25 MG tablet Take 1 tablet (25 mg total) by mouth once. 2 hours before CT scan on 09/18/2015 10 tablet 0   No current facility-administered medications for this visit.     SURGICAL HISTORY:  Past Surgical History:  Procedure Laterality Date  . CERVICAL LAMINECTOMY  1995  . KNEE SURGERY  1990   Left x 2  . 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 LOOP RECORDER IMPLANT N/A 04/19/2014   Procedure: LOOP RECORDER IMPLANT;  Surgeon: Thompson Grayer, MD;  Location: [facility] CATH LAB;  Service: Cardiovascular;  Laterality: N/A;  . OTHER SURGICAL HISTORY  2008   Gamma knife surgery to remove brain met   . PORTACATH PLACEMENT  -[name]   TIP IN CAVOATRIAL JUNCTION  . TEE WITHOUT CARDIOVERSION N/A 04/19/2014   Procedure: TRANSESOPHAGEAL ECHOCARDIOGRAM (TEE);  Surgeon: Larey Dresser, MD;  Location: [place];  Service: Cardiovascular;  Laterality: N/A;    REVIEW OF SYSTEMS:  A comprehensive review of systems was negative except for: Constitutional: positive for fatigue   PHYSICAL EXAMINATION: General appearance: alert, cooperative and no distress Head: Normocephalic, without obvious abnormality, atraumatic Neck: no adenopathy, no JVD, supple, symmetrical, trachea midline and thyroid not  enlarged, symmetric, no tenderness/mass/nodules Lymph nodes: Cervical, supraclavicular, and axillary nodes normal. Resp: clear to auscultation bilaterally Back: symmetric, no curvature. ROM normal. No CVA tenderness. Cardio: regular rate and rhythm, S1, S2 normal, no murmur, click, rub or gallop GI: soft, non-tender; bowel sounds normal; no masses,  no organomegaly Extremities: extremities normal, atraumatic, no cyanosis or edema  ECOG PERFORMANCE STATUS: 1 - Symptomatic but completely ambulatory  Blood pressure (!) 133/58, pulse 85, temperature 98 F (36.7 C), temperature source Oral, resp. rate 18, height 5' 2.5" (1.588 m), weight 110 lb 3.2 oz (50 kg), SpO2 99 %.  LABORATORY DATA: Lab Results  Component Value Date   WBC 15.9 (H) 08/12/2017   HGB 11.6 08/12/2017   HCT 35.3 08/12/2017   MCV 96.1 08/12/2017   PLT 327 08/12/2017      Chemistry      Component Value Date/Time   NA 139 07/22/2017 0826   NA 142 12/24/2016 0816   K 4.7 07/22/2017 0826   K  4.4 12/24/2016 0816   CL 105 07/22/2017 0826   CL 106 06/30/2012 1004   CO2 27 07/22/2017 0826   CO2 28 12/24/2016 0816   BUN 16 07/22/2017 0826   BUN 15.8 12/24/2016 0816   CREATININE 0.87 07/22/2017 0826   CREATININE 1.1 12/24/2016 0816      Component Value Date/Time   CALCIUM 9.5 07/22/2017 0826   CALCIUM 9.6 12/24/2016 0816   ALKPHOS 65 07/22/2017 0826   ALKPHOS 80 12/24/2016 0816   AST 20 07/22/2017 0826   AST 20 12/24/2016 0816   ALT 11 07/22/2017 0826   ALT 11 12/24/2016 0816   BILITOT 0.4 07/22/2017 0826   BILITOT 0.34 12/24/2016 0816       RADIOGRAPHIC STUDIES: No results found.  ASSESSMENT AND PLAN: This is a very pleasant 67 years old white female with metastatic non-small cell lung cancer diagnosed in October 2007 status post concurrent chemoradiation followed by consolidation chemotherapy and she is currently on maintenance treatment with single agent Alimta status post 172 cycles. She continues to  tolerate her treatment well with no concerning complaints. I recommended for the patient to continue her current treatment with maintenance Alimta and she will proceed with cycle #173 today. I will see her back for follow-up visit in 3 weeks for evaluation before cycle 174. The patient was given refill of her pain medication today but will not refill it until next week when it is due. She was advised to call immediately if she has any concerning symptoms in the interval. The patient voices understanding of current disease status and treatment options and is in agreement with the current care plan. All questions were answered. The patient knows to call the clinic with any problems, questions or concerns. We can certainly see the patient much sooner if necessary.  Disclaimer: This note was dictated with voice recognition software. Similar sounding words can inadvertently be transcribed and may not be corrected upon review.

## 2017-08-12 NOTE — Telephone Encounter (Signed)
Printed avs and calender of upcoming appointment. Per 8/6 los and gave top patient in infusion.

## 2017-08-14 ENCOUNTER — Ambulatory Visit: Payer: Medicare Other | Admitting: Family Medicine

## 2017-08-15 ENCOUNTER — Other Ambulatory Visit: Payer: Self-pay | Admitting: Internal Medicine

## 2017-08-30 ENCOUNTER — Other Ambulatory Visit: Payer: Self-pay | Admitting: Family Medicine

## 2017-09-01 ENCOUNTER — Inpatient Hospital Stay: Payer: Medicare Other

## 2017-09-01 ENCOUNTER — Other Ambulatory Visit: Payer: Self-pay

## 2017-09-01 ENCOUNTER — Encounter: Payer: Self-pay | Admitting: Oncology

## 2017-09-01 ENCOUNTER — Inpatient Hospital Stay (HOSPITAL_BASED_OUTPATIENT_CLINIC_OR_DEPARTMENT_OTHER): Payer: Medicare Other | Admitting: Oncology

## 2017-09-01 ENCOUNTER — Telehealth: Payer: Self-pay | Admitting: Internal Medicine

## 2017-09-01 ENCOUNTER — Other Ambulatory Visit: Payer: Self-pay | Admitting: Internal Medicine

## 2017-09-01 VITALS — BP 142/67 | HR 94 | Temp 97.9°F | Resp 18 | Ht 62.5 in | Wt 113.8 lb

## 2017-09-01 DIAGNOSIS — Z5111 Encounter for antineoplastic chemotherapy: Secondary | ICD-10-CM | POA: Diagnosis not present

## 2017-09-01 DIAGNOSIS — C3411 Malignant neoplasm of upper lobe, right bronchus or lung: Secondary | ICD-10-CM

## 2017-09-01 DIAGNOSIS — Z95828 Presence of other vascular implants and grafts: Secondary | ICD-10-CM

## 2017-09-01 DIAGNOSIS — C7931 Secondary malignant neoplasm of brain: Secondary | ICD-10-CM

## 2017-09-01 DIAGNOSIS — Z91041 Radiographic dye allergy status: Secondary | ICD-10-CM

## 2017-09-01 DIAGNOSIS — C3490 Malignant neoplasm of unspecified part of unspecified bronchus or lung: Secondary | ICD-10-CM

## 2017-09-01 LAB — CBC WITH DIFFERENTIAL/PLATELET
Basophils Absolute: 0.1 10*3/uL (ref 0.0–0.1)
Basophils Relative: 1 %
Eosinophils Absolute: 0 10*3/uL (ref 0.0–0.5)
Eosinophils Relative: 0 %
HEMATOCRIT: 34.8 % (ref 34.8–46.6)
HEMOGLOBIN: 11.6 g/dL (ref 11.6–15.9)
LYMPHS ABS: 1 10*3/uL (ref 0.9–3.3)
LYMPHS PCT: 10 %
MCH: 32.3 pg (ref 25.1–34.0)
MCHC: 33.3 g/dL (ref 31.5–36.0)
MCV: 96.9 fL (ref 79.5–101.0)
MONOS PCT: 7 %
Monocytes Absolute: 0.7 10*3/uL (ref 0.1–0.9)
NEUTROS ABS: 7.9 10*3/uL — AB (ref 1.5–6.5)
NEUTROS PCT: 82 %
Platelets: 320 10*3/uL (ref 145–400)
RBC: 3.59 MIL/uL — AB (ref 3.70–5.45)
RDW: 14.6 % — ABNORMAL HIGH (ref 11.2–14.5)
WBC: 9.7 10*3/uL (ref 3.9–10.3)

## 2017-09-01 LAB — COMPREHENSIVE METABOLIC PANEL
ALK PHOS: 74 U/L (ref 38–126)
ALT: 13 U/L (ref 0–44)
AST: 21 U/L (ref 15–41)
Albumin: 3.2 g/dL — ABNORMAL LOW (ref 3.5–5.0)
Anion gap: 8 (ref 5–15)
BILIRUBIN TOTAL: 0.4 mg/dL (ref 0.3–1.2)
BUN: 11 mg/dL (ref 8–23)
CALCIUM: 9.7 mg/dL (ref 8.9–10.3)
CO2: 27 mmol/L (ref 22–32)
Chloride: 106 mmol/L (ref 98–111)
Creatinine, Ser: 0.78 mg/dL (ref 0.44–1.00)
GFR calc non Af Amer: >60 mL/min (ref >60)
Glucose, Bld: 173 mg/dL — ABNORMAL HIGH (ref 70–99)
Potassium: 4 mmol/L (ref 3.5–5.1)
SODIUM: 141 mmol/L (ref 135–145)
TOTAL PROTEIN: 6.7 g/dL (ref 6.5–8.1)

## 2017-09-01 MED ORDER — PREDNISONE 50 MG PO TABS: ORAL_TABLET | ORAL | 3 refills | Status: DC

## 2017-09-01 MED ORDER — DIPHENHYDRAMINE HCL 25 MG PO TABS: 25.0000 mg | ORAL_TABLET | ORAL | 0 refills | Status: AC

## 2017-09-01 MED ORDER — SODIUM CHLORIDE 0.9% FLUSH
10.0000 mL | Freq: Once | INTRAVENOUS | Status: AC
  Administered 2017-09-01: 10 mL
  Filled 2017-09-01: qty 10

## 2017-09-01 NOTE — Assessment & Plan Note (Addendum)
This is a very pleasant 67 year old white female with metastatic non-small cell lung cancer diagnosed in October 2007 status post concurrent chemoradiation followed by consolidation chemotherapy and she is currently on maintenance treatment with single agent Alimta status post 173 cycles. She continues to tolerate her treatment well with no concerning complaints. I recommended for the patient proceed with cycle #174 of her treatment as scheduled tomorrow. The patient will have a restaging CT scan of the chest, abdomen, pelvis prior to her next visit.  She was given premedications for her CT due to her CT contrast allergy. She will follow-up in 3 weeks for evaluation prior to cycle #175 and to review her restaging CT scan results.  For pain, the patient will continue on MS Contin.  She was advised to call immediately if she has any concerning symptoms in the interval. The patient voices understanding of current disease status and treatment options and is in agreement with the current care plan. All questions were answered. The patient knows to call the clinic with any problems, questions or concerns. We can certainly see the patient much sooner if necessary.

## 2017-09-01 NOTE — Telephone Encounter (Signed)
Appts scheduled AVS/Calendar printed/ contrast material provided along with phone number for scheduling per 8/26 los

## 2017-09-01 NOTE — Progress Notes (Signed)
[place] OFFICE PROGRESS NOTE  Danielle Sanders, MD [place] [address]  DIAGNOSIS: Metastatic non-small cell lung cancer initially diagnosed as locally advanced stage IIIB with a right Pancoast tumor involving the vertebral body as well as prominent canal invasion with spinal cord compression in October 2007. The patient also has metastatic disease to the brain in April 2008.  PRIOR THERAPY: 1. Status post concurrent chemoradiation with weekly carboplatin and paclitaxel, last dose was given November 18, 2005. 2. 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 3. Status post gamma knife radiotherapy to a solitary brain lesion located in the superior frontal area of the brain at [facility] in April of 2008. 4. Status post palliative radiotherapy to the lateral abdominal wall metastatic lesion under the care of Dr. Lisbeth Renshaw, completed March of 2009. 5. Status post 6 cycles of systemic chemotherapy with carboplatin and Alimta. Last dose was given July 26, 2007 with disease stabilization. 6. Gamma knife stereotactic radiotherapy to 2 brain lesions one involving the right frontal dural based as well as right parietal lesion performed on 05/07/2012 under the care of Dr. Vallarie Mare at [facility].  CURRENT THERAPY: Maintenance systemic chemotherapy with Alimta 500 MG/M2 every 3 weeks, status post 173 cycles.  INTERVAL HISTORY: Danielle [name] 67 y.o. female returns for routine follow-up visit accompanied by her daughter.  The patient is feeling fine today and has no specific complaints area she continues to tolerate her maintenance treatment with Alimta fairly well.  She denies fevers and chills.  Denies chest pain, shortness of breath, cough, hemoptysis.  She reports intermittent nausea with no vomiting.  Denies diarrhea and constipation.  Denies recent weight loss or night sweats.   The patient is here for evaluation prior to cycle #174 of her treatment.  MEDICAL HISTORY: Past Medical History:  Diagnosis Date  . Anemia   . 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 CVA (cerebral vascular accident) ([place]) 04/2014   pt states had 2 cva's within 2 wks  . DJD (degenerative joint disease), cervical   . Fibromyalgia   . H/O: pneumonia   . History of tobacco abuse quit 10/08   on nicotine patch  . Hypertension   . Hypokalemia   . Lung cancer ([place]) dx'd 09/2005   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 had chemo and radiation for lung ca  . Thrush 12/11/2010    ALLERGIES:  is allergic to contrast media [iodinated diagnostic agents]; iohexol; sulfa drugs cross reactors; and co-trimoxazole injection [sulfamethoxazole-trimethoprim].  MEDICATIONS:  Current Outpatient Medications  Medication Sig Dispense Refill  . amLODipine (NORVASC) 10 MG tablet TAKE 1 TABLET (10 MG TOTAL) BY MOUTH DAILY. 90 tablet 1  . aspirin 325 MG EC tablet TAKE 1 TABLET BY MOUTH EVERY DAY 90 tablet 0  . atorvastatin (LIPITOR) 40 MG tablet TAKE 1 TABLET BY MOUTH EVERY DAY AT 6 PM 90 tablet 3  . FeFum-FePoly-FA-B Cmp-C-Biot (INTEGRA PLUS) CAPS Take 1 capsule by mouth every morning. 90 capsule 2  . fluticasone (FLONASE) 50 MCG/ACT nasal spray Place 1-2 sprays into both nostrils daily as needed. 16 g 5  . folic acid (FOLVITE) 1 MG tablet TAKE 1 TABLET BY MOUTH EVERY DAY 90 tablet 1  . furosemide (LASIX) 20 MG tablet TAKE 1 TABLET (20 MG TOTAL) BY MOUTH DAILY. AS NEEDED FOR SWELLING. 20 tablet 0  . lacosamide (VIMPAT) 50 MG TABS tablet Take by  mouth.    . lidocaine-prilocaine (EMLA) cream Apply to Port-A-Cath as directed 1-2 hours prior to chemotherapy 30 g 1  . lisinopril (PRINIVIL,ZESTRIL) 20 MG tablet TAKE 2 TABLETS (40 MG TOTAL) BY MOUTH DAILY. 180 tablet 1  . morphine (MS CONTIN) 60 MG 12 hr tablet Take 1 tablet (60 mg total) by mouth 2 (two) times daily. 60 tablet 0  . morphine (MSIR)  30 MG tablet Take 1 tablet (30 mg total) by mouth every 4 (four) hours as needed (breakthrough pain). 60 tablet 0  . Olopatadine HCl 0.2 % SOLN Apply 1 drop to eye daily. 2.5 mL 1  . ondansetron (ZOFRAN) 8 MG tablet Take 1 tablet (8 mg total) by mouth every 8 (eight) hours as needed for nausea or vomiting. 30 tablet 0  . predniSONE (DELTASONE) 50 MG tablet 1 tablet by mouth 13 hours ,7 hours and 1 hour  before CT scan 3 tablet 3  . triamcinolone (KENALOG) 0.025 % ointment Apply 1 application topically 2 (two) times daily. 30 g 0  . diphenhydrAMINE (BENADRYL) 25 MG tablet Take 1 tablet (25 mg total) by mouth as directed. 2 hours before CT scan. 5 tablet 0   No current facility-administered medications for this visit.     SURGICAL HISTORY:  Past Surgical History:  Procedure Laterality Date  . CERVICAL LAMINECTOMY  1995  . KNEE SURGERY  1990   Left x 2  . 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 LOOP RECORDER IMPLANT N/A 04/19/2014   Procedure: LOOP RECORDER IMPLANT;  Surgeon: Thompson Grayer, MD;  Location: [facility] CATH LAB;  Service: Cardiovascular;  Laterality: N/A;  . OTHER SURGICAL HISTORY  2008   Gamma knife surgery to remove brain met   . PORTACATH PLACEMENT  -[name]   TIP IN CAVOATRIAL JUNCTION  . TEE WITHOUT CARDIOVERSION N/A 04/19/2014   Procedure: TRANSESOPHAGEAL ECHOCARDIOGRAM (TEE);  Surgeon: Larey Dresser, MD;  Location: [place];  Service: Cardiovascular;  Laterality: N/A;    REVIEW OF SYSTEMS:   Review of Systems  Constitutional: Negative for appetite change, chills, fatigue, fever and unexpected weight change.  HENT:   Negative for mouth sores, nosebleeds, sore throat and trouble swallowing.   Eyes: Negative for eye problems and icterus.  Respiratory: Negative for cough, hemoptysis, shortness of breath and wheezing.   Cardiovascular: Negative for chest pain and leg swelling.  Gastrointestinal: Negative for abdominal pain, constipation,  diarrhea, and vomiting.  Positive for mild nausea. Genitourinary: Negative for bladder incontinence, difficulty urinating, dysuria, frequency and hematuria.   Musculoskeletal: Negative for back pain, gait problem, neck pain and neck stiffness.  Skin: Negative for itching and rash.  Neurological: Negative for dizziness, extremity weakness, gait problem, headaches, light-headedness and seizures.  Hematological: Negative for adenopathy. Does not bruise/bleed easily.  Psychiatric/Behavioral: Negative for confusion, depression and sleep disturbance. The patient is not nervous/anxious.     PHYSICAL EXAMINATION:  Blood pressure (!) 142/67, pulse 94, temperature 97.9 F (36.6 C), temperature source Oral, resp. rate 18, height 5' 2.5" (1.588 m), weight 113 lb 12.8 oz (51.6 kg), SpO2 98 %.  ECOG PERFORMANCE STATUS: 1 - Symptomatic but completely ambulatory  Physical Exam  Constitutional: Oriented to person, place, and time and well-developed, well-nourished, and in no distress. No distress.  HENT:  Head: Normocephalic and atraumatic.  Mouth/Throat: Oropharynx is clear and moist. No oropharyngeal exudate.  Eyes: Conjunctivae are normal. Right eye exhibits no discharge. Left eye exhibits no discharge.  No scleral icterus.  Neck: Normal range of motion. Neck supple.  Cardiovascular: Normal rate, regular rhythm, normal heart sounds and intact distal pulses.   Pulmonary/Chest: Effort normal and breath sounds normal. No respiratory distress. No wheezes. No rales.  Abdominal: Soft. Bowel sounds are normal. Exhibits no distension and no mass. There is no tenderness.  Musculoskeletal: Normal range of motion. Exhibits no edema.  Lymphadenopathy:    No cervical adenopathy.  Neurological: Alert and oriented to person, place, and time. Exhibits normal muscle tone. Gait normal. Coordination normal.  Skin: Skin is warm and dry. No rash noted. Not diaphoretic. No erythema. No pallor.  Psychiatric: Mood, memory  and judgment normal.  Vitals reviewed.  LABORATORY DATA: Lab Results  Component Value Date   WBC 9.7 09/01/2017   HGB 11.6 09/01/2017   HCT 34.8 09/01/2017   MCV 96.9 09/01/2017   PLT 320 09/01/2017      Chemistry      Component Value Date/Time   NA 141 09/01/2017 0909   NA 142 12/24/2016 0816   K 4.0 09/01/2017 0909   K 4.4 12/24/2016 0816   CL 106 09/01/2017 0909   CL 106 06/30/2012 1004   CO2 27 09/01/2017 0909   CO2 28 12/24/2016 0816   BUN 11 09/01/2017 0909   BUN 15.8 12/24/2016 0816   CREATININE 0.78 09/01/2017 0909   CREATININE 0.87 07/22/2017 0826   CREATININE 1.1 12/24/2016 0816      Component Value Date/Time   CALCIUM 9.7 09/01/2017 0909   CALCIUM 9.6 12/24/2016 0816   ALKPHOS 74 09/01/2017 0909   ALKPHOS 80 12/24/2016 0816   AST 21 09/01/2017 0909   AST 20 07/22/2017 0826   AST 20 12/24/2016 0816   ALT 13 09/01/2017 0909   ALT 11 07/22/2017 0826   ALT 11 12/24/2016 0816   BILITOT 0.4 09/01/2017 0909   BILITOT 0.4 07/22/2017 0826   BILITOT 0.34 12/24/2016 0816       RADIOGRAPHIC STUDIES:  No results found.   ASSESSMENT/PLAN:  Cancer of upper lobe of right lung This is a very pleasant 67 year old white female with metastatic non-small cell lung cancer diagnosed in October 2007 status post concurrent chemoradiation followed by consolidation chemotherapy and she is currently on maintenance treatment with single agent Alimta status post 173 cycles. She continues to tolerate her treatment well with no concerning complaints. I recommended for the patient proceed with cycle #174 of her treatment as scheduled tomorrow. The patient will have a restaging CT scan of the chest, abdomen, pelvis prior to her next visit.  She was given premedications for her CT due to her CT contrast allergy. She will follow-up in 3 weeks for evaluation prior to cycle #175 and to review her restaging CT scan results.  For pain, the patient will continue on MS Contin.  She  was advised to call immediately if she has any concerning symptoms in the interval. The patient voices understanding of current disease status and treatment options and is in agreement with the current care plan. All questions were answered. The patient knows to call the clinic with any problems, questions or concerns. We can certainly see the patient much sooner if necessary.   Orders Placed This Encounter  Procedures  . CT ABDOMEN PELVIS W CONTRAST    Standing Status:   Future    Standing Expiration Date:   09/02/2018    Order Specific Question:   If indicated for the ordered procedure, I authorize the administration of  contrast media per Radiology protocol    Answer:   Yes    Order Specific Question:   Preferred imaging location?    Answer:   [facility]    Order Specific Question:   Radiology Contrast Protocol - do NOT remove file path    Answer:   [email or web address]    Order Specific Question:   ** REASON FOR EXAM (FREE TEXT)    Answer:   Lung cancer. Restaging.  . CT CHEST W CONTRAST    Standing Status:   Future    Standing Expiration Date:   09/02/2018    Order Specific Question:   If indicated for the ordered procedure, I authorize the administration of contrast media per Radiology protocol    Answer:   Yes    Order Specific Question:   Preferred imaging location?    Answer:   [facility]    Order Specific Question:   Radiology Contrast Protocol - do NOT remove file path    Answer:   [email or web address]    Order Specific Question:   ** REASON FOR EXAM (FREE TEXT)    Answer:   Lung cancer. Restaging.     Mikey Bussing, DNP, AGPCNP-BC, AOCNP 09/01/17

## 2017-09-02 ENCOUNTER — Inpatient Hospital Stay: Payer: Medicare Other

## 2017-09-02 VITALS — BP 120/54 | HR 69 | Temp 98.3°F | Resp 17

## 2017-09-02 DIAGNOSIS — C3411 Malignant neoplasm of upper lobe, right bronchus or lung: Secondary | ICD-10-CM | POA: Diagnosis not present

## 2017-09-02 DIAGNOSIS — Z5111 Encounter for antineoplastic chemotherapy: Secondary | ICD-10-CM | POA: Diagnosis not present

## 2017-09-02 DIAGNOSIS — C7931 Secondary malignant neoplasm of brain: Secondary | ICD-10-CM | POA: Diagnosis not present

## 2017-09-02 MED ORDER — ONDANSETRON HCL 8 MG PO TABS
8.0000 mg | ORAL_TABLET | Freq: Once | ORAL | Status: AC
  Administered 2017-09-02: 8 mg via ORAL

## 2017-09-02 MED ORDER — DEXAMETHASONE SODIUM PHOSPHATE 10 MG/ML IJ SOLN
10.0000 mg | Freq: Once | INTRAMUSCULAR | Status: AC
  Administered 2017-09-02: 10 mg via INTRAVENOUS

## 2017-09-02 MED ORDER — ONDANSETRON HCL 8 MG PO TABS
ORAL_TABLET | ORAL | Status: AC
  Filled 2017-09-02: qty 1

## 2017-09-02 MED ORDER — DEXAMETHASONE SODIUM PHOSPHATE 10 MG/ML IJ SOLN
INTRAMUSCULAR | Status: AC
  Filled 2017-09-02: qty 1

## 2017-09-02 MED ORDER — SODIUM CHLORIDE 0.9 % IV SOLN
Freq: Once | INTRAVENOUS | Status: AC
  Administered 2017-09-02: 09:00:00 via INTRAVENOUS
  Filled 2017-09-02: qty 250

## 2017-09-02 MED ORDER — HEPARIN SOD (PORK) LOCK FLUSH 100 UNIT/ML IV SOLN
500.0000 [IU] | Freq: Once | INTRAVENOUS | Status: AC | PRN
  Administered 2017-09-02: 500 [IU]
  Filled 2017-09-02: qty 5

## 2017-09-02 MED ORDER — SODIUM CHLORIDE 0.9 % IV SOLN
500.0000 mg/m2 | Freq: Once | INTRAVENOUS | Status: AC
  Administered 2017-09-02: 800 mg via INTRAVENOUS
  Filled 2017-09-02: qty 20

## 2017-09-02 MED ORDER — CYANOCOBALAMIN 1000 MCG/ML IJ SOLN
INTRAMUSCULAR | Status: AC
  Filled 2017-09-02: qty 1

## 2017-09-02 MED ORDER — SODIUM CHLORIDE 0.9 % IJ SOLN
10.0000 mL | INTRAMUSCULAR | Status: DC | PRN
  Administered 2017-09-02: 10 mL
  Filled 2017-09-02: qty 10

## 2017-09-02 MED ORDER — CYANOCOBALAMIN 1000 MCG/ML IJ SOLN
1000.0000 ug | Freq: Once | INTRAMUSCULAR | Status: AC
  Administered 2017-09-02: 1000 ug via INTRAMUSCULAR

## 2017-09-02 NOTE — Patient Instructions (Signed)
[facility] Discharge Instructions for Patients Receiving Chemotherapy  Today you received the following chemotherapy agents Alimta   To help prevent nausea and vomiting after your treatment, we encourage you to take your nausea medication as directed.    If you develop nausea and vomiting that is not controlled by your nausea medication, call the clinic.   BELOW ARE SYMPTOMS THAT SHOULD BE REPORTED IMMEDIATELY:  *FEVER GREATER THAN 100.5 F  *CHILLS WITH OR WITHOUT FEVER  NAUSEA AND VOMITING THAT IS NOT CONTROLLED WITH YOUR NAUSEA MEDICATION  *UNUSUAL SHORTNESS OF BREATH  *UNUSUAL BRUISING OR BLEEDING  TENDERNESS IN MOUTH AND THROAT WITH OR WITHOUT PRESENCE OF ULCERS  *URINARY PROBLEMS  *BOWEL PROBLEMS  UNUSUAL RASH Items with * indicate a potential emergency and should be followed up as soon as possible.  Feel free to call the clinic should you have any questions or concerns. The clinic phone number is [phone].  Please show the [name] at check-in to the Emergency Department and triage nurse.

## 2017-09-03 ENCOUNTER — Other Ambulatory Visit: Payer: Self-pay | Admitting: Internal Medicine

## 2017-09-15 ENCOUNTER — Other Ambulatory Visit: Payer: Self-pay | Admitting: Internal Medicine

## 2017-09-15 DIAGNOSIS — C7931 Secondary malignant neoplasm of brain: Secondary | ICD-10-CM

## 2017-09-16 ENCOUNTER — Other Ambulatory Visit: Payer: Self-pay | Admitting: *Deleted

## 2017-09-16 DIAGNOSIS — C3490 Malignant neoplasm of unspecified part of unspecified bronchus or lung: Secondary | ICD-10-CM

## 2017-09-16 DIAGNOSIS — C3410 Malignant neoplasm of upper lobe, unspecified bronchus or lung: Secondary | ICD-10-CM

## 2017-09-16 MED ORDER — MORPHINE SULFATE ER 60 MG PO TBCR: 60.0000 mg | EXTENDED_RELEASE_TABLET | Freq: Two times a day (BID) | ORAL | 0 refills | Status: DC

## 2017-09-16 MED ORDER — MORPHINE SULFATE 30 MG PO TABS: 30.0000 mg | ORAL_TABLET | ORAL | 0 refills | Status: DC | PRN

## 2017-09-16 NOTE — Telephone Encounter (Signed)
Pt called with request for pain medication refill on morphine 30mg  tablet q 4hr, and Morphine 60mg  q12hr.  Pt advised she will pick up on thursday

## 2017-09-18 ENCOUNTER — Ambulatory Visit (HOSPITAL_COMMUNITY)
Admission: RE | Admit: 2017-09-18 | Discharge: 2017-09-18 | Disposition: A | Discharge status: Home / Self Care | Payer: Medicare Other | Source: Ambulatory Visit | Attending: Oncology | Admitting: Oncology

## 2017-09-18 DIAGNOSIS — I700 Atherosclerosis of aorta: Secondary | ICD-10-CM | POA: Diagnosis not present

## 2017-09-18 DIAGNOSIS — C3411 Malignant neoplasm of upper lobe, right bronchus or lung: Secondary | ICD-10-CM | POA: Diagnosis not present

## 2017-09-18 DIAGNOSIS — J439 Emphysema, unspecified: Secondary | ICD-10-CM | POA: Insufficient documentation

## 2017-09-18 DIAGNOSIS — Z85118 Personal history of other malignant neoplasm of bronchus and lung: Secondary | ICD-10-CM | POA: Diagnosis not present

## 2017-09-18 DIAGNOSIS — K6389 Other specified diseases of intestine: Secondary | ICD-10-CM | POA: Diagnosis not present

## 2017-09-18 MED ORDER — HEPARIN SOD (PORK) LOCK FLUSH 100 UNIT/ML IV SOLN
INTRAVENOUS | Status: AC
  Administered 2017-09-18: 500 [IU]
  Filled 2017-09-18: qty 5

## 2017-09-18 MED ORDER — HEPARIN SOD (PORK) LOCK FLUSH 100 UNIT/ML IV SOLN: 500.0000 [IU] | Freq: Once | INTRAVENOUS | Status: DC

## 2017-09-18 MED ORDER — IOHEXOL 300 MG/ML  SOLN
100.0000 mL | Freq: Once | INTRAMUSCULAR | Status: AC | PRN
  Administered 2017-09-18: 100 mL via INTRAVENOUS

## 2017-09-19 ENCOUNTER — Other Ambulatory Visit: Payer: Self-pay | Admitting: Internal Medicine

## 2017-09-19 DIAGNOSIS — C3411 Malignant neoplasm of upper lobe, right bronchus or lung: Secondary | ICD-10-CM

## 2017-09-19 DIAGNOSIS — C7931 Secondary malignant neoplasm of brain: Secondary | ICD-10-CM

## 2017-09-23 ENCOUNTER — Inpatient Hospital Stay: Payer: Medicare Other | Attending: Internal Medicine

## 2017-09-23 ENCOUNTER — Inpatient Hospital Stay (HOSPITAL_BASED_OUTPATIENT_CLINIC_OR_DEPARTMENT_OTHER): Payer: Medicare Other | Admitting: Oncology

## 2017-09-23 ENCOUNTER — Inpatient Hospital Stay: Payer: Medicare Other

## 2017-09-23 ENCOUNTER — Encounter: Payer: Self-pay | Admitting: Oncology

## 2017-09-23 ENCOUNTER — Telehealth: Payer: Self-pay

## 2017-09-23 VITALS — BP 134/60 | HR 66 | Temp 97.9°F | Resp 16 | Ht 62.5 in | Wt 112.7 lb

## 2017-09-23 DIAGNOSIS — Z5111 Encounter for antineoplastic chemotherapy: Secondary | ICD-10-CM | POA: Insufficient documentation

## 2017-09-23 DIAGNOSIS — C3411 Malignant neoplasm of upper lobe, right bronchus or lung: Secondary | ICD-10-CM | POA: Diagnosis not present

## 2017-09-23 DIAGNOSIS — Z23 Encounter for immunization: Secondary | ICD-10-CM | POA: Insufficient documentation

## 2017-09-23 DIAGNOSIS — C7931 Secondary malignant neoplasm of brain: Secondary | ICD-10-CM | POA: Diagnosis not present

## 2017-09-23 DIAGNOSIS — Z95828 Presence of other vascular implants and grafts: Secondary | ICD-10-CM

## 2017-09-23 DIAGNOSIS — C3490 Malignant neoplasm of unspecified part of unspecified bronchus or lung: Secondary | ICD-10-CM

## 2017-09-23 LAB — COMPREHENSIVE METABOLIC PANEL
ALBUMIN: 3.3 g/dL — AB (ref 3.5–5.0)
ALT: 9 U/L (ref 0–44)
ANION GAP: 9 (ref 5–15)
AST: 18 U/L (ref 15–41)
Alkaline Phosphatase: 74 U/L (ref 38–126)
BUN: 15 mg/dL (ref 8–23)
CHLORIDE: 104 mmol/L (ref 98–111)
CO2: 28 mmol/L (ref 22–32)
Calcium: 9.5 mg/dL (ref 8.9–10.3)
Creatinine, Ser: 0.9 mg/dL (ref 0.44–1.00)
GFR calc Af Amer: >60 mL/min (ref >60)
GFR calc non Af Amer: >60 mL/min (ref >60)
GLUCOSE: 158 mg/dL — AB (ref 70–99)
POTASSIUM: 3.9 mmol/L (ref 3.5–5.1)
Sodium: 141 mmol/L (ref 135–145)
Total Bilirubin: 0.4 mg/dL (ref 0.3–1.2)
Total Protein: 6.6 g/dL (ref 6.5–8.1)

## 2017-09-23 LAB — CBC WITH DIFFERENTIAL/PLATELET
BASOS ABS: 0 10*3/uL (ref 0.0–0.1)
BASOS PCT: 0 %
EOS PCT: 0 %
Eosinophils Absolute: 0 10*3/uL (ref 0.0–0.5)
HCT: 34.9 % (ref 34.8–46.6)
Hemoglobin: 11.7 g/dL (ref 11.6–15.9)
Lymphocytes Relative: 14 %
Lymphs Abs: 1.4 10*3/uL (ref 0.9–3.3)
MCH: 32.1 pg (ref 25.1–34.0)
MCHC: 33.4 g/dL (ref 31.5–36.0)
MCV: 96.1 fL (ref 79.5–101.0)
Monocytes Absolute: 0.9 10*3/uL (ref 0.1–0.9)
Monocytes Relative: 9 %
Neutro Abs: 8 10*3/uL — ABNORMAL HIGH (ref 1.5–6.5)
Neutrophils Relative %: 77 %
PLATELETS: 300 10*3/uL (ref 145–400)
RBC: 3.64 MIL/uL — ABNORMAL LOW (ref 3.70–5.45)
RDW: 13.7 % (ref 11.2–14.5)
WBC: 10.5 10*3/uL — ABNORMAL HIGH (ref 3.9–10.3)

## 2017-09-23 MED ORDER — HEPARIN SOD (PORK) LOCK FLUSH 100 UNIT/ML IV SOLN
500.0000 [IU] | Freq: Once | INTRAVENOUS | Status: DC | PRN
  Filled 2017-09-23: qty 5

## 2017-09-23 MED ORDER — DEXAMETHASONE SODIUM PHOSPHATE 10 MG/ML IJ SOLN
INTRAMUSCULAR | Status: AC
  Filled 2017-09-23: qty 1

## 2017-09-23 MED ORDER — SODIUM CHLORIDE 0.9 % IV SOLN
Freq: Once | INTRAVENOUS | Status: AC
  Administered 2017-09-23: 10:00:00 via INTRAVENOUS
  Filled 2017-09-23: qty 250

## 2017-09-23 MED ORDER — SODIUM CHLORIDE 0.9 % IJ SOLN
10.0000 mL | INTRAMUSCULAR | Status: DC | PRN
  Filled 2017-09-23: qty 10

## 2017-09-23 MED ORDER — INFLUENZA VAC SPLIT QUAD 0.5 ML IM SUSY
0.5000 mL | PREFILLED_SYRINGE | Freq: Once | INTRAMUSCULAR | Status: AC
  Administered 2017-09-23: 0.5 mL via INTRAMUSCULAR

## 2017-09-23 MED ORDER — ONDANSETRON HCL 8 MG PO TABS
8.0000 mg | ORAL_TABLET | Freq: Once | ORAL | Status: AC
  Administered 2017-09-23: 8 mg via ORAL

## 2017-09-23 MED ORDER — DEXAMETHASONE SODIUM PHOSPHATE 10 MG/ML IJ SOLN
10.0000 mg | Freq: Once | INTRAMUSCULAR | Status: AC
  Administered 2017-09-23: 10 mg via INTRAVENOUS

## 2017-09-23 MED ORDER — ONDANSETRON HCL 8 MG PO TABS
ORAL_TABLET | ORAL | Status: AC
  Filled 2017-09-23: qty 1

## 2017-09-23 MED ORDER — SODIUM CHLORIDE 0.9% FLUSH
10.0000 mL | Freq: Once | INTRAVENOUS | Status: AC
  Administered 2017-09-23: 10 mL
  Filled 2017-09-23: qty 10

## 2017-09-23 MED ORDER — SODIUM CHLORIDE 0.9 % IV SOLN
500.0000 mg/m2 | Freq: Once | INTRAVENOUS | Status: AC
  Administered 2017-09-23: 800 mg via INTRAVENOUS
  Filled 2017-09-23: qty 20

## 2017-09-23 MED ORDER — INFLUENZA VAC SPLIT QUAD 0.5 ML IM SUSY
PREFILLED_SYRINGE | INTRAMUSCULAR | Status: AC
  Filled 2017-09-23: qty 0.5

## 2017-09-23 NOTE — Progress Notes (Signed)
Danielle [name] OFFICE PROGRESS NOTE  [name], MD [place] [id number]  DIAGNOSIS:Metastatic non-small cell lung cancer initially diagnosed as locally advanced stage IIIB with a right Pancoast tumor involving the vertebral body as well as prominent canal invasion with spinal cord compression in October 2007. The patient also has metastatic disease to the brain in April 2008.  PRIOR THERAPY: 1. Status post concurrent chemoradiation with weekly carboplatin and paclitaxel, last dose was given November 18, 2005. 2. 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 3. Status post gamma knife radiotherapy to a solitary brain lesion located in the superior frontal area of the brain at [facility] in April of 2008. 4. Status post palliative radiotherapy to the lateral abdominal wall metastatic lesion under the care of Dr. Lisbeth Renshaw, completed March of 2009. 5. Status post 6 cycles of systemic chemotherapy with carboplatin and Alimta. Last dose was given July 26, 2007 with disease stabilization. 6. Gamma knife stereotactic radiotherapy to 2 brain lesions one involving the right frontal dural based as well as right parietal lesion performed on 05/07/2012 under the care of Dr. Vallarie Mare at [facility].  CURRENT THERAPY: Maintenance systemic chemotherapy with Alimta 500 MG/M2 every 3 weeks, status post 174cycles.  INTERVAL HISTORY: Danielle [name] 67 y.o. female returns for routine follow-up visit accompanied by her daughter.  The patient is feeling fine today and has no specific complaints.  She continues to tolerate treatment with Alimta fairly well.  She denies fevers and chills.  Denies chest pain, shortness of breath, cough, hemoptysis.  She reports intermittent nausea but no vomiting.  Denies diarrhea and constipation.  Denies recent weight loss night sweats.  The patient is here for  evaluation prior to cycle #175 of her treatment and to review her restaging CT scan results.  MEDICAL HISTORY: Past Medical History:  Diagnosis Date  . Anemia   . 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 CVA (cerebral vascular accident) ([place]) 04/2014   pt states had 2 cva's within 2 wks  . DJD (degenerative joint disease), cervical   . Fibromyalgia   . H/O: pneumonia   . History of tobacco abuse quit 10/08   on nicotine patch  . Hypertension   . Hypokalemia   . Lung cancer ([place]) dx'd 09/2005   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 had chemo and radiation for lung ca  . Thrush 12/11/2010    ALLERGIES:  is allergic to contrast media [iodinated diagnostic agents]; iohexol; sulfa drugs cross reactors; and co-trimoxazole injection [sulfamethoxazole-trimethoprim].  MEDICATIONS:  Current Outpatient Medications  Medication Sig Dispense Refill  . amLODipine (NORVASC) 10 MG tablet TAKE 1 TABLET (10 MG TOTAL) BY MOUTH DAILY. 90 tablet 1  . aspirin EC 325 MG tablet TAKE 1 TABLET BY MOUTH EVERY DAY 90 tablet 0  . atorvastatin (LIPITOR) 40 MG tablet TAKE 1 TABLET BY MOUTH EVERY DAY AT 6 PM 90 tablet 3  . diphenhydrAMINE (BENADRYL) 25 MG tablet Take 1 tablet (25 mg total) by mouth as directed. 2 hours before CT scan. 5 tablet 0  . FeFum-FePoly-FA-B Cmp-C-Biot (INTEGRA PLUS) CAPS Take 1 capsule by mouth every morning. 90 capsule 2  . fluticasone (FLONASE) 50 MCG/ACT nasal spray Place 1-2 sprays into both nostrils daily as needed. 16 g 5  . folic acid (FOLVITE) 1 MG tablet TAKE 1 TABLET BY MOUTH EVERY DAY 90 tablet 1  . furosemide (LASIX) 20  MG tablet TAKE 1 TABLET (20 MG TOTAL) BY MOUTH DAILY. AS NEEDED FOR SWELLING. 20 tablet 0  . lacosamide (VIMPAT) 50 MG TABS tablet Take by mouth.    . lidocaine-prilocaine (EMLA) cream Apply to Port-A-Cath as directed 1-2 hours prior to chemotherapy 30 g 1  . lisinopril (PRINIVIL,ZESTRIL) 20 MG tablet TAKE 2 TABLETS (40 MG TOTAL) BY MOUTH  DAILY. 180 tablet 1  . morphine (MS CONTIN) 60 MG 12 hr tablet Take 1 tablet (60 mg total) by mouth 2 (two) times daily. 60 tablet 0  . morphine (MSIR) 30 MG tablet Take 1 tablet (30 mg total) by mouth every 4 (four) hours as needed (breakthrough pain). 60 tablet 0  . Olopatadine HCl 0.2 % SOLN Apply 1 drop to eye daily. 2.5 mL 1  . ondansetron (ZOFRAN) 8 MG tablet TAKE 1 TABLET BY MOUTH EVERY 8 HOURS AS NEEDED FOR FOR NAUSEA OR VOMITING 30 tablet 0  . predniSONE (DELTASONE) 50 MG tablet 1 tablet by mouth 13 hours ,7 hours and 1 hour  before CT scan 3 tablet 3  . triamcinolone (KENALOG) 0.025 % ointment Apply 1 application topically 2 (two) times daily. 30 g 0   No current facility-administered medications for this visit.     SURGICAL HISTORY:  Past Surgical History:  Procedure Laterality Date  . CERVICAL LAMINECTOMY  1995  . KNEE SURGERY  1990   Left x 2  . 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 LOOP RECORDER IMPLANT N/A 04/19/2014   Procedure: LOOP RECORDER IMPLANT;  Surgeon: Thompson Grayer, MD;  Location: [facility] CATH LAB;  Service: Cardiovascular;  Laterality: N/A;  . OTHER SURGICAL HISTORY  2008   Gamma knife surgery to remove brain met   . PORTACATH PLACEMENT  -[name]   TIP IN CAVOATRIAL JUNCTION  . TEE WITHOUT CARDIOVERSION N/A 04/19/2014   Procedure: TRANSESOPHAGEAL ECHOCARDIOGRAM (TEE);  Surgeon: Larey Dresser, MD;  Location: [place];  Service: Cardiovascular;  Laterality: N/A;    REVIEW OF SYSTEMS:   Review of Systems  Constitutional: Negative for appetite change, chills, fatigue, fever and unexpected weight change.  HENT:   Negative for mouth sores, nosebleeds, sore throat and trouble swallowing.   Eyes: Negative for eye problems and icterus.  Respiratory: Negative for cough, hemoptysis, shortness of breath and wheezing.   Cardiovascular: Negative for chest pain and leg swelling.  Gastrointestinal: Negative for abdominal pain, constipation,  diarrhea, and vomiting.  Positive for intermittent nausea. Genitourinary: Negative for bladder incontinence, difficulty urinating, dysuria, frequency and hematuria.   Musculoskeletal: Negative for back pain, gait problem, neck pain and neck stiffness.  Skin: Negative for itching and rash.  Neurological: Negative for dizziness, extremity weakness, gait problem, headaches, light-headedness and seizures.  Hematological: Negative for adenopathy. Does not bruise/bleed easily.  Psychiatric/Behavioral: Negative for confusion, depression and sleep disturbance. The patient is not nervous/anxious.     PHYSICAL EXAMINATION:  Blood pressure 134/60, pulse 66, temperature 97.9 F (36.6 C), temperature source Oral, resp. rate 16, height 5' 2.5" (1.588 m), weight 112 lb 11.2 oz (51.1 kg), SpO2 100 %.  ECOG PERFORMANCE STATUS: 1 - Symptomatic but completely ambulatory  Physical Exam  Constitutional: Oriented to person, place, and time and well-developed, well-nourished, and in no distress. No distress.  HENT:  Head: Normocephalic and atraumatic.  Mouth/Throat: Oropharynx is clear and moist. No oropharyngeal exudate.  Eyes: Conjunctivae are normal. Right eye exhibits no discharge. Left eye exhibits no discharge. No  scleral icterus.  Neck: Normal range of motion. Neck supple.  Cardiovascular: Normal rate, regular rhythm, normal heart sounds and intact distal pulses.   Pulmonary/Chest: Effort normal and breath sounds normal. No respiratory distress. No wheezes. No rales.  Abdominal: Soft. Bowel sounds are normal. Exhibits no distension and no mass. There is no tenderness.  Musculoskeletal: Normal range of motion. Exhibits no edema.  Lymphadenopathy:    No cervical adenopathy.  Neurological: Alert and oriented to person, place, and time. Exhibits normal muscle tone. Gait normal. Coordination normal.  Skin: Skin is warm and dry. No rash noted. Not diaphoretic. No erythema. No pallor.  Psychiatric: Mood,  memory and judgment normal.  Vitals reviewed.  LABORATORY DATA: Lab Results  Component Value Date   WBC 10.5 (H) 09/23/2017   HGB 11.7 09/23/2017   HCT 34.9 09/23/2017   MCV 96.1 09/23/2017   PLT 300 09/23/2017      Chemistry      Component Value Date/Time   NA 141 09/23/2017 0736   NA 142 12/24/2016 0816   K 3.9 09/23/2017 0736   K 4.4 12/24/2016 0816   CL 104 09/23/2017 0736   CL 106 06/30/2012 1004   CO2 28 09/23/2017 0736   CO2 28 12/24/2016 0816   BUN 15 09/23/2017 0736   BUN 15.8 12/24/2016 0816   CREATININE 0.90 09/23/2017 0736   CREATININE 0.87 07/22/2017 0826   CREATININE 1.1 12/24/2016 0816      Component Value Date/Time   CALCIUM 9.5 09/23/2017 0736   CALCIUM 9.6 12/24/2016 0816   ALKPHOS 74 09/23/2017 0736   ALKPHOS 80 12/24/2016 0816   AST 18 09/23/2017 0736   AST 20 07/22/2017 0826   AST 20 12/24/2016 0816   ALT 9 09/23/2017 0736   ALT 11 07/22/2017 0826   ALT 11 12/24/2016 0816   BILITOT 0.4 09/23/2017 0736   BILITOT 0.4 07/22/2017 0826   BILITOT 0.34 12/24/2016 0816       RADIOGRAPHIC STUDIES:  Ct Chest W Contrast  Result Date: 09/18/2017 CLINICAL DATA:  Patient with history of right lung cancer. Follow-up exam. EXAM: CT CHEST, ABDOMEN, AND PELVIS WITH CONTRAST TECHNIQUE: Multidetector CT imaging of the chest, abdomen and pelvis was performed following the standard protocol during bolus administration of intravenous contrast. CONTRAST:  113m OMNIPAQUE IOHEXOL 300 MG/ML  SOLN COMPARISON:  CT CAP 05/19/2017 FINDINGS: CT CHEST FINDINGS Cardiovascular: Right anterior chest wall Port-A-Cath is present with tip terminating in the superior vena cava. Normal heart size. Coronary arterial and thoracic aortic vascular calcifications. Small amount of fluid in the superior pericardial recess. Mediastinum/Nodes: No enlarged axillary, mediastinal or hilar lymphadenopathy. Normal appearance of the esophagus. Lungs/Pleura: Debris within the right lower lobe  bronchi. Stable small right pleural effusion and right greater than left basilar scarring. Stable chronic postradiation changes within the medial aspect of the right upper lobe extending into the right lung apex with associated pleuroparenchymal thickening and nodular distortion. No new or enlarging pulmonary nodules. Centrilobular and paraseptal emphysematous change. No pneumothorax. Musculoskeletal: Unchanged T3 compression fracture with vertebra plana appearance. Vertebroplasty material within the T4-T7 vertebral bodies. CT ABDOMEN PELVIS FINDINGS Hepatobiliary: The liver is normal in size and contour. No focal hepatic lesion is identified. Normal gallbladder. No intrahepatic or extrahepatic biliary ductal dilatation. Pancreas: Unremarkable Spleen: Unremarkable Adrenals/Urinary Tract: Normal adrenal glands. Kidneys enhance symmetrically with contrast. Urinary bladder is unremarkable. Stomach/Bowel: Normal morphology of the stomach. No evidence for bowel obstruction. Mild wall thickening of the sigmoid colon and rectum. Stool throughout  the colon. Vascular/Lymphatic: Normal caliber abdominal aorta. Peripheral calcified atherosclerotic plaque. No retroperitoneal lymphadenopathy. Reproductive: Uterus and adnexal structures unremarkable. Other: None. Musculoskeletal: No aggressive or acute appearing osseous lesions. Lumbar spine degenerative changes. IMPRESSION: 1. Stable post radiation changes right hemithorax. No evidence to suggest localized recurrence. 2. Mild wall thickening of the sigmoid colon and rectum raising the possibility of colitis. Stool throughout the colon as can be seen with constipation. 3. Aortic Atherosclerosis (ICD10-I70.0) and Emphysema (ICD10-J43.9). Electronically Signed   By: Lovey Newcomer M.D.   On: 09/18/2017 14:24   Ct Abdomen Pelvis W Contrast  Result Date: 09/18/2017 CLINICAL DATA:  Patient with history of right lung cancer. Follow-up exam. EXAM: CT CHEST, ABDOMEN, AND PELVIS WITH  CONTRAST TECHNIQUE: Multidetector CT imaging of the chest, abdomen and pelvis was performed following the standard protocol during bolus administration of intravenous contrast. CONTRAST:  127m OMNIPAQUE IOHEXOL 300 MG/ML  SOLN COMPARISON:  CT CAP 05/19/2017 FINDINGS: CT CHEST FINDINGS Cardiovascular: Right anterior chest wall Port-A-Cath is present with tip terminating in the superior vena cava. Normal heart size. Coronary arterial and thoracic aortic vascular calcifications. Small amount of fluid in the superior pericardial recess. Mediastinum/Nodes: No enlarged axillary, mediastinal or hilar lymphadenopathy. Normal appearance of the esophagus. Lungs/Pleura: Debris within the right lower lobe bronchi. Stable small right pleural effusion and right greater than left basilar scarring. Stable chronic postradiation changes within the medial aspect of the right upper lobe extending into the right lung apex with associated pleuroparenchymal thickening and nodular distortion. No new or enlarging pulmonary nodules. Centrilobular and paraseptal emphysematous change. No pneumothorax. Musculoskeletal: Unchanged T3 compression fracture with vertebra plana appearance. Vertebroplasty material within the T4-T7 vertebral bodies. CT ABDOMEN PELVIS FINDINGS Hepatobiliary: The liver is normal in size and contour. No focal hepatic lesion is identified. Normal gallbladder. No intrahepatic or extrahepatic biliary ductal dilatation. Pancreas: Unremarkable Spleen: Unremarkable Adrenals/Urinary Tract: Normal adrenal glands. Kidneys enhance symmetrically with contrast. Urinary bladder is unremarkable. Stomach/Bowel: Normal morphology of the stomach. No evidence for bowel obstruction. Mild wall thickening of the sigmoid colon and rectum. Stool throughout the colon. Vascular/Lymphatic: Normal caliber abdominal aorta. Peripheral calcified atherosclerotic plaque. No retroperitoneal lymphadenopathy. Reproductive: Uterus and adnexal structures  unremarkable. Other: None. Musculoskeletal: No aggressive or acute appearing osseous lesions. Lumbar spine degenerative changes. IMPRESSION: 1. Stable post radiation changes right hemithorax. No evidence to suggest localized recurrence. 2. Mild wall thickening of the sigmoid colon and rectum raising the possibility of colitis. Stool throughout the colon as can be seen with constipation. 3. Aortic Atherosclerosis (ICD10-I70.0) and Emphysema (ICD10-J43.9). Electronically Signed   By: DLovey NewcomerM.D.   On: 09/18/2017 14:24     ASSESSMENT/PLAN:  Cancer of upper lobe of right lung This is a very pleasant 67year old white female with metastatic non-small cell lung cancer diagnosed in October 2007 status post concurrent chemoradiation followed by consolidation chemotherapy and she is currently on maintenance treatment with single agent Alimta status post 174cycles. She had a restaging CT scan and is here to discuss the results.  The patient was seen with Dr. MJulien Nordmann  CT scan results were discussed with the patient and her daughter which showed no evidence of disease progression.  Recommend for her to continue on Alimta.  She will proceed with cycle #175 today as scheduled.  The patient is in agreement to continuing her treatment. She will follow-up in 3 weeks for evaluation prior to cycle #176.  For pain, the patient will continue on MS Contin and MSIR for  breakthrough pain.  She was advised to call immediately if she has any concerning symptoms in the interval. The patient voices understanding of current disease status and treatment options and is in agreement with the current care plan. All questions were answered. The patient knows to call the clinic with any problems, questions or concerns. We can certainly see the patient much sooner if necessary.   No orders of the defined types were placed in this encounter.    Mikey Bussing, DNP, AGPCNP-BC, AOCNP 09/23/17    ADDENDUM: Hematology/Oncology Attending: I had a face-to-face encounter with the patient.  I recommended her care plan.  This is a very pleasant 67 years old white female with metastatic non-small cell lung cancer diagnosed in October 2007 status post concurrent chemoradiation followed by consolidation chemotherapy followed by disease progression and induction systemic chemotherapy with carboplatin and Alimta.  She has been on maintenance treatment with single Alimta now for several years and status post 174 cycles.  The patient has been tolerating this treatment fairly well. She had repeat CT scan of the chest, abdomen and pelvis performed recently.  I personally and independently reviewed the scan images and discussed the results with the patient and her daughter today. The patient was given the option of continuing her treatment with maintenance Alimta versus stopping the treatment at this point and close monitoring.  The patient does not want to stop her treatment has she is tolerating it well and she is concerned about disease progression. She will proceed with cycle #175 today as scheduled. For pain management she will continue her current pain medication with MS Contin and morphine sulfate. She will come back for follow-up visit in 3 weeks for evaluation before starting cycle 176. The patient was advised to call immediately if she has any concerning symptoms in the interval.  Disclaimer: This note was dictated with voice recognition software. Similar sounding words can inadvertently be transcribed and may be missed upon review. Eilleen Kempf, MD 09/24/17

## 2017-09-23 NOTE — Patient Instructions (Signed)
Danielle [name] Discharge Instructions for Patients Receiving Chemotherapy  Today you received the following chemotherapy agents Alimta   To help prevent nausea and vomiting after your treatment, we encourage you to take your nausea medication as directed.    If you develop nausea and vomiting that is not controlled by your nausea medication, call the clinic.   BELOW ARE SYMPTOMS THAT SHOULD BE REPORTED IMMEDIATELY:  *FEVER GREATER THAN 100.5 F  *CHILLS WITH OR WITHOUT FEVER  NAUSEA AND VOMITING THAT IS NOT CONTROLLED WITH YOUR NAUSEA MEDICATION  *UNUSUAL SHORTNESS OF BREATH  *UNUSUAL BRUISING OR BLEEDING  TENDERNESS IN MOUTH AND THROAT WITH OR WITHOUT PRESENCE OF ULCERS  *URINARY PROBLEMS  *BOWEL PROBLEMS  UNUSUAL RASH Items with * indicate a potential emergency and should be followed up as soon as possible.  Feel free to call the clinic should you have any questions or concerns. The clinic phone number is [phone].  Please show the [name] at check-in to the Emergency Department and triage nurse.

## 2017-09-23 NOTE — Assessment & Plan Note (Addendum)
This is a very pleasant 67 year old white female with metastatic non-small cell lung cancer diagnosed in October 2007 status post concurrent chemoradiation followed by consolidation chemotherapy and she is currently on maintenance treatment with single agent Alimta status post 174cycles. She had a restaging CT scan and is here to discuss the results.  The patient was seen with Dr. Julien Nordmann.  CT scan results were discussed with the patient and her daughter which showed no evidence of disease progression.  Recommend for her to continue on Alimta.  She will proceed with cycle #175 today as scheduled.  The patient is in agreement to continuing her treatment. She will follow-up in 3 weeks for evaluation prior to cycle #176.  For pain, the patient will continue on MS Contin and MSIR for breakthrough pain.  She was advised to call immediately if she has any concerning symptoms in the interval. The patient voices understanding of current disease status and treatment options and is in agreement with the current care plan. All questions were answered. The patient knows to call the clinic with any problems, questions or concerns. We can certainly see the patient much sooner if necessary.

## 2017-09-23 NOTE — Telephone Encounter (Signed)
Printed avs and calender of upcoming appointments that was already scheduled. Per 9/17 los

## 2017-10-14 ENCOUNTER — Encounter: Payer: Self-pay | Admitting: Internal Medicine

## 2017-10-14 ENCOUNTER — Inpatient Hospital Stay (HOSPITAL_BASED_OUTPATIENT_CLINIC_OR_DEPARTMENT_OTHER): Payer: Medicare Other | Admitting: Internal Medicine

## 2017-10-14 ENCOUNTER — Inpatient Hospital Stay: Payer: Medicare Other | Attending: Internal Medicine

## 2017-10-14 ENCOUNTER — Inpatient Hospital Stay: Payer: Medicare Other

## 2017-10-14 VITALS — BP 116/64 | HR 74 | Temp 98.1°F | Resp 18 | Ht 62.5 in | Wt 110.5 lb

## 2017-10-14 DIAGNOSIS — C3411 Malignant neoplasm of upper lobe, right bronchus or lung: Secondary | ICD-10-CM | POA: Diagnosis not present

## 2017-10-14 DIAGNOSIS — Z5111 Encounter for antineoplastic chemotherapy: Secondary | ICD-10-CM | POA: Diagnosis not present

## 2017-10-14 DIAGNOSIS — C3490 Malignant neoplasm of unspecified part of unspecified bronchus or lung: Secondary | ICD-10-CM

## 2017-10-14 DIAGNOSIS — C3410 Malignant neoplasm of upper lobe, unspecified bronchus or lung: Secondary | ICD-10-CM

## 2017-10-14 DIAGNOSIS — Z79899 Other long term (current) drug therapy: Secondary | ICD-10-CM | POA: Insufficient documentation

## 2017-10-14 DIAGNOSIS — Z95828 Presence of other vascular implants and grafts: Secondary | ICD-10-CM

## 2017-10-14 DIAGNOSIS — C7931 Secondary malignant neoplasm of brain: Secondary | ICD-10-CM

## 2017-10-14 LAB — COMPREHENSIVE METABOLIC PANEL
ALBUMIN: 3.3 g/dL — AB (ref 3.5–5.0)
ALT: 11 U/L (ref 0–44)
ANION GAP: 10 (ref 5–15)
AST: 19 U/L (ref 15–41)
Alkaline Phosphatase: 72 U/L (ref 38–126)
BUN: 10 mg/dL (ref 8–23)
CALCIUM: 9.4 mg/dL (ref 8.9–10.3)
CO2: 28 mmol/L (ref 22–32)
Chloride: 104 mmol/L (ref 98–111)
Creatinine, Ser: 0.88 mg/dL (ref 0.44–1.00)
Glucose, Bld: 189 mg/dL — ABNORMAL HIGH (ref 70–99)
Potassium: 3.7 mmol/L (ref 3.5–5.1)
Sodium: 142 mmol/L (ref 135–145)
TOTAL PROTEIN: 6.6 g/dL (ref 6.5–8.1)
Total Bilirubin: 0.5 mg/dL (ref 0.3–1.2)

## 2017-10-14 LAB — CBC WITH DIFFERENTIAL/PLATELET
BASOS ABS: 0 10*3/uL (ref 0.0–0.1)
BASOS PCT: 0 %
EOS ABS: 0.1 10*3/uL (ref 0.0–0.5)
EOS PCT: 1 %
HCT: 36 % (ref 34.8–46.6)
Hemoglobin: 11.6 g/dL (ref 11.6–15.9)
Lymphocytes Relative: 15 %
Lymphs Abs: 1.4 10*3/uL (ref 0.9–3.3)
MCH: 31.7 pg (ref 25.1–34.0)
MCHC: 32.2 g/dL (ref 31.5–36.0)
MCV: 98.4 fL (ref 79.5–101.0)
Monocytes Absolute: 0.8 10*3/uL (ref 0.1–0.9)
Monocytes Relative: 9 %
NEUTROS PCT: 75 %
NRBC: 0 % (ref 0.0–0.2)
Neutro Abs: 7.1 10*3/uL — ABNORMAL HIGH (ref 1.5–6.5)
PLATELETS: 343 10*3/uL (ref 145–400)
RBC: 3.66 MIL/uL — AB (ref 3.70–5.45)
RDW: 13.9 % (ref 11.2–14.5)
WBC: 9.3 10*3/uL (ref 3.9–10.3)

## 2017-10-14 LAB — RAPID URINE DRUG SCREEN, HOSP PERFORMED
AMPHETAMINES: NOT DETECTED
BENZODIAZEPINES: NOT DETECTED
Barbiturates: NOT DETECTED
COCAINE: NOT DETECTED
Opiates: POSITIVE — AB
Tetrahydrocannabinol: POSITIVE — AB

## 2017-10-14 MED ORDER — SODIUM CHLORIDE 0.9 % IJ SOLN
10.0000 mL | INTRAMUSCULAR | Status: DC | PRN
  Administered 2017-10-14: 10 mL
  Filled 2017-10-14: qty 10

## 2017-10-14 MED ORDER — SODIUM CHLORIDE 0.9% FLUSH
10.0000 mL | Freq: Once | INTRAVENOUS | Status: AC
  Administered 2017-10-14: 10 mL
  Filled 2017-10-14: qty 10

## 2017-10-14 MED ORDER — ONDANSETRON HCL 8 MG PO TABS: 8.0000 mg | ORAL_TABLET | Freq: Once | ORAL | Status: DC

## 2017-10-14 MED ORDER — HEPARIN SOD (PORK) LOCK FLUSH 100 UNIT/ML IV SOLN
500.0000 [IU] | Freq: Once | INTRAVENOUS | Status: AC | PRN
  Administered 2017-10-14: 500 [IU]
  Filled 2017-10-14: qty 5

## 2017-10-14 MED ORDER — MORPHINE SULFATE ER 60 MG PO TBCR: 60.0000 mg | EXTENDED_RELEASE_TABLET | Freq: Two times a day (BID) | ORAL | 0 refills | Status: DC

## 2017-10-14 MED ORDER — ONDANSETRON HCL 8 MG PO TABS
ORAL_TABLET | ORAL | Status: AC
  Filled 2017-10-14: qty 1

## 2017-10-14 MED ORDER — DEXAMETHASONE SODIUM PHOSPHATE 10 MG/ML IJ SOLN
INTRAMUSCULAR | Status: AC
  Filled 2017-10-14: qty 1

## 2017-10-14 MED ORDER — SODIUM CHLORIDE 0.9 % IV SOLN
Freq: Once | INTRAVENOUS | Status: AC
  Administered 2017-10-14: 10:00:00 via INTRAVENOUS
  Filled 2017-10-14: qty 250

## 2017-10-14 MED ORDER — MORPHINE SULFATE 30 MG PO TABS: 30.0000 mg | ORAL_TABLET | ORAL | 0 refills | Status: DC | PRN

## 2017-10-14 MED ORDER — DEXAMETHASONE SODIUM PHOSPHATE 10 MG/ML IJ SOLN
10.0000 mg | Freq: Once | INTRAMUSCULAR | Status: AC
  Administered 2017-10-14: 10 mg via INTRAVENOUS

## 2017-10-14 MED ORDER — SODIUM CHLORIDE 0.9 % IV SOLN
500.0000 mg/m2 | Freq: Once | INTRAVENOUS | Status: AC
  Administered 2017-10-14: 800 mg via INTRAVENOUS
  Filled 2017-10-14: qty 12

## 2017-10-14 NOTE — Patient Instructions (Signed)
[place] [facility] Discharge Instructions for Patients Receiving Chemotherapy  Today you received the following chemotherapy agents: Alimta  To help prevent nausea and vomiting after your treatment, we encourage you to take your nausea medication as directed.   If you develop nausea and vomiting that is not controlled by your nausea medication, call the clinic.   BELOW ARE SYMPTOMS THAT SHOULD BE REPORTED IMMEDIATELY:  *FEVER GREATER THAN 100.5 F  *CHILLS WITH OR WITHOUT FEVER  NAUSEA AND VOMITING THAT IS NOT CONTROLLED WITH YOUR NAUSEA MEDICATION  *UNUSUAL SHORTNESS OF BREATH  *UNUSUAL BRUISING OR BLEEDING  TENDERNESS IN MOUTH AND THROAT WITH OR WITHOUT PRESENCE OF ULCERS  *URINARY PROBLEMS  *BOWEL PROBLEMS  UNUSUAL RASH Items with * indicate a potential emergency and should be followed up as soon as possible.  Feel free to call the clinic should you have any questions or concerns. The clinic phone number is [phone].  Please show the [facility] at check-in to the Emergency Department and triage nurse.

## 2017-10-14 NOTE — Progress Notes (Signed)
[facility] [phone]   [phone]  OFFICE PROGRESS NOTE  [name], MD [place] [address]  DIAGNOSIS: Metastatic non-small cell lung cancer initially diagnosed as locally advanced stage IIIB with a right Pancoast tumor involving the vertebral body as well as prominent canal invasion with spinal cord compression in October 2007. The patient also has metastatic disease to the brain in April 2008.  PRIOR THERAPY: 1. Status post concurrent chemoradiation with weekly carboplatin and paclitaxel, last dose was given November 18, 2005. 2. 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 3. Status post gamma knife radiotherapy to a solitary brain lesion located in the superior frontal area of the brain at [facility] in April of 2008. 4. Status post palliative radiotherapy to the lateral abdominal wall metastatic lesion under the care of Dr. Lisbeth Renshaw, completed March of 2009. 5. Status post 6 cycles of systemic chemotherapy with carboplatin and Alimta. Last dose was given July 26, 2007 with disease stabilization. 6. Gamma knife stereotactic radiotherapy to 2 brain lesions one involving the right frontal dural based as well as right parietal lesion performed on 05/07/2012 under the care of Dr. Vallarie Mare at [facility].  CURRENT THERAPY: Maintenance systemic chemotherapy with Alimta 500 MG/M2 every 3 weeks, status post 175 cycles.  INTERVAL HISTORY: Danielle [name] 67 y.o. female returns to the clinic today for follow-up visit accompanied by her daughter.  The patient is feeling fine today with no concerning complaints.  She denied having any chest pain, shortness of breath, cough or hemoptysis.  She denied having any fever or chills.  She has no nausea, vomiting, diarrhea or constipation.  She has no recent weight loss or night sweats.  She is here today  for evaluation before starting cycle #176.  She is requesting refill of her pain medication.  MEDICAL HISTORY: Past Medical History:  Diagnosis Date  . Anemia   . 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 CVA (cerebral vascular accident) ([place]) 04/2014   pt states had 2 cva's within 2 wks  . DJD (degenerative joint disease), cervical   . Fibromyalgia   . H/O: pneumonia   . History of tobacco abuse quit 10/08   on nicotine patch  . Hypertension   . Hypokalemia   . Lung cancer ([place]) dx'd 09/2005   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 had chemo and radiation for lung ca  . Thrush 12/11/2010    ALLERGIES:  is allergic to contrast media [iodinated diagnostic agents]; iohexol; sulfa drugs cross reactors; and co-trimoxazole injection [sulfamethoxazole-trimethoprim].  MEDICATIONS:  Current Outpatient Medications  Medication Sig Dispense Refill  . amLODipine (NORVASC) 10 MG tablet TAKE 1 TABLET (10 MG TOTAL) BY MOUTH DAILY. 90 tablet 1  . aspirin EC 325 MG tablet TAKE 1 TABLET BY MOUTH EVERY DAY 90 tablet 0  . atorvastatin (LIPITOR) 40 MG tablet TAKE 1 TABLET BY MOUTH EVERY DAY AT 6 PM 90 tablet 3  . diphenhydrAMINE (BENADRYL) 25 MG tablet Take 1 tablet (25 mg total) by mouth as directed. 2 hours before CT scan. 5 tablet 0  . FeFum-FePoly-FA-B Cmp-C-Biot (INTEGRA PLUS) CAPS Take 1 capsule by mouth every morning. 90 capsule 2  . fluticasone (FLONASE) 50 MCG/ACT nasal spray Place 1-2 sprays into both nostrils daily as needed. 16 g 5  . folic acid (FOLVITE) 1 MG tablet TAKE 1 TABLET BY MOUTH EVERY DAY  90 tablet 1  . furosemide (LASIX) 20 MG tablet TAKE 1 TABLET (20 MG TOTAL) BY MOUTH DAILY. AS NEEDED FOR SWELLING. 20 tablet 0  . lacosamide (VIMPAT) 50 MG TABS tablet Take by mouth.    . lidocaine-prilocaine (EMLA) cream Apply to Port-A-Cath as directed 1-2 hours prior to chemotherapy 30 g 1  . lisinopril (PRINIVIL,ZESTRIL) 20 MG tablet TAKE 2 TABLETS (40 MG TOTAL) BY MOUTH  DAILY. 180 tablet 1  . morphine (MS CONTIN) 60 MG 12 hr tablet Take 1 tablet (60 mg total) by mouth 2 (two) times daily. 60 tablet 0  . morphine (MSIR) 30 MG tablet Take 1 tablet (30 mg total) by mouth every 4 (four) hours as needed (breakthrough pain). 60 tablet 0  . Olopatadine HCl 0.2 % SOLN Apply 1 drop to eye daily. 2.5 mL 1  . ondansetron (ZOFRAN) 8 MG tablet TAKE 1 TABLET BY MOUTH EVERY 8 HOURS AS NEEDED FOR FOR NAUSEA OR VOMITING 30 tablet 0  . predniSONE (DELTASONE) 50 MG tablet 1 tablet by mouth 13 hours ,7 hours and 1 hour  before CT scan 3 tablet 3  . triamcinolone (KENALOG) 0.025 % ointment Apply 1 application topically 2 (two) times daily. 30 g 0   No current facility-administered medications for this visit.     SURGICAL HISTORY:  Past Surgical History:  Procedure Laterality Date  . CERVICAL LAMINECTOMY  1995  . KNEE SURGERY  1990   Left x 2  . 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 LOOP RECORDER IMPLANT N/A 04/19/2014   Procedure: LOOP RECORDER IMPLANT;  Surgeon: Thompson Grayer, MD;  Location: [facility] CATH LAB;  Service: Cardiovascular;  Laterality: N/A;  . OTHER SURGICAL HISTORY  2008   Gamma knife surgery to remove brain met   . PORTACATH PLACEMENT  -[name]   TIP IN CAVOATRIAL JUNCTION  . TEE WITHOUT CARDIOVERSION N/A 04/19/2014   Procedure: TRANSESOPHAGEAL ECHOCARDIOGRAM (TEE);  Surgeon: Larey Dresser, MD;  Location: [facility] ENDOSCOPY;  Service: Cardiovascular;  Laterality: N/A;    REVIEW OF SYSTEMS:  A comprehensive review of systems was negative.   PHYSICAL EXAMINATION: General appearance: alert, cooperative and no distress Head: Normocephalic, without obvious abnormality, atraumatic Neck: no adenopathy, no JVD, supple, symmetrical, trachea midline and thyroid not enlarged, symmetric, no tenderness/mass/nodules Lymph nodes: Cervical, supraclavicular, and axillary nodes normal. Resp: clear to auscultation bilaterally Back: symmetric, no  curvature. ROM normal. No CVA tenderness. Cardio: regular rate and rhythm, S1, S2 normal, no murmur, click, rub or gallop GI: soft, non-tender; bowel sounds normal; no masses,  no organomegaly Extremities: extremities normal, atraumatic, no cyanosis or edema  ECOG PERFORMANCE STATUS: 1 - Symptomatic but completely ambulatory  Blood pressure 116/64, pulse 74, temperature 98.1 F (36.7 C), temperature source Oral, resp. rate 18, height 5' 2.5" (1.588 m), weight 110 lb 8 oz (50.1 kg), SpO2 99 %.  LABORATORY DATA: Lab Results  Component Value Date   WBC 10.5 (H) 09/23/2017   HGB 11.7 09/23/2017   HCT 34.9 09/23/2017   MCV 96.1 09/23/2017   PLT 300 09/23/2017      Chemistry      Component Value Date/Time   NA 141 09/23/2017 0736   NA 142 12/24/2016 0816   K 3.9 09/23/2017 0736   K 4.4 12/24/2016 0816   CL 104 09/23/2017 0736   CL 106 06/30/2012 1004   CO2 28 09/23/2017 0736   CO2 28 12/24/2016 0816   BUN  15 09/23/2017 0736   BUN 15.8 12/24/2016 0816   CREATININE 0.90 09/23/2017 0736   CREATININE 0.87 07/22/2017 0826   CREATININE 1.1 12/24/2016 0816      Component Value Date/Time   CALCIUM 9.5 09/23/2017 0736   CALCIUM 9.6 12/24/2016 0816   ALKPHOS 74 09/23/2017 0736   ALKPHOS 80 12/24/2016 0816   AST 18 09/23/2017 0736   AST 20 07/22/2017 0826   AST 20 12/24/2016 0816   ALT 9 09/23/2017 0736   ALT 11 07/22/2017 0826   ALT 11 12/24/2016 0816   BILITOT 0.4 09/23/2017 0736   BILITOT 0.4 07/22/2017 0826   BILITOT 0.34 12/24/2016 0816       RADIOGRAPHIC STUDIES: Ct Chest W Contrast  Result Date: 09/18/2017 CLINICAL DATA:  Patient with history of right lung cancer. Follow-up exam. EXAM: CT CHEST, ABDOMEN, AND PELVIS WITH CONTRAST TECHNIQUE: Multidetector CT imaging of the chest, abdomen and pelvis was performed following the standard protocol during bolus administration of intravenous contrast. CONTRAST:  120m OMNIPAQUE IOHEXOL 300 MG/ML  SOLN COMPARISON:  CT CAP  05/19/2017 FINDINGS: CT CHEST FINDINGS Cardiovascular: Right anterior chest wall Port-A-Cath is present with tip terminating in the superior vena cava. Normal heart size. Coronary arterial and thoracic aortic vascular calcifications. Small amount of fluid in the superior pericardial recess. Mediastinum/Nodes: No enlarged axillary, mediastinal or hilar lymphadenopathy. Normal appearance of the esophagus. Lungs/Pleura: Debris within the right lower lobe bronchi. Stable small right pleural effusion and right greater than left basilar scarring. Stable chronic postradiation changes within the medial aspect of the right upper lobe extending into the right lung apex with associated pleuroparenchymal thickening and nodular distortion. No new or enlarging pulmonary nodules. Centrilobular and paraseptal emphysematous change. No pneumothorax. Musculoskeletal: Unchanged T3 compression fracture with vertebra plana appearance. Vertebroplasty material within the T4-T7 vertebral bodies. CT ABDOMEN PELVIS FINDINGS Hepatobiliary: The liver is normal in size and contour. No focal hepatic lesion is identified. Normal gallbladder. No intrahepatic or extrahepatic biliary ductal dilatation. Pancreas: Unremarkable Spleen: Unremarkable Adrenals/Urinary Tract: Normal adrenal glands. Kidneys enhance symmetrically with contrast. Urinary bladder is unremarkable. Stomach/Bowel: Normal morphology of the stomach. No evidence for bowel obstruction. Mild wall thickening of the sigmoid colon and rectum. Stool throughout the colon. Vascular/Lymphatic: Normal caliber abdominal aorta. Peripheral calcified atherosclerotic plaque. No retroperitoneal lymphadenopathy. Reproductive: Uterus and adnexal structures unremarkable. Other: None. Musculoskeletal: No aggressive or acute appearing osseous lesions. Lumbar spine degenerative changes. IMPRESSION: 1. Stable post radiation changes right hemithorax. No evidence to suggest localized recurrence. 2. Mild wall  thickening of the sigmoid colon and rectum raising the possibility of colitis. Stool throughout the colon as can be seen with constipation. 3. Aortic Atherosclerosis (ICD10-I70.0) and Emphysema (ICD10-J43.9). Electronically Signed   By: DLovey NewcomerM.D.   On: 09/18/2017 14:24   Ct Abdomen Pelvis W Contrast  Result Date: 09/18/2017 CLINICAL DATA:  Patient with history of right lung cancer. Follow-up exam. EXAM: CT CHEST, ABDOMEN, AND PELVIS WITH CONTRAST TECHNIQUE: Multidetector CT imaging of the chest, abdomen and pelvis was performed following the standard protocol during bolus administration of intravenous contrast. CONTRAST:  1075mOMNIPAQUE IOHEXOL 300 MG/ML  SOLN COMPARISON:  CT CAP 05/19/2017 FINDINGS: CT CHEST FINDINGS Cardiovascular: Right anterior chest wall Port-A-Cath is present with tip terminating in the superior vena cava. Normal heart size. Coronary arterial and thoracic aortic vascular calcifications. Small amount of fluid in the superior pericardial recess. Mediastinum/Nodes: No enlarged axillary, mediastinal or hilar lymphadenopathy. Normal appearance of the esophagus. Lungs/Pleura: Debris within the right  lower lobe bronchi. Stable small right pleural effusion and right greater than left basilar scarring. Stable chronic postradiation changes within the medial aspect of the right upper lobe extending into the right lung apex with associated pleuroparenchymal thickening and nodular distortion. No new or enlarging pulmonary nodules. Centrilobular and paraseptal emphysematous change. No pneumothorax. Musculoskeletal: Unchanged T3 compression fracture with vertebra plana appearance. Vertebroplasty material within the T4-T7 vertebral bodies. CT ABDOMEN PELVIS FINDINGS Hepatobiliary: The liver is normal in size and contour. No focal hepatic lesion is identified. Normal gallbladder. No intrahepatic or extrahepatic biliary ductal dilatation. Pancreas: Unremarkable Spleen: Unremarkable Adrenals/Urinary  Tract: Normal adrenal glands. Kidneys enhance symmetrically with contrast. Urinary bladder is unremarkable. Stomach/Bowel: Normal morphology of the stomach. No evidence for bowel obstruction. Mild wall thickening of the sigmoid colon and rectum. Stool throughout the colon. Vascular/Lymphatic: Normal caliber abdominal aorta. Peripheral calcified atherosclerotic plaque. No retroperitoneal lymphadenopathy. Reproductive: Uterus and adnexal structures unremarkable. Other: None. Musculoskeletal: No aggressive or acute appearing osseous lesions. Lumbar spine degenerative changes. IMPRESSION: 1. Stable post radiation changes right hemithorax. No evidence to suggest localized recurrence. 2. Mild wall thickening of the sigmoid colon and rectum raising the possibility of colitis. Stool throughout the colon as can be seen with constipation. 3. Aortic Atherosclerosis (ICD10-I70.0) and Emphysema (ICD10-J43.9). Electronically Signed   By: Lovey Newcomer M.D.   On: 09/18/2017 14:24    ASSESSMENT AND PLAN: This is a very pleasant 67 years old white female with metastatic non-small cell lung cancer diagnosed in October 2007 status post concurrent chemoradiation followed by consolidation chemotherapy and she is currently on maintenance treatment with single agent Alimta status post 175 cycles. The patient has been tolerating her treatment well with no concerning complaints. I recommended for her to proceed with cycle #176 today as scheduled. For pain management I will give the patient refill of her pain medication but I requested drug screen today to rule out any drug abuse since she has been on these pain medication for several years now. I will see her back for follow-up visit in 3 weeks for evaluation before the next cycle of her treatment. The patient was advised to call immediately if she has any concerning symptoms in the interval. The patient voices understanding of current disease status and treatment options and is in  agreement with the current care plan. All questions were answered. The patient knows to call the clinic with any problems, questions or concerns. We can certainly see the patient much sooner if necessary.  Disclaimer: This note was dictated with voice recognition software. Similar sounding words can inadvertently be transcribed and may not be corrected upon review.

## 2017-10-20 ENCOUNTER — Other Ambulatory Visit: Payer: Self-pay | Admitting: Internal Medicine

## 2017-10-20 DIAGNOSIS — C7931 Secondary malignant neoplasm of brain: Secondary | ICD-10-CM

## 2017-11-04 ENCOUNTER — Inpatient Hospital Stay: Payer: Medicare Other

## 2017-11-04 ENCOUNTER — Telehealth: Payer: Self-pay

## 2017-11-04 ENCOUNTER — Encounter: Payer: Self-pay | Admitting: Oncology

## 2017-11-04 ENCOUNTER — Inpatient Hospital Stay (HOSPITAL_BASED_OUTPATIENT_CLINIC_OR_DEPARTMENT_OTHER): Payer: Medicare Other | Admitting: Oncology

## 2017-11-04 VITALS — BP 133/63 | HR 87 | Temp 98.2°F | Resp 17 | Ht 62.5 in | Wt 114.2 lb

## 2017-11-04 DIAGNOSIS — Z5111 Encounter for antineoplastic chemotherapy: Secondary | ICD-10-CM

## 2017-11-04 DIAGNOSIS — C3411 Malignant neoplasm of upper lobe, right bronchus or lung: Secondary | ICD-10-CM

## 2017-11-04 DIAGNOSIS — Z79899 Other long term (current) drug therapy: Secondary | ICD-10-CM | POA: Diagnosis not present

## 2017-11-04 LAB — CBC WITH DIFFERENTIAL/PLATELET
Abs Immature Granulocytes: 0.03 10*3/uL (ref 0.00–0.07)
BASOS PCT: 0 %
Basophils Absolute: 0 10*3/uL (ref 0.0–0.1)
EOS ABS: 0 10*3/uL (ref 0.0–0.5)
Eosinophils Relative: 0 %
HEMATOCRIT: 34.3 % — AB (ref 36.0–46.0)
Hemoglobin: 11 g/dL — ABNORMAL LOW (ref 12.0–15.0)
IMMATURE GRANULOCYTES: 0 %
LYMPHS ABS: 1.3 10*3/uL (ref 0.7–4.0)
Lymphocytes Relative: 11 %
MCH: 31.1 pg (ref 26.0–34.0)
MCHC: 32.1 g/dL (ref 30.0–36.0)
MCV: 96.9 fL (ref 80.0–100.0)
Monocytes Absolute: 1.1 10*3/uL — ABNORMAL HIGH (ref 0.1–1.0)
Monocytes Relative: 9 %
NRBC: 0 % (ref 0.0–0.2)
Neutro Abs: 9 10*3/uL — ABNORMAL HIGH (ref 1.7–7.7)
Neutrophils Relative %: 80 %
PLATELETS: 495 10*3/uL — AB (ref 150–400)
RBC: 3.54 MIL/uL — ABNORMAL LOW (ref 3.87–5.11)
RDW: 13.3 % (ref 11.5–15.5)
WBC: 11.4 10*3/uL — AB (ref 4.0–10.5)

## 2017-11-04 LAB — COMPREHENSIVE METABOLIC PANEL
ALBUMIN: 3 g/dL — AB (ref 3.5–5.0)
ALT: 14 U/L (ref 0–44)
ANION GAP: 9 (ref 5–15)
AST: 18 U/L (ref 15–41)
Alkaline Phosphatase: 76 U/L (ref 38–126)
BUN: 9 mg/dL (ref 8–23)
CHLORIDE: 105 mmol/L (ref 98–111)
CO2: 27 mmol/L (ref 22–32)
Calcium: 9.4 mg/dL (ref 8.9–10.3)
Creatinine, Ser: 0.81 mg/dL (ref 0.44–1.00)
GFR calc Af Amer: >60 mL/min (ref >60)
GFR calc non Af Amer: >60 mL/min (ref >60)
GLUCOSE: 148 mg/dL — AB (ref 70–99)
POTASSIUM: 4 mmol/L (ref 3.5–5.1)
SODIUM: 141 mmol/L (ref 135–145)
Total Bilirubin: 0.4 mg/dL (ref 0.3–1.2)
Total Protein: 6.8 g/dL (ref 6.5–8.1)

## 2017-11-04 MED ORDER — SODIUM CHLORIDE 0.9 % IV SOLN
500.0000 mg/m2 | Freq: Once | INTRAVENOUS | Status: AC
  Administered 2017-11-04: 800 mg via INTRAVENOUS
  Filled 2017-11-04: qty 20

## 2017-11-04 MED ORDER — DEXAMETHASONE SODIUM PHOSPHATE 10 MG/ML IJ SOLN
10.0000 mg | Freq: Once | INTRAMUSCULAR | Status: AC
  Administered 2017-11-04: 10 mg via INTRAVENOUS

## 2017-11-04 MED ORDER — HEPARIN SOD (PORK) LOCK FLUSH 100 UNIT/ML IV SOLN
500.0000 [IU] | Freq: Once | INTRAVENOUS | Status: AC | PRN
  Administered 2017-11-04: 500 [IU]
  Filled 2017-11-04: qty 5

## 2017-11-04 MED ORDER — ONDANSETRON HCL 8 MG PO TABS
8.0000 mg | ORAL_TABLET | Freq: Once | ORAL | Status: AC
  Administered 2017-11-04: 8 mg via ORAL

## 2017-11-04 MED ORDER — SODIUM CHLORIDE 0.9 % IJ SOLN
10.0000 mL | INTRAMUSCULAR | Status: DC | PRN
  Administered 2017-11-04: 10 mL
  Filled 2017-11-04: qty 10

## 2017-11-04 MED ORDER — ONDANSETRON HCL 4 MG/2ML IJ SOLN
INTRAMUSCULAR | Status: AC
  Filled 2017-11-04: qty 4

## 2017-11-04 MED ORDER — CYANOCOBALAMIN 1000 MCG/ML IJ SOLN
INTRAMUSCULAR | Status: AC
  Filled 2017-11-04: qty 1

## 2017-11-04 MED ORDER — ONDANSETRON HCL 8 MG PO TABS
ORAL_TABLET | ORAL | Status: AC
  Filled 2017-11-04: qty 1

## 2017-11-04 MED ORDER — SODIUM CHLORIDE 0.9 % IJ SOLN
10.0000 mL | Freq: Once | INTRAMUSCULAR | Status: AC
  Administered 2017-11-04: 10 mL
  Filled 2017-11-04: qty 10

## 2017-11-04 MED ORDER — DEXAMETHASONE SODIUM PHOSPHATE 10 MG/ML IJ SOLN
INTRAMUSCULAR | Status: AC
  Filled 2017-11-04: qty 1

## 2017-11-04 MED ORDER — SODIUM CHLORIDE 0.9 % IV SOLN
Freq: Once | INTRAVENOUS | Status: AC
  Administered 2017-11-04: 11:00:00 via INTRAVENOUS
  Filled 2017-11-04: qty 250

## 2017-11-04 MED ORDER — CYANOCOBALAMIN 1000 MCG/ML IJ SOLN
1000.0000 ug | Freq: Once | INTRAMUSCULAR | Status: AC
  Administered 2017-11-04: 1000 ug via INTRAMUSCULAR

## 2017-11-04 NOTE — Telephone Encounter (Signed)
Printed avs and calender of upcoming. Per 10/29 los

## 2017-11-04 NOTE — Progress Notes (Signed)
[facility] OFFICE PROGRESS NOTE  [name], MD [place] [address]  DIAGNOSIS:Metastatic non-small cell lung cancer initially diagnosed as locally advanced stage IIIB with a right Pancoast tumor involving the vertebral body as well as prominent canal invasion with spinal cord compression in October 2007. The patient also has metastatic disease to the brain in April 2008.  PRIOR THERAPY: 1. Status post concurrent chemoradiation with weekly carboplatin and paclitaxel, last dose was given November 18, 2005. 2. 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 3. Status post gamma knife radiotherapy to a solitary brain lesion located in the superior frontal area of the brain at [facility] in April of 2008. 4. Status post palliative radiotherapy to the lateral abdominal wall metastatic lesion under the care of Dr. Lisbeth Renshaw, completed March of 2009. 5. Status post 6 cycles of systemic chemotherapy with carboplatin and Alimta. Last dose was given July 26, 2007 with disease stabilization. 6. Gamma knife stereotactic radiotherapy to 2 brain lesions one involving the right frontal dural based as well as right parietal lesion performed on 05/07/2012 under the care of Dr. Vallarie Mare at [facility].  CURRENT THERAPY: Maintenance systemic chemotherapy with Alimta 500 MG/M2 every 3 weeks, status post 176cycles.  INTERVAL HISTORY: Danielle [name] 67 y.o. female returns for routine follow-up visit accompanied by her daughter.  The patient is feeling fine today and has no specific complaints.  She denies fevers and chills.  Denies chest pain, shortness breath, hemoptysis.  She has her baseline cough.  Denies nausea, vomiting, constipation, diarrhea.  Denies recent weight loss or night sweats.  The patient is here for evaluation prior to cycle #177 of Alimta.  MEDICAL HISTORY: Past Medical  History:  Diagnosis Date  . Anemia   . 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 CVA (cerebral vascular accident) ([place]) 04/2014   pt states had 2 cva's within 2 wks  . DJD (degenerative joint disease), cervical   . Fibromyalgia   . H/O: pneumonia   . History of tobacco abuse quit 10/08   on nicotine patch  . Hypertension   . Hypokalemia   . Lung cancer ([place]) dx'd 09/2005   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 had chemo and radiation for lung ca  . Thrush 12/11/2010    ALLERGIES:  is allergic to contrast media [iodinated diagnostic agents]; iohexol; sulfa drugs cross reactors; and co-trimoxazole injection [sulfamethoxazole-trimethoprim].  MEDICATIONS:  Current Outpatient Medications  Medication Sig Dispense Refill  . amLODipine (NORVASC) 10 MG tablet TAKE 1 TABLET (10 MG TOTAL) BY MOUTH DAILY. 90 tablet 1  . aspirin EC 325 MG tablet TAKE 1 TABLET BY MOUTH EVERY DAY 90 tablet 0  . atorvastatin (LIPITOR) 40 MG tablet TAKE 1 TABLET BY MOUTH EVERY DAY AT 6 PM 90 tablet 3  . diphenhydrAMINE (BENADRYL) 25 MG tablet Take 1 tablet (25 mg total) by mouth as directed. 2 hours before CT scan. 5 tablet 0  . FeFum-FePoly-FA-B Cmp-C-Biot (INTEGRA PLUS) CAPS Take 1 capsule by mouth every morning. 90 capsule 2  . fluticasone (FLONASE) 50 MCG/ACT nasal spray Place 1-2 sprays into both nostrils daily as needed. 16 g 5  . folic acid (FOLVITE) 1 MG tablet TAKE 1 TABLET BY MOUTH EVERY DAY 90 tablet 1  . furosemide (LASIX) 20 MG tablet TAKE 1 TABLET (20 MG TOTAL) BY MOUTH DAILY. AS NEEDED FOR SWELLING. 20 tablet 0  . lacosamide (  VIMPAT) 50 MG TABS tablet Take by mouth.    . lidocaine-prilocaine (EMLA) cream Apply to Port-A-Cath as directed 1-2 hours prior to chemotherapy 30 g 1  . lisinopril (PRINIVIL,ZESTRIL) 20 MG tablet TAKE 2 TABLETS (40 MG TOTAL) BY MOUTH DAILY. 180 tablet 1  . morphine (MS CONTIN) 60 MG 12 hr tablet Take 1 tablet (60 mg total) by mouth 2 (two) times daily. 60 tablet  0  . morphine (MSIR) 30 MG tablet Take 1 tablet (30 mg total) by mouth every 4 (four) hours as needed (breakthrough pain). 60 tablet 0  . Olopatadine HCl 0.2 % SOLN Apply 1 drop to eye daily. 2.5 mL 1  . ondansetron (ZOFRAN) 8 MG tablet TAKE 1 TABLET BY MOUTH EVERY 8 HOURS AS NEEDED FOR FOR NAUSEA OR VOMITING 30 tablet 0  . triamcinolone (KENALOG) 0.025 % ointment Apply 1 application topically 2 (two) times daily. 30 g 0  . predniSONE (DELTASONE) 50 MG tablet 1 tablet by mouth 13 hours ,7 hours and 1 hour  before CT scan (Patient not taking: Reported on 11/04/2017) 3 tablet 3   No current facility-administered medications for this visit.    Facility-Administered Medications Ordered in Other Visits  Medication Dose Route Frequency Provider Last Rate Last Dose  . 0.9 %  sodium chloride infusion   Intravenous Once Curt Bears, MD      . cyanocobalamin ((VITAMIN B-12)) injection 1,000 mcg  1,000 mcg Intramuscular Once Curt Bears, MD      . dexamethasone (DECADRON) injection 10 mg  10 mg Intravenous Once Curt Bears, MD      . heparin lock flush 100 unit/mL  500 Units Intracatheter Once PRN Curt Bears, MD      . ondansetron [facility]) tablet 8 mg  8 mg Oral Once Curt Bears, MD      . PEMEtrexed (ALIMTA) 800 mg in sodium chloride 0.9 % 100 mL chemo infusion  500 mg/m2 (Order-Specific) Intravenous Once Curt Bears, MD      . sodium chloride 0.9 % injection 10 mL  10 mL Intracatheter PRN Curt Bears, MD        SURGICAL HISTORY:  Past Surgical History:  Procedure Laterality Date  . CERVICAL LAMINECTOMY  1995  . KNEE SURGERY  1990   Left x 2  . 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 LOOP RECORDER IMPLANT N/A 04/19/2014   Procedure: LOOP RECORDER IMPLANT;  Surgeon: Thompson Grayer, MD;  Location: [facility] CATH LAB;  Service: Cardiovascular;  Laterality: N/A;  . OTHER SURGICAL HISTORY  2008   Gamma knife surgery to remove brain met   .  PORTACATH PLACEMENT  -[name]   TIP IN CAVOATRIAL JUNCTION  . TEE WITHOUT CARDIOVERSION N/A 04/19/2014   Procedure: TRANSESOPHAGEAL ECHOCARDIOGRAM (TEE);  Surgeon: Larey Dresser, MD;  Location: [place] [facility];  Service: Cardiovascular;  Laterality: N/A;    REVIEW OF SYSTEMS:   Review of Systems  Constitutional: Negative for appetite change, chills, fatigue, fever and unexpected weight change.  HENT:   Negative for mouth sores, nosebleeds, sore throat and trouble swallowing.   Eyes: Negative for eye problems and icterus.  Respiratory: Negative for hemoptysis, shortness of breath and wheezing.  Positive for cough. Cardiovascular: Negative for chest pain and leg swelling.  Gastrointestinal: Negative for abdominal pain, constipation, diarrhea, nausea and vomiting.  Genitourinary: Negative for bladder incontinence, difficulty urinating, dysuria, frequency and hematuria.   Musculoskeletal: Negative for back pain, gait problem, neck pain  and neck stiffness.  Skin: Negative for itching and rash.  Neurological: Negative for dizziness, extremity weakness, gait problem, headaches, light-headedness and seizures.  Hematological: Negative for adenopathy. Does not bruise/bleed easily.  Psychiatric/Behavioral: Negative for confusion, depression and sleep disturbance. The patient is not nervous/anxious.     PHYSICAL EXAMINATION:  Blood pressure 133/63, pulse 87, temperature 98.2 F (36.8 C), temperature source Oral, resp. rate 17, height 5' 2.5" (1.588 m), weight 114 lb 3.2 oz (51.8 kg), SpO2 99 %.  ECOG PERFORMANCE STATUS: 1 - Symptomatic but completely ambulatory  Physical Exam  Constitutional: Oriented to person, place, and time and well-developed, well-nourished, and in no distress. No distress.  HENT:  Head: Normocephalic and atraumatic.  Mouth/Throat: Oropharynx is clear and moist. No oropharyngeal exudate.  Eyes: Conjunctivae are normal. Right eye exhibits no discharge. Left eye exhibits no  discharge. No scleral icterus.  Neck: Normal range of motion. Neck supple.  Cardiovascular: Normal rate, regular rhythm, normal heart sounds and intact distal pulses.   Pulmonary/Chest: Effort normal and breath sounds normal. No respiratory distress. No wheezes. No rales.  Abdominal: Soft. Bowel sounds are normal. Exhibits no distension and no mass. There is no tenderness.  Musculoskeletal: Normal range of motion. Exhibits no edema.  Lymphadenopathy:    No cervical adenopathy.  Neurological: Alert and oriented to person, place, and time. Exhibits normal muscle tone. Gait normal. Coordination normal.  Skin: Skin is warm and dry. No rash noted. Not diaphoretic. No erythema. No pallor.  Psychiatric: Mood, memory and judgment normal.  Vitals reviewed.  LABORATORY DATA: Lab Results  Component Value Date   WBC 11.4 (H) 11/04/2017   HGB 11.0 (L) 11/04/2017   HCT 34.3 (L) 11/04/2017   MCV 96.9 11/04/2017   PLT 495 (H) 11/04/2017      Chemistry      Component Value Date/Time   NA 141 11/04/2017 0830   NA 142 12/24/2016 0816   K 4.0 11/04/2017 0830   K 4.4 12/24/2016 0816   CL 105 11/04/2017 0830   CL 106 06/30/2012 1004   CO2 27 11/04/2017 0830   CO2 28 12/24/2016 0816   BUN 9 11/04/2017 0830   BUN 15.8 12/24/2016 0816   CREATININE 0.81 11/04/2017 0830   CREATININE 0.87 07/22/2017 0826   CREATININE 1.1 12/24/2016 0816      Component Value Date/Time   CALCIUM 9.4 11/04/2017 0830   CALCIUM 9.6 12/24/2016 0816   ALKPHOS 76 11/04/2017 0830   ALKPHOS 80 12/24/2016 0816   AST 18 11/04/2017 0830   AST 20 07/22/2017 0826   AST 20 12/24/2016 0816   ALT 14 11/04/2017 0830   ALT 11 07/22/2017 0826   ALT 11 12/24/2016 0816   BILITOT 0.4 11/04/2017 0830   BILITOT 0.4 07/22/2017 0826   BILITOT 0.34 12/24/2016 0816       RADIOGRAPHIC STUDIES:  No results found.   ASSESSMENT/PLAN:  Cancer of upper lobe of right lung This is a very pleasant 67 year old white female with  metastatic non-small cell lung cancer diagnosed in October 2007 status post concurrent chemoradiation followed by consolidation chemotherapy and she is currently on maintenance treatment with single agent Alimta status post 176 cycles. The patient has been tolerating her treatment well with no concerning complaints. I recommended for her to proceed with cycle #177 today as scheduled.  For pain management, she will continue her current pain medications.  She will follow-up visit in 3 weeks for evaluation before the next cycle of her treatment.  The patient was advised to call immediately if she has any concerning symptoms in the interval. The patient voices understanding of current disease status and treatment options and is in agreement with the current care plan. All questions were answered. The patient knows to call the clinic with any problems, questions or concerns. We can certainly see the patient much sooner if necessary.   No orders of the defined types were placed in this encounter.    Mikey Bussing, DNP, AGPCNP-BC, AOCNP 11/04/17

## 2017-11-04 NOTE — Assessment & Plan Note (Addendum)
This is a very pleasant 67 year old white female with metastatic non-small cell lung cancer diagnosed in October 2007 status post concurrent chemoradiation followed by consolidation chemotherapy and she is currently on maintenance treatment with single agent Alimta status post 176 cycles. The patient has been tolerating her treatment well with no concerning complaints. I recommended for her to proceed with cycle #177 today as scheduled.  For pain management, she will continue her current pain medications.  She will follow-up visit in 3 weeks for evaluation before the next cycle of her treatment.  The patient was advised to call immediately if she has any concerning symptoms in the interval. The patient voices understanding of current disease status and treatment options and is in agreement with the current care plan. All questions were answered. The patient knows to call the clinic with any problems, questions or concerns. We can certainly see the patient much sooner if necessary.

## 2017-11-04 NOTE — Patient Instructions (Signed)
[facility] Discharge Instructions for Patients Receiving Chemotherapy  Today you received the following chemotherapy agents:  Alimta  To help prevent nausea and vomiting after your treatment, we encourage you to take your nausea medication as prescribed.   If you develop nausea and vomiting that is not controlled by your nausea medication, call the clinic.   BELOW ARE SYMPTOMS THAT SHOULD BE REPORTED IMMEDIATELY:  *FEVER GREATER THAN 100.5 F  *CHILLS WITH OR WITHOUT FEVER  NAUSEA AND VOMITING THAT IS NOT CONTROLLED WITH YOUR NAUSEA MEDICATION  *UNUSUAL SHORTNESS OF BREATH  *UNUSUAL BRUISING OR BLEEDING  TENDERNESS IN MOUTH AND THROAT WITH OR WITHOUT PRESENCE OF ULCERS  *URINARY PROBLEMS  *BOWEL PROBLEMS  UNUSUAL RASH Items with * indicate a potential emergency and should be followed up as soon as possible.  Feel free to call the clinic should you have any questions or concerns. The clinic phone number is [phone].  Please show the [place] at check-in to the Emergency Department and triage nurse.

## 2017-11-05 ENCOUNTER — Other Ambulatory Visit: Payer: Self-pay | Admitting: Internal Medicine

## 2017-11-07 ENCOUNTER — Ambulatory Visit (INDEPENDENT_AMBULATORY_CARE_PROVIDER_SITE_OTHER): Payer: Medicare Other | Admitting: Family Medicine

## 2017-11-07 ENCOUNTER — Encounter: Payer: Self-pay | Admitting: Family Medicine

## 2017-11-07 VITALS — BP 118/62 | HR 83 | Temp 97.6°F | Ht 62.5 in | Wt 113.0 lb

## 2017-11-07 DIAGNOSIS — I700 Atherosclerosis of aorta: Secondary | ICD-10-CM

## 2017-11-07 DIAGNOSIS — I10 Essential (primary) hypertension: Secondary | ICD-10-CM

## 2017-11-07 DIAGNOSIS — E785 Hyperlipidemia, unspecified: Secondary | ICD-10-CM

## 2017-11-07 DIAGNOSIS — C7931 Secondary malignant neoplasm of brain: Secondary | ICD-10-CM | POA: Diagnosis not present

## 2017-11-07 DIAGNOSIS — E119 Type 2 diabetes mellitus without complications: Secondary | ICD-10-CM | POA: Diagnosis not present

## 2017-11-07 DIAGNOSIS — C3490 Malignant neoplasm of unspecified part of unspecified bronchus or lung: Secondary | ICD-10-CM

## 2017-11-07 DIAGNOSIS — Z0000 Encounter for general adult medical examination without abnormal findings: Secondary | ICD-10-CM

## 2017-11-07 DIAGNOSIS — L989 Disorder of the skin and subcutaneous tissue, unspecified: Secondary | ICD-10-CM | POA: Diagnosis not present

## 2017-11-07 MED ORDER — DOXYCYCLINE HYCLATE 100 MG PO TABS: 100.0000 mg | ORAL_TABLET | Freq: Two times a day (BID) | ORAL | 0 refills | Status: DC

## 2017-11-07 NOTE — Assessment & Plan Note (Addendum)
Pt refuses to lift off bandaid more than a small amount. She states she is uncomfortable showing it and has no bad=ndaids to replace the one. When told we have bandaids, she still does not want to remove them more tha a corner for me to peek.  Wound culture obtained. Appears concerning for basal cell carcinoma given rolled borders White discharge on lesion.. Start antibiotics..  She will follow up in 1 week and states she will let me remove bandage then.  likely will need derm referral.  Second lesion on nose that she states she picked out... Destructive open sore 1 cm x 1.5 cm irregular.. Concerning for carcinoma as well.

## 2017-11-07 NOTE — Assessment & Plan Note (Signed)
Stable per onc.

## 2017-11-07 NOTE — Patient Instructions (Addendum)
Start antibiotics.  If skin lesion worsening or fever. Call.

## 2017-11-07 NOTE — Addendum Note (Signed)
Addended by: [name] E on: 11/07/2017 04:33 PM   Modules accepted: Orders

## 2017-11-07 NOTE — Progress Notes (Signed)
Subjective:    Patient ID: Danielle [name], female    DOB: [birth year], 67 y.o.   MRN: [id number]  HPI The patient presents for complete physical and review of chronic health problems. He/She also has the following acute concerns today: skin lesion  The patient saw Candis Musa, LPN for medicare wellness. Note reviewed in detail and important notes copied below. Health maintenance:  Eye exam - addressed PNA vaccine - addressed A1C - completed  Abnormal screenings:   None   11/07/17  Metastatic lung cancer, mets to brain and bone. Metastatic non-small cell lung cancer initially diagnosed as locally advanced stage IIIB with a right Pancoast tumor involving the vertebral body as well as prominent canal invasion with spinal cord compression in October 2007. The patient also has metastatic disease to the brain in April 2008.   Currently undergoing chemotherapy. Treatment 177  Followed by Dr. Mora Appl. She has noted pustule on right side of face after scratching at scab on skin lesion.Marland Kitchen gradually increase in size in last several months. Nowl white discharge. No fever.  Using antibiotic ointment.  Silver dollar size.   Also has sore on right nostril  In last few days.. picked on it and now open sore.  Diabetes:   Good control on  No medicaiton. Lab Results  Component Value Date   HGBA1C 5.5 08/05/2017  Using medications without difficulties: Hypoglycemic episodes: Hyperglycemic episodes: Feet problems: no ulcer Blood Sugars averaging: not checking eye exam within last year:  Elevated Cholesterol:  At goal on lipitor 40 mg daily. Lab Results  Component Value Date   CHOL 112 08/05/2017   HDL 38.40 (L) 08/05/2017   LDLCALC 54 08/05/2017   LDLDIRECT 105.5 08/06/2013   TRIG 97.0 08/05/2017   CHOLHDL 3 08/05/2017  Using medications without problems: Muscle aches:  Diet compliance: good Exercise: does as much  as she can Other complaints:  Hypertension:  Good  control on lisinopril, amlodipine  Using medication without problems or lightheadedness:  occ with lasix Chest pain with exertion:none Edema: Bilateral Short of breath: stable Average home BPs: Other issues:   Social History /Family History/Past Medical History reviewed in detail and updated in EMR if needed. Blood pressure 118/62, pulse 83, temperature 97.6 F (36.4 C), temperature source Oral, height 5' 2.5" (1.588 m), weight 113 lb (51.3 kg), SpO2 96 %.   Review of Systems  Constitutional: Positive for fatigue. Negative for fever.  HENT: Negative for congestion.   Eyes: Negative for pain.  Respiratory: Positive for shortness of breath. Negative for cough.   Cardiovascular: Positive for leg swelling. Negative for chest pain and palpitations.  Gastrointestinal: Negative for abdominal pain.  Genitourinary: Negative for dysuria and vaginal bleeding.  Musculoskeletal: Negative for back pain.  Neurological: Negative for syncope, light-headedness and headaches.  Psychiatric/Behavioral: Negative for dysphoric mood.       Objective:   Physical Exam  Constitutional: Vital signs are normal. She appears well-developed and well-nourished. She is cooperative.  Non-toxic appearance. She does not appear ill. No distress.  HENT:  Head: Normocephalic.  Right Ear: Hearing, tympanic membrane, external ear and ear canal normal.  Left Ear: Hearing, tympanic membrane, external ear and ear canal normal.  Nose: Nose normal.  Eyes: Pupils are equal, round, and reactive to light. Conjunctivae, EOM and lids are normal. Lids are everted and swept, no foreign bodies found.  Neck: Trachea normal and normal range of motion. Neck supple. Carotid bruit is not present. No thyroid mass and no thyromegaly present.  Cardiovascular: Normal rate, regular rhythm, S1 normal, S2 normal, normal heart sounds and intact distal pulses. Exam reveals no gallop.  No murmur heard. Bilateral lower ext edema.. Left > right    Pulmonary/Chest: Effort normal and breath sounds normal. No respiratory distress. She has no wheezes. She has no rhonchi. She has no rales.  Abdominal: Soft. Normal appearance and bowel sounds are normal. She exhibits no distension, no fluid wave, no abdominal bruit and no mass. There is no hepatosplenomegaly. There is no tenderness. There is no rebound, no guarding and no CVA tenderness. No hernia.  Lymphadenopathy:    She has no cervical adenopathy.    She has no axillary adenopathy.  Neurological: She is alert. She has normal strength. No cranial nerve deficit or sensory deficit.  Skin: Skin is warm, dry and intact. No rash noted.  Ulcer on right  right nose 1x1.5 cm  right temple small portion of quarter size lesion see rolled border, white discharge, rolled borders  Psychiatric: Her speech is normal and behavior is normal. Judgment normal. Her mood appears not anxious. Cognition and memory are normal. She does not exhibit a depressed mood.     Diabetic foot exam: Normal inspection No skin breakdown No calluses  Normal DP pulses Normal sensation to light touch and monofilament Nails normal       Assessment & Plan:  The patient's preventative maintenance and recommended screening tests for an annual wellness exam were reviewed in full today. Brought up to date unless services declined.  Counselled on the importance of diet, exercise, and its role in overall health and mortality. The patient's FH and SH was reviewed, including their home life, tobacco status, and drug and alcohol status.   Hep C screening - completed HIV screening - completed A1C - completed Mammogram/ breast exam- declined PAP smear - declined Shingles - contraindicated Eye exam - pt reported exam in June 2018 Colonoscopy: declined

## 2017-11-07 NOTE — Assessment & Plan Note (Signed)
Risk factor modification.

## 2017-11-07 NOTE — Assessment & Plan Note (Signed)
Diet controlled.  

## 2017-11-07 NOTE — Assessment & Plan Note (Signed)
Well controlled. Continue current medication.  

## 2017-11-07 NOTE — Assessment & Plan Note (Signed)
On active chemo.

## 2017-11-11 ENCOUNTER — Telehealth: Payer: Self-pay | Admitting: *Deleted

## 2017-11-11 ENCOUNTER — Other Ambulatory Visit: Payer: Self-pay | Admitting: Family Medicine

## 2017-11-11 LAB — WOUND CULTURE
MICRO NUMBER: 91317596
SPECIMEN QUALITY: ADEQUATE

## 2017-11-11 MED ORDER — LEVOFLOXACIN 500 MG PO TABS: 500.0000 mg | ORAL_TABLET | Freq: Every day | ORAL | 0 refills | Status: DC

## 2017-11-11 NOTE — Telephone Encounter (Signed)
Left message for Danielle [name] to return call in regards to wound culture results.  See result note.

## 2017-11-13 ENCOUNTER — Other Ambulatory Visit: Payer: Self-pay | Admitting: Medical Oncology

## 2017-11-13 DIAGNOSIS — C3490 Malignant neoplasm of unspecified part of unspecified bronchus or lung: Secondary | ICD-10-CM

## 2017-11-13 DIAGNOSIS — C3410 Malignant neoplasm of upper lobe, unspecified bronchus or lung: Secondary | ICD-10-CM

## 2017-11-13 MED ORDER — MORPHINE SULFATE ER 60 MG PO TBCR: 60.0000 mg | EXTENDED_RELEASE_TABLET | Freq: Two times a day (BID) | ORAL | 0 refills | Status: DC

## 2017-11-13 MED ORDER — MORPHINE SULFATE 30 MG PO TABS: 30.0000 mg | ORAL_TABLET | ORAL | 0 refills | Status: DC | PRN

## 2017-11-14 ENCOUNTER — Encounter: Payer: Self-pay | Admitting: Family Medicine

## 2017-11-14 ENCOUNTER — Ambulatory Visit (INDEPENDENT_AMBULATORY_CARE_PROVIDER_SITE_OTHER): Payer: Medicare Other | Admitting: Family Medicine

## 2017-11-14 VITALS — BP 100/60 | HR 91 | Temp 98.3°F | Ht 62.5 in | Wt 112.5 lb

## 2017-11-14 DIAGNOSIS — D4989 Neoplasm of unspecified behavior of other specified sites: Secondary | ICD-10-CM | POA: Diagnosis not present

## 2017-11-14 MED ORDER — LEVOFLOXACIN 500 MG PO TABS: 500.0000 mg | ORAL_TABLET | Freq: Every day | ORAL | 0 refills | Status: DC

## 2017-11-14 NOTE — Progress Notes (Signed)
   Subjective:    Patient ID: Danielle [name], female    DOB: 05/24/1950, 67 y.o.   MRN: [id number]  HPI  67 year old female with metastatic lung ca presents for follow up skin lesion.   Wound culture of partially viewed lesion showed pseudomonas and staph.  Change to levaquin.  Review of Systems     Objective:   Physical Exam  Constitutional: Vital signs are normal. She appears well-developed and well-nourished. She is cooperative.  Non-toxic appearance. She does not appear ill. No distress.  HENT:  Head: Normocephalic.  Right Ear: Hearing, tympanic membrane, external ear and ear canal normal.  Left Ear: Hearing, tympanic membrane, external ear and ear canal normal.  Nose: Nose normal.  Eyes: Pupils are equal, round, and reactive to light. Conjunctivae, EOM and lids are normal. Lids are everted and swept, no foreign bodies found.  Neck: Trachea normal and normal range of motion. Neck supple. Carotid bruit is not present. No thyroid mass and no thyromegaly present.  Cardiovascular: Normal rate, regular rhythm, S1 normal, S2 normal, normal heart sounds and intact distal pulses. Exam reveals no gallop.  No murmur heard. Bilateral lower ext edema.. Left > right  Pulmonary/Chest: Effort normal and breath sounds normal. No respiratory distress. She has no wheezes. She has no rhonchi. She has no rales.  Abdominal: Soft. Normal appearance and bowel sounds are normal. She exhibits no distension, no fluid wave, no abdominal bruit and no mass. There is no hepatosplenomegaly. There is no tenderness. There is no rebound, no guarding and no CVA tenderness. No hernia.  Lymphadenopathy:    She has no cervical adenopathy.    She has no axillary adenopathy.  Neurological: She is alert. She has normal strength. No cranial nerve deficit or sensory deficit.  Skin: Skin is warm, dry and intact. No rash noted.  Ulcer on right  right nose 1x1.5 cm  right temple 4 cm x 4.5 cm rolled border, white  discharge, erythematous edge  Psychiatric: Her speech is normal and behavior is normal. Judgment normal. Her mood appears not anxious. Cognition and memory are normal. She does not exhibit a depressed mood.          Assessment & Plan:

## 2017-11-14 NOTE — Assessment & Plan Note (Addendum)
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 Superinfected with pseudomonas and staph... Extend Levaquin for 14 days.  Pt is on chemo for metastatic lung cancer  for many years, beyond expectation.  This lesion is painful and likely to get bigger... Refer to derm for possible removal/etc.  Area on nose also likely with underlying skin cancer... Has been present for > 1 year.

## 2017-11-14 NOTE — Patient Instructions (Addendum)
Please stop at the front desk to set up referral.

## 2017-11-19 ENCOUNTER — Other Ambulatory Visit: Payer: Self-pay | Admitting: Family Medicine

## 2017-11-20 DIAGNOSIS — C44311 Basal cell carcinoma of skin of nose: Secondary | ICD-10-CM | POA: Diagnosis not present

## 2017-11-20 DIAGNOSIS — C44329 Squamous cell carcinoma of skin of other parts of face: Secondary | ICD-10-CM | POA: Diagnosis not present

## 2017-11-22 ENCOUNTER — Other Ambulatory Visit: Payer: Self-pay | Admitting: Internal Medicine

## 2017-11-22 DIAGNOSIS — C7931 Secondary malignant neoplasm of brain: Secondary | ICD-10-CM

## 2017-11-25 ENCOUNTER — Other Ambulatory Visit: Payer: Self-pay

## 2017-11-25 ENCOUNTER — Inpatient Hospital Stay: Payer: Medicare Other | Attending: Internal Medicine

## 2017-11-25 ENCOUNTER — Inpatient Hospital Stay: Payer: Medicare Other

## 2017-11-25 ENCOUNTER — Telehealth: Payer: Self-pay | Admitting: Internal Medicine

## 2017-11-25 ENCOUNTER — Inpatient Hospital Stay (HOSPITAL_BASED_OUTPATIENT_CLINIC_OR_DEPARTMENT_OTHER): Payer: Medicare Other | Admitting: Internal Medicine

## 2017-11-25 ENCOUNTER — Encounter: Payer: Self-pay | Admitting: Internal Medicine

## 2017-11-25 VITALS — BP 108/56 | HR 77 | Temp 98.1°F | Resp 18 | Ht 62.5 in | Wt 114.4 lb

## 2017-11-25 DIAGNOSIS — C7931 Secondary malignant neoplasm of brain: Secondary | ICD-10-CM

## 2017-11-25 DIAGNOSIS — C3411 Malignant neoplasm of upper lobe, right bronchus or lung: Secondary | ICD-10-CM

## 2017-11-25 DIAGNOSIS — Z5111 Encounter for antineoplastic chemotherapy: Secondary | ICD-10-CM | POA: Insufficient documentation

## 2017-11-25 DIAGNOSIS — I10 Essential (primary) hypertension: Secondary | ICD-10-CM

## 2017-11-25 DIAGNOSIS — C3490 Malignant neoplasm of unspecified part of unspecified bronchus or lung: Secondary | ICD-10-CM

## 2017-11-25 DIAGNOSIS — Z95828 Presence of other vascular implants and grafts: Secondary | ICD-10-CM

## 2017-11-25 LAB — CBC WITH DIFFERENTIAL/PLATELET
Abs Immature Granulocytes: 0.03 10*3/uL (ref 0.00–0.07)
BASOS ABS: 0 10*3/uL (ref 0.0–0.1)
Basophils Relative: 0 %
EOS ABS: 0 10*3/uL (ref 0.0–0.5)
Eosinophils Relative: 0 %
HEMATOCRIT: 33.8 % — AB (ref 36.0–46.0)
Hemoglobin: 10.7 g/dL — ABNORMAL LOW (ref 12.0–15.0)
IMMATURE GRANULOCYTES: 0 %
LYMPHS PCT: 15 %
Lymphs Abs: 1.4 10*3/uL (ref 0.7–4.0)
MCH: 31.3 pg (ref 26.0–34.0)
MCHC: 31.7 g/dL (ref 30.0–36.0)
MCV: 98.8 fL (ref 80.0–100.0)
Monocytes Absolute: 1.1 10*3/uL — ABNORMAL HIGH (ref 0.1–1.0)
Monocytes Relative: 11 %
NEUTROS PCT: 74 %
NRBC: 0 % (ref 0.0–0.2)
Neutro Abs: 7.1 10*3/uL (ref 1.7–7.7)
Platelets: 300 10*3/uL (ref 150–400)
RBC: 3.42 MIL/uL — AB (ref 3.87–5.11)
RDW: 14.4 % (ref 11.5–15.5)
WBC: 9.7 10*3/uL (ref 4.0–10.5)

## 2017-11-25 LAB — COMPREHENSIVE METABOLIC PANEL
ALBUMIN: 3.1 g/dL — AB (ref 3.5–5.0)
ALK PHOS: 67 U/L (ref 38–126)
ALT: 8 U/L (ref 0–44)
ANION GAP: 9 (ref 5–15)
AST: 19 U/L (ref 15–41)
BUN: 19 mg/dL (ref 8–23)
CALCIUM: 9.3 mg/dL (ref 8.9–10.3)
CO2: 30 mmol/L (ref 22–32)
CREATININE: 1.25 mg/dL — AB (ref 0.44–1.00)
Chloride: 102 mmol/L (ref 98–111)
GFR calc non Af Amer: 44 mL/min — ABNORMAL LOW (ref >60)
GFR, EST AFRICAN AMERICAN: 51 mL/min — AB (ref >60)
GLUCOSE: 111 mg/dL — AB (ref 70–99)
Potassium: 3.9 mmol/L (ref 3.5–5.1)
Sodium: 141 mmol/L (ref 135–145)
Total Bilirubin: 0.5 mg/dL (ref 0.3–1.2)
Total Protein: 6.5 g/dL (ref 6.5–8.1)

## 2017-11-25 MED ORDER — HEPARIN SOD (PORK) LOCK FLUSH 100 UNIT/ML IV SOLN
500.0000 [IU] | Freq: Once | INTRAVENOUS | Status: AC | PRN
  Administered 2017-11-25: 500 [IU]
  Filled 2017-11-25: qty 5

## 2017-11-25 MED ORDER — SODIUM CHLORIDE 0.9 % IJ SOLN
10.0000 mL | INTRAMUSCULAR | Status: DC | PRN
  Administered 2017-11-25: 10 mL
  Filled 2017-11-25: qty 10

## 2017-11-25 MED ORDER — ONDANSETRON HCL 8 MG PO TABS
8.0000 mg | ORAL_TABLET | Freq: Once | ORAL | Status: AC
  Administered 2017-11-25: 8 mg via ORAL

## 2017-11-25 MED ORDER — SODIUM CHLORIDE 0.9 % IV SOLN
Freq: Once | INTRAVENOUS | Status: AC
  Administered 2017-11-25: 10:00:00 via INTRAVENOUS
  Filled 2017-11-25: qty 250

## 2017-11-25 MED ORDER — SODIUM CHLORIDE 0.9 % IV SOLN
500.0000 mg/m2 | Freq: Once | INTRAVENOUS | Status: AC
  Administered 2017-11-25: 800 mg via INTRAVENOUS
  Filled 2017-11-25: qty 20

## 2017-11-25 MED ORDER — DEXAMETHASONE SODIUM PHOSPHATE 10 MG/ML IJ SOLN
10.0000 mg | Freq: Once | INTRAMUSCULAR | Status: AC
  Administered 2017-11-25: 10 mg via INTRAVENOUS

## 2017-11-25 MED ORDER — DEXAMETHASONE SODIUM PHOSPHATE 10 MG/ML IJ SOLN
INTRAMUSCULAR | Status: AC
  Filled 2017-11-25: qty 1

## 2017-11-25 MED ORDER — SODIUM CHLORIDE 0.9% FLUSH
10.0000 mL | Freq: Once | INTRAVENOUS | Status: AC
  Administered 2017-11-25: 10 mL
  Filled 2017-11-25: qty 10

## 2017-11-25 MED ORDER — ONDANSETRON HCL 8 MG PO TABS
ORAL_TABLET | ORAL | Status: AC
  Filled 2017-11-25: qty 1

## 2017-11-25 NOTE — Patient Instructions (Signed)
[facility] Discharge Instructions for Patients Receiving Chemotherapy  Today you received the following chemotherapy agents:  Alimta.  To help prevent nausea and vomiting after your treatment, we encourage you to take your nausea medication as prescribed.   If you develop nausea and vomiting that is not controlled by your nausea medication, call the clinic.   BELOW ARE SYMPTOMS THAT SHOULD BE REPORTED IMMEDIATELY:  *FEVER GREATER THAN 100.5 F  *CHILLS WITH OR WITHOUT FEVER  NAUSEA AND VOMITING THAT IS NOT CONTROLLED WITH YOUR NAUSEA MEDICATION  *UNUSUAL SHORTNESS OF BREATH  *UNUSUAL BRUISING OR BLEEDING  TENDERNESS IN MOUTH AND THROAT WITH OR WITHOUT PRESENCE OF ULCERS  *URINARY PROBLEMS  *BOWEL PROBLEMS  UNUSUAL RASH Items with * indicate a potential emergency and should be followed up as soon as possible.  Feel free to call the clinic should you have any questions or concerns. The clinic phone number is [phone].  Please show the [place] at check-in to the Emergency Department and triage nurse.

## 2017-11-25 NOTE — Telephone Encounter (Signed)
Printed calendar and avs. °

## 2017-11-25 NOTE — Progress Notes (Signed)
[place] [phone]   [phone]  OFFICE PROGRESS NOTE  Danielle Sanders, MD [place] [address]  DIAGNOSIS: Metastatic non-small cell lung cancer initially diagnosed as locally advanced stage IIIB with a right Pancoast tumor involving the vertebral body as well as prominent canal invasion with spinal cord compression in October 2007. The patient also has metastatic disease to the brain in April 2008.  PRIOR THERAPY: 1. Status post concurrent chemoradiation with weekly carboplatin and paclitaxel, last dose was given November 18, 2005. 2. 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 3. Status post gamma knife radiotherapy to a solitary brain lesion located in the superior frontal area of the brain at [facility] in April of 2008. 4. Status post palliative radiotherapy to the lateral abdominal wall metastatic lesion under the care of Dr. Lisbeth Renshaw, completed March of 2009. 5. Status post 6 cycles of systemic chemotherapy with carboplatin and Alimta. Last dose was given July 26, 2007 with disease stabilization. 6. Gamma knife stereotactic radiotherapy to 2 brain lesions one involving the right frontal dural based as well as right parietal lesion performed on 05/07/2012 under the care of Dr. Vallarie Mare at [facility].  CURRENT THERAPY: Maintenance systemic chemotherapy with Alimta 500 MG/M2 every 3 weeks, status post 177 cycles.  INTERVAL HISTORY: Danielle [name] 67 y.o. female returns to the clinic today for follow-up visit accompanied by her daughter.  The patient is feeling fine today with no specific complaints.  She had skin surgery on the nose and face performed recently but the biopsy results are still pending.  The patient denied having any significant weight loss or night sweats.  She has no nausea, vomiting, diarrhea or constipation.  She has  no headache or visual changes.  She continues to tolerate her treatment with Alimta fairly well.  The patient is here today for evaluation before starting cycle #178.  MEDICAL HISTORY: Past Medical History:  Diagnosis Date  . Anemia   . 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 CVA (cerebral vascular accident) ([place]) 04/2014   pt states had 2 cva's within 2 wks  . DJD (degenerative joint disease), cervical   . Fibromyalgia   . H/O: pneumonia   . History of tobacco abuse quit 10/08   on nicotine patch  . Hypertension   . Hypokalemia   . Lung cancer ([place]) dx'd 09/2005   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 had chemo and radiation for lung ca  . Thrush 12/11/2010    ALLERGIES:  is allergic to contrast media [iodinated diagnostic agents]; iohexol; sulfa drugs cross reactors; and co-trimoxazole injection [sulfamethoxazole-trimethoprim].  MEDICATIONS:  Current Outpatient Medications  Medication Sig Dispense Refill  . amLODipine (NORVASC) 10 MG tablet TAKE 1 TABLET (10 MG TOTAL) BY MOUTH DAILY. 90 tablet 1  . aspirin EC 325 MG tablet TAKE 1 TABLET BY MOUTH EVERY DAY 90 tablet 0  . atorvastatin (LIPITOR) 40 MG tablet TAKE 1 TABLET BY MOUTH EVERY DAY AT 6 PM 90 tablet 3  . diphenhydrAMINE (BENADRYL) 25 MG tablet Take 1 tablet (25 mg total) by mouth as directed. 2 hours before CT scan. 5 tablet 0  . FeFum-FePoly-FA-B Cmp-C-Biot (INTEGRA PLUS) CAPS Take 1 capsule by mouth every morning. 90 capsule 2  . fluticasone (FLONASE) 50 MCG/ACT nasal spray Place 1-2 sprays into both nostrils daily as needed. 16 g 5  . folic acid (  FOLVITE) 1 MG tablet TAKE 1 TABLET BY MOUTH EVERY DAY 90 tablet 1  . furosemide (LASIX) 20 MG tablet TAKE 1 TABLET (20 MG TOTAL) BY MOUTH DAILY. AS NEEDED FOR SWELLING. 20 tablet 0  . lacosamide (VIMPAT) 50 MG TABS tablet Take by mouth.    . levofloxacin (LEVAQUIN) 500 MG tablet Take 1 tablet (500 mg total) by mouth daily. 7 tablet 0  . lidocaine-prilocaine  (EMLA) cream Apply to Port-A-Cath as directed 1-2 hours prior to chemotherapy 30 g 1  . lisinopril (PRINIVIL,ZESTRIL) 20 MG tablet TAKE 2 TABLETS (40 MG TOTAL) BY MOUTH DAILY. 180 tablet 1  . morphine (MS CONTIN) 60 MG 12 hr tablet Take 1 tablet (60 mg total) by mouth 2 (two) times daily. 60 tablet 0  . morphine (MSIR) 30 MG tablet Take 1 tablet (30 mg total) by mouth every 4 (four) hours as needed (breakthrough pain). 60 tablet 0  . Olopatadine HCl 0.2 % SOLN INSTILL 1 DROP INTO AFFECTED EYE(S) EVERY DAY 2.5 mL 1  . ondansetron (ZOFRAN) 8 MG tablet TAKE 1 TABLET BY MOUTH EVERY 8 HOURS AS NEEDED FOR FOR NAUSEA OR VOMITING 30 tablet 0  . predniSONE (DELTASONE) 50 MG tablet 1 tablet by mouth 13 hours ,7 hours and 1 hour  before CT scan 3 tablet 3  . triamcinolone (KENALOG) 0.025 % ointment Apply 1 application topically 2 (two) times daily. 30 g 0   No current facility-administered medications for this visit.     SURGICAL HISTORY:  Past Surgical History:  Procedure Laterality Date  . CERVICAL LAMINECTOMY  1995  . KNEE SURGERY  1990   Left x 2  . 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 LOOP RECORDER IMPLANT N/A 04/19/2014   Procedure: LOOP RECORDER IMPLANT;  Surgeon: Thompson Grayer, MD;  Location: [facility] CATH LAB;  Service: Cardiovascular;  Laterality: N/A;  . OTHER SURGICAL HISTORY  2008   Gamma knife surgery to remove brain met   . PORTACATH PLACEMENT  -[name]   TIP IN CAVOATRIAL JUNCTION  . TEE WITHOUT CARDIOVERSION N/A 04/19/2014   Procedure: TRANSESOPHAGEAL ECHOCARDIOGRAM (TEE);  Surgeon: Larey Dresser, MD;  Location: [facility] ENDOSCOPY;  Service: Cardiovascular;  Laterality: N/A;    REVIEW OF SYSTEMS:  A comprehensive review of systems was negative.   PHYSICAL EXAMINATION: General appearance: alert, cooperative and no distress Head: Normocephalic, without obvious abnormality, atraumatic Neck: no adenopathy, no JVD, supple, symmetrical, trachea midline and thyroid  not enlarged, symmetric, no tenderness/mass/nodules Lymph nodes: Cervical, supraclavicular, and axillary nodes normal. Resp: clear to auscultation bilaterally Back: symmetric, no curvature. ROM normal. No CVA tenderness. Cardio: regular rate and rhythm, S1, S2 normal, no murmur, click, rub or gallop GI: soft, non-tender; bowel sounds normal; no masses,  no organomegaly Extremities: extremities normal, atraumatic, no cyanosis or edema  ECOG PERFORMANCE STATUS: 1 - Symptomatic but completely ambulatory  Blood pressure (!) 108/56, pulse 77, temperature 98.1 F (36.7 C), temperature source Oral, resp. rate 18, height 5' 2.5" (1.588 m), weight 114 lb 6.4 oz (51.9 kg), SpO2 100 %.  LABORATORY DATA: Lab Results  Component Value Date   WBC 9.7 11/25/2017   HGB 10.7 (L) 11/25/2017   HCT 33.8 (L) 11/25/2017   MCV 98.8 11/25/2017   PLT 300 11/25/2017      Chemistry      Component Value Date/Time   NA 141 11/04/2017 0830   NA 142 12/24/2016 0816   K 4.0 11/04/2017  0830   K 4.4 12/24/2016 0816   CL 105 11/04/2017 0830   CL 106 06/30/2012 1004   CO2 27 11/04/2017 0830   CO2 28 12/24/2016 0816   BUN 9 11/04/2017 0830   BUN 15.8 12/24/2016 0816   CREATININE 0.81 11/04/2017 0830   CREATININE 0.87 07/22/2017 0826   CREATININE 1.1 12/24/2016 0816      Component Value Date/Time   CALCIUM 9.4 11/04/2017 0830   CALCIUM 9.6 12/24/2016 0816   ALKPHOS 76 11/04/2017 0830   ALKPHOS 80 12/24/2016 0816   AST 18 11/04/2017 0830   AST 20 07/22/2017 0826   AST 20 12/24/2016 0816   ALT 14 11/04/2017 0830   ALT 11 07/22/2017 0826   ALT 11 12/24/2016 0816   BILITOT 0.4 11/04/2017 0830   BILITOT 0.4 07/22/2017 0826   BILITOT 0.34 12/24/2016 0816       RADIOGRAPHIC STUDIES: No results found.  ASSESSMENT AND PLAN: This is a very pleasant 67 years old white female with metastatic non-small cell lung cancer diagnosed in October 2007 status post concurrent chemoradiation followed by  consolidation chemotherapy and she is currently on maintenance treatment with single agent Alimta status post 177 cycles. The patient is feeling fine today with no concerning complaints and she continues to tolerate her treatment well. I recommended for her to proceed with cycle #178 today as scheduled. I will see her back for follow-up visit in 3 weeks for evaluation before starting the next cycle of her treatment. The patient was advised to call immediately if she has any concerning symptoms in the interval. The patient voices understanding of current disease status and treatment options and is in agreement with the current care plan. All questions were answered. The patient knows to call the clinic with any problems, questions or concerns. We can certainly see the patient much sooner if necessary.  Disclaimer: This note was dictated with voice recognition software. Similar sounding words can inadvertently be transcribed and may not be corrected upon review.

## 2017-12-09 ENCOUNTER — Telehealth: Payer: Self-pay | Admitting: Internal Medicine

## 2017-12-09 DIAGNOSIS — C4442 Squamous cell carcinoma of skin of scalp and neck: Secondary | ICD-10-CM | POA: Diagnosis not present

## 2017-12-09 NOTE — Telephone Encounter (Signed)
MM PAL 12/10 - moved f/u to LT. Spoke with patient re change and new time.

## 2017-12-12 ENCOUNTER — Other Ambulatory Visit: Payer: Self-pay | Admitting: Internal Medicine

## 2017-12-12 DIAGNOSIS — C3411 Malignant neoplasm of upper lobe, right bronchus or lung: Secondary | ICD-10-CM

## 2017-12-12 DIAGNOSIS — C7931 Secondary malignant neoplasm of brain: Secondary | ICD-10-CM

## 2017-12-15 ENCOUNTER — Other Ambulatory Visit: Payer: Self-pay | Admitting: Internal Medicine

## 2017-12-15 DIAGNOSIS — C7931 Secondary malignant neoplasm of brain: Secondary | ICD-10-CM

## 2017-12-15 DIAGNOSIS — C3491 Malignant neoplasm of unspecified part of right bronchus or lung: Secondary | ICD-10-CM

## 2017-12-15 DIAGNOSIS — C3411 Malignant neoplasm of upper lobe, right bronchus or lung: Secondary | ICD-10-CM

## 2017-12-16 ENCOUNTER — Ambulatory Visit: Payer: Medicare Other

## 2017-12-16 ENCOUNTER — Inpatient Hospital Stay: Payer: Medicare Other

## 2017-12-16 ENCOUNTER — Inpatient Hospital Stay: Payer: Medicare Other | Attending: Internal Medicine

## 2017-12-16 ENCOUNTER — Ambulatory Visit: Payer: Medicare Other | Admitting: Internal Medicine

## 2017-12-16 ENCOUNTER — Other Ambulatory Visit: Payer: Medicare Other

## 2017-12-16 ENCOUNTER — Encounter: Payer: Self-pay | Admitting: Nurse Practitioner

## 2017-12-16 ENCOUNTER — Inpatient Hospital Stay (HOSPITAL_BASED_OUTPATIENT_CLINIC_OR_DEPARTMENT_OTHER): Payer: Medicare Other | Admitting: Nurse Practitioner

## 2017-12-16 DIAGNOSIS — C7931 Secondary malignant neoplasm of brain: Secondary | ICD-10-CM | POA: Insufficient documentation

## 2017-12-16 DIAGNOSIS — Z5111 Encounter for antineoplastic chemotherapy: Secondary | ICD-10-CM | POA: Diagnosis not present

## 2017-12-16 DIAGNOSIS — C3411 Malignant neoplasm of upper lobe, right bronchus or lung: Secondary | ICD-10-CM | POA: Insufficient documentation

## 2017-12-16 DIAGNOSIS — C3490 Malignant neoplasm of unspecified part of unspecified bronchus or lung: Secondary | ICD-10-CM

## 2017-12-16 DIAGNOSIS — Z95828 Presence of other vascular implants and grafts: Secondary | ICD-10-CM

## 2017-12-16 DIAGNOSIS — C3410 Malignant neoplasm of upper lobe, unspecified bronchus or lung: Secondary | ICD-10-CM

## 2017-12-16 LAB — COMPREHENSIVE METABOLIC PANEL
ALBUMIN: 3 g/dL — AB (ref 3.5–5.0)
ALT: 13 U/L (ref 0–44)
ANION GAP: 6 (ref 5–15)
AST: 22 U/L (ref 15–41)
Alkaline Phosphatase: 68 U/L (ref 38–126)
BILIRUBIN TOTAL: 0.3 mg/dL (ref 0.3–1.2)
BUN: 16 mg/dL (ref 8–23)
CHLORIDE: 106 mmol/L (ref 98–111)
CO2: 28 mmol/L (ref 22–32)
Calcium: 8.9 mg/dL (ref 8.9–10.3)
Creatinine, Ser: 1.06 mg/dL — ABNORMAL HIGH (ref 0.44–1.00)
GFR calc Af Amer: >60 mL/min (ref >60)
GFR calc non Af Amer: 55 mL/min — ABNORMAL LOW (ref >60)
GLUCOSE: 170 mg/dL — AB (ref 70–99)
POTASSIUM: 4.4 mmol/L (ref 3.5–5.1)
Sodium: 140 mmol/L (ref 135–145)
Total Protein: 6 g/dL — ABNORMAL LOW (ref 6.5–8.1)

## 2017-12-16 LAB — CBC WITH DIFFERENTIAL/PLATELET
ABS IMMATURE GRANULOCYTES: 0.03 10*3/uL (ref 0.00–0.07)
Basophils Absolute: 0 10*3/uL (ref 0.0–0.1)
Basophils Relative: 0 %
Eosinophils Absolute: 0.1 10*3/uL (ref 0.0–0.5)
Eosinophils Relative: 1 %
HCT: 32.7 % — ABNORMAL LOW (ref 36.0–46.0)
HEMOGLOBIN: 10.4 g/dL — AB (ref 12.0–15.0)
IMMATURE GRANULOCYTES: 0 %
LYMPHS PCT: 26 %
Lymphs Abs: 2 10*3/uL (ref 0.7–4.0)
MCH: 31.4 pg (ref 26.0–34.0)
MCHC: 31.8 g/dL (ref 30.0–36.0)
MCV: 98.8 fL (ref 80.0–100.0)
MONO ABS: 0.7 10*3/uL (ref 0.1–1.0)
MONOS PCT: 9 %
NEUTROS ABS: 4.8 10*3/uL (ref 1.7–7.7)
NEUTROS PCT: 64 %
PLATELETS: 274 10*3/uL (ref 150–400)
RBC: 3.31 MIL/uL — ABNORMAL LOW (ref 3.87–5.11)
RDW: 14.4 % (ref 11.5–15.5)
WBC: 7.6 10*3/uL (ref 4.0–10.5)
nRBC: 0 % (ref 0.0–0.2)

## 2017-12-16 MED ORDER — DEXAMETHASONE SODIUM PHOSPHATE 10 MG/ML IJ SOLN
10.0000 mg | Freq: Once | INTRAMUSCULAR | Status: AC
  Administered 2017-12-16: 10 mg via INTRAVENOUS

## 2017-12-16 MED ORDER — MORPHINE SULFATE 30 MG PO TABS: 30.0000 mg | ORAL_TABLET | ORAL | 0 refills | Status: DC | PRN

## 2017-12-16 MED ORDER — MORPHINE SULFATE ER 60 MG PO TBCR: 60.0000 mg | EXTENDED_RELEASE_TABLET | Freq: Two times a day (BID) | ORAL | 0 refills | Status: DC

## 2017-12-16 MED ORDER — SODIUM CHLORIDE 0.9 % IV SOLN
Freq: Once | INTRAVENOUS | Status: AC
  Administered 2017-12-16: 13:00:00 via INTRAVENOUS
  Filled 2017-12-16: qty 250

## 2017-12-16 MED ORDER — SODIUM CHLORIDE 0.9% FLUSH
10.0000 mL | Freq: Once | INTRAVENOUS | Status: AC
  Administered 2017-12-16: 10 mL
  Filled 2017-12-16: qty 10

## 2017-12-16 MED ORDER — SODIUM CHLORIDE 0.9 % IV SOLN
500.0000 mg/m2 | Freq: Once | INTRAVENOUS | Status: AC
  Administered 2017-12-16: 800 mg via INTRAVENOUS
  Filled 2017-12-16: qty 20

## 2017-12-16 MED ORDER — ONDANSETRON HCL 8 MG PO TABS
ORAL_TABLET | ORAL | Status: AC
  Filled 2017-12-16: qty 1

## 2017-12-16 MED ORDER — SODIUM CHLORIDE 0.9 % IJ SOLN
10.0000 mL | INTRAMUSCULAR | Status: DC | PRN
  Administered 2017-12-16: 10 mL
  Filled 2017-12-16: qty 10

## 2017-12-16 MED ORDER — HEPARIN SOD (PORK) LOCK FLUSH 100 UNIT/ML IV SOLN
500.0000 [IU] | Freq: Once | INTRAVENOUS | Status: AC | PRN
  Administered 2017-12-16: 500 [IU]
  Filled 2017-12-16: qty 5

## 2017-12-16 MED ORDER — ONDANSETRON HCL 8 MG PO TABS
8.0000 mg | ORAL_TABLET | Freq: Once | ORAL | Status: AC
  Administered 2017-12-16: 8 mg via ORAL

## 2017-12-16 MED ORDER — DEXAMETHASONE SODIUM PHOSPHATE 10 MG/ML IJ SOLN
INTRAMUSCULAR | Status: AC
  Filled 2017-12-16: qty 1

## 2017-12-16 NOTE — Progress Notes (Signed)
[place] OFFICE PROGRESS NOTE    DIAGNOSIS: Metastatic non-small cell lung cancer initially diagnosed as locally advanced stage IIIB with a right Pancoast tumor involving the vertebral body as well as prominent canal invasion with spinal cord compression in October 2007. The patient also has metastatic disease to the brain in April 2008.  PRIOR THERAPY: 1. Status post concurrent chemoradiation with weekly carboplatin and paclitaxel, last dose was given November 18, 2005. 2. 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 3. Status post gamma knife radiotherapy to a solitary brain lesion located in the superior frontal area of the brain at [facility] in April of 2008. 4. Status post palliative radiotherapy to the lateral abdominal wall metastatic lesion under the care of Dr. Lisbeth Renshaw, completed March of 2009. 5. Status post 6 cycles of systemic chemotherapy with carboplatin and Alimta. Last dose was given July 26, 2007 with disease stabilization. 6. Gamma knife stereotactic radiotherapy to 2 brain lesions one involving the right frontal dural based as well as right parietal lesion performed on 05/07/2012 under the care of Dr. Vallarie Mare at [facility].  CURRENT THERAPY: Maintenance systemic chemotherapy with Alimta 500 MG/M2 every 3 weeks, status post 178 cycles.   INTERVAL HISTORY:   Danielle [name] returns as scheduled.  She completed cycle 178 of Alimta 11/25/2017.  She tends to have mild nausea/vomiting after treatment.  Zofran is effective.  No mouth sores.  No diarrhea.  No rash.  She has stable dyspnea on exertion.  No cough or fever.  She reports having a skin cancer removed from the right forehead earlier this week.  She is scheduled to have a skin cancer removed from her nose in January.  She continues MS Contin twice daily and MSIR as needed for back pain.  Objective:  Vital signs in last 24  hours:  Blood pressure (!) 133/59, pulse 78, temperature 98.4 F (36.9 C), temperature source Oral, resp. rate 17, height 5' 2.5" (1.588 m), weight 118 lb 3.2 oz (53.6 kg), SpO2 96 %.    HEENT: No thrush or ulcers. Lymphatics: No palpable cervical or supraclavicular lymph nodes. Resp: Lungs clear bilaterally. Cardio: Regular rate and rhythm. GI: Abdomen soft and nontender.  No hepatomegaly. Vascular: Trace edema at the lower legs bilaterally. Neuro: Alert and oriented. Skin: Bandage at the right forehead, not removed.  She also has a bandage on her right nose which appears to be covering a superficial ulcer.   Lab Results:  Lab Results  Component Value Date   WBC 7.6 12/16/2017   HGB 10.4 (L) 12/16/2017   HCT 32.7 (L) 12/16/2017   MCV 98.8 12/16/2017   PLT 274 12/16/2017   NEUTROABS 4.8 12/16/2017    Imaging:  No results found.  Medications: I have reviewed the patient's current medications.  Assessment/Plan: 1. Metastatic non-small cell lung cancer diagnosed October 2007 status post concurrent chemoradiation followed by consolidation chemotherapy.  She is currently on maintenance treatment with single agent Alimta status post 178 cycles.  Disposition: Danielle [name] appears stable.  She has completed 178 cycles of Alimta.  Plan to proceed with cycle 179 today as scheduled.  We contacted the [facility] surgery Center (Dr. Winifred Olive) regarding the recent skin cancer surgery/graft and confirmed okay to proceed with chemotherapy today as planned.  She will return for lab, follow-up and the next cycle of Alimta in 3 weeks.  She will contact the office in the interim with any problems.  Ned Card ANP/GNP-BC   12/16/2017  1:03 PM

## 2017-12-16 NOTE — Patient Instructions (Signed)
[place] Discharge Instructions for Patients Receiving Chemotherapy  Today you received the following chemotherapy agents:  Alimta.  To help prevent nausea and vomiting after your treatment, we encourage you to take your nausea medication as prescribed.   If you develop nausea and vomiting that is not controlled by your nausea medication, call the clinic.   BELOW ARE SYMPTOMS THAT SHOULD BE REPORTED IMMEDIATELY:  *FEVER GREATER THAN 100.5 F  *CHILLS WITH OR WITHOUT FEVER  NAUSEA AND VOMITING THAT IS NOT CONTROLLED WITH YOUR NAUSEA MEDICATION  *UNUSUAL SHORTNESS OF BREATH  *UNUSUAL BRUISING OR BLEEDING  TENDERNESS IN MOUTH AND THROAT WITH OR WITHOUT PRESENCE OF ULCERS  *URINARY PROBLEMS  *BOWEL PROBLEMS  UNUSUAL RASH Items with * indicate a potential emergency and should be followed up as soon as possible.  Feel free to call the clinic should you have any questions or concerns. The clinic phone number is [phone].  Please show the [place] at check-in to the Emergency Department and triage nurse.

## 2017-12-17 ENCOUNTER — Telehealth: Payer: Self-pay | Admitting: Internal Medicine

## 2017-12-17 NOTE — Progress Notes (Signed)
PA for EMLA cream has been submitted.

## 2017-12-17 NOTE — Telephone Encounter (Signed)
Per 12/10 no los °

## 2017-12-19 ENCOUNTER — Other Ambulatory Visit: Payer: Self-pay | Admitting: Medical Oncology

## 2017-12-19 ENCOUNTER — Other Ambulatory Visit: Payer: Self-pay | Admitting: Internal Medicine

## 2017-12-19 DIAGNOSIS — C3411 Malignant neoplasm of upper lobe, right bronchus or lung: Secondary | ICD-10-CM

## 2017-12-19 MED ORDER — FOLIC ACID 1 MG PO TABS: 1.0000 mg | ORAL_TABLET | Freq: Every day | ORAL | 0 refills | Status: DC

## 2017-12-26 ENCOUNTER — Other Ambulatory Visit: Payer: Self-pay | Admitting: Internal Medicine

## 2017-12-26 ENCOUNTER — Other Ambulatory Visit: Payer: Self-pay | Admitting: Family Medicine

## 2017-12-26 DIAGNOSIS — C7931 Secondary malignant neoplasm of brain: Secondary | ICD-10-CM

## 2018-01-06 ENCOUNTER — Encounter: Payer: Self-pay | Admitting: Internal Medicine

## 2018-01-06 ENCOUNTER — Inpatient Hospital Stay: Payer: Medicare Other

## 2018-01-06 ENCOUNTER — Inpatient Hospital Stay (HOSPITAL_BASED_OUTPATIENT_CLINIC_OR_DEPARTMENT_OTHER): Payer: Medicare Other | Admitting: Internal Medicine

## 2018-01-06 ENCOUNTER — Telehealth: Payer: Self-pay | Admitting: Internal Medicine

## 2018-01-06 VITALS — BP 127/78 | HR 73 | Temp 97.5°F | Resp 18 | Ht 62.0 in | Wt 115.3 lb

## 2018-01-06 DIAGNOSIS — C3411 Malignant neoplasm of upper lobe, right bronchus or lung: Secondary | ICD-10-CM

## 2018-01-06 DIAGNOSIS — Z5111 Encounter for antineoplastic chemotherapy: Secondary | ICD-10-CM | POA: Diagnosis not present

## 2018-01-06 DIAGNOSIS — C3490 Malignant neoplasm of unspecified part of unspecified bronchus or lung: Secondary | ICD-10-CM

## 2018-01-06 DIAGNOSIS — D6481 Anemia due to antineoplastic chemotherapy: Secondary | ICD-10-CM

## 2018-01-06 DIAGNOSIS — C7931 Secondary malignant neoplasm of brain: Secondary | ICD-10-CM

## 2018-01-06 DIAGNOSIS — T451X5A Adverse effect of antineoplastic and immunosuppressive drugs, initial encounter: Secondary | ICD-10-CM

## 2018-01-06 DIAGNOSIS — Z95828 Presence of other vascular implants and grafts: Secondary | ICD-10-CM

## 2018-01-06 LAB — CBC WITH DIFFERENTIAL/PLATELET
Abs Immature Granulocytes: 0.05 10*3/uL (ref 0.00–0.07)
Basophils Absolute: 0 10*3/uL (ref 0.0–0.1)
Basophils Relative: 0 %
Eosinophils Absolute: 0 10*3/uL (ref 0.0–0.5)
Eosinophils Relative: 0 %
HCT: 35.9 % — ABNORMAL LOW (ref 36.0–46.0)
Hemoglobin: 11.3 g/dL — ABNORMAL LOW (ref 12.0–15.0)
Immature Granulocytes: 0 %
Lymphocytes Relative: 10 %
Lymphs Abs: 1.2 10*3/uL (ref 0.7–4.0)
MCH: 31 pg (ref 26.0–34.0)
MCHC: 31.5 g/dL (ref 30.0–36.0)
MCV: 98.6 fL (ref 80.0–100.0)
MONO ABS: 1.2 10*3/uL — AB (ref 0.1–1.0)
Monocytes Relative: 9 %
NEUTROS ABS: 10.4 10*3/uL — AB (ref 1.7–7.7)
Neutrophils Relative %: 81 %
Platelets: 320 10*3/uL (ref 150–400)
RBC: 3.64 MIL/uL — ABNORMAL LOW (ref 3.87–5.11)
RDW: 14.1 % (ref 11.5–15.5)
WBC: 12.9 10*3/uL — ABNORMAL HIGH (ref 4.0–10.5)
nRBC: 0 % (ref 0.0–0.2)

## 2018-01-06 LAB — COMPREHENSIVE METABOLIC PANEL
ALT: 11 U/L (ref 0–44)
AST: 20 U/L (ref 15–41)
Albumin: 3.2 g/dL — ABNORMAL LOW (ref 3.5–5.0)
Alkaline Phosphatase: 82 U/L (ref 38–126)
Anion gap: 9 (ref 5–15)
BUN: 12 mg/dL (ref 8–23)
CO2: 26 mmol/L (ref 22–32)
Calcium: 9.5 mg/dL (ref 8.9–10.3)
Chloride: 108 mmol/L (ref 98–111)
Creatinine, Ser: 0.85 mg/dL (ref 0.44–1.00)
GFR calc Af Amer: >60 mL/min (ref >60)
GFR calc non Af Amer: >60 mL/min (ref >60)
Glucose, Bld: 151 mg/dL — ABNORMAL HIGH (ref 70–99)
Potassium: 4.4 mmol/L (ref 3.5–5.1)
Sodium: 143 mmol/L (ref 135–145)
Total Bilirubin: 0.3 mg/dL (ref 0.3–1.2)
Total Protein: 6.7 g/dL (ref 6.5–8.1)

## 2018-01-06 MED ORDER — DEXAMETHASONE SODIUM PHOSPHATE 10 MG/ML IJ SOLN
INTRAMUSCULAR | Status: AC
  Filled 2018-01-06: qty 1

## 2018-01-06 MED ORDER — CYANOCOBALAMIN 1000 MCG/ML IJ SOLN
1000.0000 ug | Freq: Once | INTRAMUSCULAR | Status: AC
  Administered 2018-01-06: 1000 ug via INTRAMUSCULAR

## 2018-01-06 MED ORDER — SODIUM CHLORIDE 0.9 % IJ SOLN: 10.0000 mL | INTRAMUSCULAR | Status: DC | PRN

## 2018-01-06 MED ORDER — FUROSEMIDE 20 MG PO TABS: 20.0000 mg | ORAL_TABLET | Freq: Every day | ORAL | 0 refills | Status: DC

## 2018-01-06 MED ORDER — SODIUM CHLORIDE 0.9 % IV SOLN
500.0000 mg/m2 | Freq: Once | INTRAVENOUS | Status: AC
  Administered 2018-01-06: 800 mg via INTRAVENOUS
  Filled 2018-01-06: qty 20

## 2018-01-06 MED ORDER — SODIUM CHLORIDE 0.9% FLUSH
10.0000 mL | Freq: Once | INTRAVENOUS | Status: AC
  Administered 2018-01-06: 10 mL
  Filled 2018-01-06: qty 10

## 2018-01-06 MED ORDER — SODIUM CHLORIDE 0.9 % IV SOLN
Freq: Once | INTRAVENOUS | Status: AC
  Administered 2018-01-06: 10:00:00 via INTRAVENOUS
  Filled 2018-01-06: qty 250

## 2018-01-06 MED ORDER — DEXAMETHASONE SODIUM PHOSPHATE 10 MG/ML IJ SOLN
10.0000 mg | Freq: Once | INTRAMUSCULAR | Status: AC
  Administered 2018-01-06: 10 mg via INTRAVENOUS

## 2018-01-06 MED ORDER — ONDANSETRON HCL 8 MG PO TABS
8.0000 mg | ORAL_TABLET | Freq: Once | ORAL | Status: AC
  Administered 2018-01-06: 8 mg via ORAL

## 2018-01-06 MED ORDER — CYANOCOBALAMIN 1000 MCG/ML IJ SOLN
INTRAMUSCULAR | Status: AC
  Filled 2018-01-06: qty 1

## 2018-01-06 MED ORDER — ONDANSETRON HCL 8 MG PO TABS
ORAL_TABLET | ORAL | Status: AC
  Filled 2018-01-06: qty 1

## 2018-01-06 MED ORDER — SODIUM CHLORIDE 0.9% FLUSH
10.0000 mL | Freq: Once | INTRAVENOUS | Status: AC
  Administered 2018-01-06: 10 mL via INTRAVENOUS
  Filled 2018-01-06: qty 10

## 2018-01-06 MED ORDER — HEPARIN SOD (PORK) LOCK FLUSH 100 UNIT/ML IV SOLN
500.0000 [IU] | Freq: Once | INTRAVENOUS | Status: AC | PRN
  Administered 2018-01-06: 500 [IU]
  Filled 2018-01-06: qty 5

## 2018-01-06 NOTE — Telephone Encounter (Signed)
Printed calendar and avs. °

## 2018-01-06 NOTE — Progress Notes (Signed)
[place] Telephone:([phone]   [phone]  OFFICE PROGRESS NOTE  [name], MD [place] [address]  DIAGNOSIS: Metastatic non-small cell lung cancer initially diagnosed as locally advanced stage IIIB with a right Pancoast tumor involving the vertebral body as well as prominent canal invasion with spinal cord compression in October 2007. The patient also has metastatic disease to the brain in April 2008.  PRIOR THERAPY: 1. Status post concurrent chemoradiation with weekly carboplatin and paclitaxel, last dose was given November 18, 2005. 2. 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 3. Status post gamma knife radiotherapy to a solitary brain lesion located in the superior frontal area of the brain at [facility] in April of 2008. 4. Status post palliative radiotherapy to the lateral abdominal wall metastatic lesion under the care of Dr. Lisbeth Renshaw, completed March of 2009. 5. Status post 6 cycles of systemic chemotherapy with carboplatin and Alimta. Last dose was given July 26, 2007 with disease stabilization. 6. Gamma knife stereotactic radiotherapy to 2 brain lesions one involving the right frontal dural based as well as right parietal lesion performed on 05/07/2012 under the care of Dr. Vallarie Mare at [facility].  CURRENT THERAPY: Maintenance systemic chemotherapy with Alimta 500 MG/M2 every 3 weeks, status post 179 cycles.  INTERVAL HISTORY: Danielle [name] 67 y.o. female returns to the clinic today for follow-up visit accompanied by her daughter.  The patient is feeling fine today with no concerning complaints.  She denied having any chest pain, shortness of breath, cough or hemoptysis.  She denied having any fever or chills.  She has a swelling of the lower extremities and she is requesting refill of Lasix.  She has no nausea, vomiting, diarrhea  or constipation.  The patient is here today for evaluation before starting cycle #180.  MEDICAL HISTORY: Past Medical History:  Diagnosis Date  . Anemia   . 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 CVA (cerebral vascular accident) ([place]) 04/2014   pt states had 2 cva's within 2 wks  . DJD (degenerative joint disease), cervical   . Fibromyalgia   . H/O: pneumonia   . History of tobacco abuse quit 10/08   on nicotine patch  . Hypertension   . Hypokalemia   . Lung cancer ([place]) dx'd 09/2005   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 had chemo and radiation for lung ca  . Thrush 12/11/2010    ALLERGIES:  is allergic to contrast media [iodinated diagnostic agents]; iohexol; sulfa drugs cross reactors; and co-trimoxazole injection [sulfamethoxazole-trimethoprim].  MEDICATIONS:  Current Outpatient Medications  Medication Sig Dispense Refill  . amLODipine (NORVASC) 10 MG tablet TAKE 1 TABLET (10 MG TOTAL) BY MOUTH DAILY. 90 tablet 1  . aspirin EC 325 MG tablet TAKE 1 TABLET BY MOUTH EVERY DAY 90 tablet 0  . atorvastatin (LIPITOR) 40 MG tablet TAKE 1 TABLET BY MOUTH EVERY DAY AT 6 PM 90 tablet 3  . diphenhydrAMINE (BENADRYL) 25 MG tablet Take 1 tablet (25 mg total) by mouth as directed. 2 hours before CT scan. 5 tablet 0  . FeFum-FePoly-FA-B Cmp-C-Biot (INTEGRA PLUS) CAPS Take 1 capsule by mouth every morning. 90 capsule 2  . fluticasone (FLONASE) 50 MCG/ACT nasal spray Place 1-2 sprays into both nostrils daily as needed. 16 g 5  . folic acid (FOLVITE) 1 MG tablet TAKE 1 TABLET BY MOUTH EVERY DAY 90 tablet  1  . folic acid (FOLVITE) 1 MG tablet Take 1 tablet (1 mg total) by mouth daily. 90 tablet 0  . furosemide (LASIX) 20 MG tablet TAKE 1 TABLET (20 MG TOTAL) BY MOUTH DAILY. AS NEEDED FOR SWELLING. 20 tablet 0  . lacosamide (VIMPAT) 50 MG TABS tablet Take by mouth.    . levofloxacin (LEVAQUIN) 500 MG tablet Take 1 tablet (500 mg total) by mouth daily. 7 tablet 0  .  lidocaine-prilocaine (EMLA) cream APPLY TO PORT-A-CATH AS DIRECTED 1-2 HOURS PRIOR TO CHEMOTHERAPY 30 g 1  . lisinopril (PRINIVIL,ZESTRIL) 20 MG tablet TAKE 2 TABLETS (40 MG TOTAL) BY MOUTH DAILY. 180 tablet 3  . morphine (MS CONTIN) 60 MG 12 hr tablet Take 1 tablet (60 mg total) by mouth 2 (two) times daily. 60 tablet 0  . morphine (MSIR) 30 MG tablet Take 1 tablet (30 mg total) by mouth every 4 (four) hours as needed (breakthrough pain). 60 tablet 0  . Olopatadine HCl 0.2 % SOLN INSTILL 1 DROP INTO AFFECTED EYE(S) EVERY DAY 2.5 mL 1  . ondansetron (ZOFRAN) 8 MG tablet TAKE 1 TABLET BY MOUTH EVERY 8 HOURS AS NEEDED FOR FOR NAUSEA OR VOMITING 30 tablet 0  . predniSONE (DELTASONE) 50 MG tablet 1 tablet by mouth 13 hours ,7 hours and 1 hour  before CT scan 3 tablet 3  . triamcinolone (KENALOG) 0.025 % ointment Apply 1 application topically 2 (two) times daily. 30 g 0   No current facility-administered medications for this visit.     SURGICAL HISTORY:  Past Surgical History:  Procedure Laterality Date  . CERVICAL LAMINECTOMY  1995  . KNEE SURGERY  1990   Left x 2  . 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 LOOP RECORDER IMPLANT N/A 04/19/2014   Procedure: LOOP RECORDER IMPLANT;  Surgeon: Thompson Grayer, MD;  Location: [facility] CATH LAB;  Service: Cardiovascular;  Laterality: N/A;  . OTHER SURGICAL HISTORY  2008   Gamma knife surgery to remove brain met   . PORTACATH PLACEMENT  -[name]   TIP IN CAVOATRIAL JUNCTION  . TEE WITHOUT CARDIOVERSION N/A 04/19/2014   Procedure: TRANSESOPHAGEAL ECHOCARDIOGRAM (TEE);  Surgeon: Larey Dresser, MD;  Location: [facility] ENDOSCOPY;  Service: Cardiovascular;  Laterality: N/A;    REVIEW OF SYSTEMS:  A comprehensive review of systems was negative.   PHYSICAL EXAMINATION: General appearance: alert, cooperative and no distress Head: Normocephalic, without obvious abnormality, atraumatic Neck: no adenopathy, no JVD, supple, symmetrical,  trachea midline and thyroid not enlarged, symmetric, no tenderness/mass/nodules Lymph nodes: Cervical, supraclavicular, and axillary nodes normal. Resp: clear to auscultation bilaterally Back: symmetric, no curvature. ROM normal. No CVA tenderness. Cardio: regular rate and rhythm, S1, S2 normal, no murmur, click, rub or gallop GI: soft, non-tender; bowel sounds normal; no masses,  no organomegaly Extremities: extremities normal, atraumatic, no cyanosis or edema  ECOG PERFORMANCE STATUS: 1 - Symptomatic but completely ambulatory  Blood pressure 127/78, pulse 73, temperature (!) 97.5 F (36.4 C), temperature source Oral, resp. rate 18, height '5\' 2"'  (1.575 m), weight 115 lb 4.8 oz (52.3 kg), SpO2 99 %.  LABORATORY DATA: Lab Results  Component Value Date   WBC 12.9 (H) 01/06/2018   HGB 11.3 (L) 01/06/2018   HCT 35.9 (L) 01/06/2018   MCV 98.6 01/06/2018   PLT 320 01/06/2018      Chemistry      Component Value Date/Time   NA 140 12/16/2017 1105   NA  142 12/24/2016 0816   K 4.4 12/16/2017 1105   K 4.4 12/24/2016 0816   CL 106 12/16/2017 1105   CL 106 06/30/2012 1004   CO2 28 12/16/2017 1105   CO2 28 12/24/2016 0816   BUN 16 12/16/2017 1105   BUN 15.8 12/24/2016 0816   CREATININE 1.06 (H) 12/16/2017 1105   CREATININE 0.87 07/22/2017 0826   CREATININE 1.1 12/24/2016 0816      Component Value Date/Time   CALCIUM 8.9 12/16/2017 1105   CALCIUM 9.6 12/24/2016 0816   ALKPHOS 68 12/16/2017 1105   ALKPHOS 80 12/24/2016 0816   AST 22 12/16/2017 1105   AST 20 07/22/2017 0826   AST 20 12/24/2016 0816   ALT 13 12/16/2017 1105   ALT 11 07/22/2017 0826   ALT 11 12/24/2016 0816   BILITOT 0.3 12/16/2017 1105   BILITOT 0.4 07/22/2017 0826   BILITOT 0.34 12/24/2016 0816       RADIOGRAPHIC STUDIES: No results found.  ASSESSMENT AND PLAN: This is a very pleasant 67 years old white female with metastatic non-small cell lung cancer diagnosed in October 2007 status post concurrent  chemoradiation followed by consolidation chemotherapy and she is currently on maintenance treatment with single agent Alimta status post 179 cycles. The patient continues to tolerate this treatment well with no concerning adverse effects. I recommended for her to proceed with cycle #180 today as scheduled. She will come back for follow-up visit in 3 weeks for evaluation before starting cycle #181. The patient was advised to call immediately if she has any concerning symptoms in the interval. The patient voices understanding of current disease status and treatment options and is in agreement with the current care plan. All questions were answered. The patient knows to call the clinic with any problems, questions or concerns. We can certainly see the patient much sooner if necessary.  Disclaimer: This note was dictated with voice recognition software. Similar sounding words can inadvertently be transcribed and may not be corrected upon review.

## 2018-01-13 ENCOUNTER — Other Ambulatory Visit: Payer: Self-pay | Admitting: Medical Oncology

## 2018-01-13 DIAGNOSIS — D649 Anemia, unspecified: Secondary | ICD-10-CM

## 2018-01-13 MED ORDER — INTEGRA PLUS PO CAPS: 1.0000 | ORAL_CAPSULE | Freq: Every morning | ORAL | 3 refills | Status: DC

## 2018-01-15 ENCOUNTER — Other Ambulatory Visit: Payer: Self-pay | Admitting: Medical Oncology

## 2018-01-15 DIAGNOSIS — C3410 Malignant neoplasm of upper lobe, unspecified bronchus or lung: Secondary | ICD-10-CM

## 2018-01-15 DIAGNOSIS — C3490 Malignant neoplasm of unspecified part of unspecified bronchus or lung: Secondary | ICD-10-CM

## 2018-01-15 MED ORDER — MORPHINE SULFATE ER 60 MG PO TBCR: 60.0000 mg | EXTENDED_RELEASE_TABLET | Freq: Two times a day (BID) | ORAL | 0 refills | Status: DC

## 2018-01-15 MED ORDER — MORPHINE SULFATE 30 MG PO TABS: 30.0000 mg | ORAL_TABLET | ORAL | 0 refills | Status: DC | PRN

## 2018-01-15 NOTE — Progress Notes (Signed)
RX lef I n book for pick up.

## 2018-01-21 ENCOUNTER — Other Ambulatory Visit: Payer: Self-pay | Admitting: Internal Medicine

## 2018-01-27 ENCOUNTER — Ambulatory Visit: Payer: Medicare Other | Admitting: Internal Medicine

## 2018-01-27 ENCOUNTER — Inpatient Hospital Stay: Payer: Medicare Other | Attending: Internal Medicine

## 2018-01-27 ENCOUNTER — Ambulatory Visit: Payer: Medicare Other

## 2018-01-27 ENCOUNTER — Inpatient Hospital Stay: Payer: Medicare Other

## 2018-01-27 ENCOUNTER — Other Ambulatory Visit: Payer: Medicare Other

## 2018-01-27 ENCOUNTER — Inpatient Hospital Stay (HOSPITAL_BASED_OUTPATIENT_CLINIC_OR_DEPARTMENT_OTHER): Payer: Medicare Other | Admitting: Internal Medicine

## 2018-01-27 ENCOUNTER — Encounter: Payer: Self-pay | Admitting: Internal Medicine

## 2018-01-27 ENCOUNTER — Telehealth: Payer: Self-pay | Admitting: Internal Medicine

## 2018-01-27 VITALS — BP 152/55 | HR 96 | Temp 98.2°F | Resp 20 | Ht 62.0 in | Wt 115.2 lb

## 2018-01-27 DIAGNOSIS — Z5111 Encounter for antineoplastic chemotherapy: Secondary | ICD-10-CM

## 2018-01-27 DIAGNOSIS — C3490 Malignant neoplasm of unspecified part of unspecified bronchus or lung: Secondary | ICD-10-CM

## 2018-01-27 DIAGNOSIS — I10 Essential (primary) hypertension: Secondary | ICD-10-CM

## 2018-01-27 DIAGNOSIS — C3411 Malignant neoplasm of upper lobe, right bronchus or lung: Secondary | ICD-10-CM | POA: Insufficient documentation

## 2018-01-27 DIAGNOSIS — Z95828 Presence of other vascular implants and grafts: Secondary | ICD-10-CM

## 2018-01-27 LAB — COMPREHENSIVE METABOLIC PANEL
ALT: 16 U/L (ref 0–44)
AST: 25 U/L (ref 15–41)
Albumin: 3.4 g/dL — ABNORMAL LOW (ref 3.5–5.0)
Alkaline Phosphatase: 77 U/L (ref 38–126)
Anion gap: 9 (ref 5–15)
BUN: 15 mg/dL (ref 8–23)
CO2: 25 mmol/L (ref 22–32)
Calcium: 9.6 mg/dL (ref 8.9–10.3)
Chloride: 106 mmol/L (ref 98–111)
Creatinine, Ser: 0.9 mg/dL (ref 0.44–1.00)
GFR calc Af Amer: >60 mL/min (ref >60)
GFR calc non Af Amer: >60 mL/min (ref >60)
Glucose, Bld: 189 mg/dL — ABNORMAL HIGH (ref 70–99)
Potassium: 4.1 mmol/L (ref 3.5–5.1)
Sodium: 140 mmol/L (ref 135–145)
Total Bilirubin: 0.4 mg/dL (ref 0.3–1.2)
Total Protein: 6.7 g/dL (ref 6.5–8.1)

## 2018-01-27 LAB — CBC WITH DIFFERENTIAL/PLATELET
Abs Immature Granulocytes: 0.05 10*3/uL (ref 0.00–0.07)
Basophils Absolute: 0.1 10*3/uL (ref 0.0–0.1)
Basophils Relative: 0 %
Eosinophils Absolute: 0.1 10*3/uL (ref 0.0–0.5)
Eosinophils Relative: 1 %
HEMATOCRIT: 36.7 % (ref 36.0–46.0)
HEMOGLOBIN: 11.7 g/dL — AB (ref 12.0–15.0)
Immature Granulocytes: 0 %
LYMPHS ABS: 2 10*3/uL (ref 0.7–4.0)
Lymphocytes Relative: 15 %
MCH: 31.8 pg (ref 26.0–34.0)
MCHC: 31.9 g/dL (ref 30.0–36.0)
MCV: 99.7 fL (ref 80.0–100.0)
Monocytes Absolute: 0.9 10*3/uL (ref 0.1–1.0)
Monocytes Relative: 7 %
NEUTROS ABS: 9.8 10*3/uL — AB (ref 1.7–7.7)
Neutrophils Relative %: 77 %
Platelets: 356 10*3/uL (ref 150–400)
RBC: 3.68 MIL/uL — ABNORMAL LOW (ref 3.87–5.11)
RDW: 13.9 % (ref 11.5–15.5)
WBC: 12.9 10*3/uL — ABNORMAL HIGH (ref 4.0–10.5)
nRBC: 0 % (ref 0.0–0.2)

## 2018-01-27 MED ORDER — HEPARIN SOD (PORK) LOCK FLUSH 100 UNIT/ML IV SOLN
500.0000 [IU] | Freq: Once | INTRAVENOUS | Status: AC | PRN
  Administered 2018-01-27: 500 [IU]
  Filled 2018-01-27: qty 5

## 2018-01-27 MED ORDER — DEXAMETHASONE SODIUM PHOSPHATE 10 MG/ML IJ SOLN
10.0000 mg | Freq: Once | INTRAMUSCULAR | Status: AC
  Administered 2018-01-27: 10 mg via INTRAVENOUS

## 2018-01-27 MED ORDER — DEXAMETHASONE SODIUM PHOSPHATE 10 MG/ML IJ SOLN
INTRAMUSCULAR | Status: AC
  Filled 2018-01-27: qty 1

## 2018-01-27 MED ORDER — SODIUM CHLORIDE 0.9% FLUSH
10.0000 mL | Freq: Once | INTRAVENOUS | Status: AC
  Administered 2018-01-27: 10 mL
  Filled 2018-01-27: qty 10

## 2018-01-27 MED ORDER — ONDANSETRON HCL 8 MG PO TABS
8.0000 mg | ORAL_TABLET | Freq: Once | ORAL | Status: AC
  Administered 2018-01-27: 8 mg via ORAL

## 2018-01-27 MED ORDER — SODIUM CHLORIDE 0.9 % IJ SOLN
10.0000 mL | INTRAMUSCULAR | Status: DC | PRN
  Administered 2018-01-27: 10 mL
  Filled 2018-01-27: qty 10

## 2018-01-27 MED ORDER — SODIUM CHLORIDE 0.9 % IV SOLN
Freq: Once | INTRAVENOUS | Status: AC
  Administered 2018-01-27: 11:00:00 via INTRAVENOUS
  Filled 2018-01-27: qty 250

## 2018-01-27 MED ORDER — ONDANSETRON HCL 8 MG PO TABS
ORAL_TABLET | ORAL | Status: AC
  Filled 2018-01-27: qty 1

## 2018-01-27 MED ORDER — SODIUM CHLORIDE 0.9 % IV SOLN
500.0000 mg/m2 | Freq: Once | INTRAVENOUS | Status: AC
  Administered 2018-01-27: 800 mg via INTRAVENOUS
  Filled 2018-01-27: qty 12

## 2018-01-27 NOTE — Progress Notes (Signed)
[place] Telephone:([phone]   Fax:([phone]  OFFICE PROGRESS NOTE  [name], MD [place] [address]  DIAGNOSIS: Metastatic non-small cell lung cancer initially diagnosed as locally advanced stage IIIB with a right Pancoast tumor involving the vertebral body as well as prominent canal invasion with spinal cord compression in October 2007. The patient also has metastatic disease to the brain in April 2008.  PRIOR THERAPY: 1. Status post concurrent chemoradiation with weekly carboplatin and paclitaxel, last dose was given November 18, 2005. 2. 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 3. Status post gamma knife radiotherapy to a solitary brain lesion located in the superior frontal area of the brain at [facility] in April of 2008. 4. Status post palliative radiotherapy to the lateral abdominal wall metastatic lesion under the care of Dr. Lisbeth Renshaw, completed March of 2009. 5. Status post 6 cycles of systemic chemotherapy with carboplatin and Alimta. Last dose was given July 26, 2007 with disease stabilization. 6. Gamma knife stereotactic radiotherapy to 2 brain lesions one involving the right frontal dural based as well as right parietal lesion performed on 05/07/2012 under the care of Dr. Vallarie Mare at [facility].  CURRENT THERAPY: Maintenance systemic chemotherapy with Alimta 500 MG/M2 every 3 weeks, status post 180 cycles.  INTERVAL HISTORY: Danielle [name] 68 y.o. female returns to the clinic today for follow-up visit accompanied by her daughter.  The patient is feeling fine today with no concerning complaints.  She denied having any chest pain, shortness of breath, cough or hemoptysis.  She has no fever or chills.  She denied having any nausea, vomiting, diarrhea or constipation.  She has no headache or visual changes.  She continues to tolerate  her treatment with maintenance Alimta fairly well.  She is here today for evaluation before starting cycle #181.  MEDICAL HISTORY: Past Medical History:  Diagnosis Date  . Anemia   . 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 CVA (cerebral vascular accident) ([place]) 04/2014   pt states had 2 cva's within 2 wks  . DJD (degenerative joint disease), cervical   . Fibromyalgia   . H/O: pneumonia   . History of tobacco abuse quit 10/08   on nicotine patch  . Hypertension   . Hypokalemia   . Lung cancer ([place]) dx'd 09/2005   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 had chemo and radiation for lung ca  . Thrush 12/11/2010    ALLERGIES:  is allergic to contrast media [iodinated diagnostic agents]; iohexol; sulfa drugs cross reactors; and co-trimoxazole injection [sulfamethoxazole-trimethoprim].  MEDICATIONS:  Current Outpatient Medications  Medication Sig Dispense Refill  . amLODipine (NORVASC) 10 MG tablet TAKE 1 TABLET (10 MG TOTAL) BY MOUTH DAILY. 90 tablet 1  . aspirin EC 325 MG tablet TAKE 1 TABLET BY MOUTH EVERY DAY 90 tablet 0  . atorvastatin (LIPITOR) 40 MG tablet TAKE 1 TABLET BY MOUTH EVERY DAY AT 6 PM 90 tablet 3  . diphenhydrAMINE (BENADRYL) 25 MG tablet Take 1 tablet (25 mg total) by mouth as directed. 2 hours before CT scan. 5 tablet 0  . FeFum-FePoly-FA-B Cmp-C-Biot (INTEGRA PLUS) CAPS Take 1 capsule by mouth every morning. 90 capsule 3  . fluticasone (FLONASE) 50 MCG/ACT nasal spray Place 1-2 sprays into both nostrils daily as needed. 16 g 5  . folic acid (FOLVITE) 1 MG tablet Take 1 tablet (1 mg total)  by mouth daily. 90 tablet 0  . furosemide (LASIX) 20 MG tablet TAKE 1 TABLET BY MOUTH EVERY DAY AS NEEDED FOR SWELLING 20 tablet 0  . lacosamide (VIMPAT) 50 MG TABS tablet Take by mouth.    . levofloxacin (LEVAQUIN) 500 MG tablet Take 1 tablet (500 mg total) by mouth daily. 7 tablet 0  . lidocaine-prilocaine (EMLA) cream APPLY TO PORT-A-CATH AS DIRECTED 1-2 HOURS  PRIOR TO CHEMOTHERAPY 30 g 1  . lisinopril (PRINIVIL,ZESTRIL) 20 MG tablet TAKE 2 TABLETS (40 MG TOTAL) BY MOUTH DAILY. 180 tablet 3  . morphine (MS CONTIN) 60 MG 12 hr tablet Take 1 tablet (60 mg total) by mouth 2 (two) times daily. 60 tablet 0  . morphine (MSIR) 30 MG tablet Take 1 tablet (30 mg total) by mouth every 4 (four) hours as needed (breakthrough pain). 60 tablet 0  . Olopatadine HCl 0.2 % SOLN INSTILL 1 DROP INTO AFFECTED EYE(S) EVERY DAY 2.5 mL 1  . ondansetron (ZOFRAN) 8 MG tablet TAKE 1 TABLET BY MOUTH EVERY 8 HOURS AS NEEDED FOR FOR NAUSEA OR VOMITING 30 tablet 0  . predniSONE (DELTASONE) 50 MG tablet 1 tablet by mouth 13 hours ,7 hours and 1 hour  before CT scan 3 tablet 3  . triamcinolone (KENALOG) 0.025 % ointment Apply 1 application topically 2 (two) times daily. 30 g 0   No current facility-administered medications for this visit.     SURGICAL HISTORY:  Past Surgical History:  Procedure Laterality Date  . CERVICAL LAMINECTOMY  1995  . KNEE SURGERY  1990   Left x 2  . 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 LOOP RECORDER IMPLANT N/A 04/19/2014   Procedure: LOOP RECORDER IMPLANT;  Surgeon: Thompson Grayer, MD;  Location: [facility] CATH LAB;  Service: Cardiovascular;  Laterality: N/A;  . OTHER SURGICAL HISTORY  2008   Gamma knife surgery to remove brain met   . PORTACATH PLACEMENT  -[name]   TIP IN CAVOATRIAL JUNCTION  . TEE WITHOUT CARDIOVERSION N/A 04/19/2014   Procedure: TRANSESOPHAGEAL ECHOCARDIOGRAM (TEE);  Surgeon: Larey Dresser, MD;  Location: [facility] ENDOSCOPY;  Service: Cardiovascular;  Laterality: N/A;    REVIEW OF SYSTEMS:  A comprehensive review of systems was negative.   PHYSICAL EXAMINATION: General appearance: alert, cooperative and no distress Head: Normocephalic, without obvious abnormality, atraumatic Neck: no adenopathy, no JVD, supple, symmetrical, trachea midline and thyroid not enlarged, symmetric, no  tenderness/mass/nodules Lymph nodes: Cervical, supraclavicular, and axillary nodes normal. Resp: clear to auscultation bilaterally Back: symmetric, no curvature. ROM normal. No CVA tenderness. Cardio: regular rate and rhythm, S1, S2 normal, no murmur, click, rub or gallop GI: soft, non-tender; bowel sounds normal; no masses,  no organomegaly Extremities: extremities normal, atraumatic, no cyanosis or edema  ECOG PERFORMANCE STATUS: 1 - Symptomatic but completely ambulatory  Blood pressure (!) 152/55, pulse 96, temperature 98.2 F (36.8 C), temperature source Oral, resp. rate 20, height '5\' 2"'  (1.575 m), weight 115 lb 3.2 oz (52.3 kg), SpO2 99 %.  LABORATORY DATA: Lab Results  Component Value Date   WBC 12.9 (H) 01/06/2018   HGB 11.3 (L) 01/06/2018   HCT 35.9 (L) 01/06/2018   MCV 98.6 01/06/2018   PLT 320 01/06/2018      Chemistry      Component Value Date/Time   NA 143 01/06/2018 0808   NA 142 12/24/2016 0816   K 4.4 01/06/2018 0808   K 4.4 12/24/2016 0816  CL 108 01/06/2018 0808   CL 106 06/30/2012 1004   CO2 26 01/06/2018 0808   CO2 28 12/24/2016 0816   BUN 12 01/06/2018 0808   BUN 15.8 12/24/2016 0816   CREATININE 0.85 01/06/2018 0808   CREATININE 0.87 07/22/2017 0826   CREATININE 1.1 12/24/2016 0816      Component Value Date/Time   CALCIUM 9.5 01/06/2018 0808   CALCIUM 9.6 12/24/2016 0816   ALKPHOS 82 01/06/2018 0808   ALKPHOS 80 12/24/2016 0816   AST 20 01/06/2018 0808   AST 20 07/22/2017 0826   AST 20 12/24/2016 0816   ALT 11 01/06/2018 0808   ALT 11 07/22/2017 0826   ALT 11 12/24/2016 0816   BILITOT 0.3 01/06/2018 0808   BILITOT 0.4 07/22/2017 0826   BILITOT 0.34 12/24/2016 0816       RADIOGRAPHIC STUDIES: No results found.  ASSESSMENT AND PLAN: This is a very pleasant 68 years old white female with metastatic non-small cell lung cancer diagnosed in October 2007 status post concurrent chemoradiation followed by consolidation chemotherapy and she is  currently on maintenance treatment with single agent Alimta status post 180 cycles. The patient has been tolerating this treatment well with no concerning adverse effects. I recommended for the patient to proceed with cycle #181 today as scheduled. I will see her back for follow-up visit in 3 weeks for evaluation before the next cycle of her treatment. We will arrange for the patient to have repeat CT scan of the chest, abdomen and pelvis after the next cycle of her treatment. The patient was advised to call immediately if she has any concerning symptoms in the interval. The patient voices understanding of current disease status and treatment options and is in agreement with the current care plan. All questions were answered. The patient knows to call the clinic with any problems, questions or concerns. We can certainly see the patient much sooner if necessary.  Disclaimer: This note was dictated with voice recognition software. Similar sounding words can inadvertently be transcribed and may not be corrected upon review.

## 2018-01-27 NOTE — Addendum Note (Signed)
Addended by: Margaret Pyle on: 01/27/2018 11:47 AM   Modules accepted: Orders

## 2018-01-27 NOTE — Patient Instructions (Signed)
[facility] Discharge Instructions for Patients Receiving Chemotherapy  Today you received the following chemotherapy agents alimta. To help prevent nausea and vomiting after your treatment, we encourage you to take your nausea medication as directed.    If you develop nausea and vomiting that is not controlled by your nausea medication, call the clinic.   BELOW ARE SYMPTOMS THAT SHOULD BE REPORTED IMMEDIATELY:  *FEVER GREATER THAN 100.5 F  *CHILLS WITH OR WITHOUT FEVER  NAUSEA AND VOMITING THAT IS NOT CONTROLLED WITH YOUR NAUSEA MEDICATION  *UNUSUAL SHORTNESS OF BREATH  *UNUSUAL BRUISING OR BLEEDING  TENDERNESS IN MOUTH AND THROAT WITH OR WITHOUT PRESENCE OF ULCERS  *URINARY PROBLEMS  *BOWEL PROBLEMS  UNUSUAL RASH Items with * indicate a potential emergency and should be followed up as soon as possible.  Feel free to call the clinic you have any questions or concerns. The clinic phone number is [phone].  Please show the [facility] at check-in to the Emergency Department and triage nurse.

## 2018-01-27 NOTE — Telephone Encounter (Signed)
Scheduled appt per 1/21 los - added additional cycles - pt to get an upated schedule next visit.

## 2018-01-31 ENCOUNTER — Other Ambulatory Visit: Payer: Self-pay | Admitting: Internal Medicine

## 2018-01-31 DIAGNOSIS — C7931 Secondary malignant neoplasm of brain: Secondary | ICD-10-CM

## 2018-02-02 DIAGNOSIS — C44311 Basal cell carcinoma of skin of nose: Secondary | ICD-10-CM | POA: Diagnosis not present

## 2018-02-13 ENCOUNTER — Telehealth: Payer: Self-pay | Admitting: *Deleted

## 2018-02-13 ENCOUNTER — Other Ambulatory Visit: Payer: Self-pay | Admitting: Oncology

## 2018-02-13 DIAGNOSIS — C3410 Malignant neoplasm of upper lobe, unspecified bronchus or lung: Secondary | ICD-10-CM

## 2018-02-13 DIAGNOSIS — C3490 Malignant neoplasm of unspecified part of unspecified bronchus or lung: Secondary | ICD-10-CM

## 2018-02-13 MED ORDER — MORPHINE SULFATE 30 MG PO TABS: 30.0000 mg | ORAL_TABLET | ORAL | 0 refills | Status: DC | PRN

## 2018-02-13 MED ORDER — MORPHINE SULFATE ER 60 MG PO TBCR: 60.0000 mg | EXTENDED_RELEASE_TABLET | Freq: Two times a day (BID) | ORAL | 0 refills | Status: DC

## 2018-02-13 NOTE — Telephone Encounter (Signed)
TC from patient requesting refill on her MS Contin and her MSIR.  Mikey Bussing, NP made aware.

## 2018-02-17 ENCOUNTER — Inpatient Hospital Stay: Payer: Medicare Other

## 2018-02-17 ENCOUNTER — Telehealth: Payer: Self-pay

## 2018-02-17 ENCOUNTER — Inpatient Hospital Stay: Payer: Medicare Other | Attending: Internal Medicine

## 2018-02-17 ENCOUNTER — Inpatient Hospital Stay (HOSPITAL_BASED_OUTPATIENT_CLINIC_OR_DEPARTMENT_OTHER): Payer: Medicare Other | Admitting: Internal Medicine

## 2018-02-17 ENCOUNTER — Encounter: Payer: Self-pay | Admitting: Internal Medicine

## 2018-02-17 VITALS — BP 135/59 | HR 85 | Temp 98.2°F | Resp 18 | Ht 62.0 in | Wt 119.8 lb

## 2018-02-17 DIAGNOSIS — Z5111 Encounter for antineoplastic chemotherapy: Secondary | ICD-10-CM | POA: Insufficient documentation

## 2018-02-17 DIAGNOSIS — C7931 Secondary malignant neoplasm of brain: Secondary | ICD-10-CM | POA: Insufficient documentation

## 2018-02-17 DIAGNOSIS — C3411 Malignant neoplasm of upper lobe, right bronchus or lung: Secondary | ICD-10-CM | POA: Insufficient documentation

## 2018-02-17 DIAGNOSIS — I10 Essential (primary) hypertension: Secondary | ICD-10-CM | POA: Insufficient documentation

## 2018-02-17 DIAGNOSIS — C3490 Malignant neoplasm of unspecified part of unspecified bronchus or lung: Secondary | ICD-10-CM

## 2018-02-17 LAB — CBC WITH DIFFERENTIAL/PLATELET
ABS IMMATURE GRANULOCYTES: 0.05 10*3/uL (ref 0.00–0.07)
BASOS ABS: 0.1 10*3/uL (ref 0.0–0.1)
Basophils Relative: 1 %
Eosinophils Absolute: 0.1 10*3/uL (ref 0.0–0.5)
Eosinophils Relative: 1 %
HCT: 35.4 % — ABNORMAL LOW (ref 36.0–46.0)
Hemoglobin: 11.5 g/dL — ABNORMAL LOW (ref 12.0–15.0)
Immature Granulocytes: 1 %
Lymphocytes Relative: 19 %
Lymphs Abs: 2.1 10*3/uL (ref 0.7–4.0)
MCH: 31.6 pg (ref 26.0–34.0)
MCHC: 32.5 g/dL (ref 30.0–36.0)
MCV: 97.3 fL (ref 80.0–100.0)
Monocytes Absolute: 1.1 10*3/uL — ABNORMAL HIGH (ref 0.1–1.0)
Monocytes Relative: 10 %
NEUTROS ABS: 7.8 10*3/uL — AB (ref 1.7–7.7)
NEUTROS PCT: 68 %
NRBC: 0 % (ref 0.0–0.2)
Platelets: 336 10*3/uL (ref 150–400)
RBC: 3.64 MIL/uL — ABNORMAL LOW (ref 3.87–5.11)
RDW: 14.5 % (ref 11.5–15.5)
WBC: 11.1 10*3/uL — ABNORMAL HIGH (ref 4.0–10.5)

## 2018-02-17 LAB — COMPREHENSIVE METABOLIC PANEL
ALT: 14 U/L (ref 0–44)
ANION GAP: 8 (ref 5–15)
AST: 24 U/L (ref 15–41)
Albumin: 3.4 g/dL — ABNORMAL LOW (ref 3.5–5.0)
Alkaline Phosphatase: 81 U/L (ref 38–126)
BUN: 15 mg/dL (ref 8–23)
CO2: 28 mmol/L (ref 22–32)
Calcium: 9.3 mg/dL (ref 8.9–10.3)
Chloride: 106 mmol/L (ref 98–111)
Creatinine, Ser: 1.01 mg/dL — ABNORMAL HIGH (ref 0.44–1.00)
GFR calc non Af Amer: 58 mL/min — ABNORMAL LOW (ref >60)
Glucose, Bld: 165 mg/dL — ABNORMAL HIGH (ref 70–99)
Potassium: 3.9 mmol/L (ref 3.5–5.1)
Sodium: 142 mmol/L (ref 135–145)
Total Bilirubin: 0.4 mg/dL (ref 0.3–1.2)
Total Protein: 6.5 g/dL (ref 6.5–8.1)

## 2018-02-17 MED ORDER — ONDANSETRON HCL 8 MG PO TABS
ORAL_TABLET | ORAL | Status: AC
  Filled 2018-02-17: qty 1

## 2018-02-17 MED ORDER — DEXAMETHASONE SODIUM PHOSPHATE 10 MG/ML IJ SOLN
INTRAMUSCULAR | Status: AC
  Filled 2018-02-17: qty 1

## 2018-02-17 MED ORDER — HEPARIN SOD (PORK) LOCK FLUSH 100 UNIT/ML IV SOLN
500.0000 [IU] | Freq: Once | INTRAVENOUS | Status: AC | PRN
  Administered 2018-02-17: 500 [IU]
  Filled 2018-02-17: qty 5

## 2018-02-17 MED ORDER — ONDANSETRON HCL 8 MG PO TABS
8.0000 mg | ORAL_TABLET | Freq: Once | ORAL | Status: AC
  Administered 2018-02-17: 8 mg via ORAL

## 2018-02-17 MED ORDER — SODIUM CHLORIDE 0.9 % IV SOLN
Freq: Once | INTRAVENOUS | Status: AC
  Administered 2018-02-17: 10:00:00 via INTRAVENOUS
  Filled 2018-02-17: qty 250

## 2018-02-17 MED ORDER — SODIUM CHLORIDE 0.9 % IJ SOLN
10.0000 mL | INTRAMUSCULAR | Status: DC | PRN
  Filled 2018-02-17: qty 10

## 2018-02-17 MED ORDER — SODIUM CHLORIDE 0.9 % IV SOLN
500.0000 mg/m2 | Freq: Once | INTRAVENOUS | Status: AC
  Administered 2018-02-17: 800 mg via INTRAVENOUS
  Filled 2018-02-17: qty 20

## 2018-02-17 MED ORDER — DEXAMETHASONE SODIUM PHOSPHATE 10 MG/ML IJ SOLN
10.0000 mg | Freq: Once | INTRAMUSCULAR | Status: AC
  Administered 2018-02-17: 10 mg via INTRAVENOUS

## 2018-02-17 NOTE — Telephone Encounter (Signed)
Printed avs and calender of upcoming appointment. Per 2/11  Los gave patient contast , instructions, and CT information

## 2018-02-17 NOTE — Patient Instructions (Signed)
[facility] Discharge Instructions for Patients Receiving Chemotherapy  Today you received the following chemotherapy agents alimta. To help prevent nausea and vomiting after your treatment, we encourage you to take your nausea medication as directed.    If you develop nausea and vomiting that is not controlled by your nausea medication, call the clinic.   BELOW ARE SYMPTOMS THAT SHOULD BE REPORTED IMMEDIATELY:  *FEVER GREATER THAN 100.5 F  *CHILLS WITH OR WITHOUT FEVER  NAUSEA AND VOMITING THAT IS NOT CONTROLLED WITH YOUR NAUSEA MEDICATION  *UNUSUAL SHORTNESS OF BREATH  *UNUSUAL BRUISING OR BLEEDING  TENDERNESS IN MOUTH AND THROAT WITH OR WITHOUT PRESENCE OF ULCERS  *URINARY PROBLEMS  *BOWEL PROBLEMS  UNUSUAL RASH Items with * indicate a potential emergency and should be followed up as soon as possible.  Feel free to call the clinic you have any questions or concerns. The clinic phone number is [phone].  Please show the [facility] at check-in to the Emergency Department and triage nurse.

## 2018-02-17 NOTE — Progress Notes (Signed)
[place] Telephone:([phone]   Fax:(336) [phone]  OFFICE PROGRESS NOTE  [name], MD [place] [address]  DIAGNOSIS: Metastatic non-small cell lung cancer initially diagnosed as locally advanced stage IIIB with a right Pancoast tumor involving the vertebral body as well as prominent canal invasion with spinal cord compression in October 2007. The patient also has metastatic disease to the brain in April 2008.  PRIOR THERAPY: 1. Status post concurrent chemoradiation with weekly carboplatin and paclitaxel, last dose was given November 18, 2005. 2. 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 3. Status post gamma knife radiotherapy to a solitary brain lesion located in the superior frontal area of the brain at [facility] in April of 2008. 4. Status post palliative radiotherapy to the lateral abdominal wall metastatic lesion under the care of Dr. Lisbeth Renshaw, completed March of 2009. 5. Status post 6 cycles of systemic chemotherapy with carboplatin and Alimta. Last dose was given July 26, 2007 with disease stabilization. 6. Gamma knife stereotactic radiotherapy to 2 brain lesions one involving the right frontal dural based as well as right parietal lesion performed on 05/07/2012 under the care of Dr. Vallarie Mare at [facility].  CURRENT THERAPY: Maintenance systemic chemotherapy with Alimta 500 MG/M2 every 3 weeks, status post 181 cycles.  INTERVAL HISTORY: Danielle [name] 69 y.o. female returns to the clinic today for follow-up visit accompanied by her daughter.  The patient is feeling fine today with no concerning complaints.  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  She denied having any current chest pain, shortness of breath, cough or hemoptysis.  She denied having any nausea, vomiting, diarrhea or constipation.  She has no  weight loss or night sweats.  She has no headache or visual changes.  She is here today for evaluation before starting cycle #182 of her maintenance treatment.    MEDICAL HISTORY: Past Medical History:  Diagnosis Date  . Anemia   . 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 CVA (cerebral vascular accident) ([place]) 04/2014   pt states had 2 cva's within 2 wks  . DJD (degenerative joint disease), cervical   . Fibromyalgia   . H/O: pneumonia   . History of tobacco abuse quit 10/08   on nicotine patch  . Hypertension   . Hypokalemia   . Lung cancer ([place]) dx'd 09/2005   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 had chemo and radiation for lung ca  . Thrush 12/11/2010    ALLERGIES:  is allergic to contrast media [iodinated diagnostic agents]; iohexol; sulfa drugs cross reactors; and co-trimoxazole injection [sulfamethoxazole-trimethoprim].  MEDICATIONS:  Current Outpatient Medications  Medication Sig Dispense Refill  . amLODipine (NORVASC) 10 MG tablet TAKE 1 TABLET (10 MG TOTAL) BY MOUTH DAILY. 90 tablet 1  . aspirin EC 325 MG tablet TAKE 1 TABLET BY MOUTH EVERY DAY 90 tablet 0  . atorvastatin (LIPITOR) 40 MG tablet TAKE 1 TABLET BY MOUTH EVERY DAY AT 6 PM 90 tablet 3  . diphenhydrAMINE (BENADRYL) 25 MG tablet Take 1 tablet (25 mg total) by mouth as directed. 2 hours before CT scan. (Patient not taking: Reported on 01/27/2018) 5 tablet 0  . FeFum-FePoly-FA-B Cmp-C-Biot (INTEGRA PLUS) CAPS Take 1 capsule by mouth every morning. 90 capsule 3  . fluticasone (FLONASE) 50 MCG/ACT nasal spray Place 1-2 sprays into both nostrils daily as  needed. 16 g 5  . folic acid (FOLVITE) 1 MG tablet Take 1 tablet (1 mg total) by mouth daily. 90 tablet 0  . furosemide (LASIX) 20 MG tablet TAKE 1 TABLET BY MOUTH EVERY DAY AS NEEDED FOR SWELLING 20 tablet 0  . lacosamide (VIMPAT) 50 MG TABS tablet Take by mouth.    . levofloxacin (LEVAQUIN) 500 MG tablet Take 1 tablet (500 mg total) by mouth daily.  7 tablet 0  . lidocaine-prilocaine (EMLA) cream APPLY TO PORT-A-CATH AS DIRECTED 1-2 HOURS PRIOR TO CHEMOTHERAPY 30 g 1  . lisinopril (PRINIVIL,ZESTRIL) 20 MG tablet TAKE 2 TABLETS (40 MG TOTAL) BY MOUTH DAILY. 180 tablet 3  . morphine (MS CONTIN) 60 MG 12 hr tablet Take 1 tablet (60 mg total) by mouth 2 (two) times daily. 60 tablet 0  . morphine (MSIR) 30 MG tablet Take 1 tablet (30 mg total) by mouth every 4 (four) hours as needed (breakthrough pain). 60 tablet 0  . Olopatadine HCl 0.2 % SOLN INSTILL 1 DROP INTO AFFECTED EYE(S) EVERY DAY 2.5 mL 1  . ondansetron (ZOFRAN) 8 MG tablet TAKE 1 TABLET BY MOUTH EVERY 8 HOURS AS NEEDED FOR FOR NAUSEA OR VOMITING 30 tablet 0  . predniSONE (DELTASONE) 50 MG tablet 1 tablet by mouth 13 hours ,7 hours and 1 hour  before CT scan 3 tablet 3  . triamcinolone (KENALOG) 0.025 % ointment Apply 1 application topically 2 (two) times daily. 30 g 0   No current facility-administered medications for this visit.     SURGICAL HISTORY:  Past Surgical History:  Procedure Laterality Date  . CERVICAL LAMINECTOMY  1995  . KNEE SURGERY  1990   Left x 2  . 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 LOOP RECORDER IMPLANT N/A 04/19/2014   Procedure: LOOP RECORDER IMPLANT;  Surgeon: Thompson Grayer, MD;  Location: [facility] CATH LAB;  Service: Cardiovascular;  Laterality: N/A;  . OTHER SURGICAL HISTORY  2008   Gamma knife surgery to remove brain met   . PORTACATH PLACEMENT  -[name]   TIP IN CAVOATRIAL JUNCTION  . TEE WITHOUT CARDIOVERSION N/A 04/19/2014   Procedure: TRANSESOPHAGEAL ECHOCARDIOGRAM (TEE);  Surgeon: Larey Dresser, MD;  Location: [facility] ENDOSCOPY;  Service: Cardiovascular;  Laterality: N/A;    REVIEW OF SYSTEMS:  A comprehensive review of systems was negative.   PHYSICAL EXAMINATION: General appearance: alert, cooperative and no distress Head: Normocephalic, without obvious abnormality, atraumatic Neck: no adenopathy, no JVD, supple,  symmetrical, trachea midline and thyroid not enlarged, symmetric, no tenderness/mass/nodules Lymph nodes: Cervical, supraclavicular, and axillary nodes normal. Resp: clear to auscultation bilaterally Back: symmetric, no curvature. ROM normal. No CVA tenderness. Cardio: regular rate and rhythm, S1, S2 normal, no murmur, click, rub or gallop GI: soft, non-tender; bowel sounds normal; no masses,  no organomegaly Extremities: extremities normal, atraumatic, no cyanosis or edema  ECOG PERFORMANCE STATUS: 1 - Symptomatic but completely ambulatory  Blood pressure (!) 135/59, pulse 85, temperature 98.2 F (36.8 C), temperature source Oral, resp. rate 18, height '5\' 2"'  (1.575 m), weight 119 lb 12.8 oz (54.3 kg), SpO2 97 %.  LABORATORY DATA: Lab Results  Component Value Date   WBC 11.1 (H) 02/17/2018   HGB 11.5 (L) 02/17/2018   HCT 35.4 (L) 02/17/2018   MCV 97.3 02/17/2018   PLT 336 02/17/2018      Chemistry      Component Value Date/Time   NA 140 01/27/2018 0814  NA 142 12/24/2016 0816   K 4.1 01/27/2018 0814   K 4.4 12/24/2016 0816   CL 106 01/27/2018 0814   CL 106 06/30/2012 1004   CO2 25 01/27/2018 0814   CO2 28 12/24/2016 0816   BUN 15 01/27/2018 0814   BUN 15.8 12/24/2016 0816   CREATININE 0.90 01/27/2018 0814   CREATININE 0.87 07/22/2017 0826   CREATININE 1.1 12/24/2016 0816      Component Value Date/Time   CALCIUM 9.6 01/27/2018 0814   CALCIUM 9.6 12/24/2016 0816   ALKPHOS 77 01/27/2018 0814   ALKPHOS 80 12/24/2016 0816   AST 25 01/27/2018 0814   AST 20 07/22/2017 0826   AST 20 12/24/2016 0816   ALT 16 01/27/2018 0814   ALT 11 07/22/2017 0826   ALT 11 12/24/2016 0816   BILITOT 0.4 01/27/2018 0814   BILITOT 0.4 07/22/2017 0826   BILITOT 0.34 12/24/2016 0816       RADIOGRAPHIC STUDIES: No results found.  ASSESSMENT AND PLAN: This is a very pleasant 68 years old white female with metastatic non-small cell lung cancer diagnosed in October 2007 status post  concurrent chemoradiation followed by consolidation chemotherapy and she is currently on maintenance treatment with single agent Alimta status post 181 cycles. The patient continues to tolerate her treatment well with no concerning complaints. I recommended for her to proceed with cycle #182 today as scheduled. I will see her back for follow-up visit in 3 weeks for evaluation with repeat CT scan of the chest, abdomen and pelvis for restaging of her disease before starting cycle #183. She was advised to call immediately if she has any concerning symptoms in the interval. The patient voices understanding of current disease status and treatment options and is in agreement with the current care plan. All questions were answered. The patient knows to call the clinic with any problems, questions or concerns. We can certainly see the patient much sooner if necessary.  Disclaimer: This note was dictated with voice recognition software. Similar sounding words can inadvertently be transcribed and may not be corrected upon review.

## 2018-02-23 ENCOUNTER — Other Ambulatory Visit: Payer: Self-pay | Admitting: Internal Medicine

## 2018-02-23 DIAGNOSIS — C7931 Secondary malignant neoplasm of brain: Secondary | ICD-10-CM

## 2018-02-24 ENCOUNTER — Other Ambulatory Visit: Payer: Self-pay | Admitting: Medical Oncology

## 2018-02-24 DIAGNOSIS — Z91041 Radiographic dye allergy status: Secondary | ICD-10-CM

## 2018-02-24 MED ORDER — PREDNISONE 50 MG PO TABS: ORAL_TABLET | ORAL | 4 refills | Status: DC

## 2018-02-25 DIAGNOSIS — C44311 Basal cell carcinoma of skin of nose: Secondary | ICD-10-CM | POA: Diagnosis not present

## 2018-03-03 ENCOUNTER — Other Ambulatory Visit: Payer: Self-pay | Admitting: Internal Medicine

## 2018-03-04 ENCOUNTER — Other Ambulatory Visit: Payer: Self-pay | Admitting: Internal Medicine

## 2018-03-06 ENCOUNTER — Other Ambulatory Visit: Payer: Self-pay | Admitting: Family Medicine

## 2018-03-09 ENCOUNTER — Ambulatory Visit (HOSPITAL_COMMUNITY)
Admission: RE | Admit: 2018-03-09 | Discharge: 2018-03-09 | Disposition: A | Discharge status: Home / Self Care | Payer: Medicare Other | Source: Ambulatory Visit | Attending: Internal Medicine | Admitting: Internal Medicine

## 2018-03-09 DIAGNOSIS — C3490 Malignant neoplasm of unspecified part of unspecified bronchus or lung: Secondary | ICD-10-CM | POA: Insufficient documentation

## 2018-03-09 DIAGNOSIS — C3491 Malignant neoplasm of unspecified part of right bronchus or lung: Secondary | ICD-10-CM | POA: Diagnosis not present

## 2018-03-09 MED ORDER — HEPARIN SOD (PORK) LOCK FLUSH 100 UNIT/ML IV SOLN
INTRAVENOUS | Status: AC
  Administered 2018-03-09: 500 [IU] via INTRAVENOUS
  Filled 2018-03-09: qty 5

## 2018-03-09 MED ORDER — SODIUM CHLORIDE (PF) 0.9 % IJ SOLN
INTRAMUSCULAR | Status: AC
  Filled 2018-03-09: qty 50

## 2018-03-09 MED ORDER — IOHEXOL 300 MG/ML  SOLN
75.0000 mL | Freq: Once | INTRAMUSCULAR | Status: AC | PRN
  Administered 2018-03-09: 75 mL via INTRAVENOUS

## 2018-03-09 MED ORDER — HEPARIN SOD (PORK) LOCK FLUSH 100 UNIT/ML IV SOLN: 500.0000 [IU] | Freq: Once | INTRAVENOUS | Status: DC

## 2018-03-10 ENCOUNTER — Inpatient Hospital Stay: Payer: Medicare Other

## 2018-03-10 ENCOUNTER — Inpatient Hospital Stay: Payer: Medicare Other | Attending: Internal Medicine

## 2018-03-10 ENCOUNTER — Encounter: Payer: Self-pay | Admitting: Family Medicine

## 2018-03-10 ENCOUNTER — Inpatient Hospital Stay (HOSPITAL_BASED_OUTPATIENT_CLINIC_OR_DEPARTMENT_OTHER): Payer: Medicare Other | Admitting: Internal Medicine

## 2018-03-10 ENCOUNTER — Encounter: Payer: Self-pay | Admitting: Internal Medicine

## 2018-03-10 ENCOUNTER — Telehealth: Payer: Self-pay | Admitting: Internal Medicine

## 2018-03-10 VITALS — BP 141/53 | HR 80 | Temp 97.7°F | Resp 20 | Ht 62.0 in | Wt 120.6 lb

## 2018-03-10 DIAGNOSIS — G893 Neoplasm related pain (acute) (chronic): Secondary | ICD-10-CM

## 2018-03-10 DIAGNOSIS — C3411 Malignant neoplasm of upper lobe, right bronchus or lung: Secondary | ICD-10-CM

## 2018-03-10 DIAGNOSIS — Z95828 Presence of other vascular implants and grafts: Secondary | ICD-10-CM

## 2018-03-10 DIAGNOSIS — C3490 Malignant neoplasm of unspecified part of unspecified bronchus or lung: Secondary | ICD-10-CM

## 2018-03-10 DIAGNOSIS — I10 Essential (primary) hypertension: Secondary | ICD-10-CM

## 2018-03-10 DIAGNOSIS — I2510 Atherosclerotic heart disease of native coronary artery without angina pectoris: Secondary | ICD-10-CM | POA: Insufficient documentation

## 2018-03-10 DIAGNOSIS — C7931 Secondary malignant neoplasm of brain: Secondary | ICD-10-CM

## 2018-03-10 DIAGNOSIS — Z5111 Encounter for antineoplastic chemotherapy: Secondary | ICD-10-CM

## 2018-03-10 LAB — COMPREHENSIVE METABOLIC PANEL
ALBUMIN: 3.2 g/dL — AB (ref 3.5–5.0)
ALT: 14 U/L (ref 0–44)
AST: 23 U/L (ref 15–41)
Alkaline Phosphatase: 76 U/L (ref 38–126)
Anion gap: 8 (ref 5–15)
BUN: 16 mg/dL (ref 8–23)
CO2: 26 mmol/L (ref 22–32)
CREATININE: 0.82 mg/dL (ref 0.44–1.00)
Calcium: 9 mg/dL (ref 8.9–10.3)
Chloride: 107 mmol/L (ref 98–111)
GFR calc Af Amer: >60 mL/min (ref >60)
GFR calc non Af Amer: >60 mL/min (ref >60)
Glucose, Bld: 103 mg/dL — ABNORMAL HIGH (ref 70–99)
Potassium: 4 mmol/L (ref 3.5–5.1)
Sodium: 141 mmol/L (ref 135–145)
Total Bilirubin: 0.3 mg/dL (ref 0.3–1.2)
Total Protein: 6.5 g/dL (ref 6.5–8.1)

## 2018-03-10 LAB — CBC WITH DIFFERENTIAL/PLATELET
Abs Immature Granulocytes: 0.07 10*3/uL (ref 0.00–0.07)
Basophils Absolute: 0 10*3/uL (ref 0.0–0.1)
Basophils Relative: 0 %
EOS PCT: 1 %
Eosinophils Absolute: 0.1 10*3/uL (ref 0.0–0.5)
HCT: 34.2 % — ABNORMAL LOW (ref 36.0–46.0)
Hemoglobin: 11 g/dL — ABNORMAL LOW (ref 12.0–15.0)
Immature Granulocytes: 1 %
Lymphocytes Relative: 17 %
Lymphs Abs: 2 10*3/uL (ref 0.7–4.0)
MCH: 31.8 pg (ref 26.0–34.0)
MCHC: 32.2 g/dL (ref 30.0–36.0)
MCV: 98.8 fL (ref 80.0–100.0)
Monocytes Absolute: 1.2 10*3/uL — ABNORMAL HIGH (ref 0.1–1.0)
Monocytes Relative: 10 %
Neutro Abs: 8.2 10*3/uL — ABNORMAL HIGH (ref 1.7–7.7)
Neutrophils Relative %: 71 %
Platelets: 331 10*3/uL (ref 150–400)
RBC: 3.46 MIL/uL — ABNORMAL LOW (ref 3.87–5.11)
RDW: 14.6 % (ref 11.5–15.5)
WBC: 11.5 10*3/uL — ABNORMAL HIGH (ref 4.0–10.5)
nRBC: 0 % (ref 0.0–0.2)

## 2018-03-10 MED ORDER — DEXAMETHASONE SODIUM PHOSPHATE 10 MG/ML IJ SOLN
INTRAMUSCULAR | Status: AC
  Filled 2018-03-10: qty 1

## 2018-03-10 MED ORDER — ONDANSETRON HCL 8 MG PO TABS
8.0000 mg | ORAL_TABLET | Freq: Once | ORAL | Status: AC
  Administered 2018-03-10: 8 mg via ORAL

## 2018-03-10 MED ORDER — ONDANSETRON HCL 8 MG PO TABS
ORAL_TABLET | ORAL | Status: AC
  Filled 2018-03-10: qty 1

## 2018-03-10 MED ORDER — HEPARIN SOD (PORK) LOCK FLUSH 100 UNIT/ML IV SOLN
500.0000 [IU] | Freq: Once | INTRAVENOUS | Status: AC | PRN
  Administered 2018-03-10: 500 [IU]
  Filled 2018-03-10: qty 5

## 2018-03-10 MED ORDER — SODIUM CHLORIDE 0.9% FLUSH
10.0000 mL | INTRAVENOUS | Status: DC | PRN
  Administered 2018-03-10: 10 mL via INTRAVENOUS
  Filled 2018-03-10: qty 10

## 2018-03-10 MED ORDER — DEXAMETHASONE SODIUM PHOSPHATE 10 MG/ML IJ SOLN
10.0000 mg | Freq: Once | INTRAMUSCULAR | Status: AC
  Administered 2018-03-10: 10 mg via INTRAVENOUS

## 2018-03-10 MED ORDER — SODIUM CHLORIDE 0.9 % IV SOLN
500.0000 mg/m2 | Freq: Once | INTRAVENOUS | Status: AC
  Administered 2018-03-10: 800 mg via INTRAVENOUS
  Filled 2018-03-10: qty 20

## 2018-03-10 MED ORDER — SODIUM CHLORIDE 0.9% FLUSH
10.0000 mL | INTRAVENOUS | Status: DC | PRN
  Administered 2018-03-10: 10 mL
  Filled 2018-03-10: qty 10

## 2018-03-10 MED ORDER — CYANOCOBALAMIN 1000 MCG/ML IJ SOLN
INTRAMUSCULAR | Status: AC
  Filled 2018-03-10: qty 1

## 2018-03-10 MED ORDER — SODIUM CHLORIDE 0.9 % IV SOLN
Freq: Once | INTRAVENOUS | Status: AC
  Administered 2018-03-10: 10:00:00 via INTRAVENOUS
  Filled 2018-03-10: qty 250

## 2018-03-10 MED ORDER — CYANOCOBALAMIN 1000 MCG/ML IJ SOLN
1000.0000 ug | Freq: Once | INTRAMUSCULAR | Status: AC
  Administered 2018-03-10: 1000 ug via INTRAMUSCULAR

## 2018-03-10 NOTE — Patient Instructions (Signed)
[facility] Discharge Instructions for Patients Receiving Chemotherapy  Today you received the following chemotherapy agents alimta. To help prevent nausea and vomiting after your treatment, we encourage you to take your nausea medication as directed.    If you develop nausea and vomiting that is not controlled by your nausea medication, call the clinic.   BELOW ARE SYMPTOMS THAT SHOULD BE REPORTED IMMEDIATELY:  *FEVER GREATER THAN 100.5 F  *CHILLS WITH OR WITHOUT FEVER  NAUSEA AND VOMITING THAT IS NOT CONTROLLED WITH YOUR NAUSEA MEDICATION  *UNUSUAL SHORTNESS OF BREATH  *UNUSUAL BRUISING OR BLEEDING  TENDERNESS IN MOUTH AND THROAT WITH OR WITHOUT PRESENCE OF ULCERS  *URINARY PROBLEMS  *BOWEL PROBLEMS  UNUSUAL RASH Items with * indicate a potential emergency and should be followed up as soon as possible.  Feel free to call the clinic you have any questions or concerns. The clinic phone number is [phone].  Please show the [facility] at check-in to the Emergency Department and triage nurse.

## 2018-03-10 NOTE — Telephone Encounter (Signed)
Scheduled appt per 3/3 los - pt to get an updated schedule next visit.

## 2018-03-10 NOTE — Addendum Note (Signed)
Addended by: Ardeen Garland on: 03/10/2018 12:52 PM   Modules accepted: Orders

## 2018-03-10 NOTE — Progress Notes (Signed)
[place] [phone]   [phone]  OFFICE PROGRESS NOTE  [name], MD [place] [address]  DIAGNOSIS: Metastatic non-small cell lung cancer initially diagnosed as locally advanced stage IIIB with a right Pancoast tumor involving the vertebral body as well as prominent canal invasion with spinal cord compression in October 2007. The patient also has metastatic disease to the brain in April 2008.  PRIOR THERAPY: 1. Status post concurrent chemoradiation with weekly carboplatin and paclitaxel, last dose was given November 18, 2005. 2. 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 3. Status post gamma knife radiotherapy to a solitary brain lesion located in the superior frontal area of the brain at [facility] in April of 2008. 4. Status post palliative radiotherapy to the lateral abdominal wall metastatic lesion under the care of Dr. Lisbeth Renshaw, completed March of 2009. 5. Status post 6 cycles of systemic chemotherapy with carboplatin and Alimta. Last dose was given July 26, 2007 with disease stabilization. 6. Gamma knife stereotactic radiotherapy to 2 brain lesions one involving the right frontal dural based as well as right parietal lesion performed on 05/07/2012 under the care of Dr. Vallarie Mare at [facility].  CURRENT THERAPY: Maintenance systemic chemotherapy with Alimta 500 MG/M2 every 3 weeks, status post 182 cycles.  INTERVAL HISTORY: Danielle [name] 68 y.o. female returns to the clinic today for follow-up visit accompanied by her daughter.  The patient is feeling fine today with no concerning complaints.  She is still recovering from the recent skin surgery.  She denied having any chest pain, shortness of breath, cough or hemoptysis.  She denied having any fever or chills.  She has no nausea, vomiting, diarrhea or constipation.  She has no  headache or visual changes.  She continues to tolerate her treatment with maintenance Alimta fairly well.  The patient had repeat CT scan of the chest, abdomen and pelvis performed recently and she is here for evaluation and discussion of her scan results.  MEDICAL HISTORY: Past Medical History:  Diagnosis Date  . Anemia   . 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 CVA (cerebral vascular accident) ([place]) 04/2014   pt states had 2 cva's within 2 wks  . DJD (degenerative joint disease), cervical   . Fibromyalgia   . H/O: pneumonia   . History of tobacco abuse quit 10/08   on nicotine patch  . Hypertension   . Hypokalemia   . Lung cancer ([place]) dx'd 09/2005   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 had chemo and radiation for lung ca  . Thrush 12/11/2010    ALLERGIES:  is allergic to contrast media [iodinated diagnostic agents]; iohexol; sulfa drugs cross reactors; and co-trimoxazole injection [sulfamethoxazole-trimethoprim].  MEDICATIONS:  Current Outpatient Medications  Medication Sig Dispense Refill  . amLODipine (NORVASC) 10 MG tablet TAKE 1 TABLET (10 MG TOTAL) BY MOUTH DAILY. 90 tablet 1  . aspirin EC 325 MG tablet TAKE 1 TABLET BY MOUTH EVERY DAY 90 tablet 0  . atorvastatin (LIPITOR) 40 MG tablet TAKE 1 TABLET BY MOUTH EVERY DAY AT 6 PM 90 tablet 1  . diphenhydrAMINE (BENADRYL) 25 MG tablet Take 1 tablet (25 mg total) by mouth as directed. 2 hours before CT scan. (Patient not taking: Reported on 01/27/2018) 5 tablet 0  . FeFum-FePoly-FA-B Cmp-C-Biot (INTEGRA PLUS) CAPS Take 1 capsule by mouth every morning. 90 capsule 3  .  fluticasone (FLONASE) 50 MCG/ACT nasal spray Place 1-2 sprays into both nostrils daily as needed. 16 g 5  . folic acid (FOLVITE) 1 MG tablet Take 1 tablet (1 mg total) by mouth daily. 90 tablet 0  . furosemide (LASIX) 20 MG tablet TAKE 1 TABLET BY MOUTH EVERY DAY AS NEEDED FOR SWELLING 20 tablet 0  . lacosamide (VIMPAT) 50 MG TABS tablet Take by  mouth.    . levofloxacin (LEVAQUIN) 500 MG tablet Take 1 tablet (500 mg total) by mouth daily. 7 tablet 0  . lidocaine-prilocaine (EMLA) cream APPLY TO PORT-A-CATH AS DIRECTED 1-2 HOURS PRIOR TO CHEMOTHERAPY 30 g 1  . lisinopril (PRINIVIL,ZESTRIL) 20 MG tablet TAKE 2 TABLETS (40 MG TOTAL) BY MOUTH DAILY. 180 tablet 3  . morphine (MS CONTIN) 60 MG 12 hr tablet Take 1 tablet (60 mg total) by mouth 2 (two) times daily. 60 tablet 0  . morphine (MSIR) 30 MG tablet Take 1 tablet (30 mg total) by mouth every 4 (four) hours as needed (breakthrough pain). 60 tablet 0  . Olopatadine HCl 0.2 % SOLN INSTILL 1 DROP INTO AFFECTED EYE(S) EVERY DAY 2.5 mL 1  . ondansetron (ZOFRAN) 8 MG tablet TAKE 1 TABLET BY MOUTH EVERY 8 HOURS AS NEEDED FOR FOR NAUSEA OR VOMITING 30 tablet 0  . predniSONE (DELTASONE) 50 MG tablet 1 tablet by mouth 13 hours ,7 hours and 1 hour  before CT scan 3 tablet 4  . triamcinolone (KENALOG) 0.025 % ointment Apply 1 application topically 2 (two) times daily. 30 g 0   No current facility-administered medications for this visit.     SURGICAL HISTORY:  Past Surgical History:  Procedure Laterality Date  . CERVICAL LAMINECTOMY  1995  . KNEE SURGERY  1990   Left x 2  . 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 LOOP RECORDER IMPLANT N/A 04/19/2014   Procedure: LOOP RECORDER IMPLANT;  Surgeon: Thompson Grayer, MD;  Location: [facility] CATH LAB;  Service: Cardiovascular;  Laterality: N/A;  . OTHER SURGICAL HISTORY  2008   Gamma knife surgery to remove brain met   . PORTACATH PLACEMENT  -[name]   TIP IN CAVOATRIAL JUNCTION  . TEE WITHOUT CARDIOVERSION N/A 04/19/2014   Procedure: TRANSESOPHAGEAL ECHOCARDIOGRAM (TEE);  Surgeon: Larey Dresser, MD;  Location: [facility];  Service: Cardiovascular;  Laterality: N/A;    REVIEW OF SYSTEMS:  Constitutional: negative Eyes: negative Ears, nose, mouth, throat, and face: negative Respiratory: positive for cough Cardiovascular:  negative Gastrointestinal: negative Genitourinary:negative Integument/breast: negative Hematologic/lymphatic: negative Musculoskeletal:negative Neurological: negative Behavioral/Psych: negative Endocrine: negative Allergic/Immunologic: negative   PHYSICAL EXAMINATION: General appearance: alert, cooperative and no distress Head: Normocephalic, without obvious abnormality, atraumatic Neck: no adenopathy, no JVD, supple, symmetrical, trachea midline and thyroid not enlarged, symmetric, no tenderness/mass/nodules Lymph nodes: Cervical, supraclavicular, and axillary nodes normal. Resp: clear to auscultation bilaterally Back: symmetric, no curvature. ROM normal. No CVA tenderness. Cardio: regular rate and rhythm, S1, S2 normal, no murmur, click, rub or gallop GI: soft, non-tender; bowel sounds normal; no masses,  no organomegaly Extremities: extremities normal, atraumatic, no cyanosis or edema Neurologic: Alert and oriented X 3, normal strength and tone. Normal symmetric reflexes. Normal coordination and gait  ECOG PERFORMANCE STATUS: 1 - Symptomatic but completely ambulatory  Blood pressure (!) 141/53, pulse 80, temperature 97.7 F (36.5 C), temperature source Oral, resp. rate 20, height _0  (1.575 m), weight 120 lb 9.6 oz (54.7 kg), SpO2 98 %.  LABORATORY  DATA: Lab Results  Component Value Date   WBC 11.5 (H) 03/10/2018   HGB 11.0 (L) 03/10/2018   HCT 34.2 (L) 03/10/2018   MCV 98.8 03/10/2018   PLT 331 03/10/2018      Chemistry      Component Value Date/Time   NA 142 02/17/2018 0855   NA 142 12/24/2016 0816   K 3.9 02/17/2018 0855   K 4.4 12/24/2016 0816   CL 106 02/17/2018 0855   CL 106 06/30/2012 1004   CO2 28 02/17/2018 0855   CO2 28 12/24/2016 0816   BUN 15 02/17/2018 0855   BUN 15.8 12/24/2016 0816   CREATININE 1.01 (H) 02/17/2018 0855   CREATININE 0.87 07/22/2017 0826   CREATININE 1.1 12/24/2016 0816      Component Value Date/Time   CALCIUM 9.3 02/17/2018  0855   CALCIUM 9.6 12/24/2016 0816   ALKPHOS 81 02/17/2018 0855   ALKPHOS 80 12/24/2016 0816   AST 24 02/17/2018 0855   AST 20 07/22/2017 0826   AST 20 12/24/2016 0816   ALT 14 02/17/2018 0855   ALT 11 07/22/2017 0826   ALT 11 12/24/2016 0816   BILITOT 0.4 02/17/2018 0855   BILITOT 0.4 07/22/2017 0826   BILITOT 0.34 12/24/2016 0816       RADIOGRAPHIC STUDIES: Ct Chest W Contrast  Result Date: 03/09/2018 CLINICAL DATA:  History of lung cancer diagnosed 2007 with metastatic disease to spine, brain and omentum 2014. Ongoing chemotherapy. EXAM: CT CHEST, ABDOMEN, AND PELVIS WITHOUT AND WITH CONTRAST TECHNIQUE: Multidetector CT imaging of the chest, abdomen and pelvis was performed following the standard protocol before and during bolus administration of intravenous contrast. Patient with contrast allergy as underwent 13 hour premedication regimen. CONTRAST:  84m OMNIPAQUE IOHEXOL 300 MG/ML  SOLN COMPARISON:  09/18/2017, 10/03/2009, 10/14/2005 FINDINGS: CT CHEST FINDINGS Cardiovascular: Heart is normal size. There is calcified plaque over the left main, left anterior descending and lateral circumflex coronary arteries. Mild calcified plaque over the thoracic aorta. Thoracic aorta is otherwise normal in caliber. Pulmonary arterial system is unremarkable. Mediastinum/Nodes: No mediastinal or hilar adenopathy. Remaining mediastinal structures are normal. Lungs/Pleura: Right IJ Port-A-Cath has tip over the cavoatrial junction. Mild to moderate centrilobular emphysematous disease. Biapical pleural thickening right greater than left. Posteromedial right apical focal pleural thickening measuring approximately 1.3 x 2 cm with single small associated calcification unchanged as this is the area of patient's original lung cancer. Minimal focal pleural based nodularity over the anterior right upper lobe unchanged. Right paramediastinal/medial lung fibrosis likely post radiation change. Subtle focal stable  scarring over the lateral right upper lobe. Remainder of the lungs are clear. There is no focal consolidation. Tiny amount right pleural fluid unchanged. Airways are unremarkable. Musculoskeletal: Mild degenerative change of the spine. Stable severe compression fracture of T3. Prior T4 and T7 kyphoplasties. CT ABDOMEN PELVIS FINDINGS Hepatobiliary: Liver, gallbladder and biliary tree are within normal. Pancreas: Normal. Spleen: Normal. Adrenals/Urinary Tract: Adrenal glands are normal. Kidneys normal in size without hydronephrosis or nephrolithiasis. Subcentimeter upper pole right renal cortical hypodensity too small to characterize but likely a cyst and unchanged. Ureters and bladder are normal. Stomach/Bowel: Stomach and small bowel are unremarkable. Appendix is within normal. Colon is unremarkable. Vascular/Lymphatic: Moderate calcified plaque over the abdominal aorta and iliac arteries. No adenopathy. Reproductive: Normal. Other: No free fluid or focal inflammatory change. Musculoskeletal: Mild degenerate change of the spine. Mild sclerosis along the right side of the L1 vertebral body unchanged and may be due to metastatic disease.  Subtle patchy lucency over the lumbosacral spine unchanged and could be due to metastatic disease. 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 IMPRESSION: 1. Known stable pleural-based lung cancer over the posteromedial right apex measuring 1.3 x 2 cm. Post radiation fibrosis over the medial right lung with biapical pleural thickening. Stable small amount right pleural fluid. No adenopathy. No evidence of metastatic disease within the abdomen/pelvis. Mild L1 sclerosis with minimal patchy lucency over the lumbosacral spine unchanged as findings could be due to metastatic disease. Stable severe T3 compression fracture and kyphoplasty changes at T4-T7. 2. Subcentimeter right renal cortical hypodensity too small to characterize but likely a cyst and unchanged. 3. Aortic  Atherosclerosis (ICD10-I70.0) and Emphysema (ICD10-J43.9). 4.  Atherosclerotic coronary artery disease. 5.  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 Electronically Signed   By: Marin Olp M.D.   On: 03/09/2018 09:00   Ct Abdomen Pelvis W Contrast  Result Date: 03/09/2018 CLINICAL DATA:  History of lung cancer diagnosed 2007 with metastatic disease to spine, brain and omentum 2014. Ongoing chemotherapy. EXAM: CT CHEST, ABDOMEN, AND PELVIS WITHOUT AND WITH CONTRAST TECHNIQUE: Multidetector CT imaging of the chest, abdomen and pelvis was performed following the standard protocol before and during bolus administration of intravenous contrast. Patient with contrast allergy as underwent 13 hour premedication regimen. CONTRAST:  47m OMNIPAQUE IOHEXOL 300 MG/ML  SOLN COMPARISON:  09/18/2017, 10/03/2009, 10/14/2005 FINDINGS: CT CHEST FINDINGS Cardiovascular: Heart is normal size. There is calcified plaque over the left main, left anterior descending and lateral circumflex coronary arteries. Mild calcified plaque over the thoracic aorta. Thoracic aorta is otherwise normal in caliber. Pulmonary arterial system is unremarkable. Mediastinum/Nodes: No mediastinal or hilar adenopathy. Remaining mediastinal structures are normal. Lungs/Pleura: Right IJ Port-A-Cath has tip over the cavoatrial junction. Mild to moderate centrilobular emphysematous disease. Biapical pleural thickening right greater than left. Posteromedial right apical focal pleural thickening measuring approximately 1.3 x 2 cm with single small associated calcification unchanged as this is the area of patient's original lung cancer. Minimal focal pleural based nodularity over the anterior right upper lobe unchanged. Right paramediastinal/medial lung fibrosis likely post radiation change. Subtle focal stable scarring over the lateral right upper lobe. Remainder of the lungs are clear. There is no focal consolidation. Tiny amount right  pleural fluid unchanged. Airways are unremarkable. Musculoskeletal: Mild degenerative change of the spine. Stable severe compression fracture of T3. Prior T4 and T7 kyphoplasties. CT ABDOMEN PELVIS FINDINGS Hepatobiliary: Liver, gallbladder and biliary tree are within normal. Pancreas: Normal. Spleen: Normal. Adrenals/Urinary Tract: Adrenal glands are normal. Kidneys normal in size without hydronephrosis or nephrolithiasis. Subcentimeter upper pole right renal cortical hypodensity too small to characterize but likely a cyst and unchanged. Ureters and bladder are normal. Stomach/Bowel: Stomach and small bowel are unremarkable. Appendix is within normal. Colon is unremarkable. Vascular/Lymphatic: Moderate calcified plaque over the abdominal aorta and iliac arteries. No adenopathy. Reproductive: Normal. Other: No free fluid or focal inflammatory change. Musculoskeletal: Mild degenerate change of the spine. Mild sclerosis along the right side of the L1 vertebral body unchanged and may be due to metastatic disease. Subtle patchy lucency over the lumbosacral spine unchanged and could be due to metastatic disease. 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 IMPRESSION: 1. Known stable pleural-based lung cancer over the posteromedial right apex measuring 1.3 x 2 cm. Post radiation fibrosis over the medial right lung with biapical pleural thickening. Stable small amount right pleural fluid. No adenopathy. No evidence of metastatic disease within the abdomen/pelvis. Mild  L1 sclerosis with minimal patchy lucency over the lumbosacral spine unchanged as findings could be due to metastatic disease. Stable severe T3 compression fracture and kyphoplasty changes at T4-T7. 2. Subcentimeter right renal cortical hypodensity too small to characterize but likely a cyst and unchanged. 3. Aortic Atherosclerosis (ICD10-I70.0) and Emphysema (ICD10-J43.9). 4.  Atherosclerotic coronary artery disease. 5.  Stable early bilateral  avascular necrosis of the femoral heads. Electronically Signed   By: Marin Olp M.D.   On: 03/09/2018 09:00    ASSESSMENT AND PLAN: This is a very pleasant 68 years old white female with metastatic non-small cell lung cancer diagnosed in October 2007 status post concurrent chemoradiation followed by consolidation chemotherapy and she is currently on maintenance treatment with single agent Alimta status post 182 cycles. The patient has been tolerating this treatment well with no concerning adverse effects. She had repeat CT scan of the chest, abdomen and pelvis performed recently.  I personally and independently reviewed the scans and discussed the results with the patient and her daughter. Her scan showed no concerning findings for disease progression. I recommended for the patient to continue her current treatment with maintenance Alimta and she will proceed with cycle #183 today. The patient will continue with her current pain medication and will give her a refill as needed. She will come back for follow-up visit in 3 weeks for evaluation before the next cycle of her treatment. The patient was advised to call immediately if she has any concerning symptoms in the interval. The patient voices understanding of current disease status and treatment options and is in agreement with the current care plan. All questions were answered. The patient knows to call the clinic with any problems, questions or concerns. We can certainly see the patient much sooner if necessary.  Disclaimer: This note was dictated with voice recognition software. Similar sounding words can inadvertently be transcribed and may not be corrected upon review.

## 2018-03-13 ENCOUNTER — Telehealth: Payer: Self-pay | Admitting: Internal Medicine

## 2018-03-13 NOTE — Telephone Encounter (Signed)
MM PAL 3/24 moved appointments to 3/25. Confirmed with patient.

## 2018-03-16 ENCOUNTER — Telehealth: Payer: Self-pay | Admitting: Medical Oncology

## 2018-03-16 ENCOUNTER — Other Ambulatory Visit: Payer: Self-pay | Admitting: Internal Medicine

## 2018-03-16 DIAGNOSIS — C3411 Malignant neoplasm of upper lobe, right bronchus or lung: Secondary | ICD-10-CM

## 2018-03-16 DIAGNOSIS — C7931 Secondary malignant neoplasm of brain: Secondary | ICD-10-CM

## 2018-03-16 DIAGNOSIS — C3490 Malignant neoplasm of unspecified part of unspecified bronchus or lung: Secondary | ICD-10-CM

## 2018-03-16 DIAGNOSIS — C3410 Malignant neoplasm of upper lobe, unspecified bronchus or lung: Secondary | ICD-10-CM

## 2018-03-16 MED ORDER — MORPHINE SULFATE 30 MG PO TABS: 30.0000 mg | ORAL_TABLET | ORAL | 0 refills | Status: DC | PRN

## 2018-03-16 MED ORDER — MORPHINE SULFATE ER 60 MG PO TBCR: 60.0000 mg | EXTENDED_RELEASE_TABLET | Freq: Two times a day (BID) | ORAL | 0 refills | Status: DC

## 2018-03-19 ENCOUNTER — Other Ambulatory Visit: Payer: Self-pay | Admitting: Internal Medicine

## 2018-03-19 DIAGNOSIS — C7931 Secondary malignant neoplasm of brain: Secondary | ICD-10-CM

## 2018-03-29 ENCOUNTER — Other Ambulatory Visit: Payer: Self-pay | Admitting: Internal Medicine

## 2018-03-31 ENCOUNTER — Other Ambulatory Visit: Payer: Medicare Other

## 2018-03-31 ENCOUNTER — Ambulatory Visit: Payer: Medicare Other | Admitting: Internal Medicine

## 2018-03-31 ENCOUNTER — Ambulatory Visit: Payer: Medicare Other

## 2018-04-01 ENCOUNTER — Inpatient Hospital Stay (HOSPITAL_BASED_OUTPATIENT_CLINIC_OR_DEPARTMENT_OTHER): Payer: Medicare Other | Admitting: Internal Medicine

## 2018-04-01 ENCOUNTER — Inpatient Hospital Stay: Payer: Medicare Other

## 2018-04-01 ENCOUNTER — Other Ambulatory Visit: Payer: Self-pay

## 2018-04-01 ENCOUNTER — Encounter: Payer: Self-pay | Admitting: Internal Medicine

## 2018-04-01 ENCOUNTER — Telehealth: Payer: Self-pay | Admitting: Internal Medicine

## 2018-04-01 VITALS — BP 153/57 | HR 102 | Temp 97.8°F | Resp 18 | Ht 62.0 in | Wt 123.8 lb

## 2018-04-01 DIAGNOSIS — C3411 Malignant neoplasm of upper lobe, right bronchus or lung: Secondary | ICD-10-CM

## 2018-04-01 DIAGNOSIS — Z5111 Encounter for antineoplastic chemotherapy: Secondary | ICD-10-CM | POA: Diagnosis not present

## 2018-04-01 DIAGNOSIS — C7931 Secondary malignant neoplasm of brain: Secondary | ICD-10-CM

## 2018-04-01 DIAGNOSIS — C3490 Malignant neoplasm of unspecified part of unspecified bronchus or lung: Secondary | ICD-10-CM

## 2018-04-01 DIAGNOSIS — Z95828 Presence of other vascular implants and grafts: Secondary | ICD-10-CM

## 2018-04-01 LAB — COMPREHENSIVE METABOLIC PANEL
ALT: 21 U/L (ref 0–44)
AST: 28 U/L (ref 15–41)
Albumin: 3.4 g/dL — ABNORMAL LOW (ref 3.5–5.0)
Alkaline Phosphatase: 70 U/L (ref 38–126)
Anion gap: 9 (ref 5–15)
BUN: 20 mg/dL (ref 8–23)
CALCIUM: 9.6 mg/dL (ref 8.9–10.3)
CO2: 23 mmol/L (ref 22–32)
CREATININE: 0.79 mg/dL (ref 0.44–1.00)
Chloride: 108 mmol/L (ref 98–111)
GFR calc Af Amer: >60 mL/min (ref >60)
GFR calc non Af Amer: >60 mL/min (ref >60)
Glucose, Bld: 135 mg/dL — ABNORMAL HIGH (ref 70–99)
Potassium: 4.3 mmol/L (ref 3.5–5.1)
Sodium: 140 mmol/L (ref 135–145)
Total Bilirubin: 0.3 mg/dL (ref 0.3–1.2)
Total Protein: 6.9 g/dL (ref 6.5–8.1)

## 2018-04-01 LAB — CBC WITH DIFFERENTIAL/PLATELET
ABS IMMATURE GRANULOCYTES: 0.12 10*3/uL — AB (ref 0.00–0.07)
Basophils Absolute: 0 10*3/uL (ref 0.0–0.1)
Basophils Relative: 0 %
Eosinophils Absolute: 0 10*3/uL (ref 0.0–0.5)
Eosinophils Relative: 0 %
HCT: 36.1 % (ref 36.0–46.0)
Hemoglobin: 11.9 g/dL — ABNORMAL LOW (ref 12.0–15.0)
Immature Granulocytes: 1 %
Lymphocytes Relative: 6 %
Lymphs Abs: 1.2 10*3/uL (ref 0.7–4.0)
MCH: 32.8 pg (ref 26.0–34.0)
MCHC: 33 g/dL (ref 30.0–36.0)
MCV: 99.4 fL (ref 80.0–100.0)
Monocytes Absolute: 1.3 10*3/uL — ABNORMAL HIGH (ref 0.1–1.0)
Monocytes Relative: 7 %
Neutro Abs: 16.7 10*3/uL — ABNORMAL HIGH (ref 1.7–7.7)
Neutrophils Relative %: 86 %
Platelets: 431 10*3/uL — ABNORMAL HIGH (ref 150–400)
RBC: 3.63 MIL/uL — ABNORMAL LOW (ref 3.87–5.11)
RDW: 14.1 % (ref 11.5–15.5)
WBC: 19.3 10*3/uL — ABNORMAL HIGH (ref 4.0–10.5)
nRBC: 0 % (ref 0.0–0.2)

## 2018-04-01 MED ORDER — SODIUM CHLORIDE 0.9 % IV SOLN
500.0000 mg/m2 | Freq: Once | INTRAVENOUS | Status: AC
  Administered 2018-04-01: 800 mg via INTRAVENOUS
  Filled 2018-04-01: qty 20

## 2018-04-01 MED ORDER — ONDANSETRON HCL 8 MG PO TABS
8.0000 mg | ORAL_TABLET | Freq: Once | ORAL | Status: AC
  Administered 2018-04-01: 8 mg via ORAL

## 2018-04-01 MED ORDER — SODIUM CHLORIDE 0.9% FLUSH
10.0000 mL | INTRAVENOUS | Status: DC | PRN
  Administered 2018-04-01: 10 mL via INTRAVENOUS
  Filled 2018-04-01: qty 10

## 2018-04-01 MED ORDER — SODIUM CHLORIDE 0.9 % IJ SOLN
10.0000 mL | INTRAMUSCULAR | Status: DC | PRN
  Filled 2018-04-01: qty 10

## 2018-04-01 MED ORDER — HEPARIN SOD (PORK) LOCK FLUSH 100 UNIT/ML IV SOLN
500.0000 [IU] | Freq: Once | INTRAVENOUS | Status: AC | PRN
  Administered 2018-04-01: 500 [IU]
  Filled 2018-04-01: qty 5

## 2018-04-01 MED ORDER — SODIUM CHLORIDE 0.9 % IV SOLN
Freq: Once | INTRAVENOUS | Status: AC
  Administered 2018-04-01: 09:00:00 via INTRAVENOUS
  Filled 2018-04-01: qty 250

## 2018-04-01 MED ORDER — DEXAMETHASONE SODIUM PHOSPHATE 10 MG/ML IJ SOLN
INTRAMUSCULAR | Status: AC
  Filled 2018-04-01: qty 1

## 2018-04-01 MED ORDER — ONDANSETRON HCL 8 MG PO TABS
ORAL_TABLET | ORAL | Status: AC
  Filled 2018-04-01: qty 1

## 2018-04-01 MED ORDER — DEXAMETHASONE SODIUM PHOSPHATE 10 MG/ML IJ SOLN
10.0000 mg | Freq: Once | INTRAMUSCULAR | Status: AC
  Administered 2018-04-01: 10 mg via INTRAVENOUS

## 2018-04-01 NOTE — Patient Instructions (Signed)
[facility] Discharge Instructions for Patients Receiving Chemotherapy  Today you received the following chemotherapy agents alimta. To help prevent nausea and vomiting after your treatment, we encourage you to take your nausea medication as directed.    If you develop nausea and vomiting that is not controlled by your nausea medication, call the clinic.   BELOW ARE SYMPTOMS THAT SHOULD BE REPORTED IMMEDIATELY:  *FEVER GREATER THAN 100.5 F  *CHILLS WITH OR WITHOUT FEVER  NAUSEA AND VOMITING THAT IS NOT CONTROLLED WITH YOUR NAUSEA MEDICATION  *UNUSUAL SHORTNESS OF BREATH  *UNUSUAL BRUISING OR BLEEDING  TENDERNESS IN MOUTH AND THROAT WITH OR WITHOUT PRESENCE OF ULCERS  *URINARY PROBLEMS  *BOWEL PROBLEMS  UNUSUAL RASH Items with * indicate a potential emergency and should be followed up as soon as possible.  Feel free to call the clinic you have any questions or concerns. The clinic phone number is [phone].  Please show the [facility] at check-in to the Emergency Department and triage nurse.

## 2018-04-01 NOTE — Telephone Encounter (Signed)
Per 3/25 los, appt already scheduled.

## 2018-04-01 NOTE — Progress Notes (Signed)
[place] [phone]   [phone]  OFFICE PROGRESS NOTE  [name], MD [place] [address]  DIAGNOSIS: Metastatic non-small cell lung cancer initially diagnosed as locally advanced stage IIIB with a right Pancoast tumor involving the vertebral body as well as prominent canal invasion with spinal cord compression in October 2007. The patient also has metastatic disease to the brain in April 2008.  PRIOR THERAPY: 1. Status post concurrent chemoradiation with weekly carboplatin and paclitaxel, last dose was given November 18, 2005. 2. 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 3. Status post gamma knife radiotherapy to a solitary brain lesion located in the superior frontal area of the brain at [facility] in April of 2008. 4. Status post palliative radiotherapy to the lateral abdominal wall metastatic lesion under the care of Dr. Lisbeth Renshaw, completed March of 2009. 5. Status post 6 cycles of systemic chemotherapy with carboplatin and Alimta. Last dose was given July 26, 2007 with disease stabilization. 6. Gamma knife stereotactic radiotherapy to 2 brain lesions one involving the right frontal dural based as well as right parietal lesion performed on 05/07/2012 under the care of Dr. Vallarie Mare at [facility].  CURRENT THERAPY: Maintenance systemic chemotherapy with Alimta 500 MG/M2 every 3 weeks, status post 183 cycles.  INTERVAL HISTORY: Danielle [name] 68 y.o. female returns to the clinic today for follow-up visit.  The patient is feeling fine today with no concerning complaints.  She denied having any chest pain, shortness of breath, cough or hemoptysis.  She denied having any fever or chills.  She has no nausea, vomiting, diarrhea or constipation.  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   The patient is here today for evaluation before starting cycle #184.   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 had chemo and radiation for lung ca   Thrush 12/11/2010    ALLERGIES:  is allergic to contrast media [iodinated diagnostic agents]; iohexol; sulfa drugs cross reactors; and co-trimoxazole injection [sulfamethoxazole-trimethoprim].  MEDICATIONS:  Current Outpatient Medications  Medication Sig Dispense Refill   amLODipine (NORVASC) 10 MG tablet TAKE 1 TABLET (10 MG TOTAL) BY MOUTH DAILY. 90 tablet 1   aspirin 325 MG EC tablet TAKE 1 TABLET BY MOUTH EVERY DAY 90 tablet 0   atorvastatin (LIPITOR) 40 MG tablet TAKE 1 TABLET BY MOUTH EVERY DAY AT 6 PM 90 tablet 1   diphenhydrAMINE (BENADRYL) 25 MG tablet Take 1 tablet (25 mg total) by mouth as directed. 2 hours before CT scan. (Patient not taking: Reported on 01/27/2018) 5 tablet 0   FeFum-FePoly-FA-B Cmp-C-Biot (INTEGRA PLUS) CAPS Take 1 capsule by mouth every morning. 90 capsule 3   fluticasone (FLONASE) 50 MCG/ACT nasal spray Place 1-2 sprays into both nostrils daily as needed. 16 g 5   folic acid (FOLVITE) 1 MG tablet Take 1 tablet (1 mg total)  by mouth daily. 90 tablet 0   furosemide (LASIX) 20 MG tablet TAKE 1 TABLET BY MOUTH EVERY DAY AS NEEDED FOR SWELLING 20 tablet 1   lacosamide (VIMPAT) 50 MG TABS tablet Take by mouth.     lidocaine-prilocaine (EMLA) cream APPLY TO PORT-A-CATH AS DIRECTED 1-2 HOURS PRIOR TO CHEMOTHERAPY 30 g 1   lisinopril (PRINIVIL,ZESTRIL) 20 MG tablet TAKE 2 TABLETS (40 MG  TOTAL) BY MOUTH DAILY. 180 tablet 3   morphine (MS CONTIN) 60 MG 12 hr tablet Take 1 tablet (60 mg total) by mouth 2 (two) times daily. 60 tablet 0   morphine (MSIR) 30 MG tablet Take 1 tablet (30 mg total) by mouth every 4 (four) hours as needed (breakthrough pain). 60 tablet 0   Olopatadine HCl 0.2 % SOLN INSTILL 1 DROP INTO AFFECTED EYE(S) EVERY DAY 2.5 mL 1   ondansetron (ZOFRAN) 8 MG tablet TAKE 1 TABLET BY MOUTH EVERY 8 HOURS AS NEEDED FOR FOR NAUSEA OR VOMITING 30 tablet 0   predniSONE (DELTASONE) 50 MG tablet 1 tablet by mouth 13 hours ,7 hours and 1 hour  before CT scan 3 tablet 4   triamcinolone (KENALOG) 0.025 % ointment Apply 1 application topically 2 (two) times daily. 30 g 0   No current facility-administered medications for this visit.     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   LOOP RECORDER IMPLANT N/A 04/19/2014   Procedure: LOOP RECORDER IMPLANT;  Surgeon: Thompson Grayer, MD;  Location: [facility] CATH LAB;  Service: Cardiovascular;  Laterality: N/A;   OTHER SURGICAL HISTORY  2008   Gamma knife surgery to remove brain met    PORTACATH PLACEMENT  -[name]   TIP IN CAVOATRIAL JUNCTION   TEE WITHOUT CARDIOVERSION N/A 04/19/2014   Procedure: TRANSESOPHAGEAL ECHOCARDIOGRAM (TEE);  Surgeon: Larey Dresser, MD;  Location: [facility] ENDOSCOPY;  Service: Cardiovascular;  Laterality: N/A;    REVIEW OF SYSTEMS:  A comprehensive review of systems was negative.   PHYSICAL EXAMINATION: General appearance: alert, cooperative and no distress Head: Normocephalic, without obvious abnormality, atraumatic Neck: no adenopathy, no JVD, supple, symmetrical, trachea midline and thyroid not enlarged, symmetric, no tenderness/mass/nodules Lymph nodes: Cervical, supraclavicular, and axillary nodes normal. Resp: clear to auscultation  bilaterally Back: symmetric, no curvature. ROM normal. No CVA tenderness. Cardio: regular rate and rhythm, S1, S2 normal, no murmur, click, rub or gallop GI: soft, non-tender; bowel sounds normal; no masses,  no organomegaly Extremities: extremities normal, atraumatic, no cyanosis or edema  ECOG PERFORMANCE STATUS: 0 - Asymptomatic  Blood pressure (!) 153/57, pulse (!) 102, temperature 97.8 F (36.6 C), temperature source Oral, resp. rate 18, height '5\' 2"'  (1.575 m), weight 123 lb 12.8 oz (56.2 kg), SpO2 100 %.  LABORATORY DATA: Lab Results  Component Value Date   WBC 19.3 (H) 04/01/2018   HGB 11.9 (L) 04/01/2018   HCT 36.1 04/01/2018   MCV 99.4 04/01/2018   PLT 431 (H) 04/01/2018      Chemistry      Component Value Date/Time   NA 141 03/10/2018 0827   NA 142 12/24/2016 0816   K 4.0 03/10/2018 0827   K 4.4 12/24/2016 0816   CL 107 03/10/2018 0827   CL 106 06/30/2012 1004   CO2 26 03/10/2018 0827   CO2 28 12/24/2016  0816   BUN 16 03/10/2018 0827   BUN 15.8 12/24/2016 0816   CREATININE 0.82 03/10/2018 0827   CREATININE 0.87 07/22/2017 0826   CREATININE 1.1 12/24/2016 0816      Component Value Date/Time   CALCIUM 9.0 03/10/2018 0827   CALCIUM 9.6 12/24/2016 0816   ALKPHOS 76 03/10/2018 0827   ALKPHOS 80 12/24/2016 0816   AST 23 03/10/2018 0827   AST 20 07/22/2017 0826   AST 20 12/24/2016 0816   ALT 14 03/10/2018 0827   ALT 11 07/22/2017 0826   ALT 11 12/24/2016 0816   BILITOT 0.3 03/10/2018 0827   BILITOT 0.4 07/22/2017 0826   BILITOT 0.34 12/24/2016 0816       RADIOGRAPHIC STUDIES: Ct Chest W Contrast  Result Date: 03/09/2018 CLINICAL DATA:  History of lung cancer diagnosed 2007 with metastatic disease to spine, brain and omentum 2014. Ongoing chemotherapy. EXAM: CT CHEST, ABDOMEN, AND PELVIS WITHOUT AND WITH CONTRAST TECHNIQUE: Multidetector CT imaging of the chest, abdomen and pelvis was performed following the standard protocol before and during bolus  administration of intravenous contrast. Patient with contrast allergy as underwent 13 hour premedication regimen. CONTRAST:  51m OMNIPAQUE IOHEXOL 300 MG/ML  SOLN COMPARISON:  09/18/2017, 10/03/2009, 10/14/2005 FINDINGS: CT CHEST FINDINGS Cardiovascular: Heart is normal size. There is calcified plaque over the left main, left anterior descending and lateral circumflex coronary arteries. Mild calcified plaque over the thoracic aorta. Thoracic aorta is otherwise normal in caliber. Pulmonary arterial system is unremarkable. Mediastinum/Nodes: No mediastinal or hilar adenopathy. Remaining mediastinal structures are normal. Lungs/Pleura: Right IJ Port-A-Cath has tip over the cavoatrial junction. Mild to moderate centrilobular emphysematous disease. Biapical pleural thickening right greater than left. Posteromedial right apical focal pleural thickening measuring approximately 1.3 x 2 cm with single small associated calcification unchanged as this is the area of patient's original lung cancer. Minimal focal pleural based nodularity over the anterior right upper lobe unchanged. Right paramediastinal/medial lung fibrosis likely post radiation change. Subtle focal stable scarring over the lateral right upper lobe. Remainder of the lungs are clear. There is no focal consolidation. Tiny amount right pleural fluid unchanged. Airways are unremarkable. Musculoskeletal: Mild degenerative change of the spine. Stable severe compression fracture of T3. Prior T4 and T7 kyphoplasties. CT ABDOMEN PELVIS FINDINGS Hepatobiliary: Liver, gallbladder and biliary tree are within normal. Pancreas: Normal. Spleen: Normal. Adrenals/Urinary Tract: Adrenal glands are normal. Kidneys normal in size without hydronephrosis or nephrolithiasis. Subcentimeter upper pole right renal cortical hypodensity too small to characterize but likely a cyst and unchanged. Ureters and bladder are normal. Stomach/Bowel: Stomach and small bowel are unremarkable.  Appendix is within normal. Colon is unremarkable. Vascular/Lymphatic: Moderate calcified plaque over the abdominal aorta and iliac arteries. No adenopathy. Reproductive: Normal. Other: No free fluid or focal inflammatory change. Musculoskeletal: Mild degenerate change of the spine. Mild sclerosis along the right side of the L1 vertebral body unchanged and may be due to metastatic disease. Subtle patchy lucency over the lumbosacral spine unchanged and could be due to metastatic disease. 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 IMPRESSION: 1. Known stable pleural-based lung cancer over the posteromedial right apex measuring 1.3 x 2 cm. Post radiation fibrosis over the medial right lung with biapical pleural thickening. Stable small amount right pleural fluid. No adenopathy. No evidence of metastatic disease within the abdomen/pelvis. Mild L1 sclerosis with minimal patchy lucency over the lumbosacral spine unchanged as findings could be due to metastatic disease. Stable severe T3 compression fracture and kyphoplasty changes at T4-T7.  2. Subcentimeter right renal cortical hypodensity too small to characterize but likely a cyst and unchanged. 3. Aortic Atherosclerosis (ICD10-I70.0) and Emphysema (ICD10-J43.9). 4.  Atherosclerotic coronary artery disease. 5.  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 Electronically Signed   By: Marin Olp M.D.   On: 03/09/2018 09:00   Ct Abdomen Pelvis W Contrast  Result Date: 03/09/2018 CLINICAL DATA:  History of lung cancer diagnosed 2007 with metastatic disease to spine, brain and omentum 2014. Ongoing chemotherapy. EXAM: CT CHEST, ABDOMEN, AND PELVIS WITHOUT AND WITH CONTRAST TECHNIQUE: Multidetector CT imaging of the chest, abdomen and pelvis was performed following the standard protocol before and during bolus administration of intravenous contrast. Patient with contrast allergy as underwent 13 hour premedication regimen. CONTRAST:  3m  OMNIPAQUE IOHEXOL 300 MG/ML  SOLN COMPARISON:  09/18/2017, 10/03/2009, 10/14/2005 FINDINGS: CT CHEST FINDINGS Cardiovascular: Heart is normal size. There is calcified plaque over the left main, left anterior descending and lateral circumflex coronary arteries. Mild calcified plaque over the thoracic aorta. Thoracic aorta is otherwise normal in caliber. Pulmonary arterial system is unremarkable. Mediastinum/Nodes: No mediastinal or hilar adenopathy. Remaining mediastinal structures are normal. Lungs/Pleura: Right IJ Port-A-Cath has tip over the cavoatrial junction. Mild to moderate centrilobular emphysematous disease. Biapical pleural thickening right greater than left. Posteromedial right apical focal pleural thickening measuring approximately 1.3 x 2 cm with single small associated calcification unchanged as this is the area of patient's original lung cancer. Minimal focal pleural based nodularity over the anterior right upper lobe unchanged. Right paramediastinal/medial lung fibrosis likely post radiation change. Subtle focal stable scarring over the lateral right upper lobe. Remainder of the lungs are clear. There is no focal consolidation. Tiny amount right pleural fluid unchanged. Airways are unremarkable. Musculoskeletal: Mild degenerative change of the spine. Stable severe compression fracture of T3. Prior T4 and T7 kyphoplasties. CT ABDOMEN PELVIS FINDINGS Hepatobiliary: Liver, gallbladder and biliary tree are within normal. Pancreas: Normal. Spleen: Normal. Adrenals/Urinary Tract: Adrenal glands are normal. Kidneys normal in size without hydronephrosis or nephrolithiasis. Subcentimeter upper pole right renal cortical hypodensity too small to characterize but likely a cyst and unchanged. Ureters and bladder are normal. Stomach/Bowel: Stomach and small bowel are unremarkable. Appendix is within normal. Colon is unremarkable. Vascular/Lymphatic: Moderate calcified plaque over the abdominal aorta and iliac  arteries. No adenopathy. Reproductive: Normal. Other: No free fluid or focal inflammatory change. Musculoskeletal: Mild degenerate change of the spine. Mild sclerosis along the right side of the L1 vertebral body unchanged and may be due to metastatic disease. Subtle patchy lucency over the lumbosacral spine unchanged and could be due to metastatic disease. 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 IMPRESSION: 1. Known stable pleural-based lung cancer over the posteromedial right apex measuring 1.3 x 2 cm. Post radiation fibrosis over the medial right lung with biapical pleural thickening. Stable small amount right pleural fluid. No adenopathy. No evidence of metastatic disease within the abdomen/pelvis. Mild L1 sclerosis with minimal patchy lucency over the lumbosacral spine unchanged as findings could be due to metastatic disease. Stable severe T3 compression fracture and kyphoplasty changes at T4-T7. 2. Subcentimeter right renal cortical hypodensity too small to characterize but likely a cyst and unchanged. 3. Aortic Atherosclerosis (ICD10-I70.0) and Emphysema (ICD10-J43.9). 4.  Atherosclerotic coronary artery disease. 5.  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 Electronically Signed   By: DMarin OlpM.D.   On: 03/09/2018 09:00    ASSESSMENT AND PLAN: This is a very pleasant 68years old  white female with metastatic non-small cell lung cancer diagnosed in October 2007 status post concurrent chemoradiation followed by consolidation chemotherapy and she is currently on maintenance treatment with single agent Alimta status post 183 cycles. The patient has been tolerating this treatment well with no concerning adverse effects. I recommended for the patient to proceed with cycle #184 today as scheduled. The patient will come back for follow-up visit in 3 weeks for evaluation before the next cycle of her treatment. She was advised to call immediately if she has any  concerning symptoms in the interval. The patient voices understanding of current disease status and treatment options and is in agreement with the current care plan. All questions were answered. The patient knows to call the clinic with any problems, questions or concerns. We can certainly see the patient much sooner if necessary.  Disclaimer: This note was dictated with voice recognition software. Similar sounding words can inadvertently be transcribed and may not be corrected upon review.

## 2018-04-14 ENCOUNTER — Telehealth: Payer: Self-pay | Admitting: *Deleted

## 2018-04-14 NOTE — Telephone Encounter (Signed)
Received TC from patient requesting refills on: - MSIR 30 mg tabs, last filled 03/16/18 - MSContin 60 mg, last filled on 03/16/18  She uses [facility] in [place]

## 2018-04-16 ENCOUNTER — Telehealth: Payer: Self-pay | Admitting: *Deleted

## 2018-04-16 ENCOUNTER — Other Ambulatory Visit: Payer: Self-pay | Admitting: Internal Medicine

## 2018-04-16 DIAGNOSIS — C3490 Malignant neoplasm of unspecified part of unspecified bronchus or lung: Secondary | ICD-10-CM

## 2018-04-16 DIAGNOSIS — C3410 Malignant neoplasm of upper lobe, unspecified bronchus or lung: Secondary | ICD-10-CM

## 2018-04-16 MED ORDER — MORPHINE SULFATE ER 60 MG PO TBCR: 60.0000 mg | EXTENDED_RELEASE_TABLET | Freq: Two times a day (BID) | ORAL | 0 refills | Status: DC

## 2018-04-16 MED ORDER — MORPHINE SULFATE 30 MG PO TABS: 30.0000 mg | ORAL_TABLET | ORAL | 0 refills | Status: DC | PRN

## 2018-04-16 NOTE — Telephone Encounter (Signed)
Received call from patient with 2nd request for her MSIR and MS Contin refills Both last refilled on 03/16/18

## 2018-04-21 ENCOUNTER — Inpatient Hospital Stay (HOSPITAL_BASED_OUTPATIENT_CLINIC_OR_DEPARTMENT_OTHER): Payer: Medicare Other | Admitting: Internal Medicine

## 2018-04-21 ENCOUNTER — Inpatient Hospital Stay: Payer: Medicare Other

## 2018-04-21 ENCOUNTER — Other Ambulatory Visit: Payer: Self-pay

## 2018-04-21 ENCOUNTER — Encounter: Payer: Self-pay | Admitting: Internal Medicine

## 2018-04-21 ENCOUNTER — Inpatient Hospital Stay: Payer: Medicare Other | Attending: Internal Medicine

## 2018-04-21 VITALS — BP 154/67 | HR 97 | Temp 98.0°F | Resp 17 | Ht 62.0 in | Wt 121.9 lb

## 2018-04-21 DIAGNOSIS — C3490 Malignant neoplasm of unspecified part of unspecified bronchus or lung: Secondary | ICD-10-CM

## 2018-04-21 DIAGNOSIS — Z5111 Encounter for antineoplastic chemotherapy: Secondary | ICD-10-CM

## 2018-04-21 DIAGNOSIS — C3411 Malignant neoplasm of upper lobe, right bronchus or lung: Secondary | ICD-10-CM | POA: Diagnosis not present

## 2018-04-21 DIAGNOSIS — R110 Nausea: Secondary | ICD-10-CM | POA: Diagnosis not present

## 2018-04-21 DIAGNOSIS — C7931 Secondary malignant neoplasm of brain: Secondary | ICD-10-CM

## 2018-04-21 DIAGNOSIS — Z95828 Presence of other vascular implants and grafts: Secondary | ICD-10-CM

## 2018-04-21 LAB — CBC WITH DIFFERENTIAL/PLATELET
Abs Immature Granulocytes: 0.03 10*3/uL (ref 0.00–0.07)
Basophils Absolute: 0 10*3/uL (ref 0.0–0.1)
Basophils Relative: 0 %
Eosinophils Absolute: 0.1 10*3/uL (ref 0.0–0.5)
Eosinophils Relative: 1 %
HCT: 37.6 % (ref 36.0–46.0)
Hemoglobin: 12.3 g/dL (ref 12.0–15.0)
Immature Granulocytes: 0 %
Lymphocytes Relative: 21 %
Lymphs Abs: 2 10*3/uL (ref 0.7–4.0)
MCH: 32.7 pg (ref 26.0–34.0)
MCHC: 32.7 g/dL (ref 30.0–36.0)
MCV: 100 fL (ref 80.0–100.0)
Monocytes Absolute: 1 10*3/uL (ref 0.1–1.0)
Monocytes Relative: 10 %
Neutro Abs: 6.6 10*3/uL (ref 1.7–7.7)
Neutrophils Relative %: 68 %
Platelets: 337 10*3/uL (ref 150–400)
RBC: 3.76 MIL/uL — ABNORMAL LOW (ref 3.87–5.11)
RDW: 13.5 % (ref 11.5–15.5)
WBC: 9.7 10*3/uL (ref 4.0–10.5)
nRBC: 0 % (ref 0.0–0.2)

## 2018-04-21 LAB — COMPREHENSIVE METABOLIC PANEL
ALT: 15 U/L (ref 0–44)
AST: 22 U/L (ref 15–41)
Albumin: 3.4 g/dL — ABNORMAL LOW (ref 3.5–5.0)
Alkaline Phosphatase: 73 U/L (ref 38–126)
Anion gap: 11 (ref 5–15)
BUN: 12 mg/dL (ref 8–23)
CO2: 23 mmol/L (ref 22–32)
Calcium: 9.3 mg/dL (ref 8.9–10.3)
Chloride: 107 mmol/L (ref 98–111)
Creatinine, Ser: 0.71 mg/dL (ref 0.44–1.00)
GFR calc Af Amer: >60 mL/min (ref >60)
GFR calc non Af Amer: >60 mL/min (ref >60)
Glucose, Bld: 102 mg/dL — ABNORMAL HIGH (ref 70–99)
Potassium: 4 mmol/L (ref 3.5–5.1)
Sodium: 141 mmol/L (ref 135–145)
Total Bilirubin: 0.4 mg/dL (ref 0.3–1.2)
Total Protein: 6.7 g/dL (ref 6.5–8.1)

## 2018-04-21 MED ORDER — ONDANSETRON HCL 8 MG PO TABS: ORAL_TABLET | ORAL | 0 refills | Status: DC

## 2018-04-21 MED ORDER — DEXAMETHASONE SODIUM PHOSPHATE 10 MG/ML IJ SOLN
10.0000 mg | Freq: Once | INTRAMUSCULAR | Status: AC
  Administered 2018-04-21: 10 mg via INTRAVENOUS

## 2018-04-21 MED ORDER — SODIUM CHLORIDE 0.9 % IJ SOLN
10.0000 mL | INTRAMUSCULAR | Status: DC | PRN
  Filled 2018-04-21: qty 10

## 2018-04-21 MED ORDER — SODIUM CHLORIDE 0.9 % IV SOLN
Freq: Once | INTRAVENOUS | Status: AC
  Administered 2018-04-21: 10:00:00 via INTRAVENOUS
  Filled 2018-04-21: qty 250

## 2018-04-21 MED ORDER — ONDANSETRON HCL 8 MG PO TABS
8.0000 mg | ORAL_TABLET | Freq: Once | ORAL | Status: AC
  Administered 2018-04-21: 10:00:00 8 mg via ORAL

## 2018-04-21 MED ORDER — HEPARIN SOD (PORK) LOCK FLUSH 100 UNIT/ML IV SOLN
500.0000 [IU] | Freq: Once | INTRAVENOUS | Status: AC | PRN
  Administered 2018-04-21: 11:00:00 500 [IU]
  Filled 2018-04-21: qty 5

## 2018-04-21 MED ORDER — SODIUM CHLORIDE 0.9 % IV SOLN
500.0000 mg/m2 | Freq: Once | INTRAVENOUS | Status: AC
  Administered 2018-04-21: 800 mg via INTRAVENOUS
  Filled 2018-04-21: qty 20

## 2018-04-21 MED ORDER — SODIUM CHLORIDE 0.9% FLUSH
10.0000 mL | INTRAVENOUS | Status: DC | PRN
  Administered 2018-04-21: 09:00:00 10 mL via INTRAVENOUS
  Filled 2018-04-21: qty 10

## 2018-04-21 MED ORDER — DEXAMETHASONE SODIUM PHOSPHATE 10 MG/ML IJ SOLN
INTRAMUSCULAR | Status: AC
  Filled 2018-04-21: qty 1

## 2018-04-21 MED ORDER — ONDANSETRON HCL 8 MG PO TABS
ORAL_TABLET | ORAL | Status: AC
  Filled 2018-04-21: qty 1

## 2018-04-21 NOTE — Progress Notes (Signed)
[name] Telephone:(336) [phone]   Fax:(336) [phone]  OFFICE PROGRESS NOTE  Danielle Sanders, MD [place] [address]  DIAGNOSIS: Metastatic non-small cell lung cancer initially diagnosed as locally advanced stage IIIB with a right Pancoast tumor involving the vertebral body as well as prominent canal invasion with spinal cord compression in October 2007. The patient also has metastatic disease to the brain in April 2008.  PRIOR THERAPY: 1. Status post concurrent chemoradiation with weekly carboplatin and paclitaxel, last dose was given November 18, 2005. 2. 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 3. Status post gamma knife radiotherapy to a solitary brain lesion located in the superior frontal area of the brain at [facility] in April of 2008. 4. Status post palliative radiotherapy to the lateral abdominal wall metastatic lesion under the care of Dr. Lisbeth Renshaw, completed March of 2009. 5. Status post 6 cycles of systemic chemotherapy with carboplatin and Alimta. Last dose was given July 26, 2007 with disease stabilization. 6. Gamma knife stereotactic radiotherapy to 2 brain lesions one involving the right frontal dural based as well as right parietal lesion performed on 05/07/2012 under the care of Dr. Vallarie Mare at [facility].  CURRENT THERAPY: Maintenance systemic chemotherapy with Alimta 500 MG/M2 every 3 weeks, status post 184 cycles.  INTERVAL HISTORY: Danielle [name] 68 y.o. female returns to the clinic today for follow-up visit.  The patient is feeling fine today with no concerning complaints.  She continues to have intermittent nausea and requesting refill of Zofran.  She denied having any current chest pain, shortness of breath, cough or hemoptysis.  She denied having any fever or chills.  She has no current nausea, vomiting, diarrhea or constipation.   She denied having any headache or visual changes.  She is here today for evaluation before starting cycle #185.   MEDICAL HISTORY: Past Medical History:  Diagnosis Date  . Anemia   . 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 CVA (cerebral vascular accident) ([place]) 04/2014   pt states had 2 cva's within 2 wks  . DJD (degenerative joint disease), cervical   . Fibromyalgia   . H/O: pneumonia   . History of tobacco abuse quit 10/08   on nicotine patch  . Hypertension   . Hypokalemia   . Lung cancer ([place]) dx'd 09/2005   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 had chemo and radiation for lung ca  . Thrush 12/11/2010    ALLERGIES:  is allergic to contrast media [iodinated diagnostic agents]; iohexol; sulfa drugs cross reactors; and co-trimoxazole injection [sulfamethoxazole-trimethoprim].  MEDICATIONS:  Current Outpatient Medications  Medication Sig Dispense Refill  . amLODipine (NORVASC) 10 MG tablet TAKE 1 TABLET (10 MG TOTAL) BY MOUTH DAILY. 90 tablet 1  . aspirin 325 MG EC tablet TAKE 1 TABLET BY MOUTH EVERY DAY 90 tablet 0  . atorvastatin (LIPITOR) 40 MG tablet TAKE 1 TABLET BY MOUTH EVERY DAY AT 6 PM 90 tablet 1  . diphenhydrAMINE (BENADRYL) 25 MG tablet Take 1 tablet (25 mg total) by mouth as directed. 2 hours before CT scan. (Patient not taking: Reported on 01/27/2018) 5 tablet 0  . FeFum-FePoly-FA-B Cmp-C-Biot (INTEGRA PLUS) CAPS Take 1 capsule by mouth every morning. 90 capsule 3  . fluticasone (FLONASE) 50 MCG/ACT nasal spray Place 1-2 sprays into both nostrils daily as needed. 16 g 5  . folic acid (FOLVITE) 1 MG tablet  Take 1 tablet (1 mg total) by mouth daily. 90 tablet 0  . furosemide (LASIX) 20 MG tablet TAKE 1 TABLET BY MOUTH EVERY DAY AS NEEDED FOR SWELLING 20 tablet 1  . lacosamide (VIMPAT) 50 MG TABS tablet Take by mouth.    . lidocaine-prilocaine (EMLA) cream APPLY TO PORT-A-CATH AS DIRECTED 1-2 HOURS PRIOR TO CHEMOTHERAPY 30 g 1  . lisinopril  (PRINIVIL,ZESTRIL) 20 MG tablet TAKE 2 TABLETS (40 MG TOTAL) BY MOUTH DAILY. 180 tablet 3  . morphine (MS CONTIN) 60 MG 12 hr tablet Take 1 tablet (60 mg total) by mouth 2 (two) times daily. 60 tablet 0  . morphine (MSIR) 30 MG tablet Take 1 tablet (30 mg total) by mouth every 4 (four) hours as needed (breakthrough pain). 60 tablet 0  . Olopatadine HCl 0.2 % SOLN INSTILL 1 DROP INTO AFFECTED EYE(S) EVERY DAY 2.5 mL 1  . ondansetron (ZOFRAN) 8 MG tablet TAKE 1 TABLET BY MOUTH EVERY 8 HOURS AS NEEDED FOR FOR NAUSEA OR VOMITING 30 tablet 0  . predniSONE (DELTASONE) 50 MG tablet 1 tablet by mouth 13 hours ,7 hours and 1 hour  before CT scan 3 tablet 4  . triamcinolone (KENALOG) 0.025 % ointment Apply 1 application topically 2 (two) times daily. 30 g 0   No current facility-administered medications for this visit.     SURGICAL HISTORY:  Past Surgical History:  Procedure Laterality Date  . CERVICAL LAMINECTOMY  1995  . KNEE SURGERY  1990   Left x 2  . 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 LOOP RECORDER IMPLANT N/A 04/19/2014   Procedure: LOOP RECORDER IMPLANT;  Surgeon: Thompson Grayer, MD;  Location: [facility] CATH LAB;  Service: Cardiovascular;  Laterality: N/A;  . OTHER SURGICAL HISTORY  2008   Gamma knife surgery to remove brain met   . PORTACATH PLACEMENT  -[name]   TIP IN CAVOATRIAL JUNCTION  . TEE WITHOUT CARDIOVERSION N/A 04/19/2014   Procedure: TRANSESOPHAGEAL ECHOCARDIOGRAM (TEE);  Surgeon: Larey Dresser, MD;  Location: [place];  Service: Cardiovascular;  Laterality: N/A;    REVIEW OF SYSTEMS:  A comprehensive review of systems was negative except for: Gastrointestinal: positive for nausea   PHYSICAL EXAMINATION: General appearance: alert, cooperative and no distress Head: Normocephalic, without obvious abnormality, atraumatic Neck: no adenopathy, no JVD, supple, symmetrical, trachea midline and thyroid not enlarged, symmetric, no  tenderness/mass/nodules Lymph nodes: Cervical, supraclavicular, and axillary nodes normal. Resp: clear to auscultation bilaterally Back: symmetric, no curvature. ROM normal. No CVA tenderness. Cardio: regular rate and rhythm, S1, S2 normal, no murmur, click, rub or gallop GI: soft, non-tender; bowel sounds normal; no masses,  no organomegaly Extremities: extremities normal, atraumatic, no cyanosis or edema  ECOG PERFORMANCE STATUS: 1 - Symptomatic but completely ambulatory  Blood pressure (!) 154/67, pulse 97, temperature 98 F (36.7 C), temperature source Oral, resp. rate 17, height '5\' 2"'  (1.575 m), weight 121 lb 14.4 oz (55.3 kg), SpO2 95 %.  LABORATORY DATA: Lab Results  Component Value Date   WBC 9.7 04/21/2018   HGB 12.3 04/21/2018   HCT 37.6 04/21/2018   MCV 100.0 04/21/2018   PLT 337 04/21/2018      Chemistry      Component Value Date/Time   NA 140 04/01/2018 0721   NA 142 12/24/2016 0816   K 4.3 04/01/2018 0721   K 4.4 12/24/2016 0816   CL 108 04/01/2018 0721   CL 106 06/30/2012 1004  CO2 23 04/01/2018 0721   CO2 28 12/24/2016 0816   BUN 20 04/01/2018 0721   BUN 15.8 12/24/2016 0816   CREATININE 0.79 04/01/2018 0721   CREATININE 0.87 07/22/2017 0826   CREATININE 1.1 12/24/2016 0816      Component Value Date/Time   CALCIUM 9.6 04/01/2018 0721   CALCIUM 9.6 12/24/2016 0816   ALKPHOS 70 04/01/2018 0721   ALKPHOS 80 12/24/2016 0816   AST 28 04/01/2018 0721   AST 20 07/22/2017 0826   AST 20 12/24/2016 0816   ALT 21 04/01/2018 0721   ALT 11 07/22/2017 0826   ALT 11 12/24/2016 0816   BILITOT 0.3 04/01/2018 0721   BILITOT 0.4 07/22/2017 0826   BILITOT 0.34 12/24/2016 0816       RADIOGRAPHIC STUDIES: No results found.  ASSESSMENT AND PLAN: This is a very pleasant 68 years old white female with metastatic non-small cell lung cancer diagnosed in October 2007 status post concurrent chemoradiation followed by consolidation chemotherapy and she is currently on  maintenance treatment with single agent Alimta status post 184 cycles. The patient continues to tolerate this treatment well with no concerning adverse effects. I recommended for her to proceed with cycle #185 today as scheduled. I will see her back for follow-up visit in 3 weeks for evaluation before the next cycle of her treatment. She was advised to call immediately if she has any concerning symptoms in the interval. For the intermittent nausea, I gave her refill of Zofran. The patient voices understanding of current disease status and treatment options and is in agreement with the current care plan. All questions were answered. The patient knows to call the clinic with any problems, questions or concerns. We can certainly see the patient much sooner if necessary.  Disclaimer: This note was dictated with voice recognition software. Similar sounding words can inadvertently be transcribed and may not be corrected upon review.

## 2018-04-21 NOTE — Patient Instructions (Signed)
[facility] Discharge Instructions for Patients Receiving Chemotherapy  Today you received the following chemotherapy agents alimta. To help prevent nausea and vomiting after your treatment, we encourage you to take your nausea medication as directed.    If you develop nausea and vomiting that is not controlled by your nausea medication, call the clinic.   BELOW ARE SYMPTOMS THAT SHOULD BE REPORTED IMMEDIATELY:  *FEVER GREATER THAN 100.5 F  *CHILLS WITH OR WITHOUT FEVER  NAUSEA AND VOMITING THAT IS NOT CONTROLLED WITH YOUR NAUSEA MEDICATION  *UNUSUAL SHORTNESS OF BREATH  *UNUSUAL BRUISING OR BLEEDING  TENDERNESS IN MOUTH AND THROAT WITH OR WITHOUT PRESENCE OF ULCERS  *URINARY PROBLEMS  *BOWEL PROBLEMS  UNUSUAL RASH Items with * indicate a potential emergency and should be followed up as soon as possible.  Feel free to call the clinic you have any questions or concerns. The clinic phone number is [phone].  Please show the [facility] at check-in to the Emergency Department and triage nurse.   Coronavirus (COVID-19) Are you at risk?  Are you at risk for the Coronavirus (COVID-19)?  To be considered HIGH RISK for Coronavirus (COVID-19), you have to meet the following criteria:  . Traveled to [place], [place], [place], [place] or [place]; or in the [place] to [place], [place], [place], or [place]; and have fever, cough, and shortness of breath within the last 2 weeks of travel OR . Been in close contact with a person diagnosed with COVID-19 within the last 2 weeks and have fever, cough, and shortness of breath . IF YOU DO NOT MEET THESE CRITERIA, YOU ARE CONSIDERED LOW RISK FOR COVID-19.  What to do if you are HIGH RISK for COVID-19?  Marland Kitchen If you are having a medical emergency, call 911. . Seek medical care right away. Before you go to a doctor's office, urgent care or emergency department, call ahead and tell them about your recent  travel, contact with someone diagnosed with COVID-19, and your symptoms. You should receive instructions from your physician's office regarding next steps of care.  . When you arrive at healthcare provider, tell the healthcare staff immediately you have returned from visiting [place], [place], [place], [place] or [place]; or traveled in the [place] to [place], [place], [place], or [place]; in the last two weeks or you have been in close contact with a person diagnosed with COVID-19 in the last 2 weeks.   . Tell the health care staff about your symptoms: fever, cough and shortness of breath. . After you have been seen by a medical provider, you will be either: o Tested for (COVID-19) and discharged home on quarantine except to seek medical care if symptoms worsen, and asked to  - Stay home and avoid contact with others until you get your results (4-5 days)  - Avoid travel on public transportation if possible (such as bus, train, or airplane) or o Sent to the Emergency Department by EMS for evaluation, COVID-19 testing, and possible admission depending on your condition and test results.  What to do if you are LOW RISK for COVID-19?  Reduce your risk of any infection by using the same precautions used for avoiding the common cold or flu:  Marland Kitchen Wash your hands often with soap and warm water for at least 20 seconds.  If soap and water are not readily available, use an alcohol-based hand sanitizer with at least 60% alcohol.  . If coughing or sneezing,  cover your mouth and nose by coughing or sneezing into the elbow areas of your shirt or coat, into a tissue or into your sleeve (not your hands). . Avoid shaking hands with others and consider head nods or verbal greetings only. . Avoid touching your eyes, nose, or mouth with unwashed hands.  . Avoid close contact with people who are sick. . Avoid places or events with large numbers of people in one location, like concerts or sporting  events. . Carefully consider travel plans you have or are making. . If you are planning any travel outside or inside the [place], visit the CDC's Travelers' Health webpage for the latest health notices. . If you have some symptoms but not all symptoms, continue to monitor at home and seek medical attention if your symptoms worsen. . If you are having a medical emergency, call 911.   [place] / e-Visit: [email or web address]         [facility]: [facility]: [phone]                   [facility]: [phone]

## 2018-04-27 DIAGNOSIS — Z8673 Personal history of transient ischemic attack (TIA), and cerebral infarction without residual deficits: Secondary | ICD-10-CM | POA: Diagnosis not present

## 2018-04-27 DIAGNOSIS — C3490 Malignant neoplasm of unspecified part of unspecified bronchus or lung: Secondary | ICD-10-CM | POA: Diagnosis not present

## 2018-04-27 DIAGNOSIS — C7931 Secondary malignant neoplasm of brain: Secondary | ICD-10-CM | POA: Diagnosis not present

## 2018-05-12 ENCOUNTER — Other Ambulatory Visit: Payer: Self-pay

## 2018-05-12 ENCOUNTER — Inpatient Hospital Stay (HOSPITAL_BASED_OUTPATIENT_CLINIC_OR_DEPARTMENT_OTHER): Payer: Medicare Other | Admitting: Internal Medicine

## 2018-05-12 ENCOUNTER — Inpatient Hospital Stay: Payer: Medicare Other

## 2018-05-12 ENCOUNTER — Encounter: Payer: Self-pay | Admitting: Internal Medicine

## 2018-05-12 ENCOUNTER — Inpatient Hospital Stay: Payer: Medicare Other | Attending: Internal Medicine

## 2018-05-12 ENCOUNTER — Telehealth: Payer: Self-pay | Admitting: Internal Medicine

## 2018-05-12 VITALS — BP 164/61 | HR 88 | Temp 97.6°F | Resp 20 | Ht 62.0 in | Wt 130.5 lb

## 2018-05-12 DIAGNOSIS — Z9221 Personal history of antineoplastic chemotherapy: Secondary | ICD-10-CM | POA: Insufficient documentation

## 2018-05-12 DIAGNOSIS — Z8673 Personal history of transient ischemic attack (TIA), and cerebral infarction without residual deficits: Secondary | ICD-10-CM | POA: Insufficient documentation

## 2018-05-12 DIAGNOSIS — C7931 Secondary malignant neoplasm of brain: Secondary | ICD-10-CM

## 2018-05-12 DIAGNOSIS — I10 Essential (primary) hypertension: Secondary | ICD-10-CM | POA: Insufficient documentation

## 2018-05-12 DIAGNOSIS — Z5111 Encounter for antineoplastic chemotherapy: Secondary | ICD-10-CM | POA: Diagnosis not present

## 2018-05-12 DIAGNOSIS — Z87891 Personal history of nicotine dependence: Secondary | ICD-10-CM | POA: Diagnosis not present

## 2018-05-12 DIAGNOSIS — C3411 Malignant neoplasm of upper lobe, right bronchus or lung: Secondary | ICD-10-CM

## 2018-05-12 DIAGNOSIS — Z7982 Long term (current) use of aspirin: Secondary | ICD-10-CM | POA: Diagnosis not present

## 2018-05-12 DIAGNOSIS — Z923 Personal history of irradiation: Secondary | ICD-10-CM

## 2018-05-12 DIAGNOSIS — Z79899 Other long term (current) drug therapy: Secondary | ICD-10-CM

## 2018-05-12 DIAGNOSIS — Z95828 Presence of other vascular implants and grafts: Secondary | ICD-10-CM

## 2018-05-12 DIAGNOSIS — Z7951 Long term (current) use of inhaled steroids: Secondary | ICD-10-CM | POA: Insufficient documentation

## 2018-05-12 DIAGNOSIS — C3490 Malignant neoplasm of unspecified part of unspecified bronchus or lung: Secondary | ICD-10-CM

## 2018-05-12 LAB — COMPREHENSIVE METABOLIC PANEL
ALT: 15 U/L (ref 0–44)
AST: 22 U/L (ref 15–41)
Albumin: 3.3 g/dL — ABNORMAL LOW (ref 3.5–5.0)
Alkaline Phosphatase: 67 U/L (ref 38–126)
Anion gap: 9 (ref 5–15)
BUN: 13 mg/dL (ref 8–23)
CO2: 27 mmol/L (ref 22–32)
Calcium: 9.2 mg/dL (ref 8.9–10.3)
Chloride: 105 mmol/L (ref 98–111)
Creatinine, Ser: 0.79 mg/dL (ref 0.44–1.00)
GFR calc Af Amer: >60 mL/min (ref >60)
GFR calc non Af Amer: >60 mL/min (ref >60)
Glucose, Bld: 167 mg/dL — ABNORMAL HIGH (ref 70–99)
Potassium: 4.1 mmol/L (ref 3.5–5.1)
Sodium: 141 mmol/L (ref 135–145)
Total Bilirubin: 0.3 mg/dL (ref 0.3–1.2)
Total Protein: 6.6 g/dL (ref 6.5–8.1)

## 2018-05-12 LAB — CBC WITH DIFFERENTIAL/PLATELET
Abs Immature Granulocytes: 0.03 10*3/uL (ref 0.00–0.07)
Basophils Absolute: 0 10*3/uL (ref 0.0–0.1)
Basophils Relative: 0 %
Eosinophils Absolute: 0.1 10*3/uL (ref 0.0–0.5)
Eosinophils Relative: 1 %
HCT: 36.7 % (ref 36.0–46.0)
Hemoglobin: 12 g/dL (ref 12.0–15.0)
Immature Granulocytes: 0 %
Lymphocytes Relative: 19 %
Lymphs Abs: 1.9 10*3/uL (ref 0.7–4.0)
MCH: 32.3 pg (ref 26.0–34.0)
MCHC: 32.7 g/dL (ref 30.0–36.0)
MCV: 98.7 fL (ref 80.0–100.0)
Monocytes Absolute: 1.2 10*3/uL — ABNORMAL HIGH (ref 0.1–1.0)
Monocytes Relative: 11 %
Neutro Abs: 6.9 10*3/uL (ref 1.7–7.7)
Neutrophils Relative %: 69 %
Platelets: 319 10*3/uL (ref 150–400)
RBC: 3.72 MIL/uL — ABNORMAL LOW (ref 3.87–5.11)
RDW: 13 % (ref 11.5–15.5)
WBC: 10.2 10*3/uL (ref 4.0–10.5)
nRBC: 0 % (ref 0.0–0.2)

## 2018-05-12 MED ORDER — SODIUM CHLORIDE 0.9% FLUSH
10.0000 mL | INTRAVENOUS | Status: DC | PRN
  Administered 2018-05-12: 10 mL
  Filled 2018-05-12: qty 10

## 2018-05-12 MED ORDER — SODIUM CHLORIDE 0.9 % IV SOLN
Freq: Once | INTRAVENOUS | Status: AC
  Administered 2018-05-12: 09:00:00 via INTRAVENOUS
  Filled 2018-05-12: qty 250

## 2018-05-12 MED ORDER — SODIUM CHLORIDE 0.9 % IJ SOLN: 10.0000 mL | INTRAMUSCULAR | Status: DC | PRN

## 2018-05-12 MED ORDER — HEPARIN SOD (PORK) LOCK FLUSH 100 UNIT/ML IV SOLN
500.0000 [IU] | Freq: Once | INTRAVENOUS | Status: AC | PRN
  Administered 2018-05-12: 11:00:00 500 [IU]
  Filled 2018-05-12: qty 5

## 2018-05-12 MED ORDER — SODIUM CHLORIDE 0.9 % IV SOLN
500.0000 mg/m2 | Freq: Once | INTRAVENOUS | Status: AC
  Administered 2018-05-12: 800 mg via INTRAVENOUS
  Filled 2018-05-12: qty 20

## 2018-05-12 MED ORDER — ONDANSETRON HCL 8 MG PO TABS
8.0000 mg | ORAL_TABLET | Freq: Once | ORAL | Status: AC
  Administered 2018-05-12: 8 mg via ORAL

## 2018-05-12 MED ORDER — CYANOCOBALAMIN 1000 MCG/ML IJ SOLN
1000.0000 ug | Freq: Once | INTRAMUSCULAR | Status: AC
  Administered 2018-05-12: 10:00:00 1000 ug via INTRAMUSCULAR

## 2018-05-12 MED ORDER — DEXAMETHASONE SODIUM PHOSPHATE 10 MG/ML IJ SOLN
10.0000 mg | Freq: Once | INTRAMUSCULAR | Status: AC
  Administered 2018-05-12: 10 mg via INTRAVENOUS

## 2018-05-12 MED ORDER — DEXAMETHASONE SODIUM PHOSPHATE 10 MG/ML IJ SOLN
INTRAMUSCULAR | Status: AC
  Filled 2018-05-12: qty 1

## 2018-05-12 MED ORDER — ONDANSETRON HCL 8 MG PO TABS
ORAL_TABLET | ORAL | Status: AC
  Filled 2018-05-12: qty 1

## 2018-05-12 MED ORDER — SODIUM CHLORIDE 0.9% FLUSH
10.0000 mL | INTRAVENOUS | Status: DC | PRN
  Administered 2018-05-12: 10 mL via INTRAVENOUS
  Filled 2018-05-12: qty 10

## 2018-05-12 MED ORDER — CYANOCOBALAMIN 1000 MCG/ML IJ SOLN
INTRAMUSCULAR | Status: AC
  Filled 2018-05-12: qty 1

## 2018-05-12 NOTE — Telephone Encounter (Signed)
Scheduled appt per 5/5 los = added additional cycles . Pt to get an updated schedule next visit.

## 2018-05-12 NOTE — Progress Notes (Signed)
[place] Telephone:(336) [phone]   Fax:(336) [phone]  OFFICE PROGRESS NOTE  Danielle Sanders, MD [place] [address]  DIAGNOSIS: Metastatic non-small cell lung cancer initially diagnosed as locally advanced stage IIIB with a right Pancoast tumor involving the vertebral body as well as prominent canal invasion with spinal cord compression in October 2007. The patient also has metastatic disease to the brain in April 2008.  PRIOR THERAPY: 1. Status post concurrent chemoradiation with weekly carboplatin and paclitaxel, last dose was given November 18, 2005. 2. 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 3. Status post gamma knife radiotherapy to a solitary brain lesion located in the superior frontal area of the brain at [facility] in April of 2008. 4. Status post palliative radiotherapy to the lateral abdominal wall metastatic lesion under the care of Dr. Lisbeth Renshaw, completed March of 2009. 5. Status post 6 cycles of systemic chemotherapy with carboplatin and Alimta. Last dose was given July 26, 2007 with disease stabilization. 6. Gamma knife stereotactic radiotherapy to 2 brain lesions one involving the right frontal dural based as well as right parietal lesion performed on 05/07/2012 under the care of Dr. Vallarie Mare at [facility].  CURRENT THERAPY: Maintenance systemic chemotherapy with Alimta 500 MG/M2 every 3 weeks, status post 185 cycles.  INTERVAL HISTORY: Danielle [name] 68 y.o. female returns to the clinic today for follow-up visit.  The patient is feeling fine today with no concerning complaints.  She denied having any chest pain, shortness of breath, cough or hemoptysis.  She denied having any fever or chills.  She has no nausea, vomiting, diarrhea or constipation.  She has no headache or visual changes.  She continues to tolerate her treatment with Alimta  fairly well.  She had MRI of the brain at [facility] performed recently that showed no concerning findings for disease progression.  The patient is here today for evaluation before starting cycle #186.   MEDICAL HISTORY: Past Medical History:  Diagnosis Date  . Anemia   . 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 CVA (cerebral vascular accident) ([place]) 04/2014   pt states had 2 cva's within 2 wks  . DJD (degenerative joint disease), cervical   . Fibromyalgia   . H/O: pneumonia   . History of tobacco abuse quit 10/08   on nicotine patch  . Hypertension   . Hypokalemia   . Lung cancer ([place]) dx'd 09/2005   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 had chemo and radiation for lung ca  . Thrush 12/11/2010    ALLERGIES:  is allergic to contrast media [iodinated diagnostic agents]; iohexol; sulfa drugs cross reactors; and co-trimoxazole injection [sulfamethoxazole-trimethoprim].  MEDICATIONS:  Current Outpatient Medications  Medication Sig Dispense Refill  . amLODipine (NORVASC) 10 MG tablet TAKE 1 TABLET (10 MG TOTAL) BY MOUTH DAILY. 90 tablet 1  . aspirin 325 MG EC tablet TAKE 1 TABLET BY MOUTH EVERY DAY 90 tablet 0  . atorvastatin (LIPITOR) 40 MG tablet TAKE 1 TABLET BY MOUTH EVERY DAY AT 6 PM 90 tablet 1  . diphenhydrAMINE (BENADRYL) 25 MG tablet Take 1 tablet (25 mg total) by mouth as directed. 2 hours before CT scan. (Patient not taking: Reported on 01/27/2018) 5 tablet 0  . FeFum-FePoly-FA-B Cmp-C-Biot (INTEGRA PLUS) CAPS Take 1 capsule by mouth every morning. 90 capsule 3  . fluticasone (FLONASE) 50 MCG/ACT nasal spray Place 1-2  sprays into both nostrils daily as needed. 16 g 5  . folic acid (FOLVITE) 1 MG tablet Take 1 tablet (1 mg total) by mouth daily. 90 tablet 0  . furosemide (LASIX) 20 MG tablet TAKE 1 TABLET BY MOUTH EVERY DAY AS NEEDED FOR SWELLING 20 tablet 1  . lacosamide (VIMPAT) 50 MG TABS tablet Take by mouth.    . lidocaine-prilocaine (EMLA)  cream APPLY TO PORT-A-CATH AS DIRECTED 1-2 HOURS PRIOR TO CHEMOTHERAPY 30 g 1  . lisinopril (PRINIVIL,ZESTRIL) 20 MG tablet TAKE 2 TABLETS (40 MG TOTAL) BY MOUTH DAILY. 180 tablet 3  . morphine (MS CONTIN) 60 MG 12 hr tablet Take 1 tablet (60 mg total) by mouth 2 (two) times daily. 60 tablet 0  . morphine (MSIR) 30 MG tablet Take 1 tablet (30 mg total) by mouth every 4 (four) hours as needed (breakthrough pain). 60 tablet 0  . Olopatadine HCl 0.2 % SOLN INSTILL 1 DROP INTO AFFECTED EYE(S) EVERY DAY 2.5 mL 1  . ondansetron (ZOFRAN) 8 MG tablet TAKE 1 TABLET BY MOUTH EVERY 8 HOURS AS NEEDED FOR FOR NAUSEA OR VOMITING 30 tablet 0  . predniSONE (DELTASONE) 50 MG tablet 1 tablet by mouth 13 hours ,7 hours and 1 hour  before CT scan 3 tablet 4  . triamcinolone (KENALOG) 0.025 % ointment Apply 1 application topically 2 (two) times daily. 30 g 0   No current facility-administered medications for this visit.    Facility-Administered Medications Ordered in Other Visits  Medication Dose Route Frequency Provider Last Rate Last Dose  . sodium chloride flush (NS) 0.9 % injection 10 mL  10 mL Intravenous PRN Curt Bears, MD   10 mL at 05/12/18 0840    SURGICAL HISTORY:  Past Surgical History:  Procedure Laterality Date  . CERVICAL LAMINECTOMY  1995  . KNEE SURGERY  1990   Left x 2  . 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 LOOP RECORDER IMPLANT N/A 04/19/2014   Procedure: LOOP RECORDER IMPLANT;  Surgeon: Thompson Grayer, MD;  Location[facility] CATH LAB;  Service: Cardiovascular;  Laterality: N/A;  . OTHER SURGICAL HISTORY  2008   Gamma knife surgery to remove brain met   . PORTACATH PLACEMENT  -[name]   TIP IN CAVOATRIAL JUNCTION  . TEE WITHOUT CARDIOVERSION N/A 04/19/2014   Procedure: TRANSESOPHAGEAL ECHOCARDIOGRAM (TEE);  Surgeon: Larey Dresser, MD;  Location: [facility] ENDOSCOPY;  Service: Cardiovascular;  Laterality: N/A;    REVIEW OF SYSTEMS:  A comprehensive review of  systems was negative.   PHYSICAL EXAMINATION: General appearance: alert, cooperative and no distress Head: Normocephalic, without obvious abnormality, atraumatic Neck: no adenopathy, no JVD, supple, symmetrical, trachea midline and thyroid not enlarged, symmetric, no tenderness/mass/nodules Lymph nodes: Cervical, supraclavicular, and axillary nodes normal. Resp: clear to auscultation bilaterally Back: symmetric, no curvature. ROM normal. No CVA tenderness. Cardio: regular rate and rhythm, S1, S2 normal, no murmur, click, rub or gallop GI: soft, non-tender; bowel sounds normal; no masses,  no organomegaly Extremities: extremities normal, atraumatic, no cyanosis or edema  ECOG PERFORMANCE STATUS: 1 - Symptomatic but completely ambulatory  Blood pressure (!) 164/61, pulse 88, temperature 97.6 F (36.4 C), temperature source Oral, resp. rate 20, height 5' 2" (1.575 m), weight 130 lb 8 oz (59.2 kg), SpO2 97 %.  LABORATORY DATA: Lab Results  Component Value Date   WBC 9.7 04/21/2018   HGB 12.3 04/21/2018   HCT 37.6 04/21/2018   MCV 100.0  04/21/2018   PLT 337 04/21/2018      Chemistry      Component Value Date/Time   NA 141 04/21/2018 0825   NA 142 12/24/2016 0816   K 4.0 04/21/2018 0825   K 4.4 12/24/2016 0816   CL 107 04/21/2018 0825   CL 106 06/30/2012 1004   CO2 23 04/21/2018 0825   CO2 28 12/24/2016 0816   BUN 12 04/21/2018 0825   BUN 15.8 12/24/2016 0816   CREATININE 0.71 04/21/2018 0825   CREATININE 0.87 07/22/2017 0826   CREATININE 1.1 12/24/2016 0816      Component Value Date/Time   CALCIUM 9.3 04/21/2018 0825   CALCIUM 9.6 12/24/2016 0816   ALKPHOS 73 04/21/2018 0825   ALKPHOS 80 12/24/2016 0816   AST 22 04/21/2018 0825   AST 20 07/22/2017 0826   AST 20 12/24/2016 0816   ALT 15 04/21/2018 0825   ALT 11 07/22/2017 0826   ALT 11 12/24/2016 0816   BILITOT 0.4 04/21/2018 0825   BILITOT 0.4 07/22/2017 0826   BILITOT 0.34 12/24/2016 0816       RADIOGRAPHIC  STUDIES: No results found.  ASSESSMENT AND PLAN: This is a very pleasant 68 years old white female with metastatic non-small cell lung cancer diagnosed in October 2007 status post concurrent chemoradiation followed by consolidation chemotherapy and she is currently on maintenance treatment with single agent Alimta status post 185 cycles. The patient has been tolerating her treatment well with no concerning complaints. I recommended for her to proceed with cycle 186 today as scheduled. She will come back for follow-up visit in 3 weeks for evaluation before the next cycle of her treatment. For hypertension, she will continue with her current blood pressure medication and monitor it closely at home. The patient was advised to call immediately if she has any concerning symptoms in the interval. The patient voices understanding of current disease status and treatment options and is in agreement with the current care plan. All questions were answered. The patient knows to call the clinic with any problems, questions or concerns. We can certainly see the patient much sooner if necessary.  Disclaimer: This note was dictated with voice recognition software. Similar sounding words can inadvertently be transcribed and may not be corrected upon review.

## 2018-05-12 NOTE — Patient Instructions (Signed)
[facility] Discharge Instructions for Patients Receiving Chemotherapy  Today you received the following chemotherapy agents alimta. To help prevent nausea and vomiting after your treatment, we encourage you to take your nausea medication as directed.    If you develop nausea and vomiting that is not controlled by your nausea medication, call the clinic.   BELOW ARE SYMPTOMS THAT SHOULD BE REPORTED IMMEDIATELY:  *FEVER GREATER THAN 100.5 F  *CHILLS WITH OR WITHOUT FEVER  NAUSEA AND VOMITING THAT IS NOT CONTROLLED WITH YOUR NAUSEA MEDICATION  *UNUSUAL SHORTNESS OF BREATH  *UNUSUAL BRUISING OR BLEEDING  TENDERNESS IN MOUTH AND THROAT WITH OR WITHOUT PRESENCE OF ULCERS  *URINARY PROBLEMS  *BOWEL PROBLEMS  UNUSUAL RASH Items with * indicate a potential emergency and should be followed up as soon as possible.  Feel free to call the clinic you have any questions or concerns. The clinic phone number is [phone].  Please show the [facility] at check-in to the Emergency Department and triage nurse.   Coronavirus (COVID-19) Are you at risk?  Are you at risk for the Coronavirus (COVID-19)?  To be considered HIGH RISK for Coronavirus (COVID-19), you have to meet the following criteria:  . Traveled to [place], [place], [place], [place] or [place]; or in the [place] to [place], [place], [place], or [place]; and have fever, cough, and shortness of breath within the last 2 weeks of travel OR . Been in close contact with a person diagnosed with COVID-19 within the last 2 weeks and have fever, cough, and shortness of breath . IF YOU DO NOT MEET THESE CRITERIA, YOU ARE CONSIDERED LOW RISK FOR COVID-19.  What to do if you are HIGH RISK for COVID-19?  Marland Kitchen If you are having a medical emergency, call 911. . Seek medical care right away. Before you go to a doctor's office, urgent care or emergency department, call ahead and tell them about your recent  travel, contact with someone diagnosed with COVID-19, and your symptoms. You should receive instructions from your physician's office regarding next steps of care.  . When you arrive at healthcare provider, tell the healthcare staff immediately you have returned from visiting [place], [place], [place], [place] or [place]; or traveled in the [place] to [place], [place], [place], or [place]; in the last two weeks or you have been in close contact with a person diagnosed with COVID-19 in the last 2 weeks.   . Tell the health care staff about your symptoms: fever, cough and shortness of breath. . After you have been seen by a medical provider, you will be either: o Tested for (COVID-19) and discharged home on quarantine except to seek medical care if symptoms worsen, and asked to  - Stay home and avoid contact with others until you get your results (4-5 days)  - Avoid travel on public transportation if possible (such as bus, train, or airplane) or o Sent to the Emergency Department by EMS for evaluation, COVID-19 testing, and possible admission depending on your condition and test results.  What to do if you are LOW RISK for COVID-19?  Reduce your risk of any infection by using the same precautions used for avoiding the common cold or flu:  Marland Kitchen Wash your hands often with soap and warm water for at least 20 seconds.  If soap and water are not readily available, use an alcohol-based hand sanitizer with at least 60% alcohol.  . If coughing or sneezing,  cover your mouth and nose by coughing or sneezing into the elbow areas of your shirt or coat, into a tissue or into your sleeve (not your hands). . Avoid shaking hands with others and consider head nods or verbal greetings only. . Avoid touching your eyes, nose, or mouth with unwashed hands.  . Avoid close contact with people who are sick. . Avoid places or events with large numbers of people in one location, like concerts or sporting  events. . Carefully consider travel plans you have or are making. . If you are planning any travel outside or inside the [place], visit the CDC's Travelers' Health webpage for the latest health notices. . If you have some symptoms but not all symptoms, continue to monitor at home and seek medical attention if your symptoms worsen. . If you are having a medical emergency, call 911.   [place] / e-Visit: [email or web address]         [facility]: [facility]: [phone]                   [facility]: [phone]

## 2018-05-13 ENCOUNTER — Other Ambulatory Visit: Payer: Self-pay | Admitting: Family Medicine

## 2018-05-15 ENCOUNTER — Other Ambulatory Visit: Payer: Self-pay | Admitting: Internal Medicine

## 2018-05-15 ENCOUNTER — Telehealth: Payer: Self-pay | Admitting: *Deleted

## 2018-05-15 DIAGNOSIS — C3490 Malignant neoplasm of unspecified part of unspecified bronchus or lung: Secondary | ICD-10-CM

## 2018-05-15 DIAGNOSIS — C3410 Malignant neoplasm of upper lobe, unspecified bronchus or lung: Secondary | ICD-10-CM

## 2018-05-15 MED ORDER — MORPHINE SULFATE ER 60 MG PO TBCR: 60.0000 mg | EXTENDED_RELEASE_TABLET | Freq: Two times a day (BID) | ORAL | 0 refills | Status: DC

## 2018-05-15 NOTE — Telephone Encounter (Signed)
Received vm message from patient requesting refill of her MS Contin 60 mg. Last filled on 04/16/18 for # 60.  Her pharmacy is [facility] in [place], [place]

## 2018-05-16 ENCOUNTER — Encounter

## 2018-06-02 ENCOUNTER — Other Ambulatory Visit: Payer: Self-pay

## 2018-06-02 ENCOUNTER — Inpatient Hospital Stay: Payer: Medicare Other

## 2018-06-02 ENCOUNTER — Encounter: Payer: Self-pay | Admitting: Internal Medicine

## 2018-06-02 ENCOUNTER — Inpatient Hospital Stay (HOSPITAL_BASED_OUTPATIENT_CLINIC_OR_DEPARTMENT_OTHER): Payer: Medicare Other | Admitting: Internal Medicine

## 2018-06-02 VITALS — BP 137/60 | HR 81 | Temp 98.5°F | Resp 20 | Ht 62.0 in | Wt 136.5 lb

## 2018-06-02 DIAGNOSIS — Z87891 Personal history of nicotine dependence: Secondary | ICD-10-CM | POA: Diagnosis not present

## 2018-06-02 DIAGNOSIS — Z7951 Long term (current) use of inhaled steroids: Secondary | ICD-10-CM

## 2018-06-02 DIAGNOSIS — Z9221 Personal history of antineoplastic chemotherapy: Secondary | ICD-10-CM | POA: Diagnosis not present

## 2018-06-02 DIAGNOSIS — Z923 Personal history of irradiation: Secondary | ICD-10-CM

## 2018-06-02 DIAGNOSIS — Z5111 Encounter for antineoplastic chemotherapy: Secondary | ICD-10-CM

## 2018-06-02 DIAGNOSIS — Z7982 Long term (current) use of aspirin: Secondary | ICD-10-CM | POA: Diagnosis not present

## 2018-06-02 DIAGNOSIS — C3490 Malignant neoplasm of unspecified part of unspecified bronchus or lung: Secondary | ICD-10-CM

## 2018-06-02 DIAGNOSIS — C7931 Secondary malignant neoplasm of brain: Secondary | ICD-10-CM | POA: Diagnosis not present

## 2018-06-02 DIAGNOSIS — C3411 Malignant neoplasm of upper lobe, right bronchus or lung: Secondary | ICD-10-CM

## 2018-06-02 DIAGNOSIS — Z8673 Personal history of transient ischemic attack (TIA), and cerebral infarction without residual deficits: Secondary | ICD-10-CM

## 2018-06-02 DIAGNOSIS — Z95828 Presence of other vascular implants and grafts: Secondary | ICD-10-CM

## 2018-06-02 DIAGNOSIS — I10 Essential (primary) hypertension: Secondary | ICD-10-CM | POA: Diagnosis not present

## 2018-06-02 DIAGNOSIS — Z79899 Other long term (current) drug therapy: Secondary | ICD-10-CM

## 2018-06-02 LAB — CBC WITH DIFFERENTIAL/PLATELET
Abs Immature Granulocytes: 0.03 10*3/uL (ref 0.00–0.07)
Basophils Absolute: 0 10*3/uL (ref 0.0–0.1)
Basophils Relative: 0 %
Eosinophils Absolute: 0.1 10*3/uL (ref 0.0–0.5)
Eosinophils Relative: 1 %
HCT: 37.4 % (ref 36.0–46.0)
Hemoglobin: 11.9 g/dL — ABNORMAL LOW (ref 12.0–15.0)
Immature Granulocytes: 0 %
Lymphocytes Relative: 20 %
Lymphs Abs: 2 10*3/uL (ref 0.7–4.0)
MCH: 32.1 pg (ref 26.0–34.0)
MCHC: 31.8 g/dL (ref 30.0–36.0)
MCV: 100.8 fL — ABNORMAL HIGH (ref 80.0–100.0)
Monocytes Absolute: 1.2 10*3/uL — ABNORMAL HIGH (ref 0.1–1.0)
Monocytes Relative: 12 %
Neutro Abs: 6.6 10*3/uL (ref 1.7–7.7)
Neutrophils Relative %: 67 %
Platelets: 310 10*3/uL (ref 150–400)
RBC: 3.71 MIL/uL — ABNORMAL LOW (ref 3.87–5.11)
RDW: 13.3 % (ref 11.5–15.5)
WBC: 10 10*3/uL (ref 4.0–10.5)
nRBC: 0 % (ref 0.0–0.2)

## 2018-06-02 LAB — COMPREHENSIVE METABOLIC PANEL
ALT: 19 U/L (ref 0–44)
AST: 23 U/L (ref 15–41)
Albumin: 3.3 g/dL — ABNORMAL LOW (ref 3.5–5.0)
Alkaline Phosphatase: 70 U/L (ref 38–126)
Anion gap: 7 (ref 5–15)
BUN: 22 mg/dL (ref 8–23)
CO2: 27 mmol/L (ref 22–32)
Calcium: 9.1 mg/dL (ref 8.9–10.3)
Chloride: 106 mmol/L (ref 98–111)
Creatinine, Ser: 0.79 mg/dL (ref 0.44–1.00)
GFR calc Af Amer: >60 mL/min (ref >60)
GFR calc non Af Amer: >60 mL/min (ref >60)
Glucose, Bld: 118 mg/dL — ABNORMAL HIGH (ref 70–99)
Potassium: 4.1 mmol/L (ref 3.5–5.1)
Sodium: 140 mmol/L (ref 135–145)
Total Bilirubin: 0.2 mg/dL — ABNORMAL LOW (ref 0.3–1.2)
Total Protein: 6.6 g/dL (ref 6.5–8.1)

## 2018-06-02 MED ORDER — ONDANSETRON HCL 8 MG PO TABS
8.0000 mg | ORAL_TABLET | Freq: Once | ORAL | Status: AC
  Administered 2018-06-02: 8 mg via ORAL

## 2018-06-02 MED ORDER — ONDANSETRON HCL 8 MG PO TABS
ORAL_TABLET | ORAL | Status: AC
  Filled 2018-06-02: qty 1

## 2018-06-02 MED ORDER — SODIUM CHLORIDE 0.9 % IV SOLN
Freq: Once | INTRAVENOUS | Status: AC
  Administered 2018-06-02: 11:00:00 via INTRAVENOUS
  Filled 2018-06-02: qty 250

## 2018-06-02 MED ORDER — DEXAMETHASONE SODIUM PHOSPHATE 10 MG/ML IJ SOLN
INTRAMUSCULAR | Status: AC
  Filled 2018-06-02: qty 1

## 2018-06-02 MED ORDER — SODIUM CHLORIDE 0.9 % IV SOLN
500.0000 mg/m2 | Freq: Once | INTRAVENOUS | Status: AC
  Administered 2018-06-02: 12:00:00 800 mg via INTRAVENOUS
  Filled 2018-06-02: qty 20

## 2018-06-02 MED ORDER — SODIUM CHLORIDE 0.9% FLUSH
10.0000 mL | INTRAVENOUS | Status: DC | PRN
  Administered 2018-06-02: 10 mL
  Filled 2018-06-02: qty 10

## 2018-06-02 MED ORDER — DEXAMETHASONE SODIUM PHOSPHATE 10 MG/ML IJ SOLN
10.0000 mg | Freq: Once | INTRAMUSCULAR | Status: AC
  Administered 2018-06-02: 11:00:00 10 mg via INTRAVENOUS

## 2018-06-02 MED ORDER — SODIUM CHLORIDE 0.9% FLUSH
10.0000 mL | INTRAVENOUS | Status: DC | PRN
  Administered 2018-06-02: 10 mL via INTRAVENOUS
  Filled 2018-06-02: qty 10

## 2018-06-02 MED ORDER — HEPARIN SOD (PORK) LOCK FLUSH 100 UNIT/ML IV SOLN
500.0000 [IU] | Freq: Once | INTRAVENOUS | Status: AC | PRN
  Administered 2018-06-02: 12:00:00 500 [IU]
  Filled 2018-06-02: qty 5

## 2018-06-02 NOTE — Patient Instructions (Signed)

## 2018-06-02 NOTE — Patient Instructions (Signed)
[facility] Discharge Instructions for Patients Receiving Chemotherapy  Today you received the following chemotherapy agents Alimta  To help prevent nausea and vomiting after your treatment, we encourage you to take your nausea medication as directed by MD   If you develop nausea and vomiting that is not controlled by your nausea medication, call the clinic.   BELOW ARE SYMPTOMS THAT SHOULD BE REPORTED IMMEDIATELY:  *FEVER GREATER THAN 100.5 F  *CHILLS WITH OR WITHOUT FEVER  NAUSEA AND VOMITING THAT IS NOT CONTROLLED WITH YOUR NAUSEA MEDICATION  *UNUSUAL SHORTNESS OF BREATH  *UNUSUAL BRUISING OR BLEEDING  TENDERNESS IN MOUTH AND THROAT WITH OR WITHOUT PRESENCE OF ULCERS  *URINARY PROBLEMS  *BOWEL PROBLEMS  UNUSUAL RASH Items with * indicate a potential emergency and should be followed up as soon as possible.  Feel free to call the clinic should you have any questions or concerns. The clinic phone number is [phone].  Please show the [facility] at check-in to the Emergency Department and triage nurse.  Coronavirus (COVID-19) Are you at risk?  Are you at risk for the Coronavirus (COVID-19)?  To be considered HIGH RISK for Coronavirus (COVID-19), you have to meet the following criteria:  . Traveled to [place], [place], [place], [place] or [place]; or in the [place] to [place], [place], [place], or [place]; and have fever, cough, and shortness of breath within the last 2 weeks of travel OR . Been in close contact with a person diagnosed with COVID-19 within the last 2 weeks and have fever, cough, and shortness of breath . IF YOU DO NOT MEET THESE CRITERIA, YOU ARE CONSIDERED LOW RISK FOR COVID-19.  What to do if you are HIGH RISK for COVID-19?  Marland Kitchen If you are having a medical emergency, call 911. . Seek medical care right away. Before you go to a doctor's office, urgent care or emergency department, call ahead and tell them about your  recent travel, contact with someone diagnosed with COVID-19, and your symptoms. You should receive instructions from your physician's office regarding next steps of care.  . When you arrive at healthcare provider, tell the healthcare staff immediately you have returned from visiting [place], [place], [place], [place] or [place]; or traveled in the [place] to [place], [place], [place], or [place]; in the last two weeks or you have been in close contact with a person diagnosed with COVID-19 in the last 2 weeks.   . Tell the health care staff about your symptoms: fever, cough and shortness of breath. . After you have been seen by a medical provider, you will be either: o Tested for (COVID-19) and discharged home on quarantine except to seek medical care if symptoms worsen, and asked to  - Stay home and avoid contact with others until you get your results (4-5 days)  - Avoid travel on public transportation if possible (such as bus, train, or airplane) or o Sent to the Emergency Department by EMS for evaluation, COVID-19 testing, and possible admission depending on your condition and test results.  What to do if you are LOW RISK for COVID-19?  Reduce your risk of any infection by using the same precautions used for avoiding the common cold or flu:  Marland Kitchen Wash your hands often with soap and warm water for at least 20 seconds.  If soap and water are not readily available, use an alcohol-based hand sanitizer with at least 60% alcohol.  . If coughing  or sneezing, cover your mouth and nose by coughing or sneezing into the elbow areas of your shirt or coat, into a tissue or into your sleeve (not your hands). . Avoid shaking hands with others and consider head nods or verbal greetings only. . Avoid touching your eyes, nose, or mouth with unwashed hands.  . Avoid close contact with people who are sick. . Avoid places or events with large numbers of people in one location, like concerts or sporting  events. . Carefully consider travel plans you have or are making. . If you are planning any travel outside or inside the [place], visit the CDC's Travelers' Health webpage for the latest health notices. . If you have some symptoms but not all symptoms, continue to monitor at home and seek medical attention if your symptoms worsen. . If you are having a medical emergency, call 911.   [facility] / e-Visit: [email or web address]         [facility]: [facility]: [phone]                   [facility]: [phone]

## 2018-06-02 NOTE — Progress Notes (Signed)
[facility] [phone]   [phone]  OFFICE PROGRESS NOTE  [name], MD [place] [address]  DIAGNOSIS: Metastatic non-small cell lung cancer initially diagnosed as locally advanced stage IIIB with a right Pancoast tumor involving the vertebral body as well as prominent canal invasion with spinal cord compression in October 2007. The patient also has metastatic disease to the brain in April 2008.  PRIOR THERAPY: 1. Status post concurrent chemoradiation with weekly carboplatin and paclitaxel, last dose was given November 18, 2005. 2. 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 3. Status post gamma knife radiotherapy to a solitary brain lesion located in the superior frontal area of the brain at [facility] in April of 2008. 4. Status post palliative radiotherapy to the lateral abdominal wall metastatic lesion under the care of Dr. Lisbeth Renshaw, completed March of 2009. 5. Status post 6 cycles of systemic chemotherapy with carboplatin and Alimta. Last dose was given July 26, 2007 with disease stabilization. 6. Gamma knife stereotactic radiotherapy to 2 brain lesions one involving the right frontal dural based as well as right parietal lesion performed on 05/07/2012 under the care of Dr. Vallarie Mare at [facility].  CURRENT THERAPY: Maintenance systemic chemotherapy with Alimta 500 MG/M2 every 3 weeks, status post 186 cycles.  INTERVAL HISTORY: Danielle [name] 68 y.o. female returns to the clinic today for follow-up visit.  The patient is feeling fine today with no concerning complaints except for mild fatigue.  She denied having any chest pain, shortness of breath, cough or hemoptysis.  She has no fever or chills.  She continues to have mild nausea but no vomiting, diarrhea or constipation.  The patient remained around 5 pounds since her last visit.   She is here today for evaluation before starting cycle #187.   MEDICAL HISTORY: Past Medical History:  Diagnosis Date  . Anemia   . 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 CVA (cerebral vascular accident) ([facility]) 04/2014   pt states had 2 cva's within 2 wks  . DJD (degenerative joint disease), cervical   . Fibromyalgia   . H/O: pneumonia   . History of tobacco abuse quit 10/08   on nicotine patch  . Hypertension   . Hypokalemia   . Lung cancer ([facility]) dx'd 09/2005   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 had chemo and radiation for lung ca  . Thrush 12/11/2010    ALLERGIES:  is allergic to contrast media [iodinated diagnostic agents]; iohexol; sulfa drugs cross reactors; and co-trimoxazole injection [sulfamethoxazole-trimethoprim].  MEDICATIONS:  Current Outpatient Medications  Medication Sig Dispense Refill  . amLODipine (NORVASC) 10 MG tablet TAKE 1 TABLET (10 MG TOTAL) BY MOUTH DAILY. 90 tablet 1  . aspirin 325 MG EC tablet TAKE 1 TABLET BY MOUTH EVERY DAY 90 tablet 0  . atorvastatin (LIPITOR) 40 MG tablet TAKE 1 TABLET BY MOUTH EVERY DAY AT 6 PM 90 tablet 1  . diphenhydrAMINE (BENADRYL) 25 MG tablet Take 1 tablet (25 mg total) by mouth as directed. 2 hours before CT scan. (Patient not taking: Reported on 01/27/2018) 5 tablet 0  . FeFum-FePoly-FA-B Cmp-C-Biot (INTEGRA PLUS) CAPS Take 1 capsule by mouth every morning. 90 capsule 3  . fluticasone (FLONASE) 50 MCG/ACT nasal spray Place 1-2 sprays into both nostrils daily as needed. 16 g 5  . folic acid (FOLVITE) 1 MG tablet Take 1 tablet (1 mg  total) by mouth daily. 90 tablet 0  . furosemide (LASIX) 20 MG tablet TAKE 1 TABLET BY MOUTH EVERY DAY AS NEEDED FOR SWELLING 20 tablet 1  . lacosamide (VIMPAT) 50 MG TABS tablet Take by mouth.    . lidocaine-prilocaine (EMLA) cream APPLY TO PORT-A-CATH AS DIRECTED 1-2 HOURS PRIOR TO CHEMOTHERAPY 30 g 1  . lisinopril (PRINIVIL,ZESTRIL) 20 MG tablet TAKE 2 TABLETS (40 MG TOTAL)  BY MOUTH DAILY. 180 tablet 3  . morphine (MS CONTIN) 60 MG 12 hr tablet Take 1 tablet (60 mg total) by mouth 2 (two) times daily. 60 tablet 0  . morphine (MSIR) 30 MG tablet Take 1 tablet (30 mg total) by mouth every 4 (four) hours as needed (breakthrough pain). 60 tablet 0  . Olopatadine HCl 0.2 % SOLN INSTILL 1 DROP INTO AFFECTED EYE(S) EVERY DAY 2.5 mL 1  . ondansetron (ZOFRAN) 8 MG tablet TAKE 1 TABLET BY MOUTH EVERY 8 HOURS AS NEEDED FOR FOR NAUSEA OR VOMITING 30 tablet 0  . predniSONE (DELTASONE) 50 MG tablet 1 tablet by mouth 13 hours ,7 hours and 1 hour  before CT scan 3 tablet 4  . triamcinolone (KENALOG) 0.025 % ointment Apply 1 application topically 2 (two) times daily. 30 g 0   No current facility-administered medications for this visit.     SURGICAL HISTORY:  Past Surgical History:  Procedure Laterality Date  . CERVICAL LAMINECTOMY  1995  . KNEE SURGERY  1990   Left x 2  . 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 LOOP RECORDER IMPLANT N/A 04/19/2014   Procedure: LOOP RECORDER IMPLANT;  Surgeon: Thompson Grayer, MD;  Location: [facility] CATH LAB;  Service: Cardiovascular;  Laterality: N/A;  . OTHER SURGICAL HISTORY  2008   Gamma knife surgery to remove brain met   . PORTACATH PLACEMENT  -[name]   TIP IN CAVOATRIAL JUNCTION  . TEE WITHOUT CARDIOVERSION N/A 04/19/2014   Procedure: TRANSESOPHAGEAL ECHOCARDIOGRAM (TEE);  Surgeon: Larey Dresser, MD;  Location: [facility];  Service: Cardiovascular;  Laterality: N/A;    REVIEW OF SYSTEMS:  A comprehensive review of systems was negative except for: Constitutional: positive for fatigue Gastrointestinal: positive for nausea   PHYSICAL EXAMINATION: General appearance: alert, cooperative and no distress Head: Normocephalic, without obvious abnormality, atraumatic Neck: no adenopathy, no JVD, supple, symmetrical, trachea midline and thyroid not enlarged, symmetric, no tenderness/mass/nodules Lymph nodes:  Cervical, supraclavicular, and axillary nodes normal. Resp: clear to auscultation bilaterally Back: symmetric, no curvature. ROM normal. No CVA tenderness. Cardio: regular rate and rhythm, S1, S2 normal, no murmur, click, rub or gallop GI: soft, non-tender; bowel sounds normal; no masses,  no organomegaly Extremities: extremities normal, atraumatic, no cyanosis or edema  ECOG PERFORMANCE STATUS: 1 - Symptomatic but completely ambulatory  Blood pressure 137/60, pulse 81, temperature 98.5 F (36.9 C), temperature source Oral, resp. rate 20, height _0  (1.575 m), weight 136 lb 8 oz (61.9 kg), SpO2 98 %.  LABORATORY DATA: Lab Results  Component Value Date   WBC 10.0 06/02/2018   HGB 11.9 (L) 06/02/2018   HCT 37.4 06/02/2018   MCV 100.8 (H) 06/02/2018   PLT 310 06/02/2018      Chemistry      Component Value Date/Time   NA 141 05/12/2018 0845   NA 142 12/24/2016 0816   K 4.1 05/12/2018 0845   K 4.4 12/24/2016 0816   CL 105 05/12/2018 0845   CL 106 06/30/2012 1004  CO2 27 05/12/2018 0845   CO2 28 12/24/2016 0816   BUN 13 05/12/2018 0845   BUN 15.8 12/24/2016 0816   CREATININE 0.79 05/12/2018 0845   CREATININE 0.87 07/22/2017 0826   CREATININE 1.1 12/24/2016 0816      Component Value Date/Time   CALCIUM 9.2 05/12/2018 0845   CALCIUM 9.6 12/24/2016 0816   ALKPHOS 67 05/12/2018 0845   ALKPHOS 80 12/24/2016 0816   AST 22 05/12/2018 0845   AST 20 07/22/2017 0826   AST 20 12/24/2016 0816   ALT 15 05/12/2018 0845   ALT 11 07/22/2017 0826   ALT 11 12/24/2016 0816   BILITOT 0.3 05/12/2018 0845   BILITOT 0.4 07/22/2017 0826   BILITOT 0.34 12/24/2016 0816       RADIOGRAPHIC STUDIES: No results found.  ASSESSMENT AND PLAN: This is a very pleasant 68 years old white female with metastatic non-small cell lung cancer diagnosed in October 2007 status post concurrent chemoradiation followed by consolidation chemotherapy and she is currently on maintenance treatment with single  agent Alimta status post 186 cycles. The patient continues to tolerate this treatment. I recommended for her to proceed with cycle 187 today as planned. I will see her back for follow-up visit in 3 weeks for evaluation before starting the next cycle of her treatment. The patient was advised to call immediately if she has any concerning symptoms in the interval. The patient voices understanding of current disease status and treatment options and is in agreement with the current care plan. All questions were answered. The patient knows to call the clinic with any problems, questions or concerns. We can certainly see the patient much sooner if necessary.  Disclaimer: This note was dictated with voice recognition software. Similar sounding words can inadvertently be transcribed and may not be corrected upon review.

## 2018-06-07 ENCOUNTER — Other Ambulatory Visit: Payer: Self-pay | Admitting: Internal Medicine

## 2018-06-07 DIAGNOSIS — C3411 Malignant neoplasm of upper lobe, right bronchus or lung: Secondary | ICD-10-CM

## 2018-06-07 DIAGNOSIS — C7931 Secondary malignant neoplasm of brain: Secondary | ICD-10-CM

## 2018-06-15 ENCOUNTER — Other Ambulatory Visit: Payer: Self-pay | Admitting: Internal Medicine

## 2018-06-15 ENCOUNTER — Telehealth: Payer: Self-pay | Admitting: Medical Oncology

## 2018-06-15 DIAGNOSIS — C3410 Malignant neoplasm of upper lobe, unspecified bronchus or lung: Secondary | ICD-10-CM

## 2018-06-15 DIAGNOSIS — C3490 Malignant neoplasm of unspecified part of unspecified bronchus or lung: Secondary | ICD-10-CM

## 2018-06-15 MED ORDER — MORPHINE SULFATE 30 MG PO TABS: 30.0000 mg | ORAL_TABLET | ORAL | 0 refills | Status: DC | PRN

## 2018-06-15 MED ORDER — MORPHINE SULFATE ER 60 MG PO TBCR: 60.0000 mg | EXTENDED_RELEASE_TABLET | Freq: Two times a day (BID) | ORAL | 0 refills | Status: DC

## 2018-06-15 NOTE — Telephone Encounter (Signed)
Refill pain med x 2

## 2018-06-20 ENCOUNTER — Other Ambulatory Visit: Payer: Self-pay | Admitting: Family Medicine

## 2018-06-23 ENCOUNTER — Inpatient Hospital Stay: Payer: Medicare Other

## 2018-06-23 ENCOUNTER — Inpatient Hospital Stay (HOSPITAL_BASED_OUTPATIENT_CLINIC_OR_DEPARTMENT_OTHER): Payer: Medicare Other | Admitting: Internal Medicine

## 2018-06-23 ENCOUNTER — Encounter: Payer: Self-pay | Admitting: Internal Medicine

## 2018-06-23 ENCOUNTER — Other Ambulatory Visit: Payer: Self-pay

## 2018-06-23 ENCOUNTER — Inpatient Hospital Stay: Payer: Medicare Other | Attending: Internal Medicine

## 2018-06-23 VITALS — BP 140/58 | HR 95 | Temp 98.5°F | Resp 18 | Ht 62.0 in | Wt 143.4 lb

## 2018-06-23 DIAGNOSIS — C7931 Secondary malignant neoplasm of brain: Secondary | ICD-10-CM

## 2018-06-23 DIAGNOSIS — Z5111 Encounter for antineoplastic chemotherapy: Secondary | ICD-10-CM | POA: Diagnosis not present

## 2018-06-23 DIAGNOSIS — C3411 Malignant neoplasm of upper lobe, right bronchus or lung: Secondary | ICD-10-CM | POA: Insufficient documentation

## 2018-06-23 DIAGNOSIS — Z95828 Presence of other vascular implants and grafts: Secondary | ICD-10-CM

## 2018-06-23 DIAGNOSIS — C3490 Malignant neoplasm of unspecified part of unspecified bronchus or lung: Secondary | ICD-10-CM

## 2018-06-23 LAB — COMPREHENSIVE METABOLIC PANEL
ALT: 22 U/L (ref 0–44)
AST: 28 U/L (ref 15–41)
Albumin: 3.5 g/dL (ref 3.5–5.0)
Alkaline Phosphatase: 81 U/L (ref 38–126)
Anion gap: 11 (ref 5–15)
BUN: 17 mg/dL (ref 8–23)
CO2: 23 mmol/L (ref 22–32)
Calcium: 9.3 mg/dL (ref 8.9–10.3)
Chloride: 108 mmol/L (ref 98–111)
Creatinine, Ser: 0.78 mg/dL (ref 0.44–1.00)
GFR calc Af Amer: >60 mL/min (ref >60)
GFR calc non Af Amer: >60 mL/min (ref >60)
Glucose, Bld: 113 mg/dL — ABNORMAL HIGH (ref 70–99)
Potassium: 4.1 mmol/L (ref 3.5–5.1)
Sodium: 142 mmol/L (ref 135–145)
Total Bilirubin: 0.2 mg/dL — ABNORMAL LOW (ref 0.3–1.2)
Total Protein: 6.9 g/dL (ref 6.5–8.1)

## 2018-06-23 LAB — CBC WITH DIFFERENTIAL/PLATELET
Abs Immature Granulocytes: 0.08 10*3/uL — ABNORMAL HIGH (ref 0.00–0.07)
Basophils Absolute: 0.1 10*3/uL (ref 0.0–0.1)
Basophils Relative: 1 %
Eosinophils Absolute: 0.1 10*3/uL (ref 0.0–0.5)
Eosinophils Relative: 1 %
HCT: 37.7 % (ref 36.0–46.0)
Hemoglobin: 12.2 g/dL (ref 12.0–15.0)
Immature Granulocytes: 1 %
Lymphocytes Relative: 19 %
Lymphs Abs: 2.1 10*3/uL (ref 0.7–4.0)
MCH: 32.2 pg (ref 26.0–34.0)
MCHC: 32.4 g/dL (ref 30.0–36.0)
MCV: 99.5 fL (ref 80.0–100.0)
Monocytes Absolute: 1.2 10*3/uL — ABNORMAL HIGH (ref 0.1–1.0)
Monocytes Relative: 11 %
Neutro Abs: 7.5 10*3/uL (ref 1.7–7.7)
Neutrophils Relative %: 67 %
Platelets: 348 10*3/uL (ref 150–400)
RBC: 3.79 MIL/uL — ABNORMAL LOW (ref 3.87–5.11)
RDW: 13.3 % (ref 11.5–15.5)
WBC: 11.1 10*3/uL — ABNORMAL HIGH (ref 4.0–10.5)
nRBC: 0 % (ref 0.0–0.2)

## 2018-06-23 MED ORDER — DEXAMETHASONE SODIUM PHOSPHATE 10 MG/ML IJ SOLN
INTRAMUSCULAR | Status: AC
  Filled 2018-06-23: qty 1

## 2018-06-23 MED ORDER — DEXAMETHASONE SODIUM PHOSPHATE 10 MG/ML IJ SOLN
10.0000 mg | Freq: Once | INTRAMUSCULAR | Status: AC
  Administered 2018-06-23: 10 mg via INTRAVENOUS

## 2018-06-23 MED ORDER — SODIUM CHLORIDE 0.9% FLUSH
10.0000 mL | INTRAVENOUS | Status: DC | PRN
  Administered 2018-06-23: 10 mL via INTRAVENOUS
  Filled 2018-06-23: qty 10

## 2018-06-23 MED ORDER — ONDANSETRON HCL 8 MG PO TABS
8.0000 mg | ORAL_TABLET | Freq: Once | ORAL | Status: AC
  Administered 2018-06-23: 8 mg via ORAL

## 2018-06-23 MED ORDER — SODIUM CHLORIDE 0.9 % IV SOLN
Freq: Once | INTRAVENOUS | Status: AC
  Administered 2018-06-23: 10:00:00 via INTRAVENOUS
  Filled 2018-06-23: qty 250

## 2018-06-23 MED ORDER — ONDANSETRON HCL 8 MG PO TABS
ORAL_TABLET | ORAL | Status: AC
  Filled 2018-06-23: qty 1

## 2018-06-23 MED ORDER — HEPARIN SOD (PORK) LOCK FLUSH 100 UNIT/ML IV SOLN
500.0000 [IU] | Freq: Once | INTRAVENOUS | Status: AC | PRN
  Administered 2018-06-23: 500 [IU]
  Filled 2018-06-23: qty 5

## 2018-06-23 MED ORDER — SODIUM CHLORIDE 0.9 % IV SOLN
500.0000 mg/m2 | Freq: Once | INTRAVENOUS | Status: AC
  Administered 2018-06-23: 800 mg via INTRAVENOUS
  Filled 2018-06-23: qty 12

## 2018-06-23 NOTE — Progress Notes (Signed)
[place] Telephone:(336) [phone]   Fax:(336) [phone]  OFFICE PROGRESS NOTE  [name], MD [place] [address]  DIAGNOSIS: Metastatic non-small cell lung cancer initially diagnosed as locally advanced stage IIIB with a right Pancoast tumor involving the vertebral body as well as prominent canal invasion with spinal cord compression in October 2007. The patient also has metastatic disease to the brain in April 2008.  PRIOR THERAPY: 1. Status post concurrent chemoradiation with weekly carboplatin and paclitaxel, last dose was given November 18, 2005. 2. 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 3. Status post gamma knife radiotherapy to a solitary brain lesion located in the superior frontal area of the brain at [facility] in April of 2008. 4. Status post palliative radiotherapy to the lateral abdominal wall metastatic lesion under the care of Dr. Lisbeth Renshaw, completed March of 2009. 5. Status post 6 cycles of systemic chemotherapy with carboplatin and Alimta. Last dose was given July 26, 2007 with disease stabilization. 6. Gamma knife stereotactic radiotherapy to 2 brain lesions one involving the right frontal dural based as well as right parietal lesion performed on 05/07/2012 under the care of Dr. Vallarie Mare at [facility].  CURRENT THERAPY: Maintenance systemic chemotherapy with Alimta 500 MG/M2 every 3 weeks, status post 187 cycles.  INTERVAL HISTORY: Danielle [name] 67 y.o. female returns to the clinic today for follow-up visit.  The patient is feeling fine today with no concerning complaints.  She gained around 6 pounds since her last visit.  She is staying home and eats more than normal during the [place] pandemic.  She denied having any current chest pain, shortness of breath, cough or hemoptysis.  She denied having any fever or chills.  She has no  concerning nausea, vomiting, diarrhea or constipation.  She is here today for evaluation before starting cycle #188.  MEDICAL HISTORY: Past Medical History:  Diagnosis Date  . Anemia   . 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 CVA (cerebral vascular accident) ([place]) 04/2014   pt states had 2 cva's within 2 wks  . DJD (degenerative joint disease), cervical   . Fibromyalgia   . H/O: pneumonia   . History of tobacco abuse quit 10/08   on nicotine patch  . Hypertension   . Hypokalemia   . Lung cancer ([place]) dx'd 09/2005   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 had chemo and radiation for lung ca  . Thrush 12/11/2010    ALLERGIES:  is allergic to contrast media [iodinated diagnostic agents]; iohexol; sulfa drugs cross reactors; and co-trimoxazole injection [sulfamethoxazole-trimethoprim].  MEDICATIONS:  Current Outpatient Medications  Medication Sig Dispense Refill  . amLODipine (NORVASC) 10 MG tablet TAKE 1 TABLET (10 MG TOTAL) BY MOUTH DAILY. 90 tablet 1  . aspirin 325 MG EC tablet TAKE 1 TABLET BY MOUTH EVERY DAY 90 tablet 0  . atorvastatin (LIPITOR) 40 MG tablet TAKE 1 TABLET BY MOUTH EVERY DAY AT 6 PM 90 tablet 0  . diphenhydrAMINE (BENADRYL) 25 MG tablet Take 1 tablet (25 mg total) by mouth as directed. 2 hours before CT scan. (Patient not taking: Reported on 01/27/2018) 5 tablet 0  . FeFum-FePoly-FA-B Cmp-C-Biot (INTEGRA PLUS) CAPS Take 1 capsule by mouth every morning. 90 capsule 3  . fluticasone (FLONASE) 50 MCG/ACT nasal spray Place 1-2 sprays into both nostrils daily as needed. 16 g 5  . folic acid (FOLVITE) 1  MG tablet Take 1 tablet (1 mg total) by mouth daily. 90 tablet 0  . furosemide (LASIX) 20 MG tablet TAKE 1 TABLET BY MOUTH EVERY DAY AS NEEDED FOR SWELLING 20 tablet 1  . lacosamide (VIMPAT) 50 MG TABS tablet Take by mouth.    . lidocaine-prilocaine (EMLA) cream APPLY TO PORT-A-CATH AS DIRECTED 1-2 HOURS PRIOR TO CHEMOTHERAPY 30 g 1  . lisinopril  (PRINIVIL,ZESTRIL) 20 MG tablet TAKE 2 TABLETS (40 MG TOTAL) BY MOUTH DAILY. 180 tablet 3  . morphine (MS CONTIN) 60 MG 12 hr tablet Take 1 tablet (60 mg total) by mouth 2 (two) times daily. 60 tablet 0  . morphine (MSIR) 30 MG tablet Take 1 tablet (30 mg total) by mouth every 4 (four) hours as needed (breakthrough pain). 60 tablet 0  . Olopatadine HCl 0.2 % SOLN INSTILL 1 DROP INTO AFFECTED EYE(S) EVERY DAY 2.5 mL 1  . ondansetron (ZOFRAN) 8 MG tablet TAKE 1 TABLET BY MOUTH EVERY 8 HOURS AS NEEDED FOR FOR NAUSEA OR VOMITING 30 tablet 0  . predniSONE (DELTASONE) 50 MG tablet 1 tablet by mouth 13 hours ,7 hours and 1 hour  before CT scan 3 tablet 4  . triamcinolone (KENALOG) 0.025 % ointment Apply 1 application topically 2 (two) times daily. 30 g 0   No current facility-administered medications for this visit.     SURGICAL HISTORY:  Past Surgical History:  Procedure Laterality Date  . CERVICAL LAMINECTOMY  1995  . KNEE SURGERY  1990   Left x 2  . 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 LOOP RECORDER IMPLANT N/A 04/19/2014   Procedure: LOOP RECORDER IMPLANT;  Surgeon: Thompson Grayer, MD;  Location: [facility] CATH LAB;  Service: Cardiovascular;  Laterality: N/A;  . OTHER SURGICAL HISTORY  2008   Gamma knife surgery to remove brain met   . PORTACATH PLACEMENT  -[name]   TIP IN CAVOATRIAL JUNCTION  . TEE WITHOUT CARDIOVERSION N/A 04/19/2014   Procedure: TRANSESOPHAGEAL ECHOCARDIOGRAM (TEE);  Surgeon: Larey Dresser, MD;  Location: [place];  Service: Cardiovascular;  Laterality: N/A;    REVIEW OF SYSTEMS:  A comprehensive review of systems was negative except for: Constitutional: positive for fatigue   PHYSICAL EXAMINATION: General appearance: alert, cooperative and no distress Head: Normocephalic, without obvious abnormality, atraumatic Neck: no adenopathy, no JVD, supple, symmetrical, trachea midline and thyroid not enlarged, symmetric, no tenderness/mass/nodules  Lymph nodes: Cervical, supraclavicular, and axillary nodes normal. Resp: clear to auscultation bilaterally Back: symmetric, no curvature. ROM normal. No CVA tenderness. Cardio: regular rate and rhythm, S1, S2 normal, no murmur, click, rub or gallop GI: soft, non-tender; bowel sounds normal; no masses,  no organomegaly Extremities: extremities normal, atraumatic, no cyanosis or edema  ECOG PERFORMANCE STATUS: 1 - Symptomatic but completely ambulatory  Blood pressure (!) 140/58, pulse 95, temperature 98.5 F (36.9 C), temperature source Oral, resp. rate 18, height 5' 2" (1.575 m), weight 143 lb 6.4 oz (65 kg), SpO2 96 %.  LABORATORY DATA: Lab Results  Component Value Date   WBC 11.1 (H) 06/23/2018   HGB 12.2 06/23/2018   HCT 37.7 06/23/2018   MCV 99.5 06/23/2018   PLT 348 06/23/2018      Chemistry      Component Value Date/Time   NA 142 06/23/2018 0818   NA 142 12/24/2016 0816   K 4.1 06/23/2018 0818   K 4.4 12/24/2016 0816   CL 108 06/23/2018 0818   CL  106 06/30/2012 1004   CO2 23 06/23/2018 0818   CO2 28 12/24/2016 0816   BUN 17 06/23/2018 0818   BUN 15.8 12/24/2016 0816   CREATININE 0.78 06/23/2018 0818   CREATININE 0.87 07/22/2017 0826   CREATININE 1.1 12/24/2016 0816      Component Value Date/Time   CALCIUM 9.3 06/23/2018 0818   CALCIUM 9.6 12/24/2016 0816   ALKPHOS 81 06/23/2018 0818   ALKPHOS 80 12/24/2016 0816   AST 28 06/23/2018 0818   AST 20 07/22/2017 0826   AST 20 12/24/2016 0816   ALT 22 06/23/2018 0818   ALT 11 07/22/2017 0826   ALT 11 12/24/2016 0816   BILITOT 0.2 (L) 06/23/2018 0818   BILITOT 0.4 07/22/2017 0826   BILITOT 0.34 12/24/2016 0816       RADIOGRAPHIC STUDIES: No results found.  ASSESSMENT AND PLAN: This is a very pleasant 68 years old white female with metastatic non-small cell lung cancer diagnosed in October 2007 status post concurrent chemoradiation followed by consolidation chemotherapy and she is currently on maintenance  treatment with single agent Alimta status post 187 cycles. The patient has been tolerating this treatment well with no concerning adverse effects. I recommended for her to proceed with cycle #188 today as planned. I will see her back for follow-up visit in 3 weeks for evaluation before the next cycle of her treatment. She was advised to call immediately if she has any concerning symptoms in the interval. The patient voices understanding of current disease status and treatment options and is in agreement with the current care plan. All questions were answered. The patient knows to call the clinic with any problems, questions or concerns. We can certainly see the patient much sooner if necessary.  Disclaimer: This note was dictated with voice recognition software. Similar sounding words can inadvertently be transcribed and may not be corrected upon review.

## 2018-06-23 NOTE — Patient Instructions (Signed)
[facility] Discharge Instructions for Patients Receiving Chemotherapy  Today you received the following chemotherapy agents Alimta  To help prevent nausea and vomiting after your treatment, we encourage you to take your nausea medication as directed by MD   If you develop nausea and vomiting that is not controlled by your nausea medication, call the clinic.   BELOW ARE SYMPTOMS THAT SHOULD BE REPORTED IMMEDIATELY:  *FEVER GREATER THAN 100.5 F  *CHILLS WITH OR WITHOUT FEVER  NAUSEA AND VOMITING THAT IS NOT CONTROLLED WITH YOUR NAUSEA MEDICATION  *UNUSUAL SHORTNESS OF BREATH  *UNUSUAL BRUISING OR BLEEDING  TENDERNESS IN MOUTH AND THROAT WITH OR WITHOUT PRESENCE OF ULCERS  *URINARY PROBLEMS  *BOWEL PROBLEMS  UNUSUAL RASH Items with * indicate a potential emergency and should be followed up as soon as possible.  Feel free to call the clinic should you have any questions or concerns. The clinic phone number is [phone].  Please show the [place] at check-in to the Emergency Department and triage nurse.  Coronavirus (COVID-19) Are you at risk?  Are you at risk for the Coronavirus (COVID-19)?  To be considered HIGH RISK for Coronavirus (COVID-19), you have to meet the following criteria:  . Traveled to [place], [place], [place], [place] or [place]; or in the [place] to [place], [place], [place], or [place]; and have fever, cough, and shortness of breath within the last 2 weeks of travel OR . Been in close contact with a person diagnosed with COVID-19 within the last 2 weeks and have fever, cough, and shortness of breath . IF YOU DO NOT MEET THESE CRITERIA, YOU ARE CONSIDERED LOW RISK FOR COVID-19.  What to do if you are HIGH RISK for COVID-19?  Marland Kitchen If you are having a medical emergency, call 911. . Seek medical care right away. Before you go to a doctor's office, urgent care or emergency department, call ahead and tell them about your  recent travel, contact with someone diagnosed with COVID-19, and your symptoms. You should receive instructions from your physician's office regarding next steps of care.  . When you arrive at healthcare provider, tell the healthcare staff immediately you have returned from visiting [place], [place], [place], [place] or [place]; or traveled in the [place] to [place], [place], [place], or [place]; in the last two weeks or you have been in close contact with a person diagnosed with COVID-19 in the last 2 weeks.   . Tell the health care staff about your symptoms: fever, cough and shortness of breath. . After you have been seen by a medical provider, you will be either: o Tested for (COVID-19) and discharged home on quarantine except to seek medical care if symptoms worsen, and asked to  - Stay home and avoid contact with others until you get your results (4-5 days)  - Avoid travel on public transportation if possible (such as bus, train, or airplane) or o Sent to the Emergency Department by EMS for evaluation, COVID-19 testing, and possible admission depending on your condition and test results.  What to do if you are LOW RISK for COVID-19?  Reduce your risk of any infection by using the same precautions used for avoiding the common cold or flu:  Marland Kitchen Wash your hands often with soap and warm water for at least 20 seconds.  If soap and water are not readily available, use an alcohol-based hand sanitizer with at least 60% alcohol.  . If coughing  or sneezing, cover your mouth and nose by coughing or sneezing into the elbow areas of your shirt or coat, into a tissue or into your sleeve (not your hands). . Avoid shaking hands with others and consider head nods or verbal greetings only. . Avoid touching your eyes, nose, or mouth with unwashed hands.  . Avoid close contact with people who are sick. . Avoid places or events with large numbers of people in one location, like concerts or sporting  events. . Carefully consider travel plans you have or are making. . If you are planning any travel outside or inside the [place], visit the CDC's Travelers' Health webpage for the latest health notices. . If you have some symptoms but not all symptoms, continue to monitor at home and seek medical attention if your symptoms worsen. . If you are having a medical emergency, call 911.   [place] / e-Visit: [email or web address]         [facility]: [facility]: [phone]                   [facility]: [phone]

## 2018-06-25 ENCOUNTER — Telehealth: Payer: Self-pay | Admitting: Internal Medicine

## 2018-06-25 NOTE — Telephone Encounter (Signed)
Scheduled appt per los

## 2018-07-14 ENCOUNTER — Inpatient Hospital Stay: Payer: Medicare Other

## 2018-07-14 ENCOUNTER — Inpatient Hospital Stay (HOSPITAL_BASED_OUTPATIENT_CLINIC_OR_DEPARTMENT_OTHER): Payer: Medicare Other | Admitting: Internal Medicine

## 2018-07-14 ENCOUNTER — Encounter: Payer: Self-pay | Admitting: Internal Medicine

## 2018-07-14 ENCOUNTER — Other Ambulatory Visit: Payer: Self-pay

## 2018-07-14 ENCOUNTER — Inpatient Hospital Stay: Payer: Medicare Other | Attending: Internal Medicine

## 2018-07-14 VITALS — BP 164/60 | HR 86 | Temp 99.1°F | Resp 20 | Ht 62.0 in | Wt 146.3 lb

## 2018-07-14 DIAGNOSIS — Z95828 Presence of other vascular implants and grafts: Secondary | ICD-10-CM

## 2018-07-14 DIAGNOSIS — Z5111 Encounter for antineoplastic chemotherapy: Secondary | ICD-10-CM

## 2018-07-14 DIAGNOSIS — C3411 Malignant neoplasm of upper lobe, right bronchus or lung: Secondary | ICD-10-CM | POA: Diagnosis not present

## 2018-07-14 DIAGNOSIS — C7931 Secondary malignant neoplasm of brain: Secondary | ICD-10-CM | POA: Insufficient documentation

## 2018-07-14 DIAGNOSIS — C3490 Malignant neoplasm of unspecified part of unspecified bronchus or lung: Secondary | ICD-10-CM

## 2018-07-14 DIAGNOSIS — I10 Essential (primary) hypertension: Secondary | ICD-10-CM

## 2018-07-14 DIAGNOSIS — R110 Nausea: Secondary | ICD-10-CM | POA: Insufficient documentation

## 2018-07-14 DIAGNOSIS — C3410 Malignant neoplasm of upper lobe, unspecified bronchus or lung: Secondary | ICD-10-CM

## 2018-07-14 LAB — COMPREHENSIVE METABOLIC PANEL
ALT: 13 U/L (ref 0–44)
AST: 29 U/L (ref 15–41)
Albumin: 3.4 g/dL — ABNORMAL LOW (ref 3.5–5.0)
Alkaline Phosphatase: 94 U/L (ref 38–126)
Anion gap: 9 (ref 5–15)
BUN: 10 mg/dL (ref 8–23)
CO2: 26 mmol/L (ref 22–32)
Calcium: 9.4 mg/dL (ref 8.9–10.3)
Chloride: 105 mmol/L (ref 98–111)
Creatinine, Ser: 0.77 mg/dL (ref 0.44–1.00)
GFR calc Af Amer: >60 mL/min (ref >60)
GFR calc non Af Amer: >60 mL/min (ref >60)
Glucose, Bld: 113 mg/dL — ABNORMAL HIGH (ref 70–99)
Potassium: 4 mmol/L (ref 3.5–5.1)
Sodium: 140 mmol/L (ref 135–145)
Total Bilirubin: 0.4 mg/dL (ref 0.3–1.2)
Total Protein: 7.2 g/dL (ref 6.5–8.1)

## 2018-07-14 LAB — CBC WITH DIFFERENTIAL/PLATELET
Abs Immature Granulocytes: 0.04 10*3/uL (ref 0.00–0.07)
Basophils Absolute: 0 10*3/uL (ref 0.0–0.1)
Basophils Relative: 1 %
Eosinophils Absolute: 0.1 10*3/uL (ref 0.0–0.5)
Eosinophils Relative: 1 %
HCT: 37.8 % (ref 36.0–46.0)
Hemoglobin: 12.2 g/dL (ref 12.0–15.0)
Immature Granulocytes: 1 %
Lymphocytes Relative: 20 %
Lymphs Abs: 1.6 10*3/uL (ref 0.7–4.0)
MCH: 31.7 pg (ref 26.0–34.0)
MCHC: 32.3 g/dL (ref 30.0–36.0)
MCV: 98.2 fL (ref 80.0–100.0)
Monocytes Absolute: 0.9 10*3/uL (ref 0.1–1.0)
Monocytes Relative: 12 %
Neutro Abs: 5.2 10*3/uL (ref 1.7–7.7)
Neutrophils Relative %: 65 %
Platelets: 323 10*3/uL (ref 150–400)
RBC: 3.85 MIL/uL — ABNORMAL LOW (ref 3.87–5.11)
RDW: 13.4 % (ref 11.5–15.5)
WBC: 7.9 10*3/uL (ref 4.0–10.5)
nRBC: 0 % (ref 0.0–0.2)

## 2018-07-14 MED ORDER — CYANOCOBALAMIN 1000 MCG/ML IJ SOLN
INTRAMUSCULAR | Status: AC
  Filled 2018-07-14: qty 1

## 2018-07-14 MED ORDER — SODIUM CHLORIDE 0.9 % IV SOLN
500.0000 mg/m2 | Freq: Once | INTRAVENOUS | Status: AC
  Administered 2018-07-14: 11:00:00 800 mg via INTRAVENOUS
  Filled 2018-07-14: qty 12

## 2018-07-14 MED ORDER — SODIUM CHLORIDE 0.9 % IV SOLN
Freq: Once | INTRAVENOUS | Status: AC
  Administered 2018-07-14: 10:00:00 via INTRAVENOUS
  Filled 2018-07-14: qty 250

## 2018-07-14 MED ORDER — ONDANSETRON HCL 8 MG PO TABS
8.0000 mg | ORAL_TABLET | Freq: Once | ORAL | Status: AC
  Administered 2018-07-14: 10:00:00 8 mg via ORAL

## 2018-07-14 MED ORDER — SODIUM CHLORIDE 0.9% FLUSH
10.0000 mL | INTRAVENOUS | Status: DC | PRN
  Administered 2018-07-14: 10 mL via INTRAVENOUS
  Filled 2018-07-14: qty 10

## 2018-07-14 MED ORDER — MORPHINE SULFATE ER 60 MG PO TBCR: 60.0000 mg | EXTENDED_RELEASE_TABLET | Freq: Two times a day (BID) | ORAL | 0 refills | Status: DC

## 2018-07-14 MED ORDER — ONDANSETRON HCL 8 MG PO TABS
ORAL_TABLET | ORAL | Status: AC
  Filled 2018-07-14: qty 1

## 2018-07-14 MED ORDER — CYANOCOBALAMIN 1000 MCG/ML IJ SOLN
1000.0000 ug | Freq: Once | INTRAMUSCULAR | Status: AC
  Administered 2018-07-14: 10:00:00 1000 ug via INTRAMUSCULAR

## 2018-07-14 MED ORDER — SODIUM CHLORIDE 0.9% FLUSH
10.0000 mL | INTRAVENOUS | Status: DC | PRN
  Administered 2018-07-14: 10 mL
  Filled 2018-07-14: qty 10

## 2018-07-14 MED ORDER — DEXAMETHASONE SODIUM PHOSPHATE 10 MG/ML IJ SOLN
10.0000 mg | Freq: Once | INTRAMUSCULAR | Status: AC
  Administered 2018-07-14: 10:00:00 10 mg via INTRAVENOUS

## 2018-07-14 MED ORDER — HEPARIN SOD (PORK) LOCK FLUSH 100 UNIT/ML IV SOLN
500.0000 [IU] | Freq: Once | INTRAVENOUS | Status: AC | PRN
  Administered 2018-07-14: 500 [IU]
  Filled 2018-07-14: qty 5

## 2018-07-14 MED ORDER — DEXAMETHASONE SODIUM PHOSPHATE 10 MG/ML IJ SOLN
INTRAMUSCULAR | Status: AC
  Filled 2018-07-14: qty 1

## 2018-07-14 NOTE — Progress Notes (Signed)
[place] Telephone:(336) [phone]   Fax:(336) [phone]  OFFICE PROGRESS NOTE  [name], MD [place] [address]  DIAGNOSIS: Metastatic non-small cell lung cancer initially diagnosed as locally advanced stage IIIB with a right Pancoast tumor involving the vertebral body as well as prominent canal invasion with spinal cord compression in October 2007. The patient also has metastatic disease to the brain in April 2008.  PRIOR THERAPY: 1. Status post concurrent chemoradiation with weekly carboplatin and paclitaxel, last dose was given November 18, 2005. 2. 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 3. Status post gamma knife radiotherapy to a solitary brain lesion located in the superior frontal area of the brain at [facility] in April of 2008. 4. Status post palliative radiotherapy to the lateral abdominal wall metastatic lesion under the care of Dr. Lisbeth Renshaw, completed March of 2009. 5. Status post 6 cycles of systemic chemotherapy with carboplatin and Alimta. Last dose was given July 26, 2007 with disease stabilization. 6. Gamma knife stereotactic radiotherapy to 2 brain lesions one involving the right frontal dural based as well as right parietal lesion performed on 05/07/2012 under the care of Dr. Vallarie Mare at [facility]  CURRENT THERAPY: Maintenance systemic chemotherapy with Alimta 500 MG/M2 every 3 weeks, status post 189 cycles.  INTERVAL HISTORY: Danielle [name] 68 y.o. female returns to the clinic today for follow-up visit.  The patient is feeling fine today with no concerning complaints.  Her blood pressure is elevated today but she took her blood pressure medication earlier today.  She denied having any chest pain, shortness of breath, cough or hemoptysis.  She denied having any fever or chills.  She has no nausea, vomiting, diarrhea or constipation.   She denied having any headache or visual changes.  She is here today for evaluation before starting cycle #189.  MEDICAL HISTORY: Past Medical History:  Diagnosis Date  . Anemia   . 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 CVA (cerebral vascular accident) ([place]) 04/2014   pt states had 2 cva's within 2 wks  . DJD (degenerative joint disease), cervical   . Fibromyalgia   . H/O: pneumonia   . History of tobacco abuse quit 10/08   on nicotine patch  . Hypertension   . Hypokalemia   . Lung cancer ([place]) dx'd 09/2005   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 had chemo and radiation for lung ca  . Thrush 12/11/2010    ALLERGIES:  is allergic to contrast media [iodinated diagnostic agents]; iohexol; sulfa drugs cross reactors; and co-trimoxazole injection [sulfamethoxazole-trimethoprim].  MEDICATIONS:  Current Outpatient Medications  Medication Sig Dispense Refill  . amLODipine (NORVASC) 10 MG tablet TAKE 1 TABLET (10 MG TOTAL) BY MOUTH DAILY. 90 tablet 1  . aspirin 325 MG EC tablet TAKE 1 TABLET BY MOUTH EVERY DAY 90 tablet 0  . atorvastatin (LIPITOR) 40 MG tablet TAKE 1 TABLET BY MOUTH EVERY DAY AT 6 PM 90 tablet 0  . diphenhydrAMINE (BENADRYL) 25 MG tablet Take 1 tablet (25 mg total) by mouth as directed. 2 hours before CT scan. (Patient not taking: Reported on 01/27/2018) 5 tablet 0  . FeFum-FePoly-FA-B Cmp-C-Biot (INTEGRA PLUS) CAPS Take 1 capsule by mouth every morning. 90 capsule 3  . fluticasone (FLONASE) 50 MCG/ACT nasal spray Place 1-2 sprays into both nostrils daily as needed. 16 g 5  . folic acid (FOLVITE) 1 MG  tablet Take 1 tablet (1 mg total) by mouth daily. 90 tablet 0  . furosemide (LASIX) 20 MG tablet TAKE 1 TABLET BY MOUTH EVERY DAY AS NEEDED FOR SWELLING 20 tablet 1  . lacosamide (VIMPAT) 50 MG TABS tablet Take by mouth.    . lidocaine-prilocaine (EMLA) cream APPLY TO PORT-A-CATH AS DIRECTED 1-2 HOURS PRIOR TO CHEMOTHERAPY 30 g 1  . lisinopril  (PRINIVIL,ZESTRIL) 20 MG tablet TAKE 2 TABLETS (40 MG TOTAL) BY MOUTH DAILY. 180 tablet 3  . morphine (MS CONTIN) 60 MG 12 hr tablet Take 1 tablet (60 mg total) by mouth 2 (two) times daily. 60 tablet 0  . morphine (MSIR) 30 MG tablet Take 1 tablet (30 mg total) by mouth every 4 (four) hours as needed (breakthrough pain). 60 tablet 0  . Olopatadine HCl 0.2 % SOLN INSTILL 1 DROP INTO AFFECTED EYE(S) EVERY DAY 2.5 mL 1  . ondansetron (ZOFRAN) 8 MG tablet TAKE 1 TABLET BY MOUTH EVERY 8 HOURS AS NEEDED FOR FOR NAUSEA OR VOMITING 30 tablet 0  . predniSONE (DELTASONE) 50 MG tablet 1 tablet by mouth 13 hours ,7 hours and 1 hour  before CT scan 3 tablet 4  . triamcinolone (KENALOG) 0.025 % ointment Apply 1 application topically 2 (two) times daily. 30 g 0   No current facility-administered medications for this visit.     SURGICAL HISTORY:  Past Surgical History:  Procedure Laterality Date  . CERVICAL LAMINECTOMY  1995  . KNEE SURGERY  1990   Left x 2  . 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 LOOP RECORDER IMPLANT N/A 04/19/2014   Procedure: LOOP RECORDER IMPLANT;  Surgeon: Thompson Grayer, MD;  Location[facility] C[facility] LAB;  Service: Cardiovascular;  Laterality: N/A;  . OTHER SURGICAL HISTORY  2008   Gamma knife surgery to remove brain met   . PORTACATH PLACEMENT  -[name]   TIP IN CAVOATRIAL JUNCTION  . TEE WITHOUT CARDIOVERSION N/A 04/19/2014   Procedure: TRANSESOPHAGEAL ECHOCARDIOGRAM (TEE);  Surgeon: Larey Dresser, MD;  Location: [facility];  Service: Cardiovascular;  Laterality: N/A;    REVIEW OF SYSTEMS:  A comprehensive review of systems was negative except for: Constitutional: positive for fatigue   PHYSICAL EXAMINATION: General appearance: alert, cooperative and no distress Head: Normocephalic, without obvious abnormality, atraumatic Neck: no adenopathy, no JVD, supple, symmetrical, trachea midline and thyroid not enlarged, symmetric, no tenderness/mass/nodules  Lymph nodes: Cervical, supraclavicular, and axillary nodes normal. Resp: clear to auscultation bilaterally Back: symmetric, no curvature. ROM normal. No CVA tenderness. Cardio: regular rate and rhythm, S1, S2 normal, no murmur, click, rub or gallop GI: soft, non-tender; bowel sounds normal; no masses,  no organomegaly Extremities: extremities normal, atraumatic, no cyanosis or edema  ECOG PERFORMANCE STATUS: 1 - Symptomatic but completely ambulatory  Blood pressure (!) 164/60, pulse 86, temperature 99.1 F (37.3 C), temperature source Oral, resp. rate 20, height '5\' 2"'  (1.575 m), weight 146 lb 4.8 oz (66.4 kg), SpO2 98 %.  LABORATORY DATA: Lab Results  Component Value Date   WBC 7.9 07/14/2018   HGB 12.2 07/14/2018   HCT 37.8 07/14/2018   MCV 98.2 07/14/2018   PLT 323 07/14/2018      Chemistry      Component Value Date/Time   NA 142 06/23/2018 0818   NA 142 12/24/2016 0816   K 4.1 06/23/2018 0818   K 4.4 12/24/2016 0816   CL 108 06/23/2018 0818   CL 106 06/30/2012  1004   CO2 23 06/23/2018 0818   CO2 28 12/24/2016 0816   BUN 17 06/23/2018 0818   BUN 15.8 12/24/2016 0816   CREATININE 0.78 06/23/2018 0818   CREATININE 0.87 07/22/2017 0826   CREATININE 1.1 12/24/2016 0816      Component Value Date/Time   CALCIUM 9.3 06/23/2018 0818   CALCIUM 9.6 12/24/2016 0816   ALKPHOS 81 06/23/2018 0818   ALKPHOS 80 12/24/2016 0816   AST 28 06/23/2018 0818   AST 20 07/22/2017 0826   AST 20 12/24/2016 0816   ALT 22 06/23/2018 0818   ALT 11 07/22/2017 0826   ALT 11 12/24/2016 0816   BILITOT 0.2 (L) 06/23/2018 0818   BILITOT 0.4 07/22/2017 0826   BILITOT 0.34 12/24/2016 0816       RADIOGRAPHIC STUDIES: No results found.  ASSESSMENT AND PLAN: This is a very pleasant 68 years old white female with metastatic non-small cell lung cancer diagnosed in October 2007 status post concurrent chemoradiation followed by consolidation chemotherapy and she is currently on maintenance  treatment with single agent Alimta status post 188 cycles. The patient continues to tolerate the treatment well. I recommended for her to proceed with cycle #189 today as planned. She will come back for follow-up visit in 3 weeks for evaluation before the next cycle of her treatment. For pain management I gave her refill of MS Contin today. She was advised to call immediately if she has any other concerning symptoms in the interval. The patient voices understanding of current disease status and treatment options and is in agreement with the current care plan. All questions were answered. The patient knows to call the clinic with any problems, questions or concerns. We can certainly see the patient much sooner if necessary.  Disclaimer: This note was dictated with voice recognition software. Similar sounding words can inadvertently be transcribed and may not be corrected upon review.

## 2018-07-14 NOTE — Patient Instructions (Signed)

## 2018-07-14 NOTE — Patient Instructions (Signed)
[place] [facility] Discharge Instructions for Patients Receiving Chemotherapy  Today you received the following chemotherapy agents Pemetrexed (ALIMTA).  To help prevent nausea and vomiting after your treatment, we encourage you to take your nausea medication as prescribed.   If you develop nausea and vomiting that is not controlled by your nausea medication, call the clinic.   BELOW ARE SYMPTOMS THAT SHOULD BE REPORTED IMMEDIATELY:  *FEVER GREATER THAN 100.5 F  *CHILLS WITH OR WITHOUT FEVER  NAUSEA AND VOMITING THAT IS NOT CONTROLLED WITH YOUR NAUSEA MEDICATION  *UNUSUAL SHORTNESS OF BREATH  *UNUSUAL BRUISING OR BLEEDING  TENDERNESS IN MOUTH AND THROAT WITH OR WITHOUT PRESENCE OF ULCERS  *URINARY PROBLEMS  *BOWEL PROBLEMS  UNUSUAL RASH Items with * indicate a potential emergency and should be followed up as soon as possible.  Feel free to call the clinic should you have any questions or concerns. The clinic phone number is [phone].  Please show the [facility] at check-in to the Emergency Department and triage nurse.  Coronavirus (COVID-19) Are you at risk?  Are you at risk for the Coronavirus (COVID-19)?  To be considered HIGH RISK for Coronavirus (COVID-19), you have to meet the following criteria:  . Traveled to [place], [place], [place], [place] or [place]; or in the [place] to [place], [place], [place], or [place]; and have fever, cough, and shortness of breath within the last 2 weeks of travel OR . Been in close contact with a person diagnosed with COVID-19 within the last 2 weeks and have fever, cough, and shortness of breath . IF YOU DO NOT MEET THESE CRITERIA, YOU ARE CONSIDERED LOW RISK FOR COVID-19.  What to do if you are HIGH RISK for COVID-19?  Marland Kitchen If you are having a medical emergency, call 911. . Seek medical care right away. Before you go to a doctor's office, urgent care or emergency department, call ahead and tell them  about your recent travel, contact with someone diagnosed with COVID-19, and your symptoms. You should receive instructions from your physician's office regarding next steps of care.  . When you arrive at healthcare provider, tell the healthcare staff immediately you have returned from visiting [place], [place], [place], [place] or [place]; or traveled in the [place] to [place], [place], [place], or [place]; in the last two weeks or you have been in close contact with a person diagnosed with COVID-19 in the last 2 weeks.   . Tell the health care staff about your symptoms: fever, cough and shortness of breath. . After you have been seen by a medical provider, you will be either: o Tested for (COVID-19) and discharged home on quarantine except to seek medical care if symptoms worsen, and asked to  - Stay home and avoid contact with others until you get your results (4-5 days)  - Avoid travel on public transportation if possible (such as bus, train, or airplane) or o Sent to the Emergency Department by EMS for evaluation, COVID-19 testing, and possible admission depending on your condition and test results.  What to do if you are LOW RISK for COVID-19?  Reduce your risk of any infection by using the same precautions used for avoiding the common cold or flu:  Marland Kitchen Wash your hands often with soap and warm water for at least 20 seconds.  If soap and water are not readily available, use an alcohol-based hand sanitizer with at least 60% alcohol.  . If coughing or  sneezing, cover your mouth and nose by coughing or sneezing into the elbow areas of your shirt or coat, into a tissue or into your sleeve (not your hands). . Avoid shaking hands with others and consider head nods or verbal greetings only. . Avoid touching your eyes, nose, or mouth with unwashed hands.  . Avoid close contact with people who are sick. . Avoid places or events with large numbers of people in one location, like concerts or  sporting events. . Carefully consider travel plans you have or are making. . If you are planning any travel outside or inside the [place], visit the CDC's Travelers' Health webpage for the latest health notices. . If you have some symptoms but not all symptoms, continue to monitor at home and seek medical attention if your symptoms worsen. . If you are having a medical emergency, call 911.   [facility] / e-Visit: [email or web address]         [facility]: [facility]: [phone]                   [facility]: [phone]

## 2018-07-15 ENCOUNTER — Telehealth: Payer: Self-pay | Admitting: Internal Medicine

## 2018-07-15 NOTE — Telephone Encounter (Signed)
Scheduled appt per 7/07 los - pt to get an update schedule next visit.

## 2018-08-04 ENCOUNTER — Inpatient Hospital Stay: Payer: Medicare Other

## 2018-08-04 ENCOUNTER — Encounter: Payer: Self-pay | Admitting: Internal Medicine

## 2018-08-04 ENCOUNTER — Other Ambulatory Visit: Payer: Self-pay

## 2018-08-04 ENCOUNTER — Inpatient Hospital Stay (HOSPITAL_BASED_OUTPATIENT_CLINIC_OR_DEPARTMENT_OTHER): Payer: Medicare Other | Admitting: Internal Medicine

## 2018-08-04 VITALS — BP 162/61 | HR 93 | Temp 98.5°F | Resp 20 | Ht 62.0 in | Wt 148.4 lb

## 2018-08-04 DIAGNOSIS — C3490 Malignant neoplasm of unspecified part of unspecified bronchus or lung: Secondary | ICD-10-CM

## 2018-08-04 DIAGNOSIS — Z5111 Encounter for antineoplastic chemotherapy: Secondary | ICD-10-CM

## 2018-08-04 DIAGNOSIS — C3411 Malignant neoplasm of upper lobe, right bronchus or lung: Secondary | ICD-10-CM

## 2018-08-04 DIAGNOSIS — C7931 Secondary malignant neoplasm of brain: Secondary | ICD-10-CM

## 2018-08-04 DIAGNOSIS — R110 Nausea: Secondary | ICD-10-CM

## 2018-08-04 DIAGNOSIS — Z95828 Presence of other vascular implants and grafts: Secondary | ICD-10-CM

## 2018-08-04 LAB — CBC WITH DIFFERENTIAL/PLATELET
Abs Immature Granulocytes: 0.04 10*3/uL (ref 0.00–0.07)
Basophils Absolute: 0 10*3/uL (ref 0.0–0.1)
Basophils Relative: 1 %
Eosinophils Absolute: 0.1 10*3/uL (ref 0.0–0.5)
Eosinophils Relative: 1 %
HCT: 37.8 % (ref 36.0–46.0)
Hemoglobin: 12.2 g/dL (ref 12.0–15.0)
Immature Granulocytes: 1 %
Lymphocytes Relative: 23 %
Lymphs Abs: 2 10*3/uL (ref 0.7–4.0)
MCH: 31.4 pg (ref 26.0–34.0)
MCHC: 32.3 g/dL (ref 30.0–36.0)
MCV: 97.2 fL (ref 80.0–100.0)
Monocytes Absolute: 1.1 10*3/uL — ABNORMAL HIGH (ref 0.1–1.0)
Monocytes Relative: 13 %
Neutro Abs: 5.5 10*3/uL (ref 1.7–7.7)
Neutrophils Relative %: 61 %
Platelets: 301 10*3/uL (ref 150–400)
RBC: 3.89 MIL/uL (ref 3.87–5.11)
RDW: 13.2 % (ref 11.5–15.5)
WBC: 8.7 10*3/uL (ref 4.0–10.5)
nRBC: 0 % (ref 0.0–0.2)

## 2018-08-04 LAB — COMPREHENSIVE METABOLIC PANEL
ALT: 14 U/L (ref 0–44)
AST: 22 U/L (ref 15–41)
Albumin: 3.5 g/dL (ref 3.5–5.0)
Alkaline Phosphatase: 82 U/L (ref 38–126)
Anion gap: 8 (ref 5–15)
BUN: 11 mg/dL (ref 8–23)
CO2: 26 mmol/L (ref 22–32)
Calcium: 9.6 mg/dL (ref 8.9–10.3)
Chloride: 107 mmol/L (ref 98–111)
Creatinine, Ser: 0.79 mg/dL (ref 0.44–1.00)
GFR calc Af Amer: >60 mL/min (ref >60)
GFR calc non Af Amer: >60 mL/min (ref >60)
Glucose, Bld: 110 mg/dL — ABNORMAL HIGH (ref 70–99)
Potassium: 4.8 mmol/L (ref 3.5–5.1)
Sodium: 141 mmol/L (ref 135–145)
Total Bilirubin: 0.4 mg/dL (ref 0.3–1.2)
Total Protein: 7 g/dL (ref 6.5–8.1)

## 2018-08-04 MED ORDER — SODIUM CHLORIDE 0.9% FLUSH
10.0000 mL | INTRAVENOUS | Status: DC | PRN
  Administered 2018-08-04: 10 mL via INTRAVENOUS
  Filled 2018-08-04: qty 10

## 2018-08-04 MED ORDER — ALTEPLASE 2 MG IJ SOLR
INTRAMUSCULAR | Status: AC
  Filled 2018-08-04: qty 2

## 2018-08-04 MED ORDER — HEPARIN SOD (PORK) LOCK FLUSH 100 UNIT/ML IV SOLN
500.0000 [IU] | Freq: Once | INTRAVENOUS | Status: AC | PRN
  Administered 2018-08-04: 500 [IU]
  Filled 2018-08-04: qty 5

## 2018-08-04 MED ORDER — SODIUM CHLORIDE 0.9 % IV SOLN
500.0000 mg/m2 | Freq: Once | INTRAVENOUS | Status: AC
  Administered 2018-08-04: 12:00:00 800 mg via INTRAVENOUS
  Filled 2018-08-04: qty 20

## 2018-08-04 MED ORDER — DEXAMETHASONE SODIUM PHOSPHATE 10 MG/ML IJ SOLN
INTRAMUSCULAR | Status: AC
  Filled 2018-08-04: qty 1

## 2018-08-04 MED ORDER — ONDANSETRON HCL 8 MG PO TABS
ORAL_TABLET | ORAL | Status: AC
  Filled 2018-08-04: qty 1

## 2018-08-04 MED ORDER — DEXAMETHASONE SODIUM PHOSPHATE 10 MG/ML IJ SOLN
10.0000 mg | Freq: Once | INTRAMUSCULAR | Status: AC
  Administered 2018-08-04: 10 mg via INTRAVENOUS

## 2018-08-04 MED ORDER — ALTEPLASE 2 MG IJ SOLR
2.0000 mg | Freq: Once | INTRAMUSCULAR | Status: AC
  Administered 2018-08-04: 09:00:00 2 mg
  Filled 2018-08-04: qty 2

## 2018-08-04 MED ORDER — SODIUM CHLORIDE 0.9% FLUSH
10.0000 mL | INTRAVENOUS | Status: DC | PRN
  Administered 2018-08-04: 12:00:00 10 mL
  Filled 2018-08-04: qty 10

## 2018-08-04 MED ORDER — SODIUM CHLORIDE 0.9 % IV SOLN
Freq: Once | INTRAVENOUS | Status: AC
  Administered 2018-08-04: 10:00:00 via INTRAVENOUS
  Filled 2018-08-04: qty 250

## 2018-08-04 MED ORDER — ONDANSETRON HCL 8 MG PO TABS
8.0000 mg | ORAL_TABLET | Freq: Once | ORAL | Status: AC
  Administered 2018-08-04: 10:00:00 8 mg via ORAL

## 2018-08-04 NOTE — Progress Notes (Signed)
[facility] [phone]   [phone]  OFFICE PROGRESS NOTE  [name], MD [place] [address]  DIAGNOSIS: Metastatic non-small cell lung cancer initially diagnosed as locally advanced stage IIIB with a right Pancoast tumor involving the vertebral body as well as prominent canal invasion with spinal cord compression in October 2007. The patient also has metastatic disease to the brain in April 2008.  PRIOR THERAPY: 1. Status post concurrent chemoradiation with weekly carboplatin and paclitaxel, last dose was given November 18, 2005. 2. 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 3. Status post gamma knife radiotherapy to a solitary brain lesion located in the superior frontal area of the brain at [facility] in April of 2008. 4. Status post palliative radiotherapy to the lateral abdominal wall metastatic lesion under the care of Dr. Lisbeth Renshaw, completed March of 2009. 5. Status post 6 cycles of systemic chemotherapy with carboplatin and Alimta. Last dose was given July 26, 2007 with disease stabilization. 6. Gamma knife stereotactic radiotherapy to 2 brain lesions one involving the right frontal dural based as well as right parietal lesion performed on 05/07/2012 under the care of Dr. Vallarie Mare at [facility].  CURRENT THERAPY: Maintenance systemic chemotherapy with Alimta 500 MG/M2 every 3 weeks, status post 189 cycles.  INTERVAL HISTORY: Danielle [name] 68 y.o. female returns to the clinic today for follow-up visit.  The patient is feeling fine today with no concerning complaints.  She denied having any recent chest pain, shortness of breath, cough or hemoptysis.  She has occasional nausea and requesting refill of Zofran.  She denied having any vomiting, diarrhea or constipation.  She has no recent weight loss or night sweats.  She is here for  evaluation before starting cycle #190 of her treatment.  MEDICAL HISTORY: Past Medical History:  Diagnosis Date  . Anemia   . 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 CVA (cerebral vascular accident) ([facility]) 04/2014   pt states had 2 cva's within 2 wks  . DJD (degenerative joint disease), cervical   . Fibromyalgia   . H/O: pneumonia   . History of tobacco abuse quit 10/08   on nicotine patch  . Hypertension   . Hypokalemia   . Lung cancer ([facility]) dx'd 09/2005   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 had chemo and radiation for lung ca  . Thrush 12/11/2010    ALLERGIES:  is allergic to contrast media [iodinated diagnostic agents]; iohexol; sulfa drugs cross reactors; and co-trimoxazole injection [sulfamethoxazole-trimethoprim].  MEDICATIONS:  Current Outpatient Medications  Medication Sig Dispense Refill  . amLODipine (NORVASC) 10 MG tablet TAKE 1 TABLET (10 MG TOTAL) BY MOUTH DAILY. 90 tablet 1  . aspirin 325 MG EC tablet TAKE 1 TABLET BY MOUTH EVERY DAY 90 tablet 0  . atorvastatin (LIPITOR) 40 MG tablet TAKE 1 TABLET BY MOUTH EVERY DAY AT 6 PM 90 tablet 0  . diphenhydrAMINE (BENADRYL) 25 MG tablet Take 1 tablet (25 mg total) by mouth as directed. 2 hours before CT scan. (Patient not taking: Reported on 01/27/2018) 5 tablet 0  . FeFum-FePoly-FA-B Cmp-C-Biot (INTEGRA PLUS) CAPS Take 1 capsule by mouth every morning. 90 capsule 3  . fluticasone (FLONASE) 50 MCG/ACT nasal spray Place 1-2 sprays into both nostrils daily as needed. 16 g 5  . folic acid (FOLVITE) 1 MG tablet Take 1 tablet (1 mg total) by mouth daily.  90 tablet 0  . furosemide (LASIX) 20 MG tablet TAKE 1 TABLET BY MOUTH EVERY DAY AS NEEDED FOR SWELLING 20 tablet 1  . lacosamide (VIMPAT) 50 MG TABS tablet Take by mouth.    . lidocaine-prilocaine (EMLA) cream APPLY TO PORT-A-CATH AS DIRECTED 1-2 HOURS PRIOR TO CHEMOTHERAPY 30 g 1  . lisinopril (PRINIVIL,ZESTRIL) 20 MG tablet TAKE 2 TABLETS (40 MG TOTAL) BY MOUTH  DAILY. 180 tablet 3  . morphine (MS CONTIN) 60 MG 12 hr tablet Take 1 tablet (60 mg total) by mouth 2 (two) times daily. 60 tablet 0  . morphine (MSIR) 30 MG tablet Take 1 tablet (30 mg total) by mouth every 4 (four) hours as needed (breakthrough pain). 60 tablet 0  . Olopatadine HCl 0.2 % SOLN INSTILL 1 DROP INTO AFFECTED EYE(S) EVERY DAY 2.5 mL 1  . ondansetron (ZOFRAN) 8 MG tablet TAKE 1 TABLET BY MOUTH EVERY 8 HOURS AS NEEDED FOR FOR NAUSEA OR VOMITING 30 tablet 0  . predniSONE (DELTASONE) 50 MG tablet 1 tablet by mouth 13 hours ,7 hours and 1 hour  before CT scan 3 tablet 4  . triamcinolone (KENALOG) 0.025 % ointment Apply 1 application topically 2 (two) times daily. 30 g 0   No current facility-administered medications for this visit.     SURGICAL HISTORY:  Past Surgical History:  Procedure Laterality Date  . CERVICAL LAMINECTOMY  1995  . KNEE SURGERY  1990   Left x 2  . 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 LOOP RECORDER IMPLANT N/A 04/19/2014   Procedure: LOOP RECORDER IMPLANT;  Surgeon: Thompson Grayer, MD;  Location: [facility] CATH LAB;  Service: Cardiovascular;  Laterality: N/A;  . OTHER SURGICAL HISTORY  2008   Gamma knife surgery to remove brain met   . PORTACATH PLACEMENT  -[name]   TIP IN CAVOATRIAL JUNCTION  . TEE WITHOUT CARDIOVERSION N/A 04/19/2014   Procedure: TRANSESOPHAGEAL ECHOCARDIOGRAM (TEE);  Surgeon: Larey Dresser, MD;  Location: [facility] ENDOSCOPY;  Service: Cardiovascular;  Laterality: N/A;    REVIEW OF SYSTEMS:  A comprehensive review of systems was negative.   PHYSICAL EXAMINATION: General appearance: alert, cooperative and no distress Head: Normocephalic, without obvious abnormality, atraumatic Neck: no adenopathy, no JVD, supple, symmetrical, trachea midline and thyroid not enlarged, symmetric, no tenderness/mass/nodules Lymph nodes: Cervical, supraclavicular, and axillary nodes normal. Resp: clear to auscultation bilaterally Back:  symmetric, no curvature. ROM normal. No CVA tenderness. Cardio: regular rate and rhythm, S1, S2 normal, no murmur, click, rub or gallop GI: soft, non-tender; bowel sounds normal; no masses,  no organomegaly Extremities: extremities normal, atraumatic, no cyanosis or edema  ECOG PERFORMANCE STATUS: 1 - Symptomatic but completely ambulatory  Blood pressure (!) 162/61, pulse 93, temperature 98.5 F (36.9 C), temperature source Oral, resp. rate 20, height '5\' 2"'  (1.575 m), weight 148 lb 6.4 oz (67.3 kg), SpO2 97 %.  LABORATORY DATA: Lab Results  Component Value Date   WBC 8.7 08/04/2018   HGB 12.2 08/04/2018   HCT 37.8 08/04/2018   MCV 97.2 08/04/2018   PLT 301 08/04/2018      Chemistry      Component Value Date/Time   NA 140 07/14/2018 0820   NA 142 12/24/2016 0816   K 4.0 07/14/2018 0820   K 4.4 12/24/2016 0816   CL 105 07/14/2018 0820   CL 106 06/30/2012 1004   CO2 26 07/14/2018 0820   CO2 28 12/24/2016 0816   BUN  10 07/14/2018 0820   BUN 15.8 12/24/2016 0816   CREATININE 0.77 07/14/2018 0820   CREATININE 0.87 07/22/2017 0826   CREATININE 1.1 12/24/2016 0816      Component Value Date/Time   CALCIUM 9.4 07/14/2018 0820   CALCIUM 9.6 12/24/2016 0816   ALKPHOS 94 07/14/2018 0820   ALKPHOS 80 12/24/2016 0816   AST 29 07/14/2018 0820   AST 20 07/22/2017 0826   AST 20 12/24/2016 0816   ALT 13 07/14/2018 0820   ALT 11 07/22/2017 0826   ALT 11 12/24/2016 0816   BILITOT 0.4 07/14/2018 0820   BILITOT 0.4 07/22/2017 0826   BILITOT 0.34 12/24/2016 0816       RADIOGRAPHIC STUDIES: No results found.  ASSESSMENT AND PLAN: This is a very pleasant 68 years old white female with metastatic non-small cell lung cancer diagnosed in October 2007 status post concurrent chemoradiation followed by consolidation chemotherapy and she is currently on maintenance treatment with single agent Alimta status post 189 cycles. The patient has been tolerating her treatment well with no  concerning adverse effects. We will proceed with her treatment today as planned. She will come back for follow-up visit in 3 weeks for evaluation before the next cycle of her treatment. For the intermittent nausea, I will give her a refill of Zofran today. She was advised to call immediately if she has any concerning symptoms in the interval. The patient voices understanding of current disease status and treatment options and is in agreement with the current care plan. All questions were answered. The patient knows to call the clinic with any problems, questions or concerns. We can certainly see the patient much sooner if necessary.  Disclaimer: This note was dictated with voice recognition software. Similar sounding words can inadvertently be transcribed and may not be corrected upon review.

## 2018-08-04 NOTE — Patient Instructions (Signed)
[facility] Discharge Instructions for Patients Receiving Chemotherapy  Today you received the following chemotherapy agents Pemetrexed (ALIMTA).  To help prevent nausea and vomiting after your treatment, we encourage you to take your nausea medication as prescribed.   If you develop nausea and vomiting that is not controlled by your nausea medication, call the clinic.   BELOW ARE SYMPTOMS THAT SHOULD BE REPORTED IMMEDIATELY:  *FEVER GREATER THAN 100.5 F  *CHILLS WITH OR WITHOUT FEVER  NAUSEA AND VOMITING THAT IS NOT CONTROLLED WITH YOUR NAUSEA MEDICATION  *UNUSUAL SHORTNESS OF BREATH  *UNUSUAL BRUISING OR BLEEDING  TENDERNESS IN MOUTH AND THROAT WITH OR WITHOUT PRESENCE OF ULCERS  *URINARY PROBLEMS  *BOWEL PROBLEMS  UNUSUAL RASH Items with * indicate a potential emergency and should be followed up as soon as possible.  Feel free to call the clinic should you have any questions or concerns. The clinic phone number is [phone].  Please show the [facility] at check-in to the Emergency Department and triage nurse.  Coronavirus (COVID-19) Are you at risk?  Are you at risk for the Coronavirus (COVID-19)?  To be considered HIGH RISK for Coronavirus (COVID-19), you have to meet the following criteria:  . Traveled to [place], [place], [place], [place] or [place]; or in the [place] to [place], [place], [place], or [place]; and have fever, cough, and shortness of breath within the last 2 weeks of travel OR . Been in close contact with a person diagnosed with COVID-19 within the last 2 weeks and have fever, cough, and shortness of breath . IF YOU DO NOT MEET THESE CRITERIA, YOU ARE CONSIDERED LOW RISK FOR COVID-19.  What to do if you are HIGH RISK for COVID-19?  Marland Kitchen If you are having a medical emergency, call 911. . Seek medical care right away. Before you go to a doctor's office, urgent care or emergency department, call ahead and tell them  about your recent travel, contact with someone diagnosed with COVID-19, and your symptoms. You should receive instructions from your physician's office regarding next steps of care.  . When you arrive at healthcare provider, tell the healthcare staff immediately you have returned from visiting [place], [place], [place], [place] or [place]; or traveled in the [place] to [place], [place], [place], or [place]; in the last two weeks or you have been in close contact with a person diagnosed with COVID-19 in the last 2 weeks.   . Tell the health care staff about your symptoms: fever, cough and shortness of breath. . After you have been seen by a medical provider, you will be either: o Tested for (COVID-19) and discharged home on quarantine except to seek medical care if symptoms worsen, and asked to  - Stay home and avoid contact with others until you get your results (4-5 days)  - Avoid travel on public transportation if possible (such as bus, train, or airplane) or o Sent to the Emergency Department by EMS for evaluation, COVID-19 testing, and possible admission depending on your condition and test results.  What to do if you are LOW RISK for COVID-19?  Reduce your risk of any infection by using the same precautions used for avoiding the common cold or flu:  Marland Kitchen Wash your hands often with soap and warm water for at least 20 seconds.  If soap and water are not readily available, use an alcohol-based hand sanitizer with at least 60% alcohol.  . If coughing or  sneezing, cover your mouth and nose by coughing or sneezing into the elbow areas of your shirt or coat, into a tissue or into your sleeve (not your hands). . Avoid shaking hands with others and consider head nods or verbal greetings only. . Avoid touching your eyes, nose, or mouth with unwashed hands.  . Avoid close contact with people who are sick. . Avoid places or events with large numbers of people in one location, like concerts or  sporting events. . Carefully consider travel plans you have or are making. . If you are planning any travel outside or inside the [place], visit the CDC's Travelers' Health webpage for the latest health notices. . If you have some symptoms but not all symptoms, continue to monitor at home and seek medical attention if your symptoms worsen. . If you are having a medical emergency, call 911.   [facility] / e-Visit: [email or web address]         [facility]: [facility]: [phone]                   [facility]: [phone]

## 2018-08-08 ENCOUNTER — Other Ambulatory Visit: Payer: Self-pay | Admitting: Internal Medicine

## 2018-08-08 DIAGNOSIS — C7931 Secondary malignant neoplasm of brain: Secondary | ICD-10-CM

## 2018-08-11 ENCOUNTER — Ambulatory Visit: Payer: Medicare Other

## 2018-08-14 ENCOUNTER — Other Ambulatory Visit: Payer: Self-pay | Admitting: Internal Medicine

## 2018-08-14 ENCOUNTER — Telehealth: Payer: Self-pay | Admitting: *Deleted

## 2018-08-14 DIAGNOSIS — C3410 Malignant neoplasm of upper lobe, unspecified bronchus or lung: Secondary | ICD-10-CM

## 2018-08-14 DIAGNOSIS — C3490 Malignant neoplasm of unspecified part of unspecified bronchus or lung: Secondary | ICD-10-CM

## 2018-08-14 MED ORDER — MORPHINE SULFATE 30 MG PO TABS: 30.0000 mg | ORAL_TABLET | ORAL | 0 refills | Status: DC | PRN

## 2018-08-14 MED ORDER — MORPHINE SULFATE ER 60 MG PO TBCR: 60.0000 mg | EXTENDED_RELEASE_TABLET | Freq: Two times a day (BID) | ORAL | 0 refills | Status: DC

## 2018-08-14 NOTE — Telephone Encounter (Signed)
Received call from patient requesting refills on both her MSIR and her MSContin. MSContin last filled on 07/14/18. MSIR last filled on 06/15/18. She uses [facility] in [place], [place]

## 2018-08-20 ENCOUNTER — Other Ambulatory Visit (INDEPENDENT_AMBULATORY_CARE_PROVIDER_SITE_OTHER): Payer: Medicare Other

## 2018-08-20 ENCOUNTER — Telehealth: Payer: Self-pay | Admitting: Family Medicine

## 2018-08-20 ENCOUNTER — Ambulatory Visit: Payer: Medicare Other

## 2018-08-20 ENCOUNTER — Other Ambulatory Visit: Payer: Self-pay

## 2018-08-20 DIAGNOSIS — E119 Type 2 diabetes mellitus without complications: Secondary | ICD-10-CM | POA: Diagnosis not present

## 2018-08-20 LAB — LIPID PANEL
Cholesterol: 143 mg/dL (ref 0–200)
HDL: 47 mg/dL (ref >39.00)
LDL Cholesterol: 68 mg/dL (ref 0–99)
NonHDL: 95.8
Total CHOL/HDL Ratio: 3
Triglycerides: 137 mg/dL (ref 0.0–149.0)
VLDL: 27.4 mg/dL (ref 0.0–40.0)

## 2018-08-20 LAB — HEMOGLOBIN A1C: Hgb A1c MFr Bld: 5.8 % (ref 4.6–6.5)

## 2018-08-20 NOTE — Telephone Encounter (Signed)
-----   Message from Ellamae Sia sent at 08/10/2018  3:52 PM EDT ----- Regarding: Lab orders for Thursday, 8.13.20  AWV lab orders, please.

## 2018-08-21 NOTE — Progress Notes (Signed)
No critical labs need to be addressed urgently. We will discuss labs in detail at upcoming office visit.   

## 2018-08-25 ENCOUNTER — Inpatient Hospital Stay: Payer: Medicare Other | Attending: Internal Medicine | Admitting: Internal Medicine

## 2018-08-25 ENCOUNTER — Other Ambulatory Visit: Payer: Self-pay

## 2018-08-25 ENCOUNTER — Inpatient Hospital Stay: Payer: Medicare Other

## 2018-08-25 ENCOUNTER — Inpatient Hospital Stay (HOSPITAL_BASED_OUTPATIENT_CLINIC_OR_DEPARTMENT_OTHER): Payer: Medicare Other | Admitting: Internal Medicine

## 2018-08-25 ENCOUNTER — Encounter: Payer: Self-pay | Admitting: Internal Medicine

## 2018-08-25 ENCOUNTER — Telehealth: Payer: Self-pay | Admitting: Internal Medicine

## 2018-08-25 VITALS — BP 166/66 | HR 88 | Temp 98.3°F | Resp 20 | Ht 62.0 in | Wt 148.1 lb

## 2018-08-25 DIAGNOSIS — C3411 Malignant neoplasm of upper lobe, right bronchus or lung: Secondary | ICD-10-CM | POA: Diagnosis not present

## 2018-08-25 DIAGNOSIS — E876 Hypokalemia: Secondary | ICD-10-CM | POA: Insufficient documentation

## 2018-08-25 DIAGNOSIS — R5383 Other fatigue: Secondary | ICD-10-CM | POA: Diagnosis not present

## 2018-08-25 DIAGNOSIS — Z7982 Long term (current) use of aspirin: Secondary | ICD-10-CM | POA: Diagnosis not present

## 2018-08-25 DIAGNOSIS — Z95828 Presence of other vascular implants and grafts: Secondary | ICD-10-CM

## 2018-08-25 DIAGNOSIS — Z923 Personal history of irradiation: Secondary | ICD-10-CM | POA: Diagnosis not present

## 2018-08-25 DIAGNOSIS — Z8673 Personal history of transient ischemic attack (TIA), and cerebral infarction without residual deficits: Secondary | ICD-10-CM | POA: Diagnosis not present

## 2018-08-25 DIAGNOSIS — D6481 Anemia due to antineoplastic chemotherapy: Secondary | ICD-10-CM

## 2018-08-25 DIAGNOSIS — Z5111 Encounter for antineoplastic chemotherapy: Secondary | ICD-10-CM | POA: Diagnosis not present

## 2018-08-25 DIAGNOSIS — I10 Essential (primary) hypertension: Secondary | ICD-10-CM | POA: Diagnosis not present

## 2018-08-25 DIAGNOSIS — M47892 Other spondylosis, cervical region: Secondary | ICD-10-CM | POA: Diagnosis not present

## 2018-08-25 DIAGNOSIS — C7931 Secondary malignant neoplasm of brain: Secondary | ICD-10-CM | POA: Diagnosis not present

## 2018-08-25 DIAGNOSIS — M797 Fibromyalgia: Secondary | ICD-10-CM | POA: Diagnosis not present

## 2018-08-25 DIAGNOSIS — Z79899 Other long term (current) drug therapy: Secondary | ICD-10-CM | POA: Diagnosis not present

## 2018-08-25 DIAGNOSIS — T451X5A Adverse effect of antineoplastic and immunosuppressive drugs, initial encounter: Secondary | ICD-10-CM | POA: Diagnosis not present

## 2018-08-25 DIAGNOSIS — Z87891 Personal history of nicotine dependence: Secondary | ICD-10-CM | POA: Diagnosis not present

## 2018-08-25 DIAGNOSIS — C3490 Malignant neoplasm of unspecified part of unspecified bronchus or lung: Secondary | ICD-10-CM

## 2018-08-25 LAB — COMPREHENSIVE METABOLIC PANEL
ALT: 18 U/L (ref 0–44)
AST: 26 U/L (ref 15–41)
Albumin: 3.7 g/dL (ref 3.5–5.0)
Alkaline Phosphatase: 87 U/L (ref 38–126)
Anion gap: 10 (ref 5–15)
BUN: 12 mg/dL (ref 8–23)
CO2: 25 mmol/L (ref 22–32)
Calcium: 9.8 mg/dL (ref 8.9–10.3)
Chloride: 105 mmol/L (ref 98–111)
Creatinine, Ser: 0.77 mg/dL (ref 0.44–1.00)
GFR calc Af Amer: >60 mL/min (ref >60)
GFR calc non Af Amer: >60 mL/min (ref >60)
Glucose, Bld: 102 mg/dL — ABNORMAL HIGH (ref 70–99)
Potassium: 4 mmol/L (ref 3.5–5.1)
Sodium: 140 mmol/L (ref 135–145)
Total Bilirubin: 0.4 mg/dL (ref 0.3–1.2)
Total Protein: 7.3 g/dL (ref 6.5–8.1)

## 2018-08-25 LAB — CBC WITH DIFFERENTIAL/PLATELET
Abs Immature Granulocytes: 0.04 10*3/uL (ref 0.00–0.07)
Basophils Absolute: 0 10*3/uL (ref 0.0–0.1)
Basophils Relative: 0 %
Eosinophils Absolute: 0.1 10*3/uL (ref 0.0–0.5)
Eosinophils Relative: 1 %
HCT: 40.3 % (ref 36.0–46.0)
Hemoglobin: 13.3 g/dL (ref 12.0–15.0)
Immature Granulocytes: 0 %
Lymphocytes Relative: 30 %
Lymphs Abs: 2.9 10*3/uL (ref 0.7–4.0)
MCH: 31.6 pg (ref 26.0–34.0)
MCHC: 33 g/dL (ref 30.0–36.0)
MCV: 95.7 fL (ref 80.0–100.0)
Monocytes Absolute: 1.1 10*3/uL — ABNORMAL HIGH (ref 0.1–1.0)
Monocytes Relative: 11 %
Neutro Abs: 5.6 10*3/uL (ref 1.7–7.7)
Neutrophils Relative %: 58 %
Platelets: 355 10*3/uL (ref 150–400)
RBC: 4.21 MIL/uL (ref 3.87–5.11)
RDW: 13.1 % (ref 11.5–15.5)
WBC: 9.8 10*3/uL (ref 4.0–10.5)
nRBC: 0 % (ref 0.0–0.2)

## 2018-08-25 MED ORDER — SODIUM CHLORIDE 0.9% FLUSH
10.0000 mL | INTRAVENOUS | Status: DC | PRN
  Administered 2018-08-25: 10 mL
  Filled 2018-08-25: qty 10

## 2018-08-25 MED ORDER — DEXAMETHASONE SODIUM PHOSPHATE 10 MG/ML IJ SOLN
10.0000 mg | Freq: Once | INTRAMUSCULAR | Status: AC
  Administered 2018-08-25: 10:00:00 10 mg via INTRAVENOUS

## 2018-08-25 MED ORDER — ONDANSETRON HCL 8 MG PO TABS
8.0000 mg | ORAL_TABLET | Freq: Once | ORAL | Status: AC
  Administered 2018-08-25: 10:00:00 8 mg via ORAL

## 2018-08-25 MED ORDER — ONDANSETRON HCL 8 MG PO TABS
ORAL_TABLET | ORAL | Status: AC
  Filled 2018-08-25: qty 1

## 2018-08-25 MED ORDER — SODIUM CHLORIDE 0.9 % IV SOLN
500.0000 mg/m2 | Freq: Once | INTRAVENOUS | Status: AC
  Administered 2018-08-25: 11:00:00 800 mg via INTRAVENOUS
  Filled 2018-08-25: qty 20

## 2018-08-25 MED ORDER — SODIUM CHLORIDE 0.9 % IV SOLN
Freq: Once | INTRAVENOUS | Status: AC
  Administered 2018-08-25: 10:00:00 via INTRAVENOUS
  Filled 2018-08-25: qty 250

## 2018-08-25 MED ORDER — DEXAMETHASONE SODIUM PHOSPHATE 10 MG/ML IJ SOLN
INTRAMUSCULAR | Status: AC
  Filled 2018-08-25: qty 1

## 2018-08-25 MED ORDER — HEPARIN SOD (PORK) LOCK FLUSH 100 UNIT/ML IV SOLN
500.0000 [IU] | Freq: Once | INTRAVENOUS | Status: AC | PRN
  Administered 2018-08-25: 11:00:00 500 [IU]
  Filled 2018-08-25: qty 5

## 2018-08-25 MED ORDER — SODIUM CHLORIDE 0.9% FLUSH
10.0000 mL | INTRAVENOUS | Status: DC | PRN
  Administered 2018-08-25: 09:00:00 10 mL via INTRAVENOUS
  Filled 2018-08-25: qty 10

## 2018-08-25 NOTE — Progress Notes (Signed)
No critical labs need to be addressed urgently. We will discuss labs in detail at upcoming office visit.   

## 2018-08-25 NOTE — Progress Notes (Signed)
Danielle [name] [phone]   [phone]  OFFICE PROGRESS NOTE  [name], MD [place] [address]  DIAGNOSIS: Metastatic non-small cell lung cancer initially diagnosed as locally advanced stage IIIB with a right Pancoast tumor involving the vertebral body as well as prominent canal invasion with spinal cord compression in October 2007. The patient also has metastatic disease to the brain in April 2008.  PRIOR THERAPY: 1. Status post concurrent chemoradiation with weekly carboplatin and paclitaxel, last dose was given November 18, 2005. 2. 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 3. Status post gamma knife radiotherapy to a solitary brain lesion located in the superior frontal area of the brain at [facility] in April of 2008. 4. Status post palliative radiotherapy to the lateral abdominal wall metastatic lesion under the care of Dr. Lisbeth Renshaw, completed March of 2009. 5. Status post 6 cycles of systemic chemotherapy with carboplatin and Alimta. Last dose was given July 26, 2007 with disease stabilization. 6. Gamma knife stereotactic radiotherapy to 2 brain lesions one involving the right frontal dural based as well as right parietal lesion performed on 05/07/2012 under the care of Dr. Vallarie Mare at [facility].  CURRENT THERAPY: Maintenance systemic chemotherapy with Alimta 500 MG/M2 every 3 weeks, status post 190 cycles.  INTERVAL HISTORY: Danielle [name] 68 y.o. female returns to the clinic today for follow-up visit.  The patient is feeling fine today with no concerning complaints except for mild fatigue.  She denied having any chest pain, shortness of breath, cough or hemoptysis.  She denied having any fever or chills.  She has no nausea, vomiting, diarrhea or constipation.  She has no headache or visual changes.  She continues to tolerate her  maintenance treatment with Alimta fairly well.  She is here today for evaluation before starting cycle #191.  MEDICAL HISTORY: Past Medical History:  Diagnosis Date  . Anemia   . 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 CVA (cerebral vascular accident) ([place]) 04/2014   pt states had 2 cva's within 2 wks  . DJD (degenerative joint disease), cervical   . Fibromyalgia   . H/O: pneumonia   . History of tobacco abuse quit 10/08   on nicotine patch  . Hypertension   . Hypokalemia   . Lung cancer ([place]) dx'd 09/2005   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 had chemo and radiation for lung ca  . Thrush 12/11/2010    ALLERGIES:  is allergic to contrast media [iodinated diagnostic agents]; iohexol; sulfa drugs cross reactors; and co-trimoxazole injection [sulfamethoxazole-trimethoprim].  MEDICATIONS:  Current Outpatient Medications  Medication Sig Dispense Refill  . amLODipine (NORVASC) 10 MG tablet TAKE 1 TABLET (10 MG TOTAL) BY MOUTH DAILY. 90 tablet 1  . aspirin 325 MG EC tablet TAKE 1 TABLET BY MOUTH EVERY DAY 90 tablet 0  . atorvastatin (LIPITOR) 40 MG tablet TAKE 1 TABLET BY MOUTH EVERY DAY AT 6 PM 90 tablet 0  . FeFum-FePoly-FA-B Cmp-C-Biot (INTEGRA PLUS) CAPS Take 1 capsule by mouth every morning. 90 capsule 3  . fluticasone (FLONASE) 50 MCG/ACT nasal spray Place 1-2 sprays into both nostrils daily as needed. 16 g 5  . folic acid (FOLVITE) 1 MG tablet Take 1 tablet (1 mg total) by mouth daily. 90 tablet 0  . furosemide (LASIX) 20 MG tablet TAKE 1 TABLET BY MOUTH EVERY DAY AS NEEDED FOR SWELLING 20  tablet 1  . lacosamide (VIMPAT) 50 MG TABS tablet Take by mouth.    . lidocaine-prilocaine (EMLA) cream APPLY TO PORT-A-CATH AS DIRECTED 1-2 HOURS PRIOR TO CHEMOTHERAPY 30 g 1  . lisinopril (PRINIVIL,ZESTRIL) 20 MG tablet TAKE 2 TABLETS (40 MG TOTAL) BY MOUTH DAILY. 180 tablet 3  . morphine (MS CONTIN) 60 MG 12 hr tablet Take 1 tablet (60 mg total) by mouth 2 (two) times  daily. 60 tablet 0  . morphine (MSIR) 30 MG tablet Take 1 tablet (30 mg total) by mouth every 4 (four) hours as needed (breakthrough pain). 60 tablet 0  . Olopatadine HCl 0.2 % SOLN INSTILL 1 DROP INTO AFFECTED EYE(S) EVERY DAY 2.5 mL 1  . ondansetron (ZOFRAN) 8 MG tablet TAKE 1 TABLET BY MOUTH EVERY 8 HOURS AS NEEDED FOR NAUSEA AND VOMITING 30 tablet 0  . triamcinolone (KENALOG) 0.025 % ointment Apply 1 application topically 2 (two) times daily. 30 g 0  . diphenhydrAMINE (BENADRYL) 25 MG tablet Take 1 tablet (25 mg total) by mouth as directed. 2 hours before CT scan. (Patient not taking: Reported on 01/27/2018) 5 tablet 0  . predniSONE (DELTASONE) 50 MG tablet 1 tablet by mouth 13 hours ,7 hours and 1 hour  before CT scan (Patient not taking: Reported on 08/25/2018) 3 tablet 4   No current facility-administered medications for this visit.     SURGICAL HISTORY:  Past Surgical History:  Procedure Laterality Date  . CERVICAL LAMINECTOMY  1995  . KNEE SURGERY  1990   Left x 2  . 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 LOOP RECORDER IMPLANT N/A 04/19/2014   Procedure: LOOP RECORDER IMPLANT;  Surgeon: Thompson Grayer, MD;  Location: [facility] CATH LAB;  Service: Cardiovascular;  Laterality: N/A;  . OTHER SURGICAL HISTORY  2008   Gamma knife surgery to remove brain met   . PORTACATH PLACEMENT  -[name]   TIP IN CAVOATRIAL JUNCTION  . TEE WITHOUT CARDIOVERSION N/A 04/19/2014   Procedure: TRANSESOPHAGEAL ECHOCARDIOGRAM (TEE);  Surgeon: Larey Dresser, MD;  Location: [facility];  Service: Cardiovascular;  Laterality: N/A;    REVIEW OF SYSTEMS:  A comprehensive review of systems was negative except for: Constitutional: positive for fatigue   PHYSICAL EXAMINATION: General appearance: alert, cooperative and no distress Head: Normocephalic, without obvious abnormality, atraumatic Neck: no adenopathy, no JVD, supple, symmetrical, trachea midline and thyroid not enlarged,  symmetric, no tenderness/mass/nodules Lymph nodes: Cervical, supraclavicular, and axillary nodes normal. Resp: clear to auscultation bilaterally Back: symmetric, no curvature. ROM normal. No CVA tenderness. Cardio: regular rate and rhythm, S1, S2 normal, no murmur, click, rub or gallop GI: soft, non-tender; bowel sounds normal; no masses,  no organomegaly Extremities: extremities normal, atraumatic, no cyanosis or edema  ECOG PERFORMANCE STATUS: 1 - Symptomatic but completely ambulatory  Blood pressure (!) 166/66, pulse 88, temperature 98.3 F (36.8 C), temperature source Oral, resp. rate 20, height _0  (1.575 m), weight 148 lb 1.6 oz (67.2 kg), SpO2 98 %.  LABORATORY DATA: Lab Results  Component Value Date   WBC 9.8 08/25/2018   HGB 13.3 08/25/2018   HCT 40.3 08/25/2018   MCV 95.7 08/25/2018   PLT 355 08/25/2018      Chemistry      Component Value Date/Time   NA 141 08/04/2018 0852   NA 142 12/24/2016 0816   K 4.8 08/04/2018 0852   K 4.4 12/24/2016 0816   CL 107 08/04/2018 9201  CL 106 06/30/2012 1004   CO2 26 08/04/2018 0852   CO2 28 12/24/2016 0816   BUN 11 08/04/2018 0852   BUN 15.8 12/24/2016 0816   CREATININE 0.79 08/04/2018 0852   CREATININE 0.87 07/22/2017 0826   CREATININE 1.1 12/24/2016 0816      Component Value Date/Time   CALCIUM 9.6 08/04/2018 0852   CALCIUM 9.6 12/24/2016 0816   ALKPHOS 82 08/04/2018 0852   ALKPHOS 80 12/24/2016 0816   AST 22 08/04/2018 0852   AST 20 07/22/2017 0826   AST 20 12/24/2016 0816   ALT 14 08/04/2018 0852   ALT 11 07/22/2017 0826   ALT 11 12/24/2016 0816   BILITOT 0.4 08/04/2018 0852   BILITOT 0.4 07/22/2017 0826   BILITOT 0.34 12/24/2016 0816       RADIOGRAPHIC STUDIES: No results found.  ASSESSMENT AND PLAN: This is a very pleasant 68 years old white female with metastatic non-small cell lung cancer diagnosed in October 2007 status post concurrent chemoradiation followed by consolidation chemotherapy and she  is currently on maintenance treatment with single agent Alimta status post 190 cycles. The patient has been tolerating this treatment well with no concerning adverse effects. I recommended for her to proceed with cycle #191 today as planned. I will see her back for follow-up visit in 3 weeks for evaluation before starting cycle #192. For hypertension, she was advised to take her blood pressure medication as prescribed and to monitor it closely at home.  The patient mentions that her blood pressure at home is usually within the normal range. She was advised to call immediately if she has any other concerning symptoms in the interval. The patient voices understanding of current disease status and treatment options and is in agreement with the current care plan. All questions were answered. The patient knows to call the clinic with any problems, questions or concerns. We can certainly see the patient much sooner if necessary.  Disclaimer: This note was dictated with voice recognition software. Similar sounding words can inadvertently be transcribed and may not be corrected upon review.

## 2018-08-25 NOTE — Patient Instructions (Signed)
[facility] Discharge Instructions for Patients Receiving Chemotherapy  Today you received the following chemotherapy agents Pemetrexed (ALIMTA).  To help prevent nausea and vomiting after your treatment, we encourage you to take your nausea medication as prescribed.   If you develop nausea and vomiting that is not controlled by your nausea medication, call the clinic.   BELOW ARE SYMPTOMS THAT SHOULD BE REPORTED IMMEDIATELY:  *FEVER GREATER THAN 100.5 F  *CHILLS WITH OR WITHOUT FEVER  NAUSEA AND VOMITING THAT IS NOT CONTROLLED WITH YOUR NAUSEA MEDICATION  *UNUSUAL SHORTNESS OF BREATH  *UNUSUAL BRUISING OR BLEEDING  TENDERNESS IN MOUTH AND THROAT WITH OR WITHOUT PRESENCE OF ULCERS  *URINARY PROBLEMS  *BOWEL PROBLEMS  UNUSUAL RASH Items with * indicate a potential emergency and should be followed up as soon as possible.  Feel free to call the clinic should you have any questions or concerns. The clinic phone number is [phone].  Please show the [place] at check-in to the Emergency Department and triage nurse.  Coronavirus (COVID-19) Are you at risk?  Are you at risk for the Coronavirus (COVID-19)?  To be considered HIGH RISK for Coronavirus (COVID-19), you have to meet the following criteria:  . Traveled to [place], [place], [place], [place] or [place]; or in the [place] to [place], [place], [place], or [place]; and have fever, cough, and shortness of breath within the last 2 weeks of travel OR . Been in close contact with a person diagnosed with COVID-19 within the last 2 weeks and have fever, cough, and shortness of breath . IF YOU DO NOT MEET THESE CRITERIA, YOU ARE CONSIDERED LOW RISK FOR COVID-19.  What to do if you are HIGH RISK for COVID-19?  Marland Kitchen If you are having a medical emergency, call 911. . Seek medical care right away. Before you go to a doctor's office, urgent care or emergency department, call ahead and tell them  about your recent travel, contact with someone diagnosed with COVID-19, and your symptoms. You should receive instructions from your physician's office regarding next steps of care.  . When you arrive at healthcare provider, tell the healthcare staff immediately you have returned from visiting [place], [place], [place], [place] or [place]; or traveled in the [place] to [place], [place], [place], or [place]; in the last two weeks or you have been in close contact with a person diagnosed with COVID-19 in the last 2 weeks.   . Tell the health care staff about your symptoms: fever, cough and shortness of breath. . After you have been seen by a medical provider, you will be either: o Tested for (COVID-19) and discharged home on quarantine except to seek medical care if symptoms worsen, and asked to  - Stay home and avoid contact with others until you get your results (4-5 days)  - Avoid travel on public transportation if possible (such as bus, train, or airplane) or o Sent to the Emergency Department by EMS for evaluation, COVID-19 testing, and possible admission depending on your condition and test results.  What to do if you are LOW RISK for COVID-19?  Reduce your risk of any infection by using the same precautions used for avoiding the common cold or flu:  Marland Kitchen Wash your hands often with soap and warm water for at least 20 seconds.  If soap and water are not readily available, use an alcohol-based hand sanitizer with at least 60% alcohol.  . If coughing or  sneezing, cover your mouth and nose by coughing or sneezing into the elbow areas of your shirt or coat, into a tissue or into your sleeve (not your hands). . Avoid shaking hands with others and consider head nods or verbal greetings only. . Avoid touching your eyes, nose, or mouth with unwashed hands.  . Avoid close contact with people who are sick. . Avoid places or events with large numbers of people in one location, like concerts or  sporting events. . Carefully consider travel plans you have or are making. . If you are planning any travel outside or inside the [place], visit the CDC's Travelers' Health webpage for the latest health notices. . If you have some symptoms but not all symptoms, continue to monitor at home and seek medical attention if your symptoms worsen. . If you are having a medical emergency, call 911.   [place] / e-Visit: [email or web address]         [facility]: [facility]: [phone]                   [facility]: [phone]

## 2018-08-25 NOTE — Telephone Encounter (Signed)
Treatment plan already scheduled per 08/18 los during check out.

## 2018-08-28 ENCOUNTER — Encounter: Payer: Self-pay | Admitting: Family Medicine

## 2018-08-28 ENCOUNTER — Ambulatory Visit (INDEPENDENT_AMBULATORY_CARE_PROVIDER_SITE_OTHER): Payer: Medicare Other | Admitting: Family Medicine

## 2018-08-28 ENCOUNTER — Other Ambulatory Visit: Payer: Self-pay

## 2018-08-28 VITALS — BP 110/60 | HR 74 | Temp 97.7°F | Ht 62.5 in | Wt 147.8 lb

## 2018-08-28 DIAGNOSIS — I700 Atherosclerosis of aorta: Secondary | ICD-10-CM | POA: Diagnosis not present

## 2018-08-28 DIAGNOSIS — I10 Essential (primary) hypertension: Secondary | ICD-10-CM

## 2018-08-28 DIAGNOSIS — E119 Type 2 diabetes mellitus without complications: Secondary | ICD-10-CM

## 2018-08-28 DIAGNOSIS — C3490 Malignant neoplasm of unspecified part of unspecified bronchus or lung: Secondary | ICD-10-CM

## 2018-08-28 DIAGNOSIS — Z0000 Encounter for general adult medical examination without abnormal findings: Secondary | ICD-10-CM | POA: Diagnosis not present

## 2018-08-28 DIAGNOSIS — Z23 Encounter for immunization: Secondary | ICD-10-CM | POA: Diagnosis not present

## 2018-08-28 DIAGNOSIS — C3411 Malignant neoplasm of upper lobe, right bronchus or lung: Secondary | ICD-10-CM | POA: Diagnosis not present

## 2018-08-28 DIAGNOSIS — L989 Disorder of the skin and subcutaneous tissue, unspecified: Secondary | ICD-10-CM

## 2018-08-28 LAB — HM DIABETES FOOT EXAM

## 2018-08-28 NOTE — Assessment & Plan Note (Signed)
Stable per ONC

## 2018-08-28 NOTE — Assessment & Plan Note (Signed)
Active chemo treatment per Dr. Mora Appl

## 2018-08-28 NOTE — Assessment & Plan Note (Signed)
Diet controlled.  

## 2018-08-28 NOTE — Assessment & Plan Note (Signed)
On statin.

## 2018-08-28 NOTE — Assessment & Plan Note (Signed)
New lesion... refer back to derm for removal. Encouraged pt to go early rather than late for best result.

## 2018-08-28 NOTE — Assessment & Plan Note (Signed)
Well controlled. Continue current medication. Encouraged exercise, weight loss, healthy eating habits.  

## 2018-08-28 NOTE — Progress Notes (Signed)
Chief Complaint  Patient presents with  . Medicare Wellness    History of Present Illness: The patient presents for annual medicare wellness, complete physical and review of chronic health problems. He/She also has the following acute concerns today:  I have personally reviewed the Medicare Annual Wellness questionnaire and have noted 1. The patient's medical and social history 2. Their use of alcohol, tobacco or illicit drugs 3. Their current medications and supplements 4. The patient's functional ability including ADL's, fall risks, home safety risks and hearing or visual             impairment. 5. Diet and physical activities 6. Evidence for depression or mood disorders 7.         Updated provider list Cognitive evaluation was performed and recorded on pt medicare questionnaire form. The patients weight, height, BMI and visual acuity have been recorded in the chart  I have made referrals, counseling and provided education to the patient based review of the above and I have provided the pt with a written personalized care plan for preventive services.   Documentation of this information was scanned into the electronic record under the media tab.   Advance directives and end of life planning reviewed in detail with patient and documented in EMR. Patient given handout on advance care directives if needed. HCPOA and living will updated if needed.   Hearing Screening   Method: Audiometry   125Hz  250Hz  500Hz  1000Hz  2000Hz  3000Hz  4000Hz  6000Hz  8000Hz   Right ear:   20 20 20  20     Left ear:   20 20 20  20       Visual Acuity Screening   Right eye Left eye Both eyes  Without correction:     With correction: 20/40 20/20 20/15    Depression screen [facility] 2/9 08/28/2018 08/05/2017 08/09/2016  Decreased Interest 0 0 0  Down, Depressed, Hopeless 0 0 0  PHQ - 2 Score 0 0 0  Altered sleeping - 0 -  Tired, decreased energy - 0 -  Change in appetite - 0 -  Feeling bad or failure about yourself   - 0 -  Trouble concentrating - 0 -  Moving slowly or fidgety/restless - 0 -  Suicidal thoughts - 0 -  PHQ-9 Score - 0 -  Difficult doing work/chores - Not difficult at all -  Some recent data might be hidden   Fall Risk  08/28/2018 08/05/2017 08/09/2016 05/08/2016 07/27/2015  Falls in the past year? 0 No No No No   Metastatic lung cancer, mets to brain and bone. Metastatic non-small cell lung cancer initially diagnosed as locally advanced stage IIIB with a right Pancoast tumor involving the vertebral body as well as prominent canal invasion with spinal cord compression in October 2007. The patient also has metastatic disease to the brain in April 2008.  Currently undergoing chemotherapy. Treatment 191 Followed by Dr. Mora Appl.  Diabetes:   Stale control on no med. Lab Results  Component Value Date   HGBA1C 5.8 08/20/2018  Using medications without difficulties: Hypoglycemic episodes: Hyperglycemic episodes: Feet problems: no ulcers Blood Sugars averaging: not checking eye exam within last year: due  Elevated Cholesterol:  LDL at goal < 100 on atorvastatin 40 mg daily. Lab Results  Component Value Date   CHOL 143 08/20/2018   HDL 47.00 08/20/2018   LDLCALC 68 08/20/2018   LDLDIRECT 105.5 08/06/2013   TRIG 137.0 08/20/2018   CHOLHDL 3 08/20/2018  Using medications without problems: Muscle aches:  Diet compliance:moderate  Exercise: minimal Other complaints:   Wt Readings from Last 3 Encounters:  08/28/18 147 lb 12 oz (67 kg)  08/25/18 148 lb 1.6 oz (67.2 kg)  08/04/18 148 lb 6.4 oz (67.3 kg)    Body mass index is 26.59 kg/m.   COVID 19 screen No recent travel or known exposure to COVID19 The patient denies respiratory symptoms of COVID 19 at this time.  The importance of social distancing was discussed today.   Review of Systems  Constitutional: Negative for chills and fever.  HENT: Negative for congestion and ear pain.   Eyes: Negative for pain and redness.   Respiratory: Positive for shortness of breath. Negative for cough.         Stable  Cardiovascular: Negative for chest pain, palpitations and leg swelling.  Gastrointestinal: Negative for abdominal pain, blood in stool, constipation, diarrhea, nausea and vomiting.  Genitourinary: Negative for dysuria.  Musculoskeletal: Negative for falls and myalgias.  Skin: Negative for rash.  Neurological: Negative for dizziness.  Psychiatric/Behavioral: Negative for depression. The patient is not nervous/anxious.       Past Medical History:  Diagnosis Date  . Anemia   . 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 CVA (cerebral vascular accident) ([place]) 04/2014   pt states had 2 cva's within 2 wks  . DJD (degenerative joint disease), cervical   . Fibromyalgia   . H/O: pneumonia   . History of tobacco abuse quit 10/08   on nicotine patch  . Hypertension   . Hypokalemia   . Lung cancer ([place]) dx'd 09/2005   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 had chemo and radiation for lung ca  . Thrush 12/11/2010    reports that she quit smoking about 12 years ago. She has a 60.00 pack-year smoking history. She has never used smokeless tobacco. She reports that she does not drink alcohol or use drugs.   Current Outpatient Medications:  .  amLODipine (NORVASC) 10 MG tablet, TAKE 1 TABLET (10 MG TOTAL) BY MOUTH DAILY., Disp: 90 tablet, Rfl: 1 .  aspirin 325 MG EC tablet, TAKE 1 TABLET BY MOUTH EVERY DAY, Disp: 90 tablet, Rfl: 0 .  atorvastatin (LIPITOR) 40 MG tablet, TAKE 1 TABLET BY MOUTH EVERY DAY AT 6 PM, Disp: 90 tablet, Rfl: 0 .  diphenhydrAMINE (BENADRYL) 25 MG tablet, Take 1 tablet (25 mg total) by mouth as directed. 2 hours before CT scan. (Patient not taking: Reported on 01/27/2018), Disp: 5 tablet, Rfl: 0 .  FeFum-FePoly-FA-B Cmp-C-Biot (INTEGRA PLUS) CAPS, Take 1 capsule by mouth every morning., Disp: 90 capsule, Rfl: 3 .  fluticasone (FLONASE) 50 MCG/ACT nasal spray, Place 1-2 sprays into  both nostrils daily as needed., Disp: 16 g, Rfl: 5 .  folic acid (FOLVITE) 1 MG tablet, Take 1 tablet (1 mg total) by mouth daily., Disp: 90 tablet, Rfl: 0 .  furosemide (LASIX) 20 MG tablet, TAKE 1 TABLET BY MOUTH EVERY DAY AS NEEDED FOR SWELLING, Disp: 20 tablet, Rfl: 1 .  lacosamide (VIMPAT) 50 MG TABS tablet, Take by mouth., Disp: , Rfl:  .  lidocaine-prilocaine (EMLA) cream, APPLY TO PORT-A-CATH AS DIRECTED 1-2 HOURS PRIOR TO CHEMOTHERAPY, Disp: 30 g, Rfl: 1 .  lisinopril (PRINIVIL,ZESTRIL) 20 MG tablet, TAKE 2 TABLETS (40 MG TOTAL) BY MOUTH DAILY., Disp: 180 tablet, Rfl: 3 .  morphine (MS CONTIN) 60 MG 12 hr tablet, Take 1 tablet (60 mg total) by mouth 2 (two) times daily., Disp: 60 tablet, Rfl: 0 .  morphine (MSIR) 30  MG tablet, Take 1 tablet (30 mg total) by mouth every 4 (four) hours as needed (breakthrough pain)., Disp: 60 tablet, Rfl: 0 .  Olopatadine HCl 0.2 % SOLN, INSTILL 1 DROP INTO AFFECTED EYE(S) EVERY DAY, Disp: 2.5 mL, Rfl: 1 .  ondansetron (ZOFRAN) 8 MG tablet, TAKE 1 TABLET BY MOUTH EVERY 8 HOURS AS NEEDED FOR NAUSEA AND VOMITING, Disp: 30 tablet, Rfl: 0 .  predniSONE (DELTASONE) 50 MG tablet, 1 tablet by mouth 13 hours ,7 hours and 1 hour  before CT scan (Patient not taking: Reported on 08/25/2018), Disp: 3 tablet, Rfl: 4 .  triamcinolone (KENALOG) 0.025 % ointment, Apply 1 application topically 2 (two) times daily., Disp: 30 g, Rfl: 0   Observations/Objective: Blood pressure 110/60, pulse 74, temperature 97.7 F (36.5 C), temperature source Temporal, height 5' 2.5" (1.588 m), weight 147 lb 12 oz (67 kg), SpO2 97 %.  Physical Exam Constitutional:      General: She is not in acute distress.    Appearance: Normal appearance. She is well-developed. She is not ill-appearing or toxic-appearing.  HENT:     Head: Normocephalic.     Right Ear: Hearing, tympanic membrane, ear canal and external ear normal. Tympanic membrane is not erythematous, retracted or bulging.     Left Ear:  Hearing, tympanic membrane, ear canal and external ear normal. Tympanic membrane is not erythematous, retracted or bulging.     Nose: No mucosal edema or rhinorrhea.     Right Sinus: No maxillary sinus tenderness or frontal sinus tenderness.     Left Sinus: No maxillary sinus tenderness or frontal sinus tenderness.     Mouth/Throat:     Pharynx: Uvula midline.  Eyes:     General: Lids are normal. Lids are everted, no foreign bodies appreciated.     Conjunctiva/sclera: Conjunctivae normal.     Pupils: Pupils are equal, round, and reactive to light.  Neck:     Musculoskeletal: Normal range of motion and neck supple.     Thyroid: No thyroid mass or thyromegaly.     Vascular: No carotid bruit.     Trachea: Trachea normal.  Cardiovascular:     Rate and Rhythm: Normal rate and regular rhythm.     Pulses: Normal pulses.     Heart sounds: Normal heart sounds, S1 normal and S2 normal. No murmur. No friction rub. No gallop.   Pulmonary:     Effort: Pulmonary effort is normal. No tachypnea or respiratory distress.     Breath sounds: Normal breath sounds. No decreased breath sounds, wheezing, rhonchi or rales.  Abdominal:     General: Bowel sounds are normal.     Palpations: Abdomen is soft.     Tenderness: There is no abdominal tenderness.  Skin:    General: Skin is warm and dry.     Findings: No rash.     Comments:  Lesion on central forehead... concerning for squamous cell  Neurological:     Mental Status: She is alert.  Psychiatric:        Mood and Affect: Mood is not anxious or depressed.        Speech: Speech normal.        Behavior: Behavior normal. Behavior is cooperative.        Thought Content: Thought content normal.        Judgment: Judgment normal.      Diabetic foot exam: Normal inspection No skin breakdown No calluses  Normal DP pulses Normal sensation to light touch  and monofilament Nails normal  Assessment and Plan The patient's preventative maintenance and  recommended screening tests for an annual wellness exam were reviewed in full today. Brought up to date unless services declined.  Counselled on the importance of diet, exercise, and its role in overall health and mortality. The patient's FH and SH was reviewed, including their home life, tobacco status, and drug and alcohol status.    Will get flu shot at chemo. Today give prevnar  Hep C screening - completed HIV screening - completed A1C - completed Mammogram/ breast exam- declined PAP smear - declined Shingles - contraindicated Eye exam - pt reported exam inJune 2018 Colonoscopy: declined   Atherosclerosis of aorta On statin.  Essential hypertension Well controlled. Continue current medication. Encouraged exercise, weight loss, healthy eating habits.   Cancer of upper lobe of right lung Active chemo treatment per Dr. Mora Appl  Lung cancer, primary, with metastasis from lung to other site, unspecified laterality ([place])  Stable per ONC  Diabetes mellitus with no complication Diet controlled.  Skin lesion of face New lesion... refer back to derm for removal. Encouraged pt to go early rather than late for best result.     [name], MD

## 2018-08-28 NOTE — Patient Instructions (Addendum)
Set up yearly eye exam when able.  Get back to regular exercise, work on healthy eating.  Follow up with dermatology for skin lesion on forehead.

## 2018-09-01 ENCOUNTER — Other Ambulatory Visit: Payer: Self-pay | Admitting: Internal Medicine

## 2018-09-01 DIAGNOSIS — C7931 Secondary malignant neoplasm of brain: Secondary | ICD-10-CM

## 2018-09-01 DIAGNOSIS — C3411 Malignant neoplasm of upper lobe, right bronchus or lung: Secondary | ICD-10-CM

## 2018-09-15 ENCOUNTER — Inpatient Hospital Stay (HOSPITAL_BASED_OUTPATIENT_CLINIC_OR_DEPARTMENT_OTHER): Payer: Medicare Other | Admitting: Internal Medicine

## 2018-09-15 ENCOUNTER — Telehealth: Payer: Self-pay | Admitting: Internal Medicine

## 2018-09-15 ENCOUNTER — Other Ambulatory Visit: Payer: Self-pay

## 2018-09-15 ENCOUNTER — Encounter: Payer: Self-pay | Admitting: Internal Medicine

## 2018-09-15 ENCOUNTER — Inpatient Hospital Stay: Payer: Medicare Other

## 2018-09-15 ENCOUNTER — Inpatient Hospital Stay: Payer: Medicare Other | Attending: Internal Medicine

## 2018-09-15 VITALS — BP 147/57 | HR 90 | Temp 98.2°F | Resp 20 | Ht 62.5 in | Wt 151.9 lb

## 2018-09-15 DIAGNOSIS — M47892 Other spondylosis, cervical region: Secondary | ICD-10-CM | POA: Diagnosis not present

## 2018-09-15 DIAGNOSIS — Z923 Personal history of irradiation: Secondary | ICD-10-CM | POA: Insufficient documentation

## 2018-09-15 DIAGNOSIS — C3410 Malignant neoplasm of upper lobe, unspecified bronchus or lung: Secondary | ICD-10-CM | POA: Diagnosis not present

## 2018-09-15 DIAGNOSIS — C7931 Secondary malignant neoplasm of brain: Secondary | ICD-10-CM | POA: Insufficient documentation

## 2018-09-15 DIAGNOSIS — C3411 Malignant neoplasm of upper lobe, right bronchus or lung: Secondary | ICD-10-CM | POA: Diagnosis not present

## 2018-09-15 DIAGNOSIS — Z23 Encounter for immunization: Secondary | ICD-10-CM | POA: Diagnosis not present

## 2018-09-15 DIAGNOSIS — Z79899 Other long term (current) drug therapy: Secondary | ICD-10-CM | POA: Diagnosis not present

## 2018-09-15 DIAGNOSIS — Z7982 Long term (current) use of aspirin: Secondary | ICD-10-CM | POA: Diagnosis not present

## 2018-09-15 DIAGNOSIS — Z5111 Encounter for antineoplastic chemotherapy: Secondary | ICD-10-CM

## 2018-09-15 DIAGNOSIS — Z87891 Personal history of nicotine dependence: Secondary | ICD-10-CM | POA: Insufficient documentation

## 2018-09-15 DIAGNOSIS — C3490 Malignant neoplasm of unspecified part of unspecified bronchus or lung: Secondary | ICD-10-CM

## 2018-09-15 DIAGNOSIS — M797 Fibromyalgia: Secondary | ICD-10-CM | POA: Insufficient documentation

## 2018-09-15 DIAGNOSIS — I10 Essential (primary) hypertension: Secondary | ICD-10-CM | POA: Diagnosis not present

## 2018-09-15 DIAGNOSIS — Z8673 Personal history of transient ischemic attack (TIA), and cerebral infarction without residual deficits: Secondary | ICD-10-CM | POA: Insufficient documentation

## 2018-09-15 LAB — COMPREHENSIVE METABOLIC PANEL
ALT: 13 U/L (ref 0–44)
AST: 22 U/L (ref 15–41)
Albumin: 3.8 g/dL (ref 3.5–5.0)
Alkaline Phosphatase: 76 U/L (ref 38–126)
Anion gap: 8 (ref 5–15)
BUN: 12 mg/dL (ref 8–23)
CO2: 28 mmol/L (ref 22–32)
Calcium: 9.6 mg/dL (ref 8.9–10.3)
Chloride: 104 mmol/L (ref 98–111)
Creatinine, Ser: 0.85 mg/dL (ref 0.44–1.00)
GFR calc Af Amer: >60 mL/min (ref >60)
GFR calc non Af Amer: >60 mL/min (ref >60)
Glucose, Bld: 105 mg/dL — ABNORMAL HIGH (ref 70–99)
Potassium: 4.4 mmol/L (ref 3.5–5.1)
Sodium: 140 mmol/L (ref 135–145)
Total Bilirubin: 0.3 mg/dL (ref 0.3–1.2)
Total Protein: 7.2 g/dL (ref 6.5–8.1)

## 2018-09-15 LAB — CBC WITH DIFFERENTIAL/PLATELET
Abs Immature Granulocytes: 0.07 10*3/uL (ref 0.00–0.07)
Basophils Absolute: 0 10*3/uL (ref 0.0–0.1)
Basophils Relative: 0 %
Eosinophils Absolute: 0.1 10*3/uL (ref 0.0–0.5)
Eosinophils Relative: 1 %
HCT: 40.5 % (ref 36.0–46.0)
Hemoglobin: 13.2 g/dL (ref 12.0–15.0)
Immature Granulocytes: 1 %
Lymphocytes Relative: 18 %
Lymphs Abs: 2 10*3/uL (ref 0.7–4.0)
MCH: 31.7 pg (ref 26.0–34.0)
MCHC: 32.6 g/dL (ref 30.0–36.0)
MCV: 97.1 fL (ref 80.0–100.0)
Monocytes Absolute: 1 10*3/uL (ref 0.1–1.0)
Monocytes Relative: 10 %
Neutro Abs: 7.6 10*3/uL (ref 1.7–7.7)
Neutrophils Relative %: 70 %
Platelets: 315 10*3/uL (ref 150–400)
RBC: 4.17 MIL/uL (ref 3.87–5.11)
RDW: 13.2 % (ref 11.5–15.5)
WBC: 10.8 10*3/uL — ABNORMAL HIGH (ref 4.0–10.5)
nRBC: 0 % (ref 0.0–0.2)

## 2018-09-15 MED ORDER — DEXAMETHASONE SODIUM PHOSPHATE 10 MG/ML IJ SOLN
10.0000 mg | Freq: Once | INTRAMUSCULAR | Status: AC
  Administered 2018-09-15: 10 mg via INTRAVENOUS

## 2018-09-15 MED ORDER — CYANOCOBALAMIN 1000 MCG/ML IJ SOLN
1000.0000 ug | Freq: Once | INTRAMUSCULAR | Status: AC
  Administered 2018-09-15: 1000 ug via INTRAMUSCULAR

## 2018-09-15 MED ORDER — HEPARIN SOD (PORK) LOCK FLUSH 100 UNIT/ML IV SOLN
500.0000 [IU] | Freq: Once | INTRAVENOUS | Status: AC | PRN
  Administered 2018-09-15: 500 [IU]
  Filled 2018-09-15: qty 5

## 2018-09-15 MED ORDER — SODIUM CHLORIDE 0.9 % IV SOLN
500.0000 mg/m2 | Freq: Once | INTRAVENOUS | Status: AC
  Administered 2018-09-15: 800 mg via INTRAVENOUS
  Filled 2018-09-15: qty 20

## 2018-09-15 MED ORDER — SODIUM CHLORIDE 0.9% FLUSH
10.0000 mL | INTRAVENOUS | Status: DC | PRN
  Administered 2018-09-15: 10 mL
  Filled 2018-09-15: qty 10

## 2018-09-15 MED ORDER — MORPHINE SULFATE ER 60 MG PO TBCR: 60.0000 mg | EXTENDED_RELEASE_TABLET | Freq: Two times a day (BID) | ORAL | 0 refills | Status: DC

## 2018-09-15 MED ORDER — DEXAMETHASONE SODIUM PHOSPHATE 10 MG/ML IJ SOLN
INTRAMUSCULAR | Status: AC
  Filled 2018-09-15: qty 1

## 2018-09-15 MED ORDER — INFLUENZA VAC SPLIT QUAD 0.5 ML IM SUSY
0.5000 mL | PREFILLED_SYRINGE | Freq: Once | INTRAMUSCULAR | Status: AC
  Administered 2018-09-15: 11:00:00 0.5 mL via INTRAMUSCULAR
  Filled 2018-09-15: qty 0.5

## 2018-09-15 MED ORDER — ONDANSETRON HCL 8 MG PO TABS
8.0000 mg | ORAL_TABLET | Freq: Once | ORAL | Status: AC
  Administered 2018-09-15: 11:00:00 8 mg via ORAL

## 2018-09-15 MED ORDER — CYANOCOBALAMIN 1000 MCG/ML IJ SOLN
INTRAMUSCULAR | Status: AC
  Filled 2018-09-15: qty 1

## 2018-09-15 MED ORDER — SODIUM CHLORIDE 0.9 % IV SOLN
Freq: Once | INTRAVENOUS | Status: AC
  Administered 2018-09-15: 11:00:00 via INTRAVENOUS
  Filled 2018-09-15: qty 250

## 2018-09-15 MED ORDER — ONDANSETRON HCL 8 MG PO TABS
ORAL_TABLET | ORAL | Status: AC
  Filled 2018-09-15: qty 1

## 2018-09-15 NOTE — Telephone Encounter (Signed)
Scheduled appt per 9/8 los - pt aware of appts added - called to treatment area while in scheduling . Pt states she will get a print out back there.

## 2018-09-15 NOTE — Progress Notes (Signed)
[name] Telephone:([phone]) [phone]   [phone]  OFFICE PROGRESS NOTE  [name], MD [place] [address]  DIAGNOSIS: Metastatic non-small cell lung cancer initially diagnosed as locally advanced stage IIIB with a right Pancoast tumor involving the vertebral body as well as prominent canal invasion with spinal cord compression in October 2007. The patient also has metastatic disease to the brain in April 2008.  PRIOR THERAPY: 1. Status post concurrent chemoradiation with weekly carboplatin and paclitaxel, last dose was given November 18, 2005. 2. 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 3. Status post gamma knife radiotherapy to a solitary brain lesion located in the superior frontal area of the brain at [facility] in April of 2008. 4. Status post palliative radiotherapy to the lateral abdominal wall metastatic lesion under the care of Dr. Lisbeth Renshaw, completed March of 2009. 5. Status post 6 cycles of systemic chemotherapy with carboplatin and Alimta. Last dose was given July 26, 2007 with disease stabilization. 6. Gamma knife stereotactic radiotherapy to 2 brain lesions one involving the right frontal dural based as well as right parietal lesion performed on 05/07/2012 under the care of Dr. Vallarie Mare at [facility].  CURRENT THERAPY: Maintenance systemic chemotherapy with Alimta 500 MG/M2 every 3 weeks, status post 191 cycles.  INTERVAL HISTORY: Danielle [name] 68 y.o. female returns to the clinic today for follow-up visit.  The patient is feeling fine today with no concerning complaints.  She denied having any chest pain, shortness of breath, cough or hemoptysis.  She denied having any fever or chills.  She has no nausea, vomiting, diarrhea or constipation.  She has no headache or visual changes.  She continues to tolerate her maintenance treatment  with Alimta fairly well.  She is here for evaluation before starting cycle #192.  MEDICAL HISTORY: Past Medical History:  Diagnosis Date  . Anemia   . 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 CVA (cerebral vascular accident) ([place]) 04/2014   pt states had 2 cva's within 2 wks  . DJD (degenerative joint disease), cervical   . Fibromyalgia   . H/O: pneumonia   . History of tobacco abuse quit 10/08   on nicotine patch  . Hypertension   . Hypokalemia   . Lung cancer ([place]) dx'd 09/2005   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 had chemo and radiation for lung ca  . Thrush 12/11/2010    ALLERGIES:  is allergic to contrast media [iodinated diagnostic agents]; iohexol; sulfa drugs cross reactors; and co-trimoxazole injection [sulfamethoxazole-trimethoprim].  MEDICATIONS:  Current Outpatient Medications  Medication Sig Dispense Refill  . amLODipine (NORVASC) 10 MG tablet TAKE 1 TABLET (10 MG TOTAL) BY MOUTH DAILY. 90 tablet 1  . aspirin 325 MG EC tablet TAKE 1 TABLET BY MOUTH EVERY DAY 90 tablet 0  . atorvastatin (LIPITOR) 40 MG tablet TAKE 1 TABLET BY MOUTH EVERY DAY AT 6 PM 90 tablet 0  . diphenhydrAMINE (BENADRYL) 25 MG tablet Take 1 tablet (25 mg total) by mouth as directed. 2 hours before CT scan. (Patient not taking: Reported on 01/27/2018) 5 tablet 0  . FeFum-FePoly-FA-B Cmp-C-Biot (INTEGRA PLUS) CAPS Take 1 capsule by mouth every morning. 90 capsule 3  . fluticasone (FLONASE) 50 MCG/ACT nasal spray Place 1-2 sprays into both nostrils daily as needed. 16 g 5  . folic acid (FOLVITE) 1 MG tablet Take 1 tablet (1 mg  total) by mouth daily. 90 tablet 0  . furosemide (LASIX) 20 MG tablet TAKE 1 TABLET BY MOUTH EVERY DAY AS NEEDED FOR SWELLING 20 tablet 1  . lacosamide (VIMPAT) 50 MG TABS tablet Take by mouth.    . lidocaine-prilocaine (EMLA) cream APPLY TO PORT-A-CATH AS DIRECTED 1-2 HOURS PRIOR TO CHEMOTHERAPY 30 g 1  . lisinopril (PRINIVIL,ZESTRIL) 20 MG tablet TAKE 2  TABLETS (40 MG TOTAL) BY MOUTH DAILY. 180 tablet 3  . morphine (MS CONTIN) 60 MG 12 hr tablet Take 1 tablet (60 mg total) by mouth 2 (two) times daily. 60 tablet 0  . morphine (MSIR) 30 MG tablet Take 1 tablet (30 mg total) by mouth every 4 (four) hours as needed (breakthrough pain). 60 tablet 0  . Olopatadine HCl 0.2 % SOLN INSTILL 1 DROP INTO AFFECTED EYE(S) EVERY DAY 2.5 mL 1  . ondansetron (ZOFRAN) 8 MG tablet TAKE 1 TABLET BY MOUTH EVERY 8 HOURS AS NEEDED FOR NAUSEA AND VOMITING 30 tablet 0  . predniSONE (DELTASONE) 50 MG tablet 1 tablet by mouth 13 hours ,7 hours and 1 hour  before CT scan (Patient not taking: Reported on 08/25/2018) 3 tablet 4  . triamcinolone (KENALOG) 0.025 % ointment Apply 1 application topically 2 (two) times daily. 30 g 0   No current facility-administered medications for this visit.     SURGICAL HISTORY:  Past Surgical History:  Procedure Laterality Date  . CERVICAL LAMINECTOMY  1995  . KNEE SURGERY  1990   Left x 2  . 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 LOOP RECORDER IMPLANT N/A 04/19/2014   Procedure: LOOP RECORDER IMPLANT;  Surgeon: Thompson Grayer, MD;  Location[facility] CATH LAB;  Service: Cardiovascular;  Laterality: N/A;  . OTHER SURGICAL HISTORY  2008   Gamma knife surgery to remove brain met   . PORTACATH PLACEMENT  -[name]   TIP IN CAVOATRIAL JUNCTION  . TEE WITHOUT CARDIOVERSION N/A 04/19/2014   Procedure: TRANSESOPHAGEAL ECHOCARDIOGRAM (TEE);  Surgeon: Larey Dresser, MD;  Location: [facility] ENDOSCOPY;  Service: Cardiovascular;  Laterality: N/A;    REVIEW OF SYSTEMS:  A comprehensive review of systems was negative.   PHYSICAL EXAMINATION: General appearance: alert, cooperative and no distress Head: Normocephalic, without obvious abnormality, atraumatic Neck: no adenopathy, no JVD, supple, symmetrical, trachea midline and thyroid not enlarged, symmetric, no tenderness/mass/nodules Lymph nodes: Cervical, supraclavicular, and  axillary nodes normal. Resp: clear to auscultation bilaterally Back: symmetric, no curvature. ROM normal. No CVA tenderness. Cardio: regular rate and rhythm, S1, S2 normal, no murmur, click, rub or gallop GI: soft, non-tender; bowel sounds normal; no masses,  no organomegaly Extremities: extremities normal, atraumatic, no cyanosis or edema  ECOG PERFORMANCE STATUS: 1 - Symptomatic but completely ambulatory  Blood pressure (!) 147/57, pulse 90, temperature 98.2 F (36.8 C), resp. rate 20, height 5' 2.5" (1.588 m), weight 151 lb 14.4 oz (68.9 kg), SpO2 98 %.  LABORATORY DATA: Lab Results  Component Value Date   WBC 10.8 (H) 09/15/2018   HGB 13.2 09/15/2018   HCT 40.5 09/15/2018   MCV 97.1 09/15/2018   PLT 315 09/15/2018      Chemistry      Component Value Date/Time   NA 140 08/25/2018 0909   NA 142 12/24/2016 0816   K 4.0 08/25/2018 0909   K 4.4 12/24/2016 0816   CL 105 08/25/2018 0909   CL 106 06/30/2012 1004   CO2 25 08/25/2018 0909  CO2 28 12/24/2016 0816   BUN 12 08/25/2018 0909   BUN 15.8 12/24/2016 0816   CREATININE 0.77 08/25/2018 0909   CREATININE 0.87 07/22/2017 0826   CREATININE 1.1 12/24/2016 0816      Component Value Date/Time   CALCIUM 9.8 08/25/2018 0909   CALCIUM 9.6 12/24/2016 0816   ALKPHOS 87 08/25/2018 0909   ALKPHOS 80 12/24/2016 0816   AST 26 08/25/2018 0909   AST 20 07/22/2017 0826   AST 20 12/24/2016 0816   ALT 18 08/25/2018 0909   ALT 11 07/22/2017 0826   ALT 11 12/24/2016 0816   BILITOT 0.4 08/25/2018 0909   BILITOT 0.4 07/22/2017 0826   BILITOT 0.34 12/24/2016 0816       RADIOGRAPHIC STUDIES: No results found.  ASSESSMENT AND PLAN: This is a very pleasant 68 years old white female with metastatic non-small cell lung cancer diagnosed in October 2007 status post concurrent chemoradiation followed by consolidation chemotherapy and she is currently on maintenance treatment with single agent Alimta status post 191 cycles. The patient  continues to tolerate her treatment well with no concerning adverse effects. I recommended for her to proceed with cycle #192 today as planned. I will see the patient back for follow-up visit in 3 weeks for evaluation after repeating CT scan of the chest, abdomen and pelvis for restaging of her disease. For pain management I will give her a refill of MS Contin today. She was advised to call immediately if she has any concerning symptoms in the interval. The patient voices understanding of current disease status and treatment options and is in agreement with the current care plan. All questions were answered. The patient knows to call the clinic with any problems, questions or concerns. We can certainly see the patient much sooner if necessary.  Disclaimer: This note was dictated with voice recognition software. Similar sounding words can inadvertently be transcribed and may not be corrected upon review.

## 2018-09-15 NOTE — Patient Instructions (Signed)
[facility] Discharge Instructions for Patients Receiving Chemotherapy  Today you received the following chemotherapy agents Pemetrexed (ALIMTA).  To help prevent nausea and vomiting after your treatment, we encourage you to take your nausea medication as prescribed.   If you develop nausea and vomiting that is not controlled by your nausea medication, call the clinic.   BELOW ARE SYMPTOMS THAT SHOULD BE REPORTED IMMEDIATELY:  *FEVER GREATER THAN 100.5 F  *CHILLS WITH OR WITHOUT FEVER  NAUSEA AND VOMITING THAT IS NOT CONTROLLED WITH YOUR NAUSEA MEDICATION  *UNUSUAL SHORTNESS OF BREATH  *UNUSUAL BRUISING OR BLEEDING  TENDERNESS IN MOUTH AND THROAT WITH OR WITHOUT PRESENCE OF ULCERS  *URINARY PROBLEMS  *BOWEL PROBLEMS  UNUSUAL RASH Items with * indicate a potential emergency and should be followed up as soon as possible.  Feel free to call the clinic should you have any questions or concerns. The clinic phone number is [phone].  Please show the [facility] at check-in to the Emergency Department and triage nurse.  Coronavirus (COVID-19) Are you at risk?  Are you at risk for the Coronavirus (COVID-19)?  To be considered HIGH RISK for Coronavirus (COVID-19), you have to meet the following criteria:  . Traveled to [place], [place], [place], [place] or [place]; or in the [place] to [place], [place], [place], or [place]; and have fever, cough, and shortness of breath within the last 2 weeks of travel OR . Been in close contact with a person diagnosed with COVID-19 within the last 2 weeks and have fever, cough, and shortness of breath . IF YOU DO NOT MEET THESE CRITERIA, YOU ARE CONSIDERED LOW RISK FOR COVID-19.  What to do if you are HIGH RISK for COVID-19?  Marland Kitchen If you are having a medical emergency, call 911. . Seek medical care right away. Before you go to a doctor's office, urgent care or emergency department, call ahead and tell them  about your recent travel, contact with someone diagnosed with COVID-19, and your symptoms. You should receive instructions from your physician's office regarding next steps of care.  . When you arrive at healthcare provider, tell the healthcare staff immediately you have returned from visiting [place], [place], [place], [place] or [place]; or traveled in the [place] to [place], [place], [place], or [place]; in the last two weeks or you have been in close contact with a person diagnosed with COVID-19 in the last 2 weeks.   . Tell the health care staff about your symptoms: fever, cough and shortness of breath. . After you have been seen by a medical provider, you will be either: o Tested for (COVID-19) and discharged home on quarantine except to seek medical care if symptoms worsen, and asked to  - Stay home and avoid contact with others until you get your results (4-5 days)  - Avoid travel on public transportation if possible (such as bus, train, or airplane) or o Sent to the Emergency Department by EMS for evaluation, COVID-19 testing, and possible admission depending on your condition and test results.  What to do if you are LOW RISK for COVID-19?  Reduce your risk of any infection by using the same precautions used for avoiding the common cold or flu:  Marland Kitchen Wash your hands often with soap and warm water for at least 20 seconds.  If soap and water are not readily available, use an alcohol-based hand sanitizer with at least 60% alcohol.  . If coughing or  sneezing, cover your mouth and nose by coughing or sneezing into the elbow areas of your shirt or coat, into a tissue or into your sleeve (not your hands). . Avoid shaking hands with others and consider head nods or verbal greetings only. . Avoid touching your eyes, nose, or mouth with unwashed hands.  . Avoid close contact with people who are sick. . Avoid places or events with large numbers of people in one location, like concerts or  sporting events. . Carefully consider travel plans you have or are making. . If you are planning any travel outside or inside the [place], visit the CDC's Travelers' Health webpage for the latest health notices. . If you have some symptoms but not all symptoms, continue to monitor at home and seek medical attention if your symptoms worsen. . If you are having a medical emergency, call 911.   [facility] / e-Visit: [email or web address]         [facility]: [facility]: [phone]                   [facility]: [phone]

## 2018-09-16 ENCOUNTER — Other Ambulatory Visit: Payer: Self-pay | Admitting: Family Medicine

## 2018-09-16 ENCOUNTER — Other Ambulatory Visit: Payer: Self-pay | Admitting: Internal Medicine

## 2018-09-16 DIAGNOSIS — C3411 Malignant neoplasm of upper lobe, right bronchus or lung: Secondary | ICD-10-CM

## 2018-09-24 ENCOUNTER — Other Ambulatory Visit: Payer: Self-pay

## 2018-09-24 MED ORDER — AMLODIPINE BESYLATE 10 MG PO TABS: ORAL_TABLET | ORAL | 3 refills | Status: DC

## 2018-10-02 ENCOUNTER — Ambulatory Visit (HOSPITAL_COMMUNITY)
Admission: RE | Admit: 2018-10-02 | Discharge: 2018-10-02 | Disposition: A | Discharge status: Home / Self Care | Payer: Medicare Other | Source: Ambulatory Visit | Attending: Internal Medicine | Admitting: Internal Medicine

## 2018-10-02 ENCOUNTER — Other Ambulatory Visit: Payer: Self-pay

## 2018-10-02 ENCOUNTER — Encounter (HOSPITAL_COMMUNITY): Payer: Self-pay

## 2018-10-02 DIAGNOSIS — N2889 Other specified disorders of kidney and ureter: Secondary | ICD-10-CM | POA: Diagnosis not present

## 2018-10-02 DIAGNOSIS — C3490 Malignant neoplasm of unspecified part of unspecified bronchus or lung: Secondary | ICD-10-CM | POA: Insufficient documentation

## 2018-10-02 DIAGNOSIS — J439 Emphysema, unspecified: Secondary | ICD-10-CM | POA: Diagnosis not present

## 2018-10-02 MED ORDER — HEPARIN SOD (PORK) LOCK FLUSH 100 UNIT/ML IV SOLN
500.0000 [IU] | Freq: Once | INTRAVENOUS | Status: AC
  Administered 2018-10-02: 500 [IU]

## 2018-10-02 MED ORDER — HEPARIN SOD (PORK) LOCK FLUSH 100 UNIT/ML IV SOLN
INTRAVENOUS | Status: AC
  Filled 2018-10-02: qty 5

## 2018-10-02 MED ORDER — IOHEXOL 300 MG/ML  SOLN
100.0000 mL | Freq: Once | INTRAMUSCULAR | Status: AC | PRN
  Administered 2018-10-02: 08:00:00 100 mL via INTRAVENOUS

## 2018-10-02 MED ORDER — SODIUM CHLORIDE (PF) 0.9 % IJ SOLN
INTRAMUSCULAR | Status: AC
  Filled 2018-10-02: qty 50

## 2018-10-06 ENCOUNTER — Inpatient Hospital Stay: Payer: Medicare Other

## 2018-10-06 ENCOUNTER — Inpatient Hospital Stay (HOSPITAL_BASED_OUTPATIENT_CLINIC_OR_DEPARTMENT_OTHER): Payer: Medicare Other | Admitting: Physician Assistant

## 2018-10-06 ENCOUNTER — Encounter: Payer: Self-pay | Admitting: Physician Assistant

## 2018-10-06 ENCOUNTER — Other Ambulatory Visit: Payer: Self-pay

## 2018-10-06 VITALS — BP 139/60 | HR 90 | Temp 98.9°F | Resp 18 | Ht 62.5 in | Wt 150.4 lb

## 2018-10-06 DIAGNOSIS — C3411 Malignant neoplasm of upper lobe, right bronchus or lung: Secondary | ICD-10-CM

## 2018-10-06 DIAGNOSIS — Z95828 Presence of other vascular implants and grafts: Secondary | ICD-10-CM

## 2018-10-06 DIAGNOSIS — Z5111 Encounter for antineoplastic chemotherapy: Secondary | ICD-10-CM

## 2018-10-06 DIAGNOSIS — C3490 Malignant neoplasm of unspecified part of unspecified bronchus or lung: Secondary | ICD-10-CM

## 2018-10-06 DIAGNOSIS — Z23 Encounter for immunization: Secondary | ICD-10-CM | POA: Diagnosis not present

## 2018-10-06 DIAGNOSIS — Z923 Personal history of irradiation: Secondary | ICD-10-CM | POA: Diagnosis not present

## 2018-10-06 DIAGNOSIS — C7931 Secondary malignant neoplasm of brain: Secondary | ICD-10-CM | POA: Diagnosis not present

## 2018-10-06 DIAGNOSIS — Z8673 Personal history of transient ischemic attack (TIA), and cerebral infarction without residual deficits: Secondary | ICD-10-CM | POA: Diagnosis not present

## 2018-10-06 LAB — CBC WITH DIFFERENTIAL/PLATELET
Abs Immature Granulocytes: 0.03 10*3/uL (ref 0.00–0.07)
Basophils Absolute: 0 10*3/uL (ref 0.0–0.1)
Basophils Relative: 0 %
Eosinophils Absolute: 0.1 10*3/uL (ref 0.0–0.5)
Eosinophils Relative: 1 %
HCT: 40.4 % (ref 36.0–46.0)
Hemoglobin: 13 g/dL (ref 12.0–15.0)
Immature Granulocytes: 0 %
Lymphocytes Relative: 24 %
Lymphs Abs: 2.2 10*3/uL (ref 0.7–4.0)
MCH: 31.8 pg (ref 26.0–34.0)
MCHC: 32.2 g/dL (ref 30.0–36.0)
MCV: 98.8 fL (ref 80.0–100.0)
Monocytes Absolute: 1.1 10*3/uL — ABNORMAL HIGH (ref 0.1–1.0)
Monocytes Relative: 12 %
Neutro Abs: 5.8 10*3/uL (ref 1.7–7.7)
Neutrophils Relative %: 63 %
Platelets: 338 10*3/uL (ref 150–400)
RBC: 4.09 MIL/uL (ref 3.87–5.11)
RDW: 13.3 % (ref 11.5–15.5)
WBC: 9.2 10*3/uL (ref 4.0–10.5)
nRBC: 0 % (ref 0.0–0.2)

## 2018-10-06 LAB — COMPREHENSIVE METABOLIC PANEL
ALT: 14 U/L (ref 0–44)
AST: 24 U/L (ref 15–41)
Albumin: 3.7 g/dL (ref 3.5–5.0)
Alkaline Phosphatase: 85 U/L (ref 38–126)
Anion gap: 10 (ref 5–15)
BUN: 11 mg/dL (ref 8–23)
CO2: 25 mmol/L (ref 22–32)
Calcium: 9.8 mg/dL (ref 8.9–10.3)
Chloride: 107 mmol/L (ref 98–111)
Creatinine, Ser: 0.89 mg/dL (ref 0.44–1.00)
GFR calc Af Amer: >60 mL/min (ref >60)
GFR calc non Af Amer: >60 mL/min (ref >60)
Glucose, Bld: 107 mg/dL — ABNORMAL HIGH (ref 70–99)
Potassium: 4.4 mmol/L (ref 3.5–5.1)
Sodium: 142 mmol/L (ref 135–145)
Total Bilirubin: 0.5 mg/dL (ref 0.3–1.2)
Total Protein: 7.2 g/dL (ref 6.5–8.1)

## 2018-10-06 MED ORDER — DEXAMETHASONE SODIUM PHOSPHATE 10 MG/ML IJ SOLN
INTRAMUSCULAR | Status: AC
  Filled 2018-10-06: qty 1

## 2018-10-06 MED ORDER — ONDANSETRON HCL 8 MG PO TABS
8.0000 mg | ORAL_TABLET | Freq: Once | ORAL | Status: AC
  Administered 2018-10-06: 11:00:00 8 mg via ORAL

## 2018-10-06 MED ORDER — DEXAMETHASONE SODIUM PHOSPHATE 10 MG/ML IJ SOLN
10.0000 mg | Freq: Once | INTRAMUSCULAR | Status: AC
  Administered 2018-10-06: 11:00:00 10 mg via INTRAVENOUS

## 2018-10-06 MED ORDER — SODIUM CHLORIDE 0.9% FLUSH
10.0000 mL | INTRAVENOUS | Status: DC | PRN
  Administered 2018-10-06: 10:00:00 10 mL via INTRAVENOUS
  Filled 2018-10-06: qty 10

## 2018-10-06 MED ORDER — SODIUM CHLORIDE 0.9 % IV SOLN
500.0000 mg/m2 | Freq: Once | INTRAVENOUS | Status: AC
  Administered 2018-10-06: 12:00:00 800 mg via INTRAVENOUS
  Filled 2018-10-06: qty 20

## 2018-10-06 MED ORDER — SODIUM CHLORIDE 0.9 % IV SOLN
Freq: Once | INTRAVENOUS | Status: AC
  Administered 2018-10-06: 11:00:00 via INTRAVENOUS
  Filled 2018-10-06: qty 250

## 2018-10-06 MED ORDER — HEPARIN SOD (PORK) LOCK FLUSH 100 UNIT/ML IV SOLN
500.0000 [IU] | Freq: Once | INTRAVENOUS | Status: AC | PRN
  Administered 2018-10-06: 12:00:00 500 [IU]
  Filled 2018-10-06: qty 5

## 2018-10-06 MED ORDER — ONDANSETRON HCL 8 MG PO TABS
ORAL_TABLET | ORAL | Status: AC
  Filled 2018-10-06: qty 1

## 2018-10-06 MED ORDER — SODIUM CHLORIDE 0.9% FLUSH
10.0000 mL | INTRAVENOUS | Status: DC | PRN
  Administered 2018-10-06: 12:00:00 10 mL
  Filled 2018-10-06: qty 10

## 2018-10-06 NOTE — Patient Instructions (Signed)
[facility] Discharge Instructions for Patients Receiving Chemotherapy  Today you received the following chemotherapy agents: Alimta  To help prevent nausea and vomiting after your treatment, we encourage you to take your nausea medication as directed.   If you develop nausea and vomiting that is not controlled by your nausea medication, call the clinic.   BELOW ARE SYMPTOMS THAT SHOULD BE REPORTED IMMEDIATELY:  *FEVER GREATER THAN 100.5 F  *CHILLS WITH OR WITHOUT FEVER  NAUSEA AND VOMITING THAT IS NOT CONTROLLED WITH YOUR NAUSEA MEDICATION  *UNUSUAL SHORTNESS OF BREATH  *UNUSUAL BRUISING OR BLEEDING  TENDERNESS IN MOUTH AND THROAT WITH OR WITHOUT PRESENCE OF ULCERS  *URINARY PROBLEMS  *BOWEL PROBLEMS  UNUSUAL RASH Items with * indicate a potential emergency and should be followed up as soon as possible.  Feel free to call the clinic should you have any questions or concerns. The clinic phone number is [phone].  Please show the [facility] at check-in to the Emergency Department and triage nurse.

## 2018-10-06 NOTE — Progress Notes (Signed)
[facility] OFFICE PROGRESS NOTE  Danielle Sanders, MD [place] [address]  DIAGNOSIS: Metastatic non-small cell lung cancer initially diagnosed as locally advanced stage IIIB with a right Pancoast tumor involving the vertebral body as well as prominent canal invasion with spinal cord compression in October 2007. The patient also has metastatic disease to the brain in April 2008.  PRIOR THERAPY:  1. Status post concurrent chemoradiation with weekly carboplatin and paclitaxel, last dose was given November 18, 2005. 2. 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 3. Status post gamma knife radiotherapy to a solitary brain lesion located in the superior frontal area of the brain at [facility] in April of 2008. 4. Status post palliative radiotherapy to the lateral abdominal wall metastatic lesion under the care of Dr. Lisbeth Renshaw, completed March of 2009. 5. Status post 6 cycles of systemic chemotherapy with carboplatin and Alimta. Last dose was given July 26, 2007 with disease stabilization. 6. Gamma knife stereotactic radiotherapy to 2 brain lesions one involving the right frontal dural based as well as right parietal lesion performed on 05/07/2012 under the care of Dr. Vallarie Mare at [facility].  CURRENT THERAPY: Maintenance systemic chemotherapy with Alimta 500 MG/M2 every 3 weeks, status post 192 cycles.  INTERVAL HISTORY: Danielle [name] 68 y.o. female returns to the clinic for a follow up visit. The patient is feeling well today without any concerning complaints. The patient continues to tolerate single agent Alimta well without any adverse side effects. Denies any fever, chills, night sweats, or weight loss. Denies any chest pain, shortness of breath, cough, or hemoptysis. Denies any nausea, vomiting, diarrhea, or constipation. Denies any headache or visual changes. Denies any  rashes or skin changes. The patient recently had a restaging CT scan performed. The patient is here today for evaluation and to review her scan results prior to starting cycle # 193  MEDICAL HISTORY: Past Medical History:  Diagnosis Date  . Anemia   . 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 CVA (cerebral vascular accident) ([place]) 04/2014   pt states had 2 cva's within 2 wks  . DJD (degenerative joint disease), cervical   . Fibromyalgia   . H/O: pneumonia   . History of tobacco abuse quit 10/08   on nicotine patch  . Hypertension   . Hypokalemia   . Lung cancer ([place]) dx'd 09/2005   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 had chemo and radiation for lung ca  . Thrush 12/11/2010    ALLERGIES:  is allergic to contrast media [iodinated diagnostic agents]; iohexol; sulfa drugs cross reactors; and co-trimoxazole injection [sulfamethoxazole-trimethoprim].  MEDICATIONS:  Current Outpatient Medications  Medication Sig Dispense Refill  . amLODipine (NORVASC) 10 MG tablet TAKE 1 TABLET (10 MG TOTAL) BY MOUTH DAILY. 90 tablet 3  . aspirin 325 MG EC tablet TAKE 1 TABLET BY MOUTH EVERY DAY 90 tablet 0  . atorvastatin (LIPITOR) 40 MG tablet TAKE 1 TABLET BY MOUTH EVERY DAY AT 6 PM 90 tablet 3  . diphenhydrAMINE (BENADRYL) 25 MG tablet Take 1 tablet (25 mg total) by mouth as directed. 2 hours before CT scan. 5 tablet 0  . FeFum-FePoly-FA-B Cmp-C-Biot (INTEGRA PLUS) CAPS Take 1 capsule by mouth every morning. 90 capsule 3  . fluticasone (FLONASE) 50 MCG/ACT nasal spray Place 1-2 sprays into both nostrils daily as needed. 16 g 5  . folic acid (FOLVITE) 1 MG tablet TAKE  1 TABLET BY MOUTH EVERY DAY 90 tablet 1  . furosemide (LASIX) 20 MG tablet TAKE 1 TABLET BY MOUTH EVERY DAY AS NEEDED FOR SWELLING 20 tablet 1  . lacosamide (VIMPAT) 50 MG TABS tablet Take by mouth.    . lidocaine-prilocaine (EMLA) cream APPLY TO PORT-A-CATH AS DIRECTED 1-2 HOURS PRIOR TO CHEMOTHERAPY 30 g 1  .  lisinopril (PRINIVIL,ZESTRIL) 20 MG tablet TAKE 2 TABLETS (40 MG TOTAL) BY MOUTH DAILY. 180 tablet 3  . morphine (MS CONTIN) 60 MG 12 hr tablet Take 1 tablet (60 mg total) by mouth 2 (two) times daily. 60 tablet 0  . morphine (MSIR) 30 MG tablet Take 1 tablet (30 mg total) by mouth every 4 (four) hours as needed (breakthrough pain). 60 tablet 0  . Olopatadine HCl 0.2 % SOLN INSTILL 1 DROP INTO AFFECTED EYE(S) EVERY DAY 2.5 mL 1  . ondansetron (ZOFRAN) 8 MG tablet TAKE 1 TABLET BY MOUTH EVERY 8 HOURS AS NEEDED FOR NAUSEA AND VOMITING 30 tablet 0  . predniSONE (DELTASONE) 50 MG tablet 1 tablet by mouth 13 hours ,7 hours and 1 hour  before CT scan 3 tablet 4  . triamcinolone (KENALOG) 0.025 % ointment Apply 1 application topically 2 (two) times daily. 30 g 0   No current facility-administered medications for this visit.    Facility-Administered Medications Ordered in Other Visits  Medication Dose Route Frequency Provider Last Rate Last Dose  . sodium chloride flush (NS) 0.9 % injection 10 mL  10 mL Intracatheter PRN Curt Bears, MD   10 mL at 10/06/18 1203    SURGICAL HISTORY:  Past Surgical History:  Procedure Laterality Date  . CERVICAL LAMINECTOMY  1995  . KNEE SURGERY  1990   Left x 2  . 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 LOOP RECORDER IMPLANT N/A 04/19/2014   Procedure: LOOP RECORDER IMPLANT;  Surgeon: Thompson Grayer, MD;  Location: [facility] CATH LAB;  Service: Cardiovascular;  Laterality: N/A;  . OTHER SURGICAL HISTORY  2008   Gamma knife surgery to remove brain met   . PORTACATH PLACEMENT  -[name]   TIP IN CAVOATRIAL JUNCTION  . TEE WITHOUT CARDIOVERSION N/A 04/19/2014   Procedure: TRANSESOPHAGEAL ECHOCARDIOGRAM (TEE);  Surgeon: Larey Dresser, MD;  Location: [facility];  Service: Cardiovascular;  Laterality: N/A;    REVIEW OF SYSTEMS:   Review of Systems  Constitutional: Negative for appetite change, chills, fatigue, fever and unexpected weight  change.  HENT:   Negative for mouth sores, nosebleeds, sore throat and trouble swallowing.   Eyes: Negative for eye problems and icterus.  Respiratory: Positive for baseline shortness of breath with exertion. Negative for cough, hemoptysis, and wheezing.   Cardiovascular: Negative for chest pain and leg swelling.  Gastrointestinal: Negative for abdominal pain, constipation, diarrhea, nausea and vomiting.  Genitourinary: Negative for bladder incontinence, difficulty urinating, dysuria, frequency and hematuria.   Musculoskeletal: Negative for back pain, gait problem, neck pain and neck stiffness.  Skin: Negative for itching and rash.  Neurological: Negative for dizziness, extremity weakness, gait problem, headaches, light-headedness and seizures.  Hematological: Negative for adenopathy. Does not bruise/bleed easily.  Psychiatric/Behavioral: Negative for confusion, depression and sleep disturbance. The patient is not nervous/anxious.     PHYSICAL EXAMINATION:  Blood pressure 139/60, pulse 90, temperature 98.9 F (37.2 C), temperature source Oral, resp. rate 18, height 5' 2.5" (1.588 m), weight 150 lb 6.4 oz (68.2 kg), SpO2 96 %.  ECOG PERFORMANCE STATUS:  1 - Symptomatic but completely ambulatory  Physical Exam  Constitutional: Oriented to person, place, and time and well-developed, well-nourished, and in no distress.  HENT:  Head: Normocephalic and atraumatic.  Mouth/Throat: Oropharynx is clear and moist. No oropharyngeal exudate.  Eyes: Conjunctivae are normal. Right eye exhibits no discharge. Left eye exhibits no discharge. No scleral icterus.  Neck: Normal range of motion. Neck supple.  Cardiovascular: Normal rate, regular rhythm, normal heart sounds and intact distal pulses.   Pulmonary/Chest: Effort normal and breath sounds normal. No respiratory distress. No wheezes. No rales.  Abdominal: Soft. Bowel sounds are normal. Exhibits no distension and no mass. There is no tenderness.   Musculoskeletal: Normal range of motion. Exhibits no edema.  Lymphadenopathy:    No cervical adenopathy.  Neurological: Mild drooping of right eye from prior hx of brain tumor. Alert and oriented to person, place, and time. Exhibits normal muscle tone. Gait normal. Coordination normal.  Skin: Multiple scars on her face from having skin lesions removed, the largest on the right temple. Skin is warm and dry. No rash noted. Not diaphoretic. No erythema. No pallor.  Psychiatric: Mood, memory and judgment normal.  Vitals reviewed.  LABORATORY DATA: Lab Results  Component Value Date   WBC 9.2 10/06/2018   HGB 13.0 10/06/2018   HCT 40.4 10/06/2018   MCV 98.8 10/06/2018   PLT 338 10/06/2018      Chemistry      Component Value Date/Time   NA 142 10/06/2018 0923   NA 142 12/24/2016 0816   K 4.4 10/06/2018 0923   K 4.4 12/24/2016 0816   CL 107 10/06/2018 0923   CL 106 06/30/2012 1004   CO2 25 10/06/2018 0923   CO2 28 12/24/2016 0816   BUN 11 10/06/2018 0923   BUN 15.8 12/24/2016 0816   CREATININE 0.89 10/06/2018 0923   CREATININE 0.87 07/22/2017 0826   CREATININE 1.1 12/24/2016 0816      Component Value Date/Time   CALCIUM 9.8 10/06/2018 0923   CALCIUM 9.6 12/24/2016 0816   ALKPHOS 85 10/06/2018 0923   ALKPHOS 80 12/24/2016 0816   AST 24 10/06/2018 0923   AST 20 07/22/2017 0826   AST 20 12/24/2016 0816   ALT 14 10/06/2018 0923   ALT 11 07/22/2017 0826   ALT 11 12/24/2016 0816   BILITOT 0.5 10/06/2018 0923   BILITOT 0.4 07/22/2017 0826   BILITOT 0.34 12/24/2016 0816       RADIOGRAPHIC STUDIES:  Ct Chest W Contrast  Result Date: 10/02/2018 CLINICAL DATA:  Multiple exams, including 03/09/2018 EXAM: CT CHEST, ABDOMEN, AND PELVIS WITH CONTRAST TECHNIQUE: Multidetector CT imaging of the chest, abdomen and pelvis was performed following the standard protocol during bolus administration of intravenous contrast. CONTRAST:  171m OMNIPAQUE IOHEXOL 300 MG/ML  SOLN COMPARISON:   03/29/2018 FINDINGS: CT CHEST FINDINGS Cardiovascular: Right Port-A-Cath tip: Right atrium. Coronary, aortic arch, and branch vessel atherosclerotic vascular disease. Mediastinum/Nodes: No pathologic adenopathy identified. Lungs/Pleura: Chronic pleural thickening with some faint calcification posteriorly at the right lung apex, currently measuring 1.2 cm in thickness on image 16/4. This is been present chronically for over 10 years and had only low-grade activity for example on prior PET-CT of 01/19/2007. Focal pleural thickening anteriorly in the right upper lobe for example on image 41/4 measuring about 1.0 by 0.7 cm. Radiation therapy related findings are present in the adjacent lung. Given the long-chronicity of the pleural thickening in the right upper lobe this may well represent residua of prior therapy rather  than active tumor. There some mild scattered scarring in both lungs. Bilateral airway thickening is present, with some segmental and subsegmental airway plugging in the right lower lobe. Severe emphysema.  Mild left apical pleuroparenchymal scarring. Musculoskeletal: Sclerosis of the right first and second ribs likely from prior radiation therapy. Mild geographic sclerosis of both humeral heads compatible with chronic avascular necrosis. Vertebra plana at T3. Vertebral augmentations at T4 and T7. These bony findings are chronic. CT ABDOMEN PELVIS FINDINGS Hepatobiliary: Unremarkable Pancreas: Unremarkable Spleen: Unremarkable Adrenals/Urinary Tract: Stable 5 mm hypodense lesion in the right kidney upper pole, technically too small to characterize although statistically likely to be benign. Otherwise unremarkable. Stomach/Bowel: Mild wall thickening in the descending colon, sigmoid colon, and rectum although much of this appearance may be from underdistention. Low-grade colitis is not totally excluded but there is no stranding in the adjacent mesentery. Vascular/Lymphatic: Aortoiliac atherosclerotic  vascular disease. No pathologic adenopathy. Reproductive: Unremarkable Other: No supplemental non-categorized findings. Musculoskeletal: Minimal findings of chronic avascular necrosis in both femoral heads. Mild left foraminal stenosis at L5-S1 due to spurring. Chronically stable faint sclerosis eccentric to the right of the L1 vertebral level. IMPRESSION: 1. Currently there are no compelling findings of active or progressive malignancy. Long-term chronic stability of the pleural thickening at the right lung apex. Adjacent radiation therapy related findings noted. Stable bony findings including faint sclerosis eccentric to the right at L1; T3 vertebral plana; and vertebral augmentations at T4 and T7. 2. Mild wall thickening in the descending colon, sigmoid colon, and rectum although much of this may be from nondistention. Low-grade distal colitis not totally excluded. 3. Other imaging findings of potential clinical significance: Aortic Atherosclerosis (ICD10-I70.0). Coronary atherosclerosis. Emphysema (ICD10-J43.9). Scattered mild scarring in the lungs. 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 Mild left foraminal stenosis at L5-S1. Electronically Signed   By: Van Clines M.D.   On: 10/02/2018 09:05   Ct Abdomen Pelvis W Contrast  Result Date: 10/02/2018 CLINICAL DATA:  Multiple exams, including 03/09/2018 EXAM: CT CHEST, ABDOMEN, AND PELVIS WITH CONTRAST TECHNIQUE: Multidetector CT imaging of the chest, abdomen and pelvis was performed following the standard protocol during bolus administration of intravenous contrast. CONTRAST:  164m OMNIPAQUE IOHEXOL 300 MG/ML  SOLN COMPARISON:  03/29/2018 FINDINGS: CT CHEST FINDINGS Cardiovascular: Right Port-A-Cath tip: Right atrium. Coronary, aortic arch, and branch vessel atherosclerotic vascular disease. Mediastinum/Nodes: No pathologic adenopathy identified. Lungs/Pleura: Chronic pleural thickening with some faint calcification  posteriorly at the right lung apex, currently measuring 1.2 cm in thickness on image 16/4. This is been present chronically for over 10 years and had only low-grade activity for example on prior PET-CT of 01/19/2007. Focal pleural thickening anteriorly in the right upper lobe for example on image 41/4 measuring about 1.0 by 0.7 cm. Radiation therapy related findings are present in the adjacent lung. Given the long-chronicity of the pleural thickening in the right upper lobe this may well represent residua of prior therapy rather than active tumor. There some mild scattered scarring in both lungs. Bilateral airway thickening is present, with some segmental and subsegmental airway plugging in the right lower lobe. Severe emphysema.  Mild left apical pleuroparenchymal scarring. Musculoskeletal: Sclerosis of the right first and second ribs likely from prior radiation therapy. Mild geographic sclerosis of both humeral heads compatible with chronic avascular necrosis. Vertebra plana at T3. Vertebral augmentations at T4 and T7. These bony findings are chronic. CT ABDOMEN PELVIS FINDINGS Hepatobiliary: Unremarkable Pancreas: Unremarkable Spleen: Unremarkable Adrenals/Urinary Tract: Stable  5 mm hypodense lesion in the right kidney upper pole, technically too small to characterize although statistically likely to be benign. Otherwise unremarkable. Stomach/Bowel: Mild wall thickening in the descending colon, sigmoid colon, and rectum although much of this appearance may be from underdistention. Low-grade colitis is not totally excluded but there is no stranding in the adjacent mesentery. Vascular/Lymphatic: Aortoiliac atherosclerotic vascular disease. No pathologic adenopathy. Reproductive: Unremarkable Other: No supplemental non-categorized findings. Musculoskeletal: Minimal findings of chronic avascular necrosis in both femoral heads. Mild left foraminal stenosis at L5-S1 due to spurring. Chronically stable faint sclerosis  eccentric to the right of the L1 vertebral level. IMPRESSION: 1. Currently there are no compelling findings of active or progressive malignancy. Long-term chronic stability of the pleural thickening at the right lung apex. Adjacent radiation therapy related findings noted. Stable bony findings including faint sclerosis eccentric to the right at L1; T3 vertebral plana; and vertebral augmentations at T4 and T7. 2. Mild wall thickening in the descending colon, sigmoid colon, and rectum although much of this may be from nondistention. Low-grade distal colitis not totally excluded. 3. Other imaging findings of potential clinical significance: Aortic Atherosclerosis (ICD10-I70.0). Coronary atherosclerosis. Emphysema (ICD10-J43.9). Scattered mild scarring in the lungs. 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 Mild left foraminal stenosis at L5-S1. Electronically Signed   By: Van Clines M.D.   On: 10/02/2018 09:05     ASSESSMENT/PLAN:  This is a very pleasant 68 year old Caucasian female with metastatic non-small cell lung cancer who was initially diagnosed in October 2007.  She is status post current chemoradiation followed by consolidation chemotherapy.  She is currently undergoing weekly chemotherapy with single agent Alimta 500 mg/m.  She is status post 192 cycles.  The patient recently had a restaging CT scan performed.  Dr. Julien Nordmann personally and independently reviewed the scan and discussed the results with the patient today.  The scan did not show any concerning evidence for disease progression.  Dr. Julien Nordmann recommends that the patient continue with her current treatment.  She will receive cycle #193 today as scheduled.  We will see the patient back for a follow-up visit in 3 weeks for evaluation before starting cycle #194.  The patient was advised to call immediately if she has any concerning symptoms in the interval. The patient voices understanding of  current disease status and treatment options and is in agreement with the current care plan. All questions were answered. The patient knows to call the clinic with any problems, questions or concerns. We can certainly see the patient much sooner if necessary  No orders of the defined types were placed in this encounter.    Danielle Ozdemir L Sokha Craker, PA-C 10/06/18]  ADDENDUM: Hematology/Oncology Attending: I had a face-to-face encounter with the patient today.  I recommended her care plan.  This is a very pleasant 68 years old white female with metastatic non-small cell lung cancer.  She is currently undergoing maintenance treatment with Alimta status post 192 cycles. The patient had repeat CT scan of the chest, abdomen pelvis performed recently. I personally and independently reviewed the scans and discussed the results with the patient today. Her scan showed no concerning findings for disease progression. I recommended for the patient to continue her current treatment with Alimta and she will proceed with cycle #193 today. The patient will come back for follow-up visit in 3 weeks for evaluation before the next cycle of her treatment. The patient was advised to call immediately if she has any  concerning symptoms in the interval. Disclaimer: This note was dictated with voice recognition software. Similar sounding words can inadvertently be transcribed and may be missed upon review. Eilleen Kempf, MD 10/06/18

## 2018-10-13 ENCOUNTER — Telehealth: Payer: Self-pay | Admitting: *Deleted

## 2018-10-13 ENCOUNTER — Other Ambulatory Visit: Payer: Self-pay | Admitting: Internal Medicine

## 2018-10-13 DIAGNOSIS — C3410 Malignant neoplasm of upper lobe, unspecified bronchus or lung: Secondary | ICD-10-CM

## 2018-10-13 DIAGNOSIS — C3490 Malignant neoplasm of unspecified part of unspecified bronchus or lung: Secondary | ICD-10-CM

## 2018-10-13 MED ORDER — MORPHINE SULFATE ER 60 MG PO TBCR: 60.0000 mg | EXTENDED_RELEASE_TABLET | Freq: Two times a day (BID) | ORAL | 0 refills | Status: DC

## 2018-10-13 NOTE — Telephone Encounter (Signed)
Received call from patient requesting refill on her MS Contin 60 mg. Last filled 09/15/18 for # 55

## 2018-10-13 NOTE — Telephone Encounter (Signed)
Received vm message from pharmacist at [facility] in [place]. She states that they only had 58 tablets of MSContin 60 mg. Pt agreed to pick those up. She will likely need refill 1 day early

## 2018-10-18 ENCOUNTER — Other Ambulatory Visit: Payer: Self-pay | Admitting: Internal Medicine

## 2018-10-27 ENCOUNTER — Encounter: Payer: Self-pay | Admitting: Internal Medicine

## 2018-10-27 ENCOUNTER — Other Ambulatory Visit: Payer: Self-pay

## 2018-10-27 ENCOUNTER — Inpatient Hospital Stay: Payer: Medicare Other

## 2018-10-27 ENCOUNTER — Inpatient Hospital Stay: Payer: Medicare Other | Attending: Internal Medicine

## 2018-10-27 ENCOUNTER — Inpatient Hospital Stay (HOSPITAL_BASED_OUTPATIENT_CLINIC_OR_DEPARTMENT_OTHER): Payer: Medicare Other | Admitting: Internal Medicine

## 2018-10-27 VITALS — BP 151/73 | HR 97 | Temp 99.1°F | Resp 18 | Ht 62.0 in | Wt 154.0 lb

## 2018-10-27 DIAGNOSIS — Z5111 Encounter for antineoplastic chemotherapy: Secondary | ICD-10-CM | POA: Insufficient documentation

## 2018-10-27 DIAGNOSIS — C7931 Secondary malignant neoplasm of brain: Secondary | ICD-10-CM | POA: Insufficient documentation

## 2018-10-27 DIAGNOSIS — M479 Spondylosis, unspecified: Secondary | ICD-10-CM | POA: Diagnosis not present

## 2018-10-27 DIAGNOSIS — R110 Nausea: Secondary | ICD-10-CM | POA: Diagnosis not present

## 2018-10-27 DIAGNOSIS — I10 Essential (primary) hypertension: Secondary | ICD-10-CM | POA: Insufficient documentation

## 2018-10-27 DIAGNOSIS — C7951 Secondary malignant neoplasm of bone: Secondary | ICD-10-CM | POA: Insufficient documentation

## 2018-10-27 DIAGNOSIS — C3411 Malignant neoplasm of upper lobe, right bronchus or lung: Secondary | ICD-10-CM

## 2018-10-27 DIAGNOSIS — M797 Fibromyalgia: Secondary | ICD-10-CM | POA: Diagnosis not present

## 2018-10-27 DIAGNOSIS — Z8673 Personal history of transient ischemic attack (TIA), and cerebral infarction without residual deficits: Secondary | ICD-10-CM | POA: Insufficient documentation

## 2018-10-27 DIAGNOSIS — Z923 Personal history of irradiation: Secondary | ICD-10-CM | POA: Diagnosis not present

## 2018-10-27 DIAGNOSIS — Z7982 Long term (current) use of aspirin: Secondary | ICD-10-CM | POA: Diagnosis not present

## 2018-10-27 DIAGNOSIS — Z79899 Other long term (current) drug therapy: Secondary | ICD-10-CM | POA: Diagnosis not present

## 2018-10-27 DIAGNOSIS — E876 Hypokalemia: Secondary | ICD-10-CM | POA: Insufficient documentation

## 2018-10-27 DIAGNOSIS — Z95828 Presence of other vascular implants and grafts: Secondary | ICD-10-CM

## 2018-10-27 DIAGNOSIS — C3490 Malignant neoplasm of unspecified part of unspecified bronchus or lung: Secondary | ICD-10-CM

## 2018-10-27 LAB — COMPREHENSIVE METABOLIC PANEL
ALT: 13 U/L (ref 0–44)
AST: 22 U/L (ref 15–41)
Albumin: 3.5 g/dL (ref 3.5–5.0)
Alkaline Phosphatase: 86 U/L (ref 38–126)
Anion gap: 12 (ref 5–15)
BUN: 9 mg/dL (ref 8–23)
CO2: 25 mmol/L (ref 22–32)
Calcium: 9.4 mg/dL (ref 8.9–10.3)
Chloride: 106 mmol/L (ref 98–111)
Creatinine, Ser: 0.75 mg/dL (ref 0.44–1.00)
GFR calc Af Amer: >60 mL/min (ref >60)
GFR calc non Af Amer: >60 mL/min (ref >60)
Glucose, Bld: 106 mg/dL — ABNORMAL HIGH (ref 70–99)
Potassium: 4.2 mmol/L (ref 3.5–5.1)
Sodium: 143 mmol/L (ref 135–145)
Total Bilirubin: 0.4 mg/dL (ref 0.3–1.2)
Total Protein: 7.2 g/dL (ref 6.5–8.1)

## 2018-10-27 LAB — CBC WITH DIFFERENTIAL/PLATELET
Abs Immature Granulocytes: 0.03 10*3/uL (ref 0.00–0.07)
Basophils Absolute: 0.1 10*3/uL (ref 0.0–0.1)
Basophils Relative: 1 %
Eosinophils Absolute: 0.1 10*3/uL (ref 0.0–0.5)
Eosinophils Relative: 1 %
HCT: 39.1 % (ref 36.0–46.0)
Hemoglobin: 12.9 g/dL (ref 12.0–15.0)
Immature Granulocytes: 0 %
Lymphocytes Relative: 23 %
Lymphs Abs: 2.1 10*3/uL (ref 0.7–4.0)
MCH: 31.9 pg (ref 26.0–34.0)
MCHC: 33 g/dL (ref 30.0–36.0)
MCV: 96.8 fL (ref 80.0–100.0)
Monocytes Absolute: 1 10*3/uL (ref 0.1–1.0)
Monocytes Relative: 11 %
Neutro Abs: 6.1 10*3/uL (ref 1.7–7.7)
Neutrophils Relative %: 64 %
Platelets: 332 10*3/uL (ref 150–400)
RBC: 4.04 MIL/uL (ref 3.87–5.11)
RDW: 13.1 % (ref 11.5–15.5)
WBC: 9.4 10*3/uL (ref 4.0–10.5)
nRBC: 0 % (ref 0.0–0.2)

## 2018-10-27 MED ORDER — SODIUM CHLORIDE 0.9 % IV SOLN
Freq: Once | INTRAVENOUS | Status: AC
  Administered 2018-10-27: 10:00:00 via INTRAVENOUS
  Filled 2018-10-27: qty 250

## 2018-10-27 MED ORDER — DEXAMETHASONE SODIUM PHOSPHATE 10 MG/ML IJ SOLN
INTRAMUSCULAR | Status: AC
  Filled 2018-10-27: qty 1

## 2018-10-27 MED ORDER — ONDANSETRON HCL 8 MG PO TABS: ORAL_TABLET | ORAL | 0 refills | Status: DC

## 2018-10-27 MED ORDER — HEPARIN SOD (PORK) LOCK FLUSH 100 UNIT/ML IV SOLN
500.0000 [IU] | Freq: Once | INTRAVENOUS | Status: AC | PRN
  Administered 2018-10-27: 500 [IU]
  Filled 2018-10-27: qty 5

## 2018-10-27 MED ORDER — DEXAMETHASONE SODIUM PHOSPHATE 10 MG/ML IJ SOLN
10.0000 mg | Freq: Once | INTRAMUSCULAR | Status: AC
  Administered 2018-10-27: 10 mg via INTRAVENOUS

## 2018-10-27 MED ORDER — ONDANSETRON HCL 8 MG PO TABS
8.0000 mg | ORAL_TABLET | Freq: Once | ORAL | Status: AC
  Administered 2018-10-27: 8 mg via ORAL

## 2018-10-27 MED ORDER — SODIUM CHLORIDE 0.9% FLUSH
10.0000 mL | INTRAVENOUS | Status: DC | PRN
  Administered 2018-10-27: 10 mL via INTRAVENOUS
  Filled 2018-10-27: qty 10

## 2018-10-27 MED ORDER — SODIUM CHLORIDE 0.9% FLUSH
10.0000 mL | INTRAVENOUS | Status: DC | PRN
  Administered 2018-10-27: 10 mL
  Filled 2018-10-27: qty 10

## 2018-10-27 MED ORDER — ONDANSETRON HCL 8 MG PO TABS
ORAL_TABLET | ORAL | Status: AC
  Filled 2018-10-27: qty 1

## 2018-10-27 MED ORDER — SODIUM CHLORIDE 0.9 % IV SOLN
500.0000 mg/m2 | Freq: Once | INTRAVENOUS | Status: AC
  Administered 2018-10-27: 800 mg via INTRAVENOUS
  Filled 2018-10-27: qty 20

## 2018-10-27 NOTE — Patient Instructions (Signed)
[facility] Discharge Instructions for Patients Receiving Chemotherapy  Today you received the following chemotherapy agents Alimta  To help prevent nausea and vomiting after your treatment, we encourage you to take your nausea medication as directed.  If you develop nausea and vomiting that is not controlled by your nausea medication, call the clinic.   BELOW ARE SYMPTOMS THAT SHOULD BE REPORTED IMMEDIATELY:  *FEVER GREATER THAN 100.5 F  *CHILLS WITH OR WITHOUT FEVER  NAUSEA AND VOMITING THAT IS NOT CONTROLLED WITH YOUR NAUSEA MEDICATION  *UNUSUAL SHORTNESS OF BREATH  *UNUSUAL BRUISING OR BLEEDING  TENDERNESS IN MOUTH AND THROAT WITH OR WITHOUT PRESENCE OF ULCERS  *URINARY PROBLEMS  *BOWEL PROBLEMS  UNUSUAL RASH Items with * indicate a potential emergency and should be followed up as soon as possible.  Feel free to call the clinic should you have any questions or concerns. The clinic phone number is [phone].  Please show the [facility] at check-in to the Emergency Department and triage nurse.

## 2018-10-27 NOTE — Progress Notes (Signed)
[place] [phone]   Fax:([phone]  OFFICE PROGRESS NOTE  [name], MD [place] [address]  DIAGNOSIS: Metastatic non-small cell lung cancer initially diagnosed as locally advanced stage IIIB with a right Pancoast tumor involving the vertebral body as well as prominent canal invasion with spinal cord compression in October 2007. The patient also has metastatic disease to the brain in April 2008.  PRIOR THERAPY: 1. Status post concurrent chemoradiation with weekly carboplatin and paclitaxel, last dose was given November 18, 2005. 2. 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 3. Status post gamma knife radiotherapy to a solitary brain lesion located in the superior frontal area of the brain at [facility] in April of 2008. 4. Status post palliative radiotherapy to the lateral abdominal wall metastatic lesion under the care of Dr. Lisbeth Renshaw, completed March of 2009. 5. Status post 6 cycles of systemic chemotherapy with carboplatin and Alimta. Last dose was given July 26, 2007 with disease stabilization. 6. Gamma knife stereotactic radiotherapy to 2 brain lesions one involving the right frontal dural based as well as right parietal lesion performed on 05/07/2012 under the care of Dr. Vallarie Mare at [facility].  CURRENT THERAPY: Maintenance systemic chemotherapy with Alimta 500 MG/M2 every 3 weeks, status post 193 cycles.  INTERVAL HISTORY: Danielle [name] 68 y.o. female returns to the clinic today for follow-up visit.  The patient is feeling fine today with no concerning complaints except for mild fatigue.  She denied having any current chest pain, shortness of breath, cough or hemoptysis.  She has no fever or chills.  She denied having any nausea, vomiting, diarrhea or constipation.  She has no recent weight loss or night sweats.  She is here  today for evaluation before starting cycle #194.  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 had chemo and radiation for lung ca   Thrush 12/11/2010    ALLERGIES:  is allergic to contrast media [iodinated diagnostic agents]; iohexol; sulfa drugs cross reactors; and co-trimoxazole injection [sulfamethoxazole-trimethoprim].  MEDICATIONS:  Current Outpatient Medications  Medication Sig Dispense Refill   amLODipine (NORVASC) 10 MG tablet TAKE 1 TABLET (10 MG TOTAL) BY MOUTH DAILY. 90 tablet 3   aspirin 325 MG EC tablet TAKE 1 TABLET BY MOUTH EVERY DAY 90 tablet 0   atorvastatin (LIPITOR) 40 MG tablet TAKE 1 TABLET BY MOUTH EVERY DAY AT 6 PM 90 tablet 3   diphenhydrAMINE (BENADRYL) 25 MG tablet Take 1 tablet (25 mg total) by mouth as directed. 2 hours before CT scan. 5 tablet 0   FeFum-FePoly-FA-B Cmp-C-Biot (INTEGRA PLUS) CAPS Take 1 capsule by mouth every morning. 90 capsule 3   fluticasone (FLONASE) 50 MCG/ACT nasal spray Place 1-2 sprays into both nostrils daily as needed. 16 g 5   folic acid (FOLVITE) 1 MG tablet TAKE 1 TABLET BY MOUTH EVERY DAY 90 tablet 1   furosemide (LASIX) 20  MG tablet TAKE 1 TABLET BY MOUTH EVERY DAY AS NEEDED FOR SWELLING 20 tablet 1   lacosamide (VIMPAT) 50 MG TABS tablet Take by mouth.     lidocaine-prilocaine (EMLA) cream APPLY TO PORT-A-CATH AS DIRECTED 1-2 HOURS PRIOR TO CHEMOTHERAPY 30 g 1   lisinopril (PRINIVIL,ZESTRIL) 20 MG tablet TAKE 2 TABLETS (40 MG TOTAL) BY MOUTH DAILY. 180 tablet 3   morphine (MS CONTIN) 60 MG 12 hr  tablet Take 1 tablet (60 mg total) by mouth 2 (two) times daily. 60 tablet 0   morphine (MSIR) 30 MG tablet Take 1 tablet (30 mg total) by mouth every 4 (four) hours as needed (breakthrough pain). 60 tablet 0   Olopatadine HCl 0.2 % SOLN INSTILL 1 DROP INTO AFFECTED EYE(S) EVERY DAY 2.5 mL 1   ondansetron (ZOFRAN) 8 MG tablet TAKE 1 TABLET BY MOUTH EVERY 8 HOURS AS NEEDED FOR NAUSEA AND VOMITING 30 tablet 0   predniSONE (DELTASONE) 50 MG tablet 1 tablet by mouth 13 hours ,7 hours and 1 hour  before CT scan 3 tablet 4   triamcinolone (KENALOG) 0.025 % ointment Apply 1 application topically 2 (two) times daily. 30 g 0   No current facility-administered medications for this visit.    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   LOOP RECORDER IMPLANT N/A 04/19/2014   Procedure: LOOP RECORDER IMPLANT;  Surgeon: Thompson Grayer, MD;  Location: [facility] CATH LAB;  Service: Cardiovascular;  Laterality: N/A;   OTHER SURGICAL HISTORY  2008   Gamma knife surgery to remove brain met    PORTACATH PLACEMENT  -[name]   TIP IN CAVOATRIAL JUNCTION   TEE WITHOUT CARDIOVERSION N/A 04/19/2014   Procedure: TRANSESOPHAGEAL ECHOCARDIOGRAM (TEE);  Surgeon: Larey Dresser, MD;  Location: [facility];  Service: Cardiovascular;  Laterality: N/A;    REVIEW OF SYSTEMS:  A comprehensive review of systems was negative except for: Constitutional: positive for fatigue   PHYSICAL EXAMINATION: General appearance: alert, cooperative and no distress Head: Normocephalic, without obvious abnormality, atraumatic Neck: no  adenopathy, no JVD, supple, symmetrical, trachea midline and thyroid not enlarged, symmetric, no tenderness/mass/nodules Lymph nodes: Cervical, supraclavicular, and axillary nodes normal. Resp: clear to auscultation bilaterally Back: symmetric, no curvature. ROM normal. No CVA tenderness. Cardio: regular rate and rhythm, S1, S2 normal, no murmur, click, rub or gallop GI: soft, non-tender; bowel sounds normal; no masses,  no organomegaly Extremities: extremities normal, atraumatic, no cyanosis or edema  ECOG PERFORMANCE STATUS: 1 - Symptomatic but completely ambulatory  Blood pressure (!) 151/73, pulse 97, temperature 99.1 F (37.3 C), temperature source Temporal, resp. rate 18, height _0  (1.575 m), weight 154 lb (69.9 kg), SpO2 96 %.  LABORATORY DATA: Lab Results  Component Value Date   WBC 9.2 10/06/2018   HGB 13.0 10/06/2018   HCT 40.4 10/06/2018   MCV 98.8 10/06/2018   PLT 338 10/06/2018      Chemistry      Component Value Date/Time   NA 142 10/06/2018 0923   NA 142 12/24/2016 0816  K 4.4 10/06/2018 0923   K 4.4 12/24/2016 0816   CL 107 10/06/2018 0923   CL 106 06/30/2012 1004   CO2 25 10/06/2018 0923   CO2 28 12/24/2016 0816   BUN 11 10/06/2018 0923   BUN 15.8 12/24/2016 0816   CREATININE 0.89 10/06/2018 0923   CREATININE 0.87 07/22/2017 0826   CREATININE 1.1 12/24/2016 0816      Component Value Date/Time   CALCIUM 9.8 10/06/2018 0923   CALCIUM 9.6 12/24/2016 0816   ALKPHOS 85 10/06/2018 0923   ALKPHOS 80 12/24/2016 0816   AST 24 10/06/2018 0923   AST 20 07/22/2017 0826   AST 20 12/24/2016 0816   ALT 14 10/06/2018 0923   ALT 11 07/22/2017 0826   ALT 11 12/24/2016 0816   BILITOT 0.5 10/06/2018 0923   BILITOT 0.4 07/22/2017 0826   BILITOT 0.34 12/24/2016 0816       RADIOGRAPHIC STUDIES: Ct Chest W Contrast  Result Date: 10/02/2018 CLINICAL DATA:  Multiple exams, including 03/09/2018 EXAM: CT CHEST, ABDOMEN, AND PELVIS WITH CONTRAST TECHNIQUE:  Multidetector CT imaging of the chest, abdomen and pelvis was performed following the standard protocol during bolus administration of intravenous contrast. CONTRAST:  19m OMNIPAQUE IOHEXOL 300 MG/ML  SOLN COMPARISON:  03/29/2018 FINDINGS: CT CHEST FINDINGS Cardiovascular: Right Port-A-Cath tip: Right atrium. Coronary, aortic arch, and branch vessel atherosclerotic vascular disease. Mediastinum/Nodes: No pathologic adenopathy identified. Lungs/Pleura: Chronic pleural thickening with some faint calcification posteriorly at the right lung apex, currently measuring 1.2 cm in thickness on image 16/4. This is been present chronically for over 10 years and had only low-grade activity for example on prior PET-CT of 01/19/2007. Focal pleural thickening anteriorly in the right upper lobe for example on image 41/4 measuring about 1.0 by 0.7 cm. Radiation therapy related findings are present in the adjacent lung. Given the long-chronicity of the pleural thickening in the right upper lobe this may well represent residua of prior therapy rather than active tumor. There some mild scattered scarring in both lungs. Bilateral airway thickening is present, with some segmental and subsegmental airway plugging in the right lower lobe. Severe emphysema.  Mild left apical pleuroparenchymal scarring. Musculoskeletal: Sclerosis of the right first and second ribs likely from prior radiation therapy. Mild geographic sclerosis of both humeral heads compatible with chronic avascular necrosis. Vertebra plana at T3. Vertebral augmentations at T4 and T7. These bony findings are chronic. CT ABDOMEN PELVIS FINDINGS Hepatobiliary: Unremarkable Pancreas: Unremarkable Spleen: Unremarkable Adrenals/Urinary Tract: Stable 5 mm hypodense lesion in the right kidney upper pole, technically too small to characterize although statistically likely to be benign. Otherwise unremarkable. Stomach/Bowel: Mild wall thickening in the descending colon, sigmoid  colon, and rectum although much of this appearance may be from underdistention. Low-grade colitis is not totally excluded but there is no stranding in the adjacent mesentery. Vascular/Lymphatic: Aortoiliac atherosclerotic vascular disease. No pathologic adenopathy. Reproductive: Unremarkable Other: No supplemental non-categorized findings. Musculoskeletal: Minimal findings of chronic avascular necrosis in both femoral heads. Mild left foraminal stenosis at L5-S1 due to spurring. Chronically stable faint sclerosis eccentric to the right of the L1 vertebral level. IMPRESSION: 1. Currently there are no compelling findings of active or progressive malignancy. Long-term chronic stability of the pleural thickening at the right lung apex. Adjacent radiation therapy related findings noted. Stable bony findings including faint sclerosis eccentric to the right at L1; T3 vertebral plana; and vertebral augmentations at T4 and T7. 2. Mild wall thickening in the descending colon, sigmoid colon, and rectum although much  of this may be from nondistention. Low-grade distal colitis not totally excluded. 3. Other imaging findings of potential clinical significance: Aortic Atherosclerosis (ICD10-I70.0). Coronary atherosclerosis. Emphysema (ICD10-J43.9). Scattered mild scarring in the lungs. 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 Mild left foraminal stenosis at L5-S1. Electronically Signed   By: Van Clines M.D.   On: 10/02/2018 09:05   Ct Abdomen Pelvis W Contrast  Result Date: 10/02/2018 CLINICAL DATA:  Multiple exams, including 03/09/2018 EXAM: CT CHEST, ABDOMEN, AND PELVIS WITH CONTRAST TECHNIQUE: Multidetector CT imaging of the chest, abdomen and pelvis was performed following the standard protocol during bolus administration of intravenous contrast. CONTRAST:  190m OMNIPAQUE IOHEXOL 300 MG/ML  SOLN COMPARISON:  03/29/2018 FINDINGS: CT CHEST FINDINGS Cardiovascular: Right  Port-A-Cath tip: Right atrium. Coronary, aortic arch, and branch vessel atherosclerotic vascular disease. Mediastinum/Nodes: No pathologic adenopathy identified. Lungs/Pleura: Chronic pleural thickening with some faint calcification posteriorly at the right lung apex, currently measuring 1.2 cm in thickness on image 16/4. This is been present chronically for over 10 years and had only low-grade activity for example on prior PET-CT of 01/19/2007. Focal pleural thickening anteriorly in the right upper lobe for example on image 41/4 measuring about 1.0 by 0.7 cm. Radiation therapy related findings are present in the adjacent lung. Given the long-chronicity of the pleural thickening in the right upper lobe this may well represent residua of prior therapy rather than active tumor. There some mild scattered scarring in both lungs. Bilateral airway thickening is present, with some segmental and subsegmental airway plugging in the right lower lobe. Severe emphysema.  Mild left apical pleuroparenchymal scarring. Musculoskeletal: Sclerosis of the right first and second ribs likely from prior radiation therapy. Mild geographic sclerosis of both humeral heads compatible with chronic avascular necrosis. Vertebra plana at T3. Vertebral augmentations at T4 and T7. These bony findings are chronic. CT ABDOMEN PELVIS FINDINGS Hepatobiliary: Unremarkable Pancreas: Unremarkable Spleen: Unremarkable Adrenals/Urinary Tract: Stable 5 mm hypodense lesion in the right kidney upper pole, technically too small to characterize although statistically likely to be benign. Otherwise unremarkable. Stomach/Bowel: Mild wall thickening in the descending colon, sigmoid colon, and rectum although much of this appearance may be from underdistention. Low-grade colitis is not totally excluded but there is no stranding in the adjacent mesentery. Vascular/Lymphatic: Aortoiliac atherosclerotic vascular disease. No pathologic adenopathy. Reproductive:  Unremarkable Other: No supplemental non-categorized findings. Musculoskeletal: Minimal findings of chronic avascular necrosis in both femoral heads. Mild left foraminal stenosis at L5-S1 due to spurring. Chronically stable faint sclerosis eccentric to the right of the L1 vertebral level. IMPRESSION: 1. Currently there are no compelling findings of active or progressive malignancy. Long-term chronic stability of the pleural thickening at the right lung apex. Adjacent radiation therapy related findings noted. Stable bony findings including faint sclerosis eccentric to the right at L1; T3 vertebral plana; and vertebral augmentations at T4 and T7. 2. Mild wall thickening in the descending colon, sigmoid colon, and rectum although much of this may be from nondistention. Low-grade distal colitis not totally excluded. 3. Other imaging findings of potential clinical significance: Aortic Atherosclerosis (ICD10-I70.0). Coronary atherosclerosis. Emphysema (ICD10-J43.9). Scattered mild scarring in the lungs. 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 Mild left foraminal stenosis at L5-S1. Electronically Signed   By: WVan ClinesM.D.   On: 10/02/2018 09:05    ASSESSMENT AND PLAN: This is a very pleasant 68years old white female with metastatic non-small cell lung cancer diagnosed in October  2007 status post concurrent chemoradiation followed by consolidation chemotherapy and she is currently on maintenance treatment with single agent Alimta status post 193 cycles. The patient has been tolerating her treatment well with no concerning complaints. I recommended for her to proceed with cycle #194 today as planned. For hypertension, she was advised to take her blood pressure medication as prescribed and monitor it closely at home.  The patient mentions that her blood pressure at home is usually within the normal range. For nausea, I gave her a refill of Zofran today. She will come  back for follow-up visit in 3 weeks for evaluation before the next cycle of her treatment. The patient was advised to call immediately if she has any concerning symptoms in the interval. The patient voices understanding of current disease status and treatment options and is in agreement with the current care plan. All questions were answered. The patient knows to call the clinic with any problems, questions or concerns. We can certainly see the patient much sooner if necessary.  Disclaimer: This note was dictated with voice recognition software. Similar sounding words can inadvertently be transcribed and may not be corrected upon review.

## 2018-10-28 ENCOUNTER — Telehealth: Payer: Self-pay | Admitting: Internal Medicine

## 2018-10-28 NOTE — Telephone Encounter (Signed)
Per 10/20 los appt already scheduled.

## 2018-11-05 ENCOUNTER — Encounter: Payer: Self-pay | Admitting: Family Medicine

## 2018-11-05 DIAGNOSIS — D485 Neoplasm of uncertain behavior of skin: Secondary | ICD-10-CM | POA: Diagnosis not present

## 2018-11-05 DIAGNOSIS — C44319 Basal cell carcinoma of skin of other parts of face: Secondary | ICD-10-CM | POA: Diagnosis not present

## 2018-11-11 ENCOUNTER — Other Ambulatory Visit: Payer: Self-pay | Admitting: Internal Medicine

## 2018-11-11 DIAGNOSIS — C3410 Malignant neoplasm of upper lobe, unspecified bronchus or lung: Secondary | ICD-10-CM

## 2018-11-11 DIAGNOSIS — C3490 Malignant neoplasm of unspecified part of unspecified bronchus or lung: Secondary | ICD-10-CM

## 2018-11-11 MED ORDER — MORPHINE SULFATE 30 MG PO TABS: 30.0000 mg | ORAL_TABLET | ORAL | 0 refills | Status: DC | PRN

## 2018-11-11 MED ORDER — MORPHINE SULFATE ER 60 MG PO TBCR: 60.0000 mg | EXTENDED_RELEASE_TABLET | Freq: Two times a day (BID) | ORAL | 0 refills | Status: DC

## 2018-11-17 ENCOUNTER — Other Ambulatory Visit: Payer: Self-pay

## 2018-11-17 ENCOUNTER — Telehealth: Payer: Self-pay | Admitting: Internal Medicine

## 2018-11-17 ENCOUNTER — Inpatient Hospital Stay: Payer: Medicare Other | Attending: Internal Medicine

## 2018-11-17 ENCOUNTER — Inpatient Hospital Stay: Payer: Medicare Other

## 2018-11-17 ENCOUNTER — Encounter: Payer: Self-pay | Admitting: Internal Medicine

## 2018-11-17 ENCOUNTER — Inpatient Hospital Stay (HOSPITAL_BASED_OUTPATIENT_CLINIC_OR_DEPARTMENT_OTHER): Payer: Medicare Other | Admitting: Internal Medicine

## 2018-11-17 VITALS — BP 142/61 | HR 90 | Temp 98.3°F | Resp 18 | Ht 62.0 in | Wt 152.6 lb

## 2018-11-17 DIAGNOSIS — Z7982 Long term (current) use of aspirin: Secondary | ICD-10-CM | POA: Insufficient documentation

## 2018-11-17 DIAGNOSIS — C3411 Malignant neoplasm of upper lobe, right bronchus or lung: Secondary | ICD-10-CM

## 2018-11-17 DIAGNOSIS — I10 Essential (primary) hypertension: Secondary | ICD-10-CM | POA: Insufficient documentation

## 2018-11-17 DIAGNOSIS — M797 Fibromyalgia: Secondary | ICD-10-CM | POA: Insufficient documentation

## 2018-11-17 DIAGNOSIS — Z923 Personal history of irradiation: Secondary | ICD-10-CM | POA: Insufficient documentation

## 2018-11-17 DIAGNOSIS — Z8673 Personal history of transient ischemic attack (TIA), and cerebral infarction without residual deficits: Secondary | ICD-10-CM | POA: Diagnosis not present

## 2018-11-17 DIAGNOSIS — C7931 Secondary malignant neoplasm of brain: Secondary | ICD-10-CM | POA: Diagnosis not present

## 2018-11-17 DIAGNOSIS — C3490 Malignant neoplasm of unspecified part of unspecified bronchus or lung: Secondary | ICD-10-CM

## 2018-11-17 DIAGNOSIS — Z79899 Other long term (current) drug therapy: Secondary | ICD-10-CM | POA: Diagnosis not present

## 2018-11-17 DIAGNOSIS — M47892 Other spondylosis, cervical region: Secondary | ICD-10-CM | POA: Diagnosis not present

## 2018-11-17 DIAGNOSIS — Z5111 Encounter for antineoplastic chemotherapy: Secondary | ICD-10-CM

## 2018-11-17 DIAGNOSIS — C7951 Secondary malignant neoplasm of bone: Secondary | ICD-10-CM | POA: Diagnosis not present

## 2018-11-17 LAB — CBC WITH DIFFERENTIAL/PLATELET
Abs Immature Granulocytes: 0.03 10*3/uL (ref 0.00–0.07)
Basophils Absolute: 0 10*3/uL (ref 0.0–0.1)
Basophils Relative: 0 %
Eosinophils Absolute: 0 10*3/uL (ref 0.0–0.5)
Eosinophils Relative: 0 %
HCT: 38.1 % (ref 36.0–46.0)
Hemoglobin: 12.7 g/dL (ref 12.0–15.0)
Immature Granulocytes: 0 %
Lymphocytes Relative: 15 %
Lymphs Abs: 1.6 10*3/uL (ref 0.7–4.0)
MCH: 32.6 pg (ref 26.0–34.0)
MCHC: 33.3 g/dL (ref 30.0–36.0)
MCV: 97.7 fL (ref 80.0–100.0)
Monocytes Absolute: 1.1 10*3/uL — ABNORMAL HIGH (ref 0.1–1.0)
Monocytes Relative: 10 %
Neutro Abs: 8.2 10*3/uL — ABNORMAL HIGH (ref 1.7–7.7)
Neutrophils Relative %: 75 %
Platelets: 367 10*3/uL (ref 150–400)
RBC: 3.9 MIL/uL (ref 3.87–5.11)
RDW: 13 % (ref 11.5–15.5)
WBC: 11 10*3/uL — ABNORMAL HIGH (ref 4.0–10.5)
nRBC: 0 % (ref 0.0–0.2)

## 2018-11-17 LAB — COMPREHENSIVE METABOLIC PANEL
ALT: 12 U/L (ref 0–44)
AST: 22 U/L (ref 15–41)
Albumin: 3.6 g/dL (ref 3.5–5.0)
Alkaline Phosphatase: 78 U/L (ref 38–126)
Anion gap: 11 (ref 5–15)
BUN: 11 mg/dL (ref 8–23)
CO2: 25 mmol/L (ref 22–32)
Calcium: 9.6 mg/dL (ref 8.9–10.3)
Chloride: 106 mmol/L (ref 98–111)
Creatinine, Ser: 0.86 mg/dL (ref 0.44–1.00)
GFR calc Af Amer: >60 mL/min (ref >60)
GFR calc non Af Amer: >60 mL/min (ref >60)
Glucose, Bld: 119 mg/dL — ABNORMAL HIGH (ref 70–99)
Potassium: 4.1 mmol/L (ref 3.5–5.1)
Sodium: 142 mmol/L (ref 135–145)
Total Bilirubin: 0.4 mg/dL (ref 0.3–1.2)
Total Protein: 7.1 g/dL (ref 6.5–8.1)

## 2018-11-17 MED ORDER — DEXAMETHASONE SODIUM PHOSPHATE 10 MG/ML IJ SOLN
10.0000 mg | Freq: Once | INTRAMUSCULAR | Status: AC
  Administered 2018-11-17: 10 mg via INTRAVENOUS

## 2018-11-17 MED ORDER — SODIUM CHLORIDE 0.9 % IV SOLN
Freq: Once | INTRAVENOUS | Status: AC
  Administered 2018-11-17: 10:00:00 via INTRAVENOUS
  Filled 2018-11-17: qty 250

## 2018-11-17 MED ORDER — CYANOCOBALAMIN 1000 MCG/ML IJ SOLN
1000.0000 ug | Freq: Once | INTRAMUSCULAR | Status: AC
  Administered 2018-11-17: 1000 ug via INTRAMUSCULAR

## 2018-11-17 MED ORDER — SODIUM CHLORIDE 0.9 % IV SOLN
500.0000 mg/m2 | Freq: Once | INTRAVENOUS | Status: AC
  Administered 2018-11-17: 800 mg via INTRAVENOUS
  Filled 2018-11-17: qty 20

## 2018-11-17 MED ORDER — HEPARIN SOD (PORK) LOCK FLUSH 100 UNIT/ML IV SOLN
500.0000 [IU] | Freq: Once | INTRAVENOUS | Status: AC | PRN
  Administered 2018-11-17: 500 [IU]
  Filled 2018-11-17: qty 5

## 2018-11-17 MED ORDER — ONDANSETRON HCL 8 MG PO TABS
8.0000 mg | ORAL_TABLET | Freq: Once | ORAL | Status: AC
  Administered 2018-11-17: 8 mg via ORAL

## 2018-11-17 MED ORDER — SODIUM CHLORIDE 0.9% FLUSH
10.0000 mL | INTRAVENOUS | Status: DC | PRN
  Administered 2018-11-17: 10 mL
  Filled 2018-11-17: qty 10

## 2018-11-17 MED ORDER — ONDANSETRON HCL 8 MG PO TABS
ORAL_TABLET | ORAL | Status: AC
  Filled 2018-11-17: qty 1

## 2018-11-17 MED ORDER — CYANOCOBALAMIN 1000 MCG/ML IJ SOLN
INTRAMUSCULAR | Status: AC
  Filled 2018-11-17: qty 1

## 2018-11-17 MED ORDER — DEXAMETHASONE SODIUM PHOSPHATE 10 MG/ML IJ SOLN
INTRAMUSCULAR | Status: AC
  Filled 2018-11-17: qty 1

## 2018-11-17 NOTE — Patient Instructions (Signed)
[facility] Discharge Instructions for Patients Receiving Chemotherapy  Today you received the following chemotherapy agents: Pemetrexed (Alimta)  To help prevent nausea and vomiting after your treatment, we encourage you to take your nausea medication as directed by MD.   If you develop nausea and vomiting that is not controlled by your nausea medication, call the clinic.   BELOW ARE SYMPTOMS THAT SHOULD BE REPORTED IMMEDIATELY:  *FEVER GREATER THAN 100.5 F  *CHILLS WITH OR WITHOUT FEVER  NAUSEA AND VOMITING THAT IS NOT CONTROLLED WITH YOUR NAUSEA MEDICATION  *UNUSUAL SHORTNESS OF BREATH  *UNUSUAL BRUISING OR BLEEDING  TENDERNESS IN MOUTH AND THROAT WITH OR WITHOUT PRESENCE OF ULCERS  *URINARY PROBLEMS  *BOWEL PROBLEMS  UNUSUAL RASH Items with * indicate a potential emergency and should be followed up as soon as possible.  Feel free to call the clinic should you have any questions or concerns. The clinic phone number is [phone].  Please show the [facility] at check-in to the Emergency Department and triage nurse. Coronavirus (COVID-19) Are you at risk?  Are you at risk for the Coronavirus (COVID-19)?  To be considered HIGH RISK for Coronavirus (COVID-19), you have to meet the following criteria:  . Traveled to [place], [place], [place], [place] or [place]; or in the [place] to [place], [place], [place], or [place]; and have fever, cough, and shortness of breath within the last 2 weeks of travel OR . Been in close contact with a person diagnosed with COVID-19 within the last 2 weeks and have fever, cough, and shortness of breath . IF YOU DO NOT MEET THESE CRITERIA, YOU ARE CONSIDERED LOW RISK FOR COVID-19.  What to do if you are HIGH RISK for COVID-19?  Marland Kitchen If you are having a medical emergency, call 911. . Seek medical care right away. Before you go to a doctor's office, urgent care or emergency department, call ahead and tell them  about your recent travel, contact with someone diagnosed with COVID-19, and your symptoms. You should receive instructions from your physician's office regarding next steps of care.  . When you arrive at healthcare provider, tell the healthcare staff immediately you have returned from visiting [place], [place], [place], [place] or [place]; or traveled in the [place] to [place], [place], [place], or [place]; in the last two weeks or you have been in close contact with a person diagnosed with COVID-19 in the last 2 weeks.   . Tell the health care staff about your symptoms: fever, cough and shortness of breath. . After you have been seen by a medical provider, you will be either: o Tested for (COVID-19) and discharged home on quarantine except to seek medical care if symptoms worsen, and asked to  - Stay home and avoid contact with others until you get your results (4-5 days)  - Avoid travel on public transportation if possible (such as bus, train, or airplane) or o Sent to the Emergency Department by EMS for evaluation, COVID-19 testing, and possible admission depending on your condition and test results.  What to do if you are LOW RISK for COVID-19?  Reduce your risk of any infection by using the same precautions used for avoiding the common cold or flu:  Marland Kitchen Wash your hands often with soap and warm water for at least 20 seconds.  If soap and water are not readily available, use an alcohol-based hand sanitizer with at least 60% alcohol.  . If coughing  or sneezing, cover your mouth and nose by coughing or sneezing into the elbow areas of your shirt or coat, into a tissue or into your sleeve (not your hands). . Avoid shaking hands with others and consider head nods or verbal greetings only. . Avoid touching your eyes, nose, or mouth with unwashed hands.  . Avoid close contact with people who are sick. . Avoid places or events with large numbers of people in one location, like concerts or  sporting events. . Carefully consider travel plans you have or are making. . If you are planning any travel outside or inside the [place], visit the CDC's Travelers' Health webpage for the latest health notices. . If you have some symptoms but not all symptoms, continue to monitor at home and seek medical attention if your symptoms worsen. . If you are having a medical emergency, call 911.   [facility] / e-Visit: [email or web address]         [facility]: [facility]: [phone]                   [facility]: [phone]

## 2018-11-17 NOTE — Telephone Encounter (Signed)
Scheduled per 11/10 los, patient received after visit summary and calender.

## 2018-11-17 NOTE — Telephone Encounter (Signed)
Opened by accident

## 2018-11-17 NOTE — Progress Notes (Signed)
[place] [phone]   [phone]  OFFICE PROGRESS NOTE  [name], MD [place] [address]  DIAGNOSIS: Metastatic non-small cell lung cancer initially diagnosed as locally advanced stage IIIB with a right Pancoast tumor involving the vertebral body as well as prominent canal invasion with spinal cord compression in October 2007. The patient also has metastatic disease to the brain in April 2008.  PRIOR THERAPY: 1. Status post concurrent chemoradiation with weekly carboplatin and paclitaxel, last dose was given November 18, 2005. 2. 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 3. Status post gamma knife radiotherapy to a solitary brain lesion located in the superior frontal area of the brain at [facility] in April of 2008. 4. Status post palliative radiotherapy to the lateral abdominal wall metastatic lesion under the care of Dr. Lisbeth Renshaw, completed March of 2009. 5. Status post 6 cycles of systemic chemotherapy with carboplatin and Alimta. Last dose was given July 26, 2007 with disease stabilization. 6. Gamma knife stereotactic radiotherapy to 2 brain lesions one involving the right frontal dural based as well as right parietal lesion performed on 05/07/2012 under the care of Dr. Vallarie Mare at [facility].  CURRENT THERAPY: Maintenance systemic chemotherapy with Alimta 500 MG/M2 every 3 weeks, status post 194 cycles.  INTERVAL HISTORY: Danielle [name] 68 y.o. female returns to the clinic today for follow-up visit.  The patient is feeling fine today with no concerning complaints except for occasional nausea.  She denied having any chest pain, shortness of breath, cough or hemoptysis.  She denied having any fever or chills.  She has no current nausea, vomiting, diarrhea or constipation.  She has no headache or visual changes.  She denied having  any recent weight loss or night sweats.  The patient is here today for evaluation before starting cycle #195.  MEDICAL HISTORY: Past Medical History:  Diagnosis Date  . Anemia   . 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 CVA (cerebral vascular accident) ([place]) 04/2014   pt states had 2 cva's within 2 wks  . DJD (degenerative joint disease), cervical   . Fibromyalgia   . H/O: pneumonia   . History of tobacco abuse quit 10/08   on nicotine patch  . Hypertension   . Hypokalemia   . Lung cancer ([place]) dx'd 09/2005   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 had chemo and radiation for lung ca  . Thrush 12/11/2010    ALLERGIES:  is allergic to contrast media [iodinated diagnostic agents]; iohexol; sulfa drugs cross reactors; and co-trimoxazole injection [sulfamethoxazole-trimethoprim].  MEDICATIONS:  Current Outpatient Medications  Medication Sig Dispense Refill  . amLODipine (NORVASC) 10 MG tablet TAKE 1 TABLET (10 MG TOTAL) BY MOUTH DAILY. 90 tablet 3  . aspirin 325 MG EC tablet TAKE 1 TABLET BY MOUTH EVERY DAY 90 tablet 0  . atorvastatin (LIPITOR) 40 MG tablet TAKE 1 TABLET BY MOUTH EVERY DAY AT 6 PM 90 tablet 3  . diphenhydrAMINE (BENADRYL) 25 MG tablet Take 1 tablet (25 mg total) by mouth as directed. 2 hours before CT scan. 5 tablet 0  . FeFum-FePoly-FA-B Cmp-C-Biot (INTEGRA PLUS) CAPS Take 1 capsule by mouth every morning. 90 capsule 3  . fluticasone (FLONASE) 50 MCG/ACT nasal spray Place 1-2 sprays into both nostrils daily as needed. 16 g 5  . folic acid (FOLVITE) 1 MG tablet TAKE 1 TABLET BY MOUTH  EVERY DAY 90 tablet 1  . furosemide (LASIX) 20 MG tablet TAKE 1 TABLET BY MOUTH EVERY DAY AS NEEDED FOR SWELLING 20 tablet 1  . lacosamide (VIMPAT) 50 MG TABS tablet Take by mouth.    . lidocaine-prilocaine (EMLA) cream APPLY TO PORT-A-CATH AS DIRECTED 1-2 HOURS PRIOR TO CHEMOTHERAPY 30 g 1  . lisinopril (PRINIVIL,ZESTRIL) 20 MG tablet TAKE 2 TABLETS (40 MG TOTAL) BY MOUTH  DAILY. 180 tablet 3  . morphine (MS CONTIN) 60 MG 12 hr tablet Take 1 tablet (60 mg total) by mouth 2 (two) times daily. 60 tablet 0  . morphine (MSIR) 30 MG tablet Take 1 tablet (30 mg total) by mouth every 4 (four) hours as needed (breakthrough pain). 60 tablet 0  . Olopatadine HCl 0.2 % SOLN INSTILL 1 DROP INTO AFFECTED EYE(S) EVERY DAY 2.5 mL 1  . ondansetron (ZOFRAN) 8 MG tablet TAKE 1 TABLET BY MOUTH EVERY 8 HOURS AS NEEDED FOR NAUSEA AND VOMITING 30 tablet 0  . predniSONE (DELTASONE) 50 MG tablet 1 tablet by mouth 13 hours ,7 hours and 1 hour  before CT scan 3 tablet 4  . triamcinolone (KENALOG) 0.025 % ointment Apply 1 application topically 2 (two) times daily. 30 g 0   No current facility-administered medications for this visit.     SURGICAL HISTORY:  Past Surgical History:  Procedure Laterality Date  . CERVICAL LAMINECTOMY  1995  . KNEE SURGERY  1990   Left x 2  . 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 LOOP RECORDER IMPLANT N/A 04/19/2014   Procedure: LOOP RECORDER IMPLANT;  Surgeon: Thompson Grayer, MD;  Location: [facility] CATH LAB;  Service: Cardiovascular;  Laterality: N/A;  . OTHER SURGICAL HISTORY  2008   Gamma knife surgery to remove brain met   . PORTACATH PLACEMENT  -[name]   TIP IN CAVOATRIAL JUNCTION  . TEE WITHOUT CARDIOVERSION N/A 04/19/2014   Procedure: TRANSESOPHAGEAL ECHOCARDIOGRAM (TEE);  Surgeon: Larey Dresser, MD;  Location: [place];  Service: Cardiovascular;  Laterality: N/A;    REVIEW OF SYSTEMS:  A comprehensive review of systems was negative except for: Constitutional: positive for fatigue Gastrointestinal: positive for nausea   PHYSICAL EXAMINATION: General appearance: alert, cooperative and no distress Head: Normocephalic, without obvious abnormality, atraumatic Neck: no adenopathy, no JVD, supple, symmetrical, trachea midline and thyroid not enlarged, symmetric, no tenderness/mass/nodules Lymph nodes: Cervical,  supraclavicular, and axillary nodes normal. Resp: clear to auscultation bilaterally Back: symmetric, no curvature. ROM normal. No CVA tenderness. Cardio: regular rate and rhythm, S1, S2 normal, no murmur, click, rub or gallop GI: soft, non-tender; bowel sounds normal; no masses,  no organomegaly Extremities: extremities normal, atraumatic, no cyanosis or edema  ECOG PERFORMANCE STATUS: 1 - Symptomatic but completely ambulatory  Blood pressure (!) 142/61, pulse 90, temperature 98.3 F (36.8 C), temperature source Temporal, resp. rate 18, height '5\' 2"'  (1.575 m), weight 152 lb 9.6 oz (69.2 kg), SpO2 95 %.  LABORATORY DATA: Lab Results  Component Value Date   WBC 11.0 (H) 11/17/2018   HGB 12.7 11/17/2018   HCT 38.1 11/17/2018   MCV 97.7 11/17/2018   PLT 367 11/17/2018      Chemistry      Component Value Date/Time   NA 143 10/27/2018 0854   NA 142 12/24/2016 0816   K 4.2 10/27/2018 0854   K 4.4 12/24/2016 0816   CL 106 10/27/2018 0854   CL 106 06/30/2012 1004   CO2  25 10/27/2018 0854   CO2 28 12/24/2016 0816   BUN 9 10/27/2018 0854   BUN 15.8 12/24/2016 0816   CREATININE 0.75 10/27/2018 0854   CREATININE 0.87 07/22/2017 0826   CREATININE 1.1 12/24/2016 0816      Component Value Date/Time   CALCIUM 9.4 10/27/2018 0854   CALCIUM 9.6 12/24/2016 0816   ALKPHOS 86 10/27/2018 0854   ALKPHOS 80 12/24/2016 0816   AST 22 10/27/2018 0854   AST 20 07/22/2017 0826   AST 20 12/24/2016 0816   ALT 13 10/27/2018 0854   ALT 11 07/22/2017 0826   ALT 11 12/24/2016 0816   BILITOT 0.4 10/27/2018 0854   BILITOT 0.4 07/22/2017 0826   BILITOT 0.34 12/24/2016 0816       RADIOGRAPHIC STUDIES: No results found.  ASSESSMENT AND PLAN: This is a very pleasant 68 years old white female with metastatic non-small cell lung cancer diagnosed in October 2007 status post concurrent chemoradiation followed by consolidation chemotherapy and she is currently on maintenance treatment with single agent  Alimta status post 194 cycles. The patient continues to tolerate this treatment well with no concerning adverse effects. I recommended for the patient to proceed with cycle #195 today as planned. She will come back for follow-up visit in 3 weeks for evaluation before the next cycle of her treatment. For hypertension, she will continue to monitor it closely at home. She was advised to call immediately if she has any concerning symptoms in the interval. The patient voices understanding of current disease status and treatment options and is in agreement with the current care plan. All questions were answered. The patient knows to call the clinic with any problems, questions or concerns. We can certainly see the patient much sooner if necessary.  Disclaimer: This note was dictated with voice recognition software. Similar sounding words can inadvertently be transcribed and may not be corrected upon review.

## 2018-12-07 NOTE — Progress Notes (Signed)
[place] OFFICE PROGRESS NOTE  Danielle Sanders, MD [place] [address]  DIAGNOSIS: Metastatic non-small cell lung cancer initially diagnosed as locally advanced stage IIIB with a right Pancoast tumor involving the vertebral body as well as prominent canal invasion with spinal cord compression in October 2007. The patient also has metastatic disease to the brain in April 2008.  PRIOR THERAPY: 1. Status post concurrent chemoradiation with weekly carboplatin and paclitaxel, last dose was given November 18, 2005. 2. 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 3. Status post gamma knife radiotherapy to a solitary brain lesion located in the superior frontal area of the brain at [facility] in April of 2008. 4. Status post palliative radiotherapy to the lateral abdominal wall metastatic lesion under the care of Dr. Lisbeth Renshaw, completed March of 2009. 5. Status post 6 cycles of systemic chemotherapy with carboplatin and Alimta. Last dose was given July 26, 2007 with disease stabilization. 6. Gamma knife stereotactic radiotherapy to 2 brain lesions one involving the right frontal dural based as well as right parietal lesion performed on 05/07/2012 under the care of Dr. Vallarie Mare at [facility].  CURRENT THERAPY: Maintenance systemic chemotherapy with Alimta 500 MG/M2 every 3 weeks, status post 195cycles.  INTERVAL HISTORY: Danielle [name] 68 y.o. female returns to the clinic for a follow up visit. The patient is feeling well today without any concerning complaints except she has a mild "head cold" with nasal congestion. She denies any fever, chills, loss of taste/smell, body aches, sore throat, unusual cough, headaches, or shortness of breath. The patient continues to tolerate treatment with maintenance Alimta well well without any adverse side effects except for occasional  nausea/vomiting. Denies any night sweats or weight loss. Denies any chest pain or hemoptysis.  She reports her baseline shortness of breath with exertion and baseline cough. Denies any diarrhea or constipation. The patient is here today for evaluation prior to starting cycle #196   MEDICAL HISTORY: Past Medical History:  Diagnosis Date  . Anemia   . 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 CVA (cerebral vascular accident) ([place]) 04/2014   pt states had 2 cva's within 2 wks  . DJD (degenerative joint disease), cervical   . Fibromyalgia   . H/O: pneumonia   . History of tobacco abuse quit 10/08   on nicotine patch  . Hypertension   . Hypokalemia   . Lung cancer ([place]) dx'd 09/2005   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 had chemo and radiation for lung ca  . Thrush 12/11/2010    ALLERGIES:  is allergic to contrast media [iodinated diagnostic agents]; iohexol; sulfa drugs cross reactors; and co-trimoxazole injection [sulfamethoxazole-trimethoprim].  MEDICATIONS:  Current Outpatient Medications  Medication Sig Dispense Refill  . amLODipine (NORVASC) 10 MG tablet TAKE 1 TABLET (10 MG TOTAL) BY MOUTH DAILY. 90 tablet 3  . aspirin 325 MG EC tablet TAKE 1 TABLET BY MOUTH EVERY DAY 90 tablet 0  . atorvastatin (LIPITOR) 40 MG tablet TAKE 1 TABLET BY MOUTH EVERY DAY AT 6 PM 90 tablet 3  . diphenhydrAMINE (BENADRYL) 25 MG tablet Take 1 tablet (25 mg total) by mouth as directed. 2 hours before CT scan. 5 tablet 0  . FeFum-FePoly-FA-B Cmp-C-Biot (INTEGRA PLUS) CAPS Take 1 capsule by mouth every morning. 90 capsule 3  . fluticasone (FLONASE) 50 MCG/ACT nasal spray Place 1-2 sprays into both nostrils daily as needed. [place]  g 5  . folic acid (FOLVITE) 1 MG tablet TAKE 1 TABLET BY MOUTH EVERY DAY 90 tablet 1  . furosemide (LASIX) 20 MG tablet TAKE 1 TABLET BY MOUTH EVERY DAY AS NEEDED FOR SWELLING 20 tablet 1  . lidocaine-prilocaine (EMLA) cream APPLY TO PORT-A-CATH AS DIRECTED 1-2 HOURS  PRIOR TO CHEMOTHERAPY 30 g 1  . lisinopril (PRINIVIL,ZESTRIL) 20 MG tablet TAKE 2 TABLETS (40 MG TOTAL) BY MOUTH DAILY. 180 tablet 3  . morphine (MS CONTIN) 60 MG 12 hr tablet Take 1 tablet (60 mg total) by mouth 2 (two) times daily. 60 tablet 0  . morphine (MSIR) 30 MG tablet Take 1 tablet (30 mg total) by mouth every 4 (four) hours as needed (breakthrough pain). 60 tablet 0  . Olopatadine HCl 0.2 % SOLN INSTILL 1 DROP INTO AFFECTED EYE(S) EVERY DAY 2.5 mL 1  . ondansetron (ZOFRAN) 8 MG tablet TAKE 1 TABLET BY MOUTH EVERY 8 HOURS AS NEEDED FOR NAUSEA AND VOMITING 30 tablet 0  . predniSONE (DELTASONE) 50 MG tablet 1 tablet by mouth 13 hours ,7 hours and 1 hour  before CT scan 3 tablet 4  . triamcinolone (KENALOG) 0.025 % ointment Apply 1 application topically 2 (two) times daily. 30 g 0   No current facility-administered medications for this visit.     SURGICAL HISTORY:  Past Surgical History:  Procedure Laterality Date  . CERVICAL LAMINECTOMY  1995  . KNEE SURGERY  1990   Left x 2  . 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 LOOP RECORDER IMPLANT N/A 04/19/2014   Procedure: LOOP RECORDER IMPLANT;  Surgeon: Thompson Grayer, MD;  [facility] C[facility] LAB;  Service: Cardiovascular;  Laterality: N/A;  . OTHER SURGICAL HISTORY  2008   Gamma knife surgery to remove brain met   . PORTACATH PLACEMENT  -[name]   TIP IN CAVOATRIAL JUNCTION  . TEE WITHOUT CARDIOVERSION N/A 04/19/2014   Procedure: TRANSESOPHAGEAL ECHOCARDIOGRAM (TEE);  Surgeon: Larey Dresser, MD;  Location: [place];  Service: Cardiovascular;  Laterality: N/A;    REVIEW OF SYSTEMS:   Review of Systems  Constitutional: Negative for appetite change, chills, fatigue, fever and unexpected weight change.  HENT: Positive for nasal congestion. Negative for mouth sores, nosebleeds, sore throat and trouble swallowing.   Eyes: Negative for eye problems and icterus.  Respiratory: Positive for baseline shortness  of breath with exertion and baseline cough. Negative for cough, hemoptysis,  and wheezing.   Cardiovascular: Negative for chest pain and leg swelling.  Gastrointestinal: Positive for occasional nausea and vomiting. Negative for abdominal pain, constipation, and diarrhea. Genitourinary: Negative for bladder incontinence, difficulty urinating, dysuria, frequency and hematuria.   Musculoskeletal: Negative for back pain, gait problem, neck pain and neck stiffness.  Skin: Positive for several scars on her skin. Negative for itching and rash.  Neurological: Negative for dizziness, extremity weakness, gait problem, headaches, light-headedness and seizures.  Hematological: Negative for adenopathy. Does not bruise/bleed easily.  Psychiatric/Behavioral: Negative for confusion, depression and sleep disturbance. The patient is not nervous/anxious.     PHYSICAL EXAMINATION:  Blood pressure (!) 149/61, pulse 87, temperature 99.2 F (37.3 C), temperature source Temporal, resp. rate 17, height '5\' 2"'  (1.575 m), weight 154 lb 3.2 oz (69.9 kg), SpO2 95 %.  ECOG PERFORMANCE STATUS: 1 - Symptomatic but completely ambulatory  Physical Exam  Constitutional: Oriented to person, place, and time and well-developed, well-nourished, and in no distress.  HENT:  Head: Normocephalic  and atraumatic.  Mouth/Throat: Oropharynx is clear and moist. No oropharyngeal exudate.  Eyes: Conjunctivae are normal. Right eye exhibits no discharge. Left eye exhibits no discharge. No scleral icterus.  Neck: Normal range of motion. Neck supple.  Cardiovascular: Normal rate, regular rhythm, normal heart sounds and intact distal pulses.   Pulmonary/Chest: Effort normal and breath sounds normal. No respiratory distress. No wheezes. No rales.  Abdominal: Soft. Bowel sounds are normal. Exhibits no distension and no mass. There is no tenderness.  Musculoskeletal: Normal range of motion. Exhibits no edema.  Lymphadenopathy:    No cervical  adenopathy.  Neurological: Mild drooping of right eye from prior hx of brain tumor. Alert and oriented to person, place, and time. Exhibits normal muscle tone. Gait normal. Coordination normal.  Skin: Skin is warm and dry. No rash noted. Not diaphoretic. No erythema. No pallor.  Psychiatric: Mood, memory and judgment normal.  Vitals reviewed.  LABORATORY DATA: Lab Results  Component Value Date   WBC 11.8 (H) 12/08/2018   HGB 12.0 12/08/2018   HCT 36.7 12/08/2018   MCV 96.6 12/08/2018   PLT 386 12/08/2018      Chemistry      Component Value Date/Time   NA 141 12/08/2018 0758   NA 142 12/24/2016 0816   K 4.4 12/08/2018 0758   K 4.4 12/24/2016 0816   CL 106 12/08/2018 0758   CL 106 06/30/2012 1004   CO2 26 12/08/2018 0758   CO2 28 12/24/2016 0816   BUN 11 12/08/2018 0758   BUN 15.8 12/24/2016 0816   CREATININE 0.79 12/08/2018 0758   CREATININE 0.87 07/22/2017 0826   CREATININE 1.1 12/24/2016 0816      Component Value Date/Time   CALCIUM 9.5 12/08/2018 0758   CALCIUM 9.6 12/24/2016 0816   ALKPHOS 87 12/08/2018 0758   ALKPHOS 80 12/24/2016 0816   AST 21 12/08/2018 0758   AST 20 07/22/2017 0826   AST 20 12/24/2016 0816   ALT 13 12/08/2018 0758   ALT 11 07/22/2017 0826   ALT 11 12/24/2016 0816   BILITOT 0.5 12/08/2018 0758   BILITOT 0.4 07/22/2017 0826   BILITOT 0.34 12/24/2016 0816       RADIOGRAPHIC STUDIES:  No results found.   ASSESSMENT/PLAN:  This is a very pleasant 68 year old Caucasian female with metastatic non-small cell lung cancer who was initially diagnosed in October 2007.  She is status post current chemoradiation followed by consolidation chemotherapy.  She is currently undergoing weekly maintainance chemotherapy with single agent Alimta 500 mg/m.  She is status post 195 cycles. She is tolerating treatment well without any concerning complaints.   The patient was seen with Dr. Julien Nordmann today. Labs were reviewed with the patient. Recommend that  she proceed with cycle #196 today as scheduled.   We will see her back for a follow up visit in 3 weeks for evaluation before starting cycle #197.   The patient was advised to call immediately if she has any concerning symptoms in the interval. The patient voices understanding of current disease status and treatment options and is in agreement with the current care plan. All questions were answered. The patient knows to call the clinic with any problems, questions or concerns. We can certainly see the patient much sooner if necessary   No orders of the defined types were placed in this encounter.    Cassandra L Heilingoetter, PA-C 12/08/18  ADDENDUM: Hematology/Oncology Attending: I had a face-to-face encounter with the patient today.  I recommended her care  plan.  This is a very pleasant 68 years old white female with metastatic non-small cell lung cancer diagnosed in October 2007 status post several chemotherapy regimens as well as radiation.  The patient is currently on maintenance treatment with single agent Alimta every 3 weeks status post 195 cycles. She continues to tolerate this treatment well with no concerning adverse effects. I recommended for her to proceed with cycle #196 today as planned. The patient will come back for follow-up visit in 3 weeks for evaluation before the next treatment. She was advised to call immediately if she has any other concerning symptoms in the interval.  Disclaimer: This note was dictated with voice recognition software. Similar sounding words can inadvertently be transcribed and may be missed upon review. Eilleen Kempf, MD 12/08/18

## 2018-12-08 ENCOUNTER — Inpatient Hospital Stay (HOSPITAL_BASED_OUTPATIENT_CLINIC_OR_DEPARTMENT_OTHER): Payer: Medicare Other | Admitting: Physician Assistant

## 2018-12-08 ENCOUNTER — Encounter: Payer: Self-pay | Admitting: Physician Assistant

## 2018-12-08 ENCOUNTER — Inpatient Hospital Stay: Payer: Medicare Other

## 2018-12-08 ENCOUNTER — Inpatient Hospital Stay: Payer: Medicare Other | Attending: Internal Medicine

## 2018-12-08 ENCOUNTER — Other Ambulatory Visit: Payer: Self-pay

## 2018-12-08 VITALS — BP 149/61 | HR 87 | Temp 99.2°F | Resp 17 | Ht 62.0 in | Wt 154.2 lb

## 2018-12-08 DIAGNOSIS — I10 Essential (primary) hypertension: Secondary | ICD-10-CM | POA: Insufficient documentation

## 2018-12-08 DIAGNOSIS — Z79899 Other long term (current) drug therapy: Secondary | ICD-10-CM | POA: Diagnosis not present

## 2018-12-08 DIAGNOSIS — Z8673 Personal history of transient ischemic attack (TIA), and cerebral infarction without residual deficits: Secondary | ICD-10-CM | POA: Insufficient documentation

## 2018-12-08 DIAGNOSIS — Z5111 Encounter for antineoplastic chemotherapy: Secondary | ICD-10-CM | POA: Diagnosis not present

## 2018-12-08 DIAGNOSIS — C3490 Malignant neoplasm of unspecified part of unspecified bronchus or lung: Secondary | ICD-10-CM

## 2018-12-08 DIAGNOSIS — Z95828 Presence of other vascular implants and grafts: Secondary | ICD-10-CM

## 2018-12-08 DIAGNOSIS — Z87891 Personal history of nicotine dependence: Secondary | ICD-10-CM | POA: Diagnosis not present

## 2018-12-08 DIAGNOSIS — Z923 Personal history of irradiation: Secondary | ICD-10-CM | POA: Diagnosis not present

## 2018-12-08 DIAGNOSIS — C3411 Malignant neoplasm of upper lobe, right bronchus or lung: Secondary | ICD-10-CM

## 2018-12-08 DIAGNOSIS — M797 Fibromyalgia: Secondary | ICD-10-CM | POA: Diagnosis not present

## 2018-12-08 DIAGNOSIS — M479 Spondylosis, unspecified: Secondary | ICD-10-CM | POA: Diagnosis not present

## 2018-12-08 DIAGNOSIS — Z7982 Long term (current) use of aspirin: Secondary | ICD-10-CM | POA: Insufficient documentation

## 2018-12-08 DIAGNOSIS — C7931 Secondary malignant neoplasm of brain: Secondary | ICD-10-CM | POA: Insufficient documentation

## 2018-12-08 LAB — COMPREHENSIVE METABOLIC PANEL
ALT: 13 U/L (ref 0–44)
AST: 21 U/L (ref 15–41)
Albumin: 3.5 g/dL (ref 3.5–5.0)
Alkaline Phosphatase: 87 U/L (ref 38–126)
Anion gap: 9 (ref 5–15)
BUN: 11 mg/dL (ref 8–23)
CO2: 26 mmol/L (ref 22–32)
Calcium: 9.5 mg/dL (ref 8.9–10.3)
Chloride: 106 mmol/L (ref 98–111)
Creatinine, Ser: 0.79 mg/dL (ref 0.44–1.00)
GFR calc Af Amer: >60 mL/min (ref >60)
GFR calc non Af Amer: >60 mL/min (ref >60)
Glucose, Bld: 133 mg/dL — ABNORMAL HIGH (ref 70–99)
Potassium: 4.4 mmol/L (ref 3.5–5.1)
Sodium: 141 mmol/L (ref 135–145)
Total Bilirubin: 0.5 mg/dL (ref 0.3–1.2)
Total Protein: 7 g/dL (ref 6.5–8.1)

## 2018-12-08 LAB — CBC WITH DIFFERENTIAL/PLATELET
Abs Immature Granulocytes: 0.04 10*3/uL (ref 0.00–0.07)
Basophils Absolute: 0.1 10*3/uL (ref 0.0–0.1)
Basophils Relative: 0 %
Eosinophils Absolute: 0 10*3/uL (ref 0.0–0.5)
Eosinophils Relative: 0 %
HCT: 36.7 % (ref 36.0–46.0)
Hemoglobin: 12 g/dL (ref 12.0–15.0)
Immature Granulocytes: 0 %
Lymphocytes Relative: 15 %
Lymphs Abs: 1.8 10*3/uL (ref 0.7–4.0)
MCH: 31.6 pg (ref 26.0–34.0)
MCHC: 32.7 g/dL (ref 30.0–36.0)
MCV: 96.6 fL (ref 80.0–100.0)
Monocytes Absolute: 1.4 10*3/uL — ABNORMAL HIGH (ref 0.1–1.0)
Monocytes Relative: 12 %
Neutro Abs: 8.5 10*3/uL — ABNORMAL HIGH (ref 1.7–7.7)
Neutrophils Relative %: 73 %
Platelets: 386 10*3/uL (ref 150–400)
RBC: 3.8 MIL/uL — ABNORMAL LOW (ref 3.87–5.11)
RDW: 13.2 % (ref 11.5–15.5)
WBC: 11.8 10*3/uL — ABNORMAL HIGH (ref 4.0–10.5)
nRBC: 0 % (ref 0.0–0.2)

## 2018-12-08 MED ORDER — SODIUM CHLORIDE 0.9% FLUSH
10.0000 mL | INTRAVENOUS | Status: DC | PRN
  Administered 2018-12-08: 10 mL via INTRAVENOUS
  Filled 2018-12-08: qty 10

## 2018-12-08 MED ORDER — ONDANSETRON HCL 8 MG PO TABS
8.0000 mg | ORAL_TABLET | Freq: Once | ORAL | Status: AC
  Administered 2018-12-08: 8 mg via ORAL

## 2018-12-08 MED ORDER — ONDANSETRON HCL 8 MG PO TABS
ORAL_TABLET | ORAL | Status: AC
  Filled 2018-12-08: qty 1

## 2018-12-08 MED ORDER — HEPARIN SOD (PORK) LOCK FLUSH 100 UNIT/ML IV SOLN
500.0000 [IU] | Freq: Once | INTRAVENOUS | Status: DC
  Filled 2018-12-08: qty 5

## 2018-12-08 MED ORDER — DEXAMETHASONE SODIUM PHOSPHATE 10 MG/ML IJ SOLN
10.0000 mg | Freq: Once | INTRAMUSCULAR | Status: AC
  Administered 2018-12-08: 10 mg via INTRAVENOUS

## 2018-12-08 MED ORDER — DEXAMETHASONE SODIUM PHOSPHATE 10 MG/ML IJ SOLN
INTRAMUSCULAR | Status: AC
  Filled 2018-12-08: qty 1

## 2018-12-08 MED ORDER — SODIUM CHLORIDE 0.9% FLUSH
10.0000 mL | INTRAVENOUS | Status: DC | PRN
  Administered 2018-12-08: 10 mL
  Filled 2018-12-08: qty 10

## 2018-12-08 MED ORDER — HEPARIN SOD (PORK) LOCK FLUSH 100 UNIT/ML IV SOLN
500.0000 [IU] | Freq: Once | INTRAVENOUS | Status: AC | PRN
  Administered 2018-12-08: 10:00:00 500 [IU]
  Filled 2018-12-08: qty 5

## 2018-12-08 MED ORDER — SODIUM CHLORIDE 0.9 % IV SOLN
500.0000 mg/m2 | Freq: Once | INTRAVENOUS | Status: AC
  Administered 2018-12-08: 800 mg via INTRAVENOUS
  Filled 2018-12-08: qty 20

## 2018-12-08 MED ORDER — SODIUM CHLORIDE 0.9 % IV SOLN
Freq: Once | INTRAVENOUS | Status: AC
  Administered 2018-12-08: 09:00:00 via INTRAVENOUS
  Filled 2018-12-08: qty 250

## 2018-12-08 NOTE — Patient Instructions (Signed)
[place] Discharge Instructions for Patients Receiving Chemotherapy  Today you received the following chemotherapy agents: Pemetrexed (Alimta)  To help prevent nausea and vomiting after your treatment, we encourage you to take your nausea medication as directed by MD.   If you develop nausea and vomiting that is not controlled by your nausea medication, call the clinic.   BELOW ARE SYMPTOMS THAT SHOULD BE REPORTED IMMEDIATELY:  *FEVER GREATER THAN 100.5 F  *CHILLS WITH OR WITHOUT FEVER  NAUSEA AND VOMITING THAT IS NOT CONTROLLED WITH YOUR NAUSEA MEDICATION  *UNUSUAL SHORTNESS OF BREATH  *UNUSUAL BRUISING OR BLEEDING  TENDERNESS IN MOUTH AND THROAT WITH OR WITHOUT PRESENCE OF ULCERS  *URINARY PROBLEMS  *BOWEL PROBLEMS  UNUSUAL RASH Items with * indicate a potential emergency and should be followed up as soon as possible.  Feel free to call the clinic should you have any questions or concerns. The clinic phone number is [phone].  Please show the [place] at check-in to the Emergency Department and triage nurse.

## 2018-12-09 ENCOUNTER — Other Ambulatory Visit: Payer: Self-pay | Admitting: Internal Medicine

## 2018-12-09 DIAGNOSIS — C3411 Malignant neoplasm of upper lobe, right bronchus or lung: Secondary | ICD-10-CM

## 2018-12-09 DIAGNOSIS — C7931 Secondary malignant neoplasm of brain: Secondary | ICD-10-CM

## 2018-12-14 ENCOUNTER — Other Ambulatory Visit: Payer: Self-pay | Admitting: Internal Medicine

## 2018-12-14 ENCOUNTER — Telehealth: Payer: Self-pay

## 2018-12-14 DIAGNOSIS — C3490 Malignant neoplasm of unspecified part of unspecified bronchus or lung: Secondary | ICD-10-CM

## 2018-12-14 DIAGNOSIS — C3410 Malignant neoplasm of upper lobe, unspecified bronchus or lung: Secondary | ICD-10-CM

## 2018-12-14 MED ORDER — MORPHINE SULFATE 30 MG PO TABS: 30.0000 mg | ORAL_TABLET | ORAL | 0 refills | Status: DC | PRN

## 2018-12-14 MED ORDER — MORPHINE SULFATE ER 60 MG PO TBCR: 60.0000 mg | EXTENDED_RELEASE_TABLET | Freq: Two times a day (BID) | ORAL | 0 refills | Status: DC

## 2018-12-14 NOTE — Telephone Encounter (Signed)
Patient called requesting refill for her morphine

## 2018-12-26 ENCOUNTER — Other Ambulatory Visit: Payer: Self-pay | Admitting: Internal Medicine

## 2018-12-26 DIAGNOSIS — D649 Anemia, unspecified: Secondary | ICD-10-CM

## 2018-12-29 ENCOUNTER — Other Ambulatory Visit: Payer: Self-pay

## 2018-12-29 ENCOUNTER — Inpatient Hospital Stay (HOSPITAL_BASED_OUTPATIENT_CLINIC_OR_DEPARTMENT_OTHER): Payer: Medicare Other | Admitting: Internal Medicine

## 2018-12-29 ENCOUNTER — Telehealth: Payer: Self-pay | Admitting: Internal Medicine

## 2018-12-29 ENCOUNTER — Encounter: Payer: Self-pay | Admitting: Internal Medicine

## 2018-12-29 ENCOUNTER — Inpatient Hospital Stay: Payer: Medicare Other

## 2018-12-29 VITALS — BP 129/60 | HR 70

## 2018-12-29 VITALS — BP 184/64 | HR 100 | Temp 98.5°F | Resp 17 | Ht 62.0 in | Wt 155.7 lb

## 2018-12-29 DIAGNOSIS — Z95828 Presence of other vascular implants and grafts: Secondary | ICD-10-CM

## 2018-12-29 DIAGNOSIS — C3490 Malignant neoplasm of unspecified part of unspecified bronchus or lung: Secondary | ICD-10-CM

## 2018-12-29 DIAGNOSIS — Z5111 Encounter for antineoplastic chemotherapy: Secondary | ICD-10-CM | POA: Diagnosis not present

## 2018-12-29 DIAGNOSIS — I10 Essential (primary) hypertension: Secondary | ICD-10-CM | POA: Diagnosis not present

## 2018-12-29 DIAGNOSIS — C3411 Malignant neoplasm of upper lobe, right bronchus or lung: Secondary | ICD-10-CM

## 2018-12-29 LAB — CBC WITH DIFFERENTIAL/PLATELET
Abs Immature Granulocytes: 0.03 10*3/uL (ref 0.00–0.07)
Basophils Absolute: 0 10*3/uL (ref 0.0–0.1)
Basophils Relative: 1 %
Eosinophils Absolute: 0.1 10*3/uL (ref 0.0–0.5)
Eosinophils Relative: 1 %
HCT: 38.1 % (ref 36.0–46.0)
Hemoglobin: 12.2 g/dL (ref 12.0–15.0)
Immature Granulocytes: 0 %
Lymphocytes Relative: 19 %
Lymphs Abs: 1.6 10*3/uL (ref 0.7–4.0)
MCH: 32 pg (ref 26.0–34.0)
MCHC: 32 g/dL (ref 30.0–36.0)
MCV: 100 fL (ref 80.0–100.0)
Monocytes Absolute: 0.8 10*3/uL (ref 0.1–1.0)
Monocytes Relative: 9 %
Neutro Abs: 5.9 10*3/uL (ref 1.7–7.7)
Neutrophils Relative %: 70 %
Platelets: 294 10*3/uL (ref 150–400)
RBC: 3.81 MIL/uL — ABNORMAL LOW (ref 3.87–5.11)
RDW: 13.6 % (ref 11.5–15.5)
WBC: 8.4 10*3/uL (ref 4.0–10.5)
nRBC: 0 % (ref 0.0–0.2)

## 2018-12-29 LAB — COMPREHENSIVE METABOLIC PANEL
ALT: 13 U/L (ref 0–44)
AST: 23 U/L (ref 15–41)
Albumin: 3.5 g/dL (ref 3.5–5.0)
Alkaline Phosphatase: 84 U/L (ref 38–126)
Anion gap: 10 (ref 5–15)
BUN: 12 mg/dL (ref 8–23)
CO2: 25 mmol/L (ref 22–32)
Calcium: 9.1 mg/dL (ref 8.9–10.3)
Chloride: 108 mmol/L (ref 98–111)
Creatinine, Ser: 0.75 mg/dL (ref 0.44–1.00)
GFR calc Af Amer: >60 mL/min (ref >60)
GFR calc non Af Amer: >60 mL/min (ref >60)
Glucose, Bld: 146 mg/dL — ABNORMAL HIGH (ref 70–99)
Potassium: 4.2 mmol/L (ref 3.5–5.1)
Sodium: 143 mmol/L (ref 135–145)
Total Bilirubin: 0.4 mg/dL (ref 0.3–1.2)
Total Protein: 6.9 g/dL (ref 6.5–8.1)

## 2018-12-29 MED ORDER — HEPARIN SOD (PORK) LOCK FLUSH 100 UNIT/ML IV SOLN
500.0000 [IU] | Freq: Once | INTRAVENOUS | Status: AC | PRN
  Administered 2018-12-29: 11:00:00 500 [IU]
  Filled 2018-12-29: qty 5

## 2018-12-29 MED ORDER — CLONIDINE HCL 0.1 MG PO TABS: 0.2000 mg | ORAL_TABLET | Freq: Once | ORAL | Status: DC

## 2018-12-29 MED ORDER — SODIUM CHLORIDE 0.9 % IV SOLN
Freq: Once | INTRAVENOUS | Status: AC
  Filled 2018-12-29: qty 250

## 2018-12-29 MED ORDER — SODIUM CHLORIDE 0.9% FLUSH
10.0000 mL | INTRAVENOUS | Status: DC | PRN
  Administered 2018-12-29: 08:00:00 10 mL via INTRAVENOUS
  Filled 2018-12-29: qty 10

## 2018-12-29 MED ORDER — SODIUM CHLORIDE 0.9 % IV SOLN
500.0000 mg/m2 | Freq: Once | INTRAVENOUS | Status: AC
  Administered 2018-12-29: 11:00:00 800 mg via INTRAVENOUS
  Filled 2018-12-29: qty 20

## 2018-12-29 MED ORDER — SODIUM CHLORIDE 0.9% FLUSH
10.0000 mL | INTRAVENOUS | Status: DC | PRN
  Administered 2018-12-29: 11:00:00 10 mL
  Filled 2018-12-29: qty 10

## 2018-12-29 MED ORDER — DEXAMETHASONE SODIUM PHOSPHATE 10 MG/ML IJ SOLN
10.0000 mg | Freq: Once | INTRAMUSCULAR | Status: AC
  Administered 2018-12-29: 10:00:00 10 mg via INTRAVENOUS

## 2018-12-29 MED ORDER — ONDANSETRON HCL 8 MG PO TABS
ORAL_TABLET | ORAL | Status: AC
  Filled 2018-12-29: qty 1

## 2018-12-29 MED ORDER — CLONIDINE HCL 0.1 MG PO TABS
0.2000 mg | ORAL_TABLET | Freq: Once | ORAL | Status: AC
  Administered 2018-12-29: 10:00:00 0.2 mg via ORAL

## 2018-12-29 MED ORDER — DEXAMETHASONE SODIUM PHOSPHATE 10 MG/ML IJ SOLN
INTRAMUSCULAR | Status: AC
  Filled 2018-12-29: qty 1

## 2018-12-29 MED ORDER — ONDANSETRON HCL 8 MG PO TABS
8.0000 mg | ORAL_TABLET | Freq: Once | ORAL | Status: AC
  Administered 2018-12-29: 10:00:00 8 mg via ORAL

## 2018-12-29 MED ORDER — CLONIDINE HCL 0.1 MG PO TABS
ORAL_TABLET | ORAL | Status: AC
  Filled 2018-12-29: qty 2

## 2018-12-29 NOTE — Telephone Encounter (Signed)
Appointments already scheduled, patient received after visit summary and calender.

## 2018-12-29 NOTE — Progress Notes (Signed)
[place] [phone]   [phone]  OFFICE PROGRESS NOTE  [name], MD [place] [address]  DIAGNOSIS: Metastatic non-small cell lung cancer initially diagnosed as locally advanced stage IIIB with a right Pancoast tumor involving the vertebral body as well as prominent canal invasion with spinal cord compression in October 2007. The patient also has metastatic disease to the brain in April 2008.  PRIOR THERAPY: 1. Status post concurrent chemoradiation with weekly carboplatin and paclitaxel, last dose was given November 18, 2005. 2. 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 3. Status post gamma knife radiotherapy to a solitary brain lesion located in the superior frontal area of the brain at [facility] in April of 2008. 4. Status post palliative radiotherapy to the lateral abdominal wall metastatic lesion under the care of Dr. Lisbeth Renshaw, completed March of 2009. 5. Status post 6 cycles of systemic chemotherapy with carboplatin and Alimta. Last dose was given July 26, 2007 with disease stabilization. 6. Gamma knife stereotactic radiotherapy to 2 brain lesions one involving the right frontal dural based as well as right parietal lesion performed on 05/07/2012 under the care of Dr. Vallarie Mare at [facility].  CURRENT THERAPY: Maintenance systemic chemotherapy with Alimta 500 MG/M2 every 3 weeks, status post 196 cycles.  INTERVAL HISTORY: Danielle [name] 68 y.o. female returns to the clinic today for follow-up visit.  The patient is feeling fine today with no concerning complaints.  Her blood pressure is high today but she took her blood pressure medication earlier today.  She denied having any current chest pain, shortness of breath, cough or hemoptysis.  She denied having any fever or chills.  She has no nausea, vomiting, diarrhea or  constipation.  She has no headache or visual changes.  She is here today for evaluation before starting cycle #197 of her maintenance treatment.  MEDICAL HISTORY: Past Medical History:  Diagnosis Date  . Anemia   . 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 CVA (cerebral vascular accident) ([facility]) 04/2014   pt states had 2 cva's within 2 wks  . DJD (degenerative joint disease), cervical   . Fibromyalgia   . H/O: pneumonia   . History of tobacco abuse quit 10/08   on nicotine patch  . Hypertension   . Hypokalemia   . Lung cancer ([place]) dx'd 09/2005   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 had chemo and radiation for lung ca  . Thrush 12/11/2010    ALLERGIES:  is allergic to contrast media [iodinated diagnostic agents]; iohexol; sulfa drugs cross reactors; and co-trimoxazole injection [sulfamethoxazole-trimethoprim].  MEDICATIONS:  Current Outpatient Medications  Medication Sig Dispense Refill  . amLODipine (NORVASC) 10 MG tablet TAKE 1 TABLET (10 MG TOTAL) BY MOUTH DAILY. 90 tablet 3  . aspirin 325 MG EC tablet TAKE 1 TABLET BY MOUTH EVERY DAY 90 tablet 0  . atorvastatin (LIPITOR) 40 MG tablet TAKE 1 TABLET BY MOUTH EVERY DAY AT 6 PM 90 tablet 3  . diphenhydrAMINE (BENADRYL) 25 MG tablet Take 1 tablet (25 mg total) by mouth as directed. 2 hours before CT scan. 5 tablet 0  . FeFum-FePoly-FA-B Cmp-C-Biot (INTEGRA PLUS) CAPS TAKE 1 CAPSULE BY MOUTH EVERY DAY IN THE MORNING 90 capsule 3  . fluticasone (FLONASE) 50 MCG/ACT nasal spray Place 1-2 sprays into both nostrils daily as needed. 16 g 5  . folic acid (FOLVITE) 1  MG tablet TAKE 1 TABLET BY MOUTH EVERY DAY 90 tablet 1  . furosemide (LASIX) 20 MG tablet TAKE 1 TABLET BY MOUTH EVERY DAY AS NEEDED FOR SWELLING 20 tablet 1  . lidocaine-prilocaine (EMLA) cream APPLY TO PORT-A-CATH AS DIRECTED 1-2 HOURS PRIOR TO CHEMOTHERAPY 30 g 1  . lisinopril (PRINIVIL,ZESTRIL) 20 MG tablet TAKE 2 TABLETS (40 MG TOTAL) BY MOUTH DAILY. 180  tablet 3  . morphine (MS CONTIN) 60 MG 12 hr tablet Take 1 tablet (60 mg total) by mouth 2 (two) times daily. 60 tablet 0  . morphine (MSIR) 30 MG tablet Take 1 tablet (30 mg total) by mouth every 4 (four) hours as needed (breakthrough pain). 60 tablet 0  . Olopatadine HCl 0.2 % SOLN INSTILL 1 DROP INTO AFFECTED EYE(S) EVERY DAY 2.5 mL 1  . ondansetron (ZOFRAN) 8 MG tablet TAKE 1 TABLET BY MOUTH EVERY 8 HOURS AS NEEDED FOR NAUSEA AND VOMITING 30 tablet 0  . predniSONE (DELTASONE) 50 MG tablet 1 tablet by mouth 13 hours ,7 hours and 1 hour  before CT scan 3 tablet 4  . triamcinolone (KENALOG) 0.025 % ointment Apply 1 application topically 2 (two) times daily. 30 g 0   No current facility-administered medications for this visit.    SURGICAL HISTORY:  Past Surgical History:  Procedure Laterality Date  . CERVICAL LAMINECTOMY  1995  . KNEE SURGERY  1990   Left x 2  . 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 LOOP RECORDER IMPLANT N/A 04/19/2014   Procedure: LOOP RECORDER IMPLANT;  Surgeon: Thompson Grayer, MD;  Location: [facility] CATH LAB;  Service: Cardiovascular;  Laterality: N/A;  . OTHER SURGICAL HISTORY  2008   Gamma knife surgery to remove brain met   . PORTACATH PLACEMENT  -[name]   TIP IN CAVOATRIAL JUNCTION  . TEE WITHOUT CARDIOVERSION N/A 04/19/2014   Procedure: TRANSESOPHAGEAL ECHOCARDIOGRAM (TEE);  Surgeon: Larey Dresser, MD;  Location: [facility];  Service: Cardiovascular;  Laterality: N/A;    REVIEW OF SYSTEMS:  Constitutional: positive for fatigue Eyes: negative Ears, nose, mouth, throat, and face: negative Respiratory: negative Cardiovascular: negative Gastrointestinal: negative Genitourinary:negative Integument/breast: negative Hematologic/lymphatic: negative Musculoskeletal:negative Neurological: negative Behavioral/Psych: negative Endocrine: negative Allergic/Immunologic: negative   PHYSICAL EXAMINATION: General appearance: alert,  cooperative and no distress Head: Normocephalic, without obvious abnormality, atraumatic Neck: no adenopathy, no JVD, supple, symmetrical, trachea midline and thyroid not enlarged, symmetric, no tenderness/mass/nodules Lymph nodes: Cervical, supraclavicular, and axillary nodes normal. Resp: clear to auscultation bilaterally Back: symmetric, no curvature. ROM normal. No CVA tenderness. Cardio: regular rate and rhythm, S1, S2 normal, no murmur, click, rub or gallop GI: soft, non-tender; bowel sounds normal; no masses,  no organomegaly Extremities: extremities normal, atraumatic, no cyanosis or edema Neurologic: Alert and oriented X 3, normal strength and tone. Normal symmetric reflexes. Normal coordination and gait  ECOG PERFORMANCE STATUS: 1 - Symptomatic but completely ambulatory  Blood pressure (!) 183/64, pulse 100, temperature 98.5 F (36.9 C), temperature source Temporal, resp. rate 17, height '5\' 2"'  (1.575 m), weight 155 lb 11.2 oz (70.6 kg), SpO2 96 %.  LABORATORY DATA: Lab Results  Component Value Date   WBC 8.4 12/29/2018   HGB 12.2 12/29/2018   HCT 38.1 12/29/2018   MCV 100.0 12/29/2018   PLT 294 12/29/2018      Chemistry      Component Value Date/Time   NA 141 12/08/2018 0758   NA 142 12/24/2016 0816  K 4.4 12/08/2018 0758   K 4.4 12/24/2016 0816   CL 106 12/08/2018 0758   CL 106 06/30/2012 1004   CO2 26 12/08/2018 0758   CO2 28 12/24/2016 0816   BUN 11 12/08/2018 0758   BUN 15.8 12/24/2016 0816   CREATININE 0.79 12/08/2018 0758   CREATININE 0.87 07/22/2017 0826   CREATININE 1.1 12/24/2016 0816      Component Value Date/Time   CALCIUM 9.5 12/08/2018 0758   CALCIUM 9.6 12/24/2016 0816   ALKPHOS 87 12/08/2018 0758   ALKPHOS 80 12/24/2016 0816   AST 21 12/08/2018 0758   AST 20 07/22/2017 0826   AST 20 12/24/2016 0816   ALT 13 12/08/2018 0758   ALT 11 07/22/2017 0826   ALT 11 12/24/2016 0816   BILITOT 0.5 12/08/2018 0758   BILITOT 0.4 07/22/2017 0826    BILITOT 0.34 12/24/2016 0816       RADIOGRAPHIC STUDIES: No results found.  ASSESSMENT AND PLAN: This is a very pleasant 67 years old white female with metastatic non-small cell lung cancer diagnosed in October 2007 status post concurrent chemoradiation followed by consolidation chemotherapy and she is currently on maintenance treatment with single agent Alimta status post 196 cycles. The patient has been tolerating this treatment well with no concerning adverse effects. I recommended for her to proceed with cycle #197 today as planned. For hypertension I will give the patient a dose of clonidine 0.2 mg orally x1 today and she was advised to take her blood pressure medications and to monitor it closely at home. She will come back for follow-up visit in 3 weeks for evaluation before the next cycle of her treatment. For pain management she will continue with her current pain medication for now. The patient was advised to call immediately if she has any concerning symptoms in the interval. The patient voices understanding of current disease status and treatment options and is in agreement with the current care plan. All questions were answered. The patient knows to call the clinic with any problems, questions or concerns. We can certainly see the patient much sooner if necessary.  Disclaimer: This note was dictated with voice recognition software. Similar sounding words can inadvertently be transcribed and may not be corrected upon review.

## 2018-12-29 NOTE — Patient Instructions (Signed)

## 2018-12-29 NOTE — Patient Instructions (Signed)
[facility] Discharge Instructions for Patients Receiving Chemotherapy  Today you received the following chemotherapy agents: Pemetrexed (Alimta)  To help prevent nausea and vomiting after your treatment, we encourage you to take your nausea medication as directed.    If you develop nausea and vomiting that is not controlled by your nausea medication, call the clinic.   BELOW ARE SYMPTOMS THAT SHOULD BE REPORTED IMMEDIATELY:  *FEVER GREATER THAN 100.5 F  *CHILLS WITH OR WITHOUT FEVER  NAUSEA AND VOMITING THAT IS NOT CONTROLLED WITH YOUR NAUSEA MEDICATION  *UNUSUAL SHORTNESS OF BREATH  *UNUSUAL BRUISING OR BLEEDING  TENDERNESS IN MOUTH AND THROAT WITH OR WITHOUT PRESENCE OF ULCERS  *URINARY PROBLEMS  *BOWEL PROBLEMS  UNUSUAL RASH Items with * indicate a potential emergency and should be followed up as soon as possible.  Feel free to call the clinic should you have any questions or concerns. The clinic phone number is [phone].  Please show the [place] at check-in to the Emergency Department and triage nurse.  Coronavirus (COVID-19) Are you at risk?  Are you at risk for the Coronavirus (COVID-19)?  To be considered HIGH RISK for Coronavirus (COVID-19), you have to meet the following criteria:  . Traveled to [place], [place], [place], [place] or [place]; or in the [place] to [place], [place], [place], or [place]; and have fever, cough, and shortness of breath within the last 2 weeks of travel OR . Been in close contact with a person diagnosed with COVID-19 within the last 2 weeks and have fever, cough, and shortness of breath . IF YOU DO NOT MEET THESE CRITERIA, YOU ARE CONSIDERED LOW RISK FOR COVID-19.  What to do if you are HIGH RISK for COVID-19?  Marland Kitchen If you are having a medical emergency, call 911. . Seek medical care right away. Before you go to a doctor's office, urgent care or emergency department, call ahead and tell them  about your recent travel, contact with someone diagnosed with COVID-19, and your symptoms. You should receive instructions from your physician's office regarding next steps of care.  . When you arrive at healthcare provider, tell the healthcare staff immediately you have returned from visiting [place], [place], [place], [place] or [place]; or traveled in the [place] to [place], [place], [place], or [place]; in the last two weeks or you have been in close contact with a person diagnosed with COVID-19 in the last 2 weeks.   . Tell the health care staff about your symptoms: fever, cough and shortness of breath. . After you have been seen by a medical provider, you will be either: o Tested for (COVID-19) and discharged home on quarantine except to seek medical care if symptoms worsen, and asked to  - Stay home and avoid contact with others until you get your results (4-5 days)  - Avoid travel on public transportation if possible (such as bus, train, or airplane) or o Sent to the Emergency Department by EMS for evaluation, COVID-19 testing, and possible admission depending on your condition and test results.  What to do if you are LOW RISK for COVID-19?  Reduce your risk of any infection by using the same precautions used for avoiding the common cold or flu:  Marland Kitchen Wash your hands often with soap and warm water for at least 20 seconds.  If soap and water are not readily available, use an alcohol-based hand sanitizer with at least 60% alcohol.  . If coughing  or sneezing, cover your mouth and nose by coughing or sneezing into the elbow areas of your shirt or coat, into a tissue or into your sleeve (not your hands). . Avoid shaking hands with others and consider head nods or verbal greetings only. . Avoid touching your eyes, nose, or mouth with unwashed hands.  . Avoid close contact with people who are sick. . Avoid places or events with large numbers of people in one location, like concerts or  sporting events. . Carefully consider travel plans you have or are making. . If you are planning any travel outside or inside the [place], visit the CDC's Travelers' Health webpage for the latest health notices. . If you have some symptoms but not all symptoms, continue to monitor at home and seek medical attention if your symptoms worsen. . If you are having a medical emergency, call 911.   [place] / e-Visit: [email or web address]         [facility]: [facility]: [phone]                   [facility]: [phone]

## 2018-12-30 NOTE — Progress Notes (Signed)
No critical labs need to be addressed urgently. We will discuss labs in detail at upcoming office visit.   

## 2019-01-11 DIAGNOSIS — C44319 Basal cell carcinoma of skin of other parts of face: Secondary | ICD-10-CM | POA: Diagnosis not present

## 2019-01-13 ENCOUNTER — Other Ambulatory Visit: Payer: Self-pay | Admitting: Internal Medicine

## 2019-01-13 DIAGNOSIS — C3410 Malignant neoplasm of upper lobe, unspecified bronchus or lung: Secondary | ICD-10-CM

## 2019-01-13 DIAGNOSIS — C3490 Malignant neoplasm of unspecified part of unspecified bronchus or lung: Secondary | ICD-10-CM

## 2019-01-13 MED ORDER — MORPHINE SULFATE ER 60 MG PO TBCR: 60.0000 mg | EXTENDED_RELEASE_TABLET | Freq: Two times a day (BID) | ORAL | 0 refills | Status: DC

## 2019-01-18 NOTE — Progress Notes (Signed)
[facility] OFFICE PROGRESS NOTE  Danielle Sanders, MD [place] [address]  DIAGNOSIS: Metastatic non-small cell lung cancer initially diagnosed as locally advanced stage IIIB with a right Pancoast tumor involving the vertebral body as well as prominent canal invasion with spinal cord compression in October 2007. The patient also has metastatic disease to the brain in April 2008.  PRIOR THERAPY: 1. Status post concurrent chemoradiation with weekly carboplatin and paclitaxel, last dose was given November 18, 2005. 2. 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 3. Status post gamma knife radiotherapy to a solitary brain lesion located in the superior frontal area of the brain at [facility] in April of 2008. 4. Status post palliative radiotherapy to the lateral abdominal wall metastatic lesion under the care of Dr. Lisbeth Renshaw, completed March of 2009. 5. Status post 6 cycles of systemic chemotherapy with carboplatin and Alimta. Last dose was given July 26, 2007 with disease stabilization. 6. Gamma knife stereotactic radiotherapy to 2 brain lesions one involving the right frontal dural based as well as right parietal lesion performed on 05/07/2012 under the care of Dr. Vallarie Mare at [facility].  CURRENT THERAPY: Maintenance systemic chemotherapy with Alimta 500 MG/M2 every 3 weeks, status post 197cycles.  INTERVAL HISTORY: Danielle [name] 69 y.o. female returns to the clinic for a follow-up visit.  The patient is feeling well today without any concerning complaints.  She is tolerating her treatment with maintenance Alimta well without any adverse side effects except for occasional nausea/vomiting, usually occurring at the end of the week of treatment.  She denies any fever, chills, night sweats, or significant weight loss.  She denies any hemoptysis, cough, chest pain.  She reports  her baseline shortness of breath with exertion.  She denies any diarrhea or constipation.  She denies any headaches or visual changes.  She is here today for evaluation before starting cycle #198.   MEDICAL HISTORY: Past Medical History:  Diagnosis Date  . Anemia   . 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 CVA (cerebral vascular accident) ([place]) 04/2014   pt states had 2 cva's within 2 wks  . DJD (degenerative joint disease), cervical   . Fibromyalgia   . H/O: pneumonia   . History of tobacco abuse quit 10/08   on nicotine patch  . Hypertension   . Hypokalemia   . Lung cancer ([place]) dx'd 09/2005   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 had chemo and radiation for lung ca  . Thrush 12/11/2010    ALLERGIES:  is allergic to contrast media [iodinated diagnostic agents]; iohexol; sulfa drugs cross reactors; and co-trimoxazole injection [sulfamethoxazole-trimethoprim].  MEDICATIONS:  Current Outpatient Medications  Medication Sig Dispense Refill  . amLODipine (NORVASC) 10 MG tablet TAKE 1 TABLET (10 MG TOTAL) BY MOUTH DAILY. 90 tablet 3  . aspirin 325 MG EC tablet TAKE 1 TABLET BY MOUTH EVERY DAY 90 tablet 0  . atorvastatin (LIPITOR) 40 MG tablet TAKE 1 TABLET BY MOUTH EVERY DAY AT 6 PM 90 tablet 3  . diphenhydrAMINE (BENADRYL) 25 MG tablet Take 1 tablet (25 mg total) by mouth as directed. 2 hours before CT scan. 5 tablet 0  . FeFum-FePoly-FA-B Cmp-C-Biot (INTEGRA PLUS) CAPS TAKE 1 CAPSULE BY MOUTH EVERY DAY IN THE MORNING 90 capsule 3  . fluticasone (FLONASE) 50 MCG/ACT nasal spray Place 1-2 sprays into both nostrils daily as needed. 16 g 5  .  folic acid (FOLVITE) 1 MG tablet TAKE 1 TABLET BY MOUTH EVERY DAY 90 tablet 1  . furosemide (LASIX) 20 MG tablet TAKE 1 TABLET BY MOUTH EVERY DAY AS NEEDED FOR SWELLING 20 tablet 1  . lidocaine-prilocaine (EMLA) cream APPLY TO PORT-A-CATH AS DIRECTED 1-2 HOURS PRIOR TO CHEMOTHERAPY 30 g 1  . lisinopril (PRINIVIL,ZESTRIL) 20 MG  tablet TAKE 2 TABLETS (40 MG TOTAL) BY MOUTH DAILY. 180 tablet 3  . morphine (MS CONTIN) 60 MG 12 hr tablet Take 1 tablet (60 mg total) by mouth 2 (two) times daily. 60 tablet 0  . morphine (MSIR) 30 MG tablet Take 1 tablet (30 mg total) by mouth every 4 (four) hours as needed (breakthrough pain). 60 tablet 0  . Olopatadine HCl 0.2 % SOLN INSTILL 1 DROP INTO AFFECTED EYE(S) EVERY DAY 2.5 mL 1  . ondansetron (ZOFRAN) 8 MG tablet TAKE 1 TABLET BY MOUTH EVERY 8 HOURS AS NEEDED FOR NAUSEA AND VOMITING 30 tablet 0  . predniSONE (DELTASONE) 50 MG tablet 1 tablet by mouth 13 hours ,7 hours and 1 hour  before CT scan 3 tablet 4  . triamcinolone (KENALOG) 0.025 % ointment Apply 1 application topically 2 (two) times daily. 30 g 0   No current facility-administered medications for this visit.    SURGICAL HISTORY:  Past Surgical History:  Procedure Laterality Date  . CERVICAL LAMINECTOMY  1995  . KNEE SURGERY  1990   Left x 2  . 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 LOOP RECORDER IMPLANT N/A 04/19/2014   Procedure: LOOP RECORDER IMPLANT;  Surgeon: Thompson Grayer, MD;  Location: [facility] CATH LAB;  Service: Cardiovascular;  Laterality: N/A;  . OTHER SURGICAL HISTORY  2008   Gamma knife surgery to remove brain met   . PORTACATH PLACEMENT  -[name]   TIP IN CAVOATRIAL JUNCTION  . TEE WITHOUT CARDIOVERSION N/A 04/19/2014   Procedure: TRANSESOPHAGEAL ECHOCARDIOGRAM (TEE);  Surgeon: Larey Dresser, MD;  Location: [facility];  Service: Cardiovascular;  Laterality: N/A;    REVIEW OF SYSTEMS:   Constitutional: Negative for appetite change, chills, fatigue, fever and unexpected weight change.  HENT: Positive for nasal congestion. Negative for mouth sores, nosebleeds, sore throat and trouble swallowing.   Eyes: Negative for eye problems and icterus.  Respiratory: Positive for baseline shortness of breath with exertion and baseline cough. Negative for cough, hemoptysis,  and  wheezing.   Cardiovascular: Negative for chest pain and leg swelling.  Gastrointestinal: Positive for occasional nausea and vomiting. Negative for abdominal pain, constipation, and diarrhea. Genitourinary: Negative for bladder incontinence, difficulty urinating, dysuria, frequency and hematuria.   Musculoskeletal: Negative for back pain, gait problem, neck pain and neck stiffness.  Skin: Positive for several scars on her skin. Negative for itching and rash.  Neurological: Negative for dizziness, extremity weakness, gait problem, headaches, light-headedness and seizures.  Hematological: Negative for adenopathy. Does not bruise/bleed easily.  Psychiatric/Behavioral: Negative for confusion, depression and sleep disturbance. The patient is not nervous/anxious.    PHYSICAL EXAMINATION:  There were no vitals taken for this visit.  ECOG PERFORMANCE STATUS: 1 - Symptomatic but completely ambulatory  Physical Exam  Constitutional: Oriented to person, place, and time and well-developed, well-nourished, and in no distress.  HENT:  Head: Normocephalic and atraumatic.  Mouth/Throat: Oropharynx is clear and moist. No oropharyngeal exudate.  Eyes: Conjunctivae are normal. Right eye exhibits no discharge. Left eye exhibits no discharge. No scleral icterus.  Neck: Normal range  of motion. Neck supple.  Cardiovascular: Normal rate, regular rhythm, normal heart sounds and intact distal pulses.   Pulmonary/Chest: Effort normal and breath sounds normal. No respiratory distress. No wheezes. No rales.  Abdominal: Soft. Bowel sounds are normal. Exhibits no distension and no mass. There is no tenderness.  Musculoskeletal: Normal range of motion. Exhibits no edema.  Lymphadenopathy:    No cervical adenopathy.  Neurological: Mild drooping of right eye from prior hx of brain tumor.Alert and oriented to person, place, and time. Exhibits normal muscle tone. Gait normal. Coordination normal.  Skin: Skin is warm and  dry. No rash noted. Not diaphoretic. No erythema. No pallor.  Psychiatric: Mood, memory and judgment normal.  Vitals reviewed.  LABORATORY DATA: Lab Results  Component Value Date   WBC 8.4 12/29/2018   HGB 12.2 12/29/2018   HCT 38.1 12/29/2018   MCV 100.0 12/29/2018   PLT 294 12/29/2018      Chemistry      Component Value Date/Time   NA 143 12/29/2018 0825   NA 142 12/24/2016 0816   K 4.2 12/29/2018 0825   K 4.4 12/24/2016 0816   CL 108 12/29/2018 0825   CL 106 06/30/2012 1004   CO2 25 12/29/2018 0825   CO2 28 12/24/2016 0816   BUN 12 12/29/2018 0825   BUN 15.8 12/24/2016 0816   CREATININE 0.75 12/29/2018 0825   CREATININE 0.87 07/22/2017 0826   CREATININE 1.1 12/24/2016 0816      Component Value Date/Time   CALCIUM 9.1 12/29/2018 0825   CALCIUM 9.6 12/24/2016 0816   ALKPHOS 84 12/29/2018 0825   ALKPHOS 80 12/24/2016 0816   AST 23 12/29/2018 0825   AST 20 07/22/2017 0826   AST 20 12/24/2016 0816   ALT 13 12/29/2018 0825   ALT 11 07/22/2017 0826   ALT 11 12/24/2016 0816   BILITOT 0.4 12/29/2018 0825   BILITOT 0.4 07/22/2017 0826   BILITOT 0.34 12/24/2016 0816       RADIOGRAPHIC STUDIES:  No results found.   ASSESSMENT/PLAN:  This isa very pleasant 69 year old Caucasian female with metastatic non-small cell lung cancer who was initially diagnosed in October 2007. She is status post current chemoradiation followed by consolidation chemotherapy.  She is currently undergoing weekly maintainance chemotherapy with single agent Alimta 500 mg/m. She is status post 197 cycles. She is tolerating treatment well without any concerning complaints.   Labs were reviewed.  We recommend that she proceed with cycle #198 today scheduled.   We will see the patient back for follow-up visit in 3 weeks for evaluation and to starting cycle #199.  I sent a refill of Zofran to the patient's pharmacy.  The patient elevated blood pressure today, the patient states that she  took her antihypertensives this morning as prescribed.  She states that she took her blood pressure at home and it was within normal limits.  Repeat BP was 144/57.  The patient was advised to call immediately if she has any concerning symptoms in the interval. The patient voices understanding of current disease status and treatment options and is in agreement with the current care plan. All questions were answered. The patient knows to call the clinic with any problems, questions or concerns. We can certainly see the patient much sooner if necessary    No orders of the defined types were placed in this encounter.    Danielle [name] L Yovanni Frenette, PA-C 01/18/19

## 2019-01-19 ENCOUNTER — Inpatient Hospital Stay: Payer: Medicare Other

## 2019-01-19 ENCOUNTER — Other Ambulatory Visit: Payer: Self-pay

## 2019-01-19 ENCOUNTER — Inpatient Hospital Stay: Payer: Medicare Other | Attending: Internal Medicine | Admitting: Internal Medicine

## 2019-01-19 ENCOUNTER — Inpatient Hospital Stay (HOSPITAL_BASED_OUTPATIENT_CLINIC_OR_DEPARTMENT_OTHER): Payer: Medicare Other | Admitting: Physician Assistant

## 2019-01-19 VITALS — BP 169/58 | HR 85 | Temp 98.7°F | Resp 16 | Ht 62.0 in | Wt 152.9 lb

## 2019-01-19 VITALS — BP 144/57 | HR 71

## 2019-01-19 DIAGNOSIS — Z95828 Presence of other vascular implants and grafts: Secondary | ICD-10-CM

## 2019-01-19 DIAGNOSIS — I10 Essential (primary) hypertension: Secondary | ICD-10-CM | POA: Insufficient documentation

## 2019-01-19 DIAGNOSIS — Z8673 Personal history of transient ischemic attack (TIA), and cerebral infarction without residual deficits: Secondary | ICD-10-CM | POA: Insufficient documentation

## 2019-01-19 DIAGNOSIS — C3490 Malignant neoplasm of unspecified part of unspecified bronchus or lung: Secondary | ICD-10-CM

## 2019-01-19 DIAGNOSIS — C7931 Secondary malignant neoplasm of brain: Secondary | ICD-10-CM | POA: Diagnosis not present

## 2019-01-19 DIAGNOSIS — Z7982 Long term (current) use of aspirin: Secondary | ICD-10-CM | POA: Insufficient documentation

## 2019-01-19 DIAGNOSIS — M47892 Other spondylosis, cervical region: Secondary | ICD-10-CM | POA: Insufficient documentation

## 2019-01-19 DIAGNOSIS — C3411 Malignant neoplasm of upper lobe, right bronchus or lung: Secondary | ICD-10-CM

## 2019-01-19 DIAGNOSIS — M797 Fibromyalgia: Secondary | ICD-10-CM | POA: Insufficient documentation

## 2019-01-19 DIAGNOSIS — Z5111 Encounter for antineoplastic chemotherapy: Secondary | ICD-10-CM | POA: Insufficient documentation

## 2019-01-19 DIAGNOSIS — C7989 Secondary malignant neoplasm of other specified sites: Secondary | ICD-10-CM | POA: Insufficient documentation

## 2019-01-19 DIAGNOSIS — Z79899 Other long term (current) drug therapy: Secondary | ICD-10-CM | POA: Diagnosis not present

## 2019-01-19 DIAGNOSIS — Z923 Personal history of irradiation: Secondary | ICD-10-CM | POA: Insufficient documentation

## 2019-01-19 LAB — COMPREHENSIVE METABOLIC PANEL
ALT: 14 U/L (ref 0–44)
AST: 24 U/L (ref 15–41)
Albumin: 3.6 g/dL (ref 3.5–5.0)
Alkaline Phosphatase: 83 U/L (ref 38–126)
Anion gap: 13 (ref 5–15)
BUN: 11 mg/dL (ref 8–23)
CO2: 21 mmol/L — ABNORMAL LOW (ref 22–32)
Calcium: 9 mg/dL (ref 8.9–10.3)
Chloride: 108 mmol/L (ref 98–111)
Creatinine, Ser: 0.76 mg/dL (ref 0.44–1.00)
GFR calc Af Amer: >60 mL/min (ref >60)
GFR calc non Af Amer: >60 mL/min (ref >60)
Glucose, Bld: 145 mg/dL — ABNORMAL HIGH (ref 70–99)
Potassium: 3.8 mmol/L (ref 3.5–5.1)
Sodium: 142 mmol/L (ref 135–145)
Total Bilirubin: 0.5 mg/dL (ref 0.3–1.2)
Total Protein: 7 g/dL (ref 6.5–8.1)

## 2019-01-19 LAB — CBC WITH DIFFERENTIAL/PLATELET
Abs Immature Granulocytes: 0.04 10*3/uL (ref 0.00–0.07)
Basophils Absolute: 0.1 10*3/uL (ref 0.0–0.1)
Basophils Relative: 1 %
Eosinophils Absolute: 0.1 10*3/uL (ref 0.0–0.5)
Eosinophils Relative: 1 %
HCT: 39.1 % (ref 36.0–46.0)
Hemoglobin: 12.8 g/dL (ref 12.0–15.0)
Immature Granulocytes: 0 %
Lymphocytes Relative: 25 %
Lymphs Abs: 2.7 10*3/uL (ref 0.7–4.0)
MCH: 31.7 pg (ref 26.0–34.0)
MCHC: 32.7 g/dL (ref 30.0–36.0)
MCV: 96.8 fL (ref 80.0–100.0)
Monocytes Absolute: 1.2 10*3/uL — ABNORMAL HIGH (ref 0.1–1.0)
Monocytes Relative: 11 %
Neutro Abs: 7 10*3/uL (ref 1.7–7.7)
Neutrophils Relative %: 62 %
Platelets: 395 10*3/uL (ref 150–400)
RBC: 4.04 MIL/uL (ref 3.87–5.11)
RDW: 13.2 % (ref 11.5–15.5)
WBC: 11.1 10*3/uL — ABNORMAL HIGH (ref 4.0–10.5)
nRBC: 0 % (ref 0.0–0.2)

## 2019-01-19 MED ORDER — DEXAMETHASONE SODIUM PHOSPHATE 10 MG/ML IJ SOLN
INTRAMUSCULAR | Status: AC
  Filled 2019-01-19: qty 1

## 2019-01-19 MED ORDER — DEXAMETHASONE SODIUM PHOSPHATE 10 MG/ML IJ SOLN
10.0000 mg | Freq: Once | INTRAMUSCULAR | Status: AC
  Administered 2019-01-19: 10 mg via INTRAVENOUS

## 2019-01-19 MED ORDER — CYANOCOBALAMIN 1000 MCG/ML IJ SOLN
1000.0000 ug | Freq: Once | INTRAMUSCULAR | Status: AC
  Administered 2019-01-19: 1000 ug via INTRAMUSCULAR
  Filled 2019-01-19: qty 1

## 2019-01-19 MED ORDER — SODIUM CHLORIDE 0.9 % IV SOLN
500.0000 mg/m2 | Freq: Once | INTRAVENOUS | Status: AC
  Administered 2019-01-19: 800 mg via INTRAVENOUS
  Filled 2019-01-19: qty 20

## 2019-01-19 MED ORDER — ONDANSETRON HCL 8 MG PO TABS
ORAL_TABLET | ORAL | Status: AC
  Filled 2019-01-19: qty 1

## 2019-01-19 MED ORDER — SODIUM CHLORIDE 0.9% FLUSH
10.0000 mL | INTRAVENOUS | Status: DC | PRN
  Administered 2019-01-19: 10 mL via INTRAVENOUS
  Filled 2019-01-19: qty 10

## 2019-01-19 MED ORDER — SODIUM CHLORIDE 0.9% FLUSH
10.0000 mL | INTRAVENOUS | Status: DC | PRN
  Administered 2019-01-19: 10 mL
  Filled 2019-01-19: qty 10

## 2019-01-19 MED ORDER — ONDANSETRON HCL 8 MG PO TABS: ORAL_TABLET | ORAL | 2 refills | Status: DC

## 2019-01-19 MED ORDER — ONDANSETRON HCL 8 MG PO TABS
8.0000 mg | ORAL_TABLET | Freq: Once | ORAL | Status: AC
  Administered 2019-01-19: 8 mg via ORAL

## 2019-01-19 MED ORDER — SODIUM CHLORIDE 0.9 % IV SOLN
Freq: Once | INTRAVENOUS | Status: AC
  Filled 2019-01-19: qty 250

## 2019-01-19 MED ORDER — HEPARIN SOD (PORK) LOCK FLUSH 100 UNIT/ML IV SOLN
500.0000 [IU] | Freq: Once | INTRAVENOUS | Status: AC | PRN
  Administered 2019-01-19: 500 [IU]
  Filled 2019-01-19: qty 5

## 2019-01-19 NOTE — Patient Instructions (Signed)
[facility] Discharge Instructions for Patients Receiving Chemotherapy  Today you received the following chemotherapy agents: Pemetrexed (Alimta)  To help prevent nausea and vomiting after your treatment, we encourage you to take your nausea medication as directed.    If you develop nausea and vomiting that is not controlled by your nausea medication, call the clinic.   BELOW ARE SYMPTOMS THAT SHOULD BE REPORTED IMMEDIATELY:  *FEVER GREATER THAN 100.5 F  *CHILLS WITH OR WITHOUT FEVER  NAUSEA AND VOMITING THAT IS NOT CONTROLLED WITH YOUR NAUSEA MEDICATION  *UNUSUAL SHORTNESS OF BREATH  *UNUSUAL BRUISING OR BLEEDING  TENDERNESS IN MOUTH AND THROAT WITH OR WITHOUT PRESENCE OF ULCERS  *URINARY PROBLEMS  *BOWEL PROBLEMS  UNUSUAL RASH Items with * indicate a potential emergency and should be followed up as soon as possible.  Feel free to call the clinic should you have any questions or concerns. The clinic phone number is [phone].  Please show the [facility] at check-in to the Emergency Department and triage nurse.

## 2019-01-19 NOTE — Progress Notes (Signed)
No critical labs need to be addressed urgently. We will discuss labs in detail at upcoming office visit.   

## 2019-01-22 DIAGNOSIS — C4491 Basal cell carcinoma of skin, unspecified: Secondary | ICD-10-CM

## 2019-01-22 HISTORY — DX: Basal cell carcinoma of skin, unspecified: C44.91

## 2019-01-25 DIAGNOSIS — C44319 Basal cell carcinoma of skin of other parts of face: Secondary | ICD-10-CM | POA: Diagnosis not present

## 2019-02-08 DIAGNOSIS — C7931 Secondary malignant neoplasm of brain: Secondary | ICD-10-CM | POA: Diagnosis not present

## 2019-02-08 DIAGNOSIS — Z85118 Personal history of other malignant neoplasm of bronchus and lung: Secondary | ICD-10-CM | POA: Diagnosis not present

## 2019-02-09 ENCOUNTER — Inpatient Hospital Stay: Payer: Medicare Other

## 2019-02-09 ENCOUNTER — Inpatient Hospital Stay (HOSPITAL_BASED_OUTPATIENT_CLINIC_OR_DEPARTMENT_OTHER): Payer: Medicare Other | Admitting: Internal Medicine

## 2019-02-09 ENCOUNTER — Encounter: Payer: Self-pay | Admitting: Internal Medicine

## 2019-02-09 ENCOUNTER — Other Ambulatory Visit: Payer: Self-pay | Admitting: Internal Medicine

## 2019-02-09 ENCOUNTER — Encounter: Payer: Self-pay | Admitting: Medical Oncology

## 2019-02-09 ENCOUNTER — Inpatient Hospital Stay: Payer: Medicare Other | Attending: Internal Medicine | Admitting: Internal Medicine

## 2019-02-09 ENCOUNTER — Other Ambulatory Visit: Payer: Self-pay

## 2019-02-09 VITALS — BP 137/61 | HR 79 | Temp 98.7°F | Resp 18 | Ht 62.0 in | Wt 150.2 lb

## 2019-02-09 DIAGNOSIS — Z79899 Other long term (current) drug therapy: Secondary | ICD-10-CM | POA: Diagnosis not present

## 2019-02-09 DIAGNOSIS — R112 Nausea with vomiting, unspecified: Secondary | ICD-10-CM | POA: Diagnosis not present

## 2019-02-09 DIAGNOSIS — C7931 Secondary malignant neoplasm of brain: Secondary | ICD-10-CM

## 2019-02-09 DIAGNOSIS — M47812 Spondylosis without myelopathy or radiculopathy, cervical region: Secondary | ICD-10-CM | POA: Insufficient documentation

## 2019-02-09 DIAGNOSIS — G893 Neoplasm related pain (acute) (chronic): Secondary | ICD-10-CM | POA: Diagnosis not present

## 2019-02-09 DIAGNOSIS — C7989 Secondary malignant neoplasm of other specified sites: Secondary | ICD-10-CM | POA: Insufficient documentation

## 2019-02-09 DIAGNOSIS — Z8673 Personal history of transient ischemic attack (TIA), and cerebral infarction without residual deficits: Secondary | ICD-10-CM | POA: Insufficient documentation

## 2019-02-09 DIAGNOSIS — C3411 Malignant neoplasm of upper lobe, right bronchus or lung: Secondary | ICD-10-CM | POA: Insufficient documentation

## 2019-02-09 DIAGNOSIS — Z5111 Encounter for antineoplastic chemotherapy: Secondary | ICD-10-CM

## 2019-02-09 DIAGNOSIS — M797 Fibromyalgia: Secondary | ICD-10-CM | POA: Diagnosis not present

## 2019-02-09 DIAGNOSIS — C3490 Malignant neoplasm of unspecified part of unspecified bronchus or lung: Secondary | ICD-10-CM | POA: Diagnosis not present

## 2019-02-09 DIAGNOSIS — Z7982 Long term (current) use of aspirin: Secondary | ICD-10-CM | POA: Diagnosis not present

## 2019-02-09 DIAGNOSIS — I10 Essential (primary) hypertension: Secondary | ICD-10-CM | POA: Insufficient documentation

## 2019-02-09 DIAGNOSIS — Z95828 Presence of other vascular implants and grafts: Secondary | ICD-10-CM

## 2019-02-09 DIAGNOSIS — Z923 Personal history of irradiation: Secondary | ICD-10-CM | POA: Insufficient documentation

## 2019-02-09 LAB — COMPREHENSIVE METABOLIC PANEL
ALT: 11 U/L (ref 0–44)
AST: 18 U/L (ref 15–41)
Albumin: 3.5 g/dL (ref 3.5–5.0)
Alkaline Phosphatase: 74 U/L (ref 38–126)
Anion gap: 10 (ref 5–15)
BUN: 23 mg/dL (ref 8–23)
CO2: 29 mmol/L (ref 22–32)
Calcium: 9 mg/dL (ref 8.9–10.3)
Chloride: 101 mmol/L (ref 98–111)
Creatinine, Ser: 1.43 mg/dL — ABNORMAL HIGH (ref 0.44–1.00)
GFR calc Af Amer: 44 mL/min — ABNORMAL LOW (ref >60)
GFR calc non Af Amer: 38 mL/min — ABNORMAL LOW (ref >60)
Glucose, Bld: 184 mg/dL — ABNORMAL HIGH (ref 70–99)
Potassium: 3.7 mmol/L (ref 3.5–5.1)
Sodium: 140 mmol/L (ref 135–145)
Total Bilirubin: 0.5 mg/dL (ref 0.3–1.2)
Total Protein: 6.4 g/dL — ABNORMAL LOW (ref 6.5–8.1)

## 2019-02-09 LAB — CBC WITH DIFFERENTIAL/PLATELET
Abs Immature Granulocytes: 0.04 10*3/uL (ref 0.00–0.07)
Basophils Absolute: 0.1 10*3/uL (ref 0.0–0.1)
Basophils Relative: 0 %
Eosinophils Absolute: 0 10*3/uL (ref 0.0–0.5)
Eosinophils Relative: 0 %
HCT: 38.5 % (ref 36.0–46.0)
Hemoglobin: 12.4 g/dL (ref 12.0–15.0)
Immature Granulocytes: 0 %
Lymphocytes Relative: 15 %
Lymphs Abs: 1.9 10*3/uL (ref 0.7–4.0)
MCH: 31.6 pg (ref 26.0–34.0)
MCHC: 32.2 g/dL (ref 30.0–36.0)
MCV: 98.2 fL (ref 80.0–100.0)
Monocytes Absolute: 1.4 10*3/uL — ABNORMAL HIGH (ref 0.1–1.0)
Monocytes Relative: 11 %
Neutro Abs: 9.1 10*3/uL — ABNORMAL HIGH (ref 1.7–7.7)
Neutrophils Relative %: 74 %
Platelets: 374 10*3/uL (ref 150–400)
RBC: 3.92 MIL/uL (ref 3.87–5.11)
RDW: 13.7 % (ref 11.5–15.5)
WBC: 12.4 10*3/uL — ABNORMAL HIGH (ref 4.0–10.5)
nRBC: 0 % (ref 0.0–0.2)

## 2019-02-09 MED ORDER — ONDANSETRON HCL 8 MG PO TABS
8.0000 mg | ORAL_TABLET | Freq: Once | ORAL | Status: AC
  Administered 2019-02-09: 8 mg via ORAL

## 2019-02-09 MED ORDER — HEPARIN SOD (PORK) LOCK FLUSH 100 UNIT/ML IV SOLN
500.0000 [IU] | Freq: Once | INTRAVENOUS | Status: AC | PRN
  Administered 2019-02-09: 16:00:00 500 [IU]
  Filled 2019-02-09: qty 5

## 2019-02-09 MED ORDER — SODIUM CHLORIDE 0.9% FLUSH
10.0000 mL | INTRAVENOUS | Status: DC | PRN
  Administered 2019-02-09: 13:00:00 10 mL via INTRAVENOUS
  Filled 2019-02-09: qty 10

## 2019-02-09 MED ORDER — SODIUM CHLORIDE 0.9% FLUSH
10.0000 mL | INTRAVENOUS | Status: DC | PRN
  Administered 2019-02-09: 16:00:00 10 mL
  Filled 2019-02-09: qty 10

## 2019-02-09 MED ORDER — DEXAMETHASONE SODIUM PHOSPHATE 10 MG/ML IJ SOLN
10.0000 mg | Freq: Once | INTRAMUSCULAR | Status: AC
  Administered 2019-02-09: 14:00:00 10 mg via INTRAVENOUS

## 2019-02-09 MED ORDER — SODIUM CHLORIDE 0.9 % IV SOLN
Freq: Once | INTRAVENOUS | Status: AC
  Filled 2019-02-09: qty 250

## 2019-02-09 MED ORDER — SODIUM CHLORIDE 0.9 % IV SOLN
500.0000 mg/m2 | Freq: Once | INTRAVENOUS | Status: AC
  Administered 2019-02-09: 800 mg via INTRAVENOUS
  Filled 2019-02-09: qty 12

## 2019-02-09 MED ORDER — ONDANSETRON HCL 8 MG PO TABS
ORAL_TABLET | ORAL | Status: AC
  Filled 2019-02-09: qty 1

## 2019-02-09 MED ORDER — DEXAMETHASONE SODIUM PHOSPHATE 10 MG/ML IJ SOLN
INTRAMUSCULAR | Status: AC
  Filled 2019-02-09: qty 1

## 2019-02-09 NOTE — Progress Notes (Signed)
No critical labs need to be addressed urgently. We will discuss labs in detail at upcoming office visit.   

## 2019-02-09 NOTE — Patient Instructions (Signed)
[facility] Discharge Instructions for Patients Receiving Chemotherapy  Today you received the following chemotherapy agents: Pemetrexed (Alimta)  To help prevent nausea and vomiting after your treatment, we encourage you to take your nausea medication as directed.    If you develop nausea and vomiting that is not controlled by your nausea medication, call the clinic.   BELOW ARE SYMPTOMS THAT SHOULD BE REPORTED IMMEDIATELY:  *FEVER GREATER THAN 100.5 F  *CHILLS WITH OR WITHOUT FEVER  NAUSEA AND VOMITING THAT IS NOT CONTROLLED WITH YOUR NAUSEA MEDICATION  *UNUSUAL SHORTNESS OF BREATH  *UNUSUAL BRUISING OR BLEEDING  TENDERNESS IN MOUTH AND THROAT WITH OR WITHOUT PRESENCE OF ULCERS  *URINARY PROBLEMS  *BOWEL PROBLEMS  UNUSUAL RASH Items with * indicate a potential emergency and should be followed up as soon as possible.  Feel free to call the clinic should you have any questions or concerns. The clinic phone number is [phone].  Please show the [facility] at check-in to the Emergency Department and triage nurse.

## 2019-02-09 NOTE — Progress Notes (Signed)
Danielle [name]:([phone]   [phone]  OFFICE PROGRESS NOTE  [name], MD [place] [address]  DIAGNOSIS: Metastatic non-small cell lung cancer initially diagnosed as locally advanced stage IIIB with a right Pancoast tumor involving the vertebral body as well as prominent canal invasion with spinal cord compression in October 2007. The patient also has metastatic disease to the brain in April 2008.  PRIOR THERAPY: 1. Status post concurrent chemoradiation with weekly carboplatin and paclitaxel, last dose was given November 18, 2005. 2. 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 3. Status post gamma knife radiotherapy to a solitary brain lesion located in the superior frontal area of the brain at [facility] in April of 2008. 4. Status post palliative radiotherapy to the lateral abdominal wall metastatic lesion under the care of Dr. Lisbeth Renshaw, completed March of 2009. 5. Status post 6 cycles of systemic chemotherapy with carboplatin and Alimta. Last dose was given July 26, 2007 with disease stabilization. 6. Gamma knife stereotactic radiotherapy to 2 brain lesions one involving the right frontal dural based as well as right parietal lesion performed on 05/07/2012 under the care of Dr. Vallarie Mare at [facility].  CURRENT THERAPY: Maintenance systemic chemotherapy with Alimta 500 MG/M2 every 3 weeks, status post 198 cycles.  INTERVAL HISTORY: Danielle [name] 69 y.o. female returns to the clinic today for follow-up visit.  The patient is feeling fine today with no concerning complaints.  She had a recent skin surgery on the forehead.  She was also seen by her radiation oncologist at [facility] and her MRI of the brain was unremarkable for any disease recurrence in the brain.  She denied having any chest pain, shortness of breath, cough or  hemoptysis.  She denied having any fever or chills.  She has no nausea, vomiting, diarrhea or constipation.  She denied having any headache or visual changes.  She is here today for evaluation before starting cycle #199.   MEDICAL HISTORY: Past Medical History:  Diagnosis Date  . Anemia   . 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 CVA (cerebral vascular accident) ([place]) 04/2014   pt states had 2 cva's within 2 wks  . DJD (degenerative joint disease), cervical   . Fibromyalgia   . H/O: pneumonia   . History of tobacco abuse quit 10/08   on nicotine patch  . Hypertension   . Hypokalemia   . Lung cancer ([place]) dx'd 09/2005   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 had chemo and radiation for lung ca  . Thrush 12/11/2010    ALLERGIES:  is allergic to contrast media [iodinated diagnostic agents]; iohexol; sulfa drugs cross reactors; and co-trimoxazole injection [sulfamethoxazole-trimethoprim].  MEDICATIONS:  Current Outpatient Medications  Medication Sig Dispense Refill  . amLODipine (NORVASC) 10 MG tablet TAKE 1 TABLET (10 MG TOTAL) BY MOUTH DAILY. 90 tablet 3  . aspirin 325 MG EC tablet TAKE 1 TABLET BY MOUTH EVERY DAY 90 tablet 0  . atorvastatin (LIPITOR) 40 MG tablet TAKE 1 TABLET BY MOUTH EVERY DAY AT 6 PM 90 tablet 3  . diphenhydrAMINE (BENADRYL) 25 MG tablet Take 1 tablet (25 mg total) by mouth as directed. 2 hours before CT scan. 5 tablet 0  . FeFum-FePoly-FA-B Cmp-C-Biot (INTEGRA PLUS) CAPS TAKE 1 CAPSULE BY MOUTH EVERY DAY IN THE MORNING 90 capsule 3  . fluticasone (FLONASE) 50 MCG/ACT nasal  spray Place 1-2 sprays into both nostrils daily as needed. 16 g 5  . folic acid (FOLVITE) 1 MG tablet TAKE 1 TABLET BY MOUTH EVERY DAY 90 tablet 1  . furosemide (LASIX) 20 MG tablet TAKE 1 TABLET BY MOUTH EVERY DAY AS NEEDED FOR SWELLING 20 tablet 1  . lidocaine-prilocaine (EMLA) cream APPLY TO PORT-A-CATH AS DIRECTED 1-2 HOURS PRIOR TO CHEMOTHERAPY 30 g 1  . lisinopril  (PRINIVIL,ZESTRIL) 20 MG tablet TAKE 2 TABLETS (40 MG TOTAL) BY MOUTH DAILY. 180 tablet 3  . morphine (MS CONTIN) 60 MG 12 hr tablet Take 1 tablet (60 mg total) by mouth 2 (two) times daily. 60 tablet 0  . morphine (MSIR) 30 MG tablet Take 1 tablet (30 mg total) by mouth every 4 (four) hours as needed (breakthrough pain). 60 tablet 0  . Olopatadine HCl 0.2 % SOLN INSTILL 1 DROP INTO AFFECTED EYE(S) EVERY DAY 2.5 mL 1  . ondansetron (ZOFRAN) 8 MG tablet TAKE 1 TABLET BY MOUTH EVERY 8 HOURS AS NEEDED FOR NAUSEA AND VOMITING 30 tablet 2  . predniSONE (DELTASONE) 50 MG tablet 1 tablet by mouth 13 hours ,7 hours and 1 hour  before CT scan 3 tablet 4  . triamcinolone (KENALOG) 0.025 % ointment Apply 1 application topically 2 (two) times daily. 30 g 0   No current facility-administered medications for this visit.   Facility-Administered Medications Ordered in Other Visits  Medication Dose Route Frequency Provider Last Rate Last Admin  . sodium chloride flush (NS) 0.9 % injection 10 mL  10 mL Intravenous PRN Mohamed, Mohamed, MD   10 mL at 02/09/19 1238    SURGICAL HISTORY:  Past Surgical History:  Procedure Laterality Date  . CERVICAL LAMINECTOMY  1995  . KNEE SURGERY  1990   Left x 2  . 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 LOOP RECORDER IMPLANT N/A 04/19/2014   Procedure: LOOP RECORDER IMPLANT;  Surgeon: James Allred, MD;  Location: [facility] CATH LAB;  Service: Cardiovascular;  Laterality: N/A;  . OTHER SURGICAL HISTORY  2008   Gamma knife surgery to remove brain met   . PORTACATH PLACEMENT  -[name]   TIP IN CAVOATRIAL JUNCTION  . TEE WITHOUT CARDIOVERSION N/A 04/19/2014   Procedure: TRANSESOPHAGEAL ECHOCARDIOGRAM (TEE);  Surgeon: Dalton S McLean, MD;  Location: [facility] ENDOSCOPY;  Service: Cardiovascular;  Laterality: N/A;    REVIEW OF SYSTEMS:  A comprehensive review of systems was negative except for: Constitutional: positive for fatigue   PHYSICAL EXAMINATION:  General appearance: alert, cooperative and no distress Head: Normocephalic, without obvious abnormality, atraumatic Neck: no adenopathy, no JVD, supple, symmetrical, trachea midline and thyroid not enlarged, symmetric, no tenderness/mass/nodules Lymph nodes: Cervical, supraclavicular, and axillary nodes normal. Resp: clear to auscultation bilaterally Back: symmetric, no curvature. ROM normal. No CVA tenderness. Cardio: regular rate and rhythm, S1, S2 normal, no murmur, click, rub or gallop GI: soft, non-tender; bowel sounds normal; no masses,  no organomegaly Extremities: extremities normal, atraumatic, no cyanosis or edema  ECOG PERFORMANCE STATUS: 1 - Symptomatic but completely ambulatory  Blood pressure 137/61, pulse 79, temperature 98.7 F (37.1 C), temperature source Temporal, resp. rate 18, height 5' 2" (1.575 m), weight 150 lb 3.2 oz (68.1 kg), SpO2 91 %.  LABORATORY DATA: Lab Results  Component Value Date   WBC 12.4 (H) 02/09/2019   HGB 12.4 02/09/2019   HCT 38.5 02/09/2019   MCV 98.2 02/09/2019   PLT 374 02/09/2019        Chemistry      Component Value Date/Time   NA 142 01/19/2019 0747   NA 142 12/24/2016 0816   K 3.8 01/19/2019 0747   K 4.4 12/24/2016 0816   CL 108 01/19/2019 0747   CL 106 06/30/2012 1004   CO2 21 (L) 01/19/2019 0747   CO2 28 12/24/2016 0816   BUN 11 01/19/2019 0747   BUN 15.8 12/24/2016 0816   CREATININE 0.76 01/19/2019 0747   CREATININE 0.87 07/22/2017 0826   CREATININE 1.1 12/24/2016 0816      Component Value Date/Time   CALCIUM 9.0 01/19/2019 0747   CALCIUM 9.6 12/24/2016 0816   ALKPHOS 83 01/19/2019 0747   ALKPHOS 80 12/24/2016 0816   AST 24 01/19/2019 0747   AST 20 07/22/2017 0826   AST 20 12/24/2016 0816   ALT 14 01/19/2019 0747   ALT 11 07/22/2017 0826   ALT 11 12/24/2016 0816   BILITOT 0.5 01/19/2019 0747   BILITOT 0.4 07/22/2017 0826   BILITOT 0.34 12/24/2016 0816       RADIOGRAPHIC STUDIES: No results  found.  ASSESSMENT AND PLAN: This is a very pleasant 69 years old white female with metastatic non-small cell lung cancer diagnosed in October 2007 status post concurrent chemoradiation followed by consolidation chemotherapy and she is currently on maintenance treatment with single agent Alimta status post 198 cycles. The patient continues to tolerate her treatment well with no concerning adverse effects. I recommended for her to proceed with cycle #199 today as planned. I will see her back for follow-up visit in 3 weeks for evaluation before the next cycle of her treatment. For pain management the patient will continue her current pain medication. She was advised to call immediately if she has any concerning symptoms in the interval. The patient voices understanding of current disease status and treatment options and is in agreement with the current care plan. All questions were answered. The patient knows to call the clinic with any problems, questions or concerns. We can certainly see the patient much sooner if necessary.  Disclaimer: This note was dictated with voice recognition software. Similar sounding words can inadvertently be transcribed and may not be corrected upon review.

## 2019-02-09 NOTE — Progress Notes (Signed)
SCr = 1.43 today.  MD aware & monitoring closely. Ok to proceed w/ Alimta as ordered today.  Kennith Center, Pharm.D., CPP 02/09/2019@2 :22 PM

## 2019-02-10 ENCOUNTER — Other Ambulatory Visit: Payer: Self-pay | Admitting: Physician Assistant

## 2019-02-10 ENCOUNTER — Telehealth: Payer: Self-pay | Admitting: Medical Oncology

## 2019-02-10 DIAGNOSIS — C3490 Malignant neoplasm of unspecified part of unspecified bronchus or lung: Secondary | ICD-10-CM

## 2019-02-10 DIAGNOSIS — C3410 Malignant neoplasm of upper lobe, unspecified bronchus or lung: Secondary | ICD-10-CM

## 2019-02-10 MED ORDER — MORPHINE SULFATE ER 60 MG PO TBCR: 60.0000 mg | EXTENDED_RELEASE_TABLET | Freq: Two times a day (BID) | ORAL | 0 refills | Status: DC

## 2019-02-10 MED ORDER — MORPHINE SULFATE 30 MG PO TABS: 30.0000 mg | ORAL_TABLET | ORAL | 0 refills | Status: DC | PRN

## 2019-02-10 NOTE — Telephone Encounter (Signed)
Requests med refill for both pain medications.

## 2019-02-17 ENCOUNTER — Telehealth: Payer: Self-pay

## 2019-02-17 ENCOUNTER — Telehealth: Payer: Self-pay | Admitting: Family Medicine

## 2019-02-17 DIAGNOSIS — E119 Type 2 diabetes mellitus without complications: Secondary | ICD-10-CM

## 2019-02-17 DIAGNOSIS — E785 Hyperlipidemia, unspecified: Secondary | ICD-10-CM

## 2019-02-17 NOTE — Telephone Encounter (Signed)
-----   Message from Ellamae Sia sent at 02/05/2019 12:12 PM EST ----- Regarding: Lab orders for Friday, 2.11.21 Patient is scheduled for CPX labs, please order future labs, Thanks , Karna Christmas

## 2019-02-17 NOTE — Telephone Encounter (Signed)
LVM w COVID screen, front door and back lab info 2.10.2021 TLJ

## 2019-02-19 ENCOUNTER — Other Ambulatory Visit: Payer: Self-pay

## 2019-02-19 ENCOUNTER — Other Ambulatory Visit (INDEPENDENT_AMBULATORY_CARE_PROVIDER_SITE_OTHER): Payer: Medicare Other

## 2019-02-19 DIAGNOSIS — E119 Type 2 diabetes mellitus without complications: Secondary | ICD-10-CM | POA: Diagnosis not present

## 2019-02-19 DIAGNOSIS — E785 Hyperlipidemia, unspecified: Secondary | ICD-10-CM

## 2019-02-19 LAB — LIPID PANEL
Cholesterol: 122 mg/dL (ref 0–200)
HDL: 41 mg/dL (ref >39.00)
LDL Cholesterol: 59 mg/dL (ref 0–99)
NonHDL: 80.68
Total CHOL/HDL Ratio: 3
Triglycerides: 107 mg/dL (ref 0.0–149.0)
VLDL: 21.4 mg/dL (ref 0.0–40.0)

## 2019-02-19 LAB — HEMOGLOBIN A1C: Hgb A1c MFr Bld: 5.9 % (ref 4.6–6.5)

## 2019-02-19 NOTE — Progress Notes (Signed)
No critical labs need to be addressed urgently. We will discuss labs in detail at upcoming office visit.   

## 2019-02-26 ENCOUNTER — Encounter: Payer: Self-pay | Admitting: Family Medicine

## 2019-02-26 ENCOUNTER — Ambulatory Visit: Payer: Medicare Other | Admitting: Family Medicine

## 2019-02-26 ENCOUNTER — Other Ambulatory Visit: Payer: Self-pay

## 2019-02-26 ENCOUNTER — Ambulatory Visit (INDEPENDENT_AMBULATORY_CARE_PROVIDER_SITE_OTHER): Payer: Medicare Other | Admitting: Family Medicine

## 2019-02-26 VITALS — BP 106/54 | Wt 150.0 lb

## 2019-02-26 DIAGNOSIS — E1169 Type 2 diabetes mellitus with other specified complication: Secondary | ICD-10-CM | POA: Diagnosis not present

## 2019-02-26 DIAGNOSIS — R944 Abnormal results of kidney function studies: Secondary | ICD-10-CM | POA: Diagnosis not present

## 2019-02-26 DIAGNOSIS — I10 Essential (primary) hypertension: Secondary | ICD-10-CM | POA: Diagnosis not present

## 2019-02-26 DIAGNOSIS — E1159 Type 2 diabetes mellitus with other circulatory complications: Secondary | ICD-10-CM | POA: Diagnosis not present

## 2019-02-26 DIAGNOSIS — E785 Hyperlipidemia, unspecified: Secondary | ICD-10-CM

## 2019-02-26 DIAGNOSIS — E119 Type 2 diabetes mellitus without complications: Secondary | ICD-10-CM

## 2019-02-26 DIAGNOSIS — I152 Hypertension secondary to endocrine disorders: Secondary | ICD-10-CM

## 2019-02-26 NOTE — Assessment & Plan Note (Signed)
Well controlled. Continue current medication.  

## 2019-02-26 NOTE — Patient Instructions (Signed)
Set up yearly eye exam.

## 2019-02-26 NOTE — Assessment & Plan Note (Signed)
Borderline low today, but pt asymptomatic. Increase fluids.

## 2019-02-26 NOTE — Assessment & Plan Note (Signed)
Well controlled. Continue current medication. On statin. 

## 2019-02-26 NOTE — Progress Notes (Addendum)
VIRTUAL VISIT Due to national recommendations of social distancing due to [place] 19, a virtual visit is felt to be most appropriate for this patient at this time.   I connected with the patient on 02/26/19 at  9:40 AM EST by virtual telehealth platform and verified that I am speaking with the correct person using two identifiers.   Interactive audio and video telecommunications were attempted between this provider and patient, however failed, due to patient having technical difficulties OR patient did not have access to video capability.  We continued and completed visit with audio only.   Virtual Visit via Telephone Note  I connected with patient on 02/26/19  at 10:10 AM  by telephone and verified that I am speaking with the correct person using two identifiers.  Interactive audio and video telecommunications were attempted between this provider and patient, however failed, due to patient having technical difficulties OR patient did not have access to video capability.  We continued and completed visit with audio only.   I discussed the limitations, risks, security and privacy concerns of performing an evaluation and management service by  virtual telehealth platform and the availability of in person appointments. I also discussed with the patient that there may be a patient responsible charge related to this service. The patient expressed understanding and agreed to proceed.  Patient location: Home Provider Location: [facility] Participants: [name] and [place]   Chief Complaint  Patient presents with  . Diabetes    pt reports she checked her feet and can feel everything  . Hypertension    History of Present Illness:  69 year old female presents for 6 month HTN and DM follow up.   In last  Month there has been a decline in GFR from > 60 to GFR  38.  no new meds. Except 3 weeks ago she taking ibuprofen and tylenol with facial surgery.  Some emesis with  chemo.  Diabetes:   Lab Results  Component Value Date   HGBA1C 5.9 02/19/2019  Using medications without difficulties: Hypoglycemic episodes: Hyperglycemic episodes: Feet problems: no ulcers, normal sensation Blood Sugars averaging: not checking eye exam within last year: due    Hypertension:   Slightly low BP today.. on amlodipine, lisinopril, lasix prn   BP Readings from Last 3 Encounters:  02/26/19 (!) 106/54  02/09/19 137/61  01/19/19 (!) 144/57  Using medication without problems or lightheadedness: none Chest pain with exertion:none Edema:none Short of breath: none Average home BPs: Other issues:  LDL at goal  < 100 on  Atorvastatin  Lab Results  Component Value Date   CHOL 122 02/19/2019   HDL 41.00 02/19/2019   LDLCALC 59 02/19/2019   LDLDIRECT 105.5 08/06/2013   TRIG 107.0 02/19/2019   CHOLHDL 3 02/19/2019   Exercise: very active with kids  Diet: good healthy diet   Metastatic lung cancer, mets to brain and bone. Metastatic non-small cell lung cancer initially diagnosed as locally advanced stage IIIB with a right Pancoast tumor involving the vertebral body as well as prominent canal invasion with spinal cord compression in October 2007. The patient also has metastatic disease to the brain DX  in April 2008. Currently undergoing  longterm chemotherapy.  Followed by Dr. Mora Appl.    COVID 19 screen No recent travel or known exposure to COVID19 The patient denies respiratory symptoms of COVID 19 at this time.  The importance of social distancing was discussed today.   Review of Systems  Constitutional: Negative  for chills and fever.  HENT: Negative for congestion and ear pain.   Eyes: Negative for pain and redness.  Respiratory: Negative for cough and shortness of breath.   Cardiovascular: Negative for chest pain, palpitations and leg swelling.  Gastrointestinal: Positive for nausea and vomiting. Negative for abdominal pain, blood in stool,  constipation and diarrhea.  Genitourinary: Negative for dysuria.  Musculoskeletal: Negative for falls and myalgias.  Skin: Negative for rash.  Neurological: Negative for dizziness.  Psychiatric/Behavioral: Negative for depression. The patient is not nervous/anxious.       Past Medical History:  Diagnosis Date  . Anemia   . 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 Carcinomas, basal cell 01/22/2019   two places on face  . CVA (cerebral vascular accident) ([place]) 04/2014   pt states had 2 cva's within 2 wks  . DJD (degenerative joint disease), cervical   . Fibromyalgia   . H/O: pneumonia   . History of tobacco abuse quit 10/08   on nicotine patch  . Hypertension   . Hypokalemia   . Lung cancer ([place]) dx'd 09/2005   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 had chemo and radiation for lung ca  . Thrush 12/11/2010    reports that she quit smoking about 13 years ago. She has a 60.00 pack-year smoking history. She has never used smokeless tobacco. She reports that she does not drink alcohol or use drugs.   Current Outpatient Medications:  .  amLODipine (NORVASC) 10 MG tablet, TAKE 1 TABLET (10 MG TOTAL) BY MOUTH DAILY., Disp: 90 tablet, Rfl: 3 .  aspirin 325 MG EC tablet, TAKE 1 TABLET BY MOUTH EVERY DAY, Disp: 90 tablet, Rfl: 0 .  atorvastatin (LIPITOR) 40 MG tablet, TAKE 1 TABLET BY MOUTH EVERY DAY AT 6 PM, Disp: 90 tablet, Rfl: 3 .  diphenhydrAMINE (BENADRYL) 25 MG tablet, Take 1 tablet (25 mg total) by mouth as directed. 2 hours before CT scan., Disp: 5 tablet, Rfl: 0 .  FeFum-FePoly-FA-B Cmp-C-Biot (INTEGRA PLUS) CAPS, TAKE 1 CAPSULE BY MOUTH EVERY DAY IN THE MORNING, Disp: 90 capsule, Rfl: 3 .  fluticasone (FLONASE) 50 MCG/ACT nasal spray, Place 1-2 sprays into both nostrils daily as needed., Disp: 16 g, Rfl: 5 .  folic acid (FOLVITE) 1 MG tablet, TAKE 1 TABLET BY MOUTH EVERY DAY, Disp: 90 tablet, Rfl: 1 .  furosemide (LASIX) 20 MG tablet, TAKE 1 TABLET BY MOUTH EVERY  DAY AS NEEDED FOR SWELLING, Disp: 20 tablet, Rfl: 1 .  lidocaine-prilocaine (EMLA) cream, APPLY TO PORT-A-CATH AS DIRECTED 1-2 HOURS PRIOR TO CHEMOTHERAPY, Disp: 30 g, Rfl: 1 .  lisinopril (PRINIVIL,ZESTRIL) 20 MG tablet, TAKE 2 TABLETS (40 MG TOTAL) BY MOUTH DAILY., Disp: 180 tablet, Rfl: 3 .  morphine (MS CONTIN) 60 MG 12 hr tablet, Take 1 tablet (60 mg total) by mouth 2 (two) times daily., Disp: 60 tablet, Rfl: 0 .  morphine (MSIR) 30 MG tablet, Take 1 tablet (30 mg total) by mouth every 4 (four) hours as needed (breakthrough pain)., Disp: 60 tablet, Rfl: 0 .  Olopatadine HCl 0.2 % SOLN, INSTILL 1 DROP INTO AFFECTED EYE(S) EVERY DAY, Disp: 2.5 mL, Rfl: 1 .  ondansetron (ZOFRAN) 8 MG tablet, TAKE 1 TABLET BY MOUTH EVERY 8 HOURS AS NEEDED FOR NAUSEA AND VOMITING, Disp: 30 tablet, Rfl: 2 .  predniSONE (DELTASONE) 50 MG tablet, 1 tablet by mouth 13 hours ,7 hours and 1 hour  before CT scan, Disp: 3 tablet, Rfl: 4 .  triamcinolone (KENALOG)  0.025 % ointment, Apply 1 application topically 2 (two) times daily., Disp: 30 g, Rfl: 0   Observations/Objective: Blood pressure (!) 106/54, weight 150 lb (68 kg).  Physical Exam  Physical Exam Constitutional:      General: The patient is not in acute distress. Pulmonary:     Effort: Pulmonary effort is normal. No respiratory distress.  Neurological:     Mental Status: The patient is alert and oriented to person, place, and time.  Psychiatric:        Mood and Affect: Mood normal.        Behavior: Behavior normal.   Assessment and Plan    I discussed the assessment and treatment plan with the patient. The patient was provided an opportunity to ask questions and all were answered. The patient agreed with the plan and demonstrated an understanding of the instructions.   The patient was advised to call back or seek an in-person evaluation if the symptoms worsen or if the condition fails to improve as anticipated.   Hypertension associated with diabetes  ([place]) Borderline low today, but pt asymptomatic. Increase fluids.  Diabetes mellitus with no complication Well controlled. Continue current medication.   Decreased creatinine clearance [place] due to recent mild dehydrationsna NSAIDs for recent surgery. Encouraged pt to restart Boost and to increase liquids. She will stay off NSAIDs.  Re-eval with onc prior to next chemo treatment.  Hyperlipidemia associated with type 2 diabetes mellitus ([place]) Well controlled. Continue current medication.  On statin  Total visit time 20 minutes, > 50% spent counseling and cordinating patients care.   [name], MD

## 2019-02-26 NOTE — Assessment & Plan Note (Signed)
[place] due to recent mild dehydrationsna NSAIDs for recent surgery. Encouraged pt to restart Boost and to increase liquids. She will stay off NSAIDs.  Re-eval with onc prior to next chemo treatment.

## 2019-03-01 NOTE — Progress Notes (Signed)
[facility] OFFICE PROGRESS NOTE  [name], [place] [address]  DIAGNOSIS: Metastatic non-small cell lung cancer initially diagnosed as locally advanced stage IIIB with a right Pancoast tumor involving the vertebral body as well as prominent canal invasion with spinal cord compression in October 2007. The patient also has metastatic disease to the brain in April 2008.  PRIOR THERAPY: 1. Status post concurrent chemoradiation with weekly carboplatin and paclitaxel, last dose was given November 18, 2005. 2. 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 3. Status post gamma knife radiotherapy to a solitary brain lesion located in the superior frontal area of the brain at [facility] in April of 2008. 4. Status post palliative radiotherapy to the lateral abdominal wall metastatic lesion under the care of Dr. Lisbeth Renshaw, completed March of 2009. 5. Status post 6 cycles of systemic chemotherapy with carboplatin and Alimta. Last dose was given July 26, 2007 with disease stabilization. 6. Gamma knife stereotactic radiotherapy to 2 brain lesions one involving the right frontal dural based as well as right parietal lesion performed on 05/07/2012 under the care of Dr. Vallarie Mare at [facility].  CURRENT THERAPY: Maintenance systemic chemotherapy with Alimta 500 MG/M2 every 3 weeks, status post 199cycles.  INTERVAL HISTORY: Danielle [name] 69 y.o. female returns to the clinic for a follow up visit. The patient is feeling well today without any concerning complaints. She recently had a repeat brain MRI that was reportedly negative.  She is tolerating her treatment with maintenance Alimta well without any adverse side effects except for occasional nausea/vomiting, usually occurring a few time a week. She takes zofran for nausea. She denies any fever, chills, night sweats, or significant  weight loss.  She denies any hemoptysis, cough, chest pain.  She reports her baseline shortness of breath with exertion.  She denies any diarrhea or constipation.  She denies any headaches or visual changes.  She is here today for evaluation before starting cycle #200.   MEDICAL HISTORY: Past Medical History:  Diagnosis Date  . Anemia   . 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 Carcinomas, basal cell 01/22/2019   two places on face  . CVA (cerebral vascular accident) ([place]) 04/2014   pt states had 2 cva's within 2 wks  . DJD (degenerative joint disease), cervical   . Fibromyalgia   . H/O: pneumonia   . History of tobacco abuse quit 10/08   on nicotine patch  . Hypertension   . Hypokalemia   . Lung cancer ([place]) dx'd 09/2005   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 had chemo and radiation for lung ca  . Thrush 12/11/2010    ALLERGIES:  is allergic to contrast media [iodinated diagnostic agents]; iohexol; sulfa drugs cross reactors; and co-trimoxazole injection [sulfamethoxazole-trimethoprim].  MEDICATIONS:  Current Outpatient Medications  Medication Sig Dispense Refill  . amLODipine (NORVASC) 10 MG tablet TAKE 1 TABLET (10 MG TOTAL) BY MOUTH DAILY. 90 tablet 3  . aspirin 325 MG EC tablet TAKE 1 TABLET BY MOUTH EVERY DAY 90 tablet 0  . atorvastatin (LIPITOR) 40 MG tablet TAKE 1 TABLET BY MOUTH EVERY DAY AT 6 PM 90 tablet 3  . diphenhydrAMINE (BENADRYL) 25 MG tablet Take 1 tablet (25 mg total) by mouth as directed. 2 hours before CT scan. 5 tablet 0  . FeFum-FePoly-FA-B Cmp-C-Biot (INTEGRA PLUS) CAPS TAKE 1 CAPSULE BY MOUTH EVERY DAY IN THE MORNING 90  capsule 3  . fluticasone (FLONASE) 50 MCG/ACT nasal spray Place 1-2 sprays into both nostrils daily as needed. 16 g 5  . folic acid (FOLVITE) 1 MG tablet TAKE 1 TABLET BY MOUTH EVERY DAY 90 tablet 1  . furosemide (LASIX) 20 MG tablet TAKE 1 TABLET BY MOUTH EVERY DAY AS NEEDED FOR SWELLING 20 tablet 1  .  lidocaine-prilocaine (EMLA) cream APPLY TO PORT-A-CATH AS DIRECTED 1-2 HOURS PRIOR TO CHEMOTHERAPY 30 g 1  . lisinopril (PRINIVIL,ZESTRIL) 20 MG tablet TAKE 2 TABLETS (40 MG TOTAL) BY MOUTH DAILY. 180 tablet 3  . morphine (MS CONTIN) 60 MG 12 hr tablet Take 1 tablet (60 mg total) by mouth 2 (two) times daily. 60 tablet 0  . morphine (MSIR) 30 MG tablet Take 1 tablet (30 mg total) by mouth every 4 (four) hours as needed (breakthrough pain). 60 tablet 0  . Olopatadine HCl 0.2 % SOLN INSTILL 1 DROP INTO AFFECTED EYE(S) EVERY DAY 2.5 mL 1  . ondansetron (ZOFRAN) 8 MG tablet TAKE 1 TABLET BY MOUTH EVERY 8 HOURS AS NEEDED FOR NAUSEA AND VOMITING 30 tablet 2  . predniSONE (DELTASONE) 50 MG tablet 1 tablet by mouth 13 hours ,7 hours and 1 hour  before CT scan 3 tablet 4  . triamcinolone (KENALOG) 0.025 % ointment Apply 1 application topically 2 (two) times daily. 30 g 0   No current facility-administered medications for this visit.   Facility-Administered Medications Ordered in Other Visits  Medication Dose Route Frequency Provider Last Rate Last Admin  . heparin lock flush 100 unit/mL  500 Units Intracatheter Once PRN Curt Bears, MD      . ondansetron [facility]) tablet 8 mg  8 mg Oral Once Curt Bears, MD      . PEMEtrexed (ALIMTA) 800 mg in sodium chloride 0.9 % 100 mL chemo infusion  500 mg/m2 (Order-Specific) Intravenous Once Curt Bears, MD      . sodium chloride flush (NS) 0.9 % injection 10 mL  10 mL Intracatheter PRN Curt Bears, MD        SURGICAL HISTORY:  Past Surgical History:  Procedure Laterality Date  . CERVICAL LAMINECTOMY  1995  . KNEE SURGERY  1990   Left x 2  . 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 LOOP RECORDER IMPLANT N/A 04/19/2014   Procedure: LOOP RECORDER IMPLANT;  Surgeon: Thompson Grayer, MD;  Location: [facility] CATH LAB;  Service: Cardiovascular;  Laterality: N/A;  . OTHER SURGICAL HISTORY  2008   Gamma knife surgery to remove  brain met   . PORTACATH PLACEMENT  -[name]   TIP IN CAVOATRIAL JUNCTION  . TEE WITHOUT CARDIOVERSION N/A 04/19/2014   Procedure: TRANSESOPHAGEAL ECHOCARDIOGRAM (TEE);  Surgeon: Larey Dresser, MD;  Location: [facility];  Service: Cardiovascular;  Laterality: N/A;    REVIEW OF SYSTEMS:   Review of Systems  Constitutional: Negative for appetite change, chills, fatigue, fever and unexpected weight change.  HENT:Positive for nasal congestion.Negative for mouth sores, nosebleeds, sore throat and trouble swallowing.  Eyes: Negative for eye problems and icterus.  Respiratory:Positive for baseline shortness of breath with exertionand baseline cough.Negative for cough, hemoptysis, and wheezing.  Cardiovascular: Negative for chest pain and leg swelling.  Gastrointestinal:Positive for occasional nausea and vomiting.Negative for abdominal pain, constipation, and diarrhea. Genitourinary: Negative for bladder incontinence, difficulty urinating, dysuria, frequency and hematuria.  Musculoskeletal: Negative for back pain, gait problem, neck pain and neck stiffness.  Skin:Positive for several scars on  her skin.Negative for itching and rash.  Neurological: Negative for dizziness, extremity weakness, gait problem, headaches, light-headedness and seizures.  Hematological: Negative for adenopathy. Does not bruise/bleed easily.  Psychiatric/Behavioral: Negative for confusion, depression and sleep disturbance. The patient is not nervous/anxious.    PHYSICAL EXAMINATION:  Blood pressure (!) 165/62, pulse 100, temperature 98.5 F (36.9 C), temperature source Temporal, resp. rate 17, height '5\' 2"'  (1.575 m), weight 150 lb 9.6 oz (68.3 kg), SpO2 92 %.  ECOG PERFORMANCE STATUS: 1 - Symptomatic but completely ambulatory  Physical Exam  Constitutional: Oriented to person, place, and time and well-developed, well-nourished, and in no distress.  HENT:  Head: Normocephalic and atraumatic.   Mouth/Throat: Oropharynx is clear and moist. No oropharyngeal exudate.  Eyes: Conjunctivae are normal. Right eye exhibits no discharge. Left eye exhibits no discharge. No scleral icterus.  Neck: Normal range of motion. Neck supple.  Cardiovascular: Normal rate, regular rhythm, normal heart sounds and intact distal pulses.  Pulmonary/Chest: Effort normal and breath sounds normal. No respiratory distress. No wheezes. No rales.  Abdominal: Soft. Bowel sounds are normal. Exhibits no distension and no mass. There is no tenderness.  Musculoskeletal: Normal range of motion. Exhibits no edema.  Lymphadenopathy:  No cervical adenopathy.  Neurological: Mild drooping of right eye from prior hx of brain tumor.Alert and oriented to person, place, and time. Exhibits normal muscle tone. Gait normal. Coordination normal.  Skin: Skin is warm and dry. No rash noted. Not diaphoretic. No erythema. No pallor.  Psychiatric: Mood, memory and judgment normal.  Vitals reviewed.  LABORATORY DATA: Lab Results  Component Value Date   WBC 8.7 03/02/2019   HGB 11.9 (L) 03/02/2019   HCT 37.1 03/02/2019   MCV 98.7 03/02/2019   PLT 358 03/02/2019      Chemistry      Component Value Date/Time   NA 140 03/02/2019 0755   NA 142 12/24/2016 0816   K 3.9 03/02/2019 0755   K 4.4 12/24/2016 0816   CL 105 03/02/2019 0755   CL 106 06/30/2012 1004   CO2 27 03/02/2019 0755   CO2 28 12/24/2016 0816   BUN 12 03/02/2019 0755   BUN 15.8 12/24/2016 0816   CREATININE 0.84 03/02/2019 0755   CREATININE 0.87 07/22/2017 0826   CREATININE 1.1 12/24/2016 0816      Component Value Date/Time   CALCIUM 9.2 03/02/2019 0755   CALCIUM 9.6 12/24/2016 0816   ALKPHOS 74 03/02/2019 0755   ALKPHOS 80 12/24/2016 0816   AST 22 03/02/2019 0755   AST 20 07/22/2017 0826   AST 20 12/24/2016 0816   ALT 13 03/02/2019 0755   ALT 11 07/22/2017 0826   ALT 11 12/24/2016 0816   BILITOT 0.4 03/02/2019 0755   BILITOT 0.4 07/22/2017 0826    BILITOT 0.34 12/24/2016 0816       RADIOGRAPHIC STUDIES:  No results found.   ASSESSMENT/PLAN:  This isa very pleasant 69 year old Caucasian female with metastatic non-small cell lung cancer who was initially diagnosed in October 2007. She is status post current chemoradiation followed by consolidation chemotherapy.  She is currently undergoing weeklymaintainancechemotherapy with single agent Alimta 500 mg/m. She is status post 199cycles.She is tolerating treatment well without any concerning complaints.  The patient was seen with Dr. Julien Nordmann today. Labs were reviewed. Recommend that she proceeds with cycle #200 today as scheduled.   We will see her back for a follow up visit in 3 weeks for evaluation before starting cycle #201.   We will order a  restaging scan at her next visit to be done prior to cycle 202.   She will continue to use zofran for nausea. She took her zofran prior to coming to her appointment this morning.   Her BP was elevated in the clinic today. She states she took her BP medications and her BP prior to her appointment was 456'Y systolic IFSCX/~60. We will recheck her blood pressure while in the clinic today.   The patient was advised to call immediately if she has any concerning symptoms in the interval. The patient voices understanding of current disease status and treatment options and is in agreement with the current care plan. All questions were answered. The patient knows to call the clinic with any problems, questions or concerns. We can certainly see the patient much sooner if necessary     No orders of the defined types were placed in this encounter.    Danielle Zeisler L Marlaine Arey, PA-C 03/02/19  ADDENDUM: Hematology/Oncology Attending: I had a face-to-face encounter with the patient today.  I recommended her care plan.  This is a very pleasant 69 years old white female with metastatic non-small cell lung cancer diagnosed in October 2007.   The patient is currently undergoing maintenance treatment with single agent Alimta status post 199 cycles.  The patient has been tolerating her treatment well with no concerning adverse effects. I recommended for her to proceed with cycle #200 today as planned. We will arrange for the patient to have repeat CT scan of the chest, abdomen and pelvis before cycle #202. She was advised to call immediately if she has any concerning symptoms in the interval. The patient was advised to call immediately if she has any concerning symptoms in the interval.  Disclaimer: This note was dictated with voice recognition software. Similar sounding words can inadvertently be transcribed and may be missed upon review. Eilleen Kempf, MD 03/02/19

## 2019-03-02 ENCOUNTER — Inpatient Hospital Stay: Payer: Medicare Other

## 2019-03-02 ENCOUNTER — Other Ambulatory Visit: Payer: Self-pay

## 2019-03-02 ENCOUNTER — Inpatient Hospital Stay (HOSPITAL_BASED_OUTPATIENT_CLINIC_OR_DEPARTMENT_OTHER): Payer: Medicare Other | Admitting: Physician Assistant

## 2019-03-02 ENCOUNTER — Encounter: Payer: Self-pay | Admitting: Physician Assistant

## 2019-03-02 VITALS — BP 141/60

## 2019-03-02 VITALS — BP 165/62 | HR 100 | Temp 98.5°F | Resp 17 | Ht 62.0 in | Wt 150.6 lb

## 2019-03-02 DIAGNOSIS — Z95828 Presence of other vascular implants and grafts: Secondary | ICD-10-CM

## 2019-03-02 DIAGNOSIS — C3411 Malignant neoplasm of upper lobe, right bronchus or lung: Secondary | ICD-10-CM

## 2019-03-02 DIAGNOSIS — C3490 Malignant neoplasm of unspecified part of unspecified bronchus or lung: Secondary | ICD-10-CM

## 2019-03-02 DIAGNOSIS — Z923 Personal history of irradiation: Secondary | ICD-10-CM | POA: Diagnosis not present

## 2019-03-02 DIAGNOSIS — Z5111 Encounter for antineoplastic chemotherapy: Secondary | ICD-10-CM | POA: Diagnosis not present

## 2019-03-02 DIAGNOSIS — C7989 Secondary malignant neoplasm of other specified sites: Secondary | ICD-10-CM | POA: Diagnosis not present

## 2019-03-02 DIAGNOSIS — C7931 Secondary malignant neoplasm of brain: Secondary | ICD-10-CM | POA: Diagnosis not present

## 2019-03-02 DIAGNOSIS — G893 Neoplasm related pain (acute) (chronic): Secondary | ICD-10-CM | POA: Diagnosis not present

## 2019-03-02 LAB — CBC WITH DIFFERENTIAL/PLATELET
Abs Immature Granulocytes: 0.02 10*3/uL (ref 0.00–0.07)
Basophils Absolute: 0 10*3/uL (ref 0.0–0.1)
Basophils Relative: 0 %
Eosinophils Absolute: 0.1 10*3/uL (ref 0.0–0.5)
Eosinophils Relative: 1 %
HCT: 37.1 % (ref 36.0–46.0)
Hemoglobin: 11.9 g/dL — ABNORMAL LOW (ref 12.0–15.0)
Immature Granulocytes: 0 %
Lymphocytes Relative: 21 %
Lymphs Abs: 1.8 10*3/uL (ref 0.7–4.0)
MCH: 31.6 pg (ref 26.0–34.0)
MCHC: 32.1 g/dL (ref 30.0–36.0)
MCV: 98.7 fL (ref 80.0–100.0)
Monocytes Absolute: 0.9 10*3/uL (ref 0.1–1.0)
Monocytes Relative: 10 %
Neutro Abs: 6 10*3/uL (ref 1.7–7.7)
Neutrophils Relative %: 68 %
Platelets: 358 10*3/uL (ref 150–400)
RBC: 3.76 MIL/uL — ABNORMAL LOW (ref 3.87–5.11)
RDW: 13.1 % (ref 11.5–15.5)
WBC: 8.7 10*3/uL (ref 4.0–10.5)
nRBC: 0 % (ref 0.0–0.2)

## 2019-03-02 LAB — COMPREHENSIVE METABOLIC PANEL
ALT: 13 U/L (ref 0–44)
AST: 22 U/L (ref 15–41)
Albumin: 3.4 g/dL — ABNORMAL LOW (ref 3.5–5.0)
Alkaline Phosphatase: 74 U/L (ref 38–126)
Anion gap: 8 (ref 5–15)
BUN: 12 mg/dL (ref 8–23)
CO2: 27 mmol/L (ref 22–32)
Calcium: 9.2 mg/dL (ref 8.9–10.3)
Chloride: 105 mmol/L (ref 98–111)
Creatinine, Ser: 0.84 mg/dL (ref 0.44–1.00)
GFR calc Af Amer: >60 mL/min (ref >60)
GFR calc non Af Amer: >60 mL/min (ref >60)
Glucose, Bld: 164 mg/dL — ABNORMAL HIGH (ref 70–99)
Potassium: 3.9 mmol/L (ref 3.5–5.1)
Sodium: 140 mmol/L (ref 135–145)
Total Bilirubin: 0.4 mg/dL (ref 0.3–1.2)
Total Protein: 6.7 g/dL (ref 6.5–8.1)

## 2019-03-02 MED ORDER — HEPARIN SOD (PORK) LOCK FLUSH 100 UNIT/ML IV SOLN
500.0000 [IU] | Freq: Once | INTRAVENOUS | Status: AC | PRN
  Administered 2019-03-02: 10:00:00 500 [IU]
  Filled 2019-03-02: qty 5

## 2019-03-02 MED ORDER — SODIUM CHLORIDE 0.9 % IV SOLN
Freq: Once | INTRAVENOUS | Status: AC
  Filled 2019-03-02: qty 250

## 2019-03-02 MED ORDER — DEXAMETHASONE SODIUM PHOSPHATE 10 MG/ML IJ SOLN
10.0000 mg | Freq: Once | INTRAMUSCULAR | Status: AC
  Administered 2019-03-02: 09:00:00 10 mg via INTRAVENOUS

## 2019-03-02 MED ORDER — SODIUM CHLORIDE 0.9% FLUSH
10.0000 mL | INTRAVENOUS | Status: DC | PRN
  Administered 2019-03-02: 10 mL
  Filled 2019-03-02: qty 10

## 2019-03-02 MED ORDER — DEXAMETHASONE SODIUM PHOSPHATE 10 MG/ML IJ SOLN
INTRAMUSCULAR | Status: AC
  Filled 2019-03-02: qty 1

## 2019-03-02 MED ORDER — ONDANSETRON HCL 8 MG PO TABS: 8.0000 mg | ORAL_TABLET | Freq: Once | ORAL | Status: DC

## 2019-03-02 MED ORDER — SODIUM CHLORIDE 0.9 % IV SOLN
500.0000 mg/m2 | Freq: Once | INTRAVENOUS | Status: AC
  Administered 2019-03-02: 10:00:00 800 mg via INTRAVENOUS
  Filled 2019-03-02: qty 20

## 2019-03-02 MED ORDER — SODIUM CHLORIDE 0.9% FLUSH
10.0000 mL | Freq: Once | INTRAVENOUS | Status: AC
  Administered 2019-03-02: 08:00:00 10 mL
  Filled 2019-03-02: qty 10

## 2019-03-02 NOTE — Addendum Note (Signed)
Addended by: [name] E on: 03/02/2019 06:09 PM   Modules accepted: Level of Service

## 2019-03-02 NOTE — Patient Instructions (Signed)
[facility] Discharge Instructions for Patients Receiving Chemotherapy  Today you received the following chemotherapy agents: Pemetrexed (Alimta)  To help prevent nausea and vomiting after your treatment, we encourage you to take your nausea medication as directed.    If you develop nausea and vomiting that is not controlled by your nausea medication, call the clinic.   BELOW ARE SYMPTOMS THAT SHOULD BE REPORTED IMMEDIATELY:  *FEVER GREATER THAN 100.5 F  *CHILLS WITH OR WITHOUT FEVER  NAUSEA AND VOMITING THAT IS NOT CONTROLLED WITH YOUR NAUSEA MEDICATION  *UNUSUAL SHORTNESS OF BREATH  *UNUSUAL BRUISING OR BLEEDING  TENDERNESS IN MOUTH AND THROAT WITH OR WITHOUT PRESENCE OF ULCERS  *URINARY PROBLEMS  *BOWEL PROBLEMS  UNUSUAL RASH Items with * indicate a potential emergency and should be followed up as soon as possible.  Feel free to call the clinic should you have any questions or concerns. The clinic phone number is [phone].  Please show the [facility] at check-in to the Emergency Department and triage nurse.

## 2019-03-07 ENCOUNTER — Other Ambulatory Visit: Payer: Self-pay | Admitting: Internal Medicine

## 2019-03-07 DIAGNOSIS — C3411 Malignant neoplasm of upper lobe, right bronchus or lung: Secondary | ICD-10-CM

## 2019-03-07 DIAGNOSIS — C7931 Secondary malignant neoplasm of brain: Secondary | ICD-10-CM

## 2019-03-10 ENCOUNTER — Other Ambulatory Visit: Payer: Self-pay | Admitting: Internal Medicine

## 2019-03-15 ENCOUNTER — Other Ambulatory Visit: Payer: Self-pay | Admitting: Internal Medicine

## 2019-03-15 ENCOUNTER — Telehealth: Payer: Self-pay | Admitting: Medical Oncology

## 2019-03-15 DIAGNOSIS — C3410 Malignant neoplasm of upper lobe, unspecified bronchus or lung: Secondary | ICD-10-CM

## 2019-03-15 DIAGNOSIS — C3490 Malignant neoplasm of unspecified part of unspecified bronchus or lung: Secondary | ICD-10-CM

## 2019-03-15 MED ORDER — MORPHINE SULFATE ER 60 MG PO TBCR: 60.0000 mg | EXTENDED_RELEASE_TABLET | Freq: Two times a day (BID) | ORAL | 0 refills | Status: DC

## 2019-03-15 NOTE — Telephone Encounter (Signed)
Refill request for [place] 60 mg

## 2019-03-18 ENCOUNTER — Other Ambulatory Visit: Payer: Self-pay | Admitting: Family Medicine

## 2019-03-18 ENCOUNTER — Other Ambulatory Visit: Payer: Self-pay | Admitting: Internal Medicine

## 2019-03-18 DIAGNOSIS — C3411 Malignant neoplasm of upper lobe, right bronchus or lung: Secondary | ICD-10-CM

## 2019-03-22 NOTE — Progress Notes (Signed)
[facility] OFFICE PROGRESS NOTE  Danielle Sanders, MD [place] [address]  DIAGNOSIS: Metastatic non-small cell lung cancer initially diagnosed as locally advanced stage IIIB with a right Pancoast tumor involving the vertebral body as well as prominent canal invasion with spinal cord compression in October 2007. The patient also has metastatic disease to the brain in April 2008.  PRIOR THERAPY: 1. Status post concurrent chemoradiation with weekly carboplatin and paclitaxel, last dose was given November 18, 2005. 2. 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 3. Status post gamma knife radiotherapy to a solitary brain lesion located in the superior frontal area of the brain at [facility] in April of 2008. 4. Status post palliative radiotherapy to the lateral abdominal wall metastatic lesion under the care of Dr. Lisbeth Renshaw, completed March of 2009. 5. Status post 6 cycles of systemic chemotherapy with carboplatin and Alimta. Last dose was given July 26, 2007 with disease stabilization. 6. Gamma knife stereotactic radiotherapy to 2 brain lesions one involving the right frontal dural based as well as right parietal lesion performed on 05/07/2012 under the care of Dr. Vallarie Mare at [facility].  CURRENT THERAPY: Maintenance systemic chemotherapy with Alimta 500 MG/M2 every 3 weeks, status post 200cycles.  INTERVAL HISTORY: Danielle [name] 69 y.o. female returns to the clinic for a follow up visit. The patient is feeling well today without any concerning complaints. She is tolerating her treatment with maintenance Alimta well without any adverse side effects except for occasional nausea/vomiting, usually occurring a few time a week. She takes zofran for nausea.She denies any fever, chills, night sweats, or significantweight loss. She denies any hemoptysis, cough,chest pain. She  reports her baseline shortness of breath with exertion.She denies any diarrhea or constipation. She denies any headaches or visual changes. She is here today for evaluation before starting cycle #201.  MEDICAL HISTORY: Past Medical History:  Diagnosis Date  . Anemia   . 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 Carcinomas, basal cell 01/22/2019   two places on face  . CVA (cerebral vascular accident) ([place]) 04/2014   pt states had 2 cva's within 2 wks  . DJD (degenerative joint disease), cervical   . Fibromyalgia   . H/O: pneumonia   . History of tobacco abuse quit 10/08   on nicotine patch  . Hypertension   . Hypokalemia   . Lung cancer ([place]) dx'd 09/2005   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 had chemo and radiation for lung ca  . Thrush 12/11/2010    ALLERGIES:  is allergic to contrast media [iodinated diagnostic agents]; iohexol; sulfa drugs cross reactors; and co-trimoxazole injection [sulfamethoxazole-trimethoprim].  MEDICATIONS:  Current Outpatient Medications  Medication Sig Dispense Refill  . amLODipine (NORVASC) 10 MG tablet TAKE 1 TABLET (10 MG TOTAL) BY MOUTH DAILY. 90 tablet 3  . aspirin 325 MG EC tablet TAKE 1 TABLET BY MOUTH EVERY DAY 90 tablet 0  . atorvastatin (LIPITOR) 40 MG tablet TAKE 1 TABLET BY MOUTH EVERY DAY AT 6 PM 90 tablet 3  . diphenhydrAMINE (BENADRYL) 25 MG tablet Take 1 tablet (25 mg total) by mouth as directed. 2 hours before CT scan. 5 tablet 0  . FeFum-FePoly-FA-B Cmp-C-Biot (INTEGRA PLUS) CAPS TAKE 1 CAPSULE BY MOUTH EVERY DAY IN THE MORNING 90 capsule 3  . fluticasone (FLONASE) 50 MCG/ACT nasal spray Place 1-2 sprays into both nostrils daily as needed. 16 g 5  .  folic acid (FOLVITE) 1 MG tablet TAKE 1 TABLET BY MOUTH EVERY DAY 90 tablet 1  . furosemide (LASIX) 20 MG tablet TAKE 1 TABLET BY MOUTH EVERY DAY AS NEEDED FOR SWELLING 20 tablet 1  . lidocaine-prilocaine (EMLA) cream APPLY TO PORT-A-CATH AS DIRECTED 1-2 HOURS PRIOR  TO CHEMOTHERAPY 30 g 1  . lisinopril (ZESTRIL) 20 MG tablet TAKE 2 TABLETS BY MOUTH EVERY DAY 180 tablet 3  . morphine (MS CONTIN) 60 MG 12 hr tablet Take 1 tablet (60 mg total) by mouth 2 (two) times daily. 60 tablet 0  . morphine (MSIR) 30 MG tablet Take 1 tablet (30 mg total) by mouth every 4 (four) hours as needed (breakthrough pain). 60 tablet 0  . Olopatadine HCl 0.2 % SOLN INSTILL 1 DROP INTO AFFECTED EYE(S) EVERY DAY 2.5 mL 1  . ondansetron (ZOFRAN) 8 MG tablet TAKE 1 TABLET BY MOUTH EVERY 8 HOURS AS NEEDED FOR NAUSEA AND VOMITING 30 tablet 2  . predniSONE (DELTASONE) 50 MG tablet 1 tablet by mouth 13 hours ,7 hours and 1 hour  before CT scan 3 tablet 4  . triamcinolone (KENALOG) 0.025 % ointment Apply 1 application topically 2 (two) times daily. 30 g 0   No current facility-administered medications for this visit.   Facility-Administered Medications Ordered in Other Visits  Medication Dose Route Frequency Provider Last Rate Last Admin  . cyanocobalamin ((VITAMIN B-12)) injection 1,000 mcg  1,000 mcg Intramuscular Once Curt Bears, MD      . dexamethasone (DECADRON) injection 10 mg  10 mg Intravenous Once Curt Bears, MD      . heparin lock flush 100 unit/mL  500 Units Intracatheter Once PRN Curt Bears, MD      . ondansetron [facility]) tablet 8 mg  8 mg Oral Once Curt Bears, MD      . PEMEtrexed (ALIMTA) 800 mg in sodium chloride 0.9 % 100 mL chemo infusion  500 mg/m2 (Order-Specific) Intravenous Once Curt Bears, MD      . sodium chloride flush (NS) 0.9 % injection 10 mL  10 mL Intracatheter PRN Curt Bears, MD        SURGICAL HISTORY:  Past Surgical History:  Procedure Laterality Date  . CERVICAL LAMINECTOMY  1995  . KNEE SURGERY  1990   Left x 2  . 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 LOOP RECORDER IMPLANT N/A 04/19/2014   Procedure: LOOP RECORDER IMPLANT;  Surgeon: Thompson Grayer, MD;  Location: [facility] CATH LAB;  Service:  Cardiovascular;  Laterality: N/A;  . OTHER SURGICAL HISTORY  2008   Gamma knife surgery to remove brain met   . PORTACATH PLACEMENT  -[name]   TIP IN CAVOATRIAL JUNCTION  . TEE WITHOUT CARDIOVERSION N/A 04/19/2014   Procedure: TRANSESOPHAGEAL ECHOCARDIOGRAM (TEE);  Surgeon: Larey Dresser, MD;  Location: [facility];  Service: Cardiovascular;  Laterality: N/A;    REVIEW OF SYSTEMS:   Review of Systems  Constitutional: Negative for appetite change, chills, fatigue, fever and unexpected weight change.  HENT:Negative for mouth sores, nosebleeds, sore throat and trouble swallowing.  Eyes: Negative for eye problems and icterus.  Respiratory:Positive for baseline shortness of breath with exertionand baseline cough.Negative for cough, hemoptysis, and wheezing.  Cardiovascular: Negative for chest pain and leg swelling.  Gastrointestinal:Positive for occasional nausea and vomiting.Negative for abdominal pain, constipation, and diarrhea. Genitourinary: Negative for bladder incontinence, difficulty urinating, dysuria, frequency and hematuria.  Musculoskeletal: Negative for back pain, gait problem, neck  pain and neck stiffness.  Skin:Positive for several scars on her skin.Negative for itching and rash.  Neurological: Negative for dizziness, extremity weakness, gait problem, headaches, light-headedness and seizures.  Hematological: Negative for adenopathy. Does not bruise/bleed easily.  Psychiatric/Behavioral: Negative for confusion, depression and sleep disturbance. The patient is not nervous/anxious.   PHYSICAL EXAMINATION:  Blood pressure 137/66, pulse 97, temperature 99.1 F (37.3 C), temperature source Temporal, resp. rate 18, height '5\' 2"'  (1.575 m), weight 151 lb 4.8 oz (68.6 kg), SpO2 97 %.  ECOG PERFORMANCE STATUS: 1 - Symptomatic but completely ambulatory  Physical Exam  Constitutional: Oriented to person, place, and time and well-developed, well-nourished, and in no  distress. No distress.  HENT:  Head: Normocephalic and atraumatic.  Mouth/Throat: Oropharynx is clear and moist. No oropharyngeal exudate.  Eyes: Conjunctivae are normal. Right eye exhibits no discharge. Left eye exhibits no discharge. No scleral icterus.  Neck: Normal range of motion. Neck supple.  Cardiovascular: Normal rate, regular rhythm, normal heart sounds and intact distal pulses.   Pulmonary/Chest: Effort normal and breath sounds normal. No respiratory distress. No wheezes. No rales.  Abdominal: Soft. Bowel sounds are normal. Exhibits no distension and no mass. There is no tenderness.  Musculoskeletal: Normal range of motion. Exhibits no edema.  Lymphadenopathy:    No cervical adenopathy.  Neurological: Alert and oriented to person, place, and time. Exhibits normal muscle tone. Gait normal. Coordination normal.  Skin: Skin is warm and dry. No rash noted. Not diaphoretic. No erythema. No pallor.  Psychiatric: Mood, memory and judgment normal.  Vitals reviewed.  LABORATORY DATA: Lab Results  Component Value Date   WBC 9.2 03/23/2019   HGB 12.6 03/23/2019   HCT 39.7 03/23/2019   MCV 99.0 03/23/2019   PLT 411 (H) 03/23/2019      Chemistry      Component Value Date/Time   NA 140 03/23/2019 0815   NA 142 12/24/2016 0816   K 4.5 03/23/2019 0815   K 4.4 12/24/2016 0816   CL 105 03/23/2019 0815   CL 106 06/30/2012 1004   CO2 24 03/23/2019 0815   CO2 28 12/24/2016 0816   BUN 12 03/23/2019 0815   BUN 15.8 12/24/2016 0816   CREATININE 1.01 (H) 03/23/2019 0815   CREATININE 0.87 07/22/2017 0826   CREATININE 1.1 12/24/2016 0816      Component Value Date/Time   CALCIUM 9.3 03/23/2019 0815   CALCIUM 9.6 12/24/2016 0816   ALKPHOS 83 03/23/2019 0815   ALKPHOS 80 12/24/2016 0816   AST 23 03/23/2019 0815   AST 20 07/22/2017 0826   AST 20 12/24/2016 0816   ALT 10 03/23/2019 0815   ALT 11 07/22/2017 0826   ALT 11 12/24/2016 0816   BILITOT 0.4 03/23/2019 0815   BILITOT 0.4  07/22/2017 0826   BILITOT 0.34 12/24/2016 0816       RADIOGRAPHIC STUDIES:  No results found.   ASSESSMENT/PLAN:  This isa very pleasant 69 year old Caucasian female with metastatic non-small cell lung cancer who was initially diagnosed in October 2007. She is status post current chemoradiation followed by consolidation chemotherapy.  She is currently undergoing weeklymaintainancechemotherapy with single agent Alimta 500 mg/m. She is status post 200cycles.She is tolerating treatment well without any concerning complaints.   Labs were reviewed. Recommend that she proceeds with cycle #201 today as scheduled.   I will arrange for a restaging CT scan of her chest, abdomen, and pelvis prior to her next visit in 3 weeks. She was given a prescription for  a the prednisone pre-medication due to her dye allergy.   We will see her back for a follow up visit in 3 weeks for evaluation and to review her scan before starting cycle #202.   She will continue to use zofran for nausea. She took her zofran prior to coming to her appointment this morning.   The patient was advised to call immediately if she has any concerning symptoms in the interval. The patient voices understanding of current disease status and treatment options and is in agreement with the current care plan. All questions were answered. The patient knows to call the clinic with any problems, questions or concerns. We can certainly see the patient much sooner if necessary     Orders Placed This Encounter  Procedures  . CT Chest W Contrast    Standing Status:   Future    Standing Expiration Date:   03/22/2020    Order Specific Question:   ** REASON FOR EXAM (FREE TEXT)    Answer:   Restaging Lung Cancer    Order Specific Question:   If indicated for the ordered procedure, I authorize the administration of contrast media per Radiology protocol    Answer:   Yes    Order Specific Question:   Preferred imaging location?     Answer:   [facility]    Order Specific Question:   Radiology Contrast Protocol - do NOT remove file path    Answer:   \\[email or web address]  . CT Abdomen Pelvis W Contrast    Standing Status:   Future    Standing Expiration Date:   03/22/2020    Order Specific Question:   ** REASON FOR EXAM (FREE TEXT)    Answer:   restaging Lung cancer    Order Specific Question:   If indicated for the ordered procedure, I authorize the administration of contrast media per Radiology protocol    Answer:   Yes    Order Specific Question:   Preferred imaging location?    Answer:   [facility]    Order Specific Question:   Is Oral Contrast requested for this exam?    Answer:   Yes, Per Radiology protocol    Order Specific Question:   Radiology Contrast Protocol - do NOT remove file path    Answer:   \\[email or web address]     [place], PA-C 03/23/19

## 2019-03-23 ENCOUNTER — Inpatient Hospital Stay: Payer: Medicare Other | Attending: Internal Medicine | Admitting: Internal Medicine

## 2019-03-23 ENCOUNTER — Other Ambulatory Visit: Payer: Self-pay

## 2019-03-23 ENCOUNTER — Encounter: Payer: Self-pay | Admitting: Physician Assistant

## 2019-03-23 ENCOUNTER — Inpatient Hospital Stay (HOSPITAL_BASED_OUTPATIENT_CLINIC_OR_DEPARTMENT_OTHER): Payer: Medicare Other | Admitting: Physician Assistant

## 2019-03-23 ENCOUNTER — Inpatient Hospital Stay: Payer: Medicare Other

## 2019-03-23 VITALS — BP 137/66 | HR 97 | Temp 99.1°F | Resp 18 | Ht 62.0 in | Wt 151.3 lb

## 2019-03-23 DIAGNOSIS — Z91041 Radiographic dye allergy status: Secondary | ICD-10-CM

## 2019-03-23 DIAGNOSIS — C7931 Secondary malignant neoplasm of brain: Secondary | ICD-10-CM | POA: Insufficient documentation

## 2019-03-23 DIAGNOSIS — R0602 Shortness of breath: Secondary | ICD-10-CM | POA: Insufficient documentation

## 2019-03-23 DIAGNOSIS — Z85118 Personal history of other malignant neoplasm of bronchus and lung: Secondary | ICD-10-CM | POA: Diagnosis not present

## 2019-03-23 DIAGNOSIS — C7951 Secondary malignant neoplasm of bone: Secondary | ICD-10-CM | POA: Insufficient documentation

## 2019-03-23 DIAGNOSIS — C3411 Malignant neoplasm of upper lobe, right bronchus or lung: Secondary | ICD-10-CM

## 2019-03-23 DIAGNOSIS — M47812 Spondylosis without myelopathy or radiculopathy, cervical region: Secondary | ICD-10-CM | POA: Diagnosis not present

## 2019-03-23 DIAGNOSIS — E876 Hypokalemia: Secondary | ICD-10-CM | POA: Diagnosis not present

## 2019-03-23 DIAGNOSIS — C3410 Malignant neoplasm of upper lobe, unspecified bronchus or lung: Secondary | ICD-10-CM | POA: Diagnosis not present

## 2019-03-23 DIAGNOSIS — M797 Fibromyalgia: Secondary | ICD-10-CM | POA: Insufficient documentation

## 2019-03-23 DIAGNOSIS — I10 Essential (primary) hypertension: Secondary | ICD-10-CM | POA: Insufficient documentation

## 2019-03-23 DIAGNOSIS — Z5111 Encounter for antineoplastic chemotherapy: Secondary | ICD-10-CM

## 2019-03-23 DIAGNOSIS — Z7982 Long term (current) use of aspirin: Secondary | ICD-10-CM | POA: Insufficient documentation

## 2019-03-23 DIAGNOSIS — R112 Nausea with vomiting, unspecified: Secondary | ICD-10-CM | POA: Insufficient documentation

## 2019-03-23 DIAGNOSIS — Z8673 Personal history of transient ischemic attack (TIA), and cerebral infarction without residual deficits: Secondary | ICD-10-CM | POA: Insufficient documentation

## 2019-03-23 DIAGNOSIS — C3490 Malignant neoplasm of unspecified part of unspecified bronchus or lung: Secondary | ICD-10-CM | POA: Diagnosis not present

## 2019-03-23 DIAGNOSIS — Z79899 Other long term (current) drug therapy: Secondary | ICD-10-CM | POA: Insufficient documentation

## 2019-03-23 LAB — COMPREHENSIVE METABOLIC PANEL
ALT: 10 U/L (ref 0–44)
AST: 23 U/L (ref 15–41)
Albumin: 3.6 g/dL (ref 3.5–5.0)
Alkaline Phosphatase: 83 U/L (ref 38–126)
Anion gap: 11 (ref 5–15)
BUN: 12 mg/dL (ref 8–23)
CO2: 24 mmol/L (ref 22–32)
Calcium: 9.3 mg/dL (ref 8.9–10.3)
Chloride: 105 mmol/L (ref 98–111)
Creatinine, Ser: 1.01 mg/dL — ABNORMAL HIGH (ref 0.44–1.00)
GFR calc Af Amer: >60 mL/min (ref >60)
GFR calc non Af Amer: 58 mL/min — ABNORMAL LOW (ref >60)
Glucose, Bld: 167 mg/dL — ABNORMAL HIGH (ref 70–99)
Potassium: 4.5 mmol/L (ref 3.5–5.1)
Sodium: 140 mmol/L (ref 135–145)
Total Bilirubin: 0.4 mg/dL (ref 0.3–1.2)
Total Protein: 6.9 g/dL (ref 6.5–8.1)

## 2019-03-23 LAB — CBC WITH DIFFERENTIAL/PLATELET
Abs Immature Granulocytes: 0.03 10*3/uL (ref 0.00–0.07)
Basophils Absolute: 0.1 10*3/uL (ref 0.0–0.1)
Basophils Relative: 1 %
Eosinophils Absolute: 0.1 10*3/uL (ref 0.0–0.5)
Eosinophils Relative: 1 %
HCT: 39.7 % (ref 36.0–46.0)
Hemoglobin: 12.6 g/dL (ref 12.0–15.0)
Immature Granulocytes: 0 %
Lymphocytes Relative: 23 %
Lymphs Abs: 2.1 10*3/uL (ref 0.7–4.0)
MCH: 31.4 pg (ref 26.0–34.0)
MCHC: 31.7 g/dL (ref 30.0–36.0)
MCV: 99 fL (ref 80.0–100.0)
Monocytes Absolute: 1.2 10*3/uL — ABNORMAL HIGH (ref 0.1–1.0)
Monocytes Relative: 13 %
Neutro Abs: 5.8 10*3/uL (ref 1.7–7.7)
Neutrophils Relative %: 62 %
Platelets: 411 10*3/uL — ABNORMAL HIGH (ref 150–400)
RBC: 4.01 MIL/uL (ref 3.87–5.11)
RDW: 13.3 % (ref 11.5–15.5)
WBC: 9.2 10*3/uL (ref 4.0–10.5)
nRBC: 0 % (ref 0.0–0.2)

## 2019-03-23 MED ORDER — SODIUM CHLORIDE 0.9 % IV SOLN
500.0000 mg/m2 | Freq: Once | INTRAVENOUS | Status: AC
  Administered 2019-03-23: 800 mg via INTRAVENOUS
  Filled 2019-03-23: qty 20

## 2019-03-23 MED ORDER — HEPARIN SOD (PORK) LOCK FLUSH 100 UNIT/ML IV SOLN
500.0000 [IU] | Freq: Once | INTRAVENOUS | Status: AC | PRN
  Administered 2019-03-23: 500 [IU]
  Filled 2019-03-23: qty 5

## 2019-03-23 MED ORDER — SODIUM CHLORIDE 0.9% FLUSH
10.0000 mL | INTRAVENOUS | Status: DC | PRN
  Administered 2019-03-23: 10 mL
  Filled 2019-03-23: qty 10

## 2019-03-23 MED ORDER — CYANOCOBALAMIN 1000 MCG/ML IJ SOLN
1000.0000 ug | Freq: Once | INTRAMUSCULAR | Status: AC
  Administered 2019-03-23: 1000 ug via INTRAMUSCULAR

## 2019-03-23 MED ORDER — DEXAMETHASONE SODIUM PHOSPHATE 10 MG/ML IJ SOLN
10.0000 mg | Freq: Once | INTRAMUSCULAR | Status: AC
  Administered 2019-03-23: 10 mg via INTRAVENOUS

## 2019-03-23 MED ORDER — ONDANSETRON HCL 8 MG PO TABS
8.0000 mg | ORAL_TABLET | Freq: Once | ORAL | Status: AC
  Administered 2019-03-23: 8 mg via ORAL

## 2019-03-23 MED ORDER — PREDNISONE 50 MG PO TABS: ORAL_TABLET | ORAL | 4 refills | Status: DC

## 2019-03-23 MED ORDER — DEXAMETHASONE SODIUM PHOSPHATE 10 MG/ML IJ SOLN
INTRAMUSCULAR | Status: AC
  Filled 2019-03-23: qty 1

## 2019-03-23 MED ORDER — CYANOCOBALAMIN 1000 MCG/ML IJ SOLN
INTRAMUSCULAR | Status: AC
  Filled 2019-03-23: qty 1

## 2019-03-23 MED ORDER — SODIUM CHLORIDE 0.9 % IV SOLN
Freq: Once | INTRAVENOUS | Status: AC
  Filled 2019-03-23: qty 250

## 2019-03-23 MED ORDER — ONDANSETRON HCL 8 MG PO TABS
ORAL_TABLET | ORAL | Status: AC
  Filled 2019-03-23: qty 1

## 2019-03-23 NOTE — Progress Notes (Signed)
No critical labs need to be addressed urgently. We will discuss labs in detail at upcoming office visit.   

## 2019-03-23 NOTE — Patient Instructions (Signed)

## 2019-03-23 NOTE — Patient Instructions (Signed)
[facility] Discharge Instructions for Patients Receiving Chemotherapy  Today you received the following chemotherapy agents: Alimta.  To help prevent nausea and vomiting after your treatment, we encourage you to take your nausea medication as directed.   If you develop nausea and vomiting that is not controlled by your nausea medication, call the clinic.   BELOW ARE SYMPTOMS THAT SHOULD BE REPORTED IMMEDIATELY:  *FEVER GREATER THAN 100.5 F  *CHILLS WITH OR WITHOUT FEVER  NAUSEA AND VOMITING THAT IS NOT CONTROLLED WITH YOUR NAUSEA MEDICATION  *UNUSUAL SHORTNESS OF BREATH  *UNUSUAL BRUISING OR BLEEDING  TENDERNESS IN MOUTH AND THROAT WITH OR WITHOUT PRESENCE OF ULCERS  *URINARY PROBLEMS  *BOWEL PROBLEMS  UNUSUAL RASH Items with * indicate a potential emergency and should be followed up as soon as possible.  Feel free to call the clinic should you have any questions or concerns. The clinic phone number is [phone].  Please show the [facility] at check-in to the Emergency Department and triage nurse.

## 2019-03-24 ENCOUNTER — Other Ambulatory Visit: Payer: Self-pay | Admitting: Internal Medicine

## 2019-03-24 DIAGNOSIS — C7931 Secondary malignant neoplasm of brain: Secondary | ICD-10-CM

## 2019-03-24 DIAGNOSIS — C3411 Malignant neoplasm of upper lobe, right bronchus or lung: Secondary | ICD-10-CM

## 2019-03-25 ENCOUNTER — Telehealth: Payer: Self-pay | Admitting: Family Medicine

## 2019-03-25 NOTE — Progress Notes (Signed)
  Chronic Care Management   Note  03/25/2019 Name: ROBBIN LOUGHMILLER MRN: [id number] DOB: 10-03-1950  Margarette Asal Herandez is a 69 y.o. year old female who is a primary care patient of Bedsole, [name], MD. I reached out to Cari Caraway by phone today in response to a referral sent by Ms. Margarette Asal Jackson's PCP, [name], MD.   Ms. Sprung was given information about Chronic Care Management services today including:  1. CCM service includes personalized support from designated clinical staff supervised by her physician, including individualized plan of care and coordination with other care providers 2. 24/7 contact phone numbers for assistance for urgent and routine care needs. 3. Service will only be billed when office clinical staff spend 20 minutes or more in a month to coordinate care. 4. Only one practitioner may furnish and bill the service in a calendar month. 5. The patient may stop CCM services at any time (effective at the end of the month) by phone call to the office staff.   Patient agreed to services and verbal consent obtained.   Follow up plan:   Raynicia Dukes [employer] Scheduler

## 2019-03-31 ENCOUNTER — Telehealth: Payer: Self-pay | Admitting: Family Medicine

## 2019-03-31 NOTE — Progress Notes (Signed)
  Chronic Care Management   Note  03/31/2019 Name: [name] MRN: [id number] DOB: [birth year]  Danielle [name] is a 69 y.o. year old female who is a primary care patient of Bedsole, [name], MD. I reached out to Cari Caraway by phone today in response to a referral sent by Ms. Danielle Asal Inzunza's PCP, [name], MD.   [name] was given information about Chronic Care Management services today including:  1. CCM service includes personalized support from designated clinical staff supervised by her physician, including individualized plan of care and coordination with other care providers 2. 24/7 contact phone numbers for assistance for urgent and routine care needs. 3. Service will only be billed when office clinical staff spend 20 minutes or more in a month to coordinate care. 4. Only one practitioner may furnish and bill the service in a calendar month. 5. The patient may stop CCM services at any time (effective at the end of the month) by phone call to the office staff.   Patient agreed to services and verbal consent obtained.   Follow up plan:   Raynicia Dukes [employer] Scheduler

## 2019-04-06 ENCOUNTER — Telehealth: Payer: Self-pay | Admitting: Medical Oncology

## 2019-04-06 NOTE — Telephone Encounter (Signed)
called in prednsione rx .Pt notified.

## 2019-04-07 NOTE — Progress Notes (Signed)
Pharmacist Chemotherapy Monitoring - Follow Up Assessment    I verify that I have reviewed each item in the below checklist:  . Regimen for the patient is scheduled for the appropriate day and plan matches scheduled date. Marland Kitchen Appropriate non-routine labs are ordered dependent on drug ordered. . If applicable, additional medications reviewed and ordered per protocol based on lifetime cumulative doses and/or treatment regimen.   Plan for follow-up and/or issues identified: No . I-vent associated with next due treatment: No . MD and/or nursing notified: No  Berkleigh Beckles K 04/07/2019 9:23 AM

## 2019-04-09 ENCOUNTER — Ambulatory Visit (HOSPITAL_COMMUNITY)
Admission: RE | Admit: 2019-04-09 | Discharge: 2019-04-09 | Disposition: A | Discharge status: Home / Self Care | Payer: Medicare Other | Source: Ambulatory Visit | Attending: Physician Assistant | Admitting: Physician Assistant

## 2019-04-09 ENCOUNTER — Other Ambulatory Visit: Payer: Self-pay

## 2019-04-09 DIAGNOSIS — C3410 Malignant neoplasm of upper lobe, unspecified bronchus or lung: Secondary | ICD-10-CM | POA: Diagnosis not present

## 2019-04-09 DIAGNOSIS — C3490 Malignant neoplasm of unspecified part of unspecified bronchus or lung: Secondary | ICD-10-CM | POA: Diagnosis not present

## 2019-04-09 DIAGNOSIS — Z85118 Personal history of other malignant neoplasm of bronchus and lung: Secondary | ICD-10-CM | POA: Insufficient documentation

## 2019-04-09 MED ORDER — SODIUM CHLORIDE 0.9 % IV SOLN
Freq: Once | INTRAVENOUS | Status: DC
  Filled 2019-04-09: qty 0.5

## 2019-04-09 MED ORDER — SODIUM CHLORIDE (PF) 0.9 % IJ SOLN
INTRAMUSCULAR | Status: AC
  Filled 2019-04-09: qty 50

## 2019-04-09 MED ORDER — HEPARIN SOD (PORK) LOCK FLUSH 100 UNIT/ML IV SOLN
INTRAVENOUS | Status: AC
  Administered 2019-04-09: 09:00:00 500 [IU]
  Filled 2019-04-09: qty 5

## 2019-04-09 MED ORDER — IOHEXOL 300 MG/ML  SOLN
100.0000 mL | Freq: Once | INTRAMUSCULAR | Status: AC
  Administered 2019-04-09: 09:00:00 100 mL via INTRAVENOUS

## 2019-04-09 MED ORDER — IOHEXOL 300 MG/ML  SOLN: 100.0000 mL | Freq: Once | INTRAMUSCULAR | Status: DC

## 2019-04-12 NOTE — Progress Notes (Signed)
[place] OFFICE PROGRESS NOTE  Danielle Sanders, MD [place] [id number]  DIAGNOSIS: Metastatic non-small cell lung cancer initially diagnosed as locally advanced stage IIIB with a right Pancoast tumor involving the vertebral body as well as prominent canal invasion with spinal cord compression in October 2007. The patient also has metastatic disease to the brain in April 2008.  PRIOR THERAPY: 1. Status post concurrent chemoradiation with weekly carboplatin and paclitaxel, last dose was given November 18, 2005. 2. 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 3. Status post gamma knife radiotherapy to a solitary brain lesion located in the superior frontal area of the brain at [facility] in April of 2008. 4. Status post palliative radiotherapy to the lateral abdominal wall metastatic lesion under the care of Dr. Lisbeth Renshaw, completed March of 2009. 5. Status post 6 cycles of systemic chemotherapy with carboplatin and Alimta. Last dose was given July 26, 2007 with disease stabilization. 6. Gamma knife stereotactic radiotherapy to 2 brain lesions one involving the right frontal dural based as well as right parietal lesion performed on 05/07/2012 under the care of Dr. Vallarie Mare at [facility].  CURRENT THERAPY: Maintenance systemic chemotherapy with Alimta 500 MG/M2 every 3 weeks, status post 201cycles.  INTERVAL HISTORY: Danielle [name] 69 y.o. female returns to the clinic for a follow up visit.The patient is feeling well today without any concerning complaints. She had a 24 hour "stomach bug" last week that resolved. She is tolerating her treatment with maintenance Alimta well without any adverse side effects except for occasional nausea/vomiting, usually occurring a few time a week. She takes zofran for nausea.She denies any fever, chills, or significantweight loss. She  reportedly has bad baseline night sweats for several years, which are unchanged. She denies any hemoptysis, cough,chest pain. She reports her baseline shortness of breath with exertion.She denies any diarrhea or constipation. She denies any headaches or visual changes. The patient recently had a restaging CT scan. She is here today for evaluation and to review her scan before starting cycle #202.   MEDICAL HISTORY: Past Medical History:  Diagnosis Date  . Anemia   . 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 Carcinomas, basal cell 01/22/2019   two places on face  . CVA (cerebral vascular accident) ([place]) 04/2014   pt states had 2 cva's within 2 wks  . DJD (degenerative joint disease), cervical   . Fibromyalgia   . H/O: pneumonia   . History of tobacco abuse quit 10/08   on nicotine patch  . Hypertension   . Hypokalemia   . Lung cancer ([place]) dx'd 09/2005   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 had chemo and radiation for lung ca  . Thrush 12/11/2010    ALLERGIES:  is allergic to contrast media [iodinated diagnostic agents]; iohexol; sulfa drugs cross reactors; and co-trimoxazole injection [sulfamethoxazole-trimethoprim].  MEDICATIONS:  Current Outpatient Medications  Medication Sig Dispense Refill  . amLODipine (NORVASC) 10 MG tablet TAKE 1 TABLET (10 MG TOTAL) BY MOUTH DAILY. 90 tablet 3  . aspirin 325 MG EC tablet TAKE 1 TABLET BY MOUTH EVERY DAY 90 tablet 0  . atorvastatin (LIPITOR) 40 MG tablet TAKE 1 TABLET BY MOUTH EVERY DAY AT 6 PM 90 tablet 3  . diphenhydrAMINE (BENADRYL) 25 MG tablet Take 1 tablet (25 mg total) by mouth as directed. 2 hours before CT scan. 5 tablet 0  . FeFum-FePoly-FA-B Cmp-C-Biot (INTEGRA  PLUS) CAPS TAKE 1 CAPSULE BY MOUTH EVERY DAY IN THE MORNING 90 capsule 3  . fluticasone (FLONASE) 50 MCG/ACT nasal spray Place 1-2 sprays into both nostrils daily as needed. 16 g 5  . folic acid (FOLVITE) 1 MG tablet TAKE 1 TABLET BY MOUTH EVERY DAY 90  tablet 1  . furosemide (LASIX) 20 MG tablet TAKE 1 TABLET BY MOUTH EVERY DAY AS NEEDED FOR SWELLING 20 tablet 1  . lidocaine-prilocaine (EMLA) cream APPLY TO PORT-A-CATH AS DIRECTED 1-2 HOURS PRIOR TO CHEMOTHERAPY 30 g 1  . lisinopril (ZESTRIL) 20 MG tablet TAKE 2 TABLETS BY MOUTH EVERY DAY 180 tablet 3  . morphine (MS CONTIN) 60 MG 12 hr tablet Take 1 tablet (60 mg total) by mouth 2 (two) times daily. 60 tablet 0  . morphine (MSIR) 30 MG tablet Take 1 tablet (30 mg total) by mouth every 4 (four) hours as needed (breakthrough pain). 60 tablet 0  . Olopatadine HCl 0.2 % SOLN INSTILL 1 DROP INTO AFFECTED EYE(S) EVERY DAY 2.5 mL 1  . ondansetron (ZOFRAN) 8 MG tablet TAKE 1 TABLET BY MOUTH EVERY 8 HOURS AS NEEDED FOR NAUSEA AND VOMITING 30 tablet 2  . predniSONE (DELTASONE) 50 MG tablet 1 tablet by mouth 13 hours ,7 hours and 1 hour  before CT scan 3 tablet 4  . triamcinolone (KENALOG) 0.025 % ointment Apply 1 application topically 2 (two) times daily. 30 g 0   No current facility-administered medications for this visit.    SURGICAL HISTORY:  Past Surgical History:  Procedure Laterality Date  . CERVICAL LAMINECTOMY  1995  . KNEE SURGERY  1990   Left x 2  . 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 LOOP RECORDER IMPLANT N/A 04/19/2014   Procedure: LOOP RECORDER IMPLANT;  Surgeon: Thompson Grayer, MD;  Location: [facility] CATH LAB;  Service: Cardiovascular;  Laterality: N/A;  . OTHER SURGICAL HISTORY  2008   Gamma knife surgery to remove brain met   . PORTACATH PLACEMENT  -[name]   TIP IN CAVOATRIAL JUNCTION  . TEE WITHOUT CARDIOVERSION N/A 04/19/2014   Procedure: TRANSESOPHAGEAL ECHOCARDIOGRAM (TEE);  Surgeon: Larey Dresser, MD;  Location: [facility];  Service: Cardiovascular;  Laterality: N/A;    REVIEW OF SYSTEMS:   Review of Systems Constitutional: Negative for appetite change, chills, fatigue, fever and unexpected weight change.  HENT:Negative for mouth sores,  nosebleeds, sore throat and trouble swallowing.  Eyes: Negative for eye problems and icterus.  Respiratory:Positive for baseline shortness of breath with exertion. Negative for cough, hemoptysis, and wheezing.  Cardiovascular: Negative for chest pain and leg swelling.  Gastrointestinal:Positive for occasional nausea and vomiting.Negative for abdominal pain, constipation, and diarrhea. Genitourinary: Negative for bladder incontinence, difficulty urinating, dysuria, frequency and hematuria.  Musculoskeletal: Negative for back pain, gait problem, neck pain and neck stiffness.  Skin:Positive for several scars on her skin.Negative for itching and rash.  Neurological: Negative for dizziness, extremity weakness, gait problem, headaches, light-headedness and seizures.  Hematological: Negative for adenopathy. Does not bruise/bleed easily.  Psychiatric/Behavioral: Negative for confusion, depression and sleep disturbance. The patient is not nervous/anxious.   PHYSICAL EXAMINATION:  There were no vitals taken for this visit.  ECOG PERFORMANCE STATUS: 1 - Symptomatic but completely ambulatory  Physical Exam  Constitutional: Oriented to person, place, and time and well-developed, well-nourished, and in no distress.   HENT:  Head: Normocephalic and atraumatic.  Mouth/Throat: Oropharynx is clear and moist. No oropharyngeal exudate.  Eyes: Conjunctivae are normal. Right eye exhibits no discharge. Left eye exhibits no discharge. No scleral icterus.  Neck: Normal range of motion. Neck supple.  Cardiovascular: Normal rate, regular rhythm, normal heart sounds and intact distal pulses.   Pulmonary/Chest: Effort normal and breath sounds quiet in all lung fields. No respiratory distress. No wheezes. No rales.  Abdominal: Soft. Bowel sounds are normal. Exhibits no distension and no mass. There is no tenderness.  Musculoskeletal: Normal range of motion. Exhibits no edema.  Lymphadenopathy:    No  cervical adenopathy.  Neurological: Alert and oriented to person, place, and time. Exhibits normal muscle tone. Gait normal. Coordination normal.  Skin: Skin is warm and dry. No rash noted. Not diaphoretic. No erythema. No pallor.  Psychiatric: Mood, memory and judgment normal.  Vitals reviewed.  LABORATORY DATA: Lab Results  Component Value Date   WBC 9.2 03/23/2019   HGB 12.6 03/23/2019   HCT 39.7 03/23/2019   MCV 99.0 03/23/2019   PLT 411 (H) 03/23/2019      Chemistry      Component Value Date/Time   NA 140 03/23/2019 0815   NA 142 12/24/2016 0816   K 4.5 03/23/2019 0815   K 4.4 12/24/2016 0816   CL 105 03/23/2019 0815   CL 106 06/30/2012 1004   CO2 24 03/23/2019 0815   CO2 28 12/24/2016 0816   BUN 12 03/23/2019 0815   BUN 15.8 12/24/2016 0816   CREATININE 1.01 (H) 03/23/2019 0815   CREATININE 0.87 07/22/2017 0826   CREATININE 1.1 12/24/2016 0816      Component Value Date/Time   CALCIUM 9.3 03/23/2019 0815   CALCIUM 9.6 12/24/2016 0816   ALKPHOS 83 03/23/2019 0815   ALKPHOS 80 12/24/2016 0816   AST 23 03/23/2019 0815   AST 20 07/22/2017 0826   AST 20 12/24/2016 0816   ALT 10 03/23/2019 0815   ALT 11 07/22/2017 0826   ALT 11 12/24/2016 0816   BILITOT 0.4 03/23/2019 0815   BILITOT 0.4 07/22/2017 0826   BILITOT 0.34 12/24/2016 0816       RADIOGRAPHIC STUDIES:  CT Chest W Contrast  Result Date: 04/09/2019 CLINICAL DATA:  Stage IV lung cancer with ongoing chemotherapy. Former smoker. Restaging. EXAM: CT CHEST, ABDOMEN, AND PELVIS WITH CONTRAST TECHNIQUE: Multidetector CT imaging of the chest, abdomen and pelvis was performed following the standard protocol during bolus administration of intravenous contrast. CONTRAST:  182m OMNIPAQUE IOHEXOL 300 MG/ML  SOLN COMPARISON:  10/02/2018 CT chest, abdomen and pelvis. FINDINGS: CT CHEST FINDINGS Cardiovascular: Normal heart size. No significant pericardial effusion/thickening. Left anterior descending and left circumflex  coronary atherosclerosis. Right internal jugular Port-A-Cath terminates at the cavoatrial junction. Atherosclerotic nonaneurysmal thoracic aorta. Top-normal caliber main pulmonary artery (3.1 cm diameter). No central pulmonary emboli. Mediastinum/Nodes: No discrete thyroid nodules. Unremarkable esophagus. No pathologically enlarged axillary, mediastinal or hilar lymph nodes. Lungs/Pleura: No pneumothorax. No pleural effusion. Moderate centrilobular and paraseptal emphysema with diffuse bronchial wall thickening. No acute consolidative airspace disease or lung masses. Sharply marginated patchy reticulation in medial apical right upper lobe is unchanged. Stable right apical pleural thickening and calcification. Tiny solid 3 mm pulmonary nodules in the posterior right upper lobe (series 4/image 36) and superior segment left lower lobe (series 4/image 43) are stable. No new significant pulmonary nodules. Musculoskeletal: No aggressive appearing focal osseous lesions. Loop recorder device in the subcutaneous medial left ventral chest wall. Chronic severe T3 vertebral compression fracture. Vertebroplasty material noted within the T4 and T7 vertebral bodies, unchanged. Mild  thoracic spondylosis. CT ABDOMEN PELVIS FINDINGS Hepatobiliary: Normal liver with no liver mass. Normal gallbladder with no radiopaque cholelithiasis. No biliary ductal dilatation. Pancreas: Normal, with no mass or duct dilation. Spleen: Normal size. No mass. Adrenals/Urinary Tract: Normal adrenals. Subcentimeter hypodense renal cortical lesions in the upper right kidney are too small to characterize and are unchanged. Otherwise normal kidneys, with no hydronephrosis. Normal nondistended bladder. Stomach/Bowel: Normal non-distended stomach. Normal caliber small bowel with no small bowel wall thickening. Normal appendix. Oral contrast transits to the colon. Normal large bowel with no diverticulosis, large bowel wall thickening or pericolonic fat  stranding. Vascular/Lymphatic: Atherosclerotic nonaneurysmal abdominal aorta. Patent portal, splenic, hepatic and renal veins. No pathologically enlarged lymph nodes in the abdomen or pelvis. Reproductive: Grossly normal uterus.  No adnexal mass. Other: No pneumoperitoneum, ascites or focal fluid collection. Musculoskeletal: No aggressive appearing focal osseous lesions. Faint posterior right L1 vertebral sclerosis is unchanged. Mild lumbar spondylosis. IMPRESSION: 1. No evidence of active or progressive metastatic disease in the chest, abdomen or pelvis. Stable post treatment change at the right lung apex. Faint L1 vertebral sclerosis, chronic severe T3 vertebral compression fracture and vertebroplasty at T4 and T7, unchanged. 2. Aortic Atherosclerosis (ICD10-I70.0) and Emphysema (ICD10-J43.9). Electronically Signed   By: Ilona Sorrel M.D.   On: 04/09/2019 09:56   CT Abdomen Pelvis W Contrast  Result Date: 04/09/2019 CLINICAL DATA:  Stage IV lung cancer with ongoing chemotherapy. Former smoker. Restaging. EXAM: CT CHEST, ABDOMEN, AND PELVIS WITH CONTRAST TECHNIQUE: Multidetector CT imaging of the chest, abdomen and pelvis was performed following the standard protocol during bolus administration of intravenous contrast. CONTRAST:  165m OMNIPAQUE IOHEXOL 300 MG/ML  SOLN COMPARISON:  10/02/2018 CT chest, abdomen and pelvis. FINDINGS: CT CHEST FINDINGS Cardiovascular: Normal heart size. No significant pericardial effusion/thickening. Left anterior descending and left circumflex coronary atherosclerosis. Right internal jugular Port-A-Cath terminates at the cavoatrial junction. Atherosclerotic nonaneurysmal thoracic aorta. Top-normal caliber main pulmonary artery (3.1 cm diameter). No central pulmonary emboli. Mediastinum/Nodes: No discrete thyroid nodules. Unremarkable esophagus. No pathologically enlarged axillary, mediastinal or hilar lymph nodes. Lungs/Pleura: No pneumothorax. No pleural effusion. Moderate  centrilobular and paraseptal emphysema with diffuse bronchial wall thickening. No acute consolidative airspace disease or lung masses. Sharply marginated patchy reticulation in medial apical right upper lobe is unchanged. Stable right apical pleural thickening and calcification. Tiny solid 3 mm pulmonary nodules in the posterior right upper lobe (series 4/image 36) and superior segment left lower lobe (series 4/image 43) are stable. No new significant pulmonary nodules. Musculoskeletal: No aggressive appearing focal osseous lesions. Loop recorder device in the subcutaneous medial left ventral chest wall. Chronic severe T3 vertebral compression fracture. Vertebroplasty material noted within the T4 and T7 vertebral bodies, unchanged. Mild thoracic spondylosis. CT ABDOMEN PELVIS FINDINGS Hepatobiliary: Normal liver with no liver mass. Normal gallbladder with no radiopaque cholelithiasis. No biliary ductal dilatation. Pancreas: Normal, with no mass or duct dilation. Spleen: Normal size. No mass. Adrenals/Urinary Tract: Normal adrenals. Subcentimeter hypodense renal cortical lesions in the upper right kidney are too small to characterize and are unchanged. Otherwise normal kidneys, with no hydronephrosis. Normal nondistended bladder. Stomach/Bowel: Normal non-distended stomach. Normal caliber small bowel with no small bowel wall thickening. Normal appendix. Oral contrast transits to the colon. Normal large bowel with no diverticulosis, large bowel wall thickening or pericolonic fat stranding. Vascular/Lymphatic: Atherosclerotic nonaneurysmal abdominal aorta. Patent portal, splenic, hepatic and renal veins. No pathologically enlarged lymph nodes in the abdomen or pelvis. Reproductive: Grossly normal uterus.  No adnexal mass. Other:  No pneumoperitoneum, ascites or focal fluid collection. Musculoskeletal: No aggressive appearing focal osseous lesions. Faint posterior right L1 vertebral sclerosis is unchanged. Mild lumbar  spondylosis. IMPRESSION: 1. No evidence of active or progressive metastatic disease in the chest, abdomen or pelvis. Stable post treatment change at the right lung apex. Faint L1 vertebral sclerosis, chronic severe T3 vertebral compression fracture and vertebroplasty at T4 and T7, unchanged. 2. Aortic Atherosclerosis (ICD10-I70.0) and Emphysema (ICD10-J43.9). Electronically Signed   By: Ilona Sorrel M.D.   On: 04/09/2019 09:56     ASSESSMENT/PLAN:  This isa very pleasant 69 year old Caucasian female with metastatic non-small cell lung cancer who was initially diagnosed in October 2007. She is status post current chemoradiation followed by consolidation chemotherapy.  She is currently undergoing weeklymaintainancechemotherapy with single agent Alimta 500 mg/m. She is status post201cycles.She is tolerating treatment well without any concerning complaints except mild nausea/vomiting.  The patient recently had a restaging CT scan. Dr. Julien Nordmann personally and independently reviewed the scan and discussed the results with the patient. The scan did not show any evidence of disease progression. Recommend that she proceed with cycle #202 today as scheduled.   We will see her back for a follow up visit 3 weeks for evaluation before starting cycle #203.   She is requesting refills on her EMLA cream today for her port-a-cath. She is requesting a refill of her pain medication for her cancer related pain, and steroid cream for other skin lesions. Refills sent to pharmacy.   The patient was advised to call immediately if she has any concerning symptoms in the interval. The patient voices understanding of current disease status and treatment options and is in agreement with the current care plan. All questions were answered. The patient knows to call the clinic with any problems, questions or concerns. We can certainly see the patient much sooner if necessary      No orders of the defined types  were placed in this encounter.    Danielle Rajewski L Dairon Procter, PA-C 04/12/19  ADDENDUM: Hematology/Oncology Attending: I had a face-to-face encounter with the patient today.  I recommended her care plan.  This is a very pleasant 69 years old white female with metastatic non-small cell lung cancer, status post several chemotherapy regimens and she is currently on maintenance treatment with single agent Alimta status post 201 cycles.  The patient has been tolerating this treatment well with no concerning adverse effects. She had repeat CT scan of the chest, abdomen pelvis performed recently.  I personally and independently reviewed the scans and discussed the results with the patient today. Her scan showed no concerning findings for disease progression. I recommended for the patient to continue her current treatment with Alimta and she will proceed with cycle #202 today. For pain management she will continue her current pain medication with MS Contin and morphine sulfate. She will come back for follow-up visit in 3 weeks for evaluation before the next cycle of her treatment. The patient was advised to call immediately if she has any concerning symptoms in the interval.  Disclaimer: This note was dictated with voice recognition software. Similar sounding words can inadvertently be transcribed and may be missed upon review. Eilleen Kempf, MD 04/13/19

## 2019-04-13 ENCOUNTER — Inpatient Hospital Stay: Payer: Medicare Other

## 2019-04-13 ENCOUNTER — Encounter: Payer: Self-pay | Admitting: Physician Assistant

## 2019-04-13 ENCOUNTER — Inpatient Hospital Stay: Payer: Medicare Other | Attending: Physician Assistant

## 2019-04-13 ENCOUNTER — Inpatient Hospital Stay (HOSPITAL_BASED_OUTPATIENT_CLINIC_OR_DEPARTMENT_OTHER): Payer: Medicare Other | Admitting: Physician Assistant

## 2019-04-13 ENCOUNTER — Other Ambulatory Visit: Payer: Self-pay

## 2019-04-13 VITALS — BP 158/68 | HR 96 | Temp 98.7°F | Resp 20 | Ht 62.0 in | Wt 151.0 lb

## 2019-04-13 DIAGNOSIS — Z8673 Personal history of transient ischemic attack (TIA), and cerebral infarction without residual deficits: Secondary | ICD-10-CM | POA: Insufficient documentation

## 2019-04-13 DIAGNOSIS — Z923 Personal history of irradiation: Secondary | ICD-10-CM | POA: Insufficient documentation

## 2019-04-13 DIAGNOSIS — C7931 Secondary malignant neoplasm of brain: Secondary | ICD-10-CM

## 2019-04-13 DIAGNOSIS — I10 Essential (primary) hypertension: Secondary | ICD-10-CM | POA: Insufficient documentation

## 2019-04-13 DIAGNOSIS — Z7952 Long term (current) use of systemic steroids: Secondary | ICD-10-CM | POA: Diagnosis not present

## 2019-04-13 DIAGNOSIS — C3490 Malignant neoplasm of unspecified part of unspecified bronchus or lung: Secondary | ICD-10-CM | POA: Diagnosis not present

## 2019-04-13 DIAGNOSIS — C3411 Malignant neoplasm of upper lobe, right bronchus or lung: Secondary | ICD-10-CM

## 2019-04-13 DIAGNOSIS — Z79899 Other long term (current) drug therapy: Secondary | ICD-10-CM | POA: Diagnosis not present

## 2019-04-13 DIAGNOSIS — Z87891 Personal history of nicotine dependence: Secondary | ICD-10-CM | POA: Diagnosis not present

## 2019-04-13 DIAGNOSIS — G893 Neoplasm related pain (acute) (chronic): Secondary | ICD-10-CM | POA: Insufficient documentation

## 2019-04-13 DIAGNOSIS — Z9221 Personal history of antineoplastic chemotherapy: Secondary | ICD-10-CM | POA: Insufficient documentation

## 2019-04-13 DIAGNOSIS — Z7982 Long term (current) use of aspirin: Secondary | ICD-10-CM | POA: Diagnosis not present

## 2019-04-13 DIAGNOSIS — Z95828 Presence of other vascular implants and grafts: Secondary | ICD-10-CM

## 2019-04-13 DIAGNOSIS — J439 Emphysema, unspecified: Secondary | ICD-10-CM | POA: Insufficient documentation

## 2019-04-13 DIAGNOSIS — Z5111 Encounter for antineoplastic chemotherapy: Secondary | ICD-10-CM | POA: Insufficient documentation

## 2019-04-13 DIAGNOSIS — C3491 Malignant neoplasm of unspecified part of right bronchus or lung: Secondary | ICD-10-CM

## 2019-04-13 DIAGNOSIS — C3410 Malignant neoplasm of upper lobe, unspecified bronchus or lung: Secondary | ICD-10-CM

## 2019-04-13 LAB — CBC WITH DIFFERENTIAL/PLATELET
Abs Immature Granulocytes: 0.02 10*3/uL (ref 0.00–0.07)
Basophils Absolute: 0 10*3/uL (ref 0.0–0.1)
Basophils Relative: 0 %
Eosinophils Absolute: 0 10*3/uL (ref 0.0–0.5)
Eosinophils Relative: 0 %
HCT: 36 % (ref 36.0–46.0)
Hemoglobin: 11.5 g/dL — ABNORMAL LOW (ref 12.0–15.0)
Immature Granulocytes: 0 %
Lymphocytes Relative: 17 %
Lymphs Abs: 1.6 10*3/uL (ref 0.7–4.0)
MCH: 31.2 pg (ref 26.0–34.0)
MCHC: 31.9 g/dL (ref 30.0–36.0)
MCV: 97.6 fL (ref 80.0–100.0)
Monocytes Absolute: 1 10*3/uL (ref 0.1–1.0)
Monocytes Relative: 11 %
Neutro Abs: 6.5 10*3/uL (ref 1.7–7.7)
Neutrophils Relative %: 72 %
Platelets: 345 10*3/uL (ref 150–400)
RBC: 3.69 MIL/uL — ABNORMAL LOW (ref 3.87–5.11)
RDW: 13.5 % (ref 11.5–15.5)
WBC: 9.2 10*3/uL (ref 4.0–10.5)
nRBC: 0 % (ref 0.0–0.2)

## 2019-04-13 LAB — COMPREHENSIVE METABOLIC PANEL
ALT: 10 U/L (ref 0–44)
AST: 20 U/L (ref 15–41)
Albumin: 3.3 g/dL — ABNORMAL LOW (ref 3.5–5.0)
Alkaline Phosphatase: 75 U/L (ref 38–126)
Anion gap: 12 (ref 5–15)
BUN: 11 mg/dL (ref 8–23)
CO2: 23 mmol/L (ref 22–32)
Calcium: 9.4 mg/dL (ref 8.9–10.3)
Chloride: 107 mmol/L (ref 98–111)
Creatinine, Ser: 0.81 mg/dL (ref 0.44–1.00)
GFR calc Af Amer: >60 mL/min (ref >60)
GFR calc non Af Amer: >60 mL/min (ref >60)
Glucose, Bld: 128 mg/dL — ABNORMAL HIGH (ref 70–99)
Potassium: 4.3 mmol/L (ref 3.5–5.1)
Sodium: 142 mmol/L (ref 135–145)
Total Bilirubin: 0.4 mg/dL (ref 0.3–1.2)
Total Protein: 6.8 g/dL (ref 6.5–8.1)

## 2019-04-13 MED ORDER — SODIUM CHLORIDE 0.9 % IV SOLN
Freq: Once | INTRAVENOUS | Status: AC
  Filled 2019-04-13: qty 250

## 2019-04-13 MED ORDER — ONDANSETRON HCL 8 MG PO TABS
8.0000 mg | ORAL_TABLET | Freq: Once | ORAL | Status: AC
  Administered 2019-04-13: 8 mg via ORAL

## 2019-04-13 MED ORDER — MORPHINE SULFATE ER 60 MG PO TBCR: 60.0000 mg | EXTENDED_RELEASE_TABLET | Freq: Two times a day (BID) | ORAL | 0 refills | Status: DC

## 2019-04-13 MED ORDER — SODIUM CHLORIDE 0.9% FLUSH
10.0000 mL | INTRAVENOUS | Status: DC | PRN
  Administered 2019-04-13: 10 mL
  Filled 2019-04-13: qty 10

## 2019-04-13 MED ORDER — LIDOCAINE-PRILOCAINE 2.5-2.5 % EX CREA: TOPICAL_CREAM | Freq: Once | CUTANEOUS | 2 refills | Status: AC

## 2019-04-13 MED ORDER — SODIUM CHLORIDE 0.9% FLUSH
10.0000 mL | INTRAVENOUS | Status: DC | PRN
  Administered 2019-04-13: 09:00:00 10 mL via INTRAVENOUS
  Filled 2019-04-13: qty 10

## 2019-04-13 MED ORDER — SODIUM CHLORIDE 0.9 % IV SOLN
10.0000 mg | Freq: Once | INTRAVENOUS | Status: AC
  Administered 2019-04-13: 10 mg via INTRAVENOUS
  Filled 2019-04-13: qty 10

## 2019-04-13 MED ORDER — ONDANSETRON HCL 8 MG PO TABS
ORAL_TABLET | ORAL | Status: AC
  Filled 2019-04-13: qty 1

## 2019-04-13 MED ORDER — TRIAMCINOLONE ACETONIDE 0.025 % EX OINT: 1.0000 "application " | TOPICAL_OINTMENT | Freq: Two times a day (BID) | CUTANEOUS | 0 refills | Status: DC

## 2019-04-13 MED ORDER — HEPARIN SOD (PORK) LOCK FLUSH 100 UNIT/ML IV SOLN
500.0000 [IU] | Freq: Once | INTRAVENOUS | Status: AC | PRN
  Administered 2019-04-13: 500 [IU]
  Filled 2019-04-13: qty 5

## 2019-04-13 MED ORDER — SODIUM CHLORIDE 0.9 % IV SOLN
500.0000 mg/m2 | Freq: Once | INTRAVENOUS | Status: AC
  Administered 2019-04-13: 800 mg via INTRAVENOUS
  Filled 2019-04-13: qty 20

## 2019-04-13 NOTE — Patient Instructions (Signed)

## 2019-04-16 ENCOUNTER — Other Ambulatory Visit: Payer: Self-pay | Admitting: Internal Medicine

## 2019-04-20 ENCOUNTER — Telehealth: Payer: Self-pay

## 2019-04-20 DIAGNOSIS — E119 Type 2 diabetes mellitus without complications: Secondary | ICD-10-CM

## 2019-04-20 DIAGNOSIS — I152 Hypertension secondary to endocrine disorders: Secondary | ICD-10-CM

## 2019-04-20 DIAGNOSIS — E1159 Type 2 diabetes mellitus with other circulatory complications: Secondary | ICD-10-CM

## 2019-04-20 NOTE — Telephone Encounter (Signed)
Please sign referral

## 2019-04-20 NOTE — Telephone Encounter (Signed)
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

## 2019-04-23 ENCOUNTER — Ambulatory Visit: Payer: Medicare Other

## 2019-04-23 ENCOUNTER — Other Ambulatory Visit: Payer: Self-pay

## 2019-04-23 DIAGNOSIS — E785 Hyperlipidemia, unspecified: Secondary | ICD-10-CM

## 2019-04-23 DIAGNOSIS — G4700 Insomnia, unspecified: Secondary | ICD-10-CM

## 2019-04-23 DIAGNOSIS — E1169 Type 2 diabetes mellitus with other specified complication: Secondary | ICD-10-CM

## 2019-04-23 DIAGNOSIS — E1159 Type 2 diabetes mellitus with other circulatory complications: Secondary | ICD-10-CM

## 2019-04-23 DIAGNOSIS — C3490 Malignant neoplasm of unspecified part of unspecified bronchus or lung: Secondary | ICD-10-CM

## 2019-04-23 DIAGNOSIS — E119 Type 2 diabetes mellitus without complications: Secondary | ICD-10-CM

## 2019-04-23 NOTE — Patient Instructions (Addendum)
April 23, 2019  Dear Danielle [name],  It was a pleasure meeting you during our initial appointment on April 23, 2019. Below is a summary of the goals we discussed and components of chronic care management. Please contact me anytime with questions or concerns.   Visit Information  Goals Addressed            This Visit's Progress   . Pharmacy Care Plan       CARE PLAN ENTRY  Current Barriers:  . Chronic Disease Management support, education, and care coordination needs related to hypertension, hyperlipidemia  Pharmacist Clinical Goal(s):  Marland Kitchen Over the next 6 months, patient will work with PharmD and primary care provider to address the following goals: o Hypertension: Maintain blood pressure goal of less than 140/90 mmHg o Cholesterol: Maintain LDL cholesterol within goal of less than 70 mg/dL  Interventions: . Comprehensive medication review performed . Reviewed vaccine recommendations: Shingrix 2-dose series, COVID-19 vaccine  Patient Self Care Activities:  For the next 6 months until follow up visit:  . Monitor blood pressure weekly and contact office if persistent low blood pressure readings  . Adhere to heart healthy diet such as the DASH diet   Initial goal documentation       Danielle [name] was given information about Chronic Care Management services today including:  1. CCM service includes personalized support from designated clinical staff supervised by her physician, including individualized plan of care and coordination with other care providers 2. 24/7 contact phone numbers for assistance for urgent and routine care needs. 3. Standard insurance, coinsurance, copays and deductibles apply for chronic care management only during months in which we provide at least 20 minutes of these services. Most insurances cover these services at 100%, however patients may be responsible for any copay, coinsurance and/or deductible if applicable. This service may help you avoid the  need for more expensive face-to-face services. 4. Only one practitioner may furnish and bill the service in a calendar month. 5. The patient may stop CCM services at any time (effective at the end of the month) by phone call to the office staff.  Patient agreed to services and verbal consent obtained.   The patient verbalized understanding of instructions provided today and agreed to receive a mailed copy of patient instruction and/or educational materials. Telephone follow up appointment with pharmacy team member scheduled for: 10/25/19 at 9:00 AM (telephone) for 6 month medication review  Debbora Dus, PharmD Clinical Pharmacist [facility] [phone]  [place] stands for "Dietary Approaches to Stop Hypertension." The DASH eating plan is a healthy eating plan that has been shown to reduce high blood pressure (hypertension). It may also reduce your risk for type 2 diabetes, heart disease, and stroke. The DASH eating plan may also help with weight loss. What are tips for following this plan?  General guidelines  Avoid eating more than 2,300 mg (milligrams) of salt (sodium) a day. If you have hypertension, you may need to reduce your sodium intake to 1,500 mg a day.  Limit alcohol intake to no more than 1 drink a day for nonpregnant women and 2 drinks a day for men. One drink equals 12 oz of beer, 5 oz of wine, or 1 oz of hard liquor.  Work with your health care provider to maintain a healthy body weight or to lose weight. Ask what an ideal weight is for you.  Get at least 30 minutes of exercise that causes  your heart to beat faster (aerobic exercise) most days of the week. Activities may include walking, swimming, or biking.  Work with your health care provider or diet and nutrition specialist (dietitian) to adjust your eating plan to your individual calorie needs. Reading food labels   Check food labels for the amount of sodium per serving.  Choose foods with less than 5 percent of the Daily Value of sodium. Generally, foods with less than 300 mg of sodium per serving fit into this eating plan.  To find whole grains, look for the word "whole" as the first word in the ingredient list. Shopping  Buy products labeled as "low-sodium" or "no salt added."  Buy fresh foods. Avoid canned foods and premade or frozen meals. Cooking  Avoid adding salt when cooking. Use salt-free seasonings or herbs instead of table salt or sea salt. Check with your health care provider or pharmacist before using salt substitutes.  Do not fry foods. Cook foods using healthy methods such as baking, boiling, grilling, and broiling instead.  Cook with heart-healthy oils, such as olive, canola, soybean, or sunflower oil. Meal planning  Eat a balanced diet that includes: ? 5 or more servings of fruits and vegetables each day. At each meal, try to fill half of your plate with fruits and vegetables. ? Up to 6-8 servings of whole grains each day. ? Less than 6 oz of lean meat, poultry, or fish each day. A 3-oz serving of meat is about the same size as a deck of cards. One egg equals 1 oz. ? 2 servings of low-fat dairy each day. ? A serving of nuts, seeds, or beans 5 times each week. ? Heart-healthy fats. Healthy fats called Omega-3 fatty acids are found in foods such as flaxseeds and coldwater fish, like sardines, salmon, and mackerel.  Limit how much you eat of the following: ? Canned or prepackaged foods. ? Food that is high in trans fat, such as fried foods. ? Food that is high in saturated fat, such as fatty meat. ? Sweets, desserts, sugary drinks, and other foods with added sugar. ? Full-fat dairy products.  Do not salt foods before eating.  Try to eat at least 2 vegetarian meals each week.  Eat more home-cooked food and less restaurant, buffet, and fast food.  When eating at a restaurant, ask that your food be prepared with less salt or no salt,  if possible. What foods are recommended? The items listed may not be a complete list. Talk with your dietitian about what dietary choices are best for you. Grains Whole-grain or whole-wheat bread. Whole-grain or whole-wheat pasta. Brown rice. [place] Morrow. Bulgur. Whole-grain and low-sodium cereals. Pita bread. Low-fat, low-sodium crackers. Whole-wheat flour tortillas. Vegetables Fresh or frozen vegetables (raw, steamed, roasted, or grilled). Low-sodium or reduced-sodium tomato and vegetable juice. Low-sodium or reduced-sodium tomato sauce and tomato paste. Low-sodium or reduced-sodium canned vegetables. Fruits All fresh, dried, or frozen fruit. Canned fruit in natural juice (without added sugar). Meat and other protein foods Skinless chicken or [place]. Ground chicken or [place]. Pork with fat trimmed off. Fish and seafood. Egg whites. Dried beans, peas, or lentils. Unsalted nuts, nut butters, and seeds. Unsalted canned beans. Lean cuts of beef with fat trimmed off. Low-sodium, lean deli meat. Dairy Low-fat (1%) or fat-free (skim) milk. Fat-free, low-fat, or reduced-fat cheeses. Nonfat, low-sodium ricotta or cottage cheese. Low-fat or nonfat yogurt. Low-fat, low-sodium cheese. Fats and oils Soft margarine without trans fats. Vegetable oil. Low-fat, reduced-fat, or light mayonnaise  and salad dressings (reduced-sodium). Canola, safflower, olive, soybean, and sunflower oils. Avocado. Seasoning and other foods Herbs. Spices. Seasoning mixes without salt. Unsalted popcorn and pretzels. Fat-free sweets. What foods are not recommended? The items listed may not be a complete list. Talk with your dietitian about what dietary choices are best for you. Grains Baked goods made with fat, such as croissants, muffins, or some breads. Dry pasta or rice meal packs. Vegetables Creamed or fried vegetables. Vegetables in a cheese sauce. Regular canned vegetables (not low-sodium or reduced-sodium). Regular canned  tomato sauce and paste (not low-sodium or reduced-sodium). Regular tomato and vegetable juice (not low-sodium or reduced-sodium). Angie Fava. Olives. Fruits Canned fruit in a light or heavy syrup. Fried fruit. Fruit in cream or butter sauce. Meat and other protein foods Fatty cuts of meat. Ribs. Fried meat. Berniece Salines. Sausage. Bologna and other processed lunch meats. Salami. Fatback. Hotdogs. Bratwurst. Salted nuts and seeds. Canned beans with added salt. Canned or smoked fish. Whole eggs or egg yolks. Chicken or [place] with skin. Dairy Whole or 2% milk, cream, and half-and-half. Whole or full-fat cream cheese. Whole-fat or sweetened yogurt. Full-fat cheese. Nondairy creamers. Whipped toppings. Processed cheese and cheese spreads. Fats and oils Butter. Stick margarine. Lard. Shortening. Ghee. Bacon fat. Tropical oils, such as coconut, palm kernel, or palm oil. Seasoning and other foods Salted popcorn and pretzels. Onion salt, garlic salt, seasoned salt, table salt, and sea salt. Worcestershire sauce. Tartar sauce. Barbecue sauce. Teriyaki sauce. Soy sauce, including reduced-sodium. Steak sauce. Canned and packaged gravies. Fish sauce. Oyster sauce. Cocktail sauce. Horseradish that you find on the shelf. Ketchup. Mustard. Meat flavorings and tenderizers. Bouillon cubes. Hot sauce and Tabasco sauce. Premade or packaged marinades. Premade or packaged taco seasonings. Relishes. Regular salad dressings. Where to find more information:  National Heart, Lung, and [place] [facility]: https://wilson-eaton.com/  American Heart Association: www.heart.org Summary  The DASH eating plan is a healthy eating plan that has been shown to reduce high blood pressure (hypertension). It may also reduce your risk for type 2 diabetes, heart disease, and stroke.  With the DASH eating plan, you should limit salt (sodium) intake to 2,300 mg a day. If you have hypertension, you may need to reduce your sodium intake to 1,500 mg a day.  When  on the DASH eating plan, aim to eat more fresh fruits and vegetables, whole grains, lean proteins, low-fat dairy, and heart-healthy fats.  Work with your health care provider or diet and nutrition specialist (dietitian) to adjust your eating plan to your individual calorie needs. This information is not intended to replace advice given to you by your health care provider. Make sure you discuss any questions you have with your health care provider. Document Revised: 12/06/2016 Document Reviewed: 12/18/2015 Elsevier Patient Education  2020 [employer].

## 2019-04-23 NOTE — Chronic Care Management (AMB) (Signed)
Chronic Care Management Pharmacy  Name: MARCENE LASKOWSKI  MRN: [id number] DOB: 1950-06-27  Chief Complaint/ HPI  Danielle [name],  69 y.o., female presents for their Initial CCM visit with the clinical pharmacist via telephone.  PCP : [name], MD   Their chronic conditions include: hypertension, hyperlipidemia, diabetes, metastatic lung cancer   Patient concerns: denies medication concerns   Office Visits:  02/26/19: Diona Browner - decline in GFR over past month, no new medications except Advil and Tylenol for surgery, restart Boost and increase liquids, avoids NSAIDs, BP low today, increase fluids, DM controlled, set up annual eye exam   Consult Visit:  04/13/19: Oncology - Metastatic non-small cell lung cancer, Alimta maintenance chemotherapy, Zofran PRN nausea   Allergies  Allergen Reactions  . Contrast Media [Iodinated Diagnostic Agents] Hives and Other (See Comments)    No reaction provided.  . Iohexol Hives     pt needs 13 hour prep--prev hives; added allergy 06/02/07 slg   . Sulfa Drugs Cross Reactors Hives  . Co-Trimoxazole Injection [Sulfamethoxazole-Trimethoprim] Rash    Medications: Outpatient Encounter Medications as of 04/23/2019  Medication Sig  . amLODipine (NORVASC) 10 MG tablet TAKE 1 TABLET (10 MG TOTAL) BY MOUTH DAILY.  Marland Kitchen aspirin 325 MG EC tablet TAKE 1 TABLET BY MOUTH EVERY DAY  . atorvastatin (LIPITOR) 40 MG tablet TAKE 1 TABLET BY MOUTH EVERY DAY AT 6 PM  . diphenhydrAMINE (BENADRYL) 25 MG tablet Take 1 tablet (25 mg total) by mouth as directed. 2 hours before CT scan.  . FeFum-FePoly-FA-B Cmp-C-Biot (INTEGRA PLUS) CAPS TAKE 1 CAPSULE BY MOUTH EVERY DAY IN THE MORNING  . fluticasone (FLONASE) 50 MCG/ACT nasal spray Place 1-2 sprays into both nostrils daily as needed.  . folic acid (FOLVITE) 1 MG tablet TAKE 1 TABLET BY MOUTH EVERY DAY  . furosemide (LASIX) 20 MG tablet TAKE 1 TABLET BY MOUTH EVERY DAY AS NEEDED FOR SWELLING  . lisinopril (ZESTRIL)  20 MG tablet TAKE 2 TABLETS BY MOUTH EVERY DAY  . morphine (MS CONTIN) 60 MG 12 hr tablet Take 1 tablet (60 mg total) by mouth 2 (two) times daily.  Marland Kitchen morphine (MSIR) 30 MG tablet Take 1 tablet (30 mg total) by mouth every 4 (four) hours as needed (breakthrough pain).  . Olopatadine HCl 0.2 % SOLN INSTILL 1 DROP INTO AFFECTED EYE(S) EVERY DAY  . ondansetron (ZOFRAN) 8 MG tablet TAKE 1 TABLET BY MOUTH EVERY 8 HOURS AS NEEDED FOR NAUSEA AND VOMITING  . predniSONE (DELTASONE) 50 MG tablet 1 tablet by mouth 13 hours ,7 hours and 1 hour  before CT scan  . triamcinolone (KENALOG) 0.025 % ointment Apply 1 application topically 2 (two) times daily.   No facility-administered encounter medications on file as of 04/23/2019.   Current Diagnosis/Assessment: Goals    . Increase physical activity     Starting 07/27/15, I will attempt to walk for at least 30 min daily as tolerated.     . Patient Stated     Starting 08/05/2017, I will continue to take medications as prescribed.     . Pharmacy Care Plan     CARE PLAN ENTRY  Current Barriers:  . Chronic Disease Management support, education, and care coordination needs related to hypertension, hyperlipidemia  Pharmacist Clinical Goal(s):  Marland Kitchen Over the next 6 months, patient will work with PharmD and primary care provider to address the following goals: o Hypertension: Maintain blood pressure goal of less than 140/90 mmHg o Cholesterol: Maintain LDL  cholesterol within goal of less than 70 mg/dL  Interventions: . Comprehensive medication review performed . Reviewed vaccine recommendations: Shingrix 2-dose series, COVID-19 vaccine  Patient Self Care Activities:  For the next 6 months until follow up visit:  . Monitor blood pressure weekly and contact office if persistent low blood pressure readings  . Adhere to heart healthy diet such as the DASH diet   Initial goal documentation      Hypertension   CMP Latest Ref Rng & Units 04/13/2019 03/23/2019  03/02/2019  Glucose 70 - 99 mg/dL 128(H) 167(H) 164(H)  BUN 8 - 23 mg/dL '11 12 12  ' Creatinine 0.44 - 1.00 mg/dL 0.81 1.01(H) 0.84  Sodium 135 - 145 mmol/L 142 140 140  Potassium 3.5 - 5.1 mmol/L 4.3 4.5 3.9  Chloride 98 - 111 mmol/L 107 105 105  CO2 22 - 32 mmol/L '23 24 27  ' Calcium 8.9 - 10.3 mg/dL 9.4 9.3 9.2  Total Protein 6.5 - 8.1 g/dL 6.8 6.9 6.7  Total Bilirubin 0.3 - 1.2 mg/dL 0.4 0.4 0.4  Alkaline Phos 38 - 126 U/L 75 83 74  AST 15 - 41 U/L '20 23 22  ' ALT 0 - 44 U/L '10 10 13   ' Office blood pressures are: BP Readings from Last 3 Encounters:  04/13/19 (!) 158/68  03/23/19 137/66  03/02/19 (!) 141/60   BP goal < 140/90 mmHg Patient has failed these meds in the past: none  Patient checks BP at home 1-2x per week Patient home BP readings are ranging: 142/52 mmHg, 86 bpm (this morning), denies any hypotension   Patient is currently controlled on the following medications:   Amlodipine 10 mg - 1 tablet daily  Lisinopril 20 mg - 2 tablets daily  Furosemide 20 mg - 1 tablet PRN swelling   We discussed: hasn't needed furosemide in a while, denies swelling   Plan: Continue current medications  Hyperlipidemia   Lipid Panel     Component Value Date/Time   CHOL 122 02/19/2019 0733   TRIG 107.0 02/19/2019 0733   HDL 41.00 02/19/2019 0733   CHOLHDL 3 02/19/2019 0733   VLDL 21.4 02/19/2019 0733   LDLCALC 59 02/19/2019 0733   LDLDIRECT 105.5 08/06/2013 0750    LDL goal < 70 (CVA) Patient has failed these meds in past: none  Patient is currently controlled on the following medications:   Atorvastatin 40 mg - 1 tablet daily  Aspirin 325 mg - 1 tablet daily (per neurology)  We discussed: sees neurology annually   Plan: Continue current medications  Diabetes   Recent Relevant Labs: Lab Results  Component Value Date/Time   HGBA1C 5.9 02/19/2019 07:33 AM   HGBA1C 5.8 08/20/2018 09:15 AM   EGFR 52 (L) 12/24/2016 08:16 AM   EGFR 57 (L) 12/03/2016 07:37 AM    MICROALBUR 1.1 01/17/2010 08:44 AM    Checking BG: none  Patient has failed these meds in past: none  Patient is currently controlled on the following medications:   No pharmacotherapy  Last diabetic eye exam:  Lab Results  Component Value Date/Time   HMDIABEYEEXA No Retinopathy 05/24/2015 12:00 AM    Last diabetic foot exam:  Lab Results  Component Value Date/Time   HMDIABFOOTEX done 08/28/2018 12:00 AM    We discussed: eye appt scheduled for May 06, 2019  Plan: Continue control with diet and exercise   Chronic Pain   Patient has failed these meds in past: none reported Patient is currently controlled on the following medications:  Morphine 60 mg 12 hr - 1 tablet BID   Morphine 30 mg - 1 tablet q4h PRN pain   We discussed: taking morphine 12 hr BID, limits short acting dose due to nausea/upset stomach; denies adverse effects   Plan: Continue current medications  Vaccines   Reviewed and discussed patient's vaccination history.    Immunization History  Administered Date(s) Administered  . Influenza Split 10/01/2011  . Influenza Whole 11/08/2007, 12/07/2008  . Influenza,inj,Quad PF,6+ Mos 10/13/2012, 09/14/2013, 09/27/2014, 10/01/2016, 09/23/2017, 09/15/2018  . Pneumococcal Conjugate-13 08/28/2018  . Pneumococcal Polysaccharide-23 10/16/2005, 02/12/2011  . Td 01/07/2002, 01/08/2008   Plan: Recommended patient receive Shingrix (denies interest at this time due to concern for adverse effects); Plans to get COVID-19 vaccine soon.   SDOH:   Emergency planning/management officer Strain: Low Risk   . Difficulty of Paying Living Expenses: Not hard at all   Medication Management  Misc: triamcinolone 0.025% ointment - BID, Zofran 8 mg - q8h PRN, olopatadine hcl 0.2% - 1 drop into affected eye daily - 1 drop in each eye daily   OTCs: folic acid 1 mg daily, flonase 50 mcg PRN, Integra plus daily   Pharmacy/Benefits: Medicare Part D ([place]  Adherence: confirms, denies  missed doses  Social support: lives with family, very supportive  Affordability: no concerns   CCM Follow Up: 10/25/19 at 9:00 AM (telephone) for 6 month medication review  Debbora Dus, PharmD Clinical Pharmacist [facility] at [facility] [phone]

## 2019-04-27 NOTE — Progress Notes (Signed)
Pharmacist Chemotherapy Monitoring - Follow Up Assessment    I verify that I have reviewed each item in the below checklist:  . Regimen for the patient is scheduled for the appropriate day and plan matches scheduled date. Marland Kitchen Appropriate non-routine labs are ordered dependent on drug ordered. . If applicable, additional medications reviewed and ordered per protocol based on lifetime cumulative doses and/or treatment regimen.   Plan for follow-up and/or issues identified: No . I-vent associated with next due treatment: No . MD and/or nursing notified: No  Danielle [name] 04/27/2019 10:11 AM

## 2019-05-03 ENCOUNTER — Other Ambulatory Visit: Payer: Self-pay

## 2019-05-03 ENCOUNTER — Inpatient Hospital Stay: Payer: Medicare Other

## 2019-05-03 ENCOUNTER — Telehealth: Payer: Self-pay | Admitting: Medical Oncology

## 2019-05-03 ENCOUNTER — Telehealth: Payer: Self-pay | Admitting: Physician Assistant

## 2019-05-03 ENCOUNTER — Telehealth: Payer: Self-pay

## 2019-05-03 ENCOUNTER — Inpatient Hospital Stay (HOSPITAL_BASED_OUTPATIENT_CLINIC_OR_DEPARTMENT_OTHER): Payer: Medicare Other | Admitting: Physician Assistant

## 2019-05-03 ENCOUNTER — Inpatient Hospital Stay: Payer: Medicare Other | Admitting: Internal Medicine

## 2019-05-03 VITALS — BP 150/65 | HR 88 | Temp 99.1°F | Resp 20 | Ht 62.0 in | Wt 148.8 lb

## 2019-05-03 DIAGNOSIS — Z5111 Encounter for antineoplastic chemotherapy: Secondary | ICD-10-CM

## 2019-05-03 DIAGNOSIS — C3411 Malignant neoplasm of upper lobe, right bronchus or lung: Secondary | ICD-10-CM

## 2019-05-03 DIAGNOSIS — Z87891 Personal history of nicotine dependence: Secondary | ICD-10-CM | POA: Diagnosis not present

## 2019-05-03 DIAGNOSIS — G893 Neoplasm related pain (acute) (chronic): Secondary | ICD-10-CM | POA: Diagnosis not present

## 2019-05-03 DIAGNOSIS — C7931 Secondary malignant neoplasm of brain: Secondary | ICD-10-CM | POA: Diagnosis not present

## 2019-05-03 DIAGNOSIS — I10 Essential (primary) hypertension: Secondary | ICD-10-CM | POA: Diagnosis not present

## 2019-05-03 LAB — COMPREHENSIVE METABOLIC PANEL
ALT: 12 U/L (ref 0–44)
AST: 22 U/L (ref 15–41)
Albumin: 3.3 g/dL — ABNORMAL LOW (ref 3.5–5.0)
Alkaline Phosphatase: 79 U/L (ref 38–126)
Anion gap: 7 (ref 5–15)
BUN: 8 mg/dL (ref 8–23)
CO2: 27 mmol/L (ref 22–32)
Calcium: 9.4 mg/dL (ref 8.9–10.3)
Chloride: 106 mmol/L (ref 98–111)
Creatinine, Ser: 0.79 mg/dL (ref 0.44–1.00)
GFR calc Af Amer: >60 mL/min (ref >60)
GFR calc non Af Amer: >60 mL/min (ref >60)
Glucose, Bld: 85 mg/dL (ref 70–99)
Potassium: 4.4 mmol/L (ref 3.5–5.1)
Sodium: 140 mmol/L (ref 135–145)
Total Bilirubin: 0.4 mg/dL (ref 0.3–1.2)
Total Protein: 6.9 g/dL (ref 6.5–8.1)

## 2019-05-03 LAB — CBC WITH DIFFERENTIAL/PLATELET
Abs Immature Granulocytes: 0.03 10*3/uL (ref 0.00–0.07)
Basophils Absolute: 0 10*3/uL (ref 0.0–0.1)
Basophils Relative: 0 %
Eosinophils Absolute: 0 10*3/uL (ref 0.0–0.5)
Eosinophils Relative: 1 %
HCT: 37.8 % (ref 36.0–46.0)
Hemoglobin: 12.1 g/dL (ref 12.0–15.0)
Immature Granulocytes: 0 %
Lymphocytes Relative: 26 %
Lymphs Abs: 2.1 10*3/uL (ref 0.7–4.0)
MCH: 31.7 pg (ref 26.0–34.0)
MCHC: 32 g/dL (ref 30.0–36.0)
MCV: 99 fL (ref 80.0–100.0)
Monocytes Absolute: 0.9 10*3/uL (ref 0.1–1.0)
Monocytes Relative: 12 %
Neutro Abs: 4.8 10*3/uL (ref 1.7–7.7)
Neutrophils Relative %: 61 %
Platelets: 372 10*3/uL (ref 150–400)
RBC: 3.82 MIL/uL — ABNORMAL LOW (ref 3.87–5.11)
RDW: 14 % (ref 11.5–15.5)
WBC: 7.9 10*3/uL (ref 4.0–10.5)
nRBC: 0 % (ref 0.0–0.2)

## 2019-05-03 MED ORDER — ONDANSETRON HCL 8 MG PO TABS
8.0000 mg | ORAL_TABLET | Freq: Once | ORAL | Status: AC
  Administered 2019-05-03: 8 mg via ORAL

## 2019-05-03 MED ORDER — SODIUM CHLORIDE 0.9% FLUSH
10.0000 mL | INTRAVENOUS | Status: DC | PRN
  Administered 2019-05-03: 10 mL
  Filled 2019-05-03: qty 10

## 2019-05-03 MED ORDER — HEPARIN SOD (PORK) LOCK FLUSH 100 UNIT/ML IV SOLN
500.0000 [IU] | Freq: Once | INTRAVENOUS | Status: AC | PRN
  Administered 2019-05-03: 500 [IU]
  Filled 2019-05-03: qty 5

## 2019-05-03 MED ORDER — SODIUM CHLORIDE 0.9 % IV SOLN
500.0000 mg/m2 | Freq: Once | INTRAVENOUS | Status: AC
  Administered 2019-05-03: 800 mg via INTRAVENOUS
  Filled 2019-05-03: qty 20

## 2019-05-03 MED ORDER — ONDANSETRON HCL 8 MG PO TABS
ORAL_TABLET | ORAL | Status: AC
  Filled 2019-05-03: qty 1

## 2019-05-03 MED ORDER — SODIUM CHLORIDE 0.9 % IV SOLN
Freq: Once | INTRAVENOUS | Status: AC
  Filled 2019-05-03: qty 250

## 2019-05-03 MED ORDER — SODIUM CHLORIDE 0.9 % IV SOLN
10.0000 mg | Freq: Once | INTRAVENOUS | Status: AC
  Administered 2019-05-03: 10 mg via INTRAVENOUS
  Filled 2019-05-03: qty 10

## 2019-05-03 NOTE — Progress Notes (Signed)
[place] OFFICE PROGRESS NOTE  Danielle Sanders, [place] [id number]  DIAGNOSIS: Metastatic non-small cell lung cancer initially diagnosed as locally advanced stage IIIB with a right Pancoast tumor involving the vertebral body as well as prominent canal invasion with spinal cord compression in October 2007. The patient also has metastatic disease to the brain in April 2008.  PRIOR THERAPY:  1. Status post concurrent chemoradiation with weekly carboplatin and paclitaxel, last dose was given November 18, 2005. 2. 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 3. Status post gamma knife radiotherapy to a solitary brain lesion located in the superior frontal area of the brain at [facility] in April of 2008. 4. Status post palliative radiotherapy to the lateral abdominal wall metastatic lesion under the care of Dr. Lisbeth Renshaw, completed March of 2009. 5. Status post 6 cycles of systemic chemotherapy with carboplatin and Alimta. Last dose was given July 26, 2007 with disease stabilization. 6. Gamma knife stereotactic radiotherapy to 2 brain lesions one involving the right frontal dural based as well as right parietal lesion performed on 05/07/2012 under the care of Dr. Vallarie Mare at [facility].  CURRENT THERAPY: Maintenance systemic chemotherapy with Alimta 500 MG/M2 every 3 weeks, status post202cycles.  INTERVAL HISTORY: Danielle [name] 68 y.o. female returns to the clinic for a follow up visit.The patient is feeling well today without any concerning complaints. She is tolerating her treatment with maintenance Alimta well without any adverse side effects except for occasional nausea/vomiting, usually occurring a few time a week. She takes zofran for nausea.She denies any fever, chills, or significantweight loss. She reportedly has bad baseline night sweats for several years,  which are unchanged. She denies any hemoptysis, cough,chest pain. She reports her baseline shortness of breath with exertion.She denies any diarrhea or constipation. She denies any headaches or visual changes. She is here today for evaluation before starting cycle #203.   MEDICAL HISTORY: Past Medical History:  Diagnosis Date  . Anemia   . 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 Carcinomas, basal cell 01/22/2019   two places on face  . CVA (cerebral vascular accident) ([place]) 04/2014   pt states had 2 cva's within 2 wks  . DJD (degenerative joint disease), cervical   . Fibromyalgia   . H/O: pneumonia   . History of tobacco abuse quit 10/08   on nicotine patch  . Hypertension   . Hypokalemia   . Lung cancer ([place]) dx'd 09/2005   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 had chemo and radiation for lung ca  . Thrush 12/11/2010    ALLERGIES:  is allergic to contrast media [iodinated diagnostic agents]; iohexol; sulfa drugs cross reactors; and co-trimoxazole injection [sulfamethoxazole-trimethoprim].  MEDICATIONS:  Current Outpatient Medications  Medication Sig Dispense Refill  . amLODipine (NORVASC) 10 MG tablet TAKE 1 TABLET (10 MG TOTAL) BY MOUTH DAILY. 90 tablet 3  . aspirin 325 MG EC tablet TAKE 1 TABLET BY MOUTH EVERY DAY 90 tablet 0  . atorvastatin (LIPITOR) 40 MG tablet TAKE 1 TABLET BY MOUTH EVERY DAY AT 6 PM 90 tablet 3  . diphenhydrAMINE (BENADRYL) 25 MG tablet Take 1 tablet (25 mg total) by mouth as directed. 2 hours before CT scan. 5 tablet 0  . FeFum-FePoly-FA-B Cmp-C-Biot (INTEGRA PLUS) CAPS TAKE 1 CAPSULE BY MOUTH EVERY DAY IN THE MORNING 90 capsule 3  . fluticasone (FLONASE) 50 MCG/ACT nasal spray Place  1-2 sprays into both nostrils daily as needed. 16 g 5  . folic acid (FOLVITE) 1 MG tablet TAKE 1 TABLET BY MOUTH EVERY DAY 90 tablet 1  . furosemide (LASIX) 20 MG tablet TAKE 1 TABLET BY MOUTH EVERY DAY AS NEEDED FOR SWELLING 20 tablet 1  . lisinopril  (ZESTRIL) 20 MG tablet TAKE 2 TABLETS BY MOUTH EVERY DAY 180 tablet 3  . morphine (MS CONTIN) 60 MG 12 hr tablet Take 1 tablet (60 mg total) by mouth 2 (two) times daily. 60 tablet 0  . morphine (MSIR) 30 MG tablet Take 1 tablet (30 mg total) by mouth every 4 (four) hours as needed (breakthrough pain). 60 tablet 0  . Olopatadine HCl 0.2 % SOLN INSTILL 1 DROP INTO AFFECTED EYE(S) EVERY DAY 2.5 mL 1  . ondansetron (ZOFRAN) 8 MG tablet TAKE 1 TABLET BY MOUTH EVERY 8 HOURS AS NEEDED FOR NAUSEA AND VOMITING 30 tablet 2  . predniSONE (DELTASONE) 50 MG tablet 1 tablet by mouth 13 hours ,7 hours and 1 hour  before CT scan 3 tablet 4  . triamcinolone (KENALOG) 0.025 % ointment Apply 1 application topically 2 (two) times daily. 30 g 0   No current facility-administered medications for this visit.    SURGICAL HISTORY:  Past Surgical History:  Procedure Laterality Date  . CERVICAL LAMINECTOMY  1995  . KNEE SURGERY  1990   Left x 2  . 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 LOOP RECORDER IMPLANT N/A 04/19/2014   Procedure: LOOP RECORDER IMPLANT;  Surgeon: Thompson Grayer, MD;  Location: [facility] CATH LAB;  Service: Cardiovascular;  Laterality: N/A;  . OTHER SURGICAL HISTORY  2008   Gamma knife surgery to remove brain met   . PORTACATH PLACEMENT  -[name]   TIP IN CAVOATRIAL JUNCTION  . TEE WITHOUT CARDIOVERSION N/A 04/19/2014   Procedure: TRANSESOPHAGEAL ECHOCARDIOGRAM (TEE);  Surgeon: Larey Dresser, MD;  Location: [facility];  Service: Cardiovascular;  Laterality: N/A;    REVIEW OF SYSTEMS:   Review of Systems Constitutional: Negative for appetite change, chills, fatigue, fever and unexpected weight change.  HENT:Negative for mouth sores, nosebleeds, sore throat and trouble swallowing.  Eyes: Negative for eye problems and icterus.  Respiratory:Positive for baseline shortness of breath with exertion. Negative for cough, hemoptysis, and wheezing.  Cardiovascular:  Negative for chest pain and leg swelling.  Gastrointestinal:Positive for occasional nausea and vomiting.Negative for abdominal pain, constipation, and diarrhea. Genitourinary: Negative for bladder incontinence, difficulty urinating, dysuria, frequency and hematuria.  Musculoskeletal: Negative for back pain, gait problem, neck pain and neck stiffness.  Skin:Positive for several scars on her skin.Negative for itching and rash.  Neurological: Negative for dizziness, extremity weakness, gait problem, headaches, light-headedness and seizures.  Hematological: Negative for adenopathy. Does not bruise/bleed easily.  Psychiatric/Behavioral: Negative for confusion, depression and sleep disturbance. The patient is not nervous/anxious.     PHYSICAL EXAMINATION:  Blood pressure (!) 150/65, pulse 88, temperature 99.1 F (37.3 C), temperature source Temporal, resp. rate 20, height _0  (1.575 m), weight 148 lb 12.8 oz (67.5 kg), SpO2 96 %.  ECOG PERFORMANCE STATUS: 1 - Symptomatic but completely ambulatory  Physical Exam  Constitutional: Oriented to person, place, and time and well-developed, well-nourished, and in no distress.   HENT:  Head: Normocephalic and atraumatic.  Mouth/Throat: Oropharynx is clear and moist. No oropharyngeal exudate.  Eyes: Conjunctivae are normal. Right eye exhibits no discharge. Left eye exhibits no discharge. No scleral  icterus.  Neck: Normal range of motion. Neck supple.  Cardiovascular: Normal rate, regular rhythm, normal heart sounds and intact distal pulses.   Pulmonary/Chest: Effort normal and breath sounds quiet in all lung fields. No respiratory distress. No wheezes. No rales.  Abdominal: Soft. Bowel sounds are normal. Exhibits no distension and no mass. There is no tenderness.  Musculoskeletal: Normal range of motion. Exhibits no edema.  Lymphadenopathy:    No cervical adenopathy.  Neurological: Alert and oriented to person, place, and time. Exhibits normal  muscle tone. Gait normal. Coordination normal.  Skin: Skin is warm and dry. No rash noted. Not diaphoretic. No erythema. No pallor.  Psychiatric: Mood, memory and judgment normal.  Vitals reviewed.  LABORATORY DATA: Lab Results  Component Value Date   WBC 7.9 05/03/2019   HGB 12.1 05/03/2019   HCT 37.8 05/03/2019   MCV 99.0 05/03/2019   PLT 372 05/03/2019      Chemistry      Component Value Date/Time   NA 142 04/13/2019 0835   NA 142 12/24/2016 0816   K 4.3 04/13/2019 0835   K 4.4 12/24/2016 0816   CL 107 04/13/2019 0835   CL 106 06/30/2012 1004   CO2 23 04/13/2019 0835   CO2 28 12/24/2016 0816   BUN 11 04/13/2019 0835   BUN 15.8 12/24/2016 0816   CREATININE 0.81 04/13/2019 0835   CREATININE 0.87 07/22/2017 0826   CREATININE 1.1 12/24/2016 0816      Component Value Date/Time   CALCIUM 9.4 04/13/2019 0835   CALCIUM 9.6 12/24/2016 0816   ALKPHOS 75 04/13/2019 0835   ALKPHOS 80 12/24/2016 0816   AST 20 04/13/2019 0835   AST 20 07/22/2017 0826   AST 20 12/24/2016 0816   ALT 10 04/13/2019 0835   ALT 11 07/22/2017 0826   ALT 11 12/24/2016 0816   BILITOT 0.4 04/13/2019 0835   BILITOT 0.4 07/22/2017 0826   BILITOT 0.34 12/24/2016 0816       RADIOGRAPHIC STUDIES:  CT Chest W Contrast  Result Date: 04/09/2019 CLINICAL DATA:  Stage IV lung cancer with ongoing chemotherapy. Former smoker. Restaging. EXAM: CT CHEST, ABDOMEN, AND PELVIS WITH CONTRAST TECHNIQUE: Multidetector CT imaging of the chest, abdomen and pelvis was performed following the standard protocol during bolus administration of intravenous contrast. CONTRAST:  171m OMNIPAQUE IOHEXOL 300 MG/ML  SOLN COMPARISON:  10/02/2018 CT chest, abdomen and pelvis. FINDINGS: CT CHEST FINDINGS Cardiovascular: Normal heart size. No significant pericardial effusion/thickening. Left anterior descending and left circumflex coronary atherosclerosis. Right internal jugular Port-A-Cath terminates at the cavoatrial junction.  Atherosclerotic nonaneurysmal thoracic aorta. Top-normal caliber main pulmonary artery (3.1 cm diameter). No central pulmonary emboli. Mediastinum/Nodes: No discrete thyroid nodules. Unremarkable esophagus. No pathologically enlarged axillary, mediastinal or hilar lymph nodes. Lungs/Pleura: No pneumothorax. No pleural effusion. Moderate centrilobular and paraseptal emphysema with diffuse bronchial wall thickening. No acute consolidative airspace disease or lung masses. Sharply marginated patchy reticulation in medial apical right upper lobe is unchanged. Stable right apical pleural thickening and calcification. Tiny solid 3 mm pulmonary nodules in the posterior right upper lobe (series 4/image 36) and superior segment left lower lobe (series 4/image 43) are stable. No new significant pulmonary nodules. Musculoskeletal: No aggressive appearing focal osseous lesions. Loop recorder device in the subcutaneous medial left ventral chest wall. Chronic severe T3 vertebral compression fracture. Vertebroplasty material noted within the T4 and T7 vertebral bodies, unchanged. Mild thoracic spondylosis. CT ABDOMEN PELVIS FINDINGS Hepatobiliary: Normal liver with no liver mass. Normal gallbladder with no radiopaque  cholelithiasis. No biliary ductal dilatation. Pancreas: Normal, with no mass or duct dilation. Spleen: Normal size. No mass. Adrenals/Urinary Tract: Normal adrenals. Subcentimeter hypodense renal cortical lesions in the upper right kidney are too small to characterize and are unchanged. Otherwise normal kidneys, with no hydronephrosis. Normal nondistended bladder. Stomach/Bowel: Normal non-distended stomach. Normal caliber small bowel with no small bowel wall thickening. Normal appendix. Oral contrast transits to the colon. Normal large bowel with no diverticulosis, large bowel wall thickening or pericolonic fat stranding. Vascular/Lymphatic: Atherosclerotic nonaneurysmal abdominal aorta. Patent portal, splenic,  hepatic and renal veins. No pathologically enlarged lymph nodes in the abdomen or pelvis. Reproductive: Grossly normal uterus.  No adnexal mass. Other: No pneumoperitoneum, ascites or focal fluid collection. Musculoskeletal: No aggressive appearing focal osseous lesions. Faint posterior right L1 vertebral sclerosis is unchanged. Mild lumbar spondylosis. IMPRESSION: 1. No evidence of active or progressive metastatic disease in the chest, abdomen or pelvis. Stable post treatment change at the right lung apex. Faint L1 vertebral sclerosis, chronic severe T3 vertebral compression fracture and vertebroplasty at T4 and T7, unchanged. 2. Aortic Atherosclerosis (ICD10-I70.0) and Emphysema (ICD10-J43.9). Electronically Signed   By: Ilona Sorrel M.D.   On: 04/09/2019 09:56   CT Abdomen Pelvis W Contrast  Result Date: 04/09/2019 CLINICAL DATA:  Stage IV lung cancer with ongoing chemotherapy. Former smoker. Restaging. EXAM: CT CHEST, ABDOMEN, AND PELVIS WITH CONTRAST TECHNIQUE: Multidetector CT imaging of the chest, abdomen and pelvis was performed following the standard protocol during bolus administration of intravenous contrast. CONTRAST:  146m OMNIPAQUE IOHEXOL 300 MG/ML  SOLN COMPARISON:  10/02/2018 CT chest, abdomen and pelvis. FINDINGS: CT CHEST FINDINGS Cardiovascular: Normal heart size. No significant pericardial effusion/thickening. Left anterior descending and left circumflex coronary atherosclerosis. Right internal jugular Port-A-Cath terminates at the cavoatrial junction. Atherosclerotic nonaneurysmal thoracic aorta. Top-normal caliber main pulmonary artery (3.1 cm diameter). No central pulmonary emboli. Mediastinum/Nodes: No discrete thyroid nodules. Unremarkable esophagus. No pathologically enlarged axillary, mediastinal or hilar lymph nodes. Lungs/Pleura: No pneumothorax. No pleural effusion. Moderate centrilobular and paraseptal emphysema with diffuse bronchial wall thickening. No acute consolidative  airspace disease or lung masses. Sharply marginated patchy reticulation in medial apical right upper lobe is unchanged. Stable right apical pleural thickening and calcification. Tiny solid 3 mm pulmonary nodules in the posterior right upper lobe (series 4/image 36) and superior segment left lower lobe (series 4/image 43) are stable. No new significant pulmonary nodules. Musculoskeletal: No aggressive appearing focal osseous lesions. Loop recorder device in the subcutaneous medial left ventral chest wall. Chronic severe T3 vertebral compression fracture. Vertebroplasty material noted within the T4 and T7 vertebral bodies, unchanged. Mild thoracic spondylosis. CT ABDOMEN PELVIS FINDINGS Hepatobiliary: Normal liver with no liver mass. Normal gallbladder with no radiopaque cholelithiasis. No biliary ductal dilatation. Pancreas: Normal, with no mass or duct dilation. Spleen: Normal size. No mass. Adrenals/Urinary Tract: Normal adrenals. Subcentimeter hypodense renal cortical lesions in the upper right kidney are too small to characterize and are unchanged. Otherwise normal kidneys, with no hydronephrosis. Normal nondistended bladder. Stomach/Bowel: Normal non-distended stomach. Normal caliber small bowel with no small bowel wall thickening. Normal appendix. Oral contrast transits to the colon. Normal large bowel with no diverticulosis, large bowel wall thickening or pericolonic fat stranding. Vascular/Lymphatic: Atherosclerotic nonaneurysmal abdominal aorta. Patent portal, splenic, hepatic and renal veins. No pathologically enlarged lymph nodes in the abdomen or pelvis. Reproductive: Grossly normal uterus.  No adnexal mass. Other: No pneumoperitoneum, ascites or focal fluid collection. Musculoskeletal: No aggressive appearing focal osseous lesions. Faint posterior right L1  vertebral sclerosis is unchanged. Mild lumbar spondylosis. IMPRESSION: 1. No evidence of active or progressive metastatic disease in the chest,  abdomen or pelvis. Stable post treatment change at the right lung apex. Faint L1 vertebral sclerosis, chronic severe T3 vertebral compression fracture and vertebroplasty at T4 and T7, unchanged. 2. Aortic Atherosclerosis (ICD10-I70.0) and Emphysema (ICD10-J43.9). Electronically Signed   By: Ilona Sorrel M.D.   On: 04/09/2019 09:56     ASSESSMENT/PLAN:  This isa very pleasant 69 year old Caucasian female with metastatic non-small cell lung cancer who was initially diagnosed in October 2007. She is status post current chemoradiation followed by consolidation chemotherapy.  She is currently undergoing weeklymaintainancechemotherapy with single agent Alimta 500 mg/m. She is status post202cycles.She is tolerating treatment well without any concerning complaints except mild nausea/vomiting.  Labs were reviewed. Recommend that she proceed with cycle #203 today as scheduled.   We will see her back for a follow up visit 3 weeks for evaluation before starting cycle #204.   The patient was advised to call immediately if she has any concerning symptoms in the interval. The patient voices understanding of current disease status and treatment options and is in agreement with the current care plan. All questions were answered. The patient knows to call the clinic with any problems, questions or concerns. We can certainly see the patient much sooner if necessary.  No orders of the defined types were placed in this encounter.    Travarus Trudo L Taariq Leitz, PA-C 05/03/19

## 2019-05-03 NOTE — Telephone Encounter (Signed)
Pt thought her appts were on tuesdays.Pt rescheduled for later today.

## 2019-05-03 NOTE — Patient Instructions (Signed)
[facility] Discharge Instructions for Patients Receiving Chemotherapy  Today you received the following chemotherapy agents: Alimta.  To help prevent nausea and vomiting after your treatment, we encourage you to take your nausea medication as directed.   If you develop nausea and vomiting that is not controlled by your nausea medication, call the clinic.   BELOW ARE SYMPTOMS THAT SHOULD BE REPORTED IMMEDIATELY:  *FEVER GREATER THAN 100.5 F  *CHILLS WITH OR WITHOUT FEVER  NAUSEA AND VOMITING THAT IS NOT CONTROLLED WITH YOUR NAUSEA MEDICATION  *UNUSUAL SHORTNESS OF BREATH  *UNUSUAL BRUISING OR BLEEDING  TENDERNESS IN MOUTH AND THROAT WITH OR WITHOUT PRESENCE OF ULCERS  *URINARY PROBLEMS  *BOWEL PROBLEMS  UNUSUAL RASH Items with * indicate a potential emergency and should be followed up as soon as possible.  Feel free to call the clinic should you have any questions or concerns. The clinic phone number is [phone].  Please show the [facility] at check-in to the Emergency Department and triage nurse.

## 2019-05-03 NOTE — Telephone Encounter (Signed)
Unable to reach patient about missed appts today - no answer

## 2019-05-03 NOTE — Telephone Encounter (Signed)
Scheduled per los. Gave avs and calendar  

## 2019-05-06 DIAGNOSIS — H524 Presbyopia: Secondary | ICD-10-CM | POA: Diagnosis not present

## 2019-05-12 ENCOUNTER — Other Ambulatory Visit: Payer: Self-pay | Admitting: Internal Medicine

## 2019-05-12 ENCOUNTER — Telehealth: Payer: Self-pay | Admitting: Medical Oncology

## 2019-05-12 DIAGNOSIS — C3410 Malignant neoplasm of upper lobe, unspecified bronchus or lung: Secondary | ICD-10-CM

## 2019-05-12 DIAGNOSIS — C3490 Malignant neoplasm of unspecified part of unspecified bronchus or lung: Secondary | ICD-10-CM

## 2019-05-12 MED ORDER — MORPHINE SULFATE 30 MG PO TABS: 30.0000 mg | ORAL_TABLET | ORAL | 0 refills | Status: DC | PRN

## 2019-05-12 MED ORDER — MORPHINE SULFATE ER 60 MG PO TBCR: 60.0000 mg | EXTENDED_RELEASE_TABLET | Freq: Two times a day (BID) | ORAL | 0 refills | Status: DC

## 2019-05-12 NOTE — Telephone Encounter (Signed)
Refill request for Narcotics.

## 2019-05-13 DIAGNOSIS — Z23 Encounter for immunization: Secondary | ICD-10-CM | POA: Diagnosis not present

## 2019-05-19 NOTE — Progress Notes (Signed)
Pharmacist Chemotherapy Monitoring - Follow Up Assessment    I verify that I have reviewed each item in the below checklist:  . Regimen for the patient is scheduled for the appropriate day and plan matches scheduled date. Marland Kitchen Appropriate non-routine labs are ordered dependent on drug ordered. . If applicable, additional medications reviewed and ordered per protocol based on lifetime cumulative doses and/or treatment regimen.   Plan for follow-up and/or issues identified: No . I-vent associated with next due treatment: No . MD and/or nursing notified: No  Philomena Course 05/19/2019 9:15 AM

## 2019-05-25 ENCOUNTER — Inpatient Hospital Stay: Payer: Medicare Other

## 2019-05-25 ENCOUNTER — Inpatient Hospital Stay: Payer: Medicare Other | Attending: Physician Assistant | Admitting: Internal Medicine

## 2019-05-25 ENCOUNTER — Other Ambulatory Visit: Payer: Self-pay

## 2019-05-25 ENCOUNTER — Encounter: Payer: Self-pay | Admitting: Internal Medicine

## 2019-05-25 VITALS — HR 96 | Resp 18

## 2019-05-25 VITALS — BP 144/62 | HR 102 | Temp 98.7°F | Resp 20 | Ht 62.0 in | Wt 147.8 lb

## 2019-05-25 DIAGNOSIS — R110 Nausea: Secondary | ICD-10-CM | POA: Insufficient documentation

## 2019-05-25 DIAGNOSIS — G893 Neoplasm related pain (acute) (chronic): Secondary | ICD-10-CM | POA: Diagnosis not present

## 2019-05-25 DIAGNOSIS — Z923 Personal history of irradiation: Secondary | ICD-10-CM | POA: Diagnosis not present

## 2019-05-25 DIAGNOSIS — C3411 Malignant neoplasm of upper lobe, right bronchus or lung: Secondary | ICD-10-CM

## 2019-05-25 DIAGNOSIS — C3490 Malignant neoplasm of unspecified part of unspecified bronchus or lung: Secondary | ICD-10-CM | POA: Diagnosis not present

## 2019-05-25 DIAGNOSIS — Z87891 Personal history of nicotine dependence: Secondary | ICD-10-CM | POA: Diagnosis not present

## 2019-05-25 DIAGNOSIS — Z5111 Encounter for antineoplastic chemotherapy: Secondary | ICD-10-CM | POA: Insufficient documentation

## 2019-05-25 DIAGNOSIS — C7931 Secondary malignant neoplasm of brain: Secondary | ICD-10-CM | POA: Diagnosis not present

## 2019-05-25 DIAGNOSIS — Z95828 Presence of other vascular implants and grafts: Secondary | ICD-10-CM

## 2019-05-25 LAB — COMPREHENSIVE METABOLIC PANEL
ALT: 11 U/L (ref 0–44)
AST: 21 U/L (ref 15–41)
Albumin: 3.2 g/dL — ABNORMAL LOW (ref 3.5–5.0)
Alkaline Phosphatase: 73 U/L (ref 38–126)
Anion gap: 10 (ref 5–15)
BUN: 14 mg/dL (ref 8–23)
CO2: 24 mmol/L (ref 22–32)
Calcium: 9.4 mg/dL (ref 8.9–10.3)
Chloride: 107 mmol/L (ref 98–111)
Creatinine, Ser: 0.81 mg/dL (ref 0.44–1.00)
GFR calc Af Amer: >60 mL/min (ref >60)
GFR calc non Af Amer: >60 mL/min (ref >60)
Glucose, Bld: 133 mg/dL — ABNORMAL HIGH (ref 70–99)
Potassium: 4.3 mmol/L (ref 3.5–5.1)
Sodium: 141 mmol/L (ref 135–145)
Total Bilirubin: 0.3 mg/dL (ref 0.3–1.2)
Total Protein: 6.6 g/dL (ref 6.5–8.1)

## 2019-05-25 LAB — CBC WITH DIFFERENTIAL/PLATELET
Abs Immature Granulocytes: 0.05 10*3/uL (ref 0.00–0.07)
Basophils Absolute: 0 10*3/uL (ref 0.0–0.1)
Basophils Relative: 0 %
Eosinophils Absolute: 0.1 10*3/uL (ref 0.0–0.5)
Eosinophils Relative: 1 %
HCT: 36 % (ref 36.0–46.0)
Hemoglobin: 11.4 g/dL — ABNORMAL LOW (ref 12.0–15.0)
Immature Granulocytes: 1 %
Lymphocytes Relative: 16 %
Lymphs Abs: 1.7 10*3/uL (ref 0.7–4.0)
MCH: 31.7 pg (ref 26.0–34.0)
MCHC: 31.7 g/dL (ref 30.0–36.0)
MCV: 100 fL (ref 80.0–100.0)
Monocytes Absolute: 1.1 10*3/uL — ABNORMAL HIGH (ref 0.1–1.0)
Monocytes Relative: 10 %
Neutro Abs: 7.9 10*3/uL — ABNORMAL HIGH (ref 1.7–7.7)
Neutrophils Relative %: 72 %
Platelets: 361 10*3/uL (ref 150–400)
RBC: 3.6 MIL/uL — ABNORMAL LOW (ref 3.87–5.11)
RDW: 13.9 % (ref 11.5–15.5)
WBC: 10.9 10*3/uL — ABNORMAL HIGH (ref 4.0–10.5)
nRBC: 0 % (ref 0.0–0.2)

## 2019-05-25 MED ORDER — CYANOCOBALAMIN 1000 MCG/ML IJ SOLN
1000.0000 ug | Freq: Once | INTRAMUSCULAR | Status: AC
  Administered 2019-05-25: 1000 ug via INTRAMUSCULAR

## 2019-05-25 MED ORDER — HEPARIN SOD (PORK) LOCK FLUSH 100 UNIT/ML IV SOLN
500.0000 [IU] | Freq: Once | INTRAVENOUS | Status: AC | PRN
  Administered 2019-05-25: 500 [IU]
  Filled 2019-05-25: qty 5

## 2019-05-25 MED ORDER — SODIUM CHLORIDE 0.9% FLUSH
10.0000 mL | INTRAVENOUS | Status: DC | PRN
  Administered 2019-05-25: 10 mL via INTRAVENOUS
  Filled 2019-05-25: qty 10

## 2019-05-25 MED ORDER — SODIUM CHLORIDE 0.9 % IV SOLN
500.0000 mg/m2 | Freq: Once | INTRAVENOUS | Status: AC
  Administered 2019-05-25: 800 mg via INTRAVENOUS
  Filled 2019-05-25: qty 20

## 2019-05-25 MED ORDER — ONDANSETRON HCL 8 MG PO TABS
8.0000 mg | ORAL_TABLET | Freq: Once | ORAL | Status: AC
  Administered 2019-05-25: 8 mg via ORAL

## 2019-05-25 MED ORDER — SODIUM CHLORIDE 0.9 % IV SOLN
Freq: Once | INTRAVENOUS | Status: AC
  Filled 2019-05-25: qty 250

## 2019-05-25 MED ORDER — SODIUM CHLORIDE 0.9 % IV SOLN
10.0000 mg | Freq: Once | INTRAVENOUS | Status: AC
  Administered 2019-05-25: 10 mg via INTRAVENOUS
  Filled 2019-05-25: qty 10

## 2019-05-25 MED ORDER — ONDANSETRON HCL 8 MG PO TABS
ORAL_TABLET | ORAL | Status: AC
  Filled 2019-05-25: qty 1

## 2019-05-25 MED ORDER — SODIUM CHLORIDE 0.9% FLUSH
10.0000 mL | INTRAVENOUS | Status: DC | PRN
  Administered 2019-05-25: 10 mL
  Filled 2019-05-25: qty 10

## 2019-05-25 MED ORDER — CYANOCOBALAMIN 1000 MCG/ML IJ SOLN
INTRAMUSCULAR | Status: AC
  Filled 2019-05-25: qty 1

## 2019-05-25 NOTE — Progress Notes (Signed)
[place] Telephone:(336) [phone]   Fax:(336) [phone]  OFFICE PROGRESS NOTE  [name], MD [place] [address]  DIAGNOSIS: Metastatic non-small cell lung cancer initially diagnosed as locally advanced stage IIIB with a right Pancoast tumor involving the vertebral body as well as prominent canal invasion with spinal cord compression in October 2007. The patient also has metastatic disease to the brain in April 2008.  PRIOR THERAPY: 1. Status post concurrent chemoradiation with weekly carboplatin and paclitaxel, last dose was given November 18, 2005. 2. 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 3. Status post gamma knife radiotherapy to a solitary brain lesion located in the superior frontal area of the brain at [facility] in April of 2008. 4. Status post palliative radiotherapy to the lateral abdominal wall metastatic lesion under the care of Dr. Lisbeth Renshaw, completed March of 2009. 5. Status post 6 cycles of systemic chemotherapy with carboplatin and Alimta. Last dose was given July 26, 2007 with disease stabilization. 6. Gamma knife stereotactic radiotherapy to 2 brain lesions one involving the right frontal dural based as well as right parietal lesion performed on 05/07/2012 under the care of Dr. Vallarie Mare at [facility].  CURRENT THERAPY: Maintenance systemic chemotherapy with Alimta 500 MG/M2 every 3 weeks, status post 203 cycles.  INTERVAL HISTORY: Danielle [name] 69 y.o. female returns to the clinic today for follow-up visit.  The patient is feeling fine today with no concerning complaints.  She denied having any current chest pain, shortness of breath, cough or hemoptysis.  She denied having any fever or chills.  She has no vomiting, diarrhea or constipation but has occasional nausea resolved with Zofran.  The patient denied having any current weight  loss or night sweats.  She has no headache or visual changes.  She continues to tolerate her treatment with maintenance Alimta fairly well.  She is here for evaluation before starting cycle #204.  MEDICAL HISTORY: Past Medical History:  Diagnosis Date  . Anemia   . 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 Carcinomas, basal cell 01/22/2019   two places on face  . CVA (cerebral vascular accident) ([place]) 04/2014   pt states had 2 cva's within 2 wks  . DJD (degenerative joint disease), cervical   . Fibromyalgia   . H/O: pneumonia   . History of tobacco abuse quit 10/08   on nicotine patch  . Hypertension   . Hypokalemia   . Lung cancer ([place]) dx'd 09/2005   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 had chemo and radiation for lung ca  . Thrush 12/11/2010    ALLERGIES:  is allergic to contrast media [iodinated diagnostic agents]; iohexol; sulfa drugs cross reactors; and co-trimoxazole injection [sulfamethoxazole-trimethoprim].  MEDICATIONS:  Current Outpatient Medications  Medication Sig Dispense Refill  . amLODipine (NORVASC) 10 MG tablet TAKE 1 TABLET (10 MG TOTAL) BY MOUTH DAILY. 90 tablet 3  . aspirin 325 MG EC tablet TAKE 1 TABLET BY MOUTH EVERY DAY 90 tablet 0  . atorvastatin (LIPITOR) 40 MG tablet TAKE 1 TABLET BY MOUTH EVERY DAY AT 6 PM 90 tablet 3  . diphenhydrAMINE (BENADRYL) 25 MG tablet Take 1 tablet (25 mg total) by mouth as directed. 2 hours before CT scan. 5 tablet 0  . FeFum-FePoly-FA-B Cmp-C-Biot (INTEGRA PLUS) CAPS TAKE 1 CAPSULE BY MOUTH EVERY DAY IN THE MORNING 90 capsule 3  . fluticasone (FLONASE) 50  MCG/ACT nasal spray Place 1-2 sprays into both nostrils daily as needed. 16 g 5  . folic acid (FOLVITE) 1 MG tablet TAKE 1 TABLET BY MOUTH EVERY DAY 90 tablet 1  . furosemide (LASIX) 20 MG tablet TAKE 1 TABLET BY MOUTH EVERY DAY AS NEEDED FOR SWELLING 20 tablet 1  . lisinopril (ZESTRIL) 20 MG tablet TAKE 2 TABLETS BY MOUTH EVERY DAY 180 tablet 3  . morphine  (MS CONTIN) 60 MG 12 hr tablet Take 1 tablet (60 mg total) by mouth 2 (two) times daily. 60 tablet 0  . morphine (MSIR) 30 MG tablet Take 1 tablet (30 mg total) by mouth every 4 (four) hours as needed (breakthrough pain). 60 tablet 0  . Olopatadine HCl 0.2 % SOLN INSTILL 1 DROP INTO AFFECTED EYE(S) EVERY DAY 2.5 mL 1  . ondansetron (ZOFRAN) 8 MG tablet TAKE 1 TABLET BY MOUTH EVERY 8 HOURS AS NEEDED FOR NAUSEA AND VOMITING 30 tablet 2  . predniSONE (DELTASONE) 50 MG tablet 1 tablet by mouth 13 hours ,7 hours and 1 hour  before CT scan 3 tablet 4  . triamcinolone (KENALOG) 0.025 % ointment Apply 1 application topically 2 (two) times daily. 30 g 0   No current facility-administered medications for this visit.    SURGICAL HISTORY:  Past Surgical History:  Procedure Laterality Date  . CERVICAL LAMINECTOMY  1995  . KNEE SURGERY  1990   Left x 2  . 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 LOOP RECORDER IMPLANT N/A 04/19/2014   Procedure: LOOP RECORDER IMPLANT;  Surgeon: Thompson Grayer, MD;  Location: [facility] CATH LAB;  Service: Cardiovascular;  Laterality: N/A;  . OTHER SURGICAL HISTORY  2008   Gamma knife surgery to remove brain met   . PORTACATH PLACEMENT  -[name]   TIP IN CAVOATRIAL JUNCTION  . TEE WITHOUT CARDIOVERSION N/A 04/19/2014   Procedure: TRANSESOPHAGEAL ECHOCARDIOGRAM (TEE);  Surgeon: Larey Dresser, MD;  Location: [facility] ENDOSCOPY;  Service: Cardiovascular;  Laterality: N/A;    REVIEW OF SYSTEMS:  A comprehensive review of systems was negative.   PHYSICAL EXAMINATION: General appearance: alert, cooperative and no distress Head: Normocephalic, without obvious abnormality, atraumatic Neck: no adenopathy, no JVD, supple, symmetrical, trachea midline and thyroid not enlarged, symmetric, no tenderness/mass/nodules Lymph nodes: Cervical, supraclavicular, and axillary nodes normal. Resp: clear to auscultation bilaterally Back: symmetric, no curvature. ROM normal.  No CVA tenderness. Cardio: regular rate and rhythm, S1, S2 normal, no murmur, click, rub or gallop GI: soft, non-tender; bowel sounds normal; no masses,  no organomegaly Extremities: extremities normal, atraumatic, no cyanosis or edema  ECOG PERFORMANCE STATUS: 1 - Symptomatic but completely ambulatory  Blood pressure (!) 144/62, pulse (!) 102, temperature 98.7 F (37.1 C), resp. rate 20, height 5' 2" (1.575 m), weight 147 lb 12.8 oz (67 kg), SpO2 99 %.  LABORATORY DATA: Lab Results  Component Value Date   WBC 10.9 (H) 05/25/2019   HGB 11.4 (L) 05/25/2019   HCT 36.0 05/25/2019   MCV 100.0 05/25/2019   PLT 361 05/25/2019      Chemistry      Component Value Date/Time   NA 141 05/25/2019 0822   NA 142 12/24/2016 0816   K 4.3 05/25/2019 0822   K 4.4 12/24/2016 0816   CL 107 05/25/2019 0822   CL 106 06/30/2012 1004   CO2 24 05/25/2019 0822   CO2 28 12/24/2016 0816   BUN 14 05/25/2019 4403  BUN 15.8 12/24/2016 0816   CREATININE 0.81 05/25/2019 0822   CREATININE 0.87 07/22/2017 0826   CREATININE 1.1 12/24/2016 0816      Component Value Date/Time   CALCIUM 9.4 05/25/2019 0822   CALCIUM 9.6 12/24/2016 0816   ALKPHOS 73 05/25/2019 0822   ALKPHOS 80 12/24/2016 0816   AST 21 05/25/2019 0822   AST 20 07/22/2017 0826   AST 20 12/24/2016 0816   ALT 11 05/25/2019 0822   ALT 11 07/22/2017 0826   ALT 11 12/24/2016 0816   BILITOT 0.3 05/25/2019 0822   BILITOT 0.4 07/22/2017 0826   BILITOT 0.34 12/24/2016 0816       RADIOGRAPHIC STUDIES: No results found.  ASSESSMENT AND PLAN: This is a very pleasant 68 years old white female with metastatic non-small cell lung cancer diagnosed in October 2007 status post concurrent chemoradiation followed by consolidation chemotherapy and she is currently on maintenance treatment with single agent Alimta status post 203 cycles. The patient continues to tolerate her treatment well with no concerning adverse effect except for occasional mild  nausea improved with Zofran. I recommended for her to proceed with cycle #204 today as planned. I will see her back for follow-up visit in 3 weeks for evaluation before cycle #205. She was advised to call immediately if she has any concerning symptoms in the interval. For pain management the patient will continue her current pain medication. The patient voices understanding of current disease status and treatment options and is in agreement with the current care plan. All questions were answered. The patient knows to call the clinic with any problems, questions or concerns. We can certainly see the patient much sooner if necessary.  Disclaimer: This note was dictated with voice recognition software. Similar sounding words can inadvertently be transcribed and may not be corrected upon review.       

## 2019-05-25 NOTE — Patient Instructions (Signed)
[facility] Discharge Instructions for Patients Receiving Chemotherapy  Today you received the following chemotherapy agents: Alimta  To help prevent nausea and vomiting after your treatment, we encourage you to take your nausea medication as prescribed.   If you develop nausea and vomiting that is not controlled by your nausea medication, call the clinic.   BELOW ARE SYMPTOMS THAT SHOULD BE REPORTED IMMEDIATELY:  *FEVER GREATER THAN 100.5 F  *CHILLS WITH OR WITHOUT FEVER  NAUSEA AND VOMITING THAT IS NOT CONTROLLED WITH YOUR NAUSEA MEDICATION  *UNUSUAL SHORTNESS OF BREATH  *UNUSUAL BRUISING OR BLEEDING  TENDERNESS IN MOUTH AND THROAT WITH OR WITHOUT PRESENCE OF ULCERS  *URINARY PROBLEMS  *BOWEL PROBLEMS  UNUSUAL RASH Items with * indicate a potential emergency and should be followed up as soon as possible.  Feel free to call the clinic should you have any questions or concerns. The clinic phone number is [phone].  Please show the [place] at check-in to the Emergency Department and triage nurse.

## 2019-05-26 ENCOUNTER — Telehealth: Payer: Self-pay | Admitting: Internal Medicine

## 2019-05-26 NOTE — Telephone Encounter (Signed)
Scheduled per los. Called and left msg. Mailed printout  °

## 2019-06-08 NOTE — Progress Notes (Signed)
Pharmacist Chemotherapy Monitoring - Follow Up Assessment    I verify that I have reviewed each item in the below checklist:  . Regimen for the patient is scheduled for the appropriate day and plan matches scheduled date. Marland Kitchen Appropriate non-routine labs are ordered dependent on drug ordered. . If applicable, additional medications reviewed and ordered per protocol based on lifetime cumulative doses and/or treatment regimen.   Plan for follow-up and/or issues identified: No . I-vent associated with next due treatment: No . MD and/or nursing notified: No   Kennith Center, Pharm.D., CPP 06/08/2019@8 :29 AM

## 2019-06-10 DIAGNOSIS — Z23 Encounter for immunization: Secondary | ICD-10-CM | POA: Diagnosis not present

## 2019-06-11 ENCOUNTER — Other Ambulatory Visit: Payer: Self-pay | Admitting: Medical Oncology

## 2019-06-11 DIAGNOSIS — C3490 Malignant neoplasm of unspecified part of unspecified bronchus or lung: Secondary | ICD-10-CM

## 2019-06-14 ENCOUNTER — Inpatient Hospital Stay: Payer: Medicare Other

## 2019-06-14 ENCOUNTER — Other Ambulatory Visit: Payer: Self-pay

## 2019-06-14 ENCOUNTER — Inpatient Hospital Stay: Payer: Medicare Other | Attending: Physician Assistant | Admitting: Internal Medicine

## 2019-06-14 ENCOUNTER — Encounter: Payer: Self-pay | Admitting: Internal Medicine

## 2019-06-14 VITALS — BP 128/78 | HR 91 | Temp 98.2°F | Resp 18 | Ht 62.0 in | Wt 139.9 lb

## 2019-06-14 DIAGNOSIS — C3411 Malignant neoplasm of upper lobe, right bronchus or lung: Secondary | ICD-10-CM

## 2019-06-14 DIAGNOSIS — C3410 Malignant neoplasm of upper lobe, unspecified bronchus or lung: Secondary | ICD-10-CM

## 2019-06-14 DIAGNOSIS — Z5111 Encounter for antineoplastic chemotherapy: Secondary | ICD-10-CM | POA: Insufficient documentation

## 2019-06-14 DIAGNOSIS — C7931 Secondary malignant neoplasm of brain: Secondary | ICD-10-CM | POA: Insufficient documentation

## 2019-06-14 DIAGNOSIS — C3490 Malignant neoplasm of unspecified part of unspecified bronchus or lung: Secondary | ICD-10-CM

## 2019-06-14 DIAGNOSIS — Z95828 Presence of other vascular implants and grafts: Secondary | ICD-10-CM

## 2019-06-14 DIAGNOSIS — I10 Essential (primary) hypertension: Secondary | ICD-10-CM | POA: Diagnosis not present

## 2019-06-14 LAB — CBC WITH DIFFERENTIAL (CANCER CENTER ONLY)
Abs Immature Granulocytes: 0.04 10*3/uL (ref 0.00–0.07)
Basophils Absolute: 0 10*3/uL (ref 0.0–0.1)
Basophils Relative: 0 %
Eosinophils Absolute: 0 10*3/uL (ref 0.0–0.5)
Eosinophils Relative: 0 %
HCT: 39.2 % (ref 36.0–46.0)
Hemoglobin: 12.8 g/dL (ref 12.0–15.0)
Immature Granulocytes: 0 %
Lymphocytes Relative: 17 %
Lymphs Abs: 2.1 10*3/uL (ref 0.7–4.0)
MCH: 31.5 pg (ref 26.0–34.0)
MCHC: 32.7 g/dL (ref 30.0–36.0)
MCV: 96.6 fL (ref 80.0–100.0)
Monocytes Absolute: 1.8 10*3/uL — ABNORMAL HIGH (ref 0.1–1.0)
Monocytes Relative: 14 %
Neutro Abs: 8.6 10*3/uL — ABNORMAL HIGH (ref 1.7–7.7)
Neutrophils Relative %: 69 %
Platelet Count: 409 10*3/uL — ABNORMAL HIGH (ref 150–400)
RBC: 4.06 MIL/uL (ref 3.87–5.11)
RDW: 13.4 % (ref 11.5–15.5)
WBC Count: 12.6 10*3/uL — ABNORMAL HIGH (ref 4.0–10.5)
nRBC: 0 % (ref 0.0–0.2)

## 2019-06-14 LAB — CMP (CANCER CENTER ONLY)
ALT: 15 U/L (ref 0–44)
AST: 22 U/L (ref 15–41)
Albumin: 3.4 g/dL — ABNORMAL LOW (ref 3.5–5.0)
Alkaline Phosphatase: 76 U/L (ref 38–126)
Anion gap: 12 (ref 5–15)
BUN: 17 mg/dL (ref 8–23)
CO2: 29 mmol/L (ref 22–32)
Calcium: 9.7 mg/dL (ref 8.9–10.3)
Chloride: 97 mmol/L — ABNORMAL LOW (ref 98–111)
Creatinine: 0.9 mg/dL (ref 0.44–1.00)
GFR, Est AFR Am: >60 mL/min (ref >60)
GFR, Estimated: >60 mL/min (ref >60)
Glucose, Bld: 108 mg/dL — ABNORMAL HIGH (ref 70–99)
Potassium: 3.4 mmol/L — ABNORMAL LOW (ref 3.5–5.1)
Sodium: 138 mmol/L (ref 135–145)
Total Bilirubin: 0.5 mg/dL (ref 0.3–1.2)
Total Protein: 7.3 g/dL (ref 6.5–8.1)

## 2019-06-14 MED ORDER — ONDANSETRON HCL 8 MG PO TABS
ORAL_TABLET | ORAL | Status: AC
  Filled 2019-06-14: qty 1

## 2019-06-14 MED ORDER — MORPHINE SULFATE 30 MG PO TABS: 30.0000 mg | ORAL_TABLET | ORAL | 0 refills | Status: DC | PRN

## 2019-06-14 MED ORDER — SODIUM CHLORIDE 0.9 % IV SOLN
Freq: Once | INTRAVENOUS | Status: AC
  Filled 2019-06-14: qty 250

## 2019-06-14 MED ORDER — HEPARIN SOD (PORK) LOCK FLUSH 100 UNIT/ML IV SOLN
500.0000 [IU] | Freq: Once | INTRAVENOUS | Status: AC | PRN
  Administered 2019-06-14: 500 [IU]
  Filled 2019-06-14: qty 5

## 2019-06-14 MED ORDER — MORPHINE SULFATE ER 60 MG PO TBCR: 60.0000 mg | EXTENDED_RELEASE_TABLET | Freq: Two times a day (BID) | ORAL | 0 refills | Status: DC

## 2019-06-14 MED ORDER — LIDOCAINE-PRILOCAINE 2.5-2.5 % EX CREA
TOPICAL_CREAM | CUTANEOUS | 0 refills | Status: DC
Start: 2019-06-14 — End: 2021-03-26

## 2019-06-14 MED ORDER — SODIUM CHLORIDE 0.9% FLUSH
10.0000 mL | INTRAVENOUS | Status: DC | PRN
  Administered 2019-06-14: 10 mL via INTRAVENOUS
  Filled 2019-06-14: qty 10

## 2019-06-14 MED ORDER — SODIUM CHLORIDE 0.9 % IV SOLN
500.0000 mg/m2 | Freq: Once | INTRAVENOUS | Status: AC
  Administered 2019-06-14: 800 mg via INTRAVENOUS
  Filled 2019-06-14: qty 20

## 2019-06-14 MED ORDER — SODIUM CHLORIDE 0.9 % IV SOLN
10.0000 mg | Freq: Once | INTRAVENOUS | Status: AC
  Administered 2019-06-14: 10 mg via INTRAVENOUS
  Filled 2019-06-14: qty 10

## 2019-06-14 MED ORDER — SODIUM CHLORIDE 0.9% FLUSH
10.0000 mL | INTRAVENOUS | Status: DC | PRN
  Administered 2019-06-14: 10 mL
  Filled 2019-06-14: qty 10

## 2019-06-14 MED ORDER — ONDANSETRON HCL 8 MG PO TABS
8.0000 mg | ORAL_TABLET | Freq: Once | ORAL | Status: AC
  Administered 2019-06-14: 8 mg via ORAL

## 2019-06-14 NOTE — Patient Instructions (Signed)
[facility] Discharge Instructions for Patients Receiving Chemotherapy  Today you received the following chemotherapy agents: pemetrexed.  To help prevent nausea and vomiting after your treatment, we encourage you to take your nausea medication as directed.   If you develop nausea and vomiting that is not controlled by your nausea medication, call the clinic.   BELOW ARE SYMPTOMS THAT SHOULD BE REPORTED IMMEDIATELY:  *FEVER GREATER THAN 100.5 F  *CHILLS WITH OR WITHOUT FEVER  NAUSEA AND VOMITING THAT IS NOT CONTROLLED WITH YOUR NAUSEA MEDICATION  *UNUSUAL SHORTNESS OF BREATH  *UNUSUAL BRUISING OR BLEEDING  TENDERNESS IN MOUTH AND THROAT WITH OR WITHOUT PRESENCE OF ULCERS  *URINARY PROBLEMS  *BOWEL PROBLEMS  UNUSUAL RASH Items with * indicate a potential emergency and should be followed up as soon as possible.  Feel free to call the clinic should you have any questions or concerns. The clinic phone number is [phone].  Please show the [place] at check-in to the Emergency Department and triage nurse.

## 2019-06-14 NOTE — Progress Notes (Signed)
[place] Telephone:([phone]   [phone]  OFFICE PROGRESS NOTE  Danielle Sanders, MD [place] [address]  DIAGNOSIS: Metastatic non-small cell lung cancer initially diagnosed as locally advanced stage IIIB with a right Pancoast tumor involving the vertebral body as well as prominent canal invasion with spinal cord compression in October 2007. The patient also has metastatic disease to the brain in April 2008.  PRIOR THERAPY: 1. Status post concurrent chemoradiation with weekly carboplatin and paclitaxel, last dose was given November 18, 2005. 2. 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 3. Status post gamma knife radiotherapy to a solitary brain lesion located in the superior frontal area of the brain at [facility] in April of 2008. 4. Status post palliative radiotherapy to the lateral abdominal wall metastatic lesion under the care of Dr. Lisbeth Renshaw, completed March of 2009. 5. Status post 6 cycles of systemic chemotherapy with carboplatin and Alimta. Last dose was given July 26, 2007 with disease stabilization. 6. Gamma knife stereotactic radiotherapy to 2 brain lesions one involving the right frontal dural based as well as right parietal lesion performed on 05/07/2012 under the care of Dr. Vallarie Mare at [facility].  CURRENT THERAPY: Maintenance systemic chemotherapy with Alimta 500 MG/M2 every 3 weeks, status post 204 cycles.  INTERVAL HISTORY: Danielle [name] 69 y.o. female returns to the clinic today for follow-up visit.  The patient is feeling fine today with no concerning complaints.  She had the second Covid vaccine shots recently and she had aching pain and fatigue for several days.  She also had lack of appetite and lost few pounds.  She denied having any current chest pain, shortness of breath, cough or hemoptysis.  She denied having any fever  or chills.  She has no nausea, vomiting, diarrhea or constipation.  She denied having any headache or visual changes.  She is here today for evaluation before starting cycle #205.  MEDICAL HISTORY: Past Medical History:  Diagnosis Date  . Anemia   . 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 Carcinomas, basal cell 01/22/2019   two places on face  . CVA (cerebral vascular accident) ([place]) 04/2014   pt states had 2 cva's within 2 wks  . DJD (degenerative joint disease), cervical   . Fibromyalgia   . H/O: pneumonia   . History of tobacco abuse quit 10/08   on nicotine patch  . Hypertension   . Hypokalemia   . Lung cancer ([place]) dx'd 09/2005   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 had chemo and radiation for lung ca  . Thrush 12/11/2010    ALLERGIES:  is allergic to contrast media [iodinated diagnostic agents]; iohexol; sulfa drugs cross reactors; and co-trimoxazole injection [sulfamethoxazole-trimethoprim].  MEDICATIONS:  Current Outpatient Medications  Medication Sig Dispense Refill  . amLODipine (NORVASC) 10 MG tablet TAKE 1 TABLET (10 MG TOTAL) BY MOUTH DAILY. 90 tablet 3  . aspirin 325 MG EC tablet TAKE 1 TABLET BY MOUTH EVERY DAY 90 tablet 0  . atorvastatin (LIPITOR) 40 MG tablet TAKE 1 TABLET BY MOUTH EVERY DAY AT 6 PM 90 tablet 3  . diphenhydrAMINE (BENADRYL) 25 MG tablet Take 1 tablet (25 mg total) by mouth as directed. 2 hours before CT scan. 5 tablet 0  . FeFum-FePoly-FA-B Cmp-C-Biot (INTEGRA PLUS) CAPS TAKE 1 CAPSULE BY MOUTH EVERY DAY IN THE MORNING 90 capsule 3  . fluticasone (  FLONASE) 50 MCG/ACT nasal spray Place 1-2 sprays into both nostrils daily as needed. 16 g 5  . folic acid (FOLVITE) 1 MG tablet TAKE 1 TABLET BY MOUTH EVERY DAY 90 tablet 1  . furosemide (LASIX) 20 MG tablet TAKE 1 TABLET BY MOUTH EVERY DAY AS NEEDED FOR SWELLING 20 tablet 1  . lisinopril (ZESTRIL) 20 MG tablet TAKE 2 TABLETS BY MOUTH EVERY DAY 180 tablet 3  . morphine (MS CONTIN) 60  MG 12 hr tablet Take 1 tablet (60 mg total) by mouth 2 (two) times daily. 60 tablet 0  . morphine (MSIR) 30 MG tablet Take 1 tablet (30 mg total) by mouth every 4 (four) hours as needed (breakthrough pain). 60 tablet 0  . Olopatadine HCl 0.2 % SOLN INSTILL 1 DROP INTO AFFECTED EYE(S) EVERY DAY 2.5 mL 1  . ondansetron (ZOFRAN) 8 MG tablet TAKE 1 TABLET BY MOUTH EVERY 8 HOURS AS NEEDED FOR NAUSEA AND VOMITING 30 tablet 2  . predniSONE (DELTASONE) 50 MG tablet 1 tablet by mouth 13 hours ,7 hours and 1 hour  before CT scan 3 tablet 4  . triamcinolone (KENALOG) 0.025 % ointment Apply 1 application topically 2 (two) times daily. 30 g 0   No current facility-administered medications for this visit.    SURGICAL HISTORY:  Past Surgical History:  Procedure Laterality Date  . CERVICAL LAMINECTOMY  1995  . KNEE SURGERY  1990   Left x 2  . 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 LOOP RECORDER IMPLANT N/A 04/19/2014   Procedure: LOOP RECORDER IMPLANT;  Surgeon: Thompson Grayer, MD;  Location: [facility] CATH LAB;  Service: Cardiovascular;  Laterality: N/A;  . OTHER SURGICAL HISTORY  2008   Gamma knife surgery to remove brain met   . PORTACATH PLACEMENT  -[name]   TIP IN CAVOATRIAL JUNCTION  . TEE WITHOUT CARDIOVERSION N/A 04/19/2014   Procedure: TRANSESOPHAGEAL ECHOCARDIOGRAM (TEE);  Surgeon: Larey Dresser, MD;  Location: [facility];  Service: Cardiovascular;  Laterality: N/A;    REVIEW OF SYSTEMS:  A comprehensive review of systems was negative except for: Constitutional: positive for fatigue   PHYSICAL EXAMINATION: General appearance: alert, cooperative, fatigued and no distress Head: Normocephalic, without obvious abnormality, atraumatic Neck: no adenopathy, no JVD, supple, symmetrical, trachea midline and thyroid not enlarged, symmetric, no tenderness/mass/nodules Lymph nodes: Cervical, supraclavicular, and axillary nodes normal. Resp: clear to auscultation  bilaterally Back: symmetric, no curvature. ROM normal. No CVA tenderness. Cardio: regular rate and rhythm, S1, S2 normal, no murmur, click, rub or gallop GI: soft, non-tender; bowel sounds normal; no masses,  no organomegaly Extremities: extremities normal, atraumatic, no cyanosis or edema  ECOG PERFORMANCE STATUS: 1 - Symptomatic but completely ambulatory  Blood pressure (!) 177/71, pulse 91, temperature 98.2 F (36.8 C), temperature source Temporal, resp. rate 18, height '5\' 2"'  (1.575 m), weight 139 lb 14.4 oz (63.5 kg), SpO2 95 %.  LABORATORY DATA: Lab Results  Component Value Date   WBC 12.6 (H) 06/14/2019   HGB 12.8 06/14/2019   HCT 39.2 06/14/2019   MCV 96.6 06/14/2019   PLT 409 (H) 06/14/2019      Chemistry      Component Value Date/Time   NA 141 05/25/2019 0822   NA 142 12/24/2016 0816   K 4.3 05/25/2019 0822   K 4.4 12/24/2016 0816   CL 107 05/25/2019 0822   CL 106 06/30/2012 1004   CO2 24 05/25/2019 0822   CO2  28 12/24/2016 0816   BUN 14 05/25/2019 0822   BUN 15.8 12/24/2016 0816   CREATININE 0.81 05/25/2019 0822   CREATININE 0.87 07/22/2017 0826   CREATININE 1.1 12/24/2016 0816      Component Value Date/Time   CALCIUM 9.4 05/25/2019 0822   CALCIUM 9.6 12/24/2016 0816   ALKPHOS 73 05/25/2019 0822   ALKPHOS 80 12/24/2016 0816   AST 21 05/25/2019 0822   AST 20 07/22/2017 0826   AST 20 12/24/2016 0816   ALT 11 05/25/2019 0822   ALT 11 07/22/2017 0826   ALT 11 12/24/2016 0816   BILITOT 0.3 05/25/2019 0822   BILITOT 0.4 07/22/2017 0826   BILITOT 0.34 12/24/2016 0816       RADIOGRAPHIC STUDIES: No results found.  ASSESSMENT AND PLAN: This is a very pleasant 69 years old white female with metastatic non-small cell lung cancer diagnosed in October 2007 status post concurrent chemoradiation followed by consolidation chemotherapy and she is currently on maintenance treatment with single agent Alimta status post 204 cycles. The patient has been tolerating  this treatment well with no concerning adverse effects. I recommended for her to proceed with cycle #205 today as planned. For the hypertension we will recheck her blood pressure again.  The patient was advised to take her blood pressure medications at home and to monitor it closely. She will come back for follow-up visit in 3 weeks for evaluation before the next cycle of her treatment. For pain management I gave her a refill of MS Contin and MSIR.  I also gave her a refill of EMLA cream.   She was advised to call immediately if she has any concerning symptoms in the interval. The patient voices understanding of current disease status and treatment options and is in agreement with the current care plan. All questions were answered. The patient knows to call the clinic with any problems, questions or concerns. We can certainly see the patient much sooner if necessary.  Disclaimer: This note was dictated with voice recognition software. Similar sounding words can inadvertently be transcribed and may not be corrected upon review.

## 2019-06-17 ENCOUNTER — Other Ambulatory Visit: Payer: Self-pay | Admitting: Internal Medicine

## 2019-06-17 DIAGNOSIS — C3411 Malignant neoplasm of upper lobe, right bronchus or lung: Secondary | ICD-10-CM

## 2019-06-17 DIAGNOSIS — C7931 Secondary malignant neoplasm of brain: Secondary | ICD-10-CM

## 2019-06-20 ENCOUNTER — Other Ambulatory Visit: Payer: Self-pay | Admitting: Internal Medicine

## 2019-07-06 ENCOUNTER — Other Ambulatory Visit: Payer: Self-pay | Admitting: Physician Assistant

## 2019-07-06 ENCOUNTER — Other Ambulatory Visit: Payer: Self-pay | Admitting: *Deleted

## 2019-07-06 DIAGNOSIS — C3490 Malignant neoplasm of unspecified part of unspecified bronchus or lung: Secondary | ICD-10-CM

## 2019-07-07 ENCOUNTER — Encounter: Payer: Self-pay | Admitting: Internal Medicine

## 2019-07-07 ENCOUNTER — Inpatient Hospital Stay: Payer: Medicare Other

## 2019-07-07 ENCOUNTER — Inpatient Hospital Stay (HOSPITAL_BASED_OUTPATIENT_CLINIC_OR_DEPARTMENT_OTHER): Payer: Medicare Other | Admitting: Internal Medicine

## 2019-07-07 ENCOUNTER — Other Ambulatory Visit: Payer: Self-pay

## 2019-07-07 VITALS — BP 119/59 | HR 99 | Temp 100.0°F | Resp 19 | Ht 62.0 in | Wt 145.7 lb

## 2019-07-07 DIAGNOSIS — C3490 Malignant neoplasm of unspecified part of unspecified bronchus or lung: Secondary | ICD-10-CM

## 2019-07-07 DIAGNOSIS — C3411 Malignant neoplasm of upper lobe, right bronchus or lung: Secondary | ICD-10-CM | POA: Diagnosis not present

## 2019-07-07 DIAGNOSIS — Z95828 Presence of other vascular implants and grafts: Secondary | ICD-10-CM

## 2019-07-07 DIAGNOSIS — Z5111 Encounter for antineoplastic chemotherapy: Secondary | ICD-10-CM

## 2019-07-07 LAB — CMP (CANCER CENTER ONLY)
ALT: 11 U/L (ref 0–44)
AST: 20 U/L (ref 15–41)
Albumin: 3.2 g/dL — ABNORMAL LOW (ref 3.5–5.0)
Alkaline Phosphatase: 77 U/L (ref 38–126)
Anion gap: 10 (ref 5–15)
BUN: 12 mg/dL (ref 8–23)
CO2: 23 mmol/L (ref 22–32)
Calcium: 9.2 mg/dL (ref 8.9–10.3)
Chloride: 106 mmol/L (ref 98–111)
Creatinine: 1.03 mg/dL — ABNORMAL HIGH (ref 0.44–1.00)
GFR, Est AFR Am: >60 mL/min (ref >60)
GFR, Estimated: 57 mL/min — ABNORMAL LOW (ref >60)
Glucose, Bld: 119 mg/dL — ABNORMAL HIGH (ref 70–99)
Potassium: 4.4 mmol/L (ref 3.5–5.1)
Sodium: 139 mmol/L (ref 135–145)
Total Bilirubin: 0.4 mg/dL (ref 0.3–1.2)
Total Protein: 6.7 g/dL (ref 6.5–8.1)

## 2019-07-07 LAB — CBC WITH DIFFERENTIAL (CANCER CENTER ONLY)
Abs Immature Granulocytes: 0.03 10*3/uL (ref 0.00–0.07)
Basophils Absolute: 0 10*3/uL (ref 0.0–0.1)
Basophils Relative: 0 %
Eosinophils Absolute: 0 10*3/uL (ref 0.0–0.5)
Eosinophils Relative: 0 %
HCT: 35.8 % — ABNORMAL LOW (ref 36.0–46.0)
Hemoglobin: 11.3 g/dL — ABNORMAL LOW (ref 12.0–15.0)
Immature Granulocytes: 0 %
Lymphocytes Relative: 24 %
Lymphs Abs: 2.2 10*3/uL (ref 0.7–4.0)
MCH: 31.7 pg (ref 26.0–34.0)
MCHC: 31.6 g/dL (ref 30.0–36.0)
MCV: 100.6 fL — ABNORMAL HIGH (ref 80.0–100.0)
Monocytes Absolute: 1.1 10*3/uL — ABNORMAL HIGH (ref 0.1–1.0)
Monocytes Relative: 12 %
Neutro Abs: 5.9 10*3/uL (ref 1.7–7.7)
Neutrophils Relative %: 64 %
Platelet Count: 415 10*3/uL — ABNORMAL HIGH (ref 150–400)
RBC: 3.56 MIL/uL — ABNORMAL LOW (ref 3.87–5.11)
RDW: 14.2 % (ref 11.5–15.5)
WBC Count: 9.3 10*3/uL (ref 4.0–10.5)
nRBC: 0 % (ref 0.0–0.2)

## 2019-07-07 MED ORDER — ONDANSETRON HCL 8 MG PO TABS
8.0000 mg | ORAL_TABLET | Freq: Once | ORAL | Status: AC
  Administered 2019-07-07: 8 mg via ORAL

## 2019-07-07 MED ORDER — SODIUM CHLORIDE 0.9% FLUSH
10.0000 mL | INTRAVENOUS | Status: DC | PRN
  Administered 2019-07-07: 10 mL via INTRAVENOUS
  Filled 2019-07-07: qty 10

## 2019-07-07 MED ORDER — SODIUM CHLORIDE 0.9% FLUSH
10.0000 mL | INTRAVENOUS | Status: DC | PRN
  Administered 2019-07-07: 10 mL
  Filled 2019-07-07: qty 10

## 2019-07-07 MED ORDER — HEPARIN SOD (PORK) LOCK FLUSH 100 UNIT/ML IV SOLN
500.0000 [IU] | Freq: Once | INTRAVENOUS | Status: AC | PRN
  Administered 2019-07-07: 500 [IU]
  Filled 2019-07-07: qty 5

## 2019-07-07 MED ORDER — SODIUM CHLORIDE 0.9 % IV SOLN
500.0000 mg/m2 | Freq: Once | INTRAVENOUS | Status: AC
  Administered 2019-07-07: 800 mg via INTRAVENOUS
  Filled 2019-07-07: qty 12

## 2019-07-07 MED ORDER — SODIUM CHLORIDE 0.9 % IV SOLN
Freq: Once | INTRAVENOUS | Status: AC
  Filled 2019-07-07: qty 250

## 2019-07-07 MED ORDER — ONDANSETRON HCL 8 MG PO TABS
ORAL_TABLET | ORAL | Status: AC
  Filled 2019-07-07: qty 1

## 2019-07-07 MED ORDER — SODIUM CHLORIDE 0.9 % IV SOLN
10.0000 mg | Freq: Once | INTRAVENOUS | Status: AC
  Administered 2019-07-07: 10 mg via INTRAVENOUS
  Filled 2019-07-07: qty 10

## 2019-07-07 NOTE — Patient Instructions (Signed)
[facility] Discharge Instructions for Patients Receiving Chemotherapy  Today you received the following chemotherapy agents Pemetrexed (ALIMTA).  To help prevent nausea and vomiting after your treatment, we encourage you to take your nausea medication as prescribed.   If you develop nausea and vomiting that is not controlled by your nausea medication, call the clinic.   BELOW ARE SYMPTOMS THAT SHOULD BE REPORTED IMMEDIATELY:  *FEVER GREATER THAN 100.5 F  *CHILLS WITH OR WITHOUT FEVER  NAUSEA AND VOMITING THAT IS NOT CONTROLLED WITH YOUR NAUSEA MEDICATION  *UNUSUAL SHORTNESS OF BREATH  *UNUSUAL BRUISING OR BLEEDING  TENDERNESS IN MOUTH AND THROAT WITH OR WITHOUT PRESENCE OF ULCERS  *URINARY PROBLEMS  *BOWEL PROBLEMS  UNUSUAL RASH Items with * indicate a potential emergency and should be followed up as soon as possible.  Feel free to call the clinic should you have any questions or concerns. The clinic phone number is [phone].  Please show the [place] at check-in to the Emergency Department and triage nurse.

## 2019-07-07 NOTE — Progress Notes (Signed)
    [name], [name], [name]  PRIOR THERAPY: 1. Status post concurrent chemoradiation with weekly carboplatin and paclitaxel, last dose was given November 18, 2005. 2. 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 3. Status post gamma knife radiotherapy to a solitary brain lesion located in the superior frontal area of the brain at [facility] in April of 2008. 4. Status post palliative radiotherapy to the lateral abdominal wall metastatic lesion under the care of Dr. Moody, completed March of 2009. 5. Status post 6 cycles of systemic chemotherapy with carboplatin and Alimta. Last dose was given July 26, 2007 with disease stabilization. 6. Gamma knife stereotactic radiotherapy to 2 brain lesions one involving the right frontal dural based as well as right parietal lesion performed on 05/07/2012 under the care of Dr. Chan at [facility].  CURRENT THERAPY: Maintenance systemic chemotherapy with Alimta 500 MG/M2 every 3 weeks, status post 205 cycles.  INTERVAL HISTORY: Danielle [name] 69 y.o. female returns to the clinic today for follow-up visit.  The patient is feeling fine with no concerning complaints.  She denied having any chest pain, shortness of breath, cough or hemoptysis.  She denied having any fever or chills.  She has no nausea, vomiting, diarrhea or constipation.  She has no headache or visual changes.  She has been tolerating her treatment with maintenance  Alimta fairly well.  She is here today for evaluation before starting cycle #206.  MEDICAL HISTORY: Past Medical History:  Diagnosis Date  . Anemia   . 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 Carcinomas, basal cell 01/22/2019   two places on face  . CVA (cerebral vascular accident) (HCC) 04/2014   pt states had 2 cva's within 2 wks  . DJD (degenerative joint disease), cervical   . Fibromyalgia   . H/O: pneumonia   . History of tobacco abuse quit 10/08   on nicotine patch  . Hypertension   . Hypokalemia   . Lung cancer (HCC) dx'd 09/2005   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 had chemo and radiation for lung ca  . Thrush 12/11/2010    ALLERGIES:  is allergic to contrast media [iodinated diagnostic agents], iohexol, sulfa drugs cross reactors, and co-trimoxazole injection [sulfamethoxazole-trimethoprim].  MEDICATIONS:  Current Outpatient Medications  Medication Sig Dispense Refill  . amLODipine (NORVASC) 10 MG tablet TAKE 1 TABLET (10 MG TOTAL) BY MOUTH DAILY. 90 tablet 3  . aspirin 325 MG EC tablet TAKE 1 TABLET BY MOUTH EVERY DAY 90 tablet 0  . atorvastatin (LIPITOR) 40 MG tablet TAKE 1 TABLET BY MOUTH EVERY DAY AT 6 PM 90 tablet 3  . diphenhydrAMINE (BENADRYL) 25 MG tablet Take 1 tablet (25 mg total) by mouth as directed. 2 hours before CT scan. 5 tablet 0  . FeFum-FePoly-FA-B Cmp-C-Biot (INTEGRA PLUS) CAPS TAKE 1 CAPSULE BY MOUTH EVERY DAY IN THE MORNING 90 capsule 3  . fluticasone (FLONASE) 50 MCG/ACT nasal spray Place 1-2 sprays into both nostrils daily as needed. 16 g 5  . folic acid (  FOLVITE) 1 MG tablet TAKE 1 TABLET BY MOUTH EVERY DAY 90 tablet 1  . furosemide (LASIX) 20 MG tablet TAKE 1 TABLET BY MOUTH EVERY DAY AS NEEDED FOR SWELLING 20 tablet 1  . lidocaine-prilocaine (EMLA) cream Apply to the Port-A-Cath site 30-60-minute before treatment. 30 g 0  . lisinopril (ZESTRIL) 20 MG tablet TAKE 2 TABLETS BY MOUTH EVERY DAY 180 tablet 3  . morphine (MS  CONTIN) 60 MG 12 hr tablet Take 1 tablet (60 mg total) by mouth 2 (two) times daily. 60 tablet 0  . morphine (MSIR) 30 MG tablet Take 1 tablet (30 mg total) by mouth every 4 (four) hours as needed (breakthrough pain). 60 tablet 0  . Olopatadine HCl 0.2 % SOLN INSTILL 1 DROP INTO AFFECTED EYE(S) EVERY DAY 2.5 mL 1  . ondansetron (ZOFRAN) 8 MG tablet TAKE 1 TABLET BY MOUTH EVERY 8 HOURS AS NEEDED FOR NAUSEA AND VOMITING 30 tablet 2  . predniSONE (DELTASONE) 50 MG tablet 1 tablet by mouth 13 hours ,7 hours and 1 hour  before CT scan 3 tablet 4  . triamcinolone (KENALOG) 0.025 % ointment Apply 1 application topically 2 (two) times daily. 30 g 0   No current facility-administered medications for this visit.   Facility-Administered Medications Ordered in Other Visits  Medication Dose Route Frequency Provider Last Rate Last Admin  . sodium chloride flush (NS) 0.9 % injection 10 mL  10 mL Intravenous PRN Mohamed, Mohamed, MD   10 mL at 07/07/19 0823    SURGICAL HISTORY:  Past Surgical History:  Procedure Laterality Date  . CERVICAL LAMINECTOMY  1995  . KNEE SURGERY  1990   Left x 2  . 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 LOOP RECORDER IMPLANT N/A 04/19/2014   Procedure: LOOP RECORDER IMPLANT;  Surgeon: James Allred, MD;  Location: [facility] CATH LAB;  Service: Cardiovascular;  Laterality: N/A;  . OTHER SURGICAL HISTORY  2008   Gamma knife surgery to remove brain met   . PORTACATH PLACEMENT  -[name]   TIP IN CAVOATRIAL JUNCTION  . TEE WITHOUT CARDIOVERSION N/A 04/19/2014   Procedure: TRANSESOPHAGEAL ECHOCARDIOGRAM (TEE);  Surgeon: Dalton S McLean, MD;  Location: [facility] ENDOSCOPY;  Service: Cardiovascular;  Laterality: N/A;    REVIEW OF SYSTEMS:  A comprehensive review of systems was negative except for: Constitutional: positive for fatigue   PHYSICAL EXAMINATION: General appearance: alert, cooperative, fatigued and no distress Head: Normocephalic, without obvious  abnormality, atraumatic Neck: no adenopathy, no JVD, supple, symmetrical, trachea midline and thyroid not enlarged, symmetric, no tenderness/mass/nodules Lymph nodes: Cervical, supraclavicular, and axillary nodes normal. Resp: clear to auscultation bilaterally Back: symmetric, no curvature. ROM normal. No CVA tenderness. Cardio: regular rate and rhythm, S1, S2 normal, no murmur, click, rub or gallop GI: soft, non-tender; bowel sounds normal; no masses,  no organomegaly Extremities: extremities normal, atraumatic, no cyanosis or edema  ECOG PERFORMANCE STATUS: 1 - Symptomatic but completely ambulatory  Blood pressure (!) 119/59, pulse 99, temperature 100 F (37.8 C), resp. rate 19, height 5' 2" (1.575 m), weight 145 lb 11.2 oz (66.1 kg).  LABORATORY DATA: Lab Results  Component Value Date   WBC 12.6 (H) 06/14/2019   HGB 12.8 06/14/2019   HCT 39.2 06/14/2019   MCV 96.6 06/14/2019   PLT 409 (H) 06/14/2019      Chemistry      Component Value Date/Time   NA 138 06/14/2019 0843   NA 142 12/24/2016 0816     K 3.4 (L) 06/14/2019 0843   K 4.4 12/24/2016 0816   CL 97 (L) 06/14/2019 0843   CL 106 06/30/2012 1004   CO2 29 06/14/2019 0843   CO2 28 12/24/2016 0816   BUN 17 06/14/2019 0843   BUN 15.8 12/24/2016 0816   CREATININE 0.90 06/14/2019 0843   CREATININE 1.1 12/24/2016 0816      Component Value Date/Time   CALCIUM 9.7 06/14/2019 0843   CALCIUM 9.6 12/24/2016 0816   ALKPHOS 76 06/14/2019 0843   ALKPHOS 80 12/24/2016 0816   AST 22 06/14/2019 0843   AST 20 12/24/2016 0816   ALT 15 06/14/2019 0843   ALT 11 12/24/2016 0816   BILITOT 0.5 06/14/2019 0843   BILITOT 0.34 12/24/2016 0816       RADIOGRAPHIC STUDIES: No results found.  ASSESSMENT AND PLAN: This is a very pleasant 69 years old white female with metastatic non-small cell lung cancer diagnosed in October 2007 status post concurrent chemoradiation followed by consolidation chemotherapy and she is currently on  maintenance treatment with single agent Alimta status post 205 cycles. She has been tolerating this treatment well with no concerning adverse effects. I recommended for her to proceed with cycle #206 today as planned. She will come back for follow-up visit in 3 weeks for evaluation before the next cycle of her treatment. For the hypertension, she will continue with her current blood pressure medication and monitor it closely at home. For pain management she is currently on MS Contin and MSIR. The patient was advised to call immediately if she has any concerning symptoms in the interval. The patient voices understanding of current disease status and treatment options and is in agreement with the current care plan. All questions were answered. The patient knows to call the clinic with any problems, questions or concerns. We can certainly see the patient much sooner if necessary.  Disclaimer: This note was dictated with voice recognition software. Similar sounding words can inadvertently be transcribed and may not be corrected upon review.

## 2019-07-14 ENCOUNTER — Telehealth: Payer: Self-pay | Admitting: Medical Oncology

## 2019-07-14 ENCOUNTER — Other Ambulatory Visit: Payer: Self-pay | Admitting: Internal Medicine

## 2019-07-14 DIAGNOSIS — C3490 Malignant neoplasm of unspecified part of unspecified bronchus or lung: Secondary | ICD-10-CM

## 2019-07-14 DIAGNOSIS — C3410 Malignant neoplasm of upper lobe, unspecified bronchus or lung: Secondary | ICD-10-CM

## 2019-07-14 MED ORDER — MORPHINE SULFATE ER 60 MG PO TBCR: 60.0000 mg | EXTENDED_RELEASE_TABLET | Freq: Two times a day (BID) | ORAL | 0 refills | Status: DC

## 2019-07-14 NOTE — Telephone Encounter (Signed)
Requested refill for [place] 60 mg.

## 2019-07-22 ENCOUNTER — Telehealth: Payer: Self-pay | Admitting: Internal Medicine

## 2019-07-22 NOTE — Telephone Encounter (Signed)
Changed time of 7/20 appt due to provider PAL. Called and spoke with patient. Confirmed change

## 2019-07-24 NOTE — Progress Notes (Signed)
[place] OFFICE PROGRESS NOTE  [name], MD [place] [address]  DIAGNOSIS: Metastatic non-small cell lung cancer initially diagnosed as locally advanced stage IIIB with a right Pancoast tumor involving the vertebral body as well as prominent canal invasion with spinal cord compression in October 2007. The patient also has metastatic disease to the brain in April 2008.  PRIOR THERAPY: THERAPY: 1. Status post concurrent chemoradiation with weekly carboplatin and paclitaxel, last dose was given November 18, 2005. 2. 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 3. Status post gamma knife radiotherapy to a solitary brain lesion located in the superior frontal area of the brain at [facility] in April of 2008. 4. Status post palliative radiotherapy to the lateral abdominal wall metastatic lesion under the care of Dr. Lisbeth Renshaw, completed March of 2009. 5. Status post 6 cycles of systemic chemotherapy with carboplatin and Alimta. Last dose was given July 26, 2007 with disease stabilization. 6. Gamma knife stereotactic radiotherapy to 2 brain lesions one involving the right frontal dural based as well as right parietal lesion performed on 05/07/2012 under the care of Dr. Vallarie Mare at [facility].  CURRENT THERAPY: Maintenance systemic chemotherapy with Alimta 500 MG/M2 every 3 weeks, status post 206 cycles.  INTERVAL HISTORY: Nanami Whitelaw Barco 69 y.o. female returns to the clinic for a follow up visit.The patient is feeling well today without any concerning complaints.She is tolerating her treatment with maintenance Alimta well without any adverse side effects except for occasional nausea/vomiting, usually occurring a few time a week. She takes zofran for nausea.She denies any fever or chills. She lost a few pounds secondary to not liking the food that is prepared for  her.She reportedly has baseline night sweats for several years, which are unchanged.She denies any hemoptysis, cough,chest pain. She reports her baseline shortness of breath with exertion.She denies any diarrhea or constipation. She denies any headaches or visual changes. She is here today for evaluationbefore starting cycle #207  MEDICAL HISTORY: Past Medical History:  Diagnosis Date   Anemia    Antineoplastic chemotherapy induced anemia 01/23/2016   Carcinomas, basal cell 01/22/2019   two places on face   CVA (cerebral vascular accident) ([place]) 04/2014   pt states had 2 cva's within 2 wks   DJD (degenerative joint disease), cervical    Fibromyalgia    H/O: pneumonia    History of tobacco abuse quit 10/08   on nicotine patch   Hypertension    Hypokalemia    Lung cancer ([place]) dx'd 09/2005   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 had chemo and radiation for lung ca   Thrush 12/11/2010    ALLERGIES:  is allergic to contrast media [iodinated diagnostic agents], iohexol, sulfa drugs cross reactors, and co-trimoxazole injection [sulfamethoxazole-trimethoprim].  MEDICATIONS:  Current Outpatient Medications  Medication Sig Dispense Refill   amLODipine (NORVASC) 10 MG tablet TAKE 1 TABLET (10 MG TOTAL) BY MOUTH DAILY. 90 tablet 3   aspirin 325 MG EC tablet TAKE 1 TABLET BY MOUTH EVERY DAY 90 tablet 0   atorvastatin (LIPITOR) 40 MG tablet TAKE 1 TABLET BY MOUTH EVERY DAY AT 6 PM 90 tablet 3   diphenhydrAMINE (BENADRYL) 25 MG tablet Take 1 tablet (25 mg total) by mouth as directed. 2 hours before CT scan. 5 tablet 0   FeFum-FePoly-FA-B Cmp-C-Biot (INTEGRA PLUS) CAPS TAKE 1 CAPSULE BY MOUTH EVERY DAY IN THE MORNING 90 capsule  3   fluticasone (FLONASE) 50 MCG/ACT nasal spray Place 1-2 sprays into both nostrils daily as needed. 16 g 5   folic acid (FOLVITE) 1 MG tablet TAKE 1 TABLET BY MOUTH EVERY DAY 90 tablet 1   furosemide (LASIX) 20 MG tablet TAKE 1 TABLET BY  MOUTH EVERY DAY AS NEEDED FOR SWELLING 20 tablet 1   lidocaine-prilocaine (EMLA) cream Apply to the Port-A-Cath site 30-60-minute before treatment. 30 g 0   lisinopril (ZESTRIL) 20 MG tablet TAKE 2 TABLETS BY MOUTH EVERY DAY 180 tablet 3   morphine (MS CONTIN) 60 MG 12 hr tablet Take 1 tablet (60 mg total) by mouth 2 (two) times daily. 60 tablet 0   morphine (MSIR) 30 MG tablet Take 1 tablet (30 mg total) by mouth every 4 (four) hours as needed (breakthrough pain). 60 tablet 0   Olopatadine HCl 0.2 % SOLN INSTILL 1 DROP INTO AFFECTED EYE(S) EVERY DAY 2.5 mL 1   ondansetron (ZOFRAN) 8 MG tablet TAKE 1 TABLET BY MOUTH EVERY 8 HOURS AS NEEDED FOR NAUSEA AND VOMITING 30 tablet 2   predniSONE (DELTASONE) 50 MG tablet 1 tablet by mouth 13 hours ,7 hours and 1 hour  before CT scan 3 tablet 4   triamcinolone (KENALOG) 0.025 % ointment Apply 1 application topically 2 (two) times daily. 30 g 0   No current facility-administered medications for this visit.   Facility-Administered Medications Ordered in Other Visits  Medication Dose Route Frequency Provider Last Rate Last Admin   cyanocobalamin ((VITAMIN B-12)) injection 1,000 mcg  1,000 mcg Intramuscular Once Curt Bears, MD       dexamethasone (DECADRON) 10 mg in sodium chloride 0.9 % 50 mL IVPB  10 mg Intravenous Once Curt Bears, MD       heparin lock flush 100 unit/mL  500 Units Intracatheter Once PRN Curt Bears, MD       ondansetron [facility]) tablet 8 mg  8 mg Oral Once Curt Bears, MD       PEMEtrexed (ALIMTA) 800 mg in sodium chloride 0.9 % 100 mL chemo infusion  500 mg/m2 (Order-Specific) Intravenous Once Curt Bears, MD       sodium chloride flush (NS) 0.9 % injection 10 mL  10 mL Intracatheter PRN Curt Bears, MD        SURGICAL HISTORY:  Past Surgical History:  Procedure Laterality Date   [place]   Left x 2   KYPHOSIS SURGERY  7/08   because lung ca  grew into spinal canal   LOOP RECORDER IMPLANT N/A 04/19/2014   Procedure: LOOP RECORDER IMPLANT;  Surgeon: Thompson Grayer, MD;  Location: [facility] CATH LAB;  Service: Cardiovascular;  Laterality: N/A;   OTHER SURGICAL HISTORY  2008   Gamma knife surgery to remove brain met    PORTACATH PLACEMENT  -[name]   TIP IN CAVOATRIAL JUNCTION   TEE WITHOUT CARDIOVERSION N/A 04/19/2014   Procedure: TRANSESOPHAGEAL ECHOCARDIOGRAM (TEE);  Surgeon: Larey Dresser, MD;  Location: [facility];  Service: Cardiovascular;  Laterality: N/A;    REVIEW OF SYSTEMS:   Review of Systems Constitutional: Negative for appetite change, chills, fatigue, fever and unexpected weight change.  HENT:Negative for mouth sores, nosebleeds, sore throat and trouble swallowing.  Eyes: Negative for eye problems and icterus.  Respiratory:Positive for baseline shortness of breath with exertion.Negative for cough, hemoptysis, and wheezing.  Cardiovascular: Negative for chest pain and leg swelling.  Gastrointestinal:Positive for occasional nausea and vomiting.Negative for  abdominal pain, constipation, and diarrhea. Genitourinary: Negative for bladder incontinence, difficulty urinating, dysuria, frequency and hematuria.  Musculoskeletal: Negative for back pain, gait problem, neck pain and neck stiffness.  Skin:Positive for several scars on her skin.Negative for itching and rash.  Neurological: Negative for dizziness, extremity weakness, gait problem, headaches, light-headedness and seizures.  Hematological: Negative for adenopathy. Does not bruise/bleed easily.  Psychiatric/Behavioral: Negative for confusion, depression and sleep disturbance. The patient is not nervous/anxious.     PHYSICAL EXAMINATION:  Blood pressure (!) 147/72, pulse (!) 109, temperature 98.2 F (36.8 C), temperature source Temporal, resp. rate (!) 24, weight 140 lb (63.5 kg), SpO2 98 %.  ECOG PERFORMANCE STATUS: 1 - Symptomatic but completely  ambulatory  Physical Exam  Constitutional: Oriented to person, place, and time and well-developed, well-nourished, and in no distress.  HENT:  Head: Normocephalic and atraumatic.  Mouth/Throat: Oropharynx is clear and moist. No oropharyngeal exudate.  Eyes: Conjunctivae are normal. Right eye exhibits no discharge. Left eye exhibits no discharge. No scleral icterus.  Neck: Normal range of motion. Neck supple.  Cardiovascular: Normal rate, regular rhythm, normal heart sounds and intact distal pulses.  Pulmonary/Chest: Effort normal and breath soundsquiet in all lung fields.No respiratory distress. No wheezes. No rales.  Abdominal: Soft. Bowel sounds are normal. Exhibits no distension and no mass. There is no tenderness.  Musculoskeletal: Normal range of motion. Exhibits no edema.  Lymphadenopathy:  No cervical adenopathy.  Neurological: Alert and oriented to person, place, and time. Exhibits normal muscle tone. Gait normal. Coordination normal.  Skin: Skin is warm and dry. No rash noted. Not diaphoretic. No erythema. No pallor.  Psychiatric: Mood, memory and judgment normal.  Vitals reviewed.  LABORATORY DATA: Lab Results  Component Value Date   WBC 12.2 (H) 07/27/2019   HGB 12.2 07/27/2019   HCT 37.7 07/27/2019   MCV 97.4 07/27/2019   PLT 383 07/27/2019      Chemistry      Component Value Date/Time   NA 140 07/27/2019 0827   NA 142 12/24/2016 0816   K 3.9 07/27/2019 0827   K 4.4 12/24/2016 0816   CL 107 07/27/2019 0827   CL 106 06/30/2012 1004   CO2 22 07/27/2019 0827   CO2 28 12/24/2016 0816   BUN 9 07/27/2019 0827   BUN 15.8 12/24/2016 0816   CREATININE 0.79 07/27/2019 0827   CREATININE 1.03 (H) 07/07/2019 0825   CREATININE 1.1 12/24/2016 0816      Component Value Date/Time   CALCIUM 10.0 07/27/2019 0827   CALCIUM 9.6 12/24/2016 0816   ALKPHOS 79 07/27/2019 0827   ALKPHOS 80 12/24/2016 0816   AST 23 07/27/2019 0827   AST 20 07/07/2019 0825   AST 20  12/24/2016 0816   ALT 11 07/27/2019 0827   ALT 11 07/07/2019 0825   ALT 11 12/24/2016 0816   BILITOT 0.4 07/27/2019 0827   BILITOT 0.4 07/07/2019 0825   BILITOT 0.34 12/24/2016 0816       RADIOGRAPHIC STUDIES:  No results found.   ASSESSMENT/PLAN:  This isa very pleasant 69 year old Caucasian female with metastatic non-small cell lung cancer who was initially diagnosed in October 2007. She is status post current chemoradiation followed by consolidation chemotherapy.  She is currently undergoing weeklymaintainancechemotherapy with single agent Alimta 500 mg/m. She is status post206cycles.She is tolerating treatment well without any concerning complaintsexcept mild nausea/vomiting.  Labs were reviewed. Recommend that she proceed with cycle #207 today as scheduled.   We will see her back for a follow  up visit 3 weeks for evaluation before starting cycle #208.  The patient was advised to call immediately if she has any concerning symptoms in the interval. The patient voices understanding of current disease status and treatment options and is in agreement with the current care plan. All questions were answered. The patient knows to call the clinic with any problems, questions or concerns. We can certainly see the patient much sooner if necessary     No orders of the defined types were placed in this encounter.    Tima Curet L Anitria Andon, PA-C 07/27/19

## 2019-07-27 ENCOUNTER — Inpatient Hospital Stay: Payer: Medicare Other | Admitting: Internal Medicine

## 2019-07-27 ENCOUNTER — Inpatient Hospital Stay: Payer: Medicare Other

## 2019-07-27 ENCOUNTER — Inpatient Hospital Stay: Payer: Medicare Other | Attending: Physician Assistant

## 2019-07-27 ENCOUNTER — Other Ambulatory Visit: Payer: Self-pay | Admitting: Physician Assistant

## 2019-07-27 ENCOUNTER — Other Ambulatory Visit: Payer: Self-pay

## 2019-07-27 ENCOUNTER — Inpatient Hospital Stay (HOSPITAL_BASED_OUTPATIENT_CLINIC_OR_DEPARTMENT_OTHER): Payer: Medicare Other | Admitting: Physician Assistant

## 2019-07-27 ENCOUNTER — Telehealth: Payer: Self-pay | Admitting: Physician Assistant

## 2019-07-27 VITALS — BP 147/72 | HR 109 | Temp 98.2°F | Resp 24 | Wt 140.0 lb

## 2019-07-27 VITALS — HR 92

## 2019-07-27 DIAGNOSIS — C3490 Malignant neoplasm of unspecified part of unspecified bronchus or lung: Secondary | ICD-10-CM

## 2019-07-27 DIAGNOSIS — Z79899 Other long term (current) drug therapy: Secondary | ICD-10-CM | POA: Insufficient documentation

## 2019-07-27 DIAGNOSIS — Z7952 Long term (current) use of systemic steroids: Secondary | ICD-10-CM | POA: Diagnosis not present

## 2019-07-27 DIAGNOSIS — Z5111 Encounter for antineoplastic chemotherapy: Secondary | ICD-10-CM

## 2019-07-27 DIAGNOSIS — M797 Fibromyalgia: Secondary | ICD-10-CM | POA: Diagnosis not present

## 2019-07-27 DIAGNOSIS — C3411 Malignant neoplasm of upper lobe, right bronchus or lung: Secondary | ICD-10-CM

## 2019-07-27 DIAGNOSIS — Z95828 Presence of other vascular implants and grafts: Secondary | ICD-10-CM

## 2019-07-27 DIAGNOSIS — Z923 Personal history of irradiation: Secondary | ICD-10-CM | POA: Diagnosis not present

## 2019-07-27 DIAGNOSIS — Z7982 Long term (current) use of aspirin: Secondary | ICD-10-CM | POA: Insufficient documentation

## 2019-07-27 DIAGNOSIS — I10 Essential (primary) hypertension: Secondary | ICD-10-CM | POA: Insufficient documentation

## 2019-07-27 DIAGNOSIS — Z8673 Personal history of transient ischemic attack (TIA), and cerebral infarction without residual deficits: Secondary | ICD-10-CM | POA: Insufficient documentation

## 2019-07-27 DIAGNOSIS — C7931 Secondary malignant neoplasm of brain: Secondary | ICD-10-CM | POA: Insufficient documentation

## 2019-07-27 LAB — COMPREHENSIVE METABOLIC PANEL
ALT: 11 U/L (ref 0–44)
AST: 23 U/L (ref 15–41)
Albumin: 3.2 g/dL — ABNORMAL LOW (ref 3.5–5.0)
Alkaline Phosphatase: 79 U/L (ref 38–126)
Anion gap: 11 (ref 5–15)
BUN: 9 mg/dL (ref 8–23)
CO2: 22 mmol/L (ref 22–32)
Calcium: 10 mg/dL (ref 8.9–10.3)
Chloride: 107 mmol/L (ref 98–111)
Creatinine, Ser: 0.79 mg/dL (ref 0.44–1.00)
GFR calc Af Amer: >60 mL/min (ref >60)
GFR calc non Af Amer: >60 mL/min (ref >60)
Glucose, Bld: 178 mg/dL — ABNORMAL HIGH (ref 70–99)
Potassium: 3.9 mmol/L (ref 3.5–5.1)
Sodium: 140 mmol/L (ref 135–145)
Total Bilirubin: 0.4 mg/dL (ref 0.3–1.2)
Total Protein: 7.5 g/dL (ref 6.5–8.1)

## 2019-07-27 LAB — CBC WITH DIFFERENTIAL/PLATELET
Abs Immature Granulocytes: 0.07 10*3/uL (ref 0.00–0.07)
Basophils Absolute: 0 10*3/uL (ref 0.0–0.1)
Basophils Relative: 0 %
Eosinophils Absolute: 0 10*3/uL (ref 0.0–0.5)
Eosinophils Relative: 0 %
HCT: 37.7 % (ref 36.0–46.0)
Hemoglobin: 12.2 g/dL (ref 12.0–15.0)
Immature Granulocytes: 1 %
Lymphocytes Relative: 16 %
Lymphs Abs: 1.9 10*3/uL (ref 0.7–4.0)
MCH: 31.5 pg (ref 26.0–34.0)
MCHC: 32.4 g/dL (ref 30.0–36.0)
MCV: 97.4 fL (ref 80.0–100.0)
Monocytes Absolute: 1 10*3/uL (ref 0.1–1.0)
Monocytes Relative: 8 %
Neutro Abs: 9.1 10*3/uL — ABNORMAL HIGH (ref 1.7–7.7)
Neutrophils Relative %: 75 %
Platelets: 383 10*3/uL (ref 150–400)
RBC: 3.87 MIL/uL (ref 3.87–5.11)
RDW: 13.6 % (ref 11.5–15.5)
WBC: 12.2 10*3/uL — ABNORMAL HIGH (ref 4.0–10.5)
nRBC: 0 % (ref 0.0–0.2)

## 2019-07-27 MED ORDER — SODIUM CHLORIDE 0.9% FLUSH
10.0000 mL | INTRAVENOUS | Status: DC | PRN
  Administered 2019-07-27: 10 mL
  Filled 2019-07-27: qty 10

## 2019-07-27 MED ORDER — CYANOCOBALAMIN 1000 MCG/ML IJ SOLN
1000.0000 ug | Freq: Once | INTRAMUSCULAR | Status: AC
  Administered 2019-07-27: 1000 ug via INTRAMUSCULAR

## 2019-07-27 MED ORDER — HEPARIN SOD (PORK) LOCK FLUSH 100 UNIT/ML IV SOLN
500.0000 [IU] | Freq: Once | INTRAVENOUS | Status: AC | PRN
  Administered 2019-07-27: 500 [IU]
  Filled 2019-07-27: qty 5

## 2019-07-27 MED ORDER — ONDANSETRON HCL 8 MG PO TABS
ORAL_TABLET | ORAL | Status: AC
  Filled 2019-07-27: qty 1

## 2019-07-27 MED ORDER — SODIUM CHLORIDE 0.9% FLUSH
10.0000 mL | INTRAVENOUS | Status: DC | PRN
  Administered 2019-07-27: 10 mL via INTRAVENOUS
  Filled 2019-07-27: qty 10

## 2019-07-27 MED ORDER — ONDANSETRON HCL 8 MG PO TABS
8.0000 mg | ORAL_TABLET | Freq: Once | ORAL | Status: AC
  Administered 2019-07-27: 8 mg via ORAL

## 2019-07-27 MED ORDER — SODIUM CHLORIDE 0.9 % IV SOLN
500.0000 mg/m2 | Freq: Once | INTRAVENOUS | Status: AC
  Administered 2019-07-27: 800 mg via INTRAVENOUS
  Filled 2019-07-27: qty 20

## 2019-07-27 MED ORDER — SODIUM CHLORIDE 0.9 % IV SOLN
Freq: Once | INTRAVENOUS | Status: AC
  Filled 2019-07-27: qty 250

## 2019-07-27 MED ORDER — CYANOCOBALAMIN 1000 MCG/ML IJ SOLN
INTRAMUSCULAR | Status: AC
  Filled 2019-07-27: qty 1

## 2019-07-27 MED ORDER — SODIUM CHLORIDE 0.9 % IV SOLN
10.0000 mg | Freq: Once | INTRAVENOUS | Status: AC
  Administered 2019-07-27: 10 mg via INTRAVENOUS
  Filled 2019-07-27: qty 10

## 2019-07-27 NOTE — Telephone Encounter (Signed)
Scheduled per 07/20 los, patient has received updated calender.

## 2019-07-27 NOTE — Patient Instructions (Signed)
[place] Discharge Instructions for Patients Receiving Chemotherapy  Today you received the following chemotherapy agents Pemetrexed (ALIMTA).  To help prevent nausea and vomiting after your treatment, we encourage you to take your nausea medication as prescribed.   If you develop nausea and vomiting that is not controlled by your nausea medication, call the clinic.   BELOW ARE SYMPTOMS THAT SHOULD BE REPORTED IMMEDIATELY:  *FEVER GREATER THAN 100.5 F  *CHILLS WITH OR WITHOUT FEVER  NAUSEA AND VOMITING THAT IS NOT CONTROLLED WITH YOUR NAUSEA MEDICATION  *UNUSUAL SHORTNESS OF BREATH  *UNUSUAL BRUISING OR BLEEDING  TENDERNESS IN MOUTH AND THROAT WITH OR WITHOUT PRESENCE OF ULCERS  *URINARY PROBLEMS  *BOWEL PROBLEMS  UNUSUAL RASH Items with * indicate a potential emergency and should be followed up as soon as possible.  Feel free to call the clinic should you have any questions or concerns. The clinic phone number is [phone].  Please show the [facility] at check-in to the Emergency Department and triage nurse.

## 2019-07-30 ENCOUNTER — Other Ambulatory Visit: Payer: Self-pay | Admitting: Physician Assistant

## 2019-07-30 DIAGNOSIS — C7931 Secondary malignant neoplasm of brain: Secondary | ICD-10-CM

## 2019-08-11 ENCOUNTER — Other Ambulatory Visit: Payer: Self-pay | Admitting: Internal Medicine

## 2019-08-17 ENCOUNTER — Inpatient Hospital Stay: Payer: Medicare Other

## 2019-08-17 ENCOUNTER — Inpatient Hospital Stay (HOSPITAL_BASED_OUTPATIENT_CLINIC_OR_DEPARTMENT_OTHER): Payer: Medicare Other | Admitting: Internal Medicine

## 2019-08-17 ENCOUNTER — Inpatient Hospital Stay: Payer: Medicare Other | Attending: Physician Assistant | Admitting: Internal Medicine

## 2019-08-17 ENCOUNTER — Encounter: Payer: Self-pay | Admitting: Internal Medicine

## 2019-08-17 ENCOUNTER — Other Ambulatory Visit: Payer: Self-pay

## 2019-08-17 VITALS — BP 142/58 | HR 99 | Temp 96.5°F | Resp 20 | Ht 62.0 in | Wt 141.9 lb

## 2019-08-17 DIAGNOSIS — M7989 Other specified soft tissue disorders: Secondary | ICD-10-CM | POA: Insufficient documentation

## 2019-08-17 DIAGNOSIS — Z923 Personal history of irradiation: Secondary | ICD-10-CM | POA: Diagnosis not present

## 2019-08-17 DIAGNOSIS — Z5111 Encounter for antineoplastic chemotherapy: Secondary | ICD-10-CM | POA: Diagnosis not present

## 2019-08-17 DIAGNOSIS — C3411 Malignant neoplasm of upper lobe, right bronchus or lung: Secondary | ICD-10-CM

## 2019-08-17 DIAGNOSIS — D6481 Anemia due to antineoplastic chemotherapy: Secondary | ICD-10-CM | POA: Diagnosis not present

## 2019-08-17 DIAGNOSIS — I10 Essential (primary) hypertension: Secondary | ICD-10-CM | POA: Insufficient documentation

## 2019-08-17 DIAGNOSIS — M797 Fibromyalgia: Secondary | ICD-10-CM | POA: Diagnosis not present

## 2019-08-17 DIAGNOSIS — R634 Abnormal weight loss: Secondary | ICD-10-CM | POA: Diagnosis not present

## 2019-08-17 DIAGNOSIS — C7931 Secondary malignant neoplasm of brain: Secondary | ICD-10-CM | POA: Insufficient documentation

## 2019-08-17 DIAGNOSIS — M1990 Unspecified osteoarthritis, unspecified site: Secondary | ICD-10-CM | POA: Diagnosis not present

## 2019-08-17 DIAGNOSIS — Z87891 Personal history of nicotine dependence: Secondary | ICD-10-CM | POA: Insufficient documentation

## 2019-08-17 DIAGNOSIS — C3410 Malignant neoplasm of upper lobe, unspecified bronchus or lung: Secondary | ICD-10-CM | POA: Diagnosis not present

## 2019-08-17 DIAGNOSIS — Z9221 Personal history of antineoplastic chemotherapy: Secondary | ICD-10-CM | POA: Diagnosis not present

## 2019-08-17 DIAGNOSIS — Z79899 Other long term (current) drug therapy: Secondary | ICD-10-CM | POA: Diagnosis not present

## 2019-08-17 DIAGNOSIS — R0602 Shortness of breath: Secondary | ICD-10-CM | POA: Diagnosis not present

## 2019-08-17 DIAGNOSIS — T451X5A Adverse effect of antineoplastic and immunosuppressive drugs, initial encounter: Secondary | ICD-10-CM | POA: Diagnosis not present

## 2019-08-17 DIAGNOSIS — C3490 Malignant neoplasm of unspecified part of unspecified bronchus or lung: Secondary | ICD-10-CM

## 2019-08-17 DIAGNOSIS — Z8673 Personal history of transient ischemic attack (TIA), and cerebral infarction without residual deficits: Secondary | ICD-10-CM | POA: Insufficient documentation

## 2019-08-17 LAB — COMPREHENSIVE METABOLIC PANEL
ALT: 10 U/L (ref 0–44)
AST: 24 U/L (ref 15–41)
Albumin: 3.5 g/dL (ref 3.5–5.0)
Alkaline Phosphatase: 79 U/L (ref 38–126)
Anion gap: 8 (ref 5–15)
BUN: 12 mg/dL (ref 8–23)
CO2: 26 mmol/L (ref 22–32)
Calcium: 10.2 mg/dL (ref 8.9–10.3)
Chloride: 106 mmol/L (ref 98–111)
Creatinine, Ser: 0.9 mg/dL (ref 0.44–1.00)
GFR calc Af Amer: >60 mL/min (ref >60)
GFR calc non Af Amer: >60 mL/min (ref >60)
Glucose, Bld: 146 mg/dL — ABNORMAL HIGH (ref 70–99)
Potassium: 4.5 mmol/L (ref 3.5–5.1)
Sodium: 140 mmol/L (ref 135–145)
Total Bilirubin: 0.4 mg/dL (ref 0.3–1.2)
Total Protein: 7.4 g/dL (ref 6.5–8.1)

## 2019-08-17 LAB — CBC WITH DIFFERENTIAL/PLATELET
Abs Immature Granulocytes: 0.05 10*3/uL (ref 0.00–0.07)
Basophils Absolute: 0.1 10*3/uL (ref 0.0–0.1)
Basophils Relative: 0 %
Eosinophils Absolute: 0 10*3/uL (ref 0.0–0.5)
Eosinophils Relative: 0 %
HCT: 40.2 % (ref 36.0–46.0)
Hemoglobin: 12.4 g/dL (ref 12.0–15.0)
Immature Granulocytes: 0 %
Lymphocytes Relative: 13 %
Lymphs Abs: 1.8 10*3/uL (ref 0.7–4.0)
MCH: 30.2 pg (ref 26.0–34.0)
MCHC: 30.8 g/dL (ref 30.0–36.0)
MCV: 98 fL (ref 80.0–100.0)
Monocytes Absolute: 1 10*3/uL (ref 0.1–1.0)
Monocytes Relative: 7 %
Neutro Abs: 10.8 10*3/uL — ABNORMAL HIGH (ref 1.7–7.7)
Neutrophils Relative %: 80 %
Platelets: 398 10*3/uL (ref 150–400)
RBC: 4.1 MIL/uL (ref 3.87–5.11)
RDW: 14.4 % (ref 11.5–15.5)
WBC: 13.8 10*3/uL — ABNORMAL HIGH (ref 4.0–10.5)
nRBC: 0 % (ref 0.0–0.2)

## 2019-08-17 MED ORDER — SODIUM CHLORIDE 0.9 % IV SOLN
500.0000 mg/m2 | Freq: Once | INTRAVENOUS | Status: AC
  Administered 2019-08-17: 800 mg via INTRAVENOUS
  Filled 2019-08-17: qty 20

## 2019-08-17 MED ORDER — FUROSEMIDE 20 MG PO TABS: ORAL_TABLET | ORAL | 1 refills | Status: DC

## 2019-08-17 MED ORDER — ONDANSETRON HCL 8 MG PO TABS
ORAL_TABLET | ORAL | Status: AC
  Filled 2019-08-17: qty 1

## 2019-08-17 MED ORDER — SODIUM CHLORIDE 0.9% FLUSH
10.0000 mL | INTRAVENOUS | Status: DC | PRN
  Administered 2019-08-17: 10 mL
  Filled 2019-08-17: qty 10

## 2019-08-17 MED ORDER — SODIUM CHLORIDE 0.9 % IV SOLN
Freq: Once | INTRAVENOUS | Status: AC
  Filled 2019-08-17: qty 250

## 2019-08-17 MED ORDER — ONDANSETRON HCL 8 MG PO TABS
8.0000 mg | ORAL_TABLET | Freq: Once | ORAL | Status: AC
  Administered 2019-08-17: 8 mg via ORAL

## 2019-08-17 MED ORDER — CYANOCOBALAMIN 1000 MCG/ML IJ SOLN
INTRAMUSCULAR | Status: AC
  Filled 2019-08-17: qty 1

## 2019-08-17 MED ORDER — HEPARIN SOD (PORK) LOCK FLUSH 100 UNIT/ML IV SOLN
500.0000 [IU] | Freq: Once | INTRAVENOUS | Status: AC | PRN
  Administered 2019-08-17: 500 [IU]
  Filled 2019-08-17: qty 5

## 2019-08-17 MED ORDER — MORPHINE SULFATE 30 MG PO TABS: 30.0000 mg | ORAL_TABLET | ORAL | 0 refills | Status: DC | PRN

## 2019-08-17 MED ORDER — SODIUM CHLORIDE 0.9 % IV SOLN
10.0000 mg | Freq: Once | INTRAVENOUS | Status: AC
  Administered 2019-08-17: 10 mg via INTRAVENOUS
  Filled 2019-08-17: qty 10

## 2019-08-17 MED ORDER — MORPHINE SULFATE ER 60 MG PO TBCR: 60.0000 mg | EXTENDED_RELEASE_TABLET | Freq: Two times a day (BID) | ORAL | 0 refills | Status: DC

## 2019-08-17 MED ORDER — CYANOCOBALAMIN 1000 MCG/ML IJ SOLN
1000.0000 ug | Freq: Once | INTRAMUSCULAR | Status: AC
  Administered 2019-08-17: 1000 ug via INTRAMUSCULAR

## 2019-08-17 NOTE — Progress Notes (Signed)
[place] [phone]   [phone]  OFFICE PROGRESS NOTE  [name], MD [place] [address]  DIAGNOSIS: Metastatic non-small cell lung cancer initially diagnosed as locally advanced stage IIIB with a right Pancoast tumor involving the vertebral body as well as prominent canal invasion with spinal cord compression in October 2007. The patient also has metastatic disease to the brain in April 2008.  PRIOR THERAPY: 1. Status post concurrent chemoradiation with weekly carboplatin and paclitaxel, last dose was given November 18, 2005. 2. 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 3. Status post gamma knife radiotherapy to a solitary brain lesion located in the superior frontal area of the brain at [facility] in April of 2008. 4. Status post palliative radiotherapy to the lateral abdominal wall metastatic lesion under the care of Dr. Lisbeth Renshaw, completed March of 2009. 5. Status post 6 cycles of systemic chemotherapy with carboplatin and Alimta. Last dose was given July 26, 2007 with disease stabilization. 6. Gamma knife stereotactic radiotherapy to 2 brain lesions one involving the right frontal dural based as well as right parietal lesion performed on 05/07/2012 under the care of Dr. Vallarie Mare at [facility].  CURRENT THERAPY: Maintenance systemic chemotherapy with Alimta 500 MG/M2 every 3 weeks, status post 207 cycles.  INTERVAL HISTORY: Danielle [name] 69 y.o. female returns to the clinic today for follow-up visit.  The patient is feeling fine today with no concerning complaints except for mild fatigue.  She enjoyed a week of vacation with her family recently.  She denied having any current chest pain, shortness of breath, cough or hemoptysis.  She denied having any nausea, vomiting, diarrhea or constipation.  She has no headache or visual  changes.  She is here today for evaluation before starting cycle #208 of her maintenance therapy.    MEDICAL HISTORY: Past Medical History:  Diagnosis Date   Anemia    Antineoplastic chemotherapy induced anemia 01/23/2016   Carcinomas, basal cell 01/22/2019   two places on face   CVA (cerebral vascular accident) ([place]) 04/2014   pt states had 2 cva's within 2 wks   DJD (degenerative joint disease), cervical    Fibromyalgia    H/O: pneumonia    History of tobacco abuse quit 10/08   on nicotine patch   Hypertension    Hypokalemia    Lung cancer ([place]) dx'd 09/2005   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 had chemo and radiation for lung ca   Thrush 12/11/2010    ALLERGIES:  is allergic to contrast media [iodinated diagnostic agents], iohexol, sulfa drugs cross reactors, and co-trimoxazole injection [sulfamethoxazole-trimethoprim].  MEDICATIONS:  Current Outpatient Medications  Medication Sig Dispense Refill   amLODipine (NORVASC) 10 MG tablet TAKE 1 TABLET (10 MG TOTAL) BY MOUTH DAILY. 90 tablet 3   aspirin 325 MG EC tablet TAKE 1 TABLET BY MOUTH EVERY DAY 90 tablet 0   atorvastatin (LIPITOR) 40 MG tablet TAKE 1 TABLET BY MOUTH EVERY DAY AT 6 PM 90 tablet 3   diphenhydrAMINE (BENADRYL) 25 MG tablet Take 1 tablet (25 mg total) by mouth as directed. 2 hours before CT scan. 5 tablet 0   FeFum-FePoly-FA-B Cmp-C-Biot (INTEGRA PLUS) CAPS TAKE 1 CAPSULE BY MOUTH EVERY DAY IN THE MORNING 90 capsule 3   fluticasone (FLONASE) 50 MCG/ACT nasal spray Place 1-2 sprays into both nostrils daily as needed. 16 g 5  folic acid (FOLVITE) 1 MG tablet TAKE 1 TABLET BY MOUTH EVERY DAY 90 tablet 1   furosemide (LASIX) 20 MG tablet TAKE 1 TABLET BY MOUTH EVERY DAY AS NEEDED FOR SWELLING 20 tablet 1   lidocaine-prilocaine (EMLA) cream Apply to the Port-A-Cath site 30-60-minute before treatment. 30 g 0   lisinopril (ZESTRIL) 20 MG tablet TAKE 2 TABLETS BY MOUTH EVERY DAY 180 tablet 3    morphine (MS CONTIN) 60 MG 12 hr tablet Take 1 tablet (60 mg total) by mouth 2 (two) times daily. 60 tablet 0   morphine (MSIR) 30 MG tablet Take 1 tablet (30 mg total) by mouth every 4 (four) hours as needed (breakthrough pain). 60 tablet 0   Olopatadine HCl 0.2 % SOLN INSTILL 1 DROP INTO AFFECTED EYE(S) EVERY DAY 2.5 mL 1   ondansetron (ZOFRAN) 8 MG tablet TAKE 1 TABLET BY MOUTH EVERY 8 HOURS AS NEEDED FOR NAUSEA AND VOMITING 30 tablet 2   predniSONE (DELTASONE) 50 MG tablet 1 tablet by mouth 13 hours ,7 hours and 1 hour  before CT scan 3 tablet 4   triamcinolone (KENALOG) 0.025 % ointment Apply 1 application topically 2 (two) times daily. 30 g 0   No current facility-administered medications for this visit.    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   LOOP RECORDER IMPLANT N/A 04/19/2014   Procedure: LOOP RECORDER IMPLANT;  Surgeon: Thompson Grayer, MD;  Location: [facility] CATH LAB;  Service: Cardiovascular;  Laterality: N/A;   OTHER SURGICAL HISTORY  2008   Gamma knife surgery to remove brain met    PORTACATH PLACEMENT  -[name]   TIP IN CAVOATRIAL JUNCTION   TEE WITHOUT CARDIOVERSION N/A 04/19/2014   Procedure: TRANSESOPHAGEAL ECHOCARDIOGRAM (TEE);  Surgeon: Larey Dresser, MD;  Location: [facility];  Service: Cardiovascular;  Laterality: N/A;    REVIEW OF SYSTEMS:  A comprehensive review of systems was negative except for: Constitutional: positive for fatigue   PHYSICAL EXAMINATION: General appearance: alert, cooperative, fatigued and no distress Head: Normocephalic, without obvious abnormality, atraumatic Neck: no adenopathy, no JVD, supple, symmetrical, trachea midline and thyroid not enlarged, symmetric, no tenderness/mass/nodules Lymph nodes: Cervical, supraclavicular, and axillary nodes normal. Resp: clear to  auscultation bilaterally Back: symmetric, no curvature. ROM normal. No CVA tenderness. Cardio: regular rate and rhythm, S1, S2 normal, no murmur, click, rub or gallop GI: soft, non-tender; bowel sounds normal; no masses,  no organomegaly Extremities: extremities normal, atraumatic, no cyanosis or edema  ECOG PERFORMANCE STATUS: 1 - Symptomatic but completely ambulatory  Blood pressure (!) 142/58, pulse 99, temperature (!) 96.5 F (35.8 C), temperature source Tympanic, resp. rate 20, height _0  (1.575 m), weight 141 lb 14.4 oz (64.4 kg), SpO2 95 %.  LABORATORY DATA: Lab Results  Component Value Date   WBC 13.8 (H) 08/17/2019   HGB 12.4 08/17/2019   HCT 40.2 08/17/2019   MCV 98.0 08/17/2019   PLT 398 08/17/2019      Chemistry      Component Value Date/Time   NA 140 07/27/2019 0827   NA 142 12/24/2016 0816   K 3.9 07/27/2019 0827   K 4.4 12/24/2016 0816   CL 107 07/27/2019 0827   CL 106 06/30/2012 1004   CO2 22 07/27/2019 0827   CO2 28 12/24/2016 0816  BUN 9 07/27/2019 0827   BUN 15.8 12/24/2016 0816   CREATININE 0.79 07/27/2019 0827   CREATININE 1.03 (H) 07/07/2019 0825   CREATININE 1.1 12/24/2016 0816      Component Value Date/Time   CALCIUM 10.0 07/27/2019 0827   CALCIUM 9.6 12/24/2016 0816   ALKPHOS 79 07/27/2019 0827   ALKPHOS 80 12/24/2016 0816   AST 23 07/27/2019 0827   AST 20 07/07/2019 0825   AST 20 12/24/2016 0816   ALT 11 07/27/2019 0827   ALT 11 07/07/2019 0825   ALT 11 12/24/2016 0816   BILITOT 0.4 07/27/2019 0827   BILITOT 0.4 07/07/2019 0825   BILITOT 0.34 12/24/2016 0816       RADIOGRAPHIC STUDIES: No results found.  ASSESSMENT AND PLAN: This is a very pleasant 69 years old white female with metastatic non-small cell lung cancer diagnosed in October 2007 status post concurrent chemoradiation followed by consolidation chemotherapy and she is currently on maintenance treatment with single agent Alimta status post 207 cycles. The patient  continues to tolerate her treatment well with no concerning adverse effects. I recommended for her to proceed with cycle #208 today as planned. For the pain management, she will continue on MS Contin and MSIR and I will give her a refill of her medication. For the swelling of the lower extremities, I will give her a refill for Lasix to be used on as-needed basis.   The patient voices understanding of current disease status and treatment options and is in agreement with the current care plan. All questions were answered. The patient knows to call the clinic with any problems, questions or concerns. We can certainly see the patient much sooner if necessary.  Disclaimer: This note was dictated with voice recognition software. Similar sounding words can inadvertently be transcribed and may not be corrected upon review.

## 2019-08-17 NOTE — Progress Notes (Signed)
No critical labs need to be addressed urgently. We will discuss labs in detail at upcoming office visit.   

## 2019-08-17 NOTE — Patient Instructions (Signed)
[place] Discharge Instructions for Patients Receiving Chemotherapy  Today you received the following chemotherapy agents: pemetrexed.  To help prevent nausea and vomiting after your treatment, we encourage you to take your nausea medication as directed.   If you develop nausea and vomiting that is not controlled by your nausea medication, call the clinic.   BELOW ARE SYMPTOMS THAT SHOULD BE REPORTED IMMEDIATELY:  *FEVER GREATER THAN 100.5 F  *CHILLS WITH OR WITHOUT FEVER  NAUSEA AND VOMITING THAT IS NOT CONTROLLED WITH YOUR NAUSEA MEDICATION  *UNUSUAL SHORTNESS OF BREATH  *UNUSUAL BRUISING OR BLEEDING  TENDERNESS IN MOUTH AND THROAT WITH OR WITHOUT PRESENCE OF ULCERS  *URINARY PROBLEMS  *BOWEL PROBLEMS  UNUSUAL RASH Items with * indicate a potential emergency and should be followed up as soon as possible.  Feel free to call the clinic should you have any questions or concerns. The clinic phone number is [phone].  Please show the [facility] at check-in to the Emergency Department and triage nurse.

## 2019-08-20 ENCOUNTER — Other Ambulatory Visit: Payer: Medicare Other

## 2019-08-26 ENCOUNTER — Ambulatory Visit: Payer: Medicare Other | Admitting: Family Medicine

## 2019-08-30 ENCOUNTER — Ambulatory Visit: Payer: Medicare Other

## 2019-08-31 ENCOUNTER — Other Ambulatory Visit: Payer: Self-pay

## 2019-08-31 ENCOUNTER — Ambulatory Visit (INDEPENDENT_AMBULATORY_CARE_PROVIDER_SITE_OTHER): Payer: Medicare Other

## 2019-08-31 VITALS — Wt 141.0 lb

## 2019-08-31 DIAGNOSIS — Z0000 Encounter for general adult medical examination without abnormal findings: Secondary | ICD-10-CM | POA: Diagnosis not present

## 2019-08-31 NOTE — Progress Notes (Signed)
PCP notes:  Health Maintenance: Colonoscopy- declined Mammogram- declined Dexa- declined Foot exam- due Flu- Patient states she received this at the cancer center this month COVID- will bring card so we can document in chart Shingrix- declined   Abnormal Screenings: none   Patient concerns: none   Nurse concerns: none   Next PCP appt.: 09/03/2019 @ 10 am

## 2019-08-31 NOTE — Patient Instructions (Signed)
Danielle [name] , Thank you for taking time to come for your Medicare Wellness Visit. I appreciate your ongoing commitment to your health goals. Please review the following plan we discussed and let me know if I can assist you in the future.   Screening recommendations/referrals: Colonoscopy: declined Mammogram: declined Bone Density: declined Recommended yearly ophthalmology/optometry visit for glaucoma screening and checkup Recommended yearly dental visit for hygiene and checkup  Vaccinations: Influenza vaccine: up to date per patient, state you received this at the cancer center this month Pneumococcal vaccine: Completed series Tdap vaccine: decline- insurance/financial Shingles vaccine: due, check with your insurance regarding coverage    Covid-19:Completed series, please bring card to physical so we can document in your chart  Advanced directives: Advance directive discussed with you today. Even though you declined this today please call our office should you change your mind and we can give you the proper paperwork for you to fill out.   Conditions/risks identified: diabetes, hyperlipidemia  Next appointment: Follow up in one year for your annual wellness visit    Preventive Care 99 Years and Older, Female Preventive care refers to lifestyle choices and visits with your health care provider that can promote health and wellness. What does preventive care include?  A yearly physical exam. This is also called an annual well check.  Dental exams once or twice a year.  Routine eye exams. Ask your health care provider how often you should have your eyes checked.  Personal lifestyle choices, including:  Daily care of your teeth and gums.  Regular physical activity.  Eating a healthy diet.  Avoiding tobacco and drug use.  Limiting alcohol use.  Practicing safe sex.  Taking low-dose aspirin every day.  Taking vitamin and mineral supplements as recommended by your health  care provider. What happens during an annual well check? The services and screenings done by your health care provider during your annual well check will depend on your age, overall health, lifestyle risk factors, and family history of disease. Counseling  Your health care provider may ask you questions about your:  Alcohol use.  Tobacco use.  Drug use.  Emotional well-being.  Home and relationship well-being.  Sexual activity.  Eating habits.  History of falls.  Memory and ability to understand (cognition).  Work and work Statistician.  Reproductive health. Screening  You may have the following tests or measurements:  Height, weight, and BMI.  Blood pressure.  Lipid and cholesterol levels. These may be checked every 5 years, or more frequently if you are over 49 years old.  Skin check.  Lung cancer screening. You may have this screening every year starting at age 7 if you have a 30-pack-year history of smoking and currently smoke or have quit within the past 15 years.  Fecal occult blood test (FOBT) of the stool. You may have this test every year starting at age 37.  Flexible sigmoidoscopy or colonoscopy. You may have a sigmoidoscopy every 5 years or a colonoscopy every 10 years starting at age 62.  Hepatitis C blood test.  Hepatitis B blood test.  Sexually transmitted disease (STD) testing.  Diabetes screening. This is done by checking your blood sugar (glucose) after you have not eaten for a while (fasting). You may have this done every 1-3 years.  Bone density scan. This is done to screen for osteoporosis. You may have this done starting at age 40.  Mammogram. This may be done every 1-2 years. Talk to your health care provider about  how often you should have regular mammograms. Talk with your health care provider about your test results, treatment options, and if necessary, the need for more tests. Vaccines  Your health care provider may recommend certain  vaccines, such as:  Influenza vaccine. This is recommended every year.  Tetanus, diphtheria, and acellular pertussis (Tdap, Td) vaccine. You may need a Td booster every 10 years.  Zoster vaccine. You may need this after age 67.  Pneumococcal 13-valent conjugate (PCV13) vaccine. One dose is recommended after age 1.  Pneumococcal polysaccharide (PPSV23) vaccine. One dose is recommended after age 67. Talk to your health care provider about which screenings and vaccines you need and how often you need them. This information is not intended to replace advice given to you by your health care provider. Make sure you discuss any questions you have with your health care provider. Document Released: 01/20/2015 Document Revised: 09/13/2015 Document Reviewed: 10/25/2014 Elsevier Interactive Patient Education  2017 [place] Prevention in the Home Falls can cause injuries. They can happen to people of all ages. There are many things you can do to make your home safe and to help prevent falls. What can I do on the outside of my home?  Regularly fix the edges of walkways and driveways and fix any cracks.  Remove anything that might make you trip as you walk through a door, such as a raised step or threshold.  Trim any bushes or trees on the path to your home.  Use bright outdoor lighting.  Clear any walking paths of anything that might make someone trip, such as rocks or tools.  Regularly check to see if handrails are loose or broken. Make sure that both sides of any steps have handrails.  Any raised decks and porches should have guardrails on the edges.  Have any leaves, snow, or ice cleared regularly.  Use sand or salt on walking paths during winter.  Clean up any spills in your garage right away. This includes oil or grease spills. What can I do in the bathroom?  Use night lights.  Install grab bars by the toilet and in the tub and shower. Do not use towel bars as grab  bars.  Use non-skid mats or decals in the tub or shower.  If you need to sit down in the shower, use a plastic, non-slip stool.  Keep the floor dry. Clean up any water that spills on the floor as soon as it happens.  Remove soap buildup in the tub or shower regularly.  Attach bath mats securely with double-sided non-slip rug tape.  Do not have throw rugs and other things on the floor that can make you trip. What can I do in the bedroom?  Use night lights.  Make sure that you have a light by your bed that is easy to reach.  Do not use any sheets or blankets that are too big for your bed. They should not hang down onto the floor.  Have a firm chair that has side arms. You can use this for support while you get dressed.  Do not have throw rugs and other things on the floor that can make you trip. What can I do in the kitchen?  Clean up any spills right away.  Avoid walking on wet floors.  Keep items that you use a lot in easy-to-reach places.  If you need to reach something above you, use a strong step stool that has a grab bar.  Keep  electrical cords out of the way.  Do not use floor polish or wax that makes floors slippery. If you must use wax, use non-skid floor wax.  Do not have throw rugs and other things on the floor that can make you trip. What can I do with my stairs?  Do not leave any items on the stairs.  Make sure that there are handrails on both sides of the stairs and use them. Fix handrails that are broken or loose. Make sure that handrails are as long as the stairways.  Check any carpeting to make sure that it is firmly attached to the stairs. Fix any carpet that is loose or worn.  Avoid having throw rugs at the top or bottom of the stairs. If you do have throw rugs, attach them to the floor with carpet tape.  Make sure that you have a light switch at the top of the stairs and the bottom of the stairs. If you do not have them, ask someone to add them for  you. What else can I do to help prevent falls?  Wear shoes that:  Do not have high heels.  Have rubber bottoms.  Are comfortable and fit you well.  Are closed at the toe. Do not wear sandals.  If you use a stepladder:  Make sure that it is fully opened. Do not climb a closed stepladder.  Make sure that both sides of the stepladder are locked into place.  Ask someone to hold it for you, if possible.  Clearly mark and make sure that you can see:  Any grab bars or handrails.  First and last steps.  Where the edge of each step is.  Use tools that help you move around (mobility aids) if they are needed. These include:  Canes.  Walkers.  Scooters.  Crutches.  Turn on the lights when you go into a dark area. Replace any light bulbs as soon as they burn out.  Set up your furniture so you have a clear path. Avoid moving your furniture around.  If any of your floors are uneven, fix them.  If there are any pets around you, be aware of where they are.  Review your medicines with your doctor. Some medicines can make you feel dizzy. This can increase your chance of falling. Ask your doctor what other things that you can do to help prevent falls. This information is not intended to replace advice given to you by your health care provider. Make sure you discuss any questions you have with your health care provider. Document Released: 10/20/2008 Document Revised: 06/01/2015 Document Reviewed: 01/28/2014 Elsevier Interactive Patient Education  2017 [employer].

## 2019-08-31 NOTE — Progress Notes (Signed)
Subjective:   Danielle [name] is a 69 y.o. female who presents for Medicare Annual (Subsequent) preventive examination.  Review of Systems: N/A      I connected with the patient today by telephone and verified that I am speaking with the correct person using two identifiers. Location patient: home Location nurse: work Persons participating in the telephone visit: patient, nurse.   I discussed the limitations, risks, security and privacy concerns of performing an evaluation and management service by telephone and the availability of in person appointments. I also discussed with the patient that there may be a patient responsible charge related to this service. The patient expressed understanding and verbally consented to this telephonic visit.        Cardiac Risk Factors include: advanced age (>29mn, >>79women);diabetes mellitus;dyslipidemia     Objective:    Today's Vitals   08/31/19 1145 08/31/19 1158  Weight: 141 lb (64 kg)   PainSc:  7    Body mass index is 25.79 kg/m.  Advanced Directives 08/31/2019 08/17/2019 06/14/2019 05/25/2019 04/13/2019 03/23/2019 03/02/2019  Does Patient Have a Medical Advance Directive? _0  No No  Would patient like information on creating a medical advance directive? No - Patient declined No - Patient declined No - Patient declined No - Patient declined No - Patient declined No - Patient declined No - Patient declined    Current Medications (verified) Outpatient Encounter Medications as of 08/31/2019  Medication Sig  . amLODipine (NORVASC) 10 MG tablet TAKE 1 TABLET (10 MG TOTAL) BY MOUTH DAILY.  .Marland Kitchenaspirin 325 MG EC tablet TAKE 1 TABLET BY MOUTH EVERY DAY  . atorvastatin (LIPITOR) 40 MG tablet TAKE 1 TABLET BY MOUTH EVERY DAY AT 6 PM  . diphenhydrAMINE (BENADRYL) 25 MG tablet Take 1 tablet (25 mg total) by mouth as directed. 2 hours before CT scan.  . FeFum-FePoly-FA-B Cmp-C-Biot (INTEGRA PLUS) CAPS TAKE 1 CAPSULE BY MOUTH EVERY DAY IN THE  MORNING  . fluticasone (FLONASE) 50 MCG/ACT nasal spray Place 1-2 sprays into both nostrils daily as needed.  . folic acid (FOLVITE) 1 MG tablet TAKE 1 TABLET BY MOUTH EVERY DAY  . furosemide (LASIX) 20 MG tablet TAKE 1 TABLET BY MOUTH EVERY DAY AS NEEDED FOR SWELLING  . lidocaine-prilocaine (EMLA) cream Apply to the Port-A-Cath site 30-60-minute before treatment.  .Marland Kitchenlisinopril (ZESTRIL) 20 MG tablet TAKE 2 TABLETS BY MOUTH EVERY DAY  . morphine (MS CONTIN) 60 MG 12 hr tablet Take 1 tablet (60 mg total) by mouth 2 (two) times daily.  .Marland Kitchenmorphine (MSIR) 30 MG tablet Take 1 tablet (30 mg total) by mouth every 4 (four) hours as needed (breakthrough pain).  . Olopatadine HCl 0.2 % SOLN INSTILL 1 DROP INTO AFFECTED EYE(S) EVERY DAY  . ondansetron (ZOFRAN) 8 MG tablet TAKE 1 TABLET BY MOUTH EVERY 8 HOURS AS NEEDED FOR NAUSEA AND VOMITING  . predniSONE (DELTASONE) 50 MG tablet 1 tablet by mouth 13 hours ,7 hours and 1 hour  before CT scan  . triamcinolone (KENALOG) 0.025 % ointment Apply 1 application topically 2 (two) times daily.  . [DISCONTINUED] ondansetron (ZOFRAN) 8 MG tablet TAKE 1 TABLET BY MOUTH EVERY 8 HOURS AS NEEDED FOR NAUSEA AND VOMITING   No facility-administered encounter medications on file as of 08/31/2019.    Allergies (verified) Contrast media [iodinated diagnostic agents], Iohexol, Sulfa drugs cross reactors, and Co-trimoxazole injection [sulfamethoxazole-trimethoprim]   History: Past Medical History:  Diagnosis Date  . Anemia   .  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 Carcinomas, basal cell 01/22/2019   two places on face  . CVA (cerebral vascular accident) ([place]) 04/2014   pt states had 2 cva's within 2 wks  . DJD (degenerative joint disease), cervical   . Fibromyalgia   . H/O: pneumonia   . History of tobacco abuse quit 10/08   on nicotine patch  . Hypertension   . Hypokalemia   . 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 had chemo and radiation for lung ca  . Thrush 12/11/2010   Past Surgical History:  Procedure Laterality Date  . CERVICAL LAMINECTOMY  1995  . KNEE SURGERY  1990   Left x 2  . 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 LOOP RECORDER IMPLANT N/A 04/19/2014   Procedure: LOOP RECORDER IMPLANT;  Surgeon: Thompson Grayer, MD;  Location: [facility] CATH LAB;  Service: Cardiovascular;  Laterality: N/A;  . OTHER SURGICAL HISTORY  2008   Gamma knife surgery to remove brain met   . PORTACATH PLACEMENT  -[name]   TIP IN CAVOATRIAL JUNCTION  . TEE WITHOUT CARDIOVERSION N/A 04/19/2014   Procedure: TRANSESOPHAGEAL ECHOCARDIOGRAM (TEE);  Surgeon: Larey Dresser, MD;  [facility] ENDOSCOPY;  Service: Cardiovascular;  Laterality: N/A;   Family History  Problem Relation Age of Onset  . Diabetes Father   . Lymphoma Father   . Stroke Father   . Liver cancer Mother   . Lung cancer Mother   . Cancer Mother   . Cancer Sister 46       ovarian cancer  . Hyperlipidemia Brother   . Healthy Daughter   . Healthy Son   . Healthy Son    Social History   Socioeconomic History  . Marital status: Divorced    Spouse name: Not on file  . Number of children: 3  . Years of education: Not on file  . Highest education level: Not on file  Occupational History  . Occupation: DISABLED    Employer: UNEMPLOYED  Tobacco Use  . Smoking status: Former Smoker    Packs/day: 2.00    Years: 30.00    Pack years: 60.00    Quit date: 10/07/2005    Years since quitting: 13.9  . Smokeless tobacco: Never Used  Vaping Use  . Vaping Use: Never used  Substance and Sexual Activity  . Alcohol use: No  . Drug use: No  . Sexual activity: Never  Other Topics Concern  . Not on file  Social History Narrative   Divorced (alcoholic)    Regular exercise: yes, walking   Diet: fruits and veggies, walking    No living will,  HCPOA (son Cruz Condon III), DNR (reviewed 2014)               Social  Determinants of Health   Financial Resource Strain: Low Risk   . Difficulty of Paying Living Expenses: Not hard at all  Food Insecurity: No Food Insecurity  . Worried About Charity fundraiser in the Last Year: Never true  . Ran Out of Food in the Last Year: Never true  Transportation Needs: No Transportation Needs  . Lack of Transportation (Medical): No  . Lack of Transportation (Non-Medical): No  Physical Activity: Inactive  . Days of Exercise per Week: 0 days  . Minutes of Exercise per Session: 0 min  Stress: No Stress Concern Present  . Feeling  of Stress : Not at all  Social Connections:   . Frequency of Communication with Friends and Family: Not on file  . Frequency of Social Gatherings with Friends and Family: Not on file  . Attends Religious Services: Not on file  . Active Member of Clubs or Organizations: Not on file  . Attends Archivist Meetings: Not on file  . Marital Status: Not on file    Tobacco Counseling Counseling given: Not Answered   Clinical Intake:  Pre-visit preparation completed: Yes  Pain : 0-10 Pain Score: 7  Pain Type: Chronic pain Pain Location: Back Pain Descriptors / Indicators: Aching Pain Onset: More than a month ago Pain Frequency: Constant     Nutritional Risks: None Diabetes: Yes CBG done?: No Did pt. bring in CBG monitor from home?: No  How often do you need to have someone help you when you read instructions, pamphlets, or other written materials from your doctor or pharmacy?: 1 - Never What is the last grade level you completed in school?: 12th  Diabetic: yes  Nutrition Risk Assessment:  Has the patient had any N/V/D within the last 2 months?  No  Does the patient have any non-healing wounds?  No  Has the patient had any unintentional weight loss or weight gain?  No   Diabetes:  Is the patient diabetic?  Yes  If diabetic, was a CBG obtained today?  No  Did the patient bring in their glucometer from home?  No   How often do you monitor your CBG's? When needed.   Financial Strains and Diabetes Management:  Are you having any financial strains with the device, your supplies or your medication? No .  Does the patient want to be seen by Chronic Care Management for management of their diabetes?  No  Would the patient like to be referred to a Nutritionist or for Diabetic Management?  No   Diabetic Exams:  Diabetic Eye Exam: Completed 07/01/2019 Diabetic Foot Exam: Overdue, Pt has been advised about the importance in completing this exam. Pt is scheduled for diabetic foot exam on 09/03/2019.   Interpreter Needed?: No  Information entered by :: CJohnson, LPN   Activities of Daily Living In your present state of health, do you have any difficulty performing the following activities: 08/31/2019  Hearing? N  Vision? N  Difficulty concentrating or making decisions? N  Walking or climbing stairs? N  Dressing or bathing? N  Doing errands, shopping? N  Preparing Food and eating ? N  Using the Toilet? N  In the past six months, have you accidently leaked urine? N  Do you have problems with loss of bowel control? N  Managing your Medications? N  Managing your Finances? N  Housekeeping or managing your Housekeeping? N  Some recent data might be hidden    Patient Care Team: [name], MD as PCP - General Curt Bears, MD as Consulting Physician (Oncology) Debbora Dus, [facility] as Pharmacist (Pharmacist)  Indicate any recent Medical Services you may have received from other than [facility] providers in the past year (date may be approximate).     Assessment:   This is a routine wellness examination for Marnita.  Hearing/Vision screen  Hearing Screening   _0  _1  _2  _3  _4  _5  _6  _7  _8   Right ear:           Left ear:           Vision Screening Comments: Patient gets annual eye exams.  Dietary issues and exercise activities discussed: Current Exercise  Habits: The patient does not participate in regular exercise at present, Exercise limited by: None identified  Goals    . Increase physical activity     Starting 07/27/15, I will attempt to walk for at least 30 min daily as tolerated.     . Patient Stated     Starting 08/05/2017, I will continue to take medications as prescribed.     . Patient Stated     08/31/2019, I will maintain and continue medications as prescribed.     . Pharmacy Care Plan     CARE PLAN ENTRY  Current Barriers:  . Chronic Disease Management support, education, and care coordination needs related to hypertension, hyperlipidemia  Pharmacist Clinical Goal(s):  Marland Kitchen Over the next 6 months, patient will work with PharmD and primary care provider to address the following goals: o Hypertension: Maintain blood pressure goal of less than 140/90 mmHg o Cholesterol: Maintain LDL cholesterol within goal of less than 70 mg/dL  Interventions: . Comprehensive medication review performed . Reviewed vaccine recommendations: Shingrix 2-dose series, COVID-19 vaccine  Patient Self Care Activities:  For the next 6 months until follow up visit:  . Monitor blood pressure weekly and contact office if persistent low blood pressure readings  . Adhere to heart healthy diet such as the DASH diet   Initial goal documentation      Depression Screen PHQ 2/9 Scores 08/31/2019 08/28/2018 08/05/2017 08/09/2016 07/27/2015 04/26/2014  PHQ - 2 Score 0 0 0 0 1 6  PHQ- 9 Score 0 - 0 - - -    Fall Risk Fall Risk  08/31/2019 08/28/2018 08/05/2017 08/09/2016 05/08/2016  Falls in the past year? 0 0 No No No  Number falls in past yr: 0 - - - -  Injury with Fall? 0 - - - -  Risk for fall due to : Medication side effect - - - -  Follow up Falls evaluation completed;Falls prevention discussed - - - -    Any stairs in or around the home? Yes  If so, are there any without handrails? No  Home free of loose throw rugs in walkways, pet beds, electrical cords,  etc? Yes  Adequate lighting in your home to reduce risk of falls? Yes   ASSISTIVE DEVICES UTILIZED TO PREVENT FALLS:  Life alert? No  Use of a cane, walker or w/c? No  Grab bars in the bathroom? No  Shower chair or bench in shower? No  Elevated toilet seat or a handicapped toilet? No   TIMED UP AND GO:  Was the test performed? N/A, telephonic visit .    Cognitive Function: MMSE - Mini Mental State Exam 08/31/2019 08/05/2017 07/27/2015  Orientation to time _0 Orientation to Place _1 Registration _2 Attention/ Calculation 5 0 0  Recall _3 Recall-comments - - pt was unable to recall 1 of 3 words  Language- name 2 objects - 0 0  Language- repeat _4 Language- follow 3 step command - 3 3  Language- read & follow direction - 0 0  Write a sentence - 0 0  Copy design - 0 0  Total score - 20 19  Mini Cog  Mini-Cog screen was completed. Maximum score is 22. A value of 0 denotes this part of the MMSE was not completed or the patient failed this part of the Mini-Cog screening.  Immunizations Immunization History  Administered Date(s) Administered  . Influenza Split 10/01/2011  . Influenza Whole 11/08/2007, 12/07/2008  . Influenza,inj,Quad PF,6+ Mos 10/13/2012, 09/14/2013, 09/27/2014, 10/01/2016, 09/23/2017, 09/15/2018  . Pneumococcal Conjugate-13 08/28/2018  . Pneumococcal Polysaccharide-23 10/16/2005, 02/12/2011  . Td 01/07/2002, 01/08/2008    TDAP status: Due, Education has been provided regarding the importance of this vaccine. Advised may receive this vaccine at local pharmacy or Health Dept. Aware to provide a copy of the vaccination record if obtained from local pharmacy or Health Dept. Verbalized acceptance and understanding. Flu Vaccine status: Up to date, Patient states that she received this at the cancer center this month. Date unknown Pneumococcal vaccine status: Up to date Covid-19 vaccine status: Completed vaccines, will bring card to  physical  Qualifies for Shingles Vaccine? Yes   Zostavax completed No   Shingrix Completed?: No.    Education has been provided regarding the importance of this vaccine. Patient has been advised to call insurance company to determine out of pocket expense if they have not yet received this vaccine. Advised may also receive vaccine at local pharmacy or Health Dept. Verbalized acceptance and understanding.  Screening Tests Health Maintenance  Topic Date Due  . COVID-19 Vaccine (1) Never done  . HEMOGLOBIN A1C  08/19/2019  . FOOT EXAM  08/28/2019  . PNA vac Low Risk Adult (2 of 2 - PPSV23) 08/28/2019  . INFLUENZA VACCINE  10/20/2019 (Originally 08/08/2019)  . MAMMOGRAM  07/27/2023 (Originally 06/21/2010)  . DEXA SCAN  08/31/2023 (Originally 07/24/2017)  . COLONOSCOPY  08/31/2023 (Originally 06/21/2018)  . TETANUS/TDAP  08/31/2023 (Originally 01/07/2018)  . OPHTHALMOLOGY EXAM  06/30/2020  . Hepatitis C Screening  Completed    Health Maintenance  Health Maintenance Due  Topic Date Due  . COVID-19 Vaccine (1) Never done  . HEMOGLOBIN A1C  08/19/2019  . FOOT EXAM  08/28/2019  . PNA vac Low Risk Adult (2 of 2 - PPSV23) 08/28/2019    Colorectal cancer screening: declined Mammogram status: declined Bone Density status: declined  Lung Cancer Screening: (Low Dose CT Chest recommended if Age 33-80 years, 30 pack-year currently smoking OR have quit w/in 15years.) does not qualify.    Additional Screening:  Hepatitis C Screening: does qualify; Completed 07/27/2015  Vision Screening: Recommended annual ophthalmology exams for early detection of glaucoma and other disorders of the eye. Is the patient up to date with their annual eye exam?  Yes  Who is the provider or what is the name of the office in which the patient attends annual eye exams? [facility] If pt is not established with a provider, would they like to be referred to a provider to establish care? No .   Dental Screening:  Recommended annual dental exams for proper oral hygiene  Community Resource Referral / Chronic Care Management: CRR required this visit?  No   CCM required this visit?  No      Plan:     I have personally reviewed and noted the following in the patient's chart:   . Medical and social history . Use of alcohol, tobacco or illicit drugs  . Current medications and supplements . Functional ability and status . Nutritional status . Physical activity . Advanced directives . List of other physicians . Hospitalizations, surgeries, and ER visits in previous 12 months . Vitals . Screenings to include cognitive, depression, and falls . Referrals and appointments  In addition, I have reviewed and discussed with patient certain preventive protocols, quality metrics, and best practice recommendations. A  written personalized care plan for preventive services as well as general preventive health recommendations were provided to patient.   Due to this being a telephonic visit, the after visit summary with patients personalized plan was offered to patient via mail or my-chart. Patient preferred to pick up at office at next visit.   Andrez Grime, LPN   [id number]

## 2019-09-03 ENCOUNTER — Other Ambulatory Visit: Payer: Self-pay

## 2019-09-03 ENCOUNTER — Encounter: Payer: Self-pay | Admitting: Family Medicine

## 2019-09-03 ENCOUNTER — Other Ambulatory Visit: Payer: Self-pay | Admitting: Family Medicine

## 2019-09-03 ENCOUNTER — Ambulatory Visit (INDEPENDENT_AMBULATORY_CARE_PROVIDER_SITE_OTHER): Payer: Medicare Other | Admitting: Family Medicine

## 2019-09-03 VITALS — BP 140/60 | HR 94 | Temp 98.3°F | Ht 62.75 in | Wt 137.2 lb

## 2019-09-03 DIAGNOSIS — I152 Hypertension secondary to endocrine disorders: Secondary | ICD-10-CM

## 2019-09-03 DIAGNOSIS — E119 Type 2 diabetes mellitus without complications: Secondary | ICD-10-CM

## 2019-09-03 DIAGNOSIS — I10 Essential (primary) hypertension: Secondary | ICD-10-CM

## 2019-09-03 DIAGNOSIS — E1159 Type 2 diabetes mellitus with other circulatory complications: Secondary | ICD-10-CM | POA: Diagnosis not present

## 2019-09-03 DIAGNOSIS — E785 Hyperlipidemia, unspecified: Secondary | ICD-10-CM

## 2019-09-03 DIAGNOSIS — Z23 Encounter for immunization: Secondary | ICD-10-CM | POA: Diagnosis not present

## 2019-09-03 DIAGNOSIS — J449 Chronic obstructive pulmonary disease, unspecified: Secondary | ICD-10-CM | POA: Insufficient documentation

## 2019-09-03 DIAGNOSIS — E2839 Other primary ovarian failure: Secondary | ICD-10-CM | POA: Diagnosis not present

## 2019-09-03 DIAGNOSIS — E1169 Type 2 diabetes mellitus with other specified complication: Secondary | ICD-10-CM

## 2019-09-03 DIAGNOSIS — I700 Atherosclerosis of aorta: Secondary | ICD-10-CM

## 2019-09-03 DIAGNOSIS — C3490 Malignant neoplasm of unspecified part of unspecified bronchus or lung: Secondary | ICD-10-CM

## 2019-09-03 LAB — POCT GLYCOSYLATED HEMOGLOBIN (HGB A1C): Hemoglobin A1C: 5.4 % (ref 4.0–5.6)

## 2019-09-03 LAB — HM DIABETES FOOT EXAM

## 2019-09-03 MED ORDER — SPIRIVA HANDIHALER 18 MCG IN CAPS: 18.0000 ug | ORAL_CAPSULE | Freq: Every day | RESPIRATORY_TRACT | 12 refills | Status: DC

## 2019-09-03 MED ORDER — ALBUTEROL SULFATE (2.5 MG/3ML) 0.083% IN NEBU: 2.5000 mg | INHALATION_SOLUTION | Freq: Four times a day (QID) | RESPIRATORY_TRACT | 1 refills | Status: DC | PRN

## 2019-09-03 NOTE — Assessment & Plan Note (Signed)
Well controlled. Continue current medication.  

## 2019-09-03 NOTE — Assessment & Plan Note (Signed)
Not currently on controller.   Will try a trial of Spiriva with albuterol for rescue.

## 2019-09-03 NOTE — Progress Notes (Signed)
[facility] OFFICE PROGRESS NOTE  Daniel[place], [place] [id number]  DIAGNOSIS: Metastatic non-small cell lung cancer initially diagnosed as locally advanced stage IIIB with a right Pancoast tumor involving the vertebral body as well as prominent canal invasion with spinal cord compression in October 2007. The patient also has metastatic disease to the brain in April 2008.  PRIOR THERAPY: 1. Status post concurrent chemoradiation with weekly carboplatin and paclitaxel, last dose was given November 18, 2005. 2. 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 3. Status post gamma knife radiotherapy to a solitary brain lesion located in the superior frontal area of the brain at [facility] in April of 2008. 4. Status post palliative radiotherapy to the lateral abdominal wall metastatic lesion under the care of Dr. Lisbeth Renshaw, completed March of 2009. 5. Status post 6 cycles of systemic chemotherapy with carboplatin and Alimta. Last dose was given July 26, 2007 with disease stabilization. 6. Gamma knife stereotactic radiotherapy to 2 brain lesions one involving the right frontal dural based as well as right parietal lesion performed on 05/07/2012 under the care of Dr. Vallarie Mare at [facility].  CURRENT THERAPY: Maintenance systemic chemotherapy with Alimta 500 MG/M2 every 3 weeks, status post 208cycles.  INTERVAL HISTORY: Danielle [name] 69 y.o. female returns to the clinic for a follow up visit.The patient is feeling well today without any concerning complaints.She is tolerating her treatment with maintenance Alimta well without any adverse side effects except for occasional nausea/vomiting, usually occurring a few time a week. She takes zofran for nausea.She denies any fever or chills. She lost some weight which she attributes to her daughter in laws cooking.She reportedly has  baseline night sweats for several years, which are unchanged.She denies any hemoptysis, cough,chest pain. She reports her baseline shortness of breath with exertion.She denies any diarrhea or constipation. She denies any headaches or visual changes. She is inquiring about the COVID-19 booster vaccine and shingles vaccine. She had a friend pass away from COVID-19 last week. She is here today for evaluationbeforestarting cycle #209  MEDICAL HISTORY: Past Medical History:  Diagnosis Date  . Anemia   . 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 Carcinomas, basal cell 01/22/2019   two places on face  . CVA (cerebral vascular accident) ([place]) 04/2014   pt states had 2 cva's within 2 wks  . DJD (degenerative joint disease), cervical   . Fibromyalgia   . H/O: pneumonia   . History of tobacco abuse quit 10/08   on nicotine patch  . Hypertension   . Hypokalemia   . Lung cancer ([place]) dx'd 09/2005   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 had chemo and radiation for lung ca  . Thrush 12/11/2010    ALLERGIES:  is allergic to contrast media [iodinated diagnostic agents], iohexol, sulfa drugs cross reactors, and co-trimoxazole injection [sulfamethoxazole-trimethoprim].  MEDICATIONS:  Current Outpatient Medications  Medication Sig Dispense Refill  . albuterol (PROVENTIL) (2.5 MG/3ML) 0.083% nebulizer solution Take 3 mLs (2.5 mg total) by nebulization every 6 (six) hours as needed for wheezing or shortness of breath. 150 mL 1  . amLODipine (NORVASC) 10 MG tablet TAKE 1 TABLET (10 MG TOTAL) BY MOUTH DAILY. 90 tablet 3  . aspirin 325 MG EC tablet TAKE 1 TABLET BY MOUTH EVERY DAY 90 tablet 0  . atorvastatin (LIPITOR) 40 MG tablet TAKE 1 TABLET BY MOUTH EVERY DAY AT 6 PM  90 tablet 3  . diphenhydrAMINE (BENADRYL) 25 MG tablet Take 1 tablet (25 mg total) by mouth as directed. 2 hours before CT scan. 5 tablet 0  . FeFum-FePoly-FA-B Cmp-C-Biot (INTEGRA PLUS) CAPS TAKE 1 CAPSULE BY MOUTH  EVERY DAY IN THE MORNING 90 capsule 3  . fluticasone (FLONASE) 50 MCG/ACT nasal spray Place 1-2 sprays into both nostrils daily as needed. 16 g 5  . folic acid (FOLVITE) 1 MG tablet TAKE 1 TABLET BY MOUTH EVERY DAY 90 tablet 1  . furosemide (LASIX) 20 MG tablet TAKE 1 TABLET BY MOUTH EVERY DAY AS NEEDED FOR SWELLING 20 tablet 1  . lidocaine-prilocaine (EMLA) cream Apply to the Port-A-Cath site 30-60-minute before treatment. 30 g 0  . lisinopril (ZESTRIL) 20 MG tablet TAKE 2 TABLETS BY MOUTH EVERY DAY 180 tablet 3  . morphine (MS CONTIN) 60 MG 12 hr tablet Take 1 tablet (60 mg total) by mouth 2 (two) times daily. 60 tablet 0  . morphine (MSIR) 30 MG tablet Take 1 tablet (30 mg total) by mouth every 4 (four) hours as needed (breakthrough pain). 60 tablet 0  . Olopatadine HCl 0.2 % SOLN INSTILL 1 DROP INTO AFFECTED EYE(S) EVERY DAY 2.5 mL 1  . predniSONE (DELTASONE) 50 MG tablet 1 tablet by mouth 13 hours ,7 hours and 1 hour  before CT scan 3 tablet 4  . triamcinolone (KENALOG) 0.025 % ointment Apply 1 application topically 2 (two) times daily. 30 g 0  . umeclidinium bromide (INCRUSE ELLIPTA) 62.5 MCG/INH AEPB Inhale 1 puff into the lungs daily. 1 each 11   No current facility-administered medications for this visit.    SURGICAL HISTORY:  Past Surgical History:  Procedure Laterality Date  . CERVICAL LAMINECTOMY  1995  . KNEE SURGERY  1990   Left x 2  . 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 LOOP RECORDER IMPLANT N/A 04/19/2014   Procedure: LOOP RECORDER IMPLANT;  Surgeon: Thompson Grayer, MD;  Location: [facility] CATH LAB;  Service: Cardiovascular;  Laterality: N/A;  . OTHER SURGICAL HISTORY  2008   Gamma knife surgery to remove brain met   . PORTACATH PLACEMENT  -[name]   TIP IN CAVOATRIAL JUNCTION  . TEE WITHOUT CARDIOVERSION N/A 04/19/2014   Procedure: TRANSESOPHAGEAL ECHOCARDIOGRAM (TEE);  Surgeon: Larey Dresser, MD;  Location: [facility];  Service:  Cardiovascular;  Laterality: N/A;    REVIEW OF SYSTEMS:   Review of Systems  Constitutional: Negative for appetite change, chills, fatigue, fever and unexpected weight change.  HENT:Negative for mouth sores, nosebleeds, sore throat and trouble swallowing.  Eyes: Negative for eye problems and icterus.  Respiratory:Positive for baseline shortness of breath with exertion.Negative for cough, hemoptysis, and wheezing.  Cardiovascular: Negative for chest pain and leg swelling.  Gastrointestinal:Positive for occasional nausea and vomiting.Negative for abdominal pain, constipation, and diarrhea. Genitourinary: Negative for bladder incontinence, difficulty urinating, dysuria, frequency and hematuria.  Musculoskeletal: Negative for back pain, gait problem, neck pain and neck stiffness.  Skin:Positive for several scars on her skin.Negative for itching and rash.  Neurological: Negative for dizziness, extremity weakness, gait problem, headaches, light-headedness and seizures.  Hematological: Negative for adenopathy. Does not bruise/bleed easily.  Psychiatric/Behavioral: Negative for confusion, depression and sleep disturbance. The patient is not nervous/anxious.    PHYSICAL EXAMINATION:  Blood pressure (!) 145/64, pulse 94, temperature 97.6 F (36.4 C), resp. rate 20, height '5\' 2"'  (1.575 m), weight 136 lb 9.6 oz (62 kg), SpO2 100 %.  ECOG PERFORMANCE STATUS: 1 - Symptomatic but completely ambulatory  Physical Exam  Constitutional: Oriented to person, place, and time and well-developed, well-nourished, and in no distress.  HENT:  Head: Normocephalic and atraumatic.  Mouth/Throat: Oropharynx is clear and moist. No oropharyngeal exudate.  Eyes: Conjunctivae are normal. Right eye exhibits no discharge. Left eye exhibits no discharge. No scleral icterus.  Neck: Normal range of motion. Neck supple.  Cardiovascular: Normal rate, regular rhythm, normal heart sounds and intact distal pulses.   Pulmonary/Chest: Effort normal and breath soundsquiet in all lung fields.No respiratory distress. No wheezes. No rales.  Abdominal: Soft. Bowel sounds are normal. Exhibits no distension and no mass. There is no tenderness.  Musculoskeletal: Normal range of motion. Exhibits no edema.  Lymphadenopathy:  No cervical adenopathy.  Neurological: Alert and oriented to person, place, and time. Exhibits normal muscle tone. Gait normal. Coordination normal.  Skin: Skin is warm and dry. No rash noted. Not diaphoretic. No erythema. No pallor.  Psychiatric: Mood, memory and judgment normal.  Vitals reviewed.  LABORATORY DATA: Lab Results  Component Value Date   WBC 11.2 (H) 09/07/2019   HGB 12.0 09/07/2019   HCT 37.2 09/07/2019   MCV 95.6 09/07/2019   PLT 389 09/07/2019      Chemistry      Component Value Date/Time   NA 138 09/07/2019 0915   NA 142 12/24/2016 0816   K 4.3 09/07/2019 0915   K 4.4 12/24/2016 0816   CL 105 09/07/2019 0915   CL 106 06/30/2012 1004   CO2 26 09/07/2019 0915   CO2 28 12/24/2016 0816   BUN 12 09/07/2019 0915   BUN 15.8 12/24/2016 0816   CREATININE 0.76 09/07/2019 0915   CREATININE 1.03 (H) 07/07/2019 0825   CREATININE 1.1 12/24/2016 0816      Component Value Date/Time   CALCIUM 10.5 (H) 09/07/2019 0915   CALCIUM 9.6 12/24/2016 0816   ALKPHOS 77 09/07/2019 0915   ALKPHOS 80 12/24/2016 0816   AST 24 09/07/2019 0915   AST 20 07/07/2019 0825   AST 20 12/24/2016 0816   ALT 9 09/07/2019 0915   ALT 11 07/07/2019 0825   ALT 11 12/24/2016 0816   BILITOT 0.5 09/07/2019 0915   BILITOT 0.4 07/07/2019 0825   BILITOT 0.34 12/24/2016 0816       RADIOGRAPHIC STUDIES:  No results found.   ASSESSMENT/PLAN:  This isa very pleasant 69 year old Caucasian female with metastatic non-small cell lung cancer who was initially diagnosed in October 2007. She is status post current chemoradiation followed by consolidation chemotherapy.  She is currently  undergoing weeklymaintainancechemotherapy with single agent Alimta 500 mg/m. She is status post208cycles.She is tolerating treatment well without any concerning complaintsexcept mild nausea/vomiting.  Labs were reviewed. Recommend that she proceed with cycle #209 today as scheduled.  We will see her back for a follow up visit 3 weeks for evaluation before starting cycle #210  I will arrange for her next restaging CT scan at her next appointment.   Discussed that we can arrange for her to receive her booster vaccine in our clinic. I will send a scheduling message to schedule this during one of her off weeks. She can also get her shingles vaccine through her PCP.   The patient was advised to call immediately if she has any concerning symptoms in the interval. The patient voices understanding of current disease status and treatment options and is in agreement with the current care plan. All questions were answered. The patient knows to  call the clinic with any problems, questions or concerns. We can certainly see the patient much sooner if necessary     No orders of the defined types were placed in this encounter.    Danielle Mcclane L Mahin Guardia, PA-C 09/07/19

## 2019-09-03 NOTE — Assessment & Plan Note (Addendum)
On statin, LDL chol at goal < 70.

## 2019-09-03 NOTE — Assessment & Plan Note (Signed)
Diet controlled.  

## 2019-09-03 NOTE — Progress Notes (Signed)
Chief Complaint  Patient presents with  . Annual Exam    Part 2    History of Present Illness: HPI   The patient presents for annual  review of chronic health problems. He/She also has the following acute concerns today: none  The patient saw a LPN or RN for medicare wellness visit.  Prevention and wellness was reviewed in detail. Note reviewed and important notes copied below.   Hypertension:    BP Readings from Last 3 Encounters:  09/03/19 140/60  08/17/19 (!) 142/58  07/27/19 (!) 147/72  Using medication without problems or lightheadedness:  none Chest pain with exertion:none Edema:yes... use lasix prn. Short of breath:stable Average home BPs: Other issues:  Elevated Cholesterol:  At goal  < 70 * given past CVA)at last check on statin.  Lab Results  Component Value Date   CHOL 122 02/19/2019   HDL 41.00 02/19/2019   LDLCALC 59 02/19/2019   LDLDIRECT 105.5 08/06/2013   TRIG 107.0 02/19/2019   CHOLHDL 3 02/19/2019  Using medications without problems: none Muscle aches: none Diet compliance: good Exercise: minimal given SOB restriction but stays as active as she can. Other complaints:  Diabetes:   Diet controlled at last check.. due for re-eval.  Lab Results  Component Value Date   HGBA1C 5.9 02/19/2019  Using medications without difficulties: Hypoglycemic episodes: Hyperglycemic episodes: Feet problems: no ulcers Blood Sugars averaging: eye exam within last year: 07/01/2019   COPD.. former smoker:  Inadequate control on no inhalers.    This visit occurred during the SARS-CoV-2 public health emergency.  Safety protocols were in place, including screening questions prior to the visit, additional usage of staff PPE, and extensive cleaning of exam room while observing appropriate contact time as indicated for disinfecting solutions.   COVID 19 screen:  No recent travel or known exposure to COVID19 The patient denies respiratory symptoms of COVID 19 at this  time. The importance of social distancing was discussed today.     Review of Systems  Respiratory: Positive for cough and shortness of breath.   Cardiovascular: Positive for leg swelling.      Past Medical History:  Diagnosis Date  . Anemia   . 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 Carcinomas, basal cell 01/22/2019   two places on face  . CVA (cerebral vascular accident) ([place]) 04/2014   pt states had 2 cva's within 2 wks  . DJD (degenerative joint disease), cervical   . Fibromyalgia   . H/O: pneumonia   . History of tobacco abuse quit 10/08   on nicotine patch  . Hypertension   . Hypokalemia   . Lung cancer ([place]) dx'd 09/2005   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 had chemo and radiation for lung ca  . Thrush 12/11/2010    reports that she quit smoking about 13 years ago. She has a 60.00 pack-year smoking history. She has never used smokeless tobacco. She reports that she does not drink alcohol and does not use drugs.   Current Outpatient Medications:  .  amLODipine (NORVASC) 10 MG tablet, TAKE 1 TABLET (10 MG TOTAL) BY MOUTH DAILY., Disp: 90 tablet, Rfl: 3 .  aspirin 325 MG EC tablet, TAKE 1 TABLET BY MOUTH EVERY DAY, Disp: 90 tablet, Rfl: 0 .  atorvastatin (LIPITOR) 40 MG tablet, TAKE 1 TABLET BY MOUTH EVERY DAY AT 6 PM, Disp: 90 tablet, Rfl: 3 .  diphenhydrAMINE (BENADRYL) 25 MG tablet, Take 1 tablet (25 mg total) by mouth as directed.  2 hours before CT scan., Disp: 5 tablet, Rfl: 0 .  FeFum-FePoly-FA-B Cmp-C-Biot (INTEGRA PLUS) CAPS, TAKE 1 CAPSULE BY MOUTH EVERY DAY IN THE MORNING, Disp: 90 capsule, Rfl: 3 .  fluticasone (FLONASE) 50 MCG/ACT nasal spray, Place 1-2 sprays into both nostrils daily as needed., Disp: 16 g, Rfl: 5 .  folic acid (FOLVITE) 1 MG tablet, TAKE 1 TABLET BY MOUTH EVERY DAY, Disp: 90 tablet, Rfl: 1 .  furosemide (LASIX) 20 MG tablet, TAKE 1 TABLET BY MOUTH EVERY DAY AS NEEDED FOR SWELLING, Disp: 20 tablet, Rfl: 1 .   lidocaine-prilocaine (EMLA) cream, Apply to the Port-A-Cath site 30-60-minute before treatment., Disp: 30 g, Rfl: 0 .  lisinopril (ZESTRIL) 20 MG tablet, TAKE 2 TABLETS BY MOUTH EVERY DAY, Disp: 180 tablet, Rfl: 3 .  morphine (MS CONTIN) 60 MG 12 hr tablet, Take 1 tablet (60 mg total) by mouth 2 (two) times daily., Disp: 60 tablet, Rfl: 0 .  morphine (MSIR) 30 MG tablet, Take 1 tablet (30 mg total) by mouth every 4 (four) hours as needed (breakthrough pain)., Disp: 60 tablet, Rfl: 0 .  Olopatadine HCl 0.2 % SOLN, INSTILL 1 DROP INTO AFFECTED EYE(S) EVERY DAY, Disp: 2.5 mL, Rfl: 1 .  predniSONE (DELTASONE) 50 MG tablet, 1 tablet by mouth 13 hours ,7 hours and 1 hour  before CT scan, Disp: 3 tablet, Rfl: 4 .  triamcinolone (KENALOG) 0.025 % ointment, Apply 1 application topically 2 (two) times daily., Disp: 30 g, Rfl: 0   Observations/Objective: Blood pressure 140/60, pulse 94, temperature 98.3 F (36.8 C), temperature source Temporal, height 5' 2.75" (1.594 m), weight 137 lb 4 oz (62.3 kg), SpO2 92 %.  Physical Exam Constitutional:      General: She is not in acute distress.    Appearance: Normal appearance. She is well-developed. She is not ill-appearing or toxic-appearing.  HENT:     Head: Normocephalic.     Right Ear: Hearing, tympanic membrane, ear canal and external ear normal. Tympanic membrane is not erythematous, retracted or bulging.     Left Ear: Hearing, tympanic membrane, ear canal and external ear normal. Tympanic membrane is not erythematous, retracted or bulging.     Nose: No mucosal edema or rhinorrhea.     Right Sinus: No maxillary sinus tenderness or frontal sinus tenderness.     Left Sinus: No maxillary sinus tenderness or frontal sinus tenderness.     Mouth/Throat:     Pharynx: Uvula midline.  Eyes:     General: Lids are normal. Lids are everted, no foreign bodies appreciated.     Conjunctiva/sclera: Conjunctivae normal.     Pupils: Pupils are equal, round, and reactive  to light.  Neck:     Thyroid: No thyroid mass or thyromegaly.     Vascular: No carotid bruit.     Trachea: Trachea normal.  Cardiovascular:     Rate and Rhythm: Normal rate and regular rhythm.     Pulses: Normal pulses.     Heart sounds: Normal heart sounds, S1 normal and S2 normal. No murmur heard.  No friction rub. No gallop.   Pulmonary:     Effort: Pulmonary effort is normal. No tachypnea or respiratory distress.     Breath sounds: Rhonchi present. No decreased breath sounds, wheezing or rales.  Abdominal:     General: Bowel sounds are normal.     Palpations: Abdomen is soft.     Tenderness: There is no abdominal tenderness.  Musculoskeletal:     Cervical  back: Normal range of motion and neck supple.  Skin:    General: Skin is warm and dry.     Findings: No rash.  Neurological:     Mental Status: She is alert.  Psychiatric:        Mood and Affect: Mood is not anxious or depressed.        Speech: Speech normal.        Behavior: Behavior normal. Behavior is cooperative.        Thought Content: Thought content normal.        Judgment: Judgment normal.     Diabetic foot exam: Normal inspection No skin breakdown No calluses  Normal DP pulses Normal sensation to light touch and monofilament Nails normal  Assessment and Plan  The patient's preventative maintenance and recommended screening tests for an annual wellness exam were reviewed in full today. Brought up to date unless services declined.  Counselled on the importance of diet, exercise, and its role in overall health and mortality. The patient's FH and SH was reviewed, including their home life, tobacco status, and drug and alcohol status.   Lung cancer, primary, with metastasis from lung to other site, unspecified laterality ([place])  Stable in active treatment long term. Reviewed last OV note.  Hypertension associated with diabetes ([place]) Well controlled. Continue current medication.   Hyperlipidemia  associated with type 2 diabetes mellitus ([place]) Well controlled. Continue current medication.   Diabetes mellitus with no complication Diet controlled.  Atherosclerosis of aorta On statin, LDL chol at goal < 70.  COPD, moderate ([place])  Not currently on controller.   Will try a trial of Spiriva with albuterol for rescue.     S/P COVID series. Will consider third doe when due. She will proceed with DEXA.  Refuses colon cancer screen and breast cancer screening. Given PNA23.  [name], MD

## 2019-09-03 NOTE — Assessment & Plan Note (Signed)
Stable in active treatment long term. Reviewed last OV note.

## 2019-09-03 NOTE — Patient Instructions (Addendum)
We will try a trial of Spiriva daily for control of COPD.. call if not improving in 2-4 weeks or if cannot afford.  Consider 3rd dose of COVID vaccine  in 12/2019.  Please call the location of your choice from the menu below to schedule your  Bone Density appointment.    [facility]                      Phone:  [id number]                               [place], [place] [address]                                                             Services: Traditional and 3D Mammogram, Bone Density

## 2019-09-06 NOTE — Telephone Encounter (Signed)
Okay to make change. Rx sent in.

## 2019-09-06 NOTE — Telephone Encounter (Signed)
Spiriva is not covered by insurance.  Pharmacy is asking for alternative; Incruse Ellipta or do PA.  Ok to change or move forward with PA?

## 2019-09-07 ENCOUNTER — Inpatient Hospital Stay (HOSPITAL_BASED_OUTPATIENT_CLINIC_OR_DEPARTMENT_OTHER): Payer: Medicare Other | Admitting: Physician Assistant

## 2019-09-07 ENCOUNTER — Inpatient Hospital Stay: Payer: Medicare Other

## 2019-09-07 ENCOUNTER — Other Ambulatory Visit: Payer: Self-pay

## 2019-09-07 VITALS — BP 145/64 | HR 94 | Temp 97.6°F | Resp 20 | Ht 62.0 in | Wt 136.6 lb

## 2019-09-07 DIAGNOSIS — Z95828 Presence of other vascular implants and grafts: Secondary | ICD-10-CM

## 2019-09-07 DIAGNOSIS — C3411 Malignant neoplasm of upper lobe, right bronchus or lung: Secondary | ICD-10-CM

## 2019-09-07 DIAGNOSIS — Z5111 Encounter for antineoplastic chemotherapy: Secondary | ICD-10-CM | POA: Diagnosis not present

## 2019-09-07 DIAGNOSIS — C3490 Malignant neoplasm of unspecified part of unspecified bronchus or lung: Secondary | ICD-10-CM

## 2019-09-07 DIAGNOSIS — D6481 Anemia due to antineoplastic chemotherapy: Secondary | ICD-10-CM | POA: Diagnosis not present

## 2019-09-07 DIAGNOSIS — R0602 Shortness of breath: Secondary | ICD-10-CM | POA: Diagnosis not present

## 2019-09-07 DIAGNOSIS — C7931 Secondary malignant neoplasm of brain: Secondary | ICD-10-CM | POA: Diagnosis not present

## 2019-09-07 DIAGNOSIS — T451X5A Adverse effect of antineoplastic and immunosuppressive drugs, initial encounter: Secondary | ICD-10-CM | POA: Diagnosis not present

## 2019-09-07 LAB — COMPREHENSIVE METABOLIC PANEL
ALT: 9 U/L (ref 0–44)
AST: 24 U/L (ref 15–41)
Albumin: 3.5 g/dL (ref 3.5–5.0)
Alkaline Phosphatase: 77 U/L (ref 38–126)
Anion gap: 7 (ref 5–15)
BUN: 12 mg/dL (ref 8–23)
CO2: 26 mmol/L (ref 22–32)
Calcium: 10.5 mg/dL — ABNORMAL HIGH (ref 8.9–10.3)
Chloride: 105 mmol/L (ref 98–111)
Creatinine, Ser: 0.76 mg/dL (ref 0.44–1.00)
GFR calc Af Amer: >60 mL/min (ref >60)
GFR calc non Af Amer: >60 mL/min (ref >60)
Glucose, Bld: 116 mg/dL — ABNORMAL HIGH (ref 70–99)
Potassium: 4.3 mmol/L (ref 3.5–5.1)
Sodium: 138 mmol/L (ref 135–145)
Total Bilirubin: 0.5 mg/dL (ref 0.3–1.2)
Total Protein: 7.2 g/dL (ref 6.5–8.1)

## 2019-09-07 LAB — CBC WITH DIFFERENTIAL/PLATELET
Abs Immature Granulocytes: 0.04 10*3/uL (ref 0.00–0.07)
Basophils Absolute: 0 10*3/uL (ref 0.0–0.1)
Basophils Relative: 0 %
Eosinophils Absolute: 0 10*3/uL (ref 0.0–0.5)
Eosinophils Relative: 0 %
HCT: 37.2 % (ref 36.0–46.0)
Hemoglobin: 12 g/dL (ref 12.0–15.0)
Immature Granulocytes: 0 %
Lymphocytes Relative: 18 %
Lymphs Abs: 2 10*3/uL (ref 0.7–4.0)
MCH: 30.8 pg (ref 26.0–34.0)
MCHC: 32.3 g/dL (ref 30.0–36.0)
MCV: 95.6 fL (ref 80.0–100.0)
Monocytes Absolute: 1.1 10*3/uL — ABNORMAL HIGH (ref 0.1–1.0)
Monocytes Relative: 10 %
Neutro Abs: 8 10*3/uL — ABNORMAL HIGH (ref 1.7–7.7)
Neutrophils Relative %: 72 %
Platelets: 389 10*3/uL (ref 150–400)
RBC: 3.89 MIL/uL (ref 3.87–5.11)
RDW: 14.2 % (ref 11.5–15.5)
WBC: 11.2 10*3/uL — ABNORMAL HIGH (ref 4.0–10.5)
nRBC: 0 % (ref 0.0–0.2)

## 2019-09-07 MED ORDER — SODIUM CHLORIDE 0.9 % IV SOLN
Freq: Once | INTRAVENOUS | Status: AC
  Filled 2019-09-07: qty 250

## 2019-09-07 MED ORDER — SODIUM CHLORIDE 0.9% FLUSH
10.0000 mL | INTRAVENOUS | Status: DC | PRN
  Administered 2019-09-07: 10 mL via INTRAVENOUS
  Filled 2019-09-07: qty 10

## 2019-09-07 MED ORDER — SODIUM CHLORIDE 0.9% FLUSH
10.0000 mL | INTRAVENOUS | Status: DC | PRN
  Administered 2019-09-07: 10 mL
  Filled 2019-09-07: qty 10

## 2019-09-07 MED ORDER — SODIUM CHLORIDE 0.9 % IV SOLN
10.0000 mg | Freq: Once | INTRAVENOUS | Status: AC
  Administered 2019-09-07: 10 mg via INTRAVENOUS
  Filled 2019-09-07: qty 10

## 2019-09-07 MED ORDER — SODIUM CHLORIDE 0.9 % IV SOLN
500.0000 mg/m2 | Freq: Once | INTRAVENOUS | Status: AC
  Administered 2019-09-07: 800 mg via INTRAVENOUS
  Filled 2019-09-07: qty 20

## 2019-09-07 MED ORDER — HEPARIN SOD (PORK) LOCK FLUSH 100 UNIT/ML IV SOLN
500.0000 [IU] | Freq: Once | INTRAVENOUS | Status: AC | PRN
  Administered 2019-09-07: 500 [IU]
  Filled 2019-09-07: qty 5

## 2019-09-07 MED ORDER — CYANOCOBALAMIN 1000 MCG/ML IJ SOLN: 1000.0000 ug | Freq: Once | INTRAMUSCULAR | Status: DC

## 2019-09-07 MED ORDER — ONDANSETRON HCL 8 MG PO TABS
8.0000 mg | ORAL_TABLET | Freq: Once | ORAL | Status: AC
  Administered 2019-09-07: 8 mg via ORAL

## 2019-09-07 MED ORDER — ONDANSETRON HCL 8 MG PO TABS
ORAL_TABLET | ORAL | Status: AC
  Filled 2019-09-07: qty 1

## 2019-09-07 NOTE — Patient Instructions (Signed)
[facility] Discharge Instructions for Patients Receiving Chemotherapy  Today you received the following chemotherapy agents Alimta   To help prevent nausea and vomiting after your treatment, we encourage you to take your nausea medication as directed.    If you develop nausea and vomiting that is not controlled by your nausea medication, call the clinic.   BELOW ARE SYMPTOMS THAT SHOULD BE REPORTED IMMEDIATELY:  *FEVER GREATER THAN 100.5 F  *CHILLS WITH OR WITHOUT FEVER  NAUSEA AND VOMITING THAT IS NOT CONTROLLED WITH YOUR NAUSEA MEDICATION  *UNUSUAL SHORTNESS OF BREATH  *UNUSUAL BRUISING OR BLEEDING  TENDERNESS IN MOUTH AND THROAT WITH OR WITHOUT PRESENCE OF ULCERS  *URINARY PROBLEMS  *BOWEL PROBLEMS  UNUSUAL RASH Items with * indicate a potential emergency and should be followed up as soon as possible.  Feel free to call the clinic should you have any questions or concerns. The clinic phone number is [phone].  Please show the Danielle [name] at check-in to the Emergency Department and triage nurse.

## 2019-09-09 ENCOUNTER — Other Ambulatory Visit: Payer: Self-pay | Admitting: Family Medicine

## 2019-09-10 ENCOUNTER — Telehealth: Payer: Self-pay

## 2019-09-10 ENCOUNTER — Other Ambulatory Visit: Payer: Self-pay | Admitting: Family Medicine

## 2019-09-10 ENCOUNTER — Other Ambulatory Visit: Payer: Self-pay | Admitting: Internal Medicine

## 2019-09-10 DIAGNOSIS — C7931 Secondary malignant neoplasm of brain: Secondary | ICD-10-CM

## 2019-09-10 DIAGNOSIS — C3411 Malignant neoplasm of upper lobe, right bronchus or lung: Secondary | ICD-10-CM

## 2019-09-10 MED ORDER — ALBUTEROL SULFATE HFA 108 (90 BASE) MCG/ACT IN AERS: 2.0000 | INHALATION_SPRAY | Freq: Four times a day (QID) | RESPIRATORY_TRACT | 2 refills | Status: DC | PRN

## 2019-09-10 NOTE — Telephone Encounter (Signed)
Attempted to call pt but her number was not working and it wouldn't even ring. Tried 3 times.

## 2019-09-10 NOTE — Telephone Encounter (Signed)
Pt left /vm requesting cb when rx for nebulizer is ready for pick up. Pt said was given albuterol neb solution on 09/03/19 but pt needs the nebulizer machine. Last annual on 09/03/19.

## 2019-09-10 NOTE — Telephone Encounter (Signed)
Call patient rx for nebulizer solution was NOT correct. I intended to send in an inhaler.. Start with that.. if issues or nebulizer still requested, let me know.

## 2019-09-14 ENCOUNTER — Other Ambulatory Visit: Payer: Self-pay

## 2019-09-14 ENCOUNTER — Inpatient Hospital Stay: Payer: Medicare Other

## 2019-09-14 DIAGNOSIS — Z23 Encounter for immunization: Secondary | ICD-10-CM | POA: Diagnosis not present

## 2019-09-14 NOTE — Telephone Encounter (Signed)
Left message for Javiana that Dr. Diona Browner ment to send in a Rx for an albuterol inhaler and not nebulizer solutions.  I advised that a Rx for the inhaler has been sent to her pharmacy.  Dr. Diona Browner would like for her to start with the inhaler but if there is any issues to please let us know.

## 2019-09-16 ENCOUNTER — Other Ambulatory Visit: Payer: Self-pay | Admitting: Physician Assistant

## 2019-09-16 ENCOUNTER — Telehealth: Payer: Self-pay | Admitting: Medical Oncology

## 2019-09-16 DIAGNOSIS — C3490 Malignant neoplasm of unspecified part of unspecified bronchus or lung: Secondary | ICD-10-CM

## 2019-09-16 DIAGNOSIS — C3410 Malignant neoplasm of upper lobe, unspecified bronchus or lung: Secondary | ICD-10-CM

## 2019-09-16 MED ORDER — MORPHINE SULFATE ER 60 MG PO TBCR: 60.0000 mg | EXTENDED_RELEASE_TABLET | Freq: Two times a day (BID) | ORAL | 0 refills | Status: DC

## 2019-09-16 NOTE — Telephone Encounter (Signed)
Refill requested for morphine 60 mg

## 2019-09-17 ENCOUNTER — Telehealth: Payer: Self-pay | Admitting: Internal Medicine

## 2019-09-17 NOTE — Telephone Encounter (Signed)
Called patient to confirm upcoming appointments. Cancelled duplicate appointment that was scheduled on 9/22. Patient is aware of all upcoming appointments.

## 2019-09-20 ENCOUNTER — Other Ambulatory Visit: Payer: Self-pay | Admitting: Internal Medicine

## 2019-09-26 NOTE — Progress Notes (Signed)
[facility] OFFICE PROGRESS NOTE  Danielle Sanders, MD [place] [address]  DIAGNOSIS: Metastatic non-small cell lung cancer initially diagnosed as locally advanced stage IIIB with a right Pancoast tumor involving the vertebral body as well as prominent canal invasion with spinal cord compression in October 2007. The patient also has metastatic disease to the brain in April 2008.  PRIOR THERAPY: 1. Status post concurrent chemoradiation with weekly carboplatin and paclitaxel, last dose was given November 18, 2005. 2. 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 3. Status post gamma knife radiotherapy to a solitary brain lesion located in the superior frontal area of the brain at [facility] in April of 2008. 4. Status post palliative radiotherapy to the lateral abdominal wall metastatic lesion under the care of Dr. Lisbeth Renshaw, completed March of 2009. 5. Status post 6 cycles of systemic chemotherapy with carboplatin and Alimta. Last dose was given July 26, 2007 with disease stabilization. 6. Gamma knife stereotactic radiotherapy to 2 brain lesions one involving the right frontal dural based as well as right parietal lesion performed on 05/07/2012 under the care of Dr. Vallarie Mare at [facility].  CURRENT THERAPY: Maintenance systemic chemotherapy with Alimta 500 MG/M2 every 3 weeks, status post 209cycles.  INTERVAL HISTORY: Danielle [name] 69 y.o. female returns to the clinic for a follow up visit.The patient is feeling well today without any concerning complaints.She received her COVID-19 booster in the interval since her last appointment. She is tolerating her treatment with maintenance Alimta well without any adverse side effects except for occasional nausea/vomiting, usually occurring a few time a week.She takes zofran for nausea.She denies any feveror chills. She lost some  weight over the course of the last few treatments which she attributes to her daughter in laws cooking; however, she gained 5 lbs since her last appointment. She states that she started eating what she wants and has been trying to gain weight.She reportedly has baseline night sweats for several years, which are unchanged.She denies any hemoptysis, cough,chest pain. She reports her baseline shortness of breath with exertion.She denies any diarrhea or constipation. She denies any headaches or visual changes.  She is here today for evaluationbeforestarting cycle #210   MEDICAL HISTORY: Past Medical History:  Diagnosis Date  . Anemia   . 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 Carcinomas, basal cell 01/22/2019   two places on face  . CVA (cerebral vascular accident) ([place]) 04/2014   pt states had 2 cva's within 2 wks  . DJD (degenerative joint disease), cervical   . Fibromyalgia   . H/O: pneumonia   . History of tobacco abuse quit 10/08   on nicotine patch  . Hypertension   . Hypokalemia   . Lung cancer ([place]) dx'd 09/2005   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 had chemo and radiation for lung ca  . Thrush 12/11/2010    ALLERGIES:  is allergic to contrast media [iodinated diagnostic agents], iohexol, sulfa drugs cross reactors, and co-trimoxazole injection [sulfamethoxazole-trimethoprim].  MEDICATIONS:  Current Outpatient Medications  Medication Sig Dispense Refill  . albuterol (VENTOLIN HFA) 108 (90 Base) MCG/ACT inhaler Inhale 2 puffs into the lungs every 6 (six) hours as needed for wheezing or shortness of breath. 8 g 2  . amLODipine (NORVASC) 10 MG tablet TAKE 1 TABLET (10 MG TOTAL) BY MOUTH DAILY. 90 tablet 3  . aspirin 325 MG EC tablet TAKE 1 TABLET BY  MOUTH EVERY DAY 90 tablet 0  . atorvastatin (LIPITOR) 40 MG tablet TAKE 1 TABLET BY MOUTH EVERY DAY AT 6 PM 90 tablet 1  . diphenhydrAMINE (BENADRYL) 25 MG tablet Take 1 tablet (25 mg total) by mouth as  directed. 2 hours before CT scan. 5 tablet 0  . FeFum-FePoly-FA-B Cmp-C-Biot (INTEGRA PLUS) CAPS TAKE 1 CAPSULE BY MOUTH EVERY DAY IN THE MORNING 90 capsule 3  . fluticasone (FLONASE) 50 MCG/ACT nasal spray Place 1-2 sprays into both nostrils daily as needed. 16 g 5  . folic acid (FOLVITE) 1 MG tablet TAKE 1 TABLET BY MOUTH EVERY DAY 90 tablet 1  . furosemide (LASIX) 20 MG tablet TAKE 1 TABLET BY MOUTH EVERY DAY AS NEEDED FOR SWELLING 20 tablet 1  . lidocaine-prilocaine (EMLA) cream Apply to the Port-A-Cath site 30-60-minute before treatment. 30 g 0  . lisinopril (ZESTRIL) 20 MG tablet TAKE 2 TABLETS BY MOUTH EVERY DAY 180 tablet 3  . morphine (MS CONTIN) 60 MG 12 hr tablet Take 1 tablet (60 mg total) by mouth 2 (two) times daily. 60 tablet 0  . morphine (MSIR) 30 MG tablet Take 1 tablet (30 mg total) by mouth every 4 (four) hours as needed (breakthrough pain). 60 tablet 0  . Olopatadine HCl 0.2 % SOLN INSTILL 1 DROP INTO AFFECTED EYE(S) EVERY DAY 2.5 mL 1  . predniSONE (DELTASONE) 50 MG tablet 1 tablet by mouth 13 hours ,7 hours and 1 hour  before CT scan 3 tablet 4  . triamcinolone (KENALOG) 0.025 % ointment Apply 1 application topically 2 (two) times daily. 30 g 0  . umeclidinium bromide (INCRUSE ELLIPTA) 62.5 MCG/INH AEPB Inhale 1 puff into the lungs daily. 1 each 11   No current facility-administered medications for this visit.    SURGICAL HISTORY:  Past Surgical History:  Procedure Laterality Date  . CERVICAL LAMINECTOMY  1995  . KNEE SURGERY  1990   Left x 2  . 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 LOOP RECORDER IMPLANT N/A 04/19/2014   Procedure: LOOP RECORDER IMPLANT;  Surgeon: Thompson Grayer, MD;  Location: [facility] CATH LAB;  Service: Cardiovascular;  Laterality: N/A;  . OTHER SURGICAL HISTORY  2008   Gamma knife surgery to remove brain met   . PORTACATH PLACEMENT  -[name]   TIP IN CAVOATRIAL JUNCTION  . TEE WITHOUT CARDIOVERSION N/A 04/19/2014    Procedure: TRANSESOPHAGEAL ECHOCARDIOGRAM (TEE);  Surgeon: Larey Dresser, MD;  Location: [place];  Service: Cardiovascular;  Laterality: N/A;    REVIEW OF SYSTEMS:   Review of Systems  Constitutional: Negative for appetite change, chills, fatigue, fever and unexpected weight change.  HENT:Negative for mouth sores, nosebleeds, sore throat and trouble swallowing.  Eyes: Negative for eye problems and icterus.  Respiratory:Positive for baseline shortness of breath with exertion.Negative for cough, hemoptysis, and wheezing.  Cardiovascular: Positive for bilateral lower extremity swelling. Negative for chest pain. Gastrointestinal:Positive for occasional nausea and vomiting.Negative for abdominal pain, constipation, and diarrhea. Genitourinary: Negative for bladder incontinence, difficulty urinating, dysuria, frequency and hematuria.  Musculoskeletal: Negative for back pain, gait problem, neck pain and neck stiffness.  Skin:Positive for several scars on her skin.Negative for itching and rash.  Neurological: Negative for dizziness, extremity weakness, gait problem, headaches, light-headedness and seizures.  Hematological: Negative for adenopathy. Does not bruise/bleed easily.  Psychiatric/Behavioral: Negative for confusion, depression and sleep disturbance. The patient is not nervous/anxious.    PHYSICAL EXAMINATION:  Blood pressure Marland Kitchen)  160/66, pulse 70, temperature 97.8 F (36.6 C), temperature source Tympanic, resp. rate 19, height '5\' 2"'  (1.575 m), weight 142 lb 6.4 oz (64.6 kg), SpO2 99 %.  ECOG PERFORMANCE STATUS: 1 - Symptomatic but completely ambulatory  Physical Exam  Constitutional: Oriented to person, place, and time and well-developed, well-nourished, and in no distress.  HENT:  Head: Normocephalic and atraumatic.  Mouth/Throat: Oropharynx is clear and moist. No oropharyngeal exudate.  Eyes: Conjunctivae are normal. Right eye exhibits no discharge. Left eye  exhibits no discharge. No scleral icterus.  Neck: Normal range of motion. Neck supple.  Cardiovascular: Normal rate, regular rhythm, normal heart sounds and intact distal pulses.  Pulmonary/Chest: Effort normal and breath soundsquiet in all lung fields.No respiratory distress. No wheezes. No rales.  Abdominal: Soft. Bowel sounds are normal. Exhibits no distension and no mass. There is no tenderness.  Musculoskeletal: Positive for bilateral lower extremity edema. Normal range of motion.  Lymphadenopathy:  No cervical adenopathy.  Neurological: Alert and oriented to person, place, and time. Exhibits normal muscle tone. Gait normal. Coordination normal.  Skin: Skin is warm and dry. No rash noted. Not diaphoretic. No erythema. No pallor.  Psychiatric: Mood, memory and judgment normal.  Vitals reviewed.  LABORATORY DATA: Lab Results  Component Value Date   WBC 11.8 (H) 09/28/2019   HGB 11.4 (L) 09/28/2019   HCT 35.6 (L) 09/28/2019   MCV 96.5 09/28/2019   PLT 387 09/28/2019      Chemistry      Component Value Date/Time   NA 138 09/07/2019 0915   NA 142 12/24/2016 0816   K 4.3 09/07/2019 0915   K 4.4 12/24/2016 0816   CL 105 09/07/2019 0915   CL 106 06/30/2012 1004   CO2 26 09/07/2019 0915   CO2 28 12/24/2016 0816   BUN 12 09/07/2019 0915   BUN 15.8 12/24/2016 0816   CREATININE 0.76 09/07/2019 0915   CREATININE 1.03 (H) 07/07/2019 0825   CREATININE 1.1 12/24/2016 0816      Component Value Date/Time   CALCIUM 10.5 (H) 09/07/2019 0915   CALCIUM 9.6 12/24/2016 0816   ALKPHOS 77 09/07/2019 0915   ALKPHOS 80 12/24/2016 0816   AST 24 09/07/2019 0915   AST 20 07/07/2019 0825   AST 20 12/24/2016 0816   ALT 9 09/07/2019 0915   ALT 11 07/07/2019 0825   ALT 11 12/24/2016 0816   BILITOT 0.5 09/07/2019 0915   BILITOT 0.4 07/07/2019 0825   BILITOT 0.34 12/24/2016 0816       RADIOGRAPHIC STUDIES:  No results found.   ASSESSMENT/PLAN:  This isa very pleasant 69 year old  Caucasian female with metastatic non-small cell lung cancer who was initially diagnosed in October 2007. She is status post current chemoradiation followed by consolidation chemotherapy.  She is currently undergoing weeklymaintainancechemotherapy with single agent Alimta 500 mg/m. She is status post209cycles.She is tolerating treatment well without any concerning complaintsexcept mild nausea/vomiting.  Labs were reviewed. Recommend that she proceed with cycle #210today as scheduled.  We will see her back for a follow up visit 3 weeks for evaluation before starting cycle #211  I will arrange for her next restaging CT scan before her next cycle of treatment. I have sent the prednisone pre-medication to her pharmacy.   The patient was advised to call immediately if she has any concerning symptoms in the interval. The patient voices understanding of current disease status and treatment options and is in agreement with the current care plan. All questions were answered. The  patient knows to call the clinic with any problems, questions or concerns. We can certainly see the patient much sooner if necessary  Orders Placed This Encounter  Procedures  . CT CHEST ABDOMEN PELVIS W CONTRAST    Standing Status:   Future    Standing Expiration Date:   09/27/2020    Order Specific Question:   If indicated for the ordered procedure, I authorize the administration of contrast media per Radiology protocol    Answer:   Yes    Order Specific Question:   Preferred imaging location?    Answer:   [facility]    Order Specific Question:   Is Oral Contrast requested for this exam?    Answer:   Yes, Per Radiology protocol    Order Specific Question:   Radiology Contrast Protocol - do NOT remove file path    Answer:   [email or web address]    Order Specific Question:   Reason for Exam (SYMPTOM  OR DIAGNOSIS REQUIRED)    Answer:   Staging Lung Cancer      Danielle Beahm L Bowdy Bair, PA-C 09/28/19

## 2019-09-28 ENCOUNTER — Other Ambulatory Visit: Payer: Self-pay

## 2019-09-28 ENCOUNTER — Inpatient Hospital Stay: Payer: Medicare Other | Attending: Physician Assistant

## 2019-09-28 ENCOUNTER — Inpatient Hospital Stay: Payer: Medicare Other

## 2019-09-28 ENCOUNTER — Inpatient Hospital Stay (HOSPITAL_BASED_OUTPATIENT_CLINIC_OR_DEPARTMENT_OTHER): Payer: Medicare Other | Admitting: Physician Assistant

## 2019-09-28 VITALS — BP 160/66 | HR 70 | Temp 97.8°F | Resp 19 | Ht 62.0 in | Wt 142.4 lb

## 2019-09-28 DIAGNOSIS — M797 Fibromyalgia: Secondary | ICD-10-CM | POA: Diagnosis not present

## 2019-09-28 DIAGNOSIS — Z95828 Presence of other vascular implants and grafts: Secondary | ICD-10-CM

## 2019-09-28 DIAGNOSIS — Z91041 Radiographic dye allergy status: Secondary | ICD-10-CM | POA: Diagnosis not present

## 2019-09-28 DIAGNOSIS — C3411 Malignant neoplasm of upper lobe, right bronchus or lung: Secondary | ICD-10-CM

## 2019-09-28 DIAGNOSIS — C3490 Malignant neoplasm of unspecified part of unspecified bronchus or lung: Secondary | ICD-10-CM

## 2019-09-28 DIAGNOSIS — Z8673 Personal history of transient ischemic attack (TIA), and cerebral infarction without residual deficits: Secondary | ICD-10-CM | POA: Insufficient documentation

## 2019-09-28 DIAGNOSIS — M47812 Spondylosis without myelopathy or radiculopathy, cervical region: Secondary | ICD-10-CM | POA: Insufficient documentation

## 2019-09-28 DIAGNOSIS — I10 Essential (primary) hypertension: Secondary | ICD-10-CM | POA: Insufficient documentation

## 2019-09-28 DIAGNOSIS — Z5111 Encounter for antineoplastic chemotherapy: Secondary | ICD-10-CM | POA: Diagnosis not present

## 2019-09-28 DIAGNOSIS — Z85118 Personal history of other malignant neoplasm of bronchus and lung: Secondary | ICD-10-CM | POA: Diagnosis not present

## 2019-09-28 DIAGNOSIS — R112 Nausea with vomiting, unspecified: Secondary | ICD-10-CM | POA: Insufficient documentation

## 2019-09-28 DIAGNOSIS — C7931 Secondary malignant neoplasm of brain: Secondary | ICD-10-CM | POA: Diagnosis not present

## 2019-09-28 DIAGNOSIS — Z7982 Long term (current) use of aspirin: Secondary | ICD-10-CM | POA: Diagnosis not present

## 2019-09-28 DIAGNOSIS — Z79899 Other long term (current) drug therapy: Secondary | ICD-10-CM | POA: Diagnosis not present

## 2019-09-28 LAB — CBC WITH DIFFERENTIAL/PLATELET
Abs Immature Granulocytes: 0.09 10*3/uL — ABNORMAL HIGH (ref 0.00–0.07)
Basophils Absolute: 0.1 10*3/uL (ref 0.0–0.1)
Basophils Relative: 0 %
Eosinophils Absolute: 0.1 10*3/uL (ref 0.0–0.5)
Eosinophils Relative: 1 %
HCT: 35.6 % — ABNORMAL LOW (ref 36.0–46.0)
Hemoglobin: 11.4 g/dL — ABNORMAL LOW (ref 12.0–15.0)
Immature Granulocytes: 1 %
Lymphocytes Relative: 19 %
Lymphs Abs: 2.3 10*3/uL (ref 0.7–4.0)
MCH: 30.9 pg (ref 26.0–34.0)
MCHC: 32 g/dL (ref 30.0–36.0)
MCV: 96.5 fL (ref 80.0–100.0)
Monocytes Absolute: 1.8 10*3/uL — ABNORMAL HIGH (ref 0.1–1.0)
Monocytes Relative: 15 %
Neutro Abs: 7.6 10*3/uL (ref 1.7–7.7)
Neutrophils Relative %: 64 %
Platelets: 387 10*3/uL (ref 150–400)
RBC: 3.69 MIL/uL — ABNORMAL LOW (ref 3.87–5.11)
RDW: 14.7 % (ref 11.5–15.5)
WBC: 11.8 10*3/uL — ABNORMAL HIGH (ref 4.0–10.5)
nRBC: 0 % (ref 0.0–0.2)

## 2019-09-28 LAB — COMPREHENSIVE METABOLIC PANEL
ALT: 16 U/L (ref 0–44)
AST: 22 U/L (ref 15–41)
Albumin: 3.1 g/dL — ABNORMAL LOW (ref 3.5–5.0)
Alkaline Phosphatase: 75 U/L (ref 38–126)
Anion gap: 8 (ref 5–15)
BUN: 11 mg/dL (ref 8–23)
CO2: 25 mmol/L (ref 22–32)
Calcium: 9.4 mg/dL (ref 8.9–10.3)
Chloride: 105 mmol/L (ref 98–111)
Creatinine, Ser: 0.73 mg/dL (ref 0.44–1.00)
GFR calc Af Amer: >60 mL/min (ref >60)
GFR calc non Af Amer: >60 mL/min (ref >60)
Glucose, Bld: 123 mg/dL — ABNORMAL HIGH (ref 70–99)
Potassium: 4.4 mmol/L (ref 3.5–5.1)
Sodium: 138 mmol/L (ref 135–145)
Total Bilirubin: 0.3 mg/dL (ref 0.3–1.2)
Total Protein: 6.8 g/dL (ref 6.5–8.1)

## 2019-09-28 MED ORDER — SODIUM CHLORIDE 0.9% FLUSH
10.0000 mL | INTRAVENOUS | Status: DC | PRN
  Administered 2019-09-28: 10 mL via INTRAVENOUS
  Filled 2019-09-28: qty 10

## 2019-09-28 MED ORDER — PREDNISONE 50 MG PO TABS: ORAL_TABLET | ORAL | 4 refills | Status: DC

## 2019-09-28 MED ORDER — HEPARIN SOD (PORK) LOCK FLUSH 100 UNIT/ML IV SOLN
500.0000 [IU] | Freq: Once | INTRAVENOUS | Status: AC | PRN
  Administered 2019-09-28: 500 [IU]
  Filled 2019-09-28: qty 5

## 2019-09-28 MED ORDER — SODIUM CHLORIDE 0.9 % IV SOLN
500.0000 mg/m2 | Freq: Once | INTRAVENOUS | Status: AC
  Administered 2019-09-28: 800 mg via INTRAVENOUS
  Filled 2019-09-28: qty 20

## 2019-09-28 MED ORDER — SODIUM CHLORIDE 0.9% FLUSH
10.0000 mL | INTRAVENOUS | Status: DC | PRN
  Administered 2019-09-28: 10 mL
  Filled 2019-09-28: qty 10

## 2019-09-28 MED ORDER — SODIUM CHLORIDE 0.9 % IV SOLN
Freq: Once | INTRAVENOUS | Status: AC
  Filled 2019-09-28: qty 250

## 2019-09-28 MED ORDER — ONDANSETRON HCL 8 MG PO TABS
ORAL_TABLET | ORAL | Status: AC
  Filled 2019-09-28: qty 1

## 2019-09-28 MED ORDER — ONDANSETRON HCL 8 MG PO TABS
8.0000 mg | ORAL_TABLET | Freq: Once | ORAL | Status: AC
  Administered 2019-09-28: 8 mg via ORAL

## 2019-09-28 MED ORDER — SODIUM CHLORIDE 0.9 % IV SOLN
10.0000 mg | Freq: Once | INTRAVENOUS | Status: AC
  Administered 2019-09-28: 10 mg via INTRAVENOUS
  Filled 2019-09-28: qty 10

## 2019-09-28 NOTE — Patient Instructions (Signed)
[facility] Discharge Instructions for Patients Receiving Chemotherapy  Today you received the following chemotherapy agents Pemetrexed (ALIMTA).  To help prevent nausea and vomiting after your treatment, we encourage you to take your nausea medication as prescribed.   If you develop nausea and vomiting that is not controlled by your nausea medication, call the clinic.   BELOW ARE SYMPTOMS THAT SHOULD BE REPORTED IMMEDIATELY:  *FEVER GREATER THAN 100.5 F  *CHILLS WITH OR WITHOUT FEVER  NAUSEA AND VOMITING THAT IS NOT CONTROLLED WITH YOUR NAUSEA MEDICATION  *UNUSUAL SHORTNESS OF BREATH  *UNUSUAL BRUISING OR BLEEDING  TENDERNESS IN MOUTH AND THROAT WITH OR WITHOUT PRESENCE OF ULCERS  *URINARY PROBLEMS  *BOWEL PROBLEMS  UNUSUAL RASH Items with * indicate a potential emergency and should be followed up as soon as possible.  Feel free to call the clinic should you have any questions or concerns. The clinic phone number is [phone].  Please show the [facility] at check-in to the Emergency Department and triage nurse.

## 2019-09-28 NOTE — Patient Instructions (Signed)

## 2019-09-29 ENCOUNTER — Ambulatory Visit: Payer: Medicare Other

## 2019-09-29 ENCOUNTER — Other Ambulatory Visit: Payer: Medicare Other

## 2019-09-29 ENCOUNTER — Ambulatory Visit: Payer: Medicare Other | Admitting: Physician Assistant

## 2019-09-29 ENCOUNTER — Telehealth: Payer: Self-pay | Admitting: Physician Assistant

## 2019-09-29 NOTE — Telephone Encounter (Signed)
Scheduled per los. Called and left msg. Mailed printout  °

## 2019-10-05 ENCOUNTER — Telehealth: Payer: Self-pay | Admitting: Internal Medicine

## 2019-10-05 NOTE — Telephone Encounter (Signed)
R/s 10/12 appt- per provider PAL. Called and spoke with patient. Confirmed appt

## 2019-10-06 ENCOUNTER — Telehealth: Payer: Self-pay | Admitting: Medical Oncology

## 2019-10-06 NOTE — Telephone Encounter (Signed)
prednisone refill requested. I called it in to pharmacy and asked them to return my call to see if they received Danielle [name]'s phone message for the same with 4 refills on 9/21

## 2019-10-15 ENCOUNTER — Encounter (HOSPITAL_COMMUNITY): Payer: Self-pay

## 2019-10-15 ENCOUNTER — Ambulatory Visit (HOSPITAL_COMMUNITY)
Admission: RE | Admit: 2019-10-15 | Discharge: 2019-10-15 | Disposition: A | Discharge status: Home / Self Care | Payer: Medicare Other | Source: Ambulatory Visit | Attending: Physician Assistant | Admitting: Physician Assistant

## 2019-10-15 ENCOUNTER — Other Ambulatory Visit: Payer: Self-pay

## 2019-10-15 DIAGNOSIS — I700 Atherosclerosis of aorta: Secondary | ICD-10-CM | POA: Diagnosis not present

## 2019-10-15 DIAGNOSIS — J90 Pleural effusion, not elsewhere classified: Secondary | ICD-10-CM | POA: Diagnosis not present

## 2019-10-15 DIAGNOSIS — C3490 Malignant neoplasm of unspecified part of unspecified bronchus or lung: Secondary | ICD-10-CM

## 2019-10-15 DIAGNOSIS — J8410 Pulmonary fibrosis, unspecified: Secondary | ICD-10-CM | POA: Diagnosis not present

## 2019-10-15 DIAGNOSIS — C3491 Malignant neoplasm of unspecified part of right bronchus or lung: Secondary | ICD-10-CM | POA: Diagnosis not present

## 2019-10-15 DIAGNOSIS — I2510 Atherosclerotic heart disease of native coronary artery without angina pectoris: Secondary | ICD-10-CM | POA: Diagnosis not present

## 2019-10-15 MED ORDER — HEPARIN SOD (PORK) LOCK FLUSH 100 UNIT/ML IV SOLN
500.0000 [IU] | Freq: Once | INTRAVENOUS | Status: AC
  Administered 2019-10-15: 500 [IU] via INTRAVENOUS

## 2019-10-15 MED ORDER — HEPARIN SOD (PORK) LOCK FLUSH 100 UNIT/ML IV SOLN
INTRAVENOUS | Status: AC
  Filled 2019-10-15: qty 5

## 2019-10-15 MED ORDER — IOHEXOL 300 MG/ML  SOLN
100.0000 mL | Freq: Once | INTRAMUSCULAR | Status: AC | PRN
  Administered 2019-10-15: 100 mL via INTRAVENOUS

## 2019-10-18 ENCOUNTER — Inpatient Hospital Stay: Payer: Medicare Other

## 2019-10-18 ENCOUNTER — Other Ambulatory Visit: Payer: Self-pay

## 2019-10-18 ENCOUNTER — Other Ambulatory Visit: Payer: Self-pay | Admitting: Internal Medicine

## 2019-10-18 ENCOUNTER — Encounter: Payer: Self-pay | Admitting: Internal Medicine

## 2019-10-18 ENCOUNTER — Inpatient Hospital Stay: Payer: Medicare Other | Attending: Physician Assistant

## 2019-10-18 ENCOUNTER — Inpatient Hospital Stay (HOSPITAL_BASED_OUTPATIENT_CLINIC_OR_DEPARTMENT_OTHER): Payer: Medicare Other | Admitting: Internal Medicine

## 2019-10-18 VITALS — BP 145/60 | HR 90 | Temp 97.6°F | Resp 20 | Ht 62.0 in | Wt 139.2 lb

## 2019-10-18 DIAGNOSIS — C3490 Malignant neoplasm of unspecified part of unspecified bronchus or lung: Secondary | ICD-10-CM

## 2019-10-18 DIAGNOSIS — C7951 Secondary malignant neoplasm of bone: Secondary | ICD-10-CM | POA: Insufficient documentation

## 2019-10-18 DIAGNOSIS — Z5111 Encounter for antineoplastic chemotherapy: Secondary | ICD-10-CM | POA: Insufficient documentation

## 2019-10-18 DIAGNOSIS — Z8673 Personal history of transient ischemic attack (TIA), and cerebral infarction without residual deficits: Secondary | ICD-10-CM | POA: Diagnosis not present

## 2019-10-18 DIAGNOSIS — C3410 Malignant neoplasm of upper lobe, unspecified bronchus or lung: Secondary | ICD-10-CM

## 2019-10-18 DIAGNOSIS — I10 Essential (primary) hypertension: Secondary | ICD-10-CM | POA: Insufficient documentation

## 2019-10-18 DIAGNOSIS — M47812 Spondylosis without myelopathy or radiculopathy, cervical region: Secondary | ICD-10-CM | POA: Insufficient documentation

## 2019-10-18 DIAGNOSIS — C3411 Malignant neoplasm of upper lobe, right bronchus or lung: Secondary | ICD-10-CM

## 2019-10-18 DIAGNOSIS — C7931 Secondary malignant neoplasm of brain: Secondary | ICD-10-CM | POA: Insufficient documentation

## 2019-10-18 DIAGNOSIS — Z7982 Long term (current) use of aspirin: Secondary | ICD-10-CM | POA: Insufficient documentation

## 2019-10-18 DIAGNOSIS — M797 Fibromyalgia: Secondary | ICD-10-CM | POA: Insufficient documentation

## 2019-10-18 DIAGNOSIS — Z95828 Presence of other vascular implants and grafts: Secondary | ICD-10-CM

## 2019-10-18 DIAGNOSIS — Z79899 Other long term (current) drug therapy: Secondary | ICD-10-CM | POA: Insufficient documentation

## 2019-10-18 DIAGNOSIS — G893 Neoplasm related pain (acute) (chronic): Secondary | ICD-10-CM | POA: Insufficient documentation

## 2019-10-18 DIAGNOSIS — Z87891 Personal history of nicotine dependence: Secondary | ICD-10-CM | POA: Diagnosis not present

## 2019-10-18 LAB — CBC WITH DIFFERENTIAL/PLATELET
Abs Immature Granulocytes: 0.03 10*3/uL (ref 0.00–0.07)
Basophils Absolute: 0 10*3/uL (ref 0.0–0.1)
Basophils Relative: 1 %
Eosinophils Absolute: 0.1 10*3/uL (ref 0.0–0.5)
Eosinophils Relative: 1 %
HCT: 35.8 % — ABNORMAL LOW (ref 36.0–46.0)
Hemoglobin: 11.2 g/dL — ABNORMAL LOW (ref 12.0–15.0)
Immature Granulocytes: 0 %
Lymphocytes Relative: 22 %
Lymphs Abs: 1.9 10*3/uL (ref 0.7–4.0)
MCH: 30.7 pg (ref 26.0–34.0)
MCHC: 31.3 g/dL (ref 30.0–36.0)
MCV: 98.1 fL (ref 80.0–100.0)
Monocytes Absolute: 1.1 10*3/uL — ABNORMAL HIGH (ref 0.1–1.0)
Monocytes Relative: 13 %
Neutro Abs: 5.4 10*3/uL (ref 1.7–7.7)
Neutrophils Relative %: 63 %
Platelets: 335 10*3/uL (ref 150–400)
RBC: 3.65 MIL/uL — ABNORMAL LOW (ref 3.87–5.11)
RDW: 14.7 % (ref 11.5–15.5)
WBC: 8.5 10*3/uL (ref 4.0–10.5)
nRBC: 0 % (ref 0.0–0.2)

## 2019-10-18 LAB — COMPREHENSIVE METABOLIC PANEL
ALT: 14 U/L (ref 0–44)
AST: 22 U/L (ref 15–41)
Albumin: 3.2 g/dL — ABNORMAL LOW (ref 3.5–5.0)
Alkaline Phosphatase: 69 U/L (ref 38–126)
Anion gap: 4 — ABNORMAL LOW (ref 5–15)
BUN: 11 mg/dL (ref 8–23)
CO2: 27 mmol/L (ref 22–32)
Calcium: 9.5 mg/dL (ref 8.9–10.3)
Chloride: 105 mmol/L (ref 98–111)
Creatinine, Ser: 0.79 mg/dL (ref 0.44–1.00)
GFR, Estimated: >60 mL/min (ref >60)
Glucose, Bld: 114 mg/dL — ABNORMAL HIGH (ref 70–99)
Potassium: 4.6 mmol/L (ref 3.5–5.1)
Sodium: 136 mmol/L (ref 135–145)
Total Bilirubin: 0.3 mg/dL (ref 0.3–1.2)
Total Protein: 6.9 g/dL (ref 6.5–8.1)

## 2019-10-18 MED ORDER — SODIUM CHLORIDE 0.9 % IV SOLN
10.0000 mg | Freq: Once | INTRAVENOUS | Status: AC
  Administered 2019-10-18: 10 mg via INTRAVENOUS
  Filled 2019-10-18: qty 10

## 2019-10-18 MED ORDER — SODIUM CHLORIDE 0.9 % IV SOLN
500.0000 mg/m2 | Freq: Once | INTRAVENOUS | Status: AC
  Administered 2019-10-18: 800 mg via INTRAVENOUS
  Filled 2019-10-18: qty 12

## 2019-10-18 MED ORDER — ONDANSETRON HCL 8 MG PO TABS
ORAL_TABLET | ORAL | Status: AC
  Filled 2019-10-18: qty 1

## 2019-10-18 MED ORDER — CYANOCOBALAMIN 1000 MCG/ML IJ SOLN
INTRAMUSCULAR | Status: AC
  Filled 2019-10-18: qty 1

## 2019-10-18 MED ORDER — ONDANSETRON HCL 8 MG PO TABS
8.0000 mg | ORAL_TABLET | Freq: Once | ORAL | Status: AC
  Administered 2019-10-18: 8 mg via ORAL

## 2019-10-18 MED ORDER — SODIUM CHLORIDE 0.9% FLUSH
10.0000 mL | INTRAVENOUS | Status: DC | PRN
  Administered 2019-10-18: 10 mL
  Filled 2019-10-18: qty 10

## 2019-10-18 MED ORDER — SODIUM CHLORIDE 0.9% FLUSH
10.0000 mL | INTRAVENOUS | Status: DC | PRN
  Administered 2019-10-18: 10 mL via INTRAVENOUS
  Filled 2019-10-18: qty 10

## 2019-10-18 MED ORDER — CYANOCOBALAMIN 1000 MCG/ML IJ SOLN
1000.0000 ug | Freq: Once | INTRAMUSCULAR | Status: AC
  Administered 2019-10-18: 1000 ug via INTRAMUSCULAR

## 2019-10-18 MED ORDER — SODIUM CHLORIDE 0.9 % IV SOLN
Freq: Once | INTRAVENOUS | Status: AC
  Filled 2019-10-18: qty 250

## 2019-10-18 MED ORDER — MORPHINE SULFATE ER 60 MG PO TBCR: 60.0000 mg | EXTENDED_RELEASE_TABLET | Freq: Two times a day (BID) | ORAL | 0 refills | Status: DC

## 2019-10-18 MED ORDER — HEPARIN SOD (PORK) LOCK FLUSH 100 UNIT/ML IV SOLN
500.0000 [IU] | Freq: Once | INTRAVENOUS | Status: AC | PRN
  Administered 2019-10-18: 500 [IU]
  Filled 2019-10-18: qty 5

## 2019-10-18 NOTE — Progress Notes (Signed)
[place] [phone]   [phone]  OFFICE PROGRESS NOTE  [name], MD [place] [address]  DIAGNOSIS: Metastatic non-small cell lung cancer initially diagnosed as locally advanced stage IIIB with a right Pancoast tumor involving the vertebral body as well as prominent canal invasion with spinal cord compression in October 2007. The patient also has metastatic disease to the brain in April 2008.  PRIOR THERAPY: 1. Status post concurrent chemoradiation with weekly carboplatin and paclitaxel, last dose was given November 18, 2005. 2. 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 3. Status post gamma knife radiotherapy to a solitary brain lesion located in the superior frontal area of the brain at [facility] in April of 2008. 4. Status post palliative radiotherapy to the lateral abdominal wall metastatic lesion under the care of Dr. Lisbeth Renshaw, completed March of 2009. 5. Status post 6 cycles of systemic chemotherapy with carboplatin and Alimta. Last dose was given July 26, 2007 with disease stabilization. 6. Gamma knife stereotactic radiotherapy to 2 brain lesions one involving the right frontal dural based as well as right parietal lesion performed on 05/07/2012 under the care of Dr. Vallarie Mare at [facility].  CURRENT THERAPY: Maintenance systemic chemotherapy with Alimta 500 MG/M2 every 3 weeks, status post 210 cycles.  INTERVAL HISTORY: Danielle [name] 69 y.o. female returns to the clinic today for follow-up visit.  The patient has no significant complaints today except for the intermittent low back pain.  She is requesting refill of MS Contin.  She denied having any chest pain, shortness of breath, cough or hemoptysis.  She denied having any fever or chills.  She has no nausea, vomiting, diarrhea or constipation.  She has no headache or  visual changes.  She denied having any recent weight loss or night sweats.  She had repeat CT scan of the chest, abdomen pelvis performed recently and she is here for evaluation and discussion of her scan results.  MEDICAL HISTORY: Past Medical History:  Diagnosis Date  . Anemia   . 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 Carcinomas, basal cell 01/22/2019   two places on face  . CVA (cerebral vascular accident) ([place]) 04/2014   pt states had 2 cva's within 2 wks  . DJD (degenerative joint disease), cervical   . Fibromyalgia   . H/O: pneumonia   . History of tobacco abuse quit 10/08   on nicotine patch  . Hypertension   . Hypokalemia   . Lung cancer ([place]) dx'd 09/2005   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 had chemo and radiation for lung ca  . Thrush 12/11/2010    ALLERGIES:  is allergic to contrast media [iodinated diagnostic agents], iohexol, sulfa drugs cross reactors, and co-trimoxazole injection [sulfamethoxazole-trimethoprim].  MEDICATIONS:  Current Outpatient Medications  Medication Sig Dispense Refill  . albuterol (VENTOLIN HFA) 108 (90 Base) MCG/ACT inhaler Inhale 2 puffs into the lungs every 6 (six) hours as needed for wheezing or shortness of breath. 8 g 2  . amLODipine (NORVASC) 10 MG tablet TAKE 1 TABLET (10 MG TOTAL) BY MOUTH DAILY. 90 tablet 3  . aspirin 325 MG EC tablet TAKE 1 TABLET BY MOUTH EVERY DAY 90 tablet 0  . atorvastatin (LIPITOR) 40 MG tablet TAKE 1 TABLET BY MOUTH EVERY DAY AT 6 PM 90 tablet 1  . diphenhydrAMINE (BENADRYL) 25 MG tablet Take 1 tablet (25 mg  total) by mouth as directed. 2 hours before CT scan. 5 tablet 0  . FeFum-FePoly-FA-B Cmp-C-Biot (INTEGRA PLUS) CAPS TAKE 1 CAPSULE BY MOUTH EVERY DAY IN THE MORNING 90 capsule 3  . fluticasone (FLONASE) 50 MCG/ACT nasal spray Place 1-2 sprays into both nostrils daily as needed. 16 g 5  . folic acid (FOLVITE) 1 MG tablet TAKE 1 TABLET BY MOUTH EVERY DAY 90 tablet 1  . furosemide  (LASIX) 20 MG tablet TAKE 1 TABLET BY MOUTH EVERY DAY AS NEEDED FOR SWELLING 20 tablet 1  . lidocaine-prilocaine (EMLA) cream Apply to the Port-A-Cath site 30-60-minute before treatment. 30 g 0  . lisinopril (ZESTRIL) 20 MG tablet TAKE 2 TABLETS BY MOUTH EVERY DAY 180 tablet 3  . morphine (MS CONTIN) 60 MG 12 hr tablet Take 1 tablet (60 mg total) by mouth 2 (two) times daily. 60 tablet 0  . morphine (MSIR) 30 MG tablet Take 1 tablet (30 mg total) by mouth every 4 (four) hours as needed (breakthrough pain). 60 tablet 0  . Olopatadine HCl 0.2 % SOLN INSTILL 1 DROP INTO AFFECTED EYE(S) EVERY DAY 2.5 mL 1  . predniSONE (DELTASONE) 50 MG tablet 1 tablet by mouth 13 hours ,7 hours and 1 hour  before CT scan 3 tablet 4  . triamcinolone (KENALOG) 0.025 % ointment Apply 1 application topically 2 (two) times daily. 30 g 0  . umeclidinium bromide (INCRUSE ELLIPTA) 62.5 MCG/INH AEPB Inhale 1 puff into the lungs daily. 1 each 11   No current facility-administered medications for this visit.   Facility-Administered Medications Ordered in Other Visits  Medication Dose Route Frequency Provider Last Rate Last Admin  . sodium chloride flush (NS) 0.9 % injection 10 mL  10 mL Intravenous PRN Curt Bears, MD   10 mL at 10/18/19 1036    SURGICAL HISTORY:  Past Surgical History:  Procedure Laterality Date  . CERVICAL LAMINECTOMY  1995  . KNEE SURGERY  1990   Left x 2  . 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 LOOP RECORDER IMPLANT N/A 04/19/2014   Procedure: LOOP RECORDER IMPLANT;  Surgeon: Thompson Grayer, MD;  Location: [facility] CATH LAB;  Service: Cardiovascular;  Laterality: N/A;  . OTHER SURGICAL HISTORY  2008   Gamma knife surgery to remove brain met   . PORTACATH PLACEMENT  -[name]   TIP IN CAVOATRIAL JUNCTION  . TEE WITHOUT CARDIOVERSION N/A 04/19/2014   Procedure: TRANSESOPHAGEAL ECHOCARDIOGRAM (TEE);  Surgeon: Larey Dresser, MD;  Location: [place];  Service:  Cardiovascular;  Laterality: N/A;    REVIEW OF SYSTEMS:  Constitutional: negative Eyes: negative Ears, nose, mouth, throat, and face: negative Respiratory: negative Cardiovascular: negative Gastrointestinal: negative Genitourinary:negative Integument/breast: negative Hematologic/lymphatic: negative Musculoskeletal:positive for back pain Neurological: negative Behavioral/Psych: negative Endocrine: negative Allergic/Immunologic: negative   PHYSICAL EXAMINATION: General appearance: alert, cooperative, fatigued and no distress Head: Normocephalic, without obvious abnormality, atraumatic Neck: no adenopathy, no JVD, supple, symmetrical, trachea midline and thyroid not enlarged, symmetric, no tenderness/mass/nodules Lymph nodes: Cervical, supraclavicular, and axillary nodes normal. Resp: clear to auscultation bilaterally Back: symmetric, no curvature. ROM normal. No CVA tenderness. Cardio: regular rate and rhythm, S1, S2 normal, no murmur, click, rub or gallop GI: soft, non-tender; bowel sounds normal; no masses,  no organomegaly Extremities: extremities normal, atraumatic, no cyanosis or edema Neurologic: Alert and oriented X 3, normal strength and tone. Normal symmetric reflexes. Normal coordination and gait  ECOG PERFORMANCE STATUS: 1 - Symptomatic but completely  ambulatory  Blood pressure (!) 145/60, pulse 90, temperature 97.6 F (36.4 C), temperature source Tympanic, resp. rate 20, height '5\' 2"'  (1.575 m), weight 139 lb 3.2 oz (63.1 kg), SpO2 97 %.  LABORATORY DATA: Lab Results  Component Value Date   WBC 11.8 (H) 09/28/2019   HGB 11.4 (L) 09/28/2019   HCT 35.6 (L) 09/28/2019   MCV 96.5 09/28/2019   PLT 387 09/28/2019      Chemistry      Component Value Date/Time   NA 138 09/28/2019 1032   NA 142 12/24/2016 0816   K 4.4 09/28/2019 1032   K 4.4 12/24/2016 0816   CL 105 09/28/2019 1032   CL 106 06/30/2012 1004   CO2 25 09/28/2019 1032   CO2 28 12/24/2016 0816   BUN  11 09/28/2019 1032   BUN 15.8 12/24/2016 0816   CREATININE 0.73 09/28/2019 1032   CREATININE 1.03 (H) 07/07/2019 0825   CREATININE 1.1 12/24/2016 0816      Component Value Date/Time   CALCIUM 9.4 09/28/2019 1032   CALCIUM 9.6 12/24/2016 0816   ALKPHOS 75 09/28/2019 1032   ALKPHOS 80 12/24/2016 0816   AST 22 09/28/2019 1032   AST 20 07/07/2019 0825   AST 20 12/24/2016 0816   ALT 16 09/28/2019 1032   ALT 11 07/07/2019 0825   ALT 11 12/24/2016 0816   BILITOT 0.3 09/28/2019 1032   BILITOT 0.4 07/07/2019 0825   BILITOT 0.34 12/24/2016 0816       RADIOGRAPHIC STUDIES: CT CHEST ABDOMEN PELVIS W CONTRAST  Result Date: 10/15/2019 CLINICAL DATA:  Metastatic right lung cancer restaging EXAM: CT CHEST, ABDOMEN, AND PELVIS WITH CONTRAST TECHNIQUE: Multidetector CT imaging of the chest, abdomen and pelvis was performed following the standard protocol during bolus administration of intravenous contrast. CONTRAST:  162m OMNIPAQUE IOHEXOL 300 MG/ML SOLN, additional oral enteric contrast COMPARISON:  04/09/2019 FINDINGS: CT CHEST FINDINGS Cardiovascular: Right chest port catheter. Aortic atherosclerosis. Normal heart size. Three-vessel coronary artery calcifications. No pericardial effusion. Mediastinum/Nodes: No enlarged mediastinal, hilar, or axillary lymph nodes. Thyroid gland, trachea, and esophagus demonstrate no significant findings. Lungs/Pleura: Moderate centrilobular emphysema. Unchanged post treatment fibrotic appearance of the perihilar and suprahilar right upper lobe. Stable, benign calcified nodule of the posterior right upper lobe measuring 3 mm (series 6, image 46). Stable irregular nodule of the superior segment left lower lobe measuring 3 mm (series 6, image 51). Unchanged trace right pleural effusion and associated atelectasis. Musculoskeletal: No chest wall mass. Implantable loop recorder. Unchanged high-grade wedge deformity of the T3 vertebral body. Redemonstrated vertebroplasty of T4  and T7 without significant height loss deformity. CT ABDOMEN PELVIS FINDINGS Hepatobiliary: No solid liver abnormality is seen. No gallstones, gallbladder wall thickening, or biliary dilatation. Pancreas: Unremarkable. No pancreatic ductal dilatation or surrounding inflammatory changes. Spleen: Normal in size without significant abnormality. Adrenals/Urinary Tract: Adrenal glands are unremarkable. Kidneys are normal, without renal calculi, solid lesion, or hydronephrosis. Bladder is unremarkable. Stomach/Bowel: Stomach is within normal limits. Appendix appears normal. No evidence of bowel wall thickening, distention, or inflammatory changes. Vascular/Lymphatic: No significant vascular findings are present. No enlarged abdominal or pelvic lymph nodes. Reproductive: No mass or other abnormality. Other: No abdominal wall hernia or abnormality. No abdominopelvic ascites. Musculoskeletal: No acute or significant osseous findings. Previously described sclerosis of the L1 vertebral body is not appreciated on this examination. IMPRESSION: 1. Unchanged post treatment fibrotic appearance of the perihilar and suprahilar right upper lobe. 2. Stable trace right pleural effusion and associated atelectasis. 3. Stable  small pulmonary nodules, very likely benign and incidental. Attention on follow-up. 4. Redemonstrated high-grade wedge deformity of T3 with vertebroplasty of T4 and T7. 5. No evidence of recurrent or metastatic disease within the chest, abdomen or pelvis. 6. Emphysema (ICD10-J43.9). 7. Coronary artery disease. Aortic Atherosclerosis (ICD10-I70.0). Electronically Signed   By: Eddie Candle M.D.   On: 10/15/2019 09:23    ASSESSMENT AND PLAN: This is a very pleasant 69 years old white female with metastatic non-small cell lung cancer diagnosed in October 2007 status post concurrent chemoradiation followed by consolidation chemotherapy and she is currently on maintenance treatment with single agent Alimta status post  210 cycles. The patient continues to tolerate this treatment well with no concerning adverse effects. She had repeat CT scan of the chest, abdomen pelvis performed recently.  I personally and independently reviewed the scans and discussed the results with the patient today. Her scan showed no concerning findings for disease progression. I recommended for her to continue her current treatment with Alimta and she will proceed with cycle 211 today. The patient will come back for follow-up visit in 3 weeks for evaluation before the next cycle of her treatment. For pain management she will continue her current treatment with MS Contin and MSIR or and I will give her a refill of MS Contin today. The patient was advised to call immediately if she has any concerning symptoms in the interval.  The patient voices understanding of current disease status and treatment options and is in agreement with the current care plan. All questions were answered. The patient knows to call the clinic with any problems, questions or concerns. We can certainly see the patient much sooner if necessary.  Disclaimer: This note was dictated with voice recognition software. Similar sounding words can inadvertently be transcribed and may not be corrected upon review.

## 2019-10-18 NOTE — Patient Instructions (Signed)

## 2019-10-18 NOTE — Patient Instructions (Signed)
[place] Discharge Instructions for Patients Receiving Chemotherapy  Today you received the following chemotherapy agents: pemetrexed.  To help prevent nausea and vomiting after your treatment, we encourage you to take your nausea medication as directed.   If you develop nausea and vomiting that is not controlled by your nausea medication, call the clinic.   BELOW ARE SYMPTOMS THAT SHOULD BE REPORTED IMMEDIATELY:  *FEVER GREATER THAN 100.5 F  *CHILLS WITH OR WITHOUT FEVER  NAUSEA AND VOMITING THAT IS NOT CONTROLLED WITH YOUR NAUSEA MEDICATION  *UNUSUAL SHORTNESS OF BREATH  *UNUSUAL BRUISING OR BLEEDING  TENDERNESS IN MOUTH AND THROAT WITH OR WITHOUT PRESENCE OF ULCERS  *URINARY PROBLEMS  *BOWEL PROBLEMS  UNUSUAL RASH Items with * indicate a potential emergency and should be followed up as soon as possible.  Feel free to call the clinic should you have any questions or concerns. The clinic phone number is [phone].  Please show the [place] at check-in to the Emergency Department and triage nurse.

## 2019-10-19 ENCOUNTER — Other Ambulatory Visit: Payer: Medicare Other

## 2019-10-19 ENCOUNTER — Ambulatory Visit: Payer: Medicare Other

## 2019-10-19 ENCOUNTER — Ambulatory Visit: Payer: Medicare Other | Admitting: Internal Medicine

## 2019-10-22 NOTE — Chronic Care Management (AMB) (Signed)
Unsuccessful phone outreach attempt to patient. Left Voicemail with call back information to reschedule.   Sherre Poot, PharmD, [facility] Clinical Pharmacist [facility] [phone] (office) [phone] (mobile)

## 2019-10-25 ENCOUNTER — Ambulatory Visit: Payer: Medicare Other

## 2019-10-25 ENCOUNTER — Other Ambulatory Visit: Payer: Self-pay | Admitting: Family Medicine

## 2019-10-26 NOTE — Telephone Encounter (Signed)
Last OV 02/26/19 Last fill 09/24/18  #90/3 Pt need a follow up visit with PCP, before any other refills.

## 2019-10-28 ENCOUNTER — Other Ambulatory Visit: Payer: Self-pay | Admitting: Internal Medicine

## 2019-10-28 DIAGNOSIS — C3411 Malignant neoplasm of upper lobe, right bronchus or lung: Secondary | ICD-10-CM

## 2019-11-01 ENCOUNTER — Telehealth: Payer: Medicare Other

## 2019-11-01 NOTE — Chronic Care Management (AMB) (Deleted)
Chronic Care Management Pharmacy  Name: Danielle [name]  MRN: [id number] DOB: May 16, 1950  Chief Complaint/ HPI  Danielle [name],  69 y.o., female presents for their Follow-Up CCM visit with the clinical pharmacist via telephone.  PCP : [name], MD   Their chronic conditions include: hypertension, hyperlipidemia, diabetes, metastatic lung cancer   Patient concerns: denies medication concerns   Office Visits:  09/03/19 Diona Browner - trial of Spriva with albuterol for rescue. Pneumonia 23 vaccine given.   02/26/19: [facility] - decline in GFR over past month, no new medications except Advil and Tylenol for surgery, restart Boost and increase liquids, avoids NSAIDs, BP low today, increase fluids, DM controlled, set up annual eye exam   Consult Visit:  10/18/19 - Oncology - proceed with [place] therapy.   09/28/19: Oncology - proceed with Almitra therapy.   09/07/19: Oncology - proceed with [place] therapy. Shingrix vaccine recommended. Coordinating third COVID vaccine.   08/17/19: Oncology - proceed with Almitra therapy. Prescribing morphine immediate and extended release for pain and furosemide prn swelling.   07/27/19: Oncology - proceed with Almitra therapy.   07/07/19: Oncology - proceed with Almitra therapy. Monitor blood pressure at home.   06/14/19: Oncology - proceed with Almitra therapy. Take blood pressure medication at home for hypertension.   05/25/19: Oncology - proceed with Almitra therapy. Re-evaluate in 3 weeks.   05/06/19: Eye exam.   05/02/19: Oncology - continue cycle of maintenance Alimtra chemotherapy as scheduled.   04/13/19: Oncology - Metastatic non-small cell lung cancer, Alimta maintenance chemotherapy, Zofran PRN nausea   Allergies  Allergen Reactions  . Contrast Media [Iodinated Diagnostic Agents] Hives and Other (See Comments)    No reaction provided.  . Iohexol Hives     pt needs 13 hour prep--prev hives; added allergy 06/02/07 slg   . Sulfa  Drugs Cross Reactors Hives  . Co-Trimoxazole Injection [Sulfamethoxazole-Trimethoprim] Rash   SDOH Screenings   Alcohol Screen: Low Risk   . Last Alcohol Screening Score (AUDIT): 0  Depression (PHQ2-9): Low Risk   . PHQ-2 Score: 0  Financial Resource Strain: Low Risk   . Difficulty of Paying Living Expenses: Not hard at all  Food Insecurity: No Food Insecurity  . Worried About Charity fundraiser in the Last Year: Never true  . Ran Out of Food in the Last Year: Never true  Housing: Low Risk   . Last Housing Risk Score: 0  Physical Activity: Inactive  . Days of Exercise per Week: 0 days  . Minutes of Exercise per Session: 0 min  Social Connections:   . Frequency of Communication with Friends and Family: Not on file  . Frequency of Social Gatherings with Friends and Family: Not on file  . Attends Religious Services: Not on file  . Active Member of Clubs or Organizations: Not on file  . Attends Archivist Meetings: Not on file  . Marital Status: Not on file  Stress: No Stress Concern Present  . Feeling of Stress : Not at all  Tobacco Use: Medium Risk  . Smoking Tobacco Use: Former Smoker  . Smokeless Tobacco Use: Never Used  Transportation Needs: No Transportation Needs  . Lack of Transportation (Medical): No  . Lack of Transportation (Non-Medical): No     Medications: Outpatient Encounter Medications as of 11/01/2019  Medication Sig  . albuterol (VENTOLIN HFA) 108 (90 Base) MCG/ACT inhaler Inhale 2 puffs into the lungs every 6 (six) hours as needed for wheezing or shortness  of breath.  Marland Kitchen amLODipine (NORVASC) 10 MG tablet TAKE 1 TABLET BY MOUTH EVERY DAY  . aspirin 325 MG EC tablet TAKE 1 TABLET BY MOUTH EVERY DAY  . atorvastatin (LIPITOR) 40 MG tablet TAKE 1 TABLET BY MOUTH EVERY DAY AT 6 PM  . diphenhydrAMINE (BENADRYL) 25 MG tablet Take 1 tablet (25 mg total) by mouth as directed. 2 hours before CT scan.  . FeFum-FePoly-FA-B Cmp-C-Biot (INTEGRA PLUS) CAPS TAKE 1  CAPSULE BY MOUTH EVERY DAY IN THE MORNING  . fluticasone (FLONASE) 50 MCG/ACT nasal spray Place 1-2 sprays into both nostrils daily as needed.  . folic acid (FOLVITE) 1 MG tablet TAKE 1 TABLET BY MOUTH EVERY DAY  . furosemide (LASIX) 20 MG tablet TAKE 1 TABLET BY MOUTH EVERY DAY AS NEEDED FOR SWELLING  . lidocaine-prilocaine (EMLA) cream Apply to the Port-A-Cath site 30-60-minute before treatment.  Marland Kitchen lisinopril (ZESTRIL) 20 MG tablet TAKE 2 TABLETS BY MOUTH EVERY DAY  . morphine (MS CONTIN) 60 MG 12 hr tablet Take 1 tablet (60 mg total) by mouth 2 (two) times daily.  Marland Kitchen morphine (MSIR) 30 MG tablet Take 1 tablet (30 mg total) by mouth every 4 (four) hours as needed (breakthrough pain).  . Olopatadine HCl 0.2 % SOLN INSTILL 1 DROP INTO AFFECTED EYE(S) EVERY DAY  . predniSONE (DELTASONE) 50 MG tablet 1 tablet by mouth 13 hours ,7 hours and 1 hour  before CT scan  . triamcinolone (KENALOG) 0.025 % ointment Apply 1 application topically 2 (two) times daily.  Marland Kitchen umeclidinium bromide (INCRUSE ELLIPTA) 62.5 MCG/INH AEPB Inhale 1 puff into the lungs daily.  . [DISCONTINUED] ondansetron (ZOFRAN) 8 MG tablet TAKE 1 TABLET BY MOUTH EVERY 8 HOURS AS NEEDED FOR NAUSEA AND VOMITING   No facility-administered encounter medications on file as of 11/01/2019.   Current Diagnosis/Assessment: Goals    . Increase physical activity     Starting 07/27/15, I will attempt to walk for at least 30 min daily as tolerated.     . Patient Stated     Starting 08/05/2017, I will continue to take medications as prescribed.     . Patient Stated     08/31/2019, I will maintain and continue medications as prescribed.     . Pharmacy Care Plan     CARE PLAN ENTRY  Current Barriers:  . Chronic Disease Management support, education, and care coordination needs related to hypertension, hyperlipidemia  Pharmacist Clinical Goal(s):  Marland Kitchen Over the next 6 months, patient will work with PharmD and primary care provider to address the  following goals: o Hypertension: Maintain blood pressure goal of less than 140/90 mmHg o Cholesterol: Maintain LDL cholesterol within goal of less than 70 mg/dL  Interventions: . Comprehensive medication review performed . Reviewed vaccine recommendations: Shingrix 2-dose series, COVID-19 vaccine  Patient Self Care Activities:  For the next 6 months until follow up visit:  . Monitor blood pressure weekly and contact office if persistent low blood pressure readings  . Adhere to heart healthy diet such as the DASH diet   Initial goal documentation      Hypertension   CMP Latest Ref Rng & Units 10/18/2019 09/28/2019 09/07/2019  Glucose 70 - 99 mg/dL 114(H) 123(H) 116(H)  BUN 8 - 23 mg/dL '11 11 12  ' Creatinine 0.44 - 1.00 mg/dL 0.79 0.73 0.76  Sodium 135 - 145 mmol/L 136 138 138  Potassium 3.5 - 5.1 mmol/L 4.6 4.4 4.3  Chloride 98 - 111 mmol/L 105 105 105  CO2  22 - 32 mmol/L '27 25 26  ' Calcium 8.9 - 10.3 mg/dL 9.5 9.4 10.5(H)  Total Protein 6.5 - 8.1 g/dL 6.9 6.8 7.2  Total Bilirubin 0.3 - 1.2 mg/dL 0.3 0.3 0.5  Alkaline Phos 38 - 126 U/L 69 75 77  AST 15 - 41 U/L '22 22 24  ' ALT 0 - 44 U/L '14 16 9   ' Office blood pressures are: BP Readings from Last 3 Encounters:  10/18/19 (!) 145/60  09/28/19 (!) 160/66  09/07/19 (!) 145/64   BP goal < 140/90 mmHg Patient has failed these meds in the past: none  Patient checks BP at home 1-2x per week Patient home BP readings are ranging: 142/52 mmHg, 86 bpm (this morning), denies any hypotension   Patient is currently controlled on the following medications:   Amlodipine 10 mg - 1 tablet daily  Lisinopril 20 mg - 2 tablets daily  Furosemide 20 mg - 1 tablet PRN swelling   We discussed: hasn't needed furosemide in a while, denies swelling   Plan: Continue current medications  Hyperlipidemia   Lipid Panel     Component Value Date/Time   CHOL 122 02/19/2019 0733   TRIG 107.0 02/19/2019 0733   HDL 41.00 02/19/2019 0733   CHOLHDL 3  02/19/2019 0733   VLDL 21.4 02/19/2019 0733   LDLCALC 59 02/19/2019 0733   LDLDIRECT 105.5 08/06/2013 0750    LDL goal < 70 (CVA) Patient has failed these meds in past: none  Patient is currently controlled on the following medications:   Atorvastatin 40 mg - 1 tablet daily  Aspirin 325 mg - 1 tablet daily (per neurology)  We discussed: sees neurology annually   Plan: Continue current medications  Diabetes   Recent Relevant Labs: Lab Results  Component Value Date/Time   HGBA1C 5.4 09/03/2019 10:59 AM   HGBA1C 5.9 02/19/2019 07:33 AM   HGBA1C 5.8 08/20/2018 09:15 AM   EGFR 52 (L) 12/24/2016 08:16 AM   EGFR 57 (L) 12/03/2016 07:37 AM   MICROALBUR 1.1 01/17/2010 08:44 AM    Checking BG: none  Patient has failed these meds in past: none  Patient is currently controlled on the following medications:   No pharmacotherapy  Last diabetic eye exam:  Lab Results  Component Value Date/Time   HMDIABEYEEXA No Retinopathy 05/24/2015 12:00 AM    Last diabetic foot exam:  Lab Results  Component Value Date/Time   HMDIABFOOTEX done 09/03/2019 12:00 AM    We discussed: eye appt scheduled for May 06, 2019  Plan: Continue control with diet and exercise   Chronic Pain   Patient has failed these meds in past: none reported Patient is currently controlled on the following medications:   Morphine 60 mg 12 hr - 1 tablet BID   Morphine 30 mg - 1 tablet q4h PRN pain   We discussed: taking morphine 12 hr BID, limits short acting dose due to nausea/upset stomach; denies adverse effects   Plan: Continue current medications  Vaccines   Reviewed and discussed patient's vaccination history.    Immunization History  Administered Date(s) Administered  . Influenza Split 10/01/2011  . Influenza Whole 11/08/2007, 12/07/2008  . Influenza,inj,Quad PF,6+ Mos 10/13/2012, 09/14/2013, 09/27/2014, 10/01/2016, 09/23/2017, 09/15/2018  . Moderna SARS-COVID-2 Vaccination 05/13/2019, 06/10/2019   . Pneumococcal Conjugate-13 08/28/2018  . Pneumococcal Polysaccharide-23 10/16/2005, 02/12/2011, 09/03/2019  . Td 01/07/2002, 01/08/2008   Plan: Recommended patient receive Shingrix (denies interest at this time due to concern for adverse effects); Plans to get COVID-19 vaccine  soon.   SDOH:   Financial Resource Strain: Low Risk   . Difficulty of Paying Living Expenses: Not hard at all   Medication Management  Misc: triamcinolone 0.025% ointment - BID, Zofran 8 mg - q8h PRN, olopatadine hcl 0.2% - 1 drop into affected eye daily - 1 drop in each eye daily   OTCs: folic acid 1 mg daily, flonase 50 mcg PRN, Integra plus daily   Pharmacy/Benefits: Medicare Part D ([place])/[facility]  Adherence: confirms, denies missed doses  Social support: lives with family, very supportive  Affordability: no concerns   CCM Follow Up: 10/25/19 at 9:00 AM (telephone) for 6 month medication review  Sherre Poot, PharmD, [facility] Clinical Pharmacist [place] [phone] (office) [phone] (mobile)

## 2019-11-09 ENCOUNTER — Inpatient Hospital Stay: Payer: Medicare Other

## 2019-11-09 ENCOUNTER — Other Ambulatory Visit: Payer: Self-pay

## 2019-11-09 ENCOUNTER — Inpatient Hospital Stay: Payer: Medicare Other | Attending: Physician Assistant | Admitting: Internal Medicine

## 2019-11-09 ENCOUNTER — Encounter: Payer: Self-pay | Admitting: Internal Medicine

## 2019-11-09 VITALS — BP 153/62 | HR 97 | Temp 98.2°F | Resp 18 | Ht 62.0 in | Wt 142.0 lb

## 2019-11-09 DIAGNOSIS — Z7982 Long term (current) use of aspirin: Secondary | ICD-10-CM | POA: Insufficient documentation

## 2019-11-09 DIAGNOSIS — Z23 Encounter for immunization: Secondary | ICD-10-CM | POA: Diagnosis not present

## 2019-11-09 DIAGNOSIS — M7989 Other specified soft tissue disorders: Secondary | ICD-10-CM | POA: Insufficient documentation

## 2019-11-09 DIAGNOSIS — C3491 Malignant neoplasm of unspecified part of right bronchus or lung: Secondary | ICD-10-CM | POA: Insufficient documentation

## 2019-11-09 DIAGNOSIS — Z87891 Personal history of nicotine dependence: Secondary | ICD-10-CM | POA: Diagnosis not present

## 2019-11-09 DIAGNOSIS — C7931 Secondary malignant neoplasm of brain: Secondary | ICD-10-CM | POA: Diagnosis not present

## 2019-11-09 DIAGNOSIS — I10 Essential (primary) hypertension: Secondary | ICD-10-CM | POA: Insufficient documentation

## 2019-11-09 DIAGNOSIS — Z79899 Other long term (current) drug therapy: Secondary | ICD-10-CM | POA: Diagnosis not present

## 2019-11-09 DIAGNOSIS — Z8673 Personal history of transient ischemic attack (TIA), and cerebral infarction without residual deficits: Secondary | ICD-10-CM | POA: Diagnosis not present

## 2019-11-09 DIAGNOSIS — R112 Nausea with vomiting, unspecified: Secondary | ICD-10-CM | POA: Diagnosis not present

## 2019-11-09 DIAGNOSIS — C3490 Malignant neoplasm of unspecified part of unspecified bronchus or lung: Secondary | ICD-10-CM

## 2019-11-09 DIAGNOSIS — Z95828 Presence of other vascular implants and grafts: Secondary | ICD-10-CM

## 2019-11-09 DIAGNOSIS — Z5111 Encounter for antineoplastic chemotherapy: Secondary | ICD-10-CM | POA: Insufficient documentation

## 2019-11-09 DIAGNOSIS — Z923 Personal history of irradiation: Secondary | ICD-10-CM | POA: Diagnosis not present

## 2019-11-09 DIAGNOSIS — C3411 Malignant neoplasm of upper lobe, right bronchus or lung: Secondary | ICD-10-CM

## 2019-11-09 LAB — CBC WITH DIFFERENTIAL (CANCER CENTER ONLY)
Abs Immature Granulocytes: 0.05 10*3/uL (ref 0.00–0.07)
Basophils Absolute: 0.1 10*3/uL (ref 0.0–0.1)
Basophils Relative: 0 %
Eosinophils Absolute: 0 10*3/uL (ref 0.0–0.5)
Eosinophils Relative: 0 %
HCT: 36.4 % (ref 36.0–46.0)
Hemoglobin: 11.7 g/dL — ABNORMAL LOW (ref 12.0–15.0)
Immature Granulocytes: 0 %
Lymphocytes Relative: 17 %
Lymphs Abs: 2.3 10*3/uL (ref 0.7–4.0)
MCH: 31.6 pg (ref 26.0–34.0)
MCHC: 32.1 g/dL (ref 30.0–36.0)
MCV: 98.4 fL (ref 80.0–100.0)
Monocytes Absolute: 1.6 10*3/uL — ABNORMAL HIGH (ref 0.1–1.0)
Monocytes Relative: 12 %
Neutro Abs: 9.5 10*3/uL — ABNORMAL HIGH (ref 1.7–7.7)
Neutrophils Relative %: 71 %
Platelet Count: 399 10*3/uL (ref 150–400)
RBC: 3.7 MIL/uL — ABNORMAL LOW (ref 3.87–5.11)
RDW: 14.6 % (ref 11.5–15.5)
WBC Count: 13.6 10*3/uL — ABNORMAL HIGH (ref 4.0–10.5)
nRBC: 0 % (ref 0.0–0.2)

## 2019-11-09 LAB — CMP (CANCER CENTER ONLY)
ALT: 13 U/L (ref 0–44)
AST: 23 U/L (ref 15–41)
Albumin: 3.4 g/dL — ABNORMAL LOW (ref 3.5–5.0)
Alkaline Phosphatase: 71 U/L (ref 38–126)
Anion gap: 11 (ref 5–15)
BUN: 26 mg/dL — ABNORMAL HIGH (ref 8–23)
CO2: 23 mmol/L (ref 22–32)
Calcium: 9.1 mg/dL (ref 8.9–10.3)
Chloride: 103 mmol/L (ref 98–111)
Creatinine: 1.2 mg/dL — ABNORMAL HIGH (ref 0.44–1.00)
GFR, Estimated: 50 mL/min — ABNORMAL LOW (ref >60)
Glucose, Bld: 126 mg/dL — ABNORMAL HIGH (ref 70–99)
Potassium: 4.5 mmol/L (ref 3.5–5.1)
Sodium: 137 mmol/L (ref 135–145)
Total Bilirubin: 0.5 mg/dL (ref 0.3–1.2)
Total Protein: 6.9 g/dL (ref 6.5–8.1)

## 2019-11-09 MED ORDER — SODIUM CHLORIDE 0.9 % IV SOLN
10.0000 mg | Freq: Once | INTRAVENOUS | Status: AC
  Administered 2019-11-09: 10 mg via INTRAVENOUS
  Filled 2019-11-09: qty 10

## 2019-11-09 MED ORDER — ONDANSETRON HCL 8 MG PO TABS
ORAL_TABLET | ORAL | Status: AC
  Filled 2019-11-09: qty 1

## 2019-11-09 MED ORDER — SODIUM CHLORIDE 0.9% FLUSH
10.0000 mL | INTRAVENOUS | Status: DC | PRN
  Administered 2019-11-09: 10 mL via INTRAVENOUS
  Filled 2019-11-09: qty 10

## 2019-11-09 MED ORDER — SODIUM CHLORIDE 0.9 % IV SOLN
Freq: Once | INTRAVENOUS | Status: AC
  Filled 2019-11-09: qty 250

## 2019-11-09 MED ORDER — SODIUM CHLORIDE 0.9% FLUSH
10.0000 mL | INTRAVENOUS | Status: DC | PRN
  Administered 2019-11-09: 10 mL
  Filled 2019-11-09: qty 10

## 2019-11-09 MED ORDER — HEPARIN SOD (PORK) LOCK FLUSH 100 UNIT/ML IV SOLN
500.0000 [IU] | Freq: Once | INTRAVENOUS | Status: AC | PRN
  Administered 2019-11-09: 500 [IU]
  Filled 2019-11-09: qty 5

## 2019-11-09 MED ORDER — ONDANSETRON HCL 8 MG PO TABS
8.0000 mg | ORAL_TABLET | Freq: Once | ORAL | Status: AC
  Administered 2019-11-09: 8 mg via ORAL

## 2019-11-09 MED ORDER — SODIUM CHLORIDE 0.9 % IV SOLN
500.0000 mg/m2 | Freq: Once | INTRAVENOUS | Status: AC
  Administered 2019-11-09: 800 mg via INTRAVENOUS
  Filled 2019-11-09: qty 20

## 2019-11-09 NOTE — Progress Notes (Signed)
[facility] [phone]   [phone]  OFFICE PROGRESS NOTE  [name], MD [place] [address]  DIAGNOSIS: Metastatic non-small cell lung cancer initially diagnosed as locally advanced stage IIIB with a right Pancoast tumor involving the vertebral body as well as prominent canal invasion with spinal cord compression in October 2007. The patient also has metastatic disease to the brain in April 2008.  PRIOR THERAPY: 1. Status post concurrent chemoradiation with weekly carboplatin and paclitaxel, last dose was given November 18, 2005. 2. 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 3. Status post gamma knife radiotherapy to a solitary brain lesion located in the superior frontal area of the brain at [facility] in April of 2008. 4. Status post palliative radiotherapy to the lateral abdominal wall metastatic lesion under the care of Dr. Lisbeth Renshaw, completed March of 2009. 5. Status post 6 cycles of systemic chemotherapy with carboplatin and Alimta. Last dose was given July 26, 2007 with disease stabilization. 6. Gamma knife stereotactic radiotherapy to 2 brain lesions one involving the right frontal dural based as well as right parietal lesion performed on 05/07/2012 under the care of Dr. Vallarie Mare at [facility].  CURRENT THERAPY: Maintenance systemic chemotherapy with Alimta 500 MG/M2 every 3 weeks, status post 211 cycles.  INTERVAL HISTORY: Danielle [name] 69 y.o. female returns to the clinic today for follow-up visit.  The patient is feeling fine today with no concerning complaints.  She denied having any chest pain, shortness of breath, cough or hemoptysis.  She denied having any fever or chills.  She has no nausea, vomiting, diarrhea or constipation.  She denied having any headache or visual changes.  She has been tolerating her treatment with  Alimta fairly well.  The patient is here today for evaluation before starting cycle #212 of her treatment.  MEDICAL HISTORY: Past Medical History:  Diagnosis Date  . Anemia   . 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 Carcinomas, basal cell 01/22/2019   two places on face  . CVA (cerebral vascular accident) ([place]) 04/2014   pt states had 2 cva's within 2 wks  . DJD (degenerative joint disease), cervical   . Fibromyalgia   . H/O: pneumonia   . History of tobacco abuse quit 10/08   on nicotine patch  . Hypertension   . Hypokalemia   . Lung cancer ([place]) dx'd 09/2005   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 had chemo and radiation for lung ca  . Thrush 12/11/2010    ALLERGIES:  is allergic to contrast media [iodinated diagnostic agents], iohexol, sulfa drugs cross reactors, and co-trimoxazole injection [sulfamethoxazole-trimethoprim].  MEDICATIONS:  Current Outpatient Medications  Medication Sig Dispense Refill  . albuterol (VENTOLIN HFA) 108 (90 Base) MCG/ACT inhaler Inhale 2 puffs into the lungs every 6 (six) hours as needed for wheezing or shortness of breath. 8 g 2  . amLODipine (NORVASC) 10 MG tablet TAKE 1 TABLET BY MOUTH EVERY DAY 90 tablet 0  . aspirin 325 MG EC tablet TAKE 1 TABLET BY MOUTH EVERY DAY 90 tablet 0  . atorvastatin (LIPITOR) 40 MG tablet TAKE 1 TABLET BY MOUTH EVERY DAY AT 6 PM 90 tablet 1  . diphenhydrAMINE (BENADRYL) 25 MG tablet Take 1 tablet (25 mg total) by mouth as directed. 2 hours before CT scan. 5 tablet 0  . FeFum-FePoly-FA-B Cmp-C-Biot (INTEGRA PLUS) CAPS TAKE 1 CAPSULE  BY MOUTH EVERY DAY IN THE MORNING 90 capsule 3  . fluticasone (FLONASE) 50 MCG/ACT nasal spray Place 1-2 sprays into both nostrils daily as needed. 16 g 5  . folic acid (FOLVITE) 1 MG tablet TAKE 1 TABLET BY MOUTH EVERY DAY 90 tablet 1  . furosemide (LASIX) 20 MG tablet TAKE 1 TABLET BY MOUTH EVERY DAY AS NEEDED FOR SWELLING 20 tablet 1  . lidocaine-prilocaine (EMLA)  cream Apply to the Port-A-Cath site 30-60-minute before treatment. 30 g 0  . lisinopril (ZESTRIL) 20 MG tablet TAKE 2 TABLETS BY MOUTH EVERY DAY 180 tablet 3  . morphine (MS CONTIN) 60 MG 12 hr tablet Take 1 tablet (60 mg total) by mouth 2 (two) times daily. 60 tablet 0  . morphine (MSIR) 30 MG tablet Take 1 tablet (30 mg total) by mouth every 4 (four) hours as needed (breakthrough pain). 60 tablet 0  . Olopatadine HCl 0.2 % SOLN INSTILL 1 DROP INTO AFFECTED EYE(S) EVERY DAY 2.5 mL 1  . predniSONE (DELTASONE) 50 MG tablet 1 tablet by mouth 13 hours ,7 hours and 1 hour  before CT scan 3 tablet 4  . triamcinolone (KENALOG) 0.025 % ointment Apply 1 application topically 2 (two) times daily. 30 g 0  . umeclidinium bromide (INCRUSE ELLIPTA) 62.5 MCG/INH AEPB Inhale 1 puff into the lungs daily. 1 each 11   No current facility-administered medications for this visit.    SURGICAL HISTORY:  Past Surgical History:  Procedure Laterality Date  . CERVICAL LAMINECTOMY  1995  . KNEE SURGERY  1990   Left x 2  . 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 LOOP RECORDER IMPLANT N/A 04/19/2014   Procedure: LOOP RECORDER IMPLANT;  Surgeon: Thompson Grayer, MD;  Location: [facility] CATH LAB;  Service: Cardiovascular;  Laterality: N/A;  . OTHER SURGICAL HISTORY  2008   Gamma knife surgery to remove brain met   . PORTACATH PLACEMENT  -[name]   TIP IN CAVOATRIAL JUNCTION  . TEE WITHOUT CARDIOVERSION N/A 04/19/2014   Procedure: TRANSESOPHAGEAL ECHOCARDIOGRAM (TEE);  Surgeon: Larey Dresser, MD;  Location: [facility];  Service: Cardiovascular;  Laterality: N/A;    REVIEW OF SYSTEMS:  A comprehensive review of systems was negative except for: Constitutional: positive for fatigue   PHYSICAL EXAMINATION: General appearance: alert, cooperative, fatigued and no distress Head: Normocephalic, without obvious abnormality, atraumatic Neck: no adenopathy, no JVD, supple, symmetrical, trachea midline  and thyroid not enlarged, symmetric, no tenderness/mass/nodules Lymph nodes: Cervical, supraclavicular, and axillary nodes normal. Resp: clear to auscultation bilaterally Back: symmetric, no curvature. ROM normal. No CVA tenderness. Cardio: regular rate and rhythm, S1, S2 normal, no murmur, click, rub or gallop GI: soft, non-tender; bowel sounds normal; no masses,  no organomegaly Extremities: extremities normal, atraumatic, no cyanosis or edema  ECOG PERFORMANCE STATUS: 1 - Symptomatic but completely ambulatory  Blood pressure (!) 153/62, pulse 97, temperature 98.2 F (36.8 C), temperature source Tympanic, resp. rate 18, height _0  (1.575 m), weight 142 lb (64.4 kg), SpO2 95 %.  LABORATORY DATA: Lab Results  Component Value Date   WBC 13.6 (H) 11/09/2019   HGB 11.7 (L) 11/09/2019   HCT 36.4 11/09/2019   MCV 98.4 11/09/2019   PLT 399 11/09/2019      Chemistry      Component Value Date/Time   NA 137 11/09/2019 0910   NA 142 12/24/2016 0816   K 4.5 11/09/2019 0910   K 4.4 12/24/2016  0816   CL 103 11/09/2019 0910   CL 106 06/30/2012 1004   CO2 23 11/09/2019 0910   CO2 28 12/24/2016 0816   BUN 26 (H) 11/09/2019 0910   BUN 15.8 12/24/2016 0816   CREATININE 1.20 (H) 11/09/2019 0910   CREATININE 1.1 12/24/2016 0816      Component Value Date/Time   CALCIUM 9.1 11/09/2019 0910   CALCIUM 9.6 12/24/2016 0816   ALKPHOS 71 11/09/2019 0910   ALKPHOS 80 12/24/2016 0816   AST 23 11/09/2019 0910   AST 20 12/24/2016 0816   ALT 13 11/09/2019 0910   ALT 11 12/24/2016 0816   BILITOT 0.5 11/09/2019 0910   BILITOT 0.34 12/24/2016 0816       RADIOGRAPHIC STUDIES: CT CHEST ABDOMEN PELVIS W CONTRAST  Result Date: 10/15/2019 CLINICAL DATA:  Metastatic right lung cancer restaging EXAM: CT CHEST, ABDOMEN, AND PELVIS WITH CONTRAST TECHNIQUE: Multidetector CT imaging of the chest, abdomen and pelvis was performed following the standard protocol during bolus administration of intravenous  contrast. CONTRAST:  138m OMNIPAQUE IOHEXOL 300 MG/ML SOLN, additional oral enteric contrast COMPARISON:  04/09/2019 FINDINGS: CT CHEST FINDINGS Cardiovascular: Right chest port catheter. Aortic atherosclerosis. Normal heart size. Three-vessel coronary artery calcifications. No pericardial effusion. Mediastinum/Nodes: No enlarged mediastinal, hilar, or axillary lymph nodes. Thyroid gland, trachea, and esophagus demonstrate no significant findings. Lungs/Pleura: Moderate centrilobular emphysema. Unchanged post treatment fibrotic appearance of the perihilar and suprahilar right upper lobe. Stable, benign calcified nodule of the posterior right upper lobe measuring 3 mm (series 6, image 46). Stable irregular nodule of the superior segment left lower lobe measuring 3 mm (series 6, image 51). Unchanged trace right pleural effusion and associated atelectasis. Musculoskeletal: No chest wall mass. Implantable loop recorder. Unchanged high-grade wedge deformity of the T3 vertebral body. Redemonstrated vertebroplasty of T4 and T7 without significant height loss deformity. CT ABDOMEN PELVIS FINDINGS Hepatobiliary: No solid liver abnormality is seen. No gallstones, gallbladder wall thickening, or biliary dilatation. Pancreas: Unremarkable. No pancreatic ductal dilatation or surrounding inflammatory changes. Spleen: Normal in size without significant abnormality. Adrenals/Urinary Tract: Adrenal glands are unremarkable. Kidneys are normal, without renal calculi, solid lesion, or hydronephrosis. Bladder is unremarkable. Stomach/Bowel: Stomach is within normal limits. Appendix appears normal. No evidence of bowel wall thickening, distention, or inflammatory changes. Vascular/Lymphatic: No significant vascular findings are present. No enlarged abdominal or pelvic lymph nodes. Reproductive: No mass or other abnormality. Other: No abdominal wall hernia or abnormality. No abdominopelvic ascites. Musculoskeletal: No acute or  significant osseous findings. Previously described sclerosis of the L1 vertebral body is not appreciated on this examination. IMPRESSION: 1. Unchanged post treatment fibrotic appearance of the perihilar and suprahilar right upper lobe. 2. Stable trace right pleural effusion and associated atelectasis. 3. Stable small pulmonary nodules, very likely benign and incidental. Attention on follow-up. 4. Redemonstrated high-grade wedge deformity of T3 with vertebroplasty of T4 and T7. 5. No evidence of recurrent or metastatic disease within the chest, abdomen or pelvis. 6. Emphysema (ICD10-J43.9). 7. Coronary artery disease. Aortic Atherosclerosis (ICD10-I70.0). Electronically Signed   By: AEddie CandleM.D.   On: 10/15/2019 09:23    ASSESSMENT AND PLAN: This is a very pleasant 69years old white female with metastatic non-small cell lung cancer diagnosed in October 2007 status post concurrent chemoradiation followed by consolidation chemotherapy and she is currently on maintenance treatment with single agent Alimta status post 211 cycles. The patient continues to tolerate her treatment well with no concerning adverse effect except for mild fatigue. I recommended for  her to proceed with cycle #212 today as planned. She will come back for follow-up visit in 3 weeks for evaluation before cycle 213. For pain management she will continue her current treatment with MS Contin and MSIR or and I will give her a refill of MS Contin today. The patient was advised to call immediately if she has any concerning symptoms in the interval. The patient voices understanding of current disease status and treatment options and is in agreement with the current care plan. All questions were answered. The patient knows to call the clinic with any problems, questions or concerns. We can certainly see the patient much sooner if necessary.  Disclaimer: This note was dictated with voice recognition software. Similar sounding words can  inadvertently be transcribed and may not be corrected upon review.

## 2019-11-09 NOTE — Patient Instructions (Signed)
[facility] Discharge Instructions for Patients Receiving Chemotherapy  Today you received the following chemotherapy agents: pemetrexed.  To help prevent nausea and vomiting after your treatment, we encourage you to take your nausea medication as directed.   If you develop nausea and vomiting that is not controlled by your nausea medication, call the clinic.   BELOW ARE SYMPTOMS THAT SHOULD BE REPORTED IMMEDIATELY:  *FEVER GREATER THAN 100.5 F  *CHILLS WITH OR WITHOUT FEVER  NAUSEA AND VOMITING THAT IS NOT CONTROLLED WITH YOUR NAUSEA MEDICATION  *UNUSUAL SHORTNESS OF BREATH  *UNUSUAL BRUISING OR BLEEDING  TENDERNESS IN MOUTH AND THROAT WITH OR WITHOUT PRESENCE OF ULCERS  *URINARY PROBLEMS  *BOWEL PROBLEMS  UNUSUAL RASH Items with * indicate a potential emergency and should be followed up as soon as possible.  Feel free to call the clinic should you have any questions or concerns. The clinic phone number is [phone].  Please show the [facility] at check-in to the Emergency Department and triage nurse.

## 2019-11-11 ENCOUNTER — Telehealth: Payer: Self-pay | Admitting: Internal Medicine

## 2019-11-11 NOTE — Telephone Encounter (Signed)
Scheduled per los. Called, not able to leave msg. Mailed printout  

## 2019-11-18 ENCOUNTER — Telehealth: Payer: Self-pay | Admitting: Medical Oncology

## 2019-11-18 NOTE — Telephone Encounter (Signed)
Requested a refill MS contin 60 mg .

## 2019-11-19 ENCOUNTER — Other Ambulatory Visit: Payer: Self-pay | Admitting: Internal Medicine

## 2019-11-19 DIAGNOSIS — C3410 Malignant neoplasm of upper lobe, unspecified bronchus or lung: Secondary | ICD-10-CM

## 2019-11-19 DIAGNOSIS — C3490 Malignant neoplasm of unspecified part of unspecified bronchus or lung: Secondary | ICD-10-CM

## 2019-11-19 MED ORDER — MORPHINE SULFATE ER 60 MG PO TBCR
60.0000 mg | EXTENDED_RELEASE_TABLET | Freq: Two times a day (BID) | ORAL | 0 refills | Status: DC
Start: 2019-11-19 — End: 2019-12-22

## 2019-11-26 NOTE — Progress Notes (Signed)
[facility] OFFICE PROGRESS NOTE  [name], MD [place] [id number]  DIAGNOSIS: Metastatic non-small cell lung cancer initially diagnosed as locally advanced stage IIIB with a right Pancoast tumor involving the vertebral body as well as prominent canal invasion with spinal cord compression in October 2007. The patient also has metastatic disease to the brain in April 2008.  PRIOR THERAPY: 1. Status post concurrent chemoradiation with weekly carboplatin and paclitaxel, last dose was given November 18, 2005. 2. 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 3. Status post gamma knife radiotherapy to a solitary brain lesion located in the superior frontal area of the brain at [facility] in April of 2008. 4. Status post palliative radiotherapy to the lateral abdominal wall metastatic lesion under the care of Dr. Lisbeth Renshaw, completed March of 2009. 5. Status post 6 cycles of systemic chemotherapy with carboplatin and Alimta. Last dose was given July 26, 2007 with disease stabilization. 6. Gamma knife stereotactic radiotherapy to 2 brain lesions one involving the right frontal dural based as well as right parietal lesion performed on 05/07/2012 under the care of Dr. Vallarie Mare at [facility].  CURRENT THERAPY: Maintenance systemic chemotherapy with Alimta 500 MG/M2 every 3 weeks, status post 212cycles.  INTERVAL HISTORY: Danielle [name] 69 y.o. female returns to the clinic for a follow up visit accompanied by her daughter.The patient is feeling well today without any concerning complaints. She is interested in getting her flu shot today. She is tolerating her treatment with maintenance Alimta well without any adverse side effects except for occasional nausea/vomiting. She had nausea/vomiting for ~4 days after the last treatment. She takes zofran for nausea.She denies any feveror  chills. She gained weight. She reportedly has baseline night sweats for several years, which are unchanged.She denies any hemoptysis, cough,chest pain. She reports her baseline shortness of breath with exertion.She denies any diarrhea or constipation. She denies any headaches or visual changes.She is here today for evaluationbeforestarting cycle #213  MEDICAL HISTORY: Past Medical History:  Diagnosis Date  . Anemia   . 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 Carcinomas, basal cell 01/22/2019   two places on face  . CVA (cerebral vascular accident) ([place]) 04/2014   pt states had 2 cva's within 2 wks  . DJD (degenerative joint disease), cervical   . Fibromyalgia   . H/O: pneumonia   . History of tobacco abuse quit 10/08   on nicotine patch  . Hypertension   . Hypokalemia   . Lung cancer ([place]) dx'd 09/2005   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 had chemo and radiation for lung ca  . Thrush 12/11/2010    ALLERGIES:  is allergic to contrast media [iodinated diagnostic agents], iohexol, sulfa drugs cross reactors, and co-trimoxazole injection [sulfamethoxazole-trimethoprim].  MEDICATIONS:  Current Outpatient Medications  Medication Sig Dispense Refill  . albuterol (VENTOLIN HFA) 108 (90 Base) MCG/ACT inhaler Inhale 2 puffs into the lungs every 6 (six) hours as needed for wheezing or shortness of breath. 8 g 2  . amLODipine (NORVASC) 10 MG tablet TAKE 1 TABLET BY MOUTH EVERY DAY 90 tablet 0  . aspirin 325 MG EC tablet TAKE 1 TABLET BY MOUTH EVERY DAY 90 tablet 0  . atorvastatin (LIPITOR) 40 MG tablet TAKE 1 TABLET BY MOUTH EVERY DAY AT 6 PM 90 tablet 1  . diphenhydrAMINE (BENADRYL) 25 MG tablet Take 1 tablet (25 mg total) by mouth  as directed. 2 hours before CT scan. 5 tablet 0  . FeFum-FePoly-FA-B Cmp-C-Biot (INTEGRA PLUS) CAPS TAKE 1 CAPSULE BY MOUTH EVERY DAY IN THE MORNING 90 capsule 3  . fluticasone (FLONASE) 50 MCG/ACT nasal spray Place 1-2 sprays into  both nostrils daily as needed. 16 g 5  . folic acid (FOLVITE) 1 MG tablet TAKE 1 TABLET BY MOUTH EVERY DAY 90 tablet 1  . furosemide (LASIX) 20 MG tablet TAKE 1 TABLET BY MOUTH EVERY DAY AS NEEDED FOR SWELLING 20 tablet 1  . lidocaine-prilocaine (EMLA) cream Apply to the Port-A-Cath site 30-60-minute before treatment. 30 g 0  . lisinopril (ZESTRIL) 20 MG tablet TAKE 2 TABLETS BY MOUTH EVERY DAY 180 tablet 3  . morphine (MS CONTIN) 60 MG 12 hr tablet Take 1 tablet (60 mg total) by mouth 2 (two) times daily. 60 tablet 0  . morphine (MSIR) 30 MG tablet Take 1 tablet (30 mg total) by mouth every 4 (four) hours as needed (breakthrough pain). 60 tablet 0  . Olopatadine HCl 0.2 % SOLN INSTILL 1 DROP INTO AFFECTED EYE(S) EVERY DAY 2.5 mL 1  . ondansetron (ZOFRAN) 8 MG tablet Take 8 mg by mouth every 8 (eight) hours as needed for nausea or vomiting.    . predniSONE (DELTASONE) 50 MG tablet 1 tablet by mouth 13 hours ,7 hours and 1 hour  before CT scan 3 tablet 4  . triamcinolone (KENALOG) 0.025 % ointment Apply 1 application topically 2 (two) times daily. 30 g 0  . umeclidinium bromide (INCRUSE ELLIPTA) 62.5 MCG/INH AEPB Inhale 1 puff into the lungs daily. 1 each 11  . prochlorperazine (COMPAZINE) 10 MG tablet Take 1 tablet (10 mg total) by mouth every 6 (six) hours as needed. 30 tablet 2   No current facility-administered medications for this visit.    SURGICAL HISTORY:  Past Surgical History:  Procedure Laterality Date  . CERVICAL LAMINECTOMY  1995  . KNEE SURGERY  1990   Left x 2  . 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 LOOP RECORDER IMPLANT N/A 04/19/2014   Procedure: LOOP RECORDER IMPLANT;  Surgeon: Thompson Grayer, MD;  Location: [facility] CATH LAB;  Service: Cardiovascular;  Laterality: N/A;  . OTHER SURGICAL HISTORY  2008   Gamma knife surgery to remove brain met   . PORTACATH PLACEMENT  -[name]   TIP IN CAVOATRIAL JUNCTION  . TEE WITHOUT CARDIOVERSION N/A 04/19/2014    Procedure: TRANSESOPHAGEAL ECHOCARDIOGRAM (TEE);  Surgeon: Larey Dresser, MD;  Location: [facility];  Service: Cardiovascular;  Laterality: N/A;    REVIEW OF SYSTEMS:   Review of Systems  Constitutional: Negative for appetite change, chills, fatigue, fever and unexpected weight change.  HENT:Negative for mouth sores, nosebleeds, sore throat and trouble swallowing.  Eyes: Negative for eye problems and icterus.  Respiratory:Positive for baseline shortness of breath with exertion.Negative for cough, hemoptysis, and wheezing.  Cardiovascular: Positive for bilateral lower extremity swelling. Negative for chest pain. Gastrointestinal:Positive for occasional nausea and vomiting.Negative for abdominal pain, constipation, and diarrhea. Genitourinary: Negative for bladder incontinence, difficulty urinating, dysuria, frequency and hematuria.  Musculoskeletal: Negative for back pain, gait problem, neck pain and neck stiffness.  Skin:Positive for several scars on her skin.Negative for itching and rash.  Neurological: Negative for dizziness, extremity weakness, gait problem, headaches, light-headedness and seizures.  Hematological: Negative for adenopathy. Does not bruise/bleed easily.  Psychiatric/Behavioral: Negative for confusion, depression and sleep disturbance. The patient is not nervous/anxious.  PHYSICAL EXAMINATION:  Blood pressure (!) 131/55, pulse (!) 113, temperature 97.8 F (36.6 C), temperature source Tympanic, resp. rate 16, height '5\' 2"'  (1.575 m), weight 145 lb 9.6 oz (66 kg), SpO2 96 %.  ECOG PERFORMANCE STATUS: 1 - Symptomatic but completely ambulatory  Physical Exam  Constitutional: Oriented to person, place, and time and well-developed, well-nourished, and in no distress.  HENT:  Head: Normocephalic and atraumatic.  Mouth/Throat: Oropharynx is clear and moist. No oropharyngeal exudate.  Eyes: Conjunctivae are normal. Right eye exhibits no discharge. Left eye  exhibits no discharge. No scleral icterus.  Neck: Normal range of motion. Neck supple.  Cardiovascular: Normal rate, regular rhythm, normal heart sounds and intact distal pulses.  Pulmonary/Chest: Effort normal and breath soundsquiet in all lung fields.No respiratory distress. No wheezes. No rales.  Abdominal: Soft. Bowel sounds are normal. Exhibits no distension and no mass. There is no tenderness.  Musculoskeletal: Positive for bilateral lower extremity edema. Normal range of motion.  Lymphadenopathy:  No cervical adenopathy.  Neurological: Alert and oriented to person, place, and time. Exhibits normal muscle tone. Gait normal. Coordination normal.  Skin: Skin is warm and dry. No rash noted. Not diaphoretic. No erythema. No pallor.  Psychiatric: Mood, memory and judgment normal.  Vitals reviewed.  LABORATORY DATA: Lab Results  Component Value Date   WBC 8.9 11/30/2019   HGB 10.7 (L) 11/30/2019   HCT 33.6 (L) 11/30/2019   MCV 98.5 11/30/2019   PLT 394 11/30/2019      Chemistry      Component Value Date/Time   NA 141 11/30/2019 1008   NA 142 12/24/2016 0816   K 4.2 11/30/2019 1008   K 4.4 12/24/2016 0816   CL 106 11/30/2019 1008   CL 106 06/30/2012 1004   CO2 25 11/30/2019 1008   CO2 28 12/24/2016 0816   BUN 16 11/30/2019 1008   BUN 15.8 12/24/2016 0816   CREATININE 0.86 11/30/2019 1008   CREATININE 1.1 12/24/2016 0816      Component Value Date/Time   CALCIUM 9.1 11/30/2019 1008   CALCIUM 9.6 12/24/2016 0816   ALKPHOS 71 11/30/2019 1008   ALKPHOS 80 12/24/2016 0816   AST 22 11/30/2019 1008   AST 20 12/24/2016 0816   ALT 13 11/30/2019 1008   ALT 11 12/24/2016 0816   BILITOT 0.4 11/30/2019 1008   BILITOT 0.34 12/24/2016 0816       RADIOGRAPHIC STUDIES:  No results found.   ASSESSMENT/PLAN:  This isa very pleasant 69 year old Caucasian female with metastatic non-small cell lung cancer who was initially diagnosed in October 2007. She is status post  current chemoradiation followed by consolidation chemotherapy.  She is currently undergoing weeklymaintainancechemotherapy with single agent Alimta 500 mg/m. She is status post212cycles.She is tolerating treatment well without any concerning complaintsexcept mild nausea/vomiting.  Labs were reviewed. Recommend that she proceed with cycle #213today as scheduled.  We will see her back for a follow up visit 3 weeks for evaluation before starting cycle #214.  We will arrange for her to have a flu shot while in the clinic today.   For her lower extremity swelling, she was encouraged to elevate her legs as well as use compression stockings. She was encouraged to decrease her intake of foods high in salt.   I have sent her a prescription for compazine to assist with control of her nausea. She was instructed to alternate between compazine and zofran if needed for better control of her nausea.   The patient was advised  to call immediately if she has any concerning symptoms in the interval. The patient voices understanding of current disease status and treatment options and is in agreement with the current care plan. All questions were answered. The patient knows to call the clinic with any problems, questions or concerns. We can certainly see the patient much sooner if necessary        No orders of the defined types were placed in this encounter.    [place], PA-C 11/30/19

## 2019-11-30 ENCOUNTER — Inpatient Hospital Stay: Payer: Medicare Other

## 2019-11-30 ENCOUNTER — Other Ambulatory Visit: Payer: Self-pay

## 2019-11-30 ENCOUNTER — Inpatient Hospital Stay (HOSPITAL_BASED_OUTPATIENT_CLINIC_OR_DEPARTMENT_OTHER): Payer: Medicare Other | Admitting: Physician Assistant

## 2019-11-30 VITALS — BP 131/55 | HR 113 | Temp 97.8°F | Resp 16 | Ht 62.0 in | Wt 145.6 lb

## 2019-11-30 VITALS — HR 89 | Resp 17

## 2019-11-30 DIAGNOSIS — C3490 Malignant neoplasm of unspecified part of unspecified bronchus or lung: Secondary | ICD-10-CM | POA: Diagnosis not present

## 2019-11-30 DIAGNOSIS — C7931 Secondary malignant neoplasm of brain: Secondary | ICD-10-CM | POA: Diagnosis not present

## 2019-11-30 DIAGNOSIS — R112 Nausea with vomiting, unspecified: Secondary | ICD-10-CM | POA: Diagnosis not present

## 2019-11-30 DIAGNOSIS — M7989 Other specified soft tissue disorders: Secondary | ICD-10-CM | POA: Diagnosis not present

## 2019-11-30 DIAGNOSIS — Z5111 Encounter for antineoplastic chemotherapy: Secondary | ICD-10-CM | POA: Diagnosis not present

## 2019-11-30 DIAGNOSIS — C3491 Malignant neoplasm of unspecified part of right bronchus or lung: Secondary | ICD-10-CM | POA: Diagnosis not present

## 2019-11-30 DIAGNOSIS — C3411 Malignant neoplasm of upper lobe, right bronchus or lung: Secondary | ICD-10-CM

## 2019-11-30 DIAGNOSIS — Z95828 Presence of other vascular implants and grafts: Secondary | ICD-10-CM

## 2019-11-30 DIAGNOSIS — Z23 Encounter for immunization: Secondary | ICD-10-CM | POA: Diagnosis not present

## 2019-11-30 LAB — CBC WITH DIFFERENTIAL (CANCER CENTER ONLY)
Abs Immature Granulocytes: 0.03 10*3/uL (ref 0.00–0.07)
Basophils Absolute: 0 10*3/uL (ref 0.0–0.1)
Basophils Relative: 0 %
Eosinophils Absolute: 0 10*3/uL (ref 0.0–0.5)
Eosinophils Relative: 0 %
HCT: 33.6 % — ABNORMAL LOW (ref 36.0–46.0)
Hemoglobin: 10.7 g/dL — ABNORMAL LOW (ref 12.0–15.0)
Immature Granulocytes: 0 %
Lymphocytes Relative: 21 %
Lymphs Abs: 1.9 10*3/uL (ref 0.7–4.0)
MCH: 31.4 pg (ref 26.0–34.0)
MCHC: 31.8 g/dL (ref 30.0–36.0)
MCV: 98.5 fL (ref 80.0–100.0)
Monocytes Absolute: 0.9 10*3/uL (ref 0.1–1.0)
Monocytes Relative: 10 %
Neutro Abs: 6 10*3/uL (ref 1.7–7.7)
Neutrophils Relative %: 69 %
Platelet Count: 394 10*3/uL (ref 150–400)
RBC: 3.41 MIL/uL — ABNORMAL LOW (ref 3.87–5.11)
RDW: 14.2 % (ref 11.5–15.5)
WBC Count: 8.9 10*3/uL (ref 4.0–10.5)
nRBC: 0 % (ref 0.0–0.2)

## 2019-11-30 LAB — CMP (CANCER CENTER ONLY)
ALT: 13 U/L (ref 0–44)
AST: 22 U/L (ref 15–41)
Albumin: 3 g/dL — ABNORMAL LOW (ref 3.5–5.0)
Alkaline Phosphatase: 71 U/L (ref 38–126)
Anion gap: 10 (ref 5–15)
BUN: 16 mg/dL (ref 8–23)
CO2: 25 mmol/L (ref 22–32)
Calcium: 9.1 mg/dL (ref 8.9–10.3)
Chloride: 106 mmol/L (ref 98–111)
Creatinine: 0.86 mg/dL (ref 0.44–1.00)
GFR, Estimated: >60 mL/min (ref >60)
Glucose, Bld: 167 mg/dL — ABNORMAL HIGH (ref 70–99)
Potassium: 4.2 mmol/L (ref 3.5–5.1)
Sodium: 141 mmol/L (ref 135–145)
Total Bilirubin: 0.4 mg/dL (ref 0.3–1.2)
Total Protein: 6.4 g/dL — ABNORMAL LOW (ref 6.5–8.1)

## 2019-11-30 MED ORDER — ONDANSETRON HCL 8 MG PO TABS
ORAL_TABLET | ORAL | Status: AC
  Filled 2019-11-30: qty 1

## 2019-11-30 MED ORDER — SODIUM CHLORIDE 0.9 % IV SOLN
Freq: Once | INTRAVENOUS | Status: AC
  Filled 2019-11-30: qty 250

## 2019-11-30 MED ORDER — SODIUM CHLORIDE 0.9 % IV SOLN
500.0000 mg/m2 | Freq: Once | INTRAVENOUS | Status: AC
  Administered 2019-11-30: 800 mg via INTRAVENOUS
  Filled 2019-11-30: qty 20

## 2019-11-30 MED ORDER — PROCHLORPERAZINE MALEATE 10 MG PO TABS: 10.0000 mg | ORAL_TABLET | Freq: Four times a day (QID) | ORAL | 2 refills | Status: DC | PRN

## 2019-11-30 MED ORDER — SODIUM CHLORIDE 0.9 % IV SOLN
10.0000 mg | Freq: Once | INTRAVENOUS | Status: AC
  Administered 2019-11-30: 10 mg via INTRAVENOUS
  Filled 2019-11-30: qty 10

## 2019-11-30 MED ORDER — INFLUENZA VAC A&B SA ADJ QUAD 0.5 ML IM PRSY
PREFILLED_SYRINGE | INTRAMUSCULAR | Status: AC
  Filled 2019-11-30: qty 0.5

## 2019-11-30 MED ORDER — HEPARIN SOD (PORK) LOCK FLUSH 100 UNIT/ML IV SOLN
500.0000 [IU] | Freq: Once | INTRAVENOUS | Status: AC | PRN
  Administered 2019-11-30: 500 [IU]
  Filled 2019-11-30: qty 5

## 2019-11-30 MED ORDER — ONDANSETRON HCL 8 MG PO TABS
8.0000 mg | ORAL_TABLET | Freq: Once | ORAL | Status: AC
  Administered 2019-11-30: 8 mg via ORAL

## 2019-11-30 MED ORDER — SODIUM CHLORIDE 0.9% FLUSH
10.0000 mL | INTRAVENOUS | Status: DC | PRN
  Administered 2019-11-30: 10 mL
  Filled 2019-11-30: qty 10

## 2019-11-30 MED ORDER — SODIUM CHLORIDE 0.9% FLUSH
10.0000 mL | INTRAVENOUS | Status: DC | PRN
  Administered 2019-11-30: 10 mL via INTRAVENOUS
  Filled 2019-11-30: qty 10

## 2019-11-30 MED ORDER — INFLUENZA VAC A&B SA ADJ QUAD 0.5 ML IM PRSY
0.5000 mL | PREFILLED_SYRINGE | Freq: Once | INTRAMUSCULAR | Status: AC
  Administered 2019-11-30: 0.5 mL via INTRAMUSCULAR

## 2019-11-30 NOTE — Patient Instructions (Signed)
[facility] Discharge Instructions for Patients Receiving Chemotherapy  Today you received the following chemotherapy agents: pemetrexed.  To help prevent nausea and vomiting after your treatment, we encourage you to take your nausea medication as directed.   If you develop nausea and vomiting that is not controlled by your nausea medication, call the clinic.   BELOW ARE SYMPTOMS THAT SHOULD BE REPORTED IMMEDIATELY:  *FEVER GREATER THAN 100.5 F  *CHILLS WITH OR WITHOUT FEVER  NAUSEA AND VOMITING THAT IS NOT CONTROLLED WITH YOUR NAUSEA MEDICATION  *UNUSUAL SHORTNESS OF BREATH  *UNUSUAL BRUISING OR BLEEDING  TENDERNESS IN MOUTH AND THROAT WITH OR WITHOUT PRESENCE OF ULCERS  *URINARY PROBLEMS  *BOWEL PROBLEMS  UNUSUAL RASH Items with * indicate a potential emergency and should be followed up as soon as possible.  Feel free to call the clinic should you have any questions or concerns. The clinic phone number is [phone].  Please show the [facility] at check-in to the Emergency Department and triage nurse.  Influenza Virus Vaccine injection What is this medicine? INFLUENZA VIRUS VACCINE (in floo EN zuh VAHY ruhs vak SEEN) helps to reduce the risk of getting influenza also known as the flu. The vaccine only helps protect you against some strains of the flu. This medicine may be used for other purposes; ask your health care provider or pharmacist if you have questions. COMMON BRAND NAME(S): Afluria, Afluria Quadrivalent, Agriflu, Alfuria, FLUAD, Fluarix, Fluarix Quadrivalent, Flublok, Flublok Quadrivalent, FLUCELVAX, FLUCELVAX Quadrivalent, Flulaval, Flulaval Quadrivalent, Fluvirin, Fluzone, Fluzone High-Dose, Fluzone Intradermal, Fluzone Quadrivalent What should I tell my health care provider before I take this medicine? They need to know if you have any of these conditions:  bleeding disorder like hemophilia  fever or infection  Guillain-Barre  syndrome or other neurological problems  immune system problems  infection with the human immunodeficiency virus (HIV) or AIDS  low blood platelet counts  multiple sclerosis  an unusual or allergic reaction to influenza virus vaccine, latex, other medicines, foods, dyes, or preservatives. Different brands of vaccines contain different allergens. Some may contain latex or eggs. Talk to your doctor about your allergies to make sure that you get the right vaccine.  pregnant or trying to get pregnant  breast-feeding How should I use this medicine? This vaccine is for injection into a muscle or under the skin. It is given by a health care professional. A copy of Vaccine Information Statements will be given before each vaccination. Read this sheet carefully each time. The sheet may change frequently. Talk to your healthcare provider to see which vaccines are right for you. Some vaccines should not be used in all age groups. Overdosage: If you think you have taken too much of this medicine contact a poison control center or emergency room at once. NOTE: This medicine is only for you. Do not share this medicine with others. What if I miss a dose? This does not apply. What may interact with this medicine?  chemotherapy or radiation therapy  medicines that lower your immune system like etanercept, anakinra, infliximab, and adalimumab  medicines that treat or prevent blood clots like warfarin  phenytoin  steroid medicines like prednisone or cortisone  theophylline  vaccines This list may not describe all possible interactions. Give your health care provider a list of all the medicines, herbs, non-prescription drugs, or dietary supplements you use. Also tell them if you smoke, drink alcohol, or use illegal drugs. Some items may interact with your medicine. What should  I watch for while using this medicine? Report any side effects that do not go away within 3 days to your doctor or health  care professional. Call your health care provider if any unusual symptoms occur within 6 weeks of receiving this vaccine. You may still catch the flu, but the illness is not usually as bad. You cannot get the flu from the vaccine. The vaccine will not protect against colds or other illnesses that may cause fever. The vaccine is needed every year. What side effects may I notice from receiving this medicine? Side effects that you should report to your doctor or health care professional as soon as possible:  allergic reactions like skin rash, itching or hives, swelling of the face, lips, or tongue Side effects that usually do not require medical attention (report to your doctor or health care professional if they continue or are bothersome):  fever  headache  muscle aches and pains  pain, tenderness, redness, or swelling at the injection site  tiredness This list may not describe all possible side effects. Call your doctor for medical advice about side effects. You may report side effects to FDA at 1-800-FDA-1088. Where should I keep my medicine? The vaccine will be given by a health care professional in a clinic, pharmacy, doctor's office, or other health care setting. You will not be given vaccine doses to store at home. NOTE: This sheet is a summary. It may not cover all possible information. If you have questions about this medicine, talk to your doctor, pharmacist, or health care provider.  2020 Elsevier/Gold Standard (2017-11-18 08:45:43)

## 2019-12-05 ENCOUNTER — Other Ambulatory Visit: Payer: Self-pay | Admitting: Internal Medicine

## 2019-12-05 DIAGNOSIS — C7931 Secondary malignant neoplasm of brain: Secondary | ICD-10-CM

## 2019-12-05 DIAGNOSIS — C3411 Malignant neoplasm of upper lobe, right bronchus or lung: Secondary | ICD-10-CM

## 2019-12-17 ENCOUNTER — Other Ambulatory Visit: Payer: Self-pay | Admitting: Internal Medicine

## 2019-12-20 ENCOUNTER — Ambulatory Visit
Admission: RE | Admit: 2019-12-20 | Discharge: 2019-12-20 | Disposition: A | Discharge status: Home / Self Care | Payer: Medicare Other | Source: Ambulatory Visit | Attending: Family Medicine | Admitting: Family Medicine

## 2019-12-20 ENCOUNTER — Other Ambulatory Visit: Payer: Self-pay

## 2019-12-20 DIAGNOSIS — M8588 Other specified disorders of bone density and structure, other site: Secondary | ICD-10-CM | POA: Diagnosis not present

## 2019-12-20 DIAGNOSIS — Z780 Asymptomatic menopausal state: Secondary | ICD-10-CM | POA: Diagnosis not present

## 2019-12-20 DIAGNOSIS — M810 Age-related osteoporosis without current pathological fracture: Secondary | ICD-10-CM | POA: Diagnosis not present

## 2019-12-20 DIAGNOSIS — E2839 Other primary ovarian failure: Secondary | ICD-10-CM

## 2019-12-21 ENCOUNTER — Encounter: Payer: Self-pay | Admitting: Internal Medicine

## 2019-12-21 ENCOUNTER — Inpatient Hospital Stay: Payer: Medicare Other | Attending: Physician Assistant | Admitting: Internal Medicine

## 2019-12-21 ENCOUNTER — Inpatient Hospital Stay: Payer: Medicare Other

## 2019-12-21 ENCOUNTER — Other Ambulatory Visit: Payer: Self-pay

## 2019-12-21 ENCOUNTER — Encounter: Payer: Self-pay | Admitting: Family Medicine

## 2019-12-21 VITALS — BP 139/53 | HR 96 | Temp 98.3°F | Resp 18 | Ht 62.0 in | Wt 146.7 lb

## 2019-12-21 DIAGNOSIS — Z923 Personal history of irradiation: Secondary | ICD-10-CM | POA: Insufficient documentation

## 2019-12-21 DIAGNOSIS — T451X5A Adverse effect of antineoplastic and immunosuppressive drugs, initial encounter: Secondary | ICD-10-CM | POA: Diagnosis not present

## 2019-12-21 DIAGNOSIS — C7931 Secondary malignant neoplasm of brain: Secondary | ICD-10-CM | POA: Insufficient documentation

## 2019-12-21 DIAGNOSIS — Z9221 Personal history of antineoplastic chemotherapy: Secondary | ICD-10-CM | POA: Insufficient documentation

## 2019-12-21 DIAGNOSIS — M810 Age-related osteoporosis without current pathological fracture: Secondary | ICD-10-CM | POA: Insufficient documentation

## 2019-12-21 DIAGNOSIS — Z95828 Presence of other vascular implants and grafts: Secondary | ICD-10-CM

## 2019-12-21 DIAGNOSIS — C3411 Malignant neoplasm of upper lobe, right bronchus or lung: Secondary | ICD-10-CM

## 2019-12-21 DIAGNOSIS — C3490 Malignant neoplasm of unspecified part of unspecified bronchus or lung: Secondary | ICD-10-CM

## 2019-12-21 DIAGNOSIS — I10 Essential (primary) hypertension: Secondary | ICD-10-CM | POA: Diagnosis not present

## 2019-12-21 DIAGNOSIS — Z5111 Encounter for antineoplastic chemotherapy: Secondary | ICD-10-CM | POA: Insufficient documentation

## 2019-12-21 DIAGNOSIS — C7949 Secondary malignant neoplasm of other parts of nervous system: Secondary | ICD-10-CM | POA: Diagnosis not present

## 2019-12-21 DIAGNOSIS — C3491 Malignant neoplasm of unspecified part of right bronchus or lung: Secondary | ICD-10-CM | POA: Diagnosis not present

## 2019-12-21 DIAGNOSIS — Z8673 Personal history of transient ischemic attack (TIA), and cerebral infarction without residual deficits: Secondary | ICD-10-CM | POA: Insufficient documentation

## 2019-12-21 DIAGNOSIS — Z79899 Other long term (current) drug therapy: Secondary | ICD-10-CM | POA: Diagnosis not present

## 2019-12-21 DIAGNOSIS — Z7952 Long term (current) use of systemic steroids: Secondary | ICD-10-CM | POA: Diagnosis not present

## 2019-12-21 DIAGNOSIS — D6481 Anemia due to antineoplastic chemotherapy: Secondary | ICD-10-CM

## 2019-12-21 DIAGNOSIS — M797 Fibromyalgia: Secondary | ICD-10-CM | POA: Insufficient documentation

## 2019-12-21 DIAGNOSIS — Z7982 Long term (current) use of aspirin: Secondary | ICD-10-CM | POA: Insufficient documentation

## 2019-12-21 LAB — CBC WITH DIFFERENTIAL (CANCER CENTER ONLY)
Abs Immature Granulocytes: 0.05 10*3/uL (ref 0.00–0.07)
Basophils Absolute: 0.1 10*3/uL (ref 0.0–0.1)
Basophils Relative: 1 %
Eosinophils Absolute: 0 10*3/uL (ref 0.0–0.5)
Eosinophils Relative: 0 %
HCT: 34.7 % — ABNORMAL LOW (ref 36.0–46.0)
Hemoglobin: 10.9 g/dL — ABNORMAL LOW (ref 12.0–15.0)
Immature Granulocytes: 1 %
Lymphocytes Relative: 23 %
Lymphs Abs: 2.4 10*3/uL (ref 0.7–4.0)
MCH: 30.7 pg (ref 26.0–34.0)
MCHC: 31.4 g/dL (ref 30.0–36.0)
MCV: 97.7 fL (ref 80.0–100.0)
Monocytes Absolute: 1.1 10*3/uL — ABNORMAL HIGH (ref 0.1–1.0)
Monocytes Relative: 11 %
Neutro Abs: 6.9 10*3/uL (ref 1.7–7.7)
Neutrophils Relative %: 64 %
Platelet Count: 342 10*3/uL (ref 150–400)
RBC: 3.55 MIL/uL — ABNORMAL LOW (ref 3.87–5.11)
RDW: 14.1 % (ref 11.5–15.5)
WBC Count: 10.5 10*3/uL (ref 4.0–10.5)
nRBC: 0 % (ref 0.0–0.2)

## 2019-12-21 LAB — CMP (CANCER CENTER ONLY)
ALT: 14 U/L (ref 0–44)
AST: 23 U/L (ref 15–41)
Albumin: 3.1 g/dL — ABNORMAL LOW (ref 3.5–5.0)
Alkaline Phosphatase: 74 U/L (ref 38–126)
Anion gap: 7 (ref 5–15)
BUN: 17 mg/dL (ref 8–23)
CO2: 25 mmol/L (ref 22–32)
Calcium: 9.5 mg/dL (ref 8.9–10.3)
Chloride: 107 mmol/L (ref 98–111)
Creatinine: 0.93 mg/dL (ref 0.44–1.00)
GFR, Estimated: >60 mL/min (ref >60)
Glucose, Bld: 145 mg/dL — ABNORMAL HIGH (ref 70–99)
Potassium: 4.2 mmol/L (ref 3.5–5.1)
Sodium: 139 mmol/L (ref 135–145)
Total Bilirubin: 0.5 mg/dL (ref 0.3–1.2)
Total Protein: 6.7 g/dL (ref 6.5–8.1)

## 2019-12-21 MED ORDER — SODIUM CHLORIDE 0.9% FLUSH
10.0000 mL | INTRAVENOUS | Status: DC | PRN
  Administered 2019-12-21: 12:00:00 10 mL
  Filled 2019-12-21: qty 10

## 2019-12-21 MED ORDER — ONDANSETRON HCL 8 MG PO TABS
ORAL_TABLET | ORAL | Status: AC
  Filled 2019-12-21: qty 1

## 2019-12-21 MED ORDER — CYANOCOBALAMIN 1000 MCG/ML IJ SOLN
INTRAMUSCULAR | Status: AC
  Filled 2019-12-21: qty 1

## 2019-12-21 MED ORDER — SODIUM CHLORIDE 0.9 % IV SOLN
Freq: Once | INTRAVENOUS | Status: AC
  Filled 2019-12-21: qty 250

## 2019-12-21 MED ORDER — CYANOCOBALAMIN 1000 MCG/ML IJ SOLN
1000.0000 ug | Freq: Once | INTRAMUSCULAR | Status: AC
  Administered 2019-12-21: 12:00:00 1000 ug via INTRAMUSCULAR

## 2019-12-21 MED ORDER — ONDANSETRON HCL 8 MG PO TABS
8.0000 mg | ORAL_TABLET | Freq: Once | ORAL | Status: AC
  Administered 2019-12-21: 11:00:00 8 mg via ORAL

## 2019-12-21 MED ORDER — HEPARIN SOD (PORK) LOCK FLUSH 100 UNIT/ML IV SOLN
500.0000 [IU] | Freq: Once | INTRAVENOUS | Status: AC | PRN
  Administered 2019-12-21: 12:00:00 500 [IU]
  Filled 2019-12-21: qty 5

## 2019-12-21 MED ORDER — SODIUM CHLORIDE 0.9 % IV SOLN
10.0000 mg | Freq: Once | INTRAVENOUS | Status: AC
  Administered 2019-12-21: 11:00:00 10 mg via INTRAVENOUS
  Filled 2019-12-21: qty 10

## 2019-12-21 MED ORDER — SODIUM CHLORIDE 0.9 % IV SOLN
500.0000 mg/m2 | Freq: Once | INTRAVENOUS | Status: AC
  Administered 2019-12-21: 12:00:00 800 mg via INTRAVENOUS
  Filled 2019-12-21: qty 20

## 2019-12-21 MED ORDER — SODIUM CHLORIDE 0.9% FLUSH
10.0000 mL | INTRAVENOUS | Status: DC | PRN
  Administered 2019-12-21: 09:00:00 10 mL via INTRAVENOUS
  Filled 2019-12-21: qty 10

## 2019-12-21 NOTE — Progress Notes (Signed)
Danielle [name]   [phone]  OFFICE PROGRESS NOTE  Danielle Sanders, MD [place] [address]  DIAGNOSIS: Metastatic non-small cell lung cancer initially diagnosed as locally advanced stage IIIB with a right Pancoast tumor involving the vertebral body as well as prominent canal invasion with spinal cord compression in October 2007. The patient also has metastatic disease to the brain in April 2008.  PRIOR THERAPY: 1. Status post concurrent chemoradiation with weekly carboplatin and paclitaxel, last dose was given November 18, 2005. 2. 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 3. Status post gamma knife radiotherapy to a solitary brain lesion located in the superior frontal area of the brain at [facility] in April of 2008. 4. Status post palliative radiotherapy to the lateral abdominal wall metastatic lesion under the care of Dr. Lisbeth [name], completed March of 2009. 5. Status post 6 cycles of systemic chemotherapy with carboplatin and Alimta. Last dose was given July 26, 2007 with disease stabilization. 6. Gamma knife stereotactic radiotherapy to 2 brain lesions one involving the right frontal dural based as well as right parietal lesion performed on 05/07/2012 under the care of Dr. Vallarie [name] at [facility].  CURRENT THERAPY: Maintenance systemic chemotherapy with Alimta 500 MG/M2 every 3 weeks, status post 213 cycles.  INTERVAL HISTORY: Danielle [name] 69 y.o. female returns to the clinic today for follow-up visit accompanied by her daughter. The patient is feeling fine today with no concerning complaints except for occasional nausea. She denied having any chest pain, shortness of breath, cough or hemoptysis. She denied having any fever or chills. She has no vomiting, diarrhea or constipation. She has no significant weight loss or night  sweats. She continues to tolerate her treatment with maintenance Alimta fairly well. She is here today for evaluation before starting cycle #214.  MEDICAL HISTORY: Past Medical History:  Diagnosis Date  . Anemia   . 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 Carcinomas, basal cell 01/22/2019   two places on face  . CVA (cerebral vascular accident) ([place]) 04/2014   pt states had 2 cva's within 2 wks  . DJD (degenerative joint disease), cervical   . Fibromyalgia   . H/O: pneumonia   . History of tobacco abuse quit 10/08   on nicotine patch  . Hypertension   . Hypokalemia   . Lung cancer ([place]) dx'd 09/2005   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 had chemo and radiation for lung ca  . Thrush 12/11/2010    ALLERGIES:  is allergic to contrast media [iodinated diagnostic agents], iohexol, sulfa drugs cross reactors, and co-trimoxazole injection [sulfamethoxazole-trimethoprim].  MEDICATIONS:  Current Outpatient Medications  Medication Sig Dispense Refill  . albuterol (VENTOLIN HFA) 108 (90 Base) MCG/ACT inhaler Inhale 2 puffs into the lungs every 6 (six) hours as needed for wheezing or shortness of breath. 8 g 2  . amLODipine (NORVASC) 10 MG tablet TAKE 1 TABLET BY MOUTH EVERY DAY 90 tablet 0  . aspirin 325 MG EC tablet TAKE 1 TABLET BY MOUTH EVERY DAY 90 tablet 0  . atorvastatin (LIPITOR) 40 MG tablet TAKE 1 TABLET BY MOUTH EVERY DAY AT 6 PM 90 tablet 1  . FeFum-FePoly-FA-B Cmp-C-Biot (INTEGRA PLUS) CAPS TAKE 1 CAPSULE BY MOUTH EVERY DAY IN THE MORNING 90 capsule 3  . fluticasone (FLONASE) 50 MCG/ACT nasal spray Place 1-2 sprays into both nostrils daily as needed.  16 g 5  . folic acid (FOLVITE) 1 MG tablet TAKE 1 TABLET BY MOUTH EVERY DAY 90 tablet 1  . lidocaine-prilocaine (EMLA) cream Apply to the Port-A-Cath site 30-60-minute before treatment. 30 g 0  . lisinopril (ZESTRIL) 20 MG tablet TAKE 2 TABLETS BY MOUTH EVERY DAY 180 tablet 3  . morphine (MS CONTIN) 60 MG 12 hr  tablet Take 1 tablet (60 mg total) by mouth 2 (two) times daily. 60 tablet 0  . morphine (MSIR) 30 MG tablet Take 1 tablet (30 mg total) by mouth every 4 (four) hours as needed (breakthrough pain). 60 tablet 0  . Olopatadine HCl 0.2 % SOLN INSTILL 1 DROP INTO AFFECTED EYE(S) EVERY DAY 2.5 mL 1  . ondansetron (ZOFRAN) 8 MG tablet Take 8 mg by mouth every 8 (eight) hours as needed for nausea or vomiting.    . prochlorperazine (COMPAZINE) 10 MG tablet Take 1 tablet (10 mg total) by mouth every 6 (six) hours as needed. 30 tablet 2  . triamcinolone (KENALOG) 0.025 % ointment Apply 1 application topically 2 (two) times daily. 30 g 0  . umeclidinium bromide (INCRUSE ELLIPTA) 62.5 MCG/INH AEPB Inhale 1 puff into the lungs daily. 1 each 11  . diphenhydrAMINE (BENADRYL) 25 MG tablet Take 1 tablet (25 mg total) by mouth as directed. 2 hours before CT scan. (Patient not taking: Reported on 12/21/2019) 5 tablet 0  . furosemide (LASIX) 20 MG tablet TAKE 1 TABLET BY MOUTH EVERY DAY AS NEEDED FOR SWELLING (Patient not taking: Reported on 12/21/2019) 20 tablet 1  . predniSONE (DELTASONE) 50 MG tablet 1 tablet by mouth 13 hours ,7 hours and 1 hour  before CT scan (Patient not taking: Reported on 12/21/2019) 3 tablet 4   No current facility-administered medications for this visit.    SURGICAL HISTORY:  Past Surgical History:  Procedure Laterality Date  . CERVICAL LAMINECTOMY  1995  . KNEE SURGERY  1990   Left x 2  . 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 LOOP RECORDER IMPLANT N/A 04/19/2014   Procedure: LOOP RECORDER IMPLANT;  Surgeon: Danielle Grayer, MD;  Location: [facility] CATH LAB;  Service: Cardiovascular;  Laterality: N/A;  . OTHER SURGICAL HISTORY  2008   Gamma knife surgery to remove brain met   . PORTACATH PLACEMENT  -Danielle [name]   TIP IN CAVOATRIAL JUNCTION  . TEE WITHOUT CARDIOVERSION N/A 04/19/2014   Procedure: TRANSESOPHAGEAL ECHOCARDIOGRAM (TEE);  Surgeon: Danielle Dresser, MD;   Location: [facility];  Service: Cardiovascular;  Laterality: N/A;    REVIEW OF SYSTEMS:  A comprehensive review of systems was negative except for: Gastrointestinal: positive for nausea   PHYSICAL EXAMINATION: General appearance: alert, cooperative and no distress Head: Normocephalic, without obvious abnormality, atraumatic Neck: no adenopathy, no JVD, supple, symmetrical, trachea midline and thyroid not enlarged, symmetric, no tenderness/mass/nodules Lymph nodes: Cervical, supraclavicular, and axillary nodes normal. Resp: clear to auscultation bilaterally Back: symmetric, no curvature. ROM normal. No CVA tenderness. Cardio: regular rate and rhythm, S1, S2 normal, no murmur, click, rub or gallop GI: soft, non-tender; bowel sounds normal; no masses,  no organomegaly Extremities: extremities normal, atraumatic, no cyanosis or edema  ECOG PERFORMANCE STATUS: 1 - Symptomatic but completely ambulatory  Blood pressure (!) 139/53, pulse 96, temperature 98.3 F (36.8 C), temperature source Tympanic, resp. rate 18, height 5' 2" (1.575 m), weight 146 lb 11.2 oz (66.5 kg), SpO2 91 %.  LABORATORY DATA: Lab Results  Component Value  Date   WBC 10.5 12/21/2019   HGB 10.9 (L) 12/21/2019   HCT 34.7 (L) 12/21/2019   MCV 97.7 12/21/2019   PLT 342 12/21/2019      Chemistry      Component Value Date/Time   NA 141 11/30/2019 1008   NA 142 12/24/2016 0816   K 4.2 11/30/2019 1008   K 4.4 12/24/2016 0816   CL 106 11/30/2019 1008   CL 106 06/30/2012 1004   CO2 25 11/30/2019 1008   CO2 28 12/24/2016 0816   BUN 16 11/30/2019 1008   BUN 15.8 12/24/2016 0816   CREATININE 0.86 11/30/2019 1008   CREATININE 1.1 12/24/2016 0816      Component Value Date/Time   CALCIUM 9.1 11/30/2019 1008   CALCIUM 9.6 12/24/2016 0816   ALKPHOS 71 11/30/2019 1008   ALKPHOS 80 12/24/2016 0816   AST 22 11/30/2019 1008   AST 20 12/24/2016 0816   ALT 13 11/30/2019 1008   ALT 11 12/24/2016 0816   BILITOT 0.4  11/30/2019 1008   BILITOT 0.34 12/24/2016 0816       RADIOGRAPHIC STUDIES: DG Bone Density  Result Date: 12/20/2019 EXAM: DUAL X-RAY ABSORPTIOMETRY (DXA) FOR BONE MINERAL DENSITY IMPRESSION: Referring Physician:  [name] BEDSOLE Your patient completed a BMD test using Lunar IDXA DXA system ( analysis version: 16 ) manufactured by [employer]. Technologist: AW PATIENT: Name: Felicidad, Sugarman Patient ID: [id number] Birth Date: 11-Aug-1950 Height: 62.0 in. Sex: Female Measured: 12/20/2019 Weight: 146.4 lbs. Indications: Caucasian, Estrogen Deficient, Lung Cancer, Metastatic disease, Postmenopausal Fractures: None Treatments: ASSESSMENT: The BMD measured at Femur Neck Left is 0.644 g/cm2 with a T-score of -2.8. This patient is considered osteoporotic according to [facility]) criteria. The scan quality is good. L-3 and L-4 were excluded due to degenerative changes. Site Region Measured Date Measured Age YA BMD Significant CHANGE T-score DualFemur Neck Left  [age]         -2.10    0.644 g/cm2 AP Spine  L1-L2      [age]         -1.4    1.022 g/cm2 DualFemur Total Mean [age]2019    [age]         -1.9    0.774 g/cm2 World Health Organization [facility]) criteria for post-menopausal, Caucasian Women: Normal       T-score at or above -1 SD Osteopenia   T-score between -1 and -2.5 SD Osteoporosis T-score at or below -2.5 SD RECOMMENDATION: 1. All patients should optimize calcium and vitamin D intake. 2. Consider FDA approved medical therapies in postmenopausal women and men aged 53 years and older, based on the following: a. A hip or vertebral (clinical or morphometric) fracture b. T- score < or = -2.5 at the femoral neck or spine after appropriate evaluation to exclude secondary causes c. Low bone mass (T-score between -1.0 and -2.5 at the femoral neck or spine) and a 10 year probability of a hip fracture > or = 3% or a 10 year probability of a major osteoporosis-related  fracture > or = 20% based on the [place]-adapted WHO algorithm d. Clinician judgment and/or patient preferences may indicate treatment for people with 10-year fracture probabilities above or below these levels FOLLOW-UP: Patients with diagnosis of osteoporosis or at high risk for fracture should have regular bone mineral density tests. For patients eligible for Medicare, routine testing is allowed once every 2 years. The testing frequency can be increased  to one year for patients who have rapidly progressing disease, those who are receiving or discontinuing medical therapy to restore bone mass, or have additional risk factors. I have reviewed this report and agree with the above findings. [facility] Radiology Electronically Signed   By: Lowella Grip III M.D.   On: 12/20/2019 13:35    ASSESSMENT AND PLAN: This is a very pleasant 69 years old white female with metastatic non-small cell lung cancer diagnosed in October 2007 status post concurrent chemoradiation followed by consolidation chemotherapy and she is currently on maintenance treatment with single agent Alimta status post 213 cycles. The patient continues to tolerate this treatment well with no concerning adverse effects. I recommended for her to proceed with cycle #214 today as planned. For pain management I will give her a refill of MS Contin and MSIR as well as Zofran for the nausea.  The patient voices understanding of current disease status and treatment options and is in agreement with the current care plan. All questions were answered. The patient knows to call the clinic with any problems, questions or concerns. We can certainly see the patient much sooner if necessary.  Disclaimer: This note was dictated with voice recognition software. Similar sounding words can inadvertently be transcribed and may not be corrected upon review.

## 2019-12-21 NOTE — Patient Instructions (Signed)
[facility] Discharge Instructions for Patients Receiving Chemotherapy  Today you received the following chemotherapy agents: pemetrexed.  To help prevent nausea and vomiting after your treatment, we encourage you to take your nausea medication as directed.   If you develop nausea and vomiting that is not controlled by your nausea medication, call the clinic.   BELOW ARE SYMPTOMS THAT SHOULD BE REPORTED IMMEDIATELY:  *FEVER GREATER THAN 100.5 F  *CHILLS WITH OR WITHOUT FEVER  NAUSEA AND VOMITING THAT IS NOT CONTROLLED WITH YOUR NAUSEA MEDICATION  *UNUSUAL SHORTNESS OF BREATH  *UNUSUAL BRUISING OR BLEEDING  TENDERNESS IN MOUTH AND THROAT WITH OR WITHOUT PRESENCE OF ULCERS  *URINARY PROBLEMS  *BOWEL PROBLEMS  UNUSUAL RASH Items with * indicate a potential emergency and should be followed up as soon as possible.  Feel free to call the clinic should you have any questions or concerns. The clinic phone number is [phone].  Please show the [place] at check-in to the Emergency Department and triage nurse.  Influenza Virus Vaccine injection What is this medicine? INFLUENZA VIRUS VACCINE (in floo EN zuh VAHY ruhs vak SEEN) helps to reduce the risk of getting influenza also known as the flu. The vaccine only helps protect you against some strains of the flu. This medicine may be used for other purposes; ask your health care provider or pharmacist if you have questions. COMMON BRAND NAME(S): Afluria, Afluria Quadrivalent, Agriflu, Alfuria, FLUAD, Fluarix, Fluarix Quadrivalent, Flublok, Flublok Quadrivalent, FLUCELVAX, FLUCELVAX Quadrivalent, Flulaval, Flulaval Quadrivalent, Fluvirin, Fluzone, Fluzone High-Dose, Fluzone Intradermal, Fluzone Quadrivalent What should I tell my health care provider before I take this medicine? They need to know if you have any of these conditions:  bleeding disorder like hemophilia  fever or infection  Guillain-Barre  syndrome or other neurological problems  immune system problems  infection with the human immunodeficiency virus (HIV) or AIDS  low blood platelet counts  multiple sclerosis  an unusual or allergic reaction to influenza virus vaccine, latex, other medicines, foods, dyes, or preservatives. Different brands of vaccines contain different allergens. Some may contain latex or eggs. Talk to your doctor about your allergies to make sure that you get the right vaccine.  pregnant or trying to get pregnant  breast-feeding How should I use this medicine? This vaccine is for injection into a muscle or under the skin. It is given by a health care professional. A copy of Vaccine Information Statements will be given before each vaccination. Read this sheet carefully each time. The sheet may change frequently. Talk to your healthcare provider to see which vaccines are right for you. Some vaccines should not be used in all age groups. Overdosage: If you think you have taken too much of this medicine contact a poison control center or emergency room at once. NOTE: This medicine is only for you. Do not share this medicine with others. What if I miss a dose? This does not apply. What may interact with this medicine?  chemotherapy or radiation therapy  medicines that lower your immune system like etanercept, anakinra, infliximab, and adalimumab  medicines that treat or prevent blood clots like warfarin  phenytoin  steroid medicines like prednisone or cortisone  theophylline  vaccines This list may not describe all possible interactions. Give your health care provider a list of all the medicines, herbs, non-prescription drugs, or dietary supplements you use. Also tell them if you smoke, drink alcohol, or use illegal drugs. Some items may interact with your medicine. What should  I watch for while using this medicine? Report any side effects that do not go away within 3 days to your doctor or health  care professional. Call your health care provider if any unusual symptoms occur within 6 weeks of receiving this vaccine. You may still catch the flu, but the illness is not usually as bad. You cannot get the flu from the vaccine. The vaccine will not protect against colds or other illnesses that may cause fever. The vaccine is needed every year. What side effects may I notice from receiving this medicine? Side effects that you should report to your doctor or health care professional as soon as possible:  allergic reactions like skin rash, itching or hives, swelling of the face, lips, or tongue Side effects that usually do not require medical attention (report to your doctor or health care professional if they continue or are bothersome):  fever  headache  muscle aches and pains  pain, tenderness, redness, or swelling at the injection site  tiredness This list may not describe all possible side effects. Call your doctor for medical advice about side effects. You may report side effects to FDA at 1-800-FDA-1088. Where should I keep my medicine? The vaccine will be given by a health care professional in a clinic, pharmacy, doctor's office, or other health care setting. You will not be given vaccine doses to store at home. NOTE: This sheet is a summary. It may not cover all possible information. If you have questions about this medicine, talk to your doctor, pharmacist, or health care provider.  2020 Elsevier/Gold Standard (2017-11-18 08:45:43)

## 2019-12-22 ENCOUNTER — Other Ambulatory Visit: Payer: Self-pay | Admitting: Internal Medicine

## 2019-12-22 ENCOUNTER — Other Ambulatory Visit: Payer: Self-pay | Admitting: Family Medicine

## 2019-12-22 ENCOUNTER — Telehealth: Payer: Self-pay

## 2019-12-22 DIAGNOSIS — D649 Anemia, unspecified: Secondary | ICD-10-CM

## 2019-12-22 DIAGNOSIS — C3490 Malignant neoplasm of unspecified part of unspecified bronchus or lung: Secondary | ICD-10-CM

## 2019-12-22 DIAGNOSIS — C3410 Malignant neoplasm of upper lobe, unspecified bronchus or lung: Secondary | ICD-10-CM

## 2019-12-22 MED ORDER — MORPHINE SULFATE ER 60 MG PO TBCR: 60.0000 mg | EXTENDED_RELEASE_TABLET | Freq: Two times a day (BID) | ORAL | 0 refills | Status: DC

## 2019-12-22 MED ORDER — ONDANSETRON HCL 8 MG PO TABS: 8.0000 mg | ORAL_TABLET | Freq: Three times a day (TID) | ORAL | 0 refills | Status: DC | PRN

## 2019-12-22 MED ORDER — MORPHINE SULFATE 30 MG PO TABS: 30.0000 mg | ORAL_TABLET | ORAL | 0 refills | Status: DC | PRN

## 2019-12-22 NOTE — Telephone Encounter (Signed)
Pt called requesting refill of both of her morphine rx's. Discussed with Dr. Julien Nordmann who advised he will send them now. Pt has been advised and expressed understanding of the information.

## 2020-01-06 ENCOUNTER — Telehealth: Payer: Self-pay

## 2020-01-06 NOTE — Telephone Encounter (Signed)
Pt said she has not heard from recent bone density and cannot get into my chart; pt notified of bone density as instructed and voiced understanding and pt scheduled in office appt to talk about med for osteoporosis 01/18/19 at 10:40.

## 2020-01-09 NOTE — Progress Notes (Signed)
[place] OFFICE PROGRESS NOTE  [name], MD [place] [address]  DIAGNOSIS: Metastatic non-small cell lung cancer initially diagnosed as locally advanced stage IIIB with a right Pancoast tumor involving the vertebral body as well as prominent canal invasion with spinal cord compression in October 2007. The patient also has metastatic disease to the brain in April 2008.  PRIOR THERAPY: 1. Status post concurrent chemoradiation with weekly carboplatin and paclitaxel, last dose was given November 18, 2005. 2. 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 3. Status post gamma knife radiotherapy to a solitary brain lesion located in the superior frontal area of the brain at [facility] in April of 2008. 4. Status post palliative radiotherapy to the lateral abdominal wall metastatic lesion under the care of Dr. Lisbeth Renshaw, completed March of 2009. 5. Status post 6 cycles of systemic chemotherapy with carboplatin and Alimta. Last dose was given July 26, 2007 with disease stabilization. 6. Gamma knife stereotactic radiotherapy to 2 brain lesions one involving the right frontal dural based as well as right parietal lesion performed on 05/07/2012 under the care of Dr. Vallarie Mare at [facility].   CURRENT THERAPY: Maintenance systemic chemotherapy with Alimta 500 MG/M2 every 3 weeks, status post 214cycles.  INTERVAL HISTORY: Danielle [name] 70 y.o. female returns to the clinic for a follow up visit accompanied by her daughter.The patient is feeling well today without any concerning complaints. She is tolerating her treatment with maintenance Alimta well without any adverse side effects except for occasional nausea/vomiting. She had nausea/vomiting for ~4 days after the last treatment. She takes zofran for nausea.She denies any feveror chills. She reportedly has baseline night  sweats for several years, which are unchanged.She denies any hemoptysis, cough,chest pain. She reports her baseline shortness of breath with exertion.She denies any diarrhea or constipation. She denies any headaches or visual changes.She has been using compression stockings for bilateral lower extremity swelling. She is here today for evaluationbeforestarting cycle #215.   MEDICAL HISTORY: Past Medical History:  Diagnosis Date  . Anemia   . 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 Carcinomas, basal cell 01/22/2019   two places on face  . CVA (cerebral vascular accident) ([place]) 04/2014   pt states had 2 cva's within 2 wks  . DJD (degenerative joint disease), cervical   . Fibromyalgia   . H/O: pneumonia   . History of tobacco abuse quit 10/08   on nicotine patch  . Hypertension   . Hypokalemia   . Lung cancer ([place]) dx'd 09/2005   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 had chemo and radiation for lung ca  . Thrush 12/11/2010    ALLERGIES:  is allergic to contrast media [iodinated diagnostic agents], iohexol, sulfa drugs cross reactors, and co-trimoxazole injection [sulfamethoxazole-trimethoprim].  MEDICATIONS:  Current Outpatient Medications  Medication Sig Dispense Refill  . albuterol (VENTOLIN HFA) 108 (90 Base) MCG/ACT inhaler Inhale 2 puffs into the lungs every 6 (six) hours as needed for wheezing or shortness of breath. 8 g 2  . amLODipine (NORVASC) 10 MG tablet TAKE 1 TABLET BY MOUTH EVERY DAY 90 tablet 2  . aspirin 325 MG EC tablet TAKE 1 TABLET BY MOUTH EVERY DAY 90 tablet 0  . atorvastatin (LIPITOR) 40 MG tablet TAKE 1 TABLET BY MOUTH EVERY DAY AT 6 PM 90 tablet 1  . diphenhydrAMINE (BENADRYL) 25 MG tablet Take 1 tablet (25 mg total) by  mouth as directed. 2 hours before CT scan. 5 tablet 0  . FeFum-FePoly-FA-B Cmp-C-Biot (INTEGRA PLUS) CAPS TAKE 1 CAPSULE BY MOUTH EVERY DAY IN THE MORNING 90 capsule 3  . fluticasone (FLONASE) 50 MCG/ACT nasal spray  Place 1-2 sprays into both nostrils daily as needed. 16 g 5  . folic acid (FOLVITE) 1 MG tablet TAKE 1 TABLET BY MOUTH EVERY DAY 90 tablet 1  . furosemide (LASIX) 20 MG tablet TAKE 1 TABLET BY MOUTH EVERY DAY AS NEEDED FOR SWELLING 20 tablet 1  . lidocaine-prilocaine (EMLA) cream Apply to the Port-A-Cath site 30-60-minute before treatment. 30 g 0  . lisinopril (ZESTRIL) 20 MG tablet TAKE 2 TABLETS BY MOUTH EVERY DAY 180 tablet 2  . morphine (MS CONTIN) 60 MG 12 hr tablet Take 1 tablet (60 mg total) by mouth 2 (two) times daily. 60 tablet 0  . morphine (MSIR) 30 MG tablet Take 1 tablet (30 mg total) by mouth every 4 (four) hours as needed (breakthrough pain). 60 tablet 0  . Olopatadine HCl 0.2 % SOLN INSTILL 1 DROP INTO AFFECTED EYE(S) EVERY DAY 2.5 mL 1  . ondansetron (ZOFRAN) 8 MG tablet Take 1 tablet (8 mg total) by mouth every 8 (eight) hours as needed for nausea or vomiting. 20 tablet 0  . predniSONE (DELTASONE) 50 MG tablet 1 tablet by mouth 13 hours ,7 hours and 1 hour  before CT scan (Patient taking differently: 1 tablet by mouth 13 hours ,7 hours and 1 hour  before CT scan) 3 tablet 4  . prochlorperazine (COMPAZINE) 10 MG tablet Take 1 tablet (10 mg total) by mouth every 6 (six) hours as needed. 30 tablet 2  . triamcinolone (KENALOG) 0.025 % ointment Apply 1 application topically 2 (two) times daily. 30 g 0  . umeclidinium bromide (INCRUSE ELLIPTA) 62.5 MCG/INH AEPB Inhale 1 puff into the lungs daily. 1 each 11   No current facility-administered medications for this visit.    SURGICAL HISTORY:  Past Surgical History:  Procedure Laterality Date  . CERVICAL LAMINECTOMY  1995  . KNEE SURGERY  1990   Left x 2  . 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 LOOP RECORDER IMPLANT N/A 04/19/2014   Procedure: LOOP RECORDER IMPLANT;  Surgeon: Thompson Grayer, MD;  Location: [facility] CATH LAB;  Service: Cardiovascular;  Laterality: N/A;  . OTHER SURGICAL HISTORY  2008   Gamma  knife surgery to remove brain met   . PORTACATH PLACEMENT  -[name]   TIP IN CAVOATRIAL JUNCTION  . TEE WITHOUT CARDIOVERSION N/A 04/19/2014   Procedure: TRANSESOPHAGEAL ECHOCARDIOGRAM (TEE);  Surgeon: Larey Dresser, MD;  Location: [facility];  Service: Cardiovascular;  Laterality: N/A;    REVIEW OF SYSTEMS:   Review of Systems Constitutional: Negative for appetite change, chills, fatigue, fever and unexpected weight change.  HENT:Negative for mouth sores, nosebleeds, sore throat and trouble swallowing.  Eyes: Negative for eye problems and icterus.  Respiratory:Positive for baseline shortness of breath with exertion.Negative for cough, hemoptysis, and wheezing.  Cardiovascular:Positive for bilateral lower extremity swelling.Negative for chest pain. Gastrointestinal:Positive for occasional nausea and vomiting.Negative for abdominal pain, constipation, and diarrhea. Genitourinary: Negative for bladder incontinence, difficulty urinating, dysuria, frequency and hematuria.  Musculoskeletal: Negative for back pain, gait problem, neck pain and neck stiffness.  Skin:Positive for several scars on her skin.Negative for itching and rash.  Neurological: Negative for dizziness, extremity weakness, gait problem, headaches, light-headedness and seizures.  Hematological: Negative for adenopathy.  Does not bruise/bleed easily.  Psychiatric/Behavioral: Negative for confusion, depression and sleep disturbance. The patient is not nervous/anxious.   PHYSICAL EXAMINATION:  Blood pressure (!) 167/72, pulse (!) 104, temperature 97.7 F (36.5 C), resp. rate 19, height _0  (1.575 m), weight 145 lb 11.2 oz (66.1 kg), SpO2 99 %.  ECOG PERFORMANCE STATUS: 1 - Symptomatic but completely ambulatory  Physical Exam  Constitutional: Oriented to person, place, and time and well-developed, well-nourished, and in no distress.  HENT:  Head: Normocephalic and atraumatic.  Mouth/Throat: Oropharynx is  clear and moist. No oropharyngeal exudate.  Eyes: Conjunctivae are normal. Right eye exhibits no discharge. Left eye exhibits no discharge. No scleral icterus.  Neck: Normal range of motion. Neck supple.  Cardiovascular: Normal rate, regular rhythm, normal heart sounds and intact distal pulses.  Pulmonary/Chest: Effort normal and breath soundsquiet in all lung fields.No respiratory distress. No wheezes. No rales.  Abdominal: Soft. Bowel sounds are normal. Exhibits no distension and no mass. There is no tenderness.  Musculoskeletal: Positive for bilateral lower extremity edema.Normal range of motion.  Lymphadenopathy:  No cervical adenopathy.  Neurological: Alert and oriented to person, place, and time. Exhibits normal muscle tone. Gait normal. Coordination normal.  Skin: Skin is warm and dry. No rash noted. Not diaphoretic. No erythema. No pallor.  Psychiatric: Mood, memory and judgment normal.  Vitals reviewed.  LABORATORY DATA: Lab Results  Component Value Date   WBC 8.2 01/11/2020   HGB 11.5 (L) 01/11/2020   HCT 36.1 01/11/2020   MCV 96.0 01/11/2020   PLT 332 01/11/2020      Chemistry      Component Value Date/Time   NA 140 01/11/2020 0949   NA 142 12/24/2016 0816   K 3.8 01/11/2020 0949   K 4.4 12/24/2016 0816   CL 108 01/11/2020 0949   CL 106 06/30/2012 1004   CO2 24 01/11/2020 0949   CO2 28 12/24/2016 0816   BUN 10 01/11/2020 0949   BUN 15.8 12/24/2016 0816   CREATININE 0.79 01/11/2020 0949   CREATININE 0.93 12/21/2019 0923   CREATININE 1.1 12/24/2016 0816      Component Value Date/Time   CALCIUM 9.4 01/11/2020 0949   CALCIUM 9.6 12/24/2016 0816   ALKPHOS 77 01/11/2020 0949   ALKPHOS 80 12/24/2016 0816   AST 22 01/11/2020 0949   AST 23 12/21/2019 0923   AST 20 12/24/2016 0816   ALT 13 01/11/2020 0949   ALT 14 12/21/2019 0923   ALT 11 12/24/2016 0816   BILITOT 0.3 01/11/2020 0949   BILITOT 0.5 12/21/2019 0923   BILITOT 0.34 12/24/2016 0816        RADIOGRAPHIC STUDIES:  DG Bone Density  Result Date: 12/20/2019 EXAM: DUAL X-RAY ABSORPTIOMETRY (DXA) FOR BONE MINERAL DENSITY IMPRESSION: Referring Physician:  [name] BEDSOLE Your patient completed a BMD test using Lunar IDXA DXA system ( analysis version: 16 ) manufactured by [employer]. Technologist: AW PATIENT: Name: Tyliyah, Mcmeekin Patient ID: [id number] Birth Date: 1950/05/07 Height: 62.0 in. Sex: Female Measured: 12/20/2019 Weight: 146.4 lbs. Indications: Caucasian, Estrogen Deficient, Lung Cancer, Metastatic disease, Postmenopausal Fractures: None Treatments: ASSESSMENT: The BMD measured at Femur Neck Left is 0.644 g/cm2 with a T-score of -2.8. This patient is considered osteoporotic according to [facility]) criteria. The scan quality is good. L-3 and L-4 were excluded due to degenerative changes. Site Region Measured Date Measured Age YA BMD Significant CHANGE T-score DualFemur Neck Left  [age]    67.4         -  2.8    0.644 g/cm2 AP Spine  L1-L2      [age]2019    67.6         -1.2    1.022 g/cm2 DualFemur Total Mean [age]/2019    67.4         -1.9    0.774 g/cm2 World Health Organization [facility]) criteria for post-menopausal, Caucasian Women: Normal       T-score at or above -1 SD Osteopenia   T-score between -1 and -2.5 SD Osteoporosis T-score at or below -2.5 SD RECOMMENDATION: 1. All patients should optimize calcium and vitamin D intake. 2. Consider FDA approved medical therapies in postmenopausal women and men aged 61 years and older, based on the following: a. A hip or vertebral (clinical or morphometric) fracture b. T- score < or = -2.5 at the femoral neck or spine after appropriate evaluation to exclude secondary causes c. Low bone mass (T-score between -1.0 and -2.5 at the femoral neck or spine) and a 10 year probability of a hip fracture > or = 3% or a 10 year probability of a major osteoporosis-related fracture > or = 20% based on the [place]-adapted WHO  algorithm d. Clinician judgment and/or patient preferences may indicate treatment for people with 10-year fracture probabilities above or below these levels FOLLOW-UP: Patients with diagnosis of osteoporosis or at high risk for fracture should have regular bone mineral density tests. For patients eligible for Medicare, routine testing is allowed once every 2 years. The testing frequency can be increased to one year for patients who have rapidly progressing disease, those who are receiving or discontinuing medical therapy to restore bone mass, or have additional risk factors. I have reviewed this report and agree with the above findings. [facility] Electronically Signed   By: Lowella Grip III M.D.   On: 12/20/2019 13:35     ASSESSMENT/PLAN:  This isa very pleasant 70 year old Caucasian female with metastatic non-small cell lung cancer who was initially diagnosed in October 2007. She is status post current chemoradiation followed by consolidation chemotherapy.  She is currently undergoing weeklymaintainancechemotherapy with single agent Alimta 500 mg/m. She is status post214cycles.She is tolerating treatment well without any concerning complaintsexcept mild nausea/vomiting.  Labs were reviewed. Recommend that she proceed with cycle #215today as scheduled.  We will see her back for a follow up visit 3 weeks for evaluation before starting cycle #216.  The patient was advised to call immediately if she has any concerning symptoms in the interval. The patient voices understanding of current disease status and treatment options and is in agreement with the current care plan. All questions were answered. The patient knows to call the clinic with any problems, questions or concerns. We can certainly see the patient much sooner if necessary  No orders of the defined types were placed in this encounter.    Olimpia Tinch L Cavan Bearden, PA-C 01/11/20

## 2020-01-11 ENCOUNTER — Inpatient Hospital Stay (HOSPITAL_BASED_OUTPATIENT_CLINIC_OR_DEPARTMENT_OTHER): Payer: Medicare (Managed Care) | Admitting: Internal Medicine

## 2020-01-11 ENCOUNTER — Inpatient Hospital Stay: Payer: Medicare (Managed Care)

## 2020-01-11 ENCOUNTER — Inpatient Hospital Stay: Payer: Medicare (Managed Care) | Attending: Physician Assistant | Admitting: Physician Assistant

## 2020-01-11 ENCOUNTER — Other Ambulatory Visit: Payer: Self-pay

## 2020-01-11 ENCOUNTER — Encounter: Payer: Self-pay | Admitting: Physician Assistant

## 2020-01-11 VITALS — BP 167/72 | HR 104 | Temp 97.7°F | Resp 19 | Ht 62.0 in | Wt 145.7 lb

## 2020-01-11 DIAGNOSIS — Z923 Personal history of irradiation: Secondary | ICD-10-CM | POA: Diagnosis not present

## 2020-01-11 DIAGNOSIS — C3411 Malignant neoplasm of upper lobe, right bronchus or lung: Secondary | ICD-10-CM | POA: Diagnosis not present

## 2020-01-11 DIAGNOSIS — Z7982 Long term (current) use of aspirin: Secondary | ICD-10-CM | POA: Diagnosis not present

## 2020-01-11 DIAGNOSIS — C3491 Malignant neoplasm of unspecified part of right bronchus or lung: Secondary | ICD-10-CM | POA: Diagnosis present

## 2020-01-11 DIAGNOSIS — M797 Fibromyalgia: Secondary | ICD-10-CM | POA: Diagnosis not present

## 2020-01-11 DIAGNOSIS — R600 Localized edema: Secondary | ICD-10-CM | POA: Insufficient documentation

## 2020-01-11 DIAGNOSIS — Z8673 Personal history of transient ischemic attack (TIA), and cerebral infarction without residual deficits: Secondary | ICD-10-CM | POA: Diagnosis not present

## 2020-01-11 DIAGNOSIS — I10 Essential (primary) hypertension: Secondary | ICD-10-CM | POA: Diagnosis not present

## 2020-01-11 DIAGNOSIS — Z5111 Encounter for antineoplastic chemotherapy: Secondary | ICD-10-CM | POA: Insufficient documentation

## 2020-01-11 DIAGNOSIS — R112 Nausea with vomiting, unspecified: Secondary | ICD-10-CM | POA: Diagnosis not present

## 2020-01-11 DIAGNOSIS — T451X5A Adverse effect of antineoplastic and immunosuppressive drugs, initial encounter: Secondary | ICD-10-CM | POA: Diagnosis not present

## 2020-01-11 DIAGNOSIS — D6481 Anemia due to antineoplastic chemotherapy: Secondary | ICD-10-CM | POA: Insufficient documentation

## 2020-01-11 DIAGNOSIS — M479 Spondylosis, unspecified: Secondary | ICD-10-CM | POA: Diagnosis not present

## 2020-01-11 DIAGNOSIS — C3490 Malignant neoplasm of unspecified part of unspecified bronchus or lung: Secondary | ICD-10-CM

## 2020-01-11 DIAGNOSIS — Z95828 Presence of other vascular implants and grafts: Secondary | ICD-10-CM

## 2020-01-11 DIAGNOSIS — Z79899 Other long term (current) drug therapy: Secondary | ICD-10-CM | POA: Insufficient documentation

## 2020-01-11 DIAGNOSIS — C7931 Secondary malignant neoplasm of brain: Secondary | ICD-10-CM | POA: Diagnosis present

## 2020-01-11 LAB — CBC WITH DIFFERENTIAL/PLATELET
Abs Immature Granulocytes: 0.04 10*3/uL (ref 0.00–0.07)
Basophils Absolute: 0 10*3/uL (ref 0.0–0.1)
Basophils Relative: 0 %
Eosinophils Absolute: 0 10*3/uL (ref 0.0–0.5)
Eosinophils Relative: 0 %
HCT: 36.1 % (ref 36.0–46.0)
Hemoglobin: 11.5 g/dL — ABNORMAL LOW (ref 12.0–15.0)
Immature Granulocytes: 1 %
Lymphocytes Relative: 17 %
Lymphs Abs: 1.4 10*3/uL (ref 0.7–4.0)
MCH: 30.6 pg (ref 26.0–34.0)
MCHC: 31.9 g/dL (ref 30.0–36.0)
MCV: 96 fL (ref 80.0–100.0)
Monocytes Absolute: 0.8 10*3/uL (ref 0.1–1.0)
Monocytes Relative: 10 %
Neutro Abs: 5.9 10*3/uL (ref 1.7–7.7)
Neutrophils Relative %: 72 %
Platelets: 332 10*3/uL (ref 150–400)
RBC: 3.76 MIL/uL — ABNORMAL LOW (ref 3.87–5.11)
RDW: 13.6 % (ref 11.5–15.5)
WBC: 8.2 10*3/uL (ref 4.0–10.5)
nRBC: 0 % (ref 0.0–0.2)

## 2020-01-11 LAB — COMPREHENSIVE METABOLIC PANEL
ALT: 13 U/L (ref 0–44)
AST: 22 U/L (ref 15–41)
Albumin: 3.2 g/dL — ABNORMAL LOW (ref 3.5–5.0)
Alkaline Phosphatase: 77 U/L (ref 38–126)
Anion gap: 8 (ref 5–15)
BUN: 10 mg/dL (ref 8–23)
CO2: 24 mmol/L (ref 22–32)
Calcium: 9.4 mg/dL (ref 8.9–10.3)
Chloride: 108 mmol/L (ref 98–111)
Creatinine, Ser: 0.79 mg/dL (ref 0.44–1.00)
GFR, Estimated: >60 mL/min (ref >60)
Glucose, Bld: 179 mg/dL — ABNORMAL HIGH (ref 70–99)
Potassium: 3.8 mmol/L (ref 3.5–5.1)
Sodium: 140 mmol/L (ref 135–145)
Total Bilirubin: 0.3 mg/dL (ref 0.3–1.2)
Total Protein: 7 g/dL (ref 6.5–8.1)

## 2020-01-11 MED ORDER — ONDANSETRON HCL 8 MG PO TABS
8.0000 mg | ORAL_TABLET | Freq: Once | ORAL | Status: AC
Start: 2020-01-11 — End: 2020-01-11
  Administered 2020-01-11: 8 mg via ORAL

## 2020-01-11 MED ORDER — SODIUM CHLORIDE 0.9 % IV SOLN
Freq: Once | INTRAVENOUS | Status: AC
  Filled 2020-01-11: qty 250

## 2020-01-11 MED ORDER — HEPARIN SOD (PORK) LOCK FLUSH 100 UNIT/ML IV SOLN
500.0000 [IU] | Freq: Once | INTRAVENOUS | Status: AC | PRN
  Administered 2020-01-11: 500 [IU]
  Filled 2020-01-11: qty 5

## 2020-01-11 MED ORDER — SODIUM CHLORIDE 0.9% FLUSH
10.0000 mL | INTRAVENOUS | Status: DC | PRN
  Administered 2020-01-11: 10 mL via INTRAVENOUS
  Filled 2020-01-11: qty 10

## 2020-01-11 MED ORDER — SODIUM CHLORIDE 0.9% FLUSH
10.0000 mL | INTRAVENOUS | Status: DC | PRN
  Administered 2020-01-11: 10 mL
  Filled 2020-01-11: qty 10

## 2020-01-11 MED ORDER — SODIUM CHLORIDE 0.9 % IV SOLN
500.0000 mg/m2 | Freq: Once | INTRAVENOUS | Status: AC
  Administered 2020-01-11: 800 mg via INTRAVENOUS
  Filled 2020-01-11: qty 20

## 2020-01-11 MED ORDER — ONDANSETRON HCL 8 MG PO TABS
ORAL_TABLET | ORAL | Status: AC
  Filled 2020-01-11: qty 1

## 2020-01-11 MED ORDER — SODIUM CHLORIDE 0.9 % IV SOLN
10.0000 mg | Freq: Once | INTRAVENOUS | Status: AC
  Administered 2020-01-11: 10 mg via INTRAVENOUS
  Filled 2020-01-11: qty 10

## 2020-01-11 NOTE — Progress Notes (Signed)
No critical labs need to be addressed urgently. We will discuss labs in detail at upcoming office visit.   

## 2020-01-11 NOTE — Patient Instructions (Signed)
[facility] Discharge Instructions for Patients Receiving Chemotherapy  Today you received the following chemotherapy agents:  Alimta  To help prevent nausea and vomiting after your treatment, we encourage you to take your nausea medication as needed. If you develop nausea and vomiting that is not controlled by your nausea medication, call the clinic.   BELOW ARE SYMPTOMS THAT SHOULD BE REPORTED IMMEDIATELY:  *FEVER GREATER THAN 100.5 F  *CHILLS WITH OR WITHOUT FEVER  NAUSEA AND VOMITING THAT IS NOT CONTROLLED WITH YOUR NAUSEA MEDICATION  *UNUSUAL SHORTNESS OF BREATH  *UNUSUAL BRUISING OR BLEEDING  TENDERNESS IN MOUTH AND THROAT WITH OR WITHOUT PRESENCE OF ULCERS  *URINARY PROBLEMS  *BOWEL PROBLEMS  UNUSUAL RASH Items with * indicate a potential emergency and should be followed up as soon as possible.  Feel free to call the clinic should you have any questions or concerns. The clinic phone number is [phone].  Please show the [place] at check-in to the Emergency Department and triage nurse.

## 2020-01-18 ENCOUNTER — Other Ambulatory Visit: Payer: Self-pay

## 2020-01-18 ENCOUNTER — Encounter: Payer: Self-pay | Admitting: Internal Medicine

## 2020-01-18 ENCOUNTER — Ambulatory Visit (INDEPENDENT_AMBULATORY_CARE_PROVIDER_SITE_OTHER): Payer: Medicare (Managed Care) | Admitting: Internal Medicine

## 2020-01-18 ENCOUNTER — Ambulatory Visit: Payer: Medicaid Other | Admitting: Family Medicine

## 2020-01-18 DIAGNOSIS — M810 Age-related osteoporosis without current pathological fracture: Secondary | ICD-10-CM

## 2020-01-18 MED ORDER — ALENDRONATE SODIUM 70 MG PO TABS: 70.0000 mg | ORAL_TABLET | ORAL | 11 refills | Status: DC

## 2020-01-18 NOTE — Assessment & Plan Note (Signed)
Discussed treatment options using oral, injectable and IV medications Discussed risk of jaw necrosis with biphosphonate treatment She will start calcium 800 mg OTC daily in addition to her vitamin D 2000 units Encouraged daily weightbearing exercise Will start Fosamax 70 mg once weekly Plan to repeat bone density in 2 years

## 2020-01-18 NOTE — Patient Instructions (Signed)
Osteoporosis  Osteoporosis is when the bones get thin and weak. This can cause your bones to break (fracture) more easily. What are the causes? The exact cause of this condition is not known. What increases the risk?  Having family members with this condition.  Not eating enough healthy foods.  Taking certain medicines.  Being female.  Being age 68 or older.  Smoking or using other products that contain nicotine or tobacco, such as e-cigarettes or chewing tobacco.  Not exercising.  Being of European or Asian ancestry.  Having a small body frame. What are the signs or symptoms? A broken bone might be the first sign, especially if the break results from a fall or injury that usually would not cause a bone to break. Other signs and symptoms include:  Pain in the neck or low back.  Being hunched over (stooped posture).  Getting shorter. How is this treated?  Eating more foods with more calcium and vitamin D in them.  Doing exercises.  Stopping tobacco use.  Limiting how much alcohol you drink.  Taking medicines to slow bone loss or help make the bones stronger.  Taking supplements of calcium and vitamin D every day.  Taking medicines to replace chemicals in the body (hormone replacement medicines).  Monitoring your levels of calcium and vitamin D. The goal of treatment is to strengthen your bones and lower your risk for a bone break. Follow these instructions at home: Eating and drinking Eat plenty of calcium and vitamin D. These nutrients are good for your bones. Good sources of calcium and vitamin D include:  Some fish, such as salmon and tuna.  Foods that have calcium and vitamin D added to them (fortified foods), such as some breakfast cereals.  Egg yolks.  Cheese.  Liver.   Activity Do exercises as told by your doctor. Ask your doctor what exercises are safe for you. You should do:  Exercises that make your muscles work to hold your body weight up  (weight-bearing exercises). These include tai chi, yoga, and walking.  Exercises to make your muscles stronger. One example is lifting weights. Lifestyle  Do not drink alcohol if: ? Your doctor tells you not to drink. ? You are pregnant, may be pregnant, or are planning to become pregnant.  If you drink alcohol: ? Limit how much you use to:  0-1 drink a day for women.  0-2 drinks a day for men.  Know how much alcohol is in your drink. In the [place] one drink equals one 12 oz bottle of beer (355 mL), one 5 oz glass of wine (148 mL), or one 1 oz glass of hard liquor (44 mL).  Do not smoke or use any products that contain nicotine or tobacco. If you need help quitting, ask your doctor. Preventing falls  Use tools to help you move around (mobility aids) as needed. These include canes, walkers, scooters, and crutches.  Keep rooms well-lit.  Put away things on the floor that could make you trip. These include cords and rugs.  Install safety rails on stairs. Install grab bars in bathrooms.  Use rubber mats in slippery areas, like bathrooms.  Wear shoes that: ? Fit you well. ? Support your feet. ? Have closed toes. ? Have rubber soles or low heels.  Tell your doctor about all of the medicines you are taking. Some medicines can make you more likely to fall. General instructions  Take over-the-counter and prescription medicines only as told by your doctor.    Keep all follow-up visits. Contact a doctor if:  You have not been tested (screened) for osteoporosis and you are: ? A woman who is age 65 or older. ? A man who is age 70 or older. Get help right away if:  You fall.  You get hurt. Summary  Osteoporosis happens when your bones get thin and weak.  Weak bones can break (fracture) more easily.  Eat plenty of calcium and vitamin D. These are good for your bones.  Tell your doctor about all of the medicines that you take. This information is not intended to replace  advice given to you by your health care provider. Make sure you discuss any questions you have with your health care provider. Document Revised: 06/10/2019 Document Reviewed: 06/10/2019 Elsevier Patient Education  2021 [employer]  

## 2020-01-18 NOTE — Progress Notes (Signed)
Subjective:    Patient ID: Danielle [name], female    DOB: April 08, 1950, 70 y.o.   MRN: [id number]  HPI  Patient presents the clinic today to discuss treatment options for osteoporosis.  She had a bone density scan 12/20/2019 with a T score of -2.8.  She has started vitamin D 2000 units OTC.  She is not currently taking any Calcium.  She does not get any routine exercise in.  She has never been treated for osteoporosis in the past.  Review of Systems      Past Medical History:  Diagnosis Date  . Anemia   . 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 Carcinomas, basal cell 01/22/2019   two places on face  . CVA (cerebral vascular accident) ([place]) 04/2014   pt states had 2 cva's within 2 wks  . DJD (degenerative joint disease), cervical   . Fibromyalgia   . H/O: pneumonia   . History of tobacco abuse quit 10/08   on nicotine patch  . Hypertension   . Hypokalemia   . Lung cancer ([place]) dx'd 09/2005   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 had chemo and radiation for lung ca  . Thrush 12/11/2010    Current Outpatient Medications  Medication Sig Dispense Refill  . albuterol (VENTOLIN HFA) 108 (90 Base) MCG/ACT inhaler Inhale 2 puffs into the lungs every 6 (six) hours as needed for wheezing or shortness of breath. 8 g 2  . alendronate (FOSAMAX) 70 MG tablet Take 1 tablet (70 mg total) by mouth every 7 (seven) days. Take with a full glass of water on an empty stomach. 4 tablet 11  . amLODipine (NORVASC) 10 MG tablet TAKE 1 TABLET BY MOUTH EVERY DAY 90 tablet 2  . aspirin 325 MG EC tablet TAKE 1 TABLET BY MOUTH EVERY DAY 90 tablet 0  . atorvastatin (LIPITOR) 40 MG tablet TAKE 1 TABLET BY MOUTH EVERY DAY AT 6 PM 90 tablet 1  . Cholecalciferol (VITAMIN D3) 50 MCG (2000 UT) TABS Take by mouth.    . diphenhydrAMINE (BENADRYL) 25 MG tablet Take 1 tablet (25 mg total) by mouth as directed. 2 hours before CT scan. 5 tablet 0  . FeFum-FePoly-FA-B Cmp-C-Biot (INTEGRA PLUS) CAPS  TAKE 1 CAPSULE BY MOUTH EVERY DAY IN THE MORNING 90 capsule 3  . fluticasone (FLONASE) 50 MCG/ACT nasal spray Place 1-2 sprays into both nostrils daily as needed. 16 g 5  . folic acid (FOLVITE) 1 MG tablet TAKE 1 TABLET BY MOUTH EVERY DAY 90 tablet 1  . furosemide (LASIX) 20 MG tablet TAKE 1 TABLET BY MOUTH EVERY DAY AS NEEDED FOR SWELLING 20 tablet 1  . lidocaine-prilocaine (EMLA) cream Apply to the Port-A-Cath site 30-60-minute before treatment. 30 g 0  . lisinopril (ZESTRIL) 20 MG tablet TAKE 2 TABLETS BY MOUTH EVERY DAY 180 tablet 2  . morphine (MS CONTIN) 60 MG 12 hr tablet Take 1 tablet (60 mg total) by mouth 2 (two) times daily. 60 tablet 0  . morphine (MSIR) 30 MG tablet Take 1 tablet (30 mg total) by mouth every 4 (four) hours as needed (breakthrough pain). 60 tablet 0  . Olopatadine HCl 0.2 % SOLN INSTILL 1 DROP INTO AFFECTED EYE(S) EVERY DAY 2.5 mL 1  . ondansetron (ZOFRAN) 8 MG tablet Take 1 tablet (8 mg total) by mouth every 8 (eight) hours as needed for nausea or vomiting. 20 tablet 0  . predniSONE (DELTASONE) 50 MG tablet 1 tablet by mouth 13 hours ,  7 hours and 1 hour  before CT scan (Patient taking differently: 1 tablet by mouth 13 hours ,7 hours and 1 hour  before CT scan) 3 tablet 4  . prochlorperazine (COMPAZINE) 10 MG tablet Take 1 tablet (10 mg total) by mouth every 6 (six) hours as needed. 30 tablet 2  . triamcinolone (KENALOG) 0.025 % ointment Apply 1 application topically 2 (two) times daily. 30 g 0  . umeclidinium bromide (INCRUSE ELLIPTA) 62.5 MCG/INH AEPB Inhale 1 puff into the lungs daily. 1 each 11   No current facility-administered medications for this visit.    Allergies  Allergen Reactions  . Contrast Media [Iodinated Diagnostic Agents] Hives and Other (See Comments)    No reaction provided.  . Iohexol Hives     pt needs 13 hour prep--prev hives; added allergy 06/02/07 slg   . Sulfa Drugs Cross Reactors Hives  . Co-Trimoxazole Injection  [Sulfamethoxazole-Trimethoprim] Rash    Family History  Problem Relation Age of Onset  . Diabetes Father   . Lymphoma Father   . Stroke Father   . Liver cancer Mother   . Lung cancer Mother   . Cancer Mother   . Cancer Sister 43       ovarian cancer  . Hyperlipidemia Brother   . Healthy Daughter   . Healthy Son   . Healthy Son     Social History   Socioeconomic History  . Marital status: Divorced    Spouse name: Not on file  . Number of children: 3  . Years of education: Not on file  . Highest education level: Not on file  Occupational History  . Occupation: DISABLED    Employer: UNEMPLOYED  Tobacco Use  . Smoking status: Former Smoker    Packs/day: 2.00    Years: 30.00    Pack years: 60.00    Quit date: 10/07/2005    Years since quitting: 14.2  . Smokeless tobacco: Never Used  Vaping Use  . Vaping Use: Never used  Substance and Sexual Activity  . Alcohol use: No  . Drug use: No  . Sexual activity: Never  Other Topics Concern  . Not on file  Social History Narrative   Divorced (alcoholic)    Regular exercise: yes, walking   Diet: fruits and veggies, walking    No living will,  HCPOA (son Cruz Condon III), DNR (reviewed 2014)               Social Determinants of Health   Financial Resource Strain: Low Risk   . Difficulty of Paying Living Expenses: Not hard at all  Food Insecurity: No Food Insecurity  . Worried About Charity fundraiser in the Last Year: Never true  . Ran Out of Food in the Last Year: Never true  Transportation Needs: No Transportation Needs  . Lack of Transportation (Medical): No  . Lack of Transportation (Non-Medical): No  Physical Activity: Inactive  . Days of Exercise per Week: 0 days  . Minutes of Exercise per Session: 0 min  Stress: No Stress Concern Present  . Feeling of Stress : Not at all  Social Connections: Not on file  Intimate Partner Violence: Not At Risk  . Fear of Current or Ex-Partner: No  . Emotionally Abused:  No  . Physically Abused: No  . Sexually Abused: No     Constitutional: Denies fever, malaise, fatigue, headache or abrupt weight changes.  Respiratory: Denies difficulty breathing, shortness of breath, cough or sputum production.  Cardiovascular: Denies chest pain, chest tightness, palpitations or swelling in the hands or feet.  Musculoskeletal: Denies decrease in range of motion, difficulty with gait, muscle pain or joint pain and swelling.   No other specific complaints in a complete review of systems (except as listed in HPI above).  Objective:   Physical Exam   BP 132/70   Pulse 92   Temp 98 F (36.7 C) (Temporal)   Wt 144 lb (65.3 kg)   SpO2 97%   BMI 26.34 kg/m  Wt Readings from Last 3 Encounters:  01/18/20 144 lb (65.3 kg)  01/11/20 145 lb 11.2 oz (66.1 kg)  12/21/19 146 lb 11.2 oz (66.5 kg)    General: Appears her stated age, in NAD. Cardiovascular: Normal rate. Pulmonary/Chest: Normal effort. Musculoskeletal: Gait slow and steady with use of cane. Neurological: Alert and oriented.    BMET    Component Value Date/Time   NA 140 01/11/2020 0949   NA 142 12/24/2016 0816   K 3.8 01/11/2020 0949   K 4.4 12/24/2016 0816   CL 108 01/11/2020 0949   CL 106 06/30/2012 1004   CO2 24 01/11/2020 0949   CO2 28 12/24/2016 0816   GLUCOSE 179 (H) 01/11/2020 0949   GLUCOSE 152 (H) 12/24/2016 0816   GLUCOSE 108 (H) 06/30/2012 1004   BUN 10 01/11/2020 0949   BUN 15.8 12/24/2016 0816   CREATININE 0.79 01/11/2020 0949   CREATININE 0.93 12/21/2019 0923   CREATININE 1.1 12/24/2016 0816   CALCIUM 9.4 01/11/2020 0949   CALCIUM 9.6 12/24/2016 0816   GFRNONAA >60 01/11/2020 0949   GFRNONAA >60 12/21/2019 0923   GFRAA >60 09/28/2019 1032   GFRAA >60 07/07/2019 0825    Lipid Panel     Component Value Date/Time   CHOL 122 02/19/2019 0733   TRIG 107.0 02/19/2019 0733   HDL 41.00 02/19/2019 0733   CHOLHDL 3 02/19/2019 0733   VLDL 21.4 02/19/2019 0733   LDLCALC 59  02/19/2019 0733    CBC    Component Value Date/Time   WBC 8.2 01/11/2020 0949   RBC 3.76 (L) 01/11/2020 0949   HGB 11.5 (L) 01/11/2020 0949   HGB 10.9 (L) 12/21/2019 0923   HGB 11.5 (L) 12/24/2016 0816   HCT 36.1 01/11/2020 0949   HCT 37.0 12/24/2016 0816   PLT 332 01/11/2020 0949   PLT 342 12/21/2019 0923   PLT 351 12/24/2016 0816   MCV 96.0 01/11/2020 0949   MCV 99.7 12/24/2016 0816   MCH 30.6 01/11/2020 0949   MCHC 31.9 01/11/2020 0949   RDW 13.6 01/11/2020 0949   RDW 15.0 (H) 12/24/2016 0816   LYMPHSABS 1.4 01/11/2020 0949   LYMPHSABS 1.5 12/24/2016 0816   MONOABS 0.8 01/11/2020 0949   MONOABS 1.1 (H) 12/24/2016 0816   EOSABS 0.0 01/11/2020 0949   EOSABS 0.1 12/24/2016 0816   BASOSABS 0.0 01/11/2020 0949   BASOSABS 0.0 12/24/2016 0816    Hgb A1C Lab Results  Component Value Date   HGBA1C 5.4 09/03/2019           Assessment & Plan:    [place] Silversmith, NP This visit occurred during the SARS-CoV-2 public health emergency.  Safety protocols were in place, including screening questions prior to the visit, additional usage of staff PPE, and extensive cleaning of exam room while observing appropriate contact time as indicated for disinfecting solutions.

## 2020-01-20 ENCOUNTER — Telehealth: Payer: Self-pay

## 2020-01-20 NOTE — Telephone Encounter (Signed)
Pt went to get OTC Calcium 800 mg and when pt spoke with pharmacist at [facility] pt was advised to find out if pt should be taking Calcium carbonate or citrate. Pt last seen 01/18/20. Pt request cb when reviewed by Avie Echevaria NP.

## 2020-01-20 NOTE — Telephone Encounter (Signed)
carbonate

## 2020-01-21 NOTE — Telephone Encounter (Signed)
Pt is aware as instructed 

## 2020-01-24 ENCOUNTER — Other Ambulatory Visit: Payer: Self-pay | Admitting: Medical Oncology

## 2020-01-24 ENCOUNTER — Telehealth: Payer: Self-pay | Admitting: Medical Oncology

## 2020-01-24 ENCOUNTER — Other Ambulatory Visit: Payer: Self-pay | Admitting: Internal Medicine

## 2020-01-24 DIAGNOSIS — C3490 Malignant neoplasm of unspecified part of unspecified bronchus or lung: Secondary | ICD-10-CM

## 2020-01-24 DIAGNOSIS — C3410 Malignant neoplasm of upper lobe, unspecified bronchus or lung: Secondary | ICD-10-CM

## 2020-01-24 MED ORDER — MORPHINE SULFATE ER 60 MG PO TBCR: 60.0000 mg | EXTENDED_RELEASE_TABLET | Freq: Two times a day (BID) | ORAL | 0 refills | Status: DC

## 2020-01-24 NOTE — Telephone Encounter (Signed)
medication refill rerquested for MS contin 60. She will be out of pain med today .

## 2020-02-01 ENCOUNTER — Other Ambulatory Visit: Payer: Self-pay

## 2020-02-01 ENCOUNTER — Inpatient Hospital Stay: Payer: Medicare (Managed Care)

## 2020-02-01 ENCOUNTER — Telehealth: Payer: Self-pay | Admitting: Medical Oncology

## 2020-02-01 ENCOUNTER — Inpatient Hospital Stay (HOSPITAL_BASED_OUTPATIENT_CLINIC_OR_DEPARTMENT_OTHER): Payer: Medicare (Managed Care) | Admitting: Internal Medicine

## 2020-02-01 ENCOUNTER — Encounter: Payer: Self-pay | Admitting: Internal Medicine

## 2020-02-01 VITALS — BP 141/61 | HR 79 | Temp 98.4°F | Resp 18 | Wt 147.1 lb

## 2020-02-01 DIAGNOSIS — C3490 Malignant neoplasm of unspecified part of unspecified bronchus or lung: Secondary | ICD-10-CM

## 2020-02-01 DIAGNOSIS — Z95828 Presence of other vascular implants and grafts: Secondary | ICD-10-CM

## 2020-02-01 DIAGNOSIS — D6481 Anemia due to antineoplastic chemotherapy: Secondary | ICD-10-CM

## 2020-02-01 DIAGNOSIS — T451X5A Adverse effect of antineoplastic and immunosuppressive drugs, initial encounter: Secondary | ICD-10-CM | POA: Diagnosis not present

## 2020-02-01 DIAGNOSIS — Z5111 Encounter for antineoplastic chemotherapy: Secondary | ICD-10-CM

## 2020-02-01 DIAGNOSIS — C3411 Malignant neoplasm of upper lobe, right bronchus or lung: Secondary | ICD-10-CM

## 2020-02-01 LAB — CBC WITH DIFFERENTIAL (CANCER CENTER ONLY)
Abs Immature Granulocytes: 0.02 10*3/uL (ref 0.00–0.07)
Basophils Absolute: 0 10*3/uL (ref 0.0–0.1)
Basophils Relative: 1 %
Eosinophils Absolute: 0.1 10*3/uL (ref 0.0–0.5)
Eosinophils Relative: 1 %
HCT: 34.1 % — ABNORMAL LOW (ref 36.0–46.0)
Hemoglobin: 10.9 g/dL — ABNORMAL LOW (ref 12.0–15.0)
Immature Granulocytes: 0 %
Lymphocytes Relative: 21 %
Lymphs Abs: 1.8 10*3/uL (ref 0.7–4.0)
MCH: 31 pg (ref 26.0–34.0)
MCHC: 32 g/dL (ref 30.0–36.0)
MCV: 96.9 fL (ref 80.0–100.0)
Monocytes Absolute: 0.9 10*3/uL (ref 0.1–1.0)
Monocytes Relative: 11 %
Neutro Abs: 5.7 10*3/uL (ref 1.7–7.7)
Neutrophils Relative %: 66 %
Platelet Count: 341 10*3/uL (ref 150–400)
RBC: 3.52 MIL/uL — ABNORMAL LOW (ref 3.87–5.11)
RDW: 14.1 % (ref 11.5–15.5)
WBC Count: 8.6 10*3/uL (ref 4.0–10.5)
nRBC: 0 % (ref 0.0–0.2)

## 2020-02-01 LAB — CMP (CANCER CENTER ONLY)
ALT: 11 U/L (ref 0–44)
AST: 23 U/L (ref 15–41)
Albumin: 3.1 g/dL — ABNORMAL LOW (ref 3.5–5.0)
Alkaline Phosphatase: 71 U/L (ref 38–126)
Anion gap: 8 (ref 5–15)
BUN: 10 mg/dL (ref 8–23)
CO2: 25 mmol/L (ref 22–32)
Calcium: 9.1 mg/dL (ref 8.9–10.3)
Chloride: 105 mmol/L (ref 98–111)
Creatinine: 0.86 mg/dL (ref 0.44–1.00)
GFR, Estimated: >60 mL/min (ref >60)
Glucose, Bld: 204 mg/dL — ABNORMAL HIGH (ref 70–99)
Potassium: 4.1 mmol/L (ref 3.5–5.1)
Sodium: 138 mmol/L (ref 135–145)
Total Bilirubin: 0.5 mg/dL (ref 0.3–1.2)
Total Protein: 6.8 g/dL (ref 6.5–8.1)

## 2020-02-01 MED ORDER — SODIUM CHLORIDE 0.9 % IV SOLN
10.0000 mg | Freq: Once | INTRAVENOUS | Status: AC
  Administered 2020-02-01: 10 mg via INTRAVENOUS
  Filled 2020-02-01: qty 10

## 2020-02-01 MED ORDER — ONDANSETRON HCL 8 MG PO TABS
ORAL_TABLET | ORAL | Status: AC
  Filled 2020-02-01: qty 1

## 2020-02-01 MED ORDER — SODIUM CHLORIDE 0.9 % IV SOLN
Freq: Once | INTRAVENOUS | Status: AC
  Filled 2020-02-01: qty 250

## 2020-02-01 MED ORDER — SODIUM CHLORIDE 0.9% FLUSH
10.0000 mL | INTRAVENOUS | Status: DC | PRN
  Administered 2020-02-01: 10 mL via INTRAVENOUS
  Filled 2020-02-01: qty 10

## 2020-02-01 MED ORDER — ONDANSETRON HCL 8 MG PO TABS
8.0000 mg | ORAL_TABLET | Freq: Once | ORAL | Status: AC
  Administered 2020-02-01: 8 mg via ORAL

## 2020-02-01 MED ORDER — SODIUM CHLORIDE 0.9% FLUSH
10.0000 mL | INTRAVENOUS | Status: DC | PRN
  Administered 2020-02-01: 10 mL
  Filled 2020-02-01: qty 10

## 2020-02-01 MED ORDER — SODIUM CHLORIDE 0.9 % IV SOLN
500.0000 mg/m2 | Freq: Once | INTRAVENOUS | Status: AC
  Administered 2020-02-01: 800 mg via INTRAVENOUS
  Filled 2020-02-01: qty 20

## 2020-02-01 MED ORDER — HEPARIN SOD (PORK) LOCK FLUSH 100 UNIT/ML IV SOLN
500.0000 [IU] | Freq: Once | INTRAVENOUS | Status: AC | PRN
  Administered 2020-02-01: 500 [IU]
  Filled 2020-02-01: qty 5

## 2020-02-01 NOTE — Telephone Encounter (Signed)
Received [facility] notive that MS 60 mg may require prior auth .   Pt just received her full prescription on 01/24/20. She will call us if there is a problem with further prescription.  [facility] (p) [phone] or fax [phone].

## 2020-02-01 NOTE — Progress Notes (Signed)
[facility] [phone]   [phone]) [phone]  OFFICE PROGRESS NOTE  [name], MD [place] [address]  DIAGNOSIS: Metastatic non-small cell lung cancer initially diagnosed as locally advanced stage IIIB with a right Pancoast tumor involving the vertebral body as well as prominent canal invasion with spinal cord compression in October 2007. The patient also has metastatic disease to the brain in April 2008.  PRIOR THERAPY: 1. Status post concurrent chemoradiation with weekly carboplatin and paclitaxel, last dose was given November 18, 2005. 2. 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 3. Status post gamma knife radiotherapy to a solitary brain lesion located in the superior frontal area of the brain at [facility] in April of 2008. 4. Status post palliative radiotherapy to the lateral abdominal wall metastatic lesion under the care of Dr. Lisbeth Renshaw, completed March of 2009. 5. Status post 6 cycles of systemic chemotherapy with carboplatin and Alimta. Last dose was given July 26, 2007 with disease stabilization. 6. Gamma knife stereotactic radiotherapy to 2 brain lesions one involving the right frontal dural based as well as right parietal lesion performed on 05/07/2012 under the care of Dr. Vallarie Mare at [facility].  CURRENT THERAPY: Maintenance systemic chemotherapy with Alimta 500 MG/M2 every 3 weeks, status post 215 cycles.  INTERVAL HISTORY: Kalisi Bevill Breach 70 y.o. female returns to the clinic today for follow-up visit.  The patient is feeling fine today with no concerning complaints except for occasional nausea after the treatment.  She denied having any chest pain, shortness of breath, cough or hemoptysis.  She has no current vomiting, diarrhea or constipation.  She has no weight loss or night sweats.  She has no headache or visual changes.  She  continues to tolerate her maintenance treatment with Alimta fairly well.  The patient is here today for evaluation before starting cycle #216.  MEDICAL HISTORY: Past Medical History:  Diagnosis Date  . Anemia   . 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 Carcinomas, basal cell 01/22/2019   two places on face  . CVA (cerebral vascular accident) ([place]) 04/2014   pt states had 2 cva's within 2 wks  . DJD (degenerative joint disease), cervical   . Fibromyalgia   . H/O: pneumonia   . History of tobacco abuse quit 10/08   on nicotine patch  . Hypertension   . Hypokalemia   . Lung cancer ([place]) dx'd 09/2005   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 had chemo and radiation for lung ca  . Thrush 12/11/2010    ALLERGIES:  is allergic to contrast media [iodinated diagnostic agents], iohexol, sulfa drugs cross reactors, and co-trimoxazole injection [sulfamethoxazole-trimethoprim].  MEDICATIONS:  Current Outpatient Medications  Medication Sig Dispense Refill  . albuterol (VENTOLIN HFA) 108 (90 Base) MCG/ACT inhaler Inhale 2 puffs into the lungs every 6 (six) hours as needed for wheezing or shortness of breath. 8 g 2  . alendronate (FOSAMAX) 70 MG tablet Take 1 tablet (70 mg total) by mouth every 7 (seven) days. Take with a full glass of water on an empty stomach. 4 tablet 11  . amLODipine (NORVASC) 10 MG tablet TAKE 1 TABLET BY MOUTH EVERY DAY 90 tablet 2  . aspirin 325 MG EC tablet TAKE 1 TABLET BY MOUTH EVERY DAY 90 tablet 0  . atorvastatin (LIPITOR) 40 MG tablet TAKE 1 TABLET BY MOUTH EVERY DAY AT 6 PM  90 tablet 1  . Cholecalciferol (VITAMIN D3) 50 MCG (2000 UT) TABS Take by mouth.    . diphenhydrAMINE (BENADRYL) 25 MG tablet Take 1 tablet (25 mg total) by mouth as directed. 2 hours before CT scan. 5 tablet 0  . FeFum-FePoly-FA-B Cmp-C-Biot (INTEGRA PLUS) CAPS TAKE 1 CAPSULE BY MOUTH EVERY DAY IN THE MORNING 90 capsule 3  . fluticasone (FLONASE) 50 MCG/ACT nasal spray Place 1-2  sprays into both nostrils daily as needed. 16 g 5  . folic acid (FOLVITE) 1 MG tablet TAKE 1 TABLET BY MOUTH EVERY DAY 90 tablet 1  . furosemide (LASIX) 20 MG tablet TAKE 1 TABLET BY MOUTH EVERY DAY AS NEEDED FOR SWELLING 20 tablet 1  . lidocaine-prilocaine (EMLA) cream Apply to the Port-A-Cath site 30-60-minute before treatment. 30 g 0  . lisinopril (ZESTRIL) 20 MG tablet TAKE 2 TABLETS BY MOUTH EVERY DAY 180 tablet 2  . morphine (MS CONTIN) 60 MG 12 hr tablet Take 1 tablet (60 mg total) by mouth 2 (two) times daily. 60 tablet 0  . morphine (MSIR) 30 MG tablet Take 1 tablet (30 mg total) by mouth every 4 (four) hours as needed (breakthrough pain). 60 tablet 0  . Olopatadine HCl 0.2 % SOLN INSTILL 1 DROP INTO AFFECTED EYE(S) EVERY DAY 2.5 mL 1  . ondansetron (ZOFRAN) 8 MG tablet Take 1 tablet (8 mg total) by mouth every 8 (eight) hours as needed for nausea or vomiting. 20 tablet 0  . predniSONE (DELTASONE) 50 MG tablet 1 tablet by mouth 13 hours ,7 hours and 1 hour  before CT scan (Patient taking differently: 1 tablet by mouth 13 hours ,7 hours and 1 hour  before CT scan) 3 tablet 4  . prochlorperazine (COMPAZINE) 10 MG tablet Take 1 tablet (10 mg total) by mouth every 6 (six) hours as needed. 30 tablet 2  . triamcinolone (KENALOG) 0.025 % ointment Apply 1 application topically 2 (two) times daily. 30 g 0  . umeclidinium bromide (INCRUSE ELLIPTA) 62.5 MCG/INH AEPB Inhale 1 puff into the lungs daily. 1 each 11   No current facility-administered medications for this visit.   Facility-Administered Medications Ordered in Other Visits  Medication Dose Route Frequency Provider Last Rate Last Admin  . sodium chloride flush (NS) 0.9 % injection 10 mL  10 mL Intravenous PRN Curt Bears, MD   10 mL at 02/01/20 1037    SURGICAL HISTORY:  Past Surgical History:  Procedure Laterality Date  . CERVICAL LAMINECTOMY  1995  . KNEE SURGERY  1990   Left x 2  . KYPHOSIS SURGERY  7/08   because lung ca  grew into spinal canal  . LOOP RECORDER IMPLANT N/A 04/19/2014   Procedure: LOOP RECORDER IMPLANT;  Surgeon: Thompson Grayer, MD;  Location: [facility] CATH LAB;  Service: Cardiovascular;  Laterality: N/A;  . OTHER SURGICAL HISTORY  2008   Gamma knife surgery to remove brain met   . PORTACATH PLACEMENT  -[name]   TIP IN CAVOATRIAL JUNCTION  . TEE WITHOUT CARDIOVERSION N/A 04/19/2014   Procedure: TRANSESOPHAGEAL ECHOCARDIOGRAM (TEE);  Surgeon: Larey Dresser, MD;  Location: [facility];  Service: Cardiovascular;  Laterality: N/A;    REVIEW OF SYSTEMS:  A comprehensive review of systems was negative except for: Constitutional: positive for fatigue Gastrointestinal: positive for nausea   PHYSICAL EXAMINATION: General appearance: alert, cooperative and no distress Head: Normocephalic, without obvious abnormality, atraumatic Neck: no adenopathy, no JVD, supple, symmetrical, trachea midline and thyroid not enlarged, symmetric, no  tenderness/mass/nodules Lymph nodes: Cervical, supraclavicular, and axillary nodes normal. Resp: clear to auscultation bilaterally Back: symmetric, no curvature. ROM normal. No CVA tenderness. Cardio: regular rate and rhythm, S1, S2 normal, no murmur, click, rub or gallop GI: soft, non-tender; bowel sounds normal; no masses,  no organomegaly Extremities: extremities normal, atraumatic, no cyanosis or edema  ECOG PERFORMANCE STATUS: 1 - Symptomatic but completely ambulatory  There were no vitals taken for this visit.  LABORATORY DATA: Lab Results  Component Value Date   WBC 8.6 02/01/2020   HGB 10.9 (L) 02/01/2020   HCT 34.1 (L) 02/01/2020   MCV 96.9 02/01/2020   PLT 341 02/01/2020      Chemistry      Component Value Date/Time   NA 140 01/11/2020 0949   NA 142 12/24/2016 0816   K 3.8 01/11/2020 0949   K 4.4 12/24/2016 0816   CL 108 01/11/2020 0949   CL 106 06/30/2012 1004   CO2 24 01/11/2020 0949   CO2 28 12/24/2016 0816   BUN 10 01/11/2020 0949   BUN 15.8  12/24/2016 0816   CREATININE 0.79 01/11/2020 0949   CREATININE 0.93 12/21/2019 0923   CREATININE 1.1 12/24/2016 0816      Component Value Date/Time   CALCIUM 9.4 01/11/2020 0949   CALCIUM 9.6 12/24/2016 0816   ALKPHOS 77 01/11/2020 0949   ALKPHOS 80 12/24/2016 0816   AST 22 01/11/2020 0949   AST 23 12/21/2019 0923   AST 20 12/24/2016 0816   ALT 13 01/11/2020 0949   ALT 14 12/21/2019 0923   ALT 11 12/24/2016 0816   BILITOT 0.3 01/11/2020 0949   BILITOT 0.5 12/21/2019 0923   BILITOT 0.34 12/24/2016 0816       RADIOGRAPHIC STUDIES: No results found.  ASSESSMENT AND PLAN: This is a very pleasant 70 years old white female with metastatic non-small cell lung cancer diagnosed in October 2007 status post concurrent chemoradiation followed by consolidation chemotherapy and she is currently on maintenance treatment with single agent Alimta status post 215 cycles. The patient has been tolerating this treatment well with no concerning adverse effects except for mild fatigue and occasional nausea. I recommended for her to proceed with cycle #216 today as planned. I will see her back for follow-up visit in 3 weeks for evaluation before the next cycle of her treatment. The patient was advised to call immediately if she has any other concerning symptoms in the interval. The patient voices understanding of current disease status and treatment options and is in agreement with the current care plan. All questions were answered. The patient knows to call the clinic with any problems, questions or concerns. We can certainly see the patient much sooner if necessary.  Disclaimer: This note was dictated with voice recognition software. Similar sounding words can inadvertently be transcribed and may not be corrected upon review.

## 2020-02-01 NOTE — Patient Instructions (Signed)
[facility] Discharge Instructions for Patients Receiving Chemotherapy  Today you received the following chemotherapy agents:  Alimta  To help prevent nausea and vomiting after your treatment, we encourage you to take your nausea medication as needed. If you develop nausea and vomiting that is not controlled by your nausea medication, call the clinic.   BELOW ARE SYMPTOMS THAT SHOULD BE REPORTED IMMEDIATELY:  *FEVER GREATER THAN 100.5 F  *CHILLS WITH OR WITHOUT FEVER  NAUSEA AND VOMITING THAT IS NOT CONTROLLED WITH YOUR NAUSEA MEDICATION  *UNUSUAL SHORTNESS OF BREATH  *UNUSUAL BRUISING OR BLEEDING  TENDERNESS IN MOUTH AND THROAT WITH OR WITHOUT PRESENCE OF ULCERS  *URINARY PROBLEMS  *BOWEL PROBLEMS  UNUSUAL RASH Items with * indicate a potential emergency and should be followed up as soon as possible.  Feel free to call the clinic should you have any questions or concerns. The clinic phone number is [phone].  Please show the [place] at check-in to the Emergency Department and triage nurse.

## 2020-02-02 ENCOUNTER — Telehealth: Payer: Self-pay | Admitting: Internal Medicine

## 2020-02-02 NOTE — Telephone Encounter (Signed)
Scheduled appointments per 1/25 los. Will have updated calendar printed for patient at next visit.

## 2020-02-21 ENCOUNTER — Telehealth: Payer: Self-pay | Admitting: Family Medicine

## 2020-02-21 DIAGNOSIS — E119 Type 2 diabetes mellitus without complications: Secondary | ICD-10-CM

## 2020-02-21 NOTE — Telephone Encounter (Signed)
-----   Message from Cloyd Stagers, RT sent at 02/14/2020  2:05 PM EST ----- Regarding: Lab Orders for Thursday 2.24.2022 Please place lab orders for Thursday 2.24.2022, office visit for 6 month f/u on Thursday 3.3.2022 Thank you, Dyke Maes RT(R)

## 2020-02-22 ENCOUNTER — Inpatient Hospital Stay: Payer: Medicare (Managed Care)

## 2020-02-22 ENCOUNTER — Other Ambulatory Visit: Payer: Self-pay | Admitting: Internal Medicine

## 2020-02-22 ENCOUNTER — Encounter: Payer: Self-pay | Admitting: Internal Medicine

## 2020-02-22 ENCOUNTER — Other Ambulatory Visit: Payer: Self-pay

## 2020-02-22 ENCOUNTER — Inpatient Hospital Stay (HOSPITAL_BASED_OUTPATIENT_CLINIC_OR_DEPARTMENT_OTHER): Payer: Medicare (Managed Care) | Admitting: Internal Medicine

## 2020-02-22 ENCOUNTER — Inpatient Hospital Stay: Payer: Medicare (Managed Care) | Attending: Physician Assistant | Admitting: Internal Medicine

## 2020-02-22 VITALS — BP 158/60 | HR 91 | Temp 97.7°F | Resp 13 | Ht 62.0 in | Wt 148.9 lb

## 2020-02-22 DIAGNOSIS — Z79899 Other long term (current) drug therapy: Secondary | ICD-10-CM | POA: Insufficient documentation

## 2020-02-22 DIAGNOSIS — C3490 Malignant neoplasm of unspecified part of unspecified bronchus or lung: Secondary | ICD-10-CM

## 2020-02-22 DIAGNOSIS — C7931 Secondary malignant neoplasm of brain: Secondary | ICD-10-CM | POA: Insufficient documentation

## 2020-02-22 DIAGNOSIS — Z5111 Encounter for antineoplastic chemotherapy: Secondary | ICD-10-CM | POA: Insufficient documentation

## 2020-02-22 DIAGNOSIS — C3411 Malignant neoplasm of upper lobe, right bronchus or lung: Secondary | ICD-10-CM

## 2020-02-22 DIAGNOSIS — G893 Neoplasm related pain (acute) (chronic): Secondary | ICD-10-CM | POA: Diagnosis not present

## 2020-02-22 DIAGNOSIS — M797 Fibromyalgia: Secondary | ICD-10-CM | POA: Insufficient documentation

## 2020-02-22 DIAGNOSIS — R110 Nausea: Secondary | ICD-10-CM | POA: Insufficient documentation

## 2020-02-22 DIAGNOSIS — Z87891 Personal history of nicotine dependence: Secondary | ICD-10-CM | POA: Insufficient documentation

## 2020-02-22 DIAGNOSIS — Z8673 Personal history of transient ischemic attack (TIA), and cerebral infarction without residual deficits: Secondary | ICD-10-CM | POA: Diagnosis not present

## 2020-02-22 DIAGNOSIS — M47812 Spondylosis without myelopathy or radiculopathy, cervical region: Secondary | ICD-10-CM | POA: Diagnosis not present

## 2020-02-22 DIAGNOSIS — C3491 Malignant neoplasm of unspecified part of right bronchus or lung: Secondary | ICD-10-CM | POA: Diagnosis not present

## 2020-02-22 DIAGNOSIS — Z923 Personal history of irradiation: Secondary | ICD-10-CM | POA: Diagnosis not present

## 2020-02-22 DIAGNOSIS — D6481 Anemia due to antineoplastic chemotherapy: Secondary | ICD-10-CM

## 2020-02-22 DIAGNOSIS — I10 Essential (primary) hypertension: Secondary | ICD-10-CM | POA: Diagnosis not present

## 2020-02-22 DIAGNOSIS — Z79891 Long term (current) use of opiate analgesic: Secondary | ICD-10-CM | POA: Diagnosis not present

## 2020-02-22 DIAGNOSIS — Z7982 Long term (current) use of aspirin: Secondary | ICD-10-CM | POA: Diagnosis not present

## 2020-02-22 DIAGNOSIS — T451X5A Adverse effect of antineoplastic and immunosuppressive drugs, initial encounter: Secondary | ICD-10-CM

## 2020-02-22 DIAGNOSIS — Z95828 Presence of other vascular implants and grafts: Secondary | ICD-10-CM

## 2020-02-22 DIAGNOSIS — C3410 Malignant neoplasm of upper lobe, unspecified bronchus or lung: Secondary | ICD-10-CM

## 2020-02-22 LAB — CMP (CANCER CENTER ONLY)
ALT: 11 U/L (ref 0–44)
AST: 20 U/L (ref 15–41)
Albumin: 3.4 g/dL — ABNORMAL LOW (ref 3.5–5.0)
Alkaline Phosphatase: 76 U/L (ref 38–126)
Anion gap: 6 (ref 5–15)
BUN: 15 mg/dL (ref 8–23)
CO2: 25 mmol/L (ref 22–32)
Calcium: 9.3 mg/dL (ref 8.9–10.3)
Chloride: 108 mmol/L (ref 98–111)
Creatinine: 0.83 mg/dL (ref 0.44–1.00)
GFR, Estimated: >60 mL/min (ref >60)
Glucose, Bld: 152 mg/dL — ABNORMAL HIGH (ref 70–99)
Potassium: 4.3 mmol/L (ref 3.5–5.1)
Sodium: 139 mmol/L (ref 135–145)
Total Bilirubin: 0.3 mg/dL (ref 0.3–1.2)
Total Protein: 7.1 g/dL (ref 6.5–8.1)

## 2020-02-22 LAB — CBC WITH DIFFERENTIAL (CANCER CENTER ONLY)
Abs Immature Granulocytes: 0.06 10*3/uL (ref 0.00–0.07)
Basophils Absolute: 0 10*3/uL (ref 0.0–0.1)
Basophils Relative: 0 %
Eosinophils Absolute: 0.1 10*3/uL (ref 0.0–0.5)
Eosinophils Relative: 1 %
HCT: 35 % — ABNORMAL LOW (ref 36.0–46.0)
Hemoglobin: 11 g/dL — ABNORMAL LOW (ref 12.0–15.0)
Immature Granulocytes: 1 %
Lymphocytes Relative: 19 %
Lymphs Abs: 2 10*3/uL (ref 0.7–4.0)
MCH: 30.5 pg (ref 26.0–34.0)
MCHC: 31.4 g/dL (ref 30.0–36.0)
MCV: 97 fL (ref 80.0–100.0)
Monocytes Absolute: 1 10*3/uL (ref 0.1–1.0)
Monocytes Relative: 9 %
Neutro Abs: 7.4 10*3/uL (ref 1.7–7.7)
Neutrophils Relative %: 70 %
Platelet Count: 412 10*3/uL — ABNORMAL HIGH (ref 150–400)
RBC: 3.61 MIL/uL — ABNORMAL LOW (ref 3.87–5.11)
RDW: 14.6 % (ref 11.5–15.5)
WBC Count: 10.6 10*3/uL — ABNORMAL HIGH (ref 4.0–10.5)
nRBC: 0 % (ref 0.0–0.2)

## 2020-02-22 MED ORDER — CYANOCOBALAMIN 1000 MCG/ML IJ SOLN
1000.0000 ug | Freq: Once | INTRAMUSCULAR | Status: AC
  Administered 2020-02-22: 1000 ug via INTRAMUSCULAR

## 2020-02-22 MED ORDER — SODIUM CHLORIDE 0.9% FLUSH
10.0000 mL | INTRAVENOUS | Status: DC | PRN
  Administered 2020-02-22: 10 mL
  Filled 2020-02-22: qty 10

## 2020-02-22 MED ORDER — ONDANSETRON HCL 8 MG PO TABS
ORAL_TABLET | ORAL | Status: AC
  Filled 2020-02-22: qty 1

## 2020-02-22 MED ORDER — CYANOCOBALAMIN 1000 MCG/ML IJ SOLN
INTRAMUSCULAR | Status: AC
  Filled 2020-02-22: qty 1

## 2020-02-22 MED ORDER — SODIUM CHLORIDE 0.9 % IV SOLN
500.0000 mg/m2 | Freq: Once | INTRAVENOUS | Status: AC
  Administered 2020-02-22: 800 mg via INTRAVENOUS
  Filled 2020-02-22: qty 20

## 2020-02-22 MED ORDER — SODIUM CHLORIDE 0.9 % IV SOLN
Freq: Once | INTRAVENOUS | Status: AC
  Filled 2020-02-22: qty 250

## 2020-02-22 MED ORDER — SODIUM CHLORIDE 0.9% FLUSH
10.0000 mL | INTRAVENOUS | Status: DC | PRN
  Administered 2020-02-22: 10 mL via INTRAVENOUS
  Filled 2020-02-22: qty 10

## 2020-02-22 MED ORDER — ONDANSETRON HCL 8 MG PO TABS
8.0000 mg | ORAL_TABLET | Freq: Once | ORAL | Status: AC
  Administered 2020-02-22: 8 mg via ORAL

## 2020-02-22 MED ORDER — MORPHINE SULFATE ER 60 MG PO TBCR: 60.0000 mg | EXTENDED_RELEASE_TABLET | Freq: Two times a day (BID) | ORAL | 0 refills | Status: DC

## 2020-02-22 MED ORDER — HEPARIN SOD (PORK) LOCK FLUSH 100 UNIT/ML IV SOLN
500.0000 [IU] | Freq: Once | INTRAVENOUS | Status: AC | PRN
  Administered 2020-02-22: 500 [IU]
  Filled 2020-02-22: qty 5

## 2020-02-22 MED ORDER — DEXAMETHASONE SODIUM PHOSPHATE 100 MG/10ML IJ SOLN
10.0000 mg | Freq: Once | INTRAMUSCULAR | Status: AC
  Administered 2020-02-22: 10 mg via INTRAVENOUS
  Filled 2020-02-22: qty 10

## 2020-02-22 NOTE — Patient Instructions (Signed)
[facility] Discharge Instructions for Patients Receiving Chemotherapy  Today you received the following chemotherapy agent: Pemetrexed (Alimta)  To help prevent nausea and vomiting after your treatment, we encourage you to take your nausea medication as directed by your MD.   If you develop nausea and vomiting that is not controlled by your nausea medication, call the clinic.   BELOW ARE SYMPTOMS THAT SHOULD BE REPORTED IMMEDIATELY:  *FEVER GREATER THAN 100.5 F  *CHILLS WITH OR WITHOUT FEVER  NAUSEA AND VOMITING THAT IS NOT CONTROLLED WITH YOUR NAUSEA MEDICATION  *UNUSUAL SHORTNESS OF BREATH  *UNUSUAL BRUISING OR BLEEDING  TENDERNESS IN MOUTH AND THROAT WITH OR WITHOUT PRESENCE OF ULCERS  *URINARY PROBLEMS  *BOWEL PROBLEMS  UNUSUAL RASH Items with * indicate a potential emergency and should be followed up as soon as possible.  Feel free to call the clinic should you have any questions or concerns. The clinic phone number is [phone].  Please show the [place] at check-in to the Emergency Department and triage nurse.

## 2020-02-22 NOTE — Progress Notes (Signed)
No critical labs need to be addressed urgently. We will discuss labs in detail at upcoming office visit.   

## 2020-02-22 NOTE — Progress Notes (Signed)
[place] [phone]   [phone]  OFFICE PROGRESS NOTE  [name], MD [place] [address]  DIAGNOSIS: Metastatic non-small cell lung cancer initially diagnosed as locally advanced stage IIIB with a right Pancoast tumor involving the vertebral body as well as prominent canal invasion with spinal cord compression in October 2007. The patient also has metastatic disease to the brain in April 2008.  PRIOR THERAPY: 1. Status post concurrent chemoradiation with weekly carboplatin and paclitaxel, last dose was given November 18, 2005. 2. 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 3. Status post gamma knife radiotherapy to a solitary brain lesion located in the superior frontal area of the brain at [facility] in April of 2008. 4. Status post palliative radiotherapy to the lateral abdominal wall metastatic lesion under the care of Dr. Lisbeth Renshaw, completed March of 2009. 5. Status post 6 cycles of systemic chemotherapy with carboplatin and Alimta. Last dose was given July 26, 2007 with disease stabilization. 6. Gamma knife stereotactic radiotherapy to 2 brain lesions one involving the right frontal dural based as well as right parietal lesion performed on 05/07/2012 under the care of Dr. Vallarie Mare at [facility].  CURRENT THERAPY: Maintenance systemic chemotherapy with Alimta 500 MG/M2 every 3 weeks, status post 216 cycles.  INTERVAL HISTORY: Danielle [name] 70 y.o. female returns to the clinic today for follow-up visit.  The patient is feeling fine today with no concerning complaints except for mild swelling of the lower extremities improved when the patient lays down in the evening.  She denied having any current chest pain, shortness of breath, cough or hemoptysis.  She denied having any nausea, vomiting, diarrhea or constipation.  She has no  headache or visual changes.  She is requesting refill of the MS Contin today.  She had MRI of the brain performed yesterday at [facility] and that showed no evidence for disease progression.  She is here today for evaluation before starting cycle #217.   MEDICAL HISTORY: Past Medical History:  Diagnosis Date  . Anemia   . 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 Carcinomas, basal cell 01/22/2019   two places on face  . CVA (cerebral vascular accident) ([place]) 04/2014   pt states had 2 cva's within 2 wks  . DJD (degenerative joint disease), cervical   . Fibromyalgia   . H/O: pneumonia   . History of tobacco abuse quit 10/08   on nicotine patch  . Hypertension   . Hypokalemia   . Lung cancer ([place]) dx'd 09/2005   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 had chemo and radiation for lung ca  . Thrush 12/11/2010    ALLERGIES:  is allergic to contrast media [iodinated diagnostic agents], iohexol, sulfa drugs cross reactors, and co-trimoxazole injection [sulfamethoxazole-trimethoprim].  MEDICATIONS:  Current Outpatient Medications  Medication Sig Dispense Refill  . albuterol (VENTOLIN HFA) 108 (90 Base) MCG/ACT inhaler Inhale 2 puffs into the lungs every 6 (six) hours as needed for wheezing or shortness of breath. 8 g 2  . alendronate (FOSAMAX) 70 MG tablet Take 1 tablet (70 mg total) by mouth every 7 (seven) days. Take with a full glass of water on an empty stomach. 4 tablet 11  . amLODipine (NORVASC) 10 MG tablet TAKE 1 TABLET BY MOUTH EVERY DAY 90 tablet 2  . aspirin 325 MG EC tablet TAKE 1 TABLET BY MOUTH  EVERY DAY 90 tablet 0  . atorvastatin (LIPITOR) 40 MG tablet TAKE 1 TABLET BY MOUTH EVERY DAY AT 6 PM 90 tablet 1  . Cholecalciferol (VITAMIN D3) 50 MCG (2000 UT) TABS Take by mouth.    . diphenhydrAMINE (BENADRYL) 25 MG tablet Take 1 tablet (25 mg total) by mouth as directed. 2 hours before CT scan. 5 tablet 0  . FeFum-FePoly-FA-B Cmp-C-Biot (INTEGRA PLUS)  CAPS TAKE 1 CAPSULE BY MOUTH EVERY DAY IN THE MORNING 90 capsule 3  . fluticasone (FLONASE) 50 MCG/ACT nasal spray Place 1-2 sprays into both nostrils daily as needed. 16 g 5  . folic acid (FOLVITE) 1 MG tablet TAKE 1 TABLET BY MOUTH EVERY DAY 90 tablet 1  . furosemide (LASIX) 20 MG tablet TAKE 1 TABLET BY MOUTH EVERY DAY AS NEEDED FOR SWELLING 20 tablet 1  . lidocaine-prilocaine (EMLA) cream Apply to the Port-A-Cath site 30-60-minute before treatment. 30 g 0  . lisinopril (ZESTRIL) 20 MG tablet TAKE 2 TABLETS BY MOUTH EVERY DAY 180 tablet 2  . morphine (MS CONTIN) 60 MG 12 hr tablet Take 1 tablet (60 mg total) by mouth 2 (two) times daily. 60 tablet 0  . morphine (MSIR) 30 MG tablet Take 1 tablet (30 mg total) by mouth every 4 (four) hours as needed (breakthrough pain). 60 tablet 0  . Olopatadine HCl 0.2 % SOLN INSTILL 1 DROP INTO AFFECTED EYE(S) EVERY DAY 2.5 mL 1  . ondansetron (ZOFRAN) 8 MG tablet Take 1 tablet (8 mg total) by mouth every 8 (eight) hours as needed for nausea or vomiting. 20 tablet 0  . predniSONE (DELTASONE) 50 MG tablet 1 tablet by mouth 13 hours ,7 hours and 1 hour  before CT scan (Patient taking differently: 1 tablet by mouth 13 hours ,7 hours and 1 hour  before CT scan) 3 tablet 4  . prochlorperazine (COMPAZINE) 10 MG tablet Take 1 tablet (10 mg total) by mouth every 6 (six) hours as needed. 30 tablet 2  . triamcinolone (KENALOG) 0.025 % ointment Apply 1 application topically 2 (two) times daily. 30 g 0  . umeclidinium bromide (INCRUSE ELLIPTA) 62.5 MCG/INH AEPB Inhale 1 puff into the lungs daily. 1 each 11   No current facility-administered medications for this visit.   Facility-Administered Medications Ordered in Other Visits  Medication Dose Route Frequency Provider Last Rate Last Admin  . sodium chloride flush (NS) 0.9 % injection 10 mL  10 mL Intravenous PRN Curt Bears, MD   10 mL at 02/22/20 0954    SURGICAL HISTORY:  Past Surgical History:  Procedure  Laterality Date  . CERVICAL LAMINECTOMY  1995  . KNEE SURGERY  1990   Left x 2  . 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 LOOP RECORDER IMPLANT N/A 04/19/2014   Procedure: LOOP RECORDER IMPLANT;  Surgeon: Thompson Grayer, MD;  Location: [facility] CATH LAB;  Service: Cardiovascular;  Laterality: N/A;  . OTHER SURGICAL HISTORY  2008   Gamma knife surgery to remove brain met   . PORTACATH PLACEMENT  -[name]   TIP IN CAVOATRIAL JUNCTION  . TEE WITHOUT CARDIOVERSION N/A 04/19/2014   Procedure: TRANSESOPHAGEAL ECHOCARDIOGRAM (TEE);  Surgeon: Larey Dresser, MD;  Location: [facility] ENDOSCOPY;  Service: Cardiovascular;  Laterality: N/A;    REVIEW OF SYSTEMS:  A comprehensive review of systems was negative except for: Constitutional: positive for fatigue Gastrointestinal: positive for nausea   PHYSICAL EXAMINATION: General appearance: alert, cooperative and no  distress Head: Normocephalic, without obvious abnormality, atraumatic Neck: no adenopathy, no JVD, supple, symmetrical, trachea midline and thyroid not enlarged, symmetric, no tenderness/mass/nodules Lymph nodes: Cervical, supraclavicular, and axillary nodes normal. Resp: clear to auscultation bilaterally Back: symmetric, no curvature. ROM normal. No CVA tenderness. Cardio: regular rate and rhythm, S1, S2 normal, no murmur, click, rub or gallop GI: soft, non-tender; bowel sounds normal; no masses,  no organomegaly Extremities: extremities normal, atraumatic, no cyanosis or edema  ECOG PERFORMANCE STATUS: 1 - Symptomatic but completely ambulatory  Blood pressure (!) 158/60, pulse 91, temperature 97.7 F (36.5 C), temperature source Tympanic, resp. rate 13, height 5' 2" (1.575 m), weight 148 lb 14.4 oz (67.5 kg), SpO2 98 %.  LABORATORY DATA: Lab Results  Component Value Date   WBC 10.6 (H) 02/22/2020   HGB 11.0 (L) 02/22/2020   HCT 35.0 (L) 02/22/2020   MCV 97.0 02/22/2020   PLT 412 (H) 02/22/2020      Chemistry       Component Value Date/Time   NA 138 02/01/2020 1037   NA 142 12/24/2016 0816   K 4.1 02/01/2020 1037   K 4.4 12/24/2016 0816   CL 105 02/01/2020 1037   CL 106 06/30/2012 1004   CO2 25 02/01/2020 1037   CO2 28 12/24/2016 0816   BUN 10 02/01/2020 1037   BUN 15.8 12/24/2016 0816   CREATININE 0.86 02/01/2020 1037   CREATININE 1.1 12/24/2016 0816      Component Value Date/Time   CALCIUM 9.1 02/01/2020 1037   CALCIUM 9.6 12/24/2016 0816   ALKPHOS 71 02/01/2020 1037   ALKPHOS 80 12/24/2016 0816   AST 23 02/01/2020 1037   AST 20 12/24/2016 0816   ALT 11 02/01/2020 1037   ALT 11 12/24/2016 0816   BILITOT 0.5 02/01/2020 1037   BILITOT 0.34 12/24/2016 0816       RADIOGRAPHIC STUDIES: No results found.  ASSESSMENT AND PLAN: This is a very pleasant 70 years old white female with metastatic non-small cell lung cancer diagnosed in October 2007 status post concurrent chemoradiation followed by consolidation chemotherapy and she is currently on maintenance treatment with single agent Alimta status post 216 cycles. The patient has been tolerating her treatment well with no concerning adverse effects. I recommended for her to proceed with cycle #217 today as planned. For the nausea, she will continue on Zofran on as-needed basis. For the pain management, I will give her a refill of MS Contin today. The patient will come back for follow-up visit in 3 weeks for evaluation before the next cycle of her treatment. She was advised to call immediately if she has any other concerning symptoms in the interval. The patient voices understanding of current disease status and treatment options and is in agreement with the current care plan. All questions were answered. The patient knows to call the clinic with any problems, questions or concerns. We can certainly see the patient much sooner if necessary.  Disclaimer: This note was dictated with voice recognition software. Similar sounding words can  inadvertently be transcribed and may not be corrected upon review.

## 2020-02-23 ENCOUNTER — Other Ambulatory Visit: Payer: Self-pay | Admitting: Family Medicine

## 2020-02-23 ENCOUNTER — Telehealth: Payer: Self-pay | Admitting: Internal Medicine

## 2020-02-23 NOTE — Telephone Encounter (Signed)
Scheduled appts per 2/15 los. Pt to refer to mychart for appts.

## 2020-02-29 ENCOUNTER — Other Ambulatory Visit: Payer: Self-pay | Admitting: Internal Medicine

## 2020-02-29 DIAGNOSIS — C7931 Secondary malignant neoplasm of brain: Secondary | ICD-10-CM

## 2020-02-29 DIAGNOSIS — C3411 Malignant neoplasm of upper lobe, right bronchus or lung: Secondary | ICD-10-CM

## 2020-03-02 ENCOUNTER — Other Ambulatory Visit (INDEPENDENT_AMBULATORY_CARE_PROVIDER_SITE_OTHER): Payer: Medicare (Managed Care)

## 2020-03-02 ENCOUNTER — Other Ambulatory Visit: Payer: Self-pay

## 2020-03-02 DIAGNOSIS — E119 Type 2 diabetes mellitus without complications: Secondary | ICD-10-CM | POA: Diagnosis not present

## 2020-03-02 LAB — COMPREHENSIVE METABOLIC PANEL
ALT: 19 U/L (ref 0–35)
AST: 26 U/L (ref 0–37)
Albumin: 3.9 g/dL (ref 3.5–5.2)
Alkaline Phosphatase: 72 U/L (ref 39–117)
BUN: 16 mg/dL (ref 6–23)
CO2: 27 mEq/L (ref 19–32)
Calcium: 9.8 mg/dL (ref 8.4–10.5)
Chloride: 101 mEq/L (ref 96–112)
Creatinine, Ser: 0.82 mg/dL (ref 0.40–1.20)
GFR: 73.92 mL/min (ref >60.00)
Glucose, Bld: 146 mg/dL — ABNORMAL HIGH (ref 70–99)
Potassium: 4 mEq/L (ref 3.5–5.1)
Sodium: 138 mEq/L (ref 135–145)
Total Bilirubin: 0.6 mg/dL (ref 0.2–1.2)
Total Protein: 7.4 g/dL (ref 6.0–8.3)

## 2020-03-02 LAB — LIPID PANEL
Cholesterol: 139 mg/dL (ref 0–200)
HDL: 47.7 mg/dL (ref >39.00)
LDL Cholesterol: 71 mg/dL (ref 0–99)
NonHDL: 91.51
Total CHOL/HDL Ratio: 3
Triglycerides: 103 mg/dL (ref 0.0–149.0)
VLDL: 20.6 mg/dL (ref 0.0–40.0)

## 2020-03-02 LAB — HEMOGLOBIN A1C: Hgb A1c MFr Bld: 5.9 % (ref 4.6–6.5)

## 2020-03-02 NOTE — Progress Notes (Signed)
No critical labs need to be addressed urgently. We will discuss labs in detail at upcoming office visit.   

## 2020-03-09 ENCOUNTER — Ambulatory Visit (INDEPENDENT_AMBULATORY_CARE_PROVIDER_SITE_OTHER): Payer: Medicare (Managed Care) | Admitting: Family Medicine

## 2020-03-09 ENCOUNTER — Other Ambulatory Visit: Payer: Self-pay

## 2020-03-09 ENCOUNTER — Encounter: Payer: Self-pay | Admitting: Family Medicine

## 2020-03-09 VITALS — BP 144/60 | HR 87 | Temp 98.0°F | Ht 62.75 in | Wt 150.2 lb

## 2020-03-09 DIAGNOSIS — C3490 Malignant neoplasm of unspecified part of unspecified bronchus or lung: Secondary | ICD-10-CM

## 2020-03-09 DIAGNOSIS — E119 Type 2 diabetes mellitus without complications: Secondary | ICD-10-CM

## 2020-03-09 DIAGNOSIS — E785 Hyperlipidemia, unspecified: Secondary | ICD-10-CM

## 2020-03-09 DIAGNOSIS — I700 Atherosclerosis of aorta: Secondary | ICD-10-CM

## 2020-03-09 DIAGNOSIS — E1159 Type 2 diabetes mellitus with other circulatory complications: Secondary | ICD-10-CM

## 2020-03-09 DIAGNOSIS — H6121 Impacted cerumen, right ear: Secondary | ICD-10-CM

## 2020-03-09 DIAGNOSIS — J449 Chronic obstructive pulmonary disease, unspecified: Secondary | ICD-10-CM

## 2020-03-09 DIAGNOSIS — I152 Hypertension secondary to endocrine disorders: Secondary | ICD-10-CM

## 2020-03-09 DIAGNOSIS — E1169 Type 2 diabetes mellitus with other specified complication: Secondary | ICD-10-CM

## 2020-03-09 MED ORDER — LISINOPRIL-HYDROCHLOROTHIAZIDE 20-12.5 MG PO TABS: 2.0000 | ORAL_TABLET | Freq: Every day | ORAL | 3 refills | Status: DC

## 2020-03-09 NOTE — Assessment & Plan Note (Signed)
Diet controlled Encouraged exercise, weight loss, healthy eating habits.

## 2020-03-09 NOTE — Patient Instructions (Addendum)
Stop lisinopril change to lisinopril with HCTZ 20/12.5 mg  TWO tabs daily.  Continue amlodipine.  Follow BP blood pressure at home.. send measurements in next couple of week.

## 2020-03-09 NOTE — Progress Notes (Signed)
[name] OFFICE PROGRESS NOTE  [name], MD [place] [id number]  DIAGNOSIS: Metastatic non-small cell lung cancer initially diagnosed as locally advanced stage IIIB with a right Pancoast tumor involving the vertebral body as well as prominent canal invasion with spinal cord compression in October 2007. The patient also has metastatic disease to the brain in April 2008.  PRIOR THERAPY: 1. Status post concurrent chemoradiation with weekly carboplatin and paclitaxel, last dose was given November 18, 2005. 2. 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 3. Status post gamma knife radiotherapy to a solitary brain lesion located in the superior frontal area of the brain at [facility] in April of 2008. 4. Status post palliative radiotherapy to the lateral abdominal wall metastatic lesion under the care of Dr. Lisbeth Renshaw, completed March of 2009. 5. Status post 6 cycles of systemic chemotherapy with carboplatin and Alimta. Last dose was given July 26, 2007 with disease stabilization. 6. Gamma knife stereotactic radiotherapy to 2 brain lesions one involving the right frontal dural based as well as right parietal lesion performed on 05/07/2012 under the care of Dr. Vallarie Mare at [facility].   CURRENT THERAPY: Maintenance systemic chemotherapy with Alimta 500 MG/M2 every 3 weeks, status post 217cycles.  INTERVAL HISTORY: Danielle [name] 70 y.o. female returns to the clinic for a follow up visitaccompanied by her sister.The patient is feeling well today without any concerning complaints. She is pleased that her bilateral lower extremity swelling has improved. She recently had a follow up appointment with primary care who adjusted her BP medications. She states she now is on a "fluid pill". She states she had been urinating a lot previously but is now urinating less. She lost  about 8 lbs since her last appointment which may partially be fluid weight.  Otherwise, she is tolerating her treatment with maintenance Alimta well without any adverse side effects except for occasional nausea/vomiting. She takes zofran for nausea and compazine if needed.She denies any feveror chills.She reportedly has baseline night sweats for several years, which are unchanged.She denies any hemoptysis, cough,chest pain. She reports her baseline shortness of breath with exertion.She denies any diarrhea or constipation. She denies any headaches or visual changes.She is here today for evaluationbeforestarting cycle #218.     MEDICAL HISTORY: Past Medical History:  Diagnosis Date  . Anemia   . 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 Carcinomas, basal cell 01/22/2019   two places on face  . CVA (cerebral vascular accident) ([place]) 04/2014   pt states had 2 cva's within 2 wks  . DJD (degenerative joint disease), cervical   . Fibromyalgia   . H/O: pneumonia   . History of tobacco abuse quit 10/08   on nicotine patch  . Hypertension   . Hypokalemia   . Lung cancer ([place]) dx'd 09/2005   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 had chemo and radiation for lung ca  . Thrush 12/11/2010    ALLERGIES:  is allergic to contrast media [iodinated diagnostic agents], iohexol, sulfa drugs cross reactors, and co-trimoxazole injection [sulfamethoxazole-trimethoprim].  MEDICATIONS:  Current Outpatient Medications  Medication Sig Dispense Refill  . albuterol (VENTOLIN HFA) 108 (90 Base) MCG/ACT inhaler TAKE 2 PUFFS BY MOUTH EVERY 6 HOURS AS NEEDED FOR WHEEZE OR SHORTNESS OF BREATH 18 each 2  . alendronate (FOSAMAX) 70 MG tablet Take 1 tablet (70 mg total) by mouth every 7 (seven) days. Take with  a full glass of water on an empty stomach. 4 tablet 11  . amLODipine (NORVASC) 10 MG tablet TAKE 1 TABLET BY MOUTH EVERY DAY 90 tablet 2  . aspirin 325 MG EC tablet TAKE 1 TABLET BY  MOUTH EVERY DAY 90 tablet 0  . atorvastatin (LIPITOR) 40 MG tablet TAKE 1 TABLET BY MOUTH EVERY DAY AT 6 PM 90 tablet 1  . Cholecalciferol (VITAMIN D3) 50 MCG (2000 UT) TABS Take by mouth.    . diphenhydrAMINE (BENADRYL) 25 MG tablet Take 1 tablet (25 mg total) by mouth as directed. 2 hours before CT scan. 5 tablet 0  . FeFum-FePoly-FA-B Cmp-C-Biot (INTEGRA PLUS) CAPS TAKE 1 CAPSULE BY MOUTH EVERY DAY IN THE MORNING 90 capsule 3  . fluticasone (FLONASE) 50 MCG/ACT nasal spray Place 1-2 sprays into both nostrils daily as needed. 16 g 5  . folic acid (FOLVITE) 1 MG tablet TAKE 1 TABLET BY MOUTH EVERY DAY 90 tablet 1  . furosemide (LASIX) 20 MG tablet TAKE 1 TABLET BY MOUTH EVERY DAY AS NEEDED FOR SWELLING 20 tablet 1  . lidocaine-prilocaine (EMLA) cream Apply to the Port-A-Cath site 30-60-minute before treatment. 30 g 0  . lisinopril-hydrochlorothiazide (ZESTORETIC) 20-12.5 MG tablet Take 2 tablets by mouth daily. 90 tablet 3  . morphine (MS CONTIN) 60 MG 12 hr tablet Take 1 tablet (60 mg total) by mouth 2 (two) times daily. 60 tablet 0  . morphine (MSIR) 30 MG tablet Take 1 tablet (30 mg total) by mouth every 4 (four) hours as needed (breakthrough pain). 60 tablet 0  . Olopatadine HCl 0.2 % SOLN INSTILL 1 DROP INTO AFFECTED EYE(S) EVERY DAY 2.5 mL 1  . ondansetron (ZOFRAN) 8 MG tablet Take 1 tablet (8 mg total) by mouth every 8 (eight) hours as needed for nausea or vomiting. 20 tablet 0  . prochlorperazine (COMPAZINE) 10 MG tablet Take 1 tablet (10 mg total) by mouth every 6 (six) hours as needed. 30 tablet 2  . triamcinolone (KENALOG) 0.025 % ointment Apply 1 application topically 2 (two) times daily. 30 g 0  . umeclidinium bromide (INCRUSE ELLIPTA) 62.5 MCG/INH AEPB Inhale 1 puff into the lungs daily. 1 each 11  . predniSONE (DELTASONE) 50 MG tablet 1 tablet by mouth 13 hours ,7 hours and 1 hour  before CT scan 3 tablet 4   No current facility-administered medications for this visit.     SURGICAL HISTORY:  Past Surgical History:  Procedure Laterality Date  . CERVICAL LAMINECTOMY  1995  . KNEE SURGERY  1990   Left x 2  . 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 LOOP RECORDER IMPLANT N/A 04/19/2014   Procedure: LOOP RECORDER IMPLANT;  Surgeon: Thompson Grayer, MD;  Location: [facility] CATH LAB;  Service: Cardiovascular;  Laterality: N/A;  . OTHER SURGICAL HISTORY  2008   Gamma knife surgery to remove brain met   . PORTACATH PLACEMENT  -[name]   TIP IN CAVOATRIAL JUNCTION  . TEE WITHOUT CARDIOVERSION N/A 04/19/2014   Procedure: TRANSESOPHAGEAL ECHOCARDIOGRAM (TEE);  Surgeon: Larey Dresser, MD;  Location: [facility];  Service: Cardiovascular;  Laterality: N/A;    REVIEW OF SYSTEMS:   Review of Systems Constitutional: Negative for appetite change, chills, fatigue, fever and unexpected weight change.  HENT:Negative for mouth sores, nosebleeds, sore throat and trouble swallowing.  Eyes: Negative for eye problems and icterus.  Respiratory:Positive for baseline shortness of breath with exertion.Negative for cough, hemoptysis, and wheezing.  Cardiovascular:Positive for improved lower extremity swelling.Negative for chest pain. Gastrointestinal:Positive for occasional nausea and vomiting.Negative for abdominal pain, constipation, and diarrhea. Genitourinary: Positive for decreased urination. Negative for bladder incontinence, difficulty urinating, dysuria, frequency and hematuria.  Musculoskeletal: Negative for back pain, gait problem, neck pain and neck stiffness.  Skin:Positive for several scars on her skin.Negative for itching and rash.  Neurological: Negative for dizziness, extremity weakness, gait problem, headaches, light-headedness and seizures.  Hematological: Negative for adenopathy. Does not bruise/bleed easily.  Psychiatric/Behavioral: Negative for confusion, depression and sleep disturbance. The patient is not nervous/anxious.     PHYSICAL EXAMINATION:  Blood pressure 127/85, pulse 67, temperature (!) 97.1 F (36.2 C), temperature source Tympanic, resp. rate 16, height 5' 2.75" (1.594 m), weight 142 lb 11.2 oz (64.7 kg), SpO2 99 %.  ECOG PERFORMANCE STATUS: 1 - Symptomatic but completely ambulatory  Physical Exam  Constitutional: Oriented to person, place, and time and well-developed, well-nourished, and in no distress.  HENT:  Head: Normocephalic and atraumatic.  Mouth/Throat: Oropharynx is clear and moist. No oropharyngeal exudate.  Eyes: Conjunctivae are normal. Right eye exhibits no discharge. Left eye exhibits no discharge. No scleral icterus.  Neck: Normal range of motion. Neck supple.  Cardiovascular: Normal rate, regular rhythm, normal heart sounds and intact distal pulses.  Pulmonary/Chest: Effort normal and breath soundsquiet in all lung fields.No respiratory distress. No wheezes. No rales.  Abdominal: Soft. Bowel sounds are normal. Exhibits no distension and no mass. There is no tenderness.  Musculoskeletal: Positive for improved bilateral lower extremity edema.Normal range of motion.  Lymphadenopathy:  No cervical adenopathy.  Neurological: Alert and oriented to person, place, and time. Exhibits normal muscle tone. Gait normal. Coordination normal. Ambulates with a cane. Skin: Skin is warm and dry. No rash noted. Not diaphoretic. No erythema. No pallor.  Psychiatric: Mood, memory and judgment normal.  Vitals reviewed.  LABORATORY DATA: Lab Results  Component Value Date   WBC 16.7 (H) 03/14/2020   HGB 12.0 03/14/2020   HCT 37.5 03/14/2020   MCV 95.4 03/14/2020   PLT 513 (H) 03/14/2020      Chemistry      Component Value Date/Time   NA 132 (L) 03/14/2020 1038   NA 142 12/24/2016 0816   K 3.8 03/14/2020 1038   K 4.4 12/24/2016 0816   CL 96 (L) 03/14/2020 1038   CL 106 06/30/2012 1004   CO2 27 03/14/2020 1038   CO2 28 12/24/2016 0816   BUN 48 (H) 03/14/2020 1038   BUN 15.8  12/24/2016 0816   CREATININE 2.65 (H) 03/14/2020 1038   CREATININE 0.83 02/22/2020 0954   CREATININE 1.1 12/24/2016 0816      Component Value Date/Time   CALCIUM 9.2 03/14/2020 1038   CALCIUM 9.6 12/24/2016 0816   ALKPHOS 73 03/14/2020 1038   ALKPHOS 80 12/24/2016 0816   AST 21 03/14/2020 1038   AST 20 02/22/2020 0954   AST 20 12/24/2016 0816   ALT 14 03/14/2020 1038   ALT 11 02/22/2020 0954   ALT 11 12/24/2016 0816   BILITOT 0.3 03/14/2020 1038   BILITOT 0.3 02/22/2020 0954   BILITOT 0.34 12/24/2016 0816       RADIOGRAPHIC STUDIES:  No results found.   ASSESSMENT/PLAN:  This isa very pleasant 70 year old Caucasian female with metastatic non-small cell lung cancer who was initially diagnosed in October 2007. She is status post current chemoradiation followed by consolidation chemotherapy.  She is currently undergoing weeklymaintainancechemotherapy with single agent Alimta 500 mg/m. She is status post217cycles.She  is tolerating treatment well without any concerning complaintsexcept mild nausea/vomiting.  The patient was seen with Dr. Julien Nordmann today. Labs were reviewed. Her creatinine has increased quite a bit from her baseline. Her creatinine is typically ~0.8 and is 2.65 today. Likely secondary to her new antihypertensive medications. Due to the patient's elevated creatinine, she will not be able to receive chemotherapy today. We will delay her treatment by 1 week. Instead, she will received 1 L of fluid today. She was encouraged to increase her hydration at home.   I will arrange for repeat labs, flush, return visit, and infusion next week. We will recheck her CMP at that time. She was instructed to hold her new blood pressure medications for now. I will reach out to her PCP about their recommendations moving forward in the future with BP management.   We will arrange for her next restaging CT scan of the chest, abdomen, and pelvis at her next appointment when she  proceeds with cycle #218 of treatment. I did sent her pre-medications with prednisone to the pharmacy as she will likely have a restaging CT scan in the near future.   The patient was advised to call immediately if she has any concerning symptoms in the interval. The patient voices understanding of current disease status and treatment options and is in agreement with the current care plan. All questions were answered. The patient knows to call the clinic with any problems, questions or concerns. We can certainly see the patient much sooner if necessary  No orders of the defined types were placed in this encounter.   I spent 30-39 minutes in this encounter.   Kylil Swopes L Maytte Jacot, PA-C 03/14/20  ADDENDUM: Hematology/Oncology Attending: I had a face-to-face encounter with the patient today.  I reviewed her records and recommended her care plan.  This is a very pleasant 70 years old white female with metastatic non-small cell lung cancer, diagnosed in October 2007 initially treated with a course of concurrent chemoradiation.  The patient was then treated with systemic chemotherapy with Alimta 500 mg/M2 status post 217 cycles.  The patient has been tolerating this treatment well with no concerning adverse effects. She was seen recently by her primary care physician and because of hypertension her blood pressure medication was changed to lisinopril with HCTZ and the patient has been on treatment with Lasix for edema of the lower extremities.  She had sudden increase in her serum creatinine from 0.8 to 2.65 and short period.  I had a lengthy discussion with the patient today about her condition and treatment options.  I strongly recommend for the patient to discontinue her current blood pressure medication with lisinopril and HCTZ in addition to Lasix.  She can continue her current treatment with Norvasc for now. We will arrange for the patient to receive 1 L of normal saline in the clinic today. I  will delay the start of cycle #218 until next week depending on the improvement of her serum creatinine. The patient will have repeat CT scan of the chest, abdomen and pelvis after the next cycle of her treatment. For pain management she will continue with her current pain medication with MS Contin as well as morphine sulfate. She was advised to call immediately if she has any other concerning symptoms in the interval. The total time spent in the appointment was 44 minutes.  Disclaimer: This note was dictated with voice recognition software. Similar sounding words can inadvertently be transcribed and may be missed upon review. Julien Nordmann  Fanny Bien, MD 03/14/20

## 2020-03-09 NOTE — Assessment & Plan Note (Signed)
On statin.

## 2020-03-09 NOTE — Assessment & Plan Note (Signed)
Stable, chronic.  Continue current medication.    On amlodipine  10 mg daily and lisinopril 20 mg daily.

## 2020-03-09 NOTE — Assessment & Plan Note (Signed)
Stable control on  Albuterol prn.

## 2020-03-09 NOTE — Progress Notes (Signed)
Patient ID: Danielle [name], female    DOB: [birth year], 70 y.o.   MRN: [id number]  This visit was conducted in person.  BP (!) 144/60   Pulse 87   Temp 98 F (36.7 C) (Temporal)   Ht 5' 2.75" (1.594 m)   Wt 150 lb 4 oz (68.2 kg)   SpO2 96%   BMI 26.83 kg/m    CC:  Chief Complaint  Patient presents with  . Diabetes    Subjective:   HPI: Danielle [name] is a 70 y.o. female presenting on 03/09/2020 for Diabetes   Right ear decreased hearing and feels full.. no pain. Ongoing couple of days.  Sinus congestion chronically. Using  Afrin.  Flonase did not help.   On treat 218 of chemo therapy for metastatic lung cancer.   Diabetes:  Diet controlled. Lab Results  Component Value Date   HGBA1C 5.9 03/02/2020  Using medications without difficulties: Hypoglycemic episodes: Hyperglycemic episodes: Feet problems: Blood Sugars averaging: eye exam within last year:  Hypertension:  Borderline control on amlodipine 10 mg  and lisinopril 40 mg. BP Readings from Last 3 Encounters:  03/09/20 (!) 144/60  02/22/20 (!) 158/60  02/01/20 (!) 141/61  Using medication without problems or lightheadedness:  none Chest pain with exertion: none Edema:none Short of breath: none Average home BPs: Other issues:  Elevated Cholesterol:  Good control on  Atorvastatin 40 mg daily Lab Results  Component Value Date   CHOL 139 03/02/2020   HDL 47.70 03/02/2020   LDLCALC 71 03/02/2020   LDLDIRECT 105.5 08/06/2013   TRIG 103.0 03/02/2020   CHOLHDL 3 03/02/2020  Using medications without problems: Muscle aches:  Diet compliance: Exercise: walking with cane Other complaints:        Relevant past medical, surgical, family and social history reviewed and updated as indicated. Interim medical history since our last visit reviewed. Allergies and medications reviewed and updated. Outpatient Medications Prior to Visit  Medication Sig Dispense Refill  . albuterol (VENTOLIN HFA) 108 (90  Base) MCG/ACT inhaler TAKE 2 PUFFS BY MOUTH EVERY 6 HOURS AS NEEDED FOR WHEEZE OR SHORTNESS OF BREATH 18 each 2  . alendronate (FOSAMAX) 70 MG tablet Take 1 tablet (70 mg total) by mouth every 7 (seven) days. Take with a full glass of water on an empty stomach. 4 tablet 11  . amLODipine (NORVASC) 10 MG tablet TAKE 1 TABLET BY MOUTH EVERY DAY 90 tablet 2  . aspirin 325 MG EC tablet TAKE 1 TABLET BY MOUTH EVERY DAY 90 tablet 0  . atorvastatin (LIPITOR) 40 MG tablet TAKE 1 TABLET BY MOUTH EVERY DAY AT 6 PM 90 tablet 1  . Cholecalciferol (VITAMIN D3) 50 MCG (2000 UT) TABS Take by mouth.    . diphenhydrAMINE (BENADRYL) 25 MG tablet Take 1 tablet (25 mg total) by mouth as directed. 2 hours before CT scan. 5 tablet 0  . FeFum-FePoly-FA-B Cmp-C-Biot (INTEGRA PLUS) CAPS TAKE 1 CAPSULE BY MOUTH EVERY DAY IN THE MORNING 90 capsule 3  . fluticasone (FLONASE) 50 MCG/ACT nasal spray Place 1-2 sprays into both nostrils daily as needed. 16 g 5  . folic acid (FOLVITE) 1 MG tablet TAKE 1 TABLET BY MOUTH EVERY DAY 90 tablet 1  . furosemide (LASIX) 20 MG tablet TAKE 1 TABLET BY MOUTH EVERY DAY AS NEEDED FOR SWELLING 20 tablet 1  . lidocaine-prilocaine (EMLA) cream Apply to the Port-A-Cath site 30-60-minute before treatment. 30 g 0  . lisinopril (ZESTRIL) 20 MG tablet TAKE  2 TABLETS BY MOUTH EVERY DAY 180 tablet 2  . morphine (MS CONTIN) 60 MG 12 hr tablet Take 1 tablet (60 mg total) by mouth 2 (two) times daily. 60 tablet 0  . morphine (MSIR) 30 MG tablet Take 1 tablet (30 mg total) by mouth every 4 (four) hours as needed (breakthrough pain). 60 tablet 0  . Olopatadine HCl 0.2 % SOLN INSTILL 1 DROP INTO AFFECTED EYE(S) EVERY DAY 2.5 mL 1  . ondansetron (ZOFRAN) 8 MG tablet Take 1 tablet (8 mg total) by mouth every 8 (eight) hours as needed for nausea or vomiting. 20 tablet 0  . predniSONE (DELTASONE) 50 MG tablet 1 tablet by mouth 13 hours ,7 hours and 1 hour  before CT scan (Patient taking differently: 1 tablet by  mouth 13 hours ,7 hours and 1 hour  before CT scan) 3 tablet 4  . prochlorperazine (COMPAZINE) 10 MG tablet Take 1 tablet (10 mg total) by mouth every 6 (six) hours as needed. 30 tablet 2  . triamcinolone (KENALOG) 0.025 % ointment Apply 1 application topically 2 (two) times daily. 30 g 0  . umeclidinium bromide (INCRUSE ELLIPTA) 62.5 MCG/INH AEPB Inhale 1 puff into the lungs daily. 1 each 11   No facility-administered medications prior to visit.     Per HPI unless specifically indicated in ROS section below Review of Systems  Constitutional: Negative for fatigue and fever.  HENT: Negative for ear pain.   Eyes: Negative for pain.  Respiratory: Negative for chest tightness and shortness of breath.   Cardiovascular: Negative for chest pain, palpitations and leg swelling.  Gastrointestinal: Negative for abdominal pain.  Genitourinary: Negative for dysuria.   Objective:  BP (!) 144/60   Pulse 87   Temp 98 F (36.7 C) (Temporal)   Ht 5' 2.75" (1.594 m)   Wt 150 lb 4 oz (68.2 kg)   SpO2 96%   BMI 26.83 kg/m   Wt Readings from Last 3 Encounters:  03/09/20 150 lb 4 oz (68.2 kg)  02/22/20 148 lb 14.4 oz (67.5 kg)  02/01/20 147 lb 2 oz (66.7 kg)      Physical Exam Constitutional:      General: She is not in acute distress.Vital signs are normal.     Appearance: Normal appearance. She is well-developed and well-nourished. She is not ill-appearing or toxic-appearing.  HENT:     Head: Normocephalic.     Right Ear: Hearing, tympanic membrane, ear canal and external ear normal. There is impacted cerumen. Tympanic membrane is not erythematous, retracted or bulging.     Left Ear: Hearing, tympanic membrane, ear canal and external ear normal. Tympanic membrane is not erythematous, retracted or bulging.     Ears:     Comments:  She will treat at home with OTC drops.. refused irrigation today.    Nose: No mucosal edema or rhinorrhea.     Right Sinus: No maxillary sinus tenderness or frontal  sinus tenderness.     Left Sinus: No maxillary sinus tenderness or frontal sinus tenderness.     Mouth/Throat:     Mouth: Oropharynx is clear and moist and mucous membranes are normal.     Pharynx: Uvula midline.  Eyes:     General: Lids are normal. Lids are everted, no foreign bodies appreciated.     Extraocular Movements: EOM normal.     Conjunctiva/sclera: Conjunctivae normal.     Pupils: Pupils are equal, round, and reactive to light.  Neck:     Thyroid:  No thyroid mass or thyromegaly.     Vascular: No carotid bruit.     Trachea: Trachea normal.  Cardiovascular:     Rate and Rhythm: Normal rate and regular rhythm.     Pulses: Normal pulses and intact distal pulses.     Heart sounds: Normal heart sounds, S1 normal and S2 normal. No murmur heard. No friction rub. No gallop.   Pulmonary:     Effort: Pulmonary effort is normal. No tachypnea or respiratory distress.     Breath sounds: Normal breath sounds. No decreased breath sounds, wheezing, rhonchi or rales.  Abdominal:     General: Bowel sounds are normal.     Palpations: Abdomen is soft.     Tenderness: There is no abdominal tenderness.  Musculoskeletal:     Cervical back: Normal range of motion and neck supple.  Skin:    General: Skin is warm, dry and intact.     Findings: No rash.  Neurological:     Mental Status: She is alert.  Psychiatric:        Mood and Affect: Mood is not anxious or depressed.        Speech: Speech normal.        Behavior: Behavior normal. Behavior is cooperative.        Thought Content: Thought content normal.        Cognition and Memory: Cognition and memory normal.        Judgment: Judgment normal.       Results for orders placed or performed in visit on 03/02/20  Hemoglobin A1c  Result Value Ref Range   Hgb A1c MFr Bld 5.9 4.6 - 6.5 %  Comprehensive metabolic panel  Result Value Ref Range   Sodium 138 135 - 145 mEq/L   Potassium 4.0 3.5 - 5.1 mEq/L   Chloride 101 96 - 112 mEq/L   CO2  27 19 - 32 mEq/L   Glucose, Bld 146 (H) 70 - 99 mg/dL   BUN 16 6 - 23 mg/dL   Creatinine, Ser 0.82 0.40 - 1.20 mg/dL   Total Bilirubin 0.6 0.2 - 1.2 mg/dL   Alkaline Phosphatase 72 39 - 117 U/L   AST 26 0 - 37 U/L   ALT 19 0 - 35 U/L   Total Protein 7.4 6.0 - 8.3 g/dL   Albumin 3.9 3.5 - 5.2 g/dL   GFR 73.92 >60.00 mL/min   Calcium 9.8 8.4 - 10.5 mg/dL  Lipid panel  Result Value Ref Range   Cholesterol 139 0 - 200 mg/dL   Triglycerides 103.0 0.0 - 149.0 mg/dL   HDL 47.70 >39.00 mg/dL   VLDL 20.6 0.0 - 40.0 mg/dL   LDL Cholesterol 71 0 - 99 mg/dL   Total CHOL/HDL Ratio 3    NonHDL 91.51    *Note: Due to a large number of results and/or encounters for the requested time period, some results have not been displayed. A complete set of results can be found in Results Review.    This visit occurred during the SARS-CoV-2 public health emergency.  Safety protocols were in place, including screening questions prior to the visit, additional usage of staff PPE, and extensive cleaning of exam room while observing appropriate contact time as indicated for disinfecting solutions.   COVID 19 screen:  No recent travel or known exposure to COVID19 The patient denies respiratory symptoms of COVID 19 at this time. The importance of social distancing was discussed today.   Assessment and Plan  Problem List Items Addressed This Visit    Atherosclerosis of aorta ([place])    On statin.      Relevant Medications   lisinopril-hydrochlorothiazide (ZESTORETIC) 20-12.5 MG tablet   COPD, moderate (HCC)    Stable control on  Albuterol prn.      Diabetes mellitus with no complication (HCC) - Primary (Chronic)    Diet controlled Encouraged exercise, weight loss, healthy eating habits.       Relevant Medications   lisinopril-hydrochlorothiazide (ZESTORETIC) 20-12.5 MG tablet   Hearing loss of right ear due to cerumen impaction   Hyperlipidemia associated with type 2 diabetes mellitus (HCC)  (Chronic)    Stable, chronic.  Continue current medication.    atorvastatin 40 mg daily.      Relevant Medications   lisinopril-hydrochlorothiazide (ZESTORETIC) 20-12.5 MG tablet   Hypertension associated with diabetes (HCC) (Chronic)    Stable, chronic.  Continue current medication.    On amlodipine  10 mg daily and lisinopril 20 mg daily.      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 Followed by [facility].          [name], MD

## 2020-03-09 NOTE — Assessment & Plan Note (Signed)
ON chemotherapy lifelong. Followed by [facility].

## 2020-03-09 NOTE — Assessment & Plan Note (Signed)
Stable, chronic.  Continue current medication.    atorvastatin 40 mg daily.

## 2020-03-14 ENCOUNTER — Inpatient Hospital Stay: Payer: Medicare (Managed Care) | Attending: Physician Assistant

## 2020-03-14 ENCOUNTER — Other Ambulatory Visit: Payer: Self-pay

## 2020-03-14 ENCOUNTER — Inpatient Hospital Stay: Payer: Medicare (Managed Care)

## 2020-03-14 ENCOUNTER — Inpatient Hospital Stay (HOSPITAL_BASED_OUTPATIENT_CLINIC_OR_DEPARTMENT_OTHER): Payer: Medicare (Managed Care) | Admitting: Physician Assistant

## 2020-03-14 VITALS — BP 127/85 | HR 67 | Temp 97.1°F | Resp 16 | Ht 62.75 in | Wt 142.7 lb

## 2020-03-14 DIAGNOSIS — Z79899 Other long term (current) drug therapy: Secondary | ICD-10-CM | POA: Insufficient documentation

## 2020-03-14 DIAGNOSIS — N179 Acute kidney failure, unspecified: Secondary | ICD-10-CM | POA: Diagnosis not present

## 2020-03-14 DIAGNOSIS — Z85118 Personal history of other malignant neoplasm of bronchus and lung: Secondary | ICD-10-CM

## 2020-03-14 DIAGNOSIS — C3491 Malignant neoplasm of unspecified part of right bronchus or lung: Secondary | ICD-10-CM | POA: Insufficient documentation

## 2020-03-14 DIAGNOSIS — C3490 Malignant neoplasm of unspecified part of unspecified bronchus or lung: Secondary | ICD-10-CM

## 2020-03-14 DIAGNOSIS — Z5111 Encounter for antineoplastic chemotherapy: Secondary | ICD-10-CM | POA: Diagnosis present

## 2020-03-14 DIAGNOSIS — E860 Dehydration: Secondary | ICD-10-CM | POA: Diagnosis not present

## 2020-03-14 DIAGNOSIS — Z923 Personal history of irradiation: Secondary | ICD-10-CM | POA: Insufficient documentation

## 2020-03-14 DIAGNOSIS — R600 Localized edema: Secondary | ICD-10-CM | POA: Diagnosis not present

## 2020-03-14 DIAGNOSIS — Z91041 Radiographic dye allergy status: Secondary | ICD-10-CM

## 2020-03-14 DIAGNOSIS — R7989 Other specified abnormal findings of blood chemistry: Secondary | ICD-10-CM

## 2020-03-14 DIAGNOSIS — I10 Essential (primary) hypertension: Secondary | ICD-10-CM | POA: Insufficient documentation

## 2020-03-14 DIAGNOSIS — Z7982 Long term (current) use of aspirin: Secondary | ICD-10-CM | POA: Diagnosis not present

## 2020-03-14 DIAGNOSIS — M47812 Spondylosis without myelopathy or radiculopathy, cervical region: Secondary | ICD-10-CM | POA: Insufficient documentation

## 2020-03-14 DIAGNOSIS — M797 Fibromyalgia: Secondary | ICD-10-CM | POA: Insufficient documentation

## 2020-03-14 DIAGNOSIS — C3411 Malignant neoplasm of upper lobe, right bronchus or lung: Secondary | ICD-10-CM

## 2020-03-14 DIAGNOSIS — R112 Nausea with vomiting, unspecified: Secondary | ICD-10-CM | POA: Insufficient documentation

## 2020-03-14 DIAGNOSIS — Z95828 Presence of other vascular implants and grafts: Secondary | ICD-10-CM

## 2020-03-14 DIAGNOSIS — Z8673 Personal history of transient ischemic attack (TIA), and cerebral infarction without residual deficits: Secondary | ICD-10-CM | POA: Diagnosis not present

## 2020-03-14 DIAGNOSIS — C7931 Secondary malignant neoplasm of brain: Secondary | ICD-10-CM | POA: Insufficient documentation

## 2020-03-14 LAB — CBC WITH DIFFERENTIAL/PLATELET
Abs Immature Granulocytes: 0.09 10*3/uL — ABNORMAL HIGH (ref 0.00–0.07)
Basophils Absolute: 0.1 10*3/uL (ref 0.0–0.1)
Basophils Relative: 0 %
Eosinophils Absolute: 0.1 10*3/uL (ref 0.0–0.5)
Eosinophils Relative: 0 %
HCT: 37.5 % (ref 36.0–46.0)
Hemoglobin: 12 g/dL (ref 12.0–15.0)
Immature Granulocytes: 1 %
Lymphocytes Relative: 18 %
Lymphs Abs: 3.1 10*3/uL (ref 0.7–4.0)
MCH: 30.5 pg (ref 26.0–34.0)
MCHC: 32 g/dL (ref 30.0–36.0)
MCV: 95.4 fL (ref 80.0–100.0)
Monocytes Absolute: 1.9 10*3/uL — ABNORMAL HIGH (ref 0.1–1.0)
Monocytes Relative: 12 %
Neutro Abs: 11.5 10*3/uL — ABNORMAL HIGH (ref 1.7–7.7)
Neutrophils Relative %: 69 %
Platelets: 513 10*3/uL — ABNORMAL HIGH (ref 150–400)
RBC: 3.93 MIL/uL (ref 3.87–5.11)
RDW: 14.4 % (ref 11.5–15.5)
WBC: 16.7 10*3/uL — ABNORMAL HIGH (ref 4.0–10.5)
nRBC: 0 % (ref 0.0–0.2)

## 2020-03-14 LAB — COMPREHENSIVE METABOLIC PANEL
ALT: 14 U/L (ref 0–44)
AST: 21 U/L (ref 15–41)
Albumin: 3.7 g/dL (ref 3.5–5.0)
Alkaline Phosphatase: 73 U/L (ref 38–126)
Anion gap: 9 (ref 5–15)
BUN: 48 mg/dL — ABNORMAL HIGH (ref 8–23)
CO2: 27 mmol/L (ref 22–32)
Calcium: 9.2 mg/dL (ref 8.9–10.3)
Chloride: 96 mmol/L — ABNORMAL LOW (ref 98–111)
Creatinine, Ser: 2.65 mg/dL — ABNORMAL HIGH (ref 0.44–1.00)
GFR, Estimated: 19 mL/min — ABNORMAL LOW (ref >60)
Glucose, Bld: 191 mg/dL — ABNORMAL HIGH (ref 70–99)
Potassium: 3.8 mmol/L (ref 3.5–5.1)
Sodium: 132 mmol/L — ABNORMAL LOW (ref 135–145)
Total Bilirubin: 0.3 mg/dL (ref 0.3–1.2)
Total Protein: 7.1 g/dL (ref 6.5–8.1)

## 2020-03-14 MED ORDER — SODIUM CHLORIDE 0.9% FLUSH
10.0000 mL | INTRAVENOUS | Status: DC | PRN
  Administered 2020-03-14: 10 mL via INTRAVENOUS
  Filled 2020-03-14: qty 10

## 2020-03-14 MED ORDER — SODIUM CHLORIDE 0.9 % IV SOLN
Freq: Once | INTRAVENOUS | Status: AC
  Filled 2020-03-14: qty 250

## 2020-03-14 MED ORDER — PREDNISONE 50 MG PO TABS: ORAL_TABLET | ORAL | 4 refills | Status: DC

## 2020-03-14 NOTE — Patient Instructions (Signed)
Rehydration, Adult Rehydration is the replacement of body fluids, salts, and minerals (electrolytes) that are lost during dehydration. Dehydration is when there is not enough water or other fluids in the body. This happens when you lose more fluids than you take in. Common causes of dehydration include:  Not drinking enough fluids. This can occur when you are ill or doing activities that require a lot of energy, especially in hot weather.  Conditions that cause loss of water or other fluids, such as diarrhea, vomiting, sweating, or urinating a lot.  Other illnesses, such as fever or infection.  Certain medicines, such as those that remove excess fluid from the body (diuretics). Symptoms of mild or moderate dehydration may include thirst, dry lips and mouth, and dizziness. Symptoms of severe dehydration may include increased heart rate, confusion, fainting, and not urinating. For severe dehydration, you may need to get fluids through an IV at the hospital. For mild or moderate dehydration, you can usually rehydrate at home by drinking certain fluids as told by your health care provider. What are the risks? Generally, rehydration is safe. However, taking in too much fluid (overhydration) can be a problem. This is rare. Overhydration can cause an electrolyte imbalance, kidney failure, or a decrease in salt (sodium) levels in the body. Supplies needed You will need an oral rehydration solution (ORS) if your health care provider tells you to use one. This is a drink to treat dehydration. It can be found in pharmacies and retail stores. How to rehydrate Fluids Follow instructions from your health care provider for rehydration. The kind of fluid and the amount you should drink depend on your condition. In general, you should choose drinks that you prefer.  If told by your health care provider, drink an ORS. ? Make an ORS by following instructions on the package. ? Start by drinking small amounts,  about  cup (120 mL) every 5-10 minutes. ? Slowly increase how much you drink until you have taken the amount recommended by your health care provider.  Drink enough clear fluids to keep your urine pale yellow. If you were told to drink an ORS, finish it first, then start slowly drinking other clear fluids. Drink fluids such as: ? Water. This includes sparkling water and flavored water. Drinking only water can lead to having too little sodium in your body (hyponatremia). Follow the advice of your health care provider. ? Water from ice chips you suck on. ? Fruit juice with water you add to it (diluted). ? Sports drinks. ? Hot or cold herbal teas. ? Broth-based soups. ? Milk or milk products. Food Follow instructions from your health care provider about what to eat while you rehydrate. Your health care provider may recommend that you slowly begin eating regular foods in small amounts.  Eat foods that contain a healthy balance of electrolytes, such as bananas, oranges, potatoes, tomatoes, and spinach.  Avoid foods that are greasy or contain a lot of sugar. In some cases, you may get nutrition through a feeding tube that is passed through your nose and into your stomach (nasogastric tube, or NG tube). This may be done if you have uncontrolled vomiting or diarrhea.   Beverages to avoid Certain beverages may make dehydration worse. While you rehydrate, avoid drinking alcohol.   How to tell if you are recovering from dehydration You may be recovering from dehydration if:  You are urinating more often than before you started rehydrating.  Your urine is pale yellow.  Your energy level   improves.  You vomit less frequently.  You have diarrhea less frequently.  Your appetite improves or returns to normal.  You feel less dizzy or less light-headed.  Your skin tone and color start to look more normal. Follow these instructions at home:  Take over-the-counter and prescription medicines only  as told by your health care provider.  Do not take sodium tablets. Doing this can lead to having too much sodium in your body (hypernatremia). Contact a health care provider if:  You continue to have symptoms of mild or moderate dehydration, such as: ? Thirst. ? Dry lips. ? Slightly dry mouth. ? Dizziness. ? Dark urine or less urine than normal. ? Muscle cramps.  You continue to vomit or have diarrhea. Get help right away if you:  Have symptoms of dehydration that get worse.  Have a fever.  Have a severe headache.  Have been vomiting and the following happens: ? Your vomiting gets worse or does not go away. ? Your vomit includes blood or green matter (bile). ? You cannot eat or drink without vomiting.  Have problems with urination or bowel movements, such as: ? Diarrhea that gets worse or does not go away. ? Blood in your stool (feces). This may cause stool to look black and tarry. ? Not urinating, or urinating only a small amount of very dark urine, within 6-8 hours.  Have trouble breathing.  Have symptoms that get worse with treatment. These symptoms may represent a serious problem that is an emergency. Do not wait to see if the symptoms will go away. Get medical help right away. Call your local emergency services (911 in the [place] Do not drive yourself to the hospital. Summary  Rehydration is the replacement of body fluids and minerals (electrolytes) that are lost during dehydration.  Follow instructions from your health care provider for rehydration. The kind of fluid and amount you should drink depend on your condition.  Slowly increase how much you drink until you have taken the amount recommended by your health care provider.  Contact your health care provider if you continue to show signs of mild or moderate dehydration. This information is not intended to replace advice given to you by your health care provider. Make sure you discuss any questions you have with  your health care provider. Document Revised: 02/24/2019 Document Reviewed: 01/04/2019 Elsevier Patient Education  2021 [employer]  

## 2020-03-14 NOTE — Patient Instructions (Signed)
Implanted Port Insertion, Care After This sheet gives you information about how to care for yourself after your procedure. Your health care provider may also give you more specific instructions. If you have problems or questions, contact your health care provider. What can I expect after the procedure? After the procedure, it is common to have:  Discomfort at the port insertion site.  Bruising on the skin over the port. This should improve over 3-4 days. Follow these instructions at home: Port care  After your port is placed, you will get a manufacturer's information card. The card has information about your port. Keep this card with you at all times.  Take care of the port as told by your health care provider. Ask your health care provider if you or a family member can get training for taking care of the port at home. A home health care nurse may also take care of the port.  Make sure to remember what type of port you have. Incision care  Follow instructions from your health care provider about how to take care of your port insertion site. Make sure you: ? Wash your hands with soap and water before and after you change your bandage (dressing). If soap and water are not available, use hand sanitizer. ? Change your dressing as told by your health care provider. ? Leave stitches (sutures), skin glue, or adhesive strips in place. These skin closures may need to stay in place for 2 weeks or longer. If adhesive strip edges start to loosen and curl up, you may trim the loose edges. Do not remove adhesive strips completely unless your health care provider tells you to do that.  Check your port insertion site every day for signs of infection. Check for: ? Redness, swelling, or pain. ? Fluid or blood. ? Warmth. ? Pus or a bad smell.      Activity  Return to your normal activities as told by your health care provider. Ask your health care provider what activities are safe for you.  Do not  lift anything that is heavier than 10 lb (4.5 kg), or the limit that you are told, until your health care provider says that it is safe. General instructions  Take over-the-counter and prescription medicines only as told by your health care provider.  Do not take baths, swim, or use a hot tub until your health care provider approves. Ask your health care provider if you may take showers. You may only be allowed to take sponge baths.  Do not drive for 24 hours if you were given a sedative during your procedure.  Wear a medical alert bracelet in case of an emergency. This will tell any health care providers that you have a port.  Keep all follow-up visits as told by your health care provider. This is important. Contact a health care provider if:  You cannot flush your port with saline as directed, or you cannot draw blood from the port.  You have a fever or chills.  You have redness, swelling, or pain around your port insertion site.  You have fluid or blood coming from your port insertion site.  Your port insertion site feels warm to the touch.  You have pus or a bad smell coming from the port insertion site. Get help right away if:  You have chest pain or shortness of breath.  You have bleeding from your port that you cannot control. Summary  Take care of the port as told by your   health care provider. Keep the manufacturer's information card with you at all times.  Change your dressing as told by your health care provider.  Contact a health care provider if you have a fever or chills or if you have redness, swelling, or pain around your port insertion site.  Keep all follow-up visits as told by your health care provider. This information is not intended to replace advice given to you by your health care provider. Make sure you discuss any questions you have with your health care provider. Document Revised: 07/22/2017 Document Reviewed: 07/22/2017 Elsevier Patient Education   2021 [employer]  

## 2020-03-16 NOTE — Progress Notes (Signed)
[facility] OFFICE PROGRESS NOTE  Danielle Sanders, MD [place] [id number]  DIAGNOSIS: Metastatic non-small cell lung cancer initially diagnosed as locally advanced stage IIIB with a right Pancoast tumor involving the vertebral body as well as prominent canal invasion with spinal cord compression in October 2007. The patient also has metastatic disease to the brain in April 2008.  PRIOR THERAPY: 1. Status post concurrent chemoradiation with weekly carboplatin and paclitaxel, last dose was given November 18, 2005. 2. 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 3. Status post gamma knife radiotherapy to a solitary brain lesion located in the superior frontal area of the brain at [facility] in April of 2008. 4. Status post palliative radiotherapy to the lateral abdominal wall metastatic lesion under the care of Dr. Lisbeth Renshaw, completed March of 2009. 5. Status post 6 cycles of systemic chemotherapy with carboplatin and Alimta. Last dose was given July 26, 2007 with disease stabilization. 6. Gamma knife stereotactic radiotherapy to 2 brain lesions one involving the right frontal dural based as well as right parietal lesion performed on 05/07/2012 under the care of Dr. Vallarie Mare at [facility].   CURRENT THERAPY: Maintenance systemic chemotherapy with Alimta 500 MG/M2 every 3 weeks, status post 217cycles.  INTERVAL HISTORY: Danielle [name] 70 y.o. female returns to the clinic today for a follow up visit. At the patient's last appointment, she had evidence of AKI on routine labs likely secondary to dehydration from her recent adjustments in her BP medications as well as some nausea/vomiting she experienced about 2 days prior to her last appointment. She received IVF last week and has been increasing her oral intake. She resumed her Norvasc 10 mg daily for BP management but has held  her lisinopril-HCTZ. She states she is urinating normally now.    Otherwise, she is feeling well. Otherwise, she is tolerating her treatment with maintenance Alimta well without any adverse side effects except for occasional nausea/vomiting. She takes zofran for nausea and compazine if needed.She denies any feveror chills.She reportedly has baseline night sweats for several years, which are unchanged.She denies any hemoptysis, cough,chest pain. She reports her baseline shortness of breath with exertion.She denies any diarrhea or constipation. She denies any headaches or visual changes.She is here today for evaluationbeforestarting cycle #218.   MEDICAL HISTORY: Past Medical History:  Diagnosis Date  . Anemia   . 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 Carcinomas, basal cell 01/22/2019   two places on face  . CVA (cerebral vascular accident) ([place]) 04/2014   pt states had 2 cva's within 2 wks  . DJD (degenerative joint disease), cervical   . Fibromyalgia   . H/O: pneumonia   . History of tobacco abuse quit 10/08   on nicotine patch  . Hypertension   . Hypokalemia   . Lung cancer ([place]) dx'd 09/2005   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 had chemo and radiation for lung ca  . Thrush 12/11/2010    ALLERGIES:  is allergic to contrast media [iodinated diagnostic agents], iohexol, sulfa drugs cross reactors, and co-trimoxazole injection [sulfamethoxazole-trimethoprim].  MEDICATIONS:  Current Outpatient Medications  Medication Sig Dispense Refill  . albuterol (VENTOLIN HFA) 108 (90 Base) MCG/ACT inhaler TAKE 2 PUFFS BY MOUTH EVERY 6 HOURS AS NEEDED FOR WHEEZE OR SHORTNESS OF BREATH 18 each 2  . alendronate (FOSAMAX) 70 MG tablet Take 1 tablet (70 mg total) by mouth every 7 (  seven) days. Take with a full glass of water on an empty stomach. 4 tablet 11  . amLODipine (NORVASC) 10 MG tablet TAKE 1 TABLET BY MOUTH EVERY DAY 90 tablet 2  . aspirin 325 MG EC tablet  TAKE 1 TABLET BY MOUTH EVERY DAY 90 tablet 0  . atorvastatin (LIPITOR) 40 MG tablet TAKE 1 TABLET BY MOUTH EVERY DAY AT 6 PM 90 tablet 1  . Cholecalciferol (VITAMIN D3) 50 MCG (2000 UT) TABS Take by mouth.    . diphenhydrAMINE (BENADRYL) 25 MG tablet Take 1 tablet (25 mg total) by mouth as directed. 2 hours before CT scan. 5 tablet 0  . FeFum-FePoly-FA-B Cmp-C-Biot (INTEGRA PLUS) CAPS TAKE 1 CAPSULE BY MOUTH EVERY DAY IN THE MORNING 90 capsule 3  . fluticasone (FLONASE) 50 MCG/ACT nasal spray Place 1-2 sprays into both nostrils daily as needed. 16 g 5  . folic acid (FOLVITE) 1 MG tablet TAKE 1 TABLET BY MOUTH EVERY DAY 90 tablet 1  . furosemide (LASIX) 20 MG tablet TAKE 1 TABLET BY MOUTH EVERY DAY AS NEEDED FOR SWELLING 20 tablet 1  . lidocaine-prilocaine (EMLA) cream Apply to the Port-A-Cath site 30-60-minute before treatment. 30 g 0  . lisinopril-hydrochlorothiazide (ZESTORETIC) 20-12.5 MG tablet Take 2 tablets by mouth daily. 90 tablet 3  . morphine (MS CONTIN) 60 MG 12 hr tablet Take 1 tablet (60 mg total) by mouth 2 (two) times daily. 60 tablet 0  . morphine (MSIR) 30 MG tablet Take 1 tablet (30 mg total) by mouth every 4 (four) hours as needed (breakthrough pain). 60 tablet 0  . Olopatadine HCl 0.2 % SOLN INSTILL 1 DROP INTO AFFECTED EYE(S) EVERY DAY 2.5 mL 1  . ondansetron (ZOFRAN) 8 MG tablet Take 1 tablet (8 mg total) by mouth every 8 (eight) hours as needed for nausea or vomiting. 20 tablet 0  . prochlorperazine (COMPAZINE) 10 MG tablet Take 1 tablet (10 mg total) by mouth every 6 (six) hours as needed. 30 tablet 2  . triamcinolone (KENALOG) 0.025 % ointment Apply 1 application topically 2 (two) times daily. 30 g 0  . umeclidinium bromide (INCRUSE ELLIPTA) 62.5 MCG/INH AEPB Inhale 1 puff into the lungs daily. 1 each 11  . predniSONE (DELTASONE) 50 MG tablet 1 tablet by mouth 13 hours ,7 hours and 1 hour  before CT scan 3 tablet 4   No current facility-administered medications for this  visit.    SURGICAL HISTORY:  Past Surgical History:  Procedure Laterality Date  . CERVICAL LAMINECTOMY  1995  . KNEE SURGERY  1990   Left x 2  . 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 LOOP RECORDER IMPLANT N/A 04/19/2014   Procedure: LOOP RECORDER IMPLANT;  Surgeon: Thompson Grayer, MD;  Location[facility] CATH LAB;  Service: Cardiovascular;  Laterality: N/A;  . OTHER SURGICAL HISTORY  2008   Gamma knife surgery to remove brain met   . PORTACATH PLACEMENT  -[name]   TIP IN CAVOATRIAL JUNCTION  . TEE WITHOUT CARDIOVERSION N/A 04/19/2014   Procedure: TRANSESOPHAGEAL ECHOCARDIOGRAM (TEE);  Surgeon: Larey Dresser, MD;  Location: [place] [facility];  Service: Cardiovascular;  Laterality: N/A;    REVIEW OF SYSTEMS:   Constitutional: Negative for appetite change, chills, fatigue, fever and unexpected weight change.  HENT:Negative for mouth sores, nosebleeds, sore throat and trouble swallowing.  Eyes: Negative for eye problems and icterus.  Respiratory:Positive for baseline shortness of breath with exertion.Negative for cough, hemoptysis, and  wheezing.  Cardiovascular:Positive for bilateral lower extremity swelling.Negative for chest pain. Gastrointestinal:Positive for occasional nausea and vomiting.Negative for abdominal pain, constipation, and diarrhea. Genitourinary:  Negative for bladder incontinence, difficulty urinating, dysuria, frequency and hematuria.  Musculoskeletal: Negative for back pain, gait problem, neck pain and neck stiffness.  Skin:Positive for several scars on her skin.Negative for itching and rash.  Neurological: Negative for dizziness, extremity weakness, gait problem, headaches, light-headedness and seizures.  Hematological: Negative for adenopathy. Does not bruise/bleed easily.  Psychiatric/Behavioral: Negative for confusion, depression and sleep disturbance. The patient is not nervous/anxious.      PHYSICAL EXAMINATION:  Blood pressure  (!) 153/70, pulse 86, temperature 97.7 F (36.5 C), temperature source Tympanic, resp. rate 19, height 5' 2.75" (1.594 m), weight 151 lb 14.4 oz (68.9 kg), SpO2 96 %.  ECOG PERFORMANCE STATUS: 1 - Symptomatic but completely ambulatory  Physical Exam  Constitutional: Oriented to person, place, and time and well-developed, well-nourished, and in no distress.  HENT:  Head: Normocephalic and atraumatic.  Mouth/Throat: Oropharynx is clear and moist. No oropharyngeal exudate.  Eyes: Conjunctivae are normal. Right eye exhibits no discharge. Left eye exhibits no discharge. No scleral icterus.  Neck: Normal range of motion. Neck supple.  Cardiovascular: Normal rate, regular rhythm, normal heart sounds and intact distal pulses.  Pulmonary/Chest: Effort normal and breath soundsquiet in all lung fields.No respiratory distress. No wheezes. No rales.  Abdominal: Soft. Bowel sounds are normal. Exhibits no distension and no mass. There is no tenderness.  Musculoskeletal: Positive for bilateral lower extremity edema.Normal range of motion.  Lymphadenopathy:  No cervical adenopathy.  Neurological: Alert and oriented to person, place, and time. Exhibits normal muscle tone. Gait normal. Coordination normal. Ambulates with a cane. Skin: Skin is warm and dry. No rash noted. Not diaphoretic. No erythema. No pallor.  Psychiatric: Mood, memory and judgment normal.  Vitals reviewed.  LABORATORY DATA: Lab Results  Component Value Date   WBC 9.8 03/20/2020   HGB 10.8 (L) 03/20/2020   HCT 34.0 (L) 03/20/2020   MCV 96.3 03/20/2020   PLT 465 (H) 03/20/2020      Chemistry      Component Value Date/Time   NA 138 03/20/2020 1413   NA 142 12/24/2016 0816   K 4.3 03/20/2020 1413   K 4.4 12/24/2016 0816   CL 104 03/20/2020 1413   CL 106 06/30/2012 1004   CO2 28 03/20/2020 1413   CO2 28 12/24/2016 0816   BUN 15 03/20/2020 1413   BUN 15.8 12/24/2016 0816   CREATININE 0.86 03/20/2020 1413   CREATININE  1.1 12/24/2016 0816      Component Value Date/Time   CALCIUM 9.2 03/20/2020 1413   CALCIUM 9.6 12/24/2016 0816   ALKPHOS 71 03/20/2020 1413   ALKPHOS 80 12/24/2016 0816   AST 18 03/20/2020 1413   AST 20 12/24/2016 0816   ALT 10 03/20/2020 1413   ALT 11 12/24/2016 0816   BILITOT 0.2 (L) 03/20/2020 1413   BILITOT 0.34 12/24/2016 0816       RADIOGRAPHIC STUDIES:  No results found.   ASSESSMENT/PLAN:  This isa very pleasant 70 year old Caucasian female with metastatic non-small cell lung cancer who was initially diagnosed in October 2007. She is status post current chemoradiation followed by consolidation chemotherapy.  She is currently undergoing weeklymaintainancechemotherapy with single agent Alimta 500 mg/m. She is status post217cycles.She is tolerating treatment well without any concerning complaintsexcept mild nausea/vomiting.  Labs were reviewed with Dr. Julien Nordmann. Her creatinine is back to her baseline today. Therefore,  we will resume her treatment with Alimta today. She will receive cycle #218.   I will arrange for a restaging CT scan of the chest, abdomen, and pelvis prior to starting her next cycle of treatment. She will take her premedications due to her contrast allergy which I sent to her pharmacy. Dr. Julien Nordmann feels it is ok to order the scan with contrast due to her improvement in her creatinine to her baseline.   We will see her for a follow up visit in 3 weeks for evaluation and to review her scan before starting cycle #219.   I will forward her lab results today to her PCP to make the final recommendation regarding resuming her lisinopril at her previous dose and for any further prescriptions for blood pressure management.   The patient does continue to have bilateral lower extremity swelling. She was instructed to elevate her legs and use compression stockings.   The patient was advised to call immediately if she has any concerning symptoms in the  interval. The patient voices understanding of current disease status and treatment options and is in agreement with the current care plan. All questions were answered. The patient knows to call the clinic with any problems, questions or concerns. We can certainly see the patient much sooner if necessary       Orders Placed This Encounter  Procedures  . CT Chest W Contrast    Standing Status:   Future    Standing Expiration Date:   03/20/2021    Order Specific Question:   If indicated for the ordered procedure, I authorize the administration of contrast media per Radiology protocol    Answer:   Yes    Order Specific Question:   Preferred imaging location?    Answer:   [facility]  . CT Abdomen Pelvis W Contrast    Standing Status:   Future    Standing Expiration Date:   03/20/2021    Order Specific Question:   If indicated for the ordered procedure, I authorize the administration of contrast media per Radiology protocol    Answer:   Yes    Order Specific Question:   Preferred imaging location?    Answer:   [facility]    Order Specific Question:   Release to patient    Answer:   Immediate    Order Specific Question:   Is Oral Contrast requested for this exam?    Answer:   Yes, Per Radiology protocol     I spent 20-29 minutes in this encounter.   Cassandra L Heilingoetter, PA-C 03/20/20

## 2020-03-20 ENCOUNTER — Inpatient Hospital Stay (HOSPITAL_BASED_OUTPATIENT_CLINIC_OR_DEPARTMENT_OTHER): Payer: Medicare (Managed Care) | Admitting: Physician Assistant

## 2020-03-20 ENCOUNTER — Inpatient Hospital Stay: Payer: Medicare (Managed Care)

## 2020-03-20 ENCOUNTER — Other Ambulatory Visit: Payer: Self-pay

## 2020-03-20 ENCOUNTER — Encounter: Payer: Self-pay | Admitting: Physician Assistant

## 2020-03-20 DIAGNOSIS — Z5111 Encounter for antineoplastic chemotherapy: Secondary | ICD-10-CM | POA: Diagnosis not present

## 2020-03-20 DIAGNOSIS — C3490 Malignant neoplasm of unspecified part of unspecified bronchus or lung: Secondary | ICD-10-CM | POA: Diagnosis not present

## 2020-03-20 DIAGNOSIS — C3411 Malignant neoplasm of upper lobe, right bronchus or lung: Secondary | ICD-10-CM

## 2020-03-20 DIAGNOSIS — Z85118 Personal history of other malignant neoplasm of bronchus and lung: Secondary | ICD-10-CM | POA: Diagnosis not present

## 2020-03-20 DIAGNOSIS — Z91041 Radiographic dye allergy status: Secondary | ICD-10-CM

## 2020-03-20 DIAGNOSIS — Z95828 Presence of other vascular implants and grafts: Secondary | ICD-10-CM

## 2020-03-20 LAB — CMP (CANCER CENTER ONLY)
ALT: 10 U/L (ref 0–44)
AST: 18 U/L (ref 15–41)
Albumin: 3.2 g/dL — ABNORMAL LOW (ref 3.5–5.0)
Alkaline Phosphatase: 71 U/L (ref 38–126)
Anion gap: 6 (ref 5–15)
BUN: 15 mg/dL (ref 8–23)
CO2: 28 mmol/L (ref 22–32)
Calcium: 9.2 mg/dL (ref 8.9–10.3)
Chloride: 104 mmol/L (ref 98–111)
Creatinine: 0.86 mg/dL (ref 0.44–1.00)
GFR, Estimated: >60 mL/min (ref >60)
Glucose, Bld: 121 mg/dL — ABNORMAL HIGH (ref 70–99)
Potassium: 4.3 mmol/L (ref 3.5–5.1)
Sodium: 138 mmol/L (ref 135–145)
Total Bilirubin: 0.2 mg/dL — ABNORMAL LOW (ref 0.3–1.2)
Total Protein: 6.7 g/dL (ref 6.5–8.1)

## 2020-03-20 LAB — CBC WITH DIFFERENTIAL (CANCER CENTER ONLY)
Abs Immature Granulocytes: 0.04 10*3/uL (ref 0.00–0.07)
Basophils Absolute: 0.1 10*3/uL (ref 0.0–0.1)
Basophils Relative: 1 %
Eosinophils Absolute: 0.1 10*3/uL (ref 0.0–0.5)
Eosinophils Relative: 1 %
HCT: 34 % — ABNORMAL LOW (ref 36.0–46.0)
Hemoglobin: 10.8 g/dL — ABNORMAL LOW (ref 12.0–15.0)
Immature Granulocytes: 0 %
Lymphocytes Relative: 24 %
Lymphs Abs: 2.4 10*3/uL (ref 0.7–4.0)
MCH: 30.6 pg (ref 26.0–34.0)
MCHC: 31.8 g/dL (ref 30.0–36.0)
MCV: 96.3 fL (ref 80.0–100.0)
Monocytes Absolute: 1.2 10*3/uL — ABNORMAL HIGH (ref 0.1–1.0)
Monocytes Relative: 12 %
Neutro Abs: 6 10*3/uL (ref 1.7–7.7)
Neutrophils Relative %: 62 %
Platelet Count: 465 10*3/uL — ABNORMAL HIGH (ref 150–400)
RBC: 3.53 MIL/uL — ABNORMAL LOW (ref 3.87–5.11)
RDW: 14.3 % (ref 11.5–15.5)
WBC Count: 9.8 10*3/uL (ref 4.0–10.5)
nRBC: 0 % (ref 0.0–0.2)

## 2020-03-20 MED ORDER — SODIUM CHLORIDE 0.9% FLUSH
10.0000 mL | INTRAVENOUS | Status: DC | PRN
  Administered 2020-03-20: 10 mL via INTRAVENOUS
  Filled 2020-03-20: qty 10

## 2020-03-20 MED ORDER — SODIUM CHLORIDE 0.9% FLUSH
10.0000 mL | INTRAVENOUS | Status: DC | PRN
  Administered 2020-03-20: 10 mL
  Filled 2020-03-20: qty 10

## 2020-03-20 MED ORDER — ONDANSETRON HCL 8 MG PO TABS
ORAL_TABLET | ORAL | Status: AC
  Filled 2020-03-20: qty 1

## 2020-03-20 MED ORDER — ONDANSETRON HCL 8 MG PO TABS
8.0000 mg | ORAL_TABLET | Freq: Once | ORAL | Status: AC
  Administered 2020-03-20: 8 mg via ORAL

## 2020-03-20 MED ORDER — SODIUM CHLORIDE 0.9 % IV SOLN
Freq: Once | INTRAVENOUS | Status: AC
  Filled 2020-03-20: qty 250

## 2020-03-20 MED ORDER — PREDNISONE 50 MG PO TABS: ORAL_TABLET | ORAL | 4 refills | Status: DC

## 2020-03-20 MED ORDER — SODIUM CHLORIDE 0.9 % IV SOLN
10.0000 mg | Freq: Once | INTRAVENOUS | Status: AC
  Administered 2020-03-20: 10 mg via INTRAVENOUS
  Filled 2020-03-20: qty 1
  Filled 2020-03-20: qty 10

## 2020-03-20 MED ORDER — HEPARIN SOD (PORK) LOCK FLUSH 100 UNIT/ML IV SOLN
500.0000 [IU] | Freq: Once | INTRAVENOUS | Status: AC | PRN
  Administered 2020-03-20: 500 [IU]
  Filled 2020-03-20: qty 5

## 2020-03-20 MED ORDER — SODIUM CHLORIDE 0.9 % IV SOLN
500.0000 mg/m2 | Freq: Once | INTRAVENOUS | Status: AC
  Administered 2020-03-20: 800 mg via INTRAVENOUS
  Filled 2020-03-20: qty 20

## 2020-03-20 NOTE — Patient Instructions (Signed)
[facility] Discharge Instructions for Patients Receiving Chemotherapy  Today you received the following chemotherapy agent: Pemetrexed (Alimta)  To help prevent nausea and vomiting after your treatment, we encourage you to take your nausea medication as directed by your MD.   If you develop nausea and vomiting that is not controlled by your nausea medication, call the clinic.   BELOW ARE SYMPTOMS THAT SHOULD BE REPORTED IMMEDIATELY:  *FEVER GREATER THAN 100.5 F  *CHILLS WITH OR WITHOUT FEVER  NAUSEA AND VOMITING THAT IS NOT CONTROLLED WITH YOUR NAUSEA MEDICATION  *UNUSUAL SHORTNESS OF BREATH  *UNUSUAL BRUISING OR BLEEDING  TENDERNESS IN MOUTH AND THROAT WITH OR WITHOUT PRESENCE OF ULCERS  *URINARY PROBLEMS  *BOWEL PROBLEMS  UNUSUAL RASH Items with * indicate a potential emergency and should be followed up as soon as possible.  Feel free to call the clinic should you have any questions or concerns. The clinic phone number is [phone].  Please show the [facility] at check-in to the Emergency Department and triage nurse.

## 2020-03-21 ENCOUNTER — Inpatient Hospital Stay: Payer: Medicare (Managed Care)

## 2020-03-21 ENCOUNTER — Telehealth: Payer: Self-pay | Admitting: *Deleted

## 2020-03-21 ENCOUNTER — Inpatient Hospital Stay: Payer: Medicare (Managed Care) | Admitting: Physician Assistant

## 2020-03-21 ENCOUNTER — Ambulatory Visit: Payer: Medicare (Managed Care)

## 2020-03-21 NOTE — Progress Notes (Signed)
Pt has not responded to my chart or viewed the note below. Call  Let her know oncology notified me her BP is still elevated.  Have her restart the lisinopril and check her BP daily at home.. call by end of week with BPs back on lisinopril

## 2020-03-21 NOTE — Telephone Encounter (Signed)
-----   Message from [name], MD sent at 03/21/2020  8:31 AM EDT -----   ----- Message ----- From: Heilingoetter, Tobe Sos, PA-C Sent: 03/20/2020   3:18 PM EDT To: [name], MD

## 2020-03-21 NOTE — Progress Notes (Signed)
See Phone Note.

## 2020-03-21 NOTE — Telephone Encounter (Signed)
Danielle [name] notified by telephone that her oncologist messaged Dr. Diona Browner to let her know that Danielle [name]'s blood pressure is still elevated.  Patient advised to restart her Lisinopril.  Check her blood pressure daily and send or call us with those reading by the end of the week.  Patient states understanding.

## 2020-03-27 ENCOUNTER — Other Ambulatory Visit: Payer: Self-pay | Admitting: Physician Assistant

## 2020-03-27 DIAGNOSIS — C3410 Malignant neoplasm of upper lobe, unspecified bronchus or lung: Secondary | ICD-10-CM

## 2020-03-27 DIAGNOSIS — C3490 Malignant neoplasm of unspecified part of unspecified bronchus or lung: Secondary | ICD-10-CM

## 2020-03-27 MED ORDER — MORPHINE SULFATE 30 MG PO TABS: 30.0000 mg | ORAL_TABLET | ORAL | 0 refills | Status: DC | PRN

## 2020-03-27 MED ORDER — MORPHINE SULFATE ER 60 MG PO TBCR: 60.0000 mg | EXTENDED_RELEASE_TABLET | Freq: Two times a day (BID) | ORAL | 0 refills | Status: DC

## 2020-03-29 ENCOUNTER — Other Ambulatory Visit: Payer: Self-pay | Admitting: Family Medicine

## 2020-03-29 ENCOUNTER — Other Ambulatory Visit: Payer: Self-pay | Admitting: Physician Assistant

## 2020-03-29 DIAGNOSIS — C3410 Malignant neoplasm of upper lobe, unspecified bronchus or lung: Secondary | ICD-10-CM

## 2020-03-29 DIAGNOSIS — C3411 Malignant neoplasm of upper lobe, right bronchus or lung: Secondary | ICD-10-CM

## 2020-04-04 ENCOUNTER — Ambulatory Visit: Payer: 59

## 2020-04-04 ENCOUNTER — Ambulatory Visit: Payer: 59 | Admitting: Internal Medicine

## 2020-04-04 ENCOUNTER — Other Ambulatory Visit: Payer: 59

## 2020-04-07 ENCOUNTER — Ambulatory Visit (HOSPITAL_COMMUNITY): Payer: Medicare (Managed Care)

## 2020-04-11 ENCOUNTER — Inpatient Hospital Stay: Payer: Medicare (Managed Care)

## 2020-04-11 ENCOUNTER — Inpatient Hospital Stay: Payer: Medicare (Managed Care) | Attending: Physician Assistant | Admitting: Internal Medicine

## 2020-04-11 ENCOUNTER — Encounter: Payer: Self-pay | Admitting: Internal Medicine

## 2020-04-11 ENCOUNTER — Inpatient Hospital Stay: Payer: Medicare (Managed Care) | Admitting: Dietician

## 2020-04-11 ENCOUNTER — Other Ambulatory Visit: Payer: Self-pay

## 2020-04-11 VITALS — BP 152/63 | HR 100 | Temp 97.9°F | Resp 15 | Ht 62.75 in | Wt 150.3 lb

## 2020-04-11 DIAGNOSIS — C3411 Malignant neoplasm of upper lobe, right bronchus or lung: Secondary | ICD-10-CM

## 2020-04-11 DIAGNOSIS — Z95828 Presence of other vascular implants and grafts: Secondary | ICD-10-CM

## 2020-04-11 DIAGNOSIS — Z8673 Personal history of transient ischemic attack (TIA), and cerebral infarction without residual deficits: Secondary | ICD-10-CM | POA: Insufficient documentation

## 2020-04-11 DIAGNOSIS — I700 Atherosclerosis of aorta: Secondary | ICD-10-CM | POA: Diagnosis not present

## 2020-04-11 DIAGNOSIS — Z5111 Encounter for antineoplastic chemotherapy: Secondary | ICD-10-CM | POA: Insufficient documentation

## 2020-04-11 DIAGNOSIS — R110 Nausea: Secondary | ICD-10-CM | POA: Diagnosis not present

## 2020-04-11 DIAGNOSIS — Z79891 Long term (current) use of opiate analgesic: Secondary | ICD-10-CM | POA: Diagnosis not present

## 2020-04-11 DIAGNOSIS — Z79899 Other long term (current) drug therapy: Secondary | ICD-10-CM | POA: Insufficient documentation

## 2020-04-11 DIAGNOSIS — J432 Centrilobular emphysema: Secondary | ICD-10-CM | POA: Insufficient documentation

## 2020-04-11 DIAGNOSIS — I10 Essential (primary) hypertension: Secondary | ICD-10-CM | POA: Insufficient documentation

## 2020-04-11 DIAGNOSIS — Z7983 Long term (current) use of bisphosphonates: Secondary | ICD-10-CM | POA: Insufficient documentation

## 2020-04-11 DIAGNOSIS — R5383 Other fatigue: Secondary | ICD-10-CM | POA: Insufficient documentation

## 2020-04-11 DIAGNOSIS — C7931 Secondary malignant neoplasm of brain: Secondary | ICD-10-CM | POA: Diagnosis not present

## 2020-04-11 DIAGNOSIS — Z923 Personal history of irradiation: Secondary | ICD-10-CM | POA: Diagnosis not present

## 2020-04-11 DIAGNOSIS — M879 Osteonecrosis, unspecified: Secondary | ICD-10-CM | POA: Diagnosis not present

## 2020-04-11 DIAGNOSIS — D6481 Anemia due to antineoplastic chemotherapy: Secondary | ICD-10-CM | POA: Diagnosis not present

## 2020-04-11 DIAGNOSIS — M47812 Spondylosis without myelopathy or radiculopathy, cervical region: Secondary | ICD-10-CM | POA: Diagnosis not present

## 2020-04-11 DIAGNOSIS — Z7982 Long term (current) use of aspirin: Secondary | ICD-10-CM | POA: Diagnosis not present

## 2020-04-11 DIAGNOSIS — T451X5A Adverse effect of antineoplastic and immunosuppressive drugs, initial encounter: Secondary | ICD-10-CM

## 2020-04-11 DIAGNOSIS — Z87891 Personal history of nicotine dependence: Secondary | ICD-10-CM | POA: Diagnosis not present

## 2020-04-11 DIAGNOSIS — C3490 Malignant neoplasm of unspecified part of unspecified bronchus or lung: Secondary | ICD-10-CM

## 2020-04-11 DIAGNOSIS — C3491 Malignant neoplasm of unspecified part of right bronchus or lung: Secondary | ICD-10-CM | POA: Insufficient documentation

## 2020-04-11 DIAGNOSIS — M797 Fibromyalgia: Secondary | ICD-10-CM | POA: Insufficient documentation

## 2020-04-11 LAB — CMP (CANCER CENTER ONLY)
ALT: 13 U/L (ref 0–44)
AST: 22 U/L (ref 15–41)
Albumin: 3.2 g/dL — ABNORMAL LOW (ref 3.5–5.0)
Alkaline Phosphatase: 75 U/L (ref 38–126)
Anion gap: 11 (ref 5–15)
BUN: 11 mg/dL (ref 8–23)
CO2: 23 mmol/L (ref 22–32)
Calcium: 9 mg/dL (ref 8.9–10.3)
Chloride: 106 mmol/L (ref 98–111)
Creatinine: 0.81 mg/dL (ref 0.44–1.00)
GFR, Estimated: >60 mL/min (ref >60)
Glucose, Bld: 176 mg/dL — ABNORMAL HIGH (ref 70–99)
Potassium: 4.1 mmol/L (ref 3.5–5.1)
Sodium: 140 mmol/L (ref 135–145)
Total Bilirubin: 0.4 mg/dL (ref 0.3–1.2)
Total Protein: 6.9 g/dL (ref 6.5–8.1)

## 2020-04-11 LAB — CBC WITH DIFFERENTIAL (CANCER CENTER ONLY)
Abs Immature Granulocytes: 0.05 10*3/uL (ref 0.00–0.07)
Basophils Absolute: 0.1 10*3/uL (ref 0.0–0.1)
Basophils Relative: 1 %
Eosinophils Absolute: 0.1 10*3/uL (ref 0.0–0.5)
Eosinophils Relative: 1 %
HCT: 35.6 % — ABNORMAL LOW (ref 36.0–46.0)
Hemoglobin: 11.3 g/dL — ABNORMAL LOW (ref 12.0–15.0)
Immature Granulocytes: 1 %
Lymphocytes Relative: 22 %
Lymphs Abs: 2.1 10*3/uL (ref 0.7–4.0)
MCH: 30.4 pg (ref 26.0–34.0)
MCHC: 31.7 g/dL (ref 30.0–36.0)
MCV: 95.7 fL (ref 80.0–100.0)
Monocytes Absolute: 1.1 10*3/uL — ABNORMAL HIGH (ref 0.1–1.0)
Monocytes Relative: 11 %
Neutro Abs: 6.4 10*3/uL (ref 1.7–7.7)
Neutrophils Relative %: 64 %
Platelet Count: 402 10*3/uL — ABNORMAL HIGH (ref 150–400)
RBC: 3.72 MIL/uL — ABNORMAL LOW (ref 3.87–5.11)
RDW: 14 % (ref 11.5–15.5)
WBC Count: 9.8 10*3/uL (ref 4.0–10.5)
nRBC: 0 % (ref 0.0–0.2)

## 2020-04-11 MED ORDER — SODIUM CHLORIDE 0.9 % IV SOLN
500.0000 mg/m2 | Freq: Once | INTRAVENOUS | Status: AC
  Administered 2020-04-11: 800 mg via INTRAVENOUS
  Filled 2020-04-11: qty 20

## 2020-04-11 MED ORDER — ONDANSETRON HCL 8 MG PO TABS
8.0000 mg | ORAL_TABLET | Freq: Once | ORAL | Status: AC
  Administered 2020-04-11: 8 mg via ORAL

## 2020-04-11 MED ORDER — DEXAMETHASONE SODIUM PHOSPHATE 100 MG/10ML IJ SOLN
10.0000 mg | Freq: Once | INTRAMUSCULAR | Status: AC
  Administered 2020-04-11: 10 mg via INTRAVENOUS
  Filled 2020-04-11: qty 10

## 2020-04-11 MED ORDER — SODIUM CHLORIDE 0.9% FLUSH
10.0000 mL | INTRAVENOUS | Status: DC | PRN
  Administered 2020-04-11: 10 mL via INTRAVENOUS
  Filled 2020-04-11: qty 10

## 2020-04-11 MED ORDER — ONDANSETRON HCL 8 MG PO TABS
ORAL_TABLET | ORAL | Status: AC
  Filled 2020-04-11: qty 1

## 2020-04-11 MED ORDER — DIPHENHYDRAMINE HCL 25 MG PO CAPS
ORAL_CAPSULE | ORAL | Status: AC
  Filled 2020-04-11: qty 2

## 2020-04-11 MED ORDER — SODIUM CHLORIDE 0.9 % IV SOLN
Freq: Once | INTRAVENOUS | Status: AC
Start: 2020-04-11 — End: 2020-04-11
  Filled 2020-04-11: qty 250

## 2020-04-11 MED ORDER — ACETAMINOPHEN 325 MG PO TABS
ORAL_TABLET | ORAL | Status: AC
  Filled 2020-04-11: qty 2

## 2020-04-11 NOTE — Patient Instructions (Signed)
[facility] Discharge Instructions for Patients Receiving Chemotherapy  Today you received the following chemotherapy agent: Pemetrexed (Alimta)  To help prevent nausea and vomiting after your treatment, we encourage you to take your nausea medication as directed by your MD.   If you develop nausea and vomiting that is not controlled by your nausea medication, call the clinic.   BELOW ARE SYMPTOMS THAT SHOULD BE REPORTED IMMEDIATELY:  *FEVER GREATER THAN 100.5 F  *CHILLS WITH OR WITHOUT FEVER  NAUSEA AND VOMITING THAT IS NOT CONTROLLED WITH YOUR NAUSEA MEDICATION  *UNUSUAL SHORTNESS OF BREATH  *UNUSUAL BRUISING OR BLEEDING  TENDERNESS IN MOUTH AND THROAT WITH OR WITHOUT PRESENCE OF ULCERS  *URINARY PROBLEMS  *BOWEL PROBLEMS  UNUSUAL RASH Items with * indicate a potential emergency and should be followed up as soon as possible.  Feel free to call the clinic should you have any questions or concerns. The clinic phone number is [phone].  Please show the [facility] at check-in to the Emergency Department and triage nurse.

## 2020-04-11 NOTE — Progress Notes (Signed)
Nutrition Assessment   Reason for Assessment: MST   ASSESSMENT: 70 year old female with NSCLC metastatic to spine and brain. She is s/p concurrent chemoradiation in Nov 2007 and Gamma knife in April 2008 as well as in May 2014. She is receiving Alimta q3 weeks s/p 216 cycles. Followed by Dr. Earlie Server.   Past medical history of CVA, DJD of spine, fibromyalgia, HTN, Hypokalemia.  Met with patient in infusion. She reports a good appetite and is eating well overall. She lives with family who prepare meals, but really enjoys going out to dinner. Yesterday she had tenderloin and eggs for breakfast and hamburger helper with peas and potatoes for dinner. Patient reports she drinks 2-3 vanilla Boost, 2 (48oz) glasses of water, and coffee daily. Patient reports experiencing severe nausea with vomiting after chemotherapy lasting usually 12 hours, but sometimes up to 24 hours. She tries to stay hydrated by drinking water "if it stays down" and tolerates peanut butter crackers. She denies constipation, diarrhea, altered taste. Patient reports maintaining her weights, recalls usual weight 145-150 lbs. Patient endorsed 8 lb weight loss ~1 month ago due to medication, states "it dried up my kidneys and they almost stopped my treatments" Patient reports she was taken off that medication and weights are stable.   Nutrition Focused Physical Exam: deferred   Medications: Fosamax, D3, Folivte, Lasix, MS Contin, Morphine, Zofran, Prednisone, Compazine  Labs: 4/5 Glucose 176, Albumin 3.2   Anthropometrics: Weight 150 lb 4.8 oz today stable  3/14 - 151 lb 14.4 oz 2/15 - 148 lb 14.4 oz 1/11 - 144 lb  12/14 - 146 lb 11.2 oz  Height: 5' 2.75" Weight: 68.2 kg UBW: 145-150 lb (per pt) BMI: 26.84   NUTRITION DIAGNOSIS: Predicted suboptimal intake related to cancer related treatment side effects as evidenced by reported 12-24 hours of nausea with vomiting after chemotherapy  INTERVENTION:  Discussed strategies  for improving oral intake with nausea and vomiting Handout provided Encouraged continuing to drink 2-3 Boost daily Provided coupons for Ensure (do have Boost coupons at this time) Sample of Ensure Complete given RD contact information provided   MONITORING, EVALUATION, GOAL: Patient will tolerate adequate calories and protein to maintain weight   Next Visit: No nutrition follow-up scheduled at this time. Patient will contact as needed.

## 2020-04-11 NOTE — Progress Notes (Signed)
[place] [phone]   [phone]  OFFICE PROGRESS NOTE  [name], MD [place] [address]  DIAGNOSIS: Metastatic non-small cell lung cancer initially diagnosed as locally advanced stage IIIB with a right Pancoast tumor involving the vertebral body as well as prominent canal invasion with spinal cord compression in October 2007. The patient also has metastatic disease to the brain in April 2008.  PRIOR THERAPY: 1. Status post concurrent chemoradiation with weekly carboplatin and paclitaxel, last dose was given November 18, 2005. 2. 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 3. Status post gamma knife radiotherapy to a solitary brain lesion located in the superior frontal area of the brain at [facility] in April of 2008. 4. Status post palliative radiotherapy to the lateral abdominal wall metastatic lesion under the care of Dr. Lisbeth Renshaw, completed March of 2009. 5. Status post 6 cycles of systemic chemotherapy with carboplatin and Alimta. Last dose was given July 26, 2007 with disease stabilization. 6. Gamma knife stereotactic radiotherapy to 2 brain lesions one involving the right frontal dural based as well as right parietal lesion performed on 05/07/2012 under the care of Dr. Vallarie Mare at [facility]  CURRENT THERAPY: Maintenance systemic chemotherapy with Alimta 500 MG/M2 every 3 weeks, status post 218 cycles.  INTERVAL HISTORY: Danielle [name] 70 y.o. female returns to the clinic today for follow-up visit.  The patient is feeling fine today with no concerning complaints except for mild fatigue and occasional nausea.  She has been tolerating her treatment with Alimta fairly well.  She was supposed to have repeat CT scan of the chest, abdomen pelvis before this visit but unfortunately it was delayed because of insurance issues.  The  patient has no chest pain, cough or hemoptysis.  She denied having any fever or chills.  She has no recent weight loss or night sweats.  She is here today for evaluation before starting cycle number 219.  MEDICAL HISTORY: Past Medical History:  Diagnosis Date  . Anemia   . 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 Carcinomas, basal cell 01/22/2019   two places on face  . CVA (cerebral vascular accident) ([place]) 04/2014   pt states had 2 cva's within 2 wks  . DJD (degenerative joint disease), cervical   . Fibromyalgia   . H/O: pneumonia   . History of tobacco abuse quit 10/08   on nicotine patch  . Hypertension   . Hypokalemia   . Lung cancer ([place]) dx'd 09/2005   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 had chemo and radiation for lung ca  . Thrush 12/11/2010    ALLERGIES:  is allergic to contrast media [iodinated diagnostic agents], iohexol, sulfa drugs cross reactors, and co-trimoxazole injection [sulfamethoxazole-trimethoprim].  MEDICATIONS:  Current Outpatient Medications  Medication Sig Dispense Refill  . albuterol (VENTOLIN HFA) 108 (90 Base) MCG/ACT inhaler TAKE 2 PUFFS BY MOUTH EVERY 6 HOURS AS NEEDED FOR WHEEZE OR SHORTNESS OF BREATH 18 each 2  . alendronate (FOSAMAX) 70 MG tablet Take 1 tablet (70 mg total) by mouth every 7 (seven) days. Take with a full glass of water on an empty stomach. 4 tablet 11  . amLODipine (NORVASC) 10 MG tablet TAKE 1 TABLET BY MOUTH EVERY DAY 90 tablet 2  . aspirin 325 MG EC tablet TAKE 1 TABLET BY MOUTH EVERY DAY 90 tablet 0  . atorvastatin (LIPITOR) 40  MG tablet TAKE 1 TABLET BY MOUTH EVERY DAY AT 6 PM 90 tablet 3  . Cholecalciferol (VITAMIN D3) 50 MCG (2000 UT) TABS Take by mouth.    . diphenhydrAMINE (BENADRYL) 25 MG tablet Take 1 tablet (25 mg total) by mouth as directed. 2 hours before CT scan. 5 tablet 0  . FeFum-FePoly-FA-B Cmp-C-Biot (INTEGRA PLUS) CAPS TAKE 1 CAPSULE BY MOUTH EVERY DAY IN THE MORNING 90 capsule 3  .  fluticasone (FLONASE) 50 MCG/ACT nasal spray Place 1-2 sprays into both nostrils daily as needed. 16 g 5  . folic acid (FOLVITE) 1 MG tablet TAKE 1 TABLET BY MOUTH EVERY DAY 90 tablet 1  . furosemide (LASIX) 20 MG tablet TAKE 1 TABLET BY MOUTH EVERY DAY AS NEEDED FOR SWELLING 20 tablet 1  . lidocaine-prilocaine (EMLA) cream Apply to the Port-A-Cath site 30-60-minute before treatment. 30 g 0  . lisinopril (ZESTRIL) 20 MG tablet Take 40 mg by mouth daily.    Marland Kitchen morphine (MS CONTIN) 60 MG 12 hr tablet Take 1 tablet (60 mg total) by mouth 2 (two) times daily. 60 tablet 0  . morphine (MSIR) 30 MG tablet Take 1 tablet (30 mg total) by mouth every 4 (four) hours as needed (breakthrough pain). 60 tablet 0  . Olopatadine HCl 0.2 % SOLN INSTILL 1 DROP INTO AFFECTED EYE(S) EVERY DAY 2.5 mL 1  . ondansetron (ZOFRAN) 8 MG tablet Take 1 tablet (8 mg total) by mouth every 8 (eight) hours as needed for nausea or vomiting. 20 tablet 0  . predniSONE (DELTASONE) 50 MG tablet 1 tablet by mouth 13 hours ,7 hours and 1 hour  before CT scan 3 tablet 4  . prochlorperazine (COMPAZINE) 10 MG tablet Take 1 tablet (10 mg total) by mouth every 6 (six) hours as needed. 30 tablet 2  . triamcinolone (KENALOG) 0.025 % ointment APPLY TO AFFECTED AREA TWICE A DAY 30 g 0  . umeclidinium bromide (INCRUSE ELLIPTA) 62.5 MCG/INH AEPB Inhale 1 puff into the lungs daily. 1 each 11   No current facility-administered medications for this visit.    SURGICAL HISTORY:  Past Surgical History:  Procedure Laterality Date  . CERVICAL LAMINECTOMY  1995  . KNEE SURGERY  1990   Left x 2  . 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 LOOP RECORDER IMPLANT N/A 04/19/2014   Procedure: LOOP RECORDER IMPLANT;  Surgeon: Thompson Grayer, MD;  Location: [facility] CATH LAB;  Service: Cardiovascular;  Laterality: N/A;  . OTHER SURGICAL HISTORY  2008   Gamma knife surgery to remove brain met   . PORTACATH PLACEMENT  -[name]   TIP IN  CAVOATRIAL JUNCTION  . TEE WITHOUT CARDIOVERSION N/A 04/19/2014   Procedure: TRANSESOPHAGEAL ECHOCARDIOGRAM (TEE);  Surgeon: Larey Dresser, MD;  Location: [place];  Service: Cardiovascular;  Laterality: N/A;    REVIEW OF SYSTEMS:  A comprehensive review of systems was negative except for: Constitutional: positive for fatigue Gastrointestinal: positive for nausea   PHYSICAL EXAMINATION: General appearance: alert, cooperative and no distress Head: Normocephalic, without obvious abnormality, atraumatic Neck: no adenopathy, no JVD, supple, symmetrical, trachea midline and thyroid not enlarged, symmetric, no tenderness/mass/nodules Lymph nodes: Cervical, supraclavicular, and axillary nodes normal. Resp: clear to auscultation bilaterally Back: symmetric, no curvature. ROM normal. No CVA tenderness. Cardio: regular rate and rhythm, S1, S2 normal, no murmur, click, rub or gallop GI: soft, non-tender; bowel sounds normal; no masses,  no organomegaly Extremities: extremities normal, atraumatic, no  cyanosis or edema  ECOG PERFORMANCE STATUS: 1 - Symptomatic but completely ambulatory  Blood pressure (!) 152/63, pulse 100, temperature 97.9 F (36.6 C), temperature source Tympanic, resp. rate 15, height 5' 2.75" (1.594 m), weight 150 lb 4.8 oz (68.2 kg), SpO2 95 %.  LABORATORY DATA: Lab Results  Component Value Date   WBC 9.8 04/11/2020   HGB 11.3 (L) 04/11/2020   HCT 35.6 (L) 04/11/2020   MCV 95.7 04/11/2020   PLT 402 (H) 04/11/2020      Chemistry      Component Value Date/Time   NA 138 03/20/2020 1413   NA 142 12/24/2016 0816   K 4.3 03/20/2020 1413   K 4.4 12/24/2016 0816   CL 104 03/20/2020 1413   CL 106 06/30/2012 1004   CO2 28 03/20/2020 1413   CO2 28 12/24/2016 0816   BUN 15 03/20/2020 1413   BUN 15.8 12/24/2016 0816   CREATININE 0.86 03/20/2020 1413   CREATININE 1.1 12/24/2016 0816      Component Value Date/Time   CALCIUM 9.2 03/20/2020 1413   CALCIUM 9.6 12/24/2016  0816   ALKPHOS 71 03/20/2020 1413   ALKPHOS 80 12/24/2016 0816   AST 18 03/20/2020 1413   AST 20 12/24/2016 0816   ALT 10 03/20/2020 1413   ALT 11 12/24/2016 0816   BILITOT 0.2 (L) 03/20/2020 1413   BILITOT 0.34 12/24/2016 0816       RADIOGRAPHIC STUDIES: No results found.  ASSESSMENT AND PLAN: This is a very pleasant 70 years old white female with metastatic non-small cell lung cancer diagnosed in October 2007 status post concurrent chemoradiation followed by consolidation chemotherapy and she is currently on maintenance treatment with single agent Alimta status post 218 cycles. The patient continues to do well and tolerated the treatment with no concerning complaints except mild fatigue and occasional nausea. I recommended for her to proceed with cycle #219 today as planned. I will make sure she gets her CT scan of the chest, abdomen pelvis done before her next visit.  She was given the number to contact the radiology scheduling. For pain management she will continue on MS Contin and morphine sulfate as needed. The patient was advised to call immediately if she has any concerning symptoms in the interval.  The patient voices understanding of current disease status and treatment options and is in agreement with the current care plan. All questions were answered. The patient knows to call the clinic with any problems, questions or concerns. We can certainly see the patient much sooner if necessary.  Disclaimer: This note was dictated with voice recognition software. Similar sounding words can inadvertently be transcribed and may not be corrected upon review.

## 2020-04-13 ENCOUNTER — Telehealth: Payer: Self-pay | Admitting: Internal Medicine

## 2020-04-13 NOTE — Telephone Encounter (Signed)
Scheduled per los. Called and left msg. Mailed printout  °

## 2020-04-25 ENCOUNTER — Other Ambulatory Visit: Payer: Medicare (Managed Care)

## 2020-04-25 ENCOUNTER — Ambulatory Visit: Payer: Medicare (Managed Care) | Admitting: Internal Medicine

## 2020-04-25 ENCOUNTER — Ambulatory Visit: Payer: Medicare (Managed Care)

## 2020-04-27 ENCOUNTER — Other Ambulatory Visit: Payer: Self-pay

## 2020-04-27 ENCOUNTER — Encounter (HOSPITAL_COMMUNITY): Payer: Self-pay

## 2020-04-27 ENCOUNTER — Ambulatory Visit (HOSPITAL_COMMUNITY)
Admission: RE | Admit: 2020-04-27 | Discharge: 2020-04-27 | Disposition: A | Discharge status: Home / Self Care | Payer: Medicare (Managed Care) | Source: Ambulatory Visit | Attending: Physician Assistant | Admitting: Physician Assistant

## 2020-04-27 DIAGNOSIS — C3490 Malignant neoplasm of unspecified part of unspecified bronchus or lung: Secondary | ICD-10-CM | POA: Insufficient documentation

## 2020-04-27 HISTORY — DX: Type 2 diabetes mellitus without complications: E11.9

## 2020-04-27 MED ORDER — IOHEXOL 300 MG/ML  SOLN
100.0000 mL | Freq: Once | INTRAMUSCULAR | Status: AC | PRN
  Administered 2020-04-27: 100 mL via INTRAVENOUS

## 2020-04-27 MED ORDER — HEPARIN SOD (PORK) LOCK FLUSH 100 UNIT/ML IV SOLN
INTRAVENOUS | Status: AC
  Administered 2020-04-27: 500 [IU]
  Filled 2020-04-27: qty 5

## 2020-04-28 ENCOUNTER — Other Ambulatory Visit: Payer: Self-pay | Admitting: Internal Medicine

## 2020-04-28 ENCOUNTER — Telehealth: Payer: Self-pay

## 2020-04-28 DIAGNOSIS — C3410 Malignant neoplasm of upper lobe, unspecified bronchus or lung: Secondary | ICD-10-CM

## 2020-04-28 DIAGNOSIS — C3490 Malignant neoplasm of unspecified part of unspecified bronchus or lung: Secondary | ICD-10-CM

## 2020-04-28 MED ORDER — MORPHINE SULFATE ER 60 MG PO TBCR: 60.0000 mg | EXTENDED_RELEASE_TABLET | Freq: Two times a day (BID) | ORAL | 0 refills | Status: DC

## 2020-04-28 NOTE — Telephone Encounter (Signed)
Pt called requesting a refill of her Morphine 60mg .  Pt states she has left messages somewhere with this request but is unable to indicate where. I advised pt I will send request to Dr. Julien Nordmann for review. She expressed understanding of this information.

## 2020-05-02 ENCOUNTER — Inpatient Hospital Stay: Payer: Medicare (Managed Care)

## 2020-05-02 ENCOUNTER — Inpatient Hospital Stay (HOSPITAL_BASED_OUTPATIENT_CLINIC_OR_DEPARTMENT_OTHER): Payer: Medicare (Managed Care) | Admitting: Internal Medicine

## 2020-05-02 ENCOUNTER — Other Ambulatory Visit: Payer: Self-pay

## 2020-05-02 VITALS — BP 154/65 | HR 90 | Temp 97.6°F | Resp 20 | Ht 62.75 in | Wt 148.1 lb

## 2020-05-02 DIAGNOSIS — C3411 Malignant neoplasm of upper lobe, right bronchus or lung: Secondary | ICD-10-CM

## 2020-05-02 DIAGNOSIS — D6481 Anemia due to antineoplastic chemotherapy: Secondary | ICD-10-CM

## 2020-05-02 DIAGNOSIS — Z95828 Presence of other vascular implants and grafts: Secondary | ICD-10-CM

## 2020-05-02 DIAGNOSIS — C7931 Secondary malignant neoplasm of brain: Secondary | ICD-10-CM | POA: Diagnosis not present

## 2020-05-02 DIAGNOSIS — C3490 Malignant neoplasm of unspecified part of unspecified bronchus or lung: Secondary | ICD-10-CM

## 2020-05-02 DIAGNOSIS — T451X5A Adverse effect of antineoplastic and immunosuppressive drugs, initial encounter: Secondary | ICD-10-CM

## 2020-05-02 DIAGNOSIS — Z5111 Encounter for antineoplastic chemotherapy: Secondary | ICD-10-CM

## 2020-05-02 LAB — CBC WITH DIFFERENTIAL (CANCER CENTER ONLY)
Abs Immature Granulocytes: 0.05 10*3/uL (ref 0.00–0.07)
Basophils Absolute: 0 10*3/uL (ref 0.0–0.1)
Basophils Relative: 0 %
Eosinophils Absolute: 0.1 10*3/uL (ref 0.0–0.5)
Eosinophils Relative: 1 %
HCT: 37.9 % (ref 36.0–46.0)
Hemoglobin: 12.1 g/dL (ref 12.0–15.0)
Immature Granulocytes: 1 %
Lymphocytes Relative: 19 %
Lymphs Abs: 2.1 10*3/uL (ref 0.7–4.0)
MCH: 30.1 pg (ref 26.0–34.0)
MCHC: 31.9 g/dL (ref 30.0–36.0)
MCV: 94.3 fL (ref 80.0–100.0)
Monocytes Absolute: 1 10*3/uL (ref 0.1–1.0)
Monocytes Relative: 9 %
Neutro Abs: 7.8 10*3/uL — ABNORMAL HIGH (ref 1.7–7.7)
Neutrophils Relative %: 70 %
Platelet Count: 403 10*3/uL — ABNORMAL HIGH (ref 150–400)
RBC: 4.02 MIL/uL (ref 3.87–5.11)
RDW: 14.5 % (ref 11.5–15.5)
WBC Count: 11 10*3/uL — ABNORMAL HIGH (ref 4.0–10.5)
nRBC: 0 % (ref 0.0–0.2)

## 2020-05-02 LAB — CMP (CANCER CENTER ONLY)
ALT: 10 U/L (ref 0–44)
AST: 24 U/L (ref 15–41)
Albumin: 3.4 g/dL — ABNORMAL LOW (ref 3.5–5.0)
Alkaline Phosphatase: 81 U/L (ref 38–126)
Anion gap: 9 (ref 5–15)
BUN: 8 mg/dL (ref 8–23)
CO2: 26 mmol/L (ref 22–32)
Calcium: 9.5 mg/dL (ref 8.9–10.3)
Chloride: 107 mmol/L (ref 98–111)
Creatinine: 0.82 mg/dL (ref 0.44–1.00)
GFR, Estimated: >60 mL/min (ref >60)
Glucose, Bld: 146 mg/dL — ABNORMAL HIGH (ref 70–99)
Potassium: 4 mmol/L (ref 3.5–5.1)
Sodium: 142 mmol/L (ref 135–145)
Total Bilirubin: 0.4 mg/dL (ref 0.3–1.2)
Total Protein: 7.3 g/dL (ref 6.5–8.1)

## 2020-05-02 MED ORDER — SODIUM CHLORIDE 0.9 % IV SOLN
Freq: Once | INTRAVENOUS | Status: AC
  Filled 2020-05-02: qty 250

## 2020-05-02 MED ORDER — CYANOCOBALAMIN 1000 MCG/ML IJ SOLN
1000.0000 ug | Freq: Once | INTRAMUSCULAR | Status: AC
  Administered 2020-05-02: 1000 ug via INTRAMUSCULAR

## 2020-05-02 MED ORDER — SODIUM CHLORIDE 0.9 % IV SOLN
10.0000 mg | Freq: Once | INTRAVENOUS | Status: AC
  Administered 2020-05-02: 10 mg via INTRAVENOUS
  Filled 2020-05-02: qty 10

## 2020-05-02 MED ORDER — SODIUM CHLORIDE 0.9% FLUSH
10.0000 mL | INTRAVENOUS | Status: DC | PRN
  Administered 2020-05-02: 10 mL
  Filled 2020-05-02: qty 10

## 2020-05-02 MED ORDER — SODIUM CHLORIDE 0.9 % IV SOLN
500.0000 mg/m2 | Freq: Once | INTRAVENOUS | Status: AC
  Administered 2020-05-02: 800 mg via INTRAVENOUS
  Filled 2020-05-02: qty 20

## 2020-05-02 MED ORDER — ONDANSETRON HCL 8 MG PO TABS
8.0000 mg | ORAL_TABLET | Freq: Once | ORAL | Status: AC
  Administered 2020-05-02: 8 mg via ORAL

## 2020-05-02 MED ORDER — ONDANSETRON HCL 8 MG PO TABS
ORAL_TABLET | ORAL | Status: AC
  Filled 2020-05-02: qty 1

## 2020-05-02 MED ORDER — SODIUM CHLORIDE 0.9% FLUSH
10.0000 mL | INTRAVENOUS | Status: DC | PRN
  Administered 2020-05-02: 10 mL via INTRAVENOUS
  Filled 2020-05-02: qty 10

## 2020-05-02 MED ORDER — HEPARIN SOD (PORK) LOCK FLUSH 100 UNIT/ML IV SOLN
500.0000 [IU] | Freq: Once | INTRAVENOUS | Status: AC | PRN
  Administered 2020-05-02: 500 [IU]
  Filled 2020-05-02: qty 5

## 2020-05-02 MED ORDER — CYANOCOBALAMIN 1000 MCG/ML IJ SOLN
INTRAMUSCULAR | Status: AC
  Filled 2020-05-02: qty 1

## 2020-05-02 NOTE — Patient Instructions (Signed)
[facility] Discharge Instructions for Patients Receiving Chemotherapy  Today you received the following chemotherapy agent: Pemetrexed (Alimta)  To help prevent nausea and vomiting after your treatment, we encourage you to take your nausea medication as directed by your MD.   If you develop nausea and vomiting that is not controlled by your nausea medication, call the clinic.   BELOW ARE SYMPTOMS THAT SHOULD BE REPORTED IMMEDIATELY:  *FEVER GREATER THAN 100.5 F  *CHILLS WITH OR WITHOUT FEVER  NAUSEA AND VOMITING THAT IS NOT CONTROLLED WITH YOUR NAUSEA MEDICATION  *UNUSUAL SHORTNESS OF BREATH  *UNUSUAL BRUISING OR BLEEDING  TENDERNESS IN MOUTH AND THROAT WITH OR WITHOUT PRESENCE OF ULCERS  *URINARY PROBLEMS  *BOWEL PROBLEMS  UNUSUAL RASH Items with * indicate a potential emergency and should be followed up as soon as possible.  Feel free to call the clinic should you have any questions or concerns. The clinic phone number is [phone].  Please show the [place] at check-in to the Emergency Department and triage nurse.

## 2020-05-02 NOTE — Progress Notes (Signed)
[facility] Telephone:([phone]   Fax:(336) [phone]  OFFICE PROGRESS NOTE  [name], MD [place] [address]  DIAGNOSIS: Metastatic non-small cell lung cancer initially diagnosed as locally advanced stage IIIB with a right Pancoast tumor involving the vertebral body as well as prominent canal invasion with spinal cord compression in October 2007. The patient also has metastatic disease to the brain in April 2008.  PRIOR THERAPY: 1. Status post concurrent chemoradiation with weekly carboplatin and paclitaxel, last dose was given November 18, 2005. 2. 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 3. Status post gamma knife radiotherapy to a solitary brain lesion located in the superior frontal area of the brain at [facility] in April of 2008. 4. Status post palliative radiotherapy to the lateral abdominal wall metastatic lesion under the care of Dr. Lisbeth Renshaw, completed March of 2009. 5. Status post 6 cycles of systemic chemotherapy with carboplatin and Alimta. Last dose was given July 26, 2007 with disease stabilization. 6. Gamma knife stereotactic radiotherapy to 2 brain lesions one involving the right frontal dural based as well as right parietal lesion performed on 05/07/2012 under the care of Dr. Vallarie Mare at [facility].  CURRENT THERAPY: Maintenance systemic chemotherapy with Alimta 500 MG/M2 every 3 weeks, status post 219 cycles.  INTERVAL HISTORY: Danielle [name] 70 y.o. female returns to the clinic today for follow-up visit accompanied by her daughter.  The patient is feeling fine today with no concerning complaints except for intermittent nausea with the treatment.  She denied having any current chest pain, shortness of breath, cough or hemoptysis.  She had recent low back pain.  She denied having any nausea, vomiting, diarrhea or constipation.  She  denied having any headache or visual changes.  She has no fever or chills.  She continues to tolerate her maintenance treatment with Alimta fairly well.  The patient had repeat CT scan of the chest, abdomen pelvis performed recently and she is here for evaluation and discussion of her risk her results.  MEDICAL HISTORY: Past Medical History:  Diagnosis Date  . Anemia   . 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 Carcinomas, basal cell 01/22/2019   two places on face  . CVA (cerebral vascular accident) ([place]) 04/2014   pt states had 2 cva's within 2 wks  . Diabetes mellitus without complication ([place])   . DJD (degenerative joint disease), cervical   . Fibromyalgia   . H/O: pneumonia   . History of tobacco abuse quit 10/08   on nicotine patch  . Hypertension   . Hypokalemia   . Lung cancer ([place]) dx'd 09/2005   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 had chemo and radiation for lung ca  . Thrush 12/11/2010    ALLERGIES:  is allergic to contrast media [iodinated diagnostic agents], iohexol, sulfa drugs cross reactors, and co-trimoxazole injection [sulfamethoxazole-trimethoprim].  MEDICATIONS:  Current Outpatient Medications  Medication Sig Dispense Refill  . albuterol (VENTOLIN HFA) 108 (90 Base) MCG/ACT inhaler TAKE 2 PUFFS BY MOUTH EVERY 6 HOURS AS NEEDED FOR WHEEZE OR SHORTNESS OF BREATH 18 each 2  . alendronate (FOSAMAX) 70 MG tablet Take 1 tablet (70 mg total) by mouth every 7 (seven) days. Take with a full glass of water on an empty stomach. 4 tablet 11  . amLODipine (NORVASC) 10 MG tablet TAKE 1 TABLET BY MOUTH EVERY DAY 90 tablet 2  .  aspirin 325 MG EC tablet TAKE 1 TABLET BY MOUTH EVERY DAY 90 tablet 0  . atorvastatin (LIPITOR) 40 MG tablet TAKE 1 TABLET BY MOUTH EVERY DAY AT 6 PM 90 tablet 3  . Cholecalciferol (VITAMIN D3) 50 MCG (2000 UT) TABS Take by mouth.    . diphenhydrAMINE (BENADRYL) 25 MG tablet Take 1 tablet (25 mg total) by mouth as directed. 2 hours  before CT scan. 5 tablet 0  . FeFum-FePoly-FA-B Cmp-C-Biot (INTEGRA PLUS) CAPS TAKE 1 CAPSULE BY MOUTH EVERY DAY IN THE MORNING 90 capsule 3  . fluticasone (FLONASE) 50 MCG/ACT nasal spray Place 1-2 sprays into both nostrils daily as needed. 16 g 5  . folic acid (FOLVITE) 1 MG tablet TAKE 1 TABLET BY MOUTH EVERY DAY 90 tablet 1  . furosemide (LASIX) 20 MG tablet TAKE 1 TABLET BY MOUTH EVERY DAY AS NEEDED FOR SWELLING 20 tablet 1  . lidocaine-prilocaine (EMLA) cream Apply to the Port-A-Cath site 30-60-minute before treatment. 30 g 0  . lisinopril (ZESTRIL) 20 MG tablet Take 40 mg by mouth daily.    Marland Kitchen morphine (MS CONTIN) 60 MG 12 hr tablet Take 1 tablet (60 mg total) by mouth 2 (two) times daily. 60 tablet 0  . morphine (MSIR) 30 MG tablet Take 1 tablet (30 mg total) by mouth every 4 (four) hours as needed (breakthrough pain). 60 tablet 0  . Olopatadine HCl 0.2 % SOLN INSTILL 1 DROP INTO AFFECTED EYE(S) EVERY DAY 2.5 mL 1  . ondansetron (ZOFRAN) 8 MG tablet Take 1 tablet (8 mg total) by mouth every 8 (eight) hours as needed for nausea or vomiting. 20 tablet 0  . predniSONE (DELTASONE) 50 MG tablet 1 tablet by mouth 13 hours ,7 hours and 1 hour  before CT scan 3 tablet 4  . prochlorperazine (COMPAZINE) 10 MG tablet Take 1 tablet (10 mg total) by mouth every 6 (six) hours as needed. 30 tablet 2  . triamcinolone (KENALOG) 0.025 % ointment APPLY TO AFFECTED AREA TWICE A DAY 30 g 0  . umeclidinium bromide (INCRUSE ELLIPTA) 62.5 MCG/INH AEPB Inhale 1 puff into the lungs daily. 1 each 11   No current facility-administered medications for this visit.   Facility-Administered Medications Ordered in Other Visits  Medication Dose Route Frequency Provider Last Rate Last Admin  . sodium chloride flush (NS) 0.9 % injection 10 mL  10 mL Intravenous PRN Curt Bears, MD   10 mL at 05/02/20 1042    SURGICAL HISTORY:  Past Surgical History:  Procedure Laterality Date  . CERVICAL LAMINECTOMY  1995  . KNEE  SURGERY  1990   Left x 2  . 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 LOOP RECORDER IMPLANT N/A 04/19/2014   Procedure: LOOP RECORDER IMPLANT;  Surgeon: Thompson Grayer, MD;  Location: [facility] CATH LAB;  Service: Cardiovascular;  Laterality: N/A;  . OTHER SURGICAL HISTORY  2008   Gamma knife surgery to remove brain met   . PORTACATH PLACEMENT  -[name]   TIP IN CAVOATRIAL JUNCTION  . TEE WITHOUT CARDIOVERSION N/A 04/19/2014   Procedure: TRANSESOPHAGEAL ECHOCARDIOGRAM (TEE);  Surgeon: Larey Dresser, MD;  Location: [facility];  Service: Cardiovascular;  Laterality: N/A;    REVIEW OF SYSTEMS:  Constitutional: positive for fatigue Eyes: negative Ears, nose, mouth, throat, and face: negative Respiratory: negative Cardiovascular: negative Gastrointestinal: positive for nausea Genitourinary:negative Integument/breast: negative Hematologic/lymphatic: negative Musculoskeletal:positive for back pain Neurological: negative Behavioral/Psych: negative Endocrine: negative Allergic/Immunologic: negative  PHYSICAL EXAMINATION: General appearance: alert, cooperative, fatigued and no distress Head: Normocephalic, without obvious abnormality, atraumatic Neck: no adenopathy, no JVD, supple, symmetrical, trachea midline and thyroid not enlarged, symmetric, no tenderness/mass/nodules Lymph nodes: Cervical, supraclavicular, and axillary nodes normal. Resp: clear to auscultation bilaterally Back: symmetric, no curvature. ROM normal. No CVA tenderness. Cardio: regular rate and rhythm, S1, S2 normal, no murmur, click, rub or gallop GI: soft, non-tender; bowel sounds normal; no masses,  no organomegaly Extremities: extremities normal, atraumatic, no cyanosis or edema Neurologic: Alert and oriented X 3, normal strength and tone. Normal symmetric reflexes. Normal coordination and gait  ECOG PERFORMANCE STATUS: 1 - Symptomatic but completely ambulatory  Blood pressure (!) 154/65,  pulse 90, temperature 97.6 F (36.4 C), resp. rate 20, height 5' 2.75" (1.594 m), weight 148 lb 1.6 oz (67.2 kg), SpO2 97 %.  LABORATORY DATA: Lab Results  Component Value Date   WBC 9.8 04/11/2020   HGB 11.3 (L) 04/11/2020   HCT 35.6 (L) 04/11/2020   MCV 95.7 04/11/2020   PLT 402 (H) 04/11/2020      Chemistry      Component Value Date/Time   NA 140 04/11/2020 0803   NA 142 12/24/2016 0816   K 4.1 04/11/2020 0803   K 4.4 12/24/2016 0816   CL 106 04/11/2020 0803   CL 106 06/30/2012 1004   CO2 23 04/11/2020 0803   CO2 28 12/24/2016 0816   BUN 11 04/11/2020 0803   BUN 15.8 12/24/2016 0816   CREATININE 0.81 04/11/2020 0803   CREATININE 1.1 12/24/2016 0816      Component Value Date/Time   CALCIUM 9.0 04/11/2020 0803   CALCIUM 9.6 12/24/2016 0816   ALKPHOS 75 04/11/2020 0803   ALKPHOS 80 12/24/2016 0816   AST 22 04/11/2020 0803   AST 20 12/24/2016 0816   ALT 13 04/11/2020 0803   ALT 11 12/24/2016 0816   BILITOT 0.4 04/11/2020 0803   BILITOT 0.34 12/24/2016 0816       RADIOGRAPHIC STUDIES: CT Chest W Contrast  Result Date: 04/27/2020 CLINICAL DATA:  Non-small-cell lung cancer.  Restaging. EXAM: CT CHEST, ABDOMEN, AND PELVIS WITH CONTRAST TECHNIQUE: Multidetector CT imaging of the chest, abdomen and pelvis was performed following the standard protocol during bolus administration of intravenous contrast. CONTRAST:  155m OMNIPAQUE IOHEXOL 300 MG/ML  SOLN COMPARISON:  10/15/2019 FINDINGS: CT CHEST FINDINGS Cardiovascular: The heart size is normal. No substantial pericardial effusion. Coronary artery calcification is evident. Atherosclerotic calcification is noted in the wall of the thoracic aorta. Right Port-A-Cath tip is positioned in the mid right atrium. Mediastinum/Nodes: No mediastinal lymphadenopathy. There is no hilar lymphadenopathy. The esophagus has normal imaging features. There is no axillary lymphadenopathy. Lungs/Pleura: Centrilobular and paraseptal emphysema  evident. Biapical pleuroparenchymal scarring evident post radiation scarring in the parahilar right upper lung is stable with interval resolution of some of the patchy ground-glass opacity seen in the superior segment right lower lobe previously. Atelectasis in the dependent right lung base is associated with stable appearance of right lower lobe airway impaction. Possible trace right effusion. No new suspicious pulmonary nodule or mass. No pleural effusion. Musculoskeletal: No worrisome lytic or sclerotic osseous abnormality. Compression deformity at T3 is stable. Vertebral augmentation at T4 T7 is unchanged. CT ABDOMEN PELVIS FINDINGS Hepatobiliary: No suspicious focal abnormality within the liver parenchyma. There is no evidence for gallstones, gallbladder wall thickening, or pericholecystic fluid. No intrahepatic or extrahepatic biliary dilation. Pancreas: No focal mass lesion. No dilatation of the main duct. No intraparenchymal  cyst. No peripancreatic edema. Spleen: No splenomegaly. No focal mass lesion. Adrenals/Urinary Tract: No adrenal nodule or mass. Kidneys unremarkable. No evidence for hydroureter. The urinary bladder appears normal for the degree of distention. Stomach/Bowel: Stomach is unremarkable. No gastric wall thickening. No evidence of outlet obstruction. Duodenum is normally positioned as is the ligament of Treitz. No small bowel wall thickening. No small bowel dilatation. The terminal ileum is normal. The appendix is normal. No gross colonic mass. No colonic wall thickening. Vascular/Lymphatic: There is abdominal aortic atherosclerosis without aneurysm. There is no gastrohepatic or hepatoduodenal ligament lymphadenopathy. No retroperitoneal or mesenteric lymphadenopathy. No pelvic sidewall lymphadenopathy. Reproductive: The uterus is unremarkable.  There is no adnexal mass. Other: No intraperitoneal free fluid. Musculoskeletal: No worrisome lytic or sclerotic osseous abnormality. Mild changes of  avascular necrosis noted in both femoral heads without collapse. IMPRESSION: 1. Stable exam. No new or progressive findings to suggest recurrent or metastatic disease in the chest, abdomen, or pelvis. 2. Stable post radiation scarring in the parahilar right upper lung. 3. 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 4. Stable marked compression deformity at T3 with prior vertebral augmentation at T4 and T7. 5. Aortic Atherosclerosis (ICD10-I70.0) and Emphysema (ICD10-J43.9). Electronically Signed   By: Misty Stanley M.D.   On: 04/27/2020 16:41   CT Abdomen Pelvis W Contrast  Result Date: 04/27/2020 CLINICAL DATA:  Non-small-cell lung cancer.  Restaging. EXAM: CT CHEST, ABDOMEN, AND PELVIS WITH CONTRAST TECHNIQUE: Multidetector CT imaging of the chest, abdomen and pelvis was performed following the standard protocol during bolus administration of intravenous contrast. CONTRAST:  165m OMNIPAQUE IOHEXOL 300 MG/ML  SOLN COMPARISON:  10/15/2019 FINDINGS: CT CHEST FINDINGS Cardiovascular: The heart size is normal. No substantial pericardial effusion. Coronary artery calcification is evident. Atherosclerotic calcification is noted in the wall of the thoracic aorta. Right Port-A-Cath tip is positioned in the mid right atrium. Mediastinum/Nodes: No mediastinal lymphadenopathy. There is no hilar lymphadenopathy. The esophagus has normal imaging features. There is no axillary lymphadenopathy. Lungs/Pleura: Centrilobular and paraseptal emphysema evident. Biapical pleuroparenchymal scarring evident post radiation scarring in the parahilar right upper lung is stable with interval resolution of some of the patchy ground-glass opacity seen in the superior segment right lower lobe previously. Atelectasis in the dependent right lung base is associated with stable appearance of right lower lobe airway impaction. Possible trace right effusion. No new suspicious pulmonary nodule or mass. No pleural  effusion. Musculoskeletal: No worrisome lytic or sclerotic osseous abnormality. Compression deformity at T3 is stable. Vertebral augmentation at T4 T7 is unchanged. CT ABDOMEN PELVIS FINDINGS Hepatobiliary: No suspicious focal abnormality within the liver parenchyma. There is no evidence for gallstones, gallbladder wall thickening, or pericholecystic fluid. No intrahepatic or extrahepatic biliary dilation. Pancreas: No focal mass lesion. No dilatation of the main duct. No intraparenchymal cyst. No peripancreatic edema. Spleen: No splenomegaly. No focal mass lesion. Adrenals/Urinary Tract: No adrenal nodule or mass. Kidneys unremarkable. No evidence for hydroureter. The urinary bladder appears normal for the degree of distention. Stomach/Bowel: Stomach is unremarkable. No gastric wall thickening. No evidence of outlet obstruction. Duodenum is normally positioned as is the ligament of Treitz. No small bowel wall thickening. No small bowel dilatation. The terminal ileum is normal. The appendix is normal. No gross colonic mass. No colonic wall thickening. Vascular/Lymphatic: There is abdominal aortic atherosclerosis without aneurysm. There is no gastrohepatic or hepatoduodenal ligament lymphadenopathy. No retroperitoneal or mesenteric lymphadenopathy. No pelvic sidewall lymphadenopathy. Reproductive: The uterus is unremarkable.  There is  no adnexal mass. Other: No intraperitoneal free fluid. Musculoskeletal: No worrisome lytic or sclerotic osseous abnormality. 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 IMPRESSION: 1. Stable exam. No new or progressive findings to suggest recurrent or metastatic disease in the chest, abdomen, or pelvis. 2. Stable post radiation scarring in the parahilar right upper lung. 3. 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 4. Stable marked compression deformity at T3 with prior vertebral augmentation at T4 and T7. 5. Aortic  Atherosclerosis (ICD10-I70.0) and Emphysema (ICD10-J43.9). Electronically Signed   By: Misty Stanley M.D.   On: 04/27/2020 16:41    ASSESSMENT AND PLAN: This is a very pleasant 70 years old white female with metastatic non-small cell lung cancer diagnosed in October 2007 status post concurrent chemoradiation followed by consolidation chemotherapy and she is currently on maintenance treatment with single agent Alimta status post 219 cycles. The patient continues to tolerate her treatment fairly well with no concerning adverse effect except for mild fatigue and occasional nausea. She had repeat CT scan of the chest, abdomen pelvis performed recently.  I personally and independently reviewed the scans and discussed the results with the patient and her daughter. Her scan showed no concerning findings for disease progression. I recommended for her to continue her maintenance treatment with single agent Alimta and she will proceed with cycle #220 today. For pain management the patient will continue her current treatment with MS Contin and morphine sulfate as needed. The patient will come back for follow-up visit in 3 weeks for evaluation before the next cycle of her treatment. She was advised to call immediately if she has any other concerning symptoms in the interval.  The patient voices understanding of current disease status and treatment options and is in agreement with the current care plan. All questions were answered. The patient knows to call the clinic with any problems, questions or concerns. We can certainly see the patient much sooner if necessary.  Disclaimer: This note was dictated with voice recognition software. Similar sounding words can inadvertently be transcribed and may not be corrected upon review.

## 2020-05-03 ENCOUNTER — Other Ambulatory Visit: Payer: Self-pay | Admitting: Family Medicine

## 2020-05-03 ENCOUNTER — Other Ambulatory Visit: Payer: Self-pay | Admitting: Internal Medicine

## 2020-05-23 ENCOUNTER — Other Ambulatory Visit: Payer: Medicare (Managed Care)

## 2020-05-23 ENCOUNTER — Inpatient Hospital Stay: Payer: Medicare (Managed Care)

## 2020-05-23 ENCOUNTER — Encounter: Payer: Self-pay | Admitting: Internal Medicine

## 2020-05-23 ENCOUNTER — Other Ambulatory Visit: Payer: Self-pay

## 2020-05-23 ENCOUNTER — Inpatient Hospital Stay: Payer: Medicare (Managed Care) | Attending: Physician Assistant | Admitting: Internal Medicine

## 2020-05-23 VITALS — BP 158/68 | HR 96 | Temp 98.0°F | Resp 16 | Ht 62.75 in | Wt 149.7 lb

## 2020-05-23 DIAGNOSIS — C7931 Secondary malignant neoplasm of brain: Secondary | ICD-10-CM

## 2020-05-23 DIAGNOSIS — Z5111 Encounter for antineoplastic chemotherapy: Secondary | ICD-10-CM | POA: Diagnosis not present

## 2020-05-23 DIAGNOSIS — Z79899 Other long term (current) drug therapy: Secondary | ICD-10-CM | POA: Insufficient documentation

## 2020-05-23 DIAGNOSIS — D6481 Anemia due to antineoplastic chemotherapy: Secondary | ICD-10-CM

## 2020-05-23 DIAGNOSIS — Z7983 Long term (current) use of bisphosphonates: Secondary | ICD-10-CM | POA: Diagnosis not present

## 2020-05-23 DIAGNOSIS — Z8673 Personal history of transient ischemic attack (TIA), and cerebral infarction without residual deficits: Secondary | ICD-10-CM | POA: Insufficient documentation

## 2020-05-23 DIAGNOSIS — T451X5A Adverse effect of antineoplastic and immunosuppressive drugs, initial encounter: Secondary | ICD-10-CM | POA: Diagnosis not present

## 2020-05-23 DIAGNOSIS — Z7982 Long term (current) use of aspirin: Secondary | ICD-10-CM | POA: Diagnosis not present

## 2020-05-23 DIAGNOSIS — C3411 Malignant neoplasm of upper lobe, right bronchus or lung: Secondary | ICD-10-CM

## 2020-05-23 DIAGNOSIS — C3410 Malignant neoplasm of upper lobe, unspecified bronchus or lung: Secondary | ICD-10-CM | POA: Insufficient documentation

## 2020-05-23 DIAGNOSIS — I10 Essential (primary) hypertension: Secondary | ICD-10-CM | POA: Diagnosis not present

## 2020-05-23 DIAGNOSIS — Z923 Personal history of irradiation: Secondary | ICD-10-CM | POA: Insufficient documentation

## 2020-05-23 DIAGNOSIS — M47812 Spondylosis without myelopathy or radiculopathy, cervical region: Secondary | ICD-10-CM | POA: Insufficient documentation

## 2020-05-23 DIAGNOSIS — E119 Type 2 diabetes mellitus without complications: Secondary | ICD-10-CM | POA: Insufficient documentation

## 2020-05-23 DIAGNOSIS — M549 Dorsalgia, unspecified: Secondary | ICD-10-CM | POA: Diagnosis not present

## 2020-05-23 DIAGNOSIS — G8929 Other chronic pain: Secondary | ICD-10-CM | POA: Insufficient documentation

## 2020-05-23 LAB — CBC WITH DIFFERENTIAL/PLATELET
Abs Immature Granulocytes: 0.04 10*3/uL (ref 0.00–0.07)
Basophils Absolute: 0.1 10*3/uL (ref 0.0–0.1)
Basophils Relative: 1 %
Eosinophils Absolute: 0 10*3/uL (ref 0.0–0.5)
Eosinophils Relative: 0 %
HCT: 37.9 % (ref 36.0–46.0)
Hemoglobin: 12.1 g/dL (ref 12.0–15.0)
Immature Granulocytes: 0 %
Lymphocytes Relative: 21 %
Lymphs Abs: 2.3 10*3/uL (ref 0.7–4.0)
MCH: 30.1 pg (ref 26.0–34.0)
MCHC: 31.9 g/dL (ref 30.0–36.0)
MCV: 94.3 fL (ref 80.0–100.0)
Monocytes Absolute: 1.2 10*3/uL — ABNORMAL HIGH (ref 0.1–1.0)
Monocytes Relative: 11 %
Neutro Abs: 7.5 10*3/uL (ref 1.7–7.7)
Neutrophils Relative %: 67 %
Platelets: 450 10*3/uL — ABNORMAL HIGH (ref 150–400)
RBC: 4.02 MIL/uL (ref 3.87–5.11)
RDW: 14.9 % (ref 11.5–15.5)
WBC: 11.2 10*3/uL — ABNORMAL HIGH (ref 4.0–10.5)
nRBC: 0 % (ref 0.0–0.2)

## 2020-05-23 LAB — COMPREHENSIVE METABOLIC PANEL
ALT: 10 U/L (ref 0–44)
AST: 23 U/L (ref 15–41)
Albumin: 3.6 g/dL (ref 3.5–5.0)
Alkaline Phosphatase: 93 U/L (ref 38–126)
Anion gap: 11 (ref 5–15)
BUN: 16 mg/dL (ref 8–23)
CO2: 25 mmol/L (ref 22–32)
Calcium: 10.1 mg/dL (ref 8.9–10.3)
Chloride: 106 mmol/L (ref 98–111)
Creatinine, Ser: 0.85 mg/dL (ref 0.44–1.00)
GFR, Estimated: >60 mL/min (ref >60)
Glucose, Bld: 131 mg/dL — ABNORMAL HIGH (ref 70–99)
Potassium: 4.4 mmol/L (ref 3.5–5.1)
Sodium: 142 mmol/L (ref 135–145)
Total Bilirubin: 0.4 mg/dL (ref 0.3–1.2)
Total Protein: 7.6 g/dL (ref 6.5–8.1)

## 2020-05-23 MED ORDER — ONDANSETRON HCL 8 MG PO TABS
8.0000 mg | ORAL_TABLET | Freq: Once | ORAL | Status: AC
  Administered 2020-05-23: 8 mg via ORAL

## 2020-05-23 MED ORDER — SODIUM CHLORIDE 0.9 % IV SOLN
10.0000 mg | Freq: Once | INTRAVENOUS | Status: AC
  Administered 2020-05-23: 10 mg via INTRAVENOUS
  Filled 2020-05-23: qty 10

## 2020-05-23 MED ORDER — HEPARIN SOD (PORK) LOCK FLUSH 100 UNIT/ML IV SOLN
500.0000 [IU] | Freq: Once | INTRAVENOUS | Status: AC | PRN
  Administered 2020-05-23: 500 [IU]
  Filled 2020-05-23: qty 5

## 2020-05-23 MED ORDER — SODIUM CHLORIDE 0.9 % IV SOLN
500.0000 mg/m2 | Freq: Once | INTRAVENOUS | Status: AC
  Administered 2020-05-23: 800 mg via INTRAVENOUS
  Filled 2020-05-23: qty 12

## 2020-05-23 MED ORDER — SODIUM CHLORIDE 0.9 % IV SOLN
Freq: Once | INTRAVENOUS | Status: AC
Start: 2020-05-23 — End: 2020-05-23
  Filled 2020-05-23: qty 250

## 2020-05-23 MED ORDER — SODIUM CHLORIDE 0.9% FLUSH
10.0000 mL | INTRAVENOUS | Status: DC | PRN
  Administered 2020-05-23: 10 mL
  Filled 2020-05-23: qty 10

## 2020-05-23 MED ORDER — ONDANSETRON HCL 8 MG PO TABS
ORAL_TABLET | ORAL | Status: AC
  Filled 2020-05-23: qty 1

## 2020-05-23 NOTE — Patient Instructions (Signed)
[facility]  Discharge Instructions: Thank you for choosing [facility] to provide your oncology and hematology care.   If you have a lab appointment with the [facility], please go directly to the [facility] and check in at the registration area.   Wear comfortable clothing and clothing appropriate for easy access to any Portacath or PICC line.   We strive to give you quality time with your provider. You may need to reschedule your appointment if you arrive late (15 or more minutes).  Arriving late affects you and other patients whose appointments are after yours.  Also, if you miss three or more appointments without notifying the office, you may be dismissed from the clinic at the provider's discretion.      For prescription refill requests, have your pharmacy contact our office and allow 72 hours for refills to be completed.    Today you received the following chemotherapy and/or immunotherapy agents: Alimta.      To help prevent nausea and vomiting after your treatment, we encourage you to take your nausea medication as directed.  BELOW ARE SYMPTOMS THAT SHOULD BE REPORTED IMMEDIATELY: . *FEVER GREATER THAN 100.4 F (38 C) OR HIGHER . *CHILLS OR SWEATING . *NAUSEA AND VOMITING THAT IS NOT CONTROLLED WITH YOUR NAUSEA MEDICATION . *UNUSUAL SHORTNESS OF BREATH . *UNUSUAL BRUISING OR BLEEDING . *URINARY PROBLEMS (pain or burning when urinating, or frequent urination) . *BOWEL PROBLEMS (unusual diarrhea, constipation, pain near the anus) . TENDERNESS IN MOUTH AND THROAT WITH OR WITHOUT PRESENCE OF ULCERS (sore throat, sores in mouth, or a toothache) . UNUSUAL RASH, SWELLING OR PAIN  . UNUSUAL VAGINAL DISCHARGE OR ITCHING   Items with * indicate a potential emergency and should be followed up as soon as possible or go to the Emergency Department if any problems should occur.  Please show the CHEMOTHERAPY ALERT CARD or IMMUNOTHERAPY ALERT  CARD at check-in to the Emergency Department and triage nurse.  Should you have questions after your visit or need to cancel or reschedule your appointment, please contact [facility]  Dept: [phone]  and follow the prompts.  Office hours are 8:00 a.m. to 4:30 p.m. Monday - Friday. Please note that voicemails left after 4:00 p.m. may not be returned until the following business day.  We are closed weekends and major holidays. You have access to a nurse at all times for urgent questions. Please call the main number to the clinic Dept: [phone] and follow the prompts.   For any non-urgent questions, you may also contact your provider using MyChart. We now offer e-Visits for anyone 15 and older to request care online for non-urgent symptoms. For details visit [email or web address].   Also download the MyChart app! Go to the app store, search "MyChart", open the app, select [facility], and log in with your MyChart username and password.  Due to Covid, a mask is required upon entering the hospital/clinic. If you do not have a mask, one will be given to you upon arrival. For doctor visits, patients may have 1 support person aged 42 or older with them. For treatment visits, patients cannot have anyone with them due to current Covid guidelines and our immunocompromised population.

## 2020-05-23 NOTE — Progress Notes (Signed)
[place] Telephone:([phone]) [phone]   Fax:(336) [phone]  OFFICE PROGRESS NOTE  [name], MD [place] [address]  DIAGNOSIS: Metastatic non-small cell lung cancer initially diagnosed as locally advanced stage IIIB with a right Pancoast tumor involving the vertebral body as well as prominent canal invasion with spinal cord compression in October 2007. The patient also has metastatic disease to the brain in April 2008.  PRIOR THERAPY: 1. Status post concurrent chemoradiation with weekly carboplatin and paclitaxel, last dose was given November 18, 2005. 2. 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 3. Status post gamma knife radiotherapy to a solitary brain lesion located in the superior frontal area of the brain at [facility] in April of 2008. 4. Status post palliative radiotherapy to the lateral abdominal wall metastatic lesion under the care of Dr. Lisbeth Renshaw, completed March of 2009. 5. Status post 6 cycles of systemic chemotherapy with carboplatin and Alimta. Last dose was given July 26, 2007 with disease stabilization. 6. Gamma knife stereotactic radiotherapy to 2 brain lesions one involving the right frontal dural based as well as right parietal lesion performed on 05/07/2012 under the care of Dr. Vallarie Mare at [facility].  CURRENT THERAPY: Maintenance systemic chemotherapy with Alimta 500 MG/M2 every 3 weeks, status post 220 cycles.  INTERVAL HISTORY: Danielle [name] 70 y.o. female returns to the clinic today for follow-up visit.  The patient is feeling fine today with no concerning complaints except for the chronic back pain.  Her nausea is much better.  She denied having any chest pain, shortness of breath, cough or hemoptysis.  She denied having any fever or chills.  She has no nausea, vomiting, diarrhea or constipation.  The patient has no weight loss or  night sweats.  She continues to tolerate her treatment with maintenance Alimta fairly well.  She is here today for evaluation before starting cycle #221.   MEDICAL HISTORY: Past Medical History:  Diagnosis Date  . Anemia   . 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 Carcinomas, basal cell 01/22/2019   two places on face  . CVA (cerebral vascular accident) ([facility]) 04/2014   pt states had 2 cva's within 2 wks  . Diabetes mellitus without complication ([place])   . DJD (degenerative joint disease), cervical   . Fibromyalgia   . H/O: pneumonia   . History of tobacco abuse quit 10/08   on nicotine patch  . Hypertension   . Hypokalemia   . Lung cancer ([place]) dx'd 09/2005   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 had chemo and radiation for lung ca  . Thrush 12/11/2010    ALLERGIES:  is allergic to contrast media [iodinated diagnostic agents], iohexol, sulfa drugs cross reactors, and co-trimoxazole injection [sulfamethoxazole-trimethoprim].  MEDICATIONS:  Current Outpatient Medications  Medication Sig Dispense Refill  . albuterol (VENTOLIN HFA) 108 (90 Base) MCG/ACT inhaler TAKE 2 PUFFS BY MOUTH EVERY 6 HOURS AS NEEDED FOR WHEEZE OR SHORTNESS OF BREATH 18 each 2  . alendronate (FOSAMAX) 70 MG tablet Take 1 tablet (70 mg total) by mouth every 7 (seven) days. Take with a full glass of water on an empty stomach. 4 tablet 11  . amLODipine (NORVASC) 10 MG tablet TAKE 1 TABLET BY MOUTH EVERY DAY 90 tablet 2  . aspirin 325 MG EC tablet TAKE 1 TABLET BY MOUTH EVERY DAY 90 tablet 0  . atorvastatin (LIPITOR) 40  MG tablet TAKE 1 TABLET BY MOUTH EVERY DAY AT 6 PM 90 tablet 3  . Cholecalciferol (VITAMIN D3) 50 MCG (2000 UT) TABS Take by mouth.    . diphenhydrAMINE (BENADRYL) 25 MG tablet Take 1 tablet (25 mg total) by mouth as directed. 2 hours before CT scan. 5 tablet 0  . FeFum-FePoly-FA-B Cmp-C-Biot (INTEGRA PLUS) CAPS TAKE 1 CAPSULE BY MOUTH EVERY DAY IN THE MORNING 90 capsule 3  .  fluticasone (FLONASE) 50 MCG/ACT nasal spray Place 1-2 sprays into both nostrils daily as needed. 16 g 5  . folic acid (FOLVITE) 1 MG tablet TAKE 1 TABLET BY MOUTH EVERY DAY 90 tablet 1  . furosemide (LASIX) 20 MG tablet TAKE 1 TABLET BY MOUTH EVERY DAY AS NEEDED FOR SWELLING 20 tablet 1  . lidocaine-prilocaine (EMLA) cream Apply to the Port-A-Cath site 30-60-minute before treatment. 30 g 0  . lisinopril (ZESTRIL) 20 MG tablet Take 40 mg by mouth daily.    Marland Kitchen morphine (MS CONTIN) 60 MG 12 hr tablet Take 1 tablet (60 mg total) by mouth 2 (two) times daily. 60 tablet 0  . morphine (MSIR) 30 MG tablet Take 1 tablet (30 mg total) by mouth every 4 (four) hours as needed (breakthrough pain). 60 tablet 0  . Olopatadine HCl 0.2 % SOLN INSTILL 1 DROP INTO AFFECTED EYE(S) EVERY DAY 2.5 mL 1  . ondansetron (ZOFRAN) 8 MG tablet TAKE 1 TABLET BY MOUTH EVERY 8 HOURS AS NEEDED FOR NAUSEA OR VOMITING. 20 tablet 0  . predniSONE (DELTASONE) 50 MG tablet 1 tablet by mouth 13 hours ,7 hours and 1 hour  before CT scan 3 tablet 4  . prochlorperazine (COMPAZINE) 10 MG tablet Take 1 tablet (10 mg total) by mouth every 6 (six) hours as needed. 30 tablet 2  . triamcinolone (KENALOG) 0.025 % ointment APPLY TO AFFECTED AREA TWICE A DAY 30 g 0  . umeclidinium bromide (INCRUSE ELLIPTA) 62.5 MCG/INH AEPB Inhale 1 puff into the lungs daily. 1 each 11   No current facility-administered medications for this visit.    SURGICAL HISTORY:  Past Surgical History:  Procedure Laterality Date  . CERVICAL LAMINECTOMY  1995  . KNEE SURGERY  1990   Left x 2  . 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 LOOP RECORDER IMPLANT N/A 04/19/2014   Procedure: LOOP RECORDER IMPLANT;  Surgeon: Thompson Grayer, MD;  Location: [facility] CATH LAB;  Service: Cardiovascular;  Laterality: N/A;  . OTHER SURGICAL HISTORY  2008   Gamma knife surgery to remove brain met   . PORTACATH PLACEMENT  -[name]   TIP IN CAVOATRIAL JUNCTION  .  TEE WITHOUT CARDIOVERSION N/A 04/19/2014   Procedure: TRANSESOPHAGEAL ECHOCARDIOGRAM (TEE);  Surgeon: Larey Dresser, MD;  Location: [place];  Service: Cardiovascular;  Laterality: N/A;    REVIEW OF SYSTEMS:  A comprehensive review of systems was negative except for: Constitutional: positive for fatigue   PHYSICAL EXAMINATION: General appearance: alert, cooperative, fatigued and no distress Head: Normocephalic, without obvious abnormality, atraumatic Neck: no adenopathy, no JVD, supple, symmetrical, trachea midline and thyroid not enlarged, symmetric, no tenderness/mass/nodules Lymph nodes: Cervical, supraclavicular, and axillary nodes normal. Resp: clear to auscultation bilaterally Back: symmetric, no curvature. ROM normal. No CVA tenderness. Cardio: regular rate and rhythm, S1, S2 normal, no murmur, click, rub or gallop GI: soft, non-tender; bowel sounds normal; no masses,  no organomegaly Extremities: extremities normal, atraumatic, no cyanosis or edema  ECOG PERFORMANCE STATUS:  1 - Symptomatic but completely ambulatory  Blood pressure (!) 158/68, pulse 96, temperature 98 F (36.7 C), temperature source Tympanic, resp. rate 16, height 5' 2.75" (1.594 m), weight 149 lb 11.2 oz (67.9 kg), SpO2 98 %.  LABORATORY DATA: Lab Results  Component Value Date   WBC 11.2 (H) 05/23/2020   HGB 12.1 05/23/2020   HCT 37.9 05/23/2020   MCV 94.3 05/23/2020   PLT 450 (H) 05/23/2020      Chemistry      Component Value Date/Time   NA 142 05/02/2020 1036   NA 142 12/24/2016 0816   K 4.0 05/02/2020 1036   K 4.4 12/24/2016 0816   CL 107 05/02/2020 1036   CL 106 06/30/2012 1004   CO2 26 05/02/2020 1036   CO2 28 12/24/2016 0816   BUN 8 05/02/2020 1036   BUN 15.8 12/24/2016 0816   CREATININE 0.82 05/02/2020 1036   CREATININE 1.1 12/24/2016 0816      Component Value Date/Time   CALCIUM 9.5 05/02/2020 1036   CALCIUM 9.6 12/24/2016 0816   ALKPHOS 81 05/02/2020 1036   ALKPHOS 80  12/24/2016 0816   AST 24 05/02/2020 1036   AST 20 12/24/2016 0816   ALT 10 05/02/2020 1036   ALT 11 12/24/2016 0816   BILITOT 0.4 05/02/2020 1036   BILITOT 0.34 12/24/2016 0816       RADIOGRAPHIC STUDIES: CT Chest W Contrast  Result Date: 04/27/2020 CLINICAL DATA:  Non-small-cell lung cancer.  Restaging. EXAM: CT CHEST, ABDOMEN, AND PELVIS WITH CONTRAST TECHNIQUE: Multidetector CT imaging of the chest, abdomen and pelvis was performed following the standard protocol during bolus administration of intravenous contrast. CONTRAST:  150m OMNIPAQUE IOHEXOL 300 MG/ML  SOLN COMPARISON:  10/15/2019 FINDINGS: CT CHEST FINDINGS Cardiovascular: The heart size is normal. No substantial pericardial effusion. Coronary artery calcification is evident. Atherosclerotic calcification is noted in the wall of the thoracic aorta. Right Port-A-Cath tip is positioned in the mid right atrium. Mediastinum/Nodes: No mediastinal lymphadenopathy. There is no hilar lymphadenopathy. The esophagus has normal imaging features. There is no axillary lymphadenopathy. Lungs/Pleura: Centrilobular and paraseptal emphysema evident. Biapical pleuroparenchymal scarring evident post radiation scarring in the parahilar right upper lung is stable with interval resolution of some of the patchy ground-glass opacity seen in the superior segment right lower lobe previously. Atelectasis in the dependent right lung base is associated with stable appearance of right lower lobe airway impaction. Possible trace right effusion. No new suspicious pulmonary nodule or mass. No pleural effusion. Musculoskeletal: No worrisome lytic or sclerotic osseous abnormality. Compression deformity at T3 is stable. Vertebral augmentation at T4 T7 is unchanged. CT ABDOMEN PELVIS FINDINGS Hepatobiliary: No suspicious focal abnormality within the liver parenchyma. There is no evidence for gallstones, gallbladder wall thickening, or pericholecystic fluid. No intrahepatic or  extrahepatic biliary dilation. Pancreas: No focal mass lesion. No dilatation of the main duct. No intraparenchymal cyst. No peripancreatic edema. Spleen: No splenomegaly. No focal mass lesion. Adrenals/Urinary Tract: No adrenal nodule or mass. Kidneys unremarkable. No evidence for hydroureter. The urinary bladder appears normal for the degree of distention. Stomach/Bowel: Stomach is unremarkable. No gastric wall thickening. No evidence of outlet obstruction. Duodenum is normally positioned as is the ligament of Treitz. No small bowel wall thickening. No small bowel dilatation. The terminal ileum is normal. The appendix is normal. No gross colonic mass. No colonic wall thickening. Vascular/Lymphatic: There is abdominal aortic atherosclerosis without aneurysm. There is no gastrohepatic or hepatoduodenal ligament lymphadenopathy. No retroperitoneal or mesenteric lymphadenopathy. No pelvic  sidewall lymphadenopathy. Reproductive: The uterus is unremarkable.  There is no adnexal mass. Other: No intraperitoneal free fluid. Musculoskeletal: No worrisome lytic or sclerotic osseous abnormality. 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 IMPRESSION: 1. Stable exam. No new or progressive findings to suggest recurrent or metastatic disease in the chest, abdomen, or pelvis. 2. Stable post radiation scarring in the parahilar right upper lung. 3. 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 4. Stable marked compression deformity at T3 with prior vertebral augmentation at T4 and T7. 5. Aortic Atherosclerosis (ICD10-I70.0) and Emphysema (ICD10-J43.9). Electronically Signed   By: Misty Stanley M.D.   On: 04/27/2020 16:41   CT Abdomen Pelvis W Contrast  Result Date: 04/27/2020 CLINICAL DATA:  Non-small-cell lung cancer.  Restaging. EXAM: CT CHEST, ABDOMEN, AND PELVIS WITH CONTRAST TECHNIQUE: Multidetector CT imaging of the chest, abdomen and pelvis was performed  following the standard protocol during bolus administration of intravenous contrast. CONTRAST:  147m OMNIPAQUE IOHEXOL 300 MG/ML  SOLN COMPARISON:  10/15/2019 FINDINGS: CT CHEST FINDINGS Cardiovascular: The heart size is normal. No substantial pericardial effusion. Coronary artery calcification is evident. Atherosclerotic calcification is noted in the wall of the thoracic aorta. Right Port-A-Cath tip is positioned in the mid right atrium. Mediastinum/Nodes: No mediastinal lymphadenopathy. There is no hilar lymphadenopathy. The esophagus has normal imaging features. There is no axillary lymphadenopathy. Lungs/Pleura: Centrilobular and paraseptal emphysema evident. Biapical pleuroparenchymal scarring evident post radiation scarring in the parahilar right upper lung is stable with interval resolution of some of the patchy ground-glass opacity seen in the superior segment right lower lobe previously. Atelectasis in the dependent right lung base is associated with stable appearance of right lower lobe airway impaction. Possible trace right effusion. No new suspicious pulmonary nodule or mass. No pleural effusion. Musculoskeletal: No worrisome lytic or sclerotic osseous abnormality. Compression deformity at T3 is stable. Vertebral augmentation at T4 T7 is unchanged. CT ABDOMEN PELVIS FINDINGS Hepatobiliary: No suspicious focal abnormality within the liver parenchyma. There is no evidence for gallstones, gallbladder wall thickening, or pericholecystic fluid. No intrahepatic or extrahepatic biliary dilation. Pancreas: No focal mass lesion. No dilatation of the main duct. No intraparenchymal cyst. No peripancreatic edema. Spleen: No splenomegaly. No focal mass lesion. Adrenals/Urinary Tract: No adrenal nodule or mass. Kidneys unremarkable. No evidence for hydroureter. The urinary bladder appears normal for the degree of distention. Stomach/Bowel: Stomach is unremarkable. No gastric wall thickening. No evidence of outlet  obstruction. Duodenum is normally positioned as is the ligament of Treitz. No small bowel wall thickening. No small bowel dilatation. The terminal ileum is normal. The appendix is normal. No gross colonic mass. No colonic wall thickening. Vascular/Lymphatic: There is abdominal aortic atherosclerosis without aneurysm. There is no gastrohepatic or hepatoduodenal ligament lymphadenopathy. No retroperitoneal or mesenteric lymphadenopathy. No pelvic sidewall lymphadenopathy. Reproductive: The uterus is unremarkable.  There is no adnexal mass. Other: No intraperitoneal free fluid. Musculoskeletal: No worrisome lytic or sclerotic osseous abnormality. 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 IMPRESSION: 1. Stable exam. No new or progressive findings to suggest recurrent or metastatic disease in the chest, abdomen, or pelvis. 2. Stable post radiation scarring in the parahilar right upper lung. 3. 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 4. Stable marked compression deformity at T3 with prior vertebral augmentation at T4 and T7. 5. Aortic Atherosclerosis (ICD10-I70.0) and Emphysema (ICD10-J43.9). Electronically Signed   By: EMisty StanleyM.D.   On:  04/27/2020 16:41    ASSESSMENT AND PLAN: This is a very pleasant 70 years old white female with metastatic non-small cell lung cancer diagnosed in October 2007 status post concurrent chemoradiation followed by consolidation chemotherapy and she is currently on maintenance treatment with single agent Alimta status post 220 cycles. The patient continues to tolerate this treatment well with no concerning adverse effects. I recommended for her to proceed with cycle #221 today as planned. For pain management the patient will continue her current treatment with MS Contin and morphine sulfate as needed. She will come back for follow-up visit in 3 weeks for evaluation before the next cycle of her  treatment. The patient was advised to call immediately if she has any concerning symptoms in the interval.  The patient voices understanding of current disease status and treatment options and is in agreement with the current care plan. All questions were answered. The patient knows to call the clinic with any problems, questions or concerns. We can certainly see the patient much sooner if necessary.  Disclaimer: This note was dictated with voice recognition software. Similar sounding words can inadvertently be transcribed and may not be corrected upon review.

## 2020-05-25 ENCOUNTER — Telehealth: Payer: Self-pay | Admitting: Internal Medicine

## 2020-05-25 NOTE — Telephone Encounter (Signed)
Scheduled per los. Called, not able to leave msg. Mailed printout  

## 2020-05-26 ENCOUNTER — Telehealth: Payer: Self-pay

## 2020-05-26 ENCOUNTER — Other Ambulatory Visit: Payer: Self-pay | Admitting: Physician Assistant

## 2020-05-26 DIAGNOSIS — C3490 Malignant neoplasm of unspecified part of unspecified bronchus or lung: Secondary | ICD-10-CM

## 2020-05-26 DIAGNOSIS — C3410 Malignant neoplasm of upper lobe, unspecified bronchus or lung: Secondary | ICD-10-CM

## 2020-05-26 MED ORDER — MORPHINE SULFATE 30 MG PO TABS
30.0000 mg | ORAL_TABLET | ORAL | 0 refills | Status: DC | PRN
Start: 2020-05-26 — End: 2020-06-26

## 2020-05-26 MED ORDER — MORPHINE SULFATE ER 60 MG PO TBCR: 60.0000 mg | EXTENDED_RELEASE_TABLET | Freq: Two times a day (BID) | ORAL | 0 refills | Status: DC

## 2020-05-26 NOTE — Telephone Encounter (Signed)
Pt requests a refill of MS Contin 60mg  and Morphine 30mg .

## 2020-05-29 ENCOUNTER — Other Ambulatory Visit: Payer: Self-pay | Admitting: Internal Medicine

## 2020-05-29 DIAGNOSIS — C3411 Malignant neoplasm of upper lobe, right bronchus or lung: Secondary | ICD-10-CM

## 2020-05-29 DIAGNOSIS — C7931 Secondary malignant neoplasm of brain: Secondary | ICD-10-CM

## 2020-06-13 ENCOUNTER — Inpatient Hospital Stay: Payer: Medicare (Managed Care) | Attending: Physician Assistant | Admitting: Internal Medicine

## 2020-06-13 ENCOUNTER — Inpatient Hospital Stay: Payer: Medicare (Managed Care)

## 2020-06-13 ENCOUNTER — Other Ambulatory Visit: Payer: Medicare (Managed Care)

## 2020-06-13 ENCOUNTER — Other Ambulatory Visit: Payer: Self-pay

## 2020-06-13 ENCOUNTER — Encounter: Payer: Self-pay | Admitting: Internal Medicine

## 2020-06-13 VITALS — BP 131/59 | HR 95 | Temp 97.4°F | Resp 19 | Ht 62.75 in | Wt 147.2 lb

## 2020-06-13 DIAGNOSIS — Z8673 Personal history of transient ischemic attack (TIA), and cerebral infarction without residual deficits: Secondary | ICD-10-CM | POA: Diagnosis not present

## 2020-06-13 DIAGNOSIS — Z5111 Encounter for antineoplastic chemotherapy: Secondary | ICD-10-CM | POA: Insufficient documentation

## 2020-06-13 DIAGNOSIS — C3411 Malignant neoplasm of upper lobe, right bronchus or lung: Secondary | ICD-10-CM | POA: Diagnosis not present

## 2020-06-13 DIAGNOSIS — C7931 Secondary malignant neoplasm of brain: Secondary | ICD-10-CM | POA: Insufficient documentation

## 2020-06-13 DIAGNOSIS — Z95828 Presence of other vascular implants and grafts: Secondary | ICD-10-CM

## 2020-06-13 DIAGNOSIS — C3490 Malignant neoplasm of unspecified part of unspecified bronchus or lung: Secondary | ICD-10-CM

## 2020-06-13 DIAGNOSIS — Z7982 Long term (current) use of aspirin: Secondary | ICD-10-CM | POA: Insufficient documentation

## 2020-06-13 DIAGNOSIS — E119 Type 2 diabetes mellitus without complications: Secondary | ICD-10-CM | POA: Insufficient documentation

## 2020-06-13 DIAGNOSIS — G893 Neoplasm related pain (acute) (chronic): Secondary | ICD-10-CM | POA: Insufficient documentation

## 2020-06-13 DIAGNOSIS — I10 Essential (primary) hypertension: Secondary | ICD-10-CM | POA: Insufficient documentation

## 2020-06-13 DIAGNOSIS — Z79899 Other long term (current) drug therapy: Secondary | ICD-10-CM | POA: Insufficient documentation

## 2020-06-13 DIAGNOSIS — Z923 Personal history of irradiation: Secondary | ICD-10-CM | POA: Insufficient documentation

## 2020-06-13 DIAGNOSIS — C3410 Malignant neoplasm of upper lobe, unspecified bronchus or lung: Secondary | ICD-10-CM | POA: Diagnosis present

## 2020-06-13 LAB — COMPREHENSIVE METABOLIC PANEL
ALT: 6 U/L (ref 0–44)
AST: 17 U/L (ref 15–41)
Albumin: 3.3 g/dL — ABNORMAL LOW (ref 3.5–5.0)
Alkaline Phosphatase: 76 U/L (ref 38–126)
Anion gap: 8 (ref 5–15)
BUN: 17 mg/dL (ref 8–23)
CO2: 25 mmol/L (ref 22–32)
Calcium: 9.7 mg/dL (ref 8.9–10.3)
Chloride: 104 mmol/L (ref 98–111)
Creatinine, Ser: 0.9 mg/dL (ref 0.44–1.00)
GFR, Estimated: >60 mL/min (ref >60)
Glucose, Bld: 134 mg/dL — ABNORMAL HIGH (ref 70–99)
Potassium: 4.5 mmol/L (ref 3.5–5.1)
Sodium: 137 mmol/L (ref 135–145)
Total Bilirubin: 0.7 mg/dL (ref 0.3–1.2)
Total Protein: 7.2 g/dL (ref 6.5–8.1)

## 2020-06-13 LAB — CBC WITH DIFFERENTIAL/PLATELET
Abs Immature Granulocytes: 0.04 10*3/uL (ref 0.00–0.07)
Basophils Absolute: 0 10*3/uL (ref 0.0–0.1)
Basophils Relative: 0 %
Eosinophils Absolute: 0 10*3/uL (ref 0.0–0.5)
Eosinophils Relative: 0 %
HCT: 33.6 % — ABNORMAL LOW (ref 36.0–46.0)
Hemoglobin: 10.6 g/dL — ABNORMAL LOW (ref 12.0–15.0)
Immature Granulocytes: 0 %
Lymphocytes Relative: 16 %
Lymphs Abs: 1.7 10*3/uL (ref 0.7–4.0)
MCH: 29.9 pg (ref 26.0–34.0)
MCHC: 31.5 g/dL (ref 30.0–36.0)
MCV: 94.6 fL (ref 80.0–100.0)
Monocytes Absolute: 1.1 10*3/uL — ABNORMAL HIGH (ref 0.1–1.0)
Monocytes Relative: 10 %
Neutro Abs: 7.9 10*3/uL — ABNORMAL HIGH (ref 1.7–7.7)
Neutrophils Relative %: 74 %
Platelets: 362 10*3/uL (ref 150–400)
RBC: 3.55 MIL/uL — ABNORMAL LOW (ref 3.87–5.11)
RDW: 14.6 % (ref 11.5–15.5)
WBC: 10.7 10*3/uL — ABNORMAL HIGH (ref 4.0–10.5)
nRBC: 0 % (ref 0.0–0.2)

## 2020-06-13 MED ORDER — SODIUM CHLORIDE 0.9% FLUSH
10.0000 mL | INTRAVENOUS | Status: DC | PRN
  Administered 2020-06-13: 10 mL
  Filled 2020-06-13: qty 10

## 2020-06-13 MED ORDER — SODIUM CHLORIDE 0.9 % IV SOLN
10.0000 mg | Freq: Once | INTRAVENOUS | Status: AC
  Administered 2020-06-13: 10 mg via INTRAVENOUS
  Filled 2020-06-13: qty 10

## 2020-06-13 MED ORDER — ONDANSETRON HCL 8 MG PO TABS
8.0000 mg | ORAL_TABLET | Freq: Once | ORAL | Status: AC
  Administered 2020-06-13: 8 mg via ORAL

## 2020-06-13 MED ORDER — SODIUM CHLORIDE 0.9% FLUSH
10.0000 mL | INTRAVENOUS | Status: DC | PRN
  Administered 2020-06-13: 10 mL via INTRAVENOUS
  Filled 2020-06-13: qty 10

## 2020-06-13 MED ORDER — SODIUM CHLORIDE 0.9 % IV SOLN
500.0000 mg/m2 | Freq: Once | INTRAVENOUS | Status: AC
  Administered 2020-06-13: 800 mg via INTRAVENOUS
  Filled 2020-06-13: qty 12

## 2020-06-13 MED ORDER — HEPARIN SOD (PORK) LOCK FLUSH 100 UNIT/ML IV SOLN
500.0000 [IU] | Freq: Once | INTRAVENOUS | Status: AC | PRN
  Administered 2020-06-13: 500 [IU]
  Filled 2020-06-13: qty 5

## 2020-06-13 MED ORDER — ONDANSETRON HCL 8 MG PO TABS
ORAL_TABLET | ORAL | Status: AC
  Filled 2020-06-13: qty 1

## 2020-06-13 MED ORDER — SODIUM CHLORIDE 0.9 % IV SOLN
Freq: Once | INTRAVENOUS | Status: AC
  Filled 2020-06-13: qty 250

## 2020-06-13 NOTE — Progress Notes (Signed)
[facility] [phone]   [phone]  OFFICE PROGRESS NOTE  [name], MD [place] [address]  DIAGNOSIS: Metastatic non-small cell lung cancer initially diagnosed as locally advanced stage IIIB with a right Pancoast tumor involving the vertebral body as well as prominent canal invasion with spinal cord compression in October 2007. The patient also has metastatic disease to the brain in April 2008.  PRIOR THERAPY: 1. Status post concurrent chemoradiation with weekly carboplatin and paclitaxel, last dose was given November 18, 2005. 2. 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 3. Status post gamma knife radiotherapy to a solitary brain lesion located in the superior frontal area of the brain at [facility] in April of 2008. 4. Status post palliative radiotherapy to the lateral abdominal wall metastatic lesion under the care of Dr. Lisbeth Renshaw, completed March of 2009. 5. Status post 6 cycles of systemic chemotherapy with carboplatin and Alimta. Last dose was given July 26, 2007 with disease stabilization. 6. Gamma knife stereotactic radiotherapy to 2 brain lesions one involving the right frontal dural based as well as right parietal lesion performed on 05/07/2012 under the care of Dr. Vallarie Mare at [facility].  CURRENT THERAPY: Maintenance systemic chemotherapy with Alimta 500 MG/M2 every 3 weeks, status post 221 cycles.  INTERVAL HISTORY: Danielle [name] 70 y.o. female returns to the clinic today for follow-up visit.  The patient is feeling fine today with no concerning complaints except for a recent right sided dental abscess and she is currently on amoxicillin and expected to have dental extraction in few days.  She had similar episode in the past and she did fine while she was on chemotherapy.  She denied having any current chest pain,  shortness of breath, cough or hemoptysis.  She denied having any fever or chills.  She has no nausea, vomiting, diarrhea or constipation.  She denied having any headache or visual changes.  She is here today for evaluation before starting cycle #222.  MEDICAL HISTORY: Past Medical History:  Diagnosis Date  . Anemia   . 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 Carcinomas, basal cell 01/22/2019   two places on face  . CVA (cerebral vascular accident) ([place]) 04/2014   pt states had 2 cva's within 2 wks  . Diabetes mellitus without complication ([place])   . DJD (degenerative joint disease), cervical   . Fibromyalgia   . H/O: pneumonia   . History of tobacco abuse quit 10/08   on nicotine patch  . Hypertension   . Hypokalemia   . Lung cancer ([place]) dx'd 09/2005   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 had chemo and radiation for lung ca  . Thrush 12/11/2010    ALLERGIES:  is allergic to contrast media [iodinated diagnostic agents], iohexol, sulfa drugs cross reactors, and co-trimoxazole injection [sulfamethoxazole-trimethoprim].  MEDICATIONS:  Current Outpatient Medications  Medication Sig Dispense Refill  . albuterol (VENTOLIN HFA) 108 (90 Base) MCG/ACT inhaler TAKE 2 PUFFS BY MOUTH EVERY 6 HOURS AS NEEDED FOR WHEEZE OR SHORTNESS OF BREATH 18 each 2  . alendronate (FOSAMAX) 70 MG tablet Take 1 tablet (70 mg total) by mouth every 7 (seven) days. Take with a full glass of water on an empty stomach. 4 tablet 11  . amLODipine (NORVASC) 10 MG tablet TAKE 1 TABLET BY MOUTH EVERY DAY 90 tablet 2  . aspirin 325 MG EC tablet  TAKE 1 TABLET BY MOUTH EVERY DAY 90 tablet 0  . atorvastatin (LIPITOR) 40 MG tablet TAKE 1 TABLET BY MOUTH EVERY DAY AT 6 PM 90 tablet 3  . Cholecalciferol (VITAMIN D3) 50 MCG (2000 UT) TABS Take by mouth.    . diphenhydrAMINE (BENADRYL) 25 MG tablet Take 1 tablet (25 mg total) by mouth as directed. 2 hours before CT scan. 5 tablet 0  . FeFum-FePoly-FA-B  Cmp-C-Biot (INTEGRA PLUS) CAPS TAKE 1 CAPSULE BY MOUTH EVERY DAY IN THE MORNING 90 capsule 3  . fluticasone (FLONASE) 50 MCG/ACT nasal spray Place 1-2 sprays into both nostrils daily as needed. 16 g 5  . folic acid (FOLVITE) 1 MG tablet TAKE 1 TABLET BY MOUTH EVERY DAY 90 tablet 1  . furosemide (LASIX) 20 MG tablet TAKE 1 TABLET BY MOUTH EVERY DAY AS NEEDED FOR SWELLING 20 tablet 1  . lidocaine-prilocaine (EMLA) cream Apply to the Port-A-Cath site 30-60-minute before treatment. 30 g 0  . lisinopril (ZESTRIL) 20 MG tablet Take 40 mg by mouth daily.    Marland Kitchen morphine (MS CONTIN) 60 MG 12 hr tablet Take 1 tablet (60 mg total) by mouth 2 (two) times daily. 60 tablet 0  . morphine (MSIR) 30 MG tablet Take 1 tablet (30 mg total) by mouth every 4 (four) hours as needed (breakthrough pain). 60 tablet 0  . Olopatadine HCl 0.2 % SOLN INSTILL 1 DROP INTO AFFECTED EYE(S) EVERY DAY 2.5 mL 1  . ondansetron (ZOFRAN) 8 MG tablet TAKE 1 TABLET BY MOUTH EVERY 8 HOURS AS NEEDED FOR NAUSEA OR VOMITING. 20 tablet 0  . predniSONE (DELTASONE) 50 MG tablet 1 tablet by mouth 13 hours ,7 hours and 1 hour  before CT scan 3 tablet 4  . prochlorperazine (COMPAZINE) 10 MG tablet Take 1 tablet (10 mg total) by mouth every 6 (six) hours as needed. 30 tablet 2  . triamcinolone (KENALOG) 0.025 % ointment APPLY TO AFFECTED AREA TWICE A DAY 30 g 0  . umeclidinium bromide (INCRUSE ELLIPTA) 62.5 MCG/INH AEPB Inhale 1 puff into the lungs daily. 1 each 11   No current facility-administered medications for this visit.    SURGICAL HISTORY:  Past Surgical History:  Procedure Laterality Date  . CERVICAL LAMINECTOMY  1995  . KNEE SURGERY  1990   Left x 2  . 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 LOOP RECORDER IMPLANT N/A 04/19/2014   Procedure: LOOP RECORDER IMPLANT;  Surgeon: Thompson Grayer, MD;  Location: [facility] CATH LAB;  Service: Cardiovascular;  Laterality: N/A;  . OTHER SURGICAL HISTORY  2008   Gamma knife  surgery to remove brain met   . PORTACATH PLACEMENT  -[name]   TIP IN CAVOATRIAL JUNCTION  . TEE WITHOUT CARDIOVERSION N/A 04/19/2014   Procedure: TRANSESOPHAGEAL ECHOCARDIOGRAM (TEE);  Surgeon: Larey Dresser, MD;  Location: [facility];  Service: Cardiovascular;  Laterality: N/A;    REVIEW OF SYSTEMS:  A comprehensive review of systems was negative except for: Constitutional: positive for fatigue   PHYSICAL EXAMINATION: General appearance: alert, cooperative, fatigued and no distress Head: Normocephalic, without obvious abnormality, atraumatic Neck: no adenopathy, no JVD, supple, symmetrical, trachea midline and thyroid not enlarged, symmetric, no tenderness/mass/nodules Lymph nodes: Cervical, supraclavicular, and axillary nodes normal. Resp: clear to auscultation bilaterally Back: symmetric, no curvature. ROM normal. No CVA tenderness. Cardio: regular rate and rhythm, S1, S2 normal, no murmur, click, rub or gallop GI: soft, non-tender; bowel sounds normal; no masses,  no organomegaly Extremities: extremities normal, atraumatic, no cyanosis or edema  ECOG PERFORMANCE STATUS: 1 - Symptomatic but completely ambulatory  Blood pressure (!) 131/59, pulse 95, temperature (!) 97.4 F (36.3 C), temperature source Tympanic, resp. rate 19, height 5' 2.75" (1.594 m), weight 147 lb 3.2 oz (66.8 kg), SpO2 94 %.  LABORATORY DATA: Lab Results  Component Value Date   WBC 10.7 (H) 06/13/2020   HGB 10.6 (L) 06/13/2020   HCT 33.6 (L) 06/13/2020   MCV 94.6 06/13/2020   PLT 362 06/13/2020      Chemistry      Component Value Date/Time   NA 142 05/23/2020 0927   NA 142 12/24/2016 0816   K 4.4 05/23/2020 0927   K 4.4 12/24/2016 0816   CL 106 05/23/2020 0927   CL 106 06/30/2012 1004   CO2 25 05/23/2020 0927   CO2 28 12/24/2016 0816   BUN 16 05/23/2020 0927   BUN 15.8 12/24/2016 0816   CREATININE 0.85 05/23/2020 0927   CREATININE 0.82 05/02/2020 1036   CREATININE 1.1 12/24/2016 0816       Component Value Date/Time   CALCIUM 10.1 05/23/2020 0927   CALCIUM 9.6 12/24/2016 0816   ALKPHOS 93 05/23/2020 0927   ALKPHOS 80 12/24/2016 0816   AST 23 05/23/2020 0927   AST 24 05/02/2020 1036   AST 20 12/24/2016 0816   ALT 10 05/23/2020 0927   ALT 10 05/02/2020 1036   ALT 11 12/24/2016 0816   BILITOT 0.4 05/23/2020 0927   BILITOT 0.4 05/02/2020 1036   BILITOT 0.34 12/24/2016 0816       RADIOGRAPHIC STUDIES: No results found.  ASSESSMENT AND PLAN: This is a very pleasant 70 years old white female with metastatic non-small cell lung cancer diagnosed in October 2007 status post concurrent chemoradiation followed by consolidation chemotherapy and she is currently on maintenance treatment with single agent Alimta status post 221 cycles. The patient has no complaints today except for the dental abscess and she is currently on amoxicillin and expected to have dental extraction in few days. I gave her the option of delaying her treatment until improvement of her dental issue but she declined and she would like to proceed with this treatment today as planned.  She had similar episodes in the past and she did fine. I will see her back for follow-up visit in 3 weeks for evaluation before starting cycle #223. For pain management the patient will continue her current treatment with MS Contin and morphine sulfate as needed. The patient was advised to call immediately if she has any other concerning symptoms in the interval. The patient voices understanding of current disease status and treatment options and is in agreement with the current care plan. All questions were answered. The patient knows to call the clinic with any problems, questions or concerns. We can certainly see the patient much sooner if necessary.  Disclaimer: This note was dictated with voice recognition software. Similar sounding words can inadvertently be transcribed and may not be corrected upon review.

## 2020-06-13 NOTE — Patient Instructions (Signed)
[facility]COL[facility]  Discharge Instructions: Thank you for choosing [facility] to provide your oncology and hematology care.   If you have a lab appointment with the [facility], please go directly to the [facility] and check in at the registration area.   Wear comfortable clothing and clothing appropriate for easy access to any Portacath or PICC line.   We strive to give you quality time with your provider. You may need to reschedule your appointment if you arrive late (15 or more minutes).  Arriving late affects you and other patients whose appointments are after yours.  Also, if you miss three or more appointments without notifying the office, you may be dismissed from the clinic at the provider's discretion.      For prescription refill requests, have your pharmacy contact our office and allow 72 hours for refills to be completed.    Today you received the following chemotherapy and/or immunotherapy agents Alimta      To help prevent nausea and vomiting after your treatment, we encourage you to take your nausea medication as directed.  BELOW ARE SYMPTOMS THAT SHOULD BE REPORTED IMMEDIATELY: . *FEVER GREATER THAN 100.4 F (38 C) OR HIGHER . *CHILLS OR SWEATING . *NAUSEA AND VOMITING THAT IS NOT CONTROLLED WITH YOUR NAUSEA MEDICATION . *UNUSUAL SHORTNESS OF BREATH . *UNUSUAL BRUISING OR BLEEDING . *URINARY PROBLEMS (pain or burning when urinating, or frequent urination) . *BOWEL PROBLEMS (unusual diarrhea, constipation, pain near the anus) . TENDERNESS IN MOUTH AND THROAT WITH OR WITHOUT PRESENCE OF ULCERS (sore throat, sores in mouth, or a toothache) . UNUSUAL RASH, SWELLING OR PAIN  . UNUSUAL VAGINAL DISCHARGE OR ITCHING   Items with * indicate a potential emergency and should be followed up as soon as possible or go to the Emergency Department if any problems should occur.  Please show the CHEMOTHERAPY ALERT CARD or IMMUNOTHERAPY ALERT CARD  at check-in to the Emergency Department and triage nurse.  Should you have questions after your visit or need to cancel or reschedule your appointment, please contact [facility]  Dept: [phone]  and follow the prompts.  Office hours are 8:00 a.m. to 4:30 p.m. Monday - Friday. Please note that voicemails left after 4:00 p.m. may not be returned until the following business day.  We are closed weekends and major holidays. You have access to a nurse at all times for urgent questions. Please call the main number to the clinic Dept: [phone] and follow the prompts.   For any non-urgent questions, you may also contact your provider using MyChart. We now offer e-Visits for anyone 75 and older to request care online for non-urgent symptoms. For details visit [email or web address].   Also download the MyChart app! Go to the app store, search "MyChart", open the app, select [facility], and log in with your MyChart username and password.  Due to Covid, a mask is required upon entering the hospital/clinic. If you do not have a mask, one will be given to you upon arrival. For doctor visits, patients may have 1 support person aged 76 or older with them. For treatment visits, patients cannot have anyone with them due to current Covid guidelines and our immunocompromised population.

## 2020-06-19 LAB — HM DIABETES EYE EXAM

## 2020-06-20 ENCOUNTER — Other Ambulatory Visit: Payer: Self-pay | Admitting: Internal Medicine

## 2020-06-20 ENCOUNTER — Other Ambulatory Visit: Payer: Self-pay

## 2020-06-20 DIAGNOSIS — C3410 Malignant neoplasm of upper lobe, unspecified bronchus or lung: Secondary | ICD-10-CM

## 2020-06-20 DIAGNOSIS — C3490 Malignant neoplasm of unspecified part of unspecified bronchus or lung: Secondary | ICD-10-CM

## 2020-06-21 ENCOUNTER — Other Ambulatory Visit: Payer: Self-pay | Admitting: Internal Medicine

## 2020-06-21 DIAGNOSIS — C3410 Malignant neoplasm of upper lobe, unspecified bronchus or lung: Secondary | ICD-10-CM

## 2020-06-21 DIAGNOSIS — C3490 Malignant neoplasm of unspecified part of unspecified bronchus or lung: Secondary | ICD-10-CM

## 2020-06-21 MED ORDER — MORPHINE SULFATE ER 60 MG PO TBCR
60.0000 mg | EXTENDED_RELEASE_TABLET | Freq: Two times a day (BID) | ORAL | 0 refills | Status: DC
Start: 2020-06-21 — End: 2020-07-25

## 2020-06-22 ENCOUNTER — Encounter: Payer: Self-pay | Admitting: Internal Medicine

## 2020-06-26 ENCOUNTER — Telehealth: Payer: Self-pay

## 2020-06-26 ENCOUNTER — Other Ambulatory Visit: Payer: Self-pay | Admitting: Physician Assistant

## 2020-06-26 DIAGNOSIS — C3410 Malignant neoplasm of upper lobe, unspecified bronchus or lung: Secondary | ICD-10-CM

## 2020-06-26 DIAGNOSIS — C3490 Malignant neoplasm of unspecified part of unspecified bronchus or lung: Secondary | ICD-10-CM

## 2020-06-26 MED ORDER — MORPHINE SULFATE 30 MG PO TABS
30.0000 mg | ORAL_TABLET | ORAL | 0 refills | Status: DC | PRN
Start: 2020-06-26 — End: 2020-10-18

## 2020-06-26 NOTE — Telephone Encounter (Signed)
Pt requests a refill of the following refills sent to her confirmed [facility] pharmacy on file:  Morphine 30mg  Morphine 60mg   Last filled 05/26/20 Next appt 07/04/20

## 2020-06-30 ENCOUNTER — Other Ambulatory Visit: Payer: Self-pay | Admitting: Family Medicine

## 2020-06-30 ENCOUNTER — Other Ambulatory Visit: Payer: Self-pay | Admitting: Physician Assistant

## 2020-06-30 ENCOUNTER — Other Ambulatory Visit: Payer: Self-pay | Admitting: Internal Medicine

## 2020-06-30 DIAGNOSIS — C3411 Malignant neoplasm of upper lobe, right bronchus or lung: Secondary | ICD-10-CM

## 2020-06-30 DIAGNOSIS — C7931 Secondary malignant neoplasm of brain: Secondary | ICD-10-CM

## 2020-07-01 ENCOUNTER — Encounter: Payer: Self-pay | Admitting: Internal Medicine

## 2020-07-03 ENCOUNTER — Other Ambulatory Visit: Payer: Self-pay | Admitting: Family Medicine

## 2020-07-04 ENCOUNTER — Inpatient Hospital Stay: Payer: Medicare (Managed Care)

## 2020-07-04 ENCOUNTER — Other Ambulatory Visit: Payer: Self-pay | Admitting: Family Medicine

## 2020-07-04 ENCOUNTER — Other Ambulatory Visit: Payer: Self-pay

## 2020-07-04 ENCOUNTER — Inpatient Hospital Stay (HOSPITAL_BASED_OUTPATIENT_CLINIC_OR_DEPARTMENT_OTHER): Payer: Medicare (Managed Care) | Admitting: Internal Medicine

## 2020-07-04 VITALS — BP 151/54 | HR 84 | Temp 97.8°F | Resp 18 | Ht 62.75 in | Wt 145.7 lb

## 2020-07-04 DIAGNOSIS — C7931 Secondary malignant neoplasm of brain: Secondary | ICD-10-CM

## 2020-07-04 DIAGNOSIS — C3411 Malignant neoplasm of upper lobe, right bronchus or lung: Secondary | ICD-10-CM

## 2020-07-04 DIAGNOSIS — C3490 Malignant neoplasm of unspecified part of unspecified bronchus or lung: Secondary | ICD-10-CM

## 2020-07-04 DIAGNOSIS — Z5111 Encounter for antineoplastic chemotherapy: Secondary | ICD-10-CM | POA: Diagnosis not present

## 2020-07-04 DIAGNOSIS — Z95828 Presence of other vascular implants and grafts: Secondary | ICD-10-CM

## 2020-07-04 LAB — CBC WITH DIFFERENTIAL/PLATELET
Abs Immature Granulocytes: 0.07 10*3/uL (ref 0.00–0.07)
Basophils Absolute: 0 10*3/uL (ref 0.0–0.1)
Basophils Relative: 0 %
Eosinophils Absolute: 0.1 10*3/uL (ref 0.0–0.5)
Eosinophils Relative: 1 %
HCT: 32.5 % — ABNORMAL LOW (ref 36.0–46.0)
Hemoglobin: 10.7 g/dL — ABNORMAL LOW (ref 12.0–15.0)
Immature Granulocytes: 1 %
Lymphocytes Relative: 24 %
Lymphs Abs: 3.1 10*3/uL (ref 0.7–4.0)
MCH: 30.7 pg (ref 26.0–34.0)
MCHC: 32.9 g/dL (ref 30.0–36.0)
MCV: 93.4 fL (ref 80.0–100.0)
Monocytes Absolute: 1.7 10*3/uL — ABNORMAL HIGH (ref 0.1–1.0)
Monocytes Relative: 13 %
Neutro Abs: 7.7 10*3/uL (ref 1.7–7.7)
Neutrophils Relative %: 61 %
Platelets: 430 10*3/uL — ABNORMAL HIGH (ref 150–400)
RBC: 3.48 MIL/uL — ABNORMAL LOW (ref 3.87–5.11)
RDW: 15.2 % (ref 11.5–15.5)
WBC: 12.6 10*3/uL — ABNORMAL HIGH (ref 4.0–10.5)
nRBC: 0 % (ref 0.0–0.2)

## 2020-07-04 LAB — COMPREHENSIVE METABOLIC PANEL
ALT: 9 U/L (ref 0–44)
AST: 17 U/L (ref 15–41)
Albumin: 3.1 g/dL — ABNORMAL LOW (ref 3.5–5.0)
Alkaline Phosphatase: 72 U/L (ref 38–126)
Anion gap: 10 (ref 5–15)
BUN: 31 mg/dL — ABNORMAL HIGH (ref 8–23)
CO2: 27 mmol/L (ref 22–32)
Calcium: 8.7 mg/dL — ABNORMAL LOW (ref 8.9–10.3)
Chloride: 100 mmol/L (ref 98–111)
Creatinine, Ser: 1.07 mg/dL — ABNORMAL HIGH (ref 0.44–1.00)
GFR, Estimated: 57 mL/min — ABNORMAL LOW (ref >60)
Glucose, Bld: 121 mg/dL — ABNORMAL HIGH (ref 70–99)
Potassium: 3.8 mmol/L (ref 3.5–5.1)
Sodium: 137 mmol/L (ref 135–145)
Total Bilirubin: 0.3 mg/dL (ref 0.3–1.2)
Total Protein: 6.6 g/dL (ref 6.5–8.1)

## 2020-07-04 MED ORDER — ONDANSETRON HCL 8 MG PO TABS
8.0000 mg | ORAL_TABLET | Freq: Once | ORAL | Status: AC
  Administered 2020-07-04: 8 mg via ORAL

## 2020-07-04 MED ORDER — CYANOCOBALAMIN 1000 MCG/ML IJ SOLN
INTRAMUSCULAR | Status: AC
  Filled 2020-07-04: qty 1

## 2020-07-04 MED ORDER — PEMETREXED DISODIUM CHEMO INJECTION 500 MG
500.0000 mg/m2 | Freq: Once | INTRAVENOUS | Status: AC
  Administered 2020-07-04: 800 mg via INTRAVENOUS
  Filled 2020-07-04: qty 20

## 2020-07-04 MED ORDER — SODIUM CHLORIDE 0.9 % IV SOLN
Freq: Once | INTRAVENOUS | Status: AC
Start: 2020-07-04 — End: 2020-07-04
  Filled 2020-07-04: qty 250

## 2020-07-04 MED ORDER — CYANOCOBALAMIN 1000 MCG/ML IJ SOLN
1000.0000 ug | Freq: Once | INTRAMUSCULAR | Status: AC
  Administered 2020-07-04: 1000 ug via INTRAMUSCULAR

## 2020-07-04 MED ORDER — SODIUM CHLORIDE 0.9% FLUSH
10.0000 mL | INTRAVENOUS | Status: DC | PRN
  Administered 2020-07-04: 10 mL
  Filled 2020-07-04: qty 10

## 2020-07-04 MED ORDER — SODIUM CHLORIDE 0.9% FLUSH
10.0000 mL | INTRAVENOUS | Status: DC | PRN
  Administered 2020-07-04: 10 mL via INTRAVENOUS
  Filled 2020-07-04: qty 10

## 2020-07-04 MED ORDER — ONDANSETRON HCL 8 MG PO TABS
ORAL_TABLET | ORAL | Status: AC
  Filled 2020-07-04: qty 1

## 2020-07-04 MED ORDER — HEPARIN SOD (PORK) LOCK FLUSH 100 UNIT/ML IV SOLN
500.0000 [IU] | Freq: Once | INTRAVENOUS | Status: AC | PRN
  Administered 2020-07-04: 500 [IU]
  Filled 2020-07-04: qty 5

## 2020-07-04 MED ORDER — DEXAMETHASONE SODIUM PHOSPHATE 100 MG/10ML IJ SOLN
10.0000 mg | Freq: Once | INTRAMUSCULAR | Status: AC
  Administered 2020-07-04: 10 mg via INTRAVENOUS
  Filled 2020-07-04: qty 10

## 2020-07-04 NOTE — Patient Instructions (Signed)
[facility] ONCOL[facility]  Discharge Instructions: Thank you for choosing [facility] to provide your oncology and hematology care.   If you have a lab appointment with the [facility], please go directly to the [facility] and check in at the registration area.   Wear comfortable clothing and clothing appropriate for easy access to any Portacath or PICC line.   We strive to give you quality time with your provider. You may need to reschedule your appointment if you arrive late (15 or more minutes).  Arriving late affects you and other patients whose appointments are after yours.  Also, if you miss three or more appointments without notifying the office, you may be dismissed from the clinic at the provider's discretion.      For prescription refill requests, have your pharmacy contact our office and allow 72 hours for refills to be completed.    Today you received the following chemotherapy and/or immunotherapy agents : Alimta     To help prevent nausea and vomiting after your treatment, we encourage you to take your nausea medication as directed.  BELOW ARE SYMPTOMS THAT SHOULD BE REPORTED IMMEDIATELY: *FEVER GREATER THAN 100.4 F (38 C) OR HIGHER *CHILLS OR SWEATING *NAUSEA AND VOMITING THAT IS NOT CONTROLLED WITH YOUR NAUSEA MEDICATION *UNUSUAL SHORTNESS OF BREATH *UNUSUAL BRUISING OR BLEEDING *URINARY PROBLEMS (pain or burning when urinating, or frequent urination) *BOWEL PROBLEMS (unusual diarrhea, constipation, pain near the anus) TENDERNESS IN MOUTH AND THROAT WITH OR WITHOUT PRESENCE OF ULCERS (sore throat, sores in mouth, or a toothache) UNUSUAL RASH, SWELLING OR PAIN  UNUSUAL VAGINAL DISCHARGE OR ITCHING   Items with * indicate a potential emergency and should be followed up as soon as possible or go to the Emergency Department if any problems should occur.  Please show the CHEMOTHERAPY ALERT CARD or IMMUNOTHERAPY ALERT CARD at check-in to the  Emergency Department and triage nurse.  Should you have questions after your visit or need to cancel or reschedule your appointment, please contact [facility]  Dept: [phone]  and follow the prompts.  Office hours are 8:00 a.m. to 4:30 p.m. Monday - Friday. Please note that voicemails left after 4:00 p.m. may not be returned until the following business day.  We are closed weekends and major holidays. You have access to a nurse at all times for urgent questions. Please call the main number to the clinic Dept: [phone] and follow the prompts.   For any non-urgent questions, you may also contact your provider using MyChart. We now offer e-Visits for anyone 36 and older to request care online for non-urgent symptoms. For details visit [email or web address].   Also download the MyChart app! Go to the app store, search "MyChart", open the app, select [facility], and log in with your MyChart username and password.  Due to Covid, a mask is required upon entering the hospital/clinic. If you do not have a mask, one will be given to you upon arrival. For doctor visits, patients may have 1 support person aged 78 or older with them. For treatment visits, patients cannot have anyone with them due to current Covid guidelines and our immunocompromised population.

## 2020-07-04 NOTE — Addendum Note (Signed)
Addended by: Ardeen Garland on: 07/04/2020 09:52 AM   Modules accepted: Orders

## 2020-07-04 NOTE — Progress Notes (Signed)
[place] Telephone:(336) [phone]   Fax:(336) [phone]  OFFICE PROGRESS NOTE  [name], MD [place] [address]  DIAGNOSIS: Metastatic non-small cell lung cancer initially diagnosed as locally advanced stage IIIB with a right Pancoast tumor involving the vertebral body as well as prominent canal invasion with spinal cord compression in October 2007. The patient also has metastatic disease to the brain in April 2008.  PRIOR THERAPY: Status post concurrent chemoradiation with weekly carboplatin and paclitaxel, last dose was given November 18, 2005. 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 Status post gamma knife radiotherapy to a solitary brain lesion located in the superior frontal area of the brain at [facility] in April of 2008. Status post palliative radiotherapy to the lateral abdominal wall metastatic lesion under the care of Dr. Lisbeth Renshaw, completed March of 2009. Status post 6 cycles of systemic chemotherapy with carboplatin and Alimta. Last dose was given July 26, 2007 with disease stabilization. Gamma knife stereotactic radiotherapy to 2 brain lesions one involving the right frontal dural based as well as right parietal lesion performed on 05/07/2012 under the care of Dr. Vallarie Mare at [facility].  CURRENT THERAPY: Maintenance systemic chemotherapy with Alimta 500 MG/M2 every 3 weeks, status post 222 cycles.  INTERVAL HISTORY: Danielle [name] 70 y.o. female returns to the clinic today for follow-up visit.  The patient is feeling fine today with no concerning complaints.  She has dental extraction few weeks ago.  She also had viral infection last week.  She denied having any current fever or chills.  She has no nausea, vomiting, diarrhea or constipation.  She has no chest pain, shortness of breath, cough or hemoptysis.  She is here today for evaluation  before her chemotherapy.  MEDICAL HISTORY: Past Medical History:  Diagnosis Date   Anemia    Antineoplastic chemotherapy induced anemia 01/23/2016   Carcinomas, basal cell 01/22/2019   two places on face   CVA (cerebral vascular accident) ([place]) 04/2014   pt states had 2 cva's within 2 wks   Diabetes mellitus without complication ([place])    DJD (degenerative joint disease), cervical    Fibromyalgia    H/O: pneumonia    History of tobacco abuse quit 10/08   on nicotine patch   Hypertension    Hypokalemia    Lung cancer ([place]) dx'd 09/2005   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 had chemo and radiation for lung ca   Thrush 12/11/2010    ALLERGIES:  is allergic to contrast media [iodinated diagnostic agents], iohexol, sulfa drugs cross reactors, and co-trimoxazole injection [sulfamethoxazole-trimethoprim].  MEDICATIONS:  Current Outpatient Medications  Medication Sig Dispense Refill   albuterol (VENTOLIN HFA) 108 (90 Base) MCG/ACT inhaler TAKE 2 PUFFS BY MOUTH EVERY 6 HOURS AS NEEDED FOR WHEEZE OR SHORTNESS OF BREATH 18 each 2   alendronate (FOSAMAX) 70 MG tablet Take 1 tablet (70 mg total) by mouth every 7 (seven) days. Take with a full glass of water on an empty stomach. 4 tablet 11   amLODipine (NORVASC) 10 MG tablet TAKE 1 TABLET BY MOUTH EVERY DAY 90 tablet 2   amoxicillin (AMOXIL) 500 MG capsule Take 500 mg by mouth 3 (three) times daily.     aspirin 325 MG EC tablet TAKE 1 TABLET BY MOUTH EVERY DAY 90 tablet 0   atorvastatin (LIPITOR) 40 MG tablet TAKE 1 TABLET BY MOUTH EVERY  DAY AT 6 PM 90 tablet 3   Cholecalciferol (VITAMIN D3) 50 MCG (2000 UT) TABS Take by mouth.     diphenhydrAMINE (BENADRYL) 25 MG tablet Take 1 tablet (25 mg total) by mouth as directed. 2 hours before CT scan. 5 tablet 0   FeFum-FePoly-FA-B Cmp-C-Biot (INTEGRA PLUS) CAPS TAKE 1 CAPSULE BY MOUTH EVERY DAY IN THE MORNING 90 capsule 3   fluticasone (FLONASE) 50 MCG/ACT nasal spray Place 1-2 sprays into both  nostrils daily as needed. 16 g 5   folic acid (FOLVITE) 1 MG tablet TAKE 1 TABLET BY MOUTH EVERY DAY 90 tablet 1   furosemide (LASIX) 20 MG tablet TAKE 1 TABLET BY MOUTH EVERY DAY AS NEEDED FOR SWELLING 20 tablet 1   lidocaine-prilocaine (EMLA) cream Apply to the Port-A-Cath site 30-60-minute before treatment. 30 g 0   lisinopril-hydrochlorothiazide (ZESTORETIC) 20-12.5 MG tablet TAKE 2 TABLETS BY MOUTH DAILY 180 tablet 1   morphine (MS CONTIN) 60 MG 12 hr tablet Take 1 tablet (60 mg total) by mouth 2 (two) times daily. 60 tablet 0   morphine (MSIR) 30 MG tablet Take 1 tablet (30 mg total) by mouth every 4 (four) hours as needed (breakthrough pain). 60 tablet 0   Olopatadine HCl 0.2 % SOLN INSTILL 1 DROP INTO AFFECTED EYE(S) EVERY DAY 2.5 mL 1   ondansetron (ZOFRAN) 8 MG tablet TAKE 1 TABLET BY MOUTH EVERY 8 HOURS AS NEEDED FOR NAUSEA AND VOMITING 20 tablet 0   predniSONE (DELTASONE) 50 MG tablet 1 tablet by mouth 13 hours ,7 hours and 1 hour  before CT scan 3 tablet 4   prochlorperazine (COMPAZINE) 10 MG tablet Take 1 tablet (10 mg total) by mouth every 6 (six) hours as needed. 30 tablet 2   triamcinolone (KENALOG) 0.025 % ointment APPLY TO AFFECTED AREA TWICE A DAY 30 g 0   umeclidinium bromide (INCRUSE ELLIPTA) 62.5 MCG/INH AEPB Inhale 1 puff into the lungs daily. 1 each 11   No current facility-administered medications for this visit.    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   LOOP RECORDER IMPLANT N/A 04/19/2014   Procedure: LOOP RECORDER IMPLANT;  Surgeon: Thompson Grayer, MD;  Location: [facility] CATH LAB;  Service: Cardiovascular;  Laterality: N/A;   OTHER SURGICAL HISTORY  2008   Gamma knife surgery to remove brain met    PORTACATH PLACEMENT  -[name]   TIP IN CAVOATRIAL JUNCTION   TEE WITHOUT CARDIOVERSION N/A 04/19/2014    Procedure: TRANSESOPHAGEAL ECHOCARDIOGRAM (TEE);  Surgeon: Larey Dresser, MD;  Location: [facility];  Service: Cardiovascular;  Laterality: N/A;    REVIEW OF SYSTEMS:  A comprehensive review of systems was negative except for: Constitutional: positive for fatigue   PHYSICAL EXAMINATION: General appearance: alert, cooperative, fatigued, and no distress Head: Normocephalic, without obvious abnormality, atraumatic Neck: no adenopathy, no JVD, supple, symmetrical, trachea midline, and thyroid not enlarged, symmetric, no tenderness/mass/nodules Lymph nodes: Cervical, supraclavicular, and axillary nodes normal. Resp: clear to auscultation bilaterally Back: symmetric, no curvature. ROM normal. No CVA tenderness. Cardio: regular rate and rhythm, S1, S2 normal, no murmur, click, rub or gallop GI: soft, non-tender; bowel sounds normal; no masses,  no organomegaly Extremities: extremities normal, atraumatic, no cyanosis or edema  ECOG PERFORMANCE STATUS: 1 - Symptomatic but completely ambulatory  Blood pressure (!) 151/54, pulse 84, temperature 97.8 F (36.6 C), temperature source Tympanic, resp. rate 18, height 5' 2.75" (1.594 m), weight 145 lb 11.2 oz (66.1 kg), SpO2 95 %.  LABORATORY DATA: Lab Results  Component Value Date   WBC 12.6 (H) 07/04/2020   HGB 10.7 (L) 07/04/2020   HCT 32.5 (L) 07/04/2020   MCV 93.4 07/04/2020   PLT 430 (H) 07/04/2020      Chemistry      Component Value Date/Time   NA 137 06/13/2020 0948   NA 142 12/24/2016 0816   K 4.5 06/13/2020 0948   K 4.4 12/24/2016 0816   CL 104 06/13/2020 0948   CL 106 06/30/2012 1004   CO2 25 06/13/2020 0948   CO2 28 12/24/2016 0816   BUN 17 06/13/2020 0948   BUN 15.8 12/24/2016 0816   CREATININE 0.90 06/13/2020 0948   CREATININE 0.82 05/02/2020 1036   CREATININE 1.1 12/24/2016 0816      Component Value Date/Time   CALCIUM 9.7 06/13/2020 0948   CALCIUM 9.6 12/24/2016 0816   ALKPHOS 76 06/13/2020 0948   ALKPHOS 80  12/24/2016 0816   AST 17 06/13/2020 0948   AST 24 05/02/2020 1036   AST 20 12/24/2016 0816   ALT 6 06/13/2020 0948   ALT 10 05/02/2020 1036   ALT 11 12/24/2016 0816   BILITOT 0.7 06/13/2020 0948   BILITOT 0.4 05/02/2020 1036   BILITOT 0.34 12/24/2016 0816       RADIOGRAPHIC STUDIES: No results found.  ASSESSMENT AND PLAN: This is a very pleasant 70 years old white female with metastatic non-small cell lung cancer diagnosed in October 2007 status post concurrent chemoradiation followed by consolidation chemotherapy and she is currently on maintenance treatment with single agent Alimta status post 222 cycles. She continues to tolerate her treatment well with no concerning adverse effects. I recommended for the patient to proceed with cycle #223 today as planned. She will come back for follow-up visit in 3 weeks for evaluation before the next cycle of her treatment. For pain management the patient will continue her current treatment with MS Contin and morphine sulfate as needed. The patient was advised to call immediately if she has any concerning symptoms in the interval. The patient voices understanding of current disease status and treatment options and is in agreement with the current care plan. All questions were answered. The patient knows to call the clinic with any problems, questions or concerns. We can certainly see the patient much sooner if necessary.  Disclaimer: This note was dictated with voice recognition software. Similar sounding words can inadvertently be transcribed and may not be corrected upon review.

## 2020-07-05 ENCOUNTER — Telehealth: Payer: Self-pay | Admitting: *Deleted

## 2020-07-05 MED ORDER — LISINOPRIL 20 MG PO TABS: 40.0000 mg | ORAL_TABLET | Freq: Every day | ORAL | 1 refills | Status: DC

## 2020-07-05 NOTE — Telephone Encounter (Signed)
Received a call from [facility] .  Refill was sent in on 07/03/2020 for Lisinopril-HCTZ but the patient stated that she was change to just plan Lisinopril because the combination was drying out her kidneys.  Lisinopril-HCTZ in on current medication list.  After futher reviewing her chart.  Oncology had her discontinue to Lisnipriol-HCTZ and Dr. Diona Browner advised her to restart her Lisinopril.   Corrected [facility] [place]. Medication list updated.

## 2020-07-25 ENCOUNTER — Inpatient Hospital Stay: Payer: Medicare (Managed Care)

## 2020-07-25 ENCOUNTER — Other Ambulatory Visit: Payer: Self-pay

## 2020-07-25 ENCOUNTER — Inpatient Hospital Stay: Payer: Medicare (Managed Care) | Attending: Physician Assistant | Admitting: Internal Medicine

## 2020-07-25 VITALS — BP 134/64 | HR 89 | Temp 97.5°F | Resp 19 | Ht 62.75 in | Wt 141.9 lb

## 2020-07-25 DIAGNOSIS — Z87891 Personal history of nicotine dependence: Secondary | ICD-10-CM | POA: Insufficient documentation

## 2020-07-25 DIAGNOSIS — C7931 Secondary malignant neoplasm of brain: Secondary | ICD-10-CM | POA: Diagnosis not present

## 2020-07-25 DIAGNOSIS — Z5112 Encounter for antineoplastic immunotherapy: Secondary | ICD-10-CM | POA: Insufficient documentation

## 2020-07-25 DIAGNOSIS — M797 Fibromyalgia: Secondary | ICD-10-CM | POA: Diagnosis not present

## 2020-07-25 DIAGNOSIS — Z79899 Other long term (current) drug therapy: Secondary | ICD-10-CM | POA: Diagnosis not present

## 2020-07-25 DIAGNOSIS — Z8673 Personal history of transient ischemic attack (TIA), and cerebral infarction without residual deficits: Secondary | ICD-10-CM | POA: Insufficient documentation

## 2020-07-25 DIAGNOSIS — Z7982 Long term (current) use of aspirin: Secondary | ICD-10-CM | POA: Diagnosis not present

## 2020-07-25 DIAGNOSIS — C3411 Malignant neoplasm of upper lobe, right bronchus or lung: Secondary | ICD-10-CM

## 2020-07-25 DIAGNOSIS — E119 Type 2 diabetes mellitus without complications: Secondary | ICD-10-CM | POA: Insufficient documentation

## 2020-07-25 DIAGNOSIS — Z5111 Encounter for antineoplastic chemotherapy: Secondary | ICD-10-CM

## 2020-07-25 DIAGNOSIS — C3490 Malignant neoplasm of unspecified part of unspecified bronchus or lung: Secondary | ICD-10-CM

## 2020-07-25 DIAGNOSIS — C3410 Malignant neoplasm of upper lobe, unspecified bronchus or lung: Secondary | ICD-10-CM | POA: Insufficient documentation

## 2020-07-25 DIAGNOSIS — Z95828 Presence of other vascular implants and grafts: Secondary | ICD-10-CM

## 2020-07-25 DIAGNOSIS — I10 Essential (primary) hypertension: Secondary | ICD-10-CM | POA: Insufficient documentation

## 2020-07-25 LAB — CBC WITH DIFFERENTIAL/PLATELET
Abs Immature Granulocytes: 0.11 10*3/uL — ABNORMAL HIGH (ref 0.00–0.07)
Basophils Absolute: 0.1 10*3/uL (ref 0.0–0.1)
Basophils Relative: 1 %
Eosinophils Absolute: 0.1 10*3/uL (ref 0.0–0.5)
Eosinophils Relative: 1 %
HCT: 34.3 % — ABNORMAL LOW (ref 36.0–46.0)
Hemoglobin: 11 g/dL — ABNORMAL LOW (ref 12.0–15.0)
Immature Granulocytes: 1 %
Lymphocytes Relative: 25 %
Lymphs Abs: 3.3 10*3/uL (ref 0.7–4.0)
MCH: 30.1 pg (ref 26.0–34.0)
MCHC: 32.1 g/dL (ref 30.0–36.0)
MCV: 94 fL (ref 80.0–100.0)
Monocytes Absolute: 1.7 10*3/uL — ABNORMAL HIGH (ref 0.1–1.0)
Monocytes Relative: 13 %
Neutro Abs: 7.6 10*3/uL (ref 1.7–7.7)
Neutrophils Relative %: 59 %
Platelets: 551 10*3/uL — ABNORMAL HIGH (ref 150–400)
RBC: 3.65 MIL/uL — ABNORMAL LOW (ref 3.87–5.11)
RDW: 15.3 % (ref 11.5–15.5)
WBC: 12.8 10*3/uL — ABNORMAL HIGH (ref 4.0–10.5)
nRBC: 0 % (ref 0.0–0.2)

## 2020-07-25 LAB — COMPREHENSIVE METABOLIC PANEL
ALT: 17 U/L (ref 0–44)
AST: 24 U/L (ref 15–41)
Albumin: 3.2 g/dL — ABNORMAL LOW (ref 3.5–5.0)
Alkaline Phosphatase: 72 U/L (ref 38–126)
Anion gap: 10 (ref 5–15)
BUN: 12 mg/dL (ref 8–23)
CO2: 27 mmol/L (ref 22–32)
Calcium: 9.6 mg/dL (ref 8.9–10.3)
Chloride: 100 mmol/L (ref 98–111)
Creatinine, Ser: 1.02 mg/dL — ABNORMAL HIGH (ref 0.44–1.00)
GFR, Estimated: 60 mL/min — ABNORMAL LOW (ref >60)
Glucose, Bld: 202 mg/dL — ABNORMAL HIGH (ref 70–99)
Potassium: 3.9 mmol/L (ref 3.5–5.1)
Sodium: 137 mmol/L (ref 135–145)
Total Bilirubin: 0.3 mg/dL (ref 0.3–1.2)
Total Protein: 6.9 g/dL (ref 6.5–8.1)

## 2020-07-25 MED ORDER — SODIUM CHLORIDE 0.9 % IV SOLN
500.0000 mg/m2 | Freq: Once | INTRAVENOUS | Status: AC
  Administered 2020-07-25: 800 mg via INTRAVENOUS
  Filled 2020-07-25: qty 20

## 2020-07-25 MED ORDER — SODIUM CHLORIDE 0.9% FLUSH
10.0000 mL | INTRAVENOUS | Status: DC | PRN
  Administered 2020-07-25: 10 mL
  Filled 2020-07-25: qty 10

## 2020-07-25 MED ORDER — MORPHINE SULFATE ER 60 MG PO TBCR: 60.0000 mg | EXTENDED_RELEASE_TABLET | Freq: Two times a day (BID) | ORAL | 0 refills | Status: DC

## 2020-07-25 MED ORDER — ONDANSETRON HCL 8 MG PO TABS
ORAL_TABLET | ORAL | Status: AC
  Filled 2020-07-25: qty 1

## 2020-07-25 MED ORDER — SODIUM CHLORIDE 0.9 % IV SOLN
Freq: Once | INTRAVENOUS | Status: AC
  Filled 2020-07-25: qty 250

## 2020-07-25 MED ORDER — HEPARIN SOD (PORK) LOCK FLUSH 100 UNIT/ML IV SOLN
500.0000 [IU] | Freq: Once | INTRAVENOUS | Status: AC | PRN
  Administered 2020-07-25: 500 [IU]
  Filled 2020-07-25: qty 5

## 2020-07-25 MED ORDER — SODIUM CHLORIDE 0.9% FLUSH
10.0000 mL | INTRAVENOUS | Status: DC | PRN
  Administered 2020-07-25: 10 mL via INTRAVENOUS
  Filled 2020-07-25: qty 10

## 2020-07-25 MED ORDER — ONDANSETRON HCL 8 MG PO TABS
8.0000 mg | ORAL_TABLET | Freq: Once | ORAL | Status: AC
  Administered 2020-07-25: 8 mg via ORAL

## 2020-07-25 MED ORDER — SODIUM CHLORIDE 0.9 % IV SOLN
10.0000 mg | Freq: Once | INTRAVENOUS | Status: AC
  Administered 2020-07-25: 10 mg via INTRAVENOUS
  Filled 2020-07-25: qty 10

## 2020-07-25 NOTE — Patient Instructions (Signed)

## 2020-07-25 NOTE — Progress Notes (Signed)
[place] Telephone:([phone]   Fax:(336) [phone]  OFFICE PROGRESS NOTE  Danielle Sanders, MD [place] [address]  DIAGNOSIS: Metastatic non-small cell lung cancer initially diagnosed as locally advanced stage IIIB with a right Pancoast tumor involving the vertebral body as well as prominent canal invasion with spinal cord compression in October 2007. The patient also has metastatic disease to the brain in April 2008.  PRIOR THERAPY: Status post concurrent chemoradiation with weekly carboplatin and paclitaxel, last dose was given November 18, 2005. 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 Status post gamma knife radiotherapy to a solitary brain lesion located in the superior frontal area of the brain at [facility] in April of 2008. Status post palliative radiotherapy to the lateral abdominal wall metastatic lesion under the care of Dr. Lisbeth Renshaw, completed March of 2009. Status post 6 cycles of systemic chemotherapy with carboplatin and Alimta. Last dose was given July 26, 2007 with disease stabilization. Gamma knife stereotactic radiotherapy to 2 brain lesions one involving the right frontal dural based as well as right parietal lesion performed on 05/07/2012 under the care of Dr. Vallarie Mare at [facility].  CURRENT THERAPY: Maintenance systemic chemotherapy with Alimta 500 MG/M2 every 3 weeks, status post 223 cycles.  INTERVAL HISTORY: Danielle [name] 70 y.o. female returns to the clinic today for follow-up visit accompanied by her daughter.  The patient is feeling fine today with no concerning complaints.  She enjoyed [place] time at the beach.  She lost few pounds recently.  She denied having any current chest pain, shortness of breath, cough or hemoptysis.  She denied having any fever or chills.  She has no nausea, vomiting, diarrhea or constipation.  She has  no headache or visual changes.  She is here today for evaluation before starting cycle #224.  MEDICAL HISTORY: Past Medical History:  Diagnosis Date   Anemia    Antineoplastic chemotherapy induced anemia 01/23/2016   Carcinomas, basal cell 01/22/2019   two places on face   CVA (cerebral vascular accident) ([place]) 04/2014   pt states had 2 cva's within 2 wks   Diabetes mellitus without complication ([place])    DJD (degenerative joint disease), cervical    Fibromyalgia    H/O: pneumonia    History of tobacco abuse quit 10/08   on nicotine patch   Hypertension    Hypokalemia    Lung cancer ([place]) dx'd 09/2005   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 had chemo and radiation for lung ca   Thrush 12/11/2010    ALLERGIES:  is allergic to contrast media [iodinated diagnostic agents], iohexol, sulfa drugs cross reactors, and co-trimoxazole injection [sulfamethoxazole-trimethoprim].  MEDICATIONS:  Current Outpatient Medications  Medication Sig Dispense Refill   albuterol (VENTOLIN HFA) 108 (90 Base) MCG/ACT inhaler TAKE 2 PUFFS BY MOUTH EVERY 6 HOURS AS NEEDED FOR WHEEZE OR SHORTNESS OF BREATH 18 each 2   alendronate (FOSAMAX) 70 MG tablet Take 1 tablet (70 mg total) by mouth every 7 (seven) days. Take with a full glass of water on an empty stomach. 4 tablet 11   amLODipine (NORVASC) 10 MG tablet TAKE 1 TABLET BY MOUTH EVERY DAY 90 tablet 2   aspirin 325 MG EC tablet TAKE 1 TABLET BY MOUTH EVERY DAY 90 tablet 0   atorvastatin (LIPITOR) 40 MG tablet TAKE 1 TABLET BY MOUTH EVERY DAY AT 6 PM 90 tablet  3   Cholecalciferol (VITAMIN D3) 50 MCG (2000 UT) TABS Take by mouth.     diphenhydrAMINE (BENADRYL) 25 MG tablet Take 1 tablet (25 mg total) by mouth as directed. 2 hours before CT scan. 5 tablet 0   FeFum-FePoly-FA-B Cmp-C-Biot (INTEGRA PLUS) CAPS TAKE 1 CAPSULE BY MOUTH EVERY DAY IN THE MORNING 90 capsule 3   fluticasone (FLONASE) 50 MCG/ACT nasal spray Place 1-2 sprays into both nostrils daily as  needed. 16 g 5   folic acid (FOLVITE) 1 MG tablet TAKE 1 TABLET BY MOUTH EVERY DAY 90 tablet 1   furosemide (LASIX) 20 MG tablet TAKE 1 TABLET BY MOUTH EVERY DAY AS NEEDED FOR SWELLING 20 tablet 1   lidocaine-prilocaine (EMLA) cream Apply to the Port-A-Cath site 30-60-minute before treatment. 30 g 0   lisinopril (ZESTRIL) 20 MG tablet Take 2 tablets (40 mg total) by mouth daily. 180 tablet 1   morphine (MS CONTIN) 60 MG 12 hr tablet Take 1 tablet (60 mg total) by mouth 2 (two) times daily. 60 tablet 0   morphine (MSIR) 30 MG tablet Take 1 tablet (30 mg total) by mouth every 4 (four) hours as needed (breakthrough pain). 60 tablet 0   Olopatadine HCl 0.2 % SOLN INSTILL 1 DROP INTO AFFECTED EYE(S) EVERY DAY 2.5 mL 1   ondansetron (ZOFRAN) 8 MG tablet TAKE 1 TABLET BY MOUTH EVERY 8 HOURS AS NEEDED FOR NAUSEA AND VOMITING 20 tablet 0   predniSONE (DELTASONE) 50 MG tablet 1 tablet by mouth 13 hours ,7 hours and 1 hour  before CT scan 3 tablet 4   prochlorperazine (COMPAZINE) 10 MG tablet Take 1 tablet (10 mg total) by mouth every 6 (six) hours as needed. 30 tablet 2   triamcinolone (KENALOG) 0.025 % ointment APPLY TO AFFECTED AREA TWICE A DAY 30 g 0   umeclidinium bromide (INCRUSE ELLIPTA) 62.5 MCG/INH AEPB Inhale 1 puff into the lungs daily. 1 each 11   No current facility-administered medications for this visit.    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   LOOP RECORDER IMPLANT N/A 04/19/2014   Procedure: LOOP RECORDER IMPLANT;  Surgeon: Thompson Grayer, MD;  Location: [facility] CATH LAB;  Service: Cardiovascular;  Laterality: N/A;   OTHER SURGICAL HISTORY  2008   Gamma knife surgery to remove brain met    PORTACATH PLACEMENT  -[name]   TIP IN CAVOATRIAL JUNCTION   TEE WITHOUT CARDIOVERSION N/A 04/19/2014   Procedure: TRANSESOPHAGEAL  ECHOCARDIOGRAM (TEE);  Surgeon: Larey Dresser, MD;  Location: [facility];  Service: Cardiovascular;  Laterality: N/A;    REVIEW OF SYSTEMS:  A comprehensive review of systems was negative except for: Constitutional: positive for fatigue Gastrointestinal: positive for nausea   PHYSICAL EXAMINATION: General appearance: alert, cooperative, fatigued, and no distress Head: Normocephalic, without obvious abnormality, atraumatic Neck: no adenopathy, no JVD, supple, symmetrical, trachea midline, and thyroid not enlarged, symmetric, no tenderness/mass/nodules Lymph nodes: Cervical, supraclavicular, and axillary nodes normal. Resp: clear to auscultation bilaterally Back: symmetric, no curvature. ROM normal. No CVA tenderness. Cardio: regular rate and rhythm, S1, S2 normal, no murmur, click, rub or gallop GI: soft, non-tender; bowel sounds normal; no masses,  no organomegaly Extremities: extremities normal, atraumatic, no cyanosis or edema  ECOG PERFORMANCE STATUS: 1 - Symptomatic but completely ambulatory  Blood pressure 134/64, pulse 89, temperature (!) 97.5 F (36.4 C), temperature source Tympanic, resp. rate 19, height 5' 2.75" (1.594 m), weight 141 lb 14.4 oz (64.4 kg), SpO2 94 %.  LABORATORY DATA: Lab Results  Component Value Date   WBC 12.8 (H) 07/25/2020   HGB 11.0 (L) 07/25/2020   HCT 34.3 (L) 07/25/2020   MCV 94.0 07/25/2020   PLT 551 (H) 07/25/2020      Chemistry      Component Value Date/Time   NA 137 07/25/2020 1021   NA 142 12/24/2016 0816   K 3.9 07/25/2020 1021   K 4.4 12/24/2016 0816   CL 100 07/25/2020 1021   CL 106 06/30/2012 1004   CO2 27 07/25/2020 1021   CO2 28 12/24/2016 0816   BUN 12 07/25/2020 1021   BUN 15.8 12/24/2016 0816   CREATININE 1.02 (H) 07/25/2020 1021   CREATININE 0.82 05/02/2020 1036   CREATININE 1.1 12/24/2016 0816      Component Value Date/Time   CALCIUM 9.6 07/25/2020 1021   CALCIUM 9.6 12/24/2016 0816   ALKPHOS 72 07/25/2020 1021    ALKPHOS 80 12/24/2016 0816   AST 24 07/25/2020 1021   AST 24 05/02/2020 1036   AST 20 12/24/2016 0816   ALT 17 07/25/2020 1021   ALT 10 05/02/2020 1036   ALT 11 12/24/2016 0816   BILITOT 0.3 07/25/2020 1021   BILITOT 0.4 05/02/2020 1036   BILITOT 0.34 12/24/2016 0816       RADIOGRAPHIC STUDIES: No results found.  ASSESSMENT AND PLAN: This is a very pleasant 70 years old white female with metastatic non-small cell lung cancer diagnosed in October 2007 status post concurrent chemoradiation followed by consolidation chemotherapy and she is currently on maintenance treatment with single agent Alimta status post 223 cycles. The patient continues to tolerate her treatment well with no concerning adverse effects. I recommended for her to proceed with cycle #224 today as planned. For pain management the patient will continue her current treatment with MS Contin and morphine sulfate as needed.  I gave her a refill of her MS Contin today I will see her back for follow-up visit in 3 weeks for evaluation before cycle #225. For the occasional nausea, she will continue her current treatment with Zofran. The patient was advised to call immediately if she has any concerning symptoms in the interval. The patient voices understanding of current disease status and treatment options and is in agreement with the current care plan. All questions were answered. The patient knows to call the clinic with any problems, questions or concerns. We can certainly see the patient much sooner if necessary.  Disclaimer: This note was dictated with voice recognition software. Similar sounding words can inadvertently be transcribed and may not be corrected upon review.

## 2020-07-25 NOTE — Patient Instructions (Signed)
[facility]COL[facility]   Discharge Instructions: Thank you for choosing [facility] to provide your oncology and hematology care.   If you have a lab appointment with the [facility], please go directly to the [facility] and check in at the registration area.   Wear comfortable clothing and clothing appropriate for easy access to any Portacath or PICC line.   We strive to give you quality time with your provider. You may need to reschedule your appointment if you arrive late (15 or more minutes).  Arriving late affects you and other patients whose appointments are after yours.  Also, if you miss three or more appointments without notifying the office, you may be dismissed from the clinic at the provider's discretion.      For prescription refill requests, have your pharmacy contact our office and allow 72 hours for refills to be completed.    Today you received the following chemotherapy and/or immunotherapy agents: pemetrexed.      To help prevent nausea and vomiting after your treatment, we encourage you to take your nausea medication as directed.  BELOW ARE SYMPTOMS THAT SHOULD BE REPORTED IMMEDIATELY: *FEVER GREATER THAN 100.4 F (38 C) OR HIGHER *CHILLS OR SWEATING *NAUSEA AND VOMITING THAT IS NOT CONTROLLED WITH YOUR NAUSEA MEDICATION *UNUSUAL SHORTNESS OF BREATH *UNUSUAL BRUISING OR BLEEDING *URINARY PROBLEMS (pain or burning when urinating, or frequent urination) *BOWEL PROBLEMS (unusual diarrhea, constipation, pain near the anus) TENDERNESS IN MOUTH AND THROAT WITH OR WITHOUT PRESENCE OF ULCERS (sore throat, sores in mouth, or a toothache) UNUSUAL RASH, SWELLING OR PAIN  UNUSUAL VAGINAL DISCHARGE OR ITCHING   Items with * indicate a potential emergency and should be followed up as soon as possible or go to the Emergency Department if any problems should occur.  Please show the CHEMOTHERAPY ALERT CARD or IMMUNOTHERAPY ALERT CARD at check-in  to the Emergency Department and triage nurse.  Should you have questions after your visit or need to cancel or reschedule your appointment, please contact [facility]  Dept: [phone]  and follow the prompts.  Office hours are 8:00 a.m. to 4:30 p.m. Monday - Friday. Please note that voicemails left after 4:00 p.m. may not be returned until the following business day.  We are closed weekends and major holidays. You have access to a nurse at all times for urgent questions. Please call the main number to the clinic Dept: [phone] and follow the prompts.   For any non-urgent questions, you may also contact your provider using MyChart. We now offer e-Visits for anyone 35 and older to request care online for non-urgent symptoms. For details visit [email or web address].   Also download the MyChart app! Go to the app store, search "MyChart", open the app, select [facility], and log in with your MyChart username and password.  Due to Covid, a mask is required upon entering the hospital/clinic. If you do not have a mask, one will be given to you upon arrival. For doctor visits, patients may have 1 support person aged 58 or older with them. For treatment visits, patients cannot have anyone with them due to current Covid guidelines and our immunocompromised population.

## 2020-07-26 MED ORDER — DIPHENHYDRAMINE HCL 25 MG PO CAPS
ORAL_CAPSULE | ORAL | Status: AC
  Filled 2020-07-26: qty 1

## 2020-07-28 ENCOUNTER — Other Ambulatory Visit: Payer: Self-pay | Admitting: Family Medicine

## 2020-07-28 ENCOUNTER — Other Ambulatory Visit: Payer: Self-pay | Admitting: Internal Medicine

## 2020-07-28 DIAGNOSIS — C7931 Secondary malignant neoplasm of brain: Secondary | ICD-10-CM

## 2020-07-28 DIAGNOSIS — C3411 Malignant neoplasm of upper lobe, right bronchus or lung: Secondary | ICD-10-CM

## 2020-07-29 ENCOUNTER — Other Ambulatory Visit: Payer: Self-pay | Admitting: Physician Assistant

## 2020-07-29 DIAGNOSIS — C3410 Malignant neoplasm of upper lobe, unspecified bronchus or lung: Secondary | ICD-10-CM

## 2020-08-11 NOTE — Progress Notes (Signed)
[facility] OFFICE PROGRESS NOTE  Danielle Sanders, MD [place] [id number]  DIAGNOSIS: Metastatic non-small cell lung cancer initially diagnosed as locally advanced stage IIIB with a right Pancoast tumor involving the vertebral body as well as prominent canal invasion with spinal cord compression in October 2007. The patient also has metastatic disease to the brain in April 2008.  PRIOR THERAPY: Status post concurrent chemoradiation with weekly carboplatin and paclitaxel, last dose was given November 18, 2005. 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 Status post gamma knife radiotherapy to a solitary brain lesion located in the superior frontal area of the brain at [facility] in April of 2008. Status post palliative radiotherapy to the lateral abdominal wall metastatic lesion under the care of Dr. Lisbeth Renshaw, completed March of 2009. Status post 6 cycles of systemic chemotherapy with carboplatin and Alimta. Last dose was given July 26, 2007 with disease stabilization. Gamma knife stereotactic radiotherapy to 2 brain lesions one involving the right frontal dural based as well as right parietal lesion performed on 05/07/2012 under the care of Dr. Vallarie Mare at [facility].    CURRENT THERAPY: Maintenance systemic chemotherapy with Alimta 500 MG/M2 every 3 weeks, status post 224 cycles.  INTERVAL HISTORY: Danielle [name] 70 y.o. female returns to the clinic today for a follow up visit. She is feeling well today without any concerning complaints/ She is tolerating her treatment with maintenance Alimta well without any adverse side effects except for occasional nausea/vomiting. She takes zofran for nausea and compazine if needed. She denies any fever or chills. She reportedly has baseline night sweats for several years, which are unchanged. She denies any hemoptysis, cough, chest  pain.  She reports her baseline shortness of breath with exertion.  She denies any diarrhea or constipation.  She denies any headaches or visual changes.  She is here today for evaluation before starting cycle #225.     MEDICAL HISTORY: Past Medical History:  Diagnosis Date   Anemia    Antineoplastic chemotherapy induced anemia 01/23/2016   Carcinomas, basal cell 01/22/2019   two places on face   CVA (cerebral vascular accident) ([place]) 04/2014   pt states had 2 cva's within 2 wks   Diabetes mellitus without complication ([place])    DJD (degenerative joint disease), cervical    Fibromyalgia    H/O: pneumonia    History of tobacco abuse quit 10/08   on nicotine patch   Hypertension    Hypokalemia    Lung cancer ([place]) dx'd 09/2005   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 had chemo and radiation for lung ca   Thrush 12/11/2010    ALLERGIES:  is allergic to contrast media [iodinated diagnostic agents], iohexol, sulfa drugs cross reactors, and co-trimoxazole injection [sulfamethoxazole-trimethoprim].  MEDICATIONS:  Current Outpatient Medications  Medication Sig Dispense Refill   albuterol (VENTOLIN HFA) 108 (90 Base) MCG/ACT inhaler TAKE 2 PUFFS BY MOUTH EVERY 6 HOURS AS NEEDED FOR WHEEZE OR SHORTNESS OF BREATH 18 each 2   alendronate (FOSAMAX) 70 MG tablet Take 1 tablet (70 mg total) by mouth every 7 (seven) days. Take with a full glass of water on an empty stomach. 4 tablet 11   amLODipine (NORVASC) 10 MG tablet TAKE 1 TABLET BY MOUTH EVERY DAY 90 tablet 0   aspirin 325 MG EC tablet TAKE 1 TABLET BY MOUTH EVERY DAY 90 tablet 0   atorvastatin (LIPITOR) 40  MG tablet TAKE 1 TABLET BY MOUTH EVERY DAY AT 6 PM 90 tablet 3   Cholecalciferol (VITAMIN D3) 50 MCG (2000 UT) TABS Take by mouth.     diphenhydrAMINE (BENADRYL) 25 MG tablet Take 1 tablet (25 mg total) by mouth as directed. 2 hours before CT scan. 5 tablet 0   FeFum-FePoly-FA-B Cmp-C-Biot (INTEGRA PLUS) CAPS TAKE 1 CAPSULE BY MOUTH  EVERY DAY IN THE MORNING 90 capsule 3   fluticasone (FLONASE) 50 MCG/ACT nasal spray Place 1-2 sprays into both nostrils daily as needed. 16 g 5   folic acid (FOLVITE) 1 MG tablet TAKE 1 TABLET BY MOUTH EVERY DAY 90 tablet 1   furosemide (LASIX) 20 MG tablet TAKE 1 TABLET BY MOUTH EVERY DAY AS NEEDED FOR SWELLING 20 tablet 1   INCRUSE ELLIPTA 62.5 MCG/INH AEPB TAKE 1 PUFF BY MOUTH EVERY DAY 30 each 2   lidocaine-prilocaine (EMLA) cream Apply to the Port-A-Cath site 30-60-minute before treatment. 30 g 0   lisinopril (ZESTRIL) 20 MG tablet Take 2 tablets (40 mg total) by mouth daily. 180 tablet 1   morphine (MS CONTIN) 60 MG 12 hr tablet Take 1 tablet (60 mg total) by mouth 2 (two) times daily. 60 tablet 0   morphine (MSIR) 30 MG tablet Take 1 tablet (30 mg total) by mouth every 4 (four) hours as needed (breakthrough pain). 60 tablet 0   Olopatadine HCl 0.2 % SOLN INSTILL 1 DROP INTO AFFECTED EYE(S) EVERY DAY 2.5 mL 1   ondansetron (ZOFRAN) 8 MG tablet TAKE 1 TABLET BY MOUTH EVERY 8 HOURS AS NEEDED FOR NAUSEA AND VOMITING 20 tablet 0   predniSONE (DELTASONE) 50 MG tablet 1 tablet by mouth 13 hours ,7 hours and 1 hour  before CT scan 3 tablet 4   prochlorperazine (COMPAZINE) 10 MG tablet Take 1 tablet (10 mg total) by mouth every 6 (six) hours as needed. 30 tablet 2   triamcinolone (KENALOG) 0.025 % ointment APPLY TO AFFECTED AREA TWICE A DAY 30 g 0   No current facility-administered medications for this visit.    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   LOOP RECORDER IMPLANT N/A 04/19/2014   Procedure: LOOP RECORDER IMPLANT;  Surgeon: Thompson Grayer, MD;  Location: [facility] CATH LAB;  Service: Cardiovascular;  Laterality: N/A;   OTHER SURGICAL HISTORY  2008   Gamma knife surgery to remove brain met    PORTACATH PLACEMENT  -[name]   TIP IN  CAVOATRIAL JUNCTION   TEE WITHOUT CARDIOVERSION N/A 04/19/2014   Procedure: TRANSESOPHAGEAL ECHOCARDIOGRAM (TEE);  Surgeon: Larey Dresser, MD;  Location: [facility];  Service: Cardiovascular;  Laterality: N/A;    REVIEW OF SYSTEMS:   Review of Systems  Constitutional: Negative for appetite change, chills, fatigue, fever and unexpected weight change. HENT: Negative for mouth sores, nosebleeds, sore throat and trouble swallowing.   Eyes: Negative for eye problems and icterus. Respiratory: Positive for baseline shortness of breath with exertion. Negative for cough, hemoptysis, and wheezing.   Cardiovascular: Positive for bilateral lower extremity swelling. Negative for chest pain. Gastrointestinal: Positive for occasional nausea and vomiting. Negative for abdominal pain, constipation, and diarrhea. Genitourinary:  Negative for bladder incontinence, difficulty urinating, dysuria, frequency and hematuria.   Musculoskeletal: Negative for back pain, gait problem, neck pain and neck  stiffness. Skin: Positive for several scars on her skin. Negative for itching and rash. Neurological: Negative for dizziness, extremity weakness, gait problem, headaches, light-headedness and seizures. Hematological: Negative for adenopathy. Does not bruise/bleed easily. Psychiatric/Behavioral: Negative for confusion, depression and sleep disturbance. The patient is not nervous/anxious.       PHYSICAL EXAMINATION:  Blood pressure (!) 131/50, pulse 88, temperature (!) 97.5 F (36.4 C), resp. rate 18, height 5' 2.75" (1.594 m), weight 147 lb 1.6 oz (66.7 kg), SpO2 100 %.  ECOG PERFORMANCE STATUS: 1  Physical Exam  Constitutional: Oriented to person, place, and time and well-developed, well-nourished, and in no distress.   HENT: Head: Normocephalic and atraumatic. Mouth/Throat: Oropharynx is clear and moist. No oropharyngeal exudate. Eyes: Conjunctivae are normal. Right eye exhibits no discharge. Left eye exhibits  no discharge. No scleral icterus. Neck: Normal range of motion. Neck supple. Cardiovascular: Normal rate, regular rhythm, normal heart sounds and intact distal pulses.   Pulmonary/Chest: Effort normal and breath sounds quiet in all lung fields. No respiratory distress. No wheezes. No rales. Abdominal: Soft. Bowel sounds are normal. Exhibits no distension and no mass. There is no tenderness.  Musculoskeletal: Positive for bilateral lower extremity edema. Normal range of motion.  Lymphadenopathy:    No cervical adenopathy.  Neurological: Alert and oriented to person, place, and time. Exhibits normal muscle tone. Gait normal. Coordination normal. Ambulates with a cane. Skin: Skin is warm and dry. No rash noted. Not diaphoretic. No erythema. No pallor.  Psychiatric: Mood, memory and judgment normal. Vitals reviewed.  LABORATORY DATA: Lab Results  Component Value Date   WBC 8.4 08/15/2020   HGB 10.8 (L) 08/15/2020   HCT 33.1 (L) 08/15/2020   MCV 97.4 08/15/2020   PLT 310 08/15/2020      Chemistry      Component Value Date/Time   NA 139 08/15/2020 1111   NA 142 12/24/2016 0816   K 4.2 08/15/2020 1111   K 4.4 12/24/2016 0816   CL 107 08/15/2020 1111   CL 106 06/30/2012 1004   CO2 25 08/15/2020 1111   CO2 28 12/24/2016 0816   BUN 12 08/15/2020 1111   BUN 15.8 12/24/2016 0816   CREATININE 0.82 08/15/2020 1111   CREATININE 0.82 05/02/2020 1036   CREATININE 1.1 12/24/2016 0816      Component Value Date/Time   CALCIUM 9.5 08/15/2020 1111   CALCIUM 9.6 12/24/2016 0816   ALKPHOS 71 08/15/2020 1111   ALKPHOS 80 12/24/2016 0816   AST 20 08/15/2020 1111   AST 24 05/02/2020 1036   AST 20 12/24/2016 0816   ALT 11 08/15/2020 1111   ALT 10 05/02/2020 1036   ALT 11 12/24/2016 0816   BILITOT 0.4 08/15/2020 1111   BILITOT 0.4 05/02/2020 1036   BILITOT 0.34 12/24/2016 0816       RADIOGRAPHIC STUDIES:  No results found.   ASSESSMENT/PLAN:  This is a very pleasant 70 year old  Caucasian female with metastatic non-small cell lung cancer who was initially diagnosed in October 2007.  She is status post current chemoradiation followed by consolidation chemotherapy.  She is currently undergoing weekly maintainance chemotherapy with single agent Alimta 500 mg/m.  She is status post 224 cycles. She is tolerating treatment well without any concerning complaints except mild nausea/vomiting.   Labs were reviewed. She will proceed cycle #2225 today as scheduled.   We will see her for a follow up visit in 3 weeks for evaluation and to review her scan before starting cycle #226.  The  patient was advised to call immediately if she has any concerning symptoms in the interval. The patient voices understanding of current disease status and treatment options and is in agreement with the current care plan. All questions were answered. The patient knows to call the clinic with any problems, questions or concerns. We can certainly see the patient much sooner if necessary   No orders of the defined types were placed in this encounter.     The total time spent in the appointment was 20-29 minutes   Keylani Perlstein L Ginna Schuur, PA-C 08/15/20

## 2020-08-15 ENCOUNTER — Other Ambulatory Visit: Payer: Self-pay

## 2020-08-15 ENCOUNTER — Inpatient Hospital Stay: Payer: Medicare (Managed Care)

## 2020-08-15 ENCOUNTER — Inpatient Hospital Stay: Payer: Medicare (Managed Care) | Attending: Physician Assistant | Admitting: Physician Assistant

## 2020-08-15 VITALS — BP 131/50 | HR 88 | Temp 97.5°F | Resp 18 | Ht 62.75 in | Wt 147.1 lb

## 2020-08-15 DIAGNOSIS — I10 Essential (primary) hypertension: Secondary | ICD-10-CM | POA: Insufficient documentation

## 2020-08-15 DIAGNOSIS — Z87891 Personal history of nicotine dependence: Secondary | ICD-10-CM | POA: Diagnosis not present

## 2020-08-15 DIAGNOSIS — Z79899 Other long term (current) drug therapy: Secondary | ICD-10-CM | POA: Diagnosis not present

## 2020-08-15 DIAGNOSIS — Z79891 Long term (current) use of opiate analgesic: Secondary | ICD-10-CM | POA: Insufficient documentation

## 2020-08-15 DIAGNOSIS — Z95828 Presence of other vascular implants and grafts: Secondary | ICD-10-CM

## 2020-08-15 DIAGNOSIS — C3411 Malignant neoplasm of upper lobe, right bronchus or lung: Secondary | ICD-10-CM

## 2020-08-15 DIAGNOSIS — E119 Type 2 diabetes mellitus without complications: Secondary | ICD-10-CM | POA: Insufficient documentation

## 2020-08-15 DIAGNOSIS — C3410 Malignant neoplasm of upper lobe, unspecified bronchus or lung: Secondary | ICD-10-CM | POA: Insufficient documentation

## 2020-08-15 DIAGNOSIS — R112 Nausea with vomiting, unspecified: Secondary | ICD-10-CM | POA: Diagnosis not present

## 2020-08-15 DIAGNOSIS — Z5111 Encounter for antineoplastic chemotherapy: Secondary | ICD-10-CM | POA: Diagnosis not present

## 2020-08-15 DIAGNOSIS — M47812 Spondylosis without myelopathy or radiculopathy, cervical region: Secondary | ICD-10-CM | POA: Diagnosis not present

## 2020-08-15 DIAGNOSIS — Z8673 Personal history of transient ischemic attack (TIA), and cerebral infarction without residual deficits: Secondary | ICD-10-CM | POA: Insufficient documentation

## 2020-08-15 DIAGNOSIS — Z5112 Encounter for antineoplastic immunotherapy: Secondary | ICD-10-CM | POA: Diagnosis present

## 2020-08-15 DIAGNOSIS — C3490 Malignant neoplasm of unspecified part of unspecified bronchus or lung: Secondary | ICD-10-CM

## 2020-08-15 LAB — COMPREHENSIVE METABOLIC PANEL
ALT: 11 U/L (ref 0–44)
AST: 20 U/L (ref 15–41)
Albumin: 3.1 g/dL — ABNORMAL LOW (ref 3.5–5.0)
Alkaline Phosphatase: 71 U/L (ref 38–126)
Anion gap: 7 (ref 5–15)
BUN: 12 mg/dL (ref 8–23)
CO2: 25 mmol/L (ref 22–32)
Calcium: 9.5 mg/dL (ref 8.9–10.3)
Chloride: 107 mmol/L (ref 98–111)
Creatinine, Ser: 0.82 mg/dL (ref 0.44–1.00)
GFR, Estimated: >60 mL/min (ref >60)
Glucose, Bld: 159 mg/dL — ABNORMAL HIGH (ref 70–99)
Potassium: 4.2 mmol/L (ref 3.5–5.1)
Sodium: 139 mmol/L (ref 135–145)
Total Bilirubin: 0.4 mg/dL (ref 0.3–1.2)
Total Protein: 6.6 g/dL (ref 6.5–8.1)

## 2020-08-15 LAB — CBC WITH DIFFERENTIAL/PLATELET
Abs Immature Granulocytes: 0.03 10*3/uL (ref 0.00–0.07)
Basophils Absolute: 0 10*3/uL (ref 0.0–0.1)
Basophils Relative: 0 %
Eosinophils Absolute: 0 10*3/uL (ref 0.0–0.5)
Eosinophils Relative: 1 %
HCT: 33.1 % — ABNORMAL LOW (ref 36.0–46.0)
Hemoglobin: 10.8 g/dL — ABNORMAL LOW (ref 12.0–15.0)
Immature Granulocytes: 0 %
Lymphocytes Relative: 23 %
Lymphs Abs: 2 10*3/uL (ref 0.7–4.0)
MCH: 31.8 pg (ref 26.0–34.0)
MCHC: 32.6 g/dL (ref 30.0–36.0)
MCV: 97.4 fL (ref 80.0–100.0)
Monocytes Absolute: 0.9 10*3/uL (ref 0.1–1.0)
Monocytes Relative: 11 %
Neutro Abs: 5.4 10*3/uL (ref 1.7–7.7)
Neutrophils Relative %: 65 %
Platelets: 310 10*3/uL (ref 150–400)
RBC: 3.4 MIL/uL — ABNORMAL LOW (ref 3.87–5.11)
RDW: 16.5 % — ABNORMAL HIGH (ref 11.5–15.5)
WBC: 8.4 10*3/uL (ref 4.0–10.5)
nRBC: 0 % (ref 0.0–0.2)

## 2020-08-15 MED ORDER — ONDANSETRON HCL 8 MG PO TABS
8.0000 mg | ORAL_TABLET | Freq: Once | ORAL | Status: AC
  Administered 2020-08-15: 8 mg via ORAL

## 2020-08-15 MED ORDER — SODIUM CHLORIDE 0.9% FLUSH
10.0000 mL | INTRAVENOUS | Status: DC | PRN
  Administered 2020-08-15: 10 mL
  Filled 2020-08-15: qty 10

## 2020-08-15 MED ORDER — SODIUM CHLORIDE 0.9 % IV SOLN
500.0000 mg/m2 | Freq: Once | INTRAVENOUS | Status: AC
  Administered 2020-08-15: 800 mg via INTRAVENOUS
  Filled 2020-08-15: qty 20

## 2020-08-15 MED ORDER — SODIUM CHLORIDE 0.9% FLUSH
10.0000 mL | INTRAVENOUS | Status: DC | PRN
  Administered 2020-08-15: 10 mL via INTRAVENOUS
  Filled 2020-08-15: qty 10

## 2020-08-15 MED ORDER — ONDANSETRON HCL 8 MG PO TABS
ORAL_TABLET | ORAL | Status: AC
  Filled 2020-08-15: qty 1

## 2020-08-15 MED ORDER — SODIUM CHLORIDE 0.9 % IV SOLN
Freq: Once | INTRAVENOUS | Status: AC
  Filled 2020-08-15: qty 250

## 2020-08-15 MED ORDER — HEPARIN SOD (PORK) LOCK FLUSH 100 UNIT/ML IV SOLN
500.0000 [IU] | Freq: Once | INTRAVENOUS | Status: AC | PRN
Start: 2020-08-15 — End: 2020-08-15
  Administered 2020-08-15: 500 [IU]
  Filled 2020-08-15: qty 5

## 2020-08-15 MED ORDER — SODIUM CHLORIDE 0.9 % IV SOLN
10.0000 mg | Freq: Once | INTRAVENOUS | Status: AC
  Administered 2020-08-15: 10 mg via INTRAVENOUS
  Filled 2020-08-15: qty 1

## 2020-08-15 NOTE — Progress Notes (Signed)
No critical labs need to be addressed urgently. We will discuss labs in detail at upcoming office visit.   

## 2020-08-15 NOTE — Patient Instructions (Signed)
[facility] ONCOLOG[facility]  Discharge Instructions: Thank you for choosing [facility] to provide your oncology and hematology care.   If you have a lab appointment with the [facility], please go directly to the [facility] and check in at the registration area.   Wear comfortable clothing and clothing appropriate for easy access to any Portacath or PICC line.   We strive to give you quality time with your provider. You may need to reschedule your appointment if you arrive late (15 or more minutes).  Arriving late affects you and other patients whose appointments are after yours.  Also, if you miss three or more appointments without notifying the office, you may be dismissed from the clinic at the provider's discretion.      For prescription refill requests, have your pharmacy contact our office and allow 72 hours for refills to be completed.    Today you received the following chemotherapy and/or immunotherapy agents Alimta      To help prevent nausea and vomiting after your treatment, we encourage you to take your nausea medication as directed.  BELOW ARE SYMPTOMS THAT SHOULD BE REPORTED IMMEDIATELY: *FEVER GREATER THAN 100.4 F (38 C) OR HIGHER *CHILLS OR SWEATING *NAUSEA AND VOMITING THAT IS NOT CONTROLLED WITH YOUR NAUSEA MEDICATION *UNUSUAL SHORTNESS OF BREATH *UNUSUAL BRUISING OR BLEEDING *URINARY PROBLEMS (pain or burning when urinating, or frequent urination) *BOWEL PROBLEMS (unusual diarrhea, constipation, pain near the anus) TENDERNESS IN MOUTH AND THROAT WITH OR WITHOUT PRESENCE OF ULCERS (sore throat, sores in mouth, or a toothache) UNUSUAL RASH, SWELLING OR PAIN  UNUSUAL VAGINAL DISCHARGE OR ITCHING   Items with * indicate a potential emergency and should be followed up as soon as possible or go to the Emergency Department if any problems should occur.  Please show the CHEMOTHERAPY ALERT CARD or IMMUNOTHERAPY ALERT CARD at check-in to the  Emergency Department and triage nurse.  Should you have questions after your visit or need to cancel or reschedule your appointment, please contact [facility]  Dept: [phone]  and follow the prompts.  Office hours are 8:00 a.m. to 4:30 p.m. Monday - Friday. Please note that voicemails left after 4:00 p.m. may not be returned until the following business day.  We are closed weekends and major holidays. You have access to a nurse at all times for urgent questions. Please call the main number to the clinic Dept: [phone] and follow the prompts.   For any non-urgent questions, you may also contact your provider using MyChart. We now offer e-Visits for anyone 81 and older to request care online for non-urgent symptoms. For details visit [email or web address].   Also download the MyChart app! Go to the app store, search "MyChart", open the app, select [facility], and log in with your MyChart username and password.  Due to Covid, a mask is required upon entering the hospital/clinic. If you do not have a mask, one will be given to you upon arrival. For doctor visits, patients may have 1 support person aged 32 or older with them. For treatment visits, patients cannot have anyone with them due to current Covid guidelines and our immunocompromised population.

## 2020-08-28 ENCOUNTER — Other Ambulatory Visit: Payer: Self-pay | Admitting: Internal Medicine

## 2020-08-28 ENCOUNTER — Telehealth: Payer: Self-pay | Admitting: Medical Oncology

## 2020-08-28 DIAGNOSIS — C3410 Malignant neoplasm of upper lobe, unspecified bronchus or lung: Secondary | ICD-10-CM

## 2020-08-28 DIAGNOSIS — C3490 Malignant neoplasm of unspecified part of unspecified bronchus or lung: Secondary | ICD-10-CM

## 2020-08-28 MED ORDER — MORPHINE SULFATE ER 60 MG PO TBCR: 60.0000 mg | EXTENDED_RELEASE_TABLET | Freq: Two times a day (BID) | ORAL | 0 refills | Status: DC

## 2020-08-28 NOTE — Telephone Encounter (Signed)
Refill requested for MS Contin 60 mg tablets.

## 2020-08-30 NOTE — Telephone Encounter (Signed)
Pt called stating she has not been contacted by her pharmacy indicating her MS Contin 60mg  was  ready for pick-up[.  I reviewed pts chart and found that Dr. Julien Nordmann sent this rx on 08/28/20, the same day her refill was requested. Pt expressed understanding of this and will follow-up with [facility].

## 2020-09-04 ENCOUNTER — Other Ambulatory Visit (INDEPENDENT_AMBULATORY_CARE_PROVIDER_SITE_OTHER): Payer: Medicare (Managed Care)

## 2020-09-04 ENCOUNTER — Telehealth: Payer: Self-pay | Admitting: Family Medicine

## 2020-09-04 ENCOUNTER — Other Ambulatory Visit: Payer: Self-pay

## 2020-09-04 DIAGNOSIS — E119 Type 2 diabetes mellitus without complications: Secondary | ICD-10-CM

## 2020-09-04 LAB — LIPID PANEL
Cholesterol: 114 mg/dL (ref 0–200)
HDL: 38 mg/dL — ABNORMAL LOW (ref >39.00)
LDL Cholesterol: 54 mg/dL (ref 0–99)
NonHDL: 76.12
Total CHOL/HDL Ratio: 3
Triglycerides: 112 mg/dL (ref 0.0–149.0)
VLDL: 22.4 mg/dL (ref 0.0–40.0)

## 2020-09-04 LAB — HEMOGLOBIN A1C: Hgb A1c MFr Bld: 5.7 % (ref 4.6–6.5)

## 2020-09-04 NOTE — Progress Notes (Signed)
No critical labs need to be addressed urgently. We will discuss labs in detail at upcoming office visit.   

## 2020-09-04 NOTE — Telephone Encounter (Signed)
-----   Message from Ellamae Sia sent at 09/04/2020  9:07 AM EDT ----- Regarding: Lab orders for a walk in lab appt, today Patient is scheduled for CPX labs, please order future labs, Thanks , Karna Christmas

## 2020-09-05 ENCOUNTER — Ambulatory Visit: Payer: Medicare (Managed Care)

## 2020-09-05 ENCOUNTER — Other Ambulatory Visit: Payer: Medicare (Managed Care)

## 2020-09-06 ENCOUNTER — Other Ambulatory Visit: Payer: Self-pay

## 2020-09-06 ENCOUNTER — Inpatient Hospital Stay: Payer: Medicare (Managed Care)

## 2020-09-06 ENCOUNTER — Inpatient Hospital Stay (HOSPITAL_BASED_OUTPATIENT_CLINIC_OR_DEPARTMENT_OTHER): Payer: Medicare (Managed Care) | Admitting: Internal Medicine

## 2020-09-06 VITALS — BP 148/71 | HR 93 | Temp 97.7°F | Resp 20 | Ht 62.75 in | Wt 145.8 lb

## 2020-09-06 DIAGNOSIS — C7931 Secondary malignant neoplasm of brain: Secondary | ICD-10-CM | POA: Diagnosis not present

## 2020-09-06 DIAGNOSIS — D6481 Anemia due to antineoplastic chemotherapy: Secondary | ICD-10-CM | POA: Diagnosis not present

## 2020-09-06 DIAGNOSIS — Z5111 Encounter for antineoplastic chemotherapy: Secondary | ICD-10-CM

## 2020-09-06 DIAGNOSIS — C3490 Malignant neoplasm of unspecified part of unspecified bronchus or lung: Secondary | ICD-10-CM

## 2020-09-06 DIAGNOSIS — C3411 Malignant neoplasm of upper lobe, right bronchus or lung: Secondary | ICD-10-CM

## 2020-09-06 DIAGNOSIS — T451X5A Adverse effect of antineoplastic and immunosuppressive drugs, initial encounter: Secondary | ICD-10-CM | POA: Diagnosis not present

## 2020-09-06 DIAGNOSIS — Z95828 Presence of other vascular implants and grafts: Secondary | ICD-10-CM

## 2020-09-06 LAB — CBC WITH DIFFERENTIAL/PLATELET
Abs Immature Granulocytes: 0.06 10*3/uL (ref 0.00–0.07)
Basophils Absolute: 0 10*3/uL (ref 0.0–0.1)
Basophils Relative: 0 %
Eosinophils Absolute: 0.1 10*3/uL (ref 0.0–0.5)
Eosinophils Relative: 1 %
HCT: 35 % — ABNORMAL LOW (ref 36.0–46.0)
Hemoglobin: 11.3 g/dL — ABNORMAL LOW (ref 12.0–15.0)
Immature Granulocytes: 1 %
Lymphocytes Relative: 23 %
Lymphs Abs: 2.4 10*3/uL (ref 0.7–4.0)
MCH: 31.4 pg (ref 26.0–34.0)
MCHC: 32.3 g/dL (ref 30.0–36.0)
MCV: 97.2 fL (ref 80.0–100.0)
Monocytes Absolute: 1.1 10*3/uL — ABNORMAL HIGH (ref 0.1–1.0)
Monocytes Relative: 11 %
Neutro Abs: 6.7 10*3/uL (ref 1.7–7.7)
Neutrophils Relative %: 64 %
Platelets: 421 10*3/uL — ABNORMAL HIGH (ref 150–400)
RBC: 3.6 MIL/uL — ABNORMAL LOW (ref 3.87–5.11)
RDW: 15.1 % (ref 11.5–15.5)
WBC: 10.4 10*3/uL (ref 4.0–10.5)
nRBC: 0 % (ref 0.0–0.2)

## 2020-09-06 LAB — COMPREHENSIVE METABOLIC PANEL
ALT: 13 U/L (ref 0–44)
AST: 25 U/L (ref 15–41)
Albumin: 3.4 g/dL — ABNORMAL LOW (ref 3.5–5.0)
Alkaline Phosphatase: 71 U/L (ref 38–126)
Anion gap: 11 (ref 5–15)
BUN: 13 mg/dL (ref 8–23)
CO2: 23 mmol/L (ref 22–32)
Calcium: 9.9 mg/dL (ref 8.9–10.3)
Chloride: 108 mmol/L (ref 98–111)
Creatinine, Ser: 0.8 mg/dL (ref 0.44–1.00)
GFR, Estimated: >60 mL/min (ref >60)
Glucose, Bld: 156 mg/dL — ABNORMAL HIGH (ref 70–99)
Potassium: 4.2 mmol/L (ref 3.5–5.1)
Sodium: 142 mmol/L (ref 135–145)
Total Bilirubin: 0.5 mg/dL (ref 0.3–1.2)
Total Protein: 7.2 g/dL (ref 6.5–8.1)

## 2020-09-06 MED ORDER — SODIUM CHLORIDE 0.9% FLUSH
10.0000 mL | INTRAVENOUS | Status: DC | PRN
  Administered 2020-09-06: 10 mL

## 2020-09-06 MED ORDER — SODIUM CHLORIDE 0.9 % IV SOLN
500.0000 mg/m2 | Freq: Once | INTRAVENOUS | Status: AC
  Administered 2020-09-06: 800 mg via INTRAVENOUS
  Filled 2020-09-06: qty 20

## 2020-09-06 MED ORDER — SODIUM CHLORIDE 0.9 % IV SOLN
10.0000 mg | Freq: Once | INTRAVENOUS | Status: AC
  Administered 2020-09-06: 10 mg via INTRAVENOUS
  Filled 2020-09-06: qty 10

## 2020-09-06 MED ORDER — SODIUM CHLORIDE 0.9% FLUSH
10.0000 mL | INTRAVENOUS | Status: DC | PRN
  Administered 2020-09-06: 10 mL via INTRAVENOUS

## 2020-09-06 MED ORDER — ONDANSETRON HCL 8 MG PO TABS
8.0000 mg | ORAL_TABLET | Freq: Once | ORAL | Status: AC
  Administered 2020-09-06: 8 mg via ORAL
  Filled 2020-09-06: qty 1

## 2020-09-06 MED ORDER — SODIUM CHLORIDE 0.9 % IV SOLN: Freq: Once | INTRAVENOUS | Status: AC

## 2020-09-06 MED ORDER — CYANOCOBALAMIN 1000 MCG/ML IJ SOLN
1000.0000 ug | Freq: Once | INTRAMUSCULAR | Status: AC
  Administered 2020-09-06: 1000 ug via INTRAMUSCULAR
  Filled 2020-09-06: qty 1

## 2020-09-06 MED ORDER — HEPARIN SOD (PORK) LOCK FLUSH 100 UNIT/ML IV SOLN
500.0000 [IU] | Freq: Once | INTRAVENOUS | Status: AC | PRN
  Administered 2020-09-06: 500 [IU]

## 2020-09-06 NOTE — Patient Instructions (Signed)
[facility]COLOG[facility]   Discharge Instructions: Thank you for choosing [facility] to provide your oncology and hematology care.   If you have a lab appointment with the [facility], please go directly to the [facility] and check in at the registration area.   Wear comfortable clothing and clothing appropriate for easy access to any Portacath or PICC line.   We strive to give you quality time with your provider. You may need to reschedule your appointment if you arrive late (15 or more minutes).  Arriving late affects you and other patients whose appointments are after yours.  Also, if you miss three or more appointments without notifying the office, you may be dismissed from the clinic at the provider's discretion.      For prescription refill requests, have your pharmacy contact our office and allow 72 hours for refills to be completed.    Today you received the following chemotherapy and/or immunotherapy agents: pemetrexed.      To help prevent nausea and vomiting after your treatment, we encourage you to take your nausea medication as directed.  BELOW ARE SYMPTOMS THAT SHOULD BE REPORTED IMMEDIATELY: *FEVER GREATER THAN 100.4 F (38 C) OR HIGHER *CHILLS OR SWEATING *NAUSEA AND VOMITING THAT IS NOT CONTROLLED WITH YOUR NAUSEA MEDICATION *UNUSUAL SHORTNESS OF BREATH *UNUSUAL BRUISING OR BLEEDING *URINARY PROBLEMS (pain or burning when urinating, or frequent urination) *BOWEL PROBLEMS (unusual diarrhea, constipation, pain near the anus) TENDERNESS IN MOUTH AND THROAT WITH OR WITHOUT PRESENCE OF ULCERS (sore throat, sores in mouth, or a toothache) UNUSUAL RASH, SWELLING OR PAIN  UNUSUAL VAGINAL DISCHARGE OR ITCHING   Items with * indicate a potential emergency and should be followed up as soon as possible or go to the Emergency Department if any problems should occur.  Please show the CHEMOTHERAPY ALERT CARD or IMMUNOTHERAPY ALERT CARD at check-in  to the Emergency Department and triage nurse.  Should you have questions after your visit or need to cancel or reschedule your appointment, please contact [facility]  Dept: [phone]  and follow the prompts.  Office hours are 8:00 a.m. to 4:30 p.m. Monday - Friday. Please note that voicemails left after 4:00 p.m. may not be returned until the following business day.  We are closed weekends and major holidays. You have access to a nurse at all times for urgent questions. Please call the main number to the clinic Dept: [phone] and follow the prompts.   For any non-urgent questions, you may also contact your provider using MyChart. We now offer e-Visits for anyone 32 and older to request care online for non-urgent symptoms. For details visit [email or web address].   Also download the MyChart app! Go to the app store, search "MyChart", open the app, select [facility], and log in with your MyChart username and password.  Due to Covid, a mask is required upon entering the hospital/clinic. If you do not have a mask, one will be given to you upon arrival. For doctor visits, patients may have 1 support person aged 45 or older with them. For treatment visits, patients cannot have anyone with them due to current Covid guidelines and our immunocompromised population.

## 2020-09-06 NOTE — Progress Notes (Signed)
[place] [phone]   [phone]  OFFICE PROGRESS NOTE  [name], MD [place] [address]  DIAGNOSIS: Metastatic non-small cell lung cancer initially diagnosed as locally advanced stage IIIB with a right Pancoast tumor involving the vertebral body as well as prominent canal invasion with spinal cord compression in October 2007. The patient also has metastatic disease to the brain in April 2008.  PRIOR THERAPY: Status post concurrent chemoradiation with weekly carboplatin and paclitaxel, last dose was given November 18, 2005. 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 Status post gamma knife radiotherapy to a solitary brain lesion located in the superior frontal area of the brain at [facility] in April of 2008. Status post palliative radiotherapy to the lateral abdominal wall metastatic lesion under the care of Dr. Lisbeth Renshaw, completed March of 2009. Status post 6 cycles of systemic chemotherapy with carboplatin and Alimta. Last dose was given July 26, 2007 with disease stabilization. Gamma knife stereotactic radiotherapy to 2 brain lesions one involving the right frontal dural based as well as right parietal lesion performed on 05/07/2012 under the care of Dr. Vallarie Mare at [facility].  CURRENT THERAPY: Maintenance systemic chemotherapy with Alimta 500 MG/M2 every 3 weeks, status post 225 cycles.  INTERVAL HISTORY: Danielle [name] 70 y.o. female returns to the clinic today for follow-up visit.  The patient is feeling fine today with no concerning complaints.  She denied having any current chest pain, shortness of breath, cough or hemoptysis.  She continues to have intermittent nausea especially in the morning and she is using Zofran with improvement in her symptoms.  She has no vomiting, diarrhea or constipation.  She has no recent weight  loss or night sweats.  She has no headache or visual changes.  She continues to tolerate her treatment with maintenance Alimta fairly well.  She is here today for evaluation before starting cycle #226.   MEDICAL HISTORY: Past Medical History:  Diagnosis Date   Anemia    Antineoplastic chemotherapy induced anemia 01/23/2016   Carcinomas, basal cell 01/22/2019   two places on face   CVA (cerebral vascular accident) ([place]) 04/2014   pt states had 2 cva's within 2 wks   Diabetes mellitus without complication ([place])    DJD (degenerative joint disease), cervical    Fibromyalgia    H/O: pneumonia    History of tobacco abuse quit 10/08   on nicotine patch   Hypertension    Hypokalemia    Lung cancer ([place]) dx'd 09/2005   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 had chemo and radiation for lung ca   Thrush 12/11/2010    ALLERGIES:  is allergic to contrast media [iodinated diagnostic agents], iohexol, sulfa drugs cross reactors, and co-trimoxazole injection [sulfamethoxazole-trimethoprim].  MEDICATIONS:  Current Outpatient Medications  Medication Sig Dispense Refill   albuterol (VENTOLIN HFA) 108 (90 Base) MCG/ACT inhaler TAKE 2 PUFFS BY MOUTH EVERY 6 HOURS AS NEEDED FOR WHEEZE OR SHORTNESS OF BREATH 18 each 2   alendronate (FOSAMAX) 70 MG tablet Take 1 tablet (70 mg total) by mouth every 7 (seven) days. Take with a full glass of water on an empty stomach. 4 tablet 11   amLODipine (NORVASC) 10 MG tablet TAKE 1 TABLET BY MOUTH EVERY DAY 90 tablet 0   aspirin 325 MG EC tablet TAKE 1 TABLET BY MOUTH EVERY DAY 90 tablet 0  atorvastatin (LIPITOR) 40 MG tablet TAKE 1 TABLET BY MOUTH EVERY DAY AT 6 PM 90 tablet 3   Cholecalciferol (VITAMIN D3) 50 MCG (2000 UT) TABS Take by mouth.     diphenhydrAMINE (BENADRYL) 25 MG tablet Take 1 tablet (25 mg total) by mouth as directed. 2 hours before CT scan. 5 tablet 0   FeFum-FePoly-FA-B Cmp-C-Biot (INTEGRA PLUS) CAPS TAKE 1 CAPSULE BY MOUTH EVERY DAY IN THE  MORNING 90 capsule 3   fluticasone (FLONASE) 50 MCG/ACT nasal spray Place 1-2 sprays into both nostrils daily as needed. 16 g 5   folic acid (FOLVITE) 1 MG tablet TAKE 1 TABLET BY MOUTH EVERY DAY 90 tablet 1   furosemide (LASIX) 20 MG tablet TAKE 1 TABLET BY MOUTH EVERY DAY AS NEEDED FOR SWELLING 20 tablet 1   INCRUSE ELLIPTA 62.5 MCG/INH AEPB TAKE 1 PUFF BY MOUTH EVERY DAY 30 each 2   lidocaine-prilocaine (EMLA) cream Apply to the Port-A-Cath site 30-60-minute before treatment. 30 g 0   lisinopril (ZESTRIL) 20 MG tablet Take 2 tablets (40 mg total) by mouth daily. 180 tablet 1   morphine (MS CONTIN) 60 MG 12 hr tablet Take 1 tablet (60 mg total) by mouth 2 (two) times daily. 60 tablet 0   morphine (MSIR) 30 MG tablet Take 1 tablet (30 mg total) by mouth every 4 (four) hours as needed (breakthrough pain). 60 tablet 0   Olopatadine HCl 0.2 % SOLN INSTILL 1 DROP INTO AFFECTED EYE(S) EVERY DAY 2.5 mL 1   ondansetron (ZOFRAN) 8 MG tablet TAKE 1 TABLET BY MOUTH EVERY 8 HOURS AS NEEDED FOR NAUSEA AND VOMITING 20 tablet 0   predniSONE (DELTASONE) 50 MG tablet 1 tablet by mouth 13 hours ,7 hours and 1 hour  before CT scan 3 tablet 4   prochlorperazine (COMPAZINE) 10 MG tablet Take 1 tablet (10 mg total) by mouth every 6 (six) hours as needed. 30 tablet 2   triamcinolone (KENALOG) 0.025 % ointment APPLY TO AFFECTED AREA TWICE A DAY 30 g 0   No current facility-administered medications for this visit.    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   LOOP RECORDER IMPLANT N/A 04/19/2014   Procedure: LOOP RECORDER IMPLANT;  Surgeon: Thompson Grayer, MD;  Location[facility] C[facility] LAB;  Service: Cardiovascular;  Laterality: N/A;   OTHER SURGICAL HISTORY  2008   Gamma knife surgery to remove brain met    PORTACATH PLACEMENT  -[name]   TIP IN CAVOATRIAL  JUNCTION   TEE WITHOUT CARDIOVERSION N/A 04/19/2014   Procedure: TRANSESOPHAGEAL ECHOCARDIOGRAM (TEE);  Surgeon: Larey Dresser, MD;  Location: [place];  Service: Cardiovascular;  Laterality: N/A;    REVIEW OF SYSTEMS:  A comprehensive review of systems was negative except for: Constitutional: positive for fatigue Gastrointestinal: positive for nausea   PHYSICAL EXAMINATION: General appearance: alert, cooperative, fatigued, and no distress Head: Normocephalic, without obvious abnormality, atraumatic Neck: no adenopathy, no JVD, supple, symmetrical, trachea midline, and thyroid not enlarged, symmetric, no tenderness/mass/nodules Lymph nodes: Cervical, supraclavicular, and axillary nodes normal. Resp: clear to auscultation bilaterally Back: symmetric, no curvature. ROM normal. No CVA tenderness. Cardio: regular rate and rhythm, S1, S2 normal, no murmur, click, rub or gallop GI: soft, non-tender; bowel sounds normal; no masses,  no organomegaly Extremities: extremities normal,  atraumatic, no cyanosis or edema  ECOG PERFORMANCE STATUS: 1 - Symptomatic but completely ambulatory  Blood pressure (!) 148/71, pulse 93, temperature 97.7 F (36.5 C), temperature source Tympanic, resp. rate 20, height 5' 2.75" (1.594 m), weight 145 lb 12.8 oz (66.1 kg), SpO2 96 %.  LABORATORY DATA: Lab Results  Component Value Date   WBC 10.4 09/06/2020   HGB 11.3 (L) 09/06/2020   HCT 35.0 (L) 09/06/2020   MCV 97.2 09/06/2020   PLT 421 (H) 09/06/2020      Chemistry      Component Value Date/Time   NA 142 09/06/2020 0914   NA 142 12/24/2016 0816   K 4.2 09/06/2020 0914   K 4.4 12/24/2016 0816   CL 108 09/06/2020 0914   CL 106 06/30/2012 1004   CO2 23 09/06/2020 0914   CO2 28 12/24/2016 0816   BUN 13 09/06/2020 0914   BUN 15.8 12/24/2016 0816   CREATININE 0.80 09/06/2020 0914   CREATININE 0.82 05/02/2020 1036   CREATININE 1.1 12/24/2016 0816      Component Value Date/Time   CALCIUM 9.9  09/06/2020 0914   CALCIUM 9.6 12/24/2016 0816   ALKPHOS 71 09/06/2020 0914   ALKPHOS 80 12/24/2016 0816   AST 25 09/06/2020 0914   AST 24 05/02/2020 1036   AST 20 12/24/2016 0816   ALT 13 09/06/2020 0914   ALT 10 05/02/2020 1036   ALT 11 12/24/2016 0816   BILITOT 0.5 09/06/2020 0914   BILITOT 0.4 05/02/2020 1036   BILITOT 0.34 12/24/2016 0816       RADIOGRAPHIC STUDIES: No results found.  ASSESSMENT AND PLAN: This is a very pleasant 70 years old white female with metastatic non-small cell lung cancer diagnosed in October 2007 status post concurrent chemoradiation followed by consolidation chemotherapy and she is currently on maintenance treatment with single agent Alimta status post 225 cycles. The patient continues to tolerate her treatment well with no concerning adverse effects. I recommended her to proceed with cycle #225 today as planned. I will see the patient back for follow-up visit in 3 weeks for evaluation before the next cycle of her treatment. She was advised to call immediately if she has any other concerning symptoms in the interval. The patient voices understanding of current disease status and treatment options and is in agreement with the current care plan. All questions were answered. The patient knows to call the clinic with any problems, questions or concerns. We can certainly see the patient much sooner if necessary.  Disclaimer: This note was dictated with voice recognition software. Similar sounding words can inadvertently be transcribed and may not be corrected upon review.

## 2020-09-11 ENCOUNTER — Other Ambulatory Visit: Payer: Self-pay | Admitting: Internal Medicine

## 2020-09-12 ENCOUNTER — Other Ambulatory Visit: Payer: Self-pay

## 2020-09-12 ENCOUNTER — Ambulatory Visit (INDEPENDENT_AMBULATORY_CARE_PROVIDER_SITE_OTHER): Payer: Medicare (Managed Care) | Admitting: Family Medicine

## 2020-09-12 ENCOUNTER — Encounter: Payer: Self-pay | Admitting: Family Medicine

## 2020-09-12 VITALS — BP 128/60 | HR 76 | Temp 97.7°F | Ht 62.75 in | Wt 144.0 lb

## 2020-09-12 DIAGNOSIS — C3490 Malignant neoplasm of unspecified part of unspecified bronchus or lung: Secondary | ICD-10-CM | POA: Diagnosis not present

## 2020-09-12 DIAGNOSIS — E1159 Type 2 diabetes mellitus with other circulatory complications: Secondary | ICD-10-CM

## 2020-09-12 DIAGNOSIS — E119 Type 2 diabetes mellitus without complications: Secondary | ICD-10-CM | POA: Diagnosis not present

## 2020-09-12 DIAGNOSIS — E785 Hyperlipidemia, unspecified: Secondary | ICD-10-CM

## 2020-09-12 DIAGNOSIS — I152 Hypertension secondary to endocrine disorders: Secondary | ICD-10-CM

## 2020-09-12 DIAGNOSIS — I700 Atherosclerosis of aorta: Secondary | ICD-10-CM | POA: Diagnosis not present

## 2020-09-12 DIAGNOSIS — E1169 Type 2 diabetes mellitus with other specified complication: Secondary | ICD-10-CM | POA: Diagnosis not present

## 2020-09-12 DIAGNOSIS — C3411 Malignant neoplasm of upper lobe, right bronchus or lung: Secondary | ICD-10-CM

## 2020-09-12 DIAGNOSIS — J449 Chronic obstructive pulmonary disease, unspecified: Secondary | ICD-10-CM

## 2020-09-12 LAB — HM DIABETES FOOT EXAM

## 2020-09-12 NOTE — Progress Notes (Signed)
Patient ID: Danielle [name], female    DOB: [birth year], 70 y.o.   MRN: [id number]  This visit was conducted in person.  BP 128/60   Pulse 76   Temp 97.7 F (36.5 C) (Temporal)   Ht 5' 2.75" (1.594 m)   Wt 144 lb (65.3 kg)   SpO2 93%   BMI 25.71 kg/m    CC: Chief Complaint  Patient presents with   Annual Exam    Subjective:   HPI: Danielle [name] is a 70 y.o. female presenting on 09/12/2020 for Annual Exam  The patient presents for review of chronic health problems. He/She also has the following acute concerns today:  Feels bad after chemo on Wed... N/V.  Keeping up with fluids.   Hypertension:    BP Readings from Last 3 Encounters:  09/12/20 128/60  09/06/20 (!) 148/71  08/15/20 (!) 131/50  Using medication without problems or lightheadedness:  none Chest pain with exertion: none Edema:none Short of breath: stable Average home BPs: Other issues:   Using cane now given stability worsened. Fall Risk  09/12/2020 08/31/2019 08/28/2018 08/05/2017 08/09/2016  Falls in the past year? 0 0 0 No No  Number falls in past yr: 0 0 - - -  Injury with Fall? 0 0 - - -  Risk for fall due to : - Medication side effect - - -  Follow up - Falls evaluation completed;Falls prevention discussed - - -     Elevated Cholesterol: Goal  < 70 (given past CVA) On statin.  Skipping off and on after chemo. Lab Results  Component Value Date   CHOL 114 09/04/2020   HDL 38.00 (L) 09/04/2020   LDLCALC 54 09/04/2020   LDLDIRECT 105.5 08/06/2013   TRIG 112.0 09/04/2020   CHOLHDL 3 09/04/2020  Using medications without problems: Muscle aches:  Diet compliance: good Exercise: walking several days a week Other complaints:  Diabetes:   Diet controlled Lab Results  Component Value Date   HGBA1C 5.7 09/04/2020  Using medications without difficulties: Hypoglycemic episodes: Hyperglycemic episodes: Feet problems: no ulcer Blood Sugars averaging: eye exam within last year: yes       Relevant past medical, surgical, family and social history reviewed and updated as indicated. Interim medical history since our last visit reviewed. Allergies and medications reviewed and updated. Outpatient Medications Prior to Visit  Medication Sig Dispense Refill   albuterol (VENTOLIN HFA) 108 (90 Base) MCG/ACT inhaler TAKE 2 PUFFS BY MOUTH EVERY 6 HOURS AS NEEDED FOR WHEEZE OR SHORTNESS OF BREATH 18 each 2   alendronate (FOSAMAX) 70 MG tablet Take 1 tablet (70 mg total) by mouth every 7 (seven) days. Take with a full glass of water on an empty stomach. 4 tablet 11   amLODipine (NORVASC) 10 MG tablet TAKE 1 TABLET BY MOUTH EVERY DAY 90 tablet 0   aspirin 325 MG EC tablet TAKE 1 TABLET BY MOUTH EVERY DAY 90 tablet 0   atorvastatin (LIPITOR) 40 MG tablet TAKE 1 TABLET BY MOUTH EVERY DAY AT 6 PM 90 tablet 3   Cholecalciferol (VITAMIN D3) 50 MCG (2000 UT) TABS Take by mouth.     diphenhydrAMINE (BENADRYL) 25 MG tablet Take 1 tablet (25 mg total) by mouth as directed. 2 hours before CT scan. 5 tablet 0   FeFum-FePoly-FA-B Cmp-C-Biot (INTEGRA PLUS) CAPS TAKE 1 CAPSULE BY MOUTH EVERY DAY IN THE MORNING 90 capsule 3   fluticasone (FLONASE) 50 MCG/ACT nasal spray Place 1-2 sprays into both nostrils  daily as needed. 16 g 5   folic acid (FOLVITE) 1 MG tablet TAKE 1 TABLET BY MOUTH EVERY DAY 90 tablet 1   furosemide (LASIX) 20 MG tablet TAKE 1 TABLET BY MOUTH EVERY DAY AS NEEDED FOR SWELLING 20 tablet 1   INCRUSE ELLIPTA 62.5 MCG/INH AEPB TAKE 1 PUFF BY MOUTH EVERY DAY 30 each 2   lidocaine-prilocaine (EMLA) cream Apply to the Port-A-Cath site 30-60-minute before treatment. 30 g 0   lisinopril (ZESTRIL) 20 MG tablet Take 2 tablets (40 mg total) by mouth daily. 180 tablet 1   morphine (MS CONTIN) 60 MG 12 hr tablet Take 1 tablet (60 mg total) by mouth 2 (two) times daily. 60 tablet 0   morphine (MSIR) 30 MG tablet Take 1 tablet (30 mg total) by mouth every 4 (four) hours as needed (breakthrough pain).  60 tablet 0   Olopatadine HCl 0.2 % SOLN INSTILL 1 DROP INTO AFFECTED EYE(S) EVERY DAY 2.5 mL 1   ondansetron (ZOFRAN) 8 MG tablet TAKE 1 TABLET BY MOUTH EVERY 8 HOURS AS NEEDED FOR NAUSEA AND VOMITING 20 tablet 0   predniSONE (DELTASONE) 50 MG tablet 1 tablet by mouth 13 hours ,7 hours and 1 hour  before CT scan 3 tablet 4   prochlorperazine (COMPAZINE) 10 MG tablet Take 1 tablet (10 mg total) by mouth every 6 (six) hours as needed. 30 tablet 2   triamcinolone (KENALOG) 0.025 % ointment APPLY TO AFFECTED AREA TWICE A DAY 30 g 0   No facility-administered medications prior to visit.     Per HPI unless specifically indicated in ROS section below Review of Systems  Constitutional:  Negative for fatigue and fever.  HENT:  Negative for congestion.   Eyes:  Negative for pain.  Respiratory:  Positive for cough. Negative for shortness of breath.   Cardiovascular:  Negative for chest pain, palpitations and leg swelling.  Gastrointestinal:  Positive for nausea and vomiting. Negative for abdominal pain.  Genitourinary:  Negative for dysuria and vaginal bleeding.  Musculoskeletal:  Negative for back pain.  Neurological:  Negative for syncope, light-headedness and headaches.  Psychiatric/Behavioral:  Negative for dysphoric mood.   Objective:  BP 128/60   Pulse 76   Temp 97.7 F (36.5 C) (Temporal)   Ht 5' 2.75" (1.594 m)   Wt 144 lb (65.3 kg)   SpO2 93%   BMI 25.71 kg/m   Wt Readings from Last 3 Encounters:  09/12/20 144 lb (65.3 kg)  09/06/20 145 lb 12.8 oz (66.1 kg)  08/15/20 147 lb 1.6 oz (66.7 kg)      Physical Exam Constitutional:      General: She is not in acute distress.    Appearance: Normal appearance. She is well-developed. She is not ill-appearing or toxic-appearing.  HENT:     Head: Normocephalic.     Right Ear: Hearing, tympanic membrane, ear canal and external ear normal. Tympanic membrane is not erythematous, retracted or bulging.     Left Ear: Hearing, tympanic  membrane, ear canal and external ear normal. Tympanic membrane is not erythematous, retracted or bulging.     Nose: No mucosal edema or rhinorrhea.     Right Sinus: No maxillary sinus tenderness or frontal sinus tenderness.     Left Sinus: No maxillary sinus tenderness or frontal sinus tenderness.     Mouth/Throat:     Pharynx: Uvula midline.  Eyes:     General: Lids are normal. Lids are everted, no foreign bodies appreciated.  Conjunctiva/sclera: Conjunctivae normal.     Pupils: Pupils are equal, round, and reactive to light.  Neck:     Thyroid: No thyroid mass or thyromegaly.     Vascular: No carotid bruit.     Trachea: Trachea normal.  Cardiovascular:     Rate and Rhythm: Normal rate and regular rhythm.     Pulses: Normal pulses.     Heart sounds: Normal heart sounds, S1 normal and S2 normal. No murmur heard.   No friction rub. No gallop.  Pulmonary:     Effort: Pulmonary effort is normal. No tachypnea or respiratory distress.     Breath sounds: Normal breath sounds. No decreased breath sounds, wheezing, rhonchi or rales.  Abdominal:     General: Bowel sounds are normal.     Palpations: Abdomen is soft.     Tenderness: There is no abdominal tenderness.  Musculoskeletal:     Cervical back: Normal range of motion and neck supple.  Skin:    General: Skin is warm and dry.     Findings: No rash.  Neurological:     Mental Status: She is alert.  Psychiatric:        Mood and Affect: Mood is not anxious or depressed.        Speech: Speech normal.        Behavior: Behavior normal. Behavior is cooperative.        Thought Content: Thought content normal.        Judgment: Judgment normal.      Diabetic foot exam: Normal inspection No skin breakdown No calluses  Normal DP pulses Normal sensation to light touch and monofilament Nails normal  Results for orders placed or performed in visit on 09/06/20  CBC with Differential  Result Value Ref Range   WBC 10.4 4.0 - 10.5 K/uL    RBC 3.60 (L) 3.87 - 5.11 MIL/uL   Hemoglobin 11.3 (L) 12.0 - 15.0 g/dL   HCT 35.0 (L) 36.0 - 46.0 %   MCV 97.2 80.0 - 100.0 fL   MCH 31.4 26.0 - 34.0 pg   MCHC 32.3 30.0 - 36.0 g/dL   RDW 15.1 11.5 - 15.5 %   Platelets 421 (H) 150 - 400 K/uL   nRBC 0.0 0.0 - 0.2 %   Neutrophils Relative % 64 %   Neutro Abs 6.7 1.7 - 7.7 K/uL   Lymphocytes Relative 23 %   Lymphs Abs 2.4 0.7 - 4.0 K/uL   Monocytes Relative 11 %   Monocytes Absolute 1.1 (H) 0.1 - 1.0 K/uL   Eosinophils Relative 1 %   Eosinophils Absolute 0.1 0.0 - 0.5 K/uL   Basophils Relative 0 %   Basophils Absolute 0.0 0.0 - 0.1 K/uL   Immature Granulocytes 1 %   Abs Immature Granulocytes 0.06 0.00 - 0.07 K/uL  Comprehensive metabolic panel  Result Value Ref Range   Sodium 142 135 - 145 mmol/L   Potassium 4.2 3.5 - 5.1 mmol/L   Chloride 108 98 - 111 mmol/L   CO2 23 22 - 32 mmol/L   Glucose, Bld 156 (H) 70 - 99 mg/dL   BUN 13 8 - 23 mg/dL   Creatinine, Ser 0.80 0.44 - 1.00 mg/dL   Calcium 9.9 8.9 - 10.3 mg/dL   Total Protein 7.2 6.5 - 8.1 g/dL   Albumin 3.4 (L) 3.5 - 5.0 g/dL   AST 25 15 - 41 U/L   ALT 13 0 - 44 U/L   Alkaline Phosphatase 71 38 -  126 U/L   Total Bilirubin 0.5 0.3 - 1.2 mg/dL   GFR, Estimated >60 >60 mL/min   Anion gap 11 5 - 15   *Note: Due to a large number of results and/or encounters for the requested time period, some results have not been displayed. A complete set of results can be found in Results Review.    This visit occurred during the SARS-CoV-2 public health emergency.  Safety protocols were in place, including screening questions prior to the visit, additional usage of staff PPE, and extensive cleaning of exam room while observing appropriate contact time as indicated for disinfecting solutions.   COVID 19 screen:  No recent travel or known exposure to COVID19 The patient denies respiratory symptoms of COVID 19 at this time. The importance of social distancing was discussed today.    Assessment and Plan The patient's preventative maintenance and recommended screening tests for an annual wellness exam were reviewed in full today. Brought up to date unless services declined.  Counselled on the importance of diet, exercise, and its role in overall health and mortality. The patient's FH and SH was reviewed, including their home life, tobacco status, and drug and alcohol status.   Per pt got  flu shot at chemo.   COVID x 3 Hep C screening - completed HIV screening - completed A1C - completed Mammogram/ breast exam - declined PAP smear - declined Shingles - contraindicated Eye exam - pt reported exam in 2022 Colonoscopy: declined  Problem List Items Addressed This Visit     Cancer of upper lobe of right lung ([place]) (Chronic)    Active, in chemo, followed by oncology.      Diabetes mellitus with no complication (HCC) (Chronic)    Stable chronic, diet controlled.      Hyperlipidemia associated with type 2 diabetes mellitus (HCC) (Chronic)    Stable, chronic.  Continue current medication.   atorvastatin 40 mg daily. '      Hypertension associated with diabetes ([place]) (Chronic)    Stable, chronic.  Continue current medication.   amlodipine 10 mg daily  lisinopril 20 mg daily      Lung cancer, primary, with metastasis from lung to other site, unspecified laterality ([place]) - Primary (Chronic)   Atherosclerosis of aorta (HCC)    Rsik reduction on statin, former smoker      COPD, moderate (HCC)    Chronic, Stable control on incruse ellipta and prn albuterol.          [name], MD

## 2020-09-12 NOTE — Assessment & Plan Note (Signed)
Chronic, Stable control on incruse ellipta and prn albuterol.

## 2020-09-12 NOTE — Patient Instructions (Signed)
Get 4th COVID booster dose when able before the winter.  Ask chemo to send [place] the  date of the flu shot you received last week.

## 2020-09-12 NOTE — Assessment & Plan Note (Signed)
Rsik reduction on statin, former smoker

## 2020-09-12 NOTE — Assessment & Plan Note (Signed)
Stable chronic, diet controlled.

## 2020-09-12 NOTE — Assessment & Plan Note (Signed)
Active, in chemo, followed by oncology.

## 2020-09-12 NOTE — Assessment & Plan Note (Signed)
Stable, chronic.  Continue current medication.   atorvastatin 40 mg daily. '

## 2020-09-12 NOTE — Assessment & Plan Note (Signed)
Stable, chronic.  Continue current medication.   amlodipine 10 mg daily  lisinopril 20 mg daily

## 2020-09-21 ENCOUNTER — Telehealth: Payer: Self-pay

## 2020-09-21 NOTE — Chronic Care Management (AMB) (Addendum)
Chronic Care Management Pharmacy Assistant   Name: Danielle [name]  MRN: [id number] DOB: 1950/05/30  Reason for Encounter: General Adherence   Recent office visits:  09/12/20-PCP-Patient presented for AWV.No medication changes follow up 6 months 07/05/20-PCP- Restarted the patient on Lisinopril 40mg .  Recent consult visits:  09/27/20-Oncology-Patient presented for follow up metastatic lung cancer.Patient tolerating chemotherapy.Follow up 3 weeks 09/06/20-Oncology- Follow up lung CA. 08/15/20-Oncology- Follow up Lung CA 07/25/20-Oncology- Follow up Lung CA  07/04/20-Oncology-Follow up Lung CA 06/13/20-Oncology-Follow up CA, Patient presented with dental abcess currently on Amoxicillin. 05/23/20-Oncology-treatment,05/02/20-Oncologytreatment,04/11/20-Oncology treatment.  Hospital visits:  None in previous 6 months  Medications: Outpatient Encounter Medications as of 09/21/2020  Medication Sig   albuterol (VENTOLIN HFA) 108 (90 Base) MCG/ACT inhaler TAKE 2 PUFFS BY MOUTH EVERY 6 HOURS AS NEEDED FOR WHEEZE OR SHORTNESS OF BREATH   alendronate (FOSAMAX) 70 MG tablet Take 1 tablet (70 mg total) by mouth every 7 (seven) days. Take with a full glass of water on an empty stomach.   amLODipine (NORVASC) 10 MG tablet TAKE 1 TABLET BY MOUTH EVERY DAY   aspirin 325 MG EC tablet TAKE 1 TABLET BY MOUTH EVERY DAY   atorvastatin (LIPITOR) 40 MG tablet TAKE 1 TABLET BY MOUTH EVERY DAY AT 6 PM   Cholecalciferol (VITAMIN D3) 50 MCG (2000 UT) TABS Take by mouth.   diphenhydrAMINE (BENADRYL) 25 MG tablet Take 1 tablet (25 mg total) by mouth as directed. 2 hours before CT scan.   FeFum-FePoly-FA-B Cmp-C-Biot (INTEGRA PLUS) CAPS TAKE 1 CAPSULE BY MOUTH EVERY DAY IN THE MORNING   fluticasone (FLONASE) 50 MCG/ACT nasal spray Place 1-2 sprays into both nostrils daily as needed.   folic acid (FOLVITE) 1 MG tablet TAKE 1 TABLET BY MOUTH EVERY DAY   furosemide (LASIX) 20 MG tablet TAKE 1 TABLET BY MOUTH EVERY DAY AS  NEEDED FOR SWELLING   INCRUSE ELLIPTA 62.5 MCG/INH AEPB TAKE 1 PUFF BY MOUTH EVERY DAY   lidocaine-prilocaine (EMLA) cream Apply to the Port-A-Cath site 30-60-minute before treatment.   lisinopril (ZESTRIL) 20 MG tablet Take 2 tablets (40 mg total) by mouth daily.   morphine (MS CONTIN) 60 MG 12 hr tablet Take 1 tablet (60 mg total) by mouth 2 (two) times daily.   morphine (MSIR) 30 MG tablet Take 1 tablet (30 mg total) by mouth every 4 (four) hours as needed (breakthrough pain).   Olopatadine HCl 0.2 % SOLN INSTILL 1 DROP INTO AFFECTED EYE(S) EVERY DAY   ondansetron (ZOFRAN) 8 MG tablet TAKE 1 TABLET BY MOUTH EVERY 8 HOURS AS NEEDED FOR NAUSEA AND VOMITING   predniSONE (DELTASONE) 50 MG tablet 1 tablet by mouth 13 hours ,7 hours and 1 hour  before CT scan   prochlorperazine (COMPAZINE) 10 MG tablet Take 1 tablet (10 mg total) by mouth every 6 (six) hours as needed.   triamcinolone (KENALOG) 0.025 % ointment APPLY TO AFFECTED AREA TWICE A DAY   No facility-administered encounter medications on file as of 09/21/2020.    Attempted contact with Margarette Asal [facility] 3 times on 09/21/20,09/25/20,09/27/20. Unsuccessful outreach. Will attempt contact next month.   Patient is not > 5 days past due for refill on the following medications per chart history:  [facility] Medications: Medication Name/mg Last Fill Days Supply Lisinopril 20mg   07/05/20 90 Atorvastatin 40mg   09/21/20 90  Care Gaps: Annual wellness visit in last year? 09/12/20 Most Recent BP reading: 128/30  76-P (09/12/20)  If Diabetic: Most recent A1C reading:  5.7 (09/04/20)  Last eye exam / retinopathy screening: 06/19/20 Last diabetic foot exam: 09/12/20  Debbora Dus, CPP notified  Avel Sensor, [place] Assistant [phone]  I have reviewed the care management and care coordination activities outlined in this encounter and I am certifying that I agree with the content of this note. No further action required.  Debbora Dus, PharmD Clinical Pharmacist [facility] at [facility] [phone]

## 2020-09-22 NOTE — Progress Notes (Signed)
[facility] OFFICE PROGRESS NOTE  [name], MD [place] [address]  DIAGNOSIS: Metastatic non-small cell lung cancer initially diagnosed as locally advanced stage IIIB with a right Pancoast tumor involving the vertebral body as well as prominent canal invasion with spinal cord compression in October 2007. The patient also has metastatic disease to the brain in April 2008.  PRIOR THERAPY: Status post concurrent chemoradiation with weekly carboplatin and paclitaxel, last dose was given November 18, 2005. 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 Status post gamma knife radiotherapy to a solitary brain lesion located in the superior frontal area of the brain at [facility] in April of 2008. Status post palliative radiotherapy to the lateral abdominal wall metastatic lesion under the care of Dr. Lisbeth Renshaw, completed March of 2009. Status post 6 cycles of systemic chemotherapy with carboplatin and Alimta. Last dose was given July 26, 2007 with disease stabilization. Gamma knife stereotactic radiotherapy to 2 brain lesions one involving the right frontal dural based as well as right parietal lesion performed on 05/07/2012 under the care of Dr. Vallarie Mare at [facility]  CURRENT THERAPY: Maintenance systemic chemotherapy with Alimta 500 MG/M2 every 3 weeks, status post 226 cycles.  INTERVAL HISTORY: Carianne Taira Hildebrant 70 y.o. female returns to the clinic today for a follow up visit. She is feeling well today without any concerning complaints. She is tolerating her treatment with maintenance Alimta well without any adverse side effects except for occasional nausea/vomiting. She takes zofran for nausea and compazine if needed. She also had a B12 shot and experienced nausea/vomiting the following day and she wasn't sure if that was related. She denies any fever or chills. She  reportedly has baseline night sweats for several years, which are unchanged. She denies any hemoptysis, cough, chest pain.  She reports her baseline shortness of breath with exertion.  She denies any diarrhea or constipation.  She denies any headaches or visual changes.  She is here today for evaluation before starting cycle #227.   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 had chemo and radiation for lung ca   Thrush 12/11/2010    ALLERGIES:  is allergic to contrast media [iodinated diagnostic agents], iohexol, sulfa drugs cross reactors, and co-trimoxazole injection [sulfamethoxazole-trimethoprim].  MEDICATIONS:  Current Outpatient Medications  Medication Sig Dispense Refill   albuterol (VENTOLIN HFA) 108 (90 Base) MCG/ACT inhaler TAKE 2 PUFFS BY MOUTH EVERY 6 HOURS AS NEEDED FOR WHEEZE OR SHORTNESS OF BREATH 18 each 2   alendronate (FOSAMAX) 70 MG tablet Take 1 tablet (70 mg total) by mouth every 7 (seven) days. Take with a full glass of water on an empty stomach. 4 tablet 11   amLODipine (NORVASC) 10 MG tablet TAKE 1 TABLET BY MOUTH EVERY DAY 90 tablet 0   aspirin 325 MG EC  tablet TAKE 1 TABLET BY MOUTH EVERY DAY 90 tablet 0   atorvastatin (LIPITOR) 40 MG tablet TAKE 1 TABLET BY MOUTH EVERY DAY AT 6 PM 90 tablet 3   Cholecalciferol (VITAMIN D3) 50 MCG (2000 UT) TABS Take by mouth.     diphenhydrAMINE (BENADRYL) 25 MG tablet Take 1 tablet (25 mg total) by mouth as directed. 2  hours before CT scan. 5 tablet 0   FeFum-FePoly-FA-B Cmp-C-Biot (INTEGRA PLUS) CAPS TAKE 1 CAPSULE BY MOUTH EVERY DAY IN THE MORNING 90 capsule 3   fluticasone (FLONASE) 50 MCG/ACT nasal spray Place 1-2 sprays into both nostrils daily as needed. 16 g 5   folic acid (FOLVITE) 1 MG tablet TAKE 1 TABLET BY MOUTH EVERY DAY 90 tablet 1   furosemide (LASIX) 20 MG tablet TAKE 1 TABLET BY MOUTH EVERY DAY AS NEEDED FOR SWELLING 20 tablet 1   INCRUSE ELLIPTA 62.5 MCG/INH AEPB TAKE 1 PUFF BY MOUTH EVERY DAY 30 each 2   lidocaine-prilocaine (EMLA) cream Apply to the Port-A-Cath site 30-60-minute before treatment. 30 g 0   lisinopril (ZESTRIL) 20 MG tablet Take 2 tablets (40 mg total) by mouth daily. 180 tablet 1   morphine (MS CONTIN) 60 MG 12 hr tablet Take 1 tablet (60 mg total) by mouth 2 (two) times daily. 60 tablet 0   morphine (MSIR) 30 MG tablet Take 1 tablet (30 mg total) by mouth every 4 (four) hours as needed (breakthrough pain). 60 tablet 0   Olopatadine HCl 0.2 % SOLN INSTILL 1 DROP INTO AFFECTED EYE(S) EVERY DAY 2.5 mL 1   ondansetron (ZOFRAN) 8 MG tablet TAKE 1 TABLET BY MOUTH EVERY 8 HOURS AS NEEDED FOR NAUSEA AND VOMITING 20 tablet 0   predniSONE (DELTASONE) 50 MG tablet 1 tablet by mouth 13 hours ,7 hours and 1 hour  before CT scan 3 tablet 4   prochlorperazine (COMPAZINE) 10 MG tablet Take 1 tablet (10 mg total) by mouth every 6 (six) hours as needed. 30 tablet 2   triamcinolone (KENALOG) 0.025 % ointment APPLY TO AFFECTED AREA TWICE A DAY 30 g 0   No current facility-administered medications for this visit.    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   LOOP RECORDER IMPLANT N/A 04/19/2014   Procedure: LOOP RECORDER IMPLANT;  Surgeon: Thompson Grayer, MD;  Location: [facility] CATH LAB;  Service: Cardiovascular;  Laterality: N/A;   OTHER  SURGICAL HISTORY  2008   Gamma knife surgery to remove brain met    PORTACATH PLACEMENT  -[name]   TIP IN CAVOATRIAL JUNCTION   TEE WITHOUT CARDIOVERSION N/A 04/19/2014   Procedure: TRANSESOPHAGEAL ECHOCARDIOGRAM (TEE);  Surgeon: Larey Dresser, MD;  Location: [facility];  Service: Cardiovascular;  Laterality: N/A;    REVIEW OF SYSTEMS:   Review of Systems  Constitutional: Negative for appetite change, chills, fatigue, fever and unexpected weight change. HENT: Negative for mouth sores, nosebleeds, sore throat and trouble swallowing.   Eyes: Negative for eye problems and icterus. Respiratory: Positive for baseline shortness of breath with exertion. Negative for cough, hemoptysis, and wheezing.   Cardiovascular: Positive for bilateral lower extremity swelling. Negative for chest pain. Gastrointestinal: Positive for occasional nausea and vomiting. Negative for abdominal pain, constipation, and diarrhea. Genitourinary:  Negative for bladder incontinence, difficulty urinating, dysuria,  frequency and hematuria.   Musculoskeletal: Negative for back pain, gait problem, neck pain and neck stiffness. Skin: Positive for several scars on her skin. Negative for itching and rash. Neurological: Negative for dizziness, extremity weakness, gait problem, headaches, light-headedness and seizures. Hematological: Negative for adenopathy. Does not bruise/bleed easily. Psychiatric/Behavioral: Negative for confusion, depression and sleep disturbance. The patient is not nervous/anxious.     PHYSICAL EXAMINATION:  There were no vitals taken for this visit.  ECOG PERFORMANCE STATUS: 1 - Symptomatic but completely ambulatory  Physical Exam  Constitutional: Oriented to person, place, and time and well-developed, well-nourished, and in no distress.   HENT: Head: Normocephalic and atraumatic. Mouth/Throat: Oropharynx is clear and moist. No oropharyngeal exudate. Eyes: Conjunctivae are normal. Right eye  exhibits no discharge. Left eye exhibits no discharge. No scleral icterus. Neck: Normal range of motion. Neck supple. Cardiovascular: Normal rate, regular rhythm, normal heart sounds and intact distal pulses.   Pulmonary/Chest: Effort normal and breath sounds quiet in all lung fields. No respiratory distress. No wheezes. No rales. Abdominal: Soft. Bowel sounds are normal. Exhibits no distension and no mass. There is no tenderness.  Musculoskeletal: Positive for bilateral lower extremity edema. Normal range of motion.  Lymphadenopathy:    No cervical adenopathy.  Neurological: Alert and oriented to person, place, and time. Exhibits normal muscle tone. Gait normal. Coordination normal. Ambulates with a cane. Skin: Skin is warm and dry. No rash noted. Not diaphoretic. No erythema. No pallor.  Psychiatric: Mood, memory and judgment normal. Vitals reviewed.  LABORATORY DATA: Lab Results  Component Value Date   WBC 10.4 09/06/2020   HGB 11.3 (L) 09/06/2020   HCT 35.0 (L) 09/06/2020   MCV 97.2 09/06/2020   PLT 421 (H) 09/06/2020      Chemistry      Component Value Date/Time   NA 142 09/06/2020 0914   NA 142 12/24/2016 0816   K 4.2 09/06/2020 0914   K 4.4 12/24/2016 0816   CL 108 09/06/2020 0914   CL 106 06/30/2012 1004   CO2 23 09/06/2020 0914   CO2 28 12/24/2016 0816   BUN 13 09/06/2020 0914   BUN 15.8 12/24/2016 0816   CREATININE 0.80 09/06/2020 0914   CREATININE 0.82 05/02/2020 1036   CREATININE 1.1 12/24/2016 0816      Component Value Date/Time   CALCIUM 9.9 09/06/2020 0914   CALCIUM 9.6 12/24/2016 0816   ALKPHOS 71 09/06/2020 0914   ALKPHOS 80 12/24/2016 0816   AST 25 09/06/2020 0914   AST 24 05/02/2020 1036   AST 20 12/24/2016 0816   ALT 13 09/06/2020 0914   ALT 10 05/02/2020 1036   ALT 11 12/24/2016 0816   BILITOT 0.5 09/06/2020 0914   BILITOT 0.4 05/02/2020 1036   BILITOT 0.34 12/24/2016 0816       RADIOGRAPHIC STUDIES:  No results  found.   ASSESSMENT/PLAN:  This is a very pleasant 70 year old Caucasian female with metastatic non-small cell lung cancer who was initially diagnosed in October 2007.  She is status post current chemoradiation followed by consolidation chemotherapy.   She is currently undergoing weekly maintainance chemotherapy with single agent Alimta 500 mg/m.  She is status post 226 cycles. She is tolerating treatment well without any concerning complaints except mild nausea/vomiting.   Labs were reviewed. She will proceed cycle #227 today as scheduled.    We will see her for a follow up visit in 3 weeks for evaluation before starting cycle #227.  We will arrange for a restaging  CT scan at her next appointment.   The patient was advised to call immediately if she has any concerning symptoms in the interval. The patient voices understanding of current disease status and treatment options and is in agreement with the current care plan. All questions were answered. The patient knows to call the clinic with any problems, questions or concerns. We can certainly see the patient much sooner if necessary    No orders of the defined types were placed in this encounter.    The total time spent in the appointment was 20-29 minutes  Joylynn Defrancesco L Shawnae Leiva, PA-C 09/22/20

## 2020-09-26 MED FILL — Dexamethasone Sodium Phosphate Inj 100 MG/10ML: INTRAMUSCULAR | Qty: 1 | Status: AC

## 2020-09-27 ENCOUNTER — Other Ambulatory Visit: Payer: Self-pay

## 2020-09-27 ENCOUNTER — Inpatient Hospital Stay: Payer: Medicare (Managed Care)

## 2020-09-27 ENCOUNTER — Inpatient Hospital Stay: Payer: Medicare (Managed Care) | Attending: Physician Assistant

## 2020-09-27 ENCOUNTER — Inpatient Hospital Stay (HOSPITAL_BASED_OUTPATIENT_CLINIC_OR_DEPARTMENT_OTHER): Payer: Medicare (Managed Care) | Admitting: Physician Assistant

## 2020-09-27 VITALS — BP 145/72 | HR 91 | Temp 97.6°F | Resp 16 | Ht 62.75 in | Wt 147.8 lb

## 2020-09-27 DIAGNOSIS — Z79899 Other long term (current) drug therapy: Secondary | ICD-10-CM | POA: Diagnosis not present

## 2020-09-27 DIAGNOSIS — Z923 Personal history of irradiation: Secondary | ICD-10-CM | POA: Diagnosis not present

## 2020-09-27 DIAGNOSIS — R112 Nausea with vomiting, unspecified: Secondary | ICD-10-CM | POA: Diagnosis not present

## 2020-09-27 DIAGNOSIS — C3411 Malignant neoplasm of upper lobe, right bronchus or lung: Secondary | ICD-10-CM

## 2020-09-27 DIAGNOSIS — E119 Type 2 diabetes mellitus without complications: Secondary | ICD-10-CM | POA: Diagnosis not present

## 2020-09-27 DIAGNOSIS — Z95828 Presence of other vascular implants and grafts: Secondary | ICD-10-CM

## 2020-09-27 DIAGNOSIS — Z8673 Personal history of transient ischemic attack (TIA), and cerebral infarction without residual deficits: Secondary | ICD-10-CM | POA: Diagnosis not present

## 2020-09-27 DIAGNOSIS — I10 Essential (primary) hypertension: Secondary | ICD-10-CM | POA: Insufficient documentation

## 2020-09-27 DIAGNOSIS — C3410 Malignant neoplasm of upper lobe, unspecified bronchus or lung: Secondary | ICD-10-CM | POA: Diagnosis present

## 2020-09-27 DIAGNOSIS — C3490 Malignant neoplasm of unspecified part of unspecified bronchus or lung: Secondary | ICD-10-CM

## 2020-09-27 DIAGNOSIS — Z5111 Encounter for antineoplastic chemotherapy: Secondary | ICD-10-CM | POA: Insufficient documentation

## 2020-09-27 DIAGNOSIS — M47812 Spondylosis without myelopathy or radiculopathy, cervical region: Secondary | ICD-10-CM | POA: Insufficient documentation

## 2020-09-27 LAB — CBC WITH DIFFERENTIAL/PLATELET
Abs Immature Granulocytes: 0.04 10*3/uL (ref 0.00–0.07)
Basophils Absolute: 0 10*3/uL (ref 0.0–0.1)
Basophils Relative: 0 %
Eosinophils Absolute: 0 10*3/uL (ref 0.0–0.5)
Eosinophils Relative: 0 %
HCT: 34.8 % — ABNORMAL LOW (ref 36.0–46.0)
Hemoglobin: 11.4 g/dL — ABNORMAL LOW (ref 12.0–15.0)
Immature Granulocytes: 0 %
Lymphocytes Relative: 19 %
Lymphs Abs: 2 10*3/uL (ref 0.7–4.0)
MCH: 31.7 pg (ref 26.0–34.0)
MCHC: 32.8 g/dL (ref 30.0–36.0)
MCV: 96.7 fL (ref 80.0–100.0)
Monocytes Absolute: 1 10*3/uL (ref 0.1–1.0)
Monocytes Relative: 10 %
Neutro Abs: 7.4 10*3/uL (ref 1.7–7.7)
Neutrophils Relative %: 71 %
Platelets: 348 10*3/uL (ref 150–400)
RBC: 3.6 MIL/uL — ABNORMAL LOW (ref 3.87–5.11)
RDW: 14.9 % (ref 11.5–15.5)
WBC: 10.5 10*3/uL (ref 4.0–10.5)
nRBC: 0 % (ref 0.0–0.2)

## 2020-09-27 LAB — COMPREHENSIVE METABOLIC PANEL
ALT: 11 U/L (ref 0–44)
AST: 21 U/L (ref 15–41)
Albumin: 3.4 g/dL — ABNORMAL LOW (ref 3.5–5.0)
Alkaline Phosphatase: 73 U/L (ref 38–126)
Anion gap: 11 (ref 5–15)
BUN: 13 mg/dL (ref 8–23)
CO2: 25 mmol/L (ref 22–32)
Calcium: 9.8 mg/dL (ref 8.9–10.3)
Chloride: 106 mmol/L (ref 98–111)
Creatinine, Ser: 0.86 mg/dL (ref 0.44–1.00)
GFR, Estimated: >60 mL/min (ref >60)
Glucose, Bld: 129 mg/dL — ABNORMAL HIGH (ref 70–99)
Potassium: 4.2 mmol/L (ref 3.5–5.1)
Sodium: 142 mmol/L (ref 135–145)
Total Bilirubin: 0.4 mg/dL (ref 0.3–1.2)
Total Protein: 7 g/dL (ref 6.5–8.1)

## 2020-09-27 MED ORDER — SODIUM CHLORIDE 0.9% FLUSH
10.0000 mL | INTRAVENOUS | Status: DC | PRN
  Administered 2020-09-27: 10 mL via INTRAVENOUS

## 2020-09-27 MED ORDER — SODIUM CHLORIDE 0.9 % IV SOLN
10.0000 mg | Freq: Once | INTRAVENOUS | Status: AC
  Administered 2020-09-27: 10 mg via INTRAVENOUS
  Filled 2020-09-27: qty 10

## 2020-09-27 MED ORDER — SODIUM CHLORIDE 0.9% FLUSH
10.0000 mL | INTRAVENOUS | Status: DC | PRN
Start: 2020-09-27 — End: 2020-09-27
  Administered 2020-09-27: 10 mL

## 2020-09-27 MED ORDER — SODIUM CHLORIDE 0.9 % IV SOLN: Freq: Once | INTRAVENOUS | Status: AC

## 2020-09-27 MED ORDER — CYANOCOBALAMIN 1000 MCG/ML IJ SOLN: 1000.0000 ug | Freq: Once | INTRAMUSCULAR | Status: DC

## 2020-09-27 MED ORDER — SODIUM CHLORIDE 0.9 % IV SOLN
500.0000 mg/m2 | Freq: Once | INTRAVENOUS | Status: AC
  Administered 2020-09-27: 800 mg via INTRAVENOUS
  Filled 2020-09-27: qty 20

## 2020-09-27 MED ORDER — ONDANSETRON HCL 8 MG PO TABS
8.0000 mg | ORAL_TABLET | Freq: Once | ORAL | Status: AC
  Administered 2020-09-27: 8 mg via ORAL
  Filled 2020-09-27: qty 1

## 2020-09-27 MED ORDER — HEPARIN SOD (PORK) LOCK FLUSH 100 UNIT/ML IV SOLN
500.0000 [IU] | Freq: Once | INTRAVENOUS | Status: AC | PRN
  Administered 2020-09-27: 500 [IU]

## 2020-09-27 NOTE — Patient Instructions (Signed)
[facility]COL[facility]   Discharge Instructions: Thank you for choosing [facility] to provide your oncology and hematology care.   If you have a lab appointment with the [facility], please go directly to the [facility] and check in at the registration area.   Wear comfortable clothing and clothing appropriate for easy access to any Portacath or PICC line.   We strive to give you quality time with your provider. You may need to reschedule your appointment if you arrive late (15 or more minutes).  Arriving late affects you and other patients whose appointments are after yours.  Also, if you miss three or more appointments without notifying the office, you may be dismissed from the clinic at the provider's discretion.      For prescription refill requests, have your pharmacy contact our office and allow 72 hours for refills to be completed.    Today you received the following chemotherapy and/or immunotherapy agents: pemetrexed.      To help prevent nausea and vomiting after your treatment, we encourage you to take your nausea medication as directed.  BELOW ARE SYMPTOMS THAT SHOULD BE REPORTED IMMEDIATELY: *FEVER GREATER THAN 100.4 F (38 C) OR HIGHER *CHILLS OR SWEATING *NAUSEA AND VOMITING THAT IS NOT CONTROLLED WITH YOUR NAUSEA MEDICATION *UNUSUAL SHORTNESS OF BREATH *UNUSUAL BRUISING OR BLEEDING *URINARY PROBLEMS (pain or burning when urinating, or frequent urination) *BOWEL PROBLEMS (unusual diarrhea, constipation, pain near the anus) TENDERNESS IN MOUTH AND THROAT WITH OR WITHOUT PRESENCE OF ULCERS (sore throat, sores in mouth, or a toothache) UNUSUAL RASH, SWELLING OR PAIN  UNUSUAL VAGINAL DISCHARGE OR ITCHING   Items with * indicate a potential emergency and should be followed up as soon as possible or go to the Emergency Department if any problems should occur.  Please show the CHEMOTHERAPY ALERT CARD or IMMUNOTHERAPY ALERT CARD at check-in  to the Emergency Department and triage nurse.  Should you have questions after your visit or need to cancel or reschedule your appointment, please contact [facility]  Dept: [phone]  and follow the prompts.  Office hours are 8:00 a.m. to 4:30 p.m. Monday - Friday. Please note that voicemails left after 4:00 p.m. may not be returned until the following business day.  We are closed weekends and major holidays. You have access to a nurse at all times for urgent questions. Please call the main number to the clinic Dept: [phone] and follow the prompts.   For any non-urgent questions, you may also contact your provider using MyChart. We now offer e-Visits for anyone 69 and older to request care online for non-urgent symptoms. For details visit [email or web address].   Also download the MyChart app! Go to the app store, search "MyChart", open the app, select [facility], and log in with your MyChart username and password.  Due to Covid, a mask is required upon entering the hospital/clinic. If you do not have a mask, one will be given to you upon arrival. For doctor visits, patients may have 1 support person aged 61 or older with them. For treatment visits, patients cannot have anyone with them due to current Covid guidelines and our immunocompromised population.

## 2020-09-29 ENCOUNTER — Other Ambulatory Visit: Payer: Self-pay | Admitting: Family Medicine

## 2020-10-02 ENCOUNTER — Telehealth: Payer: Self-pay

## 2020-10-02 ENCOUNTER — Telehealth: Payer: Self-pay | Admitting: Medical Oncology

## 2020-10-02 ENCOUNTER — Other Ambulatory Visit: Payer: Self-pay | Admitting: Internal Medicine

## 2020-10-02 DIAGNOSIS — C3490 Malignant neoplasm of unspecified part of unspecified bronchus or lung: Secondary | ICD-10-CM

## 2020-10-02 DIAGNOSIS — C3410 Malignant neoplasm of upper lobe, unspecified bronchus or lung: Secondary | ICD-10-CM

## 2020-10-02 MED ORDER — MORPHINE SULFATE ER 60 MG PO TBCR: 60.0000 mg | EXTENDED_RELEASE_TABLET | Freq: Two times a day (BID) | ORAL | 0 refills | Status: DC

## 2020-10-02 NOTE — Telephone Encounter (Signed)
Pt called in request for refill of MORPHINE 60mg 

## 2020-10-02 NOTE — Telephone Encounter (Signed)
[facility] refill-Pt LVM requesting a refill on her MS Contin 60 mg bid.

## 2020-10-11 ENCOUNTER — Encounter: Payer: Self-pay | Admitting: Internal Medicine

## 2020-10-16 ENCOUNTER — Other Ambulatory Visit: Payer: Self-pay | Admitting: Family Medicine

## 2020-10-16 ENCOUNTER — Other Ambulatory Visit: Payer: Self-pay | Admitting: Internal Medicine

## 2020-10-16 ENCOUNTER — Encounter: Payer: Self-pay | Admitting: Internal Medicine

## 2020-10-16 DIAGNOSIS — C7931 Secondary malignant neoplasm of brain: Secondary | ICD-10-CM

## 2020-10-16 DIAGNOSIS — C3411 Malignant neoplasm of upper lobe, right bronchus or lung: Secondary | ICD-10-CM

## 2020-10-16 NOTE — Telephone Encounter (Signed)
   Notes to clinic Pt not associated with this practice. Please assess.

## 2020-10-17 MED FILL — Dexamethasone Sodium Phosphate Inj 100 MG/10ML: INTRAMUSCULAR | Qty: 1 | Status: AC

## 2020-10-18 ENCOUNTER — Inpatient Hospital Stay: Payer: Medicare Other | Attending: Physician Assistant

## 2020-10-18 ENCOUNTER — Inpatient Hospital Stay: Payer: Medicare Other

## 2020-10-18 ENCOUNTER — Inpatient Hospital Stay (HOSPITAL_BASED_OUTPATIENT_CLINIC_OR_DEPARTMENT_OTHER): Payer: Medicare Other | Admitting: Internal Medicine

## 2020-10-18 ENCOUNTER — Encounter: Payer: Self-pay | Admitting: Internal Medicine

## 2020-10-18 ENCOUNTER — Other Ambulatory Visit: Payer: Self-pay

## 2020-10-18 VITALS — BP 151/62 | HR 90 | Temp 97.1°F | Resp 20 | Ht 62.75 in | Wt 148.9 lb

## 2020-10-18 DIAGNOSIS — Z923 Personal history of irradiation: Secondary | ICD-10-CM | POA: Insufficient documentation

## 2020-10-18 DIAGNOSIS — C3490 Malignant neoplasm of unspecified part of unspecified bronchus or lung: Secondary | ICD-10-CM

## 2020-10-18 DIAGNOSIS — C7931 Secondary malignant neoplasm of brain: Secondary | ICD-10-CM | POA: Diagnosis not present

## 2020-10-18 DIAGNOSIS — Z95828 Presence of other vascular implants and grafts: Secondary | ICD-10-CM

## 2020-10-18 DIAGNOSIS — D6481 Anemia due to antineoplastic chemotherapy: Secondary | ICD-10-CM | POA: Diagnosis not present

## 2020-10-18 DIAGNOSIS — Z5111 Encounter for antineoplastic chemotherapy: Secondary | ICD-10-CM | POA: Insufficient documentation

## 2020-10-18 DIAGNOSIS — Z23 Encounter for immunization: Secondary | ICD-10-CM | POA: Insufficient documentation

## 2020-10-18 DIAGNOSIS — T451X5A Adverse effect of antineoplastic and immunosuppressive drugs, initial encounter: Secondary | ICD-10-CM

## 2020-10-18 DIAGNOSIS — C3411 Malignant neoplasm of upper lobe, right bronchus or lung: Secondary | ICD-10-CM

## 2020-10-18 DIAGNOSIS — C3410 Malignant neoplasm of upper lobe, unspecified bronchus or lung: Secondary | ICD-10-CM | POA: Diagnosis not present

## 2020-10-18 LAB — CBC WITH DIFFERENTIAL/PLATELET
Abs Immature Granulocytes: 0.02 10*3/uL (ref 0.00–0.07)
Basophils Absolute: 0 10*3/uL (ref 0.0–0.1)
Basophils Relative: 0 %
Eosinophils Absolute: 0 10*3/uL (ref 0.0–0.5)
Eosinophils Relative: 0 %
HCT: 32.5 % — ABNORMAL LOW (ref 36.0–46.0)
Hemoglobin: 10.6 g/dL — ABNORMAL LOW (ref 12.0–15.0)
Immature Granulocytes: 0 %
Lymphocytes Relative: 23 %
Lymphs Abs: 2 10*3/uL (ref 0.7–4.0)
MCH: 31.7 pg (ref 26.0–34.0)
MCHC: 32.6 g/dL (ref 30.0–36.0)
MCV: 97.3 fL (ref 80.0–100.0)
Monocytes Absolute: 0.9 10*3/uL (ref 0.1–1.0)
Monocytes Relative: 10 %
Neutro Abs: 5.9 10*3/uL (ref 1.7–7.7)
Neutrophils Relative %: 67 %
Platelets: 383 10*3/uL (ref 150–400)
RBC: 3.34 MIL/uL — ABNORMAL LOW (ref 3.87–5.11)
RDW: 14 % (ref 11.5–15.5)
WBC: 8.9 10*3/uL (ref 4.0–10.5)
nRBC: 0 % (ref 0.0–0.2)

## 2020-10-18 LAB — CMP (CANCER CENTER ONLY)
ALT: 13 U/L (ref 0–44)
AST: 21 U/L (ref 15–41)
Albumin: 3.2 g/dL — ABNORMAL LOW (ref 3.5–5.0)
Alkaline Phosphatase: 54 U/L (ref 38–126)
Anion gap: 5 (ref 5–15)
BUN: 24 mg/dL — ABNORMAL HIGH (ref 8–23)
CO2: 29 mmol/L (ref 22–32)
Calcium: 9.3 mg/dL (ref 8.9–10.3)
Chloride: 110 mmol/L (ref 98–111)
Creatinine: 0.97 mg/dL (ref 0.44–1.00)
GFR, Estimated: >60 mL/min (ref >60)
Glucose, Bld: 147 mg/dL — ABNORMAL HIGH (ref 70–99)
Potassium: 4.4 mmol/L (ref 3.5–5.1)
Sodium: 144 mmol/L (ref 135–145)
Total Bilirubin: 0.3 mg/dL (ref 0.3–1.2)
Total Protein: 6.6 g/dL (ref 6.5–8.1)

## 2020-10-18 MED ORDER — SODIUM CHLORIDE 0.9% FLUSH
10.0000 mL | INTRAVENOUS | Status: DC | PRN
  Administered 2020-10-18: 10 mL

## 2020-10-18 MED ORDER — INFLUENZA VAC A&B SA ADJ QUAD 0.5 ML IM PRSY
0.5000 mL | PREFILLED_SYRINGE | Freq: Once | INTRAMUSCULAR | Status: AC
  Administered 2020-10-18: 0.5 mL via INTRAMUSCULAR
  Filled 2020-10-18: qty 0.5

## 2020-10-18 MED ORDER — MORPHINE SULFATE 30 MG PO TABS: 30.0000 mg | ORAL_TABLET | ORAL | 0 refills | Status: DC | PRN

## 2020-10-18 MED ORDER — HEPARIN SOD (PORK) LOCK FLUSH 100 UNIT/ML IV SOLN
500.0000 [IU] | Freq: Once | INTRAVENOUS | Status: AC | PRN
  Administered 2020-10-18: 500 [IU]

## 2020-10-18 MED ORDER — SODIUM CHLORIDE 0.9 % IV SOLN
10.0000 mg | Freq: Once | INTRAVENOUS | Status: AC
  Administered 2020-10-18: 10 mg via INTRAVENOUS
  Filled 2020-10-18: qty 10

## 2020-10-18 MED ORDER — SODIUM CHLORIDE 0.9 % IV SOLN: Freq: Once | INTRAVENOUS | Status: AC

## 2020-10-18 MED ORDER — SODIUM CHLORIDE 0.9 % IV SOLN
500.0000 mg/m2 | Freq: Once | INTRAVENOUS | Status: AC
  Administered 2020-10-18: 800 mg via INTRAVENOUS
  Filled 2020-10-18: qty 20

## 2020-10-18 MED ORDER — ONDANSETRON HCL 8 MG PO TABS
8.0000 mg | ORAL_TABLET | Freq: Once | ORAL | Status: AC
  Administered 2020-10-18: 8 mg via ORAL
  Filled 2020-10-18: qty 1

## 2020-10-18 MED ORDER — SODIUM CHLORIDE 0.9% FLUSH: 10.0000 mL | INTRAVENOUS | Status: DC | PRN

## 2020-10-18 NOTE — Patient Instructions (Addendum)
[facility]  Discharge Instructions: Thank you for choosing [facility] to provide your oncology and hematology care.   If you have a lab appointment with the [facility], please go directly to the [facility] and check in at the registration area.   Wear comfortable clothing and clothing appropriate for easy access to any Portacath or PICC line.   We strive to give you quality time with your provider. You may need to reschedule your appointment if you arrive late (15 or more minutes).  Arriving late affects you and other patients whose appointments are after yours.  Also, if you miss three or more appointments without notifying the office, you may be dismissed from the clinic at the provider's discretion.      For prescription refill requests, have your pharmacy contact our office and allow 72 hours for refills to be completed.    Today you received the following chemotherapy and/or immunotherapy agents: Alimta      To help prevent nausea and vomiting after your treatment, we encourage you to take your nausea medication as directed.  BELOW ARE SYMPTOMS THAT SHOULD BE REPORTED IMMEDIATELY: *FEVER GREATER THAN 100.4 F (38 C) OR HIGHER *CHILLS OR SWEATING *NAUSEA AND VOMITING THAT IS NOT CONTROLLED WITH YOUR NAUSEA MEDICATION *UNUSUAL SHORTNESS OF BREATH *UNUSUAL BRUISING OR BLEEDING *URINARY PROBLEMS (pain or burning when urinating, or frequent urination) *BOWEL PROBLEMS (unusual diarrhea, constipation, pain near the anus) TENDERNESS IN MOUTH AND THROAT WITH OR WITHOUT PRESENCE OF ULCERS (sore throat, sores in mouth, or a toothache) UNUSUAL RASH, SWELLING OR PAIN  UNUSUAL VAGINAL DISCHARGE OR ITCHING   Items with * indicate a potential emergency and should be followed up as soon as possible or go to the Emergency Department if any problems should occur.  Please show the CHEMOTHERAPY ALERT CARD or IMMUNOTHERAPY ALERT CARD at check-in to the  Emergency Department and triage nurse.  Should you have questions after your visit or need to cancel or reschedule your appointment, please contact [facility]  Dept: [phone]  and follow the prompts.  Office hours are 8:00 a.m. to 4:30 p.m. Monday - Friday. Please note that voicemails left after 4:00 p.m. may not be returned until the following business day.  We are closed weekends and major holidays. You have access to a nurse at all times for urgent questions. Please call the main number to the clinic Dept: [phone] and follow the prompts.   For any non-urgent questions, you may also contact your provider using MyChart. We now offer e-Visits for anyone 28 and older to request care online for non-urgent symptoms. For details visit [email or web address].   Also download the MyChart app! Go to the app store, search "MyChart", open the app, select [facility], and log in with your MyChart username and password.  Due to Covid, a mask is required upon entering the hospital/clinic. If you do not have a mask, one will be given to you upon arrival. For doctor visits, patients may have 1 support person aged 72 or older with them. For treatment visits, patients cannot have anyone with them due to current Covid guidelines and our immunocompromised population.    Influenza (Flu) Vaccine (Inactivated or Recombinant): What You Need to Know 1. Why get vaccinated? Influenza vaccine can prevent influenza (flu). Flu is a contagious disease that spreads around the [place] every year, usually between October and May. Anyone can get the flu, but it is more  dangerous for some people. Infants and young children, people 66 years and older, pregnant people, and people with certain health conditions or a weakened immune system are at greatest risk of flu complications. Pneumonia, bronchitis, sinus infections, and ear infections are examples of flu-related complications. If you  have a medical condition, such as heart disease, cancer, or diabetes, flu can make it worse. Flu can cause fever and chills, sore throat, muscle aches, fatigue, cough, headache, and runny or stuffy nose. Some people may have vomiting and diarrhea, though this is more common in children than adults. In an average year, thousands of people in the [place] die from flu, and many more are hospitalized. Flu vaccine prevents millions of illnesses and flu-related visits to the doctor each year. 2. Influenza vaccines CDC recommends everyone 6 months and older get vaccinated every flu season. Children 6 months through 34 years of age may need 2 doses during a single flu season. Everyone else needs only 1 dose each flu season. It takes about 2 weeks for protection to develop after vaccination. There are many flu viruses, and they are always changing. Each year a new flu vaccine is made to protect against the influenza viruses believed to be likely to cause disease in the upcoming flu season. Even when the vaccine doesn't exactly match these viruses, it may still provide some protection. Influenza vaccine does not cause flu. Influenza vaccine may be given at the same time as other vaccines. 3. Talk with your health care provider Tell your vaccination provider if the person getting the vaccine: Has had an allergic reaction after a previous dose of influenza vaccine, or has any severe, life-threatening allergies Has ever had Guillain-Barr Syndrome (also called "GBS") In some cases, your health care provider may decide to postpone influenza vaccination until a future visit. Influenza vaccine can be administered at any time during pregnancy. People who are or will be pregnant during influenza season should receive inactivated influenza vaccine. People with minor illnesses, such as a cold, may be vaccinated. People who are moderately or severely ill should usually wait until they recover before getting influenza  vaccine. Your health care provider can give you more information. 4. Risks of a vaccine reaction Soreness, redness, and swelling where the shot is given, fever, muscle aches, and headache can happen after influenza vaccination. There may be a very small increased risk of Guillain-Barr Syndrome (GBS) after inactivated influenza vaccine (the flu shot). Young children who get the flu shot along with pneumococcal vaccine (PCV13) and/or DTaP vaccine at the same time might be slightly more likely to have a seizure caused by fever. Tell your health care provider if a child who is getting flu vaccine has ever had a seizure. People sometimes faint after medical procedures, including vaccination. Tell your provider if you feel dizzy or have vision changes or ringing in the ears. As with any medicine, there is a very remote chance of a vaccine causing a severe allergic reaction, other serious injury, or death. 5. What if there is a serious problem? An allergic reaction could occur after the vaccinated person leaves the clinic. If you see signs of a severe allergic reaction (hives, swelling of the face and throat, difficulty breathing, a fast heartbeat, dizziness, or weakness), call 9-1-1 and get the person to the nearest hospital. For other signs that concern you, call your health care provider. Adverse reactions should be reported to the Vaccine Adverse Event Reporting System (VAERS). Your health care provider will usually file  this report, or you can do it yourself. Visit the VAERS website at www.vaers.SamedayNews.es or call 331-425-8595. VAERS is only for reporting reactions, and VAERS staff members do not give medical advice. 6. The National Vaccine Injury Compensation Program The [employer] Vaccine Injury Compensation Program (VICP) is a federal program that was created to compensate people who may have been injured by certain vaccines. Claims regarding alleged injury or death due to vaccination have a time limit  for filing, which may be as short as two years. Visit the VICP website at GoldCloset.com.ee or call 347 176 5968 to learn about the program and about filing a claim. 7. How can I learn more? Ask your health care provider. Call your local or state health department. Visit the website of the Food and Drug Administration (FDA) for vaccine package inserts and additional information at TraderRating.uy. Contact the Centers for Disease Control and Prevention (CDC): Call (703)063-8780 (1-800-CDC-INFO) or Visit CDC's website at https://gibson.com/. Vaccine Information Statement Inactivated Influenza Vaccine (08/13/2019) This information is not intended to replace advice given to you by your health care provider. Make sure you discuss any questions you have with your health care provider. Document Revised: 09/30/2019 Document Reviewed: 09/30/2019 Elsevier Patient Education  2022 [employer].

## 2020-10-18 NOTE — Progress Notes (Signed)
[facility] [phone]   [phone]  OFFICE PROGRESS NOTE  [name], MD [place] [address]  DIAGNOSIS: Metastatic non-small cell lung cancer initially diagnosed as locally advanced stage IIIB with a right Pancoast tumor involving the vertebral body as well as prominent canal invasion with spinal cord compression in October 2007. The patient also has metastatic disease to the brain in April 2008.  PRIOR THERAPY: Status post concurrent chemoradiation with weekly carboplatin and paclitaxel, last dose was given November 18, 2005. 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 Status post gamma knife radiotherapy to a solitary brain lesion located in the superior frontal area of the brain at [facility] in April of 2008. Status post palliative radiotherapy to the lateral abdominal wall metastatic lesion under the care of Dr. Lisbeth Renshaw, completed March of 2009. Status post 6 cycles of systemic chemotherapy with carboplatin and Alimta. Last dose was given July 26, 2007 with disease stabilization. Gamma knife stereotactic radiotherapy to 2 brain lesions one involving the right frontal dural based as well as right parietal lesion performed on 05/07/2012 under the care of Dr. Vallarie Mare at [facility].  CURRENT THERAPY: Maintenance systemic chemotherapy with Alimta 500 MG/M2 every 3 weeks, status post 227 cycles.  INTERVAL HISTORY: Danielle [name] 70 y.o. female returns to the clinic today for follow-up visit accompanied by her daughter.  The patient is feeling fine today with no concerning complaints.  She denied having any chest pain, shortness of breath, cough or hemoptysis.  She continues to have 1 or 2 days of nausea after the chemotherapy.  She has no current nausea, vomiting, diarrhea or constipation.  She has no headache or visual changes.  She has  no weight loss or night sweats.  She is here today for evaluation before starting cycle #228 of her treatment.   MEDICAL HISTORY: Past Medical History:  Diagnosis Date   Anemia    Antineoplastic chemotherapy induced anemia 01/23/2016   Carcinomas, basal cell 01/22/2019   two places on face   CVA (cerebral vascular accident) ([place]) 04/2014   pt states had 2 cva's within 2 wks   Diabetes mellitus without complication ([facility])    DJD (degenerative joint disease), cervical    Fibromyalgia    H/O: pneumonia    History of tobacco abuse quit 10/08   on nicotine patch   Hypertension    Hypokalemia    Lung cancer ([facility]) dx'd 09/2005   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 had chemo and radiation for lung ca   Thrush 12/11/2010    ALLERGIES:  is allergic to contrast media [iodinated diagnostic agents], iohexol, sulfa drugs cross reactors, and co-trimoxazole injection [sulfamethoxazole-trimethoprim].  MEDICATIONS:  Current Outpatient Medications  Medication Sig Dispense Refill   albuterol (VENTOLIN HFA) 108 (90 Base) MCG/ACT inhaler TAKE 2 PUFFS BY MOUTH EVERY 6 HOURS AS NEEDED FOR WHEEZE OR SHORTNESS OF BREATH 18 each 5   alendronate (FOSAMAX) 70 MG tablet Take 1 tablet (70 mg total) by mouth every 7 (seven) days. Take with a full glass of water on an empty stomach. 4 tablet 11   amLODipine (NORVASC) 10 MG tablet TAKE 1 TABLET BY MOUTH EVERY DAY 90 tablet 0   aspirin 325 MG EC tablet TAKE 1 TABLET BY MOUTH EVERY DAY 90 tablet 0   atorvastatin (LIPITOR) 40 MG tablet TAKE 1 TABLET BY MOUTH EVERY DAY  AT 6 PM 90 tablet 3   Cholecalciferol (VITAMIN D3) 50 MCG (2000 UT) TABS Take by mouth.     diphenhydrAMINE (BENADRYL) 25 MG tablet Take 1 tablet (25 mg total) by mouth as directed. 2 hours before CT scan. 5 tablet 0   FeFum-FePoly-FA-B Cmp-C-Biot (INTEGRA PLUS) CAPS TAKE 1 CAPSULE BY MOUTH EVERY DAY IN THE MORNING 90 capsule 3   fluticasone (FLONASE) 50 MCG/ACT nasal spray Place 1-2 sprays into  both nostrils daily as needed. 16 g 5   folic acid (FOLVITE) 1 MG tablet TAKE 1 TABLET BY MOUTH EVERY DAY 90 tablet 1   furosemide (LASIX) 20 MG tablet TAKE 1 TABLET BY MOUTH EVERY DAY AS NEEDED FOR SWELLING 20 tablet 1   INCRUSE ELLIPTA 62.5 MCG/INH AEPB TAKE 1 PUFF BY MOUTH EVERY DAY 30 each 2   lidocaine-prilocaine (EMLA) cream Apply to the Port-A-Cath site 30-60-minute before treatment. 30 g 0   lisinopril (ZESTRIL) 20 MG tablet TAKE 2 TABLETS (40 MG TOTAL) BY MOUTH DAILY. 180 tablet 1   morphine (MS CONTIN) 60 MG 12 hr tablet Take 1 tablet (60 mg total) by mouth 2 (two) times daily. 60 tablet 0   morphine (MSIR) 30 MG tablet Take 1 tablet (30 mg total) by mouth every 4 (four) hours as needed (breakthrough pain). 60 tablet 0   Olopatadine HCl 0.2 % SOLN INSTILL 1 DROP INTO AFFECTED EYE(S) EVERY DAY 2.5 mL 1   ondansetron (ZOFRAN) 8 MG tablet TAKE 1 TABLET BY MOUTH EVERY 8 HOURS AS NEEDED FOR NAUSEA AND VOMITING 20 tablet 0   predniSONE (DELTASONE) 50 MG tablet 1 tablet by mouth 13 hours ,7 hours and 1 hour  before CT scan 3 tablet 4   prochlorperazine (COMPAZINE) 10 MG tablet Take 1 tablet (10 mg total) by mouth every 6 (six) hours as needed. 30 tablet 2   triamcinolone (KENALOG) 0.025 % ointment APPLY TO AFFECTED AREA TWICE A DAY 30 g 0   No current facility-administered medications for this visit.    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   LOOP RECORDER IMPLANT N/A 04/19/2014   Procedure: LOOP RECORDER IMPLANT;  Surgeon: Thompson Grayer, MD;  [facility]: [facility] CATH LAB;  Service: Cardiovascular;  Laterality: N/A;   OTHER SURGICAL HISTORY  2008   Gamma knife surgery to remove brain met    PORTACATH PLACEMENT  -[name]   TIP IN CAVOATRIAL JUNCTION   TEE WITHOUT CARDIOVERSION N/A 04/19/2014   Procedure: TRANSESOPHAGEAL ECHOCARDIOGRAM  (TEE);  Surgeon: Larey Dresser, MD;  Location: [facility];  Service: Cardiovascular;  Laterality: N/A;    REVIEW OF SYSTEMS:  A comprehensive review of systems was negative except for: Gastrointestinal: positive for nausea   PHYSICAL EXAMINATION: General appearance: alert, cooperative, and no distress Head: Normocephalic, without obvious abnormality, atraumatic Neck: no adenopathy, no JVD, supple, symmetrical, trachea midline, and thyroid not enlarged, symmetric, no tenderness/mass/nodules Lymph nodes: Cervical, supraclavicular, and axillary nodes normal. Resp: clear to auscultation bilaterally Back: symmetric, no curvature. ROM normal. No CVA tenderness. Cardio: regular rate and rhythm, S1, S2 normal, no murmur, click, rub or gallop GI: soft, non-tender; bowel sounds normal; no masses,  no organomegaly Extremities: extremities normal, atraumatic, no cyanosis or edema  ECOG PERFORMANCE STATUS: 1 - Symptomatic but completely ambulatory  Blood  pressure (!) 151/62, pulse 90, temperature (!) 97.1 F (36.2 C), temperature source Tympanic, resp. rate 20, height 5' 2.75" (1.594 m), weight 148 lb 14.4 oz (67.5 kg), SpO2 97 %.  LABORATORY DATA: Lab Results  Component Value Date   WBC 8.9 10/18/2020   HGB 10.6 (L) 10/18/2020   HCT 32.5 (L) 10/18/2020   MCV 97.3 10/18/2020   PLT 383 10/18/2020      Chemistry      Component Value Date/Time   NA 142 09/27/2020 1125   NA 142 12/24/2016 0816   K 4.2 09/27/2020 1125   K 4.4 12/24/2016 0816   CL 106 09/27/2020 1125   CL 106 06/30/2012 1004   CO2 25 09/27/2020 1125   CO2 28 12/24/2016 0816   BUN 13 09/27/2020 1125   BUN 15.8 12/24/2016 0816   CREATININE 0.86 09/27/2020 1125   CREATININE 0.82 05/02/2020 1036   CREATININE 1.1 12/24/2016 0816      Component Value Date/Time   CALCIUM 9.8 09/27/2020 1125   CALCIUM 9.6 12/24/2016 0816   ALKPHOS 73 09/27/2020 1125   ALKPHOS 80 12/24/2016 0816   AST 21 09/27/2020 1125   AST 24  05/02/2020 1036   AST 20 12/24/2016 0816   ALT 11 09/27/2020 1125   ALT 10 05/02/2020 1036   ALT 11 12/24/2016 0816   BILITOT 0.4 09/27/2020 1125   BILITOT 0.4 05/02/2020 1036   BILITOT 0.34 12/24/2016 0816       RADIOGRAPHIC STUDIES: No results found.  ASSESSMENT AND PLAN: This is a very pleasant 70 years old white female with metastatic non-small cell lung cancer diagnosed in October 2007 status post concurrent chemoradiation followed by consolidation chemotherapy and she is currently on maintenance treatment with single agent Alimta status post 227 cycles. The patient has been tolerating her treatment well with no concerning adverse effect except for 1 or 2 days of nausea after the chemotherapy. I recommended for her to proceed with cycle #228 today as planned. I will see her back for follow-up visit in 3 weeks for evaluation with repeat CT scan of the chest for restaging of her disease. The patient was advised to call immediately if she has any other concerning symptoms in the interval. The patient voices understanding of current disease status and treatment options and is in agreement with the current care plan. All questions were answered. The patient knows to call the clinic with any problems, questions or concerns. We can certainly see the patient much sooner if necessary.  Disclaimer: This note was dictated with voice recognition software. Similar sounding words can inadvertently be transcribed and may not be corrected upon review.

## 2020-10-30 ENCOUNTER — Telehealth: Payer: Self-pay

## 2020-10-30 DIAGNOSIS — Z91041 Radiographic dye allergy status: Secondary | ICD-10-CM

## 2020-10-30 DIAGNOSIS — Z85118 Personal history of other malignant neoplasm of bronchus and lung: Secondary | ICD-10-CM

## 2020-10-30 DIAGNOSIS — C3490 Malignant neoplasm of unspecified part of unspecified bronchus or lung: Secondary | ICD-10-CM

## 2020-10-30 MED ORDER — PREDNISONE 50 MG PO TABS: ORAL_TABLET | ORAL | 4 refills | Status: DC

## 2020-10-30 NOTE — Chronic Care Management (AMB) (Addendum)
Chronic Care Management Pharmacy Assistant   Name: Danielle [name]  MRN: [id number] DOB: 1950/09/30  Reason for Encounter: General Disease State  Recent office visits:  None since last CCM contact  Recent consult visits:  10/18/20-Oncology-Patient presented for follow up lung cancer.continue treatment,repeat CT-scan,no medication changes 09/27/20-Oncology-Patient presented for treatment lung cancer,labs reviewed,follow up 3 weeks.  Hospital visits:  None in previous 6 months  Medications: Outpatient Encounter Medications as of 10/30/2020  Medication Sig   albuterol (VENTOLIN HFA) 108 (90 Base) MCG/ACT inhaler TAKE 2 PUFFS BY MOUTH EVERY 6 HOURS AS NEEDED FOR WHEEZE OR SHORTNESS OF BREATH   alendronate (FOSAMAX) 70 MG tablet TAKE 1 TABLET BY MOUTH EVERY 7 (SEVEN) DAYS. TAKE WITH A FULL GLASS OF WATER ON AN EMPTY STOMACH.   amLODipine (NORVASC) 10 MG tablet TAKE 1 TABLET BY MOUTH EVERY DAY   aspirin 325 MG EC tablet TAKE 1 TABLET BY MOUTH EVERY DAY   atorvastatin (LIPITOR) 40 MG tablet TAKE 1 TABLET BY MOUTH EVERY DAY AT 6 PM   Cholecalciferol (VITAMIN D3) 50 MCG (2000 UT) TABS Take by mouth.   diphenhydrAMINE (BENADRYL) 25 MG tablet Take 1 tablet (25 mg total) by mouth as directed. 2 hours before CT scan.   FeFum-FePoly-FA-B Cmp-C-Biot (INTEGRA PLUS) CAPS TAKE 1 CAPSULE BY MOUTH EVERY DAY IN THE MORNING   fluticasone (FLONASE) 50 MCG/ACT nasal spray Place 1-2 sprays into both nostrils daily as needed.   folic acid (FOLVITE) 1 MG tablet TAKE 1 TABLET BY MOUTH EVERY DAY   furosemide (LASIX) 20 MG tablet TAKE 1 TABLET BY MOUTH EVERY DAY AS NEEDED FOR SWELLING   INCRUSE ELLIPTA 62.5 MCG/INH AEPB TAKE 1 PUFF BY MOUTH EVERY DAY   lidocaine-prilocaine (EMLA) cream Apply to the Port-A-Cath site 30-60-minute before treatment.   lisinopril (ZESTRIL) 20 MG tablet TAKE 2 TABLETS (40 MG TOTAL) BY MOUTH DAILY.   morphine (MS CONTIN) 60 MG 12 hr tablet Take 1 tablet (60 mg total) by mouth 2  (two) times daily.   morphine (MSIR) 30 MG tablet Take 1 tablet (30 mg total) by mouth every 4 (four) hours as needed (breakthrough pain).   Olopatadine HCl 0.2 % SOLN INSTILL 1 DROP INTO AFFECTED EYE(S) EVERY DAY   ondansetron (ZOFRAN) 8 MG tablet TAKE 1 TABLET BY MOUTH EVERY 8 HOURS AS NEEDED FOR NAUSEA AND VOMITING   predniSONE (DELTASONE) 50 MG tablet 1 tablet by mouth 13 hours ,7 hours and 1 hour  before CT scan   prochlorperazine (COMPAZINE) 10 MG tablet Take 1 tablet (10 mg total) by mouth every 6 (six) hours as needed.   triamcinolone (KENALOG) 0.025 % ointment APPLY TO AFFECTED AREA TWICE A DAY   No facility-administered encounter medications on file as of 10/30/2020.    Attempted contact with Danielle [name] 3 times on 10/30/20,10/31/20,11/01/20. Unsuccessful outreach. Will attempt contact next month.   Patient is not > 5 days past due for refill on the following medications per chart history:  [facility] Medications: Medication Name/mg Last Fill Days Supply Lisinopril 20mg                        10/17/20            90 Atorvastatin 40mg                    09/21/20             90  Care Gaps: Annual wellness visit in  last year? Yes Most Recent BP reading: 151/62  90-P  Upcoming appts: multiple infusion appointments with oncology  Debbora Dus, CPP notified  Avel Sensor, [place] [facility] Assistant [phone]

## 2020-10-30 NOTE — Telephone Encounter (Signed)
Pt called requesting a refill of Morphine 60mg  and prednisone 50mg  for pretreatment for her scan coming up on Friday.

## 2020-11-01 ENCOUNTER — Telehealth: Payer: Self-pay | Admitting: Medical Oncology

## 2020-11-01 ENCOUNTER — Other Ambulatory Visit: Payer: Self-pay | Admitting: Internal Medicine

## 2020-11-01 DIAGNOSIS — C3410 Malignant neoplasm of upper lobe, unspecified bronchus or lung: Secondary | ICD-10-CM

## 2020-11-01 DIAGNOSIS — C3490 Malignant neoplasm of unspecified part of unspecified bronchus or lung: Secondary | ICD-10-CM

## 2020-11-01 MED ORDER — MORPHINE SULFATE ER 60 MG PO TBCR: 60.0000 mg | EXTENDED_RELEASE_TABLET | Freq: Two times a day (BID) | ORAL | 0 refills | Status: DC

## 2020-11-01 NOTE — Telephone Encounter (Signed)
Requests refill for MSC 60 mg.

## 2020-11-03 ENCOUNTER — Other Ambulatory Visit: Payer: Self-pay

## 2020-11-03 ENCOUNTER — Encounter (HOSPITAL_COMMUNITY): Payer: Self-pay

## 2020-11-03 ENCOUNTER — Ambulatory Visit (HOSPITAL_COMMUNITY)
Admission: RE | Admit: 2020-11-03 | Discharge: 2020-11-03 | Disposition: A | Discharge status: Home / Self Care | Payer: Medicare Other | Source: Ambulatory Visit | Attending: Internal Medicine | Admitting: Internal Medicine

## 2020-11-03 DIAGNOSIS — I700 Atherosclerosis of aorta: Secondary | ICD-10-CM | POA: Diagnosis not present

## 2020-11-03 DIAGNOSIS — C3490 Malignant neoplasm of unspecified part of unspecified bronchus or lung: Secondary | ICD-10-CM | POA: Diagnosis not present

## 2020-11-03 DIAGNOSIS — J439 Emphysema, unspecified: Secondary | ICD-10-CM | POA: Diagnosis not present

## 2020-11-03 MED ORDER — HEPARIN SOD (PORK) LOCK FLUSH 100 UNIT/ML IV SOLN
500.0000 [IU] | Freq: Once | INTRAVENOUS | Status: AC
  Administered 2020-11-03: 500 [IU] via INTRAVENOUS

## 2020-11-03 MED ORDER — HEPARIN SOD (PORK) LOCK FLUSH 100 UNIT/ML IV SOLN
INTRAVENOUS | Status: AC
  Filled 2020-11-03: qty 5

## 2020-11-03 MED ORDER — IOHEXOL 350 MG/ML SOLN
75.0000 mL | Freq: Once | INTRAVENOUS | Status: AC | PRN
  Administered 2020-11-03: 75 mL via INTRAVENOUS

## 2020-11-03 NOTE — Progress Notes (Signed)
[place] OFFICE PROGRESS NOTE  [name], MD [place] [id number]  DIAGNOSIS: Metastatic non-small cell lung cancer initially diagnosed as locally advanced stage IIIB with a right Pancoast tumor involving the vertebral body as well as prominent canal invasion with spinal cord compression in October 2007. The patient also has metastatic disease to the brain in April 2008.  PRIOR THERAPY: Status post concurrent chemoradiation with weekly carboplatin and paclitaxel, last dose was given November 18, 2005. 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 Status post gamma knife radiotherapy to a solitary brain lesion located in the superior frontal area of the brain at [facility] in April of 2008. Status post palliative radiotherapy to the lateral abdominal wall metastatic lesion under the care of Dr. Lisbeth Renshaw, completed March of 2009. Status post 6 cycles of systemic chemotherapy with carboplatin and Alimta. Last dose was given July 26, 2007 with disease stabilization. Gamma knife stereotactic radiotherapy to 2 brain lesions one involving the right frontal dural based as well as right parietal lesion performed on 05/07/2012 under the care of Dr. Vallarie Mare at [facility].  CURRENT THERAPY: Maintenance systemic chemotherapy with Alimta 500 MG/M2 every 3 weeks, status post 228 cycles.  INTERVAL HISTORY: Danielle [name] 70 y.o. female returns to the clinic today for a follow up visit. She is feeling well today without any concerning complaints but she was sick with a stomach bug for one day over the weekend. She had nausea, vomiting, and diarrhea. Her symptoms resolved on Saturday. She is requesting a refill of her zofran. She is tolerating her treatment with maintenance Alimta well without any adverse side effects except for occasional nausea/vomiting. She takes zofran for nausea  and compazine if needed. She denies any fever. She had chills one day last week the day before she got sick. She reportedly has baseline night sweats for several years, which are unchanged. She denies any hemoptysis, cough, chest pain.  She reports her baseline shortness of breath with exertion.  She denies any diarrhea or constipation.  She denies any headaches or visual changes. She recently had a restaging CT scan. She is here today for evaluation and to review the scan before starting cycle #229.     MEDICAL HISTORY: Past Medical History:  Diagnosis Date   Anemia    Antineoplastic chemotherapy induced anemia 01/23/2016   Carcinomas, basal cell 01/22/2019   two places on face   CVA (cerebral vascular accident) ([place]) 04/2014   pt states had 2 cva's within 2 wks   Diabetes mellitus without complication ([place])    DJD (degenerative joint disease), cervical    Fibromyalgia    H/O: pneumonia    History of tobacco abuse quit 10/08   on nicotine patch   Hypertension    Hypokalemia    Lung cancer ([place]) dx'd 09/2005   hx of non-small cell: metastasis to brain. had chemo and radiation for lung ca   Thrush 12/11/2010    ALLERGIES:  is allergic to contrast media [iodinated diagnostic agents], iohexol, sulfa drugs cross reactors, and co-trimoxazole injection [sulfamethoxazole-trimethoprim].  MEDICATIONS:  Current Outpatient Medications  Medication Sig Dispense Refill   albuterol (VENTOLIN HFA) 108 (90 Base) MCG/ACT inhaler TAKE 2 PUFFS BY MOUTH EVERY 6 HOURS AS NEEDED FOR WHEEZE OR SHORTNESS OF BREATH 18 each 5   alendronate (FOSAMAX) 70 MG tablet TAKE 1 TABLET BY MOUTH EVERY 7 (SEVEN) DAYS. TAKE WITH A  FULL GLASS OF WATER ON AN EMPTY STOMACH. 12 tablet 1   amLODipine (NORVASC) 10 MG tablet TAKE 1 TABLET BY MOUTH EVERY DAY 90 tablet 0   aspirin 325 MG EC tablet TAKE 1 TABLET BY MOUTH EVERY DAY 90 tablet 0   atorvastatin (LIPITOR) 40 MG tablet TAKE 1 TABLET BY MOUTH EVERY DAY AT 6 PM 90 tablet 3    Cholecalciferol (VITAMIN D3) 50 MCG (2000 UT) TABS Take by mouth.     diphenhydrAMINE (BENADRYL) 25 MG tablet Take 1 tablet (25 mg total) by mouth as directed. 2 hours before CT scan. 5 tablet 0   FeFum-FePoly-FA-B Cmp-C-Biot (INTEGRA PLUS) CAPS TAKE 1 CAPSULE BY MOUTH EVERY DAY IN THE MORNING 90 capsule 3   fluticasone (FLONASE) 50 MCG/ACT nasal spray Place 1-2 sprays into both nostrils daily as needed. 16 g 5   folic acid (FOLVITE) 1 MG tablet TAKE 1 TABLET BY MOUTH EVERY DAY 90 tablet 1   furosemide (LASIX) 20 MG tablet TAKE 1 TABLET BY MOUTH EVERY DAY AS NEEDED FOR SWELLING 20 tablet 1   INCRUSE ELLIPTA 62.5 MCG/INH AEPB TAKE 1 PUFF BY MOUTH EVERY DAY 30 each 2   lidocaine-prilocaine (EMLA) cream Apply to the Port-A-Cath site 30-60-minute before treatment. 30 g 0   lisinopril (ZESTRIL) 20 MG tablet TAKE 2 TABLETS (40 MG TOTAL) BY MOUTH DAILY. 180 tablet 1   morphine (MS CONTIN) 60 MG 12 hr tablet Take 1 tablet (60 mg total) by mouth 2 (two) times daily. 60 tablet 0   morphine (MSIR) 30 MG tablet Take 1 tablet (30 mg total) by mouth every 4 (four) hours as needed (breakthrough pain). 60 tablet 0   Olopatadine HCl 0.2 % SOLN INSTILL 1 DROP INTO AFFECTED EYE(S) EVERY DAY 2.5 mL 1   ondansetron (ZOFRAN) 8 MG tablet TAKE 1 TABLET BY MOUTH EVERY 8 HOURS AS NEEDED FOR NAUSEA AND VOMITING 20 tablet 1   predniSONE (DELTASONE) 50 MG tablet 1 tablet by mouth 13 hours ,7 hours and 1 hour  before CT scan 3 tablet 4   prochlorperazine (COMPAZINE) 10 MG tablet Take 1 tablet (10 mg total) by mouth every 6 (six) hours as needed. 30 tablet 2   triamcinolone (KENALOG) 0.025 % ointment APPLY TO AFFECTED AREA TWICE A DAY 30 g 0   No current facility-administered medications for this visit.    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   LOOP RECORDER  IMPLANT N/A 04/19/2014   Procedure: LOOP RECORDER IMPLANT;  Surgeon: Thompson Grayer, MD;  Location: [facility] CATH LAB;  Service: Cardiovascular;  Laterality: N/A;   OTHER SURGICAL HISTORY  2008   Gamma knife surgery to remove brain met    PORTACATH PLACEMENT  -[name]   TIP IN CAVOATRIAL JUNCTION   TEE WITHOUT CARDIOVERSION N/A 04/19/2014   Procedure: TRANSESOPHAGEAL ECHOCARDIOGRAM (TEE);  Surgeon: Larey Dresser, MD;  Location: [place];  Service: Cardiovascular;  Laterality: N/A;    REVIEW OF SYSTEMS:   Review of Systems  Constitutional: Negative for appetite change, chills, fatigue, fever and unexpected weight change. HENT: Negative for mouth sores, nosebleeds, sore throat and trouble swallowing.   Eyes: Negative for eye problems and icterus. Respiratory: Positive for baseline shortness of breath with exertion. Negative for cough, hemoptysis, and wheezing.  Cardiovascular: Positive for bilateral lower extremity swelling (improved from prior). Negative for chest pain. Gastrointestinal: Positive for occasional nausea and vomiting (had nausea/vomiting last week). Positive for diarrhea for 1 day. Negative for abdominal pain and constipation. Genitourinary:  Negative for bladder incontinence, difficulty urinating, dysuria, frequency and hematuria.   Musculoskeletal: Negative for back pain, gait problem, neck pain and neck stiffness. Skin: Positive for several scars on her skin. Negative for itching and rash. Neurological: Negative for dizziness, extremity weakness, gait problem, headaches, light-headedness and seizures. Hematological: Negative for adenopathy. Does not bruise/bleed easily. Psychiatric/Behavioral: Negative for confusion, depression and sleep disturbance. The patient is not nervous/anxious.     PHYSICAL EXAMINATION:  Blood pressure (!) 145/67, pulse 87, temperature (!) 97.4 F (36.3 C), temperature source Tympanic, resp. rate 19, height 5' 2.75" (1.594 m), weight 143 lb 12.8 oz  (65.2 kg), SpO2 100 %.  ECOG PERFORMANCE STATUS: 1  Physical Exam  Constitutional: Oriented to person, place, and time and well-developed, well-nourished, and in no distress.   HENT: Head: Normocephalic and atraumatic. Mouth/Throat: Oropharynx is clear and moist. No oropharyngeal exudate. Eyes: Conjunctivae are normal. Right eye exhibits no discharge. Left eye exhibits no discharge. No scleral icterus. Neck: Normal range of motion. Neck supple. Cardiovascular: Normal rate, regular rhythm, normal heart sounds and intact distal pulses.   Pulmonary/Chest: Effort normal. Some bilateral wheezing. No respiratory distress. No rales. Abdominal: Soft. Bowel sounds are normal. Exhibits no distension and no mass. There is no tenderness.  Musculoskeletal: Positive for bilateral lower extremity edema. Normal range of motion.  Lymphadenopathy:    No cervical adenopathy.  Neurological: Alert and oriented to person, place, and time. Exhibits normal muscle tone. Gait normal. Coordination normal. Ambulates with a cane. Skin: Skin is warm and dry. No rash noted. Not diaphoretic. No erythema. No pallor.  Psychiatric: Mood, memory and judgment normal. Vitals reviewed.  LABORATORY DATA: Lab Results  Component Value Date   WBC 11.9 (H) 11/08/2020   HGB 11.9 (L) 11/08/2020   HCT 35.8 (L) 11/08/2020   MCV 94.5 11/08/2020   PLT 406 (H) 11/08/2020      Chemistry      Component Value Date/Time   NA 137 11/08/2020 1052   NA 142 12/24/2016 0816   K 3.5 11/08/2020 1052   K 4.4 12/24/2016 0816   CL 99 11/08/2020 1052   CL 106 06/30/2012 1004   CO2 27 11/08/2020 1052   CO2 28 12/24/2016 0816   BUN 30 (H) 11/08/2020 1052   BUN 15.8 12/24/2016 0816   CREATININE 1.19 (H) 11/08/2020 1052   CREATININE 0.97 10/18/2020 1323   CREATININE 1.1 12/24/2016 0816      Component Value Date/Time   CALCIUM 8.9 11/08/2020 1052   CALCIUM 9.6 12/24/2016 0816   ALKPHOS 68 11/08/2020 1052   ALKPHOS 80 12/24/2016 0816    AST 20 11/08/2020 1052   AST 21 10/18/2020 1323   AST 20 12/24/2016 0816   ALT 16 11/08/2020 1052   ALT 13 10/18/2020 1323   ALT 11 12/24/2016 0816   BILITOT 0.5 11/08/2020 1052   BILITOT 0.3 10/18/2020 1323   BILITOT 0.34 12/24/2016 0816       RADIOGRAPHIC STUDIES:  CT Chest W Contrast  Result Date: 11/05/2020 CLINICAL DATA:  70 year old female with history of non-small cell lung cancer with metastatic disease to the spine, brain and omentum. Follow-up study. EXAM: CT CHEST, ABDOMEN, AND PELVIS WITH CONTRAST TECHNIQUE: Multidetector CT imaging of the chest, abdomen and pelvis was performed following  the standard protocol during bolus administration of intravenous contrast. CONTRAST:  11m OMNIPAQUE IOHEXOL 350 MG/ML SOLN COMPARISON:  CT of the chest, abdomen and pelvis 04/27/2020. FINDINGS: CT CHEST FINDINGS Cardiovascular: Heart size is normal. There is no significant pericardial fluid, thickening or pericardial calcification. There is aortic atherosclerosis, as well as atherosclerosis of the great vessels of the mediastinum and the coronary arteries, including calcified atherosclerotic plaque in the left main, left anterior descending, left circumflex and right coronary arteries. Right internal jugular single-lumen porta cath with tip terminating in the right atrium. Mediastinum/Nodes: No pathologically enlarged mediastinal or hilar lymph nodes. Esophagus is unremarkable in appearance. No axillary lymphadenopathy. Lungs/Pleura: As with prior examinations there is some mild chronic postradiation fibrosis in the medial aspect of the right upper lobe, stable over several prior examinations. Mild chronic scarring is also noted in the right lower lobe at the lung base. Diffuse bronchial wall thickening with moderate centrilobular and paraseptal emphysema. No suspicious appearing pulmonary nodules or masses are noted. No acute consolidative airspace disease. No pleural effusions. Stable mild right  pleural thickening. Musculoskeletal: Chronic compression fracture of T3 with vertebral plana appearance. Status post vertebroplasty at T4 and T7. There are no aggressive appearing lytic or blastic lesions noted in the visualized portions of the skeleton. CT ABDOMEN PELVIS FINDINGS Hepatobiliary: No suspicious cystic or solid hepatic lesions. No intra or extrahepatic biliary ductal dilatation. Gallbladder is normal in appearance. Pancreas: No pancreatic mass. No pancreatic ductal dilatation. No pancreatic or peripancreatic fluid collections or inflammatory changes. Spleen: Unremarkable. Adrenals/Urinary Tract: Subcentimeter low-attenuation lesions in both kidneys, too small to characterize, but statistically likely to represent tiny cysts. No hydroureteronephrosis. Urinary bladder is normal in appearance. Bilateral adrenal glands are normal in appearance. Stomach/Bowel: The appearance of the stomach is normal. There is no pathologic dilatation of small bowel or colon. Vascular/Lymphatic: Aortic atherosclerosis, without evidence of aneurysm or dissection in the abdominal or pelvic vasculature. No lymphadenopathy noted in the abdomen or pelvis. Reproductive: Uterus and ovaries are atrophic, but otherwise unremarkable in appearance. Other: No significant volume of ascites.  No pneumoperitoneum. Musculoskeletal: Areas of sclerosis are again noted in the femoral heads bilaterally, suggesting regions of avascular necrosis. No collapse of the articular surface is identified at this time. There are no aggressive appearing lytic or blastic lesions noted in the visualized portions of the skeleton. IMPRESSION: 1. Stable examination demonstrating no evidence to suggest local recurrence of disease or definite metastatic disease in the chest, abdomen or pelvis. 2. Diffuse bronchial wall thickening with moderate centrilobular and paraseptal emphysema; imaging findings suggestive of underlying COPD. 3. Aortic atherosclerosis, in  addition to left main and 3 vessel coronary artery disease. Please note that although the presence of coronary artery calcium documents the presence of coronary artery disease, the severity of this disease and any potential stenosis cannot be assessed on this non-gated CT examination. Assessment for potential risk factor modification, dietary therapy or pharmacologic therapy may be warranted, if clinically indicated. 4. Additional incidental findings, as above. Electronically Signed   By: DVinnie LangtonM.D.   On: 11/05/2020 06:50   CT Abdomen Pelvis W Contrast  Result Date: 11/05/2020 CLINICAL DATA:  70year old female with history of non-small cell lung cancer with metastatic disease to the spine, brain and omentum. Follow-up study. EXAM: CT CHEST, ABDOMEN, AND PELVIS WITH CONTRAST TECHNIQUE: Multidetector CT imaging of the chest, abdomen and pelvis was performed following the standard protocol during bolus administration of intravenous contrast. CONTRAST:  766mOMNIPAQUE IOHEXOL 350 MG/ML  SOLN COMPARISON:  CT of the chest, abdomen and pelvis 04/27/2020. FINDINGS: CT CHEST FINDINGS Cardiovascular: Heart size is normal. There is no significant pericardial fluid, thickening or pericardial calcification. There is aortic atherosclerosis, as well as atherosclerosis of the great vessels of the mediastinum and the coronary arteries, including calcified atherosclerotic plaque in the left main, left anterior descending, left circumflex and right coronary arteries. Right internal jugular single-lumen porta cath with tip terminating in the right atrium. Mediastinum/Nodes: No pathologically enlarged mediastinal or hilar lymph nodes. Esophagus is unremarkable in appearance. No axillary lymphadenopathy. Lungs/Pleura: As with prior examinations there is some mild chronic postradiation fibrosis in the medial aspect of the right upper lobe, stable over several prior examinations. Mild chronic scarring is also noted in the  right lower lobe at the lung base. Diffuse bronchial wall thickening with moderate centrilobular and paraseptal emphysema. No suspicious appearing pulmonary nodules or masses are noted. No acute consolidative airspace disease. No pleural effusions. Stable mild right pleural thickening. Musculoskeletal: Chronic compression fracture of T3 with vertebral plana appearance. Status post vertebroplasty at T4 and T7. There are no aggressive appearing lytic or blastic lesions noted in the visualized portions of the skeleton. CT ABDOMEN PELVIS FINDINGS Hepatobiliary: No suspicious cystic or solid hepatic lesions. No intra or extrahepatic biliary ductal dilatation. Gallbladder is normal in appearance. Pancreas: No pancreatic mass. No pancreatic ductal dilatation. No pancreatic or peripancreatic fluid collections or inflammatory changes. Spleen: Unremarkable. Adrenals/Urinary Tract: Subcentimeter low-attenuation lesions in both kidneys, too small to characterize, but statistically likely to represent tiny cysts. No hydroureteronephrosis. Urinary bladder is normal in appearance. Bilateral adrenal glands are normal in appearance. Stomach/Bowel: The appearance of the stomach is normal. There is no pathologic dilatation of small bowel or colon. Vascular/Lymphatic: Aortic atherosclerosis, without evidence of aneurysm or dissection in the abdominal or pelvic vasculature. No lymphadenopathy noted in the abdomen or pelvis. Reproductive: Uterus and ovaries are atrophic, but otherwise unremarkable in appearance. Other: No significant volume of ascites.  No pneumoperitoneum. Musculoskeletal: Areas of sclerosis are again noted in the femoral heads bilaterally, suggesting regions of avascular necrosis. No collapse of the articular surface is identified at this time. There are no aggressive appearing lytic or blastic lesions noted in the visualized portions of the skeleton. IMPRESSION: 1. Stable examination demonstrating no evidence to  suggest local recurrence of disease or definite metastatic disease in the chest, abdomen or pelvis. 2. Diffuse bronchial wall thickening with moderate centrilobular and paraseptal emphysema; imaging findings suggestive of underlying COPD. 3. Aortic atherosclerosis, in addition to left main and 3 vessel coronary artery disease. Please note that although the presence of coronary artery calcium documents the presence of coronary artery disease, the severity of this disease and any potential stenosis cannot be assessed on this non-gated CT examination. Assessment for potential risk factor modification, dietary therapy or pharmacologic therapy may be warranted, if clinically indicated. 4. Additional incidental findings, as above. Electronically Signed   By: Vinnie Langton M.D.   On: 11/05/2020 06:50     ASSESSMENT/PLAN:  This is a very pleasant 70 year old Caucasian female with metastatic non-small cell lung cancer who was initially diagnosed in October 2007.  She is status post current chemoradiation followed by consolidation chemotherapy.   She is currently undergoing weekly maintainance chemotherapy with single agent Alimta 500 mg/m.  She is status post 228 cycles. She is tolerating treatment well without any concerning complaints except mild nausea/vomiting.  She recently had a restaging CT scan performed. Dr. Julien Nordmann personally and independent reviewed  the scan prior to the patient's appointment today. We we reviewed the results which showed no evidence for disease progression.    Labs were reviewed. She will proceed cycle #229 today as scheduled.   Her creatinine is a little elevated compared to her baseline. She had N/V/D over the weekend which resolved. She feels at her baseline to proceed with treatment as planned. Offered IVF but she would rather just increase her oral hydration at home.   I have refilled her zofran.   I have filled out a renewal form for her handicap placard.  We will also  work on getting the patient a walker with a chair.  She mentioned that sometimes when she is at the store her legs will feel weak and she needs to sit down.    We will see her for a follow up visit in 3 weeks for evaluation before starting cycle #230.   The patient was advised to call immediately if she has any concerning symptoms in the interval. The patient voices understanding of current disease status and treatment options and is in agreement with the current care plan. All questions were answered. The patient knows to call the clinic with any problems, questions or concerns. We can certainly see the patient much sooner if necessary     No orders of the defined types were placed in this encounter.     Marla Pouliot L Ossie Beltran, PA-C 11/08/20  ADDENDUM: Hematology/Oncology Attending: I had a face-to-face encounter with the patient today.  I reviewed her record, lab and scan and recommended her care plan.  This is a very pleasant 70 years old white female with metastatic non-small cell lung cancer diagnosed in October 2007 status post several chemotherapy regimens and radiation and she has been on maintenance treatment with single agent Alimta status post 228 cycles. The patient has been tolerating this treatment well except for intermittent nausea treated with Zofran. She also has chronic pain issues and she has been on treatment with [facility] for her pain management. She had repeat CT scan of the chest, abdomen pelvis performed recently.  I personally and independently reviewed the scan and discussed the results with the patient and her daughter. Her scan showed no concerning findings for disease progression. I recommended for her to continue her current treatment and she will proceed with cycle #229 today. The patient will come back for follow-up visit in 3 weeks for evaluation before the next cycle of her treatment. She was advised to call immediately if she has any other  concerning symptoms in the interval. The total time spent in the appointment was 30 minutes. Disclaimer: This note was dictated with voice recognition software. Similar sounding words can inadvertently be transcribed and may be missed upon review. Eilleen Kempf, MD 11/10/20

## 2020-11-07 MED FILL — Dexamethasone Sodium Phosphate Inj 100 MG/10ML: INTRAMUSCULAR | Qty: 1 | Status: AC

## 2020-11-08 ENCOUNTER — Inpatient Hospital Stay (HOSPITAL_BASED_OUTPATIENT_CLINIC_OR_DEPARTMENT_OTHER): Payer: Medicare Other | Admitting: Physician Assistant

## 2020-11-08 ENCOUNTER — Other Ambulatory Visit: Payer: Self-pay

## 2020-11-08 ENCOUNTER — Inpatient Hospital Stay: Payer: Medicare Other

## 2020-11-08 ENCOUNTER — Inpatient Hospital Stay: Payer: Medicare Other | Attending: Physician Assistant

## 2020-11-08 VITALS — BP 145/67 | HR 87 | Temp 97.4°F | Resp 19 | Ht 62.75 in | Wt 143.8 lb

## 2020-11-08 DIAGNOSIS — C7951 Secondary malignant neoplasm of bone: Secondary | ICD-10-CM | POA: Diagnosis not present

## 2020-11-08 DIAGNOSIS — Z8673 Personal history of transient ischemic attack (TIA), and cerebral infarction without residual deficits: Secondary | ICD-10-CM | POA: Insufficient documentation

## 2020-11-08 DIAGNOSIS — I10 Essential (primary) hypertension: Secondary | ICD-10-CM | POA: Insufficient documentation

## 2020-11-08 DIAGNOSIS — Z5111 Encounter for antineoplastic chemotherapy: Secondary | ICD-10-CM | POA: Diagnosis not present

## 2020-11-08 DIAGNOSIS — E119 Type 2 diabetes mellitus without complications: Secondary | ICD-10-CM | POA: Diagnosis not present

## 2020-11-08 DIAGNOSIS — C3411 Malignant neoplasm of upper lobe, right bronchus or lung: Secondary | ICD-10-CM

## 2020-11-08 DIAGNOSIS — G8929 Other chronic pain: Secondary | ICD-10-CM | POA: Diagnosis not present

## 2020-11-08 DIAGNOSIS — Z79891 Long term (current) use of opiate analgesic: Secondary | ICD-10-CM | POA: Insufficient documentation

## 2020-11-08 DIAGNOSIS — C3410 Malignant neoplasm of upper lobe, unspecified bronchus or lung: Secondary | ICD-10-CM | POA: Insufficient documentation

## 2020-11-08 DIAGNOSIS — C3490 Malignant neoplasm of unspecified part of unspecified bronchus or lung: Secondary | ICD-10-CM

## 2020-11-08 DIAGNOSIS — C786 Secondary malignant neoplasm of retroperitoneum and peritoneum: Secondary | ICD-10-CM | POA: Diagnosis not present

## 2020-11-08 DIAGNOSIS — Z923 Personal history of irradiation: Secondary | ICD-10-CM | POA: Diagnosis not present

## 2020-11-08 DIAGNOSIS — Z95828 Presence of other vascular implants and grafts: Secondary | ICD-10-CM

## 2020-11-08 DIAGNOSIS — M47812 Spondylosis without myelopathy or radiculopathy, cervical region: Secondary | ICD-10-CM | POA: Diagnosis not present

## 2020-11-08 DIAGNOSIS — C7931 Secondary malignant neoplasm of brain: Secondary | ICD-10-CM | POA: Diagnosis not present

## 2020-11-08 LAB — CBC WITH DIFFERENTIAL/PLATELET
Abs Immature Granulocytes: 0.05 10*3/uL (ref 0.00–0.07)
Basophils Absolute: 0 10*3/uL (ref 0.0–0.1)
Basophils Relative: 0 %
Eosinophils Absolute: 0 10*3/uL (ref 0.0–0.5)
Eosinophils Relative: 0 %
HCT: 35.8 % — ABNORMAL LOW (ref 36.0–46.0)
Hemoglobin: 11.9 g/dL — ABNORMAL LOW (ref 12.0–15.0)
Immature Granulocytes: 0 %
Lymphocytes Relative: 19 %
Lymphs Abs: 2.3 10*3/uL (ref 0.7–4.0)
MCH: 31.4 pg (ref 26.0–34.0)
MCHC: 33.2 g/dL (ref 30.0–36.0)
MCV: 94.5 fL (ref 80.0–100.0)
Monocytes Absolute: 1.4 10*3/uL — ABNORMAL HIGH (ref 0.1–1.0)
Monocytes Relative: 12 %
Neutro Abs: 8.1 10*3/uL — ABNORMAL HIGH (ref 1.7–7.7)
Neutrophils Relative %: 69 %
Platelets: 406 10*3/uL — ABNORMAL HIGH (ref 150–400)
RBC: 3.79 MIL/uL — ABNORMAL LOW (ref 3.87–5.11)
RDW: 14.2 % (ref 11.5–15.5)
WBC: 11.9 10*3/uL — ABNORMAL HIGH (ref 4.0–10.5)
nRBC: 0 % (ref 0.0–0.2)

## 2020-11-08 LAB — COMPREHENSIVE METABOLIC PANEL
ALT: 16 U/L (ref 0–44)
AST: 20 U/L (ref 15–41)
Albumin: 3.4 g/dL — ABNORMAL LOW (ref 3.5–5.0)
Alkaline Phosphatase: 68 U/L (ref 38–126)
Anion gap: 11 (ref 5–15)
BUN: 30 mg/dL — ABNORMAL HIGH (ref 8–23)
CO2: 27 mmol/L (ref 22–32)
Calcium: 8.9 mg/dL (ref 8.9–10.3)
Chloride: 99 mmol/L (ref 98–111)
Creatinine, Ser: 1.19 mg/dL — ABNORMAL HIGH (ref 0.44–1.00)
GFR, Estimated: 50 mL/min — ABNORMAL LOW (ref >60)
Glucose, Bld: 176 mg/dL — ABNORMAL HIGH (ref 70–99)
Potassium: 3.5 mmol/L (ref 3.5–5.1)
Sodium: 137 mmol/L (ref 135–145)
Total Bilirubin: 0.5 mg/dL (ref 0.3–1.2)
Total Protein: 6.8 g/dL (ref 6.5–8.1)

## 2020-11-08 MED ORDER — ONDANSETRON HCL 8 MG PO TABS: ORAL_TABLET | ORAL | 1 refills | Status: DC

## 2020-11-08 MED ORDER — SODIUM CHLORIDE 0.9% FLUSH
10.0000 mL | INTRAVENOUS | Status: DC | PRN
  Administered 2020-11-08: 10 mL via INTRAVENOUS

## 2020-11-08 MED ORDER — ONDANSETRON HCL 8 MG PO TABS
8.0000 mg | ORAL_TABLET | Freq: Once | ORAL | Status: AC
  Administered 2020-11-08: 8 mg via ORAL
  Filled 2020-11-08: qty 1

## 2020-11-08 MED ORDER — SODIUM CHLORIDE 0.9 % IV SOLN
500.0000 mg/m2 | Freq: Once | INTRAVENOUS | Status: AC
  Administered 2020-11-08: 800 mg via INTRAVENOUS
  Filled 2020-11-08: qty 12

## 2020-11-08 MED ORDER — CYANOCOBALAMIN 1000 MCG/ML IJ SOLN
1000.0000 ug | Freq: Once | INTRAMUSCULAR | Status: AC
Start: 2020-11-08 — End: 2020-11-08
  Administered 2020-11-08: 1000 ug via INTRAMUSCULAR
  Filled 2020-11-08: qty 1

## 2020-11-08 MED ORDER — SODIUM CHLORIDE 0.9% FLUSH
10.0000 mL | INTRAVENOUS | Status: DC | PRN
  Administered 2020-11-08: 10 mL

## 2020-11-08 MED ORDER — SODIUM CHLORIDE 0.9 % IV SOLN: Freq: Once | INTRAVENOUS | Status: AC

## 2020-11-08 MED ORDER — SODIUM CHLORIDE 0.9 % IV SOLN
10.0000 mg | Freq: Once | INTRAVENOUS | Status: AC
  Administered 2020-11-08: 10 mg via INTRAVENOUS
  Filled 2020-11-08: qty 10

## 2020-11-08 MED ORDER — HEPARIN SOD (PORK) LOCK FLUSH 100 UNIT/ML IV SOLN
500.0000 [IU] | Freq: Once | INTRAVENOUS | Status: AC | PRN
  Administered 2020-11-08: 500 [IU]

## 2020-11-08 NOTE — Patient Instructions (Signed)
[facility]   Discharge Instructions: Thank you for choosing [facility] to provide your oncology and hematology care.   If you have a lab appointment with the [facility], please go directly to the [facility] and check in at the registration area.   Wear comfortable clothing and clothing appropriate for easy access to any Portacath or PICC line.   We strive to give you quality time with your provider. You may need to reschedule your appointment if you arrive late (15 or more minutes).  Arriving late affects you and other patients whose appointments are after yours.  Also, if you miss three or more appointments without notifying the office, you may be dismissed from the clinic at the provider's discretion.      For prescription refill requests, have your pharmacy contact our office and allow 72 hours for refills to be completed.    Today you received the following chemotherapy and/or immunotherapy agents: Pemetrexed (Alimta)       To help prevent nausea and vomiting after your treatment, we encourage you to take your nausea medication as directed.  BELOW ARE SYMPTOMS THAT SHOULD BE REPORTED IMMEDIATELY: *FEVER GREATER THAN 100.4 F (38 C) OR HIGHER *CHILLS OR SWEATING *NAUSEA AND VOMITING THAT IS NOT CONTROLLED WITH YOUR NAUSEA MEDICATION *UNUSUAL SHORTNESS OF BREATH *UNUSUAL BRUISING OR BLEEDING *URINARY PROBLEMS (pain or burning when urinating, or frequent urination) *BOWEL PROBLEMS (unusual diarrhea, constipation, pain near the anus) TENDERNESS IN MOUTH AND THROAT WITH OR WITHOUT PRESENCE OF ULCERS (sore throat, sores in mouth, or a toothache) UNUSUAL RASH, SWELLING OR PAIN  UNUSUAL VAGINAL DISCHARGE OR ITCHING   Items with * indicate a potential emergency and should be followed up as soon as possible or go to the Emergency Department if any problems should occur.  Please show the CHEMOTHERAPY ALERT CARD or IMMUNOTHERAPY ALERT CARD at  check-in to the Emergency Department and triage nurse.  Should you have questions after your visit or need to cancel or reschedule your appointment, please contact [facility]  Dept: [phone]  and follow the prompts.  Office hours are 8:00 a.m. to 4:30 p.m. Monday - Friday. Please note that voicemails left after 4:00 p.m. may not be returned until the following business day.  We are closed weekends and major holidays. You have access to a nurse at all times for urgent questions. Please call the main number to the clinic Dept: [phone] and follow the prompts.   For any non-urgent questions, you may also contact your provider using MyChart. We now offer e-Visits for anyone 11 and older to request care online for non-urgent symptoms. For details visit [email or web address].   Also download the MyChart app! Go to the app store, search "MyChart", open the app, select [facility] [facility], and log in with your MyChart username and password.  Due to Covid, a mask is required upon entering the hospital/clinic. If you do not have a mask, one will be given to you upon arrival. For doctor visits, patients may have 1 support person aged 69 or older with them. For treatment visits, patients cannot have anyone with them due to current Covid guidelines and our immunocompromised population.

## 2020-11-09 ENCOUNTER — Other Ambulatory Visit: Payer: Self-pay | Admitting: Family Medicine

## 2020-11-10 ENCOUNTER — Encounter: Payer: Self-pay | Admitting: Internal Medicine

## 2020-11-21 NOTE — Progress Notes (Signed)
[facility] OFFICE PROGRESS NOTE  [name], MD [place] [id number]  DIAGNOSIS:  Metastatic non-small cell lung cancer initially diagnosed as locally advanced stage IIIB with a right Pancoast tumor involving the vertebral body as well as prominent canal invasion with spinal cord compression in October 2007. The patient also has metastatic disease to the brain in April 2008.  PRIOR THERAPY: Status post concurrent chemoradiation with weekly carboplatin and paclitaxel, last dose was given November 18, 2005. 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 Status post gamma knife radiotherapy to a solitary brain lesion located in the superior frontal area of the brain at [facility] in April of 2008. Status post palliative radiotherapy to the lateral abdominal wall metastatic lesion under the care of Dr. Lisbeth Renshaw, completed March of 2009. Status post 6 cycles of systemic chemotherapy with carboplatin and Alimta. Last dose was given July 26, 2007 with disease stabilization. Gamma knife stereotactic radiotherapy to 2 brain lesions one involving the right frontal dural based as well as right parietal lesion performed on 05/07/2012 under the care of Dr. Vallarie Mare at [facility].  CURRENT THERAPY: Maintenance systemic chemotherapy with Alimta 500 MG/M2 every 3 weeks, status post 229 cycles.  INTERVAL HISTORY: Danielle [name] 70 y.o. female returns to the clinic today for a follow up visit accompanied by her daughter. She is feeling well today without any concerning complaints. She is tolerating her treatment with maintenance Alimta well without any adverse side effects except for occasional nausea/vomiting; however, she did not have any in the interval. She takes zofran for nausea and compazine if needed. She denies any fevers or chills. She reportedly has baseline night sweats for  several years, which are unchanged. She denies any hemoptysis, cough, chest pain.  She reports her baseline shortness of breath with exertion.  She denies any diarrhea or constipation.  She denies any headaches or visual changes. She is here today for evaluation before starting cycle #230.   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 had chemo and radiation for lung ca   Thrush 12/11/2010    ALLERGIES:  is allergic to contrast media [iodinated diagnostic agents], iohexol, sulfa drugs cross reactors, and co-trimoxazole injection [sulfamethoxazole-trimethoprim].  MEDICATIONS:  Current Outpatient Medications  Medication Sig Dispense Refill   albuterol (VENTOLIN HFA) 108 (90 Base) MCG/ACT inhaler TAKE 2 PUFFS BY MOUTH EVERY 6 HOURS AS NEEDED FOR WHEEZE OR SHORTNESS OF BREATH 18 each 5   alendronate (FOSAMAX) 70 MG tablet TAKE 1 TABLET BY MOUTH EVERY 7 (SEVEN) DAYS. TAKE WITH A FULL GLASS OF WATER ON AN EMPTY STOMACH. 12 tablet 1   amLODipine (NORVASC) 10 MG tablet TAKE 1 TABLET BY MOUTH EVERY DAY 90 tablet 0   aspirin 325 MG EC tablet TAKE 1 TABLET BY MOUTH EVERY DAY 90 tablet  0   atorvastatin (LIPITOR) 40 MG tablet TAKE 1 TABLET BY MOUTH EVERY DAY AT 6 PM 90 tablet 3   Cholecalciferol (VITAMIN D3) 50 MCG (2000 UT) TABS Take by mouth.     diphenhydrAMINE (BENADRYL) 25 MG tablet Take 1 tablet (25 mg total) by mouth as directed. 2 hours before CT scan. 5 tablet 0   FeFum-FePoly-FA-B  Cmp-C-Biot (INTEGRA PLUS) CAPS TAKE 1 CAPSULE BY MOUTH EVERY DAY IN THE MORNING 90 capsule 3   fluticasone (FLONASE) 50 MCG/ACT nasal spray Place 1-2 sprays into both nostrils daily as needed. 16 g 5   folic acid (FOLVITE) 1 MG tablet TAKE 1 TABLET BY MOUTH EVERY DAY 90 tablet 1   furosemide (LASIX) 20 MG tablet TAKE 1 TABLET BY MOUTH EVERY DAY AS NEEDED FOR SWELLING 20 tablet 1   INCRUSE ELLIPTA 62.5 MCG/ACT AEPB INHALE 1 PUFF BY MOUTH EVERY DAY 30 each 5   lidocaine-prilocaine (EMLA) cream Apply to the Port-A-Cath site 30-60-minute before treatment. 30 g 0   lisinopril (ZESTRIL) 20 MG tablet TAKE 2 TABLETS (40 MG TOTAL) BY MOUTH DAILY. 180 tablet 1   morphine (MS CONTIN) 60 MG 12 hr tablet Take 1 tablet (60 mg total) by mouth 2 (two) times daily. 60 tablet 0   morphine (MSIR) 30 MG tablet Take 1 tablet (30 mg total) by mouth every 4 (four) hours as needed (breakthrough pain). 60 tablet 0   Olopatadine HCl 0.2 % SOLN INSTILL 1 DROP INTO AFFECTED EYE(S) EVERY DAY 2.5 mL 1   ondansetron (ZOFRAN) 8 MG tablet TAKE 1 TABLET BY MOUTH EVERY 8 HOURS AS NEEDED FOR NAUSEA AND VOMITING 20 tablet 1   predniSONE (DELTASONE) 50 MG tablet 1 tablet by mouth 13 hours ,7 hours and 1 hour  before CT scan 3 tablet 4   prochlorperazine (COMPAZINE) 10 MG tablet Take 1 tablet (10 mg total) by mouth every 6 (six) hours as needed. 30 tablet 2   triamcinolone (KENALOG) 0.025 % ointment APPLY TO AFFECTED AREA TWICE A DAY 30 g 0   No current facility-administered medications for this visit.   Facility-Administered Medications Ordered in Other Visits  Medication Dose Route Frequency Provider Last Rate Last Admin   0.9 %  sodium chloride infusion   Intravenous Once Curt Bears, MD       dexamethasone (DECADRON) 10 mg in sodium chloride 0.9 % 50 mL IVPB  10 mg Intravenous Once Curt Bears, MD       heparin lock flush 100 unit/mL  500 Units Intracatheter Once PRN Curt Bears, MD       ondansetron [facility])  tablet 8 mg  8 mg Oral Once Curt Bears, MD       PEMEtrexed (ALIMTA) 800 mg in sodium chloride 0.9 % 100 mL chemo infusion  500 mg/m2 (Order-Specific) Intravenous Once Curt Bears, MD       sodium chloride flush (NS) 0.9 % injection 10 mL  10 mL Intracatheter PRN Curt Bears, MD        SURGICAL HISTORY:  Past Surgical History:  Procedure Laterality Date   [place]   Left x 2   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   LOOP RECORDER IMPLANT N/A 04/19/2014   Procedure: LOOP RECORDER IMPLANT;  Surgeon: Thompson Grayer, MD;  Location: [facility] CATH LAB;  Service: Cardiovascular;  Laterality: N/A;   OTHER SURGICAL HISTORY  2008   Gamma knife  surgery to remove brain met    PORTACATH PLACEMENT  -[name]   TIP IN CAVOATRIAL JUNCTION   TEE WITHOUT CARDIOVERSION N/A 04/19/2014   Procedure: TRANSESOPHAGEAL ECHOCARDIOGRAM (TEE);  Surgeon: Larey Dresser, MD;  Location: [facility];  Service: Cardiovascular;  Laterality: N/A;    REVIEW OF SYSTEMS:   Review of Systems  Constitutional: Negative for appetite change, chills, fatigue, fever and unexpected weight change. HENT: Negative for mouth sores, nosebleeds, sore throat and trouble swallowing.   Eyes: Negative for eye problems and icterus. Respiratory: Positive for baseline shortness of breath with exertion. Negative for cough, hemoptysis, and wheezing.   Cardiovascular: Positive for bilateral lower extremity swelling. Negative for chest pain. Gastrointestinal: Negative for abdominal pain, diarrhea, nausea, vomiting, and constipation. Genitourinary: Negative for bladder incontinence, difficulty urinating, dysuria, frequency and hematuria.   Musculoskeletal: Negative for back pain, gait problem, neck pain and neck stiffness. Skin: Positive for several scars on her skin. Negative for itching and rash. Neurological: Negative for dizziness, extremity weakness, gait problem,  headaches, light-headedness and seizures. Hematological: Negative for adenopathy. Does not bruise/bleed easily. Psychiatric/Behavioral: Negative for confusion, depression and sleep disturbance. The patient is not nervous/anxious.       PHYSICAL EXAMINATION:  Blood pressure (!) 161/59, pulse (!) 58, temperature 97.8 F (36.6 C), resp. rate 20, weight 150 lb 4.8 oz (68.2 kg), SpO2 98 %.  ECOG PERFORMANCE STATUS: 1  Physical Exam  Constitutional: Oriented to person, place, and time and well-developed, well-nourished, and in no distress.   HENT: Head: Normocephalic and atraumatic. Mouth/Throat: Oropharynx is clear and moist. No oropharyngeal exudate. Eyes: Conjunctivae are normal. Right eye exhibits no discharge. Left eye exhibits no discharge. No scleral icterus. Neck: Normal range of motion. Neck supple. Cardiovascular: Normal rate, regular rhythm, normal heart sounds and intact distal pulses.   Pulmonary/Chest: Effort normal. Some bilateral wheezing. No respiratory distress. No rales. Abdominal: Soft. Bowel sounds are normal. Exhibits no distension and no mass. There is no tenderness.  Musculoskeletal: Positive for bilateral lower extremity edema. Normal range of motion.  Lymphadenopathy:    No cervical adenopathy.  Neurological: Alert and oriented to person, place, and time. Exhibits normal muscle tone. Gait normal. Coordination normal. Ambulates with a cane. Skin: Skin is warm and dry. No rash noted. Not diaphoretic. No erythema. No pallor.  Psychiatric: Mood, memory and judgment normal. Vitals reviewed.  LABORATORY DATA: Lab Results  Component Value Date   WBC 11.8 (H) 11/28/2020   HGB 11.6 (L) 11/28/2020   HCT 36.9 11/28/2020   MCV 96.1 11/28/2020   PLT 348 11/28/2020      Chemistry      Component Value Date/Time   NA 140 11/28/2020 0934   NA 142 12/24/2016 0816   K 4.1 11/28/2020 0934   K 4.4 12/24/2016 0816   CL 107 11/28/2020 0934   CL 106 06/30/2012 1004   CO2  24 11/28/2020 0934   CO2 28 12/24/2016 0816   BUN 15 11/28/2020 0934   BUN 15.8 12/24/2016 0816   CREATININE 0.77 11/28/2020 0934   CREATININE 0.97 10/18/2020 1323   CREATININE 1.1 12/24/2016 0816      Component Value Date/Time   CALCIUM 9.2 11/28/2020 0934   CALCIUM 9.6 12/24/2016 0816   ALKPHOS 79 11/28/2020 0934   ALKPHOS 80 12/24/2016 0816   AST 25 11/28/2020 0934   AST 21 10/18/2020 1323   AST 20 12/24/2016 0816   ALT 14 11/28/2020 0934   ALT 13 10/18/2020 1323   ALT  11 12/24/2016 0816   BILITOT 0.5 11/28/2020 0934   BILITOT 0.3 10/18/2020 1323   BILITOT 0.34 12/24/2016 0816       RADIOGRAPHIC STUDIES:  CT Chest W Contrast  Result Date: 11/05/2020 CLINICAL DATA:  70 year old female with history of non-small cell lung cancer with metastatic disease to the spine, brain and omentum. Follow-up study. EXAM: CT CHEST, ABDOMEN, AND PELVIS WITH CONTRAST TECHNIQUE: Multidetector CT imaging of the chest, abdomen and pelvis was performed following the standard protocol during bolus administration of intravenous contrast. CONTRAST:  30m OMNIPAQUE IOHEXOL 350 MG/ML SOLN COMPARISON:  CT of the chest, abdomen and pelvis 04/27/2020. FINDINGS: CT CHEST FINDINGS Cardiovascular: Heart size is normal. There is no significant pericardial fluid, thickening or pericardial calcification. There is aortic atherosclerosis, as well as atherosclerosis of the great vessels of the mediastinum and the coronary arteries, including calcified atherosclerotic plaque in the left main, left anterior descending, left circumflex and right coronary arteries. Right internal jugular single-lumen porta cath with tip terminating in the right atrium. Mediastinum/Nodes: No pathologically enlarged mediastinal or hilar lymph nodes. Esophagus is unremarkable in appearance. No axillary lymphadenopathy. Lungs/Pleura: As with prior examinations there is some mild chronic postradiation fibrosis in the medial aspect of the right  upper lobe, stable over several prior examinations. Mild chronic scarring is also noted in the right lower lobe at the lung base. Diffuse bronchial wall thickening with moderate centrilobular and paraseptal emphysema. No suspicious appearing pulmonary nodules or masses are noted. No acute consolidative airspace disease. No pleural effusions. Stable mild right pleural thickening. Musculoskeletal: Chronic compression fracture of T3 with vertebral plana appearance. Status post vertebroplasty at T4 and T7. There are no aggressive appearing lytic or blastic lesions noted in the visualized portions of the skeleton. CT ABDOMEN PELVIS FINDINGS Hepatobiliary: No suspicious cystic or solid hepatic lesions. No intra or extrahepatic biliary ductal dilatation. Gallbladder is normal in appearance. Pancreas: No pancreatic mass. No pancreatic ductal dilatation. No pancreatic or peripancreatic fluid collections or inflammatory changes. Spleen: Unremarkable. Adrenals/Urinary Tract: Subcentimeter low-attenuation lesions in both kidneys, too small to characterize, but statistically likely to represent tiny cysts. No hydroureteronephrosis. Urinary bladder is normal in appearance. Bilateral adrenal glands are normal in appearance. Stomach/Bowel: The appearance of the stomach is normal. There is no pathologic dilatation of small bowel or colon. Vascular/Lymphatic: Aortic atherosclerosis, without evidence of aneurysm or dissection in the abdominal or pelvic vasculature. No lymphadenopathy noted in the abdomen or pelvis. Reproductive: Uterus and ovaries are atrophic, but otherwise unremarkable in appearance. Other: No significant volume of ascites.  No pneumoperitoneum. Musculoskeletal: Areas of sclerosis are again noted in the femoral heads bilaterally, suggesting regions of avascular necrosis. No collapse of the articular surface is identified at this time. There are no aggressive appearing lytic or blastic lesions noted in the visualized  portions of the skeleton. IMPRESSION: 1. Stable examination demonstrating no evidence to suggest local recurrence of disease or definite metastatic disease in the chest, abdomen or pelvis. 2. Diffuse bronchial wall thickening with moderate centrilobular and paraseptal emphysema; imaging findings suggestive of underlying COPD. 3. Aortic atherosclerosis, in addition to left main and 3 vessel coronary artery disease. Please note that although the presence of coronary artery calcium documents the presence of coronary artery disease, the severity of this disease and any potential stenosis cannot be assessed on this non-gated CT examination. Assessment for potential risk factor modification, dietary therapy or pharmacologic therapy may be warranted, if clinically indicated. 4. Additional incidental findings, as above. Electronically Signed  By: Vinnie Langton M.D.   On: 11/05/2020 06:50   CT Abdomen Pelvis W Contrast  Result Date: 11/05/2020 CLINICAL DATA:  70 year old female with history of non-small cell lung cancer with metastatic disease to the spine, brain and omentum. Follow-up study. EXAM: CT CHEST, ABDOMEN, AND PELVIS WITH CONTRAST TECHNIQUE: Multidetector CT imaging of the chest, abdomen and pelvis was performed following the standard protocol during bolus administration of intravenous contrast. CONTRAST:  57m OMNIPAQUE IOHEXOL 350 MG/ML SOLN COMPARISON:  CT of the chest, abdomen and pelvis 04/27/2020. FINDINGS: CT CHEST FINDINGS Cardiovascular: Heart size is normal. There is no significant pericardial fluid, thickening or pericardial calcification. There is aortic atherosclerosis, as well as atherosclerosis of the great vessels of the mediastinum and the coronary arteries, including calcified atherosclerotic plaque in the left main, left anterior descending, left circumflex and right coronary arteries. Right internal jugular single-lumen porta cath with tip terminating in the right atrium.  Mediastinum/Nodes: No pathologically enlarged mediastinal or hilar lymph nodes. Esophagus is unremarkable in appearance. No axillary lymphadenopathy. Lungs/Pleura: As with prior examinations there is some mild chronic postradiation fibrosis in the medial aspect of the right upper lobe, stable over several prior examinations. Mild chronic scarring is also noted in the right lower lobe at the lung base. Diffuse bronchial wall thickening with moderate centrilobular and paraseptal emphysema. No suspicious appearing pulmonary nodules or masses are noted. No acute consolidative airspace disease. No pleural effusions. Stable mild right pleural thickening. Musculoskeletal: Chronic compression fracture of T3 with vertebral plana appearance. Status post vertebroplasty at T4 and T7. There are no aggressive appearing lytic or blastic lesions noted in the visualized portions of the skeleton. CT ABDOMEN PELVIS FINDINGS Hepatobiliary: No suspicious cystic or solid hepatic lesions. No intra or extrahepatic biliary ductal dilatation. Gallbladder is normal in appearance. Pancreas: No pancreatic mass. No pancreatic ductal dilatation. No pancreatic or peripancreatic fluid collections or inflammatory changes. Spleen: Unremarkable. Adrenals/Urinary Tract: Subcentimeter low-attenuation lesions in both kidneys, too small to characterize, but statistically likely to represent tiny cysts. No hydroureteronephrosis. Urinary bladder is normal in appearance. Bilateral adrenal glands are normal in appearance. Stomach/Bowel: The appearance of the stomach is normal. There is no pathologic dilatation of small bowel or colon. Vascular/Lymphatic: Aortic atherosclerosis, without evidence of aneurysm or dissection in the abdominal or pelvic vasculature. No lymphadenopathy noted in the abdomen or pelvis. Reproductive: Uterus and ovaries are atrophic, but otherwise unremarkable in appearance. Other: No significant volume of ascites.  No pneumoperitoneum.  Musculoskeletal: Areas of sclerosis are again noted in the femoral heads bilaterally, suggesting regions of avascular necrosis. No collapse of the articular surface is identified at this time. There are no aggressive appearing lytic or blastic lesions noted in the visualized portions of the skeleton. IMPRESSION: 1. Stable examination demonstrating no evidence to suggest local recurrence of disease or definite metastatic disease in the chest, abdomen or pelvis. 2. Diffuse bronchial wall thickening with moderate centrilobular and paraseptal emphysema; imaging findings suggestive of underlying COPD. 3. Aortic atherosclerosis, in addition to left main and 3 vessel coronary artery disease. Please note that although the presence of coronary artery calcium documents the presence of coronary artery disease, the severity of this disease and any potential stenosis cannot be assessed on this non-gated CT examination. Assessment for potential risk factor modification, dietary therapy or pharmacologic therapy may be warranted, if clinically indicated. 4. Additional incidental findings, as above. Electronically Signed   By: DVinnie LangtonM.D.   On: 11/05/2020 06:50     ASSESSMENT/PLAN:  This is a very pleasant 70 year old Caucasian female with metastatic non-small cell lung cancer who was initially diagnosed in October 2007.  She is status post current chemoradiation followed by consolidation chemotherapy.   She is currently undergoing weekly maintainance chemotherapy with single agent Alimta 500 mg/m.  She is status post 229 cycles. She is tolerating treatment well without any concerning complaints except mild nausea/vomiting.  Labs were reviewed. She will proceed cycle #230 today as scheduled.   We will see her for a follow up visit in 3 weeks for evaluation before starting cycle #230.  We will see her for a follow up visit in 3 weeks for evaluation before starting cycle #231.  The patient was advised to call  immediately if she has any concerning symptoms in the interval. The patient voices understanding of current disease status and treatment options and is in agreement with the current care plan. All questions were answered. The patient knows to call the clinic with any problems, questions or concerns. We can certainly see the patient much sooner if necessary  No orders of the defined types were placed in this encounter.    The total time spent in the appointment was 20-29 minutes.  [place], PA-C 11/28/20

## 2020-11-28 ENCOUNTER — Ambulatory Visit: Payer: Medicare (Managed Care) | Admitting: Internal Medicine

## 2020-11-28 ENCOUNTER — Other Ambulatory Visit: Payer: Medicare (Managed Care)

## 2020-11-28 ENCOUNTER — Other Ambulatory Visit: Payer: Self-pay

## 2020-11-28 ENCOUNTER — Inpatient Hospital Stay: Payer: Medicare Other

## 2020-11-28 ENCOUNTER — Inpatient Hospital Stay (HOSPITAL_BASED_OUTPATIENT_CLINIC_OR_DEPARTMENT_OTHER): Payer: Medicare Other | Admitting: Physician Assistant

## 2020-11-28 ENCOUNTER — Ambulatory Visit: Payer: Medicare (Managed Care)

## 2020-11-28 ENCOUNTER — Encounter: Payer: Self-pay | Admitting: Physician Assistant

## 2020-11-28 VITALS — BP 161/59 | HR 58 | Temp 97.8°F | Resp 20 | Wt 150.3 lb

## 2020-11-28 DIAGNOSIS — Z79891 Long term (current) use of opiate analgesic: Secondary | ICD-10-CM | POA: Diagnosis not present

## 2020-11-28 DIAGNOSIS — Z5111 Encounter for antineoplastic chemotherapy: Secondary | ICD-10-CM | POA: Diagnosis not present

## 2020-11-28 DIAGNOSIS — Z95828 Presence of other vascular implants and grafts: Secondary | ICD-10-CM

## 2020-11-28 DIAGNOSIS — C3411 Malignant neoplasm of upper lobe, right bronchus or lung: Secondary | ICD-10-CM

## 2020-11-28 DIAGNOSIS — C7951 Secondary malignant neoplasm of bone: Secondary | ICD-10-CM | POA: Diagnosis not present

## 2020-11-28 DIAGNOSIS — C3410 Malignant neoplasm of upper lobe, unspecified bronchus or lung: Secondary | ICD-10-CM | POA: Diagnosis not present

## 2020-11-28 DIAGNOSIS — Z923 Personal history of irradiation: Secondary | ICD-10-CM | POA: Diagnosis not present

## 2020-11-28 DIAGNOSIS — C786 Secondary malignant neoplasm of retroperitoneum and peritoneum: Secondary | ICD-10-CM | POA: Diagnosis not present

## 2020-11-28 DIAGNOSIS — I10 Essential (primary) hypertension: Secondary | ICD-10-CM | POA: Diagnosis not present

## 2020-11-28 DIAGNOSIS — Z8673 Personal history of transient ischemic attack (TIA), and cerebral infarction without residual deficits: Secondary | ICD-10-CM | POA: Diagnosis not present

## 2020-11-28 DIAGNOSIS — C7931 Secondary malignant neoplasm of brain: Secondary | ICD-10-CM | POA: Diagnosis not present

## 2020-11-28 DIAGNOSIS — E119 Type 2 diabetes mellitus without complications: Secondary | ICD-10-CM | POA: Diagnosis not present

## 2020-11-28 DIAGNOSIS — C3490 Malignant neoplasm of unspecified part of unspecified bronchus or lung: Secondary | ICD-10-CM

## 2020-11-28 DIAGNOSIS — G8929 Other chronic pain: Secondary | ICD-10-CM | POA: Diagnosis not present

## 2020-11-28 DIAGNOSIS — M47812 Spondylosis without myelopathy or radiculopathy, cervical region: Secondary | ICD-10-CM | POA: Diagnosis not present

## 2020-11-28 LAB — CBC WITH DIFFERENTIAL/PLATELET
Abs Immature Granulocytes: 0.05 10*3/uL (ref 0.00–0.07)
Basophils Absolute: 0 10*3/uL (ref 0.0–0.1)
Basophils Relative: 0 %
Eosinophils Absolute: 0.1 10*3/uL (ref 0.0–0.5)
Eosinophils Relative: 0 %
HCT: 36.9 % (ref 36.0–46.0)
Hemoglobin: 11.6 g/dL — ABNORMAL LOW (ref 12.0–15.0)
Immature Granulocytes: 0 %
Lymphocytes Relative: 21 %
Lymphs Abs: 2.4 10*3/uL (ref 0.7–4.0)
MCH: 30.2 pg (ref 26.0–34.0)
MCHC: 31.4 g/dL (ref 30.0–36.0)
MCV: 96.1 fL (ref 80.0–100.0)
Monocytes Absolute: 1.2 10*3/uL — ABNORMAL HIGH (ref 0.1–1.0)
Monocytes Relative: 10 %
Neutro Abs: 8.1 10*3/uL — ABNORMAL HIGH (ref 1.7–7.7)
Neutrophils Relative %: 69 %
Platelets: 348 10*3/uL (ref 150–400)
RBC: 3.84 MIL/uL — ABNORMAL LOW (ref 3.87–5.11)
RDW: 14.5 % (ref 11.5–15.5)
WBC: 11.8 10*3/uL — ABNORMAL HIGH (ref 4.0–10.5)
nRBC: 0 % (ref 0.0–0.2)

## 2020-11-28 LAB — COMPREHENSIVE METABOLIC PANEL
ALT: 14 U/L (ref 0–44)
AST: 25 U/L (ref 15–41)
Albumin: 3.4 g/dL — ABNORMAL LOW (ref 3.5–5.0)
Alkaline Phosphatase: 79 U/L (ref 38–126)
Anion gap: 9 (ref 5–15)
BUN: 15 mg/dL (ref 8–23)
CO2: 24 mmol/L (ref 22–32)
Calcium: 9.2 mg/dL (ref 8.9–10.3)
Chloride: 107 mmol/L (ref 98–111)
Creatinine, Ser: 0.77 mg/dL (ref 0.44–1.00)
GFR, Estimated: >60 mL/min (ref >60)
Glucose, Bld: 146 mg/dL — ABNORMAL HIGH (ref 70–99)
Potassium: 4.1 mmol/L (ref 3.5–5.1)
Sodium: 140 mmol/L (ref 135–145)
Total Bilirubin: 0.5 mg/dL (ref 0.3–1.2)
Total Protein: 7.1 g/dL (ref 6.5–8.1)

## 2020-11-28 MED ORDER — SODIUM CHLORIDE 0.9 % IV SOLN: Freq: Once | INTRAVENOUS | Status: AC

## 2020-11-28 MED ORDER — SODIUM CHLORIDE 0.9 % IV SOLN
500.0000 mg/m2 | Freq: Once | INTRAVENOUS | Status: AC
  Administered 2020-11-28: 800 mg via INTRAVENOUS
  Filled 2020-11-28: qty 20

## 2020-11-28 MED ORDER — HEPARIN SOD (PORK) LOCK FLUSH 100 UNIT/ML IV SOLN
500.0000 [IU] | Freq: Once | INTRAVENOUS | Status: AC | PRN
  Administered 2020-11-28: 500 [IU]

## 2020-11-28 MED ORDER — SODIUM CHLORIDE 0.9 % IV SOLN
10.0000 mg | Freq: Once | INTRAVENOUS | Status: AC
  Administered 2020-11-28: 10 mg via INTRAVENOUS
  Filled 2020-11-28: qty 10

## 2020-11-28 MED ORDER — ONDANSETRON HCL 8 MG PO TABS
8.0000 mg | ORAL_TABLET | Freq: Once | ORAL | Status: AC
  Administered 2020-11-28: 8 mg via ORAL
  Filled 2020-11-28: qty 1

## 2020-11-28 MED ORDER — SODIUM CHLORIDE 0.9% FLUSH
10.0000 mL | INTRAVENOUS | Status: DC | PRN
  Administered 2020-11-28: 10 mL via INTRAVENOUS

## 2020-11-28 MED ORDER — SODIUM CHLORIDE 0.9% FLUSH
10.0000 mL | INTRAVENOUS | Status: DC | PRN
  Administered 2020-11-28: 10 mL

## 2020-11-28 NOTE — Patient Instructions (Signed)
[facility] ONCOLOGY   Discharge Instructions: Thank you for choosing [facility] to provide your oncology and hematology care.   If you have a lab appointment with the [facility], please go directly to the [facility] and check in at the registration area.   Wear comfortable clothing and clothing appropriate for easy access to any Portacath or PICC line.   We strive to give you quality time with your provider. You may need to reschedule your appointment if you arrive late (15 or more minutes).  Arriving late affects you and other patients whose appointments are after yours.  Also, if you miss three or more appointments without notifying the office, you may be dismissed from the clinic at the provider's discretion.      For prescription refill requests, have your pharmacy contact our office and allow 72 hours for refills to be completed.    Today you received the following chemotherapy and/or immunotherapy agents: Pemetrexed (Alimta)       To help prevent nausea and vomiting after your treatment, we encourage you to take your nausea medication as directed.  BELOW ARE SYMPTOMS THAT SHOULD BE REPORTED IMMEDIATELY: *FEVER GREATER THAN 100.4 F (38 C) OR HIGHER *CHILLS OR SWEATING *NAUSEA AND VOMITING THAT IS NOT CONTROLLED WITH YOUR NAUSEA MEDICATION *UNUSUAL SHORTNESS OF BREATH *UNUSUAL BRUISING OR BLEEDING *URINARY PROBLEMS (pain or burning when urinating, or frequent urination) *BOWEL PROBLEMS (unusual diarrhea, constipation, pain near the anus) TENDERNESS IN MOUTH AND THROAT WITH OR WITHOUT PRESENCE OF ULCERS (sore throat, sores in mouth, or a toothache) UNUSUAL RASH, SWELLING OR PAIN  UNUSUAL VAGINAL DISCHARGE OR ITCHING   Items with * indicate a potential emergency and should be followed up as soon as possible or go to the Emergency Department if any problems should occur.  Please show the CHEMOTHERAPY ALERT CARD or IMMUNOTHERAPY ALERT CARD at  check-in to the Emergency Department and triage nurse.  Should you have questions after your visit or need to cancel or reschedule your appointment, please contact [facility]  Dept: [phone]  and follow the prompts.  Office hours are 8:00 a.m. to 4:30 p.m. Monday - Friday. Please note that voicemails left after 4:00 p.m. may not be returned until the following business day.  We are closed weekends and major holidays. You have access to a nurse at all times for urgent questions. Please call the main number to the clinic Dept: [phone] and follow the prompts.   For any non-urgent questions, you may also contact your provider using MyChart. We now offer e-Visits for anyone 62 and older to request care online for non-urgent symptoms. For details visit [email or web address].   Also download the MyChart app! Go to the app store, search "MyChart", open the app, select [facility], and log in with your MyChart username and password.  Due to Covid, a mask is required upon entering the hospital/clinic. If you do not have a mask, one will be given to you upon arrival. For doctor visits, patients may have 1 support person aged 88 or older with them. For treatment visits, patients cannot have anyone with them due to current Covid guidelines and our immunocompromised population.

## 2020-11-28 NOTE — Progress Notes (Signed)
No critical labs need to be addressed urgently. We will discuss labs in detail at upcoming office visit.   

## 2020-11-29 ENCOUNTER — Telehealth: Payer: Self-pay

## 2020-11-29 NOTE — Progress Notes (Addendum)
Chronic Care Management Pharmacy Assistant   Name: Danielle [name]  MRN: [id number] DOB: 05/16/1950  Reason for Encounter: CCM (General Adherence)   Recent office visits:  None since last CCM contact  Recent consult visits:  11/28/2020 - Oncology - Patient presented for weekly maintenance chemotherapy with single agent Alimta 500 mg/m.  Patient is status post 229 cycles. Patient completed cycle 230.  11/08/2020 - Oncology - Patient presented for weekly maintenance chemotherapy with single agent Alimta 500 mg/m.  Patient is status post 228 cycles. Patient completed cycle 229.   Hospital visits:  None in previous 6 months  Medications: Outpatient Encounter Medications as of 11/29/2020  Medication Sig   albuterol (VENTOLIN HFA) 108 (90 Base) MCG/ACT inhaler TAKE 2 PUFFS BY MOUTH EVERY 6 HOURS AS NEEDED FOR WHEEZE OR SHORTNESS OF BREATH   alendronate (FOSAMAX) 70 MG tablet TAKE 1 TABLET BY MOUTH EVERY 7 (SEVEN) DAYS. TAKE WITH A FULL GLASS OF WATER ON AN EMPTY STOMACH.   amLODipine (NORVASC) 10 MG tablet TAKE 1 TABLET BY MOUTH EVERY DAY   aspirin 325 MG EC tablet TAKE 1 TABLET BY MOUTH EVERY DAY   atorvastatin (LIPITOR) 40 MG tablet TAKE 1 TABLET BY MOUTH EVERY DAY AT 6 PM   Cholecalciferol (VITAMIN D3) 50 MCG (2000 UT) TABS Take by mouth.   diphenhydrAMINE (BENADRYL) 25 MG tablet Take 1 tablet (25 mg total) by mouth as directed. 2 hours before CT scan.   FeFum-FePoly-FA-B Cmp-C-Biot (INTEGRA PLUS) CAPS TAKE 1 CAPSULE BY MOUTH EVERY DAY IN THE MORNING   fluticasone (FLONASE) 50 MCG/ACT nasal spray Place 1-2 sprays into both nostrils daily as needed.   folic acid (FOLVITE) 1 MG tablet TAKE 1 TABLET BY MOUTH EVERY DAY   furosemide (LASIX) 20 MG tablet TAKE 1 TABLET BY MOUTH EVERY DAY AS NEEDED FOR SWELLING   INCRUSE ELLIPTA 62.5 MCG/ACT AEPB INHALE 1 PUFF BY MOUTH EVERY DAY   lidocaine-prilocaine (EMLA) cream Apply to the Port-A-Cath site 30-60-minute before treatment.   lisinopril  (ZESTRIL) 20 MG tablet TAKE 2 TABLETS (40 MG TOTAL) BY MOUTH DAILY.   morphine (MS CONTIN) 60 MG 12 hr tablet Take 1 tablet (60 mg total) by mouth 2 (two) times daily.   morphine (MSIR) 30 MG tablet Take 1 tablet (30 mg total) by mouth every 4 (four) hours as needed (breakthrough pain).   Olopatadine HCl 0.2 % SOLN INSTILL 1 DROP INTO AFFECTED EYE(S) EVERY DAY   ondansetron (ZOFRAN) 8 MG tablet TAKE 1 TABLET BY MOUTH EVERY 8 HOURS AS NEEDED FOR NAUSEA AND VOMITING   predniSONE (DELTASONE) 50 MG tablet 1 tablet by mouth 13 hours ,7 hours and 1 hour  before CT scan   prochlorperazine (COMPAZINE) 10 MG tablet Take 1 tablet (10 mg total) by mouth every 6 (six) hours as needed.   triamcinolone (KENALOG) 0.025 % ointment APPLY TO AFFECTED AREA TWICE A DAY   No facility-administered encounter medications on file as of 11/29/2020.   Contacted [facility] on 12/06/2020 for general disease state and medication adherence call.   Patient is not > 5 days past due for refill on the following medications per chart history:  [facility] Medications: Medication Name/mg Last Fill Days Supply Lisinopril 20 mg  10/17/2020 90 Atorvastatin 40 mg  09/21/2020 90  Spoke with patient. She was unable to talk long due to being sick from Chemotherapy. Patient did state that she does not have any concerns with her medications. Patient stated everything is going  well with her medications. Patient was feeling so poorly I did not want to keep her on the phone.   Care Gaps: Annual wellness visit in last year? Yes 09/05/2020 Most Recent BP reading: 161/59 on 11/28/2020 . If Diabetic: Most recent A1C reading: 5.7 on 09/04/2020 Last eye exam / retinopathy screening: Up to date Last diabetic foot exam: Up to date  Chemotherapy appointment on 12/19/2020  Debbora Dus, CPP notified  Marijean Niemann, [facility] Assistant [phone]  Time Spent: 56 Minutes  I have reviewed the care management and care  coordination activities outlined in this encounter and I am certifying that I agree with the content of this note. No further action required.  Debbora Dus, PharmD Clinical Pharmacist [facility] at [facility] [phone]

## 2020-12-02 ENCOUNTER — Other Ambulatory Visit: Payer: Self-pay | Admitting: Physician Assistant

## 2020-12-02 DIAGNOSIS — C3410 Malignant neoplasm of upper lobe, unspecified bronchus or lung: Secondary | ICD-10-CM

## 2020-12-04 ENCOUNTER — Telehealth: Payer: Self-pay

## 2020-12-04 ENCOUNTER — Encounter: Payer: Self-pay | Admitting: Internal Medicine

## 2020-12-04 ENCOUNTER — Other Ambulatory Visit: Payer: Self-pay | Admitting: Internal Medicine

## 2020-12-04 DIAGNOSIS — C3490 Malignant neoplasm of unspecified part of unspecified bronchus or lung: Secondary | ICD-10-CM

## 2020-12-04 DIAGNOSIS — C3410 Malignant neoplasm of upper lobe, unspecified bronchus or lung: Secondary | ICD-10-CM

## 2020-12-04 MED ORDER — MORPHINE SULFATE ER 60 MG PO TBCR: 60.0000 mg | EXTENDED_RELEASE_TABLET | Freq: Two times a day (BID) | ORAL | 0 refills | Status: DC

## 2020-12-04 NOTE — Telephone Encounter (Signed)
Pt requests refill of MS Contin asap. She ran out over the weekend.

## 2020-12-05 ENCOUNTER — Other Ambulatory Visit: Payer: Self-pay | Admitting: Internal Medicine

## 2020-12-05 ENCOUNTER — Other Ambulatory Visit: Payer: Self-pay | Admitting: Physician Assistant

## 2020-12-05 ENCOUNTER — Other Ambulatory Visit: Payer: Self-pay | Admitting: Family Medicine

## 2020-12-05 DIAGNOSIS — C7931 Secondary malignant neoplasm of brain: Secondary | ICD-10-CM

## 2020-12-05 DIAGNOSIS — C3411 Malignant neoplasm of upper lobe, right bronchus or lung: Secondary | ICD-10-CM

## 2020-12-05 DIAGNOSIS — D649 Anemia, unspecified: Secondary | ICD-10-CM

## 2020-12-18 MED FILL — Dexamethasone Sodium Phosphate Inj 100 MG/10ML: INTRAMUSCULAR | Qty: 1 | Status: AC

## 2020-12-19 ENCOUNTER — Inpatient Hospital Stay: Payer: Medicare Other | Attending: Physician Assistant

## 2020-12-19 ENCOUNTER — Encounter: Payer: Self-pay | Admitting: Internal Medicine

## 2020-12-19 ENCOUNTER — Other Ambulatory Visit: Payer: Self-pay

## 2020-12-19 ENCOUNTER — Inpatient Hospital Stay: Payer: Medicare Other

## 2020-12-19 ENCOUNTER — Other Ambulatory Visit: Payer: Medicare (Managed Care)

## 2020-12-19 ENCOUNTER — Ambulatory Visit: Payer: Medicare (Managed Care)

## 2020-12-19 ENCOUNTER — Ambulatory Visit: Payer: Medicare (Managed Care) | Admitting: Internal Medicine

## 2020-12-19 ENCOUNTER — Inpatient Hospital Stay (HOSPITAL_BASED_OUTPATIENT_CLINIC_OR_DEPARTMENT_OTHER): Payer: Medicare Other | Admitting: Internal Medicine

## 2020-12-19 VITALS — BP 155/63 | HR 96 | Temp 97.4°F | Resp 18 | Wt 148.3 lb

## 2020-12-19 DIAGNOSIS — I10 Essential (primary) hypertension: Secondary | ICD-10-CM | POA: Diagnosis not present

## 2020-12-19 DIAGNOSIS — Z5111 Encounter for antineoplastic chemotherapy: Secondary | ICD-10-CM

## 2020-12-19 DIAGNOSIS — E119 Type 2 diabetes mellitus without complications: Secondary | ICD-10-CM | POA: Diagnosis not present

## 2020-12-19 DIAGNOSIS — Z7982 Long term (current) use of aspirin: Secondary | ICD-10-CM | POA: Insufficient documentation

## 2020-12-19 DIAGNOSIS — C3411 Malignant neoplasm of upper lobe, right bronchus or lung: Secondary | ICD-10-CM | POA: Diagnosis not present

## 2020-12-19 DIAGNOSIS — C7931 Secondary malignant neoplasm of brain: Secondary | ICD-10-CM | POA: Diagnosis present

## 2020-12-19 DIAGNOSIS — Z9221 Personal history of antineoplastic chemotherapy: Secondary | ICD-10-CM | POA: Insufficient documentation

## 2020-12-19 DIAGNOSIS — Z79899 Other long term (current) drug therapy: Secondary | ICD-10-CM | POA: Diagnosis not present

## 2020-12-19 DIAGNOSIS — C3410 Malignant neoplasm of upper lobe, unspecified bronchus or lung: Secondary | ICD-10-CM | POA: Insufficient documentation

## 2020-12-19 DIAGNOSIS — C3490 Malignant neoplasm of unspecified part of unspecified bronchus or lung: Secondary | ICD-10-CM

## 2020-12-19 DIAGNOSIS — Z95828 Presence of other vascular implants and grafts: Secondary | ICD-10-CM

## 2020-12-19 DIAGNOSIS — Z923 Personal history of irradiation: Secondary | ICD-10-CM | POA: Insufficient documentation

## 2020-12-19 DIAGNOSIS — C7951 Secondary malignant neoplasm of bone: Secondary | ICD-10-CM | POA: Insufficient documentation

## 2020-12-19 LAB — CBC WITH DIFFERENTIAL/PLATELET
Abs Immature Granulocytes: 0.09 10*3/uL — ABNORMAL HIGH (ref 0.00–0.07)
Basophils Absolute: 0 10*3/uL (ref 0.0–0.1)
Basophils Relative: 0 %
Eosinophils Absolute: 0 10*3/uL (ref 0.0–0.5)
Eosinophils Relative: 0 %
HCT: 32.9 % — ABNORMAL LOW (ref 36.0–46.0)
Hemoglobin: 10.9 g/dL — ABNORMAL LOW (ref 12.0–15.0)
Immature Granulocytes: 1 %
Lymphocytes Relative: 15 %
Lymphs Abs: 1.7 10*3/uL (ref 0.7–4.0)
MCH: 31.5 pg (ref 26.0–34.0)
MCHC: 33.1 g/dL (ref 30.0–36.0)
MCV: 95.1 fL (ref 80.0–100.0)
Monocytes Absolute: 1.1 10*3/uL — ABNORMAL HIGH (ref 0.1–1.0)
Monocytes Relative: 9 %
Neutro Abs: 8.6 10*3/uL — ABNORMAL HIGH (ref 1.7–7.7)
Neutrophils Relative %: 75 %
Platelets: 479 10*3/uL — ABNORMAL HIGH (ref 150–400)
RBC: 3.46 MIL/uL — ABNORMAL LOW (ref 3.87–5.11)
RDW: 14.4 % (ref 11.5–15.5)
WBC: 11.5 10*3/uL — ABNORMAL HIGH (ref 4.0–10.5)
nRBC: 0 % (ref 0.0–0.2)

## 2020-12-19 LAB — COMPREHENSIVE METABOLIC PANEL
ALT: 12 U/L (ref 0–44)
AST: 24 U/L (ref 15–41)
Albumin: 3.1 g/dL — ABNORMAL LOW (ref 3.5–5.0)
Alkaline Phosphatase: 80 U/L (ref 38–126)
Anion gap: 8 (ref 5–15)
BUN: 21 mg/dL (ref 8–23)
CO2: 23 mmol/L (ref 22–32)
Calcium: 9.4 mg/dL (ref 8.9–10.3)
Chloride: 105 mmol/L (ref 98–111)
Creatinine, Ser: 0.93 mg/dL (ref 0.44–1.00)
GFR, Estimated: >60 mL/min (ref >60)
Glucose, Bld: 140 mg/dL — ABNORMAL HIGH (ref 70–99)
Potassium: 4.7 mmol/L (ref 3.5–5.1)
Sodium: 136 mmol/L (ref 135–145)
Total Bilirubin: 0.5 mg/dL (ref 0.3–1.2)
Total Protein: 7.4 g/dL (ref 6.5–8.1)

## 2020-12-19 MED ORDER — SODIUM CHLORIDE 0.9 % IV SOLN: Freq: Once | INTRAVENOUS | Status: AC

## 2020-12-19 MED ORDER — SODIUM CHLORIDE 0.9 % IV SOLN
10.0000 mg | Freq: Once | INTRAVENOUS | Status: AC
  Administered 2020-12-19: 10 mg via INTRAVENOUS
  Filled 2020-12-19: qty 10

## 2020-12-19 MED ORDER — ONDANSETRON HCL 8 MG PO TABS
8.0000 mg | ORAL_TABLET | Freq: Once | ORAL | Status: AC
  Administered 2020-12-19: 8 mg via ORAL
  Filled 2020-12-19: qty 1

## 2020-12-19 MED ORDER — SODIUM CHLORIDE 0.9% FLUSH
10.0000 mL | INTRAVENOUS | Status: DC | PRN
  Administered 2020-12-19: 10 mL via INTRAVENOUS

## 2020-12-19 MED ORDER — SODIUM CHLORIDE 0.9 % IV SOLN
500.0000 mg/m2 | Freq: Once | INTRAVENOUS | Status: AC
  Administered 2020-12-19: 800 mg via INTRAVENOUS
  Filled 2020-12-19: qty 20

## 2020-12-19 NOTE — Progress Notes (Signed)
[facility] Telephone:([phone]   Fax:([phone]  OFFICE PROGRESS NOTE  [name], MD [place] [address]  DIAGNOSIS: Metastatic non-small cell lung cancer initially diagnosed as locally advanced stage IIIB with a right Pancoast tumor involving the vertebral body as well as prominent canal invasion with spinal cord compression in October 2007. The patient also has metastatic disease to the brain in April 2008.  PRIOR THERAPY: Status post concurrent chemoradiation with weekly carboplatin and paclitaxel, last dose was given November 18, 2005. 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 Status post gamma knife radiotherapy to a solitary brain lesion located in the superior frontal area of the brain at [facility] in April of 2008. Status post palliative radiotherapy to the lateral abdominal wall metastatic lesion under the care of Dr. Lisbeth Renshaw, completed March of 2009. Status post 6 cycles of systemic chemotherapy with carboplatin and Alimta. Last dose was given July 26, 2007 with disease stabilization. Gamma knife stereotactic radiotherapy to 2 brain lesions one involving the right frontal dural based as well as right parietal lesion performed on 05/07/2012 under the care of Dr. Vallarie Mare at [facility].  CURRENT THERAPY: Maintenance systemic chemotherapy with Alimta 500 MG/M2 every 3 weeks, status post 230 cycles.  INTERVAL HISTORY: Danielle [name] 70 y.o. female returns to the clinic today for follow-up visit accompanied by her daughter.  The patient is feeling fine today with no concerning complaints.  She denied having any current chest pain, shortness of breath, cough or hemoptysis.  She continues to have intermittent nausea with no vomiting, diarrhea or constipation.  She has no headache or visual changes.  She has no recent weight loss or night  sweats.  She has been tolerating her maintenance treatment with Alimta fairly well.  The patient is here today for evaluation before starting cycle #231.  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 had chemo and radiation for lung ca   Thrush 12/11/2010    ALLERGIES:  is allergic to contrast media [iodinated diagnostic agents], iohexol, sulfa drugs cross reactors, and co-trimoxazole injection [sulfamethoxazole-trimethoprim].  MEDICATIONS:  Current Outpatient Medications  Medication Sig Dispense Refill   albuterol (VENTOLIN HFA) 108 (90 Base) MCG/ACT inhaler TAKE 2 PUFFS BY MOUTH EVERY 6 HOURS AS NEEDED FOR WHEEZE OR SHORTNESS OF BREATH 18 each 5   alendronate (FOSAMAX) 70 MG tablet TAKE 1 TABLET BY MOUTH EVERY 7 (SEVEN) DAYS. TAKE WITH A FULL GLASS OF WATER ON AN EMPTY STOMACH. 12 tablet 1   amLODipine (NORVASC) 10 MG tablet TAKE 1 TABLET BY MOUTH EVERY DAY 90 tablet 1   aspirin 325 MG EC tablet TAKE 1 TABLET BY MOUTH EVERY DAY 90 tablet 0   atorvastatin (LIPITOR) 40 MG tablet TAKE 1 TABLET BY MOUTH EVERY DAY AT 6 PM  90 tablet 3   Cholecalciferol (VITAMIN D3) 50 MCG (2000 UT) TABS Take by mouth.     diphenhydrAMINE (BENADRYL) 25 MG tablet Take 1 tablet (25 mg total) by mouth as directed. 2 hours before CT scan. 5 tablet 0   FeFum-FePoly-FA-B Cmp-C-Biot (FOLIVANE-PLUS) CAPS TAKE 1 CAPSULE BY MOUTH EVERY DAY IN THE MORNING 90 capsule 3   fluticasone (FLONASE) 50 MCG/ACT nasal  spray Place 1-2 sprays into both nostrils daily as needed. 16 g 5   folic acid (FOLVITE) 1 MG tablet TAKE 1 TABLET BY MOUTH EVERY DAY 90 tablet 1   furosemide (LASIX) 20 MG tablet TAKE 1 TABLET BY MOUTH EVERY DAY AS NEEDED FOR SWELLING 20 tablet 1   INCRUSE ELLIPTA 62.5 MCG/ACT AEPB INHALE 1 PUFF BY MOUTH EVERY DAY 30 each 5   lidocaine-prilocaine (EMLA) cream Apply to the Port-A-Cath site 30-60-minute before treatment. 30 g 0   lisinopril (ZESTRIL) 20 MG tablet TAKE 2 TABLETS (40 MG TOTAL) BY MOUTH DAILY. 180 tablet 1   morphine (MS CONTIN) 60 MG 12 hr tablet Take 1 tablet (60 mg total) by mouth 2 (two) times daily. 60 tablet 0   morphine (MSIR) 30 MG tablet Take 1 tablet (30 mg total) by mouth every 4 (four) hours as needed (breakthrough pain). 60 tablet 0   Olopatadine HCl 0.2 % SOLN INSTILL 1 DROP INTO AFFECTED EYE(S) EVERY DAY 2.5 mL 1   ondansetron (ZOFRAN) 8 MG tablet TAKE 1 TABLET BY MOUTH EVERY 8 HOURS AS NEEDED FOR NAUSEA AND VOMITING 20 tablet 1   predniSONE (DELTASONE) 50 MG tablet 1 tablet by mouth 13 hours ,7 hours and 1 hour  before CT scan 3 tablet 4   prochlorperazine (COMPAZINE) 10 MG tablet Take 1 tablet (10 mg total) by mouth every 6 (six) hours as needed. 30 tablet 2   triamcinolone (KENALOG) 0.025 % ointment APPLY TO AFFECTED AREA TWICE A DAY 30 g 0   No current facility-administered medications for this visit.    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   LOOP RECORDER IMPLANT N/A 04/19/2014   Procedure: LOOP RECORDER IMPLANT;  Surgeon: Thompson Grayer, MD;  Location: [facility] CATH LAB;  Service: Cardiovascular;  Laterality: N/A;   OTHER SURGICAL HISTORY  2008   Gamma knife surgery to remove brain met    PORTACATH PLACEMENT  -[name]   TIP IN CAVOATRIAL JUNCTION   TEE WITHOUT CARDIOVERSION N/A 04/19/2014   Procedure:  TRANSESOPHAGEAL ECHOCARDIOGRAM (TEE);  Surgeon: Larey Dresser, MD;  Location: [facility];  Service: Cardiovascular;  Laterality: N/A;    REVIEW OF SYSTEMS:  A comprehensive review of systems was negative except for: Gastrointestinal: positive for nausea   PHYSICAL EXAMINATION: General appearance: alert, cooperative, and no distress Head: Normocephalic, without obvious abnormality, atraumatic Neck: no adenopathy, no JVD, supple, symmetrical, trachea midline, and thyroid not enlarged, symmetric, no tenderness/mass/nodules Lymph nodes: Cervical, supraclavicular, and axillary nodes normal. Resp: clear to auscultation bilaterally Back: symmetric, no curvature. ROM normal. No CVA tenderness. Cardio: regular rate and rhythm, S1, S2 normal, no murmur, click, rub or gallop GI: soft, non-tender; bowel sounds normal; no masses,  no organomegaly Extremities: extremities normal, atraumatic, no cyanosis or edema  ECOG PERFORMANCE STATUS: 1 - Symptomatic but completely ambulatory  Blood pressure (!) 155/63, pulse  96, temperature (!) 97.4 F (36.3 C), temperature source Tympanic, resp. rate 18, weight 148 lb 4.8 oz (67.3 kg), SpO2 94 %.  LABORATORY DATA: Lab Results  Component Value Date   WBC 11.5 (H) 12/19/2020   HGB 10.9 (L) 12/19/2020   HCT 32.9 (L) 12/19/2020   MCV 95.1 12/19/2020   PLT 479 (H) 12/19/2020      Chemistry      Component Value Date/Time   NA 140 11/28/2020 0934   NA 142 12/24/2016 0816   K 4.1 11/28/2020 0934   K 4.4 12/24/2016 0816   CL 107 11/28/2020 0934   CL 106 06/30/2012 1004   CO2 24 11/28/2020 0934   CO2 28 12/24/2016 0816   BUN 15 11/28/2020 0934   BUN 15.8 12/24/2016 0816   CREATININE 0.77 11/28/2020 0934   CREATININE 0.97 10/18/2020 1323   CREATININE 1.1 12/24/2016 0816      Component Value Date/Time   CALCIUM 9.2 11/28/2020 0934   CALCIUM 9.6 12/24/2016 0816   ALKPHOS 79 11/28/2020 0934   ALKPHOS 80 12/24/2016 0816   AST 25 11/28/2020 0934   AST  21 10/18/2020 1323   AST 20 12/24/2016 0816   ALT 14 11/28/2020 0934   ALT 13 10/18/2020 1323   ALT 11 12/24/2016 0816   BILITOT 0.5 11/28/2020 0934   BILITOT 0.3 10/18/2020 1323   BILITOT 0.34 12/24/2016 0816       RADIOGRAPHIC STUDIES: No results found.  ASSESSMENT AND PLAN: This is a very pleasant 70 years old white female with metastatic non-small cell lung cancer diagnosed in October 2007 status post concurrent chemoradiation followed by consolidation chemotherapy and she is currently on maintenance treatment with single agent Alimta status post 230 cycles. The patient has been tolerating this treatment well with no concerning adverse effects. I recommended for her to proceed with cycle #231 today as planned. I will see her back for follow-up visit in 3 weeks for evaluation before the next cycle of her treatment. She was advised to call immediately if she has any other concerning symptoms in the interval. The patient voices understanding of current disease status and treatment options and is in agreement with the current care plan. All questions were answered. The patient knows to call the clinic with any problems, questions or concerns. We can certainly see the patient much sooner if necessary.  Disclaimer: This note was dictated with voice recognition software. Similar sounding words can inadvertently be transcribed and may not be corrected upon review.

## 2020-12-19 NOTE — Patient Instructions (Signed)
[facility] ONCOLOGY   Discharge Instructions: Thank you for choosing [facility] to provide your oncology and hematology care.   If you have a lab appointment with the [facility], please go directly to the [facility] and check in at the registration area.   Wear comfortable clothing and clothing appropriate for easy access to any Portacath or PICC line.   We strive to give you quality time with your provider. You may need to reschedule your appointment if you arrive late (15 or more minutes).  Arriving late affects you and other patients whose appointments are after yours.  Also, if you miss three or more appointments without notifying the office, you may be dismissed from the clinic at the providers discretion.      For prescription refill requests, have your pharmacy contact our office and allow 72 hours for refills to be completed.    Today you received the following chemotherapy and/or immunotherapy agents: Pemetrexed (Alimta)       To help prevent nausea and vomiting after your treatment, we encourage you to take your nausea medication as directed.  BELOW ARE SYMPTOMS THAT SHOULD BE REPORTED IMMEDIATELY: *FEVER GREATER THAN 100.4 F (38 C) OR HIGHER *CHILLS OR SWEATING *NAUSEA AND VOMITING THAT IS NOT CONTROLLED WITH YOUR NAUSEA MEDICATION *UNUSUAL SHORTNESS OF BREATH *UNUSUAL BRUISING OR BLEEDING *URINARY PROBLEMS (pain or burning when urinating, or frequent urination) *BOWEL PROBLEMS (unusual diarrhea, constipation, pain near the anus) TENDERNESS IN MOUTH AND THROAT WITH OR WITHOUT PRESENCE OF ULCERS (sore throat, sores in mouth, or a toothache) UNUSUAL RASH, SWELLING OR PAIN  UNUSUAL VAGINAL DISCHARGE OR ITCHING   Items with * indicate a potential emergency and should be followed up as soon as possible or go to the Emergency Department if any problems should occur.  Please show the CHEMOTHERAPY ALERT CARD or IMMUNOTHERAPY ALERT CARD at  check-in to the Emergency Department and triage nurse.  Should you have questions after your visit or need to cancel or reschedule your appointment, please contact [facility]  Dept: [phone]  and follow the prompts.  Office hours are 8:00 a.m. to 4:30 p.m. Monday - Friday. Please note that voicemails left after 4:00 p.m. may not be returned until the following business day.  We are closed weekends and major holidays. You have access to a nurse at all times for urgent questions. Please call the main number to the clinic Dept: [phone] and follow the prompts.   For any non-urgent questions, you may also contact your provider using MyChart. We now offer e-Visits for anyone 62 and older to request care online for non-urgent symptoms. For details visit [email or web address].   Also download the MyChart app! Go to the app store, search "MyChart", open the app, select [facility], and log in with your MyChart username and password.  Due to Covid, a mask is required upon entering the hospital/clinic. If you do not have a mask, one will be given to you upon arrival. For doctor visits, patients may have 1 support person aged 94 or older with them. For treatment visits, patients cannot have anyone with them due to current Covid guidelines and our immunocompromised population.

## 2020-12-21 ENCOUNTER — Other Ambulatory Visit: Payer: Self-pay | Admitting: Family Medicine

## 2020-12-21 NOTE — Telephone Encounter (Signed)
Proair is covered versus ventolin per pharmacy, please review. Thank you

## 2020-12-22 MED ORDER — ALBUTEROL SULFATE HFA 108 (90 BASE) MCG/ACT IN AERS: INHALATION_SPRAY | RESPIRATORY_TRACT | 5 refills | Status: DC

## 2021-01-02 ENCOUNTER — Telehealth: Payer: Self-pay | Admitting: Medical Oncology

## 2021-01-02 ENCOUNTER — Other Ambulatory Visit: Payer: Self-pay | Admitting: Internal Medicine

## 2021-01-02 DIAGNOSIS — C3410 Malignant neoplasm of upper lobe, unspecified bronchus or lung: Secondary | ICD-10-CM

## 2021-01-02 DIAGNOSIS — C3490 Malignant neoplasm of unspecified part of unspecified bronchus or lung: Secondary | ICD-10-CM

## 2021-01-02 MED ORDER — MORPHINE SULFATE ER 60 MG PO TBCR: 60.0000 mg | EXTENDED_RELEASE_TABLET | Freq: Two times a day (BID) | ORAL | 0 refills | Status: DC

## 2021-01-02 NOTE — Telephone Encounter (Signed)
Requested Refill for MS contin 60 mg .

## 2021-01-07 ENCOUNTER — Encounter: Payer: Self-pay | Admitting: Internal Medicine

## 2021-01-09 ENCOUNTER — Inpatient Hospital Stay (HOSPITAL_BASED_OUTPATIENT_CLINIC_OR_DEPARTMENT_OTHER): Payer: Medicare Other | Admitting: Internal Medicine

## 2021-01-09 ENCOUNTER — Encounter: Payer: Self-pay | Admitting: Internal Medicine

## 2021-01-09 ENCOUNTER — Inpatient Hospital Stay: Payer: Medicare Other | Attending: Physician Assistant

## 2021-01-09 ENCOUNTER — Other Ambulatory Visit: Payer: Self-pay

## 2021-01-09 ENCOUNTER — Inpatient Hospital Stay: Payer: Medicare Other

## 2021-01-09 VITALS — BP 132/69 | HR 93 | Temp 98.3°F | Resp 19 | Ht 62.75 in | Wt 147.8 lb

## 2021-01-09 DIAGNOSIS — Z923 Personal history of irradiation: Secondary | ICD-10-CM | POA: Diagnosis not present

## 2021-01-09 DIAGNOSIS — C3411 Malignant neoplasm of upper lobe, right bronchus or lung: Secondary | ICD-10-CM

## 2021-01-09 DIAGNOSIS — C3410 Malignant neoplasm of upper lobe, unspecified bronchus or lung: Secondary | ICD-10-CM | POA: Diagnosis not present

## 2021-01-09 DIAGNOSIS — Z5111 Encounter for antineoplastic chemotherapy: Secondary | ICD-10-CM | POA: Insufficient documentation

## 2021-01-09 DIAGNOSIS — Z9221 Personal history of antineoplastic chemotherapy: Secondary | ICD-10-CM | POA: Insufficient documentation

## 2021-01-09 DIAGNOSIS — Z7952 Long term (current) use of systemic steroids: Secondary | ICD-10-CM | POA: Diagnosis not present

## 2021-01-09 DIAGNOSIS — Z95828 Presence of other vascular implants and grafts: Secondary | ICD-10-CM

## 2021-01-09 DIAGNOSIS — C7931 Secondary malignant neoplasm of brain: Secondary | ICD-10-CM | POA: Insufficient documentation

## 2021-01-09 DIAGNOSIS — Z79899 Other long term (current) drug therapy: Secondary | ICD-10-CM | POA: Diagnosis not present

## 2021-01-09 DIAGNOSIS — E119 Type 2 diabetes mellitus without complications: Secondary | ICD-10-CM | POA: Diagnosis not present

## 2021-01-09 DIAGNOSIS — I10 Essential (primary) hypertension: Secondary | ICD-10-CM | POA: Insufficient documentation

## 2021-01-09 DIAGNOSIS — C3490 Malignant neoplasm of unspecified part of unspecified bronchus or lung: Secondary | ICD-10-CM

## 2021-01-09 DIAGNOSIS — Z7982 Long term (current) use of aspirin: Secondary | ICD-10-CM | POA: Insufficient documentation

## 2021-01-09 LAB — COMPREHENSIVE METABOLIC PANEL
ALT: 10 U/L (ref 0–44)
AST: 21 U/L (ref 15–41)
Albumin: 3.7 g/dL (ref 3.5–5.0)
Alkaline Phosphatase: 81 U/L (ref 38–126)
Anion gap: 8 (ref 5–15)
BUN: 11 mg/dL (ref 8–23)
CO2: 25 mmol/L (ref 22–32)
Calcium: 9.4 mg/dL (ref 8.9–10.3)
Chloride: 104 mmol/L (ref 98–111)
Creatinine, Ser: 0.86 mg/dL (ref 0.44–1.00)
GFR, Estimated: >60 mL/min (ref >60)
Glucose, Bld: 161 mg/dL — ABNORMAL HIGH (ref 70–99)
Potassium: 4 mmol/L (ref 3.5–5.1)
Sodium: 137 mmol/L (ref 135–145)
Total Bilirubin: 0.6 mg/dL (ref 0.3–1.2)
Total Protein: 7.6 g/dL (ref 6.5–8.1)

## 2021-01-09 LAB — CBC WITH DIFFERENTIAL/PLATELET
Abs Immature Granulocytes: 0.06 10*3/uL (ref 0.00–0.07)
Basophils Absolute: 0 10*3/uL (ref 0.0–0.1)
Basophils Relative: 0 %
Eosinophils Absolute: 0 10*3/uL (ref 0.0–0.5)
Eosinophils Relative: 0 %
HCT: 36.2 % (ref 36.0–46.0)
Hemoglobin: 11.6 g/dL — ABNORMAL LOW (ref 12.0–15.0)
Immature Granulocytes: 1 %
Lymphocytes Relative: 9 %
Lymphs Abs: 1.2 10*3/uL (ref 0.7–4.0)
MCH: 30.4 pg (ref 26.0–34.0)
MCHC: 32 g/dL (ref 30.0–36.0)
MCV: 95 fL (ref 80.0–100.0)
Monocytes Absolute: 1.7 10*3/uL — ABNORMAL HIGH (ref 0.1–1.0)
Monocytes Relative: 13 %
Neutro Abs: 10 10*3/uL — ABNORMAL HIGH (ref 1.7–7.7)
Neutrophils Relative %: 77 %
Platelets: 448 10*3/uL — ABNORMAL HIGH (ref 150–400)
RBC: 3.81 MIL/uL — ABNORMAL LOW (ref 3.87–5.11)
RDW: 14.9 % (ref 11.5–15.5)
WBC: 12.9 10*3/uL — ABNORMAL HIGH (ref 4.0–10.5)
nRBC: 0 % (ref 0.0–0.2)

## 2021-01-09 MED ORDER — SODIUM CHLORIDE 0.9 % IV SOLN
500.0000 mg/m2 | Freq: Once | INTRAVENOUS | Status: AC
  Administered 2021-01-09: 800 mg via INTRAVENOUS
  Filled 2021-01-09: qty 12

## 2021-01-09 MED ORDER — SODIUM CHLORIDE 0.9 % IV SOLN: Freq: Once | INTRAVENOUS | Status: AC

## 2021-01-09 MED ORDER — SODIUM CHLORIDE 0.9% FLUSH
10.0000 mL | INTRAVENOUS | Status: DC | PRN
  Administered 2021-01-09: 10 mL

## 2021-01-09 MED ORDER — SODIUM CHLORIDE 0.9% FLUSH
10.0000 mL | INTRAVENOUS | Status: DC | PRN
  Administered 2021-01-09: 10 mL via INTRAVENOUS

## 2021-01-09 MED ORDER — SODIUM CHLORIDE 0.9 % IV SOLN
10.0000 mg | Freq: Once | INTRAVENOUS | Status: AC
  Administered 2021-01-09: 10 mg via INTRAVENOUS
  Filled 2021-01-09: qty 10

## 2021-01-09 MED ORDER — ONDANSETRON HCL 8 MG PO TABS
8.0000 mg | ORAL_TABLET | Freq: Once | ORAL | Status: AC
  Administered 2021-01-09: 8 mg via ORAL
  Filled 2021-01-09: qty 1

## 2021-01-09 MED ORDER — CYANOCOBALAMIN 1000 MCG/ML IJ SOLN
1000.0000 ug | Freq: Once | INTRAMUSCULAR | Status: AC
  Administered 2021-01-09: 1000 ug via INTRAMUSCULAR
  Filled 2021-01-09: qty 1

## 2021-01-09 MED ORDER — HEPARIN SOD (PORK) LOCK FLUSH 100 UNIT/ML IV SOLN
500.0000 [IU] | Freq: Once | INTRAVENOUS | Status: AC | PRN
  Administered 2021-01-09: 500 [IU]

## 2021-01-09 NOTE — Patient Instructions (Signed)
[facility] ONCOLOGY  Discharge Instructions: Thank you for choosing [facility] to provide your oncology and hematology care.   If you have a lab appointment with the [facility], please go directly to the [facility] and check in at the registration area.   Wear comfortable clothing and clothing appropriate for easy access to any Portacath or PICC line.   We strive to give you quality time with your provider. You may need to reschedule your appointment if you arrive late (15 or more minutes).  Arriving late affects you and other patients whose appointments are after yours.  Also, if you miss three or more appointments without notifying the office, you may be dismissed from the clinic at the providers discretion.      For prescription refill requests, have your pharmacy contact our office and allow 72 hours for refills to be completed.    Today you received the following chemotherapy and/or immunotherapy agents Alimta      To help prevent nausea and vomiting after your treatment, we encourage you to take your nausea medication as directed.  BELOW ARE SYMPTOMS THAT SHOULD BE REPORTED IMMEDIATELY: *FEVER GREATER THAN 100.4 F (38 C) OR HIGHER *CHILLS OR SWEATING *NAUSEA AND VOMITING THAT IS NOT CONTROLLED WITH YOUR NAUSEA MEDICATION *UNUSUAL SHORTNESS OF BREATH *UNUSUAL BRUISING OR BLEEDING *URINARY PROBLEMS (pain or burning when urinating, or frequent urination) *BOWEL PROBLEMS (unusual diarrhea, constipation, pain near the anus) TENDERNESS IN MOUTH AND THROAT WITH OR WITHOUT PRESENCE OF ULCERS (sore throat, sores in mouth, or a toothache) UNUSUAL RASH, SWELLING OR PAIN  UNUSUAL VAGINAL DISCHARGE OR ITCHING   Items with * indicate a potential emergency and should be followed up as soon as possible or go to the Emergency Department if any problems should occur.  Please show the CHEMOTHERAPY ALERT CARD or IMMUNOTHERAPY ALERT CARD at check-in to the  Emergency Department and triage nurse.  Should you have questions after your visit or need to cancel or reschedule your appointment, please contact [facility]  Dept: [phone]  and follow the prompts.  Office hours are 8:00 a.m. to 4:30 p.m. Monday - Friday. Please note that voicemails left after 4:00 p.m. may not be returned until the following business day.  We are closed weekends and major holidays. You have access to a nurse at all times for urgent questions. Please call the main number to the clinic Dept: [phone] and follow the prompts.   For any non-urgent questions, you may also contact your provider using MyChart. We now offer e-Visits for anyone 58 and older to request care online for non-urgent symptoms. For details visit [email or web address].   Also download the MyChart app! Go to the app store, search "MyChart", open the app, select [facility], and log in with your MyChart username and password.  Due to Covid, a mask is required upon entering the hospital/clinic. If you do not have a mask, one will be given to you upon arrival. For doctor visits, patients may have 1 support person aged 32 or older with them. For treatment visits, patients cannot have anyone with them due to current Covid guidelines and our immunocompromised population.

## 2021-01-09 NOTE — Progress Notes (Signed)
[place] Telephone:([phone]) [phone]   Fax:(336) [phone]  OFFICE PROGRESS NOTE  [name], MD [place] [address]  DIAGNOSIS: Metastatic non-small cell lung cancer initially diagnosed as locally advanced stage IIIB with a right Pancoast tumor involving the vertebral body as well as prominent canal invasion with spinal cord compression in October 2007. The patient also has metastatic disease to the brain in April 2008.  PRIOR THERAPY: Status post concurrent chemoradiation with weekly carboplatin and paclitaxel, last dose was given November 18, 2005. 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 Status post gamma knife radiotherapy to a solitary brain lesion located in the superior frontal area of the brain at [facility] in April of 2008. Status post palliative radiotherapy to the lateral abdominal wall metastatic lesion under the care of Dr. Lisbeth Renshaw, completed March of 2009. Status post 6 cycles of systemic chemotherapy with carboplatin and Alimta. Last dose was given July 26, 2007 with disease stabilization. Gamma knife stereotactic radiotherapy to 2 brain lesions one involving the right frontal dural based as well as right parietal lesion performed on 05/07/2012 under the care of Dr. Vallarie Mare at [facility].  CURRENT THERAPY: Maintenance systemic chemotherapy with Alimta 500 MG/M2 every 3 weeks, status post 231 cycles.  INTERVAL HISTORY: Danielle [name] 71 y.o. female returns to the clinic today for follow-up visit accompanied by her daughter.  The patient is feeling fine today with no concerning complaints except for some recent nasal congestion.  She denied having any chest pain, shortness of breath, cough or hemoptysis.  She denied having any fever or chills.  She has no headache or visual changes.  She has occasional nausea with no vomiting, diarrhea or  constipation.  She is here today for evaluation before starting cycle #232 of her treatment.  MEDICAL HISTORY: Past Medical History:  Diagnosis Date   Anemia    Antineoplastic chemotherapy induced anemia 01/23/2016   Carcinomas, basal cell 01/22/2019   two places on face   CVA (cerebral vascular accident) ([place]) 04/2014   pt states had 2 cva's within 2 wks   Diabetes mellitus without complication ([place])    DJD (degenerative joint disease), cervical    Fibromyalgia    H/O: pneumonia    History of tobacco abuse quit 10/08   on nicotine patch   Hypertension    Hypokalemia    Lung cancer ([place]) dx'd 09/2005   hx of non-small cell: metastasis to brain. had chemo and radiation for lung ca   Thrush 12/11/2010    ALLERGIES:  is allergic to contrast media [iodinated contrast media], iohexol, sulfa drugs cross reactors, and co-trimoxazole injection [sulfamethoxazole-trimethoprim].  MEDICATIONS:  Current Outpatient Medications  Medication Sig Dispense Refill   albuterol (VENTOLIN HFA) 108 (90 Base) MCG/ACT inhaler Sig: TAKE 2 PUFFS BY MOUTH EVERY 6 HOURS AS NEEDED FOR WHEEZE OR SHORTNESS OF BREATH 18 each 5   alendronate (FOSAMAX) 70 MG tablet TAKE 1 TABLET BY MOUTH EVERY 7 (SEVEN) DAYS. TAKE WITH A FULL GLASS OF WATER ON AN EMPTY STOMACH. 12 tablet 1   amLODipine (NORVASC) 10 MG tablet TAKE 1 TABLET BY MOUTH EVERY DAY 90 tablet 1   aspirin 325 MG EC tablet TAKE 1 TABLET BY MOUTH EVERY DAY 90 tablet 0   atorvastatin (LIPITOR) 40 MG tablet TAKE 1 TABLET BY MOUTH EVERY DAY AT 6 PM 90 tablet 3   Cholecalciferol (VITAMIN D3)  50 MCG (2000 UT) TABS Take by mouth.     diphenhydrAMINE (BENADRYL) 25 MG tablet Take 1 tablet (25 mg total) by mouth as directed. 2 hours before CT scan. (Patient not taking: Reported on 12/19/2020) 5 tablet 0   FeFum-FePoly-FA-B Cmp-C-Biot (FOLIVANE-PLUS) CAPS TAKE 1 CAPSULE BY MOUTH EVERY DAY IN THE MORNING 90 capsule 3   fluticasone (FLONASE) 50 MCG/ACT nasal spray Place 1-2  sprays into both nostrils daily as needed. 16 g 5   folic acid (FOLVITE) 1 MG tablet TAKE 1 TABLET BY MOUTH EVERY DAY 90 tablet 1   furosemide (LASIX) 20 MG tablet TAKE 1 TABLET BY MOUTH EVERY DAY AS NEEDED FOR SWELLING (Patient not taking: Reported on 12/19/2020) 20 tablet 1   INCRUSE ELLIPTA 62.5 MCG/ACT AEPB INHALE 1 PUFF BY MOUTH EVERY DAY 30 each 5   lidocaine-prilocaine (EMLA) cream Apply to the Port-A-Cath site 30-60-minute before treatment. 30 g 0   lisinopril (ZESTRIL) 20 MG tablet TAKE 2 TABLETS (40 MG TOTAL) BY MOUTH DAILY. 180 tablet 1   morphine (MS CONTIN) 60 MG 12 hr tablet Take 1 tablet (60 mg total) by mouth 2 (two) times daily. 60 tablet 0   morphine (MSIR) 30 MG tablet Take 1 tablet (30 mg total) by mouth every 4 (four) hours as needed (breakthrough pain). 60 tablet 0   Olopatadine HCl 0.2 % SOLN INSTILL 1 DROP INTO AFFECTED EYE(S) EVERY DAY 2.5 mL 1   ondansetron (ZOFRAN) 8 MG tablet TAKE 1 TABLET BY MOUTH EVERY 8 HOURS AS NEEDED FOR NAUSEA AND VOMITING 20 tablet 1   predniSONE (DELTASONE) 50 MG tablet 1 tablet by mouth 13 hours ,7 hours and 1 hour  before CT scan (Patient not taking: Reported on 12/19/2020) 3 tablet 4   prochlorperazine (COMPAZINE) 10 MG tablet Take 1 tablet (10 mg total) by mouth every 6 (six) hours as needed. 30 tablet 2   triamcinolone (KENALOG) 0.025 % ointment APPLY TO AFFECTED AREA TWICE A DAY (Patient not taking: Reported on 12/19/2020) 30 g 0   No current facility-administered medications for this visit.   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    LOOP RECORDER IMPLANT N/A 04/19/2014   Procedure: LOOP RECORDER IMPLANT;  Surgeon: Thompson Grayer, MD;  [facility] C[facility] LAB;  Service: Cardiovascular;  Laterality: N/A;   OTHER SURGICAL HISTORY  2008   Gamma knife surgery to remove brain met    PORTACATH PLACEMENT  -[name]   TIP IN CAVOATRIAL JUNCTION   TEE WITHOUT CARDIOVERSION N/A 04/19/2014   Procedure: TRANSESOPHAGEAL ECHOCARDIOGRAM (TEE);  Surgeon: Larey Dresser, MD;  Location: [facility];  Service: Cardiovascular;  Laterality: N/A;    REVIEW OF SYSTEMS:  A comprehensive review of systems was negative except for: Constitutional: positive for fatigue Gastrointestinal: positive for nausea   PHYSICAL EXAMINATION: General appearance: alert, cooperative, and no distress Head: Normocephalic, without obvious abnormality, atraumatic Neck: no adenopathy, no JVD, supple, symmetrical, trachea midline, and thyroid not enlarged, symmetric, no tenderness/mass/nodules Lymph nodes: Cervical, supraclavicular, and axillary nodes  normal. Resp: clear to auscultation bilaterally Back: symmetric, no curvature. ROM normal. No CVA tenderness. Cardio: regular rate and rhythm, S1, S2 normal, no murmur, click, rub or gallop GI: soft, non-tender; bowel sounds normal; no masses,  no organomegaly Extremities: extremities normal, atraumatic, no cyanosis or edema  ECOG PERFORMANCE STATUS: 1 - Symptomatic but completely ambulatory  Blood pressure 132/69, pulse 93, temperature 98.3 F (36.8 C), temperature source Tympanic, resp. rate 19, height 5' 2.75" (1.594 m), weight 147 lb 12.8 oz (67 kg), SpO2 100 %.  LABORATORY DATA: Lab Results  Component Value Date   WBC 12.9 (H) 01/09/2021   HGB 11.6 (L) 01/09/2021   HCT 36.2 01/09/2021   MCV 95.0 01/09/2021   PLT 448 (H) 01/09/2021      Chemistry      Component Value Date/Time   NA 137 01/09/2021 0917   NA 142 12/24/2016 0816   K 4.0 01/09/2021 0917   K 4.4 12/24/2016 0816   CL 104 01/09/2021 0917    CL 106 06/30/2012 1004   CO2 25 01/09/2021 0917   CO2 28 12/24/2016 0816   BUN 11 01/09/2021 0917   BUN 15.8 12/24/2016 0816   CREATININE 0.86 01/09/2021 0917   CREATININE 0.97 10/18/2020 1323   CREATININE 1.1 12/24/2016 0816      Component Value Date/Time   CALCIUM 9.4 01/09/2021 0917   CALCIUM 9.6 12/24/2016 0816   ALKPHOS 81 01/09/2021 0917   ALKPHOS 80 12/24/2016 0816   AST 21 01/09/2021 0917   AST 21 10/18/2020 1323   AST 20 12/24/2016 0816   ALT 10 01/09/2021 0917   ALT 13 10/18/2020 1323   ALT 11 12/24/2016 0816   BILITOT 0.6 01/09/2021 0917   BILITOT 0.3 10/18/2020 1323   BILITOT 0.34 12/24/2016 0816       RADIOGRAPHIC STUDIES: No results found.  ASSESSMENT AND PLAN: This is a very pleasant 71 years old white female with metastatic non-small cell lung cancer diagnosed in October 2007 status post concurrent chemoradiation followed by consolidation chemotherapy and she is currently on maintenance treatment with single agent Alimta status post 231 cycles. The patient continues to tolerate her treatment well with no concerning adverse effect except for mild fatigue and occasional nausea. I recommended for her to proceed with cycle #232 today as planned. I will see her back for follow-up visit in 3 weeks for evaluation before the next cycle of her treatment. For pain management she will continue her current treatment with MS Contin and morphine sulfate. For the nausea she will continue her treatment with Zofran on as-needed basis. The patient was advised to call immediately if she has any other concerning symptoms in the interval The patient voices understanding of current disease status and treatment options and is in agreement with the current care plan. All questions were answered. The patient knows to call the clinic with any problems, questions or concerns. We can certainly see the patient much sooner if necessary.  Disclaimer: This note was dictated with voice  recognition software. Similar sounding words can inadvertently be transcribed and may not be corrected upon review.

## 2021-01-11 ENCOUNTER — Other Ambulatory Visit: Payer: Self-pay | Admitting: Family Medicine

## 2021-01-17 ENCOUNTER — Telehealth: Payer: Self-pay | Admitting: Medical Oncology

## 2021-01-17 NOTE — Telephone Encounter (Signed)
Update re Nausea and vomiting last weekend-  spoke to daughter in law who said pt is better today , no nausea . She is up today  talking to her and eating. I told Leanna to have pt or her call me back for any concerns.

## 2021-01-23 ENCOUNTER — Encounter: Payer: Self-pay | Admitting: Internal Medicine

## 2021-01-25 NOTE — Progress Notes (Deleted)
[place] OFFICE PROGRESS NOTE  [place] Sanders, MD [place] [address]  DIAGNOSIS: Metastatic non-small cell lung cancer initially diagnosed as locally advanced stage IIIB with a right Pancoast tumor involving the vertebral body as well as prominent canal invasion with spinal cord compression in October 2007. The patient also has metastatic disease to the brain in April 2008.    PRIOR THERAPY: Status post concurrent chemoradiation with weekly carboplatin and paclitaxel, last dose was given November 18, 2005. 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 Status post gamma knife radiotherapy to a solitary brain lesion located in the superior frontal area of the brain at [facility] in April of 2008. Status post palliative radiotherapy to the lateral abdominal wall metastatic lesion under the care of Dr. Lisbeth Renshaw, completed March of 2009. Status post 6 cycles of systemic chemotherapy with carboplatin and Alimta. Last dose was given July 26, 2007 with disease stabilization. Gamma knife stereotactic radiotherapy to 2 brain lesions one involving the right frontal dural based as well as right parietal lesion performed on 05/07/2012 under the care of Dr. Vallarie Mare at [facility].  CURRENT THERAPY: Maintenance systemic chemotherapy with Alimta 500 MG/M2 every 3 weeks, status post 232 cycles.  INTERVAL HISTORY: Danielle [name] 71 y.o. female returns returns to the clinic today for a follow up visit accompanied by her daughter. She is feeling well today without any concerning complaints. In the interval since her last appointment, she had some nausea/vomiting and ***. She *** and her symptoms resolved after ***>  She is tolerating her treatment with maintenance Alimta well without any adverse side effects except for occasional nausea/vomiting. She takes zofran for nausea and compazine  if needed. She denies any fevers or chills. She reportedly has baseline night sweats for several years, which are unchanged. She denies any hemoptysis, cough, chest pain.  She reports her baseline shortness of breath with exertion.  She denies any diarrhea or constipation.  She denies any headaches or visual changes. She is here today for evaluation before starting cycle #233.   MEDICAL HISTORY: Past Medical History:  Diagnosis Date   Anemia    Antineoplastic chemotherapy induced anemia 01/23/2016   Carcinomas, basal cell 01/22/2019   two places on face   CVA (cerebral vascular accident) ([place]) 04/2014   pt states had 2 cva's within 2 wks   Diabetes mellitus without complication ([place])    DJD (degenerative joint disease), cervical    Fibromyalgia    H/O: pneumonia    History of tobacco abuse quit 10/08   on nicotine patch   Hypertension    Hypokalemia    Lung cancer ([place]) dx'd 09/2005   hx of non-small cell: metastasis to brain. had chemo and radiation for lung ca   Thrush 12/11/2010    ALLERGIES:  is allergic to contrast media [iodinated contrast media], iohexol, sulfa drugs cross reactors, and co-trimoxazole injection [sulfamethoxazole-trimethoprim].  MEDICATIONS:  Current Outpatient Medications  Medication Sig Dispense Refill   albuterol (VENTOLIN HFA) 108 (90 Base) MCG/ACT inhaler Sig: TAKE 2 PUFFS BY MOUTH EVERY 6 HOURS AS NEEDED FOR WHEEZE OR SHORTNESS OF BREATH 18 each 5   alendronate (FOSAMAX) 70 MG tablet TAKE 1 TABLET BY MOUTH EVERY 7 (SEVEN) DAYS. TAKE WITH A FULL GLASS OF WATER ON AN EMPTY STOMACH. 12 tablet 1   amLODipine (NORVASC) 10 MG tablet TAKE 1 TABLET BY MOUTH EVERY DAY 90 tablet 1  aspirin 325 MG EC tablet TAKE 1 TABLET BY MOUTH EVERY DAY 90 tablet 0   atorvastatin (LIPITOR) 40 MG tablet TAKE 1 TABLET BY MOUTH EVERY DAY AT 6 PM 90 tablet 2   Cholecalciferol (VITAMIN D3) 50 MCG (2000 UT) TABS Take by mouth.     diphenhydrAMINE (BENADRYL) 25 MG tablet Take 1 tablet  (25 mg total) by mouth as directed. 2 hours before CT scan. (Patient not taking: Reported on 12/19/2020) 5 tablet 0   FeFum-FePoly-FA-B Cmp-C-Biot (FOLIVANE-PLUS) CAPS TAKE 1 CAPSULE BY MOUTH EVERY DAY IN THE MORNING 90 capsule 3   fluticasone (FLONASE) 50 MCG/ACT nasal spray Place 1-2 sprays into both nostrils daily as needed. 16 g 5   folic acid (FOLVITE) 1 MG tablet TAKE 1 TABLET BY MOUTH EVERY DAY 90 tablet 1   furosemide (LASIX) 20 MG tablet TAKE 1 TABLET BY MOUTH EVERY DAY AS NEEDED FOR SWELLING (Patient not taking: Reported on 12/19/2020) 20 tablet 1   INCRUSE ELLIPTA 62.5 MCG/ACT AEPB INHALE 1 PUFF BY MOUTH EVERY DAY 30 each 5   lidocaine-prilocaine (EMLA) cream Apply to the Port-A-Cath site 30-60-minute before treatment. 30 g 0   lisinopril (ZESTRIL) 20 MG tablet TAKE 2 TABLETS (40 MG TOTAL) BY MOUTH DAILY. 180 tablet 1   morphine (MS CONTIN) 60 MG 12 hr tablet Take 1 tablet (60 mg total) by mouth 2 (two) times daily. 60 tablet 0   morphine (MSIR) 30 MG tablet Take 1 tablet (30 mg total) by mouth every 4 (four) hours as needed (breakthrough pain). 60 tablet 0   Olopatadine HCl 0.2 % SOLN INSTILL 1 DROP INTO AFFECTED EYE(S) EVERY DAY 2.5 mL 1   ondansetron (ZOFRAN) 8 MG tablet TAKE 1 TABLET BY MOUTH EVERY 8 HOURS AS NEEDED FOR NAUSEA AND VOMITING 20 tablet 1   predniSONE (DELTASONE) 50 MG tablet 1 tablet by mouth 13 hours ,7 hours and 1 hour  before CT scan (Patient not taking: Reported on 12/19/2020) 3 tablet 4   prochlorperazine (COMPAZINE) 10 MG tablet Take 1 tablet (10 mg total) by mouth every 6 (six) hours as needed. 30 tablet 2   triamcinolone (KENALOG) 0.025 % ointment APPLY TO AFFECTED AREA TWICE A DAY (Patient not taking: Reported on 12/19/2020) 30 g 0   No current facility-administered medications for this visit.    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   LOOP RECORDER IMPLANT N/A 04/19/2014   Procedure: LOOP RECORDER IMPLANT;  Surgeon: Thompson Grayer, MD;  Location[facility] C[facility] LAB;  Service: Cardiovascular;  Laterality: N/A;   OTHER SURGICAL HISTORY  2008   Gamma knife surgery to remove brain met    PORTACATH PLACEMENT  -[name]   TIP IN CAVOATRIAL JUNCTION   TEE WITHOUT CARDIOVERSION N/A 04/19/2014   Procedure: TRANSESOPHAGEAL ECHOCARDIOGRAM (TEE);  Surgeon: Larey Dresser, MD;  Location: [facility];  Service: Cardiovascular;  Laterality: N/A;    REVIEW OF SYSTEMS:   Review of Systems  Constitutional: Negative for appetite change, chills, fatigue, fever and unexpected weight change.  HENT:   Negative for mouth sores, nosebleeds, sore throat and trouble swallowing.   Eyes: Negative for eye problems and icterus.  Respiratory: Negative for cough, hemoptysis, shortness of breath and wheezing.   Cardiovascular: Negative for chest pain and leg swelling.  Gastrointestinal: Negative for abdominal pain, constipation, diarrhea, nausea and vomiting.  Genitourinary: Negative for bladder incontinence, difficulty urinating, dysuria, frequency and hematuria.   Musculoskeletal: Negative for back pain, gait problem, neck pain and neck stiffness.  Skin: Negative for itching and rash.  Neurological: Negative for dizziness, extremity weakness, gait problem, headaches, light-headedness and seizures.  Hematological: Negative for adenopathy. Does not bruise/bleed easily.  Psychiatric/Behavioral: Negative for confusion, depression and sleep disturbance. The patient is not nervous/anxious.     PHYSICAL EXAMINATION:  There were no vitals taken for this visit.  ECOG PERFORMANCE STATUS: {CHL ONC ECOG [id number]  Physical Exam  Constitutional: Oriented to person, place, and time and well-developed, well-nourished, and in no distress. No distress.  HENT:  Head: Normocephalic and atraumatic.  Mouth/Throat: Oropharynx  is clear and moist. No oropharyngeal exudate.  Eyes: Conjunctivae are normal. Right eye exhibits no discharge. Left eye exhibits no discharge. No scleral icterus.  Neck: Normal range of motion. Neck supple.  Cardiovascular: Normal rate, regular rhythm, normal heart sounds and intact distal pulses.   Pulmonary/Chest: Effort normal and breath sounds normal. No respiratory distress. No wheezes. No rales.  Abdominal: Soft. Bowel sounds are normal. Exhibits no distension and no mass. There is no tenderness.  Musculoskeletal: Normal range of motion. Exhibits no edema.  Lymphadenopathy:    No cervical adenopathy.  Neurological: Alert and oriented to person, place, and time. Exhibits normal muscle tone. Gait normal. Coordination normal.  Skin: Skin is warm and dry. No rash noted. Not diaphoretic. No erythema. No pallor.  Psychiatric: Mood, memory and judgment normal.  Vitals reviewed.  LABORATORY DATA: Lab Results  Component Value Date   WBC 12.9 (H) 01/09/2021   HGB 11.6 (L) 01/09/2021   HCT 36.2 01/09/2021   MCV 95.0 01/09/2021   PLT 448 (H) 01/09/2021      Chemistry      Component Value Date/Time   NA 137 01/09/2021 0917   NA 142 12/24/2016 0816   K 4.0 01/09/2021 0917   K 4.4 12/24/2016 0816   CL 104 01/09/2021 0917   CL 106 06/30/2012 1004   CO2 25 01/09/2021 0917   CO2 28 12/24/2016 0816   BUN 11 01/09/2021 0917   BUN 15.8 12/24/2016 0816   CREATININE 0.86 01/09/2021 0917   CREATININE 0.97 10/18/2020 1323   CREATININE 1.1 12/24/2016 0816      Component Value Date/Time   CALCIUM 9.4 01/09/2021 0917   CALCIUM 9.6 12/24/2016 0816   ALKPHOS 81 01/09/2021 0917   ALKPHOS 80 12/24/2016 0816   AST 21 01/09/2021 0917   AST 21 10/18/2020 1323   AST 20 12/24/2016 0816   ALT 10 01/09/2021 0917   ALT 13 10/18/2020 1323   ALT 11 12/24/2016 0816   BILITOT 0.6 01/09/2021 0917   BILITOT 0.3 10/18/2020 1323   BILITOT 0.34 12/24/2016 0816       RADIOGRAPHIC STUDIES:  No  results found.   ASSESSMENT/PLAN:  This is a very pleasant 71 year old Caucasian female with metastatic non-small cell lung cancer who was initially diagnosed in October 2007.  She is status post current chemoradiation followed by consolidation chemotherapy.   She is currently undergoing weekly maintainance chemotherapy with single agent Alimta 500 mg/m.  She is status post 232 cycles. She is tolerating treatment well without any concerning complaints except mild nausea/vomiting.   Labs were reviewed. She will proceed cycle #233 today as scheduled.    We will see her for a follow up visit in 3 weeks for  evaluation before starting cycle #234   The patient was advised to call immediately if she has any concerning symptoms in the interval. The patient voices understanding of current disease status and treatment options and is in agreement with the current care plan. All questions were answered. The patient knows to call the clinic with any problems, questions or concerns. We can certainly see the patient much sooner if necessary        No orders of the defined types were placed in this encounter.    I spent {CHL ONC TIME VISIT - HYIF[facility]:[id number]} counseling the patient face to face. The total time spent in the appointment was {CHL ONC TIME VISIT - [facility]:[id number]}.  Chris Cripps L Roddie Riegler, PA-C 01/25/21

## 2021-01-26 ENCOUNTER — Encounter: Payer: Self-pay | Admitting: Internal Medicine

## 2021-01-29 ENCOUNTER — Telehealth: Payer: Self-pay | Admitting: Medical Oncology

## 2021-01-29 ENCOUNTER — Other Ambulatory Visit: Payer: Self-pay | Admitting: Physician Assistant

## 2021-01-29 DIAGNOSIS — C3490 Malignant neoplasm of unspecified part of unspecified bronchus or lung: Secondary | ICD-10-CM

## 2021-01-29 MED FILL — Dexamethasone Sodium Phosphate Inj 100 MG/10ML: INTRAMUSCULAR | Qty: 1 | Status: AC

## 2021-01-29 NOTE — Telephone Encounter (Signed)
Pt requested to cancel appt through feb . She discussed with Julien Nordmann about taking a chemo break .  She will keep her March appt. For port flush with labs and [place].   Appts cancelled.

## 2021-01-29 NOTE — Telephone Encounter (Signed)
Pt notified that CT scans were ordered with expected date of March 3 and she will get port flush with labs an hour prior to CT scan. Schedule message sent.

## 2021-01-30 ENCOUNTER — Inpatient Hospital Stay: Payer: Medicare Other

## 2021-01-30 ENCOUNTER — Inpatient Hospital Stay: Payer: Medicare Other | Admitting: Physician Assistant

## 2021-02-14 ENCOUNTER — Other Ambulatory Visit: Payer: Self-pay | Admitting: Physician Assistant

## 2021-02-14 DIAGNOSIS — C3490 Malignant neoplasm of unspecified part of unspecified bronchus or lung: Secondary | ICD-10-CM

## 2021-02-14 DIAGNOSIS — C3410 Malignant neoplasm of upper lobe, unspecified bronchus or lung: Secondary | ICD-10-CM

## 2021-02-14 MED ORDER — MORPHINE SULFATE 30 MG PO TABS: 30.0000 mg | ORAL_TABLET | ORAL | 0 refills | Status: DC | PRN

## 2021-02-14 MED ORDER — MORPHINE SULFATE ER 60 MG PO TBCR: 60.0000 mg | EXTENDED_RELEASE_TABLET | Freq: Two times a day (BID) | ORAL | 0 refills | Status: DC

## 2021-02-19 ENCOUNTER — Other Ambulatory Visit: Payer: Self-pay | Admitting: Physician Assistant

## 2021-02-19 ENCOUNTER — Other Ambulatory Visit: Payer: Self-pay | Admitting: Family Medicine

## 2021-02-19 DIAGNOSIS — C3411 Malignant neoplasm of upper lobe, right bronchus or lung: Secondary | ICD-10-CM

## 2021-02-20 ENCOUNTER — Ambulatory Visit: Payer: Medicare Other

## 2021-02-20 ENCOUNTER — Other Ambulatory Visit: Payer: Medicare Other

## 2021-02-20 ENCOUNTER — Ambulatory Visit: Payer: Medicare Other | Admitting: Physician Assistant

## 2021-02-22 DIAGNOSIS — C7931 Secondary malignant neoplasm of brain: Secondary | ICD-10-CM | POA: Diagnosis not present

## 2021-02-22 DIAGNOSIS — C3490 Malignant neoplasm of unspecified part of unspecified bronchus or lung: Secondary | ICD-10-CM | POA: Diagnosis not present

## 2021-02-28 ENCOUNTER — Telehealth: Payer: Self-pay | Admitting: Family Medicine

## 2021-02-28 DIAGNOSIS — E119 Type 2 diabetes mellitus without complications: Secondary | ICD-10-CM

## 2021-02-28 NOTE — Telephone Encounter (Signed)
-----   Message from Ellamae Sia sent at 02/23/2021  3:02 PM EST ----- Regarding: Lab orders for Wednesday, 3.1.23 Lab orders for a 6 month follow up appt

## 2021-03-07 ENCOUNTER — Other Ambulatory Visit: Payer: Medicare (Managed Care)

## 2021-03-08 ENCOUNTER — Telehealth: Payer: Self-pay

## 2021-03-08 NOTE — Telephone Encounter (Signed)
Contacted the patient about her CT appointment tomorrow 03/09/21. Cassandra PA wanted to make sure the patient had prednisone for her scan tomorrow since she has an allergy to the dye. I went over the times the patient needs to take the prednisone, which is at 12 am, 6 am, and 12 pm. The patient verbalized understanding and informed me she did have prednisone. Vito Backers was notified of this. There were no further questions or concerns.  ?

## 2021-03-09 ENCOUNTER — Inpatient Hospital Stay: Payer: Medicare Other | Attending: Physician Assistant

## 2021-03-09 ENCOUNTER — Ambulatory Visit (HOSPITAL_COMMUNITY)
Admission: RE | Admit: 2021-03-09 | Discharge: 2021-03-09 | Disposition: A | Discharge status: Home / Self Care | Payer: Medicare Other | Source: Ambulatory Visit | Attending: Physician Assistant | Admitting: Physician Assistant

## 2021-03-09 ENCOUNTER — Other Ambulatory Visit: Payer: Self-pay

## 2021-03-09 DIAGNOSIS — G893 Neoplasm related pain (acute) (chronic): Secondary | ICD-10-CM | POA: Insufficient documentation

## 2021-03-09 DIAGNOSIS — Z923 Personal history of irradiation: Secondary | ICD-10-CM | POA: Insufficient documentation

## 2021-03-09 DIAGNOSIS — C3490 Malignant neoplasm of unspecified part of unspecified bronchus or lung: Secondary | ICD-10-CM | POA: Diagnosis not present

## 2021-03-09 DIAGNOSIS — Z79899 Other long term (current) drug therapy: Secondary | ICD-10-CM | POA: Insufficient documentation

## 2021-03-09 DIAGNOSIS — Z79891 Long term (current) use of opiate analgesic: Secondary | ICD-10-CM | POA: Insufficient documentation

## 2021-03-09 DIAGNOSIS — J432 Centrilobular emphysema: Secondary | ICD-10-CM | POA: Diagnosis not present

## 2021-03-09 DIAGNOSIS — M797 Fibromyalgia: Secondary | ICD-10-CM | POA: Insufficient documentation

## 2021-03-09 DIAGNOSIS — Z95828 Presence of other vascular implants and grafts: Secondary | ICD-10-CM

## 2021-03-09 DIAGNOSIS — C3411 Malignant neoplasm of upper lobe, right bronchus or lung: Secondary | ICD-10-CM

## 2021-03-09 DIAGNOSIS — I10 Essential (primary) hypertension: Secondary | ICD-10-CM | POA: Insufficient documentation

## 2021-03-09 DIAGNOSIS — R918 Other nonspecific abnormal finding of lung field: Secondary | ICD-10-CM | POA: Diagnosis not present

## 2021-03-09 DIAGNOSIS — E119 Type 2 diabetes mellitus without complications: Secondary | ICD-10-CM | POA: Insufficient documentation

## 2021-03-09 DIAGNOSIS — I700 Atherosclerosis of aorta: Secondary | ICD-10-CM | POA: Diagnosis not present

## 2021-03-09 DIAGNOSIS — J984 Other disorders of lung: Secondary | ICD-10-CM | POA: Diagnosis not present

## 2021-03-09 DIAGNOSIS — C3410 Malignant neoplasm of upper lobe, unspecified bronchus or lung: Secondary | ICD-10-CM | POA: Insufficient documentation

## 2021-03-09 DIAGNOSIS — C7931 Secondary malignant neoplasm of brain: Secondary | ICD-10-CM | POA: Insufficient documentation

## 2021-03-09 DIAGNOSIS — Z7982 Long term (current) use of aspirin: Secondary | ICD-10-CM | POA: Insufficient documentation

## 2021-03-09 DIAGNOSIS — Z8673 Personal history of transient ischemic attack (TIA), and cerebral infarction without residual deficits: Secondary | ICD-10-CM | POA: Insufficient documentation

## 2021-03-09 DIAGNOSIS — Z9221 Personal history of antineoplastic chemotherapy: Secondary | ICD-10-CM | POA: Insufficient documentation

## 2021-03-09 LAB — CBC WITH DIFFERENTIAL/PLATELET
Abs Immature Granulocytes: 0.05 10*3/uL (ref 0.00–0.07)
Basophils Absolute: 0.1 10*3/uL (ref 0.0–0.1)
Basophils Relative: 1 %
Eosinophils Absolute: 0.1 10*3/uL (ref 0.0–0.5)
Eosinophils Relative: 1 %
HCT: 41.8 % (ref 36.0–46.0)
Hemoglobin: 13.6 g/dL (ref 12.0–15.0)
Immature Granulocytes: 0 %
Lymphocytes Relative: 25 %
Lymphs Abs: 4.1 10*3/uL — ABNORMAL HIGH (ref 0.7–4.0)
MCH: 30.4 pg (ref 26.0–34.0)
MCHC: 32.5 g/dL (ref 30.0–36.0)
MCV: 93.5 fL (ref 80.0–100.0)
Monocytes Absolute: 1.6 10*3/uL — ABNORMAL HIGH (ref 0.1–1.0)
Monocytes Relative: 10 %
Neutro Abs: 10.3 10*3/uL — ABNORMAL HIGH (ref 1.7–7.7)
Neutrophils Relative %: 63 %
Platelets: 387 10*3/uL (ref 150–400)
RBC: 4.47 MIL/uL (ref 3.87–5.11)
RDW: 13.6 % (ref 11.5–15.5)
WBC: 16.3 10*3/uL — ABNORMAL HIGH (ref 4.0–10.5)
nRBC: 0 % (ref 0.0–0.2)

## 2021-03-09 LAB — COMPREHENSIVE METABOLIC PANEL
ALT: 13 U/L (ref 0–44)
AST: 20 U/L (ref 15–41)
Albumin: 4 g/dL (ref 3.5–5.0)
Alkaline Phosphatase: 74 U/L (ref 38–126)
Anion gap: 7 (ref 5–15)
BUN: 28 mg/dL — ABNORMAL HIGH (ref 8–23)
CO2: 31 mmol/L (ref 22–32)
Calcium: 9.8 mg/dL (ref 8.9–10.3)
Chloride: 100 mmol/L (ref 98–111)
Creatinine, Ser: 1.28 mg/dL — ABNORMAL HIGH (ref 0.44–1.00)
GFR, Estimated: 46 mL/min — ABNORMAL LOW (ref >60)
Glucose, Bld: 120 mg/dL — ABNORMAL HIGH (ref 70–99)
Potassium: 3.8 mmol/L (ref 3.5–5.1)
Sodium: 138 mmol/L (ref 135–145)
Total Bilirubin: 0.5 mg/dL (ref 0.3–1.2)
Total Protein: 7.3 g/dL (ref 6.5–8.1)

## 2021-03-09 MED ORDER — SODIUM CHLORIDE 0.9% FLUSH
10.0000 mL | INTRAVENOUS | Status: DC | PRN
  Administered 2021-03-09: 10 mL via INTRAVENOUS

## 2021-03-09 MED ORDER — HEPARIN SOD (PORK) LOCK FLUSH 100 UNIT/ML IV SOLN
500.0000 [IU] | Freq: Once | INTRAVENOUS | Status: AC
  Administered 2021-03-09: 500 [IU] via INTRAVENOUS

## 2021-03-09 MED ORDER — IOHEXOL 300 MG/ML  SOLN
100.0000 mL | Freq: Once | INTRAMUSCULAR | Status: AC | PRN
  Administered 2021-03-09: 100 mL via INTRAVENOUS

## 2021-03-13 ENCOUNTER — Other Ambulatory Visit: Payer: Medicare Other

## 2021-03-13 ENCOUNTER — Ambulatory Visit (INDEPENDENT_AMBULATORY_CARE_PROVIDER_SITE_OTHER): Payer: Medicare Other | Admitting: Family Medicine

## 2021-03-13 ENCOUNTER — Encounter: Payer: Self-pay | Admitting: Family Medicine

## 2021-03-13 ENCOUNTER — Inpatient Hospital Stay (HOSPITAL_BASED_OUTPATIENT_CLINIC_OR_DEPARTMENT_OTHER): Payer: Medicare Other | Admitting: Internal Medicine

## 2021-03-13 ENCOUNTER — Other Ambulatory Visit: Payer: Self-pay

## 2021-03-13 ENCOUNTER — Inpatient Hospital Stay: Payer: Medicare Other

## 2021-03-13 VITALS — BP 140/60 | HR 86 | Temp 97.9°F | Ht 62.75 in | Wt 151.1 lb

## 2021-03-13 VITALS — BP 128/49 | HR 80 | Temp 98.1°F | Resp 16 | Wt 152.3 lb

## 2021-03-13 DIAGNOSIS — C7931 Secondary malignant neoplasm of brain: Secondary | ICD-10-CM | POA: Diagnosis not present

## 2021-03-13 DIAGNOSIS — E119 Type 2 diabetes mellitus without complications: Secondary | ICD-10-CM

## 2021-03-13 DIAGNOSIS — C3490 Malignant neoplasm of unspecified part of unspecified bronchus or lung: Secondary | ICD-10-CM

## 2021-03-13 DIAGNOSIS — I152 Hypertension secondary to endocrine disorders: Secondary | ICD-10-CM | POA: Diagnosis not present

## 2021-03-13 DIAGNOSIS — Z7982 Long term (current) use of aspirin: Secondary | ICD-10-CM | POA: Diagnosis not present

## 2021-03-13 DIAGNOSIS — Z8673 Personal history of transient ischemic attack (TIA), and cerebral infarction without residual deficits: Secondary | ICD-10-CM | POA: Diagnosis not present

## 2021-03-13 DIAGNOSIS — C3410 Malignant neoplasm of upper lobe, unspecified bronchus or lung: Secondary | ICD-10-CM | POA: Diagnosis not present

## 2021-03-13 DIAGNOSIS — Z9221 Personal history of antineoplastic chemotherapy: Secondary | ICD-10-CM | POA: Diagnosis not present

## 2021-03-13 DIAGNOSIS — Z79891 Long term (current) use of opiate analgesic: Secondary | ICD-10-CM | POA: Diagnosis not present

## 2021-03-13 DIAGNOSIS — Z79899 Other long term (current) drug therapy: Secondary | ICD-10-CM | POA: Diagnosis not present

## 2021-03-13 DIAGNOSIS — M797 Fibromyalgia: Secondary | ICD-10-CM | POA: Diagnosis not present

## 2021-03-13 DIAGNOSIS — E1159 Type 2 diabetes mellitus with other circulatory complications: Secondary | ICD-10-CM | POA: Diagnosis not present

## 2021-03-13 DIAGNOSIS — Z923 Personal history of irradiation: Secondary | ICD-10-CM | POA: Diagnosis not present

## 2021-03-13 DIAGNOSIS — G893 Neoplasm related pain (acute) (chronic): Secondary | ICD-10-CM | POA: Diagnosis not present

## 2021-03-13 DIAGNOSIS — I10 Essential (primary) hypertension: Secondary | ICD-10-CM | POA: Diagnosis not present

## 2021-03-13 LAB — POCT GLYCOSYLATED HEMOGLOBIN (HGB A1C): Hemoglobin A1C: 5.2 % (ref 4.0–5.6)

## 2021-03-13 NOTE — Assessment & Plan Note (Signed)
Followed closely by oncology.  Has appointment today to review recent scan and consider restarting chemotherapy since her viral gastroenteritis has resolved ?

## 2021-03-13 NOTE — Assessment & Plan Note (Signed)
Stable, chronic.  Continue current medication. ? ? ?Amlodipine 10 mg daily ?Lasix 20 mg daily as needed swelling ?Lisinopril 20 mg daily ?

## 2021-03-13 NOTE — Patient Instructions (Signed)
Keep up healthy eating and exercise as able. ?

## 2021-03-13 NOTE — Assessment & Plan Note (Signed)
Secondary to prednisone ? Stable chronic, diet controlled. ? ?

## 2021-03-13 NOTE — Progress Notes (Signed)
[place] [phone]   [phone]  OFFICE PROGRESS NOTE  [name], MD [place] [address]  DIAGNOSIS: Metastatic non-small cell lung cancer initially diagnosed as locally advanced stage IIIB with a right Pancoast tumor involving the vertebral body as well as prominent canal invasion with spinal cord compression in October 2007. The patient also has metastatic disease to the brain in April 2008.  PRIOR THERAPY: Status post concurrent chemoradiation with weekly carboplatin and paclitaxel, last dose was given November 18, 2005. 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 Status post gamma knife radiotherapy to a solitary brain lesion located in the superior frontal area of the brain at [facility] in April of 2008. Status post palliative radiotherapy to the lateral abdominal wall metastatic lesion under the care of Dr. Lisbeth Renshaw, completed March of 2009. Status post 6 cycles of systemic chemotherapy with carboplatin and Alimta. Last dose was given July 26, 2007 with disease stabilization. Gamma knife stereotactic radiotherapy to 2 brain lesions one involving the right frontal dural based as well as right parietal lesion performed on 05/07/2012 under the care of Dr. Vallarie Mare at [facility].  CURRENT THERAPY: Maintenance systemic chemotherapy with Alimta 500 MG/M2 every 3 weeks, status post 233 cycles.  Treatment is currently on hold since January 30, 2021  INTERVAL HISTORY: Danielle [name] 71 y.o. female returns to the clinic today for follow-up visit.  The patient is feeling fine today with no concerning complaints.  She has been in observation for the last 2 months and she is feeling fine with no concerning issues.  Her nausea is better.  She has less fatigue than before.  She denied having any current chest pain, shortness of breath, cough  or hemoptysis.  She has no nausea, vomiting, diarrhea or constipation.  She had chest congestion and flulike symptoms last week secondary to sick grandchildren.  The patient is here today for evaluation with repeat CT scan of the chest, abdomen and pelvis for restaging of her disease.  MEDICAL HISTORY: Past Medical History:  Diagnosis Date   Anemia    Antineoplastic chemotherapy induced anemia 01/23/2016   Carcinomas, basal cell 01/22/2019   two places on face   CVA (cerebral vascular accident) ([place]) 04/2014   pt states had 2 cva's within 2 wks   Diabetes mellitus without complication ([place])    DJD (degenerative joint disease), cervical    Fibromyalgia    H/O: pneumonia    History of tobacco abuse quit 10/08   on nicotine patch   Hypertension    Hypokalemia    Lung cancer ([place]) dx'd 09/2005   hx of non-small cell: metastasis to brain. had chemo and radiation for lung ca   Thrush 12/11/2010    ALLERGIES:  is allergic to contrast media [iodinated contrast media], iohexol, sulfa drugs cross reactors, and co-trimoxazole injection [sulfamethoxazole-trimethoprim].  MEDICATIONS:  Current Outpatient Medications  Medication Sig Dispense Refill   albuterol (VENTOLIN HFA) 108 (90 Base) MCG/ACT inhaler Sig: TAKE 2 PUFFS BY MOUTH EVERY 6 HOURS AS NEEDED FOR WHEEZE OR SHORTNESS OF BREATH 18 each 5   alendronate (FOSAMAX) 70 MG tablet TAKE 1 TABLET BY MOUTH EVERY 7 (SEVEN) DAYS. TAKE WITH A FULL GLASS OF WATER ON AN EMPTY STOMACH. 12 tablet 1   amLODipine (NORVASC) 10 MG tablet TAKE 1 TABLET BY MOUTH EVERY DAY 90 tablet 1   aspirin 325  MG EC tablet TAKE 1 TABLET BY MOUTH EVERY DAY 90 tablet 0   atorvastatin (LIPITOR) 40 MG tablet TAKE 1 TABLET BY MOUTH EVERY DAY AT 6 PM 90 tablet 2   Cholecalciferol (VITAMIN D3) 50 MCG (2000 UT) TABS Take by mouth.     diphenhydrAMINE (BENADRYL) 25 MG tablet Take 1 tablet (25 mg total) by mouth as directed. 2 hours before CT scan. 5 tablet 0   FeFum-FePoly-FA-B  Cmp-C-Biot (FOLIVANE-PLUS) CAPS TAKE 1 CAPSULE BY MOUTH EVERY DAY IN THE MORNING 90 capsule 3   fluticasone (FLONASE) 50 MCG/ACT nasal spray Place 1-2 sprays into both nostrils daily as needed. 16 g 5   folic acid (FOLVITE) 1 MG tablet TAKE 1 TABLET BY MOUTH EVERY DAY 90 tablet 1   furosemide (LASIX) 20 MG tablet TAKE 1 TABLET BY MOUTH EVERY DAY AS NEEDED FOR SWELLING 20 tablet 1   INCRUSE ELLIPTA 62.5 MCG/ACT AEPB INHALE 1 PUFF BY MOUTH EVERY DAY 30 each 5   lidocaine-prilocaine (EMLA) cream Apply to the Port-A-Cath site 30-60-minute before treatment. 30 g 0   lisinopril (ZESTRIL) 20 MG tablet TAKE 2 TABLETS (40 MG TOTAL) BY MOUTH DAILY. 180 tablet 0   morphine (MS CONTIN) 60 MG 12 hr tablet Take 1 tablet (60 mg total) by mouth 2 (two) times daily. 60 tablet 0   morphine (MSIR) 30 MG tablet Take 1 tablet (30 mg total) by mouth every 4 (four) hours as needed (breakthrough pain). 60 tablet 0   Olopatadine HCl 0.2 % SOLN INSTILL 1 DROP INTO AFFECTED EYE(S) EVERY DAY 2.5 mL 1   ondansetron (ZOFRAN) 8 MG tablet TAKE 1 TABLET BY MOUTH EVERY 8 HOURS AS NEEDED FOR NAUSEA AND VOMITING 20 tablet 1   predniSONE (DELTASONE) 50 MG tablet 1 tablet by mouth 13 hours ,7 hours and 1 hour  before CT scan 3 tablet 4   prochlorperazine (COMPAZINE) 10 MG tablet Take 1 tablet (10 mg total) by mouth every 6 (six) hours as needed. 30 tablet 2   triamcinolone (KENALOG) 0.025 % ointment APPLY TO AFFECTED AREA TWICE A DAY 30 g 0   No current facility-administered medications for this visit.    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   LOOP RECORDER IMPLANT N/A 04/19/2014   Procedure: LOOP RECORDER IMPLANT;  Surgeon: Thompson Grayer, MD;  Location: [facility] CATH LAB;  Service: Cardiovascular;  Laterality: N/A;   OTHER SURGICAL HISTORY  2008   Gamma knife surgery to  remove brain met    PORTACATH PLACEMENT  -[name]   TIP IN CAVOATRIAL JUNCTION   TEE WITHOUT CARDIOVERSION N/A 04/19/2014   Procedure: TRANSESOPHAGEAL ECHOCARDIOGRAM (TEE);  Surgeon: Larey Dresser, MD;  Location: [facility];  Service: Cardiovascular;  Laterality: N/A;    REVIEW OF SYSTEMS:  A comprehensive review of systems was negative except for: Constitutional: positive for fatigue   PHYSICAL EXAMINATION: General appearance: alert, cooperative, and no distress Head: Normocephalic, without obvious abnormality, atraumatic Neck: no adenopathy, no JVD, supple, symmetrical, trachea midline, and thyroid not enlarged, symmetric, no tenderness/mass/nodules Lymph nodes: Cervical, supraclavicular, and axillary nodes normal. Resp: clear to auscultation bilaterally Back: symmetric, no curvature. ROM normal. No CVA tenderness. Cardio: regular rate and rhythm, S1, S2 normal, no murmur, click, rub or gallop GI: soft, non-tender; bowel sounds  normal; no masses,  no organomegaly Extremities: extremities normal, atraumatic, no cyanosis or edema  ECOG PERFORMANCE STATUS: 1 - Symptomatic but completely ambulatory  Blood pressure (!) 128/49, pulse 80, temperature 98.1 F (36.7 C), temperature source Tympanic, resp. rate 16, weight 152 lb 5 oz (69.1 kg), SpO2 96 %.  LABORATORY DATA: Lab Results  Component Value Date   WBC 16.3 (H) 03/09/2021   HGB 13.6 03/09/2021   HCT 41.8 03/09/2021   MCV 93.5 03/09/2021   PLT 387 03/09/2021      Chemistry      Component Value Date/Time   NA 138 03/09/2021 1211   NA 142 12/24/2016 0816   K 3.8 03/09/2021 1211   K 4.4 12/24/2016 0816   CL 100 03/09/2021 1211   CL 106 06/30/2012 1004   CO2 31 03/09/2021 1211   CO2 28 12/24/2016 0816   BUN 28 (H) 03/09/2021 1211   BUN 15.8 12/24/2016 0816   CREATININE 1.28 (H) 03/09/2021 1211   CREATININE 0.97 10/18/2020 1323   CREATININE 1.1 12/24/2016 0816      Component Value Date/Time   CALCIUM 9.8 03/09/2021  1211   CALCIUM 9.6 12/24/2016 0816   ALKPHOS 74 03/09/2021 1211   ALKPHOS 80 12/24/2016 0816   AST 20 03/09/2021 1211   AST 21 10/18/2020 1323   AST 20 12/24/2016 0816   ALT 13 03/09/2021 1211   ALT 13 10/18/2020 1323   ALT 11 12/24/2016 0816   BILITOT 0.5 03/09/2021 1211   BILITOT 0.3 10/18/2020 1323   BILITOT 0.34 12/24/2016 0816       RADIOGRAPHIC STUDIES: CT Chest W Contrast  Result Date: 03/10/2021 CLINICAL DATA:  71 year old female with history of non-small cell lung cancer. Evaluate for metastatic disease. * onc * EXAM: CT CHEST, ABDOMEN, AND PELVIS WITH CONTRAST TECHNIQUE: Multidetector CT imaging of the chest, abdomen and pelvis was performed following the standard protocol during bolus administration of intravenous contrast. RADIATION DOSE REDUCTION: This exam was performed according to the departmental dose-optimization program which includes automated exposure control, adjustment of the mA and/or kV according to patient size and/or use of iterative reconstruction technique. CONTRAST:  176m OMNIPAQUE IOHEXOL 300 MG/ML  SOLN COMPARISON:  CT of the chest, abdomen and pelvis 11/03/2020. FINDINGS: CT CHEST FINDINGS Cardiovascular: Heart size is normal. There is no significant pericardial fluid, thickening or pericardial calcification. There is aortic atherosclerosis, as well as atherosclerosis of the great vessels of the mediastinum and the coronary arteries, including calcified atherosclerotic plaque in the left main, left anterior descending, left circumflex and right coronary arteries. Right internal jugular single-lumen porta cath with tip terminating in the right atrium. Mediastinum/Nodes: No pathologically enlarged mediastinal or hilar lymph nodes. Esophagus is unremarkable in appearance. No axillary lymphadenopathy. Lungs/Pleura: Chronic postradiation changes are again noted in the medial aspect of the right upper lobe near the apex. New nodular area of architectural distortion in  the medial aspect of the left upper lobe (axial image 45 of series 6) measuring 1.6 x 1.0 cm. No acute consolidative airspace disease. No pleural effusions. Diffuse bronchial wall thickening with moderate centrilobular and paraseptal emphysema. Stable chronic mild right pleural thickening noted. Musculoskeletal: Chronic compression fracture of T3 with vertebral plana appearance again noted. Post vertebroplasty changes are again noted at T4 and T7. There are no aggressive appearing lytic or blastic lesions noted in the visualized portions of the skeleton. CT ABDOMEN PELVIS FINDINGS Hepatobiliary: No suspicious cystic or solid hepatic lesions. No intra or extrahepatic biliary ductal dilatation.  Gallbladder is normal in appearance. Pancreas: No pancreatic mass. No pancreatic ductal dilatation. No pancreatic or peripancreatic fluid collections or inflammatory changes. Spleen: Unremarkable. Adrenals/Urinary Tract: Subcentimeter low-attenuation lesions in both kidneys, too small to characterize, but statistically likely to represent tiny cysts. Bilateral adrenal glands are normal in appearance. No hydroureteronephrosis. Urinary bladder is normal in appearance. Stomach/Bowel: Normal appearance of the stomach. No pathologic dilatation of small bowel or colon. Normal appendix. Vascular/Lymphatic: Aortic atherosclerosis, without evidence of aneurysm or dissection in the abdominal or pelvic vasculature. No lymphadenopathy noted in the abdomen or pelvis. Reproductive: Uterus and ovaries are unremarkable in appearance. Other: No significant volume of ascites.  No pneumoperitoneum. Musculoskeletal: There are no aggressive appearing lytic or blastic lesions noted in the visualized portions of the skeleton. IMPRESSION: 1. New ill-defined nodular area of architectural distortion in the left upper lobe measuring 1.6 x 1.0 cm. This could be of infectious or inflammatory etiology, however, neoplasm is not excluded. Close attention on  short-term follow-up chest CT is recommended in the next 2-3 months to ensure regression. 2. Otherwise, stable examination with postradiation changes in the medial aspect of the right upper lobe near the apex and no definitive findings to suggest metastatic disease to the abdomen or pelvis. 3. Diffuse bronchial wall thickening with moderate centrilobular and paraseptal emphysema; imaging findings suggestive of underlying COPD. 4. Aortic atherosclerosis, in addition to left main and three-vessel coronary artery disease. Please note that although the presence of coronary artery calcium documents the presence of coronary artery disease, the severity of this disease and any potential stenosis cannot be assessed on this non-gated CT examination. Assessment for potential risk factor modification, dietary therapy or pharmacologic therapy may be warranted, if clinically indicated. 5. Additional incidental findings, as above. Electronically Signed   By: Vinnie Langton M.D.   On: 03/10/2021 11:55   CT Abdomen Pelvis W Contrast  Result Date: 03/10/2021 CLINICAL DATA:  71 year old female with history of non-small cell lung cancer. Evaluate for metastatic disease. * onc * EXAM: CT CHEST, ABDOMEN, AND PELVIS WITH CONTRAST TECHNIQUE: Multidetector CT imaging of the chest, abdomen and pelvis was performed following the standard protocol during bolus administration of intravenous contrast. RADIATION DOSE REDUCTION: This exam was performed according to the departmental dose-optimization program which includes automated exposure control, adjustment of the mA and/or kV according to patient size and/or use of iterative reconstruction technique. CONTRAST:  17m OMNIPAQUE IOHEXOL 300 MG/ML  SOLN COMPARISON:  CT of the chest, abdomen and pelvis 11/03/2020. FINDINGS: CT CHEST FINDINGS Cardiovascular: Heart size is normal. There is no significant pericardial fluid, thickening or pericardial calcification. There is aortic  atherosclerosis, as well as atherosclerosis of the great vessels of the mediastinum and the coronary arteries, including calcified atherosclerotic plaque in the left main, left anterior descending, left circumflex and right coronary arteries. Right internal jugular single-lumen porta cath with tip terminating in the right atrium. Mediastinum/Nodes: No pathologically enlarged mediastinal or hilar lymph nodes. Esophagus is unremarkable in appearance. No axillary lymphadenopathy. Lungs/Pleura: Chronic postradiation changes are again noted in the medial aspect of the right upper lobe near the apex. New nodular area of architectural distortion in the medial aspect of the left upper lobe (axial image 45 of series 6) measuring 1.6 x 1.0 cm. No acute consolidative airspace disease. No pleural effusions. Diffuse bronchial wall thickening with moderate centrilobular and paraseptal emphysema. Stable chronic mild right pleural thickening noted. Musculoskeletal: Chronic compression fracture of T3 with vertebral plana appearance again noted. Post vertebroplasty changes are  again noted at T4 and T7. There are no aggressive appearing lytic or blastic lesions noted in the visualized portions of the skeleton. CT ABDOMEN PELVIS FINDINGS Hepatobiliary: No suspicious cystic or solid hepatic lesions. No intra or extrahepatic biliary ductal dilatation. Gallbladder is normal in appearance. Pancreas: No pancreatic mass. No pancreatic ductal dilatation. No pancreatic or peripancreatic fluid collections or inflammatory changes. Spleen: Unremarkable. Adrenals/Urinary Tract: Subcentimeter low-attenuation lesions in both kidneys, too small to characterize, but statistically likely to represent tiny cysts. Bilateral adrenal glands are normal in appearance. No hydroureteronephrosis. Urinary bladder is normal in appearance. Stomach/Bowel: Normal appearance of the stomach. No pathologic dilatation of small bowel or colon. Normal appendix.  Vascular/Lymphatic: Aortic atherosclerosis, without evidence of aneurysm or dissection in the abdominal or pelvic vasculature. No lymphadenopathy noted in the abdomen or pelvis. Reproductive: Uterus and ovaries are unremarkable in appearance. Other: No significant volume of ascites.  No pneumoperitoneum. Musculoskeletal: There are no aggressive appearing lytic or blastic lesions noted in the visualized portions of the skeleton. IMPRESSION: 1. New ill-defined nodular area of architectural distortion in the left upper lobe measuring 1.6 x 1.0 cm. This could be of infectious or inflammatory etiology, however, neoplasm is not excluded. Close attention on short-term follow-up chest CT is recommended in the next 2-3 months to ensure regression. 2. Otherwise, stable examination with postradiation changes in the medial aspect of the right upper lobe near the apex and no definitive findings to suggest metastatic disease to the abdomen or pelvis. 3. Diffuse bronchial wall thickening with moderate centrilobular and paraseptal emphysema; imaging findings suggestive of underlying COPD. 4. Aortic atherosclerosis, in addition to left main and three-vessel coronary artery disease. Please note that although the presence of coronary artery calcium documents the presence of coronary artery disease, the severity of this disease and any potential stenosis cannot be assessed on this non-gated CT examination. Assessment for potential risk factor modification, dietary therapy or pharmacologic therapy may be warranted, if clinically indicated. 5. Additional incidental findings, as above. Electronically Signed   By: Vinnie Langton M.D.   On: 03/10/2021 11:55    ASSESSMENT AND PLAN: This is a very pleasant 71 years old white female with metastatic non-small cell lung cancer diagnosed in October 2007 status post concurrent chemoradiation followed by consolidation chemotherapy and she is currently on maintenance treatment with single agent  Alimta status post 233 cycles. The patient has been tolerating this treatment well but recently she started having more nausea as well as fatigue. Her treatment is currently on hold and she has been on observation for the last 2 months and feeling well. She had repeat CT scan of the chest, abdomen pelvis performed recently.  I personally and independently reviewed the scan images and discussed the results with the patient today and showed her the images. Her scan showed no concerning findings for disease progression but there was new ill-defined nodular area in the left upper lobe likely infectious/inflammatory in etiology especially with her recent cold symptoms but we will continue to monitor it closely on the upcoming imaging studies. I recommended for the patient to continue on observation with repeat CT scan of the chest, abdomen pelvis in 3 months. For the pain management, I will give her a refill of MS Contin today.  She will also continue on morphine sulfate on as-needed basis. The patient was advised to call immediately if she has any other concerning symptoms in the interval. The patient voices understanding of current disease status and treatment options and is  in agreement with the current care plan. All questions were answered. The patient knows to call the clinic with any problems, questions or concerns. We can certainly see the patient much sooner if necessary. The total time spent in the appointment was 30 minutes.  Disclaimer: This note was dictated with voice recognition software. Similar sounding words can inadvertently be transcribed and may not be corrected upon review.

## 2021-03-13 NOTE — Progress Notes (Signed)
Patient ID: Danielle [name], female    DOB: 12-26-1950, 71 y.o.   MRN: [id number]  This visit was conducted in person.  BP 140/60    Pulse 86    Temp 97.9 F (36.6 C) (Oral)    Ht 5' 2.75" (1.594 m)    Wt 151 lb 2 oz (68.5 kg)    SpO2 92%    BMI 26.98 kg/m    CC:  Chief Complaint  Patient presents with   Diabetes    Subjective:   HPI: Danielle [name] is a 71 y.o. female presenting on 03/13/2021 for Diabetes    She has recently gotten over  viral gastroenteritis.  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  Followed closely by oncology She is on chemo number  232 treatment..  had scan results after holding 3 visits... may need to continue chemo today.    Diabetes: Well controlled despite chronic prednisone use. Lab Results  Component Value Date   HGBA1C 5.2 03/13/2021  Using medications without difficulties: Hypoglycemic episodes: None Hyperglycemic episodes: None Feet problems: None Blood Sugars averaging: At goal eye exam within last year: Yes  Wt Readings from Last 3 Encounters:  03/13/21 151 lb 2 oz (68.5 kg)  01/09/21 147 lb 12.8 oz (67 kg)  12/19/20 148 lb 4.8 oz (67.3 kg)   Hypertension:  At goal on current regimen BP Readings from Last 3 Encounters:  03/13/21 140/60  01/09/21 132/69  12/19/20 (!) 155/63  Using medication without problems or lightheadedness:  none Chest pain with exertion: one Edema:none Short of breath: stable. Average home BPs: Other issues:   Relevant past medical, surgical, family and social history reviewed and updated as indicated. Interim medical history since our last visit reviewed. Allergies and medications reviewed and updated. Outpatient Medications Prior to Visit  Medication Sig Dispense Refill   albuterol (VENTOLIN HFA) 108 (90 Base) MCG/ACT inhaler Sig: TAKE 2 PUFFS BY MOUTH EVERY 6 HOURS AS NEEDED FOR WHEEZE OR SHORTNESS OF BREATH 18 each 5   alendronate (FOSAMAX) 70 MG tablet TAKE 1 TABLET BY MOUTH EVERY 7  (SEVEN) DAYS. TAKE WITH A FULL GLASS OF WATER ON AN EMPTY STOMACH. 12 tablet 1   amLODipine (NORVASC) 10 MG tablet TAKE 1 TABLET BY MOUTH EVERY DAY 90 tablet 1   aspirin 325 MG EC tablet TAKE 1 TABLET BY MOUTH EVERY DAY 90 tablet 0   atorvastatin (LIPITOR) 40 MG tablet TAKE 1 TABLET BY MOUTH EVERY DAY AT 6 PM 90 tablet 2   Cholecalciferol (VITAMIN D3) 50 MCG (2000 UT) TABS Take by mouth.     diphenhydrAMINE (BENADRYL) 25 MG tablet Take 1 tablet (25 mg total) by mouth as directed. 2 hours before CT scan. 5 tablet 0   FeFum-FePoly-FA-B Cmp-C-Biot (FOLIVANE-PLUS) CAPS TAKE 1 CAPSULE BY MOUTH EVERY DAY IN THE MORNING 90 capsule 3   fluticasone (FLONASE) 50 MCG/ACT nasal spray Place 1-2 sprays into both nostrils daily as needed. 16 g 5   folic acid (FOLVITE) 1 MG tablet TAKE 1 TABLET BY MOUTH EVERY DAY 90 tablet 1   furosemide (LASIX) 20 MG tablet TAKE 1 TABLET BY MOUTH EVERY DAY AS NEEDED FOR SWELLING 20 tablet 1   INCRUSE ELLIPTA 62.5 MCG/ACT AEPB INHALE 1 PUFF BY MOUTH EVERY DAY 30 each 5   lidocaine-prilocaine (EMLA) cream Apply to the Port-A-Cath site 30-60-minute before treatment. 30 g 0   lisinopril (ZESTRIL) 20 MG tablet TAKE 2 TABLETS (40 MG TOTAL) BY MOUTH  DAILY. 180 tablet 0   morphine (MS CONTIN) 60 MG 12 hr tablet Take 1 tablet (60 mg total) by mouth 2 (two) times daily. 60 tablet 0   morphine (MSIR) 30 MG tablet Take 1 tablet (30 mg total) by mouth every 4 (four) hours as needed (breakthrough pain). 60 tablet 0   Olopatadine HCl 0.2 % SOLN INSTILL 1 DROP INTO AFFECTED EYE(S) EVERY DAY 2.5 mL 1   ondansetron (ZOFRAN) 8 MG tablet TAKE 1 TABLET BY MOUTH EVERY 8 HOURS AS NEEDED FOR NAUSEA AND VOMITING 20 tablet 1   predniSONE (DELTASONE) 50 MG tablet 1 tablet by mouth 13 hours ,7 hours and 1 hour  before CT scan 3 tablet 4   prochlorperazine (COMPAZINE) 10 MG tablet Take 1 tablet (10 mg total) by mouth every 6 (six) hours as needed. 30 tablet 2   triamcinolone (KENALOG) 0.025 % ointment APPLY  TO AFFECTED AREA TWICE A DAY 30 g 0   No facility-administered medications prior to visit.     Per HPI unless specifically indicated in ROS section below Review of Systems  Constitutional:  Negative for fatigue and fever.  HENT:  Negative for congestion.   Eyes:  Negative for pain.  Respiratory:  Negative for cough and shortness of breath.   Cardiovascular:  Negative for chest pain, palpitations and leg swelling.  Gastrointestinal:  Negative for abdominal pain.  Genitourinary:  Negative for dysuria and vaginal bleeding.  Musculoskeletal:  Negative for back pain.  Neurological:  Negative for syncope, light-headedness and headaches.  Psychiatric/Behavioral:  Negative for dysphoric mood.   Objective:  BP 140/60    Pulse 86    Temp 97.9 F (36.6 C) (Oral)    Ht 5' 2.75" (1.594 m)    Wt 151 lb 2 oz (68.5 kg)    SpO2 92%    BMI 26.98 kg/m   Wt Readings from Last 3 Encounters:  03/13/21 151 lb 2 oz (68.5 kg)  01/09/21 147 lb 12.8 oz (67 kg)  12/19/20 148 lb 4.8 oz (67.3 kg)      Physical Exam Constitutional:      General: She is not in acute distress.    Appearance: Normal appearance. She is well-developed. She is not ill-appearing or toxic-appearing.  HENT:     Head: Normocephalic.     Right Ear: Hearing, tympanic membrane, ear canal and external ear normal. Tympanic membrane is not erythematous, retracted or bulging.     Left Ear: Hearing, tympanic membrane, ear canal and external ear normal. Tympanic membrane is not erythematous, retracted or bulging.     Nose: No mucosal edema or rhinorrhea.     Right Sinus: No maxillary sinus tenderness or frontal sinus tenderness.     Left Sinus: No maxillary sinus tenderness or frontal sinus tenderness.     Mouth/Throat:     Pharynx: Uvula midline.  Eyes:     General: Lids are normal. Lids are everted, no foreign bodies appreciated.     Conjunctiva/sclera: Conjunctivae normal.     Pupils: Pupils are equal, round, and reactive to light.   Neck:     Thyroid: No thyroid mass or thyromegaly.     Vascular: No carotid bruit.     Trachea: Trachea normal.  Cardiovascular:     Rate and Rhythm: Normal rate and regular rhythm.     Pulses: Normal pulses.     Heart sounds: Normal heart sounds, S1 normal and S2 normal. No murmur heard.   No friction rub. No gallop.  Pulmonary:     Effort: Pulmonary effort is normal. No tachypnea or respiratory distress.     Breath sounds: Decreased breath sounds present. No wheezing, rhonchi or rales.  Abdominal:     General: Bowel sounds are normal.     Palpations: Abdomen is soft.     Tenderness: There is no abdominal tenderness.  Musculoskeletal:     Cervical back: Normal range of motion and neck supple.  Skin:    General: Skin is warm and dry.     Findings: No rash.  Neurological:     Mental Status: She is alert.  Psychiatric:        Mood and Affect: Mood is not anxious or depressed.        Speech: Speech normal.        Behavior: Behavior normal. Behavior is cooperative.        Thought Content: Thought content normal.        Judgment: Judgment normal.  Amlodipine 10 mg daily Lasix 20 mg daily as needed for swelling    Results for orders placed or performed in visit on 03/13/21  POCT glycosylated hemoglobin (Hb A1C)  Result Value Ref Range   Hemoglobin A1C 5.2 4.0 - 5.6 %   HbA1c POC (<> result, manual entry)     HbA1c, POC (prediabetic range)     HbA1c, POC (controlled diabetic range)     *Note: Due to a large number of results and/or encounters for the requested time period, some results have not been displayed. A complete set of results can be found in Results Review.    This visit occurred during the SARS-CoV-2 public health emergency.  Safety protocols were in place, including screening questions prior to the visit, additional usage of staff PPE, and extensive cleaning of exam room while observing appropriate contact time as indicated for disinfecting solutions.   COVID 19  screen:  No recent travel or known exposure to COVID19 The patient denies respiratory symptoms of COVID 19 at this time. The importance of social distancing was discussed today.   Assessment and Plan    Problem List Items Addressed This Visit     Diabetes mellitus with no complication ([place]) - Primary (Chronic)    Secondary to prednisone  Stable chronic, diet controlled.       Relevant Orders   POCT glycosylated hemoglobin (Hb A1C) (Completed)   Hypertension associated with diabetes ([facility]) (Chronic)    Stable, chronic.  Continue current medication.   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  Has appointment today to review recent scan and consider restarting chemotherapy since her viral gastroenteritis has resolved        [name], MD

## 2021-03-16 ENCOUNTER — Other Ambulatory Visit: Payer: Self-pay | Admitting: Physician Assistant

## 2021-03-16 ENCOUNTER — Telehealth: Payer: Self-pay | Admitting: Medical Oncology

## 2021-03-16 DIAGNOSIS — C3490 Malignant neoplasm of unspecified part of unspecified bronchus or lung: Secondary | ICD-10-CM

## 2021-03-16 DIAGNOSIS — C3410 Malignant neoplasm of upper lobe, unspecified bronchus or lung: Secondary | ICD-10-CM

## 2021-03-16 MED ORDER — MORPHINE SULFATE ER 60 MG PO TBCR: 60.0000 mg | EXTENDED_RELEASE_TABLET | Freq: Two times a day (BID) | ORAL | 0 refills | Status: DC

## 2021-03-16 NOTE — Telephone Encounter (Signed)
Refill requested for [place] 60 mg. ?

## 2021-03-26 ENCOUNTER — Other Ambulatory Visit: Payer: Self-pay | Admitting: Physician Assistant

## 2021-04-04 ENCOUNTER — Other Ambulatory Visit: Payer: Self-pay | Admitting: Physician Assistant

## 2021-04-04 DIAGNOSIS — C3410 Malignant neoplasm of upper lobe, unspecified bronchus or lung: Secondary | ICD-10-CM

## 2021-04-18 ENCOUNTER — Telehealth: Payer: Self-pay

## 2021-04-18 NOTE — Telephone Encounter (Signed)
Pt request refill of Morphine ?

## 2021-04-19 ENCOUNTER — Other Ambulatory Visit: Payer: Self-pay | Admitting: Internal Medicine

## 2021-04-19 DIAGNOSIS — C3410 Malignant neoplasm of upper lobe, unspecified bronchus or lung: Secondary | ICD-10-CM

## 2021-04-19 DIAGNOSIS — C3490 Malignant neoplasm of unspecified part of unspecified bronchus or lung: Secondary | ICD-10-CM

## 2021-04-19 MED ORDER — MORPHINE SULFATE ER 60 MG PO TBCR: 60.0000 mg | EXTENDED_RELEASE_TABLET | Freq: Two times a day (BID) | ORAL | 0 refills | Status: DC

## 2021-05-05 ENCOUNTER — Other Ambulatory Visit: Payer: Self-pay | Admitting: Physician Assistant

## 2021-05-05 ENCOUNTER — Other Ambulatory Visit: Payer: Self-pay | Admitting: Family Medicine

## 2021-05-05 DIAGNOSIS — C3410 Malignant neoplasm of upper lobe, unspecified bronchus or lung: Secondary | ICD-10-CM

## 2021-05-05 DIAGNOSIS — C3411 Malignant neoplasm of upper lobe, right bronchus or lung: Secondary | ICD-10-CM

## 2021-05-15 ENCOUNTER — Telehealth: Payer: Self-pay | Admitting: Internal Medicine

## 2021-05-15 NOTE — Telephone Encounter (Signed)
Called patient regarding upcoming appointment, patient is notified. °

## 2021-05-29 ENCOUNTER — Telehealth: Payer: Self-pay | Admitting: Medical Oncology

## 2021-05-29 ENCOUNTER — Other Ambulatory Visit: Payer: Self-pay | Admitting: Medical Oncology

## 2021-05-29 ENCOUNTER — Telehealth: Payer: Self-pay | Admitting: Internal Medicine

## 2021-05-29 ENCOUNTER — Other Ambulatory Visit: Payer: Self-pay | Admitting: Internal Medicine

## 2021-05-29 DIAGNOSIS — C3490 Malignant neoplasm of unspecified part of unspecified bronchus or lung: Secondary | ICD-10-CM

## 2021-05-29 DIAGNOSIS — C3410 Malignant neoplasm of upper lobe, unspecified bronchus or lung: Secondary | ICD-10-CM

## 2021-05-29 MED ORDER — MORPHINE SULFATE ER 60 MG PO TBCR: 60.0000 mg | EXTENDED_RELEASE_TABLET | Freq: Two times a day (BID) | ORAL | 0 refills | Status: DC

## 2021-05-29 NOTE — Telephone Encounter (Signed)
Requested refill for MSContin.

## 2021-05-29 NOTE — Telephone Encounter (Signed)
Called patient regarding upcoming appointments, left a voicemail. 

## 2021-06-12 ENCOUNTER — Other Ambulatory Visit: Payer: Self-pay | Admitting: Medical Oncology

## 2021-06-12 DIAGNOSIS — Z91041 Radiographic dye allergy status: Secondary | ICD-10-CM

## 2021-06-12 MED ORDER — PREDNISONE 50 MG PO TABS: ORAL_TABLET | ORAL | 12 refills | Status: DC

## 2021-06-13 ENCOUNTER — Other Ambulatory Visit: Payer: Self-pay

## 2021-06-13 ENCOUNTER — Ambulatory Visit (HOSPITAL_COMMUNITY)
Admission: RE | Admit: 2021-06-13 | Discharge: 2021-06-13 | Disposition: A | Discharge status: Home / Self Care | Payer: Medicare Other | Source: Ambulatory Visit | Attending: Internal Medicine | Admitting: Internal Medicine

## 2021-06-13 ENCOUNTER — Inpatient Hospital Stay: Payer: Medicare Other

## 2021-06-13 ENCOUNTER — Inpatient Hospital Stay: Payer: Medicare Other | Attending: Physician Assistant

## 2021-06-13 DIAGNOSIS — Z7982 Long term (current) use of aspirin: Secondary | ICD-10-CM | POA: Insufficient documentation

## 2021-06-13 DIAGNOSIS — C3490 Malignant neoplasm of unspecified part of unspecified bronchus or lung: Secondary | ICD-10-CM

## 2021-06-13 DIAGNOSIS — Z923 Personal history of irradiation: Secondary | ICD-10-CM | POA: Insufficient documentation

## 2021-06-13 DIAGNOSIS — K838 Other specified diseases of biliary tract: Secondary | ICD-10-CM | POA: Diagnosis not present

## 2021-06-13 DIAGNOSIS — I700 Atherosclerosis of aorta: Secondary | ICD-10-CM | POA: Diagnosis not present

## 2021-06-13 DIAGNOSIS — C7931 Secondary malignant neoplasm of brain: Secondary | ICD-10-CM | POA: Insufficient documentation

## 2021-06-13 DIAGNOSIS — Z79899 Other long term (current) drug therapy: Secondary | ICD-10-CM | POA: Insufficient documentation

## 2021-06-13 DIAGNOSIS — C3410 Malignant neoplasm of upper lobe, unspecified bronchus or lung: Secondary | ICD-10-CM | POA: Insufficient documentation

## 2021-06-13 DIAGNOSIS — Z95828 Presence of other vascular implants and grafts: Secondary | ICD-10-CM

## 2021-06-13 DIAGNOSIS — Z9221 Personal history of antineoplastic chemotherapy: Secondary | ICD-10-CM | POA: Insufficient documentation

## 2021-06-13 DIAGNOSIS — C3411 Malignant neoplasm of upper lobe, right bronchus or lung: Secondary | ICD-10-CM

## 2021-06-13 LAB — COMPREHENSIVE METABOLIC PANEL
ALT: 9 U/L (ref 0–44)
AST: 15 U/L (ref 15–41)
Albumin: 3.8 g/dL (ref 3.5–5.0)
Alkaline Phosphatase: 67 U/L (ref 38–126)
Anion gap: 6 (ref 5–15)
BUN: 16 mg/dL (ref 8–23)
CO2: 29 mmol/L (ref 22–32)
Calcium: 9.8 mg/dL (ref 8.9–10.3)
Chloride: 102 mmol/L (ref 98–111)
Creatinine, Ser: 0.78 mg/dL (ref 0.44–1.00)
GFR, Estimated: >60 mL/min (ref >60)
Glucose, Bld: 125 mg/dL — ABNORMAL HIGH (ref 70–99)
Potassium: 4.3 mmol/L (ref 3.5–5.1)
Sodium: 137 mmol/L (ref 135–145)
Total Bilirubin: 0.5 mg/dL (ref 0.3–1.2)
Total Protein: 6.9 g/dL (ref 6.5–8.1)

## 2021-06-13 LAB — CBC WITH DIFFERENTIAL/PLATELET
Abs Immature Granulocytes: 0.04 10*3/uL (ref 0.00–0.07)
Basophils Absolute: 0.1 10*3/uL (ref 0.0–0.1)
Basophils Relative: 1 %
Eosinophils Absolute: 0.1 10*3/uL (ref 0.0–0.5)
Eosinophils Relative: 1 %
HCT: 36.5 % (ref 36.0–46.0)
Hemoglobin: 12.2 g/dL (ref 12.0–15.0)
Immature Granulocytes: 0 %
Lymphocytes Relative: 29 %
Lymphs Abs: 3.2 10*3/uL (ref 0.7–4.0)
MCH: 30.5 pg (ref 26.0–34.0)
MCHC: 33.4 g/dL (ref 30.0–36.0)
MCV: 91.3 fL (ref 80.0–100.0)
Monocytes Absolute: 0.9 10*3/uL (ref 0.1–1.0)
Monocytes Relative: 9 %
Neutro Abs: 6.7 10*3/uL (ref 1.7–7.7)
Neutrophils Relative %: 60 %
Platelets: 373 10*3/uL (ref 150–400)
RBC: 4 MIL/uL (ref 3.87–5.11)
RDW: 13.6 % (ref 11.5–15.5)
WBC: 11 10*3/uL — ABNORMAL HIGH (ref 4.0–10.5)
nRBC: 0 % (ref 0.0–0.2)

## 2021-06-13 MED ORDER — SODIUM CHLORIDE (PF) 0.9 % IJ SOLN
INTRAMUSCULAR | Status: AC
  Filled 2021-06-13: qty 50

## 2021-06-13 MED ORDER — SODIUM CHLORIDE 0.9% FLUSH
10.0000 mL | INTRAVENOUS | Status: DC | PRN
  Administered 2021-06-13: 10 mL via INTRAVENOUS

## 2021-06-13 MED ORDER — IOHEXOL 300 MG/ML  SOLN
100.0000 mL | Freq: Once | INTRAMUSCULAR | Status: AC | PRN
  Administered 2021-06-13: 100 mL via INTRAVENOUS

## 2021-06-13 MED ORDER — HEPARIN SOD (PORK) LOCK FLUSH 100 UNIT/ML IV SOLN
500.0000 [IU] | Freq: Once | INTRAVENOUS | Status: AC
  Administered 2021-06-13: 500 [IU] via INTRAVENOUS

## 2021-06-14 ENCOUNTER — Telehealth: Payer: Self-pay | Admitting: Internal Medicine

## 2021-06-14 NOTE — Telephone Encounter (Signed)
Called patient regarding upcoming June appointment, patient is notified.

## 2021-06-18 ENCOUNTER — Other Ambulatory Visit: Payer: Self-pay

## 2021-06-18 ENCOUNTER — Encounter: Payer: Self-pay | Admitting: Internal Medicine

## 2021-06-18 ENCOUNTER — Inpatient Hospital Stay (HOSPITAL_BASED_OUTPATIENT_CLINIC_OR_DEPARTMENT_OTHER): Payer: Medicare Other | Admitting: Internal Medicine

## 2021-06-18 VITALS — BP 119/63 | HR 109 | Temp 98.5°F | Resp 17 | Wt 141.5 lb

## 2021-06-18 DIAGNOSIS — Z7982 Long term (current) use of aspirin: Secondary | ICD-10-CM | POA: Diagnosis not present

## 2021-06-18 DIAGNOSIS — C3411 Malignant neoplasm of upper lobe, right bronchus or lung: Secondary | ICD-10-CM | POA: Diagnosis not present

## 2021-06-18 DIAGNOSIS — C7931 Secondary malignant neoplasm of brain: Secondary | ICD-10-CM | POA: Diagnosis not present

## 2021-06-18 DIAGNOSIS — C3410 Malignant neoplasm of upper lobe, unspecified bronchus or lung: Secondary | ICD-10-CM | POA: Diagnosis not present

## 2021-06-18 DIAGNOSIS — Z9221 Personal history of antineoplastic chemotherapy: Secondary | ICD-10-CM | POA: Diagnosis not present

## 2021-06-18 DIAGNOSIS — C3490 Malignant neoplasm of unspecified part of unspecified bronchus or lung: Secondary | ICD-10-CM

## 2021-06-18 DIAGNOSIS — Z79899 Other long term (current) drug therapy: Secondary | ICD-10-CM | POA: Diagnosis not present

## 2021-06-18 DIAGNOSIS — Z923 Personal history of irradiation: Secondary | ICD-10-CM | POA: Diagnosis not present

## 2021-06-18 NOTE — Progress Notes (Signed)
[place] Telephone:([phone]   [phone]  OFFICE PROGRESS NOTE  Danielle Sanders, MD [place] [address]  DIAGNOSIS: Metastatic non-small cell lung cancer initially diagnosed as locally advanced stage IIIB with a right Pancoast tumor involving the vertebral body as well as prominent canal invasion with spinal cord compression in October 2007. The patient also has metastatic disease to the brain in April 2008.  PRIOR THERAPY: Status post concurrent chemoradiation with weekly carboplatin and paclitaxel, last dose was given November 18, 2005. 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 Status post gamma knife radiotherapy to a solitary brain lesion located in the superior frontal area of the brain at [facility] in April of 2008. Status post palliative radiotherapy to the lateral abdominal wall metastatic lesion under the care of Dr. Lisbeth [name], completed March of 2009. Status post 6 cycles of systemic chemotherapy with carboplatin and Alimta. Last dose was given July 26, 2007 with disease stabilization. Gamma knife stereotactic radiotherapy to 2 brain lesions one involving the right frontal dural based as well as right parietal lesion performed on 05/07/2012 under the care of Dr. Vallarie [name] at [facility].  CURRENT THERAPY: Maintenance systemic chemotherapy with Alimta 500 MG/M2 every 3 weeks, status post 233 cycles.  Treatment is currently on hold since January 30, 2021.   INTERVAL HISTORY: Danielle [name] 71 y.o. female returns to the clinic today for follow-up visit accompanied by her daughter Danielle [name].  The patient is feeling fine today with no concerning complaints.  She had few episodes of nausea and vomiting as well as night sweats for the last few days but she is feeling much better now.  She lost few pounds since her last visit.  She denied having any  current chest pain, shortness of breath, cough or hemoptysis.  She has no current nausea, vomiting, diarrhea or constipation.  She has no headache or visual changes.  She has no fever or chills.  The patient had repeat CT scan of the chest, abdomen and pelvis performed recently and she is here for evaluation and discussion of her scan results.  MEDICAL HISTORY: Past Medical History:  Diagnosis Date   Anemia    Antineoplastic chemotherapy induced anemia 01/23/2016   Carcinomas, basal cell 01/22/2019   two places on face   CVA (cerebral vascular accident) ([place]) 04/2014   pt states had 2 cva's within 2 wks   Diabetes mellitus without complication ([place])    DJD (degenerative joint disease), cervical    Fibromyalgia    H/O: pneumonia    History of tobacco abuse quit 10/08   on nicotine patch   Hypertension    Hypokalemia    Lung cancer ([place]) dx'd 09/2005   hx of non-small cell: metastasis to brain. had chemo and radiation for lung ca   Thrush 12/11/2010    ALLERGIES:  is allergic to contrast media [iodinated contrast media], iohexol, sulfa drugs cross reactors, and co-trimoxazole injection [sulfamethoxazole-trimethoprim].  MEDICATIONS:  Current Outpatient Medications  Medication Sig Dispense Refill   albuterol (VENTOLIN HFA) 108 (90 Base) MCG/ACT inhaler Sig: TAKE 2 PUFFS BY MOUTH EVERY 6 HOURS AS NEEDED FOR WHEEZE OR SHORTNESS OF BREATH 18 each 5   alendronate (FOSAMAX) 70 MG tablet TAKE 1 TABLET BY MOUTH EVERY 7 (SEVEN) DAYS. TAKE WITH A FULL GLASS OF WATER ON AN EMPTY STOMACH. 12 tablet 1   amLODipine (NORVASC) 10 MG  tablet TAKE 1 TABLET BY MOUTH EVERY DAY 90 tablet 1   aspirin 325 MG EC tablet TAKE 1 TABLET BY MOUTH EVERY DAY 90 tablet 0   atorvastatin (LIPITOR) 40 MG tablet TAKE 1 TABLET BY MOUTH EVERY DAY AT 6 PM 90 tablet 2   Cholecalciferol (VITAMIN D3) 50 MCG (2000 UT) TABS Take by mouth.     diphenhydrAMINE (BENADRYL) 25 MG tablet Take 1 tablet (25 mg total) by mouth as directed. 2  hours before CT scan. 5 tablet 0   FeFum-FePoly-FA-B Cmp-C-Biot (FOLIVANE-PLUS) CAPS TAKE 1 CAPSULE BY MOUTH EVERY DAY IN THE MORNING 90 capsule 3   fluticasone (FLONASE) 50 MCG/ACT nasal spray Place 1-2 sprays into both nostrils daily as needed. 16 g 5   folic acid (FOLVITE) 1 MG tablet TAKE 1 TABLET BY MOUTH EVERY DAY 90 tablet 1   furosemide (LASIX) 20 MG tablet TAKE 1 TABLET BY MOUTH EVERY DAY AS NEEDED FOR SWELLING 20 tablet 1   INCRUSE ELLIPTA 62.5 MCG/ACT AEPB INHALE 1 PUFF BY MOUTH EVERY DAY 30 each 5   lidocaine-prilocaine (EMLA) cream APPLY TO AFFECTED AREA FOR 1 DOSE 30 g 2   lisinopril (ZESTRIL) 20 MG tablet TAKE 2 TABLETS (40 MG TOTAL) BY MOUTH DAILY. 180 tablet 0   morphine (MS CONTIN) 60 MG 12 hr tablet Take 1 tablet (60 mg total) by mouth 2 (two) times daily. 60 tablet 0   morphine (MSIR) 30 MG tablet Take 1 tablet (30 mg total) by mouth every 4 (four) hours as needed (breakthrough pain). 60 tablet 0   Olopatadine HCl 0.2 % SOLN INSTILL 1 DROP INTO AFFECTED EYE(S) EVERY DAY 2.5 mL 1   ondansetron (ZOFRAN) 8 MG tablet TAKE 1 TABLET BY MOUTH EVERY 8 HOURS AS NEEDED FOR NAUSEA AND VOMITING 20 tablet 1   predniSONE (DELTASONE) 50 MG tablet 1 tablet by mouth 13 hours ,7 hours and 1 hour  before CT scan 3 tablet 12   prochlorperazine (COMPAZINE) 10 MG tablet Take 1 tablet (10 mg total) by mouth every 6 (six) hours as needed. 30 tablet 2   triamcinolone (KENALOG) 0.025 % ointment APPLY TO AFFECTED AREA TWICE A DAY 30 g 0   No current facility-administered medications for this visit.    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   LOOP RECORDER IMPLANT N/A 04/19/2014   Procedure: LOOP RECORDER IMPLANT;  Surgeon: Thompson Grayer, MD;  Location: [facility] C[facility] LAB;  Service: Cardiovascular;  Laterality: N/A;   OTHER SURGICAL HISTORY  2008    Gamma knife surgery to remove brain met    PORTACATH PLACEMENT  -[name]   TIP IN CAVOATRIAL JUNCTION   TEE WITHOUT CARDIOVERSION N/A 04/19/2014   Procedure: TRANSESOPHAGEAL ECHOCARDIOGRAM (TEE);  Surgeon: Larey Dresser, MD;  Location: [place];  Service: Cardiovascular;  Laterality: N/A;    REVIEW OF SYSTEMS:  Constitutional: positive for weight loss Eyes: negative Ears, nose, mouth, throat, and face: negative Respiratory: negative Cardiovascular: negative Gastrointestinal: negative Genitourinary:negative Integument/breast: negative Hematologic/lymphatic: negative Musculoskeletal:negative Neurological: negative Behavioral/Psych: negative Endocrine: negative Allergic/Immunologic: negative   PHYSICAL EXAMINATION: General appearance: alert, cooperative, and no distress Head: Normocephalic, without obvious abnormality, atraumatic Neck: no adenopathy, no JVD, supple, symmetrical, trachea midline, and thyroid not enlarged, symmetric, no tenderness/mass/nodules Lymph nodes: Cervical, supraclavicular, and axillary  nodes normal. Resp: clear to auscultation bilaterally Back: symmetric, no curvature. ROM normal. No CVA tenderness. Cardio: regular rate and rhythm, S1, S2 normal, no murmur, click, rub or gallop GI: soft, non-tender; bowel sounds normal; no masses,  no organomegaly Extremities: extremities normal, atraumatic, no cyanosis or edema Neurologic: Alert and oriented X 3, normal strength and tone. Normal symmetric reflexes. Normal coordination and gait  ECOG PERFORMANCE STATUS: 1 - Symptomatic but completely ambulatory  Blood pressure 119/63, pulse (!) 109, temperature 98.5 F (36.9 C), temperature source Tympanic, resp. rate 17, weight 141 lb 8 oz (64.2 kg), SpO2 90 %.  LABORATORY DATA: Lab Results  Component Value Date   WBC 11.0 (H) 06/13/2021   HGB 12.2 06/13/2021   HCT 36.5 06/13/2021   MCV 91.3 06/13/2021   PLT 373 06/13/2021      Chemistry      Component  Value Date/Time   NA 137 06/13/2021 1243   NA 142 12/24/2016 0816   K 4.3 06/13/2021 1243   K 4.4 12/24/2016 0816   CL 102 06/13/2021 1243   CL 106 06/30/2012 1004   CO2 29 06/13/2021 1243   CO2 28 12/24/2016 0816   BUN 16 06/13/2021 1243   BUN 15.8 12/24/2016 0816   CREATININE 0.78 06/13/2021 1243   CREATININE 0.97 10/18/2020 1323   CREATININE 1.1 12/24/2016 0816      Component Value Date/Time   CALCIUM 9.8 06/13/2021 1243   CALCIUM 9.6 12/24/2016 0816   ALKPHOS 67 06/13/2021 1243   ALKPHOS 80 12/24/2016 0816   AST 15 06/13/2021 1243   AST 21 10/18/2020 1323   AST 20 12/24/2016 0816   ALT 9 06/13/2021 1243   ALT 13 10/18/2020 1323   ALT 11 12/24/2016 0816   BILITOT 0.5 06/13/2021 1243   BILITOT 0.3 10/18/2020 1323   BILITOT 0.34 12/24/2016 0816       RADIOGRAPHIC STUDIES: CT Abdomen Pelvis W Contrast  Result Date: 06/14/2021 CLINICAL DATA:  Primary Cancer Type: Lung Imaging Indication: Routine surveillance Interval therapy since last imaging? No Initial Cancer Diagnosis Date: 10/16/2005; Established by: Biopsy-proven Detailed Pathology: Metastatic non-small cell lung cancer initially diagnosed as locally advanced stage IIIB with a right Pancoast tumor involving the vertebral body as well as prominent canal invasion with spinal cord compression in October 2007. Metastatic disease to the brain April 2008. Primary Tumor location: Right upper lobe. Surgeries: Implantable loop recorder 2016.  Kyphosis surgery 2008. Chemotherapy: Yes; Ongoing? No; Most recent administration: 01/30/2021 Immunotherapy? No Radiation therapy? Yes; Date Range: 2009; Target: lateral abdominal wall Other Cancer Therapies: Gamma knife stereotactic radiotherapy to brain lesions. * Tracking Code: BO * EXAM: CT CHEST, ABDOMEN, AND PELVIS WITH CONTRAST TECHNIQUE: Multidetector CT imaging of the chest, abdomen and pelvis was performed following the standard protocol during bolus administration of intravenous  contrast. RADIATION DOSE REDUCTION: This exam was performed according to the departmental dose-optimization program which includes automated exposure control, adjustment of the mA and/or kV according to patient size and/or use of iterative reconstruction technique. CONTRAST:  169m OMNIPAQUE IOHEXOL 300 MG/ML  SOLN COMPARISON:  Most recent CT chest, abdomen and pelvis 03/09/2021. FINDINGS: CT CHEST FINDINGS Cardiovascular: Normal heart size. No significant pericardial effusion/thickening. Left anterior descending and left circumflex coronary atherosclerosis. Right internal jugular Port-A-Cath terminates at the cavoatrial junction. Atherosclerotic nonaneurysmal thoracic aorta. Normal caliber pulmonary arteries. No central pulmonary emboli. Mediastinum/Nodes: No discrete thyroid nodules. Unremarkable esophagus. No pathologically enlarged axillary, mediastinal or hilar lymph nodes. Lungs/Pleura: No pneumothorax. No pleural effusion.  Severe centrilobular emphysema. Sharply marginated medial and apical right upper lobe consolidation with some associated volume loss and distortion, unchanged, compatible with radiation fibrosis. Tiny posterior right upper lobe 0.3 cm solid pulmonary nodule (series 4/image 40) is stable. Poorly marginated irregular predominantly solid 1.7 x 1.2 cm anteromedial left upper lobe pulmonary nodule (series 4/image 50), previously 1.4 x 0.9 cm, increased. No additional significant pulmonary nodules. Musculoskeletal: Chronic severe T3 vertebral compression fracture. Vertebroplasty material noted within the T4 and T7 vertebral bodies. No aggressive appearing focal osseous lesions. Mild thoracic spondylosis. Subcutaneous loop recorder in the ventral medial left chest wall. CT ABDOMEN PELVIS FINDINGS Hepatobiliary: Normal liver with no liver mass. Normal gallbladder with no radiopaque cholelithiasis. No significant intrahepatic biliary ductal dilatation. Stable borderline mild common bile duct  dilation (7 mm diameter). Pancreas: Normal, with no mass or duct dilation. Spleen: Normal size. No mass. Adrenals/Urinary Tract: Normal adrenals. No hydronephrosis. A few scattered subcentimeter hypodense bilateral renal cortical lesions are too small to characterize, for which no follow-up is recommended. Normal bladder. Stomach/Bowel: Normal non-distended stomach. Normal caliber small bowel with no small bowel wall thickening. Normal appendix. Oral contrast transits to the right colon. Normal large bowel with no diverticulosis, large bowel wall thickening or pericolonic fat stranding. Vascular/Lymphatic: Atherosclerotic nonaneurysmal abdominal aorta. Patent portal, splenic, hepatic and renal veins. No pathologically enlarged lymph nodes in the abdomen or pelvis. Reproductive: Grossly normal uterus.  No adnexal mass. Other: No pneumoperitoneum, ascites or focal fluid collection. Musculoskeletal: No aggressive appearing focal osseous lesions. Mild lumbar spondylosis. IMPRESSION: 1. Poorly marginated irregular predominantly solid 1.7 cm anteromedial left upper lobe pulmonary nodule, increased in size, suspicious for metachronous primary bronchogenic carcinoma versus solitary metastasis. 2. 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 3. No evidence of metastatic disease in the abdomen or pelvis. 4. Aortic Atherosclerosis (ICD10-I70.0) and Emphysema (ICD10-J43.9). Electronically Signed   By: Ilona Sorrel M.D.   On: 06/14/2021 11:59   CT Chest W Contrast  Result Date: 06/14/2021 CLINICAL DATA:  Primary Cancer Type: Lung Imaging Indication: Routine surveillance Interval therapy since last imaging? No Initial Cancer Diagnosis Date: 10/16/2005; Established by: Biopsy-proven Detailed Pathology: Metastatic non-small cell lung cancer initially diagnosed as locally advanced stage IIIB with a right Pancoast tumor involving the vertebral body as well as  prominent canal invasion with spinal cord compression in October 2007. Metastatic disease to the brain April 2008. Primary Tumor location: Right upper lobe. Surgeries: Implantable loop recorder 2016.  Kyphosis surgery 2008. Chemotherapy: Yes; Ongoing? No; Most recent administration: 01/30/2021 Immunotherapy? No Radiation therapy? Yes; Date Range: 2009; Target: lateral abdominal wall Other Cancer Therapies: Gamma knife stereotactic radiotherapy to brain lesions. * Tracking Code: BO * EXAM: CT CHEST, ABDOMEN, AND PELVIS WITH CONTRAST TECHNIQUE: Multidetector CT imaging of the chest, abdomen and pelvis was performed following the standard protocol during bolus administration of intravenous contrast. RADIATION DOSE REDUCTION: This exam was performed according to the departmental dose-optimization program which includes automated exposure control, adjustment of the mA and/or kV according to patient size and/or use of iterative reconstruction technique. CONTRAST:  178m OMNIPAQUE IOHEXOL 300 MG/ML  SOLN COMPARISON:  Most recent CT chest, abdomen and pelvis 03/09/2021. FINDINGS: CT CHEST FINDINGS Cardiovascular: Normal heart size. No significant pericardial effusion/thickening. Left anterior descending and left circumflex coronary atherosclerosis. Right internal jugular Port-A-Cath terminates at the cavoatrial junction. Atherosclerotic nonaneurysmal thoracic aorta. Normal caliber pulmonary arteries. No central pulmonary emboli. Mediastinum/Nodes: No discrete thyroid nodules.  Unremarkable esophagus. No pathologically enlarged axillary, mediastinal or hilar lymph nodes. Lungs/Pleura: No pneumothorax. No pleural effusion. Severe centrilobular emphysema. Sharply marginated medial and apical right upper lobe consolidation with some associated volume loss and distortion, unchanged, compatible with radiation fibrosis. Tiny posterior right upper lobe 0.3 cm solid pulmonary nodule (series 4/image 40) is stable. Poorly marginated  irregular predominantly solid 1.7 x 1.2 cm anteromedial left upper lobe pulmonary nodule (series 4/image 50), previously 1.4 x 0.9 cm, increased. No additional significant pulmonary nodules. Musculoskeletal: Chronic severe T3 vertebral compression fracture. Vertebroplasty material noted within the T4 and T7 vertebral bodies. No aggressive appearing focal osseous lesions. Mild thoracic spondylosis. Subcutaneous loop recorder in the ventral medial left chest wall. CT ABDOMEN PELVIS FINDINGS Hepatobiliary: Normal liver with no liver mass. Normal gallbladder with no radiopaque cholelithiasis. No significant intrahepatic biliary ductal dilatation. Stable borderline mild common bile duct dilation (7 mm diameter). Pancreas: Normal, with no mass or duct dilation. Spleen: Normal size. No mass. Adrenals/Urinary Tract: Normal adrenals. No hydronephrosis. A few scattered subcentimeter hypodense bilateral renal cortical lesions are too small to characterize, for which no follow-up is recommended. Normal bladder. Stomach/Bowel: Normal non-distended stomach. Normal caliber small bowel with no small bowel wall thickening. Normal appendix. Oral contrast transits to the right colon. Normal large bowel with no diverticulosis, large bowel wall thickening or pericolonic fat stranding. Vascular/Lymphatic: Atherosclerotic nonaneurysmal abdominal aorta. Patent portal, splenic, hepatic and renal veins. No pathologically enlarged lymph nodes in the abdomen or pelvis. Reproductive: Grossly normal uterus.  No adnexal mass. Other: No pneumoperitoneum, ascites or focal fluid collection. Musculoskeletal: No aggressive appearing focal osseous lesions. Mild lumbar spondylosis. IMPRESSION: 1. Poorly marginated irregular predominantly solid 1.7 cm anteromedial left upper lobe pulmonary nodule, increased in size, suspicious for metachronous primary bronchogenic carcinoma versus solitary metastasis. 2. Stable radiation fibrosis in the medial and apical  right upper lobe with no evidence of local tumor recurrence. 3. No evidence of metastatic disease in the abdomen or pelvis. 4. Aortic Atherosclerosis (ICD10-I70.0) and Emphysema (ICD10-J43.9). Electronically Signed   By: Ilona Sorrel M.D.   On: 06/14/2021 11:59    ASSESSMENT AND PLAN: This is a very pleasant 71 years old white female with metastatic non-small cell lung cancer diagnosed in October 2007 status post concurrent chemoradiation followed by consolidation chemotherapy and she is currently on maintenance treatment with single agent Alimta status post 233 cycles. The patient has been tolerating this treatment well but recently she started having more nausea as well as fatigue. Her treatment is currently on hold and she has been on observation for the last 6 months and feeling well. The patient had repeat CT scan of the chest, abdomen pelvis performed recently.  I personally and independently reviewed the scan images and discussed the result with the patient and her daughter. Her scan showed slight further increase in the size of the poorly marginated irregular solid 1.7 cm anterior medial left upper lobe pulmonary nodule suspicious for metachronous primary bronchogenic carcinoma versus solitary metastasis. I recommended for the patient to see radiation oncology for consideration of SBRT to this lesion. She will continue on observation with repeat CT scan of the chest, abdomen pelvis in 3 months for restaging of her disease. The patient was advised to call immediately if she has any other concerning symptoms in the interval. The patient voices understanding of current disease status and treatment options and is in agreement with the current care plan. All questions were answered. The patient knows to call the clinic with  any problems, questions or concerns. We can certainly see the patient much sooner if necessary. The total time spent in the appointment was 35 minutes.  Disclaimer: This note  was dictated with voice recognition software. Similar sounding words can inadvertently be transcribed and may not be corrected upon review.

## 2021-06-22 ENCOUNTER — Telehealth: Payer: Self-pay | Admitting: Radiation Oncology

## 2021-06-22 NOTE — Telephone Encounter (Signed)
Unable to LVM to schedule CON with Dr. Lisbeth Renshaw

## 2021-06-25 NOTE — Progress Notes (Signed)
Radiation Oncology         [phone] ________________________________  Name: Danielle [name]        MRN: [id number]  Date of Service: 06/27/2021 DOB: [birth year]  OV:ZCHYIFO, Mervyn Gay, MD  Curt Bears, MD     REFERRING PHYSICIAN: Curt Bears, MD   DIAGNOSIS: The encounter diagnosis was Cancer of upper lobe of right lung [facility]   HISTORY OF PRESENT ILLNESS: Danielle [name] is a 71 y.o. female seen at the request of Dr. Julien Nordmann for history of Metastatic lung cancer.  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  She received chemoradiation at that time which she completed in November 2007.  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  She received a palliative course of radiotherapy to a abdominal wall metastasis with Dr. Lisbeth Renshaw which was completed in 2009 and then continuation of carboplatin Alimta until July 2009.  It appears that she had an additional course of gamma knife treatment with Dr. Vallarie Mare at [facility] for 2 lesions that were seen in 2014. She continues to follow with Dr. Vallarie Mare, and had a stable post treatment MRI scan in February 2023. She has been on maintenance Alimta for greater than 200 cycles but this has been on hold since January 2023.  Recent CT chest abdomen pelvis on 06/13/2021 showed an increase in the size of an irregular solid 1.7 cm left upper lobe nodule suspicious for solitary metastasis versus synchronous primary.  She is seen to consider stereotactic body radiotherapy for this.   PREVIOUS RADIATION THERAPY: Yes   05/2012 Gamma knife radiosurgery to 2 brain metastases: a right parietal lesion 2-3 mm, and a right frontal lesion both treated to a total of 22 Gy, [facility] Dr. Vallarie Mare  03/03/07-03/23/07 The left abdominal wall target  was treated to 35 Gy in 14 fractions at 2.5 Gy per fraction with Dr. Lisbeth Renshaw  10/2006  Gamma knife radiosurgery 20 GY to the left superior frontal gyrus,  [place] Dr. Vallarie Mare   10/17/05-11/20/05 The RUL target including mediastinum, spine and right supraclavicular fossa were treated to 45 Gy in 25 fractions at 1.8 Gy per fraction with 3D conformal technique with Dr. Elba Barman   PAST MEDICAL HISTORY:  Past Medical History:  Diagnosis Date   Anemia    Antineoplastic chemotherapy induced anemia 01/23/2016   Carcinomas, basal cell 01/22/2019   two places on face   CVA (cerebral vascular accident) ([place]) 04/2014   pt states had 2 cva's within 2 wks   Diabetes mellitus without complication ([place])    DJD (degenerative joint disease), cervical    Fibromyalgia    H/O: pneumonia    History of tobacco abuse quit 10/08   on nicotine patch   Hypertension    Hypokalemia    Lung cancer ([place]) dx'd 09/2005   hx of non-small cell: metastasis to brain. had chemo and radiation for lung ca   Thrush 12/11/2010       PAST SURGICAL HISTORY: Past Surgical History:  Procedure Laterality Date   [place]   Left x 2   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   LOOP RECORDER IMPLANT N/A 04/19/2014   Procedure: LOOP  RECORDER IMPLANT;  Surgeon: Thompson Grayer, MD;  Location: [facility] CATH LAB;  Service: Cardiovascular;  Laterality: N/A;   OTHER SURGICAL HISTORY  2008   Gamma knife surgery to remove brain met    PORTACATH PLACEMENT  -[name]   TIP IN CAVOATRIAL JUNCTION   TEE WITHOUT CARDIOVERSION N/A 04/19/2014   Procedure: TRANSESOPHAGEAL ECHOCARDIOGRAM (TEE);  Surgeon: Larey Dresser, MD;  Location: [facility] ENDOSCOPY;  Service: Cardiovascular;  Laterality: N/A;     FAMILY HISTORY:  Family History  Problem Relation Age of Onset   Diabetes Father    Lymphoma Father    Stroke Father    Liver cancer Mother    Lung cancer Mother    Cancer Mother    Cancer  Sister 61       ovarian cancer   Hyperlipidemia Brother    Healthy Daughter    Healthy Son    Healthy Son      SOCIAL HISTORY:  reports that she quit smoking about 15 years ago. Her smoking use included cigarettes. She has a 60.00 pack-year smoking history. She has never used smokeless tobacco. She reports that she does not drink alcohol and does not use drugs. The patient is divorced and lives in [place] with her daughter and her family. She's accompanied by her sister.    ALLERGIES: Contrast media [iodinated contrast media], Iohexol, Sulfa drugs cross reactors, and Co-trimoxazole injection [sulfamethoxazole-trimethoprim]   MEDICATIONS:  Current Outpatient Medications  Medication Sig Dispense Refill   albuterol (VENTOLIN HFA) 108 (90 Base) MCG/ACT inhaler Sig: TAKE 2 PUFFS BY MOUTH EVERY 6 HOURS AS NEEDED FOR WHEEZE OR SHORTNESS OF BREATH 18 each 5   alendronate (FOSAMAX) 70 MG tablet TAKE 1 TABLET BY MOUTH EVERY 7 (SEVEN) DAYS. TAKE WITH A FULL GLASS OF WATER ON AN EMPTY STOMACH. 12 tablet 1   amLODipine (NORVASC) 10 MG tablet TAKE 1 TABLET BY MOUTH EVERY DAY 90 tablet 1   aspirin 325 MG EC tablet TAKE 1 TABLET BY MOUTH EVERY DAY 90 tablet 0   atorvastatin (LIPITOR) 40 MG tablet TAKE 1 TABLET BY MOUTH EVERY DAY AT 6 PM 90 tablet 2   Cholecalciferol (VITAMIN D3) 50 MCG (2000 UT) TABS Take by mouth.     diphenhydrAMINE (BENADRYL) 25 MG tablet Take 1 tablet (25 mg total) by mouth as directed. 2 hours before CT scan. (Patient not taking: Reported on 06/18/2021) 5 tablet 0   FeFum-FePoly-FA-B Cmp-C-Biot (FOLIVANE-PLUS) CAPS TAKE 1 CAPSULE BY MOUTH EVERY DAY IN THE MORNING 90 capsule 3   fluticasone (FLONASE) 50 MCG/ACT nasal spray Place 1-2 sprays into both nostrils daily as needed. 16 g 5   folic acid (FOLVITE) 1 MG tablet TAKE 1 TABLET BY MOUTH EVERY DAY 90 tablet 1   furosemide (LASIX) 20 MG tablet TAKE 1 TABLET BY MOUTH EVERY DAY AS NEEDED FOR SWELLING 20 tablet 1   INCRUSE ELLIPTA 62.5  MCG/ACT AEPB INHALE 1 PUFF BY MOUTH EVERY DAY 30 each 5   lidocaine-prilocaine (EMLA) cream APPLY TO AFFECTED AREA FOR 1 DOSE 30 g 2   lisinopril (ZESTRIL) 20 MG tablet TAKE 2 TABLETS (40 MG TOTAL) BY MOUTH DAILY. 180 tablet 0   morphine (MS CONTIN) 60 MG 12 hr tablet Take 1 tablet (60 mg total) by mouth 2 (two) times daily. 60 tablet 0   morphine (MSIR) 30 MG tablet Take 1 tablet (30 mg total) by mouth every 4 (four) hours as needed (breakthrough pain). 60 tablet 0   Olopatadine HCl 0.2 %  SOLN INSTILL 1 DROP INTO AFFECTED EYE(S) EVERY DAY 2.5 mL 1   ondansetron (ZOFRAN) 8 MG tablet TAKE 1 TABLET BY MOUTH EVERY 8 HOURS AS NEEDED FOR NAUSEA AND VOMITING 20 tablet 1   predniSONE (DELTASONE) 50 MG tablet 1 tablet by mouth 13 hours ,7 hours and 1 hour  before CT scan (Patient not taking: Reported on 06/18/2021) 3 tablet 12   prochlorperazine (COMPAZINE) 10 MG tablet Take 1 tablet (10 mg total) by mouth every 6 (six) hours as needed. 30 tablet 2   triamcinolone (KENALOG) 0.025 % ointment APPLY TO AFFECTED AREA TWICE A DAY 30 g 0   No current facility-administered medications for this encounter.     REVIEW OF SYSTEMS: On review of systems, the patient reports that she is doing well overall. She reports shortness of breath at baseline with exertion. No new symptoms of cough, hemoptysis, or weight loss are noted. She's gained about 5 pounds and does complain of night sweats but no fevers. She reports upper thoracic level back pain which is chronic and related to compression fracture. No other complaints are verbalized.       PHYSICAL EXAM:  Wt Readings from Last 3 Encounters:  06/27/21 150 lb 6.4 oz (68.2 kg)  06/18/21 141 lb 8 oz (64.2 kg)  03/13/21 152 lb 5 oz (69.1 kg)   Temp Readings from Last 3 Encounters:  06/27/21 98.1 F (36.7 C) (Temporal)  06/18/21 98.5 F (36.9 C) (Tympanic)  03/13/21 98.1 F (36.7 C) (Tympanic)   BP Readings from Last 3 Encounters:  06/27/21 (!) 141/52  06/18/21  119/63  03/13/21 (!) 128/49   Pulse Readings from Last 3 Encounters:  06/27/21 82  06/18/21 (!) 109  03/13/21 80   Pain Assessment Pain Score: 8  (Upper back pain)/10  In general this is a well appearing caucasian female in no acute distress. She's alert and oriented x4 and appropriate throughout the examination. Cardiopulmonary assessment is negative for acute distress and she exhibits normal effort.     ECOG = 1  0 - Asymptomatic (Fully active, able to carry on all predisease activities without restriction)  1 - Symptomatic but completely ambulatory (Restricted in physically strenuous activity but ambulatory and able to carry out work of a light or sedentary nature. For example, light housework, office work)  2 - Symptomatic, <50% in bed during the day (Ambulatory and capable of all self care but unable to carry out any work activities. Up and about more than 50% of waking hours)  3 - Symptomatic, >50% in bed, but not bedbound (Capable of only limited self-care, confined to bed or chair 50% or more of waking hours)  4 - Bedbound (Completely disabled. Cannot carry on any self-care. Totally confined to bed or chair)  5 - Death   Eustace Pen MM, Creech RH, Tormey DC, et al. [phone]). "Toxicity and response criteria of the [employer]". [place] Oncol. 5 (6): 649-55    LABORATORY DATA:  Lab Results  Component Value Date   WBC 11.0 (H) 06/13/2021   HGB 12.2 06/13/2021   HCT 36.5 06/13/2021   MCV 91.3 06/13/2021   PLT 373 06/13/2021   Lab Results  Component Value Date   NA 137 06/13/2021   K 4.3 06/13/2021   CL 102 06/13/2021   CO2 29 06/13/2021   Lab Results  Component Value Date   ALT 9 06/13/2021   AST 15 06/13/2021   ALKPHOS 67 06/13/2021   BILITOT 0.5  06/13/2021      RADIOGRAPHY: CT Abdomen Pelvis W Contrast  Result Date: 06/14/2021 CLINICAL DATA:  Primary Cancer Type: Lung Imaging Indication: Routine surveillance Interval therapy since  last imaging? No Initial Cancer Diagnosis Date: 10/16/2005; Established by: Biopsy-proven Detailed Pathology: Metastatic non-small cell lung cancer initially diagnosed as locally advanced stage IIIB with a right Pancoast tumor involving the vertebral body as well as prominent canal invasion with spinal cord compression in October 2007. Metastatic disease to the brain April 2008. Primary Tumor location: Right upper lobe. Surgeries: Implantable loop recorder 2016.  Kyphosis surgery 2008. Chemotherapy: Yes; Ongoing? No; Most recent administration: 01/30/2021 Immunotherapy? No Radiation therapy? Yes; Date Range: 2009; Target: lateral abdominal wall Other Cancer Therapies: Gamma knife stereotactic radiotherapy to brain lesions. * Tracking Code: BO * EXAM: CT CHEST, ABDOMEN, AND PELVIS WITH CONTRAST TECHNIQUE: Multidetector CT imaging of the chest, abdomen and pelvis was performed following the standard protocol during bolus administration of intravenous contrast. RADIATION DOSE REDUCTION: This exam was performed according to the departmental dose-optimization program which includes automated exposure control, adjustment of the mA and/or kV according to patient size and/or use of iterative reconstruction technique. CONTRAST:  12m OMNIPAQUE IOHEXOL 300 MG/ML  SOLN COMPARISON:  Most recent CT chest, abdomen and pelvis 03/09/2021. FINDINGS: CT CHEST FINDINGS Cardiovascular: Normal heart size. No significant pericardial effusion/thickening. Left anterior descending and left circumflex coronary atherosclerosis. Right internal jugular Port-A-Cath terminates at the cavoatrial junction. Atherosclerotic nonaneurysmal thoracic aorta. Normal caliber pulmonary arteries. No central pulmonary emboli. Mediastinum/Nodes: No discrete thyroid nodules. Unremarkable esophagus. No pathologically enlarged axillary, mediastinal or hilar lymph nodes. Lungs/Pleura: No pneumothorax. No pleural effusion. Severe centrilobular emphysema. Sharply  marginated medial and apical right upper lobe consolidation with some associated volume loss and distortion, unchanged, compatible with radiation fibrosis. Tiny posterior right upper lobe 0.3 cm solid pulmonary nodule (series 4/image 40) is stable. Poorly marginated irregular predominantly solid 1.7 x 1.2 cm anteromedial left upper lobe pulmonary nodule (series 4/image 50), previously 1.4 x 0.9 cm, increased. No additional significant pulmonary nodules. Musculoskeletal: Chronic severe T3 vertebral compression fracture. Vertebroplasty material noted within the T4 and T7 vertebral bodies. No aggressive appearing focal osseous lesions. Mild thoracic spondylosis. Subcutaneous loop recorder in the ventral medial left chest wall. CT ABDOMEN PELVIS FINDINGS Hepatobiliary: Normal liver with no liver mass. Normal gallbladder with no radiopaque cholelithiasis. No significant intrahepatic biliary ductal dilatation. Stable borderline mild common bile duct dilation (7 mm diameter). Pancreas: Normal, with no mass or duct dilation. Spleen: Normal size. No mass. Adrenals/Urinary Tract: Normal adrenals. No hydronephrosis. A few scattered subcentimeter hypodense bilateral renal cortical lesions are too small to characterize, for which no follow-up is recommended. Normal bladder. Stomach/Bowel: Normal non-distended stomach. Normal caliber small bowel with no small bowel wall thickening. Normal appendix. Oral contrast transits to the right colon. Normal large bowel with no diverticulosis, large bowel wall thickening or pericolonic fat stranding. Vascular/Lymphatic: Atherosclerotic nonaneurysmal abdominal aorta. Patent portal, splenic, hepatic and renal veins. No pathologically enlarged lymph nodes in the abdomen or pelvis. Reproductive: Grossly normal uterus.  No adnexal mass. Other: No pneumoperitoneum, ascites or focal fluid collection. Musculoskeletal: No aggressive appearing focal osseous lesions. Mild lumbar spondylosis.  IMPRESSION: 1. Poorly marginated irregular predominantly solid 1.7 cm anteromedial left upper lobe pulmonary nodule, increased in size, suspicious for metachronous primary bronchogenic carcinoma versus solitary metastasis. 2. 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 3. No evidence of metastatic disease in the  abdomen or pelvis. 4. Aortic Atherosclerosis (ICD10-I70.0) and Emphysema (ICD10-J43.9). Electronically Signed   By: Ilona Sorrel M.D.   On: 06/14/2021 11:59   CT Chest W Contrast  Result Date: 06/14/2021 CLINICAL DATA:  Primary Cancer Type: Lung Imaging Indication: Routine surveillance Interval therapy since last imaging? No Initial Cancer Diagnosis Date: 10/16/2005; Established by: Biopsy-proven Detailed Pathology: Metastatic non-small cell lung cancer initially diagnosed as locally advanced stage IIIB with a right Pancoast tumor involving the vertebral body as well as prominent canal invasion with spinal cord compression in October 2007. Metastatic disease to the brain April 2008. Primary Tumor location: Right upper lobe. Surgeries: Implantable loop recorder 2016.  Kyphosis surgery 2008. Chemotherapy: Yes; Ongoing? No; Most recent administration: 01/30/2021 Immunotherapy? No Radiation therapy? Yes; Date Range: 2009; Target: lateral abdominal wall Other Cancer Therapies: Gamma knife stereotactic radiotherapy to brain lesions. * Tracking Code: BO * EXAM: CT CHEST, ABDOMEN, AND PELVIS WITH CONTRAST TECHNIQUE: Multidetector CT imaging of the chest, abdomen and pelvis was performed following the standard protocol during bolus administration of intravenous contrast. RADIATION DOSE REDUCTION: This exam was performed according to the departmental dose-optimization program which includes automated exposure control, adjustment of the mA and/or kV according to patient size and/or use of iterative reconstruction technique. CONTRAST:  146m  OMNIPAQUE IOHEXOL 300 MG/ML  SOLN COMPARISON:  Most recent CT chest, abdomen and pelvis 03/09/2021. FINDINGS: CT CHEST FINDINGS Cardiovascular: Normal heart size. No significant pericardial effusion/thickening. Left anterior descending and left circumflex coronary atherosclerosis. Right internal jugular Port-A-Cath terminates at the cavoatrial junction. Atherosclerotic nonaneurysmal thoracic aorta. Normal caliber pulmonary arteries. No central pulmonary emboli. Mediastinum/Nodes: No discrete thyroid nodules. Unremarkable esophagus. No pathologically enlarged axillary, mediastinal or hilar lymph nodes. Lungs/Pleura: No pneumothorax. No pleural effusion. Severe centrilobular emphysema. Sharply marginated medial and apical right upper lobe consolidation with some associated volume loss and distortion, unchanged, compatible with radiation fibrosis. Tiny posterior right upper lobe 0.3 cm solid pulmonary nodule (series 4/image 40) is stable. Poorly marginated irregular predominantly solid 1.7 x 1.2 cm anteromedial left upper lobe pulmonary nodule (series 4/image 50), previously 1.4 x 0.9 cm, increased. No additional significant pulmonary nodules. Musculoskeletal: Chronic severe T3 vertebral compression fracture. Vertebroplasty material noted within the T4 and T7 vertebral bodies. No aggressive appearing focal osseous lesions. Mild thoracic spondylosis. Subcutaneous loop recorder in the ventral medial left chest wall. CT ABDOMEN PELVIS FINDINGS Hepatobiliary: Normal liver with no liver mass. Normal gallbladder with no radiopaque cholelithiasis. No significant intrahepatic biliary ductal dilatation. Stable borderline mild common bile duct dilation (7 mm diameter). Pancreas: Normal, with no mass or duct dilation. Spleen: Normal size. No mass. Adrenals/Urinary Tract: Normal adrenals. No hydronephrosis. A few scattered subcentimeter hypodense bilateral renal cortical lesions are too small to characterize, for which no  follow-up is recommended. Normal bladder. Stomach/Bowel: Normal non-distended stomach. Normal caliber small bowel with no small bowel wall thickening. Normal appendix. Oral contrast transits to the right colon. Normal large bowel with no diverticulosis, large bowel wall thickening or pericolonic fat stranding. Vascular/Lymphatic: Atherosclerotic nonaneurysmal abdominal aorta. Patent portal, splenic, hepatic and renal veins. No pathologically enlarged lymph nodes in the abdomen or pelvis. Reproductive: Grossly normal uterus.  No adnexal mass. Other: No pneumoperitoneum, ascites or focal fluid collection. Musculoskeletal: No aggressive appearing focal osseous lesions. Mild lumbar spondylosis. IMPRESSION: 1. Poorly marginated irregular predominantly solid 1.7 cm anteromedial left upper lobe pulmonary nodule, increased in size, suspicious for metachronous primary bronchogenic carcinoma versus solitary metastasis. 2. Stable radiation fibrosis in the medial and apical right  upper lobe with no evidence of local tumor recurrence. 3. No evidence of metastatic disease in the abdomen or pelvis. 4. Aortic Atherosclerosis (ICD10-I70.0) and Emphysema (ICD10-J43.9). Electronically Signed   By: Ilona Sorrel M.D.   On: 06/14/2021 11:59       IMPRESSION/PLAN: 1. Progressive metastatic Stage IIIb right Pancoast tumor involving the vertebral body and canal with brain metastases,currently with either progressive disease in the left lung versus synchronous primary stage I lung cancer. Dr. Lisbeth Renshaw discusses the patient's course to date.  She has done very well over the course of her diagnosis in general, and we discussed the rationale for locally aggressive treatment as systemically she is doing quite well otherwise.  Dr. Lisbeth Renshaw would like to offer her a course of stereotactic body radiotherapy (SBRT) to the LUL nodule.  We discussed the risks, benefits, short, and long term effects of radiotherapy, as well as the curative intent, and  the patient is interested in proceeding. Dr. Lisbeth Renshaw discusses the delivery and logistics of radiotherapy and anticipates a course of 3-5 fractions of radiotherapy. Written consent is obtained and placed in the chart, a copy was provided to the patient. The patient will be contacted to coordinate treatment planning by our simulation department.   In a visit lasting 60 minutes, greater than 50% of the time was spent face to face discussing the patient's condition, in preparation for the discussion, and coordinating the patient's care.   The above documentation reflects my direct findings during this shared patient visit. Please see the separate note by Dr. Lisbeth Renshaw on this date for the remainder of the patient's plan of care.    Carola Rhine, [facility]   **Disclaimer: This note was dictated with voice recognition software. Similar sounding words can inadvertently be transcribed and this note may contain transcription errors which may not have been corrected upon publication of note.**

## 2021-06-26 NOTE — Progress Notes (Signed)
Thoracic Location of Tumor / Histology:  Left upper lobe nodule suspicious for solitary metastasis versus synchronous primary (history of   Patient presented with symptoms of:  CT CAP w/ Contrast 06/13/2021 --IMPRESSION: Poorly marginated irregular predominantly solid 1.7 cm anteromedial left upper lobe pulmonary nodule, increased in size, suspicious for metachronous primary bronchogenic carcinoma versus solitary metastasis. 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 No evidence of metastatic disease in the abdomen or pelvis. Aortic Atherosclerosis  Tobacco/Marijuana/Snuff/ETOH use: None. Quit smoking tobacco 2007.  Past/Anticipated interventions by medical oncology, if any:  Under care of Dr. Curt Bears 06/18/2021 --CURRENT THERAPY:  Maintenance systemic chemotherapy with Alimta 500 MG/M2 every 3 weeks, status post 233 cycles.   Treatment is currently on hold since January 30, 2021 --ASSESSMENT AND PLAN: Pleasant 71 years old white female with metastatic non-small cell lung cancer diagnosed in October 2007 status post concurrent chemoradiation followed by consolidation chemotherapy and she is currently on maintenance treatment with single agent Alimta status post 233 cycles. The patient has been tolerating this treatment well but recently she started having more nausea as well as fatigue. Her treatment is currently on hold and she has been on observation for the last 6 months and feeling well. The patient had repeat CT scan of the chest, abdomen pelvis performed recently.   I personally and independently reviewed the scan images and discussed the result with the patient and her daughter. I recommended for the patient to see radiation oncology for consideration of SBRT to this lesion. She will continue on observation with repeat CT scan of the chest, abdomen pelvis in 3 months for restaging of her  disease.  Signs/Symptoms Weight changes, if any: 5lbs gain Respiratory complaints, if any: SOB w/ exertion. Hemoptysis, if any: None Pain issues, if any:  8/10 Bilateral upper back pain.  SAFETY ISSUES: Prior radiation? Yes 05/2012 Gamma knife radiosurgery to 2 brain metastases:  03/03/07-03/23/07  The left abdominal wall target was treated to 35 Gy in 14 fractions at 2.5 Gy per fraction with Dr. Lisbeth Renshaw 10/2006  Gamma knife radiosurgery 20 GY to the left superior frontal gyrus,  [place] Dr. Vallarie Mare 10/17/05-11/20/05 The RUL target including mediastinum, spine and right supraclavicular fossa were treated to 45 Gy in 25 fractions at 1.8 Gy per fraction with 3D conformal technique with Dr. Elba Barman Pacemaker/ICD? None  Possible current pregnancy? Postmenopausal- No chances of pregnancy. Is the patient on methotrexate? No  Current Complaints / other details:  Night sweats.

## 2021-06-27 ENCOUNTER — Other Ambulatory Visit: Payer: Self-pay

## 2021-06-27 ENCOUNTER — Ambulatory Visit
Admission: RE | Admit: 2021-06-27 | Discharge: 2021-06-27 | Disposition: A | Discharge status: Home / Self Care | Payer: Medicare Other | Source: Ambulatory Visit | Attending: Radiation Oncology | Admitting: Radiation Oncology

## 2021-06-27 ENCOUNTER — Encounter: Payer: Self-pay | Admitting: Radiation Oncology

## 2021-06-27 VITALS — BP 141/52 | HR 82 | Temp 98.1°F | Resp 19 | Ht 62.0 in | Wt 150.4 lb

## 2021-06-27 DIAGNOSIS — C7802 Secondary malignant neoplasm of left lung: Secondary | ICD-10-CM | POA: Diagnosis not present

## 2021-06-27 DIAGNOSIS — Z79899 Other long term (current) drug therapy: Secondary | ICD-10-CM | POA: Diagnosis not present

## 2021-06-27 DIAGNOSIS — Z923 Personal history of irradiation: Secondary | ICD-10-CM | POA: Insufficient documentation

## 2021-06-27 DIAGNOSIS — Z8673 Personal history of transient ischemic attack (TIA), and cerebral infarction without residual deficits: Secondary | ICD-10-CM | POA: Diagnosis not present

## 2021-06-27 DIAGNOSIS — Z7982 Long term (current) use of aspirin: Secondary | ICD-10-CM | POA: Diagnosis not present

## 2021-06-27 DIAGNOSIS — Z8041 Family history of malignant neoplasm of ovary: Secondary | ICD-10-CM | POA: Diagnosis not present

## 2021-06-27 DIAGNOSIS — C3411 Malignant neoplasm of upper lobe, right bronchus or lung: Secondary | ICD-10-CM | POA: Insufficient documentation

## 2021-06-27 DIAGNOSIS — D6481 Anemia due to antineoplastic chemotherapy: Secondary | ICD-10-CM | POA: Diagnosis not present

## 2021-06-27 DIAGNOSIS — E119 Type 2 diabetes mellitus without complications: Secondary | ICD-10-CM | POA: Insufficient documentation

## 2021-06-27 DIAGNOSIS — C7931 Secondary malignant neoplasm of brain: Secondary | ICD-10-CM | POA: Diagnosis not present

## 2021-06-27 DIAGNOSIS — J432 Centrilobular emphysema: Secondary | ICD-10-CM | POA: Insufficient documentation

## 2021-06-27 DIAGNOSIS — M4854XA Collapsed vertebra, not elsewhere classified, thoracic region, initial encounter for fracture: Secondary | ICD-10-CM | POA: Insufficient documentation

## 2021-06-27 DIAGNOSIS — M47816 Spondylosis without myelopathy or radiculopathy, lumbar region: Secondary | ICD-10-CM | POA: Diagnosis not present

## 2021-06-27 DIAGNOSIS — Z85828 Personal history of other malignant neoplasm of skin: Secondary | ICD-10-CM | POA: Diagnosis not present

## 2021-06-27 DIAGNOSIS — Z800 Family history of malignant neoplasm of digestive organs: Secondary | ICD-10-CM | POA: Insufficient documentation

## 2021-06-27 DIAGNOSIS — Z801 Family history of malignant neoplasm of trachea, bronchus and lung: Secondary | ICD-10-CM | POA: Insufficient documentation

## 2021-06-27 DIAGNOSIS — T451X5A Adverse effect of antineoplastic and immunosuppressive drugs, initial encounter: Secondary | ICD-10-CM | POA: Insufficient documentation

## 2021-06-27 DIAGNOSIS — I10 Essential (primary) hypertension: Secondary | ICD-10-CM | POA: Diagnosis not present

## 2021-06-27 DIAGNOSIS — M47814 Spondylosis without myelopathy or radiculopathy, thoracic region: Secondary | ICD-10-CM | POA: Insufficient documentation

## 2021-06-27 DIAGNOSIS — E876 Hypokalemia: Secondary | ICD-10-CM | POA: Insufficient documentation

## 2021-06-27 DIAGNOSIS — M797 Fibromyalgia: Secondary | ICD-10-CM | POA: Diagnosis not present

## 2021-06-27 DIAGNOSIS — C7951 Secondary malignant neoplasm of bone: Secondary | ICD-10-CM | POA: Diagnosis not present

## 2021-06-27 DIAGNOSIS — Z87891 Personal history of nicotine dependence: Secondary | ICD-10-CM | POA: Insufficient documentation

## 2021-06-28 DIAGNOSIS — C3412 Malignant neoplasm of upper lobe, left bronchus or lung: Secondary | ICD-10-CM | POA: Diagnosis not present

## 2021-06-28 DIAGNOSIS — Z87891 Personal history of nicotine dependence: Secondary | ICD-10-CM | POA: Diagnosis not present

## 2021-07-02 ENCOUNTER — Telehealth: Payer: Self-pay | Admitting: Medical Oncology

## 2021-07-02 ENCOUNTER — Other Ambulatory Visit: Payer: Self-pay | Admitting: Internal Medicine

## 2021-07-02 DIAGNOSIS — C3410 Malignant neoplasm of upper lobe, unspecified bronchus or lung: Secondary | ICD-10-CM

## 2021-07-02 DIAGNOSIS — C3490 Malignant neoplasm of unspecified part of unspecified bronchus or lung: Secondary | ICD-10-CM

## 2021-07-02 MED ORDER — MORPHINE SULFATE 30 MG PO TABS: 30.0000 mg | ORAL_TABLET | ORAL | 0 refills | Status: DC | PRN

## 2021-07-02 MED ORDER — MORPHINE SULFATE ER 60 MG PO TBCR: 60.0000 mg | EXTENDED_RELEASE_TABLET | Freq: Two times a day (BID) | ORAL | 0 refills | Status: DC

## 2021-07-03 ENCOUNTER — Other Ambulatory Visit: Payer: Self-pay

## 2021-07-03 ENCOUNTER — Ambulatory Visit: Admission: RE | Admit: 2021-07-03 | Payer: Medicare Other | Source: Ambulatory Visit | Admitting: Radiation Oncology

## 2021-07-03 DIAGNOSIS — C3411 Malignant neoplasm of upper lobe, right bronchus or lung: Secondary | ICD-10-CM | POA: Insufficient documentation

## 2021-07-03 DIAGNOSIS — C3412 Malignant neoplasm of upper lobe, left bronchus or lung: Secondary | ICD-10-CM | POA: Diagnosis not present

## 2021-07-03 DIAGNOSIS — Z510 Encounter for antineoplastic radiation therapy: Secondary | ICD-10-CM | POA: Insufficient documentation

## 2021-07-03 DIAGNOSIS — Z87891 Personal history of nicotine dependence: Secondary | ICD-10-CM | POA: Diagnosis not present

## 2021-07-18 ENCOUNTER — Telehealth: Payer: Self-pay | Admitting: Internal Medicine

## 2021-07-18 DIAGNOSIS — Z87891 Personal history of nicotine dependence: Secondary | ICD-10-CM | POA: Diagnosis not present

## 2021-07-18 DIAGNOSIS — C3412 Malignant neoplasm of upper lobe, left bronchus or lung: Secondary | ICD-10-CM | POA: Diagnosis not present

## 2021-07-18 NOTE — Telephone Encounter (Signed)
Called patient regarding upcoming September appointments, patient has been called and voicemail was left.

## 2021-07-20 ENCOUNTER — Ambulatory Visit
Admission: RE | Admit: 2021-07-20 | Discharge: 2021-07-20 | Disposition: A | Discharge status: Home / Self Care | Payer: Medicare Other | Source: Ambulatory Visit | Attending: Radiation Oncology | Admitting: Radiation Oncology

## 2021-07-20 ENCOUNTER — Other Ambulatory Visit: Payer: Self-pay

## 2021-07-20 DIAGNOSIS — C3412 Malignant neoplasm of upper lobe, left bronchus or lung: Secondary | ICD-10-CM | POA: Diagnosis not present

## 2021-07-20 DIAGNOSIS — C3411 Malignant neoplasm of upper lobe, right bronchus or lung: Secondary | ICD-10-CM | POA: Diagnosis not present

## 2021-07-20 DIAGNOSIS — Z510 Encounter for antineoplastic radiation therapy: Secondary | ICD-10-CM | POA: Insufficient documentation

## 2021-07-20 DIAGNOSIS — Z87891 Personal history of nicotine dependence: Secondary | ICD-10-CM | POA: Diagnosis not present

## 2021-07-20 LAB — RAD ONC ARIA SESSION SUMMARY
Course Elapsed Days: 0
Plan Fractions Treated to Date: 1
Plan Prescribed Dose Per Fraction: 18 Gy
Plan Total Fractions Prescribed: 3
Plan Total Prescribed Dose: 54 Gy
Reference Point Dosage Given to Date: 18 Gy
Reference Point Session Dosage Given: 18 Gy
Session Number: 1

## 2021-07-23 ENCOUNTER — Ambulatory Visit: Payer: Medicare Other | Admitting: Radiation Oncology

## 2021-07-24 ENCOUNTER — Ambulatory Visit: Payer: Medicare Other | Admitting: Radiation Oncology

## 2021-07-25 ENCOUNTER — Other Ambulatory Visit: Payer: Self-pay

## 2021-07-25 ENCOUNTER — Ambulatory Visit
Admission: RE | Admit: 2021-07-25 | Discharge: 2021-07-25 | Disposition: A | Discharge status: Home / Self Care | Payer: Medicare Other | Source: Ambulatory Visit | Attending: Radiation Oncology | Admitting: Radiation Oncology

## 2021-07-25 DIAGNOSIS — C3411 Malignant neoplasm of upper lobe, right bronchus or lung: Secondary | ICD-10-CM | POA: Diagnosis not present

## 2021-07-25 DIAGNOSIS — Z510 Encounter for antineoplastic radiation therapy: Secondary | ICD-10-CM | POA: Diagnosis not present

## 2021-07-25 LAB — RAD ONC ARIA SESSION SUMMARY
Course Elapsed Days: 5
Plan Fractions Treated to Date: 2
Plan Prescribed Dose Per Fraction: 18 Gy
Plan Total Fractions Prescribed: 3
Plan Total Prescribed Dose: 54 Gy
Reference Point Dosage Given to Date: 36 Gy
Reference Point Session Dosage Given: 18 Gy
Session Number: 2

## 2021-07-27 ENCOUNTER — Encounter: Payer: Self-pay | Admitting: Radiation Oncology

## 2021-07-27 ENCOUNTER — Other Ambulatory Visit: Payer: Self-pay

## 2021-07-27 ENCOUNTER — Ambulatory Visit
Admission: RE | Admit: 2021-07-27 | Discharge: 2021-07-27 | Disposition: A | Discharge status: Home / Self Care | Payer: Medicare Other | Source: Ambulatory Visit | Attending: Radiation Oncology | Admitting: Radiation Oncology

## 2021-07-27 DIAGNOSIS — C3411 Malignant neoplasm of upper lobe, right bronchus or lung: Secondary | ICD-10-CM | POA: Diagnosis not present

## 2021-07-27 DIAGNOSIS — Z87891 Personal history of nicotine dependence: Secondary | ICD-10-CM | POA: Diagnosis not present

## 2021-07-27 DIAGNOSIS — C3412 Malignant neoplasm of upper lobe, left bronchus or lung: Secondary | ICD-10-CM | POA: Diagnosis not present

## 2021-07-27 DIAGNOSIS — Z510 Encounter for antineoplastic radiation therapy: Secondary | ICD-10-CM | POA: Diagnosis not present

## 2021-07-27 LAB — RAD ONC ARIA SESSION SUMMARY
Course Elapsed Days: 7
Plan Fractions Treated to Date: 3
Plan Prescribed Dose Per Fraction: 18 Gy
Plan Total Fractions Prescribed: 3
Plan Total Prescribed Dose: 54 Gy
Reference Point Dosage Given to Date: 54 Gy
Reference Point Session Dosage Given: 18 Gy
Session Number: 3

## 2021-07-30 ENCOUNTER — Encounter: Payer: Self-pay | Admitting: Internal Medicine

## 2021-07-30 NOTE — Progress Notes (Addendum)
                                                                                                                                                             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   Plan:  The patient will receive a call in about one month from the radiation oncology department. She will continue follow up with Dr. Julien Nordmann as well.   ________________________________________________    Carola Rhine, [facility]

## 2021-07-31 ENCOUNTER — Other Ambulatory Visit: Payer: Self-pay | Admitting: Internal Medicine

## 2021-07-31 ENCOUNTER — Other Ambulatory Visit: Payer: Self-pay | Admitting: Physician Assistant

## 2021-07-31 ENCOUNTER — Telehealth: Payer: Self-pay | Admitting: Medical Oncology

## 2021-07-31 DIAGNOSIS — C3410 Malignant neoplasm of upper lobe, unspecified bronchus or lung: Secondary | ICD-10-CM

## 2021-07-31 DIAGNOSIS — C3490 Malignant neoplasm of unspecified part of unspecified bronchus or lung: Secondary | ICD-10-CM

## 2021-07-31 MED ORDER — MORPHINE SULFATE ER 60 MG PO TBCR: 60.0000 mg | EXTENDED_RELEASE_TABLET | Freq: Two times a day (BID) | ORAL | 0 refills | Status: DC

## 2021-07-31 NOTE — Telephone Encounter (Signed)
Requests refill for [place] 60mg  . I called pt back because part of her conversation was inaudible after she asked to refill the [facility]. Refill message sent to [facility].

## 2021-08-01 ENCOUNTER — Other Ambulatory Visit: Payer: Self-pay | Admitting: Medical Oncology

## 2021-08-01 DIAGNOSIS — C3411 Malignant neoplasm of upper lobe, right bronchus or lung: Secondary | ICD-10-CM

## 2021-08-01 DIAGNOSIS — R110 Nausea: Secondary | ICD-10-CM

## 2021-08-01 MED ORDER — ONDANSETRON HCL 8 MG PO TABS: ORAL_TABLET | ORAL | 1 refills | Status: DC

## 2021-08-01 NOTE — Telephone Encounter (Signed)
Requests zofran refill.

## 2021-08-09 ENCOUNTER — Other Ambulatory Visit: Payer: Self-pay | Admitting: Family Medicine

## 2021-08-22 ENCOUNTER — Telehealth: Payer: Self-pay | Admitting: Medical Oncology

## 2021-08-22 NOTE — Telephone Encounter (Signed)
Danielle [name] wants to schedule her scan now. It is expected on 09/08. Message sent to schedulers to call dtr , Danielle [name], to schedule it.

## 2021-08-29 NOTE — Progress Notes (Signed)
  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    Impression/Plan: 1. 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  I was unable to reach the patient but left a voicemail and on the message, I discussed that we would be happy to continue to follow her as needed, but she will also continue to follow up with Dr. Julien Nordmann in medical oncology.      Carola Rhine, PAC

## 2021-09-03 ENCOUNTER — Ambulatory Visit
Admission: RE | Admit: 2021-09-03 | Discharge: 2021-09-03 | Disposition: A | Discharge status: Home / Self Care | Payer: Medicare Other | Source: Ambulatory Visit | Attending: Radiation Oncology | Admitting: Radiation Oncology

## 2021-09-03 DIAGNOSIS — C3411 Malignant neoplasm of upper lobe, right bronchus or lung: Secondary | ICD-10-CM | POA: Insufficient documentation

## 2021-09-04 ENCOUNTER — Other Ambulatory Visit: Payer: Self-pay | Admitting: Physician Assistant

## 2021-09-04 ENCOUNTER — Telehealth: Payer: Self-pay

## 2021-09-04 DIAGNOSIS — C3410 Malignant neoplasm of upper lobe, unspecified bronchus or lung: Secondary | ICD-10-CM

## 2021-09-04 DIAGNOSIS — C3490 Malignant neoplasm of unspecified part of unspecified bronchus or lung: Secondary | ICD-10-CM

## 2021-09-04 MED ORDER — MORPHINE SULFATE ER 60 MG PO TBCR: 60.0000 mg | EXTENDED_RELEASE_TABLET | Freq: Two times a day (BID) | ORAL | 0 refills | Status: DC

## 2021-09-04 NOTE — Telephone Encounter (Signed)
Pt request refill of Morphine 60mg 

## 2021-09-05 ENCOUNTER — Ambulatory Visit: Payer: Medicare (Managed Care)

## 2021-09-08 ENCOUNTER — Other Ambulatory Visit: Payer: Self-pay | Admitting: Physician Assistant

## 2021-09-08 ENCOUNTER — Other Ambulatory Visit: Payer: Self-pay | Admitting: Family Medicine

## 2021-09-08 DIAGNOSIS — C3411 Malignant neoplasm of upper lobe, right bronchus or lung: Secondary | ICD-10-CM

## 2021-09-10 ENCOUNTER — Encounter: Payer: Self-pay | Admitting: Internal Medicine

## 2021-09-12 ENCOUNTER — Other Ambulatory Visit: Payer: Self-pay | Admitting: Physician Assistant

## 2021-09-12 DIAGNOSIS — C3410 Malignant neoplasm of upper lobe, unspecified bronchus or lung: Secondary | ICD-10-CM

## 2021-09-14 ENCOUNTER — Inpatient Hospital Stay: Payer: Medicare Other

## 2021-09-14 ENCOUNTER — Other Ambulatory Visit: Payer: Self-pay

## 2021-09-14 ENCOUNTER — Encounter: Payer: Self-pay | Admitting: Family Medicine

## 2021-09-14 ENCOUNTER — Ambulatory Visit (INDEPENDENT_AMBULATORY_CARE_PROVIDER_SITE_OTHER): Payer: Medicare Other | Admitting: Family Medicine

## 2021-09-14 ENCOUNTER — Ambulatory Visit (HOSPITAL_COMMUNITY)
Admission: RE | Admit: 2021-09-14 | Discharge: 2021-09-14 | Disposition: A | Discharge status: Home / Self Care | Payer: Medicare Other | Source: Ambulatory Visit | Attending: Internal Medicine | Admitting: Internal Medicine

## 2021-09-14 ENCOUNTER — Inpatient Hospital Stay: Payer: Medicare Other | Attending: Physician Assistant

## 2021-09-14 VITALS — BP 140/60 | HR 85 | Temp 98.0°F | Ht 62.75 in | Wt 147.0 lb

## 2021-09-14 DIAGNOSIS — I152 Hypertension secondary to endocrine disorders: Secondary | ICD-10-CM

## 2021-09-14 DIAGNOSIS — Z0000 Encounter for general adult medical examination without abnormal findings: Secondary | ICD-10-CM | POA: Diagnosis not present

## 2021-09-14 DIAGNOSIS — E119 Type 2 diabetes mellitus without complications: Secondary | ICD-10-CM | POA: Diagnosis not present

## 2021-09-14 DIAGNOSIS — E1169 Type 2 diabetes mellitus with other specified complication: Secondary | ICD-10-CM | POA: Diagnosis not present

## 2021-09-14 DIAGNOSIS — R110 Nausea: Secondary | ICD-10-CM | POA: Insufficient documentation

## 2021-09-14 DIAGNOSIS — N281 Cyst of kidney, acquired: Secondary | ICD-10-CM | POA: Diagnosis not present

## 2021-09-14 DIAGNOSIS — J449 Chronic obstructive pulmonary disease, unspecified: Secondary | ICD-10-CM

## 2021-09-14 DIAGNOSIS — C3490 Malignant neoplasm of unspecified part of unspecified bronchus or lung: Secondary | ICD-10-CM | POA: Insufficient documentation

## 2021-09-14 DIAGNOSIS — J90 Pleural effusion, not elsewhere classified: Secondary | ICD-10-CM | POA: Diagnosis not present

## 2021-09-14 DIAGNOSIS — R5383 Other fatigue: Secondary | ICD-10-CM | POA: Insufficient documentation

## 2021-09-14 DIAGNOSIS — Z923 Personal history of irradiation: Secondary | ICD-10-CM | POA: Insufficient documentation

## 2021-09-14 DIAGNOSIS — C3411 Malignant neoplasm of upper lobe, right bronchus or lung: Secondary | ICD-10-CM | POA: Diagnosis not present

## 2021-09-14 DIAGNOSIS — Z9221 Personal history of antineoplastic chemotherapy: Secondary | ICD-10-CM | POA: Insufficient documentation

## 2021-09-14 DIAGNOSIS — E1159 Type 2 diabetes mellitus with other circulatory complications: Secondary | ICD-10-CM | POA: Diagnosis not present

## 2021-09-14 DIAGNOSIS — J439 Emphysema, unspecified: Secondary | ICD-10-CM | POA: Diagnosis not present

## 2021-09-14 DIAGNOSIS — E785 Hyperlipidemia, unspecified: Secondary | ICD-10-CM | POA: Diagnosis not present

## 2021-09-14 DIAGNOSIS — C3410 Malignant neoplasm of upper lobe, unspecified bronchus or lung: Secondary | ICD-10-CM | POA: Insufficient documentation

## 2021-09-14 DIAGNOSIS — Z95828 Presence of other vascular implants and grafts: Secondary | ICD-10-CM

## 2021-09-14 LAB — POCT GLYCOSYLATED HEMOGLOBIN (HGB A1C): Hemoglobin A1C: 5.5 % (ref 4.0–5.6)

## 2021-09-14 LAB — CBC WITH DIFFERENTIAL/PLATELET
Abs Immature Granulocytes: 0.04 10*3/uL (ref 0.00–0.07)
Basophils Absolute: 0.1 10*3/uL (ref 0.0–0.1)
Basophils Relative: 0 %
Eosinophils Absolute: 0.1 10*3/uL (ref 0.0–0.5)
Eosinophils Relative: 1 %
HCT: 41.1 % (ref 36.0–46.0)
Hemoglobin: 13.7 g/dL (ref 12.0–15.0)
Immature Granulocytes: 0 %
Lymphocytes Relative: 23 %
Lymphs Abs: 2.6 10*3/uL (ref 0.7–4.0)
MCH: 30.6 pg (ref 26.0–34.0)
MCHC: 33.3 g/dL (ref 30.0–36.0)
MCV: 91.7 fL (ref 80.0–100.0)
Monocytes Absolute: 1.1 10*3/uL — ABNORMAL HIGH (ref 0.1–1.0)
Monocytes Relative: 10 %
Neutro Abs: 7.5 10*3/uL (ref 1.7–7.7)
Neutrophils Relative %: 66 %
Platelets: 384 10*3/uL (ref 150–400)
RBC: 4.48 MIL/uL (ref 3.87–5.11)
RDW: 12.8 % (ref 11.5–15.5)
WBC: 11.4 10*3/uL — ABNORMAL HIGH (ref 4.0–10.5)
nRBC: 0 % (ref 0.0–0.2)

## 2021-09-14 LAB — COMPREHENSIVE METABOLIC PANEL
ALT: 12 U/L (ref 0–44)
AST: 17 U/L (ref 15–41)
Albumin: 4.1 g/dL (ref 3.5–5.0)
Alkaline Phosphatase: 73 U/L (ref 38–126)
Anion gap: 5 (ref 5–15)
BUN: 22 mg/dL (ref 8–23)
CO2: 27 mmol/L (ref 22–32)
Calcium: 10.2 mg/dL (ref 8.9–10.3)
Chloride: 106 mmol/L (ref 98–111)
Creatinine, Ser: 0.81 mg/dL (ref 0.44–1.00)
GFR, Estimated: >60 mL/min (ref >60)
Glucose, Bld: 101 mg/dL — ABNORMAL HIGH (ref 70–99)
Potassium: 4.3 mmol/L (ref 3.5–5.1)
Sodium: 138 mmol/L (ref 135–145)
Total Bilirubin: 0.4 mg/dL (ref 0.3–1.2)
Total Protein: 7.5 g/dL (ref 6.5–8.1)

## 2021-09-14 LAB — HM DIABETES FOOT EXAM

## 2021-09-14 MED ORDER — SODIUM CHLORIDE 0.9% FLUSH
10.0000 mL | INTRAVENOUS | Status: DC | PRN
  Administered 2021-09-14: 10 mL via INTRAVENOUS

## 2021-09-14 MED ORDER — HEPARIN SOD (PORK) LOCK FLUSH 100 UNIT/ML IV SOLN
INTRAVENOUS | Status: AC
  Filled 2021-09-14: qty 5

## 2021-09-14 MED ORDER — IOHEXOL 300 MG/ML  SOLN
100.0000 mL | Freq: Once | INTRAMUSCULAR | Status: AC | PRN
  Administered 2021-09-14: 100 mL via INTRAVENOUS

## 2021-09-14 NOTE — Assessment & Plan Note (Addendum)
Followed by oncology. Active treatment with radiation.  last chemo 6 months ago.  New lesion in left lung.. s/p radiation x 3 .. Has imaging to reassess today.

## 2021-09-14 NOTE — Assessment & Plan Note (Signed)
Chronic, previously well controlled with diet  Due for reevaluation today.

## 2021-09-14 NOTE — Assessment & Plan Note (Signed)
Chronic, stable control Albuterol inhaler as needed.

## 2021-09-14 NOTE — Progress Notes (Signed)
Patient ID: Danielle [name], female    DOB: 26-Apr-1950, 71 y.o.   MRN: [id number]  This visit was conducted in person.  BP (!) 140/60   Pulse 85   Temp 98 F (36.7 C) (Oral)   Ht 5' 2.75" (1.594 m)   Wt 147 lb (66.7 kg)   SpO2 99%   BMI 26.25 kg/m    CC:  Chief Complaint  Patient presents with   Medicare Wellness    Subjective:   HPI: Danielle [name] is a 71 y.o. female presenting on 09/14/2021 for Medicare Wellness  The patient presents for annual medicare wellness, complete physical and review of chronic health problems. He/She also has the following acute concerns today:  I have personally reviewed the Medicare Annual Wellness questionnaire and have noted 1. The patient's medical and social history 2. Their use of alcohol, tobacco or illicit drugs 3. Their current medications and supplements 4. The patient's functional ability including ADL's, fall risks, home safety risks and hearing or visual             impairment. 5. Diet and physical activities 6. Evidence for depression or mood disorders 7.         Updated provider list. Cognitive evaluation was performed and recorded on pt medicare questionnaire form. The patients weight, height, BMI and visual acuity have been recorded in the chart   I have made referrals, counseling and provided education to the patient based review of the above and I have provided the pt with a written personalized care plan for preventive services. .  Documentation of this information was scanned into the electronic record under the media tab.   Advance directives and end of life planning reviewed in detail with patient and documented in EMR. Patient given handout on advance care directives if needed. HCPOA and living will updated if needed.  No falls in last 12 months.  Hearing Screening  Method: Audiometry   500Hz  1000Hz  2000Hz  4000Hz   Right ear 20 20 20 20   Left ear 20 20 20 20   Vision Screening - Comments:: Wears Glasses-Eye  Exam with Dr. Kerin Ransom 08/2021  [place] Office Visit from 09/14/2021 in [place] at [facility] Total Score 0      Diabetes:  Due for re-eval. Using medications without difficulties: Hypoglycemic episodes: Hyperglycemic episodes: Feet problems: none Blood Sugars averaging: not checking eye exam within last year: yes  Hypertension:   Borderline control in office today on amlodipine 10 mg p.o. daily, lisinopril 20 mg p.o. daily, Lasix 20 mg p.o. daily as needed for swelling BP Readings from Last 3 Encounters:  09/14/21 (!) 140/60  06/27/21 (!) 141/52  06/18/21 119/63  Using medication without problems or lightheadedness:  none Chest pain with exertion: none Edema: stable Short of breath: stable Average home BPs: at home 120/70s Other issues:  Elevated Cholesterol: LDL at goal less than 70 on atorvastatin 40 mg p.o. daily Lab Results  Component Value Date   CHOL 114 09/04/2020   HDL 38.00 (L) 09/04/2020   LDLCALC 54 09/04/2020   LDLDIRECT 105.5 08/06/2013   TRIG 112.0 09/04/2020   CHOLHDL 3 09/04/2020  Using medications without problems: Muscle aches:  Diet compliance: moderate Exercise: minimal Other complaints:   Lung cancer, active: last chemo 6 months ago.   New lesion in left lung.. s/p radiation x 3 .. Has imaging to reassess today.      Relevant past medical, surgical, family and social history reviewed and  updated as indicated. Interim medical history since our last visit reviewed. Allergies and medications reviewed and updated. Outpatient Medications Prior to Visit  Medication Sig Dispense Refill   albuterol (VENTOLIN HFA) 108 (90 Base) MCG/ACT inhaler Sig: TAKE 2 PUFFS BY MOUTH EVERY 6 HOURS AS NEEDED FOR WHEEZE OR SHORTNESS OF BREATH 18 each 5   alendronate (FOSAMAX) 70 MG tablet TAKE 1 TABLET BY MOUTH EVERY 7 (SEVEN) DAYS. TAKE WITH A FULL GLASS OF WATER ON AN EMPTY STOMACH. 12 tablet 1   amLODipine (NORVASC) 10 MG tablet TAKE 1  TABLET BY MOUTH EVERY DAY 90 tablet 1   aspirin 325 MG EC tablet TAKE 1 TABLET BY MOUTH EVERY DAY 90 tablet 0   atorvastatin (LIPITOR) 40 MG tablet TAKE 1 TABLET BY MOUTH EVERY DAY AT 6 PM 90 tablet 2   Cholecalciferol (VITAMIN D3) 50 MCG (2000 UT) TABS Take by mouth.     diphenhydrAMINE (BENADRYL) 25 MG tablet Take 1 tablet (25 mg total) by mouth as directed. 2 hours before CT scan. 5 tablet 0   FeFum-FePoly-FA-B Cmp-C-Biot (FOLIVANE-PLUS) CAPS TAKE 1 CAPSULE BY MOUTH EVERY DAY IN THE MORNING 90 capsule 3   fluticasone (FLONASE) 50 MCG/ACT nasal spray Place 1-2 sprays into both nostrils daily as needed. 16 g 5   folic acid (FOLVITE) 1 MG tablet TAKE 1 TABLET BY MOUTH EVERY DAY 90 tablet 1   furosemide (LASIX) 20 MG tablet TAKE 1 TABLET BY MOUTH EVERY DAY AS NEEDED FOR SWELLING 20 tablet 1   INCRUSE ELLIPTA 62.5 MCG/ACT AEPB INHALE 1 PUFF BY MOUTH EVERY DAY 30 each 5   lidocaine-prilocaine (EMLA) cream APPLY TO AFFECTED AREA FOR 1 DOSE 30 g 2   lisinopril (ZESTRIL) 20 MG tablet TAKE 2 TABLETS (40 MG TOTAL) BY MOUTH DAILY. 180 tablet 0   morphine (MS CONTIN) 60 MG 12 hr tablet Take 1 tablet (60 mg total) by mouth 2 (two) times daily. 60 tablet 0   morphine (MSIR) 30 MG tablet Take 1 tablet (30 mg total) by mouth every 4 (four) hours as needed (breakthrough pain). 60 tablet 0   Olopatadine HCl 0.2 % SOLN INSTILL 1 DROP INTO AFFECTED EYE(S) EVERY DAY 2.5 mL 1   ondansetron (ZOFRAN) 8 MG tablet TAKE 1 TABLET BY MOUTH EVERY 8 HOURS AS NEEDED FOR NAUSEA AND VOMITING 20 tablet 1   predniSONE (DELTASONE) 50 MG tablet 1 tablet by mouth 13 hours ,7 hours and 1 hour  before CT scan 3 tablet 12   prochlorperazine (COMPAZINE) 10 MG tablet Take 1 tablet (10 mg total) by mouth every 6 (six) hours as needed. 30 tablet 2   triamcinolone (KENALOG) 0.025 % ointment APPLY TO AFFECTED AREA TWICE A DAY 30 g 0   No facility-administered medications prior to visit.     Per HPI unless specifically indicated in ROS  section below Review of Systems  Constitutional:  Negative for fatigue and fever.  HENT:  Negative for congestion.   Eyes:  Negative for pain.  Respiratory:  Negative for cough and shortness of breath.   Cardiovascular:  Negative for chest pain, palpitations and leg swelling.  Gastrointestinal:  Negative for abdominal pain.  Genitourinary:  Negative for dysuria and vaginal bleeding.  Musculoskeletal:  Negative for back pain.  Neurological:  Negative for syncope, light-headedness and headaches.  Psychiatric/Behavioral:  Negative for dysphoric mood.    Objective:  BP (!) 140/60   Pulse 85   Temp 98 F (36.7 C) (Oral)   Ht  5' 2.75" (1.594 m)   Wt 147 lb (66.7 kg)   SpO2 99%   BMI 26.25 kg/m   Wt Readings from Last 3 Encounters:  09/14/21 147 lb (66.7 kg)  06/27/21 150 lb 6.4 oz (68.2 kg)  06/18/21 141 lb 8 oz (64.2 kg)      Physical Exam Constitutional:      General: She is not in acute distress.    Appearance: Normal appearance. She is well-developed. She is not ill-appearing or toxic-appearing.  HENT:     Head: Normocephalic.     Right Ear: Hearing, tympanic membrane, ear canal and external ear normal. Tympanic membrane is not erythematous, retracted or bulging.     Left Ear: Hearing, tympanic membrane, ear canal and external ear normal. Tympanic membrane is not erythematous, retracted or bulging.     Nose: No mucosal edema or rhinorrhea.     Right Sinus: No maxillary sinus tenderness or frontal sinus tenderness.     Left Sinus: No maxillary sinus tenderness or frontal sinus tenderness.     Mouth/Throat:     Pharynx: Uvula midline.  Eyes:     General: Lids are normal. Lids are everted, no foreign bodies appreciated.     Conjunctiva/sclera: Conjunctivae normal.     Pupils: Pupils are equal, round, and reactive to light.  Neck:     Thyroid: No thyroid mass or thyromegaly.     Vascular: No carotid bruit.     Trachea: Trachea normal.  Cardiovascular:     Rate and Rhythm:  Normal rate and regular rhythm.     Pulses: Normal pulses.     Heart sounds: Normal heart sounds, S1 normal and S2 normal. No murmur heard.    No friction rub. No gallop.  Pulmonary:     Effort: Pulmonary effort is normal. No tachypnea or respiratory distress.     Breath sounds: Rhonchi present. No decreased breath sounds, wheezing or rales.  Abdominal:     General: Bowel sounds are normal.     Palpations: Abdomen is soft.     Tenderness: There is no abdominal tenderness.  Musculoskeletal:     Cervical back: Normal range of motion and neck supple.  Skin:    General: Skin is warm and dry.     Findings: No rash.  Neurological:     Mental Status: She is alert.  Psychiatric:        Mood and Affect: Mood is not anxious or depressed.        Speech: Speech normal.        Behavior: Behavior normal. Behavior is cooperative.        Thought Content: Thought content normal.        Judgment: Judgment normal.       Diabetic foot exam: Normal inspection No skin breakdown No calluses  Normal DP pulses Normal sensation to light touch and monofilament Nails normal  Results for orders placed or performed in visit on 07/27/21  Rad Onc [place] Session Summary  Result Value Ref Range   Course ID [email or web address]    Course Intent Unknown    Course Start Date 07/03/2021 11:26 AM    Session Number 3    Course First Treatment Date 07/20/2021  1:44 PM    Course Last Treatment Date 07/27/2021  1:52 PM    Course Elapsed Days 7    Reference Point ID Lung_L_SBRT    Reference Point Dosage Given to Date 28 Gy   Reference Point Session Dosage Given 18 Gy  Plan ID Lung_Lt_SBRT    Plan Fractions Treated to Date 3    Plan Total Fractions Prescribed 3    Plan Prescribed Dose Per Fraction 18 Gy   Plan Total Prescribed Dose 54.000000 Gy   Plan Primary Reference Point Lung_L_SBRT    *Note: Due to a large number of results and/or encounters for the requested time period, some results have not been displayed. A  complete set of results can be found in Results Review.     COVID 19 screen:  No recent travel or known exposure to COVID19 The patient denies respiratory symptoms of COVID 19 at this time. The importance of social distancing was discussed today.   Assessment and Plan The patient's preventative maintenance and recommended screening tests for an annual wellness exam were reviewed in full today. Brought up to date unless services declined.  Counselled on the importance of diet, exercise, and its role in overall health and mortality. The patient's FH and SH was reviewed, including their home life, tobacco status, and drug and alcohol status.   Will get flu shot at [facility] x 3 Hep C screening - completed HIV screening - completed A1C - completed Mammogram/ breast exam - declined PAP smear - declined Shingles - contraindicated Eye exam - pt reported exam .. 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 Active treatment with radiation.  last chemo 6 months ago.  New lesion in left lung.. s/p radiation x 3 .. Has imaging to reassess today.      Diabetes mellitus with no complication (HCC) (Chronic)    Chronic, previously well controlled with diet  Due for reevaluation today.      Relevant Orders   POCT glycosylated hemoglobin (Hb A1C) (Completed)   Hemoglobin A1c   Hyperlipidemia associated with type 2 diabetes mellitus (HCC) (Chronic)    Stable, chronic.  Continue current medication.   Atorvastatin 40 mg p.o. daily      Relevant Orders   Lipid panel   Hypertension associated with diabetes (HCC) (Chronic)    Stable, chronic.  Continue current medication.   amlodipine 10 mg p.o. daily  lisinopril 20 mg p.o. daily  Lasix 20 mg p.o. daily as needed for swelling      COPD, moderate (HCC)    Chronic, stable control Albuterol inhaler as needed.       Other Visit Diagnoses     Medicare annual wellness visit, subsequent    -  Primary       [name], MD

## 2021-09-14 NOTE — Assessment & Plan Note (Signed)
Stable, chronic.  Continue current medication.   amlodipine 10 mg p.o. daily  lisinopril 20 mg p.o. daily  Lasix 20 mg p.o. daily as needed for swelling

## 2021-09-14 NOTE — Assessment & Plan Note (Signed)
Stable, chronic.  Continue current medication.   Atorvastatin 40 mg p.o. daily

## 2021-09-18 ENCOUNTER — Other Ambulatory Visit: Payer: Self-pay

## 2021-09-18 ENCOUNTER — Inpatient Hospital Stay (HOSPITAL_BASED_OUTPATIENT_CLINIC_OR_DEPARTMENT_OTHER): Payer: Medicare Other | Admitting: Internal Medicine

## 2021-09-18 VITALS — BP 143/50 | HR 80 | Temp 97.4°F | Resp 18 | Ht 62.75 in | Wt 149.6 lb

## 2021-09-18 DIAGNOSIS — R5383 Other fatigue: Secondary | ICD-10-CM | POA: Diagnosis not present

## 2021-09-18 DIAGNOSIS — Z9221 Personal history of antineoplastic chemotherapy: Secondary | ICD-10-CM | POA: Diagnosis not present

## 2021-09-18 DIAGNOSIS — R110 Nausea: Secondary | ICD-10-CM | POA: Diagnosis not present

## 2021-09-18 DIAGNOSIS — C3410 Malignant neoplasm of upper lobe, unspecified bronchus or lung: Secondary | ICD-10-CM | POA: Diagnosis not present

## 2021-09-18 DIAGNOSIS — C3490 Malignant neoplasm of unspecified part of unspecified bronchus or lung: Secondary | ICD-10-CM

## 2021-09-18 DIAGNOSIS — Z923 Personal history of irradiation: Secondary | ICD-10-CM | POA: Diagnosis not present

## 2021-09-18 NOTE — Addendum Note (Signed)
Addended byEliezer Lofts E on: 09/18/2021 11:08 AM   Modules accepted: Orders

## 2021-09-18 NOTE — Progress Notes (Signed)
[facility] [phone]   [phone]  OFFICE PROGRESS NOTE  Danielle Sanders, MD [place] [address]  DIAGNOSIS: Metastatic non-small cell lung cancer initially diagnosed as locally advanced stage IIIB with a right Pancoast tumor involving the vertebral body as well as prominent canal invasion with spinal cord compression in October 2007. The patient also has metastatic disease to the brain in April 2008.  PRIOR THERAPY: Status post concurrent chemoradiation with weekly carboplatin and paclitaxel, last dose was given November 18, 2005. 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 Status post gamma knife radiotherapy to a solitary brain lesion located in the superior frontal area of the brain at [facility] in April of 2008. Status post palliative radiotherapy to the lateral abdominal wall metastatic lesion under the care of Dr. Lisbeth Renshaw, completed March of 2009. Status post 6 cycles of systemic chemotherapy with carboplatin and Alimta. Last dose was given July 26, 2007 with disease stabilization. Gamma knife stereotactic radiotherapy to 2 brain lesions one involving the right frontal dural based as well as right parietal lesion performed on 05/07/2012 under the care of Dr. Vallarie Mare at [facility]. Maintenance systemic chemotherapy with Alimta 500 MG/M2 every 3 weeks, status post 233 cycles.  Treatment is currently on hold since January 30, 2021.  SBRT to enlarging left upper lobe lung nodule under the care of Dr. Lisbeth Renshaw completed on July 27, 2021.  CURRENT THERAPY: Observation.  INTERVAL HISTORY: Danielle [name] 71 y.o. female returns to the clinic today for follow-up visit accompanied by a family member.  The patient is feeling fine today with no concerning complaints except for mild fatigue.  She denied having any current chest pain, shortness of  breath, cough or hemoptysis.  She has no nausea, vomiting, diarrhea or constipation.  She has no headache or visual changes.  She tolerated the palliative radiotherapy to the left upper lobe lung nodule fairly well.  The patient is here today for evaluation with repeat CT scan of the chest, abdomen and pelvis for restaging of her disease.  MEDICAL HISTORY: Past Medical History:  Diagnosis Date   Anemia    Antineoplastic chemotherapy induced anemia 01/23/2016   Carcinomas, basal cell 01/22/2019   two places on face   CVA (cerebral vascular accident) ([place]) 04/2014   pt states had 2 cva's within 2 wks   Diabetes mellitus without complication ([place])    DJD (degenerative joint disease), cervical    Fibromyalgia    H/O: pneumonia    History of tobacco abuse quit 10/08   on nicotine patch   Hypertension    Hypokalemia    Lung cancer ([place]) dx'd 09/2005   hx of non-small cell: metastasis to brain. had chemo and radiation for lung ca   Thrush 12/11/2010    ALLERGIES:  is allergic to contrast media [iodinated contrast media], iohexol, sulfa drugs cross reactors, and co-trimoxazole injection [sulfamethoxazole-trimethoprim].  MEDICATIONS:  Current Outpatient Medications  Medication Sig Dispense Refill   albuterol (VENTOLIN HFA) 108 (90 Base) MCG/ACT inhaler Sig: TAKE 2 PUFFS BY MOUTH EVERY 6 HOURS AS NEEDED FOR WHEEZE OR SHORTNESS OF BREATH 18 each 5   alendronate (FOSAMAX) 70 MG tablet TAKE 1 TABLET BY MOUTH EVERY 7 (SEVEN) DAYS. TAKE WITH A FULL GLASS OF WATER ON AN EMPTY STOMACH. 12 tablet 1   amLODipine (NORVASC) 10 MG tablet TAKE 1 TABLET BY MOUTH EVERY DAY  90 tablet 1   aspirin 325 MG EC tablet TAKE 1 TABLET BY MOUTH EVERY DAY 90 tablet 0   atorvastatin (LIPITOR) 40 MG tablet TAKE 1 TABLET BY MOUTH EVERY DAY AT 6 PM 90 tablet 2   Cholecalciferol (VITAMIN D3) 50 MCG (2000 UT) TABS Take by mouth.     diphenhydrAMINE (BENADRYL) 25 MG tablet Take 1 tablet (25 mg total) by mouth as directed. 2  hours before CT scan. 5 tablet 0   FeFum-FePoly-FA-B Cmp-C-Biot (FOLIVANE-PLUS) CAPS TAKE 1 CAPSULE BY MOUTH EVERY DAY IN THE MORNING 90 capsule 3   fluticasone (FLONASE) 50 MCG/ACT nasal spray Place 1-2 sprays into both nostrils daily as needed. 16 g 5   folic acid (FOLVITE) 1 MG tablet TAKE 1 TABLET BY MOUTH EVERY DAY 90 tablet 1   furosemide (LASIX) 20 MG tablet TAKE 1 TABLET BY MOUTH EVERY DAY AS NEEDED FOR SWELLING 20 tablet 1   INCRUSE ELLIPTA 62.5 MCG/ACT AEPB INHALE 1 PUFF BY MOUTH EVERY DAY 30 each 5   lidocaine-prilocaine (EMLA) cream APPLY TO AFFECTED AREA FOR 1 DOSE 30 g 2   lisinopril (ZESTRIL) 20 MG tablet TAKE 2 TABLETS (40 MG TOTAL) BY MOUTH DAILY. 180 tablet 0   morphine (MS CONTIN) 60 MG 12 hr tablet Take 1 tablet (60 mg total) by mouth 2 (two) times daily. 60 tablet 0   morphine (MSIR) 30 MG tablet Take 1 tablet (30 mg total) by mouth every 4 (four) hours as needed (breakthrough pain). 60 tablet 0   Olopatadine HCl 0.2 % SOLN INSTILL 1 DROP INTO AFFECTED EYE(S) EVERY DAY 2.5 mL 1   ondansetron (ZOFRAN) 8 MG tablet TAKE 1 TABLET BY MOUTH EVERY 8 HOURS AS NEEDED FOR NAUSEA AND VOMITING 20 tablet 1   predniSONE (DELTASONE) 50 MG tablet 1 tablet by mouth 13 hours ,7 hours and 1 hour  before CT scan 3 tablet 12   prochlorperazine (COMPAZINE) 10 MG tablet Take 1 tablet (10 mg total) by mouth every 6 (six) hours as needed. 30 tablet 2   triamcinolone (KENALOG) 0.025 % ointment APPLY TO AFFECTED AREA TWICE A DAY 30 g 0   No current facility-administered medications for this visit.    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 CATH LAB;  Service: Cardiovascular;  Laterality: N/A;   OTHER SURGICAL HISTORY  2008    Gamma knife surgery to remove brain met    PORTACATH PLACEMENT  -[name]   TIP IN CAVOATRIAL JUNCTION   TEE WITHOUT CARDIOVERSION N/A 04/19/2014   Procedure: TRANSESOPHAGEAL ECHOCARDIOGRAM (TEE);  Surgeon: Larey Dresser, MD;  Location: [place];  Service: Cardiovascular;  Laterality: N/A;    REVIEW OF SYSTEMS:  Constitutional: positive for fatigue Eyes: negative Ears, nose, mouth, throat, and face: negative Respiratory: negative Cardiovascular: negative Gastrointestinal: negative Genitourinary:negative Integument/breast: negative Hematologic/lymphatic: negative Musculoskeletal:negative Neurological: negative Behavioral/Psych: negative Endocrine: negative Allergic/Immunologic: negative   PHYSICAL EXAMINATION: General appearance: alert, cooperative, fatigued, and no distress Head: Normocephalic, without obvious abnormality, atraumatic Neck: no adenopathy, no JVD, supple, symmetrical, trachea midline, and thyroid not enlarged, symmetric, no tenderness/mass/nodules Lymph nodes: Cervical, supraclavicular, and axillary nodes normal. Resp: clear to auscultation bilaterally Back:  symmetric, no curvature. ROM normal. No CVA tenderness. Cardio: regular rate and rhythm, S1, S2 normal, no murmur, click, rub or gallop GI: soft, non-tender; bowel sounds normal; no masses,  no organomegaly Extremities: extremities normal, atraumatic, no cyanosis or edema Neurologic: Alert and oriented X 3, normal strength and tone. Normal symmetric reflexes. Normal coordination and gait  ECOG PERFORMANCE STATUS: 1 - Symptomatic but completely ambulatory  Blood pressure (!) 143/50, pulse 80, temperature (!) 97.4 F (36.3 C), temperature source Oral, resp. rate 18, height 5' 2.75" (1.594 m), weight 149 lb 9.6 oz (67.9 kg), SpO2 100 %.  LABORATORY DATA: Lab Results  Component Value Date   WBC 11.4 (H) 09/14/2021   HGB 13.7 09/14/2021   HCT 41.1 09/14/2021   MCV 91.7 09/14/2021   PLT 384 09/14/2021       Chemistry      Component Value Date/Time   NA 138 09/14/2021 1142   NA 142 12/24/2016 0816   K 4.3 09/14/2021 1142   K 4.4 12/24/2016 0816   CL 106 09/14/2021 1142   CL 106 06/30/2012 1004   CO2 27 09/14/2021 1142   CO2 28 12/24/2016 0816   BUN 22 09/14/2021 1142   BUN 15.8 12/24/2016 0816   CREATININE 0.81 09/14/2021 1142   CREATININE 0.97 10/18/2020 1323   CREATININE 1.1 12/24/2016 0816      Component Value Date/Time   CALCIUM 10.2 09/14/2021 1142   CALCIUM 9.6 12/24/2016 0816   ALKPHOS 73 09/14/2021 1142   ALKPHOS 80 12/24/2016 0816   AST 17 09/14/2021 1142   AST 21 10/18/2020 1323   AST 20 12/24/2016 0816   ALT 12 09/14/2021 1142   ALT 13 10/18/2020 1323   ALT 11 12/24/2016 0816   BILITOT 0.4 09/14/2021 1142   BILITOT 0.3 10/18/2020 1323   BILITOT 0.34 12/24/2016 0816       RADIOGRAPHIC STUDIES: CT Chest W Contrast  Result Date: 09/16/2021 CLINICAL DATA:  Non-small cell lung cancer staging chemotherapy, XRT complete * Tracking Code: BO * EXAM: CT CHEST, ABDOMEN, AND PELVIS WITH CONTRAST TECHNIQUE: Multidetector CT imaging of the chest, abdomen and pelvis was performed following the standard protocol during bolus administration of intravenous contrast. RADIATION DOSE REDUCTION: This exam was performed according to the departmental dose-optimization program which includes automated exposure control, adjustment of the mA and/or kV according to patient size and/or use of iterative reconstruction technique. CONTRAST:  127m OMNIPAQUE IOHEXOL 300 MG/ML SOLN additional oral enteric contrast COMPARISON:  06/13/2021 FINDINGS: CT CHEST FINDINGS Cardiovascular: Right chest port catheter. Aortic atherosclerosis. Normal heart size. Three-vessel coronary artery calcifications. No pericardial effusion. Mediastinum/Nodes: No enlarged mediastinal, hilar, or axillary lymph nodes. Thyroid gland, trachea, and esophagus demonstrate no significant findings. Lungs/Pleura: Severe  centrilobular emphysema and diffuse bilateral bronchial wall thickening. Bronchiolar plugging in the dependent right lower lobe (series 4, image 95). Unchanged paramedian perihilar consolidation and fibrosis of the right upper lobe (series 4, image 40). Interval decrease in size of an irregular nodule of the medial anterior left upper lobe measuring 1.1 x 0.6 cm, previously 1.7 x 1.2 cm (series 4, image 52). Unchanged tiny nodule of the posterior right upper lobe, most likely calcified measuring 0.3 cm (series 4, image 40). Unchanged trace right pleural effusion and associated pleural thickening (series 2, image 54). Musculoskeletal: Implantable loop recorder. No acute osseous findings. Unchanged high-grade vertebral plana deformity T3 with focal kyphosis. Vertebral cement augmentation of T4 and T7 CT ABDOMEN PELVIS FINDINGS Hepatobiliary: No solid liver abnormality is seen.  No gallstones or gallbladder wall thickening. Unchanged mild common bile duct dilatation measuring up to 0.8 cm (series 5, image 58). Pancreas: Unremarkable. No pancreatic ductal dilatation or surrounding inflammatory changes. Spleen: Normal in size without significant abnormality. Adrenals/Urinary Tract: Adrenal glands are unremarkable. Subcentimeter low-attenuation renal lesions, too small to characterize although almost certainly simple cysts. No specific follow-up required. Kidneys are otherwise normal, without renal calculi, solid lesion, or hydronephrosis. Bladder is unremarkable. Stomach/Bowel: Stomach is within normal limits. Appendix appears normal. No evidence of bowel wall thickening, distention, or inflammatory changes. Vascular/Lymphatic: Aortic atherosclerosis. No enlarged abdominal or pelvic lymph nodes. Reproductive: No mass or other abnormality. Other: No abdominal wall hernia or abnormality. No ascites. Musculoskeletal: No acute osseous findings. IMPRESSION: 1. Interval decrease in size of an irregular nodule of the medial  anterior left upper lobe measuring 1.1 x 0.6 cm, previously 1.7 x 1.2 cm, consistent with treatment response. 2. Unchanged paramedian perihilar consolidation and fibrosis of the right upper lobe. 3. No evidence of metastatic disease in the abdomen or pelvis. 4. Severe centrilobular emphysema and diffuse bilateral bronchial wall thickening. 5. Coronary artery disease. 6. Unchanged mild common bile duct dilatation measuring up to 0.8 cm, of doubtful clinical significance in the absence of evidence of cholestasis. No calculi or other abnormality. Attention on follow-up. Aortic Atherosclerosis (ICD10-I70.0) and Emphysema (ICD10-J43.9). Electronically Signed   By: Delanna Ahmadi M.D.   On: 09/16/2021 13:17   CT Abdomen Pelvis W Contrast  Result Date: 09/16/2021 CLINICAL DATA:  Non-small cell lung cancer staging chemotherapy, XRT complete * Tracking Code: BO * EXAM: CT CHEST, ABDOMEN, AND PELVIS WITH CONTRAST TECHNIQUE: Multidetector CT imaging of the chest, abdomen and pelvis was performed following the standard protocol during bolus administration of intravenous contrast. RADIATION DOSE REDUCTION: This exam was performed according to the departmental dose-optimization program which includes automated exposure control, adjustment of the mA and/or kV according to patient size and/or use of iterative reconstruction technique. CONTRAST:  15mL OMNIPAQUE IOHEXOL 300 MG/ML SOLN additional oral enteric contrast COMPARISON:  06/13/2021 FINDINGS: CT CHEST FINDINGS Cardiovascular: Right chest port catheter. Aortic atherosclerosis. Normal heart size. Three-vessel coronary artery calcifications. No pericardial effusion. Mediastinum/Nodes: No enlarged mediastinal, hilar, or axillary lymph nodes. Thyroid gland, trachea, and esophagus demonstrate no significant findings. Lungs/Pleura: Severe centrilobular emphysema and diffuse bilateral bronchial wall thickening. Bronchiolar plugging in the dependent right lower lobe (series 4,  image 95). Unchanged paramedian perihilar consolidation and fibrosis of the right upper lobe (series 4, image 40). Interval decrease in size of an irregular nodule of the medial anterior left upper lobe measuring 1.1 x 0.6 cm, previously 1.7 x 1.2 cm (series 4, image 52). Unchanged tiny nodule of the posterior right upper lobe, most likely calcified measuring 0.3 cm (series 4, image 40). Unchanged trace right pleural effusion and associated pleural thickening (series 2, image 54). Musculoskeletal: Implantable loop recorder. No acute osseous findings. Unchanged high-grade vertebral plana deformity T3 with focal kyphosis. Vertebral cement augmentation of T4 and T7 CT ABDOMEN PELVIS FINDINGS Hepatobiliary: No solid liver abnormality is seen. No gallstones or gallbladder wall thickening. Unchanged mild common bile duct dilatation measuring up to 0.8 cm (series 5, image 58). Pancreas: Unremarkable. No pancreatic ductal dilatation or surrounding inflammatory changes. Spleen: Normal in size without significant abnormality. Adrenals/Urinary Tract: Adrenal glands are unremarkable. Subcentimeter low-attenuation renal lesions, too small to characterize although almost certainly simple cysts. No specific follow-up required. Kidneys are otherwise normal, without renal calculi, solid lesion, or hydronephrosis. Bladder is unremarkable. Stomach/Bowel: Stomach  is within normal limits. Appendix appears normal. No evidence of bowel wall thickening, distention, or inflammatory changes. Vascular/Lymphatic: Aortic atherosclerosis. No enlarged abdominal or pelvic lymph nodes. Reproductive: No mass or other abnormality. Other: No abdominal wall hernia or abnormality. No ascites. Musculoskeletal: No acute osseous findings. IMPRESSION: 1. Interval decrease in size of an irregular nodule of the medial anterior left upper lobe measuring 1.1 x 0.6 cm, previously 1.7 x 1.2 cm, consistent with treatment response. 2. Unchanged paramedian perihilar  consolidation and fibrosis of the right upper lobe. 3. No evidence of metastatic disease in the abdomen or pelvis. 4. Severe centrilobular emphysema and diffuse bilateral bronchial wall thickening. 5. Coronary artery disease. 6. Unchanged mild common bile duct dilatation measuring up to 0.8 cm, of doubtful clinical significance in the absence of evidence of cholestasis. No calculi or other abnormality. Attention on follow-up. Aortic Atherosclerosis (ICD10-I70.0) and Emphysema (ICD10-J43.9). Electronically Signed   By: Delanna Ahmadi M.D.   On: 09/16/2021 13:17    ASSESSMENT AND PLAN: This is a very pleasant 71 years old white female with metastatic non-small cell lung cancer diagnosed in October 2007 status post concurrent chemoradiation followed by consolidation chemotherapy and she is currently on maintenance treatment with single agent Alimta status post 233 cycles. The patient has been tolerating this treatment well but recently she started having more nausea as well as fatigue. Her treatment is currently on hold and she has been on observation for the last 9 months and feeling well. She was found on previous CT scan of the chest in June 2023 to have slight further increase in the size of the poorly marginated irregular solid 1.7 cm anterior medial left upper lobe pulmonary nodule suspicious for metachronous primary bronchogenic carcinoma versus solitary metastasis. The patient underwent SBRT to this lesion and she tolerated it fairly well. She had repeat CT scan of the chest, abdomen and pelvis performed recently.  I personally and independently reviewed the scans and discussed the result with the patient and her family member. Her scan showed no evidence for disease progression and there was decrease in the size of the previously radiated left upper lobe pulmonary nodule. I recommended for the patient to continue on observation with repeat CT scan of the chest, abdomen and pelvis in 3 months. She was  advised to call immediately if she has any other concerning symptoms in the interval. The patient voices understanding of current disease status and treatment options and is in agreement with the current care plan. All questions were answered. The patient knows to call the clinic with any problems, questions or concerns. We can certainly see the patient much sooner if necessary. The total time spent in the appointment was 30 minutes.   Disclaimer: This note was dictated with voice recognition software. Similar sounding words can inadvertently be transcribed and may not be corrected upon review.

## 2021-10-03 ENCOUNTER — Telehealth: Payer: Self-pay | Admitting: Medical Oncology

## 2021-10-03 ENCOUNTER — Other Ambulatory Visit: Payer: Self-pay | Admitting: Internal Medicine

## 2021-10-03 DIAGNOSIS — R110 Nausea: Secondary | ICD-10-CM

## 2021-10-03 DIAGNOSIS — C3411 Malignant neoplasm of upper lobe, right bronchus or lung: Secondary | ICD-10-CM

## 2021-10-03 DIAGNOSIS — C3410 Malignant neoplasm of upper lobe, unspecified bronchus or lung: Secondary | ICD-10-CM

## 2021-10-03 DIAGNOSIS — C3490 Malignant neoplasm of unspecified part of unspecified bronchus or lung: Secondary | ICD-10-CM

## 2021-10-03 MED ORDER — MORPHINE SULFATE ER 60 MG PO TBCR: 60.0000 mg | EXTENDED_RELEASE_TABLET | Freq: Two times a day (BID) | ORAL | 0 refills | Status: DC

## 2021-10-03 MED ORDER — ONDANSETRON HCL 8 MG PO TABS: ORAL_TABLET | ORAL | 1 refills | Status: DC

## 2021-10-03 NOTE — Telephone Encounter (Signed)
Refills requested for MS contin 60 mg and Zofran.

## 2021-11-06 ENCOUNTER — Other Ambulatory Visit: Payer: Self-pay | Admitting: Family Medicine

## 2021-11-06 ENCOUNTER — Telehealth: Payer: Self-pay | Admitting: Medical Oncology

## 2021-11-06 ENCOUNTER — Other Ambulatory Visit: Payer: Self-pay | Admitting: Internal Medicine

## 2021-11-06 DIAGNOSIS — C3490 Malignant neoplasm of unspecified part of unspecified bronchus or lung: Secondary | ICD-10-CM

## 2021-11-06 DIAGNOSIS — C3410 Malignant neoplasm of upper lobe, unspecified bronchus or lung: Secondary | ICD-10-CM

## 2021-11-06 MED ORDER — MORPHINE SULFATE ER 60 MG PO TBCR: 60.0000 mg | EXTENDED_RELEASE_TABLET | Freq: Two times a day (BID) | ORAL | 0 refills | Status: DC

## 2021-11-06 NOTE — Telephone Encounter (Signed)
Refill request for [employer]ryker 60.

## 2021-11-26 ENCOUNTER — Telehealth: Payer: Self-pay | Admitting: Internal Medicine

## 2021-11-26 NOTE — Telephone Encounter (Signed)
Called patient regarding upcoming December appointments, patient is notified. 

## 2021-12-01 ENCOUNTER — Other Ambulatory Visit: Payer: Self-pay | Admitting: Family Medicine

## 2021-12-10 ENCOUNTER — Other Ambulatory Visit: Payer: Self-pay | Admitting: Internal Medicine

## 2021-12-10 ENCOUNTER — Telehealth: Payer: Self-pay | Admitting: Medical Oncology

## 2021-12-10 DIAGNOSIS — C3490 Malignant neoplasm of unspecified part of unspecified bronchus or lung: Secondary | ICD-10-CM

## 2021-12-10 DIAGNOSIS — C3410 Malignant neoplasm of upper lobe, unspecified bronchus or lung: Secondary | ICD-10-CM

## 2021-12-10 MED ORDER — MORPHINE SULFATE ER 60 MG PO TBCR: 60.0000 mg | EXTENDED_RELEASE_TABLET | Freq: Two times a day (BID) | ORAL | 0 refills | Status: DC

## 2021-12-10 MED ORDER — MORPHINE SULFATE 30 MG PO TABS: 30.0000 mg | ORAL_TABLET | ORAL | 0 refills | Status: DC | PRN

## 2021-12-10 NOTE — Telephone Encounter (Signed)
Requested refills on her MS Contin znd MSIR.

## 2021-12-14 ENCOUNTER — Inpatient Hospital Stay: Payer: Medicare Other | Attending: Physician Assistant

## 2021-12-14 ENCOUNTER — Inpatient Hospital Stay: Payer: Medicare Other

## 2021-12-14 ENCOUNTER — Other Ambulatory Visit: Payer: Self-pay

## 2021-12-14 ENCOUNTER — Ambulatory Visit (HOSPITAL_COMMUNITY)
Admission: RE | Admit: 2021-12-14 | Discharge: 2021-12-14 | Disposition: A | Discharge status: Home / Self Care | Payer: Medicare Other | Source: Ambulatory Visit | Attending: Internal Medicine | Admitting: Internal Medicine

## 2021-12-14 ENCOUNTER — Encounter (HOSPITAL_COMMUNITY): Payer: Self-pay

## 2021-12-14 DIAGNOSIS — C3490 Malignant neoplasm of unspecified part of unspecified bronchus or lung: Secondary | ICD-10-CM

## 2021-12-14 DIAGNOSIS — Z95828 Presence of other vascular implants and grafts: Secondary | ICD-10-CM

## 2021-12-14 DIAGNOSIS — C7931 Secondary malignant neoplasm of brain: Secondary | ICD-10-CM | POA: Insufficient documentation

## 2021-12-14 DIAGNOSIS — N289 Disorder of kidney and ureter, unspecified: Secondary | ICD-10-CM | POA: Diagnosis not present

## 2021-12-14 DIAGNOSIS — C3412 Malignant neoplasm of upper lobe, left bronchus or lung: Secondary | ICD-10-CM | POA: Insufficient documentation

## 2021-12-14 DIAGNOSIS — Z9221 Personal history of antineoplastic chemotherapy: Secondary | ICD-10-CM | POA: Insufficient documentation

## 2021-12-14 DIAGNOSIS — Z79899 Other long term (current) drug therapy: Secondary | ICD-10-CM | POA: Insufficient documentation

## 2021-12-14 DIAGNOSIS — J90 Pleural effusion, not elsewhere classified: Secondary | ICD-10-CM | POA: Diagnosis not present

## 2021-12-14 DIAGNOSIS — Z7982 Long term (current) use of aspirin: Secondary | ICD-10-CM | POA: Insufficient documentation

## 2021-12-14 DIAGNOSIS — Z923 Personal history of irradiation: Secondary | ICD-10-CM | POA: Insufficient documentation

## 2021-12-14 DIAGNOSIS — J432 Centrilobular emphysema: Secondary | ICD-10-CM | POA: Diagnosis not present

## 2021-12-14 LAB — CMP (CANCER CENTER ONLY)
ALT: 11 U/L (ref 0–44)
AST: 18 U/L (ref 15–41)
Albumin: 4.1 g/dL (ref 3.5–5.0)
Alkaline Phosphatase: 80 U/L (ref 38–126)
Anion gap: 8 (ref 5–15)
BUN: 23 mg/dL (ref 8–23)
CO2: 34 mmol/L — ABNORMAL HIGH (ref 22–32)
Calcium: 10.3 mg/dL (ref 8.9–10.3)
Chloride: 96 mmol/L — ABNORMAL LOW (ref 98–111)
Creatinine: 1.27 mg/dL — ABNORMAL HIGH (ref 0.44–1.00)
GFR, Estimated: 46 mL/min — ABNORMAL LOW (ref >60)
Glucose, Bld: 132 mg/dL — ABNORMAL HIGH (ref 70–99)
Potassium: 3.6 mmol/L (ref 3.5–5.1)
Sodium: 138 mmol/L (ref 135–145)
Total Bilirubin: 0.9 mg/dL (ref 0.3–1.2)
Total Protein: 7.3 g/dL (ref 6.5–8.1)

## 2021-12-14 LAB — CBC WITH DIFFERENTIAL (CANCER CENTER ONLY)
Abs Immature Granulocytes: 0.07 10*3/uL (ref 0.00–0.07)
Basophils Absolute: 0.1 10*3/uL (ref 0.0–0.1)
Basophils Relative: 0 %
Eosinophils Absolute: 0 10*3/uL (ref 0.0–0.5)
Eosinophils Relative: 0 %
HCT: 45.1 % (ref 36.0–46.0)
Hemoglobin: 15.2 g/dL — ABNORMAL HIGH (ref 12.0–15.0)
Immature Granulocytes: 1 %
Lymphocytes Relative: 20 %
Lymphs Abs: 3 10*3/uL (ref 0.7–4.0)
MCH: 30.2 pg (ref 26.0–34.0)
MCHC: 33.7 g/dL (ref 30.0–36.0)
MCV: 89.5 fL (ref 80.0–100.0)
Monocytes Absolute: 1.4 10*3/uL — ABNORMAL HIGH (ref 0.1–1.0)
Monocytes Relative: 9 %
Neutro Abs: 10.7 10*3/uL — ABNORMAL HIGH (ref 1.7–7.7)
Neutrophils Relative %: 70 %
Platelet Count: 403 10*3/uL — ABNORMAL HIGH (ref 150–400)
RBC: 5.04 MIL/uL (ref 3.87–5.11)
RDW: 13 % (ref 11.5–15.5)
WBC Count: 15.2 10*3/uL — ABNORMAL HIGH (ref 4.0–10.5)
nRBC: 0 % (ref 0.0–0.2)

## 2021-12-14 MED ORDER — SODIUM CHLORIDE (PF) 0.9 % IJ SOLN
INTRAMUSCULAR | Status: AC
  Filled 2021-12-14: qty 50

## 2021-12-14 MED ORDER — SODIUM CHLORIDE 0.9% FLUSH
10.0000 mL | INTRAVENOUS | Status: DC | PRN
  Administered 2021-12-14: 10 mL via INTRAVENOUS

## 2021-12-14 MED ORDER — ALTEPLASE 2 MG IJ SOLR
2.0000 mg | Freq: Once | INTRAMUSCULAR | Status: DC
  Filled 2021-12-14: qty 2

## 2021-12-14 MED ORDER — HEPARIN SOD (PORK) LOCK FLUSH 100 UNIT/ML IV SOLN
INTRAVENOUS | Status: AC
  Administered 2021-12-14: 500 [IU]
  Filled 2021-12-14: qty 5

## 2021-12-14 MED ORDER — IOHEXOL 300 MG/ML  SOLN
75.0000 mL | Freq: Once | INTRAMUSCULAR | Status: AC | PRN
  Administered 2021-12-14: 75 mL via INTRAVENOUS

## 2021-12-14 NOTE — Progress Notes (Signed)
No critical labs need to be addressed urgently. We will discuss labs in detail at upcoming office visit.   

## 2021-12-15 ENCOUNTER — Other Ambulatory Visit: Payer: Self-pay | Admitting: Internal Medicine

## 2021-12-15 DIAGNOSIS — D649 Anemia, unspecified: Secondary | ICD-10-CM

## 2021-12-17 ENCOUNTER — Inpatient Hospital Stay (HOSPITAL_BASED_OUTPATIENT_CLINIC_OR_DEPARTMENT_OTHER): Payer: Medicare Other | Admitting: Internal Medicine

## 2021-12-17 ENCOUNTER — Other Ambulatory Visit: Payer: Self-pay

## 2021-12-17 VITALS — BP 128/47 | HR 78 | Temp 98.1°F | Resp 19 | Wt 151.4 lb

## 2021-12-17 DIAGNOSIS — C3490 Malignant neoplasm of unspecified part of unspecified bronchus or lung: Secondary | ICD-10-CM

## 2021-12-17 DIAGNOSIS — Z9221 Personal history of antineoplastic chemotherapy: Secondary | ICD-10-CM | POA: Diagnosis not present

## 2021-12-17 DIAGNOSIS — Z7982 Long term (current) use of aspirin: Secondary | ICD-10-CM | POA: Diagnosis not present

## 2021-12-17 DIAGNOSIS — Z923 Personal history of irradiation: Secondary | ICD-10-CM | POA: Diagnosis not present

## 2021-12-17 DIAGNOSIS — Z79899 Other long term (current) drug therapy: Secondary | ICD-10-CM | POA: Diagnosis not present

## 2021-12-17 DIAGNOSIS — C7931 Secondary malignant neoplasm of brain: Secondary | ICD-10-CM | POA: Diagnosis not present

## 2021-12-17 DIAGNOSIS — C3412 Malignant neoplasm of upper lobe, left bronchus or lung: Secondary | ICD-10-CM | POA: Diagnosis not present

## 2021-12-17 NOTE — Progress Notes (Signed)
[facility] [phone]   [phone]  OFFICE PROGRESS NOTE  Danielle Sanders, MD [place] [address]  DIAGNOSIS: Metastatic non-small cell lung cancer initially diagnosed as locally advanced stage IIIB with a right Pancoast tumor involving the vertebral body as well as prominent canal invasion with spinal cord compression in October 2007. The patient also has metastatic disease to the brain in April 2008.  PRIOR THERAPY: Status post concurrent chemoradiation with weekly carboplatin and paclitaxel, last dose was given November 18, 2005. 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 Status post gamma knife radiotherapy to a solitary brain lesion located in the superior frontal area of the brain at [facility] in April of 2008. Status post palliative radiotherapy to the lateral abdominal wall metastatic lesion under the care of Dr. Lisbeth Renshaw, completed March of 2009. Status post 6 cycles of systemic chemotherapy with carboplatin and Alimta. Last dose was given July 26, 2007 with disease stabilization. Gamma knife stereotactic radiotherapy to 2 brain lesions one involving the right frontal dural based as well as right parietal lesion performed on 05/07/2012 under the care of Dr. Vallarie Mare at [facility]. Maintenance systemic chemotherapy with Alimta 500 MG/M2 every 3 weeks, status post 233 cycles.  Treatment is currently on hold since January 30, 2021.  SBRT to enlarging left upper lobe lung nodule under the care of Dr. Lisbeth Renshaw completed on July 27, 2021.  CURRENT THERAPY: Observation.  INTERVAL HISTORY: Danielle [name] 71 y.o. female returns to the clinic today for follow-up visit accompanied by her daughter Danielle [name].  The patient is feeling fine today with no concerning complaints.  She denied having any current chest pain, shortness of breath, cough or  hemoptysis.  She has no nausea, vomiting, diarrhea or constipation.  She has no headache or visual changes.  She denied having any significant weight loss or night sweats.  She has been in observation since January 2023.  The patient had repeat CT scan of the chest, abdomen and pelvis performed recently and she is here for evaluation and discussion of her scan results.  MEDICAL HISTORY: Past Medical History:  Diagnosis Date   Anemia    Antineoplastic chemotherapy induced anemia 01/23/2016   Carcinomas, basal cell 01/22/2019   two places on face   CVA (cerebral vascular accident) ([place]) 04/2014   pt states had 2 cva's within 2 wks   Diabetes mellitus without complication ([place])    DJD (degenerative joint disease), cervical    Fibromyalgia    H/O: pneumonia    History of tobacco abuse quit 10/08   on nicotine patch   Hypertension    Hypokalemia    Lung cancer ([place]) dx'd 09/2005   hx of non-small cell: metastasis to brain. had chemo and radiation for lung ca   Thrush 12/11/2010    ALLERGIES:  is allergic to contrast media [iodinated contrast media], iohexol, sulfa drugs cross reactors, and co-trimoxazole injection [sulfamethoxazole-trimethoprim].  MEDICATIONS:  Current Outpatient Medications  Medication Sig Dispense Refill   albuterol (VENTOLIN HFA) 108 (90 Base) MCG/ACT inhaler Sig: TAKE 2 PUFFS BY MOUTH EVERY 6 HOURS AS NEEDED FOR WHEEZE OR SHORTNESS OF BREATH 18 each 5   alendronate (FOSAMAX) 70 MG tablet TAKE 1 TABLET BY MOUTH EVERY 7 (SEVEN) DAYS. TAKE WITH A FULL GLASS OF WATER ON AN EMPTY STOMACH. 12 tablet 1   amLODipine (NORVASC) 10 MG tablet TAKE 1  TABLET BY MOUTH EVERY DAY 90 tablet 1   aspirin 325 MG EC tablet TAKE 1 TABLET BY MOUTH EVERY DAY 90 tablet 0   atorvastatin (LIPITOR) 40 MG tablet TAKE 1 TABLET BY MOUTH EVERY DAY AT 6 PM 90 tablet 0   Cholecalciferol (VITAMIN D3) 50 MCG (2000 UT) TABS Take by mouth.     diphenhydrAMINE (BENADRYL) 25 MG tablet Take 1 tablet (25 mg  total) by mouth as directed. 2 hours before CT scan. 5 tablet 0   FeFum-FePoly-FA-B Cmp-C-Biot (FOLIVANE-PLUS) CAPS TAKE 1 CAPSULE BY MOUTH EVERY DAY IN THE MORNING 90 capsule 3   fluticasone (FLONASE) 50 MCG/ACT nasal spray Place 1-2 sprays into both nostrils daily as needed. 16 g 5   folic acid (FOLVITE) 1 MG tablet TAKE 1 TABLET BY MOUTH EVERY DAY 90 tablet 1   furosemide (LASIX) 20 MG tablet TAKE 1 TABLET BY MOUTH EVERY DAY AS NEEDED FOR SWELLING 20 tablet 1   INCRUSE ELLIPTA 62.5 MCG/ACT AEPB INHALE 1 PUFF BY MOUTH EVERY DAY 30 each 5   lidocaine-prilocaine (EMLA) cream APPLY TO AFFECTED AREA FOR 1 DOSE 30 g 2   lisinopril (ZESTRIL) 20 MG tablet TAKE 2 TABLETS (40 MG TOTAL) BY MOUTH DAILY. 180 tablet 1   morphine (MS CONTIN) 60 MG 12 hr tablet Take 1 tablet (60 mg total) by mouth 2 (two) times daily. 60 tablet 0   morphine (MSIR) 30 MG tablet Take 1 tablet (30 mg total) by mouth every 4 (four) hours as needed (breakthrough pain). 60 tablet 0   Olopatadine HCl 0.2 % SOLN INSTILL 1 DROP INTO AFFECTED EYE(S) EVERY DAY 2.5 mL 1   ondansetron (ZOFRAN) 8 MG tablet TAKE 1 TABLET BY MOUTH EVERY 8 HOURS AS NEEDED FOR NAUSEA AND VOMITING 20 tablet 1   predniSONE (DELTASONE) 50 MG tablet 1 tablet by mouth 13 hours ,7 hours and 1 hour  before CT scan 3 tablet 12   prochlorperazine (COMPAZINE) 10 MG tablet Take 1 tablet (10 mg total) by mouth every 6 (six) hours as needed. 30 tablet 2   triamcinolone (KENALOG) 0.025 % ointment APPLY TO AFFECTED AREA TWICE A DAY 30 g 0   No current facility-administered medications for this visit.    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   LOOP RECORDER IMPLANT N/A 04/19/2014   Procedure: LOOP RECORDER IMPLANT;  Surgeon: Thompson Grayer, MD;  Location: [facility] CATH LAB;  Service: Cardiovascular;  Laterality: N/A;    OTHER SURGICAL HISTORY  2008   Gamma knife surgery to remove brain met    PORTACATH PLACEMENT  -[name]   TIP IN CAVOATRIAL JUNCTION   TEE WITHOUT CARDIOVERSION N/A 04/19/2014   Procedure: TRANSESOPHAGEAL ECHOCARDIOGRAM (TEE);  Surgeon: Larey Dresser, MD;  Location: [facility];  Service: Cardiovascular;  Laterality: N/A;    REVIEW OF SYSTEMS:  Constitutional: negative Eyes: negative Ears, nose, mouth, throat, and face: negative Respiratory: negative Cardiovascular: negative Gastrointestinal: negative Genitourinary:negative Integument/breast: negative Hematologic/lymphatic: negative Musculoskeletal:negative Neurological: negative Behavioral/Psych: negative Endocrine: negative Allergic/Immunologic: negative   PHYSICAL EXAMINATION: General appearance: alert, cooperative, and no distress Head: Normocephalic, without obvious abnormality, atraumatic Neck: no adenopathy, no JVD, supple, symmetrical, trachea midline, and thyroid not enlarged, symmetric, no tenderness/mass/nodules Lymph nodes: Cervical, supraclavicular, and axillary nodes normal. Resp: clear to auscultation  bilaterally Back: symmetric, no curvature. ROM normal. No CVA tenderness. Cardio: regular rate and rhythm, S1, S2 normal, no murmur, click, rub or gallop GI: soft, non-tender; bowel sounds normal; no masses,  no organomegaly Extremities: extremities normal, atraumatic, no cyanosis or edema Neurologic: Alert and oriented X 3, normal strength and tone. Normal symmetric reflexes. Normal coordination and gait  ECOG PERFORMANCE STATUS: 1 - Symptomatic but completely ambulatory  Blood pressure (!) 128/47, pulse 78, temperature 98.1 F (36.7 C), temperature source Oral, resp. rate 19, weight 151 lb 6 oz (68.7 kg), SpO2 94 %.  LABORATORY DATA: Lab Results  Component Value Date   WBC 15.2 (H) 12/14/2021   HGB 15.2 (H) 12/14/2021   HCT 45.1 12/14/2021   MCV 89.5 12/14/2021   PLT 403 (H) 12/14/2021      Chemistry       Component Value Date/Time   NA 138 12/14/2021 1052   NA 142 12/24/2016 0816   K 3.6 12/14/2021 1052   K 4.4 12/24/2016 0816   CL 96 (L) 12/14/2021 1052   CL 106 06/30/2012 1004   CO2 34 (H) 12/14/2021 1052   CO2 28 12/24/2016 0816   BUN 23 12/14/2021 1052   BUN 15.8 12/24/2016 0816   CREATININE 1.27 (H) 12/14/2021 1052   CREATININE 1.1 12/24/2016 0816      Component Value Date/Time   CALCIUM 10.3 12/14/2021 1052   CALCIUM 9.6 12/24/2016 0816   ALKPHOS 80 12/14/2021 1052   ALKPHOS 80 12/24/2016 0816   AST 18 12/14/2021 1052   AST 20 12/24/2016 0816   ALT 11 12/14/2021 1052   ALT 11 12/24/2016 0816   BILITOT 0.9 12/14/2021 1052   BILITOT 0.34 12/24/2016 0816       RADIOGRAPHIC STUDIES: CT Chest W Contrast  Result Date: 12/16/2021 CLINICAL DATA:  Non-small cell lung cancer restaging. Chemotherapy completed 9 months ago. * Tracking Code: BO * EXAM: CT CHEST, ABDOMEN, AND PELVIS WITH CONTRAST TECHNIQUE: Multidetector CT imaging of the chest, abdomen and pelvis was performed following the standard protocol during bolus administration of intravenous contrast. The patient under took a standard contrast allergy prep. RADIATION DOSE REDUCTION: This exam was performed according to the departmental dose-optimization program which includes automated exposure control, adjustment of the mA and/or kV according to patient size and/or use of iterative reconstruction technique. CONTRAST:  74m OMNIPAQUE IOHEXOL 300 MG/ML  SOLN COMPARISON:  Multiple exams, including 09/14/2021 FINDINGS: CT CHEST FINDINGS Cardiovascular: Right Port-A-Cath tip: Cavoatrial junction. Coronary, aortic arch, and branch vessel atherosclerotic vascular disease. The degree of atherosclerotic narrowing in the branch vessels is substantial. Mediastinum/Nodes: No pathologic adenopathy. Lungs/Pleura: Severe centrilobular emphysema. Biapical pleuroparenchymal scarring. Chronic right paramediastinal bandlike density favoring  prior radiation therapy. Substantial frothy plugging in much of the right lower lobe tracheobronchial tree. Minimal airway plugging in the left lower lobe. Mild bilateral airway thickening. Solid component of the left upper lobe nodule measures 0.6 by 0.5 cm on image 48 of series 7, by my measurements formerly 0.9 by 0.5 cm on 09/14/2021 and 1.0 by 1.0 cm on 06/13/2021, indicating continued improvement status post therapy. No new nodule identified. Stable trace right pleural effusion. Musculoskeletal: Loop recorder noted. Remote T3 vertebra plana with associated kyphosis. Remote vertebral augmentation at T4 and T7. CT ABDOMEN PELVIS FINDINGS Hepatobiliary: The common bile duct is mildly prominent at 0.8 cm in diameter on image 66 of series 2, but stable. Gallbladder unremarkable. No hepatic parenchymal lesion identified. Pancreas: Unremarkable Spleen: Unremarkable Adrenals/Urinary Tract: Hypodense bilateral renal  lesions are most compatible with small cysts but technically too small to characterize. No further imaging workup of these lesions is indicated. Adrenal glands unremarkable. Stomach/Bowel: Unremarkable Vascular/Lymphatic: Atherosclerosis is present, including aortoiliac atherosclerotic disease. There is atheromatous narrowing of the origin of the celiac trunk without occlusion. No pathologic adenopathy observed. Reproductive: Unremarkable Other: No supplemental non-categorized findings. Musculoskeletal: Multilevel lumbar degenerative facet arthropathy borderline left foraminal stenosis at L5-S1. Faint subcortical sclerosis along the femoral heads bilaterally, probably from degenerative arthropathy and less likely due to minimal avascular necrosis. No flattening or collapse. IMPRESSION: 1. Continued reduction in size of the left upper lobe nodule, currently 0.6 by 0.5 cm, previously 0.9 by 0.5 cm on 09/14/2021 and 1.0 by 1.0 cm on 06/13/2021. 2. Continued frothy plugging in the right lower lobe  tracheobronchial tree. 3. Stable trace right pleural effusion. 4. Stable bandlike density in the right paramediastinal region, likely from prior radiation therapy. 5. Remote T3 vertebra plana with associated kyphosis. Remote vertebral augmentations at T4 and T7. 6. No findings of active malignancy in the abdomen/pelvis. 7. Atheromatous narrowing of the origin of the celiac trunk without occlusion. 8. Multilevel lumbar degenerative facet arthropathy. 9. Other imaging findings of potential clinical significance: Coronary, aortic arch, and branch vessel atherosclerotic narrowing. Mild prominence of the common bile duct at 0.8 cm in diameter, but stable from prior. Multilevel lumbar degenerative facet arthropathy with borderline left foraminal stenosis at L5-S1. Faint subcortical sclerosis along the femoral heads bilaterally, probably from degenerative arthropathy and less likely due to minimal avascular necrosis. Aortic Atherosclerosis (ICD10-I70.0) and Emphysema (ICD10-J43.9). Electronically Signed   By: Van Clines M.D.   On: 12/16/2021 17:07   CT Abdomen Pelvis W Contrast  Result Date: 12/16/2021 CLINICAL DATA:  Non-small cell lung cancer restaging. Chemotherapy completed 9 months ago. * Tracking Code: BO * EXAM: CT CHEST, ABDOMEN, AND PELVIS WITH CONTRAST TECHNIQUE: Multidetector CT imaging of the chest, abdomen and pelvis was performed following the standard protocol during bolus administration of intravenous contrast. The patient under took a standard contrast allergy prep. RADIATION DOSE REDUCTION: This exam was performed according to the departmental dose-optimization program which includes automated exposure control, adjustment of the mA and/or kV according to patient size and/or use of iterative reconstruction technique. CONTRAST:  68m OMNIPAQUE IOHEXOL 300 MG/ML  SOLN COMPARISON:  Multiple exams, including 09/14/2021 FINDINGS: CT CHEST FINDINGS Cardiovascular: Right Port-A-Cath tip: Cavoatrial  junction. Coronary, aortic arch, and branch vessel atherosclerotic vascular disease. The degree of atherosclerotic narrowing in the branch vessels is substantial. Mediastinum/Nodes: No pathologic adenopathy. Lungs/Pleura: Severe centrilobular emphysema. Biapical pleuroparenchymal scarring. Chronic right paramediastinal bandlike density favoring prior radiation therapy. Substantial frothy plugging in much of the right lower lobe tracheobronchial tree. Minimal airway plugging in the left lower lobe. Mild bilateral airway thickening. Solid component of the left upper lobe nodule measures 0.6 by 0.5 cm on image 48 of series 7, by my measurements formerly 0.9 by 0.5 cm on 09/14/2021 and 1.0 by 1.0 cm on 06/13/2021, indicating continued improvement status post therapy. No new nodule identified. Stable trace right pleural effusion. Musculoskeletal: Loop recorder noted. Remote T3 vertebra plana with associated kyphosis. Remote vertebral augmentation at T4 and T7. CT ABDOMEN PELVIS FINDINGS Hepatobiliary: The common bile duct is mildly prominent at 0.8 cm in diameter on image 66 of series 2, but stable. Gallbladder unremarkable. No hepatic parenchymal lesion identified. Pancreas: Unremarkable Spleen: Unremarkable Adrenals/Urinary Tract: Hypodense bilateral renal lesions are most compatible with small cysts but technically too small to characterize. No  further imaging workup of these lesions is indicated. Adrenal glands unremarkable. Stomach/Bowel: Unremarkable Vascular/Lymphatic: Atherosclerosis is present, including aortoiliac atherosclerotic disease. There is atheromatous narrowing of the origin of the celiac trunk without occlusion. No pathologic adenopathy observed. Reproductive: Unremarkable Other: No supplemental non-categorized findings. Musculoskeletal: Multilevel lumbar degenerative facet arthropathy borderline left foraminal stenosis at L5-S1. Faint subcortical sclerosis along the femoral heads bilaterally,  probably from degenerative arthropathy and less likely due to minimal avascular necrosis. No flattening or collapse. IMPRESSION: 1. Continued reduction in size of the left upper lobe nodule, currently 0.6 by 0.5 cm, previously 0.9 by 0.5 cm on 09/14/2021 and 1.0 by 1.0 cm on 06/13/2021. 2. Continued frothy plugging in the right lower lobe tracheobronchial tree. 3. Stable trace right pleural effusion. 4. Stable bandlike density in the right paramediastinal region, likely from prior radiation therapy. 5. Remote T3 vertebra plana with associated kyphosis. Remote vertebral augmentations at T4 and T7. 6. No findings of active malignancy in the abdomen/pelvis. 7. Atheromatous narrowing of the origin of the celiac trunk without occlusion. 8. Multilevel lumbar degenerative facet arthropathy. 9. Other imaging findings of potential clinical significance: Coronary, aortic arch, and branch vessel atherosclerotic narrowing. Mild prominence of the common bile duct at 0.8 cm in diameter, but stable from prior. Multilevel lumbar degenerative facet arthropathy with borderline left foraminal stenosis at L5-S1. Faint subcortical sclerosis along the femoral heads bilaterally, probably from degenerative arthropathy and less likely due to minimal avascular necrosis. Aortic Atherosclerosis (ICD10-I70.0) and Emphysema (ICD10-J43.9). Electronically Signed   By: Van Clines M.D.   On: 12/16/2021 17:07    ASSESSMENT AND PLAN: This is a very pleasant 71 years old white female with metastatic non-small cell lung cancer diagnosed in October 2007 status post concurrent chemoradiation followed by consolidation chemotherapy and she is currently on maintenance treatment with single agent Alimta status post 233 cycles. The patient has been tolerating this treatment well but recently she started having more nausea as well as fatigue. Her treatment is currently on hold and she has been on observation for the last 11 months and feeling  well. She was found on previous CT scan of the chest in June 2023 to have slight further increase in the size of the poorly marginated irregular solid 1.7 cm anterior medial left upper lobe pulmonary nodule suspicious for metachronous primary bronchogenic carcinoma versus solitary metastasis. The patient underwent SBRT to this lesion and she tolerated it fairly well. The patient has no significant complaints today. She had repeat CT scan of the chest, abdomen and pelvis performed recently.  I personally and independently reviewed the scan and discussed the result with the patient and her daughter. Her scan showed no concerning findings for disease progression and there was continued reduction in the size of the left upper lobe nodule. I recommended for the patient to continue on observation with repeat CT scan of the chest, abdomen and pelvis in 4 months. For the pain management she will continue her current treatment with MS Contin and morphine sulfate on as-needed basis. She was advised to call immediately if she has any other concerning symptoms in the interval. The patient voices understanding of current disease status and treatment options and is in agreement with the current care plan. All questions were answered. The patient knows to call the clinic with any problems, questions or concerns. We can certainly see the patient much sooner if necessary. The total time spent in the appointment was 30 minutes.   Disclaimer: This note was dictated with  voice recognition software. Similar sounding words can inadvertently be transcribed and may not be corrected upon review.

## 2021-12-25 ENCOUNTER — Other Ambulatory Visit: Payer: Self-pay | Admitting: Family Medicine

## 2022-01-15 ENCOUNTER — Telehealth: Payer: Self-pay | Admitting: Medical Oncology

## 2022-01-15 ENCOUNTER — Other Ambulatory Visit: Payer: Self-pay | Admitting: Internal Medicine

## 2022-01-15 DIAGNOSIS — C3410 Malignant neoplasm of upper lobe, unspecified bronchus or lung: Secondary | ICD-10-CM

## 2022-01-15 DIAGNOSIS — C3490 Malignant neoplasm of unspecified part of unspecified bronchus or lung: Secondary | ICD-10-CM

## 2022-01-15 MED ORDER — MORPHINE SULFATE ER 60 MG PO TBCR: 60.0000 mg | EXTENDED_RELEASE_TABLET | Freq: Two times a day (BID) | ORAL | 0 refills | Status: DC

## 2022-01-15 NOTE — Telephone Encounter (Signed)
Refill requested fro MS contin 60 mg.

## 2022-01-17 ENCOUNTER — Other Ambulatory Visit: Payer: Self-pay | Admitting: Physician Assistant

## 2022-01-17 DIAGNOSIS — C3410 Malignant neoplasm of upper lobe, unspecified bronchus or lung: Secondary | ICD-10-CM

## 2022-02-01 ENCOUNTER — Other Ambulatory Visit: Payer: Self-pay | Admitting: Physician Assistant

## 2022-02-01 ENCOUNTER — Other Ambulatory Visit: Payer: Self-pay | Admitting: Family Medicine

## 2022-02-01 DIAGNOSIS — C3411 Malignant neoplasm of upper lobe, right bronchus or lung: Secondary | ICD-10-CM

## 2022-02-01 NOTE — Telephone Encounter (Signed)
She is not on treatment and does not need to take this

## 2022-02-03 NOTE — Telephone Encounter (Signed)
Last office visit 09/14/21 for [place].  Last Lipid 09/04/2020.  Refill?

## 2022-02-18 ENCOUNTER — Other Ambulatory Visit: Payer: Self-pay

## 2022-02-18 DIAGNOSIS — C3490 Malignant neoplasm of unspecified part of unspecified bronchus or lung: Secondary | ICD-10-CM

## 2022-02-18 DIAGNOSIS — C3410 Malignant neoplasm of upper lobe, unspecified bronchus or lung: Secondary | ICD-10-CM

## 2022-02-18 MED ORDER — MORPHINE SULFATE ER 60 MG PO TBCR: 60.0000 mg | EXTENDED_RELEASE_TABLET | Freq: Two times a day (BID) | ORAL | 0 refills | Status: DC

## 2022-02-28 ENCOUNTER — Other Ambulatory Visit: Payer: Self-pay | Admitting: Physician Assistant

## 2022-02-28 DIAGNOSIS — C3411 Malignant neoplasm of upper lobe, right bronchus or lung: Secondary | ICD-10-CM

## 2022-02-28 NOTE — Telephone Encounter (Signed)
Does not need. Patient Aware. I have called her

## 2022-03-16 ENCOUNTER — Encounter: Payer: Self-pay | Admitting: Internal Medicine

## 2022-03-27 ENCOUNTER — Other Ambulatory Visit: Payer: Self-pay

## 2022-03-27 DIAGNOSIS — C3410 Malignant neoplasm of upper lobe, unspecified bronchus or lung: Secondary | ICD-10-CM

## 2022-03-27 DIAGNOSIS — R110 Nausea: Secondary | ICD-10-CM

## 2022-03-27 DIAGNOSIS — C3411 Malignant neoplasm of upper lobe, right bronchus or lung: Secondary | ICD-10-CM

## 2022-03-27 DIAGNOSIS — Z91041 Radiographic dye allergy status: Secondary | ICD-10-CM

## 2022-03-27 DIAGNOSIS — C3490 Malignant neoplasm of unspecified part of unspecified bronchus or lung: Secondary | ICD-10-CM

## 2022-03-27 MED ORDER — PREDNISONE 50 MG PO TABS: ORAL_TABLET | ORAL | 12 refills | Status: AC

## 2022-03-27 MED ORDER — ONDANSETRON HCL 8 MG PO TABS: ORAL_TABLET | ORAL | 1 refills | Status: DC

## 2022-03-27 MED ORDER — MORPHINE SULFATE ER 60 MG PO TBCR: 60.0000 mg | EXTENDED_RELEASE_TABLET | Freq: Two times a day (BID) | ORAL | 0 refills | Status: DC

## 2022-03-30 DIAGNOSIS — H1031 Unspecified acute conjunctivitis, right eye: Secondary | ICD-10-CM | POA: Diagnosis not present

## 2022-04-15 ENCOUNTER — Other Ambulatory Visit: Payer: Self-pay

## 2022-04-15 ENCOUNTER — Inpatient Hospital Stay: Payer: 59 | Attending: Nurse Practitioner | Admitting: Internal Medicine

## 2022-04-15 ENCOUNTER — Ambulatory Visit (HOSPITAL_COMMUNITY)
Admission: RE | Admit: 2022-04-15 | Discharge: 2022-04-15 | Disposition: A | Discharge status: Home / Self Care | Payer: 59 | Source: Ambulatory Visit | Attending: Internal Medicine | Admitting: Internal Medicine

## 2022-04-15 DIAGNOSIS — Z79899 Other long term (current) drug therapy: Secondary | ICD-10-CM | POA: Insufficient documentation

## 2022-04-15 DIAGNOSIS — R110 Nausea: Secondary | ICD-10-CM | POA: Insufficient documentation

## 2022-04-15 DIAGNOSIS — C3490 Malignant neoplasm of unspecified part of unspecified bronchus or lung: Secondary | ICD-10-CM | POA: Insufficient documentation

## 2022-04-15 DIAGNOSIS — Z85118 Personal history of other malignant neoplasm of bronchus and lung: Secondary | ICD-10-CM | POA: Insufficient documentation

## 2022-04-15 DIAGNOSIS — C7931 Secondary malignant neoplasm of brain: Secondary | ICD-10-CM | POA: Diagnosis not present

## 2022-04-15 DIAGNOSIS — G893 Neoplasm related pain (acute) (chronic): Secondary | ICD-10-CM | POA: Diagnosis not present

## 2022-04-15 DIAGNOSIS — Z9221 Personal history of antineoplastic chemotherapy: Secondary | ICD-10-CM | POA: Insufficient documentation

## 2022-04-15 DIAGNOSIS — Z923 Personal history of irradiation: Secondary | ICD-10-CM | POA: Diagnosis not present

## 2022-04-15 LAB — CBC WITH DIFFERENTIAL (CANCER CENTER ONLY)
Abs Immature Granulocytes: 0.04 10*3/uL (ref 0.00–0.07)
Basophils Absolute: 0.1 10*3/uL (ref 0.0–0.1)
Basophils Relative: 1 %
Eosinophils Absolute: 0.1 10*3/uL (ref 0.0–0.5)
Eosinophils Relative: 1 %
HCT: 38.5 % (ref 36.0–46.0)
Hemoglobin: 12.7 g/dL (ref 12.0–15.0)
Immature Granulocytes: 0 %
Lymphocytes Relative: 23 %
Lymphs Abs: 2.3 10*3/uL (ref 0.7–4.0)
MCH: 30 pg (ref 26.0–34.0)
MCHC: 33 g/dL (ref 30.0–36.0)
MCV: 90.8 fL (ref 80.0–100.0)
Monocytes Absolute: 0.9 10*3/uL (ref 0.1–1.0)
Monocytes Relative: 9 %
Neutro Abs: 6.6 10*3/uL (ref 1.7–7.7)
Neutrophils Relative %: 66 %
Platelet Count: 367 10*3/uL (ref 150–400)
RBC: 4.24 MIL/uL (ref 3.87–5.11)
RDW: 13.5 % (ref 11.5–15.5)
WBC Count: 10 10*3/uL (ref 4.0–10.5)
nRBC: 0 % (ref 0.0–0.2)

## 2022-04-15 LAB — CMP (CANCER CENTER ONLY)
ALT: 7 U/L (ref 0–44)
AST: 13 U/L — ABNORMAL LOW (ref 15–41)
Albumin: 3.7 g/dL (ref 3.5–5.0)
Alkaline Phosphatase: 64 U/L (ref 38–126)
Anion gap: 6 (ref 5–15)
BUN: 20 mg/dL (ref 8–23)
CO2: 27 mmol/L (ref 22–32)
Calcium: 10.1 mg/dL (ref 8.9–10.3)
Chloride: 105 mmol/L (ref 98–111)
Creatinine: 0.99 mg/dL (ref 0.44–1.00)
GFR, Estimated: >60 mL/min (ref >60)
Glucose, Bld: 121 mg/dL — ABNORMAL HIGH (ref 70–99)
Potassium: 4 mmol/L (ref 3.5–5.1)
Sodium: 138 mmol/L (ref 135–145)
Total Bilirubin: 0.3 mg/dL (ref 0.3–1.2)
Total Protein: 7.3 g/dL (ref 6.5–8.1)

## 2022-04-15 MED ORDER — HEPARIN SOD (PORK) LOCK FLUSH 100 UNIT/ML IV SOLN
500.0000 [IU] | Freq: Once | INTRAVENOUS | Status: AC
  Administered 2022-04-15: 500 [IU] via INTRAVENOUS

## 2022-04-15 MED ORDER — IOHEXOL 300 MG/ML  SOLN
100.0000 mL | Freq: Once | INTRAMUSCULAR | Status: AC | PRN
  Administered 2022-04-15: 100 mL via INTRAVENOUS

## 2022-04-16 NOTE — Progress Notes (Signed)
No critical labs need to be addressed urgently. We will discuss labs in detail at upcoming office visit.   

## 2022-04-17 ENCOUNTER — Inpatient Hospital Stay: Payer: 59 | Admitting: Internal Medicine

## 2022-04-19 ENCOUNTER — Telehealth: Payer: Self-pay | Admitting: Internal Medicine

## 2022-04-19 NOTE — Telephone Encounter (Signed)
Patients daughter called to reschedule patients missed appointment.

## 2022-04-22 DIAGNOSIS — C7931 Secondary malignant neoplasm of brain: Secondary | ICD-10-CM | POA: Diagnosis not present

## 2022-04-22 DIAGNOSIS — C3411 Malignant neoplasm of upper lobe, right bronchus or lung: Secondary | ICD-10-CM | POA: Diagnosis not present

## 2022-04-22 DIAGNOSIS — C3490 Malignant neoplasm of unspecified part of unspecified bronchus or lung: Secondary | ICD-10-CM | POA: Diagnosis not present

## 2022-04-29 ENCOUNTER — Telehealth: Payer: Self-pay | Admitting: Nurse Practitioner

## 2022-04-29 ENCOUNTER — Inpatient Hospital Stay (HOSPITAL_BASED_OUTPATIENT_CLINIC_OR_DEPARTMENT_OTHER): Payer: 59 | Admitting: Internal Medicine

## 2022-04-29 ENCOUNTER — Encounter: Payer: Self-pay | Admitting: Internal Medicine

## 2022-04-29 ENCOUNTER — Other Ambulatory Visit: Payer: Self-pay

## 2022-04-29 VITALS — BP 163/62 | HR 84 | Temp 97.5°F | Resp 15 | Wt 147.3 lb

## 2022-04-29 DIAGNOSIS — Z923 Personal history of irradiation: Secondary | ICD-10-CM

## 2022-04-29 DIAGNOSIS — Z9221 Personal history of antineoplastic chemotherapy: Secondary | ICD-10-CM

## 2022-04-29 DIAGNOSIS — C3410 Malignant neoplasm of upper lobe, unspecified bronchus or lung: Secondary | ICD-10-CM

## 2022-04-29 DIAGNOSIS — Z79899 Other long term (current) drug therapy: Secondary | ICD-10-CM | POA: Diagnosis not present

## 2022-04-29 DIAGNOSIS — R110 Nausea: Secondary | ICD-10-CM

## 2022-04-29 DIAGNOSIS — Z85118 Personal history of other malignant neoplasm of bronchus and lung: Secondary | ICD-10-CM

## 2022-04-29 DIAGNOSIS — G893 Neoplasm related pain (acute) (chronic): Secondary | ICD-10-CM | POA: Diagnosis not present

## 2022-04-29 DIAGNOSIS — C7931 Secondary malignant neoplasm of brain: Secondary | ICD-10-CM | POA: Diagnosis not present

## 2022-04-29 DIAGNOSIS — C3490 Malignant neoplasm of unspecified part of unspecified bronchus or lung: Secondary | ICD-10-CM

## 2022-04-29 MED ORDER — MORPHINE SULFATE ER 60 MG PO TBCR
60.0000 mg | EXTENDED_RELEASE_TABLET | Freq: Two times a day (BID) | ORAL | 0 refills | Status: DC
Start: 2022-04-29 — End: 2022-05-27

## 2022-04-29 MED ORDER — ONDANSETRON 4 MG PO TBDP: 4.0000 mg | ORAL_TABLET | Freq: Three times a day (TID) | ORAL | 0 refills | Status: DC | PRN

## 2022-04-29 NOTE — Progress Notes (Signed)
[facility] [phone]   [phone]  OFFICE PROGRESS NOTE  Excell Seltzer, MD [address] [place] [address]  DIAGNOSIS: Metastatic non-small cell lung cancer initially diagnosed as locally advanced stage IIIB with a right Pancoast tumor involving the vertebral body as well as prominent canal invasion with spinal cord compression in October 2007. The patient also has metastatic disease to the brain in April 2008.  PRIOR THERAPY: Status post concurrent chemoradiation with weekly carboplatin and paclitaxel, last dose was given November 18, 2005. 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 Status post gamma knife radiotherapy to a solitary brain lesion located in the superior frontal area of the brain at [facility] in April of 2008. Status post palliative radiotherapy to the lateral abdominal wall metastatic lesion under the care of Dr. Mitzi Hansen, completed March of 2009. Status post 6 cycles of systemic chemotherapy with carboplatin and Alimta. Last dose was given July 26, 2007 with disease stabilization. Gamma knife stereotactic radiotherapy to 2 brain lesions one involving the right frontal dural based as well as right parietal lesion performed on 05/07/2012 under the care of Dr. Johny Drilling at [facility]. Maintenance systemic chemotherapy with Alimta 500 MG/M2 every 3 weeks, status post 233 cycles.  Treatment is currently on hold since January 30, 2021.  SBRT to enlarging left upper lobe lung nodule under the care of Dr. Mitzi Hansen completed on July 27, 2021.  CURRENT THERAPY: Observation.  INTERVAL HISTORY: Danielle [name] 72 y.o. female return to the clinic today for follow-up visit accompanied by her daughter.  The patient is feeling fine today with no concerning complaints except for anxiety about her CT scan results.  She missed her appointment 2 weeks ago  because she has some hot flashes and if she was not feeling well.  She denied having any current chest pain, shortness of breath, cough or hemoptysis.  She has no nausea, vomiting, diarrhea or constipation.  She has no headache or visual changes.  She denied having any fever or chills.  She is here today for evaluation with repeat CT scan of the chest, abdomen and pelvis for restaging of her disease.  MEDICAL HISTORY: Past Medical History:  Diagnosis Date   Anemia    Antineoplastic chemotherapy induced anemia 01/23/2016   Carcinomas, basal cell 01/22/2019   two places on face   CVA (cerebral vascular accident) 04/2014   pt states had 2 cva's within 2 wks   Diabetes mellitus without complication    DJD (degenerative joint disease), cervical    Fibromyalgia    H/O: pneumonia    History of tobacco abuse quit 10/08   on nicotine patch   Hypertension    Hypokalemia    Lung cancer dx'd 09/2005   hx of non-small cell: metastasis to brain. had chemo and radiation for lung ca   Thrush 12/11/2010    ALLERGIES:  is allergic to contrast media [iodinated contrast media], iohexol, sulfa drugs cross reactors, and co-trimoxazole injection [sulfamethoxazole-trimethoprim].  MEDICATIONS:  Current Outpatient Medications  Medication Sig Dispense Refill   albuterol (VENTOLIN HFA) 108 (90 Base) MCG/ACT inhaler TAKE 2 PUFFS BY MOUTH EVERY 6 HOURS AS NEEDED FOR WHEEZE OR SHORTNESS OF BREATH 8.5 each 5   alendronate (FOSAMAX) 70 MG tablet TAKE 1 TABLET BY MOUTH EVERY 7 (SEVEN) DAYS. TAKE WITH A FULL GLASS OF WATER ON AN EMPTY STOMACH. 12 tablet 1  amLODipine (NORVASC) 10 MG tablet TAKE 1 TABLET BY MOUTH EVERY DAY 90 tablet 1   aspirin 325 MG EC tablet TAKE 1 TABLET BY MOUTH EVERY DAY 90 tablet 0   atorvastatin (LIPITOR) 40 MG tablet TAKE 1 TABLET BY MOUTH EVERY DAY AT 6 PM 90 tablet 0   Cholecalciferol (VITAMIN D3) 50 MCG (2000 UT) TABS Take by mouth.     diphenhydrAMINE (BENADRYL) 25 MG tablet Take 1 tablet  (25 mg total) by mouth as directed. 2 hours before CT scan. 5 tablet 0   FeFum-FePoly-FA-B Cmp-C-Biot (FOLIVANE-PLUS) CAPS TAKE 1 CAPSULE BY MOUTH EVERY DAY IN THE MORNING 90 capsule 3   fluticasone (FLONASE) 50 MCG/ACT nasal spray Place 1-2 sprays into both nostrils daily as needed. 16 g 5   folic acid (FOLVITE) 1 MG tablet TAKE 1 TABLET BY MOUTH EVERY DAY 90 tablet 1   furosemide (LASIX) 20 MG tablet TAKE 1 TABLET BY MOUTH EVERY DAY AS NEEDED FOR SWELLING 20 tablet 1   INCRUSE ELLIPTA 62.5 MCG/ACT AEPB INHALE 1 PUFF BY MOUTH EVERY DAY 30 each 5   lidocaine-prilocaine (EMLA) cream APPLY TO AFFECTED AREA FOR 1 DOSE 30 g 2   lisinopril (ZESTRIL) 20 MG tablet TAKE 2 TABLETS (40 MG TOTAL) BY MOUTH DAILY. 180 tablet 1   morphine (MS CONTIN) 60 MG 12 hr tablet Take 1 tablet (60 mg total) by mouth 2 (two) times daily. 60 tablet 0   morphine (MSIR) 30 MG tablet Take 1 tablet (30 mg total) by mouth every 4 (four) hours as needed (breakthrough pain). 60 tablet 0   Olopatadine HCl 0.2 % SOLN INSTILL 1 DROP INTO AFFECTED EYE(S) EVERY DAY 2.5 mL 1   ondansetron (ZOFRAN) 8 MG tablet TAKE 1 TABLET BY MOUTH EVERY 8 HOURS AS NEEDED FOR NAUSEA AND VOMITING 20 tablet 1   predniSONE (DELTASONE) 50 MG tablet 1 tablet by mouth 13 hours ,7 hours and 1 hour  before CT scan 3 tablet 12   prochlorperazine (COMPAZINE) 10 MG tablet Take 1 tablet (10 mg total) by mouth every 6 (six) hours as needed. 30 tablet 2   triamcinolone (KENALOG) 0.025 % ointment APPLY TO AFFECTED AREA TWICE A DAY 30 g 0   No current facility-administered medications for this visit.    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   LOOP RECORDER IMPLANT N/A 04/19/2014   Procedure: LOOP RECORDER IMPLANT;  Surgeon: Hillis Range, MD;  Location: [facility] CATH LAB;  Service: Cardiovascular;  Laterality:  N/A;   OTHER SURGICAL HISTORY  2008   Gamma knife surgery to remove brain met    PORTACATH PLACEMENT  -[name]   TIP IN CAVOATRIAL JUNCTION   TEE WITHOUT CARDIOVERSION N/A 04/19/2014   Procedure: TRANSESOPHAGEAL ECHOCARDIOGRAM (TEE);  Surgeon: Laurey Morale, MD;  Location: [facility] ENDOSCOPY;  Service: Cardiovascular;  Laterality: N/A;    REVIEW OF SYSTEMS:  Constitutional: negative Eyes: negative Ears, nose, mouth, throat, and face: negative Respiratory: negative Cardiovascular: negative Gastrointestinal: positive for nausea Genitourinary:negative Integument/breast: negative Hematologic/lymphatic: negative Musculoskeletal:negative Neurological: negative Behavioral/Psych: negative Endocrine: negative Allergic/Immunologic: negative   PHYSICAL EXAMINATION: General appearance: alert, cooperative, and no distress Head: Normocephalic, without obvious abnormality, atraumatic Neck: no adenopathy, no JVD, supple, symmetrical, trachea midline, and thyroid not enlarged, symmetric, no tenderness/mass/nodules Lymph nodes: Cervical,  supraclavicular, and axillary nodes normal. Resp: clear to auscultation bilaterally Back: symmetric, no curvature. ROM normal. No CVA tenderness. Cardio: regular rate and rhythm, S1, S2 normal, no murmur, click, rub or gallop GI: soft, non-tender; bowel sounds normal; no masses,  no organomegaly Extremities: extremities normal, atraumatic, no cyanosis or edema Neurologic: Alert and oriented X 3, normal strength and tone. Normal symmetric reflexes. Normal coordination and gait  ECOG PERFORMANCE STATUS: 1 - Symptomatic but completely ambulatory  Blood pressure (!) 163/62, pulse 84, temperature (!) 97.5 F (36.4 C), temperature source Oral, resp. rate 15, weight 147 lb 4.8 oz (66.8 kg), SpO2 95 %.  LABORATORY DATA: Lab Results  Component Value Date   WBC 10.0 04/15/2022   HGB 12.7 04/15/2022   HCT 38.5 04/15/2022   MCV 90.8 04/15/2022   PLT 367 04/15/2022       Chemistry      Component Value Date/Time   NA 138 04/15/2022 1511   NA 142 12/24/2016 0816   K 4.0 04/15/2022 1511   K 4.4 12/24/2016 0816   CL 105 04/15/2022 1511   CL 106 06/30/2012 1004   CO2 27 04/15/2022 1511   CO2 28 12/24/2016 0816   BUN 20 04/15/2022 1511   BUN 15.8 12/24/2016 0816   CREATININE 0.99 04/15/2022 1511   CREATININE 1.1 12/24/2016 0816      Component Value Date/Time   CALCIUM 10.1 04/15/2022 1511   CALCIUM 9.6 12/24/2016 0816   ALKPHOS 64 04/15/2022 1511   ALKPHOS 80 12/24/2016 0816   AST 13 (L) 04/15/2022 1511   AST 20 12/24/2016 0816   ALT 7 04/15/2022 1511   ALT 11 12/24/2016 0816   BILITOT 0.3 04/15/2022 1511   BILITOT 0.34 12/24/2016 0816       RADIOGRAPHIC STUDIES: CT Chest W Contrast  Result Date: 04/16/2022 CLINICAL DATA:  Non-small cell lung cancer; * Tracking Code: BO * EXAM: CT CHEST, ABDOMEN, AND PELVIS WITH CONTRAST TECHNIQUE: Multidetector CT imaging of the chest, abdomen and pelvis was performed following the standard protocol during bolus administration of intravenous contrast. RADIATION DOSE REDUCTION: This exam was performed according to the departmental dose-optimization program which includes automated exposure control, adjustment of the mA and/or kV according to patient size and/or use of iterative reconstruction technique. CONTRAST:  OMNIPAQUE IOHEXOL 300 MG/ML  SOLN COMPARISON:  Multiple priors, most recent CT chest, abdomen and pelvis dated December 14, 2021 FINDINGS: CT CHEST FINDINGS Cardiovascular: Normal heart size. No pericardial effusion. Normal caliber thoracic aorta with severe atherosclerotic disease. Severe coronary artery calcifications. Mediastinum/Nodes: Esophagus and thyroid are unremarkable. No enlarged lymph nodes seen in the chest. Lungs/Pleura: Central airways are patent. Stable left upper lobe pulmonary nodule measuring 5 mm on series 6, image 50, stable associated postradiation change. Stable postradiation  change of the right lung apex. Persistent right lower lobe bronchial wall thickening and mucous plugging. Severe centrilobular emphysema. No pleural effusion. Musculoskeletal: Stable severe compression deformity of T3.  Remote vertebral augmentations at T4 and T7. CT ABDOMEN PELVIS FINDINGS Hepatobiliary: No focal liver abnormality is seen. No gallstones or gallbladder wall thickening. Unchanged mild dilation of the common bile duct. Pancreas: Unremarkable. No pancreatic ductal dilatation or surrounding inflammatory changes. Spleen: Normal in size without focal abnormality. Adrenals/Urinary Tract: Bilateral adrenal glands are unremarkable. No hydronephrosis or nephrolithiasis. No suspicious renal lesions. Bladder is unremarkable. Stomach/Bowel: Stomach is within normal limits. Appendix appears normal. Diverticulosis. No evidence of bowel wall thickening, distention, or inflammatory changes. Vascular/Lymphatic: Aortic atherosclerosis. No enlarged abdominal  or pelvic lymph nodes. Reproductive: Uterus and bilateral adnexa are unremarkable. Other: No abdominal wall hernia or abnormality. No abdominopelvic ascites. Musculoskeletal: No aggressive appearing osseous lesions. IMPRESSION: 1. Stable left upper lobe pulmonary nodule with associated postradiation change. Stable right apical postradiation change. No evidence of recurrent or metastatic disease. 2. No evidence of progressive disease in abdomen or pelvis. 3. Persistent right lower lobe bronchial wall thickening and mucous plugging, likely due to aspiration. 4. Stable severe compression deformity of T3. 5. Coronary artery calcifications, aortic Atherosclerosis (ICD10-I70.0) and Emphysema (ICD10-J43.9). Electronically Signed   By: Allegra Lai M.D.   On: 04/16/2022 22:21   CT Abdomen Pelvis W Contrast  Result Date: 04/16/2022 CLINICAL DATA:  Non-small cell lung cancer; * Tracking Code: BO * EXAM: CT CHEST, ABDOMEN, AND PELVIS WITH CONTRAST TECHNIQUE:  Multidetector CT imaging of the chest, abdomen and pelvis was performed following the standard protocol during bolus administration of intravenous contrast. RADIATION DOSE REDUCTION: This exam was performed according to the departmental dose-optimization program which includes automated exposure control, adjustment of the mA and/or kV according to patient size and/or use of iterative reconstruction technique. CONTRAST:  OMNIPAQUE IOHEXOL 300 MG/ML  SOLN COMPARISON:  Multiple priors, most recent CT chest, abdomen and pelvis dated December 14, 2021 FINDINGS: CT CHEST FINDINGS Cardiovascular: Normal heart size. No pericardial effusion. Normal caliber thoracic aorta with severe atherosclerotic disease. Severe coronary artery calcifications. Mediastinum/Nodes: Esophagus and thyroid are unremarkable. No enlarged lymph nodes seen in the chest. Lungs/Pleura: Central airways are patent. Stable left upper lobe pulmonary nodule measuring 5 mm on series 6, image 50, stable associated postradiation change. Stable postradiation change of the right lung apex. Persistent right lower lobe bronchial wall thickening and mucous plugging. Severe centrilobular emphysema. No pleural effusion. Musculoskeletal: Stable severe compression deformity of T3.  Remote vertebral augmentations at T4 and T7. CT ABDOMEN PELVIS FINDINGS Hepatobiliary: No focal liver abnormality is seen. No gallstones or gallbladder wall thickening. Unchanged mild dilation of the common bile duct. Pancreas: Unremarkable. No pancreatic ductal dilatation or surrounding inflammatory changes. Spleen: Normal in size without focal abnormality. Adrenals/Urinary Tract: Bilateral adrenal glands are unremarkable. No hydronephrosis or nephrolithiasis. No suspicious renal lesions. Bladder is unremarkable. Stomach/Bowel: Stomach is within normal limits. Appendix appears normal. Diverticulosis. No evidence of bowel wall thickening, distention, or inflammatory changes.  Vascular/Lymphatic: Aortic atherosclerosis. No enlarged abdominal or pelvic lymph nodes. Reproductive: Uterus and bilateral adnexa are unremarkable. Other: No abdominal wall hernia or abnormality. No abdominopelvic ascites. Musculoskeletal: No aggressive appearing osseous lesions. IMPRESSION: 1. Stable left upper lobe pulmonary nodule with associated postradiation change. Stable right apical postradiation change. No evidence of recurrent or metastatic disease. 2. No evidence of progressive disease in abdomen or pelvis. 3. Persistent right lower lobe bronchial wall thickening and mucous plugging, likely due to aspiration. 4. Stable severe compression deformity of T3. 5. Coronary artery calcifications, aortic Atherosclerosis (ICD10-I70.0) and Emphysema (ICD10-J43.9). Electronically Signed   By: Allegra Lai M.D.   On: 04/16/2022 22:21    ASSESSMENT AND PLAN: This is a very pleasant 72 years old white female with metastatic non-small cell lung cancer diagnosed in October 2007 status post concurrent chemoradiation followed by consolidation chemotherapy and she is currently on maintenance treatment with single agent Alimta status post 233 cycles. The patient has been tolerating this treatment well but recently she started having more nausea as well as fatigue. Her treatment is currently on hold and she has been on observation for the last 11 months and feeling well.  She was found on previous CT scan of the chest in June 2023 to have slight further increase in the size of the poorly marginated irregular solid 1.7 cm anterior medial left upper lobe pulmonary nodule suspicious for metachronous primary bronchogenic carcinoma versus solitary metastasis. The patient underwent SBRT to this lesion and she tolerated it fairly well. The patient is currently on observation and she is feeling fine with no concerning complaints except for occasional nausea. She had repeat CT scan of the chest, abdomen and pelvis  performed recently.  I personally and independently reviewed the scan and discussed the result with the patient and her daughter. Her scan showed no concerning findings for disease progression. I recommended for her to continue on observation with repeat blood work in 3 months. For the pain management she is currently on MS Contin and morphine sulfate for breakthrough pain.  I will give her a refill of MS Contin today but I will also refer her to the palliative care clinic for future management of her pain issues For the nausea, I will give her a refill of Zofran ODT. The patient was advised to call immediately if she has any other concerning symptoms in the interval. The patient voices understanding of current disease status and treatment options and is in agreement with the current care plan. All questions were answered. The patient knows to call the clinic with any problems, questions or concerns. We can certainly see the patient much sooner if necessary. The total time spent in the appointment was 30 minutes.   Disclaimer: This note was dictated with voice recognition software. Similar sounding words can inadvertently be transcribed and may not be corrected upon review.

## 2022-05-01 ENCOUNTER — Other Ambulatory Visit: Payer: Self-pay | Admitting: Family Medicine

## 2022-05-20 ENCOUNTER — Other Ambulatory Visit: Payer: Self-pay | Admitting: Physician Assistant

## 2022-05-20 DIAGNOSIS — C3410 Malignant neoplasm of upper lobe, unspecified bronchus or lung: Secondary | ICD-10-CM

## 2022-05-24 NOTE — Progress Notes (Unsigned)
Palliative Medicine [facility]  [phone]) [phone] Fax:([phone]   Name: Danielle [name] Date: 05/24/2022 MRN: [id number]  DOB: 14-Jul-1950  Patient Care Team: Excell Seltzer, MD as PCP - General Si Gaul, MD as Consulting Physician (Oncology) Phil Dopp, [facility] as Pharmacist (Pharmacist)    REASON FOR CONSULTATION: Danielle [name] is a 72 y.o. 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 Past medical history also includes COPD, diabetes mellitus, insomnia, anemia, as well as a cerebral infarct(10/2014). Palliative ask to see for symptom and pain management and goals of care.    SOCIAL HISTORY:     reports that she quit smoking about 16 years ago. Her smoking use included cigarettes. She has a 60.00 pack-year smoking history. She has never used smokeless tobacco. She reports that she does not drink alcohol and does not use drugs.  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   LOOP RECORDER IMPLANT N/A 04/19/2014    Procedure: LOOP RECORDER IMPLANT;  Surgeon: Hillis Range, MD;  Location: [facility] CATH LAB;  Service: Cardiovascular;  Laterality: N/A;   OTHER SURGICAL HISTORY  2008   Gamma knife surgery to remove brain met    PORTACATH PLACEMENT  -[name]   TIP IN CAVOATRIAL JUNCTION   TEE WITHOUT CARDIOVERSION N/A 04/19/2014   Procedure: TRANSESOPHAGEAL ECHOCARDIOGRAM (TEE);  Surgeon: Laurey Morale, MD;  Location: [facility] ENDOSCOPY;  Service: Cardiovascular;  Laterality: N/A;    HEMATOLOGY/ONCOLOGY HISTORY:  Oncology History   No history exists.    ALLERGIES:  is allergic to contrast media [iodinated contrast media], iohexol, sulfa drugs cross reactors, and co-trimoxazole injection [sulfamethoxazole-trimethoprim].  MEDICATIONS:  Current Outpatient Medications  Medication Sig Dispense Refill   albuterol (VENTOLIN HFA) 108 (90 Base) MCG/ACT inhaler TAKE 2 PUFFS BY MOUTH EVERY 6 HOURS AS NEEDED FOR WHEEZE OR SHORTNESS OF BREATH 8.5 each 5   alendronate (FOSAMAX) 70 MG tablet TAKE 1 TABLET BY MOUTH EVERY 7 (SEVEN) DAYS. TAKE WITH A FULL GLASS OF WATER ON AN EMPTY STOMACH. 12 tablet 1   amLODipine (NORVASC) 10 MG tablet TAKE 1 TABLET BY MOUTH EVERY DAY 90 tablet 1   aspirin 325 MG EC tablet TAKE 1 TABLET BY MOUTH EVERY DAY 90 tablet 0   atorvastatin (LIPITOR) 40 MG tablet TAKE 1 TABLET BY MOUTH EVERY DAY AT 6 PM 90 tablet 0   Cholecalciferol (VITAMIN D3) 50 MCG (2000 UT)  TABS Take by mouth.     diphenhydrAMINE (BENADRYL) 25 MG tablet Take 1 tablet (25 mg total) by mouth as directed. 2 hours before CT scan. 5 tablet 0   FeFum-FePoly-FA-B Cmp-C-Biot (FOLIVANE-PLUS) CAPS TAKE 1 CAPSULE BY MOUTH EVERY DAY IN THE MORNING 90 capsule 3   fluticasone (FLONASE) 50 MCG/ACT nasal spray Place 1-2 sprays into both nostrils daily as needed. 16 g 5   folic acid (FOLVITE) 1 MG tablet TAKE 1 TABLET BY MOUTH EVERY DAY 90 tablet 1   furosemide (LASIX) 20 MG tablet TAKE 1 TABLET BY MOUTH EVERY DAY AS NEEDED FOR SWELLING 20 tablet  1   INCRUSE ELLIPTA 62.5 MCG/ACT AEPB INHALE 1 PUFF BY MOUTH EVERY DAY 30 each 5   lidocaine-prilocaine (EMLA) cream APPLY TO AFFECTED AREA FOR 1 DOSE 30 g 2   lisinopril (ZESTRIL) 20 MG tablet TAKE 2 TABLETS (40 MG TOTAL) BY MOUTH DAILY. 180 tablet 1   morphine (MS CONTIN) 60 MG 12 hr tablet Take 1 tablet (60 mg total) by mouth 2 (two) times daily. 60 tablet 0   morphine (MSIR) 30 MG tablet Take 1 tablet (30 mg total) by mouth every 4 (four) hours as needed (breakthrough pain). 60 tablet 0   Olopatadine HCl 0.2 % SOLN INSTILL 1 DROP INTO AFFECTED EYE(S) EVERY DAY 2.5 mL 1   ondansetron (ZOFRAN-ODT) 4 MG disintegrating tablet Take 1 tablet (4 mg total) by mouth every 8 (eight) hours as needed for nausea or vomiting. 20 tablet 0   predniSONE (DELTASONE) 50 MG tablet 1 tablet by mouth 13 hours ,7 hours and 1 hour  before CT scan 3 tablet 12   prochlorperazine (COMPAZINE) 10 MG tablet Take 1 tablet (10 mg total) by mouth every 6 (six) hours as needed. 30 tablet 2   triamcinolone (KENALOG) 0.025 % ointment APPLY TO AFFECTED AREA TWICE A DAY 30 g 0   No current facility-administered medications for this visit.    VITAL SIGNS: There were no vitals taken for this visit. There were no vitals filed for this visit.  Estimated body mass index is 26.3 kg/m as calculated from the following:   Height as of 09/18/21: 5' 2.75" (1.594 m).   Weight as of 04/29/22: 147 lb 4.8 oz (66.8 kg).  LABS: CBC:    Component Value Date/Time   WBC 10.0 04/15/2022 1511   WBC 11.4 (H) 09/14/2021 1142   HGB 12.7 04/15/2022 1511   HGB 11.5 (L) 12/24/2016 0816   HCT 38.5 04/15/2022 1511   HCT 37.0 12/24/2016 0816   PLT 367 04/15/2022 1511   PLT 351 12/24/2016 0816   MCV 90.8 04/15/2022 1511   MCV 99.7 12/24/2016 0816   NEUTROABS 6.6 04/15/2022 1511   NEUTROABS 5.5 12/24/2016 0816   LYMPHSABS 2.3 04/15/2022 1511   LYMPHSABS 1.5 12/24/2016 0816   MONOABS 0.9 04/15/2022 1511   MONOABS 1.1 (H) 12/24/2016 0816    EOSABS 0.1 04/15/2022 1511   EOSABS 0.1 12/24/2016 0816   BASOSABS 0.1 04/15/2022 1511   BASOSABS 0.0 12/24/2016 0816   Comprehensive Metabolic Panel:    Component Value Date/Time   NA 138 04/15/2022 1511   NA 142 12/24/2016 0816   K 4.0 04/15/2022 1511   K 4.4 12/24/2016 0816   CL 105 04/15/2022 1511   CL 106 06/30/2012 1004   CO2 27 04/15/2022 1511   CO2 28 12/24/2016 0816   BUN 20 04/15/2022 1511   BUN 15.8 12/24/2016 0816   CREATININE 0.99 04/15/2022 1511  CREATININE 1.1 12/24/2016 0816   GLUCOSE 121 (H) 04/15/2022 1511   GLUCOSE 152 (H) 12/24/2016 0816   GLUCOSE 108 (H) 06/30/2012 1004   CALCIUM 10.1 04/15/2022 1511   CALCIUM 9.6 12/24/2016 0816   AST 13 (L) 04/15/2022 1511   AST 20 12/24/2016 0816   ALT 7 04/15/2022 1511   ALT 11 12/24/2016 0816   ALKPHOS 64 04/15/2022 1511   ALKPHOS 80 12/24/2016 0816   BILITOT 0.3 04/15/2022 1511   BILITOT 0.34 12/24/2016 0816   PROT 7.3 04/15/2022 1511   PROT 7.1 12/24/2016 0816   ALBUMIN 3.7 04/15/2022 1511   ALBUMIN 3.3 (L) 12/24/2016 0816    RADIOGRAPHIC STUDIES: No results found.  PERFORMANCE STATUS (ECOG) : {CHL ONC ECOG WU:[id number]}  Review of Systems Unless otherwise noted, a complete review of systems is negative.  Physical Exam General: NAD Cardiovascular: regular rate and rhythm Pulmonary: clear ant fields Abdomen: soft, nontender, + bowel sounds Extremities: no edema, no joint deformities Skin: no rashes Neurological:  IMPRESSION: *** I introduced myself, Maygan RN, and Palliative's role in collaboration with the oncology team. Concept of Palliative Care was introduced as specialized medical care for people and their families living with serious illness.  It focuses on providing relief from the symptoms and stress of a serious illness.  The goal is to improve quality of life for both the patient and the family. Values and goals of care important to patient and family were attempted to be elicited.     We discussed *** current illness and what it means in the larger context of *** on-going co-morbidities. Natural disease trajectory and expectations were discussed.  I discussed the importance of continued conversation with family and their medical providers regarding overall plan of care and treatment options, ensuring decisions are within the context of the patients values and GOCs.  PLAN: Established therapeutic relationship. Education provided on palliative's role in collaboration with their Oncology/Radiation team. I will plan to see patient back in 2-4 weeks in collaboration to other oncology appointments.    Patient expressed understanding and was in agreement with this plan. She also understands that She can call the clinic at any time with any questions, concerns, or complaints.   Thank you for your referral and allowing Palliative to assist in Mrs. Harvie Bridge Gilberg's care.   Number and complexity of problems addressed: ***HIGH - 1 or more chronic illnesses with SEVERE exacerbation, progression, or side effects of treatment - advanced cancer, pain. Any controlled substances utilized were prescribed in the context of palliative care.   Visit consisted of counseling and education dealing with the complex and emotionally intense issues of symptom management and palliative care in the setting of serious and potentially life-threatening illness.Greater than 50%  of this time was spent counseling and coordinating care related to the above assessment and plan.  Signed by: Willette Alma, AGPCNP-BC Palliative Medicine Team/[facility]   *Please note that this is a verbal dictation therefore any spelling or grammatical errors are due to the "Dragon Medical One" system interpretation.

## 2022-05-25 ENCOUNTER — Other Ambulatory Visit: Payer: Self-pay | Admitting: Family Medicine

## 2022-05-27 ENCOUNTER — Encounter: Payer: Self-pay | Admitting: Nurse Practitioner

## 2022-05-27 ENCOUNTER — Other Ambulatory Visit: Payer: Self-pay

## 2022-05-27 ENCOUNTER — Inpatient Hospital Stay: Payer: 59 | Attending: Nurse Practitioner | Admitting: Nurse Practitioner

## 2022-05-27 VITALS — BP 149/60 | HR 85 | Temp 98.7°F | Resp 18

## 2022-05-27 DIAGNOSIS — K5903 Drug induced constipation: Secondary | ICD-10-CM | POA: Diagnosis not present

## 2022-05-27 DIAGNOSIS — G893 Neoplasm related pain (acute) (chronic): Secondary | ICD-10-CM | POA: Diagnosis not present

## 2022-05-27 DIAGNOSIS — Z515 Encounter for palliative care: Secondary | ICD-10-CM

## 2022-05-27 DIAGNOSIS — C3490 Malignant neoplasm of unspecified part of unspecified bronchus or lung: Secondary | ICD-10-CM

## 2022-05-27 DIAGNOSIS — R530 Neoplastic (malignant) related fatigue: Secondary | ICD-10-CM

## 2022-05-27 MED ORDER — XTAMPZA ER 18 MG PO C12A
18.0000 mg | EXTENDED_RELEASE_CAPSULE | Freq: Three times a day (TID) | ORAL | 0 refills | Status: DC
Start: 2022-05-27 — End: 2022-06-06

## 2022-05-27 NOTE — Telephone Encounter (Signed)
Last office visit 09/14/21 for MWV.  Last refilled 02/04/2022 for #90 with no refills.   Last Lipid 09/04/2020.  Refill?

## 2022-05-28 ENCOUNTER — Encounter: Payer: Self-pay | Admitting: Internal Medicine

## 2022-05-31 ENCOUNTER — Telehealth: Payer: Self-pay | Admitting: Nurse Practitioner

## 2022-05-31 NOTE — Telephone Encounter (Signed)
Scheduled appointment per 5/20 los. Left voicemail.  

## 2022-06-05 ENCOUNTER — Other Ambulatory Visit: Payer: Self-pay

## 2022-06-05 NOTE — Telephone Encounter (Signed)
Pt called for refill, see new orders.  

## 2022-06-06 ENCOUNTER — Encounter: Payer: Self-pay | Admitting: Nurse Practitioner

## 2022-06-06 ENCOUNTER — Inpatient Hospital Stay (HOSPITAL_BASED_OUTPATIENT_CLINIC_OR_DEPARTMENT_OTHER): Payer: 59 | Admitting: Nurse Practitioner

## 2022-06-06 DIAGNOSIS — C3490 Malignant neoplasm of unspecified part of unspecified bronchus or lung: Secondary | ICD-10-CM | POA: Diagnosis not present

## 2022-06-06 DIAGNOSIS — R530 Neoplastic (malignant) related fatigue: Secondary | ICD-10-CM | POA: Diagnosis not present

## 2022-06-06 DIAGNOSIS — C3410 Malignant neoplasm of upper lobe, unspecified bronchus or lung: Secondary | ICD-10-CM | POA: Diagnosis not present

## 2022-06-06 DIAGNOSIS — G893 Neoplasm related pain (acute) (chronic): Secondary | ICD-10-CM | POA: Diagnosis not present

## 2022-06-06 DIAGNOSIS — K5903 Drug induced constipation: Secondary | ICD-10-CM

## 2022-06-06 DIAGNOSIS — Z515 Encounter for palliative care: Secondary | ICD-10-CM

## 2022-06-06 MED ORDER — MORPHINE SULFATE 30 MG PO TABS
30.0000 mg | ORAL_TABLET | ORAL | 0 refills | Status: DC | PRN
Start: 2022-06-06 — End: 2022-09-19

## 2022-06-06 MED ORDER — XTAMPZA ER 18 MG PO C12A
18.0000 mg | EXTENDED_RELEASE_CAPSULE | Freq: Three times a day (TID) | ORAL | 0 refills | Status: DC
Start: 2022-06-10 — End: 2022-07-02

## 2022-06-06 NOTE — Progress Notes (Signed)
Palliative Medicine [facility]  Telephone:(336) [phone] Fax:(336) [phone]   Name: Danielle [name] Date: 06/06/2022 MRN: [id number]  DOB: 1950-09-12  Patient Care Team: Excell Seltzer, MD as PCP - General Si Gaul, MD as Consulting Physician (Oncology) Vilinda Flake, [facility] as Pharmacist (Pharmacist) Pickenpack-Cousar, Arty Baumgartner, NP as Nurse Practitioner (Nurse Practitioner)   I connected with Danielle [name] on 06/06/22 at 11:30 AM EDT by phone and verified that I am speaking with the correct person using two identifiers.   I discussed the limitations, risks, security and privacy concerns of performing an evaluation and management service by telemedicine and the availability of in-person appointments. I also discussed with the patient that there may be a patient responsible charge related to this service. The patient expressed understanding and agreed to proceed.   Other persons participating in the visit and their role in the encounter: Kandi, Daughter   Patient's location: home  Provider's location: [facility]   Chief Complaint: f/u of symptom management    INTERVAL HISTORY: Danielle [name] is a 72 y.o. 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 Past medical history also includes COPD, diabetes mellitus, insomnia, anemia, as well as a cerebral infarct(10/2014). Palliative ask to see for symptom and pain management and goals of care.   SOCIAL HISTORY:     reports that she quit smoking about 16 years ago. Her smoking use included cigarettes. She has a 60.00 pack-year smoking history. She has never used smokeless tobacco. She reports that she does not drink alcohol and does not use drugs.  ADVANCE DIRECTIVES:  None on file  CODE STATUS: Full code  PAST MEDICAL HISTORY: Past Medical History:  Diagnosis Date   Anemia    Antineoplastic chemotherapy induced anemia  01/23/2016   Carcinomas, basal cell 01/22/2019   two places on face   CVA (cerebral vascular accident) (HCC) 04/2014   pt states had 2 cva's within 2 wks   Diabetes mellitus without complication (HCC)    DJD (degenerative joint disease), cervical    Fibromyalgia    H/O: pneumonia    History of tobacco abuse quit 10/08   on nicotine patch   Hypertension    Hypokalemia    Lung cancer (HCC) dx'd 09/2005   hx of non-small cell: metastasis to brain. had chemo and radiation for lung ca   Thrush 12/11/2010    ALLERGIES:  is allergic to contrast media [iodinated contrast media], iohexol, sulfa drugs cross reactors, and co-trimoxazole injection [sulfamethoxazole-trimethoprim].  MEDICATIONS:  Current Outpatient Medications  Medication Sig Dispense Refill   albuterol (VENTOLIN HFA) 108 (90 Base) MCG/ACT inhaler TAKE 2 PUFFS BY MOUTH EVERY 6 HOURS AS NEEDED FOR WHEEZE OR SHORTNESS OF BREATH 8.5 each 5   alendronate (FOSAMAX) 70 MG tablet TAKE 1 TABLET BY MOUTH EVERY 7 (SEVEN) DAYS. TAKE WITH A FULL GLASS OF WATER ON AN EMPTY STOMACH. 12 tablet 1   amLODipine (NORVASC) 10 MG tablet TAKE 1 TABLET BY MOUTH EVERY DAY 90 tablet 1   aspirin 325 MG EC tablet TAKE 1 TABLET BY MOUTH EVERY DAY 90 tablet 0   atorvastatin (LIPITOR) 40 MG tablet TAKE 1 TABLET BY MOUTH EVERY DAY AT 6 PM 90 tablet 0   Cholecalciferol (VITAMIN D3) 50 MCG (2000 UT) TABS Take by mouth.     diphenhydrAMINE (BENADRYL) 25 MG tablet Take 1 tablet (25 mg total) by mouth as directed. 2 hours  before CT scan. 5 tablet 0   FeFum-FePoly-FA-B Cmp-C-Biot (FOLIVANE-PLUS) CAPS TAKE 1 CAPSULE BY MOUTH EVERY DAY IN THE MORNING 90 capsule 3   fluticasone (FLONASE) 50 MCG/ACT nasal spray Place 1-2 sprays into both nostrils daily as needed. 16 g 5   folic acid (FOLVITE) 1 MG tablet TAKE 1 TABLET BY MOUTH EVERY DAY 90 tablet 1   furosemide (LASIX) 20 MG tablet TAKE 1 TABLET BY MOUTH EVERY DAY AS NEEDED FOR SWELLING 20 tablet 1   INCRUSE ELLIPTA 62.5  MCG/ACT AEPB INHALE 1 PUFF BY MOUTH EVERY DAY 30 each 5   lidocaine-prilocaine (EMLA) cream APPLY TO AFFECTED AREA FOR 1 DOSE 30 g 2   lisinopril (ZESTRIL) 20 MG tablet TAKE 2 TABLETS (40 MG TOTAL) BY MOUTH DAILY. 180 tablet 1   morphine (MSIR) 30 MG tablet Take 1 tablet (30 mg total) by mouth every 4 (four) hours as needed (breakthrough pain). 60 tablet 0   Olopatadine HCl 0.2 % SOLN INSTILL 1 DROP INTO AFFECTED EYE(S) EVERY DAY 2.5 mL 1   ondansetron (ZOFRAN-ODT) 4 MG disintegrating tablet Take 1 tablet (4 mg total) by mouth every 8 (eight) hours as needed for nausea or vomiting. 20 tablet 0   oxyCODONE ER (XTAMPZA ER) 18 MG C12A Take 18 mg by mouth every 8 (eight) hours. 45 capsule 0   predniSONE (DELTASONE) 50 MG tablet 1 tablet by mouth 13 hours ,7 hours and 1 hour  before CT scan 3 tablet 12   prochlorperazine (COMPAZINE) 10 MG tablet Take 1 tablet (10 mg total) by mouth every 6 (six) hours as needed. 30 tablet 2   triamcinolone (KENALOG) 0.025 % ointment APPLY TO AFFECTED AREA TWICE A DAY 30 g 0   No current facility-administered medications for this visit.    VITAL SIGNS: There were no vitals taken for this visit. There were no vitals filed for this visit.  Estimated body mass index is 26.3 kg/m as calculated from the following:   Height as of 09/18/21: 5' 2.75" (1.594 m).   Weight as of 04/29/22: 147 lb 4.8 oz (66.8 kg).   PERFORMANCE STATUS (ECOG) : 1 - Symptomatic but completely ambulatory   IMPRESSION: I connected by phone with Danielle [name]'s daughter. No acute distress noted. Patient doing well overall. Denied nausea, vomiting, or diarrhea. Is trying to remain as active as possible given some limitations due to pain and fatigue.   Neoplasm related pain Danielle [name]'s pain is somewhat better controlled. She is tolerating current regimen without difficulty.   We discussed her current regimen at length. Xtampza 18mg  every 12 hours and MS IR as needed for breakthrough pain.  Tolerating Xtampza without side effects.   We will continue to closely monitor and adjust regimen as needed     Constipation  Improved with daily regimen.   I discussed the importance of continued conversation with family and their medical providers regarding overall plan of care and treatment options, ensuring decisions are within the context of the patients values and GOCs.  PLAN:   Xtampza 18mg  every 12 hours from MS Contin 60 mg twice daily. Morphine daily MME 120. Will allow for 25-30% cross tolerance (24mg  Xtampza) allowing room for adjustments if needed.  MS IR 30 mg as needed for breakthrough pain Pain contract on file May consider gabapentin or Cymbalta if pain remains uncontrolled once regimen is maximized.  Miralax daily for bowel regimen.  I will plan to see patient back in 2-3 weeks in collaboration to other  oncology appointments.    Patient expressed understanding and was in agreement with this plan. She also understands that She can call the clinic at any time with any questions, concerns, or complaints.   Any controlled substances utilized were prescribed in the context of palliative care. PDMP has been reviewed.    Visit consisted of counseling and education dealing with the complex and emotionally intense issues of symptom management and palliative care in the setting of serious and potentially life-threatening illness.Greater than 50%  of this time was spent counseling and coordinating care related to the above assessment and plan.  Willette Alma, AGPCNP-BC  Palliative Medicine Team/[facility]  *Please note that this is a verbal dictation therefore any spelling or grammatical errors are due to the "Dragon Medical One" system interpretation.

## 2022-06-10 ENCOUNTER — Telehealth: Payer: Self-pay | Admitting: Nurse Practitioner

## 2022-06-10 NOTE — Telephone Encounter (Signed)
Scheduled appointments per los. Left voicemail for patient.

## 2022-06-17 ENCOUNTER — Telehealth: Payer: Self-pay

## 2022-06-17 NOTE — Telephone Encounter (Signed)
Pt daughter called and LVM, attempted to call back, no answer, LVM and return number

## 2022-06-21 ENCOUNTER — Other Ambulatory Visit: Payer: Self-pay | Admitting: Family Medicine

## 2022-06-21 ENCOUNTER — Other Ambulatory Visit: Payer: Self-pay

## 2022-06-21 MED ORDER — ONDANSETRON HCL 8 MG PO TABS: 8.0000 mg | ORAL_TABLET | Freq: Three times a day (TID) | ORAL | 1 refills | Status: DC | PRN

## 2022-06-21 NOTE — Telephone Encounter (Signed)
Pt daughter called reporting that she was still unable to pick up pt oxycodone ER, this RN spoke with pt pharmacy and they reported that they have the medication and the order, they had juts not filled the medication yet. RN informed tham to fill medication. RN also updated pt daughter Flint Melter of refill status. Refill for zofran also sent in and dose increased per Lowella Bandy, NP and per pt request. No further needs at this time.

## 2022-06-28 NOTE — Progress Notes (Unsigned)
Palliative Medicine [facility]  [phone] [phone]   Name: Danielle [name] Date: 06/28/2022 MRN: [id number]  DOB: 1950/04/17  Patient Care Team: Excell Seltzer, MD as PCP - General Si Gaul, MD as Consulting Physician (Oncology) Vilinda Flake, [facility] (Inactive) as Pharmacist (Pharmacist) Pickenpack-Cousar, Arty Baumgartner, NP as Nurse Practitioner (Nurse Practitioner)   I connected with [name] on 06/28/22 at  3:00 PM EDT by phone and verified that I am speaking with the correct person using two identifiers.   I discussed the limitations, risks, security and privacy concerns of performing an evaluation and management service by telemedicine and the availability of in-person appointments. I also discussed with the patient that there may be a patient responsible charge related to this service. The patient expressed understanding and agreed to proceed.   Other persons participating in the visit and their role in the encounter: Kandi, Daughter   Patient's location: home  Provider's location: [facility]   Chief Complaint: f/u of symptom management    INTERVAL HISTORY: Danielle [name] is a 72 y.o. 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 Past medical history also includes COPD, diabetes mellitus, insomnia, anemia, as well as a cerebral infarct(10/2014). Palliative ask to see for symptom and pain management and goals of care.   SOCIAL HISTORY:     reports that she quit smoking about 16 years ago. Her smoking use included cigarettes. She has a 60.00 pack-year smoking history. She has never used smokeless tobacco. She reports that she does not drink alcohol and does not use drugs.  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 had chemo and radiation for lung ca   Thrush 12/11/2010    ALLERGIES:  is allergic to contrast media [iodinated contrast media], iohexol, sulfa drugs cross reactors, and co-trimoxazole injection [sulfamethoxazole-trimethoprim].  MEDICATIONS:  Current Outpatient Medications  Medication Sig Dispense Refill   albuterol (VENTOLIN HFA) 108 (90 Base) MCG/ACT inhaler TAKE 2 PUFFS BY MOUTH EVERY 6 HOURS AS NEEDED FOR WHEEZE OR SHORTNESS OF BREATH 8.5 each 5   alendronate (FOSAMAX) 70 MG tablet TAKE 1 TABLET BY MOUTH EVERY 7 (SEVEN) DAYS. TAKE WITH A FULL GLASS OF WATER ON AN EMPTY STOMACH. 12 tablet 1   amLODipine (NORVASC) 10 MG tablet TAKE 1 TABLET BY MOUTH EVERY DAY 90 tablet 0   aspirin 325 MG EC tablet TAKE 1 TABLET BY MOUTH EVERY DAY 90 tablet 0   atorvastatin (LIPITOR) 40 MG tablet TAKE 1 TABLET BY MOUTH EVERY DAY AT 6 PM 90 tablet 0   Cholecalciferol (VITAMIN D3) 50 MCG (2000 UT) TABS Take by mouth.     diphenhydrAMINE (BENADRYL) 25 MG tablet Take 1 tablet (25 mg total) by mouth as directed.  2 hours before CT scan. 5 tablet 0   FeFum-FePoly-FA-B Cmp-C-Biot (FOLIVANE-PLUS) CAPS TAKE 1 CAPSULE BY MOUTH EVERY DAY IN THE MORNING 90 capsule 3   fluticasone (FLONASE) 50 MCG/ACT nasal spray Place 1-2 sprays into both nostrils daily as needed. 16 g 5   folic acid (FOLVITE) 1 MG tablet TAKE 1 TABLET BY MOUTH EVERY DAY 90 tablet 1   furosemide (LASIX) 20 MG tablet TAKE 1 TABLET BY MOUTH EVERY DAY AS NEEDED FOR SWELLING 20 tablet 1   INCRUSE  ELLIPTA 62.5 MCG/ACT AEPB INHALE 1 PUFF BY MOUTH EVERY DAY 30 each 5   lidocaine-prilocaine (EMLA) cream APPLY TO AFFECTED AREA FOR 1 DOSE 30 g 2   lisinopril (ZESTRIL) 20 MG tablet TAKE 2 TABLETS (40 MG TOTAL) BY MOUTH DAILY. 180 tablet 1   morphine (MSIR) 30 MG tablet Take 1 tablet (30 mg total) by mouth every 4 (four) hours as needed (breakthrough pain). 60 tablet 0   Olopatadine HCl 0.2 % SOLN INSTILL 1 DROP INTO AFFECTED EYE(S) EVERY DAY 2.5 mL 1   ondansetron (ZOFRAN) 8 MG tablet Take 1 tablet (8 mg total) by mouth every 8 (eight) hours as needed for nausea or vomiting. 30 tablet 1   oxyCODONE ER (XTAMPZA ER) 18 MG C12A Take 18 mg by mouth every 8 (eight) hours. 45 capsule 0   predniSONE (DELTASONE) 50 MG tablet 1 tablet by mouth 13 hours ,7 hours and 1 hour  before CT scan 3 tablet 12   prochlorperazine (COMPAZINE) 10 MG tablet Take 1 tablet (10 mg total) by mouth every 6 (six) hours as needed. 30 tablet 2   triamcinolone (KENALOG) 0.025 % ointment APPLY TO AFFECTED AREA TWICE A DAY 30 g 0   No current facility-administered medications for this visit.    VITAL SIGNS: There were no vitals taken for this visit. There were no vitals filed for this visit.  Estimated body mass index is 26.3 kg/m as calculated from the following:   Height as of 09/18/21: 5' 2.75" (1.594 m).   Weight as of 04/29/22: 147 lb 4.8 oz (66.8 kg).   PERFORMANCE STATUS (ECOG) : 1 - Symptomatic but completely ambulatory   IMPRESSION:  Neoplasm related pain Ms. Shafran's pain is somewhat better controlled. She is tolerating current regimen without difficulty.   We discussed her current regimen at length. Xtampza 18mg  every 12 hours and MS IR as needed for breakthrough pain. Tolerating Xtampza without side effects.   We will continue to closely monitor and adjust regimen as needed     Constipation  Improved with daily regimen.   I discussed the importance of continued conversation with family and their  medical providers regarding overall plan of care and treatment options, ensuring decisions are within the context of the patients values and GOCs.  PLAN:   Xtampza 18mg  every 12 hours from MS Contin 60 mg twice daily. Morphine daily MME 120. Will allow for 25-30% cross tolerance (24mg  Xtampza) allowing room for adjustments if needed.  MS IR 30 mg as needed for breakthrough pain Pain contract on file May consider gabapentin or Cymbalta if pain remains uncontrolled once regimen is maximized.  Miralax daily for bowel regimen.  I will plan to see patient back in 2-3 weeks in collaboration to other oncology appointments.    Patient expressed understanding and was in agreement with this plan. She also understands that She can call the clinic at any time with any questions, concerns, or complaints.   Any controlled  substances utilized were prescribed in the context of palliative care. PDMP has been reviewed.    Visit consisted of counseling and education dealing with the complex and emotionally intense issues of symptom management and palliative care in the setting of serious and potentially life-threatening illness.Greater than 50%  of this time was spent counseling and coordinating care related to the above assessment and plan.  Willette Alma, AGPCNP-BC  Palliative Medicine Team/[facility]  *Please note that this is a verbal dictation therefore any spelling or grammatical errors are due to the "Dragon [employer] One" system interpretation.

## 2022-07-02 ENCOUNTER — Encounter: Payer: Self-pay | Admitting: Nurse Practitioner

## 2022-07-02 ENCOUNTER — Inpatient Hospital Stay: Payer: 59 | Attending: Nurse Practitioner | Admitting: Nurse Practitioner

## 2022-07-02 VITALS — BP 132/61 | HR 91 | Temp 98.2°F | Resp 18 | Wt 145.9 lb

## 2022-07-02 DIAGNOSIS — Z515 Encounter for palliative care: Secondary | ICD-10-CM

## 2022-07-02 DIAGNOSIS — G893 Neoplasm related pain (acute) (chronic): Secondary | ICD-10-CM | POA: Diagnosis not present

## 2022-07-02 DIAGNOSIS — C3410 Malignant neoplasm of upper lobe, unspecified bronchus or lung: Secondary | ICD-10-CM

## 2022-07-02 DIAGNOSIS — C3490 Malignant neoplasm of unspecified part of unspecified bronchus or lung: Secondary | ICD-10-CM

## 2022-07-02 DIAGNOSIS — K5903 Drug induced constipation: Secondary | ICD-10-CM

## 2022-07-02 MED ORDER — XTAMPZA ER 18 MG PO C12A
18.0000 mg | EXTENDED_RELEASE_CAPSULE | Freq: Three times a day (TID) | ORAL | 0 refills | Status: DC
Start: 2022-07-05 — End: 2022-08-05

## 2022-07-03 ENCOUNTER — Encounter: Payer: Self-pay | Admitting: Internal Medicine

## 2022-07-04 ENCOUNTER — Telehealth: Payer: Self-pay | Admitting: Medical Oncology

## 2022-07-04 NOTE — Telephone Encounter (Signed)
Confirmed appts for July . 

## 2022-07-29 ENCOUNTER — Inpatient Hospital Stay: Payer: 59 | Attending: Nurse Practitioner | Admitting: Internal Medicine

## 2022-07-29 ENCOUNTER — Other Ambulatory Visit: Payer: Self-pay

## 2022-07-29 ENCOUNTER — Inpatient Hospital Stay: Payer: 59

## 2022-07-29 VITALS — BP 136/52 | HR 77 | Temp 97.7°F | Resp 16 | Ht 62.75 in | Wt 141.6 lb

## 2022-07-29 DIAGNOSIS — C3490 Malignant neoplasm of unspecified part of unspecified bronchus or lung: Secondary | ICD-10-CM | POA: Diagnosis not present

## 2022-07-29 DIAGNOSIS — Z79899 Other long term (current) drug therapy: Secondary | ICD-10-CM | POA: Insufficient documentation

## 2022-07-29 DIAGNOSIS — C7931 Secondary malignant neoplasm of brain: Secondary | ICD-10-CM | POA: Diagnosis not present

## 2022-07-29 DIAGNOSIS — Z923 Personal history of irradiation: Secondary | ICD-10-CM | POA: Diagnosis not present

## 2022-07-29 DIAGNOSIS — D72829 Elevated white blood cell count, unspecified: Secondary | ICD-10-CM | POA: Diagnosis not present

## 2022-07-29 DIAGNOSIS — C7951 Secondary malignant neoplasm of bone: Secondary | ICD-10-CM | POA: Diagnosis not present

## 2022-07-29 DIAGNOSIS — Z7982 Long term (current) use of aspirin: Secondary | ICD-10-CM | POA: Insufficient documentation

## 2022-07-29 DIAGNOSIS — E1165 Type 2 diabetes mellitus with hyperglycemia: Secondary | ICD-10-CM | POA: Insufficient documentation

## 2022-07-29 DIAGNOSIS — Z9221 Personal history of antineoplastic chemotherapy: Secondary | ICD-10-CM | POA: Insufficient documentation

## 2022-07-29 DIAGNOSIS — Z95828 Presence of other vascular implants and grafts: Secondary | ICD-10-CM

## 2022-07-29 LAB — CMP (CANCER CENTER ONLY)
ALT: 8 U/L (ref 0–44)
AST: 13 U/L — ABNORMAL LOW (ref 15–41)
Albumin: 3.8 g/dL (ref 3.5–5.0)
Alkaline Phosphatase: 68 U/L (ref 38–126)
Anion gap: 6 (ref 5–15)
BUN: 29 mg/dL — ABNORMAL HIGH (ref 8–23)
CO2: 27 mmol/L (ref 22–32)
Calcium: 9.3 mg/dL (ref 8.9–10.3)
Chloride: 105 mmol/L (ref 98–111)
Creatinine: 1.51 mg/dL — ABNORMAL HIGH (ref 0.44–1.00)
GFR, Estimated: 37 mL/min — ABNORMAL LOW (ref >60)
Glucose, Bld: 166 mg/dL — ABNORMAL HIGH (ref 70–99)
Potassium: 4.1 mmol/L (ref 3.5–5.1)
Sodium: 138 mmol/L (ref 135–145)
Total Bilirubin: 0.3 mg/dL (ref 0.3–1.2)
Total Protein: 6.5 g/dL (ref 6.5–8.1)

## 2022-07-29 LAB — CBC WITH DIFFERENTIAL (CANCER CENTER ONLY)
Abs Immature Granulocytes: 0.04 10*3/uL (ref 0.00–0.07)
Basophils Absolute: 0.1 10*3/uL (ref 0.0–0.1)
Basophils Relative: 1 %
Eosinophils Absolute: 0.1 10*3/uL (ref 0.0–0.5)
Eosinophils Relative: 1 %
HCT: 37.7 % (ref 36.0–46.0)
Hemoglobin: 12.5 g/dL (ref 12.0–15.0)
Immature Granulocytes: 0 %
Lymphocytes Relative: 25 %
Lymphs Abs: 3 10*3/uL (ref 0.7–4.0)
MCH: 30.2 pg (ref 26.0–34.0)
MCHC: 33.2 g/dL (ref 30.0–36.0)
MCV: 91.1 fL (ref 80.0–100.0)
Monocytes Absolute: 1.1 10*3/uL — ABNORMAL HIGH (ref 0.1–1.0)
Monocytes Relative: 9 %
Neutro Abs: 7.9 10*3/uL — ABNORMAL HIGH (ref 1.7–7.7)
Neutrophils Relative %: 64 %
Platelet Count: 336 10*3/uL (ref 150–400)
RBC: 4.14 MIL/uL (ref 3.87–5.11)
RDW: 14 % (ref 11.5–15.5)
WBC Count: 12.2 10*3/uL — ABNORMAL HIGH (ref 4.0–10.5)
nRBC: 0 % (ref 0.0–0.2)

## 2022-07-29 MED ORDER — SODIUM CHLORIDE 0.9% FLUSH
10.0000 mL | INTRAVENOUS | Status: DC | PRN
  Administered 2022-07-29: 10 mL via INTRAVENOUS

## 2022-07-29 MED ORDER — HEPARIN SOD (PORK) LOCK FLUSH 100 UNIT/ML IV SOLN
500.0000 [IU] | Freq: Once | INTRAVENOUS | Status: AC
  Administered 2022-07-29: 500 [IU]

## 2022-07-29 NOTE — Progress Notes (Signed)
Danielle [name] [phone]   [phone]  OFFICE PROGRESS NOTE  Danielle Seltzer, Danielle [name] [address] [place] [address]  DIAGNOSIS: Metastatic non-small cell lung cancer initially diagnosed as locally advanced stage IIIB with a right Pancoast tumor involving the vertebral body as well as prominent canal invasion with spinal cord compression in October 2007. The patient also has metastatic disease to the brain in April 2008.  PRIOR THERAPY: Status post concurrent chemoradiation with weekly carboplatin and paclitaxel, last dose was given November 18, 2005. 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 Status post gamma knife radiotherapy to a solitary brain lesion located in the superior frontal area of the brain at [facility] in April of 2008. Status post palliative radiotherapy to the lateral abdominal wall metastatic lesion under the care of Dr. Mitzi Hansen, completed March of 2009. Status post 6 cycles of systemic chemotherapy with carboplatin and Alimta. Last dose was given July 26, 2007 with disease stabilization. Gamma knife stereotactic radiotherapy to 2 brain lesions one involving the right frontal dural based as well as right parietal lesion performed on 05/07/2012 under the care of Dr. Johny Drilling at [name]. Maintenance systemic chemotherapy with Alimta 500 MG/M2 every 3 weeks, status post 233 cycles.  Treatment is currently on hold since January 30, 2021.  SBRT to enlarging left upper lobe lung nodule under the care of Dr. Mitzi Hansen completed on July 27, 2021.  CURRENT THERAPY: Observation.  INTERVAL HISTORY: Danielle [name] 72 y.o. female returns to the clinic today for follow-up visit accompanied by her daughter Danielle [name].  The patient is feeling fine today with no concerning complaints except for mild fatigue.  She denied having any chest pain, shortness of  breath, cough or hemoptysis.  She has no nausea, vomiting, diarrhea or constipation.  She has no headache or visual changes.  She denied having any fever or chills.  She has been in observation now for around 18 months.  She is here today for evaluation and repeat blood work.  MEDICAL HISTORY: Past Medical History:  Diagnosis Date   Anemia    Antineoplastic chemotherapy induced anemia 01/23/2016   Carcinomas, basal cell 01/22/2019   two places on face   CVA (cerebral vascular accident) (HCC) 04/2014   pt states had 2 cva's within 2 wks   Diabetes mellitus without complication (HCC)    DJD (degenerative joint disease), cervical    Fibromyalgia    H/O: pneumonia    History of tobacco abuse quit 10/08   on nicotine patch   Hypertension    Hypokalemia    Lung cancer (HCC) dx'd 09/2005   hx of non-small cell: metastasis to brain. had chemo and radiation for lung ca   Thrush 12/11/2010    ALLERGIES:  is allergic to contrast media [iodinated contrast media], iohexol, sulfa drugs cross reactors, and co-trimoxazole injection [sulfamethoxazole-trimethoprim].  MEDICATIONS:  Current Outpatient Medications  Medication Sig Dispense Refill   albuterol (VENTOLIN HFA) 108 (90 Base) MCG/ACT inhaler TAKE 2 PUFFS BY MOUTH EVERY 6 HOURS AS NEEDED FOR WHEEZE OR SHORTNESS OF BREATH 8.5 each 5   alendronate (FOSAMAX) 70 MG tablet TAKE 1 TABLET BY MOUTH EVERY 7 (SEVEN) DAYS. TAKE WITH A FULL GLASS OF WATER ON AN EMPTY STOMACH. 12 tablet 1   amLODipine (NORVASC) 10 MG tablet TAKE 1 TABLET BY MOUTH EVERY DAY 90 tablet 0   aspirin 325 MG EC tablet  TAKE 1 TABLET BY MOUTH EVERY DAY 90 tablet 0   atorvastatin (LIPITOR) 40 MG tablet TAKE 1 TABLET BY MOUTH EVERY DAY AT 6 PM 90 tablet 0   Cholecalciferol (VITAMIN D3) 50 MCG (2000 UT) TABS Take by mouth.     diphenhydrAMINE (BENADRYL) 25 MG tablet Take 1 tablet (25 mg total) by mouth as directed. 2 hours before CT scan. 5 tablet 0   FeFum-FePoly-FA-B Cmp-C-Biot  (FOLIVANE-PLUS) CAPS TAKE 1 CAPSULE BY MOUTH EVERY DAY IN THE MORNING 90 capsule 3   fluticasone (FLONASE) 50 MCG/ACT nasal spray Place 1-2 sprays into both nostrils daily as needed. 16 g 5   folic acid (FOLVITE) 1 MG tablet TAKE 1 TABLET BY MOUTH EVERY DAY 90 tablet 1   furosemide (LASIX) 20 MG tablet TAKE 1 TABLET BY MOUTH EVERY DAY AS NEEDED FOR SWELLING 20 tablet 1   INCRUSE ELLIPTA 62.5 MCG/ACT AEPB INHALE 1 PUFF BY MOUTH EVERY DAY 30 each 5   lidocaine-prilocaine (EMLA) cream APPLY TO AFFECTED AREA FOR 1 DOSE 30 g 2   lisinopril (ZESTRIL) 20 MG tablet TAKE 2 TABLETS (40 MG TOTAL) BY MOUTH DAILY. 180 tablet 1   morphine (MSIR) 30 MG tablet Take 1 tablet (30 mg total) by mouth every 4 (four) hours as needed (breakthrough pain). 60 tablet 0   Olopatadine HCl 0.2 % SOLN INSTILL 1 DROP INTO AFFECTED EYE(S) EVERY DAY 2.5 mL 1   ondansetron (ZOFRAN) 8 MG tablet Take 1 tablet (8 mg total) by mouth every 8 (eight) hours as needed for nausea or vomiting. 30 tablet 1   oxyCODONE ER (XTAMPZA ER) 18 MG C12A Take 18 mg by mouth every 8 (eight) hours. 90 capsule 0   predniSONE (DELTASONE) 50 MG tablet 1 tablet by mouth 13 hours ,7 hours and 1 hour  before CT scan 3 tablet 12   prochlorperazine (COMPAZINE) 10 MG tablet Take 1 tablet (10 mg total) by mouth every 6 (six) hours as needed. 30 tablet 2   triamcinolone (KENALOG) 0.025 % ointment APPLY TO AFFECTED AREA TWICE A DAY 30 g 0   No current facility-administered medications for this visit.   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   LOOP RECORDER  IMPLANT N/A 04/19/2014   Procedure: LOOP RECORDER IMPLANT;  Surgeon: Hillis Range, Danielle [name];  Location: [facility] CATH LAB;  Service: Cardiovascular;  Laterality: N/A;   OTHER SURGICAL HISTORY  2008   Gamma knife surgery to remove brain met    PORTACATH PLACEMENT  -[name]   TIP IN CAVOATRIAL JUNCTION   TEE WITHOUT CARDIOVERSION N/A 04/19/2014   Procedure: TRANSESOPHAGEAL ECHOCARDIOGRAM (TEE);  Surgeon: Laurey Morale, Danielle [name];  Location: [facility] ENDOSCOPY;  Service: Cardiovascular;  Laterality: N/A;    REVIEW OF SYSTEMS:  A comprehensive review of systems was negative except for: Constitutional: positive for fatigue   PHYSICAL EXAMINATION: General appearance: alert, cooperative, and no distress Head: Normocephalic, without obvious abnormality, atraumatic Neck: no adenopathy, no JVD, supple, symmetrical, trachea midline, and thyroid not enlarged, symmetric, no tenderness/mass/nodules Lymph  nodes: Cervical, supraclavicular, and axillary nodes normal. Resp: clear to auscultation bilaterally Back: symmetric, no curvature. ROM normal. No CVA tenderness. Cardio: regular rate and rhythm, S1, S2 normal, no murmur, click, rub or gallop GI: soft, non-tender; bowel sounds normal; no masses,  no organomegaly Extremities: extremities normal, atraumatic, no cyanosis or edema  ECOG PERFORMANCE STATUS: 1 - Symptomatic but completely ambulatory  Blood pressure (!) 136/52, pulse 77, temperature 97.7 F (36.5 C), temperature source Oral, resp. rate 16, height 5' 2.75" (1.594 m), weight 141 lb 9.6 oz (64.2 kg), SpO2 99%.  LABORATORY DATA: Lab Results  Component Value Date   WBC 12.2 (H) 07/29/2022   HGB 12.5 07/29/2022   HCT 37.7 07/29/2022   MCV 91.1 07/29/2022   PLT 336 07/29/2022      Chemistry      Component Value Date/Time   NA 138 04/15/2022 1511   NA 142 12/24/2016 0816   K 4.0 04/15/2022 1511   K 4.4 12/24/2016 0816   CL 105 04/15/2022 1511   CL 106 06/30/2012 1004   CO2 27 04/15/2022 1511   CO2 28  12/24/2016 0816   BUN 20 04/15/2022 1511   BUN 15.8 12/24/2016 0816   CREATININE 0.99 04/15/2022 1511   CREATININE 1.1 12/24/2016 0816      Component Value Date/Time   CALCIUM 10.1 04/15/2022 1511   CALCIUM 9.6 12/24/2016 0816   ALKPHOS 64 04/15/2022 1511   ALKPHOS 80 12/24/2016 0816   AST 13 (L) 04/15/2022 1511   AST 20 12/24/2016 0816   ALT 7 04/15/2022 1511   ALT 11 12/24/2016 0816   BILITOT 0.3 04/15/2022 1511   BILITOT 0.34 12/24/2016 0816       RADIOGRAPHIC STUDIES: No results found.  ASSESSMENT AND PLAN: This is a very pleasant 72 years old white female with metastatic non-small cell lung cancer diagnosed in October 2007 status post concurrent chemoradiation followed by consolidation chemotherapy and she is currently on maintenance treatment with single agent Alimta status post 233 cycles. The patient has been tolerating this treatment well but recently she started having more nausea as well as fatigue. Her treatment is currently on hold and she has been on observation for the last 11 months and feeling well. She was found on previous CT scan of the chest in June 2023 to have slight further increase in the size of the poorly marginated irregular solid 1.7 cm anterior medial left upper lobe pulmonary nodule suspicious for metachronous primary bronchogenic carcinoma versus solitary metastasis. The patient underwent SBRT to this lesion and she tolerated it fairly well. The patient has been on observation for the last 18 months and she is feeling fine with no concerning complaints except for mild fatigue. Repeat CBC today is unremarkable except for mild leukocytosis.  Comprehensive metabolic panel showed elevated blood sugar as well as serum creatinine. I recommended for the patient to continue on observation with repeat CT scan of the chest, abdomen and pelvis in 3 months. For the pain management she is followed by the palliative care team and she is currently on treatment with  Xtampza 18 mg every 12 hours in addition morphine sulfate as needed. For the nausea, I will give her a refill of Zofran ODT. The patient was advised to call immediately if she has any other concerning symptoms in the interval. The patient voices understanding of current disease status and treatment options and is in agreement with the current care plan. All questions were answered. The patient knows to call  the clinic with any problems, questions or concerns. We can certainly see the patient much sooner if necessary. The total time spent in the appointment was 30 minutes.   Disclaimer: This note was dictated with voice recognition software. Similar sounding words can inadvertently be transcribed and may not be corrected upon review.

## 2022-07-29 NOTE — Addendum Note (Signed)
Addended by: Charma Igo on: 07/29/2022 05:11 PM   Modules accepted: Orders

## 2022-08-05 ENCOUNTER — Other Ambulatory Visit: Payer: Self-pay

## 2022-08-05 DIAGNOSIS — G893 Neoplasm related pain (acute) (chronic): Secondary | ICD-10-CM

## 2022-08-05 DIAGNOSIS — C3410 Malignant neoplasm of upper lobe, unspecified bronchus or lung: Secondary | ICD-10-CM

## 2022-08-05 DIAGNOSIS — C3490 Malignant neoplasm of unspecified part of unspecified bronchus or lung: Secondary | ICD-10-CM

## 2022-08-05 DIAGNOSIS — Z515 Encounter for palliative care: Secondary | ICD-10-CM

## 2022-08-05 MED ORDER — XTAMPZA ER 18 MG PO C12A
18.0000 mg | EXTENDED_RELEASE_CAPSULE | Freq: Three times a day (TID) | ORAL | 0 refills | Status: DC
Start: 2022-08-05 — End: 2022-09-19

## 2022-08-05 NOTE — Telephone Encounter (Signed)
Refill sent in per request.  No further needs at this time.

## 2022-08-07 NOTE — Progress Notes (Signed)
Palliative Medicine [facility]  Telephone:(336) [phone] Fax:(336) [phone]   Name: Danielle [name] Date: 08/07/2022 MRN: [id number]  DOB: 1950/08/04  Patient Care Team: Excell Seltzer, MD as PCP - General Si Gaul, MD as Consulting Physician (Oncology) Vilinda Flake, [facility] (Inactive) as Pharmacist (Pharmacist) Pickenpack-Cousar, Arty Baumgartner, NP as Nurse Practitioner (Nurse Practitioner)   I connected with Danielle [name] on 08/07/22 at  2:30 PM EDT by phone and verified that I am speaking with the correct person using two identifiers.   I discussed the limitations, risks, security and privacy concerns of performing an evaluation and management service by telemedicine and the availability of in-person appointments. I also discussed with the patient that there may be a patient responsible charge related to this service. The patient expressed understanding and agreed to proceed.   Other persons participating in the visit and their role in the encounter: n/a   Patient's location: home  Provider's location: [facility]   Chief Complaint: f/u of symptom management    INTERVAL HISTORY: Danielle [name] is a 71 y.o. 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 Past medical history also includes COPD, diabetes mellitus, insomnia, anemia, as well as a cerebral infarct(10/2014). Palliative ask to see for symptom and pain management and goals of care.   SOCIAL HISTORY:     reports that she quit smoking about 16 years ago. Her smoking use included cigarettes. She started smoking about 46 years ago. She has a 60 pack-year smoking history. She has never used smokeless tobacco. She reports that she does not drink alcohol and does not use drugs.  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 had chemo and radiation for lung ca   Thrush 12/11/2010    ALLERGIES:  is allergic to contrast media [iodinated contrast media], iohexol, sulfa drugs cross reactors, and co-trimoxazole injection [sulfamethoxazole-trimethoprim].  MEDICATIONS:  Current Outpatient Medications  Medication Sig Dispense Refill   albuterol (VENTOLIN HFA) 108 (90 Base) MCG/ACT inhaler TAKE 2 PUFFS BY MOUTH EVERY 6 HOURS AS NEEDED FOR WHEEZE OR SHORTNESS OF BREATH 8.5 each 5   alendronate (FOSAMAX) 70 MG tablet TAKE 1 TABLET BY MOUTH EVERY 7 (SEVEN) DAYS. TAKE WITH A FULL GLASS OF WATER ON AN EMPTY STOMACH. 12 tablet 1   amLODipine (NORVASC) 10 MG tablet TAKE 1 TABLET BY MOUTH EVERY DAY 90 tablet 0   aspirin 325 MG EC tablet TAKE 1 TABLET BY MOUTH EVERY DAY 90 tablet 0   atorvastatin (LIPITOR) 40 MG tablet TAKE 1 TABLET BY MOUTH EVERY DAY AT 6 PM 90 tablet 0   Cholecalciferol (VITAMIN D3) 50 MCG (2000 UT) TABS Take by mouth.     diphenhydrAMINE (BENADRYL) 25 MG tablet Take 1 tablet (25  mg total) by mouth as directed. 2 hours before CT scan. 5 tablet 0   FeFum-FePoly-FA-B Cmp-C-Biot (FOLIVANE-PLUS) CAPS TAKE 1 CAPSULE BY MOUTH EVERY DAY IN THE MORNING 90 capsule 3   fluticasone (FLONASE) 50 MCG/ACT nasal spray Place 1-2 sprays into both nostrils daily as needed. 16 g 5   furosemide (LASIX) 20 MG tablet TAKE 1 TABLET BY MOUTH EVERY DAY AS NEEDED FOR SWELLING 20 tablet 1   INCRUSE ELLIPTA 62.5 MCG/ACT AEPB INHALE 1 PUFF BY MOUTH  EVERY DAY 30 each 5   lidocaine-prilocaine (EMLA) cream APPLY TO AFFECTED AREA FOR 1 DOSE 30 g 2   lisinopril (ZESTRIL) 20 MG tablet TAKE 2 TABLETS (40 MG TOTAL) BY MOUTH DAILY. 180 tablet 1   morphine (MSIR) 30 MG tablet Take 1 tablet (30 mg total) by mouth every 4 (four) hours as needed (breakthrough pain). 60 tablet 0   Olopatadine HCl 0.2 % SOLN INSTILL 1 DROP INTO AFFECTED EYE(S) EVERY DAY 2.5 mL 1   ondansetron (ZOFRAN) 8 MG tablet Take 1 tablet (8 mg total) by mouth every 8 (eight) hours as needed for nausea or vomiting. 30 tablet 1   oxyCODONE ER (XTAMPZA ER) 18 MG C12A Take 18 mg by mouth every 8 (eight) hours. 90 capsule 0   predniSONE (DELTASONE) 50 MG tablet 1 tablet by mouth 13 hours ,7 hours and 1 hour  before CT scan 3 tablet 12   prochlorperazine (COMPAZINE) 10 MG tablet Take 1 tablet (10 mg total) by mouth every 6 (six) hours as needed. 30 tablet 2   triamcinolone (KENALOG) 0.025 % ointment APPLY TO AFFECTED AREA TWICE A DAY 30 g 0   No current facility-administered medications for this visit.    VITAL SIGNS: There were no vitals taken for this visit. There were no vitals filed for this visit.  Estimated body mass index is 25.28 kg/m as calculated from the following:   Height as of 07/29/22: 5' 2.75" (1.594 m).   Weight as of 07/29/22: 141 lb 9.6 oz (64.2 kg).   PERFORMANCE STATUS (ECOG) : 1 - Symptomatic but completely ambulatory   IMPRESSION: I connected by phone with Danielle [name]. No acute distress identified. Reports she is doing well overall and is much appreciative of this. Is trying to remain as active as possible. Denies nausea, vomiting, constipation, or diarrhea. Endorsed one episode of nausea over the past month however this was an isolated event. Energy level is improving. Appetite is good. Is spending time with family. Taking things one day at a time.   Neoplasm related pain Danielle [name] reports her pain is well controlled on current regimen. Tolerating without  difficulty. No unwanted side effects.  We discussed her current regimen at length. Xtampza 18mg  every 8 hours and MS IR as needed for breakthrough pain. Not requiring breakthrough medications. Tolerating Xtampza. No changes to current regimen at this time.   We will continue to closely monitor and adjust regimen as needed. Pain contract on file.    Constipation  Improved with daily regimen.   I discussed the importance of continued conversation with family and their medical providers regarding overall plan of care and treatment options, ensuring decisions are within the context of the patients values and GOCs.  PLAN:   Xtampza 18mg  every 8 hours. Tolerating well.  MS IR 30 mg as needed for breakthrough pain Pain contract on file Can consider gabapentin or Cymbalta if pain becomes uncontrolled and regimen is maximized.  Miralax daily for bowel regimen.  I will plan to see patient back in 2-3 weeks in collaboration to other oncology appointments.    Patient expressed understanding and was in agreement with this plan. She also understands that She can call the clinic at any time with any questions, concerns, or complaints.   Any controlled substances utilized were prescribed in the context of palliative care. PDMP has been reviewed.    Visit consisted of counseling and education dealing with the complex and emotionally intense issues of symptom management and palliative care in the setting of serious and potentially life-threatening illness.Greater than 50%  of this time was spent counseling and coordinating care related to the above assessment and plan.  Willette Alma, AGPCNP-BC  Palliative Medicine Team/[facility]  *Please note that this is a verbal dictation therefore any spelling or grammatical errors are due to the "Dragon Medical One" system interpretation.

## 2022-08-08 ENCOUNTER — Inpatient Hospital Stay: Payer: 59 | Attending: Nurse Practitioner | Admitting: Nurse Practitioner

## 2022-08-08 ENCOUNTER — Encounter: Payer: Self-pay | Admitting: Nurse Practitioner

## 2022-08-08 DIAGNOSIS — G893 Neoplasm related pain (acute) (chronic): Secondary | ICD-10-CM

## 2022-08-08 DIAGNOSIS — K5903 Drug induced constipation: Secondary | ICD-10-CM | POA: Diagnosis not present

## 2022-08-08 DIAGNOSIS — Z515 Encounter for palliative care: Secondary | ICD-10-CM | POA: Diagnosis not present

## 2022-08-08 DIAGNOSIS — C3490 Malignant neoplasm of unspecified part of unspecified bronchus or lung: Secondary | ICD-10-CM

## 2022-08-09 ENCOUNTER — Encounter: Payer: Self-pay | Admitting: Internal Medicine

## 2022-08-28 ENCOUNTER — Other Ambulatory Visit: Payer: Self-pay | Admitting: Family Medicine

## 2022-08-28 NOTE — Telephone Encounter (Signed)
Lvmtcb, sent mychart message  

## 2022-08-28 NOTE — Telephone Encounter (Signed)
Last office visit 09/14/21 for MWV.  Last refilled 02/04/2022 for #90 with no refills.   Last Lipid 09/04/2020.  Refill?         Please call and schedule MWV with nurse and CPE with fasting labs prior with Dr. Ermalene Searing for after 09/15/2022.

## 2022-09-11 NOTE — Progress Notes (Signed)
Palliative Medicine [facility]  Telephone:(336) [phone] Fax:(336) [phone]   Name: Danielle [name] Date: 09/11/2022 MRN: [id number]  DOB: 13-May-1950  Patient Care Team: Excell Seltzer, MD as PCP - General Si Gaul, MD as Consulting Physician (Oncology) Vilinda Flake, [facility] (Inactive) as Pharmacist (Pharmacist) Pickenpack-Cousar, Arty Baumgartner, NP as Nurse Practitioner (Nurse Practitioner)   I connected with Danielle [name] on 09/11/22 at 11:30 AM EDT by phone and verified that I am speaking with the correct person using two identifiers.   I discussed the limitations, risks, security and privacy concerns of performing an evaluation and management service by telemedicine and the availability of in-person appointments. I also discussed with the patient that there may be a patient responsible charge related to this service. The patient expressed understanding and agreed to proceed.   Other persons participating in the visit and their role in the encounter: n/a   Patient's location: home  Provider's location: [facility]   Chief Complaint: f/u of symptom management    INTERVAL HISTORY: Danielle [name] is a 72 y.o. 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 Past medical history also includes COPD, diabetes mellitus, insomnia, anemia, as well as a cerebral infarct(10/2014). Palliative ask to see for symptom and pain management and goals of care.   SOCIAL HISTORY:     reports that she quit smoking about 16 years ago. Her smoking use included cigarettes. She started smoking about 46 years ago. She has a 60 pack-year smoking history. She has never used smokeless tobacco. She reports that she does not drink alcohol and does not use drugs.  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 had chemo and radiation for lung ca   Thrush 12/11/2010    ALLERGIES:  is allergic to contrast media [iodinated contrast media], iohexol, sulfa drugs cross reactors, and co-trimoxazole injection [sulfamethoxazole-trimethoprim].  MEDICATIONS:  Current Outpatient Medications  Medication Sig Dispense Refill   albuterol (VENTOLIN HFA) 108 (90 Base) MCG/ACT inhaler TAKE 2 PUFFS BY MOUTH EVERY 6 HOURS AS NEEDED FOR WHEEZE OR SHORTNESS OF BREATH 8.5 each 5   alendronate (FOSAMAX) 70 MG tablet TAKE 1 TABLET BY MOUTH EVERY 7 (SEVEN) DAYS. TAKE WITH A FULL GLASS OF WATER ON AN EMPTY STOMACH. 12 tablet 1   amLODipine (NORVASC) 10 MG tablet TAKE 1 TABLET BY MOUTH EVERY DAY 90 tablet 0   aspirin 325 MG EC tablet TAKE 1 TABLET BY MOUTH EVERY DAY 90 tablet 0   atorvastatin (LIPITOR) 40 MG tablet TAKE 1 TABLET BY MOUTH EVERY DAY AT 6 PM 90 tablet 0   Cholecalciferol (VITAMIN D3) 50 MCG (2000 UT) TABS Take by mouth.     diphenhydrAMINE (BENADRYL) 25 MG tablet Take 1 tablet (25 mg  total) by mouth as directed. 2 hours before CT scan. 5 tablet 0   FeFum-FePoly-FA-B Cmp-C-Biot (FOLIVANE-PLUS) CAPS TAKE 1 CAPSULE BY MOUTH EVERY DAY IN THE MORNING 90 capsule 3   fluticasone (FLONASE) 50 MCG/ACT nasal spray Place 1-2 sprays into both nostrils daily as needed. 16 g 5   furosemide (LASIX) 20 MG tablet TAKE 1 TABLET BY MOUTH EVERY DAY AS NEEDED FOR SWELLING 20 tablet 1   INCRUSE ELLIPTA 62.5 MCG/ACT AEPB INHALE 1 PUFF BY MOUTH  EVERY DAY 30 each 5   lidocaine-prilocaine (EMLA) cream APPLY TO AFFECTED AREA FOR 1 DOSE 30 g 2   lisinopril (ZESTRIL) 20 MG tablet TAKE 2 TABLETS (40 MG TOTAL) BY MOUTH DAILY. 180 tablet 1   morphine (MSIR) 30 MG tablet Take 1 tablet (30 mg total) by mouth every 4 (four) hours as needed (breakthrough pain). 60 tablet 0   Olopatadine HCl 0.2 % SOLN INSTILL 1 DROP INTO AFFECTED EYE(S) EVERY DAY 2.5 mL 1   ondansetron (ZOFRAN) 8 MG tablet Take 1 tablet (8 mg total) by mouth every 8 (eight) hours as needed for nausea or vomiting. 30 tablet 1   oxyCODONE ER (XTAMPZA ER) 18 MG C12A Take 18 mg by mouth every 8 (eight) hours. 90 capsule 0   predniSONE (DELTASONE) 50 MG tablet 1 tablet by mouth 13 hours ,7 hours and 1 hour  before CT scan 3 tablet 12   prochlorperazine (COMPAZINE) 10 MG tablet Take 1 tablet (10 mg total) by mouth every 6 (six) hours as needed. 30 tablet 2   triamcinolone (KENALOG) 0.025 % ointment APPLY TO AFFECTED AREA TWICE A DAY 30 g 0   No current facility-administered medications for this visit.    VITAL SIGNS: There were no vitals taken for this visit. There were no vitals filed for this visit.  Estimated body mass index is 25.28 kg/m as calculated from the following:   Height as of 07/29/22: 5' 2.75" (1.594 m).   Weight as of 07/29/22: 141 lb 9.6 oz (64.2 kg).   PERFORMANCE STATUS (ECOG) : 1 - Symptomatic but completely ambulatory   IMPRESSION:  I connected with Danielle [name] by phone. No acute distress identified. Is doing well overall. Trying to remain as active as possible. Denies nausea, vomiting, constipation, or diarrhea. No concerns with resting at night. Does get up to urinate however sleeps well overall. Appetite is good.   Neoplasm related pain Danielle [name] reports her pain is well controlled on current regimen. Tolerating without difficulty. No unwanted side effects.  We discussed her current regimen at length. Xtampza 18mg  every 8 hours. Is no longer requiring  breakthrough medication.  No changes to current regimen at this time.   We will continue to closely monitor and adjust regimen as needed. Pain contract on file.    Constipation  Improved with daily regimen.   I discussed the importance of continued conversation with family and their medical providers regarding overall plan of care and treatment options, ensuring decisions are within the context of the patients values and GOCs.  PLAN:   Xtampza 18mg  every 8 hours. Tolerating well.  Pain contract on file Can consider gabapentin or Cymbalta if pain becomes uncontrolled and regimen is maximized.  Miralax daily for bowel regimen.  I will plan to see patient back in 4-6 weeks in collaboration to other oncology appointments.    Patient expressed understanding and was in agreement with this plan. She also understands that She can call the clinic at any  time with any questions, concerns, or complaints.   Any controlled substances utilized were prescribed in the context of palliative care. PDMP has been reviewed.    Visit consisted of counseling and education dealing with the complex and emotionally intense issues of symptom management and palliative care in the setting of serious and potentially life-threatening illness.Greater than 50%  of this time was spent counseling and coordinating care related to the above assessment and plan.  Willette Alma, AGPCNP-BC  Palliative Medicine Team/[facility]  *Please note that this is a verbal dictation therefore any spelling or grammatical errors are due to the "Dragon Medical One" system interpretation.

## 2022-09-15 ENCOUNTER — Other Ambulatory Visit: Payer: Self-pay | Admitting: Family Medicine

## 2022-09-16 NOTE — Telephone Encounter (Signed)
Last office visit 09/14/21 for MWV.  Last refilled 06/21/2022 for #90 with no refills.  No future appointments.  Patient is now in palliative care.  We have tried to reach patient to schedule her annual physical w/o return call to schedule.  Okay to refill?

## 2022-09-19 ENCOUNTER — Inpatient Hospital Stay: Payer: 59 | Attending: Nurse Practitioner | Admitting: Nurse Practitioner

## 2022-09-19 ENCOUNTER — Encounter: Payer: Self-pay | Admitting: Nurse Practitioner

## 2022-09-19 DIAGNOSIS — G893 Neoplasm related pain (acute) (chronic): Secondary | ICD-10-CM

## 2022-09-19 DIAGNOSIS — C3490 Malignant neoplasm of unspecified part of unspecified bronchus or lung: Secondary | ICD-10-CM | POA: Diagnosis not present

## 2022-09-19 DIAGNOSIS — Z515 Encounter for palliative care: Secondary | ICD-10-CM

## 2022-09-19 DIAGNOSIS — C3410 Malignant neoplasm of upper lobe, unspecified bronchus or lung: Secondary | ICD-10-CM

## 2022-09-19 MED ORDER — XTAMPZA ER 18 MG PO C12A
18.0000 mg | EXTENDED_RELEASE_CAPSULE | Freq: Three times a day (TID) | ORAL | 0 refills | Status: DC
Start: 2022-09-22 — End: 2022-10-28

## 2022-09-20 ENCOUNTER — Other Ambulatory Visit: Payer: Self-pay | Admitting: Physician Assistant

## 2022-09-20 DIAGNOSIS — C3410 Malignant neoplasm of upper lobe, unspecified bronchus or lung: Secondary | ICD-10-CM

## 2022-09-27 ENCOUNTER — Other Ambulatory Visit: Payer: Self-pay | Admitting: Family Medicine

## 2022-09-27 NOTE — Telephone Encounter (Signed)
Ms. Legere has not seen Dr. Ermalene Searing in over a year.  We have tried reaching her to schedule an appointment with no avail.  Will forward refills to her oncologist to see if her is comfortable refilling her meds.

## 2022-09-30 ENCOUNTER — Encounter: Payer: Self-pay | Admitting: Internal Medicine

## 2022-10-01 NOTE — Telephone Encounter (Signed)
Oncologist denied refill request.  Will have front office attempt again to reach patient to schedule CPE with fasting labs prior with Dr. Ermalene Searing.

## 2022-10-01 NOTE — Telephone Encounter (Signed)
LVM for patient to c/b and sched.

## 2022-10-14 ENCOUNTER — Telehealth: Payer: Self-pay | Admitting: Medical Oncology

## 2022-10-14 NOTE — Telephone Encounter (Signed)
I told Danielle [name] that the prednisone refills were sent in March x 12 refills. I sent message to palliative care re next appt.

## 2022-10-15 ENCOUNTER — Ambulatory Visit (INDEPENDENT_AMBULATORY_CARE_PROVIDER_SITE_OTHER): Payer: 59 | Admitting: Family Medicine

## 2022-10-15 ENCOUNTER — Telehealth: Payer: Self-pay | Admitting: Nurse Practitioner

## 2022-10-15 VITALS — BP 110/58 | HR 97 | Temp 97.6°F | Ht 62.75 in | Wt 144.0 lb

## 2022-10-15 DIAGNOSIS — Z23 Encounter for immunization: Secondary | ICD-10-CM

## 2022-10-15 DIAGNOSIS — I152 Hypertension secondary to endocrine disorders: Secondary | ICD-10-CM | POA: Diagnosis not present

## 2022-10-15 DIAGNOSIS — E1159 Type 2 diabetes mellitus with other circulatory complications: Secondary | ICD-10-CM

## 2022-10-15 DIAGNOSIS — E1169 Type 2 diabetes mellitus with other specified complication: Secondary | ICD-10-CM | POA: Diagnosis not present

## 2022-10-15 DIAGNOSIS — C3411 Malignant neoplasm of upper lobe, right bronchus or lung: Secondary | ICD-10-CM | POA: Diagnosis not present

## 2022-10-15 DIAGNOSIS — E785 Hyperlipidemia, unspecified: Secondary | ICD-10-CM

## 2022-10-15 DIAGNOSIS — E119 Type 2 diabetes mellitus without complications: Secondary | ICD-10-CM

## 2022-10-15 LAB — POCT GLYCOSYLATED HEMOGLOBIN (HGB A1C): Hemoglobin A1C: 5.5 % (ref 4.0–5.6)

## 2022-10-15 LAB — HM DIABETES FOOT EXAM

## 2022-10-15 NOTE — Progress Notes (Signed)
Patient ID: JETZABEL LASSERE, female    DOB: 09/02/1950, 72 y.o.   MRN: [id number]  This visit was conducted in person.  BP (!) 110/58   Pulse 97   Temp 97.6 F (36.4 C) (Oral)   Ht 5' 2.75" (1.594 m)   Wt 144 lb (65.3 kg)   SpO2 94%   BMI 25.71 kg/m    CC:  Chief Complaint  Patient presents with   Medical Management of Chronic Issues    Needs refill on medications     Subjective:   HPI: LEVINIA HARRELL is a 72 y.o. female presenting on 10/15/2022 for Medical Management of Chronic Issues (Needs refill on medications )   Diabetes:  Diet controlled. Lab Results  Component Value Date   HGBA1C 5.5 10/15/2022  Due for re-eval. Using medications without difficulties: Hypoglycemic episodes: Hyperglycemic episodes: Feet problems: none.. no issues refused foot exam. Blood Sugars averaging: not checking eye exam within last year: yes  Not able to provide urine sample today.  Hypertension:    Good  control in office today on amlodipine 10 mg p.o. daily, lisinopril 40 mg p.o. daily, Lasix 20 mg p.o. daily as needed for swelling BP Readings from Last 3 Encounters:  10/15/22 (!) 110/58  07/29/22 (!) 136/52  07/02/22 132/61  Using medication without problems or lightheadedness:  occ Chest pain with exertion: none Edema: stable Short of breath: stable Average home BPs: at home  none Other issues: Wt Readings from Last 3 Encounters:  10/15/22 144 lb (65.3 kg)  07/29/22 141 lb 9.6 oz (64.2 kg)  07/02/22 145 lb 14.4 oz (66.2 kg)     Elevated Cholesterol:  Due for re-eval. LDL at goal less than 70 on atorvastatin 40 mg p.o. daily Lab Results  Component Value Date   CHOL 114 09/04/2020   HDL 38.00 (L) 09/04/2020   LDLCALC 54 09/04/2020   LDLDIRECT 105.5 08/06/2013   TRIG 112.0 09/04/2020   CHOLHDL 3 09/04/2020  Using medications without problems: Muscle aches:  Diet compliance: moderate Exercise: minimal Other complaints:   Lung cancer, active, mets to brain:  currently on maintenance treatment with single agent Alimta status post 233 cycles.  Now on observation > 18 months.  New lesion in left lung.. s/p radiation x 3 . Last OV Dr. Tanja Port 07/2022  Reviewed OV from palliative care for pain managment  On Xtampza 18mg  every 8 hours for pain. Tolerating well.        Relevant past medical, surgical, family and social history reviewed and updated as indicated. Interim medical history since our last visit reviewed. Allergies and medications reviewed and updated. Outpatient Medications Prior to Visit  Medication Sig Dispense Refill   albuterol (VENTOLIN HFA) 108 (90 Base) MCG/ACT inhaler TAKE 2 PUFFS BY MOUTH EVERY 6 HOURS AS NEEDED FOR WHEEZE OR SHORTNESS OF BREATH 8.5 each 5   alendronate (FOSAMAX) 70 MG tablet TAKE 1 TABLET BY MOUTH EVERY 7 (SEVEN) DAYS. TAKE WITH A FULL GLASS OF WATER ON AN EMPTY STOMACH. 12 tablet 1   amLODipine (NORVASC) 10 MG tablet TAKE 1 TABLET BY MOUTH EVERY DAY 90 tablet 0   aspirin 325 MG EC tablet TAKE 1 TABLET BY MOUTH EVERY DAY 90 tablet 0   atorvastatin (LIPITOR) 40 MG tablet TAKE 1 TABLET BY MOUTH EVERY DAY AT 6 PM 90 tablet 0   Cholecalciferol (VITAMIN D3) 50 MCG (2000 UT) TABS Take by mouth.     diphenhydrAMINE (BENADRYL) 25 MG tablet Take 1  tablet (25 mg total) by mouth as directed. 2 hours before CT scan. 5 tablet 0   FeFum-FePoly-FA-B Cmp-C-Biot (FOLIVANE-PLUS) CAPS TAKE 1 CAPSULE BY MOUTH EVERY DAY IN THE MORNING 90 capsule 3   fluticasone (FLONASE) 50 MCG/ACT nasal spray Place 1-2 sprays into both nostrils daily as needed. 16 g 5   furosemide (LASIX) 20 MG tablet TAKE 1 TABLET BY MOUTH EVERY DAY AS NEEDED FOR SWELLING 20 tablet 1   INCRUSE ELLIPTA 62.5 MCG/ACT AEPB INHALE 1 PUFF BY MOUTH EVERY DAY 30 each 5   lidocaine-prilocaine (EMLA) cream APPLY TO AFFECTED AREA FOR 1 DOSE 30 g 2   lidocaine-prilocaine (EMLA) cream Apply 1 Application topically once.     lisinopril (ZESTRIL) 20 MG tablet TAKE 2 TABLETS (40 MG  TOTAL) BY MOUTH DAILY. 180 tablet 1   Olopatadine HCl 0.2 % SOLN INSTILL 1 DROP INTO AFFECTED EYE(S) EVERY DAY 2.5 mL 1   ondansetron (ZOFRAN) 8 MG tablet Take 1 tablet (8 mg total) by mouth every 8 (eight) hours as needed for nausea or vomiting. 30 tablet 1   oxyCODONE ER (XTAMPZA ER) 18 MG C12A Take 18 mg by mouth every 8 (eight) hours. 90 capsule 0   predniSONE (DELTASONE) 50 MG tablet 1 tablet by mouth 13 hours ,7 hours and 1 hour  before CT scan 3 tablet 12   prochlorperazine (COMPAZINE) 10 MG tablet Take 1 tablet (10 mg total) by mouth every 6 (six) hours as needed. 30 tablet 2   triamcinolone (KENALOG) 0.025 % ointment APPLY TO AFFECTED AREA TWICE A DAY 30 g 0   No facility-administered medications prior to visit.     Per HPI unless specifically indicated in ROS section below Review of Systems  Constitutional:  Negative for fatigue and fever.  HENT:  Negative for congestion.   Eyes:  Negative for pain.  Respiratory:  Negative for cough and shortness of breath.   Cardiovascular:  Negative for chest pain, palpitations and leg swelling.  Gastrointestinal:  Negative for abdominal pain.  Genitourinary:  Negative for dysuria and vaginal bleeding.  Musculoskeletal:  Negative for back pain.  Neurological:  Negative for syncope, light-headedness and headaches.  Psychiatric/Behavioral:  Negative for dysphoric mood.    Objective:  BP (!) 110/58   Pulse 97   Temp 97.6 F (36.4 C) (Oral)   Ht 5' 2.75" (1.594 m)   Wt 144 lb (65.3 kg)   SpO2 94%   BMI 25.71 kg/m   Wt Readings from Last 3 Encounters:  10/15/22 144 lb (65.3 kg)  07/29/22 141 lb 9.6 oz (64.2 kg)  07/02/22 145 lb 14.4 oz (66.2 kg)      Physical Exam Constitutional:      General: She is not in acute distress.    Appearance: Normal appearance. She is well-developed. She is not ill-appearing or toxic-appearing.  HENT:     Head: Normocephalic.     Right Ear: Hearing, tympanic membrane, ear canal and external ear normal.  Tympanic membrane is not erythematous, retracted or bulging.     Left Ear: Hearing, tympanic membrane, ear canal and external ear normal. Tympanic membrane is not erythematous, retracted or bulging.     Nose: No mucosal edema or rhinorrhea.     Right Sinus: No maxillary sinus tenderness or frontal sinus tenderness.     Left Sinus: No maxillary sinus tenderness or frontal sinus tenderness.     Mouth/Throat:     Pharynx: Uvula midline.  Eyes:     General: Lids  are normal. Lids are everted, no foreign bodies appreciated.     Conjunctiva/sclera: Conjunctivae normal.     Pupils: Pupils are equal, round, and reactive to light.  Neck:     Thyroid: No thyroid mass or thyromegaly.     Vascular: No carotid bruit.     Trachea: Trachea normal.  Cardiovascular:     Rate and Rhythm: Normal rate and regular rhythm.     Pulses: Normal pulses.     Heart sounds: Normal heart sounds, S1 normal and S2 normal. No murmur heard.    No friction rub. No gallop.  Pulmonary:     Effort: Pulmonary effort is normal. No tachypnea or respiratory distress.     Breath sounds: Rhonchi present. No decreased breath sounds, wheezing or rales.  Abdominal:     General: Bowel sounds are normal.     Palpations: Abdomen is soft.     Tenderness: There is no abdominal tenderness.  Musculoskeletal:     Cervical back: Normal range of motion and neck supple.  Skin:    General: Skin is warm and dry.     Findings: No rash.  Neurological:     Mental Status: She is alert.  Psychiatric:        Mood and Affect: Mood is not anxious or depressed.        Speech: Speech normal.        Behavior: Behavior normal. Behavior is cooperative.        Thought Content: Thought content normal.        Judgment: Judgment normal.       Diabetic foot exam: Patient refused Results for orders placed or performed in visit on 10/15/22  POCT glycosylated hemoglobin (Hb A1C)  Result Value Ref Range   Hemoglobin A1C 5.5 4.0 - 5.6 %   HbA1c POC  (<> result, manual entry)     HbA1c, POC (prediabetic range)     HbA1c, POC (controlled diabetic range)     *Note: Due to a large number of results and/or encounters for the requested time period, some results have not been displayed. A complete set of results can be found in Results Review.     COVID 19 screen:  No recent travel or known exposure to COVID19 The patient denies respiratory symptoms of COVID 19 at this time. The importance of social distancing was discussed today.   Assessment and Plan The patient's preventative maintenance and recommended screening tests for an annual wellness exam were reviewed in full today. Brought up to date unless services declined.  Counselled on the importance of diet, exercise, and its role in overall health and mortality. The patient's FH and SH was reviewed, including their home life, tobacco status, and drug and alcohol status.   Given flu shot today Hep C screening - completed HIV screening - completed A1C - completed, not able to give urine sample for microalbumin today, refused foot exam Mammogram/ breast exam - declined PAP smear - declined Shingles - contraindicated Eye exam - pt reported exam .. 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  Not interested in Medicare wellness at this time.      Diabetes mellitus with no complication (HCC) (Chronic)    Chronic, A1c in office today 5.5 in normal range on diet.      Hyperlipidemia associated with type 2 diabetes  mellitus (HCC) (Chronic)    She is not interested in full panel including lipids as she has complete metabolic panel and CBC at oncologist office. She wishes to continue atorvastatin at current dose.  Atorvastatin 40 mg p.o. daily      Relevant Orders   POCT glycosylated hemoglobin (Hb A1C) (Completed)   Hypertension associated with  diabetes (HCC) (Chronic)    Stable, chronic.  Continue current medication.  Occasional lightheadedness, if this continues she will check blood pressure when symptomatic.  If BP is dropping low will consider reducing medication.   amlodipine 10 mg p.o. daily  lisinopril 20 mg p.o. daily  Lasix 20 mg p.o. daily as needed for swelling      Other Visit Diagnoses     Encounter for immunization       Relevant Orders   Flu Vaccine Trivalent High Dose (Fluad) (Completed)        [name], MD

## 2022-10-15 NOTE — Assessment & Plan Note (Signed)
Currently on palliative care.  Not interested in Medicare wellness at this time.

## 2022-10-15 NOTE — Assessment & Plan Note (Signed)
She is not interested in full panel including lipids as she has complete metabolic panel and CBC at oncologist office. She wishes to continue atorvastatin at current dose.  Atorvastatin 40 mg p.o. daily

## 2022-10-15 NOTE — Assessment & Plan Note (Signed)
Chronic, A1c in office today 5.5 in normal range on diet.

## 2022-10-15 NOTE — Patient Instructions (Signed)
Check BP if you are feeling lightheaded for consideration of  BP dropping low causing symptoms.

## 2022-10-15 NOTE — Assessment & Plan Note (Signed)
Stable, chronic.  Continue current medication.  Occasional lightheadedness, if this continues she will check blood pressure when symptomatic.  If BP is dropping low will consider reducing medication.   amlodipine 10 mg p.o. daily  lisinopril 20 mg p.o. daily  Lasix 20 mg p.o. daily as needed for swelling

## 2022-10-21 ENCOUNTER — Ambulatory Visit (HOSPITAL_COMMUNITY)
Admission: RE | Admit: 2022-10-21 | Discharge: 2022-10-21 | Disposition: A | Discharge status: Home / Self Care | Payer: 59 | Source: Ambulatory Visit | Attending: Internal Medicine | Admitting: Internal Medicine

## 2022-10-21 ENCOUNTER — Inpatient Hospital Stay: Payer: 59

## 2022-10-21 ENCOUNTER — Encounter (HOSPITAL_COMMUNITY): Payer: Self-pay

## 2022-10-21 ENCOUNTER — Other Ambulatory Visit: Payer: Self-pay | Admitting: Internal Medicine

## 2022-10-21 ENCOUNTER — Inpatient Hospital Stay: Payer: 59 | Attending: Nurse Practitioner

## 2022-10-21 DIAGNOSIS — C3490 Malignant neoplasm of unspecified part of unspecified bronchus or lung: Secondary | ICD-10-CM | POA: Insufficient documentation

## 2022-10-21 DIAGNOSIS — C7931 Secondary malignant neoplasm of brain: Secondary | ICD-10-CM | POA: Insufficient documentation

## 2022-10-21 DIAGNOSIS — Z87891 Personal history of nicotine dependence: Secondary | ICD-10-CM | POA: Insufficient documentation

## 2022-10-21 DIAGNOSIS — M810 Age-related osteoporosis without current pathological fracture: Secondary | ICD-10-CM | POA: Insufficient documentation

## 2022-10-21 DIAGNOSIS — Z9221 Personal history of antineoplastic chemotherapy: Secondary | ICD-10-CM | POA: Insufficient documentation

## 2022-10-21 DIAGNOSIS — G893 Neoplasm related pain (acute) (chronic): Secondary | ICD-10-CM | POA: Insufficient documentation

## 2022-10-21 DIAGNOSIS — Z923 Personal history of irradiation: Secondary | ICD-10-CM | POA: Insufficient documentation

## 2022-10-21 DIAGNOSIS — Z79899 Other long term (current) drug therapy: Secondary | ICD-10-CM | POA: Insufficient documentation

## 2022-10-21 DIAGNOSIS — Z7982 Long term (current) use of aspirin: Secondary | ICD-10-CM | POA: Insufficient documentation

## 2022-10-21 DIAGNOSIS — K5900 Constipation, unspecified: Secondary | ICD-10-CM | POA: Diagnosis not present

## 2022-10-21 DIAGNOSIS — C7951 Secondary malignant neoplasm of bone: Secondary | ICD-10-CM | POA: Insufficient documentation

## 2022-10-21 DIAGNOSIS — J929 Pleural plaque without asbestos: Secondary | ICD-10-CM | POA: Diagnosis not present

## 2022-10-21 LAB — CMP (CANCER CENTER ONLY)
ALT: 8 U/L (ref 0–44)
AST: 13 U/L — ABNORMAL LOW (ref 15–41)
Albumin: 3.9 g/dL (ref 3.5–5.0)
Alkaline Phosphatase: 74 U/L (ref 38–126)
Anion gap: 7 (ref 5–15)
BUN: 19 mg/dL (ref 8–23)
CO2: 26 mmol/L (ref 22–32)
Calcium: 9.7 mg/dL (ref 8.9–10.3)
Chloride: 105 mmol/L (ref 98–111)
Creatinine: 1.2 mg/dL — ABNORMAL HIGH (ref 0.44–1.00)
GFR, Estimated: 49 mL/min — ABNORMAL LOW (ref >60)
Glucose, Bld: 131 mg/dL — ABNORMAL HIGH (ref 70–99)
Potassium: 4.2 mmol/L (ref 3.5–5.1)
Sodium: 138 mmol/L (ref 135–145)
Total Bilirubin: 0.5 mg/dL (ref 0.3–1.2)
Total Protein: 7 g/dL (ref 6.5–8.1)

## 2022-10-21 LAB — CBC WITH DIFFERENTIAL (CANCER CENTER ONLY)
Abs Immature Granulocytes: 0.05 10*3/uL (ref 0.00–0.07)
Basophils Absolute: 0.1 10*3/uL (ref 0.0–0.1)
Basophils Relative: 1 %
Eosinophils Absolute: 0.1 10*3/uL (ref 0.0–0.5)
Eosinophils Relative: 1 %
HCT: 39.7 % (ref 36.0–46.0)
Hemoglobin: 12.5 g/dL (ref 12.0–15.0)
Immature Granulocytes: 0 %
Lymphocytes Relative: 26 %
Lymphs Abs: 3.2 10*3/uL (ref 0.7–4.0)
MCH: 29.2 pg (ref 26.0–34.0)
MCHC: 31.5 g/dL (ref 30.0–36.0)
MCV: 92.8 fL (ref 80.0–100.0)
Monocytes Absolute: 1.1 10*3/uL — ABNORMAL HIGH (ref 0.1–1.0)
Monocytes Relative: 9 %
Neutro Abs: 8.1 10*3/uL — ABNORMAL HIGH (ref 1.7–7.7)
Neutrophils Relative %: 63 %
Platelet Count: 413 10*3/uL — ABNORMAL HIGH (ref 150–400)
RBC: 4.28 MIL/uL (ref 3.87–5.11)
RDW: 13.5 % (ref 11.5–15.5)
WBC Count: 12.7 10*3/uL — ABNORMAL HIGH (ref 4.0–10.5)
nRBC: 0 % (ref 0.0–0.2)

## 2022-10-21 MED ORDER — IOHEXOL 300 MG/ML  SOLN
100.0000 mL | Freq: Once | INTRAMUSCULAR | Status: AC | PRN
  Administered 2022-10-21: 75 mL via INTRAVENOUS

## 2022-10-21 MED ORDER — HEPARIN SOD (PORK) LOCK FLUSH 100 UNIT/ML IV SOLN
INTRAVENOUS | Status: AC
  Filled 2022-10-21: qty 5

## 2022-10-21 MED ORDER — HEPARIN SOD (PORK) LOCK FLUSH 100 UNIT/ML IV SOLN
500.0000 [IU] | Freq: Once | INTRAVENOUS | Status: AC
  Administered 2022-10-21: 500 [IU] via INTRAVENOUS

## 2022-10-23 ENCOUNTER — Other Ambulatory Visit: Payer: 59

## 2022-10-27 NOTE — Progress Notes (Unsigned)
[facility]  [phone] [phone]   Name: Danielle [name] Date: 10/27/2022 MRN: [id number]  DOB: 07-08-1950  Patient Care Team: Excell Seltzer, MD as PCP - General Si Gaul, MD as Consulting Physician (Oncology) Vilinda Flake, [facility] (Inactive) as Pharmacist (Pharmacist) Pickenpack-Cousar, Arty Baumgartner, NP as Nurse Practitioner (Nurse Practitioner)   INTERVAL HISTORY: Danielle [name] is a 72 y.o. 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 Past medical history also includes COPD, diabetes mellitus, insomnia, anemia, as well as a cerebral infarct(10/2014). Palliative ask to see for symptom and pain management and goals of care.   SOCIAL HISTORY:     reports that she quit smoking about 17 years ago. Her smoking use included cigarettes. She started smoking about 47 years ago. She has a 60 pack-year smoking history. She has never used smokeless tobacco. She reports that she does not drink alcohol and does not use drugs.  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 had chemo and radiation for lung ca   Thrush 12/11/2010    ALLERGIES:  is allergic to contrast media [iodinated contrast media], iohexol, sulfa drugs cross reactors, and co-trimoxazole  injection [sulfamethoxazole-trimethoprim].  MEDICATIONS:  Current Outpatient Medications  Medication Sig Dispense Refill   albuterol (VENTOLIN HFA) 108 (90 Base) MCG/ACT inhaler TAKE 2 PUFFS BY MOUTH EVERY 6 HOURS AS NEEDED FOR WHEEZE OR SHORTNESS OF BREATH 8.5 each 5   alendronate (FOSAMAX) 70 MG tablet TAKE 1 TABLET BY MOUTH EVERY 7 (SEVEN) DAYS. TAKE WITH A FULL GLASS OF WATER ON AN EMPTY STOMACH. 12 tablet 1   amLODipine (NORVASC) 10 MG tablet TAKE 1 TABLET BY MOUTH EVERY DAY 90 tablet 0   aspirin 325 MG EC tablet TAKE 1 TABLET BY MOUTH EVERY DAY 90 tablet 0   atorvastatin (LIPITOR) 40 MG tablet TAKE 1 TABLET BY MOUTH EVERY DAY AT 6 PM 90 tablet 0   Cholecalciferol (VITAMIN D3) 50 MCG (2000 UT) TABS Take by mouth.     diphenhydrAMINE (BENADRYL) 25 MG tablet Take 1 tablet (25 mg total) by mouth as directed. 2 hours before CT scan. 5 tablet 0   FeFum-FePoly-FA-B Cmp-C-Biot (FOLIVANE-PLUS) CAPS TAKE 1 CAPSULE BY MOUTH EVERY DAY IN THE MORNING 90 capsule 3   fluticasone (FLONASE) 50 MCG/ACT nasal spray Place 1-2 sprays into both nostrils daily as needed. 16 g 5   furosemide (LASIX) 20 MG tablet TAKE 1 TABLET BY MOUTH EVERY DAY AS NEEDED FOR SWELLING 20 tablet 1   INCRUSE ELLIPTA 62.5 MCG/ACT AEPB INHALE 1 PUFF BY MOUTH EVERY DAY 30 each 5   lidocaine-prilocaine (EMLA) cream APPLY TO AFFECTED AREA FOR 1 DOSE 30 g 2   lidocaine-prilocaine (EMLA) cream Apply 1 Application topically  once.     lisinopril (ZESTRIL) 20 MG tablet TAKE 2 TABLETS (40 MG TOTAL) BY MOUTH DAILY. 180 tablet 1   Olopatadine HCl 0.2 % SOLN INSTILL 1 DROP INTO AFFECTED EYE(S) EVERY DAY 2.5 mL 1   ondansetron (ZOFRAN) 8 MG tablet Take 1 tablet (8 mg total) by mouth every 8 (eight) hours as needed for nausea or vomiting. 30 tablet 1   oxyCODONE ER (XTAMPZA ER) 18 MG C12A Take 18 mg by mouth every 8 (eight) hours. 90 capsule 0   predniSONE (DELTASONE) 50 MG tablet 1 tablet by mouth 13 hours ,7 hours and 1 hour  before CT scan 3  tablet 12   prochlorperazine (COMPAZINE) 10 MG tablet Take 1 tablet (10 mg total) by mouth every 6 (six) hours as needed. 30 tablet 2   triamcinolone (KENALOG) 0.025 % ointment APPLY TO AFFECTED AREA TWICE A DAY 30 g 0   No current facility-administered medications for this visit.    VITAL SIGNS: There were no vitals taken for this visit. There were no vitals filed for this visit.  Estimated body mass index is 25.71 kg/m as calculated from the following:   Height as of 10/15/22: 5' 2.75" (1.594 m).   Weight as of 10/15/22: 144 lb (65.3 kg).   PERFORMANCE STATUS (ECOG) : 1 - Symptomatic but completely ambulatory  Assessment NAD RRR Normal breathing pattern Alert and oriented x3  IMPRESSION:  Danielle [name] presents to clinic for follow-up.  They deny any issues with constipation or diarrhea, and have not required anti-nausea medication. Sleep is occasionally interrupted due to nocturia. Appetite and weight have remained stable.  They have two sons who are firemen and are available for emergency assistance if needed. Appreciative of her family's support. Taking things one day at a time. The patient is scheduled to see Dr. Gwenyth Bouillon later this week.  Neoplasm related pain Ms. Grassel reports that her pain level has been consistent, or "the norm." Pain is well controlled on current regimen. Tolerating without difficulty. No unwanted side effects.  The patient is adherent to their Xtampza regimen, taking it three times daily at specific times, although they occasionally miss the 10 PM dose if she is asleep. They also take morphine, but only when she anticipates increased activity and walking, such as during church events or outings. The patient reports that increased walking exacerbates their back pain, and they have started using mobility aids in stores to manage this.  We discussed her current regimen at length. Xtampza 18mg  every 8 hours. Morphine IR as needed. Not requiring daily.  No  changes to current regimen at this time.   We will continue to closely monitor and adjust regimen as needed. Pain contract on file.    Constipation  Controlled  I discussed the importance of continued conversation with family and their medical providers regarding overall plan of care and treatment options, ensuring decisions are within the context of the patients values and GOCs.  PLAN:  Chronic Pain Stable on Xtampza. No issues with constipation or diarrhea. No need for antiemetics at this time. -Continue Xtampza as prescribed. -Refill prescription sent to [facility] in [place].  Intermittent Increased Pain with Activity Reports increased back pain with increased walking. Uses Morphine sparingly for these instances. -Refill Morphine IR prescription sent to [facility] in [facility] for use as needed.  Follow-up Plan to coordinate next visit with Dr. Asa Lente schedule. Patient to be seen in 2-4 weeks.  Patient expressed understanding and was in agreement with this  plan. She also understands that She can call the clinic at any time with any questions, concerns, or complaints.   Any controlled substances utilized were prescribed in the context of palliative care. PDMP has been reviewed.    Visit consisted of counseling and education dealing with the complex and emotionally intense issues of symptom management and palliative care in the setting of serious and potentially life-threatening illness.  Willette Alma, AGPCNP-BC  Palliative Medicine Team/[facility]  *Please note that this is a verbal dictation therefore any spelling or grammatical errors are due to the "[employer] One" system interpretation.

## 2022-10-28 ENCOUNTER — Inpatient Hospital Stay (HOSPITAL_BASED_OUTPATIENT_CLINIC_OR_DEPARTMENT_OTHER): Payer: 59 | Admitting: Nurse Practitioner

## 2022-10-28 ENCOUNTER — Encounter: Payer: Self-pay | Admitting: Nurse Practitioner

## 2022-10-28 VITALS — BP 119/52 | HR 75 | Temp 98.0°F | Resp 16 | Ht 62.0 in | Wt 143.1 lb

## 2022-10-28 DIAGNOSIS — R530 Neoplastic (malignant) related fatigue: Secondary | ICD-10-CM | POA: Diagnosis not present

## 2022-10-28 DIAGNOSIS — G893 Neoplasm related pain (acute) (chronic): Secondary | ICD-10-CM

## 2022-10-28 DIAGNOSIS — Z515 Encounter for palliative care: Secondary | ICD-10-CM

## 2022-10-28 DIAGNOSIS — C3410 Malignant neoplasm of upper lobe, unspecified bronchus or lung: Secondary | ICD-10-CM | POA: Diagnosis not present

## 2022-10-28 DIAGNOSIS — C3490 Malignant neoplasm of unspecified part of unspecified bronchus or lung: Secondary | ICD-10-CM

## 2022-10-28 MED ORDER — XTAMPZA ER 18 MG PO C12A
18.0000 mg | EXTENDED_RELEASE_CAPSULE | Freq: Three times a day (TID) | ORAL | 0 refills | Status: DC
Start: 2022-10-28 — End: 2022-11-27

## 2022-10-28 MED ORDER — MORPHINE SULFATE 30 MG PO TABS: 30.0000 mg | ORAL_TABLET | ORAL | 0 refills | Status: DC | PRN

## 2022-10-29 ENCOUNTER — Encounter: Payer: Self-pay | Admitting: Internal Medicine

## 2022-10-30 ENCOUNTER — Other Ambulatory Visit: Payer: Self-pay

## 2022-10-30 ENCOUNTER — Inpatient Hospital Stay (HOSPITAL_BASED_OUTPATIENT_CLINIC_OR_DEPARTMENT_OTHER): Payer: 59 | Admitting: Internal Medicine

## 2022-10-30 VITALS — BP 154/84 | HR 85 | Temp 97.4°F | Resp 16 | Ht 62.0 in | Wt 140.0 lb

## 2022-10-30 DIAGNOSIS — K5900 Constipation, unspecified: Secondary | ICD-10-CM | POA: Diagnosis not present

## 2022-10-30 DIAGNOSIS — C3490 Malignant neoplasm of unspecified part of unspecified bronchus or lung: Secondary | ICD-10-CM

## 2022-10-30 DIAGNOSIS — G893 Neoplasm related pain (acute) (chronic): Secondary | ICD-10-CM | POA: Diagnosis not present

## 2022-10-30 DIAGNOSIS — C7931 Secondary malignant neoplasm of brain: Secondary | ICD-10-CM | POA: Diagnosis not present

## 2022-10-30 DIAGNOSIS — Z9221 Personal history of antineoplastic chemotherapy: Secondary | ICD-10-CM | POA: Diagnosis not present

## 2022-10-30 DIAGNOSIS — Z79899 Other long term (current) drug therapy: Secondary | ICD-10-CM | POA: Diagnosis not present

## 2022-10-30 DIAGNOSIS — M810 Age-related osteoporosis without current pathological fracture: Secondary | ICD-10-CM | POA: Diagnosis not present

## 2022-10-30 DIAGNOSIS — C7951 Secondary malignant neoplasm of bone: Secondary | ICD-10-CM | POA: Diagnosis not present

## 2022-10-30 DIAGNOSIS — Z7982 Long term (current) use of aspirin: Secondary | ICD-10-CM | POA: Diagnosis not present

## 2022-10-30 DIAGNOSIS — Z87891 Personal history of nicotine dependence: Secondary | ICD-10-CM | POA: Diagnosis not present

## 2022-10-30 DIAGNOSIS — Z923 Personal history of irradiation: Secondary | ICD-10-CM | POA: Diagnosis not present

## 2022-10-30 NOTE — Progress Notes (Signed)
[facility] [phone]) [phone]   [phone]  OFFICE PROGRESS NOTE  Excell Seltzer, MD [address] [place] [address]  DIAGNOSIS: Metastatic non-small cell lung cancer initially diagnosed as locally advanced stage IIIB with a right Pancoast tumor involving the vertebral body as well as prominent canal invasion with spinal cord compression in October 2007. The patient also has metastatic disease to the brain in April 2008.  PRIOR THERAPY: Status post concurrent chemoradiation with weekly carboplatin and paclitaxel, last dose was given November 18, 2005. 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 Status post gamma knife radiotherapy to a solitary brain lesion located in the superior frontal area of the brain at [facility] in April of 2008. Status post palliative radiotherapy to the lateral abdominal wall metastatic lesion under the care of Dr. Mitzi Hansen, completed March of 2009. Status post 6 cycles of systemic chemotherapy with carboplatin and Alimta. Last dose was given July 26, 2007 with disease stabilization. Gamma knife stereotactic radiotherapy to 2 brain lesions one involving the right frontal dural based as well as right parietal lesion performed on 05/07/2012 under the care of Dr. Johny Drilling at [facility]. Maintenance systemic chemotherapy with Alimta 500 MG/M2 every 3 weeks, status post 233 cycles.  Treatment is currently on hold since January 30, 2021.  SBRT to enlarging left upper lobe lung nodule under the care of Dr. Mitzi Hansen completed on July 27, 2021.  CURRENT THERAPY: Observation.  INTERVAL HISTORY: Danielle [name] 72 y.o. female returns to the clinic today for follow-up visit accompanied by her daughter Danielle [name]. Discussed the use of AI scribe software for clinical note transcription with the patient, who gave verbal consent to proceed.  History of  Present Illness   Danielle [name], a 72 year old patient with a history of metastatic non-small cell lung cancer diagnosed in October 2007, presents for a routine follow-up. The patient has undergone extensive treatment, including radiation to the spine, neck, and multiple brain locations, and has received 233 cycles of maintenance Alimta, with the last dose administered in January 2023. Since then, the patient has been under observation.  The patient reports feeling tired, a symptom that is persistent rather than episodic. There have been no recent episodes of nausea, vomiting, or diarrhea. However, the patient has been experiencing frequent hot flashes. There is no reported fever. The patient denies any recent weight loss and continues to fit into the same clothing.  The patient also reports shortness of breath when engaging in physical activities such as walking. Despite this, the patient's condition appears to be stable, with no new symptoms or significant changes in health status.  The patient has been prescribed Fosamax, presumably for osteoporosis, but is not currently taking it. The patient continues to receive pain management from a pain clinic, with a regimen that includes MS Contin and morphine sulfate.       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 had chemo and radiation for lung ca   Thrush 12/11/2010    ALLERGIES:  is allergic to contrast media [iodinated contrast  media], iohexol, sulfa drugs cross reactors, and co-trimoxazole injection [sulfamethoxazole-trimethoprim].  MEDICATIONS:  Current Outpatient Medications  Medication Sig Dispense Refill   albuterol (VENTOLIN HFA) 108 (90 Base) MCG/ACT inhaler TAKE 2 PUFFS BY MOUTH EVERY 6 HOURS AS NEEDED FOR WHEEZE OR SHORTNESS OF BREATH 8.5 each 5   alendronate (FOSAMAX) 70 MG tablet TAKE 1 TABLET BY MOUTH EVERY 7 (SEVEN) DAYS. TAKE WITH A FULL GLASS OF WATER ON AN EMPTY STOMACH. 12 tablet 1   amLODipine (NORVASC) 10 MG tablet TAKE 1 TABLET BY MOUTH EVERY DAY 90 tablet 0   aspirin 325 MG EC tablet TAKE 1 TABLET BY MOUTH EVERY DAY 90 tablet 0   atorvastatin (LIPITOR) 40 MG tablet TAKE 1 TABLET BY MOUTH EVERY DAY AT 6 PM 90 tablet 0   Cholecalciferol (VITAMIN D3) 50 MCG (2000 UT) TABS Take by mouth.     diphenhydrAMINE (BENADRYL) 25 MG tablet Take 1 tablet (25 mg total) by mouth as directed. 2 hours before CT scan. 5 tablet 0   FeFum-FePoly-FA-B Cmp-C-Biot (FOLIVANE-PLUS) CAPS TAKE 1 CAPSULE BY MOUTH EVERY DAY IN THE MORNING 90 capsule 3   fluticasone (FLONASE) 50 MCG/ACT nasal spray Place 1-2 sprays into both nostrils daily as needed. 16 g 5   furosemide (LASIX) 20 MG tablet TAKE 1 TABLET BY MOUTH EVERY DAY AS NEEDED FOR SWELLING 20 tablet 1   INCRUSE ELLIPTA 62.5 MCG/ACT AEPB INHALE 1 PUFF BY MOUTH EVERY DAY 30 each 5   lidocaine-prilocaine (EMLA) cream APPLY TO AFFECTED AREA FOR 1 DOSE 30 g 2   lidocaine-prilocaine (EMLA) cream Apply 1 Application topically once.     lisinopril (ZESTRIL) 20 MG tablet TAKE 2 TABLETS (40 MG TOTAL) BY MOUTH DAILY. 180 tablet 1   morphine (MSIR) 30 MG tablet Take 1 tablet (30 mg total) by mouth every 4 (four) hours as needed for severe pain (pain score 7-10). 60 tablet 0   Olopatadine HCl 0.2 % SOLN INSTILL 1 DROP INTO AFFECTED EYE(S) EVERY DAY 2.5 mL 1   ondansetron (ZOFRAN) 8 MG tablet Take 1 tablet (8 mg total) by mouth every 8 (eight) hours as needed for nausea or vomiting. 30  tablet 1   oxyCODONE ER (XTAMPZA ER) 18 MG C12A Take 18 mg by mouth every 8 (eight) hours. 90 capsule 0   predniSONE (DELTASONE) 50 MG tablet 1 tablet by mouth 13 hours ,7 hours and 1 hour  before CT scan 3 tablet 12   prochlorperazine (COMPAZINE) 10 MG tablet Take 1 tablet (10 mg total) by mouth every 6 (six) hours as needed. 30 tablet 2   triamcinolone (KENALOG) 0.025 % ointment APPLY TO AFFECTED AREA TWICE A DAY 30 g 0   No current facility-administered medications for this visit.    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   LOOP RECORDER IMPLANT N/A 04/19/2014   Procedure: LOOP RECORDER IMPLANT;  Surgeon: Hillis Range, MD;  Location: [facility] CATH LAB;  Service: Cardiovascular;  Laterality: N/A;   OTHER SURGICAL HISTORY  2008   Gamma knife surgery to remove  brain met    PORTACATH PLACEMENT  -[name]   TIP IN CAVOATRIAL JUNCTION   TEE WITHOUT CARDIOVERSION N/A 04/19/2014   Procedure: TRANSESOPHAGEAL ECHOCARDIOGRAM (TEE);  Surgeon: Laurey Morale, MD;  Location: [facility] ENDOSCOPY;  Service: Cardiovascular;  Laterality: N/A;    REVIEW OF SYSTEMS:  Constitutional: positive for fatigue Eyes: negative Ears, nose, mouth, throat, and face: negative Respiratory: positive for dyspnea on exertion Cardiovascular: negative Gastrointestinal: negative Genitourinary:negative Integument/breast: negative Hematologic/lymphatic: negative Musculoskeletal:negative Neurological: negative Behavioral/Psych: negative Endocrine: negative Allergic/Immunologic: negative   PHYSICAL EXAMINATION: General appearance: alert, cooperative, and no distress Head: Normocephalic, without obvious abnormality, atraumatic Neck: no adenopathy, no JVD, supple, symmetrical, trachea midline, and thyroid not enlarged, symmetric, no tenderness/mass/nodules Lymph nodes:  Cervical, supraclavicular, and axillary nodes normal. Resp: clear to auscultation bilaterally Back: symmetric, no curvature. ROM normal. No CVA tenderness. Cardio: regular rate and rhythm, S1, S2 normal, no murmur, click, rub or gallop GI: soft, non-tender; bowel sounds normal; no masses,  no organomegaly Extremities: extremities normal, atraumatic, no cyanosis or edema Neurologic: Alert and oriented X 3, normal strength and tone. Normal symmetric reflexes. Normal coordination and gait  ECOG PERFORMANCE STATUS: 1 - Symptomatic but completely ambulatory  Blood pressure (!) 154/84, pulse 85, temperature (!) 97.4 F (36.3 C), temperature source Oral, resp. rate 16, height 5\' 2"  (1.575 m), weight 140 lb (63.5 kg), SpO2 100%.  LABORATORY DATA: Lab Results  Component Value Date   WBC 12.7 (H) 10/21/2022   HGB 12.5 10/21/2022   HCT 39.7 10/21/2022   MCV 92.8 10/21/2022   PLT 413 (H) 10/21/2022      Chemistry      Component Value Date/Time   NA 138 10/21/2022 0757   NA 142 12/24/2016 0816   K 4.2 10/21/2022 0757   K 4.4 12/24/2016 0816   CL 105 10/21/2022 0757   CL 106 06/30/2012 1004   CO2 26 10/21/2022 0757   CO2 28 12/24/2016 0816   BUN 19 10/21/2022 0757   BUN 15.8 12/24/2016 0816   CREATININE 1.20 (H) 10/21/2022 0757   CREATININE 1.1 12/24/2016 0816      Component Value Date/Time   CALCIUM 9.7 10/21/2022 0757   CALCIUM 9.6 12/24/2016 0816   ALKPHOS 74 10/21/2022 0757   ALKPHOS 80 12/24/2016 0816   AST 13 (L) 10/21/2022 0757   AST 20 12/24/2016 0816   ALT 8 10/21/2022 0757   ALT 11 12/24/2016 0816   BILITOT 0.5 10/21/2022 0757   BILITOT 0.34 12/24/2016 0816       RADIOGRAPHIC STUDIES: CT CHEST ABDOMEN PELVIS W CONTRAST  Result Date: 10/28/2022 CLINICAL DATA:  Staging non-small-cell lung cancer. XRT complete. * Tracking Code: BO * EXAM: CT CHEST, ABDOMEN, AND PELVIS WITH CONTRAST TECHNIQUE: Multidetector CT imaging of the chest, abdomen and pelvis was performed  following the standard protocol during bolus administration of intravenous contrast. RADIATION DOSE REDUCTION: This exam was performed according to the departmental dose-optimization program which includes automated exposure control, adjustment of the mA and/or kV according to patient size and/or use of iterative reconstruction technique. CONTRAST:  75mL OMNIPAQUE IOHEXOL 300 MG/ML  SOLN COMPARISON:  CT 04/15/2022 and older FINDINGS: CT CHEST FINDINGS Cardiovascular: Right IJ chest port in place. The port is accessed. Tip near the SVC right atrial junction. Implanted loop recorder along the anterior left hemithorax. The heart is nonenlarged. Trace pericardial fluid or thickening. Coronary artery calcifications are seen. The thoracic aorta has a normal course and caliber with scattered atherosclerotic calcified plaque. There is  significant plaque along the great vessels including the left common carotid and left subclavian. Please correlate for clinical evidence of the significant stenosis and further workup with CTA as clinically appropriate. There is some enlargement of the main pulmonary artery. Please correlate for pulmonary artery hypertension. Mediastinum/Nodes: Slightly patulous thoracic esophagus. Heterogeneous thyroid gland with stable small cystic area on the left. No specific imaging follow-up. No specific abnormal lymph node enlargement identified in the axillary regions, hilum or mediastinum. Few small less than 1 cm size mediastinal nodes are seen, nonpathologic by size criteria in the paratracheal regions. Lungs/Pleura: There is some breathing motion. Diffuse centrilobular emphysematous changes identified. Areas of some mild peribronchial thickening. On the left there is no consolidation, pneumothorax or effusion. 5 mm nodule in the left upper lobe on the previous examination is stable today on series 6, image 51. On the right persistent areas of bronchial debris in the right lower lobe. There is some  basilar scarring and atelectatic changes. The atelectatic changes are slightly increased in the lateral right lung base. Stable bandlike areas of opacity in the medial right upper lobe with some volume loss and retraction. Apical pleural thickening. Is also some pleural thickening at the right lung base which is similar to previous. Musculoskeletal: Osteopenia with degenerative changes. Augmentation cement again seen in the thoracic spine at T4 and T7. Severe compression with some kyphosis at T3 is stable. CT ABDOMEN PELVIS FINDINGS Hepatobiliary: No focal liver abnormality is seen. No gallstones, gallbladder wall thickening, or biliary dilatation. Pancreas: Unremarkable. No pancreatic ductal dilatation or surrounding inflammatory changes. Spleen: Normal in size without focal abnormality. Adrenals/Urinary Tract: Adrenal glands are unremarkable. Kidneys are normal, without renal calculi, focal lesion, or hydronephrosis. No suspicious renal lesions. Bladder is unremarkable. Stomach/Bowel: Stomach is within normal limits. Appendix appears normal. No evidence of bowel wall thickening, distention, or inflammatory changes. Scattered colonic stool. Vascular/Lymphatic: Extensive vascular calcifications. Normal caliber aorta and IVC. No specific abnormal lymph node enlargement identified in the abdomen and pelvis. Reproductive: Uterus and bilateral adnexa are unremarkable. Other: No abdominal wall hernia or abnormality. No abdominopelvic ascites. Musculoskeletal: Curvature and degenerative changes of the spine and pelvis. IMPRESSION: Overall no significant interval change. Stable posttreatment changes involving the right upper lung in tiny left-sided nodule. Underlying chronic lung changes. Stable severe compression of the T3 vertebral body and augmentation cement seen at T4 and T7. Persistent areas of mucous plugging along the right lung base with some pleural thickening. Chest port and loop recorder. Electronically Signed    By: Karen Kays M.D.   On: 10/28/2022 13:26    ASSESSMENT AND PLAN: This is a very pleasant 72 years old white female with metastatic non-small cell lung cancer diagnosed in October 2007 status post concurrent chemoradiation followed by consolidation chemotherapy and she is currently on maintenance treatment with single agent Alimta status post 233 cycles. The patient has been tolerating this treatment well but recently she started having more nausea as well as fatigue. She was found on previous CT scan of the chest in June 2023 to have slight further increase in the size of the poorly marginated irregular solid 1.7 cm anterior medial left upper lobe pulmonary nodule suspicious for metachronous primary bronchogenic carcinoma versus solitary metastasis. The patient underwent SBRT to this lesion and she tolerated it fairly well. The patient has been on observation for the last 24 months and she is feeling fine with no concerning complaints except for mild fatigue.  She had repeat CT scan of the  chest, abdomen and pelvis performed recently.  I personally and independently reviewed the scan and discussed the result with the patient and her daughter. Her scan showed no concerning findings for disease progression.    Metastatic Non-Small Cell Lung Cancer Stable disease on observation since last Alimta dose in January 2023. No new symptoms suggestive of disease progression. -Continue observation.  Dyspnea on Exertion Reports shortness of breath with exertion but improves with use of inhaler. -Continue current management.  Chronic Pain Managed by pain clinic with MS Contin and Morphine Sulfate. -Continue current management by pain clinic.  Osteoporosis Not currently taking prescribed Fosamax. -Recommend reaching out to primary care physician to discuss the need for Fosamax.  General Health Maintenance -Continue regular follow-up and monitoring.  She was advised to call immediately if she has  any other concerning symptoms in the interval. The patient voices understanding of current disease status and treatment options and is in agreement with the current care plan. All questions were answered. The patient knows to call the clinic with any problems, questions or concerns. We can certainly see the patient much sooner if necessary. The total time spent in the appointment was 30 minutes.   Disclaimer: This note was dictated with voice recognition software. Similar sounding words can inadvertently be transcribed and may not be corrected upon review.

## 2022-11-01 ENCOUNTER — Encounter: Payer: Self-pay | Admitting: Family Medicine

## 2022-11-25 NOTE — Progress Notes (Unsigned)
Palliative Medicine [facility]  Telephone:(336) [phone] Fax:(336) [phone]   Name: Danielle [name] Date: 11/25/2022 MRN: [id number]  DOB: 29-Nov-1950  Patient Care Team: Excell Seltzer, MD as PCP - General Si Gaul, MD as Consulting Physician (Oncology) Vilinda Flake, [facility] (Inactive) as Pharmacist (Pharmacist) Pickenpack-Cousar, Arty Baumgartner, NP as Nurse Practitioner (Nurse Practitioner) [facility], [facility], [place]   I connected with Danielle [name] on 11/25/22 at  3:00 PM EST by phone and verified that I am speaking with the correct person using two identifiers.   I discussed the limitations, risks, security and privacy concerns of performing an evaluation and management service by telemedicine and the availability of in-person appointments. I also discussed with the patient that there may be a patient responsible charge related to this service. The patient expressed understanding and agreed to proceed.   Other persons participating in the visit and their role in the encounter: n/a   Patient's location: home  Provider's location: [facility]   Chief Complaint: f/u of symptom management    INTERVAL HISTORY: Danielle [name] is a 72 y.o. 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 Past medical history also includes COPD, diabetes mellitus, insomnia, anemia, as well as a cerebral infarct(10/2014). Palliative ask to see for symptom and pain management and goals of care.   SOCIAL HISTORY:     reports that she quit smoking about 17 years ago. Her smoking use included cigarettes. She started smoking about 47 years ago. She has a 60 pack-year smoking history. She has never used smokeless tobacco. She reports that she does not drink alcohol and does not use drugs.  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 had chemo and radiation for lung ca   Thrush 12/11/2010    ALLERGIES:  is allergic to contrast media [iodinated contrast media], iohexol, sulfa drugs cross reactors, and co-trimoxazole injection [sulfamethoxazole-trimethoprim].  MEDICATIONS:  Current Outpatient Medications  Medication Sig Dispense Refill   albuterol (VENTOLIN HFA) 108 (90 Base) MCG/ACT inhaler TAKE 2 PUFFS BY MOUTH EVERY 6 HOURS AS NEEDED FOR WHEEZE OR SHORTNESS OF BREATH 8.5 each 5   alendronate (FOSAMAX) 70 MG tablet TAKE 1 TABLET BY MOUTH EVERY 7 (SEVEN) DAYS. TAKE WITH A FULL GLASS OF WATER ON AN EMPTY STOMACH. 12 tablet 1   amLODipine (NORVASC) 10 MG tablet TAKE 1 TABLET BY MOUTH EVERY DAY 90 tablet 0   aspirin 325 MG EC tablet TAKE 1 TABLET BY MOUTH EVERY DAY 90 tablet 0   atorvastatin (LIPITOR) 40 MG tablet TAKE 1 TABLET BY MOUTH EVERY DAY AT 6 PM 90 tablet 0   Cholecalciferol (VITAMIN D3) 50 MCG (2000 UT) TABS Take by mouth.     diphenhydrAMINE (BENADRYL)  25 MG tablet Take 1 tablet (25 mg total) by mouth as directed. 2 hours before CT scan. 5 tablet 0   FeFum-FePoly-FA-B Cmp-C-Biot (FOLIVANE-PLUS) CAPS TAKE 1 CAPSULE BY MOUTH EVERY DAY IN THE MORNING 90 capsule 3   fluticasone (FLONASE) 50 MCG/ACT nasal spray Place 1-2 sprays into both nostrils daily as needed. 16 g 5   furosemide (LASIX) 20 MG tablet TAKE 1 TABLET BY MOUTH EVERY DAY AS NEEDED FOR SWELLING 20 tablet 1   INCRUSE ELLIPTA 62.5  MCG/ACT AEPB INHALE 1 PUFF BY MOUTH EVERY DAY 30 each 5   lidocaine-prilocaine (EMLA) cream APPLY TO AFFECTED AREA FOR 1 DOSE 30 g 2   lidocaine-prilocaine (EMLA) cream Apply 1 Application topically once.     lisinopril (ZESTRIL) 20 MG tablet TAKE 2 TABLETS (40 MG TOTAL) BY MOUTH DAILY. 180 tablet 1   morphine (MSIR) 30 MG tablet Take 1 tablet (30 mg total) by mouth every 4 (four) hours as needed for severe pain (pain score 7-10). 60 tablet 0   Olopatadine HCl 0.2 % SOLN INSTILL 1 DROP INTO AFFECTED EYE(S) EVERY DAY 2.5 mL 1   ondansetron (ZOFRAN) 8 MG tablet Take 1 tablet (8 mg total) by mouth every 8 (eight) hours as needed for nausea or vomiting. 30 tablet 1   oxyCODONE ER (XTAMPZA ER) 18 MG C12A Take 18 mg by mouth every 8 (eight) hours. 90 capsule 0   predniSONE (DELTASONE) 50 MG tablet 1 tablet by mouth 13 hours ,7 hours and 1 hour  before CT scan 3 tablet 12   prochlorperazine (COMPAZINE) 10 MG tablet Take 1 tablet (10 mg total) by mouth every 6 (six) hours as needed. 30 tablet 2   triamcinolone (KENALOG) 0.025 % ointment APPLY TO AFFECTED AREA TWICE A DAY 30 g 0   No current facility-administered medications for this visit.    VITAL SIGNS: There were no vitals taken for this visit. There were no vitals filed for this visit.  Estimated body mass index is 25.61 kg/m as calculated from the following:   Height as of 10/30/22: 5\' 2"  (1.575 m).   Weight as of 10/30/22: 140 lb (63.5 kg).   PERFORMANCE STATUS (ECOG) : 1 - Symptomatic but completely ambulatory  Assessment NAD RRR Normal breathing pattern Alert and oriented x3  IMPRESSION:  Danielle [name] presents to clinic for follow-up.  They deny any issues with constipation or diarrhea, and have not required anti-nausea medication. Sleep is occasionally interrupted due to nocturia. Appetite and weight have remained stable.  They have two sons who are firemen and are available for emergency assistance if needed. Appreciative of her  family's support. Taking things one day at a time. The patient is scheduled to see Dr. Gwenyth [name] later this week.  Neoplasm related pain Ms. Pruett reports that her pain level has been consistent, or "the norm." Pain is well controlled on current regimen. Tolerating without difficulty. No unwanted side effects.  The patient is adherent to their Xtampza regimen, taking it three times daily at specific times, although they occasionally miss the 10 PM dose if she is asleep. They also take morphine, but only when she anticipates increased activity and walking, such as during church events or outings. The patient reports that increased walking exacerbates their back pain, and they have started using mobility aids in stores to manage this.  We discussed her current regimen at length. Xtampza 18mg  every 8 hours. Morphine IR as needed. Not requiring daily.  No changes to current regimen  at this time.   We will continue to closely monitor and adjust regimen as needed. Pain contract on file.    Constipation  Controlled  I discussed the importance of continued conversation with family and their medical providers regarding overall plan of care and treatment options, ensuring decisions are within the context of the patients values and GOCs.  PLAN:  Chronic Pain Stable on Xtampza. No issues with constipation or diarrhea. No need for antiemetics at this time. -Continue Xtampza as prescribed. -[facility] [place].  Intermittent Increased Pain with Activity Reports increased back pain with increased walking. Uses Morphine sparingly for these instances. -[facility] Morph[facility] for use as needed.  Follow-up Plan to coordinate next visit with Dr. Asa Lente schedule. Patient to be seen in 2-4 weeks.  Patient expressed understanding and was in agreement with this plan. She also understands that She can call the clinic at any time with any questions,  concerns, or complaints.   Any controlled substances utilized were prescribed in the context of palliative care. PDMP has been reviewed.    Visit consisted of counseling and education dealing with the complex and emotionally intense issues of symptom management and palliative care in the setting of serious and potentially life-threatening illness.  Willette Alma, AGPCNP-BC  Palliative Medicine Team/[facility]  *Please note that this is a verbal dictation therefore any spelling or grammatical errors are due to the "Dragon Medical One" system interpretation.

## 2022-11-27 ENCOUNTER — Other Ambulatory Visit: Payer: Self-pay

## 2022-11-27 DIAGNOSIS — G893 Neoplasm related pain (acute) (chronic): Secondary | ICD-10-CM

## 2022-11-27 DIAGNOSIS — C3490 Malignant neoplasm of unspecified part of unspecified bronchus or lung: Secondary | ICD-10-CM

## 2022-11-27 DIAGNOSIS — Z515 Encounter for palliative care: Secondary | ICD-10-CM

## 2022-11-27 DIAGNOSIS — C3410 Malignant neoplasm of upper lobe, unspecified bronchus or lung: Secondary | ICD-10-CM

## 2022-11-27 MED ORDER — XTAMPZA ER 18 MG PO C12A: 18.0000 mg | EXTENDED_RELEASE_CAPSULE | Freq: Three times a day (TID) | ORAL | 0 refills | Status: DC

## 2022-11-27 MED ORDER — MORPHINE SULFATE 30 MG PO TABS: 30.0000 mg | ORAL_TABLET | ORAL | 0 refills | Status: DC | PRN

## 2022-11-27 NOTE — Telephone Encounter (Signed)
Pt daughter called for medication refill, see associated orders.

## 2022-11-28 ENCOUNTER — Inpatient Hospital Stay: Payer: 59 | Attending: Nurse Practitioner

## 2022-11-28 ENCOUNTER — Encounter: Payer: Self-pay | Admitting: Nurse Practitioner

## 2022-11-28 DIAGNOSIS — Z515 Encounter for palliative care: Secondary | ICD-10-CM | POA: Diagnosis not present

## 2022-11-28 DIAGNOSIS — G893 Neoplasm related pain (acute) (chronic): Secondary | ICD-10-CM | POA: Diagnosis not present

## 2022-11-28 DIAGNOSIS — R530 Neoplastic (malignant) related fatigue: Secondary | ICD-10-CM | POA: Diagnosis not present

## 2022-11-28 DIAGNOSIS — C3490 Malignant neoplasm of unspecified part of unspecified bronchus or lung: Secondary | ICD-10-CM

## 2022-12-19 ENCOUNTER — Other Ambulatory Visit: Payer: Self-pay

## 2022-12-19 DIAGNOSIS — C3490 Malignant neoplasm of unspecified part of unspecified bronchus or lung: Secondary | ICD-10-CM

## 2022-12-19 MED ORDER — PROCHLORPERAZINE MALEATE 10 MG PO TABS: 10.0000 mg | ORAL_TABLET | Freq: Four times a day (QID) | ORAL | 2 refills | Status: DC | PRN

## 2022-12-19 MED ORDER — ONDANSETRON HCL 8 MG PO TABS: 8.0000 mg | ORAL_TABLET | Freq: Three times a day (TID) | ORAL | 1 refills | Status: DC | PRN

## 2022-12-19 NOTE — Telephone Encounter (Signed)
Pt daughter called for refills, see associated orders.

## 2022-12-26 ENCOUNTER — Other Ambulatory Visit: Payer: Self-pay | Admitting: Nurse Practitioner

## 2022-12-26 DIAGNOSIS — Z515 Encounter for palliative care: Secondary | ICD-10-CM

## 2022-12-26 DIAGNOSIS — G893 Neoplasm related pain (acute) (chronic): Secondary | ICD-10-CM

## 2022-12-26 DIAGNOSIS — C3410 Malignant neoplasm of upper lobe, unspecified bronchus or lung: Secondary | ICD-10-CM

## 2022-12-26 DIAGNOSIS — C3490 Malignant neoplasm of unspecified part of unspecified bronchus or lung: Secondary | ICD-10-CM

## 2022-12-26 MED ORDER — MORPHINE SULFATE 30 MG PO TABS: 30.0000 mg | ORAL_TABLET | ORAL | 0 refills | Status: DC | PRN

## 2022-12-26 MED ORDER — XTAMPZA ER 18 MG PO C12A: 18.0000 mg | EXTENDED_RELEASE_CAPSULE | Freq: Three times a day (TID) | ORAL | 0 refills | Status: DC

## 2023-01-02 NOTE — Progress Notes (Deleted)
Palliative Medicine [facility]  Telephone:(336) [phone] Fax:(336) [phone]   Name: Danielle [name] Date: 01/02/2023 MRN: [id number]  DOB: 12/24/1950  Patient Care Team: Excell Seltzer, MD as PCP - General Si Gaul, MD as Consulting Physician (Oncology) Vilinda Flake, [facility] (Inactive) as Pharmacist (Pharmacist) Pickenpack-Cousar, Arty Baumgartner, NP as Nurse Practitioner (Nurse Practitioner) [facility], [facility] [place], [place]   I connected with Danielle [name] on 01/02/23 at  3:00 PM EST by phone and verified that I am speaking with the correct person using two identifiers.   I discussed the limitations, risks, security and privacy concerns of performing an evaluation and management service by telemedicine and the availability of in-person appointments. I also discussed with the patient that there may be a patient responsible charge related to this service. The patient expressed understanding and agreed to proceed.   Other persons participating in the visit and their role in the encounter: n/a   Patient's location: home  Provider's location: [facility]   Chief Complaint: f/u of symptom management    INTERVAL HISTORY: Danielle [name] is a 72 y.o. 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 Past medical history also includes COPD, diabetes mellitus, insomnia, anemia, as well as a cerebral infarct(10/2014). Palliative ask to see for symptom and pain management and goals of care.   SOCIAL HISTORY:     reports that she quit smoking about 17 years ago. Her smoking use included cigarettes. She started smoking about 47 years ago. She has a 60 pack-year smoking history. She has never used smokeless tobacco. She reports that she does not drink alcohol and does not use drugs.  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 had chemo and radiation for lung ca   Thrush 12/11/2010    ALLERGIES:  is allergic to contrast media [iodinated contrast media], iohexol, sulfa drugs cross reactors, and co-trimoxazole injection [sulfamethoxazole-trimethoprim].  MEDICATIONS:  Current Outpatient Medications  Medication Sig Dispense Refill   albuterol (VENTOLIN HFA) 108 (90 Base) MCG/ACT inhaler TAKE 2 PUFFS BY MOUTH EVERY 6 HOURS AS NEEDED FOR WHEEZE OR SHORTNESS OF BREATH 8.5 each 5   alendronate (FOSAMAX) 70 MG tablet TAKE 1 TABLET BY MOUTH EVERY 7 (SEVEN) DAYS. TAKE WITH A FULL GLASS OF WATER ON AN EMPTY STOMACH. 12 tablet 1   amLODipine (NORVASC) 10 MG tablet TAKE 1 TABLET BY MOUTH EVERY DAY 90 tablet 0   aspirin 325 MG EC tablet TAKE 1 TABLET BY MOUTH EVERY DAY 90 tablet 0   atorvastatin (LIPITOR) 40 MG tablet TAKE 1 TABLET BY MOUTH EVERY DAY AT 6 PM 90 tablet 0   Cholecalciferol (VITAMIN D3) 50 MCG (2000 UT) TABS Take by mouth.     diphenhydrAMINE (BENADRYL)  25 MG tablet Take 1 tablet (25 mg total) by mouth as directed. 2 hours before CT scan. 5 tablet 0   FeFum-FePoly-FA-B Cmp-C-Biot (FOLIVANE-PLUS) CAPS TAKE 1 CAPSULE BY MOUTH EVERY DAY IN THE MORNING 90 capsule 3   fluticasone (FLONASE) 50 MCG/ACT nasal spray Place 1-2 sprays into both nostrils daily as needed. 16 g 5   furosemide (LASIX) 20 MG tablet TAKE 1 TABLET BY MOUTH EVERY DAY AS NEEDED FOR SWELLING 20 tablet 1   INCRUSE ELLIPTA 62.5  MCG/ACT AEPB INHALE 1 PUFF BY MOUTH EVERY DAY 30 each 5   lidocaine-prilocaine (EMLA) cream APPLY TO AFFECTED AREA FOR 1 DOSE 30 g 2   lidocaine-prilocaine (EMLA) cream Apply 1 Application topically once.     lisinopril (ZESTRIL) 20 MG tablet TAKE 2 TABLETS (40 MG TOTAL) BY MOUTH DAILY. 180 tablet 1   morphine (MSIR) 30 MG tablet Take 1 tablet (30 mg total) by mouth every 4 (four) hours as needed for severe pain (pain score 7-10). 90 tablet 0   Olopatadine HCl 0.2 % SOLN INSTILL 1 DROP INTO AFFECTED EYE(S) EVERY DAY 2.5 mL 1   ondansetron (ZOFRAN) 8 MG tablet Take 1 tablet (8 mg total) by mouth every 8 (eight) hours as needed for nausea or vomiting. 30 tablet 1   oxyCODONE ER (XTAMPZA ER) 18 MG C12A Take 18 mg by mouth every 8 (eight) hours. 90 capsule 0   predniSONE (DELTASONE) 50 MG tablet 1 tablet by mouth 13 hours ,7 hours and 1 hour  before CT scan 3 tablet 12   prochlorperazine (COMPAZINE) 10 MG tablet Take 1 tablet (10 mg total) by mouth every 6 (six) hours as needed. 30 tablet 2   triamcinolone (KENALOG) 0.025 % ointment APPLY TO AFFECTED AREA TWICE A DAY 30 g 0   No current facility-administered medications for this visit.    VITAL SIGNS: There were no vitals taken for this visit. There were no vitals filed for this visit.  Estimated body mass index is 25.61 kg/m as calculated from the following:   Height as of 10/30/22: 5\' 2"  (1.575 m).   Weight as of 10/30/22: 140 lb (63.5 kg).   PERFORMANCE STATUS (ECOG) : 1 - Symptomatic but completely ambulatory   IMPRESSION:  I connected with Danielle [name] via phone for follow-up. No acute distress. Denies any concerns with nausea, vomiting, constipation, or diarrhea. Appetite is good. She is remaining as active as possible.   Danielle [name] reports her pain is controlled on current regimen. She is tolerating Xtampza 18mg  every 8 hours and MS IR as needed for breakthrough pain.  No changes to her regimen at this time.   I discussed the  importance of continued conversation with family and their medical providers regarding overall plan of care and treatment options, ensuring decisions are within the context of the patients values and GOCs.  PLAN:  Cancer Related Pain Stable on Xtampza. No issues with constipation or diarrhea. No need for antiemetics at this time. -Continue Xtampza 18mg  every 8 hours as prescribed. -Continu MS IR 30mg  every 4 hours as needed for breakthrough pain   Intermittent Increased Pain with Activity Reports increased back pain with increased walking. Uses Morphine sparingly for these instances. -[facility] for use as needed.  Follow-up Plan to coordinate next visit with Dr. Asa Lente schedule. Patient to be seen in 4-6 weeks.  Patient expressed understanding and was in agreement with this plan. She also understands that She  can call the clinic at any time with any questions, concerns, or complaints.   Any controlled substances utilized were prescribed in the context of palliative care. PDMP has been reviewed.   I provided 20 minutes of non face-to-face telephone visit time during this encounter. Visit consisted of counseling and education dealing with the complex and emotionally intense issues of symptom management and palliative care in the setting of serious and potentially life-threatening illness.  Danielle [name], AGPCNP-BC  Palliative Medicine Team/[facility]  *Please note that this is a verbal dictation therefore any spelling or grammatical errors are due to the "Dragon Medical One" system interpretation.

## 2023-01-06 ENCOUNTER — Inpatient Hospital Stay: Payer: 59 | Admitting: Nurse Practitioner

## 2023-01-07 ENCOUNTER — Inpatient Hospital Stay: Payer: 59 | Attending: Nurse Practitioner | Admitting: Nurse Practitioner

## 2023-01-07 ENCOUNTER — Encounter: Payer: Self-pay | Admitting: Nurse Practitioner

## 2023-01-07 DIAGNOSIS — C3490 Malignant neoplasm of unspecified part of unspecified bronchus or lung: Secondary | ICD-10-CM

## 2023-01-07 DIAGNOSIS — G893 Neoplasm related pain (acute) (chronic): Secondary | ICD-10-CM

## 2023-01-07 DIAGNOSIS — Z515 Encounter for palliative care: Secondary | ICD-10-CM

## 2023-01-07 NOTE — Progress Notes (Signed)
 Palliative Medicine [facility]  Telephone:(336) [phone] Fax:(336) [phone]   Name: Danielle [name] Date: 01/07/2023 MRN: [id number]  DOB: [birth year]  Patient Care Team: [name], MD as PCP - General Sherrod Sherrod, MD as Consulting Physician (Oncology) Myra Rosaline FALCON, [facility] (Inactive) as Pharmacist (Pharmacist) Pickenpack-Cousar, Fannie SAILOR, NP as Nurse Practitioner (Nurse Practitioner) [facility], [facility] [place], [place]   I connected with Avelina GORMAN Seto on 01/07/23 at  1:00 PM EST by phone and verified that I am speaking with the correct person using two identifiers.   I discussed the limitations, risks, security and privacy concerns of performing an evaluation and management service by telemedicine and the availability of in-person appointments. I also discussed with the patient that there may be a patient responsible charge related to this service. The patient expressed understanding and agreed to proceed.   Other persons participating in the visit and their role in the encounter: n/a   Patient's location: home  Provider's location: [facility]   Chief Complaint: f/u of symptom management    INTERVAL HISTORY: Danielle [name] is a 72 y.o. 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 Past medical history also includes COPD, diabetes mellitus, insomnia, anemia, as well as a cerebral infarct(10/2014). Palliative ask to see for symptom and pain management and goals of care.   SOCIAL HISTORY:     reports that she quit smoking about 17 years ago. Her smoking use included cigarettes. She started smoking about 47 years ago. She has a 60 pack-year smoking history. She has never used smokeless tobacco. She reports that she does not drink alcohol and does not use drugs.  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 had chemo and radiation for lung ca   Thrush 12/11/2010    ALLERGIES:  is allergic to contrast media [iodinated contrast media], iohexol , sulfa drugs cross reactors, and co-trimoxazole injection [sulfamethoxazole-trimethoprim].  MEDICATIONS:  Current Outpatient Medications  Medication Sig Dispense Refill   albuterol  (VENTOLIN  HFA) 108 (90 Base) MCG/ACT inhaler TAKE 2 PUFFS BY MOUTH EVERY 6 HOURS AS NEEDED FOR WHEEZE OR SHORTNESS OF BREATH 8.5 each 5   alendronate  (FOSAMAX ) 70 MG tablet TAKE 1 TABLET BY MOUTH EVERY 7 (SEVEN) DAYS. TAKE WITH A FULL GLASS OF WATER ON AN EMPTY STOMACH. 12 tablet 1   amLODipine  (NORVASC ) 10 MG tablet TAKE 1 TABLET BY MOUTH EVERY DAY 90 tablet 0   aspirin  325 MG EC tablet TAKE 1 TABLET BY MOUTH EVERY DAY 90 tablet 0   atorvastatin  (LIPITOR) 40 MG tablet TAKE 1 TABLET BY MOUTH EVERY DAY AT 6 PM 90 tablet 0   Cholecalciferol (VITAMIN D3) 50 MCG (2000 UT) TABS Take by mouth.     diphenhydrAMINE  (BENADRYL )  25 MG tablet Take 1 tablet (25 mg total) by mouth as directed. 2 hours before CT scan. 5 tablet 0   FeFum-FePoly-FA-B Cmp-C-Biot (FOLIVANE-PLUS) CAPS TAKE 1 CAPSULE BY MOUTH EVERY DAY IN THE MORNING 90 capsule 3   fluticasone  (FLONASE ) 50 MCG/ACT nasal spray Place 1-2 sprays into both nostrils daily as needed. 16 g 5   furosemide  (LASIX ) 20 MG tablet TAKE 1 TABLET BY MOUTH EVERY DAY AS NEEDED FOR SWELLING 20 tablet 1   INCRUSE ELLIPTA  62.5  MCG/ACT AEPB INHALE 1 PUFF BY MOUTH EVERY DAY 30 each 5   lidocaine -prilocaine  (EMLA ) cream APPLY TO AFFECTED AREA FOR 1 DOSE 30 g 2   lidocaine -prilocaine  (EMLA ) cream Apply 1 Application topically once.     lisinopril  (ZESTRIL ) 20 MG tablet TAKE 2 TABLETS (40 MG TOTAL) BY MOUTH DAILY. 180 tablet 1   morphine  (MSIR) 30 MG tablet Take 1 tablet (30 mg total) by mouth every 4 (four) hours as needed for severe pain (pain score 7-10). 90 tablet 0   Olopatadine  HCl 0.2 % SOLN INSTILL 1 DROP INTO AFFECTED EYE(S) EVERY DAY 2.5 mL 1   ondansetron  (ZOFRAN ) 8 MG tablet Take 1 tablet (8 mg total) by mouth every 8 (eight) hours as needed for nausea or vomiting. 30 tablet 1   oxyCODONE  ER (XTAMPZA  ER) 18 MG C12A Take 18 mg by mouth every 8 (eight) hours. 90 capsule 0   predniSONE  (DELTASONE ) 50 MG tablet 1 tablet by mouth 13 hours ,7 hours and 1 hour  before CT scan 3 tablet 12   prochlorperazine  (COMPAZINE ) 10 MG tablet Take 1 tablet (10 mg total) by mouth every 6 (six) hours as needed. 30 tablet 2   triamcinolone  (KENALOG ) 0.025 % ointment APPLY TO AFFECTED AREA TWICE A DAY 30 g 0   No current facility-administered medications for this visit.    VITAL SIGNS: There were no vitals taken for this visit. There were no vitals filed for this visit.  Estimated body mass index is 25.61 kg/m as calculated from the following:   Height as of 10/30/22: 5' 2 (1.575 m).   Weight as of 10/30/22: 140 lb (63.5 kg).   PERFORMANCE STATUS (ECOG) : 1 - Symptomatic but completely ambulatory   IMPRESSION:  I connected with [name] via phone for follow-up. No acute distress. Denies any concerns with nausea, vomiting, constipation, or diarrhea. Appetite is good. She is remaining as active as possible. Recently recovering from illness. Feeling much better. Ongoing hot flashes.   [name] reports her pain is controlled on current regimen. She is tolerating Xtampza  18mg  every 8 hours and MS IR as needed for  breakthrough pain.  No changes to her regimen at this time.   I discussed the importance of continued conversation with family and their medical providers regarding overall plan of care and treatment options, ensuring decisions are within the context of the patients values and GOCs.  PLAN:  Cancer Related Pain Stable on Xtampza . No issues with constipation or diarrhea. No need for antiemetics at this time. -Continue Xtampza  18mg  every 8 hours as prescribed. -Continu MS IR 30mg  every 4 hours as needed for breakthrough pain  Intermittent Increased Pain with Activity Reports increased back pain with increased walking. Uses Morphine  sparingly for these instances. -Refill Morphine  [facility] for use as needed.  Follow-up Plan to coordinate next visit with Dr. Jeannett schedule. Patient to be seen in 6-8 weeks.  Patient expressed understanding and was in  agreement with this plan. She also understands that She can call the clinic at any time with any questions, concerns, or complaints.   Any controlled substances utilized were prescribed in the context of palliative care. PDMP has been reviewed.   I provided 20 minutes of non face-to-face telephone visit time during this encounter. Visit consisted of counseling and education dealing with the complex and emotionally intense issues of symptom management and palliative care in the setting of serious and potentially life-threatening illness.  Levon Borer, AGPCNP-BC  Palliative Medicine Team/[facility]  *Please note that this is a verbal dictation therefore any spelling or grammatical errors are due to the Dragon Medical One system interpretation.

## 2023-01-27 ENCOUNTER — Other Ambulatory Visit: Payer: Self-pay

## 2023-01-27 ENCOUNTER — Other Ambulatory Visit: Payer: Self-pay | Admitting: Physician Assistant

## 2023-01-27 DIAGNOSIS — Z515 Encounter for palliative care: Secondary | ICD-10-CM

## 2023-01-27 DIAGNOSIS — C3490 Malignant neoplasm of unspecified part of unspecified bronchus or lung: Secondary | ICD-10-CM

## 2023-01-27 DIAGNOSIS — C3410 Malignant neoplasm of upper lobe, unspecified bronchus or lung: Secondary | ICD-10-CM

## 2023-01-27 DIAGNOSIS — G893 Neoplasm related pain (acute) (chronic): Secondary | ICD-10-CM

## 2023-01-27 MED ORDER — XTAMPZA ER 18 MG PO C12A: 18.0000 mg | EXTENDED_RELEASE_CAPSULE | Freq: Three times a day (TID) | ORAL | 0 refills | Status: DC

## 2023-01-27 NOTE — Telephone Encounter (Signed)
Patient daughter called for medication refill, see associated orders.

## 2023-02-17 NOTE — Progress Notes (Signed)
Palliative Medicine [facility]  [phone] [phone]   Name: Danielle [name] Date: 02/17/2023 MRN: [id number]  DOB: 29-Oct-1950  Patient Care Team: Excell Seltzer, MD as PCP - General Si Gaul, MD as Consulting Physician (Oncology) Vilinda Flake, [facility] (Inactive) as Pharmacist (Pharmacist) Pickenpack-Cousar, Arty Baumgartner, NP as Nurse Practitioner (Nurse Practitioner) [facility], [facility], [place]   I connected with Danielle [name] on 02/17/23 at  1:00 PM EST by phone and verified that I am speaking with the correct person using two identifiers.   I discussed the limitations, risks, security and privacy concerns of performing an evaluation and management service by telemedicine and the availability of in-person appointments. I also discussed with the patient that there may be a patient responsible charge related to this service. The patient expressed understanding and agreed to proceed.   Other persons participating in the visit and their role in the encounter: n/a   Patient's location: home  Provider's location: [facility]   Chief Complaint: f/u of symptom management    INTERVAL HISTORY: Danielle [name] is a 73 y.o. 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 Past medical history also includes COPD, diabetes mellitus, insomnia, anemia, as well as a cerebral infarct(10/2014). Palliative ask to see for symptom and pain management and goals of care.   SOCIAL HISTORY:     reports that she quit smoking about 17 years ago. Her smoking use included cigarettes. She started smoking about 47 years ago. She has a 60 pack-year smoking history. She has never used smokeless tobacco. She reports that she does not drink alcohol and does not use drugs.  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 had chemo and radiation for lung ca   Thrush 12/11/2010    ALLERGIES:  is allergic to contrast media [iodinated contrast media], iohexol, sulfa drugs cross reactors, and co-trimoxazole injection [sulfamethoxazole-trimethoprim].  MEDICATIONS:  Current Outpatient Medications  Medication Sig Dispense Refill   albuterol (VENTOLIN HFA) 108 (90 Base) MCG/ACT inhaler TAKE 2 PUFFS BY MOUTH EVERY 6 HOURS AS NEEDED FOR WHEEZE OR SHORTNESS OF BREATH 8.5 each 5   alendronate (FOSAMAX) 70 MG tablet TAKE 1 TABLET BY MOUTH EVERY 7 (SEVEN) DAYS. TAKE WITH A FULL GLASS OF WATER ON AN EMPTY STOMACH. 12 tablet 1   amLODipine (NORVASC) 10 MG tablet TAKE 1 TABLET BY MOUTH EVERY DAY 90 tablet 0   aspirin 325 MG EC tablet TAKE 1 TABLET BY MOUTH EVERY DAY 90 tablet 0   atorvastatin (LIPITOR) 40 MG tablet TAKE 1 TABLET BY MOUTH EVERY DAY AT 6 PM 90 tablet 0   Cholecalciferol (VITAMIN D3) 50 MCG (2000 UT) TABS Take by mouth.     diphenhydrAMINE (BENADRYL)  25 MG tablet Take 1 tablet (25 mg total) by mouth as directed. 2 hours before CT scan. 5 tablet 0   FeFum-FePoly-FA-B Cmp-C-Biot (FOLIVANE-PLUS) CAPS TAKE 1 CAPSULE BY MOUTH EVERY DAY IN THE MORNING 90 capsule 3   fluticasone (FLONASE) 50 MCG/ACT nasal spray Place 1-2 sprays into both nostrils daily as needed. 16 g 5   furosemide (LASIX) 20 MG tablet TAKE 1 TABLET BY MOUTH EVERY DAY AS NEEDED FOR SWELLING 20 tablet 1   INCRUSE ELLIPTA 62.5  MCG/ACT AEPB INHALE 1 PUFF BY MOUTH EVERY DAY 30 each 5   lidocaine-prilocaine (EMLA) cream APPLY TO AFFECTED AREA FOR 1 DOSE 30 g 2   lidocaine-prilocaine (EMLA) cream Apply 1 Application topically once.     lisinopril (ZESTRIL) 20 MG tablet TAKE 2 TABLETS (40 MG TOTAL) BY MOUTH DAILY. 180 tablet 1   morphine (MSIR) 30 MG tablet Take 1 tablet (30 mg total) by mouth every 4 (four) hours as needed for severe pain (pain score 7-10). 90 tablet 0   Olopatadine HCl 0.2 % SOLN INSTILL 1 DROP INTO AFFECTED EYE(S) EVERY DAY 2.5 mL 1   ondansetron (ZOFRAN) 8 MG tablet Take 1 tablet (8 mg total) by mouth every 8 (eight) hours as needed for nausea or vomiting. 30 tablet 1   oxyCODONE ER (XTAMPZA ER) 18 MG C12A Take 18 mg by mouth every 8 (eight) hours. 90 capsule 0   predniSONE (DELTASONE) 50 MG tablet 1 tablet by mouth 13 hours ,7 hours and 1 hour  before CT scan 3 tablet 12   prochlorperazine (COMPAZINE) 10 MG tablet Take 1 tablet (10 mg total) by mouth every 6 (six) hours as needed. 30 tablet 2   triamcinolone (KENALOG) 0.025 % ointment APPLY TO AFFECTED AREA TWICE A DAY 30 g 0   No current facility-administered medications for this visit.    VITAL SIGNS: There were no vitals taken for this visit. There were no vitals filed for this visit.  Estimated body mass index is 25.61 kg/m as calculated from the following:   Height as of 10/30/22: 5\' 2"  (1.575 m).   Weight as of 10/30/22: 140 lb (63.5 kg).   PERFORMANCE STATUS (ECOG) : 1 - Symptomatic but completely ambulatory   IMPRESSION:  I connected with Mrs. Danielle [name] via phone for follow-up. No acute distress. Denies any concerns with nausea, vomiting, constipation, or diarrhea. Appetite is good. She is remaining as active as possible.   [name] reports her pain is well controlled on current regimen. She is tolerating Xtampza 18mg  every 8 hours and MS IR as needed for breakthrough pain.  No changes to her regimen at this time.   I discussed the  importance of continued conversation with family and their medical providers regarding overall plan of care and treatment options, ensuring decisions are within the context of the patients values and GOCs.  PLAN:  Cancer Related Pain Stable on Xtampza. No issues with constipation or diarrhea. No need for antiemetics at this time. -Continue Xtampza 18mg  every 8 hours as prescribed. -Continu MS IR 30mg  every 4 hours as needed for breakthrough pain  Intermittent Increased Pain with Activity Reports increased back pain with increased walking. Uses Morphine sparingly for these instances. -Refill Morphine IR prescription sent to [facility] in [facility] for use as needed.  Follow-up Patient to be seen in 6-8 weeks.  Patient expressed understanding and was in agreement with this plan. She also understands that She can call the clinic at any time with any  questions, concerns, or complaints.   Any controlled substances utilized were prescribed in the context of palliative care. PDMP has been reviewed.   I provided 15 minutes of non face-to-face telephone visit time during this encounter. Visit consisted of counseling and education dealing with the complex and emotionally intense issues of symptom management and palliative care in the setting of serious and potentially life-threatening illness.  Willette Alma, AGPCNP-BC  Palliative Medicine Team/[facility]

## 2023-02-18 ENCOUNTER — Encounter: Payer: Self-pay | Admitting: Nurse Practitioner

## 2023-02-18 ENCOUNTER — Inpatient Hospital Stay: Payer: 59 | Attending: Nurse Practitioner

## 2023-02-18 DIAGNOSIS — G893 Neoplasm related pain (acute) (chronic): Secondary | ICD-10-CM

## 2023-02-18 DIAGNOSIS — C3410 Malignant neoplasm of upper lobe, unspecified bronchus or lung: Secondary | ICD-10-CM

## 2023-02-18 DIAGNOSIS — C3490 Malignant neoplasm of unspecified part of unspecified bronchus or lung: Secondary | ICD-10-CM

## 2023-02-18 DIAGNOSIS — Z515 Encounter for palliative care: Secondary | ICD-10-CM | POA: Diagnosis not present

## 2023-02-18 MED ORDER — XTAMPZA ER 18 MG PO C12A
18.0000 mg | EXTENDED_RELEASE_CAPSULE | Freq: Three times a day (TID) | ORAL | 0 refills | Status: DC
Start: 2023-02-26 — End: 2023-03-27

## 2023-02-21 ENCOUNTER — Ambulatory Visit: Payer: 59

## 2023-02-21 VITALS — Ht 62.75 in | Wt 140.0 lb

## 2023-02-21 DIAGNOSIS — Z0000 Encounter for general adult medical examination without abnormal findings: Secondary | ICD-10-CM | POA: Diagnosis not present

## 2023-02-21 NOTE — Progress Notes (Signed)
Subjective:   Danielle [name] is a 73 y.o. female who presents for Medicare Annual (Subsequent) preventive examination.  Visit Complete: Virtual I connected with  Danielle [name] on 02/21/23 by telephone application and verified that I am speaking with the correct person using two identifiers.  Patient Location: Home  Provider Location: Office/Clinic  I discussed the limitations of evaluation and management by telemedicine. The patient expressed understanding and agreed to proceed.  Vital Signs: Because this visit was a virtual/telehealth visit, some criteria may be missing or patient reported. Any vitals not documented were not able to be obtained and vitals that have been documented are patient reported.  Patient Medicare AWV questionnaire was completed by the patient on 02/21/23; I have confirmed that all information answered by patient is correct and no changes since this date.  Cardiac Risk Factors include: advanced age (>48men, >6 women);diabetes mellitus;dyslipidemia;hypertension;sedentary lifestyle     Objective:    Today's Vitals   02/21/23 1350 02/21/23 1407  Weight:  140 lb (63.5 kg)  Height:  5' 2.75" (1.594 m)  PainSc: 5     Body mass index is 25 kg/m.     02/21/2023    2:15 PM 04/29/2022    9:01 AM 06/27/2021   10:41 AM 06/18/2021   10:20 AM 01/09/2021   10:26 AM 12/19/2020    9:54 AM 11/28/2020   10:41 AM  Advanced Directives  Does Patient Have a Medical Advance Directive? Yes Yes;No Yes Yes Yes Yes Yes  Type of Estate agent of [place];Living will Out of facility DNR (pink MOST or yellow form) Living will;Healthcare Power of [employer] Power of [employer] Power of [employer] Power of [employer] Power of Attorney  Does patient want to make changes to medical advance directive?  No - Patient declined     No - Patient declined  Copy of Healthcare Power of Attorney in Chart? Yes - validated most recent  copy scanned in chart (See row information)   No - copy requested No - copy requested No - copy requested No - copy requested  Would patient like information on creating a medical advance directive?  No - Patient declined         Current Medications (verified) Outpatient Encounter Medications as of 02/21/2023  Medication Sig   albuterol (VENTOLIN HFA) 108 (90 Base) MCG/ACT inhaler TAKE 2 PUFFS BY MOUTH EVERY 6 HOURS AS NEEDED FOR WHEEZE OR SHORTNESS OF BREATH   alendronate (FOSAMAX) 70 MG tablet TAKE 1 TABLET BY MOUTH EVERY 7 (SEVEN) DAYS. TAKE WITH A FULL GLASS OF WATER ON AN EMPTY STOMACH.   amLODipine (NORVASC) 10 MG tablet TAKE 1 TABLET BY MOUTH EVERY DAY   aspirin 325 MG EC tablet TAKE 1 TABLET BY MOUTH EVERY DAY   atorvastatin (LIPITOR) 40 MG tablet TAKE 1 TABLET BY MOUTH EVERY DAY AT 6 PM   Cholecalciferol (VITAMIN D3) 50 MCG (2000 UT) TABS Take by mouth.   diphenhydrAMINE (BENADRYL) 25 MG tablet Take 1 tablet (25 mg total) by mouth as directed. 2 hours before CT scan.   FeFum-FePoly-FA-B Cmp-C-Biot (FOLIVANE-PLUS) CAPS TAKE 1 CAPSULE BY MOUTH EVERY DAY IN THE MORNING   fluticasone (FLONASE) 50 MCG/ACT nasal spray Place 1-2 sprays into both nostrils daily as needed.   furosemide (LASIX) 20 MG tablet TAKE 1 TABLET BY MOUTH EVERY DAY AS NEEDED FOR SWELLING   INCRUSE ELLIPTA 62.5 MCG/ACT AEPB INHALE 1 PUFF BY MOUTH EVERY DAY   lidocaine-prilocaine (EMLA) cream  APPLY TO AFFECTED AREA FOR 1 DOSE   lidocaine-prilocaine (EMLA) cream Apply 1 Application topically once.   lisinopril (ZESTRIL) 20 MG tablet TAKE 2 TABLETS (40 MG TOTAL) BY MOUTH DAILY.   morphine (MSIR) 30 MG tablet Take 1 tablet (30 mg total) by mouth every 4 (four) hours as needed for severe pain (pain score 7-10).   Olopatadine HCl 0.2 % SOLN INSTILL 1 DROP INTO AFFECTED EYE(S) EVERY DAY   ondansetron (ZOFRAN) 8 MG tablet Take 1 tablet (8 mg total) by mouth every 8 (eight) hours as needed for nausea or vomiting.   [START ON  02/26/2023] oxyCODONE ER (XTAMPZA ER) 18 MG C12A Take 18 mg by mouth every 8 (eight) hours.   predniSONE (DELTASONE) 50 MG tablet 1 tablet by mouth 13 hours ,7 hours and 1 hour  before CT scan   prochlorperazine (COMPAZINE) 10 MG tablet Take 1 tablet (10 mg total) by mouth every 6 (six) hours as needed.   triamcinolone (KENALOG) 0.025 % ointment APPLY TO AFFECTED AREA TWICE A DAY   No facility-administered encounter medications on file as of 02/21/2023.    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   LOOP RECORDER IMPLANT N/A 04/19/2014   Procedure: LOOP RECORDER IMPLANT;  Surgeon: Hillis Range, MD;  Location: [facility] CATH LAB;  Service: Cardiovascular;  Laterality: N/A;   OTHER SURGICAL HISTORY  2008   Gamma knife surgery to remove brain met    PORTACATH PLACEMENT  -[name]   TIP IN CAVOATRIAL JUNCTION   TEE WITHOUT CARDIOVERSION N/A 04/19/2014   Procedure: TRANSESOPHAGEAL ECHOCARDIOGRAM (TEE);  Surgeon: Laurey Morale, MD;  [facility] ENDOSCOPY;  Service: Cardiovascular;  Laterality:  N/A;   Family History  Problem Relation Age of Onset   Diabetes Father    Lymphoma Father    Stroke Father    Liver cancer Mother    Lung cancer Mother    Cancer Mother    Cancer Sister 75       ovarian cancer   Hyperlipidemia Brother    Healthy Daughter    Healthy Son    Healthy Son    Social History   Socioeconomic History   Marital status: Divorced    Spouse name: Not on file   Number of children: 3   Years of education: Not on file   Highest education level: Not on file  Occupational History   Occupation: DISABLED    Employer: UNEMPLOYED  Tobacco Use   Smoking status: Former  Current packs/day: 0.00    Average packs/day: 2.0 packs/day for 30.0 years (60.0 ttl pk-yrs)    Types: Cigarettes    Start date: 10/08/1975    Quit date: 10/07/2005    Years since quitting: 17.3   Smokeless tobacco: Never  Vaping Use   Vaping status: Never Used  Substance and Sexual Activity   Alcohol use: No   Drug use: No   Sexual activity: Never  Other Topics Concern   Not on file  Social History Narrative   Divorced (alcoholic)    Regular exercise: yes, walking   Diet: fruits and veggies, walking    No living will,  HCPOA (son Metro Kung III), DNR (reviewed 2014)               Social Drivers of Health   Financial Resource Strain: Low Risk  (02/21/2023)   Overall Financial Resource Strain (CARDIA)    Difficulty of Paying Living Expenses: Not very hard  Food Insecurity: Food Insecurity Present (02/21/2023)   Hunger Vital Sign    Worried About Running Out of Food in the Last Year: Never true    Ran Out of Food in the Last Year: Sometimes true  Transportation Needs: No Transportation Needs (02/21/2023)   PRAPARE - Administrator, Civil Service (Medical): No    Lack of Transportation (Non-Medical): No  Physical Activity: Insufficiently Active (02/21/2023)   Exercise Vital Sign    Days of Exercise per Week: 1 day    Minutes of Exercise per Session: 50 min  Stress:  No Stress Concern Present (02/21/2023)   [employer] of Occupational Health - Occupational Stress Questionnaire    Feeling of Stress : Not at all  Social Connections: Moderately Isolated (02/21/2023)   Social Connection and Isolation Panel [NHANES]    Frequency of Communication with Friends and Family: More than three times a week    Frequency of Social Gatherings with Friends and Family: Twice a week    Attends Religious Services: More than 4 times per year    Active Member of [employer] or Organizations: No    Attends Engineer, structural: Not on file    Marital Status: Divorced    Tobacco Counseling Counseling given: Not Answered   Clinical Intake:  Pre-visit preparation completed: Yes  Pain : 0-10 Pain Score: 5  Pain Type: Chronic pain Pain Location: Back Pain Orientation: Upper Pain Descriptors / Indicators: Aching Pain Onset: More than a month ago Pain Frequency: Constant Pain Relieving Factors: lying down,medication  Pain Relieving Factors: lying down,medication  BMI - recorded: 25 Nutritional Status: BMI 25 -29 Overweight Nutritional Risks: None Diabetes: Yes CBG done?: No Did pt. bring in CBG monitor from home?: No  How often do you need to have someone help you when you read instructions, pamphlets, or other written materials from your doctor or pharmacy?: 1 - Never  Interpreter Needed?: No  Comments: lives with son and daughter Information entered by :: B.Damita Eppard,LPN   Activities of Daily Living    02/21/2023    1:50 PM  In your present state of health, do you have any difficulty performing the following activities:  Hearing? 0  Vision? 1  Difficulty concentrating or making decisions? 0  Walking or climbing stairs? 1  Dressing or bathing? 0  Doing errands, shopping? 1  Preparing Food and eating ? N  Using the Toilet? N  In the past six months, have you accidently leaked urine? N  Do you have problems with  loss of bowel control? N   Managing your Medications? N  Managing your Finances? N  Housekeeping or managing your Housekeeping? Y    Patient Care Team: Excell Seltzer, MD as PCP - General Si Gaul, MD as Consulting Physician (Oncology) Vilinda Flake, [facility] (Inactive) as Pharmacist (Pharmacist) Pickenpack-Cousar, Arty Baumgartner, NP as Nurse Practitioner (Nurse Practitioner) [facility], [facility], [facility], [facility], [place]  Indicate any recent Medical Services you may have received from other than [facility] providers in the past year (date may be approximate).     Assessment:   This is a routine wellness examination for [place].  Hearing/Vision screen Hearing Screening - Comments:: Pt says her hearing is good Vision Screening - Comments:: Pt say vision is good w/glasses [facility] Dr Lew Dawes   Goals Addressed             This Visit's Progress    Patient Stated       Pt says she would like to be here       Depression Screen    02/21/2023    2:13 PM 10/15/2022   11:55 AM 09/14/2021    9:46 AM 09/12/2020   10:26 AM 08/31/2019   12:07 PM 08/28/2018   10:20 AM 08/05/2017   11:12 AM  PHQ 2/9 Scores  PHQ - 2 Score 0 1 0 0 0 0 0  PHQ- 9 Score  2   0  0    Fall Risk    02/21/2023    1:50 PM 10/15/2022   11:56 AM 09/14/2021    9:46 AM 09/12/2020   10:26 AM 08/31/2019   12:07 PM  Fall Risk   Falls in the past year? 1 0 0 0 0  Number falls in past yr: 0 0  0 0  Injury with Fall? 0 0  0 0  Risk for fall due to : No Fall Risks;Orthopedic patient No Fall Risks   Medication side effect  Follow up Education provided;Falls prevention discussed Falls evaluation completed   Falls evaluation completed;Falls prevention discussed    MEDICARE RISK AT HOME: Medicare Risk at Home Any stairs in or around the home?: (Patient-Rptd) No If so, are there any without handrails?: (Patient-Rptd) No Home free of loose throw rugs in walkways, pet beds, electrical cords, etc?: (Patient-Rptd) Yes Adequate lighting in your home to  reduce risk of falls?: (Patient-Rptd) Yes Life alert?: (Patient-Rptd) No Use of a cane, walker or w/c?: (Patient-Rptd) Yes Grab bars in the bathroom?: (Patient-Rptd) No Shower chair or bench in shower?: (Patient-Rptd) No Elevated toilet seat or a handicapped toilet?: (Patient-Rptd) No  TIMED UP AND GO:  Was the test performed?  No    Cognitive Function:    08/31/2019   12:08 PM 08/05/2017   11:20 AM 07/27/2015    8:46 AM  MMSE - Mini Mental State Exam  Orientation to time 5 5 5   Orientation to Place 5 5 5   Registration 3 3 3   Attention/ Calculation 5 0 0  Recall 3 3 2   Recall-comments   pt was unable to recall 1 of 3 words  Language- name 2 objects  0 0  Language- repeat 1 1 1   Language- follow 3 step command  3 3  Language- read & follow direction  0 0  Write a sentence  0 0  Copy design  0 0  Total score  20 19        02/21/2023    2:18 PM  6CIT Screen  What Year? 0 points  What month? 0 points  What time? 0 points  Count back from 20 0 points  Months in reverse 0 points  Repeat phrase 2 points  Total Score 2 points    Immunizations Immunization History  Administered Date(s) Administered   Fluad Quad(high Dose 65+) 11/30/2019, 10/18/2020   Fluad Trivalent(High Dose 65+) 10/15/2022   Influenza Split 10/01/2011   Influenza Whole 11/08/2007, 12/07/2008   Influenza,inj,Quad PF,6+ Mos 10/13/2012, 09/14/2013, 09/27/2014, 10/01/2016, 09/23/2017, 09/15/2018   Moderna Sars-Covid-2 Vaccination 05/13/2019, 06/10/2019, 09/14/2019   Pneumococcal Conjugate-13 08/28/2018   Pneumococcal Polysaccharide-23 10/16/2005, 02/12/2011, 09/03/2019   Td 01/07/2002, 01/08/2008    TDAP status: Up to date  Flu Vaccine status: Up to date  Pneumococcal vaccine status: Up to date  Covid-19 vaccine status: Completed vaccines  Qualifies for Shingles Vaccine? Yes   Zostavax completed No   Shingrix Completed?: No.    Education has been provided regarding the importance of this  vaccine. Patient has been advised to call insurance company to determine out of pocket expense if they have not yet received this vaccine. Advised may also receive vaccine at local pharmacy or Health Dept. Verbalized acceptance and understanding.  Screening Tests Health Maintenance  Topic Date Due   Zoster Vaccines- Shingrix (1 of 2) Never done   MAMMOGRAM  06/21/2010   Diabetic kidney evaluation - Urine ACR  01/18/2011   DTaP/Tdap/Td (3 - Tdap) 01/07/2018   OPHTHALMOLOGY EXAM  06/19/2021   COVID-19 Vaccine (4 - 2024-25 season) 09/08/2022   Colonoscopy  08/31/2023 (Originally 06/21/2018)   HEMOGLOBIN A1C  04/15/2023   FOOT EXAM  10/15/2023   Diabetic kidney evaluation - eGFR measurement  10/21/2023   Pneumonia Vaccine 20+ Years old  Completed   INFLUENZA VACCINE  Completed   DEXA SCAN  Completed   Hepatitis C Screening  Completed   HPV VACCINES  Aged Out    Health Maintenance  Health Maintenance Due  Topic Date Due   Zoster Vaccines- Shingrix (1 of 2) Never done   MAMMOGRAM  06/21/2010   Diabetic kidney evaluation - Urine ACR  01/18/2011   DTaP/Tdap/Td (3 - Tdap) 01/07/2018   OPHTHALMOLOGY EXAM  06/19/2021   COVID-19 Vaccine (4 - 2024-25 season) 09/08/2022    Colorectal cancer screening: Type of screening: Colonoscopy. Completed no last 06/21/2019. Repeat every 10 years Pt says she will not get again  Mammogram status: Completed no. Repeat every year  Bone Density status: Completed 12/20/2019. Results reflect: Bone density results: OSTEOPOROSIS. Repeat every 2-3 years.  Lung Cancer Screening: (Low Dose CT Chest recommended if Age 93-80 years, 20 pack-year currently smoking OR have quit w/in 15years.) does not qualify.   Lung Cancer Screening Referral: no  Additional Screening:  Hepatitis C Screening: does not qualify; Completed 07/27/2015  Vision Screening: Recommended annual ophthalmology exams for early detection of glaucoma and other disorders of the eye. Is the  patient up to date with their annual eye exam?  Yes  Who is the provider or what is the name of the office in which the patient attends annual eye exams? Dr Lew Dawes If pt is not established with a provider, would they like to be referred to a provider to establish care? No .   Dental Screening: Recommended annual dental exams for proper oral hygiene  Diabetic Foot Exam: Diabetic Foot Exam: Overdue, Pt has been advised about the importance in completing this exam. Pt is scheduled for diabetic foot exam on next PCP appt.  Community Resource Referral /  Chronic Care Management: CRR required this visit?  No   CCM required this visit?  No PT DECLINED TO SCHEDULE APPT W/PCP says she will call      Plan:     I have personally reviewed and noted the following in the patient's chart:   Medical and social history Use of alcohol, tobacco or illicit drugs  Current medications and supplements including opioid prescriptions. Patient is currently taking opioid prescriptions. Information provided to patient regarding non-opioid alternatives. Patient advised to discuss non-opioid treatment plan with their provider. Functional ability and status Nutritional status Physical activity Advanced directives List of other physicians Hospitalizations, surgeries, and ER visits in previous 12 months Vitals Screenings to include cognitive, depression, and falls Referrals and appointments  In addition, I have reviewed and discussed with patient certain preventive protocols, quality metrics, and best practice recommendations. A written personalized care plan for preventive services as well as general preventive health recommendations were provided to patient.    Sue Lush, LPN   [id number]/61/0960   After Visit Summary: (MyChart) Due to this being a telephonic visit, the after visit summary with patients personalized plan was offered to patient via MyChart   Nurse Notes: Pt says she manages back pain everyday  relaying it worsens with walking. She relays she has to lie down to help pain decrease. Pt has no concerns or questions at this time.

## 2023-02-21 NOTE — Patient Instructions (Signed)
Danielle [name] , Thank you for taking time to come for your Medicare Wellness Visit. I appreciate your ongoing commitment to your health goals. Please review the following plan we discussed and let me know if I can assist you in the future.   Referrals/Orders/Follow-Ups/Clinician Recommendations: none  This is a list of the screening recommended for you and due dates:  Health Maintenance  Topic Date Due   Zoster (Shingles) Vaccine (1 of 2) Never done   Mammogram  06/21/2010   Yearly kidney health urinalysis for diabetes  01/18/2011   DTaP/Tdap/Td vaccine (3 - Tdap) 01/07/2018   Eye exam for diabetics  06/19/2021   COVID-19 Vaccine (4 - 2024-25 season) 09/08/2022   Colon Cancer Screening  08/31/2023*   Hemoglobin A1C  04/15/2023   Complete foot exam   10/15/2023   Yearly kidney function blood test for diabetes  10/21/2023   Pneumonia Vaccine  Completed   Flu Shot  Completed   DEXA scan (bone density measurement)  Completed   Hepatitis C Screening  Completed   HPV Vaccine  Aged Out  *Topic was postponed. The date shown is not the original due date.    Advanced directives: (In Chart) A copy of your advanced directives are scanned into your chart should your provider ever need it.  Next Medicare Annual Wellness Visit scheduled for next year: Yes 02/24/2024 @ 3pm televisit

## 2023-02-24 ENCOUNTER — Other Ambulatory Visit: Payer: Self-pay | Admitting: Family Medicine

## 2023-03-04 ENCOUNTER — Other Ambulatory Visit: Payer: Self-pay | Admitting: Family Medicine

## 2023-03-05 NOTE — Telephone Encounter (Signed)
 Noted.

## 2023-03-05 NOTE — Telephone Encounter (Signed)
 I sent in a 90 day supply on her Atorvastatin but she hasn't had her Lipid checked since 2022.  I know she is currently on Palliative Care.  Last office visit with states:   She is not interested in full panel including lipids as she has complete metabolic panel and CBC at oncologist office. She wishes to continue atorvastatin at current dose. Was that okay to refill?

## 2023-03-12 ENCOUNTER — Telehealth: Payer: Self-pay | Admitting: Nurse Practitioner

## 2023-03-12 NOTE — Telephone Encounter (Signed)
 Left patient a voicemail in regards to scheduled appointment times/dates for follow up appointment; left callback number for scheduling if patent is needing to reschedule or cancel appointment

## 2023-03-27 ENCOUNTER — Other Ambulatory Visit: Payer: Self-pay

## 2023-03-27 DIAGNOSIS — C3410 Malignant neoplasm of upper lobe, unspecified bronchus or lung: Secondary | ICD-10-CM

## 2023-03-27 DIAGNOSIS — C3490 Malignant neoplasm of unspecified part of unspecified bronchus or lung: Secondary | ICD-10-CM

## 2023-03-27 DIAGNOSIS — G893 Neoplasm related pain (acute) (chronic): Secondary | ICD-10-CM

## 2023-03-27 DIAGNOSIS — Z515 Encounter for palliative care: Secondary | ICD-10-CM

## 2023-03-27 MED ORDER — XTAMPZA ER 18 MG PO C12A
18.0000 mg | EXTENDED_RELEASE_CAPSULE | Freq: Three times a day (TID) | ORAL | 0 refills | Status: DC
Start: 2023-03-27 — End: 2023-04-29

## 2023-03-27 NOTE — Telephone Encounter (Signed)
 Pt daughter called for medication refill, see associated orders.

## 2023-04-17 ENCOUNTER — Inpatient Hospital Stay: Admitting: Nurse Practitioner

## 2023-04-18 ENCOUNTER — Inpatient Hospital Stay: Attending: Nurse Practitioner | Admitting: Nurse Practitioner

## 2023-04-18 ENCOUNTER — Encounter: Payer: Self-pay | Admitting: Nurse Practitioner

## 2023-04-18 DIAGNOSIS — C3490 Malignant neoplasm of unspecified part of unspecified bronchus or lung: Secondary | ICD-10-CM

## 2023-04-18 DIAGNOSIS — R110 Nausea: Secondary | ICD-10-CM | POA: Diagnosis not present

## 2023-04-18 DIAGNOSIS — G893 Neoplasm related pain (acute) (chronic): Secondary | ICD-10-CM

## 2023-04-18 DIAGNOSIS — Z515 Encounter for palliative care: Secondary | ICD-10-CM | POA: Diagnosis not present

## 2023-04-18 MED ORDER — ONDANSETRON HCL 8 MG PO TABS: 8.0000 mg | ORAL_TABLET | Freq: Three times a day (TID) | ORAL | 3 refills | Status: AC | PRN

## 2023-04-18 MED ORDER — PROCHLORPERAZINE MALEATE 10 MG PO TABS: 10.0000 mg | ORAL_TABLET | Freq: Four times a day (QID) | ORAL | 3 refills | Status: DC | PRN

## 2023-04-18 NOTE — Progress Notes (Signed)
 Palliative Medicine [facility]  Telephone:(336) [phone] Fax:(336) [phone]   Name: Danielle [name] Date: 04/18/2023 MRN: [id number]  DOB: [birth year]  Patient Care Team: Excell Seltzer, MD as PCP - General Si Gaul, MD as Consulting Physician (Oncology) Vilinda Flake, [facility] (Inactive) as Pharmacist (Pharmacist) Pickenpack-Cousar, Arty Baumgartner, NP as Nurse Practitioner (Nurse Practitioner) [facility], [facility] [place], [place]   I connected with Harvie Bridge Vencill on 04/18/23 at  9:00 AM EDT by phone and verified that I am speaking with the correct person using two identifiers.   I discussed the limitations, risks, security and privacy concerns of performing an evaluation and management service by telemedicine and the availability of in-person appointments. I also discussed with the patient that there may be a patient responsible charge related to this service. The patient expressed understanding and agreed to proceed.   Other persons participating in the visit and their role in the encounter: n/a   Patient's location: home  Provider's location: [facility]   Chief Complaint: f/u of symptom management    INTERVAL HISTORY: Danielle [name] is a 73 y.o. 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 Past medical history also includes COPD, diabetes mellitus, insomnia, anemia, as well as a cerebral infarct(10/2014). Palliative ask to see for symptom and pain management and goals of care.   SOCIAL HISTORY:     reports that she quit smoking about 17 years ago. Her smoking use included cigarettes. She started smoking about 47 years ago. She has a 60 pack-year smoking history. She has never used smokeless tobacco. She reports that she does not drink alcohol and does not use drugs.  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 had chemo and radiation for lung ca   Thrush 12/11/2010    ALLERGIES:  is allergic to contrast media [iodinated contrast media], iohexol, sulfa drugs cross reactors, and co-trimoxazole injection [sulfamethoxazole-trimethoprim].  MEDICATIONS:  Current Outpatient Medications  Medication Sig Dispense Refill   albuterol (VENTOLIN HFA) 108 (90 Base) MCG/ACT inhaler TAKE 2 PUFFS BY MOUTH EVERY 6 HOURS AS NEEDED FOR WHEEZE OR SHORTNESS OF BREATH 18 each 5   alendronate (FOSAMAX) 70 MG tablet TAKE 1 TABLET BY MOUTH EVERY 7 (SEVEN) DAYS. TAKE WITH A FULL GLASS OF WATER ON AN EMPTY STOMACH. 12 tablet 1   amLODipine (NORVASC) 10 MG tablet TAKE 1 TABLET BY MOUTH EVERY DAY 90 tablet 1   aspirin 325 MG EC tablet TAKE 1 TABLET BY MOUTH EVERY DAY 90 tablet 0   atorvastatin (LIPITOR) 40 MG tablet TAKE 1 TABLET BY MOUTH EVERY DAY AT 6 PM 90 tablet 0   Cholecalciferol (VITAMIN D3) 50 MCG (2000 UT) TABS Take by mouth.     diphenhydrAMINE (BENADRYL)  25 MG tablet Take 1 tablet (25 mg total) by mouth as directed. 2 hours before CT scan. 5 tablet 0   FeFum-FePoly-FA-B Cmp-C-Biot (FOLIVANE-PLUS) CAPS TAKE 1 CAPSULE BY MOUTH EVERY DAY IN THE MORNING 90 capsule 3   fluticasone (FLONASE) 50 MCG/ACT nasal spray Place 1-2 sprays into both nostrils daily as needed. 16 g 5   furosemide (LASIX) 20 MG tablet TAKE 1 TABLET BY MOUTH EVERY DAY AS NEEDED FOR SWELLING 20 tablet 1   INCRUSE ELLIPTA 62.5  MCG/ACT AEPB INHALE 1 PUFF BY MOUTH EVERY DAY 30 each 5   lidocaine-prilocaine (EMLA) cream APPLY TO AFFECTED AREA FOR 1 DOSE 30 g 2   lidocaine-prilocaine (EMLA) cream Apply 1 Application topically once.     lisinopril (ZESTRIL) 20 MG tablet TAKE 2 TABLETS (40 MG TOTAL) BY MOUTH DAILY. 180 tablet 0   morphine (MSIR) 30 MG tablet Take 1 tablet (30 mg total) by mouth every 4 (four) hours as needed for severe pain (pain score 7-10). 90 tablet 0   Olopatadine HCl 0.2 % SOLN INSTILL 1 DROP INTO AFFECTED EYE(S) EVERY DAY 2.5 mL 1   ondansetron (ZOFRAN) 8 MG tablet Take 1 tablet (8 mg total) by mouth every 8 (eight) hours as needed for nausea or vomiting. 30 tablet 3   oxyCODONE ER (XTAMPZA ER) 18 MG C12A Take 18 mg by mouth every 8 (eight) hours. 90 capsule 0   predniSONE (DELTASONE) 50 MG tablet 1 tablet by mouth 13 hours ,7 hours and 1 hour  before CT scan 3 tablet 12   prochlorperazine (COMPAZINE) 10 MG tablet Take 1 tablet (10 mg total) by mouth every 6 (six) hours as needed. 30 tablet 3   triamcinolone (KENALOG) 0.025 % ointment APPLY TO AFFECTED AREA TWICE A DAY 30 g 0   No current facility-administered medications for this visit.    VITAL SIGNS: There were no vitals taken for this visit. There were no vitals filed for this visit.  Estimated body mass index is 25 kg/m as calculated from the following:   Height as of 02/21/23: 5' 2.75" (1.594 m).   Weight as of 02/21/23: 140 lb (63.5 kg).   PERFORMANCE STATUS (ECOG) : 1 - Symptomatic but completely ambulatory   IMPRESSION:  I connected with [name] via phone for follow-up. No acute distress. Denies any concerns with uncontrolled nausea, vomiting, constipation, or diarrhea. Appetite is good. She is remaining as active as possible.   [name] reports her pain is well controlled on current regimen. She is tolerating Xtampza 18mg  every 8 hours and MS IR as needed for breakthrough pain.  No changes to her regimen at this time.   I  discussed the importance of continued conversation with family and their medical providers regarding overall plan of care and treatment options, ensuring decisions are within the context of the patients values and GOCs.  PLAN:  Cancer Related Pain Stable on Xtampza. No issues with constipation or diarrhea. No need for antiemetics at this time. -Continue Xtampza 18mg  every 8 hours as prescribed. -Continu MS IR 30mg  every 4 hours as needed for breakthrough pain  Intermittent Increased Pain with Activity Reports increased back pain with increased walking. Uses Morphine sparingly for these instances. -[facility] use as needed.  Follow-up Patient to be seen in 6-8 weeks via telephone.  Sent refills for antiemetics as requested.   Patient expressed understanding and was in agreement with this plan. She also understands  that She can call the clinic at any time with any questions, concerns, or complaints.   Any controlled substances utilized were prescribed in the context of palliative care. PDMP has been reviewed.   I provided 15 minutes of non face-to-face telephone visit time during this encounter. Visit consisted of counseling and education dealing with the complex and emotionally intense issues of symptom management and palliative care in the setting of serious and potentially life-threatening illness.  Willette Alma, AGPCNP-BC  Palliative Medicine Team/[facility]

## 2023-04-29 ENCOUNTER — Other Ambulatory Visit: Payer: Self-pay

## 2023-04-29 DIAGNOSIS — C3490 Malignant neoplasm of unspecified part of unspecified bronchus or lung: Secondary | ICD-10-CM

## 2023-04-29 DIAGNOSIS — Z515 Encounter for palliative care: Secondary | ICD-10-CM

## 2023-04-29 DIAGNOSIS — C3410 Malignant neoplasm of upper lobe, unspecified bronchus or lung: Secondary | ICD-10-CM

## 2023-04-29 DIAGNOSIS — G893 Neoplasm related pain (acute) (chronic): Secondary | ICD-10-CM

## 2023-04-29 MED ORDER — XTAMPZA ER 18 MG PO C12A: 18.0000 mg | EXTENDED_RELEASE_CAPSULE | Freq: Three times a day (TID) | ORAL | 0 refills | Status: DC

## 2023-04-29 MED ORDER — MORPHINE SULFATE 30 MG PO TABS: 30.0000 mg | ORAL_TABLET | ORAL | 0 refills | Status: DC | PRN

## 2023-04-29 NOTE — Telephone Encounter (Signed)
 Pt daughter called for medication refill, see associated orders.

## 2023-05-02 ENCOUNTER — Telehealth: Payer: Self-pay | Admitting: Internal Medicine

## 2023-05-02 NOTE — Telephone Encounter (Signed)
 Yamina's daughter called in and left a voicemail to schedule Treva's scan. I informed Kandi to contact me at my direct line as I am covering for [place] today.

## 2023-05-23 ENCOUNTER — Other Ambulatory Visit: Payer: Self-pay | Admitting: Family Medicine

## 2023-05-23 NOTE — Telephone Encounter (Signed)
 Called  pt and schedule a appt for cpe / labs

## 2023-05-23 NOTE — Telephone Encounter (Signed)
 Please schedule CPE with fasting labs prior with Dr. Cherlyn Cornet if patient is able.

## 2023-05-29 ENCOUNTER — Encounter: Payer: Self-pay | Admitting: Nurse Practitioner

## 2023-05-29 ENCOUNTER — Inpatient Hospital Stay: Attending: Nurse Practitioner | Admitting: Nurse Practitioner

## 2023-05-29 DIAGNOSIS — C3410 Malignant neoplasm of upper lobe, unspecified bronchus or lung: Secondary | ICD-10-CM | POA: Diagnosis not present

## 2023-05-29 DIAGNOSIS — C3490 Malignant neoplasm of unspecified part of unspecified bronchus or lung: Secondary | ICD-10-CM | POA: Diagnosis not present

## 2023-05-29 DIAGNOSIS — Z515 Encounter for palliative care: Secondary | ICD-10-CM | POA: Diagnosis not present

## 2023-05-29 DIAGNOSIS — G893 Neoplasm related pain (acute) (chronic): Secondary | ICD-10-CM | POA: Diagnosis not present

## 2023-05-29 MED ORDER — XTAMPZA ER 18 MG PO C12A: 18.0000 mg | EXTENDED_RELEASE_CAPSULE | Freq: Three times a day (TID) | ORAL | 0 refills | Status: DC

## 2023-05-29 MED ORDER — MORPHINE SULFATE 30 MG PO TABS
30.0000 mg | ORAL_TABLET | ORAL | 0 refills | Status: DC | PRN
Start: 2023-05-29 — End: 2023-06-30

## 2023-05-29 NOTE — Progress Notes (Signed)
 Palliative Medicine [name]  Telephone:(336) [phone] Fax:(336) [phone]   Name: Danielle [name] Date: 05/29/2023 MRN: [id number]  DOB: [birth year]  Patient Care Team: Judithann Novas, MD as PCP - General Marlene Simas, MD as Consulting Physician (Oncology) Alta Ast, [facility] (Inactive) as Pharmacist (Pharmacist) Pickenpack-Cousar, Giles Labrum, NP as Nurse Practitioner (Nurse Practitioner) [facility], [facility] [place], [place]   I connected with Reeta Canon Calabria on 05/29/23 at 11:30 AM EDT by phone and verified that I am speaking with the correct person using two identifiers.   I discussed the limitations, risks, security and privacy concerns of performing an evaluation and management service by telemedicine and the availability of in-person appointments. I also discussed with the patient that there may be a patient responsible charge related to this service. The patient expressed understanding and agreed to proceed.   Other persons participating in the visit and their role in the encounter: n/a   Patient's location: home  Provider's location: [facility]   Chief Complaint: f/u of symptom management    INTERVAL HISTORY: Danielle [name] is a 73 y.o. 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 Past medical history also includes COPD, diabetes mellitus, insomnia, anemia, as well as a cerebral infarct(10/2014). Palliative ask to see for symptom and pain management and goals of care.   SOCIAL HISTORY:     reports that she quit smoking about 17 years ago. Her smoking use included cigarettes. She started smoking about 47 years ago. She has a 60 pack-year smoking history. She has never used smokeless tobacco. She reports that she does not drink alcohol and does not use drugs.  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 had chemo and radiation for lung ca   Thrush 12/11/2010    ALLERGIES:  is allergic to contrast media [iodinated contrast media], iohexol , sulfa drugs cross reactors, and co-trimoxazole injection [sulfamethoxazole-trimethoprim].  MEDICATIONS:  Current Outpatient Medications  Medication Sig Dispense Refill   albuterol  (VENTOLIN  HFA) 108 (90 Base) MCG/ACT inhaler TAKE 2 PUFFS BY MOUTH EVERY 6 HOURS AS NEEDED FOR WHEEZE OR SHORTNESS OF BREATH 18 each 5   alendronate  (FOSAMAX ) 70 MG tablet TAKE 1 TABLET BY MOUTH EVERY 7 (SEVEN) DAYS. TAKE WITH A FULL GLASS OF WATER ON AN EMPTY STOMACH. 12 tablet 1   amLODipine  (NORVASC ) 10 MG tablet TAKE 1 TABLET BY MOUTH EVERY DAY 90 tablet 1   aspirin  325 MG EC tablet TAKE 1 TABLET BY MOUTH EVERY DAY 90 tablet 0   atorvastatin  (LIPITOR) 40 MG tablet TAKE 1 TABLET BY MOUTH EVERY DAY AT 6 PM 90 tablet 0   Cholecalciferol (VITAMIN D3) 50 MCG (2000 UT) TABS Take by mouth.     diphenhydrAMINE  (BENADRYL ) 25  MG tablet Take 1 tablet (25 mg total) by mouth as directed. 2 hours before CT scan. 5 tablet 0   FeFum-FePoly-FA-B Cmp-C-Biot (FOLIVANE-PLUS) CAPS TAKE 1 CAPSULE BY MOUTH EVERY DAY IN THE MORNING 90 capsule 3   fluticasone  (FLONASE ) 50 MCG/ACT nasal spray Place 1-2 sprays into both nostrils daily as needed. 16 g 5   furosemide  (LASIX ) 20 MG tablet TAKE 1 TABLET BY MOUTH EVERY DAY AS NEEDED FOR SWELLING 20 tablet 1   INCRUSE ELLIPTA  62.5  MCG/ACT AEPB INHALE 1 PUFF BY MOUTH EVERY DAY 30 each 5   lidocaine -prilocaine  (EMLA ) cream APPLY TO AFFECTED AREA FOR 1 DOSE 30 g 2   lidocaine -prilocaine  (EMLA ) cream Apply 1 Application topically once.     lisinopril  (ZESTRIL ) 20 MG tablet TAKE 2 TABLETS (40 MG TOTAL) BY MOUTH DAILY. 180 tablet 0   morphine  (MSIR) 30 MG tablet Take 1 tablet (30 mg total) by mouth every 4 (four) hours as needed for severe pain (pain score 7-10). 90 tablet 0   Olopatadine  HCl 0.2 % SOLN INSTILL 1 DROP INTO AFFECTED EYE(S) EVERY DAY 2.5 mL 1   ondansetron  (ZOFRAN ) 8 MG tablet Take 1 tablet (8 mg total) by mouth every 8 (eight) hours as needed for nausea or vomiting. 30 tablet 3   oxyCODONE  ER (XTAMPZA  ER) 18 MG C12A Take 18 mg by mouth every 8 (eight) hours. 90 capsule 0   predniSONE  (DELTASONE ) 50 MG tablet 1 tablet by mouth 13 hours ,7 hours and 1 hour  before CT scan 3 tablet 12   prochlorperazine  (COMPAZINE ) 10 MG tablet Take 1 tablet (10 mg total) by mouth every 6 (six) hours as needed. 30 tablet 3   triamcinolone  (KENALOG ) 0.025 % ointment APPLY TO AFFECTED AREA TWICE A DAY 30 g 0   No current facility-administered medications for this visit.    VITAL SIGNS: There were no vitals taken for this visit. There were no vitals filed for this visit.  Estimated body mass index is 25 kg/m as calculated from the following:   Height as of 02/21/23: 5' 2.75" (1.594 m).   Weight as of 02/21/23: 140 lb (63.5 kg).   PERFORMANCE STATUS (ECOG) : 1 - Symptomatic but completely ambulatory   IMPRESSION:  I connected with Mrs. Pons via phone for follow-up. No acute distress. Denies any concerns with uncontrolled nausea, vomiting, constipation, or diarrhea. Appetite is good. She is remaining as active as possible.   [name] reports her pain is well controlled on current regimen. She is tolerating Xtampza  18mg  every 8 hours and MS IR as needed for breakthrough pain.  No changes to her regimen at this time.   I  discussed the importance of continued conversation with family and their medical providers regarding overall plan of care and treatment options, ensuring decisions are within the context of the patients values and GOCs.  PLAN:  Cancer Related Pain Stable on Xtampza . No issues with constipation or diarrhea. No need for antiemetics at this time. -Continue Xtampza  18mg  every 8 hours as prescribed. -Continu MS IR 30mg  every 4 hours as needed for breakthrough pain  Intermittent Increased Pain with Activity Reports increased back pain with increased walking. Uses Morphine  sparingly for these instances. -Refill Morphine  [facility] for use as needed.  Follow-up Patient to be seen in 6-8 weeks via telephone.  Sent refills for antiemetics as requested.   Patient expressed understanding and was in agreement with this plan. She also understands that  She can call the clinic at any time with any questions, concerns, or complaints.   Any controlled substances utilized were prescribed in the context of palliative care. PDMP has been reviewed.   I provided  20 minutes of non face-to-face telephone visit time during this encounter. Visit consisted of counseling and education dealing with the complex and emotionally intense issues of symptom management and palliative care in the setting of serious and potentially life-threatening illness.  Dellia Ferguson, AGPCNP-BC  Palliative Medicine Team/[facility]

## 2023-05-31 ENCOUNTER — Other Ambulatory Visit: Payer: Self-pay | Admitting: Family Medicine

## 2023-06-11 ENCOUNTER — Inpatient Hospital Stay: Admitting: Nurse Practitioner

## 2023-06-30 ENCOUNTER — Other Ambulatory Visit: Payer: Self-pay

## 2023-06-30 DIAGNOSIS — C3410 Malignant neoplasm of upper lobe, unspecified bronchus or lung: Secondary | ICD-10-CM

## 2023-06-30 DIAGNOSIS — G893 Neoplasm related pain (acute) (chronic): Secondary | ICD-10-CM

## 2023-06-30 DIAGNOSIS — Z515 Encounter for palliative care: Secondary | ICD-10-CM

## 2023-06-30 DIAGNOSIS — C3490 Malignant neoplasm of unspecified part of unspecified bronchus or lung: Secondary | ICD-10-CM

## 2023-06-30 MED ORDER — MORPHINE SULFATE 30 MG PO TABS: 30.0000 mg | ORAL_TABLET | ORAL | 0 refills | Status: DC | PRN

## 2023-06-30 MED ORDER — XTAMPZA ER 18 MG PO C12A: 18.0000 mg | EXTENDED_RELEASE_CAPSULE | Freq: Three times a day (TID) | ORAL | 0 refills | Status: DC

## 2023-07-09 ENCOUNTER — Inpatient Hospital Stay: Attending: Nurse Practitioner | Admitting: Nurse Practitioner

## 2023-07-09 DIAGNOSIS — K5903 Drug induced constipation: Secondary | ICD-10-CM | POA: Diagnosis not present

## 2023-07-09 DIAGNOSIS — Z515 Encounter for palliative care: Secondary | ICD-10-CM | POA: Diagnosis not present

## 2023-07-09 DIAGNOSIS — G893 Neoplasm related pain (acute) (chronic): Secondary | ICD-10-CM | POA: Diagnosis not present

## 2023-07-09 DIAGNOSIS — C3490 Malignant neoplasm of unspecified part of unspecified bronchus or lung: Secondary | ICD-10-CM | POA: Diagnosis not present

## 2023-07-10 ENCOUNTER — Telehealth: Payer: Self-pay | Admitting: *Deleted

## 2023-07-10 ENCOUNTER — Encounter: Payer: Self-pay | Admitting: Internal Medicine

## 2023-07-10 ENCOUNTER — Encounter: Payer: Self-pay | Admitting: Nurse Practitioner

## 2023-07-10 DIAGNOSIS — E1169 Type 2 diabetes mellitus with other specified complication: Secondary | ICD-10-CM

## 2023-07-10 DIAGNOSIS — M810 Age-related osteoporosis without current pathological fracture: Secondary | ICD-10-CM

## 2023-07-10 DIAGNOSIS — C3411 Malignant neoplasm of upper lobe, right bronchus or lung: Secondary | ICD-10-CM

## 2023-07-10 DIAGNOSIS — E119 Type 2 diabetes mellitus without complications: Secondary | ICD-10-CM

## 2023-07-10 NOTE — Telephone Encounter (Signed)
-----   Message from Harlene Du sent at 07/10/2023 11:01 AM EDT ----- Regarding: Labs Wed 08/06/23 Hello,  Patient is coming in for CPE labs on Wednesday 08/06/23. Can we get orders please.   Thanks ----- Message ----- From: Du Harlene Sent: 07/10/2023  11:01 AM EDT To: Greig FORBES Ring, MD Subject: Labs Wed 08/06/23                               Hello,  Patient is coming in for CPE labs on Wednesday 08/06/23. Can we get orders please.   Thanks

## 2023-07-10 NOTE — Progress Notes (Signed)
 Palliative Medicine [facility]  Telephone:(336) [phone] Fax:(336) [phone]   Name: Danielle [name] Date: 07/10/2023 MRN: [id number]  DOB: 1950-09-10  Patient Care Team: Danielle Greig BRAVO, MD as PCP - General Danielle Sherrod, MD as Consulting Physician (Oncology) Danielle [name], [facility] (Inactive) as Pharmacist (Pharmacist) Pickenpack-Cousar, Danielle SAILOR, NP as Nurse Practitioner (Nurse Practitioner) [facility], [facility] [place], [place]   I connected with Danielle [name] on 07/10/23 at  4:00 PM EDT by phone and verified that I am speaking with the correct person using two identifiers.   I discussed the limitations, risks, security and privacy concerns of performing an evaluation and management service by telemedicine and the availability of in-person appointments. I also discussed with the patient that there may be a patient responsible charge related to this service. The patient expressed understanding and agreed to proceed.   Other persons participating in the visit and their role in the encounter: n/a   Patient's location: home  Provider's location[name]   Chief Complaint: f/u of symptom management    INTERVAL HISTORY: Danielle [name] is a 73 y.o. 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 Past medical history also includes COPD, diabetes mellitus, insomnia, anemia, as well as a cerebral infarct(10/2014). Palliative ask to see for symptom and pain management and goals of care.   SOCIAL HISTORY:     reports that she quit smoking about 17 years ago. Her smoking use included cigarettes. She started smoking about 47 years ago. She has a 60 pack-year smoking history. She has never used smokeless tobacco. She reports that she does not drink alcohol and does not use drugs.  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 had chemo and radiation for lung ca   Thrush 12/11/2010    ALLERGIES:  is allergic to contrast media [iodinated contrast media], iohexol , sulfa drugs cross reactors, and co-trimoxazole injection [sulfamethoxazole-trimethoprim].  MEDICATIONS:  Current Outpatient Medications  Medication Sig Dispense Refill   albuterol  (VENTOLIN  HFA) 108 (90 Base) MCG/ACT inhaler TAKE 2 PUFFS BY MOUTH EVERY 6 HOURS AS NEEDED FOR WHEEZE OR SHORTNESS OF BREATH 18 each 5   alendronate  (FOSAMAX ) 70 MG tablet TAKE 1 TABLET BY MOUTH EVERY 7 (SEVEN) DAYS. TAKE WITH A FULL GLASS OF WATER ON AN EMPTY STOMACH. 12 tablet 1   amLODipine  (NORVASC ) 10 MG tablet TAKE 1 TABLET BY MOUTH EVERY DAY 90 tablet 1   aspirin  325 MG EC tablet TAKE 1 TABLET BY MOUTH EVERY DAY 90 tablet 0   atorvastatin  (LIPITOR) 40 MG tablet TAKE 1 TABLET BY MOUTH EVERY DAY AT 6 PM 90 tablet 0   Cholecalciferol (VITAMIN D3) 50 MCG (2000 UT) TABS Take by mouth.     diphenhydrAMINE  (BENADRYL )  25 MG tablet Take 1 tablet (25 mg total) by mouth as directed. 2 hours before CT scan. 5 tablet 0   FeFum-FePoly-FA-B Cmp-C-Biot (FOLIVANE-PLUS) CAPS TAKE 1 CAPSULE BY MOUTH EVERY DAY IN THE MORNING 90 capsule 3   fluticasone  (FLONASE ) 50 MCG/ACT nasal spray Place 1-2 sprays into both nostrils daily as needed. 16 g 5   furosemide  (LASIX ) 20 MG tablet TAKE 1 TABLET BY MOUTH EVERY DAY AS NEEDED FOR SWELLING 20 tablet 1   INCRUSE ELLIPTA  62.5  MCG/ACT AEPB INHALE 1 PUFF BY MOUTH EVERY DAY 30 each 5   lidocaine -prilocaine  (EMLA ) cream APPLY TO AFFECTED AREA FOR 1 DOSE 30 g 2   lidocaine -prilocaine  (EMLA ) cream Apply 1 Application topically once.     lisinopril  (ZESTRIL ) 20 MG tablet TAKE 2 TABLETS (40 MG TOTAL) BY MOUTH DAILY. 180 tablet 0   morphine  (MSIR) 30 MG tablet Take 1 tablet (30 mg total) by mouth every 4 (four) hours as needed for severe pain (pain score 7-10). 90 tablet 0   Olopatadine  HCl 0.2 % SOLN INSTILL 1 DROP INTO AFFECTED EYE(S) EVERY DAY 2.5 mL 1   ondansetron  (ZOFRAN ) 8 MG tablet Take 1 tablet (8 mg total) by mouth every 8 (eight) hours as needed for nausea or vomiting. 30 tablet 3   oxyCODONE  ER (XTAMPZA  ER) 18 MG C12A Take 18 mg by mouth every 8 (eight) hours. 90 capsule 0   predniSONE  (DELTASONE ) 50 MG tablet 1 tablet by mouth 13 hours ,7 hours and 1 hour  before CT scan 3 tablet 12   prochlorperazine  (COMPAZINE ) 10 MG tablet Take 1 tablet (10 mg total) by mouth every 6 (six) hours as needed. 30 tablet 3   triamcinolone  (KENALOG ) 0.025 % ointment APPLY TO AFFECTED AREA TWICE A DAY 30 g 0   No current facility-administered medications for this visit.    VITAL SIGNS: There were no vitals taken for this visit. There were no vitals filed for this visit.  Estimated body mass index is 25 kg/m as calculated from the following:   Height as of 02/21/23: 5' 2.75 (1.594 m).   Weight as of 02/21/23: 140 lb (63.5 kg).   PERFORMANCE STATUS (ECOG) : 1 - Symptomatic but completely ambulatory   IMPRESSION:  I connected with Danielle [name] via phone for follow-up. No acute distress. Denies any concerns with uncontrolled nausea, vomiting, constipation, or diarrhea. Appetite is good. She is remaining as active as possible.   Danielle [name] reports her pain is well controlled on current regimen. She is tolerating Xtampza  18mg  every 8 hours and MS IR as needed for breakthrough pain. Does not require breakthrough around the clock. No  adjustments to regimen at this time.   All questions answered and support provided.   PLAN:  Cancer Related Pain Stable on Xtampza . No issues with constipation or diarrhea. No need for antiemetics at this time. -Continue Xtampza  18mg  every 8 hours as prescribed. -Continu MS IR 30mg  every 4 hours as needed for breakthrough pain  Follow-up Patient to be seen in 6-8 weeks via telephone.   Patient expressed understanding and was in agreement with this plan. She also understands that She can call the clinic at any time with any questions, concerns, or complaints.   Any controlled substances utilized were prescribed in the context of palliative care. PDMP has been reviewed.   I provided  20 minutes of non face-to-face telephone visit time during this encounter. Visit consisted of counseling and education dealing with the complex and  emotionally intense issues of symptom management and palliative care in the setting of serious and potentially life-threatening illness.  Levon Borer, AGPCNP-BC  Palliative Medicine Team/[facility]

## 2023-07-31 ENCOUNTER — Other Ambulatory Visit: Payer: Self-pay

## 2023-07-31 DIAGNOSIS — C3490 Malignant neoplasm of unspecified part of unspecified bronchus or lung: Secondary | ICD-10-CM

## 2023-07-31 DIAGNOSIS — G893 Neoplasm related pain (acute) (chronic): Secondary | ICD-10-CM

## 2023-07-31 DIAGNOSIS — C3410 Malignant neoplasm of upper lobe, unspecified bronchus or lung: Secondary | ICD-10-CM

## 2023-07-31 DIAGNOSIS — Z515 Encounter for palliative care: Secondary | ICD-10-CM

## 2023-07-31 MED ORDER — XTAMPZA ER 18 MG PO C12A: 18.0000 mg | EXTENDED_RELEASE_CAPSULE | Freq: Three times a day (TID) | ORAL | 0 refills | Status: DC

## 2023-07-31 NOTE — Telephone Encounter (Signed)
 Pt daughter called on her behalf for medication refill. See associated orders.

## 2023-08-06 ENCOUNTER — Other Ambulatory Visit (INDEPENDENT_AMBULATORY_CARE_PROVIDER_SITE_OTHER)

## 2023-08-06 ENCOUNTER — Ambulatory Visit: Payer: Self-pay | Admitting: Family Medicine

## 2023-08-06 DIAGNOSIS — E1169 Type 2 diabetes mellitus with other specified complication: Secondary | ICD-10-CM | POA: Diagnosis not present

## 2023-08-06 DIAGNOSIS — M810 Age-related osteoporosis without current pathological fracture: Secondary | ICD-10-CM

## 2023-08-06 DIAGNOSIS — E119 Type 2 diabetes mellitus without complications: Secondary | ICD-10-CM | POA: Diagnosis not present

## 2023-08-06 DIAGNOSIS — E785 Hyperlipidemia, unspecified: Secondary | ICD-10-CM

## 2023-08-06 DIAGNOSIS — C3411 Malignant neoplasm of upper lobe, right bronchus or lung: Secondary | ICD-10-CM | POA: Diagnosis not present

## 2023-08-06 LAB — CBC WITH DIFFERENTIAL/PLATELET
Basophils Absolute: 0.1 K/uL (ref 0.0–0.1)
Basophils Relative: 0.5 % (ref 0.0–3.0)
Eosinophils Absolute: 0 K/uL (ref 0.0–0.7)
Eosinophils Relative: 0.4 % (ref 0.0–5.0)
HCT: 42.4 % (ref 36.0–46.0)
Hemoglobin: 13.8 g/dL (ref 12.0–15.0)
Lymphocytes Relative: 17.3 % (ref 12.0–46.0)
Lymphs Abs: 2 K/uL (ref 0.7–4.0)
MCHC: 32.7 g/dL (ref 30.0–36.0)
MCV: 90.4 fl (ref 78.0–100.0)
Monocytes Absolute: 0.7 K/uL (ref 0.1–1.0)
Monocytes Relative: 6 % (ref 3.0–12.0)
Neutro Abs: 8.9 K/uL — ABNORMAL HIGH (ref 1.4–7.7)
Neutrophils Relative %: 75.8 % (ref 43.0–77.0)
Platelets: 454 K/uL — ABNORMAL HIGH (ref 150.0–400.0)
RBC: 4.69 Mil/uL (ref 3.87–5.11)
RDW: 13.1 % (ref 11.5–15.5)
WBC: 11.8 K/uL — ABNORMAL HIGH (ref 4.0–10.5)

## 2023-08-06 LAB — COMPREHENSIVE METABOLIC PANEL WITH GFR
ALT: 9 U/L (ref 0–35)
AST: 16 U/L (ref 0–37)
Albumin: 4.2 g/dL (ref 3.5–5.2)
Alkaline Phosphatase: 85 U/L (ref 39–117)
BUN: 15 mg/dL (ref 6–23)
CO2: 28 meq/L (ref 19–32)
Calcium: 10.1 mg/dL (ref 8.4–10.5)
Chloride: 103 meq/L (ref 96–112)
Creatinine, Ser: 0.8 mg/dL (ref 0.40–1.20)
GFR: 74.33 mL/min (ref >60.00)
Glucose, Bld: 137 mg/dL — ABNORMAL HIGH (ref 70–99)
Potassium: 4.3 meq/L (ref 3.5–5.1)
Sodium: 141 meq/L (ref 135–145)
Total Bilirubin: 0.6 mg/dL (ref 0.2–1.2)
Total Protein: 7 g/dL (ref 6.0–8.3)

## 2023-08-06 LAB — LIPID PANEL
Cholesterol: 168 mg/dL (ref 0–200)
HDL: 54.9 mg/dL (ref >39.00)
LDL Cholesterol: 93 mg/dL (ref 0–99)
NonHDL: 112.8
Total CHOL/HDL Ratio: 3
Triglycerides: 99 mg/dL (ref 0.0–149.0)
VLDL: 19.8 mg/dL (ref 0.0–40.0)

## 2023-08-06 LAB — VITAMIN D 25 HYDROXY (VIT D DEFICIENCY, FRACTURES): VITD: 22.17 ng/mL — ABNORMAL LOW (ref 30.00–100.00)

## 2023-08-06 LAB — HEMOGLOBIN A1C: Hgb A1c MFr Bld: 5.7 % (ref 4.6–6.5)

## 2023-08-06 NOTE — Progress Notes (Signed)
 No critical labs need to be addressed urgently. We will discuss labs in detail at upcoming office visit.

## 2023-08-06 NOTE — Addendum Note (Signed)
 Addended by: HOPE VEVA PARAS on: 08/06/2023 08:36 AM   Modules accepted: Orders

## 2023-08-12 ENCOUNTER — Other Ambulatory Visit: Payer: Self-pay | Admitting: Family Medicine

## 2023-08-13 ENCOUNTER — Encounter: Admitting: Family Medicine

## 2023-08-13 ENCOUNTER — Ambulatory Visit: Payer: Self-pay | Admitting: Family Medicine

## 2023-08-13 LAB — MICROALBUMIN / CREATININE URINE RATIO
Creatinine,U: 21.7 mg/dL
Microalb Creat Ratio: UNDETERMINED mg/g (ref 0.0–30.0)
Microalb, Ur: <0.7 mg/dL

## 2023-08-16 ENCOUNTER — Other Ambulatory Visit: Payer: Self-pay | Admitting: Family Medicine

## 2023-08-19 ENCOUNTER — Encounter: Payer: Self-pay | Admitting: Family Medicine

## 2023-08-19 ENCOUNTER — Ambulatory Visit: Admitting: Family Medicine

## 2023-08-19 VITALS — BP 152/60 | HR 91 | Temp 97.8°F | Ht 62.25 in | Wt 146.4 lb

## 2023-08-19 DIAGNOSIS — Z0000 Encounter for general adult medical examination without abnormal findings: Secondary | ICD-10-CM

## 2023-08-19 DIAGNOSIS — E1159 Type 2 diabetes mellitus with other circulatory complications: Secondary | ICD-10-CM

## 2023-08-19 DIAGNOSIS — E785 Hyperlipidemia, unspecified: Secondary | ICD-10-CM

## 2023-08-19 DIAGNOSIS — J449 Chronic obstructive pulmonary disease, unspecified: Secondary | ICD-10-CM | POA: Diagnosis not present

## 2023-08-19 DIAGNOSIS — I152 Hypertension secondary to endocrine disorders: Secondary | ICD-10-CM

## 2023-08-19 DIAGNOSIS — E559 Vitamin D deficiency, unspecified: Secondary | ICD-10-CM | POA: Diagnosis not present

## 2023-08-19 DIAGNOSIS — C3411 Malignant neoplasm of upper lobe, right bronchus or lung: Secondary | ICD-10-CM

## 2023-08-19 DIAGNOSIS — C3490 Malignant neoplasm of unspecified part of unspecified bronchus or lung: Secondary | ICD-10-CM

## 2023-08-19 DIAGNOSIS — E119 Type 2 diabetes mellitus without complications: Secondary | ICD-10-CM

## 2023-08-19 DIAGNOSIS — E1169 Type 2 diabetes mellitus with other specified complication: Secondary | ICD-10-CM

## 2023-08-19 MED ORDER — VITAMIN D (ERGOCALCIFEROL) 1.25 MG (50000 UNIT) PO CAPS
50000.0000 [IU] | ORAL_CAPSULE | ORAL | 1 refills | Status: AC
Start: 2023-08-19 — End: ?

## 2023-08-19 NOTE — Assessment & Plan Note (Signed)
 Followed closely by oncology.  Resolved

## 2023-08-19 NOTE — Assessment & Plan Note (Signed)
 Chronic, replace with weekly vit D. Pt would like to continue weekly long term to remember.

## 2023-08-19 NOTE — Progress Notes (Signed)
 Patient ID: Danielle [name], female    DOB: [birth year], 73 y.o.   MRN: [id number]  This visit was conducted in person.  BP (!) 160/70   Pulse 91   Temp 97.8 F (36.6 C) (Temporal)   Ht 5' 2.25 (1.581 m)   Wt 146 lb 6 oz (66.4 kg)   SpO2 98%   BMI 26.56 kg/m    CC:  Chief Complaint  Patient presents with   Annual Exam    Part 2 (MWV 02/21/23)    Subjective:   HPI: Danielle [name] is a 73 y.o. female presenting on 08/19/2023 for Annual Exam (Part 2 (MWV 02/21/23))  The patient saw a LPN or RN for medicare wellness visit.  Prevention and wellness was reviewed in detail. Note reviewed and important notes copied below as needed.  Diabetes:  Diet controlled. Lab Results  Component Value Date   HGBA1C 5.7 08/06/2023  Due for re-eval. Using medications without difficulties: Hypoglycemic episodes: Hyperglycemic episodes: Feet problems: none.. no issues refused foot exam. Blood Sugars averaging: not checking eye exam within last year: yes  Not able to provide urine sample today.  Hypertension:    Inadequate  control in office today On amlodipine  10 mg p.o. daily, lisinopril  40 mg p.o. daily, Lasix  20 mg p.o. daily as needed for swelling  Did miss dose of BP med last night BP Readings from Last 3 Encounters:  08/19/23 (!) 160/70  10/30/22 (!) 154/84  10/28/22 (!) 119/52  Using medication without problems or lightheadedness:  occ Chest pain with exertion: none Edema: stable Short of breath: stable Average home BPs: at home  none Other issues: Wt Readings from Last 3 Encounters:  08/19/23 146 lb 6 oz (66.4 kg)  02/21/23 140 lb (63.5 kg)  10/30/22 140 lb (63.5 kg)    Elevated Cholesterol:. LDL at goal less than 70 on atorvastatin  40 mg p.o. daily Lab Results  Component Value Date   CHOL 168 08/06/2023   HDL 54.90 08/06/2023   LDLCALC 93 08/06/2023   LDLDIRECT 105.5 08/06/2013   TRIG 99.0 08/06/2023   CHOLHDL 3 08/06/2023  Using medications without  problems: Muscle aches:  Diet compliance: moderate Exercise: minimal Other complaints:   Lung cancer, active, mets to brain: Last OV Dr. Virgene reviewed.  Reviewed OV from palliative care for pain management 07/2023 LAst OV 10/2022 Dr. Sherrod  On Xtampza  18mg  every 8 hours for pain. Tolerating well.   MSIR 30 mg prn q 4 hours.       Relevant past medical, surgical, family and social history reviewed and updated as indicated. Interim medical history since our last visit reviewed. Allergies and medications reviewed and updated. Outpatient Medications Prior to Visit  Medication Sig Dispense Refill   albuterol  (VENTOLIN  HFA) 108 (90 Base) MCG/ACT inhaler TAKE 2 PUFFS BY MOUTH EVERY 6 HOURS AS NEEDED FOR WHEEZE OR SHORTNESS OF BREATH 18 each 5   amLODipine  (NORVASC ) 10 MG tablet TAKE 1 TABLET BY MOUTH EVERY DAY 90 tablet 1   aspirin  325 MG EC tablet TAKE 1 TABLET BY MOUTH EVERY DAY 90 tablet 0   atorvastatin  (LIPITOR) 40 MG tablet TAKE 1 TABLET BY MOUTH EVERY DAY AT 6 PM 90 tablet 0   Cholecalciferol (VITAMIN D3) 50 MCG (2000 UT) TABS Take by mouth.     diphenhydrAMINE  (BENADRYL ) 25 MG tablet Take 1 tablet (25 mg total) by mouth as directed. 2 hours before CT scan. 5 tablet 0   FeFum-FePoly-FA-B Cmp-C-Biot (FOLIVANE-PLUS) CAPS  TAKE 1 CAPSULE BY MOUTH EVERY DAY IN THE MORNING 90 capsule 3   fluticasone  (FLONASE ) 50 MCG/ACT nasal spray Place 1-2 sprays into both nostrils daily as needed. 16 g 5   INCRUSE ELLIPTA  62.5 MCG/ACT AEPB INHALE 1 PUFF BY MOUTH EVERY DAY 30 each 5   lidocaine -prilocaine  (EMLA ) cream APPLY TO AFFECTED AREA FOR 1 DOSE 30 g 2   lisinopril  (ZESTRIL ) 20 MG tablet TAKE 2 TABLETS (40 MG TOTAL) BY MOUTH DAILY. 180 tablet 0   morphine  (MSIR) 30 MG tablet Take 1 tablet (30 mg total) by mouth every 4 (four) hours as needed for severe pain (pain score 7-10). 90 tablet 0   Olopatadine  HCl 0.2 % SOLN INSTILL 1 DROP INTO AFFECTED EYE(S) EVERY DAY 2.5 mL 1   ondansetron  (ZOFRAN )  8 MG tablet Take 1 tablet (8 mg total) by mouth every 8 (eight) hours as needed for nausea or vomiting. 30 tablet 3   oxyCODONE  ER (XTAMPZA  ER) 18 MG C12A Take 18 mg by mouth every 8 (eight) hours. 90 capsule 0   predniSONE  (DELTASONE ) 50 MG tablet 1 tablet by mouth 13 hours ,7 hours and 1 hour  before CT scan 3 tablet 12   prochlorperazine  (COMPAZINE ) 10 MG tablet Take 1 tablet (10 mg total) by mouth every 6 (six) hours as needed. 30 tablet 3   triamcinolone  (KENALOG ) 0.025 % ointment APPLY TO AFFECTED AREA TWICE A DAY 30 g 0   alendronate  (FOSAMAX ) 70 MG tablet TAKE 1 TABLET BY MOUTH EVERY 7 (SEVEN) DAYS. TAKE WITH A FULL GLASS OF WATER ON AN EMPTY STOMACH. 12 tablet 1   furosemide  (LASIX ) 20 MG tablet TAKE 1 TABLET BY MOUTH EVERY DAY AS NEEDED FOR SWELLING 20 tablet 1   lidocaine -prilocaine  (EMLA ) cream Apply 1 Application topically once.     No facility-administered medications prior to visit.     Per HPI unless specifically indicated in ROS section below Review of Systems  Constitutional:  Negative for fatigue and fever.  HENT:  Negative for congestion.   Eyes:  Negative for pain.  Respiratory:  Negative for cough and shortness of breath.   Cardiovascular:  Negative for chest pain, palpitations and leg swelling.  Gastrointestinal:  Negative for abdominal pain.  Genitourinary:  Negative for dysuria and vaginal bleeding.  Musculoskeletal:  Negative for back pain.  Neurological:  Negative for syncope, light-headedness and headaches.  Psychiatric/Behavioral:  Negative for dysphoric mood.    Objective:  BP (!) 160/70   Pulse 91   Temp 97.8 F (36.6 C) (Temporal)   Ht 5' 2.25 (1.581 m)   Wt 146 lb 6 oz (66.4 kg)   SpO2 98%   BMI 26.56 kg/m   Wt Readings from Last 3 Encounters:  08/19/23 146 lb 6 oz (66.4 kg)  02/21/23 140 lb (63.5 kg)  10/30/22 140 lb (63.5 kg)      Physical Exam Constitutional:      General: She is not in acute distress.    Appearance: Normal appearance.  She is well-developed. She is not ill-appearing or toxic-appearing.  HENT:     Head: Normocephalic.     Right Ear: Hearing, tympanic membrane, ear canal and external ear normal. Tympanic membrane is not erythematous, retracted or bulging.     Left Ear: Hearing, tympanic membrane, ear canal and external ear normal. Tympanic membrane is not erythematous, retracted or bulging.     Nose: No mucosal edema or rhinorrhea.     Right Sinus: No maxillary sinus tenderness  or frontal sinus tenderness.     Left Sinus: No maxillary sinus tenderness or frontal sinus tenderness.     Mouth/Throat:     Pharynx: Uvula midline.  Eyes:     General: Lids are normal. Lids are everted, no foreign bodies appreciated.     Conjunctiva/sclera: Conjunctivae normal.     Pupils: Pupils are equal, round, and reactive to light.  Neck:     Thyroid: No thyroid mass or thyromegaly.     Vascular: No carotid bruit.     Trachea: Trachea normal.  Cardiovascular:     Rate and Rhythm: Normal rate and regular rhythm.     Pulses: Normal pulses.     Heart sounds: Normal heart sounds, S1 normal and S2 normal. No murmur heard.    No friction rub. No gallop.  Pulmonary:     Effort: Pulmonary effort is normal. No tachypnea or respiratory distress.     Breath sounds: Rhonchi present. No decreased breath sounds, wheezing or rales.  Abdominal:     General: Bowel sounds are normal.     Palpations: Abdomen is soft.     Tenderness: There is no abdominal tenderness.  Musculoskeletal:     Cervical back: Normal range of motion and neck supple.  Skin:    General: Skin is warm and dry.     Findings: No rash.  Neurological:     Mental Status: She is alert.  Psychiatric:        Mood and Affect: Mood is not anxious or depressed.        Speech: Speech normal.        Behavior: Behavior normal. Behavior is cooperative.        Thought Content: Thought content normal.        Judgment: Judgment normal.       Diabetic foot exam: Patient  refused Results for orders placed or performed in visit on 08/13/23  Microalbumin / creatinine urine ratio   Collection Time: 08/13/23 10:11 AM  Result Value Ref Range   Microalb, Ur <0.7 mg/dL   Creatinine,U 78.2 mg/dL   Microalb Creat Ratio Unable to calculate 0.0 - 30.0 mg/g   *Note: Due to a large number of results and/or encounters for the requested time period, some results have not been displayed. A complete set of results can be found in Results Review.     COVID 19 screen:  No recent travel or known exposure to COVID19 The patient denies respiratory symptoms of COVID 19 at this time. The importance of social distancing was discussed today.   Assessment and Plan The patient's preventative maintenance and recommended screening tests for an annual wellness exam were reviewed in full today. Brought up to date unless services declined.  Counselled on the importance of diet, exercise, and its role in overall health and mortality. The patient's FH and SH was reviewed, including their home life, tobacco status, and drug and alcohol status.   Uptodate flu shot. Hep C screening - completed HIV screening - completed A1C - completed Mammogram/ breast exam - declined PAP smear - declined Shingles - contraindicated Eye exam - pt reported exam .. Requested report Colonoscopy: declined  DEXA due, off fosamax .. wiches to hold on DEXA now. 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   Not interested in preventative care.      COPD, moderate (HCC)   Chronic, stable control Albuterol   inhaler as needed.      Diabetes mellitus with no complication (HCC) - Primary (Chronic)   Chronic, A1c in office today 5.7 in normal range on diet.  Pt would like this removed from problem list given mainly due to medication ( prednisone ) and  has been  in prediabetes range for years.      Hyperlipidemia associated  with type 2 diabetes mellitus (HCC) (Chronic)    Chronic, not at goal < 70 , but pt does not wish to be more aggressive with statin  She wishes to continue atorvastatin  at current dose.  Atorvastatin  40 mg p.o. daily      Hypertension associated with diabetes (HCC) (Chronic)    Chronic, BP elevated in office today and at last  OV. Did not take her med last night. Will follow at home.SABRA goal < 140/90.   amlodipine  10 mg p.o. daily  lisinopril  20 mg p.o. daily  Lasix  20 mg p.o. 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  Resolved      Vitamin D  deficiency    Chronic, replace with weekly vit D. Pt would like to continue weekly long term to remember.         Greig Ring, MD

## 2023-08-19 NOTE — Assessment & Plan Note (Addendum)
 Chronic, A1c in office today 5.7 in normal range on diet.  Pt would like this removed from problem list given mainly due to medication ( prednisone ) and  has been  in prediabetes range for years.

## 2023-08-19 NOTE — Assessment & Plan Note (Signed)
 Chronic, not at goal < 70 , but pt does not wish to be more aggressive with statin  She wishes to continue atorvastatin  at current dose.  Atorvastatin  40 mg p.o. daily

## 2023-08-19 NOTE — Assessment & Plan Note (Addendum)
 Chronic, BP elevated in office today and at last  OV. Did not take her med last night. Will follow at home.SABRA goal < 140/90.   amlodipine  10 mg p.o. daily  lisinopril  20 mg p.o. daily  Lasix  20 mg p.o. daily as needed for swelling

## 2023-08-19 NOTE — Assessment & Plan Note (Signed)
 Currently on palliative care.   Not interested in preventative care.

## 2023-08-19 NOTE — Patient Instructions (Signed)
 Call when ready for bone density.

## 2023-08-19 NOTE — Assessment & Plan Note (Signed)
Chronic, stable control Albuterol inhaler as needed.

## 2023-08-27 ENCOUNTER — Encounter: Payer: Self-pay | Admitting: Nurse Practitioner

## 2023-08-27 ENCOUNTER — Other Ambulatory Visit: Payer: Self-pay | Admitting: Family Medicine

## 2023-08-27 ENCOUNTER — Inpatient Hospital Stay: Attending: Nurse Practitioner

## 2023-08-27 DIAGNOSIS — Z515 Encounter for palliative care: Secondary | ICD-10-CM

## 2023-08-27 DIAGNOSIS — C3410 Malignant neoplasm of upper lobe, unspecified bronchus or lung: Secondary | ICD-10-CM | POA: Diagnosis not present

## 2023-08-27 DIAGNOSIS — G893 Neoplasm related pain (acute) (chronic): Secondary | ICD-10-CM

## 2023-08-27 DIAGNOSIS — C3490 Malignant neoplasm of unspecified part of unspecified bronchus or lung: Secondary | ICD-10-CM | POA: Diagnosis not present

## 2023-08-27 MED ORDER — XTAMPZA ER 18 MG PO C12A: 18.0000 mg | EXTENDED_RELEASE_CAPSULE | Freq: Three times a day (TID) | ORAL | 0 refills | Status: DC

## 2023-08-28 ENCOUNTER — Encounter: Payer: Self-pay | Admitting: Internal Medicine

## 2023-08-28 NOTE — Progress Notes (Signed)
 Palliative Medicine [facility]  [phone] [phone]) [phone]   Name: Danielle [name] Date: 08/28/2023 MRN: [id number]  DOB: 1950-03-20  Patient Care Team: Danielle Greig BRAVO, MD as PCP - General Danielle Sherrod, MD as Consulting Physician (Oncology) Danielle [name], [facility] (Inactive) as Pharmacist (Pharmacist) Pickenpack-Cousar, Fannie SAILOR, NP as Nurse Practitioner (Nurse Practitioner) [facility], [facility], Danielle [name]   I connected with Danielle [name] on 08/28/23 at  4:00 PM EDT by phone and verified that I am speaking with the correct person using two identifiers.   I discussed the limitations, risks, security and privacy concerns of performing an evaluation and management service by telemedicine and the availability of in-person appointments. I also discussed with the patient that there may be a patient responsible charge related to this service. The patient expressed understanding and agreed to proceed.   Other persons participating in the visit and their role in the encounter: n/a   Patient's location: home  Provider's location: [facility]   Chief Complaint: f/u of symptom management    INTERVAL HISTORY: Danielle [name] is a 73 y.o. 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 Past medical history also includes COPD, diabetes mellitus, insomnia, anemia, as well as a cerebral infarct(10/2014). Palliative ask to see for symptom and pain management and goals of care.   SOCIAL HISTORY:     reports that she quit smoking about 17 years ago. Her smoking use included cigarettes. She started smoking about 47 years ago. She has a 60 pack-year smoking history. She has never used smokeless tobacco. She reports that she does not drink alcohol and does not use drugs.  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 had chemo and radiation for lung ca   Thrush 12/11/2010    ALLERGIES:  is allergic to contrast media [iodinated contrast media], iohexol , sulfa drugs cross reactors, and co-trimoxazole injection [sulfamethoxazole-trimethoprim].  MEDICATIONS:  Current Outpatient Medications  Medication Sig Dispense Refill   albuterol  (VENTOLIN  HFA) 108 (90 Base) MCG/ACT inhaler TAKE 2 PUFFS BY MOUTH EVERY 6 HOURS AS NEEDED FOR WHEEZE OR SHORTNESS OF BREATH 18 each 5   amLODipine  (NORVASC ) 10 MG tablet TAKE 1 TABLET BY MOUTH EVERY DAY 90 tablet 1   aspirin  325 MG EC tablet TAKE 1 TABLET BY MOUTH EVERY DAY 90 tablet 0   atorvastatin  (LIPITOR) 40 MG tablet TAKE 1 TABLET BY MOUTH EVERY DAY AT 6 PM 90 tablet 0   Cholecalciferol (VITAMIN D3) 50 MCG (2000 UT) TABS Take by mouth.     diphenhydrAMINE  (BENADRYL ) 25 MG tablet Take 1 tablet (25 mg total) by mouth as directed. 2 hours before CT scan. 5 tablet 0   FeFum-FePoly-FA-B Cmp-C-Biot (FOLIVANE-PLUS) CAPS TAKE 1 CAPSULE  BY MOUTH EVERY DAY IN THE MORNING 90 capsule 3   fluticasone  (FLONASE ) 50 MCG/ACT nasal spray Place 1-2 sprays into both nostrils daily as needed. 16 g 5   INCRUSE ELLIPTA  62.5 MCG/ACT AEPB INHALE 1 PUFF BY MOUTH EVERY DAY 30 each 5   lidocaine -prilocaine  (EMLA ) cream APPLY TO AFFECTED AREA FOR 1 DOSE 30 g 2   lisinopril  (ZESTRIL ) 20 MG tablet TAKE 2 TABLETS (40 MG TOTAL) BY MOUTH DAILY. 180 tablet 0   morphine  (MSIR) 30  MG tablet Take 1 tablet (30 mg total) by mouth every 4 (four) hours as needed for severe pain (pain score 7-10). 90 tablet 0   Olopatadine  HCl 0.2 % SOLN INSTILL 1 DROP INTO AFFECTED EYE(S) EVERY DAY 2.5 mL 1   ondansetron  (ZOFRAN ) 8 MG tablet Take 1 tablet (8 mg total) by mouth every 8 (eight) hours as needed for nausea or vomiting. 30 tablet 3   oxyCODONE  ER (XTAMPZA  ER) 18 MG C12A Take 18 mg by mouth every 8 (eight) hours. 90 capsule 0   predniSONE  (DELTASONE ) 50 MG tablet 1 tablet by mouth 13 hours ,7 hours and 1 hour  before CT scan 3 tablet 12   prochlorperazine  (COMPAZINE ) 10 MG tablet Take 1 tablet (10 mg total) by mouth every 6 (six) hours as needed. 30 tablet 3   triamcinolone  (KENALOG ) 0.025 % ointment APPLY TO AFFECTED AREA TWICE A DAY 30 g 0   Vitamin D , Ergocalciferol , (DRISDOL ) 1.25 MG (50000 UNIT) CAPS capsule Take 1 capsule (50,000 Units total) by mouth every 7 (seven) days. 12 capsule 1   No current facility-administered medications for this visit.    VITAL SIGNS: There were no vitals taken for this visit. There were no vitals filed for this visit.  Estimated body mass index is 26.56 kg/m as calculated from the following:   Height as of 08/19/23: 5' 2.25 (1.581 m).   Weight as of 08/19/23: 146 lb 6 oz (66.4 kg).   PERFORMANCE STATUS (ECOG) : 1 - Symptomatic but completely ambulatory   IMPRESSION:  I connected with Danielle [name] via phone for follow-up. No acute distress. Denies any concerns with uncontrolled nausea, vomiting, constipation, or diarrhea. Appetite is good. She is remaining as active as possible.   Danielle [name] reports her pain is well controlled on current regimen. She is tolerating Xtampza  18mg  every 8 hours and MS IR as needed for breakthrough pain. Does not require breakthrough around the clock. No adjustments to regimen at this time.   All questions answered and support provided.   PLAN:  Cancer Related Pain Stable on Xtampza . No issues with  constipation or diarrhea. No need for antiemetics at this time. -Continue Xtampza  18mg  every 8 hours as prescribed. -Continu MS IR 30mg  every 4 hours as needed for breakthrough pain  Follow-up Patient to be seen in 6-8 weeks via telephone.   Patient expressed understanding and was in agreement with this plan. She also understands that She can call the clinic at any time with any questions, concerns, or complaints.   Any controlled substances utilized were prescribed in the context of palliative care. PDMP has been reviewed.   I provided  20 minutes of non face-to-face telephone visit time during this encounter. Visit consisted of counseling and education dealing with the complex and emotionally intense issues of symptom management and palliative care in the setting of serious and potentially life-threatening illness.  Levon Borer, AGPCNP-BC  Palliative Medicine Team/[facility]

## 2023-08-31 ENCOUNTER — Other Ambulatory Visit: Payer: Self-pay | Admitting: Family Medicine

## 2023-09-29 ENCOUNTER — Other Ambulatory Visit: Payer: Self-pay

## 2023-09-29 DIAGNOSIS — G893 Neoplasm related pain (acute) (chronic): Secondary | ICD-10-CM

## 2023-09-29 DIAGNOSIS — C3410 Malignant neoplasm of upper lobe, unspecified bronchus or lung: Secondary | ICD-10-CM

## 2023-09-29 DIAGNOSIS — Z515 Encounter for palliative care: Secondary | ICD-10-CM

## 2023-09-29 DIAGNOSIS — C3490 Malignant neoplasm of unspecified part of unspecified bronchus or lung: Secondary | ICD-10-CM

## 2023-09-29 MED ORDER — MORPHINE SULFATE 30 MG PO TABS: 30.0000 mg | ORAL_TABLET | ORAL | 0 refills | Status: DC | PRN

## 2023-09-29 MED ORDER — XTAMPZA ER 18 MG PO C12A: 18.0000 mg | EXTENDED_RELEASE_CAPSULE | Freq: Three times a day (TID) | ORAL | 0 refills | Status: DC

## 2023-10-15 ENCOUNTER — Inpatient Hospital Stay: Admitting: Nurse Practitioner

## 2023-10-30 ENCOUNTER — Other Ambulatory Visit: Payer: Self-pay

## 2023-10-30 DIAGNOSIS — C3410 Malignant neoplasm of upper lobe, unspecified bronchus or lung: Secondary | ICD-10-CM

## 2023-10-30 DIAGNOSIS — C3490 Malignant neoplasm of unspecified part of unspecified bronchus or lung: Secondary | ICD-10-CM

## 2023-10-30 DIAGNOSIS — Z515 Encounter for palliative care: Secondary | ICD-10-CM

## 2023-10-30 DIAGNOSIS — G893 Neoplasm related pain (acute) (chronic): Secondary | ICD-10-CM

## 2023-10-30 MED ORDER — XTAMPZA ER 18 MG PO C12A: 18.0000 mg | EXTENDED_RELEASE_CAPSULE | Freq: Three times a day (TID) | ORAL | 0 refills | Status: DC

## 2023-11-04 DIAGNOSIS — C7931 Secondary malignant neoplasm of brain: Secondary | ICD-10-CM | POA: Diagnosis not present

## 2023-11-07 ENCOUNTER — Other Ambulatory Visit: Payer: Self-pay

## 2023-11-07 ENCOUNTER — Telehealth: Payer: Self-pay | Admitting: Physician Assistant

## 2023-11-07 ENCOUNTER — Other Ambulatory Visit: Payer: Self-pay | Admitting: Physician Assistant

## 2023-11-07 ENCOUNTER — Telehealth: Payer: Self-pay

## 2023-11-07 DIAGNOSIS — C3410 Malignant neoplasm of upper lobe, unspecified bronchus or lung: Secondary | ICD-10-CM

## 2023-11-07 NOTE — Telephone Encounter (Signed)
 Scheduled patient for labs and follow up in November. Called and left a voicemail with the appointment details.

## 2023-11-07 NOTE — Telephone Encounter (Signed)
 Received a telephone call from the Linden Ditch, patient's daughter. Daughter stated the patient has not been contacted to schedule CT scan. Let daughter know that I would forward this concern to Dr. Sherrod and his team.

## 2023-11-12 ENCOUNTER — Inpatient Hospital Stay: Admitting: Nurse Practitioner

## 2023-11-14 ENCOUNTER — Other Ambulatory Visit: Payer: Self-pay | Admitting: Family Medicine

## 2023-11-18 ENCOUNTER — Encounter (HOSPITAL_COMMUNITY): Payer: Self-pay

## 2023-11-18 ENCOUNTER — Ambulatory Visit (HOSPITAL_COMMUNITY)
Admission: RE | Admit: 2023-11-18 | Discharge: 2023-11-18 | Disposition: A | Discharge status: Home / Self Care | Source: Ambulatory Visit | Attending: Physician Assistant | Admitting: Physician Assistant

## 2023-11-18 DIAGNOSIS — Y842 Radiological procedure and radiotherapy as the cause of abnormal reaction of the patient, or of later complication, without mention of misadventure at the time of the procedure: Secondary | ICD-10-CM | POA: Diagnosis not present

## 2023-11-18 DIAGNOSIS — J439 Emphysema, unspecified: Secondary | ICD-10-CM | POA: Insufficient documentation

## 2023-11-18 DIAGNOSIS — J90 Pleural effusion, not elsewhere classified: Secondary | ICD-10-CM | POA: Diagnosis not present

## 2023-11-18 DIAGNOSIS — J701 Chronic and other pulmonary manifestations due to radiation: Secondary | ICD-10-CM | POA: Diagnosis not present

## 2023-11-18 DIAGNOSIS — I2510 Atherosclerotic heart disease of native coronary artery without angina pectoris: Secondary | ICD-10-CM | POA: Diagnosis not present

## 2023-11-18 DIAGNOSIS — C799 Secondary malignant neoplasm of unspecified site: Secondary | ICD-10-CM | POA: Insufficient documentation

## 2023-11-18 DIAGNOSIS — N189 Chronic kidney disease, unspecified: Secondary | ICD-10-CM | POA: Insufficient documentation

## 2023-11-18 DIAGNOSIS — I700 Atherosclerosis of aorta: Secondary | ICD-10-CM | POA: Diagnosis not present

## 2023-11-18 DIAGNOSIS — C3410 Malignant neoplasm of upper lobe, unspecified bronchus or lung: Secondary | ICD-10-CM | POA: Diagnosis present

## 2023-11-21 NOTE — Progress Notes (Deleted)
 [facility] OFFICE PROGRESS NOTE  Danielle Greig BRAVO, MD [address] [place] [address]  DIAGNOSIS: Metastatic non-small cell lung cancer initially diagnosed as locally advanced stage IIIB with a right Pancoast tumor involving the vertebral body as well as prominent canal invasion with spinal cord compression in October 2007. The patient also has metastatic disease to the brain in April 2008.   PRIOR THERAPY: Status post concurrent chemoradiation with weekly carboplatin and paclitaxel, last dose was given November 18, 2005. 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 Status post gamma knife radiotherapy to a solitary brain lesion located in the superior frontal area of the brain at [facility] in April of 2008. Status post palliative radiotherapy to the lateral abdominal wall metastatic lesion under the care of Dr. Dewey, completed March of 2009. Status post 6 cycles of systemic chemotherapy with carboplatin and Alimta . Last dose was given July 26, 2007 with disease stabilization. Gamma knife stereotactic radiotherapy to 2 brain lesions one involving the right frontal dural based as well as right parietal lesion performed on 05/07/2012 under the care of Dr. Candyce at [facility]. Maintenance systemic chemotherapy with Alimta  500 MG/M2 every 3 weeks, status post 233 cycles.  Treatment is currently on hold since January 30, 2021.  SBRT to enlarging left upper lobe lung nodule under the care of Dr. Dewey completed on July 27, 2021.  CURRENT THERAPY: Observation   INTERVAL HISTORY: Danielle [name] 73 y.o. female returns to the clinic today for a follow up visit accompanied by her daughter. She is feeling well today without any concerning complaints.    Summary the patient was diagnosed with stage IV lung cancer in 2007.  She is currently on observation after having underwent 233 cycles  of chemotherapy.  Her last treatment and her treatment has been on hold since 2023.  She is feeling well.  She follows with the palliative care team.  Looks like she has been lost to follow-up for approximately 1 year.  She denies any fevers or chills. She reportedly has baseline night sweats for several years, which are unchanged. She denies any hemoptysis, cough, chest pain.  She reports her baseline shortness of breath with exertion.  She denies any diarrhea or constipation.  She denies any headaches or visual changes.  He recently had a restaging CT scan.  She is here today for evaluation and to review her scan results.  MEDICAL HISTORY: Past Medical History:  Diagnosis Date   Anemia    Antineoplastic chemotherapy induced anemia 01/23/2016   Carcinomas, basal cell 01/22/2019   two places on face   CVA (cerebral vascular accident) (HCC) 04/2014   pt states had 2 cva's within 2 wks   Diabetes mellitus without complication (HCC)    DJD (degenerative joint disease), cervical    Fibromyalgia    H/O: pneumonia    History of tobacco abuse quit 10/08   on nicotine patch   Hypertension    Hypokalemia    Lung cancer (HCC) dx'd 09/2005   hx of non-small cell: metastasis to brain. had chemo and radiation for lung ca   Thrush 12/11/2010    ALLERGIES:  is allergic to contrast media [iodinated contrast media], iohexol , sulfa drugs cross reactors, and co-trimoxazole injection [sulfamethoxazole-trimethoprim].  MEDICATIONS:  Current Outpatient Medications  Medication Sig Dispense Refill   albuterol  (VENTOLIN  HFA) 108 (90 Base) MCG/ACT inhaler TAKE 2 PUFFS BY MOUTH EVERY 6 HOURS  AS NEEDED FOR WHEEZE OR SHORTNESS OF BREATH 18 each 5   amLODipine  (NORVASC ) 10 MG tablet TAKE 1 TABLET BY MOUTH EVERY DAY 90 tablet 1   aspirin  325 MG EC tablet TAKE 1 TABLET BY MOUTH EVERY DAY 90 tablet 0   atorvastatin  (LIPITOR) 40 MG tablet TAKE 1 TABLET BY MOUTH EVERY DAY AT 6 PM 90 tablet 3   Cholecalciferol (VITAMIN  D3) 50 MCG (2000 UT) TABS Take by mouth.     diphenhydrAMINE  (BENADRYL ) 25 MG tablet Take 1 tablet (25 mg total) by mouth as directed. 2 hours before CT scan. 5 tablet 0   FeFum-FePoly-FA-B Cmp-C-Biot (FOLIVANE-PLUS) CAPS TAKE 1 CAPSULE BY MOUTH EVERY DAY IN THE MORNING 90 capsule 3   fluticasone  (FLONASE ) 50 MCG/ACT nasal spray Place 1-2 sprays into both nostrils daily as needed. 16 g 5   INCRUSE ELLIPTA  62.5 MCG/ACT AEPB INHALE 1 PUFF BY MOUTH EVERY DAY 30 each 5   lidocaine -prilocaine  (EMLA ) cream APPLY TO AFFECTED AREA FOR 1 DOSE 30 g 2   lisinopril  (ZESTRIL ) 20 MG tablet TAKE 2 TABLETS (40 MG TOTAL) BY MOUTH DAILY. 180 tablet 1   morphine  (MSIR) 30 MG tablet Take 1 tablet (30 mg total) by mouth every 4 (four) hours as needed for severe pain (pain score 7-10). 90 tablet 0   Olopatadine  HCl 0.2 % SOLN INSTILL 1 DROP INTO AFFECTED EYE(S) EVERY DAY 2.5 mL 1   ondansetron  (ZOFRAN ) 8 MG tablet Take 1 tablet (8 mg total) by mouth every 8 (eight) hours as needed for nausea or vomiting. 30 tablet 3   oxyCODONE  ER (XTAMPZA  ER) 18 MG C12A Take 18 mg by mouth every 8 (eight) hours. 90 capsule 0   predniSONE  (DELTASONE ) 50 MG tablet 1 tablet by mouth 13 hours ,7 hours and 1 hour  before CT scan 3 tablet 12   prochlorperazine  (COMPAZINE ) 10 MG tablet Take 1 tablet (10 mg total) by mouth every 6 (six) hours as needed. 30 tablet 3   triamcinolone  (KENALOG ) 0.025 % ointment APPLY TO AFFECTED AREA TWICE A DAY 30 g 0   Vitamin D , Ergocalciferol , (DRISDOL ) 1.25 MG (50000 UNIT) CAPS capsule Take 1 capsule (50,000 Units total) by mouth every 7 (seven) days. 12 capsule 1   No current facility-administered medications for this visit.    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   LOOP RECORDER IMPLANT N/A 04/19/2014   Procedure: LOOP RECORDER IMPLANT;   Surgeon: Lynwood Rakers, MD;  Location[facility] CATH LAB;  Service: Cardiovascular;  Laterality: N/A;   OTHER SURGICAL HISTORY  2008   Gamma knife surgery to remove brain met    PORTACATH PLACEMENT  -[name]   TIP IN CAVOATRIAL JUNCTION   TEE WITHOUT CARDIOVERSION N/A 04/19/2014   Procedure: TRANSESOPHAGEAL ECHOCARDIOGRAM (TEE);  Surgeon: Ezra GORMAN Shuck, MD;  Location: [facility] ENDOSCOPY;  Service: Cardiovascular;  Laterality: N/A;    REVIEW OF SYSTEMS:   Review of Systems  Constitutional: Negative for appetite change, chills, fatigue, fever and unexpected weight change.  HENT:   Negative for mouth sores, nosebleeds, sore throat and trouble swallowing.   Eyes: Negative for eye problems and icterus.  Respiratory: Negative for cough, hemoptysis, shortness of breath and wheezing.   Cardiovascular: Negative for chest pain and leg swelling.  Gastrointestinal: Negative  for abdominal pain, constipation, diarrhea, nausea and vomiting.  Genitourinary: Negative for bladder incontinence, difficulty urinating, dysuria, frequency and hematuria.   Musculoskeletal: Negative for back pain, gait problem, neck pain and neck stiffness.  Skin: Negative for itching and rash.  Neurological: Negative for dizziness, extremity weakness, gait problem, headaches, light-headedness and seizures.  Hematological: Negative for adenopathy. Does not bruise/bleed easily.  Psychiatric/Behavioral: Negative for confusion, depression and sleep disturbance. The patient is not nervous/anxious.     PHYSICAL EXAMINATION:  There were no vitals taken for this visit.  ECOG PERFORMANCE STATUS: {CHL ONC ECOG [id number]  Physical Exam  Constitutional: Oriented to person, place, and time and well-developed, well-nourished, and in no distress. No distress.  HENT:  Head: Normocephalic and atraumatic.  Mouth/Throat: Oropharynx is clear and moist. No oropharyngeal exudate.  Eyes: Conjunctivae are normal. Right eye exhibits no discharge. Left  eye exhibits no discharge. No scleral icterus.  Neck: Normal range of motion. Neck supple.  Cardiovascular: Normal rate, regular rhythm, normal heart sounds and intact distal pulses.   Pulmonary/Chest: Effort normal and breath sounds normal. No respiratory distress. No wheezes. No rales.  Abdominal: Soft. Bowel sounds are normal. Exhibits no distension and no mass. There is no tenderness.  Musculoskeletal: Normal range of motion. Exhibits no edema.  Lymphadenopathy:    No cervical adenopathy.  Neurological: Alert and oriented to person, place, and time. Exhibits normal muscle tone. Gait normal. Coordination normal.  Skin: Skin is warm and dry. No rash noted. Not diaphoretic. No erythema. No pallor.  Psychiatric: Mood, memory and judgment normal.  Vitals reviewed.  LABORATORY DATA: Lab Results  Component Value Date   WBC 11.8 (H) 08/06/2023   HGB 13.8 08/06/2023   HCT 42.4 08/06/2023   MCV 90.4 08/06/2023   PLT 454.0 (H) 08/06/2023      Chemistry      Component Value Date/Time   NA 141 08/06/2023 0803   NA 142 12/24/2016 0816   K 4.3 08/06/2023 0803   K 4.4 12/24/2016 0816   CL 103 08/06/2023 0803   CL 106 06/30/2012 1004   CO2 28 08/06/2023 0803   CO2 28 12/24/2016 0816   BUN 15 08/06/2023 0803   BUN 15.8 12/24/2016 0816   CREATININE 0.80 08/06/2023 0803   CREATININE 1.20 (H) 10/21/2022 0757   CREATININE 1.1 12/24/2016 0816      Component Value Date/Time   CALCIUM  10.1 08/06/2023 0803   CALCIUM  9.6 12/24/2016 0816   ALKPHOS 85 08/06/2023 0803   ALKPHOS 80 12/24/2016 0816   AST 16 08/06/2023 0803   AST 13 (L) 10/21/2022 0757   AST 20 12/24/2016 0816   ALT 9 08/06/2023 0803   ALT 8 10/21/2022 0757   ALT 11 12/24/2016 0816   BILITOT 0.6 08/06/2023 0803   BILITOT 0.5 10/21/2022 0757   BILITOT 0.34 12/24/2016 0816       RADIOGRAPHIC STUDIES:  CT CHEST ABDOMEN PELVIS WO CONTRAST Result Date: 11/20/2023 CLINICAL DATA:  Metastatic non-small-cell lung cancer  restaging * Tracking Code: BO * EXAM: CT CHEST, ABDOMEN AND PELVIS WITHOUT CONTRAST TECHNIQUE: Multidetector CT imaging of the chest, abdomen and pelvis was performed following the standard protocol without IV contrast. RADIATION DOSE REDUCTION: This exam was performed according to the departmental dose-optimization program which includes automated exposure control, adjustment of the mA and/or kV according to patient size and/or use of iterative reconstruction technique. COMPARISON:  10/21/2022 FINDINGS: CT CHEST FINDINGS Cardiovascular: Right chest port catheter. Aortic atherosclerosis. Normal heart size. Three-vessel coronary artery  calcifications no pericardial effusion. Mediastinum/Nodes: No enlarged mediastinal, hilar, or axillary lymph nodes. Thyroid gland, trachea, and esophagus demonstrate no significant findings. Lungs/Pleura: Severe emphysema. Unchanged post treatment appearance of the chest with paramedian radiation fibrosis of the upper lobes unchanged trace right pleural effusion. Bronchiolar plugging throughout the right lung base. Musculoskeletal: Left chest implantable loop recorder. No acute osseous findings. Unchanged high-grade wedge deformity of T3 (series 5, image 68). Vertebral cement augmentation of T4 and T7. CT ABDOMEN PELVIS FINDINGS Hepatobiliary: No solid liver abnormality is seen. No gallstones, gallbladder wall thickening, or biliary dilatation. Pancreas: Unremarkable. No pancreatic ductal dilatation or surrounding inflammatory changes. Spleen: Normal in size without significant abnormality. Adrenals/Urinary Tract: Adrenal glands are unremarkable. Kidneys are normal, without renal calculi, solid lesion, or hydronephrosis. Bladder is unremarkable. Stomach/Bowel: Stomach is within normal limits. Appendix appears normal. No evidence of bowel wall thickening, distention, or inflammatory changes. Vascular/Lymphatic: Severe aortic atherosclerosis. No enlarged abdominal or pelvic lymph nodes.  Reproductive: No mass or other abnormality. Other: No abdominal wall hernia or abnormality. No ascites. Musculoskeletal: No acute osseous findings. IMPRESSION: 1. Unchanged post treatment appearance of the chest with paramedian radiation fibrosis of the upper lobes. 2. No noncontrast evidence of lymphadenopathy or metastatic disease in the chest, abdomen, or pelvis. 3. Unchanged trace right pleural effusion. 4. Severe emphysema. 5. Coronary artery disease. Aortic Atherosclerosis (ICD10-I70.0) and Emphysema (ICD10-J43.9). Electronically Signed   By: Marolyn JONETTA Jaksch M.D.   On: 11/20/2023 07:15     ASSESSMENT/PLAN:  This is a very pleasant 10 Caucasian female with metastatic non-small cell lung cancer that was diagnosed in October 2007.  The patient underwent concurrent chemoradiation followed by consolidation chemotherapy and she was on maintenance single agent chemotherapy with Alimta  for 233 cycles.  In June 2023 she had slight further increase in a poorly marginated irregular solid 1.5 cm medial left upper lobe pulmonary nodule suspicious for metachronous primary bronchogenic carcinoma versus solitary metastasis. The patient underwent SBRT to this lesion and she tolerated it fairly well.   She is currently on observation and feeling well.  The patient was seen with Dr. Sherrod today.  Dr. Sherrod personally and independently reviewed the scan and discussed results with the patient today.  The scan showed ***.  Dr. Sherrod recommends ***  See her back for labs and a repeat CT scan in ***months.  We will see her 1 week later to review the results in the office.  Flush?  She will continue to follow with palliative care.   The patient was advised to call immediately if she has any concerning symptoms in the interval. The patient voices understanding of current disease status and treatment options and is in agreement with the current care plan. All questions were answered. The patient knows to  call the clinic with any problems, questions or concerns. We can certainly see the patient much sooner if necessary   No orders of the defined types were placed in this encounter.    I spent {CHL ONC TIME VISIT - DTPQU:[id number]} counseling the patient face to face. The total time spent in the appointment was {CHL ONC TIME VISIT - DTPQU:[id number]}.  Aryahna Spagna L Kaiea Esselman, PA-C 11/21/23

## 2023-11-24 ENCOUNTER — Other Ambulatory Visit: Payer: Self-pay

## 2023-11-24 DIAGNOSIS — C3490 Malignant neoplasm of unspecified part of unspecified bronchus or lung: Secondary | ICD-10-CM

## 2023-11-24 DIAGNOSIS — C3410 Malignant neoplasm of upper lobe, unspecified bronchus or lung: Secondary | ICD-10-CM

## 2023-11-24 DIAGNOSIS — G893 Neoplasm related pain (acute) (chronic): Secondary | ICD-10-CM

## 2023-11-24 DIAGNOSIS — Z515 Encounter for palliative care: Secondary | ICD-10-CM

## 2023-11-24 MED ORDER — XTAMPZA ER 18 MG PO C12A
18.0000 mg | EXTENDED_RELEASE_CAPSULE | Freq: Three times a day (TID) | ORAL | 0 refills | Status: AC
Start: 2023-11-29 — End: ?

## 2023-11-24 NOTE — Progress Notes (Unsigned)
 [facility] OFFICE PROGRESS NOTE  Danielle Greig BRAVO, MD [address] [place] [address]  DIAGNOSIS: Metastatic non-small cell lung cancer initially diagnosed as locally advanced stage IIIB with a right Pancoast tumor involving the vertebral body as well as prominent canal invasion with spinal cord compression in October 2007. The patient also has metastatic disease to the brain in April 2008.   PRIOR THERAPY: Status post concurrent chemoradiation with weekly carboplatin and paclitaxel, last dose was given November 18, 2005. 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 Status post gamma knife radiotherapy to a solitary brain lesion located in the superior frontal area of the brain at [facility] in April of 2008. Status post palliative radiotherapy to the lateral abdominal wall metastatic lesion under the care of Dr. Dewey, completed March of 2009. Status post 6 cycles of systemic chemotherapy with carboplatin and Alimta . Last dose was given July 26, 2007 with disease stabilization. Gamma knife stereotactic radiotherapy to 2 brain lesions one involving the right frontal dural based as well as right parietal lesion performed on 05/07/2012 under the care of Dr. Candyce at [facility]. Maintenance systemic chemotherapy with Alimta  500 MG/M2 every 3 weeks, status post 233 cycles.  Treatment is currently on hold since January 30, 2021.  SBRT to enlarging left upper lobe lung nodule under the care of Dr. Dewey completed on July 27, 2021.  CURRENT THERAPY: Observation   INTERVAL HISTORY: Danielle [name] 73 y.o. female returns to the clinic today for a follow up visit accompanied by her daughter. She is feeling well today without any concerning complaints.    Summary the patient was diagnosed with stage IV lung cancer in 2007.  She is currently on observation after having underwent 233 cycles  of chemotherapy.  Her last treatment and her treatment has been on hold since 2023.  She is feeling well.  She follows with the palliative care team.  Looks like she has been lost to follow-up for approximately 1 year.  She denies any fevers or chills. She reportedly has baseline night sweats for several years, which are unchanged. She denies any hemoptysis, cough, chest pain.  She reports her baseline shortness of breath with exertion.  She denies any diarrhea or constipation.  She denies any headaches or visual changes.  He recently had a restaging CT scan.  She is here today for evaluation and to review her scan results.  MEDICAL HISTORY: Past Medical History:  Diagnosis Date   Anemia    Antineoplastic chemotherapy induced anemia 01/23/2016   Carcinomas, basal cell 01/22/2019   two places on face   CVA (cerebral vascular accident) (HCC) 04/2014   pt states had 2 cva's within 2 wks   Diabetes mellitus without complication (HCC)    DJD (degenerative joint disease), cervical    Fibromyalgia    H/O: pneumonia    History of tobacco abuse quit 10/08   on nicotine patch   Hypertension    Hypokalemia    Lung cancer (HCC) dx'd 09/2005   hx of non-small cell: metastasis to brain. had chemo and radiation for lung ca   Thrush 12/11/2010    ALLERGIES:  is allergic to contrast media [iodinated contrast media], iohexol , sulfa drugs cross reactors, and co-trimoxazole injection [sulfamethoxazole-trimethoprim].  MEDICATIONS:  Current Outpatient Medications  Medication Sig Dispense Refill   albuterol  (VENTOLIN  HFA) 108 (90 Base) MCG/ACT inhaler TAKE 2 PUFFS BY MOUTH EVERY 6 HOURS  AS NEEDED FOR WHEEZE OR SHORTNESS OF BREATH 18 each 5   amLODipine  (NORVASC ) 10 MG tablet TAKE 1 TABLET BY MOUTH EVERY DAY 90 tablet 1   aspirin  325 MG EC tablet TAKE 1 TABLET BY MOUTH EVERY DAY 90 tablet 0   atorvastatin  (LIPITOR) 40 MG tablet TAKE 1 TABLET BY MOUTH EVERY DAY AT 6 PM 90 tablet 3   Cholecalciferol (VITAMIN  D3) 50 MCG (2000 UT) TABS Take by mouth.     diphenhydrAMINE  (BENADRYL ) 25 MG tablet Take 1 tablet (25 mg total) by mouth as directed. 2 hours before CT scan. 5 tablet 0   FeFum-FePoly-FA-B Cmp-C-Biot (FOLIVANE-PLUS) CAPS TAKE 1 CAPSULE BY MOUTH EVERY DAY IN THE MORNING 90 capsule 3   fluticasone  (FLONASE ) 50 MCG/ACT nasal spray Place 1-2 sprays into both nostrils daily as needed. 16 g 5   INCRUSE ELLIPTA  62.5 MCG/ACT AEPB INHALE 1 PUFF BY MOUTH EVERY DAY 30 each 5   lidocaine -prilocaine  (EMLA ) cream APPLY TO AFFECTED AREA FOR 1 DOSE 30 g 2   lisinopril  (ZESTRIL ) 20 MG tablet TAKE 2 TABLETS (40 MG TOTAL) BY MOUTH DAILY. 180 tablet 1   morphine  (MSIR) 30 MG tablet Take 1 tablet (30 mg total) by mouth every 4 (four) hours as needed for severe pain (pain score 7-10). 90 tablet 0   Olopatadine  HCl 0.2 % SOLN INSTILL 1 DROP INTO AFFECTED EYE(S) EVERY DAY 2.5 mL 1   ondansetron  (ZOFRAN ) 8 MG tablet Take 1 tablet (8 mg total) by mouth every 8 (eight) hours as needed for nausea or vomiting. 30 tablet 3   oxyCODONE  ER (XTAMPZA  ER) 18 MG C12A Take 18 mg by mouth every 8 (eight) hours. 90 capsule 0   predniSONE  (DELTASONE ) 50 MG tablet 1 tablet by mouth 13 hours ,7 hours and 1 hour  before CT scan 3 tablet 12   prochlorperazine  (COMPAZINE ) 10 MG tablet Take 1 tablet (10 mg total) by mouth every 6 (six) hours as needed. 30 tablet 3   triamcinolone  (KENALOG ) 0.025 % ointment APPLY TO AFFECTED AREA TWICE A DAY 30 g 0   Vitamin D , Ergocalciferol , (DRISDOL ) 1.25 MG (50000 UNIT) CAPS capsule Take 1 capsule (50,000 Units total) by mouth every 7 (seven) days. 12 capsule 1   No current facility-administered medications for this visit.    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   LOOP RECORDER IMPLANT N/A 04/19/2014   Procedure: LOOP RECORDER IMPLANT;   Surgeon: Lynwood Rakers, MD;  Location: [facility] CATH LAB;  Service: Cardiovascular;  Laterality: N/A;   OTHER SURGICAL HISTORY  2008   Gamma knife surgery to remove brain met    PORTACATH PLACEMENT  -[name]   TIP IN CAVOATRIAL JUNCTION   TEE WITHOUT CARDIOVERSION N/A 04/19/2014   Procedure: TRANSESOPHAGEAL ECHOCARDIOGRAM (TEE);  Surgeon: Ezra GORMAN Shuck, MD;  Location: [facility] ENDOSCOPY;  Service: Cardiovascular;  Laterality: N/A;    REVIEW OF SYSTEMS:   Review of Systems  Constitutional: Negative for appetite change, chills, fatigue, fever and unexpected weight change.  HENT:   Negative for mouth sores, nosebleeds, sore throat and trouble swallowing.   Eyes: Negative for eye problems and icterus.  Respiratory: Negative for cough, hemoptysis, shortness of breath and wheezing.   Cardiovascular: Negative for chest pain and leg swelling.  Gastrointestinal: Negative  for abdominal pain, constipation, diarrhea, nausea and vomiting.  Genitourinary: Negative for bladder incontinence, difficulty urinating, dysuria, frequency and hematuria.   Musculoskeletal: Negative for back pain, gait problem, neck pain and neck stiffness.  Skin: Negative for itching and rash.  Neurological: Negative for dizziness, extremity weakness, gait problem, headaches, light-headedness and seizures.  Hematological: Negative for adenopathy. Does not bruise/bleed easily.  Psychiatric/Behavioral: Negative for confusion, depression and sleep disturbance. The patient is not nervous/anxious.     PHYSICAL EXAMINATION:  There were no vitals taken for this visit.  ECOG PERFORMANCE STATUS: {CHL ONC ECOG [id number]  Physical Exam  Constitutional: Oriented to person, place, and time and well-developed, well-nourished, and in no distress. No distress.  HENT:  Head: Normocephalic and atraumatic.  Mouth/Throat: Oropharynx is clear and moist. No oropharyngeal exudate.  Eyes: Conjunctivae are normal. Right eye exhibits no discharge. Left  eye exhibits no discharge. No scleral icterus.  Neck: Normal range of motion. Neck supple.  Cardiovascular: Normal rate, regular rhythm, normal heart sounds and intact distal pulses.   Pulmonary/Chest: Effort normal and breath sounds normal. No respiratory distress. No wheezes. No rales.  Abdominal: Soft. Bowel sounds are normal. Exhibits no distension and no mass. There is no tenderness.  Musculoskeletal: Normal range of motion. Exhibits no edema.  Lymphadenopathy:    No cervical adenopathy.  Neurological: Alert and oriented to person, place, and time. Exhibits normal muscle tone. Gait normal. Coordination normal.  Skin: Skin is warm and dry. No rash noted. Not diaphoretic. No erythema. No pallor.  Psychiatric: Mood, memory and judgment normal.  Vitals reviewed.  LABORATORY DATA: Lab Results  Component Value Date   WBC 11.8 (H) 08/06/2023   HGB 13.8 08/06/2023   HCT 42.4 08/06/2023   MCV 90.4 08/06/2023   PLT 454.0 (H) 08/06/2023      Chemistry      Component Value Date/Time   NA 141 08/06/2023 0803   NA 142 12/24/2016 0816   K 4.3 08/06/2023 0803   K 4.4 12/24/2016 0816   CL 103 08/06/2023 0803   CL 106 06/30/2012 1004   CO2 28 08/06/2023 0803   CO2 28 12/24/2016 0816   BUN 15 08/06/2023 0803   BUN 15.8 12/24/2016 0816   CREATININE 0.80 08/06/2023 0803   CREATININE 1.20 (H) 10/21/2022 0757   CREATININE 1.1 12/24/2016 0816      Component Value Date/Time   CALCIUM  10.1 08/06/2023 0803   CALCIUM  9.6 12/24/2016 0816   ALKPHOS 85 08/06/2023 0803   ALKPHOS 80 12/24/2016 0816   AST 16 08/06/2023 0803   AST 13 (L) 10/21/2022 0757   AST 20 12/24/2016 0816   ALT 9 08/06/2023 0803   ALT 8 10/21/2022 0757   ALT 11 12/24/2016 0816   BILITOT 0.6 08/06/2023 0803   BILITOT 0.5 10/21/2022 0757   BILITOT 0.34 12/24/2016 0816       RADIOGRAPHIC STUDIES:  CT CHEST ABDOMEN PELVIS WO CONTRAST Result Date: 11/20/2023 CLINICAL DATA:  Metastatic non-small-cell lung cancer  restaging * Tracking Code: BO * EXAM: CT CHEST, ABDOMEN AND PELVIS WITHOUT CONTRAST TECHNIQUE: Multidetector CT imaging of the chest, abdomen and pelvis was performed following the standard protocol without IV contrast. RADIATION DOSE REDUCTION: This exam was performed according to the departmental dose-optimization program which includes automated exposure control, adjustment of the mA and/or kV according to patient size and/or use of iterative reconstruction technique. COMPARISON:  10/21/2022 FINDINGS: CT CHEST FINDINGS Cardiovascular: Right chest port catheter. Aortic atherosclerosis. Normal heart size. Three-vessel coronary artery  calcifications no pericardial effusion. Mediastinum/Nodes: No enlarged mediastinal, hilar, or axillary lymph nodes. Thyroid gland, trachea, and esophagus demonstrate no significant findings. Lungs/Pleura: Severe emphysema. Unchanged post treatment appearance of the chest with paramedian radiation fibrosis of the upper lobes unchanged trace right pleural effusion. Bronchiolar plugging throughout the right lung base. Musculoskeletal: Left chest implantable loop recorder. No acute osseous findings. Unchanged high-grade wedge deformity of T3 (series 5, image 68). Vertebral cement augmentation of T4 and T7. CT ABDOMEN PELVIS FINDINGS Hepatobiliary: No solid liver abnormality is seen. No gallstones, gallbladder wall thickening, or biliary dilatation. Pancreas: Unremarkable. No pancreatic ductal dilatation or surrounding inflammatory changes. Spleen: Normal in size without significant abnormality. Adrenals/Urinary Tract: Adrenal glands are unremarkable. Kidneys are normal, without renal calculi, solid lesion, or hydronephrosis. Bladder is unremarkable. Stomach/Bowel: Stomach is within normal limits. Appendix appears normal. No evidence of bowel wall thickening, distention, or inflammatory changes. Vascular/Lymphatic: Severe aortic atherosclerosis. No enlarged abdominal or pelvic lymph nodes.  Reproductive: No mass or other abnormality. Other: No abdominal wall hernia or abnormality. No ascites. Musculoskeletal: No acute osseous findings. IMPRESSION: 1. Unchanged post treatment appearance of the chest with paramedian radiation fibrosis of the upper lobes. 2. No noncontrast evidence of lymphadenopathy or metastatic disease in the chest, abdomen, or pelvis. 3. Unchanged trace right pleural effusion. 4. Severe emphysema. 5. Coronary artery disease. Aortic Atherosclerosis (ICD10-I70.0) and Emphysema (ICD10-J43.9). Electronically Signed   By: Marolyn JONETTA Jaksch M.D.   On: 11/20/2023 07:15     ASSESSMENT/PLAN:  This is a very pleasant 23 Caucasian female with metastatic non-small cell lung cancer that was diagnosed in October 2007.  The patient underwent concurrent chemoradiation followed by consolidation chemotherapy and she was on maintenance single agent chemotherapy with Alimta  for 233 cycles.  In June 2023 she had slight further increase in a poorly marginated irregular solid 1.5 cm medial left upper lobe pulmonary nodule suspicious for metachronous primary bronchogenic carcinoma versus solitary metastasis. The patient underwent SBRT to this lesion and she tolerated it fairly well.   She is currently on observation and feeling well.  The patient was seen with Dr. Sherrod today.  Dr. Sherrod personally and independently reviewed the scan and discussed results with the patient today.  The scan showed ***.  Dr. Sherrod recommends ***  See her back for labs and a repeat CT scan in ***months.  We will see her 1 week later to review the results in the office.  Flush?  She will continue to follow with palliative care.   The patient was advised to call immediately if she has any concerning symptoms in the interval. The patient voices understanding of current disease status and treatment options and is in agreement with the current care plan. All questions were answered. The patient knows to  call the clinic with any problems, questions or concerns. We can certainly see the patient much sooner if necessary   No orders of the defined types were placed in this encounter.    I spent {CHL ONC TIME VISIT - DTPQU:[id number]} counseling the patient face to face. The total time spent in the appointment was {CHL ONC TIME VISIT - DTPQU:[id number]}.  Danielle Griffo L Yusif Gnau, PA-C 11/24/23

## 2023-11-25 ENCOUNTER — Other Ambulatory Visit: Payer: Self-pay | Admitting: Physician Assistant

## 2023-11-25 ENCOUNTER — Inpatient Hospital Stay: Admitting: Physician Assistant

## 2023-11-25 ENCOUNTER — Encounter: Payer: Self-pay | Admitting: Nurse Practitioner

## 2023-11-25 ENCOUNTER — Inpatient Hospital Stay

## 2023-11-25 ENCOUNTER — Inpatient Hospital Stay: Admitting: Nurse Practitioner

## 2023-11-25 ENCOUNTER — Inpatient Hospital Stay: Attending: Physician Assistant | Admitting: Physician Assistant

## 2023-11-25 VITALS — BP 163/62 | HR 83 | Temp 97.6°F | Resp 15 | Wt 148.0 lb

## 2023-11-25 DIAGNOSIS — C3410 Malignant neoplasm of upper lobe, unspecified bronchus or lung: Secondary | ICD-10-CM

## 2023-11-25 DIAGNOSIS — Z515 Encounter for palliative care: Secondary | ICD-10-CM

## 2023-11-25 DIAGNOSIS — Z9221 Personal history of antineoplastic chemotherapy: Secondary | ICD-10-CM | POA: Insufficient documentation

## 2023-11-25 DIAGNOSIS — Z85118 Personal history of other malignant neoplasm of bronchus and lung: Secondary | ICD-10-CM | POA: Insufficient documentation

## 2023-11-25 DIAGNOSIS — C7931 Secondary malignant neoplasm of brain: Secondary | ICD-10-CM

## 2023-11-25 DIAGNOSIS — C3411 Malignant neoplasm of upper lobe, right bronchus or lung: Secondary | ICD-10-CM | POA: Diagnosis not present

## 2023-11-25 DIAGNOSIS — C3490 Malignant neoplasm of unspecified part of unspecified bronchus or lung: Secondary | ICD-10-CM

## 2023-11-25 DIAGNOSIS — G893 Neoplasm related pain (acute) (chronic): Secondary | ICD-10-CM

## 2023-11-25 DIAGNOSIS — Z923 Personal history of irradiation: Secondary | ICD-10-CM | POA: Insufficient documentation

## 2023-11-25 DIAGNOSIS — R530 Neoplastic (malignant) related fatigue: Secondary | ICD-10-CM

## 2023-11-25 LAB — CBC WITH DIFFERENTIAL (CANCER CENTER ONLY)
Abs Immature Granulocytes: 0.03 K/uL (ref 0.00–0.07)
Basophils Absolute: 0.1 K/uL (ref 0.0–0.1)
Basophils Relative: 1 %
Eosinophils Absolute: 0.1 K/uL (ref 0.0–0.5)
Eosinophils Relative: 1 %
HCT: 36.9 % (ref 36.0–46.0)
Hemoglobin: 12.1 g/dL (ref 12.0–15.0)
Immature Granulocytes: 0 %
Lymphocytes Relative: 23 %
Lymphs Abs: 2.3 K/uL (ref 0.7–4.0)
MCH: 29.6 pg (ref 26.0–34.0)
MCHC: 32.8 g/dL (ref 30.0–36.0)
MCV: 90.2 fL (ref 80.0–100.0)
Monocytes Absolute: 0.9 K/uL (ref 0.1–1.0)
Monocytes Relative: 10 %
Neutro Abs: 6.4 K/uL (ref 1.7–7.7)
Neutrophils Relative %: 65 %
Platelet Count: 404 K/uL — ABNORMAL HIGH (ref 150–400)
RBC: 4.09 MIL/uL (ref 3.87–5.11)
RDW: 13.3 % (ref 11.5–15.5)
WBC Count: 9.8 K/uL (ref 4.0–10.5)
nRBC: 0 % (ref 0.0–0.2)

## 2023-11-25 LAB — CMP (CANCER CENTER ONLY)
ALT: 7 U/L (ref 0–44)
AST: 13 U/L — ABNORMAL LOW (ref 15–41)
Albumin: 3.8 g/dL (ref 3.5–5.0)
Alkaline Phosphatase: 84 U/L (ref 38–126)
Anion gap: 6 (ref 5–15)
BUN: 16 mg/dL (ref 8–23)
CO2: 26 mmol/L (ref 22–32)
Calcium: 9.5 mg/dL (ref 8.9–10.3)
Chloride: 107 mmol/L (ref 98–111)
Creatinine: 0.9 mg/dL (ref 0.44–1.00)
GFR, Estimated: >60 mL/min (ref >60)
Glucose, Bld: 98 mg/dL (ref 70–99)
Potassium: 4.4 mmol/L (ref 3.5–5.1)
Sodium: 139 mmol/L (ref 135–145)
Total Bilirubin: 0.4 mg/dL (ref 0.0–1.2)
Total Protein: 6.9 g/dL (ref 6.5–8.1)

## 2023-11-25 MED ORDER — PROCHLORPERAZINE MALEATE 10 MG PO TABS
10.0000 mg | ORAL_TABLET | Freq: Four times a day (QID) | ORAL | 3 refills | Status: AC | PRN
Start: 2023-11-25 — End: ?

## 2023-11-25 NOTE — Progress Notes (Signed)
 Palliative Medicine [facility]  [phone] [phone]   Name: Danielle [name] Date: 11/25/2023 MRN: [id number]  DOB: 1950/05/15  Patient Care Team: Danielle Greig BRAVO, MD as PCP - General Danielle Sherrod, MD as Consulting Physician (Oncology) Pickenpack-Cousar, Fannie SAILOR, NP as Nurse Practitioner (Nurse Practitioner) [facility], [facility], [place]   INTERVAL HISTORY: Danielle [name] is a 74 y.o. 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 Past medical history also includes COPD, diabetes mellitus, insomnia, anemia, as well as a cerebral infarct(10/2014). Palliative ask to see for symptom and pain management and goals of care.   SOCIAL HISTORY:     reports that she quit smoking about 18 years ago. Her smoking use included cigarettes. She started smoking about 48 years ago. She has a 60 pack-year smoking history. She has never used smokeless tobacco. She reports that she does not drink alcohol and does not use drugs.  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 had chemo and radiation for lung ca   Thrush 12/11/2010    ALLERGIES:  is allergic to contrast media [iodinated contrast media], iohexol , sulfa drugs cross reactors, and co-trimoxazole injection  [sulfamethoxazole-trimethoprim].  MEDICATIONS:  Current Outpatient Medications  Medication Sig Dispense Refill   albuterol  (VENTOLIN  HFA) 108 (90 Base) MCG/ACT inhaler TAKE 2 PUFFS BY MOUTH EVERY 6 HOURS AS NEEDED FOR WHEEZE OR SHORTNESS OF BREATH 18 each 5   amLODipine  (NORVASC ) 10 MG tablet TAKE 1 TABLET BY MOUTH EVERY DAY 90 tablet 1   aspirin  325 MG EC tablet TAKE 1 TABLET BY MOUTH EVERY DAY 90 tablet 0   atorvastatin  (LIPITOR) 40 MG tablet TAKE 1 TABLET BY MOUTH EVERY DAY AT 6 PM 90 tablet 3   Cholecalciferol (VITAMIN D3) 50 MCG (2000 UT) TABS Take by mouth.     diphenhydrAMINE  (BENADRYL ) 25 MG tablet Take 1 tablet (25 mg total) by mouth as directed. 2 hours before CT scan. 5 tablet 0   FeFum-FePoly-FA-B Cmp-C-Biot (FOLIVANE-PLUS) CAPS TAKE 1 CAPSULE BY MOUTH EVERY DAY IN THE MORNING 90 capsule 3   fluticasone  (FLONASE ) 50 MCG/ACT nasal spray Place 1-2 sprays into both nostrils daily as needed. 16 g 5   INCRUSE ELLIPTA  62.5 MCG/ACT AEPB INHALE 1 PUFF BY MOUTH EVERY DAY 30 each 5   lidocaine -prilocaine  (EMLA ) cream APPLY TO AFFECTED AREA FOR 1 DOSE 30 g 2   lisinopril  (ZESTRIL ) 20 MG tablet TAKE 2 TABLETS (40 MG TOTAL) BY MOUTH DAILY. 180 tablet 1   morphine  (MSIR) 30 MG tablet Take 1 tablet (30 mg total) by mouth every 4 (four) hours as needed for severe pain (pain score 7-10). 90 tablet 0   Olopatadine  HCl 0.2 % SOLN INSTILL 1 DROP INTO AFFECTED  EYE(S) EVERY DAY 2.5 mL 1   ondansetron  (ZOFRAN ) 8 MG tablet Take 1 tablet (8 mg total) by mouth every 8 (eight) hours as needed for nausea or vomiting. 30 tablet 3   [START ON 11/29/2023] oxyCODONE  ER (XTAMPZA  ER) 18 MG C12A Take 18 mg by mouth every 8 (eight) hours. 90 capsule 0   predniSONE  (DELTASONE ) 50 MG tablet 1 tablet by mouth 13 hours ,7 hours and 1 hour  before CT scan 3 tablet 12   prochlorperazine  (COMPAZINE ) 10 MG tablet Take 1 tablet (10 mg total) by mouth every 6 (six) hours as needed. 30 tablet 3   triamcinolone  (KENALOG ) 0.025 %  ointment APPLY TO AFFECTED AREA TWICE A DAY 30 g 0   Vitamin D , Ergocalciferol , (DRISDOL ) 1.25 MG (50000 UNIT) CAPS capsule Take 1 capsule (50,000 Units total) by mouth every 7 (seven) days. 12 capsule 1   No current facility-administered medications for this visit.    VITAL SIGNS: There were no vitals taken for this visit. There were no vitals filed for this visit.  Estimated body mass index is 26.85 kg/m as calculated from the following:   Height as of 08/19/23: 5' 2.25 (1.581 m).   Weight as of an earlier encounter on 11/25/23: 148 lb (67.1 kg).   PERFORMANCE STATUS (ECOG) : 1 - Symptomatic but completely ambulatory  Assessment NAD, in wheelchair  RRR Normal breathing pattern Left temporal laceration AAO x3  Discussed the use of AI scribe software for clinical note transcription with the patient, who gave verbal consent to proceed.  History of Present Illness Danielle [name] is a 73 year old female with metastatic non-small cell lung cancer who presented to clinic today for symptom management follow-up. No acute distress. She is accompanied by her daughter today.   Patient seen by Oncology team today after being lost to follow-up for over year. Discussed importance of regular follow-up. Patient and family verbalized understanding.   She has requested medication for nausea. Her appetite is good, and she has gained a couple of pounds.  [name] reports her pain is well controlled on current regimen. She is tolerating Xtampza  18mg  every 8 hours and MS IR as needed for breakthrough pain. Does not require breakthrough around the clock. Reports pain worsens particularly when she walks more or is most active. No adjustments to regimen at this time.   All questions answered and support provided.   Assessment & Plan Malignant neoplasm of lung Chronic condition with ongoing management. -Emphasized importance of having port flushed per protocol and follow-up with Oncology.    Nausea  Nausea managed with Compazine .  Cancer Related Pain Stable on Xtampza . No issues with constipation or diarrhea. No need for antiemetics at this time. -Continue Xtampza  18mg  every 8 hours as prescribed. -Continu MS IR 30mg  every 4 hours as needed for breakthrough pain  Follow-up Patient to be seen in 6-8 weeks via telephone.   Patient expressed understanding and was in agreement with this plan. She also understands that She can call the clinic at any time with any questions, concerns, or complaints.   Any controlled substances utilized were prescribed in the context of palliative care. PDMP has been reviewed.   Visit consisted of counseling and education dealing with the complex and emotionally intense issues of symptom management and palliative care in the setting of serious and potentially life-threatening illness.  Levon Borer, AGPCNP-BC  Palliative Medicine Team/[facility]

## 2023-11-26 ENCOUNTER — Inpatient Hospital Stay

## 2023-12-02 ENCOUNTER — Inpatient Hospital Stay

## 2023-12-25 ENCOUNTER — Other Ambulatory Visit: Payer: Self-pay

## 2023-12-25 DIAGNOSIS — C3490 Malignant neoplasm of unspecified part of unspecified bronchus or lung: Secondary | ICD-10-CM

## 2023-12-25 DIAGNOSIS — G893 Neoplasm related pain (acute) (chronic): Secondary | ICD-10-CM

## 2023-12-25 DIAGNOSIS — C3410 Malignant neoplasm of upper lobe, unspecified bronchus or lung: Secondary | ICD-10-CM

## 2023-12-25 DIAGNOSIS — Z515 Encounter for palliative care: Secondary | ICD-10-CM

## 2023-12-25 MED ORDER — XTAMPZA ER 18 MG PO C12A: 18.0000 mg | EXTENDED_RELEASE_CAPSULE | Freq: Three times a day (TID) | ORAL | 0 refills | Status: DC

## 2024-01-06 ENCOUNTER — Inpatient Hospital Stay: Admitting: Nurse Practitioner

## 2024-01-06 ENCOUNTER — Inpatient Hospital Stay: Attending: Physician Assistant

## 2024-01-06 ENCOUNTER — Encounter: Payer: Self-pay | Admitting: Nurse Practitioner

## 2024-01-06 VITALS — BP 147/55 | HR 83 | Temp 98.0°F | Resp 18 | Wt 152.9 lb

## 2024-01-06 DIAGNOSIS — G893 Neoplasm related pain (acute) (chronic): Secondary | ICD-10-CM | POA: Diagnosis not present

## 2024-01-06 DIAGNOSIS — C7931 Secondary malignant neoplasm of brain: Secondary | ICD-10-CM | POA: Diagnosis not present

## 2024-01-06 DIAGNOSIS — R530 Neoplastic (malignant) related fatigue: Secondary | ICD-10-CM

## 2024-01-06 DIAGNOSIS — Z515 Encounter for palliative care: Secondary | ICD-10-CM

## 2024-01-06 DIAGNOSIS — C3490 Malignant neoplasm of unspecified part of unspecified bronchus or lung: Secondary | ICD-10-CM

## 2024-01-06 DIAGNOSIS — Z452 Encounter for adjustment and management of vascular access device: Secondary | ICD-10-CM | POA: Diagnosis present

## 2024-01-06 DIAGNOSIS — C3410 Malignant neoplasm of upper lobe, unspecified bronchus or lung: Secondary | ICD-10-CM

## 2024-01-06 DIAGNOSIS — R110 Nausea: Secondary | ICD-10-CM

## 2024-01-06 MED ORDER — MORPHINE SULFATE 30 MG PO TABS: 30.0000 mg | ORAL_TABLET | ORAL | 0 refills | Status: AC | PRN

## 2024-01-06 MED ORDER — LIDOCAINE-PRILOCAINE 2.5-2.5 % EX CREA: TOPICAL_CREAM | CUTANEOUS | 4 refills | Status: AC

## 2024-01-06 NOTE — Progress Notes (Signed)
 "    Palliative Medicine [name]  Telephone:([phone]) [phone] Fax:([phone]) [phone]   Name: Danielle [name] [name] Date: 01/06/2024 MRN: [id number]  DOB: [birth year]  Patient Care Team: Danielle Greig BRAVO, MD as PCP - General Danielle Sherrod, MD as Consulting Physician (Oncology) Danielle [name] [name], Danielle SAILOR, NP as Nurse Practitioner (Nurse Practitioner) Danielle [name] [name], [facility], Danielle [name] [name]   INTERVAL HISTORY: Danielle [name] [name] is a 73 y.o. 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 Past medical history also includes COPD, diabetes mellitus, insomnia, anemia, as well as a cerebral infarct(10/2014). Palliative ask to see for symptom and pain management and goals of care.   SOCIAL HISTORY:     reports that she quit smoking about 18 years ago. Her smoking use included cigarettes. She started smoking about 48 years ago. She has a 60 pack-year smoking history. She has never used smokeless tobacco. She reports that she does not drink alcohol and does not use drugs.  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 had chemo and radiation for lung ca   Thrush 12/11/2010    ALLERGIES:  is allergic to contrast media [iodinated contrast media], iohexol , sulfa drugs cross reactors, and co-trimoxazole injection  [sulfamethoxazole-trimethoprim].  MEDICATIONS:  Current Outpatient Medications  Medication Sig Dispense Refill   albuterol  (VENTOLIN  HFA) 108 (90 Base) MCG/ACT inhaler TAKE 2 PUFFS BY MOUTH EVERY 6 HOURS AS NEEDED FOR WHEEZE OR SHORTNESS OF BREATH 18 each 5   amLODipine  (NORVASC ) 10 MG tablet TAKE 1 TABLET BY MOUTH EVERY DAY 90 tablet 1   aspirin  325 MG EC tablet TAKE 1 TABLET BY MOUTH EVERY DAY 90 tablet 0   atorvastatin  (LIPITOR) 40 MG tablet TAKE 1 TABLET BY MOUTH EVERY DAY AT 6 PM 90 tablet 3   Cholecalciferol (VITAMIN D3) 50 MCG (2000 UT) TABS Take by mouth.     diphenhydrAMINE  (BENADRYL ) 25 MG tablet Take 1 tablet (25 mg total) by mouth as directed. 2 hours before CT scan. 5 tablet 0   FeFum-FePoly-FA-B Cmp-C-Biot (FOLIVANE-PLUS) CAPS TAKE 1 CAPSULE BY MOUTH EVERY DAY IN [facility]le [name] MORNING 90 capsule 3   fluticasone  (FLONASE ) 50 MCG/ACT nasal spray Place 1-2 sprays into both nostrils daily as needed. 16 g 5   INCRUSE ELLIPTA 62.5 MCG/ACT AEPB INHALE 1 PUFF BY MOUTH EVERY DAY 30 each 5   lidocaine -prilocaine  (EMLA ) cream APPLY TO AFFECTED AREA FOR 1 DOSE 30 g 4   lisinopril  (ZESTRIL ) 20 MG tablet TAKE 2 TABLETS (40 MG TOTAL) BY MOUTH DAILY. 180 tablet 1   morphine  (MSIR) 30 MG tablet Take 1 tablet (30 mg total) by mouth every 4 (four) hours as needed for severe pain (pain score 7-10). 90 tablet 0   Olopatadine  HCl 0.2 % SOLN INSTILL 1 DROP INTO  AFFECTED EYE(S) EVERY DAY 2.5 mL 1   ondansetron  (ZOFRAN ) 8 MG tablet Take 1 tablet (8 mg total) by mouth every 8 (eight) hours as needed for nausea or vomiting. 30 tablet 3   oxyCODONE  ER (XTAMPZA  ER) 18 MG C12A Take 18 mg by mouth every 8 (eight) hours. 90 capsule 0   predniSONE  (DELTASONE ) 50 MG tablet 1 tablet by mouth 13 hours ,7 hours and 1 hour  before CT scan 3 tablet 12   prochlorperazine  (COMPAZINE ) 10 MG tablet Take 1 tablet (10 mg total) by mouth every 6 (six) hours as needed. 30 tablet 3   triamcinolone  (KENALOG ) 0.025 % ointment APPLY TO  AFFECTED AREA TWICE A DAY 30 g 0   Vitamin D , Ergocalciferol , (DRISDOL ) 1.25 MG (50000 UNIT) CAPS capsule Take 1 capsule (50,000 Units total) by mouth every 7 (seven) days. 12 capsule 1   No current facility-administered medications for this visit.    VITAL SIGNS: BP (!) 147/55 Comment: re-check  Pulse 83   Temp 98 F (36.7 C)   Resp 18   Wt 152 lb 14.4 oz (69.4 kg)   SpO2 95%   BMI 27.74 kg/m  Filed Weights   01/06/24 1248  Weight: 152 lb 14.4 oz (69.4 kg)    Estimated body mass index is 27.74 kg/m as calculated from Danielle [name] following:   Height as of 08/19/23: 5' 2.25 (1.581 m).   Weight as of this encounter: 152 lb 14.4 oz (69.4 kg).   PERFORMANCE STATUS (ECOG) : 1 - Symptomatic but completely ambulatory  Assessment NAD, in wheelchair  RRR Normal breathing pattern Left temporal laceration AAO x3  Discussed Danielle [name] use of AI scribe software for clinical note transcription with Danielle [name] patient, who gave verbal consent to proceed.  History of Present Illness Danielle [name] [name] is a 73 year old female with metastatic non-small cell lung cancer who presented to clinic today for symptom management follow-up. No acute distress. She is accompanied by her daughter today.   Denies concerns of uncontrolled nausea, vomiting, constipation, or diarrhea. Her appetite is good. Weight is up to 152lbs.   Danielle [name] [name] reports her pain is well controlled on current regimen. She is tolerating Xtampza  18mg  every 8 hours and MS IR as needed for breakthrough pain. Does not require breakthrough around Danielle [name] clock. Reports pain worsens particularly when she walks more or is most active. No adjustments to regimen at this time.   All questions answered and support provided.   Assessment & Plan Malignant neoplasm of lung Chronic condition with ongoing management. -Emphasized importance of having port flushed per protocol and follow-up with Oncology.   Nausea  Nausea managed with Compazine .  Cancer Related  Pain Stable on Xtampza . No issues with constipation or diarrhea. No need for antiemetics at this time. -Continue Xtampza  18mg  every 8 hours as prescribed. -Continu MS IR 30mg  every 4 hours as needed for breakthrough pain  Follow-up Patient to be seen in 6-8 weeks via telephone.   Patient expressed understanding and was in agreement with this plan. She also understands that She can call [name] clinic at any time with any questions, concerns, or complaints.   Any controlled substances utilized were prescribed in Danielle [name] context of palliative care. PDMP has been reviewed.   I personally spent a total of 30 minutes in Danielle [name] care of Danielle [name] patient today including preparing to see Danielle [name] patient, getting/reviewing separately obtained history, performing a medically appropriate exam/evaluation, counseling and educating, and coordinating care. Visit consisted of counseling  and education dealing with Danielle [name] complex and emotionally intense issues of symptom management and palliative care in Danielle [name] setting of serious and potentially life-threatening illness.  Levon Borer, AGPCNP-BC  Palliative Medicine Team/[name]  "

## 2024-01-13 ENCOUNTER — Other Ambulatory Visit: Payer: Self-pay | Admitting: Family Medicine

## 2024-01-26 ENCOUNTER — Other Ambulatory Visit: Payer: Self-pay | Admitting: Nurse Practitioner

## 2024-01-26 DIAGNOSIS — Z515 Encounter for palliative care: Secondary | ICD-10-CM

## 2024-01-26 DIAGNOSIS — G893 Neoplasm related pain (acute) (chronic): Secondary | ICD-10-CM

## 2024-01-26 DIAGNOSIS — C3410 Malignant neoplasm of upper lobe, unspecified bronchus or lung: Secondary | ICD-10-CM

## 2024-01-26 DIAGNOSIS — C3490 Malignant neoplasm of unspecified part of unspecified bronchus or lung: Secondary | ICD-10-CM

## 2024-01-26 MED ORDER — XTAMPZA ER 18 MG PO C12A: 18.0000 mg | EXTENDED_RELEASE_CAPSULE | Freq: Three times a day (TID) | ORAL | 0 refills | Status: AC

## 2024-02-04 ENCOUNTER — Encounter: Payer: Self-pay | Admitting: Internal Medicine

## 2024-02-10 ENCOUNTER — Telehealth: Payer: Self-pay

## 2024-02-10 ENCOUNTER — Encounter: Payer: Self-pay | Admitting: Internal Medicine

## 2024-02-10 ENCOUNTER — Other Ambulatory Visit (HOSPITAL_COMMUNITY): Payer: Self-pay

## 2024-02-10 NOTE — Telephone Encounter (Signed)
 Pt called the clinic reporting that her new insurance does not cover her xtampza . RN reached out to PA team to see if medication can be covered before having to change to anther medication. RN made pt aware that she will be updated as updates are available. At time of call pt did not need a refill of her pain medication. No further needs at this time.

## 2024-02-10 NOTE — Telephone Encounter (Signed)
 Oral Oncology Patient Advocate Encounter   Received notification that prior authorization for oxyCODONE  ER (XTAMPZA  ER) 18 MG  is required.   PA submitted on 02/10/24 Key [id number] Status is pending     Lucie Lamer, CPhT [place]  [facility] Oncology Pharmacy Patient Advocate Specialist II [place] Phone: [phone]  Fax: [phone] [email or web address]

## 2024-02-13 ENCOUNTER — Telehealth: Payer: Self-pay | Admitting: Internal Medicine

## 2024-02-17 ENCOUNTER — Inpatient Hospital Stay: Attending: Physician Assistant

## 2024-02-24 ENCOUNTER — Ambulatory Visit: Payer: 59

## 2024-03-30 ENCOUNTER — Inpatient Hospital Stay: Attending: Physician Assistant

## 2024-05-24 ENCOUNTER — Inpatient Hospital Stay: Attending: Physician Assistant

## 2024-06-01 ENCOUNTER — Inpatient Hospital Stay: Admitting: Internal Medicine

## 2024-08-12 ENCOUNTER — Other Ambulatory Visit

## 2024-08-19 ENCOUNTER — Encounter: Admitting: Family Medicine
